# Patient Record
Sex: Female | Born: 1935
Health system: Southern US, Community
[De-identification: ages and names within clinical notes are randomized; demographics above are authoritative.]

## PROBLEM LIST (undated history)

## (undated) DIAGNOSIS — K449 Diaphragmatic hernia without obstruction or gangrene: Secondary | ICD-10-CM

## (undated) DIAGNOSIS — F0280 Dementia in other diseases classified elsewhere without behavioral disturbance: Secondary | ICD-10-CM

## (undated) DIAGNOSIS — E7800 Pure hypercholesterolemia, unspecified: Secondary | ICD-10-CM

## (undated) DIAGNOSIS — D649 Anemia, unspecified: Secondary | ICD-10-CM

## (undated) DIAGNOSIS — D693 Immune thrombocytopenic purpura: Secondary | ICD-10-CM

## (undated) DIAGNOSIS — M810 Age-related osteoporosis without current pathological fracture: Secondary | ICD-10-CM

## (undated) DIAGNOSIS — G8929 Other chronic pain: Secondary | ICD-10-CM

## (undated) DIAGNOSIS — K219 Gastro-esophageal reflux disease without esophagitis: Secondary | ICD-10-CM

## (undated) DIAGNOSIS — I447 Left bundle-branch block, unspecified: Secondary | ICD-10-CM

## (undated) DIAGNOSIS — E611 Iron deficiency: Secondary | ICD-10-CM

## (undated) DIAGNOSIS — M1990 Unspecified osteoarthritis, unspecified site: Secondary | ICD-10-CM

## (undated) DIAGNOSIS — I2699 Other pulmonary embolism without acute cor pulmonale: Secondary | ICD-10-CM

## (undated) DIAGNOSIS — F32A Depression, unspecified: Secondary | ICD-10-CM

## (undated) DIAGNOSIS — J449 Chronic obstructive pulmonary disease, unspecified: Secondary | ICD-10-CM

## (undated) DIAGNOSIS — F329 Major depressive disorder, single episode, unspecified: Secondary | ICD-10-CM

## (undated) DIAGNOSIS — F419 Anxiety disorder, unspecified: Secondary | ICD-10-CM

## (undated) DIAGNOSIS — G2581 Restless legs syndrome: Secondary | ICD-10-CM

## (undated) DIAGNOSIS — J45909 Unspecified asthma, uncomplicated: Secondary | ICD-10-CM

## (undated) DIAGNOSIS — M549 Dorsalgia, unspecified: Secondary | ICD-10-CM

## (undated) DIAGNOSIS — G309 Alzheimer's disease, unspecified: Secondary | ICD-10-CM

## (undated) DIAGNOSIS — E039 Hypothyroidism, unspecified: Secondary | ICD-10-CM

## (undated) DIAGNOSIS — R41841 Cognitive communication deficit: Secondary | ICD-10-CM

## (undated) HISTORY — PX: LUMBAR DISC SURGERY: SHX700

## (undated) HISTORY — DX: Dorsalgia, unspecified: M54.9

## (undated) HISTORY — DX: Restless legs syndrome: G25.81

## (undated) HISTORY — DX: Depression, unspecified: F32.A

## (undated) HISTORY — DX: Pure hypercholesterolemia, unspecified: E78.00

## (undated) HISTORY — DX: Unspecified osteoarthritis, unspecified site: M19.90

## (undated) HISTORY — PX: TOTAL ABDOMINAL HYSTERECTOMY: SHX209

## (undated) HISTORY — DX: Other pulmonary embolism without acute cor pulmonale: I26.99

## (undated) HISTORY — PX: CARPAL TUNNEL RELEASE: SHX101

## (undated) HISTORY — DX: Other chronic pain: G89.29

## (undated) HISTORY — DX: Immune thrombocytopenic purpura: D69.3

## (undated) HISTORY — DX: Anxiety disorder, unspecified: F41.9

## (undated) HISTORY — DX: Diaphragmatic hernia without obstruction or gangrene: K44.9

## (undated) HISTORY — DX: Anemia, unspecified: D64.9

## (undated) HISTORY — PX: SPLENECTOMY, PARTIAL: SHX787

## (undated) HISTORY — DX: Age-related osteoporosis without current pathological fracture: M81.0

## (undated) HISTORY — DX: Unspecified asthma, uncomplicated: J45.909

## (undated) HISTORY — DX: Gastro-esophageal reflux disease without esophagitis: K21.9

## (undated) HISTORY — DX: Major depressive disorder, single episode, unspecified: F32.9

---

## 2004-01-06 ENCOUNTER — Other Ambulatory Visit: Payer: Self-pay

## 2004-01-20 ENCOUNTER — Other Ambulatory Visit: Payer: Self-pay

## 2004-01-29 ENCOUNTER — Ambulatory Visit (HOSPITAL_COMMUNITY): Admission: RE | Admit: 2004-01-29 | Discharge: 2004-01-29 | Payer: Self-pay | Admitting: Allergy and Immunology

## 2004-07-12 ENCOUNTER — Ambulatory Visit: Payer: Self-pay | Admitting: Internal Medicine

## 2004-08-21 ENCOUNTER — Inpatient Hospital Stay (HOSPITAL_COMMUNITY): Admission: EM | Admit: 2004-08-21 | Discharge: 2004-08-27 | Payer: Self-pay | Admitting: Emergency Medicine

## 2004-09-03 ENCOUNTER — Ambulatory Visit: Payer: Self-pay | Admitting: Internal Medicine

## 2004-09-11 ENCOUNTER — Ambulatory Visit: Payer: Self-pay | Admitting: Internal Medicine

## 2004-10-07 ENCOUNTER — Ambulatory Visit: Payer: Self-pay | Admitting: Internal Medicine

## 2004-11-05 ENCOUNTER — Ambulatory Visit: Payer: Self-pay | Admitting: Internal Medicine

## 2004-11-12 ENCOUNTER — Ambulatory Visit: Payer: Self-pay | Admitting: Internal Medicine

## 2004-12-31 ENCOUNTER — Ambulatory Visit: Payer: Self-pay | Admitting: Internal Medicine

## 2005-01-10 ENCOUNTER — Ambulatory Visit: Payer: Self-pay | Admitting: Internal Medicine

## 2005-03-04 ENCOUNTER — Ambulatory Visit: Payer: Self-pay | Admitting: Internal Medicine

## 2005-03-12 ENCOUNTER — Ambulatory Visit: Payer: Self-pay | Admitting: Internal Medicine

## 2005-04-11 ENCOUNTER — Ambulatory Visit: Payer: Self-pay | Admitting: Internal Medicine

## 2005-05-28 ENCOUNTER — Encounter: Admission: RE | Admit: 2005-05-28 | Discharge: 2005-05-28 | Payer: Self-pay | Admitting: Allergy and Immunology

## 2005-07-06 ENCOUNTER — Ambulatory Visit: Payer: Self-pay | Admitting: Internal Medicine

## 2005-07-12 ENCOUNTER — Ambulatory Visit: Payer: Self-pay | Admitting: Internal Medicine

## 2005-09-02 ENCOUNTER — Ambulatory Visit: Payer: Self-pay | Admitting: Internal Medicine

## 2005-09-11 ENCOUNTER — Ambulatory Visit: Payer: Self-pay | Admitting: Internal Medicine

## 2005-11-04 ENCOUNTER — Ambulatory Visit: Payer: Self-pay | Admitting: Internal Medicine

## 2005-11-12 ENCOUNTER — Ambulatory Visit: Payer: Self-pay | Admitting: Internal Medicine

## 2005-12-10 ENCOUNTER — Ambulatory Visit: Payer: Self-pay | Admitting: Internal Medicine

## 2006-01-10 ENCOUNTER — Ambulatory Visit: Payer: Self-pay | Admitting: Internal Medicine

## 2006-02-09 ENCOUNTER — Ambulatory Visit: Payer: Self-pay | Admitting: Internal Medicine

## 2006-03-12 ENCOUNTER — Ambulatory Visit: Payer: Self-pay | Admitting: Internal Medicine

## 2006-04-01 ENCOUNTER — Ambulatory Visit: Payer: Self-pay | Admitting: Internal Medicine

## 2006-04-11 ENCOUNTER — Ambulatory Visit: Payer: Self-pay | Admitting: Internal Medicine

## 2006-04-30 ENCOUNTER — Emergency Department: Payer: Self-pay | Admitting: General Practice

## 2006-05-12 ENCOUNTER — Ambulatory Visit: Payer: Self-pay | Admitting: Internal Medicine

## 2006-05-27 ENCOUNTER — Inpatient Hospital Stay: Payer: Self-pay | Admitting: Internal Medicine

## 2006-06-03 ENCOUNTER — Ambulatory Visit: Payer: Self-pay | Admitting: General Surgery

## 2006-06-04 ENCOUNTER — Inpatient Hospital Stay: Payer: Self-pay | Admitting: Internal Medicine

## 2006-06-07 ENCOUNTER — Other Ambulatory Visit: Payer: Self-pay

## 2006-06-08 ENCOUNTER — Inpatient Hospital Stay: Payer: Self-pay | Admitting: Internal Medicine

## 2006-06-12 ENCOUNTER — Ambulatory Visit: Payer: Self-pay | Admitting: Internal Medicine

## 2006-07-07 ENCOUNTER — Ambulatory Visit: Payer: Self-pay

## 2006-07-12 ENCOUNTER — Ambulatory Visit: Payer: Self-pay | Admitting: Internal Medicine

## 2006-08-12 ENCOUNTER — Ambulatory Visit: Payer: Self-pay | Admitting: Internal Medicine

## 2006-09-09 ENCOUNTER — Emergency Department: Payer: Self-pay | Admitting: Emergency Medicine

## 2006-09-11 ENCOUNTER — Ambulatory Visit: Payer: Self-pay | Admitting: Internal Medicine

## 2006-09-16 ENCOUNTER — Inpatient Hospital Stay: Payer: Self-pay | Admitting: Unknown Physician Specialty

## 2006-10-12 ENCOUNTER — Ambulatory Visit: Payer: Self-pay | Admitting: Internal Medicine

## 2006-11-12 ENCOUNTER — Ambulatory Visit: Payer: Self-pay | Admitting: Internal Medicine

## 2006-12-11 ENCOUNTER — Ambulatory Visit: Payer: Self-pay | Admitting: Internal Medicine

## 2007-01-11 ENCOUNTER — Ambulatory Visit: Payer: Self-pay | Admitting: Internal Medicine

## 2007-01-14 ENCOUNTER — Ambulatory Visit: Payer: Self-pay | Admitting: Unknown Physician Specialty

## 2007-01-24 ENCOUNTER — Ambulatory Visit: Payer: Self-pay | Admitting: Unknown Physician Specialty

## 2007-01-24 ENCOUNTER — Other Ambulatory Visit: Payer: Self-pay

## 2007-02-01 ENCOUNTER — Ambulatory Visit: Payer: Self-pay | Admitting: Unknown Physician Specialty

## 2007-02-10 ENCOUNTER — Ambulatory Visit: Payer: Self-pay | Admitting: Internal Medicine

## 2007-03-13 ENCOUNTER — Ambulatory Visit: Payer: Self-pay | Admitting: Internal Medicine

## 2007-04-12 ENCOUNTER — Ambulatory Visit: Payer: Self-pay | Admitting: Internal Medicine

## 2007-04-19 ENCOUNTER — Ambulatory Visit: Payer: Self-pay | Admitting: Ophthalmology

## 2007-04-26 ENCOUNTER — Ambulatory Visit: Payer: Self-pay | Admitting: Ophthalmology

## 2007-05-13 ENCOUNTER — Ambulatory Visit: Payer: Self-pay | Admitting: Internal Medicine

## 2007-06-13 ENCOUNTER — Ambulatory Visit: Payer: Self-pay | Admitting: Internal Medicine

## 2007-07-13 ENCOUNTER — Ambulatory Visit: Payer: Self-pay | Admitting: Internal Medicine

## 2007-08-02 ENCOUNTER — Ambulatory Visit: Payer: Self-pay | Admitting: Internal Medicine

## 2007-08-12 ENCOUNTER — Other Ambulatory Visit: Payer: Self-pay

## 2007-08-12 ENCOUNTER — Emergency Department: Payer: Self-pay | Admitting: Emergency Medicine

## 2007-08-13 ENCOUNTER — Ambulatory Visit: Payer: Self-pay | Admitting: Internal Medicine

## 2007-09-03 ENCOUNTER — Ambulatory Visit: Payer: Self-pay | Admitting: Unknown Physician Specialty

## 2007-09-12 ENCOUNTER — Ambulatory Visit: Payer: Self-pay | Admitting: Internal Medicine

## 2007-10-13 ENCOUNTER — Ambulatory Visit: Payer: Self-pay | Admitting: Internal Medicine

## 2007-11-13 ENCOUNTER — Ambulatory Visit: Payer: Self-pay | Admitting: Internal Medicine

## 2007-12-11 ENCOUNTER — Ambulatory Visit: Payer: Self-pay | Admitting: Internal Medicine

## 2008-01-11 ENCOUNTER — Ambulatory Visit: Payer: Self-pay | Admitting: Internal Medicine

## 2008-02-10 ENCOUNTER — Ambulatory Visit: Payer: Self-pay | Admitting: Internal Medicine

## 2008-03-12 ENCOUNTER — Ambulatory Visit: Payer: Self-pay | Admitting: Internal Medicine

## 2008-03-21 ENCOUNTER — Ambulatory Visit: Payer: Self-pay

## 2008-03-27 ENCOUNTER — Ambulatory Visit: Payer: Self-pay | Admitting: Internal Medicine

## 2008-04-11 ENCOUNTER — Ambulatory Visit: Payer: Self-pay | Admitting: Internal Medicine

## 2008-05-29 ENCOUNTER — Ambulatory Visit: Payer: Self-pay | Admitting: Internal Medicine

## 2008-06-12 ENCOUNTER — Ambulatory Visit: Payer: Self-pay | Admitting: Internal Medicine

## 2008-06-15 ENCOUNTER — Ambulatory Visit: Payer: Self-pay | Admitting: General Practice

## 2008-06-29 ENCOUNTER — Ambulatory Visit: Payer: Self-pay | Admitting: General Practice

## 2008-07-31 ENCOUNTER — Ambulatory Visit: Payer: Self-pay | Admitting: Internal Medicine

## 2008-08-03 ENCOUNTER — Ambulatory Visit: Payer: Self-pay | Admitting: Internal Medicine

## 2008-08-12 ENCOUNTER — Ambulatory Visit: Payer: Self-pay | Admitting: Internal Medicine

## 2008-09-06 ENCOUNTER — Inpatient Hospital Stay: Payer: Self-pay | Admitting: Internal Medicine

## 2008-09-11 ENCOUNTER — Ambulatory Visit: Payer: Self-pay | Admitting: Internal Medicine

## 2008-11-12 ENCOUNTER — Ambulatory Visit: Payer: Self-pay | Admitting: Internal Medicine

## 2008-11-27 ENCOUNTER — Ambulatory Visit: Payer: Self-pay | Admitting: Internal Medicine

## 2008-12-10 ENCOUNTER — Ambulatory Visit: Payer: Self-pay | Admitting: Internal Medicine

## 2009-04-11 ENCOUNTER — Ambulatory Visit: Payer: Self-pay | Admitting: Internal Medicine

## 2009-05-02 ENCOUNTER — Ambulatory Visit: Payer: Self-pay | Admitting: Internal Medicine

## 2009-05-12 ENCOUNTER — Ambulatory Visit: Payer: Self-pay | Admitting: Internal Medicine

## 2009-08-05 ENCOUNTER — Ambulatory Visit: Payer: Self-pay | Admitting: Internal Medicine

## 2010-04-11 ENCOUNTER — Ambulatory Visit: Payer: Self-pay | Admitting: Internal Medicine

## 2010-04-21 ENCOUNTER — Ambulatory Visit: Payer: Self-pay | Admitting: Internal Medicine

## 2010-05-12 ENCOUNTER — Ambulatory Visit: Payer: Self-pay | Admitting: Internal Medicine

## 2010-06-12 ENCOUNTER — Ambulatory Visit: Payer: Self-pay | Admitting: Internal Medicine

## 2010-07-12 ENCOUNTER — Ambulatory Visit: Payer: Self-pay | Admitting: Internal Medicine

## 2010-08-12 ENCOUNTER — Ambulatory Visit: Payer: Self-pay | Admitting: Internal Medicine

## 2010-08-29 ENCOUNTER — Ambulatory Visit: Payer: Self-pay | Admitting: Internal Medicine

## 2010-09-11 ENCOUNTER — Ambulatory Visit: Payer: Self-pay | Admitting: Internal Medicine

## 2010-10-12 ENCOUNTER — Ambulatory Visit: Payer: Self-pay | Admitting: Internal Medicine

## 2010-11-12 ENCOUNTER — Ambulatory Visit: Payer: Self-pay | Admitting: Internal Medicine

## 2010-12-11 ENCOUNTER — Ambulatory Visit: Payer: Self-pay | Admitting: Internal Medicine

## 2011-01-11 ENCOUNTER — Ambulatory Visit: Payer: Self-pay | Admitting: Internal Medicine

## 2011-02-10 ENCOUNTER — Ambulatory Visit: Payer: Self-pay | Admitting: Internal Medicine

## 2011-03-13 ENCOUNTER — Ambulatory Visit: Payer: Self-pay | Admitting: Internal Medicine

## 2011-04-01 ENCOUNTER — Ambulatory Visit: Payer: Self-pay | Admitting: Ophthalmology

## 2011-04-12 ENCOUNTER — Ambulatory Visit: Payer: Self-pay | Admitting: Internal Medicine

## 2011-04-20 ENCOUNTER — Ambulatory Visit: Payer: Self-pay | Admitting: Ophthalmology

## 2011-05-13 ENCOUNTER — Ambulatory Visit: Payer: Self-pay | Admitting: Internal Medicine

## 2011-06-13 ENCOUNTER — Ambulatory Visit: Payer: Self-pay | Admitting: Internal Medicine

## 2011-07-13 ENCOUNTER — Ambulatory Visit: Payer: Self-pay | Admitting: Internal Medicine

## 2011-08-13 ENCOUNTER — Ambulatory Visit: Payer: Self-pay | Admitting: Internal Medicine

## 2011-08-31 ENCOUNTER — Ambulatory Visit: Payer: Self-pay | Admitting: Internal Medicine

## 2011-09-12 ENCOUNTER — Ambulatory Visit: Payer: Self-pay | Admitting: Internal Medicine

## 2011-10-13 ENCOUNTER — Ambulatory Visit: Payer: Self-pay | Admitting: Internal Medicine

## 2011-10-19 LAB — CBC CANCER CENTER
Basophil #: 0 x10 3/mm (ref 0.0–0.1)
Basophil %: 0.6 %
Eosinophil #: 0.1 x10 3/mm (ref 0.0–0.7)
Eosinophil %: 1.7 %
HCT: 44.7 % (ref 35.0–47.0)
HGB: 15.1 g/dL (ref 12.0–16.0)
Lymphocyte #: 3.2 x10 3/mm (ref 1.0–3.6)
Lymphocyte %: 47.4 %
MCH: 31.5 pg (ref 26.0–34.0)
MCHC: 33.7 g/dL (ref 32.0–36.0)
MCV: 93 fL (ref 80–100)
Monocyte #: 0.6 x10 3/mm (ref 0.0–0.7)
Monocyte %: 8.6 %
Neutrophil #: 2.8 x10 3/mm (ref 1.4–6.5)
Neutrophil %: 41.7 %
Platelet: 103 x10 3/mm — ABNORMAL LOW (ref 150–440)
RBC: 4.8 10*6/uL (ref 3.80–5.20)
RDW: 14.1 % (ref 11.5–14.5)
WBC: 6.7 x10 3/mm (ref 3.6–11.0)

## 2011-11-13 ENCOUNTER — Ambulatory Visit: Payer: Self-pay | Admitting: Internal Medicine

## 2011-11-23 LAB — CANCER CTR PLATELET CT: Platelet: 93 x10 3/mm — ABNORMAL LOW (ref 150–440)

## 2011-11-25 ENCOUNTER — Ambulatory Visit
Admission: RE | Admit: 2011-11-25 | Discharge: 2011-11-25 | Disposition: A | Discharge status: Home / Self Care | Payer: Medicare Other | Source: Ambulatory Visit | Attending: Pediatrics | Admitting: Pediatrics

## 2011-11-25 ENCOUNTER — Other Ambulatory Visit: Payer: Self-pay | Admitting: Pediatrics

## 2011-12-07 LAB — CANCER CTR PLATELET CT: Platelet: 91 x10 3/mm — ABNORMAL LOW (ref 150–440)

## 2011-12-08 ENCOUNTER — Ambulatory Visit: Payer: Self-pay | Admitting: Cardiology

## 2011-12-11 ENCOUNTER — Ambulatory Visit: Payer: Self-pay | Admitting: Internal Medicine

## 2011-12-15 LAB — CANCER CTR PLATELET CT: Platelet: 104 x10 3/mm — ABNORMAL LOW (ref 150–440)

## 2012-01-11 ENCOUNTER — Ambulatory Visit: Payer: Self-pay | Admitting: Internal Medicine

## 2012-01-18 LAB — CANCER CTR PLATELET CT: Platelet: 44 x10 3/mm — ABNORMAL LOW (ref 150–440)

## 2012-01-19 LAB — CBC CANCER CENTER
Basophil #: 0.1 x10 3/mm (ref 0.0–0.1)
Basophil %: 0.6 %
Eosinophil #: 0 x10 3/mm (ref 0.0–0.7)
Eosinophil %: 0 %
HCT: 42.2 % (ref 35.0–47.0)
HGB: 14.1 g/dL (ref 12.0–16.0)
Lymphocyte #: 1.8 x10 3/mm (ref 1.0–3.6)
Lymphocyte %: 13.1 %
MCH: 31 pg (ref 26.0–34.0)
MCHC: 33.5 g/dL (ref 32.0–36.0)
MCV: 92 fL (ref 80–100)
Monocyte #: 0.5 x10 3/mm (ref 0.0–0.7)
Monocyte %: 3.3 %
Neutrophil #: 11.7 x10 3/mm — ABNORMAL HIGH (ref 1.4–6.5)
Neutrophil %: 83 %
Platelet: 56 x10 3/mm — ABNORMAL LOW (ref 150–440)
RBC: 4.56 10*6/uL (ref 3.80–5.20)
RDW: 14.7 % — ABNORMAL HIGH (ref 11.5–14.5)
WBC: 14.1 x10 3/mm — ABNORMAL HIGH (ref 3.6–11.0)

## 2012-01-21 LAB — CANCER CTR PLATELET CT: Platelet: 53 x10 3/mm — ABNORMAL LOW (ref 150–440)

## 2012-01-25 LAB — PLATELET COUNT: Platelet: 64 10*3/uL — ABNORMAL LOW (ref 150–440)

## 2012-01-28 LAB — PLATELET COUNT: Platelet: 87 10*3/uL — ABNORMAL LOW (ref 150–440)

## 2012-02-03 LAB — PLATELET COUNT: Platelet: 113 10*3/uL — ABNORMAL LOW (ref 150–440)

## 2012-02-10 ENCOUNTER — Ambulatory Visit: Payer: Self-pay | Admitting: Internal Medicine

## 2012-02-15 LAB — CANCER CTR PLATELET CT: Platelet: 80 x10 3/mm — ABNORMAL LOW (ref 150–440)

## 2012-03-09 LAB — CBC CANCER CENTER
Basophil #: 0.1 x10 3/mm (ref 0.0–0.1)
Basophil %: 1.5 %
Eosinophil #: 0.2 x10 3/mm (ref 0.0–0.7)
Eosinophil %: 4 %
HCT: 45.4 % (ref 35.0–47.0)
HGB: 14.8 g/dL (ref 12.0–16.0)
Lymphocyte #: 2.5 x10 3/mm (ref 1.0–3.6)
Lymphocyte %: 45.2 %
MCH: 30.4 pg (ref 26.0–34.0)
MCHC: 32.7 g/dL (ref 32.0–36.0)
MCV: 93 fL (ref 80–100)
Monocyte #: 0.5 x10 3/mm (ref 0.2–0.9)
Monocyte %: 9.7 %
Neutrophil #: 2.2 x10 3/mm (ref 1.4–6.5)
Neutrophil %: 39.6 %
Platelet: 90 x10 3/mm — ABNORMAL LOW (ref 150–440)
RBC: 4.89 10*6/uL (ref 3.80–5.20)
RDW: 14.2 % (ref 11.5–14.5)
WBC: 5.5 x10 3/mm (ref 3.6–11.0)

## 2012-03-12 ENCOUNTER — Ambulatory Visit: Payer: Self-pay | Admitting: Internal Medicine

## 2012-04-27 ENCOUNTER — Ambulatory Visit: Payer: Self-pay | Admitting: Internal Medicine

## 2012-04-27 LAB — CBC CANCER CENTER
Basophil #: 0.1 x10 3/mm (ref 0.0–0.1)
Basophil %: 0.9 %
Eosinophil #: 0.3 x10 3/mm (ref 0.0–0.7)
Eosinophil %: 3.6 %
HCT: 42.3 % (ref 35.0–47.0)
HGB: 13.5 g/dL (ref 12.0–16.0)
Lymphocyte #: 4.2 x10 3/mm — ABNORMAL HIGH (ref 1.0–3.6)
Lymphocyte %: 45.3 %
MCH: 29.8 pg (ref 26.0–34.0)
MCHC: 32 g/dL (ref 32.0–36.0)
MCV: 93 fL (ref 80–100)
Monocyte #: 0.7 x10 3/mm (ref 0.2–0.9)
Monocyte %: 7 %
Neutrophil #: 4 x10 3/mm (ref 1.4–6.5)
Neutrophil %: 43.2 %
Platelet: 96 x10 3/mm — ABNORMAL LOW (ref 150–440)
RBC: 4.54 10*6/uL (ref 3.80–5.20)
RDW: 14.4 % (ref 11.5–14.5)
WBC: 9.4 x10 3/mm (ref 3.6–11.0)

## 2012-05-11 LAB — CBC CANCER CENTER
Basophil #: 0.1 x10 3/mm (ref 0.0–0.1)
Basophil %: 0.6 %
Eosinophil #: 0.2 x10 3/mm (ref 0.0–0.7)
Eosinophil %: 1.6 %
HCT: 46 % (ref 35.0–47.0)
HGB: 15.3 g/dL (ref 12.0–16.0)
Lymphocyte #: 2.3 x10 3/mm (ref 1.0–3.6)
Lymphocyte %: 17.6 %
MCH: 31.1 pg (ref 26.0–34.0)
MCHC: 33.3 g/dL (ref 32.0–36.0)
MCV: 93 fL (ref 80–100)
Monocyte #: 0.7 x10 3/mm (ref 0.2–0.9)
Monocyte %: 5.4 %
Neutrophil #: 9.7 x10 3/mm — ABNORMAL HIGH (ref 1.4–6.5)
Neutrophil %: 74.8 %
Platelet: 71 x10 3/mm — ABNORMAL LOW (ref 150–440)
RBC: 4.93 10*6/uL (ref 3.80–5.20)
RDW: 14.8 % — ABNORMAL HIGH (ref 11.5–14.5)
WBC: 13 x10 3/mm — ABNORMAL HIGH (ref 3.6–11.0)

## 2012-05-12 ENCOUNTER — Ambulatory Visit: Payer: Self-pay | Admitting: Internal Medicine

## 2012-05-25 LAB — CANCER CTR PLATELET CT: Platelet: 114 x10 3/mm — ABNORMAL LOW (ref 150–440)

## 2012-06-12 ENCOUNTER — Ambulatory Visit: Payer: Self-pay | Admitting: Internal Medicine

## 2012-06-22 LAB — CANCER CENTER WBC: WBC: 8.7 x10 3/mm (ref 3.6–11.0)

## 2012-06-22 LAB — CANCER CTR PLATELET CT: Platelet: 57 x10 3/mm — ABNORMAL LOW (ref 150–440)

## 2012-07-12 ENCOUNTER — Ambulatory Visit: Payer: Self-pay | Admitting: Internal Medicine

## 2012-07-20 LAB — CANCER CTR PLATELET CT: Platelet: 72 x10 3/mm — ABNORMAL LOW (ref 150–440)

## 2012-08-12 ENCOUNTER — Ambulatory Visit: Payer: Self-pay | Admitting: Internal Medicine

## 2012-08-17 LAB — CANCER CTR PLATELET CT: Platelet: 66 x10 3/mm — ABNORMAL LOW (ref 150–440)

## 2012-08-31 ENCOUNTER — Ambulatory Visit: Payer: Self-pay | Admitting: Internal Medicine

## 2012-08-31 LAB — CANCER CTR PLATELET CT: Platelet: 91 x10 3/mm — ABNORMAL LOW (ref 150–440)

## 2012-09-11 ENCOUNTER — Ambulatory Visit: Payer: Self-pay | Admitting: Internal Medicine

## 2012-10-12 ENCOUNTER — Ambulatory Visit: Payer: Self-pay | Admitting: Internal Medicine

## 2012-11-12 ENCOUNTER — Ambulatory Visit: Payer: Self-pay | Admitting: Internal Medicine

## 2012-11-23 LAB — CBC CANCER CENTER
Basophil #: 0 x10 3/mm (ref 0.0–0.1)
Basophil %: 0.6 %
Eosinophil #: 0.1 x10 3/mm (ref 0.0–0.7)
Eosinophil %: 1.6 %
HCT: 44.8 % (ref 35.0–47.0)
HGB: 15 g/dL (ref 12.0–16.0)
Lymphocyte #: 3.6 x10 3/mm (ref 1.0–3.6)
Lymphocyte %: 47.3 %
MCH: 31.1 pg (ref 26.0–34.0)
MCHC: 33.5 g/dL (ref 32.0–36.0)
MCV: 93 fL (ref 80–100)
Monocyte #: 0.7 x10 3/mm (ref 0.2–0.9)
Monocyte %: 9.2 %
Neutrophil #: 3.1 x10 3/mm (ref 1.4–6.5)
Neutrophil %: 41.3 %
Platelet: 68 x10 3/mm — ABNORMAL LOW (ref 150–440)
RBC: 4.82 10*6/uL (ref 3.80–5.20)
RDW: 13.6 % (ref 11.5–14.5)
WBC: 7.6 x10 3/mm (ref 3.6–11.0)

## 2012-12-10 ENCOUNTER — Ambulatory Visit: Payer: Self-pay | Admitting: Internal Medicine

## 2012-12-26 LAB — CANCER CTR PLATELET CT: Platelet: 80 x10 3/mm — ABNORMAL LOW (ref 150–440)

## 2013-01-10 ENCOUNTER — Ambulatory Visit: Payer: Self-pay | Admitting: Internal Medicine

## 2013-01-13 ENCOUNTER — Emergency Department: Payer: Self-pay | Admitting: Emergency Medicine

## 2013-01-16 ENCOUNTER — Emergency Department: Payer: Self-pay | Admitting: Emergency Medicine

## 2013-01-18 LAB — CANCER CTR PLATELET CT: Platelet: 56 x10 3/mm — ABNORMAL LOW (ref 150–440)

## 2013-01-23 LAB — CANCER CTR PLATELET CT: Platelet: 104 x10 3/mm — ABNORMAL LOW (ref 150–440)

## 2013-02-09 ENCOUNTER — Ambulatory Visit: Payer: Self-pay | Admitting: Internal Medicine

## 2013-02-15 LAB — CANCER CTR PLATELET CT: Platelet: 42 x10 3/mm — ABNORMAL LOW (ref 150–440)

## 2013-02-17 LAB — CANCER CTR PLATELET CT: Platelet: 41 x10 3/mm — ABNORMAL LOW (ref 150–440)

## 2013-02-20 LAB — CBC CANCER CENTER
Basophil #: 0.1 x10 3/mm (ref 0.0–0.1)
Basophil %: 0.5 %
Eosinophil #: 0.2 x10 3/mm (ref 0.0–0.7)
Eosinophil %: 1.7 %
HCT: 38.6 % (ref 35.0–47.0)
HGB: 12.8 g/dL (ref 12.0–16.0)
Lymphocyte #: 4 x10 3/mm — ABNORMAL HIGH (ref 1.0–3.6)
Lymphocyte %: 37.7 %
MCH: 30.8 pg (ref 26.0–34.0)
MCHC: 33.3 g/dL (ref 32.0–36.0)
MCV: 93 fL (ref 80–100)
Monocyte #: 1 x10 3/mm — ABNORMAL HIGH (ref 0.2–0.9)
Monocyte %: 9 %
Neutrophil #: 5.5 x10 3/mm (ref 1.4–6.5)
Neutrophil %: 51.1 %
Platelet: 35 x10 3/mm — ABNORMAL LOW (ref 150–440)
RBC: 4.16 10*6/uL (ref 3.80–5.20)
RDW: 14 % (ref 11.5–14.5)
WBC: 10.7 x10 3/mm (ref 3.6–11.0)

## 2013-02-22 LAB — CBC CANCER CENTER
Basophil #: 0.1 x10 3/mm (ref 0.0–0.1)
Basophil %: 1.2 %
Eosinophil #: 0.2 x10 3/mm (ref 0.0–0.7)
Eosinophil %: 2.4 %
HCT: 42.4 % (ref 35.0–47.0)
HGB: 14.1 g/dL (ref 12.0–16.0)
Lymphocyte #: 2.5 x10 3/mm (ref 1.0–3.6)
Lymphocyte %: 38.3 %
MCH: 31 pg (ref 26.0–34.0)
MCHC: 33.3 g/dL (ref 32.0–36.0)
MCV: 93 fL (ref 80–100)
Monocyte #: 0.5 x10 3/mm (ref 0.2–0.9)
Monocyte %: 7 %
Neutrophil #: 3.3 x10 3/mm (ref 1.4–6.5)
Neutrophil %: 51.1 %
Platelet: 54 x10 3/mm — ABNORMAL LOW (ref 150–440)
RBC: 4.56 10*6/uL (ref 3.80–5.20)
RDW: 13.9 % (ref 11.5–14.5)
WBC: 6.5 x10 3/mm (ref 3.6–11.0)

## 2013-02-22 LAB — CREATININE, SERUM
Creatinine: 0.95 mg/dL (ref 0.60–1.30)
EGFR (African American): >60
EGFR (Non-African Amer.): 58 — ABNORMAL LOW

## 2013-02-27 LAB — CANCER CTR PLATELET CT: Platelet: 70 x10 3/mm — ABNORMAL LOW (ref 150–440)

## 2013-03-12 ENCOUNTER — Ambulatory Visit: Payer: Self-pay | Admitting: Internal Medicine

## 2013-03-15 LAB — CANCER CTR PLATELET CT: Platelet: 52 x10 3/mm — ABNORMAL LOW (ref 150–440)

## 2013-04-11 ENCOUNTER — Ambulatory Visit: Payer: Self-pay | Admitting: Internal Medicine

## 2013-04-12 LAB — CANCER CTR PLATELET CT: Platelet: 61 x10 3/mm — ABNORMAL LOW (ref 150–440)

## 2013-05-12 ENCOUNTER — Ambulatory Visit: Payer: Self-pay | Admitting: Internal Medicine

## 2013-05-17 LAB — CANCER CTR PLATELET CT: Platelet: 43 x10 3/mm — ABNORMAL LOW (ref 150–440)

## 2013-05-19 LAB — CANCER CTR PLATELET CT: Platelet: 48 x10 3/mm — ABNORMAL LOW (ref 150–440)

## 2013-05-24 LAB — CANCER CTR PLATELET CT: Platelet: 61 x10 3/mm — ABNORMAL LOW (ref 150–440)

## 2013-06-07 LAB — CANCER CTR PLATELET CT: Platelet: 49 x10 3/mm — ABNORMAL LOW (ref 150–440)

## 2013-06-12 ENCOUNTER — Ambulatory Visit: Payer: Self-pay | Admitting: Internal Medicine

## 2013-06-21 LAB — CANCER CTR PLATELET CT: Platelet: 48 x10 3/mm — ABNORMAL LOW (ref 150–440)

## 2013-07-12 ENCOUNTER — Ambulatory Visit: Payer: Self-pay | Admitting: Internal Medicine

## 2013-07-18 LAB — CANCER CTR PLATELET CT: Platelet: 53 x10 3/mm — ABNORMAL LOW (ref 150–440)

## 2013-08-12 ENCOUNTER — Ambulatory Visit: Payer: Self-pay | Admitting: Internal Medicine

## 2013-08-16 LAB — CBC CANCER CENTER
Basophil #: 0.1 x10 3/mm (ref 0.0–0.1)
Basophil %: 0.8 %
Eosinophil #: 0.1 x10 3/mm (ref 0.0–0.7)
Eosinophil %: 2 %
HCT: 42 % (ref 35.0–47.0)
HGB: 13.7 g/dL (ref 12.0–16.0)
Lymphocyte #: 3.1 x10 3/mm (ref 1.0–3.6)
Lymphocyte %: 43 %
MCH: 30.1 pg (ref 26.0–34.0)
MCHC: 32.5 g/dL (ref 32.0–36.0)
MCV: 92 fL (ref 80–100)
Monocyte #: 0.6 x10 3/mm (ref 0.2–0.9)
Monocyte %: 8.7 %
Neutrophil #: 3.3 x10 3/mm (ref 1.4–6.5)
Neutrophil %: 45.5 %
Platelet: 53 x10 3/mm — ABNORMAL LOW (ref 150–440)
RBC: 4.55 10*6/uL (ref 3.80–5.20)
RDW: 14 % (ref 11.5–14.5)
WBC: 7.2 x10 3/mm (ref 3.6–11.0)

## 2013-09-01 ENCOUNTER — Ambulatory Visit: Payer: Self-pay | Admitting: Family Medicine

## 2013-09-11 ENCOUNTER — Ambulatory Visit: Payer: Self-pay | Admitting: Internal Medicine

## 2013-09-13 LAB — CANCER CTR PLATELET CT: Platelet: 61 x10 3/mm — ABNORMAL LOW (ref 150–440)

## 2013-10-12 ENCOUNTER — Ambulatory Visit: Payer: Self-pay | Admitting: Internal Medicine

## 2013-10-18 LAB — CANCER CTR PLATELET CT: Platelet: 32 x10 3/mm — ABNORMAL LOW (ref 150–440)

## 2013-10-20 LAB — CANCER CTR PLATELET CT: Platelet: 43 x10 3/mm — ABNORMAL LOW (ref 150–440)

## 2013-10-24 LAB — CBC CANCER CENTER
BASOS PCT: 0.5 %
Basophil #: 0.1 x10 3/mm (ref 0.0–0.1)
EOS PCT: 0.7 %
Eosinophil #: 0.1 x10 3/mm (ref 0.0–0.7)
HCT: 39.9 % (ref 35.0–47.0)
HGB: 13 g/dL (ref 12.0–16.0)
LYMPHS ABS: 6.2 x10 3/mm — AB (ref 1.0–3.6)
LYMPHS PCT: 36.9 %
MCH: 29.9 pg (ref 26.0–34.0)
MCHC: 32.7 g/dL (ref 32.0–36.0)
MCV: 92 fL (ref 80–100)
Monocyte #: 1.2 x10 3/mm — ABNORMAL HIGH (ref 0.2–0.9)
Monocyte %: 7.1 %
NEUTROS ABS: 9.2 x10 3/mm — AB (ref 1.4–6.5)
Neutrophil %: 54.8 %
Platelet: 35 x10 3/mm — ABNORMAL LOW (ref 150–440)
RBC: 4.36 10*6/uL (ref 3.80–5.20)
RDW: 14.2 % (ref 11.5–14.5)
WBC: 16.7 x10 3/mm — AB (ref 3.6–11.0)

## 2013-10-30 LAB — CANCER CTR PLATELET CT: Platelet: 30 x10 3/mm — CL (ref 150–440)

## 2013-11-01 LAB — CREATININE, SERUM
Creatinine: 0.89 mg/dL (ref 0.60–1.30)
EGFR (African American): >60
EGFR (Non-African Amer.): >60

## 2013-11-01 LAB — CANCER CTR PLATELET CT: PLATELETS: 39 x10 3/mm — AB (ref 150–440)

## 2013-11-06 LAB — CANCER CTR PLATELET CT: Platelet: 42 x10 3/mm — ABNORMAL LOW (ref 150–440)

## 2013-11-12 ENCOUNTER — Ambulatory Visit: Payer: Self-pay | Admitting: Internal Medicine

## 2013-11-13 LAB — CANCER CTR PLATELET CT: Platelet: 42 x10 3/mm — ABNORMAL LOW (ref 150–440)

## 2013-12-01 LAB — CANCER CTR PLATELET CT: Platelet: 64 x10 3/mm — ABNORMAL LOW (ref 150–440)

## 2013-12-10 ENCOUNTER — Ambulatory Visit: Payer: Self-pay | Admitting: Internal Medicine

## 2013-12-20 ENCOUNTER — Ambulatory Visit: Payer: Self-pay | Admitting: Physical Medicine and Rehabilitation

## 2013-12-29 LAB — CANCER CTR PLATELET CT: Platelet: 80 x10 3/mm — ABNORMAL LOW (ref 150–440)

## 2014-01-10 ENCOUNTER — Ambulatory Visit: Payer: Self-pay | Admitting: Internal Medicine

## 2014-01-26 LAB — CANCER CTR PLATELET CT: Platelet: 38 x10 3/mm — ABNORMAL LOW (ref 150–440)

## 2014-01-29 LAB — CANCER CTR PLATELET CT: PLATELETS: 45 x10 3/mm — AB (ref 150–440)

## 2014-02-09 ENCOUNTER — Ambulatory Visit: Payer: Self-pay | Admitting: Internal Medicine

## 2014-02-22 LAB — CANCER CTR PLATELET CT: Platelet: 46 x10 3/mm — ABNORMAL LOW (ref 150–440)

## 2014-02-22 LAB — CANCER CENTER HEMOGLOBIN: HGB: 13.7 g/dL (ref 12.0–16.0)

## 2014-02-23 ENCOUNTER — Emergency Department: Payer: Self-pay | Admitting: Emergency Medicine

## 2014-02-23 LAB — CBC WITH DIFFERENTIAL/PLATELET
BASOS ABS: 0.1 10*3/uL (ref 0.0–0.1)
Basophil %: 0.6 %
Eosinophil #: 0.2 10*3/uL (ref 0.0–0.7)
Eosinophil %: 1.7 %
HCT: 43.7 % (ref 35.0–47.0)
HGB: 13.9 g/dL (ref 12.0–16.0)
LYMPHS ABS: 3 10*3/uL (ref 1.0–3.6)
Lymphocyte %: 28.6 %
MCH: 29.8 pg (ref 26.0–34.0)
MCHC: 31.9 g/dL — ABNORMAL LOW (ref 32.0–36.0)
MCV: 94 fL (ref 80–100)
Monocyte #: 1 x10 3/mm — ABNORMAL HIGH (ref 0.2–0.9)
Monocyte %: 9.9 %
NEUTROS PCT: 59.2 %
Neutrophil #: 6.2 10*3/uL (ref 1.4–6.5)
PLATELETS: 34 10*3/uL — AB (ref 150–440)
RBC: 4.67 10*6/uL (ref 3.80–5.20)
RDW: 14.4 % (ref 11.5–14.5)
WBC: 10.5 10*3/uL (ref 3.6–11.0)

## 2014-02-23 LAB — BASIC METABOLIC PANEL
ANION GAP: 6 — AB (ref 7–16)
BUN: 28 mg/dL — AB (ref 7–18)
CO2: 31 mmol/L (ref 21–32)
CREATININE: 1.3 mg/dL (ref 0.60–1.30)
Calcium, Total: 9.4 mg/dL (ref 8.5–10.1)
Chloride: 104 mmol/L (ref 98–107)
EGFR (African American): 46 — ABNORMAL LOW
EGFR (Non-African Amer.): 39 — ABNORMAL LOW
GLUCOSE: 83 mg/dL (ref 65–99)
Osmolality: 286 (ref 275–301)
Potassium: 4.3 mmol/L (ref 3.5–5.1)
SODIUM: 141 mmol/L (ref 136–145)

## 2014-02-23 LAB — TROPONIN I: Troponin-I: <0.02 ng/mL

## 2014-02-24 LAB — URINALYSIS, COMPLETE
BLOOD: NEGATIVE
Bacteria: NONE SEEN
Bilirubin,UR: NEGATIVE
Glucose,UR: NEGATIVE mg/dL (ref 0–75)
Hyaline Cast: 2
Ketone: NEGATIVE
Leukocyte Esterase: NEGATIVE
Nitrite: NEGATIVE
Ph: 6 (ref 4.5–8.0)
Protein: NEGATIVE
RBC, UR: NONE SEEN /HPF (ref 0–5)
Specific Gravity: 1.011 (ref 1.003–1.030)
Squamous Epithelial: NONE SEEN
WBC UR: <1 /HPF (ref 0–5)

## 2014-02-27 DIAGNOSIS — M5136 Other intervertebral disc degeneration, lumbar region: Secondary | ICD-10-CM | POA: Insufficient documentation

## 2014-02-27 DIAGNOSIS — M5416 Radiculopathy, lumbar region: Secondary | ICD-10-CM | POA: Insufficient documentation

## 2014-02-27 DIAGNOSIS — M5116 Intervertebral disc disorders with radiculopathy, lumbar region: Secondary | ICD-10-CM | POA: Insufficient documentation

## 2014-03-12 ENCOUNTER — Ambulatory Visit: Payer: Self-pay | Admitting: Internal Medicine

## 2014-03-26 LAB — PLATELET COUNT: Platelet: 48 10*3/uL — ABNORMAL LOW (ref 150–440)

## 2014-03-27 DIAGNOSIS — M7060 Trochanteric bursitis, unspecified hip: Secondary | ICD-10-CM | POA: Insufficient documentation

## 2014-04-11 ENCOUNTER — Ambulatory Visit: Payer: Self-pay | Admitting: Internal Medicine

## 2014-04-25 LAB — PLATELET COUNT: Platelet: 28 10*3/uL — CL (ref 150–440)

## 2014-04-27 LAB — CANCER CTR PLATELET CT: PLATELETS: 32 x10 3/mm — AB (ref 150–440)

## 2014-05-02 LAB — CANCER CTR PLATELET CT: Platelet: 31 x10 3/mm — ABNORMAL LOW (ref 150–440)

## 2014-05-12 ENCOUNTER — Ambulatory Visit: Payer: Self-pay | Admitting: Internal Medicine

## 2014-05-15 LAB — CBC CANCER CENTER
Basophil #: 0.1 x10 3/mm (ref 0.0–0.1)
Basophil %: 0.8 %
Eosinophil #: 0.1 x10 3/mm (ref 0.0–0.7)
Eosinophil %: 1.7 %
HCT: 40.8 % (ref 35.0–47.0)
HGB: 13.3 g/dL (ref 12.0–16.0)
LYMPHS ABS: 2.6 x10 3/mm (ref 1.0–3.6)
Lymphocyte %: 29.1 %
MCH: 30.6 pg (ref 26.0–34.0)
MCHC: 32.6 g/dL (ref 32.0–36.0)
MCV: 94 fL (ref 80–100)
MONOS PCT: 7.8 %
Monocyte #: 0.7 x10 3/mm (ref 0.2–0.9)
Neutrophil #: 5.4 x10 3/mm (ref 1.4–6.5)
Neutrophil %: 60.6 %
Platelet: 25 x10 3/mm — CL (ref 150–440)
RBC: 4.35 10*6/uL (ref 3.80–5.20)
RDW: 14.9 % — ABNORMAL HIGH (ref 11.5–14.5)
WBC: 8.8 x10 3/mm (ref 3.6–11.0)

## 2014-05-15 LAB — CREATININE, SERUM
Creatinine: 0.94 mg/dL (ref 0.60–1.30)
EGFR (African American): >60
GFR CALC NON AF AMER: 58 — AB

## 2014-05-16 ENCOUNTER — Ambulatory Visit: Payer: Self-pay | Admitting: Neurology

## 2014-05-16 LAB — CANCER CTR PLATELET CT: PLATELETS: 29 x10 3/mm — AB (ref 150–440)

## 2014-05-17 LAB — CANCER CTR PLATELET CT: Platelet: 17 x10 3/mm — CL (ref 150–440)

## 2014-05-18 LAB — CANCER CTR PLATELET CT: Platelet: 17 x10 3/mm — CL (ref 150–440)

## 2014-05-18 LAB — CREATININE, SERUM
Creatinine: 1.02 mg/dL (ref 0.60–1.30)
EGFR (Non-African Amer.): 53 — ABNORMAL LOW

## 2014-05-21 LAB — COMPREHENSIVE METABOLIC PANEL
ANION GAP: 5 — AB (ref 7–16)
AST: 20 U/L (ref 15–37)
Albumin: 3.3 g/dL — ABNORMAL LOW (ref 3.4–5.0)
Alkaline Phosphatase: 77 U/L
BUN: 22 mg/dL — ABNORMAL HIGH (ref 7–18)
Bilirubin,Total: 0.3 mg/dL (ref 0.2–1.0)
Calcium, Total: 8.7 mg/dL (ref 8.5–10.1)
Chloride: 103 mmol/L (ref 98–107)
Co2: 32 mmol/L (ref 21–32)
Creatinine: 0.94 mg/dL (ref 0.60–1.30)
GFR CALC NON AF AMER: 58 — AB
Glucose: 73 mg/dL (ref 65–99)
OSMOLALITY: 281 (ref 275–301)
Potassium: 3.7 mmol/L (ref 3.5–5.1)
SGPT (ALT): 39 U/L
Sodium: 140 mmol/L (ref 136–145)
Total Protein: 6.5 g/dL (ref 6.4–8.2)

## 2014-05-21 LAB — CANCER CTR PLATELET CT: Platelet: 17 x10 3/mm — CL (ref 150–440)

## 2014-05-23 LAB — CBC CANCER CENTER
BASOS ABS: 0.1 x10 3/mm (ref 0.0–0.1)
Basophil %: 0.7 %
EOS PCT: 1 %
Eosinophil #: 0.2 x10 3/mm (ref 0.0–0.7)
HCT: 42.7 % (ref 35.0–47.0)
HGB: 13.7 g/dL (ref 12.0–16.0)
Lymphocyte #: 5.6 x10 3/mm — ABNORMAL HIGH (ref 1.0–3.6)
Lymphocyte %: 33.2 %
MCH: 30.1 pg (ref 26.0–34.0)
MCHC: 32 g/dL (ref 32.0–36.0)
MCV: 94 fL (ref 80–100)
MONO ABS: 1.2 x10 3/mm — AB (ref 0.2–0.9)
Monocyte %: 6.9 %
NEUTROS ABS: 9.8 x10 3/mm — AB (ref 1.4–6.5)
NEUTROS PCT: 58.2 %
Platelet: 17 x10 3/mm — CL (ref 150–440)
RBC: 4.54 10*6/uL (ref 3.80–5.20)
RDW: 14.3 % (ref 11.5–14.5)
WBC: 16.8 x10 3/mm — ABNORMAL HIGH (ref 3.6–11.0)

## 2014-05-25 LAB — CANCER CTR PLATELET CT: Platelet: 23 x10 3/mm — CL (ref 150–440)

## 2014-05-28 LAB — PLATELET COUNT: Platelet: 27 10*3/uL — CL (ref 150–440)

## 2014-05-30 LAB — URINALYSIS, COMPLETE
BACTERIA: NONE SEEN
BLOOD: NEGATIVE
Bilirubin,UR: NEGATIVE
Glucose,UR: NEGATIVE mg/dL (ref 0–75)
Ketone: NEGATIVE
Leukocyte Esterase: NEGATIVE
Nitrite: NEGATIVE
PROTEIN: NEGATIVE
Ph: 6 (ref 4.5–8.0)
RBC,UR: <1 /HPF (ref 0–5)
SQUAMOUS EPITHELIAL: NONE SEEN
Specific Gravity: 1.006 (ref 1.003–1.030)
WBC UR: <1 /HPF (ref 0–5)

## 2014-05-30 LAB — CANCER CTR PLATELET CT: Platelet: 29 x10 3/mm — CL (ref 150–440)

## 2014-06-01 LAB — HEPATIC FUNCTION PANEL A (ARMC)
ALK PHOS: 93 U/L
ALT: 76 U/L — AB
Albumin: 3.4 g/dL (ref 3.4–5.0)
Bilirubin, Direct: <0.1 mg/dL (ref 0.00–0.20)
Bilirubin,Total: 0.3 mg/dL (ref 0.2–1.0)
SGOT(AST): 31 U/L (ref 15–37)
TOTAL PROTEIN: 6.5 g/dL (ref 6.4–8.2)

## 2014-06-01 LAB — CBC CANCER CENTER
BASOS ABS: 0.1 x10 3/mm (ref 0.0–0.1)
BASOS PCT: 1 %
Eosinophil #: 0.1 x10 3/mm (ref 0.0–0.7)
Eosinophil %: 0.8 %
HCT: 41.5 % (ref 35.0–47.0)
HGB: 13.5 g/dL (ref 12.0–16.0)
LYMPHS ABS: 2.4 x10 3/mm (ref 1.0–3.6)
Lymphocyte %: 28.1 %
MCH: 30.7 pg (ref 26.0–34.0)
MCHC: 32.5 g/dL (ref 32.0–36.0)
MCV: 94 fL (ref 80–100)
MONO ABS: 0.8 x10 3/mm (ref 0.2–0.9)
Monocyte %: 9.2 %
NEUTROS ABS: 5.2 x10 3/mm (ref 1.4–6.5)
Neutrophil %: 60.9 %
Platelet: 36 x10 3/mm — ABNORMAL LOW (ref 150–440)
RBC: 4.4 10*6/uL (ref 3.80–5.20)
RDW: 14.7 % — ABNORMAL HIGH (ref 11.5–14.5)
WBC: 8.6 x10 3/mm (ref 3.6–11.0)

## 2014-06-01 LAB — MAGNESIUM: MAGNESIUM: 1.8 mg/dL

## 2014-06-01 LAB — CREATININE, SERUM
Creatinine: 1.01 mg/dL (ref 0.60–1.30)
EGFR (Non-African Amer.): 53 — ABNORMAL LOW

## 2014-06-01 LAB — POTASSIUM: POTASSIUM: 3.9 mmol/L (ref 3.5–5.1)

## 2014-06-01 LAB — TSH: THYROID STIMULATING HORM: 2.49 u[IU]/mL

## 2014-06-04 DIAGNOSIS — F419 Anxiety disorder, unspecified: Secondary | ICD-10-CM

## 2014-06-04 DIAGNOSIS — F329 Major depressive disorder, single episode, unspecified: Secondary | ICD-10-CM | POA: Insufficient documentation

## 2014-06-04 DIAGNOSIS — E039 Hypothyroidism, unspecified: Secondary | ICD-10-CM | POA: Insufficient documentation

## 2014-06-04 DIAGNOSIS — F32A Depression, unspecified: Secondary | ICD-10-CM | POA: Insufficient documentation

## 2014-06-06 LAB — URINALYSIS, COMPLETE
BLOOD: NEGATIVE
Bacteria: NONE SEEN
Bilirubin,UR: NEGATIVE
GLUCOSE, UR: NEGATIVE mg/dL (ref 0–75)
KETONE: NEGATIVE
LEUKOCYTE ESTERASE: NEGATIVE
NITRITE: NEGATIVE
Ph: 6 (ref 4.5–8.0)
Protein: NEGATIVE
RBC,UR: 1 /HPF (ref 0–5)
Specific Gravity: 1.015 (ref 1.003–1.030)
Squamous Epithelial: 1

## 2014-06-06 LAB — PLATELET COUNT: Platelet: 51 10*3/uL — ABNORMAL LOW (ref 150–440)

## 2014-06-07 LAB — URINE CULTURE

## 2014-06-08 LAB — URINE CULTURE

## 2014-06-12 ENCOUNTER — Ambulatory Visit: Payer: Self-pay | Admitting: Internal Medicine

## 2014-06-13 LAB — BASIC METABOLIC PANEL
ANION GAP: 7 (ref 7–16)
BUN: 15 mg/dL (ref 7–18)
CALCIUM: 8.5 mg/dL (ref 8.5–10.1)
CHLORIDE: 105 mmol/L (ref 98–107)
CO2: 31 mmol/L (ref 21–32)
Creatinine: 1.05 mg/dL (ref 0.60–1.30)
EGFR (African American): 59 — ABNORMAL LOW
EGFR (Non-African Amer.): 51 — ABNORMAL LOW
GLUCOSE: 70 mg/dL (ref 65–99)
Osmolality: 284 (ref 275–301)
POTASSIUM: 4.2 mmol/L (ref 3.5–5.1)
SODIUM: 143 mmol/L (ref 136–145)

## 2014-06-13 LAB — CBC CANCER CENTER
BASOS PCT: 1.4 %
Basophil #: 0.1 x10 3/mm (ref 0.0–0.1)
EOS ABS: 0.2 x10 3/mm (ref 0.0–0.7)
EOS PCT: 2.2 %
HCT: 38.6 % (ref 35.0–47.0)
HGB: 12.5 g/dL (ref 12.0–16.0)
LYMPHS ABS: 3.1 x10 3/mm (ref 1.0–3.6)
Lymphocyte %: 32.9 %
MCH: 30.8 pg (ref 26.0–34.0)
MCHC: 32.5 g/dL (ref 32.0–36.0)
MCV: 95 fL (ref 80–100)
MONO ABS: 0.8 x10 3/mm (ref 0.2–0.9)
MONOS PCT: 8.6 %
NEUTROS PCT: 54.9 %
Neutrophil #: 5.1 x10 3/mm (ref 1.4–6.5)
PLATELETS: 51 x10 3/mm — AB (ref 150–440)
RBC: 4.06 10*6/uL (ref 3.80–5.20)
RDW: 15 % — ABNORMAL HIGH (ref 11.5–14.5)
WBC: 9.3 x10 3/mm (ref 3.6–11.0)

## 2014-06-13 LAB — TSH: Thyroid Stimulating Horm: 1.79 u[IU]/mL

## 2014-06-13 LAB — CREATININE, SERUM: CREATINE, SERUM: 1.05

## 2014-06-20 LAB — CANCER CTR PLATELET CT: PLATELETS: 55 x10 3/mm — AB (ref 150–440)

## 2014-06-28 DIAGNOSIS — I10 Essential (primary) hypertension: Secondary | ICD-10-CM | POA: Insufficient documentation

## 2014-06-28 DIAGNOSIS — E785 Hyperlipidemia, unspecified: Secondary | ICD-10-CM | POA: Insufficient documentation

## 2014-06-28 DIAGNOSIS — D473 Essential (hemorrhagic) thrombocythemia: Secondary | ICD-10-CM | POA: Insufficient documentation

## 2014-07-04 LAB — CANCER CTR PLATELET CT: Platelet: 44 x10 3/mm — ABNORMAL LOW (ref 150–440)

## 2014-07-06 LAB — CBC CANCER CENTER
BASOS PCT: 0.4 %
Basophil #: 0.1 x10 3/mm (ref 0.0–0.1)
EOS ABS: 0 x10 3/mm (ref 0.0–0.7)
Eosinophil %: 0.3 %
HCT: 42 % (ref 35.0–47.0)
HGB: 13.4 g/dL (ref 12.0–16.0)
Lymphocyte #: 1.1 x10 3/mm (ref 1.0–3.6)
Lymphocyte %: 8.4 %
MCH: 30.4 pg (ref 26.0–34.0)
MCHC: 32 g/dL (ref 32.0–36.0)
MCV: 95 fL (ref 80–100)
MONO ABS: 1 x10 3/mm — AB (ref 0.2–0.9)
MONOS PCT: 7.5 %
NEUTROS ABS: 10.6 x10 3/mm — AB (ref 1.4–6.5)
Neutrophil %: 83.4 %
Platelet: 50 x10 3/mm — ABNORMAL LOW (ref 150–440)
RBC: 4.4 10*6/uL (ref 3.80–5.20)
RDW: 15.2 % — AB (ref 11.5–14.5)
WBC: 12.7 x10 3/mm — AB (ref 3.6–11.0)

## 2014-07-11 LAB — PLATELET COUNT: Platelet: 40 10*3/uL — ABNORMAL LOW (ref 150–440)

## 2014-07-12 ENCOUNTER — Ambulatory Visit: Payer: Self-pay | Admitting: Internal Medicine

## 2014-07-17 LAB — PLATELET COUNT: Platelet: 73 10*3/uL — ABNORMAL LOW (ref 150–440)

## 2014-07-30 LAB — PLATELET COUNT: Platelet: 36 10*3/uL — ABNORMAL LOW (ref 150–440)

## 2014-08-01 LAB — CBC CANCER CENTER
BASOS ABS: 0.1 x10 3/mm (ref 0.0–0.1)
Basophil %: 1.4 %
EOS PCT: 3 %
Eosinophil #: 0.2 x10 3/mm (ref 0.0–0.7)
HCT: 42 % (ref 35.0–47.0)
HGB: 13.4 g/dL (ref 12.0–16.0)
LYMPHS PCT: 31.4 %
Lymphocyte #: 2.5 x10 3/mm (ref 1.0–3.6)
MCH: 30.9 pg (ref 26.0–34.0)
MCHC: 31.9 g/dL — ABNORMAL LOW (ref 32.0–36.0)
MCV: 97 fL (ref 80–100)
MONO ABS: 0.6 x10 3/mm (ref 0.2–0.9)
Monocyte %: 7.4 %
NEUTROS PCT: 56.8 %
Neutrophil #: 4.5 x10 3/mm (ref 1.4–6.5)
Platelet: 36 x10 3/mm — ABNORMAL LOW (ref 150–440)
RBC: 4.34 10*6/uL (ref 3.80–5.20)
RDW: 15.1 % — ABNORMAL HIGH (ref 11.5–14.5)
WBC: 8 x10 3/mm (ref 3.6–11.0)

## 2014-08-08 LAB — CBC CANCER CENTER
Basophil #: 0 x10 3/mm (ref 0.0–0.1)
Basophil %: 0.4 %
EOS PCT: 1.5 %
Eosinophil #: 0.2 x10 3/mm (ref 0.0–0.7)
HCT: 39.7 % (ref 35.0–47.0)
HGB: 12.7 g/dL (ref 12.0–16.0)
LYMPHS ABS: 3 x10 3/mm (ref 1.0–3.6)
Lymphocyte %: 27 %
MCH: 31 pg (ref 26.0–34.0)
MCHC: 32 g/dL (ref 32.0–36.0)
MCV: 97 fL (ref 80–100)
Monocyte #: 0.7 x10 3/mm (ref 0.2–0.9)
Monocyte %: 6.4 %
NEUTROS PCT: 64.7 %
Neutrophil #: 7.1 x10 3/mm — ABNORMAL HIGH (ref 1.4–6.5)
Platelet: 18 x10 3/mm — CL (ref 150–440)
RBC: 4.1 10*6/uL (ref 3.80–5.20)
RDW: 16 % — ABNORMAL HIGH (ref 11.5–14.5)
WBC: 10.9 x10 3/mm (ref 3.6–11.0)

## 2014-08-12 ENCOUNTER — Ambulatory Visit: Payer: Self-pay | Admitting: Internal Medicine

## 2014-08-15 LAB — CBC CANCER CENTER
BASOS ABS: 0.1 x10 3/mm (ref 0.0–0.1)
Basophil %: 1 %
EOS ABS: 0.2 x10 3/mm (ref 0.0–0.7)
Eosinophil %: 1.9 %
HCT: 41.6 % (ref 35.0–47.0)
HGB: 13.4 g/dL (ref 12.0–16.0)
Lymphocyte #: 2.6 x10 3/mm (ref 1.0–3.6)
Lymphocyte %: 25.6 %
MCH: 31.2 pg (ref 26.0–34.0)
MCHC: 32.2 g/dL (ref 32.0–36.0)
MCV: 97 fL (ref 80–100)
MONO ABS: 0.8 x10 3/mm (ref 0.2–0.9)
Monocyte %: 7.9 %
Neutrophil #: 6.5 x10 3/mm (ref 1.4–6.5)
Neutrophil %: 63.6 %
Platelet: 131 x10 3/mm — ABNORMAL LOW (ref 150–440)
RBC: 4.3 10*6/uL (ref 3.80–5.20)
RDW: 15.8 % — ABNORMAL HIGH (ref 11.5–14.5)
WBC: 10.2 x10 3/mm (ref 3.6–11.0)

## 2014-08-20 LAB — CANCER CTR PLATELET CT: Platelet: 175 x10 3/mm (ref 150–440)

## 2014-08-27 LAB — PLATELET COUNT: Platelet: 57 10*3/uL — ABNORMAL LOW (ref 150–440)

## 2014-09-03 ENCOUNTER — Ambulatory Visit: Payer: Self-pay | Admitting: Family Medicine

## 2014-09-03 LAB — CBC CANCER CENTER
BASOS PCT: 1.3 %
Basophil #: 0.1 x10 3/mm (ref 0.0–0.1)
EOS ABS: 0.2 x10 3/mm (ref 0.0–0.7)
EOS PCT: 2.9 %
HCT: 42 % (ref 35.0–47.0)
HGB: 13.4 g/dL (ref 12.0–16.0)
LYMPHS ABS: 2.3 x10 3/mm (ref 1.0–3.6)
Lymphocyte %: 27.5 %
MCH: 30.6 pg (ref 26.0–34.0)
MCHC: 31.8 g/dL — AB (ref 32.0–36.0)
MCV: 96 fL (ref 80–100)
MONO ABS: 0.7 x10 3/mm (ref 0.2–0.9)
MONOS PCT: 8.8 %
NEUTROS ABS: 4.9 x10 3/mm (ref 1.4–6.5)
Neutrophil %: 59.5 %
Platelet: 88 x10 3/mm — ABNORMAL LOW (ref 150–440)
RBC: 4.37 10*6/uL (ref 3.80–5.20)
RDW: 14.9 % — AB (ref 11.5–14.5)
WBC: 8.3 x10 3/mm (ref 3.6–11.0)

## 2014-09-03 LAB — URINALYSIS, COMPLETE
Bacteria: NONE SEEN
Bilirubin,UR: NEGATIVE
Blood: NEGATIVE
Glucose,UR: NEGATIVE mg/dL (ref 0–75)
Ketone: NEGATIVE
Leukocyte Esterase: NEGATIVE
Nitrite: NEGATIVE
Ph: 5 (ref 4.5–8.0)
Protein: NEGATIVE
RBC,UR: NONE SEEN /HPF (ref 0–5)
Specific Gravity: 1.018 (ref 1.003–1.030)
Squamous Epithelial: <1
WBC UR: NONE SEEN /HPF (ref 0–5)

## 2014-09-05 LAB — URINE CULTURE

## 2014-09-10 LAB — PLATELET COUNT: Platelet: 170 10*3/uL (ref 150–440)

## 2014-09-11 ENCOUNTER — Ambulatory Visit: Payer: Self-pay | Admitting: Internal Medicine

## 2014-09-19 LAB — CBC CANCER CENTER
Basophil #: 0.1 x10 3/mm (ref 0.0–0.1)
Basophil %: 1.2 %
EOS ABS: 0.1 x10 3/mm (ref 0.0–0.7)
EOS PCT: 1.6 %
HCT: 42.1 % (ref 35.0–47.0)
HGB: 13.8 g/dL (ref 12.0–16.0)
LYMPHS PCT: 31.6 %
Lymphocyte #: 2.4 x10 3/mm (ref 1.0–3.6)
MCH: 30.6 pg (ref 26.0–34.0)
MCHC: 32.7 g/dL (ref 32.0–36.0)
MCV: 94 fL (ref 80–100)
Monocyte #: 0.8 x10 3/mm (ref 0.2–0.9)
Monocyte %: 10.3 %
Neutrophil #: 4.1 x10 3/mm (ref 1.4–6.5)
Neutrophil %: 55.3 %
Platelet: 97 x10 3/mm — ABNORMAL LOW (ref 150–440)
RBC: 4.5 10*6/uL (ref 3.80–5.20)
RDW: 14.3 % (ref 11.5–14.5)
WBC: 7.4 x10 3/mm (ref 3.6–11.0)

## 2014-09-24 LAB — CANCER CTR PLATELET CT: Platelet: 39 x10 3/mm — ABNORMAL LOW (ref 150–440)

## 2014-10-01 LAB — PLATELET COUNT: Platelet: 85 10*3/uL — ABNORMAL LOW (ref 150–440)

## 2014-10-08 LAB — PLATELET COUNT: Platelet: 206 10*3/uL (ref 150–440)

## 2014-10-12 ENCOUNTER — Ambulatory Visit: Payer: Self-pay | Admitting: Internal Medicine

## 2014-10-15 DIAGNOSIS — Z86711 Personal history of pulmonary embolism: Secondary | ICD-10-CM | POA: Diagnosis not present

## 2014-10-15 DIAGNOSIS — F418 Other specified anxiety disorders: Secondary | ICD-10-CM | POA: Diagnosis not present

## 2014-10-15 DIAGNOSIS — D693 Immune thrombocytopenic purpura: Secondary | ICD-10-CM | POA: Diagnosis not present

## 2014-10-15 DIAGNOSIS — R5383 Other fatigue: Secondary | ICD-10-CM | POA: Diagnosis not present

## 2014-10-15 DIAGNOSIS — R51 Headache: Secondary | ICD-10-CM | POA: Diagnosis not present

## 2014-10-15 DIAGNOSIS — M810 Age-related osteoporosis without current pathological fracture: Secondary | ICD-10-CM | POA: Diagnosis not present

## 2014-10-15 DIAGNOSIS — K219 Gastro-esophageal reflux disease without esophagitis: Secondary | ICD-10-CM | POA: Diagnosis not present

## 2014-10-15 DIAGNOSIS — Z9081 Acquired absence of spleen: Secondary | ICD-10-CM | POA: Diagnosis not present

## 2014-10-15 DIAGNOSIS — Z79899 Other long term (current) drug therapy: Secondary | ICD-10-CM | POA: Diagnosis not present

## 2014-10-15 LAB — CBC CANCER CENTER
BASOS ABS: 0.1 x10 3/mm (ref 0.0–0.1)
BASOS PCT: 1 %
EOS ABS: 0.1 x10 3/mm (ref 0.0–0.7)
Eosinophil %: 1.7 %
HCT: 43.7 % (ref 35.0–47.0)
HGB: 14.1 g/dL (ref 12.0–16.0)
Lymphocyte #: 2.2 x10 3/mm (ref 1.0–3.6)
Lymphocyte %: 34 %
MCH: 30.2 pg (ref 26.0–34.0)
MCHC: 32.2 g/dL (ref 32.0–36.0)
MCV: 94 fL (ref 80–100)
Monocyte #: 0.5 x10 3/mm (ref 0.2–0.9)
Monocyte %: 7.6 %
NEUTROS PCT: 55.7 %
Neutrophil #: 3.5 x10 3/mm (ref 1.4–6.5)
Platelet: 76 x10 3/mm — ABNORMAL LOW (ref 150–440)
RBC: 4.66 10*6/uL (ref 3.80–5.20)
RDW: 13.9 % (ref 11.5–14.5)
WBC: 6.4 x10 3/mm (ref 3.6–11.0)

## 2014-10-18 DIAGNOSIS — I10 Essential (primary) hypertension: Secondary | ICD-10-CM | POA: Diagnosis not present

## 2014-10-18 DIAGNOSIS — Z79899 Other long term (current) drug therapy: Secondary | ICD-10-CM | POA: Diagnosis not present

## 2014-10-18 DIAGNOSIS — R0602 Shortness of breath: Secondary | ICD-10-CM | POA: Diagnosis not present

## 2014-10-18 DIAGNOSIS — F418 Other specified anxiety disorders: Secondary | ICD-10-CM | POA: Diagnosis not present

## 2014-10-18 DIAGNOSIS — M810 Age-related osteoporosis without current pathological fracture: Secondary | ICD-10-CM | POA: Diagnosis not present

## 2014-10-18 DIAGNOSIS — R51 Headache: Secondary | ICD-10-CM | POA: Diagnosis not present

## 2014-10-18 DIAGNOSIS — Z9081 Acquired absence of spleen: Secondary | ICD-10-CM | POA: Diagnosis not present

## 2014-10-18 DIAGNOSIS — E039 Hypothyroidism, unspecified: Secondary | ICD-10-CM | POA: Diagnosis not present

## 2014-10-18 DIAGNOSIS — D693 Immune thrombocytopenic purpura: Secondary | ICD-10-CM | POA: Diagnosis not present

## 2014-10-18 DIAGNOSIS — M5136 Other intervertebral disc degeneration, lumbar region: Secondary | ICD-10-CM | POA: Diagnosis not present

## 2014-10-18 DIAGNOSIS — M8580 Other specified disorders of bone density and structure, unspecified site: Secondary | ICD-10-CM | POA: Insufficient documentation

## 2014-10-18 DIAGNOSIS — Z86711 Personal history of pulmonary embolism: Secondary | ICD-10-CM | POA: Diagnosis not present

## 2014-10-18 DIAGNOSIS — M5416 Radiculopathy, lumbar region: Secondary | ICD-10-CM | POA: Diagnosis not present

## 2014-10-18 DIAGNOSIS — R5383 Other fatigue: Secondary | ICD-10-CM | POA: Diagnosis not present

## 2014-10-18 DIAGNOSIS — E785 Hyperlipidemia, unspecified: Secondary | ICD-10-CM | POA: Diagnosis not present

## 2014-10-18 DIAGNOSIS — K219 Gastro-esophageal reflux disease without esophagitis: Secondary | ICD-10-CM | POA: Diagnosis not present

## 2014-10-18 LAB — PLATELET COUNT: Platelet: 47 10*3/uL — ABNORMAL LOW (ref 150–440)

## 2014-10-22 DIAGNOSIS — I10 Essential (primary) hypertension: Secondary | ICD-10-CM | POA: Diagnosis not present

## 2014-10-22 DIAGNOSIS — R079 Chest pain, unspecified: Secondary | ICD-10-CM | POA: Diagnosis not present

## 2014-10-22 DIAGNOSIS — R0681 Apnea, not elsewhere classified: Secondary | ICD-10-CM | POA: Insufficient documentation

## 2014-10-22 DIAGNOSIS — R0602 Shortness of breath: Secondary | ICD-10-CM | POA: Diagnosis not present

## 2014-10-22 DIAGNOSIS — E785 Hyperlipidemia, unspecified: Secondary | ICD-10-CM | POA: Diagnosis not present

## 2014-10-25 DIAGNOSIS — R51 Headache: Secondary | ICD-10-CM | POA: Diagnosis not present

## 2014-10-25 DIAGNOSIS — M8580 Other specified disorders of bone density and structure, unspecified site: Secondary | ICD-10-CM | POA: Diagnosis not present

## 2014-10-25 DIAGNOSIS — R5383 Other fatigue: Secondary | ICD-10-CM | POA: Diagnosis not present

## 2014-10-25 DIAGNOSIS — D693 Immune thrombocytopenic purpura: Secondary | ICD-10-CM | POA: Diagnosis not present

## 2014-10-25 DIAGNOSIS — M810 Age-related osteoporosis without current pathological fracture: Secondary | ICD-10-CM | POA: Diagnosis not present

## 2014-10-25 DIAGNOSIS — K219 Gastro-esophageal reflux disease without esophagitis: Secondary | ICD-10-CM | POA: Diagnosis not present

## 2014-10-25 DIAGNOSIS — Z79899 Other long term (current) drug therapy: Secondary | ICD-10-CM | POA: Diagnosis not present

## 2014-10-25 DIAGNOSIS — F418 Other specified anxiety disorders: Secondary | ICD-10-CM | POA: Diagnosis not present

## 2014-10-25 DIAGNOSIS — Z86711 Personal history of pulmonary embolism: Secondary | ICD-10-CM | POA: Diagnosis not present

## 2014-10-25 DIAGNOSIS — Z9081 Acquired absence of spleen: Secondary | ICD-10-CM | POA: Diagnosis not present

## 2014-10-25 LAB — CANCER CTR PLATELET CT: Platelet: 43 x10 3/mm — ABNORMAL LOW (ref 150–440)

## 2014-10-29 DIAGNOSIS — R079 Chest pain, unspecified: Secondary | ICD-10-CM | POA: Diagnosis not present

## 2014-10-29 DIAGNOSIS — R001 Bradycardia, unspecified: Secondary | ICD-10-CM | POA: Diagnosis not present

## 2014-11-01 DIAGNOSIS — Z79899 Other long term (current) drug therapy: Secondary | ICD-10-CM | POA: Diagnosis not present

## 2014-11-01 DIAGNOSIS — R51 Headache: Secondary | ICD-10-CM | POA: Diagnosis not present

## 2014-11-01 DIAGNOSIS — Z86711 Personal history of pulmonary embolism: Secondary | ICD-10-CM | POA: Diagnosis not present

## 2014-11-01 DIAGNOSIS — D693 Immune thrombocytopenic purpura: Secondary | ICD-10-CM | POA: Diagnosis not present

## 2014-11-01 DIAGNOSIS — Z9081 Acquired absence of spleen: Secondary | ICD-10-CM | POA: Diagnosis not present

## 2014-11-01 DIAGNOSIS — F418 Other specified anxiety disorders: Secondary | ICD-10-CM | POA: Diagnosis not present

## 2014-11-01 DIAGNOSIS — M810 Age-related osteoporosis without current pathological fracture: Secondary | ICD-10-CM | POA: Diagnosis not present

## 2014-11-01 DIAGNOSIS — K219 Gastro-esophageal reflux disease without esophagitis: Secondary | ICD-10-CM | POA: Diagnosis not present

## 2014-11-01 DIAGNOSIS — R5383 Other fatigue: Secondary | ICD-10-CM | POA: Diagnosis not present

## 2014-11-01 LAB — CBC CANCER CENTER
BASOS PCT: 1.1 %
Basophil #: 0.1 x10 3/mm (ref 0.0–0.1)
Eosinophil #: 0.1 x10 3/mm (ref 0.0–0.7)
Eosinophil %: 1.4 %
HCT: 42 % (ref 35.0–47.0)
HGB: 13.7 g/dL (ref 12.0–16.0)
Lymphocyte #: 2.7 x10 3/mm (ref 1.0–3.6)
Lymphocyte %: 26.1 %
MCH: 30 pg (ref 26.0–34.0)
MCHC: 32.6 g/dL (ref 32.0–36.0)
MCV: 92 fL (ref 80–100)
MONOS PCT: 5.5 %
Monocyte #: 0.6 x10 3/mm (ref 0.2–0.9)
NEUTROS ABS: 6.8 x10 3/mm — AB (ref 1.4–6.5)
Neutrophil %: 65.9 %
Platelet: 244 x10 3/mm (ref 150–440)
RBC: 4.57 10*6/uL (ref 3.80–5.20)
RDW: 14.4 % (ref 11.5–14.5)
WBC: 10.3 x10 3/mm (ref 3.6–11.0)

## 2014-11-05 DIAGNOSIS — E780 Pure hypercholesterolemia: Secondary | ICD-10-CM | POA: Diagnosis not present

## 2014-11-05 DIAGNOSIS — R0602 Shortness of breath: Secondary | ICD-10-CM | POA: Diagnosis not present

## 2014-11-05 DIAGNOSIS — R079 Chest pain, unspecified: Secondary | ICD-10-CM | POA: Diagnosis not present

## 2014-11-05 DIAGNOSIS — I10 Essential (primary) hypertension: Secondary | ICD-10-CM | POA: Diagnosis not present

## 2014-11-07 DIAGNOSIS — M810 Age-related osteoporosis without current pathological fracture: Secondary | ICD-10-CM | POA: Diagnosis not present

## 2014-11-07 DIAGNOSIS — F418 Other specified anxiety disorders: Secondary | ICD-10-CM | POA: Diagnosis not present

## 2014-11-07 DIAGNOSIS — D693 Immune thrombocytopenic purpura: Secondary | ICD-10-CM | POA: Diagnosis not present

## 2014-11-07 DIAGNOSIS — R51 Headache: Secondary | ICD-10-CM | POA: Diagnosis not present

## 2014-11-07 DIAGNOSIS — R5383 Other fatigue: Secondary | ICD-10-CM | POA: Diagnosis not present

## 2014-11-07 DIAGNOSIS — K219 Gastro-esophageal reflux disease without esophagitis: Secondary | ICD-10-CM | POA: Diagnosis not present

## 2014-11-07 DIAGNOSIS — Z79899 Other long term (current) drug therapy: Secondary | ICD-10-CM | POA: Diagnosis not present

## 2014-11-07 DIAGNOSIS — Z86711 Personal history of pulmonary embolism: Secondary | ICD-10-CM | POA: Diagnosis not present

## 2014-11-07 DIAGNOSIS — Z9081 Acquired absence of spleen: Secondary | ICD-10-CM | POA: Diagnosis not present

## 2014-11-07 LAB — CBC CANCER CENTER
Basophil #: 0.1 x10 3/mm (ref 0.0–0.1)
Basophil %: 1.3 %
EOS PCT: 2.5 %
Eosinophil #: 0.2 x10 3/mm (ref 0.0–0.7)
HCT: 40.3 % (ref 35.0–47.0)
HGB: 12.9 g/dL (ref 12.0–16.0)
Lymphocyte #: 2.3 x10 3/mm (ref 1.0–3.6)
Lymphocyte %: 29.9 %
MCH: 29.8 pg (ref 26.0–34.0)
MCHC: 32.2 g/dL (ref 32.0–36.0)
MCV: 93 fL (ref 80–100)
Monocyte #: 0.7 x10 3/mm (ref 0.2–0.9)
Monocyte %: 9.4 %
NEUTROS PCT: 56.9 %
Neutrophil #: 4.4 x10 3/mm (ref 1.4–6.5)
Platelet: 376 x10 3/mm (ref 150–440)
RBC: 4.34 10*6/uL (ref 3.80–5.20)
RDW: 14 % (ref 11.5–14.5)
WBC: 7.7 x10 3/mm (ref 3.6–11.0)

## 2014-11-12 ENCOUNTER — Ambulatory Visit: Payer: Self-pay | Admitting: Internal Medicine

## 2014-11-16 DIAGNOSIS — Z79899 Other long term (current) drug therapy: Secondary | ICD-10-CM | POA: Diagnosis not present

## 2014-11-16 DIAGNOSIS — D693 Immune thrombocytopenic purpura: Secondary | ICD-10-CM | POA: Diagnosis not present

## 2014-11-16 LAB — CANCER CTR PLATELET CT: Platelet: 81 x10 3/mm — ABNORMAL LOW (ref 150–440)

## 2014-11-19 DIAGNOSIS — M7061 Trochanteric bursitis, right hip: Secondary | ICD-10-CM | POA: Diagnosis not present

## 2014-11-19 DIAGNOSIS — M5136 Other intervertebral disc degeneration, lumbar region: Secondary | ICD-10-CM | POA: Diagnosis not present

## 2014-11-19 DIAGNOSIS — M62830 Muscle spasm of back: Secondary | ICD-10-CM | POA: Diagnosis not present

## 2014-11-19 DIAGNOSIS — M5416 Radiculopathy, lumbar region: Secondary | ICD-10-CM | POA: Diagnosis not present

## 2014-11-23 DIAGNOSIS — Z79899 Other long term (current) drug therapy: Secondary | ICD-10-CM | POA: Diagnosis not present

## 2014-11-23 DIAGNOSIS — D693 Immune thrombocytopenic purpura: Secondary | ICD-10-CM | POA: Diagnosis not present

## 2014-11-29 DIAGNOSIS — E780 Pure hypercholesterolemia: Secondary | ICD-10-CM | POA: Diagnosis not present

## 2014-11-29 DIAGNOSIS — F3341 Major depressive disorder, recurrent, in partial remission: Secondary | ICD-10-CM | POA: Diagnosis not present

## 2014-11-29 DIAGNOSIS — Z79899 Other long term (current) drug therapy: Secondary | ICD-10-CM | POA: Diagnosis not present

## 2014-11-29 DIAGNOSIS — D693 Immune thrombocytopenic purpura: Secondary | ICD-10-CM | POA: Diagnosis not present

## 2014-11-29 DIAGNOSIS — I10 Essential (primary) hypertension: Secondary | ICD-10-CM | POA: Diagnosis not present

## 2014-11-29 DIAGNOSIS — E039 Hypothyroidism, unspecified: Secondary | ICD-10-CM | POA: Diagnosis not present

## 2014-12-07 DIAGNOSIS — Z79899 Other long term (current) drug therapy: Secondary | ICD-10-CM | POA: Diagnosis not present

## 2014-12-07 DIAGNOSIS — D693 Immune thrombocytopenic purpura: Secondary | ICD-10-CM | POA: Diagnosis not present

## 2014-12-11 ENCOUNTER — Ambulatory Visit
Admit: 2014-12-11 | Disposition: A | Discharge status: Home / Self Care | Payer: Self-pay | Attending: Internal Medicine | Admitting: Internal Medicine

## 2014-12-13 DIAGNOSIS — E780 Pure hypercholesterolemia: Secondary | ICD-10-CM | POA: Diagnosis not present

## 2014-12-13 DIAGNOSIS — L988 Other specified disorders of the skin and subcutaneous tissue: Secondary | ICD-10-CM | POA: Diagnosis not present

## 2014-12-13 DIAGNOSIS — G2581 Restless legs syndrome: Secondary | ICD-10-CM | POA: Diagnosis not present

## 2014-12-13 DIAGNOSIS — M1990 Unspecified osteoarthritis, unspecified site: Secondary | ICD-10-CM | POA: Diagnosis not present

## 2014-12-13 DIAGNOSIS — D693 Immune thrombocytopenic purpura: Secondary | ICD-10-CM | POA: Diagnosis not present

## 2014-12-13 DIAGNOSIS — K219 Gastro-esophageal reflux disease without esophagitis: Secondary | ICD-10-CM | POA: Diagnosis not present

## 2014-12-13 DIAGNOSIS — Z79899 Other long term (current) drug therapy: Secondary | ICD-10-CM | POA: Diagnosis not present

## 2014-12-13 DIAGNOSIS — M5136 Other intervertebral disc degeneration, lumbar region: Secondary | ICD-10-CM | POA: Diagnosis not present

## 2014-12-13 DIAGNOSIS — R5383 Other fatigue: Secondary | ICD-10-CM | POA: Diagnosis not present

## 2014-12-20 DIAGNOSIS — E780 Pure hypercholesterolemia: Secondary | ICD-10-CM | POA: Diagnosis not present

## 2014-12-20 DIAGNOSIS — M5136 Other intervertebral disc degeneration, lumbar region: Secondary | ICD-10-CM | POA: Diagnosis not present

## 2014-12-20 DIAGNOSIS — R5383 Other fatigue: Secondary | ICD-10-CM | POA: Diagnosis not present

## 2014-12-20 DIAGNOSIS — L988 Other specified disorders of the skin and subcutaneous tissue: Secondary | ICD-10-CM | POA: Diagnosis not present

## 2014-12-20 DIAGNOSIS — K219 Gastro-esophageal reflux disease without esophagitis: Secondary | ICD-10-CM | POA: Diagnosis not present

## 2014-12-20 DIAGNOSIS — D693 Immune thrombocytopenic purpura: Secondary | ICD-10-CM | POA: Diagnosis not present

## 2014-12-20 DIAGNOSIS — M1990 Unspecified osteoarthritis, unspecified site: Secondary | ICD-10-CM | POA: Diagnosis not present

## 2014-12-20 DIAGNOSIS — G2581 Restless legs syndrome: Secondary | ICD-10-CM | POA: Diagnosis not present

## 2014-12-20 DIAGNOSIS — H40003 Preglaucoma, unspecified, bilateral: Secondary | ICD-10-CM | POA: Diagnosis not present

## 2014-12-20 DIAGNOSIS — Z79899 Other long term (current) drug therapy: Secondary | ICD-10-CM | POA: Diagnosis not present

## 2014-12-23 ENCOUNTER — Emergency Department: Payer: Self-pay | Admitting: Emergency Medicine

## 2014-12-23 DIAGNOSIS — R05 Cough: Secondary | ICD-10-CM | POA: Diagnosis not present

## 2014-12-23 DIAGNOSIS — J209 Acute bronchitis, unspecified: Secondary | ICD-10-CM | POA: Diagnosis not present

## 2014-12-23 DIAGNOSIS — R9431 Abnormal electrocardiogram [ECG] [EKG]: Secondary | ICD-10-CM | POA: Diagnosis not present

## 2014-12-28 DIAGNOSIS — J45901 Unspecified asthma with (acute) exacerbation: Secondary | ICD-10-CM | POA: Diagnosis not present

## 2014-12-28 DIAGNOSIS — J309 Allergic rhinitis, unspecified: Secondary | ICD-10-CM | POA: Diagnosis not present

## 2014-12-31 DIAGNOSIS — E780 Pure hypercholesterolemia: Secondary | ICD-10-CM | POA: Diagnosis not present

## 2014-12-31 DIAGNOSIS — F411 Generalized anxiety disorder: Secondary | ICD-10-CM | POA: Diagnosis not present

## 2014-12-31 DIAGNOSIS — J4531 Mild persistent asthma with (acute) exacerbation: Secondary | ICD-10-CM | POA: Diagnosis not present

## 2014-12-31 DIAGNOSIS — M1990 Unspecified osteoarthritis, unspecified site: Secondary | ICD-10-CM | POA: Diagnosis not present

## 2014-12-31 DIAGNOSIS — D693 Immune thrombocytopenic purpura: Secondary | ICD-10-CM | POA: Diagnosis not present

## 2014-12-31 DIAGNOSIS — G2581 Restless legs syndrome: Secondary | ICD-10-CM | POA: Diagnosis not present

## 2014-12-31 DIAGNOSIS — R5383 Other fatigue: Secondary | ICD-10-CM | POA: Diagnosis not present

## 2014-12-31 DIAGNOSIS — F5104 Psychophysiologic insomnia: Secondary | ICD-10-CM | POA: Diagnosis not present

## 2014-12-31 DIAGNOSIS — L988 Other specified disorders of the skin and subcutaneous tissue: Secondary | ICD-10-CM | POA: Diagnosis not present

## 2014-12-31 DIAGNOSIS — M5136 Other intervertebral disc degeneration, lumbar region: Secondary | ICD-10-CM | POA: Diagnosis not present

## 2014-12-31 DIAGNOSIS — Z79899 Other long term (current) drug therapy: Secondary | ICD-10-CM | POA: Diagnosis not present

## 2014-12-31 DIAGNOSIS — K219 Gastro-esophageal reflux disease without esophagitis: Secondary | ICD-10-CM | POA: Diagnosis not present

## 2015-01-02 DIAGNOSIS — J4550 Severe persistent asthma, uncomplicated: Secondary | ICD-10-CM | POA: Diagnosis not present

## 2015-01-02 DIAGNOSIS — J309 Allergic rhinitis, unspecified: Secondary | ICD-10-CM | POA: Diagnosis not present

## 2015-01-03 DIAGNOSIS — D485 Neoplasm of uncertain behavior of skin: Secondary | ICD-10-CM | POA: Diagnosis not present

## 2015-01-03 DIAGNOSIS — D0439 Carcinoma in situ of skin of other parts of face: Secondary | ICD-10-CM | POA: Diagnosis not present

## 2015-01-03 DIAGNOSIS — H3531 Nonexudative age-related macular degeneration: Secondary | ICD-10-CM | POA: Diagnosis not present

## 2015-01-07 DIAGNOSIS — E780 Pure hypercholesterolemia: Secondary | ICD-10-CM | POA: Diagnosis not present

## 2015-01-07 DIAGNOSIS — L988 Other specified disorders of the skin and subcutaneous tissue: Secondary | ICD-10-CM | POA: Diagnosis not present

## 2015-01-07 DIAGNOSIS — K219 Gastro-esophageal reflux disease without esophagitis: Secondary | ICD-10-CM | POA: Diagnosis not present

## 2015-01-07 DIAGNOSIS — Z79899 Other long term (current) drug therapy: Secondary | ICD-10-CM | POA: Diagnosis not present

## 2015-01-07 DIAGNOSIS — G2581 Restless legs syndrome: Secondary | ICD-10-CM | POA: Diagnosis not present

## 2015-01-07 DIAGNOSIS — M1990 Unspecified osteoarthritis, unspecified site: Secondary | ICD-10-CM | POA: Diagnosis not present

## 2015-01-07 DIAGNOSIS — M5136 Other intervertebral disc degeneration, lumbar region: Secondary | ICD-10-CM | POA: Diagnosis not present

## 2015-01-07 DIAGNOSIS — R5383 Other fatigue: Secondary | ICD-10-CM | POA: Diagnosis not present

## 2015-01-07 DIAGNOSIS — D693 Immune thrombocytopenic purpura: Secondary | ICD-10-CM | POA: Diagnosis not present

## 2015-01-07 LAB — CBC CANCER CENTER
Basophil #: 0.1 x10 3/mm (ref 0.0–0.1)
Basophil %: 0.6 %
EOS ABS: 0.1 x10 3/mm (ref 0.0–0.7)
Eosinophil %: 0.7 %
HCT: 42.3 % (ref 35.0–47.0)
HGB: 13.7 g/dL (ref 12.0–16.0)
LYMPHS ABS: 2.8 x10 3/mm (ref 1.0–3.6)
Lymphocyte %: 20.4 %
MCH: 29.5 pg (ref 26.0–34.0)
MCHC: 32.5 g/dL (ref 32.0–36.0)
MCV: 91 fL (ref 80–100)
MONO ABS: 0.7 x10 3/mm (ref 0.2–0.9)
Monocyte %: 5.2 %
Neutrophil #: 9.9 x10 3/mm — ABNORMAL HIGH (ref 1.4–6.5)
Neutrophil %: 73.1 %
Platelet: 56 x10 3/mm — ABNORMAL LOW (ref 150–440)
RBC: 4.65 10*6/uL (ref 3.80–5.20)
RDW: 14.9 % — ABNORMAL HIGH (ref 11.5–14.5)
WBC: 13.6 x10 3/mm — ABNORMAL HIGH (ref 3.6–11.0)

## 2015-01-11 ENCOUNTER — Ambulatory Visit
Admit: 2015-01-11 | Disposition: A | Discharge status: Home / Self Care | Payer: Self-pay | Attending: Internal Medicine | Admitting: Internal Medicine

## 2015-01-11 DIAGNOSIS — E780 Pure hypercholesterolemia: Secondary | ICD-10-CM | POA: Diagnosis not present

## 2015-01-11 DIAGNOSIS — M5136 Other intervertebral disc degeneration, lumbar region: Secondary | ICD-10-CM | POA: Diagnosis not present

## 2015-01-11 DIAGNOSIS — D693 Immune thrombocytopenic purpura: Secondary | ICD-10-CM | POA: Diagnosis not present

## 2015-01-11 DIAGNOSIS — L988 Other specified disorders of the skin and subcutaneous tissue: Secondary | ICD-10-CM | POA: Diagnosis not present

## 2015-01-11 DIAGNOSIS — G2581 Restless legs syndrome: Secondary | ICD-10-CM | POA: Diagnosis not present

## 2015-01-11 DIAGNOSIS — Z79899 Other long term (current) drug therapy: Secondary | ICD-10-CM | POA: Diagnosis not present

## 2015-01-11 DIAGNOSIS — R5383 Other fatigue: Secondary | ICD-10-CM | POA: Diagnosis not present

## 2015-01-11 DIAGNOSIS — K219 Gastro-esophageal reflux disease without esophagitis: Secondary | ICD-10-CM | POA: Diagnosis not present

## 2015-01-11 DIAGNOSIS — M1990 Unspecified osteoarthritis, unspecified site: Secondary | ICD-10-CM | POA: Diagnosis not present

## 2015-01-11 LAB — PLATELET COUNT: Platelet: 23 10*3/uL — CL (ref 150–440)

## 2015-01-18 DIAGNOSIS — D693 Immune thrombocytopenic purpura: Secondary | ICD-10-CM | POA: Diagnosis not present

## 2015-01-18 DIAGNOSIS — Z79899 Other long term (current) drug therapy: Secondary | ICD-10-CM | POA: Diagnosis not present

## 2015-01-18 DIAGNOSIS — K219 Gastro-esophageal reflux disease without esophagitis: Secondary | ICD-10-CM | POA: Diagnosis not present

## 2015-01-18 DIAGNOSIS — M5136 Other intervertebral disc degeneration, lumbar region: Secondary | ICD-10-CM | POA: Diagnosis not present

## 2015-01-18 DIAGNOSIS — L988 Other specified disorders of the skin and subcutaneous tissue: Secondary | ICD-10-CM | POA: Diagnosis not present

## 2015-01-18 DIAGNOSIS — G2581 Restless legs syndrome: Secondary | ICD-10-CM | POA: Diagnosis not present

## 2015-01-18 DIAGNOSIS — M1990 Unspecified osteoarthritis, unspecified site: Secondary | ICD-10-CM | POA: Diagnosis not present

## 2015-01-18 DIAGNOSIS — E780 Pure hypercholesterolemia: Secondary | ICD-10-CM | POA: Diagnosis not present

## 2015-01-18 DIAGNOSIS — R5383 Other fatigue: Secondary | ICD-10-CM | POA: Diagnosis not present

## 2015-01-18 LAB — CBC CANCER CENTER
BASOS ABS: 0.1 x10 3/mm (ref 0.0–0.1)
BASOS PCT: 1.1 %
EOS PCT: 1.5 %
Eosinophil #: 0.1 x10 3/mm (ref 0.0–0.7)
HCT: 43 % (ref 35.0–47.0)
HGB: 14.2 g/dL (ref 12.0–16.0)
Lymphocyte #: 2.3 x10 3/mm (ref 1.0–3.6)
Lymphocyte %: 23.4 %
MCH: 29.8 pg (ref 26.0–34.0)
MCHC: 33.1 g/dL (ref 32.0–36.0)
MCV: 90 fL (ref 80–100)
MONOS PCT: 7.2 %
Monocyte #: 0.7 x10 3/mm (ref 0.2–0.9)
NEUTROS ABS: 6.5 x10 3/mm (ref 1.4–6.5)
NEUTROS PCT: 66.8 %
Platelet: 76 x10 3/mm — ABNORMAL LOW (ref 150–440)
RBC: 4.77 10*6/uL (ref 3.80–5.20)
RDW: 15.9 % — ABNORMAL HIGH (ref 11.5–14.5)
WBC: 9.8 x10 3/mm (ref 3.6–11.0)

## 2015-01-25 DIAGNOSIS — M5136 Other intervertebral disc degeneration, lumbar region: Secondary | ICD-10-CM | POA: Diagnosis not present

## 2015-01-25 DIAGNOSIS — R5383 Other fatigue: Secondary | ICD-10-CM | POA: Diagnosis not present

## 2015-01-25 DIAGNOSIS — L988 Other specified disorders of the skin and subcutaneous tissue: Secondary | ICD-10-CM | POA: Diagnosis not present

## 2015-01-25 DIAGNOSIS — M1990 Unspecified osteoarthritis, unspecified site: Secondary | ICD-10-CM | POA: Diagnosis not present

## 2015-01-25 DIAGNOSIS — G2581 Restless legs syndrome: Secondary | ICD-10-CM | POA: Diagnosis not present

## 2015-01-25 DIAGNOSIS — Z79899 Other long term (current) drug therapy: Secondary | ICD-10-CM | POA: Diagnosis not present

## 2015-01-25 DIAGNOSIS — K219 Gastro-esophageal reflux disease without esophagitis: Secondary | ICD-10-CM | POA: Diagnosis not present

## 2015-01-25 DIAGNOSIS — E780 Pure hypercholesterolemia: Secondary | ICD-10-CM | POA: Diagnosis not present

## 2015-01-25 DIAGNOSIS — D693 Immune thrombocytopenic purpura: Secondary | ICD-10-CM | POA: Diagnosis not present

## 2015-01-25 LAB — PLATELET COUNT: PLATELETS: 288 10*3/uL (ref 150–440)

## 2015-02-01 DIAGNOSIS — D0439 Carcinoma in situ of skin of other parts of face: Secondary | ICD-10-CM | POA: Diagnosis not present

## 2015-02-04 DIAGNOSIS — L988 Other specified disorders of the skin and subcutaneous tissue: Secondary | ICD-10-CM | POA: Diagnosis not present

## 2015-02-04 DIAGNOSIS — M1990 Unspecified osteoarthritis, unspecified site: Secondary | ICD-10-CM | POA: Diagnosis not present

## 2015-02-04 DIAGNOSIS — E780 Pure hypercholesterolemia: Secondary | ICD-10-CM | POA: Diagnosis not present

## 2015-02-04 DIAGNOSIS — Z79899 Other long term (current) drug therapy: Secondary | ICD-10-CM | POA: Diagnosis not present

## 2015-02-04 DIAGNOSIS — D693 Immune thrombocytopenic purpura: Secondary | ICD-10-CM | POA: Diagnosis not present

## 2015-02-04 DIAGNOSIS — M5136 Other intervertebral disc degeneration, lumbar region: Secondary | ICD-10-CM | POA: Diagnosis not present

## 2015-02-04 DIAGNOSIS — K219 Gastro-esophageal reflux disease without esophagitis: Secondary | ICD-10-CM | POA: Diagnosis not present

## 2015-02-04 DIAGNOSIS — R5383 Other fatigue: Secondary | ICD-10-CM | POA: Diagnosis not present

## 2015-02-04 DIAGNOSIS — G2581 Restless legs syndrome: Secondary | ICD-10-CM | POA: Diagnosis not present

## 2015-02-04 LAB — CANCER CTR PLATELET CT: Platelet: 97 x10 3/mm — ABNORMAL LOW (ref 150–440)

## 2015-02-07 DIAGNOSIS — I10 Essential (primary) hypertension: Secondary | ICD-10-CM | POA: Diagnosis not present

## 2015-02-07 DIAGNOSIS — E780 Pure hypercholesterolemia: Secondary | ICD-10-CM | POA: Diagnosis not present

## 2015-02-07 DIAGNOSIS — R0602 Shortness of breath: Secondary | ICD-10-CM | POA: Diagnosis not present

## 2015-02-07 DIAGNOSIS — R079 Chest pain, unspecified: Secondary | ICD-10-CM | POA: Diagnosis not present

## 2015-02-13 DIAGNOSIS — D0430 Carcinoma in situ of skin of unspecified part of face: Secondary | ICD-10-CM | POA: Diagnosis not present

## 2015-02-13 DIAGNOSIS — L578 Other skin changes due to chronic exposure to nonionizing radiation: Secondary | ICD-10-CM | POA: Diagnosis not present

## 2015-02-15 ENCOUNTER — Other Ambulatory Visit: Payer: Self-pay | Admitting: *Deleted

## 2015-02-15 DIAGNOSIS — D693 Immune thrombocytopenic purpura: Secondary | ICD-10-CM

## 2015-02-18 ENCOUNTER — Other Ambulatory Visit: Payer: Self-pay

## 2015-02-18 ENCOUNTER — Inpatient Hospital Stay: Payer: Commercial Managed Care - HMO | Attending: Internal Medicine

## 2015-02-18 DIAGNOSIS — Z79899 Other long term (current) drug therapy: Secondary | ICD-10-CM | POA: Insufficient documentation

## 2015-02-18 DIAGNOSIS — D693 Immune thrombocytopenic purpura: Secondary | ICD-10-CM

## 2015-02-18 DIAGNOSIS — M62830 Muscle spasm of back: Secondary | ICD-10-CM | POA: Diagnosis not present

## 2015-02-18 DIAGNOSIS — M7061 Trochanteric bursitis, right hip: Secondary | ICD-10-CM | POA: Diagnosis not present

## 2015-02-18 DIAGNOSIS — M5136 Other intervertebral disc degeneration, lumbar region: Secondary | ICD-10-CM | POA: Diagnosis not present

## 2015-02-18 DIAGNOSIS — M5416 Radiculopathy, lumbar region: Secondary | ICD-10-CM | POA: Diagnosis not present

## 2015-02-18 LAB — PLATELET COUNT: Platelets: 133 10*3/uL — ABNORMAL LOW (ref 150–440)

## 2015-02-26 ENCOUNTER — Other Ambulatory Visit: Payer: Self-pay | Admitting: *Deleted

## 2015-02-26 DIAGNOSIS — D693 Immune thrombocytopenic purpura: Secondary | ICD-10-CM

## 2015-02-28 ENCOUNTER — Encounter: Payer: Self-pay | Admitting: Internal Medicine

## 2015-02-28 ENCOUNTER — Inpatient Hospital Stay: Payer: Commercial Managed Care - HMO

## 2015-02-28 ENCOUNTER — Inpatient Hospital Stay: Payer: Commercial Managed Care - HMO | Admitting: Internal Medicine

## 2015-02-28 DIAGNOSIS — D693 Immune thrombocytopenic purpura: Secondary | ICD-10-CM | POA: Diagnosis not present

## 2015-02-28 DIAGNOSIS — I10 Essential (primary) hypertension: Secondary | ICD-10-CM | POA: Diagnosis not present

## 2015-02-28 DIAGNOSIS — D473 Essential (hemorrhagic) thrombocythemia: Secondary | ICD-10-CM | POA: Diagnosis not present

## 2015-02-28 DIAGNOSIS — E039 Hypothyroidism, unspecified: Secondary | ICD-10-CM | POA: Diagnosis not present

## 2015-02-28 DIAGNOSIS — M5136 Other intervertebral disc degeneration, lumbar region: Secondary | ICD-10-CM | POA: Diagnosis not present

## 2015-02-28 DIAGNOSIS — Z79899 Other long term (current) drug therapy: Secondary | ICD-10-CM | POA: Diagnosis not present

## 2015-02-28 LAB — CBC WITH DIFFERENTIAL/PLATELET
BASOS ABS: 0.1 10*3/uL (ref 0–0.1)
Eosinophils Absolute: 0.1 10*3/uL (ref 0–0.7)
HEMATOCRIT: 42.6 % (ref 35.0–47.0)
HEMOGLOBIN: 13.6 g/dL (ref 12.0–16.0)
Lymphocytes Relative: 26 %
Lymphs Abs: 2.6 10*3/uL (ref 1.0–3.6)
MCH: 29.6 pg (ref 26.0–34.0)
MCHC: 31.9 g/dL — AB (ref 32.0–36.0)
MCV: 92.7 fL (ref 80.0–100.0)
MONO ABS: 0.7 10*3/uL (ref 0.2–0.9)
Monocytes Relative: 7 %
Neutro Abs: 6.7 10*3/uL — ABNORMAL HIGH (ref 1.4–6.5)
Neutrophils Relative %: 65 %
PLATELETS: 83 10*3/uL — AB (ref 150–440)
RBC: 4.59 MIL/uL (ref 3.80–5.20)
RDW: 16.1 % — AB (ref 11.5–14.5)
WBC: 10.2 10*3/uL (ref 3.6–11.0)

## 2015-02-28 MED ORDER — ROMIPLOSTIM 250 MCG ~~LOC~~ SOLR
2.0000 ug/kg | Freq: Once | SUBCUTANEOUS | Status: AC
  Administered 2015-02-28: 130 ug via SUBCUTANEOUS
  Filled 2015-02-28: qty 0.26

## 2015-02-28 NOTE — Progress Notes (Signed)
This encounter was created in error - please disregard.

## 2015-03-14 ENCOUNTER — Other Ambulatory Visit: Payer: Self-pay | Admitting: *Deleted

## 2015-03-14 DIAGNOSIS — D693 Immune thrombocytopenic purpura: Secondary | ICD-10-CM

## 2015-03-15 ENCOUNTER — Other Ambulatory Visit: Payer: Commercial Managed Care - HMO

## 2015-03-15 ENCOUNTER — Inpatient Hospital Stay: Payer: Commercial Managed Care - HMO | Attending: Internal Medicine

## 2015-03-15 ENCOUNTER — Other Ambulatory Visit: Payer: Self-pay | Admitting: *Deleted

## 2015-03-15 DIAGNOSIS — D693 Immune thrombocytopenic purpura: Secondary | ICD-10-CM

## 2015-03-15 DIAGNOSIS — Z79899 Other long term (current) drug therapy: Secondary | ICD-10-CM | POA: Diagnosis not present

## 2015-03-15 LAB — CBC WITH DIFFERENTIAL/PLATELET
Basophils Absolute: 0.1 10*3/uL (ref 0–0.1)
Basophils Relative: 1 %
Eosinophils Absolute: 0.1 10*3/uL (ref 0–0.7)
Eosinophils Relative: 2 %
HCT: 40.5 % (ref 35.0–47.0)
HEMOGLOBIN: 13.1 g/dL (ref 12.0–16.0)
Lymphocytes Relative: 28 %
Lymphs Abs: 2.3 10*3/uL (ref 1.0–3.6)
MCH: 30 pg (ref 26.0–34.0)
MCHC: 32.5 g/dL (ref 32.0–36.0)
MCV: 92.4 fL (ref 80.0–100.0)
Monocytes Absolute: 0.6 10*3/uL (ref 0.2–0.9)
Monocytes Relative: 8 %
Neutro Abs: 5 10*3/uL (ref 1.4–6.5)
Neutrophils Relative %: 61 %
Platelets: 59 10*3/uL — ABNORMAL LOW (ref 150–440)
RBC: 4.38 MIL/uL (ref 3.80–5.20)
RDW: 15.9 % — AB (ref 11.5–14.5)
WBC: 8.1 10*3/uL (ref 3.6–11.0)

## 2015-03-17 ENCOUNTER — Other Ambulatory Visit: Payer: Self-pay | Admitting: *Deleted

## 2015-03-17 DIAGNOSIS — D693 Immune thrombocytopenic purpura: Secondary | ICD-10-CM

## 2015-03-19 ENCOUNTER — Other Ambulatory Visit: Payer: Self-pay | Admitting: Family Medicine

## 2015-03-19 ENCOUNTER — Inpatient Hospital Stay: Payer: Commercial Managed Care - HMO

## 2015-03-19 DIAGNOSIS — D693 Immune thrombocytopenic purpura: Secondary | ICD-10-CM

## 2015-03-19 DIAGNOSIS — Z79899 Other long term (current) drug therapy: Secondary | ICD-10-CM | POA: Diagnosis not present

## 2015-03-19 LAB — CBC WITH DIFFERENTIAL/PLATELET
Basophils Absolute: 0.1 10*3/uL (ref 0–0.1)
Basophils Relative: 1 %
Eosinophils Absolute: 0.2 10*3/uL (ref 0–0.7)
Eosinophils Relative: 3 %
HCT: 41.3 % (ref 35.0–47.0)
Hemoglobin: 13.3 g/dL (ref 12.0–16.0)
LYMPHS ABS: 2.2 10*3/uL (ref 1.0–3.6)
Lymphocytes Relative: 28 %
MCH: 29.9 pg (ref 26.0–34.0)
MCHC: 32.2 g/dL (ref 32.0–36.0)
MCV: 92.7 fL (ref 80.0–100.0)
Monocytes Absolute: 0.7 10*3/uL (ref 0.2–0.9)
Monocytes Relative: 8 %
Neutro Abs: 4.8 10*3/uL (ref 1.4–6.5)
Neutrophils Relative %: 60 %
Platelets: 35 10*3/uL — ABNORMAL LOW (ref 150–440)
RBC: 4.46 MIL/uL (ref 3.80–5.20)
RDW: 16.1 % — ABNORMAL HIGH (ref 11.5–14.5)
WBC: 8 10*3/uL (ref 3.6–11.0)

## 2015-03-19 MED ORDER — ROMIPLOSTIM 250 MCG ~~LOC~~ SOLR: 1.0000 ug/kg | Freq: Once | SUBCUTANEOUS | Status: DC

## 2015-03-19 MED ORDER — ROMIPLOSTIM 250 MCG ~~LOC~~ SOLR: 3.0000 ug/kg | Freq: Once | SUBCUTANEOUS | Status: DC

## 2015-03-19 MED ORDER — ROMIPLOSTIM 250 MCG ~~LOC~~ SOLR
3.0000 ug/kg | Freq: Once | SUBCUTANEOUS | Status: AC
  Administered 2015-03-19: 200 ug via SUBCUTANEOUS
  Filled 2015-03-19: qty 0.4

## 2015-03-20 ENCOUNTER — Ambulatory Visit: Payer: Commercial Managed Care - HMO

## 2015-03-20 ENCOUNTER — Ambulatory Visit: Payer: Commercial Managed Care - HMO | Admitting: Oncology

## 2015-03-20 ENCOUNTER — Ambulatory Visit: Payer: Commercial Managed Care - HMO | Admitting: Family Medicine

## 2015-03-20 ENCOUNTER — Other Ambulatory Visit: Payer: Commercial Managed Care - HMO

## 2015-03-22 ENCOUNTER — Ambulatory Visit: Payer: Commercial Managed Care - HMO | Admitting: Internal Medicine

## 2015-03-22 ENCOUNTER — Ambulatory Visit: Payer: Commercial Managed Care - HMO

## 2015-03-22 ENCOUNTER — Other Ambulatory Visit: Payer: Commercial Managed Care - HMO

## 2015-03-29 ENCOUNTER — Other Ambulatory Visit: Payer: Commercial Managed Care - HMO

## 2015-03-29 ENCOUNTER — Inpatient Hospital Stay: Payer: Commercial Managed Care - HMO

## 2015-03-29 ENCOUNTER — Ambulatory Visit: Payer: Commercial Managed Care - HMO | Admitting: Family Medicine

## 2015-03-29 ENCOUNTER — Ambulatory Visit: Payer: Commercial Managed Care - HMO

## 2015-03-29 DIAGNOSIS — Z79899 Other long term (current) drug therapy: Secondary | ICD-10-CM | POA: Diagnosis not present

## 2015-03-29 DIAGNOSIS — D693 Immune thrombocytopenic purpura: Secondary | ICD-10-CM | POA: Diagnosis not present

## 2015-03-29 LAB — CBC WITH DIFFERENTIAL/PLATELET
Basophils Absolute: 0.1 10*3/uL (ref 0–0.1)
Basophils Relative: 1 %
EOS PCT: 1 %
Eosinophils Absolute: 0.1 10*3/uL (ref 0–0.7)
HCT: 40.7 % (ref 35.0–47.0)
Hemoglobin: 13 g/dL (ref 12.0–16.0)
Lymphocytes Relative: 28 %
Lymphs Abs: 2.2 10*3/uL (ref 1.0–3.6)
MCH: 29.8 pg (ref 26.0–34.0)
MCHC: 32 g/dL (ref 32.0–36.0)
MCV: 93.3 fL (ref 80.0–100.0)
MONOS PCT: 9 %
Monocytes Absolute: 0.7 10*3/uL (ref 0.2–0.9)
Neutro Abs: 4.9 10*3/uL (ref 1.4–6.5)
Neutrophils Relative %: 61 %
Platelets: 233 10*3/uL (ref 150–440)
RBC: 4.36 MIL/uL (ref 3.80–5.20)
RDW: 15.9 % — ABNORMAL HIGH (ref 11.5–14.5)
WBC: 8 10*3/uL (ref 3.6–11.0)

## 2015-04-05 ENCOUNTER — Inpatient Hospital Stay: Payer: Commercial Managed Care - HMO

## 2015-04-05 DIAGNOSIS — D693 Immune thrombocytopenic purpura: Secondary | ICD-10-CM | POA: Diagnosis not present

## 2015-04-05 DIAGNOSIS — Z79899 Other long term (current) drug therapy: Secondary | ICD-10-CM | POA: Diagnosis not present

## 2015-04-05 LAB — CBC
HEMATOCRIT: 40 % (ref 35.0–47.0)
HEMOGLOBIN: 12.9 g/dL (ref 12.0–16.0)
MCH: 30 pg (ref 26.0–34.0)
MCHC: 32.2 g/dL (ref 32.0–36.0)
MCV: 93.2 fL (ref 80.0–100.0)
Platelets: 139 10*3/uL — ABNORMAL LOW (ref 150–440)
RBC: 4.3 MIL/uL (ref 3.80–5.20)
RDW: 15.7 % — ABNORMAL HIGH (ref 11.5–14.5)
WBC: 10.1 10*3/uL (ref 3.6–11.0)

## 2015-04-05 MED ORDER — ROMIPLOSTIM 250 MCG ~~LOC~~ SOLR
200.0000 ug | Freq: Once | SUBCUTANEOUS | Status: AC
  Administered 2015-04-05: 200 ug via SUBCUTANEOUS
  Filled 2015-04-05: qty 0.4

## 2015-04-12 ENCOUNTER — Inpatient Hospital Stay: Payer: Commercial Managed Care - HMO

## 2015-04-12 ENCOUNTER — Encounter: Payer: Self-pay | Admitting: Internal Medicine

## 2015-04-12 ENCOUNTER — Inpatient Hospital Stay (HOSPITAL_BASED_OUTPATIENT_CLINIC_OR_DEPARTMENT_OTHER): Payer: Commercial Managed Care - HMO | Admitting: Internal Medicine

## 2015-04-12 ENCOUNTER — Inpatient Hospital Stay: Payer: Commercial Managed Care - HMO | Attending: Internal Medicine

## 2015-04-12 VITALS — BP 120/78 | HR 59 | Temp 98.3°F | Resp 18 | Ht 63.0 in | Wt 147.8 lb

## 2015-04-12 DIAGNOSIS — M1990 Unspecified osteoarthritis, unspecified site: Secondary | ICD-10-CM | POA: Insufficient documentation

## 2015-04-12 DIAGNOSIS — B029 Zoster without complications: Secondary | ICD-10-CM | POA: Insufficient documentation

## 2015-04-12 DIAGNOSIS — Z7982 Long term (current) use of aspirin: Secondary | ICD-10-CM

## 2015-04-12 DIAGNOSIS — Z79899 Other long term (current) drug therapy: Secondary | ICD-10-CM

## 2015-04-12 DIAGNOSIS — K219 Gastro-esophageal reflux disease without esophagitis: Secondary | ICD-10-CM | POA: Insufficient documentation

## 2015-04-12 DIAGNOSIS — M810 Age-related osteoporosis without current pathological fracture: Secondary | ICD-10-CM | POA: Insufficient documentation

## 2015-04-12 DIAGNOSIS — L0390 Cellulitis, unspecified: Secondary | ICD-10-CM | POA: Insufficient documentation

## 2015-04-12 DIAGNOSIS — F418 Other specified anxiety disorders: Secondary | ICD-10-CM | POA: Insufficient documentation

## 2015-04-12 DIAGNOSIS — F32A Depression, unspecified: Secondary | ICD-10-CM | POA: Insufficient documentation

## 2015-04-12 DIAGNOSIS — G8929 Other chronic pain: Secondary | ICD-10-CM

## 2015-04-12 DIAGNOSIS — G2581 Restless legs syndrome: Secondary | ICD-10-CM | POA: Diagnosis not present

## 2015-04-12 DIAGNOSIS — R5383 Other fatigue: Secondary | ICD-10-CM

## 2015-04-12 DIAGNOSIS — M549 Dorsalgia, unspecified: Secondary | ICD-10-CM

## 2015-04-12 DIAGNOSIS — E780 Pure hypercholesterolemia: Secondary | ICD-10-CM | POA: Insufficient documentation

## 2015-04-12 DIAGNOSIS — F329 Major depressive disorder, single episode, unspecified: Secondary | ICD-10-CM | POA: Insufficient documentation

## 2015-04-12 DIAGNOSIS — D693 Immune thrombocytopenic purpura: Secondary | ICD-10-CM | POA: Diagnosis not present

## 2015-04-12 LAB — CBC
HCT: 40.3 % (ref 35.0–47.0)
HEMOGLOBIN: 12.7 g/dL (ref 12.0–16.0)
MCH: 29.6 pg (ref 26.0–34.0)
MCHC: 31.6 g/dL — ABNORMAL LOW (ref 32.0–36.0)
MCV: 93.7 fL (ref 80.0–100.0)
Platelets: 247 10*3/uL (ref 150–440)
RBC: 4.3 MIL/uL (ref 3.80–5.20)
RDW: 15.5 % — AB (ref 11.5–14.5)
WBC: 11.1 10*3/uL — AB (ref 3.6–11.0)

## 2015-04-19 ENCOUNTER — Inpatient Hospital Stay: Payer: Commercial Managed Care - HMO

## 2015-04-19 DIAGNOSIS — K219 Gastro-esophageal reflux disease without esophagitis: Secondary | ICD-10-CM | POA: Diagnosis not present

## 2015-04-19 DIAGNOSIS — E780 Pure hypercholesterolemia: Secondary | ICD-10-CM | POA: Diagnosis not present

## 2015-04-19 DIAGNOSIS — M810 Age-related osteoporosis without current pathological fracture: Secondary | ICD-10-CM | POA: Diagnosis not present

## 2015-04-19 DIAGNOSIS — M549 Dorsalgia, unspecified: Secondary | ICD-10-CM | POA: Diagnosis not present

## 2015-04-19 DIAGNOSIS — M1990 Unspecified osteoarthritis, unspecified site: Secondary | ICD-10-CM | POA: Diagnosis not present

## 2015-04-19 DIAGNOSIS — D693 Immune thrombocytopenic purpura: Secondary | ICD-10-CM

## 2015-04-19 DIAGNOSIS — R5383 Other fatigue: Secondary | ICD-10-CM | POA: Diagnosis not present

## 2015-04-19 DIAGNOSIS — G2581 Restless legs syndrome: Secondary | ICD-10-CM | POA: Diagnosis not present

## 2015-04-19 DIAGNOSIS — G8929 Other chronic pain: Secondary | ICD-10-CM | POA: Diagnosis not present

## 2015-04-19 LAB — CBC WITH DIFFERENTIAL/PLATELET
BASOS ABS: 0.1 10*3/uL (ref 0–0.1)
BASOS PCT: 1 %
Eosinophils Absolute: 0 10*3/uL (ref 0–0.7)
Eosinophils Relative: 0 %
HCT: 43.1 % (ref 35.0–47.0)
Hemoglobin: 13.8 g/dL (ref 12.0–16.0)
LYMPHS ABS: 1.5 10*3/uL (ref 1.0–3.6)
LYMPHS PCT: 9 %
MCH: 29.8 pg (ref 26.0–34.0)
MCHC: 32.1 g/dL (ref 32.0–36.0)
MCV: 92.7 fL (ref 80.0–100.0)
Monocytes Absolute: 0.2 10*3/uL (ref 0.2–0.9)
Monocytes Relative: 1 %
NEUTROS ABS: 14.1 10*3/uL — AB (ref 1.4–6.5)
Neutrophils Relative %: 89 %
PLATELETS: 330 10*3/uL (ref 150–440)
RBC: 4.64 MIL/uL (ref 3.80–5.20)
RDW: 15.1 % — ABNORMAL HIGH (ref 11.5–14.5)
WBC: 15.9 10*3/uL — AB (ref 3.6–11.0)

## 2015-04-19 MED ORDER — ROMIPLOSTIM 250 MCG ~~LOC~~ SOLR
2.0000 ug/kg | Freq: Once | SUBCUTANEOUS | Status: DC
  Filled 2015-04-19: qty 0.27

## 2015-04-22 DIAGNOSIS — E780 Pure hypercholesterolemia: Secondary | ICD-10-CM | POA: Diagnosis not present

## 2015-04-22 DIAGNOSIS — E039 Hypothyroidism, unspecified: Secondary | ICD-10-CM | POA: Diagnosis not present

## 2015-04-22 DIAGNOSIS — Z79899 Other long term (current) drug therapy: Secondary | ICD-10-CM | POA: Diagnosis not present

## 2015-04-22 DIAGNOSIS — I10 Essential (primary) hypertension: Secondary | ICD-10-CM | POA: Diagnosis not present

## 2015-04-23 NOTE — Progress Notes (Signed)
[place]  [phone] [phone]) [phone]     ID: Ariana [name] OB: 07/19/1933  MR#: [id number]  OVZ#:[id number]  Patient Care Team: Ariana Crouch, MD as PCP - General (Internal Medicine)  CHIEF COMPLAINT/DIAGNOSIS:  ITP (Immune Thrombocytopenic Purpura) s/p splenectomy in Aug 2007 - on Nplate therapy Ariana [name] if platelets is 50 or lower) by Dr.Gittin    HISTORY OF PRESENT ILLNESS:  patient returns for continued followup with repeat labs today, she is being seen in absence of Dr.Gittin. She is on Nplate therapy. States she has been doing steady, has chronic fatigue on exertion. States she gets severe bruising all over the legs when platelet drops to 50 or lower. Denies other obvious bleeding issues. Denies any progressive dyspnea, orthopnea, or paroxysmal nocturnal dyspnea. Denies angina, palpations, or dizziness. Denies fevers or night sweats.    REVIEW OF SYSTEMS:   ROS As in HPI above. In addition, no fever, chills or sweats. No new headaches or focal weakness.  No new mood disturbances. No  sore throat, cough, shortness of breath, sputum, hemoptysis or chest pain. No dizziness or palpitation. No abdominal pain, constipation, diarrhea, dysuria or hematuria. No new paresthesias in extremities.   PAST MEDICAL HISTORY: Reviewed. Past Medical History  Diagnosis Date  . Hypercholesteremia   . ITP (idiopathic thrombocytopenic purpura)   . Restless legs   . Anemia   . Depression   . Osteoarthritis   . GERD (gastroesophageal reflux disease)   . Hiatal hernia   . Chronic back pain   . Asthma   . Osteoporosis   . Depression   . Anxiety   . Pulmonary emboli   Past history includes lumbar disc disease asthma reflux osteoporosis DJD depression anxiety restless legs and surgical hysterectomy carpal tunnel esophageal dilatation back surgery and splenectomy in August 2007, PE prior on coumadin, coag w/u negative, asthma, prior cardiac cath.    PAST SURGICAL HISTORY: Reviewed. Past Surgical History  Procedure Laterality Date  . Splenectomy, partial    . Total abdominal hysterectomy    . Lumbar disc surgery    . Carpal tunnel release      FAMILY HISTORY: Reviewed. Family History  Problem Relation Age of Onset  . Stroke Mother     SOCIAL HISTORY: Reviewed. History  Substance Use Topics  . Smoking status: Never Smoker   . Smokeless tobacco: Not on file  . Alcohol Use: No    Allergies  Allergen Reactions  . Aspirin     Other reaction(s): Distress (finding)  . Diazepam     Other reaction(s): Itching of Skin sneezing  . Morphine     Other reaction(s): Itching of Skin  . Other     Other reaction(s): Unknown seasonal allergies  . Tetanus Toxoids     Other reaction(s): Localized superficial swelling of skin    Current Outpatient Prescriptions  Medication Sig Dispense Refill  . BUDESONIDE-FORMOTEROL FUMARATE IN Inhale into the lungs.    . Calcium Carbonate-Vitamin D 600-200 MG-UNIT CAPS Take 1 capsule by mouth daily.    . cetirizine (ZYRTEC) 10 MG tablet Take 1 tablet by mouth 1 day or 1 dose.    . clotrimazole-betamethasone (LOTRISONE) cream Apply topically.    . donepezil (ARICEPT) 5 MG tablet Take 5 mg by mouth.    . gabapentin (NEURONTIN) 300 MG capsule 1 po qid    . HYDROcodone-acetaminophen (NORCO/VICODIN) 5-325 MG per tablet Take 1 tab by mouth every 4-6 hours as needed for pain    .  Multiple Vitamins-Minerals (OCUVITE-LUTEIN PO) Take 1 capsule by mouth 1 day or 1 dose.    Marland Kitchen Spacer/Aero Chamber [employer] (or other brand) spacer for MDI Use spacer with MDI   Quantity: 1;  Refills: 0   Started 23-Dec-2014 Active    . traMADol (ULTRAM) 50 MG tablet Take 1 tablet by mouth 4 (four) times daily as needed. pain    . venlafaxine XR (EFFEXOR-XR) 37.5 MG 24 hr capsule Take 1 capsule by mouth 1 day or 1 dose.    . vitamin E 100 UNIT capsule Take 1 capsule by mouth 1 day or 1 dose.    . albuterol  (PROVENTIL HFA;VENTOLIN HFA) 108 (90 BASE) MCG/ACT inhaler Inhale 2 puffs into the lungs 4 (four) times daily as needed. As needed for shortness of breath    . Cyanocobalamin (RA VITAMIN B-12 TR) 1000 MCG TBCR Take by mouth.     No current facility-administered medications for this visit.    PHYSICAL EXAM: Filed Vitals:   04/12/15 1032  BP: 120/78  Pulse: 59  Temp: 98.3 F (36.8 C)  Resp: 18     Body mass index is 26.19 kg/(m^2).      GENERAL: Patient is alert and oriented and in no acute distress. There is no icterus. HEENT: EOMs intact. Oral exam negative for thrush or lesions. No cervical lymphadenopathy. LUNGS: Bilaterally clear to auscultation, no rhonchi. ABDOMEN: Soft, nontender. No hepatosplenomegaly clinically.  NEURO: grossly nonfocal, cranial nerves are intact.   EXTREMITIES: No pedal edema. Minor bruising.  LAB RESULTS: WBC 11.1, Hb 12.7, plts 247K.  STUDIES: Nov 2005 - ULTRASOUND ABDOMEN COMPLETE: Multiple scans of the entire abdomen are made and show the gallbladder to be normal with a wall thickness of 2.8 mm. The common bile duct is normal and measures 3.7 mm. The liver, inferior vena cava, and pancreas appear normal. The spleen is normal and measures 6.6 cm in length. The right and left kidneys are normal with the right measuring 11.0 cm and the left measuring 10.6 cm. The abdominal aorta is normal and measures 2.0 cm. IMPRESSION: Normal complete abdominal ultrasound.   ASSESSMENT / PLAN:   ITP (Immune Thrombocytopenic Purpura) s/p splenectomy in Aug 2007. On Nplate therapy (qweekly if platelets is 50 or lower) -  Have reviewed labs from today and d/w patient. She last got Nplate on 6/7 when count was 35K, since then platelet counts have been 233, 139, and is 247K today. She does not need Nplate dose today.Have explained the rationale, benefits and possible side effects of Nplate therapy including possibility of bone marrow reticulin formation and other bone marrow  disorders like AML (acute myeloid leukemia), she is agreeable to continue on Nplate therapy and expressed verbal consent. Plan is to continue to monitor CBC qweekly and give Nplate 2 mcg/kg SQ if platelet count is 50 or lower. Next MD f/u at 16 weeks with labs and make continued treatment planning. In between visits, she was advised to call or come to ER in case of fevers, bleeding, acute sickness or new symptoms. Patient is agreeable to this plan.     Leia Alf, MD   04/23/2015 12:27 AM

## 2015-04-26 ENCOUNTER — Inpatient Hospital Stay: Payer: Commercial Managed Care - HMO

## 2015-04-26 DIAGNOSIS — F419 Anxiety disorder, unspecified: Secondary | ICD-10-CM | POA: Diagnosis not present

## 2015-04-26 DIAGNOSIS — M1990 Unspecified osteoarthritis, unspecified site: Secondary | ICD-10-CM | POA: Diagnosis not present

## 2015-04-26 DIAGNOSIS — D693 Immune thrombocytopenic purpura: Secondary | ICD-10-CM

## 2015-04-26 DIAGNOSIS — I10 Essential (primary) hypertension: Secondary | ICD-10-CM | POA: Diagnosis not present

## 2015-04-26 DIAGNOSIS — M549 Dorsalgia, unspecified: Secondary | ICD-10-CM | POA: Diagnosis not present

## 2015-04-26 DIAGNOSIS — G2581 Restless legs syndrome: Secondary | ICD-10-CM | POA: Diagnosis not present

## 2015-04-26 DIAGNOSIS — E780 Pure hypercholesterolemia: Secondary | ICD-10-CM | POA: Diagnosis not present

## 2015-04-26 DIAGNOSIS — G8929 Other chronic pain: Secondary | ICD-10-CM | POA: Diagnosis not present

## 2015-04-26 DIAGNOSIS — M19012 Primary osteoarthritis, left shoulder: Secondary | ICD-10-CM | POA: Diagnosis not present

## 2015-04-26 DIAGNOSIS — E039 Hypothyroidism, unspecified: Secondary | ICD-10-CM | POA: Diagnosis not present

## 2015-04-26 DIAGNOSIS — R0602 Shortness of breath: Secondary | ICD-10-CM | POA: Diagnosis not present

## 2015-04-26 DIAGNOSIS — R5383 Other fatigue: Secondary | ICD-10-CM | POA: Diagnosis not present

## 2015-04-26 DIAGNOSIS — K219 Gastro-esophageal reflux disease without esophagitis: Secondary | ICD-10-CM | POA: Diagnosis not present

## 2015-04-26 DIAGNOSIS — M25512 Pain in left shoulder: Secondary | ICD-10-CM | POA: Diagnosis not present

## 2015-04-26 DIAGNOSIS — M810 Age-related osteoporosis without current pathological fracture: Secondary | ICD-10-CM | POA: Diagnosis not present

## 2015-04-26 LAB — CBC
HCT: 39.1 % (ref 35.0–47.0)
Hemoglobin: 12.6 g/dL (ref 12.0–16.0)
MCH: 29.8 pg (ref 26.0–34.0)
MCHC: 32.3 g/dL (ref 32.0–36.0)
MCV: 92.4 fL (ref 80.0–100.0)
PLATELETS: 112 10*3/uL — AB (ref 150–440)
RBC: 4.23 MIL/uL (ref 3.80–5.20)
RDW: 14.7 % — AB (ref 11.5–14.5)
WBC: 12.3 10*3/uL — ABNORMAL HIGH (ref 3.6–11.0)

## 2015-05-03 ENCOUNTER — Inpatient Hospital Stay: Payer: Commercial Managed Care - HMO

## 2015-05-03 DIAGNOSIS — E780 Pure hypercholesterolemia: Secondary | ICD-10-CM | POA: Diagnosis not present

## 2015-05-03 DIAGNOSIS — D693 Immune thrombocytopenic purpura: Secondary | ICD-10-CM | POA: Diagnosis not present

## 2015-05-03 DIAGNOSIS — M1990 Unspecified osteoarthritis, unspecified site: Secondary | ICD-10-CM | POA: Diagnosis not present

## 2015-05-03 DIAGNOSIS — G8929 Other chronic pain: Secondary | ICD-10-CM | POA: Diagnosis not present

## 2015-05-03 DIAGNOSIS — K219 Gastro-esophageal reflux disease without esophagitis: Secondary | ICD-10-CM | POA: Diagnosis not present

## 2015-05-03 DIAGNOSIS — G2581 Restless legs syndrome: Secondary | ICD-10-CM | POA: Diagnosis not present

## 2015-05-03 DIAGNOSIS — M810 Age-related osteoporosis without current pathological fracture: Secondary | ICD-10-CM | POA: Diagnosis not present

## 2015-05-03 DIAGNOSIS — R5383 Other fatigue: Secondary | ICD-10-CM | POA: Diagnosis not present

## 2015-05-03 DIAGNOSIS — M549 Dorsalgia, unspecified: Secondary | ICD-10-CM | POA: Diagnosis not present

## 2015-05-03 LAB — CBC WITH DIFFERENTIAL/PLATELET
BASOS ABS: 0.1 10*3/uL (ref 0–0.1)
Basophils Relative: 1 %
EOS ABS: 0.2 10*3/uL (ref 0–0.7)
EOS PCT: 3 %
HCT: 40.7 % (ref 35.0–47.0)
Hemoglobin: 13 g/dL (ref 12.0–16.0)
LYMPHS ABS: 2.8 10*3/uL (ref 1.0–3.6)
Lymphocytes Relative: 29 %
MCH: 30 pg (ref 26.0–34.0)
MCHC: 32.1 g/dL (ref 32.0–36.0)
MCV: 93.5 fL (ref 80.0–100.0)
MONO ABS: 0.7 10*3/uL (ref 0.2–0.9)
Monocytes Relative: 8 %
NEUTROS ABS: 5.6 10*3/uL (ref 1.4–6.5)
Neutrophils Relative %: 59 %
PLATELETS: 70 10*3/uL — AB (ref 150–440)
RBC: 4.35 MIL/uL (ref 3.80–5.20)
RDW: 15.2 % — AB (ref 11.5–14.5)
WBC: 9.4 10*3/uL (ref 3.6–11.0)

## 2015-05-03 MED ORDER — ROMIPLOSTIM 250 MCG ~~LOC~~ SOLR: 2.0000 ug/kg | Freq: Once | SUBCUTANEOUS | Status: DC

## 2015-05-10 ENCOUNTER — Inpatient Hospital Stay: Payer: Commercial Managed Care - HMO

## 2015-05-10 ENCOUNTER — Telehealth: Payer: Self-pay | Admitting: *Deleted

## 2015-05-10 DIAGNOSIS — D693 Immune thrombocytopenic purpura: Secondary | ICD-10-CM | POA: Diagnosis not present

## 2015-05-10 DIAGNOSIS — M549 Dorsalgia, unspecified: Secondary | ICD-10-CM | POA: Diagnosis not present

## 2015-05-10 DIAGNOSIS — G8929 Other chronic pain: Secondary | ICD-10-CM | POA: Diagnosis not present

## 2015-05-10 DIAGNOSIS — E780 Pure hypercholesterolemia: Secondary | ICD-10-CM | POA: Diagnosis not present

## 2015-05-10 DIAGNOSIS — M810 Age-related osteoporosis without current pathological fracture: Secondary | ICD-10-CM | POA: Diagnosis not present

## 2015-05-10 DIAGNOSIS — G2581 Restless legs syndrome: Secondary | ICD-10-CM | POA: Diagnosis not present

## 2015-05-10 DIAGNOSIS — K219 Gastro-esophageal reflux disease without esophagitis: Secondary | ICD-10-CM | POA: Diagnosis not present

## 2015-05-10 DIAGNOSIS — R5383 Other fatigue: Secondary | ICD-10-CM | POA: Diagnosis not present

## 2015-05-10 DIAGNOSIS — M1990 Unspecified osteoarthritis, unspecified site: Secondary | ICD-10-CM | POA: Diagnosis not present

## 2015-05-10 LAB — CBC WITH DIFFERENTIAL/PLATELET
Basophils Absolute: 0.1 10*3/uL (ref 0–0.1)
Eosinophils Absolute: 0.2 10*3/uL (ref 0–0.7)
Eosinophils Relative: 2 %
HCT: 42.2 % (ref 35.0–47.0)
Hemoglobin: 13.5 g/dL (ref 12.0–16.0)
Lymphocytes Relative: 30 %
Lymphs Abs: 2.4 10*3/uL (ref 1.0–3.6)
MCH: 30 pg (ref 26.0–34.0)
MCHC: 32.1 g/dL (ref 32.0–36.0)
MCV: 93.4 fL (ref 80.0–100.0)
Monocytes Absolute: 0.6 10*3/uL (ref 0.2–0.9)
NEUTROS ABS: 4.9 10*3/uL (ref 1.4–6.5)
Neutrophils Relative %: 59 %
Platelets: 28 10*3/uL — CL (ref 150–440)
RBC: 4.52 MIL/uL (ref 3.80–5.20)
RDW: 15.5 % — ABNORMAL HIGH (ref 11.5–14.5)
WBC: 8.2 10*3/uL (ref 3.6–11.0)

## 2015-05-10 MED ORDER — ROMIPLOSTIM 250 MCG ~~LOC~~ SOLR
137.0000 ug | Freq: Once | SUBCUTANEOUS | Status: AC
  Administered 2015-05-10: 135 ug via SUBCUTANEOUS
  Filled 2015-05-10: qty 0.27

## 2015-05-10 NOTE — Telephone Encounter (Signed)
Call from lab critical plt count 28, pt on schedule for lab and Nplate today. She will need Nplate based on MD order for guidelines, pt understands she needs injection today.

## 2015-05-17 ENCOUNTER — Inpatient Hospital Stay: Payer: Commercial Managed Care - HMO | Attending: Internal Medicine

## 2015-05-17 ENCOUNTER — Inpatient Hospital Stay: Payer: Commercial Managed Care - HMO

## 2015-05-17 DIAGNOSIS — Z79899 Other long term (current) drug therapy: Secondary | ICD-10-CM | POA: Insufficient documentation

## 2015-05-17 DIAGNOSIS — D693 Immune thrombocytopenic purpura: Secondary | ICD-10-CM

## 2015-05-17 LAB — CBC WITH DIFFERENTIAL/PLATELET
Basophils Absolute: 0.1 10*3/uL (ref 0–0.1)
Eosinophils Absolute: 0.3 10*3/uL (ref 0–0.7)
Eosinophils Relative: 3 %
HEMATOCRIT: 39.9 % (ref 35.0–47.0)
HEMOGLOBIN: 12.9 g/dL (ref 12.0–16.0)
LYMPHS ABS: 2.8 10*3/uL (ref 1.0–3.6)
MCH: 30 pg (ref 26.0–34.0)
MCHC: 32.3 g/dL (ref 32.0–36.0)
MCV: 92.9 fL (ref 80.0–100.0)
Monocytes Absolute: 0.8 10*3/uL (ref 0.2–0.9)
Neutro Abs: 5.4 10*3/uL (ref 1.4–6.5)
Neutrophils Relative %: 57 %
Platelets: 101 10*3/uL — ABNORMAL LOW (ref 150–440)
RBC: 4.29 MIL/uL (ref 3.80–5.20)
RDW: 15.2 % — ABNORMAL HIGH (ref 11.5–14.5)
WBC: 9.3 10*3/uL (ref 3.6–11.0)

## 2015-05-23 ENCOUNTER — Inpatient Hospital Stay: Payer: Commercial Managed Care - HMO

## 2015-05-23 DIAGNOSIS — D693 Immune thrombocytopenic purpura: Secondary | ICD-10-CM

## 2015-05-23 DIAGNOSIS — Z79899 Other long term (current) drug therapy: Secondary | ICD-10-CM | POA: Diagnosis not present

## 2015-05-24 ENCOUNTER — Inpatient Hospital Stay: Payer: Commercial Managed Care - HMO

## 2015-05-24 LAB — CBC WITH DIFFERENTIAL/PLATELET
Basophils Absolute: 0.1 10*3/uL (ref 0–0.1)
Basophils Relative: 1 %
EOS PCT: 2 %
Eosinophils Absolute: 0.2 10*3/uL (ref 0–0.7)
HEMATOCRIT: 41.6 % (ref 35.0–47.0)
HEMOGLOBIN: 13.7 g/dL (ref 12.0–16.0)
LYMPHS ABS: 2.6 10*3/uL (ref 1.0–3.6)
LYMPHS PCT: 25 %
MCH: 30.1 pg (ref 26.0–34.0)
MCHC: 32.8 g/dL (ref 32.0–36.0)
MCV: 91.9 fL (ref 80.0–100.0)
MONO ABS: 0.9 10*3/uL (ref 0.2–0.9)
Monocytes Relative: 8 %
Neutro Abs: 6.4 10*3/uL (ref 1.4–6.5)
Neutrophils Relative %: 63 %
Platelets: 253 10*3/uL (ref 150–440)
RBC: 4.53 MIL/uL (ref 3.80–5.20)
RDW: 14.9 % — ABNORMAL HIGH (ref 11.5–14.5)
WBC: 10.1 10*3/uL (ref 3.6–11.0)

## 2015-05-30 ENCOUNTER — Inpatient Hospital Stay: Payer: Commercial Managed Care - HMO

## 2015-05-30 DIAGNOSIS — D693 Immune thrombocytopenic purpura: Secondary | ICD-10-CM | POA: Diagnosis not present

## 2015-05-30 DIAGNOSIS — Z79899 Other long term (current) drug therapy: Secondary | ICD-10-CM | POA: Diagnosis not present

## 2015-05-30 LAB — CBC WITH DIFFERENTIAL/PLATELET
Basophils Absolute: 0.1 10*3/uL (ref 0–0.1)
Basophils Relative: 1 %
EOS ABS: 0.1 10*3/uL (ref 0–0.7)
EOS PCT: 1 %
HCT: 40.4 % (ref 35.0–47.0)
Hemoglobin: 13.2 g/dL (ref 12.0–16.0)
LYMPHS ABS: 2.5 10*3/uL (ref 1.0–3.6)
LYMPHS PCT: 29 %
MCH: 30.1 pg (ref 26.0–34.0)
MCHC: 32.6 g/dL (ref 32.0–36.0)
MCV: 92.5 fL (ref 80.0–100.0)
MONOS PCT: 9 %
Monocytes Absolute: 0.8 10*3/uL (ref 0.2–0.9)
Neutro Abs: 5.2 10*3/uL (ref 1.4–6.5)
Neutrophils Relative %: 60 %
PLATELETS: 58 10*3/uL — AB (ref 150–440)
RBC: 4.37 MIL/uL (ref 3.80–5.20)
RDW: 15 % — ABNORMAL HIGH (ref 11.5–14.5)
WBC: 8.7 10*3/uL (ref 3.6–11.0)

## 2015-05-30 MED ORDER — ROMIPLOSTIM 250 MCG ~~LOC~~ SOLR: 2.0000 ug/kg | Freq: Once | SUBCUTANEOUS | Status: DC

## 2015-05-31 ENCOUNTER — Inpatient Hospital Stay: Payer: Commercial Managed Care - HMO

## 2015-06-04 ENCOUNTER — Other Ambulatory Visit: Payer: Self-pay | Admitting: *Deleted

## 2015-06-04 DIAGNOSIS — D693 Immune thrombocytopenic purpura: Secondary | ICD-10-CM

## 2015-06-04 DIAGNOSIS — M62830 Muscle spasm of back: Secondary | ICD-10-CM | POA: Diagnosis not present

## 2015-06-04 DIAGNOSIS — M5416 Radiculopathy, lumbar region: Secondary | ICD-10-CM | POA: Diagnosis not present

## 2015-06-04 DIAGNOSIS — M5136 Other intervertebral disc degeneration, lumbar region: Secondary | ICD-10-CM | POA: Diagnosis not present

## 2015-06-06 ENCOUNTER — Inpatient Hospital Stay: Payer: Commercial Managed Care - HMO

## 2015-06-06 DIAGNOSIS — D693 Immune thrombocytopenic purpura: Secondary | ICD-10-CM | POA: Diagnosis not present

## 2015-06-06 DIAGNOSIS — Z79899 Other long term (current) drug therapy: Secondary | ICD-10-CM | POA: Diagnosis not present

## 2015-06-06 LAB — CBC WITH DIFFERENTIAL/PLATELET
BASOS PCT: 1 %
Basophils Absolute: 0.1 10*3/uL (ref 0–0.1)
EOS ABS: 0.1 10*3/uL (ref 0–0.7)
EOS PCT: 1 %
HEMATOCRIT: 39.2 % (ref 35.0–47.0)
Hemoglobin: 12.8 g/dL (ref 12.0–16.0)
Lymphocytes Relative: 21 %
Lymphs Abs: 2.5 10*3/uL (ref 1.0–3.6)
MCH: 29.9 pg (ref 26.0–34.0)
MCHC: 32.5 g/dL (ref 32.0–36.0)
MCV: 92.1 fL (ref 80.0–100.0)
MONO ABS: 0.7 10*3/uL (ref 0.2–0.9)
MONOS PCT: 6 %
NEUTROS ABS: 8.1 10*3/uL — AB (ref 1.4–6.5)
Neutrophils Relative %: 71 %
PLATELETS: 18 10*3/uL — AB (ref 150–440)
RBC: 4.26 MIL/uL (ref 3.80–5.20)
RDW: 14.8 % — AB (ref 11.5–14.5)
WBC: 11.4 10*3/uL — ABNORMAL HIGH (ref 3.6–11.0)

## 2015-06-06 MED ORDER — ROMIPLOSTIM 250 MCG ~~LOC~~ SOLR
134.0000 ug | Freq: Once | SUBCUTANEOUS | Status: AC
  Administered 2015-06-06: 134 ug via SUBCUTANEOUS
  Filled 2015-06-06: qty 0.27

## 2015-06-06 NOTE — Progress Notes (Signed)
Platelets of 18 - Reported to [facility] [place].

## 2015-06-07 ENCOUNTER — Inpatient Hospital Stay: Payer: Commercial Managed Care - HMO

## 2015-06-10 ENCOUNTER — Other Ambulatory Visit: Payer: Self-pay | Admitting: *Deleted

## 2015-06-10 DIAGNOSIS — D693 Immune thrombocytopenic purpura: Secondary | ICD-10-CM

## 2015-06-13 ENCOUNTER — Inpatient Hospital Stay: Payer: Commercial Managed Care - HMO

## 2015-06-13 ENCOUNTER — Inpatient Hospital Stay: Payer: Commercial Managed Care - HMO | Attending: Internal Medicine

## 2015-06-13 DIAGNOSIS — D693 Immune thrombocytopenic purpura: Secondary | ICD-10-CM | POA: Diagnosis not present

## 2015-06-13 DIAGNOSIS — R5383 Other fatigue: Secondary | ICD-10-CM | POA: Diagnosis not present

## 2015-06-13 DIAGNOSIS — Z9081 Acquired absence of spleen: Secondary | ICD-10-CM | POA: Diagnosis not present

## 2015-06-13 DIAGNOSIS — Z23 Encounter for immunization: Secondary | ICD-10-CM | POA: Diagnosis not present

## 2015-06-13 DIAGNOSIS — Z79899 Other long term (current) drug therapy: Secondary | ICD-10-CM | POA: Insufficient documentation

## 2015-06-13 LAB — CBC WITH DIFFERENTIAL/PLATELET
Basophils Absolute: 0.1 10*3/uL (ref 0–0.1)
Basophils Relative: 1 %
Eosinophils Absolute: 0.2 10*3/uL (ref 0–0.7)
Eosinophils Relative: 1 %
HEMATOCRIT: 40.7 % (ref 35.0–47.0)
HEMOGLOBIN: 13.1 g/dL (ref 12.0–16.0)
Lymphs Abs: 2.4 10*3/uL (ref 1.0–3.6)
MCH: 30.1 pg (ref 26.0–34.0)
MCHC: 32.2 g/dL (ref 32.0–36.0)
MCV: 93.4 fL (ref 80.0–100.0)
MONO ABS: 0.9 10*3/uL (ref 0.2–0.9)
NEUTROS ABS: 9 10*3/uL — AB (ref 1.4–6.5)
Platelets: 63 10*3/uL — ABNORMAL LOW (ref 150–400)
RBC: 4.36 MIL/uL (ref 3.80–5.20)
RDW: 14.9 % — AB (ref 11.5–14.5)
WBC: 12.6 10*3/uL — ABNORMAL HIGH (ref 3.6–11.0)

## 2015-06-14 ENCOUNTER — Inpatient Hospital Stay: Payer: Commercial Managed Care - HMO

## 2015-06-18 DIAGNOSIS — R413 Other amnesia: Secondary | ICD-10-CM | POA: Diagnosis not present

## 2015-06-20 ENCOUNTER — Inpatient Hospital Stay: Payer: Commercial Managed Care - HMO

## 2015-06-21 ENCOUNTER — Inpatient Hospital Stay: Payer: Commercial Managed Care - HMO

## 2015-06-26 DIAGNOSIS — D049 Carcinoma in situ of skin, unspecified: Secondary | ICD-10-CM | POA: Diagnosis not present

## 2015-06-26 DIAGNOSIS — D0430 Carcinoma in situ of skin of unspecified part of face: Secondary | ICD-10-CM | POA: Diagnosis not present

## 2015-06-26 DIAGNOSIS — L821 Other seborrheic keratosis: Secondary | ICD-10-CM | POA: Diagnosis not present

## 2015-06-26 DIAGNOSIS — L578 Other skin changes due to chronic exposure to nonionizing radiation: Secondary | ICD-10-CM | POA: Diagnosis not present

## 2015-06-27 ENCOUNTER — Inpatient Hospital Stay: Payer: Commercial Managed Care - HMO

## 2015-06-27 DIAGNOSIS — Z79899 Other long term (current) drug therapy: Secondary | ICD-10-CM | POA: Diagnosis not present

## 2015-06-27 DIAGNOSIS — Z23 Encounter for immunization: Secondary | ICD-10-CM | POA: Diagnosis not present

## 2015-06-27 DIAGNOSIS — D693 Immune thrombocytopenic purpura: Secondary | ICD-10-CM

## 2015-06-27 DIAGNOSIS — Z9081 Acquired absence of spleen: Secondary | ICD-10-CM | POA: Diagnosis not present

## 2015-06-27 DIAGNOSIS — R5383 Other fatigue: Secondary | ICD-10-CM | POA: Diagnosis not present

## 2015-06-27 LAB — CBC WITH DIFFERENTIAL/PLATELET
BASOS ABS: 0.1 10*3/uL (ref 0–0.1)
EOS ABS: 0.1 10*3/uL (ref 0–0.7)
HCT: 44.6 % (ref 35.0–47.0)
Hemoglobin: 14.5 g/dL (ref 12.0–16.0)
Lymphocytes Relative: 14 %
Lymphs Abs: 1.9 10*3/uL (ref 1.0–3.6)
MCH: 30.2 pg (ref 26.0–34.0)
MCHC: 32.6 g/dL (ref 32.0–36.0)
MCV: 92.5 fL (ref 80.0–100.0)
MONO ABS: 0.9 10*3/uL (ref 0.2–0.9)
NEUTROS ABS: 10.7 10*3/uL — AB (ref 1.4–6.5)
Neutrophils Relative %: 79 %
PLATELETS: 37 10*3/uL — AB (ref 150–440)
RBC: 4.82 MIL/uL (ref 3.80–5.20)
RDW: 14.7 % — AB (ref 11.5–14.5)
WBC: 13.6 10*3/uL — ABNORMAL HIGH (ref 3.6–11.0)

## 2015-06-27 MED ORDER — ROMIPLOSTIM 250 MCG ~~LOC~~ SOLR
134.0000 ug | Freq: Once | SUBCUTANEOUS | Status: AC
  Administered 2015-06-27: 134 ug via SUBCUTANEOUS
  Filled 2015-06-27: qty 0.27

## 2015-06-28 ENCOUNTER — Inpatient Hospital Stay: Payer: Commercial Managed Care - HMO

## 2015-06-29 ENCOUNTER — Emergency Department: Payer: Commercial Managed Care - HMO

## 2015-06-29 ENCOUNTER — Inpatient Hospital Stay
Admission: EM | Admit: 2015-06-29 | Discharge: 2015-07-02 | DRG: 813 | Disposition: A | Discharge status: Home Health | Payer: Commercial Managed Care - HMO | Attending: Internal Medicine | Admitting: Internal Medicine

## 2015-06-29 DIAGNOSIS — R531 Weakness: Secondary | ICD-10-CM | POA: Diagnosis present

## 2015-06-29 DIAGNOSIS — F329 Major depressive disorder, single episode, unspecified: Secondary | ICD-10-CM | POA: Diagnosis present

## 2015-06-29 DIAGNOSIS — R000 Tachycardia, unspecified: Secondary | ICD-10-CM | POA: Diagnosis not present

## 2015-06-29 DIAGNOSIS — W182XXA Fall in (into) shower or empty bathtub, initial encounter: Secondary | ICD-10-CM | POA: Diagnosis present

## 2015-06-29 DIAGNOSIS — J45909 Unspecified asthma, uncomplicated: Secondary | ICD-10-CM | POA: Diagnosis present

## 2015-06-29 DIAGNOSIS — Z86711 Personal history of pulmonary embolism: Secondary | ICD-10-CM

## 2015-06-29 DIAGNOSIS — R9082 White matter disease, unspecified: Secondary | ICD-10-CM | POA: Diagnosis not present

## 2015-06-29 DIAGNOSIS — K219 Gastro-esophageal reflux disease without esophagitis: Secondary | ICD-10-CM | POA: Diagnosis present

## 2015-06-29 DIAGNOSIS — D649 Anemia, unspecified: Secondary | ICD-10-CM | POA: Diagnosis not present

## 2015-06-29 DIAGNOSIS — R4182 Altered mental status, unspecified: Secondary | ICD-10-CM | POA: Diagnosis not present

## 2015-06-29 DIAGNOSIS — R197 Diarrhea, unspecified: Secondary | ICD-10-CM | POA: Diagnosis not present

## 2015-06-29 DIAGNOSIS — E780 Pure hypercholesterolemia: Secondary | ICD-10-CM | POA: Diagnosis present

## 2015-06-29 DIAGNOSIS — F0390 Unspecified dementia without behavioral disturbance: Secondary | ICD-10-CM | POA: Diagnosis present

## 2015-06-29 DIAGNOSIS — Y92002 Bathroom of unspecified non-institutional (private) residence single-family (private) house as the place of occurrence of the external cause: Secondary | ICD-10-CM

## 2015-06-29 DIAGNOSIS — G8929 Other chronic pain: Secondary | ICD-10-CM | POA: Diagnosis not present

## 2015-06-29 DIAGNOSIS — M1990 Unspecified osteoarthritis, unspecified site: Secondary | ICD-10-CM | POA: Diagnosis not present

## 2015-06-29 DIAGNOSIS — M810 Age-related osteoporosis without current pathological fracture: Secondary | ICD-10-CM | POA: Diagnosis present

## 2015-06-29 DIAGNOSIS — R9431 Abnormal electrocardiogram [ECG] [EKG]: Secondary | ICD-10-CM | POA: Diagnosis present

## 2015-06-29 DIAGNOSIS — S40022A Contusion of left upper arm, initial encounter: Secondary | ICD-10-CM | POA: Diagnosis present

## 2015-06-29 DIAGNOSIS — J180 Bronchopneumonia, unspecified organism: Secondary | ICD-10-CM | POA: Diagnosis not present

## 2015-06-29 DIAGNOSIS — Z9081 Acquired absence of spleen: Secondary | ICD-10-CM | POA: Diagnosis not present

## 2015-06-29 DIAGNOSIS — G2581 Restless legs syndrome: Secondary | ICD-10-CM | POA: Diagnosis present

## 2015-06-29 DIAGNOSIS — D696 Thrombocytopenia, unspecified: Secondary | ICD-10-CM

## 2015-06-29 DIAGNOSIS — F419 Anxiety disorder, unspecified: Secondary | ICD-10-CM | POA: Diagnosis present

## 2015-06-29 DIAGNOSIS — R51 Headache: Secondary | ICD-10-CM | POA: Diagnosis not present

## 2015-06-29 DIAGNOSIS — D693 Immune thrombocytopenic purpura: Principal | ICD-10-CM | POA: Diagnosis present

## 2015-06-29 DIAGNOSIS — R509 Fever, unspecified: Secondary | ICD-10-CM | POA: Diagnosis not present

## 2015-06-29 DIAGNOSIS — Z823 Family history of stroke: Secondary | ICD-10-CM | POA: Diagnosis not present

## 2015-06-29 DIAGNOSIS — M549 Dorsalgia, unspecified: Secondary | ICD-10-CM | POA: Diagnosis not present

## 2015-06-29 DIAGNOSIS — M542 Cervicalgia: Secondary | ICD-10-CM | POA: Diagnosis not present

## 2015-06-29 DIAGNOSIS — S40021A Contusion of right upper arm, initial encounter: Secondary | ICD-10-CM | POA: Diagnosis present

## 2015-06-29 DIAGNOSIS — N3090 Cystitis, unspecified without hematuria: Secondary | ICD-10-CM | POA: Diagnosis not present

## 2015-06-29 DIAGNOSIS — J441 Chronic obstructive pulmonary disease with (acute) exacerbation: Secondary | ICD-10-CM | POA: Diagnosis not present

## 2015-06-29 DIAGNOSIS — M545 Low back pain: Secondary | ICD-10-CM | POA: Diagnosis not present

## 2015-06-29 LAB — CBC WITH DIFFERENTIAL/PLATELET
BASOS ABS: 0 10*3/uL (ref 0–0.1)
Basophils Relative: 0 %
Eosinophils Absolute: 0 10*3/uL (ref 0–0.7)
Eosinophils Relative: 0 %
HEMATOCRIT: 41.5 % (ref 35.0–47.0)
HEMOGLOBIN: 13.5 g/dL (ref 12.0–16.0)
LYMPHS PCT: 4 %
Lymphs Abs: 0.5 10*3/uL — ABNORMAL LOW (ref 1.0–3.6)
MCH: 30.4 pg (ref 26.0–34.0)
MCHC: 32.5 g/dL (ref 32.0–36.0)
MCV: 93.6 fL (ref 80.0–100.0)
MONOS PCT: 6 %
Monocytes Absolute: 0.7 10*3/uL (ref 0.2–0.9)
Neutro Abs: 10.5 10*3/uL — ABNORMAL HIGH (ref 1.4–6.5)
Neutrophils Relative %: 90 %
Platelets: 4 10*3/uL — CL (ref 150–400)
RBC: 4.43 MIL/uL (ref 3.80–5.20)
RDW: 14.9 % — ABNORMAL HIGH (ref 11.5–14.5)
WBC: 11.7 10*3/uL — AB (ref 3.6–11.0)

## 2015-06-29 LAB — COMPREHENSIVE METABOLIC PANEL
ALT: 41 U/L (ref 14–54)
AST: 59 U/L — ABNORMAL HIGH (ref 15–41)
Albumin: 3.8 g/dL (ref 3.5–5.0)
Alkaline Phosphatase: 80 U/L (ref 38–126)
Anion gap: 7 (ref 5–15)
BUN: 19 mg/dL (ref 6–20)
CHLORIDE: 99 mmol/L — AB (ref 101–111)
CO2: 29 mmol/L (ref 22–32)
CREATININE: 0.92 mg/dL (ref 0.44–1.00)
Calcium: 8.7 mg/dL — ABNORMAL LOW (ref 8.9–10.3)
GFR, EST NON AFRICAN AMERICAN: 58 mL/min — AB (ref >60)
Glucose, Bld: 68 mg/dL (ref 65–99)
Potassium: 3.9 mmol/L (ref 3.5–5.1)
Sodium: 135 mmol/L (ref 135–145)
TOTAL PROTEIN: 6.4 g/dL — AB (ref 6.5–8.1)
Total Bilirubin: 1 mg/dL (ref 0.3–1.2)

## 2015-06-29 LAB — LACTIC ACID, PLASMA: Lactic Acid, Venous: 1.4 mmol/L (ref 0.5–2.0)

## 2015-06-29 LAB — TYPE AND SCREEN
ABO/RH(D): B POS
ANTIBODY SCREEN: NEGATIVE

## 2015-06-29 LAB — URINALYSIS COMPLETE WITH MICROSCOPIC (ARMC ONLY)
Bilirubin Urine: NEGATIVE
Glucose, UA: NEGATIVE mg/dL
Hgb urine dipstick: NEGATIVE
LEUKOCYTES UA: NEGATIVE
NITRITE: NEGATIVE
PH: 7 (ref 5.0–8.0)
PROTEIN: 30 mg/dL — AB
SPECIFIC GRAVITY, URINE: 1.029 (ref 1.005–1.030)
Squamous Epithelial / LPF: NONE SEEN
WBC UA: NONE SEEN WBC/hpf (ref 0–5)

## 2015-06-29 LAB — ABO/RH: ABO/RH(D): B POS

## 2015-06-29 MED ORDER — ALBUTEROL SULFATE (2.5 MG/3ML) 0.083% IN NEBU: 2.5000 mg | INHALATION_SOLUTION | RESPIRATORY_TRACT | Status: DC | PRN

## 2015-06-29 MED ORDER — DOCUSATE SODIUM 100 MG PO CAPS: 100.0000 mg | ORAL_CAPSULE | Freq: Two times a day (BID) | ORAL | Status: DC | PRN

## 2015-06-29 MED ORDER — PANTOPRAZOLE SODIUM 40 MG PO TBEC
40.0000 mg | DELAYED_RELEASE_TABLET | Freq: Every day | ORAL | Status: DC
  Administered 2015-06-30 – 2015-07-02 (×3): 40 mg via ORAL
  Filled 2015-06-29 (×3): qty 1

## 2015-06-29 MED ORDER — TRAMADOL HCL 50 MG PO TABS
50.0000 mg | ORAL_TABLET | Freq: Three times a day (TID) | ORAL | Status: DC | PRN
  Administered 2015-06-30 – 2015-07-01 (×3): 50 mg via ORAL
  Filled 2015-06-29 (×3): qty 1

## 2015-06-29 MED ORDER — ACETAMINOPHEN 325 MG PO TABS
650.0000 mg | ORAL_TABLET | Freq: Four times a day (QID) | ORAL | Status: DC | PRN
  Administered 2015-06-30: 02:00:00 650 mg via ORAL
  Filled 2015-06-29: qty 2

## 2015-06-29 MED ORDER — SODIUM CHLORIDE 0.9 % IV BOLUS (SEPSIS)
1000.0000 mL | Freq: Once | INTRAVENOUS | Status: AC
Start: 2015-06-29 — End: 2015-06-29
  Administered 2015-06-29: 1000 mL via INTRAVENOUS

## 2015-06-29 MED ORDER — DEXTROSE 5 % IV SOLN
1.0000 g | INTRAVENOUS | Status: DC
  Administered 2015-06-29 – 2015-06-30 (×2): 1 g via INTRAVENOUS
  Filled 2015-06-29 (×3): qty 10

## 2015-06-29 MED ORDER — ONDANSETRON HCL 4 MG/2ML IJ SOLN
4.0000 mg | Freq: Four times a day (QID) | INTRAMUSCULAR | Status: DC | PRN
  Administered 2015-06-30 – 2015-07-01 (×2): 4 mg via INTRAVENOUS
  Filled 2015-06-29 (×2): qty 2

## 2015-06-29 MED ORDER — ACETAMINOPHEN 500 MG PO TABS
ORAL_TABLET | ORAL | Status: AC
  Administered 2015-06-29: 500 mg via ORAL
  Filled 2015-06-29: qty 1

## 2015-06-29 MED ORDER — VENLAFAXINE HCL ER 37.5 MG PO CP24
37.5000 mg | ORAL_CAPSULE | Freq: Every day | ORAL | Status: DC
  Administered 2015-06-30 – 2015-07-02 (×3): 37.5 mg via ORAL
  Filled 2015-06-29 (×3): qty 1

## 2015-06-29 MED ORDER — CALCIUM CARBONATE-VITAMIN D 500-200 MG-UNIT PO TABS
1.0000 | ORAL_TABLET | Freq: Every day | ORAL | Status: DC
  Administered 2015-06-30 – 2015-07-02 (×3): 1 via ORAL
  Filled 2015-06-29 (×3): qty 1

## 2015-06-29 MED ORDER — SODIUM CHLORIDE 0.9 % IV SOLN
Freq: Once | INTRAVENOUS | Status: AC
  Administered 2015-06-30: 04:00:00 via INTRAVENOUS

## 2015-06-29 MED ORDER — ACETAMINOPHEN 500 MG PO TABS
500.0000 mg | ORAL_TABLET | Freq: Once | ORAL | Status: AC
  Administered 2015-06-29: 500 mg via ORAL

## 2015-06-29 MED ORDER — SODIUM CHLORIDE 0.9 % IJ SOLN
3.0000 mL | Freq: Two times a day (BID) | INTRAMUSCULAR | Status: DC
  Administered 2015-06-29 – 2015-07-01 (×6): 3 mL via INTRAVENOUS

## 2015-06-29 MED ORDER — ALBUTEROL SULFATE (2.5 MG/3ML) 0.083% IN NEBU: 3.0000 mL | INHALATION_SOLUTION | Freq: Four times a day (QID) | RESPIRATORY_TRACT | Status: DC | PRN

## 2015-06-29 MED ORDER — SENNA 8.6 MG PO TABS: 1.0000 | ORAL_TABLET | Freq: Every day | ORAL | Status: DC | PRN

## 2015-06-29 MED ORDER — ZOLPIDEM TARTRATE 5 MG PO TABS
5.0000 mg | ORAL_TABLET | Freq: Every evening | ORAL | Status: DC | PRN
  Administered 2015-06-30: 5 mg via ORAL
  Filled 2015-06-29: qty 1

## 2015-06-29 MED ORDER — PRAVASTATIN SODIUM 20 MG PO TABS
20.0000 mg | ORAL_TABLET | Freq: Every day | ORAL | Status: DC
Start: 2015-06-30 — End: 2015-07-02
  Administered 2015-06-30 – 2015-07-02 (×3): 20 mg via ORAL
  Filled 2015-06-29 (×3): qty 1

## 2015-06-29 MED ORDER — IMMUNE GLOBULIN (HUMAN) 10 GM/100ML IV SOLN
500.0000 mg/kg | INTRAVENOUS | Status: AC
  Administered 2015-06-29 – 2015-06-30 (×2): 30 g via INTRAVENOUS
  Filled 2015-06-29 (×2): qty 300

## 2015-06-29 MED ORDER — DONEPEZIL HCL 5 MG PO TABS: 5.0000 mg | ORAL_TABLET | Freq: Every day | ORAL | Status: DC

## 2015-06-29 MED ORDER — ONDANSETRON HCL 4 MG PO TABS: 4.0000 mg | ORAL_TABLET | Freq: Four times a day (QID) | ORAL | Status: DC | PRN

## 2015-06-29 MED ORDER — ACETAMINOPHEN 650 MG RE SUPP
650.0000 mg | Freq: Four times a day (QID) | RECTAL | Status: DC | PRN
Start: 2015-06-29 — End: 2015-07-02

## 2015-06-29 MED ORDER — CITALOPRAM HYDROBROMIDE 20 MG PO TABS
40.0000 mg | ORAL_TABLET | Freq: Every day | ORAL | Status: DC
Start: 2015-06-30 — End: 2015-07-02
  Administered 2015-06-30 – 2015-07-02 (×3): 40 mg via ORAL
  Filled 2015-06-29 (×3): qty 2

## 2015-06-29 MED ORDER — DONEPEZIL HCL 5 MG PO TABS
10.0000 mg | ORAL_TABLET | Freq: Every day | ORAL | Status: DC
  Administered 2015-06-30 – 2015-07-02 (×3): 10 mg via ORAL
  Filled 2015-06-29 (×3): qty 2

## 2015-06-29 MED ORDER — OCUVITE-LUTEIN PO CAPS
1.0000 | ORAL_CAPSULE | Freq: Every day | ORAL | Status: DC
  Administered 2015-06-30 – 2015-07-02 (×3): 1 via ORAL
  Filled 2015-06-29: qty 1
  Filled 2015-06-29: qty 7
  Filled 2015-06-29: qty 1

## 2015-06-29 MED ORDER — BUDESONIDE-FORMOTEROL FUMARATE 160-4.5 MCG/ACT IN AERO
2.0000 | INHALATION_SPRAY | Freq: Two times a day (BID) | RESPIRATORY_TRACT | Status: DC
  Administered 2015-07-01: 08:00:00 2 via RESPIRATORY_TRACT
  Filled 2015-06-29: qty 6

## 2015-06-29 MED ORDER — LEVOTHYROXINE SODIUM 50 MCG PO TABS
50.0000 ug | ORAL_TABLET | Freq: Every day | ORAL | Status: DC
  Administered 2015-06-30 – 2015-07-02 (×3): 50 ug via ORAL
  Filled 2015-06-29 (×3): qty 1

## 2015-06-29 MED ORDER — CYANOCOBALAMIN 500 MCG PO TABS
1000.0000 ug | ORAL_TABLET | Freq: Every day | ORAL | Status: DC
Start: 2015-06-30 — End: 2015-07-02
  Administered 2015-06-30 – 2015-07-02 (×3): 1000 ug via ORAL
  Filled 2015-06-29 (×3): qty 2

## 2015-06-29 MED ORDER — VITAMIN E 180 MG (400 UNIT) PO CAPS
400.0000 [IU] | ORAL_CAPSULE | Freq: Every day | ORAL | Status: DC
  Administered 2015-06-30 – 2015-07-02 (×3): 400 [IU] via ORAL
  Filled 2015-06-29 (×3): qty 1

## 2015-06-29 NOTE — ED Notes (Signed)
Assisted pt onto bed pan, and to reposition on stretcher after using bedpan. Pt is awake and alert with son at bedside. Will continue to monitor.

## 2015-06-29 NOTE — ED Notes (Signed)
Pt to be admitted per MD. No orders placed at this time. Will continue to monitor.

## 2015-06-29 NOTE — ED Notes (Signed)
Weight of pt provided to pharmacy. Pharmacy to call when IVIG is ready for administration. Will continue to monitor.

## 2015-06-29 NOTE — ED Notes (Signed)
Pt reports feeling short of breath at this time. VS assessed; VSS (O2 sat 97%). MD made aware of pt complaint. Will continue to monitor.

## 2015-06-29 NOTE — ED Notes (Signed)
Pt reporting continued headache after tylenol. MD made aware. Will continue to monitor.

## 2015-06-29 NOTE — H&P (Signed)
Ariana [name]    MR#:  [id number]  DATE OF BIRTH:  [birth year]  DATE OF ADMISSION:  06/29/2015  PRIMARY CARE PHYSICIAN: Idelle Crouch, MD   REQUESTING/REFERRING PHYSICIAN: Dr. Archie Balboa  CHIEF COMPLAINT:   Chief Complaint  Patient presents with  . Fever    HISTORY OF PRESENT ILLNESS:  Ariana [name]  is a 81 y.o. female with a known history of ITP, mild dementia, anemia, GERD, chronic back pain comes from home secondary to confusion, worsening falls and weakness.  Noted to have a fever of 102F here and platelets of 4k. plts 2 days ago were 37k. No active bleeding noted.Patient is confused and son at bedside providing most of the history, per him, patient was weak all day yesterday, had a fall last night. This morning was noted to be lethargic, having trouble breathing so brought to the emergency room. CT of the head is negative for any acute findings. Urine indicates possible infection.  PAST MEDICAL HISTORY:   Past Medical History  Diagnosis Date  . Hypercholesteremia   . ITP (idiopathic thrombocytopenic purpura)   . Restless legs   . Anemia   . Depression   . Osteoarthritis   . GERD (gastroesophageal reflux disease)   . Hiatal hernia   . Chronic back pain   . Asthma   . Osteoporosis   . Depression   . Anxiety   . Pulmonary emboli     PAST SURGICAL HISTORY:   Past Surgical History  Procedure Laterality Date  . Splenectomy, partial    . Total abdominal hysterectomy    . Lumbar disc surgery    . Carpal tunnel release      SOCIAL HISTORY:   Social History  Substance Use Topics  . Smoking status: Never Smoker   . Smokeless tobacco: Not on file  . Alcohol Use: No    FAMILY HISTORY:   Family History  Problem Relation Age of Onset  . Stroke Mother     DRUG ALLERGIES:   Allergies  Allergen Reactions  . Aspirin     Other reaction(s): Distress (finding)  . Diazepam      Other reaction(s): Itching of Skin sneezing  . Morphine     Other reaction(s): Itching of Skin  . Other     Other reaction(s): Unknown seasonal allergies  . Tetanus Toxoids     Other reaction(s): Localized superficial swelling of skin    REVIEW OF SYSTEMS:   Review of Systems  Unable to perform ROS: mental acuity    MEDICATIONS AT HOME:   Prior to Admission medications   Medication Sig Start Date End Date Taking? Authorizing Provider  albuterol (PROVENTIL HFA;VENTOLIN HFA) 108 (90 BASE) MCG/ACT inhaler Inhale 2 puffs into the lungs every 6 (six) hours as needed for wheezing or shortness of breath.   Yes Historical Provider, MD  BUDESONIDE-FORMOTEROL FUMARATE IN Inhale 2 puffs into the lungs 2 (two) times daily as needed (for shortness of breath and wheezing.).    Yes Historical Provider, MD  Calcium Carbonate-Vitamin D (CALCIUM 600+D) 600-400 MG-UNIT per tablet Take 1 tablet by mouth daily.   Yes Historical Provider, MD  citalopram (CELEXA) 40 MG tablet Take 40 mg by mouth daily.   Yes Historical Provider, MD  clotrimazole-betamethasone (LOTRISONE) cream Apply 1 application topically daily as needed (for rash.).  10/18/14 10/18/15 Yes Historical Provider, MD  Cyanocobalamin (RA VITAMIN B-12 TR) 1000 MCG TBCR Take 1,000 mcg  by mouth daily.    Yes Historical Provider, MD  donepezil (ARICEPT) 5 MG tablet Take 5 mg by mouth at bedtime.  10/18/14 10/18/15 Yes Historical Provider, MD  doxycycline (MONODOX) 100 MG capsule Take 100 mg by mouth 2 (two) times daily. X 10 days. 06/26/15 07/06/15 Yes Historical Provider, MD  gabapentin (NEURONTIN) 300 MG capsule 300mg  orally 4 times a day 07/23/14  Yes Historical Provider, MD  HYDROcodone-acetaminophen (NORCO/VICODIN) 5-325 MG per tablet Take 1 tab by mouth every 4-6 hours as needed for pain 02/13/15  Yes Historical Provider, MD  HYDROcodone-acetaminophen (NORCO/VICODIN) 5-325 MG per tablet Take 1 tablet by mouth See admin instructions. Take 1 tabley by mouth  every 4 to 6 hours as needed for pain. 06/26/15  Yes Historical Provider, MD  levothyroxine (SYNTHROID, LEVOTHROID) 50 MCG tablet Take 50 mcg by mouth daily before breakfast. Take 30 to 60 minutes before breakfast.   Yes Historical Provider, MD  Multiple Vitamins-Minerals (OCUVITE-LUTEIN PO) Take 1 capsule by mouth daily.    Yes Historical Provider, MD  omeprazole (PRILOSEC) 20 MG capsule Take 20 mg by mouth 2 (two) times daily.   Yes Historical Provider, MD  pravastatin (PRAVACHOL) 20 MG tablet Take 20 mg by mouth daily.   Yes Historical Provider, MD  traMADol (ULTRAM) 50 MG tablet Take 50 mg by mouth 3 (three) times daily as needed for moderate pain or severe pain. pain   Yes Historical Provider, MD  venlafaxine XR (EFFEXOR-XR) 37.5 MG 24 hr capsule Take 1 capsule by mouth daily.    Yes Historical Provider, MD  vitamin E (E-400) 400 UNIT capsule Take 400 Units by mouth daily.   Yes Historical Provider, MD  zolpidem (AMBIEN) 5 MG tablet Take 5 mg by mouth at bedtime as needed for sleep.   Yes Historical Provider, MD  albuterol (PROVENTIL HFA;VENTOLIN HFA) 108 (90 BASE) MCG/ACT inhaler Inhale 2 puffs into the lungs 4 (four) times daily as needed. As needed for shortness of breath 12/23/14 02/20/15  Historical Provider, MD      VITAL SIGNS:  Blood pressure 108/64, pulse 64, temperature 99.2 F (37.3 C), temperature source Oral, resp. rate 18, SpO2 96 %.  PHYSICAL EXAMINATION:   Physical Exam  GENERAL:  81 y.o.-year-old patient lying in the bed, appears confused.  EYES: Pupils equal, round, reactive to light and accommodation. No scleral icterus. Extraocular muscles intact.  HEENT: Head atraumatic, normocephalic. Oropharynx and nasopharynx clear. No bleeding from mucus membranes noted. NECK:  Supple, no jugular venous distention. No thyroid enlargement, no tenderness.  LUNGS: Normal breath sounds bilaterally, no wheezing, rales,rhonchi or crepitation. No use of accessory muscles of respiration.   CARDIOVASCULAR: S1, S2 normal. No murmurs, rubs, or gallops.  ABDOMEN: Soft, nontender, nondistended. Bowel sounds present. No organomegaly or mass.  EXTREMITIES: No pedal edema, cyanosis, or clubbing.  NEUROLOGIC: following commands, able to move all 4 extremities, sensation intact. Gait not checked. PSYCHIATRIC: The patient is alert but confused.  SKIN: large ecchymoses noted on extremities, petechiae on the abdomen/chest   LABORATORY PANEL:   CBC  Recent Labs Lab 06/29/15 1452  WBC 11.7*  HGB 13.5  HCT 41.5  PLT 4*   ------------------------------------------------------------------------------------------------------------------  Chemistries   Recent Labs Lab 06/29/15 1452  NA 135  K 3.9  CL 99*  CO2 29  GLUCOSE 68  BUN 19  CREATININE 0.92  CALCIUM 8.7*  AST 59*  ALT 41  ALKPHOS 80  BILITOT 1.0   ------------------------------------------------------------------------------------------------------------------  Cardiac Enzymes No results for input(s):  TROPONINI in the last 168 hours. ------------------------------------------------------------------------------------------------------------------  RADIOLOGY:  Ct Head Wo Contrast  06/29/2015   CLINICAL DATA:  Altered mental status, fall  EXAM: CT HEAD WITHOUT CONTRAST  TECHNIQUE: Contiguous axial images were obtained from the base of the skull through the vertex without intravenous contrast.  COMPARISON:  02/23/2014  FINDINGS: No skull fracture is noted. Paranasal sinuses and mastoid air cells are unremarkable. No intracranial hemorrhage, mass effect or midline shift. No acute infarction. No mass lesion is noted on this unenhanced scan. Stable mild cerebral atrophy. Stable mild periventricular chronic white matter disease. Ventricular size is stable from prior exam.  IMPRESSION: No acute intracranial abnormality. Stable atrophy and chronic white matter disease.   Electronically Signed   By: Lahoma Crocker M.D.   On:  06/29/2015 15:38   Dg Chest Portable 1 View  06/29/2015   CLINICAL DATA:  Fall, altered mental status  EXAM: PORTABLE CHEST - 1 VIEW  COMPARISON:  12/23/2014  FINDINGS: Cardiomediastinal silhouette is stable. No acute infiltrate or pleural effusion. No pulmonary edema. Stable old right rib fracture. No pneumothorax.  IMPRESSION: No active disease.   Electronically Signed   By: Lahoma Crocker M.D.   On: 06/29/2015 15:42    EKG:   Orders placed or performed in visit on 02/23/14  . EKG 12-Lead    IMPRESSION AND PLAN:   Ariana [name]  is a 81 y.o. female with a known history of ITP, mild dementia, anemia, GERD, chronic back pain comes from home secondary to confusion, worsening falls and weakness.   #1 Thrombocytopenia with ITP-platelet count of 4K - 2 days ago it was 37K, likely with the infection - oncology consulted, patient on N plate as outpatient - ITP was resistant to rituxan and steroids in past, pt had responded to IVIG - will start IVIG, plt transfusion as count<10k - 1hr post transfusion, platelet count ordered - No active bleeding at this time.  #2 Acute cystitis- urine cultures and blood cultures ordered - start on IV rocephin  #3 Asthma-stable breathing. No wheezing noted. -Continue home inhalers.  #4 chronic back pain-continue tramadol when necessary. -Hold the gabapentin and the Norco with her change in mental status at this time.  #5 dementia-more confused than baseline likely from her infection. -Continue Aricept.  #6 GERD-on PPI  #7 DVT prophylaxis-Ted's and SCDs. No antiplatelets agents or heparin products due to her thrombocytopenia.   All the records are reviewed and case discussed with ED provider. Management plans discussed with the patient, family and they are in agreement.  CODE STATUS: Full Code  TOTAL TIME TAKING CARE OF THIS PATIENT: 50 minutes.    Gladstone Lighter M.D on 06/29/2015 at 7:52 PM  Between 7am to 6pm - Pager -  [phone]  After 6pm go to [email or web address] - password [email or web address] [facility]  Office  [phone]  CC: Primary care physician; Idelle Crouch, MD

## 2015-06-29 NOTE — ED Notes (Signed)
Pt from home via EMS, pt c/o feeling terrible all day.  Per report pt had a fall last night, she was sitting on the side of the tub and fell back sitting her head. pts son states that patient is also more confused than normal today.  Per her son, pt was lethargic and weak and he and pts husband were unable to get pt out of bed.

## 2015-06-29 NOTE — ED Provider Notes (Signed)
[facility] Emergency Department Murel Shenberger Note  ____________________________________________  Time seen: 1505  I have reviewed the triage vital signs and the nursing notes.   HISTORY  Chief Complaint Fever   History limited by: Not Limited   HPI Ariana [name] is a 81 y.o. female who presents to the emergency department today via EMS for feeling unwell. Patient has a hard time describing what that means to her. The son is at bedside who does give slightly more history. Patient states that the patient had a fall yesterday in the bathtub she was sitting on the edge when she fell backwards. Does not sound like there was any loss of consciousness without fall. Today she has seemed a little more confused than normal. She was not aware that she had a fever today. She states she has had a little bit of nausea. Initially she states she has had some dysuria. She denies any chest pain, shortness breath or cough.   Past Medical History  Diagnosis Date  . Hypercholesteremia   . ITP (idiopathic thrombocytopenic purpura)   . Restless legs   . Anemia   . Depression   . Osteoarthritis   . GERD (gastroesophageal reflux disease)   . Hiatal hernia   . Chronic back pain   . Asthma   . Osteoporosis   . Depression   . Anxiety   . Pulmonary emboli     Patient Active Problem List   Diagnosis Date Noted  . Clinical depression 04/12/2015  . Herpes zona 04/12/2015  . ITP (idiopathic thrombocytopenic purpura) 03/15/2015  . Chest pain 10/22/2014  . Breathlessness on exertion 10/22/2014  . Osteopenia 10/18/2014  . Benign essential HTN 06/28/2014  . HLD (hyperlipidemia) 06/28/2014  . Essential hemorrhagic thrombocythemia 06/28/2014  . Acquired hypothyroidism 06/04/2014  . Anxiety and depression 06/04/2014  . Bursitis, trochanteric 03/27/2014  . DDD (degenerative disc disease), lumbar 02/27/2014  . Neuritis or radiculitis due to rupture of lumbar  intervertebral disc 02/27/2014    Past Surgical History  Procedure Laterality Date  . Splenectomy, partial    . Total abdominal hysterectomy    . Lumbar disc surgery    . Carpal tunnel release      Current Outpatient Rx  Name  Route  Sig  Dispense  Refill  . EXPIRED: albuterol (PROVENTIL HFA;VENTOLIN HFA) 108 (90 BASE) MCG/ACT inhaler   Inhalation   Inhale 2 puffs into the lungs 4 (four) times daily as needed. As needed for shortness of breath         . BUDESONIDE-FORMOTEROL FUMARATE IN   Inhalation   Inhale into the lungs.         . Calcium Carbonate-Vitamin D 600-200 MG-UNIT CAPS   Oral   Take 1 capsule by mouth daily.         . cetirizine (ZYRTEC) 10 MG tablet   Oral   Take 1 tablet by mouth 1 day or 1 dose.         . clotrimazole-betamethasone (LOTRISONE) cream   Topical   Apply topically.         . Cyanocobalamin (RA VITAMIN B-12 TR) 1000 MCG TBCR   Oral   Take by mouth.         . donepezil (ARICEPT) 5 MG tablet   Oral   Take 5 mg by mouth.         . gabapentin (NEURONTIN) 300 MG capsule      1 po qid         .  HYDROcodone-acetaminophen (NORCO/VICODIN) 5-325 MG per tablet      Take 1 tab by mouth every 4-6 hours as needed for pain         . Multiple Vitamins-Minerals (OCUVITE-LUTEIN PO)   Oral   Take 1 capsule by mouth 1 day or 1 dose.         Marland Kitchen Spacer/Aero Chamber [employer] (or other brand) spacer for MDI Use spacer with MDI   Quantity: 1;  Refills: 0   Started 23-Dec-2014 Active         . traMADol (ULTRAM) 50 MG tablet   Oral   Take 1 tablet by mouth 4 (four) times daily as needed. pain         . venlafaxine XR (EFFEXOR-XR) 37.5 MG 24 hr capsule   Oral   Take 1 capsule by mouth 1 day or 1 dose.         . vitamin E 100 UNIT capsule   Oral   Take 1 capsule by mouth 1 day or 1 dose.           Allergies Aspirin; Diazepam; Morphine; Other; and Tetanus toxoids  Family History  Problem  Relation Age of Onset  . Stroke Mother     Social History Social History  Substance Use Topics  . Smoking status: Never Smoker   . Smokeless tobacco: Not on file  . Alcohol Use: No    Review of Systems  Constitutional: Negative for fever. Cardiovascular: Negative for chest pain. Respiratory: Negative for shortness of breath. Gastrointestinal: Negative for abdominal pain, vomiting and diarrhea. Genitourinary: Positive for dysuria. Musculoskeletal: Negative for back pain. Skin: Negative for rash. Neurological: Positive for headache  10-point ROS otherwise negative.  ____________________________________________   PHYSICAL EXAM:  VITAL SIGNS: 74  14   122/57 mmHg  98 %   102.9  Constitutional: Alert and oriented. Appears mildly uncomfortable. Eyes: Conjunctivae are normal. PERRL. Normal extraocular movements. ENT   Head: Normocephalic and atraumatic.   Nose: No congestion/rhinnorhea.   Mouth/Throat: Mucous membranes are moist.   Neck: No stridor. Hematological/Lymphatic/Immunilogical: No cervical lymphadenopathy. Cardiovascular: Normal rate, regular rhythm.  No murmurs, rubs, or gallops. Respiratory: Normal respiratory effort without tachypnea nor retractions. Breath sounds are clear and equal bilaterally. No wheezes/rales/rhonchi. Gastrointestinal: Soft and nontender. No distention.  Genitourinary: Deferred Musculoskeletal: Normal range of motion in all extremities. No joint effusions.  No lower extremity tenderness nor edema. Neurologic:  Normal speech and language. No gross focal neurologic deficits are appreciated. Speech is normal.  Skin:  Skin is warm, dry and intact. Multiple bruises over skin Psychiatric: Mood and affect are normal. Speech and behavior are normal. Patient exhibits appropriate insight and judgment.  ____________________________________________    LABS (pertinent positives/negatives)  Labs Reviewed  CBC WITH  DIFFERENTIAL/PLATELET - Abnormal; Notable for the following:    WBC 11.7 (*)    RDW 14.9 (*)    Platelets 4 (*)    Neutro Abs 10.5 (*)    Lymphs Abs 0.5 (*)    All other components within normal limits  URINALYSIS COMPLETEWITH MICROSCOPIC ([facility] ONLY) - Abnormal; Notable for the following:    Color, Urine AMBER (*)    APPearance TURBID (*)    Ketones, ur TRACE (*)    Protein, ur 30 (*)    Bacteria, UA FEW (*)    All other components within normal limits  COMPREHENSIVE METABOLIC PANEL - Abnormal; Notable for the following:    Chloride 99 (*)  Calcium 8.7 (*)    Total Protein 6.4 (*)    AST 59 (*)    GFR calc non Af Amer 58 (*)    All other components within normal limits  CULTURE, BLOOD (ROUTINE X 2)  CULTURE, BLOOD (ROUTINE X 2)  LACTIC ACID, PLASMA  LACTIC ACID, PLASMA     ____________________________________________   EKG  I, Nance Pear, attending physician, personally viewed and interpreted this EKG  EKG Time: 1439 Rate: 72 Rhythm: sinus tachycardia Axis: left axis deviation Intervals: qtc 468 QRS: LBBB ST changes: no st elevation equivalent Impression: abnormal ekg   ____________________________________________    RADIOLOGY  CXR  IMPRESSION: No active disease.  CT head IMPRESSION: No acute intracranial abnormality. Stable atrophy and chronic white matter disease.  ____________________________________________   PROCEDURES  Procedure(s) performed: None  Critical Care performed: No  ____________________________________________   INITIAL IMPRESSION / ASSESSMENT AND PLAN / ED COURSE  Pertinent labs & imaging results that were available during my care of the patient were reviewed by me and considered in my medical decision making (see chart for details).  Patient presented to the emergency department today with feeling unwell and concern for fall yesterday in the bathtub. Patient does have a history of ITP. Head CT did not show any  bleed. Patient did have a fever upon arrival however urine and chest x-ray without obvious etiology of the fever. Of note the patient's platelets were found to be 4. I did touch base with the oncologist on-call who recommended admission. Given that the patient was not actively bleeding did not recommend any platelet transfusion at this time. Will plan on admitting the patient for severe thrombocytopenia.  ____________________________________________   FINAL CLINICAL IMPRESSION(S) / ED DIAGNOSES  Final diagnoses:  Thrombocytopenic     Nance Pear, MD 06/30/15 [id number]

## 2015-06-30 DIAGNOSIS — R197 Diarrhea, unspecified: Secondary | ICD-10-CM

## 2015-06-30 LAB — BASIC METABOLIC PANEL
ANION GAP: 4 — AB (ref 5–15)
BUN: 14 mg/dL (ref 6–20)
CALCIUM: 7.9 mg/dL — AB (ref 8.9–10.3)
CO2: 25 mmol/L (ref 22–32)
CREATININE: 0.83 mg/dL (ref 0.44–1.00)
Chloride: 102 mmol/L (ref 101–111)
Glucose, Bld: 107 mg/dL — ABNORMAL HIGH (ref 65–99)
Potassium: 3.8 mmol/L (ref 3.5–5.1)
Sodium: 131 mmol/L — ABNORMAL LOW (ref 135–145)

## 2015-06-30 LAB — CBC
HCT: 36.6 % (ref 35.0–47.0)
HEMOGLOBIN: 12.1 g/dL (ref 12.0–16.0)
MCH: 30.7 pg (ref 26.0–34.0)
MCHC: 33.1 g/dL (ref 32.0–36.0)
MCV: 93 fL (ref 80.0–100.0)
PLATELETS: 53 10*3/uL — AB (ref 150–440)
RBC: 3.93 MIL/uL (ref 3.80–5.20)
RDW: 14.8 % — ABNORMAL HIGH (ref 11.5–14.5)
WBC: 11.3 10*3/uL — ABNORMAL HIGH (ref 3.6–11.0)

## 2015-06-30 LAB — MAGNESIUM: MAGNESIUM: 2.2 mg/dL (ref 1.7–2.4)

## 2015-06-30 NOTE — Progress Notes (Addendum)
Verbal report given to Dr.Walker regarding pt frequency, small amounts of concentrated urine without pain/burning/bladder discomfort. Palpation of abdomen soft/nontender/unable to palpate bladder. Encouraged po intake with pt eating only few bites with encouragement/sips of liquids with encouragement. No new orders given at this time.

## 2015-06-30 NOTE — Progress Notes (Signed)
PT Cancellation Note  Patient Details Name: Amaree Loisel MRN: [id number] DOB: 1932/12/01   Cancelled Treatment:    Reason Eval/Treat Not Completed: Patient declined, no reason specified   Bertram Denver, PT, DPT, CWCE 06/30/2015, 1:38 PM

## 2015-06-30 NOTE — Progress Notes (Signed)
[facility]  Date of admission:  06/29/2015  Inpatient day:  06/30/2015  Consulting physician: Dr. Gladstone Lighter   Reason for Consultation:  Thrombocytopenia  Chief Complaint: Ariana [name] is a 81 y.o. female with chronic immune mediated, cytopenic purpura (ITP) sees who was admitted through the emergency room with a fever and a platelet count of 4,000 following a fall.  HPI: The patient has a history of ITP past 10 years.  She has had relatively refractory disease. She underwent splenectomy in 2007. Per review of records from Dr. Inez Pilgrim, she is refractory to prednisone, has no response to WinRho, and has no response to Rituxan.  She has responded to vincristine in the past. She is also responded to IVIG, but with a rapid decline in her platelet count.  She has been on N-plate since 40/34/7425.  She has recently been followed by Dr. Ma Hillock (last seen 04/12/2015).  The patient her platelet count checked weekly. Review of her labs to 07/88/2016 revealed a fluctuating platelet count. As she has received N-plate 134-135 mcg (2 mck/kg) on 3 occasions (07/29, 08/25 and 09/15)  during this interval.  Platelet count was 37,000 on 06/27/2015.  When she does not receive N-plate, her platelet count rapidly decreases.  The patient has had balance issues for the past week. She was sitting on the edge of her tub and fell backwards. She has chronic bruising which became worse yesterday following her fall. Yesterday afternoon, she felt cold and had chills.  She denies any runny nose, sore throat, cough, or urinary symptoms. She has had 2 episodes of diarrhea today. Temperature was 102.9 last evening.  She was seen in the emergency room. Head CT was negative. Chest x-ray was negative.  She received 2 platelet transfusions yesterday. One hour post platelet count was 53,000.  She notes bruising but no bleeding (epistaxis, gum bleeds, hematuria, melena or hematochezia).  Past  Medical History  Diagnosis Date  . Hypercholesteremia   . ITP (idiopathic thrombocytopenic purpura)   . Restless legs   . Anemia   . Depression   . Osteoarthritis   . GERD (gastroesophageal reflux disease)   . Hiatal hernia   . Chronic back pain   . Asthma   . Osteoporosis   . Depression   . Anxiety   . Pulmonary emboli     Past Surgical History  Procedure Laterality Date  . Splenectomy, partial    . Total abdominal hysterectomy    . Lumbar disc surgery    . Carpal tunnel release      Family History  Problem Relation Age of Onset  . Stroke Mother     Social History:  reports that she has never smoked. She does not have any smokeless tobacco history on file. She reports that she does not drink alcohol or use illicit drugs.  She is accompanied by her son and husband.  Allergies:  Allergies  Allergen Reactions  . Aspirin     Other reaction(s): Distress (finding)  . Diazepam     Other reaction(s): Itching of Skin sneezing  . Morphine     Other reaction(s): Itching of Skin  . Other     Other reaction(s): Unknown seasonal allergies  . Tetanus Toxoids     Other reaction(s): Localized superficial swelling of skin    Medications Prior to Admission  Medication Sig Dispense Refill  . albuterol (PROVENTIL HFA;VENTOLIN HFA) 108 (90 BASE) MCG/ACT inhaler Inhale 2 puffs into the lungs every 6 (six) hours  as needed for wheezing or shortness of breath.    . BUDESONIDE-FORMOTEROL FUMARATE IN Inhale 2 puffs into the lungs 2 (two) times daily as needed (for shortness of breath and wheezing.).     Marland Kitchen Calcium Carbonate-Vitamin D (CALCIUM 600+D) 600-400 MG-UNIT per tablet Take 1 tablet by mouth daily.    . citalopram (CELEXA) 40 MG tablet Take 40 mg by mouth daily.    . clotrimazole-betamethasone (LOTRISONE) cream Apply 1 application topically daily as needed (for rash.).     Marland Kitchen Cyanocobalamin (RA VITAMIN B-12 TR) 1000 MCG TBCR Take 1,000 mcg by mouth daily.     Marland Kitchen donepezil (ARICEPT)  5 MG tablet Take 5 mg by mouth at bedtime.     Marland Kitchen doxycycline (MONODOX) 100 MG capsule Take 100 mg by mouth 2 (two) times daily. X 10 days.    Marland Kitchen gabapentin (NEURONTIN) 300 MG capsule 376m orally 4 times a day    . HYDROcodone-acetaminophen (NORCO/VICODIN) 5-325 MG per tablet Take 1 tab by mouth every 4-6 hours as needed for pain    . HYDROcodone-acetaminophen (NORCO/VICODIN) 5-325 MG per tablet Take 1 tablet by mouth See admin instructions. Take 1 tabley by mouth every 4 to 6 hours as needed for pain.    .Marland Kitchenlevothyroxine (SYNTHROID, LEVOTHROID) 50 MCG tablet Take 50 mcg by mouth daily before breakfast. Take 30 to 60 minutes before breakfast.    . Multiple Vitamins-Minerals (OCUVITE-LUTEIN PO) Take 1 capsule by mouth daily.     .Marland Kitchenomeprazole (PRILOSEC) 20 MG capsule Take 20 mg by mouth 2 (two) times daily.    . pravastatin (PRAVACHOL) 20 MG tablet Take 20 mg by mouth daily.    . traMADol (ULTRAM) 50 MG tablet Take 50 mg by mouth 3 (three) times daily as needed for moderate pain or severe pain. pain    . venlafaxine XR (EFFEXOR-XR) 37.5 MG 24 hr capsule Take 1 capsule by mouth daily.     . vitamin E (E-400) 400 UNIT capsule Take 400 Units by mouth daily.    .Marland Kitchenzolpidem (AMBIEN) 5 MG tablet Take 5 mg by mouth at bedtime as needed for sleep.    .Marland Kitchenalbuterol (PROVENTIL HFA;VENTOLIN HFA) 108 (90 BASE) MCG/ACT inhaler Inhale 2 puffs into the lungs 4 (four) times daily as needed. As needed for shortness of breath      Review of Systems: GENERAL:  Fatigue.  Fever and chills yesterday. No weight loss. PERFORMANCE STATUS (ECOG):  2 HEENT:  No visual changes, runny nose, sore throat, mouth sores or tenderness. Lungs: No shortness of breath or cough.  No hemoptysis. Cardiac:  No chest pain, palpitations, orthopnea, or PND. GI:  No nausea, vomiting, diarrhea, constipation, melena or hematochezia. GU:  No urgency, frequency, dysuria, or hematuria. Musculoskeletal:  No back pain.  No joint pain.  No muscle  tenderness. Extremities:  No pain or swelling. Skin:  Multiple bruises.  No rashes. Neuro:  Forgetful.  Bance issues x 1 week.  No headache, numbness or weakness, or coordination issues. Endocrine:  No diabetes, thyroid issues, hot flashes or night sweats. Psych:  Episode of hallucinations at home.  No mood changes, depression or anxiety. Pain:  No focal pain. Review of systems:  All other systems reviewed and found to be negative.  Physical Exam:  Blood pressure 98/70, pulse 67, temperature 99.3 F (37.4 C), temperature source Oral, resp. rate 16, height _0  (1.676 m), weight 140 lb 4.8 oz (63.64 kg), SpO2 98 %.  GENERAL:  Fatigued appearing  woman sitting comfortably on the medical unit in no acute distress. MENTAL STATUS:  Alert and oriented to person, place and time. HEAD:  Brown somewhat disheveled hair.  Normocephalic, atraumatic, face symmetric, no Cushingoid features. EYES:  Haxel eyes.  Pupils equal round and reactive to light and accomodation.  No conjunctivitis or scleral icterus. ENT:  No mucosal bleeding.  Oropharynx clear without lesion.  Tongue normal. Mucous membranes moist.  RESPIRATORY:  Clear to auscultation without rales, wheezes or rhonchi. CARDIOVASCULAR:  Regular rate and rhythm without murmur, rub or gallop. ABDOMEN:  Soft, non-tender, with active bowel sounds, and no hepatomegaly.  S/p splenectomy.  No masses. SKIN:  Extensive ecchymosis upper and lower extremities. EXTREMITIES: No edema, no skin discoloration or tenderness.  No palpable cords. LYMPH NODES: No palpable cervical, supraclavicular, axillary or inguinal adenopathy  NEUROLOGICAL: Unremarkable. PSYCH:  Appropriate.  Results for orders placed or performed during the hospital encounter of 06/29/15 (from the past 48 hour(s))  Type and screen     Status: None   Collection Time: 06/28/15  8:29 PM  Result Value Ref Range   ABO/RH(D) B POS    Antibody Screen NEG    Sample Expiration 07/01/2015   CBC with  Differential     Status: Abnormal   Collection Time: 06/29/15  2:52 PM  Result Value Ref Range   WBC 11.7 (H) 3.6 - 11.0 K/uL   RBC 4.43 3.80 - 5.20 MIL/uL   Hemoglobin 13.5 12.0 - 16.0 g/dL   HCT 41.5 35.0 - 47.0 %   MCV 93.6 80.0 - 100.0 fL   MCH 30.4 26.0 - 34.0 pg   MCHC 32.5 32.0 - 36.0 g/dL   RDW 14.9 (H) 11.5 - 14.5 %   Platelets 4 (LL) 150 - 400 K/uL    Comment: PLATELET COUNT CONFIRMED BY SMEAR CRITICAL RESULT CALLED TO, READ BACK BY AND VERIFIED WITH: BRANDY DAVIS,RN 06/29/2015 1610 BY JRS.    Neutrophils Relative % 90 %   Lymphocytes Relative 4 %   Monocytes Relative 6 %   Eosinophils Relative 0 %   Basophils Relative 0 %   Neutro Abs 10.5 (H) 1.4 - 6.5 K/uL   Lymphs Abs 0.5 (L) 1.0 - 3.6 K/uL   Monocytes Absolute 0.7 0.2 - 0.9 K/uL   Eosinophils Absolute 0.0 0 - 0.7 K/uL   Basophils Absolute 0.0 0 - 0.1 K/uL  Lactic acid, plasma     Status: None   Collection Time: 06/29/15  2:52 PM  Result Value Ref Range   Lactic Acid, Venous 1.4 0.5 - 2.0 mmol/L  Urinalysis complete, with microscopic ([facility] only)     Status: Abnormal   Collection Time: 06/29/15  2:52 PM  Result Value Ref Range   Color, Urine AMBER (A) YELLOW   APPearance TURBID (A) CLEAR   Glucose, UA NEGATIVE NEGATIVE mg/dL   Bilirubin Urine NEGATIVE NEGATIVE   Ketones, ur TRACE (A) NEGATIVE mg/dL   Specific Gravity, Urine 1.029 1.005 - 1.030   Hgb urine dipstick NEGATIVE NEGATIVE   pH 7.0 5.0 - 8.0   Protein, ur 30 (A) NEGATIVE mg/dL   Nitrite NEGATIVE NEGATIVE   Leukocytes, UA NEGATIVE NEGATIVE   RBC / HPF TOO NUMEROUS TO COUNT 0 - 5 RBC/hpf   WBC, UA NONE SEEN 0 - 5 WBC/hpf   Bacteria, UA FEW (A) NONE SEEN   Squamous Epithelial / LPF NONE SEEN NONE SEEN   Mucous PRESENT    Amorphous Crystal PRESENT   Comprehensive metabolic panel  Status: Abnormal   Collection Time: 06/29/15  2:52 PM  Result Value Ref Range   Sodium 135 135 - 145 mmol/L   Potassium 3.9 3.5 - 5.1 mmol/L   Chloride 99 (L) 101 -  111 mmol/L   CO2 29 22 - 32 mmol/L   Glucose, Bld 68 65 - 99 mg/dL   BUN 19 6 - 20 mg/dL   Creatinine, Ser 0.92 0.44 - 1.00 mg/dL   Calcium 8.7 (L) 8.9 - 10.3 mg/dL   Total Protein 6.4 (L) 6.5 - 8.1 g/dL   Albumin 3.8 3.5 - 5.0 g/dL   AST 59 (H) 15 - 41 U/L   ALT 41 14 - 54 U/L   Alkaline Phosphatase 80 38 - 126 U/L   Total Bilirubin 1.0 0.3 - 1.2 mg/dL   GFR calc non Af Amer 58 (L) >60 mL/min   GFR calc Af Amer >60 >60 mL/min    Comment: (NOTE) The eGFR has been calculated using the CKD EPI equation. This calculation has not been validated in all clinical situations. eGFR's persistently <60 mL/min signify possible Chronic Kidney Disease.    Anion gap 7 5 - 15  Blood culture (routine x 2)     Status: None (Preliminary result)   Collection Time: 06/29/15  2:52 PM  Result Value Ref Range   Specimen Description BLOOD LEFT WRIST    Special Requests BOTTLES DRAWN AEROBIC AND ANAEROBIC  1CC    Culture NO GROWTH < 24 HOURS    Report Status PENDING   Blood culture (routine x 2)     Status: None (Preliminary result)   Collection Time: 06/29/15  3:41 PM  Result Value Ref Range   Specimen Description BLOOD LEFT ASSIST CONTROL    Special Requests BOTTLES DRAWN AEROBIC AND ANAEROBIC  1CC    Culture NO GROWTH < 24 HOURS    Report Status PENDING   ABO/Rh     Status: None   Collection Time: 06/29/15  8:10 PM  Result Value Ref Range   ABO/RH(D) B POS   Prepare Pheresed Platelets     Status: None (Preliminary result)   Collection Time: 06/29/15  8:30 PM  Result Value Ref Range   Unit Number [id number]    Blood Component Type PLTPHER LR2    Unit division 00    Status of Unit ISSUED    Transfusion Status OK TO TRANSFUSE    Unit Number [id number]    Blood Component Type PLTPHER LR1    Unit division 00    Status of Unit REL FROM [facility]    Transfusion Status OK TO TRANSFUSE   Basic metabolic panel     Status: Abnormal   Collection Time: 06/30/15  3:37 AM  Result Value Ref Range    Sodium 131 (L) 135 - 145 mmol/L   Potassium 3.8 3.5 - 5.1 mmol/L   Chloride 102 101 - 111 mmol/L   CO2 25 22 - 32 mmol/L   Glucose, Bld 107 (H) 65 - 99 mg/dL   BUN 14 6 - 20 mg/dL   Creatinine, Ser 0.83 0.44 - 1.00 mg/dL   Calcium 7.9 (L) 8.9 - 10.3 mg/dL   GFR calc non Af Amer >60 >60 mL/min   GFR calc Af Amer >60 >60 mL/min    Comment: (NOTE) The eGFR has been calculated using the CKD EPI equation. This calculation has not been validated in all clinical situations. eGFR's persistently <60 mL/min signify possible Chronic Kidney Disease.    Anion gap  4 (L) 5 - 15  Magnesium     Status: None   Collection Time: 06/30/15  3:37 AM  Result Value Ref Range   Magnesium 2.2 1.7 - 2.4 mg/dL  CBC     Status: Abnormal   Collection Time: 06/30/15  5:28 AM  Result Value Ref Range   WBC 11.3 (H) 3.6 - 11.0 K/uL   RBC 3.93 3.80 - 5.20 MIL/uL   Hemoglobin 12.1 12.0 - 16.0 g/dL   HCT 36.6 35.0 - 47.0 %   MCV 93.0 80.0 - 100.0 fL   MCH 30.7 26.0 - 34.0 pg   MCHC 33.1 32.0 - 36.0 g/dL   RDW 14.8 (H) 11.5 - 14.5 %   Platelets 53 (L) 150 - 440 K/uL    Comment: PLATELET COUNT CONFIRMED BY SMEAR   Ct Head Wo Contrast  06/29/2015   CLINICAL DATA:  Altered mental status, fall  EXAM: CT HEAD WITHOUT CONTRAST  TECHNIQUE: Contiguous axial images were obtained from the base of the skull through the vertex without intravenous contrast.  COMPARISON:  02/23/2014  FINDINGS: No skull fracture is noted. Paranasal sinuses and mastoid air cells are unremarkable. No intracranial hemorrhage, mass effect or midline shift. No acute infarction. No mass lesion is noted on this unenhanced scan. Stable mild cerebral atrophy. Stable mild periventricular chronic white matter disease. Ventricular size is stable from prior exam.  IMPRESSION: No acute intracranial abnormality. Stable atrophy and chronic white matter disease.   Electronically Signed   By: Lahoma Crocker M.D.   On: 06/29/2015 15:38   Dg Chest Portable 1  View  06/29/2015   CLINICAL DATA:  Fall, altered mental status  EXAM: PORTABLE CHEST - 1 VIEW  COMPARISON:  12/23/2014  FINDINGS: Cardiomediastinal silhouette is stable. No acute infiltrate or pleural effusion. No pulmonary edema. Stable old right rib fracture. No pneumothorax.  IMPRESSION: No active disease.   Electronically Signed   By: Lahoma Crocker M.D.   On: 06/29/2015 15:42    Assessment:  The patient is a 81 y.o. woman with chronic immune mediated thrombocytopenic purpura (ITP) who was admitted with a platelet count of 4, 000 and a fever.  Her thrombocytopenia is unresponsive to steroids, Rituxan, and WhinRho.  Platelets are transiently responsive to IVIG.  She recieves N-plate in the outpatient department.  She has a fever of unclear etiology. Chest x-ray is negative.  Blood cultures are negative to date. She is on empiric ceftriaxone.    Plan:   1.  Daily CBC with diff. 2.  Complete second dose of IVIG tonight (0.5 gm/kg IV) to complete 1 gm/kg. 3.  If bleeding, transfuse platelets. 4.  Anticipate continuation of N-plate weekly in outpatient department (dose adjusted). 5.  Follow cultures.  Blood cultures q 24 hours prn temp >= 100.4 6.  Consult social work.  Patient will need home health care at discharge.  Thank you for allowing me to participate in Zahara Rembert 's care.  I will follow her closely with you while hospitalized until Dr. Beverly Gust return.   Lequita Asal, MD  06/30/2015, 11:31 AM

## 2015-06-30 NOTE — Plan of Care (Signed)
Problem: Discharge Progression Outcomes Goal: Other Discharge Outcomes/Goals 1. Generalized aching "all over" with tramadol effective. 2. VSS. Afebrile. Platelets last check-53. No sign/symptom currently of active bleeding. 3. No identified complications this shift. 4. Poor appetite- taking sips/few bites and tolerated.  5. Bedrest with 1+ with BSC use done. Required less assistance with transfers this p.m. Family member at bedside most of day.

## 2015-06-30 NOTE — Progress Notes (Signed)
Patient ID: Ariana [name], female   DOB: 1933-04-03, 82 y.o.   MRN: [id number]  Ariana [name] is a 81 y.o. female  With ITP admitted with fever and confusion.  ______________________________________________________________________  ROS: Review of systems is unremarkable for any active cardiac,respiratory, GI, GU, hematologic, neurologic or psychiatric systems, 10 systems reviewed.  . budesonide-formoterol  2 puff Inhalation BID  . calcium-vitamin D  1 tablet Oral Daily  . cefTRIAXone (ROCEPHIN)  IV  1 g Intravenous Q24H  . citalopram  40 mg Oral Daily  . cyanocobalamin  1,000 mcg Oral Daily  . donepezil  10 mg Oral Daily  . Immune Globulin 10%  500 mg/kg Intravenous Q24 Hr x 2  . levothyroxine  50 mcg Oral QAC breakfast  . multivitamin-lutein  1 capsule Oral Daily  . pantoprazole  40 mg Oral QAC breakfast  . pravastatin  20 mg Oral Daily  . sodium chloride  3 mL Intravenous Q12H  . venlafaxine XR  37.5 mg Oral Daily  . vitamin E  400 Units Oral Daily   acetaminophen **OR** acetaminophen, albuterol, albuterol, docusate sodium, ondansetron **OR** ondansetron (ZOFRAN) IV, senna, traMADol, zolpidem   Past Medical History  Diagnosis Date  . Hypercholesteremia   . ITP (idiopathic thrombocytopenic purpura)   . Restless legs   . Anemia   . Depression   . Osteoarthritis   . GERD (gastroesophageal reflux disease)   . Hiatal hernia   . Chronic back pain   . Asthma   . Osteoporosis   . Depression   . Anxiety   . Pulmonary emboli     Past Surgical History  Procedure Laterality Date  . Splenectomy, partial    . Total abdominal hysterectomy    . Lumbar disc surgery    . Carpal tunnel release      PHYSICAL EXAM:  BP 106/66 mmHg  Pulse 60  Temp(Src) 98.7 F (37.1 C) (Oral)  Resp 20  Ht 5\' 6"  (1.676 m)  Wt 63.64 kg (140 lb 4.8 oz)  BMI 22.66 kg/m2  SpO2 97%  Wt Readings from Last 3 Encounters:  06/29/15 63.64 kg (140 lb 4.8 oz)  04/12/15 67.05 kg (147 lb  13.1 oz)  02/28/15 66.2 kg (145 lb 15.1 oz)           BP Readings from Last 3 Encounters:  06/30/15 106/66  04/12/15 120/78  02/28/15 133/79    Constitutional: NAD Neck: supple, no thyromegaly Respiratory: CTA, no rales or wheezes Cardiovascular: RRR, no murmur, no gallop Abdomen: soft, good BS, nontender Extremities: no edema Neuro: alert and oriented, no focal motor or sensory deficits  ASSESSMENT/PLAN:  Labs and imaging studies were reviewed  PLAN: Possible sepsis--blood cultures negative at 24 hour, urine pending; on Rocephin. ITP--platelet count improved after IVIG and platelet transfusion.  Dr. Gilford Rile

## 2015-06-30 NOTE — Progress Notes (Signed)
   06/30/15 1015  Clinical Encounter Type  Visited With Patient and family together  Visit Type Initial  Referral From Nurse  Consult/Referral To Chaplain  Spiritual Encounters  Spiritual Needs Literature  Stress Factors  Patient Stress Factors Exhausted;Health changes  Family Stress Factors Health changes  Advance Directives (For Healthcare)  Does patient have an advance directive? No  Would patient like information on creating an advanced directive? Yes - Scientist, clinical (histocompatibility and immunogenetics) given (Patient declined completing AD at this time.)  Met w/patient & family. Provided AD education. Patient declined pursuing AD at this time. Advised to notify nursing staff if patient later would like to continue.  [place]. [place]

## 2015-06-30 NOTE — Plan of Care (Signed)
Problem: Discharge Progression Outcomes Goal: Other Discharge Outcomes/Goals Outcome: Progressing Plan of care progress to goals: Pain controlled- pt has no c/o pain at present. Hemodynamically stable- elevated temp resolved with no intervention, platelets 4 at time of admission with order for IVIG stat and 1 unit of platelets to be transfused.  Complications resolved- elevated temperature resolved with no intervention, continue to monitor.  Tolerating diet- regular diet ordered, pt up after meals. Pt did c/o nausea, improved with PRN zofran. Activity appropriated for discharge- continue to monitor, pt very weak and unsteady while ambulating.  A&O patient admitted to room 113 with thrombocytopenia, platelet count at 4. Order for IVIG stat, titrated dose per policy and verified dose with pharmacy; also order to transfuse 1 unit of platelets. Scattered bruising over bilateral upper and lower extremities, otherwise skin intact. High fall risk with bed alarm activated and pt instructed to call for assistance with ambulating or toileting.

## 2015-07-01 LAB — PREPARE PLATELET PHERESIS
UNIT DIVISION: 0
Unit division: 0

## 2015-07-01 LAB — URINALYSIS COMPLETE WITH MICROSCOPIC (ARMC ONLY)
BACTERIA UA: NONE SEEN
Bilirubin Urine: NEGATIVE
Glucose, UA: NEGATIVE mg/dL
HGB URINE DIPSTICK: NEGATIVE
LEUKOCYTES UA: NEGATIVE
NITRITE: NEGATIVE
PH: 7 (ref 5.0–8.0)
PROTEIN: NEGATIVE mg/dL
SPECIFIC GRAVITY, URINE: 1.004 — AB (ref 1.005–1.030)

## 2015-07-01 LAB — CBC WITH DIFFERENTIAL/PLATELET
Basophils Absolute: 0 10*3/uL (ref 0–0.1)
Basophils Relative: 0 %
Eosinophils Absolute: 0.1 10*3/uL (ref 0–0.7)
Eosinophils Relative: 1 %
HCT: 35.4 % (ref 35.0–47.0)
Hemoglobin: 11.8 g/dL — ABNORMAL LOW (ref 12.0–16.0)
Lymphocytes Relative: 9 %
Lymphs Abs: 0.7 10*3/uL — ABNORMAL LOW (ref 1.0–3.6)
MCH: 31 pg (ref 26.0–34.0)
MCHC: 33.5 g/dL (ref 32.0–36.0)
MCV: 92.6 fL (ref 80.0–100.0)
Monocytes Absolute: 0.9 10*3/uL (ref 0.2–0.9)
Monocytes Relative: 11 %
Neutro Abs: 7 10*3/uL — ABNORMAL HIGH (ref 1.4–6.5)
Neutrophils Relative %: 79 %
Platelets: 35 10*3/uL — ABNORMAL LOW (ref 150–440)
RBC: 3.82 MIL/uL (ref 3.80–5.20)
RDW: 14.7 % — ABNORMAL HIGH (ref 11.5–14.5)
WBC: 8.8 10*3/uL (ref 3.6–11.0)

## 2015-07-01 MED ORDER — PHENAZOPYRIDINE HCL 100 MG PO TABS
100.0000 mg | ORAL_TABLET | Freq: Three times a day (TID) | ORAL | Status: DC
  Administered 2015-07-01 – 2015-07-02 (×3): 100 mg via ORAL
  Filled 2015-07-01 (×7): qty 1

## 2015-07-01 MED ORDER — CEFUROXIME AXETIL 250 MG PO TABS
500.0000 mg | ORAL_TABLET | Freq: Two times a day (BID) | ORAL | Status: DC
  Administered 2015-07-01 – 2015-07-02 (×3): 500 mg via ORAL
  Filled 2015-07-01 (×3): qty 2

## 2015-07-01 NOTE — Progress Notes (Addendum)
Pt c/o increased urgency.  Spoke with Dr. Doy Hutching, whom wants to get a urine sample.  If urine comes back clear he will order medication for overactive bladder.  Will get urine sample next time pt goes to bathroom.  Clarise Cruz, RN

## 2015-07-01 NOTE — Evaluation (Signed)
Physical Therapy Evaluation Patient Details Name: Ariana [name] MRN: [id number] DOB: Oct 28, 1932 Today's Date: 07/01/2015   History of Present Illness  Pt admitted to hospital after testing revealed that she had extremely low platelet levels. Pt has also been c/o of recent onset of generalized weakness and frequent falls.   Clinical Impression  Upon evaluation, pt states that she is feeling "not like herself" and that she just feels that her whole body is weaker than normal. Pt's communication seems to be impaired as she tends to mumble when speaking and appeared to be in some distress as she frequently would rub her face and head. Pt was able to demonstrate mod indep with bed mobility as she only used the bed rail for assistance with supine<>sit and scooting up toward HOB. Pt used the [facility] during evaluation and was able to perform a stand pivot transfer with min assist +1 and during clean up she was able to stand with no BUE support and min assist+1. Pt performed 5xSTS in 54 seconds, demonstrating decreases functional strength in her LEs and increased risk of falls. Pt was min assist +1 for all sit<>stand transfers and gait. She displayed gait deviations including decreased cadence, bilat decreased step length, and shuffling. Pt requires further skilled PT services in order to facilitate increases in functional mobility and LE strength in order to safely return pt to home environment. Current PT recommendation is for pt to be d/c to SNF in order to receive further treatment. Recommendation may change pending significant increases in function during hospitalization.     Follow Up Recommendations SNF    Equipment Recommendations  Rolling walker with 5" wheels    Recommendations for Other Services       Precautions / Restrictions Precautions Precautions: Fall Precaution Comments: due to generalized weakness  Restrictions Weight Bearing Restrictions: No      Mobility  Bed  Mobility Overal bed mobility: Modified Independent             General bed mobility comments: uses bed rail for assistance and takes longer time to perform mobility  Transfers Overall transfer level: Modified independent Equipment used: Rolling walker (2 wheeled);None (performed sit<>stand transfer with and without RW. )                Ambulation/Gait Ambulation/Gait assistance: Min guard Ambulation Distance (Feet): 25 Feet (pt ambulated from bed to just outside of her door and back) Assistive device: Rolling walker (2 wheeled) Gait Pattern/deviations: Decreased step length - right;Decreased step length - left;Decreased stride length;Shuffle;Narrow base of support (decreased cadance as well)   Gait velocity interpretation: <1.8 ft/sec, indicative of risk for recurrent falls    Stairs            Wheelchair Mobility    Modified Rankin (Stroke Patients Only)       Balance Overall balance assessment: Needs assistance Sitting-balance support: No upper extremity supported (able to sit on EOB without UE support) Sitting balance-Leahy Scale: Good     Standing balance support: Bilateral upper extremity supported;No upper extremity supported (pt requird BUE support on RW during ambulation, able to maintain static standing without UE support as displayed when she cleaned herself up after using restroom) Standing balance-Leahy Scale: Fair                   Standardized Balance Assessment Standardized Balance Assessment :  (5XSTS: 54 seconds)           Pertinent Vitals/Pain Pain Assessment: No/denies pain  Home Living Family/patient expects to be discharged to:: Private residence Living Arrangements: Spouse/significant other (lives with her husband in their mobile home) Available Help at Discharge: Family (son is available to help take care of her; he is on disability and does not work) Type of Home: Mobile home Home Access: Stairs to enter Entrance  Stairs-Rails: Right Entrance Stairs-Number of Steps: 3 Home Layout: One level Home Equipment: None Additional Comments: no grab bars in bathroom which is where her most frequent fall occurred.    Prior Function Level of Independence: Independent         Comments:  (pt was independent with ADLs )     Hand Dominance        Extremity/Trunk Assessment   Upper Extremity Assessment: Overall WFL for tasks assessed           Lower Extremity Assessment: Overall WFL for tasks assessed (Pt's strength grossly 3+/5  bilat determined through AROM and functional mobility. MMT deferred due to pt bruising easily and low platelet count.)         Communication   Communication:  (pt tended to mumble)  Cognition Arousal/Alertness: Lethargic Behavior During Therapy: Flat affect Overall Cognitive Status: Within Functional Limits for tasks assessed                      General Comments      Exercises Total Joint Exercises Ankle Circles/Pumps: AROM;5 reps;Seated;Both Long Arc Quad: AROM;5 reps;Both;Seated Marching in Standing: AROM;Both;5 reps;Seated (pt able to perform L with greater ease and ROM as compared to R) Other Exercises Other Exercises: sit<>stand x 5 using BUE for pushoff through bed and CGA +1, pt takes a considerably greater amount of time performing transfer as compared to age-related norms      Assessment/Plan    PT Assessment Patient needs continued PT services  PT Diagnosis Difficulty walking;Abnormality of gait;Generalized weakness   PT Problem List Decreased strength;Decreased activity tolerance;Decreased balance;Decreased range of motion;Decreased mobility  PT Treatment Interventions Gait training;Stair training;Functional mobility training;Therapeutic activities;Therapeutic exercise;Balance training;Patient/family education;DME instruction   PT Goals (Current goals can be found in the Care Plan section) Acute Rehab PT Goals Patient Stated Goal: to be  able to move around independently again PT Goal Formulation: With patient Time For Goal Achievement: 07/15/15 Potential to Achieve Goals: Fair Additional Goals Additional Goal #1: pt will decrease 5XSTS time to less than 30 seconds in order to demonstrate increased LE functional strength    Frequency Min 2X/week   Barriers to discharge  (pt will have to be able to safely negotiate 3 steps in order to facilitate return to home environment)      Co-evaluation               End of Session Equipment Utilized During Treatment: Gait belt Activity Tolerance: Patient limited by fatigue Patient left: in bed;with family/visitor present;with call bell/phone within reach;with bed alarm set Nurse Communication: Mobility status (nurse was notified that she should get pt up and walk a few times throughout the day. )         Time: 3086-5784 PT Time Calculation (min) (ACUTE ONLY): 23 min   Charges:         PT G Codes:        Basem Yannuzzi,SPT 07/01/2015, 11:01 AM

## 2015-07-01 NOTE — Care Management (Signed)
Admitted to [facility] with the diagnosis of thrombocytopenia. Lives with husband, Kyung Rudd, [phone]). Seen Dr, Doy Hutching 1-2 months ago. No home health. No skilled facility. No home oxygen. Uses no aids for ambulation. Takes care of all basic activities of daily living herself.  Appetite good up until hospitalization. Fell into bath tub Friday night. Physical therapy evaluation completed. Recommending skilled nursing facility. Family member at the bedside. Shelbie Ammons RN MSN Care Management [phone]

## 2015-07-01 NOTE — Plan of Care (Signed)
Problem: Discharge Progression Outcomes Goal: Other Discharge Outcomes/Goals Outcome: Progressing Plan of care progress to goals: Pain controlled- pt c/o back pain, improved with PRN Tramadol. Hemodynamically stable-VSS. Afebrile. Platelets last check-53, continue to monitor. No sign/symptom currently of active bleeding. Off unit telemetry- SB, hr 50's this shift.     Complications resolved-pt pulled PIV x's 2 this morning and required restart. Pt tolerated well. 20g right arm and 22g to left arm. Pt c/o insomnia, ambien given per order. Pt awoke confused and became impulsive after ambien. Will advise day RN to notify night shift, Ambien may have caused confusion this shift.   Tolerating diet-  No c/o nausea or vomiting this shift. Poor appetite- taking sips only.  Activity appropriated for discharge- High fall risk, bed alarm activated. Continue to monitor, pt very weak and unsteady while ambulating.Up to [facility] with 1 assist.

## 2015-07-01 NOTE — Care Management Important Message (Signed)
Important Message  Patient Details  Name: Ariana [name] MRN: [id number] Date of Birth: 06/21/1933   Medicare Important Message Given:  Yes-second notification given    Juliann Pulse A Allmond 07/01/2015, 10:33 AM

## 2015-07-01 NOTE — Plan of Care (Signed)
Problem: Discharge Progression Outcomes Goal: Other Discharge Outcomes/Goals Outcome: Progressing Plan of care progress to goal for: 1. Pain-pt c/o pain in her head, prn meds given with improvement 2. Hemodynamically-             -VSS, pt remains afebrile this shift 3. Complications-pt c/o nausea, prn meds given with improvement 4. Diet-pt tolerating diet this shift 5. Activity-pt up with 1 assist to the [facility]

## 2015-07-01 NOTE — Plan of Care (Signed)
Problem: Discharge Progression Outcomes Goal: Other Discharge Outcomes/Goals Outcome: Progressing Picked up patient at 1430 from Ariana Cruz, RN. Pt voiding very frequently. Notified MD of clear U/A results. Pyridium ordered TID.

## 2015-07-01 NOTE — Progress Notes (Signed)
Ariana [name] is a 81 y.o. female  <principal problem not specified>   SUBJECTIVE:  Pt in NAD. C/o generalized weakness and malaise. Afebrile. Cultures negative. Platelets down to 35K today despite IVIg.  ______________________________________________________________________  ROS: Review of systems is unremarkable for any active cardiac,respiratory, GI, GU, hematologic, neurologic or psychiatric systems, 10 systems reviewed.  @CMEDLIST @  Past Medical History  Diagnosis Date  . Hypercholesteremia   . ITP (idiopathic thrombocytopenic purpura)   . Restless legs   . Anemia   . Depression   . Osteoarthritis   . GERD (gastroesophageal reflux disease)   . Hiatal hernia   . Chronic back pain   . Asthma   . Osteoporosis   . Depression   . Anxiety   . Pulmonary emboli     Past Surgical History  Procedure Laterality Date  . Splenectomy, partial    . Total abdominal hysterectomy    . Lumbar disc surgery    . Carpal tunnel release      PHYSICAL EXAM:  BP 152/52 mmHg  Pulse 63  Temp(Src) 98.4 F (36.9 C) (Oral)  Resp 18  Ht 5\' 6"  (1.676 m)  Wt 63.64 kg (140 lb 4.8 oz)  BMI 22.66 kg/m2  SpO2 98%  Wt Readings from Last 3 Encounters:  06/29/15 63.64 kg (140 lb 4.8 oz)  04/12/15 67.05 kg (147 lb 13.1 oz)  02/28/15 66.2 kg (145 lb 15.1 oz)            Constitutional: NAD Neck: supple, no thyromegaly Respiratory: CTA, no rales or wheezes Cardiovascular: RRR, no murmur, no gallop Abdomen: soft, good BS, nontender Extremities: no edema Neuro: alert and oriented, no focal motor or sensory deficits  ASSESSMENT/PLAN:  Labs and imaging studies were reviewed  Will change to po ABX. PT, CSW, and CM consults today. May require SNF placement. Await f/u from Hematology regarding platelets. Repeat labs in AM.

## 2015-07-01 NOTE — Clinical Documentation Improvement (Signed)
Internal Medicine  Per MD documentation "possible sepsis" progress note 06/30/15. Please clarify if the following diagnosis, Sepsis was:   Present at the time of admission (POA)  NOT present at the time of admission and it developed during the inpatient stay  Unable to clinically determine whether the condition was present on admission.  Unknown   Supporting Information: Negative blood cultures...   Please exercise your independent, professional judgment when responding. A specific answer is not anticipated or expected.   Thank You,  Cameron [phone]

## 2015-07-02 DIAGNOSIS — Z79899 Other long term (current) drug therapy: Secondary | ICD-10-CM

## 2015-07-02 DIAGNOSIS — R2689 Other abnormalities of gait and mobility: Secondary | ICD-10-CM

## 2015-07-02 DIAGNOSIS — M810 Age-related osteoporosis without current pathological fracture: Secondary | ICD-10-CM

## 2015-07-02 DIAGNOSIS — E780 Pure hypercholesterolemia: Secondary | ICD-10-CM

## 2015-07-02 DIAGNOSIS — R233 Spontaneous ecchymoses: Secondary | ICD-10-CM

## 2015-07-02 DIAGNOSIS — D693 Immune thrombocytopenic purpura: Principal | ICD-10-CM

## 2015-07-02 DIAGNOSIS — Z9181 History of falling: Secondary | ICD-10-CM

## 2015-07-02 DIAGNOSIS — Z9081 Acquired absence of spleen: Secondary | ICD-10-CM

## 2015-07-02 DIAGNOSIS — R509 Fever, unspecified: Secondary | ICD-10-CM

## 2015-07-02 DIAGNOSIS — D696 Thrombocytopenia, unspecified: Secondary | ICD-10-CM

## 2015-07-02 DIAGNOSIS — M1990 Unspecified osteoarthritis, unspecified site: Secondary | ICD-10-CM

## 2015-07-02 LAB — CBC WITH DIFFERENTIAL/PLATELET
Basophils Absolute: 0 10*3/uL (ref 0–0.1)
Basophils Relative: 1 %
Eosinophils Absolute: 0.1 10*3/uL (ref 0–0.7)
Eosinophils Relative: 2 %
HCT: 39.5 % (ref 35.0–47.0)
Hemoglobin: 12.8 g/dL (ref 12.0–16.0)
Lymphocytes Relative: 18 %
Lymphs Abs: 1.4 10*3/uL (ref 1.0–3.6)
MCH: 30 pg (ref 26.0–34.0)
MCHC: 32.5 g/dL (ref 32.0–36.0)
MCV: 92.5 fL (ref 80.0–100.0)
Monocytes Absolute: 1 10*3/uL — ABNORMAL HIGH (ref 0.2–0.9)
Monocytes Relative: 13 %
Neutro Abs: 5.3 10*3/uL (ref 1.4–6.5)
Neutrophils Relative %: 66 %
Platelets: 63 10*3/uL — ABNORMAL LOW (ref 150–440)
RBC: 4.27 MIL/uL (ref 3.80–5.20)
RDW: 14.8 % — ABNORMAL HIGH (ref 11.5–14.5)
WBC: 7.9 10*3/uL (ref 3.6–11.0)

## 2015-07-02 LAB — BASIC METABOLIC PANEL
Anion gap: 10 (ref 5–15)
BUN: 12 mg/dL (ref 6–20)
CHLORIDE: 101 mmol/L (ref 101–111)
CO2: 26 mmol/L (ref 22–32)
CREATININE: 0.74 mg/dL (ref 0.44–1.00)
Calcium: 8.8 mg/dL — ABNORMAL LOW (ref 8.9–10.3)
GFR calc Af Amer: >60 mL/min (ref >60)
GFR calc non Af Amer: >60 mL/min (ref >60)
GLUCOSE: 80 mg/dL (ref 65–99)
Potassium: 4.1 mmol/L (ref 3.5–5.1)
SODIUM: 137 mmol/L (ref 135–145)

## 2015-07-02 LAB — URINE CULTURE: CULTURE: NO GROWTH

## 2015-07-02 MED ORDER — DOCUSATE SODIUM 100 MG PO CAPS: 100.0000 mg | ORAL_CAPSULE | Freq: Two times a day (BID) | ORAL | Status: DC | PRN

## 2015-07-02 MED ORDER — ACETAMINOPHEN 325 MG PO TABS: 650.0000 mg | ORAL_TABLET | Freq: Four times a day (QID) | ORAL | Status: AC | PRN

## 2015-07-02 MED ORDER — PHENAZOPYRIDINE HCL 100 MG PO TABS: 100.0000 mg | ORAL_TABLET | Freq: Three times a day (TID) | ORAL | Status: DC

## 2015-07-02 MED ORDER — SENNA 8.6 MG PO TABS: 1.0000 | ORAL_TABLET | Freq: Every day | ORAL | Status: DC | PRN

## 2015-07-02 MED ORDER — ONDANSETRON HCL 4 MG PO TABS: 4.0000 mg | ORAL_TABLET | Freq: Four times a day (QID) | ORAL | Status: DC | PRN

## 2015-07-02 MED ORDER — CEFUROXIME AXETIL 500 MG PO TABS: 500.0000 mg | ORAL_TABLET | Freq: Two times a day (BID) | ORAL | Status: DC

## 2015-07-02 NOTE — Progress Notes (Signed)
Patient discharged home with home health with family via wheelchair by volunteer services. Madlyn Frankel, RN

## 2015-07-02 NOTE — Clinical Social Work Note (Signed)
Clinical Social Work Assessment  Patient Details  Name: Ariana [name] MRN: [id number] Date of Birth: 1933/08/11  Date of referral:  07/02/15               Reason for consult:  Facility Placement                Permission sought to share information with:    Permission granted to share information::  Yes, Verbal Permission Granted  Name::     husband and son  Agency::     Relationship::     Contact Information:     Housing/Transportation Living arrangements for the past 2 months:  Single Family Home Source of Information:  Patient, Adult Children Patient Interpreter Needed:  None Criminal Activity/Legal Involvement Pertinent to Current Situation/Hospitalization:  No - Comment as needed Significant Relationships:  Spouse, Adult Children Lives with:  Spouse Do you feel safe going back to the place where you live?  Yes Need for family participation in patient care:  Yes (Comment)  Care giving concerns:  Pt continue to be weak.   Social Worker assessment / plan:  CSW met with pt and son at bedside to address consult for SNF.  Pt is a 81 y/o married female who currently lives in private residence with her husband.  Pt has one adult son.  CSW explained that PT recommends STR.  Pt refused STR and stated she feels that she would like to try to go home.  Pt is requesting home health services.  CSW explained that RNCM will coordinate home health services.  Pt stated she used home health services in the past following a back surgery.  CSW informed RNCM.  CSW signing off as there are no further needs at this time.  Employment status:  Retired Nurse, adult PT Recommendations:  [place] / Referral to community resources:     Patient/Family's Response to care:  Pt and son were pleasant and appreciative of CSW assistance.   Patient/Family's Understanding of and Emotional Response to Diagnosis, Current Treatment, and Prognosis:  Pt feels that she has enough support to recovery successfully at home.    Emotional Assessment Appearance:  Appears stated age Attitude/Demeanor/Rapport:  Other (Cooperative) Affect (typically observed):  Quiet, Guarded Orientation:  Oriented to Self, Oriented to Place, Oriented to  Time, Oriented to Situation Alcohol / Substance use:  Not Applicable Psych involvement (Current and /or in the community):  No (Comment)  Discharge Needs  Concerns to be addressed:  No discharge needs identified Readmission within the last 30 days:  No Current discharge risk:  None Barriers to Discharge:  No Barriers Identified   Naida Sleight, LCSW 07/02/2015, 10:31 AM

## 2015-07-02 NOTE — Progress Notes (Signed)
Lequita Asal, MD Physician Signed [phone] Consultation Progress Notes 06/30/2015 11:31 AM    Expand All Collapse All   [facility]  Date of admission: 06/29/2015  Inpatient day: 06/30/2015  Consulting physician: Dr. Gladstone Lighter   Reason for Consultation: Thrombocytopenia  Chief Complaint: Jaclynne Baldo is a 81 y.o. female with chronic immune mediated, cytopenic purpura (ITP) sees who was admitted through the emergency room with a fever and a platelet count of 4,000 following a fall.  HPI: The patient has a history of ITP past 10 years. She has had relatively refractory disease. She underwent splenectomy in 2007. Per review of records from Dr. Inez Pilgrim, she is refractory to prednisone, has no response to WinRho, and has no response to Rituxan. She has responded to vincristine in the past. She is also responded to IVIG, but with a rapid decline in her platelet count. She has been on N-plate since 46/96/2952. She has recently been followed by Dr. Ma Hillock (last seen 04/12/2015).  The patient her platelet count checked weekly. Review of her labs to 07/88/2016 revealed a fluctuating platelet count. As she has received N-plate 134-135 mcg (2 mck/kg) on 3 occasions (07/29, 08/25 and 09/15) during this interval. Platelet count was 37,000 on 06/27/2015. When she does not receive N-plate, her platelet count rapidly decreases.  The patient has had balance issues for the past week. She was sitting on the edge of her tub and fell backwards. She has chronic bruising which became worse yesterday following her fall. Yesterday afternoon, she felt cold and had chills. She denies any runny nose, sore throat, cough, or urinary symptoms. She has had 2 episodes of diarrhea today. Temperature was 102.9 last evening.  She was seen in the emergency room. Head CT was negative. Chest x-ray was negative. She received 2 platelet transfusions yesterday. One hour post platelet  count was 53,000. She notes bruising but no bleeding (epistaxis, gum bleeds, hematuria, melena or hematochezia).  Past Medical History  Diagnosis Date  . Hypercholesteremia   . ITP (idiopathic thrombocytopenic purpura)   . Restless legs   . Anemia   . Depression   . Osteoarthritis   . GERD (gastroesophageal reflux disease)   . Hiatal hernia   . Chronic back pain   . Asthma   . Osteoporosis   . Depression   . Anxiety   . Pulmonary emboli     Past Surgical History  Procedure Laterality Date  . Splenectomy, partial    . Total abdominal hysterectomy    . Lumbar disc surgery    . Carpal tunnel release      Family History  Problem Relation Age of Onset  . Stroke Mother     Social History:  reports that she has never smoked. She does not have any smokeless tobacco history on file. She reports that she does not drink alcohol or use illicit drugs. She is accompanied by her son and husband.  Allergies:  Allergies  Allergen Reactions  . Aspirin     Other reaction(s): Distress (finding)  . Diazepam     Other reaction(s): Itching of Skin sneezing  . Morphine     Other reaction(s): Itching of Skin  . Other     Other reaction(s): Unknown seasonal allergies  . Tetanus Toxoids     Other reaction(s): Localized superficial swelling of skin    Medications Prior to Admission  Medication Sig Dispense Refill  . albuterol (PROVENTIL HFA;VENTOLIN HFA) 108 (90 BASE) MCG/ACT inhaler Inhale 2  puffs into the lungs every 6 (six) hours as needed for wheezing or shortness of breath.    . BUDESONIDE-FORMOTEROL FUMARATE IN Inhale 2 puffs into the lungs 2 (two) times daily as needed (for shortness of breath and wheezing.).     Marland Kitchen Calcium Carbonate-Vitamin D (CALCIUM 600+D) 600-400 MG-UNIT per tablet Take 1 tablet by mouth daily.    . citalopram (CELEXA) 40 MG  tablet Take 40 mg by mouth daily.    . clotrimazole-betamethasone (LOTRISONE) cream Apply 1 application topically daily as needed (for rash.).     Marland Kitchen Cyanocobalamin (RA VITAMIN B-12 TR) 1000 MCG TBCR Take 1,000 mcg by mouth daily.     Marland Kitchen donepezil (ARICEPT) 5 MG tablet Take 5 mg by mouth at bedtime.     Marland Kitchen doxycycline (MONODOX) 100 MG capsule Take 100 mg by mouth 2 (two) times daily. X 10 days.    Marland Kitchen gabapentin (NEURONTIN) 300 MG capsule 342m orally 4 times a day    . HYDROcodone-acetaminophen (NORCO/VICODIN) 5-325 MG per tablet Take 1 tab by mouth every 4-6 hours as needed for pain    . HYDROcodone-acetaminophen (NORCO/VICODIN) 5-325 MG per tablet Take 1 tablet by mouth See admin instructions. Take 1 tabley by mouth every 4 to 6 hours as needed for pain.    .Marland Kitchenlevothyroxine (SYNTHROID, LEVOTHROID) 50 MCG tablet Take 50 mcg by mouth daily before breakfast. Take 30 to 60 minutes before breakfast.    . Multiple Vitamins-Minerals (OCUVITE-LUTEIN PO) Take 1 capsule by mouth daily.     .Marland Kitchenomeprazole (PRILOSEC) 20 MG capsule Take 20 mg by mouth 2 (two) times daily.    . pravastatin (PRAVACHOL) 20 MG tablet Take 20 mg by mouth daily.    . traMADol (ULTRAM) 50 MG tablet Take 50 mg by mouth 3 (three) times daily as needed for moderate pain or severe pain. pain    . venlafaxine XR (EFFEXOR-XR) 37.5 MG 24 hr capsule Take 1 capsule by mouth daily.     . vitamin E (E-400) 400 UNIT capsule Take 400 Units by mouth daily.    .Marland Kitchenzolpidem (AMBIEN) 5 MG tablet Take 5 mg by mouth at bedtime as needed for sleep.    .Marland Kitchenalbuterol (PROVENTIL HFA;VENTOLIN HFA) 108 (90 BASE) MCG/ACT inhaler Inhale 2 puffs into the lungs 4 (four) times daily as needed. As needed for shortness of breath      Review of Systems: GENERAL: Fatigue. Fever and chills yesterday. No weight loss. PERFORMANCE STATUS (ECOG): 2 HEENT: No visual changes, runny nose, sore  throat, mouth sores or tenderness. Lungs: No shortness of breath or cough. No hemoptysis. Cardiac: No chest pain, palpitations, orthopnea, or PND. GI: No nausea, vomiting, diarrhea, constipation, melena or hematochezia. GU: No urgency, frequency, dysuria, or hematuria. Musculoskeletal: No back pain. No joint pain. No muscle tenderness. Extremities: No pain or swelling. Skin: Multiple bruises. No rashes. Neuro: Forgetful. Bance issues x 1 week. No headache, numbness or weakness, or coordination issues. Endocrine: No diabetes, thyroid issues, hot flashes or night sweats. Psych: Episode of hallucinations at home. No mood changes, depression or anxiety. Pain: No focal pain. Review of systems: All other systems reviewed and found to be negative.  Physical Exam:  Blood pressure 98/70, pulse 67, temperature 99.3 F (37.4 C), temperature source Oral, resp. rate 16, height 5' 6" (1.676 m), weight 140 lb 4.8 oz (63.64 kg), SpO2 98 %.  GENERAL: Fatigued appearing woman sitting comfortably on the medical unit in no acute distress. MENTAL STATUS: Alert and  oriented to person, place and time. HEAD: Brown somewhat disheveled hair. Normocephalic, atraumatic, face symmetric, no Cushingoid features. EYES: Haxel eyes. Pupils equal round and reactive to light and accomodation. No conjunctivitis or scleral icterus. ENT: No mucosal bleeding. Oropharynx clear without lesion. Tongue normal. Mucous membranes moist.  RESPIRATORY: Clear to auscultation without rales, wheezes or rhonchi. CARDIOVASCULAR: Regular rate and rhythm without murmur, rub or gallop. ABDOMEN: Soft, non-tender, with active bowel sounds, and no hepatomegaly. S/p splenectomy. No masses. SKIN: Extensive ecchymosis upper and lower extremities. EXTREMITIES: No edema, no skin discoloration or tenderness. No palpable cords. LYMPH NODES: No palpable cervical, supraclavicular, axillary or inguinal adenopathy   NEUROLOGICAL: Unremarkable. PSYCH: Appropriate.   Lab Results Last 48 Hours    Results for orders placed or performed during the hospital encounter of 06/29/15 (from the past 48 hour(s))  Type and screen Status: None   Collection Time: 06/28/15 8:29 PM  Result Value Ref Range   ABO/RH(D) B POS    Antibody Screen NEG    Sample Expiration 07/01/2015   CBC with Differential Status: Abnormal   Collection Time: 06/29/15 2:52 PM  Result Value Ref Range   WBC 11.7 (H) 3.6 - 11.0 K/uL   RBC 4.43 3.80 - 5.20 MIL/uL   Hemoglobin 13.5 12.0 - 16.0 g/dL   HCT 41.5 35.0 - 47.0 %   MCV 93.6 80.0 - 100.0 fL   MCH 30.4 26.0 - 34.0 pg   MCHC 32.5 32.0 - 36.0 g/dL   RDW 14.9 (H) 11.5 - 14.5 %   Platelets 4 (LL) 150 - 400 K/uL    Comment: PLATELET COUNT CONFIRMED BY SMEAR CRITICAL RESULT CALLED TO, READ BACK BY AND VERIFIED WITH: BRANDY DAVIS,RN 06/29/2015 1610 BY JRS.    Neutrophils Relative % 90 %   Lymphocytes Relative 4 %   Monocytes Relative 6 %   Eosinophils Relative 0 %   Basophils Relative 0 %   Neutro Abs 10.5 (H) 1.4 - 6.5 K/uL   Lymphs Abs 0.5 (L) 1.0 - 3.6 K/uL   Monocytes Absolute 0.7 0.2 - 0.9 K/uL   Eosinophils Absolute 0.0 0 - 0.7 K/uL   Basophils Absolute 0.0 0 - 0.1 K/uL  Lactic acid, plasma Status: None   Collection Time: 06/29/15 2:52 PM  Result Value Ref Range   Lactic Acid, Venous 1.4 0.5 - 2.0 mmol/L  Urinalysis complete, with microscopic ([facility] only) Status: Abnormal   Collection Time: 06/29/15 2:52 PM  Result Value Ref Range   Color, Urine AMBER (A) YELLOW   APPearance TURBID (A) CLEAR   Glucose, UA NEGATIVE NEGATIVE mg/dL   Bilirubin Urine NEGATIVE NEGATIVE   Ketones, ur TRACE (A) NEGATIVE mg/dL   Specific Gravity, Urine 1.029 1.005 - 1.030   Hgb urine dipstick NEGATIVE NEGATIVE   pH 7.0  5.0 - 8.0   Protein, ur 30 (A) NEGATIVE mg/dL   Nitrite NEGATIVE NEGATIVE   Leukocytes, UA NEGATIVE NEGATIVE   RBC / HPF TOO NUMEROUS TO COUNT 0 - 5 RBC/hpf   WBC, UA NONE SEEN 0 - 5 WBC/hpf   Bacteria, UA FEW (A) NONE SEEN   Squamous Epithelial / LPF NONE SEEN NONE SEEN   Mucous PRESENT    Amorphous Crystal PRESENT   Comprehensive metabolic panel Status: Abnormal   Collection Time: 06/29/15 2:52 PM  Result Value Ref Range   Sodium 135 135 - 145 mmol/L   Potassium 3.9 3.5 - 5.1 mmol/L   Chloride 99 (L) 101 - 111 mmol/L   CO2  29 22 - 32 mmol/L   Glucose, Bld 68 65 - 99 mg/dL   BUN 19 6 - 20 mg/dL   Creatinine, Ser 0.92 0.44 - 1.00 mg/dL   Calcium 8.7 (L) 8.9 - 10.3 mg/dL   Total Protein 6.4 (L) 6.5 - 8.1 g/dL   Albumin 3.8 3.5 - 5.0 g/dL   AST 59 (H) 15 - 41 U/L   ALT 41 14 - 54 U/L   Alkaline Phosphatase 80 38 - 126 U/L   Total Bilirubin 1.0 0.3 - 1.2 mg/dL   GFR calc non Af Amer 58 (L) >60 mL/min   GFR calc Af Amer >60 >60 mL/min    Comment: (NOTE) The eGFR has been calculated using the CKD EPI equation. This calculation has not been validated in all clinical situations. eGFR's persistently <60 mL/min signify possible Chronic Kidney Disease.    Anion gap 7 5 - 15  Blood culture (routine x 2) Status: None (Preliminary result)   Collection Time: 06/29/15 2:52 PM  Result Value Ref Range   Specimen Description BLOOD LEFT WRIST    Special Requests BOTTLES DRAWN AEROBIC AND ANAEROBIC 1CC    Culture NO GROWTH < 24 HOURS    Report Status PENDING   Blood culture (routine x 2) Status: None (Preliminary result)   Collection Time: 06/29/15 3:41 PM  Result Value Ref Range   Specimen Description BLOOD LEFT ASSIST CONTROL    Special Requests BOTTLES DRAWN AEROBIC AND ANAEROBIC 1CC    Culture NO GROWTH < 24 HOURS     Report Status PENDING   ABO/Rh Status: None   Collection Time: 06/29/15 8:10 PM  Result Value Ref Range   ABO/RH(D) B POS   Prepare Pheresed Platelets Status: None (Preliminary result)   Collection Time: 06/29/15 8:30 PM  Result Value Ref Range   Unit Number [id number]    Blood Component Type PLTPHER LR2    Unit division 00    Status of Unit ISSUED    Transfusion Status OK TO TRANSFUSE    Unit Number [id number]    Blood Component Type PLTPHER LR1    Unit division 00    Status of Unit REL FROM [facility]    Transfusion Status OK TO TRANSFUSE   Basic metabolic panel Status: Abnormal   Collection Time: 06/30/15 3:37 AM  Result Value Ref Range   Sodium 131 (L) 135 - 145 mmol/L   Potassium 3.8 3.5 - 5.1 mmol/L   Chloride 102 101 - 111 mmol/L   CO2 25 22 - 32 mmol/L   Glucose, Bld 107 (H) 65 - 99 mg/dL   BUN 14 6 - 20 mg/dL   Creatinine, Ser 0.83 0.44 - 1.00 mg/dL   Calcium 7.9 (L) 8.9 - 10.3 mg/dL   GFR calc non Af Amer >60 >60 mL/min   GFR calc Af Amer >60 >60 mL/min    Comment: (NOTE) The eGFR has been calculated using the CKD EPI equation. This calculation has not been validated in all clinical situations. eGFR's persistently <60 mL/min signify possible Chronic Kidney Disease.    Anion gap 4 (L) 5 - 15  Magnesium Status: None   Collection Time: 06/30/15 3:37 AM  Result Value Ref Range   Magnesium 2.2 1.7 - 2.4 mg/dL  CBC Status: Abnormal   Collection Time: 06/30/15 5:28 AM  Result Value Ref Range   WBC 11.3 (H) 3.6 - 11.0 K/uL   RBC 3.93 3.80 - 5.20 MIL/uL   Hemoglobin 12.1 12.0 - 16.0 g/dL  HCT 36.6 35.0 - 47.0 %   MCV 93.0 80.0 - 100.0 fL   MCH 30.7 26.0 - 34.0 pg   MCHC 33.1 32.0 - 36.0 g/dL   RDW 14.8 (H) 11.5 - 14.5 %   Platelets 53 (L) 150 - 440 K/uL    Comment:  PLATELET COUNT CONFIRMED BY SMEAR      Imaging Results (Last 48 hours)    Ct Head Wo Contrast  06/29/2015 CLINICAL DATA: Altered mental status, fall EXAM: CT HEAD WITHOUT CONTRAST TECHNIQUE: Contiguous axial images were obtained from the base of the skull through the vertex without intravenous contrast. COMPARISON: 02/23/2014 FINDINGS: No skull fracture is noted. Paranasal sinuses and mastoid air cells are unremarkable. No intracranial hemorrhage, mass effect or midline shift. No acute infarction. No mass lesion is noted on this unenhanced scan. Stable mild cerebral atrophy. Stable mild periventricular chronic white matter disease. Ventricular size is stable from prior exam. IMPRESSION: No acute intracranial abnormality. Stable atrophy and chronic white matter disease. Electronically Signed By: Lahoma Crocker M.D. On: 06/29/2015 15:38   Dg Chest Portable 1 View  06/29/2015 CLINICAL DATA: Fall, altered mental status EXAM: PORTABLE CHEST - 1 VIEW COMPARISON: 12/23/2014 FINDINGS: Cardiomediastinal silhouette is stable. No acute infiltrate or pleural effusion. No pulmonary edema. Stable old right rib fracture. No pneumothorax. IMPRESSION: No active disease. Electronically Signed By: Lahoma Crocker M.D. On: 06/29/2015 15:42     Assessment: The patient is a 81 y.o. woman with chronic immune mediated thrombocytopenic purpura (ITP) who was admitted with a platelet count of 4, 000 and a fever.   Patient received 2 doses of IVIG IV.  Platelet count is improved to 63,000. No evidence of bleeding. On admission patient had a high fever probably viral infection or bacterial infection brought the platelet count down I will discuss with Dr. Doy Hutching If there is no evidence of positive blood culture urine culture patient probably available to go home and will follow her as outpatient for continuation of management of thrombocytopenia. Thanks for         letting us participate in care of  the patient

## 2015-07-02 NOTE — Progress Notes (Signed)
Discharge instructions given and went over with patient and family at bedside. All questions answered. Patient to be discharged home with home health. Awaiting transportation to be provided by family. Madlyn Frankel, RN

## 2015-07-02 NOTE — Care Management (Signed)
Ambulated much better with physical therapy today. Recommending home with home health/physical therapy. Discussed home health agencies at the bedside. [place]. Will update [employer] representative for [facility], Shelbie Ammons RN MSN Care Management [phone]

## 2015-07-02 NOTE — Progress Notes (Signed)
Physical Therapy Treatment Patient Details Name: Ariana [name] MRN: [id number] DOB: 1933-01-19 Today's Date: 07/02/2015    History of Present Illness Pt admitted to hospital after testing revealed that she had extremely low platelet levels. Pt has also been c/o of recent onset of generalized weakness and frequent falls.     PT Comments    Pt states that she is feeling much better today and that she feels like her legs are stronger. States that she was able to get up with nursing staff yesterday after he evaluation and walk out into the hallway ~75 feet. Pt did well with all functional mobility tasks today and showed significant progress in her capacity for movement. Pt ambulated ~250 feet around nursing station with RW and PT supervision. Pt also ascended/descended 5 stairs using one handrail on R side for support and CGA. Pt did not display any instances of unsteadiness during the treatment session today. Based on the pt's performance during today's treatment session we are changing her recommended d/c destination to home with HHPT.   Follow Up Recommendations  Home health PT     Equipment Recommendations  Rolling walker with 5" wheels    Recommendations for Other Services       Precautions / Restrictions Precautions Precautions: Fall Precaution Comments: due to generalized weakness  Restrictions Weight Bearing Restrictions: No    Mobility  Bed Mobility Overal bed mobility: Modified Independent             General bed mobility comments: uses bed rail for assistance, was efficient with her supine <>sit mobility  Transfers Overall transfer level: Modified independent Equipment used: Rolling walker (2 wheeled)             General transfer comment: pt performed sit <>stand with supervision  Ambulation/Gait Ambulation/Gait assistance: Supervision Ambulation Distance (Feet): 250 Feet Assistive device: Rolling walker (2 wheeled) Gait Pattern/deviations:   (pt demonstrated gait near functional limits but with decreased cadence)   Gait velocity interpretation: <1.8 ft/sec, indicative of risk for recurrent falls     Stairs Stairs: Yes Stairs assistance: Min guard Stair Management: One rail Right Number of Stairs: 5 General stair comments: pt did well with stair negotiation using railing on R side. Pt did not demonstrate any unsteadiness or demonstrate risk of falling when performing stairs today.   Wheelchair Mobility    Modified Rankin (Stroke Patients Only)       Balance Overall balance assessment: Modified Independent Sitting-balance support: No upper extremity supported;Feet supported Sitting balance-Leahy Scale: Good     Standing balance support: Bilateral upper extremity supported (pt maintained BUE on RW throughout ambulation and was able to statically maintain balance in standing without use of UEs ) Standing balance-Leahy Scale: Good                      Cognition Arousal/Alertness: Awake/alert Behavior During Therapy: WFL for tasks assessed/performed Overall Cognitive Status: Within Functional Limits for tasks assessed                      Exercises Other Exercises Other Exercises: gait x 250 ft with RW and supervision     General Comments        Pertinent Vitals/Pain Pain Assessment: No/denies pain    Home Living                      Prior Function  PT Goals (current goals can now be found in the care plan section) Progress towards PT goals: Progressing toward goals    Frequency  Min 2X/week    PT Plan Discharge plan needs to be updated    Co-evaluation             End of Session Equipment Utilized During Treatment: Gait belt Activity Tolerance: Patient tolerated treatment well Patient left: in bed;with bed alarm set;with family/visitor present     Time: 7341-9379 PT Time Calculation (min) (ACUTE ONLY): 23 min  Charges:                       G  CodesMilon Score 07/02/2015, 11:15 AM

## 2015-07-02 NOTE — Progress Notes (Signed)
Ariana [name] is a 81 y.o. female  <principal problem not specified>   SUBJECTIVE:  Pt feeling stronger this AM. PT eval yesterday recommended SNF. Platelets form this AM pending. Was not seen by CSW or Hematology yesterday. Remains afebrile.  ______________________________________________________________________  ROS: Review of systems is unremarkable for any active cardiac,respiratory, GI, GU, hematologic, neurologic or psychiatric systems, 10 systems reviewed.  @CMEDLIST @  Past Medical History  Diagnosis Date  . Hypercholesteremia   . ITP (idiopathic thrombocytopenic purpura)   . Restless legs   . Anemia   . Depression   . Osteoarthritis   . GERD (gastroesophageal reflux disease)   . Hiatal hernia   . Chronic back pain   . Asthma   . Osteoporosis   . Depression   . Anxiety   . Pulmonary emboli     Past Surgical History  Procedure Laterality Date  . Splenectomy, partial    . Total abdominal hysterectomy    . Lumbar disc surgery    . Carpal tunnel release      PHYSICAL EXAM:  BP 148/64 mmHg  Pulse 57  Temp(Src) 98.4 F (36.9 C) (Oral)  Resp 18  Ht 5\' 6"  (1.676 m)  Wt 63.64 kg (140 lb 4.8 oz)  BMI 22.66 kg/m2  SpO2 98%  Wt Readings from Last 3 Encounters:  06/29/15 63.64 kg (140 lb 4.8 oz)  04/12/15 67.05 kg (147 lb 13.1 oz)  02/28/15 66.2 kg (145 lb 15.1 oz)            Constitutional: NAD Neck: supple, no thyromegaly Respiratory: CTA, no rales or wheezes Cardiovascular: RRR, no murmur, no gallop Abdomen: soft, good BS, nontender Extremities: no edema Neuro: alert and oriented, no focal motor or sensory deficits  ASSESSMENT/PLAN:  Labs and imaging studies were reviewed  Awaiting labs from this AM. Will ask Hematology, PT, and CSW to evaluate today. Continue daily CBC until D/C.

## 2015-07-02 NOTE — Plan of Care (Signed)
Problem: Discharge Progression Outcomes Goal: Other Discharge Outcomes/Goals Outcome: Not Progressing Pt is alert and oriented, no c/o pain at this time. 1 assist to the [facility]. VSS, continue to monitor.

## 2015-07-02 NOTE — Discharge Summary (Signed)
Ariana [name], is a 81 y.o. female  DOB 1933-01-02  MRN [id number].  Admission date:  06/29/2015  Admitting Physician  Gladstone Lighter, MD  Discharge Date:  07/02/2015   Primary MD  SPARKS,JEFFREY D, MD  Recommendations for primary care physician for things to follow:     Admission Diagnosis  Thrombocytopenic [D69.6]   Discharge Diagnosis  Thrombocytopenic [D69.6]  CAP  Active Problems:   Thrombocytopenia      Past Medical History  Diagnosis Date  . Hypercholesteremia   . ITP (idiopathic thrombocytopenic purpura)   . Restless legs   . Anemia   . Depression   . Osteoarthritis   . GERD (gastroesophageal reflux disease)   . Hiatal hernia   . Chronic back pain   . Asthma   . Osteoporosis   . Depression   . Anxiety   . Pulmonary emboli     Past Surgical History  Procedure Laterality Date  . Splenectomy, partial    . Total abdominal hysterectomy    . Lumbar disc surgery    . Carpal tunnel release         History of present illness and  Hospital Course:     Kindly see H&P for history of present illness and admission details, please review complete Labs, Consult reports and Test reports for all details in brief  HPI  from the history and physical done on the day of admission    Hospital Course    Pt admitted with pneumonia ande thrombocytopenia. Blood cultures negative. Fever resolved. Stable on RA. Switched to po ABX. Given IVIg for low platelets with improvement of counts. Seen by Hematology.   Discharge Condition: stable   Follow UP  Follow-up Information    Follow up In 3 days.      Follow up with SPARKS,JEFFREY D, MD.   Specialty:  Internal Medicine   Contact information:   [place] [id number] [phone]         Discharge Instructions  and  Discharge Medications       Medication List    STOP taking these  medications        doxycycline 100 MG capsule  Commonly known as:  MONODOX      TAKE these medications        acetaminophen 325 MG tablet  Commonly known as:  TYLENOL  Take 2 tablets (650 mg total) by mouth every 6 (six) hours as needed for mild pain (or Fever >/= 101).     albuterol 108 (90 BASE) MCG/ACT inhaler  Commonly known as:  PROVENTIL HFA;VENTOLIN HFA  Inhale 2 puffs into the lungs every 6 (six) hours as needed for wheezing or shortness of breath.     BUDESONIDE-FORMOTEROL FUMARATE IN  Inhale 2 puffs into the lungs 2 (two) times daily as needed (for shortness of breath and wheezing.).     CALCIUM 600+D 600-400 MG-UNIT per tablet  Generic drug:  Calcium Carbonate-Vitamin D  Take 1 tablet by mouth daily.  cefUROXime 500 MG tablet  Commonly known as:  CEFTIN  Take 1 tablet (500 mg total) by mouth 2 (two) times daily with a meal.     citalopram 40 MG tablet  Commonly known as:  CELEXA  Take 40 mg by mouth daily.     clotrimazole-betamethasone cream  Commonly known as:  LOTRISONE  Apply 1 application topically daily as needed (for rash.).     docusate sodium 100 MG capsule  Commonly known as:  COLACE  Take 1 capsule (100 mg total) by mouth 2 (two) times daily as needed for mild constipation.     donepezil 5 MG tablet  Commonly known as:  ARICEPT  Take 5 mg by mouth at bedtime.     E-400 400 UNIT capsule  Generic drug:  vitamin E  Take 400 Units by mouth daily.     gabapentin 300 MG capsule  Commonly known as:  NEURONTIN  300mg  orally 4 times a day     HYDROcodone-acetaminophen 5-325 MG per tablet  Commonly known as:  NORCO/VICODIN  Take 1 tab by mouth every 4-6 hours as needed for pain     levothyroxine 50 MCG tablet  Commonly known as:  SYNTHROID, LEVOTHROID  Take 50 mcg by mouth daily before breakfast. Take 30 to 60 minutes before breakfast.     OCUVITE-LUTEIN PO  Take 1 capsule by mouth daily.     omeprazole 20 MG capsule  Commonly known as:   PRILOSEC  Take 20 mg by mouth 2 (two) times daily.     ondansetron 4 MG tablet  Commonly known as:  ZOFRAN  Take 1 tablet (4 mg total) by mouth every 6 (six) hours as needed for nausea.     phenazopyridine 100 MG tablet  Commonly known as:  PYRIDIUM  Take 1 tablet (100 mg total) by mouth 3 (three) times daily with meals.     pravastatin 20 MG tablet  Commonly known as:  PRAVACHOL  Take 20 mg by mouth daily.     RA VITAMIN B-12 TR 1000 MCG Tbcr  Generic drug:  Cyanocobalamin  Take 1,000 mcg by mouth daily.     senna 8.6 MG Tabs tablet  Commonly known as:  SENOKOT  Take 1 tablet (8.6 mg total) by mouth daily as needed for mild constipation.     traMADol 50 MG tablet  Commonly known as:  ULTRAM  Take 50 mg by mouth 3 (three) times daily as needed for moderate pain or severe pain. pain     venlafaxine XR 37.5 MG 24 hr capsule  Commonly known as:  EFFEXOR-XR  Take 1 capsule by mouth daily.     zolpidem 5 MG tablet  Commonly known as:  AMBIEN  Take 5 mg by mouth at bedtime as needed for sleep.          Diet and Activity recommendation: See Discharge Instructions above   Consults obtained - Hematology, PT, CSW   Major procedures and Radiology Reports - PLEASE review detailed and final reports for all details, in brief -   See below   Ct Head Wo Contrast  06/29/2015   CLINICAL DATA:  Altered mental status, fall  EXAM: CT HEAD WITHOUT CONTRAST  TECHNIQUE: Contiguous axial images were obtained from the base of the skull through the vertex without intravenous contrast.  COMPARISON:  02/23/2014  FINDINGS: No skull fracture is noted. Paranasal sinuses and mastoid air cells are unremarkable. No intracranial hemorrhage, mass effect or midline shift. No acute  infarction. No mass lesion is noted on this unenhanced scan. Stable mild cerebral atrophy. Stable mild periventricular chronic white matter disease. Ventricular size is stable from prior exam.  IMPRESSION: No acute  intracranial abnormality. Stable atrophy and chronic white matter disease.   Electronically Signed   By: Lahoma Crocker M.D.   On: 06/29/2015 15:38   Dg Chest Portable 1 View  06/29/2015   CLINICAL DATA:  Fall, altered mental status  EXAM: PORTABLE CHEST - 1 VIEW  COMPARISON:  12/23/2014  FINDINGS: Cardiomediastinal silhouette is stable. No acute infiltrate or pleural effusion. No pulmonary edema. Stable old right rib fracture. No pneumothorax.  IMPRESSION: No active disease.   Electronically Signed   By: Lahoma Crocker M.D.   On: 06/29/2015 15:42    Micro Results   See below  Recent Results (from the past 240 hour(s))  Blood culture (routine x 2)     Status: None (Preliminary result)   Collection Time: 06/29/15  2:52 PM  Result Value Ref Range Status   Specimen Description BLOOD LEFT WRIST  Final   Special Requests BOTTLES DRAWN AEROBIC AND ANAEROBIC  1CC  Final   Culture NO GROWTH 3 DAYS  Final   Report Status PENDING  Incomplete  Urine culture     Status: None (Preliminary result)   Collection Time: 06/29/15  2:52 PM  Result Value Ref Range Status   Specimen Description URINE, RANDOM  Final   Special Requests Ceftriaxone  Final   Culture NO GROWTH < 24 HOURS  Final   Report Status PENDING  Incomplete  Blood culture (routine x 2)     Status: None (Preliminary result)   Collection Time: 06/29/15  3:41 PM  Result Value Ref Range Status   Specimen Description BLOOD LEFT ASSIST CONTROL  Final   Special Requests BOTTLES DRAWN AEROBIC AND ANAEROBIC  1CC  Final   Culture NO GROWTH 3 DAYS  Final   Report Status PENDING  Incomplete       Today   Subjective:   Ariana [name] today has no headache,no chest abdominal pain,no new weakness tingling or numbness, feels much better and is ready for D/C today  Objective:   Blood pressure 148/64, pulse 57, temperature 98.4 F (36.9 C), temperature source Oral, resp. rate 18, height 5\' 6"  (1.676 m), weight 63.64 kg (140 lb 4.8 oz), SpO2 98  %.   Intake/Output Summary (Last 24 hours) at 07/02/15 0727 Last data filed at 07/02/15 0606  Gross per 24 hour  Intake    240 ml  Output    775 ml  Net   -535 ml    Exam Awake Alert, Oriented x 3, No new F.N deficits, Normal affect [place].AT,PERRAL Supple Neck,No JVD, No cervical lymphadenopathy appriciated.  Symmetrical Chest wall movement, Good air movement bilaterally, CTAB RRR,No Gallops,Rubs or new Murmurs, No Parasternal Heave +ve B.Sounds, Abd Soft, Non tender, No organomegaly appriciated, No rebound -guarding or rigidity. No Cyanosis, Clubbing or edema, No new Rash or bruise  Data Review   CBC w Diff: Lab Results  Component Value Date   WBC 7.9 07/02/2015   WBC 9.8 01/18/2015   HGB 12.8 07/02/2015   HGB 14.2 01/18/2015   HCT 39.5 07/02/2015   HCT 43.0 01/18/2015   PLT 63* 07/02/2015   PLT 97* 02/04/2015   LYMPHOPCT 18% 07/02/2015   LYMPHOPCT 23.4 01/18/2015   MONOPCT 13% 07/02/2015   MONOPCT 7.2 01/18/2015   EOSPCT 2% 07/02/2015   EOSPCT 1.5 01/18/2015  BASOPCT 1% 07/02/2015   BASOPCT 1.1 01/18/2015    CMP: Lab Results  Component Value Date   NA 137 07/02/2015   NA 143 06/13/2014   K 4.1 07/02/2015   K 4.2 06/13/2014   CL 101 07/02/2015   CL 105 06/13/2014   CO2 26 07/02/2015   CO2 31 06/13/2014   BUN 12 07/02/2015   BUN 15 06/13/2014   CREATININE 0.74 07/02/2015   CREATININE 1.05 06/13/2014   PROT 6.4* 06/29/2015   PROT 6.5 06/01/2014   ALBUMIN 3.8 06/29/2015   ALBUMIN 3.4 06/01/2014   BILITOT 1.0 06/29/2015   BILITOT 0.3 06/01/2014   ALKPHOS 80 06/29/2015   ALKPHOS 93 06/01/2014   AST 59* 06/29/2015   AST 31 06/01/2014   ALT 41 06/29/2015   ALT 76* 06/01/2014  .   Total Time in preparing paper work, data evaluation and todays exam - 41 minutes  SPARKS,JEFFREY D M.D on 07/02/2015 at 7:27 AM

## 2015-07-03 ENCOUNTER — Other Ambulatory Visit: Payer: Self-pay | Admitting: Internal Medicine

## 2015-07-03 DIAGNOSIS — F419 Anxiety disorder, unspecified: Secondary | ICD-10-CM | POA: Diagnosis not present

## 2015-07-03 DIAGNOSIS — K219 Gastro-esophageal reflux disease without esophagitis: Secondary | ICD-10-CM | POA: Diagnosis not present

## 2015-07-03 DIAGNOSIS — M810 Age-related osteoporosis without current pathological fracture: Secondary | ICD-10-CM | POA: Diagnosis not present

## 2015-07-03 DIAGNOSIS — D649 Anemia, unspecified: Secondary | ICD-10-CM | POA: Diagnosis not present

## 2015-07-03 DIAGNOSIS — F0390 Unspecified dementia without behavioral disturbance: Secondary | ICD-10-CM | POA: Diagnosis not present

## 2015-07-03 DIAGNOSIS — D693 Immune thrombocytopenic purpura: Secondary | ICD-10-CM | POA: Diagnosis not present

## 2015-07-03 DIAGNOSIS — F329 Major depressive disorder, single episode, unspecified: Secondary | ICD-10-CM | POA: Diagnosis not present

## 2015-07-03 DIAGNOSIS — J45909 Unspecified asthma, uncomplicated: Secondary | ICD-10-CM | POA: Diagnosis not present

## 2015-07-03 DIAGNOSIS — M150 Primary generalized (osteo)arthritis: Secondary | ICD-10-CM | POA: Diagnosis not present

## 2015-07-04 ENCOUNTER — Inpatient Hospital Stay: Payer: Commercial Managed Care - HMO

## 2015-07-04 ENCOUNTER — Inpatient Hospital Stay: Payer: Commercial Managed Care - HMO | Admitting: Internal Medicine

## 2015-07-04 ENCOUNTER — Inpatient Hospital Stay (HOSPITAL_BASED_OUTPATIENT_CLINIC_OR_DEPARTMENT_OTHER): Payer: Commercial Managed Care - HMO | Admitting: Internal Medicine

## 2015-07-04 ENCOUNTER — Encounter: Payer: Self-pay | Admitting: Internal Medicine

## 2015-07-04 VITALS — BP 124/83 | HR 66 | Temp 98.0°F | Resp 18 | Ht 66.0 in | Wt 140.0 lb

## 2015-07-04 DIAGNOSIS — R5383 Other fatigue: Secondary | ICD-10-CM

## 2015-07-04 DIAGNOSIS — D693 Immune thrombocytopenic purpura: Secondary | ICD-10-CM

## 2015-07-04 DIAGNOSIS — Z9081 Acquired absence of spleen: Secondary | ICD-10-CM | POA: Diagnosis not present

## 2015-07-04 DIAGNOSIS — Z23 Encounter for immunization: Secondary | ICD-10-CM | POA: Diagnosis not present

## 2015-07-04 DIAGNOSIS — Z79899 Other long term (current) drug therapy: Secondary | ICD-10-CM

## 2015-07-04 LAB — CULTURE, BLOOD (ROUTINE X 2)
CULTURE: NO GROWTH
CULTURE: NO GROWTH

## 2015-07-04 MED ORDER — INFLUENZA VAC SPLIT QUAD 0.5 ML IM SUSY
0.5000 mL | PREFILLED_SYRINGE | Freq: Once | INTRAMUSCULAR | Status: AC
  Administered 2015-07-04: 0.5 mL via INTRAMUSCULAR
  Filled 2015-07-04: qty 0.5

## 2015-07-04 NOTE — Patient Instructions (Signed)
Eltrombopag tablets What is this medicine? ELTROMBOPAG (el TROM boe pag) helps your body make more platelets. It is used to treat low platelets caused by chronic immune (idiopathic) thrombocytopenic purpura (ITP) or chronic hepatitis C infection. It is also used in patients with severe aplastic anemia when other medicines have not worked well enough. This medicine may be used for other purposes; ask your health care provider or pharmacist if you have questions. COMMON BRAND NAME(S): Promacta  What should I tell my health care provider before I take this medicine? They need to know if you have any of these conditions: -history of blood clots -eye disease, vision problems -kidney disease -liver disease -low blood counts, like low white cell, platelet, or red cell counts -have had your spleen removed -an unusual or allergic reaction to Eltrombopag, other medicines, foods, dyes, or preservatives -pregnant or trying to get pregnant -breast-feeding  How should I use this medicine? Take this medicine by mouth with a glass of water. Follow the directions on the prescription label. Take this medicine on an empty stomach, at least 1 hour before or 2 hours after food. Do not take with food. Avoid antacids, aluminum, calcium, iron, magnesium, selenium, and zinc products for 4 hours before and 4 hours after taking your dose. Take your medicine at regular intervals. Do not take it more often than directed. Do not stop taking except on your doctor's advice.  Please take all other medications, including vitamins and oral juices 2 hours before this tablet or 4 hours after this medications.    A special MedGuide will be given to you by the pharmacist with each prescription and refill. Be sure to read this information carefully each time. Talk to your pediatrician regarding the use of this medicine in children. Special care may be needed. Overdosage: If you think you have taken too much of this medicine  contact a poison control center or emergency room at once. NOTE: This medicine is only for you. Do not share this medicine with others.  What if I miss a dose? If you miss a dose, wait and take your next scheduled dose. Do not take more than 1 dose in 1 day. If it is almost time for your next dose, take only that dose. Do not take double or extra doses.   What may interact with this medicine? -acetaminophen -antacids -benzylpenicillin -calcium supplements -certain medicines for cholesterol like atorvastatin, fluvastatin, pravastatin, rosuvastatin -ciprofloxacin -fluvoxamine -gemfibrozil -iron supplements -magnesium supplements -methotrexate -multivitamins with minerals -nateglinide -narcotic medicines for pain -NSAIDS, medicines for pain and inflammation, like ibuprofen or naproxen -omeprazole -repaglinide -rifampin -selenium -tobacco -trimethoprim -zinc This list may not describe all possible interactions. Give your health care provider a list of all the medicines, herbs, non-prescription drugs, or dietary supplements you use. Also tell them if you smoke, drink alcohol, or use illegal drugs. Some items may interact with your medicine.  What should I watch for while using this medicine? Your condition will be monitored carefully while you are receiving this medicine. To receive this medicine, you, your doctor and your pharmacy must be registered in the [facility] program. Visit your prescriber or health care professional for regular checks on your progress and for the needed blood tests. It is important to keep all appointments.  What side effects may I notice from receiving this medicine? Side effects that you should report to your doctor or health care professional as soon as possible: -allergic reactions like skin rash, itching or hives, swelling of  the face, lips, or tongue -changes in vision -dark urine -general ill feeling or flu-like symptoms -light-colored  stools -loss of appetite, nausea -right upper belly pain -shortness of breath, chest pain, swelling in a leg -unusual bleeding or bruising -unusually weak or tired -yellowing of the eyes or skin Side effects that usually do not require medical attention (report to your doctor or health care professional if they continue or are bothersome): -constipation -dry mouth -headache -muscle aches This list may not describe all possible side effects. Call your doctor for medical advice about side effects. You may report side effects to FDA at 1-800-FDA-1088.  Where should I keep my medicine? Keep out of the reach of children. Store at room temperature between 15 and 30 degrees C (59 and 86 degrees F). Throw away any unused medicine after the expiration date. NOTE: This sheet is a summary. It may not cover all possible information. If you have questions about this medicine, talk to your doctor, pharmacist, or health care provider.  2015, Elsevier/Gold Standard. (2013-06-07 15:29:25)

## 2015-07-04 NOTE — Progress Notes (Signed)
[facility] OFFICE PROGRESS NOTE  Patient Care Team: Idelle Crouch, MD as PCP - General (Internal Medicine)  HPI  SUMMARY of HEMATOLOGIC HISTORY:  #  ITP s/p Splenectomy [2007]; POOR RESPONSE to Prednisone; WinRho; Rituxan; RESPONSIVE to IVIG  # OCT 2015- START N-PLATE- LABILE RESPONSE  # SEP END 2016- START PROMACTA 50mg /d   INTERVAL HISTORY:   A 81 year old female patient with above history of chronic refractory ITP currently on N-plate is here for follow-up. In the interim patient was recently admitted to the hospital with fever/also low platelets of 4000. Patient received IVIG; platelet transfusion and then discharge. At the time of discharge platelets were 63,000.  Patient still feels fatigued; status she has no energy. Otherwise no fever no chills. No nausea no vomiting. Denies any bleeding gums or nose. She continues to have easy bruising.   REVIEW OF SYSTEMS:  10 point review of systems was done with the patient and are negative.  I have reviewed the past medical history, past surgical history, social history and family history with the patient and they are unchanged from previous note unless stated above.  ALLERGIES:  is allergic to aspirin; diazepam; morphine; other; and tetanus toxoids.  MEDICATIONS:  Current Outpatient Prescriptions  Medication Sig Dispense Refill  . acetaminophen (TYLENOL) 325 MG tablet Take 2 tablets (650 mg total) by mouth every 6 (six) hours as needed for mild pain (or Fever >/= 101). 100 tablet 0  . albuterol (PROVENTIL HFA;VENTOLIN HFA) 108 (90 BASE) MCG/ACT inhaler Inhale 2 puffs into the lungs every 6 (six) hours as needed for wheezing or shortness of breath.    . BUDESONIDE-FORMOTEROL FUMARATE IN Inhale 2 puffs into the lungs 2 (two) times daily as needed (for shortness of breath and wheezing.).     Marland Kitchen Calcium Carbonate-Vitamin D (CALCIUM 600+D) 600-400 MG-UNIT per tablet Take 1 tablet by mouth daily.    . cefUROXime (CEFTIN)  500 MG tablet Take 1 tablet (500 mg total) by mouth 2 (two) times daily with a meal. 20 tablet 0  . citalopram (CELEXA) 40 MG tablet Take 40 mg by mouth daily.    . clotrimazole-betamethasone (LOTRISONE) cream Apply 1 application topically daily as needed (for rash.).     Marland Kitchen Cyanocobalamin (RA VITAMIN B-12 TR) 1000 MCG TBCR Take 1,000 mcg by mouth daily.     Marland Kitchen docusate sodium (COLACE) 100 MG capsule Take 1 capsule (100 mg total) by mouth 2 (two) times daily as needed for mild constipation. 10 capsule 0  . donepezil (ARICEPT) 5 MG tablet Take 5 mg by mouth at bedtime.     . gabapentin (NEURONTIN) 300 MG capsule 300mg  orally 4 times a day    . HYDROcodone-acetaminophen (NORCO/VICODIN) 5-325 MG per tablet Take 1 tab by mouth every 4-6 hours as needed for pain    . levothyroxine (SYNTHROID, LEVOTHROID) 50 MCG tablet Take 50 mcg by mouth daily before breakfast. Take 30 to 60 minutes before breakfast.    . Multiple Vitamins-Minerals (OCUVITE-LUTEIN PO) Take 1 capsule by mouth daily.     Marland Kitchen omeprazole (PRILOSEC) 20 MG capsule Take 20 mg by mouth 2 (two) times daily.    . ondansetron (ZOFRAN) 4 MG tablet Take 1 tablet (4 mg total) by mouth every 6 (six) hours as needed for nausea. 20 tablet 0  . pravastatin (PRAVACHOL) 20 MG tablet Take 20 mg by mouth daily.    Marland Kitchen senna (SENOKOT) 8.6 MG TABS tablet Take 1 tablet (8.6 mg total) by mouth  daily as needed for mild constipation. 120 each 0  . traMADol (ULTRAM) 50 MG tablet Take 50 mg by mouth 3 (three) times daily as needed for moderate pain or severe pain. pain    . venlafaxine XR (EFFEXOR-XR) 37.5 MG 24 hr capsule Take 1 capsule by mouth daily.     . vitamin E (E-400) 400 UNIT capsule Take 400 Units by mouth daily.    Marland Kitchen zolpidem (AMBIEN) 5 MG tablet Take 5 mg by mouth at bedtime as needed for sleep.     No current facility-administered medications for this visit.    PHYSICAL EXAMINATION:   BP 124/83 mmHg  Pulse 66  Temp(Src) 98 F (36.7 C) (Tympanic)   Resp 18  Ht 5\' 6"  (1.676 m)  Wt 140 lb (63.504 kg)  BMI 22.61 kg/m2  SpO2 98%  Filed Weights   07/04/15 1418  Weight: 140 lb (63.504 kg)    GENERAL:alert, no distress and comfortable; patient is accompanied by her husband. EYES: normal, Conjunctiva are pink and non-injected, sclera clear OROPHARYNX: No bleeding noted  NECK: No thyromegaly LYMPH:  no palpable lymphadenopathy in the cervical, axillary or inguinal LUNGS: clear to auscultation with normal breathing effort; No Wheeze or crackles  Cardio-vascular- Regular Rate & Rythm and no murmurs and no lower extremity edema ABDOMEN:abdomen soft, non-tender and normal bowel sounds; No hepato-splenomegaly.  Musculoskeletal:no cyanosis of digits and no clubbing  NEURO: alert & oriented x 3 with fluent speech, no focal motor/sensory deficits. SKIN: Multiple ecchymosis noted on upper and lower extremities.  LABORATORY DATA:  I have reviewed the data as listed    Component Value Date/Time   NA 137 07/02/2015 0613   NA 143 06/13/2014 1428   K 4.1 07/02/2015 0613   K 4.2 06/13/2014 1428   CL 101 07/02/2015 0613   CL 105 06/13/2014 1428   CO2 26 07/02/2015 0613   CO2 31 06/13/2014 1428   GLUCOSE 80 07/02/2015 0613   GLUCOSE 70 06/13/2014 1428   BUN 12 07/02/2015 0613   BUN 15 06/13/2014 1428   CREATININE 0.74 07/02/2015 0613   CREATININE 1.05 06/13/2014 1428   CALCIUM 8.8* 07/02/2015 0613   CALCIUM 8.5 06/13/2014 1428   PROT 6.4* 06/29/2015 1452   PROT 6.5 06/01/2014 1109   ALBUMIN 3.8 06/29/2015 1452   ALBUMIN 3.4 06/01/2014 1109   AST 59* 06/29/2015 1452   AST 31 06/01/2014 1109   ALT 41 06/29/2015 1452   ALT 76* 06/01/2014 1109   ALKPHOS 80 06/29/2015 1452   ALKPHOS 93 06/01/2014 1109   BILITOT 1.0 06/29/2015 1452   BILITOT 0.3 06/01/2014 1109   GFRNONAA >60 07/02/2015 0613   GFRNONAA 51* 06/13/2014 1428   GFRAA >60 07/02/2015 0613   GFRAA 59* 06/13/2014 1428    No results found for: SPEP, UPEP  Lab Results   Component Value Date   WBC 8.5 07/04/2015   NEUTROABS 4.5 07/04/2015   HGB 13.3 07/04/2015   HCT 39.8 07/04/2015   MCV 90.8 07/04/2015   PLT 162 07/04/2015      Chemistry      Component Value Date/Time   NA 137 07/02/2015 0613   NA 143 06/13/2014 1428   K 4.1 07/02/2015 0613   K 4.2 06/13/2014 1428   CL 101 07/02/2015 0613   CL 105 06/13/2014 1428   CO2 26 07/02/2015 0613   CO2 31 06/13/2014 1428   BUN 12 07/02/2015 0613   BUN 15 06/13/2014 1428   CREATININE 0.74 07/02/2015 9629  CREATININE 1.05 06/13/2014 1428      Component Value Date/Time   CALCIUM 8.8* 07/02/2015 0613   CALCIUM 8.5 06/13/2014 1428   ALKPHOS 80 06/29/2015 1452   ALKPHOS 93 06/01/2014 1109   AST 59* 06/29/2015 1452   AST 31 06/01/2014 1109   ALT 41 06/29/2015 1452   ALT 76* 06/01/2014 1109   BILITOT 1.0 06/29/2015 1452   BILITOT 0.3 06/01/2014 1109       RADIOGRAPHIC STUDIES: I have personally reviewed the radiological images as listed and agreed with the findings in the report. No results found.   ASSESSMENT & PLAN:  Chronic refractory ITP currently on N-plate since October 8563 with very labile response. However today platelets are 162; this is likely from her IVIG that she received in the hospital 3 days ago.  Given the multiple visits to the clinic/ and the subcutaneous nature of injection- I discussed the option of using oral medication promacta once a day. I described the mechanism of action in detail that it stimulates the bone marrow to improve platelet counts. I also discussed the potential multiple drug interactions; and also problems with LFTs; and concern for bone marrow fibrosis [FDA has recently pulled off the warning].   After lengthy discussion; and discussing pros and cons of each therapy- patient agrees to start Promacta. She will be given a prescription for Promacta 50 mg a day; we will check CBC/CMP again the 26th and CBC on 29th.  She will follow-up with me in  approximately 2 weeks with CBC. The above plan of care was discussed the patient has been in detail.  No orders of the defined types were placed in this encounter.   All questions were answered. The patient knows to call the clinic with any problems, questions or concerns. No barriers to learning was detected. & I spent 25 minutes counseling the patient face to face. The total time spent in the appointment was 40 minutes and more than 50% was on counseling and review of test results     Cammie Sickle, MD 07/04/2015 2:24 PM

## 2015-07-05 ENCOUNTER — Inpatient Hospital Stay: Payer: Commercial Managed Care - HMO

## 2015-07-05 NOTE — Progress Notes (Signed)
Referral initiated for promacta 50 mg once a day x 30 days 4 RFs. RX sent to [facility] on 07/05/15

## 2015-07-06 DIAGNOSIS — J45909 Unspecified asthma, uncomplicated: Secondary | ICD-10-CM | POA: Diagnosis not present

## 2015-07-06 DIAGNOSIS — M810 Age-related osteoporosis without current pathological fracture: Secondary | ICD-10-CM | POA: Diagnosis not present

## 2015-07-06 DIAGNOSIS — K219 Gastro-esophageal reflux disease without esophagitis: Secondary | ICD-10-CM | POA: Diagnosis not present

## 2015-07-06 DIAGNOSIS — D693 Immune thrombocytopenic purpura: Secondary | ICD-10-CM | POA: Diagnosis not present

## 2015-07-06 DIAGNOSIS — D649 Anemia, unspecified: Secondary | ICD-10-CM | POA: Diagnosis not present

## 2015-07-06 DIAGNOSIS — F419 Anxiety disorder, unspecified: Secondary | ICD-10-CM | POA: Diagnosis not present

## 2015-07-06 DIAGNOSIS — F329 Major depressive disorder, single episode, unspecified: Secondary | ICD-10-CM | POA: Diagnosis not present

## 2015-07-06 DIAGNOSIS — M150 Primary generalized (osteo)arthritis: Secondary | ICD-10-CM | POA: Diagnosis not present

## 2015-07-06 DIAGNOSIS — F0390 Unspecified dementia without behavioral disturbance: Secondary | ICD-10-CM | POA: Diagnosis not present

## 2015-07-08 ENCOUNTER — Inpatient Hospital Stay: Payer: Commercial Managed Care - HMO

## 2015-07-08 ENCOUNTER — Encounter: Payer: Self-pay | Admitting: *Deleted

## 2015-07-08 DIAGNOSIS — D693 Immune thrombocytopenic purpura: Secondary | ICD-10-CM

## 2015-07-08 DIAGNOSIS — G309 Alzheimer's disease, unspecified: Secondary | ICD-10-CM | POA: Diagnosis not present

## 2015-07-08 DIAGNOSIS — Z79899 Other long term (current) drug therapy: Secondary | ICD-10-CM | POA: Diagnosis not present

## 2015-07-08 DIAGNOSIS — Z23 Encounter for immunization: Secondary | ICD-10-CM | POA: Diagnosis not present

## 2015-07-08 DIAGNOSIS — I10 Essential (primary) hypertension: Secondary | ICD-10-CM | POA: Diagnosis not present

## 2015-07-08 DIAGNOSIS — R5383 Other fatigue: Secondary | ICD-10-CM | POA: Diagnosis not present

## 2015-07-08 DIAGNOSIS — Z9081 Acquired absence of spleen: Secondary | ICD-10-CM | POA: Diagnosis not present

## 2015-07-08 DIAGNOSIS — F419 Anxiety disorder, unspecified: Secondary | ICD-10-CM | POA: Diagnosis not present

## 2015-07-08 DIAGNOSIS — R5381 Other malaise: Secondary | ICD-10-CM | POA: Diagnosis not present

## 2015-07-08 DIAGNOSIS — D473 Essential (hemorrhagic) thrombocythemia: Secondary | ICD-10-CM | POA: Diagnosis not present

## 2015-07-08 DIAGNOSIS — R300 Dysuria: Secondary | ICD-10-CM | POA: Diagnosis not present

## 2015-07-08 LAB — CBC WITH DIFFERENTIAL/PLATELET
BASOS ABS: 0.1 10*3/uL (ref 0–0.1)
Basophils Relative: 1 %
Eosinophils Absolute: 0.2 10*3/uL (ref 0–0.7)
Eosinophils Relative: 1 %
HEMATOCRIT: 40.1 % (ref 35.0–47.0)
Hemoglobin: 13.2 g/dL (ref 12.0–16.0)
LYMPHS PCT: 30 %
Lymphs Abs: 3.2 10*3/uL (ref 1.0–3.6)
MCH: 29.9 pg (ref 26.0–34.0)
MCHC: 32.8 g/dL (ref 32.0–36.0)
MCV: 91.2 fL (ref 80.0–100.0)
Monocytes Absolute: 0.6 10*3/uL (ref 0.2–0.9)
Monocytes Relative: 6 %
NEUTROS ABS: 6.7 10*3/uL — AB (ref 1.4–6.5)
Neutrophils Relative %: 62 %
Platelets: 257 10*3/uL (ref 150–440)
RBC: 4.4 MIL/uL (ref 3.80–5.20)
RDW: 15.6 % — ABNORMAL HIGH (ref 11.5–14.5)
WBC: 10.8 10*3/uL (ref 3.6–11.0)

## 2015-07-08 LAB — COMPREHENSIVE METABOLIC PANEL
ALBUMIN: 3.6 g/dL (ref 3.5–5.0)
ALT: 52 U/L (ref 14–54)
ANION GAP: 3 — AB (ref 5–15)
AST: 49 U/L — ABNORMAL HIGH (ref 15–41)
Alkaline Phosphatase: 90 U/L (ref 38–126)
BILIRUBIN TOTAL: 0.2 mg/dL — AB (ref 0.3–1.2)
BUN: 19 mg/dL (ref 6–20)
CO2: 30 mmol/L (ref 22–32)
Calcium: 8.6 mg/dL — ABNORMAL LOW (ref 8.9–10.3)
Chloride: 103 mmol/L (ref 101–111)
Creatinine, Ser: 0.82 mg/dL (ref 0.44–1.00)
GFR calc Af Amer: >60 mL/min (ref >60)
GLUCOSE: 119 mg/dL — AB (ref 65–99)
POTASSIUM: 3.8 mmol/L (ref 3.5–5.1)
Sodium: 136 mmol/L (ref 135–145)
TOTAL PROTEIN: 7.7 g/dL (ref 6.5–8.1)

## 2015-07-08 NOTE — Progress Notes (Signed)
Lab results reviewed with patient, while patient in the office. Her platelets are normal at 257 today. She will come back on Thursday for her repeat lab draw. She understands that her medications will come through [facility] instead of [facility].

## 2015-07-08 NOTE — Addendum Note (Signed)
Addended by: Sabino Gasser on: 07/08/2015 04:17 PM   Modules accepted: Medications

## 2015-07-08 NOTE — Progress Notes (Signed)
RX for promacta has been sent to pharmacy

## 2015-07-09 DIAGNOSIS — K219 Gastro-esophageal reflux disease without esophagitis: Secondary | ICD-10-CM | POA: Diagnosis not present

## 2015-07-09 DIAGNOSIS — F0390 Unspecified dementia without behavioral disturbance: Secondary | ICD-10-CM | POA: Diagnosis not present

## 2015-07-09 DIAGNOSIS — D693 Immune thrombocytopenic purpura: Secondary | ICD-10-CM | POA: Diagnosis not present

## 2015-07-09 DIAGNOSIS — M810 Age-related osteoporosis without current pathological fracture: Secondary | ICD-10-CM | POA: Diagnosis not present

## 2015-07-09 DIAGNOSIS — M150 Primary generalized (osteo)arthritis: Secondary | ICD-10-CM | POA: Diagnosis not present

## 2015-07-09 DIAGNOSIS — J45909 Unspecified asthma, uncomplicated: Secondary | ICD-10-CM | POA: Diagnosis not present

## 2015-07-09 DIAGNOSIS — F419 Anxiety disorder, unspecified: Secondary | ICD-10-CM | POA: Diagnosis not present

## 2015-07-09 DIAGNOSIS — D649 Anemia, unspecified: Secondary | ICD-10-CM | POA: Diagnosis not present

## 2015-07-09 DIAGNOSIS — F329 Major depressive disorder, single episode, unspecified: Secondary | ICD-10-CM | POA: Diagnosis not present

## 2015-07-11 ENCOUNTER — Ambulatory Visit: Payer: Commercial Managed Care - HMO

## 2015-07-11 ENCOUNTER — Other Ambulatory Visit: Payer: Self-pay | Admitting: Gastroenterology

## 2015-07-11 ENCOUNTER — Inpatient Hospital Stay: Payer: Commercial Managed Care - HMO

## 2015-07-11 ENCOUNTER — Telehealth: Payer: Self-pay | Admitting: *Deleted

## 2015-07-11 DIAGNOSIS — R1313 Dysphagia, pharyngeal phase: Secondary | ICD-10-CM | POA: Diagnosis not present

## 2015-07-11 DIAGNOSIS — Z1211 Encounter for screening for malignant neoplasm of colon: Secondary | ICD-10-CM | POA: Diagnosis not present

## 2015-07-11 DIAGNOSIS — R1310 Dysphagia, unspecified: Secondary | ICD-10-CM

## 2015-07-11 NOTE — Telephone Encounter (Signed)
RN Called patient. Since [facility] did not open today, i wanted to make sure patient did not have any signs of bleeding or hemmorrhage given the patient's long standing h/o ITP. Patient states she was unable to come to the clinic on Friday for her lab only and would rather wait until Tuesday next week. Advised patient that she if she should have any signs of nosebleeds or increased bruising's, she will need to come to the clinic sooner, go to ED or notify the on call md.

## 2015-07-12 ENCOUNTER — Encounter: Payer: Self-pay | Admitting: *Deleted

## 2015-07-12 ENCOUNTER — Inpatient Hospital Stay: Payer: Commercial Managed Care - HMO

## 2015-07-12 ENCOUNTER — Other Ambulatory Visit: Payer: Self-pay | Admitting: *Deleted

## 2015-07-12 DIAGNOSIS — M150 Primary generalized (osteo)arthritis: Secondary | ICD-10-CM | POA: Diagnosis not present

## 2015-07-12 DIAGNOSIS — D693 Immune thrombocytopenic purpura: Secondary | ICD-10-CM | POA: Diagnosis not present

## 2015-07-12 DIAGNOSIS — J45909 Unspecified asthma, uncomplicated: Secondary | ICD-10-CM | POA: Diagnosis not present

## 2015-07-12 DIAGNOSIS — F419 Anxiety disorder, unspecified: Secondary | ICD-10-CM | POA: Diagnosis not present

## 2015-07-12 DIAGNOSIS — F329 Major depressive disorder, single episode, unspecified: Secondary | ICD-10-CM | POA: Diagnosis not present

## 2015-07-12 DIAGNOSIS — K219 Gastro-esophageal reflux disease without esophagitis: Secondary | ICD-10-CM | POA: Diagnosis not present

## 2015-07-12 DIAGNOSIS — F0390 Unspecified dementia without behavioral disturbance: Secondary | ICD-10-CM | POA: Diagnosis not present

## 2015-07-12 DIAGNOSIS — M810 Age-related osteoporosis without current pathological fracture: Secondary | ICD-10-CM | POA: Diagnosis not present

## 2015-07-12 DIAGNOSIS — D649 Anemia, unspecified: Secondary | ICD-10-CM | POA: Diagnosis not present

## 2015-07-12 NOTE — Telephone Encounter (Signed)
Received fax from [facility] today. [facility] would not accept the RX that was forwarded from [facility].

## 2015-07-15 DIAGNOSIS — D693 Immune thrombocytopenic purpura: Secondary | ICD-10-CM | POA: Diagnosis not present

## 2015-07-15 DIAGNOSIS — K219 Gastro-esophageal reflux disease without esophagitis: Secondary | ICD-10-CM | POA: Diagnosis not present

## 2015-07-15 DIAGNOSIS — M150 Primary generalized (osteo)arthritis: Secondary | ICD-10-CM | POA: Diagnosis not present

## 2015-07-15 DIAGNOSIS — D649 Anemia, unspecified: Secondary | ICD-10-CM | POA: Diagnosis not present

## 2015-07-15 DIAGNOSIS — F329 Major depressive disorder, single episode, unspecified: Secondary | ICD-10-CM | POA: Diagnosis not present

## 2015-07-15 DIAGNOSIS — F0390 Unspecified dementia without behavioral disturbance: Secondary | ICD-10-CM | POA: Diagnosis not present

## 2015-07-15 DIAGNOSIS — F419 Anxiety disorder, unspecified: Secondary | ICD-10-CM | POA: Diagnosis not present

## 2015-07-15 DIAGNOSIS — M810 Age-related osteoporosis without current pathological fracture: Secondary | ICD-10-CM | POA: Diagnosis not present

## 2015-07-15 DIAGNOSIS — J45909 Unspecified asthma, uncomplicated: Secondary | ICD-10-CM | POA: Diagnosis not present

## 2015-07-16 ENCOUNTER — Inpatient Hospital Stay: Payer: Commercial Managed Care - HMO | Attending: Internal Medicine

## 2015-07-16 ENCOUNTER — Other Ambulatory Visit: Payer: Self-pay | Admitting: Internal Medicine

## 2015-07-16 ENCOUNTER — Ambulatory Visit: Payer: Commercial Managed Care - HMO

## 2015-07-16 DIAGNOSIS — D693 Immune thrombocytopenic purpura: Secondary | ICD-10-CM | POA: Diagnosis not present

## 2015-07-16 DIAGNOSIS — Z9081 Acquired absence of spleen: Secondary | ICD-10-CM | POA: Insufficient documentation

## 2015-07-16 DIAGNOSIS — D649 Anemia, unspecified: Secondary | ICD-10-CM | POA: Diagnosis not present

## 2015-07-16 DIAGNOSIS — F0390 Unspecified dementia without behavioral disturbance: Secondary | ICD-10-CM | POA: Diagnosis not present

## 2015-07-16 DIAGNOSIS — R630 Anorexia: Secondary | ICD-10-CM | POA: Diagnosis not present

## 2015-07-16 DIAGNOSIS — J45909 Unspecified asthma, uncomplicated: Secondary | ICD-10-CM | POA: Diagnosis not present

## 2015-07-16 DIAGNOSIS — Z79899 Other long term (current) drug therapy: Secondary | ICD-10-CM | POA: Diagnosis not present

## 2015-07-16 DIAGNOSIS — M810 Age-related osteoporosis without current pathological fracture: Secondary | ICD-10-CM | POA: Diagnosis not present

## 2015-07-16 DIAGNOSIS — R531 Weakness: Secondary | ICD-10-CM | POA: Diagnosis not present

## 2015-07-16 DIAGNOSIS — R5383 Other fatigue: Secondary | ICD-10-CM | POA: Insufficient documentation

## 2015-07-16 DIAGNOSIS — M150 Primary generalized (osteo)arthritis: Secondary | ICD-10-CM | POA: Diagnosis not present

## 2015-07-16 DIAGNOSIS — R197 Diarrhea, unspecified: Secondary | ICD-10-CM | POA: Diagnosis not present

## 2015-07-16 DIAGNOSIS — F419 Anxiety disorder, unspecified: Secondary | ICD-10-CM | POA: Diagnosis not present

## 2015-07-16 DIAGNOSIS — K219 Gastro-esophageal reflux disease without esophagitis: Secondary | ICD-10-CM | POA: Diagnosis not present

## 2015-07-16 DIAGNOSIS — F329 Major depressive disorder, single episode, unspecified: Secondary | ICD-10-CM | POA: Diagnosis not present

## 2015-07-16 LAB — CBC WITH DIFFERENTIAL/PLATELET
BASOS ABS: 0.1 10*3/uL (ref 0–0.1)
Basophils Relative: 1 %
Eosinophils Absolute: 0.1 10*3/uL (ref 0–0.7)
HCT: 43.2 % (ref 35.0–47.0)
HEMOGLOBIN: 14.2 g/dL (ref 12.0–16.0)
Lymphs Abs: 2.2 10*3/uL (ref 1.0–3.6)
MCH: 30 pg (ref 26.0–34.0)
MCHC: 32.9 g/dL (ref 32.0–36.0)
MCV: 91.2 fL (ref 80.0–100.0)
Monocytes Absolute: 0.6 10*3/uL (ref 0.2–0.9)
Monocytes Relative: 7 %
Neutro Abs: 6.2 10*3/uL (ref 1.4–6.5)
PLATELETS: 78 10*3/uL — AB (ref 150–440)
RBC: 4.73 MIL/uL (ref 3.80–5.20)
RDW: 16 % — ABNORMAL HIGH (ref 11.5–14.5)
WBC: 9.3 10*3/uL (ref 3.6–11.0)

## 2015-07-16 MED ORDER — ROMIPLOSTIM 250 MCG ~~LOC~~ SOLR: 2.0000 ug/kg | Freq: Once | SUBCUTANEOUS | Status: DC

## 2015-07-17 ENCOUNTER — Ambulatory Visit
Admission: RE | Admit: 2015-07-17 | Discharge: 2015-07-17 | Disposition: A | Discharge status: Home / Self Care | Payer: Commercial Managed Care - HMO | Source: Ambulatory Visit | Attending: Gastroenterology | Admitting: Gastroenterology

## 2015-07-17 DIAGNOSIS — K449 Diaphragmatic hernia without obstruction or gangrene: Secondary | ICD-10-CM | POA: Insufficient documentation

## 2015-07-17 DIAGNOSIS — R1310 Dysphagia, unspecified: Secondary | ICD-10-CM

## 2015-07-17 DIAGNOSIS — K219 Gastro-esophageal reflux disease without esophagitis: Secondary | ICD-10-CM | POA: Diagnosis not present

## 2015-07-17 DIAGNOSIS — R1313 Dysphagia, pharyngeal phase: Secondary | ICD-10-CM | POA: Diagnosis not present

## 2015-07-18 ENCOUNTER — Telehealth: Payer: Self-pay | Admitting: *Deleted

## 2015-07-18 ENCOUNTER — Ambulatory Visit: Payer: Commercial Managed Care - HMO

## 2015-07-18 ENCOUNTER — Ambulatory Visit: Payer: Commercial Managed Care - HMO | Admitting: Internal Medicine

## 2015-07-18 ENCOUNTER — Inpatient Hospital Stay: Payer: Commercial Managed Care - HMO

## 2015-07-18 ENCOUNTER — Other Ambulatory Visit: Payer: Commercial Managed Care - HMO

## 2015-07-18 ENCOUNTER — Ambulatory Visit: Payer: Commercial Managed Care - HMO | Admitting: Family Medicine

## 2015-07-18 DIAGNOSIS — K219 Gastro-esophageal reflux disease without esophagitis: Secondary | ICD-10-CM | POA: Diagnosis not present

## 2015-07-18 DIAGNOSIS — F0390 Unspecified dementia without behavioral disturbance: Secondary | ICD-10-CM | POA: Diagnosis not present

## 2015-07-18 DIAGNOSIS — M150 Primary generalized (osteo)arthritis: Secondary | ICD-10-CM | POA: Diagnosis not present

## 2015-07-18 DIAGNOSIS — D649 Anemia, unspecified: Secondary | ICD-10-CM | POA: Diagnosis not present

## 2015-07-18 DIAGNOSIS — F329 Major depressive disorder, single episode, unspecified: Secondary | ICD-10-CM | POA: Diagnosis not present

## 2015-07-18 DIAGNOSIS — D693 Immune thrombocytopenic purpura: Secondary | ICD-10-CM | POA: Diagnosis not present

## 2015-07-18 DIAGNOSIS — M810 Age-related osteoporosis without current pathological fracture: Secondary | ICD-10-CM | POA: Diagnosis not present

## 2015-07-18 DIAGNOSIS — J45909 Unspecified asthma, uncomplicated: Secondary | ICD-10-CM | POA: Diagnosis not present

## 2015-07-18 DIAGNOSIS — F419 Anxiety disorder, unspecified: Secondary | ICD-10-CM | POA: Diagnosis not present

## 2015-07-18 NOTE — Telephone Encounter (Signed)
Patient no showed for lab/nplate injection today.  RN r/s these appointment for 07/19/15 130pm. Patient is aware.

## 2015-07-19 ENCOUNTER — Inpatient Hospital Stay: Payer: Commercial Managed Care - HMO

## 2015-07-19 ENCOUNTER — Other Ambulatory Visit: Payer: Commercial Managed Care - HMO

## 2015-07-19 ENCOUNTER — Ambulatory Visit: Payer: Commercial Managed Care - HMO

## 2015-07-19 ENCOUNTER — Ambulatory Visit: Payer: Commercial Managed Care - HMO | Admitting: Internal Medicine

## 2015-07-19 VITALS — BP 119/90 | HR 68 | Temp 98.9°F | Resp 16

## 2015-07-19 DIAGNOSIS — R5383 Other fatigue: Secondary | ICD-10-CM | POA: Diagnosis not present

## 2015-07-19 DIAGNOSIS — R197 Diarrhea, unspecified: Secondary | ICD-10-CM | POA: Diagnosis not present

## 2015-07-19 DIAGNOSIS — R630 Anorexia: Secondary | ICD-10-CM | POA: Diagnosis not present

## 2015-07-19 DIAGNOSIS — D693 Immune thrombocytopenic purpura: Secondary | ICD-10-CM | POA: Diagnosis not present

## 2015-07-19 DIAGNOSIS — Z79899 Other long term (current) drug therapy: Secondary | ICD-10-CM | POA: Diagnosis not present

## 2015-07-19 DIAGNOSIS — R531 Weakness: Secondary | ICD-10-CM | POA: Diagnosis not present

## 2015-07-19 DIAGNOSIS — Z9081 Acquired absence of spleen: Secondary | ICD-10-CM | POA: Diagnosis not present

## 2015-07-19 LAB — CBC WITH DIFFERENTIAL/PLATELET
BASOS ABS: 0.1 10*3/uL (ref 0–0.1)
BASOS ABS: 0.1 10*3/uL (ref 0–0.1)
BASOS PCT: 1 %
BASOS PCT: 1 %
EOS PCT: 2 %
EOS PCT: 2 %
Eosinophils Absolute: 0.2 10*3/uL (ref 0–0.7)
Eosinophils Absolute: 0.2 10*3/uL (ref 0–0.7)
HCT: 39.8 % (ref 35.0–47.0)
HCT: 41.4 % (ref 35.0–47.0)
Hemoglobin: 13.3 g/dL (ref 12.0–16.0)
Hemoglobin: 13.8 g/dL (ref 12.0–16.0)
LYMPHS PCT: 30 %
Lymphocytes Relative: 35 %
Lymphs Abs: 2.9 10*3/uL (ref 1.0–3.6)
Lymphs Abs: 2.5 10*3/uL (ref 1.0–3.6)
MCH: 30.3 pg (ref 26.0–34.0)
MCH: 30.3 pg (ref 26.0–34.0)
MCHC: 33.4 g/dL (ref 32.0–36.0)
MCHC: 33.3 g/dL (ref 32.0–36.0)
MCV: 90.8 fL (ref 80.0–100.0)
MCV: 91 fL (ref 80.0–100.0)
MONO ABS: 0.8 10*3/uL (ref 0.2–0.9)
MONO ABS: 0.6 10*3/uL (ref 0.2–0.9)
Monocytes Relative: 9 %
Monocytes Relative: 7 %
NEUTROS ABS: 4.5 10*3/uL (ref 1.4–6.5)
Neutro Abs: 4.9 10*3/uL (ref 1.4–6.5)
Neutrophils Relative %: 53 %
Neutrophils Relative %: 60 %
PLATELETS: 162 10*3/uL (ref 150–440)
PLATELETS: DECREASED 10*3/uL (ref 150–440)
RBC: 4.38 MIL/uL (ref 3.80–5.20)
RBC: 4.55 MIL/uL (ref 3.80–5.20)
RDW: 15.2 % — AB (ref 11.5–14.5)
RDW: 15.7 % — AB (ref 11.5–14.5)
WBC: 8.5 10*3/uL (ref 3.6–11.0)
WBC: 8.2 10*3/uL (ref 3.6–11.0)

## 2015-07-19 LAB — FERRITIN: FERRITIN: 74 ng/mL (ref 11–307)

## 2015-07-19 MED ORDER — ROMIPLOSTIM 250 MCG ~~LOC~~ SOLR
127.0000 ug | Freq: Once | SUBCUTANEOUS | Status: AC
  Administered 2015-07-19: 125 ug via SUBCUTANEOUS
  Filled 2015-07-19: qty 0.25

## 2015-07-19 NOTE — Progress Notes (Signed)
Received call from [facility] in lab. "plt count appears decreased. No clump. Large platelets.  Machine reading at 39. Manual estimate registered at 20."

## 2015-07-19 NOTE — Progress Notes (Signed)
Called lab. "platlet count estimating between 34-35. diff being performed." Spoke with Myra in Pharmacy. Giving these values, patient will need still her Nplate injection today per md.

## 2015-07-20 LAB — VITAMIN B12: VITAMIN B 12: 2975 pg/mL — AB (ref 180–914)

## 2015-07-22 ENCOUNTER — Other Ambulatory Visit: Payer: Commercial Managed Care - HMO

## 2015-07-22 ENCOUNTER — Inpatient Hospital Stay: Payer: Commercial Managed Care - HMO

## 2015-07-22 ENCOUNTER — Inpatient Hospital Stay: Payer: Commercial Managed Care - HMO | Admitting: Oncology

## 2015-07-22 ENCOUNTER — Ambulatory Visit: Payer: Commercial Managed Care - HMO

## 2015-07-22 ENCOUNTER — Ambulatory Visit: Payer: Commercial Managed Care - HMO | Admitting: Oncology

## 2015-07-22 ENCOUNTER — Inpatient Hospital Stay (HOSPITAL_BASED_OUTPATIENT_CLINIC_OR_DEPARTMENT_OTHER): Payer: Commercial Managed Care - HMO | Admitting: Oncology

## 2015-07-22 VITALS — BP 144/80 | HR 66 | Temp 97.1°F | Resp 16 | Wt 137.6 lb

## 2015-07-22 DIAGNOSIS — R5383 Other fatigue: Secondary | ICD-10-CM

## 2015-07-22 DIAGNOSIS — D693 Immune thrombocytopenic purpura: Secondary | ICD-10-CM | POA: Diagnosis not present

## 2015-07-22 DIAGNOSIS — M150 Primary generalized (osteo)arthritis: Secondary | ICD-10-CM | POA: Diagnosis not present

## 2015-07-22 DIAGNOSIS — Z79899 Other long term (current) drug therapy: Secondary | ICD-10-CM

## 2015-07-22 DIAGNOSIS — R197 Diarrhea, unspecified: Secondary | ICD-10-CM | POA: Diagnosis not present

## 2015-07-22 DIAGNOSIS — Z9081 Acquired absence of spleen: Secondary | ICD-10-CM

## 2015-07-22 DIAGNOSIS — J45909 Unspecified asthma, uncomplicated: Secondary | ICD-10-CM | POA: Diagnosis not present

## 2015-07-22 DIAGNOSIS — F0390 Unspecified dementia without behavioral disturbance: Secondary | ICD-10-CM | POA: Diagnosis not present

## 2015-07-22 DIAGNOSIS — R531 Weakness: Secondary | ICD-10-CM

## 2015-07-22 DIAGNOSIS — R630 Anorexia: Secondary | ICD-10-CM

## 2015-07-22 LAB — CBC WITH DIFFERENTIAL/PLATELET
BASOS ABS: 0.1 10*3/uL (ref 0–0.1)
Basophils Relative: 1 %
EOS ABS: 0.1 10*3/uL (ref 0–0.7)
EOS PCT: 1 %
HEMATOCRIT: 41.6 % (ref 35.0–47.0)
Hemoglobin: 13.7 g/dL (ref 12.0–16.0)
LYMPHS ABS: 3.1 10*3/uL (ref 1.0–3.6)
LYMPHS PCT: 27 %
MCH: 30.2 pg (ref 26.0–34.0)
MCHC: 32.9 g/dL (ref 32.0–36.0)
MCV: 91.9 fL (ref 80.0–100.0)
Monocytes Absolute: 0.7 10*3/uL (ref 0.2–0.9)
Monocytes Relative: 6 %
NEUTROS PCT: 65 %
Neutro Abs: 7.6 10*3/uL — ABNORMAL HIGH (ref 1.4–6.5)
PLATELETS: 35 10*3/uL — AB (ref 150–440)
RBC: 4.53 MIL/uL (ref 3.80–5.20)
RDW: 16.2 % — AB (ref 11.5–14.5)
WBC: 11.7 10*3/uL — AB (ref 3.6–11.0)

## 2015-07-22 NOTE — Progress Notes (Signed)
Patient is not feeling well today due to diarrhea that started today and loss of appetite.

## 2015-07-23 ENCOUNTER — Telehealth: Payer: Self-pay | Admitting: *Deleted

## 2015-07-23 ENCOUNTER — Emergency Department
Admission: EM | Admit: 2015-07-23 | Discharge: 2015-07-23 | Disposition: A | Discharge status: Home / Self Care | Payer: Commercial Managed Care - HMO | Attending: Emergency Medicine | Admitting: Emergency Medicine

## 2015-07-23 ENCOUNTER — Ambulatory Visit: Payer: Commercial Managed Care - HMO | Admitting: Oncology

## 2015-07-23 ENCOUNTER — Ambulatory Visit: Payer: Commercial Managed Care - HMO

## 2015-07-23 ENCOUNTER — Inpatient Hospital Stay: Payer: Commercial Managed Care - HMO

## 2015-07-23 ENCOUNTER — Encounter: Payer: Self-pay | Admitting: Emergency Medicine

## 2015-07-23 VITALS — BP 133/58 | HR 70 | Temp 99.1°F | Resp 18

## 2015-07-23 DIAGNOSIS — K2971 Gastritis, unspecified, with bleeding: Secondary | ICD-10-CM | POA: Insufficient documentation

## 2015-07-23 DIAGNOSIS — R7989 Other specified abnormal findings of blood chemistry: Secondary | ICD-10-CM | POA: Diagnosis present

## 2015-07-23 DIAGNOSIS — Z79899 Other long term (current) drug therapy: Secondary | ICD-10-CM | POA: Insufficient documentation

## 2015-07-23 DIAGNOSIS — K2970 Gastritis, unspecified, without bleeding: Secondary | ICD-10-CM

## 2015-07-23 DIAGNOSIS — Z9081 Acquired absence of spleen: Secondary | ICD-10-CM | POA: Diagnosis not present

## 2015-07-23 DIAGNOSIS — R630 Anorexia: Secondary | ICD-10-CM | POA: Diagnosis not present

## 2015-07-23 DIAGNOSIS — E876 Hypokalemia: Secondary | ICD-10-CM | POA: Diagnosis not present

## 2015-07-23 DIAGNOSIS — D693 Immune thrombocytopenic purpura: Secondary | ICD-10-CM | POA: Diagnosis not present

## 2015-07-23 DIAGNOSIS — R5383 Other fatigue: Secondary | ICD-10-CM | POA: Diagnosis not present

## 2015-07-23 DIAGNOSIS — Z7902 Long term (current) use of antithrombotics/antiplatelets: Secondary | ICD-10-CM | POA: Diagnosis not present

## 2015-07-23 DIAGNOSIS — R531 Weakness: Secondary | ICD-10-CM | POA: Diagnosis not present

## 2015-07-23 DIAGNOSIS — R197 Diarrhea, unspecified: Secondary | ICD-10-CM | POA: Diagnosis not present

## 2015-07-23 DIAGNOSIS — Z862 Personal history of diseases of the blood and blood-forming organs and certain disorders involving the immune mechanism: Secondary | ICD-10-CM | POA: Diagnosis not present

## 2015-07-23 LAB — URINALYSIS COMPLETE WITH MICROSCOPIC (ARMC ONLY)
BACTERIA UA: NONE SEEN
Bilirubin Urine: NEGATIVE
Glucose, UA: NEGATIVE mg/dL
Hgb urine dipstick: NEGATIVE
KETONES UR: NEGATIVE mg/dL
NITRITE: NEGATIVE
PH: 6 (ref 5.0–8.0)
PROTEIN: NEGATIVE mg/dL
SPECIFIC GRAVITY, URINE: 1.012 (ref 1.005–1.030)

## 2015-07-23 LAB — PROTIME-INR
INR: 1.02
Prothrombin Time: 13.6 seconds (ref 11.4–15.0)

## 2015-07-23 LAB — COMPREHENSIVE METABOLIC PANEL
ALBUMIN: 3.5 g/dL (ref 3.5–5.0)
ALK PHOS: 70 U/L (ref 38–126)
ALT: 20 U/L (ref 14–54)
ANION GAP: 3 — AB (ref 5–15)
AST: 29 U/L (ref 15–41)
BUN: 12 mg/dL (ref 6–20)
CALCIUM: 8.8 mg/dL — AB (ref 8.9–10.3)
CHLORIDE: 107 mmol/L (ref 101–111)
CO2: 25 mmol/L (ref 22–32)
Creatinine, Ser: 0.84 mg/dL (ref 0.44–1.00)
GFR calc non Af Amer: >60 mL/min (ref >60)
GLUCOSE: 90 mg/dL (ref 65–99)
POTASSIUM: 3 mmol/L — AB (ref 3.5–5.1)
SODIUM: 135 mmol/L (ref 135–145)
Total Bilirubin: 0.8 mg/dL (ref 0.3–1.2)
Total Protein: 7.3 g/dL (ref 6.5–8.1)

## 2015-07-23 LAB — CBC
HEMATOCRIT: 39.1 % (ref 35.0–47.0)
HEMOGLOBIN: 13 g/dL (ref 12.0–16.0)
MCH: 30.6 pg (ref 26.0–34.0)
MCHC: 33.1 g/dL (ref 32.0–36.0)
MCV: 92.5 fL (ref 80.0–100.0)
Platelets: 70 10*3/uL — ABNORMAL LOW (ref 150–440)
RBC: 4.23 MIL/uL (ref 3.80–5.20)
RDW: 16.1 % — ABNORMAL HIGH (ref 11.5–14.5)
WBC: 9.2 10*3/uL (ref 3.6–11.0)

## 2015-07-23 LAB — TYPE AND SCREEN
ABO/RH(D): B POS
Antibody Screen: NEGATIVE

## 2015-07-23 LAB — LIPASE, BLOOD: LIPASE: 34 U/L (ref 22–51)

## 2015-07-23 MED ORDER — DIPHENHYDRAMINE HCL 25 MG PO TABS
25.0000 mg | ORAL_TABLET | ORAL | Status: AC | PRN
  Administered 2015-07-23: 25 mg via ORAL
  Filled 2015-07-23: qty 1

## 2015-07-23 MED ORDER — PANTOPRAZOLE SODIUM 40 MG IV SOLR
40.0000 mg | Freq: Once | INTRAVENOUS | Status: AC
  Administered 2015-07-23: 40 mg via INTRAVENOUS
  Filled 2015-07-23: qty 40

## 2015-07-23 MED ORDER — POTASSIUM CHLORIDE CRYS ER 20 MEQ PO TBCR
20.0000 meq | EXTENDED_RELEASE_TABLET | Freq: Once | ORAL | Status: AC
  Administered 2015-07-23: 20 meq via ORAL
  Filled 2015-07-23: qty 1

## 2015-07-23 MED ORDER — IMMUNE GLOBULIN (HUMAN) 10 GM/100ML IV SOLN
0.4000 g/kg | Freq: Once | INTRAVENOUS | Status: AC
  Administered 2015-07-23: 25 g via INTRAVENOUS
  Filled 2015-07-23 (×2): qty 250

## 2015-07-23 MED ORDER — PANTOPRAZOLE SODIUM 20 MG PO TBEC: 20.0000 mg | DELAYED_RELEASE_TABLET | Freq: Every day | ORAL | Status: DC

## 2015-07-23 MED ORDER — SODIUM CHLORIDE 0.9 % IV SOLN
Freq: Once | INTRAVENOUS | Status: AC
  Administered 2015-07-23: 09:00:00 via INTRAVENOUS
  Filled 2015-07-23: qty 1000

## 2015-07-23 MED ORDER — ACETAMINOPHEN 325 MG PO TABS
650.0000 mg | ORAL_TABLET | Freq: Four times a day (QID) | ORAL | Status: DC | PRN
  Administered 2015-07-23: 650 mg via ORAL
  Filled 2015-07-23: qty 2

## 2015-07-23 NOTE — Telephone Encounter (Signed)
Called to speak with patients family regarding black, tarry stools. Family states patient is currently laying down. I informed him it was Dr. Gary Fleet recommendation that she be evaluated in the ER. Patients family states he will take her to the ER if she has any more dark, tarry stools. He also states patient has no other signs of bleeding.

## 2015-07-23 NOTE — ED Notes (Signed)
abd pain with black tarry stools

## 2015-07-23 NOTE — Discharge Instructions (Signed)
Gastritis, Adult Gastritis is soreness and swelling (inflammation) of the lining of the stomach. Gastritis can develop as a sudden onset (acute) or long-term (chronic) condition. If gastritis is not treated, it can lead to stomach bleeding and ulcers. CAUSES  Gastritis occurs when the stomach lining is weak or damaged. Digestive juices from the stomach then inflame the weakened stomach lining. The stomach lining may be weak or damaged due to viral or bacterial infections. One common bacterial infection is the Helicobacter pylori infection. Gastritis can also result from excessive alcohol consumption, taking certain medicines, or having too much acid in the stomach.  SYMPTOMS  In some cases, there are no symptoms. When symptoms are present, they may include:  Pain or a burning sensation in the upper abdomen.  Nausea.  Vomiting.  An uncomfortable feeling of fullness after eating. DIAGNOSIS  Your caregiver may suspect you have gastritis based on your symptoms and a physical exam. To determine the cause of your gastritis, your caregiver may perform the following:  Blood or stool tests to check for the H pylori bacterium.  Gastroscopy. A thin, flexible tube (endoscope) is passed down the esophagus and into the stomach. The endoscope has a light and camera on the end. Your caregiver uses the endoscope to view the inside of the stomach.  Taking a tissue sample (biopsy) from the stomach to examine under a microscope. TREATMENT  Depending on the cause of your gastritis, medicines may be prescribed. If you have a bacterial infection, such as an H pylori infection, antibiotics may be given. If your gastritis is caused by too much acid in the stomach, H2 blockers or antacids may be given. Your caregiver may recommend that you stop taking aspirin, ibuprofen, or other nonsteroidal anti-inflammatory drugs (NSAIDs). HOME CARE INSTRUCTIONS  Only take over-the-counter or prescription medicines as directed by  your caregiver.  If you were given antibiotic medicines, take them as directed. Finish them even if you start to feel better.  Drink enough fluids to keep your urine clear or pale yellow.  Avoid foods and drinks that make your symptoms worse, such as:  Caffeine or alcoholic drinks.  Chocolate.  Peppermint or mint flavorings.  Garlic and onions.  Spicy foods.  Citrus fruits, such as oranges, lemons, or limes.  Tomato-based foods such as sauce, chili, salsa, and pizza.  Fried and fatty foods.  Eat small, frequent meals instead of large meals. SEEK IMMEDIATE MEDICAL CARE IF:   You have black or dark red stools.  You vomit blood or material that looks like coffee grounds.  You are unable to keep fluids down.  Your abdominal pain gets worse.  You have a fever.  You do not feel better after 1 week.  You have any other questions or concerns. MAKE SURE YOU:  Understand these instructions.  Will watch your condition.  Will get help right away if you are not doing well or get worse.   This information is not intended to replace advice given to you by your health care provider. Make sure you discuss any questions you have with your health care provider.   Document Released: 09/22/2001 Document Revised: 03/29/2012 Document Reviewed: 11/11/2011 Elsevier Interactive Patient Education [employer].   Please return immediately if condition worsens. Please contact her primary physician or the physician you were given for referral. If you have any specialist physicians involved in her treatment and plan please also contact them. Thank you for using [facility].

## 2015-07-23 NOTE — ED Provider Notes (Signed)
Time Seen: Approximately ----------------------------------------- 5:56 PM on 07/23/2015 -----------------------------------------   I have reviewed the triage notes  Chief Complaint: Abnormal Lab   History of Present Illness: Ariana [name] is a 81 y.o. female who has a history of ITP and has been having a platelet count followed by her primary physician and her hematologist. Patient's platelet count got down into the low 30s and her husband states that she was given "" a shot "" last week. She received half of a platelet transfusion today and is scheduled to go back for another transfusion on Monday. She had mentioned that she had some diffuse abdominal pain and noticed some dark stool and was referred here to the emergency department for further evaluation. She states that most of her abdominal pain seems to be in the epigastric area and then also points to appear in umbilical region. She denies any bright red blood or any other areas of bleeding. She has chronic purpura which she states is not new or has not been increasing recently. She denies any headaches, nausea, vomiting.   Past Medical History  Diagnosis Date  . Hypercholesteremia   . ITP (idiopathic thrombocytopenic purpura)   . Restless legs   . Anemia   . Depression   . Osteoarthritis   . GERD (gastroesophageal reflux disease)   . Hiatal hernia   . Chronic back pain   . Asthma   . Osteoporosis   . Depression   . Anxiety   . Pulmonary emboli [facility])     Patient Active Problem List   Diagnosis Date Noted  . Thrombocytopenia ([place]) 06/29/2015  . Clinical depression 04/12/2015  . Herpes zona 04/12/2015  . ITP (idiopathic thrombocytopenic purpura) 03/15/2015  . Chest pain 10/22/2014  . Breathlessness on exertion 10/22/2014  . Osteopenia 10/18/2014  . Benign essential HTN 06/28/2014  . HLD (hyperlipidemia) 06/28/2014  . Essential hemorrhagic thrombocythemia ([place]) 06/28/2014  . Acquired hypothyroidism  06/04/2014  . Anxiety and depression 06/04/2014  . Bursitis, trochanteric 03/27/2014  . DDD (degenerative disc disease), lumbar 02/27/2014  . Neuritis or radiculitis due to rupture of lumbar intervertebral disc 02/27/2014    Past Surgical History  Procedure Laterality Date  . Splenectomy, partial    . Total abdominal hysterectomy    . Lumbar disc surgery    . Carpal tunnel release      Past Surgical History  Procedure Laterality Date  . Splenectomy, partial    . Total abdominal hysterectomy    . Lumbar disc surgery    . Carpal tunnel release      Current Outpatient Rx  Name  Route  Sig  Dispense  Refill  . acetaminophen (TYLENOL) 325 MG tablet   Oral   Take 2 tablets (650 mg total) by mouth every 6 (six) hours as needed for mild pain (or Fever >/= 101).   100 tablet   0   . albuterol (PROVENTIL HFA;VENTOLIN HFA) 108 (90 BASE) MCG/ACT inhaler   Inhalation   Inhale 2 puffs into the lungs every 6 (six) hours as needed for wheezing or shortness of breath.         . BUDESONIDE-FORMOTEROL FUMARATE IN   Inhalation   Inhale 2 puffs into the lungs 2 (two) times daily as needed (for shortness of breath and wheezing.).          Marland Kitchen Calcium Carbonate-Vitamin D (CALCIUM 600+D) 600-400 MG-UNIT per tablet   Oral   Take 1 tablet by mouth daily.         Marland Kitchen  citalopram (CELEXA) 40 MG tablet   Oral   Take 40 mg by mouth daily.         . clotrimazole-betamethasone (LOTRISONE) cream   Topical   Apply 1 application topically daily as needed (for rash.).          Marland Kitchen Cyanocobalamin (RA VITAMIN B-12 TR) 1000 MCG TBCR   Oral   Take 1,000 mcg by mouth daily.          Marland Kitchen docusate sodium (COLACE) 100 MG capsule   Oral   Take 1 capsule (100 mg total) by mouth 2 (two) times daily as needed for mild constipation.   10 capsule   0   . donepezil (ARICEPT) 5 MG tablet   Oral   Take 5 mg by mouth at bedtime.          Marland Kitchen eltrombopag (PROMACTA) 50 MG tablet   Oral   Take 50 mg by  mouth daily. Take on an empty stomach 1 hour before a meal or 2 hours after         . gabapentin (NEURONTIN) 300 MG capsule      300mg  orally 4 times a day         . HYDROcodone-acetaminophen (NORCO/VICODIN) 5-325 MG per tablet      Take 1 tab by mouth every 4-6 hours as needed for pain         . levothyroxine (SYNTHROID, LEVOTHROID) 50 MCG tablet   Oral   Take 50 mcg by mouth daily before breakfast. Take 30 to 60 minutes before breakfast.         . Multiple Vitamins-Minerals (OCUVITE-LUTEIN PO)   Oral   Take 1 capsule by mouth daily.          Marland Kitchen omeprazole (PRILOSEC) 20 MG capsule   Oral   Take 20 mg by mouth 2 (two) times daily.         . ondansetron (ZOFRAN) 4 MG tablet   Oral   Take 1 tablet (4 mg total) by mouth every 6 (six) hours as needed for nausea.   20 tablet   0   . pravastatin (PRAVACHOL) 20 MG tablet   Oral   Take 20 mg by mouth daily.         Marland Kitchen senna (SENOKOT) 8.6 MG TABS tablet   Oral   Take 1 tablet (8.6 mg total) by mouth daily as needed for mild constipation.   120 each   0   . traMADol (ULTRAM) 50 MG tablet   Oral   Take 50 mg by mouth 3 (three) times daily as needed for moderate pain or severe pain. pain         . venlafaxine XR (EFFEXOR-XR) 37.5 MG 24 hr capsule   Oral   Take 1 capsule by mouth daily.          . vitamin E (E-400) 400 UNIT capsule   Oral   Take 400 Units by mouth daily.         Marland Kitchen zolpidem (AMBIEN) 5 MG tablet   Oral   Take 5 mg by mouth at bedtime as needed for sleep.           Allergies:  Aspirin; Diazepam; Morphine; Other; and Tetanus toxoids  Family History: Family History  Problem Relation Age of Onset  . Stroke Mother     Social History: Social History  Substance Use Topics  . Smoking status: Never Smoker   . Smokeless tobacco: Never Used  .  Alcohol Use: No     Review of Systems:   10 point review of systems was performed and was otherwise negative:  Constitutional: No  fever Eyes: No visual disturbances ENT: No sore throat, ear pain Cardiac: No chest pain Respiratory: No shortness of breath, wheezing, or stridor Abdomen: No abdominal pain, no vomiting, No diarrhea Endocrine: No weight loss, No night sweats Extremities: No peripheral edema, cyanosis Skin: No rashes, patient does have easy bruising Neurologic: No focal weakness, trouble with speech or swollowing Urologic: No dysuria, Hematuria, or urinary frequency   Physical Exam:  ED Triage Vitals  Enc Vitals Group     BP 07/23/15 1650 136/68 mmHg     Pulse Rate 07/23/15 1650 71     Resp 07/23/15 1650 18     Temp 07/23/15 1650 98.5 F (36.9 C)     Temp Source 07/23/15 1650 Oral     SpO2 07/23/15 1650 95 %     Weight 07/23/15 1650 137 lb (62.143 kg)     Height 07/23/15 1650 5\' 5"  (1.651 m)     Head Cir --      Peak Flow --      Pain Score 07/23/15 1649 6     Pain Loc --      Pain Edu? --      Excl. in [place]? --     General: Awake , Alert , and Oriented times 3; GCS 15 Head: Normal cephalic , atraumatic Eyes: Pupils equal , round, reactive to light Nose/Throat: No nasal drainage, patent upper airway without erythema or exudate.  Neck: Supple, Full range of motion, No anterior adenopathy or palpable thyroid masses Lungs: Clear to ascultation without wheezes , rhonchi, or rales Heart: Regular rate, regular rhythm without murmurs , gallops , or rubs Abdomen: Soft, non tender without rebound, guarding , or rigidity; bowel sounds positive and symmetric in all 4 quadrants. No organomegaly .        Extremities: 2 plus symmetric pulses. No edema, clubbing or cyanosis Neurologic: normal ambulation, Motor symmetric without deficits, sensory intact Skin: warm, dry, no rashes. Purpura are present Rectal exam with chaperone present showed normal sphincter tone slightly dark stool in the rectal vault no palpable masses, normal sphincter tone, is trace guaiac positive on guaiac card.   Labs:   All  laboratory work was reviewed including any pertinent negatives or positives listed below:  Labs Reviewed  LIPASE, BLOOD  COMPREHENSIVE METABOLIC PANEL  CBC  URINALYSIS COMPLETEWITH MICROSCOPIC ([place])  PROTIME-INR  TYPE AND SCREEN   ofthe patient's platelet levels appear to have improved. She also has some hypokalemia with a potassium level 3.0.   ED Course: Patient's stay here was uneventful. She was given some oral potassium. It appears that her hemoglobin is stable her platelet count is actually improving. Patient was given IV protonix. The patient's guaiac positive on her car but is not frank melena at this time. Given the options of patient once to try going home and I discussed her outpatient follow-up with her gastroenterologist. When goes on to state that she's had a swallowing study and apparently has an upcoming endoscopic exam scheduled. I advised them to contact a gastroenterologist and discuss her visit here to emergency department and we were concerned that she may have some gastritis. Patient be discharged on a prescription for protonic's and she was advised to have her potassium rechecked as an outpatient. I felt with the improvement in her platelets this will likely decrease any chance  of an acute upper gastrointestinal bleed. They're especially advised to return here if she develops more melena, becomes lightheaded, or any other new symptoms.    Assessment:  Stable upper gastrointestinal bleed History of ITP Hypokalemia     Plan:  Outpatient management Patient was advised to return immediately if condition worsens. Patient was advised to follow up with her primary care physician or other specialized physicians involved and in their current assessment.             Daymon Larsen, MD 07/23/15 2020

## 2015-07-23 NOTE — ED Notes (Signed)
AAOx3.  Skin warm and dry. Ambulates with easy and steady gait. 

## 2015-07-24 ENCOUNTER — Inpatient Hospital Stay: Payer: Commercial Managed Care - HMO

## 2015-07-24 VITALS — BP 144/60 | HR 58 | Temp 98.2°F | Resp 20

## 2015-07-24 DIAGNOSIS — Z79899 Other long term (current) drug therapy: Secondary | ICD-10-CM | POA: Diagnosis not present

## 2015-07-24 DIAGNOSIS — R531 Weakness: Secondary | ICD-10-CM | POA: Diagnosis not present

## 2015-07-24 DIAGNOSIS — R630 Anorexia: Secondary | ICD-10-CM | POA: Diagnosis not present

## 2015-07-24 DIAGNOSIS — D693 Immune thrombocytopenic purpura: Secondary | ICD-10-CM | POA: Diagnosis not present

## 2015-07-24 DIAGNOSIS — R197 Diarrhea, unspecified: Secondary | ICD-10-CM | POA: Diagnosis not present

## 2015-07-24 DIAGNOSIS — Z9081 Acquired absence of spleen: Secondary | ICD-10-CM | POA: Diagnosis not present

## 2015-07-24 DIAGNOSIS — R5383 Other fatigue: Secondary | ICD-10-CM | POA: Diagnosis not present

## 2015-07-24 MED ORDER — IMMUNE GLOBULIN (HUMAN) 10 GM/100ML IV SOLN
0.4000 g/kg | Freq: Once | INTRAVENOUS | Status: AC
  Administered 2015-07-24: 25 g via INTRAVENOUS
  Filled 2015-07-24: qty 250

## 2015-07-24 MED ORDER — DIPHENHYDRAMINE HCL 25 MG PO TABS
25.0000 mg | ORAL_TABLET | Freq: Every evening | ORAL | Status: AC | PRN
  Administered 2015-07-24: 25 mg via ORAL
  Filled 2015-07-24: qty 1

## 2015-07-24 MED ORDER — SODIUM CHLORIDE 0.9 % IV SOLN
Freq: Once | INTRAVENOUS | Status: AC
  Administered 2015-07-24: 09:00:00 via INTRAVENOUS
  Filled 2015-07-24: qty 1000

## 2015-07-24 MED ORDER — ACETAMINOPHEN 325 MG PO TABS
650.0000 mg | ORAL_TABLET | Freq: Four times a day (QID) | ORAL | Status: DC | PRN
  Administered 2015-07-24: 650 mg via ORAL
  Filled 2015-07-24: qty 2

## 2015-07-24 NOTE — Progress Notes (Signed)
[facility] OFFICE PROGRESS NOTE  Patient Care Team: Idelle Crouch, MD as PCP - General (Internal Medicine)  HPI  SUMMARY of HEMATOLOGIC HISTORY:  #  ITP s/p Splenectomy [2007]; POOR RESPONSE to Prednisone; WinRho; Rituxan; RESPONSIVE to IVIG  # OCT 2015- START N-PLATE- LABILE RESPONSE  # SEP END 2016- START PROMACTA 50mg /d   INTERVAL HISTORY:   A 81 year old female patient with above history of chronic refractory ITP currently on N-plate who returns to clinic today for repeat laboratory work and further evaluation. She most recently had endplate this past Friday 3 days ago. She continues to have increased weakness and fatigue, but otherwise feels well. She denies any bleeding, but continues to have easy bruising. She denies any fevers. She has no chest pain or shortness of breath. She denies any nausea, vomiting, constipation, or diarrhea. She does not have any melena or hematochezia. Patient otherwise feels well and offers no further specific complaints.   REVIEW OF SYSTEMS:  10 point review of systems was done with the patient and are negative.  I have reviewed the past medical history, past surgical history, social history and family history with the patient and they are unchanged from previous note unless stated above.  ALLERGIES:  is allergic to aspirin; diazepam; morphine; other; and tetanus toxoids.  MEDICATIONS:  Current Outpatient Prescriptions  Medication Sig Dispense Refill  . acetaminophen (TYLENOL) 325 MG tablet Take 2 tablets (650 mg total) by mouth every 6 (six) hours as needed for mild pain (or Fever >/= 101). 100 tablet 0  . albuterol (PROVENTIL HFA;VENTOLIN HFA) 108 (90 BASE) MCG/ACT inhaler Inhale 2 puffs into the lungs every 6 (six) hours as needed for wheezing or shortness of breath.    . BUDESONIDE-FORMOTEROL FUMARATE IN Inhale 2 puffs into the lungs 2 (two) times daily as needed (for shortness of breath and wheezing.).     Marland Kitchen Calcium  Carbonate-Vitamin D (CALCIUM 600+D) 600-400 MG-UNIT per tablet Take 1 tablet by mouth daily.    . citalopram (CELEXA) 40 MG tablet Take 40 mg by mouth daily.    . clotrimazole-betamethasone (LOTRISONE) cream Apply 1 application topically daily as needed (for rash.).     Marland Kitchen Cyanocobalamin (RA VITAMIN B-12 TR) 1000 MCG TBCR Take 1,000 mcg by mouth daily.     Marland Kitchen docusate sodium (COLACE) 100 MG capsule Take 1 capsule (100 mg total) by mouth 2 (two) times daily as needed for mild constipation. 10 capsule 0  . donepezil (ARICEPT) 5 MG tablet Take 5 mg by mouth at bedtime.     Marland Kitchen eltrombopag (PROMACTA) 50 MG tablet Take 50 mg by mouth daily. Take on an empty stomach 1 hour before a meal or 2 hours after    . gabapentin (NEURONTIN) 300 MG capsule 300mg  orally 4 times a day    . HYDROcodone-acetaminophen (NORCO/VICODIN) 5-325 MG per tablet Take 1 tab by mouth every 4-6 hours as needed for pain    . levothyroxine (SYNTHROID, LEVOTHROID) 50 MCG tablet Take 50 mcg by mouth daily before breakfast. Take 30 to 60 minutes before breakfast.    . Multiple Vitamins-Minerals (OCUVITE-LUTEIN PO) Take 1 capsule by mouth daily.     . ondansetron (ZOFRAN) 4 MG tablet Take 1 tablet (4 mg total) by mouth every 6 (six) hours as needed for nausea. 20 tablet 0  . pravastatin (PRAVACHOL) 20 MG tablet Take 20 mg by mouth daily.    Marland Kitchen senna (SENOKOT) 8.6 MG TABS tablet Take 1 tablet (8.6 mg  total) by mouth daily as needed for mild constipation. 120 each 0  . traMADol (ULTRAM) 50 MG tablet Take 50 mg by mouth 3 (three) times daily as needed for moderate pain or severe pain. pain    . venlafaxine XR (EFFEXOR-XR) 37.5 MG 24 hr capsule Take 1 capsule by mouth daily.     . vitamin E (E-400) 400 UNIT capsule Take 400 Units by mouth daily.    Marland Kitchen zolpidem (AMBIEN) 5 MG tablet Take 5 mg by mouth at bedtime as needed for sleep.    . pantoprazole (PROTONIX) 20 MG tablet Take 1 tablet (20 mg total) by mouth daily. 30 tablet 1   No current  facility-administered medications for this visit.    PHYSICAL EXAMINATION:   BP 144/80 mmHg  Pulse 66  Temp(Src) 97.1 F (36.2 C) (Oral)  Resp 16  Wt 137 lb 9.1 oz (62.401 kg)  Filed Weights   07/22/15 1401  Weight: 137 lb 9.1 oz (62.401 kg)    GENERAL:alert, no distress and comfortable; patient is accompanied by her husband. EYES: normal, Conjunctiva are pink and non-injected, sclera clear OROPHARYNX: No bleeding noted  LUNGS: clear to auscultation with normal breathing effort; No Wheeze or crackles  Cardio-vascular- Regular Rate & Rythm and no murmurs and no lower extremity edema ABDOMEN:abdomen soft, non-tender and normal bowel sounds; No hepato-splenomegaly.  Musculoskeletal:no cyanosis of digits and no clubbing  NEURO: alert & oriented x 3 with fluent speech, no focal motor/sensory deficits. SKIN: Multiple ecchymosis noted on upper and lower extremities.  LABORATORY DATA:  I have reviewed the data as listed    Component Value Date/Time   NA 135 07/23/2015 1719   NA 143 06/13/2014 1428   K 3.0* 07/23/2015 1719   K 4.2 06/13/2014 1428   CL 107 07/23/2015 1719   CL 105 06/13/2014 1428   CO2 25 07/23/2015 1719   CO2 31 06/13/2014 1428   GLUCOSE 90 07/23/2015 1719   GLUCOSE 70 06/13/2014 1428   BUN 12 07/23/2015 1719   BUN 15 06/13/2014 1428   CREATININE 0.84 07/23/2015 1719   CREATININE 1.05 06/13/2014 1428   CALCIUM 8.8* 07/23/2015 1719   CALCIUM 8.5 06/13/2014 1428   PROT 7.3 07/23/2015 1719   PROT 6.5 06/01/2014 1109   ALBUMIN 3.5 07/23/2015 1719   ALBUMIN 3.4 06/01/2014 1109   AST 29 07/23/2015 1719   AST 31 06/01/2014 1109   ALT 20 07/23/2015 1719   ALT 76* 06/01/2014 1109   ALKPHOS 70 07/23/2015 1719   ALKPHOS 93 06/01/2014 1109   BILITOT 0.8 07/23/2015 1719   BILITOT 0.3 06/01/2014 1109   GFRNONAA >60 07/23/2015 1719   GFRNONAA 51* 06/13/2014 1428   GFRAA >60 07/23/2015 1719   GFRAA 59* 06/13/2014 1428    No results found for: SPEP,  UPEP  Lab Results  Component Value Date   WBC 9.2 07/23/2015   NEUTROABS 7.6* 07/22/2015   HGB 13.0 07/23/2015   HCT 39.1 07/23/2015   MCV 92.5 07/23/2015   PLT 70* 07/23/2015      Chemistry      Component Value Date/Time   NA 135 07/23/2015 1719   NA 143 06/13/2014 1428   K 3.0* 07/23/2015 1719   K 4.2 06/13/2014 1428   CL 107 07/23/2015 1719   CL 105 06/13/2014 1428   CO2 25 07/23/2015 1719   CO2 31 06/13/2014 1428   BUN 12 07/23/2015 1719   BUN 15 06/13/2014 1428   CREATININE 0.84 07/23/2015 1719  CREATININE 1.05 06/13/2014 1428      Component Value Date/Time   CALCIUM 8.8* 07/23/2015 1719   CALCIUM 8.5 06/13/2014 1428   ALKPHOS 70 07/23/2015 1719   ALKPHOS 93 06/01/2014 1109   AST 29 07/23/2015 1719   AST 31 06/01/2014 1109   ALT 20 07/23/2015 1719   ALT 76* 06/01/2014 1109   BILITOT 0.8 07/23/2015 1719   BILITOT 0.3 06/01/2014 1109       RADIOGRAPHIC STUDIES: I have personally reviewed the radiological images as listed and agreed with the findings in the report. No results found.   ASSESSMENT & PLAN:  Chronic refractory ITP currently on N-plate since October 7425 with very labile response. Patient's platelets are 35 which is decreased from Friday when she received Nplate. She can only receive Nplate every 7 days and there is a concern that her platelets will continue to decline. After lengthy discussion with the patient, she will return to clinic tomorrow and Wednesday to receive 400 mg/kg of IVIG. She will then return to clinic on Friday with repeat laboratory work at which time she may be able to receive Nplate if necessary. Finally, she will then have further follow-up on July 29, 2015. Previously, Promacta was discussed and has been ordered. Patient will initiate this treatment in the next 1-2 weeks to assess if there is an improved more stable response.   Orders Placed This Encounter  Procedures  . Creatinine, serum    Standing Status: Future      Number of Occurrences:      Standing Expiration Date: 07/21/2016   Patient understands she can return to clinic at any time if she has any questions, concerns, or complaints.  Approximately 30 minutes was spent in discussion and consultation.     Lloyd Huger, MD 07/24/2015 8:47 AM

## 2015-07-25 ENCOUNTER — Ambulatory Visit: Payer: Commercial Managed Care - HMO

## 2015-07-25 ENCOUNTER — Other Ambulatory Visit: Payer: Commercial Managed Care - HMO

## 2015-07-25 DIAGNOSIS — F0390 Unspecified dementia without behavioral disturbance: Secondary | ICD-10-CM | POA: Diagnosis not present

## 2015-07-25 DIAGNOSIS — F329 Major depressive disorder, single episode, unspecified: Secondary | ICD-10-CM | POA: Diagnosis not present

## 2015-07-25 DIAGNOSIS — M150 Primary generalized (osteo)arthritis: Secondary | ICD-10-CM | POA: Diagnosis not present

## 2015-07-25 DIAGNOSIS — J45909 Unspecified asthma, uncomplicated: Secondary | ICD-10-CM | POA: Diagnosis not present

## 2015-07-25 DIAGNOSIS — D693 Immune thrombocytopenic purpura: Secondary | ICD-10-CM | POA: Diagnosis not present

## 2015-07-25 DIAGNOSIS — D649 Anemia, unspecified: Secondary | ICD-10-CM | POA: Diagnosis not present

## 2015-07-25 DIAGNOSIS — K219 Gastro-esophageal reflux disease without esophagitis: Secondary | ICD-10-CM | POA: Diagnosis not present

## 2015-07-25 DIAGNOSIS — F419 Anxiety disorder, unspecified: Secondary | ICD-10-CM | POA: Diagnosis not present

## 2015-07-25 DIAGNOSIS — M810 Age-related osteoporosis without current pathological fracture: Secondary | ICD-10-CM | POA: Diagnosis not present

## 2015-07-26 ENCOUNTER — Inpatient Hospital Stay: Payer: Commercial Managed Care - HMO

## 2015-07-26 ENCOUNTER — Ambulatory Visit: Payer: Commercial Managed Care - HMO

## 2015-07-26 DIAGNOSIS — R531 Weakness: Secondary | ICD-10-CM | POA: Diagnosis not present

## 2015-07-26 DIAGNOSIS — Z79899 Other long term (current) drug therapy: Secondary | ICD-10-CM | POA: Diagnosis not present

## 2015-07-26 DIAGNOSIS — R1010 Upper abdominal pain, unspecified: Secondary | ICD-10-CM | POA: Diagnosis not present

## 2015-07-26 DIAGNOSIS — Z9081 Acquired absence of spleen: Secondary | ICD-10-CM | POA: Diagnosis not present

## 2015-07-26 DIAGNOSIS — D693 Immune thrombocytopenic purpura: Secondary | ICD-10-CM

## 2015-07-26 DIAGNOSIS — R197 Diarrhea, unspecified: Secondary | ICD-10-CM | POA: Diagnosis not present

## 2015-07-26 DIAGNOSIS — R630 Anorexia: Secondary | ICD-10-CM | POA: Diagnosis not present

## 2015-07-26 DIAGNOSIS — R195 Other fecal abnormalities: Secondary | ICD-10-CM | POA: Diagnosis not present

## 2015-07-26 DIAGNOSIS — R5383 Other fatigue: Secondary | ICD-10-CM | POA: Diagnosis not present

## 2015-07-26 DIAGNOSIS — K529 Noninfective gastroenteritis and colitis, unspecified: Secondary | ICD-10-CM | POA: Diagnosis not present

## 2015-07-26 LAB — CBC WITH DIFFERENTIAL/PLATELET
BASOS PCT: 1 %
Basophils Absolute: 0.1 10*3/uL (ref 0–0.1)
EOS ABS: 0 10*3/uL (ref 0–0.7)
Eosinophils Relative: 1 %
HEMATOCRIT: 39.6 % (ref 35.0–47.0)
HEMOGLOBIN: 13.1 g/dL (ref 12.0–16.0)
LYMPHS ABS: 2.4 10*3/uL (ref 1.0–3.6)
Lymphocytes Relative: 26 %
MCH: 30.2 pg (ref 26.0–34.0)
MCHC: 33.1 g/dL (ref 32.0–36.0)
MCV: 91.2 fL (ref 80.0–100.0)
MONOS PCT: 8 %
Monocytes Absolute: 0.7 10*3/uL (ref 0.2–0.9)
NEUTROS ABS: 6 10*3/uL (ref 1.4–6.5)
NEUTROS PCT: 64 %
Platelets: 226 10*3/uL (ref 150–440)
RBC: 4.35 MIL/uL (ref 3.80–5.20)
RDW: 16.5 % — ABNORMAL HIGH (ref 11.5–14.5)
WBC: 9.3 10*3/uL (ref 3.6–11.0)

## 2015-07-26 LAB — CREATININE, SERUM
CREATININE: 0.81 mg/dL (ref 0.44–1.00)
GFR calc Af Amer: >60 mL/min (ref >60)

## 2015-07-26 LAB — HEPATIC FUNCTION PANEL
ALBUMIN: 3.8 g/dL (ref 3.5–5.0)
ALT: 30 U/L (ref 14–54)
AST: 38 U/L (ref 15–41)
Alkaline Phosphatase: 68 U/L (ref 38–126)
BILIRUBIN TOTAL: 0.6 mg/dL (ref 0.3–1.2)
Bilirubin, Direct: <0.1 mg/dL — ABNORMAL LOW (ref 0.1–0.5)
Total Protein: 8.1 g/dL (ref 6.5–8.1)

## 2015-07-29 ENCOUNTER — Inpatient Hospital Stay: Payer: Commercial Managed Care - HMO

## 2015-07-29 ENCOUNTER — Inpatient Hospital Stay (HOSPITAL_BASED_OUTPATIENT_CLINIC_OR_DEPARTMENT_OTHER): Payer: Commercial Managed Care - HMO | Admitting: Oncology

## 2015-07-29 VITALS — BP 156/75 | HR 67 | Temp 95.6°F | Resp 18 | Wt 140.0 lb

## 2015-07-29 DIAGNOSIS — R531 Weakness: Secondary | ICD-10-CM | POA: Diagnosis not present

## 2015-07-29 DIAGNOSIS — Z79899 Other long term (current) drug therapy: Secondary | ICD-10-CM | POA: Diagnosis not present

## 2015-07-29 DIAGNOSIS — R630 Anorexia: Secondary | ICD-10-CM | POA: Diagnosis not present

## 2015-07-29 DIAGNOSIS — R197 Diarrhea, unspecified: Secondary | ICD-10-CM | POA: Diagnosis not present

## 2015-07-29 DIAGNOSIS — Z9081 Acquired absence of spleen: Secondary | ICD-10-CM

## 2015-07-29 DIAGNOSIS — D693 Immune thrombocytopenic purpura: Secondary | ICD-10-CM

## 2015-07-29 DIAGNOSIS — R5383 Other fatigue: Secondary | ICD-10-CM | POA: Diagnosis not present

## 2015-07-29 LAB — CBC WITH DIFFERENTIAL/PLATELET
BASOS PCT: 1 %
Basophils Absolute: 0.1 10*3/uL (ref 0–0.1)
EOS ABS: 0.1 10*3/uL (ref 0–0.7)
EOS PCT: 1 %
HCT: 40.1 % (ref 35.0–47.0)
Hemoglobin: 13.1 g/dL (ref 12.0–16.0)
LYMPHS ABS: 2.6 10*3/uL (ref 1.0–3.6)
Lymphocytes Relative: 27 %
MCH: 30.3 pg (ref 26.0–34.0)
MCHC: 32.6 g/dL (ref 32.0–36.0)
MCV: 92.9 fL (ref 80.0–100.0)
MONO ABS: 0.8 10*3/uL (ref 0.2–0.9)
MONOS PCT: 8 %
Neutro Abs: 5.9 10*3/uL (ref 1.4–6.5)
Neutrophils Relative %: 63 %
Platelets: 253 10*3/uL (ref 150–440)
RBC: 4.32 MIL/uL (ref 3.80–5.20)
RDW: 16.4 % — AB (ref 11.5–14.5)
WBC: 9.4 10*3/uL (ref 3.6–11.0)

## 2015-07-29 NOTE — Progress Notes (Signed)
Patient still feels fatigued.

## 2015-07-30 DIAGNOSIS — K219 Gastro-esophageal reflux disease without esophagitis: Secondary | ICD-10-CM | POA: Diagnosis not present

## 2015-07-30 DIAGNOSIS — J45909 Unspecified asthma, uncomplicated: Secondary | ICD-10-CM | POA: Diagnosis not present

## 2015-07-30 DIAGNOSIS — M150 Primary generalized (osteo)arthritis: Secondary | ICD-10-CM | POA: Diagnosis not present

## 2015-07-30 DIAGNOSIS — D649 Anemia, unspecified: Secondary | ICD-10-CM | POA: Diagnosis not present

## 2015-07-30 DIAGNOSIS — D693 Immune thrombocytopenic purpura: Secondary | ICD-10-CM | POA: Diagnosis not present

## 2015-07-30 DIAGNOSIS — F419 Anxiety disorder, unspecified: Secondary | ICD-10-CM | POA: Diagnosis not present

## 2015-07-30 DIAGNOSIS — M810 Age-related osteoporosis without current pathological fracture: Secondary | ICD-10-CM | POA: Diagnosis not present

## 2015-07-30 DIAGNOSIS — F329 Major depressive disorder, single episode, unspecified: Secondary | ICD-10-CM | POA: Diagnosis not present

## 2015-07-30 DIAGNOSIS — F0390 Unspecified dementia without behavioral disturbance: Secondary | ICD-10-CM | POA: Diagnosis not present

## 2015-08-01 ENCOUNTER — Inpatient Hospital Stay: Payer: Commercial Managed Care - HMO

## 2015-08-01 DIAGNOSIS — R630 Anorexia: Secondary | ICD-10-CM | POA: Diagnosis not present

## 2015-08-01 DIAGNOSIS — Z9081 Acquired absence of spleen: Secondary | ICD-10-CM | POA: Diagnosis not present

## 2015-08-01 DIAGNOSIS — D693 Immune thrombocytopenic purpura: Secondary | ICD-10-CM | POA: Diagnosis not present

## 2015-08-01 DIAGNOSIS — R531 Weakness: Secondary | ICD-10-CM | POA: Diagnosis not present

## 2015-08-01 DIAGNOSIS — Z79899 Other long term (current) drug therapy: Secondary | ICD-10-CM | POA: Diagnosis not present

## 2015-08-01 DIAGNOSIS — R197 Diarrhea, unspecified: Secondary | ICD-10-CM | POA: Diagnosis not present

## 2015-08-01 DIAGNOSIS — K529 Noninfective gastroenteritis and colitis, unspecified: Secondary | ICD-10-CM | POA: Diagnosis not present

## 2015-08-01 DIAGNOSIS — R5383 Other fatigue: Secondary | ICD-10-CM | POA: Diagnosis not present

## 2015-08-01 LAB — CBC WITH DIFFERENTIAL/PLATELET
Basophils Absolute: 0.1 10*3/uL (ref 0–0.1)
EOS ABS: 0.4 10*3/uL (ref 0–0.7)
HCT: 39.1 % (ref 35.0–47.0)
Hemoglobin: 12.6 g/dL (ref 12.0–16.0)
LYMPHS ABS: 2.8 10*3/uL (ref 1.0–3.6)
Lymphocytes Relative: 24 %
MCH: 30.2 pg (ref 26.0–34.0)
MCHC: 32.2 g/dL (ref 32.0–36.0)
MCV: 93.9 fL (ref 80.0–100.0)
MONO ABS: 0.7 10*3/uL (ref 0.2–0.9)
Neutro Abs: 7.5 10*3/uL — ABNORMAL HIGH (ref 1.4–6.5)
PLATELETS: 150 10*3/uL (ref 150–400)
RBC: 4.16 MIL/uL (ref 3.80–5.20)
RDW: 16.9 % — AB (ref 11.5–14.5)
WBC: 11.5 10*3/uL — ABNORMAL HIGH (ref 3.6–11.0)

## 2015-08-02 ENCOUNTER — Other Ambulatory Visit: Payer: Commercial Managed Care - HMO

## 2015-08-02 ENCOUNTER — Ambulatory Visit: Payer: Commercial Managed Care - HMO

## 2015-08-02 ENCOUNTER — Ambulatory Visit: Payer: Commercial Managed Care - HMO | Admitting: Internal Medicine

## 2015-08-06 ENCOUNTER — Inpatient Hospital Stay: Payer: Commercial Managed Care - HMO | Admitting: *Deleted

## 2015-08-06 DIAGNOSIS — D693 Immune thrombocytopenic purpura: Secondary | ICD-10-CM

## 2015-08-06 DIAGNOSIS — R197 Diarrhea, unspecified: Secondary | ICD-10-CM | POA: Diagnosis not present

## 2015-08-06 DIAGNOSIS — Z79899 Other long term (current) drug therapy: Secondary | ICD-10-CM | POA: Diagnosis not present

## 2015-08-06 DIAGNOSIS — R5383 Other fatigue: Secondary | ICD-10-CM | POA: Diagnosis not present

## 2015-08-06 DIAGNOSIS — R531 Weakness: Secondary | ICD-10-CM | POA: Diagnosis not present

## 2015-08-06 DIAGNOSIS — R630 Anorexia: Secondary | ICD-10-CM | POA: Diagnosis not present

## 2015-08-06 DIAGNOSIS — Z9081 Acquired absence of spleen: Secondary | ICD-10-CM | POA: Diagnosis not present

## 2015-08-06 LAB — CBC WITH DIFFERENTIAL/PLATELET
Basophils Absolute: 0.1 10*3/uL (ref 0–0.1)
Basophils Relative: 1 %
EOS ABS: 0.2 10*3/uL (ref 0–0.7)
EOS PCT: 2 %
HCT: 37.8 % (ref 35.0–47.0)
Hemoglobin: 12.3 g/dL (ref 12.0–16.0)
LYMPHS ABS: 2.7 10*3/uL (ref 1.0–3.6)
LYMPHS PCT: 31 %
MCH: 30 pg (ref 26.0–34.0)
MCHC: 32.6 g/dL (ref 32.0–36.0)
MCV: 92.2 fL (ref 80.0–100.0)
MONOS PCT: 8 %
Monocytes Absolute: 0.7 10*3/uL (ref 0.2–0.9)
Neutro Abs: 5 10*3/uL (ref 1.4–6.5)
Neutrophils Relative %: 58 %
PLATELETS: 67 10*3/uL — AB (ref 150–440)
RBC: 4.1 MIL/uL (ref 3.80–5.20)
RDW: 15.8 % — ABNORMAL HIGH (ref 11.5–14.5)
WBC: 8.7 10*3/uL (ref 3.6–11.0)

## 2015-08-07 ENCOUNTER — Telehealth: Payer: Self-pay | Admitting: *Deleted

## 2015-08-07 NOTE — Telephone Encounter (Signed)
Patients platelet count 67, patient notified. Patient to come in for lab only appointment on 08/08/15.

## 2015-08-08 ENCOUNTER — Telehealth: Payer: Self-pay

## 2015-08-08 ENCOUNTER — Inpatient Hospital Stay: Payer: Commercial Managed Care - HMO

## 2015-08-08 DIAGNOSIS — R630 Anorexia: Secondary | ICD-10-CM | POA: Diagnosis not present

## 2015-08-08 DIAGNOSIS — R5383 Other fatigue: Secondary | ICD-10-CM | POA: Diagnosis not present

## 2015-08-08 DIAGNOSIS — D693 Immune thrombocytopenic purpura: Secondary | ICD-10-CM

## 2015-08-08 DIAGNOSIS — Z79899 Other long term (current) drug therapy: Secondary | ICD-10-CM | POA: Diagnosis not present

## 2015-08-08 DIAGNOSIS — R197 Diarrhea, unspecified: Secondary | ICD-10-CM | POA: Diagnosis not present

## 2015-08-08 DIAGNOSIS — Z9081 Acquired absence of spleen: Secondary | ICD-10-CM | POA: Diagnosis not present

## 2015-08-08 DIAGNOSIS — R531 Weakness: Secondary | ICD-10-CM | POA: Diagnosis not present

## 2015-08-08 LAB — CBC WITH DIFFERENTIAL/PLATELET
BASOS ABS: 0.1 10*3/uL (ref 0–0.1)
Basophils Relative: 1 %
EOS PCT: 2 %
Eosinophils Absolute: 0.2 10*3/uL (ref 0–0.7)
HEMATOCRIT: 39.3 % (ref 35.0–47.0)
Hemoglobin: 12.8 g/dL (ref 12.0–16.0)
LYMPHS PCT: 35 %
Lymphs Abs: 2.7 10*3/uL (ref 1.0–3.6)
MCH: 30.2 pg (ref 26.0–34.0)
MCHC: 32.6 g/dL (ref 32.0–36.0)
MCV: 92.6 fL (ref 80.0–100.0)
MONO ABS: 0.6 10*3/uL (ref 0.2–0.9)
MONOS PCT: 8 %
Neutro Abs: 4.3 10*3/uL (ref 1.4–6.5)
Neutrophils Relative %: 54 %
PLATELETS: 55 10*3/uL — AB (ref 150–440)
RBC: 4.25 MIL/uL (ref 3.80–5.20)
RDW: 16.3 % — AB (ref 11.5–14.5)
WBC: 7.9 10*3/uL (ref 3.6–11.0)

## 2015-08-08 NOTE — Telephone Encounter (Signed)
Patient's platelet count keeps dropping and she is asking what to do now?

## 2015-08-09 ENCOUNTER — Telehealth: Payer: Self-pay | Admitting: *Deleted

## 2015-08-09 DIAGNOSIS — D473 Essential (hemorrhagic) thrombocythemia: Secondary | ICD-10-CM

## 2015-08-09 DIAGNOSIS — D693 Immune thrombocytopenic purpura: Secondary | ICD-10-CM

## 2015-08-09 NOTE — Telephone Encounter (Signed)
Asking about results of plt when told 37, she states she can not wait until 11/3. Sfter discussing with Dr Grayland Ormond, she was offered ot come back in Monday for lab check adn she has agreed to 915 appt

## 2015-08-11 NOTE — Progress Notes (Signed)
[facility] OFFICE PROGRESS NOTE  Patient Care Team: Idelle Crouch, MD as PCP - General (Internal Medicine)  HPI  SUMMARY of HEMATOLOGIC HISTORY:  #  ITP s/p Splenectomy [2007]; POOR RESPONSE to Prednisone; WinRho; Rituxan; RESPONSIVE to IVIG  # OCT 2015- START N-PLATE- LABILE RESPONSE  # SEP END 2016- START PROMACTA 50mg /d   INTERVAL HISTORY:   Patient returns to clinic today for further evaluation and repeat laboratory work. She tolerated her IVIG last week without significant side effects. She currently feels well and is asymptomatic. She does not complain of weakness or fatigue today. She does not complain of easy bleeding or bruising today. She denies any fevers. She has no chest pain or shortness of breath. She denies any nausea, vomiting, constipation, or diarrhea. She does not have any melena or hematochezia. Patient offers no specific complaints today.   REVIEW OF SYSTEMS:  10 point review of systems was done with the patient and are negative.  I have reviewed the past medical history, past surgical history, social history and family history with the patient and they are unchanged from previous note unless stated above.  ALLERGIES:  is allergic to aspirin; diazepam; morphine; other; and tetanus toxoids.  MEDICATIONS:  Current Outpatient Prescriptions  Medication Sig Dispense Refill  . acetaminophen (TYLENOL) 325 MG tablet Take 2 tablets (650 mg total) by mouth every 6 (six) hours as needed for mild pain (or Fever >/= 101). 100 tablet 0  . albuterol (PROVENTIL HFA;VENTOLIN HFA) 108 (90 BASE) MCG/ACT inhaler Inhale 2 puffs into the lungs every 6 (six) hours as needed for wheezing or shortness of breath.    . BUDESONIDE-FORMOTEROL FUMARATE IN Inhale 2 puffs into the lungs 2 (two) times daily as needed (for shortness of breath and wheezing.).     Marland Kitchen Calcium Carbonate-Vitamin D (CALCIUM 600+D) 600-400 MG-UNIT per tablet Take 1 tablet by mouth daily.    .  citalopram (CELEXA) 40 MG tablet Take 40 mg by mouth daily.    . clotrimazole-betamethasone (LOTRISONE) cream Apply 1 application topically daily as needed (for rash.).     Marland Kitchen Cyanocobalamin (RA VITAMIN B-12 TR) 1000 MCG TBCR Take 1,000 mcg by mouth daily.     Marland Kitchen docusate sodium (COLACE) 100 MG capsule Take 1 capsule (100 mg total) by mouth 2 (two) times daily as needed for mild constipation. 10 capsule 0  . donepezil (ARICEPT) 5 MG tablet Take 5 mg by mouth at bedtime.     . gabapentin (NEURONTIN) 300 MG capsule 300mg  orally 4 times a day    . HYDROcodone-acetaminophen (NORCO/VICODIN) 5-325 MG per tablet Take 1 tab by mouth every 4-6 hours as needed for pain    . levothyroxine (SYNTHROID, LEVOTHROID) 50 MCG tablet Take 50 mcg by mouth daily before breakfast. Take 30 to 60 minutes before breakfast.    . Multiple Vitamins-Minerals (OCUVITE-LUTEIN PO) Take 1 capsule by mouth daily.     . ondansetron (ZOFRAN) 4 MG tablet Take 1 tablet (4 mg total) by mouth every 6 (six) hours as needed for nausea. 20 tablet 0  . pantoprazole (PROTONIX) 20 MG tablet Take by mouth.    . pravastatin (PRAVACHOL) 20 MG tablet Take 20 mg by mouth daily.    Marland Kitchen senna (SENOKOT) 8.6 MG TABS tablet Take 1 tablet (8.6 mg total) by mouth daily as needed for mild constipation. 120 each 0  . traMADol (ULTRAM) 50 MG tablet Take 50 mg by mouth 3 (three) times daily as needed for moderate  pain or severe pain. pain    . venlafaxine XR (EFFEXOR-XR) 37.5 MG 24 hr capsule Take 1 capsule by mouth daily.     . vitamin E (E-400) 400 UNIT capsule Take 400 Units by mouth daily.    Marland Kitchen zolpidem (AMBIEN) 5 MG tablet Take 5 mg by mouth at bedtime as needed for sleep.    Marland Kitchen eltrombopag (PROMACTA) 50 MG tablet Take 50 mg by mouth daily. Take on an empty stomach 1 hour before a meal or 2 hours after     No current facility-administered medications for this visit.    PHYSICAL EXAMINATION:   BP 156/75 mmHg  Pulse 67  Temp(Src) 95.6 F (35.3 C)  (Tympanic)  Resp 18  Wt 139 lb 15.9 oz (63.5 kg)  Filed Weights   07/29/15 1224  Weight: 139 lb 15.9 oz (63.5 kg)    General: Well-developed, well-nourished, no acute distress. Eyes: Pink conjunctiva, anicteric sclera. Lungs: Clear to auscultation bilaterally. Heart: Regular rate and rhythm. No rubs, murmurs, or gallops. Abdomen: Soft, nontender, nondistended. No organomegaly noted, normoactive bowel sounds. Musculoskeletal: No edema, cyanosis, or clubbing. Neuro: Alert, answering all questions appropriately. Cranial nerves grossly intact. Skin: Several ecchymosis in various degrees of healing noted.  Psych: Normal affect.  LABORATORY DATA:  I have reviewed the data as listed    Component Value Date/Time   NA 135 07/23/2015 1719   NA 143 06/13/2014 1428   K 3.0* 07/23/2015 1719   K 4.2 06/13/2014 1428   CL 107 07/23/2015 1719   CL 105 06/13/2014 1428   CO2 25 07/23/2015 1719   CO2 31 06/13/2014 1428   GLUCOSE 90 07/23/2015 1719   GLUCOSE 70 06/13/2014 1428   BUN 12 07/23/2015 1719   BUN 15 06/13/2014 1428   CREATININE 0.81 07/26/2015 1053   CREATININE 1.05 06/13/2014 1428   CALCIUM 8.8* 07/23/2015 1719   CALCIUM 8.5 06/13/2014 1428   PROT 8.1 07/26/2015 1053   PROT 6.5 06/01/2014 1109   ALBUMIN 3.8 07/26/2015 1053   ALBUMIN 3.4 06/01/2014 1109   AST 38 07/26/2015 1053   AST 31 06/01/2014 1109   ALT 30 07/26/2015 1053   ALT 76* 06/01/2014 1109   ALKPHOS 68 07/26/2015 1053   ALKPHOS 93 06/01/2014 1109   BILITOT 0.6 07/26/2015 1053   BILITOT 0.3 06/01/2014 1109   GFRNONAA >60 07/26/2015 1053   GFRNONAA 51* 06/13/2014 1428   GFRAA >60 07/26/2015 1053   GFRAA 59* 06/13/2014 1428    No results found for: SPEP, UPEP  Lab Results  Component Value Date   WBC 7.9 08/08/2015   NEUTROABS 4.3 08/08/2015   HGB 12.8 08/08/2015   HCT 39.3 08/08/2015   MCV 92.6 08/08/2015   PLT 55* 08/08/2015      Chemistry      Component Value Date/Time   NA 135 07/23/2015 1719    NA 143 06/13/2014 1428   K 3.0* 07/23/2015 1719   K 4.2 06/13/2014 1428   CL 107 07/23/2015 1719   CL 105 06/13/2014 1428   CO2 25 07/23/2015 1719   CO2 31 06/13/2014 1428   BUN 12 07/23/2015 1719   BUN 15 06/13/2014 1428   CREATININE 0.81 07/26/2015 1053   CREATININE 1.05 06/13/2014 1428      Component Value Date/Time   CALCIUM 8.8* 07/23/2015 1719   CALCIUM 8.5 06/13/2014 1428   ALKPHOS 68 07/26/2015 1053   ALKPHOS 93 06/01/2014 1109   AST 38 07/26/2015 1053   AST 31 06/01/2014  1109   ALT 30 07/26/2015 1053   ALT 76* 06/01/2014 1109   BILITOT 0.6 07/26/2015 1053   BILITOT 0.3 06/01/2014 1109       RADIOGRAPHIC STUDIES: I have personally reviewed the radiological images as listed and agreed with the findings in the report. No results found.   ASSESSMENT & PLAN:  Chronic refractory ITP currently on N-plate since October 6734 with very labile response. Patient's platelets are  now within normal limits after receiving 2 infusions of IVIG at 0.4 g/kg per day last week. She does not require any further treatment at this time. Return to clinic on December 20 as well as December 27 for lab only and then again on August 15, 2015 for laboratory work, further evaluation, and consideration of additional IVIG. Previously, Promacta was discussed and has been ordered. Patient will initiate this treatment in the next 1-2 weeks to assess if there is an improved more stable response.   No orders of the defined types were placed in this encounter.   Patient understands she can return to clinic at any time if she has any questions, concerns, or complaints.  Approximately 30 minutes was spent in discussion and consultation.     Lloyd Huger, MD 08/11/2015 8:17 PM

## 2015-08-12 ENCOUNTER — Inpatient Hospital Stay: Payer: Commercial Managed Care - HMO

## 2015-08-12 DIAGNOSIS — R630 Anorexia: Secondary | ICD-10-CM | POA: Diagnosis not present

## 2015-08-12 DIAGNOSIS — Z79899 Other long term (current) drug therapy: Secondary | ICD-10-CM | POA: Diagnosis not present

## 2015-08-12 DIAGNOSIS — D693 Immune thrombocytopenic purpura: Secondary | ICD-10-CM | POA: Diagnosis not present

## 2015-08-12 DIAGNOSIS — Z9081 Acquired absence of spleen: Secondary | ICD-10-CM | POA: Diagnosis not present

## 2015-08-12 DIAGNOSIS — R5383 Other fatigue: Secondary | ICD-10-CM | POA: Diagnosis not present

## 2015-08-12 DIAGNOSIS — R531 Weakness: Secondary | ICD-10-CM | POA: Diagnosis not present

## 2015-08-12 DIAGNOSIS — D473 Essential (hemorrhagic) thrombocythemia: Secondary | ICD-10-CM

## 2015-08-12 DIAGNOSIS — R197 Diarrhea, unspecified: Secondary | ICD-10-CM | POA: Diagnosis not present

## 2015-08-12 LAB — CBC WITH DIFFERENTIAL/PLATELET
BASOS ABS: 0.1 10*3/uL (ref 0–0.1)
Eosinophils Absolute: 0.1 10*3/uL (ref 0–0.7)
Eosinophils Relative: 1 %
HEMATOCRIT: 41.4 % (ref 35.0–47.0)
HEMOGLOBIN: 13.5 g/dL (ref 12.0–16.0)
Lymphs Abs: 3.1 10*3/uL (ref 1.0–3.6)
MCH: 30.3 pg (ref 26.0–34.0)
MCHC: 32.7 g/dL (ref 32.0–36.0)
MCV: 92.6 fL (ref 80.0–100.0)
MONO ABS: 0.6 10*3/uL (ref 0.2–0.9)
NEUTROS ABS: 4.6 10*3/uL (ref 1.4–6.5)
Platelets: 50 10*3/uL — ABNORMAL LOW (ref 150–440)
RBC: 4.47 MIL/uL (ref 3.80–5.20)
RDW: 16.2 % — ABNORMAL HIGH (ref 11.5–14.5)
WBC: 8.5 10*3/uL (ref 3.6–11.0)

## 2015-08-14 ENCOUNTER — Other Ambulatory Visit: Payer: Self-pay | Admitting: *Deleted

## 2015-08-14 DIAGNOSIS — M810 Age-related osteoporosis without current pathological fracture: Secondary | ICD-10-CM | POA: Diagnosis not present

## 2015-08-14 DIAGNOSIS — M150 Primary generalized (osteo)arthritis: Secondary | ICD-10-CM | POA: Diagnosis not present

## 2015-08-14 DIAGNOSIS — F419 Anxiety disorder, unspecified: Secondary | ICD-10-CM | POA: Diagnosis not present

## 2015-08-14 DIAGNOSIS — K219 Gastro-esophageal reflux disease without esophagitis: Secondary | ICD-10-CM | POA: Diagnosis not present

## 2015-08-14 DIAGNOSIS — F329 Major depressive disorder, single episode, unspecified: Secondary | ICD-10-CM | POA: Diagnosis not present

## 2015-08-14 DIAGNOSIS — D693 Immune thrombocytopenic purpura: Secondary | ICD-10-CM | POA: Diagnosis not present

## 2015-08-14 DIAGNOSIS — J45909 Unspecified asthma, uncomplicated: Secondary | ICD-10-CM | POA: Diagnosis not present

## 2015-08-14 DIAGNOSIS — F0390 Unspecified dementia without behavioral disturbance: Secondary | ICD-10-CM | POA: Diagnosis not present

## 2015-08-14 DIAGNOSIS — D649 Anemia, unspecified: Secondary | ICD-10-CM | POA: Diagnosis not present

## 2015-08-15 ENCOUNTER — Inpatient Hospital Stay: Payer: Commercial Managed Care - HMO

## 2015-08-15 ENCOUNTER — Ambulatory Visit: Payer: Commercial Managed Care - HMO

## 2015-08-15 ENCOUNTER — Inpatient Hospital Stay (HOSPITAL_BASED_OUTPATIENT_CLINIC_OR_DEPARTMENT_OTHER): Payer: Commercial Managed Care - HMO | Admitting: Oncology

## 2015-08-15 ENCOUNTER — Inpatient Hospital Stay: Payer: Commercial Managed Care - HMO | Attending: Oncology

## 2015-08-15 VITALS — BP 114/62 | HR 60 | Resp 20

## 2015-08-15 DIAGNOSIS — R5383 Other fatigue: Secondary | ICD-10-CM | POA: Diagnosis not present

## 2015-08-15 DIAGNOSIS — R531 Weakness: Secondary | ICD-10-CM | POA: Insufficient documentation

## 2015-08-15 DIAGNOSIS — D693 Immune thrombocytopenic purpura: Secondary | ICD-10-CM | POA: Insufficient documentation

## 2015-08-15 DIAGNOSIS — Z9081 Acquired absence of spleen: Secondary | ICD-10-CM | POA: Insufficient documentation

## 2015-08-15 DIAGNOSIS — Z79899 Other long term (current) drug therapy: Secondary | ICD-10-CM | POA: Diagnosis not present

## 2015-08-15 LAB — CBC WITH DIFFERENTIAL/PLATELET
BASOS ABS: 0.1 10*3/uL (ref 0–0.1)
Basophils Relative: 1 %
Eosinophils Absolute: 0.1 10*3/uL (ref 0–0.7)
Eosinophils Relative: 2 %
HEMATOCRIT: 42.2 % (ref 35.0–47.0)
HEMOGLOBIN: 13.7 g/dL (ref 12.0–16.0)
LYMPHS ABS: 3.1 10*3/uL (ref 1.0–3.6)
MCH: 30.1 pg (ref 26.0–34.0)
MCHC: 32.5 g/dL (ref 32.0–36.0)
MCV: 92.7 fL (ref 80.0–100.0)
Monocytes Absolute: 0.6 10*3/uL (ref 0.2–0.9)
Monocytes Relative: 7 %
NEUTROS ABS: 5.2 10*3/uL (ref 1.4–6.5)
Neutrophils Relative %: 56 %
Platelets: 32 10*3/uL — ABNORMAL LOW (ref 150–440)
RBC: 4.56 MIL/uL (ref 3.80–5.20)
RDW: 15.9 % — ABNORMAL HIGH (ref 11.5–14.5)
WBC: 9.1 10*3/uL (ref 3.6–11.0)

## 2015-08-15 MED ORDER — ROMIPLOSTIM 250 MCG ~~LOC~~ SOLR: 2.0000 ug/kg | Freq: Once | SUBCUTANEOUS | Status: DC

## 2015-08-15 MED ORDER — SODIUM CHLORIDE 0.9 % IJ SOLN
10.0000 mL | INTRAMUSCULAR | Status: DC | PRN
  Filled 2015-08-15: qty 10

## 2015-08-15 MED ORDER — ALTEPLASE 2 MG IJ SOLR: 2.0000 mg | Freq: Once | INTRAMUSCULAR | Status: DC | PRN

## 2015-08-15 MED ORDER — IMMUNE GLOBULIN (HUMAN) 10 GM/100ML IV SOLN
32.0000 g | Freq: Once | INTRAVENOUS | Status: AC
  Administered 2015-08-15: 30 g via INTRAVENOUS
  Filled 2015-08-15: qty 300

## 2015-08-15 MED ORDER — HEPARIN SOD (PORK) LOCK FLUSH 100 UNIT/ML IV SOLN: 500.0000 [IU] | Freq: Once | INTRAVENOUS | Status: DC | PRN

## 2015-08-15 MED ORDER — ACETAMINOPHEN 325 MG PO TABS
ORAL_TABLET | ORAL | Status: AC
  Filled 2015-08-15: qty 2

## 2015-08-15 MED ORDER — ACETAMINOPHEN 325 MG PO TABS
650.0000 mg | ORAL_TABLET | Freq: Four times a day (QID) | ORAL | Status: DC | PRN
  Administered 2015-08-15: 650 mg via ORAL

## 2015-08-15 MED ORDER — DIPHENHYDRAMINE HCL 25 MG PO TABS
25.0000 mg | ORAL_TABLET | Freq: Four times a day (QID) | ORAL | Status: DC | PRN
  Administered 2015-08-15: 25 mg via ORAL
  Filled 2015-08-15: qty 1

## 2015-08-15 MED ORDER — DIPHENHYDRAMINE HCL 25 MG PO CAPS
ORAL_CAPSULE | ORAL | Status: AC
  Filled 2015-08-15: qty 1

## 2015-08-15 MED ORDER — SODIUM CHLORIDE 0.9 % IV SOLN
Freq: Once | INTRAVENOUS | Status: AC
  Administered 2015-08-15: 12:00:00 via INTRAVENOUS
  Filled 2015-08-15: qty 1000

## 2015-08-15 MED ORDER — HEPARIN SOD (PORK) LOCK FLUSH 100 UNIT/ML IV SOLN: 250.0000 [IU] | Freq: Once | INTRAVENOUS | Status: DC | PRN

## 2015-08-15 MED ORDER — SODIUM CHLORIDE 0.9 % IJ SOLN
3.0000 mL | Freq: Once | INTRAMUSCULAR | Status: DC | PRN
  Filled 2015-08-15: qty 10

## 2015-08-16 ENCOUNTER — Ambulatory Visit: Payer: Commercial Managed Care - HMO

## 2015-08-16 ENCOUNTER — Other Ambulatory Visit: Payer: Self-pay | Admitting: *Deleted

## 2015-08-16 DIAGNOSIS — D693 Immune thrombocytopenic purpura: Secondary | ICD-10-CM

## 2015-08-19 ENCOUNTER — Inpatient Hospital Stay: Payer: Commercial Managed Care - HMO

## 2015-08-19 DIAGNOSIS — R413 Other amnesia: Secondary | ICD-10-CM | POA: Diagnosis not present

## 2015-08-19 DIAGNOSIS — R5383 Other fatigue: Secondary | ICD-10-CM | POA: Diagnosis not present

## 2015-08-19 DIAGNOSIS — N393 Stress incontinence (female) (male): Secondary | ICD-10-CM | POA: Diagnosis not present

## 2015-08-19 DIAGNOSIS — I10 Essential (primary) hypertension: Secondary | ICD-10-CM | POA: Diagnosis not present

## 2015-08-19 DIAGNOSIS — R531 Weakness: Secondary | ICD-10-CM | POA: Diagnosis not present

## 2015-08-19 DIAGNOSIS — D693 Immune thrombocytopenic purpura: Secondary | ICD-10-CM | POA: Diagnosis not present

## 2015-08-19 DIAGNOSIS — M5416 Radiculopathy, lumbar region: Secondary | ICD-10-CM | POA: Diagnosis not present

## 2015-08-19 DIAGNOSIS — Z79899 Other long term (current) drug therapy: Secondary | ICD-10-CM | POA: Diagnosis not present

## 2015-08-19 DIAGNOSIS — F419 Anxiety disorder, unspecified: Secondary | ICD-10-CM | POA: Diagnosis not present

## 2015-08-19 DIAGNOSIS — Z9081 Acquired absence of spleen: Secondary | ICD-10-CM | POA: Diagnosis not present

## 2015-08-19 DIAGNOSIS — M5136 Other intervertebral disc degeneration, lumbar region: Secondary | ICD-10-CM | POA: Diagnosis not present

## 2015-08-19 DIAGNOSIS — E7800 Pure hypercholesterolemia, unspecified: Secondary | ICD-10-CM | POA: Diagnosis not present

## 2015-08-19 DIAGNOSIS — E039 Hypothyroidism, unspecified: Secondary | ICD-10-CM | POA: Diagnosis not present

## 2015-08-19 LAB — CBC WITH DIFFERENTIAL/PLATELET
Basophils Absolute: 0 10*3/uL (ref 0–0.1)
Basophils Relative: 0 %
EOS PCT: 2 %
Eosinophils Absolute: 0.2 10*3/uL (ref 0–0.7)
HCT: 39.7 % (ref 35.0–47.0)
Hemoglobin: 13.1 g/dL (ref 12.0–16.0)
LYMPHS ABS: 3 10*3/uL (ref 1.0–3.6)
LYMPHS PCT: 34 %
MCH: 30.4 pg (ref 26.0–34.0)
MCHC: 32.9 g/dL (ref 32.0–36.0)
MCV: 92.4 fL (ref 80.0–100.0)
MONO ABS: 0.7 10*3/uL (ref 0.2–0.9)
Monocytes Relative: 8 %
Neutro Abs: 5.1 10*3/uL (ref 1.4–6.5)
Neutrophils Relative %: 57 %
PLATELETS: 43 10*3/uL — AB (ref 150–440)
RBC: 4.3 MIL/uL (ref 3.80–5.20)
RDW: 16 % — ABNORMAL HIGH (ref 11.5–14.5)
WBC: 9 10*3/uL (ref 3.6–11.0)

## 2015-08-20 ENCOUNTER — Other Ambulatory Visit: Payer: Self-pay | Admitting: Oncology

## 2015-08-21 ENCOUNTER — Inpatient Hospital Stay: Payer: Commercial Managed Care - HMO

## 2015-08-21 VITALS — BP 160/60 | HR 55 | Temp 97.0°F | Resp 20

## 2015-08-21 DIAGNOSIS — Z9081 Acquired absence of spleen: Secondary | ICD-10-CM | POA: Diagnosis not present

## 2015-08-21 DIAGNOSIS — Z79899 Other long term (current) drug therapy: Secondary | ICD-10-CM | POA: Diagnosis not present

## 2015-08-21 DIAGNOSIS — R5383 Other fatigue: Secondary | ICD-10-CM | POA: Diagnosis not present

## 2015-08-21 DIAGNOSIS — R531 Weakness: Secondary | ICD-10-CM | POA: Diagnosis not present

## 2015-08-21 DIAGNOSIS — D693 Immune thrombocytopenic purpura: Secondary | ICD-10-CM | POA: Diagnosis not present

## 2015-08-21 MED ORDER — DIPHENHYDRAMINE HCL 25 MG PO TABS
25.0000 mg | ORAL_TABLET | ORAL | Status: DC | PRN
  Administered 2015-08-21: 25 mg via ORAL
  Filled 2015-08-21: qty 1

## 2015-08-21 MED ORDER — SODIUM CHLORIDE 0.9 % IV SOLN
Freq: Once | INTRAVENOUS | Status: AC
  Administered 2015-08-21: 09:00:00 via INTRAVENOUS
  Filled 2015-08-21: qty 1000

## 2015-08-21 MED ORDER — ACETAMINOPHEN 325 MG PO TABS
650.0000 mg | ORAL_TABLET | Freq: Four times a day (QID) | ORAL | Status: DC | PRN
  Administered 2015-08-21: 650 mg via ORAL
  Filled 2015-08-21: qty 2

## 2015-08-21 MED ORDER — IMMUNE GLOBULIN (HUMAN) 10 GM/100ML IV SOLN
30.0000 g | Freq: Once | INTRAVENOUS | Status: AC
  Administered 2015-08-21: 30 g via INTRAVENOUS
  Filled 2015-08-21: qty 300

## 2015-08-22 ENCOUNTER — Inpatient Hospital Stay: Payer: Commercial Managed Care - HMO

## 2015-08-22 VITALS — BP 128/62 | HR 67 | Temp 96.8°F

## 2015-08-22 DIAGNOSIS — Z9081 Acquired absence of spleen: Secondary | ICD-10-CM | POA: Diagnosis not present

## 2015-08-22 DIAGNOSIS — R5383 Other fatigue: Secondary | ICD-10-CM | POA: Diagnosis not present

## 2015-08-22 DIAGNOSIS — R531 Weakness: Secondary | ICD-10-CM | POA: Diagnosis not present

## 2015-08-22 DIAGNOSIS — D693 Immune thrombocytopenic purpura: Secondary | ICD-10-CM

## 2015-08-22 DIAGNOSIS — Z79899 Other long term (current) drug therapy: Secondary | ICD-10-CM | POA: Diagnosis not present

## 2015-08-22 MED ORDER — SODIUM CHLORIDE 0.9 % IJ SOLN
3.0000 mL | Freq: Once | INTRAMUSCULAR | Status: DC | PRN
  Filled 2015-08-22: qty 10

## 2015-08-22 MED ORDER — ACETAMINOPHEN 325 MG PO TABS
650.0000 mg | ORAL_TABLET | Freq: Once | ORAL | Status: AC
  Administered 2015-08-22: 650 mg via ORAL

## 2015-08-22 MED ORDER — IMMUNE GLOBULIN (HUMAN) 10 GM/100ML IV SOLN
30.0000 g | Freq: Once | INTRAVENOUS | Status: AC
Start: 2015-08-22 — End: 2015-08-22
  Administered 2015-08-22: 30 g via INTRAVENOUS
  Filled 2015-08-22: qty 100

## 2015-08-22 MED ORDER — DIPHENHYDRAMINE HCL 25 MG PO CAPS
25.0000 mg | ORAL_CAPSULE | Freq: Once | ORAL | Status: AC
  Administered 2015-08-22: 25 mg via ORAL

## 2015-08-22 MED ORDER — ALTEPLASE 2 MG IJ SOLR
2.0000 mg | Freq: Once | INTRAMUSCULAR | Status: DC | PRN
  Filled 2015-08-22: qty 2

## 2015-08-22 MED ORDER — SODIUM CHLORIDE 0.9 % IJ SOLN
10.0000 mL | INTRAMUSCULAR | Status: DC | PRN
  Filled 2015-08-22: qty 10

## 2015-08-22 MED ORDER — HEPARIN SOD (PORK) LOCK FLUSH 100 UNIT/ML IV SOLN: 250.0000 [IU] | Freq: Once | INTRAVENOUS | Status: DC | PRN

## 2015-08-22 MED ORDER — HEPARIN SOD (PORK) LOCK FLUSH 100 UNIT/ML IV SOLN: 500.0000 [IU] | Freq: Once | INTRAVENOUS | Status: DC | PRN

## 2015-08-23 ENCOUNTER — Other Ambulatory Visit: Payer: Self-pay | Admitting: *Deleted

## 2015-08-23 DIAGNOSIS — D693 Immune thrombocytopenic purpura: Secondary | ICD-10-CM

## 2015-08-23 NOTE — Progress Notes (Signed)
[facility] OFFICE PROGRESS NOTE  Patient Care Team: Idelle Crouch, MD as PCP - General (Internal Medicine)  HPI  SUMMARY of HEMATOLOGIC HISTORY:  #  ITP s/p Splenectomy [2007]; POOR RESPONSE to Prednisone; WinRho; Rituxan; RESPONSIVE to IVIG  # OCT 2015- START N-PLATE- LABILE RESPONSE  INTERVAL HISTORY:   Patient returns to clinic today for further evaluation and repeat laboratory work. She has noticed increasing weakness and fatigue similar to previous when her platelets began to decline. She does not complain of easy bleeding or bruising today. She denies any fevers. She has no chest pain or shortness of breath. She denies any nausea, vomiting, constipation, or diarrhea. She does not have any melena or hematochezia. Patient offers no  further specific complaints today.   REVIEW OF SYSTEMS:  10 point review of systems was done with the patient and are negative.  I have reviewed the past medical history, past surgical history, social history and family history with the patient and they are unchanged from previous note unless stated above.  ALLERGIES:  is allergic to aspirin; diazepam; morphine; other; and tetanus toxoids.  MEDICATIONS:  Current Outpatient Prescriptions  Medication Sig Dispense Refill  . acetaminophen (TYLENOL) 325 MG tablet Take 2 tablets (650 mg total) by mouth every 6 (six) hours as needed for mild pain (or Fever >/= 101). 100 tablet 0  . albuterol (PROVENTIL HFA;VENTOLIN HFA) 108 (90 BASE) MCG/ACT inhaler Inhale 2 puffs into the lungs every 6 (six) hours as needed for wheezing or shortness of breath.    . BUDESONIDE-FORMOTEROL FUMARATE IN Inhale 2 puffs into the lungs 2 (two) times daily as needed (for shortness of breath and wheezing.).     Marland Kitchen Calcium Carbonate-Vitamin D (CALCIUM 600+D) 600-400 MG-UNIT per tablet Take 1 tablet by mouth daily.    . citalopram (CELEXA) 40 MG tablet Take 40 mg by mouth daily.    . clotrimazole-betamethasone (LOTRISONE)  cream Apply 1 application topically daily as needed (for rash.).     Marland Kitchen Cyanocobalamin (RA VITAMIN B-12 TR) 1000 MCG TBCR Take 1,000 mcg by mouth daily.     Marland Kitchen docusate sodium (COLACE) 100 MG capsule Take 1 capsule (100 mg total) by mouth 2 (two) times daily as needed for mild constipation. 10 capsule 0  . donepezil (ARICEPT) 5 MG tablet Take 5 mg by mouth at bedtime.     Marland Kitchen eltrombopag (PROMACTA) 50 MG tablet Take 50 mg by mouth daily. Take on an empty stomach 1 hour before a meal or 2 hours after    . gabapentin (NEURONTIN) 300 MG capsule 300mg  orally 4 times a day    . HYDROcodone-acetaminophen (NORCO/VICODIN) 5-325 MG per tablet Take 1 tab by mouth every 4-6 hours as needed for pain    . levothyroxine (SYNTHROID, LEVOTHROID) 50 MCG tablet Take 50 mcg by mouth daily before breakfast. Take 30 to 60 minutes before breakfast.    . Multiple Vitamins-Minerals (OCUVITE-LUTEIN PO) Take 1 capsule by mouth daily.     . ondansetron (ZOFRAN) 4 MG tablet Take 1 tablet (4 mg total) by mouth every 6 (six) hours as needed for nausea. 20 tablet 0  . pantoprazole (PROTONIX) 20 MG tablet Take by mouth.    . pravastatin (PRAVACHOL) 20 MG tablet Take 20 mg by mouth daily.    Marland Kitchen senna (SENOKOT) 8.6 MG TABS tablet Take 1 tablet (8.6 mg total) by mouth daily as needed for mild constipation. 120 each 0  . traMADol (ULTRAM) 50 MG tablet Take  50 mg by mouth 3 (three) times daily as needed for moderate pain or severe pain. pain    . venlafaxine XR (EFFEXOR-XR) 37.5 MG 24 hr capsule Take 1 capsule by mouth daily.     . vitamin E (E-400) 400 UNIT capsule Take 400 Units by mouth daily.    Marland Kitchen zolpidem (AMBIEN) 5 MG tablet Take 5 mg by mouth at bedtime as needed for sleep.     No current facility-administered medications for this visit.    PHYSICAL EXAMINATION:   There were no vitals taken for this visit.  There were no vitals filed for this visit.  General: Well-developed, well-nourished, no acute distress. Eyes: Pink  conjunctiva, anicteric sclera. Lungs: Clear to auscultation bilaterally. Heart: Regular rate and rhythm. No rubs, murmurs, or gallops. Abdomen: Soft, nontender, nondistended. No organomegaly noted, normoactive bowel sounds. Musculoskeletal: No edema, cyanosis, or clubbing. Neuro: Alert, answering all questions appropriately. Cranial nerves grossly intact. Skin: Several ecchymosis in various degrees of healing noted.  Psych: Normal affect.  LABORATORY DATA:  I have reviewed the data as listed    Component Value Date/Time   NA 135 07/23/2015 1719   NA 143 06/13/2014 1428   K 3.0* 07/23/2015 1719   K 4.2 06/13/2014 1428   CL 107 07/23/2015 1719   CL 105 06/13/2014 1428   CO2 25 07/23/2015 1719   CO2 31 06/13/2014 1428   GLUCOSE 90 07/23/2015 1719   GLUCOSE 70 06/13/2014 1428   BUN 12 07/23/2015 1719   BUN 15 06/13/2014 1428   CREATININE 0.81 07/26/2015 1053   CREATININE 1.05 06/13/2014 1428   CALCIUM 8.8* 07/23/2015 1719   CALCIUM 8.5 06/13/2014 1428   PROT 8.1 07/26/2015 1053   PROT 6.5 06/01/2014 1109   ALBUMIN 3.8 07/26/2015 1053   ALBUMIN 3.4 06/01/2014 1109   AST 38 07/26/2015 1053   AST 31 06/01/2014 1109   ALT 30 07/26/2015 1053   ALT 76* 06/01/2014 1109   ALKPHOS 68 07/26/2015 1053   ALKPHOS 93 06/01/2014 1109   BILITOT 0.6 07/26/2015 1053   BILITOT 0.3 06/01/2014 1109   GFRNONAA >60 07/26/2015 1053   GFRNONAA 51* 06/13/2014 1428   GFRAA >60 07/26/2015 1053   GFRAA 59* 06/13/2014 1428    No results found for: SPEP, UPEP  Lab Results  Component Value Date   WBC 9.0 08/19/2015   NEUTROABS 5.1 08/19/2015   HGB 13.1 08/19/2015   HCT 39.7 08/19/2015   MCV 92.4 08/19/2015   PLT 43* 08/19/2015      Chemistry      Component Value Date/Time   NA 135 07/23/2015 1719   NA 143 06/13/2014 1428   K 3.0* 07/23/2015 1719   K 4.2 06/13/2014 1428   CL 107 07/23/2015 1719   CL 105 06/13/2014 1428   CO2 25 07/23/2015 1719   CO2 31 06/13/2014 1428   BUN 12  07/23/2015 1719   BUN 15 06/13/2014 1428   CREATININE 0.81 07/26/2015 1053   CREATININE 1.05 06/13/2014 1428      Component Value Date/Time   CALCIUM 8.8* 07/23/2015 1719   CALCIUM 8.5 06/13/2014 1428   ALKPHOS 68 07/26/2015 1053   ALKPHOS 93 06/01/2014 1109   AST 38 07/26/2015 1053   AST 31 06/01/2014 1109   ALT 30 07/26/2015 1053   ALT 76* 06/01/2014 1109   BILITOT 0.6 07/26/2015 1053   BILITOT 0.3 06/01/2014 1109       RADIOGRAPHIC STUDIES: I have personally reviewed the radiological images as  listed and agreed with the findings in the report. No results found.   ASSESSMENT & PLAN:   1.  Chronic refractory ITP:  Patient had a poor response to  Prednisone, WinRho, and Rituxan. She was previously on Nplate and her response was labile at best.  She responds to IVIG, although this is not very durable only lasting approximately 3 weeks. Patient's platelet count has declined again and are now 32 approximately 3 weeks after receiving IVIG. Because of scheduling conflicts, patient can only receive one day of IVIG therefore will proceed with 0.5 mg/kg today. Return to clinic on Monday for repeat platelet count. If her platelets continue to remain low, will consider additional IVIG next week. Previously, Promacta was discussed and has been ordered. Patient will initiate this treatment once it is approved by insurance to assess if there is an improved more stable response.   Patient understands she can return to clinic at any time if she has any questions, concerns, or complaints.      Lloyd Huger, MD 08/23/2015 9:07 AM

## 2015-08-26 ENCOUNTER — Inpatient Hospital Stay: Payer: Commercial Managed Care - HMO

## 2015-08-26 ENCOUNTER — Inpatient Hospital Stay (HOSPITAL_BASED_OUTPATIENT_CLINIC_OR_DEPARTMENT_OTHER): Payer: Commercial Managed Care - HMO | Admitting: Oncology

## 2015-08-26 VITALS — BP 163/59 | HR 59 | Temp 96.0°F | Resp 16 | Wt 147.0 lb

## 2015-08-26 DIAGNOSIS — R531 Weakness: Secondary | ICD-10-CM

## 2015-08-26 DIAGNOSIS — Z9081 Acquired absence of spleen: Secondary | ICD-10-CM

## 2015-08-26 DIAGNOSIS — D693 Immune thrombocytopenic purpura: Secondary | ICD-10-CM

## 2015-08-26 DIAGNOSIS — R5383 Other fatigue: Secondary | ICD-10-CM

## 2015-08-26 DIAGNOSIS — Z79899 Other long term (current) drug therapy: Secondary | ICD-10-CM | POA: Diagnosis not present

## 2015-08-26 LAB — CBC WITH DIFFERENTIAL/PLATELET
BASOS ABS: 0.1 10*3/uL (ref 0–0.1)
BASOS PCT: 1 %
EOS ABS: 0.1 10*3/uL (ref 0–0.7)
EOS PCT: 2 %
HCT: 38.3 % (ref 35.0–47.0)
Hemoglobin: 12.5 g/dL (ref 12.0–16.0)
Lymphocytes Relative: 39 %
Lymphs Abs: 2.5 10*3/uL (ref 1.0–3.6)
MCH: 30.3 pg (ref 26.0–34.0)
MCHC: 32.7 g/dL (ref 32.0–36.0)
MCV: 92.7 fL (ref 80.0–100.0)
MONO ABS: 0.5 10*3/uL (ref 0.2–0.9)
MONOS PCT: 8 %
NEUTROS ABS: 3.2 10*3/uL (ref 1.4–6.5)
Neutrophils Relative %: 50 %
PLATELETS: 151 10*3/uL (ref 150–440)
RBC: 4.14 MIL/uL (ref 3.80–5.20)
RDW: 16.5 % — ABNORMAL HIGH (ref 11.5–14.5)
WBC: 6.4 10*3/uL (ref 3.6–11.0)

## 2015-08-26 NOTE — Progress Notes (Signed)
Patient has been feeling fatigued. 

## 2015-08-29 DIAGNOSIS — D693 Immune thrombocytopenic purpura: Secondary | ICD-10-CM | POA: Diagnosis not present

## 2015-08-29 DIAGNOSIS — F419 Anxiety disorder, unspecified: Secondary | ICD-10-CM | POA: Diagnosis not present

## 2015-08-29 DIAGNOSIS — D649 Anemia, unspecified: Secondary | ICD-10-CM | POA: Diagnosis not present

## 2015-08-29 DIAGNOSIS — M150 Primary generalized (osteo)arthritis: Secondary | ICD-10-CM | POA: Diagnosis not present

## 2015-08-29 DIAGNOSIS — K219 Gastro-esophageal reflux disease without esophagitis: Secondary | ICD-10-CM | POA: Diagnosis not present

## 2015-08-29 DIAGNOSIS — F0390 Unspecified dementia without behavioral disturbance: Secondary | ICD-10-CM | POA: Diagnosis not present

## 2015-08-29 DIAGNOSIS — M810 Age-related osteoporosis without current pathological fracture: Secondary | ICD-10-CM | POA: Diagnosis not present

## 2015-08-29 DIAGNOSIS — H40003 Preglaucoma, unspecified, bilateral: Secondary | ICD-10-CM | POA: Diagnosis not present

## 2015-08-29 DIAGNOSIS — F329 Major depressive disorder, single episode, unspecified: Secondary | ICD-10-CM | POA: Diagnosis not present

## 2015-08-29 DIAGNOSIS — J45909 Unspecified asthma, uncomplicated: Secondary | ICD-10-CM | POA: Diagnosis not present

## 2015-08-29 DIAGNOSIS — H353131 Nonexudative age-related macular degeneration, bilateral, early dry stage: Secondary | ICD-10-CM | POA: Diagnosis not present

## 2015-09-02 ENCOUNTER — Inpatient Hospital Stay: Payer: Commercial Managed Care - HMO

## 2015-09-02 ENCOUNTER — Other Ambulatory Visit: Payer: Self-pay | Admitting: Internal Medicine

## 2015-09-02 DIAGNOSIS — R531 Weakness: Secondary | ICD-10-CM | POA: Diagnosis not present

## 2015-09-02 DIAGNOSIS — Z79899 Other long term (current) drug therapy: Secondary | ICD-10-CM | POA: Diagnosis not present

## 2015-09-02 DIAGNOSIS — D693 Immune thrombocytopenic purpura: Secondary | ICD-10-CM

## 2015-09-02 DIAGNOSIS — Z9081 Acquired absence of spleen: Secondary | ICD-10-CM | POA: Diagnosis not present

## 2015-09-02 DIAGNOSIS — R5383 Other fatigue: Secondary | ICD-10-CM | POA: Diagnosis not present

## 2015-09-02 LAB — CBC WITH DIFFERENTIAL/PLATELET
Basophils Absolute: 0.1 10*3/uL (ref 0–0.1)
Basophils Relative: 1 %
Eosinophils Absolute: 0.2 10*3/uL (ref 0–0.7)
Eosinophils Relative: 3 %
HEMATOCRIT: 38.9 % (ref 35.0–47.0)
HEMOGLOBIN: 12.8 g/dL (ref 12.0–16.0)
LYMPHS ABS: 2.4 10*3/uL (ref 1.0–3.6)
LYMPHS PCT: 38 %
MCH: 30.4 pg (ref 26.0–34.0)
MCHC: 32.8 g/dL (ref 32.0–36.0)
MCV: 92.7 fL (ref 80.0–100.0)
MONOS PCT: 8 %
Monocytes Absolute: 0.5 10*3/uL (ref 0.2–0.9)
NEUTROS ABS: 3.1 10*3/uL (ref 1.4–6.5)
NEUTROS PCT: 50 %
Platelets: 39 10*3/uL — ABNORMAL LOW (ref 150–440)
RBC: 4.2 MIL/uL (ref 3.80–5.20)
RDW: 15.5 % — ABNORMAL HIGH (ref 11.5–14.5)
WBC: 6.2 10*3/uL (ref 3.6–11.0)

## 2015-09-03 ENCOUNTER — Inpatient Hospital Stay: Payer: Commercial Managed Care - HMO

## 2015-09-03 DIAGNOSIS — Z9081 Acquired absence of spleen: Secondary | ICD-10-CM | POA: Diagnosis not present

## 2015-09-03 DIAGNOSIS — R531 Weakness: Secondary | ICD-10-CM | POA: Diagnosis not present

## 2015-09-03 DIAGNOSIS — D693 Immune thrombocytopenic purpura: Secondary | ICD-10-CM | POA: Diagnosis not present

## 2015-09-03 DIAGNOSIS — R5383 Other fatigue: Secondary | ICD-10-CM | POA: Diagnosis not present

## 2015-09-03 DIAGNOSIS — Z1211 Encounter for screening for malignant neoplasm of colon: Secondary | ICD-10-CM | POA: Diagnosis not present

## 2015-09-03 DIAGNOSIS — Z79899 Other long term (current) drug therapy: Secondary | ICD-10-CM | POA: Diagnosis not present

## 2015-09-03 MED ORDER — SODIUM CHLORIDE 0.9 % IV SOLN
INTRAVENOUS | Status: DC
  Administered 2015-09-03: 10:00:00 via INTRAVENOUS
  Filled 2015-09-03: qty 1000

## 2015-09-03 MED ORDER — ACETAMINOPHEN 325 MG PO TABS
650.0000 mg | ORAL_TABLET | Freq: Four times a day (QID) | ORAL | Status: DC | PRN
  Administered 2015-09-03: 650 mg via ORAL

## 2015-09-03 MED ORDER — DIPHENHYDRAMINE HCL 25 MG PO TABS
25.0000 mg | ORAL_TABLET | ORAL | Status: DC | PRN
  Administered 2015-09-03: 25 mg via ORAL
  Filled 2015-09-03: qty 1

## 2015-09-03 MED ORDER — ACETAMINOPHEN 325 MG PO TABS
ORAL_TABLET | ORAL | Status: AC
  Filled 2015-09-03: qty 2

## 2015-09-03 MED ORDER — IMMUNE GLOBULIN (HUMAN) 10 GM/100ML IV SOLN
0.5000 g/kg | Freq: Once | INTRAVENOUS | Status: AC
  Administered 2015-09-03: 35 g via INTRAVENOUS
  Filled 2015-09-03: qty 350

## 2015-09-03 MED ORDER — DIPHENHYDRAMINE HCL 25 MG PO CAPS
ORAL_CAPSULE | ORAL | Status: AC
  Filled 2015-09-03: qty 1

## 2015-09-04 ENCOUNTER — Inpatient Hospital Stay: Payer: Commercial Managed Care - HMO

## 2015-09-04 VITALS — BP 124/73 | HR 79 | Resp 18

## 2015-09-04 DIAGNOSIS — Z9081 Acquired absence of spleen: Secondary | ICD-10-CM | POA: Diagnosis not present

## 2015-09-04 DIAGNOSIS — R5383 Other fatigue: Secondary | ICD-10-CM | POA: Diagnosis not present

## 2015-09-04 DIAGNOSIS — Z79899 Other long term (current) drug therapy: Secondary | ICD-10-CM | POA: Diagnosis not present

## 2015-09-04 DIAGNOSIS — R531 Weakness: Secondary | ICD-10-CM | POA: Diagnosis not present

## 2015-09-04 DIAGNOSIS — D693 Immune thrombocytopenic purpura: Secondary | ICD-10-CM | POA: Diagnosis not present

## 2015-09-04 MED ORDER — ACETAMINOPHEN 325 MG PO TABS
ORAL_TABLET | ORAL | Status: AC
  Filled 2015-09-04: qty 2

## 2015-09-04 MED ORDER — IMMUNE GLOBULIN (HUMAN) 10 GM/100ML IV SOLN
0.5000 g/kg | Freq: Once | INTRAVENOUS | Status: AC
  Administered 2015-09-04: 35 g via INTRAVENOUS
  Filled 2015-09-04: qty 350

## 2015-09-04 MED ORDER — DIPHENHYDRAMINE HCL 25 MG PO TABS
25.0000 mg | ORAL_TABLET | ORAL | Status: DC | PRN
  Administered 2015-09-04: 25 mg via ORAL
  Filled 2015-09-04: qty 1

## 2015-09-04 MED ORDER — SODIUM CHLORIDE 0.9 % IV SOLN
INTRAVENOUS | Status: DC
  Administered 2015-09-04: 10:00:00 via INTRAVENOUS
  Filled 2015-09-04: qty 1000

## 2015-09-04 MED ORDER — DIPHENHYDRAMINE HCL 25 MG PO CAPS
ORAL_CAPSULE | ORAL | Status: AC
  Filled 2015-09-04: qty 1

## 2015-09-04 MED ORDER — ACETAMINOPHEN 325 MG PO TABS
650.0000 mg | ORAL_TABLET | Freq: Four times a day (QID) | ORAL | Status: DC | PRN
  Administered 2015-09-04: 325 mg via ORAL

## 2015-09-08 DIAGNOSIS — Z1212 Encounter for screening for malignant neoplasm of rectum: Secondary | ICD-10-CM | POA: Diagnosis not present

## 2015-09-08 DIAGNOSIS — Z1211 Encounter for screening for malignant neoplasm of colon: Secondary | ICD-10-CM | POA: Diagnosis not present

## 2015-09-09 ENCOUNTER — Inpatient Hospital Stay: Payer: Commercial Managed Care - HMO

## 2015-09-09 DIAGNOSIS — M5416 Radiculopathy, lumbar region: Secondary | ICD-10-CM | POA: Diagnosis not present

## 2015-09-09 DIAGNOSIS — M62830 Muscle spasm of back: Secondary | ICD-10-CM | POA: Insufficient documentation

## 2015-09-09 DIAGNOSIS — M5136 Other intervertebral disc degeneration, lumbar region: Secondary | ICD-10-CM | POA: Diagnosis not present

## 2015-09-10 ENCOUNTER — Encounter: Payer: Self-pay | Admitting: *Deleted

## 2015-09-10 ENCOUNTER — Encounter
Admission: RE | Disposition: A | Discharge status: Home / Self Care | Payer: Self-pay | Source: Ambulatory Visit | Attending: Gastroenterology

## 2015-09-10 ENCOUNTER — Ambulatory Visit
Admission: RE | Admit: 2015-09-10 | Discharge: 2015-09-10 | Disposition: A | Discharge status: Home / Self Care | Payer: Commercial Managed Care - HMO | Source: Ambulatory Visit | Attending: Gastroenterology | Admitting: Gastroenterology

## 2015-09-10 ENCOUNTER — Ambulatory Visit: Payer: Commercial Managed Care - HMO | Admitting: Anesthesiology

## 2015-09-10 ENCOUNTER — Other Ambulatory Visit: Payer: Commercial Managed Care - HMO

## 2015-09-10 DIAGNOSIS — F329 Major depressive disorder, single episode, unspecified: Secondary | ICD-10-CM | POA: Diagnosis not present

## 2015-09-10 DIAGNOSIS — Z886 Allergy status to analgesic agent status: Secondary | ICD-10-CM | POA: Insufficient documentation

## 2015-09-10 DIAGNOSIS — D693 Immune thrombocytopenic purpura: Secondary | ICD-10-CM | POA: Diagnosis not present

## 2015-09-10 DIAGNOSIS — G8929 Other chronic pain: Secondary | ICD-10-CM | POA: Insufficient documentation

## 2015-09-10 DIAGNOSIS — K449 Diaphragmatic hernia without obstruction or gangrene: Secondary | ICD-10-CM | POA: Diagnosis not present

## 2015-09-10 DIAGNOSIS — R1310 Dysphagia, unspecified: Secondary | ICD-10-CM | POA: Insufficient documentation

## 2015-09-10 DIAGNOSIS — R195 Other fecal abnormalities: Secondary | ICD-10-CM | POA: Diagnosis not present

## 2015-09-10 DIAGNOSIS — D131 Benign neoplasm of stomach: Secondary | ICD-10-CM | POA: Diagnosis not present

## 2015-09-10 DIAGNOSIS — Z887 Allergy status to serum and vaccine status: Secondary | ICD-10-CM | POA: Diagnosis not present

## 2015-09-10 DIAGNOSIS — K921 Melena: Secondary | ICD-10-CM | POA: Diagnosis not present

## 2015-09-10 DIAGNOSIS — J45909 Unspecified asthma, uncomplicated: Secondary | ICD-10-CM | POA: Insufficient documentation

## 2015-09-10 DIAGNOSIS — K2950 Unspecified chronic gastritis without bleeding: Secondary | ICD-10-CM | POA: Diagnosis not present

## 2015-09-10 DIAGNOSIS — F419 Anxiety disorder, unspecified: Secondary | ICD-10-CM | POA: Insufficient documentation

## 2015-09-10 DIAGNOSIS — M549 Dorsalgia, unspecified: Secondary | ICD-10-CM | POA: Diagnosis not present

## 2015-09-10 DIAGNOSIS — K2970 Gastritis, unspecified, without bleeding: Secondary | ICD-10-CM | POA: Diagnosis not present

## 2015-09-10 DIAGNOSIS — K2960 Other gastritis without bleeding: Secondary | ICD-10-CM | POA: Diagnosis not present

## 2015-09-10 DIAGNOSIS — M1990 Unspecified osteoarthritis, unspecified site: Secondary | ICD-10-CM | POA: Diagnosis not present

## 2015-09-10 DIAGNOSIS — K317 Polyp of stomach and duodenum: Secondary | ICD-10-CM | POA: Diagnosis not present

## 2015-09-10 DIAGNOSIS — K319 Disease of stomach and duodenum, unspecified: Secondary | ICD-10-CM | POA: Diagnosis not present

## 2015-09-10 DIAGNOSIS — K219 Gastro-esophageal reflux disease without esophagitis: Secondary | ICD-10-CM | POA: Diagnosis not present

## 2015-09-10 DIAGNOSIS — R1013 Epigastric pain: Secondary | ICD-10-CM | POA: Insufficient documentation

## 2015-09-10 DIAGNOSIS — D649 Anemia, unspecified: Secondary | ICD-10-CM | POA: Diagnosis not present

## 2015-09-10 DIAGNOSIS — E7800 Pure hypercholesterolemia, unspecified: Secondary | ICD-10-CM | POA: Insufficient documentation

## 2015-09-10 DIAGNOSIS — Z885 Allergy status to narcotic agent status: Secondary | ICD-10-CM | POA: Insufficient documentation

## 2015-09-10 DIAGNOSIS — M810 Age-related osteoporosis without current pathological fracture: Secondary | ICD-10-CM | POA: Diagnosis not present

## 2015-09-10 DIAGNOSIS — Z79891 Long term (current) use of opiate analgesic: Secondary | ICD-10-CM | POA: Diagnosis not present

## 2015-09-10 DIAGNOSIS — G2581 Restless legs syndrome: Secondary | ICD-10-CM | POA: Insufficient documentation

## 2015-09-10 DIAGNOSIS — Z888 Allergy status to other drugs, medicaments and biological substances status: Secondary | ICD-10-CM | POA: Insufficient documentation

## 2015-09-10 DIAGNOSIS — Z86711 Personal history of pulmonary embolism: Secondary | ICD-10-CM | POA: Insufficient documentation

## 2015-09-10 DIAGNOSIS — K2900 Acute gastritis without bleeding: Secondary | ICD-10-CM | POA: Diagnosis not present

## 2015-09-10 DIAGNOSIS — J302 Other seasonal allergic rhinitis: Secondary | ICD-10-CM | POA: Diagnosis not present

## 2015-09-10 HISTORY — PX: ESOPHAGOGASTRODUODENOSCOPY (EGD) WITH PROPOFOL: SHX5813

## 2015-09-10 LAB — CBC
HEMATOCRIT: 37.8 % (ref 35.0–47.0)
Hemoglobin: 12.6 g/dL (ref 12.0–16.0)
MCH: 30.9 pg (ref 26.0–34.0)
MCHC: 33.3 g/dL (ref 32.0–36.0)
MCV: 92.7 fL (ref 80.0–100.0)
Platelets: 192 10*3/uL (ref 150–440)
RBC: 4.08 MIL/uL (ref 3.80–5.20)
RDW: 15.7 % — AB (ref 11.5–14.5)
WBC: 9.1 10*3/uL (ref 3.6–11.0)

## 2015-09-10 SURGERY — ESOPHAGOGASTRODUODENOSCOPY (EGD) WITH PROPOFOL
Anesthesia: General

## 2015-09-10 MED ORDER — PROPOFOL 500 MG/50ML IV EMUL
INTRAVENOUS | Status: DC | PRN
  Administered 2015-09-10: 100 ug/kg/min via INTRAVENOUS

## 2015-09-10 MED ORDER — SODIUM CHLORIDE 0.9 % IV SOLN: INTRAVENOUS | Status: DC

## 2015-09-10 MED ORDER — LIDOCAINE HCL (CARDIAC) 20 MG/ML IV SOLN
INTRAVENOUS | Status: DC | PRN
  Administered 2015-09-10: 100 mg via INTRAVENOUS

## 2015-09-10 MED ORDER — PROPOFOL 10 MG/ML IV BOLUS
INTRAVENOUS | Status: DC | PRN
  Administered 2015-09-10 (×2): 50 mg via INTRAVENOUS

## 2015-09-10 MED ORDER — SODIUM CHLORIDE 0.9 % IV SOLN
INTRAVENOUS | Status: DC
  Administered 2015-09-10: 09:00:00 via INTRAVENOUS

## 2015-09-10 NOTE — Progress Notes (Signed)
[facility] OFFICE PROGRESS NOTE  Patient Care Team: Idelle Crouch, MD as PCP - General (Internal Medicine)  HPI  SUMMARY of HEMATOLOGIC HISTORY:  #  ITP s/p Splenectomy [2007]; POOR RESPONSE to Prednisone; WinRho; Rituxan; RESPONSIVE to IVIG  # OCT 2015- START N-PLATE- LABILE RESPONSE  INTERVAL HISTORY:   Patient returns to clinic today for further evaluation and repeat laboratory work. She continues to feel fatigued, but otherwise feels well. She does not complain of easy bleeding or bruising today. She denies any fevers. She has no chest pain or shortness of breath. She denies any nausea, vomiting, constipation, or diarrhea. She does not have any melena or hematochezia. Patient offers no further specific complaints today.   REVIEW OF SYSTEMS:  10 point review of systems was done with the patient and are negative.  I have reviewed the past medical history, past surgical history, social history and family history with the patient and they are unchanged from previous note unless stated above.  ALLERGIES:  is allergic to aspirin; diazepam; morphine; other; and tetanus toxoids.  MEDICATIONS:  Current Outpatient Prescriptions  Medication Sig Dispense Refill  . acetaminophen (TYLENOL) 325 MG tablet Take 2 tablets (650 mg total) by mouth every 6 (six) hours as needed for mild pain (or Fever >/= 101). 100 tablet 0  . albuterol (PROVENTIL HFA;VENTOLIN HFA) 108 (90 BASE) MCG/ACT inhaler Inhale 2 puffs into the lungs every 6 (six) hours as needed for wheezing or shortness of breath.    . BUDESONIDE-FORMOTEROL FUMARATE IN Inhale 2 puffs into the lungs 2 (two) times daily as needed (for shortness of breath and wheezing.).     Marland Kitchen buPROPion (WELLBUTRIN XL) 150 MG 24 hr tablet Take by mouth.    . busPIRone (BUSPAR) 15 MG tablet Take by mouth.    . Calcium Carbonate-Vitamin D (CALCIUM 600+D) 600-400 MG-UNIT per tablet Take 1 tablet by mouth daily.    . citalopram (CELEXA) 40 MG tablet  Take 40 mg by mouth daily.    . clotrimazole-betamethasone (LOTRISONE) cream Apply 1 application topically daily as needed (for rash.).     Marland Kitchen Cyanocobalamin (RA VITAMIN B-12 TR) 1000 MCG TBCR Take 1,000 mcg by mouth daily.     Marland Kitchen docusate sodium (COLACE) 100 MG capsule Take 1 capsule (100 mg total) by mouth 2 (two) times daily as needed for mild constipation. 10 capsule 0  . donepezil (ARICEPT) 5 MG tablet Take 5 mg by mouth at bedtime.     Marland Kitchen eltrombopag (PROMACTA) 50 MG tablet Take 50 mg by mouth daily. Take on an empty stomach 1 hour before a meal or 2 hours after    . gabapentin (NEURONTIN) 300 MG capsule 300mg  orally 4 times a day    . HYDROcodone-acetaminophen (NORCO/VICODIN) 5-325 MG per tablet Take 1 tab by mouth every 4-6 hours as needed for pain    . levothyroxine (SYNTHROID, LEVOTHROID) 50 MCG tablet Take 50 mcg by mouth daily before breakfast. Take 30 to 60 minutes before breakfast.    . memantine (NAMENDA) 5 MG tablet Take by mouth.    . Multiple Vitamins-Minerals (OCUVITE-LUTEIN PO) Take 1 capsule by mouth daily.     . ondansetron (ZOFRAN) 4 MG tablet Take 1 tablet (4 mg total) by mouth every 6 (six) hours as needed for nausea. 20 tablet 0  . oxybutynin (DITROPAN) 5 MG tablet Take by mouth.    . pantoprazole (PROTONIX) 20 MG tablet Take by mouth.    . pravastatin (PRAVACHOL) 20  MG tablet Take 20 mg by mouth daily.    Marland Kitchen senna (SENOKOT) 8.6 MG TABS tablet Take 1 tablet (8.6 mg total) by mouth daily as needed for mild constipation. 120 each 0  . traMADol (ULTRAM) 50 MG tablet Take 50 mg by mouth 3 (three) times daily as needed for moderate pain or severe pain. pain    . venlafaxine XR (EFFEXOR-XR) 37.5 MG 24 hr capsule Take 1 capsule by mouth daily.     . vitamin E (E-400) 400 UNIT capsule Take 400 Units by mouth daily.    Marland Kitchen zolpidem (AMBIEN) 5 MG tablet Take 5 mg by mouth at bedtime as needed for sleep.     No current facility-administered medications for this visit.    PHYSICAL  EXAMINATION:   BP 163/59 mmHg  Pulse 59  Temp(Src) 96 F (35.6 C) (Tympanic)  Resp 16  Wt 147 lb 0.8 oz (66.7 kg)  Filed Weights   08/26/15 1411  Weight: 147 lb 0.8 oz (66.7 kg)    General: Well-developed, well-nourished, no acute distress. Eyes: Pink conjunctiva, anicteric sclera. Lungs: Clear to auscultation bilaterally. Heart: Regular rate and rhythm. No rubs, murmurs, or gallops. Abdomen: Soft, nontender, nondistended. No organomegaly noted, normoactive bowel sounds. Musculoskeletal: No edema, cyanosis, or clubbing. Neuro: Alert, answering all questions appropriately. Cranial nerves grossly intact. Skin: Several ecchymosis in various degrees of healing noted.  Psych: Normal affect.  LABORATORY DATA:  I have reviewed the data as listed    Component Value Date/Time   NA 135 07/23/2015 1719   NA 143 06/13/2014 1428   K 3.0* 07/23/2015 1719   K 4.2 06/13/2014 1428   CL 107 07/23/2015 1719   CL 105 06/13/2014 1428   CO2 25 07/23/2015 1719   CO2 31 06/13/2014 1428   GLUCOSE 90 07/23/2015 1719   GLUCOSE 70 06/13/2014 1428   BUN 12 07/23/2015 1719   BUN 15 06/13/2014 1428   CREATININE 0.81 07/26/2015 1053   CREATININE 1.05 06/13/2014 1428   CALCIUM 8.8* 07/23/2015 1719   CALCIUM 8.5 06/13/2014 1428   PROT 8.1 07/26/2015 1053   PROT 6.5 06/01/2014 1109   ALBUMIN 3.8 07/26/2015 1053   ALBUMIN 3.4 06/01/2014 1109   AST 38 07/26/2015 1053   AST 31 06/01/2014 1109   ALT 30 07/26/2015 1053   ALT 76* 06/01/2014 1109   ALKPHOS 68 07/26/2015 1053   ALKPHOS 93 06/01/2014 1109   BILITOT 0.6 07/26/2015 1053   BILITOT 0.3 06/01/2014 1109   GFRNONAA >60 07/26/2015 1053   GFRNONAA 51* 06/13/2014 1428   GFRAA >60 07/26/2015 1053   GFRAA 59* 06/13/2014 1428    No results found for: SPEP, UPEP  Lab Results  Component Value Date   WBC 9.1 09/10/2015   NEUTROABS 3.1 09/02/2015   HGB 12.6 09/10/2015   HCT 37.8 09/10/2015   MCV 92.7 09/10/2015   PLT 192 09/10/2015       Chemistry      Component Value Date/Time   NA 135 07/23/2015 1719   NA 143 06/13/2014 1428   K 3.0* 07/23/2015 1719   K 4.2 06/13/2014 1428   CL 107 07/23/2015 1719   CL 105 06/13/2014 1428   CO2 25 07/23/2015 1719   CO2 31 06/13/2014 1428   BUN 12 07/23/2015 1719   BUN 15 06/13/2014 1428   CREATININE 0.81 07/26/2015 1053   CREATININE 1.05 06/13/2014 1428      Component Value Date/Time   CALCIUM 8.8* 07/23/2015 1719   CALCIUM  8.5 06/13/2014 1428   ALKPHOS 68 07/26/2015 1053   ALKPHOS 93 06/01/2014 1109   AST 38 07/26/2015 1053   AST 31 06/01/2014 1109   ALT 30 07/26/2015 1053   ALT 76* 06/01/2014 1109   BILITOT 0.6 07/26/2015 1053   BILITOT 0.3 06/01/2014 1109       RADIOGRAPHIC STUDIES: I have personally reviewed the radiological images as listed and agreed with the findings in the report. No results found.   ASSESSMENT & PLAN:   1.  Chronic refractory ITP:  Patient had a poor response to  Prednisone, WinRho, and Rituxan. She was previously on Nplate and her response was labile at best.  She responds to IVIG, although this is not very durable only lasting approximately 3 weeks. Patient's platelet count is now within normal limits. Return to clinic in 1 and 2 weeks for laboratory work and then in 3 weeks for consideration of additional IVIG. Previously, Promacta was discussed and has been ordered. Patient will initiate this treatment once it is approved by insurance to assess if there is an improved more stable response.   Patient understands she can return to clinic at any time if she has any questions, concerns, or complaints.      Lloyd Huger, MD 09/10/2015 5:01 PM

## 2015-09-10 NOTE — H&P (Addendum)
Outpatient short stay form Pre-procedure 09/10/2015 10:06 AM Lollie Sails MD  Primary Physician: Dr. Fulton Reek  Reason for visit:  EGD  History of present illness:  Patient is a 81 year old female presenting today for an EGD. She had an episode of some black stools several weeks ago. She has a 3 also some epigastric abdominal pain. She has a history of thrombocytopenia and is under the care of Dr. Grayland Ormond for this issue. He was just recently treated for that and platelet count today was 192. She has seen no further black stools. He has a long-term history of dysphagia ever recent barium swallow was normal with the exception of finding multiple cervical osteophytes impinging on the posterior of the esophagus. A sizing tablet did not hang. She feels somewhat better in this regard after having started on Protonix.    Current facility-administered medications:  .  0.9 %  sodium chloride infusion, , Intravenous, Continuous, Lollie Sails, MD, Last Rate: 20 mL/hr at 09/10/15 0834 .  0.9 %  sodium chloride infusion, , Intravenous, Continuous, Lollie Sails, MD  Prescriptions prior to admission  Medication Sig Dispense Refill Last Dose  . oxybutynin (DITROPAN) 5 MG tablet Take by mouth.   09/10/2015 at 0700  . acetaminophen (TYLENOL) 325 MG tablet Take 2 tablets (650 mg total) by mouth every 6 (six) hours as needed for mild pain (or Fever >/= 101). 100 tablet 0 Taking  . albuterol (PROVENTIL HFA;VENTOLIN HFA) 108 (90 BASE) MCG/ACT inhaler Inhale 2 puffs into the lungs every 6 (six) hours as needed for wheezing or shortness of breath.   Taking  . BUDESONIDE-FORMOTEROL FUMARATE IN Inhale 2 puffs into the lungs 2 (two) times daily as needed (for shortness of breath and wheezing.).    Taking  . buPROPion (WELLBUTRIN XL) 150 MG 24 hr tablet Take by mouth.   Taking  . busPIRone (BUSPAR) 15 MG tablet Take by mouth.   Taking  . Calcium Carbonate-Vitamin D (CALCIUM 600+D) 600-400 MG-UNIT  per tablet Take 1 tablet by mouth daily.   Taking  . citalopram (CELEXA) 40 MG tablet Take 40 mg by mouth daily.   Taking  . clotrimazole-betamethasone (LOTRISONE) cream Apply 1 application topically daily as needed (for rash.).    Taking  . Cyanocobalamin (RA VITAMIN B-12 TR) 1000 MCG TBCR Take 1,000 mcg by mouth daily.    Taking  . docusate sodium (COLACE) 100 MG capsule Take 1 capsule (100 mg total) by mouth 2 (two) times daily as needed for mild constipation. 10 capsule 0 Taking  . donepezil (ARICEPT) 5 MG tablet Take 5 mg by mouth at bedtime.    Taking  . eltrombopag (PROMACTA) 50 MG tablet Take 50 mg by mouth daily. Take on an empty stomach 1 hour before a meal or 2 hours after   Taking  . gabapentin (NEURONTIN) 300 MG capsule 300mg  orally 4 times a day   Taking  . HYDROcodone-acetaminophen (NORCO/VICODIN) 5-325 MG per tablet Take 1 tab by mouth every 4-6 hours as needed for pain   Taking  . levothyroxine (SYNTHROID, LEVOTHROID) 50 MCG tablet Take 50 mcg by mouth daily before breakfast. Take 30 to 60 minutes before breakfast.   Taking  . memantine (NAMENDA) 5 MG tablet Take by mouth.   Taking  . Multiple Vitamins-Minerals (OCUVITE-LUTEIN PO) Take 1 capsule by mouth daily.    Taking  . ondansetron (ZOFRAN) 4 MG tablet Take 1 tablet (4 mg total) by mouth every 6 (six) hours as  needed for nausea. 20 tablet 0 Taking  . pantoprazole (PROTONIX) 20 MG tablet Take by mouth.   Taking  . pravastatin (PRAVACHOL) 20 MG tablet Take 20 mg by mouth daily.   Taking  . senna (SENOKOT) 8.6 MG TABS tablet Take 1 tablet (8.6 mg total) by mouth daily as needed for mild constipation. 120 each 0 Taking  . traMADol (ULTRAM) 50 MG tablet Take 50 mg by mouth 3 (three) times daily as needed for moderate pain or severe pain. pain   Taking  . venlafaxine XR (EFFEXOR-XR) 37.5 MG 24 hr capsule Take 1 capsule by mouth daily.    Taking  . vitamin E (E-400) 400 UNIT capsule Take 400 Units by mouth daily.   Taking  . zolpidem  (AMBIEN) 5 MG tablet Take 5 mg by mouth at bedtime as needed for sleep.   Taking     Allergies  Allergen Reactions  . Aspirin     Other reaction(s): Distress (finding)  . Diazepam     Other reaction(s): Itching of Skin sneezing  . Morphine     Other reaction(s): Itching of Skin  . Other     Other reaction(s): Unknown seasonal allergies  . Tetanus Toxoids     Other reaction(s): Localized superficial swelling of skin     Past Medical History  Diagnosis Date  . Hypercholesteremia   . ITP (idiopathic thrombocytopenic purpura)   . Restless legs   . Anemia   . Depression   . Osteoarthritis   . GERD (gastroesophageal reflux disease)   . Hiatal hernia   . Chronic back pain   . Asthma   . Osteoporosis   . Depression   . Anxiety   . Pulmonary emboli (HCC)     Review of systems:      Physical Exam    Heart and lungs: Regular rate and rhythm without rub or gallop, lungs are bilaterally clear.    HEENT: Normocephalic atraumatic eyes are anicteric.    Other:     Pertinant exam for procedure: Soft nontender nondistended bowel sounds positive and normoactive.    Planned proceedures: EGD and indicated procedures.    Lollie Sails, MD Gastroenterology 09/10/2015  10:06 AM      I have discussed the risks benefits and complications of procedures to include not limited to bleeding, infection, perforation and the risk of sedation and the patient wishes to proceed.

## 2015-09-10 NOTE — Anesthesia Preprocedure Evaluation (Signed)
Anesthesia Evaluation  Patient identified by MRN, date of birth, ID band Patient awake    Reviewed: Allergy & Precautions, H&P , NPO status , Patient's Chart, lab work & pertinent test results, reviewed documented beta blocker date and time   Airway Mallampati: II  TM Distance: >3 FB Neck ROM: full    Dental no notable dental hx.    Pulmonary neg pulmonary ROS, shortness of breath, asthma ,    Pulmonary exam normal breath sounds clear to auscultation       Cardiovascular Exercise Tolerance: Good hypertension, negative cardio ROS   Rhythm:regular Rate:Normal     Neuro/Psych PSYCHIATRIC DISORDERS  Neuromuscular disease negative neurological ROS  negative psych ROS   GI/Hepatic negative GI ROS, Neg liver ROS, hiatal hernia, GERD  ,  Endo/Other  negative endocrine ROSHypothyroidism   Renal/GU negative Renal ROS  negative genitourinary   Musculoskeletal   Abdominal   Peds  Hematology negative hematology ROS (+) anemia ,   Anesthesia Other Findings   Reproductive/Obstetrics negative OB ROS                             Anesthesia Physical Anesthesia Plan  ASA: III  Anesthesia Plan: General   Post-op Pain Management:    Induction:   Airway Management Planned:   Additional Equipment:   Intra-op Plan:   Post-operative Plan:   Informed Consent: I have reviewed the patients History and Physical, chart, labs and discussed the procedure including the risks, benefits and alternatives for the proposed anesthesia with the patient or authorized representative who has indicated his/her understanding and acceptance.   Dental Advisory Given  Plan Discussed with: CRNA  Anesthesia Plan Comments:         Anesthesia Quick Evaluation

## 2015-09-10 NOTE — Op Note (Signed)
[facility] Gastroenterology Patient Name: Ariana [name] Procedure Date: 09/10/2015 10:02 AM MRN: [id number] Account #: [id number] Date of Birth: 04/27/1933 Admit Type: Outpatient Age: 81 Room: [facility] ENDO ROOM 3 Gender: Female Note Status: Finalized Procedure:         Upper GI endoscopy Indications:       Epigastric abdominal pain, Heme positive stool, Melena Providers:         Lollie Sails, MD Referring MD:      Leonie Douglas. Doy Hutching, MD (Referring MD) Medicines:         Monitored Anesthesia Care Complications:     No immediate complications. Procedure:         Pre-Anesthesia Assessment:                    - ASA Grade Assessment: III - A patient with severe                     systemic disease.                    After obtaining informed consent, the endoscope was passed                     under direct vision. Throughout the procedure, the                     patient's blood pressure, pulse, and oxygen saturations                     were monitored continuously. The Endoscope was introduced                     through the mouth, and advanced to the third part of                     duodenum. The upper GI endoscopy was accomplished without                     difficulty. The patient tolerated the procedure well. Findings:      The examined esophagus was normal.      Patchy mild inflammation characterized by congestion (edema), erosions,       erythema and linear erosions was found in the gastric body and in the       gastric antrum.      The cardia and gastric fundus were normal on retroflexion.      The examined duodenum was normal.      A single 2 mm sessile polyp with no bleeding and no stigmata of recent       bleeding was found on the greater curvature of the stomach. Biopsies       were taken with a cold forceps for histology. Impression:        - Normal esophagus.                    - Erosive gastritis.                    - Normal examined  duodenum.                    - No specimens collected. Recommendation:    - Discharge patient to home.                    - Use  Protonix (pantoprazole) 40 mg PO daily daily. Procedure Code(s): --- Professional ---                    [phone], Esophagogastroduodenoscopy, flexible, transoral;                     with biopsy, single or multiple Diagnosis Code(s): --- Professional ---                    K29.60, Other gastritis without bleeding                    R10.13, Epigastric pain                    R19.5, Other fecal abnormalities                    K92.1, Melena CPT copyright 2014 American Medical Association. All rights reserved. The codes documented in this report are preliminary and upon coder review may  be revised to meet current compliance requirements. Lollie Sails, MD 09/10/2015 10:28:47 AM This report has been signed electronically. Number of Addenda: 0 Note Initiated On: 09/10/2015 10:02 AM      [facility]

## 2015-09-10 NOTE — Transfer of Care (Signed)
Immediate Anesthesia Transfer of Care Note  Patient: Ariana [name]  Procedure(s) Performed: Procedure(s): ESOPHAGOGASTRODUODENOSCOPY (EGD) WITH PROPOFOL (N/A)  Patient Location: PACU  Anesthesia Type:General  Level of Consciousness: awake, alert , oriented and patient cooperative  Airway & Oxygen Therapy: Patient Spontanous Breathing and Patient connected to nasal cannula oxygen  Post-op Assessment: Report given to RN and Post -op Vital signs reviewed and stable  Post vital signs: Reviewed and stable  Last Vitals:  Filed Vitals:   09/10/15 1030 09/10/15 1031  BP: 111/45 111/45  Pulse: 62 61  Temp: 36 C 36 C  Resp: 15 15    Complications: No apparent anesthesia complications

## 2015-09-11 ENCOUNTER — Inpatient Hospital Stay: Payer: Commercial Managed Care - HMO

## 2015-09-11 ENCOUNTER — Encounter: Payer: Self-pay | Admitting: Gastroenterology

## 2015-09-11 DIAGNOSIS — Z79899 Other long term (current) drug therapy: Secondary | ICD-10-CM | POA: Diagnosis not present

## 2015-09-11 DIAGNOSIS — R5383 Other fatigue: Secondary | ICD-10-CM | POA: Diagnosis not present

## 2015-09-11 DIAGNOSIS — D693 Immune thrombocytopenic purpura: Secondary | ICD-10-CM | POA: Diagnosis not present

## 2015-09-11 DIAGNOSIS — Z9081 Acquired absence of spleen: Secondary | ICD-10-CM | POA: Diagnosis not present

## 2015-09-11 DIAGNOSIS — R531 Weakness: Secondary | ICD-10-CM | POA: Diagnosis not present

## 2015-09-11 LAB — CBC WITH DIFFERENTIAL/PLATELET
BASOS PCT: 1 %
Basophils Absolute: 0.1 10*3/uL (ref 0–0.1)
Eosinophils Absolute: 0.1 10*3/uL (ref 0–0.7)
Eosinophils Relative: 1 %
HEMATOCRIT: 38.5 % (ref 35.0–47.0)
HEMOGLOBIN: 12.8 g/dL (ref 12.0–16.0)
LYMPHS ABS: 2.1 10*3/uL (ref 1.0–3.6)
Lymphocytes Relative: 28 %
MCH: 30.9 pg (ref 26.0–34.0)
MCHC: 33.2 g/dL (ref 32.0–36.0)
MCV: 93.2 fL (ref 80.0–100.0)
MONOS PCT: 8 %
Monocytes Absolute: 0.6 10*3/uL (ref 0.2–0.9)
NEUTROS ABS: 4.7 10*3/uL (ref 1.4–6.5)
NEUTROS PCT: 62 %
Platelets: 173 10*3/uL (ref 150–440)
RBC: 4.13 MIL/uL (ref 3.80–5.20)
RDW: 16.1 % — ABNORMAL HIGH (ref 11.5–14.5)
WBC: 7.6 10*3/uL (ref 3.6–11.0)

## 2015-09-11 NOTE — Anesthesia Postprocedure Evaluation (Signed)
Anesthesia Post Note  Patient: Al Noury  Procedure(s) Performed: Procedure(s) (LRB): ESOPHAGOGASTRODUODENOSCOPY (EGD) WITH PROPOFOL (N/A)  Patient location during evaluation: PACU Anesthesia Type: General Level of consciousness: awake and alert Pain management: pain level controlled Vital Signs Assessment: post-procedure vital signs reviewed and stable Respiratory status: spontaneous breathing, nonlabored ventilation, respiratory function stable and patient connected to nasal cannula oxygen Cardiovascular status: blood pressure returned to baseline and stable Postop Assessment: no signs of nausea or vomiting Anesthetic complications: no    Last Vitals:  Filed Vitals:   09/10/15 1050 09/10/15 1100  BP: 133/68 122/90  Pulse: 61 65  Temp:    Resp: 14 19    Last Pain: There were no vitals filed for this visit.               Molli Barrows

## 2015-09-12 LAB — SURGICAL PATHOLOGY

## 2015-09-16 ENCOUNTER — Other Ambulatory Visit: Payer: Commercial Managed Care - HMO

## 2015-09-16 ENCOUNTER — Ambulatory Visit: Payer: Commercial Managed Care - HMO

## 2015-09-16 ENCOUNTER — Ambulatory Visit: Payer: Commercial Managed Care - HMO | Admitting: Oncology

## 2015-09-16 ENCOUNTER — Inpatient Hospital Stay: Payer: Commercial Managed Care - HMO

## 2015-09-16 ENCOUNTER — Inpatient Hospital Stay: Payer: Commercial Managed Care - HMO | Admitting: Oncology

## 2015-09-17 ENCOUNTER — Inpatient Hospital Stay: Payer: Commercial Managed Care - HMO

## 2015-09-17 ENCOUNTER — Ambulatory Visit: Payer: Commercial Managed Care - HMO | Admitting: Oncology

## 2015-09-17 ENCOUNTER — Inpatient Hospital Stay (HOSPITAL_BASED_OUTPATIENT_CLINIC_OR_DEPARTMENT_OTHER): Payer: Commercial Managed Care - HMO | Admitting: Oncology

## 2015-09-17 ENCOUNTER — Ambulatory Visit: Payer: Commercial Managed Care - HMO

## 2015-09-17 ENCOUNTER — Inpatient Hospital Stay: Payer: Commercial Managed Care - HMO | Attending: Oncology

## 2015-09-17 ENCOUNTER — Other Ambulatory Visit: Payer: Commercial Managed Care - HMO

## 2015-09-17 VITALS — BP 163/80 | HR 59 | Resp 20

## 2015-09-17 VITALS — BP 172/76 | HR 54 | Temp 97.2°F | Resp 18 | Wt 143.1 lb

## 2015-09-17 DIAGNOSIS — Z79899 Other long term (current) drug therapy: Secondary | ICD-10-CM | POA: Insufficient documentation

## 2015-09-17 DIAGNOSIS — R531 Weakness: Secondary | ICD-10-CM

## 2015-09-17 DIAGNOSIS — Z9081 Acquired absence of spleen: Secondary | ICD-10-CM

## 2015-09-17 DIAGNOSIS — D693 Immune thrombocytopenic purpura: Secondary | ICD-10-CM | POA: Diagnosis not present

## 2015-09-17 DIAGNOSIS — R5383 Other fatigue: Secondary | ICD-10-CM | POA: Insufficient documentation

## 2015-09-17 DIAGNOSIS — D696 Thrombocytopenia, unspecified: Secondary | ICD-10-CM

## 2015-09-17 DIAGNOSIS — R233 Spontaneous ecchymoses: Secondary | ICD-10-CM | POA: Insufficient documentation

## 2015-09-17 LAB — CBC WITH DIFFERENTIAL/PLATELET
BASOS ABS: 0.1 10*3/uL (ref 0–0.1)
Basophils Relative: 1 %
Eosinophils Absolute: 0.1 10*3/uL (ref 0–0.7)
Eosinophils Relative: 1 %
HEMATOCRIT: 38.9 % (ref 35.0–47.0)
HEMOGLOBIN: 12.9 g/dL (ref 12.0–16.0)
LYMPHS PCT: 22 %
Lymphs Abs: 1.8 10*3/uL (ref 1.0–3.6)
MCH: 30.5 pg (ref 26.0–34.0)
MCHC: 33.1 g/dL (ref 32.0–36.0)
MCV: 92.1 fL (ref 80.0–100.0)
Monocytes Absolute: 0.6 10*3/uL (ref 0.2–0.9)
Monocytes Relative: 8 %
NEUTROS ABS: 5.6 10*3/uL (ref 1.4–6.5)
Neutrophils Relative %: 68 %
PLATELETS: 50 10*3/uL — AB (ref 150–440)
RBC: 4.22 MIL/uL (ref 3.80–5.20)
RDW: 14.8 % — ABNORMAL HIGH (ref 11.5–14.5)
WBC: 8.2 10*3/uL (ref 3.6–11.0)

## 2015-09-17 MED ORDER — DIPHENHYDRAMINE HCL 25 MG PO TABS
25.0000 mg | ORAL_TABLET | Freq: Every day | ORAL | Status: DC
  Administered 2015-09-17: 25 mg via ORAL
  Filled 2015-09-17: qty 1

## 2015-09-17 MED ORDER — SODIUM CHLORIDE 0.9 % IV SOLN
Freq: Once | INTRAVENOUS | Status: AC
  Administered 2015-09-17: 11:00:00 via INTRAVENOUS
  Filled 2015-09-17: qty 1000

## 2015-09-17 MED ORDER — ACETAMINOPHEN 325 MG PO TABS
650.0000 mg | ORAL_TABLET | Freq: Every day | ORAL | Status: DC
  Administered 2015-09-17: 650 mg via ORAL
  Filled 2015-09-17: qty 2

## 2015-09-17 MED ORDER — IMMUNE GLOBULIN (HUMAN) 10 GM/100ML IV SOLN
0.5000 g/kg | Freq: Every day | INTRAVENOUS | Status: DC
  Administered 2015-09-17: 30 g via INTRAVENOUS
  Filled 2015-09-17: qty 300

## 2015-09-17 NOTE — Progress Notes (Signed)
Patient is feeling more fatigued. 

## 2015-09-18 ENCOUNTER — Ambulatory Visit: Payer: Commercial Managed Care - HMO

## 2015-09-18 ENCOUNTER — Inpatient Hospital Stay: Payer: Commercial Managed Care - HMO

## 2015-09-18 VITALS — BP 138/79 | HR 57 | Temp 98.5°F | Resp 18

## 2015-09-18 DIAGNOSIS — D696 Thrombocytopenia, unspecified: Secondary | ICD-10-CM

## 2015-09-18 DIAGNOSIS — Z9081 Acquired absence of spleen: Secondary | ICD-10-CM | POA: Diagnosis not present

## 2015-09-18 DIAGNOSIS — R531 Weakness: Secondary | ICD-10-CM | POA: Diagnosis not present

## 2015-09-18 DIAGNOSIS — Z79899 Other long term (current) drug therapy: Secondary | ICD-10-CM | POA: Diagnosis not present

## 2015-09-18 DIAGNOSIS — R233 Spontaneous ecchymoses: Secondary | ICD-10-CM | POA: Diagnosis not present

## 2015-09-18 DIAGNOSIS — D693 Immune thrombocytopenic purpura: Secondary | ICD-10-CM

## 2015-09-18 DIAGNOSIS — R5383 Other fatigue: Secondary | ICD-10-CM | POA: Diagnosis not present

## 2015-09-18 MED ORDER — IMMUNE GLOBULIN (HUMAN) 10 GM/100ML IV SOLN
30.0000 g | Freq: Once | INTRAVENOUS | Status: AC
  Administered 2015-09-18: 30 g via INTRAVENOUS
  Filled 2015-09-18: qty 300

## 2015-09-18 MED ORDER — DEXTROSE 5 % IV SOLN
INTRAVENOUS | Status: DC
  Administered 2015-09-18: 09:00:00 via INTRAVENOUS
  Filled 2015-09-18: qty 1000

## 2015-09-18 MED ORDER — ACETAMINOPHEN 325 MG PO TABS
650.0000 mg | ORAL_TABLET | Freq: Four times a day (QID) | ORAL | Status: DC | PRN
  Administered 2015-09-18: 650 mg via ORAL
  Filled 2015-09-18: qty 2

## 2015-09-18 MED ORDER — DIPHENHYDRAMINE HCL 25 MG PO CAPS
ORAL_CAPSULE | ORAL | Status: AC
Start: 2015-09-18 — End: 2015-09-18
  Filled 2015-09-18: qty 1

## 2015-09-18 MED ORDER — SODIUM CHLORIDE 0.9 % IV SOLN
Freq: Once | INTRAVENOUS | Status: DC
  Filled 2015-09-18: qty 1000

## 2015-09-18 MED ORDER — DIPHENHYDRAMINE HCL 25 MG PO TABS
25.0000 mg | ORAL_TABLET | Freq: Once | ORAL | Status: AC
  Administered 2015-09-18: 25 mg via ORAL
  Filled 2015-09-18: qty 1

## 2015-09-24 ENCOUNTER — Inpatient Hospital Stay: Payer: Commercial Managed Care - HMO

## 2015-09-24 DIAGNOSIS — Z79899 Other long term (current) drug therapy: Secondary | ICD-10-CM | POA: Diagnosis not present

## 2015-09-24 DIAGNOSIS — R5383 Other fatigue: Secondary | ICD-10-CM | POA: Diagnosis not present

## 2015-09-24 DIAGNOSIS — D693 Immune thrombocytopenic purpura: Secondary | ICD-10-CM

## 2015-09-24 DIAGNOSIS — R531 Weakness: Secondary | ICD-10-CM | POA: Diagnosis not present

## 2015-09-24 DIAGNOSIS — Z9081 Acquired absence of spleen: Secondary | ICD-10-CM | POA: Diagnosis not present

## 2015-09-24 DIAGNOSIS — R233 Spontaneous ecchymoses: Secondary | ICD-10-CM | POA: Diagnosis not present

## 2015-09-24 LAB — CBC WITH DIFFERENTIAL/PLATELET
BASOS ABS: 0.1 10*3/uL (ref 0–0.1)
BASOS PCT: 1 %
EOS ABS: 0.1 10*3/uL (ref 0–0.7)
EOS PCT: 1 %
HCT: 41.5 % (ref 35.0–47.0)
Hemoglobin: 13.9 g/dL (ref 12.0–16.0)
Lymphocytes Relative: 18 %
Lymphs Abs: 2 10*3/uL (ref 1.0–3.6)
MCH: 31 pg (ref 26.0–34.0)
MCHC: 33.6 g/dL (ref 32.0–36.0)
MCV: 92.5 fL (ref 80.0–100.0)
MONO ABS: 0.7 10*3/uL (ref 0.2–0.9)
Monocytes Relative: 6 %
Neutro Abs: 8 10*3/uL — ABNORMAL HIGH (ref 1.4–6.5)
Neutrophils Relative %: 74 %
PLATELETS: 185 10*3/uL (ref 150–440)
RBC: 4.49 MIL/uL (ref 3.80–5.20)
RDW: 15.5 % — AB (ref 11.5–14.5)
WBC: 10.8 10*3/uL (ref 3.6–11.0)

## 2015-09-26 DIAGNOSIS — R1310 Dysphagia, unspecified: Secondary | ICD-10-CM | POA: Diagnosis not present

## 2015-09-26 DIAGNOSIS — K210 Gastro-esophageal reflux disease with esophagitis: Secondary | ICD-10-CM | POA: Diagnosis not present

## 2015-10-01 ENCOUNTER — Inpatient Hospital Stay: Payer: Commercial Managed Care - HMO

## 2015-10-01 DIAGNOSIS — Z79899 Other long term (current) drug therapy: Secondary | ICD-10-CM | POA: Diagnosis not present

## 2015-10-01 DIAGNOSIS — R233 Spontaneous ecchymoses: Secondary | ICD-10-CM | POA: Diagnosis not present

## 2015-10-01 DIAGNOSIS — R531 Weakness: Secondary | ICD-10-CM | POA: Diagnosis not present

## 2015-10-01 DIAGNOSIS — Z9081 Acquired absence of spleen: Secondary | ICD-10-CM | POA: Diagnosis not present

## 2015-10-01 DIAGNOSIS — D693 Immune thrombocytopenic purpura: Secondary | ICD-10-CM | POA: Diagnosis not present

## 2015-10-01 DIAGNOSIS — R5383 Other fatigue: Secondary | ICD-10-CM | POA: Diagnosis not present

## 2015-10-01 LAB — CBC WITH DIFFERENTIAL/PLATELET
Basophils Absolute: 0.1 10*3/uL (ref 0–0.1)
Basophils Relative: 1 %
Eosinophils Absolute: 0.1 10*3/uL (ref 0–0.7)
HEMATOCRIT: 39.6 % (ref 35.0–47.0)
HEMOGLOBIN: 13.1 g/dL (ref 12.0–16.0)
LYMPHS ABS: 1.9 10*3/uL (ref 1.0–3.6)
MCH: 31.2 pg (ref 26.0–34.0)
MCHC: 33.1 g/dL (ref 32.0–36.0)
MCV: 94.2 fL (ref 80.0–100.0)
Monocytes Absolute: 0.6 10*3/uL (ref 0.2–0.9)
Monocytes Relative: 7 %
Neutro Abs: 5.8 10*3/uL (ref 1.4–6.5)
Platelets: 27 10*3/uL — CL (ref 150–440)
RBC: 4.2 MIL/uL (ref 3.80–5.20)
RDW: 15.4 % — ABNORMAL HIGH (ref 11.5–14.5)
WBC: 8.5 10*3/uL (ref 3.6–11.0)

## 2015-10-01 NOTE — Progress Notes (Signed)
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 She continues to feel weak and fatigued, but otherwise feels well. She does not complain of easy bleeding or bruising today. She denies any fevers. She has no chest pain or shortness of breath. She denies any nausea, vomiting, constipation, or diarrhea. She does not have any melena or hematochezia. Patient offers no further specific complaints today.   REVIEW OF SYSTEMS:  10 point review of systems was done with the patient and are negative.  I have reviewed the past medical history, past surgical history, social history and family history with the patient and they are unchanged from previous note unless stated above.  ALLERGIES:  is allergic to aspirin; diazepam; morphine; other; and tetanus toxoids.  MEDICATIONS:  Current Outpatient Prescriptions  Medication Sig Dispense Refill  . acetaminophen (TYLENOL) 325 MG tablet Take 2 tablets (650 mg total) by mouth every 6 (six) hours as needed for mild pain (or Fever >/= 101). 100 tablet 0  . albuterol (PROVENTIL HFA;VENTOLIN HFA) 108 (90 BASE) MCG/ACT inhaler Inhale 2 puffs into the lungs every 6 (six) hours as needed for wheezing or shortness of breath.    . ALPRAZolam (XANAX) 0.25 MG tablet TAKE 1 TABLET TWICE DAILY AS NEEDED FOR SLEEP    . BUDESONIDE-FORMOTEROL FUMARATE IN Inhale 2 puffs into the lungs 2 (two) times daily as needed (for shortness of breath and wheezing.).     Marland Kitchen buPROPion (WELLBUTRIN XL) 150 MG 24 hr tablet Take by mouth.    . busPIRone (BUSPAR) 15 MG tablet Take by mouth.    . Calcium Carbonate-Vitamin D (CALCIUM 600+D)  600-400 MG-UNIT per tablet Take 1 tablet by mouth daily.    . citalopram (CELEXA) 40 MG tablet Take 40 mg by mouth daily.    . clotrimazole-betamethasone (LOTRISONE) cream Apply 1 application topically daily as needed (for rash.).     Marland Kitchen Cyanocobalamin (RA VITAMIN B-12 TR) 1000 MCG TBCR Take 1,000 mcg by mouth daily.     Marland Kitchen docusate sodium (COLACE) 100 MG capsule Take 1 capsule (100 mg total) by mouth 2 (two) times daily as needed for mild constipation. 10 capsule 0  . donepezil (ARICEPT) 5 MG tablet Take 5 mg by mouth at bedtime.     Marland Kitchen eltrombopag (PROMACTA) 50 MG tablet Take 50 mg by mouth daily. Take on an empty stomach 1 hour before a meal or 2 hours after    . gabapentin (NEURONTIN) 300 MG capsule 300mg  orally 4 times a day    . HYDROcodone-acetaminophen (NORCO/VICODIN) 5-325 MG per tablet Take 1 tab by mouth every 4-6 hours as needed for pain    . levothyroxine (SYNTHROID, LEVOTHROID) 50 MCG tablet Take 50 mcg by mouth daily before breakfast. Take 30 to 60 minutes before breakfast.    . memantine (NAMENDA) 5 MG tablet Take by mouth.    . Multiple Vitamins-Minerals (OCUVITE-LUTEIN PO) Take 1 capsule by mouth daily.     . ondansetron (ZOFRAN) 4 MG tablet Take 1 tablet (4 mg total) by mouth every 6 (six) hours as needed for nausea. 20 tablet 0  . oxybutynin (DITROPAN) 5 MG tablet Take by  mouth.    . pantoprazole (PROTONIX) 20 MG tablet Take by mouth.    . pravastatin (PRAVACHOL) 20 MG tablet Take 20 mg by mouth daily.    Marland Kitchen senna (SENOKOT) 8.6 MG TABS tablet Take 1 tablet (8.6 mg total) by mouth daily as needed for mild constipation. 120 each 0  . traMADol (ULTRAM) 50 MG tablet Take 50 mg by mouth 3 (three) times daily as needed for moderate pain or severe pain. pain    . venlafaxine XR (EFFEXOR-XR) 37.5 MG 24 hr capsule Take 1 capsule by mouth daily.     . vitamin E (E-400) 400 UNIT capsule Take 400 Units by mouth daily.    Marland Kitchen zolpidem (AMBIEN) 5 MG tablet Take 5 mg by mouth at bedtime as needed  for sleep.     No current facility-administered medications for this visit.    PHYSICAL EXAMINATION:   BP 172/76 mmHg  Pulse 54  Temp(Src) 97.2 F (36.2 C) (Tympanic)  Resp 18  Wt 143 lb 1.3 oz (64.9 kg)  Filed Weights   09/17/15 0925  Weight: 143 lb 1.3 oz (64.9 kg)    General: Well-developed, well-nourished, no acute distress. Eyes: Pink conjunctiva, anicteric sclera. Lungs: Clear to auscultation bilaterally. Heart: Regular rate and rhythm. No rubs, murmurs, or gallops. Abdomen: Soft, nontender, nondistended. No organomegaly noted, normoactive bowel sounds. Musculoskeletal: No edema, cyanosis, or clubbing. Neuro: Alert, answering all questions appropriately. Cranial nerves grossly intact. Skin: Several ecchymosis in various degrees of healing noted.  Psych: Normal affect.  LABORATORY DATA:  I have reviewed the data as listed    Component Value Date/Time   NA 135 07/23/2015 1719   NA 143 06/13/2014 1428   K 3.0* 07/23/2015 1719   K 4.2 06/13/2014 1428   CL 107 07/23/2015 1719   CL 105 06/13/2014 1428   CO2 25 07/23/2015 1719   CO2 31 06/13/2014 1428   GLUCOSE 90 07/23/2015 1719   GLUCOSE 70 06/13/2014 1428   BUN 12 07/23/2015 1719   BUN 15 06/13/2014 1428   CREATININE 0.81 07/26/2015 1053   CREATININE 1.05 06/13/2014 1428   CALCIUM 8.8* 07/23/2015 1719   CALCIUM 8.5 06/13/2014 1428   PROT 8.1 07/26/2015 1053   PROT 6.5 06/01/2014 1109   ALBUMIN 3.8 07/26/2015 1053   ALBUMIN 3.4 06/01/2014 1109   AST 38 07/26/2015 1053   AST 31 06/01/2014 1109   ALT 30 07/26/2015 1053   ALT 76* 06/01/2014 1109   ALKPHOS 68 07/26/2015 1053   ALKPHOS 93 06/01/2014 1109   BILITOT 0.6 07/26/2015 1053   BILITOT 0.3 06/01/2014 1109   GFRNONAA >60 07/26/2015 1053   GFRNONAA 51* 06/13/2014 1428   GFRAA >60 07/26/2015 1053   GFRAA 59* 06/13/2014 1428    No results found for: SPEP, UPEP  Lab Results  Component Value Date   WBC 10.8 09/24/2015   NEUTROABS 8.0* 09/24/2015    HGB 13.9 09/24/2015   HCT 41.5 09/24/2015   MCV 92.5 09/24/2015   PLT 185 09/24/2015      Chemistry      Component Value Date/Time   NA 135 07/23/2015 1719   NA 143 06/13/2014 1428   K 3.0* 07/23/2015 1719   K 4.2 06/13/2014 1428   CL 107 07/23/2015 1719   CL 105 06/13/2014 1428   CO2 25 07/23/2015 1719   CO2 31 06/13/2014 1428   BUN 12 07/23/2015 1719   BUN 15 06/13/2014 1428   CREATININE 0.81 07/26/2015 1053   CREATININE  1.05 06/13/2014 1428      Component Value Date/Time   CALCIUM 8.8* 07/23/2015 1719   CALCIUM 8.5 06/13/2014 1428   ALKPHOS 68 07/26/2015 1053   ALKPHOS 93 06/01/2014 1109   AST 38 07/26/2015 1053   AST 31 06/01/2014 1109   ALT 30 07/26/2015 1053   ALT 76* 06/01/2014 1109   BILITOT 0.6 07/26/2015 1053   BILITOT 0.3 06/01/2014 1109       RADIOGRAPHIC STUDIES: I have personally reviewed the radiological images as listed and agreed with the findings in the report. No results found.   ASSESSMENT & PLAN:   1.  Chronic refractory ITP:  Patient had a poor response to  Prednisone, WinRho, and Rituxan. She was previously on Nplate and her response was labile at best.  She responds to IVIG, although this is not very durable only lasting approximately 3 weeks. Patient's platelet count has dropped to 50 therefore will proceed with 500 mg/kg IVIG today and tomorrow. Return to clinic in 1 and 2 weeks for laboratory work and then in 3 weeks for consideration of additional IVIG. Previously, Antony Blackbird was discussed, but this was denied by insurance.   Patient understands she can return to clinic at any time if she has any questions, concerns, or complaints.      Lloyd Huger, MD 10/01/2015 8:22 AM

## 2015-10-02 ENCOUNTER — Inpatient Hospital Stay: Payer: Commercial Managed Care - HMO | Attending: Internal Medicine

## 2015-10-02 ENCOUNTER — Other Ambulatory Visit: Payer: Self-pay | Admitting: Oncology

## 2015-10-02 VITALS — BP 150/72 | HR 57 | Temp 96.4°F | Resp 18

## 2015-10-02 DIAGNOSIS — D693 Immune thrombocytopenic purpura: Secondary | ICD-10-CM | POA: Insufficient documentation

## 2015-10-02 DIAGNOSIS — Z79899 Other long term (current) drug therapy: Secondary | ICD-10-CM | POA: Diagnosis not present

## 2015-10-02 DIAGNOSIS — Z9081 Acquired absence of spleen: Secondary | ICD-10-CM | POA: Diagnosis not present

## 2015-10-02 DIAGNOSIS — D696 Thrombocytopenia, unspecified: Secondary | ICD-10-CM

## 2015-10-02 MED ORDER — IMMUNE GLOBULIN (HUMAN) 10 GM/100ML IV SOLN
30.0000 g | Freq: Once | INTRAVENOUS | Status: AC
  Administered 2015-10-02: 30 g via INTRAVENOUS
  Filled 2015-10-02: qty 300

## 2015-10-02 MED ORDER — DIPHENHYDRAMINE HCL 25 MG PO TABS
25.0000 mg | ORAL_TABLET | ORAL | Status: DC | PRN
  Administered 2015-10-02: 25 mg via ORAL
  Filled 2015-10-02: qty 1

## 2015-10-02 MED ORDER — ACETAMINOPHEN 325 MG PO TABS
650.0000 mg | ORAL_TABLET | Freq: Four times a day (QID) | ORAL | Status: DC | PRN
  Administered 2015-10-02: 650 mg via ORAL
  Filled 2015-10-02: qty 2

## 2015-10-02 MED ORDER — SODIUM CHLORIDE 0.9 % IV SOLN
Freq: Once | INTRAVENOUS | Status: AC
  Administered 2015-10-02: 10:00:00 via INTRAVENOUS
  Filled 2015-10-02: qty 1000

## 2015-10-03 ENCOUNTER — Inpatient Hospital Stay: Payer: Commercial Managed Care - HMO

## 2015-10-03 VITALS — BP 149/67 | HR 53 | Temp 96.2°F | Resp 18

## 2015-10-03 DIAGNOSIS — Z9081 Acquired absence of spleen: Secondary | ICD-10-CM | POA: Diagnosis not present

## 2015-10-03 DIAGNOSIS — D693 Immune thrombocytopenic purpura: Secondary | ICD-10-CM | POA: Diagnosis not present

## 2015-10-03 DIAGNOSIS — Z79899 Other long term (current) drug therapy: Secondary | ICD-10-CM | POA: Diagnosis not present

## 2015-10-03 DIAGNOSIS — D696 Thrombocytopenia, unspecified: Secondary | ICD-10-CM

## 2015-10-03 MED ORDER — HEPARIN SOD (PORK) LOCK FLUSH 100 UNIT/ML IV SOLN: 500.0000 [IU] | Freq: Once | INTRAVENOUS | Status: DC | PRN

## 2015-10-03 MED ORDER — ACETAMINOPHEN 325 MG PO TABS
650.0000 mg | ORAL_TABLET | Freq: Four times a day (QID) | ORAL | Status: DC | PRN
  Administered 2015-10-03: 650 mg via ORAL
  Filled 2015-10-03: qty 2

## 2015-10-03 MED ORDER — IMMUNE GLOBULIN (HUMAN) 10 GM/100ML IV SOLN
30.0000 g | Freq: Once | INTRAVENOUS | Status: AC
  Administered 2015-10-03: 30 g via INTRAVENOUS
  Filled 2015-10-03: qty 300

## 2015-10-03 MED ORDER — SODIUM CHLORIDE 0.9 % IJ SOLN
10.0000 mL | INTRAMUSCULAR | Status: DC | PRN
  Filled 2015-10-03: qty 10

## 2015-10-03 MED ORDER — HEPARIN SOD (PORK) LOCK FLUSH 100 UNIT/ML IV SOLN: 250.0000 [IU] | Freq: Once | INTRAVENOUS | Status: DC | PRN

## 2015-10-03 MED ORDER — SODIUM CHLORIDE 0.9 % IJ SOLN
3.0000 mL | Freq: Once | INTRAMUSCULAR | Status: DC | PRN
  Filled 2015-10-03: qty 10

## 2015-10-03 MED ORDER — SODIUM CHLORIDE 0.9 % IV SOLN
Freq: Once | INTRAVENOUS | Status: AC
  Administered 2015-10-03: 09:00:00 via INTRAVENOUS
  Filled 2015-10-03: qty 1000

## 2015-10-03 MED ORDER — IMMUNE GLOBULIN (HUMAN) 10 GM/100ML IV SOLN
30.0000 g | Freq: Once | INTRAVENOUS | Status: DC
  Filled 2015-10-03: qty 300

## 2015-10-03 MED ORDER — DIPHENHYDRAMINE HCL 25 MG PO TABS
25.0000 mg | ORAL_TABLET | ORAL | Status: DC | PRN
  Administered 2015-10-03: 25 mg via ORAL
  Filled 2015-10-03: qty 1

## 2015-10-03 MED ORDER — ALTEPLASE 2 MG IJ SOLR
2.0000 mg | Freq: Once | INTRAMUSCULAR | Status: DC | PRN
  Filled 2015-10-03: qty 2

## 2015-10-08 ENCOUNTER — Inpatient Hospital Stay (HOSPITAL_BASED_OUTPATIENT_CLINIC_OR_DEPARTMENT_OTHER): Payer: Commercial Managed Care - HMO | Admitting: Oncology

## 2015-10-08 ENCOUNTER — Inpatient Hospital Stay: Payer: Commercial Managed Care - HMO

## 2015-10-08 VITALS — BP 157/83 | HR 58 | Temp 97.7°F | Wt 143.3 lb

## 2015-10-08 DIAGNOSIS — Z9081 Acquired absence of spleen: Secondary | ICD-10-CM

## 2015-10-08 DIAGNOSIS — R233 Spontaneous ecchymoses: Secondary | ICD-10-CM | POA: Diagnosis not present

## 2015-10-08 DIAGNOSIS — Z79899 Other long term (current) drug therapy: Secondary | ICD-10-CM

## 2015-10-08 DIAGNOSIS — R5383 Other fatigue: Secondary | ICD-10-CM | POA: Diagnosis not present

## 2015-10-08 DIAGNOSIS — R0989 Other specified symptoms and signs involving the circulatory and respiratory systems: Secondary | ICD-10-CM

## 2015-10-08 DIAGNOSIS — R05 Cough: Secondary | ICD-10-CM

## 2015-10-08 DIAGNOSIS — D693 Immune thrombocytopenic purpura: Secondary | ICD-10-CM

## 2015-10-08 DIAGNOSIS — R531 Weakness: Secondary | ICD-10-CM

## 2015-10-08 LAB — CBC WITH DIFFERENTIAL/PLATELET
Basophils Absolute: 0.1 10*3/uL (ref 0–0.1)
Basophils Relative: 1 %
Eosinophils Absolute: 0.2 10*3/uL (ref 0–0.7)
Eosinophils Relative: 3 %
HEMATOCRIT: 39.6 % (ref 35.0–47.0)
HEMOGLOBIN: 13.2 g/dL (ref 12.0–16.0)
LYMPHS ABS: 2.1 10*3/uL (ref 1.0–3.6)
Lymphocytes Relative: 31 %
MCH: 31.2 pg (ref 26.0–34.0)
MCHC: 33.3 g/dL (ref 32.0–36.0)
MCV: 93.7 fL (ref 80.0–100.0)
MONOS PCT: 9 %
Monocytes Absolute: 0.6 10*3/uL (ref 0.2–0.9)
NEUTROS ABS: 3.8 10*3/uL (ref 1.4–6.5)
NEUTROS PCT: 56 %
Platelets: 129 10*3/uL — ABNORMAL LOW (ref 150–440)
RBC: 4.23 MIL/uL (ref 3.80–5.20)
RDW: 15.3 % — ABNORMAL HIGH (ref 11.5–14.5)
WBC: 6.8 10*3/uL (ref 3.6–11.0)

## 2015-10-08 NOTE — Progress Notes (Signed)
Patient's energy has not improved much since the infusion last week.  She was having chest congestion and her PCP started her on antibiotic.

## 2015-10-09 ENCOUNTER — Inpatient Hospital Stay: Payer: Commercial Managed Care - HMO

## 2015-10-10 ENCOUNTER — Ambulatory Visit: Payer: Commercial Managed Care - HMO

## 2015-10-10 ENCOUNTER — Other Ambulatory Visit: Payer: Self-pay | Admitting: Gastroenterology

## 2015-10-10 DIAGNOSIS — R1310 Dysphagia, unspecified: Secondary | ICD-10-CM

## 2015-10-14 NOTE — Progress Notes (Signed)
[place] OFFICE PROGRESS NOTE  Patient Care Team: Idelle Crouch, MD as PCP - General (Internal Medicine)  HPI  SUMMARY of HEMATOLOGIC HISTORY:  #  ITP s/p Splenectomy [2007]; POOR RESPONSE to Prednisone; WinRho; Rituxan; RESPONSIVE to IVIG  # OCT 2015- START N-PLATE- LABILE RESPONSE  INTERVAL HISTORY:   Patient returns to clinic today for further evaluation and repeat laboratory work. She required IVIG last week only 2 weeks after her previous infusion. Her platelets responded once again, but patient still feels weak and fatigued. Patient also had increased cough and congestion and was placed on an antibiotic by her primary care physician which may have contributed. She otherwise feels well. She does not complain of easy bleeding or bruising today. She denies any fevers. She has no chest pain or shortness of breath. She denies any nausea, vomiting, constipation, or diarrhea. She does not have any melena or hematochezia. Patient offers no further specific complaints today.   REVIEW OF SYSTEMS:  10 point review of systems was done with the patient and are negative.  I have reviewed the past medical history, past surgical history, social history and family history with the patient and they are unchanged from previous note unless stated above.  ALLERGIES:  is allergic to aspirin; diazepam; morphine; other; and tetanus toxoids.  MEDICATIONS:  Current Outpatient Prescriptions  Medication Sig Dispense Refill  . acetaminophen (TYLENOL) 325 MG tablet Take 2 tablets (650 mg total) by mouth every 6 (six) hours as needed for mild pain (or Fever >/= 101). 100 tablet 0  . albuterol (PROVENTIL HFA;VENTOLIN HFA) 108 (90 BASE) MCG/ACT inhaler Inhale 2 puffs into the lungs every 6 (six) hours as needed for wheezing or shortness of breath.    . ALPRAZolam (XANAX) 0.25 MG tablet TAKE 1 TABLET TWICE DAILY AS NEEDED FOR SLEEP    . BUDESONIDE-FORMOTEROL FUMARATE IN Inhale 2 puffs into the  lungs 2 (two) times daily as needed (for shortness of breath and wheezing.).     Marland Kitchen buPROPion (WELLBUTRIN XL) 150 MG 24 hr tablet Take by mouth.    . busPIRone (BUSPAR) 15 MG tablet Take by mouth.    . Calcium Carbonate-Vitamin D (CALCIUM 600+D) 600-400 MG-UNIT per tablet Take 1 tablet by mouth daily.    . citalopram (CELEXA) 40 MG tablet Take 40 mg by mouth daily.    . clotrimazole-betamethasone (LOTRISONE) cream Apply 1 application topically daily as needed (for rash.).     Marland Kitchen Cyanocobalamin (RA VITAMIN B-12 TR) 1000 MCG TBCR Take 1,000 mcg by mouth daily.     Marland Kitchen docusate sodium (COLACE) 100 MG capsule Take 1 capsule (100 mg total) by mouth 2 (two) times daily as needed for mild constipation. 10 capsule 0  . donepezil (ARICEPT) 5 MG tablet Take 5 mg by mouth at bedtime.     Marland Kitchen eltrombopag (PROMACTA) 50 MG tablet Take 50 mg by mouth daily. Take on an empty stomach 1 hour before a meal or 2 hours after    . gabapentin (NEURONTIN) 300 MG capsule 300mg  orally 4 times a day    . HYDROcodone-acetaminophen (NORCO/VICODIN) 5-325 MG per tablet Take 1 tab by mouth every 4-6 hours as needed for pain    . levothyroxine (SYNTHROID, LEVOTHROID) 50 MCG tablet Take 50 mcg by mouth daily before breakfast. Take 30 to 60 minutes before breakfast.    . memantine (NAMENDA) 5 MG tablet Take by mouth.    . Multiple Vitamins-Minerals (OCUVITE-LUTEIN PO) Take 1 capsule by mouth  daily.     . ondansetron (ZOFRAN) 4 MG tablet Take 1 tablet (4 mg total) by mouth every 6 (six) hours as needed for nausea. 20 tablet 0  . oxybutynin (DITROPAN) 5 MG tablet Take by mouth.    . pantoprazole (PROTONIX) 20 MG tablet Take by mouth.    . pravastatin (PRAVACHOL) 20 MG tablet Take 20 mg by mouth daily.    Marland Kitchen senna (SENOKOT) 8.6 MG TABS tablet Take 1 tablet (8.6 mg total) by mouth daily as needed for mild constipation. 120 each 0  . traMADol (ULTRAM) 50 MG tablet Take 50 mg by mouth 3 (three) times daily as needed for moderate pain or severe  pain. pain    . venlafaxine XR (EFFEXOR-XR) 37.5 MG 24 hr capsule Take 1 capsule by mouth daily.     . vitamin E (E-400) 400 UNIT capsule Take 400 Units by mouth daily.    Marland Kitchen zolpidem (AMBIEN) 5 MG tablet Take 5 mg by mouth at bedtime as needed for sleep.     No current facility-administered medications for this visit.    PHYSICAL EXAMINATION:   BP 157/83 mmHg  Pulse 58  Temp(Src) 97.7 F (36.5 C) (Tympanic)  Wt 143 lb 4.8 oz (65 kg)  Filed Weights   10/08/15 0847  Weight: 143 lb 4.8 oz (65 kg)    General: Well-developed, well-nourished, no acute distress. Eyes: Pink conjunctiva, anicteric sclera. Lungs: Clear to auscultation bilaterally. Heart: Regular rate and rhythm. No rubs, murmurs, or gallops. Abdomen: Soft, nontender, nondistended. No organomegaly noted, normoactive bowel sounds. Musculoskeletal: No edema, cyanosis, or clubbing. Neuro: Alert, answering all questions appropriately. Cranial nerves grossly intact. Skin: Several ecchymosis in various degrees of healing noted.  Psych: Normal affect.  LABORATORY DATA:  I have reviewed the data as listed    Component Value Date/Time   NA 135 07/23/2015 1719   NA 143 06/13/2014 1428   K 3.0* 07/23/2015 1719   K 4.2 06/13/2014 1428   CL 107 07/23/2015 1719   CL 105 06/13/2014 1428   CO2 25 07/23/2015 1719   CO2 31 06/13/2014 1428   GLUCOSE 90 07/23/2015 1719   GLUCOSE 70 06/13/2014 1428   BUN 12 07/23/2015 1719   BUN 15 06/13/2014 1428   CREATININE 0.81 07/26/2015 1053   CREATININE 1.05 06/13/2014 1428   CALCIUM 8.8* 07/23/2015 1719   CALCIUM 8.5 06/13/2014 1428   PROT 8.1 07/26/2015 1053   PROT 6.5 06/01/2014 1109   ALBUMIN 3.8 07/26/2015 1053   ALBUMIN 3.4 06/01/2014 1109   AST 38 07/26/2015 1053   AST 31 06/01/2014 1109   ALT 30 07/26/2015 1053   ALT 76* 06/01/2014 1109   ALKPHOS 68 07/26/2015 1053   ALKPHOS 93 06/01/2014 1109   BILITOT 0.6 07/26/2015 1053   BILITOT 0.3 06/01/2014 1109   GFRNONAA >60  07/26/2015 1053   GFRNONAA 51* 06/13/2014 1428   GFRAA >60 07/26/2015 1053   GFRAA 59* 06/13/2014 1428    No results found for: SPEP, UPEP  Lab Results  Component Value Date   WBC 6.8 10/08/2015   NEUTROABS 3.8 10/08/2015   HGB 13.2 10/08/2015   HCT 39.6 10/08/2015   MCV 93.7 10/08/2015   PLT 129* 10/08/2015      Chemistry      Component Value Date/Time   NA 135 07/23/2015 1719   NA 143 06/13/2014 1428   K 3.0* 07/23/2015 1719   K 4.2 06/13/2014 1428   CL 107 07/23/2015 1719   CL  105 06/13/2014 1428   CO2 25 07/23/2015 1719   CO2 31 06/13/2014 1428   BUN 12 07/23/2015 1719   BUN 15 06/13/2014 1428   CREATININE 0.81 07/26/2015 1053   CREATININE 1.05 06/13/2014 1428      Component Value Date/Time   CALCIUM 8.8* 07/23/2015 1719   CALCIUM 8.5 06/13/2014 1428   ALKPHOS 68 07/26/2015 1053   ALKPHOS 93 06/01/2014 1109   AST 38 07/26/2015 1053   AST 31 06/01/2014 1109   ALT 30 07/26/2015 1053   ALT 76* 06/01/2014 1109   BILITOT 0.6 07/26/2015 1053   BILITOT 0.3 06/01/2014 1109       RADIOGRAPHIC STUDIES: I have personally reviewed the radiological images as listed and agreed with the findings in the report. No results found.   ASSESSMENT & PLAN:   1.  Chronic refractory ITP:  Patient had a poor response to Prednisone, WinRho, and Rituxan. She is status post splenectomy in 2007. She was previously on Nplate and her response was labile at best.  She responds to IVIG, although this is not very durable only lasting approximately 3 weeks. Patient's platelet count has improved after receiving IVIG last week, although this time her infusion only lasted 2 weeks. She had increased cough and congestion and was put on an antibiotic that may have contributed. Return to clinic in 1 and 2 weeks for laboratory work and then in 3 weeks for consideration of additional IVIG. Previously, Antony Blackbird was discussed, but this was denied by insurance.  2. Cough/congestion: Patient has now  completed her in about a course. Monitor.  Patient understands she can return to clinic at any time if she has any questions, concerns, or complaints.      Lloyd Huger, MD 10/14/2015 8:56 AM

## 2015-10-15 ENCOUNTER — Inpatient Hospital Stay: Payer: Commercial Managed Care - HMO | Attending: Internal Medicine

## 2015-10-15 DIAGNOSIS — Z79899 Other long term (current) drug therapy: Secondary | ICD-10-CM | POA: Diagnosis not present

## 2015-10-15 DIAGNOSIS — Z886 Allergy status to analgesic agent status: Secondary | ICD-10-CM | POA: Insufficient documentation

## 2015-10-15 DIAGNOSIS — D693 Immune thrombocytopenic purpura: Secondary | ICD-10-CM

## 2015-10-15 DIAGNOSIS — R5383 Other fatigue: Secondary | ICD-10-CM | POA: Insufficient documentation

## 2015-10-15 DIAGNOSIS — R1310 Dysphagia, unspecified: Secondary | ICD-10-CM | POA: Insufficient documentation

## 2015-10-15 DIAGNOSIS — R531 Weakness: Secondary | ICD-10-CM | POA: Insufficient documentation

## 2015-10-15 LAB — CBC WITH DIFFERENTIAL/PLATELET
BASOS ABS: 0.1 10*3/uL (ref 0–0.1)
Basophils Relative: 1 %
EOS PCT: 1 %
Eosinophils Absolute: 0.1 10*3/uL (ref 0–0.7)
HCT: 41.8 % (ref 35.0–47.0)
Hemoglobin: 13.8 g/dL (ref 12.0–16.0)
LYMPHS PCT: 26 %
Lymphs Abs: 2.1 10*3/uL (ref 1.0–3.6)
MCH: 30.8 pg (ref 26.0–34.0)
MCHC: 33.1 g/dL (ref 32.0–36.0)
MCV: 93.3 fL (ref 80.0–100.0)
Monocytes Absolute: 0.4 10*3/uL (ref 0.2–0.9)
Monocytes Relative: 5 %
Neutro Abs: 5.5 10*3/uL (ref 1.4–6.5)
Neutrophils Relative %: 67 %
PLATELETS: 62 10*3/uL — AB (ref 150–440)
RBC: 4.49 MIL/uL (ref 3.80–5.20)
RDW: 15.3 % — ABNORMAL HIGH (ref 11.5–14.5)
WBC: 8.2 10*3/uL (ref 3.6–11.0)

## 2015-10-16 ENCOUNTER — Inpatient Hospital Stay: Payer: Commercial Managed Care - HMO

## 2015-10-16 ENCOUNTER — Other Ambulatory Visit: Payer: Self-pay | Admitting: Oncology

## 2015-10-16 VITALS — BP 142/65 | HR 65 | Temp 97.6°F

## 2015-10-16 DIAGNOSIS — D696 Thrombocytopenia, unspecified: Secondary | ICD-10-CM

## 2015-10-16 DIAGNOSIS — Z886 Allergy status to analgesic agent status: Secondary | ICD-10-CM | POA: Diagnosis not present

## 2015-10-16 DIAGNOSIS — R5383 Other fatigue: Secondary | ICD-10-CM | POA: Diagnosis not present

## 2015-10-16 DIAGNOSIS — Z79899 Other long term (current) drug therapy: Secondary | ICD-10-CM | POA: Diagnosis not present

## 2015-10-16 DIAGNOSIS — R1310 Dysphagia, unspecified: Secondary | ICD-10-CM | POA: Diagnosis not present

## 2015-10-16 DIAGNOSIS — D693 Immune thrombocytopenic purpura: Secondary | ICD-10-CM | POA: Diagnosis not present

## 2015-10-16 DIAGNOSIS — R531 Weakness: Secondary | ICD-10-CM | POA: Diagnosis not present

## 2015-10-16 MED ORDER — SODIUM CHLORIDE 0.9 % IV SOLN
Freq: Once | INTRAVENOUS | Status: AC
  Administered 2015-10-16: 11:00:00 via INTRAVENOUS
  Filled 2015-10-16: qty 1000

## 2015-10-16 MED ORDER — DIPHENHYDRAMINE HCL 25 MG PO CAPS
ORAL_CAPSULE | ORAL | Status: AC
  Filled 2015-10-16: qty 1

## 2015-10-16 MED ORDER — IMMUNE GLOBULIN (HUMAN) 10 GM/100ML IV SOLN
30.0000 g | Freq: Every day | INTRAVENOUS | Status: DC
  Administered 2015-10-16: 30 g via INTRAVENOUS
  Filled 2015-10-16: qty 300

## 2015-10-16 MED ORDER — DIPHENHYDRAMINE HCL 25 MG PO TABS
25.0000 mg | ORAL_TABLET | ORAL | Status: DC | PRN
  Administered 2015-10-16: 25 mg via ORAL
  Filled 2015-10-16: qty 1

## 2015-10-16 MED ORDER — ACETAMINOPHEN 325 MG PO TABS
650.0000 mg | ORAL_TABLET | Freq: Four times a day (QID) | ORAL | Status: DC | PRN
  Administered 2015-10-16: 650 mg via ORAL
  Filled 2015-10-16 (×2): qty 2

## 2015-10-17 ENCOUNTER — Ambulatory Visit: Payer: Commercial Managed Care - HMO

## 2015-10-17 ENCOUNTER — Inpatient Hospital Stay: Payer: Commercial Managed Care - HMO

## 2015-10-17 VITALS — BP 119/55 | HR 62 | Temp 97.1°F | Resp 18

## 2015-10-17 DIAGNOSIS — R531 Weakness: Secondary | ICD-10-CM | POA: Diagnosis not present

## 2015-10-17 DIAGNOSIS — R5383 Other fatigue: Secondary | ICD-10-CM | POA: Diagnosis not present

## 2015-10-17 DIAGNOSIS — R1310 Dysphagia, unspecified: Secondary | ICD-10-CM | POA: Diagnosis not present

## 2015-10-17 DIAGNOSIS — D693 Immune thrombocytopenic purpura: Secondary | ICD-10-CM

## 2015-10-17 DIAGNOSIS — D696 Thrombocytopenia, unspecified: Secondary | ICD-10-CM

## 2015-10-17 DIAGNOSIS — Z79899 Other long term (current) drug therapy: Secondary | ICD-10-CM | POA: Diagnosis not present

## 2015-10-17 DIAGNOSIS — Z886 Allergy status to analgesic agent status: Secondary | ICD-10-CM | POA: Diagnosis not present

## 2015-10-17 MED ORDER — DIPHENHYDRAMINE HCL 25 MG PO TABS
25.0000 mg | ORAL_TABLET | Freq: Once | ORAL | Status: AC
  Administered 2015-10-17: 25 mg via ORAL
  Filled 2015-10-17: qty 1

## 2015-10-17 MED ORDER — ACETAMINOPHEN 325 MG PO TABS
650.0000 mg | ORAL_TABLET | Freq: Four times a day (QID) | ORAL | Status: DC | PRN
  Administered 2015-10-17: 650 mg via ORAL
  Filled 2015-10-17: qty 2

## 2015-10-17 MED ORDER — DIPHENHYDRAMINE HCL 25 MG PO CAPS
ORAL_CAPSULE | ORAL | Status: AC
  Filled 2015-10-17: qty 1

## 2015-10-17 MED ORDER — IMMUNE GLOBULIN (HUMAN) 10 GM/100ML IV SOLN
30.0000 g | Freq: Once | INTRAVENOUS | Status: AC
  Administered 2015-10-17: 30 g via INTRAVENOUS
  Filled 2015-10-17: qty 300

## 2015-10-17 MED ORDER — SODIUM CHLORIDE 0.9 % IV SOLN
Freq: Once | INTRAVENOUS | Status: AC
  Administered 2015-10-17: 09:00:00 via INTRAVENOUS
  Filled 2015-10-17: qty 1000

## 2015-10-22 ENCOUNTER — Inpatient Hospital Stay: Payer: Commercial Managed Care - HMO

## 2015-10-22 ENCOUNTER — Ambulatory Visit: Admission: RE | Admit: 2015-10-22 | Payer: Commercial Managed Care - HMO | Source: Ambulatory Visit

## 2015-10-22 DIAGNOSIS — R5383 Other fatigue: Secondary | ICD-10-CM | POA: Diagnosis not present

## 2015-10-22 DIAGNOSIS — Z886 Allergy status to analgesic agent status: Secondary | ICD-10-CM | POA: Diagnosis not present

## 2015-10-22 DIAGNOSIS — Z79899 Other long term (current) drug therapy: Secondary | ICD-10-CM | POA: Diagnosis not present

## 2015-10-22 DIAGNOSIS — R531 Weakness: Secondary | ICD-10-CM | POA: Diagnosis not present

## 2015-10-22 DIAGNOSIS — R1310 Dysphagia, unspecified: Secondary | ICD-10-CM | POA: Diagnosis not present

## 2015-10-22 DIAGNOSIS — D693 Immune thrombocytopenic purpura: Secondary | ICD-10-CM

## 2015-10-22 LAB — CBC WITH DIFFERENTIAL/PLATELET
Basophils Absolute: 0.1 10*3/uL (ref 0–0.1)
Basophils Relative: 2 %
EOS ABS: 0.1 10*3/uL (ref 0–0.7)
EOS PCT: 1 %
HCT: 42 % (ref 35.0–47.0)
HEMOGLOBIN: 13.8 g/dL (ref 12.0–16.0)
LYMPHS ABS: 2 10*3/uL (ref 1.0–3.6)
Lymphocytes Relative: 25 %
MCH: 31 pg (ref 26.0–34.0)
MCHC: 32.9 g/dL (ref 32.0–36.0)
MCV: 94 fL (ref 80.0–100.0)
MONOS PCT: 6 %
Monocytes Absolute: 0.4 10*3/uL (ref 0.2–0.9)
NEUTROS PCT: 66 %
Neutro Abs: 5.3 10*3/uL (ref 1.4–6.5)
Platelets: 181 10*3/uL (ref 150–440)
RBC: 4.46 MIL/uL (ref 3.80–5.20)
RDW: 15.2 % — ABNORMAL HIGH (ref 11.5–14.5)
WBC: 8 10*3/uL (ref 3.6–11.0)

## 2015-10-23 ENCOUNTER — Inpatient Hospital Stay: Payer: Commercial Managed Care - HMO

## 2015-10-24 ENCOUNTER — Inpatient Hospital Stay: Payer: Commercial Managed Care - HMO

## 2015-10-28 ENCOUNTER — Ambulatory Visit
Admission: RE | Admit: 2015-10-28 | Discharge: 2015-10-28 | Disposition: A | Discharge status: Home / Self Care | Payer: Commercial Managed Care - HMO | Source: Ambulatory Visit | Attending: Gastroenterology | Admitting: Gastroenterology

## 2015-10-28 DIAGNOSIS — T17228D Food in pharynx causing other injury, subsequent encounter: Secondary | ICD-10-CM | POA: Diagnosis not present

## 2015-10-28 DIAGNOSIS — R1310 Dysphagia, unspecified: Secondary | ICD-10-CM | POA: Diagnosis not present

## 2015-10-28 DIAGNOSIS — R1312 Dysphagia, oropharyngeal phase: Secondary | ICD-10-CM

## 2015-10-28 NOTE — Therapy (Signed)
[facility]  [place], [place], [address] Phone: [phone]   Fax:     Modified Barium Swallow  Patient Details  Name: Ariana [name] MRN: [id number] Date of Birth: 1933-09-23 No Data Recorded  Encounter Date: 10/28/2015      End of Session - 10/28/15 1312    Visit Number 1   Number of Visits 1   Date for SLP Re-Evaluation 10/28/15   SLP Start Time 3   SLP Stop Time  1313   SLP Time Calculation (min) 43 min   Activity Tolerance Patient tolerated treatment well      Past Medical History  Diagnosis Date  . Hypercholesteremia   . ITP (idiopathic thrombocytopenic purpura)   . Restless legs   . Anemia   . Depression   . Osteoarthritis   . GERD (gastroesophageal reflux disease)   . Hiatal hernia   . Chronic back pain   . Asthma   . Osteoporosis   . Depression   . Anxiety   . Pulmonary emboli [facility])     Past Surgical History  Procedure Laterality Date  . Splenectomy, partial    . Total abdominal hysterectomy    . Lumbar disc surgery    . Carpal tunnel release    . Esophagogastroduodenoscopy (egd) with propofol N/A 09/10/2015    Procedure: ESOPHAGOGASTRODUODENOSCOPY (EGD) WITH PROPOFOL;  Surgeon: Lollie Sails, MD;  Location: [facility] ENDOSCOPY;  Service: Endoscopy;  Laterality: N/A;    There were no vitals filed for this visit.  Visit Diagnosis: Oropharyngeal dysphagia  Dysphagia - Plan: DG OP Swallowing Func-Medicare/Speech Path, DG OP Swallowing Func-Medicare/Speech Path     Subjective: Patient behavior: (alertness, ability to follow instructions, etc.): Patient abel to relay her swallowing history.  Chief complaint: "it just won't go down!   Objective:  Radiological Procedure: A videoflouroscopic evaluation of oral-preparatory, reflex initiation, and pharyngeal phases of the swallow was performed; as well as a screening of the upper esophageal phase.  I. POSTURE:  Upright in MBS chair  II. VIEW: Lateral  III. COMPENSATORY STRATEGIES: N/A  IV. BOLUSES ADMINISTERED:   Thin Liquid: 2 small sips, 3 rapid consecutive sips   Nectar-thick Liquid: 1 cup rim sip    Puree: 2 teaspoon   Mechanical Soft: 1/4 graham cracker in applesauce   V. RESULTS OF EVALUATION: A. ORAL PREPARATORY PHASE: (The lips, tongue, and velum are observed for strength and coordination) As the study progressed, the patient had more and more difficulty initiating and completing posterior transfer.  Otherwise, within normal limits.       **Overall Severity Rating: Minimal  B. SWALLOW INITIATION/REFLEX: (The reflex is normal if "triggered" by the time the bolus reached the base of the tongue) Within normal limits  **Overall Severity Rating:WNL  C. PHARYNGEAL PHASE: (Pharyngeal function is normal if the bolus shows rapid, smooth, and continuous transit through the pharynx and there is no pharyngeal residue after the swallow)  Within normal limits  **Overall Severity Rating: WNL  D. LARYNGEAL PENETRATION: (Material entering into the laryngeal inlet/vestibule but not aspirated) None  E. ASPIRATION: None  F. ESOPHAGEAL PHASE: (Screening of the upper esophagus) No observed abnormality through viewable cervical esophagus.  Swallows air with liquid swallows.  ASSESSMENT: 82 year old woman, with difficulty swallowing ("it just won't go down!), is presenting with minimal oropharyngeal swallowing.  Oral control of the bolus including oral hold, rotary mastication, and anterior to posterior transfer is within normal limits with beginning swallows.  As the study progressed, the patient had more and more difficulty initiating and completing posterior transfer.  Timing of the pharyngeal swallow is within normal limits.  Pharyngeal aspects of the swallow including hyolaryngeal excursion, pharyngeal pressure generation, epiglottic inversion, duration/amplitude of UES opening, and laryngeal vestibule  closure at the height of the swallow are within normal limits.  There is no observed pharyngeal residue, laryngeal penetration, or tracheal aspiration.  The patient's presentation today is suggestive of fear of swallowing with increasing muscle tension that impedes normal relaxed swallowing.  The patient was advised to try to relax while eating and drinking, that too much anticipation of difficulty may be inhibiting her swallow.  PLAN/RECOMMENDATIONS:  A. Diet:Regular   B. Swallowing Precautions: Reflux precautions, relax   C. Recommended consultation to N/A   D. Therapy recommendations N/A   E. Results and recommendations were discussed with the patient immediately following the study and the final report will be routed to referring practioner.            G-Codes - November 09, 2015 1313    Functional Assessment Tool Used MBSS, clinical judgment   Functional Limitations Swallowing   Swallow Current Status [id number]) At least 1 percent but less than 20 percent impaired, limited or restricted   Swallow Goal Status [id number]) At least 1 percent but less than 20 percent impaired, limited or restricted   Swallow Discharge Status [phone]) At least 1 percent but less than 20 percent impaired, limited or restricted          Problem List Patient Active Problem List   Diagnosis Date Noted  . Thrombocytopenia ([place]) 06/29/2015  . Clinical depression 04/12/2015  . Herpes zona 04/12/2015  . ITP (idiopathic thrombocytopenic purpura) 03/15/2015  . Chest pain 10/22/2014  . Breathlessness on exertion 10/22/2014  . Osteopenia 10/18/2014  . Benign essential HTN 06/28/2014  . HLD (hyperlipidemia) 06/28/2014  . Essential hemorrhagic thrombocythemia ([place]) 06/28/2014  . Acquired hypothyroidism 06/04/2014  . Anxiety and depression 06/04/2014  . Bursitis, trochanteric 03/27/2014  . DDD (degenerative disc disease), lumbar 02/27/2014  . Neuritis or radiculitis due to rupture of lumbar intervertebral disc  02/27/2014   Leroy Sea, MS/CCC- SLP  Lou Miner 09-Nov-2015, 1:14 PM  [facility] [place], [place], [address] Phone: [phone]   Fax:     Name: Ariana [name] MRN: [id number] Date of Birth: September 05, 1933

## 2015-10-29 ENCOUNTER — Encounter: Payer: Self-pay | Admitting: Oncology

## 2015-10-29 ENCOUNTER — Inpatient Hospital Stay: Payer: Commercial Managed Care - HMO

## 2015-10-29 ENCOUNTER — Inpatient Hospital Stay (HOSPITAL_BASED_OUTPATIENT_CLINIC_OR_DEPARTMENT_OTHER): Payer: Commercial Managed Care - HMO | Admitting: Oncology

## 2015-10-29 VITALS — BP 145/84 | HR 59 | Temp 96.3°F | Resp 18 | Ht 65.0 in | Wt 144.6 lb

## 2015-10-29 DIAGNOSIS — D693 Immune thrombocytopenic purpura: Secondary | ICD-10-CM

## 2015-10-29 DIAGNOSIS — R1310 Dysphagia, unspecified: Secondary | ICD-10-CM

## 2015-10-29 DIAGNOSIS — R531 Weakness: Secondary | ICD-10-CM | POA: Diagnosis not present

## 2015-10-29 DIAGNOSIS — R5383 Other fatigue: Secondary | ICD-10-CM | POA: Diagnosis not present

## 2015-10-29 DIAGNOSIS — D696 Thrombocytopenia, unspecified: Secondary | ICD-10-CM

## 2015-10-29 DIAGNOSIS — Z886 Allergy status to analgesic agent status: Secondary | ICD-10-CM | POA: Diagnosis not present

## 2015-10-29 DIAGNOSIS — Z79899 Other long term (current) drug therapy: Secondary | ICD-10-CM | POA: Diagnosis not present

## 2015-10-29 LAB — CBC WITH DIFFERENTIAL/PLATELET
BASOS ABS: 0.1 10*3/uL (ref 0–0.1)
Basophils Relative: 1 %
Eosinophils Absolute: 0.1 10*3/uL (ref 0–0.7)
Eosinophils Relative: 2 %
HEMATOCRIT: 41 % (ref 35.0–47.0)
Hemoglobin: 13.6 g/dL (ref 12.0–16.0)
LYMPHS ABS: 2.2 10*3/uL (ref 1.0–3.6)
LYMPHS PCT: 39 %
MCH: 31.1 pg (ref 26.0–34.0)
MCHC: 33.2 g/dL (ref 32.0–36.0)
MCV: 93.6 fL (ref 80.0–100.0)
MONO ABS: 0.5 10*3/uL (ref 0.2–0.9)
Monocytes Relative: 9 %
NEUTROS ABS: 2.8 10*3/uL (ref 1.4–6.5)
Neutrophils Relative %: 49 %
Platelets: 55 10*3/uL — ABNORMAL LOW (ref 150–440)
RBC: 4.38 MIL/uL (ref 3.80–5.20)
RDW: 15.4 % — AB (ref 11.5–14.5)
WBC: 5.7 10*3/uL (ref 3.6–11.0)

## 2015-10-29 MED ORDER — IMMUNE GLOBULIN (HUMAN) 10 GM/100ML IV SOLN
30.0000 g | Freq: Once | INTRAVENOUS | Status: AC
  Administered 2015-10-29: 30 g via INTRAVENOUS
  Filled 2015-10-29: qty 300

## 2015-10-29 MED ORDER — ACETAMINOPHEN 325 MG PO TABS
650.0000 mg | ORAL_TABLET | Freq: Four times a day (QID) | ORAL | Status: DC | PRN
  Administered 2015-10-29: 650 mg via ORAL
  Filled 2015-10-29: qty 2

## 2015-10-29 MED ORDER — SODIUM CHLORIDE 0.9 % IV SOLN
Freq: Once | INTRAVENOUS | Status: AC
  Administered 2015-10-29: 20 mL/h via INTRAVENOUS
  Filled 2015-10-29: qty 1000

## 2015-10-29 MED ORDER — DIPHENHYDRAMINE HCL 25 MG PO TABS
25.0000 mg | ORAL_TABLET | ORAL | Status: DC | PRN
  Administered 2015-10-29: 25 mg via ORAL
  Filled 2015-10-29: qty 1

## 2015-10-30 ENCOUNTER — Inpatient Hospital Stay: Payer: Commercial Managed Care - HMO

## 2015-10-30 VITALS — BP 128/70 | HR 60 | Temp 96.2°F | Resp 20

## 2015-10-30 DIAGNOSIS — D696 Thrombocytopenia, unspecified: Secondary | ICD-10-CM

## 2015-10-30 DIAGNOSIS — D693 Immune thrombocytopenic purpura: Secondary | ICD-10-CM

## 2015-10-30 DIAGNOSIS — R531 Weakness: Secondary | ICD-10-CM | POA: Diagnosis not present

## 2015-10-30 DIAGNOSIS — Z886 Allergy status to analgesic agent status: Secondary | ICD-10-CM | POA: Diagnosis not present

## 2015-10-30 DIAGNOSIS — Z79899 Other long term (current) drug therapy: Secondary | ICD-10-CM | POA: Diagnosis not present

## 2015-10-30 DIAGNOSIS — R1310 Dysphagia, unspecified: Secondary | ICD-10-CM | POA: Diagnosis not present

## 2015-10-30 DIAGNOSIS — R5383 Other fatigue: Secondary | ICD-10-CM | POA: Diagnosis not present

## 2015-10-30 MED ORDER — DIPHENHYDRAMINE HCL 25 MG PO TABS
25.0000 mg | ORAL_TABLET | ORAL | Status: DC | PRN
  Administered 2015-10-30: 25 mg via ORAL
  Filled 2015-10-30: qty 1

## 2015-10-30 MED ORDER — DIPHENHYDRAMINE HCL 25 MG PO CAPS
ORAL_CAPSULE | ORAL | Status: AC
  Filled 2015-10-30: qty 1

## 2015-10-30 MED ORDER — SODIUM CHLORIDE 0.9 % IV SOLN
Freq: Once | INTRAVENOUS | Status: AC
  Administered 2015-10-30: 10:00:00 via INTRAVENOUS
  Filled 2015-10-30: qty 1000

## 2015-10-30 MED ORDER — IMMUNE GLOBULIN (HUMAN) 10 GM/100ML IV SOLN
30.0000 g | Freq: Once | INTRAVENOUS | Status: AC
  Administered 2015-10-30: 30 g via INTRAVENOUS
  Filled 2015-10-30: qty 300

## 2015-10-30 MED ORDER — ACETAMINOPHEN 325 MG PO TABS
650.0000 mg | ORAL_TABLET | Freq: Four times a day (QID) | ORAL | Status: DC | PRN
  Administered 2015-10-30: 650 mg via ORAL
  Filled 2015-10-30: qty 2

## 2015-10-30 NOTE — Progress Notes (Signed)
[place] OFFICE PROGRESS NOTE  Patient Care Team: Idelle Crouch, MD as PCP - General (Internal Medicine)  HPI  SUMMARY of HEMATOLOGIC HISTORY:  #  ITP s/p Splenectomy [2007]; POOR RESPONSE to Prednisone; WinRho; Rituxan; RESPONSIVE to IVIG  # OCT 2015- START N-PLATE- LABILE RESPONSE  INTERVAL HISTORY:   Patient returns to clinic today for further evaluation and repeat laboratory work. She required IVIG only 2 weeks after her previous infusion. Her platelets responded once again, but patient still feels weak and fatigued.  She otherwise feels well. She does not complain of easy bleeding or bruising today. She denies any fevers. She has no chest pain or shortness of breath. She denies any nausea, vomiting, constipation, or diarrhea. She does not have any melena or hematochezia. Patient offers no further specific complaints today.   REVIEW OF SYSTEMS:  10 point review of systems was done with the patient and are negative.  I have reviewed the past medical history, past surgical history, social history and family history with the patient and they are unchanged from previous note unless stated above.  ALLERGIES:  is allergic to aspirin; diazepam; morphine; other; and tetanus toxoids.  MEDICATIONS:  Current Outpatient Prescriptions  Medication Sig Dispense Refill  . acetaminophen (TYLENOL) 325 MG tablet Take 2 tablets (650 mg total) by mouth every 6 (six) hours as needed for mild pain (or Fever >/= 101). 100 tablet 0  . albuterol (PROVENTIL HFA;VENTOLIN HFA) 108 (90 BASE) MCG/ACT inhaler Inhale 2 puffs into the lungs every 6 (six) hours as needed for wheezing or shortness of breath.    . ALPRAZolam (XANAX) 0.25 MG tablet TAKE 1 TABLET TWICE DAILY AS NEEDED FOR SLEEP    . BUDESONIDE-FORMOTEROL FUMARATE IN Inhale 2 puffs into the lungs 2 (two) times daily as needed (for shortness of breath and wheezing.).     Marland Kitchen buPROPion (WELLBUTRIN XL) 150 MG 24 hr tablet Take by mouth.     . busPIRone (BUSPAR) 15 MG tablet Take by mouth.    . Calcium Carbonate-Vitamin D (CALCIUM 600+D) 600-400 MG-UNIT per tablet Take 1 tablet by mouth daily.    . citalopram (CELEXA) 40 MG tablet Take 40 mg by mouth daily.    . Cyanocobalamin (RA VITAMIN B-12 TR) 1000 MCG TBCR Take 1,000 mcg by mouth daily.     Marland Kitchen docusate sodium (COLACE) 100 MG capsule Take 1 capsule (100 mg total) by mouth 2 (two) times daily as needed for mild constipation. 10 capsule 0  . donepezil (ARICEPT) 5 MG tablet Take 5 mg by mouth daily.    Marland Kitchen eltrombopag (PROMACTA) 50 MG tablet Take 50 mg by mouth daily. Take on an empty stomach 1 hour before a meal or 2 hours after    . gabapentin (NEURONTIN) 300 MG capsule 300mg  orally 4 times a day    . HYDROcodone-acetaminophen (NORCO/VICODIN) 5-325 MG per tablet Reported on 10/17/2015    . levothyroxine (SYNTHROID, LEVOTHROID) 50 MCG tablet Take 50 mcg by mouth daily before breakfast. Take 30 to 60 minutes before breakfast.    . memantine (NAMENDA) 5 MG tablet Take by mouth.    . Multiple Vitamins-Minerals (OCUVITE-LUTEIN PO) Take 1 capsule by mouth daily.     . ondansetron (ZOFRAN) 4 MG tablet Take 1 tablet (4 mg total) by mouth every 6 (six) hours as needed for nausea. 20 tablet 0  . oxybutynin (DITROPAN) 5 MG tablet Take by mouth.    . pantoprazole (PROTONIX) 20 MG tablet Take by  mouth.    . pravastatin (PRAVACHOL) 20 MG tablet Take 20 mg by mouth daily.    Marland Kitchen senna (SENOKOT) 8.6 MG TABS tablet Take 1 tablet (8.6 mg total) by mouth daily as needed for mild constipation. 120 each 0  . traMADol (ULTRAM) 50 MG tablet Take 50 mg by mouth 3 (three) times daily as needed for moderate pain or severe pain. pain    . venlafaxine XR (EFFEXOR-XR) 37.5 MG 24 hr capsule Take 1 capsule by mouth daily.     . vitamin E (E-400) 400 UNIT capsule Take 400 Units by mouth daily.    Marland Kitchen zolpidem (AMBIEN) 5 MG tablet Take 5 mg by mouth at bedtime as needed for sleep.    Marland Kitchen donepezil (ARICEPT) 5 MG tablet  Take 5 mg by mouth at bedtime.      No current facility-administered medications for this visit.   Facility-Administered Medications Ordered in Other Visits  Medication Dose Route Frequency Provider Last Rate Last Dose  . acetaminophen (TYLENOL) tablet 650 mg  650 mg Oral Q6H PRN Lloyd Huger, MD   650 mg at 10/30/15 1002  . diphenhydrAMINE (BENADRYL) tablet 25 mg  25 mg Oral Q4H PRN Lloyd Huger, MD   25 mg at 10/30/15 1002    PHYSICAL EXAMINATION:   BP 145/84 mmHg  Pulse 59  Temp(Src) 96.3 F (35.7 C)  Resp 18  Ht 5\' 5"  (1.651 m)  Wt 144 lb 10 oz (65.6 kg)  BMI 24.07 kg/m2  SpO2 97%  Filed Weights   10/29/15 0927  Weight: 144 lb 10 oz (65.6 kg)    General: Well-developed, well-nourished, no acute distress. Eyes: Pink conjunctiva, anicteric sclera. Lungs: Clear to auscultation bilaterally. Heart: Regular rate and rhythm. No rubs, murmurs, or gallops. Abdomen: Soft, nontender, nondistended. No organomegaly noted, normoactive bowel sounds. Musculoskeletal: No edema, cyanosis, or clubbing. Neuro: Alert, answering all questions appropriately. Cranial nerves grossly intact. Skin: Several ecchymosis in various degrees of healing noted.  Psych: Normal affect.  LABORATORY DATA:  I have reviewed the data as listed    Component Value Date/Time   NA 135 07/23/2015 1719   NA 143 06/13/2014 1428   K 3.0* 07/23/2015 1719   K 4.2 06/13/2014 1428   CL 107 07/23/2015 1719   CL 105 06/13/2014 1428   CO2 25 07/23/2015 1719   CO2 31 06/13/2014 1428   GLUCOSE 90 07/23/2015 1719   GLUCOSE 70 06/13/2014 1428   BUN 12 07/23/2015 1719   BUN 15 06/13/2014 1428   CREATININE 0.81 07/26/2015 1053   CREATININE 1.05 06/13/2014 1428   CALCIUM 8.8* 07/23/2015 1719   CALCIUM 8.5 06/13/2014 1428   PROT 8.1 07/26/2015 1053   PROT 6.5 06/01/2014 1109   ALBUMIN 3.8 07/26/2015 1053   ALBUMIN 3.4 06/01/2014 1109   AST 38 07/26/2015 1053   AST 31 06/01/2014 1109   ALT 30  07/26/2015 1053   ALT 76* 06/01/2014 1109   ALKPHOS 68 07/26/2015 1053   ALKPHOS 93 06/01/2014 1109   BILITOT 0.6 07/26/2015 1053   BILITOT 0.3 06/01/2014 1109   GFRNONAA >60 07/26/2015 1053   GFRNONAA 51* 06/13/2014 1428   GFRAA >60 07/26/2015 1053   GFRAA 59* 06/13/2014 1428    No results found for: SPEP, UPEP  Lab Results  Component Value Date   WBC 5.7 10/29/2015   NEUTROABS 2.8 10/29/2015   HGB 13.6 10/29/2015   HCT 41.0 10/29/2015   MCV 93.6 10/29/2015   PLT 55* 10/29/2015  Chemistry      Component Value Date/Time   NA 135 07/23/2015 1719   NA 143 06/13/2014 1428   K 3.0* 07/23/2015 1719   K 4.2 06/13/2014 1428   CL 107 07/23/2015 1719   CL 105 06/13/2014 1428   CO2 25 07/23/2015 1719   CO2 31 06/13/2014 1428   BUN 12 07/23/2015 1719   BUN 15 06/13/2014 1428   CREATININE 0.81 07/26/2015 1053   CREATININE 1.05 06/13/2014 1428      Component Value Date/Time   CALCIUM 8.8* 07/23/2015 1719   CALCIUM 8.5 06/13/2014 1428   ALKPHOS 68 07/26/2015 1053   ALKPHOS 93 06/01/2014 1109   AST 38 07/26/2015 1053   AST 31 06/01/2014 1109   ALT 30 07/26/2015 1053   ALT 76* 06/01/2014 1109   BILITOT 0.6 07/26/2015 1053   BILITOT 0.3 06/01/2014 1109       RADIOGRAPHIC STUDIES: I have personally reviewed the radiological images as listed and agreed with the findings in the report. Dg Op Swallowing Func-medicare/speech Path  10/28/2015  CLINICAL DATA:  Dysphagia, food getting stuck in the throat EXAM: MODIFIED BARIUM SWALLOW TECHNIQUE: Different consistencies of barium were administered orally to the patient by the Speech Pathologist. Imaging of the pharynx was performed in the lateral projection. FLUOROSCOPY TIME:  Radiation Exposure Index (as provided by the fluoroscopic device): 0.5 mGy COMPARISON:  None. FINDINGS: Thin liquid- within normal limits Nectar thick liquid- within normal limits Honey- within normal limits Pure- within normal limits Cracker-within normal  limits IMPRESSION: Modified barium swallow as described above. Please refer to the Speech Pathologists report for complete details and recommendations. Electronically Signed   By: Kathreen Devoid   On: 10/28/2015 13:24     ASSESSMENT & PLAN:   1.  Chronic refractory ITP:  Patient had a poor response to Prednisone, WinRho, and Rituxan. She is status post splenectomy in 2007. She was previously on Nplate and her response was labile at best.  She responds to IVIG, although this is not very durable only lasting now 2 weeks.  Proceed with IVIG today and tomorrow. Return to clinic in 1 week for laboratory work and then in 2 weeks for further evaluation and consideration of IVIG. Given the decreased responsiveness to IVIG, may want to consider Nplate again in the near future. Previously, Antony Blackbird was discussed, but this was denied by insurance.  2. Cough/congestion: Resolved.  Patient understands she can return to clinic at any time if she has any questions, concerns, or complaints.      Lloyd Huger, MD 10/30/2015 1:01 PM

## 2015-11-05 ENCOUNTER — Inpatient Hospital Stay: Payer: Commercial Managed Care - HMO

## 2015-11-05 DIAGNOSIS — Z79899 Other long term (current) drug therapy: Secondary | ICD-10-CM | POA: Diagnosis not present

## 2015-11-05 DIAGNOSIS — R531 Weakness: Secondary | ICD-10-CM | POA: Diagnosis not present

## 2015-11-05 DIAGNOSIS — D693 Immune thrombocytopenic purpura: Secondary | ICD-10-CM

## 2015-11-05 DIAGNOSIS — Z886 Allergy status to analgesic agent status: Secondary | ICD-10-CM | POA: Diagnosis not present

## 2015-11-05 DIAGNOSIS — R1310 Dysphagia, unspecified: Secondary | ICD-10-CM | POA: Diagnosis not present

## 2015-11-05 DIAGNOSIS — R5383 Other fatigue: Secondary | ICD-10-CM | POA: Diagnosis not present

## 2015-11-05 LAB — CBC WITH DIFFERENTIAL/PLATELET
Basophils Absolute: 0 10*3/uL (ref 0–0.1)
Basophils Relative: 1 %
Eosinophils Absolute: 0 10*3/uL (ref 0–0.7)
Eosinophils Relative: 1 %
HEMATOCRIT: 39.7 % (ref 35.0–47.0)
HEMOGLOBIN: 13.2 g/dL (ref 12.0–16.0)
LYMPHS ABS: 2.1 10*3/uL (ref 1.0–3.6)
LYMPHS PCT: 35 %
MCH: 31.2 pg (ref 26.0–34.0)
MCHC: 33.3 g/dL (ref 32.0–36.0)
MCV: 93.6 fL (ref 80.0–100.0)
MONOS PCT: 7 %
Monocytes Absolute: 0.4 10*3/uL (ref 0.2–0.9)
NEUTROS ABS: 3.4 10*3/uL (ref 1.4–6.5)
NEUTROS PCT: 57 %
Platelets: 144 10*3/uL — ABNORMAL LOW (ref 150–440)
RBC: 4.24 MIL/uL (ref 3.80–5.20)
RDW: 15.4 % — ABNORMAL HIGH (ref 11.5–14.5)
WBC: 6 10*3/uL (ref 3.6–11.0)

## 2015-11-12 ENCOUNTER — Inpatient Hospital Stay: Payer: Commercial Managed Care - HMO

## 2015-11-12 ENCOUNTER — Inpatient Hospital Stay (HOSPITAL_BASED_OUTPATIENT_CLINIC_OR_DEPARTMENT_OTHER): Payer: Commercial Managed Care - HMO | Admitting: Oncology

## 2015-11-12 VITALS — BP 166/70 | HR 58 | Temp 97.7°F | Resp 16 | Wt 148.6 lb

## 2015-11-12 DIAGNOSIS — R5383 Other fatigue: Secondary | ICD-10-CM

## 2015-11-12 DIAGNOSIS — D693 Immune thrombocytopenic purpura: Secondary | ICD-10-CM

## 2015-11-12 DIAGNOSIS — Z886 Allergy status to analgesic agent status: Secondary | ICD-10-CM

## 2015-11-12 DIAGNOSIS — E7800 Pure hypercholesterolemia, unspecified: Secondary | ICD-10-CM | POA: Diagnosis not present

## 2015-11-12 DIAGNOSIS — I10 Essential (primary) hypertension: Secondary | ICD-10-CM | POA: Diagnosis not present

## 2015-11-12 DIAGNOSIS — Z79899 Other long term (current) drug therapy: Secondary | ICD-10-CM

## 2015-11-12 DIAGNOSIS — R531 Weakness: Secondary | ICD-10-CM

## 2015-11-12 DIAGNOSIS — R1310 Dysphagia, unspecified: Secondary | ICD-10-CM | POA: Diagnosis not present

## 2015-11-12 DIAGNOSIS — D696 Thrombocytopenia, unspecified: Secondary | ICD-10-CM

## 2015-11-12 DIAGNOSIS — E039 Hypothyroidism, unspecified: Secondary | ICD-10-CM | POA: Diagnosis not present

## 2015-11-12 LAB — CBC WITH DIFFERENTIAL/PLATELET
BASOS PCT: 1 %
Basophils Absolute: 0.1 10*3/uL (ref 0–0.1)
EOS ABS: 0 10*3/uL (ref 0–0.7)
Eosinophils Relative: 0 %
HCT: 40.1 % (ref 35.0–47.0)
Hemoglobin: 13.5 g/dL (ref 12.0–16.0)
Lymphocytes Relative: 29 %
Lymphs Abs: 3 10*3/uL (ref 1.0–3.6)
MCH: 31.9 pg (ref 26.0–34.0)
MCHC: 33.7 g/dL (ref 32.0–36.0)
MCV: 94.6 fL (ref 80.0–100.0)
MONO ABS: 0.7 10*3/uL (ref 0.2–0.9)
MONOS PCT: 7 %
NEUTROS PCT: 63 %
Neutro Abs: 6.7 10*3/uL — ABNORMAL HIGH (ref 1.4–6.5)
PLATELETS: 58 10*3/uL — AB (ref 150–440)
RBC: 4.24 MIL/uL (ref 3.80–5.20)
RDW: 16 % — AB (ref 11.5–14.5)
WBC: 10.6 10*3/uL (ref 3.6–11.0)

## 2015-11-12 MED ORDER — SODIUM CHLORIDE 0.9 % IV SOLN
Freq: Once | INTRAVENOUS | Status: AC
  Administered 2015-11-12: 11:00:00 via INTRAVENOUS
  Filled 2015-11-12: qty 1000

## 2015-11-12 MED ORDER — SODIUM CHLORIDE 0.9 % IJ SOLN
3.0000 mL | Freq: Once | INTRAMUSCULAR | Status: DC | PRN
Start: 2015-11-12 — End: 2015-11-12
  Filled 2015-11-12: qty 10

## 2015-11-12 MED ORDER — SODIUM CHLORIDE 0.9 % IJ SOLN
10.0000 mL | INTRAMUSCULAR | Status: DC | PRN
  Filled 2015-11-12: qty 10

## 2015-11-12 MED ORDER — ALTEPLASE 2 MG IJ SOLR: 2.0000 mg | Freq: Once | INTRAMUSCULAR | Status: DC | PRN

## 2015-11-12 MED ORDER — IMMUNE GLOBULIN (HUMAN) 10 GM/100ML IV SOLN
30.0000 g | Freq: Once | INTRAVENOUS | Status: AC
  Administered 2015-11-12: 30 g via INTRAVENOUS
  Filled 2015-11-12: qty 300

## 2015-11-12 MED ORDER — DIPHENHYDRAMINE HCL 25 MG PO TABS
25.0000 mg | ORAL_TABLET | ORAL | Status: DC | PRN
  Administered 2015-11-12: 25 mg via ORAL
  Filled 2015-11-12: qty 1

## 2015-11-12 MED ORDER — ACETAMINOPHEN 325 MG PO TABS
650.0000 mg | ORAL_TABLET | Freq: Four times a day (QID) | ORAL | Status: DC | PRN
  Administered 2015-11-12: 650 mg via ORAL
  Filled 2015-11-12: qty 2

## 2015-11-12 MED ORDER — HEPARIN SOD (PORK) LOCK FLUSH 100 UNIT/ML IV SOLN: 250.0000 [IU] | Freq: Once | INTRAVENOUS | Status: DC | PRN

## 2015-11-12 MED ORDER — HEPARIN SOD (PORK) LOCK FLUSH 100 UNIT/ML IV SOLN: 500.0000 [IU] | Freq: Once | INTRAVENOUS | Status: DC | PRN

## 2015-11-12 NOTE — Progress Notes (Signed)
Patient is feeling very fatigued today.  She hit her arm on a door at home and peeled the skin back on her right arm.

## 2015-11-12 NOTE — Progress Notes (Signed)
[place] OFFICE PROGRESS NOTE  Patient Care Team: Idelle Crouch, MD as PCP - General (Internal Medicine)  HPI  SUMMARY of HEMATOLOGIC HISTORY:  #  ITP s/p Splenectomy [2007]; POOR RESPONSE to Prednisone; WinRho; Rituxan; RESPONSIVE to IVIG  # OCT 2015- START N-PLATE- LABILE RESPONSE  INTERVAL HISTORY:   Patient returns to clinic today for further evaluation and repeat laboratory work. She required IVIG only 2 weeks after her previous infusion. Her platelets responded once again, but patient still feels weak and fatigued.  She otherwise feels well. She does not complain of easy bleeding or bruising today. She denies any fevers. She has no chest pain or shortness of breath. She denies any nausea, vomiting, constipation, or diarrhea. She does not have any melena or hematochezia. Patient offers no further specific complaints today.   REVIEW OF SYSTEMS:  10 point review of systems was done with the patient and are negative.  I have reviewed the past medical history, past surgical history, social history and family history with the patient and they are unchanged from previous note unless stated above.  ALLERGIES:  is allergic to aspirin; diazepam; morphine; other; and tetanus toxoids.  MEDICATIONS:  Current Outpatient Prescriptions  Medication Sig Dispense Refill  . acetaminophen (TYLENOL) 325 MG tablet Take 2 tablets (650 mg total) by mouth every 6 (six) hours as needed for mild pain (or Fever >/= 101). 100 tablet 0  . albuterol (PROVENTIL HFA;VENTOLIN HFA) 108 (90 BASE) MCG/ACT inhaler Inhale 2 puffs into the lungs every 6 (six) hours as needed for wheezing or shortness of breath.    . ALPRAZolam (XANAX) 0.25 MG tablet TAKE 1 TABLET TWICE DAILY AS NEEDED FOR SLEEP    . BUDESONIDE-FORMOTEROL FUMARATE IN Inhale 2 puffs into the lungs 2 (two) times daily as needed (for shortness of breath and wheezing.).     Marland Kitchen buPROPion (WELLBUTRIN XL) 150 MG 24 hr tablet Take by mouth.     . busPIRone (BUSPAR) 15 MG tablet Take by mouth.    . Calcium Carbonate-Vitamin D (CALCIUM 600+D) 600-400 MG-UNIT per tablet Take 1 tablet by mouth daily.    . citalopram (CELEXA) 40 MG tablet Take 40 mg by mouth daily.    . Cyanocobalamin (RA VITAMIN B-12 TR) 1000 MCG TBCR Take 1,000 mcg by mouth daily.     Marland Kitchen docusate sodium (COLACE) 100 MG capsule Take 1 capsule (100 mg total) by mouth 2 (two) times daily as needed for mild constipation. 10 capsule 0  . donepezil (ARICEPT) 5 MG tablet Take 5 mg by mouth daily.    Marland Kitchen eltrombopag (PROMACTA) 50 MG tablet Take 50 mg by mouth daily. Take on an empty stomach 1 hour before a meal or 2 hours after    . gabapentin (NEURONTIN) 300 MG capsule 300mg  orally 4 times a day    . HYDROcodone-acetaminophen (NORCO/VICODIN) 5-325 MG per tablet Reported on 10/17/2015    . levothyroxine (SYNTHROID, LEVOTHROID) 50 MCG tablet Take 50 mcg by mouth daily before breakfast. Take 30 to 60 minutes before breakfast.    . memantine (NAMENDA) 5 MG tablet Take by mouth.    . Multiple Vitamins-Minerals (OCUVITE-LUTEIN PO) Take 1 capsule by mouth daily.     . ondansetron (ZOFRAN) 4 MG tablet Take 1 tablet (4 mg total) by mouth every 6 (six) hours as needed for nausea. 20 tablet 0  . oxybutynin (DITROPAN) 5 MG tablet Take by mouth.    . pantoprazole (PROTONIX) 20 MG tablet Take by  mouth.    . pravastatin (PRAVACHOL) 20 MG tablet Take 20 mg by mouth daily.    Marland Kitchen senna (SENOKOT) 8.6 MG TABS tablet Take 1 tablet (8.6 mg total) by mouth daily as needed for mild constipation. 120 each 0  . traMADol (ULTRAM) 50 MG tablet Take 50 mg by mouth 3 (three) times daily as needed for moderate pain or severe pain. pain    . venlafaxine XR (EFFEXOR-XR) 37.5 MG 24 hr capsule Take 1 capsule by mouth daily.     . vitamin E (E-400) 400 UNIT capsule Take 400 Units by mouth daily.    Marland Kitchen zolpidem (AMBIEN) 5 MG tablet Take 5 mg by mouth at bedtime as needed for sleep.     No current facility-administered  medications for this visit.    PHYSICAL EXAMINATION:   BP 166/70 mmHg  Pulse 58  Temp(Src) 97.7 F (36.5 C) (Tympanic)  Resp 16  Wt 148 lb 9.4 oz (67.4 kg)  Filed Weights   11/12/15 0946  Weight: 148 lb 9.4 oz (67.4 kg)    General: Well-developed, well-nourished, no acute distress. Eyes: Pink conjunctiva, anicteric sclera. Lungs: Clear to auscultation bilaterally. Heart: Regular rate and rhythm. No rubs, murmurs, or gallops. Abdomen: Soft, nontender, nondistended. No organomegaly noted, normoactive bowel sounds. Musculoskeletal: No edema, cyanosis, or clubbing. Neuro: Alert, answering all questions appropriately. Cranial nerves grossly intact. Skin: Several ecchymosis in various degrees of healing noted.  Psych: Normal affect.  LABORATORY DATA:  I have reviewed the data as listed    Component Value Date/Time   NA 135 07/23/2015 1719   NA 143 06/13/2014 1428   K 3.0* 07/23/2015 1719   K 4.2 06/13/2014 1428   CL 107 07/23/2015 1719   CL 105 06/13/2014 1428   CO2 25 07/23/2015 1719   CO2 31 06/13/2014 1428   GLUCOSE 90 07/23/2015 1719   GLUCOSE 70 06/13/2014 1428   BUN 12 07/23/2015 1719   BUN 15 06/13/2014 1428   CREATININE 0.81 07/26/2015 1053   CREATININE 1.05 06/13/2014 1428   CALCIUM 8.8* 07/23/2015 1719   CALCIUM 8.5 06/13/2014 1428   PROT 8.1 07/26/2015 1053   PROT 6.5 06/01/2014 1109   ALBUMIN 3.8 07/26/2015 1053   ALBUMIN 3.4 06/01/2014 1109   AST 38 07/26/2015 1053   AST 31 06/01/2014 1109   ALT 30 07/26/2015 1053   ALT 76* 06/01/2014 1109   ALKPHOS 68 07/26/2015 1053   ALKPHOS 93 06/01/2014 1109   BILITOT 0.6 07/26/2015 1053   BILITOT 0.3 06/01/2014 1109   GFRNONAA >60 07/26/2015 1053   GFRNONAA 51* 06/13/2014 1428   GFRAA >60 07/26/2015 1053   GFRAA 59* 06/13/2014 1428    No results found for: SPEP, UPEP  Lab Results  Component Value Date   WBC 10.6 11/12/2015   NEUTROABS 6.7* 11/12/2015   HGB 13.5 11/12/2015   HCT 40.1 11/12/2015    MCV 94.6 11/12/2015   PLT 58* 11/12/2015      Chemistry      Component Value Date/Time   NA 135 07/23/2015 1719   NA 143 06/13/2014 1428   K 3.0* 07/23/2015 1719   K 4.2 06/13/2014 1428   CL 107 07/23/2015 1719   CL 105 06/13/2014 1428   CO2 25 07/23/2015 1719   CO2 31 06/13/2014 1428   BUN 12 07/23/2015 1719   BUN 15 06/13/2014 1428   CREATININE 0.81 07/26/2015 1053   CREATININE 1.05 06/13/2014 1428      Component Value Date/Time  CALCIUM 8.8* 07/23/2015 1719   CALCIUM 8.5 06/13/2014 1428   ALKPHOS 68 07/26/2015 1053   ALKPHOS 93 06/01/2014 1109   AST 38 07/26/2015 1053   AST 31 06/01/2014 1109   ALT 30 07/26/2015 1053   ALT 76* 06/01/2014 1109   BILITOT 0.6 07/26/2015 1053   BILITOT 0.3 06/01/2014 1109       RADIOGRAPHIC STUDIES: I have personally reviewed the radiological images as listed and agreed with the findings in the report. No results found.   ASSESSMENT & PLAN:   1.  Chronic refractory ITP:  Patient had a poor response to Prednisone, WinRho, and Rituxan. She is status post splenectomy in 2007. She was previously on Nplate and her response was labile at best.  She responds to IVIG, although this is not very durable only lasting now 2 weeks. Proceed with 0.5mg /kg IVIG today and tomorrow. Will try to increase her IVIG dose to 0.75mg /kg at next infusion. Return to clinic in 1 week for laboratory work and then in 2 weeks for further evaluation and consideration of IVIG. Given the decreased responsiveness to IVIG, may want to consider Nplate again in the near future. Previously, Antony Blackbird was discussed, but this was denied by insurance.    Patient understands she can return to clinic at any time if she has any questions, concerns, or complaints.     Mayra Reel, NP 11/12/2015 10:00 AM  Patient was seen and evaluated independently and I agree with the assessment and plan as dictated above.  Lloyd Huger, MD 11/13/2015 5:47 AM

## 2015-11-13 ENCOUNTER — Inpatient Hospital Stay: Payer: Commercial Managed Care - HMO | Attending: Internal Medicine

## 2015-11-13 ENCOUNTER — Other Ambulatory Visit: Payer: Self-pay | Admitting: Oncology

## 2015-11-13 VITALS — BP 133/81 | HR 82 | Temp 97.5°F | Resp 18

## 2015-11-13 DIAGNOSIS — Z86711 Personal history of pulmonary embolism: Secondary | ICD-10-CM | POA: Insufficient documentation

## 2015-11-13 DIAGNOSIS — M542 Cervicalgia: Secondary | ICD-10-CM | POA: Insufficient documentation

## 2015-11-13 DIAGNOSIS — R531 Weakness: Secondary | ICD-10-CM | POA: Diagnosis not present

## 2015-11-13 DIAGNOSIS — R5381 Other malaise: Secondary | ICD-10-CM | POA: Insufficient documentation

## 2015-11-13 DIAGNOSIS — M549 Dorsalgia, unspecified: Secondary | ICD-10-CM | POA: Diagnosis not present

## 2015-11-13 DIAGNOSIS — G2581 Restless legs syndrome: Secondary | ICD-10-CM | POA: Diagnosis not present

## 2015-11-13 DIAGNOSIS — M25512 Pain in left shoulder: Secondary | ICD-10-CM | POA: Insufficient documentation

## 2015-11-13 DIAGNOSIS — E7800 Pure hypercholesterolemia, unspecified: Secondary | ICD-10-CM | POA: Diagnosis not present

## 2015-11-13 DIAGNOSIS — K219 Gastro-esophageal reflux disease without esophagitis: Secondary | ICD-10-CM | POA: Diagnosis not present

## 2015-11-13 DIAGNOSIS — Z79899 Other long term (current) drug therapy: Secondary | ICD-10-CM | POA: Diagnosis not present

## 2015-11-13 DIAGNOSIS — D696 Thrombocytopenia, unspecified: Secondary | ICD-10-CM

## 2015-11-13 DIAGNOSIS — M810 Age-related osteoporosis without current pathological fracture: Secondary | ICD-10-CM | POA: Insufficient documentation

## 2015-11-13 DIAGNOSIS — G8929 Other chronic pain: Secondary | ICD-10-CM | POA: Insufficient documentation

## 2015-11-13 DIAGNOSIS — D693 Immune thrombocytopenic purpura: Secondary | ICD-10-CM | POA: Diagnosis not present

## 2015-11-13 DIAGNOSIS — Z9081 Acquired absence of spleen: Secondary | ICD-10-CM | POA: Diagnosis not present

## 2015-11-13 DIAGNOSIS — M1990 Unspecified osteoarthritis, unspecified site: Secondary | ICD-10-CM | POA: Insufficient documentation

## 2015-11-13 DIAGNOSIS — R5383 Other fatigue: Secondary | ICD-10-CM | POA: Diagnosis not present

## 2015-11-13 MED ORDER — DIPHENHYDRAMINE HCL 25 MG PO CAPS
ORAL_CAPSULE | ORAL | Status: AC
  Filled 2015-11-13: qty 1

## 2015-11-13 MED ORDER — DIPHENHYDRAMINE HCL 25 MG PO TABS
25.0000 mg | ORAL_TABLET | ORAL | Status: DC | PRN
  Administered 2015-11-13: 25 mg via ORAL
  Filled 2015-11-13: qty 1

## 2015-11-13 MED ORDER — ACETAMINOPHEN 325 MG PO TABS
650.0000 mg | ORAL_TABLET | Freq: Four times a day (QID) | ORAL | Status: DC | PRN
  Administered 2015-11-13: 650 mg via ORAL
  Filled 2015-11-13: qty 2

## 2015-11-13 MED ORDER — SODIUM CHLORIDE 0.9 % IV SOLN
Freq: Once | INTRAVENOUS | Status: AC
  Administered 2015-11-13: 10:00:00 via INTRAVENOUS
  Filled 2015-11-13: qty 1000

## 2015-11-13 MED ORDER — IMMUNE GLOBULIN (HUMAN) 10 GM/100ML IV SOLN
30.0000 g | Freq: Once | INTRAVENOUS | Status: AC
  Administered 2015-11-13: 30 g via INTRAVENOUS
  Filled 2015-11-13: qty 300

## 2015-11-19 ENCOUNTER — Inpatient Hospital Stay: Payer: Commercial Managed Care - HMO

## 2015-11-19 DIAGNOSIS — R5383 Other fatigue: Secondary | ICD-10-CM | POA: Diagnosis not present

## 2015-11-19 DIAGNOSIS — M542 Cervicalgia: Secondary | ICD-10-CM | POA: Diagnosis not present

## 2015-11-19 DIAGNOSIS — R5381 Other malaise: Secondary | ICD-10-CM | POA: Diagnosis not present

## 2015-11-19 DIAGNOSIS — F3341 Major depressive disorder, recurrent, in partial remission: Secondary | ICD-10-CM | POA: Diagnosis not present

## 2015-11-19 DIAGNOSIS — G894 Chronic pain syndrome: Secondary | ICD-10-CM | POA: Diagnosis not present

## 2015-11-19 DIAGNOSIS — R531 Weakness: Secondary | ICD-10-CM | POA: Diagnosis not present

## 2015-11-19 DIAGNOSIS — F5104 Psychophysiologic insomnia: Secondary | ICD-10-CM | POA: Diagnosis not present

## 2015-11-19 DIAGNOSIS — D473 Essential (hemorrhagic) thrombocythemia: Secondary | ICD-10-CM | POA: Diagnosis not present

## 2015-11-19 DIAGNOSIS — F419 Anxiety disorder, unspecified: Secondary | ICD-10-CM | POA: Diagnosis not present

## 2015-11-19 DIAGNOSIS — Z1239 Encounter for other screening for malignant neoplasm of breast: Secondary | ICD-10-CM | POA: Diagnosis not present

## 2015-11-19 DIAGNOSIS — K219 Gastro-esophageal reflux disease without esophagitis: Secondary | ICD-10-CM | POA: Diagnosis not present

## 2015-11-19 DIAGNOSIS — D693 Immune thrombocytopenic purpura: Secondary | ICD-10-CM

## 2015-11-19 DIAGNOSIS — E039 Hypothyroidism, unspecified: Secondary | ICD-10-CM | POA: Diagnosis not present

## 2015-11-19 DIAGNOSIS — I10 Essential (primary) hypertension: Secondary | ICD-10-CM | POA: Diagnosis not present

## 2015-11-19 DIAGNOSIS — Z79899 Other long term (current) drug therapy: Secondary | ICD-10-CM | POA: Diagnosis not present

## 2015-11-19 DIAGNOSIS — Z9081 Acquired absence of spleen: Secondary | ICD-10-CM | POA: Diagnosis not present

## 2015-11-19 DIAGNOSIS — G8929 Other chronic pain: Secondary | ICD-10-CM | POA: Diagnosis not present

## 2015-11-19 DIAGNOSIS — M25512 Pain in left shoulder: Secondary | ICD-10-CM | POA: Diagnosis not present

## 2015-11-19 LAB — CBC WITH DIFFERENTIAL/PLATELET
BASOS PCT: 1 %
Basophils Absolute: 0 10*3/uL (ref 0–0.1)
EOS ABS: 0 10*3/uL (ref 0–0.7)
Eosinophils Relative: 0 %
HCT: 40.2 % (ref 35.0–47.0)
HEMOGLOBIN: 13.7 g/dL (ref 12.0–16.0)
Lymphocytes Relative: 37 %
Lymphs Abs: 2 10*3/uL (ref 1.0–3.6)
MCH: 31.7 pg (ref 26.0–34.0)
MCHC: 34 g/dL (ref 32.0–36.0)
MCV: 93.4 fL (ref 80.0–100.0)
MONOS PCT: 7 %
Monocytes Absolute: 0.4 10*3/uL (ref 0.2–0.9)
NEUTROS PCT: 55 %
Neutro Abs: 2.9 10*3/uL (ref 1.4–6.5)
Platelets: 179 10*3/uL (ref 150–440)
RBC: 4.31 MIL/uL (ref 3.80–5.20)
RDW: 15.4 % — ABNORMAL HIGH (ref 11.5–14.5)
WBC: 5.3 10*3/uL (ref 3.6–11.0)

## 2015-11-22 ENCOUNTER — Other Ambulatory Visit: Payer: Self-pay | Admitting: *Deleted

## 2015-11-22 DIAGNOSIS — D693 Immune thrombocytopenic purpura: Secondary | ICD-10-CM

## 2015-11-26 ENCOUNTER — Ambulatory Visit: Payer: Commercial Managed Care - HMO

## 2015-11-26 ENCOUNTER — Inpatient Hospital Stay: Payer: Commercial Managed Care - HMO

## 2015-11-26 ENCOUNTER — Inpatient Hospital Stay (HOSPITAL_BASED_OUTPATIENT_CLINIC_OR_DEPARTMENT_OTHER): Payer: Commercial Managed Care - HMO | Admitting: Oncology

## 2015-11-26 ENCOUNTER — Encounter: Payer: Self-pay | Admitting: Oncology

## 2015-11-26 VITALS — BP 137/75 | HR 116 | Temp 98.5°F | Wt 142.0 lb

## 2015-11-26 DIAGNOSIS — M542 Cervicalgia: Secondary | ICD-10-CM | POA: Diagnosis not present

## 2015-11-26 DIAGNOSIS — R5383 Other fatigue: Secondary | ICD-10-CM

## 2015-11-26 DIAGNOSIS — M810 Age-related osteoporosis without current pathological fracture: Secondary | ICD-10-CM

## 2015-11-26 DIAGNOSIS — D693 Immune thrombocytopenic purpura: Secondary | ICD-10-CM

## 2015-11-26 DIAGNOSIS — Z79899 Other long term (current) drug therapy: Secondary | ICD-10-CM

## 2015-11-26 DIAGNOSIS — E7800 Pure hypercholesterolemia, unspecified: Secondary | ICD-10-CM

## 2015-11-26 DIAGNOSIS — D696 Thrombocytopenia, unspecified: Secondary | ICD-10-CM

## 2015-11-26 DIAGNOSIS — R531 Weakness: Secondary | ICD-10-CM | POA: Diagnosis not present

## 2015-11-26 DIAGNOSIS — G8929 Other chronic pain: Secondary | ICD-10-CM | POA: Diagnosis not present

## 2015-11-26 DIAGNOSIS — R5381 Other malaise: Secondary | ICD-10-CM

## 2015-11-26 DIAGNOSIS — Z86711 Personal history of pulmonary embolism: Secondary | ICD-10-CM

## 2015-11-26 DIAGNOSIS — M549 Dorsalgia, unspecified: Secondary | ICD-10-CM

## 2015-11-26 DIAGNOSIS — K219 Gastro-esophageal reflux disease without esophagitis: Secondary | ICD-10-CM

## 2015-11-26 DIAGNOSIS — M25512 Pain in left shoulder: Secondary | ICD-10-CM | POA: Diagnosis not present

## 2015-11-26 DIAGNOSIS — Z9081 Acquired absence of spleen: Secondary | ICD-10-CM | POA: Diagnosis not present

## 2015-11-26 LAB — CBC WITH DIFFERENTIAL/PLATELET
BASOS ABS: 0.1 10*3/uL (ref 0–0.1)
Basophils Relative: 1 %
EOS PCT: 0 %
Eosinophils Absolute: 0 10*3/uL (ref 0–0.7)
HEMATOCRIT: 39.8 % (ref 35.0–47.0)
HEMOGLOBIN: 13.5 g/dL (ref 12.0–16.0)
LYMPHS ABS: 2.4 10*3/uL (ref 1.0–3.6)
LYMPHS PCT: 40 %
MCH: 31.8 pg (ref 26.0–34.0)
MCHC: 34 g/dL (ref 32.0–36.0)
MCV: 93.7 fL (ref 80.0–100.0)
Monocytes Absolute: 0.4 10*3/uL (ref 0.2–0.9)
Monocytes Relative: 7 %
NEUTROS ABS: 3.2 10*3/uL (ref 1.4–6.5)
NEUTROS PCT: 52 %
Platelets: 56 10*3/uL — ABNORMAL LOW (ref 150–440)
RBC: 4.24 MIL/uL (ref 3.80–5.20)
RDW: 15.4 % — ABNORMAL HIGH (ref 11.5–14.5)
WBC: 6.1 10*3/uL (ref 3.6–11.0)

## 2015-11-26 MED ORDER — DIPHENHYDRAMINE HCL 25 MG PO TABS
25.0000 mg | ORAL_TABLET | ORAL | Status: DC | PRN
  Administered 2015-11-26: 25 mg via ORAL
  Filled 2015-11-26: qty 1

## 2015-11-26 MED ORDER — SODIUM CHLORIDE 0.9 % IV SOLN
Freq: Once | INTRAVENOUS | Status: AC
  Administered 2015-11-26: 11:00:00 via INTRAVENOUS
  Filled 2015-11-26: qty 1000

## 2015-11-26 MED ORDER — IMMUNE GLOBULIN (HUMAN) 10 GM/100ML IV SOLN
0.5000 g/kg | Freq: Once | INTRAVENOUS | Status: AC
  Administered 2015-11-26: 30 g via INTRAVENOUS
  Filled 2015-11-26: qty 300

## 2015-11-26 MED ORDER — ACETAMINOPHEN 325 MG PO TABS
650.0000 mg | ORAL_TABLET | Freq: Four times a day (QID) | ORAL | Status: DC | PRN
  Administered 2015-11-26: 650 mg via ORAL
  Filled 2015-11-26: qty 2

## 2015-11-26 NOTE — Progress Notes (Signed)
[facility]  [phone]  [phone]     Ellizabeth Nuttle DOB: 06/06/1933  MR#: [id number]  [id number]  Patient Care Team: Idelle Crouch, MD as PCP - General (Internal Medicine)  CHIEF COMPLAINT:  Chief Complaint  Patient presents with  . ITP    INTERVAL HISTORY: Patient returns to clinic today for further evaluation and repeat laboratory work. She required IVIG only 2 weeks after her previous infusion. Her platelets responded once again, but patient still feels weak and fatigued. She otherwise feels well. She does not complain of easy bleeding or bruising today. She denies any fevers. She has no chest pain or shortness of breath. She denies any nausea, vomiting, constipation, or diarrhea. She does not have any melena or hematochezia. Patient offers no further specific complaints today.   REVIEW OF SYSTEMS:   Review of Systems  Constitutional: Positive for malaise/fatigue.  HENT: Negative.   Eyes: Negative.   Respiratory: Negative.   Gastrointestinal: Negative.   Neurological: Positive for weakness.  Endo/Heme/Allergies: Negative.     As per HPI. Otherwise, a complete review of systems is negatve.  ONCOLOGY HISTORY:  No history exists.    PAST MEDICAL HISTORY: Past Medical History  Diagnosis Date  . Hypercholesteremia   . ITP (idiopathic thrombocytopenic purpura)   . Restless legs   . Anemia   . Depression   . Osteoarthritis   . GERD (gastroesophageal reflux disease)   . Hiatal hernia   . Chronic back pain   . Asthma   . Osteoporosis   . Depression   . Anxiety   . Pulmonary emboli ([place])     PAST SURGICAL HISTORY: Past Surgical History  Procedure Laterality Date  . Splenectomy, partial    . Total abdominal hysterectomy    . Lumbar disc surgery    . Carpal tunnel release    . Esophagogastroduodenoscopy (egd) with propofol N/A 09/10/2015    Procedure: ESOPHAGOGASTRODUODENOSCOPY (EGD) WITH PROPOFOL;   Surgeon: Lollie Sails, MD;  Location: [facility] ENDOSCOPY;  Service: Endoscopy;  Laterality: N/A;    FAMILY HISTORY Family History  Problem Relation Age of Onset  . Stroke Mother       HEALTH MAINTENANCE: Social History  Substance Use Topics  . Smoking status: Never Smoker   . Smokeless tobacco: Never Used  . Alcohol Use: No    Allergies  Allergen Reactions  . Aspirin     Other reaction(s): Distress (finding)  . Diazepam     Other reaction(s): Itching of Skin sneezing  . Morphine     Other reaction(s): Itching of Skin  . Other     Other reaction(s): Unknown seasonal allergies  . Tetanus Toxoids     Other reaction(s): Localized superficial swelling of skin    Current Outpatient Prescriptions  Medication Sig Dispense Refill  . acetaminophen (TYLENOL) 325 MG tablet Take 2 tablets (650 mg total) by mouth every 6 (six) hours as needed for mild pain (or Fever >/= 101). 100 tablet 0  . albuterol (PROVENTIL HFA;VENTOLIN HFA) 108 (90 BASE) MCG/ACT inhaler Inhale 2 puffs into the lungs every 6 (six) hours as needed for wheezing or shortness of breath.    . ALPRAZolam (XANAX) 0.25 MG tablet TAKE 1 TABLET TWICE DAILY AS NEEDED FOR SLEEP    . BUDESONIDE-FORMOTEROL FUMARATE IN Inhale 2 puffs into the lungs 2 (two) times daily as needed (for shortness of breath and wheezing.).     Marland Kitchen buPROPion (WELLBUTRIN XL) 150 MG 24 hr tablet  Take by mouth.    . busPIRone (BUSPAR) 15 MG tablet Take by mouth.    . Calcium Carbonate-Vitamin D (CALCIUM 600+D) 600-400 MG-UNIT per tablet Take 1 tablet by mouth daily.    . citalopram (CELEXA) 40 MG tablet Take 40 mg by mouth daily.    . Cyanocobalamin (RA VITAMIN B-12 TR) 1000 MCG TBCR Take 1,000 mcg by mouth daily.     Marland Kitchen docusate sodium (COLACE) 100 MG capsule Take 1 capsule (100 mg total) by mouth 2 (two) times daily as needed for mild constipation. 10 capsule 0  . donepezil (ARICEPT) 5 MG tablet Take 5 mg by mouth daily.    Marland Kitchen eltrombopag (PROMACTA) 50  MG tablet Take 50 mg by mouth daily. Take on an empty stomach 1 hour before a meal or 2 hours after    . gabapentin (NEURONTIN) 300 MG capsule 300mg  orally 4 times a day    . HYDROcodone-acetaminophen (NORCO/VICODIN) 5-325 MG per tablet Reported on 10/17/2015    . levothyroxine (SYNTHROID, LEVOTHROID) 50 MCG tablet Take 50 mcg by mouth daily before breakfast. Take 30 to 60 minutes before breakfast.    . memantine (NAMENDA) 5 MG tablet Take by mouth.    . Multiple Vitamins-Minerals (OCUVITE-LUTEIN PO) Take 1 capsule by mouth daily.     . ondansetron (ZOFRAN) 4 MG tablet Take 1 tablet (4 mg total) by mouth every 6 (six) hours as needed for nausea. 20 tablet 0  . oxybutynin (DITROPAN) 5 MG tablet Take by mouth.    . pantoprazole (PROTONIX) 20 MG tablet Take by mouth.    . pravastatin (PRAVACHOL) 20 MG tablet Take 20 mg by mouth daily.    . ranitidine (ZANTAC) 300 MG tablet Take by mouth.    . senna (SENOKOT) 8.6 MG TABS tablet Take 1 tablet (8.6 mg total) by mouth daily as needed for mild constipation. 120 each 0  . traMADol (ULTRAM) 50 MG tablet Take 50 mg by mouth 3 (three) times daily as needed for moderate pain or severe pain. pain    . venlafaxine XR (EFFEXOR-XR) 37.5 MG 24 hr capsule Take 1 capsule by mouth daily.     . vitamin E (E-400) 400 UNIT capsule Take 400 Units by mouth daily.    Marland Kitchen zolpidem (AMBIEN) 5 MG tablet Take 5 mg by mouth at bedtime as needed for sleep.     No current facility-administered medications for this visit.   Facility-Administered Medications Ordered in Other Visits  Medication Dose Route Frequency Provider Last Rate Last Dose  . acetaminophen (TYLENOL) tablet 650 mg  650 mg Oral Q6H PRN Lloyd Huger, MD   650 mg at 11/26/15 1055  . diphenhydrAMINE (BENADRYL) tablet 25 mg  25 mg Oral Q4H PRN Lloyd Huger, MD   25 mg at 11/26/15 1055    OBJECTIVE: BP 137/75 mmHg  Pulse 116  Temp(Src) 98.5 F (36.9 C) (Tympanic)  Wt 141 lb 15.6 oz (64.4 kg)   Body  mass index is 23.63 kg/(m^2).    ECOG FS:0 - Asymptomatic  General: Well-developed, well-nourished, no acute distress. Eyes: Pink conjunctiva, anicteric sclera. Lungs: Clear to auscultation bilaterally. Heart: Regular rate and rhythm. No rubs, murmurs, or gallops. Abdomen: Soft, nontender, nondistended. No organomegaly noted, normoactive bowel sounds. Musculoskeletal: No edema, cyanosis, or clubbing. Neuro: Alert, answering all questions appropriately. Cranial nerves grossly intact. Skin: No rashes or petechiae noted. Psych: Normal affect.   LAB RESULTS:  Appointment on 11/26/2015  Component Date Value Ref Range Status  .  WBC 11/26/2015 6.1  3.6 - 11.0 K/uL Final  . RBC 11/26/2015 4.24  3.80 - 5.20 MIL/uL Final  . Hemoglobin 11/26/2015 13.5  12.0 - 16.0 g/dL Final  . HCT 11/26/2015 39.8  35.0 - 47.0 % Final  . MCV 11/26/2015 93.7  80.0 - 100.0 fL Final  . MCH 11/26/2015 31.8  26.0 - 34.0 pg Final  . MCHC 11/26/2015 34.0  32.0 - 36.0 g/dL Final  . RDW 11/26/2015 15.4* 11.5 - 14.5 % Final  . Platelets 11/26/2015 56* 150 - 440 K/uL Final  . Neutrophils Relative % 11/26/2015 52   Final  . Neutro Abs 11/26/2015 3.2  1.4 - 6.5 K/uL Final  . Lymphocytes Relative 11/26/2015 40   Final  . Lymphs Abs 11/26/2015 2.4  1.0 - 3.6 K/uL Final  . Monocytes Relative 11/26/2015 7   Final  . Monocytes Absolute 11/26/2015 0.4  0.2 - 0.9 K/uL Final  . Eosinophils Relative 11/26/2015 0   Final  . Eosinophils Absolute 11/26/2015 0.0  0 - 0.7 K/uL Final  . Basophils Relative 11/26/2015 1   Final  . Basophils Absolute 11/26/2015 0.1  0 - 0.1 K/uL Final    STUDIES: No results found.  ASSESSMENT & PLAN:    1. Chronic refractory ITP: Patient had a poor response to Prednisone, WinRho, and Rituxan. She is status post splenectomy in 2007. She was previously on Nplate and her response was labile at best. She responds to IVIG, although this is not very durable only lasting now 2 weeks. Proceed with  0.5mg /kg IVIG today and tomorrow. Will try to increase her IVIG dose to 0.7-0.8 mg/kg at next infusion. Return to clinic in 1 week for laboratory work and then in 2 weeks for further evaluation and consideration of IVIG. Given the decreased responsiveness to IVIG, Promacta may be used in the future, but this was denied by insurance.  Patient expressed understanding and was in agreement with this plan. She also understands that She can call clinic at any time with any questions, concerns, or complaints.    Mayra Reel, NP   11/26/2015 12:34 PM  Patient was seen and evaluated independently and I agree with the assessment and plan as dictated above.  Lloyd Huger, MD 12/01/2015 7:05 AM

## 2015-11-27 ENCOUNTER — Inpatient Hospital Stay: Payer: Commercial Managed Care - HMO

## 2015-11-27 ENCOUNTER — Other Ambulatory Visit: Payer: Self-pay | Admitting: Oncology

## 2015-11-27 VITALS — BP 126/75 | HR 68 | Temp 98.3°F

## 2015-11-27 DIAGNOSIS — Z9081 Acquired absence of spleen: Secondary | ICD-10-CM | POA: Diagnosis not present

## 2015-11-27 DIAGNOSIS — R531 Weakness: Secondary | ICD-10-CM | POA: Diagnosis not present

## 2015-11-27 DIAGNOSIS — M542 Cervicalgia: Secondary | ICD-10-CM | POA: Diagnosis not present

## 2015-11-27 DIAGNOSIS — D693 Immune thrombocytopenic purpura: Secondary | ICD-10-CM

## 2015-11-27 DIAGNOSIS — D696 Thrombocytopenia, unspecified: Secondary | ICD-10-CM

## 2015-11-27 DIAGNOSIS — M25512 Pain in left shoulder: Secondary | ICD-10-CM | POA: Diagnosis not present

## 2015-11-27 DIAGNOSIS — R5381 Other malaise: Secondary | ICD-10-CM | POA: Diagnosis not present

## 2015-11-27 DIAGNOSIS — R5383 Other fatigue: Secondary | ICD-10-CM | POA: Diagnosis not present

## 2015-11-27 DIAGNOSIS — G8929 Other chronic pain: Secondary | ICD-10-CM | POA: Diagnosis not present

## 2015-11-27 DIAGNOSIS — Z79899 Other long term (current) drug therapy: Secondary | ICD-10-CM | POA: Diagnosis not present

## 2015-11-27 MED ORDER — IMMUNE GLOBULIN (HUMAN) 10 GM/100ML IV SOLN
0.5000 g/kg | Freq: Once | INTRAVENOUS | Status: AC
  Administered 2015-11-27: 30 g via INTRAVENOUS
  Filled 2015-11-27: qty 300

## 2015-11-27 MED ORDER — DIPHENHYDRAMINE HCL 25 MG PO TABS
25.0000 mg | ORAL_TABLET | ORAL | Status: DC | PRN
  Administered 2015-11-27: 25 mg via ORAL
  Filled 2015-11-27: qty 1

## 2015-11-27 MED ORDER — ACETAMINOPHEN 325 MG PO TABS
650.0000 mg | ORAL_TABLET | Freq: Four times a day (QID) | ORAL | Status: DC | PRN
  Administered 2015-11-27: 650 mg via ORAL
  Filled 2015-11-27: qty 2

## 2015-11-27 MED ORDER — SODIUM CHLORIDE 0.9 % IV SOLN
Freq: Once | INTRAVENOUS | Status: AC
Start: 2015-11-27 — End: 2015-11-27
  Administered 2015-11-27: 10:00:00 via INTRAVENOUS
  Filled 2015-11-27: qty 1000

## 2015-12-03 ENCOUNTER — Inpatient Hospital Stay: Payer: Commercial Managed Care - HMO

## 2015-12-03 DIAGNOSIS — M25512 Pain in left shoulder: Secondary | ICD-10-CM | POA: Diagnosis not present

## 2015-12-03 DIAGNOSIS — R5383 Other fatigue: Secondary | ICD-10-CM | POA: Diagnosis not present

## 2015-12-03 DIAGNOSIS — Z79899 Other long term (current) drug therapy: Secondary | ICD-10-CM | POA: Diagnosis not present

## 2015-12-03 DIAGNOSIS — D693 Immune thrombocytopenic purpura: Secondary | ICD-10-CM | POA: Diagnosis not present

## 2015-12-03 DIAGNOSIS — Z9081 Acquired absence of spleen: Secondary | ICD-10-CM | POA: Diagnosis not present

## 2015-12-03 DIAGNOSIS — R531 Weakness: Secondary | ICD-10-CM | POA: Diagnosis not present

## 2015-12-03 DIAGNOSIS — G8929 Other chronic pain: Secondary | ICD-10-CM | POA: Diagnosis not present

## 2015-12-03 DIAGNOSIS — M542 Cervicalgia: Secondary | ICD-10-CM | POA: Diagnosis not present

## 2015-12-03 DIAGNOSIS — R5381 Other malaise: Secondary | ICD-10-CM | POA: Diagnosis not present

## 2015-12-03 LAB — CBC WITH DIFFERENTIAL/PLATELET
Basophils Absolute: 0.1 10*3/uL (ref 0–0.1)
Basophils Relative: 1 %
EOS ABS: 0 10*3/uL (ref 0–0.7)
EOS PCT: 1 %
HCT: 37 % (ref 35.0–47.0)
Hemoglobin: 12.6 g/dL (ref 12.0–16.0)
LYMPHS ABS: 2.6 10*3/uL (ref 1.0–3.6)
LYMPHS PCT: 41 %
MCH: 32.2 pg (ref 26.0–34.0)
MCHC: 34 g/dL (ref 32.0–36.0)
MCV: 94.7 fL (ref 80.0–100.0)
MONO ABS: 0.5 10*3/uL (ref 0.2–0.9)
Monocytes Relative: 8 %
Neutro Abs: 3.2 10*3/uL (ref 1.4–6.5)
Neutrophils Relative %: 49 %
PLATELETS: 110 10*3/uL — AB (ref 150–440)
RBC: 3.9 MIL/uL (ref 3.80–5.20)
RDW: 15.6 % — AB (ref 11.5–14.5)
WBC: 6.4 10*3/uL (ref 3.6–11.0)

## 2015-12-04 ENCOUNTER — Other Ambulatory Visit: Payer: Self-pay | Admitting: *Deleted

## 2015-12-04 MED ORDER — ELTROMBOPAG OLAMINE 50 MG PO TABS: 50.0000 mg | ORAL_TABLET | Freq: Every day | ORAL | Status: DC

## 2015-12-10 ENCOUNTER — Encounter: Payer: Self-pay | Admitting: Oncology

## 2015-12-10 ENCOUNTER — Inpatient Hospital Stay: Payer: Commercial Managed Care - HMO

## 2015-12-10 ENCOUNTER — Inpatient Hospital Stay (HOSPITAL_BASED_OUTPATIENT_CLINIC_OR_DEPARTMENT_OTHER): Payer: Commercial Managed Care - HMO | Admitting: Oncology

## 2015-12-10 VITALS — BP 151/71 | HR 57 | Temp 96.3°F | Wt 144.6 lb

## 2015-12-10 VITALS — BP 146/81 | HR 58 | Resp 20

## 2015-12-10 DIAGNOSIS — R5381 Other malaise: Secondary | ICD-10-CM

## 2015-12-10 DIAGNOSIS — M542 Cervicalgia: Secondary | ICD-10-CM | POA: Diagnosis not present

## 2015-12-10 DIAGNOSIS — M25512 Pain in left shoulder: Secondary | ICD-10-CM

## 2015-12-10 DIAGNOSIS — D693 Immune thrombocytopenic purpura: Secondary | ICD-10-CM

## 2015-12-10 DIAGNOSIS — R531 Weakness: Secondary | ICD-10-CM

## 2015-12-10 DIAGNOSIS — R5383 Other fatigue: Secondary | ICD-10-CM

## 2015-12-10 DIAGNOSIS — M549 Dorsalgia, unspecified: Secondary | ICD-10-CM

## 2015-12-10 DIAGNOSIS — E7800 Pure hypercholesterolemia, unspecified: Secondary | ICD-10-CM

## 2015-12-10 DIAGNOSIS — Z9081 Acquired absence of spleen: Secondary | ICD-10-CM | POA: Diagnosis not present

## 2015-12-10 DIAGNOSIS — Z79899 Other long term (current) drug therapy: Secondary | ICD-10-CM | POA: Diagnosis not present

## 2015-12-10 DIAGNOSIS — Z86711 Personal history of pulmonary embolism: Secondary | ICD-10-CM

## 2015-12-10 DIAGNOSIS — G8929 Other chronic pain: Secondary | ICD-10-CM

## 2015-12-10 LAB — CBC WITH DIFFERENTIAL/PLATELET
BASOS ABS: 0.1 10*3/uL (ref 0–0.1)
BASOS PCT: 1 %
EOS PCT: 1 %
Eosinophils Absolute: 0.1 10*3/uL (ref 0–0.7)
HCT: 41.1 % (ref 35.0–47.0)
Hemoglobin: 13.7 g/dL (ref 12.0–16.0)
LYMPHS PCT: 46 %
Lymphs Abs: 3.1 10*3/uL (ref 1.0–3.6)
MCH: 31.6 pg (ref 26.0–34.0)
MCHC: 33.4 g/dL (ref 32.0–36.0)
MCV: 94.9 fL (ref 80.0–100.0)
MONO ABS: 0.5 10*3/uL (ref 0.2–0.9)
MONOS PCT: 7 %
Neutro Abs: 3.2 10*3/uL (ref 1.4–6.5)
Neutrophils Relative %: 45 %
PLATELETS: 58 10*3/uL — AB (ref 150–440)
RBC: 4.33 MIL/uL (ref 3.80–5.20)
RDW: 15.4 % — AB (ref 11.5–14.5)
WBC: 6.9 10*3/uL (ref 3.6–11.0)

## 2015-12-10 MED ORDER — SODIUM CHLORIDE 0.9 % IV SOLN
Freq: Once | INTRAVENOUS | Status: AC
  Administered 2015-12-10: 10:00:00 via INTRAVENOUS
  Filled 2015-12-10: qty 1000

## 2015-12-10 MED ORDER — ACETAMINOPHEN 325 MG PO TABS
650.0000 mg | ORAL_TABLET | Freq: Four times a day (QID) | ORAL | Status: DC | PRN
  Administered 2015-12-10: 650 mg via ORAL
  Filled 2015-12-10: qty 2

## 2015-12-10 MED ORDER — IMMUNE GLOBULIN (HUMAN) 20 GM/200ML IV SOLN
0.6000 g/kg | Freq: Once | INTRAVENOUS | Status: AC
  Administered 2015-12-10: 40 g via INTRAVENOUS
  Filled 2015-12-10: qty 400

## 2015-12-10 MED ORDER — DIPHENHYDRAMINE HCL 25 MG PO TABS
25.0000 mg | ORAL_TABLET | Freq: Four times a day (QID) | ORAL | Status: DC | PRN
  Administered 2015-12-10: 25 mg via ORAL
  Filled 2015-12-10: qty 1

## 2015-12-10 NOTE — Progress Notes (Signed)
[place]  [phone]  [phone]) [phone]     Ariana [name] DOB: Feb 20, 1933  MR#: [id number]  [id number]  Patient Care Team: Idelle Crouch, MD as PCP - General (Internal Medicine)  CHIEF COMPLAINT:  Chief Complaint  Patient presents with  . ITP  . Fatigue    INTERVAL HISTORY: Patient returns to clinic today for further evaluation and repeat laboratory work. She required IVIG only 2 weeks after her previous infusion. Her platelets responded once again, but patient still feels weak and fatigued. She does complain of pain in her back, neck and left shoulder which she sees a physician at the pain clinic for.  She does not complain of easy bleeding or bruising today. She denies any fevers. She has no chest pain or shortness of breath. She denies any nausea, vomiting, constipation, or diarrhea. She does not have any melena or hematochezia. Patient offers no further specific complaints today.   REVIEW OF SYSTEMS:   Review of Systems  Constitutional: Positive for malaise/fatigue.  HENT: Negative.   Eyes: Negative.   Respiratory: Negative.   Gastrointestinal: Negative.   Neurological: Positive for weakness.  Endo/Heme/Allergies: Negative.     As per HPI. Otherwise, a complete review of systems is negatve.  ONCOLOGY HISTORY:  No history exists.    PAST MEDICAL HISTORY: Past Medical History  Diagnosis Date  . Hypercholesteremia   . ITP (idiopathic thrombocytopenic purpura)   . Restless legs   . Anemia   . Depression   . Osteoarthritis   . GERD (gastroesophageal reflux disease)   . Hiatal hernia   . Chronic back pain   . Asthma   . Osteoporosis   . Depression   . Anxiety   . Pulmonary emboli ([place])     PAST SURGICAL HISTORY: Past Surgical History  Procedure Laterality Date  . Splenectomy, partial    . Total abdominal hysterectomy    . Lumbar disc surgery    . Carpal tunnel release    . Esophagogastroduodenoscopy (egd)  with propofol N/A 09/10/2015    Procedure: ESOPHAGOGASTRODUODENOSCOPY (EGD) WITH PROPOFOL;  Surgeon: Lollie Sails, MD;  Location: [facility] ENDOSCOPY;  Service: Endoscopy;  Laterality: N/A;    FAMILY HISTORY Family History  Problem Relation Age of Onset  . Stroke Mother       HEALTH MAINTENANCE: Social History  Substance Use Topics  . Smoking status: Never Smoker   . Smokeless tobacco: Never Used  . Alcohol Use: No    Allergies  Allergen Reactions  . Aspirin     Other reaction(s): Distress (finding)  . Diazepam     Other reaction(s): Itching of Skin sneezing  . Morphine     Other reaction(s): Itching of Skin  . Other     Other reaction(s): Unknown seasonal allergies  . Tetanus Toxoids     Other reaction(s): Localized superficial swelling of skin    Current Outpatient Prescriptions  Medication Sig Dispense Refill  . acetaminophen (TYLENOL) 325 MG tablet Take 2 tablets (650 mg total) by mouth every 6 (six) hours as needed for mild pain (or Fever >/= 101). 100 tablet 0  . albuterol (PROVENTIL HFA;VENTOLIN HFA) 108 (90 BASE) MCG/ACT inhaler Inhale 2 puffs into the lungs every 6 (six) hours as needed for wheezing or shortness of breath.    . ALPRAZolam (XANAX) 0.25 MG tablet TAKE 1 TABLET TWICE DAILY AS NEEDED FOR SLEEP    . BUDESONIDE-FORMOTEROL FUMARATE IN Inhale 2 puffs into the lungs 2 (two) times  daily as needed (for shortness of breath and wheezing.).     Marland Kitchen buPROPion (WELLBUTRIN XL) 150 MG 24 hr tablet Take by mouth.    . busPIRone (BUSPAR) 15 MG tablet Take by mouth.    . Calcium Carbonate-Vitamin D (CALCIUM 600+D) 600-400 MG-UNIT per tablet Take 1 tablet by mouth daily.    . citalopram (CELEXA) 40 MG tablet Take 40 mg by mouth daily.    . Cyanocobalamin (RA VITAMIN B-12 TR) 1000 MCG TBCR Take 1,000 mcg by mouth daily.     Marland Kitchen docusate sodium (COLACE) 100 MG capsule Take 1 capsule (100 mg total) by mouth 2 (two) times daily as needed for mild constipation. 10 capsule 0    . donepezil (ARICEPT) 5 MG tablet Take 5 mg by mouth daily.    Marland Kitchen eltrombopag (PROMACTA) 50 MG tablet Take 1 tablet (50 mg total) by mouth daily. Take on an empty stomach 1 hour before a meal or 2 hours after 30 tablet 4  . gabapentin (NEURONTIN) 300 MG capsule 300mg  orally 4 times a day    . HYDROcodone-acetaminophen (NORCO/VICODIN) 5-325 MG per tablet Reported on 10/17/2015    . levothyroxine (SYNTHROID, LEVOTHROID) 50 MCG tablet Take 50 mcg by mouth daily before breakfast. Take 30 to 60 minutes before breakfast.    . memantine (NAMENDA) 5 MG tablet Take by mouth.    . Multiple Vitamins-Minerals (OCUVITE-LUTEIN PO) Take 1 capsule by mouth daily.     . ondansetron (ZOFRAN) 4 MG tablet Take 1 tablet (4 mg total) by mouth every 6 (six) hours as needed for nausea. 20 tablet 0  . oxybutynin (DITROPAN) 5 MG tablet Take by mouth.    . pantoprazole (PROTONIX) 20 MG tablet Take by mouth.    . pravastatin (PRAVACHOL) 20 MG tablet Take 20 mg by mouth daily.    . ranitidine (ZANTAC) 300 MG tablet Take by mouth.    . senna (SENOKOT) 8.6 MG TABS tablet Take 1 tablet (8.6 mg total) by mouth daily as needed for mild constipation. 120 each 0  . traMADol (ULTRAM) 50 MG tablet Take 50 mg by mouth 3 (three) times daily as needed for moderate pain or severe pain. pain    . venlafaxine XR (EFFEXOR-XR) 37.5 MG 24 hr capsule Take 1 capsule by mouth daily.     . vitamin E (E-400) 400 UNIT capsule Take 400 Units by mouth daily.    Marland Kitchen zolpidem (AMBIEN) 5 MG tablet Take 5 mg by mouth at bedtime as needed for sleep.     No current facility-administered medications for this visit.   Facility-Administered Medications Ordered in Other Visits  Medication Dose Route Frequency Provider Last Rate Last Dose  . acetaminophen (TYLENOL) tablet 650 mg  650 mg Oral Q6H PRN Lloyd Huger, MD   650 mg at 12/10/15 1031  . diphenhydrAMINE (BENADRYL) tablet 25 mg  25 mg Oral Q6H PRN Lloyd Huger, MD   25 mg at 12/10/15 1031     OBJECTIVE: BP 151/71 mmHg  Pulse 57  Temp(Src) 96.3 F (35.7 C) (Tympanic)  Wt 144 lb 10 oz (65.6 kg)   Body mass index is 24.07 kg/(m^2).    ECOG FS:0 - Asymptomatic  General: Well-developed, well-nourished, no acute distress. Eyes: Pink conjunctiva, anicteric sclera. Lungs: Clear to auscultation bilaterally. Heart: Regular rate and rhythm. No rubs, murmurs, or gallops. Abdomen: Soft, nontender, nondistended. No organomegaly noted, normoactive bowel sounds. Musculoskeletal: No edema, cyanosis, or clubbing. Neuro: Alert, answering all questions appropriately. Cranial  nerves grossly intact. Skin: No rashes or petechiae noted. Psych: Normal affect.   LAB RESULTS:  Appointment on 12/10/2015  Component Date Value Ref Range Status  . WBC 12/10/2015 6.9  3.6 - 11.0 K/uL Final  . RBC 12/10/2015 4.33  3.80 - 5.20 MIL/uL Final  . Hemoglobin 12/10/2015 13.7  12.0 - 16.0 g/dL Final  . HCT 12/10/2015 41.1  35.0 - 47.0 % Final  . MCV 12/10/2015 94.9  80.0 - 100.0 fL Final  . MCH 12/10/2015 31.6  26.0 - 34.0 pg Final  . MCHC 12/10/2015 33.4  32.0 - 36.0 g/dL Final  . RDW 12/10/2015 15.4* 11.5 - 14.5 % Final  . Platelets 12/10/2015 58* 150 - 440 K/uL Final  . Neutrophils Relative % 12/10/2015 45   Final  . Neutro Abs 12/10/2015 3.2  1.4 - 6.5 K/uL Final  . Lymphocytes Relative 12/10/2015 46   Final  . Lymphs Abs 12/10/2015 3.1  1.0 - 3.6 K/uL Final  . Monocytes Relative 12/10/2015 7   Final  . Monocytes Absolute 12/10/2015 0.5  0.2 - 0.9 K/uL Final  . Eosinophils Relative 12/10/2015 1   Final  . Eosinophils Absolute 12/10/2015 0.1  0 - 0.7 K/uL Final  . Basophils Relative 12/10/2015 1   Final  . Basophils Absolute 12/10/2015 0.1  0 - 0.1 K/uL Final    STUDIES: No results found.  ASSESSMENT & PLAN:    1. Chronic refractory ITP: Patient had a poor response to Prednisone, WinRho, and Rituxan. She is status post splenectomy in 2007. She was previously on Nplate and her response  was labile at best. She responds to IVIG, although this is not very durable only lasting now 2 weeks. Will increase the dose today to see if we can get more durable results. Proceed with 0.6mg /kg IVIG today and tomorrow. Will try to increase her IVIG dose to 0.7-0.8 mg/kg at next infusion. Return to clinic in 1 week for laboratory work and then in 2 weeks for further evaluation and consideration of IVIG. Given the decreased responsiveness to IVIG, Promacta may be used in the future if insurance will approve.   Patient expressed understanding and was in agreement with this plan. She also understands that She can call clinic at any time with any questions, concerns, or complaints.    Mayra Reel, NP   12/10/2015 12:53 PM  Patient was seen and evaluated independently and I agree with the assessment and plan as dictated above.  Lloyd Huger, MD 12/12/2015 11:20 PM

## 2015-12-11 ENCOUNTER — Inpatient Hospital Stay: Payer: Commercial Managed Care - HMO | Attending: Internal Medicine

## 2015-12-11 VITALS — BP 130/81 | HR 55 | Temp 96.5°F | Resp 20

## 2015-12-11 DIAGNOSIS — Z79899 Other long term (current) drug therapy: Secondary | ICD-10-CM | POA: Insufficient documentation

## 2015-12-11 DIAGNOSIS — M542 Cervicalgia: Secondary | ICD-10-CM | POA: Insufficient documentation

## 2015-12-11 DIAGNOSIS — M1990 Unspecified osteoarthritis, unspecified site: Secondary | ICD-10-CM | POA: Diagnosis not present

## 2015-12-11 DIAGNOSIS — R5383 Other fatigue: Secondary | ICD-10-CM | POA: Diagnosis not present

## 2015-12-11 DIAGNOSIS — Z9081 Acquired absence of spleen: Secondary | ICD-10-CM | POA: Diagnosis not present

## 2015-12-11 DIAGNOSIS — R5381 Other malaise: Secondary | ICD-10-CM | POA: Insufficient documentation

## 2015-12-11 DIAGNOSIS — F418 Other specified anxiety disorders: Secondary | ICD-10-CM | POA: Insufficient documentation

## 2015-12-11 DIAGNOSIS — Z7952 Long term (current) use of systemic steroids: Secondary | ICD-10-CM | POA: Diagnosis not present

## 2015-12-11 DIAGNOSIS — Z86711 Personal history of pulmonary embolism: Secondary | ICD-10-CM | POA: Insufficient documentation

## 2015-12-11 DIAGNOSIS — M549 Dorsalgia, unspecified: Secondary | ICD-10-CM | POA: Insufficient documentation

## 2015-12-11 DIAGNOSIS — G2581 Restless legs syndrome: Secondary | ICD-10-CM | POA: Diagnosis not present

## 2015-12-11 DIAGNOSIS — K219 Gastro-esophageal reflux disease without esophagitis: Secondary | ICD-10-CM | POA: Insufficient documentation

## 2015-12-11 DIAGNOSIS — D693 Immune thrombocytopenic purpura: Secondary | ICD-10-CM | POA: Insufficient documentation

## 2015-12-11 DIAGNOSIS — G8929 Other chronic pain: Secondary | ICD-10-CM | POA: Insufficient documentation

## 2015-12-11 DIAGNOSIS — R531 Weakness: Secondary | ICD-10-CM | POA: Insufficient documentation

## 2015-12-11 DIAGNOSIS — J45909 Unspecified asthma, uncomplicated: Secondary | ICD-10-CM | POA: Diagnosis not present

## 2015-12-11 DIAGNOSIS — E7800 Pure hypercholesterolemia, unspecified: Secondary | ICD-10-CM | POA: Diagnosis not present

## 2015-12-11 MED ORDER — DIPHENHYDRAMINE HCL 25 MG PO TABS
25.0000 mg | ORAL_TABLET | Freq: Four times a day (QID) | ORAL | Status: DC | PRN
  Administered 2015-12-11: 25 mg via ORAL
  Filled 2015-12-11: qty 1

## 2015-12-11 MED ORDER — DIPHENHYDRAMINE HCL 25 MG PO CAPS
ORAL_CAPSULE | ORAL | Status: AC
  Filled 2015-12-11: qty 1

## 2015-12-11 MED ORDER — IMMUNE GLOBULIN (HUMAN) 20 GM/200ML IV SOLN
0.6000 g/kg | Freq: Once | INTRAVENOUS | Status: AC
  Administered 2015-12-11: 40 g via INTRAVENOUS
  Filled 2015-12-11: qty 400

## 2015-12-11 MED ORDER — ACETAMINOPHEN 325 MG PO TABS
650.0000 mg | ORAL_TABLET | Freq: Four times a day (QID) | ORAL | Status: DC | PRN
  Administered 2015-12-11: 650 mg via ORAL
  Filled 2015-12-11: qty 2

## 2015-12-11 MED ORDER — SODIUM CHLORIDE 0.9 % IV SOLN
Freq: Once | INTRAVENOUS | Status: AC
  Administered 2015-12-11: 10:00:00 via INTRAVENOUS
  Filled 2015-12-11: qty 1000

## 2015-12-12 DIAGNOSIS — M62830 Muscle spasm of back: Secondary | ICD-10-CM | POA: Diagnosis not present

## 2015-12-12 DIAGNOSIS — M5136 Other intervertebral disc degeneration, lumbar region: Secondary | ICD-10-CM | POA: Diagnosis not present

## 2015-12-12 DIAGNOSIS — M5412 Radiculopathy, cervical region: Secondary | ICD-10-CM | POA: Diagnosis not present

## 2015-12-12 DIAGNOSIS — M5416 Radiculopathy, lumbar region: Secondary | ICD-10-CM | POA: Diagnosis not present

## 2015-12-16 DIAGNOSIS — G301 Alzheimer's disease with late onset: Secondary | ICD-10-CM | POA: Diagnosis not present

## 2015-12-16 DIAGNOSIS — F0280 Dementia in other diseases classified elsewhere without behavioral disturbance: Secondary | ICD-10-CM | POA: Diagnosis not present

## 2015-12-17 ENCOUNTER — Inpatient Hospital Stay: Payer: Commercial Managed Care - HMO

## 2015-12-17 DIAGNOSIS — D693 Immune thrombocytopenic purpura: Secondary | ICD-10-CM | POA: Diagnosis not present

## 2015-12-17 DIAGNOSIS — M549 Dorsalgia, unspecified: Secondary | ICD-10-CM | POA: Diagnosis not present

## 2015-12-17 DIAGNOSIS — R5383 Other fatigue: Secondary | ICD-10-CM | POA: Diagnosis not present

## 2015-12-17 DIAGNOSIS — Z79899 Other long term (current) drug therapy: Secondary | ICD-10-CM | POA: Diagnosis not present

## 2015-12-17 DIAGNOSIS — M542 Cervicalgia: Secondary | ICD-10-CM | POA: Diagnosis not present

## 2015-12-17 DIAGNOSIS — R531 Weakness: Secondary | ICD-10-CM | POA: Diagnosis not present

## 2015-12-17 DIAGNOSIS — G8929 Other chronic pain: Secondary | ICD-10-CM | POA: Diagnosis not present

## 2015-12-17 DIAGNOSIS — R5381 Other malaise: Secondary | ICD-10-CM | POA: Diagnosis not present

## 2015-12-17 DIAGNOSIS — Z9081 Acquired absence of spleen: Secondary | ICD-10-CM | POA: Diagnosis not present

## 2015-12-17 LAB — CBC WITH DIFFERENTIAL/PLATELET
Basophils Absolute: 0 10*3/uL (ref 0–0.1)
Basophils Relative: 1 %
EOS PCT: 0 %
Eosinophils Absolute: 0 10*3/uL (ref 0–0.7)
HCT: 37.9 % (ref 35.0–47.0)
Hemoglobin: 13.1 g/dL (ref 12.0–16.0)
LYMPHS ABS: 2 10*3/uL (ref 1.0–3.6)
LYMPHS PCT: 30 %
MCH: 32.7 pg (ref 26.0–34.0)
MCHC: 34.6 g/dL (ref 32.0–36.0)
MCV: 94.4 fL (ref 80.0–100.0)
Monocytes Absolute: 0.4 10*3/uL (ref 0.2–0.9)
Monocytes Relative: 6 %
Neutro Abs: 4.2 10*3/uL (ref 1.4–6.5)
Neutrophils Relative %: 63 %
PLATELETS: 197 10*3/uL (ref 150–440)
RBC: 4.01 MIL/uL (ref 3.80–5.20)
RDW: 15.7 % — AB (ref 11.5–14.5)
WBC: 6.6 10*3/uL (ref 3.6–11.0)

## 2015-12-18 ENCOUNTER — Telehealth: Payer: Self-pay

## 2015-12-18 NOTE — Telephone Encounter (Signed)
Notification that insurance has denied Promacta.

## 2015-12-20 ENCOUNTER — Telehealth: Payer: Self-pay

## 2015-12-20 NOTE — Telephone Encounter (Signed)
Promacta

## 2015-12-20 NOTE — Telephone Encounter (Signed)
Promacta was denied by insurance.  An appeals form has been filled out and will have patient sign at her appt on 12/24/15 and will Mail (as form instructs)

## 2015-12-24 ENCOUNTER — Inpatient Hospital Stay: Payer: Commercial Managed Care - HMO

## 2015-12-24 ENCOUNTER — Inpatient Hospital Stay (HOSPITAL_BASED_OUTPATIENT_CLINIC_OR_DEPARTMENT_OTHER): Payer: Commercial Managed Care - HMO | Admitting: Oncology

## 2015-12-24 VITALS — BP 134/79 | HR 61 | Temp 98.1°F | Resp 18 | Wt 146.2 lb

## 2015-12-24 DIAGNOSIS — M1990 Unspecified osteoarthritis, unspecified site: Secondary | ICD-10-CM

## 2015-12-24 DIAGNOSIS — R5381 Other malaise: Secondary | ICD-10-CM

## 2015-12-24 DIAGNOSIS — M549 Dorsalgia, unspecified: Secondary | ICD-10-CM | POA: Diagnosis not present

## 2015-12-24 DIAGNOSIS — D693 Immune thrombocytopenic purpura: Secondary | ICD-10-CM

## 2015-12-24 DIAGNOSIS — R5383 Other fatigue: Secondary | ICD-10-CM

## 2015-12-24 DIAGNOSIS — M542 Cervicalgia: Secondary | ICD-10-CM | POA: Diagnosis not present

## 2015-12-24 DIAGNOSIS — E7800 Pure hypercholesterolemia, unspecified: Secondary | ICD-10-CM

## 2015-12-24 DIAGNOSIS — Z79899 Other long term (current) drug therapy: Secondary | ICD-10-CM | POA: Diagnosis not present

## 2015-12-24 DIAGNOSIS — Z9081 Acquired absence of spleen: Secondary | ICD-10-CM

## 2015-12-24 DIAGNOSIS — G8929 Other chronic pain: Secondary | ICD-10-CM | POA: Diagnosis not present

## 2015-12-24 DIAGNOSIS — Z7952 Long term (current) use of systemic steroids: Secondary | ICD-10-CM

## 2015-12-24 DIAGNOSIS — R531 Weakness: Secondary | ICD-10-CM

## 2015-12-24 LAB — CBC WITH DIFFERENTIAL/PLATELET
BASOS PCT: 1 %
Basophils Absolute: 0.1 10*3/uL (ref 0–0.1)
Eosinophils Absolute: 0 10*3/uL (ref 0–0.7)
Eosinophils Relative: 0 %
HEMATOCRIT: 38.9 % (ref 35.0–47.0)
HEMOGLOBIN: 13.3 g/dL (ref 12.0–16.0)
LYMPHS ABS: 2.5 10*3/uL (ref 1.0–3.6)
Lymphocytes Relative: 22 %
MCH: 32.8 pg (ref 26.0–34.0)
MCHC: 34.2 g/dL (ref 32.0–36.0)
MCV: 95.9 fL (ref 80.0–100.0)
MONOS PCT: 9 %
Monocytes Absolute: 1 10*3/uL — ABNORMAL HIGH (ref 0.2–0.9)
NEUTROS ABS: 8 10*3/uL — AB (ref 1.4–6.5)
NEUTROS PCT: 68 %
Platelets: 31 10*3/uL — ABNORMAL LOW (ref 150–440)
RBC: 4.05 MIL/uL (ref 3.80–5.20)
RDW: 15.7 % — ABNORMAL HIGH (ref 11.5–14.5)
WBC: 11.6 10*3/uL — AB (ref 3.6–11.0)

## 2015-12-24 MED ORDER — DIPHENHYDRAMINE HCL 25 MG PO TABS
25.0000 mg | ORAL_TABLET | ORAL | Status: DC | PRN
  Administered 2015-12-24: 25 mg via ORAL
  Filled 2015-12-24: qty 1

## 2015-12-24 MED ORDER — SODIUM CHLORIDE 0.9 % IJ SOLN
10.0000 mL | INTRAMUSCULAR | Status: DC | PRN
  Filled 2015-12-24: qty 10

## 2015-12-24 MED ORDER — HEPARIN SOD (PORK) LOCK FLUSH 100 UNIT/ML IV SOLN: 500.0000 [IU] | Freq: Once | INTRAVENOUS | Status: DC | PRN

## 2015-12-24 MED ORDER — SODIUM CHLORIDE 0.9 % IV SOLN
Freq: Once | INTRAVENOUS | Status: AC
  Administered 2015-12-24: 20 mL/h via INTRAVENOUS
  Filled 2015-12-24: qty 1000

## 2015-12-24 MED ORDER — IMMUNE GLOBULIN (HUMAN) 10 GM/100ML IV SOLN
0.7000 g/kg | Freq: Every day | INTRAVENOUS | Status: DC
  Administered 2015-12-24: 45 g via INTRAVENOUS
  Filled 2015-12-24: qty 390

## 2015-12-24 MED ORDER — ACETAMINOPHEN 325 MG PO TABS
650.0000 mg | ORAL_TABLET | Freq: Four times a day (QID) | ORAL | Status: DC | PRN
  Administered 2015-12-24: 650 mg via ORAL
  Filled 2015-12-24: qty 2

## 2015-12-24 NOTE — Progress Notes (Signed)
Patient has been on Prednisone for 10 days prescribed by pain clinic.  She still feels fatigued.

## 2015-12-24 NOTE — Telephone Encounter (Signed)
I called insurance company to file an expedited appeals via phone, will hear from them within 72 hours.

## 2015-12-25 ENCOUNTER — Other Ambulatory Visit: Payer: Self-pay | Admitting: Internal Medicine

## 2015-12-25 ENCOUNTER — Inpatient Hospital Stay: Payer: Commercial Managed Care - HMO

## 2015-12-25 VITALS — BP 136/83 | HR 60 | Temp 95.8°F | Resp 18

## 2015-12-25 DIAGNOSIS — D693 Immune thrombocytopenic purpura: Secondary | ICD-10-CM

## 2015-12-25 DIAGNOSIS — R531 Weakness: Secondary | ICD-10-CM | POA: Diagnosis not present

## 2015-12-25 DIAGNOSIS — Z9081 Acquired absence of spleen: Secondary | ICD-10-CM | POA: Diagnosis not present

## 2015-12-25 DIAGNOSIS — R5383 Other fatigue: Secondary | ICD-10-CM | POA: Diagnosis not present

## 2015-12-25 DIAGNOSIS — M549 Dorsalgia, unspecified: Secondary | ICD-10-CM | POA: Diagnosis not present

## 2015-12-25 DIAGNOSIS — G8929 Other chronic pain: Secondary | ICD-10-CM | POA: Diagnosis not present

## 2015-12-25 DIAGNOSIS — R5381 Other malaise: Secondary | ICD-10-CM | POA: Diagnosis not present

## 2015-12-25 DIAGNOSIS — Z79899 Other long term (current) drug therapy: Secondary | ICD-10-CM | POA: Diagnosis not present

## 2015-12-25 DIAGNOSIS — M542 Cervicalgia: Secondary | ICD-10-CM | POA: Diagnosis not present

## 2015-12-25 MED ORDER — SODIUM CHLORIDE 0.9 % IV SOLN
INTRAVENOUS | Status: DC
  Administered 2015-12-25: 10:00:00 via INTRAVENOUS
  Filled 2015-12-25: qty 1000

## 2015-12-25 MED ORDER — ACETAMINOPHEN 325 MG PO TABS
650.0000 mg | ORAL_TABLET | Freq: Four times a day (QID) | ORAL | Status: DC | PRN
Start: 2015-12-25 — End: 2015-12-25
  Administered 2015-12-25: 650 mg via ORAL
  Filled 2015-12-25: qty 2

## 2015-12-25 MED ORDER — DIPHENHYDRAMINE HCL 25 MG PO TABS
25.0000 mg | ORAL_TABLET | ORAL | Status: DC | PRN
Start: 2015-12-25 — End: 2015-12-25
  Administered 2015-12-25: 25 mg via ORAL
  Filled 2015-12-25: qty 1

## 2015-12-25 MED ORDER — DIPHENHYDRAMINE HCL 25 MG PO CAPS
ORAL_CAPSULE | ORAL | Status: AC
  Filled 2015-12-25: qty 1

## 2015-12-25 MED ORDER — IMMUNE GLOBULIN (HUMAN) 10 GM/100ML IV SOLN
0.7000 g/kg | Freq: Once | INTRAVENOUS | Status: AC
Start: 2015-12-25 — End: 2015-12-25
  Administered 2015-12-25: 45 g via INTRAVENOUS
  Filled 2015-12-25: qty 380

## 2015-12-30 DIAGNOSIS — G301 Alzheimer's disease with late onset: Secondary | ICD-10-CM

## 2015-12-30 DIAGNOSIS — F0280 Dementia in other diseases classified elsewhere without behavioral disturbance: Secondary | ICD-10-CM | POA: Insufficient documentation

## 2015-12-30 NOTE — Progress Notes (Signed)
[place]  Telephone:(336) [phone]  Fax:(336) [phone]     Ariana [name] DOB: 1932-11-06  MR#: [id number]  [id number]  Patient Care Team: Idelle Crouch, MD as PCP - General (Internal Medicine)  CHIEF COMPLAINT:  Chief Complaint  Patient presents with  . ITP    INTERVAL HISTORY: Patient returns to clinic today for further evaluation , laboratory work, and consideration of IVIG. She still feels persistently weak and fatigued.  Her back and neck pain have improved since pain clinic initiated her on a prednisone taper. She does not complain of easy bleeding or bruising today. She denies any fevers. She has no chest pain or shortness of breath. She denies any nausea, vomiting, constipation, or diarrhea. She does not have any melena or hematochezia. Patient offers no further specific complaints today.   REVIEW OF SYSTEMS:   Review of Systems  Constitutional: Positive for malaise/fatigue.  Respiratory: Negative.   Cardiovascular: Negative.   Gastrointestinal: Negative.   Musculoskeletal: Positive for back pain, joint pain and neck pain.  Neurological: Positive for weakness.  Endo/Heme/Allergies: Negative.  Does not bruise/bleed easily.    As per HPI. Otherwise, a complete review of systems is negatve.  ONCOLOGY HISTORY:  No history exists.    PAST MEDICAL HISTORY: Past Medical History  Diagnosis Date  . Hypercholesteremia   . ITP (idiopathic thrombocytopenic purpura)   . Restless legs   . Anemia   . Depression   . Osteoarthritis   . GERD (gastroesophageal reflux disease)   . Hiatal hernia   . Chronic back pain   . Asthma   . Osteoporosis   . Depression   . Anxiety   . Pulmonary emboli ([place])     PAST SURGICAL HISTORY: Past Surgical History  Procedure Laterality Date  . Splenectomy, partial    . Total abdominal hysterectomy    . Lumbar disc surgery    . Carpal tunnel release    . Esophagogastroduodenoscopy (egd) with propofol  N/A 09/10/2015    Procedure: ESOPHAGOGASTRODUODENOSCOPY (EGD) WITH PROPOFOL;  Surgeon: Lollie Sails, MD;  Location: [facility] ENDOSCOPY;  Service: Endoscopy;  Laterality: N/A;    FAMILY HISTORY Family History  Problem Relation Age of Onset  . Stroke Mother       HEALTH MAINTENANCE: Social History  Substance Use Topics  . Smoking status: Never Smoker   . Smokeless tobacco: Never Used  . Alcohol Use: No    Allergies  Allergen Reactions  . Aspirin     Other reaction(s): Distress (finding)  . Diazepam     Other reaction(s): Itching of Skin sneezing  . Morphine     Other reaction(s): Itching of Skin  . Other     Other reaction(s): Unknown seasonal allergies  . Tetanus Toxoids     Other reaction(s): Localized superficial swelling of skin    Current Outpatient Prescriptions  Medication Sig Dispense Refill  . acetaminophen (TYLENOL) 325 MG tablet Take 2 tablets (650 mg total) by mouth every 6 (six) hours as needed for mild pain (or Fever >/= 101). 100 tablet 0  . albuterol (PROVENTIL HFA;VENTOLIN HFA) 108 (90 BASE) MCG/ACT inhaler Inhale 2 puffs into the lungs every 6 (six) hours as needed for wheezing or shortness of breath.    . ALPRAZolam (XANAX) 0.25 MG tablet TAKE 1 TABLET TWICE DAILY AS NEEDED FOR SLEEP    . BUDESONIDE-FORMOTEROL FUMARATE IN Inhale 2 puffs into the lungs 2 (two) times daily as needed (for shortness of breath and wheezing.).     Marland Kitchen  buPROPion (WELLBUTRIN XL) 150 MG 24 hr tablet Take by mouth.    . busPIRone (BUSPAR) 15 MG tablet Take by mouth.    . Calcium Carbonate-Vitamin D (CALCIUM 600+D) 600-400 MG-UNIT per tablet Take 1 tablet by mouth daily.    . citalopram (CELEXA) 40 MG tablet Take 40 mg by mouth daily.    . Cyanocobalamin (RA VITAMIN B-12 TR) 1000 MCG TBCR Take 1,000 mcg by mouth daily.     Marland Kitchen docusate sodium (COLACE) 100 MG capsule Take 1 capsule (100 mg total) by mouth 2 (two) times daily as needed for mild constipation. 10 capsule 0  . donepezil  (ARICEPT) 5 MG tablet Take 5 mg by mouth daily.    Marland Kitchen gabapentin (NEURONTIN) 300 MG capsule 300mg  orally 4 times a day    . HYDROcodone-acetaminophen (NORCO/VICODIN) 5-325 MG per tablet Reported on 10/17/2015    . levothyroxine (SYNTHROID, LEVOTHROID) 50 MCG tablet Take 50 mcg by mouth daily before breakfast. Take 30 to 60 minutes before breakfast.    . memantine (NAMENDA) 5 MG tablet Take by mouth.    . Multiple Vitamins-Minerals (OCUVITE-LUTEIN PO) Take 1 capsule by mouth daily.     . ondansetron (ZOFRAN) 4 MG tablet Take 1 tablet (4 mg total) by mouth every 6 (six) hours as needed for nausea. 20 tablet 0  . oxybutynin (DITROPAN) 5 MG tablet Take by mouth.    . pantoprazole (PROTONIX) 20 MG tablet Take by mouth.    . pravastatin (PRAVACHOL) 20 MG tablet Take 20 mg by mouth daily.    . ranitidine (ZANTAC) 300 MG tablet Take by mouth.    . senna (SENOKOT) 8.6 MG TABS tablet Take 1 tablet (8.6 mg total) by mouth daily as needed for mild constipation. 120 each 0  . traMADol (ULTRAM) 50 MG tablet Take 50 mg by mouth 3 (three) times daily as needed for moderate pain or severe pain. pain    . venlafaxine XR (EFFEXOR-XR) 37.5 MG 24 hr capsule Take 1 capsule by mouth daily.     . vitamin E (E-400) 400 UNIT capsule Take 400 Units by mouth daily.    Marland Kitchen zolpidem (AMBIEN) 5 MG tablet Take 5 mg by mouth at bedtime as needed for sleep.    Marland Kitchen eltrombopag (PROMACTA) 50 MG tablet Take 1 tablet (50 mg total) by mouth daily. Take on an empty stomach 1 hour before a meal or 2 hours after (Patient not taking: Reported on 12/24/2015) 30 tablet 4   No current facility-administered medications for this visit.    OBJECTIVE: BP 134/79 mmHg  Pulse 61  Temp(Src) 98.1 F (36.7 C) (Tympanic)  Resp 18  Wt 146 lb 2.6 oz (66.3 kg)   Body mass index is 24.32 kg/(m^2).    ECOG FS:0 - Asymptomatic  General: Well-developed, well-nourished, no acute distress. Eyes: Pink conjunctiva, anicteric sclera. Lungs: Clear to  auscultation bilaterally. Heart: Regular rate and rhythm. No rubs, murmurs, or gallops. Abdomen: Soft, nontender, nondistended. No organomegaly noted, normoactive bowel sounds. Musculoskeletal: No edema, cyanosis, or clubbing. Neuro: Alert, answering all questions appropriately. Cranial nerves grossly intact. Skin: No rashes or petechiae noted. Psych: Normal affect.   LAB RESULTS:  Appointment on 12/24/2015  Component Date Value Ref Range Status  . WBC 12/24/2015 11.6* 3.6 - 11.0 K/uL Final  . RBC 12/24/2015 4.05  3.80 - 5.20 MIL/uL Final  . Hemoglobin 12/24/2015 13.3  12.0 - 16.0 g/dL Final  . HCT 12/24/2015 38.9  35.0 - 47.0 % Final  .  MCV 12/24/2015 95.9  80.0 - 100.0 fL Final  . MCH 12/24/2015 32.8  26.0 - 34.0 pg Final  . MCHC 12/24/2015 34.2  32.0 - 36.0 g/dL Final  . RDW 12/24/2015 15.7* 11.5 - 14.5 % Final  . Platelets 12/24/2015 31* 150 - 440 K/uL Final  . Neutrophils Relative % 12/24/2015 68   Final  . Neutro Abs 12/24/2015 8.0* 1.4 - 6.5 K/uL Final  . Lymphocytes Relative 12/24/2015 22   Final  . Lymphs Abs 12/24/2015 2.5  1.0 - 3.6 K/uL Final  . Monocytes Relative 12/24/2015 9   Final  . Monocytes Absolute 12/24/2015 1.0* 0.2 - 0.9 K/uL Final  . Eosinophils Relative 12/24/2015 0   Final  . Eosinophils Absolute 12/24/2015 0.0  0 - 0.7 K/uL Final  . Basophils Relative 12/24/2015 1   Final  . Basophils Absolute 12/24/2015 0.1  0 - 0.1 K/uL Final    STUDIES: No results found.  ASSESSMENT & PLAN:    1. Chronic refractory ITP: Patient had a poor response to Prednisone, WinRho, and Rituxan. She is status post splenectomy in 2007. She was previously on Nplate and her response was labile at best. She responds to IVIG, although this is not very durable only lasting now 2 weeks. Will increase the dose again today to see if we can get more durable results. Proceed with 0.7mg /kg IVIG today and tomorrow. Return to clinic in 1 week for laboratory work and then in 2 weeks for  further evaluation and consideration of IVIG. Promacta was once again denied by insurance.  Patient expressed understanding and was in agreement with this plan. She also understands that She can call clinic at any time with any questions, concerns, or complaints.    Lloyd Huger, MD   12/30/2015 11:30 PM

## 2015-12-31 ENCOUNTER — Inpatient Hospital Stay: Payer: Commercial Managed Care - HMO

## 2015-12-31 DIAGNOSIS — R531 Weakness: Secondary | ICD-10-CM | POA: Diagnosis not present

## 2015-12-31 DIAGNOSIS — R5383 Other fatigue: Secondary | ICD-10-CM | POA: Diagnosis not present

## 2015-12-31 DIAGNOSIS — D693 Immune thrombocytopenic purpura: Secondary | ICD-10-CM | POA: Diagnosis not present

## 2015-12-31 DIAGNOSIS — M549 Dorsalgia, unspecified: Secondary | ICD-10-CM | POA: Diagnosis not present

## 2015-12-31 DIAGNOSIS — R5381 Other malaise: Secondary | ICD-10-CM | POA: Diagnosis not present

## 2015-12-31 DIAGNOSIS — Z9081 Acquired absence of spleen: Secondary | ICD-10-CM | POA: Diagnosis not present

## 2015-12-31 DIAGNOSIS — M542 Cervicalgia: Secondary | ICD-10-CM | POA: Diagnosis not present

## 2015-12-31 DIAGNOSIS — G8929 Other chronic pain: Secondary | ICD-10-CM | POA: Diagnosis not present

## 2015-12-31 DIAGNOSIS — Z79899 Other long term (current) drug therapy: Secondary | ICD-10-CM | POA: Diagnosis not present

## 2015-12-31 LAB — CBC WITH DIFFERENTIAL/PLATELET
BASOS ABS: 0.1 10*3/uL (ref 0–0.1)
BASOS PCT: 1 %
Eosinophils Absolute: 0.1 10*3/uL (ref 0–0.7)
Eosinophils Relative: 1 %
HEMATOCRIT: 39.2 % (ref 35.0–47.0)
HEMOGLOBIN: 13.3 g/dL (ref 12.0–16.0)
LYMPHS PCT: 30 %
Lymphs Abs: 2.9 10*3/uL (ref 1.0–3.6)
MCH: 32.6 pg (ref 26.0–34.0)
MCHC: 34 g/dL (ref 32.0–36.0)
MCV: 96.1 fL (ref 80.0–100.0)
MONO ABS: 0.7 10*3/uL (ref 0.2–0.9)
Monocytes Relative: 7 %
NEUTROS ABS: 5.7 10*3/uL (ref 1.4–6.5)
NEUTROS PCT: 61 %
Platelets: 164 10*3/uL (ref 150–440)
RBC: 4.08 MIL/uL (ref 3.80–5.20)
RDW: 15.4 % — AB (ref 11.5–14.5)
WBC: 9.4 10*3/uL (ref 3.6–11.0)

## 2016-01-02 ENCOUNTER — Other Ambulatory Visit: Payer: Self-pay | Admitting: Oncology

## 2016-01-02 NOTE — Telephone Encounter (Signed)
Great!  Thank you! Please be sure she see's Brahmanday next week when I'm OOT.

## 2016-01-02 NOTE — Telephone Encounter (Signed)
Promacta was approved from 12/27/15 thru 06/28/16.  [employer] is aware and they reached out to the patient yesterday to obtain additional information to submit with co-pay assistance.

## 2016-01-07 ENCOUNTER — Inpatient Hospital Stay (HOSPITAL_BASED_OUTPATIENT_CLINIC_OR_DEPARTMENT_OTHER): Payer: Commercial Managed Care - HMO | Admitting: Family Medicine

## 2016-01-07 ENCOUNTER — Inpatient Hospital Stay: Payer: Commercial Managed Care - HMO

## 2016-01-07 VITALS — BP 138/73 | HR 59 | Temp 97.5°F | Resp 18 | Ht 65.0 in | Wt 147.3 lb

## 2016-01-07 DIAGNOSIS — R531 Weakness: Secondary | ICD-10-CM | POA: Diagnosis not present

## 2016-01-07 DIAGNOSIS — D693 Immune thrombocytopenic purpura: Secondary | ICD-10-CM

## 2016-01-07 DIAGNOSIS — M1990 Unspecified osteoarthritis, unspecified site: Secondary | ICD-10-CM

## 2016-01-07 DIAGNOSIS — M542 Cervicalgia: Secondary | ICD-10-CM | POA: Diagnosis not present

## 2016-01-07 DIAGNOSIS — Z9081 Acquired absence of spleen: Secondary | ICD-10-CM | POA: Diagnosis not present

## 2016-01-07 DIAGNOSIS — G8929 Other chronic pain: Secondary | ICD-10-CM | POA: Diagnosis not present

## 2016-01-07 DIAGNOSIS — R5381 Other malaise: Secondary | ICD-10-CM | POA: Diagnosis not present

## 2016-01-07 DIAGNOSIS — M549 Dorsalgia, unspecified: Secondary | ICD-10-CM | POA: Diagnosis not present

## 2016-01-07 DIAGNOSIS — R5383 Other fatigue: Secondary | ICD-10-CM | POA: Diagnosis not present

## 2016-01-07 DIAGNOSIS — Z7952 Long term (current) use of systemic steroids: Secondary | ICD-10-CM

## 2016-01-07 DIAGNOSIS — Z79899 Other long term (current) drug therapy: Secondary | ICD-10-CM

## 2016-01-07 DIAGNOSIS — E7800 Pure hypercholesterolemia, unspecified: Secondary | ICD-10-CM

## 2016-01-07 LAB — CBC WITH DIFFERENTIAL/PLATELET
Basophils Absolute: 0 10*3/uL (ref 0–0.1)
Eosinophils Absolute: 0.1 10*3/uL (ref 0–0.7)
Eosinophils Relative: 1 %
HEMATOCRIT: 38.7 % (ref 35.0–47.0)
HEMOGLOBIN: 13.1 g/dL (ref 12.0–16.0)
Lymphs Abs: 2.5 10*3/uL (ref 1.0–3.6)
MCH: 32.5 pg (ref 26.0–34.0)
MCHC: 33.7 g/dL (ref 32.0–36.0)
MCV: 96.3 fL (ref 80.0–100.0)
MONO ABS: 0.5 10*3/uL (ref 0.2–0.9)
NEUTROS ABS: 3.9 10*3/uL (ref 1.4–6.5)
Platelets: 32 10*3/uL — ABNORMAL LOW (ref 150–440)
RBC: 4.02 MIL/uL (ref 3.80–5.20)
RDW: 15.7 % — ABNORMAL HIGH (ref 11.5–14.5)
WBC: 6.9 10*3/uL (ref 3.6–11.0)

## 2016-01-07 MED ORDER — IMMUNE GLOBULIN (HUMAN) 10 GM/100ML IV SOLN
0.7000 g/kg | Freq: Once | INTRAVENOUS | Status: AC
  Administered 2016-01-07: 45 g via INTRAVENOUS
  Filled 2016-01-07: qty 380

## 2016-01-07 MED ORDER — ACETAMINOPHEN 325 MG PO TABS
650.0000 mg | ORAL_TABLET | Freq: Four times a day (QID) | ORAL | Status: DC | PRN
  Administered 2016-01-07: 650 mg via ORAL
  Filled 2016-01-07: qty 2

## 2016-01-07 MED ORDER — DIPHENHYDRAMINE HCL 25 MG PO TABS
25.0000 mg | ORAL_TABLET | Freq: Four times a day (QID) | ORAL | Status: DC | PRN
  Administered 2016-01-07: 25 mg via ORAL
  Filled 2016-01-07: qty 1

## 2016-01-07 MED ORDER — DIPHENHYDRAMINE HCL 25 MG PO CAPS
ORAL_CAPSULE | ORAL | Status: AC
  Filled 2016-01-07: qty 1

## 2016-01-07 MED ORDER — SODIUM CHLORIDE 0.9 % IV SOLN
Freq: Once | INTRAVENOUS | Status: AC
  Administered 2016-01-07: 10:00:00 via INTRAVENOUS
  Filled 2016-01-07: qty 1000

## 2016-01-07 NOTE — Progress Notes (Signed)
Ariana [name]  [phone]  Fax:(336) [phone]     Ariana [name] DOB: 1933-04-25  MR#: [id number]  [id number]  Patient Care Team: Idelle Crouch, MD as PCP - General (Internal Medicine)  CHIEF COMPLAINT:  Chief Complaint  Patient presents with  . ITP  . IV Medication    IVIG    INTERVAL HISTORY: Patient returns to clinic today for further evaluation , laboratory work, and consideration of IVIG. She still feels persistently weak and fatigued. Her back and neck pain have improved since pain clinic initiated her on a prednisone taper. She does not complain of easy bleeding or bruising today. She denies any fevers. She has no chest pain or shortness of breath. She denies any nausea, vomiting, constipation, or diarrhea. She does not have any melena or hematochezia. Patient offers no further specific complaints today.  REVIEW OF SYSTEMS:   Review of Systems  Constitutional: Positive for malaise/fatigue. Negative for fever, chills, weight loss and diaphoresis.  HENT: Negative.   Eyes: Negative.   Respiratory: Negative for cough, hemoptysis, sputum production, shortness of breath and wheezing.   Cardiovascular: Negative for chest pain, palpitations, orthopnea, claudication, leg swelling and PND.  Gastrointestinal: Negative for heartburn, nausea, vomiting, abdominal pain, diarrhea, constipation, blood in stool and melena.  Genitourinary: Negative.   Musculoskeletal: Negative.   Skin: Negative.   Neurological: Negative for dizziness, tingling, focal weakness, seizures and weakness.  Endo/Heme/Allergies: Bruises/bleeds easily.  Psychiatric/Behavioral: Negative for depression. The patient is not nervous/anxious and does not have insomnia.     As per HPI. Otherwise, a complete review of systems is negatve.  PAST MEDICAL HISTORY: Past Medical History  Diagnosis Date  . Hypercholesteremia   . ITP (idiopathic thrombocytopenic purpura)   .  Restless legs   . Anemia   . Depression   . Osteoarthritis   . GERD (gastroesophageal reflux disease)   . Hiatal hernia   . Chronic back pain   . Asthma   . Osteoporosis   . Depression   . Anxiety   . Pulmonary emboli ([place])     PAST SURGICAL HISTORY: Past Surgical History  Procedure Laterality Date  . Splenectomy, partial    . Total abdominal hysterectomy    . Lumbar disc surgery    . Carpal tunnel release    . Esophagogastroduodenoscopy (egd) with propofol N/A 09/10/2015    Procedure: ESOPHAGOGASTRODUODENOSCOPY (EGD) WITH PROPOFOL;  Surgeon: Lollie Sails, MD;  Location: [facility] ENDOSCOPY;  Service: Endoscopy;  Laterality: N/A;    FAMILY HISTORY Family History  Problem Relation Age of Onset  . Stroke Mother     GYNECOLOGIC HISTORY:  No LMP recorded. Patient is postmenopausal.     ADVANCED DIRECTIVES:    HEALTH MAINTENANCE: Social History  Substance Use Topics  . Smoking status: Never Smoker   . Smokeless tobacco: Never Used  . Alcohol Use: No     Allergies  Allergen Reactions  . Aspirin     Other reaction(s): Distress (finding)  . Diazepam     Other reaction(s): Itching of Skin sneezing  . Morphine     Other reaction(s): Itching of Skin  . Other     Other reaction(s): Unknown seasonal allergies  . Tetanus Toxoids     Other reaction(s): Localized superficial swelling of skin    Current Outpatient Prescriptions  Medication Sig Dispense Refill  . acetaminophen (TYLENOL) 325 MG tablet Take 2 tablets (650 mg total) by mouth every 6 (six) hours as needed for mild pain (  or Fever >/= 101). 100 tablet 0  . albuterol (PROVENTIL HFA;VENTOLIN HFA) 108 (90 BASE) MCG/ACT inhaler Inhale 2 puffs into the lungs every 6 (six) hours as needed for wheezing or shortness of breath.    . ALPRAZolam (XANAX) 0.25 MG tablet TAKE 1 TABLET TWICE DAILY AS NEEDED FOR SLEEP    . BUDESONIDE-FORMOTEROL FUMARATE IN Inhale 2 puffs into the lungs 2 (two) times daily as needed (for  shortness of breath and wheezing.).     Marland Kitchen buPROPion (WELLBUTRIN XL) 150 MG 24 hr tablet Take by mouth.    . busPIRone (BUSPAR) 15 MG tablet Take by mouth.    . Calcium Carbonate-Vitamin D (CALCIUM 600+D) 600-400 MG-UNIT per tablet Take 1 tablet by mouth daily.    . citalopram (CELEXA) 40 MG tablet Take 40 mg by mouth daily.    . Cyanocobalamin (RA VITAMIN B-12 TR) 1000 MCG TBCR Take 1,000 mcg by mouth daily.     Marland Kitchen docusate sodium (COLACE) 100 MG capsule Take 1 capsule (100 mg total) by mouth 2 (two) times daily as needed for mild constipation. 10 capsule 0  . donepezil (ARICEPT) 5 MG tablet Take 5 mg by mouth daily.    Marland Kitchen eltrombopag (PROMACTA) 50 MG tablet Take 1 tablet (50 mg total) by mouth daily. Take on an empty stomach 1 hour before a meal or 2 hours after 30 tablet 4  . gabapentin (NEURONTIN) 300 MG capsule 300mg  orally 4 times a day    . HYDROcodone-acetaminophen (NORCO/VICODIN) 5-325 MG per tablet Reported on 10/17/2015    . levothyroxine (SYNTHROID, LEVOTHROID) 50 MCG tablet Take 50 mcg by mouth daily before breakfast. Take 30 to 60 minutes before breakfast.    . memantine (NAMENDA) 5 MG tablet Take by mouth.    . Multiple Vitamins-Minerals (OCUVITE-LUTEIN PO) Take 1 capsule by mouth daily.     . ondansetron (ZOFRAN) 4 MG tablet Take 1 tablet (4 mg total) by mouth every 6 (six) hours as needed for nausea. 20 tablet 0  . oxybutynin (DITROPAN) 5 MG tablet Take by mouth.    . pantoprazole (PROTONIX) 20 MG tablet Take by mouth.    . pravastatin (PRAVACHOL) 20 MG tablet Take 20 mg by mouth daily.    . ranitidine (ZANTAC) 300 MG tablet Take by mouth.    . senna (SENOKOT) 8.6 MG TABS tablet Take 1 tablet (8.6 mg total) by mouth daily as needed for mild constipation. 120 each 0  . traMADol (ULTRAM) 50 MG tablet Take 50 mg by mouth 3 (three) times daily as needed for moderate pain or severe pain. pain    . venlafaxine XR (EFFEXOR-XR) 37.5 MG 24 hr capsule Take 1 capsule by mouth daily.     .  vitamin E (E-400) 400 UNIT capsule Take 400 Units by mouth daily.    Marland Kitchen zolpidem (AMBIEN) 5 MG tablet Take 5 mg by mouth at bedtime as needed for sleep.     No current facility-administered medications for this visit.   Facility-Administered Medications Ordered in Other Visits  Medication Dose Route Frequency Provider Last Rate Last Dose  . 0.9 %  sodium chloride infusion   Intravenous Once Lloyd Huger, MD      . acetaminophen (TYLENOL) tablet 650 mg  650 mg Oral Q6H PRN Lloyd Huger, MD      . diphenhydrAMINE (BENADRYL) tablet 25 mg  25 mg Oral Q6H PRN Lloyd Huger, MD      . Immune Globulin 10% (OCTAGAM) IV  infusion 45 g  0.7 g/kg Intravenous Once Lloyd Huger, MD        OBJECTIVE: BP 138/73 mmHg  Pulse 59  Temp(Src) 97.5 F (36.4 C) (Tympanic)  Resp 18  Ht 5\' 5"  (1.651 m)  Wt 147 lb 4.3 oz (66.8 kg)  BMI 24.51 kg/m2   Body mass index is 24.51 kg/(m^2).    ECOG FS:1 - Symptomatic but completely ambulatory  General: Well-developed, well-nourished, no acute distress. Eyes: Pink conjunctiva, anicteric sclera. HEENT: Normocephalic, moist mucous membranes, clear oropharnyx. Lungs: Clear to auscultation bilaterally. Heart: Regular rate and rhythm. No rubs, murmurs, or gallops. Musculoskeletal: No edema, cyanosis, or clubbing. Neuro: Alert, answering all questions appropriately. Cranial nerves grossly intact. Skin: No rashes or petechiae noted. Bruising noted in various stages of healing. Psych: Normal affect.    LAB RESULTS:  Appointment on 01/07/2016  Component Date Value Ref Range Status  . WBC 01/07/2016 6.9  3.6 - 11.0 K/uL Final  . RBC 01/07/2016 4.02  3.80 - 5.20 MIL/uL Final  . Hemoglobin 01/07/2016 13.1  12.0 - 16.0 g/dL Final  . HCT 01/07/2016 38.7  35.0 - 47.0 % Final  . MCV 01/07/2016 96.3  80.0 - 100.0 fL Final  . MCH 01/07/2016 32.5  26.0 - 34.0 pg Final  . MCHC 01/07/2016 33.7  32.0 - 36.0 g/dL Final  . RDW 01/07/2016 15.7* 11.5 - 14.5 %  Final  . Platelets 01/07/2016 32* 150 - 440 K/uL Final  . Neutrophils Relative % 01/07/2016 55%   Final  . Neutro Abs 01/07/2016 3.9  1.4 - 6.5 K/uL Final  . Lymphocytes Relative 01/07/2016 36%   Final  . Lymphs Abs 01/07/2016 2.5  1.0 - 3.6 K/uL Final  . Monocytes Relative 01/07/2016 7%   Final  . Monocytes Absolute 01/07/2016 0.5  0.2 - 0.9 K/uL Final  . Eosinophils Relative 01/07/2016 1%   Final  . Eosinophils Absolute 01/07/2016 0.1  0 - 0.7 K/uL Final  . Basophils Relative 01/07/2016 1%   Final  . Basophils Absolute 01/07/2016 0.0  0 - 0.1 K/uL Final    STUDIES: No results found.  ASSESSMENT:  ITP.  PLAN:   1. Chronic refractory ITP. Patient had a poor response to Prednisone, WinRho, and Rituxan. She is status post splenectomy in 2007. She was previously on Nplate and her response was labile at best. Platelets of 32K today. She responds to IVIG, although this is not very durable only lasting now 2 weeks. Will increase the dose again today to see if we can get more durable results. Proceed with 0.7mg /kg IVIG today and tomorrow.   Patient has Promacta 50mg  daily scheduled for delivery today. She was instructed to start taking medication tonight as there are medication interactions with some of her other meds.   We will recheck labs in 1 week.  Patient expressed understanding and was in agreement with this plan. She also understands that She can call clinic at any time with any questions, concerns, or complaints.   Dr. Oliva Bustard was available for consultation and review of plan of care for this patient.  Evlyn Kanner, NP   01/07/2016 10:04 AM

## 2016-01-08 ENCOUNTER — Inpatient Hospital Stay: Payer: Commercial Managed Care - HMO

## 2016-01-08 VITALS — BP 136/75 | HR 55 | Temp 96.4°F | Resp 18

## 2016-01-08 DIAGNOSIS — Z9081 Acquired absence of spleen: Secondary | ICD-10-CM | POA: Diagnosis not present

## 2016-01-08 DIAGNOSIS — R531 Weakness: Secondary | ICD-10-CM | POA: Diagnosis not present

## 2016-01-08 DIAGNOSIS — Z79899 Other long term (current) drug therapy: Secondary | ICD-10-CM | POA: Diagnosis not present

## 2016-01-08 DIAGNOSIS — R5383 Other fatigue: Secondary | ICD-10-CM | POA: Diagnosis not present

## 2016-01-08 DIAGNOSIS — M549 Dorsalgia, unspecified: Secondary | ICD-10-CM | POA: Diagnosis not present

## 2016-01-08 DIAGNOSIS — M542 Cervicalgia: Secondary | ICD-10-CM | POA: Diagnosis not present

## 2016-01-08 DIAGNOSIS — G8929 Other chronic pain: Secondary | ICD-10-CM | POA: Diagnosis not present

## 2016-01-08 DIAGNOSIS — D693 Immune thrombocytopenic purpura: Secondary | ICD-10-CM

## 2016-01-08 DIAGNOSIS — R5381 Other malaise: Secondary | ICD-10-CM | POA: Diagnosis not present

## 2016-01-08 MED ORDER — IMMUNE GLOBULIN (HUMAN) 10 GM/100ML IV SOLN
0.7000 g/kg | Freq: Once | INTRAVENOUS | Status: AC
  Administered 2016-01-08: 45 g via INTRAVENOUS
  Filled 2016-01-08: qty 450

## 2016-01-08 MED ORDER — SODIUM CHLORIDE 0.9 % IV SOLN
Freq: Once | INTRAVENOUS | Status: AC
  Administered 2016-01-08: 09:00:00 via INTRAVENOUS
  Filled 2016-01-08: qty 1000

## 2016-01-08 MED ORDER — DIPHENHYDRAMINE HCL 25 MG PO TABS
25.0000 mg | ORAL_TABLET | Freq: Four times a day (QID) | ORAL | Status: DC | PRN
  Administered 2016-01-08: 25 mg via ORAL
  Filled 2016-01-08: qty 1

## 2016-01-08 MED ORDER — ACETAMINOPHEN 325 MG PO TABS
650.0000 mg | ORAL_TABLET | Freq: Four times a day (QID) | ORAL | Status: DC | PRN
  Administered 2016-01-08: 650 mg via ORAL
  Filled 2016-01-08: qty 2

## 2016-01-14 ENCOUNTER — Inpatient Hospital Stay (HOSPITAL_BASED_OUTPATIENT_CLINIC_OR_DEPARTMENT_OTHER): Payer: Commercial Managed Care - HMO | Admitting: Oncology

## 2016-01-14 ENCOUNTER — Inpatient Hospital Stay: Payer: Commercial Managed Care - HMO | Attending: Internal Medicine

## 2016-01-14 VITALS — BP 133/69 | HR 70 | Temp 98.1°F | Resp 16 | Wt 143.7 lb

## 2016-01-14 DIAGNOSIS — M549 Dorsalgia, unspecified: Secondary | ICD-10-CM | POA: Diagnosis not present

## 2016-01-14 DIAGNOSIS — R5383 Other fatigue: Secondary | ICD-10-CM | POA: Diagnosis not present

## 2016-01-14 DIAGNOSIS — R197 Diarrhea, unspecified: Secondary | ICD-10-CM | POA: Diagnosis not present

## 2016-01-14 DIAGNOSIS — R21 Rash and other nonspecific skin eruption: Secondary | ICD-10-CM | POA: Insufficient documentation

## 2016-01-14 DIAGNOSIS — R630 Anorexia: Secondary | ICD-10-CM | POA: Diagnosis not present

## 2016-01-14 DIAGNOSIS — Z79899 Other long term (current) drug therapy: Secondary | ICD-10-CM | POA: Insufficient documentation

## 2016-01-14 DIAGNOSIS — G8929 Other chronic pain: Secondary | ICD-10-CM

## 2016-01-14 DIAGNOSIS — G4700 Insomnia, unspecified: Secondary | ICD-10-CM | POA: Diagnosis not present

## 2016-01-14 DIAGNOSIS — K219 Gastro-esophageal reflux disease without esophagitis: Secondary | ICD-10-CM | POA: Diagnosis not present

## 2016-01-14 DIAGNOSIS — Z9081 Acquired absence of spleen: Secondary | ICD-10-CM

## 2016-01-14 DIAGNOSIS — D693 Immune thrombocytopenic purpura: Secondary | ICD-10-CM

## 2016-01-14 DIAGNOSIS — F418 Other specified anxiety disorders: Secondary | ICD-10-CM | POA: Insufficient documentation

## 2016-01-14 DIAGNOSIS — E7800 Pure hypercholesterolemia, unspecified: Secondary | ICD-10-CM | POA: Diagnosis not present

## 2016-01-14 DIAGNOSIS — M1990 Unspecified osteoarthritis, unspecified site: Secondary | ICD-10-CM | POA: Insufficient documentation

## 2016-01-14 DIAGNOSIS — Z86711 Personal history of pulmonary embolism: Secondary | ICD-10-CM | POA: Diagnosis not present

## 2016-01-14 DIAGNOSIS — R531 Weakness: Secondary | ICD-10-CM | POA: Insufficient documentation

## 2016-01-14 DIAGNOSIS — R109 Unspecified abdominal pain: Secondary | ICD-10-CM | POA: Diagnosis not present

## 2016-01-14 DIAGNOSIS — M810 Age-related osteoporosis without current pathological fracture: Secondary | ICD-10-CM | POA: Diagnosis not present

## 2016-01-14 DIAGNOSIS — R110 Nausea: Secondary | ICD-10-CM | POA: Insufficient documentation

## 2016-01-14 DIAGNOSIS — R5381 Other malaise: Secondary | ICD-10-CM | POA: Diagnosis not present

## 2016-01-14 LAB — CBC WITH DIFFERENTIAL/PLATELET
BASOS ABS: 0 10*3/uL (ref 0–0.1)
Basophils Relative: 1 %
Eosinophils Absolute: 0 10*3/uL (ref 0–0.7)
Eosinophils Relative: 1 %
HEMATOCRIT: 41.5 % (ref 35.0–47.0)
Hemoglobin: 14.2 g/dL (ref 12.0–16.0)
LYMPHS ABS: 1.7 10*3/uL (ref 1.0–3.6)
LYMPHS PCT: 28 %
MCH: 32.9 pg (ref 26.0–34.0)
MCHC: 34.2 g/dL (ref 32.0–36.0)
MCV: 96.1 fL (ref 80.0–100.0)
MONO ABS: 0.5 10*3/uL (ref 0.2–0.9)
Monocytes Relative: 8 %
NEUTROS ABS: 3.8 10*3/uL (ref 1.4–6.5)
Neutrophils Relative %: 62 %
Platelets: 349 10*3/uL (ref 150–440)
RBC: 4.32 MIL/uL (ref 3.80–5.20)
RDW: 15.2 % — ABNORMAL HIGH (ref 11.5–14.5)
WBC: 6.1 10*3/uL (ref 3.6–11.0)

## 2016-01-14 NOTE — Progress Notes (Signed)
Patient started the Promacta 1 week ago today and she has been having side effects which include nausea with no appetite, diarrhea, weakness, abdominal discomfort, and insomnia.  The diarrhea has now improved but still having significant nausea with no appetite, the abdominal discomfort has slightly improved, and having weakness.  They did call the contact number given on medication and was advised that her body will need to get adjusted to the medication and that it is available in a smaller dose.

## 2016-01-16 NOTE — Progress Notes (Signed)
[facility]  Telephone:(336) [phone]  Fax:(336) [phone]     Ariana [name] DOB: 08/21/1933  MR#: [id number]  [id number]  Patient Care Team: Idelle Crouch, MD as PCP - General (Internal Medicine)  CHIEF COMPLAINT:  Chief Complaint  Patient presents with  . Acute Visit    INTERVAL HISTORY: Patient returns to clinic today as an add-on with multiple complaints since starting Promacta one week ago. She has increased nausea and diarrhea without appetite. She has increased weakness and fatigue, abdominal discomfort, and insomnia. She does not complain of easy bleeding or bruising today. She denies any fevers. She has no chest pain or shortness of breath. Patient offers no further specific complaints today.  REVIEW OF SYSTEMS:   Review of Systems  Constitutional: Positive for malaise/fatigue. Negative for fever and weight loss.  Respiratory: Negative.  Negative for shortness of breath.   Cardiovascular: Negative.  Negative for chest pain.  Gastrointestinal: Positive for nausea, abdominal pain and diarrhea. Negative for blood in stool and melena.  Genitourinary: Negative.   Musculoskeletal: Negative.   Skin: Negative.   Neurological: Positive for weakness.    As per HPI. Otherwise, a complete review of systems is negatve.  PAST MEDICAL HISTORY: Past Medical History  Diagnosis Date  . Hypercholesteremia   . ITP (idiopathic thrombocytopenic purpura)   . Restless legs   . Anemia   . Depression   . Osteoarthritis   . GERD (gastroesophageal reflux disease)   . Hiatal hernia   . Chronic back pain   . Asthma   . Osteoporosis   . Depression   . Anxiety   . Pulmonary emboli ([place])     PAST SURGICAL HISTORY: Past Surgical History  Procedure Laterality Date  . Splenectomy, partial    . Total abdominal hysterectomy    . Lumbar disc surgery    . Carpal tunnel release    . Esophagogastroduodenoscopy (egd) with propofol N/A 09/10/2015   Procedure: ESOPHAGOGASTRODUODENOSCOPY (EGD) WITH PROPOFOL;  Surgeon: Lollie Sails, MD;  Location: [facility] ENDOSCOPY;  Service: Endoscopy;  Laterality: N/A;    FAMILY HISTORY Family History  Problem Relation Age of Onset  . Stroke Mother     GYNECOLOGIC HISTORY:  No LMP recorded. Patient is postmenopausal.     ADVANCED DIRECTIVES:    HEALTH MAINTENANCE: Social History  Substance Use Topics  . Smoking status: Never Smoker   . Smokeless tobacco: Never Used  . Alcohol Use: No     Allergies  Allergen Reactions  . Aspirin     Other reaction(s): Distress (finding)  . Diazepam     Other reaction(s): Itching of Skin sneezing  . Morphine     Other reaction(s): Itching of Skin  . Other     Other reaction(s): Unknown seasonal allergies  . Tetanus Toxoids     Other reaction(s): Localized superficial swelling of skin    Current Outpatient Prescriptions  Medication Sig Dispense Refill  . acetaminophen (TYLENOL) 325 MG tablet Take 2 tablets (650 mg total) by mouth every 6 (six) hours as needed for mild pain (or Fever >/= 101). 100 tablet 0  . albuterol (PROVENTIL HFA;VENTOLIN HFA) 108 (90 BASE) MCG/ACT inhaler Inhale 2 puffs into the lungs every 6 (six) hours as needed for wheezing or shortness of breath.    . ALPRAZolam (XANAX) 0.25 MG tablet TAKE 1 TABLET TWICE DAILY AS NEEDED FOR SLEEP    . BUDESONIDE-FORMOTEROL FUMARATE IN Inhale 2 puffs into the lungs 2 (two) times daily as needed (  for shortness of breath and wheezing.).     Marland Kitchen buPROPion (WELLBUTRIN XL) 150 MG 24 hr tablet Take by mouth.    . busPIRone (BUSPAR) 15 MG tablet Take by mouth.    . Calcium Carbonate-Vitamin D (CALCIUM 600+D) 600-400 MG-UNIT per tablet Take 1 tablet by mouth daily.    . citalopram (CELEXA) 40 MG tablet Take 40 mg by mouth daily.    . Cyanocobalamin (RA VITAMIN B-12 TR) 1000 MCG TBCR Take 1,000 mcg by mouth daily.     Marland Kitchen docusate sodium (COLACE) 100 MG capsule Take 1 capsule (100 mg total) by mouth 2  (two) times daily as needed for mild constipation. 10 capsule 0  . donepezil (ARICEPT) 5 MG tablet Take 5 mg by mouth daily.    Marland Kitchen eltrombopag (PROMACTA) 50 MG tablet Take 1 tablet (50 mg total) by mouth daily. Take on an empty stomach 1 hour before a meal or 2 hours after 30 tablet 4  . gabapentin (NEURONTIN) 300 MG capsule 300mg  orally 4 times a day    . HYDROcodone-acetaminophen (NORCO/VICODIN) 5-325 MG per tablet Reported on 10/17/2015    . levothyroxine (SYNTHROID, LEVOTHROID) 50 MCG tablet Take 50 mcg by mouth daily before breakfast. Take 30 to 60 minutes before breakfast.    . memantine (NAMENDA) 5 MG tablet Take by mouth.    . Multiple Vitamins-Minerals (OCUVITE-LUTEIN PO) Take 1 capsule by mouth daily.     Marland Kitchen oxybutynin (DITROPAN) 5 MG tablet Take by mouth.    . pantoprazole (PROTONIX) 20 MG tablet Take by mouth.    . pravastatin (PRAVACHOL) 20 MG tablet Take 20 mg by mouth daily.    . ranitidine (ZANTAC) 300 MG tablet Take by mouth.    . senna (SENOKOT) 8.6 MG TABS tablet Take 1 tablet (8.6 mg total) by mouth daily as needed for mild constipation. 120 each 0  . traMADol (ULTRAM) 50 MG tablet Take 50 mg by mouth 3 (three) times daily as needed for moderate pain or severe pain. pain    . venlafaxine XR (EFFEXOR-XR) 37.5 MG 24 hr capsule Take 1 capsule by mouth daily.     . vitamin E (E-400) 400 UNIT capsule Take 400 Units by mouth daily.    Marland Kitchen zolpidem (AMBIEN) 5 MG tablet Take 5 mg by mouth at bedtime as needed for sleep.    Marland Kitchen ondansetron (ZOFRAN) 4 MG tablet Take 1 tablet (4 mg total) by mouth every 6 (six) hours as needed for nausea. (Patient not taking: Reported on 01/14/2016) 20 tablet 0   No current facility-administered medications for this visit.    OBJECTIVE: BP 133/69 mmHg  Pulse 70  Temp(Src) 98.1 F (36.7 C) (Tympanic)  Resp 16  Wt 143 lb 11.8 oz (65.2 kg)   Body mass index is 23.92 kg/(m^2).    ECOG FS:1 - Symptomatic but completely ambulatory  General: Well-developed,  well-nourished, no acute distress. Eyes: Pink conjunctiva, anicteric sclera. HEENT: Normocephalic, moist mucous membranes, clear oropharnyx. Lungs: Clear to auscultation bilaterally. Heart: Regular rate and rhythm. No rubs, murmurs, or gallops. Musculoskeletal: No edema, cyanosis, or clubbing. Neuro: Alert, answering all questions appropriately. Cranial nerves grossly intact. Skin: No rashes or petechiae noted. Bruising noted in various stages of healing. Psych: Normal affect.    LAB RESULTS:  Appointment on 01/14/2016  Component Date Value Ref Range Status  . WBC 01/14/2016 6.1  3.6 - 11.0 K/uL Final  . RBC 01/14/2016 4.32  3.80 - 5.20 MIL/uL Final  . Hemoglobin 01/14/2016  14.2  12.0 - 16.0 g/dL Final  . HCT 01/14/2016 41.5  35.0 - 47.0 % Final  . MCV 01/14/2016 96.1  80.0 - 100.0 fL Final  . MCH 01/14/2016 32.9  26.0 - 34.0 pg Final  . MCHC 01/14/2016 34.2  32.0 - 36.0 g/dL Final  . RDW 01/14/2016 15.2* 11.5 - 14.5 % Final  . Platelets 01/14/2016 349  150 - 440 K/uL Final  . Neutrophils Relative % 01/14/2016 62   Final  . Neutro Abs 01/14/2016 3.8  1.4 - 6.5 K/uL Final  . Lymphocytes Relative 01/14/2016 28   Final  . Lymphs Abs 01/14/2016 1.7  1.0 - 3.6 K/uL Final  . Monocytes Relative 01/14/2016 8   Final  . Monocytes Absolute 01/14/2016 0.5  0.2 - 0.9 K/uL Final  . Eosinophils Relative 01/14/2016 1   Final  . Eosinophils Absolute 01/14/2016 0.0  0 - 0.7 K/uL Final  . Basophils Relative 01/14/2016 1   Final  . Basophils Absolute 01/14/2016 0.0  0 - 0.1 K/uL Final    STUDIES: No results found.  ASSESSMENT:  ITP  PLAN:   1. Chronic refractory ITP: Patient had a poor response to Prednisone, WinRho, and Rituxan. She is status post splenectomy in 2007. She was previously on Nplate and her response was labile at best. She responds to IVIG, although this is not very durable only lasting now 2 weeks. Patient initiated 50 mg Promacta once a week with significant improvement of  her platelet count to 349. Unfortunately, all of her side effects listed in the history of present illness are likely secondary to the Promacta. Patient has been instructed to discontinue Promacta for 1 week. Return to clinic at that time for repeat laboratory work and reinitiation of Promacta at decreased dose of 25 mg daily.   Approximately 30 minutes was spent in discussion of which greater than 50% was consultation.  Patient expressed understanding and was in agreement with this plan. She also understands that She can call clinic at any time with any questions, concerns, or complaints.    Lloyd Huger, MD   01/16/2016 11:20 PM

## 2016-01-21 ENCOUNTER — Inpatient Hospital Stay: Payer: Commercial Managed Care - HMO

## 2016-01-21 ENCOUNTER — Inpatient Hospital Stay (HOSPITAL_BASED_OUTPATIENT_CLINIC_OR_DEPARTMENT_OTHER): Payer: Commercial Managed Care - HMO | Admitting: Oncology

## 2016-01-21 VITALS — BP 136/73 | HR 67 | Temp 97.3°F | Resp 18 | Wt 143.7 lb

## 2016-01-21 DIAGNOSIS — Z9081 Acquired absence of spleen: Secondary | ICD-10-CM

## 2016-01-21 DIAGNOSIS — D693 Immune thrombocytopenic purpura: Secondary | ICD-10-CM

## 2016-01-21 DIAGNOSIS — R110 Nausea: Secondary | ICD-10-CM | POA: Diagnosis not present

## 2016-01-21 DIAGNOSIS — R5383 Other fatigue: Secondary | ICD-10-CM

## 2016-01-21 DIAGNOSIS — R5381 Other malaise: Secondary | ICD-10-CM | POA: Diagnosis not present

## 2016-01-21 DIAGNOSIS — R630 Anorexia: Secondary | ICD-10-CM

## 2016-01-21 DIAGNOSIS — R531 Weakness: Secondary | ICD-10-CM

## 2016-01-21 DIAGNOSIS — G8929 Other chronic pain: Secondary | ICD-10-CM

## 2016-01-21 DIAGNOSIS — Z79899 Other long term (current) drug therapy: Secondary | ICD-10-CM

## 2016-01-21 DIAGNOSIS — R21 Rash and other nonspecific skin eruption: Secondary | ICD-10-CM

## 2016-01-21 DIAGNOSIS — M549 Dorsalgia, unspecified: Secondary | ICD-10-CM

## 2016-01-21 DIAGNOSIS — E7800 Pure hypercholesterolemia, unspecified: Secondary | ICD-10-CM

## 2016-01-21 DIAGNOSIS — R109 Unspecified abdominal pain: Secondary | ICD-10-CM | POA: Diagnosis not present

## 2016-01-21 DIAGNOSIS — Z86711 Personal history of pulmonary embolism: Secondary | ICD-10-CM

## 2016-01-21 DIAGNOSIS — R197 Diarrhea, unspecified: Secondary | ICD-10-CM | POA: Diagnosis not present

## 2016-01-21 LAB — CBC WITH DIFFERENTIAL/PLATELET
BASOS ABS: 0.1 10*3/uL (ref 0–0.1)
BASOS PCT: 1 %
EOS PCT: 1 %
Eosinophils Absolute: 0.1 10*3/uL (ref 0–0.7)
HCT: 40.2 % (ref 35.0–47.0)
Hemoglobin: 13.9 g/dL (ref 12.0–16.0)
Lymphocytes Relative: 33 %
Lymphs Abs: 2.2 10*3/uL (ref 1.0–3.6)
MCH: 33.5 pg (ref 26.0–34.0)
MCHC: 34.5 g/dL (ref 32.0–36.0)
MCV: 97 fL (ref 80.0–100.0)
MONO ABS: 0.6 10*3/uL (ref 0.2–0.9)
Monocytes Relative: 9 %
Neutro Abs: 3.8 10*3/uL (ref 1.4–6.5)
Neutrophils Relative %: 56 %
PLATELETS: 343 10*3/uL (ref 150–440)
RBC: 4.14 MIL/uL (ref 3.80–5.20)
RDW: 15.1 % — AB (ref 11.5–14.5)
WBC: 6.6 10*3/uL (ref 3.6–11.0)

## 2016-01-21 NOTE — Progress Notes (Signed)
Patient has been holding the Promacta for 1 week with minimal improvement of side effects.  Still has GI issues with slight improvement in appetite and is constipated with last good bowel movement 2 days ago.  Now has a rash on neck that is itchy and burns but not quite sure how long she has had the rash but it has worsened in the last week.

## 2016-01-21 NOTE — Progress Notes (Signed)
[place]  [phone]) [phone]  [phone]) [phone]     Ariana [name] DOB: [birth year]  MR#: [id number]  [id number]  Patient Care Team: Idelle Crouch, MD as PCP - General (Internal Medicine)  CHIEF COMPLAINT:  Chief Complaint  Patient presents with  . ITP    INTERVAL HISTORY: Patient returns to clinic today for evaluation and to restart Promacta after a one week break. Nausea, diarrhea, abdominal discomfort and insomnia have resolved but weakness, fatigue and decreased appetite have not improved. She also has a new red, burning rash on her neck that is causing her much discomfort. She does not complain of easy bleeding or bruising today. She denies any fevers. She has no chest pain or shortness of breath. Patient offers no further specific complaints today.  REVIEW OF SYSTEMS:   Review of Systems  Constitutional: Positive for malaise/fatigue. Negative for fever and weight loss.  Respiratory: Negative.  Negative for shortness of breath.   Cardiovascular: Negative.  Negative for chest pain.  Gastrointestinal: Negative for nausea, abdominal pain, diarrhea, blood in stool and melena.  Genitourinary: Negative.   Musculoskeletal: Negative.   Skin: Positive for rash.  Neurological: Positive for weakness.    As per HPI. Otherwise, a complete review of systems is negatve.  PAST MEDICAL HISTORY: Past Medical History  Diagnosis Date  . Hypercholesteremia   . ITP (idiopathic thrombocytopenic purpura)   . Restless legs   . Anemia   . Depression   . Osteoarthritis   . GERD (gastroesophageal reflux disease)   . Hiatal hernia   . Chronic back pain   . Asthma   . Osteoporosis   . Depression   . Anxiety   . Pulmonary emboli ([place])     PAST SURGICAL HISTORY: Past Surgical History  Procedure Laterality Date  . Splenectomy, partial    . Total abdominal hysterectomy    . Lumbar disc surgery    . Carpal tunnel release    .  Esophagogastroduodenoscopy (egd) with propofol N/A 09/10/2015    Procedure: ESOPHAGOGASTRODUODENOSCOPY (EGD) WITH PROPOFOL;  Surgeon: Lollie Sails, MD;  Location: [facility] ENDOSCOPY;  Service: Endoscopy;  Laterality: N/A;    FAMILY HISTORY Family History  Problem Relation Age of Onset  . Stroke Mother     GYNECOLOGIC HISTORY:  No LMP recorded. Patient is postmenopausal.     ADVANCED DIRECTIVES:    HEALTH MAINTENANCE: Social History  Substance Use Topics  . Smoking status: Never Smoker   . Smokeless tobacco: Never Used  . Alcohol Use: No     Allergies  Allergen Reactions  . Aspirin     Other reaction(s): Distress (finding)  . Diazepam     Other reaction(s): Itching of Skin sneezing  . Morphine     Other reaction(s): Itching of Skin  . Other     Other reaction(s): Unknown seasonal allergies  . Tetanus Toxoids     Other reaction(s): Localized superficial swelling of skin    Current Outpatient Prescriptions  Medication Sig Dispense Refill  . acetaminophen (TYLENOL) 325 MG tablet Take 2 tablets (650 mg total) by mouth every 6 (six) hours as needed for mild pain (or Fever >/= 101). 100 tablet 0  . albuterol (PROVENTIL HFA;VENTOLIN HFA) 108 (90 BASE) MCG/ACT inhaler Inhale 2 puffs into the lungs every 6 (six) hours as needed for wheezing or shortness of breath.    . ALPRAZolam (XANAX) 0.25 MG tablet TAKE 1 TABLET TWICE DAILY AS NEEDED FOR SLEEP    . BUDESONIDE-FORMOTEROL  FUMARATE IN Inhale 2 puffs into the lungs 2 (two) times daily as needed (for shortness of breath and wheezing.).     Marland Kitchen buPROPion (WELLBUTRIN XL) 150 MG 24 hr tablet Take by mouth.    . busPIRone (BUSPAR) 15 MG tablet Take by mouth.    . Calcium Carbonate-Vitamin D (CALCIUM 600+D) 600-400 MG-UNIT per tablet Take 1 tablet by mouth daily.    . citalopram (CELEXA) 40 MG tablet Take 40 mg by mouth daily.    . Cyanocobalamin (RA VITAMIN B-12 TR) 1000 MCG TBCR Take 1,000 mcg by mouth daily.     Marland Kitchen docusate  sodium (COLACE) 100 MG capsule Take 1 capsule (100 mg total) by mouth 2 (two) times daily as needed for mild constipation. 10 capsule 0  . donepezil (ARICEPT) 5 MG tablet Take 5 mg by mouth daily.    Marland Kitchen gabapentin (NEURONTIN) 300 MG capsule 300mg  orally 4 times a day    . HYDROcodone-acetaminophen (NORCO/VICODIN) 5-325 MG per tablet Reported on 10/17/2015    . levothyroxine (SYNTHROID, LEVOTHROID) 50 MCG tablet Take 50 mcg by mouth daily before breakfast. Take 30 to 60 minutes before breakfast.    . memantine (NAMENDA) 5 MG tablet Take by mouth.    . Multiple Vitamins-Minerals (OCUVITE-LUTEIN PO) Take 1 capsule by mouth daily.     . ondansetron (ZOFRAN) 4 MG tablet Take 1 tablet (4 mg total) by mouth every 6 (six) hours as needed for nausea. 20 tablet 0  . oxybutynin (DITROPAN) 5 MG tablet Take by mouth.    . pantoprazole (PROTONIX) 20 MG tablet Take by mouth.    . pravastatin (PRAVACHOL) 20 MG tablet Take 20 mg by mouth daily.    . ranitidine (ZANTAC) 300 MG tablet Take by mouth.    . senna (SENOKOT) 8.6 MG TABS tablet Take 1 tablet (8.6 mg total) by mouth daily as needed for mild constipation. 120 each 0  . traMADol (ULTRAM) 50 MG tablet Take 50 mg by mouth 3 (three) times daily as needed for moderate pain or severe pain. pain    . venlafaxine XR (EFFEXOR-XR) 37.5 MG 24 hr capsule Take 1 capsule by mouth daily.     . vitamin E (E-400) 400 UNIT capsule Take 400 Units by mouth daily.    Marland Kitchen zolpidem (AMBIEN) 5 MG tablet Take 5 mg by mouth at bedtime as needed for sleep.    Marland Kitchen eltrombopag (PROMACTA) 50 MG tablet Take 1 tablet (50 mg total) by mouth daily. Take on an empty stomach 1 hour before a meal or 2 hours after (Patient not taking: Reported on 01/21/2016) 30 tablet 4   No current facility-administered medications for this visit.    OBJECTIVE: BP 136/73 mmHg  Pulse 67  Temp(Src) 97.3 F (36.3 C) (Tympanic)  Resp 18  Wt 143 lb 11.8 oz (65.2 kg)   Body mass index is 23.92 kg/(m^2).    ECOG  FS:1 - Symptomatic but completely ambulatory  General: Well-developed, well-nourished, no acute distress. Eyes: Pink conjunctiva, anicteric sclera. HEENT: Normocephalic, moist mucous membranes, clear oropharnyx. Lungs: Clear to auscultation bilaterally. Heart: Regular rate and rhythm. No rubs, murmurs, or gallops. Musculoskeletal: No edema, cyanosis, or clubbing. Neuro: Alert, answering all questions appropriately. Cranial nerves grossly intact. Skin: Erythremic rash on neck. No petechiae noted. Bruising noted in various stages of healing. Psych: Normal affect.    LAB RESULTS:  Appointment on 01/21/2016  Component Date Value Ref Range Status  . WBC 01/21/2016 6.6  3.6 - 11.0 K/uL  Final  . RBC 01/21/2016 4.14  3.80 - 5.20 MIL/uL Final  . Hemoglobin 01/21/2016 13.9  12.0 - 16.0 g/dL Final  . HCT 01/21/2016 40.2  35.0 - 47.0 % Final  . MCV 01/21/2016 97.0  80.0 - 100.0 fL Final  . MCH 01/21/2016 33.5  26.0 - 34.0 pg Final  . MCHC 01/21/2016 34.5  32.0 - 36.0 g/dL Final  . RDW 01/21/2016 15.1* 11.5 - 14.5 % Final  . Platelets 01/21/2016 343  150 - 440 K/uL Final  . Neutrophils Relative % 01/21/2016 56   Final  . Neutro Abs 01/21/2016 3.8  1.4 - 6.5 K/uL Final  . Lymphocytes Relative 01/21/2016 33   Final  . Lymphs Abs 01/21/2016 2.2  1.0 - 3.6 K/uL Final  . Monocytes Relative 01/21/2016 9   Final  . Monocytes Absolute 01/21/2016 0.6  0.2 - 0.9 K/uL Final  . Eosinophils Relative 01/21/2016 1   Final  . Eosinophils Absolute 01/21/2016 0.1  0 - 0.7 K/uL Final  . Basophils Relative 01/21/2016 1   Final  . Basophils Absolute 01/21/2016 0.1  0 - 0.1 K/uL Final    STUDIES: No results found.  ASSESSMENT:  ITP  PLAN:   1. Chronic refractory ITP: Patient had a poor response to Prednisone, WinRho, and Rituxan. She is status post splenectomy in 2007. She was previously on Nplate and her response was labile at best. She responds to IVIG, although this is not very durable only lasting now  2 weeks. Patient initiated 50 mg Promacta daily with significant improvement of her platelet count to 349. Unfortunately, she had many side effects secondary to the Promacta. Patient held [place] for 1 week with some improvement in side effects. She will hold Promacta for 2 more weeks and return to clinic at that time for repeat laboratory work and reinitiation of Promacta at decreased dose of 25 mg daily.  2. Rash: OTC Cortisone cream. Patient has appointment at dermatology May 5th but she is going to try to get an appointment sooner.  Approximately 30 minutes was spent in discussion of which greater than 50% was consultation.  Patient expressed understanding and was in agreement with this plan. She also understands that She can call clinic at any time with any questions, concerns, or complaints.    Mayra Reel, NP   01/21/2016 10:03 AM  Patient was seen and evaluated independently and I agree with the assessment and plan as dictated above.  Lloyd Huger, MD 01/21/2016 2:09 PM

## 2016-01-22 ENCOUNTER — Inpatient Hospital Stay: Payer: Commercial Managed Care - HMO

## 2016-01-27 DIAGNOSIS — R21 Rash and other nonspecific skin eruption: Secondary | ICD-10-CM | POA: Diagnosis not present

## 2016-02-04 ENCOUNTER — Inpatient Hospital Stay: Payer: Commercial Managed Care - HMO

## 2016-02-04 ENCOUNTER — Inpatient Hospital Stay (HOSPITAL_BASED_OUTPATIENT_CLINIC_OR_DEPARTMENT_OTHER): Payer: Commercial Managed Care - HMO | Admitting: Oncology

## 2016-02-04 VITALS — BP 145/80 | HR 56 | Temp 97.0°F | Resp 16

## 2016-02-04 DIAGNOSIS — R109 Unspecified abdominal pain: Secondary | ICD-10-CM | POA: Diagnosis not present

## 2016-02-04 DIAGNOSIS — E7800 Pure hypercholesterolemia, unspecified: Secondary | ICD-10-CM

## 2016-02-04 DIAGNOSIS — G8929 Other chronic pain: Secondary | ICD-10-CM

## 2016-02-04 DIAGNOSIS — Z79899 Other long term (current) drug therapy: Secondary | ICD-10-CM | POA: Diagnosis not present

## 2016-02-04 DIAGNOSIS — Z9081 Acquired absence of spleen: Secondary | ICD-10-CM

## 2016-02-04 DIAGNOSIS — M549 Dorsalgia, unspecified: Secondary | ICD-10-CM

## 2016-02-04 DIAGNOSIS — D693 Immune thrombocytopenic purpura: Secondary | ICD-10-CM

## 2016-02-04 DIAGNOSIS — R110 Nausea: Secondary | ICD-10-CM | POA: Diagnosis not present

## 2016-02-04 DIAGNOSIS — R21 Rash and other nonspecific skin eruption: Secondary | ICD-10-CM | POA: Diagnosis not present

## 2016-02-04 DIAGNOSIS — R531 Weakness: Secondary | ICD-10-CM | POA: Diagnosis not present

## 2016-02-04 DIAGNOSIS — R5383 Other fatigue: Secondary | ICD-10-CM | POA: Diagnosis not present

## 2016-02-04 DIAGNOSIS — K219 Gastro-esophageal reflux disease without esophagitis: Secondary | ICD-10-CM

## 2016-02-04 DIAGNOSIS — F418 Other specified anxiety disorders: Secondary | ICD-10-CM

## 2016-02-04 DIAGNOSIS — M810 Age-related osteoporosis without current pathological fracture: Secondary | ICD-10-CM

## 2016-02-04 DIAGNOSIS — R197 Diarrhea, unspecified: Secondary | ICD-10-CM | POA: Diagnosis not present

## 2016-02-04 DIAGNOSIS — R5381 Other malaise: Secondary | ICD-10-CM | POA: Diagnosis not present

## 2016-02-04 DIAGNOSIS — M1990 Unspecified osteoarthritis, unspecified site: Secondary | ICD-10-CM

## 2016-02-04 DIAGNOSIS — Z86711 Personal history of pulmonary embolism: Secondary | ICD-10-CM

## 2016-02-04 LAB — CBC WITH DIFFERENTIAL/PLATELET
Basophils Absolute: 0 10*3/uL (ref 0–0.1)
Basophils Relative: 1 %
Eosinophils Absolute: 0.1 10*3/uL (ref 0–0.7)
HEMATOCRIT: 43 % (ref 35.0–47.0)
Hemoglobin: 14.7 g/dL (ref 12.0–16.0)
Lymphs Abs: 2.2 10*3/uL (ref 1.0–3.6)
MCH: 32.7 pg (ref 26.0–34.0)
MCHC: 34.1 g/dL (ref 32.0–36.0)
MCV: 95.9 fL (ref 80.0–100.0)
MONO ABS: 0.5 10*3/uL (ref 0.2–0.9)
NEUTROS ABS: 4.3 10*3/uL (ref 1.4–6.5)
Neutrophils Relative %: 59 %
PLATELETS: 13 10*3/uL — AB (ref 150–440)
RBC: 4.49 MIL/uL (ref 3.80–5.20)
RDW: 14.9 % — AB (ref 11.5–14.5)
WBC: 7.2 10*3/uL (ref 3.6–11.0)

## 2016-02-04 MED ORDER — ELTROMBOPAG OLAMINE 25 MG PO TABS: 25.0000 mg | ORAL_TABLET | Freq: Every day | ORAL | Status: DC

## 2016-02-04 NOTE — Progress Notes (Signed)
Patient previous GI symptoms have improved since holding the Promacta for 2 weeks.

## 2016-02-09 NOTE — Progress Notes (Signed)
[place]  [phone]) [phone]  Fax:(336) [phone]     Ariana [name] DOB: Dec 11, 1932  MR#: [id number]  [id number]  Patient Care Team: Idelle Crouch, MD as PCP - General (Internal Medicine)  CHIEF COMPLAINT:  Chief Complaint  Patient presents with  . ITP    INTERVAL HISTORY:  Patient returns to clinic today for laboratory work and further evaluations. She currently feels well and is back to her baseline. Her nausea, diarrhea, abdominal discomfort and insomnia have resolved. She does not complain of weakness or fatigue today. Her rash has resolved. She does not complain of easy bleeding or bruising today. She denies any fevers. She has no chest pain or shortness of breath. Patient offers no specific complaints today.  REVIEW OF SYSTEMS:   Review of Systems  Constitutional: Negative for fever, weight loss and malaise/fatigue.  Respiratory: Negative.  Negative for shortness of breath.   Cardiovascular: Negative.  Negative for chest pain.  Gastrointestinal: Negative for nausea, abdominal pain, diarrhea, blood in stool and melena.  Genitourinary: Negative.   Musculoskeletal: Negative.   Skin: Negative for rash.  Neurological: Negative for weakness.  Endo/Heme/Allergies: Does not bruise/bleed easily.    As per HPI. Otherwise, a complete review of systems is negatve.  PAST MEDICAL HISTORY: Past Medical History  Diagnosis Date  . Hypercholesteremia   . ITP (idiopathic thrombocytopenic purpura)   . Restless legs   . Anemia   . Depression   . Osteoarthritis   . GERD (gastroesophageal reflux disease)   . Hiatal hernia   . Chronic back pain   . Asthma   . Osteoporosis   . Depression   . Anxiety   . Pulmonary emboli ([place])     PAST SURGICAL HISTORY: Past Surgical History  Procedure Laterality Date  . Splenectomy, partial    . Total abdominal hysterectomy    . Lumbar disc surgery    . Carpal tunnel release    .  Esophagogastroduodenoscopy (egd) with propofol N/A 09/10/2015    Procedure: ESOPHAGOGASTRODUODENOSCOPY (EGD) WITH PROPOFOL;  Surgeon: Lollie Sails, MD;  Location: [facility] ENDOSCOPY;  Service: Endoscopy;  Laterality: N/A;    FAMILY HISTORY Family History  Problem Relation Age of Onset  . Stroke Mother     GYNECOLOGIC HISTORY:  No LMP recorded. Patient is postmenopausal.     ADVANCED DIRECTIVES:    HEALTH MAINTENANCE: Social History  Substance Use Topics  . Smoking status: Never Smoker   . Smokeless tobacco: Never Used  . Alcohol Use: No     Allergies  Allergen Reactions  . Aspirin     Other reaction(s): Distress (finding)  . Diazepam     Other reaction(s): Itching of Skin sneezing  . Morphine     Other reaction(s): Itching of Skin  . Other     Other reaction(s): Unknown seasonal allergies  . Tetanus Toxoids     Other reaction(s): Localized superficial swelling of skin    Current Outpatient Prescriptions  Medication Sig Dispense Refill  . acetaminophen (TYLENOL) 325 MG tablet Take 2 tablets (650 mg total) by mouth every 6 (six) hours as needed for mild pain (or Fever >/= 101). 100 tablet 0  . albuterol (PROVENTIL HFA;VENTOLIN HFA) 108 (90 BASE) MCG/ACT inhaler Inhale 2 puffs into the lungs every 6 (six) hours as needed for wheezing or shortness of breath.    . ALPRAZolam (XANAX) 0.25 MG tablet TAKE 1 TABLET TWICE DAILY AS NEEDED FOR SLEEP    . BUDESONIDE-FORMOTEROL FUMARATE IN Inhale  2 puffs into the lungs 2 (two) times daily as needed (for shortness of breath and wheezing.).     Marland Kitchen buPROPion (WELLBUTRIN XL) 150 MG 24 hr tablet Take by mouth.    . busPIRone (BUSPAR) 15 MG tablet Take by mouth.    . Calcium Carbonate-Vitamin D (CALCIUM 600+D) 600-400 MG-UNIT per tablet Take 1 tablet by mouth daily.    . citalopram (CELEXA) 40 MG tablet Take 40 mg by mouth daily.    . Cyanocobalamin (RA VITAMIN B-12 TR) 1000 MCG TBCR Take 1,000 mcg by mouth daily.     Marland Kitchen docusate  sodium (COLACE) 100 MG capsule Take 1 capsule (100 mg total) by mouth 2 (two) times daily as needed for mild constipation. 10 capsule 0  . donepezil (ARICEPT) 5 MG tablet Take 5 mg by mouth daily.    Marland Kitchen gabapentin (NEURONTIN) 300 MG capsule 300mg  orally 4 times a day    . HYDROcodone-acetaminophen (NORCO/VICODIN) 5-325 MG per tablet Reported on 10/17/2015    . levothyroxine (SYNTHROID, LEVOTHROID) 50 MCG tablet Take 50 mcg by mouth daily before breakfast. Take 30 to 60 minutes before breakfast.    . memantine (NAMENDA) 5 MG tablet Take by mouth.    . Multiple Vitamins-Minerals (OCUVITE-LUTEIN PO) Take 1 capsule by mouth daily.     . ondansetron (ZOFRAN) 4 MG tablet Take 1 tablet (4 mg total) by mouth every 6 (six) hours as needed for nausea. 20 tablet 0  . oxybutynin (DITROPAN) 5 MG tablet Take by mouth.    . pantoprazole (PROTONIX) 20 MG tablet Take by mouth.    . pravastatin (PRAVACHOL) 20 MG tablet Take 20 mg by mouth daily.    . ranitidine (ZANTAC) 300 MG tablet Take by mouth.    . senna (SENOKOT) 8.6 MG TABS tablet Take 1 tablet (8.6 mg total) by mouth daily as needed for mild constipation. 120 each 0  . traMADol (ULTRAM) 50 MG tablet Take 50 mg by mouth 3 (three) times daily as needed for moderate pain or severe pain. pain    . venlafaxine XR (EFFEXOR-XR) 37.5 MG 24 hr capsule Take 1 capsule by mouth daily.     . vitamin E (E-400) 400 UNIT capsule Take 400 Units by mouth daily.    Marland Kitchen zolpidem (AMBIEN) 5 MG tablet Take 5 mg by mouth at bedtime as needed for sleep.    Marland Kitchen eltrombopag (PROMACTA) 25 MG tablet Take 1 tablet (25 mg total) by mouth daily. Take on an empty stomach, 1 hour before a meal or 2 hours after. 30 tablet 4  . eltrombopag (PROMACTA) 50 MG tablet Take 1 tablet (50 mg total) by mouth daily. Take on an empty stomach 1 hour before a meal or 2 hours after (Patient not taking: Reported on 01/21/2016) 30 tablet 4   No current facility-administered medications for this visit.     OBJECTIVE: BP 145/80 mmHg  Pulse 56  Temp(Src) 97 F (36.1 C) (Oral)  Resp 16   There is no weight on file to calculate BMI.    ECOG FS:1 - Symptomatic but completely ambulatory  General: Well-developed, well-nourished, no acute distress. Eyes: Pink conjunctiva, anicteric sclera. HEENT: Normocephalic, moist mucous membranes, clear oropharnyx. Lungs: Clear to auscultation bilaterally. Heart: Regular rate and rhythm. No rubs, murmurs, or gallops. Musculoskeletal: No edema, cyanosis, or clubbing. Neuro: Alert, answering all questions appropriately. Cranial nerves grossly intact. Skin: Erythremic rash on neck. No petechiae noted. Bruising noted in various stages of healing. Psych: Normal affect.  LAB RESULTS:  Appointment on 02/04/2016  Component Date Value Ref Range Status  . WBC 02/04/2016 7.2  3.6 - 11.0 K/uL Final  . RBC 02/04/2016 4.49  3.80 - 5.20 MIL/uL Final  . Hemoglobin 02/04/2016 14.7  12.0 - 16.0 g/dL Final  . HCT 02/04/2016 43.0  35.0 - 47.0 % Final  . MCV 02/04/2016 95.9  80.0 - 100.0 fL Final  . MCH 02/04/2016 32.7  26.0 - 34.0 pg Final  . MCHC 02/04/2016 34.1  32.0 - 36.0 g/dL Final  . RDW 02/04/2016 14.9* 11.5 - 14.5 % Final  . Platelets 02/04/2016 13* 150 - 440 K/uL Final   Comment: RESULT REPEATED AND VERIFIED with citrate PLATELET COUNT CONFIRMED BY SMEAR CRITICAL RESULT CALLED TO, READ BACK BY AND VERIFIED WITH: [facility] TO [place] 1209 02/04/16   . Neutrophils Relative % 02/04/2016 59%   Final  . Neutro Abs 02/04/2016 4.3  1.4 - 6.5 K/uL Final  . Lymphocytes Relative 02/04/2016 31%   Final  . Lymphs Abs 02/04/2016 2.2  1.0 - 3.6 K/uL Final  . Monocytes Relative 02/04/2016 8%   Final  . Monocytes Absolute 02/04/2016 0.5  0.2 - 0.9 K/uL Final  . Eosinophils Relative 02/04/2016 1%   Final  . Eosinophils Absolute 02/04/2016 0.1  0 - 0.7 K/uL Final  . Basophils Relative 02/04/2016 1%   Final  . Basophils Absolute 02/04/2016 0.0  0 - 0.1 K/uL Final     STUDIES: No results found.  ASSESSMENT:  ITP  PLAN:   1. Chronic refractory ITP: Patient had a poor response to Prednisone, WinRho, and Rituxan. She is status post splenectomy in 2007. She was previously on Nplate and her response was labile at best. She responds to IVIG, although this is not very durable only lasting 2 weeks. Patient initiated 50 mg Promacta daily with significant improvement of her platelet count to 349. Unfortunately, she had many side effects secondary to the Promacta. Patient has now held [place] for 3 weeks with resolution of her side effects, but her platelet count has significantly decreased. Proceed with Promacta at decreased dose of 25 mg daily.  Return to clinic in 1 week for laboratory work and then in 2 weeks for laboratory work and further evaluation. 2. Rash: Improving. OTC Cortisone cream. Patient has appointment with Dermatology on May 5th.  Approximately 30 minutes was spent in discussion of which greater than 50% was consultation.  Patient expressed understanding and was in agreement with this plan. She also understands that She can call clinic at any time with any questions, concerns, or complaints.    Lloyd Huger, MD   02/09/2016 9:26 AM

## 2016-02-11 ENCOUNTER — Inpatient Hospital Stay: Payer: Commercial Managed Care - HMO | Attending: Internal Medicine

## 2016-02-11 DIAGNOSIS — D693 Immune thrombocytopenic purpura: Secondary | ICD-10-CM | POA: Insufficient documentation

## 2016-02-11 DIAGNOSIS — Z79899 Other long term (current) drug therapy: Secondary | ICD-10-CM | POA: Insufficient documentation

## 2016-02-11 DIAGNOSIS — F418 Other specified anxiety disorders: Secondary | ICD-10-CM | POA: Diagnosis not present

## 2016-02-11 DIAGNOSIS — Z9081 Acquired absence of spleen: Secondary | ICD-10-CM | POA: Insufficient documentation

## 2016-02-11 DIAGNOSIS — K219 Gastro-esophageal reflux disease without esophagitis: Secondary | ICD-10-CM | POA: Insufficient documentation

## 2016-02-11 DIAGNOSIS — M1990 Unspecified osteoarthritis, unspecified site: Secondary | ICD-10-CM | POA: Diagnosis not present

## 2016-02-11 DIAGNOSIS — Z780 Asymptomatic menopausal state: Secondary | ICD-10-CM | POA: Insufficient documentation

## 2016-02-11 DIAGNOSIS — E7800 Pure hypercholesterolemia, unspecified: Secondary | ICD-10-CM | POA: Diagnosis not present

## 2016-02-11 LAB — CBC WITH DIFFERENTIAL/PLATELET
Basophils Absolute: 0.1 10*3/uL (ref 0–0.1)
Basophils Relative: 1 %
Eosinophils Absolute: 0.1 10*3/uL (ref 0–0.7)
Eosinophils Relative: 1 %
HCT: 40.8 % (ref 35.0–47.0)
HEMOGLOBIN: 13.8 g/dL (ref 12.0–16.0)
LYMPHS ABS: 2.3 10*3/uL (ref 1.0–3.6)
MCH: 32.7 pg (ref 26.0–34.0)
MCHC: 33.9 g/dL (ref 32.0–36.0)
MCV: 96.5 fL (ref 80.0–100.0)
Monocytes Absolute: 0.6 10*3/uL (ref 0.2–0.9)
Neutro Abs: 5.3 10*3/uL (ref 1.4–6.5)
Platelets: 36 10*3/uL — ABNORMAL LOW (ref 150–440)
RBC: 4.23 MIL/uL (ref 3.80–5.20)
RDW: 14.5 % (ref 11.5–14.5)
WBC: 8.3 10*3/uL (ref 3.6–11.0)

## 2016-02-14 DIAGNOSIS — L239 Allergic contact dermatitis, unspecified cause: Secondary | ICD-10-CM | POA: Diagnosis not present

## 2016-02-18 ENCOUNTER — Inpatient Hospital Stay (HOSPITAL_BASED_OUTPATIENT_CLINIC_OR_DEPARTMENT_OTHER): Payer: Commercial Managed Care - HMO | Admitting: Oncology

## 2016-02-18 ENCOUNTER — Inpatient Hospital Stay: Payer: Commercial Managed Care - HMO

## 2016-02-18 VITALS — BP 151/84 | Temp 94.3°F | Wt 148.1 lb

## 2016-02-18 DIAGNOSIS — F418 Other specified anxiety disorders: Secondary | ICD-10-CM | POA: Diagnosis not present

## 2016-02-18 DIAGNOSIS — Z79899 Other long term (current) drug therapy: Secondary | ICD-10-CM | POA: Diagnosis not present

## 2016-02-18 DIAGNOSIS — Z9081 Acquired absence of spleen: Secondary | ICD-10-CM

## 2016-02-18 DIAGNOSIS — K219 Gastro-esophageal reflux disease without esophagitis: Secondary | ICD-10-CM | POA: Diagnosis not present

## 2016-02-18 DIAGNOSIS — Z780 Asymptomatic menopausal state: Secondary | ICD-10-CM

## 2016-02-18 DIAGNOSIS — D693 Immune thrombocytopenic purpura: Secondary | ICD-10-CM | POA: Diagnosis not present

## 2016-02-18 DIAGNOSIS — E7800 Pure hypercholesterolemia, unspecified: Secondary | ICD-10-CM

## 2016-02-18 DIAGNOSIS — M1990 Unspecified osteoarthritis, unspecified site: Secondary | ICD-10-CM | POA: Diagnosis not present

## 2016-02-18 LAB — CBC WITH DIFFERENTIAL/PLATELET
Basophils Absolute: 0.1 10*3/uL (ref 0–0.1)
Basophils Relative: 1 %
Eosinophils Absolute: 0.1 10*3/uL (ref 0–0.7)
Eosinophils Relative: 1 %
HCT: 40.8 % (ref 35.0–47.0)
Hemoglobin: 13.7 g/dL (ref 12.0–16.0)
LYMPHS ABS: 1.9 10*3/uL (ref 1.0–3.6)
MCH: 32.4 pg (ref 26.0–34.0)
MCHC: 33.7 g/dL (ref 32.0–36.0)
MCV: 96.2 fL (ref 80.0–100.0)
MONO ABS: 0.6 10*3/uL (ref 0.2–0.9)
Monocytes Relative: 8 %
NEUTROS ABS: 4.4 10*3/uL (ref 1.4–6.5)
Neutrophils Relative %: 63 %
Platelets: 59 10*3/uL — ABNORMAL LOW (ref 150–440)
RBC: 4.24 MIL/uL (ref 3.80–5.20)
RDW: 14.2 % (ref 11.5–14.5)
WBC: 7 10*3/uL (ref 3.6–11.0)

## 2016-02-18 NOTE — Progress Notes (Signed)
Patient is tolerating the Promacta 25mg  QD.

## 2016-02-18 NOTE — Progress Notes (Signed)
[place]  [phone]  Fax:(336) [phone]     Ariana [name] DOB: 12-11-1932  MR#: [id number]  [id number]  Patient Care Team: Idelle Crouch, MD as PCP - General (Internal Medicine)  CHIEF COMPLAINT:  Chief Complaint  Patient presents with  . ITP    INTERVAL HISTORY:  Patient returns to clinic today for laboratory work and further evaluation. She currently feels well and is back to her baseline. Her nausea, diarrhea, abdominal discomfort and insomnia have resolved. She still bruises easily and she still get tired easily otherwise doing well. Tolerating 25 mg Promacta daily well without side effects.  Her rash has resolved. She denies any fevers. She has no chest pain or shortness of breath. Patient offers no specific complaints today.  REVIEW OF SYSTEMS:   Review of Systems  Constitutional: Negative for fever, weight loss and malaise/fatigue.  Respiratory: Negative.  Negative for shortness of breath.   Cardiovascular: Negative.  Negative for chest pain.  Gastrointestinal: Negative for nausea, abdominal pain, diarrhea, blood in stool and melena.  Genitourinary: Negative.   Musculoskeletal: Negative.   Skin: Negative for rash.  Neurological: Negative for weakness.  Endo/Heme/Allergies: Bruises/bleeds easily.    As per HPI. Otherwise, a complete review of systems is negatve.  PAST MEDICAL HISTORY: Past Medical History  Diagnosis Date  . Hypercholesteremia   . ITP (idiopathic thrombocytopenic purpura)   . Restless legs   . Anemia   . Depression   . Osteoarthritis   . GERD (gastroesophageal reflux disease)   . Hiatal hernia   . Chronic back pain   . Asthma   . Osteoporosis   . Depression   . Anxiety   . Pulmonary emboli ([place])     PAST SURGICAL HISTORY: Past Surgical History  Procedure Laterality Date  . Splenectomy, partial    . Total abdominal hysterectomy    . Lumbar disc surgery    . Carpal tunnel release    .  Esophagogastroduodenoscopy (egd) with propofol N/A 09/10/2015    Procedure: ESOPHAGOGASTRODUODENOSCOPY (EGD) WITH PROPOFOL;  Surgeon: Lollie Sails, MD;  Location: [facility] ENDOSCOPY;  Service: Endoscopy;  Laterality: N/A;    FAMILY HISTORY Family History  Problem Relation Age of Onset  . Stroke Mother     GYNECOLOGIC HISTORY:  No LMP recorded. Patient is postmenopausal.     ADVANCED DIRECTIVES:    HEALTH MAINTENANCE: Social History  Substance Use Topics  . Smoking status: Never Smoker   . Smokeless tobacco: Never Used  . Alcohol Use: No     Allergies  Allergen Reactions  . Aspirin     Other reaction(s): Distress (finding)  . Diazepam     Other reaction(s): Itching of Skin sneezing  . Morphine     Other reaction(s): Itching of Skin  . Other     Other reaction(s): Unknown seasonal allergies  . Tetanus Toxoids     Other reaction(s): Localized superficial swelling of skin    Current Outpatient Prescriptions  Medication Sig Dispense Refill  . acetaminophen (TYLENOL) 325 MG tablet Take 2 tablets (650 mg total) by mouth every 6 (six) hours as needed for mild pain (or Fever >/= 101). 100 tablet 0  . albuterol (PROVENTIL HFA;VENTOLIN HFA) 108 (90 BASE) MCG/ACT inhaler Inhale 2 puffs into the lungs every 6 (six) hours as needed for wheezing or shortness of breath.    . ALPRAZolam (XANAX) 0.25 MG tablet TAKE 1 TABLET TWICE DAILY AS NEEDED FOR SLEEP    . BUDESONIDE-FORMOTEROL FUMARATE  IN Inhale 2 puffs into the lungs 2 (two) times daily as needed (for shortness of breath and wheezing.).     Marland Kitchen buPROPion (WELLBUTRIN XL) 150 MG 24 hr tablet Take by mouth.    . busPIRone (BUSPAR) 15 MG tablet Take by mouth.    . Calcium Carbonate-Vitamin D (CALCIUM 600+D) 600-400 MG-UNIT per tablet Take 1 tablet by mouth daily.    . citalopram (CELEXA) 40 MG tablet Take 40 mg by mouth daily.    . Cyanocobalamin (RA VITAMIN B-12 TR) 1000 MCG TBCR Take 1,000 mcg by mouth daily.     Marland Kitchen docusate  sodium (COLACE) 100 MG capsule Take 1 capsule (100 mg total) by mouth 2 (two) times daily as needed for mild constipation. 10 capsule 0  . donepezil (ARICEPT) 5 MG tablet Take 5 mg by mouth daily.    Marland Kitchen eltrombopag (PROMACTA) 25 MG tablet Take 1 tablet (25 mg total) by mouth daily. Take on an empty stomach, 1 hour before a meal or 2 hours after. 30 tablet 4  . gabapentin (NEURONTIN) 300 MG capsule 300mg  orally 4 times a day    . HYDROcodone-acetaminophen (NORCO/VICODIN) 5-325 MG per tablet Reported on 10/17/2015    . levothyroxine (SYNTHROID, LEVOTHROID) 50 MCG tablet Take 50 mcg by mouth daily before breakfast. Take 30 to 60 minutes before breakfast.    . memantine (NAMENDA) 5 MG tablet Take by mouth.    . Multiple Vitamins-Minerals (OCUVITE-LUTEIN PO) Take 1 capsule by mouth daily.     . ondansetron (ZOFRAN) 4 MG tablet Take 1 tablet (4 mg total) by mouth every 6 (six) hours as needed for nausea. 20 tablet 0  . oxybutynin (DITROPAN) 5 MG tablet Take by mouth.    . pantoprazole (PROTONIX) 20 MG tablet Take by mouth.    . pravastatin (PRAVACHOL) 20 MG tablet Take 20 mg by mouth daily.    . ranitidine (ZANTAC) 300 MG tablet Take by mouth.    . senna (SENOKOT) 8.6 MG TABS tablet Take 1 tablet (8.6 mg total) by mouth daily as needed for mild constipation. 120 each 0  . traMADol (ULTRAM) 50 MG tablet Take 50 mg by mouth 3 (three) times daily as needed for moderate pain or severe pain. pain    . venlafaxine XR (EFFEXOR-XR) 37.5 MG 24 hr capsule Take 1 capsule by mouth daily.     . vitamin E (E-400) 400 UNIT capsule Take 400 Units by mouth daily.    Marland Kitchen zolpidem (AMBIEN) 5 MG tablet Take 5 mg by mouth at bedtime as needed for sleep.     No current facility-administered medications for this visit.    OBJECTIVE: BP 151/84 mmHg  Temp(Src) 94.3 F (34.6 C)  Wt 148 lb 2.4 oz (67.2 kg)   Body mass index is 24.65 kg/(m^2).    ECOG FS:1 - Symptomatic but completely ambulatory  General: Well-developed,  well-nourished, no acute distress. Eyes: Pink conjunctiva, anicteric sclera. HEENT: Normocephalic, moist mucous membranes, clear oropharnyx. Lungs: Clear to auscultation bilaterally. Heart: Regular rate and rhythm. No rubs, murmurs, or gallops. Musculoskeletal: No edema, cyanosis, or clubbing. Neuro: Alert, answering all questions appropriately. Cranial nerves grossly intact. Skin: No petechiae noted. Bruising noted in various stages of healing. Psych: Normal affect.    LAB RESULTS:  Appointment on 02/18/2016  Component Date Value Ref Range Status  . WBC 02/18/2016 7.0  3.6 - 11.0 K/uL Final  . RBC 02/18/2016 4.24  3.80 - 5.20 MIL/uL Final  . Hemoglobin 02/18/2016 13.7  12.0 - 16.0 g/dL Final  . HCT 02/18/2016 40.8  35.0 - 47.0 % Final  . MCV 02/18/2016 96.2  80.0 - 100.0 fL Final  . MCH 02/18/2016 32.4  26.0 - 34.0 pg Final  . MCHC 02/18/2016 33.7  32.0 - 36.0 g/dL Final  . RDW 02/18/2016 14.2  11.5 - 14.5 % Final  . Platelets 02/18/2016 59* 150 - 440 K/uL Final   Comment: PLATELET COUNT CONFIRMED BY SMEAR PLATELET COUNT CONFIRMED WITH CITRATE TUBE   . Neutrophils Relative % 02/18/2016 63%   Final  . Neutro Abs 02/18/2016 4.4  1.4 - 6.5 K/uL Final  . Lymphocytes Relative 02/18/2016 27%   Final  . Lymphs Abs 02/18/2016 1.9  1.0 - 3.6 K/uL Final  . Monocytes Relative 02/18/2016 8%   Final  . Monocytes Absolute 02/18/2016 0.6  0.2 - 0.9 K/uL Final  . Eosinophils Relative 02/18/2016 1%   Final  . Eosinophils Absolute 02/18/2016 0.1  0 - 0.7 K/uL Final  . Basophils Relative 02/18/2016 1%   Final  . Basophils Absolute 02/18/2016 0.1  0 - 0.1 K/uL Final    STUDIES: No results found.  ASSESSMENT:  ITP  PLAN:   1. Chronic refractory ITP: Patient had a poor response to Prednisone, WinRho, and Rituxan. She is status post splenectomy in 2007. She was previously on Nplate and her response was labile at best. She responds to IVIG, although this is not very durable only lasting 2  weeks. Patient initiated 50 mg Promacta daily with significant improvement of her platelet count to 349. Unfortunately, she had many side effects secondary to the Promacta. Patient has been taking Promacta 25 mg for 2 weeks now with steady improvement in platelet count. Continue with Promacta at dose of 25 mg daily.  Once platelet count reaches 200,000 will reduce Promacta to 12.5 mg daily. Return to clinic in 2  week for laboratory work and then in 4 weeks for laboratory work and further evaluation.  Approximately 30 minutes was spent in discussion of which greater than 50% was consultation.  Patient expressed understanding and was in agreement with this plan. She also understands that She can call clinic at any time with any questions, concerns, or complaints.    Ariana Reel, NP   02/18/2016 2:32 PM  Patient was seen and evaluated independently and I agree with the assessment and plan as dictated above.  Lloyd Huger, MD 02/24/2016 10:40 AM

## 2016-02-25 DIAGNOSIS — H1131 Conjunctival hemorrhage, right eye: Secondary | ICD-10-CM | POA: Diagnosis not present

## 2016-02-25 DIAGNOSIS — S8012XA Contusion of left lower leg, initial encounter: Secondary | ICD-10-CM | POA: Diagnosis not present

## 2016-02-25 DIAGNOSIS — L089 Local infection of the skin and subcutaneous tissue, unspecified: Secondary | ICD-10-CM | POA: Diagnosis not present

## 2016-02-25 DIAGNOSIS — H353131 Nonexudative age-related macular degeneration, bilateral, early dry stage: Secondary | ICD-10-CM | POA: Diagnosis not present

## 2016-02-27 ENCOUNTER — Inpatient Hospital Stay: Payer: Commercial Managed Care - HMO

## 2016-02-27 ENCOUNTER — Other Ambulatory Visit: Payer: Self-pay | Admitting: *Deleted

## 2016-02-27 ENCOUNTER — Other Ambulatory Visit: Payer: Self-pay | Admitting: Oncology

## 2016-02-27 DIAGNOSIS — K219 Gastro-esophageal reflux disease without esophagitis: Secondary | ICD-10-CM | POA: Diagnosis not present

## 2016-02-27 DIAGNOSIS — Z79899 Other long term (current) drug therapy: Secondary | ICD-10-CM | POA: Diagnosis not present

## 2016-02-27 DIAGNOSIS — H1133 Conjunctival hemorrhage, bilateral: Secondary | ICD-10-CM | POA: Diagnosis not present

## 2016-02-27 DIAGNOSIS — Z780 Asymptomatic menopausal state: Secondary | ICD-10-CM | POA: Diagnosis not present

## 2016-02-27 DIAGNOSIS — D473 Essential (hemorrhagic) thrombocythemia: Secondary | ICD-10-CM

## 2016-02-27 DIAGNOSIS — E7800 Pure hypercholesterolemia, unspecified: Secondary | ICD-10-CM | POA: Diagnosis not present

## 2016-02-27 DIAGNOSIS — D693 Immune thrombocytopenic purpura: Secondary | ICD-10-CM | POA: Diagnosis not present

## 2016-02-27 DIAGNOSIS — F418 Other specified anxiety disorders: Secondary | ICD-10-CM | POA: Diagnosis not present

## 2016-02-27 DIAGNOSIS — Z9081 Acquired absence of spleen: Secondary | ICD-10-CM | POA: Diagnosis not present

## 2016-02-27 DIAGNOSIS — M1990 Unspecified osteoarthritis, unspecified site: Secondary | ICD-10-CM | POA: Diagnosis not present

## 2016-02-27 LAB — CBC WITH DIFFERENTIAL/PLATELET
Basophils Absolute: 0.1 10*3/uL (ref 0–0.1)
EOS ABS: 0.2 10*3/uL (ref 0–0.7)
Eosinophils Relative: 3 %
HEMATOCRIT: 40.7 % (ref 35.0–47.0)
HEMOGLOBIN: 13.6 g/dL (ref 12.0–16.0)
LYMPHS ABS: 2.5 10*3/uL (ref 1.0–3.6)
Lymphocytes Relative: 36 %
MCH: 31.9 pg (ref 26.0–34.0)
MCHC: 33.4 g/dL (ref 32.0–36.0)
MCV: 95.4 fL (ref 80.0–100.0)
Monocytes Absolute: 0.7 10*3/uL (ref 0.2–0.9)
Neutro Abs: 3.6 10*3/uL (ref 1.4–6.5)
Neutrophils Relative %: 50 %
Platelets: 52 10*3/uL — ABNORMAL LOW (ref 150–440)
RBC: 4.27 MIL/uL (ref 3.80–5.20)
RDW: 13.9 % (ref 11.5–14.5)
WBC: 7 10*3/uL (ref 3.6–11.0)

## 2016-02-27 LAB — PROTIME-INR
INR: 0.98
PROTHROMBIN TIME: 13.2 s (ref 11.4–15.0)

## 2016-02-27 LAB — APTT: aPTT: 28 seconds (ref 24–36)

## 2016-02-28 ENCOUNTER — Encounter: Payer: Self-pay | Admitting: *Deleted

## 2016-02-28 DIAGNOSIS — H1133 Conjunctival hemorrhage, bilateral: Secondary | ICD-10-CM | POA: Diagnosis not present

## 2016-02-28 NOTE — Progress Notes (Signed)
Labs drawn yesterday faxed to Dr. Lewayne Bunting office.

## 2016-03-02 DIAGNOSIS — I10 Essential (primary) hypertension: Secondary | ICD-10-CM | POA: Diagnosis not present

## 2016-03-02 DIAGNOSIS — Z79899 Other long term (current) drug therapy: Secondary | ICD-10-CM | POA: Diagnosis not present

## 2016-03-02 DIAGNOSIS — E039 Hypothyroidism, unspecified: Secondary | ICD-10-CM | POA: Diagnosis not present

## 2016-03-02 DIAGNOSIS — F0280 Dementia in other diseases classified elsewhere without behavioral disturbance: Secondary | ICD-10-CM | POA: Diagnosis not present

## 2016-03-02 DIAGNOSIS — M5136 Other intervertebral disc degeneration, lumbar region: Secondary | ICD-10-CM | POA: Diagnosis not present

## 2016-03-02 DIAGNOSIS — D473 Essential (hemorrhagic) thrombocythemia: Secondary | ICD-10-CM | POA: Diagnosis not present

## 2016-03-02 DIAGNOSIS — E7800 Pure hypercholesterolemia, unspecified: Secondary | ICD-10-CM | POA: Diagnosis not present

## 2016-03-02 DIAGNOSIS — G301 Alzheimer's disease with late onset: Secondary | ICD-10-CM | POA: Diagnosis not present

## 2016-03-05 ENCOUNTER — Ambulatory Visit: Payer: Commercial Managed Care - HMO | Admitting: Oncology

## 2016-03-05 ENCOUNTER — Other Ambulatory Visit: Payer: Commercial Managed Care - HMO

## 2016-03-05 ENCOUNTER — Inpatient Hospital Stay: Payer: Commercial Managed Care - HMO

## 2016-03-16 DIAGNOSIS — M5412 Radiculopathy, cervical region: Secondary | ICD-10-CM | POA: Diagnosis not present

## 2016-03-16 DIAGNOSIS — M5416 Radiculopathy, lumbar region: Secondary | ICD-10-CM | POA: Diagnosis not present

## 2016-03-16 DIAGNOSIS — M62830 Muscle spasm of back: Secondary | ICD-10-CM | POA: Diagnosis not present

## 2016-03-16 DIAGNOSIS — M5136 Other intervertebral disc degeneration, lumbar region: Secondary | ICD-10-CM | POA: Diagnosis not present

## 2016-03-19 ENCOUNTER — Inpatient Hospital Stay: Payer: Commercial Managed Care - HMO | Attending: Oncology

## 2016-03-19 ENCOUNTER — Inpatient Hospital Stay (HOSPITAL_BASED_OUTPATIENT_CLINIC_OR_DEPARTMENT_OTHER): Payer: Commercial Managed Care - HMO | Admitting: Oncology

## 2016-03-19 VITALS — BP 140/80 | HR 63 | Temp 98.0°F | Resp 18 | Wt 143.3 lb

## 2016-03-19 DIAGNOSIS — R630 Anorexia: Secondary | ICD-10-CM

## 2016-03-19 DIAGNOSIS — E7800 Pure hypercholesterolemia, unspecified: Secondary | ICD-10-CM | POA: Insufficient documentation

## 2016-03-19 DIAGNOSIS — M549 Dorsalgia, unspecified: Secondary | ICD-10-CM | POA: Diagnosis not present

## 2016-03-19 DIAGNOSIS — R5381 Other malaise: Secondary | ICD-10-CM

## 2016-03-19 DIAGNOSIS — G8929 Other chronic pain: Secondary | ICD-10-CM

## 2016-03-19 DIAGNOSIS — Z79899 Other long term (current) drug therapy: Secondary | ICD-10-CM

## 2016-03-19 DIAGNOSIS — M1990 Unspecified osteoarthritis, unspecified site: Secondary | ICD-10-CM | POA: Insufficient documentation

## 2016-03-19 DIAGNOSIS — Z86711 Personal history of pulmonary embolism: Secondary | ICD-10-CM

## 2016-03-19 DIAGNOSIS — R5383 Other fatigue: Secondary | ICD-10-CM | POA: Diagnosis not present

## 2016-03-19 DIAGNOSIS — R531 Weakness: Secondary | ICD-10-CM | POA: Insufficient documentation

## 2016-03-19 DIAGNOSIS — G2581 Restless legs syndrome: Secondary | ICD-10-CM | POA: Insufficient documentation

## 2016-03-19 DIAGNOSIS — D693 Immune thrombocytopenic purpura: Secondary | ICD-10-CM

## 2016-03-19 DIAGNOSIS — R110 Nausea: Secondary | ICD-10-CM | POA: Insufficient documentation

## 2016-03-19 DIAGNOSIS — K219 Gastro-esophageal reflux disease without esophagitis: Secondary | ICD-10-CM | POA: Insufficient documentation

## 2016-03-19 DIAGNOSIS — R197 Diarrhea, unspecified: Secondary | ICD-10-CM

## 2016-03-19 DIAGNOSIS — Z9081 Acquired absence of spleen: Secondary | ICD-10-CM | POA: Insufficient documentation

## 2016-03-19 DIAGNOSIS — F418 Other specified anxiety disorders: Secondary | ICD-10-CM | POA: Insufficient documentation

## 2016-03-19 LAB — CBC WITH DIFFERENTIAL/PLATELET
Basophils Absolute: 0.1 10*3/uL (ref 0–0.1)
Basophils Relative: 1 %
Eosinophils Absolute: 0.1 10*3/uL (ref 0–0.7)
HEMATOCRIT: 42.1 % (ref 35.0–47.0)
HEMOGLOBIN: 14.2 g/dL (ref 12.0–16.0)
LYMPHS ABS: 1.8 10*3/uL (ref 1.0–3.6)
MCH: 31.4 pg (ref 26.0–34.0)
MCHC: 33.7 g/dL (ref 32.0–36.0)
MCV: 93.3 fL (ref 80.0–100.0)
Monocytes Absolute: 0.7 10*3/uL (ref 0.2–0.9)
Neutro Abs: 5.3 10*3/uL (ref 1.4–6.5)
Platelets: 75 10*3/uL — ABNORMAL LOW (ref 150–400)
RBC: 4.51 MIL/uL (ref 3.80–5.20)
RDW: 13.7 % (ref 11.5–14.5)
WBC: 7.9 10*3/uL (ref 3.6–11.0)

## 2016-03-19 NOTE — Progress Notes (Signed)
Patient has an increase in her weakness also had diarrhea last week for about 3-4 days.  Despite her weakness and advice from her som patient patient still does yard work which includes trimming the trees.  The last time she was trimming the trees she punctured her leg and now has a big area that is dark in color.  Had a fall a couple of weeks ago and is having right side rib pain did not have any imaging.  Appetite has decreased and only ate 2 eggs all day long yesterday.

## 2016-03-24 ENCOUNTER — Telehealth: Payer: Self-pay | Admitting: *Deleted

## 2016-03-24 NOTE — Telephone Encounter (Signed)
Per Dr Grayland Ormond stop Promacta and come in to be seen next week.  I called patient and instructed her on Dr Gary Fleet orders, She stated stop Promacta, All right! And agreed to appt on 6/21 @ 11 AM

## 2016-03-24 NOTE — Telephone Encounter (Signed)
-----   Message from Cephus Richer sent at 03/24/2016  3:55 PM EDT ----- Contact: [phone] Per pt not feeling good at all.. Can't eat , weak would like to know is there anything we can do. She said she thinks it the meds she is taking that's making her feel so bad.

## 2016-03-24 NOTE — Progress Notes (Signed)
[name]  Telephone:(336) [phone]  Fax:(336) [phone]     Doninique [name] DOB: 1932-12-26  MR#: [id number]  [id number]  Patient Care Team: Idelle Crouch, MD as PCP - General (Internal Medicine)  CHIEF COMPLAINT:  Chief Complaint  Patient presents with  . Chronic refractory ITP    INTERVAL HISTORY:  Patient returns to clinic today for laboratory work and further evaluation. She had an episode of diarrhea last week but otherwise has felt well. She has increased weakness and fatigue, but her son reports she has been doing extensive yardwork. She has a decreased appetite over the last 1-2 days. She still bruises easily. She is  tolerating 25 mg Promacta daily well.  Her rash has resolved. She denies any fevers. She has no chest pain or shortness of breath. Patient offers no further specific complaints today.  REVIEW OF SYSTEMS:   Review of Systems  Constitutional: Positive for malaise/fatigue. Negative for fever and weight loss.  Respiratory: Negative.  Negative for shortness of breath.   Cardiovascular: Negative.  Negative for chest pain.  Gastrointestinal: Positive for diarrhea. Negative for nausea, abdominal pain, blood in stool and melena.  Genitourinary: Negative.   Musculoskeletal: Negative.   Skin: Negative for rash.  Neurological: Positive for weakness.  Endo/Heme/Allergies: Bruises/bleeds easily.  Psychiatric/Behavioral: Negative.     As per HPI. Otherwise, a complete review of systems is negatve.  PAST MEDICAL HISTORY: Past Medical History  Diagnosis Date  . Hypercholesteremia   . ITP (idiopathic thrombocytopenic purpura)   . Restless legs   . Anemia   . Depression   . Osteoarthritis   . GERD (gastroesophageal reflux disease)   . Hiatal hernia   . Chronic back pain   . Asthma   . Osteoporosis   . Depression   . Anxiety   . Pulmonary emboli ([place])     PAST SURGICAL HISTORY: Past Surgical History  Procedure Laterality  Date  . Splenectomy, partial    . Total abdominal hysterectomy    . Lumbar disc surgery    . Carpal tunnel release    . Esophagogastroduodenoscopy (egd) with propofol N/A 09/10/2015    Procedure: ESOPHAGOGASTRODUODENOSCOPY (EGD) WITH PROPOFOL;  Surgeon: Lollie Sails, MD;  Location: [facility] ENDOSCOPY;  Service: Endoscopy;  Laterality: N/A;    FAMILY HISTORY Family History  Problem Relation Age of Onset  . Stroke Mother     GYNECOLOGIC HISTORY:  No LMP recorded. Patient is postmenopausal.     ADVANCED DIRECTIVES:    HEALTH MAINTENANCE: Social History  Substance Use Topics  . Smoking status: Never Smoker   . Smokeless tobacco: Never Used  . Alcohol Use: No     Allergies  Allergen Reactions  . Aspirin     Other reaction(s): Distress (finding)  . Diazepam     Other reaction(s): Itching of Skin sneezing  . Morphine     Other reaction(s): Itching of Skin  . Other     Other reaction(s): Unknown seasonal allergies  . Tetanus Toxoids     Other reaction(s): Localized superficial swelling of skin    Current Outpatient Prescriptions  Medication Sig Dispense Refill  . acetaminophen (TYLENOL) 325 MG tablet Take 2 tablets (650 mg total) by mouth every 6 (six) hours as needed for mild pain (or Fever >/= 101). 100 tablet 0  . albuterol (PROVENTIL HFA;VENTOLIN HFA) 108 (90 BASE) MCG/ACT inhaler Inhale 2 puffs into the lungs every 6 (six) hours as needed for wheezing or shortness of breath.    Marland Kitchen  ALPRAZolam (XANAX) 0.25 MG tablet TAKE 1 TABLET TWICE DAILY AS NEEDED FOR SLEEP    . BUDESONIDE-FORMOTEROL FUMARATE IN Inhale 2 puffs into the lungs 2 (two) times daily as needed (for shortness of breath and wheezing.).     Marland Kitchen buPROPion (WELLBUTRIN XL) 150 MG 24 hr tablet Take by mouth.    . busPIRone (BUSPAR) 15 MG tablet Take by mouth.    . Calcium Carbonate-Vitamin D (CALCIUM 600+D) 600-400 MG-UNIT per tablet Take 1 tablet by mouth daily.    . citalopram (CELEXA) 40 MG tablet Take 40 mg  by mouth daily.    . Cyanocobalamin (RA VITAMIN B-12 TR) 1000 MCG TBCR Take 1,000 mcg by mouth daily.     Marland Kitchen docusate sodium (COLACE) 100 MG capsule Take 1 capsule (100 mg total) by mouth 2 (two) times daily as needed for mild constipation. 10 capsule 0  . donepezil (ARICEPT) 5 MG tablet Take 5 mg by mouth daily.    Marland Kitchen eltrombopag (PROMACTA) 25 MG tablet Take 1 tablet (25 mg total) by mouth daily. Take on an empty stomach, 1 hour before a meal or 2 hours after. 30 tablet 4  . gabapentin (NEURONTIN) 300 MG capsule 300mg  orally 4 times a day    . HYDROcodone-acetaminophen (NORCO/VICODIN) 5-325 MG per tablet Reported on 10/17/2015    . levothyroxine (SYNTHROID, LEVOTHROID) 50 MCG tablet Take 50 mcg by mouth daily before breakfast. Take 30 to 60 minutes before breakfast.    . memantine (NAMENDA) 5 MG tablet Take by mouth.    . Multiple Vitamins-Minerals (OCUVITE-LUTEIN PO) Take 1 capsule by mouth daily.     . ondansetron (ZOFRAN) 4 MG tablet Take 1 tablet (4 mg total) by mouth every 6 (six) hours as needed for nausea. 20 tablet 0  . oxybutynin (DITROPAN) 5 MG tablet Take by mouth.    . pantoprazole (PROTONIX) 20 MG tablet Take by mouth.    . pravastatin (PRAVACHOL) 20 MG tablet Take 20 mg by mouth daily.    . ranitidine (ZANTAC) 300 MG tablet Take by mouth.    . senna (SENOKOT) 8.6 MG TABS tablet Take 1 tablet (8.6 mg total) by mouth daily as needed for mild constipation. 120 each 0  . traMADol (ULTRAM) 50 MG tablet Take 50 mg by mouth 3 (three) times daily as needed for moderate pain or severe pain. pain    . venlafaxine XR (EFFEXOR-XR) 37.5 MG 24 hr capsule Take 1 capsule by mouth daily.     . vitamin E (E-400) 400 UNIT capsule Take 400 Units by mouth daily.    Marland Kitchen zolpidem (AMBIEN) 5 MG tablet Take 5 mg by mouth at bedtime as needed for sleep.     No current facility-administered medications for this visit.    OBJECTIVE: BP 140/80 mmHg  Pulse 63  Temp(Src) 98 F (36.7 C) (Oral)  Resp 18  Wt  143 lb 4.8 oz (65 kg)   Body mass index is 23.85 kg/(m^2).    ECOG FS:1 - Symptomatic but completely ambulatory  General: Well-developed, well-nourished, no acute distress. Eyes: Pink conjunctiva, anicteric sclera. HEENT: Normocephalic, moist mucous membranes, clear oropharnyx. Lungs: Clear to auscultation bilaterally. Heart: Regular rate and rhythm. No rubs, murmurs, or gallops. Musculoskeletal: No edema, cyanosis, or clubbing. Neuro: Alert, answering all questions appropriately. Cranial nerves grossly intact. Skin: No petechiae noted. Bruising noted in various stages of healing. Psych: Normal affect.    LAB RESULTS:  Appointment on 03/19/2016  Component Date Value Ref Range Status  .  WBC 03/19/2016 7.9  3.6 - 11.0 K/uL Final  . RBC 03/19/2016 4.51  3.80 - 5.20 MIL/uL Final  . Hemoglobin 03/19/2016 14.2  12.0 - 16.0 g/dL Final  . HCT 03/19/2016 42.1  35.0 - 47.0 % Final  . MCV 03/19/2016 93.3  80.0 - 100.0 fL Final  . MCH 03/19/2016 31.4  26.0 - 34.0 pg Final  . MCHC 03/19/2016 33.7  32.0 - 36.0 g/dL Final  . RDW 03/19/2016 13.7  11.5 - 14.5 % Final  . Platelets 03/19/2016 75* 150 - 400 K/uL Final   Comment: PLATELET COUNT CONFIRMED BY SMEAR PLATELETS VARIED IN SIZE LARGE PLATELETS NOTED   . Neutrophils Relative % 03/19/2016 68%   Final  . Neutro Abs 03/19/2016 5.3  1.4 - 6.5 K/uL Final  . Lymphocytes Relative 03/19/2016 22%   Final  . Lymphs Abs 03/19/2016 1.8  1.0 - 3.6 K/uL Final  . Monocytes Relative 03/19/2016 8%   Final  . Monocytes Absolute 03/19/2016 0.7  0.2 - 0.9 K/uL Final  . Eosinophils Relative 03/19/2016 1%   Final  . Eosinophils Absolute 03/19/2016 0.1  0 - 0.7 K/uL Final  . Basophils Relative 03/19/2016 1%   Final  . Basophils Absolute 03/19/2016 0.1  0 - 0.1 K/uL Final    STUDIES: No results found.  ASSESSMENT:  ITP  PLAN:   1. Chronic refractory ITP: Patient had a poor response to Prednisone, WinRho, and Rituxan. She is status post splenectomy in  2007. She was previously on Nplate and her response was labile at best. She responds to IVIG, although this is not very durable only lasting 2 weeks. Patient initiated 50 mg Promacta daily with significant improvement of her platelet count to 349. Unfortunately, she had many side effects secondary to the Promacta. Patient's platelet count is slowly improving on 25 mg Promacta daily. Once platelet count reaches 200,000 can consider Promacta to 12.5 mg daily. Return to clinic in 1 month for laboratory work and then in 2 months for laboratory work and further evaluation.  Approximately 30 minutes was spent in discussion of which greater than 50% was consultation.  Patient expressed understanding and was in agreement with this plan. She also understands that She can call clinic at any time with any questions, concerns, or complaints.    Lloyd Huger, MD   03/24/2016 8:44 AM

## 2016-04-01 ENCOUNTER — Other Ambulatory Visit: Payer: Self-pay

## 2016-04-01 ENCOUNTER — Inpatient Hospital Stay (HOSPITAL_BASED_OUTPATIENT_CLINIC_OR_DEPARTMENT_OTHER): Payer: Commercial Managed Care - HMO | Admitting: Oncology

## 2016-04-01 ENCOUNTER — Inpatient Hospital Stay: Payer: Commercial Managed Care - HMO

## 2016-04-01 VITALS — BP 159/84 | HR 71 | Temp 98.3°F | Resp 18 | Wt 140.0 lb

## 2016-04-01 DIAGNOSIS — R531 Weakness: Secondary | ICD-10-CM | POA: Diagnosis not present

## 2016-04-01 DIAGNOSIS — Z9081 Acquired absence of spleen: Secondary | ICD-10-CM | POA: Diagnosis not present

## 2016-04-01 DIAGNOSIS — D693 Immune thrombocytopenic purpura: Secondary | ICD-10-CM | POA: Diagnosis not present

## 2016-04-01 DIAGNOSIS — K219 Gastro-esophageal reflux disease without esophagitis: Secondary | ICD-10-CM

## 2016-04-01 DIAGNOSIS — E7800 Pure hypercholesterolemia, unspecified: Secondary | ICD-10-CM

## 2016-04-01 DIAGNOSIS — R5381 Other malaise: Secondary | ICD-10-CM | POA: Diagnosis not present

## 2016-04-01 DIAGNOSIS — G8929 Other chronic pain: Secondary | ICD-10-CM

## 2016-04-01 DIAGNOSIS — R5383 Other fatigue: Secondary | ICD-10-CM | POA: Diagnosis not present

## 2016-04-01 DIAGNOSIS — R630 Anorexia: Secondary | ICD-10-CM | POA: Diagnosis not present

## 2016-04-01 DIAGNOSIS — Z79899 Other long term (current) drug therapy: Secondary | ICD-10-CM

## 2016-04-01 DIAGNOSIS — F419 Anxiety disorder, unspecified: Secondary | ICD-10-CM

## 2016-04-01 DIAGNOSIS — M1990 Unspecified osteoarthritis, unspecified site: Secondary | ICD-10-CM

## 2016-04-01 DIAGNOSIS — R197 Diarrhea, unspecified: Secondary | ICD-10-CM | POA: Diagnosis not present

## 2016-04-01 DIAGNOSIS — R110 Nausea: Secondary | ICD-10-CM

## 2016-04-01 DIAGNOSIS — Z86711 Personal history of pulmonary embolism: Secondary | ICD-10-CM

## 2016-04-01 LAB — CBC WITH DIFFERENTIAL/PLATELET
BASOS ABS: 0.1 10*3/uL (ref 0–0.1)
Basophils Relative: 1 %
Eosinophils Absolute: 0.2 10*3/uL (ref 0–0.7)
HEMATOCRIT: 45.2 % (ref 35.0–47.0)
Hemoglobin: 15 g/dL (ref 12.0–16.0)
LYMPHS ABS: 2.2 10*3/uL (ref 1.0–3.6)
MCH: 30.8 pg (ref 26.0–34.0)
MCHC: 33.1 g/dL (ref 32.0–36.0)
MCV: 93.1 fL (ref 80.0–100.0)
MONO ABS: 0.8 10*3/uL (ref 0.2–0.9)
Monocytes Relative: 7 %
NEUTROS ABS: 9 10*3/uL — AB (ref 1.4–6.5)
Neutrophils Relative %: 74 %
PLATELETS: 64 10*3/uL — AB (ref 150–400)
RBC: 4.86 MIL/uL (ref 3.80–5.20)
RDW: 13.8 % (ref 11.5–14.5)
WBC: 12.2 10*3/uL — AB (ref 3.6–11.0)

## 2016-04-01 NOTE — Progress Notes (Signed)
[place]  Telephone:(336) [phone]  Fax:(336) [phone]     Ariana [name] DOB: 20-Nov-1932  MR#: [id number]  [id number]  Patient Care Team: Idelle Crouch, MD as PCP - General (Internal Medicine)  CHIEF COMPLAINT:  Chief Complaint  Patient presents with  . ITP    INTERVAL HISTORY:  Patient returns to clinic today as an add-on with complaints of increasing weakness and fatigue, persistent nausea, and poor appetite. She discontinued Promacta approximately 1 week ago with improvement of her symptoms, but she is not back to baseline. She has no neurologic complaints. She denies any fevers. Her rash has resolved. She has no chest pain or shortness of breath. Patient offers no further specific complaints today.  REVIEW OF SYSTEMS:   Review of Systems  Constitutional: Positive for malaise/fatigue. Negative for fever and weight loss.  Respiratory: Negative.  Negative for shortness of breath.   Cardiovascular: Negative.  Negative for chest pain.  Gastrointestinal: Positive for nausea. Negative for abdominal pain, diarrhea, blood in stool and melena.  Genitourinary: Negative.   Musculoskeletal: Negative.   Skin: Negative for rash.  Neurological: Positive for weakness.  Endo/Heme/Allergies: Bruises/bleeds easily.  Psychiatric/Behavioral: Negative.     As per HPI. Otherwise, a complete review of systems is negatve.  PAST MEDICAL HISTORY: Past Medical History  Diagnosis Date  . Hypercholesteremia   . ITP (idiopathic thrombocytopenic purpura)   . Restless legs   . Anemia   . Depression   . Osteoarthritis   . GERD (gastroesophageal reflux disease)   . Hiatal hernia   . Chronic back pain   . Asthma   . Osteoporosis   . Depression   . Anxiety   . Pulmonary emboli ([place])     PAST SURGICAL HISTORY: Past Surgical History  Procedure Laterality Date  . Splenectomy, partial    . Total abdominal hysterectomy    . Lumbar disc surgery    . Carpal  tunnel release    . Esophagogastroduodenoscopy (egd) with propofol N/A 09/10/2015    Procedure: ESOPHAGOGASTRODUODENOSCOPY (EGD) WITH PROPOFOL;  Surgeon: Lollie Sails, MD;  Location: [facility] ENDOSCOPY;  Service: Endoscopy;  Laterality: N/A;    FAMILY HISTORY Family History  Problem Relation Age of Onset  . Stroke Mother     GYNECOLOGIC HISTORY:  No LMP recorded. Patient is postmenopausal.     ADVANCED DIRECTIVES:    HEALTH MAINTENANCE: Social History  Substance Use Topics  . Smoking status: Never Smoker   . Smokeless tobacco: Never Used  . Alcohol Use: No     Allergies  Allergen Reactions  . Aspirin     Other reaction(s): Distress (finding)  . Diazepam     Other reaction(s): Itching of Skin sneezing  . Morphine     Other reaction(s): Itching of Skin  . Other     Other reaction(s): Unknown seasonal allergies  . Tetanus Toxoids     Other reaction(s): Localized superficial swelling of skin    Current Outpatient Prescriptions  Medication Sig Dispense Refill  . acetaminophen (TYLENOL) 325 MG tablet Take 2 tablets (650 mg total) by mouth every 6 (six) hours as needed for mild pain (or Fever >/= 101). 100 tablet 0  . albuterol (PROVENTIL HFA;VENTOLIN HFA) 108 (90 BASE) MCG/ACT inhaler Inhale 2 puffs into the lungs every 6 (six) hours as needed for wheezing or shortness of breath.    . ALPRAZolam (XANAX) 0.25 MG tablet TAKE 1 TABLET TWICE DAILY AS NEEDED FOR SLEEP    . BUDESONIDE-FORMOTEROL FUMARATE  IN Inhale 2 puffs into the lungs 2 (two) times daily as needed (for shortness of breath and wheezing.).     Marland Kitchen buPROPion (WELLBUTRIN XL) 150 MG 24 hr tablet Take by mouth.    . busPIRone (BUSPAR) 15 MG tablet Take by mouth.    . Calcium Carbonate-Vitamin D (CALCIUM 600+D) 600-400 MG-UNIT per tablet Take 1 tablet by mouth daily.    . citalopram (CELEXA) 40 MG tablet Take 40 mg by mouth daily.    . Cyanocobalamin (RA VITAMIN B-12 TR) 1000 MCG TBCR Take 1,000 mcg by mouth daily.      Marland Kitchen docusate sodium (COLACE) 100 MG capsule Take 1 capsule (100 mg total) by mouth 2 (two) times daily as needed for mild constipation. 10 capsule 0  . donepezil (ARICEPT) 5 MG tablet Take 5 mg by mouth daily.    Marland Kitchen gabapentin (NEURONTIN) 300 MG capsule 300mg  orally 4 times a day    . HYDROcodone-acetaminophen (NORCO/VICODIN) 5-325 MG per tablet Reported on 10/17/2015    . levothyroxine (SYNTHROID, LEVOTHROID) 50 MCG tablet Take 50 mcg by mouth daily before breakfast. Take 30 to 60 minutes before breakfast.    . memantine (NAMENDA) 5 MG tablet Take by mouth.    . Multiple Vitamins-Minerals (OCUVITE-LUTEIN PO) Take 1 capsule by mouth daily.     . ondansetron (ZOFRAN) 4 MG tablet Take 1 tablet (4 mg total) by mouth every 6 (six) hours as needed for nausea. 20 tablet 0  . oxybutynin (DITROPAN) 5 MG tablet Take by mouth.    . pantoprazole (PROTONIX) 20 MG tablet Take by mouth.    . pravastatin (PRAVACHOL) 20 MG tablet Take 20 mg by mouth daily.    . ranitidine (ZANTAC) 300 MG tablet Take by mouth.    . senna (SENOKOT) 8.6 MG TABS tablet Take 1 tablet (8.6 mg total) by mouth daily as needed for mild constipation. 120 each 0  . traMADol (ULTRAM) 50 MG tablet Take 50 mg by mouth 3 (three) times daily as needed for moderate pain or severe pain. pain    . venlafaxine XR (EFFEXOR-XR) 37.5 MG 24 hr capsule Take 1 capsule by mouth daily.     . vitamin E (E-400) 400 UNIT capsule Take 400 Units by mouth daily.    Marland Kitchen zolpidem (AMBIEN) 5 MG tablet Take 5 mg by mouth at bedtime as needed for sleep.    Marland Kitchen eltrombopag (PROMACTA) 25 MG tablet Take 1 tablet (25 mg total) by mouth daily. Take on an empty stomach, 1 hour before a meal or 2 hours after. (Patient not taking: Reported on 04/01/2016) 30 tablet 4   No current facility-administered medications for this visit.    OBJECTIVE: BP 159/84 mmHg  Pulse 71  Temp(Src) 98.3 F (36.8 C) (Tympanic)  Resp 18  Wt 140 lb (63.504 kg)   Body mass index is 23.3 kg/(m^2).     ECOG FS:1 - Symptomatic but completely ambulatory  General: Well-developed, well-nourished, no acute distress. Eyes: Pink conjunctiva, anicteric sclera. HEENT: Normocephalic, moist mucous membranes, clear oropharnyx. Lungs: Clear to auscultation bilaterally. Heart: Regular rate and rhythm. No rubs, murmurs, or gallops. Musculoskeletal: No edema, cyanosis, or clubbing. Neuro: Alert, answering all questions appropriately. Cranial nerves grossly intact. Skin: No petechiae noted. Bruising noted in various stages of healing. Psych: Normal affect.    LAB RESULTS:  Orders Only on 04/01/2016  Component Date Value Ref Range Status  . WBC 04/01/2016 12.2* 3.6 - 11.0 K/uL Final  . RBC 04/01/2016 4.86  3.80 - 5.20 MIL/uL Final  . Hemoglobin 04/01/2016 15.0  12.0 - 16.0 g/dL Final  . HCT 04/01/2016 45.2  35.0 - 47.0 % Final  . MCV 04/01/2016 93.1  80.0 - 100.0 fL Final  . MCH 04/01/2016 30.8  26.0 - 34.0 pg Final  . MCHC 04/01/2016 33.1  32.0 - 36.0 g/dL Final  . RDW 04/01/2016 13.8  11.5 - 14.5 % Final  . Platelets 04/01/2016 64* 150 - 400 K/uL Final   PLATELET COUNT CONFIRMED BY SMEAR  . Neutrophils Relative % 04/01/2016 74%   Final  . Neutro Abs 04/01/2016 9.0* 1.4 - 6.5 K/uL Final  . Lymphocytes Relative 04/01/2016 18%   Final  . Lymphs Abs 04/01/2016 2.2  1.0 - 3.6 K/uL Final  . Monocytes Relative 04/01/2016 7%   Final  . Monocytes Absolute 04/01/2016 0.8  0.2 - 0.9 K/uL Final  . Eosinophils Relative 04/01/2016 2%   Final  . Eosinophils Absolute 04/01/2016 0.2  0 - 0.7 K/uL Final  . Basophils Relative 04/01/2016 1%   Final  . Basophils Absolute 04/01/2016 0.1  0 - 0.1 K/uL Final    STUDIES: No results found.  ASSESSMENT:  ITP  PLAN:   1. Chronic refractory ITP: Patient had a poor response to Prednisone, WinRho, and Rituxan. She is status post splenectomy in 2007. She was previously on Nplate and her response was labile at best. She responds to IVIG, although this is not very  durable only lasting 2 weeks. Patient initiated 50 mg Promacta daily with significant improvement of her platelet count to 349, but was discontinued second side effects. Patient's platelet count was slowly improving on 25 mg Promacta daily, but she is now having persistent side effects and treatment was discontinued approximately one week ago. Her platelets have declined slightly to 64 today. Patient also stated she financially can no longer afford Promacta. As we attempt to resolve the economic problems with taking this drug prior to reinitiating her on a decreased dose is 12.5 mg daily, will reattempt treatment with Nplate. Patient will return to clinic in 1 week to receive her first dose of Nplate. Previously, patient received a total of 7 injections of Nplate most recently on July 19, 2015. Max dose of Nplate is 10 mcg/kg, but patient only received a maximum dose of 3 mcg/kg on April 05, 2015. Will re-initiate treatment at 3 mcg/kg weekly an increased dose from there. Will continue this weekly until improvement of her platelets or Promacta can be re-initiated. 2. Nausea: Possibly secondary to treatment, monitor.  Approximately 30 minutes was spent in discussion of which greater than 50% was consultation.  Patient expressed understanding and was in agreement with this plan. She also understands that She can call clinic at any time with any questions, concerns, or complaints.    Lloyd Huger, MD   04/01/2016 12:45 PM

## 2016-04-01 NOTE — Progress Notes (Signed)
States is feeling weak and fatigued today. Had occasional nausea when taking promacta. Symptoms improving since stopping medication.

## 2016-04-06 ENCOUNTER — Telehealth: Payer: Self-pay | Admitting: *Deleted

## 2016-04-06 NOTE — Telephone Encounter (Signed)
I spoke with Ariana [name] last week, patient approved for additional funding for Promacta. Patients family called in to follow up regarding plan for Promacta. Are you planning to restart Promacta, if so when and what dose?

## 2016-04-08 ENCOUNTER — Inpatient Hospital Stay (HOSPITAL_BASED_OUTPATIENT_CLINIC_OR_DEPARTMENT_OTHER): Payer: Commercial Managed Care - HMO | Admitting: Oncology

## 2016-04-08 ENCOUNTER — Inpatient Hospital Stay: Payer: Commercial Managed Care - HMO

## 2016-04-08 ENCOUNTER — Ambulatory Visit: Payer: Commercial Managed Care - HMO

## 2016-04-08 ENCOUNTER — Encounter (INDEPENDENT_AMBULATORY_CARE_PROVIDER_SITE_OTHER): Payer: Self-pay

## 2016-04-08 VITALS — BP 134/73 | HR 61 | Temp 97.1°F | Resp 18 | Wt 140.1 lb

## 2016-04-08 DIAGNOSIS — D693 Immune thrombocytopenic purpura: Secondary | ICD-10-CM

## 2016-04-08 DIAGNOSIS — E7800 Pure hypercholesterolemia, unspecified: Secondary | ICD-10-CM

## 2016-04-08 DIAGNOSIS — G2581 Restless legs syndrome: Secondary | ICD-10-CM

## 2016-04-08 DIAGNOSIS — K219 Gastro-esophageal reflux disease without esophagitis: Secondary | ICD-10-CM

## 2016-04-08 DIAGNOSIS — Z86711 Personal history of pulmonary embolism: Secondary | ICD-10-CM

## 2016-04-08 DIAGNOSIS — R531 Weakness: Secondary | ICD-10-CM | POA: Diagnosis not present

## 2016-04-08 DIAGNOSIS — G8929 Other chronic pain: Secondary | ICD-10-CM

## 2016-04-08 DIAGNOSIS — Z79899 Other long term (current) drug therapy: Secondary | ICD-10-CM

## 2016-04-08 DIAGNOSIS — M1990 Unspecified osteoarthritis, unspecified site: Secondary | ICD-10-CM

## 2016-04-08 DIAGNOSIS — Z9081 Acquired absence of spleen: Secondary | ICD-10-CM | POA: Diagnosis not present

## 2016-04-08 DIAGNOSIS — R110 Nausea: Secondary | ICD-10-CM | POA: Diagnosis not present

## 2016-04-08 DIAGNOSIS — F418 Other specified anxiety disorders: Secondary | ICD-10-CM

## 2016-04-08 DIAGNOSIS — R5383 Other fatigue: Secondary | ICD-10-CM | POA: Diagnosis not present

## 2016-04-08 DIAGNOSIS — R5381 Other malaise: Secondary | ICD-10-CM | POA: Diagnosis not present

## 2016-04-08 DIAGNOSIS — M549 Dorsalgia, unspecified: Secondary | ICD-10-CM

## 2016-04-08 DIAGNOSIS — R630 Anorexia: Secondary | ICD-10-CM | POA: Diagnosis not present

## 2016-04-08 DIAGNOSIS — R197 Diarrhea, unspecified: Secondary | ICD-10-CM | POA: Diagnosis not present

## 2016-04-08 MED ORDER — ROMIPLOSTIM 250 MCG ~~LOC~~ SOLR
3.0000 ug/kg | Freq: Once | SUBCUTANEOUS | Status: AC
  Administered 2016-04-08: 190 ug via SUBCUTANEOUS
  Filled 2016-04-08: qty 0.38

## 2016-04-08 NOTE — Progress Notes (Signed)
States is feeling well. Offers no complaints. 

## 2016-04-12 NOTE — Progress Notes (Signed)
[place]  Telephone:(336) [phone]  Fax:(336) [phone]     [name] DOB: [birth year]  MR#: [id number]  [id number]  Patient Care Team: Idelle Crouch, MD as PCP - General (Internal Medicine)  CHIEF COMPLAINT:  Chief Complaint  Patient presents with  . ITP    INTERVAL HISTORY:  Patient returns to clinic today to reinitiate Nplate. She feels improved since discontinuing Promacta. She does not complain of weakness or fatigue today. She continues to have easy bruising. She has no neurologic complaints. She denies any fevers. Her rash has resolved. She has no chest pain or shortness of breath. She denies any nausea, vomiting, constipation, or diarrhea. She has no urinary complaints. Patient offers no further specific complaints today.  REVIEW OF SYSTEMS:   Review of Systems  Constitutional: Negative for fever, weight loss and malaise/fatigue.  Respiratory: Negative.  Negative for shortness of breath.   Cardiovascular: Negative.  Negative for chest pain.  Gastrointestinal: Negative for nausea, abdominal pain, diarrhea, blood in stool and melena.  Genitourinary: Negative.   Musculoskeletal: Negative.   Skin: Negative for rash.  Neurological: Negative for weakness.  Endo/Heme/Allergies: Does not bruise/bleed easily.  Psychiatric/Behavioral: Negative.     As per HPI. Otherwise, a complete review of systems is negatve.  PAST MEDICAL HISTORY: Past Medical History  Diagnosis Date  . Hypercholesteremia   . ITP (idiopathic thrombocytopenic purpura)   . Restless legs   . Anemia   . Depression   . Osteoarthritis   . GERD (gastroesophageal reflux disease)   . Hiatal hernia   . Chronic back pain   . Asthma   . Osteoporosis   . Depression   . Anxiety   . Pulmonary emboli ([place])     PAST SURGICAL HISTORY: Past Surgical History  Procedure Laterality Date  . Splenectomy, partial    . Total abdominal hysterectomy    . Lumbar disc surgery      . Carpal tunnel release    . Esophagogastroduodenoscopy (egd) with propofol N/A 09/10/2015    Procedure: ESOPHAGOGASTRODUODENOSCOPY (EGD) WITH PROPOFOL;  Surgeon: Lollie Sails, MD;  Location: [facility] ENDOSCOPY;  Service: Endoscopy;  Laterality: N/A;    FAMILY HISTORY Family History  Problem Relation Age of Onset  . Stroke Mother     GYNECOLOGIC HISTORY:  No LMP recorded. Patient is postmenopausal.     ADVANCED DIRECTIVES:    HEALTH MAINTENANCE: Social History  Substance Use Topics  . Smoking status: Never Smoker   . Smokeless tobacco: Never Used  . Alcohol Use: No     Allergies  Allergen Reactions  . Aspirin     Other reaction(s): Distress (finding)  . Diazepam     Other reaction(s): Itching of Skin sneezing  . Morphine     Other reaction(s): Itching of Skin  . Other     Other reaction(s): Unknown seasonal allergies  . Tetanus Toxoids     Other reaction(s): Localized superficial swelling of skin    Current Outpatient Prescriptions  Medication Sig Dispense Refill  . acetaminophen (TYLENOL) 325 MG tablet Take 2 tablets (650 mg total) by mouth every 6 (six) hours as needed for mild pain (or Fever >/= 101). 100 tablet 0  . albuterol (PROVENTIL HFA;VENTOLIN HFA) 108 (90 BASE) MCG/ACT inhaler Inhale 2 puffs into the lungs every 6 (six) hours as needed for wheezing or shortness of breath.    . ALPRAZolam (XANAX) 0.25 MG tablet TAKE 1 TABLET TWICE DAILY AS NEEDED FOR SLEEP    .  BUDESONIDE-FORMOTEROL FUMARATE IN Inhale 2 puffs into the lungs 2 (two) times daily as needed (for shortness of breath and wheezing.).     Marland Kitchen buPROPion (WELLBUTRIN XL) 150 MG 24 hr tablet Take by mouth.    . busPIRone (BUSPAR) 15 MG tablet Take by mouth.    . Calcium Carbonate-Vitamin D (CALCIUM 600+D) 600-400 MG-UNIT per tablet Take 1 tablet by mouth daily.    . citalopram (CELEXA) 40 MG tablet Take 40 mg by mouth daily.    . Cyanocobalamin (RA VITAMIN B-12 TR) 1000 MCG TBCR Take 1,000 mcg by  mouth daily.     Marland Kitchen docusate sodium (COLACE) 100 MG capsule Take 1 capsule (100 mg total) by mouth 2 (two) times daily as needed for mild constipation. 10 capsule 0  . donepezil (ARICEPT) 5 MG tablet Take 5 mg by mouth daily.    Marland Kitchen gabapentin (NEURONTIN) 300 MG capsule 300mg  orally 4 times a day    . HYDROcodone-acetaminophen (NORCO/VICODIN) 5-325 MG per tablet Reported on 10/17/2015    . levothyroxine (SYNTHROID, LEVOTHROID) 50 MCG tablet Take 50 mcg by mouth daily before breakfast. Take 30 to 60 minutes before breakfast.    . memantine (NAMENDA) 5 MG tablet Take by mouth.    . Multiple Vitamins-Minerals (OCUVITE-LUTEIN PO) Take 1 capsule by mouth daily.     . ondansetron (ZOFRAN) 4 MG tablet Take 1 tablet (4 mg total) by mouth every 6 (six) hours as needed for nausea. 20 tablet 0  . oxybutynin (DITROPAN) 5 MG tablet Take by mouth.    . pantoprazole (PROTONIX) 20 MG tablet Take by mouth.    . pravastatin (PRAVACHOL) 20 MG tablet Take 20 mg by mouth daily.    . ranitidine (ZANTAC) 300 MG tablet Take by mouth.    . senna (SENOKOT) 8.6 MG TABS tablet Take 1 tablet (8.6 mg total) by mouth daily as needed for mild constipation. 120 each 0  . traMADol (ULTRAM) 50 MG tablet Take 50 mg by mouth 3 (three) times daily as needed for moderate pain or severe pain. pain    . venlafaxine XR (EFFEXOR-XR) 37.5 MG 24 hr capsule Take 1 capsule by mouth daily.     . vitamin E (E-400) 400 UNIT capsule Take 400 Units by mouth daily.    Marland Kitchen zolpidem (AMBIEN) 5 MG tablet Take 5 mg by mouth at bedtime as needed for sleep.    Marland Kitchen eltrombopag (PROMACTA) 25 MG tablet Take 1 tablet (25 mg total) by mouth daily. Take on an empty stomach, 1 hour before a meal or 2 hours after. (Patient not taking: Reported on 04/01/2016) 30 tablet 4   No current facility-administered medications for this visit.    OBJECTIVE: BP 134/73 mmHg  Pulse 61  Temp(Src) 97.1 F (36.2 C) (Tympanic)  Resp 18  Wt 140 lb 1.6 oz (63.55 kg)   Body mass index  is 23.31 kg/(m^2).    ECOG FS:1 - Symptomatic but completely ambulatory  General: Well-developed, well-nourished, no acute distress. Eyes: Pink conjunctiva, anicteric sclera. HEENT: Normocephalic, moist mucous membranes, clear oropharnyx. Lungs: Clear to auscultation bilaterally. Heart: Regular rate and rhythm. No rubs, murmurs, or gallops. Musculoskeletal: No edema, cyanosis, or clubbing. Neuro: Alert, answering all questions appropriately. Cranial nerves grossly intact. Skin: No petechiae noted. Bruising noted in various stages of healing. Psych: Normal affect.    LAB RESULTS:  No visits with results within 3 Day(s) from this visit. Latest known visit with results is:  Orders Only on 04/01/2016  Component  Date Value Ref Range Status  . WBC 04/01/2016 12.2* 3.6 - 11.0 K/uL Final  . RBC 04/01/2016 4.86  3.80 - 5.20 MIL/uL Final  . Hemoglobin 04/01/2016 15.0  12.0 - 16.0 g/dL Final  . HCT 04/01/2016 45.2  35.0 - 47.0 % Final  . MCV 04/01/2016 93.1  80.0 - 100.0 fL Final  . MCH 04/01/2016 30.8  26.0 - 34.0 pg Final  . MCHC 04/01/2016 33.1  32.0 - 36.0 g/dL Final  . RDW 04/01/2016 13.8  11.5 - 14.5 % Final  . Platelets 04/01/2016 64* 150 - 400 K/uL Final   PLATELET COUNT CONFIRMED BY SMEAR  . Neutrophils Relative % 04/01/2016 74%   Final  . Neutro Abs 04/01/2016 9.0* 1.4 - 6.5 K/uL Final  . Lymphocytes Relative 04/01/2016 18%   Final  . Lymphs Abs 04/01/2016 2.2  1.0 - 3.6 K/uL Final  . Monocytes Relative 04/01/2016 7%   Final  . Monocytes Absolute 04/01/2016 0.8  0.2 - 0.9 K/uL Final  . Eosinophils Relative 04/01/2016 2%   Final  . Eosinophils Absolute 04/01/2016 0.2  0 - 0.7 K/uL Final  . Basophils Relative 04/01/2016 1%   Final  . Basophils Absolute 04/01/2016 0.1  0 - 0.1 K/uL Final    STUDIES: No results found.  ASSESSMENT:  ITP  PLAN:   1. Chronic refractory ITP: Patient had a poor response to Prednisone, WinRho, and Rituxan. She is status post splenectomy in 2007.  She was previously on Nplate and her response was labile at best. She responds to IVIG, although this is not very durable only lasting 2 weeks. Patient initiated 50 mg Promacta daily with significant improvement of her platelet count to 349, but was discontinued second side effects. Patient's platelet count was slowly improving on 25 mg Promacta daily, but she is now having persistent side effects and treatment was discontinued. Patient now has been approved for financial support for Promacta. We had a lengthy discussion whether to pursue a decreased dose of 12.5 mg daily or switch to Nplate. Patient has elected to pursue Nplate and consider Promacta at a later date. Previously, patient received a total of 7 injections of Nplate most recently on July 19, 2015. Max dose of Nplate is 10 mcg/kg, but patient only received a maximum dose of 3 mcg/kg on April 05, 2015. Proceed with 3 mcg/kg Nplate today.  Return to clinic in 1 week for repeat laboratory work and consideration of Nplate and then in 2 weeks for further evaluation.  2. Nausea: Resolved.  Approximately 30 minutes was spent in discussion of which greater than 50% was consultation.  Patient expressed understanding and was in agreement with this plan. She also understands that She can call clinic at any time with any questions, concerns, or complaints.    Lloyd Huger, MD   04/12/2016 2:32 PM

## 2016-04-15 ENCOUNTER — Inpatient Hospital Stay: Payer: Commercial Managed Care - HMO | Attending: Oncology

## 2016-04-15 ENCOUNTER — Inpatient Hospital Stay: Payer: Commercial Managed Care - HMO

## 2016-04-15 DIAGNOSIS — D693 Immune thrombocytopenic purpura: Secondary | ICD-10-CM

## 2016-04-15 DIAGNOSIS — Z79899 Other long term (current) drug therapy: Secondary | ICD-10-CM | POA: Diagnosis not present

## 2016-04-15 DIAGNOSIS — G8929 Other chronic pain: Secondary | ICD-10-CM | POA: Diagnosis not present

## 2016-04-15 DIAGNOSIS — K219 Gastro-esophageal reflux disease without esophagitis: Secondary | ICD-10-CM | POA: Diagnosis not present

## 2016-04-15 DIAGNOSIS — Z9081 Acquired absence of spleen: Secondary | ICD-10-CM | POA: Diagnosis not present

## 2016-04-15 DIAGNOSIS — E7800 Pure hypercholesterolemia, unspecified: Secondary | ICD-10-CM | POA: Insufficient documentation

## 2016-04-15 DIAGNOSIS — M549 Dorsalgia, unspecified: Secondary | ICD-10-CM | POA: Insufficient documentation

## 2016-04-15 DIAGNOSIS — M1990 Unspecified osteoarthritis, unspecified site: Secondary | ICD-10-CM | POA: Diagnosis not present

## 2016-04-15 DIAGNOSIS — Z86711 Personal history of pulmonary embolism: Secondary | ICD-10-CM | POA: Diagnosis not present

## 2016-04-15 LAB — CBC WITH DIFFERENTIAL/PLATELET
BASOS ABS: 0.1 10*3/uL (ref 0–0.1)
EOS ABS: 0.4 10*3/uL (ref 0–0.7)
HCT: 41.2 % (ref 35.0–47.0)
Hemoglobin: 13.7 g/dL (ref 12.0–16.0)
Lymphs Abs: 2.2 10*3/uL (ref 1.0–3.6)
MCH: 30.6 pg (ref 26.0–34.0)
MCHC: 33.3 g/dL (ref 32.0–36.0)
MCV: 92 fL (ref 80.0–100.0)
MONO ABS: 0.8 10*3/uL (ref 0.2–0.9)
Monocytes Relative: 8 %
Neutro Abs: 6.1 10*3/uL (ref 1.4–6.5)
Neutrophils Relative %: 64 %
PLATELETS: 73 10*3/uL — AB (ref 150–440)
RBC: 4.48 MIL/uL (ref 3.80–5.20)
RDW: 14 % (ref 11.5–14.5)
WBC: 9.6 10*3/uL (ref 3.6–11.0)

## 2016-04-15 MED ORDER — ROMIPLOSTIM 250 MCG ~~LOC~~ SOLR
3.0000 ug/kg | Freq: Once | SUBCUTANEOUS | Status: AC
  Administered 2016-04-15: 190 ug via SUBCUTANEOUS
  Filled 2016-04-15: qty 0.38

## 2016-04-16 ENCOUNTER — Other Ambulatory Visit: Payer: Commercial Managed Care - HMO

## 2016-04-22 ENCOUNTER — Inpatient Hospital Stay: Payer: Commercial Managed Care - HMO

## 2016-04-22 ENCOUNTER — Inpatient Hospital Stay (HOSPITAL_BASED_OUTPATIENT_CLINIC_OR_DEPARTMENT_OTHER): Payer: Commercial Managed Care - HMO | Admitting: Oncology

## 2016-04-22 VITALS — BP 133/75 | HR 61 | Temp 96.3°F | Resp 18 | Wt 142.5 lb

## 2016-04-22 DIAGNOSIS — D693 Immune thrombocytopenic purpura: Secondary | ICD-10-CM

## 2016-04-22 DIAGNOSIS — Z9081 Acquired absence of spleen: Secondary | ICD-10-CM

## 2016-04-22 DIAGNOSIS — G8929 Other chronic pain: Secondary | ICD-10-CM

## 2016-04-22 DIAGNOSIS — K219 Gastro-esophageal reflux disease without esophagitis: Secondary | ICD-10-CM

## 2016-04-22 DIAGNOSIS — Z86711 Personal history of pulmonary embolism: Secondary | ICD-10-CM | POA: Diagnosis not present

## 2016-04-22 DIAGNOSIS — Z79899 Other long term (current) drug therapy: Secondary | ICD-10-CM

## 2016-04-22 DIAGNOSIS — M1990 Unspecified osteoarthritis, unspecified site: Secondary | ICD-10-CM

## 2016-04-22 DIAGNOSIS — E7800 Pure hypercholesterolemia, unspecified: Secondary | ICD-10-CM

## 2016-04-22 DIAGNOSIS — M549 Dorsalgia, unspecified: Secondary | ICD-10-CM

## 2016-04-22 LAB — CBC WITH DIFFERENTIAL/PLATELET
BASOS ABS: 0.1 10*3/uL (ref 0–0.1)
Basophils Relative: 1 %
EOS ABS: 0.4 10*3/uL (ref 0–0.7)
HCT: 40.9 % (ref 35.0–47.0)
Hemoglobin: 13.5 g/dL (ref 12.0–16.0)
Lymphocytes Relative: 26 %
Lymphs Abs: 2.5 10*3/uL (ref 1.0–3.6)
MCH: 30.5 pg (ref 26.0–34.0)
MCHC: 33.1 g/dL (ref 32.0–36.0)
MCV: 92.1 fL (ref 80.0–100.0)
Monocytes Absolute: 1 10*3/uL — ABNORMAL HIGH (ref 0.2–0.9)
Monocytes Relative: 10 %
Neutro Abs: 5.5 10*3/uL (ref 1.4–6.5)
Neutrophils Relative %: 59 %
PLATELETS: 165 10*3/uL (ref 150–440)
RBC: 4.44 MIL/uL (ref 3.80–5.20)
RDW: 14 % (ref 11.5–14.5)
WBC: 9.5 10*3/uL (ref 3.6–11.0)

## 2016-04-22 NOTE — Progress Notes (Signed)
Ariana [name] DOB: [birth year]  MR#: [id number]  [id number]  Patient Care Team: Idelle Crouch, MD as PCP - General (Internal Medicine)  CHIEF COMPLAINT: Chronic refractory ITP  INTERVAL HISTORY:  Patient returns to clinic today for repeat laboratory work and consideration of additional Nplate. She currently feels well and is asymptomatic. She does not complain of weakness or fatigue today. She does not complain of easy bruising. She has no neurologic complaints. She denies any fevers. She has no chest pain or shortness of breath. She denies any nausea, vomiting, constipation, or diarrhea. She has no urinary complaints. Patient offers no specific complaints today.  REVIEW OF SYSTEMS:   Review of Systems  Constitutional: Negative for fever, weight loss and malaise/fatigue.  Respiratory: Negative.  Negative for shortness of breath.   Cardiovascular: Negative.  Negative for chest pain.  Gastrointestinal: Negative for nausea, abdominal pain, diarrhea, blood in stool and melena.  Genitourinary: Negative.   Musculoskeletal: Negative.   Skin: Negative for rash.  Neurological: Negative for weakness.  Endo/Heme/Allergies: Does not bruise/bleed easily.  Psychiatric/Behavioral: Negative.     As per HPI. Otherwise, a complete review of systems is negatve.  PAST MEDICAL HISTORY: Past Medical History  Diagnosis Date  . Hypercholesteremia   . ITP (idiopathic thrombocytopenic purpura)   . Restless legs   . Anemia   . Depression   . Osteoarthritis   . GERD (gastroesophageal reflux disease)   . Hiatal hernia   . Chronic back pain   . Asthma   . Osteoporosis   . Depression   . Anxiety   . Pulmonary emboli ([place])     PAST SURGICAL HISTORY: Past Surgical History  Procedure Laterality Date  . Splenectomy, partial    . Total abdominal hysterectomy    . Lumbar disc surgery    . Carpal tunnel  release    . Esophagogastroduodenoscopy (egd) with propofol N/A 09/10/2015    Procedure: ESOPHAGOGASTRODUODENOSCOPY (EGD) WITH PROPOFOL;  Surgeon: Lollie Sails, MD;  Location: [facility] ENDOSCOPY;  Service: Endoscopy;  Laterality: N/A;    FAMILY HISTORY Family History  Problem Relation Age of Onset  . Stroke Mother     GYNECOLOGIC HISTORY:  No LMP recorded. Patient is postmenopausal.     ADVANCED DIRECTIVES:    HEALTH MAINTENANCE: Social History  Substance Use Topics  . Smoking status: Never Smoker   . Smokeless tobacco: Never Used  . Alcohol Use: No     Allergies  Allergen Reactions  . Aspirin     Other reaction(s): Distress (finding)  . Diazepam     Other reaction(s): Itching of Skin sneezing  . Morphine     Other reaction(s): Itching of Skin  . Other     Other reaction(s): Unknown seasonal allergies  . Tetanus Toxoids     Other reaction(s): Localized superficial swelling of skin    Current Outpatient Prescriptions  Medication Sig Dispense Refill  . acetaminophen (TYLENOL) 325 MG tablet Take 2 tablets (650 mg total) by mouth every 6 (six) hours as needed for mild pain (or Fever >/= 101). 100 tablet 0  . albuterol (PROVENTIL HFA;VENTOLIN HFA) 108 (90 BASE) MCG/ACT inhaler Inhale 2 puffs into the lungs every 6 (six) hours as needed for wheezing or shortness of breath.    . ALPRAZolam (XANAX) 0.25 MG tablet TAKE 1 TABLET TWICE DAILY AS NEEDED FOR SLEEP    . BUDESONIDE-FORMOTEROL FUMARATE IN Inhale 2 puffs into  the lungs 2 (two) times daily as needed (for shortness of breath and wheezing.).     Marland Kitchen buPROPion (WELLBUTRIN XL) 150 MG 24 hr tablet Take by mouth.    . busPIRone (BUSPAR) 15 MG tablet Take by mouth.    . Calcium Carbonate-Vitamin D (CALCIUM 600+D) 600-400 MG-UNIT per tablet Take 1 tablet by mouth daily.    . citalopram (CELEXA) 40 MG tablet Take 40 mg by mouth daily.    . Cyanocobalamin (RA VITAMIN B-12 TR) 1000 MCG TBCR Take 1,000 mcg by mouth daily.     Marland Kitchen  docusate sodium (COLACE) 100 MG capsule Take 1 capsule (100 mg total) by mouth 2 (two) times daily as needed for mild constipation. 10 capsule 0  . donepezil (ARICEPT) 5 MG tablet Take 5 mg by mouth daily.    Marland Kitchen gabapentin (NEURONTIN) 300 MG capsule 300mg  orally 4 times a day    . HYDROcodone-acetaminophen (NORCO/VICODIN) 5-325 MG per tablet Reported on 10/17/2015    . levothyroxine (SYNTHROID, LEVOTHROID) 50 MCG tablet Take 50 mcg by mouth daily before breakfast. Take 30 to 60 minutes before breakfast.    . memantine (NAMENDA) 5 MG tablet Take by mouth.    . Multiple Vitamins-Minerals (OCUVITE-LUTEIN PO) Take 1 capsule by mouth daily.     . ondansetron (ZOFRAN) 4 MG tablet Take 1 tablet (4 mg total) by mouth every 6 (six) hours as needed for nausea. 20 tablet 0  . oxybutynin (DITROPAN) 5 MG tablet Take by mouth.    . pantoprazole (PROTONIX) 20 MG tablet Take by mouth.    . pravastatin (PRAVACHOL) 20 MG tablet Take 20 mg by mouth daily.    . ranitidine (ZANTAC) 300 MG tablet Take by mouth.    . senna (SENOKOT) 8.6 MG TABS tablet Take 1 tablet (8.6 mg total) by mouth daily as needed for mild constipation. 120 each 0  . traMADol (ULTRAM) 50 MG tablet Take 50 mg by mouth 3 (three) times daily as needed for moderate pain or severe pain. pain    . venlafaxine XR (EFFEXOR-XR) 37.5 MG 24 hr capsule Take 1 capsule by mouth daily.     . vitamin E (E-400) 400 UNIT capsule Take 400 Units by mouth daily.    Marland Kitchen zolpidem (AMBIEN) 5 MG tablet Take 5 mg by mouth at bedtime as needed for sleep.     No current facility-administered medications for this visit.    OBJECTIVE: BP 133/75 mmHg  Pulse 61  Temp(Src) 96.3 F (35.7 C) (Tympanic)  Resp 18  Wt 142 lb 8.4 oz (64.65 kg)   Body mass index is 23.72 kg/(m^2).    ECOG FS:1 - Symptomatic but completely ambulatory  General: Well-developed, well-nourished, no acute distress. Eyes: Pink conjunctiva, anicteric sclera. HEENT: Normocephalic, moist mucous membranes,  clear oropharnyx. Lungs: Clear to auscultation bilaterally. Heart: Regular rate and rhythm. No rubs, murmurs, or gallops. Musculoskeletal: No edema, cyanosis, or clubbing. Neuro: Alert, answering all questions appropriately. Cranial nerves grossly intact. Skin: No petechiae noted. Bruising noted in various stages of healing. Psych: Normal affect.    LAB RESULTS:  Appointment on 04/22/2016  Component Date Value Ref Range Status  . WBC 04/22/2016 9.5  3.6 - 11.0 K/uL Final  . RBC 04/22/2016 4.44  3.80 - 5.20 MIL/uL Final  . Hemoglobin 04/22/2016 13.5  12.0 - 16.0 g/dL Final  . HCT 04/22/2016 40.9  35.0 - 47.0 % Final  . MCV 04/22/2016 92.1  80.0 - 100.0 fL Final  . [facility] 04/22/2016  30.5  26.0 - 34.0 pg Final  . MCHC 04/22/2016 33.1  32.0 - 36.0 g/dL Final  . RDW 04/22/2016 14.0  11.5 - 14.5 % Final  . Platelets 04/22/2016 165  150 - 440 K/uL Final   Comment: PLATELET COUNT CONFIRMED BY SMEAR LARGE PLATELETS PRESENT PLATELETS VARIED IN SIZE   . Neutrophils Relative % 04/22/2016 59%   Final  . Neutro Abs 04/22/2016 5.5  1.4 - 6.5 K/uL Final  . Lymphocytes Relative 04/22/2016 26%   Final  . Lymphs Abs 04/22/2016 2.5  1.0 - 3.6 K/uL Final  . Monocytes Relative 04/22/2016 10%   Final  . Monocytes Absolute 04/22/2016 1.0* 0.2 - 0.9 K/uL Final  . Eosinophils Relative 04/22/2016 4%   Final  . Eosinophils Absolute 04/22/2016 0.4  0 - 0.7 K/uL Final  . Basophils Relative 04/22/2016 1%   Final  . Basophils Absolute 04/22/2016 0.1  0 - 0.1 K/uL Final    STUDIES: No results found.  ASSESSMENT:  Chronic refractory ITP  PLAN:   1. Chronic refractory ITP: Patient had a poor response to Prednisone, WinRho, and Rituxan. She is status post splenectomy in 2007. Patient has received 2 doses of 3 mcg/kg of Nplate with an excellent response to her platelets that are now 165. She does not require an injection today. Return to clinic weekly for laboratory work and consideration of Nplate if her  platelet count falls below 100. She will then return to clinic in 3 weeks for further evaluation.  Can consider Promacta 12.5mg  at a later date if necessary. Max dose of Nplate is 10 mcg/kg. 2. Nausea: Resolved.  Approximately 30 minutes was spent in discussion of which greater than 50% was consultation.  Patient expressed understanding and was in agreement with this plan. She also understands that She can call clinic at any time with any questions, concerns, or complaints.    Lloyd Huger, MD   04/22/2016 1:47 PM

## 2016-04-22 NOTE — Progress Notes (Signed)
States is feeling well today.

## 2016-04-23 DIAGNOSIS — J209 Acute bronchitis, unspecified: Secondary | ICD-10-CM | POA: Diagnosis not present

## 2016-04-23 DIAGNOSIS — S81812A Laceration without foreign body, left lower leg, initial encounter: Secondary | ICD-10-CM | POA: Diagnosis not present

## 2016-04-29 ENCOUNTER — Inpatient Hospital Stay: Payer: Commercial Managed Care - HMO

## 2016-04-29 DIAGNOSIS — G8929 Other chronic pain: Secondary | ICD-10-CM | POA: Diagnosis not present

## 2016-04-29 DIAGNOSIS — Z9081 Acquired absence of spleen: Secondary | ICD-10-CM | POA: Diagnosis not present

## 2016-04-29 DIAGNOSIS — D693 Immune thrombocytopenic purpura: Secondary | ICD-10-CM

## 2016-04-29 DIAGNOSIS — M1990 Unspecified osteoarthritis, unspecified site: Secondary | ICD-10-CM | POA: Diagnosis not present

## 2016-04-29 DIAGNOSIS — Z79899 Other long term (current) drug therapy: Secondary | ICD-10-CM | POA: Diagnosis not present

## 2016-04-29 DIAGNOSIS — M549 Dorsalgia, unspecified: Secondary | ICD-10-CM | POA: Diagnosis not present

## 2016-04-29 DIAGNOSIS — K219 Gastro-esophageal reflux disease without esophagitis: Secondary | ICD-10-CM | POA: Diagnosis not present

## 2016-04-29 DIAGNOSIS — E7800 Pure hypercholesterolemia, unspecified: Secondary | ICD-10-CM | POA: Diagnosis not present

## 2016-04-29 DIAGNOSIS — Z86711 Personal history of pulmonary embolism: Secondary | ICD-10-CM | POA: Diagnosis not present

## 2016-04-29 LAB — CBC WITH DIFFERENTIAL/PLATELET
Basophils Absolute: 0.1 10*3/uL (ref 0–0.1)
Basophils Relative: 1 %
Eosinophils Absolute: 0.4 10*3/uL (ref 0–0.7)
Eosinophils Relative: 4 %
HEMATOCRIT: 40.4 % (ref 35.0–47.0)
HEMOGLOBIN: 13.4 g/dL (ref 12.0–16.0)
LYMPHS ABS: 3.5 10*3/uL (ref 1.0–3.6)
MCH: 30.6 pg (ref 26.0–34.0)
MCHC: 33.3 g/dL (ref 32.0–36.0)
MCV: 91.9 fL (ref 80.0–100.0)
Monocytes Absolute: 1 10*3/uL — ABNORMAL HIGH (ref 0.2–0.9)
NEUTROS ABS: 5.8 10*3/uL (ref 1.4–6.5)
Platelets: 116 10*3/uL — ABNORMAL LOW (ref 150–440)
RBC: 4.39 MIL/uL (ref 3.80–5.20)
RDW: 13.9 % (ref 11.5–14.5)
WBC: 10.9 10*3/uL (ref 3.6–11.0)

## 2016-04-29 MED ORDER — ROMIPLOSTIM 250 MCG ~~LOC~~ SOLR
3.0000 ug/kg | Freq: Once | SUBCUTANEOUS | Status: AC
  Administered 2016-04-29: 195 ug via SUBCUTANEOUS
  Filled 2016-04-29: qty 0.39

## 2016-05-06 ENCOUNTER — Inpatient Hospital Stay: Payer: Commercial Managed Care - HMO

## 2016-05-06 DIAGNOSIS — M549 Dorsalgia, unspecified: Secondary | ICD-10-CM | POA: Diagnosis not present

## 2016-05-06 DIAGNOSIS — Z9081 Acquired absence of spleen: Secondary | ICD-10-CM | POA: Diagnosis not present

## 2016-05-06 DIAGNOSIS — G8929 Other chronic pain: Secondary | ICD-10-CM | POA: Diagnosis not present

## 2016-05-06 DIAGNOSIS — Z86711 Personal history of pulmonary embolism: Secondary | ICD-10-CM | POA: Diagnosis not present

## 2016-05-06 DIAGNOSIS — Z79899 Other long term (current) drug therapy: Secondary | ICD-10-CM | POA: Diagnosis not present

## 2016-05-06 DIAGNOSIS — D693 Immune thrombocytopenic purpura: Secondary | ICD-10-CM

## 2016-05-06 DIAGNOSIS — E7800 Pure hypercholesterolemia, unspecified: Secondary | ICD-10-CM | POA: Diagnosis not present

## 2016-05-06 DIAGNOSIS — M1990 Unspecified osteoarthritis, unspecified site: Secondary | ICD-10-CM | POA: Diagnosis not present

## 2016-05-06 DIAGNOSIS — K219 Gastro-esophageal reflux disease without esophagitis: Secondary | ICD-10-CM | POA: Diagnosis not present

## 2016-05-06 LAB — CBC WITH DIFFERENTIAL/PLATELET
BASOS ABS: 0.1 10*3/uL (ref 0–0.1)
Eosinophils Absolute: 0.3 10*3/uL (ref 0–0.7)
Eosinophils Relative: 3 %
HEMATOCRIT: 41 % (ref 35.0–47.0)
HEMOGLOBIN: 13.5 g/dL (ref 12.0–16.0)
Lymphocytes Relative: 20 %
Lymphs Abs: 2.5 10*3/uL (ref 1.0–3.6)
MCH: 29.9 pg (ref 26.0–34.0)
MCHC: 32.8 g/dL (ref 32.0–36.0)
MCV: 91.1 fL (ref 80.0–100.0)
Monocytes Absolute: 1.2 10*3/uL — ABNORMAL HIGH (ref 0.2–0.9)
Monocytes Relative: 10 %
NEUTROS ABS: 8.4 10*3/uL — AB (ref 1.4–6.5)
Platelets: 128 10*3/uL — ABNORMAL LOW (ref 150–440)
RBC: 4.5 MIL/uL (ref 3.80–5.20)
RDW: 14.2 % (ref 11.5–14.5)
WBC: 12.5 10*3/uL — AB (ref 3.6–11.0)

## 2016-05-06 NOTE — Progress Notes (Signed)
Platelet count today is 128.  Confirmed with Dr. Grayland Ormond that she is NOT to receive Nplate injection today and will f/u at appt next week.

## 2016-05-14 ENCOUNTER — Inpatient Hospital Stay: Payer: Commercial Managed Care - HMO | Attending: Oncology | Admitting: Oncology

## 2016-05-14 ENCOUNTER — Inpatient Hospital Stay: Payer: Commercial Managed Care - HMO

## 2016-05-14 ENCOUNTER — Encounter: Payer: Self-pay | Admitting: Oncology

## 2016-05-14 ENCOUNTER — Other Ambulatory Visit: Payer: Self-pay

## 2016-05-14 VITALS — BP 133/84 | HR 78 | Temp 95.4°F | Resp 16 | Ht 65.0 in | Wt 138.9 lb

## 2016-05-14 DIAGNOSIS — R5381 Other malaise: Secondary | ICD-10-CM | POA: Diagnosis not present

## 2016-05-14 DIAGNOSIS — Z79899 Other long term (current) drug therapy: Secondary | ICD-10-CM | POA: Diagnosis not present

## 2016-05-14 DIAGNOSIS — R531 Weakness: Secondary | ICD-10-CM | POA: Insufficient documentation

## 2016-05-14 DIAGNOSIS — R5383 Other fatigue: Secondary | ICD-10-CM | POA: Insufficient documentation

## 2016-05-14 DIAGNOSIS — R309 Painful micturition, unspecified: Secondary | ICD-10-CM | POA: Diagnosis not present

## 2016-05-14 DIAGNOSIS — M810 Age-related osteoporosis without current pathological fracture: Secondary | ICD-10-CM | POA: Insufficient documentation

## 2016-05-14 DIAGNOSIS — M1990 Unspecified osteoarthritis, unspecified site: Secondary | ICD-10-CM | POA: Insufficient documentation

## 2016-05-14 DIAGNOSIS — R399 Unspecified symptoms and signs involving the genitourinary system: Secondary | ICD-10-CM

## 2016-05-14 DIAGNOSIS — D693 Immune thrombocytopenic purpura: Secondary | ICD-10-CM

## 2016-05-14 DIAGNOSIS — Z86711 Personal history of pulmonary embolism: Secondary | ICD-10-CM | POA: Diagnosis not present

## 2016-05-14 DIAGNOSIS — E7800 Pure hypercholesterolemia, unspecified: Secondary | ICD-10-CM | POA: Diagnosis not present

## 2016-05-14 DIAGNOSIS — G8929 Other chronic pain: Secondary | ICD-10-CM | POA: Insufficient documentation

## 2016-05-14 DIAGNOSIS — M549 Dorsalgia, unspecified: Secondary | ICD-10-CM | POA: Diagnosis not present

## 2016-05-14 DIAGNOSIS — K219 Gastro-esophageal reflux disease without esophagitis: Secondary | ICD-10-CM | POA: Insufficient documentation

## 2016-05-14 LAB — CBC WITH DIFFERENTIAL/PLATELET
Basophils Absolute: 0.1 10*3/uL (ref 0–0.1)
Basophils Relative: 1 %
EOS ABS: 0.3 10*3/uL (ref 0–0.7)
HCT: 45.1 % (ref 35.0–47.0)
Hemoglobin: 14.9 g/dL (ref 12.0–16.0)
LYMPHS ABS: 2.8 10*3/uL (ref 1.0–3.6)
MCH: 30 pg (ref 26.0–34.0)
MCHC: 32.9 g/dL (ref 32.0–36.0)
MCV: 91 fL (ref 80.0–100.0)
MONO ABS: 0.9 10*3/uL (ref 0.2–0.9)
Monocytes Relative: 8 %
Neutro Abs: 6.9 10*3/uL — ABNORMAL HIGH (ref 1.4–6.5)
Neutrophils Relative %: 63 %
PLATELETS: 163 10*3/uL (ref 150–400)
RBC: 4.96 MIL/uL (ref 3.80–5.20)
RDW: 14.4 % (ref 11.5–14.5)
WBC: 11 10*3/uL (ref 3.6–11.0)

## 2016-05-14 NOTE — Progress Notes (Signed)
[place]  [phone]  [phone]     Ariana [name] DOB: 25-Jan-1933  MR#: [id number]  [id number]  Patient Care Team: Idelle Crouch, MD as PCP - General (Internal Medicine)  CHIEF COMPLAINT: Chronic refractory ITP  INTERVAL HISTORY:  Patient returns to clinic today for repeat laboratory work and consideration of additional Nplate. She currently feels well and is asymptomatic. Her only complaint today is burning with urination. She does not complain of weakness or fatigue today. She does not complain of easy bruising. She has no neurologic complaints. She denies any fevers. She has no chest pain or shortness of breath. She denies any nausea, vomiting, constipation, or diarrhea. She has no urinary complaints. Patient offers no further specific complaints today.  REVIEW OF SYSTEMS:   Review of Systems  Constitutional: Negative for fever, malaise/fatigue and weight loss.  Respiratory: Negative.  Negative for shortness of breath.   Cardiovascular: Negative.  Negative for chest pain.  Gastrointestinal: Negative for abdominal pain, blood in stool, diarrhea, melena and nausea.  Genitourinary: Positive for dysuria. Negative for hematuria.  Musculoskeletal: Negative.   Skin: Negative for rash.  Neurological: Negative for weakness.  Endo/Heme/Allergies: Does not bruise/bleed easily.  Psychiatric/Behavioral: Negative.     As per HPI. Otherwise, a complete review of systems is negatve.  PAST MEDICAL HISTORY: Past Medical History:  Diagnosis Date  . Anemia   . Anxiety   . Asthma   . Chronic back pain   . Depression   . Depression   . GERD (gastroesophageal reflux disease)   . Hiatal hernia   . Hypercholesteremia   . ITP (idiopathic thrombocytopenic purpura)   . Osteoarthritis   . Osteoporosis   . Pulmonary emboli ([place])   . Restless legs     PAST SURGICAL HISTORY: Past Surgical History:  Procedure Laterality Date  . CARPAL  TUNNEL RELEASE    . ESOPHAGOGASTRODUODENOSCOPY (EGD) WITH PROPOFOL N/A 09/10/2015   Procedure: ESOPHAGOGASTRODUODENOSCOPY (EGD) WITH PROPOFOL;  Surgeon: Lollie Sails, MD;  Location: [facility] ENDOSCOPY;  Service: Endoscopy;  Laterality: N/A;  . LUMBAR [place]    . SPLENECTOMY, PARTIAL    . TOTAL ABDOMINAL HYSTERECTOMY      FAMILY HISTORY Family History  Problem Relation Age of Onset  . Stroke Mother     GYNECOLOGIC HISTORY:  No LMP recorded. Patient is postmenopausal.     ADVANCED DIRECTIVES:    HEALTH MAINTENANCE: Social History  Substance Use Topics  . Smoking status: Never Smoker  . Smokeless tobacco: Never Used  . Alcohol use No     Allergies  Allergen Reactions  . Aspirin     Other reaction(s): Distress (finding)  . Diazepam     Other reaction(s): Itching of Skin sneezing  . Morphine     Other reaction(s): Itching of Skin  . Other     Other reaction(s): Unknown seasonal allergies  . Tetanus Toxoids     Other reaction(s): Localized superficial swelling of skin    Current Outpatient Prescriptions  Medication Sig Dispense Refill  . acetaminophen (TYLENOL) 325 MG tablet Take 2 tablets (650 mg total) by mouth every 6 (six) hours as needed for mild pain (or Fever >/= 101). 100 tablet 0  . albuterol (PROVENTIL HFA;VENTOLIN HFA) 108 (90 BASE) MCG/ACT inhaler Inhale 2 puffs into the lungs every 6 (six) hours as needed for wheezing or shortness of breath.    . ALPRAZolam (XANAX) 0.25 MG tablet TAKE 1 TABLET TWICE DAILY AS NEEDED FOR SLEEP    .  BUDESONIDE-FORMOTEROL FUMARATE IN Inhale 2 puffs into the lungs 2 (two) times daily as needed (for shortness of breath and wheezing.).     Marland Kitchen busPIRone (BUSPAR) 15 MG tablet Take by mouth.    . Calcium Carbonate-Vitamin D (CALCIUM 600+D) 600-400 MG-UNIT per tablet Take 1 tablet by mouth daily.    . citalopram (CELEXA) 40 MG tablet Take 40 mg by mouth daily.    . Cyanocobalamin (RA VITAMIN B-12 TR) 1000 MCG TBCR Take 1,000  mcg by mouth daily.     Marland Kitchen docusate sodium (COLACE) 100 MG capsule Take 1 capsule (100 mg total) by mouth 2 (two) times daily as needed for mild constipation. 10 capsule 0  . donepezil (ARICEPT) 5 MG tablet Take 5 mg by mouth daily.    . fluticasone (FLONASE) 50 MCG/ACT nasal spray Place into the nose.    . gabapentin (NEURONTIN) 300 MG capsule 300mg  orally 4 times a day    . HYDROcodone-acetaminophen (NORCO/VICODIN) 5-325 MG per tablet Reported on 10/17/2015    . levothyroxine (SYNTHROID, LEVOTHROID) 50 MCG tablet Take 50 mcg by mouth daily before breakfast. Take 30 to 60 minutes before breakfast.    . memantine (NAMENDA) 5 MG tablet Take by mouth.    . Multiple Vitamins-Minerals (OCUVITE-LUTEIN PO) Take 1 capsule by mouth daily.     . ondansetron (ZOFRAN) 4 MG tablet Take 1 tablet (4 mg total) by mouth every 6 (six) hours as needed for nausea. 20 tablet 0  . oxybutynin (DITROPAN) 5 MG tablet Take by mouth.    . pantoprazole (PROTONIX) 20 MG tablet Take by mouth.    . pravastatin (PRAVACHOL) 20 MG tablet Take 20 mg by mouth daily.    . ranitidine (ZANTAC) 300 MG tablet Take by mouth.    . senna (SENOKOT) 8.6 MG TABS tablet Take 1 tablet (8.6 mg total) by mouth daily as needed for mild constipation. 120 each 0  . traMADol (ULTRAM) 50 MG tablet Take 50 mg by mouth 3 (three) times daily as needed for moderate pain or severe pain. pain    . venlafaxine XR (EFFEXOR-XR) 37.5 MG 24 hr capsule Take 1 capsule by mouth daily.     . vitamin E (E-400) 400 UNIT capsule Take 400 Units by mouth daily.    Marland Kitchen zolpidem (AMBIEN) 5 MG tablet Take 5 mg by mouth at bedtime as needed for sleep.     No current facility-administered medications for this visit.     OBJECTIVE: BP 133/84 (BP Location: Left Arm, Patient Position: Sitting)   Pulse 78   Temp (!) 95.4 F (35.2 C) (Tympanic)   Resp 16   Ht 5\' 5"  (1.651 m)   Wt 138 lb 14.2 oz (63 kg)   BMI 23.11 kg/m    Body mass index is 23.11 kg/m.    ECOG FS:1 -  Symptomatic but completely ambulatory  General: Well-developed, well-nourished, no acute distress. Eyes: Pink conjunctiva, anicteric sclera. HEENT: Normocephalic, moist mucous membranes, clear oropharnyx. Lungs: Clear to auscultation bilaterally. Heart: Regular rate and rhythm. No rubs, murmurs, or gallops. Musculoskeletal: No edema, cyanosis, or clubbing. Neuro: Alert, answering all questions appropriately. Cranial nerves grossly intact. Skin: No petechiae noted. Bruising noted in various stages of healing. Psych: Normal affect.    LAB RESULTS:  Orders Only on 05/14/2016  Component Date Value Ref Range Status  . WBC 05/14/2016 11.0  3.6 - 11.0 K/uL Final  . RBC 05/14/2016 4.96  3.80 - 5.20 MIL/uL Final  . Hemoglobin 05/14/2016 14.9  12.0 - 16.0 g/dL Final  . HCT 05/14/2016 45.1  35.0 - 47.0 % Final  . MCV 05/14/2016 91.0  80.0 - 100.0 fL Final  . MCH 05/14/2016 30.0  26.0 - 34.0 pg Final  . MCHC 05/14/2016 32.9  32.0 - 36.0 g/dL Final  . RDW 05/14/2016 14.4  11.5 - 14.5 % Final  . Platelets 05/14/2016 163  150 - 400 K/uL Final  . Neutrophils Relative % 05/14/2016 63%  % Final  . Neutro Abs 05/14/2016 6.9* 1.4 - 6.5 K/uL Final  . Lymphocytes Relative 05/14/2016 25%  % Final  . Lymphs Abs 05/14/2016 2.8  1.0 - 3.6 K/uL Final  . Monocytes Relative 05/14/2016 8%  % Final  . Monocytes Absolute 05/14/2016 0.9  0.2 - 0.9 K/uL Final  . Eosinophils Relative 05/14/2016 3%  % Final  . Eosinophils Absolute 05/14/2016 0.3  0 - 0.7 K/uL Final  . Basophils Relative 05/14/2016 1%  % Final  . Basophils Absolute 05/14/2016 0.1  0 - 0.1 K/uL Final    STUDIES: No results found.  ASSESSMENT:  Chronic refractory ITP  PLAN:   1. Chronic refractory ITP: Patient had a poor response to Prednisone, WinRho, and Rituxan. She is status post splenectomy in 2007. Patient last received 3 mcg/kg of Nplate on July 19, [id number]. Her platelet count remains improved and is 163. She does not require an injection  today. Return to clinic in 2 weeks for laboratory work and consideration of Nplate if her platelet count falls below 100. She will then return to clinic in 4 weeks for further evaluation.  Can consider Promacta 12.5mg  at a later date if necessary. Max dose of Nplate is 10 mcg/kg. 2. Nausea: Resolved.  3. Burning with urination: UA and culture pending at time of dictation.  Patient expressed understanding and was in agreement with this plan. She also understands that She can call clinic at any time with any questions, concerns, or complaints.    Lloyd Huger, MD   05/14/2016 1:38 PM

## 2016-05-14 NOTE — Progress Notes (Signed)
No major changes since last visit  

## 2016-05-17 LAB — URINE CULTURE

## 2016-05-20 ENCOUNTER — Other Ambulatory Visit: Payer: Self-pay | Admitting: *Deleted

## 2016-05-28 ENCOUNTER — Telehealth: Payer: Self-pay | Admitting: Pharmacist

## 2016-05-28 ENCOUNTER — Inpatient Hospital Stay: Payer: Commercial Managed Care - HMO

## 2016-05-28 DIAGNOSIS — M810 Age-related osteoporosis without current pathological fracture: Secondary | ICD-10-CM | POA: Diagnosis not present

## 2016-05-28 DIAGNOSIS — R531 Weakness: Secondary | ICD-10-CM | POA: Diagnosis not present

## 2016-05-28 DIAGNOSIS — D693 Immune thrombocytopenic purpura: Secondary | ICD-10-CM

## 2016-05-28 DIAGNOSIS — E7800 Pure hypercholesterolemia, unspecified: Secondary | ICD-10-CM | POA: Diagnosis not present

## 2016-05-28 DIAGNOSIS — K219 Gastro-esophageal reflux disease without esophagitis: Secondary | ICD-10-CM | POA: Diagnosis not present

## 2016-05-28 DIAGNOSIS — R5381 Other malaise: Secondary | ICD-10-CM | POA: Diagnosis not present

## 2016-05-28 DIAGNOSIS — R309 Painful micturition, unspecified: Secondary | ICD-10-CM | POA: Diagnosis not present

## 2016-05-28 DIAGNOSIS — M1990 Unspecified osteoarthritis, unspecified site: Secondary | ICD-10-CM | POA: Diagnosis not present

## 2016-05-28 DIAGNOSIS — R5383 Other fatigue: Secondary | ICD-10-CM | POA: Diagnosis not present

## 2016-05-28 LAB — CBC WITH DIFFERENTIAL/PLATELET
BASOS ABS: 0.1 10*3/uL (ref 0–0.1)
EOS ABS: 0.3 10*3/uL (ref 0–0.7)
Eosinophils Relative: 4 %
HCT: 39.4 % (ref 35.0–47.0)
Hemoglobin: 13.2 g/dL (ref 12.0–16.0)
Lymphocytes Relative: 31 %
Lymphs Abs: 2.5 10*3/uL (ref 1.0–3.6)
MCH: 30.4 pg (ref 26.0–34.0)
MCHC: 33.5 g/dL (ref 32.0–36.0)
MCV: 90.8 fL (ref 80.0–100.0)
Monocytes Absolute: 0.8 10*3/uL (ref 0.2–0.9)
Neutro Abs: 4.5 10*3/uL (ref 1.4–6.5)
PLATELETS: 24 10*3/uL — AB (ref 150–400)
RBC: 4.34 MIL/uL (ref 3.80–5.20)
RDW: 15.1 % — ABNORMAL HIGH (ref 11.5–14.5)
WBC: 8.2 10*3/uL (ref 3.6–11.0)

## 2016-05-28 MED ORDER — ROMIPLOSTIM 250 MCG ~~LOC~~ SOLR
190.0000 ug | Freq: Once | SUBCUTANEOUS | Status: AC
  Administered 2016-05-28: 190 ug via SUBCUTANEOUS
  Filled 2016-05-28: qty 0.38

## 2016-05-28 NOTE — Telephone Encounter (Signed)
MD wants to keep Nplate dose at 60mcg/kg

## 2016-05-28 NOTE — Progress Notes (Signed)
Call report from lab of platelet results 24.  Dr. Grayland Ormond informed, v/o patient to keep appt on 06/11/16.  Patient aware.

## 2016-05-28 NOTE — Progress Notes (Signed)
Plts are 24 today. Per dosing guidelines Nplate should be increased by 1mg /kg. Current dose is 3mg /kg.   Called to clarify with MD. MD would like to maintain current dose of 3mg /kg at this time.

## 2016-06-01 DIAGNOSIS — N390 Urinary tract infection, site not specified: Secondary | ICD-10-CM | POA: Diagnosis not present

## 2016-06-10 NOTE — Progress Notes (Signed)
[place]  Telephone:(336) [phone]  Fax:(336) [phone]     Ariana [name] DOB: [birth year]  MR#: [id number]  [id number]  Patient Care Team: Idelle Crouch, MD as PCP - General (Internal Medicine) Serena Colonel, RN as [place] Management  CHIEF COMPLAINT: Chronic refractory ITP  INTERVAL HISTORY:  Patient returns to clinic today for repeat laboratory work and consideration of additional Nplate. She currently feels well and is asymptomatic. She has chronic weakness and fatigue that is unchanged.  She does not complain of easy bruising. She has no neurologic complaints. She denies any fevers. She has no chest pain or shortness of breath. She denies any nausea, vomiting, constipation, or diarrhea. She has no urinary complaints. Patient offers no further specific complaints today.  REVIEW OF SYSTEMS:   Review of Systems  Constitutional: Positive for malaise/fatigue. Negative for fever and weight loss.  Respiratory: Negative.  Negative for shortness of breath.   Cardiovascular: Negative.  Negative for chest pain.  Gastrointestinal: Negative for abdominal pain, blood in stool, diarrhea, melena and nausea.  Genitourinary: Negative for dysuria and hematuria.  Musculoskeletal: Negative.   Skin: Negative for rash.  Neurological: Positive for weakness.  Endo/Heme/Allergies: Does not bruise/bleed easily.  Psychiatric/Behavioral: Negative.  The patient is not nervous/anxious.     As per HPI. Otherwise, a complete review of systems is negatve.  PAST MEDICAL HISTORY: Past Medical History:  Diagnosis Date  . Anemia   . Anxiety   . Asthma   . Chronic back pain   . Depression   . Depression   . GERD (gastroesophageal reflux disease)   . Hiatal hernia   . Hypercholesteremia   . ITP (idiopathic thrombocytopenic purpura)   . Osteoarthritis   . Osteoporosis   . Pulmonary emboli ([place])   . Restless legs     PAST SURGICAL  HISTORY: Past Surgical History:  Procedure Laterality Date  . CARPAL TUNNEL RELEASE    . ESOPHAGOGASTRODUODENOSCOPY (EGD) WITH PROPOFOL N/A 09/10/2015   Procedure: ESOPHAGOGASTRODUODENOSCOPY (EGD) WITH PROPOFOL;  Surgeon: Lollie Sails, MD;  Location: [facility] ENDOSCOPY;  Service: Endoscopy;  Laterality: N/A;  . LUMBAR [place]    . SPLENECTOMY, PARTIAL    . TOTAL ABDOMINAL HYSTERECTOMY      FAMILY HISTORY Family History  Problem Relation Age of Onset  . Stroke Mother     GYNECOLOGIC HISTORY:  No LMP recorded. Patient is postmenopausal.     ADVANCED DIRECTIVES:    HEALTH MAINTENANCE: Social History  Substance Use Topics  . Smoking status: Never Smoker  . Smokeless tobacco: Never Used  . Alcohol use No     Allergies  Allergen Reactions  . Aspirin     Other reaction(s): Distress (finding)  . Diazepam     Other reaction(s): Itching of Skin sneezing  . Morphine     Other reaction(s): Itching of Skin  . Other     Other reaction(s): Unknown seasonal allergies  . Tetanus Toxoids     Other reaction(s): Localized superficial swelling of skin    Current Outpatient Prescriptions  Medication Sig Dispense Refill  . acetaminophen (TYLENOL) 325 MG tablet Take 2 tablets (650 mg total) by mouth every 6 (six) hours as needed for mild pain (or Fever >/= 101). 100 tablet 0  . albuterol (PROVENTIL HFA;VENTOLIN HFA) 108 (90 BASE) MCG/ACT inhaler Inhale 2 puffs into the lungs every 6 (six) hours as needed for wheezing or shortness of breath.    . ALPRAZolam (XANAX) 0.25 MG  tablet TAKE 1 TABLET TWICE DAILY AS NEEDED FOR SLEEP    . BUDESONIDE-FORMOTEROL FUMARATE IN Inhale 2 puffs into the lungs 2 (two) times daily as needed (for shortness of breath and wheezing.).     Marland Kitchen busPIRone (BUSPAR) 15 MG tablet Take by mouth.    . Calcium Carbonate-Vitamin D (CALCIUM 600+D) 600-400 MG-UNIT per tablet Take 1 tablet by mouth daily.    . citalopram (CELEXA) 40 MG tablet Take 40 mg by mouth  daily.    . Cyanocobalamin (RA VITAMIN B-12 TR) 1000 MCG TBCR Take 1,000 mcg by mouth daily.     Marland Kitchen docusate sodium (COLACE) 100 MG capsule Take 1 capsule (100 mg total) by mouth 2 (two) times daily as needed for mild constipation. 10 capsule 0  . donepezil (ARICEPT) 5 MG tablet Take 5 mg by mouth daily.    . fluticasone (FLONASE) 50 MCG/ACT nasal spray Place into the nose.    . gabapentin (NEURONTIN) 300 MG capsule 300mg  orally 4 times a day    . HYDROcodone-acetaminophen (NORCO/VICODIN) 5-325 MG per tablet Reported on 10/17/2015    . levothyroxine (SYNTHROID, LEVOTHROID) 50 MCG tablet Take 50 mcg by mouth daily before breakfast. Take 30 to 60 minutes before breakfast.    . memantine (NAMENDA) 5 MG tablet Take by mouth.    . Multiple Vitamins-Minerals (OCUVITE-LUTEIN PO) Take 1 capsule by mouth daily.     . ondansetron (ZOFRAN) 4 MG tablet Take 1 tablet (4 mg total) by mouth every 6 (six) hours as needed for nausea. 20 tablet 0  . oxybutynin (DITROPAN) 5 MG tablet Take by mouth.    . pantoprazole (PROTONIX) 20 MG tablet Take by mouth.    . pravastatin (PRAVACHOL) 20 MG tablet Take 20 mg by mouth daily.    . ranitidine (ZANTAC) 300 MG tablet Take by mouth.    . senna (SENOKOT) 8.6 MG TABS tablet Take 1 tablet (8.6 mg total) by mouth daily as needed for mild constipation. 120 each 0  . traMADol (ULTRAM) 50 MG tablet Take 50 mg by mouth 3 (three) times daily as needed for moderate pain or severe pain. pain    . venlafaxine XR (EFFEXOR-XR) 37.5 MG 24 hr capsule Take 1 capsule by mouth daily.     . vitamin E (E-400) 400 UNIT capsule Take 400 Units by mouth daily.    Marland Kitchen zolpidem (AMBIEN) 5 MG tablet Take 5 mg by mouth at bedtime as needed for sleep.     No current facility-administered medications for this visit.     OBJECTIVE: BP 123/77 (BP Location: Right Arm, Patient Position: Sitting)   Pulse 72   Temp (!) 96.5 F (35.8 C) (Tympanic)   Resp 18   Wt 139 lb 14.1 oz (63.5 kg)   BMI 23.28 kg/m     Body mass index is 23.28 kg/m.    ECOG FS:1 - Symptomatic but completely ambulatory  General: Well-developed, well-nourished, no acute distress. Eyes: Pink conjunctiva, anicteric sclera. HEENT: Normocephalic, moist mucous membranes, clear oropharnyx. Lungs: Clear to auscultation bilaterally. Heart: Regular rate and rhythm. No rubs, murmurs, or gallops. Musculoskeletal: No edema, cyanosis, or clubbing. Neuro: Alert, answering all questions appropriately. Cranial nerves grossly intact. Skin: No petechiae noted. Bruising noted in various stages of healing. Psych: Normal affect.    LAB RESULTS:  Appointment on 06/11/2016  Component Date Value Ref Range Status  . WBC 06/11/2016 8.3  3.6 - 11.0 K/uL Final  . RBC 06/11/2016 4.48  3.80 - 5.20 MIL/uL  Final  . Hemoglobin 06/11/2016 13.7  12.0 - 16.0 g/dL Final  . HCT 06/11/2016 40.2  35.0 - 47.0 % Final  . MCV 06/11/2016 89.8  80.0 - 100.0 fL Final  . MCH 06/11/2016 30.7  26.0 - 34.0 pg Final  . MCHC 06/11/2016 34.1  32.0 - 36.0 g/dL Final  . RDW 06/11/2016 16.0* 11.5 - 14.5 % Final  . Platelets 06/11/2016 136* 150 - 440 K/uL Final   Comment: PLATELET COUNT CONFIRMED BY SMEAR LARGE PLATELETS PRESENT   . Neutrophils Relative % 06/11/2016 56%  % Final  . Neutro Abs 06/11/2016 4.7  1.4 - 6.5 K/uL Final  . Lymphocytes Relative 06/11/2016 31%  % Final  . Lymphs Abs 06/11/2016 2.6  1.0 - 3.6 K/uL Final  . Monocytes Relative 06/11/2016 9%  % Final  . Monocytes Absolute 06/11/2016 0.8  0.2 - 0.9 K/uL Final  . Eosinophils Relative 06/11/2016 3%  % Final  . Eosinophils Absolute 06/11/2016 0.2  0 - 0.7 K/uL Final  . Basophils Relative 06/11/2016 1%  % Final  . Basophils Absolute 06/11/2016 0.1  0 - 0.1 K/uL Final    STUDIES: No results found.  ASSESSMENT:  Chronic refractory ITP  PLAN:   1. Chronic refractory ITP: Patient had a poor response to Prednisone, WinRho, and Rituxan. She is status post splenectomy in 2007. Patient last  received 3 mcg/kg of Nplate on August 17, QA348G. Her platelet count remains improved and is 136. She does not require an injection today. Return to clinic in 3 weeks for laboratory work, further evaluation, and consideration of Nplate if her platelet count falls below 100. Patient appears to only requiring injections every 3-4 weeks. Can consider Promacta 12.5mg  at a later date if necessary. Max dose of Nplate is 10 mcg/kg. 2. Nausea: Resolved.   Patient expressed understanding and was in agreement with this plan. She also understands that She can call clinic at any time with any questions, concerns, or complaints.    Lloyd Huger, MD   06/16/2016 10:49 AM

## 2016-06-11 ENCOUNTER — Inpatient Hospital Stay: Payer: Commercial Managed Care - HMO

## 2016-06-11 ENCOUNTER — Inpatient Hospital Stay (HOSPITAL_BASED_OUTPATIENT_CLINIC_OR_DEPARTMENT_OTHER): Payer: Commercial Managed Care - HMO | Admitting: Oncology

## 2016-06-11 VITALS — BP 123/77 | HR 72 | Temp 96.5°F | Resp 18 | Wt 139.9 lb

## 2016-06-11 DIAGNOSIS — R5383 Other fatigue: Secondary | ICD-10-CM

## 2016-06-11 DIAGNOSIS — G8929 Other chronic pain: Secondary | ICD-10-CM

## 2016-06-11 DIAGNOSIS — Z79899 Other long term (current) drug therapy: Secondary | ICD-10-CM

## 2016-06-11 DIAGNOSIS — D693 Immune thrombocytopenic purpura: Secondary | ICD-10-CM

## 2016-06-11 DIAGNOSIS — R531 Weakness: Secondary | ICD-10-CM

## 2016-06-11 DIAGNOSIS — K219 Gastro-esophageal reflux disease without esophagitis: Secondary | ICD-10-CM

## 2016-06-11 DIAGNOSIS — M1990 Unspecified osteoarthritis, unspecified site: Secondary | ICD-10-CM

## 2016-06-11 DIAGNOSIS — Z86711 Personal history of pulmonary embolism: Secondary | ICD-10-CM

## 2016-06-11 DIAGNOSIS — R5381 Other malaise: Secondary | ICD-10-CM

## 2016-06-11 DIAGNOSIS — R309 Painful micturition, unspecified: Secondary | ICD-10-CM | POA: Diagnosis not present

## 2016-06-11 DIAGNOSIS — M549 Dorsalgia, unspecified: Secondary | ICD-10-CM

## 2016-06-11 DIAGNOSIS — M810 Age-related osteoporosis without current pathological fracture: Secondary | ICD-10-CM | POA: Diagnosis not present

## 2016-06-11 DIAGNOSIS — E7800 Pure hypercholesterolemia, unspecified: Secondary | ICD-10-CM

## 2016-06-11 LAB — CBC WITH DIFFERENTIAL/PLATELET
Basophils Absolute: 0.1 10*3/uL (ref 0–0.1)
Basophils Relative: 1 %
EOS ABS: 0.2 10*3/uL (ref 0–0.7)
Eosinophils Relative: 3 %
HEMATOCRIT: 40.2 % (ref 35.0–47.0)
HEMOGLOBIN: 13.7 g/dL (ref 12.0–16.0)
LYMPHS ABS: 2.6 10*3/uL (ref 1.0–3.6)
MCH: 30.7 pg (ref 26.0–34.0)
MCHC: 34.1 g/dL (ref 32.0–36.0)
MCV: 89.8 fL (ref 80.0–100.0)
Monocytes Absolute: 0.8 10*3/uL (ref 0.2–0.9)
NEUTROS ABS: 4.7 10*3/uL (ref 1.4–6.5)
Platelets: 136 10*3/uL — ABNORMAL LOW (ref 150–440)
RBC: 4.48 MIL/uL (ref 3.80–5.20)
RDW: 16 % — ABNORMAL HIGH (ref 11.5–14.5)
WBC: 8.3 10*3/uL (ref 3.6–11.0)

## 2016-06-11 NOTE — Progress Notes (Signed)
States continues to feel fatigued since last visit.

## 2016-06-16 DIAGNOSIS — M5416 Radiculopathy, lumbar region: Secondary | ICD-10-CM | POA: Diagnosis not present

## 2016-06-16 DIAGNOSIS — M5136 Other intervertebral disc degeneration, lumbar region: Secondary | ICD-10-CM | POA: Diagnosis not present

## 2016-06-16 DIAGNOSIS — M62830 Muscle spasm of back: Secondary | ICD-10-CM | POA: Diagnosis not present

## 2016-06-18 ENCOUNTER — Other Ambulatory Visit: Payer: Self-pay | Admitting: *Deleted

## 2016-06-18 NOTE — Patient Outreach (Addendum)
[place] [facility]) Care Management  06/18/2016  Thamar Zappone February 01, 1933 [id number]  Subjective: Telephone call to patient's home number, spoke with patient, and HIPAA verified.   Discussed [facility] Care Management services and patient voiced understanding. Patient states she is doing well and requested RNCM call her power of attorney ( son - Birdena Crandall, [phone]) and discussed healthcare needs.   Received verbal authorization for patient to speak with son Birdena Crandall) regarding healthcare needs as needed.    Objective: Per chart review: Patient has not had any recent inpatient hospitalizations or ED visits.   Patient has a history of ITP (idiopathic thrombocytopenic purpura) and hypercholesteremia.  Receives Nplate injections every  3-4 weeks as needed.    Assessment:  Received EMMI Prevent referral on 06/05/16.  Referral reason: low platlets, mild dementia, and needs handrails.   Telephone screen pending, patient's son contact.   Plan: RNCM will call patient's son, per patient's request, for telephone screen completion, within 10 business days, if no return call.   Tacara Hadlock H. Annia Friendly, BSN, [place] Management [facility] Telephonic CM Phone: [phone] Fax: [phone]

## 2016-06-19 ENCOUNTER — Other Ambulatory Visit: Payer: Self-pay | Admitting: *Deleted

## 2016-06-19 NOTE — Patient Outreach (Addendum)
[place] [facility]) Care Management  06/19/2016  Jeralene Mcever [birth year] [id number]   Subjective: Telephone call to patient's son mobile number, spoke with patient and son on speaker phone, HIPAA verified.  Both state they are doing well and appreciative of RNCM's call.   Discussed [facility] Care Management services and both in agreement for patient to receive services.  Completed Depression screening, patient currently on medication for depression and anxiety.   Patient and son states patient has an appointment with primary MD in a few weeks to discuss depression / anxiety medication adjustment and coping strategies.   Son states patient is aware of possible depression / anxiety medication side effects on her memory, continues to  work with MD to address issues as needed and adjust treatment plan.  Both will notify Community RNCM if [place] Management Social Worker referral needed for depression screening follow up.   Patient is also having nail splitting and is planning to discuss with primary at next office visit.  Patient also continues to follow up with Hematologist / Oncologist regarding her ITP treatment every 3 -4 weeks.   Son states patient is afraid of falling and has had  2 -3 falls over the past year with mild injury and did not require ED visit or MD follow up.  Patient is needing assistance with assistive devices to facilitate safety.   Patient in agreement to [place] for care coordination of assistive devices (possible elevated toilet seat, grab bars), home safety evaluation due to fall risk / history, hypertension disease education, hypertension disease monitoring, Advanced Directives education, and depression screening follow up.   Objective: Per chart review: Patient has not had any recent inpatient hospitalizations or ED visits.   Patient has a history of ITP (idiopathic thrombocytopenic purpura) and hypercholesteremia.  Receives Nplate  injections every  3-4 weeks as needed.    Assessment:  Received EMMI Prevent referral on 06/05/16.  Referral reason: low platlets, mild dementia, and needs handrails.   Telephone screen pending completed and patient will be referred to [place] for follow up.   No Telephonic RNCM needs at this time.   Plan: RNCM will refer patient [place] for care coordination of assistive devices (possible elevated toilet seat, grab bars), home safety evaluation due to fall risk / history, hypertension disease education, hypertension disease monitoring, Advanced Directives education, and depression screening follow up.  RNCM will request [place] Management Advanced Directives packet be given to patient at home visit by [facility].   Vedanshi Massaro H. Annia Friendly, BSN, [place] Management [facility] Telephonic CM Phone: [phone] Fax: [phone]

## 2016-06-24 ENCOUNTER — Other Ambulatory Visit: Payer: Self-pay | Admitting: *Deleted

## 2016-06-24 NOTE — Patient Outreach (Signed)
[place] [facility]) Care Management  06/24/2016  Ariana [name] [birth year] [id number]   Received community care coordinator referral ,for patient, Placed call to [name] , HIPAA information verified, patient requested that I speak with her son, Ariana [name]. Placed call to Ariana [name] at number listed [phone], phone rang, but unable to leave a message, recording states voice mail not set up.,   Plan Will await return call if no response will place call in the next 2 business days.   Joylene Draft, RN, [place] Management [phone] Mobile [phone]- Toll Free Main Office

## 2016-06-25 ENCOUNTER — Other Ambulatory Visit: Payer: Self-pay | Admitting: *Deleted

## 2016-06-25 NOTE — Patient Outreach (Signed)
[place] [facility]) Care Management  06/25/2016  Morganna Fujitani [birth year] [id number]   Unsuccessful attempt to contact Mrs.Isensberg's son as she requested on her behalf , no answer and unable to leave a message.   Plan Will plan outreach call to Mrs.[place] or her son Birdena Crandall in the next week.   Joylene Draft, RN, [place] Management [phone]- Mobile [phone]- Toll Free Main Office

## 2016-06-29 ENCOUNTER — Other Ambulatory Visit: Payer: Self-pay | Admitting: *Deleted

## 2016-06-29 NOTE — Patient Outreach (Signed)
[place] [facility]) Care Management  06/29/2016  Gustava Gholar Jul 22, 1933 [id number]   Placed call to Birdena Crandall, son of Mrs.Monter per her request to discuss concerns. HIPAA verified.   Simona Huh voiced concern regarding Mrs.Okerstrom being fatigued a lot and concern related to patient falling as she has fallen at least twice in the last year. He discussed that he has mentioned this to patient PCP also.   Simona Huh discussed that patient gets agitated at times when she has difficulty with memory recalling names or items that she is trying to say.Simona Huh discussed that patient is already on aricept and he states patient has seen Dr.Shah neurologist in the past for her memory problems.     Plan Will schedule home visit for safety evaluation, hypertension education ,and evaluate for other community care coordinator needs and then collaborate for patient centered goals.Joylene Draft, RN, [place] Management [phone]- Mobile [phone]- [place] Office

## 2016-07-01 ENCOUNTER — Encounter: Payer: Self-pay | Admitting: *Deleted

## 2016-07-06 DIAGNOSIS — E7800 Pure hypercholesterolemia, unspecified: Secondary | ICD-10-CM | POA: Diagnosis not present

## 2016-07-06 DIAGNOSIS — Z79899 Other long term (current) drug therapy: Secondary | ICD-10-CM | POA: Diagnosis not present

## 2016-07-06 DIAGNOSIS — R0602 Shortness of breath: Secondary | ICD-10-CM | POA: Diagnosis not present

## 2016-07-06 DIAGNOSIS — D473 Essential (hemorrhagic) thrombocythemia: Secondary | ICD-10-CM | POA: Diagnosis not present

## 2016-07-06 DIAGNOSIS — F419 Anxiety disorder, unspecified: Secondary | ICD-10-CM | POA: Diagnosis not present

## 2016-07-06 DIAGNOSIS — F329 Major depressive disorder, single episode, unspecified: Secondary | ICD-10-CM | POA: Diagnosis not present

## 2016-07-06 DIAGNOSIS — I10 Essential (primary) hypertension: Secondary | ICD-10-CM | POA: Diagnosis not present

## 2016-07-06 DIAGNOSIS — E039 Hypothyroidism, unspecified: Secondary | ICD-10-CM | POA: Diagnosis not present

## 2016-07-08 NOTE — Progress Notes (Signed)
Ariana [name]  Telephone:(336) [phone]  Fax:(336) [phone]     Larin Ariana [name] DOB: Jul 13, 1933  MR#: [id number]  [id number]  Patient Care Team: Idelle Crouch, MD as PCP - General (Internal Medicine) Alfonzo Feller, RN as [place] Management  CHIEF COMPLAINT: Chronic refractory ITP  INTERVAL HISTORY:  Patient returns to clinic today for repeat laboratory work and consideration of additional Nplate. She currently feels well and is asymptomatic. She has chronic weakness and fatigue that is unchanged.  She does not complain of easy bruising. She has no neurologic complaints. She denies any fevers. She has no chest pain or shortness of breath. She denies any nausea, vomiting, constipation, or diarrhea. She has no urinary complaints. Patient offers no further specific complaints today.  REVIEW OF SYSTEMS:   Review of Systems  Constitutional: Positive for malaise/fatigue. Negative for fever and weight loss.  Respiratory: Negative.  Negative for shortness of breath.   Cardiovascular: Negative.  Negative for chest pain.  Gastrointestinal: Negative for abdominal pain, blood in stool, diarrhea, melena and nausea.  Genitourinary: Negative for dysuria and hematuria.  Musculoskeletal: Negative.   Skin: Negative for rash.  Neurological: Positive for weakness.  Endo/Heme/Allergies: Does not bruise/bleed easily.  Psychiatric/Behavioral: Negative.  The patient is not nervous/anxious.     As per HPI. Otherwise, a complete review of systems is negatIve.  PAST MEDICAL HISTORY: Past Medical History:  Diagnosis Date  . Anemia   . Anxiety   . Asthma   . Chronic back pain   . Depression   . Depression   . GERD (gastroesophageal reflux disease)   . Hiatal hernia   . Hypercholesteremia   . ITP (idiopathic thrombocytopenic purpura)   . Osteoarthritis   . Osteoporosis   . Pulmonary emboli ([place])   . Restless legs     PAST SURGICAL  HISTORY: Past Surgical History:  Procedure Laterality Date  . CARPAL TUNNEL RELEASE    . ESOPHAGOGASTRODUODENOSCOPY (EGD) WITH PROPOFOL N/A 09/10/2015   Procedure: ESOPHAGOGASTRODUODENOSCOPY (EGD) WITH PROPOFOL;  Surgeon: Lollie Sails, MD;  Location: [facility] ENDOSCOPY;  Service: Endoscopy;  Laterality: N/A;  . LUMBAR [place]    . SPLENECTOMY, PARTIAL    . TOTAL ABDOMINAL HYSTERECTOMY      FAMILY HISTORY Family History  Problem Relation Age of Onset  . Stroke Mother     GYNECOLOGIC HISTORY:  No LMP recorded. Patient is postmenopausal.     ADVANCED DIRECTIVES:    HEALTH MAINTENANCE: Social History  Substance Use Topics  . Smoking status: Never Smoker  . Smokeless tobacco: Never Used  . Alcohol use No     Allergies  Allergen Reactions  . Aspirin     Other reaction(s): Distress (finding)  . Diazepam     Other reaction(s): Itching of Skin sneezing  . Morphine     Other reaction(s): Itching of Skin  . Other     Other reaction(s): Unknown seasonal allergies  . Tetanus Toxoids     Other reaction(s): Localized superficial swelling of skin    Current Outpatient Prescriptions  Medication Sig Dispense Refill  . acetaminophen (TYLENOL) 325 MG tablet Take 2 tablets (650 mg total) by mouth every 6 (six) hours as needed for mild pain (or Fever >/= 101). 100 tablet 0  . albuterol (PROVENTIL HFA;VENTOLIN HFA) 108 (90 BASE) MCG/ACT inhaler Inhale 2 puffs into the lungs every 6 (six) hours as needed for wheezing or shortness of breath.    . ALPRAZolam (XANAX) 0.25 MG  tablet TAKE 1 TABLET TWICE DAILY AS NEEDED FOR SLEEP    . BUDESONIDE-FORMOTEROL FUMARATE IN Inhale 2 puffs into the lungs 2 (two) times daily as needed (for shortness of breath and wheezing.).     Marland Kitchen busPIRone (BUSPAR) 15 MG tablet Take by mouth.    . Calcium Carbonate-Vitamin D (CALCIUM 600+D) 600-400 MG-UNIT per tablet Take 1 tablet by mouth daily.    . citalopram (CELEXA) 40 MG tablet Take 40 mg by mouth  daily.    . Cyanocobalamin (RA VITAMIN B-12 TR) 1000 MCG TBCR Take 1,000 mcg by mouth daily.     Marland Kitchen docusate sodium (COLACE) 100 MG capsule Take 1 capsule (100 mg total) by mouth 2 (two) times daily as needed for mild constipation. 10 capsule 0  . donepezil (ARICEPT) 5 MG tablet Take 5 mg by mouth daily.    . fluticasone (FLONASE) 50 MCG/ACT nasal spray Place into the nose.    . gabapentin (NEURONTIN) 300 MG capsule 300mg  orally 4 times a day    . HYDROcodone-acetaminophen (NORCO/VICODIN) 5-325 MG per tablet Reported on 10/17/2015    . levothyroxine (SYNTHROID, LEVOTHROID) 50 MCG tablet Take 50 mcg by mouth daily before breakfast. Take 30 to 60 minutes before breakfast.    . memantine (NAMENDA) 5 MG tablet Take by mouth.    . Multiple Vitamins-Minerals (OCUVITE-LUTEIN PO) Take 1 capsule by mouth daily.     . ondansetron (ZOFRAN) 4 MG tablet Take 1 tablet (4 mg total) by mouth every 6 (six) hours as needed for nausea. 20 tablet 0  . oxybutynin (DITROPAN) 5 MG tablet Take by mouth.    . pantoprazole (PROTONIX) 20 MG tablet Take by mouth.    . pravastatin (PRAVACHOL) 20 MG tablet Take 20 mg by mouth daily.    . ranitidine (ZANTAC) 300 MG tablet Take by mouth.    . senna (SENOKOT) 8.6 MG TABS tablet Take 1 tablet (8.6 mg total) by mouth daily as needed for mild constipation. 120 each 0  . traMADol (ULTRAM) 50 MG tablet Take 50 mg by mouth 3 (three) times daily as needed for moderate pain or severe pain. pain    . venlafaxine XR (EFFEXOR-XR) 37.5 MG 24 hr capsule Take 1 capsule by mouth daily.     . vitamin E (E-400) 400 UNIT capsule Take 400 Units by mouth daily.    Marland Kitchen zolpidem (AMBIEN) 5 MG tablet Take 5 mg by mouth at bedtime as needed for sleep.     No current facility-administered medications for this visit.     OBJECTIVE: BP 123/77 (BP Location: Right Arm, Patient Position: Sitting)   Pulse (!) 56   Temp (!) 96 F (35.6 C) (Tympanic)   Resp 18   Wt 140 lb 14 oz (63.9 kg)   BMI 23.44 kg/m     Body mass index is 23.44 kg/m.    ECOG FS:1 - Symptomatic but completely ambulatory  General: Well-developed, well-nourished, no acute distress. Eyes: Pink conjunctiva, anicteric sclera. HEENT: Normocephalic, moist mucous membranes, clear oropharnyx. Lungs: Clear to auscultation bilaterally. Heart: Regular rate and rhythm. No rubs, murmurs, or gallops. Musculoskeletal: No edema, cyanosis, or clubbing. Neuro: Alert, answering all questions appropriately. Cranial nerves grossly intact. Skin: No petechiae noted. Bruising noted in various stages of healing. Psych: Normal affect.    LAB RESULTS:  Appointment on 07/09/2016  Component Date Value Ref Range Status  . WBC 07/09/2016 10.4  3.6 - 11.0 K/uL Final  . RBC 07/09/2016 4.41  3.80 - 5.20  MIL/uL Final  . Hemoglobin 07/09/2016 13.4  12.0 - 16.0 g/dL Final  . HCT 07/09/2016 40.0  35.0 - 47.0 % Final  . MCV 07/09/2016 90.7  80.0 - 100.0 fL Final  . MCH 07/09/2016 30.4  26.0 - 34.0 pg Final  . MCHC 07/09/2016 33.6  32.0 - 36.0 g/dL Final  . RDW 07/09/2016 16.2* 11.5 - 14.5 % Final  . Platelets 07/09/2016 79* 150 - 440 K/uL Final   Comment: PLATELET COUNT CONFIRMED BY SMEAR GIANT PLATELETS SEEN PLATELETS VARIED IN SIZE   . Neutrophils Relative % 07/09/2016 68  % Final  . Neutro Abs 07/09/2016 7.0* 1.4 - 6.5 K/uL Final  . Lymphocytes Relative 07/09/2016 21  % Final  . Lymphs Abs 07/09/2016 2.2  1.0 - 3.6 K/uL Final  . Monocytes Relative 07/09/2016 7  % Final  . Monocytes Absolute 07/09/2016 0.8  0.2 - 0.9 K/uL Final  . Eosinophils Relative 07/09/2016 3  % Final  . Eosinophils Absolute 07/09/2016 0.3  0 - 0.7 K/uL Final  . Basophils Relative 07/09/2016 1  % Final  . Basophils Absolute 07/09/2016 0.1  0 - 0.1 K/uL Final    STUDIES: No results found.  ASSESSMENT:  Chronic refractory ITP  PLAN:   1. Chronic refractory ITP: Patient had a poor response to Prednisone, WinRho, and Rituxan. She is status post splenectomy in 2007.  Patient last received 3 mcg/kg of Nplate on August 17, QA348G. Her platelet count remains improved, But has trended down slightly therefore will proceed with 3 mcg/kg of Nplate today. Return to clinic in 4 weeks for laboratory work, further evaluation, and consideration of Nplate if her platelet count falls below 100. Patient appears to only requiring injections every 3-4 weeks. Can consider Promacta 12.5mg  at a later date if necessary. Max dose of Nplate is 10 mcg/kg. 2. Nausea: Resolved.   Patient expressed understanding and was in agreement with this plan. She also understands that She can call clinic at any time with any questions, concerns, or complaints.    Lloyd Huger, MD   07/12/2016 8:56 AM

## 2016-07-09 ENCOUNTER — Inpatient Hospital Stay: Payer: Commercial Managed Care - HMO

## 2016-07-09 ENCOUNTER — Inpatient Hospital Stay: Payer: Commercial Managed Care - HMO | Attending: Oncology

## 2016-07-09 ENCOUNTER — Inpatient Hospital Stay (HOSPITAL_BASED_OUTPATIENT_CLINIC_OR_DEPARTMENT_OTHER): Payer: Commercial Managed Care - HMO | Admitting: Oncology

## 2016-07-09 VITALS — BP 123/77 | HR 56 | Temp 96.0°F | Resp 18 | Wt 140.9 lb

## 2016-07-09 DIAGNOSIS — Z9081 Acquired absence of spleen: Secondary | ICD-10-CM | POA: Diagnosis not present

## 2016-07-09 DIAGNOSIS — G8929 Other chronic pain: Secondary | ICD-10-CM

## 2016-07-09 DIAGNOSIS — R5382 Chronic fatigue, unspecified: Secondary | ICD-10-CM | POA: Diagnosis not present

## 2016-07-09 DIAGNOSIS — K219 Gastro-esophageal reflux disease without esophagitis: Secondary | ICD-10-CM

## 2016-07-09 DIAGNOSIS — E7800 Pure hypercholesterolemia, unspecified: Secondary | ICD-10-CM | POA: Insufficient documentation

## 2016-07-09 DIAGNOSIS — Z86711 Personal history of pulmonary embolism: Secondary | ICD-10-CM | POA: Diagnosis not present

## 2016-07-09 DIAGNOSIS — G2581 Restless legs syndrome: Secondary | ICD-10-CM

## 2016-07-09 DIAGNOSIS — R5381 Other malaise: Secondary | ICD-10-CM | POA: Insufficient documentation

## 2016-07-09 DIAGNOSIS — D693 Immune thrombocytopenic purpura: Secondary | ICD-10-CM | POA: Diagnosis not present

## 2016-07-09 DIAGNOSIS — R531 Weakness: Secondary | ICD-10-CM

## 2016-07-09 DIAGNOSIS — M549 Dorsalgia, unspecified: Secondary | ICD-10-CM | POA: Diagnosis not present

## 2016-07-09 DIAGNOSIS — Z79899 Other long term (current) drug therapy: Secondary | ICD-10-CM | POA: Insufficient documentation

## 2016-07-09 DIAGNOSIS — M1990 Unspecified osteoarthritis, unspecified site: Secondary | ICD-10-CM | POA: Diagnosis not present

## 2016-07-09 LAB — CBC WITH DIFFERENTIAL/PLATELET
BASOS PCT: 1 %
Basophils Absolute: 0.1 10*3/uL (ref 0–0.1)
EOS ABS: 0.3 10*3/uL (ref 0–0.7)
Eosinophils Relative: 3 %
HCT: 40 % (ref 35.0–47.0)
Hemoglobin: 13.4 g/dL (ref 12.0–16.0)
Lymphocytes Relative: 21 %
Lymphs Abs: 2.2 10*3/uL (ref 1.0–3.6)
MCH: 30.4 pg (ref 26.0–34.0)
MCHC: 33.6 g/dL (ref 32.0–36.0)
MCV: 90.7 fL (ref 80.0–100.0)
MONO ABS: 0.8 10*3/uL (ref 0.2–0.9)
MONOS PCT: 7 %
Neutro Abs: 7 10*3/uL — ABNORMAL HIGH (ref 1.4–6.5)
Neutrophils Relative %: 68 %
PLATELETS: 79 10*3/uL — AB (ref 150–440)
RBC: 4.41 MIL/uL (ref 3.80–5.20)
RDW: 16.2 % — AB (ref 11.5–14.5)
WBC: 10.4 10*3/uL (ref 3.6–11.0)

## 2016-07-09 MED ORDER — ROMIPLOSTIM 250 MCG ~~LOC~~ SOLR
3.0000 ug/kg | Freq: Once | SUBCUTANEOUS | Status: AC
  Administered 2016-07-09: 190 ug via SUBCUTANEOUS
  Filled 2016-07-09: qty 0.38

## 2016-07-16 ENCOUNTER — Other Ambulatory Visit: Payer: Self-pay | Admitting: *Deleted

## 2016-07-16 ENCOUNTER — Encounter: Payer: Self-pay | Admitting: *Deleted

## 2016-07-16 NOTE — Patient Outreach (Addendum)
[facility]) Care Management   07/16/2016  Mystic Rodd [birth year] [id number]  Rhyla Heelan Mccraney is an 82 y.o. 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  Mrs.Caisse reports that she walks daily outside, and makes sure she wears good walking shoes. Patient son voice concern regarding patient taking a shower in her garden tub, she does not have grab bars.  Mrs.Teahan discussed diagnosis of dementia and how her memory is not what is used to be. Patient discussed that she is able to use her computer connecting with facebook.    Objective: BP 124/70   Pulse 60   Resp 18   SpO2 98%   Patient ambulating independently in home, and otherwise resting in her rocker recliner,flip flops on .  Review of Systems  Constitutional: Negative.   HENT: Negative.   Eyes: Negative.   Respiratory: Negative.   Cardiovascular: Negative.   Gastrointestinal: Negative.   Genitourinary: Negative.   Musculoskeletal: Positive for back pain.  Skin: Negative.   Neurological: Negative.   Endo/Heme/Allergies: Bruises/bleeds easily.  Psychiatric/Behavioral: Positive for depression and memory loss. Negative for suicidal ideas. The patient is nervous/anxious.     Physical Exam  Constitutional: She is oriented to person, place, and time. She appears well-developed and well-nourished.  Cardiovascular: Normal rate, normal heart sounds and intact distal pulses.   Respiratory: Effort normal and breath sounds normal.  GI: Soft.  Neurological: She is alert and oriented to person, place, and time.  Skin: Skin is warm and dry.     Generalized bruising echhymotic area bilateral legs arms,skin thin.     Encounter Medications:   Outpatient Encounter Prescriptions as of 07/16/2016  Medication Sig Note  . acetaminophen (TYLENOL) 325 MG tablet Take 2 tablets (650  mg total) by mouth every 6 (six) hours as needed for mild pain (or Fever >/= 101).   Marland Kitchen albuterol (PROVENTIL HFA;VENTOLIN HFA) 108 (90 BASE) MCG/ACT inhaler Inhale 2 puffs into the lungs every 6 (six) hours as needed for wheezing or shortness of breath.   . ALPRAZolam (XANAX) 0.25 MG tablet TAKE 1 TABLET TWICE DAILY AS NEEDED FOR SLEEP 09/17/2015: Received from: [facility]  . BUDESONIDE-FORMOTEROL FUMARATE IN Inhale 2 puffs into the lungs 2 (two) times daily as needed (for shortness of breath and wheezing.).  06/29/2015: Patient is not for sure what mg she uses.  . busPIRone (BUSPAR) 15 MG tablet Take by mouth. 08/26/2015: Received from: [place]  . Calcium Carbonate-Vitamin D (CALCIUM 600+D) 600-400 MG-UNIT per tablet Take 1 tablet by mouth daily.   . citalopram (CELEXA) 40 MG tablet Take 40 mg by mouth daily.   . Cyanocobalamin (RA VITAMIN B-12 TR) 1000 MCG TBCR Take 1,000 mcg by mouth daily.  04/12/2015: Received from: [facility]  . docusate sodium (COLACE) 100 MG capsule Take 1 capsule (100 mg total) by mouth 2 (two) times daily as needed for mild constipation.   Marland Kitchen donepezil (ARICEPT) 5 MG tablet Take 5 mg by mouth daily. 10/29/2015: Received from: [place]: Take 5 mg by mouth.  . fluticasone (FLONASE) 50 MCG/ACT nasal spray Place into the nose. 05/14/2016: Received from: [place]: Place 1 spray into both nostrils 2 (two) times daily.  . Fluticasone-Salmeterol (ADVAIR) 250-50 MCG/DOSE AEPB Inhale 1 puff into the lungs 2 (two) times daily.   Marland Kitchen  gabapentin (NEURONTIN) 300 MG capsule 300mg  orally 4 times a day 04/12/2015: Received from: [facility]  . HYDROcodone-acetaminophen (NORCO/VICODIN) 5-325 MG per tablet Reported on 10/17/2015 04/12/2015: Received from: [facility]  . levothyroxine (SYNTHROID, LEVOTHROID) 50 MCG tablet Take 50 mcg by mouth daily before breakfast. Take 30 to 60 minutes before breakfast.    . memantine (NAMENDA) 5 MG tablet Take by mouth. 08/26/2015: Received from: [place]  . Multiple Vitamins-Minerals (OCUVITE-LUTEIN PO) Take 1 capsule by mouth daily.  02/06/2015: Received from: [place]:   . oxybutynin (DITROPAN) 5 MG tablet Take by mouth. 08/26/2015: Received from: [place]  . pantoprazole (PROTONIX) 20 MG tablet Take by mouth. 07/29/2015: Received from: [place]  . pravastatin (PRAVACHOL) 20 MG tablet Take 20 mg by mouth daily.   . ranitidine (ZANTAC) 300 MG tablet Take by mouth. 11/26/2015: Received from: [place]  . senna (SENOKOT) 8.6 MG TABS tablet Take 1 tablet (8.6 mg total) by mouth daily as needed for mild constipation.   . traMADol (ULTRAM) 50 MG tablet Take 50 mg by mouth 3 (three) times daily as needed for moderate pain or severe pain. pain 02/06/2015: Received from: [place]:   . vitamin C (ASCORBIC ACID) 500 MG tablet Take 500 mg by mouth daily.   . vitamin E (E-400) 400 UNIT capsule Take 400 Units by mouth daily. 06/29/2015: Received from: [place]: Take by mouth.  . zolpidem (AMBIEN) 5 MG tablet Take 5 mg by mouth at bedtime as needed for sleep.   Marland Kitchen ondansetron (ZOFRAN) 4 MG tablet Take 1 tablet (4 mg total) by mouth every 6 (six) hours as needed for nausea. (Patient not taking: Reported on 07/16/2016)   . venlafaxine XR (EFFEXOR-XR) 37.5 MG 24 hr capsule Take 1 capsule by mouth daily.  02/06/2015: Received from: [place]:    No facility-administered encounter medications on file as of 07/16/2016.     Functional Status:   In your present state of health, do you have any difficulty performing the following activities: 07/16/2016  Hearing? N  Vision? N  Difficulty concentrating or making decisions? Y  Walking or climbing stairs? Y  Dressing or bathing?  N  Doing errands, shopping? Y  Preparing Food and eating ? Y  Using the Toilet? N  In the past six months, have you accidently leaked urine? Y  Do you have problems with loss of bowel control? N  Managing your Medications? Y  Managing your Finances? Y  Housekeeping or managing your Housekeeping? Y  Some recent data might be hidden    Fall/Depression Screening:    PHQ 2/9 Scores 07/16/2016 06/19/2016  PHQ - 2 Score 1 4  PHQ- 9 Score - 16   Fall Risk  07/16/2016 06/19/2016  Falls in the past year? Yes Yes  Number falls in past yr: 1 2 or more  Injury with Fall? Yes No  Risk Factor Category  High Fall Risk High Fall Risk  Risk for fall due to : History of fall(s);Impaired balance/gait History of fall(s)  Follow up Falls evaluation completed;Falls prevention discussed Education provided    Assessment:  Routine home visit, with her son Rayford Halsted present. Mrs.Isenbergs husband manages her medications, filling a weekly pill organizer.  Fall Risk - Positive for high fall risk, benefit from continued education on fall prevention strategies, noted scatter rugs in the home  and needing grab bars in bathroom to increase safety, patient has step up into garden tub,and prefers to use tub in her room rather walk in shower.    Dementia-  Patient is able to discuss diagnosis, reports that she is not interested in any type of daily senior care programs, she and son are interested in resources for dementia education and support. Patient's son plans to make a follow up appointment with neurologist for further recommendations .  Hypertension - patient reports her blood pressure is controlled and she is not on medications. Balanced low salt diet education reviewed.     Plan:  Will plan next home visit in month of November Will provide educational information to patient's son Rayford Halsted , [email or web address] prior to next visit.  Will collaborate with LCSW regarding Dementia  resources. Reviewed and provided EMMI handouts on hypertension, high low salt foods.   [facility] CM Care Plan Problem One   Flowsheet Row Most Recent Value  Care Plan Problem One  Knowledge deficit related to [facility] self care managment   Role Documenting the Problem One  Care Management Coordinator  Care Plan for Problem One  Active  THN Long Term Goal (31-90 days)  Patient will be able to identify measures to promote increased knowledge to provide a safe environment   the next 31 days    THN Long Term Goal Start Date  07/16/16  Interventions for Problem One Long Term Goal  Discussed importance of taking medications as prescribed, keeping all MD appointments, and notifying MD sooner of new concerns   THN CM Short Term Goal #1 (0-30 days)  Patient will continue to walk daily in safe environment the next 30 days   THN CM Short Term Goal #1 Start Date  07/16/16  Interventions for Short Term Goal #1  Discussed with patient and son, importance of patient communicating with her husband or son when she is walking , take her cell phone with her , wear supportive shoes.   THN CM Short Term Goal #2 (0-30 days)  Patient/caregiver will be able to install grab bars at tub area in the next 30 days   THN CM Short Term Goal #2 Start Date  07/16/16  Interventions for Short Term Goal #2  Home safety evaluations, reviewed EMMI preventing falls handout, and [facility] handout,       Joylene Draft, RN, [facility] [phone]- Mobile [phone]- [place] Office

## 2016-08-04 ENCOUNTER — Telehealth: Payer: Self-pay | Admitting: *Deleted

## 2016-08-04 ENCOUNTER — Inpatient Hospital Stay: Payer: Commercial Managed Care - HMO | Attending: Oncology

## 2016-08-04 DIAGNOSIS — Z79899 Other long term (current) drug therapy: Secondary | ICD-10-CM | POA: Insufficient documentation

## 2016-08-04 DIAGNOSIS — Z9081 Acquired absence of spleen: Secondary | ICD-10-CM | POA: Diagnosis not present

## 2016-08-04 DIAGNOSIS — Z86711 Personal history of pulmonary embolism: Secondary | ICD-10-CM | POA: Insufficient documentation

## 2016-08-04 DIAGNOSIS — M549 Dorsalgia, unspecified: Secondary | ICD-10-CM | POA: Insufficient documentation

## 2016-08-04 DIAGNOSIS — G2581 Restless legs syndrome: Secondary | ICD-10-CM | POA: Insufficient documentation

## 2016-08-04 DIAGNOSIS — D693 Immune thrombocytopenic purpura: Secondary | ICD-10-CM | POA: Diagnosis not present

## 2016-08-04 DIAGNOSIS — R5382 Chronic fatigue, unspecified: Secondary | ICD-10-CM | POA: Diagnosis not present

## 2016-08-04 DIAGNOSIS — G8929 Other chronic pain: Secondary | ICD-10-CM | POA: Insufficient documentation

## 2016-08-04 DIAGNOSIS — E7800 Pure hypercholesterolemia, unspecified: Secondary | ICD-10-CM | POA: Insufficient documentation

## 2016-08-04 DIAGNOSIS — M1990 Unspecified osteoarthritis, unspecified site: Secondary | ICD-10-CM | POA: Diagnosis not present

## 2016-08-04 DIAGNOSIS — R531 Weakness: Secondary | ICD-10-CM | POA: Insufficient documentation

## 2016-08-04 DIAGNOSIS — R5381 Other malaise: Secondary | ICD-10-CM | POA: Insufficient documentation

## 2016-08-04 DIAGNOSIS — K219 Gastro-esophageal reflux disease without esophagitis: Secondary | ICD-10-CM | POA: Diagnosis not present

## 2016-08-04 LAB — CBC WITH DIFFERENTIAL/PLATELET
BASOS ABS: 0.1 10*3/uL (ref 0–0.1)
BASOS PCT: 1 %
Eosinophils Absolute: 0.1 10*3/uL (ref 0–0.7)
Eosinophils Relative: 2 %
HEMATOCRIT: 41 % (ref 35.0–47.0)
HEMOGLOBIN: 13.4 g/dL (ref 12.0–16.0)
Lymphocytes Relative: 30 %
Lymphs Abs: 2.3 10*3/uL (ref 1.0–3.6)
MCH: 29.9 pg (ref 26.0–34.0)
MCHC: 32.7 g/dL (ref 32.0–36.0)
MCV: 91.5 fL (ref 80.0–100.0)
Monocytes Absolute: 0.7 10*3/uL (ref 0.2–0.9)
Monocytes Relative: 10 %
NEUTROS ABS: 4.5 10*3/uL (ref 1.4–6.5)
NEUTROS PCT: 57 %
Platelets: 35 10*3/uL — ABNORMAL LOW (ref 150–440)
RBC: 4.48 MIL/uL (ref 3.80–5.20)
RDW: 15.8 % — ABNORMAL HIGH (ref 11.5–14.5)
WBC: 7.7 10*3/uL (ref 3.6–11.0)

## 2016-08-04 NOTE — Telephone Encounter (Signed)
She is scheduled for Nplate on Thursday. She come tomorrow if she wants.

## 2016-08-04 NOTE — Telephone Encounter (Signed)
Critical results called from lab, platelet count of 15.

## 2016-08-04 NOTE — Telephone Encounter (Signed)
Lab called back, according to differential actual platelet count is 35. Patient is scheduled for MD/Nplate on [id number].

## 2016-08-05 ENCOUNTER — Other Ambulatory Visit: Payer: Commercial Managed Care - HMO

## 2016-08-05 NOTE — Progress Notes (Signed)
[address]  [phone]  [phone]     Ariana [name] DOB: 13-Nov-1932  MR#: [id number]  [id number]  Patient Care Team: Idelle Crouch, MD as PCP - General (Internal Medicine) Alfonzo Feller, RN as [facility] Management  CHIEF COMPLAINT: Chronic refractory ITP  INTERVAL HISTORY:  Patient returns to clinic today for repeat laboratory work and consideration of additional Nplate. She continues to feel well and is asymptomatic. She has chronic weakness and fatigue that is unchanged.  She does not complain of easy bruising. She has no neurologic complaints. She denies any fevers. She has no chest pain or shortness of breath. She denies any nausea, vomiting, constipation, or diarrhea. She has no urinary complaints. Patient offers no further specific complaints today.  REVIEW OF SYSTEMS:   Review of Systems  Constitutional: Positive for malaise/fatigue. Negative for fever and weight loss.  Respiratory: Negative.  Negative for shortness of breath.   Cardiovascular: Negative.  Negative for chest pain.  Gastrointestinal: Negative for abdominal pain, blood in stool, diarrhea, melena and nausea.  Genitourinary: Negative for dysuria and hematuria.  Musculoskeletal: Negative.   Skin: Negative for rash.  Neurological: Positive for weakness.  Endo/Heme/Allergies: Does not bruise/bleed easily.  Psychiatric/Behavioral: Negative.  The patient is not nervous/anxious.     As per HPI. Otherwise, a complete review of systems is negatIve.  PAST MEDICAL HISTORY: Past Medical History:  Diagnosis Date  . Anemia   . Anxiety   . Asthma   . Chronic back pain   . Depression   . Depression   . GERD (gastroesophageal reflux disease)   . Hiatal hernia   . Hypercholesteremia   . ITP (idiopathic thrombocytopenic purpura)   . Osteoarthritis   . Osteoporosis   . Pulmonary emboli ([place])   . Restless legs     PAST SURGICAL  HISTORY: Past Surgical History:  Procedure Laterality Date  . CARPAL TUNNEL RELEASE    . ESOPHAGOGASTRODUODENOSCOPY (EGD) WITH PROPOFOL N/A 09/10/2015   Procedure: ESOPHAGOGASTRODUODENOSCOPY (EGD) WITH PROPOFOL;  Surgeon: Lollie Sails, MD;  Location: [facility] ENDOSCOPY;  Service: Endoscopy;  Laterality: N/A;  . LUMBAR [place]    . SPLENECTOMY, PARTIAL    . TOTAL ABDOMINAL HYSTERECTOMY      FAMILY HISTORY Family History  Problem Relation Age of Onset  . Stroke Mother     GYNECOLOGIC HISTORY:  No LMP recorded. Patient is postmenopausal.     ADVANCED DIRECTIVES:    HEALTH MAINTENANCE: Social History  Substance Use Topics  . Smoking status: Never Smoker  . Smokeless tobacco: Never Used  . Alcohol use No     Allergies  Allergen Reactions  . Aspirin     Other reaction(s): Distress (finding)  . Diazepam     Other reaction(s): Itching of Skin sneezing  . Morphine     Other reaction(s): Itching of Skin  . Other     Other reaction(s): Unknown seasonal allergies  . Tetanus Toxoids     Other reaction(s): Localized superficial swelling of skin    Current Outpatient Prescriptions  Medication Sig Dispense Refill  . acetaminophen (TYLENOL) 325 MG tablet Take 2 tablets (650 mg total) by mouth every 6 (six) hours as needed for mild pain (or Fever >/= 101). 100 tablet 0  . albuterol (PROVENTIL HFA;VENTOLIN HFA) 108 (90 BASE) MCG/ACT inhaler Inhale 2 puffs into the lungs every 6 (six) hours as needed for wheezing or shortness of breath.    . ALPRAZolam (XANAX) 0.25  MG tablet TAKE 1 TABLET TWICE DAILY AS NEEDED FOR SLEEP    . BUDESONIDE-FORMOTEROL FUMARATE IN Inhale 2 puffs into the lungs 2 (two) times daily as needed (for shortness of breath and wheezing.).     Marland Kitchen buPROPion (WELLBUTRIN XL) 150 MG 24 hr tablet Take 1 tablet by mouth daily.    . Calcium Carbonate-Vitamin D (CALCIUM 600+D) 600-400 MG-UNIT per tablet Take 1 tablet by mouth daily.    . citalopram (CELEXA) 40 MG  tablet Take 40 mg by mouth daily.    . Cyanocobalamin (RA VITAMIN B-12 TR) 1000 MCG TBCR Take 1,000 mcg by mouth daily.     Marland Kitchen docusate sodium (COLACE) 100 MG capsule Take 1 capsule (100 mg total) by mouth 2 (two) times daily as needed for mild constipation. 10 capsule 0  . donepezil (ARICEPT) 5 MG tablet Take 5 mg by mouth daily.    . fluticasone (FLONASE) 50 MCG/ACT nasal spray Place into the nose.    Marland Kitchen Fluticasone-Salmeterol (ADVAIR) 250-50 MCG/DOSE AEPB Inhale 1 puff into the lungs 2 (two) times daily.    Marland Kitchen gabapentin (NEURONTIN) 300 MG capsule 300mg  orally 4 times a day    . HYDROcodone-acetaminophen (NORCO/VICODIN) 5-325 MG per tablet Reported on 10/17/2015    . levothyroxine (SYNTHROID, LEVOTHROID) 50 MCG tablet Take 50 mcg by mouth daily before breakfast. Take 30 to 60 minutes before breakfast.    . memantine (NAMENDA) 5 MG tablet Take by mouth.    . Multiple Vitamins-Minerals (OCUVITE-LUTEIN PO) Take 1 capsule by mouth daily.     Marland Kitchen oxybutynin (DITROPAN) 5 MG tablet Take by mouth.    . pantoprazole (PROTONIX) 20 MG tablet Take by mouth.    . pravastatin (PRAVACHOL) 20 MG tablet Take 20 mg by mouth daily.    . ranitidine (ZANTAC) 300 MG tablet Take by mouth.    . senna (SENOKOT) 8.6 MG TABS tablet Take 1 tablet (8.6 mg total) by mouth daily as needed for mild constipation. 120 each 0  . traMADol (ULTRAM) 50 MG tablet Take 50 mg by mouth 3 (three) times daily as needed for moderate pain or severe pain. pain    . venlafaxine XR (EFFEXOR-XR) 37.5 MG 24 hr capsule Take 1 capsule by mouth daily.     . vitamin C (ASCORBIC ACID) 500 MG tablet Take 500 mg by mouth daily.    . vitamin E (E-400) 400 UNIT capsule Take 400 Units by mouth daily.    Marland Kitchen zolpidem (AMBIEN) 5 MG tablet Take 5 mg by mouth at bedtime as needed for sleep.     No current facility-administered medications for this visit.     OBJECTIVE: BP 138/78 (BP Location: Left Arm, Patient Position: Sitting)   Pulse 60   Temp 97.8 F  (36.6 C) (Tympanic)   Resp 18   Wt 142 lb 10.2 oz (64.7 kg)   BMI 23.74 kg/m    Body mass index is 23.74 kg/m.    ECOG FS:1 - Symptomatic but completely ambulatory  General: Well-developed, well-nourished, no acute distress. Eyes: Pink conjunctiva, anicteric sclera. HEENT: Normocephalic, moist mucous membranes, clear oropharnyx. Lungs: Clear to auscultation bilaterally. Heart: Regular rate and rhythm. No rubs, murmurs, or gallops. Musculoskeletal: No edema, cyanosis, or clubbing. Neuro: Alert, answering all questions appropriately. Cranial nerves grossly intact. Skin: No petechiae noted. Bruising noted in various stages of healing. Psych: Normal affect.    LAB RESULTS:  Appointment on 08/04/2016  Component Date Value Ref Range Status  . WBC 08/04/2016 7.7  3.6 - 11.0 K/uL Final  . RBC 08/04/2016 4.48  3.80 - 5.20 MIL/uL Final  . Hemoglobin 08/04/2016 13.4  12.0 - 16.0 g/dL Final  . HCT 08/04/2016 41.0  35.0 - 47.0 % Final  . MCV 08/04/2016 91.5  80.0 - 100.0 fL Final  . MCH 08/04/2016 29.9  26.0 - 34.0 pg Final  . MCHC 08/04/2016 32.7  32.0 - 36.0 g/dL Final  . RDW 08/04/2016 15.8* 11.5 - 14.5 % Final  . Platelets 08/04/2016 35* 150 - 440 K/uL Final   Comment: PLATELET COUNT CONFIRMED BY SMEAR LARGE PLATELETS PRESENT GIANT PLATELETS SEEN   . Neutrophils Relative % 08/04/2016 57  % Final  . Neutro Abs 08/04/2016 4.5  1.4 - 6.5 K/uL Final  . Lymphocytes Relative 08/04/2016 30  % Final  . Lymphs Abs 08/04/2016 2.3  1.0 - 3.6 K/uL Final  . Monocytes Relative 08/04/2016 10  % Final  . Monocytes Absolute 08/04/2016 0.7  0.2 - 0.9 K/uL Final  . Eosinophils Relative 08/04/2016 2  % Final  . Eosinophils Absolute 08/04/2016 0.1  0 - 0.7 K/uL Final  . Basophils Relative 08/04/2016 1  % Final  . Basophils Absolute 08/04/2016 0.1  0 - 0.1 K/uL Final    STUDIES: No results found.  ASSESSMENT:  Chronic refractory ITP  PLAN:   1. Chronic refractory ITP: Patient had a poor  response to Prednisone, WinRho, and Rituxan. She is status post splenectomy in 2007. Patient last received 3 mcg/kg of Nplate on July 09, 2016. Her platelet count has trended down slightly therefore will proceed with 3 mcg/kg of Nplate today. Return to clinic in 1 month for laboratory work and consideration of Nplate and then in 2 months for laboratory work, further evaluation, and consideration of Nplate if her platelet count falls below 100. Patient appears to only requiring injections every 3-4 weeks. Can consider Promacta 12.5mg  at a later date if necessary. Max dose of Nplate is 10 mcg/kg. 2. Nausea: Resolved.   Patient expressed understanding and was in agreement with this plan. She also understands that She can call clinic at any time with any questions, concerns, or complaints.    Lloyd Huger, MD   08/10/2016 10:18 AM

## 2016-08-06 ENCOUNTER — Inpatient Hospital Stay (HOSPITAL_BASED_OUTPATIENT_CLINIC_OR_DEPARTMENT_OTHER): Payer: Commercial Managed Care - HMO | Admitting: Oncology

## 2016-08-06 ENCOUNTER — Inpatient Hospital Stay: Payer: Commercial Managed Care - HMO

## 2016-08-06 VITALS — BP 138/78 | HR 60 | Temp 97.8°F | Resp 18 | Wt 142.6 lb

## 2016-08-06 DIAGNOSIS — G8929 Other chronic pain: Secondary | ICD-10-CM | POA: Diagnosis not present

## 2016-08-06 DIAGNOSIS — M1990 Unspecified osteoarthritis, unspecified site: Secondary | ICD-10-CM

## 2016-08-06 DIAGNOSIS — D693 Immune thrombocytopenic purpura: Secondary | ICD-10-CM

## 2016-08-06 DIAGNOSIS — G2581 Restless legs syndrome: Secondary | ICD-10-CM

## 2016-08-06 DIAGNOSIS — Z79899 Other long term (current) drug therapy: Secondary | ICD-10-CM

## 2016-08-06 DIAGNOSIS — E7800 Pure hypercholesterolemia, unspecified: Secondary | ICD-10-CM

## 2016-08-06 DIAGNOSIS — K219 Gastro-esophageal reflux disease without esophagitis: Secondary | ICD-10-CM | POA: Diagnosis not present

## 2016-08-06 DIAGNOSIS — M549 Dorsalgia, unspecified: Secondary | ICD-10-CM

## 2016-08-06 DIAGNOSIS — Z86711 Personal history of pulmonary embolism: Secondary | ICD-10-CM

## 2016-08-06 DIAGNOSIS — R531 Weakness: Secondary | ICD-10-CM

## 2016-08-06 DIAGNOSIS — Z9081 Acquired absence of spleen: Secondary | ICD-10-CM

## 2016-08-06 DIAGNOSIS — R5382 Chronic fatigue, unspecified: Secondary | ICD-10-CM | POA: Diagnosis not present

## 2016-08-06 DIAGNOSIS — R5381 Other malaise: Secondary | ICD-10-CM | POA: Diagnosis not present

## 2016-08-06 MED ORDER — ROMIPLOSTIM 250 MCG ~~LOC~~ SOLR
190.0000 ug | Freq: Once | SUBCUTANEOUS | Status: AC
  Administered 2016-08-06: 190 ug via SUBCUTANEOUS
  Filled 2016-08-06: qty 0.38

## 2016-08-06 NOTE — Progress Notes (Signed)
Plts 39. NPlate dose is 72mcg/kg. Called and verified dose with MD. MD would like to continue current dose of 32mcg/kg at this time.

## 2016-08-06 NOTE — Progress Notes (Signed)
States is feeling well. Offers no complaints. 

## 2016-08-13 ENCOUNTER — Ambulatory Visit: Payer: Commercial Managed Care - HMO | Admitting: Oncology

## 2016-08-13 ENCOUNTER — Ambulatory Visit: Payer: Commercial Managed Care - HMO

## 2016-08-17 DIAGNOSIS — R51 Headache: Secondary | ICD-10-CM | POA: Diagnosis not present

## 2016-08-17 DIAGNOSIS — G301 Alzheimer's disease with late onset: Secondary | ICD-10-CM | POA: Diagnosis not present

## 2016-08-17 DIAGNOSIS — F0280 Dementia in other diseases classified elsewhere without behavioral disturbance: Secondary | ICD-10-CM | POA: Diagnosis not present

## 2016-08-18 ENCOUNTER — Ambulatory Visit: Payer: Self-pay | Admitting: *Deleted

## 2016-08-19 ENCOUNTER — Ambulatory Visit: Payer: Commercial Managed Care - HMO | Admitting: *Deleted

## 2016-08-25 ENCOUNTER — Other Ambulatory Visit: Payer: Self-pay | Admitting: *Deleted

## 2016-08-25 NOTE — Patient Outreach (Signed)
[facility]) Care Management   08/25/2016  Ariana [name] 01-12-1933 [id number]  Ariana [name] is an 82 y.o. female  Subjective:  Patient states this a good day for her,lately low energy , discussed how she gets disgusted when she starts to do something such as cooking and forgets ingredients or start to say something and it just doesn't come out right .  Patient discussed recent visit with neurologist complaint of headache, gets relief sometimes with tylenol or hydrocodone .   Patient denies recent fall, report they have purchased hand rails but have not been installed yet.  Patient discussed her daily activity of walking outside at her home with her new dog , staying active with participating in [place], and doing computer games.   Objective:  BP 128/74 (BP Location: Left Arm, Patient Position: Sitting)   Pulse 60   Resp 18   SpO2 98%  Review of Systems  Constitutional: Negative.   HENT: Negative.   Eyes: Negative.   Respiratory: Negative.   Cardiovascular: Negative.   Gastrointestinal: Negative.   Genitourinary: Negative.   Musculoskeletal: Negative.   Skin: Negative.   Neurological: Negative.   Endo/Heme/Allergies: Negative.   Psychiatric/Behavioral: Positive for memory loss.       Forgetfullness    Physical Exam  Constitutional: She is oriented to person, place, and time. She appears well-developed and well-nourished.  Cardiovascular: Normal rate, normal heart sounds and intact distal pulses.   Respiratory: Effort normal.  GI: Soft.  Musculoskeletal: Normal range of motion.  Neurological: She is alert and oriented to person, place, and time.  Skin: Skin is warm and dry.  Psychiatric: She has a normal mood and affect. Her behavior is normal. Judgment and thought content normal.    Encounter Medications:   Outpatient Encounter Prescriptions as of 08/25/2016  Medication Sig Note  . acetaminophen (TYLENOL) 325 MG  tablet Take 2 tablets (650 mg total) by mouth every 6 (six) hours as needed for mild pain (or Fever >/= 101).   Marland Kitchen albuterol (PROVENTIL HFA;VENTOLIN HFA) 108 (90 BASE) MCG/ACT inhaler Inhale 2 puffs into the lungs every 6 (six) hours as needed for wheezing or shortness of breath.   . ALPRAZolam (XANAX) 0.25 MG tablet TAKE 1 TABLET TWICE DAILY AS NEEDED FOR SLEEP 09/17/2015: Received from: [facility]  . BUDESONIDE-FORMOTEROL FUMARATE IN Inhale 2 puffs into the lungs 2 (two) times daily as needed (for shortness of breath and wheezing.).  06/29/2015: Patient is not for sure what mg she uses.  Marland Kitchen buPROPion (WELLBUTRIN XL) 150 MG 24 hr tablet Take 1 tablet by mouth daily. 08/06/2016: Received from: [place]: Take 1 tablet (150 mg total) by mouth once daily.  . Calcium Carbonate-Vitamin D (CALCIUM 600+D) 600-400 MG-UNIT per tablet Take 1 tablet by mouth daily.   . citalopram (CELEXA) 40 MG tablet Take 40 mg by mouth daily.   . Cyanocobalamin (RA VITAMIN B-12 TR) 1000 MCG TBCR Take 1,000 mcg by mouth daily.  04/12/2015: Received from: [facility]  . docusate sodium (COLACE) 100 MG capsule Take 1 capsule (100 mg total) by mouth 2 (two) times daily as needed for mild constipation.   Marland Kitchen donepezil (ARICEPT) 5 MG tablet Take 5 mg by mouth daily. 10/29/2015: Received from: [place]: Take 5 mg by mouth.  . fluticasone (FLONASE) 50 MCG/ACT nasal spray Place into the nose. 05/14/2016: Received from: [place]: Place 1 spray  into both nostrils 2 (two) times daily.  . Fluticasone-Salmeterol (ADVAIR) 250-50 MCG/DOSE AEPB Inhale 1 puff into the lungs 2 (two) times daily.   Marland Kitchen gabapentin (NEURONTIN) 300 MG capsule '300mg'$  orally 4 times a day 04/12/2015: Received from: [facility]  . HYDROcodone-acetaminophen (NORCO/VICODIN) 5-325 MG per tablet Reported on 10/17/2015 04/12/2015: Received from: [facility]  . levothyroxine  (SYNTHROID, LEVOTHROID) 50 MCG tablet Take 50 mcg by mouth daily before breakfast. Take 30 to 60 minutes before breakfast.   . memantine (NAMENDA) 5 MG tablet Take by mouth. 08/26/2015: Received from: [place]  . Multiple Vitamins-Minerals (OCUVITE-LUTEIN PO) Take 1 capsule by mouth daily.  02/06/2015: Received from: [place]:   . pantoprazole (PROTONIX) 20 MG tablet Take by mouth. 07/29/2015: Received from: [place]  . pravastatin (PRAVACHOL) 20 MG tablet Take 20 mg by mouth daily.   . ranitidine (ZANTAC) 300 MG tablet Take by mouth. 11/26/2015: Received from: [place]  . senna (SENOKOT) 8.6 MG TABS tablet Take 1 tablet (8.6 mg total) by mouth daily as needed for mild constipation.   . traMADol (ULTRAM) 50 MG tablet Take 50 mg by mouth 3 (three) times daily as needed for moderate pain or severe pain. pain 02/06/2015: Received from: [place]:   . venlafaxine XR (EFFEXOR-XR) 37.5 MG 24 hr capsule Take 1 capsule by mouth daily.  02/06/2015: Received from: [place]:   . vitamin C (ASCORBIC ACID) 500 MG tablet Take 500 mg by mouth daily.   . vitamin E (E-400) 400 UNIT capsule Take 400 Units by mouth daily. 06/29/2015: Received from: [place]: Take by mouth.  . zolpidem (AMBIEN) 5 MG tablet Take 5 mg by mouth at bedtime as needed for sleep.    No facility-administered encounter medications on file as of 08/25/2016.     Functional Status:   In your present state of health, do you have any difficulty performing the following activities: 07/16/2016  Hearing? N  Vision? N  Difficulty concentrating or making decisions? Y  Walking or climbing stairs? Y  Dressing or bathing? N  Doing errands, shopping? Y  Preparing Food and eating ? Y  Using the Toilet? N  In the past six months, have you accidently leaked urine?  Y  Do you have problems with loss of bowel control? N  Managing your Medications? Y  Managing your Finances? Y  Housekeeping or managing your Housekeeping? Y  Some recent data might be hidden    Fall/Depression Screening:    PHQ 2/9 Scores 07/16/2016 06/19/2016  PHQ - 2 Score 1 4  PHQ- 9 Score - 16   Fall Risk  07/16/2016 06/19/2016  Falls in the past year? Yes Yes  Number falls in past yr: 1 2 or more  Injury with Fall? Yes No  Risk Factor Category  High Fall Risk High Fall Risk  Risk for fall due to : History of fall(s);Impaired balance/gait History of fall(s)  Follow up Falls evaluation completed;Falls prevention discussed Education provided   Assessment:  Routine home visit, patient's husband present.   Safety/ Fall risk-  Husband to install rails in bathroom,   Demential- will benefit from resources for dementia support,  Social - will benefit from continued, social interactions  Activity that she participates in , declines wanting to participate in senior group activities.    Plan:   Provide with information on Dementia  support available through Hospice/palliative care at [facility] home visit in next month  [facility] CM Care Plan Problem One   Flowsheet Row Most Recent Value  Care Plan Problem One  Knowledge deficit related to [facility] self care managment   Role Documenting the Problem One  Care Management [place] for Problem One  Active  THN Long Term Goal (31-90 days)  Patient will be able to identify measures to promote increased knowledge to provide a safe environment   the next 31 days    THN Long Term Goal Start Date  07/16/16  Interventions for Problem One Long Term Goal  Reinforced  importance of taking medications as prescribed, keeping all MD appointments, and notifying MD sooner of new concerns   THN CM Short Term Goal #1 (0-30 days)  Patient will continue to walk daily in safe environment the next 30 days   THN CM Short Term Goal #1 Start Date   07/16/16  Interventions for Short Term Goal #1  Reinforced with patient importance of patient communicating with her husband or son when she is walking , take her cell phone with her , wear supportive shoes.   THN CM Short Term Goal #2 (0-30 days)  Patient/caregiver will be able to install grab bars at tub area in the next 30 days   THN CM Short Term Goal #2 Start Date  08/25/16 [goal date restarted ]  Interventions for Short Term Goal #2  Husband present at visit and  verifies he plans to install bars   THN CM Short Term Goal #3 (0-30 days)  Caregiver will make appointment with neurologist in the next 30 days   THN CM Short Term Goal #3 Start Date  07/16/16  [facility] CM Short Term Goal #3 Met Date  08/25/16  THN CM Short Term Goal #4 (0-30 days)  Patient/caregiver will make contact with Dementia specialist in the next 30 days   THN CM Short Term Goal #4 Start Date  08/25/16  Interventions for Short Term Goal #4  Provided patient with Dementia specialist at [facility] handout and contact number, discussed resource benefit.       Joylene Draft, RN, [place] [facility] [phone]- Mobile [phone]- Toll Free Main Office

## 2016-09-08 ENCOUNTER — Inpatient Hospital Stay: Payer: Commercial Managed Care - HMO | Attending: Oncology

## 2016-09-08 ENCOUNTER — Inpatient Hospital Stay: Payer: Commercial Managed Care - HMO

## 2016-09-08 DIAGNOSIS — Z9081 Acquired absence of spleen: Secondary | ICD-10-CM | POA: Insufficient documentation

## 2016-09-08 DIAGNOSIS — D693 Immune thrombocytopenic purpura: Secondary | ICD-10-CM

## 2016-09-08 LAB — CBC WITH DIFFERENTIAL/PLATELET
BASOS ABS: 0.1 10*3/uL (ref 0–0.1)
Basophils Relative: 1 %
EOS PCT: 3 %
Eosinophils Absolute: 0.2 10*3/uL (ref 0–0.7)
HEMATOCRIT: 38.8 % (ref 35.0–47.0)
Hemoglobin: 12.8 g/dL (ref 12.0–16.0)
LYMPHS PCT: 30 %
Lymphs Abs: 2.6 10*3/uL (ref 1.0–3.6)
MCH: 29.4 pg (ref 26.0–34.0)
MCHC: 32.9 g/dL (ref 32.0–36.0)
MCV: 89.3 fL (ref 80.0–100.0)
MONO ABS: 0.9 10*3/uL (ref 0.2–0.9)
MONOS PCT: 11 %
NEUTROS ABS: 4.7 10*3/uL (ref 1.4–6.5)
Neutrophils Relative %: 55 %
PLATELETS: 15 10*3/uL — AB (ref 150–440)
RBC: 4.35 MIL/uL (ref 3.80–5.20)
RDW: 14.9 % — AB (ref 11.5–14.5)
WBC: 8.5 10*3/uL (ref 3.6–11.0)

## 2016-09-08 MED ORDER — ROMIPLOSTIM 250 MCG ~~LOC~~ SOLR
250.0000 ug | Freq: Once | SUBCUTANEOUS | Status: AC
  Administered 2016-09-08: 250 ug via SUBCUTANEOUS
  Filled 2016-09-08: qty 0.5

## 2016-09-08 NOTE — Progress Notes (Signed)
plt =15.  Called MD, going to increase nplate to 36mcg/kg

## 2016-09-14 DIAGNOSIS — M5136 Other intervertebral disc degeneration, lumbar region: Secondary | ICD-10-CM | POA: Diagnosis not present

## 2016-09-14 DIAGNOSIS — M62830 Muscle spasm of back: Secondary | ICD-10-CM | POA: Diagnosis not present

## 2016-09-14 DIAGNOSIS — M5416 Radiculopathy, lumbar region: Secondary | ICD-10-CM | POA: Diagnosis not present

## 2016-09-14 DIAGNOSIS — D691 Qualitative platelet defects: Secondary | ICD-10-CM | POA: Diagnosis not present

## 2016-09-24 ENCOUNTER — Encounter: Payer: Self-pay | Admitting: *Deleted

## 2016-09-24 ENCOUNTER — Other Ambulatory Visit: Payer: Self-pay | Admitting: *Deleted

## 2016-09-24 NOTE — Patient Outreach (Signed)
Ariana [name]   09/24/2016  Ariana [name] 10-Dec-1932 [id number]  Ariana [name] is an 82 y.o. female  Subjective:  Patient discussed today she feels tired after doing a little bit around the house. Patient reports she has been able to take a shower in her tub, now has hand held shower , denies any falls. Patient's husband has purchased grab bars for tub,now states he  believes it will be difficult for him to install. Patient's husband states shower chair they have is too wide for there tub. Patient states they are a little strapped now and unable to purchase shower chair that will fit in her tub.   Patient states they have not made contact with Dementia specialist due to being so busy , but plans to place call to set up visit in the new year.     Objective:  BP 136/80 (BP Location: Left Arm, Patient Position: Sitting)   Pulse 60   Resp 18   SpO2 98%  Review of Systems  Constitutional: Negative.   HENT: Negative.   Eyes: Negative.   Respiratory: Negative.   Cardiovascular: Negative.   Gastrointestinal: Negative.   Genitourinary: Negative.   Musculoskeletal: Negative.   Skin: Negative.   Neurological: Negative.   Endo/Heme/Allergies: Bruises/bleeds easily.  Psychiatric/Behavioral: Negative.     Physical Exam  Constitutional: She is oriented to person, place, and time. She appears well-developed and well-nourished.  Cardiovascular: Normal rate and normal heart sounds.   Respiratory: Effort normal.  GI: Soft.  Neurological: She is alert and oriented to person, place, and time.  Skin: Skin is warm and dry.  Psychiatric: She has a normal mood and affect. Her behavior is normal. Judgment and thought content normal.    Encounter Medications:   Outpatient Encounter Prescriptions as of 09/24/2016  Medication Sig Note  . acetaminophen (TYLENOL) 325 MG tablet Take 2 tablets (650 mg total) by mouth every 6 (six) hours as  needed for mild pain (or Fever >/= 101).   Marland Kitchen albuterol (PROVENTIL HFA;VENTOLIN HFA) 108 (90 BASE) MCG/ACT inhaler Inhale 2 puffs into the lungs every 6 (six) hours as needed for wheezing or shortness of breath.   . ALPRAZolam (XANAX) 0.25 MG tablet TAKE 1 TABLET TWICE DAILY AS NEEDED FOR SLEEP 09/17/2015: Received from: [facility]  . BUDESONIDE-FORMOTEROL FUMARATE IN Inhale 2 puffs into the lungs 2 (two) times daily as needed (for shortness of breath and wheezing.).  06/29/2015: Patient is not for sure what mg she uses.  Marland Kitchen buPROPion (WELLBUTRIN XL) 150 MG 24 hr tablet Take 1 tablet by mouth daily. 08/06/2016: Received from: [place]: Take 1 tablet (150 mg total) by mouth once daily.  . Calcium Carbonate-Vitamin D (CALCIUM 600+D) 600-400 MG-UNIT per tablet Take 1 tablet by mouth daily.   . citalopram (CELEXA) 40 MG tablet Take 40 mg by mouth daily.   . Cyanocobalamin (RA VITAMIN B-12 TR) 1000 MCG TBCR Take 1,000 mcg by mouth daily.  04/12/2015: Received from: [facility]  . docusate sodium (COLACE) 100 MG capsule Take 1 capsule (100 mg total) by mouth 2 (two) times daily as needed for mild constipation.   Marland Kitchen donepezil (ARICEPT) 5 MG tablet Take 5 mg by mouth daily. 10/29/2015: Received from: [place]: Take 5 mg by mouth.  . fluticasone (FLONASE) 50 MCG/ACT nasal spray Place into the nose. 05/14/2016: Received from: [place]: Place 1 spray into  both nostrils 2 (two) times daily.  . Fluticasone-Salmeterol (ADVAIR) 250-50 MCG/DOSE AEPB Inhale 1 puff into the lungs 2 (two) times daily.   Marland Kitchen gabapentin (NEURONTIN) 300 MG capsule 300mg  orally 4 times a day 04/12/2015: Received from: [facility]  . HYDROcodone-acetaminophen (NORCO/VICODIN) 5-325 MG per tablet Reported on 10/17/2015 04/12/2015: Received from: [facility]  . levothyroxine (SYNTHROID, LEVOTHROID) 50 MCG tablet Take 50 mcg by mouth daily before  breakfast. Take 30 to 60 minutes before breakfast.   . memantine (NAMENDA) 5 MG tablet Take by mouth. 08/26/2015: Received from: [place]  . Multiple Vitamins-Minerals (OCUVITE-LUTEIN PO) Take 1 capsule by mouth daily.  02/06/2015: Received from: [place]:   . pantoprazole (PROTONIX) 20 MG tablet Take by mouth. 07/29/2015: Received from: [place]  . pravastatin (PRAVACHOL) 20 MG tablet Take 20 mg by mouth daily.   . ranitidine (ZANTAC) 300 MG tablet Take by mouth. 11/26/2015: Received from: [place]  . senna (SENOKOT) 8.6 MG TABS tablet Take 1 tablet (8.6 mg total) by mouth daily as needed for mild constipation.   . traMADol (ULTRAM) 50 MG tablet Take 50 mg by mouth 3 (three) times daily as needed for moderate pain or severe pain. pain 02/06/2015: Received from: [place]:   . venlafaxine XR (EFFEXOR-XR) 37.5 MG 24 hr capsule Take 1 capsule by mouth daily.  02/06/2015: Received from: [place]:   . vitamin C (ASCORBIC ACID) 500 MG tablet Take 500 mg by mouth daily.   . vitamin E (E-400) 400 UNIT capsule Take 400 Units by mouth daily. 06/29/2015: Received from: [place]: Take by mouth.  . zolpidem (AMBIEN) 5 MG tablet Take 5 mg by mouth at bedtime as needed for sleep.    No facility-administered encounter medications on file as of 09/24/2016.     Functional Status:   In your present state of health, do you have any difficulty performing the following activities: 07/16/2016  Hearing? N  Vision? N  Difficulty concentrating or making decisions? Y  Walking or climbing stairs? Y  Dressing or bathing? N  Doing errands, shopping? Y  Preparing Food and eating ? Y  Using the Toilet? N  In the past six months, have you accidently leaked urine? Y  Do you have problems with loss of bowel control? N  Managing your  Medications? Y  Managing your Finances? Y  Housekeeping or managing your Housekeeping? Y  Some recent data might be hidden    Fall/Depression Screening:    PHQ 2/9 Scores 07/16/2016 06/19/2016  PHQ - 2 Score 1 4  PHQ- 9 Score - 16    Assessment:   Routine home visit Dementia - will benefit from meeting with Dementia specialist, will assist with contact.   Fall Risk- safety equipment of hand held shower hose added. Need assistance with installing bar outside shower and with getting shower chair.  Patient denies recent falls.   Plan:  Will Discuss with [place] regarding resources for installing safety bar at shower, and possible assistance with shower chair.  Assisted patient with placing call to Dementia specialist,providing patient contact information with  patient and husband contact information .  Will follow up with patient at home visit within the next month, and sooner if information to update regarding shower chair.   Joylene Draft, RN, [name] [phone]- Mobile [phone]- Toll Free Main Office

## 2016-09-28 NOTE — Addendum Note (Signed)
Addended by: Joylene Draft A on: 09/28/2016 08:35 AM   Modules accepted: Orders

## 2016-09-30 ENCOUNTER — Other Ambulatory Visit: Payer: Self-pay | Admitting: *Deleted

## 2016-09-30 NOTE — Patient Outreach (Signed)
[facility]) Care Management  09/30/2016  Ariana [name] Feb 10, 1933 [id number]   Phone call to patient to assist with community resources to install grab bars in their bathroom to avoid falls as well as DME.  HIPPA compliant voicemail message left requesting a return call.   Sheralyn Boatman [facility] Care Management [phone]

## 2016-10-02 ENCOUNTER — Other Ambulatory Visit: Payer: Self-pay | Admitting: *Deleted

## 2016-10-02 NOTE — Patient Outreach (Signed)
[facility]) Care Management  10/02/2016  Ariana [name] 1932-12-03 [id number]   Phone call to patient to discuss DME needs, specifically need for a shower chair and installation of grab bars to avoid falls. HIPPA compliant voicemail message left for a return call.  RNCM has a home visit scheduled for 10/27/16.  This Education officer, museum will plan to accompany RNCM if possible to assess for DME needs.   Sheralyn Boatman [facility] Care Management [phone]

## 2016-10-06 NOTE — Progress Notes (Signed)
[place]  [phone]  [phone]     Ariana [name] DOB: [birth year]  MR#: [id number]  [id number]  Patient Care Team: Idelle Crouch, MD as PCP - General (Internal Medicine) Alfonzo Feller, RN as [place], LCSW as [place] Management  CHIEF COMPLAINT: Chronic refractory ITP  INTERVAL HISTORY:  Patient returns to clinic today for repeat laboratory work and consideration of additional Nplate. She continues to feel well and is asymptomatic. She has chronic weakness and fatigue that is unchanged.  She does not complain of easy bruising. She has no neurologic complaints. She denies any fevers. She has no chest pain or shortness of breath. She denies any nausea, vomiting, constipation, or diarrhea. She has no urinary complaints. Patient offers no further specific complaints today.  REVIEW OF SYSTEMS:   Review of Systems  Constitutional: Positive for malaise/fatigue. Negative for fever and weight loss.  Respiratory: Negative.  Negative for shortness of breath.   Cardiovascular: Negative.  Negative for chest pain.  Gastrointestinal: Negative for abdominal pain, blood in stool, diarrhea, melena and nausea.  Genitourinary: Negative for dysuria and hematuria.  Musculoskeletal: Negative.   Skin: Negative for rash.  Neurological: Positive for weakness.  Endo/Heme/Allergies: Does not bruise/bleed easily.  Psychiatric/Behavioral: Negative.  The patient is not nervous/anxious.     As per HPI. Otherwise, a complete review of systems is negative.  PAST MEDICAL HISTORY: Past Medical History:  Diagnosis Date  . Anemia   . Anxiety   . Asthma   . Chronic back pain   . Depression   . Depression   . GERD (gastroesophageal reflux disease)   . Hiatal hernia   . Hypercholesteremia   . ITP (idiopathic thrombocytopenic purpura)   . Osteoarthritis   . Osteoporosis   .  Pulmonary emboli ([place])   . Restless legs     PAST SURGICAL HISTORY: Past Surgical History:  Procedure Laterality Date  . CARPAL TUNNEL RELEASE    . ESOPHAGOGASTRODUODENOSCOPY (EGD) WITH PROPOFOL N/A 09/10/2015   Procedure: ESOPHAGOGASTRODUODENOSCOPY (EGD) WITH PROPOFOL;  Surgeon: Lollie Sails, MD;  Location[facility] ENDOSCOPY;  Service: Endoscopy;  Laterality: N/A;  . LUMBAR [place]    . SPLENECTOMY, PARTIAL    . TOTAL ABDOMINAL HYSTERECTOMY      FAMILY HISTORY Family History  Problem Relation Age of Onset  . Stroke Mother     GYNECOLOGIC HISTORY:  No LMP recorded. Patient is postmenopausal.     ADVANCED DIRECTIVES:    HEALTH MAINTENANCE: Social History  Substance Use Topics  . Smoking status: Never Smoker  . Smokeless tobacco: Never Used  . Alcohol use No     Allergies  Allergen Reactions  . Aspirin     Other reaction(s): Distress (finding)  . Diazepam     Other reaction(s): Itching of Skin sneezing  . Morphine     Other reaction(s): Itching of Skin  . Other     Other reaction(s): Unknown seasonal allergies  . Tetanus Toxoids     Other reaction(s): Localized superficial swelling of skin    Current Outpatient Prescriptions  Medication Sig Dispense Refill  . acetaminophen (TYLENOL) 325 MG tablet Take 2 tablets (650 mg total) by mouth every 6 (six) hours as needed for mild pain (or Fever >/= 101). 100 tablet 0  . albuterol (PROVENTIL HFA;VENTOLIN HFA) 108 (90 BASE) MCG/ACT inhaler Inhale 2 puffs into the lungs every 6 (six) hours as needed for wheezing or  shortness of breath.    . ALPRAZolam (XANAX) 0.25 MG tablet TAKE 1 TABLET TWICE DAILY AS NEEDED FOR SLEEP    . BUDESONIDE-FORMOTEROL FUMARATE IN Inhale 2 puffs into the lungs 2 (two) times daily as needed (for shortness of breath and wheezing.).     Marland Kitchen buPROPion (WELLBUTRIN XL) 150 MG 24 hr tablet Take 1 tablet by mouth daily.    . Calcium Carbonate-Vitamin D (CALCIUM 600+D) 600-400 MG-UNIT per tablet  Take 1 tablet by mouth daily.    . citalopram (CELEXA) 40 MG tablet Take 40 mg by mouth daily.    . Cyanocobalamin (RA VITAMIN B-12 TR) 1000 MCG TBCR Take 1,000 mcg by mouth daily.     Marland Kitchen donepezil (ARICEPT) 5 MG tablet Take 5 mg by mouth daily.    . Fluticasone-Salmeterol (ADVAIR) 250-50 MCG/DOSE AEPB Inhale 1 puff into the lungs 2 (two) times daily.    Marland Kitchen gabapentin (NEURONTIN) 300 MG capsule 300mg  orally 4 times a day    . [START ON 11/14/2016] HYDROcodone-acetaminophen (NORCO) 7.5-325 MG tablet 1 po tid prn Earliest Fill Date: 11/14/16    . levothyroxine (SYNTHROID, LEVOTHROID) 50 MCG tablet Take 50 mcg by mouth daily before breakfast. Take 30 to 60 minutes before breakfast.    . Multiple Vitamins-Minerals (OCUVITE-LUTEIN PO) Take 1 capsule by mouth daily.     Marland Kitchen oxybutynin (DITROPAN) 5 MG tablet Take by mouth.    . pantoprazole (PROTONIX) 20 MG tablet Take 20 mg by mouth 2 (two) times daily.     . pravastatin (PRAVACHOL) 20 MG tablet Take 20 mg by mouth daily.    . traMADol (ULTRAM) 50 MG tablet Take 50 mg by mouth 3 (three) times daily as needed for moderate pain or severe pain. pain    . vitamin C (ASCORBIC ACID) 500 MG tablet Take 500 mg by mouth daily.    . vitamin E (E-400) 400 UNIT capsule Take 400 Units by mouth daily.    Marland Kitchen zolpidem (AMBIEN) 5 MG tablet Take 5 mg by mouth at bedtime as needed for sleep.    . memantine (NAMENDA) 5 MG tablet Take by mouth.     No current facility-administered medications for this visit.     OBJECTIVE: BP 135/78 (BP Location: Left Arm, Patient Position: Sitting)   Pulse 63   Temp 98.5 F (36.9 C) (Tympanic)   Wt 133 lb 7.8 oz (60.6 kg)   BMI 22.21 kg/m    Body mass index is 22.21 kg/m.    ECOG FS:1 - Symptomatic but completely ambulatory  General: Well-developed, well-nourished, no acute distress. Eyes: Pink conjunctiva, anicteric sclera. HEENT: Normocephalic, moist mucous membranes, clear oropharnyx. Lungs: Clear to auscultation  bilaterally. Heart: Regular rate and rhythm. No rubs, murmurs, or gallops. Musculoskeletal: No edema, cyanosis, or clubbing. Neuro: Alert, answering all questions appropriately. Cranial nerves grossly intact. Skin: No petechiae noted. Bruising noted in various stages of healing. Psych: Normal affect.    LAB RESULTS:  Clinical Support on 10/07/2016  Component Date Value Ref Range Status  . WBC 10/08/2016 7.9  3.6 - 11.0 K/uL Final  . RBC 10/08/2016 4.90  3.80 - 5.20 MIL/uL Final  . Hemoglobin 10/08/2016 14.4  12.0 - 16.0 g/dL Final  . HCT 10/08/2016 43.5  35.0 - 47.0 % Final  . MCV 10/08/2016 88.7  80.0 - 100.0 fL Final  . MCH 10/08/2016 29.3  26.0 - 34.0 pg Final  . MCHC 10/08/2016 33.1  32.0 - 36.0 g/dL Final  . RDW 10/08/2016 14.8*  11.5 - 14.5 % Final  . Platelets 10/08/2016 12* 150 - 400 K/uL Final   Comment: RESULT REPEATED AND VERIFIED CRITICAL RESULT CALLED TO, READ BACK BY AND VERIFIED WITH: Gave result to [facility] @ 930 10/07/2016 PWB GIANT PLATELETS SEEN PLATELET COUNT CONFIRMED BY SMEAR   . Neutrophils Relative % 10/08/2016 63  % Final  . Neutro Abs 10/08/2016 5.0  1.4 - 6.5 K/uL Final  . Lymphocytes Relative 10/08/2016 27  % Final  . Lymphs Abs 10/08/2016 2.1  1.0 - 3.6 K/uL Final  . Monocytes Relative 10/08/2016 8  % Final  . Monocytes Absolute 10/08/2016 0.6  0.2 - 0.9 K/uL Final  . Eosinophils Relative 10/08/2016 1  % Final  . Eosinophils Absolute 10/08/2016 0.1  0 - 0.7 K/uL Final  . Basophils Relative 10/08/2016 1  % Final  . Basophils Absolute 10/08/2016 0.1  0 - 0.1 K/uL Final    STUDIES: No results found.  ASSESSMENT:  Chronic refractory ITP  PLAN:   1. Chronic refractory ITP: Patient had a poor response to Prednisone, WinRho, and Rituxan. She is status post splenectomy in 2007. Her platelet count is decreased, therefore will proceed with 5 mcg/kg of Nplate today. Return to clinic in 1 month for laboratory work and consideration of Nplate and then  in 2 months for laboratory work, further evaluation, and consideration of Nplate if her platelet count falls below 100. Patient appears to only requiring injections every 3-4 weeks. Can consider Promacta 12.5mg  at a later date if necessary. Max dose of Nplate is 10 mcg/kg. 2. Nausea: Resolved.   Patient expressed understanding and was in agreement with this plan. She also understands that She can call clinic at any time with any questions, concerns, or complaints.    Lloyd Huger, MD   10/09/2016 10:22 AM

## 2016-10-07 ENCOUNTER — Inpatient Hospital Stay: Payer: Commercial Managed Care - HMO | Attending: Oncology | Admitting: Oncology

## 2016-10-07 ENCOUNTER — Inpatient Hospital Stay (HOSPITAL_BASED_OUTPATIENT_CLINIC_OR_DEPARTMENT_OTHER): Payer: Commercial Managed Care - HMO

## 2016-10-07 ENCOUNTER — Inpatient Hospital Stay: Payer: Commercial Managed Care - HMO

## 2016-10-07 VITALS — BP 135/78 | HR 63 | Temp 98.5°F | Wt 133.5 lb

## 2016-10-07 DIAGNOSIS — R5381 Other malaise: Secondary | ICD-10-CM | POA: Diagnosis not present

## 2016-10-07 DIAGNOSIS — R531 Weakness: Secondary | ICD-10-CM | POA: Insufficient documentation

## 2016-10-07 DIAGNOSIS — Z79899 Other long term (current) drug therapy: Secondary | ICD-10-CM | POA: Diagnosis not present

## 2016-10-07 DIAGNOSIS — G8929 Other chronic pain: Secondary | ICD-10-CM | POA: Diagnosis not present

## 2016-10-07 DIAGNOSIS — Z86711 Personal history of pulmonary embolism: Secondary | ICD-10-CM | POA: Insufficient documentation

## 2016-10-07 DIAGNOSIS — Z9081 Acquired absence of spleen: Secondary | ICD-10-CM | POA: Insufficient documentation

## 2016-10-07 DIAGNOSIS — D693 Immune thrombocytopenic purpura: Secondary | ICD-10-CM

## 2016-10-07 DIAGNOSIS — M1990 Unspecified osteoarthritis, unspecified site: Secondary | ICD-10-CM | POA: Insufficient documentation

## 2016-10-07 DIAGNOSIS — K219 Gastro-esophageal reflux disease without esophagitis: Secondary | ICD-10-CM | POA: Diagnosis not present

## 2016-10-07 DIAGNOSIS — M549 Dorsalgia, unspecified: Secondary | ICD-10-CM | POA: Diagnosis not present

## 2016-10-07 DIAGNOSIS — E7800 Pure hypercholesterolemia, unspecified: Secondary | ICD-10-CM | POA: Diagnosis not present

## 2016-10-07 DIAGNOSIS — M810 Age-related osteoporosis without current pathological fracture: Secondary | ICD-10-CM | POA: Diagnosis not present

## 2016-10-07 DIAGNOSIS — R5382 Chronic fatigue, unspecified: Secondary | ICD-10-CM | POA: Diagnosis not present

## 2016-10-07 MED ORDER — ROMIPLOSTIM INJECTION 500 MCG
5.0000 ug/kg | Freq: Once | SUBCUTANEOUS | Status: AC
  Administered 2016-10-07: 305 ug via SUBCUTANEOUS
  Filled 2016-10-07: qty 0.61

## 2016-10-07 NOTE — Progress Notes (Signed)
Patient here today for follow up.  Patient states no new concerns today  

## 2016-10-07 NOTE — Progress Notes (Signed)
Plts 12 today. Patient received 3mcg/kg for dose of 280mcg on 11/28. Spoke with MD, will increase dose today to 62mcg/kg for dose of 346mcg.

## 2016-10-08 DIAGNOSIS — Z79899 Other long term (current) drug therapy: Secondary | ICD-10-CM | POA: Diagnosis not present

## 2016-10-08 DIAGNOSIS — R110 Nausea: Secondary | ICD-10-CM | POA: Diagnosis not present

## 2016-10-08 DIAGNOSIS — E7800 Pure hypercholesterolemia, unspecified: Secondary | ICD-10-CM | POA: Diagnosis not present

## 2016-10-08 DIAGNOSIS — F418 Other specified anxiety disorders: Secondary | ICD-10-CM | POA: Diagnosis not present

## 2016-10-08 DIAGNOSIS — D473 Essential (hemorrhagic) thrombocythemia: Secondary | ICD-10-CM | POA: Diagnosis not present

## 2016-10-08 DIAGNOSIS — Z0000 Encounter for general adult medical examination without abnormal findings: Secondary | ICD-10-CM | POA: Diagnosis not present

## 2016-10-08 DIAGNOSIS — G301 Alzheimer's disease with late onset: Secondary | ICD-10-CM | POA: Diagnosis not present

## 2016-10-08 DIAGNOSIS — E039 Hypothyroidism, unspecified: Secondary | ICD-10-CM | POA: Diagnosis not present

## 2016-10-08 DIAGNOSIS — I10 Essential (primary) hypertension: Secondary | ICD-10-CM | POA: Diagnosis not present

## 2016-10-08 LAB — CBC WITH DIFFERENTIAL/PLATELET
Basophils Absolute: 0.1 10*3/uL (ref 0–0.1)
Basophils Relative: 1 %
EOS PCT: 1 %
Eosinophils Absolute: 0.1 10*3/uL (ref 0–0.7)
HCT: 43.5 % (ref 35.0–47.0)
Hemoglobin: 14.4 g/dL (ref 12.0–16.0)
LYMPHS ABS: 2.1 10*3/uL (ref 1.0–3.6)
LYMPHS PCT: 27 %
MCH: 29.3 pg (ref 26.0–34.0)
MCHC: 33.1 g/dL (ref 32.0–36.0)
MCV: 88.7 fL (ref 80.0–100.0)
MONO ABS: 0.6 10*3/uL (ref 0.2–0.9)
Monocytes Relative: 8 %
Neutro Abs: 5 10*3/uL (ref 1.4–6.5)
Neutrophils Relative %: 63 %
PLATELETS: 12 10*3/uL — AB (ref 150–400)
RBC: 4.9 MIL/uL (ref 3.80–5.20)
RDW: 14.8 % — AB (ref 11.5–14.5)
WBC: 7.9 10*3/uL (ref 3.6–11.0)

## 2016-10-27 ENCOUNTER — Encounter: Payer: Self-pay | Admitting: *Deleted

## 2016-10-27 ENCOUNTER — Other Ambulatory Visit: Payer: Self-pay | Admitting: *Deleted

## 2016-10-27 NOTE — Patient Outreach (Addendum)
[facility] ([facility]) Care Management   10/27/2016  Ariana [name] 08/12/1933 [id number]  Ariana [name] is an 83 y.o. female  Subjective:  Patient states she is feeling better on today, discusses she still has problem with decreased appetite, she tries different foods but just can't think of much that she wants. Patient states she had been eating lots of peanut butter but has gotten tired of that  She also drinks ensure at least on daily.   Mr.Christo discussed he has met with Loy Campbell , Dementia specialist with at [facility] for support and education session and states he will continue to follow up as needed.   Patient reports she is continuing to do her routine chores around the home, she has recently cooked a meal with husband supervision at home. Patient states she continues to walk outside home when temperatures are warmer.     Objective:  BP 130/80 (BP Location: Left Arm, Patient Position: Sitting)   Pulse 62   Resp 18   SpO2 98%   Patient ambulating independently in home. In good spirits smiling.  Review of Systems  Constitutional: Negative.   HENT: Negative.   Eyes: Negative.   Respiratory: Negative.   Cardiovascular: Negative.   Gastrointestinal: Negative.   Genitourinary: Negative.   Musculoskeletal: Negative.   Skin: Negative.   Neurological: Negative.   Endo/Heme/Allergies: Bruises/bleeds easily.  Psychiatric/Behavioral: Positive for memory loss.    Physical Exam  Constitutional: She is oriented to person, place, and time. She appears well-developed and well-nourished.  Cardiovascular: Normal rate and normal heart sounds.   Respiratory: Effort normal and breath sounds normal.  GI: Soft.  Neurological: She is alert and oriented to person, place, and time.  Skin: Skin is warm and dry.  Psychiatric: She has a normal mood and affect. Her behavior is normal. Judgment and thought content normal.    Encounter Medications:    Outpatient Encounter Prescriptions as of 10/27/2016  Medication Sig Note  . acetaminophen (TYLENOL) 325 MG tablet Take 2 tablets (650 mg total) by mouth every 6 (six) hours as needed for mild pain (or Fever >/= 101).   . albuterol (PROVENTIL HFA;VENTOLIN HFA) 108 (90 BASE) MCG/ACT inhaler Inhale 2 puffs into the lungs every 6 (six) hours as needed for wheezing or shortness of breath.   . ALPRAZolam (XANAX) 0.25 MG tablet TAKE 1 TABLET TWICE DAILY AS NEEDED FOR SLEEP 09/17/2015: Received from: [facility]  . BUDESONIDE-FORMOTEROL FUMARATE IN Inhale 2 puffs into the lungs 2 (two) times daily as needed (for shortness of breath and wheezing.).  06/29/2015: Patient is not for sure what mg she uses.  . buPROPion (WELLBUTRIN XL) 150 MG 24 hr tablet Take 1 tablet by mouth daily. 08/06/2016: Received from: [facility] Received Sig: Take 1 tablet (150 mg total) by mouth once daily.  . Calcium Carbonate-Vitamin D (CALCIUM 600+D) 600-400 MG-UNIT per tablet Take 1 tablet by mouth daily.   . citalopram (CELEXA) 40 MG tablet Take 40 mg by mouth daily.   . Cyanocobalamin (RA VITAMIN B-12 TR) 1000 MCG TBCR Take 1,000 mcg by mouth daily.  04/12/2015: Received from: [facility]  . donepezil (ARICEPT) 5 MG tablet Take 5 mg by mouth daily. 10/29/2015: Received from: [facility] Received Sig: Take 5 mg by mouth.  . Fluticasone-Salmeterol (ADVAIR) 250-50 MCG/DOSE AEPB Inhale 1 puff into the lungs 2 (two) times daily.   . gabapentin (NEURONTIN) 300 MG capsule 300mg orally 4 times a   day 04/12/2015: Received from: [facility]  . [START ON 11/14/2016] HYDROcodone-acetaminophen (NORCO) 7.5-325 MG tablet 1 po tid prn Earliest Fill Date: 11/14/16 10/07/2016: Received from: [facility]  . levothyroxine (SYNTHROID, LEVOTHROID) 50 MCG tablet Take 50 mcg by mouth daily before breakfast. Take 30 to 60 minutes before breakfast.   . memantine (NAMENDA) 5 MG tablet Take by mouth.  08/26/2015: Received from: [facility]  . Multiple Vitamins-Minerals (OCUVITE-LUTEIN PO) Take 1 capsule by mouth daily.  02/06/2015: Received from: [facility] Received Sig:   . oxybutynin (DITROPAN) 5 MG tablet Take by mouth. 10/07/2016: Received from: [facility] Received Sig: Take 1 tablet (5 mg total) by mouth 3 (three) times daily.  . pantoprazole (PROTONIX) 20 MG tablet Take 20 mg by mouth 2 (two) times daily.  07/29/2015: Received from: [facility]  . pravastatin (PRAVACHOL) 20 MG tablet Take 20 mg by mouth daily.   . traMADol (ULTRAM) 50 MG tablet Take 50 mg by mouth 3 (three) times daily as needed for moderate pain or severe pain. pain 02/06/2015: Received from: [facility] Received Sig:   . vitamin C (ASCORBIC ACID) 500 MG tablet Take 500 mg by mouth daily.   . vitamin E (E-400) 400 UNIT capsule Take 400 Units by mouth daily. 06/29/2015: Received from: [facility] Received Sig: Take by mouth.  . zolpidem (AMBIEN) 5 MG tablet Take 5 mg by mouth at bedtime as needed for sleep.    No facility-administered encounter medications on file as of 10/27/2016.     Functional Status:   In your present state of health, do you have any difficulty performing the following activities: 07/16/2016  Hearing? N  Vision? N  Difficulty concentrating or making decisions? Y  Walking or climbing stairs? Y  Dressing or bathing? N  Doing errands, shopping? Y  Preparing Food and eating ? Y  Using the Toilet? N  In the past six months, have you accidently leaked urine? Y  Do you have problems with loss of bowel control? N  Managing your Medications? Y  Managing your Finances? Y  Housekeeping or managing your Housekeeping? Y  Some recent data might be hidden    Fall/Depression Screening:    PHQ 2/9 Scores 07/16/2016 06/19/2016  PHQ - 2 Score 1 4  PHQ- 9 Score - 16    Assessment:  Co visit with  Chrystal Land , LCSW and Mr.Pozzi present.   Decreased Appetite/Weight loss  -  Weighs at PCP  visits, declines weighing at home. Weight loss in last 3 months 7 lbs per PCP visit weights.  Reinforced healthy balanced meals, snack, food choices , continued use of daily ensure to supplement meals.  Dementia - connected with Hospice/Palliative care  Dementia specialist services for support. Patient continues using her computer daily.   Fall Safety risk - no recent falls , maintaining her daily activities. LCSW assisting with resources for shower chair and grab bar installation to increase safety.       Plan:  Will plan home visit in the next 2 weeks regarding providing shower chair. Visit note with quarterly update to PCP. Reviewed and updated patient centered goals progress.    [facility] CM Care Plan Problem One   Flowsheet Row Most Recent Value  Care Plan Problem One  Knowledge deficit related to Dementa self care managment   Role Documenting the Problem One  Care Management Coordinator  Care Plan for Problem One    Active  [facility] Long Term Goal (31-90 days)  Patient will be able to identify measures to promote increased knowledge to provide a safe environment   the next 60 days    [facility] Long Term Goal Start Date  09/24/16 [goal date restarted ]  Interventions for Problem One Long Term Goal  Provided and reviewed EMMI handout on Dementia safety at home, and Dementia diagnosis   [facility] CM Short Term Goal #1 (0-30 days)  Patient will continue to walk daily in safe environment the next 30 days   [facility] CM Short Term Goal #1 Start Date  07/16/16  [facility] CM Short Term Goal #1 Met Date  09/24/16  [facility] CM Short Term Goal #2 (0-30 days)  Patient/caregiver will be able to install grab bars at tub area in the next 30 days   [facility] CM Short Term Goal #2 Start Date  09/24/16 [goal date restarted ]  Interventions for Short Term Goal #2  LCSW consulting for grab bar instatllation and shower chair, encouraged patient to  continue shower only when assistance in home   [facility] CM Short Term Goal #3 (0-30 days)  Caregiver will make appointment with neurologist in the next 30 days   [facility] CM Short Term Goal #3 Start Date  07/16/16  [facility] CM Short Term Goal #3 Met Date  08/25/16  [facility] CM Short Term Goal #4 (0-30 days)  Patient/caregiver will make contact with Dementia specialist in the next 30 days   [facility] CM Short Term Goal #4 Start Date  09/24/16 [goal date restarted ]  [facility] CM Short Term Goal #4 Met Date  10/27/16  Interventions for Short Term Goal #4  Assisted patient/spouse with call to Dementia specialist, able leave a message   [facility] CM Short Term Goal #5 (0-30 days)  Patient will be begin to drink ensure at least once daily in the next 30 days   [facility] CM Short Term Goal #5 Start Date  09/24/16  Interventions for Short Term Goal #5  Discussed with patient, healthy snack options, provided examples of protein rich foods, eating small frequent meals .      Joylene Draft, RN, [place] Management [phone]- Mobile [phone]- Toll Free Main Office

## 2016-10-27 NOTE — Patient Outreach (Signed)
[place] [facility]) Care Management  10/27/2016  Ariana [name] [birth year] [id number]   Return call from [place] through [facility]. Referral made on patient's behalf to install the grab bars. Per Jenny Reichmann, she will contact patient to set up a date and time to install the grab bar.  This Education officer, museum will continue to follow up.   Sheralyn Boatman [facility] Care Management [phone]

## 2016-10-27 NOTE — Patient Outreach (Signed)
[place] [facility]) Care Management  [facility] Social Work  10/27/2016  Ariana [name] 09-Jan-1933 [id number]  Subjective:  Patient is a 83 year old female. Patient diagnosed with Dementia.  Patient states that she is unsteady on her feet, has difficulty standing for long periods of times. Patient's spouse reports having a grab bar for the shower/tub but will need assistance with installing it. Patient's spouse further reports meeting with Vania Rea, dementia specialist with [facility] [place] for education and support. Per patient's spouse, he has learned a lot and plans to return for additional information and support. ` Objective:   Encounter Medications:  Outpatient Encounter Prescriptions as of 10/27/2016  Medication Sig Note  . acetaminophen (TYLENOL) 325 MG tablet Take 2 tablets (650 mg total) by mouth every 6 (six) hours as needed for mild pain (or Fever >/= 101).   Marland Kitchen albuterol (PROVENTIL HFA;VENTOLIN HFA) 108 (90 BASE) MCG/ACT inhaler Inhale 2 puffs into the lungs every 6 (six) hours as needed for wheezing or shortness of breath.   . ALPRAZolam (XANAX) 0.25 MG tablet TAKE 1 TABLET TWICE DAILY AS NEEDED FOR SLEEP 09/17/2015: Received from: [facility]  . BUDESONIDE-FORMOTEROL FUMARATE IN Inhale 2 puffs into the lungs 2 (two) times daily as needed (for shortness of breath and wheezing.).  06/29/2015: Patient is not for sure what mg she uses.  Marland Kitchen buPROPion (WELLBUTRIN XL) 150 MG 24 hr tablet Take 1 tablet by mouth daily. 08/06/2016: Received from: [place]: Take 1 tablet (150 mg total) by mouth once daily.  . Calcium Carbonate-Vitamin D (CALCIUM 600+D) 600-400 MG-UNIT per tablet Take 1 tablet by mouth daily.   . citalopram (CELEXA) 40 MG tablet Take 40 mg by mouth daily.   . Cyanocobalamin (RA VITAMIN B-12 TR) 1000 MCG TBCR Take 1,000 mcg by mouth daily.  04/12/2015: Received from: [facility]  .  donepezil (ARICEPT) 5 MG tablet Take 5 mg by mouth daily. 10/29/2015: Received from: [place]: Take 5 mg by mouth.  . Fluticasone-Salmeterol (ADVAIR) 250-50 MCG/DOSE AEPB Inhale 1 puff into the lungs 2 (two) times daily.   Marland Kitchen gabapentin (NEURONTIN) 300 MG capsule 300mg  orally 4 times a day 04/12/2015: Received from: [facility]  . [START ON 11/14/2016] HYDROcodone-acetaminophen (NORCO) 7.5-325 MG tablet 1 po tid prn Earliest Fill Date: 11/14/16 10/07/2016: Received from: [facility]  . levothyroxine (SYNTHROID, LEVOTHROID) 50 MCG tablet Take 50 mcg by mouth daily before breakfast. Take 30 to 60 minutes before breakfast.   . memantine (NAMENDA) 5 MG tablet Take by mouth. 08/26/2015: Received from: [place]  . Multiple Vitamins-Minerals (OCUVITE-LUTEIN PO) Take 1 capsule by mouth daily.  02/06/2015: Received from: [place]:   . oxybutynin (DITROPAN) 5 MG tablet Take by mouth. 10/07/2016: Received from: [place]: Take 1 tablet (5 mg total) by mouth 3 (three) times daily.  . pantoprazole (PROTONIX) 20 MG tablet Take 20 mg by mouth 2 (two) times daily.  07/29/2015: Received from: [place]  . pravastatin (PRAVACHOL) 20 MG tablet Take 20 mg by mouth daily.   . traMADol (ULTRAM) 50 MG tablet Take 50 mg by mouth 3 (three) times daily as needed for moderate pain or severe pain. pain 02/06/2015: Received from: [place]:   . vitamin C (ASCORBIC ACID) 500 MG tablet Take 500 mg by mouth daily.   Marland Kitchen  vitamin E (E-400) 400 UNIT capsule Take 400 Units by mouth daily. 06/29/2015: Received from: [place]: Take by mouth.  . zolpidem (AMBIEN) 5 MG tablet Take 5 mg by mouth at bedtime as needed for sleep.    No facility-administered encounter medications on file as of 10/27/2016.     Functional Status:  In your  present state of health, do you have any difficulty performing the following activities: 07/16/2016  Hearing? N  Vision? N  Difficulty concentrating or making decisions? Y  Walking or climbing stairs? Y  Dressing or bathing? N  Doing errands, shopping? Y  Preparing Food and eating ? Y  Using the Toilet? N  In the past six months, have you accidently leaked urine? Y  Do you have problems with loss of bowel control? N  Managing your Medications? Y  Managing your Finances? Y  Housekeeping or managing your Housekeeping? Y  Some recent data might be hidden    Fall/Depression Screening:  PHQ 2/9 Scores 07/16/2016 06/19/2016  PHQ - 2 Score 1 4  PHQ- 9 Score - 16    Assessment: Patient would benefit from a shower chair and grab bar for safety.  Financial assessment completed. Due to various financial commitments, patient cannot afford a shower chair at this time.  Voicemail message left with [place] to complete a referral for assistance with installing  the grab bar.   Plan: This Education officer, museum will assist patient with obtaining the shower chair pending [facility] approval. This socia; worker will follow up with [place] regarding the installation of the grab bars in the bathroom.   Sheralyn Boatman [facility] Care Management [phone]

## 2016-10-27 NOTE — Patient Outreach (Signed)
[facility]) Care Management  10/27/2016  Ariana [name] May 15, 1933 [id number]   Phone call to [name] to request assistance with installing grab bars in patient's bathroom. Voicemail message left for a return call.   Sheralyn Boatman [facility] Care Management [phone]

## 2016-10-28 ENCOUNTER — Encounter: Payer: Self-pay | Admitting: *Deleted

## 2016-11-04 ENCOUNTER — Inpatient Hospital Stay: Payer: Medicare HMO

## 2016-11-04 ENCOUNTER — Inpatient Hospital Stay: Payer: Medicare HMO | Attending: Oncology

## 2016-11-04 ENCOUNTER — Telehealth: Payer: Self-pay | Admitting: *Deleted

## 2016-11-04 DIAGNOSIS — D693 Immune thrombocytopenic purpura: Secondary | ICD-10-CM

## 2016-11-04 DIAGNOSIS — Z9081 Acquired absence of spleen: Secondary | ICD-10-CM | POA: Insufficient documentation

## 2016-11-04 MED ORDER — ROMIPLOSTIM INJECTION 500 MCG
360.0000 ug | Freq: Once | SUBCUTANEOUS | Status: AC
  Administered 2016-11-04: 360 ug via SUBCUTANEOUS
  Filled 2016-11-04: qty 0.72

## 2016-11-04 NOTE — Telephone Encounter (Signed)
Critical platelet count of 15 today. Pt scheduled for nplate infusion. MD aware.

## 2016-11-04 NOTE — Progress Notes (Signed)
Patient has been scheduled for lab/MD/Nplate in 3 weeks.

## 2016-11-04 NOTE — Progress Notes (Signed)
Plts 16 today. Patient received Nplate 56mcg/kg at last visit. Per instructions in order, will increase dose to 6mcg/kg (346mcg) as Plts are less than 50.

## 2016-11-05 LAB — CBC WITH DIFFERENTIAL/PLATELET
BASOS ABS: 0.1 10*3/uL (ref 0–0.1)
BASOS PCT: 1 %
EOS PCT: 1 %
Eosinophils Absolute: 0.1 10*3/uL (ref 0–0.7)
HCT: 41 % (ref 35.0–47.0)
Hemoglobin: 13.4 g/dL (ref 12.0–16.0)
Lymphocytes Relative: 18 %
Lymphs Abs: 2.1 10*3/uL (ref 1.0–3.6)
MCH: 29 pg (ref 26.0–34.0)
MCHC: 32.7 g/dL (ref 32.0–36.0)
MCV: 88.7 fL (ref 80.0–100.0)
MONO ABS: 0.7 10*3/uL (ref 0.2–0.9)
Monocytes Relative: 6 %
Neutro Abs: 8.4 10*3/uL — ABNORMAL HIGH (ref 1.4–6.5)
Neutrophils Relative %: 74 %
Platelets: 16 10*3/uL — CL (ref 150–440)
RBC: 4.62 MIL/uL (ref 3.80–5.20)
RDW: 16.1 % — AB (ref 11.5–14.5)
WBC: 11.4 10*3/uL — ABNORMAL HIGH (ref 3.6–11.0)

## 2016-11-09 ENCOUNTER — Other Ambulatory Visit: Payer: Self-pay | Admitting: *Deleted

## 2016-11-09 NOTE — Patient Outreach (Signed)
[name]) Care Management  11/09/2016  Ariana [name] 1933-04-30 [id number]   Phone call to patient's son on [facility] consent form to confirm installation of the grab bar in patient's bathroom.  Per patient's son, grab bar was installed on Saturday, 11/07/16.  RNCM Landis Martins to deliver shower chair on 11/10/16.  Patient to be closed to social work at this time.    Sheralyn Boatman [facility] Care Management [phone]

## 2016-11-10 ENCOUNTER — Other Ambulatory Visit: Payer: Self-pay | Admitting: *Deleted

## 2016-11-10 DIAGNOSIS — J0100 Acute maxillary sinusitis, unspecified: Secondary | ICD-10-CM | POA: Diagnosis not present

## 2016-11-10 NOTE — Patient Outreach (Signed)
[facility]) Care Management   11/10/2016  Ariana [name] 1933/08/16 [id number]  Ariana [name] is an 83 y.o. female  Subjective:  Patient complaint  of coughing all night with  greenish colored sputum nasal congestion, and sore throat  using her albuterol inhaler prn  with relief ,denies  fever  patient discussed she just returned from [place] walk in clinic and received prescriptions, for doxycycline,benzonatate, prednisone and instructed to use albuterol for wheezing or shortness of breath., Patient discussed also that she had a refill on hycodan called in by PCP on yesterday.  Patient discussed being happy to have her grab bar at shower and looking forward to using her shower chair.    Objective:  BP 124/70 (BP Location: Left Arm, Patient Position: Sitting, Cuff Size: Normal)   Pulse 80   Resp 18   SpO2 96%  Review of Systems  HENT: Negative.   Eyes: Negative.   Respiratory: Positive for cough and sputum production.   Cardiovascular: Negative.   Gastrointestinal: Negative.   Genitourinary: Negative.   Musculoskeletal: Positive for back pain.  Skin: Negative.   Neurological: Positive for weakness.  Endo/Heme/Allergies: Negative.   Psychiatric/Behavioral: Negative.     Physical Exam  Constitutional: She is oriented to person, place, and time. She appears well-developed and well-nourished.  Cardiovascular: Normal rate and normal heart sounds.   Respiratory: Effort normal.  GI: Soft.  Musculoskeletal: Normal range of motion.  Neurological: She is alert and oriented to person, place, and time.  Skin: Skin is warm and dry.  Psychiatric: She has a normal mood and affect. Her behavior is normal. Judgment and thought content normal.    Encounter Medications:   Outpatient Encounter Prescriptions as of 11/10/2016  Medication Sig Note  . acetaminophen (TYLENOL) 325 MG tablet Take 2 tablets (650 mg total) by mouth every 6 (six) hours as  needed for mild pain (or Fever >/= 101).   Marland Kitchen albuterol (PROVENTIL HFA;VENTOLIN HFA) 108 (90 BASE) MCG/ACT inhaler Inhale 2 puffs into the lungs every 6 (six) hours as needed for wheezing or shortness of breath.   . ALPRAZolam (XANAX) 0.25 MG tablet TAKE 1 TABLET TWICE DAILY AS NEEDED FOR SLEEP 09/17/2015: Received from: [facility]  . BUDESONIDE-FORMOTEROL FUMARATE IN Inhale 2 puffs into the lungs 2 (two) times daily as needed (for shortness of breath and wheezing.).  06/29/2015: Patient is not for sure what mg she uses.  Marland Kitchen buPROPion (WELLBUTRIN XL) 150 MG 24 hr tablet Take 1 tablet by mouth daily. 08/06/2016: Received from: [place]: Take 1 tablet (150 mg total) by mouth once daily.  . Calcium Carbonate-Vitamin D (CALCIUM 600+D) 600-400 MG-UNIT per tablet Take 1 tablet by mouth daily.   . citalopram (CELEXA) 40 MG tablet Take 40 mg by mouth daily.   . Cyanocobalamin (RA VITAMIN B-12 TR) 1000 MCG TBCR Take 1,000 mcg by mouth daily.  04/12/2015: Received from: [facility]  . donepezil (ARICEPT) 5 MG tablet Take 5 mg by mouth daily. 10/29/2015: Received from: [facility]: Take 5 mg by mouth.  . Fluticasone-Salmeterol (ADVAIR) 250-50 MCG/DOSE AEPB Inhale 1 puff into the lungs 2 (two) times daily.   Marland Kitchen gabapentin (NEURONTIN) 300 MG capsule 300mg  orally 4 times a day 04/12/2015: Received from: [facility]  . [START ON 11/14/2016] HYDROcodone-acetaminophen (NORCO) 7.5-325 MG tablet 1 po tid prn Earliest Fill Date: 11/14/16 10/07/2016: Received from: [facility]  . levothyroxine (SYNTHROID,  LEVOTHROID) 50 MCG tablet Take 50 mcg by mouth daily before breakfast. Take 30 to 60 minutes before breakfast.   . Multiple Vitamins-Minerals (OCUVITE-LUTEIN PO) Take 1 capsule by mouth daily.  02/06/2015: Received from: [place]:   . oxybutynin (DITROPAN) 5 MG tablet Take by mouth. 10/07/2016: Received from:  [place]: Take 1 tablet (5 mg total) by mouth 3 (three) times daily.  . pantoprazole (PROTONIX) 20 MG tablet Take 20 mg by mouth 2 (two) times daily.  07/29/2015: Received from: [place]  . pravastatin (PRAVACHOL) 20 MG tablet Take 20 mg by mouth daily.   . traMADol (ULTRAM) 50 MG tablet Take 50 mg by mouth 3 (three) times daily as needed for moderate pain or severe pain. pain 02/06/2015: Received from: [place]:   . vitamin C (ASCORBIC ACID) 500 MG tablet Take 500 mg by mouth daily.   . vitamin E (E-400) 400 UNIT capsule Take 400 Units by mouth daily. 06/29/2015: Received from: [place]: Take by mouth.  . zolpidem (AMBIEN) 5 MG tablet Take 5 mg by mouth at bedtime as needed for sleep.   . memantine (NAMENDA) 5 MG tablet Take by mouth. 08/26/2015: Received from: [place]   No facility-administered encounter medications on file as of 11/10/2016.     Functional Status:   In your present state of health, do you have any difficulty performing the following activities: 07/16/2016  Hearing? N  Vision? N  Difficulty concentrating or making decisions? Y  Walking or climbing stairs? Y  Dressing or bathing? N  Doing errands, shopping? Y  Preparing Food and eating ? Y  Using the Toilet? N  In the past six months, have you accidently leaked urine? Y  Do you have problems with loss of bowel control? N  Managing your Medications? Y  Managing your Finances? Y  Housekeeping or managing your Housekeeping? Y  Some recent data might be hidden    Fall/Depression Screening:    PHQ 2/9 Scores 07/16/2016 06/19/2016  PHQ - 2 Score 1 4  PHQ- 9 Score - 16    Assessment:    Fall safety Risk - delivered shower chair to improve safety, patient taking shower only when husband present.   Cough- Has all prescriptions filled , reinforced completing full prescriptions of  antibiotics and prednisone.  And using albuterol as needed for wheezing.  Reviewed with patient and husband  that her prescription cough syrup has hydrocodone in it, and she is prescribed norco prn, advised regarding not taking  both together.     Plan:  Patient will complete full doses of medication  Patient will notify MD if no improvement in symptoms or worsening. Will follow up next month with telephone visit, and if no new care management needs will close case, patient and husband in agreement.    Joylene Draft, RN, [facility] [phone]- Mobile [phone]- Toll Free Main Office

## 2016-11-17 DIAGNOSIS — R1312 Dysphagia, oropharyngeal phase: Secondary | ICD-10-CM | POA: Diagnosis not present

## 2016-11-17 DIAGNOSIS — R110 Nausea: Secondary | ICD-10-CM | POA: Diagnosis not present

## 2016-11-22 ENCOUNTER — Emergency Department: Payer: Medicare HMO

## 2016-11-22 ENCOUNTER — Encounter: Payer: Self-pay | Admitting: Radiology

## 2016-11-22 ENCOUNTER — Emergency Department
Admission: EM | Admit: 2016-11-22 | Discharge: 2016-11-23 | Disposition: A | Discharge status: Home / Self Care | Payer: Medicare HMO | Attending: Emergency Medicine | Admitting: Emergency Medicine

## 2016-11-22 DIAGNOSIS — R0789 Other chest pain: Secondary | ICD-10-CM | POA: Diagnosis not present

## 2016-11-22 DIAGNOSIS — Z79899 Other long term (current) drug therapy: Secondary | ICD-10-CM | POA: Diagnosis not present

## 2016-11-22 DIAGNOSIS — J45909 Unspecified asthma, uncomplicated: Secondary | ICD-10-CM | POA: Insufficient documentation

## 2016-11-22 DIAGNOSIS — R05 Cough: Secondary | ICD-10-CM | POA: Diagnosis not present

## 2016-11-22 DIAGNOSIS — F419 Anxiety disorder, unspecified: Secondary | ICD-10-CM | POA: Diagnosis not present

## 2016-11-22 DIAGNOSIS — I10 Essential (primary) hypertension: Secondary | ICD-10-CM | POA: Insufficient documentation

## 2016-11-22 DIAGNOSIS — R0602 Shortness of breath: Secondary | ICD-10-CM | POA: Diagnosis not present

## 2016-11-22 DIAGNOSIS — R059 Cough, unspecified: Secondary | ICD-10-CM

## 2016-11-22 LAB — COMPREHENSIVE METABOLIC PANEL
ALK PHOS: 88 U/L (ref 38–126)
ALT: 19 U/L (ref 14–54)
ANION GAP: 6 (ref 5–15)
AST: 26 U/L (ref 15–41)
Albumin: 3.6 g/dL (ref 3.5–5.0)
BUN: 21 mg/dL — ABNORMAL HIGH (ref 6–20)
CALCIUM: 8.9 mg/dL (ref 8.9–10.3)
CO2: 26 mmol/L (ref 22–32)
CREATININE: 0.97 mg/dL (ref 0.44–1.00)
Chloride: 110 mmol/L (ref 101–111)
GFR, EST NON AFRICAN AMERICAN: 53 mL/min — AB (ref >60)
Glucose, Bld: 90 mg/dL (ref 65–99)
Potassium: 3.9 mmol/L (ref 3.5–5.1)
SODIUM: 142 mmol/L (ref 135–145)
Total Bilirubin: 0.5 mg/dL (ref 0.3–1.2)
Total Protein: 6.4 g/dL — ABNORMAL LOW (ref 6.5–8.1)

## 2016-11-22 LAB — CBC WITH DIFFERENTIAL/PLATELET
BASOS PCT: 2 %
Basophils Absolute: 0.2 10*3/uL — ABNORMAL HIGH (ref 0–0.1)
EOS ABS: 0.3 10*3/uL (ref 0–0.7)
Eosinophils Relative: 2 %
HCT: 37.8 % (ref 35.0–47.0)
HEMOGLOBIN: 12.2 g/dL (ref 12.0–16.0)
LYMPHS ABS: 4 10*3/uL — AB (ref 1.0–3.6)
Lymphocytes Relative: 31 %
MCH: 28.8 pg (ref 26.0–34.0)
MCHC: 32.3 g/dL (ref 32.0–36.0)
MCV: 89.2 fL (ref 80.0–100.0)
MONO ABS: 1.3 10*3/uL — AB (ref 0.2–0.9)
MONOS PCT: 10 %
Neutro Abs: 7.2 10*3/uL — ABNORMAL HIGH (ref 1.4–6.5)
Neutrophils Relative %: 55 %
Platelets: 138 10*3/uL — ABNORMAL LOW (ref 150–440)
RBC: 4.24 MIL/uL (ref 3.80–5.20)
RDW: 17.2 % — AB (ref 11.5–14.5)
WBC: 13 10*3/uL — ABNORMAL HIGH (ref 3.6–11.0)

## 2016-11-22 LAB — URINALYSIS, COMPLETE (UACMP) WITH MICROSCOPIC
BACTERIA UA: NONE SEEN
Bilirubin Urine: NEGATIVE
Glucose, UA: NEGATIVE mg/dL
Hgb urine dipstick: NEGATIVE
Ketones, ur: NEGATIVE mg/dL
Leukocytes, UA: NEGATIVE
NITRITE: NEGATIVE
PROTEIN: NEGATIVE mg/dL
pH: 7 (ref 5.0–8.0)

## 2016-11-22 LAB — BRAIN NATRIURETIC PEPTIDE: B NATRIURETIC PEPTIDE 5: 126 pg/mL — AB (ref 0.0–100.0)

## 2016-11-22 LAB — INFLUENZA PANEL BY PCR (TYPE A & B)
INFLAPCR: NEGATIVE
Influenza B By PCR: NEGATIVE

## 2016-11-22 LAB — TROPONIN I

## 2016-11-22 LAB — FIBRIN DERIVATIVES D-DIMER (ARMC ONLY): FIBRIN DERIVATIVES D-DIMER (ARMC): 555 — AB (ref 0–499)

## 2016-11-22 MED ORDER — HYDROCOD POLST-CPM POLST ER 10-8 MG/5ML PO SUER
5.0000 mL | Freq: Once | ORAL | Status: AC
  Administered 2016-11-22: 5 mL via ORAL
  Filled 2016-11-22: qty 5

## 2016-11-22 MED ORDER — IOPAMIDOL (ISOVUE-370) INJECTION 76%
75.0000 mL | Freq: Once | INTRAVENOUS | Status: AC | PRN
  Administered 2016-11-22: 75 mL via INTRAVENOUS

## 2016-11-22 MED ORDER — LORAZEPAM 1 MG PO TABS
1.0000 mg | ORAL_TABLET | Freq: Once | ORAL | Status: AC
  Administered 2016-11-22: 1 mg via ORAL
  Filled 2016-11-22: qty 1

## 2016-11-22 MED ORDER — BENZONATATE 100 MG PO CAPS
200.0000 mg | ORAL_CAPSULE | Freq: Once | ORAL | Status: AC
  Administered 2016-11-22: 200 mg via ORAL

## 2016-11-22 MED ORDER — LORAZEPAM 0.5 MG PO TABS: 0.5000 mg | ORAL_TABLET | Freq: Three times a day (TID) | ORAL | 0 refills | Status: AC | PRN

## 2016-11-22 MED ORDER — HYDROCOD POLST-CPM POLST ER 10-8 MG/5ML PO SUER: 5.0000 mL | Freq: Two times a day (BID) | ORAL | 0 refills | Status: DC

## 2016-11-22 MED ORDER — BENZONATATE 100 MG PO CAPS
ORAL_CAPSULE | ORAL | Status: AC
  Filled 2016-11-22: qty 2

## 2016-11-22 NOTE — ED Triage Notes (Signed)
Patient reports "it hurts to breath".  Patient reports pain into her back and that her chest hurts when she breaths.

## 2016-11-22 NOTE — ED Provider Notes (Signed)
[facility] Emergency Department Provider Note        Time seen: ----------------------------------------- 7:47 PM on 11/22/2016 -----------------------------------------    I have reviewed the triage vital signs and the nursing notes.   HISTORY  Chief Complaint Shortness of Breath and Chest Pain    HPI Ariana [name] is a 83 y.o. female who presents to the ER for difficulty breathing. Patient reports pain into her back and her chest when she breathes.She has had productive cough, does not have specific pain in her chest, but more of a generalized discomfort. She denies fevers or chills, denies vomiting or diarrhea. Family states she has symptoms like this before with a cold.   Past Medical History:  Diagnosis Date  . Anemia   . Anxiety   . Asthma   . Chronic back pain   . Depression   . Depression   . GERD (gastroesophageal reflux disease)   . Hiatal hernia   . Hypercholesteremia   . ITP (idiopathic thrombocytopenic purpura)   . Osteoarthritis   . Osteoporosis   . Pulmonary emboli ([facility])   . Restless legs     Patient Active Problem List   Diagnosis Date Noted  . Back muscle spasm 09/09/2015  . Clinical depression 04/12/2015  . Herpes zona 04/12/2015  . Chronic ITP (idiopathic thrombocytopenic purpura) (HCC) 03/15/2015  . Chest pain 10/22/2014  . Breathlessness on exertion 10/22/2014  . Osteopenia 10/18/2014  . Benign essential HTN 06/28/2014  . HLD (hyperlipidemia) 06/28/2014  . Acquired hypothyroidism 06/04/2014  . Anxiety and depression 06/04/2014  . Bursitis, trochanteric 03/27/2014  . DDD (degenerative disc disease), lumbar 02/27/2014  . Neuritis or radiculitis due to rupture of lumbar intervertebral disc 02/27/2014    Past Surgical History:  Procedure Laterality Date  . CARPAL TUNNEL RELEASE    . ESOPHAGOGASTRODUODENOSCOPY (EGD) WITH PROPOFOL N/A 09/10/2015   Procedure: ESOPHAGOGASTRODUODENOSCOPY (EGD) WITH  PROPOFOL;  Surgeon: Lollie Sails, MD;  Location: [facility] ENDOSCOPY;  Service: Endoscopy;  Laterality: N/A;  . LUMBAR [place]    . SPLENECTOMY, PARTIAL    . TOTAL ABDOMINAL HYSTERECTOMY      Allergies Aspirin; Diazepam; Morphine; Other; and Tetanus toxoids  Social History Social History  Substance Use Topics  . Smoking status: Never Smoker  . Smokeless tobacco: Never Used  . Alcohol use No    Review of Systems Constitutional: Negative for fever. Cardiovascular: Positive for chest pain Respiratory: Positive for difficulty breathing and cough Gastrointestinal: Negative for abdominal pain, vomiting and diarrhea. Genitourinary: Negative for dysuria. Musculoskeletal: Negative for back pain. Skin: Negative for rash. Neurological: Negative for headaches, focal weakness or numbness.  10-point ROS otherwise negative.  ____________________________________________   PHYSICAL EXAM:  VITAL SIGNS: ED Triage Vitals [11/22/16 1937]  Enc Vitals Group     BP      Pulse      Resp      Temp      Temp src      SpO2      Weight 145 lb (65.8 kg)     Height 5\' 5"  (1.651 m)     Head Circumference      Peak Flow      Pain Score      Pain Loc      Pain Edu?      Excl. in [place]?     Constitutional: Alert and oriented. No distress Eyes: Conjunctivae are normal. PERRL. Normal extraocular movements. ENT   Head: Normocephalic and atraumatic.   Nose:  No congestion/rhinnorhea.   Mouth/Throat: Mucous membranes are moist.   Neck: No stridor. Cardiovascular: Normal rate, regular rhythm. No murmurs, rubs, or gallops. Respiratory: Normal respiratory effort without tachypnea nor retractions. Breath sounds are clear and equal bilaterally. No wheezes/rales/rhonchi. Gastrointestinal: Soft and nontender. Normal bowel sounds Musculoskeletal: Nontender with normal range of motion in all extremities. No lower extremity tenderness nor edema. Neurologic:  Normal speech and language. No  gross focal neurologic deficits are appreciated.  Skin:  Skin is warm, dry and intact. No rash noted. Psychiatric: Mood and affect are normal. Speech and behavior are normal.  ____________________________________________  EKG: Interpreted by me. Sinus rhythm rate of 65 bpm, normal PR interval, normal QRS, normal QT, leftward axis.  ____________________________________________  ED COURSE:  Pertinent labs & imaging results that were available during my care of the patient were reviewed by me and considered in my medical decision making (see chart for details). Patient presents to ER with chest pain of uncertain etiology and cough. We will assess with labs and imaging.   Procedures ____________________________________________   LABS (pertinent positives/negatives)  Labs Reviewed  CBC WITH DIFFERENTIAL/PLATELET - Abnormal; Notable for the following:       Result Value   WBC 13.0 (*)    RDW 17.2 (*)    Platelets 138 (*)    Neutro Abs 7.2 (*)    Lymphs Abs 4.0 (*)    Monocytes Absolute 1.3 (*)    Basophils Absolute 0.2 (*)    All other components within normal limits  COMPREHENSIVE METABOLIC PANEL - Abnormal; Notable for the following:    BUN 21 (*)    Total Protein 6.4 (*)    GFR calc non Af Amer 53 (*)    All other components within normal limits  FIBRIN DERIVATIVES D-DIMER ([facility] ONLY) - Abnormal; Notable for the following:    Fibrin derivatives D-dimer ([facility]) 555 (*)    All other components within normal limits  BRAIN NATRIURETIC PEPTIDE - Abnormal; Notable for the following:    B Natriuretic Peptide 126.0 (*)    All other components within normal limits  TROPONIN I    RADIOLOGY Images were viewed by me  Chest x-ray IMPRESSION: No evidence of pulmonary emboli.  No acute abnormality noted. IMPRESSION: No active cardiopulmonary disease.  Old right posterior seventh rib fracture.  Spinal degenerative change. ____________________________________________  FINAL  ASSESSMENT AND PLAN  Chest pain, cough, dyspnea  Plan: Patient with labs and imaging as dictated above. Patient presented to the ER with signs of panic attack and URI symptoms. Her labs and CT imaging was reassuring. Currently she is feeling better. She'll be given cough medicine and she is stable for outpatient follow-up.   Earleen Newport, MD   Note: This note was generated in part or whole with voice recognition software. Voice recognition is usually quite accurate but there are transcription errors that can and very often do occur. I apologize for any typographical errors that were not detected and corrected.     Earleen Newport, MD 11/22/16 2139

## 2016-11-23 ENCOUNTER — Other Ambulatory Visit: Payer: Self-pay | Admitting: *Deleted

## 2016-11-23 NOTE — Patient Outreach (Signed)
[facility]) Care Management  11/23/2016  Barbe Thorup April 07, 1933 [id number]  Follow telephone call on nurse line call  Placed call to Mrs.Mannor able to speak with her husband, hipaa information verified. Kyung Rudd discussed patient complaint of cough, and chest tightness on yesterday and visit to Emergency room. He states patient has been resting well on today. No increase in shortness of breath, no fever,  no complaint of increased cough, reports she is taking cough medication as prescribed and he is managing her medication .  Patient has albuterol inhaler for use as needed.  Discussed importance of following up with PCP after ED visit ,and DC instructions recommending visit in 2 days , Kyung Rudd agreed that he would be follow up appointment with Dr.Sparks.  Plan Patient/family will schedule PCP appointment  Patient will see medical attention for increased shortness of breath, cough or fever.  Will follow up with patient in the next 3 days by telephone.    Joylene Draft, RN, [facility] [phone]- Mobile [phone]- Toll Free Main Office

## 2016-11-24 NOTE — Progress Notes (Signed)
[place]  Telephone:(336) [phone]  Fax:([phone]     Ariana [name] DOB: [birth year]  MR#: [id number]  WIO#:[id number]  Patient Care Team: Idelle Crouch, MD as PCP - General (Internal Medicine) Alfonzo Feller, RN as [facility] Management  CHIEF COMPLAINT: Chronic refractory ITP  INTERVAL HISTORY:  Patient returns to clinic today for repeat laboratory work and consideration of additional Nplate. She has increased weakness and fatigue in the past several days which she attributes to worsening congestion and cold-like symptoms. She otherwise feels well.  She does not complain of easy bruising. She has no neurologic complaints. She denies any fevers. She has no chest pain or shortness of breath. She denies any nausea, vomiting, constipation, or diarrhea. She has no urinary complaints. Patient offers no further specific complaints today.  REVIEW OF SYSTEMS:   Review of Systems  Constitutional: Positive for malaise/fatigue. Negative for fever and weight loss.  HENT: Positive for congestion.   Respiratory: Negative.  Negative for shortness of breath.   Cardiovascular: Negative.  Negative for chest pain and leg swelling.  Gastrointestinal: Negative for abdominal pain, blood in stool, diarrhea, melena and nausea.  Genitourinary: Negative for dysuria and hematuria.  Musculoskeletal: Negative.   Skin: Negative for rash.  Neurological: Positive for weakness.  Endo/Heme/Allergies: Does not bruise/bleed easily.  Psychiatric/Behavioral: Negative.  The patient is not nervous/anxious.     As per HPI. Otherwise, a complete review of systems is negative.  PAST MEDICAL HISTORY: Past Medical History:  Diagnosis Date  . Anemia   . Anxiety   . Asthma   . Chronic back pain   . Depression   . Depression   . GERD (gastroesophageal reflux disease)   . Hiatal hernia   . Hypercholesteremia   . ITP (idiopathic thrombocytopenic purpura)   .  Osteoarthritis   . Osteoporosis   . Pulmonary emboli ([place])   . Restless legs     PAST SURGICAL HISTORY: Past Surgical History:  Procedure Laterality Date  . CARPAL TUNNEL RELEASE    . ESOPHAGOGASTRODUODENOSCOPY (EGD) WITH PROPOFOL N/A 09/10/2015   Procedure: ESOPHAGOGASTRODUODENOSCOPY (EGD) WITH PROPOFOL;  Surgeon: Lollie Sails, MD;  Location: [facility] ENDOSCOPY;  Service: Endoscopy;  Laterality: N/A;  . LUMBAR [place]    . SPLENECTOMY, PARTIAL    . TOTAL ABDOMINAL HYSTERECTOMY      FAMILY HISTORY Family History  Problem Relation Age of Onset  . Stroke Mother     GYNECOLOGIC HISTORY:  No LMP recorded. Patient is postmenopausal.     ADVANCED DIRECTIVES:    HEALTH MAINTENANCE: Social History  Substance Use Topics  . Smoking status: Never Smoker  . Smokeless tobacco: Never Used  . Alcohol use No     Allergies  Allergen Reactions  . Aspirin     Other reaction(s): Distress (finding)  . Diazepam     Other reaction(s): Itching of Skin sneezing  . Morphine     Other reaction(s): Itching of Skin  . Other     Other reaction(s): Unknown seasonal allergies  . Tetanus Toxoids     Other reaction(s): Localized superficial swelling of skin    Current Outpatient Prescriptions  Medication Sig Dispense Refill  . acetaminophen (TYLENOL) 325 MG tablet Take 2 tablets (650 mg total) by mouth every 6 (six) hours as needed for mild pain (or Fever >/= 101). 100 tablet 0  . albuterol (PROVENTIL HFA;VENTOLIN HFA) 108 (90 BASE) MCG/ACT inhaler Inhale 2 puffs into the lungs every 6 (six)  hours as needed for wheezing or shortness of breath.    . ALPRAZolam (XANAX) 0.25 MG tablet TAKE 1 TABLET TWICE DAILY AS NEEDED FOR SLEEP    . BUDESONIDE-FORMOTEROL FUMARATE IN Inhale 2 puffs into the lungs 2 (two) times daily as needed (for shortness of breath and wheezing.).     Marland Kitchen buPROPion (WELLBUTRIN XL) 150 MG 24 hr tablet Take 1 tablet by mouth daily.    . Calcium Carbonate-Vitamin D  (CALCIUM 600+D) 600-400 MG-UNIT per tablet Take 1 tablet by mouth daily.    . chlorpheniramine-HYDROcodone (TUSSIONEX PENNKINETIC ER) 10-8 MG/5ML SUER Take 5 mLs by mouth 2 (two) times daily. 140 mL 0  . citalopram (CELEXA) 40 MG tablet Take 40 mg by mouth daily.    . Cyanocobalamin (RA VITAMIN B-12 TR) 1000 MCG TBCR Take 1,000 mcg by mouth daily.     Marland Kitchen donepezil (ARICEPT) 5 MG tablet Take 5 mg by mouth daily.    . Fluticasone-Salmeterol (ADVAIR) 250-50 MCG/DOSE AEPB Inhale 1 puff into the lungs 2 (two) times daily.    Marland Kitchen gabapentin (NEURONTIN) 300 MG capsule 300mg  orally 4 times a day    . HYDROcodone-acetaminophen (NORCO) 7.5-325 MG tablet 1 po tid prn Earliest Fill Date: 11/14/16    . levothyroxine (SYNTHROID, LEVOTHROID) 50 MCG tablet Take 50 mcg by mouth daily before breakfast. Take 30 to 60 minutes before breakfast.    . Multiple Vitamins-Minerals (OCUVITE-LUTEIN PO) Take 1 capsule by mouth daily.     01/12/17 oxybutynin (DITROPAN) 5 MG tablet Take by mouth.    . pantoprazole (PROTONIX) 20 MG tablet Take 20 mg by mouth 2 (two) times daily.     . pravastatin (PRAVACHOL) 20 MG tablet Take 20 mg by mouth daily.    . traMADol (ULTRAM) 50 MG tablet Take 50 mg by mouth 3 (three) times daily as needed for moderate pain or severe pain. pain    . vitamin C (ASCORBIC ACID) 500 MG tablet Take 500 mg by mouth daily.    . vitamin E (E-400) 400 UNIT capsule Take 400 Units by mouth daily.    Marland Kitchen LORazepam (ATIVAN) 0.5 MG tablet Take 1 tablet (0.5 mg total) by mouth every 8 (eight) hours as needed for anxiety. (Patient not taking: Reported on 11/25/2016) 30 tablet 0  . memantine (NAMENDA) 5 MG tablet Take by mouth.    . zolpidem (AMBIEN) 5 MG tablet Take 5 mg by mouth at bedtime as needed for sleep.     No current facility-administered medications for this visit.     OBJECTIVE: BP 130/70 (BP Location: Left Arm, Patient Position: Sitting)   Pulse 67   Temp 98.1 F (36.7 C) (Tympanic)   Resp 18   Wt 145 lb 8.1  oz (66 kg)   SpO2 96%   BMI 24.21 kg/m    Body mass index is 24.21 kg/m.    ECOG FS:1 - Symptomatic but completely ambulatory  General: Well-developed, well-nourished, no acute distress. Eyes: Pink conjunctiva, anicteric sclera. HEENT: Normocephalic, moist mucous membranes, clear oropharnyx. Lungs: Clear to auscultation bilaterally. Heart: Regular rate and rhythm. No rubs, murmurs, or gallops. Musculoskeletal: No edema, cyanosis, or clubbing. Neuro: Alert, answering all questions appropriately. Cranial nerves grossly intact. Skin: No petechiae noted. Bruising noted in various stages of healing. Psych: Normal affect.    LAB RESULTS:  Admission on 11/22/2016, Discharged on 11/23/2016  Component Date Value Ref Range Status  . WBC 11/22/2016 13.0* 3.6 - 11.0 K/uL Final  . RBC 11/22/2016 4.24  3.80 - 5.20 MIL/uL Final  . Hemoglobin 11/22/2016 12.2  12.0 - 16.0 g/dL Final  . HCT 11/22/2016 37.8  35.0 - 47.0 % Final  . MCV 11/22/2016 89.2  80.0 - 100.0 fL Final  . MCH 11/22/2016 28.8  26.0 - 34.0 pg Final  . MCHC 11/22/2016 32.3  32.0 - 36.0 g/dL Final  . RDW 11/22/2016 17.2* 11.5 - 14.5 % Final  . Platelets 11/22/2016 138* 150 - 440 K/uL Final  . Neutrophils Relative % 11/22/2016 55  % Final  . Neutro Abs 11/22/2016 7.2* 1.4 - 6.5 K/uL Final  . Lymphocytes Relative 11/22/2016 31  % Final  . Lymphs Abs 11/22/2016 4.0* 1.0 - 3.6 K/uL Final  . Monocytes Relative 11/22/2016 10  % Final  . Monocytes Absolute 11/22/2016 1.3* 0.2 - 0.9 K/uL Final  . Eosinophils Relative 11/22/2016 2  % Final  . Eosinophils Absolute 11/22/2016 0.3  0 - 0.7 K/uL Final  . Basophils Relative 11/22/2016 2  % Final  . Basophils Absolute 11/22/2016 0.2* 0 - 0.1 K/uL Final  . Troponin I 11/22/2016 <0.03  <0.03 ng/mL Final  . Sodium 11/22/2016 142  135 - 145 mmol/L Final  . Potassium 11/22/2016 3.9  3.5 - 5.1 mmol/L Final  . Chloride 11/22/2016 110  101 - 111 mmol/L Final  . CO2 11/22/2016 26  22 - 32 mmol/L  Final  . Glucose, Bld 11/22/2016 90  65 - 99 mg/dL Final  . BUN 11/22/2016 21* 6 - 20 mg/dL Final  . Creatinine, Ser 11/22/2016 0.97  0.44 - 1.00 mg/dL Final  . Calcium 11/22/2016 8.9  8.9 - 10.3 mg/dL Final  . Total Protein 11/22/2016 6.4* 6.5 - 8.1 g/dL Final  . Albumin 11/22/2016 3.6  3.5 - 5.0 g/dL Final  . AST 11/22/2016 26  15 - 41 U/L Final  . ALT 11/22/2016 19  14 - 54 U/L Final  . Alkaline Phosphatase 11/22/2016 88  38 - 126 U/L Final  . Total Bilirubin 11/22/2016 0.5  0.3 - 1.2 mg/dL Final  . GFR calc non Af Amer 11/22/2016 53* >60 mL/min Final  . GFR calc Af Amer 11/22/2016 >60  >60 mL/min Final   Comment: (NOTE) The eGFR has been calculated using the CKD EPI equation. This calculation has not been validated in all clinical situations. eGFR's persistently <60 mL/min signify possible Chronic Kidney Disease.   . Anion gap 11/22/2016 6  5 - 15 Final  . Fibrin derivatives D-dimer ([facility]) 11/22/2016 555* 0 - 499 Final   Comment: (NOTE) <> Exclusion of Venous Thromboembolism (VTE) - OUTPATIENT ONLY   (Emergency Department or [place])   0-499 ng/ml (FEU): With a low to intermediate pretest probability                      for VTE this test result excludes the diagnosis                      of VTE.   >499 ng/ml (FEU) : VTE not excluded; additional work up for VTE is                      required. <> Testing on Inpatients and Evaluation of Disseminated Intravascular   Coagulation (DIC) Reference Range:   0-499 ng/ml (FEU)   . B Natriuretic Peptide 11/22/2016 126.0* 0.0 - 100.0 pg/mL Final  . Influenza A By PCR 11/22/2016 NEGATIVE  NEGATIVE Final  . Influenza B By  PCR 11/22/2016 NEGATIVE  NEGATIVE Final  . Color, Urine 11/22/2016 YELLOW* YELLOW Final  . APPearance 11/22/2016 CLEAR* CLEAR Final  . Specific Gravity, Urine 11/22/2016 >1.046* 1.005 - 1.030 Final  . pH 11/22/2016 7.0  5.0 - 8.0 Final  . Glucose, UA 11/22/2016 NEGATIVE  NEGATIVE mg/dL Final  . Hgb urine dipstick  11/22/2016 NEGATIVE  NEGATIVE Final  . Bilirubin Urine 11/22/2016 NEGATIVE  NEGATIVE Final  . Ketones, ur 11/22/2016 NEGATIVE  NEGATIVE mg/dL Final  . Protein, ur 11/22/2016 NEGATIVE  NEGATIVE mg/dL Final  . Nitrite 11/22/2016 NEGATIVE  NEGATIVE Final  . Leukocytes, UA 11/22/2016 NEGATIVE  NEGATIVE Final  . RBC / HPF 11/22/2016 0-5  0 - 5 RBC/hpf Final  . WBC, UA 11/22/2016 0-5  0 - 5 WBC/hpf Final  . Bacteria, UA 11/22/2016 NONE SEEN  NONE SEEN Final  . Squamous Epithelial / LPF 11/22/2016 0-5* NONE SEEN Final  . Mucous 11/22/2016 PRESENT   Final    STUDIES: No results found.  ASSESSMENT:  Chronic refractory ITP  PLAN:   1. Chronic refractory ITP: Patient had a poor response to Prednisone, WinRho, and Rituxan. She is status post splenectomy in 2007. Her platelet count is 138, therefore She does not require 5 mcg/kg of Nplate today. Return to clinic in 2 weeks for laboratory work and consideration of Nplate and then in 1 month for laboratory work, further evaluation, and consideration of Nplate if her platelet count falls below 100. Patient appears to only requiring injections every 3-4 weeks. Can consider Promacta 12.'5mg'$  at a later date if necessary. Max dose of Nplate is 10 mcg/kg. 2. Nausea: Resolved.  2. Congestion: Continue symptomatic treatment as indicated.  Patient expressed understanding and was in agreement with this plan. She also understands that She can call clinic at any time with any questions, concerns, or complaints.    Lloyd Huger, MD   11/27/2016 1:47 PM

## 2016-11-25 ENCOUNTER — Inpatient Hospital Stay: Payer: Medicare HMO | Attending: Oncology

## 2016-11-25 ENCOUNTER — Inpatient Hospital Stay (HOSPITAL_BASED_OUTPATIENT_CLINIC_OR_DEPARTMENT_OTHER): Payer: Medicare HMO | Admitting: Oncology

## 2016-11-25 ENCOUNTER — Inpatient Hospital Stay: Payer: Medicare HMO

## 2016-11-25 VITALS — BP 130/70 | HR 67 | Temp 98.1°F | Resp 18 | Wt 145.5 lb

## 2016-11-25 DIAGNOSIS — R5381 Other malaise: Secondary | ICD-10-CM | POA: Insufficient documentation

## 2016-11-25 DIAGNOSIS — G8929 Other chronic pain: Secondary | ICD-10-CM | POA: Diagnosis not present

## 2016-11-25 DIAGNOSIS — D693 Immune thrombocytopenic purpura: Secondary | ICD-10-CM | POA: Insufficient documentation

## 2016-11-25 DIAGNOSIS — D473 Essential (hemorrhagic) thrombocythemia: Secondary | ICD-10-CM | POA: Diagnosis not present

## 2016-11-25 DIAGNOSIS — E7800 Pure hypercholesterolemia, unspecified: Secondary | ICD-10-CM | POA: Insufficient documentation

## 2016-11-25 DIAGNOSIS — Z86711 Personal history of pulmonary embolism: Secondary | ICD-10-CM | POA: Insufficient documentation

## 2016-11-25 DIAGNOSIS — K219 Gastro-esophageal reflux disease without esophagitis: Secondary | ICD-10-CM | POA: Insufficient documentation

## 2016-11-25 DIAGNOSIS — M549 Dorsalgia, unspecified: Secondary | ICD-10-CM | POA: Insufficient documentation

## 2016-11-25 DIAGNOSIS — Z79899 Other long term (current) drug therapy: Secondary | ICD-10-CM | POA: Diagnosis not present

## 2016-11-25 DIAGNOSIS — R05 Cough: Secondary | ICD-10-CM | POA: Diagnosis not present

## 2016-11-25 DIAGNOSIS — Z9081 Acquired absence of spleen: Secondary | ICD-10-CM | POA: Diagnosis not present

## 2016-11-25 DIAGNOSIS — R5383 Other fatigue: Secondary | ICD-10-CM | POA: Diagnosis not present

## 2016-11-25 DIAGNOSIS — J4521 Mild intermittent asthma with (acute) exacerbation: Secondary | ICD-10-CM | POA: Diagnosis not present

## 2016-11-25 DIAGNOSIS — R0981 Nasal congestion: Secondary | ICD-10-CM

## 2016-11-25 DIAGNOSIS — R531 Weakness: Secondary | ICD-10-CM | POA: Diagnosis not present

## 2016-11-25 DIAGNOSIS — Z780 Asymptomatic menopausal state: Secondary | ICD-10-CM | POA: Insufficient documentation

## 2016-11-25 DIAGNOSIS — R0602 Shortness of breath: Secondary | ICD-10-CM | POA: Diagnosis not present

## 2016-11-25 DIAGNOSIS — J45909 Unspecified asthma, uncomplicated: Secondary | ICD-10-CM | POA: Insufficient documentation

## 2016-11-25 DIAGNOSIS — F3341 Major depressive disorder, recurrent, in partial remission: Secondary | ICD-10-CM | POA: Diagnosis not present

## 2016-11-26 ENCOUNTER — Other Ambulatory Visit: Payer: Self-pay | Admitting: *Deleted

## 2016-11-26 NOTE — Patient Outreach (Signed)
[facility]) Care Management  11/26/2016  Echo Chaparro September 23, 1933 [id number]   Telephone assessment call  Placed call to patient , able to speak with her husband, Geetha Hornick, he reports that patient is resting at this time. Patient had follow up visit with her PCP after recent ED visit related to shortness of breath, productive cough with green sputum.  Mr.Rhody discussed that received refill on her nebulizer medication , antibiotic, prednisone and cough medication , he discussed he is managing patient medication as prescribed.  Mr.Insenberg discussed patient appetite is good, and she has not experienced any weight loss she has added on a couple of pounds.   Plan Patient will complete all medications as prescribed Patient/caregiver will notify MD of worsening symptoms of shortness of breath, and 911 for emergency. Will continue to follow patient for community care management needs, patient centered care goals discussed.  Will follow up with patient by telephone in the next week.   Joylene Draft, RN, [place] Management [phone]- Mobile [phone]- Toll Free Main Office

## 2016-11-30 DIAGNOSIS — R05 Cough: Secondary | ICD-10-CM | POA: Diagnosis not present

## 2016-11-30 DIAGNOSIS — J4530 Mild persistent asthma, uncomplicated: Secondary | ICD-10-CM | POA: Diagnosis not present

## 2016-11-30 DIAGNOSIS — J310 Chronic rhinitis: Secondary | ICD-10-CM | POA: Diagnosis not present

## 2016-11-30 DIAGNOSIS — R0602 Shortness of breath: Secondary | ICD-10-CM | POA: Diagnosis not present

## 2016-12-02 ENCOUNTER — Ambulatory Visit: Payer: Commercial Managed Care - HMO | Admitting: Oncology

## 2016-12-02 ENCOUNTER — Other Ambulatory Visit: Payer: Commercial Managed Care - HMO

## 2016-12-02 ENCOUNTER — Ambulatory Visit: Payer: Commercial Managed Care - HMO

## 2016-12-07 ENCOUNTER — Other Ambulatory Visit: Payer: Self-pay | Admitting: *Deleted

## 2016-12-07 NOTE — Patient Outreach (Signed)
[facility]) Care Management  12/07/2016  Ariana [name] 13-Dec-1932 [id number]   Follow up telephone call  Placed follow up call to patient , able to speak with her caregiver husband Ariana [name], hipaa information verified, he discussed patient is doing much better. Patient recent visit with pulmonary, Dr.Fleming and started on singular and flonase. Ariana [name] reports patient has completed antibiotics and  continues to take her medications as prescribed including nebulizer treatments, he assist patient with daily medications. Reports patient  no longer has a cough and not needing liquid cough medication prescribed.   Ariana [name] is able to tolerate usual home activity, appetite and intake is still good. Patient has not experienced any recent falls.   Denies any new concerns at this time, will continue to follow patient for community management, discussed case closure if no new concerns at next visit, declines need for home visit, will contact by telephone for next visit . Reinforced with caregiver to notify me if new concerns arise.  Plan Will update progress toward care goals' Will plan case closure at telephone visit  in next month if no new concerns . Reinforced with care giver to notify MD sooner for new or recurrent symptoms and urgent care/ED for emergency .    Ariana Draft, RN, [place] Management [phone]- Mobile [phone]- Toll Free Main Office

## 2016-12-09 ENCOUNTER — Inpatient Hospital Stay: Payer: Medicare HMO

## 2016-12-09 DIAGNOSIS — D693 Immune thrombocytopenic purpura: Secondary | ICD-10-CM | POA: Diagnosis not present

## 2016-12-09 DIAGNOSIS — E7800 Pure hypercholesterolemia, unspecified: Secondary | ICD-10-CM | POA: Diagnosis not present

## 2016-12-09 DIAGNOSIS — R5381 Other malaise: Secondary | ICD-10-CM | POA: Diagnosis not present

## 2016-12-09 DIAGNOSIS — G8929 Other chronic pain: Secondary | ICD-10-CM | POA: Diagnosis not present

## 2016-12-09 DIAGNOSIS — K219 Gastro-esophageal reflux disease without esophagitis: Secondary | ICD-10-CM | POA: Diagnosis not present

## 2016-12-09 DIAGNOSIS — R5383 Other fatigue: Secondary | ICD-10-CM | POA: Diagnosis not present

## 2016-12-09 DIAGNOSIS — Z9081 Acquired absence of spleen: Secondary | ICD-10-CM | POA: Diagnosis not present

## 2016-12-09 DIAGNOSIS — R0981 Nasal congestion: Secondary | ICD-10-CM | POA: Diagnosis not present

## 2016-12-09 DIAGNOSIS — R531 Weakness: Secondary | ICD-10-CM | POA: Diagnosis not present

## 2016-12-09 LAB — CBC WITH DIFFERENTIAL/PLATELET
Basophils Absolute: 0.2 10*3/uL — ABNORMAL HIGH (ref 0–0.1)
Basophils Relative: 2 %
EOS ABS: 0.3 10*3/uL (ref 0–0.7)
EOS PCT: 3 %
HCT: 39.1 % (ref 35.0–47.0)
HEMOGLOBIN: 13 g/dL (ref 12.0–16.0)
LYMPHS ABS: 2.8 10*3/uL (ref 1.0–3.6)
Lymphocytes Relative: 24 %
MCH: 30 pg (ref 26.0–34.0)
MCHC: 33.2 g/dL (ref 32.0–36.0)
MCV: 90.5 fL (ref 80.0–100.0)
MONO ABS: 1 10*3/uL — AB (ref 0.2–0.9)
Monocytes Relative: 8 %
NEUTROS PCT: 63 %
Neutro Abs: 7.3 10*3/uL — ABNORMAL HIGH (ref 1.4–6.5)
Platelets: 11 10*3/uL — CL (ref 150–400)
RBC: 4.32 MIL/uL (ref 3.80–5.20)
RDW: 17.7 % — ABNORMAL HIGH (ref 11.5–14.5)
WBC: 11.5 10*3/uL — ABNORMAL HIGH (ref 3.6–11.0)

## 2016-12-09 MED ORDER — ROMIPLOSTIM INJECTION 500 MCG
400.0000 ug | Freq: Once | SUBCUTANEOUS | Status: AC
  Administered 2016-12-09: 400 ug via SUBCUTANEOUS
  Filled 2016-12-09: qty 0.8

## 2016-12-09 NOTE — Progress Notes (Signed)
Nplate 64mcg/kg ordered today.  Patient got Nplate 10/07/16 74mcg/kg (plt =12), 11/04/16 33mcg/kg given (plt 16), Plt =11 on 11/22/16 No nplate given.  Today plt =11.  Verified dose with MD.  Wants to give 90mcg/kg today.

## 2016-12-11 DIAGNOSIS — G894 Chronic pain syndrome: Secondary | ICD-10-CM | POA: Diagnosis not present

## 2016-12-14 DIAGNOSIS — M5416 Radiculopathy, lumbar region: Secondary | ICD-10-CM | POA: Diagnosis not present

## 2016-12-14 DIAGNOSIS — M5136 Other intervertebral disc degeneration, lumbar region: Secondary | ICD-10-CM | POA: Diagnosis not present

## 2016-12-14 DIAGNOSIS — M62830 Muscle spasm of back: Secondary | ICD-10-CM | POA: Diagnosis not present

## 2016-12-14 DIAGNOSIS — G894 Chronic pain syndrome: Secondary | ICD-10-CM | POA: Diagnosis not present

## 2016-12-14 DIAGNOSIS — F1190 Opioid use, unspecified, uncomplicated: Secondary | ICD-10-CM | POA: Diagnosis not present

## 2016-12-14 DIAGNOSIS — D696 Thrombocytopenia, unspecified: Secondary | ICD-10-CM | POA: Diagnosis not present

## 2016-12-15 DIAGNOSIS — F0280 Dementia in other diseases classified elsewhere without behavioral disturbance: Secondary | ICD-10-CM | POA: Diagnosis not present

## 2016-12-15 DIAGNOSIS — R2689 Other abnormalities of gait and mobility: Secondary | ICD-10-CM | POA: Diagnosis not present

## 2016-12-15 DIAGNOSIS — R51 Headache: Secondary | ICD-10-CM | POA: Diagnosis not present

## 2016-12-15 DIAGNOSIS — G301 Alzheimer's disease with late onset: Secondary | ICD-10-CM | POA: Diagnosis not present

## 2016-12-22 ENCOUNTER — Other Ambulatory Visit: Payer: Self-pay

## 2016-12-22 DIAGNOSIS — D693 Immune thrombocytopenic purpura: Secondary | ICD-10-CM

## 2016-12-22 NOTE — Progress Notes (Signed)
[place]  [phone]  [phone]     Cache Decoursey DOB: 1933/04/29  MR#: [id number]  PNT#:[id number]  Patient Care Team: Idelle Crouch, MD as PCP - General (Internal Medicine) Alfonzo Feller, RN as [place] Management  CHIEF COMPLAINT: Chronic refractory ITP  INTERVAL HISTORY:  Patient returns to clinic today for repeat laboratory work and consideration of additional Nplate. She has increased weakness and fatigue in the past several days which she attributes to worsening diarrhea and poor appetite. She otherwise feels well.  She does not complain of easy bruising. She has no neurologic complaints. She denies any fevers. She has no chest pain or shortness of breath. She denies any nausea, vomiting, constipation, or diarrhea. She has no urinary complaints. Patient offers no further specific complaints today.  REVIEW OF SYSTEMS:   Review of Systems  Constitutional: Positive for malaise/fatigue. Negative for fever and weight loss.  HENT: Negative for congestion.   Respiratory: Negative.  Negative for shortness of breath.   Cardiovascular: Negative.  Negative for chest pain and leg swelling.  Gastrointestinal: Positive for diarrhea. Negative for abdominal pain, blood in stool, melena and nausea.  Genitourinary: Negative for dysuria and hematuria.  Musculoskeletal: Negative.   Skin: Negative for rash.  Neurological: Positive for weakness.  Endo/Heme/Allergies: Does not bruise/bleed easily.  Psychiatric/Behavioral: Negative.  The patient is not nervous/anxious.     As per HPI. Otherwise, a complete review of systems is negative.  PAST MEDICAL HISTORY: Past Medical History:  Diagnosis Date  . Anemia   . Anxiety   . Asthma   . Chronic back pain   . Depression   . Depression   . GERD (gastroesophageal reflux disease)   . Hiatal hernia   . Hypercholesteremia   . ITP (idiopathic thrombocytopenic purpura)     . Osteoarthritis   . Osteoporosis   . Pulmonary emboli ([place])   . Restless legs     PAST SURGICAL HISTORY: Past Surgical History:  Procedure Laterality Date  . CARPAL TUNNEL RELEASE    . ESOPHAGOGASTRODUODENOSCOPY (EGD) WITH PROPOFOL N/A 09/10/2015   Procedure: ESOPHAGOGASTRODUODENOSCOPY (EGD) WITH PROPOFOL;  Surgeon: Lollie Sails, MD;  Location: [facility] ENDOSCOPY;  Service: Endoscopy;  Laterality: N/A;  . LUMBAR [place]    . SPLENECTOMY, PARTIAL    . TOTAL ABDOMINAL HYSTERECTOMY      FAMILY HISTORY Family History  Problem Relation Age of Onset  . Stroke Mother     GYNECOLOGIC HISTORY:  No LMP recorded. Patient is postmenopausal.     ADVANCED DIRECTIVES:    HEALTH MAINTENANCE: Social History  Substance Use Topics  . Smoking status: Never Smoker  . Smokeless tobacco: Never Used  . Alcohol use No     Allergies  Allergen Reactions  . Aspirin     Other reaction(s): Distress (finding)  . Diazepam     Other reaction(s): Itching of Skin sneezing  . Morphine     Other reaction(s): Itching of Skin  . Other     Other reaction(s): Unknown seasonal allergies  . Tetanus Toxoids     Other reaction(s): Localized superficial swelling of skin    Current Outpatient Prescriptions  Medication Sig Dispense Refill  . acetaminophen (TYLENOL) 325 MG tablet Take 2 tablets (650 mg total) by mouth every 6 (six) hours as needed for mild pain (or Fever >/= 101). 100 tablet 0  . albuterol (PROVENTIL HFA;VENTOLIN HFA) 108 (90 BASE) MCG/ACT inhaler Inhale 2 puffs into the lungs  every 6 (six) hours as needed for wheezing or shortness of breath.    . ALPRAZolam (XANAX) 0.25 MG tablet TAKE 1 TABLET TWICE DAILY AS NEEDED FOR SLEEP    . BUDESONIDE-FORMOTEROL FUMARATE IN Inhale 2 puffs into the lungs 2 (two) times daily as needed (for shortness of breath and wheezing.).     Marland Kitchen buPROPion (WELLBUTRIN XL) 150 MG 24 hr tablet Take 1 tablet by mouth daily.    . Calcium Carbonate-Vitamin D  (CALCIUM 600+D) 600-400 MG-UNIT per tablet Take 1 tablet by mouth daily.    . chlorpheniramine-HYDROcodone (TUSSIONEX PENNKINETIC ER) 10-8 MG/5ML SUER Take 5 mLs by mouth 2 (two) times daily. 140 mL 0  . citalopram (CELEXA) 40 MG tablet Take 40 mg by mouth daily.    . Cyanocobalamin (RA VITAMIN B-12 TR) 1000 MCG TBCR Take 1,000 mcg by mouth daily.     Marland Kitchen donepezil (ARICEPT) 5 MG tablet Take 5 mg by mouth daily.    . Fluticasone-Salmeterol (ADVAIR) 250-50 MCG/DOSE AEPB Inhale 1 puff into the lungs 2 (two) times daily.    Marland Kitchen gabapentin (NEURONTIN) 300 MG capsule 300mg  orally 4 times a day    . HYDROcodone-acetaminophen (NORCO) 7.5-325 MG tablet 1 po tid prn Earliest Fill Date: 11/14/16    . levothyroxine (SYNTHROID, LEVOTHROID) 50 MCG tablet Take 50 mcg by mouth daily before breakfast. Take 30 to 60 minutes before breakfast.    . LORazepam (ATIVAN) 0.5 MG tablet Take 1 tablet (0.5 mg total) by mouth every 8 (eight) hours as needed for anxiety. 30 tablet 0  . Multiple Vitamins-Minerals (OCUVITE-LUTEIN PO) Take 1 capsule by mouth daily.     Marland Kitchen oxybutynin (DITROPAN) 5 MG tablet Take by mouth.    . pantoprazole (PROTONIX) 20 MG tablet Take 20 mg by mouth 2 (two) times daily.     . pravastatin (PRAVACHOL) 20 MG tablet Take 20 mg by mouth daily.    . traMADol (ULTRAM) 50 MG tablet Take 50 mg by mouth 3 (three) times daily as needed for moderate pain or severe pain. pain    . vitamin C (ASCORBIC ACID) 500 MG tablet Take 500 mg by mouth daily.    . vitamin E (E-400) 400 UNIT capsule Take 400 Units by mouth daily.    Marland Kitchen zolpidem (AMBIEN) 5 MG tablet Take 5 mg by mouth at bedtime as needed for sleep.    . memantine (NAMENDA) 5 MG tablet Take by mouth.     No current facility-administered medications for this visit.     OBJECTIVE: BP (!) 148/77 (BP Location: Left Arm, Patient Position: Sitting)   Pulse 66   Temp 97.6 F (36.4 C) (Tympanic)   Resp 18   Wt 140 lb 12.2 oz (63.9 kg)   BMI 23.42 kg/m    Body  mass index is 23.42 kg/m.    ECOG FS:1 - Symptomatic but completely ambulatory  General: Well-developed, well-nourished, no acute distress. Eyes: Pink conjunctiva, anicteric sclera. HEENT: Normocephalic, moist mucous membranes, clear oropharnyx. Lungs: Clear to auscultation bilaterally. Heart: Regular rate and rhythm. No rubs, murmurs, or gallops. Musculoskeletal: No edema, cyanosis, or clubbing. Neuro: Alert, answering all questions appropriately. Cranial nerves grossly intact. Skin: No petechiae noted. Bruising noted in various stages of healing. Psych: Normal affect.    LAB RESULTS:  Appointment on 12/23/2016  Component Date Value Ref Range Status  . WBC 12/23/2016 11.7* 3.6 - 11.0 K/uL Final  . RBC 12/23/2016 4.72  3.80 - 5.20 MIL/uL Final  . Hemoglobin 12/23/2016  13.7  12.0 - 16.0 g/dL Final  . HCT 12/23/2016 42.0  35.0 - 47.0 % Final  . MCV 12/23/2016 89.1  80.0 - 100.0 fL Final  . MCH 12/23/2016 29.1  26.0 - 34.0 pg Final  . MCHC 12/23/2016 32.7  32.0 - 36.0 g/dL Final  . RDW 12/23/2016 17.3* 11.5 - 14.5 % Final  . Platelets 12/23/2016 660* 150 - 440 K/uL Final  . Neutrophils Relative % 12/23/2016 70  % Final  . Neutro Abs 12/23/2016 8.2* 1.4 - 6.5 K/uL Final  . Lymphocytes Relative 12/23/2016 18  % Final  . Lymphs Abs 12/23/2016 2.1  1.0 - 3.6 K/uL Final  . Monocytes Relative 12/23/2016 10  % Final  . Monocytes Absolute 12/23/2016 1.1* 0.2 - 0.9 K/uL Final  . Eosinophils Relative 12/23/2016 1  % Final  . Eosinophils Absolute 12/23/2016 0.1  0 - 0.7 K/uL Final  . Basophils Relative 12/23/2016 1  % Final  . Basophils Absolute 12/23/2016 0.1  0 - 0.1 K/uL Final    STUDIES: No results found.  ASSESSMENT:  Chronic refractory ITP  PLAN:   1. Chronic refractory ITP: Patient had a poor response to Prednisone, WinRho, and Rituxan. She is status post splenectomy in 2007. Her platelet count Has significantly improved after her most recent injection of Nplate on December 09, 2016. She does not require 4 mcg/kg of Nplate today. Return to clinic in 2 weeks for laboratory work and consideration of Nplate and then in 1 month for laboratory work, further evaluation, and consideration of Nplate if her platelet count falls below 100. Patient appears to only requiring injections every 3-4 weeks. Can consider Promacta 12.5mg  at a later date if necessary. Max dose of Nplate is 10 mcg/kg. 2. Nausea: Resolved.  3. Diarrhea: Recommended patient follow-up with her GI physician..  Patient expressed understanding and was in agreement with this plan. She also understands that She can call clinic at any time with any questions, concerns, or complaints.    Lloyd Huger, MD   12/23/2016 12:54 PM

## 2016-12-23 ENCOUNTER — Inpatient Hospital Stay (HOSPITAL_BASED_OUTPATIENT_CLINIC_OR_DEPARTMENT_OTHER): Payer: Medicare HMO | Admitting: Oncology

## 2016-12-23 ENCOUNTER — Inpatient Hospital Stay: Payer: Medicare HMO

## 2016-12-23 ENCOUNTER — Inpatient Hospital Stay: Payer: Medicare HMO | Attending: Oncology

## 2016-12-23 VITALS — BP 148/77 | HR 66 | Temp 97.6°F | Resp 18 | Wt 140.8 lb

## 2016-12-23 DIAGNOSIS — M1990 Unspecified osteoarthritis, unspecified site: Secondary | ICD-10-CM | POA: Insufficient documentation

## 2016-12-23 DIAGNOSIS — G8929 Other chronic pain: Secondary | ICD-10-CM | POA: Diagnosis not present

## 2016-12-23 DIAGNOSIS — K219 Gastro-esophageal reflux disease without esophagitis: Secondary | ICD-10-CM | POA: Insufficient documentation

## 2016-12-23 DIAGNOSIS — R630 Anorexia: Secondary | ICD-10-CM | POA: Diagnosis not present

## 2016-12-23 DIAGNOSIS — Z9081 Acquired absence of spleen: Secondary | ICD-10-CM | POA: Insufficient documentation

## 2016-12-23 DIAGNOSIS — E7800 Pure hypercholesterolemia, unspecified: Secondary | ICD-10-CM | POA: Insufficient documentation

## 2016-12-23 DIAGNOSIS — R5381 Other malaise: Secondary | ICD-10-CM | POA: Insufficient documentation

## 2016-12-23 DIAGNOSIS — R197 Diarrhea, unspecified: Secondary | ICD-10-CM

## 2016-12-23 DIAGNOSIS — R531 Weakness: Secondary | ICD-10-CM | POA: Insufficient documentation

## 2016-12-23 DIAGNOSIS — R5383 Other fatigue: Secondary | ICD-10-CM | POA: Insufficient documentation

## 2016-12-23 DIAGNOSIS — F418 Other specified anxiety disorders: Secondary | ICD-10-CM

## 2016-12-23 DIAGNOSIS — Z780 Asymptomatic menopausal state: Secondary | ICD-10-CM | POA: Diagnosis not present

## 2016-12-23 DIAGNOSIS — Z86711 Personal history of pulmonary embolism: Secondary | ICD-10-CM | POA: Insufficient documentation

## 2016-12-23 DIAGNOSIS — M549 Dorsalgia, unspecified: Secondary | ICD-10-CM

## 2016-12-23 DIAGNOSIS — Z79899 Other long term (current) drug therapy: Secondary | ICD-10-CM | POA: Diagnosis not present

## 2016-12-23 DIAGNOSIS — D693 Immune thrombocytopenic purpura: Secondary | ICD-10-CM | POA: Diagnosis not present

## 2016-12-23 LAB — CBC WITH DIFFERENTIAL/PLATELET
BASOS ABS: 0.1 10*3/uL (ref 0–0.1)
BASOS PCT: 1 %
EOS ABS: 0.1 10*3/uL (ref 0–0.7)
Eosinophils Relative: 1 %
HCT: 42 % (ref 35.0–47.0)
HEMOGLOBIN: 13.7 g/dL (ref 12.0–16.0)
Lymphocytes Relative: 18 %
Lymphs Abs: 2.1 10*3/uL (ref 1.0–3.6)
MCH: 29.1 pg (ref 26.0–34.0)
MCHC: 32.7 g/dL (ref 32.0–36.0)
MCV: 89.1 fL (ref 80.0–100.0)
Monocytes Absolute: 1.1 10*3/uL — ABNORMAL HIGH (ref 0.2–0.9)
Monocytes Relative: 10 %
NEUTROS PCT: 70 %
Neutro Abs: 8.2 10*3/uL — ABNORMAL HIGH (ref 1.4–6.5)
Platelets: 660 10*3/uL — ABNORMAL HIGH (ref 150–440)
RBC: 4.72 MIL/uL (ref 3.80–5.20)
RDW: 17.3 % — ABNORMAL HIGH (ref 11.5–14.5)
WBC: 11.7 10*3/uL — AB (ref 3.6–11.0)

## 2016-12-23 NOTE — Progress Notes (Signed)
Complains of feeling weak and tired the past few days but feels better today.

## 2016-12-28 ENCOUNTER — Other Ambulatory Visit: Payer: Self-pay | Admitting: *Deleted

## 2016-12-28 ENCOUNTER — Encounter: Payer: Self-pay | Admitting: *Deleted

## 2016-12-28 NOTE — Patient Outreach (Signed)
[facility]) Care Management  12/28/2016  Ariana [name] 08-Jun-1933 [id number]   Telephone follow up for case closure   Placed call to patient,spoke with her husband, caregiver Ariana [name], he states patient is doing good, she is currently resting in bed at  this time.   Discussed patient has improved from her recent respiratory/bronchitis concern  denies increase in cough, no shortness of breath,has not required use of nebulizer in the last 1  weeks, but has it on hand if needed and understands how to use. Mr.Sahni reports he still assist patient with her medication organizations. He discussed patient recent visit with neurologist and recommendation for outpatient therapy to help with balance. Patient is able to tolerate continuing her walks outside around the house she does not currently use a cane and no recent falls,initial visit is scheduled for 4/5.  Mr.Kidane reports patient's appetite and intake is good.  Discussed outpatient senior center Mr.Arceneaux states she still does not want to participate  those type activities she likes working around outside her home and some interaction with family on Internet. Discussed Dementia specialist outreach, Mr.Arthur Holms states he has contacted and spoke with representative in the past and if new concerns arise he has her contact number.  Assessment  Denies any new issues or care concerns to be addressed at this time, discussed case closure and he is in agreement as goals have been met.  Mr.Insenberg has [facility] contact number and encouraged to contact for future needs.  Plan Will close case to community care management goals have been met.  Will send MD notification letter of case closure   Joylene Draft, RN, [place] Management [phone]- Mobile [phone]- Toll Free Main Office .

## 2016-12-30 DIAGNOSIS — H16223 Keratoconjunctivitis sicca, not specified as Sjogren's, bilateral: Secondary | ICD-10-CM | POA: Diagnosis not present

## 2017-01-06 ENCOUNTER — Inpatient Hospital Stay: Payer: Medicare HMO

## 2017-01-06 DIAGNOSIS — Z0000 Encounter for general adult medical examination without abnormal findings: Secondary | ICD-10-CM | POA: Diagnosis not present

## 2017-01-06 DIAGNOSIS — D693 Immune thrombocytopenic purpura: Secondary | ICD-10-CM

## 2017-01-06 DIAGNOSIS — E039 Hypothyroidism, unspecified: Secondary | ICD-10-CM | POA: Diagnosis not present

## 2017-01-06 DIAGNOSIS — I10 Essential (primary) hypertension: Secondary | ICD-10-CM | POA: Diagnosis not present

## 2017-01-06 DIAGNOSIS — G301 Alzheimer's disease with late onset: Secondary | ICD-10-CM | POA: Diagnosis not present

## 2017-01-06 DIAGNOSIS — Z9081 Acquired absence of spleen: Secondary | ICD-10-CM | POA: Diagnosis not present

## 2017-01-06 DIAGNOSIS — E7800 Pure hypercholesterolemia, unspecified: Secondary | ICD-10-CM | POA: Diagnosis not present

## 2017-01-06 DIAGNOSIS — R197 Diarrhea, unspecified: Secondary | ICD-10-CM | POA: Diagnosis not present

## 2017-01-06 DIAGNOSIS — R0781 Pleurodynia: Secondary | ICD-10-CM | POA: Diagnosis not present

## 2017-01-06 DIAGNOSIS — D473 Essential (hemorrhagic) thrombocythemia: Secondary | ICD-10-CM | POA: Diagnosis not present

## 2017-01-06 DIAGNOSIS — R630 Anorexia: Secondary | ICD-10-CM | POA: Diagnosis not present

## 2017-01-06 DIAGNOSIS — Z79899 Other long term (current) drug therapy: Secondary | ICD-10-CM | POA: Diagnosis not present

## 2017-01-06 DIAGNOSIS — Z780 Asymptomatic menopausal state: Secondary | ICD-10-CM | POA: Diagnosis not present

## 2017-01-06 DIAGNOSIS — R5381 Other malaise: Secondary | ICD-10-CM | POA: Diagnosis not present

## 2017-01-06 DIAGNOSIS — R531 Weakness: Secondary | ICD-10-CM | POA: Diagnosis not present

## 2017-01-06 DIAGNOSIS — R5383 Other fatigue: Secondary | ICD-10-CM | POA: Diagnosis not present

## 2017-01-06 DIAGNOSIS — K529 Noninfective gastroenteritis and colitis, unspecified: Secondary | ICD-10-CM | POA: Diagnosis not present

## 2017-01-06 LAB — CBC WITH DIFFERENTIAL/PLATELET
BASOS ABS: 0.1 10*3/uL (ref 0–0.1)
BASOS PCT: 1 %
Eosinophils Absolute: 0.2 10*3/uL (ref 0–0.7)
Eosinophils Relative: 1 %
HEMATOCRIT: 38.1 % (ref 35.0–47.0)
HEMOGLOBIN: 12.6 g/dL (ref 12.0–16.0)
LYMPHS PCT: 20 %
Lymphs Abs: 3 10*3/uL (ref 1.0–3.6)
MCH: 29.9 pg (ref 26.0–34.0)
MCHC: 33.2 g/dL (ref 32.0–36.0)
MCV: 90.2 fL (ref 80.0–100.0)
MONO ABS: 1.4 10*3/uL — AB (ref 0.2–0.9)
Monocytes Relative: 9 %
NEUTROS ABS: 10.1 10*3/uL — AB (ref 1.4–6.5)
NEUTROS PCT: 69 %
Platelets: 27 10*3/uL — CL (ref 150–400)
RBC: 4.22 MIL/uL (ref 3.80–5.20)
RDW: 17 % — AB (ref 11.5–14.5)
WBC: 14.7 10*3/uL — ABNORMAL HIGH (ref 3.6–11.0)

## 2017-01-06 MED ORDER — ROMIPLOSTIM INJECTION 500 MCG
400.0000 ug | Freq: Once | SUBCUTANEOUS | Status: AC
  Administered 2017-01-06: 400 ug via SUBCUTANEOUS
  Filled 2017-01-06: qty 0.8

## 2017-01-11 DIAGNOSIS — J4530 Mild persistent asthma, uncomplicated: Secondary | ICD-10-CM | POA: Diagnosis not present

## 2017-01-11 DIAGNOSIS — R0982 Postnasal drip: Secondary | ICD-10-CM | POA: Diagnosis not present

## 2017-01-11 DIAGNOSIS — R05 Cough: Secondary | ICD-10-CM | POA: Diagnosis not present

## 2017-01-14 ENCOUNTER — Ambulatory Visit: Payer: Medicare HMO | Attending: Neurology

## 2017-01-14 DIAGNOSIS — R262 Difficulty in walking, not elsewhere classified: Secondary | ICD-10-CM | POA: Diagnosis not present

## 2017-01-14 DIAGNOSIS — R2681 Unsteadiness on feet: Secondary | ICD-10-CM | POA: Insufficient documentation

## 2017-01-14 DIAGNOSIS — M6281 Muscle weakness (generalized): Secondary | ICD-10-CM | POA: Diagnosis not present

## 2017-01-14 NOTE — Therapy (Signed)
[place] MAIN [facility] SERVICES [address] [place], [place], [address] Phone: [phone]   Fax:  [phone]  Physical Therapy Evaluation  Patient Details  Name: Ariana [name] MRN: [id number] Date of Birth: [birth year] Referring Provider: Jennings Books MD  Encounter Date: 01/14/2017      PT End of Session - 01/14/17 1007    Visit Number 1   Number of Visits 16   Date for PT Re-Evaluation 03/11/17   Authorization Type 1 / 10 G code   PT Start Time 0900   PT Stop Time 1000   PT Time Calculation (min) 60 min   Equipment Utilized During Treatment Gait belt   Activity Tolerance Patient tolerated treatment well;No increased pain   Behavior During Therapy WFL for tasks assessed/performed      Past Medical History:  Diagnosis Date  . Anemia   . Anxiety   . Asthma   . Chronic back pain   . Depression   . Depression   . GERD (gastroesophageal reflux disease)   . Hiatal hernia   . Hypercholesteremia   . ITP (idiopathic thrombocytopenic purpura)   . Osteoarthritis   . Osteoporosis   . Pulmonary emboli ([place])   . Restless legs     Past Surgical History:  Procedure Laterality Date  . CARPAL TUNNEL RELEASE    . ESOPHAGOGASTRODUODENOSCOPY (EGD) WITH PROPOFOL N/A 09/10/2015   Procedure: ESOPHAGOGASTRODUODENOSCOPY (EGD) WITH PROPOFOL;  Surgeon: Lollie Sails, MD;  Location: [facility] ENDOSCOPY;  Service: Endoscopy;  Laterality: N/A;  . LUMBAR [place]    . SPLENECTOMY, PARTIAL    . TOTAL ABDOMINAL HYSTERECTOMY      There were no vitals filed for this visit.       Subjective Assessment - 01/14/17 0918    Subjective Patient is a 83 yo presenting with balance difficulties. Patient states difficulties with transfering from sitting to standing, walking without losing her balance, Patient states she ambulates with a cane at community distances but not inside the home, ascending/descending the stairs (must use handrail), Patient  reports she used to have difficulty with transferring in and out of the tub but now uses recently installed hand rail, Patient reports she has good and bad days (Depends on how Alzheimer's affecting her). Patient reports history of low back and neck pain. Bladder urgency reported.    Patient is accompained by: Family member   Pertinent History Patient presents with history of Alzheimer's Dz, back surgery, rotator cuff surgery   Limitations Standing;Walking;Lifting   How long can you stand comfortably? 28min   How long can you walk comfortably? 93min   Patient Stated Goals To get back to walking and better getting up and down out of a chair   Currently in Pain? No/denies            [facility] PT Assessment - 01/14/17 0915      Assessment   Medical Diagnosis Balance difficulties   Referring Provider Jennings Books MD   Onset Date/Surgical Date 10/12/14   Hand Dominance Right   Next MD Visit unknown   Prior Therapy none     Precautions   Precautions Fall     Balance Screen   Has the patient fallen in the past 6 months Yes   How many times? 2   Has the patient had a decrease in activity level because of a fear of falling?  No   Is the patient reluctant to leave their home because of a fear  of falling?  No     Home Ecologist residence     Prior Function   Level of Independence Needs assistance with ADLs  Secondary to [place] Retired   Biomedical scientist N/A   Leisure walking around yard     Cognition   Overall Cognitive Status History of cognitive impairments - at baseline     ROM / Strength   AROM / PROM / Strength Strength     Strength   Strength Assessment Site Hip;Knee;Ankle   Right/Left Hip Right;Left   Right Hip Flexion 4-/5   Right Hip ABduction 4-/5   Right Hip ADduction 4-/5   Left Hip Flexion 4/5   Left Hip ABduction 4-/5   Left Hip ADduction 4-/5   Right/Left Knee Right;Left   Right Knee Flexion 4-/5   Right  Knee Extension 4+/5   Left Knee Flexion 4/5   Left Knee Extension 5/5   Right/Left Ankle Right;Left   Right Ankle Dorsiflexion 4-/5   Left Ankle Dorsiflexion 4-/5     Ambulation/Gait   Ambulation/Gait Yes   Gait Pattern Decreased step length - left;Decreased step length - right;Step-through pattern   Gait Comments Patient demonstrates increased LOB when turning when ambulating     Balance   Balance Assessed Yes     Static Sitting Balance   Static Sitting - Balance Support --     Static Standing Balance   Static Standing - Balance Support No upper extremity supported   Static Standing - Level of Assistance 4: Min assist;5: Stand by assistance   Static Standing Balance -  Activities  Single Leg Stance - Right Leg;Single Leg Stance - Left Leg;Tandam Stance - Right Leg;Tandam Stance - Left Leg;Sharpened Romberg - Eyes Open;Sharpened Romberg - Eyes Closed   Static Standing - Comment/# of Minutes SLS R: 1 sec; SLS L 1sec; Tandem R: 1 sec; Tandem L:1 sec; Sharpened Rhomberg EO and EC : >20sec     Standardized Balance Assessment   Standardized Balance Assessment Timed Up and Go Test;10 meter walk test;Five Times Sit to Stand;Dynamic Gait Index   Five times sit to stand comments  22sec   10 Meter Walk 1.51m/s     Dynamic Gait Index   Level Surface Normal   Change in Gait Speed Mild Impairment   Gait with Horizontal Head Turns Mild Impairment   Gait with Vertical Head Turns Mild Impairment   Gait and Pivot Turn Normal   Step Over Obstacle Mild Impairment   Step Around Obstacles Mild Impairment   Steps Normal   Total Score 19     Timed Up and Go Test   Normal TUG (seconds) 12   Cognitive TUG (seconds) 16.2      TREATMENT: Sit to stands without use of UEs -- x 10 Tandem stance -- x 30 sec B SLS -- x 30 sec B Amb with head turns (R/L and Up/Down) -- x 30 ft   Patient demonstrates increased LOB with head turns when ambulating and requires minA to adjust weight balance          PT Education - 01/14/17 1007    Education provided Yes   Education Details HEP: sit to stands, tandem stance, SLS   Person(s) Educated Patient   Methods Explanation;Demonstration   Comprehension Verbalized understanding;Returned demonstration             PT Long Term Goals - 01/14/17 1013      PT LONG TERM  GOAL #1   Title Pt will score 24/24 on the DGI to demonstrate significant improvement in ambulation ability and decreased fall risk.   Baseline DGI: 19/24   Time 8   Period Weeks   Status New     PT LONG TERM GOAL #2   Title Patient will decreased cognitive and normal TUG below 10sec to demonstrate significant improvement in fall risk   Baseline normal TUG: 12sec; cognitive TUG: 16sec   Time 8   Period Weeks   Status New     PT LONG TERM GOAL #3   Title Patient will improve 5xSTS to below 12 to demonstrate significant improvement in functional LE strength and ability to stand out of a chair to amb to the restroom.    Baseline 5XSTS: 22sec   Time 8   Period Weeks   Status New     PT LONG TERM GOAL #4   Title Patient will be independent with HEP to continue with functional gains after discharge from PT.    Baseline dependent on form/technique   Time 8   Period Weeks   Status New               Plan - 01/14/17 1008    Clinical Impression Statement Pt is a 83 yo right hand dominant female presenting with increased balance difficulties most evident when ambulating. Patient's balancing difficulties are indicated by increased time to perform: 5xSTS and TUG; and decreased scores with the DGI and SLS/tandem stance test for time. Patient also demonstrate decreased LE strength as indicated by the 5xSTS and MMT. Patient will benefit from further skilled therapy focused on improving LE strength and walking balance to decreased fall risk.    Rehab Potential Fair   Clinical Impairments Affecting Rehab Potential (+) highly motivated, family support (-) Chroncity of  issue, Alzheimer's dz   PT Frequency 2x / week   PT Duration 8 weeks   PT Treatment/Interventions ADLs/Self Care Home Management;Gait training;Stair training;Functional mobility training;Therapeutic activities;Therapeutic exercise;Balance training;Patient/family education;Neuromuscular re-education;Energy conservation   PT Next Visit Plan Progress LE strengthening and balance exercise   PT Home Exercise Plan Sit to stands, SLS, tandem stance   Consulted and Agree with Plan of Care Patient;Family member/caregiver   Family Member Consulted Husband      Patient will benefit from skilled therapeutic intervention in order to improve the following deficits and impairments:  Abnormal gait, Decreased cognition, Impaired sensation, Decreased coordination, Decreased mobility, Postural dysfunction, Decreased strength, Decreased activity tolerance, Decreased balance, Difficulty walking  Visit Diagnosis: Muscle weakness (generalized) - Plan: PT plan of care cert/re-cert  Difficulty in walking, not elsewhere classified - Plan: PT plan of care cert/re-cert  Unsteadiness on feet - Plan: PT plan of care cert/re-cert      G-Codes - 16/10/96 1017    Functional Assessment Tool Used (Outpatient Only) TUG, 5xSTS, DGI, MMT, clinical judgement   Functional Limitation Mobility: Walking and moving around   Mobility: Walking and Moving Around Current Status (E4540) At least 20 percent but less than 40 percent impaired, limited or restricted   Mobility: Walking and Moving Around Goal Status [phone]) At least 1 percent but less than 20 percent impaired, limited or restricted       Problem List Patient Active Problem List   Diagnosis Date Noted  . Back muscle spasm 09/09/2015  . Clinical depression 04/12/2015  . Herpes zona 04/12/2015  . Chronic ITP (idiopathic thrombocytopenic purpura) (HCC) 03/15/2015  . Chest pain 10/22/2014  . Breathlessness on  exertion 10/22/2014  . Osteopenia 10/18/2014  . Benign  essential HTN 06/28/2014  . HLD (hyperlipidemia) 06/28/2014  . Acquired hypothyroidism 06/04/2014  . Anxiety and depression 06/04/2014  . Bursitis, trochanteric 03/27/2014  . DDD (degenerative disc disease), lumbar 02/27/2014  . Neuritis or radiculitis due to rupture of lumbar intervertebral disc 02/27/2014    Blythe Stanford, PT DPT 01/14/2017, 10:19 AM  [place] MAIN [facility] SERVICES [place], [place], [address] Phone: [phone]   Fax:  [phone]  Name: Ariana [name] MRN: [id number] Date of Birth: 03/31/1933

## 2017-01-18 ENCOUNTER — Ambulatory Visit: Payer: Medicare HMO

## 2017-01-18 DIAGNOSIS — M6281 Muscle weakness (generalized): Secondary | ICD-10-CM | POA: Diagnosis not present

## 2017-01-18 DIAGNOSIS — R262 Difficulty in walking, not elsewhere classified: Secondary | ICD-10-CM | POA: Diagnosis not present

## 2017-01-18 DIAGNOSIS — R2681 Unsteadiness on feet: Secondary | ICD-10-CM | POA: Diagnosis not present

## 2017-01-18 NOTE — Therapy (Signed)
[place] MAIN [facility] SERVICES [address] [place], [place], [address] Phone: [phone]   Fax:  [phone]  Physical Therapy Treatment  Patient Details  Name: Ariana [name] MRN: [id number] Date of Birth: 10/23/1932 Referring Provider: Jennings Books MD  Encounter Date: 01/18/2017      PT End of Session - 01/18/17 0959    Visit Number 2   Number of Visits 16   Date for PT Re-Evaluation 03/11/17   Authorization Type 2 / 10 G code   PT Start Time 0930   PT Stop Time 1015   PT Time Calculation (min) 45 min   Equipment Utilized During Treatment Gait belt   Activity Tolerance Patient tolerated treatment well;No increased pain   Behavior During Therapy WFL for tasks assessed/performed      Past Medical History:  Diagnosis Date  . Anemia   . Anxiety   . Asthma   . Chronic back pain   . Depression   . Depression   . GERD (gastroesophageal reflux disease)   . Hiatal hernia   . Hypercholesteremia   . ITP (idiopathic thrombocytopenic purpura)   . Osteoarthritis   . Osteoporosis   . Pulmonary emboli ([place])   . Restless legs     Past Surgical History:  Procedure Laterality Date  . CARPAL TUNNEL RELEASE    . ESOPHAGOGASTRODUODENOSCOPY (EGD) WITH PROPOFOL N/A 09/10/2015   Procedure: ESOPHAGOGASTRODUODENOSCOPY (EGD) WITH PROPOFOL;  Surgeon: Lollie Sails, MD;  Location: [facility] ENDOSCOPY;  Service: Endoscopy;  Laterality: N/A;  . LUMBAR [place]    . SPLENECTOMY, PARTIAL    . TOTAL ABDOMINAL HYSTERECTOMY      There were no vitals filed for this visit.      Subjective Assessment - 01/18/17 0938    Subjective Patient reports she continues to have good days and bad days, reports yesterday was a bad day and she states she didn't feel like getting out of bed to go to church.   Patient is accompained by: Family member   Pertinent History Patient presents with history of Alzheimer's Dz, back surgery, rotator cuff surgery    Limitations Standing;Walking;Lifting   How long can you stand comfortably? 22min   How long can you walk comfortably? 37min   Patient Stated Goals To get back to walking and better getting up and down out of a chair      TREATMENT: Nustep with chair at level 8 - resistance level 2-3 for 5 min with cueing on speed Monster walks with GTB around her knees - 4 x 79ft Tandem amb with intermittent step outs for support - 4 x 20ft  SLS in  bars with intermittent UE support --2 x 30 sec B Sit to stands without UE support - 2 x 15 Hip abduction with UE support - 2 x 15  Marches in  bars - x 20 with UE support  Patient performs cognitive tasks throughout treatment.        PT Education - 01/18/17 0954    Education provided Yes   Education Details Form/technique with exercise   Person(s) Educated Patient   Methods Explanation;Demonstration   Comprehension Verbalized understanding;Returned demonstration             PT Long Term Goals - 01/14/17 1013      PT LONG TERM GOAL #1   Title Pt will score 24/24 on the DGI to demonstrate significant improvement in ambulation ability and decreased fall risk.   Baseline DGI: 19/24  Time 8   Period Weeks   Status New     PT LONG TERM GOAL #2   Title Patient will decreased cognitive and normal TUG below 10sec to demonstrate significant improvement in fall risk   Baseline normal TUG: 12sec; cognitive TUG: 16sec   Time 8   Period Weeks   Status New     PT LONG TERM GOAL #3   Title Patient will improve 5xSTS to below 12 to demonstrate significant improvement in functional LE strength and ability to stand out of a chair to amb to the restroom.    Baseline 5XSTS: 22sec   Time 8   Period Weeks   Status New     PT LONG TERM GOAL #4   Title Patient will be independent with HEP to continue with functional gains after discharge from PT.    Baseline dependent on form/technique   Time 8   Period Weeks   Status New                Plan - 01/18/17 1010    Clinical Impression Statement Pt demonstrates increased difficulty with exercises especially when performing coginitive tasks indicating decreased cooridination and muscular strength/balance. Patient require frequent cueing thrioughout session secondary to coginitive impairments and patient will benefit from further skilled therapy to prior level of function   Rehab Potential Fair   Clinical Impairments Affecting Rehab Potential (+) highly motivated, family support (-) Chroncity of issue, Alzheimer's dz   PT Frequency 2x / week   PT Duration 8 weeks   PT Treatment/Interventions ADLs/Self Care Home Management;Gait training;Stair training;Functional mobility training;Therapeutic activities;Therapeutic exercise;Balance training;Patient/family education;Neuromuscular re-education;Energy conservation   PT Next Visit Plan Progress LE strengthening and balance exercise   PT Home Exercise Plan Sit to stands, SLS, tandem stance   Consulted and Agree with Plan of Care Patient;Family member/caregiver   Family Member Consulted Husband      Patient will benefit from skilled therapeutic intervention in order to improve the following deficits and impairments:  Abnormal gait, Decreased cognition, Impaired sensation, Decreased coordination, Decreased mobility, Postural dysfunction, Decreased strength, Decreased activity tolerance, Decreased balance, Difficulty walking  Visit Diagnosis: Muscle weakness (generalized)  Difficulty in walking, not elsewhere classified  Unsteadiness on feet     Problem List Patient Active Problem List   Diagnosis Date Noted  . Back muscle spasm 09/09/2015  . Clinical depression 04/12/2015  . Herpes zona 04/12/2015  . Chronic ITP (idiopathic thrombocytopenic purpura) (HCC) 03/15/2015  . Chest pain 10/22/2014  . Breathlessness on exertion 10/22/2014  . Osteopenia 10/18/2014  . Benign essential HTN 06/28/2014  . HLD (hyperlipidemia) 06/28/2014   . Acquired hypothyroidism 06/04/2014  . Anxiety and depression 06/04/2014  . Bursitis, trochanteric 03/27/2014  . DDD (degenerative disc disease), lumbar 02/27/2014  . Neuritis or radiculitis due to rupture of lumbar intervertebral disc 02/27/2014    Blythe Stanford, PT DPT 01/18/2017, 10:23 AM  [place] MAIN [facility] SERVICES [address] [place], [place], [address] Phone: [phone]   Fax:  [phone]  Name: Ariana [name] MRN: [id number] Date of Birth: 08/31/1933

## 2017-01-20 ENCOUNTER — Ambulatory Visit: Payer: Medicare HMO | Admitting: Oncology

## 2017-01-20 ENCOUNTER — Inpatient Hospital Stay: Payer: Medicare HMO

## 2017-01-20 ENCOUNTER — Inpatient Hospital Stay: Payer: Medicare HMO | Attending: Oncology

## 2017-01-20 DIAGNOSIS — G8929 Other chronic pain: Secondary | ICD-10-CM | POA: Insufficient documentation

## 2017-01-20 DIAGNOSIS — F418 Other specified anxiety disorders: Secondary | ICD-10-CM | POA: Diagnosis not present

## 2017-01-20 DIAGNOSIS — D693 Immune thrombocytopenic purpura: Secondary | ICD-10-CM | POA: Diagnosis not present

## 2017-01-20 DIAGNOSIS — E7800 Pure hypercholesterolemia, unspecified: Secondary | ICD-10-CM | POA: Diagnosis not present

## 2017-01-20 DIAGNOSIS — M1990 Unspecified osteoarthritis, unspecified site: Secondary | ICD-10-CM | POA: Diagnosis not present

## 2017-01-20 DIAGNOSIS — Z79899 Other long term (current) drug therapy: Secondary | ICD-10-CM | POA: Diagnosis not present

## 2017-01-20 DIAGNOSIS — Z86711 Personal history of pulmonary embolism: Secondary | ICD-10-CM | POA: Insufficient documentation

## 2017-01-20 DIAGNOSIS — K219 Gastro-esophageal reflux disease without esophagitis: Secondary | ICD-10-CM | POA: Diagnosis not present

## 2017-01-20 DIAGNOSIS — G2581 Restless legs syndrome: Secondary | ICD-10-CM | POA: Insufficient documentation

## 2017-01-20 DIAGNOSIS — Z780 Asymptomatic menopausal state: Secondary | ICD-10-CM | POA: Insufficient documentation

## 2017-01-20 DIAGNOSIS — Z9081 Acquired absence of spleen: Secondary | ICD-10-CM | POA: Diagnosis not present

## 2017-01-20 DIAGNOSIS — M549 Dorsalgia, unspecified: Secondary | ICD-10-CM | POA: Diagnosis not present

## 2017-01-20 LAB — CBC WITH DIFFERENTIAL/PLATELET
Basophils Absolute: 0.2 10*3/uL — ABNORMAL HIGH (ref 0–0.1)
Basophils Relative: 2 %
EOS PCT: 2 %
Eosinophils Absolute: 0.3 10*3/uL (ref 0–0.7)
HEMATOCRIT: 38.8 % (ref 35.0–47.0)
HEMOGLOBIN: 12.6 g/dL (ref 12.0–16.0)
LYMPHS ABS: 2.9 10*3/uL (ref 1.0–3.6)
LYMPHS PCT: 23 %
MCH: 29.3 pg (ref 26.0–34.0)
MCHC: 32.6 g/dL (ref 32.0–36.0)
MCV: 89.8 fL (ref 80.0–100.0)
Monocytes Absolute: 1.1 10*3/uL — ABNORMAL HIGH (ref 0.2–0.9)
Monocytes Relative: 9 %
NEUTROS ABS: 8 10*3/uL — AB (ref 1.4–6.5)
NEUTROS PCT: 64 %
Platelets: 211 10*3/uL (ref 150–400)
RBC: 4.32 MIL/uL (ref 3.80–5.20)
RDW: 16.8 % — ABNORMAL HIGH (ref 11.5–14.5)
WBC: 12.5 10*3/uL — AB (ref 3.6–11.0)

## 2017-01-20 MED ORDER — ROMIPLOSTIM INJECTION 500 MCG
400.0000 ug | Freq: Once | SUBCUTANEOUS | Status: AC
  Administered 2017-01-20: 400 ug via SUBCUTANEOUS
  Filled 2017-01-20: qty 0.8

## 2017-01-21 ENCOUNTER — Ambulatory Visit: Payer: Medicare HMO

## 2017-01-21 DIAGNOSIS — R262 Difficulty in walking, not elsewhere classified: Secondary | ICD-10-CM

## 2017-01-21 DIAGNOSIS — M6281 Muscle weakness (generalized): Secondary | ICD-10-CM | POA: Diagnosis not present

## 2017-01-21 DIAGNOSIS — R2681 Unsteadiness on feet: Secondary | ICD-10-CM

## 2017-01-21 NOTE — Therapy (Signed)
[place] MAIN [facility] SERVICES [address] [place], [place], [address] Phone: [phone]   Fax:  [phone]  Physical Therapy Treatment  Patient Details  Name: Ariana [name] MRN: [id number] Date of Birth: 05-Aug-1933 Referring Provider: Jennings Books MD  Encounter Date: 01/21/2017      PT End of Session - 01/21/17 0856    Visit Number 3   Number of Visits 16   Date for PT Re-Evaluation 03/11/17   Authorization Type 3 / 10 G code   PT Start Time 8101   PT Stop Time 0930   PT Time Calculation (min) 43 min   Equipment Utilized During Treatment Gait belt   Activity Tolerance Patient tolerated treatment well;No increased pain   Behavior During Therapy WFL for tasks assessed/performed      Past Medical History:  Diagnosis Date  . Anemia   . Anxiety   . Asthma   . Chronic back pain   . Depression   . Depression   . GERD (gastroesophageal reflux disease)   . Hiatal hernia   . Hypercholesteremia   . ITP (idiopathic thrombocytopenic purpura)   . Osteoarthritis   . Osteoporosis   . Pulmonary emboli ([place])   . Restless legs     Past Surgical History:  Procedure Laterality Date  . CARPAL TUNNEL RELEASE    . ESOPHAGOGASTRODUODENOSCOPY (EGD) WITH PROPOFOL N/A 09/10/2015   Procedure: ESOPHAGOGASTRODUODENOSCOPY (EGD) WITH PROPOFOL;  Surgeon: Lollie Sails, MD;  Location: [facility] ENDOSCOPY;  Service: Endoscopy;  Laterality: N/A;  . LUMBAR [place]    . SPLENECTOMY, PARTIAL    . TOTAL ABDOMINAL HYSTERECTOMY      There were no vitals filed for this visit.      Subjective Assessment - 01/21/17 0853    Subjective Patient reports she has good days and bad days and wants to go to church this Sunday.   Patient is accompained by: Family member   Pertinent History Patient presents with history of Alzheimer's Dz, back surgery, rotator cuff surgery   Limitations Standing;Walking;Lifting   How long can you stand comfortably?  3min   How long can you walk comfortably? 56min   Patient Stated Goals To get back to walking and better getting up and down out of a chair   Currently in Pain? No/denies      TREATMENT: Nustep with chair at level 8 - resistance level 4 for 8 min with cueing on speed Steps forward/backward over half foam - x20 Semi tandem on airex pad - 2 x 45sec B Side stepping up and over airex pad - x 20  Standing hip abduction in standing - x 20 with UE support Heel/toe raises in standing - x 20 on half foam roller (flat side up) Sit to stands without UE support - x 20  Tandem amb with intermittent step outs for support - 2 x 68ft  Karaoke side stepping over step - 2 x 63ft      PT Education - 01/21/17 0856    Education provided Yes   Education Details Form/technique with exercise    Person(s) Educated Patient   Methods Explanation;Demonstration   Comprehension Verbalized understanding;Returned demonstration             PT Long Term Goals - 01/14/17 1013      PT LONG TERM GOAL #1   Title Pt will score 24/24 on the DGI to demonstrate significant improvement in ambulation ability and decreased fall risk.   Baseline DGI:  19/24   Time 8   Period Weeks   Status New     PT LONG TERM GOAL #2   Title Patient will decreased cognitive and normal TUG below 10sec to demonstrate significant improvement in fall risk   Baseline normal TUG: 12sec; cognitive TUG: 16sec   Time 8   Period Weeks   Status New     PT LONG TERM GOAL #3   Title Patient will improve 5xSTS to below 12 to demonstrate significant improvement in functional LE strength and ability to stand out of a chair to amb to the restroom.    Baseline 5XSTS: 22sec   Time 8   Period Weeks   Status New     PT LONG TERM GOAL #4   Title Patient will be independent with HEP to continue with functional gains after discharge from PT.    Baseline dependent on form/technique   Time 8   Period Weeks   Status New                Plan - 01/21/17 3419    Clinical Impression Statement Pt demonstrates improvement with balancing and stabilization exercises indicating functional improvement and carryover between visits. Patient has tendency to loss balance posteriorly and requires frequent cueing to correct. Patient will benefit from further skilled therapy to decrease fall risk.    Rehab Potential Fair   Clinical Impairments Affecting Rehab Potential (+) highly motivated, family support (-) Chroncity of issue, Alzheimer's dz   PT Frequency 2x / week   PT Duration 8 weeks   PT Treatment/Interventions ADLs/Self Care Home Management;Gait training;Stair training;Functional mobility training;Therapeutic activities;Therapeutic exercise;Balance training;Patient/family education;Neuromuscular re-education;Energy conservation   PT Next Visit Plan Progress LE strengthening and balance exercise   PT Home Exercise Plan Sit to stands, SLS, tandem stance   Consulted and Agree with Plan of Care Patient;Family member/caregiver   Family Member Consulted Husband      Patient will benefit from skilled therapeutic intervention in order to improve the following deficits and impairments:  Abnormal gait, Decreased cognition, Impaired sensation, Decreased coordination, Decreased mobility, Postural dysfunction, Decreased strength, Decreased activity tolerance, Decreased balance, Difficulty walking  Visit Diagnosis: Muscle weakness (generalized)  Difficulty in walking, not elsewhere classified  Unsteadiness on feet     Problem List Patient Active Problem List   Diagnosis Date Noted  . Back muscle spasm 09/09/2015  . Clinical depression 04/12/2015  . Herpes zona 04/12/2015  . Chronic ITP (idiopathic thrombocytopenic purpura) (HCC) 03/15/2015  . Chest pain 10/22/2014  . Breathlessness on exertion 10/22/2014  . Osteopenia 10/18/2014  . Benign essential HTN 06/28/2014  . HLD (hyperlipidemia) 06/28/2014  . Acquired hypothyroidism  06/04/2014  . Anxiety and depression 06/04/2014  . Bursitis, trochanteric 03/27/2014  . DDD (degenerative disc disease), lumbar 02/27/2014  . Neuritis or radiculitis due to rupture of lumbar intervertebral disc 02/27/2014    Blythe Stanford, PT DPT 01/21/2017, 9:46 AM  [place] MAIN [facility] SERVICES [place], [place], [address] Phone: [phone]   Fax:  [phone]  Name: Corleen Otwell MRN: [id number] Date of Birth: [birth year]

## 2017-01-25 ENCOUNTER — Ambulatory Visit: Payer: Medicare HMO

## 2017-01-25 DIAGNOSIS — M6281 Muscle weakness (generalized): Secondary | ICD-10-CM | POA: Diagnosis not present

## 2017-01-25 DIAGNOSIS — R2681 Unsteadiness on feet: Secondary | ICD-10-CM | POA: Diagnosis not present

## 2017-01-25 DIAGNOSIS — R262 Difficulty in walking, not elsewhere classified: Secondary | ICD-10-CM | POA: Diagnosis not present

## 2017-01-25 NOTE — Therapy (Signed)
[place] MAIN [facility] SERVICES [address] [place], [place], [address] Phone: [phone]   Fax:  [phone]  Physical Therapy Treatment  Patient Details  Name: Ariana [name] MRN: [id number] Date of Birth: 08/28/1933 Referring Provider: Jennings Books MD  Encounter Date: 01/25/2017      PT End of Session - 01/25/17 0942    Visit Number 4   Number of Visits 16   Date for PT Re-Evaluation 03/11/17   Authorization Type 4 / 10 G code   PT Start Time 0930   PT Stop Time 1015   PT Time Calculation (min) 45 min   Equipment Utilized During Treatment Gait belt   Activity Tolerance Patient tolerated treatment well;No increased pain   Behavior During Therapy WFL for tasks assessed/performed      Past Medical History:  Diagnosis Date  . Anemia   . Anxiety   . Asthma   . Chronic back pain   . Depression   . Depression   . GERD (gastroesophageal reflux disease)   . Hiatal hernia   . Hypercholesteremia   . ITP (idiopathic thrombocytopenic purpura)   . Osteoarthritis   . Osteoporosis   . Pulmonary emboli ([place])   . Restless legs     Past Surgical History:  Procedure Laterality Date  . CARPAL TUNNEL RELEASE    . ESOPHAGOGASTRODUODENOSCOPY (EGD) WITH PROPOFOL N/A 09/10/2015   Procedure: ESOPHAGOGASTRODUODENOSCOPY (EGD) WITH PROPOFOL;  Surgeon: Lollie Sails, MD;  Location: [facility] ENDOSCOPY;  Service: Endoscopy;  Laterality: N/A;  . LUMBAR [place]    . SPLENECTOMY, PARTIAL    . TOTAL ABDOMINAL HYSTERECTOMY      There were no vitals filed for this visit.      Subjective Assessment - 01/25/17 0939    Subjective Patient reports she fell back into a doorknob in her house but denies any injury other than a contusion on her buttocks. Patient denies hitting her head.    Patient is accompained by: Family member   Pertinent History Patient presents with history of Alzheimer's Dz, back surgery, rotator cuff surgery   Limitations Standing;Walking;Lifting   How long can you stand comfortably? 58min   How long can you walk comfortably? 20min   Patient Stated Goals To get back to walking and better getting up and down out of a chair   Currently in Pain? No/denies       TREATMENT: Nustep with chair at level 8 - resistance level 4 for 8 min with cueing on speed Tandem amb with intermittent step outs for support - x10 down and back  bars  Side stepping along Airex beam - x10 down and back in  bars Ambulating down and back at MATRIX while facing forward and laterally - x5 each direction with 7.5# Forward amb with resistance on black tubing posteriorly - 4 x 44ft Backwards walking with focus on taking large steps with leaning forward - 4 x 26ft         PT Education - 01/25/17 0941    Education provided Yes   Education Details form/technique with exercise   Person(s) Educated Patient   Methods Explanation;Demonstration   Comprehension Verbalized understanding;Returned demonstration             PT Long Term Goals - 01/14/17 1013      PT LONG TERM GOAL #1   Title Pt will score 24/24 on the DGI to demonstrate significant improvement in ambulation ability and decreased fall risk.   Baseline  DGI: 19/24   Time 8   Period Weeks   Status New     PT LONG TERM GOAL #2   Title Patient will decreased cognitive and normal TUG below 10sec to demonstrate significant improvement in fall risk   Baseline normal TUG: 12sec; cognitive TUG: 16sec   Time 8   Period Weeks   Status New     PT LONG TERM GOAL #3   Title Patient will improve 5xSTS to below 12 to demonstrate significant improvement in functional LE strength and ability to stand out of a chair to amb to the restroom.    Baseline 5XSTS: 22sec   Time 8   Period Weeks   Status New     PT LONG TERM GOAL #4   Title Patient will be independent with HEP to continue with functional gains after discharge from PT.    Baseline dependent on  form/technique   Time 8   Period Weeks   Status New               Plan - 01/25/17 0945    Clinical Impression Statement Pt demonstrates decreased dynamic balance demonstrating increased postural sway with performing narrow BOS exercises while moving. Patient demonstrates increased posterior swaying when performing balance activities and patient will benefit from further skilled therapy to return to prior level of function.    Rehab Potential Fair   Clinical Impairments Affecting Rehab Potential (+) highly motivated, family support (-) Chroncity of issue, Alzheimer's dz   PT Frequency 2x / week   PT Duration 8 weeks   PT Treatment/Interventions ADLs/Self Care Home Management;Gait training;Stair training;Functional mobility training;Therapeutic activities;Therapeutic exercise;Balance training;Patient/family education;Neuromuscular re-education;Energy conservation   PT Next Visit Plan Progress LE strengthening and balance exercise   PT Home Exercise Plan Sit to stands, SLS, tandem stance   Consulted and Agree with Plan of Care Patient;Family member/caregiver   Family Member Consulted Husband      Patient will benefit from skilled therapeutic intervention in order to improve the following deficits and impairments:  Abnormal gait, Decreased cognition, Impaired sensation, Decreased coordination, Decreased mobility, Postural dysfunction, Decreased strength, Decreased activity tolerance, Decreased balance, Difficulty walking  Visit Diagnosis: Muscle weakness (generalized)  Difficulty in walking, not elsewhere classified  Unsteadiness on feet     Problem List Patient Active Problem List   Diagnosis Date Noted  . Back muscle spasm 09/09/2015  . Clinical depression 04/12/2015  . Herpes zona 04/12/2015  . Chronic ITP (idiopathic thrombocytopenic purpura) (HCC) 03/15/2015  . Chest pain 10/22/2014  . Breathlessness on exertion 10/22/2014  . Osteopenia 10/18/2014  . Benign  essential HTN 06/28/2014  . HLD (hyperlipidemia) 06/28/2014  . Acquired hypothyroidism 06/04/2014  . Anxiety and depression 06/04/2014  . Bursitis, trochanteric 03/27/2014  . DDD (degenerative disc disease), lumbar 02/27/2014  . Neuritis or radiculitis due to rupture of lumbar intervertebral disc 02/27/2014    Blythe Stanford, PT DPT 01/25/2017, 10:36 AM  [place] MAIN [facility] SERVICES [place], [place], [address] Phone: [phone]   Fax:  [phone]  Name: Ariana [name] MRN: [id number] Date of Birth: 10/26/1932

## 2017-01-27 ENCOUNTER — Ambulatory Visit: Payer: Medicare HMO

## 2017-01-28 DIAGNOSIS — L03114 Cellulitis of left upper limb: Secondary | ICD-10-CM | POA: Diagnosis not present

## 2017-01-28 DIAGNOSIS — M7022 Olecranon bursitis, left elbow: Secondary | ICD-10-CM | POA: Diagnosis not present

## 2017-02-01 ENCOUNTER — Ambulatory Visit: Payer: Medicare HMO | Admitting: Physical Therapy

## 2017-02-01 ENCOUNTER — Encounter: Payer: Self-pay | Admitting: Physical Therapy

## 2017-02-01 VITALS — BP 150/78 | HR 58

## 2017-02-01 DIAGNOSIS — M6281 Muscle weakness (generalized): Secondary | ICD-10-CM | POA: Diagnosis not present

## 2017-02-01 DIAGNOSIS — R2681 Unsteadiness on feet: Secondary | ICD-10-CM

## 2017-02-01 DIAGNOSIS — R262 Difficulty in walking, not elsewhere classified: Secondary | ICD-10-CM

## 2017-02-01 NOTE — Therapy (Signed)
[place] MAIN [facility] SERVICES [address] [place], [place], [address] Phone: [phone]   Fax:  [phone]  Physical Therapy Treatment  Patient Details  Name: Ariana [name] MRN: [id number] Date of Birth: 1933/01/15 Referring Provider: Jennings Books MD  Encounter Date: 02/01/2017      PT End of Session - 02/01/17 0847    Visit Number 5   Number of Visits 16   Date for PT Re-Evaluation 03/11/17   Authorization Type 5/10 G code   PT Start Time 0846   PT Stop Time 0931   PT Time Calculation (min) 45 min   Equipment Utilized During Treatment Gait belt   Activity Tolerance Patient tolerated treatment well;No increased pain   Behavior During Therapy WFL for tasks assessed/performed      Past Medical History:  Diagnosis Date  . Anemia   . Anxiety   . Asthma   . Chronic back pain   . Depression   . Depression   . GERD (gastroesophageal reflux disease)   . Hiatal hernia   . Hypercholesteremia   . ITP (idiopathic thrombocytopenic purpura)   . Osteoarthritis   . Osteoporosis   . Pulmonary emboli ([place])   . Restless legs     Past Surgical History:  Procedure Laterality Date  . CARPAL TUNNEL RELEASE    . ESOPHAGOGASTRODUODENOSCOPY (EGD) WITH PROPOFOL N/A 09/10/2015   Procedure: ESOPHAGOGASTRODUODENOSCOPY (EGD) WITH PROPOFOL;  Surgeon: Lollie Sails, MD;  Location: [facility] ENDOSCOPY;  Service: Endoscopy;  Laterality: N/A;  . LUMBAR [place]    . SPLENECTOMY, PARTIAL    . TOTAL ABDOMINAL HYSTERECTOMY      Vitals:   02/01/17 0849  BP: (!) 150/78  Pulse: (!) 58        Subjective Assessment - 02/01/17 0849    Subjective Pt denies any falls since last session.  She reports she has good days and bad days and today is somewhere in the middle.  Pt denies any pain.   Patient is accompained by: Family member   Pertinent History Patient presents with history of Alzheimer's Dz, back surgery, rotator cuff surgery   Limitations Standing;Walking;Lifting   How long can you stand comfortably? 69min   How long can you walk comfortably? 34min   Patient Stated Goals To get back to walking and better getting up and down out of a chair   Currently in Pain? No/denies       TREATMENT:   Neuromuscular Re-ed:  Tandem ambulating x9 lengths in // bars  Rhomberg stance 2x1 min with eyes closed with mild instability noted  Walking on balance stones x6 lengths in // bars, very challenging for pt and pt frequently reaching out for // bars for support.  Rocking forward and backward on  foam roll with flat surface up x1 minute. Cues to lean forward when rocking back as pt has tendency to lose her balance posteriorly. Balance much improved following these verbal cues.  Alternating toe taps up to 8" step from airex x20 each LE with intermittent hand hold on // bars    Therapeutic Exercise:  Ambulating down and back at MATRIX while facing forward and laterally - x2 each direction with 7.5#  Diagonal stepping forward and back over 2x4 wooden plank 4x12 ft  Noted that pt has difficulty with balance when performing sit>stand at beginning of session. Therfore practiced Sit<>stand x1 from chair. Cues to scoot to edge of seat and to bend knees prior to standing while pushing  from armrests and then reaching back for armrests when preparing to sit.  BLE leg press 90# 2x10 with cues for eccentric control.             PT Education - 02/01/17 0846    Education provided Yes   Education Details Exercise technique   Person(s) Educated Patient   Methods Explanation;Demonstration   Comprehension Returned demonstration;Verbalized understanding;Need further instruction             PT Long Term Goals - 01/14/17 1013      PT LONG TERM GOAL #1   Title Pt will score 24/24 on the DGI to demonstrate significant improvement in ambulation ability and decreased fall risk.   Baseline DGI: 19/24   Time 8   Period Weeks    Status New     PT LONG TERM GOAL #2   Title Patient will decreased cognitive and normal TUG below 10sec to demonstrate significant improvement in fall risk   Baseline normal TUG: 12sec; cognitive TUG: 16sec   Time 8   Period Weeks   Status New     PT LONG TERM GOAL #3   Title Patient will improve 5xSTS to below 12 to demonstrate significant improvement in functional LE strength and ability to stand out of a chair to amb to the restroom.    Baseline 5XSTS: 22sec   Time 8   Period Weeks   Status New     PT LONG TERM GOAL #4   Title Patient will be independent with HEP to continue with functional gains after discharge from PT.    Baseline dependent on form/technique   Time 8   Period Weeks   Status New               Plan - 02/01/17 2637    Clinical Impression Statement Pt does well with static balance activities, only requiring min guard assist with rhomberg stance with eyes closed.  However, she demonstrates balance impairments with dynamic activities, especially with anterior posteroir challenges.  This improved with verbal cues to shift weight anteriroly when posteriorly rocking on 1/2 foam.  She demonstrates impaired balance and poor safety with sit>stand at start of session so this was addressed with introduction of safe techinque.  She will benefit from continued skilled PT interventions for improved balance, strength, and QOL.   Rehab Potential Fair   Clinical Impairments Affecting Rehab Potential (+) highly motivated, family support (-) Chroncity of issue, Alzheimer's dz   PT Frequency 2x / week   PT Duration 8 weeks   PT Treatment/Interventions ADLs/Self Care Home Management;Gait training;Stair training;Functional mobility training;Therapeutic activities;Therapeutic exercise;Balance training;Patient/family education;Neuromuscular re-education;Energy conservation   PT Next Visit Plan Progress LE strengthening and balance exercise   PT Home Exercise Plan Sit to stands,  SLS, tandem stance   Consulted and Agree with Plan of Care Patient;Family member/caregiver   Family Member Consulted Husband      Patient will benefit from skilled therapeutic intervention in order to improve the following deficits and impairments:  Abnormal gait, Decreased cognition, Impaired sensation, Decreased coordination, Decreased mobility, Postural dysfunction, Decreased strength, Decreased activity tolerance, Decreased balance, Difficulty walking  Visit Diagnosis: Muscle weakness (generalized)  Difficulty in walking, not elsewhere classified  Unsteadiness on feet     Problem List Patient Active Problem List   Diagnosis Date Noted  . Back muscle spasm 09/09/2015  . Clinical depression 04/12/2015  . Herpes zona 04/12/2015  . Chronic ITP (idiopathic thrombocytopenic purpura) (HCC) 03/15/2015  . Chest pain  10/22/2014  . Breathlessness on exertion 10/22/2014  . Osteopenia 10/18/2014  . Benign essential HTN 06/28/2014  . HLD (hyperlipidemia) 06/28/2014  . Acquired hypothyroidism 06/04/2014  . Anxiety and depression 06/04/2014  . Bursitis, trochanteric 03/27/2014  . DDD (degenerative disc disease), lumbar 02/27/2014  . Neuritis or radiculitis due to rupture of lumbar intervertebral disc 02/27/2014    Collie Siad PT, DPT 02/01/2017, 9:37 AM  [place] MAIN [facility] SERVICES [address] [place], [place], [address] Phone: [phone]   Fax:  [phone]  Name: Ariana [name] MRN: [id number] Date of Birth: [birth year]

## 2017-02-02 DIAGNOSIS — H16223 Keratoconjunctivitis sicca, not specified as Sjogren's, bilateral: Secondary | ICD-10-CM | POA: Diagnosis not present

## 2017-02-02 NOTE — Progress Notes (Signed)
[place]  [phone]  [phone]     Ariana [name] DOB: Oct 21, 1932  MR#: [id number]  YKD#:[id number]  Patient Care Team: Idelle Crouch, MD as PCP - General (Internal Medicine)  CHIEF COMPLAINT: Chronic refractory ITP  INTERVAL HISTORY:  Patient returns to clinic today for repeat laboratory work and consideration of additional Nplate. She currently feels well and is asymptomatic.  She does not complain of easy bruising. She has no neurologic complaints. She denies any fevers. She has no chest pain or shortness of breath. She denies any nausea, vomiting, constipation, or diarrhea. She has no urinary complaints. Patient offers no specific complaints today.  REVIEW OF SYSTEMS:   Review of Systems  Constitutional: Negative for fever, malaise/fatigue and weight loss.  HENT: Negative for congestion.   Respiratory: Negative.  Negative for shortness of breath.   Cardiovascular: Negative.  Negative for chest pain and leg swelling.  Gastrointestinal: Negative for abdominal pain, blood in stool, diarrhea, melena and nausea.  Genitourinary: Negative for dysuria and hematuria.  Musculoskeletal: Negative.   Skin: Negative.  Negative for rash.  Neurological: Negative.  Negative for sensory change and weakness.  Endo/Heme/Allergies: Does not bruise/bleed easily.  Psychiatric/Behavioral: Negative.  The patient is not nervous/anxious.     As per HPI. Otherwise, a complete review of systems is negative.  PAST MEDICAL HISTORY: Past Medical History:  Diagnosis Date  . Anemia   . Anxiety   . Asthma   . Chronic back pain   . Depression   . Depression   . GERD (gastroesophageal reflux disease)   . Hiatal hernia   . Hypercholesteremia   . ITP (idiopathic thrombocytopenic purpura)   . Osteoarthritis   . Osteoporosis   . Pulmonary emboli ([place])   . Restless legs     PAST SURGICAL HISTORY: Past Surgical History:  Procedure Laterality Date    . CARPAL TUNNEL RELEASE    . ESOPHAGOGASTRODUODENOSCOPY (EGD) WITH PROPOFOL N/A 09/10/2015   Procedure: ESOPHAGOGASTRODUODENOSCOPY (EGD) WITH PROPOFOL;  Surgeon: Lollie Sails, MD;  Location: [facility] ENDOSCOPY;  Service: Endoscopy;  Laterality: N/A;  . LUMBAR [place]    . SPLENECTOMY, PARTIAL    . TOTAL ABDOMINAL HYSTERECTOMY      FAMILY HISTORY Family History  Problem Relation Age of Onset  . Stroke Mother     GYNECOLOGIC HISTORY:  No LMP recorded. Patient is postmenopausal.     ADVANCED DIRECTIVES:    HEALTH MAINTENANCE: Social History  Substance Use Topics  . Smoking status: Never Smoker  . Smokeless tobacco: Never Used  . Alcohol use No     Allergies  Allergen Reactions  . Aspirin     Other reaction(s): Distress (finding)  . Diazepam     Other reaction(s): Itching of Skin sneezing  . Morphine     Other reaction(s): Itching of Skin  . Other     Other reaction(s): Unknown seasonal allergies  . Tetanus Toxoids     Other reaction(s): Localized superficial swelling of skin    Current Outpatient Prescriptions  Medication Sig Dispense Refill  . acetaminophen (TYLENOL) 325 MG tablet Take 2 tablets (650 mg total) by mouth every 6 (six) hours as needed for mild pain (or Fever >/= 101). 100 tablet 0  . albuterol (PROVENTIL HFA;VENTOLIN HFA) 108 (90 BASE) MCG/ACT inhaler Inhale 2 puffs into the lungs every 6 (six) hours as needed for wheezing or shortness of breath.    . ALPRAZolam (XANAX) 0.25 MG tablet TAKE 1 TABLET TWICE  DAILY AS NEEDED FOR SLEEP    . BUDESONIDE-FORMOTEROL FUMARATE IN Inhale 2 puffs into the lungs 2 (two) times daily as needed (for shortness of breath and wheezing.).     Marland Kitchen buPROPion (WELLBUTRIN XL) 150 MG 24 hr tablet Take 1 tablet by mouth daily.    . Calcium Carbonate-Vitamin D (CALCIUM 600+D) 600-400 MG-UNIT per tablet Take 1 tablet by mouth daily.    . chlorpheniramine-HYDROcodone (TUSSIONEX PENNKINETIC ER) 10-8 MG/5ML SUER Take 5 mLs by  mouth 2 (two) times daily. 140 mL 0  . citalopram (CELEXA) 40 MG tablet Take 40 mg by mouth daily.    . Cyanocobalamin (RA VITAMIN B-12 TR) 1000 MCG TBCR Take 1,000 mcg by mouth daily.     Marland Kitchen donepezil (ARICEPT) 5 MG tablet Take 5 mg by mouth daily.    . Fluticasone-Salmeterol (ADVAIR) 250-50 MCG/DOSE AEPB Inhale 1 puff into the lungs 2 (two) times daily.    Marland Kitchen gabapentin (NEURONTIN) 300 MG capsule 300mg  orally 4 times a day    . HYDROcodone-acetaminophen (NORCO) 7.5-325 MG tablet 1 po tid prn Earliest Fill Date: 11/14/16    . levothyroxine (SYNTHROID, LEVOTHROID) 50 MCG tablet Take 50 mcg by mouth daily before breakfast. Take 30 to 60 minutes before breakfast.    . LORazepam (ATIVAN) 0.5 MG tablet Take 1 tablet (0.5 mg total) by mouth every 8 (eight) hours as needed for anxiety. 30 tablet 0  . Multiple Vitamins-Minerals (OCUVITE-LUTEIN PO) Take 1 capsule by mouth daily.     Marland Kitchen oxybutynin (DITROPAN) 5 MG tablet Take by mouth.    . pantoprazole (PROTONIX) 20 MG tablet Take 20 mg by mouth 2 (two) times daily.     . pravastatin (PRAVACHOL) 20 MG tablet Take 20 mg by mouth daily.    . traMADol (ULTRAM) 50 MG tablet Take 50 mg by mouth 3 (three) times daily as needed for moderate pain or severe pain. pain    . vitamin C (ASCORBIC ACID) 500 MG tablet Take 500 mg by mouth daily.    . vitamin E (E-400) 400 UNIT capsule Take 400 Units by mouth daily.    Marland Kitchen zolpidem (AMBIEN) 5 MG tablet Take 5 mg by mouth at bedtime as needed for sleep.    . memantine (NAMENDA) 5 MG tablet Take by mouth.     No current facility-administered medications for this visit.     OBJECTIVE: BP 129/67 (BP Location: Right Arm, Patient Position: Sitting)   Pulse (!) 57   Temp 97.4 F (36.3 C) (Tympanic)   Wt 147 lb 9.6 oz (67 kg)   BMI 24.56 kg/m    Body mass index is 24.56 kg/m.    ECOG FS:1 - Symptomatic but completely ambulatory  General: Well-developed, well-nourished, no acute distress. Eyes: Pink conjunctiva, anicteric  sclera. HEENT: Normocephalic, moist mucous membranes, clear oropharnyx. Lungs: Clear to auscultation bilaterally. Heart: Regular rate and rhythm. No rubs, murmurs, or gallops. Musculoskeletal: No edema, cyanosis, or clubbing. Neuro: Alert, answering all questions appropriately. Cranial nerves grossly intact. Skin: No petechiae noted. Bruising noted in various stages of healing. Psych: Normal affect.    LAB RESULTS:  Appointment on 02/03/2017  Component Date Value Ref Range Status  . WBC 02/03/2017 13.4* 3.6 - 11.0 K/uL Final  . RBC 02/03/2017 3.97  3.80 - 5.20 MIL/uL Final  . Hemoglobin 02/03/2017 11.9* 12.0 - 16.0 g/dL Final  . HCT 02/03/2017 35.7  35.0 - 47.0 % Final  . MCV 02/03/2017 90.0  80.0 - 100.0 fL Final  .  MCH 02/03/2017 29.9  26.0 - 34.0 pg Final  . MCHC 02/03/2017 33.2  32.0 - 36.0 g/dL Final  . RDW 02/03/2017 16.1* 11.5 - 14.5 % Final  . Platelets 02/03/2017 311  150 - 440 K/uL Final  . Neutrophils Relative % 02/03/2017 65  % Final  . Neutro Abs 02/03/2017 8.6* 1.4 - 6.5 K/uL Final  . Lymphocytes Relative 02/03/2017 24  % Final  . Lymphs Abs 02/03/2017 3.3  1.0 - 3.6 K/uL Final  . Monocytes Relative 02/03/2017 8  % Final  . Monocytes Absolute 02/03/2017 1.0* 0.2 - 0.9 K/uL Final  . Eosinophils Relative 02/03/2017 2  % Final  . Eosinophils Absolute 02/03/2017 0.3  0 - 0.7 K/uL Final  . Basophils Relative 02/03/2017 1  % Final  . Basophils Absolute 02/03/2017 0.2* 0 - 0.1 K/uL Final    STUDIES: No results found.  ASSESSMENT:  Chronic refractory ITP  PLAN:   1. Chronic refractory ITP: Patient had a poor response to Prednisone, WinRho, and Rituxan. She is status post splenectomy in 2007. Her platelet count has significantly improved after her most recent injection of Nplate on January 20, 4141. She does not require 4 mcg/kg of Nplate today. Return to clinic in 2 weeks for laboratory work and consideration of Nplate and then in 1 month for laboratory work, further  evaluation, and consideration of Nplate if her platelet count falls below 100. Patient appears to only requiring injections every 3-4 weeks. Can consider Promacta 12.5mg  at a later date if necessary. Max dose of Nplate is 10 mcg/kg. 2. Nausea: Resolved.  3. Diarrhea: Resolved.  Patient expressed understanding and was in agreement with this plan. She also understands that She can call clinic at any time with any questions, concerns, or complaints.    Lloyd Huger, MD   02/06/2017 7:09 AM

## 2017-02-03 ENCOUNTER — Inpatient Hospital Stay: Payer: Medicare HMO

## 2017-02-03 ENCOUNTER — Inpatient Hospital Stay (HOSPITAL_BASED_OUTPATIENT_CLINIC_OR_DEPARTMENT_OTHER): Payer: Medicare HMO | Admitting: Oncology

## 2017-02-03 ENCOUNTER — Ambulatory Visit: Payer: Medicare HMO

## 2017-02-03 ENCOUNTER — Telehealth: Payer: Self-pay

## 2017-02-03 VITALS — BP 129/67 | HR 57 | Temp 97.4°F | Wt 147.6 lb

## 2017-02-03 VITALS — BP 150/69 | HR 54

## 2017-02-03 DIAGNOSIS — Z9081 Acquired absence of spleen: Secondary | ICD-10-CM

## 2017-02-03 DIAGNOSIS — Z86711 Personal history of pulmonary embolism: Secondary | ICD-10-CM | POA: Diagnosis not present

## 2017-02-03 DIAGNOSIS — M6281 Muscle weakness (generalized): Secondary | ICD-10-CM

## 2017-02-03 DIAGNOSIS — D693 Immune thrombocytopenic purpura: Secondary | ICD-10-CM | POA: Diagnosis not present

## 2017-02-03 DIAGNOSIS — M1990 Unspecified osteoarthritis, unspecified site: Secondary | ICD-10-CM | POA: Diagnosis not present

## 2017-02-03 DIAGNOSIS — K219 Gastro-esophageal reflux disease without esophagitis: Secondary | ICD-10-CM | POA: Diagnosis not present

## 2017-02-03 DIAGNOSIS — Z79899 Other long term (current) drug therapy: Secondary | ICD-10-CM | POA: Diagnosis not present

## 2017-02-03 DIAGNOSIS — G8929 Other chronic pain: Secondary | ICD-10-CM | POA: Diagnosis not present

## 2017-02-03 DIAGNOSIS — Z780 Asymptomatic menopausal state: Secondary | ICD-10-CM

## 2017-02-03 DIAGNOSIS — R2681 Unsteadiness on feet: Secondary | ICD-10-CM | POA: Diagnosis not present

## 2017-02-03 DIAGNOSIS — G2581 Restless legs syndrome: Secondary | ICD-10-CM | POA: Diagnosis not present

## 2017-02-03 DIAGNOSIS — M549 Dorsalgia, unspecified: Secondary | ICD-10-CM | POA: Diagnosis not present

## 2017-02-03 DIAGNOSIS — F418 Other specified anxiety disorders: Secondary | ICD-10-CM

## 2017-02-03 DIAGNOSIS — R262 Difficulty in walking, not elsewhere classified: Secondary | ICD-10-CM | POA: Diagnosis not present

## 2017-02-03 DIAGNOSIS — E7800 Pure hypercholesterolemia, unspecified: Secondary | ICD-10-CM

## 2017-02-03 LAB — CBC WITH DIFFERENTIAL/PLATELET
Basophils Absolute: 0.2 10*3/uL — ABNORMAL HIGH (ref 0–0.1)
Basophils Relative: 1 %
EOS ABS: 0.3 10*3/uL (ref 0–0.7)
Eosinophils Relative: 2 %
HEMATOCRIT: 35.7 % (ref 35.0–47.0)
HEMOGLOBIN: 11.9 g/dL — AB (ref 12.0–16.0)
LYMPHS PCT: 24 %
Lymphs Abs: 3.3 10*3/uL (ref 1.0–3.6)
MCH: 29.9 pg (ref 26.0–34.0)
MCHC: 33.2 g/dL (ref 32.0–36.0)
MCV: 90 fL (ref 80.0–100.0)
MONOS PCT: 8 %
Monocytes Absolute: 1 10*3/uL — ABNORMAL HIGH (ref 0.2–0.9)
NEUTROS PCT: 65 %
Neutro Abs: 8.6 10*3/uL — ABNORMAL HIGH (ref 1.4–6.5)
Platelets: 311 10*3/uL (ref 150–440)
RBC: 3.97 MIL/uL (ref 3.80–5.20)
RDW: 16.1 % — ABNORMAL HIGH (ref 11.5–14.5)
WBC: 13.4 10*3/uL — ABNORMAL HIGH (ref 3.6–11.0)

## 2017-02-03 NOTE — Progress Notes (Signed)
Patient denies pain or discomfort at this time, vitals stable and document e

## 2017-02-03 NOTE — Telephone Encounter (Signed)
-----   Message from [facility] sent at 02/03/2017 10:14 AM EDT ----- Regarding: Nplate Good morning! I received a vm from a nurse case manager and MD with [employer] last night at 6:00pm. They are located in [place] so they are 4 hours behind [place]. They stated that the last Platelet count was too high and needed to be less than 100 before they are able to authorize Nplate. I have called and left a vm for the nurse case manager but again they are 4 hours behind [place]. It may be 11:30 or so before I hear from them. Dr. Grayland Ormond may need to complete peer-to-peer.  Please let me know how he wants to proceed.  Thank you,  Velna Hatchet

## 2017-02-03 NOTE — Therapy (Signed)
[place] MAIN [facility] SERVICES [address] [place], [place], [address] Phone: [phone]   Fax:  [phone]  Physical Therapy Treatment  Patient Details  Name: Ariana [name] MRN: [id number] Date of Birth: Sep 09, 1933 Referring Provider: Jennings Books MD  Encounter Date: 02/03/2017      PT End of Session - 02/03/17 1419    Visit Number 6   Number of Visits 16   Date for PT Re-Evaluation 03/11/17   Authorization Type 6/10 G code   PT Start Time 0940   PT Stop Time 1014   PT Time Calculation (min) 34 min   Equipment Utilized During Treatment Gait belt   Activity Tolerance Patient tolerated treatment well;No increased pain   Behavior During Therapy WFL for tasks assessed/performed      Past Medical History:  Diagnosis Date  . Anemia   . Anxiety   . Asthma   . Chronic back pain   . Depression   . Depression   . GERD (gastroesophageal reflux disease)   . Hiatal hernia   . Hypercholesteremia   . ITP (idiopathic thrombocytopenic purpura)   . Osteoarthritis   . Osteoporosis   . Pulmonary emboli ([place])   . Restless legs     Past Surgical History:  Procedure Laterality Date  . CARPAL TUNNEL RELEASE    . ESOPHAGOGASTRODUODENOSCOPY (EGD) WITH PROPOFOL N/A 09/10/2015   Procedure: ESOPHAGOGASTRODUODENOSCOPY (EGD) WITH PROPOFOL;  Surgeon: Lollie Sails, MD;  Location: [facility] ENDOSCOPY;  Service: Endoscopy;  Laterality: N/A;  . LUMBAR [place]    . SPLENECTOMY, PARTIAL    . TOTAL ABDOMINAL HYSTERECTOMY      Vitals:   02/03/17 0941  BP: (!) 150/69  Pulse: (!) 54  SpO2: 99%        Subjective Assessment - 02/03/17 0940    Subjective Pt reports she is doing well at this time. She denies pain currently. No specific questions or concerns currently. She is not performing HEP due to multiple doctor's appointments and fatigue.    Patient is accompained by: Family member   Pertinent History Patient presents with history  of Alzheimer's Dz, back surgery, rotator cuff surgery   Limitations Standing;Walking;Lifting   How long can you stand comfortably? 69min   How long can you walk comfortably? 100min   Patient Stated Goals To get back to walking and better getting up and down out of a chair   Currently in Pain? No/denies          TREATMENT:   Neuromuscular Re-ed:  Tandem ambulation x 4 lengths in // bars, pt struggles considerably with this; Tandem static balance 30s x 3 in // bars, pt with 1-2 falls every 30 seconds; Feet together and then modified tandem with SAEBO alternating UE to challenge dynamic reaching and shifting COM in BOS x multiple bouts; Airex balance with stepping stone taps in 1 and 2 stone sequencing with therapist calling out laterality and color;  Therapeutic Exercise:  NuStep L3 x 5 minutes for warm-up (unbilled); BLE leg press 90# x 20, 105# x20 with cues and assist for eccentric control; Sit to stand from regular height chair with Airex under feet 2 x 5;                       PT Education - 02/03/17 1418    Education provided Yes   Education Details HEP reinforced, discussed modification to current exercises and importance of compliance   Person(s) Educated  Patient   Methods Explanation   Comprehension Verbalized understanding             PT Long Term Goals - 01/14/17 1013      PT LONG TERM GOAL #1   Title Pt will score 24/24 on the DGI to demonstrate significant improvement in ambulation ability and decreased fall risk.   Baseline DGI: 19/24   Time 8   Period Weeks   Status New     PT LONG TERM GOAL #2   Title Patient will decreased cognitive and normal TUG below 10sec to demonstrate significant improvement in fall risk   Baseline normal TUG: 12sec; cognitive TUG: 16sec   Time 8   Period Weeks   Status New     PT LONG TERM GOAL #3   Title Patient will improve 5xSTS to below 12 to demonstrate significant improvement in functional LE  strength and ability to stand out of a chair to amb to the restroom.    Baseline 5XSTS: 22sec   Time 8   Period Weeks   Status New     PT LONG TERM GOAL #4   Title Patient will be independent with HEP to continue with functional gains after discharge from PT.    Baseline dependent on form/technique   Time 8   Period Weeks   Status New               Plan - 02/03/17 1419    Clinical Impression Statement Pt struggles with full tandem static gait and tandem walking so regressed to modified tandem activities today. She also struggles with sit to stand with Airex pad under her feet due to both LE weakness and poor balance. Pt reports inability to perform full tandem balance at home so encouraged her to regress to modified tandem. Did not provide additional HEP as pt is currently not compliant with the exercises she has already been assigned. Encouraged pt to follow-up as scheduled.    Rehab Potential Fair   Clinical Impairments Affecting Rehab Potential (+) highly motivated, family support (-) Chroncity of issue, Alzheimer's dz   PT Frequency 2x / week   PT Duration 8 weeks   PT Treatment/Interventions ADLs/Self Care Home Management;Gait training;Stair training;Functional mobility training;Therapeutic activities;Therapeutic exercise;Balance training;Patient/family education;Neuromuscular re-education;Energy conservation   PT Next Visit Plan Progress LE strengthening and balance exercise   PT Home Exercise Plan Sit to stands, SLS, modified tandem stance   Consulted and Agree with Plan of Care Patient;Family member/caregiver   Family Member Consulted Husband      Patient will benefit from skilled therapeutic intervention in order to improve the following deficits and impairments:  Abnormal gait, Decreased cognition, Impaired sensation, Decreased coordination, Decreased mobility, Postural dysfunction, Decreased strength, Decreased activity tolerance, Decreased balance, Difficulty  walking  Visit Diagnosis: Difficulty in walking, not elsewhere classified  Unsteadiness on feet  Muscle weakness (generalized)     Problem List Patient Active Problem List   Diagnosis Date Noted  . Asthma without status asthmaticus 11/25/2016  . Late onset Alzheimer's disease without behavioral disturbance 12/30/2015  . Back muscle spasm 09/09/2015  . Clinical depression 04/12/2015  . Herpes zona 04/12/2015  . Chronic ITP (idiopathic thrombocytopenic purpura) (HCC) 03/15/2015  . Chest pain 10/22/2014  . Breathlessness on exertion 10/22/2014  . Osteopenia 10/18/2014  . Benign essential HTN 06/28/2014  . HLD (hyperlipidemia) 06/28/2014  . Idiopathic thrombocythemia ([place]) 06/28/2014  . Acquired hypothyroidism 06/04/2014  . Anxiety and depression 06/04/2014  . Bursitis, trochanteric 03/27/2014  .  DDD (degenerative disc disease), lumbar 02/27/2014  . Neuritis or radiculitis due to rupture of lumbar intervertebral disc 02/27/2014   Phillips Grout PT, DPT   Ariana [name] 02/03/2017, 2:25 PM  [place] MAIN [facility] SERVICES [address] [place], [place], [address] Phone: [phone]   Fax:  [phone]  Name: Ariana [name] MRN: [id number] Date of Birth: 1933/01/14

## 2017-02-08 ENCOUNTER — Ambulatory Visit: Payer: Medicare HMO

## 2017-02-08 VITALS — BP 136/48 | HR 58

## 2017-02-08 DIAGNOSIS — R2681 Unsteadiness on feet: Secondary | ICD-10-CM | POA: Diagnosis not present

## 2017-02-08 DIAGNOSIS — R262 Difficulty in walking, not elsewhere classified: Secondary | ICD-10-CM | POA: Diagnosis not present

## 2017-02-08 DIAGNOSIS — M6281 Muscle weakness (generalized): Secondary | ICD-10-CM

## 2017-02-08 NOTE — Therapy (Signed)
[place] MAIN [facility] SERVICES [address] [place], [place], [address] Phone: [phone]   Fax:  [phone]  Physical Therapy Treatment  Patient Details  Name: Ariana [name] MRN: [id number] Date of Birth: 1933/01/13 Referring Provider: Jennings Books MD  Encounter Date: 02/08/2017      PT End of Session - 02/08/17 0924    Visit Number 7   Number of Visits 16   Date for PT Re-Evaluation 03/11/17   Authorization Type 7/10 G code   PT Start Time 0927   PT Stop Time 1010   PT Time Calculation (min) 43 min   Equipment Utilized During Treatment Gait belt   Activity Tolerance Patient tolerated treatment well;No increased pain   Behavior During Therapy WFL for tasks assessed/performed      Past Medical History:  Diagnosis Date  . Anemia   . Anxiety   . Asthma   . Chronic back pain   . Depression   . Depression   . GERD (gastroesophageal reflux disease)   . Hiatal hernia   . Hypercholesteremia   . ITP (idiopathic thrombocytopenic purpura)   . Osteoarthritis   . Osteoporosis   . Pulmonary emboli ([place])   . Restless legs     Past Surgical History:  Procedure Laterality Date  . CARPAL TUNNEL RELEASE    . ESOPHAGOGASTRODUODENOSCOPY (EGD) WITH PROPOFOL N/A 09/10/2015   Procedure: ESOPHAGOGASTRODUODENOSCOPY (EGD) WITH PROPOFOL;  Surgeon: Lollie Sails, MD;  Location: [facility] ENDOSCOPY;  Service: Endoscopy;  Laterality: N/A;  . LUMBAR [place]    . SPLENECTOMY, PARTIAL    . TOTAL ABDOMINAL HYSTERECTOMY      Vitals:   02/08/17 1013  BP: (!) 136/48  Pulse: (!) 58  SpO2: 98%        Subjective Assessment - 02/08/17 0924    Subjective Pt reports that she is doing well on this date. No falls since her last therapy session. Denies pain. No specific questions or concerns currently.    Patient is accompained by: Family member   Pertinent History Patient presents with history of Alzheimer's Dz, back surgery, rotator  cuff surgery   Limitations Standing;Walking;Lifting   How long can you stand comfortably? 22min   How long can you walk comfortably? 7min   Patient Stated Goals To get back to walking and better getting up and down out of a chair   Currently in Pain? No/denies             TREATMENT:   Neuromuscular Re-ed:  Modified tandem static balance 30s, alternating forward LE x multiple bouts; Feet together on Airex static balance with eyes open, LOB every 10-12 seconds; Pt continually wants to close her eyes and requires continual cues to keep them open; Airex balance with toe taps to 6" step alternating LE x 10 each; Sidestepping on Airex Balance beam x multiple lengths each direction; Rockerboard balance R/L direction, static; Rockerboard balance R/L direction with dynamic reaching to targets with therapist calling out target and direction; Rockerboard balance A/P direction, static; Rockerboard balance A/P direction with dynamic reaching to targets with therapist calling out target and direction;  Therapeutic Exercise:  NuStep L4 x 5 minutes for warm-up (unbilled); BLE leg press 105# x 20, 120# 2 x 20,  with cues and assist for eccentric control, pt struggles to lower in a controlled fashion; RTB standing hip abduction and extension in // bars 2 x 10 each direction bilateral; Mini squats in // bars 2 x 10;  PT Education - 02/08/17 0924    Education provided Yes   Education Details Redirection during session. Form/technique for exercise correction   Person(s) Educated Patient   Methods Explanation   Comprehension Verbalized understanding             PT Long Term Goals - 01/14/17 1013      PT LONG TERM GOAL #1   Title Pt will score 24/24 on the DGI to demonstrate significant improvement in ambulation ability and decreased fall risk.   Baseline DGI: 19/24   Time 8   Period Weeks   Status New     PT LONG TERM GOAL #2   Title Patient  will decreased cognitive and normal TUG below 10sec to demonstrate significant improvement in fall risk   Baseline normal TUG: 12sec; cognitive TUG: 16sec   Time 8   Period Weeks   Status New     PT LONG TERM GOAL #3   Title Patient will improve 5xSTS to below 12 to demonstrate significant improvement in functional LE strength and ability to stand out of a chair to amb to the restroom.    Baseline 5XSTS: 22sec   Time 8   Period Weeks   Status New     PT LONG TERM GOAL #4   Title Patient will be independent with HEP to continue with functional gains after discharge from PT.    Baseline dependent on form/technique   Time 8   Period Weeks   Status New               Plan - 02/08/17 1191    Clinical Impression Statement Pt continues to struggle with narrow stance balance. She requires frequent redirection during session due to poor command follow and being easily distracted. Poor balance on Airex pad and rockerboard. Encouraged pt to continue HEP and follow-up as scheduled.    Rehab Potential Fair   Clinical Impairments Affecting Rehab Potential (+) highly motivated, family support (-) Chroncity of issue, Alzheimer's dz   PT Frequency 2x / week   PT Duration 8 weeks   PT Treatment/Interventions ADLs/Self Care Home Management;Gait training;Stair training;Functional mobility training;Therapeutic activities;Therapeutic exercise;Balance training;Patient/family education;Neuromuscular re-education;Energy conservation   PT Next Visit Plan Progress LE strengthening and balance exercise   PT Home Exercise Plan Sit to stands, SLS, modified tandem stance   Consulted and Agree with Plan of Care Patient;Family member/caregiver   Family Member Consulted Husband      Patient will benefit from skilled therapeutic intervention in order to improve the following deficits and impairments:  Abnormal gait, Decreased cognition, Impaired sensation, Decreased coordination, Decreased mobility, Postural  dysfunction, Decreased strength, Decreased activity tolerance, Decreased balance, Difficulty walking  Visit Diagnosis: Difficulty in walking, not elsewhere classified  Unsteadiness on feet  Muscle weakness (generalized)     Problem List Patient Active Problem List   Diagnosis Date Noted  . Asthma without status asthmaticus 11/25/2016  . Late onset Alzheimer's disease without behavioral disturbance 12/30/2015  . Back muscle spasm 09/09/2015  . Clinical depression 04/12/2015  . Herpes zona 04/12/2015  . Chronic ITP (idiopathic thrombocytopenic purpura) (HCC) 03/15/2015  . Chest pain 10/22/2014  . Breathlessness on exertion 10/22/2014  . Osteopenia 10/18/2014  . Benign essential HTN 06/28/2014  . HLD (hyperlipidemia) 06/28/2014  . Idiopathic thrombocythemia ([place]) 06/28/2014  . Acquired hypothyroidism 06/04/2014  . Anxiety and depression 06/04/2014  . Bursitis, trochanteric 03/27/2014  . DDD (degenerative disc disease), lumbar 02/27/2014  . Neuritis or radiculitis due to rupture of  lumbar intervertebral disc 02/27/2014   Phillips Grout PT, DPT   Verma Grothaus 02/08/2017, 10:26 AM  [place] MAIN [facility] SERVICES [address] [place], [place], [address] Phone: [phone]   Fax:  [phone]  Name: Ariana [name] MRN: [id number] Date of Birth: 02-Nov-1932

## 2017-02-10 ENCOUNTER — Ambulatory Visit: Payer: Medicare HMO

## 2017-02-15 ENCOUNTER — Ambulatory Visit: Payer: Medicare HMO

## 2017-02-17 ENCOUNTER — Ambulatory Visit: Payer: Medicare HMO

## 2017-02-17 ENCOUNTER — Inpatient Hospital Stay: Payer: Medicare HMO | Attending: Oncology

## 2017-02-17 ENCOUNTER — Telehealth: Payer: Self-pay

## 2017-02-17 ENCOUNTER — Telehealth: Payer: Self-pay | Admitting: *Deleted

## 2017-02-17 ENCOUNTER — Inpatient Hospital Stay: Payer: Medicare HMO

## 2017-02-17 DIAGNOSIS — D693 Immune thrombocytopenic purpura: Secondary | ICD-10-CM | POA: Insufficient documentation

## 2017-02-17 DIAGNOSIS — F418 Other specified anxiety disorders: Secondary | ICD-10-CM | POA: Insufficient documentation

## 2017-02-17 DIAGNOSIS — G8929 Other chronic pain: Secondary | ICD-10-CM | POA: Insufficient documentation

## 2017-02-17 DIAGNOSIS — E7800 Pure hypercholesterolemia, unspecified: Secondary | ICD-10-CM | POA: Insufficient documentation

## 2017-02-17 DIAGNOSIS — Z86711 Personal history of pulmonary embolism: Secondary | ICD-10-CM | POA: Insufficient documentation

## 2017-02-17 DIAGNOSIS — Z780 Asymptomatic menopausal state: Secondary | ICD-10-CM | POA: Insufficient documentation

## 2017-02-17 DIAGNOSIS — Z79899 Other long term (current) drug therapy: Secondary | ICD-10-CM | POA: Diagnosis not present

## 2017-02-17 DIAGNOSIS — K219 Gastro-esophageal reflux disease without esophagitis: Secondary | ICD-10-CM | POA: Insufficient documentation

## 2017-02-17 DIAGNOSIS — M549 Dorsalgia, unspecified: Secondary | ICD-10-CM | POA: Diagnosis not present

## 2017-02-17 LAB — CBC WITH DIFFERENTIAL/PLATELET
Basophils Absolute: 0.1 10*3/uL (ref 0–0.1)
Basophils Relative: 1 %
EOS ABS: 0.2 10*3/uL (ref 0–0.7)
Eosinophils Relative: 2 %
HEMATOCRIT: 39.6 % (ref 35.0–47.0)
HEMOGLOBIN: 13.2 g/dL (ref 12.0–16.0)
LYMPHS ABS: 1.5 10*3/uL (ref 1.0–3.6)
LYMPHS PCT: 12 %
MCH: 29.9 pg (ref 26.0–34.0)
MCHC: 33.4 g/dL (ref 32.0–36.0)
MCV: 89.5 fL (ref 80.0–100.0)
MONOS PCT: 4 %
Monocytes Absolute: 0.5 10*3/uL (ref 0.2–0.9)
NEUTROS ABS: 10.2 10*3/uL — AB (ref 1.4–6.5)
NEUTROS PCT: 81 %
Platelets: 10 10*3/uL — CL (ref 150–440)
RBC: 4.43 MIL/uL (ref 3.80–5.20)
RDW: 15.8 % — ABNORMAL HIGH (ref 11.5–14.5)
WBC: 12.6 10*3/uL — AB (ref 3.6–11.0)

## 2017-02-17 MED ORDER — ROMIPLOSTIM 250 MCG ~~LOC~~ SOLR
4.0000 ug/kg | Freq: Once | SUBCUTANEOUS | Status: DC
Start: 2017-02-17 — End: 2017-02-17

## 2017-02-17 NOTE — Progress Notes (Signed)
Plts 10. Patient received Nplate dose of 098JXB (48mcg/kg) at last injection. Called to clarify dose today due to low plts. MD want to continue last dose of 459mcg (29mcg/kg) at this time.

## 2017-02-17 NOTE — Progress Notes (Unsigned)
Went downstairs to update the patient that we were still waiting for p/a for Nplate from insurance company.  But patient was not in lab waiting area and she had left the office.  I called the patient's mobile number and left a message for her to call the office for an update of when she plans to receive Nplate.

## 2017-02-17 NOTE — Telephone Encounter (Signed)
Patient returned to the office this afternoon but the Nplate still in appeals process.  (see documentation note)

## 2017-02-17 NOTE — Telephone Encounter (Signed)
Patient left the office before p/a for Nplate was obtained.  I called the patient's mobile number and left a message for her to call the office for an update of when she plans to return for Nplate injection.

## 2017-02-17 NOTE — Telephone Encounter (Signed)
Notified Dr Grayland Ormond by phone of Platelet count of 10 per call from [facility] in lab. Dr Wanda Plump states we are awaiting approval of NPlate for her

## 2017-02-17 NOTE — Progress Notes (Signed)
Patient is here for Nplate injection today.  The platelets result today are 10 but patient was not able to receive the injection today due to insurance p/a being in an appeals process.    She is feelilng very weak and tired today.  Discussed with patient the potential of easy bleeding with a critical low platelet count of 10 day.  Patient states she is aware of symptoms associated with critical low platelets with her chronic refractory ITP diagnose.  Also mentions that when platelets dropped to 4 in 2016 she had a syncopal episode where she was hospitalized and received a platelet transfusion.  Informed patient that I would be in contact with her to see how she is feeling and keep her updated on insurance process.  Also advised patient that if she starts bleeding or have syncopal episode to go to ER, her and husband agreed.

## 2017-02-18 ENCOUNTER — Emergency Department: Payer: Medicare HMO

## 2017-02-18 ENCOUNTER — Inpatient Hospital Stay
Admission: EM | Admit: 2017-02-18 | Discharge: 2017-02-20 | DRG: 813 | Disposition: A | Discharge status: Home / Self Care | Payer: Medicare HMO | Attending: Internal Medicine | Admitting: Internal Medicine

## 2017-02-18 ENCOUNTER — Encounter: Payer: Self-pay | Admitting: *Deleted

## 2017-02-18 DIAGNOSIS — F418 Other specified anxiety disorders: Secondary | ICD-10-CM | POA: Diagnosis present

## 2017-02-18 DIAGNOSIS — D696 Thrombocytopenia, unspecified: Secondary | ICD-10-CM | POA: Diagnosis not present

## 2017-02-18 DIAGNOSIS — E785 Hyperlipidemia, unspecified: Secondary | ICD-10-CM | POA: Diagnosis not present

## 2017-02-18 DIAGNOSIS — Z9081 Acquired absence of spleen: Secondary | ICD-10-CM | POA: Diagnosis not present

## 2017-02-18 DIAGNOSIS — G629 Polyneuropathy, unspecified: Secondary | ICD-10-CM | POA: Diagnosis present

## 2017-02-18 DIAGNOSIS — Z86711 Personal history of pulmonary embolism: Secondary | ICD-10-CM

## 2017-02-18 DIAGNOSIS — K5900 Constipation, unspecified: Secondary | ICD-10-CM | POA: Diagnosis not present

## 2017-02-18 DIAGNOSIS — M1990 Unspecified osteoarthritis, unspecified site: Secondary | ICD-10-CM | POA: Diagnosis not present

## 2017-02-18 DIAGNOSIS — R1010 Upper abdominal pain, unspecified: Secondary | ICD-10-CM | POA: Diagnosis not present

## 2017-02-18 DIAGNOSIS — J45909 Unspecified asthma, uncomplicated: Secondary | ICD-10-CM | POA: Diagnosis present

## 2017-02-18 DIAGNOSIS — Z79899 Other long term (current) drug therapy: Secondary | ICD-10-CM | POA: Diagnosis not present

## 2017-02-18 DIAGNOSIS — R5383 Other fatigue: Secondary | ICD-10-CM | POA: Diagnosis not present

## 2017-02-18 DIAGNOSIS — E7800 Pure hypercholesterolemia, unspecified: Secondary | ICD-10-CM | POA: Diagnosis not present

## 2017-02-18 DIAGNOSIS — F0390 Unspecified dementia without behavioral disturbance: Secondary | ICD-10-CM | POA: Diagnosis present

## 2017-02-18 DIAGNOSIS — D690 Allergic purpura: Secondary | ICD-10-CM | POA: Diagnosis not present

## 2017-02-18 DIAGNOSIS — K219 Gastro-esophageal reflux disease without esophagitis: Secondary | ICD-10-CM | POA: Diagnosis present

## 2017-02-18 DIAGNOSIS — D72829 Elevated white blood cell count, unspecified: Secondary | ICD-10-CM | POA: Diagnosis not present

## 2017-02-18 DIAGNOSIS — M810 Age-related osteoporosis without current pathological fracture: Secondary | ICD-10-CM | POA: Diagnosis present

## 2017-02-18 DIAGNOSIS — G2581 Restless legs syndrome: Secondary | ICD-10-CM | POA: Diagnosis present

## 2017-02-18 DIAGNOSIS — Z90710 Acquired absence of both cervix and uterus: Secondary | ICD-10-CM

## 2017-02-18 DIAGNOSIS — Z823 Family history of stroke: Secondary | ICD-10-CM | POA: Diagnosis not present

## 2017-02-18 DIAGNOSIS — R109 Unspecified abdominal pain: Secondary | ICD-10-CM | POA: Diagnosis not present

## 2017-02-18 DIAGNOSIS — R1030 Lower abdominal pain, unspecified: Secondary | ICD-10-CM

## 2017-02-18 DIAGNOSIS — R531 Weakness: Secondary | ICD-10-CM | POA: Diagnosis not present

## 2017-02-18 DIAGNOSIS — D693 Immune thrombocytopenic purpura: Principal | ICD-10-CM | POA: Diagnosis present

## 2017-02-18 DIAGNOSIS — R233 Spontaneous ecchymoses: Secondary | ICD-10-CM | POA: Diagnosis not present

## 2017-02-18 DIAGNOSIS — R630 Anorexia: Secondary | ICD-10-CM | POA: Diagnosis not present

## 2017-02-18 DIAGNOSIS — R5381 Other malaise: Secondary | ICD-10-CM | POA: Diagnosis not present

## 2017-02-18 DIAGNOSIS — E039 Hypothyroidism, unspecified: Secondary | ICD-10-CM | POA: Diagnosis not present

## 2017-02-18 LAB — URINALYSIS, COMPLETE (UACMP) WITH MICROSCOPIC
Bacteria, UA: NONE SEEN
Bilirubin Urine: NEGATIVE
GLUCOSE, UA: NEGATIVE mg/dL
Hgb urine dipstick: NEGATIVE
Ketones, ur: 5 mg/dL — AB
Leukocytes, UA: NEGATIVE
Nitrite: NEGATIVE
Protein, ur: NEGATIVE mg/dL
SPECIFIC GRAVITY, URINE: 1.045 — AB (ref 1.005–1.030)
pH: 7 (ref 5.0–8.0)

## 2017-02-18 LAB — CBC WITH DIFFERENTIAL/PLATELET
Basophils Absolute: 0.1 10*3/uL (ref 0–0.1)
Basophils Relative: 0 %
EOS ABS: 0.4 10*3/uL (ref 0–0.7)
Eosinophils Relative: 2 %
HCT: 40.5 % (ref 35.0–47.0)
HEMOGLOBIN: 13.1 g/dL (ref 12.0–16.0)
LYMPHS ABS: 3 10*3/uL (ref 1.0–3.6)
Lymphocytes Relative: 13 %
MCH: 29.2 pg (ref 26.0–34.0)
MCHC: 32.3 g/dL (ref 32.0–36.0)
MCV: 90.4 fL (ref 80.0–100.0)
MONO ABS: 1.1 10*3/uL — AB (ref 0.2–0.9)
Monocytes Relative: 5 %
Neutro Abs: 19.5 10*3/uL — ABNORMAL HIGH (ref 1.4–6.5)
Neutrophils Relative %: 80 %
PLATELETS: DECREASED 10*3/uL (ref 150–440)
RBC: 4.48 MIL/uL (ref 3.80–5.20)
RDW: 16.1 % — ABNORMAL HIGH (ref 11.5–14.5)
WBC: 24.1 10*3/uL — ABNORMAL HIGH (ref 3.6–11.0)

## 2017-02-18 LAB — COMPREHENSIVE METABOLIC PANEL
ALT: 14 U/L (ref 14–54)
AST: 25 U/L (ref 15–41)
Albumin: 3.7 g/dL (ref 3.5–5.0)
Alkaline Phosphatase: 60 U/L (ref 38–126)
Anion gap: 11 (ref 5–15)
BUN: 20 mg/dL (ref 6–20)
CALCIUM: 8.3 mg/dL — AB (ref 8.9–10.3)
CO2: 21 mmol/L — AB (ref 22–32)
Chloride: 105 mmol/L (ref 101–111)
Creatinine, Ser: 0.78 mg/dL (ref 0.44–1.00)
GFR calc non Af Amer: >60 mL/min (ref >60)
Glucose, Bld: 91 mg/dL (ref 65–99)
Potassium: 3.2 mmol/L — ABNORMAL LOW (ref 3.5–5.1)
SODIUM: 137 mmol/L (ref 135–145)
Total Bilirubin: 0.8 mg/dL (ref 0.3–1.2)
Total Protein: 6.2 g/dL — ABNORMAL LOW (ref 6.5–8.1)

## 2017-02-18 LAB — TSH: TSH: 3.317 u[IU]/mL (ref 0.350–4.500)

## 2017-02-18 LAB — LIPASE, BLOOD: Lipase: 16 U/L (ref 11–51)

## 2017-02-18 LAB — LACTIC ACID, PLASMA: Lactic Acid, Venous: 0.9 mmol/L (ref 0.5–1.9)

## 2017-02-18 LAB — TROPONIN I

## 2017-02-18 MED ORDER — CITALOPRAM HYDROBROMIDE 20 MG PO TABS
40.0000 mg | ORAL_TABLET | Freq: Every day | ORAL | Status: DC
  Administered 2017-02-18 – 2017-02-20 (×3): 40 mg via ORAL
  Filled 2017-02-18 (×3): qty 2

## 2017-02-18 MED ORDER — SODIUM CHLORIDE 0.9 % IV SOLN: 250.0000 mL | INTRAVENOUS | Status: DC | PRN

## 2017-02-18 MED ORDER — MEMANTINE HCL 5 MG PO TABS
5.0000 mg | ORAL_TABLET | Freq: Two times a day (BID) | ORAL | Status: DC
  Administered 2017-02-18 – 2017-02-20 (×4): 5 mg via ORAL
  Filled 2017-02-18 (×4): qty 1

## 2017-02-18 MED ORDER — GABAPENTIN 300 MG PO CAPS
300.0000 mg | ORAL_CAPSULE | Freq: Three times a day (TID) | ORAL | Status: DC
  Administered 2017-02-18 – 2017-02-20 (×6): 300 mg via ORAL
  Filled 2017-02-18 (×6): qty 1

## 2017-02-18 MED ORDER — ALPRAZOLAM 0.5 MG PO TABS
0.2500 mg | ORAL_TABLET | Freq: Every evening | ORAL | Status: DC | PRN
  Administered 2017-02-18 – 2017-02-19 (×2): 0.25 mg via ORAL
  Filled 2017-02-18 (×2): qty 1

## 2017-02-18 MED ORDER — BUSPIRONE HCL 5 MG PO TABS
7.5000 mg | ORAL_TABLET | Freq: Two times a day (BID) | ORAL | Status: DC
  Administered 2017-02-18 – 2017-02-20 (×4): 7.5 mg via ORAL
  Filled 2017-02-18 (×2): qty 2
  Filled 2017-02-18 (×2): qty 1
  Filled 2017-02-18: qty 2

## 2017-02-18 MED ORDER — FLEET ENEMA 7-19 GM/118ML RE ENEM
1.0000 | ENEMA | Freq: Once | RECTAL | Status: AC
Start: 2017-02-18 — End: 2017-02-18
  Administered 2017-02-18: 21:00:00 1 via RECTAL

## 2017-02-18 MED ORDER — MORPHINE SULFATE (PF) 4 MG/ML IV SOLN: 4.0000 mg | Freq: Once | INTRAVENOUS | Status: DC

## 2017-02-18 MED ORDER — VITAMIN B-12 1000 MCG PO TABS
1000.0000 ug | ORAL_TABLET | Freq: Every day | ORAL | Status: DC
  Administered 2017-02-18 – 2017-02-20 (×3): 1000 ug via ORAL
  Filled 2017-02-18 (×3): qty 1

## 2017-02-18 MED ORDER — MOMETASONE FURO-FORMOTEROL FUM 200-5 MCG/ACT IN AERO
2.0000 | INHALATION_SPRAY | Freq: Two times a day (BID) | RESPIRATORY_TRACT | Status: DC
  Administered 2017-02-18 – 2017-02-20 (×4): 2 via RESPIRATORY_TRACT
  Filled 2017-02-18: qty 8.8

## 2017-02-18 MED ORDER — SODIUM CHLORIDE 0.9 % IV SOLN
Freq: Once | INTRAVENOUS | Status: AC
  Administered 2017-02-18: 14:00:00 via INTRAVENOUS

## 2017-02-18 MED ORDER — PIPERACILLIN-TAZOBACTAM 3.375 G IVPB 30 MIN
3.3750 g | Freq: Once | INTRAVENOUS | Status: AC
  Administered 2017-02-18: 3.375 g via INTRAVENOUS
  Filled 2017-02-18: qty 50

## 2017-02-18 MED ORDER — ACETAMINOPHEN 325 MG PO TABS
650.0000 mg | ORAL_TABLET | Freq: Four times a day (QID) | ORAL | Status: DC | PRN
  Filled 2017-02-18: qty 2

## 2017-02-18 MED ORDER — MAGNESIUM CITRATE PO SOLN
ORAL | Status: AC
  Administered 2017-02-18: 1 via ORAL
  Filled 2017-02-18: qty 296

## 2017-02-18 MED ORDER — POLYETHYLENE GLYCOL 3350 17 G PO PACK
PACK | ORAL | Status: AC
  Administered 2017-02-18: 17 g via ORAL
  Filled 2017-02-18: qty 1

## 2017-02-18 MED ORDER — LEVOTHYROXINE SODIUM 50 MCG PO TABS
50.0000 ug | ORAL_TABLET | Freq: Every day | ORAL | Status: DC
  Administered 2017-02-18 – 2017-02-20 (×3): 50 ug via ORAL
  Filled 2017-02-18 (×3): qty 1

## 2017-02-18 MED ORDER — HYDROCOD POLST-CPM POLST ER 10-8 MG/5ML PO SUER
5.0000 mL | Freq: Two times a day (BID) | ORAL | Status: DC
  Administered 2017-02-18 – 2017-02-20 (×4): 5 mL via ORAL
  Filled 2017-02-18 (×4): qty 5

## 2017-02-18 MED ORDER — IOPAMIDOL (ISOVUE-300) INJECTION 61%
100.0000 mL | Freq: Once | INTRAVENOUS | Status: AC | PRN
  Administered 2017-02-18: 100 mL via INTRAVENOUS

## 2017-02-18 MED ORDER — SODIUM CHLORIDE 0.9% FLUSH: 3.0000 mL | INTRAVENOUS | Status: DC | PRN

## 2017-02-18 MED ORDER — ONDANSETRON HCL 4 MG PO TABS: 4.0000 mg | ORAL_TABLET | Freq: Four times a day (QID) | ORAL | Status: DC | PRN

## 2017-02-18 MED ORDER — DOCUSATE SODIUM 100 MG PO CAPS
200.0000 mg | ORAL_CAPSULE | Freq: Two times a day (BID) | ORAL | Status: DC
  Administered 2017-02-18 – 2017-02-20 (×5): 200 mg via ORAL
  Filled 2017-02-18 (×4): qty 2

## 2017-02-18 MED ORDER — ALBUTEROL SULFATE (2.5 MG/3ML) 0.083% IN NEBU: 2.5000 mg | INHALATION_SOLUTION | Freq: Four times a day (QID) | RESPIRATORY_TRACT | Status: DC | PRN

## 2017-02-18 MED ORDER — PRAVASTATIN SODIUM 40 MG PO TABS
20.0000 mg | ORAL_TABLET | Freq: Every day | ORAL | Status: DC
  Administered 2017-02-18 – 2017-02-20 (×3): 20 mg via ORAL
  Filled 2017-02-18 (×3): qty 1

## 2017-02-18 MED ORDER — ONDANSETRON HCL 4 MG/2ML IJ SOLN: 4.0000 mg | Freq: Four times a day (QID) | INTRAMUSCULAR | Status: DC | PRN

## 2017-02-18 MED ORDER — OCUVITE-LUTEIN PO CAPS
1.0000 | ORAL_CAPSULE | Freq: Every day | ORAL | Status: DC
  Administered 2017-02-18 – 2017-02-20 (×3): 1 via ORAL
  Filled 2017-02-18 (×4): qty 1

## 2017-02-18 MED ORDER — SENNA 8.6 MG PO TABS
2.0000 | ORAL_TABLET | Freq: Every day | ORAL | Status: DC
  Administered 2017-02-18 – 2017-02-20 (×3): 17.2 mg via ORAL
  Filled 2017-02-18 (×3): qty 2

## 2017-02-18 MED ORDER — OXYBUTYNIN CHLORIDE 5 MG PO TABS
5.0000 mg | ORAL_TABLET | Freq: Two times a day (BID) | ORAL | Status: DC
  Administered 2017-02-18 – 2017-02-20 (×4): 5 mg via ORAL
  Filled 2017-02-18 (×4): qty 1

## 2017-02-18 MED ORDER — HYDROCODONE-ACETAMINOPHEN 7.5-325 MG PO TABS
1.0000 | ORAL_TABLET | Freq: Four times a day (QID) | ORAL | Status: DC | PRN
  Administered 2017-02-18 – 2017-02-19 (×3): 1 via ORAL
  Filled 2017-02-18 (×3): qty 1

## 2017-02-18 MED ORDER — MAGNESIUM CITRATE PO SOLN
1.0000 | Freq: Once | ORAL | Status: AC
  Administered 2017-02-18: 1 via ORAL

## 2017-02-18 MED ORDER — PANTOPRAZOLE SODIUM 40 MG PO TBEC
40.0000 mg | DELAYED_RELEASE_TABLET | Freq: Two times a day (BID) | ORAL | Status: DC
  Administered 2017-02-18 – 2017-02-20 (×4): 40 mg via ORAL
  Filled 2017-02-18 (×4): qty 1

## 2017-02-18 MED ORDER — DONEPEZIL HCL 5 MG PO TABS
5.0000 mg | ORAL_TABLET | Freq: Every day | ORAL | Status: DC
  Administered 2017-02-18 – 2017-02-20 (×3): 5 mg via ORAL
  Filled 2017-02-18 (×3): qty 1

## 2017-02-18 MED ORDER — SODIUM CHLORIDE 0.9% FLUSH
3.0000 mL | Freq: Two times a day (BID) | INTRAVENOUS | Status: DC
  Administered 2017-02-18 – 2017-02-20 (×4): 3 mL via INTRAVENOUS

## 2017-02-18 MED ORDER — ZOLPIDEM TARTRATE 5 MG PO TABS: 5.0000 mg | ORAL_TABLET | Freq: Every evening | ORAL | Status: DC | PRN

## 2017-02-18 MED ORDER — POLYETHYLENE GLYCOL 3350 17 G PO PACK
17.0000 g | PACK | Freq: Every day | ORAL | Status: DC
  Administered 2017-02-18 – 2017-02-20 (×3): 17 g via ORAL
  Filled 2017-02-18 (×2): qty 1

## 2017-02-18 MED ORDER — VITAMIN E 180 MG (400 UNIT) PO CAPS
400.0000 [IU] | ORAL_CAPSULE | Freq: Every day | ORAL | Status: DC
  Administered 2017-02-18 – 2017-02-20 (×3): 400 [IU] via ORAL
  Filled 2017-02-18 (×4): qty 1

## 2017-02-18 MED ORDER — VITAMIN C 500 MG PO TABS
500.0000 mg | ORAL_TABLET | Freq: Every day | ORAL | Status: DC
  Administered 2017-02-18 – 2017-02-20 (×3): 500 mg via ORAL
  Filled 2017-02-18 (×3): qty 1

## 2017-02-18 MED ORDER — DOCUSATE SODIUM 100 MG PO CAPS
ORAL_CAPSULE | ORAL | Status: AC
  Administered 2017-02-18: 200 mg via ORAL
  Filled 2017-02-18: qty 2

## 2017-02-18 MED ORDER — ONDANSETRON HCL 4 MG/2ML IJ SOLN
4.0000 mg | Freq: Once | INTRAMUSCULAR | Status: AC
  Administered 2017-02-18: 4 mg via INTRAVENOUS
  Filled 2017-02-18: qty 2

## 2017-02-18 MED ORDER — CALCIUM CARBONATE-VITAMIN D 500-200 MG-UNIT PO TABS
1.0000 | ORAL_TABLET | Freq: Every day | ORAL | Status: DC
  Administered 2017-02-18 – 2017-02-20 (×3): 1 via ORAL
  Filled 2017-02-18 (×3): qty 1

## 2017-02-18 MED ORDER — BUPROPION HCL ER (XL) 150 MG PO TB24
150.0000 mg | ORAL_TABLET | Freq: Every day | ORAL | Status: DC
  Administered 2017-02-18 – 2017-02-20 (×3): 150 mg via ORAL
  Filled 2017-02-18 (×3): qty 1

## 2017-02-18 NOTE — ED Triage Notes (Addendum)
Pt arrives via EMS from home, states severe lower abd pain that began this AM, states she feels as if she would be better if she could have a BM, pt states last BM was 5/9, states nausea, pt moaning, awake and alert, EDP at bedside

## 2017-02-18 NOTE — H&P (Signed)
[facility] NAME: Ariana [name]    MR#:  [id number]  DATE OF BIRTH:  06-13-1933  DATE OF ADMISSION:  02/18/2017  PRIMARY CARE PHYSICIAN: Idelle Crouch, MD   REQUESTING/REFERRING PHYSICIAN: Ulyess Blossom MD  CHIEF COMPLAINT:   Chief Complaint  Patient presents with  . Abdominal Pain    HISTORY OF PRESENT ILLNESS: Ariana [name]  is a 83 y.o. female with a known history of  Anemia, anxiety, asthma, chronic back pain, depression, ITP, hyperlipidemia and GERD who follows Dr. Grayland Ormond recently had blood work yesterday which showed platelets less than 10. Patient comes to the ER with complaint of having abdominal pain in the lower abdomen with cramping. She underwent a CT scan which showed large stool burden. However patient's platelet count is severely low. The emergency room physician contacted Dr. Grayland Ormond who recommends admission due to the fact that patient was not able able to get the medication she needed for her thrombocytopenia because it was not approved through her insurance according to the ER physician. Patient otherwise denies any bleeding no hematemesis hematochezia she actually reports that she's been having some loose stools. PAST MEDICAL HISTORY:   Past Medical History:  Diagnosis Date  . Anemia   . Anxiety   . Asthma   . Chronic back pain   . Depression   . Depression   . GERD (gastroesophageal reflux disease)   . Hiatal hernia   . Hypercholesteremia   . ITP (idiopathic thrombocytopenic purpura)   . Osteoarthritis   . Osteoporosis   . Pulmonary emboli ([place])   . Restless legs     PAST SURGICAL HISTORY: Past Surgical History:  Procedure Laterality Date  . CARPAL TUNNEL RELEASE    . ESOPHAGOGASTRODUODENOSCOPY (EGD) WITH PROPOFOL N/A 09/10/2015   Procedure: ESOPHAGOGASTRODUODENOSCOPY (EGD) WITH PROPOFOL;  Surgeon: Lollie Sails, MD;  Location: [facility] ENDOSCOPY;  Service: Endoscopy;  Laterality: N/A;   . LUMBAR [place]    . SPLENECTOMY, PARTIAL    . TOTAL ABDOMINAL HYSTERECTOMY      SOCIAL HISTORY:  Social History  Substance Use Topics  . Smoking status: Never Smoker  . Smokeless tobacco: Never Used  . Alcohol use No    FAMILY HISTORY:  Family History  Problem Relation Age of Onset  . Stroke Mother     DRUG ALLERGIES:  Allergies  Allergen Reactions  . Aspirin     Other reaction(s): Distress (finding)  . Diazepam     Other reaction(s): Itching of Skin sneezing  . Morphine     Other reaction(s): Itching of Skin  . Other     Other reaction(s): Unknown seasonal allergies  . Tetanus Toxoids     Other reaction(s): Localized superficial swelling of skin    REVIEW OF SYSTEMS:   CONSTITUTIONAL: No fever, fatigue or weakness.  EYES: No blurred or double vision.  EARS, NOSE, AND THROAT: No tinnitus or ear pain.  RESPIRATORY: No cough, shortness of breath, wheezing or hemoptysis.  CARDIOVASCULAR: No chest pain, orthopnea, edema.  GASTROINTESTINAL: No nausea, vomiting, positive diarrhea or positive abdominal pain.  GENITOURINARY: No dysuria, hematuria.  ENDOCRINE: No polyuria, nocturia,  HEMATOLOGY: No anemia, easy bruising or bleeding SKIN: No rash or lesion. MUSCULOSKELETAL: No joint pain or arthritis.   NEUROLOGIC: No tingling, numbness, weakness.  PSYCHIATRY: No anxiety or depression.   MEDICATIONS AT HOME:  Prior to Admission medications   Medication Sig Start Date End Date Taking? Authorizing Provider  acetaminophen (TYLENOL)  325 MG tablet Take 2 tablets (650 mg total) by mouth every 6 (six) hours as needed for mild pain (or Fever >/= 101). 07/02/15  Yes Idelle Crouch, MD  albuterol (PROVENTIL HFA;VENTOLIN HFA) 108 (90 BASE) MCG/ACT inhaler Inhale 2 puffs into the lungs every 6 (six) hours as needed for wheezing or shortness of breath.   Yes [provider]  ALPRAZolam (XANAX) 0.25 MG tablet TAKE 1 TABLET TWICE DAILY AS NEEDED FOR SLEEP 09/11/15   Yes [provider]  buPROPion (WELLBUTRIN XL) 150 MG 24 hr tablet Take 1 tablet by mouth daily. 07/14/16 07/14/17 Yes [provider]  busPIRone (BUSPAR) 15 MG tablet Take 7.5 mg by mouth 2 (two) times daily.   Yes [provider]  Calcium Carbonate-Vitamin D (CALCIUM 600+D) 600-400 MG-UNIT per tablet Take 1 tablet by mouth daily.   Yes [provider]  citalopram (CELEXA) 40 MG tablet Take 40 mg by mouth daily.   Yes [provider]  Cyanocobalamin (RA VITAMIN B-12 TR) 1000 MCG TBCR Take 1,000 mcg by mouth daily.    Yes [provider]  donepezil (ARICEPT) 5 MG tablet Take 5 mg by mouth daily. 10/18/14  Yes [provider]  Fluticasone-Salmeterol (ADVAIR) 250-50 MCG/DOSE AEPB Inhale 1 puff into the lungs 2 (two) times daily.   Yes [provider]  gabapentin (NEURONTIN) 300 MG capsule 300mg  orally 3 times a day 07/23/14  Yes [provider]  HYDROcodone-acetaminophen (NORCO) 7.5-325 MG tablet 1 tablet by mouth 3 times daily 11/14/16  Yes [provider]  levothyroxine (SYNTHROID, LEVOTHROID) 50 MCG tablet Take 50 mcg by mouth daily before breakfast. Take 30 to 60 minutes before breakfast.   Yes [provider]  memantine (NAMENDA) 5 MG tablet Take 5 mg by mouth 2 (two) times daily.  08/19/15 02/18/17 Yes [provider]  Multiple Vitamins-Minerals (OCUVITE-LUTEIN PO) Take 1 capsule by mouth daily.    Yes [provider]  oxybutynin (DITROPAN) 5 MG tablet Take 5 mg by mouth 2 (two) times daily.  08/10/16 08/10/17 Yes [provider]  pantoprazole (PROTONIX) 20 MG tablet Take 20 mg by mouth 2 (two) times daily.  07/24/15  Yes [provider]  pravastatin (PRAVACHOL) 20 MG tablet Take 20 mg by mouth daily.   Yes [provider]  traMADol (ULTRAM) 50 MG tablet Take 50 mg by mouth 3 (three) times daily as needed for moderate pain or severe pain. pain   Yes [provider]  vitamin C (ASCORBIC ACID) 500 MG tablet Take 500 mg by mouth daily.   Yes [provider]  vitamin E (E-400) 400 UNIT capsule Take 400 Units by mouth daily.   Yes [provider]  BUDESONIDE-FORMOTEROL FUMARATE IN Inhale 2 puffs into the lungs 2 (two) times daily as needed (for shortness of breath and wheezing.).     [provider]  chlorpheniramine-HYDROcodone (TUSSIONEX PENNKINETIC ER) 10-8 MG/5ML SUER Take 5 mLs by mouth 2 (two) times daily. Patient not taking: Reported on 02/18/2017 11/22/16   Earleen Newport, MD  LORazepam (ATIVAN) 0.5 MG tablet Take 1 tablet (0.5 mg total) by mouth every 8 (eight) hours as needed for anxiety. Patient not taking: Reported on 02/18/2017 11/22/16 11/22/17  Earleen Newport, MD  zolpidem (AMBIEN) 5 MG tablet Take 5 mg by mouth at bedtime as needed for sleep.    [provider]      PHYSICAL EXAMINATION:   VITAL SIGNS: Blood pressure 138/80, pulse  61, temperature 98.2 F (36.8 C), temperature source Oral, resp. rate 16, weight 147 lb (66.7 kg), SpO2 97 %.  GENERAL:  83 y.o.-year-old patient lying in the bed with no acute distress.  EYES: Pupils equal, round, reactive to light and accommodation. No scleral icterus. Extraocular muscles intact.  HEENT: Head atraumatic, normocephalic. Oropharynx and nasopharynx clear.  NECK:  Supple, no jugular venous distention. No thyroid enlargement, no tenderness.  LUNGS: Normal breath sounds bilaterally, no wheezing, rales,rhonchi or crepitation. No use of accessory muscles of respiration.  CARDIOVASCULAR: S1, S2 normal. No murmurs, rubs, or gallops.  ABDOMEN: Soft, lower abdomen tenderness , nondistended. Bowel sounds dimished. No organomegaly or mass.  EXTREMITIES: No pedal edema, cyanosis, or clubbing.  NEUROLOGIC: Cranial nerves II through XII are intact. Muscle strength 5/5 in all extremities. Sensation intact. Gait not checked.  PSYCHIATRIC: The patient is  alert and oriented x 3.  SKIN: No obvious rash, lesion, or ulcer.   LABORATORY PANEL:   CBC  Recent Labs Lab 02/17/17 1048 02/18/17 1101  WBC 12.6* 24.1*  HGB 13.2 13.1  HCT 39.6 40.5  PLT 10* PLATELET CLUMPS NOTED ON SMEAR, COUNT APPEARS DECREASED  MCV 89.5 90.4  MCH 29.9 29.2  MCHC 33.4 32.3  RDW 15.8* 16.1*  LYMPHSABS 1.5 3.0  MONOABS 0.5 1.1*  EOSABS 0.2 0.4  BASOSABS 0.1 0.1   ------------------------------------------------------------------------------------------------------------------  Chemistries   Recent Labs Lab 02/18/17 1101  NA 137  K 3.2*  CL 105  CO2 21*  GLUCOSE 91  BUN 20  CREATININE 0.78  CALCIUM 8.3*  AST 25  ALT 14  ALKPHOS 60  BILITOT 0.8   ------------------------------------------------------------------------------------------------------------------ estimated creatinine clearance is 49.6 mL/min (by C-G formula based on SCr of 0.78 mg/dL). ------------------------------------------------------------------------------------------------------------------ No results for input(s): TSH, T4TOTAL, T3FREE, THYROIDAB in the last 72 hours.  Invalid input(s): FREET3   Coagulation profile No results for input(s): INR, PROTIME in the last 168 hours. ------------------------------------------------------------------------------------------------------------------- No results for input(s): DDIMER in the last 72 hours. -------------------------------------------------------------------------------------------------------------------  Cardiac Enzymes  Recent Labs Lab 02/18/17 1101  TROPONINI <0.03   ------------------------------------------------------------------------------------------------------------------ Invalid input(s): POCBNP  ---------------------------------------------------------------------------------------------------------------  Urinalysis    Component Value Date/Time   COLORURINE YELLOW (A) 02/18/2017 1104    APPEARANCEUR CLEAR (A) 02/18/2017 1104   APPEARANCEUR Clear 09/03/2014 1200   LABSPEC 1.045 (H) 02/18/2017 1104   LABSPEC 1.018 09/03/2014 1200   PHURINE 7.0 02/18/2017 [place] 02/18/2017 1104   GLUCOSEU Negative 09/03/2014 1200   HGBUR NEGATIVE 02/18/2017 1104   BILIRUBINUR NEGATIVE 02/18/2017 1104   BILIRUBINUR Negative 09/03/2014 1200   KETONESUR 5 (A) 02/18/2017 1104   PROTEINUR NEGATIVE 02/18/2017 1104   NITRITE NEGATIVE 02/18/2017 1104   LEUKOCYTESUR NEGATIVE 02/18/2017 1104   LEUKOCYTESUR Negative 09/03/2014 1200     RADIOLOGY: Ct Abdomen Pelvis W Contrast  Result Date: 02/18/2017 CLINICAL DATA:  Severe lower abdominal pain EXAM: CT ABDOMEN AND PELVIS WITH CONTRAST TECHNIQUE: Multidetector CT imaging of the abdomen and pelvis was performed using the standard protocol following bolus administration of intravenous contrast. CONTRAST:  130mL ISOVUE-300 IOPAMIDOL (ISOVUE-300) INJECTION 61% COMPARISON:  None. FINDINGS: Lower chest: Mild cardiomegaly.  No acute abnormalities. Hepatobiliary: No focal hepatic abnormality. Gallbladder unremarkable. Pancreas: Fatty replacement. No focal abnormality or ductal dilatation. Spleen: Prior splenectomy. Adrenals/Urinary Tract: No adrenal abnormality. No focal renal abnormality. No stones or hydronephrosis. Urinary bladder is unremarkable. Stomach/Bowel: Large stool burden throughout the colon, particularly the left colon. Sigmoid diverticulosis. No active diverticulitis. Stomach and small bowel decompressed. Vascular/Lymphatic: No  evidence of aneurysm or adenopathy. Reproductive: Uterus and adnexa unremarkable.  No mass. Other: No free fluid or free air. Musculoskeletal: Postoperative changes in the lower lumbar spine. No acute bony abnormality. IMPRESSION: Large stool burden throughout the colon, particularly the left colon. Sigmoid diverticulosis. Prior splenectomy. Fatty replacement of the pancreas. Mild cardiomegaly. Electronically  Signed   By: Rolm Baptise M.D.   On: 02/18/2017 12:08   Dg Abd Acute W/chest  Result Date: 02/18/2017 CLINICAL DATA:  Shortness of Breath EXAM: DG ABDOMEN ACUTE W/ 1V CHEST COMPARISON:  None. FINDINGS: Lungs are clear bilaterally. Cardiac shadow is within normal limits. No acute bony abnormality is noted. Degenerative changes about the right shoulder joint are seen. Scattered large and small bowel gas is noted. Fecal material is noted throughout colon consistent with a mild degree of constipation. Postsurgical changes of the lumbosacral junction are seen. No free air is noted. No acute bony abnormality is noted. IMPRESSION: No acute abnormality seen. Mild constipation. Electronically Signed   By: Inez Catalina M.D.   On: 02/18/2017 11:49    EKG: Orders placed or performed during the hospital encounter of 02/18/17  . ED EKG  . ED EKG    IMPRESSION AND PLAN: Patient is a 83 year old with ITP with abdominal pain  1. Abdominal pain due to severe constipation with overflow leading to loose stools I will start patient on Colace senna MiraLAX and will give her a bottle of mag citrate if this does not work may need a Fleet enema  2. Severe thrombocytopenia I have asked oncology to see if further treatment based on the recommendation  3. Leukocytosis could be reactive in nature no evidence infection in her urine or abdomen patient otherwise afebrile we'll follow her CBC  4. History of asthma we'll continue her inhalers  5. Anxiety depression continue her oral regimen as taking at home with Xanax and BuSpar Celexa  6. Hypothyroidism continue Synthroid  7. GERD continue Protonix  8. misc: scd's    All the records are reviewed and case discussed with ED provider. Management plans discussed with the patient, family and they are in agreement.  CODE STATUS: Code Status History    Date Active Date Inactive Code Status Order ID Comments User Context   06/29/2015 10:12 PM 07/02/2015  6:17 PM Full  Code [id number]  Gladstone Lighter, MD Inpatient       TOTAL TIME TAKING CARE OF THIS PATIENT: 55 minutes.    Dustin Flock M.D on 02/18/2017 at 2:26 PM  Between 7am to 6pm - Pager - [phone]  After 6pm go to [email or web address] - password [email or web address] [email or web address]  Office  [phone]  CC: Primary care physician; Idelle Crouch, MD

## 2017-02-18 NOTE — ED Notes (Signed)
Patient transported to X-ray 

## 2017-02-18 NOTE — Telephone Encounter (Signed)
Called patient to let her know that we will see her today and giver her nplate but son notified me she was on her way by ambulance to the ER

## 2017-02-18 NOTE — ED Notes (Signed)
Pt returned from xray, resting in bed, aware of pending CT scan, family at bedside

## 2017-02-18 NOTE — ED Notes (Signed)
Placed pt on bed pan.

## 2017-02-18 NOTE — ED Notes (Signed)
Pt urinated with small amount of stool in bedpan. Pt cleaned and chuck changed underneath pt. Pt moved up in bed for comfort, lights dimmed.

## 2017-02-18 NOTE — ED Notes (Signed)
Pt returned from CT °

## 2017-02-18 NOTE — ED Notes (Signed)
Pt placed on bed pan, Dr. Jimmye Norman made aware of critical platlet level

## 2017-02-18 NOTE — ED Provider Notes (Signed)
[facility] Emergency Department Provider Note       Time seen: ----------------------------------------- 10:57 AM on 02/18/2017 -----------------------------------------  L5 caveat: Review of systems history is limited by dementia   I have reviewed the triage vital signs and the nursing notes.   HISTORY   Chief Complaint No chief complaint on file.    HPI Ariana [name] is a 83 y.o. female who presents to the ED for severe lower abdominal pain that began this morning. Patient states it feels as if she needs to have a bowel movement and that she would feel better if she could have a bowel movement. She reports her bowel movement was normal yesterday. She's had nausea but denies other complaints at this time.   Past Medical History:  Diagnosis Date  . Anemia   . Anxiety   . Asthma   . Chronic back pain   . Depression   . Depression   . GERD (gastroesophageal reflux disease)   . Hiatal hernia   . Hypercholesteremia   . ITP (idiopathic thrombocytopenic purpura)   . Osteoarthritis   . Osteoporosis   . Pulmonary emboli ([place])   . Restless legs     Patient Active Problem List   Diagnosis Date Noted  . Asthma without status asthmaticus 11/25/2016  . Late onset Alzheimer's disease without behavioral disturbance 12/30/2015  . Back muscle spasm 09/09/2015  . Clinical depression 04/12/2015  . Herpes zona 04/12/2015  . Chronic ITP (idiopathic thrombocytopenic purpura) (HCC) 03/15/2015  . Chest pain 10/22/2014  . Breathlessness on exertion 10/22/2014  . Osteopenia 10/18/2014  . Benign essential HTN 06/28/2014  . HLD (hyperlipidemia) 06/28/2014  . Idiopathic thrombocythemia ([place]) 06/28/2014  . Acquired hypothyroidism 06/04/2014  . Anxiety and depression 06/04/2014  . Bursitis, trochanteric 03/27/2014  . DDD (degenerative disc disease), lumbar 02/27/2014  . Neuritis or radiculitis due to rupture of lumbar intervertebral disc 02/27/2014     Past Surgical History:  Procedure Laterality Date  . CARPAL TUNNEL RELEASE    . ESOPHAGOGASTRODUODENOSCOPY (EGD) WITH PROPOFOL N/A 09/10/2015   Procedure: ESOPHAGOGASTRODUODENOSCOPY (EGD) WITH PROPOFOL;  Surgeon: Lollie Sails, MD;  Location: [facility] ENDOSCOPY;  Service: Endoscopy;  Laterality: N/A;  . LUMBAR [place]    . SPLENECTOMY, PARTIAL    . TOTAL ABDOMINAL HYSTERECTOMY      Allergies Aspirin; Diazepam; Morphine; Other; and Tetanus toxoids  Social History Social History  Substance Use Topics  . Smoking status: Never Smoker  . Smokeless tobacco: Never Used  . Alcohol use No    Review of Systems Constitutional: Negative for fever. Cardiovascular: Negative for chest pain. Respiratory: Negative for shortness of breath. Gastrointestinal: Positive for abdominal pain and nausea Genitourinary: Negative for dysuria. Musculoskeletal: Negative for back pain. Skin: Negative for rash. Neurological: Negative for headaches, focal weakness or numbness.  All systems negative/normal/unremarkable except as stated in the HPI  ____________________________________________   PHYSICAL EXAM:  VITAL SIGNS: ED Triage Vitals  Enc Vitals Group     BP      Pulse      Resp      Temp      Temp src      SpO2      Weight      Height      Head Circumference      Peak Flow      Pain Score      Pain Loc      Pain Edu?      Excl. in [place]?  Constitutional: Well appearing but mild to moderate distress Eyes: Conjunctivae are normal.  Normal extraocular movements. ENT   Head: Normocephalic and atraumatic.   Nose: No congestion/rhinnorhea.   Mouth/Throat: Mucous membranes are moist.   Neck: No stridor. Cardiovascular: Normal rate, regular rhythm. No murmurs, rubs, or gallops. Respiratory: Normal respiratory effort without tachypnea nor retractions. Breath sounds are clear and equal bilaterally. No wheezes/rales/rhonchi. Gastrointestinal: Distended, nonfocal  tenderness. Hypoactive bowel sounds. Rectal: No gross blood or stool, nontender, trace heme positive Musculoskeletal: Nontender with normal range of motion in extremities. No lower extremity tenderness nor edema. Neurologic:  Normal speech and language. No gross focal neurologic deficits are appreciated.  Skin:  Skin is warm, dry and intact. No rash noted. Psychiatric: Mood and affect are normal. Speech and behavior are normal.  ____________________________________________  EKG: Interpreted by me. Sinus rhythm with rate of 53 bpm, normal PR interval, normal QRS, normal QT, nonspecific ST changes  ____________________________________________  ED COURSE:  Pertinent labs & imaging results that were available during my care of the patient were reviewed by me and considered in my medical decision making (see chart for details). Patient presents for abdominal pain and nausea, we will assess with labs and imaging as indicated.   Procedures ____________________________________________   LABS (pertinent positives/negatives)  Labs Reviewed  CBC WITH DIFFERENTIAL/PLATELET - Abnormal; Notable for the following:       Result Value   WBC 24.1 (*)    RDW 16.1 (*)    Neutro Abs 19.5 (*)    Monocytes Absolute 1.1 (*)    All other components within normal limits  COMPREHENSIVE METABOLIC PANEL - Abnormal; Notable for the following:    Potassium 3.2 (*)    CO2 21 (*)    Calcium 8.3 (*)    Total Protein 6.2 (*)    All other components within normal limits  URINALYSIS, COMPLETE (UACMP) WITH MICROSCOPIC - Abnormal; Notable for the following:    Color, Urine YELLOW (*)    APPearance CLEAR (*)    Specific Gravity, Urine 1.045 (*)    Ketones, ur 5 (*)    Squamous Epithelial / LPF 0-5 (*)    All other components within normal limits  LIPASE, BLOOD  TROPONIN I  LACTIC ACID, PLASMA    RADIOLOGY Images were viewed by me  Acute abdominal series IMPRESSION: No acute abnormality seen.  Mild  constipation. IMPRESSION: Large stool burden throughout the colon, particularly the left colon. Sigmoid diverticulosis.  Prior splenectomy.  Fatty replacement of the pancreas.  Mild cardiomegaly. ____________________________________________  FINAL ASSESSMENT AND PLAN  Abdominal pain, nausea, thrombocytopenia, leukocytosis  Plan: Patient's labs and imaging were dictated above. Patient had presented for abdominal pain and general ill feeling. She appears mildly constipated but overall CT scan was reassuring. There was certainly concern for intra-abdominal or retroperitoneal hemorrhage with her thrombocytopenia. Given her leukocytosis I will recommend observation, I discussed with oncology who will arrange for Nplate infusion. Family is agreeable to plan.   Earleen Newport, MD   Note: This note was generated in part or whole with voice recognition software. Voice recognition is usually quite accurate but there are transcription errors that can and very often do occur. I apologize for any typographical errors that were not detected and corrected.     Earleen Newport, MD 02/18/17 [phone]

## 2017-02-18 NOTE — ED Notes (Signed)
Pt resting in bed, pt aware of pending admission, states she wants to take a nap, lights dimmed

## 2017-02-19 ENCOUNTER — Other Ambulatory Visit: Payer: Self-pay | Admitting: Oncology

## 2017-02-19 DIAGNOSIS — R531 Weakness: Secondary | ICD-10-CM

## 2017-02-19 DIAGNOSIS — R109 Unspecified abdominal pain: Secondary | ICD-10-CM

## 2017-02-19 DIAGNOSIS — R630 Anorexia: Secondary | ICD-10-CM

## 2017-02-19 DIAGNOSIS — M1990 Unspecified osteoarthritis, unspecified site: Secondary | ICD-10-CM

## 2017-02-19 DIAGNOSIS — K5900 Constipation, unspecified: Secondary | ICD-10-CM

## 2017-02-19 DIAGNOSIS — K219 Gastro-esophageal reflux disease without esophagitis: Secondary | ICD-10-CM

## 2017-02-19 DIAGNOSIS — D693 Immune thrombocytopenic purpura: Principal | ICD-10-CM

## 2017-02-19 DIAGNOSIS — R233 Spontaneous ecchymoses: Secondary | ICD-10-CM

## 2017-02-19 DIAGNOSIS — E7800 Pure hypercholesterolemia, unspecified: Secondary | ICD-10-CM

## 2017-02-19 DIAGNOSIS — R5381 Other malaise: Secondary | ICD-10-CM

## 2017-02-19 DIAGNOSIS — Z79899 Other long term (current) drug therapy: Secondary | ICD-10-CM

## 2017-02-19 DIAGNOSIS — R5383 Other fatigue: Secondary | ICD-10-CM

## 2017-02-19 LAB — BASIC METABOLIC PANEL
ANION GAP: 4 — AB (ref 5–15)
BUN: 20 mg/dL (ref 6–20)
CALCIUM: 8.5 mg/dL — AB (ref 8.9–10.3)
CO2: 29 mmol/L (ref 22–32)
CREATININE: 0.97 mg/dL (ref 0.44–1.00)
Chloride: 105 mmol/L (ref 101–111)
GFR calc Af Amer: >60 mL/min (ref >60)
GFR, EST NON AFRICAN AMERICAN: 53 mL/min — AB (ref >60)
GLUCOSE: 82 mg/dL (ref 65–99)
Potassium: 3.8 mmol/L (ref 3.5–5.1)
Sodium: 138 mmol/L (ref 135–145)

## 2017-02-19 LAB — CBC
HCT: 39.2 % (ref 35.0–47.0)
Hemoglobin: 12.8 g/dL (ref 12.0–16.0)
MCH: 29.5 pg (ref 26.0–34.0)
MCHC: 32.6 g/dL (ref 32.0–36.0)
MCV: 90.4 fL (ref 80.0–100.0)
Platelets: 10 10*3/uL — CL (ref 150–440)
RBC: 4.34 MIL/uL (ref 3.80–5.20)
RDW: 16.1 % — AB (ref 11.5–14.5)
WBC: 14.4 10*3/uL — AB (ref 3.6–11.0)

## 2017-02-19 MED ORDER — ROMIPLOSTIM 250 MCG ~~LOC~~ SOLR
250.0000 ug | Freq: Once | SUBCUTANEOUS | Status: AC
  Administered 2017-02-19: 15:00:00 250 ug via SUBCUTANEOUS
  Filled 2017-02-19: qty 0.5

## 2017-02-19 NOTE — Care Management Note (Signed)
Case Management Note  Patient Details  Name: Ariana [name] MRN: [id number] Date of Birth: June 16, 1933  Subjective/Objective:     Admitted to this facility with the diagnosis of thrombocytopenia. Lives with husband, Kyung Rudd [phone]). Last seen Dr. Doy Hutching 3 months ago. Prescriptions are filled at [facility] on [address]. [facility] about 4-5 months ago.       No skilled facility. No home oxygen. Grab bars, shower chair, raised toilet seat (but not high enough) and cane in the home. Takes care of all basic activities of daily living herself, can drive but doesn't drive. No falls. Fair appetite. Receiving no chemotherapy or radiation. Either son or husband will transport.         Action/Plan:  Will continue to follow for discharge needs.    Expected Discharge Date:  02/22/17               Expected Discharge Plan:     In-House Referral:     Discharge planning Services     Post Acute Care Choice:    Choice offered to:     DME Arranged:    DME Agency:     HH Arranged:    HH Agency:     Status of Service:     If discussed at [employer] [facility] [employer], dates discussed:    Additional Comments:  Shelbie Ammons, RN MSN CCM Care Management [phone] 02/19/2017, 11:22 AM

## 2017-02-19 NOTE — Progress Notes (Signed)
[facility] [facility] [place] NAME: Ariana [name]    MR#:  [id number]  DATE OF BIRTH:  Oct 29, 1932  SUBJECTIVE:   Patient here due to severe thrombocytopenia with platelet count less than 10 and also having some abdominal pain secondary to constipation. Patient has had multiple stools since yesterday and abdominal pain has improved. No nausea no vomiting.  REVIEW OF SYSTEMS:    Review of Systems  Constitutional: Negative for chills and fever.  HENT: Negative for congestion and tinnitus.   Eyes: Negative for blurred vision and double vision.  Respiratory: Negative for cough, shortness of breath and wheezing.   Cardiovascular: Negative for chest pain, orthopnea and PND.  Gastrointestinal: Negative for abdominal pain, diarrhea, nausea and vomiting.  Genitourinary: Negative for dysuria and hematuria.  Neurological: Negative for dizziness, sensory change and focal weakness.  All other systems reviewed and are negative.   Nutrition: Heart Healthy Tolerating Diet: Yes Tolerating PT: Ambulatory   DRUG ALLERGIES:   Allergies  Allergen Reactions  . Aspirin     Other reaction(s): Distress (finding)  . Diazepam     Other reaction(s): Itching of Skin sneezing  . Morphine     Other reaction(s): Itching of Skin  . Other     Other reaction(s): Unknown seasonal allergies  . Tetanus Toxoids     Other reaction(s): Localized superficial swelling of skin    VITALS:  Blood pressure (!) 102/49, pulse 60, temperature 98.3 F (36.8 C), temperature source Oral, resp. rate 18, height 5\' 5"  (1.651 m), weight 64.5 kg (142 lb 3.2 oz), SpO2 98 %.  PHYSICAL EXAMINATION:   Physical Exam  GENERAL:  83 y.o.-year-old patient lying in bed in no acute distress.  EYES: Pupils equal, round, reactive to light and accommodation. No scleral icterus. Extraocular muscles intact.  HEENT: Head atraumatic, normocephalic. Oropharynx and nasopharynx clear.  NECK:  Supple,  no jugular venous distention. No thyroid enlargement, no tenderness.  LUNGS: Normal breath sounds bilaterally, no wheezing, rales, rhonchi. No use of accessory muscles of respiration.  CARDIOVASCULAR: S1, S2 normal. No murmurs, rubs, or gallops.  ABDOMEN: Soft, nontender, nondistended. Bowel sounds present. No organomegaly or mass.  EXTREMITIES: No cyanosis, clubbing or edema b/l.    NEUROLOGIC: Cranial nerves II through XII are intact. No focal Motor or sensory deficits b/l.   PSYCHIATRIC: The patient is alert and oriented x 3.  SKIN: No obvious rash, lesion, or ulcer. Bruises noted on both her upper & lower Ext.    LABORATORY PANEL:   CBC  Recent Labs Lab 02/19/17 0413  WBC 14.4*  HGB 12.8  HCT 39.2  PLT 10*   ------------------------------------------------------------------------------------------------------------------  Chemistries   Recent Labs Lab 02/18/17 1101 02/19/17 0413  NA 137 138  K 3.2* 3.8  CL 105 105  CO2 21* 29  GLUCOSE 91 82  BUN 20 20  CREATININE 0.78 0.97  CALCIUM 8.3* 8.5*  AST 25  --   ALT 14  --   ALKPHOS 60  --   BILITOT 0.8  --    ------------------------------------------------------------------------------------------------------------------  Cardiac Enzymes  Recent Labs Lab 02/18/17 1101  TROPONINI <0.03   ------------------------------------------------------------------------------------------------------------------  RADIOLOGY:  Ct Abdomen Pelvis W Contrast  Result Date: 02/18/2017 CLINICAL DATA:  Severe lower abdominal pain EXAM: CT ABDOMEN AND PELVIS WITH CONTRAST TECHNIQUE: Multidetector CT imaging of the abdomen and pelvis was performed using the standard protocol following bolus administration of intravenous contrast. CONTRAST:  156mL ISOVUE-300 IOPAMIDOL (ISOVUE-300) INJECTION 61% COMPARISON:  None. FINDINGS: Lower chest: Mild cardiomegaly.  No acute abnormalities. Hepatobiliary: No focal hepatic abnormality. Gallbladder  unremarkable. Pancreas: Fatty replacement. No focal abnormality or ductal dilatation. Spleen: Prior splenectomy. Adrenals/Urinary Tract: No adrenal abnormality. No focal renal abnormality. No stones or hydronephrosis. Urinary bladder is unremarkable. Stomach/Bowel: Large stool burden throughout the colon, particularly the left colon. Sigmoid diverticulosis. No active diverticulitis. Stomach and small bowel decompressed. Vascular/Lymphatic: No evidence of aneurysm or adenopathy. Reproductive: Uterus and adnexa unremarkable.  No mass. Other: No free fluid or free air. Musculoskeletal: Postoperative changes in the lower lumbar spine. No acute bony abnormality. IMPRESSION: Large stool burden throughout the colon, particularly the left colon. Sigmoid diverticulosis. Prior splenectomy. Fatty replacement of the pancreas. Mild cardiomegaly. Electronically Signed   By: Rolm Baptise M.D.   On: 02/18/2017 12:08   Dg Abd Acute W/chest  Result Date: 02/18/2017 CLINICAL DATA:  Shortness of Breath EXAM: DG ABDOMEN ACUTE W/ 1V CHEST COMPARISON:  None. FINDINGS: Lungs are clear bilaterally. Cardiac shadow is within normal limits. No acute bony abnormality is noted. Degenerative changes about the right shoulder joint are seen. Scattered large and small bowel gas is noted. Fecal material is noted throughout colon consistent with a mild degree of constipation. Postsurgical changes of the lumbosacral junction are seen. No free air is noted. No acute bony abnormality is noted. IMPRESSION: No acute abnormality seen. Mild constipation. Electronically Signed   By: Inez Catalina M.D.   On: 02/18/2017 11:49     ASSESSMENT AND PLAN:   83 year old female with past medical history of ITP, restless leg syndrome, GERD, depression, anxiety who presented to the hospital due to abdominal pain and also noted to be severely thrombocytopenic.  1. Abdominal pain-secondary to constipation. Patient CT scan of the abdomen and pelvis was  suggestive of a large stool burden in the sigmoid colon. Patient has received laxatives and this is much improved now. Patient had multiple loose stools overnight. -Continue supportive care for now.  2. Thrombocytopenia-secondary to ITP.Patient's platelet count was less than 10. no acute bleeding or bruising noted. -await hematology oncology input. - pt. To receive a dose of nplate (RomiPLostim) as per Hem/Onc.  - follow Plt. Count.   3. Anxiety/Depression - cont. Wellbutrin/Buspar, Xanax, Celexa.   4. Dementia - cont. Aricept, Namenda  5. Hypothyroidism - cont. Synthroid.   6. Neuropathy - cont. Gabapentin  7. Hyperlipidemia - cont. Pravachol.     All the records are reviewed and case discussed with Care Management/Social Worker. Management plans discussed with the patient, family and they are in agreement.  CODE STATUS: Full code  DVT Prophylaxis: Ted's & SCD's.   TOTAL TIME TAKING CARE OF THIS PATIENT: 30 minutes.   POSSIBLE D/C IN 1-2 DAYS, DEPENDING ON CLINICAL CONDITION.   Henreitta Leber M.D on 02/19/2017 at 1:44 PM  Between 7am to 6pm - Pager - [phone]  After 6pm go to [email or web address] - Proofreader  [facility]  Office  [phone]  CC: Primary care physician; Idelle Crouch, MD

## 2017-02-19 NOTE — Consult Note (Signed)
[place]  [phone] [phone]) [phone]  ID: Liane Tribbey OB: 1933/08/03  MR#: [id number]  YIF#:[id number]  Patient Care Team: Idelle Crouch, MD as PCP - General (Internal Medicine)  CHIEF COMPLAINT: Severe thrombocytopenia, abdominal pain.  INTERVAL HISTORY: Patient is a 83 year old female with known chronic ITP who is scheduled to get her Nplate injection earlier this week, but treatment was denied by insurance. Unrelated, she was seen in the emergency room with severe abdominal pain. CT scan was essentially negative and her symptoms were thought to be secondary to severe constipation. Currently, patient feels improved but not back to her baseline. She has no neurologic complaints. She denies any recent fevers or illnesses. She has a poor appetite, but denies weight loss. She does not complain of easy bleeding, but has noticed increased bruising. She has no melena or hematochezia. She has no urinary complaints. Patient offers no further specific complaints.  REVIEW OF SYSTEMS:   Review of Systems  Constitutional: Positive for malaise/fatigue. Negative for fever and weight loss.  Respiratory: Negative.  Negative for cough, hemoptysis and shortness of breath.   Cardiovascular: Negative.  Negative for chest pain and leg swelling.  Gastrointestinal: Positive for abdominal pain and constipation.  Genitourinary: Negative.   Skin: Negative.  Negative for rash.  Neurological: Positive for weakness.  Endo/Heme/Allergies: Bruises/bleeds easily.  Psychiatric/Behavioral: Negative.  The patient is not nervous/anxious.     As per HPI. Otherwise, a complete review of systems is negative.  PAST MEDICAL HISTORY: Past Medical History:  Diagnosis Date  . Anemia   . Anxiety   . Asthma   . Chronic back pain   . Depression   . Depression   . GERD (gastroesophageal reflux disease)   . Hiatal hernia   . Hypercholesteremia   . ITP (idiopathic  thrombocytopenic purpura)   . Osteoarthritis   . Osteoporosis   . Pulmonary emboli ([place])   . Restless legs     PAST SURGICAL HISTORY: Past Surgical History:  Procedure Laterality Date  . CARPAL TUNNEL RELEASE    . ESOPHAGOGASTRODUODENOSCOPY (EGD) WITH PROPOFOL N/A 09/10/2015   Procedure: ESOPHAGOGASTRODUODENOSCOPY (EGD) WITH PROPOFOL;  Surgeon: Lollie Sails, MD;  Location: [facility] ENDOSCOPY;  Service: Endoscopy;  Laterality: N/A;  . LUMBAR [place]    . SPLENECTOMY, PARTIAL    . TOTAL ABDOMINAL HYSTERECTOMY      FAMILY HISTORY: Family History  Problem Relation Age of Onset  . Stroke Mother     ADVANCED DIRECTIVES (Y/N):  @ADVDIR @  HEALTH MAINTENANCE: Social History  Substance Use Topics  . Smoking status: Never Smoker  . Smokeless tobacco: Never Used  . Alcohol use No     Colonoscopy:  PAP:  Bone density:  Lipid panel:  Allergies  Allergen Reactions  . Aspirin     Other reaction(s): Distress (finding)  . Diazepam     Other reaction(s): Itching of Skin sneezing  . Morphine     Other reaction(s): Itching of Skin  . Other     Other reaction(s): Unknown seasonal allergies  . Tetanus Toxoids     Other reaction(s): Localized superficial swelling of skin    Current Facility-Administered Medications  Medication Dose Route Frequency Provider Last Rate Last Dose  . 0.9 %  sodium chloride infusion  250 mL Intravenous PRN Dustin Flock, MD      . acetaminophen (TYLENOL) tablet 650 mg  650 mg Oral Q6H PRN Dustin Flock, MD      . albuterol (PROVENTIL) (2.5 MG/3ML)  0.083% nebulizer solution 2.5 mg  2.5 mg Inhalation Q6H PRN Dustin Flock, MD      . ALPRAZolam Duanne Moron) tablet 0.25 mg  0.25 mg Oral QHS PRN Dustin Flock, MD   0.25 mg at 02/18/17 2058  . buPROPion (WELLBUTRIN XL) 24 hr tablet 150 mg  150 mg Oral Daily Dustin Flock, MD   150 mg at 02/19/17 0948  . busPIRone (BUSPAR) tablet 7.5 mg  7.5 mg Oral BID Dustin Flock, MD   7.5 mg at 02/19/17  0948  . calcium-vitamin D (OSCAL WITH D) 500-200 MG-UNIT per tablet 1 tablet  1 tablet Oral Daily Dustin Flock, MD   1 tablet at 02/19/17 0946  . chlorpheniramine-HYDROcodone (TUSSIONEX) 10-8 MG/5ML suspension 5 mL  5 mL Oral BID Dustin Flock, MD   5 mL at 02/19/17 0945  . citalopram (CELEXA) tablet 40 mg  40 mg Oral Daily Dustin Flock, MD   40 mg at 02/19/17 0947  . docusate sodium (COLACE) capsule 200 mg  200 mg Oral BID Dustin Flock, MD   200 mg at 02/19/17 0946  . donepezil (ARICEPT) tablet 5 mg  5 mg Oral Daily Dustin Flock, MD   5 mg at 02/19/17 0946  . gabapentin (NEURONTIN) capsule 300 mg  300 mg Oral TID Dustin Flock, MD   300 mg at 02/19/17 0946  . HYDROcodone-acetaminophen (NORCO) 7.5-325 MG per tablet 1 tablet  1 tablet Oral Q6H PRN Dustin Flock, MD   1 tablet at 02/19/17 0222  . levothyroxine (SYNTHROID, LEVOTHROID) tablet 50 mcg  50 mcg Oral QAC breakfast Dustin Flock, MD   50 mcg at 02/19/17 0948  . memantine (NAMENDA) tablet 5 mg  5 mg Oral BID Dustin Flock, MD   5 mg at 02/19/17 0946  . mometasone-formoterol (DULERA) 200-5 MCG/ACT inhaler 2 puff  2 puff Inhalation BID Dustin Flock, MD   2 puff at 02/19/17 0946  . multivitamin-lutein (OCUVITE-LUTEIN) capsule 1 capsule  1 capsule Oral Daily Dustin Flock, MD   1 capsule at 02/19/17 0947  . ondansetron (ZOFRAN) tablet 4 mg  4 mg Oral Q6H PRN Dustin Flock, MD       Or  . ondansetron [facility]) injection 4 mg  4 mg Intravenous Q6H PRN Dustin Flock, MD      . oxybutynin (DITROPAN) tablet 5 mg  5 mg Oral BID Dustin Flock, MD   5 mg at 02/19/17 0947  . pantoprazole (PROTONIX) EC tablet 40 mg  40 mg Oral BID Dustin Flock, MD   40 mg at 02/19/17 0946  . polyethylene glycol (MIRALAX / GLYCOLAX) packet 17 g  17 g Oral Daily Dustin Flock, MD   17 g at 02/19/17 0945  . pravastatin (PRAVACHOL) tablet 20 mg  20 mg Oral Daily Dustin Flock, MD   20 mg at 02/19/17 0947  . senna (SENOKOT) tablet 17.2 mg   2 tablet Oral Daily Dustin Flock, MD   17.2 mg at 02/19/17 0951  . sodium chloride flush (NS) 0.9 % injection 3 mL  3 mL Intravenous Q12H Dustin Flock, MD   3 mL at 02/19/17 0948  . sodium chloride flush (NS) 0.9 % injection 3 mL  3 mL Intravenous PRN Dustin Flock, MD      . vitamin B-12 (CYANOCOBALAMIN) tablet 1,000 mcg  1,000 mcg Oral Daily Dustin Flock, MD   1,000 mcg at 02/19/17 0946  . vitamin C (ASCORBIC ACID) tablet 500 mg  500 mg Oral Daily Dustin Flock, MD   500 mg at  02/19/17 0946  . vitamin E capsule 400 Units  400 Units Oral Daily Dustin Flock, MD   400 Units at 02/19/17 [phone]  . zolpidem (AMBIEN) tablet 5 mg  5 mg Oral QHS PRN Dustin Flock, MD        OBJECTIVE: Vitals:   02/19/17 0416 02/19/17 1341  BP: 104/64 (!) 102/49  Pulse: 64 60  Resp: (!) 22 18  Temp: 98.4 F (36.9 C) 98.3 F (36.8 C)     Body mass index is 23.66 kg/m.    ECOG FS:1 - Symptomatic but completely ambulatory  General: Well-developed, well-nourished, no acute distress. Eyes: Pink conjunctiva, anicteric sclera. HEENT: Normocephalic, moist mucous membranes, clear oropharnyx. Lungs: Clear to auscultation bilaterally. Heart: Regular rate and rhythm. No rubs, murmurs, or gallops. Abdomen: Soft, nontender, nondistended. No organomegaly noted, normoactive bowel sounds. Musculoskeletal: No edema, cyanosis, or clubbing. Neuro: Alert, answering all questions appropriately. Cranial nerves grossly intact. Skin: No rashes or petechiae noted. Bruising noted on extremities. Psych: Normal affect. Lymphatics: No cervical, calvicular, axillary or inguinal LAD.   LAB RESULTS:  Lab Results  Component Value Date   NA 138 02/19/2017   K 3.8 02/19/2017   CL 105 02/19/2017   CO2 29 02/19/2017   GLUCOSE 82 02/19/2017   BUN 20 02/19/2017   CREATININE 0.97 02/19/2017   CALCIUM 8.5 (L) 02/19/2017   PROT 6.2 (L) 02/18/2017   ALBUMIN 3.7 02/18/2017   AST 25 02/18/2017   ALT 14 02/18/2017    ALKPHOS 60 02/18/2017   BILITOT 0.8 02/18/2017   GFRNONAA 53 (L) 02/19/2017   GFRAA >60 02/19/2017    Lab Results  Component Value Date   WBC 14.4 (H) 02/19/2017   NEUTROABS 19.5 (H) 02/18/2017   HGB 12.8 02/19/2017   HCT 39.2 02/19/2017   MCV 90.4 02/19/2017   PLT 10 (LL) 02/19/2017     STUDIES: Ct Abdomen Pelvis W Contrast  Result Date: 02/18/2017 CLINICAL DATA:  Severe lower abdominal pain EXAM: CT ABDOMEN AND PELVIS WITH CONTRAST TECHNIQUE: Multidetector CT imaging of the abdomen and pelvis was performed using the standard protocol following bolus administration of intravenous contrast. CONTRAST:  156mL ISOVUE-300 IOPAMIDOL (ISOVUE-300) INJECTION 61% COMPARISON:  None. FINDINGS: Lower chest: Mild cardiomegaly.  No acute abnormalities. Hepatobiliary: No focal hepatic abnormality. Gallbladder unremarkable. Pancreas: Fatty replacement. No focal abnormality or ductal dilatation. Spleen: Prior splenectomy. Adrenals/Urinary Tract: No adrenal abnormality. No focal renal abnormality. No stones or hydronephrosis. Urinary bladder is unremarkable. Stomach/Bowel: Large stool burden throughout the colon, particularly the left colon. Sigmoid diverticulosis. No active diverticulitis. Stomach and small bowel decompressed. Vascular/Lymphatic: No evidence of aneurysm or adenopathy. Reproductive: Uterus and adnexa unremarkable.  No mass. Other: No free fluid or free air. Musculoskeletal: Postoperative changes in the lower lumbar spine. No acute bony abnormality. IMPRESSION: Large stool burden throughout the colon, particularly the left colon. Sigmoid diverticulosis. Prior splenectomy. Fatty replacement of the pancreas. Mild cardiomegaly. Electronically Signed   By: Rolm Baptise M.D.   On: 02/18/2017 12:08   Dg Abd Acute W/chest  Result Date: 02/18/2017 CLINICAL DATA:  Shortness of Breath EXAM: DG ABDOMEN ACUTE W/ 1V CHEST COMPARISON:  None. FINDINGS: Lungs are clear bilaterally. Cardiac shadow is within  normal limits. No acute bony abnormality is noted. Degenerative changes about the right shoulder joint are seen. Scattered large and small bowel gas is noted. Fecal material is noted throughout colon consistent with a mild degree of constipation. Postsurgical changes of the lumbosacral junction are seen. No free air is noted.  No acute bony abnormality is noted. IMPRESSION: No acute abnormality seen. Mild constipation. Electronically Signed   By: Inez Catalina M.D.   On: 02/18/2017 11:49    ASSESSMENT: Severe thrombocytopenia, abdominal pain.  PLAN:    1. Chronic refractory ITP: Patient had a poor response to Prednisone, WinRho, and Rituxan. She is status post splenectomy in 2007. Her platelet count has significantly improved after her most recent injection of Nplate. Since patient missed her most recent injection secondary to insurance denial earlier this week, will proceed with 4 mcg/kg of Nplate today prior to discharge. Return to the [facility] as previously scheduled for further evaluation and treatment.  2. Abdominal pain: CT scan results reviewed independently. Presumed to be secondary to constipation which has improved.  Appreciate consult, call with questions.   Lloyd Huger, MD   02/19/2017 3:43 PM

## 2017-02-20 NOTE — Progress Notes (Signed)
MD order received to discharge pt home today in [facility]; verbally reviewed AVS with pt and pt's spouse, [place] Sarinana, no questions voiced; pt discharged via wheelchair by nursing to the visitor's entrance

## 2017-02-20 NOTE — Discharge Instructions (Signed)
Resume diet and activity as before ° ° °

## 2017-02-22 ENCOUNTER — Ambulatory Visit: Payer: Medicare HMO

## 2017-02-24 ENCOUNTER — Ambulatory Visit: Payer: Medicare HMO | Admitting: Physical Therapy

## 2017-02-25 ENCOUNTER — Inpatient Hospital Stay: Payer: Medicare HMO

## 2017-02-28 NOTE — Progress Notes (Signed)
[place]  [phone]  [phone]) [phone]     Ariana [name] DOB: 1933/02/19  MR#: [id number]  HBZ#:[id number]  Patient Care Team: Idelle Crouch, MD as PCP - General (Internal Medicine)  CHIEF COMPLAINT: Chronic refractory ITP  INTERVAL HISTORY:  Patient returns to clinic today for repeat laboratory work and consideration of additional Nplate. She currently feels well and is asymptomatic.  She was recently in the hospital for abdominal pain, but no distinct etiology could be determined. CT scan was negative. Thought possibly secondary to severe constipation. She does not complain of easy bruising. She has no neurologic complaints. She denies any fevers. She has no chest pain or shortness of breath. She denies any nausea, vomiting, constipation, or diarrhea. She has no urinary complaints. Patient offers no specific complaints today.  REVIEW OF SYSTEMS:   Review of Systems  Constitutional: Negative for fever, malaise/fatigue and weight loss.  HENT: Negative for congestion.   Respiratory: Negative.  Negative for shortness of breath.   Cardiovascular: Negative.  Negative for chest pain and leg swelling.  Gastrointestinal: Negative for abdominal pain, blood in stool, diarrhea, melena and nausea.  Genitourinary: Negative for dysuria and hematuria.  Musculoskeletal: Negative.   Skin: Negative.  Negative for rash.  Neurological: Negative.  Negative for sensory change and weakness.  Endo/Heme/Allergies: Does not bruise/bleed easily.  Psychiatric/Behavioral: Negative.  The patient is not nervous/anxious.     As per HPI. Otherwise, a complete review of systems is negative.  PAST MEDICAL HISTORY: Past Medical History:  Diagnosis Date  . Anemia   . Anxiety   . Asthma   . Chronic back pain   . Depression   . Depression   . GERD (gastroesophageal reflux disease)   . Hiatal hernia   . Hypercholesteremia   . ITP (idiopathic thrombocytopenic  purpura)   . Osteoarthritis   . Osteoporosis   . Pulmonary emboli ([place])   . Restless legs     PAST SURGICAL HISTORY: Past Surgical History:  Procedure Laterality Date  . CARPAL TUNNEL RELEASE    . ESOPHAGOGASTRODUODENOSCOPY (EGD) WITH PROPOFOL N/A 09/10/2015   Procedure: ESOPHAGOGASTRODUODENOSCOPY (EGD) WITH PROPOFOL;  Surgeon: Lollie Sails, MD;  Location: [facility] ENDOSCOPY;  Service: Endoscopy;  Laterality: N/A;  . LUMBAR [place]    . SPLENECTOMY, PARTIAL    . TOTAL ABDOMINAL HYSTERECTOMY      FAMILY HISTORY Family History  Problem Relation Age of Onset  . Stroke Mother     GYNECOLOGIC HISTORY:  No LMP recorded. Patient is postmenopausal.     ADVANCED DIRECTIVES:    HEALTH MAINTENANCE: Social History  Substance Use Topics  . Smoking status: Never Smoker  . Smokeless tobacco: Never Used  . Alcohol use No     Allergies  Allergen Reactions  . Aspirin     Other reaction(s): Distress (finding)  . Diazepam     Other reaction(s): Itching of Skin sneezing  . Morphine     Other reaction(s): Itching of Skin  . Other     Other reaction(s): Unknown seasonal allergies  . Tetanus Toxoids     Other reaction(s): Localized superficial swelling of skin    Current Outpatient Prescriptions  Medication Sig Dispense Refill  . acetaminophen (TYLENOL) 325 MG tablet Take 2 tablets (650 mg total) by mouth every 6 (six) hours as needed for mild pain (or Fever >/= 101). 100 tablet 0  . albuterol (PROVENTIL HFA;VENTOLIN HFA) 108 (90 BASE) MCG/ACT inhaler Inhale 2 puffs into the lungs  every 6 (six) hours as needed for wheezing or shortness of breath.    . ALPRAZolam (XANAX) 0.25 MG tablet TAKE 1 TABLET TWICE DAILY AS NEEDED FOR SLEEP    . BUDESONIDE-FORMOTEROL FUMARATE IN Inhale 2 puffs into the lungs 2 (two) times daily as needed (for shortness of breath and wheezing.).     Marland Kitchen buPROPion (WELLBUTRIN XL) 150 MG 24 hr tablet Take 1 tablet by mouth daily.    . busPIRone (BUSPAR)  15 MG tablet Take 7.5 mg by mouth 2 (two) times daily.    . Calcium Carbonate-Vitamin D (CALCIUM 600+D) 600-400 MG-UNIT per tablet Take 1 tablet by mouth daily.    . chlorpheniramine-HYDROcodone (TUSSIONEX PENNKINETIC ER) 10-8 MG/5ML SUER Take 5 mLs by mouth 2 (two) times daily. 140 mL 0  . citalopram (CELEXA) 40 MG tablet Take 40 mg by mouth daily.    . Cyanocobalamin (RA VITAMIN B-12 TR) 1000 MCG TBCR Take 1,000 mcg by mouth daily.     Marland Kitchen donepezil (ARICEPT) 5 MG tablet Take 5 mg by mouth daily.    . Fluticasone-Salmeterol (ADVAIR) 250-50 MCG/DOSE AEPB Inhale 1 puff into the lungs 2 (two) times daily.    Marland Kitchen gabapentin (NEURONTIN) 300 MG capsule 300mg  orally 3 times a day    . HYDROcodone-acetaminophen (NORCO) 7.5-325 MG tablet 1 tablet by mouth 3 times daily    . levothyroxine (SYNTHROID, LEVOTHROID) 50 MCG tablet Take 50 mcg by mouth daily before breakfast. Take 30 to 60 minutes before breakfast.    . LORazepam (ATIVAN) 0.5 MG tablet Take 1 tablet (0.5 mg total) by mouth every 8 (eight) hours as needed for anxiety. 30 tablet 0  . Multiple Vitamins-Minerals (OCUVITE-LUTEIN PO) Take 1 capsule by mouth daily.     Marland Kitchen oxybutynin (DITROPAN) 5 MG tablet Take 5 mg by mouth 2 (two) times daily.     . pantoprazole (PROTONIX) 20 MG tablet Take 20 mg by mouth 2 (two) times daily.     . pravastatin (PRAVACHOL) 20 MG tablet Take 20 mg by mouth daily.    . traMADol (ULTRAM) 50 MG tablet Take 50 mg by mouth 3 (three) times daily as needed for moderate pain or severe pain. pain    . vitamin C (ASCORBIC ACID) 500 MG tablet Take 500 mg by mouth daily.    . vitamin E (E-400) 400 UNIT capsule Take 400 Units by mouth daily.    Marland Kitchen zolpidem (AMBIEN) 5 MG tablet Take 5 mg by mouth at bedtime as needed for sleep.    . memantine (NAMENDA) 5 MG tablet Take 5 mg by mouth 2 (two) times daily.      No current facility-administered medications for this visit.     OBJECTIVE: BP (!) 149/74   Pulse 65   Temp (!) 96.8 F (36  C) (Tympanic)   Resp 20   Wt 141 lb 11.2 oz (64.3 kg)   BMI 23.58 kg/m    Body mass index is 23.58 kg/m.    ECOG FS:1 - Symptomatic but completely ambulatory  General: Well-developed, well-nourished, no acute distress. Eyes: Pink conjunctiva, anicteric sclera. HEENT: Normocephalic, moist mucous membranes, clear oropharnyx. Lungs: Clear to auscultation bilaterally. Heart: Regular rate and rhythm. No rubs, murmurs, or gallops. Musculoskeletal: No edema, cyanosis, or clubbing. Neuro: Alert, answering all questions appropriately. Cranial nerves grossly intact. Skin: No petechiae noted. Bruising noted in various stages of healing. Psych: Normal affect.    LAB RESULTS:  Appointment on 03/02/2017  Component Date Value Ref Range Status  .  WBC 03/02/2017 16.1* 3.6 - 11.0 K/uL Final  . RBC 03/02/2017 4.48  3.80 - 5.20 MIL/uL Final  . Hemoglobin 03/02/2017 13.0  12.0 - 16.0 g/dL Final  . HCT 03/02/2017 39.9  35.0 - 47.0 % Final  . MCV 03/02/2017 89.2  80.0 - 100.0 fL Final  . MCH 03/02/2017 29.0  26.0 - 34.0 pg Final  . MCHC 03/02/2017 32.5  32.0 - 36.0 g/dL Final  . RDW 03/02/2017 16.1* 11.5 - 14.5 % Final  . Platelets 03/02/2017 315  150 - 400 K/uL Final   RESULT REPEATED AND VERIFIED  . Neutrophils Relative % 03/02/2017 75  % Final  . Neutro Abs 03/02/2017 12.1* 1.4 - 6.5 K/uL Final  . Lymphocytes Relative 03/02/2017 17  % Final  . Lymphs Abs 03/02/2017 2.8  1.0 - 3.6 K/uL Final  . Monocytes Relative 03/02/2017 6  % Final  . Monocytes Absolute 03/02/2017 0.9  0.2 - 0.9 K/uL Final  . Eosinophils Relative 03/02/2017 1  % Final  . Eosinophils Absolute 03/02/2017 0.1  0 - 0.7 K/uL Final  . Basophils Relative 03/02/2017 1  % Final  . Basophils Absolute 03/02/2017 0.2* 0 - 0.1 K/uL Final    STUDIES: No results found.  ASSESSMENT:  Chronic refractory ITP  PLAN:   1. Chronic refractory ITP: Patient had a poor response to Prednisone, WinRho, and Rituxan. She is status post  splenectomy in 2007. Her platelet count has significantly improved after her most recent injection of Nplate on Feb 19, 2594. She does not require 4 mcg/kg of Nplate today. Return to clinic in 2 weeks for laboratory work and consideration of Nplate and then in 1 month for laboratory work, further evaluation, and consideration of Nplate if her platelet count falls below 100. Patient appears to only requiring injections every 3-4 weeks. Can consider Promacta 12.5mg  at a later date if necessary. Max dose of Nplate is 10 mcg/kg. 2. Abdominal pain: Resolved. CT scan did not reveal any distinct etiology. 3. Constipation: Patient does not complain of this today.  Patient expressed understanding and was in agreement with this plan. She also understands that She can call clinic at any time with any questions, concerns, or complaints.    Lloyd Huger, MD   03/08/2017 6:34 PM

## 2017-03-01 ENCOUNTER — Ambulatory Visit: Payer: Medicare HMO

## 2017-03-01 NOTE — Discharge Summary (Signed)
[name] NAME: Ariana [name]    MR#:  [id number]  DATE OF BIRTH:  1933-05-03  DATE OF ADMISSION:  02/18/2017 ADMITTING PHYSICIAN: Ariana Flock, MD  DATE OF DISCHARGE: 02/20/2017  1:20 PM  PRIMARY CARE PHYSICIAN: Ariana Crouch, MD   ADMISSION DIAGNOSIS:  Thrombocytopenia ([place]) [D69.6] Lower abdominal pain [R10.30] Leukocytosis, unspecified type [D72.829]  DISCHARGE DIAGNOSIS:  Active Problems:   Thrombocytopenia ([place])   SECONDARY DIAGNOSIS:   Past Medical History:  Diagnosis Date  . Anemia   . Anxiety   . Asthma   . Chronic back pain   . Depression   . Depression   . GERD (gastroesophageal reflux disease)   . Hiatal hernia   . Hypercholesteremia   . ITP (idiopathic thrombocytopenic purpura)   . Osteoarthritis   . Osteoporosis   . Pulmonary emboli ([place])   . Restless legs      ADMITTING HISTORY  HISTORY OF PRESENT ILLNESS: Ariana [name]  is a 83 y.o. female with a known history of  Anemia, anxiety, asthma, chronic back pain, depression, ITP, hyperlipidemia and GERD who follows Ariana [name] recently had blood work yesterday which showed platelets less than 10. Patient comes to the ER with complaint of having abdominal pain in the lower abdomen with cramping. She underwent a CT scan which showed large stool burden. However patient's platelet count is severely low. The emergency room physician contacted Ariana [name] who recommends admission due to the fact that patient was not able able to get the medication she needed for her thrombocytopenia because it was not approved through her insurance according to the ER physician. Patient otherwise denies any bleeding no hematemesis hematochezia she actually reports that she's been having some loose stools.  HOSPITAL COURSE:   * ITP with severe thrombocytopenia. Patient was not responsive to steroids in the past. She did receive Nplate in the hospital which she could  not get as outpatient due to insurance issues. No bleeding. Discussed with Ariana [name] who advised discharging patient home and follow-up with him in the office.  * Constipation causing abdominal pain has resolved with laxatives. She had multiple bowel movements in the hospital.  Patient is stable for discharge home.  CONSULTS OBTAINED:  Treatment Team:  Ariana Huger, MD  DRUG ALLERGIES:   Allergies  Allergen Reactions  . Aspirin     Other reaction(s): Distress (finding)  . Diazepam     Other reaction(s): Itching of Skin sneezing  . Morphine     Other reaction(s): Itching of Skin  . Other     Other reaction(s): Unknown seasonal allergies  . Tetanus Toxoids     Other reaction(s): Localized superficial swelling of skin    DISCHARGE MEDICATIONS:   Discharge Medication List as of 02/20/2017  1:12 PM    CONTINUE these medications which have NOT CHANGED   Details  acetaminophen (TYLENOL) 325 MG tablet Take 2 tablets (650 mg total) by mouth every 6 (six) hours as needed for mild pain (or Fever >/= 101)., Starting Tue 07/02/2015, Print    albuterol (PROVENTIL HFA;VENTOLIN HFA) 108 (90 BASE) MCG/ACT inhaler Inhale 2 puffs into the lungs every 6 (six) hours as needed for wheezing or shortness of breath., Historical Med    ALPRAZolam (XANAX) 0.25 MG tablet TAKE 1 TABLET TWICE DAILY AS NEEDED FOR SLEEP, Historical Med    buPROPion (WELLBUTRIN XL) 150 MG 24 hr tablet Take 1 tablet by mouth daily., Starting Tue 07/14/2016, Until  Wed 07/14/2017, Historical Med    busPIRone (BUSPAR) 15 MG tablet Take 7.5 mg by mouth 2 (two) times daily., Historical Med    Calcium Carbonate-Vitamin D (CALCIUM 600+D) 600-400 MG-UNIT per tablet Take 1 tablet by mouth daily., Historical Med    citalopram (CELEXA) 40 MG tablet Take 40 mg by mouth daily., Historical Med    Cyanocobalamin (RA VITAMIN B-12 TR) 1000 MCG TBCR Take 1,000 mcg by mouth daily. , Historical Med    donepezil (ARICEPT) 5 MG  tablet Take 5 mg by mouth daily., Starting Thu 10/18/2014, Historical Med    Fluticasone-Salmeterol (ADVAIR) 250-50 MCG/DOSE AEPB Inhale 1 puff into the lungs 2 (two) times daily., Historical Med    gabapentin (NEURONTIN) 300 MG capsule 300mg  orally 3 times a day, Historical Med    HYDROcodone-acetaminophen (NORCO) 7.5-325 MG tablet 1 tablet by mouth 3 times daily, Historical Med    levothyroxine (SYNTHROID, LEVOTHROID) 50 MCG tablet Take 50 mcg by mouth daily before breakfast. Take 30 to 60 minutes before breakfast., Historical Med    memantine (NAMENDA) 5 MG tablet Take 5 mg by mouth 2 (two) times daily. , Starting Mon 08/19/2015, Until Thu 02/18/2017, Historical Med    Multiple Vitamins-Minerals (OCUVITE-LUTEIN PO) Take 1 capsule by mouth daily. , Historical Med    oxybutynin (DITROPAN) 5 MG tablet Take 5 mg by mouth 2 (two) times daily. , Starting Mon 08/10/2016, Until Tue 08/10/2017, Historical Med    pantoprazole (PROTONIX) 20 MG tablet Take 20 mg by mouth 2 (two) times daily. , Starting Wed 07/24/2015, Historical Med    pravastatin (PRAVACHOL) 20 MG tablet Take 20 mg by mouth daily., Historical Med    traMADol (ULTRAM) 50 MG tablet Take 50 mg by mouth 3 (three) times daily as needed for moderate pain or severe pain. pain, Historical Med    vitamin C (ASCORBIC ACID) 500 MG tablet Take 500 mg by mouth daily., Historical Med    vitamin E (E-400) 400 UNIT capsule Take 400 Units by mouth daily., Historical Med    BUDESONIDE-FORMOTEROL FUMARATE IN Inhale 2 puffs into the lungs 2 (two) times daily as needed (for shortness of breath and wheezing.). , Historical Med    chlorpheniramine-HYDROcodone (TUSSIONEX PENNKINETIC ER) 10-8 MG/5ML SUER Take 5 mLs by mouth 2 (two) times daily., Starting Sun 11/22/2016, Print    LORazepam (ATIVAN) 0.5 MG tablet Take 1 tablet (0.5 mg total) by mouth every 8 (eight) hours as needed for anxiety., Starting Sun 11/22/2016, Until Mon 11/22/2017, Print    zolpidem  (AMBIEN) 5 MG tablet Take 5 mg by mouth at bedtime as needed for sleep., Historical Med        Today   VITAL SIGNS:  Blood pressure (!) 123/49, pulse (!) 59, temperature 98.5 F (36.9 C), temperature source Oral, resp. rate 16, height 5\' 5"  (1.651 m), weight 64.5 kg (142 lb 3.2 oz), SpO2 97 %.  I/O:  No intake or output data in the 24 hours ending 03/01/17 1343  PHYSICAL EXAMINATION:  Physical Exam  GENERAL:  83 y.o.-year-old patient lying in the bed with no acute distress.  LUNGS: Normal breath sounds bilaterally, no wheezing, rales,rhonchi or crepitation. No use of accessory muscles of respiration.  CARDIOVASCULAR: S1, S2 normal. No murmurs, rubs, or gallops.  ABDOMEN: Soft, non-tender, non-distended. Bowel sounds present. No organomegaly or mass.  NEUROLOGIC: Moves all 4 extremities. PSYCHIATRIC: The patient is alert and oriented x 3.  SKIN: No obvious rash, lesion, or ulcer.   DATA REVIEW:  CBC No results for input(s): WBC, HGB, HCT, PLT in the last 168 hours.  Chemistries  No results for input(s): NA, K, CL, CO2, GLUCOSE, BUN, CREATININE, CALCIUM, MG, AST, ALT, ALKPHOS, BILITOT in the last 168 hours.  Invalid input(s): GFRCGP  Cardiac Enzymes No results for input(s): TROPONINI in the last 168 hours.  Microbiology Results  Results for orders placed or performed in visit on 05/14/16  Urine culture     Status: Abnormal   Collection Time: 05/14/16 11:05 AM  Result Value Ref Range Status   Specimen Description URINE, CLEAN CATCH  Final   Special Requests NONE  Final   Culture (A)  Final    80,000 COLONIES/mL PROTEUS MIRABILIS 20,000 COLONIES/mL ESCHERICHIA COLI    Report Status 05/17/2016 FINAL  Final   Organism ID, Bacteria PROTEUS MIRABILIS (A)  Final   Organism ID, Bacteria ESCHERICHIA COLI (A)  Final      Susceptibility   Escherichia coli - MIC*    AMPICILLIN >=32 RESISTANT Resistant     CEFAZOLIN <=4 SENSITIVE Sensitive     CEFTRIAXONE <=1 SENSITIVE  Sensitive     CIPROFLOXACIN <=0.25 SENSITIVE Sensitive     GENTAMICIN <=1 SENSITIVE Sensitive     IMIPENEM <=0.25 SENSITIVE Sensitive     NITROFURANTOIN <=16 SENSITIVE Sensitive     TRIMETH/SULFA >=320 RESISTANT Resistant     AMPICILLIN/SULBACTAM 16 INTERMEDIATE Intermediate     PIP/TAZO <=4 SENSITIVE Sensitive     Extended ESBL NEGATIVE Sensitive     * 20,000 COLONIES/mL ESCHERICHIA COLI   Proteus mirabilis - MIC*    AMPICILLIN <=2 SENSITIVE Sensitive     CEFAZOLIN <=4 SENSITIVE Sensitive     CEFTRIAXONE <=1 SENSITIVE Sensitive     CIPROFLOXACIN <=0.25 SENSITIVE Sensitive     GENTAMICIN <=1 SENSITIVE Sensitive     IMIPENEM 2 SENSITIVE Sensitive     NITROFURANTOIN 128 RESISTANT Resistant     TRIMETH/SULFA <=20 SENSITIVE Sensitive     AMPICILLIN/SULBACTAM <=2 SENSITIVE Sensitive     PIP/TAZO <=4 SENSITIVE Sensitive     * 80,000 COLONIES/mL PROTEUS MIRABILIS    RADIOLOGY:  No results found.  Follow up with PCP in 1 week.  Management plans discussed with the patient, family and they are in agreement.  CODE STATUS:  Code Status History    Date Active Date Inactive Code Status Order ID Comments User Context   02/18/2017  4:50 PM 02/20/2017  4:57 PM Full Code [id number]  Ariana Flock, MD Inpatient   06/29/2015 10:12 PM 07/02/2015  6:17 PM Full Code [id number]  Gladstone Lighter, MD Inpatient      TOTAL TIME TAKING CARE OF THIS PATIENT ON DAY OF DISCHARGE: more than 30 minutes.   Hillary Bow R M.D on 03/01/2017 at 1:43 PM  Between 7am to 6pm - Pager - [phone]  After 6pm go to [email or web address] - password [email or web address] [facility]  Office  [phone]  CC: Primary care physician; Ariana Crouch, MD  Note: This dictation was prepared with Dragon dictation along with smaller phrase technology. Any transcriptional errors that result from this process are unintentional.

## 2017-03-02 ENCOUNTER — Inpatient Hospital Stay (HOSPITAL_BASED_OUTPATIENT_CLINIC_OR_DEPARTMENT_OTHER): Payer: Medicare HMO | Admitting: Oncology

## 2017-03-02 ENCOUNTER — Inpatient Hospital Stay: Payer: Medicare HMO

## 2017-03-02 VITALS — BP 149/74 | HR 65 | Temp 96.8°F | Resp 20 | Wt 141.7 lb

## 2017-03-02 DIAGNOSIS — F419 Anxiety disorder, unspecified: Secondary | ICD-10-CM | POA: Diagnosis not present

## 2017-03-02 DIAGNOSIS — Z780 Asymptomatic menopausal state: Secondary | ICD-10-CM

## 2017-03-02 DIAGNOSIS — E7800 Pure hypercholesterolemia, unspecified: Secondary | ICD-10-CM | POA: Diagnosis not present

## 2017-03-02 DIAGNOSIS — F418 Other specified anxiety disorders: Secondary | ICD-10-CM | POA: Diagnosis not present

## 2017-03-02 DIAGNOSIS — K219 Gastro-esophageal reflux disease without esophagitis: Secondary | ICD-10-CM | POA: Diagnosis not present

## 2017-03-02 DIAGNOSIS — Z79899 Other long term (current) drug therapy: Secondary | ICD-10-CM

## 2017-03-02 DIAGNOSIS — Z86711 Personal history of pulmonary embolism: Secondary | ICD-10-CM | POA: Diagnosis not present

## 2017-03-02 DIAGNOSIS — D473 Essential (hemorrhagic) thrombocythemia: Secondary | ICD-10-CM | POA: Diagnosis not present

## 2017-03-02 DIAGNOSIS — D693 Immune thrombocytopenic purpura: Secondary | ICD-10-CM

## 2017-03-02 DIAGNOSIS — G8929 Other chronic pain: Secondary | ICD-10-CM | POA: Diagnosis not present

## 2017-03-02 DIAGNOSIS — M549 Dorsalgia, unspecified: Secondary | ICD-10-CM

## 2017-03-02 DIAGNOSIS — K5909 Other constipation: Secondary | ICD-10-CM | POA: Diagnosis not present

## 2017-03-02 LAB — CBC WITH DIFFERENTIAL/PLATELET
Basophils Absolute: 0.2 10*3/uL — ABNORMAL HIGH (ref 0–0.1)
Basophils Relative: 1 %
EOS PCT: 1 %
Eosinophils Absolute: 0.1 10*3/uL (ref 0–0.7)
HCT: 39.9 % (ref 35.0–47.0)
Hemoglobin: 13 g/dL (ref 12.0–16.0)
LYMPHS ABS: 2.8 10*3/uL (ref 1.0–3.6)
LYMPHS PCT: 17 %
MCH: 29 pg (ref 26.0–34.0)
MCHC: 32.5 g/dL (ref 32.0–36.0)
MCV: 89.2 fL (ref 80.0–100.0)
MONO ABS: 0.9 10*3/uL (ref 0.2–0.9)
MONOS PCT: 6 %
Neutro Abs: 12.1 10*3/uL — ABNORMAL HIGH (ref 1.4–6.5)
Neutrophils Relative %: 75 %
PLATELETS: 315 10*3/uL (ref 150–400)
RBC: 4.48 MIL/uL (ref 3.80–5.20)
RDW: 16.1 % — AB (ref 11.5–14.5)
WBC: 16.1 10*3/uL — ABNORMAL HIGH (ref 3.6–11.0)

## 2017-03-02 NOTE — Progress Notes (Signed)
Patient denies any concerns today.  

## 2017-03-03 ENCOUNTER — Ambulatory Visit: Payer: Medicare HMO

## 2017-03-10 ENCOUNTER — Other Ambulatory Visit: Payer: Medicare HMO

## 2017-03-10 ENCOUNTER — Ambulatory Visit: Payer: Medicare HMO | Admitting: Oncology

## 2017-03-10 ENCOUNTER — Ambulatory Visit: Payer: Medicare HMO

## 2017-03-11 ENCOUNTER — Ambulatory Visit: Payer: Medicare HMO

## 2017-03-15 DIAGNOSIS — M5136 Other intervertebral disc degeneration, lumbar region: Secondary | ICD-10-CM | POA: Diagnosis not present

## 2017-03-15 DIAGNOSIS — M62830 Muscle spasm of back: Secondary | ICD-10-CM | POA: Diagnosis not present

## 2017-03-15 DIAGNOSIS — D696 Thrombocytopenia, unspecified: Secondary | ICD-10-CM | POA: Diagnosis not present

## 2017-03-15 DIAGNOSIS — M5416 Radiculopathy, lumbar region: Secondary | ICD-10-CM | POA: Diagnosis not present

## 2017-03-16 ENCOUNTER — Inpatient Hospital Stay: Payer: Medicare HMO

## 2017-03-16 ENCOUNTER — Inpatient Hospital Stay: Payer: Medicare HMO | Attending: Oncology

## 2017-03-16 DIAGNOSIS — K219 Gastro-esophageal reflux disease without esophagitis: Secondary | ICD-10-CM | POA: Insufficient documentation

## 2017-03-16 DIAGNOSIS — F419 Anxiety disorder, unspecified: Secondary | ICD-10-CM | POA: Diagnosis not present

## 2017-03-16 DIAGNOSIS — E7800 Pure hypercholesterolemia, unspecified: Secondary | ICD-10-CM | POA: Diagnosis not present

## 2017-03-16 DIAGNOSIS — D693 Immune thrombocytopenic purpura: Secondary | ICD-10-CM | POA: Insufficient documentation

## 2017-03-16 DIAGNOSIS — M1990 Unspecified osteoarthritis, unspecified site: Secondary | ICD-10-CM | POA: Diagnosis not present

## 2017-03-16 DIAGNOSIS — Z79899 Other long term (current) drug therapy: Secondary | ICD-10-CM | POA: Insufficient documentation

## 2017-03-16 DIAGNOSIS — F329 Major depressive disorder, single episode, unspecified: Secondary | ICD-10-CM | POA: Diagnosis not present

## 2017-03-16 DIAGNOSIS — K5900 Constipation, unspecified: Secondary | ICD-10-CM | POA: Insufficient documentation

## 2017-03-16 DIAGNOSIS — G2581 Restless legs syndrome: Secondary | ICD-10-CM | POA: Diagnosis not present

## 2017-03-16 LAB — CBC WITH DIFFERENTIAL/PLATELET
Basophils Absolute: 0.1 10*3/uL (ref 0–0.1)
Basophils Relative: 1 %
EOS ABS: 0.2 10*3/uL (ref 0–0.7)
EOS PCT: 1 %
HCT: 38.2 % (ref 35.0–47.0)
Hemoglobin: 12.7 g/dL (ref 12.0–16.0)
LYMPHS ABS: 3.6 10*3/uL (ref 1.0–3.6)
LYMPHS PCT: 23 %
MCH: 29.4 pg (ref 26.0–34.0)
MCHC: 33.2 g/dL (ref 32.0–36.0)
MCV: 88.6 fL (ref 80.0–100.0)
MONOS PCT: 8 %
Monocytes Absolute: 1.2 10*3/uL — ABNORMAL HIGH (ref 0.2–0.9)
Neutro Abs: 10.3 10*3/uL — ABNORMAL HIGH (ref 1.4–6.5)
Neutrophils Relative %: 67 %
PLATELETS: 13 10*3/uL — AB (ref 150–400)
RBC: 4.31 MIL/uL (ref 3.80–5.20)
RDW: 16.1 % — ABNORMAL HIGH (ref 11.5–14.5)
WBC: 15.5 10*3/uL — AB (ref 3.6–11.0)

## 2017-03-16 MED ORDER — ROMIPLOSTIM 250 MCG ~~LOC~~ SOLR
250.0000 ug | Freq: Once | SUBCUTANEOUS | Status: AC
  Administered 2017-03-16: 250 ug via SUBCUTANEOUS
  Filled 2017-03-16 (×2): qty 0.5

## 2017-03-22 ENCOUNTER — Telehealth: Payer: Self-pay | Admitting: *Deleted

## 2017-03-22 NOTE — Telephone Encounter (Signed)
  Is patient aware that this could be life threatening?  I am concerned.  M

## 2017-03-22 NOTE — Telephone Encounter (Signed)
I do not see that she went to ER after her fall, Please advise  CBC with Differential  Order: [id number]  Status:  Final result Visible to patient:  No (Not Released) Next appt:  03/30/2017 at 11:00 AM in Oncology ([facility]) Dx:  Chronic ITP (idiopathic thrombocytope...    Ref Range & Units 6d ago (03/16/17) 2wk ago (03/02/17) 61mo ago (02/19/17) 94mo ago (02/18/17) 61mo ago (02/17/17) 65mo ago (02/03/17) 69mo ago (01/20/17)   Platelets 150 - 400 K/uL 13   315CM  10R, CM   PLATELET CLU...R, CM  10R, CM   311R  211

## 2017-03-22 NOTE — Telephone Encounter (Signed)
I called and spoke with son for a fu to our earlier conversation and to see if she had gone to ER and he said she refused to go. She will try to see Dr Doy Hutching or go to a walk in clinic to [facility] , I advised if she is not able to do either of those to call and we will have her come in for lab check here. He thanked me for checking on her

## 2017-03-22 NOTE — Telephone Encounter (Signed)
-----   Message from Ariana [name] sent at 03/22/2017  9:24 AM EDT ----- Contact: [phone] Son called and she fell over the weekend and he states shes black & blue.  Could you call him back?  He thinks she should be seen before 03-30-17-thx

## 2017-03-22 NOTE — Telephone Encounter (Signed)
Per Dr Mike Gip if she hit her, she will need a head scan, She at least needs a CBC check.  After speaking to [place], he reports that she fell outside and has a knot on her head and also had some confusion of not knowing where she was for a brief amount of time. Reports that her lower extremities ar swollen and bruised. I advised that he take her to the ER for evaluation given the fact that her platelets were 13 last week and the confusion. He agreed to take her to ER stating her understands the need

## 2017-03-23 ENCOUNTER — Emergency Department: Payer: Medicare HMO

## 2017-03-23 ENCOUNTER — Emergency Department
Admission: EM | Admit: 2017-03-23 | Discharge: 2017-03-23 | Disposition: A | Discharge status: Home / Self Care | Payer: Medicare HMO | Attending: Emergency Medicine | Admitting: Emergency Medicine

## 2017-03-23 ENCOUNTER — Encounter: Payer: Self-pay | Admitting: Emergency Medicine

## 2017-03-23 ENCOUNTER — Telehealth: Payer: Self-pay | Admitting: *Deleted

## 2017-03-23 DIAGNOSIS — Z7902 Long term (current) use of antithrombotics/antiplatelets: Secondary | ICD-10-CM | POA: Diagnosis not present

## 2017-03-23 DIAGNOSIS — D693 Immune thrombocytopenic purpura: Secondary | ICD-10-CM

## 2017-03-23 DIAGNOSIS — Z79899 Other long term (current) drug therapy: Secondary | ICD-10-CM | POA: Insufficient documentation

## 2017-03-23 DIAGNOSIS — Y92002 Bathroom of unspecified non-institutional (private) residence single-family (private) house as the place of occurrence of the external cause: Secondary | ICD-10-CM | POA: Insufficient documentation

## 2017-03-23 DIAGNOSIS — W19XXXA Unspecified fall, initial encounter: Secondary | ICD-10-CM

## 2017-03-23 DIAGNOSIS — S41111A Laceration without foreign body of right upper arm, initial encounter: Secondary | ICD-10-CM | POA: Diagnosis not present

## 2017-03-23 DIAGNOSIS — I10 Essential (primary) hypertension: Secondary | ICD-10-CM | POA: Diagnosis not present

## 2017-03-23 DIAGNOSIS — W1789XA Other fall from one level to another, initial encounter: Secondary | ICD-10-CM | POA: Insufficient documentation

## 2017-03-23 DIAGNOSIS — S0083XA Contusion of other part of head, initial encounter: Secondary | ICD-10-CM | POA: Diagnosis not present

## 2017-03-23 DIAGNOSIS — J45909 Unspecified asthma, uncomplicated: Secondary | ICD-10-CM | POA: Diagnosis not present

## 2017-03-23 DIAGNOSIS — T07XXXA Unspecified multiple injuries, initial encounter: Secondary | ICD-10-CM

## 2017-03-23 DIAGNOSIS — Y9389 Activity, other specified: Secondary | ICD-10-CM | POA: Diagnosis not present

## 2017-03-23 DIAGNOSIS — Y998 Other external cause status: Secondary | ICD-10-CM | POA: Insufficient documentation

## 2017-03-23 DIAGNOSIS — S40811A Abrasion of right upper arm, initial encounter: Secondary | ICD-10-CM | POA: Diagnosis not present

## 2017-03-23 DIAGNOSIS — S0990XA Unspecified injury of head, initial encounter: Secondary | ICD-10-CM | POA: Diagnosis not present

## 2017-03-23 LAB — BASIC METABOLIC PANEL
Anion gap: 7 (ref 5–15)
BUN: 23 mg/dL — ABNORMAL HIGH (ref 6–20)
CALCIUM: 8.8 mg/dL — AB (ref 8.9–10.3)
CO2: 27 mmol/L (ref 22–32)
CREATININE: 0.89 mg/dL (ref 0.44–1.00)
Chloride: 104 mmol/L (ref 101–111)
GFR, EST NON AFRICAN AMERICAN: 59 mL/min — AB (ref >60)
Glucose, Bld: 87 mg/dL (ref 65–99)
Potassium: 3.8 mmol/L (ref 3.5–5.1)
SODIUM: 138 mmol/L (ref 135–145)

## 2017-03-23 LAB — CBC
HCT: 36.7 % (ref 35.0–47.0)
Hemoglobin: 11.9 g/dL — ABNORMAL LOW (ref 12.0–16.0)
MCH: 29.2 pg (ref 26.0–34.0)
MCHC: 32.5 g/dL (ref 32.0–36.0)
MCV: 89.9 fL (ref 80.0–100.0)
PLATELETS: 44 10*3/uL — AB (ref 150–440)
RBC: 4.08 MIL/uL (ref 3.80–5.20)
RDW: 16.3 % — ABNORMAL HIGH (ref 11.5–14.5)
WBC: 14.3 10*3/uL — ABNORMAL HIGH (ref 3.6–11.0)

## 2017-03-23 LAB — URINALYSIS, COMPLETE (UACMP) WITH MICROSCOPIC
BILIRUBIN URINE: NEGATIVE
Bacteria, UA: NONE SEEN
Glucose, UA: NEGATIVE mg/dL
Hgb urine dipstick: NEGATIVE
Ketones, ur: NEGATIVE mg/dL
LEUKOCYTES UA: NEGATIVE
Nitrite: NEGATIVE
Protein, ur: NEGATIVE mg/dL
Specific Gravity, Urine: 1.016 (ref 1.005–1.030)
pH: 6 (ref 5.0–8.0)

## 2017-03-23 LAB — TROPONIN I: Troponin I: <0.03 ng/mL (ref <0.03)

## 2017-03-23 MED ORDER — SODIUM CHLORIDE 0.9 % IV BOLUS (SEPSIS)
500.0000 mL | Freq: Once | INTRAVENOUS | Status: AC
  Administered 2017-03-23: 500 mL via INTRAVENOUS

## 2017-03-23 NOTE — ED Notes (Addendum)
Pt reports 2 falls this weekend; states she doesn't normally fall. She has cane but rarely uses it. She remembers one fall but not the other. Family member states that she awakened confused.  Pt has numerous bruises and skin tears, as well as generally purple extremities.

## 2017-03-23 NOTE — Telephone Encounter (Signed)
I called and spoke with patient this morning and she reluctantly agreed to go to ER for evaluation today

## 2017-03-23 NOTE — Telephone Encounter (Signed)
We can do lab check later this week.

## 2017-03-23 NOTE — Discharge Instructions (Signed)

## 2017-03-23 NOTE — ED Notes (Signed)
Pt discharged home after verbalizing understanding of discharge instructions; nad noted. 

## 2017-03-23 NOTE — ED Notes (Signed)
Assisted patient to bathroom.  Patient ambulating with steady gait walking with this nurse.  Brief dizziness upon standing to walk back to bed.

## 2017-03-23 NOTE — ED Provider Notes (Signed)
[facility] Emergency Department Provider Note  ____________________________________________  Time seen: Approximately 1:07 PM  I have reviewed the triage vital signs and the nursing notes.   HISTORY  Chief Complaint Fall   HPI Ariana [name] is a 83 y.o. female with a history of ITP on biweekly Nplate injections, Alzheimer's, GERD, hyperlipidemia, anxiety, anemia who presents for evaluation of multiple falls. According to the patient she lives with her husband. She reports she been feeling more fatigued than normal over the weekend. She was working on her garden on Saturday when she lost her balance and fell on her knees. She had a hard time getting up and had to be assisted by her husband. On Sunday she woke up in the middle of the night and she was a little bit confused and was trying to get to the bathroom. She does not remember the fall but she did fall in the bathroom. In the morning when she woke up her husband noted a bruise in her left forehead and bruising to her bilateral arms. Patient denies any pain at this time. She denies headache, chest pain, shortness of breath, pain in her extremities. She denies nausea or vomiting, diarrhea, dysuria. She has been eating and drinking normally. She does have a walker and a cane at home however she refuses to use it at home.  Past Medical History:  Diagnosis Date  . Anemia   . Anxiety   . Asthma   . Chronic back pain   . Depression   . Depression   . GERD (gastroesophageal reflux disease)   . Hiatal hernia   . Hypercholesteremia   . ITP (idiopathic thrombocytopenic purpura)   . Osteoarthritis   . Osteoporosis   . Pulmonary emboli ([place])   . Restless legs     Patient Active Problem List   Diagnosis Date Noted  . Thrombocytopenia ([place]) 02/18/2017  . Asthma without status asthmaticus 11/25/2016  . Late onset Alzheimer's disease without behavioral disturbance 12/30/2015  . Back muscle spasm  09/09/2015  . Clinical depression 04/12/2015  . Herpes zona 04/12/2015  . Chronic ITP (idiopathic thrombocytopenic purpura) (HCC) 03/15/2015  . Chest pain 10/22/2014  . Breathlessness on exertion 10/22/2014  . Osteopenia 10/18/2014  . Benign essential HTN 06/28/2014  . HLD (hyperlipidemia) 06/28/2014  . Idiopathic thrombocythemia ([place]) 06/28/2014  . Acquired hypothyroidism 06/04/2014  . Anxiety and depression 06/04/2014  . Bursitis, trochanteric 03/27/2014  . DDD (degenerative disc disease), lumbar 02/27/2014  . Neuritis or radiculitis due to rupture of lumbar intervertebral disc 02/27/2014    Past Surgical History:  Procedure Laterality Date  . CARPAL TUNNEL RELEASE    . ESOPHAGOGASTRODUODENOSCOPY (EGD) WITH PROPOFOL N/A 09/10/2015   Procedure: ESOPHAGOGASTRODUODENOSCOPY (EGD) WITH PROPOFOL;  Surgeon: Lollie Sails, MD;  Location: [facility] ENDOSCOPY;  Service: Endoscopy;  Laterality: N/A;  . LUMBAR [place]    . SPLENECTOMY, PARTIAL    . TOTAL ABDOMINAL HYSTERECTOMY      Prior to Admission medications   Medication Sig Start Date End Date Taking? Authorizing Provider  acetaminophen (TYLENOL) 325 MG tablet Take 2 tablets (650 mg total) by mouth every 6 (six) hours as needed for mild pain (or Fever >/= 101). 07/02/15  Yes Idelle Crouch, MD  albuterol (PROVENTIL HFA;VENTOLIN HFA) 108 (90 BASE) MCG/ACT inhaler Inhale 2 puffs into the lungs every 6 (six) hours as needed for wheezing or shortness of breath.   Yes [provider]  ALPRAZolam (XANAX) 0.25 MG tablet TAKE 1  TABLET TWICE DAILY AS NEEDED FOR SLEEP 09/11/15  Yes [provider]  buPROPion (WELLBUTRIN XL) 150 MG 24 hr tablet Take 1 tablet by mouth daily. 07/14/16 07/14/17 Yes [provider]  busPIRone (BUSPAR) 7.5 MG tablet Take 7.5 mg by mouth 2 (two) times daily.   Yes [provider]  Calcium Carbonate-Vitamin D (CALCIUM 600+D) 600-400 MG-UNIT per tablet Take 1 tablet by mouth daily.    Yes [provider]  citalopram (CELEXA) 40 MG tablet Take 40 mg by mouth daily.   Yes [provider]  Cyanocobalamin (RA VITAMIN B-12 TR) 1000 MCG TBCR Take 1,000 mcg by mouth daily.    Yes [provider]  donepezil (ARICEPT) 5 MG tablet Take 5 mg by mouth daily. 10/18/14  Yes [provider]  Fluticasone-Salmeterol (ADVAIR) 250-50 MCG/DOSE AEPB Inhale 1 puff into the lungs daily as needed.    Yes [provider]  gabapentin (NEURONTIN) 300 MG capsule 300mg  orally two times a day 07/23/14  Yes [provider]  HYDROcodone-acetaminophen (NORCO) 7.5-325 MG tablet 1 tablet by mouth q 6 h prn 11/14/16  Yes [provider]  levothyroxine (SYNTHROID, LEVOTHROID) 50 MCG tablet Take 50 mcg by mouth daily before breakfast. Take 30 to 60 minutes before breakfast.   Yes [provider]  memantine (NAMENDA) 5 MG tablet Take 5 mg by mouth 2 (two) times daily.  08/19/15 03/23/17 Yes [provider]  Multiple Vitamins-Minerals (HAIR SKIN AND NAILS FORMULA PO) Take 1 tablet by mouth every evening.   Yes [provider]  Multiple Vitamins-Minerals (OCUVITE-LUTEIN PO) Take 1 capsule by mouth daily.    Yes [provider]  oxybutynin (DITROPAN) 5 MG tablet Take 5 mg by mouth 2 (two) times daily.  08/10/16 08/10/17 Yes [provider]  pantoprazole (PROTONIX) 20 MG tablet Take 20 mg by mouth 2 (two) times daily.  07/24/15  Yes [provider]  pravastatin (PRAVACHOL) 20 MG tablet Take 20 mg by mouth daily.   Yes [provider]  traMADol (ULTRAM) 50 MG tablet Take 50 mg by mouth 3 (three) times daily as needed for moderate pain or severe pain. pain   Yes [provider]  vitamin C (ASCORBIC ACID) 500 MG tablet Take 500 mg by mouth daily.   Yes [provider]  vitamin E (E-400) 400 UNIT capsule Take 400 Units by mouth daily.   Yes [provider]  zolpidem (AMBIEN) 5 MG  tablet Take 5 mg by mouth at bedtime.    Yes [provider]  chlorpheniramine-HYDROcodone (TUSSIONEX PENNKINETIC ER) 10-8 MG/5ML SUER Take 5 mLs by mouth 2 (two) times daily. Patient not taking: Reported on 03/23/2017 11/22/16   Earleen Newport, MD  LORazepam (ATIVAN) 0.5 MG tablet Take 1 tablet (0.5 mg total) by mouth every 8 (eight) hours as needed for anxiety. Patient not taking: Reported on 03/23/2017 11/22/16 11/22/17  Earleen Newport, MD    Allergies Aspirin; Diazepam; Morphine; Other; and Tetanus toxoids  Family History  Problem Relation Age of Onset  . Stroke Mother     Social History Social History  Substance Use Topics  . Smoking status: Never Smoker  . Smokeless tobacco: Never Used  . Alcohol use No    Review of Systems Constitutional: Negative for fever. Eyes: Negative for visual changes. ENT: Negative for facial injury or neck injury Cardiovascular: Negative for chest injury. Respiratory: Negative for shortness of breath. Negative for chest wall injury. Gastrointestinal: Negative for abdominal pain  or injury. Genitourinary: Negative for dysuria. Musculoskeletal: Negative for back injury, negative for arm or leg pain. Skin: + multiple abrasions and skin tears.  Neurological: + forehead hematoma  ____________________________________________   PHYSICAL EXAM:  VITAL SIGNS: ED Triage Vitals  Enc Vitals Group     BP 03/23/17 1134 (!) 130/101     Pulse Rate 03/23/17 1134 68     Resp 03/23/17 1134 16     Temp 03/23/17 1134 97 F (36.1 C)     Temp Source 03/23/17 1134 Oral     SpO2 03/23/17 1134 95 %     Weight 03/23/17 1134 141 lb (64 kg)     Height --      Head Circumference --      Peak Flow --      Pain Score 03/23/17 1235 4     Pain Loc --      Pain Edu? --      Excl. in [place]? --    Constitutional: Alert and oriented. No acute distress. Does not appear intoxicated. HEENT Head: Normocephalic and L forehead hematoma Face: No facial  bony tenderness. Stable midface Ears: No hemotympanum bilaterally. No Battle sign Eyes: No eye injury. PERRL. No raccoon eyes Nose: Nontender. No epistaxis. No rhinorrhea Mouth/Throat: Mucous membranes are moist. No oropharyngeal blood. No dental injury. Airway patent without stridor. Normal voice. Neck: no C-collar in place. No midline c-spine tenderness.  Cardiovascular: Normal rate, regular rhythm. Normal and symmetric distal pulses are present in all extremities. Pulmonary/Chest: Chest wall is stable and nontender to palpation/compression. Normal respiratory effort. Breath sounds are normal. No crepitus.  Abdominal: Soft, nontender, non distended. Musculoskeletal: Nontender with normal full range of motion in all extremities. No deformities. No thoracic or lumbar midline spinal tenderness. Pelvis is stable. 2+ pitting edema on b/l extremities Skin: Patient has multiple bruises, hematoma throughout her extremities, also has a skin tear on the right upper extremity . Psychiatric: Speech and behavior are appropriate. Neurological: Normal speech and language. Moves all extremities to command. No gross focal neurologic deficits are appreciated.  [place] Coma Score: 4 - Opens eyes on own 6 - Follows simple motor commands 5 - Alert and oriented GCS: 15   ____________________________________________   LABS (all labs ordered are listed, but only abnormal results are displayed)  Labs Reviewed  BASIC METABOLIC PANEL - Abnormal; Notable for the following:       Result Value   BUN 23 (*)    Calcium 8.8 (*)    GFR calc non Af Amer 59 (*)    All other components within normal limits  CBC - Abnormal; Notable for the following:    WBC 14.3 (*)    Hemoglobin 11.9 (*)    RDW 16.3 (*)    Platelets 44 (*)    All other components within normal limits  URINALYSIS, COMPLETE (UACMP) WITH MICROSCOPIC - Abnormal; Notable for the following:    Color, Urine YELLOW (*)    APPearance CLEAR (*)     Squamous Epithelial / LPF 0-5 (*)    All other components within normal limits  TROPONIN I   ____________________________________________  EKG  ED ECG REPORT I, Rudene Re, the attending physician, personally viewed and interpreted this ECG.  Sinus bradycardia, rate of 57, normal intervals, normal axis, no ST elevations or depressions, T-wave inversion in lead 3. Unchanged from prior from 11/2016  ____________________________________________  RADIOLOGY  Head CT: negative ____________________________________________   PROCEDURES  Procedure(s) performed: None Procedures Critical Care performed:  None ____________________________________________   INITIAL IMPRESSION / ASSESSMENT AND PLAN / ED COURSE  83 y.o. female with a history of ITP on biweekly Nplate injections, Alzheimer's, GERD, hyperlipidemia, anxiety, anemia who presents for evaluation of multiple falls. Patient has multiple bruises and hematomas throughout her 4 extremities but no tenderness to palpation or deformities in any of her joints or bones. Does have a forehead hematoma but no signs or symptoms of basilar skull fracture and a GCS of 15. Due to patient's history of low platelets. Patient for CT of her head. We'll also check basic blood work as patient has been feeling more fatigued and does not remember one of her falls to rule out dehydration, low platelets, electrolyte abnormalities, or infection. Will monitor patient on telemetry for any signs of arrhythmia and get an EKG.    _________________________ 3:14 PM on 03/23/2017 -----------------------------------------  Lab work with no acute findings. UA with no evidence of dehydration. Platelets 44 which is improved from 6/5. No active bleeding. Head CT negative for acute injury. Patient remains at baseline. Son at the bedside. Tetanus uptodate according to patient's son. Wound care provided for the skin tear. I spent more than 10 minutes discussing risks of  having these falls especially with the low platelets which could lead to intracranial bleed, broken bones, severe injury. Patient does have a walker at home and I instructed her to make sure she is using it. Also recommend that she follows up with her primary care doctor in 2 days. Recommended that she contact her oncologist tomorrow to let him know of the results of her platelets. Patient can be discharged home at this time.  Pertinent labs & imaging results that were available during my care of the patient were reviewed by me and considered in my medical decision making (see chart for details).    ____________________________________________   FINAL CLINICAL IMPRESSION(S) / ED DIAGNOSES  Final diagnoses:  Fall, initial encounter  Traumatic hematoma of forehead, initial encounter  Skin tear of right upper arm without complication, initial encounter  Multiple abrasions      NEW MEDICATIONS STARTED DURING THIS VISIT:  New Prescriptions   No medications on file     Note:  This document was prepared using Dragon voice recognition software and may include unintentional dictation errors.    Alfred Levins, [place], MD 03/23/17 [phone]

## 2017-03-23 NOTE — ED Notes (Signed)
Pt has swollen lower extremities; in last couple of weeks they have become more swollen and are dark purple in color.

## 2017-03-23 NOTE — ED Triage Notes (Signed)
Pt to ed with c/o fall on Saturday night outside. Pt states she fell landing on her knees and needed help to get up.  Pt then fell again on Sunday am inside her home and needed help getting up. Pt with bruising noted to left forehead, although pt states she does not remember hitting her head.  Pt with severe bruising in various stages of healing to bilat upper and lower extremities.  Pt with swelling to bilat feet and severe bruising noted.  Pt reports hx of ITP, states took platelet shot last week.  Per family pt is very unstable on her feet.

## 2017-03-23 NOTE — Telephone Encounter (Signed)
Called after leaving ER where she was released as no findings other than being bruised and blood pooling into her feet. There is concern that her platelet count is only up to 44 post N Plate. Asking if she needs to be seen sooner than scheduled or recheck labs again. Please advise

## 2017-03-24 NOTE — Telephone Encounter (Signed)
Ariana [name] agrees to have her come in Friday at 10 AM

## 2017-03-26 ENCOUNTER — Inpatient Hospital Stay: Payer: Medicare HMO

## 2017-03-26 DIAGNOSIS — E7800 Pure hypercholesterolemia, unspecified: Secondary | ICD-10-CM | POA: Diagnosis not present

## 2017-03-26 DIAGNOSIS — D693 Immune thrombocytopenic purpura: Secondary | ICD-10-CM

## 2017-03-26 DIAGNOSIS — G2581 Restless legs syndrome: Secondary | ICD-10-CM | POA: Diagnosis not present

## 2017-03-26 DIAGNOSIS — F419 Anxiety disorder, unspecified: Secondary | ICD-10-CM | POA: Diagnosis not present

## 2017-03-26 DIAGNOSIS — Z79899 Other long term (current) drug therapy: Secondary | ICD-10-CM | POA: Diagnosis not present

## 2017-03-26 DIAGNOSIS — F329 Major depressive disorder, single episode, unspecified: Secondary | ICD-10-CM | POA: Diagnosis not present

## 2017-03-26 DIAGNOSIS — K219 Gastro-esophageal reflux disease without esophagitis: Secondary | ICD-10-CM | POA: Diagnosis not present

## 2017-03-26 DIAGNOSIS — K5900 Constipation, unspecified: Secondary | ICD-10-CM | POA: Diagnosis not present

## 2017-03-26 DIAGNOSIS — M1990 Unspecified osteoarthritis, unspecified site: Secondary | ICD-10-CM | POA: Diagnosis not present

## 2017-03-26 LAB — CBC WITH DIFFERENTIAL/PLATELET
Basophils Absolute: 0.2 10*3/uL — ABNORMAL HIGH (ref 0–0.1)
Basophils Relative: 2 %
EOS ABS: 0.5 10*3/uL (ref 0–0.7)
Eosinophils Relative: 3 %
HEMATOCRIT: 35.5 % (ref 35.0–47.0)
HEMOGLOBIN: 11.5 g/dL — AB (ref 12.0–16.0)
LYMPHS ABS: 3.2 10*3/uL (ref 1.0–3.6)
LYMPHS PCT: 23 %
MCH: 29.1 pg (ref 26.0–34.0)
MCHC: 32.4 g/dL (ref 32.0–36.0)
MCV: 90 fL (ref 80.0–100.0)
Monocytes Absolute: 1 10*3/uL — ABNORMAL HIGH (ref 0.2–0.9)
Monocytes Relative: 7 %
NEUTROS ABS: 9.2 10*3/uL — AB (ref 1.4–6.5)
NEUTROS PCT: 65 %
Platelets: 105 10*3/uL — ABNORMAL LOW (ref 150–400)
RBC: 3.94 MIL/uL (ref 3.80–5.20)
RDW: 17 % — ABNORMAL HIGH (ref 11.5–14.5)
WBC: 14.1 10*3/uL — AB (ref 3.6–11.0)

## 2017-03-29 NOTE — Progress Notes (Signed)
[place]  [phone]  [phone]     Ariana [name] DOB: [birth year]  MR#: [id number]  [id number]#:[id number]  Patient Care Team: Idelle Crouch, MD as PCP - General (Internal Medicine)  CHIEF COMPLAINT: Chronic refractory ITP  INTERVAL HISTORY:  Patient returns to clinic today for repeat laboratory work and consideration of additional Nplate. She currently feels well and is asymptomatic.  She was recently in the emergency room after a fall, but has now fully recovered back to her baseline. She does not complain of easy bruising. She has no neurologic complaints. She denies any fevers. She has no chest pain or shortness of breath. She denies any nausea, vomiting, constipation, or diarrhea. She has no urinary complaints. Patient offers no specific complaints today.  REVIEW OF SYSTEMS:   Review of Systems  Constitutional: Negative for fever, malaise/fatigue and weight loss.  HENT: Negative for congestion.   Respiratory: Negative.  Negative for shortness of breath.   Cardiovascular: Negative.  Negative for chest pain and leg swelling.  Gastrointestinal: Negative for abdominal pain, blood in stool, diarrhea, melena and nausea.  Genitourinary: Negative for dysuria and hematuria.  Musculoskeletal: Negative.   Skin: Negative.  Negative for rash.  Neurological: Negative.  Negative for sensory change and weakness.  Endo/Heme/Allergies: Does not bruise/bleed easily.  Psychiatric/Behavioral: Negative.  The patient is not nervous/anxious.     As per HPI. Otherwise, a complete review of systems is negative.  PAST MEDICAL HISTORY: Past Medical History:  Diagnosis Date  . Anemia   . Anxiety   . Asthma   . Chronic back pain   . Depression   . Depression   . GERD (gastroesophageal reflux disease)   . Hiatal hernia   . Hypercholesteremia   . ITP (idiopathic thrombocytopenic purpura)   . Osteoarthritis   . Osteoporosis   . Pulmonary emboli ([place])    . Restless legs     PAST SURGICAL HISTORY: Past Surgical History:  Procedure Laterality Date  . CARPAL TUNNEL RELEASE    . ESOPHAGOGASTRODUODENOSCOPY (EGD) WITH PROPOFOL N/A 09/10/2015   Procedure: ESOPHAGOGASTRODUODENOSCOPY (EGD) WITH PROPOFOL;  Surgeon: Lollie Sails, MD;  Location: [facility] ENDOSCOPY;  Service: Endoscopy;  Laterality: N/A;  . LUMBAR [place]    . SPLENECTOMY, PARTIAL    . TOTAL ABDOMINAL HYSTERECTOMY      FAMILY HISTORY Family History  Problem Relation Age of Onset  . Stroke Mother     GYNECOLOGIC HISTORY:  No LMP recorded. Patient is postmenopausal.     ADVANCED DIRECTIVES:    HEALTH MAINTENANCE: Social History  Substance Use Topics  . Smoking status: Never Smoker  . Smokeless tobacco: Never Used  . Alcohol use No     Allergies  Allergen Reactions  . Aspirin     Other reaction(s): Distress (finding)  . Diazepam     Other reaction(s): Itching of Skin sneezing  . Morphine     Other reaction(s): Itching of Skin  . Other     Other reaction(s): Unknown seasonal allergies  . Tetanus Toxoids     Other reaction(s): Localized superficial swelling of skin    Current Outpatient Prescriptions  Medication Sig Dispense Refill  . acetaminophen (TYLENOL) 325 MG tablet Take 2 tablets (650 mg total) by mouth every 6 (six) hours as needed for mild pain (or Fever >/= 101). 100 tablet 0  . albuterol (PROVENTIL HFA;VENTOLIN HFA) 108 (90 BASE) MCG/ACT inhaler Inhale 2 puffs into the lungs every 6 (six) hours as needed for  wheezing or shortness of breath.    . ALPRAZolam (XANAX) 0.5 MG tablet Take by mouth.    Marland Kitchen buPROPion (WELLBUTRIN XL) 150 MG 24 hr tablet Take 1 tablet by mouth daily.    . busPIRone (BUSPAR) 7.5 MG tablet Take 7.5 mg by mouth 2 (two) times daily.    . Calcium Carbonate-Vitamin D (CALCIUM 600+D) 600-400 MG-UNIT per tablet Take 1 tablet by mouth daily.    . ciprofloxacin (CIPRO) 500 MG tablet Take 500 mg by mouth 2 (two) times daily.      . citalopram (CELEXA) 40 MG tablet Take 40 mg by mouth daily.    . Cyanocobalamin (RA VITAMIN B-12 TR) 1000 MCG TBCR Take 1,000 mcg by mouth daily.     Marland Kitchen donepezil (ARICEPT) 5 MG tablet Take 5 mg by mouth daily.    . fluticasone (FLONASE) 50 MCG/ACT nasal spray Place into the nose.    Marland Kitchen Fluticasone-Salmeterol (ADVAIR) 250-50 MCG/DOSE AEPB Inhale 1 puff into the lungs daily as needed.     . gabapentin (NEURONTIN) 300 MG capsule 300mg  orally two times a day    . HYDROcodone-acetaminophen (NORCO) 7.5-325 MG tablet 1 tablet by mouth q 6 h prn    . levothyroxine (SYNTHROID, LEVOTHROID) 50 MCG tablet Take 50 mcg by mouth daily before breakfast. Take 30 to 60 minutes before breakfast.    . LORazepam (ATIVAN) 0.5 MG tablet Take 1 tablet (0.5 mg total) by mouth every 8 (eight) hours as needed for anxiety. 30 tablet 0  . montelukast (SINGULAIR) 10 MG tablet Take by mouth.    . Multiple Vitamins-Minerals (HAIR SKIN AND NAILS FORMULA PO) Take 1 tablet by mouth every evening.    . Multiple Vitamins-Minerals (OCUVITE-LUTEIN PO) Take 1 capsule by mouth daily.     Marland Kitchen oxybutynin (DITROPAN) 5 MG tablet Take 5 mg by mouth 2 (two) times daily.     . pantoprazole (PROTONIX) 20 MG tablet Take 20 mg by mouth 2 (two) times daily.     . polycarbophil (FIBERCON) 625 MG tablet Take by mouth.    . pravastatin (PRAVACHOL) 20 MG tablet Take 20 mg by mouth daily.    . traMADol (ULTRAM) 50 MG tablet Take 50 mg by mouth 3 (three) times daily as needed for moderate pain or severe pain. pain    . vitamin C (ASCORBIC ACID) 500 MG tablet Take 500 mg by mouth daily.    . vitamin E (E-400) 400 UNIT capsule Take 400 Units by mouth daily.    Marland Kitchen zolpidem (AMBIEN) 5 MG tablet Take 5 mg by mouth at bedtime.     . memantine (NAMENDA) 5 MG tablet Take 5 mg by mouth 2 (two) times daily.      No current facility-administered medications for this visit.     OBJECTIVE: BP (!) 111/57 (BP Location: Left Arm, Patient Position: Sitting)    Pulse 61   Temp (!) 96.8 F (36 C) (Tympanic)   Resp 16   Wt 147 lb 2 oz (66.7 kg)   BMI 24.48 kg/m    Body mass index is 24.48 kg/m.    ECOG FS:1 - Symptomatic but completely ambulatory  General: Well-developed, well-nourished, no acute distress. Eyes: Pink conjunctiva, anicteric sclera. HEENT: Normocephalic, moist mucous membranes, clear oropharnyx. Lungs: Clear to auscultation bilaterally. Heart: Regular rate and rhythm. No rubs, murmurs, or gallops. Musculoskeletal: No edema, cyanosis, or clubbing. Neuro: Alert, answering all questions appropriately. Cranial nerves grossly intact. Skin: No petechiae noted. Bruising noted in various stages  of healing. Psych: Normal affect.    LAB RESULTS:  Appointment on 03/30/2017  Component Date Value Ref Range Status  . WBC 03/30/2017 11.2* 3.6 - 11.0 K/uL Final  . RBC 03/30/2017 3.92  3.80 - 5.20 MIL/uL Final  . Hemoglobin 03/30/2017 11.4* 12.0 - 16.0 g/dL Final  . HCT 03/30/2017 35.0  35.0 - 47.0 % Final  . MCV 03/30/2017 89.2  80.0 - 100.0 fL Final  . MCH 03/30/2017 29.0  26.0 - 34.0 pg Final  . MCHC 03/30/2017 32.5  32.0 - 36.0 g/dL Final  . RDW 03/30/2017 16.8* 11.5 - 14.5 % Final  . Platelets 03/30/2017 124* 150 - 400 K/uL Final   Comment: PLATELET COUNT CONFIRMED BY SMEAR GIANT PLATELETS SEEN   . Neutrophils Relative % 03/30/2017 58  % Final  . Neutro Abs 03/30/2017 6.4  1.4 - 6.5 K/uL Final  . Lymphocytes Relative 03/30/2017 26  % Final  . Lymphs Abs 03/30/2017 2.9  1.0 - 3.6 K/uL Final  . Monocytes Relative 03/30/2017 10  % Final  . Monocytes Absolute 03/30/2017 1.2* 0.2 - 0.9 K/uL Final  . Eosinophils Relative 03/30/2017 5  % Final  . Eosinophils Absolute 03/30/2017 0.6  0 - 0.7 K/uL Final  . Basophils Relative 03/30/2017 1  % Final  . Basophils Absolute 03/30/2017 0.1  0 - 0.1 K/uL Final    STUDIES: No results found.  ASSESSMENT:  Chronic refractory ITP  PLAN:   1. Chronic refractory ITP: Patient had a poor  response to Prednisone, WinRho, and Rituxan. She is status post splenectomy in 2007. Her platelet count Is trending up after her most recent injection of 250 g Nplate on March 17, 8415. She does not require Nplate today. Return to clinic in 2 weeks for laboratory work and consideration of Nplate and then in 1 month for laboratory work, further evaluation, and consideration of Nplate if her platelet count falls below 100. Patient appears to only requiring injections every 3-4 weeks. Can consider Promacta 12.5mg  at a later date if necessary. Max dose of Nplate is 10 mcg/kg. 2. Abdominal pain: Resolved. CT scan did not reveal any distinct etiology. 3. Constipation: Patient does not complain of this today.  Approximately 30 minutes spent in discussion of which greater than 50% was consultation.  Patient expressed understanding and was in agreement with this plan. She also understands that She can call clinic at any time with any questions, concerns, or complaints.    Ariana Huger, MD   04/01/2017 7:53 AM

## 2017-03-30 ENCOUNTER — Inpatient Hospital Stay: Payer: Medicare HMO

## 2017-03-30 ENCOUNTER — Inpatient Hospital Stay (HOSPITAL_BASED_OUTPATIENT_CLINIC_OR_DEPARTMENT_OTHER): Payer: Medicare HMO | Admitting: Oncology

## 2017-03-30 VITALS — BP 111/57 | HR 61 | Temp 96.8°F | Resp 16 | Wt 147.1 lb

## 2017-03-30 DIAGNOSIS — Z79899 Other long term (current) drug therapy: Secondary | ICD-10-CM | POA: Diagnosis not present

## 2017-03-30 DIAGNOSIS — D693 Immune thrombocytopenic purpura: Secondary | ICD-10-CM

## 2017-03-30 DIAGNOSIS — G2581 Restless legs syndrome: Secondary | ICD-10-CM | POA: Diagnosis not present

## 2017-03-30 DIAGNOSIS — M1990 Unspecified osteoarthritis, unspecified site: Secondary | ICD-10-CM | POA: Diagnosis not present

## 2017-03-30 DIAGNOSIS — F419 Anxiety disorder, unspecified: Secondary | ICD-10-CM | POA: Diagnosis not present

## 2017-03-30 DIAGNOSIS — F329 Major depressive disorder, single episode, unspecified: Secondary | ICD-10-CM | POA: Diagnosis not present

## 2017-03-30 DIAGNOSIS — E7800 Pure hypercholesterolemia, unspecified: Secondary | ICD-10-CM | POA: Diagnosis not present

## 2017-03-30 DIAGNOSIS — K5900 Constipation, unspecified: Secondary | ICD-10-CM | POA: Diagnosis not present

## 2017-03-30 DIAGNOSIS — K219 Gastro-esophageal reflux disease without esophagitis: Secondary | ICD-10-CM | POA: Diagnosis not present

## 2017-03-30 LAB — CBC WITH DIFFERENTIAL/PLATELET
BASOS ABS: 0.1 10*3/uL (ref 0–0.1)
BASOS PCT: 1 %
EOS ABS: 0.6 10*3/uL (ref 0–0.7)
EOS PCT: 5 %
HCT: 35 % (ref 35.0–47.0)
Hemoglobin: 11.4 g/dL — ABNORMAL LOW (ref 12.0–16.0)
Lymphocytes Relative: 26 %
Lymphs Abs: 2.9 10*3/uL (ref 1.0–3.6)
MCH: 29 pg (ref 26.0–34.0)
MCHC: 32.5 g/dL (ref 32.0–36.0)
MCV: 89.2 fL (ref 80.0–100.0)
MONO ABS: 1.2 10*3/uL — AB (ref 0.2–0.9)
Monocytes Relative: 10 %
Neutro Abs: 6.4 10*3/uL (ref 1.4–6.5)
Neutrophils Relative %: 58 %
PLATELETS: 124 10*3/uL — AB (ref 150–400)
RBC: 3.92 MIL/uL (ref 3.80–5.20)
RDW: 16.8 % — AB (ref 11.5–14.5)
WBC: 11.2 10*3/uL — AB (ref 3.6–11.0)

## 2017-03-30 NOTE — Progress Notes (Signed)
Patient here today for follow up  Patient c/o both feet swelling started about 4-5 days ago.

## 2017-04-08 DIAGNOSIS — J4530 Mild persistent asthma, uncomplicated: Secondary | ICD-10-CM | POA: Diagnosis not present

## 2017-04-08 DIAGNOSIS — F419 Anxiety disorder, unspecified: Secondary | ICD-10-CM | POA: Diagnosis not present

## 2017-04-08 DIAGNOSIS — I10 Essential (primary) hypertension: Secondary | ICD-10-CM | POA: Diagnosis not present

## 2017-04-08 DIAGNOSIS — G301 Alzheimer's disease with late onset: Secondary | ICD-10-CM | POA: Diagnosis not present

## 2017-04-08 DIAGNOSIS — G894 Chronic pain syndrome: Secondary | ICD-10-CM | POA: Diagnosis not present

## 2017-04-08 DIAGNOSIS — E039 Hypothyroidism, unspecified: Secondary | ICD-10-CM | POA: Diagnosis not present

## 2017-04-08 DIAGNOSIS — F5104 Psychophysiologic insomnia: Secondary | ICD-10-CM | POA: Diagnosis not present

## 2017-04-08 DIAGNOSIS — E7800 Pure hypercholesterolemia, unspecified: Secondary | ICD-10-CM | POA: Diagnosis not present

## 2017-04-08 DIAGNOSIS — I872 Venous insufficiency (chronic) (peripheral): Secondary | ICD-10-CM | POA: Diagnosis not present

## 2017-04-12 ENCOUNTER — Telehealth: Payer: Self-pay | Admitting: *Deleted

## 2017-04-12 NOTE — Telephone Encounter (Signed)
Per Husband, he was able to get the bleeding stopped and will just keep her appt for tomorrow as planned. If she starts bleeding again, he will take her to the ER

## 2017-04-12 NOTE — Telephone Encounter (Signed)
Patient has been scratched by dog and is bleed ing ; she has an appt for platelet count and N Plate tomorrow, Asking if she can ome here today so she does not have to go to the ER. Please advise

## 2017-04-12 NOTE — Telephone Encounter (Signed)
Per Dr Grayland Ormond, [place] to come in today for lab/Nplate and she can see Sonia Baller if needed. I returned call to [facility] who asked I call Mr Virella and when I did. I got his VM and so I called Simona Huh back and he said he will get in touch with him and have hem call back for appt time

## 2017-04-13 ENCOUNTER — Inpatient Hospital Stay: Payer: Medicare HMO

## 2017-04-13 ENCOUNTER — Inpatient Hospital Stay: Payer: Medicare HMO | Attending: Oncology

## 2017-04-13 DIAGNOSIS — D693 Immune thrombocytopenic purpura: Secondary | ICD-10-CM | POA: Diagnosis not present

## 2017-04-13 DIAGNOSIS — Z86711 Personal history of pulmonary embolism: Secondary | ICD-10-CM | POA: Diagnosis not present

## 2017-04-13 DIAGNOSIS — G2581 Restless legs syndrome: Secondary | ICD-10-CM | POA: Diagnosis not present

## 2017-04-13 DIAGNOSIS — G8929 Other chronic pain: Secondary | ICD-10-CM | POA: Diagnosis not present

## 2017-04-13 DIAGNOSIS — M1990 Unspecified osteoarthritis, unspecified site: Secondary | ICD-10-CM | POA: Diagnosis not present

## 2017-04-13 DIAGNOSIS — Z79899 Other long term (current) drug therapy: Secondary | ICD-10-CM | POA: Insufficient documentation

## 2017-04-13 DIAGNOSIS — M549 Dorsalgia, unspecified: Secondary | ICD-10-CM | POA: Insufficient documentation

## 2017-04-13 DIAGNOSIS — F418 Other specified anxiety disorders: Secondary | ICD-10-CM | POA: Diagnosis not present

## 2017-04-13 DIAGNOSIS — K219 Gastro-esophageal reflux disease without esophagitis: Secondary | ICD-10-CM | POA: Insufficient documentation

## 2017-04-13 DIAGNOSIS — E7800 Pure hypercholesterolemia, unspecified: Secondary | ICD-10-CM | POA: Insufficient documentation

## 2017-04-13 LAB — CBC WITH DIFFERENTIAL/PLATELET
BASOS ABS: 0.2 10*3/uL — AB (ref 0–0.1)
Basophils Relative: 2 %
Eosinophils Absolute: 0.3 10*3/uL (ref 0–0.7)
Eosinophils Relative: 2 %
HEMATOCRIT: 33.6 % — AB (ref 35.0–47.0)
Hemoglobin: 11.1 g/dL — ABNORMAL LOW (ref 12.0–16.0)
LYMPHS ABS: 4.5 10*3/uL — AB (ref 1.0–3.6)
Lymphocytes Relative: 32 %
MCH: 29.1 pg (ref 26.0–34.0)
MCHC: 33 g/dL (ref 32.0–36.0)
MCV: 88.2 fL (ref 80.0–100.0)
MONO ABS: 1.1 10*3/uL — AB (ref 0.2–0.9)
MONOS PCT: 8 %
NEUTROS ABS: 7.9 10*3/uL — AB (ref 1.4–6.5)
Neutrophils Relative %: 56 %
Platelets: 12 10*3/uL — CL (ref 150–440)
RBC: 3.81 MIL/uL (ref 3.80–5.20)
RDW: 17 % — AB (ref 11.5–14.5)
WBC: 13.9 10*3/uL — ABNORMAL HIGH (ref 3.6–11.0)

## 2017-04-13 MED ORDER — ROMIPLOSTIM INJECTION 500 MCG
5.0000 ug/kg | Freq: Once | SUBCUTANEOUS | Status: AC
  Administered 2017-04-13: 335 ug via SUBCUTANEOUS
  Filled 2017-04-13: qty 0.67

## 2017-04-19 DIAGNOSIS — D473 Essential (hemorrhagic) thrombocythemia: Secondary | ICD-10-CM | POA: Diagnosis not present

## 2017-04-19 DIAGNOSIS — M62830 Muscle spasm of back: Secondary | ICD-10-CM | POA: Diagnosis not present

## 2017-04-25 NOTE — Progress Notes (Signed)
[place]  Telephone:(336) [phone]  Fax:([phone]     Ariana [name] DOB: 21-Aug-1933  MR#: [id number]  TML#:[id number]  Patient Care Team: Idelle Crouch, MD as PCP - General (Internal Medicine)  CHIEF COMPLAINT: Chronic refractory ITP  INTERVAL HISTORY:  Patient returns to clinic today for repeat laboratory work and consideration of additional Nplate. She currently feels well and is asymptomatic. She does not complain of easy bruising. She has no neurologic complaints. She denies any fevers. She has no chest pain or shortness of breath. She denies any nausea, vomiting, constipation, or diarrhea. She has no urinary complaints. Patient offers no specific complaints today.  REVIEW OF SYSTEMS:   Review of Systems  Constitutional: Negative for fever, malaise/fatigue and weight loss.  HENT: Negative for congestion.   Respiratory: Negative.  Negative for shortness of breath.   Cardiovascular: Negative.  Negative for chest pain and leg swelling.  Gastrointestinal: Negative for abdominal pain, blood in stool, diarrhea, melena and nausea.  Genitourinary: Negative for dysuria and hematuria.  Musculoskeletal: Negative.   Skin: Negative.  Negative for rash.  Neurological: Negative.  Negative for sensory change and weakness.  Endo/Heme/Allergies: Does not bruise/bleed easily.  Psychiatric/Behavioral: Negative.  The patient is not nervous/anxious.     As per HPI. Otherwise, a complete review of systems is negative.  PAST MEDICAL HISTORY: Past Medical History:  Diagnosis Date  . Anemia   . Anxiety   . Asthma   . Chronic back pain   . Depression   . Depression   . GERD (gastroesophageal reflux disease)   . Hiatal hernia   . Hypercholesteremia   . ITP (idiopathic thrombocytopenic purpura)   . Osteoarthritis   . Osteoporosis   . Pulmonary emboli ([place])   . Restless legs     PAST SURGICAL HISTORY: Past Surgical History:  Procedure Laterality Date    . CARPAL TUNNEL RELEASE    . ESOPHAGOGASTRODUODENOSCOPY (EGD) WITH PROPOFOL N/A 09/10/2015   Procedure: ESOPHAGOGASTRODUODENOSCOPY (EGD) WITH PROPOFOL;  Surgeon: Lollie Sails, MD;  Location: [facility] ENDOSCOPY;  Service: Endoscopy;  Laterality: N/A;  . LUMBAR [place]    . SPLENECTOMY, PARTIAL    . TOTAL ABDOMINAL HYSTERECTOMY      FAMILY HISTORY Family History  Problem Relation Age of Onset  . Stroke Mother     GYNECOLOGIC HISTORY:  No LMP recorded. Patient is postmenopausal.     ADVANCED DIRECTIVES:    HEALTH MAINTENANCE: Social History  Substance Use Topics  . Smoking status: Never Smoker  . Smokeless tobacco: Never Used  . Alcohol use No     Allergies  Allergen Reactions  . Aspirin     Other reaction(s): Distress (finding)  . Diazepam     Other reaction(s): Itching of Skin sneezing  . Morphine     Other reaction(s): Itching of Skin  . Other     Other reaction(s): Unknown seasonal allergies  . Tetanus Toxoids     Other reaction(s): Localized superficial swelling of skin    Current Outpatient Prescriptions  Medication Sig Dispense Refill  . acetaminophen (TYLENOL) 325 MG tablet Take 2 tablets (650 mg total) by mouth every 6 (six) hours as needed for mild pain (or Fever >/= 101). 100 tablet 0  . albuterol (PROVENTIL HFA;VENTOLIN HFA) 108 (90 BASE) MCG/ACT inhaler Inhale 2 puffs into the lungs every 6 (six) hours as needed for wheezing or shortness of breath.    . ALPRAZolam (XANAX) 0.5 MG tablet Take by mouth.    Marland Kitchen  buPROPion (WELLBUTRIN XL) 150 MG 24 hr tablet Take 1 tablet by mouth daily.    . busPIRone (BUSPAR) 7.5 MG tablet Take 7.5 mg by mouth 2 (two) times daily.    . Calcium Carbonate-Vitamin D (CALCIUM 600+D) 600-400 MG-UNIT per tablet Take 1 tablet by mouth daily.    . citalopram (CELEXA) 40 MG tablet Take 40 mg by mouth daily.    . Cyanocobalamin (RA VITAMIN B-12 TR) 1000 MCG TBCR Take 1,000 mcg by mouth daily.     Marland Kitchen donepezil (ARICEPT) 5 MG  tablet Take 5 mg by mouth daily.    . fluticasone (FLONASE) 50 MCG/ACT nasal spray Place into the nose.    Marland Kitchen Fluticasone-Salmeterol (ADVAIR) 250-50 MCG/DOSE AEPB Inhale 1 puff into the lungs daily as needed.     . gabapentin (NEURONTIN) 300 MG capsule 300mg  orally two times a day    . HYDROcodone-acetaminophen (NORCO) 7.5-325 MG tablet 1 tablet by mouth q 6 h prn    . levothyroxine (SYNTHROID, LEVOTHROID) 50 MCG tablet Take 50 mcg by mouth daily before breakfast. Take 30 to 60 minutes before breakfast.    . LORazepam (ATIVAN) 0.5 MG tablet Take 1 tablet (0.5 mg total) by mouth every 8 (eight) hours as needed for anxiety. 30 tablet 0  . montelukast (SINGULAIR) 10 MG tablet Take by mouth.    . Multiple Vitamins-Minerals (HAIR SKIN AND NAILS FORMULA PO) Take 1 tablet by mouth every evening.    . Multiple Vitamins-Minerals (OCUVITE-LUTEIN PO) Take 1 capsule by mouth daily.     Marland Kitchen oxybutynin (DITROPAN) 5 MG tablet Take 5 mg by mouth 2 (two) times daily.     . pantoprazole (PROTONIX) 20 MG tablet Take 20 mg by mouth 2 (two) times daily.     . polycarbophil (FIBERCON) 625 MG tablet Take by mouth.    . pravastatin (PRAVACHOL) 20 MG tablet Take 20 mg by mouth daily.    . traMADol (ULTRAM) 50 MG tablet Take 50 mg by mouth 3 (three) times daily as needed for moderate pain or severe pain. pain    . vitamin C (ASCORBIC ACID) 500 MG tablet Take 500 mg by mouth daily.    . vitamin E (E-400) 400 UNIT capsule Take 400 Units by mouth daily.    Marland Kitchen zolpidem (AMBIEN) 5 MG tablet Take 5 mg by mouth at bedtime.     . memantine (NAMENDA) 5 MG tablet Take 5 mg by mouth 2 (two) times daily.      No current facility-administered medications for this visit.     OBJECTIVE: BP (!) 159/63   Pulse (!) 53   Temp (!) 97.1 F (36.2 C) (Tympanic)   Resp 20   Wt 149 lb 1.6 oz (67.6 kg)   BMI 24.81 kg/m    Body mass index is 24.81 kg/m.    ECOG FS:1 - Symptomatic but completely ambulatory  General: Well-developed,  well-nourished, no acute distress. Eyes: Pink conjunctiva, anicteric sclera. HEENT: Normocephalic, moist mucous membranes, clear oropharnyx. Lungs: Clear to auscultation bilaterally. Heart: Regular rate and rhythm. No rubs, murmurs, or gallops. Musculoskeletal: No edema, cyanosis, or clubbing. Neuro: Alert, answering all questions appropriately. Cranial nerves grossly intact. Skin: No petechiae noted. Bruising noted in various stages of healing. Psych: Normal affect.   LAB RESULTS:  Appointment on 04/27/2017  Component Date Value Ref Range Status  . WBC 04/27/2017 13.8* 3.6 - 11.0 K/uL Final  . RBC 04/27/2017 3.63* 3.80 - 5.20 MIL/uL Final  . Hemoglobin 04/27/2017 10.3* 12.0 -  16.0 g/dL Final  . HCT 04/27/2017 32.0* 35.0 - 47.0 % Final  . MCV 04/27/2017 88.0  80.0 - 100.0 fL Final  . MCH 04/27/2017 28.4  26.0 - 34.0 pg Final  . MCHC 04/27/2017 32.2  32.0 - 36.0 g/dL Final  . RDW 04/27/2017 17.2* 11.5 - 14.5 % Final  . Platelets 04/27/2017 125* 150 - 400 K/uL Final   Comment: PLATELET COUNT CONFIRMED BY SMEAR GIANT PLATELETS SEEN PLATELETS VARY IN SIZE PLATELET CLUMPS NOTED ON SMEAR   . Neutrophils Relative % 04/27/2017 69  % Final  . Neutro Abs 04/27/2017 9.7* 1.4 - 6.5 K/uL Final  . Lymphocytes Relative 04/27/2017 20  % Final  . Lymphs Abs 04/27/2017 2.7  1.0 - 3.6 K/uL Final  . Monocytes Relative 04/27/2017 8  % Final  . Monocytes Absolute 04/27/2017 1.1* 0.2 - 0.9 K/uL Final  . Eosinophils Relative 04/27/2017 2  % Final  . Eosinophils Absolute 04/27/2017 0.2  0 - 0.7 K/uL Final  . Basophils Relative 04/27/2017 1  % Final  . Basophils Absolute 04/27/2017 0.1  0 - 0.1 K/uL Final    STUDIES: No results found.  ASSESSMENT:  Chronic refractory ITP  PLAN:   1. Chronic refractory ITP: Patient had a poor response to Prednisone, WinRho, and Rituxan. She is status post splenectomy in 2007. Her platelet count is greater then 100, therefore she does not require Nplate today.  Return to clinic in 2 and 4 weeks for laboratory work and consideration of Nplate and then in 6 weeks for laboratory work, further evaluation, and consideration of Nplate if her platelet count falls below 100. Patient appears to only requiring injections every 3-4 weeks. Can consider Promacta 12.5mg  at a later date if necessary. Max dose of Nplate is 10 mcg/kg. 2. Abdominal pain: Resolved. CT scan did not reveal any distinct etiology. 3. Constipation: Patient does not complain of this today.  Approximately 30 minutes spent in discussion of which greater than 50% was consultation.  Patient expressed understanding and was in agreement with this plan. She also understands that She can call clinic at any time with any questions, concerns, or complaints.    Lloyd Huger, MD   05/02/2017 9:32 AM

## 2017-04-27 ENCOUNTER — Inpatient Hospital Stay: Payer: Medicare HMO

## 2017-04-27 ENCOUNTER — Inpatient Hospital Stay (HOSPITAL_BASED_OUTPATIENT_CLINIC_OR_DEPARTMENT_OTHER): Payer: Medicare HMO | Admitting: Oncology

## 2017-04-27 VITALS — BP 159/63 | HR 53 | Temp 97.1°F | Resp 20 | Wt 149.1 lb

## 2017-04-27 DIAGNOSIS — D693 Immune thrombocytopenic purpura: Secondary | ICD-10-CM | POA: Diagnosis not present

## 2017-04-27 DIAGNOSIS — K219 Gastro-esophageal reflux disease without esophagitis: Secondary | ICD-10-CM

## 2017-04-27 DIAGNOSIS — Z79899 Other long term (current) drug therapy: Secondary | ICD-10-CM

## 2017-04-27 DIAGNOSIS — M549 Dorsalgia, unspecified: Secondary | ICD-10-CM

## 2017-04-27 DIAGNOSIS — M1990 Unspecified osteoarthritis, unspecified site: Secondary | ICD-10-CM | POA: Diagnosis not present

## 2017-04-27 DIAGNOSIS — E7800 Pure hypercholesterolemia, unspecified: Secondary | ICD-10-CM | POA: Diagnosis not present

## 2017-04-27 DIAGNOSIS — G8929 Other chronic pain: Secondary | ICD-10-CM

## 2017-04-27 DIAGNOSIS — G2581 Restless legs syndrome: Secondary | ICD-10-CM

## 2017-04-27 DIAGNOSIS — F418 Other specified anxiety disorders: Secondary | ICD-10-CM | POA: Diagnosis not present

## 2017-04-27 DIAGNOSIS — Z86711 Personal history of pulmonary embolism: Secondary | ICD-10-CM | POA: Diagnosis not present

## 2017-04-27 LAB — CBC WITH DIFFERENTIAL/PLATELET
BASOS ABS: 0.1 10*3/uL (ref 0–0.1)
Basophils Relative: 1 %
EOS ABS: 0.2 10*3/uL (ref 0–0.7)
Eosinophils Relative: 2 %
HCT: 32 % — ABNORMAL LOW (ref 35.0–47.0)
HEMOGLOBIN: 10.3 g/dL — AB (ref 12.0–16.0)
LYMPHS ABS: 2.7 10*3/uL (ref 1.0–3.6)
LYMPHS PCT: 20 %
MCH: 28.4 pg (ref 26.0–34.0)
MCHC: 32.2 g/dL (ref 32.0–36.0)
MCV: 88 fL (ref 80.0–100.0)
Monocytes Absolute: 1.1 10*3/uL — ABNORMAL HIGH (ref 0.2–0.9)
Monocytes Relative: 8 %
NEUTROS PCT: 69 %
Neutro Abs: 9.7 10*3/uL — ABNORMAL HIGH (ref 1.4–6.5)
PLATELETS: 125 10*3/uL — AB (ref 150–400)
RBC: 3.63 MIL/uL — AB (ref 3.80–5.20)
RDW: 17.2 % — ABNORMAL HIGH (ref 11.5–14.5)
WBC: 13.8 10*3/uL — AB (ref 3.6–11.0)

## 2017-04-27 NOTE — Progress Notes (Signed)
Patient denies any concerns today.  

## 2017-05-11 ENCOUNTER — Inpatient Hospital Stay: Payer: Medicare HMO

## 2017-05-11 DIAGNOSIS — D693 Immune thrombocytopenic purpura: Secondary | ICD-10-CM

## 2017-05-11 DIAGNOSIS — M549 Dorsalgia, unspecified: Secondary | ICD-10-CM | POA: Diagnosis not present

## 2017-05-11 DIAGNOSIS — E7800 Pure hypercholesterolemia, unspecified: Secondary | ICD-10-CM | POA: Diagnosis not present

## 2017-05-11 DIAGNOSIS — G2581 Restless legs syndrome: Secondary | ICD-10-CM | POA: Diagnosis not present

## 2017-05-11 DIAGNOSIS — M1990 Unspecified osteoarthritis, unspecified site: Secondary | ICD-10-CM | POA: Diagnosis not present

## 2017-05-11 DIAGNOSIS — G8929 Other chronic pain: Secondary | ICD-10-CM | POA: Diagnosis not present

## 2017-05-11 DIAGNOSIS — F418 Other specified anxiety disorders: Secondary | ICD-10-CM | POA: Diagnosis not present

## 2017-05-11 DIAGNOSIS — Z79899 Other long term (current) drug therapy: Secondary | ICD-10-CM | POA: Diagnosis not present

## 2017-05-11 DIAGNOSIS — K219 Gastro-esophageal reflux disease without esophagitis: Secondary | ICD-10-CM | POA: Diagnosis not present

## 2017-05-11 LAB — CBC WITH DIFFERENTIAL/PLATELET
BASOS ABS: 0.2 10*3/uL — AB (ref 0–0.1)
BASOS PCT: 1 %
Eosinophils Absolute: 0.2 10*3/uL (ref 0–0.7)
Eosinophils Relative: 2 %
HEMATOCRIT: 39.2 % (ref 35.0–47.0)
HEMOGLOBIN: 12.7 g/dL (ref 12.0–16.0)
LYMPHS PCT: 24 %
Lymphs Abs: 2.7 10*3/uL (ref 1.0–3.6)
MCH: 28.4 pg (ref 26.0–34.0)
MCHC: 32.4 g/dL (ref 32.0–36.0)
MCV: 87.7 fL (ref 80.0–100.0)
MONOS PCT: 7 %
Monocytes Absolute: 0.8 10*3/uL (ref 0.2–0.9)
NEUTROS ABS: 7.5 10*3/uL — AB (ref 1.4–6.5)
NEUTROS PCT: 66 %
Platelets: 30 10*3/uL — ABNORMAL LOW (ref 150–400)
RBC: 4.47 MIL/uL (ref 3.80–5.20)
RDW: 16.6 % — ABNORMAL HIGH (ref 11.5–14.5)
WBC: 11.4 10*3/uL — ABNORMAL HIGH (ref 3.6–11.0)

## 2017-05-11 MED ORDER — ROMIPLOSTIM INJECTION 500 MCG
400.0000 ug | Freq: Once | SUBCUTANEOUS | Status: AC
  Administered 2017-05-11: 400 ug via SUBCUTANEOUS
  Filled 2017-05-11: qty 0.8

## 2017-05-25 ENCOUNTER — Inpatient Hospital Stay: Payer: Medicare HMO

## 2017-05-25 ENCOUNTER — Inpatient Hospital Stay: Payer: Medicare HMO | Attending: Oncology

## 2017-05-25 DIAGNOSIS — Z86711 Personal history of pulmonary embolism: Secondary | ICD-10-CM | POA: Diagnosis not present

## 2017-05-25 DIAGNOSIS — M549 Dorsalgia, unspecified: Secondary | ICD-10-CM | POA: Insufficient documentation

## 2017-05-25 DIAGNOSIS — K219 Gastro-esophageal reflux disease without esophagitis: Secondary | ICD-10-CM | POA: Diagnosis not present

## 2017-05-25 DIAGNOSIS — G8929 Other chronic pain: Secondary | ICD-10-CM | POA: Diagnosis not present

## 2017-05-25 DIAGNOSIS — Z79899 Other long term (current) drug therapy: Secondary | ICD-10-CM | POA: Insufficient documentation

## 2017-05-25 DIAGNOSIS — R5381 Other malaise: Secondary | ICD-10-CM | POA: Diagnosis not present

## 2017-05-25 DIAGNOSIS — F418 Other specified anxiety disorders: Secondary | ICD-10-CM | POA: Insufficient documentation

## 2017-05-25 DIAGNOSIS — R5383 Other fatigue: Secondary | ICD-10-CM | POA: Diagnosis not present

## 2017-05-25 DIAGNOSIS — E7800 Pure hypercholesterolemia, unspecified: Secondary | ICD-10-CM | POA: Diagnosis not present

## 2017-05-25 DIAGNOSIS — M1990 Unspecified osteoarthritis, unspecified site: Secondary | ICD-10-CM | POA: Diagnosis not present

## 2017-05-25 DIAGNOSIS — R531 Weakness: Secondary | ICD-10-CM | POA: Insufficient documentation

## 2017-05-25 DIAGNOSIS — D693 Immune thrombocytopenic purpura: Secondary | ICD-10-CM | POA: Diagnosis not present

## 2017-05-25 LAB — CBC WITH DIFFERENTIAL/PLATELET
Basophils Absolute: 0.1 10*3/uL (ref 0–0.1)
Basophils Relative: 1 %
EOS ABS: 0.2 10*3/uL (ref 0–0.7)
Eosinophils Relative: 2 %
HCT: 34.8 % — ABNORMAL LOW (ref 35.0–47.0)
HEMOGLOBIN: 11 g/dL — AB (ref 12.0–16.0)
LYMPHS ABS: 3.3 10*3/uL (ref 1.0–3.6)
Lymphocytes Relative: 28 %
MCH: 27.4 pg (ref 26.0–34.0)
MCHC: 31.7 g/dL — AB (ref 32.0–36.0)
MCV: 86.4 fL (ref 80.0–100.0)
MONOS PCT: 8 %
Monocytes Absolute: 0.9 10*3/uL (ref 0.2–0.9)
NEUTROS PCT: 61 %
Neutro Abs: 7.3 10*3/uL — ABNORMAL HIGH (ref 1.4–6.5)
Platelets: 187 10*3/uL (ref 150–440)
RBC: 4.03 MIL/uL (ref 3.80–5.20)
RDW: 16.8 % — ABNORMAL HIGH (ref 11.5–14.5)
WBC: 11.8 10*3/uL — ABNORMAL HIGH (ref 3.6–11.0)

## 2017-06-06 NOTE — Progress Notes (Signed)
[place]  Telephone:([phone]  Fax:(336) [phone]     Ariana [name] DOB: 01-08-1933  MR#: [id number]  ALP#:[id number]  Patient Care Team: Idelle Crouch, MD as PCP - General (Internal Medicine)  CHIEF COMPLAINT: Chronic refractory ITP  INTERVAL HISTORY:  Patient returns to clinic today for repeat laboratory work and consideration of additional Nplate. She has noticed increased weak and fatigued over the past 1-2 weeks, but otherwise has felt well. She does not complain of easy bruising. She has no neurologic complaints. She denies any fevers. She has no chest pain or shortness of breath. She denies any nausea, vomiting, constipation, or diarrhea. She has no urinary complaints. Patient offers no further specific complaints today.  REVIEW OF SYSTEMS:   Review of Systems  Constitutional: Positive for malaise/fatigue. Negative for fever and weight loss.  HENT: Negative for congestion.   Respiratory: Negative.  Negative for shortness of breath.   Cardiovascular: Negative.  Negative for chest pain and leg swelling.  Gastrointestinal: Negative for abdominal pain, blood in stool, diarrhea, melena and nausea.  Genitourinary: Negative.  Negative for dysuria and hematuria.  Musculoskeletal: Negative.   Skin: Negative.  Negative for rash.  Neurological: Positive for weakness. Negative for sensory change.  Endo/Heme/Allergies: Does not bruise/bleed easily.  Psychiatric/Behavioral: Negative.  The patient is not nervous/anxious.     As per HPI. Otherwise, a complete review of systems is negative.  PAST MEDICAL HISTORY: Past Medical History:  Diagnosis Date  . Anemia   . Anxiety   . Asthma   . Chronic back pain   . Depression   . Depression   . GERD (gastroesophageal reflux disease)   . Hiatal hernia   . Hypercholesteremia   . ITP (idiopathic thrombocytopenic purpura)   . Osteoarthritis   . Osteoporosis   . Pulmonary emboli ([place])   . Restless legs       PAST SURGICAL HISTORY: Past Surgical History:  Procedure Laterality Date  . CARPAL TUNNEL RELEASE    . ESOPHAGOGASTRODUODENOSCOPY (EGD) WITH PROPOFOL N/A 09/10/2015   Procedure: ESOPHAGOGASTRODUODENOSCOPY (EGD) WITH PROPOFOL;  Surgeon: Lollie Sails, MD;  Location: [facility] ENDOSCOPY;  Service: Endoscopy;  Laterality: N/A;  . LUMBAR [place]    . SPLENECTOMY, PARTIAL    . TOTAL ABDOMINAL HYSTERECTOMY      FAMILY HISTORY Family History  Problem Relation Age of Onset  . Stroke Mother     GYNECOLOGIC HISTORY:  No LMP recorded. Patient is postmenopausal.     ADVANCED DIRECTIVES:    HEALTH MAINTENANCE: Social History  Substance Use Topics  . Smoking status: Never Smoker  . Smokeless tobacco: Never Used  . Alcohol use No     Allergies  Allergen Reactions  . Aspirin     Other reaction(s): Distress (finding)  . Diazepam     Other reaction(s): Itching of Skin sneezing  . Morphine     Other reaction(s): Itching of Skin  . Other     Other reaction(s): Unknown seasonal allergies  . Tetanus Toxoids     Other reaction(s): Localized superficial swelling of skin    Current Outpatient Prescriptions  Medication Sig Dispense Refill  . acetaminophen (TYLENOL) 325 MG tablet Take 2 tablets (650 mg total) by mouth every 6 (six) hours as needed for mild pain (or Fever >/= 101). 100 tablet 0  . albuterol (PROVENTIL HFA;VENTOLIN HFA) 108 (90 BASE) MCG/ACT inhaler Inhale 2 puffs into the lungs every 6 (six) hours as needed for wheezing or shortness of breath.    Marland Kitchen  ALPRAZolam (XANAX) 0.5 MG tablet Take by mouth.    Marland Kitchen buPROPion (WELLBUTRIN XL) 150 MG 24 hr tablet Take 1 tablet by mouth daily.    . busPIRone (BUSPAR) 7.5 MG tablet Take 7.5 mg by mouth 2 (two) times daily.    . Calcium Carbonate-Vitamin D (CALCIUM 600+D) 600-400 MG-UNIT per tablet Take 1 tablet by mouth daily.    . citalopram (CELEXA) 40 MG tablet Take 40 mg by mouth daily.    . Cyanocobalamin (RA VITAMIN B-12 TR)  1000 MCG TBCR Take 1,000 mcg by mouth daily.     Marland Kitchen donepezil (ARICEPT) 5 MG tablet Take 5 mg by mouth daily.    . fluticasone (FLONASE) 50 MCG/ACT nasal spray Place into the nose.    Marland Kitchen Fluticasone-Salmeterol (ADVAIR) 250-50 MCG/DOSE AEPB Inhale 1 puff into the lungs daily as needed.     . gabapentin (NEURONTIN) 300 MG capsule 300mg  orally two times a day    . HYDROcodone-acetaminophen (NORCO) 7.5-325 MG tablet 1 tablet by mouth q 6 h prn    . levothyroxine (SYNTHROID, LEVOTHROID) 50 MCG tablet Take 50 mcg by mouth daily before breakfast. Take 30 to 60 minutes before breakfast.    . LORazepam (ATIVAN) 0.5 MG tablet Take 1 tablet (0.5 mg total) by mouth every 8 (eight) hours as needed for anxiety. 30 tablet 0  . montelukast (SINGULAIR) 10 MG tablet Take by mouth.    . Multiple Vitamins-Minerals (HAIR SKIN AND NAILS FORMULA PO) Take 1 tablet by mouth every evening.    . Multiple Vitamins-Minerals (OCUVITE-LUTEIN PO) Take 1 capsule by mouth daily.     Marland Kitchen oxybutynin (DITROPAN) 5 MG tablet Take 5 mg by mouth 2 (two) times daily.     . pantoprazole (PROTONIX) 20 MG tablet Take 20 mg by mouth 2 (two) times daily.     . polycarbophil (FIBERCON) 625 MG tablet Take by mouth.    . pravastatin (PRAVACHOL) 20 MG tablet Take 20 mg by mouth daily.    . traMADol (ULTRAM) 50 MG tablet Take 50 mg by mouth 3 (three) times daily as needed for moderate pain or severe pain. pain    . vitamin C (ASCORBIC ACID) 500 MG tablet Take 500 mg by mouth daily.    . vitamin E (E-400) 400 UNIT capsule Take 400 Units by mouth daily.    Marland Kitchen zolpidem (AMBIEN) 5 MG tablet Take 5 mg by mouth at bedtime.     . memantine (NAMENDA) 5 MG tablet Take 5 mg by mouth 2 (two) times daily.      No current facility-administered medications for this visit.    Facility-Administered Medications Ordered in Other Visits  Medication Dose Route Frequency Provider Last Rate Last Dose  . romiPLOStim (NPLATE) injection 470 mcg  7 mcg/kg Subcutaneous Once  Lloyd Huger, MD        OBJECTIVE: BP 126/66 (BP Location: Left Arm, Patient Position: Sitting)   Pulse (!) 57   Temp 97.8 F (36.6 C) (Tympanic)   Resp 18   Wt 147 lb 8 oz (66.9 kg)   BMI 24.55 kg/m    Body mass index is 24.55 kg/m.    ECOG FS:1 - Symptomatic but completely ambulatory  General: Well-developed, well-nourished, no acute distress. Eyes: Pink conjunctiva, anicteric sclera. HEENT: Normocephalic, moist mucous membranes, clear oropharnyx. Lungs: Clear to auscultation bilaterally. Heart: Regular rate and rhythm. No rubs, murmurs, or gallops. Musculoskeletal: No edema, cyanosis, or clubbing. Neuro: Alert, answering all questions appropriately. Cranial nerves grossly intact.  Skin: No petechiae noted. Bruising noted in various stages of healing. Psych: Normal affect.   LAB RESULTS:  Appointment on 06/08/2017  Component Date Value Ref Range Status  . WBC 06/08/2017 9.2  3.6 - 11.0 K/uL Final  . RBC 06/08/2017 4.03  3.80 - 5.20 MIL/uL Final  . Hemoglobin 06/08/2017 11.2* 12.0 - 16.0 g/dL Final  . HCT 06/08/2017 34.6* 35.0 - 47.0 % Final  . MCV 06/08/2017 85.7  80.0 - 100.0 fL Final  . MCH 06/08/2017 27.7  26.0 - 34.0 pg Final  . MCHC 06/08/2017 32.4  32.0 - 36.0 g/dL Final  . RDW 06/08/2017 16.4* 11.5 - 14.5 % Final  . Platelets 06/08/2017 39* 150 - 440 K/uL Final   Comment: RESULT REPEATED AND VERIFIED PLATELET COUNT CONFIRMED BY SMEAR GIANT PLATELETS SEEN   . Neutrophils Relative % 06/08/2017 54  % Final  . Neutro Abs 06/08/2017 4.9  1.4 - 6.5 K/uL Final  . Lymphocytes Relative 06/08/2017 35  % Final  . Lymphs Abs 06/08/2017 3.2  1.0 - 3.6 K/uL Final  . Monocytes Relative 06/08/2017 8  % Final  . Monocytes Absolute 06/08/2017 0.7  0.2 - 0.9 K/uL Final  . Eosinophils Relative 06/08/2017 2  % Final  . Eosinophils Absolute 06/08/2017 0.2  0 - 0.7 K/uL Final  . Basophils Relative 06/08/2017 1  % Final  . Basophils Absolute 06/08/2017 0.1  0 - 0.1 K/uL  Final    STUDIES: No results found.  ASSESSMENT:  Chronic refractory ITP  PLAN:   1. Chronic refractory ITP: Patient had a poor response to Prednisone, WinRho, and Rituxan. She is status post splenectomy in 2007. Her platelet count is less than 100 at 39, therefore Will proceed with 4 mcg/kg Nplate today. Return to clinic in 4 weeks for laboratory work, further evaluation, and consideration of Nplate if her platelet is below 100. Patient appears to only requiring injections every 3-4 weeks. Can consider Promacta 12.5mg  at a later date if necessary. Max dose of Nplate is 10 mcg/kg. 2. Abdominal pain: Resolved. CT scan did not reveal any distinct etiology. 3. Constipation: Patient does not complain of this today. 4. Weakness and fatigue: Multifactorial. Although patient states she feels improved after receiving her injections in her platelet increase.  Approximately 30 minutes spent in discussion of which greater than 50% was consultation.  Patient expressed understanding and was in agreement with this plan. She also understands that She can call clinic at any time with any questions, concerns, or complaints.    Lloyd Huger, MD   06/08/2017 2:09 PM

## 2017-06-08 ENCOUNTER — Inpatient Hospital Stay: Payer: Medicare HMO

## 2017-06-08 ENCOUNTER — Inpatient Hospital Stay (HOSPITAL_BASED_OUTPATIENT_CLINIC_OR_DEPARTMENT_OTHER): Payer: Medicare HMO | Admitting: Oncology

## 2017-06-08 VITALS — BP 126/66 | HR 57 | Temp 97.8°F | Resp 18 | Wt 147.5 lb

## 2017-06-08 DIAGNOSIS — F418 Other specified anxiety disorders: Secondary | ICD-10-CM | POA: Diagnosis not present

## 2017-06-08 DIAGNOSIS — R531 Weakness: Secondary | ICD-10-CM | POA: Diagnosis not present

## 2017-06-08 DIAGNOSIS — K219 Gastro-esophageal reflux disease without esophagitis: Secondary | ICD-10-CM | POA: Diagnosis not present

## 2017-06-08 DIAGNOSIS — G8929 Other chronic pain: Secondary | ICD-10-CM

## 2017-06-08 DIAGNOSIS — E7800 Pure hypercholesterolemia, unspecified: Secondary | ICD-10-CM | POA: Diagnosis not present

## 2017-06-08 DIAGNOSIS — D693 Immune thrombocytopenic purpura: Secondary | ICD-10-CM

## 2017-06-08 DIAGNOSIS — Z79899 Other long term (current) drug therapy: Secondary | ICD-10-CM | POA: Diagnosis not present

## 2017-06-08 DIAGNOSIS — M1990 Unspecified osteoarthritis, unspecified site: Secondary | ICD-10-CM

## 2017-06-08 DIAGNOSIS — R5383 Other fatigue: Secondary | ICD-10-CM

## 2017-06-08 DIAGNOSIS — R5381 Other malaise: Secondary | ICD-10-CM

## 2017-06-08 DIAGNOSIS — Z86711 Personal history of pulmonary embolism: Secondary | ICD-10-CM | POA: Diagnosis not present

## 2017-06-08 DIAGNOSIS — M549 Dorsalgia, unspecified: Secondary | ICD-10-CM

## 2017-06-08 LAB — CBC WITH DIFFERENTIAL/PLATELET
Basophils Absolute: 0.1 10*3/uL (ref 0–0.1)
Basophils Relative: 1 %
EOS ABS: 0.2 10*3/uL (ref 0–0.7)
EOS PCT: 2 %
HCT: 34.6 % — ABNORMAL LOW (ref 35.0–47.0)
Hemoglobin: 11.2 g/dL — ABNORMAL LOW (ref 12.0–16.0)
LYMPHS ABS: 3.2 10*3/uL (ref 1.0–3.6)
LYMPHS PCT: 35 %
MCH: 27.7 pg (ref 26.0–34.0)
MCHC: 32.4 g/dL (ref 32.0–36.0)
MCV: 85.7 fL (ref 80.0–100.0)
MONO ABS: 0.7 10*3/uL (ref 0.2–0.9)
MONOS PCT: 8 %
Neutro Abs: 4.9 10*3/uL (ref 1.4–6.5)
Neutrophils Relative %: 54 %
PLATELETS: 39 10*3/uL — AB (ref 150–440)
RBC: 4.03 MIL/uL (ref 3.80–5.20)
RDW: 16.4 % — ABNORMAL HIGH (ref 11.5–14.5)
WBC: 9.2 10*3/uL (ref 3.6–11.0)

## 2017-06-08 MED ORDER — ROMIPLOSTIM INJECTION 500 MCG
7.0000 ug/kg | Freq: Once | SUBCUTANEOUS | Status: AC
  Administered 2017-06-08: 470 ug via SUBCUTANEOUS
  Filled 2017-06-08: qty 0.94

## 2017-06-17 DIAGNOSIS — R51 Headache: Secondary | ICD-10-CM | POA: Diagnosis not present

## 2017-06-17 DIAGNOSIS — J4530 Mild persistent asthma, uncomplicated: Secondary | ICD-10-CM | POA: Diagnosis not present

## 2017-06-17 DIAGNOSIS — G301 Alzheimer's disease with late onset: Secondary | ICD-10-CM | POA: Diagnosis not present

## 2017-06-17 DIAGNOSIS — F329 Major depressive disorder, single episode, unspecified: Secondary | ICD-10-CM | POA: Diagnosis not present

## 2017-06-17 DIAGNOSIS — E039 Hypothyroidism, unspecified: Secondary | ICD-10-CM | POA: Diagnosis not present

## 2017-06-17 DIAGNOSIS — F0280 Dementia in other diseases classified elsewhere without behavioral disturbance: Secondary | ICD-10-CM | POA: Diagnosis not present

## 2017-06-17 DIAGNOSIS — F419 Anxiety disorder, unspecified: Secondary | ICD-10-CM | POA: Diagnosis not present

## 2017-06-17 DIAGNOSIS — R2689 Other abnormalities of gait and mobility: Secondary | ICD-10-CM | POA: Diagnosis not present

## 2017-06-17 DIAGNOSIS — D473 Essential (hemorrhagic) thrombocythemia: Secondary | ICD-10-CM | POA: Diagnosis not present

## 2017-06-17 DIAGNOSIS — I10 Essential (primary) hypertension: Secondary | ICD-10-CM | POA: Diagnosis not present

## 2017-06-17 DIAGNOSIS — F5104 Psychophysiologic insomnia: Secondary | ICD-10-CM | POA: Diagnosis not present

## 2017-07-05 NOTE — Progress Notes (Signed)
[place]  [phone]  [phone]     Ariana [name] DOB: 12-04-1932  MR#: [id number]  BTD#:[id number]  Patient Care Team: Idelle Crouch, MD as PCP - General (Internal Medicine)  CHIEF COMPLAINT: Chronic refractory ITP  INTERVAL HISTORY:  Patient returns to clinic today for repeat laboratory work and consideration of additional Nplate. She had some hemoptysis earlier this morning but otherwise has felt well. She does not complain of easy bruising. She has no neurologic complaints. She denies any fevers. She has no chest pain or shortness of breath. She denies any nausea, vomiting, constipation, or diarrhea. She has no urinary complaints. Patient offers no further specific complaints today.  REVIEW OF SYSTEMS:   Review of Systems  Constitutional: Positive for malaise/fatigue. Negative for fever and weight loss.  HENT: Negative for congestion.   Respiratory: Positive for hemoptysis. Negative for cough and shortness of breath.   Cardiovascular: Negative.  Negative for chest pain and leg swelling.  Gastrointestinal: Negative for abdominal pain, blood in stool, diarrhea, melena and nausea.  Genitourinary: Negative.  Negative for dysuria and hematuria.  Musculoskeletal: Negative.   Skin: Negative.  Negative for rash.  Neurological: Positive for weakness. Negative for sensory change.  Endo/Heme/Allergies: Does not bruise/bleed easily.  Psychiatric/Behavioral: Negative.  The patient is not nervous/anxious.     As per HPI. Otherwise, a complete review of systems is negative.  PAST MEDICAL HISTORY: Past Medical History:  Diagnosis Date  . Anemia   . Anxiety   . Asthma   . Chronic back pain   . Depression   . Depression   . GERD (gastroesophageal reflux disease)   . Hiatal hernia   . Hypercholesteremia   . ITP (idiopathic thrombocytopenic purpura)   . Osteoarthritis   . Osteoporosis   . Pulmonary emboli ([place])   . Restless legs       PAST SURGICAL HISTORY: Past Surgical History:  Procedure Laterality Date  . CARPAL TUNNEL RELEASE    . ESOPHAGOGASTRODUODENOSCOPY (EGD) WITH PROPOFOL N/A 09/10/2015   Procedure: ESOPHAGOGASTRODUODENOSCOPY (EGD) WITH PROPOFOL;  Surgeon: Lollie Sails, MD;  Location: [facility] ENDOSCOPY;  Service: Endoscopy;  Laterality: N/A;  . LUMBAR [place]    . SPLENECTOMY, PARTIAL    . TOTAL ABDOMINAL HYSTERECTOMY      FAMILY HISTORY Family History  Problem Relation Age of Onset  . Stroke Mother     GYNECOLOGIC HISTORY:  No LMP recorded. Patient is postmenopausal.     ADVANCED DIRECTIVES:    HEALTH MAINTENANCE: Social History  Substance Use Topics  . Smoking status: Never Smoker  . Smokeless tobacco: Never Used  . Alcohol use No     Allergies  Allergen Reactions  . Aspirin     Other reaction(s): Distress (finding)  . Diazepam     Other reaction(s): Itching of Skin sneezing  . Morphine     Other reaction(s): Itching of Skin  . Other     Other reaction(s): Unknown seasonal allergies  . Tetanus Toxoids     Other reaction(s): Localized superficial swelling of skin    Current Outpatient Prescriptions  Medication Sig Dispense Refill  . acetaminophen (TYLENOL) 325 MG tablet Take 2 tablets (650 mg total) by mouth every 6 (six) hours as needed for mild pain (or Fever >/= 101). 100 tablet 0  . albuterol (PROVENTIL HFA;VENTOLIN HFA) 108 (90 BASE) MCG/ACT inhaler Inhale 2 puffs into the lungs every 6 (six) hours as needed for wheezing or shortness of breath.    Marland Kitchen  ALPRAZolam (XANAX) 0.5 MG tablet Take by mouth.    Marland Kitchen buPROPion (WELLBUTRIN XL) 150 MG 24 hr tablet Take 1 tablet by mouth daily.    . busPIRone (BUSPAR) 7.5 MG tablet Take 7.5 mg by mouth 2 (two) times daily.    . Calcium Carbonate-Vitamin D (CALCIUM 600+D) 600-400 MG-UNIT per tablet Take 1 tablet by mouth daily.    . citalopram (CELEXA) 40 MG tablet Take 40 mg by mouth daily.    . Cyanocobalamin (RA VITAMIN B-12 TR)  1000 MCG TBCR Take 1,000 mcg by mouth daily.     Marland Kitchen donepezil (ARICEPT) 5 MG tablet Take 5 mg by mouth daily.    . fluticasone (FLONASE) 50 MCG/ACT nasal spray Place into the nose.    Marland Kitchen Fluticasone-Salmeterol (ADVAIR) 250-50 MCG/DOSE AEPB Inhale 1 puff into the lungs daily as needed.     . gabapentin (NEURONTIN) 300 MG capsule 300mg  orally two times a day    . HYDROcodone-acetaminophen (NORCO) 7.5-325 MG tablet 1 tablet by mouth q 6 h prn    . levothyroxine (SYNTHROID, LEVOTHROID) 50 MCG tablet Take 50 mcg by mouth daily before breakfast. Take 30 to 60 minutes before breakfast.    . LORazepam (ATIVAN) 0.5 MG tablet Take 1 tablet (0.5 mg total) by mouth every 8 (eight) hours as needed for anxiety. 30 tablet 0  . montelukast (SINGULAIR) 10 MG tablet Take by mouth.    . Multiple Vitamins-Minerals (HAIR SKIN AND NAILS FORMULA PO) Take 1 tablet by mouth every evening.    . Multiple Vitamins-Minerals (OCUVITE-LUTEIN PO) Take 1 capsule by mouth daily.     Marland Kitchen oxybutynin (DITROPAN) 5 MG tablet Take 5 mg by mouth 2 (two) times daily.     . pantoprazole (PROTONIX) 20 MG tablet Take 20 mg by mouth 2 (two) times daily.     . polycarbophil (FIBERCON) 625 MG tablet Take by mouth.    . pravastatin (PRAVACHOL) 20 MG tablet Take 20 mg by mouth daily.    . traMADol (ULTRAM) 50 MG tablet Take 50 mg by mouth 3 (three) times daily as needed for moderate pain or severe pain. pain    . vitamin C (ASCORBIC ACID) 500 MG tablet Take 500 mg by mouth daily.    . vitamin E (E-400) 400 UNIT capsule Take 400 Units by mouth daily.    Marland Kitchen zolpidem (AMBIEN) 5 MG tablet Take 5 mg by mouth at bedtime.     . memantine (NAMENDA) 5 MG tablet Take 5 mg by mouth 2 (two) times daily.      No current facility-administered medications for this visit.     OBJECTIVE: BP 133/61   Pulse (!) 58   Temp 98.1 F (36.7 C) (Tympanic)   Resp 20   Wt 140 lb 3.2 oz (63.6 kg)   BMI 23.33 kg/m    Body mass index is 23.33 kg/m.    ECOG FS:1 -  Symptomatic but completely ambulatory  General: Well-developed, well-nourished, no acute distress. Eyes: Pink conjunctiva, anicteric sclera. HEENT: Normocephalic, moist mucous membranes, clear oropharnyx. Lungs: Clear to auscultation bilaterally. Heart: Regular rate and rhythm. No rubs, murmurs, or gallops. Musculoskeletal: No edema, cyanosis, or clubbing. Neuro: Alert, answering all questions appropriately. Cranial nerves grossly intact. Skin: No petechiae noted. Bruising noted in various stages of healing. Psych: Normal affect.   LAB RESULTS:  Appointment on 07/06/2017  Component Date Value Ref Range Status  . WBC 07/06/2017 8.4  3.6 - 11.0 K/uL Final  . RBC 07/06/2017 4.23  3.80 - 5.20 MIL/uL Final  . Hemoglobin 07/06/2017 11.8* 12.0 - 16.0 g/dL Final  . HCT 07/06/2017 36.1  35.0 - 47.0 % Final  . MCV 07/06/2017 85.1  80.0 - 100.0 fL Final  . MCH 07/06/2017 27.8  26.0 - 34.0 pg Final  . MCHC 07/06/2017 32.7  32.0 - 36.0 g/dL Final  . RDW 07/06/2017 17.0* 11.5 - 14.5 % Final  . Platelets 07/06/2017 50* 150 - 400 K/uL Final   Comment: PLATELET COUNT CONFIRMED BY SMEAR PLATELETS APPEAR DECREASED GIANT PLATELETS SEEN   . Neutrophils Relative % 07/06/2017 56  % Final  . Neutro Abs 07/06/2017 4.7  1.4 - 6.5 K/uL Final  . Lymphocytes Relative 07/06/2017 32  % Final  . Lymphs Abs 07/06/2017 2.7  1.0 - 3.6 K/uL Final  . Monocytes Relative 07/06/2017 9  % Final  . Monocytes Absolute 07/06/2017 0.8  0.2 - 0.9 K/uL Final  . Eosinophils Relative 07/06/2017 1  % Final  . Eosinophils Absolute 07/06/2017 0.1  0 - 0.7 K/uL Final  . Basophils Relative 07/06/2017 2  % Final  . Basophils Absolute 07/06/2017 0.2* 0 - 0.1 K/uL Final    STUDIES: No results found.  ASSESSMENT:  Chronic refractory ITP  PLAN:   1. Chronic refractory ITP: Patient had a poor response to Prednisone, WinRho, and Rituxan. She is status post splenectomy in 2007. Her platelet count is less than 100, therefore will  proceed with 7 mcg/kg Nplate today. Return to clinic in 4 weeks for laboratory work, further evaluation, and consideration of Nplate if her platelet is below 100. Patient appears to only requiring injections every 3-4 weeks. Can consider Promacta 12.5mg  at a later date if necessary. Max dose of Nplate is 10 mcg/kg. 2. Abdominal pain: Resolved. CT scan did not reveal any distinct etiology. 3. Constipation: Patient does not complain of this today. 4. Weakness and fatigue: Multifactorial. Although patient states she feels improved after receiving her injections in her platelet increase. 5. Hemoptysis: Unclear etiology. Possibly related to thrombocytopenia. Consider pulmonary referral and the future if persists.  Approximately 30 minutes spent in discussion of which greater than 50% was consultation.  Patient expressed understanding and was in agreement with this plan. She also understands that She can call clinic at any time with any questions, concerns, or complaints.    Lloyd Huger, MD   07/09/2017 2:28 PM

## 2017-07-06 ENCOUNTER — Inpatient Hospital Stay: Payer: Medicare HMO

## 2017-07-06 ENCOUNTER — Inpatient Hospital Stay: Payer: Medicare HMO | Attending: Oncology | Admitting: Oncology

## 2017-07-06 VITALS — BP 133/61 | HR 58 | Temp 98.1°F | Resp 20 | Wt 140.2 lb

## 2017-07-06 DIAGNOSIS — E531 Pyridoxine deficiency: Secondary | ICD-10-CM

## 2017-07-06 DIAGNOSIS — R5381 Other malaise: Secondary | ICD-10-CM

## 2017-07-06 DIAGNOSIS — K219 Gastro-esophageal reflux disease without esophagitis: Secondary | ICD-10-CM | POA: Diagnosis not present

## 2017-07-06 DIAGNOSIS — R042 Hemoptysis: Secondary | ICD-10-CM | POA: Diagnosis not present

## 2017-07-06 DIAGNOSIS — R531 Weakness: Secondary | ICD-10-CM | POA: Diagnosis not present

## 2017-07-06 DIAGNOSIS — Z86711 Personal history of pulmonary embolism: Secondary | ICD-10-CM | POA: Diagnosis not present

## 2017-07-06 DIAGNOSIS — M1990 Unspecified osteoarthritis, unspecified site: Secondary | ICD-10-CM | POA: Diagnosis not present

## 2017-07-06 DIAGNOSIS — Z79899 Other long term (current) drug therapy: Secondary | ICD-10-CM | POA: Insufficient documentation

## 2017-07-06 DIAGNOSIS — D693 Immune thrombocytopenic purpura: Secondary | ICD-10-CM

## 2017-07-06 DIAGNOSIS — M810 Age-related osteoporosis without current pathological fracture: Secondary | ICD-10-CM | POA: Insufficient documentation

## 2017-07-06 DIAGNOSIS — R5383 Other fatigue: Secondary | ICD-10-CM | POA: Diagnosis not present

## 2017-07-06 DIAGNOSIS — Z780 Asymptomatic menopausal state: Secondary | ICD-10-CM | POA: Diagnosis not present

## 2017-07-06 DIAGNOSIS — K5900 Constipation, unspecified: Secondary | ICD-10-CM | POA: Diagnosis not present

## 2017-07-06 DIAGNOSIS — E7800 Pure hypercholesterolemia, unspecified: Secondary | ICD-10-CM

## 2017-07-06 LAB — CBC WITH DIFFERENTIAL/PLATELET
BASOS ABS: 0.2 10*3/uL — AB (ref 0–0.1)
BASOS PCT: 2 %
EOS PCT: 1 %
Eosinophils Absolute: 0.1 10*3/uL (ref 0–0.7)
HCT: 36.1 % (ref 35.0–47.0)
Hemoglobin: 11.8 g/dL — ABNORMAL LOW (ref 12.0–16.0)
Lymphocytes Relative: 32 %
Lymphs Abs: 2.7 10*3/uL (ref 1.0–3.6)
MCH: 27.8 pg (ref 26.0–34.0)
MCHC: 32.7 g/dL (ref 32.0–36.0)
MCV: 85.1 fL (ref 80.0–100.0)
Monocytes Absolute: 0.8 10*3/uL (ref 0.2–0.9)
Monocytes Relative: 9 %
NEUTROS PCT: 56 %
Neutro Abs: 4.7 10*3/uL (ref 1.4–6.5)
PLATELETS: 50 10*3/uL — AB (ref 150–400)
RBC: 4.23 MIL/uL (ref 3.80–5.20)
RDW: 17 % — ABNORMAL HIGH (ref 11.5–14.5)
WBC: 8.4 10*3/uL (ref 3.6–11.0)

## 2017-07-06 MED ORDER — ROMIPLOSTIM INJECTION 500 MCG
7.0000 ug/kg | Freq: Once | SUBCUTANEOUS | Status: AC
  Administered 2017-07-06: 445 ug via SUBCUTANEOUS
  Filled 2017-07-06: qty 0.89

## 2017-07-06 NOTE — Progress Notes (Signed)
Patient reports she vomited blood x 1 this morning, denies pain today.

## 2017-07-06 NOTE — Progress Notes (Signed)
Plt =50,  Verified dose with MD, patient to get 6mcg/kg today

## 2017-07-19 DIAGNOSIS — M62830 Muscle spasm of back: Secondary | ICD-10-CM | POA: Diagnosis not present

## 2017-07-19 DIAGNOSIS — D696 Thrombocytopenia, unspecified: Secondary | ICD-10-CM | POA: Diagnosis not present

## 2017-07-19 DIAGNOSIS — M5136 Other intervertebral disc degeneration, lumbar region: Secondary | ICD-10-CM | POA: Diagnosis not present

## 2017-07-19 DIAGNOSIS — M5416 Radiculopathy, lumbar region: Secondary | ICD-10-CM | POA: Diagnosis not present

## 2017-07-26 DIAGNOSIS — F419 Anxiety disorder, unspecified: Secondary | ICD-10-CM | POA: Diagnosis not present

## 2017-07-26 DIAGNOSIS — G301 Alzheimer's disease with late onset: Secondary | ICD-10-CM | POA: Diagnosis not present

## 2017-07-26 DIAGNOSIS — R2689 Other abnormalities of gait and mobility: Secondary | ICD-10-CM | POA: Diagnosis not present

## 2017-07-26 DIAGNOSIS — F329 Major depressive disorder, single episode, unspecified: Secondary | ICD-10-CM | POA: Diagnosis not present

## 2017-07-26 DIAGNOSIS — M5416 Radiculopathy, lumbar region: Secondary | ICD-10-CM | POA: Diagnosis not present

## 2017-07-26 DIAGNOSIS — F5104 Psychophysiologic insomnia: Secondary | ICD-10-CM | POA: Diagnosis not present

## 2017-07-26 DIAGNOSIS — F0280 Dementia in other diseases classified elsewhere without behavioral disturbance: Secondary | ICD-10-CM | POA: Diagnosis not present

## 2017-07-31 NOTE — Progress Notes (Signed)
[place]  [phone]  Fax:([phone]     Ariana [name] DOB: 1932/12/19  MR#: [id number]  ELF#:[id number]  Patient Care Team: Idelle Crouch, MD as PCP - General (Internal Medicine)  CHIEF COMPLAINT: Chronic refractory ITP  INTERVAL HISTORY:  Patient returns to clinic today for repeat laboratory work and consideration of additional Nplate. She admits to increased depression secondary to worsening memory and concern of developing dementia. She continues to have easy bruising, but denies any bleeding. She has no neurologic complaints. She denies any fevers. She has no chest pain or shortness of breath. She denies any nausea, vomiting, constipation, or diarrhea. She has no urinary complaints. Patient offers no further specific complaints today.  REVIEW OF SYSTEMS:   Review of Systems  Constitutional: Positive for malaise/fatigue. Negative for fever and weight loss.  HENT: Negative for congestion.   Respiratory: Negative.  Negative for cough, hemoptysis and shortness of breath.   Cardiovascular: Negative.  Negative for chest pain and leg swelling.  Gastrointestinal: Negative for abdominal pain, blood in stool, diarrhea, melena and nausea.  Genitourinary: Negative.  Negative for dysuria and hematuria.  Musculoskeletal: Negative.   Skin: Negative.  Negative for rash.  Neurological: Positive for weakness. Negative for sensory change.  Endo/Heme/Allergies: Bruises/bleeds easily.  Psychiatric/Behavioral: Positive for depression and memory loss. The patient is not nervous/anxious.     As per HPI. Otherwise, a complete review of systems is negative.  PAST MEDICAL HISTORY: Past Medical History:  Diagnosis Date  . Anemia   . Anxiety   . Asthma   . Chronic back pain   . Depression   . Depression   . GERD (gastroesophageal reflux disease)   . Hiatal hernia   . Hypercholesteremia   . ITP (idiopathic thrombocytopenic purpura)   .  Osteoarthritis   . Osteoporosis   . Pulmonary emboli ([place])   . Restless legs     PAST SURGICAL HISTORY: Past Surgical History:  Procedure Laterality Date  . CARPAL TUNNEL RELEASE    . ESOPHAGOGASTRODUODENOSCOPY (EGD) WITH PROPOFOL N/A 09/10/2015   Procedure: ESOPHAGOGASTRODUODENOSCOPY (EGD) WITH PROPOFOL;  Surgeon: Lollie Sails, MD;  Location: [facility] ENDOSCOPY;  Service: Endoscopy;  Laterality: N/A;  . LUMBAR [place]    . SPLENECTOMY, PARTIAL    . TOTAL ABDOMINAL HYSTERECTOMY      FAMILY HISTORY Family History  Problem Relation Age of Onset  . Stroke Mother     GYNECOLOGIC HISTORY:  No LMP recorded. Patient is postmenopausal.     ADVANCED DIRECTIVES:    HEALTH MAINTENANCE: Social History  Substance Use Topics  . Smoking status: Never Smoker  . Smokeless tobacco: Never Used  . Alcohol use No     Allergies  Allergen Reactions  . Aspirin     Other reaction(s): Distress (finding)  . Diazepam     Other reaction(s): Itching of Skin sneezing  . Morphine     Other reaction(s): Itching of Skin  . Other     Other reaction(s): Unknown seasonal allergies  . Tetanus Toxoids     Other reaction(s): Localized superficial swelling of skin    Current Outpatient Prescriptions  Medication Sig Dispense Refill  . acetaminophen (TYLENOL) 325 MG tablet Take 2 tablets (650 mg total) by mouth every 6 (six) hours as needed for mild pain (or Fever >/= 101). 100 tablet 0  . albuterol (PROVENTIL HFA;VENTOLIN HFA) 108 (90 BASE) MCG/ACT inhaler Inhale 2 puffs into the lungs every 6 (six) hours as needed for wheezing  or shortness of breath.    . ALPRAZolam (XANAX) 0.5 MG tablet Take by mouth.    . busPIRone (BUSPAR) 7.5 MG tablet Take 7.5 mg by mouth 2 (two) times daily.    . Calcium Carbonate-Vitamin D (CALCIUM 600+D) 600-400 MG-UNIT per tablet Take 1 tablet by mouth daily.    . citalopram (CELEXA) 40 MG tablet Take 40 mg by mouth daily.    . Cyanocobalamin (RA VITAMIN B-12 TR)  1000 MCG TBCR Take 1,000 mcg by mouth daily.     Marland Kitchen donepezil (ARICEPT) 5 MG tablet Take 5 mg by mouth daily.    . fluticasone (FLONASE) 50 MCG/ACT nasal spray Place into the nose.    Marland Kitchen Fluticasone-Salmeterol (ADVAIR) 250-50 MCG/DOSE AEPB Inhale 1 puff into the lungs daily as needed.     . gabapentin (NEURONTIN) 300 MG capsule 300mg  orally two times a day    . HYDROcodone-acetaminophen (NORCO) 7.5-325 MG tablet 1 tablet by mouth q 6 h prn    . levothyroxine (SYNTHROID, LEVOTHROID) 50 MCG tablet Take 50 mcg by mouth daily before breakfast. Take 30 to 60 minutes before breakfast.    . LORazepam (ATIVAN) 0.5 MG tablet Take 1 tablet (0.5 mg total) by mouth every 8 (eight) hours as needed for anxiety. 30 tablet 0  . montelukast (SINGULAIR) 10 MG tablet Take by mouth.    . Multiple Vitamins-Minerals (HAIR SKIN AND NAILS FORMULA PO) Take 1 tablet by mouth every evening.    . Multiple Vitamins-Minerals (OCUVITE-LUTEIN PO) Take 1 capsule by mouth daily.     Marland Kitchen oxybutynin (DITROPAN) 5 MG tablet Take 5 mg by mouth 2 (two) times daily.     . pantoprazole (PROTONIX) 20 MG tablet Take 20 mg by mouth 2 (two) times daily.     . polycarbophil (FIBERCON) 625 MG tablet Take by mouth.    . pravastatin (PRAVACHOL) 20 MG tablet Take 20 mg by mouth daily.    . traMADol (ULTRAM) 50 MG tablet Take 50 mg by mouth 3 (three) times daily as needed for moderate pain or severe pain. pain    . vitamin C (ASCORBIC ACID) 500 MG tablet Take 500 mg by mouth daily.    . vitamin E (E-400) 400 UNIT capsule Take 400 Units by mouth daily.    Marland Kitchen zolpidem (AMBIEN) 5 MG tablet Take 5 mg by mouth at bedtime.     . memantine (NAMENDA) 5 MG tablet Take 5 mg by mouth 2 (two) times daily.      No current facility-administered medications for this visit.     OBJECTIVE: BP (!) 145/63   Pulse (!) 59   Temp (!) 96.2 F (35.7 C) (Tympanic)   Resp 20   Wt 134 lb 12.8 oz (61.1 kg)   BMI 22.43 kg/m    Body mass index is 22.43 kg/m.    ECOG  FS:1 - Symptomatic but completely ambulatory  General: Well-developed, well-nourished, no acute distress. Eyes: Pink conjunctiva, anicteric sclera. HEENT: Normocephalic, moist mucous membranes, clear oropharnyx. Lungs: Clear to auscultation bilaterally. Heart: Regular rate and rhythm. No rubs, murmurs, or gallops. Musculoskeletal: No edema, cyanosis, or clubbing. Neuro: Alert, answering all questions appropriately. Cranial nerves grossly intact. Skin: No petechiae noted. Bruising noted in various stages of healing. Psych: Normal affect.   LAB RESULTS:  Appointment on 08/03/2017  Component Date Value Ref Range Status  . WBC 08/03/2017 14.3* 3.6 - 11.0 K/uL Final  . RBC 08/03/2017 4.50  3.80 - 5.20 MIL/uL Final  . Hemoglobin  08/03/2017 12.2  12.0 - 16.0 g/dL Final  . HCT 08/03/2017 38.6  35.0 - 47.0 % Final  . MCV 08/03/2017 85.9  80.0 - 100.0 fL Final  . MCH 08/03/2017 27.2  26.0 - 34.0 pg Final  . MCHC 08/03/2017 31.7* 32.0 - 36.0 g/dL Final  . RDW 08/03/2017 18.0* 11.5 - 14.5 % Final  . Platelets 08/03/2017 40* 150 - 400 K/uL Final   Comment: PLATELET COUNT PERFORMED ON CITRATED BLOOD PLATELET COUNT CONFIRMED BY SMEAR GIANT PLATELETS SEEN   . Neutrophils Relative % 08/03/2017 64  % Final  . Neutro Abs 08/03/2017 9.1* 1.4 - 6.5 K/uL Final  . Lymphocytes Relative 08/03/2017 27  % Final  . Lymphs Abs 08/03/2017 3.8* 1.0 - 3.6 K/uL Final  . Monocytes Relative 08/03/2017 7  % Final  . Monocytes Absolute 08/03/2017 1.0* 0.2 - 0.9 K/uL Final  . Eosinophils Relative 08/03/2017 1  % Final  . Eosinophils Absolute 08/03/2017 0.1  0 - 0.7 K/uL Final  . Basophils Relative 08/03/2017 1  % Final  . Basophils Absolute 08/03/2017 0.2* 0 - 0.1 K/uL Final    STUDIES: No results found.  ASSESSMENT:  Chronic refractory ITP  PLAN:   1. Chronic refractory ITP: Patient had a poor response to Prednisone, WinRho, and Rituxan. She is status post splenectomy in 2007. Her platelet count is less  than 100, therefore will proceed with 7 mcg/kg Nplate today. Return to clinic in 4 weeks for laboratory work, further evaluation, and consideration of Nplate if her platelet is below 100. Patient appears to only requiring injections every 3-4 weeks. Can consider Promacta 12.5mg  at a later date if necessary. Max dose of Nplate is 10 mcg/kg. 2. Abdominal pain: Resolved. CT scan did not reveal any distinct etiology. 3. Constipation: Patient does not complain of this today. 4. Weakness and fatigue: Multifactorial. Although patient states she feels improved after receiving her injections in her platelet increase. 5. Depression/dementia: Monitor. Continue follow-up with primary care.  Approximately 30 minutes spent in discussion of which greater than 50% was consultation.  Patient expressed understanding and was in agreement with this plan. She also understands that She can call clinic at any time with any questions, concerns, or complaints.    Lloyd Huger, MD   08/03/2017 4:38 PM

## 2017-08-03 ENCOUNTER — Inpatient Hospital Stay: Payer: Medicare HMO

## 2017-08-03 ENCOUNTER — Inpatient Hospital Stay: Payer: Medicare HMO | Attending: Oncology

## 2017-08-03 ENCOUNTER — Inpatient Hospital Stay (HOSPITAL_BASED_OUTPATIENT_CLINIC_OR_DEPARTMENT_OTHER): Payer: Medicare HMO | Admitting: Oncology

## 2017-08-03 VITALS — BP 145/63 | HR 59 | Temp 96.2°F | Resp 20 | Wt 134.8 lb

## 2017-08-03 DIAGNOSIS — R5381 Other malaise: Secondary | ICD-10-CM

## 2017-08-03 DIAGNOSIS — R531 Weakness: Secondary | ICD-10-CM

## 2017-08-03 DIAGNOSIS — R5383 Other fatigue: Secondary | ICD-10-CM | POA: Diagnosis not present

## 2017-08-03 DIAGNOSIS — M1990 Unspecified osteoarthritis, unspecified site: Secondary | ICD-10-CM

## 2017-08-03 DIAGNOSIS — R413 Other amnesia: Secondary | ICD-10-CM | POA: Insufficient documentation

## 2017-08-03 DIAGNOSIS — E7800 Pure hypercholesterolemia, unspecified: Secondary | ICD-10-CM | POA: Diagnosis not present

## 2017-08-03 DIAGNOSIS — F418 Other specified anxiety disorders: Secondary | ICD-10-CM

## 2017-08-03 DIAGNOSIS — G8929 Other chronic pain: Secondary | ICD-10-CM

## 2017-08-03 DIAGNOSIS — M810 Age-related osteoporosis without current pathological fracture: Secondary | ICD-10-CM | POA: Diagnosis not present

## 2017-08-03 DIAGNOSIS — Z86711 Personal history of pulmonary embolism: Secondary | ICD-10-CM | POA: Insufficient documentation

## 2017-08-03 DIAGNOSIS — Z9081 Acquired absence of spleen: Secondary | ICD-10-CM | POA: Diagnosis not present

## 2017-08-03 DIAGNOSIS — M549 Dorsalgia, unspecified: Secondary | ICD-10-CM

## 2017-08-03 DIAGNOSIS — Z79899 Other long term (current) drug therapy: Secondary | ICD-10-CM | POA: Insufficient documentation

## 2017-08-03 DIAGNOSIS — D693 Immune thrombocytopenic purpura: Secondary | ICD-10-CM | POA: Diagnosis not present

## 2017-08-03 DIAGNOSIS — K219 Gastro-esophageal reflux disease without esophagitis: Secondary | ICD-10-CM

## 2017-08-03 LAB — CBC WITH DIFFERENTIAL/PLATELET
BASOS ABS: 0.2 10*3/uL — AB (ref 0–0.1)
Basophils Relative: 1 %
EOS ABS: 0.1 10*3/uL (ref 0–0.7)
EOS PCT: 1 %
HCT: 38.6 % (ref 35.0–47.0)
HEMOGLOBIN: 12.2 g/dL (ref 12.0–16.0)
LYMPHS ABS: 3.8 10*3/uL — AB (ref 1.0–3.6)
Lymphocytes Relative: 27 %
MCH: 27.2 pg (ref 26.0–34.0)
MCHC: 31.7 g/dL — ABNORMAL LOW (ref 32.0–36.0)
MCV: 85.9 fL (ref 80.0–100.0)
Monocytes Absolute: 1 10*3/uL — ABNORMAL HIGH (ref 0.2–0.9)
Monocytes Relative: 7 %
NEUTROS PCT: 64 %
Neutro Abs: 9.1 10*3/uL — ABNORMAL HIGH (ref 1.4–6.5)
PLATELETS: 40 10*3/uL — AB (ref 150–400)
RBC: 4.5 MIL/uL (ref 3.80–5.20)
RDW: 18 % — ABNORMAL HIGH (ref 11.5–14.5)
WBC: 14.3 10*3/uL — ABNORMAL HIGH (ref 3.6–11.0)

## 2017-08-03 MED ORDER — ROMIPLOSTIM INJECTION 500 MCG
7.0000 ug/kg | Freq: Once | SUBCUTANEOUS | Status: AC
  Administered 2017-08-03: 430 ug via SUBCUTANEOUS
  Filled 2017-08-03: qty 0.86

## 2017-08-03 NOTE — Progress Notes (Signed)
Patient denies any concerns today.  

## 2017-08-30 NOTE — Progress Notes (Signed)
[place]  [phone]  Fax:([phone]) [phone]     Ariana [name] DOB: [birth year]  MR#: [id number]  EQA#:[id number]  Patient Care Team: Idelle Crouch, MD as PCP - General (Internal Medicine)  CHIEF COMPLAINT: Chronic refractory ITP  INTERVAL HISTORY:  Patient returns to clinic today for repeat laboratory work and consideration of additional Nplate. Her son states that yesterday morning at 3 am she fell trying to go to the bathroom. She missed the toilet and lodged herself between the toilet and bathtub. He states she has multiple skin tears, a hematoma or right calf/ankle and is black and blue all over her extremities. Her husband had trouble stopping the bleeding but was able to apply pressure bandages with relief. Additionally she is complaining of urinary hesitancy, frequency and pain. This started two days ago. She denies any fevers or chills. She continues to have depression per son due to progressing dementia. She has no neurologic complaints. She has no chest pain or shortness of breath. She denies any nausea, vomiting, constipation, or diarrhea.   REVIEW OF SYSTEMS:   Review of Systems  Constitutional: Positive for malaise/fatigue. Negative for chills, fever and weight loss.  HENT: Negative for congestion.   Respiratory: Negative.  Negative for cough, hemoptysis and shortness of breath.   Cardiovascular: Negative.  Negative for chest pain and leg swelling.  Gastrointestinal: Negative for abdominal pain, blood in stool, diarrhea, melena and nausea.  Genitourinary: Positive for dysuria, frequency and urgency. Negative for hematuria.  Musculoskeletal: Negative.   Skin: Negative.  Negative for rash.  Neurological: Positive for weakness. Negative for sensory change.  Endo/Heme/Allergies: Bruises/bleeds easily.  Psychiatric/Behavioral: Positive for depression and memory loss. The patient is not nervous/anxious.     As per HPI. Otherwise, a  complete review of systems is negative.  PAST MEDICAL HISTORY: Past Medical History:  Diagnosis Date  . Anemia   . Anxiety   . Asthma   . Chronic back pain   . Depression   . Depression   . GERD (gastroesophageal reflux disease)   . Hiatal hernia   . Hypercholesteremia   . ITP (idiopathic thrombocytopenic purpura)   . Osteoarthritis   . Osteoporosis   . Pulmonary emboli ([place])   . Restless legs     PAST SURGICAL HISTORY: Past Surgical History:  Procedure Laterality Date  . CARPAL TUNNEL RELEASE    . ESOPHAGOGASTRODUODENOSCOPY (EGD) WITH PROPOFOL N/A 09/10/2015   Procedure: ESOPHAGOGASTRODUODENOSCOPY (EGD) WITH PROPOFOL;  Surgeon: Lollie Sails, MD;  Location: [facility] ENDOSCOPY;  Service: Endoscopy;  Laterality: N/A;  . LUMBAR [place]    . SPLENECTOMY, PARTIAL    . TOTAL ABDOMINAL HYSTERECTOMY      FAMILY HISTORY Family History  Problem Relation Age of Onset  . Stroke Mother     GYNECOLOGIC HISTORY:  No LMP recorded. Patient is postmenopausal.     ADVANCED DIRECTIVES:    HEALTH MAINTENANCE: Social History   Tobacco Use  . Smoking status: Never Smoker  . Smokeless tobacco: Never Used  Substance Use Topics  . Alcohol use: No    Alcohol/week: 0.0 oz  . Drug use: No     Allergies  Allergen Reactions  . Aspirin     Other reaction(s): Distress (finding)  . Diazepam     Other reaction(s): Itching of Skin sneezing  . Morphine     Other reaction(s): Itching of Skin  . Other     Other reaction(s): Unknown seasonal allergies  . Tetanus Toxoids  Other reaction(s): Localized superficial swelling of skin    Current Outpatient Medications  Medication Sig Dispense Refill  . acetaminophen (TYLENOL) 325 MG tablet Take 2 tablets (650 mg total) by mouth every 6 (six) hours as needed for mild pain (or Fever >/= 101). 100 tablet 0  . albuterol (PROVENTIL HFA;VENTOLIN HFA) 108 (90 BASE) MCG/ACT inhaler Inhale 2 puffs into the lungs every 6 (six) hours as  needed for wheezing or shortness of breath.    . ALPRAZolam (XANAX) 0.5 MG tablet Take by mouth.    . Calcium Carbonate-Vitamin D (CALCIUM 600+D) 600-400 MG-UNIT per tablet Take 1 tablet by mouth daily.    . citalopram (CELEXA) 40 MG tablet Take 40 mg by mouth daily.    . Cyanocobalamin (RA VITAMIN B-12 TR) 1000 MCG TBCR Take 1,000 mcg by mouth daily.     Marland Kitchen donepezil (ARICEPT) 5 MG tablet Take 5 mg by mouth daily.    . fluticasone (FLONASE) 50 MCG/ACT nasal spray Place into the nose.    Marland Kitchen Fluticasone-Salmeterol (ADVAIR) 250-50 MCG/DOSE AEPB Inhale 1 puff into the lungs daily as needed.     . gabapentin (NEURONTIN) 300 MG capsule 300mg  orally two times a day    . HYDROcodone-acetaminophen (NORCO) 7.5-325 MG tablet 1 tablet by mouth q 6 h prn    . levothyroxine (SYNTHROID, LEVOTHROID) 50 MCG tablet Take 50 mcg by mouth daily before breakfast. Take 30 to 60 minutes before breakfast.    . LORazepam (ATIVAN) 0.5 MG tablet Take 1 tablet (0.5 mg total) by mouth every 8 (eight) hours as needed for anxiety. 30 tablet 0  . montelukast (SINGULAIR) 10 MG tablet Take by mouth.    . Multiple Vitamins-Minerals (HAIR SKIN AND NAILS FORMULA PO) Take 1 tablet by mouth every evening.    . Multiple Vitamins-Minerals (OCUVITE-LUTEIN PO) Take 1 capsule by mouth daily.     . pantoprazole (PROTONIX) 20 MG tablet Take 20 mg by mouth 2 (two) times daily.     . polycarbophil (FIBERCON) 625 MG tablet Take by mouth.    . pravastatin (PRAVACHOL) 20 MG tablet Take 20 mg by mouth daily.    . traMADol (ULTRAM) 50 MG tablet Take 50 mg by mouth 3 (three) times daily as needed for moderate pain or severe pain. pain    . vitamin C (ASCORBIC ACID) 500 MG tablet Take 500 mg by mouth daily.    . vitamin E (E-400) 400 UNIT capsule Take 400 Units by mouth daily.    . memantine (NAMENDA) 5 MG tablet Take 5 mg by mouth 2 (two) times daily.     . phenazopyridine (PYRIDIUM) 200 MG tablet Take 1 tablet (200 mg total) by mouth 3 (three)  times daily as needed for pain. 15 tablet 0   No current facility-administered medications for this visit.     OBJECTIVE: BP (!) 153/73   Pulse (!) 58   Temp 98 F (36.7 C) (Tympanic)   Resp 20   Wt 137 lb 1.6 oz (62.2 kg)   BMI 22.81 kg/m    Body mass index is 22.81 kg/m.    ECOG FS:1 - Symptomatic but completely ambulatory  General: Well-developed, well-nourished, no acute distress. Eyes: Pink conjunctiva, anicteric sclera. HEENT: Small ecchymotic area noted over left eye. Normocephalic, moist mucous membranes, clear oropharnyx. Lungs: Clear to auscultation bilaterally. Heart: Regular rate and rhythm. No rubs, murmurs, or gallops. Musculoskeletal: Large skin tear on left forearm, left leg. Brusing all the way dorm right leg with  golf ball size hematoma on inside right calf. Neuro: Alert, answering all questions appropriately. Cranial nerves grossly intact. Skin: No petechiae noted. Bruising noted in various stages of healing. Psych: Normal affect.   LAB RESULTS:  Office Visit on 08/31/2017  Component Date Value Ref Range Status  . Color, Urine 08/31/2017 YELLOW* YELLOW Final  . APPearance 08/31/2017 CLEAR* CLEAR Final  . Specific Gravity, Urine 08/31/2017 1.020  1.005 - 1.030 Final  . pH 08/31/2017 5.0  5.0 - 8.0 Final  . Glucose, UA 08/31/2017 NEGATIVE  NEGATIVE mg/dL Final  . Hgb urine dipstick 08/31/2017 NEGATIVE  NEGATIVE Final  . Bilirubin Urine 08/31/2017 NEGATIVE  NEGATIVE Final  . Ketones, ur 08/31/2017 NEGATIVE  NEGATIVE mg/dL Final  . Protein, ur 08/31/2017 NEGATIVE  NEGATIVE mg/dL Final  . Nitrite 08/31/2017 NEGATIVE  NEGATIVE Final  . Leukocytes, UA 08/31/2017 NEGATIVE  NEGATIVE Final  . RBC / HPF 08/31/2017 0-5  0 - 5 RBC/hpf Final  . WBC, UA 08/31/2017 0-5  0 - 5 WBC/hpf Final  . Bacteria, UA 08/31/2017 NONE SEEN  NONE SEEN Final  . Squamous Epithelial / LPF 08/31/2017 0-5* NONE SEEN Final  . Mucus 08/31/2017 PRESENT   Final  . Hyaline Casts, UA  08/31/2017 PRESENT   Final  Orders Only on 08/31/2017  Component Date Value Ref Range Status  . WBC 08/31/2017 11.1* 3.6 - 11.0 K/uL Final  . RBC 08/31/2017 4.20  3.80 - 5.20 MIL/uL Final  . Hemoglobin 08/31/2017 11.6* 12.0 - 16.0 g/dL Final  . HCT 08/31/2017 36.5  35.0 - 47.0 % Final  . MCV 08/31/2017 86.8  80.0 - 100.0 fL Final  . MCH 08/31/2017 27.6  26.0 - 34.0 pg Final  . MCHC 08/31/2017 31.8* 32.0 - 36.0 g/dL Final  . RDW 08/31/2017 18.8* 11.5 - 14.5 % Final  . Platelets 08/31/2017 52* 150 - 400 K/uL Final   Comment: RESULT REPEATED AND VERIFIED PLATELET COUNT CONFIRMED BY SMEAR GIANT PLATELETS SEEN   . Neutrophils Relative % 08/31/2017 55  % Final  . Neutro Abs 08/31/2017 6.2  1.4 - 6.5 K/uL Final  . Lymphocytes Relative 08/31/2017 32  % Final  . Lymphs Abs 08/31/2017 3.6  1.0 - 3.6 K/uL Final  . Monocytes Relative 08/31/2017 9  % Final  . Monocytes Absolute 08/31/2017 1.0* 0.2 - 0.9 K/uL Final  . Eosinophils Relative 08/31/2017 2  % Final  . Eosinophils Absolute 08/31/2017 0.2  0 - 0.7 K/uL Final  . Basophils Relative 08/31/2017 2  % Final  . Basophils Absolute 08/31/2017 0.2* 0 - 0.1 K/uL Final    STUDIES: No results found.  ASSESSMENT:  Chronic refractory ITP  PLAN:   1. Chronic refractory ITP: Patient had a poor response to Prednisone, WinRho, and Rituxan. She is status post splenectomy in 2007. Her platelet count is less than 100, therefore will proceed with 7 mcg/kg Nplate today. Return to clinic in 4 weeks for laboratory work, further evaluation, and consideration of Nplate if her platelet is below 100. Patient appears to only requiring injections every 3-4 weeks. Can consider Promacta 12.5mg  at a later date if necessary. Max dose of Nplate is 10 mcg/kg. 2. Abdominal pain: Resolved. 3. Constipation: Patient does not complain of this today. 4. Recent Fall: Patient lives at home with husband and several other family members. She is typically steady on her feet but  occasionally gets disoriented during the night. She denies injury other than visible bruising and skin tears. Will monitor golf ball size  hematoma on her right calf. Continue pressure dressings as needed.  5. Weakness and fatigue: Multifactorial. Although patient states she feels improved after receiving her injections in her platelet increase. 6. Depression/dementia: Monitor. Continue follow-up with primary care. 7. Dysuria/Urinary frequency and hesitancy: UA negative. Urine culture pending. Rx Pyridium 200 mg 3 times a day as needed for [email or web address] patient on side effects of medication including that urine may appear orange in color.  Approximately 30 minutes spent in discussion of which greater than 50% was consultation.  Patient expressed understanding and was in agreement with this plan. She also understands that She can call clinic at any time with any questions, concerns, or complaints.    Jacquelin Hawking, NP   08/31/2017 3:58 PM

## 2017-08-31 ENCOUNTER — Other Ambulatory Visit: Payer: Self-pay

## 2017-08-31 ENCOUNTER — Inpatient Hospital Stay: Payer: Medicare HMO | Attending: Oncology

## 2017-08-31 ENCOUNTER — Inpatient Hospital Stay (HOSPITAL_BASED_OUTPATIENT_CLINIC_OR_DEPARTMENT_OTHER): Payer: Medicare HMO | Admitting: Oncology

## 2017-08-31 ENCOUNTER — Inpatient Hospital Stay: Payer: Medicare HMO

## 2017-08-31 ENCOUNTER — Encounter: Payer: Self-pay | Admitting: Oncology

## 2017-08-31 VITALS — BP 153/73 | HR 58 | Temp 98.0°F | Resp 20 | Wt 137.1 lb

## 2017-08-31 DIAGNOSIS — R531 Weakness: Secondary | ICD-10-CM

## 2017-08-31 DIAGNOSIS — S8011XS Contusion of right lower leg, sequela: Secondary | ICD-10-CM | POA: Insufficient documentation

## 2017-08-31 DIAGNOSIS — R399 Unspecified symptoms and signs involving the genitourinary system: Secondary | ICD-10-CM

## 2017-08-31 DIAGNOSIS — Z79899 Other long term (current) drug therapy: Secondary | ICD-10-CM

## 2017-08-31 DIAGNOSIS — G8929 Other chronic pain: Secondary | ICD-10-CM | POA: Diagnosis not present

## 2017-08-31 DIAGNOSIS — F329 Major depressive disorder, single episode, unspecified: Secondary | ICD-10-CM | POA: Diagnosis not present

## 2017-08-31 DIAGNOSIS — E7800 Pure hypercholesterolemia, unspecified: Secondary | ICD-10-CM | POA: Diagnosis not present

## 2017-08-31 DIAGNOSIS — R300 Dysuria: Secondary | ICD-10-CM | POA: Diagnosis not present

## 2017-08-31 DIAGNOSIS — W1839XS Other fall on same level, sequela: Secondary | ICD-10-CM | POA: Insufficient documentation

## 2017-08-31 DIAGNOSIS — K219 Gastro-esophageal reflux disease without esophagitis: Secondary | ICD-10-CM | POA: Diagnosis not present

## 2017-08-31 DIAGNOSIS — M549 Dorsalgia, unspecified: Secondary | ICD-10-CM | POA: Insufficient documentation

## 2017-08-31 DIAGNOSIS — Z9081 Acquired absence of spleen: Secondary | ICD-10-CM | POA: Insufficient documentation

## 2017-08-31 DIAGNOSIS — R5381 Other malaise: Secondary | ICD-10-CM

## 2017-08-31 DIAGNOSIS — D693 Immune thrombocytopenic purpura: Secondary | ICD-10-CM | POA: Diagnosis not present

## 2017-08-31 DIAGNOSIS — Z86711 Personal history of pulmonary embolism: Secondary | ICD-10-CM | POA: Insufficient documentation

## 2017-08-31 DIAGNOSIS — R5383 Other fatigue: Secondary | ICD-10-CM | POA: Diagnosis not present

## 2017-08-31 DIAGNOSIS — F0390 Unspecified dementia without behavioral disturbance: Secondary | ICD-10-CM | POA: Diagnosis not present

## 2017-08-31 DIAGNOSIS — R3911 Hesitancy of micturition: Secondary | ICD-10-CM

## 2017-08-31 LAB — URINALYSIS, COMPLETE (UACMP) WITH MICROSCOPIC
BACTERIA UA: NONE SEEN
BILIRUBIN URINE: NEGATIVE
GLUCOSE, UA: NEGATIVE mg/dL
HGB URINE DIPSTICK: NEGATIVE
Ketones, ur: NEGATIVE mg/dL
LEUKOCYTES UA: NEGATIVE
NITRITE: NEGATIVE
PROTEIN: NEGATIVE mg/dL
Specific Gravity, Urine: 1.02 (ref 1.005–1.030)
pH: 5 (ref 5.0–8.0)

## 2017-08-31 LAB — CBC WITH DIFFERENTIAL/PLATELET
BASOS PCT: 2 %
Basophils Absolute: 0.2 10*3/uL — ABNORMAL HIGH (ref 0–0.1)
Eosinophils Absolute: 0.2 10*3/uL (ref 0–0.7)
Eosinophils Relative: 2 %
HEMATOCRIT: 36.5 % (ref 35.0–47.0)
HEMOGLOBIN: 11.6 g/dL — AB (ref 12.0–16.0)
LYMPHS PCT: 32 %
Lymphs Abs: 3.6 10*3/uL (ref 1.0–3.6)
MCH: 27.6 pg (ref 26.0–34.0)
MCHC: 31.8 g/dL — ABNORMAL LOW (ref 32.0–36.0)
MCV: 86.8 fL (ref 80.0–100.0)
Monocytes Absolute: 1 10*3/uL — ABNORMAL HIGH (ref 0.2–0.9)
Monocytes Relative: 9 %
NEUTROS PCT: 55 %
Neutro Abs: 6.2 10*3/uL (ref 1.4–6.5)
Platelets: 52 10*3/uL — ABNORMAL LOW (ref 150–400)
RBC: 4.2 MIL/uL (ref 3.80–5.20)
RDW: 18.8 % — ABNORMAL HIGH (ref 11.5–14.5)
WBC: 11.1 10*3/uL — AB (ref 3.6–11.0)

## 2017-08-31 MED ORDER — PHENAZOPYRIDINE HCL 200 MG PO TABS: 200.0000 mg | ORAL_TABLET | Freq: Three times a day (TID) | ORAL | 0 refills | Status: DC | PRN

## 2017-08-31 MED ORDER — ROMIPLOSTIM INJECTION 500 MCG
430.0000 ug | Freq: Once | SUBCUTANEOUS | Status: AC
  Administered 2017-08-31: 430 ug via SUBCUTANEOUS
  Filled 2017-08-31: qty 0.86

## 2017-08-31 NOTE — Progress Notes (Signed)
Patient here today for follow up regarding ITP. Patient reports fall yesterday morning while trying to go the bathroom, has skin tear to left arm and left leg and hematoma on lower right leg. Patients son also reports patient has burning and difficulty with urination over the last 3-4 days.

## 2017-09-01 LAB — URINE CULTURE

## 2017-09-08 DIAGNOSIS — S8012XA Contusion of left lower leg, initial encounter: Secondary | ICD-10-CM | POA: Diagnosis not present

## 2017-09-08 DIAGNOSIS — W19XXXA Unspecified fall, initial encounter: Secondary | ICD-10-CM | POA: Diagnosis not present

## 2017-09-08 DIAGNOSIS — S8011XA Contusion of right lower leg, initial encounter: Secondary | ICD-10-CM | POA: Diagnosis not present

## 2017-09-10 DIAGNOSIS — S8012XD Contusion of left lower leg, subsequent encounter: Secondary | ICD-10-CM | POA: Diagnosis not present

## 2017-09-10 DIAGNOSIS — M5136 Other intervertebral disc degeneration, lumbar region: Secondary | ICD-10-CM | POA: Diagnosis not present

## 2017-09-10 DIAGNOSIS — J45909 Unspecified asthma, uncomplicated: Secondary | ICD-10-CM | POA: Diagnosis not present

## 2017-09-10 DIAGNOSIS — I10 Essential (primary) hypertension: Secondary | ICD-10-CM | POA: Diagnosis not present

## 2017-09-10 DIAGNOSIS — F0390 Unspecified dementia without behavioral disturbance: Secondary | ICD-10-CM | POA: Diagnosis not present

## 2017-09-10 DIAGNOSIS — M5030 Other cervical disc degeneration, unspecified cervical region: Secondary | ICD-10-CM | POA: Diagnosis not present

## 2017-09-10 DIAGNOSIS — I4891 Unspecified atrial fibrillation: Secondary | ICD-10-CM | POA: Diagnosis not present

## 2017-09-10 DIAGNOSIS — S8011XD Contusion of right lower leg, subsequent encounter: Secondary | ICD-10-CM | POA: Diagnosis not present

## 2017-09-10 DIAGNOSIS — D693 Immune thrombocytopenic purpura: Secondary | ICD-10-CM | POA: Diagnosis not present

## 2017-09-13 DIAGNOSIS — D473 Essential (hemorrhagic) thrombocythemia: Secondary | ICD-10-CM | POA: Diagnosis not present

## 2017-09-13 DIAGNOSIS — I10 Essential (primary) hypertension: Secondary | ICD-10-CM | POA: Diagnosis not present

## 2017-09-13 DIAGNOSIS — E7800 Pure hypercholesterolemia, unspecified: Secondary | ICD-10-CM | POA: Diagnosis not present

## 2017-09-13 DIAGNOSIS — E039 Hypothyroidism, unspecified: Secondary | ICD-10-CM | POA: Diagnosis not present

## 2017-09-13 DIAGNOSIS — F419 Anxiety disorder, unspecified: Secondary | ICD-10-CM | POA: Diagnosis not present

## 2017-09-13 DIAGNOSIS — F0280 Dementia in other diseases classified elsewhere without behavioral disturbance: Secondary | ICD-10-CM | POA: Diagnosis not present

## 2017-09-13 DIAGNOSIS — G301 Alzheimer's disease with late onset: Secondary | ICD-10-CM | POA: Diagnosis not present

## 2017-09-13 DIAGNOSIS — Z79899 Other long term (current) drug therapy: Secondary | ICD-10-CM | POA: Diagnosis not present

## 2017-09-13 DIAGNOSIS — J4530 Mild persistent asthma, uncomplicated: Secondary | ICD-10-CM | POA: Diagnosis not present

## 2017-09-15 ENCOUNTER — Encounter: Payer: Medicare HMO | Attending: Internal Medicine | Admitting: Internal Medicine

## 2017-09-15 DIAGNOSIS — S81802A Unspecified open wound, left lower leg, initial encounter: Secondary | ICD-10-CM | POA: Diagnosis not present

## 2017-09-15 DIAGNOSIS — X58XXXD Exposure to other specified factors, subsequent encounter: Secondary | ICD-10-CM | POA: Insufficient documentation

## 2017-09-15 DIAGNOSIS — Z885 Allergy status to narcotic agent status: Secondary | ICD-10-CM | POA: Insufficient documentation

## 2017-09-15 DIAGNOSIS — S8012XD Contusion of left lower leg, subsequent encounter: Secondary | ICD-10-CM | POA: Insufficient documentation

## 2017-09-15 DIAGNOSIS — S81801A Unspecified open wound, right lower leg, initial encounter: Secondary | ICD-10-CM | POA: Diagnosis not present

## 2017-09-15 DIAGNOSIS — S8011XD Contusion of right lower leg, subsequent encounter: Secondary | ICD-10-CM | POA: Insufficient documentation

## 2017-09-15 DIAGNOSIS — D693 Immune thrombocytopenic purpura: Secondary | ICD-10-CM | POA: Diagnosis not present

## 2017-09-16 DIAGNOSIS — D693 Immune thrombocytopenic purpura: Secondary | ICD-10-CM | POA: Diagnosis not present

## 2017-09-16 DIAGNOSIS — S8012XD Contusion of left lower leg, subsequent encounter: Secondary | ICD-10-CM | POA: Diagnosis not present

## 2017-09-16 DIAGNOSIS — S8011XD Contusion of right lower leg, subsequent encounter: Secondary | ICD-10-CM | POA: Diagnosis not present

## 2017-09-16 DIAGNOSIS — I10 Essential (primary) hypertension: Secondary | ICD-10-CM | POA: Diagnosis not present

## 2017-09-16 DIAGNOSIS — I4891 Unspecified atrial fibrillation: Secondary | ICD-10-CM | POA: Diagnosis not present

## 2017-09-16 DIAGNOSIS — M5030 Other cervical disc degeneration, unspecified cervical region: Secondary | ICD-10-CM | POA: Diagnosis not present

## 2017-09-16 DIAGNOSIS — M5136 Other intervertebral disc degeneration, lumbar region: Secondary | ICD-10-CM | POA: Diagnosis not present

## 2017-09-16 DIAGNOSIS — J45909 Unspecified asthma, uncomplicated: Secondary | ICD-10-CM | POA: Diagnosis not present

## 2017-09-16 DIAGNOSIS — F0390 Unspecified dementia without behavioral disturbance: Secondary | ICD-10-CM | POA: Diagnosis not present

## 2017-09-16 NOTE — Progress Notes (Signed)
MEOSHA, CASTANON ([id number]) Visit Report for 09/15/2017 Abuse/Suicide Risk Screen Details Patient Name: Ariana [name], Ariana [name]. Date of Service: 09/15/2017 10:30 AM Medical Record Number: [id number] Patient Account Number: [id number] Date of Birth/Sex: 1932-11-03 (83 y.o. Female) Treating RN: Ahmed Prima Primary Care [place]: Fulton Reek Other Clinician: Referring Rejina Odle: Mortimer Fries Treating Terez Freimark/Extender: Tito Dine in Treatment: 0 Abuse/Suicide Risk Screen Items Answer ABUSE/SUICIDE RISK SCREEN: Has anyone close to you tried to hurt or harm you recentlyo No Do you feel uncomfortable with anyone in your familyo No Has anyone forced you do things that you didnot want to doo No Do you have any thoughts of harming yourselfo No Patient displays signs or symptoms of abuse and/or neglect. No Electronic Signature(s) Signed: 09/15/2017 5:20:26 PM By: Alric Quan Entered By: Alric Quan on 09/15/2017 11:08:32 Ariana [name] ([id number]) -------------------------------------------------------------------------------- Activities of Daily Living Details Patient Name: Ariana [name]. Date of Service: 09/15/2017 10:30 AM Medical Record Number: [id number] Patient Account Number: [id number] Date of Birth/Sex: April 28, 1933 (83 y.o. Female) Treating RN: Carolyne Fiscal, Debi Primary Care Joniece Smotherman: Fulton Reek Other Clinician: Referring Brenlynn Fake: Mortimer Fries Treating Yeslin Delio/Extender: Tito Dine in Treatment: 0 Activities of Daily Living Items Answer Activities of Daily Living (Please select one for each item) Drive Automobile Completely Able Take Medications Need Assistance Use Telephone Completely Able Care for Appearance Completely Able Use Toilet Completely Able Bath / Shower Completely Able Dress Self Completely Able Feed Self Completely Able Walk Completely Able Get In / Out Bed Completely Able Housework Need  Assistance Prepare Meals Need Assistance Handle Money Need Assistance Shop for Self Need Assistance Electronic Signature(s) Signed: 09/15/2017 5:20:26 PM By: Alric Quan Entered By: Alric Quan on 09/15/2017 11:09:30 Ariana [name] ([id number]) -------------------------------------------------------------------------------- Education Assessment Details Patient Name: Ariana [name] Date of Service: 09/15/2017 10:30 AM Medical Record Number: [id number] Patient Account Number: [id number] Date of Birth/Sex: [birth year] (83 y.o. Female) Treating RN: Carolyne Fiscal, Debi Primary Care Loveta Dellis: Fulton Reek Other Clinician: Referring Rheya Minogue: Mortimer Fries Treating Rajanee Schuelke/Extender: Tito Dine in Treatment: 0 Primary Learner Assessed: Patient Learning Preferences/Education Level/Primary Language Learning Preference: Explanation, Printed Material Highest Education Level: High School Preferred Language: English Cognitive Barrier Assessment/Beliefs Language Barrier: No Translator Needed: No Memory Deficit: No Emotional Barrier: No Cultural/Religious Beliefs Affecting Medical Care: No Physical Barrier Assessment Impaired Vision: Yes Glasses Impaired Hearing: No Decreased Hand dexterity: No Knowledge/Comprehension Assessment Knowledge Level: Medium Comprehension Level: Medium Ability to understand written Medium instructions: Ability to understand verbal Medium instructions: Motivation Assessment Anxiety Level: Calm Cooperation: Cooperative Education Importance: Acknowledges Need Interest in Health Problems: Asks Questions Perception: Coherent Willingness to Engage in Self- Medium Management Activities: Readiness to Engage in Self- Medium Management Activities: Electronic Signature(s) Signed: 09/15/2017 5:20:26 PM By: Alric Quan Entered By: Alric Quan on 09/15/2017 11:09:51 Ariana [name]  ([id number]) -------------------------------------------------------------------------------- Fall Risk Assessment Details Patient Name: Ariana [name] Date of Service: 09/15/2017 10:30 AM Medical Record Number: [id number] Patient Account Number: [id number] Date of Birth/Sex: [birth year] (83 y.o. Female) Treating RN: Carolyne Fiscal, Debi Primary Care Eleana Tocco: Fulton Reek Other Clinician: Referring Lethia Donlon: Mortimer Fries Treating Demaurion Dicioccio/Extender: Tito Dine in Treatment: 0 Fall Risk Assessment Items Have you had 2 or more falls in the last 12 monthso 0 Yes Have you had any fall that resulted in injury in the last 12 monthso 0 Yes FALL RISK ASSESSMENT: History of falling - immediate or within 3 months 25 Yes Secondary diagnosis 15 Yes Ambulatory aid None/bed rest/wheelchair/nurse 0 No Crutches/cane/walker 0 No  Furniture 0 No IV Access/Saline Lock 0 No Gait/Training Normal/bed rest/immobile 0 No Weak 0 No Impaired 0 No Mental Status Oriented to own ability 0 No Electronic Signature(s) Signed: 09/15/2017 5:20:26 PM By: Alric Quan Entered By: Alric Quan on 09/15/2017 11:10:31 Ariana [name] ([id number]) -------------------------------------------------------------------------------- Foot Assessment Details Patient Name: Ariana [name]. Date of Service: 09/15/2017 10:30 AM Medical Record Number: [id number] Patient Account Number: [id number] Date of Birth/Sex: 03/12/1933 (83 y.o. Female) Treating RN: Ahmed Prima Primary Care Martine Trageser: Fulton Reek Other Clinician: Referring Monna Crean: Mortimer Fries Treating Dhamar Gregory/Extender: Tito Dine in Treatment: 0 Foot Assessment Items Site Locations + = Sensation present, - = Sensation absent, C = Callus, U = Ulcer R = Redness, W = Warmth, M = Maceration, PU = Pre-ulcerative lesion F = Fissure, S = Swelling, D = Dryness Assessment Right: Left: Other Deformity: No No Prior Foot  Ulcer: No No Prior Amputation: No No Charcot Joint: No No Ambulatory Status: Ambulatory Without Help Gait: Steady Electronic Signature(s) Signed: 09/15/2017 5:20:26 PM By: Alric Quan Entered By: Alric Quan on 09/15/2017 11:13:44 Ariana [name] ([id number]) -------------------------------------------------------------------------------- Nutrition Risk Assessment Details Patient Name: Ariana [name]. Date of Service: 09/15/2017 10:30 AM Medical Record Number: [id number] Patient Account Number: [id number] Date of Birth/Sex: 07/21/1933 (83 y.o. Female) Treating RN: Carolyne Fiscal, Debi Primary Care Jamis Kryder: Fulton Reek Other Clinician: Referring Soniyah Mcglory: Mortimer Fries Treating Issaac Shipper/Extender: Tito Dine in Treatment: 0 Height (in): 65 Weight (lbs): 133.4 Body Mass Index (BMI): 22.2 Nutrition Risk Assessment Items NUTRITION RISK SCREEN: I have an illness or condition that made me change the kind and/or amount of 0 No food I eat I eat fewer than two meals per day 3 Yes I eat few fruits and vegetables, or milk products 0 No I have three or more drinks of beer, liquor or wine almost every day 0 No I have tooth or mouth problems that make it hard for me to eat 0 No I don't always have enough money to buy the food I need 0 No I eat alone most of the time 0 No I take three or more different prescribed or over-the-counter drugs a day 1 Yes Without wanting to, I have lost or gained 10 pounds in the last six months 2 Yes I am not always physically able to shop, cook and/or feed myself 2 Yes Nutrition Protocols Good Risk Protocol Moderate Risk Protocol Electronic Signature(s) Signed: 09/15/2017 5:20:26 PM By: Alric Quan Entered By: Alric Quan on 09/15/2017 11:10:58

## 2017-09-17 DIAGNOSIS — I4891 Unspecified atrial fibrillation: Secondary | ICD-10-CM | POA: Diagnosis not present

## 2017-09-17 DIAGNOSIS — S8011XD Contusion of right lower leg, subsequent encounter: Secondary | ICD-10-CM | POA: Diagnosis not present

## 2017-09-17 DIAGNOSIS — J45909 Unspecified asthma, uncomplicated: Secondary | ICD-10-CM | POA: Diagnosis not present

## 2017-09-17 DIAGNOSIS — S8012XD Contusion of left lower leg, subsequent encounter: Secondary | ICD-10-CM | POA: Diagnosis not present

## 2017-09-17 DIAGNOSIS — M5030 Other cervical disc degeneration, unspecified cervical region: Secondary | ICD-10-CM | POA: Diagnosis not present

## 2017-09-17 DIAGNOSIS — I10 Essential (primary) hypertension: Secondary | ICD-10-CM | POA: Diagnosis not present

## 2017-09-17 DIAGNOSIS — M5136 Other intervertebral disc degeneration, lumbar region: Secondary | ICD-10-CM | POA: Diagnosis not present

## 2017-09-17 DIAGNOSIS — F0390 Unspecified dementia without behavioral disturbance: Secondary | ICD-10-CM | POA: Diagnosis not present

## 2017-09-17 DIAGNOSIS — D693 Immune thrombocytopenic purpura: Secondary | ICD-10-CM | POA: Diagnosis not present

## 2017-09-17 NOTE — Progress Notes (Signed)
ANAYA, BOVEE ([id number]) Visit Report for 09/15/2017 Chief Complaint Document Details Patient Name: Ariana [name], Ariana [name]. Date of Service: 09/15/2017 10:30 AM Medical Record Number: [id number] Patient Account Number: [id number] Date of Birth/Sex: [birth year] (84 y.o. Female) Treating RN: Carolyne Fiscal, Debi Primary Care Provider: Fulton Reek Other Clinician: Referring Provider: Mortimer Fries Treating Provider/Extender: Tito Dine in Treatment: 0 Information Obtained from: Patient Chief Complaint 09/15/17; patient arrives for traumatic wounds on her left greater than right leg Electronic Signature(s) Signed: 09/15/2017 5:29:04 PM By: Linton Ham MD Entered By: Linton Ham on 09/15/2017 12:53:38 Ariana [name] ([id number]) -------------------------------------------------------------------------------- HPI Details Patient Name: Ariana [name] Date of Service: 09/15/2017 10:30 AM Medical Record Number: [id number] Patient Account Number: [id number] Date of Birth/Sex: 1933/08/12 (84 y.o. Female) Treating RN: Carolyne Fiscal, Debi Primary Care Provider: Fulton Reek Other Clinician: Referring Provider: Mortimer Fries Treating Provider/Extender: Tito Dine in Treatment: 0 History of Present Illness HPI Description: 09/15/17; this is an 83 year old woman with a history of type 2 diabetes, chronic ITP for which she follows with oncology. She had a splenectomy in 2007. 3 weeks ago well sitting in the bathroom she fell off the platelet and hit her legs on the bathtub. She had 2 large hematomas. The area on the left has ruptured into a wound where the area on the right medial lower leg is actually still intact. She was given Keflex of concern for coexistent cellulitis on 11/28 the area on the right is still intact and she is not doing anything specific for that. She has a history of small hematomas related to thrombocytopenia on her lower legs although  these all seem to have primarily healed. She has a history of falls Electronic Signature(s) Signed: 09/15/2017 5:29:04 PM By: Linton Ham MD Entered By: Linton Ham on 09/15/2017 12:55:54 Ariana [name] ([id number]) -------------------------------------------------------------------------------- Physical Exam Details Patient Name: Ariana [name] Date of Service: 09/15/2017 10:30 AM Medical Record Number: [id number] Patient Account Number: [id number] Date of Birth/Sex: 12/26/1932 (84 y.o. Female) Treating RN: Ahmed Prima Primary Care Provider: Fulton Reek Other Clinician: Referring Provider: Mortimer Fries Treating Provider/Extender: Tito Dine in Treatment: 0 Constitutional Patient is hypotensive.. Pulse regular and within target range for patient.Marland Kitchen Respirations regular, non-labored and within target range.. Temperature is normal and within the target range for the patient.Marland Kitchen appears in no distress. Eyes Conjunctivae clear. No discharge. Respiratory Respiratory effort is easy and symmetric bilaterally. Rate is normal at rest and on room air.. Bilateral breath sounds are clear and equal in all lobes with no wheezes, rales or rhonchi.. Cardiovascular Femoral arteries without bruits and pulses strong.. Pedal pulses palpable and strong bilaterally.. she has skin changes of chronic venous insufficiency on both legs but no major edema. Gastrointestinal (GI) no masses noted. Lymphatic none palpable in the popliteal or inguinal area. Integumentary (Hair, Skin) no overt rashes seen. Psychiatric No evidence of depression, anxiety, or agitation. Calm, cooperative, and communicative. Appropriate interactions and affect.. Notes wound exam; the patient has a large area on the left anterior lower leg which actually has healthy granulation tissue. There is no evidence of infection. We will use Hydrofera Blue under border foam oThe area on the lower left medial  lower leg still has an intact hematoma. Certainly would likely to rupture at some point. oThere is no evidence of surrounding infection in either site Electronic Signature(s) Signed: 09/15/2017 5:29:04 PM By: Linton Ham MD Entered By: Linton Ham on 09/15/2017 12:57:51 Ariana [name] ([id number]) -------------------------------------------------------------------------------- Physician Orders Details Patient  Name: ERCIA, CRISAFULLI. Date of Service: 09/15/2017 10:30 AM Medical Record Number: [id number] Patient Account Number: [id number] Date of Birth/Sex: 1933/01/27 (84 y.o. Female) Treating RN: Carolyne Fiscal, Debi Primary Care Provider: Fulton Reek Other Clinician: Referring Provider: Mortimer Fries Treating Provider/Extender: Tito Dine in Treatment: 0 Verbal / Phone Orders: Yes Clinician: [place], Debi Read Back and Verified: Yes Diagnosis Coding Wound Cleansing Wound #1 Left,Lateral Lower Leg o Clean wound with Normal Saline. o Cleanse wound with mild soap and water o May Shower, gently pat wound dry prior to applying new dressing. Wound #2 Right,Medial Lower Leg o Clean wound with Normal Saline. o Cleanse wound with mild soap and water o May Shower, gently pat wound dry prior to applying new dressing. Anesthetic Wound #1 Left,Lateral Lower Leg o Topical Lidocaine 4% cream applied to wound bed prior to debridement Wound #2 Right,Medial Lower Leg o Topical Lidocaine 4% cream applied to wound bed prior to debridement Skin Barriers/Peri-Wound Care Wound #1 Left,Lateral Lower Leg o Skin Prep Wound #2 Right,Medial Lower Leg o Skin Prep Primary Wound Dressing Wound #1 Left,Lateral Lower Leg o Hydrafera Blue Secondary Dressing Wound #1 Left,Lateral Lower Leg o Boardered Foam Dressing Wound #2 Right,Medial Lower Leg o Boardered Foam Dressing Dressing Change Frequency Wound #1 Left,Lateral Lower Leg o Change Dressing  Monday, Wednesday, Friday - HHRN to change dressing to change dressing Mondays and Fridays and pt will come to [facility] on Wednesdays. Wound #2 Right,Medial Lower Leg Ariana [name], Ariana [name] ([id number]) o Change Dressing Monday, Wednesday, Friday - HHRN to change dressing to change dressing Mondays and Fridays and pt will come to [facility] on Wednesdays. Follow-up Appointments Wound #1 Left,Lateral Lower Leg o Return Appointment in 1 week. Wound #2 Right,Medial Lower Leg o Return Appointment in 1 week. Edema Control Wound #1 Left,Lateral Lower Leg o Elevate legs to the level of the heart and pump ankles as often as possible Wound #2 Right,Medial Lower Leg o Elevate legs to the level of the heart and pump ankles as often as possible Additional Orders / Instructions Wound #1 Left,Lateral Lower Leg o Increase protein intake. Wound #2 Right,Medial Lower Leg o Increase protein intake. Home Health Wound #1 [place] for [place] Nurse may visit PRN to address patientos wound care needs. o FACE TO FACE ENCOUNTER: MEDICARE and MEDICAID PATIENTS: I certify that this patient is under my care and that I had a face-to-face encounter that meets the physician face-to-face encounter requirements with this patient on this date. The encounter with the patient was in whole or in part for the following MEDICAL CONDITION: (primary reason for [place]) MEDICAL NECESSITY: I certify, that based on my findings, NURSING services are a medically necessary home health service. HOME BOUND STATUS: I certify that my clinical findings support that this patient is homebound (i.e., Due to illness or injury, pt requires aid of supportive devices such as crutches, cane, wheelchairs, walkers, the use of special transportation or the assistance of another person to leave their place of residence. There is a normal inability  to leave the home and doing so requires considerable and taxing effort. Other absences are for medical reasons / religious services and are infrequent or of short duration when for other reasons). o If current dressing causes regression in wound condition, may D/C ordered dressing product/s and apply Normal Saline Moist Dressing daily until next [place] / Other MD appointment. Notify [facility] of regression in wound condition at [phone]. o Please direct any NON-WOUND related issues/requests for orders to patient's Primary Care Physician Wound #2 [place] for [place] Nurse may visit PRN to address patientos wound care needs. o FACE TO FACE ENCOUNTER: MEDICARE and MEDICAID PATIENTS: I certify that this patient is under my care and that I had a face-to-face encounter that meets the physician face-to-face encounter requirements with this patient on this date. The encounter with the patient was in whole or in part for the following MEDICAL CONDITION: (primary reason for [place]) MEDICAL NECESSITY: I certify, that based on my findings, NURSING services are a medically necessary home health service. HOME BOUND STATUS: I certify that my clinical findings support that this patient is homebound (i.e., Due to illness or injury, pt requires aid of supportive devices such as crutches, cane, wheelchairs, walkers, the use of special transportation or the assistance of another person to leave their place of residence. There is a normal inability to leave the home and doing so requires considerable and taxing effort. Other absences are for medical reasons / religious services and are infrequent or of short duration when for other reasons). Ariana [name], Ariana [name] ([id number]) o If current dressing causes regression in wound condition, may D/C ordered dressing product/s and apply Normal Saline Moist Dressing  daily until next [place] / Other MD appointment. [place] of regression in wound condition at [phone]. o Please direct any NON-WOUND related issues/requests for orders to patient's Primary Care Physician Electronic Signature(s) Signed: 09/15/2017 5:20:26 PM By: Alric Quan Signed: 09/15/2017 5:29:04 PM By: Linton Ham MD Entered By: Alric Quan on 09/15/2017 12:03:34 Ariana [name] ([id number]) -------------------------------------------------------------------------------- Problem List Details Patient Name: Ariana [name] Date of Service: 09/15/2017 10:30 AM Medical Record Number: [id number] Patient Account Number: [id number] Date of Birth/Sex: 06-Mar-1933 (84 y.o. 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 Date of Service: 09/15/2017 10:30 AM Medical Record Number: [id number] Patient Account Number: [id number] Date of Birth/Sex: June 25, 1933 (84 y.o. Female) Treating RN: Carolyne Fiscal, Debi Primary Care Provider: Fulton Reek Other Clinician: Referring Provider: Mortimer Fries Treating Provider/Extender: Tito Dine in Treatment: 0 Subjective Chief  Complaint Information obtained from Patient 09/15/17; patient arrives for traumatic wounds on her left greater than right leg History of Present Illness (HPI) 09/15/17; this is an 83 year old woman with a history of type 2 diabetes, chronic ITP for which she follows with oncology. She had a splenectomy in 2007. 3 weeks ago well sitting in the bathroom she fell off the platelet and hit her legs on the bathtub. She had 2 large hematomas. The area on the left has ruptured into a wound where the area on the right medial lower leg is actually still intact. She was given Keflex of concern for coexistent cellulitis on 11/28 the area on the right is still intact and she is not doing anything specific for that. She has a history  of small hematomas related to thrombocytopenia on her lower legs although these all seem to have primarily healed. She has a history of falls Wound History Patient presents with 2 open wounds that have been present for approximately 3 week. Patient has been treating wounds in the following manner: vasline and bandages. Laboratory tests have not been performed in the last month. Patient reportedly has not tested positive for an antibiotic resistant organism. Patient reportedly has not tested positive for osteomyelitis. Patient reportedly has not had testing performed to evaluate circulation in the legs. Patient experiences the following problems associated with their wounds: swelling. Patient History Information obtained from Patient. Allergies aspirin, diazepam, morphine, tetanus and diphtheria toxoids, adsorbed, adult Family History Diabetes - Siblings, Hypertension - Mother, Stroke - Maternal Grandparents, No family history of Cancer, Heart Disease, Hereditary Spherocytosis, Kidney Disease, Lung Disease, Seizures, Thyroid Problems, Tuberculosis. Social History Never smoker, Marital Status - Married, Alcohol Use - Never, Drug Use - No History, Caffeine Use - Rarely. Medical  History Eyes Patient has history of Cataracts - surgery Review of Systems (ROS) Constitutional Symptoms (General Health) The patient has no complaints or symptoms. Eyes Complains or has symptoms of Glasses / Contacts. Ariana [name], Ariana [name] ([id number]) Objective Constitutional Patient is hypotensive.. Pulse regular and within target range for patient.Marland Kitchen Respirations regular, non-labored and within target range.. Temperature is normal and within the target range for the patient.Marland Kitchen appears in no distress. Vitals Time Taken: 10:59 AM, Height: 65 in, Source: Stated, Weight: 133.4 lbs, Source: Measured, BMI: 22.2, Temperature: 97.6 F, Pulse: 62 bpm, Respiratory Rate: 18 breaths/min, Blood Pressure: 99/79 mmHg. Eyes Conjunctivae clear. No discharge. Respiratory Respiratory effort is easy and symmetric bilaterally. Rate is normal at rest and on room air.. Bilateral breath sounds are clear and equal in all lobes with no wheezes, rales or rhonchi.. Cardiovascular Femoral arteries without bruits and pulses strong.. Pedal pulses palpable and strong bilaterally.. she has skin changes of chronic venous insufficiency on both legs but no major edema. Gastrointestinal (GI) no masses noted. Lymphatic none palpable in the popliteal or inguinal area. Psychiatric No evidence of depression, anxiety, or agitation. Calm, cooperative, and communicative. Appropriate interactions and affect.. General Notes: wound exam; the patient has a large area on the left anterior lower leg which actually has healthy granulation tissue. There is no evidence of infection. We will use Hydrofera Blue under border foam The area on the lower left medial lower leg still has an intact hematoma. Certainly would likely to rupture at some point. There is no evidence of surrounding infection in either site Integumentary (Hair, Skin) no overt rashes seen. Wound #1 status is Open. Original cause of wound was Trauma. The wound is  located on the Left,Lateral Lower Leg. The wound measures 4.9cm length x 3.1cm width x 0.1cm depth; 11.93cm^2 area and 1.193cm^3 volume. There is no tunneling or undermining noted. There is a large amount of serosanguineous drainage noted. The wound margin is distinct with the outline attached to the wound base. There is medium (34-66%) red granulation within the wound bed. There is a medium (34- 66%) amount of necrotic tissue within the wound bed including Adherent Slough. The periwound skin appearance exhibited: Erythema. The surrounding wound skin color is noted with erythema which is circumferential. Wound #2 status is Open. Original cause of wound was Trauma. The wound is located on the Right,Medial Lower Leg. The wound measures 4.5cm length x 3.7cm width x 0.1cm depth; 13.077cm^2 area and 1.308cm^3 volume. There is no tunneling or undermining  noted. There is a none present amount of drainage noted. The wound margin is distinct with the outline attached to the wound base. There is no granulation within the wound bed. There is a large (67-100%) amount of necrotic tissue within the wound bed including Eschar. Periwound temperature was noted as No Abnormality. Ariana [name], Ariana [name] ([id number]) Assessment Active Problems ICD-10 S80.12XD - Contusion of left lower leg, subsequent encounter S80.11XD - Contusion of right lower leg, subsequent encounter D69.3 - Immune thrombocytopenic purpura Plan Wound Cleansing: Wound #1 Left,Lateral Lower Leg: Clean wound with Normal Saline. Cleanse wound with mild soap and water May Shower, gently pat wound dry prior to applying new dressing. Wound #2 Right,Medial Lower Leg: Clean wound with Normal Saline. Cleanse wound with mild soap and water May Shower, gently pat wound dry prior to applying new dressing. Anesthetic: Wound #1 Left,Lateral Lower Leg: Topical Lidocaine 4% cream applied to wound bed prior to debridement Wound #2 Right,Medial Lower  Leg: Topical Lidocaine 4% cream applied to wound bed prior to debridement Skin Barriers/Peri-Wound Care: Wound #1 Left,Lateral Lower Leg: Skin Prep Wound #2 Right,Medial Lower Leg: Skin Prep Primary Wound Dressing: Wound #1 Left,Lateral Lower Leg: Hydrafera Blue Secondary Dressing: Wound #1 Left,Lateral Lower Leg: Boardered Foam Dressing Wound #2 Right,Medial Lower Leg: Boardered Foam Dressing Dressing Change Frequency: Wound #1 Left,Lateral Lower Leg: Change Dressing Monday, Wednesday, Friday - HHRN to change dressing to change dressing Mondays and Fridays and pt will come to [facility] on Wednesdays. Wound #2 Right,Medial Lower Leg: Change Dressing Monday, Wednesday, Friday - HHRN to change dressing to change dressing Mondays and Fridays and pt will come to [facility] on Wednesdays. Follow-up Appointments: Wound #1 Left,Lateral Lower Leg: Return Appointment in 1 week. Wound #2 Right,Medial Lower Leg: Return Appointment in 1 week. Edema Control: Ariana [name], Ariana [name] ([id number]) Wound #1 Left,Lateral Lower Leg: Elevate legs to the level of the heart and pump ankles as often as possible Wound #2 Right,Medial Lower Leg: Elevate legs to the level of the heart and pump ankles as often as possible Additional Orders / Instructions: Wound #1 Left,Lateral Lower Leg: Increase protein intake. Wound #2 Right,Medial Lower Leg: Increase protein intake. Home Health: Wound #1 Left,Lateral Lower Leg: [place] for [place] Nurse may visit PRN to address patient s wound care needs. FACE TO FACE ENCOUNTER: MEDICARE and MEDICAID PATIENTS: I certify that this patient is under my care and that I had a face-to-face encounter that meets the physician face-to-face encounter requirements with this patient on this date. The encounter with the patient was in whole or in part for the following MEDICAL CONDITION: (primary reason for [place]) MEDICAL  NECESSITY: I certify, that based on my findings, NURSING services are a medically necessary home health service. HOME BOUND STATUS: I certify that my clinical findings support that this patient is homebound (i.e., Due to illness or injury, pt requires aid of supportive devices such as crutches, cane, wheelchairs, walkers, the use of special transportation or the assistance of another person to leave their place of residence. There is a normal inability to leave the home and doing so requires considerable and taxing effort. Other absences are for medical reasons / religious services and are infrequent or of short duration when for other reasons). If current dressing causes regression in wound condition, may D/C ordered dressing product/s and apply Normal Saline Moist Dressing daily until next [place] / Other MD appointment. [place] of regression  in wound condition at [phone]. Please direct any NON-WOUND related issues/requests for orders to patient's Primary Care Physician Wound #2 Right,Medial Lower Leg: [place] for [place] Nurse may visit PRN to address patient s wound care needs. FACE TO FACE ENCOUNTER: MEDICARE and MEDICAID PATIENTS: I certify that this patient is under my care and that I had a face-to-face encounter that meets the physician face-to-face encounter requirements with this patient on this date. The encounter with the patient was in whole or in part for the following MEDICAL CONDITION: (primary reason for [place]) MEDICAL NECESSITY: I certify, that based on my findings, NURSING services are a medically necessary home health service. HOME BOUND STATUS: I certify that my clinical findings support that this patient is homebound (i.e., Due to illness or injury, pt requires aid of supportive devices such as crutches, cane, wheelchairs, walkers, the use of special transportation or the assistance of another  person to leave their place of residence. There is a normal inability to leave the home and doing so requires considerable and taxing effort. Other absences are for medical reasons / religious services and are infrequent or of short duration when for other reasons). If current dressing causes regression in wound condition, may D/C ordered dressing product/s and apply Normal Saline Moist Dressing daily until next [place] / Other MD appointment. [place] of regression in wound condition at [phone]. Please direct any NON-WOUND related issues/requests for orders to patient's Primary Care Physician #1 to the left leg wound Hydrofera Blue and border foam we will see if we can get advanced Homecare to change the dressings #2 right leg hematoma is still intact and I'll cover this was simply a foam-based dressing at this point. An option would've been to wrap both her legs I did not go in that direction #3 she has frail looking skin on the lower extremities probably related to chronic venous insufficiency with hemosiderin deposition. She is not currently on prednisone #4 she did which is chronic. She has been receiving Nplate Ariana [name], Ariana [name] ([id number]) Electronic Signature(s) Signed: 09/15/2017 5:29:04 PM By: Linton Ham MD Entered By: Linton Ham on 09/15/2017 12:59:03 Ariana [name] ([id number]) -------------------------------------------------------------------------------- ROS/PFSH Details Patient Name: Ariana [name] Date of Service: 09/15/2017 10:30 AM Medical Record Number: [id number] Patient Account Number: [id number] Date of Birth/Sex: May 28, 1933 (84 y.o. Female) Treating RN: Carolyne Fiscal, Debi Primary Care Provider: Fulton Reek Other Clinician: Referring Provider: Mortimer Fries Treating Provider/Extender: Tito Dine in Treatment: 0 Information Obtained From Patient Wound History Do you currently have one or more  open woundso Yes How many open wounds do you currently haveo 2 Approximately how long have you had your woundso 3 week How have you been treating your wound(s) until nowo vasline and bandages Has your wound(s) ever healed and then re-openedo No Have you had any lab work done in the past montho No Have you tested positive for an antibiotic resistant organism (MRSA, VRE)o No Have you tested positive for osteomyelitis (bone infection)o No Have you had any tests for circulation on your legso No Have you had other problems associated with your woundso Swelling Eyes Complaints and Symptoms: Positive for: Glasses / Contacts Medical History: Positive for: Cataracts - surgery Constitutional Symptoms (General Health) Complaints and Symptoms: No Complaints or Symptoms HBO Extended History Items Eyes: Cataracts Immunizations Pneumococcal Vaccine: Received Pneumococcal Vaccination: Yes Implantable Devices Family and Social History Cancer: No; Diabetes: Yes - Siblings; Heart Disease: No; Hereditary Spherocytosis:  No; Hypertension: Yes - Mother; Kidney Disease: No; Lung Disease: No; Seizures: No; Stroke: Yes - Maternal Grandparents; Thyroid Problems: No; Tuberculosis: No; Never smoker; Marital Status - Married; Alcohol Use: Never; Drug Use: No History; Caffeine Use: Rarely; Financial Concerns: No; Food, Clothing or Shelter Needs: No; Support System Lacking: No; Transportation Concerns: No; Advanced Directives: No; Patient does not want information on Advanced Directives; Do not resuscitate: No; Living Will: No; Medical Power of Attorney: No Electronic Signature(s) Signed: 09/15/2017 5:20:26 PM By: Wynona Dove ([id number]) Signed: 09/15/2017 5:29:04 PM By: Linton Ham MD Entered By: Alric Quan on 09/15/2017 11:08:25 Ariana [name] ([id number]) -------------------------------------------------------------------------------- SuperBill Details Patient Name:  Ariana [name] Date of Service: 09/15/2017 Medical Record Number: [id number] Patient Account Number: [id number] Date of Birth/Sex: Mar 07, 1933 (84 y.o. 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

## 2017-09-22 ENCOUNTER — Encounter: Payer: Medicare HMO | Admitting: Internal Medicine

## 2017-09-22 DIAGNOSIS — S8012XD Contusion of left lower leg, subsequent encounter: Secondary | ICD-10-CM | POA: Diagnosis not present

## 2017-09-22 DIAGNOSIS — D693 Immune thrombocytopenic purpura: Secondary | ICD-10-CM | POA: Diagnosis not present

## 2017-09-22 DIAGNOSIS — S81801A Unspecified open wound, right lower leg, initial encounter: Secondary | ICD-10-CM | POA: Diagnosis not present

## 2017-09-22 DIAGNOSIS — S8011XD Contusion of right lower leg, subsequent encounter: Secondary | ICD-10-CM | POA: Diagnosis not present

## 2017-09-22 DIAGNOSIS — Z885 Allergy status to narcotic agent status: Secondary | ICD-10-CM | POA: Diagnosis not present

## 2017-09-23 DIAGNOSIS — S8012XD Contusion of left lower leg, subsequent encounter: Secondary | ICD-10-CM | POA: Diagnosis not present

## 2017-09-23 DIAGNOSIS — S8011XD Contusion of right lower leg, subsequent encounter: Secondary | ICD-10-CM | POA: Diagnosis not present

## 2017-09-23 DIAGNOSIS — M5136 Other intervertebral disc degeneration, lumbar region: Secondary | ICD-10-CM | POA: Diagnosis not present

## 2017-09-23 DIAGNOSIS — D693 Immune thrombocytopenic purpura: Secondary | ICD-10-CM | POA: Diagnosis not present

## 2017-09-23 DIAGNOSIS — J45909 Unspecified asthma, uncomplicated: Secondary | ICD-10-CM | POA: Diagnosis not present

## 2017-09-23 DIAGNOSIS — M5030 Other cervical disc degeneration, unspecified cervical region: Secondary | ICD-10-CM | POA: Diagnosis not present

## 2017-09-23 DIAGNOSIS — F0390 Unspecified dementia without behavioral disturbance: Secondary | ICD-10-CM | POA: Diagnosis not present

## 2017-09-23 DIAGNOSIS — I10 Essential (primary) hypertension: Secondary | ICD-10-CM | POA: Diagnosis not present

## 2017-09-23 DIAGNOSIS — I4891 Unspecified atrial fibrillation: Secondary | ICD-10-CM | POA: Diagnosis not present

## 2017-09-24 DIAGNOSIS — J45909 Unspecified asthma, uncomplicated: Secondary | ICD-10-CM | POA: Diagnosis not present

## 2017-09-24 DIAGNOSIS — F0390 Unspecified dementia without behavioral disturbance: Secondary | ICD-10-CM | POA: Diagnosis not present

## 2017-09-24 DIAGNOSIS — I10 Essential (primary) hypertension: Secondary | ICD-10-CM | POA: Diagnosis not present

## 2017-09-24 DIAGNOSIS — D693 Immune thrombocytopenic purpura: Secondary | ICD-10-CM | POA: Diagnosis not present

## 2017-09-24 DIAGNOSIS — S8012XD Contusion of left lower leg, subsequent encounter: Secondary | ICD-10-CM | POA: Diagnosis not present

## 2017-09-24 DIAGNOSIS — M5136 Other intervertebral disc degeneration, lumbar region: Secondary | ICD-10-CM | POA: Diagnosis not present

## 2017-09-24 DIAGNOSIS — I4891 Unspecified atrial fibrillation: Secondary | ICD-10-CM | POA: Diagnosis not present

## 2017-09-24 DIAGNOSIS — S8011XD Contusion of right lower leg, subsequent encounter: Secondary | ICD-10-CM | POA: Diagnosis not present

## 2017-09-24 DIAGNOSIS — M5030 Other cervical disc degeneration, unspecified cervical region: Secondary | ICD-10-CM | POA: Diagnosis not present

## 2017-09-24 NOTE — Progress Notes (Signed)
[name], [name] ([id number]) Visit Report for 09/15/2017 Allergy List Details Patient Name: Ariana [name], Ariana [name]. Date of Service: 09/15/2017 10:30 AM Medical Record Number: [id number] Patient Account Number: [id number] Date of Birth/Sex: Jul 14, 1933 (83 y.o. Female) Treating RN: Carolyne Fiscal, Debi Primary Care Rejina Odle: Fulton Reek Other Clinician: Referring Eduarda Scrivens: Fulton Reek Treating Justyce Baby/Extender: Ricard Dillon Weeks in Treatment: 0 Allergies Active Allergies aspirin diazepam morphine tetanus and diphtheria toxoids, adsorbed, adult Allergy Notes Electronic Signature(s) Signed: 09/15/2017 5:20:26 PM By: Alric Quan Entered By: Alric Quan on 09/15/2017 11:03:48 Ariana [name] ([id number]) -------------------------------------------------------------------------------- Arrival Information Details Patient Name: Ariana [name]. Date of Service: 09/15/2017 10:30 AM Medical Record Number: [id number] Patient Account Number: [id number] Date of Birth/Sex: 12/25/1932 (83 y.o. Female) Treating RN: Carolyne Fiscal, Debi Primary Care Leavy Heatherly: Fulton Reek Other Clinician: Referring Emily Massar: Fulton Reek Treating Tiara Maultsby/Extender: Tito Dine in Treatment: 0 Visit Information Patient Arrived: Ambulatory Arrival Time: 10:55 Accompanied By: son Transfer Assistance: None Patient Identification Verified: Yes Secondary Verification Process Completed: Yes Patient Requires Transmission-Based No Precautions: Patient Has Alerts: No Electronic Signature(s) Signed: 09/15/2017 5:20:26 PM By: Alric Quan Entered By: Alric Quan on 09/15/2017 10:57:47 Ariana [name] ([id number]) -------------------------------------------------------------------------------- Clinic Level of Care Assessment Details Patient Name: Ariana [name] Date of Service: 09/15/2017 10:30 AM Medical Record Number: [id number] Patient Account Number:  [id number] Date of Birth/Sex: May 05, 1933 (83 y.o. Female) Treating RN: Carolyne Fiscal, Debi Primary Care Lynel Forester: Fulton Reek Other Clinician: Referring Natahlia Hoggard: Fulton Reek Treating Lalia Loudon/Extender: Tito Dine in Treatment: 0 Clinic Level of Care Assessment Items TOOL 2 Quantity Score X - Use when only an EandM is performed on the INITIAL visit 1 0 ASSESSMENTS - Nursing Assessment / Reassessment X - General Physical Exam (combine w/ comprehensive assessment (listed just below) when 1 20 performed on new pt. evals) X- 1 25 Comprehensive Assessment (HX, ROS, Risk Assessments, Wounds Hx, etc.) ASSESSMENTS - Wound and Skin Assessment / Reassessment []  - Simple Wound Assessment / Reassessment - one wound 0 X- 2 5 Complex Wound Assessment / Reassessment - multiple wounds []  - 0 Dermatologic / Skin Assessment (not related to wound area) ASSESSMENTS - Ostomy and/or Continence Assessment and Care []  - Incontinence Assessment and Management 0 []  - 0 Ostomy Care Assessment and Management (repouching, etc.) PROCESS - Coordination of Care []  - Simple Patient / Family Education for ongoing care 0 X- 1 20 Complex (extensive) Patient / Family Education for ongoing care X- 1 10 Staff obtains Programmer, systems, Records, Test Results / Process Orders X- 1 10 Staff telephones HHA, Nursing Homes / Clarify orders / etc []  - 0 Routine Transfer to another Facility (non-emergent condition) []  - 0 Routine Hospital Admission (non-emergent condition) X- 1 15 New Admissions / Biomedical engineer / Ordering NPWT, Apligraf, etc. []  - 0 Emergency Hospital Admission (emergent condition) X- 1 10 Simple Discharge Coordination []  - 0 Complex (extensive) Discharge Coordination PROCESS - Special Needs []  - Pediatric / Minor Patient Management 0 []  - 0 Isolation Patient Management CITLALLI, WEIKEL. ([id number]) []  - 0 Hearing / Language / Visual special needs []  - 0 Assessment of  Community assistance (transportation, D/C planning, etc.) []  - 0 Additional assistance / Altered mentation []  - 0 Support Surface(s) Assessment (bed, cushion, seat, etc.) INTERVENTIONS - Wound Cleansing / Measurement X - Wound Imaging (photographs - any number of wounds) 1 5 []  - 0 Wound Tracing (instead of photographs) []  - 0 Simple Wound Measurement - one wound X- 2 5 Complex Wound Measurement - multiple wounds []  -  0 Simple Wound Cleansing - one wound X- 2 5 Complex Wound Cleansing - multiple wounds INTERVENTIONS - Wound Dressings X - Small Wound Dressing one or multiple wounds 2 10 []  - 0 Medium Wound Dressing one or multiple wounds []  - 0 Large Wound Dressing one or multiple wounds []  - 0 Application of Medications - injection INTERVENTIONS - Miscellaneous []  - External ear exam 0 []  - 0 Specimen Collection (cultures, biopsies, blood, body fluids, etc.) []  - 0 Specimen(s) / Culture(s) sent or taken to Lab for analysis []  - 0 Patient Transfer (multiple staff / Civil Service fast streamer / Similar devices) []  - 0 Simple Staple / Suture removal (25 or less) []  - 0 Complex Staple / Suture removal (26 or more) []  - 0 Hypo / Hyperglycemic Management (close monitor of Blood Glucose) X- 1 15 Ankle / Brachial Index (ABI) - do not check if billed separately Has the patient been seen at the hospital within the last three years: Yes Total Score: 180 Level Of Care: New/Established - Level 5 Electronic Signature(s) Signed: 09/15/2017 5:20:26 PM By: Alric Quan Entered By: Alric Quan on 09/15/2017 15:21:54 Ariana [name] ([id number]) -------------------------------------------------------------------------------- Encounter Discharge Information Details Patient Name: Ariana [name]. Date of Service: 09/15/2017 10:30 AM Medical Record Number: [id number] Patient Account Number: [id number] Date of Birth/Sex: [birth year] (83 y.o. Female) Treating RN: Carolyne Fiscal, Debi Primary  Care Rickia Freeburg: Fulton Reek Other Clinician: Referring Peja Allender: Fulton Reek Treating Caeden Foots/Extender: Tito Dine in Treatment: 0 Encounter Discharge Information Items Discharge Pain Level: 0 Discharge Condition: Stable Ambulatory Status: Ambulatory Discharge Destination: Home Transportation: Private Auto Accompanied By: son Schedule Follow-up Appointment: Yes Medication Reconciliation completed and No provided to Patient/Care Riah Kehoe: Provided on Clinical Summary of Care: 09/15/2017 Form Type Recipient Paper Patient BI Electronic Signature(s) Signed: 09/22/2017 9:14:59 AM By: Ruthine Dose Entered By: Ruthine Dose on 09/15/2017 12:11:25 Ariana [name] ([id number]) -------------------------------------------------------------------------------- Lower Extremity Assessment Details Patient Name: Ariana [name]. Date of Service: 09/15/2017 10:30 AM Medical Record Number: [id number] Patient Account Number: [id number] Date of Birth/Sex: [birth year] (83 y.o. Female) Treating RN: Carolyne Fiscal, Debi Primary Care Sedonia Kitner: Fulton Reek Other Clinician: Referring Laithan Conchas: Fulton Reek Treating Tommie Dejoseph/Extender: Tito Dine in Treatment: 0 Vascular Assessment Pulses: Dorsalis Pedis Palpable: [Left:Yes] [Right:Yes] Posterior Tibial Extremity colors, hair growth, and conditions: Extremity Color: [Left:Mottled] [Right:Mottled] Hair Growth on Extremity: [Left:No] [Right:No] Temperature of Extremity: [Left:Warm] [Right:Warm] Capillary Refill: [Left:> 3 seconds] [Right:> 3 seconds] Blood Pressure: Brachial: [Left:132] Dorsalis Pedis: 104 [Left:Dorsalis Pedis:] Ankle: Posterior Tibial: 80 [Left:Posterior Tibial: 0.79] Toe Nail Assessment Left: Right: Thick: No No Discolored: No No Deformed: No No Improper Length and Hygiene: No No Notes unable to get ABI in right leg d/t location of hematoma Electronic Signature(s) Signed:  09/15/2017 5:20:26 PM By: Alric Quan Entered By: Alric Quan on 09/15/2017 11:21:44 Ariana [name] ([id number]) -------------------------------------------------------------------------------- Multi Wound Chart Details Patient Name: Ariana [name]. Date of Service: 09/15/2017 10:30 AM Medical Record Number: [id number] Patient Account Number: [id number] Date of Birth/Sex: 1933-04-23 (83 y.o. Female) Treating RN: Carolyne Fiscal, Debi Primary Care Teyton Pattillo: Fulton Reek Other Clinician: Referring Hillis Mcphatter: Fulton Reek Treating Mekel Haverstock/Extender: Tito Dine in Treatment: 0 Vital Signs Height(in): 65 Pulse(bpm): 84 Weight(lbs): 133.4 Blood Pressure(mmHg): 99/79 Body Mass Index(BMI): 22 Temperature(F): 97.6 Respiratory Rate 18 (breaths/min): Photos: [1:No Photos] [2:No Photos] [N/A:N/A] Wound Location: [1:Left, Lateral Lower Leg] [2:Right Lower Leg - Medial] [N/A:N/A] Wounding Event: [1:Trauma] [2:Trauma] [N/A:N/A] Primary Etiology: [1:Trauma, Other] [2:Trauma, Other] [N/A:N/A] Comorbid History: [1:Cataracts] [2:Cataracts] [N/A:N/A] Date Acquired: [1:08/25/2017] [  2:08/25/2017] [N/A:N/A] Weeks of Treatment: [1:0] [2:0] [N/A:N/A] Wound Status: [1:Open] [2:Open] [N/A:N/A] Measurements L x W x D [1:4.9x3.1x0.1] [2:4.5x3.7x0.1] [N/A:N/A] (cm) Area (cm) : [1:11.93] [2:13.077] [N/A:N/A] Volume (cm) : [1:1.193] [2:1.308] [N/A:N/A] Classification: [1:Partial Thickness] [2:Partial Thickness] [N/A:N/A] Exudate Amount: [1:Large] [2:None Present] [N/A:N/A] Exudate Type: [1:Serosanguineous] [2:N/A] [N/A:N/A] Exudate Color: [1:red, brown] [2:N/A] [N/A:N/A] Wound Margin: [1:Distinct, outline attached] [2:Distinct, outline attached] [N/A:N/A] Granulation Amount: [1:Medium (34-66%)] [2:None Present (0%)] [N/A:N/A] Granulation Quality: [1:Red] [2:N/A] [N/A:N/A] Necrotic Amount: [1:Medium (34-66%)] [2:Large (67-100%)] [N/A:N/A] Necrotic Tissue: [1:Adherent  Slough] [2:Eschar] [N/A:N/A] Exposed Structures: [1:Fascia: No Fat Layer (Subcutaneous Tissue) Exposed: No Tendon: No Muscle: No Joint: No Bone: No] [2:Fascia: No Fat Layer (Subcutaneous Tissue) Exposed: No Tendon: No Muscle: No Joint: No Bone: No] [N/A:N/A] Epithelialization: [1:None] [2:None] [N/A:N/A] Periwound Skin Texture: [1:No Abnormalities Noted] [2:No Abnormalities Noted] [N/A:N/A] Periwound Skin Moisture: [1:No Abnormalities Noted] [2:No Abnormalities Noted] [N/A:N/A] Periwound Skin Color: [1:Erythema: Yes] [2:No Abnormalities Noted] [N/A:N/A] Erythema Location: [1:Circumferential] [2:N/A] [N/A:N/A] Temperature: [1:N/A] [2:No Abnormality] [N/A:N/A] Tenderness on Palpation: [1:No] [2:No] [N/A:N/A] Wound Preparation: [1:Ulcer Cleansing: Rinsed/Irrigated with Saline] [2:Ulcer Cleansing: Rinsed/Irrigated with Saline] [N/A:N/A] Topical Anesthetic Applied: Topical Anesthetic Applied: Other: lidocaine 4% Other: lidocaine 4% Treatment Notes Electronic Signature(s) Signed: 09/15/2017 5:29:04 PM By: Linton Ham MD Entered By: Linton Ham on 09/15/2017 12:53:12 Ariana [name] ([id number]) -------------------------------------------------------------------------------- Multi-Disciplinary Care Plan Details Patient Name: Ariana [name] Date of Service: 09/15/2017 10:30 AM Medical Record Number: [id number] Patient Account Number: [id number] Date of Birth/Sex: 1933/02/03 (83 y.o. Female) Treating RN: Carolyne Fiscal, Debi Primary Care Alaa Mullally: Fulton Reek Other Clinician: Referring Laberta Wilbon: Fulton Reek Treating Kellyn Mansfield/Extender: Tito Dine in Treatment: 0 Active Inactive ` Abuse / Safety / Falls / Self Care Management Nursing Diagnoses: History of Falls Potential for falls Goals: Patient will not experience any injury related to falls Date Initiated: 09/15/2017 Target Resolution Date: 01/15/2018 Goal Status: Active Interventions: Assess Activities  of Daily Living upon admission and as needed Assess fall risk on admission and as needed Assess: immobility, friction, shearing, incontinence upon admission and as needed Assess impairment of mobility on admission and as needed per policy Assess personal safety and home safety (as indicated) on admission and as needed Assess self care needs on admission and as needed Notes: ` Nutrition Nursing Diagnoses: Imbalanced nutrition Potential for alteratiion in Nutrition/Potential for imbalanced nutrition Goals: Patient/caregiver agrees to and verbalizes understanding of need to use nutritional supplements and/or vitamins as prescribed Date Initiated: 09/15/2017 Target Resolution Date: 01/15/2018 Goal Status: Active Interventions: Assess patient nutrition upon admission and as needed per policy Notes: ` Orientation to the Wound Care Program Nursing Diagnoses: ALONNAH, LAMPKINS ([id number]) Knowledge deficit related to the wound healing center program Goals: Patient/caregiver will verbalize understanding of the [facility] Program Date Initiated: 09/15/2017 Target Resolution Date: 01/15/2018 Goal Status: Active Interventions: Provide education on orientation to the wound center Notes: ` Pain, Acute or Chronic Nursing Diagnoses: Pain, acute or chronic: actual or potential Potential alteration in comfort, pain Goals: Patient/caregiver will verbalize adequate pain control between visits Date Initiated: 09/15/2017 Target Resolution Date: 01/15/2018 Goal Status: Active Interventions: Complete pain assessment as per visit requirements Notes: ` Wound/Skin Impairment Nursing Diagnoses: Impaired tissue integrity Knowledge deficit related to ulceration/compromised skin integrity Goals: Ulcer/skin breakdown will have a volume reduction of 80% by week 12 Date Initiated: 09/15/2017 Target Resolution Date: 01/15/2018 Goal Status: Active Interventions: Assess patient/caregiver ability  to perform ulcer/skin care regimen upon admission and as needed Assess ulceration(s) every visit Notes: Electronic Signature(s) Signed: 09/15/2017  5:20:26 PM By: Alric Quan Entered By: Alric Quan on 09/15/2017 11:57:21 Ariana [name] ([id number]) -------------------------------------------------------------------------------- Pain Assessment Details Patient Name: Ariana [name] Date of Service: 09/15/2017 10:30 AM Medical Record Number: [id number] Patient Account Number: [id number] Date of Birth/Sex: 18-May-1933 (83 y.o. Female) Treating RN: Carolyne Fiscal, Debi Primary Care Jennilee Demarco: Fulton Reek Other Clinician: Referring Samyia Motter: Fulton Reek Treating Keosha Rossa/Extender: Tito Dine in Treatment: 0 Active Problems Location of Pain Severity and Description of Pain Patient Has Paino Yes Site Locations Pain Location: Pain in Ulcers Rate the pain. Current Pain Level: 2 Character of Pain Describe the Pain: Aching, Tender Pain Management and Medication Current Pain Management: Electronic Signature(s) Signed: 09/15/2017 5:20:26 PM By: Alric Quan Entered By: Alric Quan on 09/15/2017 10:58:57 Ariana [name] ([id number]) -------------------------------------------------------------------------------- Patient/Caregiver Education Details Patient Name: Ariana [name] Date of Service: 09/15/2017 10:30 AM Medical Record Number: [id number] Patient Account Number: [id number] Date of Birth/Gender: 1933/06/25 (83 y.o. Female) Treating RN: Carolyne Fiscal, Debi Primary Care Physician: Fulton Reek Other Clinician: Referring Physician: Fulton Reek Treating Physician/Extender: Tito Dine in Treatment: 0 Education Assessment Education Provided To: Patient Education Topics Provided Welcome To The [place]: Handouts: Welcome To [facility] [place] Methods: Explain/Verbal Responses: State content  correctly Wound/Skin Impairment: Handouts: Caring for Your Ulcer, Other: change dressing as ordered Methods: Demonstration, Explain/Verbal Responses: State content correctly Electronic Signature(s) Signed: 09/15/2017 5:20:26 PM By: Alric Quan Entered By: Alric Quan on 09/15/2017 11:58:38 Ariana [name] ([id number]) -------------------------------------------------------------------------------- Wound Assessment Details Patient Name: Ariana [name]. Date of Service: 09/15/2017 10:30 AM Medical Record Number: [id number] Patient Account Number: [id number] Date of Birth/Sex: 11/26/1932 (83 y.o. Female) Treating RN: Carolyne Fiscal, Debi Primary Care Usbaldo Pannone: Fulton Reek Other Clinician: Referring Eren Ryser: Fulton Reek Treating Maleeyah Mccaughey/Extender: Tito Dine in Treatment: 0 Wound Status Wound Number: 1 Primary Etiology: Trauma, Other Wound Location: Left, Lateral Lower Leg Wound Status: Open Wounding Event: Trauma Comorbid History: Cataracts Date Acquired: 08/25/2017 Weeks Of Treatment: 0 Clustered Wound: No Photos Photo Uploaded By: Alric Quan on 09/15/2017 15:25:50 Wound Measurements Length: (cm) 4.9 Width: (cm) 3.1 Depth: (cm) 0.1 Area: (cm) 11.93 Volume: (cm) 1.193 % Reduction in Area: % Reduction in Volume: Epithelialization: None Tunneling: No Undermining: No Wound Description Classification: Partial Thickness Wound Margin: Distinct, outline attached Exudate Amount: Large Exudate Type: Serosanguineous Exudate Color: red, brown Foul Odor After Cleansing: No Slough/Fibrino Yes Wound Bed Granulation Amount: Medium (34-66%) Exposed Structure Granulation Quality: Red Fascia Exposed: No Necrotic Amount: Medium (34-66%) Fat Layer (Subcutaneous Tissue) Exposed: No Necrotic Quality: Adherent Slough Tendon Exposed: No Muscle Exposed: No Joint Exposed: No Bone Exposed: No Periwound Skin Texture Maranan, Danyeal E.  ([id number]) Texture Color No Abnormalities Noted: No No Abnormalities Noted: No Erythema: Yes Moisture Erythema Location: Circumferential No Abnormalities Noted: No Wound Preparation Ulcer Cleansing: Rinsed/Irrigated with Saline Topical Anesthetic Applied: Other: lidocaine 4%, Electronic Signature(s) Signed: 09/15/2017 5:20:26 PM By: Alric Quan Entered By: Alric Quan on 09/15/2017 11:25:37 Ariana [name] ([id number]) -------------------------------------------------------------------------------- Wound Assessment Details Patient Name: Ariana [name] Date of Service: 09/15/2017 10:30 AM Medical Record Number: [id number] Patient Account Number: [id number] Date of Birth/Sex: 04-20-1933 (83 y.o. Female) Treating RN: Carolyne Fiscal, Debi Primary Care Shirley Decamp: Fulton Reek Other Clinician: Referring Mihika Surrette: Fulton Reek Treating Zineb Glade/Extender: Ricard Dillon Weeks in Treatment: 0 Wound Status Wound Number: 2 Primary Etiology: Trauma, Other Wound Location: Right Lower Leg - Medial Wound Status: Open Wounding Event: Trauma Comorbid History: Cataracts Date Acquired: 08/25/2017 Weeks Of Treatment: 0 Clustered Wound: No  Photos Photo Uploaded By: Alric Quan on 09/15/2017 15:25:50 Wound Measurements Length: (cm) 4.5 Width: (cm) 3.7 Depth: (cm) 0.1 Area: (cm) 13.077 Volume: (cm) 1.308 % Reduction in Area: % Reduction in Volume: Epithelialization: None Tunneling: No Undermining: No Wound Description Classification: Partial Thickness Wound Margin: Distinct, outline attached Exudate Amount: None Present Foul Odor After Cleansing: No Wound Bed Granulation Amount: None Present (0%) Exposed Structure Necrotic Amount: Large (67-100%) Fascia Exposed: No Necrotic Quality: Eschar Fat Layer (Subcutaneous Tissue) Exposed: No Tendon Exposed: No Muscle Exposed: No Joint Exposed: No Bone Exposed: No Periwound Skin Texture Texture Color No  Abnormalities Noted: No No Abnormalities Noted: No Kroeger, Raiana E. ([id number]) Moisture Temperature / Pain No Abnormalities Noted: No Temperature: No Abnormality Wound Preparation Ulcer Cleansing: Rinsed/Irrigated with Saline Topical Anesthetic Applied: Other: lidocaine 4%, Electronic Signature(s) Signed: 09/15/2017 5:20:26 PM By: Alric Quan Entered By: Alric Quan on 09/15/2017 11:29:08 Ariana [name] ([id number]) -------------------------------------------------------------------------------- Vitals Details Patient Name: Ariana [name] Date of Service: 09/15/2017 10:30 AM Medical Record Number: [id number] Patient Account Number: [id number] Date of Birth/Sex: 06/03/1933 (83 y.o. Female) Treating RN: Carolyne Fiscal, Debi Primary Care Page Pucciarelli: Fulton Reek Other Clinician: Referring Heiley Shaikh: Fulton Reek Treating Inocencia Murtaugh/Extender: Tito Dine in Treatment: 0 Vital Signs Time Taken: 10:59 Temperature (F): 97.6 Height (in): 65 Pulse (bpm): 62 Source: Stated Respiratory Rate (breaths/min): 18 Weight (lbs): 133.4 Blood Pressure (mmHg): 99/79 Source: Measured Reference Range: 80 - 120 mg / dl Body Mass Index (BMI): 22.2 Electronic Signature(s) Signed: 09/15/2017 5:20:26 PM By: Alric Quan Entered By: Alric Quan on 09/15/2017 11:00:47

## 2017-09-25 NOTE — Progress Notes (Signed)
Ariana [name], Ariana [name] ([id number]) Visit Report for 09/22/2017 Debridement Details Patient Name: Ariana [name], Ariana [name]. Date of Service: 09/22/2017 2:15 PM Medical Record Number: [id number] Patient Account Number: [id number] Date of Birth/Sex: [birth year] (84 y.o. Female) Treating RN: Carolyne Fiscal, Debi Primary Care Provider: Fulton Reek Other Clinician: Referring Provider: Fulton Reek Treating Provider/Extender: Tito Dine in Treatment: 1 Debridement Performed for Wound #2 Right,Medial Lower Leg Assessment: Performed By: Physician Ricard Dillon, MD Debridement: Debridement Pre-procedure Verification/Time Yes - 14:57 Out Taken: Start Time: 14:58 Pain Control: Lidocaine 4% Topical Solution Level: Skin/Subcutaneous Tissue Total Area Debrided (L x W): 3.5 (cm) x 3 (cm) = 10.5 (cm) Tissue and other material Viable, Non-Viable, Exudate, Fibrin/Slough, Subcutaneous debrided: Instrument: Blade, Forceps, Other : scoop Bleeding: Minimum Hemostasis Achieved: Pressure End Time: 15:01 Procedural Pain: 0 Post Procedural Pain: 0 Response to Treatment: Procedure was tolerated well Post Debridement Measurements of Total Wound Length: (cm) 3.7 Width: (cm) 3.1 Depth: (cm) 0.3 Volume: (cm) 2.703 Character of Wound/Ulcer Post Debridement: Requires Further Debridement Post Procedure Diagnosis Same as Pre-procedure Electronic Signature(s) Signed: 09/22/2017 4:44:53 PM By: Alric Quan Signed: 09/22/2017 5:01:55 PM By: Linton Ham MD Entered By: Linton Ham on 09/22/2017 15:33:10 Ariana [name] ([id number]) -------------------------------------------------------------------------------- HPI Details Patient Name: Ariana [name] Date of Service: 09/22/2017 2:15 PM Medical Record Number: [id number] Patient Account Number: [id number] Date of Birth/Sex: [birth year] (84 y.o. Female) Treating RN: Carolyne Fiscal, Debi Primary Care Provider: Fulton Reek  Other Clinician: Referring Provider: Fulton Reek Treating Provider/Extender: Tito Dine in Treatment: 1 History of Present Illness HPI Description: 09/15/17; this is an 83 year old woman with a history of type 2 diabetes, chronic ITP for which she follows with oncology. She had a splenectomy in 2007. 3 weeks ago well sitting in the bathroom she fell off the platelet and hit her legs on the bathtub. She had 2 large hematomas. The area on the left has ruptured into a wound where the area on the right medial lower leg is actually still intact. She was given Keflex of concern for coexistent cellulitis on 11/28 the area on the right is still intact and she is not doing anything specific for that. She has a history of small hematomas related to thrombocytopenia on her lower legs although these all seem to have primarily healed. She has a history of falls 09/22/17; this is a patient who had traumatic hematomas on both her legs. When we admitted to clinic last week the left one had already evacuated whereas the right one had not. She arrived in clinic today with drainage out of the right side. We used Hydrofera Blue to the left Electronic Signature(s) Signed: 09/22/2017 5:01:55 PM By: Linton Ham MD Entered By: Linton Ham on 09/22/2017 15:33:57 Ariana [name] ([id number]) -------------------------------------------------------------------------------- Physical Exam Details Patient Name: Ariana [name] Date of Service: 09/22/2017 2:15 PM Medical Record Number: [id number] Patient Account Number: [id number] Date of Birth/Sex: [birth year] (84 y.o. Female) Treating RN: Ahmed Prima Primary Care Provider: Fulton Reek Other Clinician: Referring Provider: Fulton Reek Treating Provider/Extender: Tito Dine in Treatment: 1 Constitutional Sitting or standing Blood Pressure is within target range for patient.. Pulse regular and within target range  for patient.Marland Kitchen Respirations regular, non-labored and within target range.. Temperature is normal and within the target range for the patient.Marland Kitchen appears in no distress. Notes 09/22/17; wound exam the patient has a large area on the left anterior leg which is really healthy granulation and advancing epithelialization. There is no evidence of surrounding infection oThe  area on the right lower leg still has an intact hematoma last week however it was open and leaking this week. Using scalpel and pickups I removed the surface and then using a curette removed gelatinous old blood. Then continuing with a curette the base of the wound was debridement of necrotic subcutaneous tissue. Hemostasis with direct pressure Electronic Signature(s) Signed: 09/22/2017 5:01:55 PM By: Linton Ham MD Entered By: Linton Ham on 09/22/2017 15:35:47 Ariana [name] ([id number]) -------------------------------------------------------------------------------- Physician Orders Details Patient Name: Ariana [name] Date of Service: 09/22/2017 2:15 PM Medical Record Number: [id number] Patient Account Number: [id number] Date of Birth/Sex: [birth year] (84 y.o. Female) Treating RN: Carolyne Fiscal, Debi Primary Care Provider: Fulton Reek Other Clinician: Referring Provider: Fulton Reek Treating Provider/Extender: Tito Dine in Treatment: 1 Verbal / Phone Orders: Yes Clinician: [place], Debi Read Back and Verified: Yes Diagnosis Coding Wound Cleansing Wound #1 Left,Lateral Lower Leg o Clean wound with Normal Saline. o Cleanse wound with mild soap and water o May Shower, gently pat wound dry prior to applying new dressing. Wound #2 Right,Medial Lower Leg o Clean wound with Normal Saline. o Cleanse wound with mild soap and water o May Shower, gently pat wound dry prior to applying new dressing. Anesthetic (add to Medication List) Wound #1 Left,Lateral Lower Leg o Topical  Lidocaine 4% cream applied to wound bed prior to debridement (In Clinic Only). Wound #2 Right,Medial Lower Leg o Topical Lidocaine 4% cream applied to wound bed prior to debridement (In Clinic Only). Skin Barriers/Peri-Wound Care Wound #1 Left,Lateral Lower Leg o Skin Prep o Barrier cream Wound #2 Right,Medial Lower Leg o Skin Prep o Barrier cream Primary Wound Dressing Wound #1 Left,Lateral Lower Leg o Hydrafera Blue o Other: - adaptic Wound #2 Right,Medial Lower Leg o Hydrafera Blue o Other: - adaptic Secondary Dressing Wound #1 Left,Lateral Lower Leg o Boardered Foam Dressing Wound #2 Right,Medial Lower Leg o Boardered Foam Dressing MAXCINE, STRONG ([id number]) Dressing Change Frequency Wound #1 Left,Lateral Lower Leg o Change Dressing Monday, Wednesday, Friday - HHRN to change dressing to change dressing Mondays and Fridays and pt will come to [facility] on Wednesdays. Wound #2 Right,Medial Lower Leg o Change Dressing Monday, Wednesday, Friday - HHRN to change dressing to change dressing Mondays and Fridays and pt will come to [facility] on Wednesdays. Edema Control Wound #1 Left,Lateral Lower Leg o Elevate legs to the level of the heart and pump ankles as often as possible Wound #2 Right,Medial Lower Leg o Elevate legs to the level of the heart and pump ankles as often as possible Additional Orders / Instructions Wound #1 Left,Lateral Lower Leg o Increase protein intake. Wound #2 Right,Medial Lower Leg o Increase protein intake. Home Health Wound #1 [place] Nurse may visit PRN to address patientos wound care needs. o FACE TO FACE ENCOUNTER: MEDICARE and MEDICAID PATIENTS: I certify that this patient is under my care and that I had a face-to-face encounter that meets the physician face-to-face encounter requirements with this patient on this date. The  encounter with the patient was in whole or in part for the following MEDICAL CONDITION: (primary reason for [place]) MEDICAL NECESSITY: I certify, that based on my findings, NURSING services are a medically necessary home health service. HOME BOUND STATUS: I certify that my clinical findings support that this patient is homebound (i.e., Due to illness or injury, pt requires aid of supportive devices such as crutches,  cane, wheelchairs, walkers, the use of special transportation or the assistance of another person to leave their place of residence. There is a normal inability to leave the home and doing so requires considerable and taxing effort. Other absences are for medical reasons / religious services and are infrequent or of short duration when for other reasons). o If current dressing causes regression in wound condition, may D/C ordered dressing product/s and apply Normal Saline Moist Dressing daily until next [place] / Other MD appointment. [place] of regression in wound condition at [phone]. o Please direct any NON-WOUND related issues/requests for orders to patient's Primary Care Physician Wound #2 [place] Visits o Home Health Nurse may visit PRN to address patientos wound care needs. o FACE TO FACE ENCOUNTER: MEDICARE and MEDICAID PATIENTS: I certify that this patient is under my care and that I had a face-to-face encounter that meets the physician face-to-face encounter requirements with this patient on this date. The encounter with the patient was in whole or in part for the following MEDICAL CONDITION: (primary reason for [place]) MEDICAL NECESSITY: I certify, that based on my findings, NURSING services are a medically necessary home health service. HOME BOUND STATUS: I certify that my clinical findings support that this patient is homebound (i.e., Due to illness or injury, pt  requires aid of supportive devices such as crutches, cane, wheelchairs, walkers, the use of special transportation or the assistance of another person to leave their place of residence. There is a normal inability to leave the home and doing so requires considerable and taxing effort. Other absences are for medical reasons / religious services and are infrequent or of short duration when for other reasons). Ariana [name], Ariana [name] ([id number]) o If current dressing causes regression in wound condition, may D/C ordered dressing product/s and apply Normal Saline Moist Dressing daily until next [place] / Other MD appointment. [place] of regression in wound condition at [phone]. o Please direct any NON-WOUND related issues/requests for orders to patient's Primary Care Physician Patient Medications Allergies: aspirin, diazepam, morphine, tetanus and diphtheria toxoids, adsorbed, adult Notifications Medication Indication Start End lidocaine DOSE 1 - topical 4 % cream - 1 cream topical Electronic Signature(s) Signed: 09/22/2017 4:44:53 PM By: Alric Quan Signed: 09/22/2017 5:01:55 PM By: Linton Ham MD Entered By: Alric Quan on 09/22/2017 15:18:52 Ariana [name] ([id number]) -------------------------------------------------------------------------------- Prescription 09/22/2017 Patient Name: Ariana [name]. Provider: Ricard Dillon MD Date of Birth: [birth year] NPI#: [id number] Sex: F DEA#: [id number] Phone #: [phone] License #: [id number] Patient Address: [place], [place] [address] [address] [place], [place] [address] [phone] Allergies aspirin diazepam morphine tetanus and diphtheria toxoids, adsorbed, adult Medication Medication: Route: Strength: Form: lidocaine topical 4% cream Class: TOPICAL  LOCAL ANESTHETICS Dose: Frequency / Time: Indication: 1 1 cream topical Number of Refills: Number of Units: 0 Generic Substitution: Start Date: End Date: Administered at [place]: No Note to Pharmacy: Signature(s): Date(s): Ariana [name], Ariana [name] ([id number]) Electronic Signature(s) Signed: 09/22/2017 4:44:53 PM By: Alric Quan Signed: 09/22/2017 5:01:55 PM By: Linton Ham MD Entered By: Alric Quan on 09/22/2017 15:18:54 Ariana [name] ([id number]) --------------------------------------------------------------------------------  Problem List Details Patient Name: Ariana [name]. Date of Service: 09/22/2017 2:15 PM Medical Record Number: [id number] Patient Account Number: [id number] Date of Birth/Sex: September 24, 1933 (84 y.o. 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 Date of Service: 09/22/2017 2:15 PM Medical Record Number: [id number] Patient Account Number: [id number] Date of Birth/Sex: [birth year] (84 y.o. Female) Treating RN: Carolyne Fiscal, Debi Primary Care Provider: Fulton Reek Other Clinician: Referring Provider: Fulton Reek Treating Provider/Extender: Tito Dine in Treatment: 1 Subjective History of Present Illness (HPI) 09/15/17; this is an 83 year old woman with a history of type 2 diabetes, chronic ITP for which she follows with oncology. She had a splenectomy in 2007. 3 weeks ago well sitting in the bathroom she fell off the platelet and hit her legs on the bathtub. She had 2 large hematomas. The area on the left has ruptured into a wound where the area on the right medial lower leg is actually still intact. She was given Keflex of concern for coexistent cellulitis on 11/28 the area on the right is still intact and she is not doing anything specific for that. She has a history of small hematomas related to thrombocytopenia on her lower legs although these all seem to have primarily healed. She has a history of falls 09/22/17; this is a patient who had traumatic hematomas on both her legs. When we admitted to clinic last week the left one had already evacuated whereas the right one had not. She arrived in clinic today with drainage out of the right side. We used Hydrofera Blue to the left Objective Constitutional Sitting or standing Blood Pressure is within target range for patient.. Pulse regular and within target range for patient.Marland Kitchen Respirations regular, non-labored and within target range.. Temperature is normal and within the target range for the patient.Marland Kitchen appears in no distress. Vitals Time Taken: 2:37 PM, Height: 65 in, Weight: 133.4 lbs, BMI: 22.2, Temperature: 97.5 F, Pulse: 60 bpm, Respiratory Rate: 18 breaths/min, Blood Pressure: 113/79 mmHg. General Notes: 09/22/17; wound exam the patient has a large area on the left anterior leg which is really healthy granulation and advancing epithelialization. There is no evidence of surrounding infection The area on the right lower leg still has an intact hematoma last week however it was open and leaking this week. Using scalpel and pickups I removed the surface and then using a curette  removed gelatinous old blood. Then continuing with a curette the base of the wound was debridement of necrotic subcutaneous tissue. Hemostasis with direct pressure Integumentary (Hair, Skin) Wound #1 status is Open. Original cause of wound was Trauma. The wound is located on the Left,Lateral Lower Leg. The wound measures 4cm length x 3.5cm width x 0.1cm depth; 10.996cm^2 area and 1.1cm^3 volume. There is no tunneling or undermining noted. There is a large amount of serosanguineous drainage noted. The wound margin is distinct with the outline attached to the wound base. There is large (67-100%) red granulation within the wound bed. There is a small (1-33%) amount of necrotic tissue within the wound bed including Adherent Slough. The periwound skin appearance exhibited: Excoriation, Maceration, Erythema. The surrounding wound skin color is noted with erythema which is circumferential. Periwound temperature was  noted as No Abnormality. The periwound has tenderness on palpation. Wound #2 status is Open. Original cause of wound was Trauma. The wound is located on the Right,Medial Lower Leg. The Ariana [name], [name] ([id number]) wound measures 3.5cm length x 3cm width x 0.1cm depth; 8.247cm^2 area and 0.825cm^3 volume. There is no tunneling or undermining noted. There is a large amount of serosanguineous drainage noted. The wound margin is distinct with the outline attached to the wound base. There is no granulation within the wound bed. There is a large (67-100%) amount of necrotic tissue within the wound bed including Eschar. The periwound skin appearance exhibited: Maceration, Erythema. The surrounding wound skin color is noted with erythema which is circumferential. Periwound temperature was noted as No Abnormality. The periwound has tenderness on palpation. Assessment Active Problems ICD-10 S80.12XD - Contusion of left lower leg, subsequent encounter S80.11XD - Contusion of right lower leg,  subsequent encounter D69.3 - Immune thrombocytopenic purpura Procedures Wound #2 Pre-procedure diagnosis of Wound #2 is a Trauma, Other located on the Right,Medial Lower Leg . There was a Skin/Subcutaneous Tissue Debridement ([id number]) debridement with total area of 10.5 sq cm performed by Ricard Dillon, MD. with the following instrument(s): Blade, Forceps, scoop to remove Viable and Non-Viable tissue/material including Exudate, Fibrin/Slough, and Subcutaneous after achieving pain control using Lidocaine 4% Topical Solution. A time out was conducted at 14:57, prior to the start of the procedure. A Minimum amount of bleeding was controlled with Pressure. The procedure was tolerated well with a pain level of 0 throughout and a pain level of 0 following the procedure. Post Debridement Measurements: 3.7cm length x 3.1cm width x 0.3cm depth; 2.703cm^3 volume. Character of Wound/Ulcer Post Debridement requires further debridement. Post procedure Diagnosis Wound #2: Same as Pre-Procedure Plan Wound Cleansing: Wound #1 Left,Lateral Lower Leg: Clean wound with Normal Saline. Cleanse wound with mild soap and water May Shower, gently pat wound dry prior to applying new dressing. Wound #2 Right,Medial Lower Leg: Clean wound with Normal Saline. Cleanse wound with mild soap and water May Shower, gently pat wound dry prior to applying new dressing. Anesthetic (add to Medication List): Wound #1 Left,Lateral Lower Leg: Topical Lidocaine 4% cream applied to wound bed prior to debridement (In Clinic Only). Wound #2 Right,Medial Lower Leg: Topical Lidocaine 4% cream applied to wound bed prior to debridement (In Clinic Only). Ariana [name], Ariana [name] ([id number]) Skin Barriers/Peri-Wound Care: Wound #1 Left,Lateral Lower Leg: Skin Prep Barrier cream Wound #2 Right,Medial Lower Leg: Skin Prep Barrier cream Primary Wound Dressing: Wound #1 Left,Lateral Lower Leg: Hydrafera Blue Other: -  adaptic Wound #2 Right,Medial Lower Leg: Hydrafera Blue Other: - adaptic Secondary Dressing: Wound #1 Left,Lateral Lower Leg: Boardered Foam Dressing Wound #2 Right,Medial Lower Leg: Boardered Foam Dressing Dressing Change Frequency: Wound #1 Left,Lateral Lower Leg: Change Dressing Monday, Wednesday, Friday - HHRN to change dressing to change dressing Mondays and Fridays and pt will come to [facility] on Wednesdays. Wound #2 Right,Medial Lower Leg: Change Dressing Monday, Wednesday, Friday - HHRN to change dressing to change dressing Mondays and Fridays and pt will come to [facility] on Wednesdays. Edema Control: Wound #1 Left,Lateral Lower Leg: Elevate legs to the level of the heart and pump ankles as often as possible Wound #2 Right,Medial Lower Leg: Elevate legs to the level of the heart and pump ankles as often as possible Additional Orders / Instructions: Wound #1 Left,Lateral Lower Leg: Increase protein intake. Wound #2 Right,Medial Lower Leg: Increase protein intake. Home Health:  Wound #1 Left,Lateral Lower Leg: [place] Nurse may visit PRN to address patient s wound care needs. FACE TO FACE ENCOUNTER: MEDICARE and MEDICAID PATIENTS: I certify that this patient is under my care and that I had a face-to-face encounter that meets the physician face-to-face encounter requirements with this patient on this date. The encounter with the patient was in whole or in part for the following MEDICAL CONDITION: (primary reason for [place]) MEDICAL NECESSITY: I certify, that based on my findings, NURSING services are a medically necessary home health service. HOME BOUND STATUS: I certify that my clinical findings support that this patient is homebound (i.e., Due to illness or injury, pt requires aid of supportive devices such as crutches, cane, wheelchairs, walkers, the use of special transportation or the assistance of another person  to leave their place of residence. There is a normal inability to leave the home and doing so requires considerable and taxing effort. Other absences are for medical reasons / religious services and are infrequent or of short duration when for other reasons). If current dressing causes regression in wound condition, may D/C ordered dressing product/s and apply Normal Saline Moist Dressing daily until next [place] / Other MD appointment. [place] of regression in wound condition at [phone]. Please direct any NON-WOUND related issues/requests for orders to patient's Primary Care Physician Wound #2 Right,Medial Lower Leg: [place] Nurse may visit PRN to address patient s wound care needs. FACE TO FACE ENCOUNTER: MEDICARE and MEDICAID PATIENTS: I certify that this patient is under my care and that I had a face-to-face encounter that meets the physician face-to-face encounter requirements with this patient on this date. The encounter with the patient was in whole or in part for the following MEDICAL CONDITION: (primary reason for [place]) MEDICAL NECESSITY: I certify, that based on my findings, NURSING services are a medically necessary home Ariana [name], Ariana [name]. ([id number]) health service. HOME BOUND STATUS: I certify that my clinical findings support that this patient is homebound (i.e., Due to illness or injury, pt requires aid of supportive devices such as crutches, cane, wheelchairs, walkers, the use of special transportation or the assistance of another person to leave their place of residence. There is a normal inability to leave the home and doing so requires considerable and taxing effort. Other absences are for medical reasons / religious services and are infrequent or of short duration when for other reasons). If current dressing causes regression in wound condition, may D/C ordered dressing product/s and apply  Normal Saline Moist Dressing daily until next [place] / Other MD appointment. [place] of regression in wound condition at [phone]. Please direct any NON-WOUND related issues/requests for orders to patient's Primary Care Physician The following medication(s) was prescribed: lidocaine topical 4 % cream 1 1 cream topical #1 Hydrofera Blue to both wound areas #2border foam #3 she has home health changing the dressing Electronic Signature(s) Signed: 09/22/2017 5:01:55 PM By: Linton Ham MD Entered By: Linton Ham on 09/22/2017 15:37:49 Ariana [name] ([id number]) -------------------------------------------------------------------------------- [place] Details Patient Name: Ariana [name]. Date of Service: 09/22/2017 Medical Record Number: [id number] Patient Account Number: [id number] Date of Birth/Sex: 01/09/1933 (84 y.o. 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

## 2017-09-27 DIAGNOSIS — I10 Essential (primary) hypertension: Secondary | ICD-10-CM | POA: Diagnosis not present

## 2017-09-27 DIAGNOSIS — J45909 Unspecified asthma, uncomplicated: Secondary | ICD-10-CM | POA: Diagnosis not present

## 2017-09-27 DIAGNOSIS — D693 Immune thrombocytopenic purpura: Secondary | ICD-10-CM | POA: Diagnosis not present

## 2017-09-27 DIAGNOSIS — M5136 Other intervertebral disc degeneration, lumbar region: Secondary | ICD-10-CM | POA: Diagnosis not present

## 2017-09-27 DIAGNOSIS — M5030 Other cervical disc degeneration, unspecified cervical region: Secondary | ICD-10-CM | POA: Diagnosis not present

## 2017-09-27 DIAGNOSIS — S8012XD Contusion of left lower leg, subsequent encounter: Secondary | ICD-10-CM | POA: Diagnosis not present

## 2017-09-27 DIAGNOSIS — S8011XD Contusion of right lower leg, subsequent encounter: Secondary | ICD-10-CM | POA: Diagnosis not present

## 2017-09-27 DIAGNOSIS — I4891 Unspecified atrial fibrillation: Secondary | ICD-10-CM | POA: Diagnosis not present

## 2017-09-27 DIAGNOSIS — F0390 Unspecified dementia without behavioral disturbance: Secondary | ICD-10-CM | POA: Diagnosis not present

## 2017-09-28 DIAGNOSIS — I4891 Unspecified atrial fibrillation: Secondary | ICD-10-CM | POA: Diagnosis not present

## 2017-09-28 DIAGNOSIS — I10 Essential (primary) hypertension: Secondary | ICD-10-CM | POA: Diagnosis not present

## 2017-09-28 DIAGNOSIS — M5030 Other cervical disc degeneration, unspecified cervical region: Secondary | ICD-10-CM | POA: Diagnosis not present

## 2017-09-28 DIAGNOSIS — F0390 Unspecified dementia without behavioral disturbance: Secondary | ICD-10-CM | POA: Diagnosis not present

## 2017-09-28 DIAGNOSIS — S8012XD Contusion of left lower leg, subsequent encounter: Secondary | ICD-10-CM | POA: Diagnosis not present

## 2017-09-28 DIAGNOSIS — M5136 Other intervertebral disc degeneration, lumbar region: Secondary | ICD-10-CM | POA: Diagnosis not present

## 2017-09-28 DIAGNOSIS — J45909 Unspecified asthma, uncomplicated: Secondary | ICD-10-CM | POA: Diagnosis not present

## 2017-09-28 DIAGNOSIS — D693 Immune thrombocytopenic purpura: Secondary | ICD-10-CM | POA: Diagnosis not present

## 2017-09-28 DIAGNOSIS — S8011XD Contusion of right lower leg, subsequent encounter: Secondary | ICD-10-CM | POA: Diagnosis not present

## 2017-09-28 NOTE — Progress Notes (Signed)
SELAH, KLANG ([id number]) Visit Report for 09/22/2017 Arrival Information Details Patient Name: Ariana [name], Ariana [name]. Date of Service: 09/22/2017 2:15 PM Medical Record Number: [id number] Patient Account Number: [id number] Date of Birth/Sex: January 26, 1933 (84 y.o. Female) Treating RN: Ahmed Prima Primary Care Drenda Sobecki: Fulton Reek Other Clinician: Referring Roselene Gray: Fulton Reek Treating Tej Murdaugh/Extender: Tito Dine in Treatment: 1 Visit Information History Since Last Visit All ordered tests and consults were completed: No Patient Arrived: Ambulatory Added or deleted any medications: No Arrival Time: 14:36 Any new allergies or adverse reactions: No Accompanied By: son Had a fall or experienced change in No Transfer Assistance: None activities of daily living that may affect Patient Identification Verified: Yes risk of falls: Secondary Verification Process Completed: Yes Signs or symptoms of abuse/neglect since last visito No Patient Requires Transmission-Based No Hospitalized since last visit: No Precautions: Has Dressing in Place as Prescribed: Yes Patient Has Alerts: No Pain Present Now: No Electronic Signature(s) Signed: 09/22/2017 4:44:53 PM By: Alric Quan Entered By: Alric Quan on 09/22/2017 14:37:05 Ariana [name] ([id number]) -------------------------------------------------------------------------------- Encounter Discharge Information Details Patient Name: Ariana [name]. Date of Service: 09/22/2017 2:15 PM Medical Record Number: [id number] Patient Account Number: [id number] Date of Birth/Sex: Apr 02, 1933 (84 y.o. Female) Treating RN: Carolyne Fiscal, Debi Primary Care Samyak Sackmann: Fulton Reek Other Clinician: Referring Wael Maestas: Fulton Reek Treating Avaiyah Strubel/Extender: Tito Dine in Treatment: 1 Encounter Discharge Information Items Discharge Pain Level: 0 Discharge Condition: Stable Ambulatory  Status: Ambulatory Discharge Destination: Home Transportation: Private Auto Accompanied By: son Schedule Follow-up Appointment: Yes Medication Reconciliation completed and No provided to Patient/Care Ariabella Brien: Provided on Clinical Summary of Care: 09/22/2017 Form Type Recipient Paper Patient BI Electronic Signature(s) Signed: 09/28/2017 11:15:46 AM By: Ruthine Dose Previous Signature: 09/22/2017 2:52:40 PM Version By: Alric Quan Entered By: Ruthine Dose on 09/22/2017 15:22:23 Ariana [name] ([id number]) -------------------------------------------------------------------------------- Lower Extremity Assessment Details Patient Name: Ariana [name]. Date of Service: 09/22/2017 2:15 PM Medical Record Number: [id number] Patient Account Number: [id number] Date of Birth/Sex: 06/23/1933 (84 y.o. Female) Treating RN: Carolyne Fiscal, Debi Primary Care Saysha Menta: Fulton Reek Other Clinician: Referring Shailey Butterbaugh: Fulton Reek Treating Alithia Zavaleta/Extender: Tito Dine in Treatment: 1 Vascular Assessment Pulses: Dorsalis Pedis Palpable: [Left:Yes] [Right:Yes] Posterior Tibial Extremity colors, hair growth, and conditions: Extremity Color: [Left:Mottled] [Right:Mottled] Temperature of Extremity: [Left:Warm] [Right:Warm] Capillary Refill: [Left:> 3 seconds] [Right:> 3 seconds] Toe Nail Assessment Left: Right: Thick: No No Discolored: No No Deformed: No No Improper Length and Hygiene: No No Electronic Signature(s) Signed: 09/22/2017 4:44:53 PM By: Alric Quan Entered By: Alric Quan on 09/22/2017 14:50:48 Ariana [name] ([id number]) -------------------------------------------------------------------------------- Multi Wound Chart Details Patient Name: Ariana [name]. Date of Service: 09/22/2017 2:15 PM Medical Record Number: [id number] Patient Account Number: [id number] Date of Birth/Sex: Oct 15, 1932 (84 y.o. Female) Treating RN:  Carolyne Fiscal, Debi Primary Care Leilan Bochenek: Fulton Reek Other Clinician: Referring Jamielynn Wigley: Fulton Reek Treating Leeloo Silverthorne/Extender: Tito Dine in Treatment: 1 Vital Signs Height(in): 65 Pulse(bpm): 60 Weight(lbs): 133.4 Blood Pressure(mmHg): 113/79 Body Mass Index(BMI): 22 Temperature(F): 97.5 Respiratory Rate 18 (breaths/min): Photos: [1:No Photos] [2:No Photos] [N/A:N/A] Wound Location: [1:Left Lower Leg - Lateral] [2:Right Lower Leg - Medial] [N/A:N/A] Wounding Event: [1:Trauma] [2:Trauma] [N/A:N/A] Primary Etiology: [1:Trauma, Other] [2:Trauma, Other] [N/A:N/A] Comorbid History: [1:Cataracts] [2:Cataracts] [N/A:N/A] Date Acquired: [1:08/25/2017] [2:08/25/2017] [N/A:N/A] Weeks of Treatment: [1:1] [2:1] [N/A:N/A] Wound Status: [1:Open] [2:Open] [N/A:N/A] Measurements L x W x D [1:4x3.5x0.1] [2:3.5x3x0.1] [N/A:N/A] (cm) Area (cm) : [1:10.996] [2:8.247] [N/A:N/A] Volume (cm) : [1:1.1] [2:0.825] [N/A:N/A] % Reduction in Area: [1:7.80%] [2:36.90%] [  N/A:N/A] % Reduction in Volume: [1:7.80%] [2:36.90%] [N/A:N/A] Classification: [1:Partial Thickness] [2:Partial Thickness] [N/A:N/A] Exudate Amount: [1:Large] [2:Large] [N/A:N/A] Exudate Type: [1:Serosanguineous] [2:Serosanguineous] [N/A:N/A] Exudate Color: [1:red, brown] [2:red, brown] [N/A:N/A] Wound Margin: [1:Distinct, outline attached] [2:Distinct, outline attached] [N/A:N/A] Granulation Amount: [1:Large (67-100%)] [2:None Present (0%)] [N/A:N/A] Granulation Quality: [1:Red] [2:N/A] [N/A:N/A] Necrotic Amount: [1:Small (1-33%)] [2:Large (67-100%)] [N/A:N/A] Necrotic Tissue: [1:Adherent Slough] [2:Eschar] [N/A:N/A] Exposed Structures: [1:Fascia: No Fat Layer (Subcutaneous Tissue) Exposed: No Tendon: No Muscle: No Joint: No Bone: No] [2:Fascia: No Fat Layer (Subcutaneous Tissue) Exposed: No Tendon: No Muscle: No Joint: No Bone: No] [N/A:N/A] Epithelialization: [1:None] [2:None] [N/A:N/A] Debridement: [1:N/A]  [2:Debridement ([id number])] [N/A:N/A] Pre-procedure [1:N/A] [2:14:57] [N/A:N/A] Verification/Time Out Taken: Pain Control: [1:N/A] [2:Lidocaine 4% Topical Solution N/A] Tissue Debrided: [1:N/A] [2:Fibrin/Slough, Exudates, Subcutaneous] [N/A:N/A] Level: N/A Skin/Subcutaneous Tissue N/A Debridement Area (sq cm): N/A 10.5 N/A Instrument: N/A Blade, Forceps, Other(scoop) N/A Bleeding: N/A Minimum N/A Hemostasis Achieved: N/A Pressure N/A Procedural Pain: N/A 0 N/A Post Procedural Pain: N/A 0 N/A Debridement Treatment N/A Procedure was tolerated well N/A Response: Post Debridement N/A 3.7x3.1x0.3 N/A Measurements L x W x D (cm) Post Debridement Volume: N/A 2.703 N/A (cm) Periwound Skin Texture: Excoriation: Yes No Abnormalities Noted N/A Periwound Skin Moisture: Maceration: Yes Maceration: Yes N/A Periwound Skin Color: Erythema: Yes Erythema: Yes N/A Erythema Location: Circumferential Circumferential N/A Temperature: No Abnormality No Abnormality N/A Tenderness on Palpation: Yes Yes N/A Wound Preparation: Ulcer Cleansing: Ulcer Cleansing: N/A Rinsed/Irrigated with Saline Rinsed/Irrigated with Saline Topical Anesthetic Applied: Topical Anesthetic Applied: Other: lidocaine 4% Other: lidocaine 4% Procedures Performed: N/A Debridement N/A Treatment Notes Wound #1 (Left, Lateral Lower Leg) 1. Cleansed with: Clean wound with Normal Saline 2. Anesthetic Topical Lidocaine 4% cream to wound bed prior to debridement 3. Peri-wound Care: Barrier cream Skin Prep 4. Dressing Applied: Hydrafera Blue 5. Secondary Dressing Applied Bordered Foam Dressing Notes adaptic Wound #2 (Right, Medial Lower Leg) 1. Cleansed with: Clean wound with Normal Saline 2. Anesthetic Topical Lidocaine 4% cream to wound bed prior to debridement 3. Peri-wound Care: Barrier cream Skin Prep 4. Dressing Applied: Hydrafera Blue 5. Secondary Dressing Applied TAEKO, SCHAFFER  ([id number]) Bordered Foam Dressing Notes adaptic Electronic Signature(s) Signed: 09/22/2017 5:01:55 PM By: Linton Ham MD Previous Signature: 09/22/2017 2:52:19 PM Version By: Alric Quan Entered By: Linton Ham on 09/22/2017 15:33:02 Ariana [name] ([id number]) -------------------------------------------------------------------------------- Multi-Disciplinary Care Plan Details Patient Name: Ariana [name] Date of Service: 09/22/2017 2:15 PM Medical Record Number: [id number] Patient Account Number: [id number] Date of Birth/Sex: May 23, 1933 (84 y.o. Female) Treating RN: Carolyne Fiscal, Debi Primary Care Breleigh Carpino: Fulton Reek Other Clinician: Referring Glendene Wyer: Fulton Reek Treating Akari Defelice/Extender: Tito Dine in Treatment: 1 Active Inactive ` Abuse / Safety / Falls / Self Care Management Nursing Diagnoses: History of Falls Potential for falls Goals: Patient will not experience any injury related to falls Date Initiated: 09/15/2017 Target Resolution Date: 01/15/2018 Goal Status: Active Interventions: Assess Activities of Daily Living upon admission and as needed Assess fall risk on admission and as needed Assess: immobility, friction, shearing, incontinence upon admission and as needed Assess impairment of mobility on admission and as needed per policy Assess personal safety and home safety (as indicated) on admission and as needed Assess self care needs on admission and as needed Notes: ` Nutrition Nursing Diagnoses: Imbalanced nutrition Potential for alteratiion in Nutrition/Potential for imbalanced nutrition Goals: Patient/caregiver agrees to and verbalizes understanding of need to use nutritional supplements and/or vitamins as prescribed Date Initiated: 09/15/2017 Target Resolution Date: 01/15/2018 Goal Status:  Active Interventions: Assess patient nutrition upon admission and as needed per policy Notes: ` Orientation to the Wound  Care Program Nursing Diagnoses: SILVANA, HOLECEK ([id number]) Knowledge deficit related to the wound healing center program Goals: Patient/caregiver will verbalize understanding of the [place] Date Initiated: 09/15/2017 Target Resolution Date: 01/15/2018 Goal Status: Active Interventions: Provide education on orientation to the wound center Notes: ` Pain, Acute or Chronic Nursing Diagnoses: Pain, acute or chronic: actual or potential Potential alteration in comfort, pain Goals: Patient/caregiver will verbalize adequate pain control between visits Date Initiated: 09/15/2017 Target Resolution Date: 01/15/2018 Goal Status: Active Interventions: Complete pain assessment as per visit requirements Notes: ` Wound/Skin Impairment Nursing Diagnoses: Impaired tissue integrity Knowledge deficit related to ulceration/compromised skin integrity Goals: Ulcer/skin breakdown will have a volume reduction of 80% by week 12 Date Initiated: 09/15/2017 Target Resolution Date: 01/15/2018 Goal Status: Active Interventions: Assess patient/caregiver ability to perform ulcer/skin care regimen upon admission and as needed Assess ulceration(s) every visit Notes: Electronic Signature(s) Signed: 09/22/2017 2:52:13 PM By: Alric Quan Entered By: Alric Quan on 09/22/2017 14:52:13 Ariana [name] ([id number]) -------------------------------------------------------------------------------- Pain Assessment Details Patient Name: Ariana [name]. Date of Service: 09/22/2017 2:15 PM Medical Record Number: [id number] Patient Account Number: [id number] Date of Birth/Sex: 03-15-1933 (84 y.o. Female) Treating RN: Carolyne Fiscal, Debi Primary Care Paulene Tayag: Fulton Reek Other Clinician: Referring Maya Arcand: Fulton Reek Treating Cameka Rae/Extender: Tito Dine in Treatment: 1 Active Problems Location of Pain Severity and Description of Pain Patient Has Paino  No Site Locations Pain Management and Medication Current Pain Management: Electronic Signature(s) Signed: 09/22/2017 4:44:53 PM By: Alric Quan Entered By: Alric Quan on 09/22/2017 14:37:10 Ariana [name] ([id number]) -------------------------------------------------------------------------------- Patient/Caregiver Education Details Patient Name: Ariana [name] Date of Service: 09/22/2017 2:15 PM Medical Record Number: [id number] Patient Account Number: [id number] Date of Birth/Gender: 09-Jan-1933 (84 y.o. Female) Treating RN: Ahmed Prima Primary Care Physician: Fulton Reek Other Clinician: Referring Physician: Fulton Reek Treating Physician/Extender: Tito Dine in Treatment: 1 Education Assessment Education Provided To: Patient Education Topics Provided Wound/Skin Impairment: Handouts: Other: change dressing as ordered Methods: Demonstration, Explain/Verbal Responses: State content correctly Electronic Signature(s) Signed: 09/22/2017 4:44:53 PM By: Alric Quan Entered By: Alric Quan on 09/22/2017 14:52:58 Ariana [name] ([id number]) -------------------------------------------------------------------------------- Wound Assessment Details Patient Name: Ariana [name]. Date of Service: 09/22/2017 2:15 PM Medical Record Number: [id number] Patient Account Number: [id number] Date of Birth/Sex: April 14, 1933 (84 y.o. Female) Treating RN: Carolyne Fiscal, Debi Primary Care Eris Breck: Fulton Reek Other Clinician: Referring Zahra Peffley: Fulton Reek Treating Brianah Hopson/Extender: Tito Dine in Treatment: 1 Wound Status Wound Number: 1 Primary Etiology: Trauma, Other Wound Location: Left Lower Leg - Lateral Wound Status: Open Wounding Event: Trauma Comorbid History: Cataracts Date Acquired: 08/25/2017 Weeks Of Treatment: 1 Clustered Wound: No Photos Photo Uploaded By: Alric Quan on 09/22/2017  16:37:28 Wound Measurements Length: (cm) 4 Width: (cm) 3.5 Depth: (cm) 0.1 Area: (cm) 10.996 Volume: (cm) 1.1 % Reduction in Area: 7.8% % Reduction in Volume: 7.8% Epithelialization: None Tunneling: No Undermining: No Wound Description Classification: Partial Thickness Wound Margin: Distinct, outline attached Exudate Amount: Large Exudate Type: Serosanguineous Exudate Color: red, brown Foul Odor After Cleansing: No Slough/Fibrino Yes Wound Bed Granulation Amount: Large (67-100%) Exposed Structure Granulation Quality: Red Fascia Exposed: No Necrotic Amount: Small (1-33%) Fat Layer (Subcutaneous Tissue) Exposed: No Necrotic Quality: Adherent Slough Tendon Exposed: No Muscle Exposed: No Joint Exposed: No Bone Exposed: No Periwound Skin Texture Hargens, Ilah E. ([id number]) Texture Color No Abnormalities Noted: No No  Abnormalities Noted: No Excoriation: Yes Erythema: Yes Erythema Location: Circumferential Moisture No Abnormalities Noted: No Temperature / Pain Maceration: Yes Temperature: No Abnormality Tenderness on Palpation: Yes Wound Preparation Ulcer Cleansing: Rinsed/Irrigated with Saline Topical Anesthetic Applied: Other: lidocaine 4%, Treatment Notes Wound #1 (Left, Lateral Lower Leg) 1. Cleansed with: Clean wound with Normal Saline 2. Anesthetic Topical Lidocaine 4% cream to wound bed prior to debridement 3. Peri-wound Care: Barrier cream Skin Prep 4. Dressing Applied: Hydrafera Blue 5. Secondary Dressing Applied Bordered Foam Dressing Notes adaptic Electronic Signature(s) Signed: 09/22/2017 4:44:53 PM By: Alric Quan Entered By: Alric Quan on 09/22/2017 14:47:04 Ariana [name] ([id number]) -------------------------------------------------------------------------------- Wound Assessment Details Patient Name: Ariana [name] Date of Service: 09/22/2017 2:15 PM Medical Record Number: [id number] Patient Account  Number: [id number] Date of Birth/Sex: 1932-10-27 (84 y.o. Female) Treating RN: Carolyne Fiscal, Debi Primary Care Mina Carlisi: Fulton Reek Other Clinician: Referring Kaikoa Magro: Fulton Reek Treating Clover Feehan/Extender: Tito Dine in Treatment: 1 Wound Status Wound Number: 2 Primary Etiology: Trauma, Other Wound Location: Right Lower Leg - Medial Wound Status: Open Wounding Event: Trauma Comorbid History: Cataracts Date Acquired: 08/25/2017 Weeks Of Treatment: 1 Clustered Wound: No Photos Photo Uploaded By: Alric Quan on 09/22/2017 16:37:28 Wound Measurements Length: (cm) 3.5 Width: (cm) 3 Depth: (cm) 0.1 Area: (cm) 8.247 Volume: (cm) 0.825 % Reduction in Area: 36.9% % Reduction in Volume: 36.9% Epithelialization: None Tunneling: No Undermining: No Wound Description Classification: Partial Thickness Wound Margin: Distinct, outline attached Exudate Amount: Large Exudate Type: Serosanguineous Exudate Color: red, brown Foul Odor After Cleansing: No Slough/Fibrino No Wound Bed Granulation Amount: None Present (0%) Exposed Structure Necrotic Amount: Large (67-100%) Fascia Exposed: No Necrotic Quality: Eschar Fat Layer (Subcutaneous Tissue) Exposed: No Tendon Exposed: No Muscle Exposed: No Joint Exposed: No Bone Exposed: No Periwound Skin Texture Creasey, Bryson E. ([id number]) Texture Color No Abnormalities Noted: No No Abnormalities Noted: No Erythema: Yes Moisture Erythema Location: Circumferential No Abnormalities Noted: No Maceration: Yes Temperature / Pain Temperature: No Abnormality Tenderness on Palpation: Yes Wound Preparation Ulcer Cleansing: Rinsed/Irrigated with Saline Topical Anesthetic Applied: Other: lidocaine 4%, Treatment Notes Wound #2 (Right, Medial Lower Leg) 1. Cleansed with: Clean wound with Normal Saline 2. Anesthetic Topical Lidocaine 4% cream to wound bed prior to debridement 3. Peri-wound Care: Barrier  cream Skin Prep 4. Dressing Applied: Hydrafera Blue 5. Secondary Dressing Applied Bordered Foam Dressing Notes adaptic Electronic Signature(s) Signed: 09/22/2017 4:44:53 PM By: Alric Quan Entered By: Alric Quan on 09/22/2017 14:48:02 Ariana [name] ([id number]) -------------------------------------------------------------------------------- Vitals Details Patient Name: Ariana [name] Date of Service: 09/22/2017 2:15 PM Medical Record Number: [id number] Patient Account Number: [id number] Date of Birth/Sex: 1933-02-06 (84 y.o. Female) Treating RN: Carolyne Fiscal, Debi Primary Care Ajani Rineer: Fulton Reek Other Clinician: Referring Bathsheba Durrett: Fulton Reek Treating Tynlee Bayle/Extender: Tito Dine in Treatment: 1 Vital Signs Time Taken: 14:37 Temperature (F): 97.5 Height (in): 65 Pulse (bpm): 60 Weight (lbs): 133.4 Respiratory Rate (breaths/min): 18 Body Mass Index (BMI): 22.2 Blood Pressure (mmHg): 113/79 Reference Range: 80 - 120 mg / dl Electronic Signature(s) Signed: 09/22/2017 4:44:53 PM By: Alric Quan Entered By: Alric Quan on 09/22/2017 14:39:31

## 2017-09-28 NOTE — Progress Notes (Signed)
[facility]  [phone]) [phone]  [phone]) [phone]     Ariana [name] DOB: Nov 28, 1932  MR#: [id number]  YBO#:[id number]  Patient Care Team: Idelle Crouch, MD as PCP - General (Internal Medicine)  CHIEF COMPLAINT: Chronic refractory ITP  INTERVAL HISTORY:  Patient returns to clinic today for repeat laboratory work and consideration of additional Nplate. She continues to have increased weakness and fatigue the week leading up to her appointment. She continues to have easy bruising, but denies any bleeding. She has no neurologic complaints. She denies any fevers. She has no chest pain or shortness of breath. She denies any nausea, vomiting, constipation, or diarrhea. She has no urinary complaints. Patient offers no further specific complaints today.  REVIEW OF SYSTEMS:   Review of Systems  Constitutional: Positive for malaise/fatigue. Negative for fever and weight loss.  HENT: Negative for congestion.   Respiratory: Negative.  Negative for cough, hemoptysis and shortness of breath.   Cardiovascular: Negative.  Negative for chest pain and leg swelling.  Gastrointestinal: Negative for abdominal pain, blood in stool, diarrhea, melena and nausea.  Genitourinary: Negative.  Negative for dysuria and hematuria.  Musculoskeletal: Negative.   Skin: Negative.  Negative for rash.  Neurological: Positive for weakness. Negative for sensory change.  Endo/Heme/Allergies: Bruises/bleeds easily.  Psychiatric/Behavioral: Positive for depression and memory loss. The patient is not nervous/anxious.     As per HPI. Otherwise, a complete review of systems is negative.  PAST MEDICAL HISTORY: Past Medical History:  Diagnosis Date  . Anemia   . Anxiety   . Asthma   . Chronic back pain   . Depression   . Depression   . GERD (gastroesophageal reflux disease)   . Hiatal hernia   . Hypercholesteremia   . ITP (idiopathic thrombocytopenic purpura)   . Osteoarthritis   .  Osteoporosis   . Pulmonary emboli ([place])   . Restless legs     PAST SURGICAL HISTORY: Past Surgical History:  Procedure Laterality Date  . CARPAL TUNNEL RELEASE    . ESOPHAGOGASTRODUODENOSCOPY (EGD) WITH PROPOFOL N/A 09/10/2015   Procedure: ESOPHAGOGASTRODUODENOSCOPY (EGD) WITH PROPOFOL;  Surgeon: Lollie Sails, MD;  Location: [facility] ENDOSCOPY;  Service: Endoscopy;  Laterality: N/A;  . LUMBAR [place]    . SPLENECTOMY, PARTIAL    . TOTAL ABDOMINAL HYSTERECTOMY      FAMILY HISTORY Family History  Problem Relation Age of Onset  . Stroke Mother     GYNECOLOGIC HISTORY:  No LMP recorded. Patient is postmenopausal.     ADVANCED DIRECTIVES:    HEALTH MAINTENANCE: Social History   Tobacco Use  . Smoking status: Never Smoker  . Smokeless tobacco: Never Used  Substance Use Topics  . Alcohol use: No    Alcohol/week: 0.0 oz  . Drug use: No     Allergies  Allergen Reactions  . Aspirin     Other reaction(s): Distress (finding)  . Diazepam     Other reaction(s): Itching of Skin sneezing  . Morphine     Other reaction(s): Itching of Skin  . Other     Other reaction(s): Unknown seasonal allergies  . Tetanus Toxoids     Other reaction(s): Localized superficial swelling of skin    Current Outpatient Medications  Medication Sig Dispense Refill  . acetaminophen (TYLENOL) 325 MG tablet Take 2 tablets (650 mg total) by mouth every 6 (six) hours as needed for mild pain (or Fever >/= 101). 100 tablet 0  . albuterol (PROVENTIL HFA;VENTOLIN HFA) 108 (90 BASE)  MCG/ACT inhaler Inhale 2 puffs into the lungs every 6 (six) hours as needed for wheezing or shortness of breath.    . ALPRAZolam (XANAX) 0.5 MG tablet Take by mouth.    . Calcium Carbonate-Vitamin D (CALCIUM 600+D) 600-400 MG-UNIT per tablet Take 1 tablet by mouth daily.    . citalopram (CELEXA) 40 MG tablet Take 40 mg by mouth daily.    . Cyanocobalamin (RA VITAMIN B-12 TR) 1000 MCG TBCR Take 1,000 mcg by mouth daily.      Marland Kitchen donepezil (ARICEPT) 5 MG tablet Take 5 mg by mouth daily.    . fluticasone (FLONASE) 50 MCG/ACT nasal spray Place into the nose.    Marland Kitchen Fluticasone-Salmeterol (ADVAIR) 250-50 MCG/DOSE AEPB Inhale 1 puff into the lungs daily as needed.     . gabapentin (NEURONTIN) 300 MG capsule 300mg  orally two times a day    . HYDROcodone-acetaminophen (NORCO) 7.5-325 MG tablet 1 tablet by mouth q 6 h prn    . levothyroxine (SYNTHROID, LEVOTHROID) 50 MCG tablet Take 50 mcg by mouth daily before breakfast. Take 30 to 60 minutes before breakfast.    . LORazepam (ATIVAN) 0.5 MG tablet Take 1 tablet (0.5 mg total) by mouth every 8 (eight) hours as needed for anxiety. 30 tablet 0  . montelukast (SINGULAIR) 10 MG tablet Take by mouth.    . Multiple Vitamins-Minerals (HAIR SKIN AND NAILS FORMULA PO) Take 1 tablet by mouth every evening.    . Multiple Vitamins-Minerals (OCUVITE-LUTEIN PO) Take 1 capsule by mouth daily.     . pantoprazole (PROTONIX) 20 MG tablet Take 20 mg by mouth 2 (two) times daily.     . phenazopyridine (PYRIDIUM) 200 MG tablet Take 1 tablet (200 mg total) by mouth 3 (three) times daily as needed for pain. 15 tablet 0  . polycarbophil (FIBERCON) 625 MG tablet Take by mouth.    . pravastatin (PRAVACHOL) 20 MG tablet Take 20 mg by mouth daily.    . traMADol (ULTRAM) 50 MG tablet Take 50 mg by mouth 3 (three) times daily as needed for moderate pain or severe pain. pain    . vitamin C (ASCORBIC ACID) 500 MG tablet Take 500 mg by mouth daily.    . vitamin E (E-400) 400 UNIT capsule Take 400 Units by mouth daily.    . memantine (NAMENDA) 5 MG tablet Take 5 mg by mouth 2 (two) times daily.      No current facility-administered medications for this visit.     OBJECTIVE: There were no vitals taken for this visit.   There is no height or weight on file to calculate BMI.    ECOG FS:1 - Symptomatic but completely ambulatory  General: Well-developed, well-nourished, no acute distress. Eyes: Pink  conjunctiva, anicteric sclera. HEENT: Normocephalic, moist mucous membranes, clear oropharnyx. Lungs: Clear to auscultation bilaterally. Heart: Regular rate and rhythm. No rubs, murmurs, or gallops. Musculoskeletal: No edema, cyanosis, or clubbing. Neuro: Alert, answering all questions appropriately. Cranial nerves grossly intact. Skin: No petechiae noted. Bruising noted in various stages of healing. Psych: Normal affect.   LAB RESULTS:  Appointment on 09/30/2017  Component Date Value Ref Range Status  . WBC 09/30/2017 10.1  3.6 - 11.0 K/uL Final  . RBC 09/30/2017 4.05  3.80 - 5.20 MIL/uL Final  . Hemoglobin 09/30/2017 11.3* 12.0 - 16.0 g/dL Final  . HCT 09/30/2017 35.7  35.0 - 47.0 % Final  . MCV 09/30/2017 88.0  80.0 - 100.0 fL Final  . MCH 09/30/2017 27.9  26.0 - 34.0 pg Final  . MCHC 09/30/2017 31.7* 32.0 - 36.0 g/dL Final  . RDW 09/30/2017 18.0* 11.5 - 14.5 % Final  . Platelets 09/30/2017 35* 150 - 400 K/uL Final   Comment: PLATELET COUNT CONFIRMED BY SMEAR COUNT MAY BE INACCURATE DUE TO FIBRIN CLUMPS. PLATELET CLUMPING, SUGGEST RECOLLECTION OF SAMPLE IN CITRATE TUBE. GIANT PLATELETS SEEN   . Neutrophils Relative % 09/30/2017 54  % Final  . Neutro Abs 09/30/2017 5.5  1.4 - 6.5 K/uL Final  . Lymphocytes Relative 09/30/2017 35  % Final  . Lymphs Abs 09/30/2017 3.5  1.0 - 3.6 K/uL Final  . Monocytes Relative 09/30/2017 9  % Final  . Monocytes Absolute 09/30/2017 0.9  0.2 - 0.9 K/uL Final  . Eosinophils Relative 09/30/2017 1  % Final  . Eosinophils Absolute 09/30/2017 0.1  0 - 0.7 K/uL Final  . Basophils Relative 09/30/2017 1  % Final  . Basophils Absolute 09/30/2017 0.1  0 - 0.1 K/uL Final  . Smear Review 09/30/2017 POLYCHROMASIA PRESENT   Corrected   Comment: TARGET CELLS GIANT PLATELETS SEEN     STUDIES: No results found.  ASSESSMENT:  Chronic refractory ITP  PLAN:   1. Chronic refractory ITP: Patient had a poor response to Prednisone, WinRho, and Rituxan. She is  status post splenectomy in 2007. Her platelet count is less than 100, therefore will proceed with 7 mcg/kg Nplate today. Return to clinic in 4 weeks for laboratory work, further evaluation, and consideration of Nplate if her platelet is below 100. Patient appears to only requiring injections every 3-4 weeks. Can consider Promacta 12.5mg  at a later date if necessary. Max dose of Nplate is 10 mcg/kg. 2. Abdominal pain: Resolved. CT scan did not reveal any distinct etiology. 3. Constipation: Patient does not complain of this today. 4. Weakness and fatigue: Multifactorial. Although patient states she feels improved after receiving her injections in her platelet increase. 5. Depression/dementia: Monitor. Continue follow-up with primary care.  Approximately 30 minutes spent in discussion of which greater than 50% was consultation.  Patient expressed understanding and was in agreement with this plan. She also understands that She can call clinic at any time with any questions, concerns, or complaints.    Lloyd Huger, MD   10/03/2017 9:03 AM

## 2017-09-29 ENCOUNTER — Encounter: Payer: Medicare HMO | Admitting: Internal Medicine

## 2017-09-29 DIAGNOSIS — S8012XD Contusion of left lower leg, subsequent encounter: Secondary | ICD-10-CM | POA: Diagnosis not present

## 2017-09-29 DIAGNOSIS — D693 Immune thrombocytopenic purpura: Secondary | ICD-10-CM | POA: Diagnosis not present

## 2017-09-29 DIAGNOSIS — S8011XD Contusion of right lower leg, subsequent encounter: Secondary | ICD-10-CM | POA: Diagnosis not present

## 2017-09-29 DIAGNOSIS — Z885 Allergy status to narcotic agent status: Secondary | ICD-10-CM | POA: Diagnosis not present

## 2017-09-29 DIAGNOSIS — S8011XA Contusion of right lower leg, initial encounter: Secondary | ICD-10-CM | POA: Diagnosis not present

## 2017-09-29 DIAGNOSIS — S8012XA Contusion of left lower leg, initial encounter: Secondary | ICD-10-CM | POA: Diagnosis not present

## 2017-09-30 ENCOUNTER — Inpatient Hospital Stay: Payer: Medicare HMO

## 2017-09-30 ENCOUNTER — Other Ambulatory Visit: Payer: Self-pay

## 2017-09-30 ENCOUNTER — Inpatient Hospital Stay (HOSPITAL_BASED_OUTPATIENT_CLINIC_OR_DEPARTMENT_OTHER): Payer: Medicare HMO | Admitting: Oncology

## 2017-09-30 ENCOUNTER — Inpatient Hospital Stay: Payer: Medicare HMO | Attending: Oncology

## 2017-09-30 ENCOUNTER — Encounter: Payer: Self-pay | Admitting: Oncology

## 2017-09-30 DIAGNOSIS — Z9081 Acquired absence of spleen: Secondary | ICD-10-CM

## 2017-09-30 DIAGNOSIS — R5381 Other malaise: Secondary | ICD-10-CM | POA: Diagnosis not present

## 2017-09-30 DIAGNOSIS — R5383 Other fatigue: Secondary | ICD-10-CM | POA: Diagnosis not present

## 2017-09-30 DIAGNOSIS — Z90710 Acquired absence of both cervix and uterus: Secondary | ICD-10-CM | POA: Insufficient documentation

## 2017-09-30 DIAGNOSIS — F418 Other specified anxiety disorders: Secondary | ICD-10-CM | POA: Diagnosis not present

## 2017-09-30 DIAGNOSIS — G8929 Other chronic pain: Secondary | ICD-10-CM | POA: Insufficient documentation

## 2017-09-30 DIAGNOSIS — E7800 Pure hypercholesterolemia, unspecified: Secondary | ICD-10-CM

## 2017-09-30 DIAGNOSIS — K219 Gastro-esophageal reflux disease without esophagitis: Secondary | ICD-10-CM | POA: Insufficient documentation

## 2017-09-30 DIAGNOSIS — D693 Immune thrombocytopenic purpura: Secondary | ICD-10-CM | POA: Diagnosis not present

## 2017-09-30 DIAGNOSIS — M1990 Unspecified osteoarthritis, unspecified site: Secondary | ICD-10-CM

## 2017-09-30 DIAGNOSIS — F0390 Unspecified dementia without behavioral disturbance: Secondary | ICD-10-CM | POA: Diagnosis not present

## 2017-09-30 DIAGNOSIS — R531 Weakness: Secondary | ICD-10-CM | POA: Insufficient documentation

## 2017-09-30 DIAGNOSIS — F329 Major depressive disorder, single episode, unspecified: Secondary | ICD-10-CM

## 2017-09-30 DIAGNOSIS — Z79899 Other long term (current) drug therapy: Secondary | ICD-10-CM | POA: Diagnosis not present

## 2017-09-30 DIAGNOSIS — M810 Age-related osteoporosis without current pathological fracture: Secondary | ICD-10-CM | POA: Diagnosis not present

## 2017-09-30 DIAGNOSIS — Z86711 Personal history of pulmonary embolism: Secondary | ICD-10-CM | POA: Diagnosis not present

## 2017-09-30 DIAGNOSIS — M549 Dorsalgia, unspecified: Secondary | ICD-10-CM | POA: Insufficient documentation

## 2017-09-30 LAB — CBC WITH DIFFERENTIAL/PLATELET
BASOS ABS: 0.1 10*3/uL (ref 0–0.1)
BASOS PCT: 1 %
Eosinophils Absolute: 0.1 10*3/uL (ref 0–0.7)
Eosinophils Relative: 1 %
HEMATOCRIT: 35.7 % (ref 35.0–47.0)
HEMOGLOBIN: 11.3 g/dL — AB (ref 12.0–16.0)
LYMPHS PCT: 35 %
Lymphs Abs: 3.5 10*3/uL (ref 1.0–3.6)
MCH: 27.9 pg (ref 26.0–34.0)
MCHC: 31.7 g/dL — AB (ref 32.0–36.0)
MCV: 88 fL (ref 80.0–100.0)
Monocytes Absolute: 0.9 10*3/uL (ref 0.2–0.9)
Monocytes Relative: 9 %
NEUTROS ABS: 5.5 10*3/uL (ref 1.4–6.5)
NEUTROS PCT: 54 %
PLATELETS: 35 10*3/uL — AB (ref 150–400)
RBC: 4.05 MIL/uL (ref 3.80–5.20)
RDW: 18 % — AB (ref 11.5–14.5)
WBC: 10.1 10*3/uL (ref 3.6–11.0)

## 2017-09-30 MED ORDER — ROMIPLOSTIM INJECTION 500 MCG
430.0000 ug | Freq: Once | SUBCUTANEOUS | Status: AC
  Administered 2017-09-30: 430 ug via SUBCUTANEOUS
  Filled 2017-09-30: qty 0.86

## 2017-09-30 NOTE — Progress Notes (Signed)
Patient denies any concerns today.  

## 2017-10-01 DIAGNOSIS — F0390 Unspecified dementia without behavioral disturbance: Secondary | ICD-10-CM | POA: Diagnosis not present

## 2017-10-01 DIAGNOSIS — M5136 Other intervertebral disc degeneration, lumbar region: Secondary | ICD-10-CM | POA: Diagnosis not present

## 2017-10-01 DIAGNOSIS — M5030 Other cervical disc degeneration, unspecified cervical region: Secondary | ICD-10-CM | POA: Diagnosis not present

## 2017-10-01 DIAGNOSIS — D693 Immune thrombocytopenic purpura: Secondary | ICD-10-CM | POA: Diagnosis not present

## 2017-10-01 DIAGNOSIS — S8011XD Contusion of right lower leg, subsequent encounter: Secondary | ICD-10-CM | POA: Diagnosis not present

## 2017-10-01 DIAGNOSIS — I10 Essential (primary) hypertension: Secondary | ICD-10-CM | POA: Diagnosis not present

## 2017-10-01 DIAGNOSIS — S8012XD Contusion of left lower leg, subsequent encounter: Secondary | ICD-10-CM | POA: Diagnosis not present

## 2017-10-01 DIAGNOSIS — I4891 Unspecified atrial fibrillation: Secondary | ICD-10-CM | POA: Diagnosis not present

## 2017-10-01 DIAGNOSIS — J45909 Unspecified asthma, uncomplicated: Secondary | ICD-10-CM | POA: Diagnosis not present

## 2017-10-03 ENCOUNTER — Encounter: Payer: Self-pay | Admitting: Emergency Medicine

## 2017-10-03 ENCOUNTER — Emergency Department
Admission: EM | Admit: 2017-10-03 | Discharge: 2017-10-04 | Disposition: A | Discharge status: Home / Self Care | Payer: Medicare HMO | Attending: Emergency Medicine | Admitting: Emergency Medicine

## 2017-10-03 DIAGNOSIS — D696 Thrombocytopenia, unspecified: Secondary | ICD-10-CM | POA: Diagnosis not present

## 2017-10-03 DIAGNOSIS — R040 Epistaxis: Secondary | ICD-10-CM | POA: Insufficient documentation

## 2017-10-03 DIAGNOSIS — Z79899 Other long term (current) drug therapy: Secondary | ICD-10-CM | POA: Diagnosis not present

## 2017-10-03 DIAGNOSIS — E039 Hypothyroidism, unspecified: Secondary | ICD-10-CM | POA: Diagnosis not present

## 2017-10-03 DIAGNOSIS — G301 Alzheimer's disease with late onset: Secondary | ICD-10-CM | POA: Insufficient documentation

## 2017-10-03 DIAGNOSIS — I10 Essential (primary) hypertension: Secondary | ICD-10-CM | POA: Diagnosis not present

## 2017-10-03 DIAGNOSIS — J45909 Unspecified asthma, uncomplicated: Secondary | ICD-10-CM | POA: Diagnosis not present

## 2017-10-03 NOTE — Progress Notes (Signed)
BETHANN, QUALLEY ([id number]) Visit Report for 09/29/2017 HPI Details Patient Name: Ariana [name], Ariana [name]. Date of Service: 09/29/2017 8:45 AM Medical Record Number: [id number] Patient Account Number: [id number] Date of Birth/Sex: December 11, 1932 (84 y.o. Female) Treating RN: Carolyne Fiscal, Debi Primary Care Provider: Fulton Reek Other Clinician: Referring Provider: Fulton Reek Treating Provider/Extender: Tito Dine in Treatment: 2 History of Present Illness HPI Description: 09/15/17; this is an 83 year old woman with a history of type 2 diabetes, chronic ITP for which she follows with oncology. She had a splenectomy in 2007. 3 weeks ago well sitting in the bathroom she fell off the platelet and hit her legs on the bathtub. She had 2 large hematomas. The area on the left has ruptured into a wound where the area on the right medial lower leg is actually still intact. She was given Keflex of concern for coexistent cellulitis on 11/28 the area on the right is still intact and she is not doing anything specific for that. She has a history of small hematomas related to thrombocytopenia on her lower legs although these all seem to have primarily healed. She has a history of falls 09/22/17; this is a patient who had traumatic hematomas on both her legs. When we admitted to clinic last week the left one had already evacuated whereas the right one had not. She arrived in clinic today with drainage out of the right side. We used Hydrofera Blue to the left 09/29/17; this is a patient who had a traumatic hematoma on both her legs. She has chronic ITP and states she bruises very easily. The right leg hematoma evacuated last week. We have been using Hydrofera Blue to both wound areas on both anterior legs. Today the one on the left is slightly hyper-granulated if the wound stalls we may need to address this. Otherwise the wounds are smaller and look healthy today Electronic  Signature(s) Signed: 10/03/2017 7:28:22 AM By: Linton Ham MD Entered By: Linton Ham on 09/29/2017 09:25:16 Ariana [name] ([id number]) -------------------------------------------------------------------------------- Physical Exam Details Patient Name: Ariana [name] Date of Service: 09/29/2017 8:45 AM Medical Record Number: [id number] Patient Account Number: [id number] Date of Birth/Sex: [birth year] (84 y.o. Female) Treating RN: Ahmed Prima Primary Care Provider: Fulton Reek Other Clinician: Referring Provider: Fulton Reek Treating Provider/Extender: Tito Dine in Treatment: 2 Constitutional Patient is hypertensive.. Pulse regular and within target range for patient.Marland Kitchen Respirations regular, non-labored and within target range.. Temperature is normal and within the target range for the patient.Marland Kitchen appears in no distress. Eyes Conjunctivae clear. No discharge. Respiratory Respiratory effort is easy and symmetric bilaterally. Rate is normal at rest and on room air.. Cardiovascular Pedal pulses palpable and strong bilaterally.. No edema. Frail skin in her lower extremities. Integumentary (Hair, Skin) The patient has multiple ecchymosis and she shows this one on her chest. Psychiatric No evidence of depression, anxiety, or agitation. Calm, cooperative, and communicative. Appropriate interactions and affect.. Notes Wound exam; wound area on the left leg is hyper granulated there is surrounding epithelialization and it measures smaller however if this does not continue to improve then addressing the hyper granulation is likely to be necessary oThe area on the right anterior medial leg which evacuated last week looks healthy in terms of granulation and advancing epithelialization. Improved measurements also noted Electronic Signature(s) Signed: 10/03/2017 7:28:22 AM By: Linton Ham MD Entered By: Linton Ham on 09/29/2017 09:29:12 Ariana [name] ([id number]) -------------------------------------------------------------------------------- Physician Orders Details Patient Name: Ariana [name] Date of Service: 09/29/2017 8:45 AM  Medical Record Number: [id number] Patient Account Number: [id number] Date of Birth/Sex: Jul 12, 1933 (84 y.o. Female) Treating RN: Carolyne Fiscal, Debi Primary Care Provider: Fulton Reek Other Clinician: Referring Provider: Fulton Reek Treating Provider/Extender: Tito Dine in Treatment: 2 Verbal / Phone Orders: Yes Clinician: [place], Debi Read Back and Verified: Yes Diagnosis Coding Wound Cleansing Wound #1 Left,Lateral Lower Leg o Clean wound with Normal Saline. o Cleanse wound with mild soap and water o May Shower, gently pat wound dry prior to applying new dressing. Wound #2 Right,Medial Lower Leg o Clean wound with Normal Saline. o Cleanse wound with mild soap and water o May Shower, gently pat wound dry prior to applying new dressing. Anesthetic (add to Medication List) Wound #1 Left,Lateral Lower Leg o Topical Lidocaine 4% cream applied to wound bed prior to debridement (In Clinic Only). Wound #2 Right,Medial Lower Leg o Topical Lidocaine 4% cream applied to wound bed prior to debridement (In Clinic Only). Skin Barriers/Peri-Wound Care Wound #1 Left,Lateral Lower Leg o Skin Prep Wound #2 Right,Medial Lower Leg o Skin Prep Primary Wound Dressing Wound #1 Left,Lateral Lower Leg o Hydrafera Blue o Other: - adaptic Wound #2 Right,Medial Lower Leg o Hydrafera Blue o Other: - adaptic Secondary Dressing Wound #1 Left,Lateral Lower Leg o Boardered Foam Dressing Wound #2 Right,Medial Lower Leg o Boardered Foam Dressing Dressing Change Frequency Wound #1 Left,Lateral Lower Leg Ariana [name], Ariana [name] ([id number]) o Change Dressing Monday, Wednesday, Friday - HHRN to change dressing to change dressing Mondays and Fridays and pt  will come to [facility] on Wednesdays. Wound #2 Right,Medial Lower Leg o Change Dressing Monday, Wednesday, Friday - HHRN to change dressing to change dressing Mondays and Fridays and pt will come to [facility] on Wednesdays. Edema Control Wound #1 Left,Lateral Lower Leg o Elevate legs to the level of the heart and pump ankles as often as possible Wound #2 Right,Medial Lower Leg o Elevate legs to the level of the heart and pump ankles as often as possible Additional Orders / Instructions Wound #1 Left,Lateral Lower Leg o Increase protein intake. Wound #2 Right,Medial Lower Leg o Increase protein intake. Home Health Wound #1 [place] Nurse may visit PRN to address patientos wound care needs. o FACE TO FACE ENCOUNTER: MEDICARE and MEDICAID PATIENTS: I certify that this patient is under my care and that I had a face-to-face encounter that meets the physician face-to-face encounter requirements with this patient on this date. The encounter with the patient was in whole or in part for the following MEDICAL CONDITION: (primary reason for [place]) MEDICAL NECESSITY: I certify, that based on my findings, NURSING services are a medically necessary home health service. HOME BOUND STATUS: I certify that my clinical findings support that this patient is homebound (i.e., Due to illness or injury, pt requires aid of supportive devices such as crutches, cane, wheelchairs, walkers, the use of special transportation or the assistance of another person to leave their place of residence. There is a normal inability to leave the home and doing so requires considerable and taxing effort. Other absences are for medical reasons / religious services and are infrequent or of short duration when for other reasons). o If current dressing causes regression in wound condition, may D/C ordered dressing product/s and  apply Normal Saline Moist Dressing daily until next [place] / Other MD appointment. [place] of regression in wound condition at [phone]. o Please  direct any NON-WOUND related issues/requests for orders to patient's Primary Care Physician Wound #2 [place] Nurse may visit PRN to address patientos wound care needs. o FACE TO FACE ENCOUNTER: MEDICARE and MEDICAID PATIENTS: I certify that this patient is under my care and that I had a face-to-face encounter that meets the physician face-to-face encounter requirements with this patient on this date. The encounter with the patient was in whole or in part for the following MEDICAL CONDITION: (primary reason for [place]) MEDICAL NECESSITY: I certify, that based on my findings, NURSING services are a medically necessary home health service. HOME BOUND STATUS: I certify that my clinical findings support that this patient is homebound (i.e., Due to illness or injury, pt requires aid of supportive devices such as crutches, cane, wheelchairs, walkers, the use of special transportation or the assistance of another person to leave their place of residence. There is a normal inability to leave the home and doing so requires considerable and taxing effort. Other absences are for medical reasons / religious services and are infrequent or of short duration when for other reasons). o If current dressing causes regression in wound condition, may D/C ordered dressing product/s and apply Normal Saline Moist Dressing daily until next [place] / Other MD appointment. [place] of regression in wound condition at [phone]. o Please direct any NON-WOUND related issues/requests for orders to patient's Primary Care Physician Ariana [name], Ariana [name] ([id number]) Patient Medications Allergies: aspirin, diazepam, morphine, tetanus  and diphtheria toxoids, adsorbed, adult Notifications Medication Indication Start End lidocaine DOSE 1 - topical 4 % cream - 1 cream topical Electronic Signature(s) Signed: 09/29/2017 4:32:17 PM By: Alric Quan Signed: 10/03/2017 7:28:22 AM By: Linton Ham MD Entered By: Alric Quan on 09/29/2017 09:28:59 Ariana [name] ([id number]) -------------------------------------------------------------------------------- Prescription 09/29/2017 Patient Name: Ariana [name]. Provider: Ricard Dillon MD Date of Birth: 1933/07/26 NPI#: [id number] Sex: F DEA#: [id number] Phone #: [phone] License #: [id number] Patient Address: [place], [place] [address] [address] [place], [place] [address] [phone] Allergies aspirin diazepam morphine tetanus and diphtheria toxoids, adsorbed, adult Medication Medication: Route: Strength: Form: lidocaine 4 % topical cream topical 4% cream Class: TOPICAL LOCAL ANESTHETICS Dose: Frequency / Time: Indication: 1 1 cream topical Number of Refills: Number of Units: 0 Generic Substitution: Start Date: End Date: One Time Use: Substitution Permitted No Note to Pharmacy: [facility]): Date(s): Electronic Signature(s) Ariana [name], Ariana [name] ([id number]) Signed: 09/29/2017 4:32:17 PM By: Alric Quan Signed: 10/03/2017 7:28:22 AM By: Linton Ham MD Entered By: Alric Quan on 09/29/2017 09:29:00 Ariana [name] ([id number]) --------------------------------------------------------------------------------  Problem List Details Patient Name: Ariana [name]. Date of Service: 09/29/2017 8:45 AM Medical Record Number: [id number] Patient Account Number: [id number] Date of Birth/Sex: 22-Jan-1933 (84 y.o. 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 Date of Service: 09/29/2017 8:45 AM Medical Record  Number: [id number] Patient Account Number: [id number] Date of Birth/Sex: 1933-03-14 (84 y.o. Female) Treating RN: Carolyne Fiscal, Debi Primary Care Provider: Fulton Reek Other Clinician: Referring Provider: Fulton Reek Treating Provider/Extender: Tito Dine in Treatment: 2 Subjective History of Present Illness (HPI) 09/15/17; this is an 83 year old woman with a history of type 2 diabetes, chronic ITP for which she follows with oncology. She had a splenectomy in 2007. 3 weeks ago well sitting in the bathroom she fell off the platelet and hit her legs on the bathtub. She had 2 large hematomas. The area on the left has ruptured into a wound where the area on the right medial lower leg is actually still intact. She was given Keflex of concern for coexistent cellulitis on 11/28 the area on the right is still intact and she is not doing anything specific for that. She has a history of small hematomas related to thrombocytopenia on her lower legs although these all seem to have primarily healed. She has a history of falls 09/22/17; this is a patient who had traumatic hematomas on both her legs. When we admitted to  clinic last week the left one had already evacuated whereas the right one had not. She arrived in clinic today with drainage out of the right side. We used Hydrofera Blue to the left 09/29/17; this is a patient who had a traumatic hematoma on both her legs. She has chronic ITP and states she bruises very easily. The right leg hematoma evacuated last week. We have been using Hydrofera Blue to both wound areas on both anterior legs. Today the one on the left is slightly hyper-granulated if the wound stalls we may need to address this. Otherwise the wounds are smaller and look healthy today Objective Constitutional Patient is hypertensive.. Pulse regular and within target range for patient.Marland Kitchen Respirations regular, non-labored and within target range.. Temperature is normal and within the target range for the patient.Marland Kitchen appears in no distress. Vitals Time Taken: 9:02 AM, Height: 65 in, Weight: 133.4 lbs, BMI: 22.2, Temperature: 98.3 F, Pulse: 57 bpm, Respiratory Rate: 18 breaths/min, Blood Pressure: 145/62 mmHg. Eyes Conjunctivae clear. No discharge. Respiratory Respiratory effort is easy and symmetric bilaterally. Rate is normal at rest and on room air.. Cardiovascular Pedal pulses palpable and strong bilaterally.. No edema. Frail skin in her lower extremities. Psychiatric No evidence of depression, anxiety, or agitation. Calm, cooperative, and communicative. Appropriate interactions and affect.. General Notes: Wound exam; wound area on the left leg is hyper granulated there is surrounding epithelialization and it Rens, Avianah E. ([id number]) measures smaller however if this does not continue to improve then addressing the hyper granulation is likely to be necessary The area on the right anterior medial leg which evacuated last week looks healthy in terms of granulation and advancing epithelialization. Improved measurements also noted Integumentary (Hair, Skin) The patient has multiple  ecchymosis and she shows this one on her chest. Wound #1 status is Open. Original cause of wound was Trauma. The wound is located on the Left,Lateral Lower Leg. The wound measures 3.2cm length x 2.2cm width x 0.1cm depth; 5.529cm^2 area and 0.553cm^3 volume. There is no tunneling or undermining noted. There is a large amount of serosanguineous drainage noted. The wound margin is distinct with the outline attached to the wound base. There is large (67-100%) red granulation within the wound bed. There is a small (1-33%) amount of necrotic tissue within the wound bed including Adherent Slough. The periwound skin appearance exhibited: Excoriation, Maceration, Erythema. The surrounding  wound skin color is noted with erythema which is circumferential. Periwound temperature was noted as No Abnormality. The periwound has tenderness on palpation. Wound #2 status is Open. Original cause of wound was Trauma. The wound is located on the Right,Medial Lower Leg. The wound measures 3.5cm length x 2.5cm width x 0.1cm depth; 6.872cm^2 area and 0.687cm^3 volume. There is no tunneling or undermining noted. There is a large amount of serosanguineous drainage noted. The wound margin is distinct with the outline attached to the wound base. There is large (67-100%) red granulation within the wound bed. There is a small (1-33%) amount of necrotic tissue within the wound bed including Adherent Slough. The periwound skin appearance exhibited: Induration, Maceration, Erythema. The surrounding wound skin color is noted with erythema which is circumferential. Periwound temperature was noted as No Abnormality. The periwound has tenderness on palpation. Assessment Active Problems ICD-10 S80.12XD - Contusion of left lower leg, subsequent encounter S80.11XD - Contusion of right lower leg, subsequent encounter D69.3 - Immune thrombocytopenic purpura Plan Wound Cleansing: Wound #1 Left,Lateral Lower Leg: Clean wound with  Normal Saline. Cleanse wound with mild soap and water May Shower, gently pat wound dry prior to applying new dressing. Wound #2 Right,Medial Lower Leg: Clean wound with Normal Saline. Cleanse wound with mild soap and water May Shower, gently pat wound dry prior to applying new dressing. Anesthetic (add to Medication List): Wound #1 Left,Lateral Lower Leg: Topical Lidocaine 4% cream applied to wound bed prior to debridement (In Clinic Only). Wound #2 Right,Medial Lower Leg: Topical Lidocaine 4% cream applied to wound bed prior to debridement (In Clinic Only). Skin Barriers/Peri-Wound Care: Wound #1 Left,Lateral Lower Leg: Ariana [name], [name] ([id number]) Skin Prep Wound #2 Right,Medial Lower Leg: Skin Prep Primary Wound Dressing: Wound #1 Left,Lateral Lower Leg: Hydrafera Blue Other: - adaptic Wound #2 Right,Medial Lower Leg: Hydrafera Blue Other: - adaptic Secondary Dressing: Wound #1 Left,Lateral Lower Leg: Boardered Foam Dressing Wound #2 Right,Medial Lower Leg: Boardered Foam Dressing Dressing Change Frequency: Wound #1 Left,Lateral Lower Leg: Change Dressing Monday, Wednesday, Friday - HHRN to change dressing to change dressing Mondays and Fridays and pt will come to [facility] on Wednesdays. Wound #2 Right,Medial Lower Leg: Change Dressing Monday, Wednesday, Friday - HHRN to change dressing to change dressing Mondays and Fridays and pt will come to [facility] on Wednesdays. Edema Control: Wound #1 Left,Lateral Lower Leg: Elevate legs to the level of the heart and pump ankles as often as possible Wound #2 Right,Medial Lower Leg: Elevate legs to the level of the heart and pump ankles as often as possible Additional Orders / Instructions: Wound #1 Left,Lateral Lower Leg: Increase protein intake. Wound #2 Right,Medial Lower Leg: Increase protein intake. Home Health: Wound #1 Left,Lateral Lower Leg: [place] Nurse may  visit PRN to address patient s wound care needs. FACE TO FACE ENCOUNTER: MEDICARE and MEDICAID PATIENTS: I certify that this patient is under my care and that I had a face-to-face encounter that meets the physician face-to-face encounter requirements with this patient on this date. The encounter with the patient was in whole or in part for the following MEDICAL CONDITION: (primary reason for [place]) MEDICAL NECESSITY: I certify, that based on my findings, NURSING services are a medically necessary home health service. HOME BOUND STATUS: I certify that my clinical findings support that this patient is homebound (i.e., Due to illness or injury, pt requires aid of supportive devices such as crutches, cane, wheelchairs, walkers,  the use of special transportation or the assistance of another person to leave their place of residence. There is a normal inability to leave the home and doing so requires considerable and taxing effort. Other absences are for medical reasons / religious services and are infrequent or of short duration when for other reasons). If current dressing causes regression in wound condition, may D/C ordered dressing product/s and apply Normal Saline Moist Dressing daily until next [place] / Other MD appointment. [place] of regression in wound condition at [phone]. Please direct any NON-WOUND related issues/requests for orders to patient's Primary Care Physician Wound #2 Right,Medial Lower Leg: [place] Nurse may visit PRN to address patient s wound care needs. FACE TO FACE ENCOUNTER: MEDICARE and MEDICAID PATIENTS: I certify that this patient is under my care and that I had a face-to-face encounter that meets the physician face-to-face encounter requirements with this patient on this date. The encounter with the patient was in whole or in part for the following MEDICAL CONDITION: (primary reason for  [place]) MEDICAL NECESSITY: I certify, that based on my findings, NURSING services are a medically necessary home health service. HOME BOUND STATUS: I certify that my clinical findings support that this patient is homebound (i.e., Due to illness or injury, pt requires aid of supportive devices such as crutches, cane, wheelchairs, walkers, the use of special transportation or the assistance of another person to leave their place of residence. There is a normal inability to leave the home and doing so requires considerable and taxing effort. Other absences are for medical reasons / religious services and MARYTZA, Ariana [name]. ([id number]) are infrequent or of short duration when for other reasons). If current dressing causes regression in wound condition, may D/C ordered dressing product/s and apply Normal Saline Moist Dressing daily until next [place] / Other MD appointment. [place] of regression in wound condition at [phone]. Please direct any NON-WOUND related issues/requests for orders to patient's Primary Care Physician The following medication(s) was prescribed: lidocaine topical 4 % cream 1 1 cream topical #1 I'm going to continue with the [facility] #2 careful attention to the area on the left leg noticed weak with regards to the hyper-granulation and measurements. If this does not continue to improve she may need either chemical or mechanical cauterization of the hyper-granulation tissue. 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

## 2017-10-03 NOTE — ED Notes (Addendum)
Pt reports spontaneous nosebleed from left nostril that started about 30 min pta; pt says she was sitting at her computer when the bleeding started; since then she has some pain to the back of her head; denies feeling bad prior to nosebleed; history of low platelets and gets checked monthly; last check was 12/20; count was 35 and pt received NPlate injection; talking in complete coherent sentences; alert and oriented x 3; son at bedside

## 2017-10-03 NOTE — Progress Notes (Signed)
BRYNLYNN, WALKO ([id number]) Visit Report for 09/29/2017 Arrival Information Details Patient Name: Ariana [name], Ariana [name]. Date of Service: 09/29/2017 8:45 AM Medical Record Number: [id number] Patient Account Number: [id number] Date of Birth/Sex: 09/28/1933 (84 y.o. Female) Treating RN: Ahmed Prima Primary Care Kyeshia Zinn: Fulton Reek Other Clinician: Referring Austin Pongratz: Fulton Reek Treating Fahad Cisse/Extender: Tito Dine in Treatment: 2 Visit Information History Since Last Visit All ordered tests and consults were completed: No Patient Arrived: Ambulatory Added or deleted any medications: No Arrival Time: 09:01 Any new allergies or adverse reactions: No Accompanied By: husband Had a fall or experienced change in No Transfer Assistance: None activities of daily living that may affect Patient Identification Verified: Yes risk of falls: Secondary Verification Process Completed: Yes Signs or symptoms of abuse/neglect since last visito No Patient Requires Transmission-Based No Hospitalized since last visit: No Precautions: Has Dressing in Place as Prescribed: Yes Patient Has Alerts: No Pain Present Now: No Electronic Signature(s) Signed: 09/29/2017 4:32:17 PM By: Alric Quan Entered By: Alric Quan on 09/29/2017 09:02:31 Ariana [name] ([id number]) -------------------------------------------------------------------------------- Clinic Level of Care Assessment Details Patient Name: Ariana [name] Date of Service: 09/29/2017 8:45 AM Medical Record Number: [id number] Patient Account Number: [id number] Date of Birth/Sex: 11/28/1932 (84 y.o. Female) Treating RN: Carolyne Fiscal, Debi Primary Care Leolia Vinzant: Fulton Reek Other Clinician: Referring Rodgers Likes: Fulton Reek Treating Maxwell Martorano/Extender: Tito Dine in Treatment: 2 Clinic Level of Care Assessment Items TOOL 4 Quantity Score X - Use when only an EandM is performed on  FOLLOW-UP visit 1 0 ASSESSMENTS - Nursing Assessment / Reassessment X - Reassessment of Co-morbidities (includes updates in patient status) 1 10 X- 1 5 Reassessment of Adherence to Treatment Plan ASSESSMENTS - Wound and Skin Assessment / Reassessment []  - Simple Wound Assessment / Reassessment - one wound 0 X- 2 5 Complex Wound Assessment / Reassessment - multiple wounds []  - 0 Dermatologic / Skin Assessment (not related to wound area) ASSESSMENTS - Focused Assessment []  - Circumferential Edema Measurements - multi extremities 0 []  - 0 Nutritional Assessment / Counseling / Intervention []  - 0 Lower Extremity Assessment (monofilament, tuning fork, pulses) []  - 0 Peripheral Arterial Disease Assessment (using hand held doppler) ASSESSMENTS - Ostomy and/or Continence Assessment and Care []  - Incontinence Assessment and Management 0 []  - 0 Ostomy Care Assessment and Management (repouching, etc.) PROCESS - Coordination of Care []  - Simple Patient / Family Education for ongoing care 0 X- 1 20 Complex (extensive) Patient / Family Education for ongoing care X- 1 10 Staff obtains Programmer, systems, Records, Test Results / Process Orders X- 1 10 Staff telephones HHA, Nursing Homes / Clarify orders / etc []  - 0 Routine Transfer to another Facility (non-emergent condition) []  - 0 Routine Hospital Admission (non-emergent condition) []  - 0 New Admissions / Biomedical engineer / Ordering NPWT, Apligraf, etc. []  - 0 Emergency Hospital Admission (emergent condition) X- 1 10 Simple Discharge Coordination Ariana [name], Ariana [name]. ([id number]) []  - 0 Complex (extensive) Discharge Coordination PROCESS - Special Needs []  - Pediatric / Minor Patient Management 0 []  - 0 Isolation Patient Management []  - 0 Hearing / Language / Visual special needs []  - 0 Assessment of Community assistance (transportation, D/C planning, etc.) []  - 0 Additional assistance / Altered mentation []  - 0 Support  Surface(s) Assessment (bed, cushion, seat, etc.) INTERVENTIONS - Wound Cleansing / Measurement []  - Simple Wound Cleansing - one wound 0 X- 2 5 Complex Wound Cleansing - multiple wounds X- 1 5 Wound Imaging (photographs - any number  of wounds) []  - 0 Wound Tracing (instead of photographs) []  - 0 Simple Wound Measurement - one wound X- 2 5 Complex Wound Measurement - multiple wounds INTERVENTIONS - Wound Dressings X - Small Wound Dressing one or multiple wounds 2 10 []  - 0 Medium Wound Dressing one or multiple wounds []  - 0 Large Wound Dressing one or multiple wounds X- 1 5 Application of Medications - topical []  - 0 Application of Medications - injection INTERVENTIONS - Miscellaneous []  - External ear exam 0 []  - 0 Specimen Collection (cultures, biopsies, blood, body fluids, etc.) []  - 0 Specimen(s) / Culture(s) sent or taken to Lab for analysis []  - 0 Patient Transfer (multiple staff / Civil Service fast streamer / Similar devices) []  - 0 Simple Staple / Suture removal (25 or less) []  - 0 Complex Staple / Suture removal (26 or more) []  - 0 Hypo / Hyperglycemic Management (close monitor of Blood Glucose) []  - 0 Ankle / Brachial Index (ABI) - do not check if billed separately X- 1 5 Vital Signs Sliwa, Glorimar E. ([id number]) Has the patient been seen at the hospital within the last three years: Yes Total Score: 130 Level Of Care: New/Established - Level 4 Electronic Signature(s) Signed: 09/29/2017 4:32:17 PM By: Alric Quan Entered By: Alric Quan on 09/29/2017 09:33:03 Ariana [name] ([id number]) -------------------------------------------------------------------------------- Encounter Discharge Information Details Patient Name: Ariana [name]. Date of Service: 09/29/2017 8:45 AM Medical Record Number: [id number] Patient Account Number: [id number] Date of Birth/Sex: 31-Aug-1933 (84 y.o. Female) Treating RN: Carolyne Fiscal, Debi Primary Care Ryle Buscemi: Fulton Reek Other Clinician: Referring Keiri Solano: Fulton Reek Treating Elsa Ploch/Extender: Tito Dine in Treatment: 2 Encounter Discharge Information Items Discharge Pain Level: 0 Discharge Condition: Stable Ambulatory Status: Ambulatory Discharge Destination: Home Transportation: Private Auto Accompanied By: husband Schedule Follow-up Appointment: Yes Medication Reconciliation completed and No provided to Patient/Care Scottlyn Mchaney: Provided on Clinical Summary of Care: 09/29/2017 Form Type Recipient Paper Patient BI Electronic Signature(s) Signed: 09/30/2017 11:26:48 AM By: Ruthine Dose Previous Signature: 09/29/2017 9:16:00 AM Version By: Alric Quan Entered By: Ruthine Dose on 09/29/2017 09:30:08 Ariana [name] ([id number]) -------------------------------------------------------------------------------- Lower Extremity Assessment Details Patient Name: Ariana [name]. Date of Service: 09/29/2017 8:45 AM Medical Record Number: [id number] Patient Account Number: [id number] Date of Birth/Sex: Feb 12, 1933 (84 y.o. Female) Treating RN: Carolyne Fiscal, Debi Primary Care Lior Cartelli: Fulton Reek Other Clinician: Referring Kailyn Dubie: Fulton Reek Treating Nelsie Domino/Extender: Tito Dine in Treatment: 2 Vascular Assessment Pulses: Dorsalis Pedis Palpable: [Left:Yes] [Right:Yes] Posterior Tibial Extremity colors, hair growth, and conditions: Extremity Color: [Left:Mottled] [Right:Mottled] Temperature of Extremity: [Left:Warm] [Right:Warm] Capillary Refill: [Left:> 3 seconds] [Right:> 3 seconds] Toe Nail Assessment Left: Right: Thick: No No Discolored: No No Deformed: No No Improper Length and Hygiene: No No Electronic Signature(s) Signed: 09/29/2017 4:32:17 PM By: Alric Quan Entered By: Alric Quan on 09/29/2017 09:14:31 Ariana [name]  ([id number]) -------------------------------------------------------------------------------- Multi Wound Chart Details Patient Name: Ariana [name]. Date of Service: 09/29/2017 8:45 AM Medical Record Number: [id number] Patient Account Number: [id number] Date of Birth/Sex: 1933-06-28 (84 y.o. Female) Treating RN: Carolyne Fiscal, Debi Primary Care [name]: Fulton Reek Other Clinician: Referring Navil Kole: Fulton Reek Treating Donya Tomaro/Extender: Tito Dine in Treatment: 2 Vital Signs Height(in): 65 Pulse(bpm): 17 Weight(lbs): 133.4 Blood Pressure(mmHg): 145/62 Body Mass Index(BMI): 22 Temperature(F): 98.3 Respiratory Rate 18 (breaths/min): Photos: [1:No Photos] [2:No Photos] [N/A:N/A] Wound Location: [1:Left Lower Leg - Lateral] [2:Right Lower Leg - Medial] [N/A:N/A] Wounding Event: [1:Trauma] [2:Trauma] [N/A:N/A] Primary Etiology: [1:Trauma, Other] [2:Trauma, Other] [N/A:N/A] Comorbid History: [1:Cataracts] [  2:Cataracts] [N/A:N/A] Date Acquired: [1:08/25/2017] [2:08/25/2017] [N/A:N/A] Weeks of Treatment: [1:2] [2:2] [N/A:N/A] Wound Status: [1:Open] [2:Open] [N/A:N/A] Measurements L x W x D [1:3.2x2.2x0.1] [2:3.5x2.5x0.1] [N/A:N/A] (cm) Area (cm) : [1:5.529] [2:6.872] [N/A:N/A] Volume (cm) : [1:0.553] [2:0.687] [N/A:N/A] % Reduction in Area: [1:53.70%] [2:47.40%] [N/A:N/A] % Reduction in Volume: [1:53.60%] [2:47.50%] [N/A:N/A] Classification: [1:Partial Thickness] [2:Partial Thickness] [N/A:N/A] Exudate Amount: [1:Large] [2:Large] [N/A:N/A] Exudate Type: [1:Serosanguineous] [2:Serosanguineous] [N/A:N/A] Exudate Color: [1:red, brown] [2:red, brown] [N/A:N/A] Wound Margin: [1:Distinct, outline attached] [2:Distinct, outline attached] [N/A:N/A] Granulation Amount: [1:Large (67-100%)] [2:Large (67-100%)] [N/A:N/A] Granulation Quality: [1:Red] [2:Red] [N/A:N/A] Necrotic Amount: [1:Small (1-33%)] [2:Small (1-33%)] [N/A:N/A] Exposed Structures: [1:Fascia:  No Fat Layer (Subcutaneous Tissue) Exposed: No Tendon: No Muscle: No Joint: No Bone: No] [2:Fascia: No Fat Layer (Subcutaneous Tissue) Exposed: No Tendon: No Muscle: No Joint: No Bone: No] [N/A:N/A] Epithelialization: [1:None] [2:None] [N/A:N/A] Periwound Skin Texture: [1:Excoriation: Yes] [2:Induration: Yes] [N/A:N/A] Periwound Skin Moisture: [1:Maceration: Yes] [2:Maceration: Yes] [N/A:N/A] Periwound Skin Color: [1:Erythema: Yes] [2:Erythema: Yes] [N/A:N/A] Erythema Location: [1:Circumferential] [2:Circumferential] [N/A:N/A] Temperature: [1:No Abnormality] [2:No Abnormality] [N/A:N/A] Tenderness on Palpation: [1:Yes] [2:Yes] [N/A:N/A] Wound Preparation: [N/A:N/A] Ulcer Cleansing: Ulcer Cleansing: Rinsed/Irrigated with Saline Rinsed/Irrigated with Saline Topical Anesthetic Applied: Topical Anesthetic Applied: Other: lidocaine 4% Other: lidocaine 4% Treatment Notes Electronic Signature(s) Signed: 10/03/2017 7:28:22 AM By: Linton Ham MD Entered By: Linton Ham on 09/29/2017 09:23:29 Ariana [name] ([id number]) -------------------------------------------------------------------------------- Multi-Disciplinary Care Plan Details Patient Name: Ariana [name] Date of Service: 09/29/2017 8:45 AM Medical Record Number: [id number] Patient Account Number: [id number] Date of Birth/Sex: December 20, 1932 (84 y.o. Female) Treating RN: Carolyne Fiscal, Debi Primary Care Janiah Devinney: Fulton Reek Other Clinician: Referring Kiora Hallberg: Fulton Reek Treating Seng Larch/Extender: Tito Dine in Treatment: 2 Active Inactive ` Abuse / Safety / Falls / Self Care Management Nursing Diagnoses: History of Falls Potential for falls Goals: Patient will not experience any injury related to falls Date Initiated: 09/15/2017 Target Resolution Date: 01/15/2018 Goal Status: Active Interventions: Assess Activities of Daily Living upon admission and as needed Assess fall risk on admission  and as needed Assess: immobility, friction, shearing, incontinence upon admission and as needed Assess impairment of mobility on admission and as needed per policy Assess personal safety and home safety (as indicated) on admission and as needed Assess self care needs on admission and as needed Notes: ` Nutrition Nursing Diagnoses: Imbalanced nutrition Potential for alteratiion in Nutrition/Potential for imbalanced nutrition Goals: Patient/caregiver agrees to and verbalizes understanding of need to use nutritional supplements and/or vitamins as prescribed Date Initiated: 09/15/2017 Target Resolution Date: 01/15/2018 Goal Status: Active Interventions: Assess patient nutrition upon admission and as needed per policy Notes: ` Orientation to the Wound Care Program Nursing Diagnoses: KEELEE, YANKEY ([id number]) Knowledge deficit related to the wound healing center program Goals: Patient/caregiver will verbalize understanding of the [facility] Date Initiated: 09/15/2017 Target Resolution Date: 01/15/2018 Goal Status: Active Interventions: Provide education on orientation to the wound center Notes: ` Pain, Acute or Chronic Nursing Diagnoses: Pain, acute or chronic: actual or potential Potential alteration in comfort, pain Goals: Patient/caregiver will verbalize adequate pain control between visits Date Initiated: 09/15/2017 Target Resolution Date: 01/15/2018 Goal Status: Active Interventions: Complete pain assessment as per visit requirements Notes: ` Wound/Skin Impairment Nursing Diagnoses: Impaired tissue integrity Knowledge deficit related to ulceration/compromised skin integrity Goals: Ulcer/skin breakdown will have a volume reduction of 80% by week 12 Date Initiated: 09/15/2017 Target Resolution Date: 01/15/2018 Goal Status: Active Interventions: Assess patient/caregiver ability to perform ulcer/skin care regimen upon admission and as needed Assess  ulceration(s) every visit Notes: Electronic Signature(s) Signed: 09/29/2017 4:32:17 PM By: Alric Quan Entered By: Alric Quan on 09/29/2017 09:14:40 Ariana [name] ([id number]) -------------------------------------------------------------------------------- Pain Assessment Details Patient Name: Ariana [name] Date of Service: 09/29/2017 8:45 AM Medical Record Number: [id number] Patient Account Number: [id number] Date of Birth/Sex: Jun 23, 1933 (84 y.o. Female) Treating RN: Carolyne Fiscal, Debi Primary Care Blessin Kanno: Fulton Reek Other Clinician: Referring Dilraj Killgore: Fulton Reek Treating Teigan Sahli/Extender: Tito Dine in Treatment: 2 Active Problems Location of Pain Severity and Description of Pain Patient Has Paino No Site Locations Pain Management and Medication Current Pain Management: Electronic Signature(s) Signed: 09/29/2017 4:32:17 PM By: Alric Quan Entered By: Alric Quan on 09/29/2017 09:02:35 Ariana [name] ([id number]) -------------------------------------------------------------------------------- Patient/Caregiver Education Details Patient Name: Ariana [name] Date of Service: 09/29/2017 8:45 AM Medical Record Number: [id number] Patient Account Number: [id number] Date of Birth/Gender: 1933-03-09 (84 y.o. Female) Treating RN: Ahmed Prima Primary Care Physician: Fulton Reek Other Clinician: Referring Physician: Fulton Reek Treating Physician/Extender: Tito Dine in Treatment: 2 Education Assessment Education Provided To: Patient Education Topics Provided Wound/Skin Impairment: Handouts: Other: change dressing as ordered Methods: Demonstration, Explain/Verbal Responses: State content correctly Electronic Signature(s) Signed: 09/29/2017 4:32:17 PM By: Alric Quan Entered By: Alric Quan on 09/29/2017 09:16:13 Ariana [name]  ([id number]) -------------------------------------------------------------------------------- Wound Assessment Details Patient Name: Ariana [name]. Date of Service: 09/29/2017 8:45 AM Medical Record Number: [id number] Patient Account Number: [id number] Date of Birth/Sex: Apr 13, 1933 (84 y.o. Female) Treating RN: Carolyne Fiscal, Debi Primary Care Lynnelle Mesmer: Fulton Reek Other Clinician: Referring Naelani Lafrance: Fulton Reek Treating Lindamarie Maclachlan/Extender: Tito Dine in Treatment: 2 Wound Status Wound Number: 1 Primary Etiology: Trauma, Other Wound Location: Left Lower Leg - Lateral Wound Status: Open Wounding Event: Trauma Comorbid History: Cataracts Date Acquired: 08/25/2017 Weeks Of Treatment: 2 Clustered Wound: No Photos Photo Uploaded By: Alric Quan on 09/29/2017 16:30:16 Wound Measurements Length: (cm) 3.2 Width: (cm) 2.2 Depth: (cm) 0.1 Area: (cm) 5.529 Volume: (cm) 0.553 % Reduction in Area: 53.7% % Reduction in Volume: 53.6% Epithelialization: None Tunneling: No Undermining: No Wound Description Classification: Partial Thickness Wound Margin: Distinct, outline attached Exudate Amount: Large Exudate Type: Serosanguineous Exudate Color: red, brown Foul Odor After Cleansing: No Slough/Fibrino Yes Wound Bed Granulation Amount: Large (67-100%) Exposed Structure Granulation Quality: Red Fascia Exposed: No Necrotic Amount: Small (1-33%) Fat Layer (Subcutaneous Tissue) Exposed: No Necrotic Quality: Adherent Slough Tendon Exposed: No Muscle Exposed: No Joint Exposed: No Bone Exposed: No Periwound Skin Texture Blankenship, Miryah E. ([id number]) Texture Color No Abnormalities Noted: No No Abnormalities Noted: No Excoriation: Yes Erythema: Yes Erythema Location: Circumferential Moisture No Abnormalities Noted: No Temperature / Pain Maceration: Yes Temperature: No Abnormality Tenderness on Palpation: Yes Wound Preparation Ulcer Cleansing:  Rinsed/Irrigated with Saline Topical Anesthetic Applied: Other: lidocaine 4%, Treatment Notes Wound #1 (Left, Lateral Lower Leg) 1. Cleansed with: Clean wound with Normal Saline 2. Anesthetic Topical Lidocaine 4% cream to wound bed prior to debridement 4. Dressing Applied: Hydrafera Blue 5. Secondary Dressing Applied Bordered Foam Dressing Notes adaptic Electronic Signature(s) Signed: 09/29/2017 4:32:17 PM By: Alric Quan Entered By: Alric Quan on 09/29/2017 09:12:30 Ariana [name] ([id number]) -------------------------------------------------------------------------------- Wound Assessment Details Patient Name: Ariana [name] Date of Service: 09/29/2017 8:45 AM Medical Record Number: [id number] Patient Account Number: [id number] Date of Birth/Sex: [birth year] (84 y.o. Female) Treating RN: Ahmed Prima Primary Care Avin Gibbons: Fulton Reek Other Clinician: Referring Aja Bolander: Fulton Reek Treating Abubakr Wieman/Extender: Tito Dine in Treatment: 2 Wound Status Wound Number: 2 Primary Etiology: Trauma, Other Wound Location:  Right Lower Leg - Medial Wound Status: Open Wounding Event: Trauma Comorbid History: Cataracts Date Acquired: 08/25/2017 Weeks Of Treatment: 2 Clustered Wound: No Photos Photo Uploaded By: Alric Quan on 09/29/2017 16:30:16 Wound Measurements Length: (cm) 3.5 Width: (cm) 2.5 Depth: (cm) 0.1 Area: (cm) 6.872 Volume: (cm) 0.687 % Reduction in Area: 47.4% % Reduction in Volume: 47.5% Epithelialization: None Tunneling: No Undermining: No Wound Description Classification: Partial Thickness Wound Margin: Distinct, outline attached Exudate Amount: Large Exudate Type: Serosanguineous Exudate Color: red, brown Foul Odor After Cleansing: No Slough/Fibrino Yes Wound Bed Granulation Amount: Large (67-100%) Exposed Structure Granulation Quality: Red Fascia Exposed: No Necrotic Amount: Small (1-33%) Fat  Layer (Subcutaneous Tissue) Exposed: No Necrotic Quality: Adherent Slough Tendon Exposed: No Muscle Exposed: No Joint Exposed: No Bone Exposed: No Periwound Skin Texture Frayne, Michol E. ([id number]) Texture Color No Abnormalities Noted: No No Abnormalities Noted: No Induration: Yes Erythema: Yes Erythema Location: Circumferential Moisture No Abnormalities Noted: No Temperature / Pain Maceration: Yes Temperature: No Abnormality Tenderness on Palpation: Yes Wound Preparation Ulcer Cleansing: Rinsed/Irrigated with Saline Topical Anesthetic Applied: Other: lidocaine 4%, Treatment Notes Wound #2 (Right, Medial Lower Leg) 1. Cleansed with: Clean wound with Normal Saline 2. Anesthetic Topical Lidocaine 4% cream to wound bed prior to debridement 4. Dressing Applied: Hydrafera Blue 5. Secondary Dressing Applied Bordered Foam Dressing Notes adaptic Electronic Signature(s) Signed: 09/29/2017 4:32:17 PM By: Alric Quan Entered By: Alric Quan on 09/29/2017 09:13:50 Ariana [name] ([id number]) -------------------------------------------------------------------------------- Vitals Details Patient Name: Ariana [name] Date of Service: 09/29/2017 8:45 AM Medical Record Number: [id number] Patient Account Number: [id number] Date of Birth/Sex: [birth year] (84 y.o. Female) Treating RN: Carolyne Fiscal, Debi Primary Care Dana Debo: Fulton Reek Other Clinician: Referring Erminio Nygard: Fulton Reek Treating Arlynn Mcdermid/Extender: Tito Dine in Treatment: 2 Vital Signs Time Taken: 09:02 Temperature (F): 98.3 Height (in): 65 Pulse (bpm): 57 Weight (lbs): 133.4 Respiratory Rate (breaths/min): 18 Body Mass Index (BMI): 22.2 Blood Pressure (mmHg): 145/62 Reference Range: 80 - 120 mg / dl Electronic Signature(s) Signed: 09/29/2017 4:32:17 PM By: Alric Quan Entered By: Alric Quan on 09/29/2017 09:04:21

## 2017-10-03 NOTE — ED Triage Notes (Signed)
Pt with nosebleed that began approx 30 min pta. Nasal clamp applied in triage. Bleeding appears to be coming from left nare. Pt with history of low platelets.

## 2017-10-04 DIAGNOSIS — F0390 Unspecified dementia without behavioral disturbance: Secondary | ICD-10-CM | POA: Diagnosis not present

## 2017-10-04 DIAGNOSIS — S8011XD Contusion of right lower leg, subsequent encounter: Secondary | ICD-10-CM | POA: Diagnosis not present

## 2017-10-04 DIAGNOSIS — I10 Essential (primary) hypertension: Secondary | ICD-10-CM | POA: Diagnosis not present

## 2017-10-04 DIAGNOSIS — D693 Immune thrombocytopenic purpura: Secondary | ICD-10-CM | POA: Diagnosis not present

## 2017-10-04 DIAGNOSIS — S8012XD Contusion of left lower leg, subsequent encounter: Secondary | ICD-10-CM | POA: Diagnosis not present

## 2017-10-04 DIAGNOSIS — I4891 Unspecified atrial fibrillation: Secondary | ICD-10-CM | POA: Diagnosis not present

## 2017-10-04 DIAGNOSIS — M5030 Other cervical disc degeneration, unspecified cervical region: Secondary | ICD-10-CM | POA: Diagnosis not present

## 2017-10-04 DIAGNOSIS — J45909 Unspecified asthma, uncomplicated: Secondary | ICD-10-CM | POA: Diagnosis not present

## 2017-10-04 DIAGNOSIS — M5136 Other intervertebral disc degeneration, lumbar region: Secondary | ICD-10-CM | POA: Diagnosis not present

## 2017-10-04 LAB — COMPREHENSIVE METABOLIC PANEL
ALBUMIN: 3.8 g/dL (ref 3.5–5.0)
ALT: 13 U/L — ABNORMAL LOW (ref 14–54)
ANION GAP: 5 (ref 5–15)
AST: 22 U/L (ref 15–41)
Alkaline Phosphatase: 74 U/L (ref 38–126)
BUN: 25 mg/dL — ABNORMAL HIGH (ref 6–20)
CO2: 28 mmol/L (ref 22–32)
Calcium: 8.9 mg/dL (ref 8.9–10.3)
Chloride: 107 mmol/L (ref 101–111)
Creatinine, Ser: 0.96 mg/dL (ref 0.44–1.00)
GFR calc Af Amer: >60 mL/min (ref >60)
GFR calc non Af Amer: 54 mL/min — ABNORMAL LOW (ref >60)
GLUCOSE: 82 mg/dL (ref 65–99)
POTASSIUM: 3.7 mmol/L (ref 3.5–5.1)
SODIUM: 140 mmol/L (ref 135–145)
TOTAL PROTEIN: 6.7 g/dL (ref 6.5–8.1)
Total Bilirubin: 0.5 mg/dL (ref 0.3–1.2)

## 2017-10-04 LAB — CBC
HCT: 34.6 % — ABNORMAL LOW (ref 35.0–47.0)
Hemoglobin: 11.4 g/dL — ABNORMAL LOW (ref 12.0–16.0)
MCH: 28.9 pg (ref 26.0–34.0)
MCHC: 33 g/dL (ref 32.0–36.0)
MCV: 87.7 fL (ref 80.0–100.0)
PLATELETS: 18 10*3/uL — AB (ref 150–440)
RBC: 3.95 MIL/uL (ref 3.80–5.20)
RDW: 18.3 % — ABNORMAL HIGH (ref 11.5–14.5)
WBC: 10.7 10*3/uL (ref 3.6–11.0)

## 2017-10-04 LAB — PROTIME-INR
INR: 0.93
PROTHROMBIN TIME: 12.4 s (ref 11.4–15.2)

## 2017-10-04 MED ORDER — OXYMETAZOLINE HCL 0.05 % NA SOLN
1.0000 | Freq: Once | NASAL | Status: AC
  Administered 2017-10-04: 1 via NASAL
  Filled 2017-10-04: qty 15

## 2017-10-04 NOTE — Discharge Instructions (Signed)
As we discussed please attempt Afrin use and nasal clamping if you have any further nosebleed at home.  If it fails to resolve with clamping or if you develop bleeding in any other site such as black or bloody stool please return to the emergency department for repeat evaluation.  Otherwise please follow-up with Dr. Grayland Ormond this week for recheck/reevaluation.

## 2017-10-04 NOTE — ED Provider Notes (Signed)
[facility] Emergency Department Provider Note  Time seen: 12:21 AM  I have reviewed the triage vital signs and the nursing notes.   HISTORY  Chief Complaint Epistaxis    HPI Infant Ariana [name] is a 83 y.o. female with a past medical history of anemia, anxiety, chronic pain, thrombocytopenia, presents to the emergency department for nosebleed.  According to the patient around 10:00 tonight she developed a nosebleed that would not stop at home.  States she has never had a nosebleed previously.  Patient has a history of thrombocytopenia and has had multiple platelet transfusions in the past.  Patient denies any other bleeding, denies lack of bloody stool or bleeding when she brushes her teeth.   Past Medical History:  Diagnosis Date  . Anemia   . Anxiety   . Asthma   . Chronic back pain   . Depression   . Depression   . GERD (gastroesophageal reflux disease)   . Hiatal hernia   . Hypercholesteremia   . ITP (idiopathic thrombocytopenic purpura)   . Osteoarthritis   . Osteoporosis   . Pulmonary emboli ([place])   . Restless legs     Patient Active Problem List   Diagnosis Date Noted  . Thrombocytopenia ([place]) 02/18/2017  . Asthma without status asthmaticus 11/25/2016  . Late onset Alzheimer's disease without behavioral disturbance 12/30/2015  . Back muscle spasm 09/09/2015  . Clinical depression 04/12/2015  . Herpes zona 04/12/2015  . Chronic ITP (idiopathic thrombocytopenic purpura) (H[facility]) 03/15/2015  . Chest pain 10/22/2014  . Breathlessness on exertion 10/22/2014  . Osteopenia 10/18/2014  . Benign essential HTN 06/28/2014  . HLD (hyperlipidemia) 06/28/2014  . Idiopathic thrombocythemia ([place]) 06/28/2014  . Acquired hypothyroidism 06/04/2014  . Anxiety and depression 06/04/2014  . Bursitis, trochanteric 03/27/2014  . DDD (degenerative disc disease), lumbar 02/27/2014  . Neuritis or radiculitis due to rupture of lumbar intervertebral disc  02/27/2014    Past Surgical History:  Procedure Laterality Date  . CARPAL TUNNEL RELEASE    . ESOPHAGOGASTRODUODENOSCOPY (EGD) WITH PROPOFOL N/A 09/10/2015   Procedure: ESOPHAGOGASTRODUODENOSCOPY (EGD) WITH PROPOFOL;  Surgeon: Lollie Sails, MD;  Location: [facility] ENDOSCOPY;  Service: Endoscopy;  Laterality: N/A;  . LUMBAR [place]    . SPLENECTOMY, PARTIAL    . TOTAL ABDOMINAL HYSTERECTOMY      Prior to Admission medications   Medication Sig Start Date End Date Taking? Authorizing Provider  acetaminophen (TYLENOL) 325 MG tablet Take 2 tablets (650 mg total) by mouth every 6 (six) hours as needed for mild pain (or Fever >/= 101). 07/02/15   Idelle Crouch, MD  albuterol (PROVENTIL HFA;VENTOLIN HFA) 108 (90 BASE) MCG/ACT inhaler Inhale 2 puffs into the lungs every 6 (six) hours as needed for wheezing or shortness of breath.    [provider]  ALPRAZolam Duanne Moron) 0.5 MG tablet Take by mouth. 03/11/17   [provider]  Calcium Carbonate-Vitamin D (CALCIUM 600+D) 600-400 MG-UNIT per tablet Take 1 tablet by mouth daily.    [provider]  citalopram (CELEXA) 40 MG tablet Take 40 mg by mouth daily.    [provider]  Cyanocobalamin (RA VITAMIN B-12 TR) 1000 MCG TBCR Take 1,000 mcg by mouth daily.     [provider]  donepezil (ARICEPT) 5 MG tablet Take 5 mg by mouth daily. 10/18/14   [provider]  fluticasone (FLONASE) 50 MCG/ACT nasal spray Place into the nose. 03/03/17   [provider]  Fluticasone-Salmeterol (ADVAIR) 250-50 MCG/DOSE AEPB  Inhale 1 puff into the lungs daily as needed.     [provider]  gabapentin (NEURONTIN) 300 MG capsule 300mg  orally two times a day 07/23/14   [provider]  HYDROcodone-acetaminophen (NORCO) 7.5-325 MG tablet 1 tablet by mouth q 6 h prn 11/14/16   [provider]  levothyroxine (SYNTHROID, LEVOTHROID) 50 MCG tablet Take 50 mcg by mouth daily before  breakfast. Take 30 to 60 minutes before breakfast.    [provider]  LORazepam (ATIVAN) 0.5 MG tablet Take 1 tablet (0.5 mg total) by mouth every 8 (eight) hours as needed for anxiety. 11/22/16 11/22/17  Earleen Newport, MD  memantine (NAMENDA) 5 MG tablet Take 5 mg by mouth 2 (two) times daily.  08/19/15 03/23/17  [provider]  montelukast (SINGULAIR) 10 MG tablet Take by mouth. 03/16/17 03/16/18  [provider]  Multiple Vitamins-Minerals (HAIR SKIN AND NAILS FORMULA PO) Take 1 tablet by mouth every evening.    [provider]  Multiple Vitamins-Minerals (OCUVITE-LUTEIN PO) Take 1 capsule by mouth daily.     [provider]  pantoprazole (PROTONIX) 20 MG tablet Take 20 mg by mouth 2 (two) times daily.  07/24/15   [provider]  phenazopyridine (PYRIDIUM) 200 MG tablet Take 1 tablet (200 mg total) by mouth 3 (three) times daily as needed for pain. 08/31/17   Jacquelin Hawking, NP  polycarbophil (FIBERCON) 625 MG tablet Take by mouth. 03/02/17 03/02/18  [provider]  pravastatin (PRAVACHOL) 20 MG tablet Take 20 mg by mouth daily.    [provider]  traMADol (ULTRAM) 50 MG tablet Take 50 mg by mouth 3 (three) times daily as needed for moderate pain or severe pain. pain    [provider]  vitamin C (ASCORBIC ACID) 500 MG tablet Take 500 mg by mouth daily.    [provider]  vitamin E (E-400) 400 UNIT capsule Take 400 Units by mouth daily.    [provider]    Allergies  Allergen Reactions  . Aspirin     Other reaction(s): Distress (finding)  . Diazepam     Other reaction(s): Itching of Skin sneezing  . Morphine     Other reaction(s): Itching of Skin  . Other     Other reaction(s): Unknown seasonal allergies  . Tetanus Toxoids     Other reaction(s): Localized superficial swelling of skin    Family History  Problem Relation Age of Onset  . Stroke Mother     Social  History Social History   Tobacco Use  . Smoking status: Never Smoker  . Smokeless tobacco: Never Used  Substance Use Topics  . Alcohol use: No    Alcohol/week: 0.0 oz  . Drug use: No    Review of Systems Constitutional: Negative for fever. ENT: Negative for bleeding when she brushes her teeth.  Positive for nosebleed. Cardiovascular: Negative for chest pain. Respiratory: Negative for shortness of breath. Gastrointestinal: Negative for black or bloody stool. All other ROS negative  ____________________________________________   PHYSICAL EXAM:  VITAL SIGNS: ED Triage Vitals  Enc Vitals Group     BP 10/03/17 2255 (!) 157/70     Pulse Rate 10/03/17 2255 61     Resp 10/03/17 2255 16     Temp 10/03/17 2255 98 F (36.7 C)     Temp Source 10/03/17 2255 Oral     SpO2 10/03/17 2255 96 %     Weight 10/03/17 2256 130 lb (59 kg)  Height 10/03/17 2256 5\' 2"  (1.575 m)     Head Circumference --      Peak Flow --      Pain Score 10/03/17 2255 0     Pain Loc --      Pain Edu? --      Excl. in [place]? --    Constitutional: Alert and oriented. Well appearing and in no distress. Eyes: Normal exam ENT   Head: Normocephalic and atraumatic.   Nose: Dried blood with mild clot in left nostril.  Clear right nostril.   Mouth/Throat: Mucous membranes are moist. Cardiovascular: Normal rate, regular rhythm. No murmur Respiratory: Normal respiratory effort without tachypnea nor retractions. Breath sounds are clear  Gastrointestinal: Soft and nontender. No distention.   Musculoskeletal: Nontender with normal range of motion in all extremities.  Neurologic:  Normal speech and language. No gross focal neurologic deficits Skin:  Skin is warm, dry and intact.  Psychiatric: Mood and affect are normal.   ____________________________________________  INITIAL IMPRESSION / ASSESSMENT AND PLAN / ED COURSE  Pertinent labs & imaging results that were available during my care of the patient  were reviewed by me and considered in my medical decision making (see chart for details).  Patient presents to the emergency department for epistaxis.  History of thrombocytopenia.  Differential would include epistaxis, significant thrombocytopenia. We will check labs including INR, CBC/platelets.  I removed the nasal clamp, had the patient blow her nose to remove all the clot.  No active bleeding at this time.  We will continue to monitor in the emergency department while awaiting lab results.  Remains hemostatic.  We will discharge with Afrin and nasal clamp if bleeding recurs.  Platelet count has returned to 18,000.  I discussed the patient with Dr. Talbert Cage of hematology.  She states as long as the patient is hemostatic no further treatment at this time.  I will send a note to Dr. Grayland Ormond to have the patient followed up this week in the office.  I discussed Afrin use and nasal clamping at home for any recurrent bleeding.  If she is unable to stop bleeding at home she is to return to the emergency department.  Also provided return precautions for any further bleeding such as blood or black in her stool.   ____________________________________________   FINAL CLINICAL IMPRESSION(S) / ED DIAGNOSES  Epistaxis Thrombocytopenia   Harvest Dark, MD 10/04/17 [id number]

## 2017-10-06 ENCOUNTER — Encounter: Payer: Medicare HMO | Admitting: Internal Medicine

## 2017-10-06 DIAGNOSIS — S8011XD Contusion of right lower leg, subsequent encounter: Secondary | ICD-10-CM | POA: Diagnosis not present

## 2017-10-06 DIAGNOSIS — S8011XA Contusion of right lower leg, initial encounter: Secondary | ICD-10-CM | POA: Diagnosis not present

## 2017-10-06 DIAGNOSIS — S8012XD Contusion of left lower leg, subsequent encounter: Secondary | ICD-10-CM | POA: Diagnosis not present

## 2017-10-06 DIAGNOSIS — S8012XA Contusion of left lower leg, initial encounter: Secondary | ICD-10-CM | POA: Diagnosis not present

## 2017-10-06 DIAGNOSIS — D693 Immune thrombocytopenic purpura: Secondary | ICD-10-CM | POA: Diagnosis not present

## 2017-10-06 DIAGNOSIS — Z885 Allergy status to narcotic agent status: Secondary | ICD-10-CM | POA: Diagnosis not present

## 2017-10-06 NOTE — Progress Notes (Signed)
[name], [name] ([id number]) Visit Report for 10/06/2017 HPI Details Patient Name: Ariana [name], Ariana [name]. Date of Service: 10/06/2017 2:45 PM Medical Record Number: [id number] Patient Account Number: [id number] Date of Birth/Sex: 01-26-1933 (84 y.o. Female) Treating RN: Carolyne Fiscal, Debi Primary Care Provider: Fulton Reek Other Clinician: Referring Provider: Fulton Reek Treating Provider/Extender: Tito Dine in Treatment: 3 History of Present Illness HPI Description: 09/15/17; this is an 83 year old woman with a history of type 2 diabetes, chronic ITP for which she follows with oncology. She had a splenectomy in 2007. 3 weeks ago well sitting in the bathroom she fell off the platelet and hit her legs on the bathtub. She had 2 large hematomas. The area on the left has ruptured into a wound where the area on the right medial lower leg is actually still intact. She was given Keflex of concern for coexistent cellulitis on 11/28 the area on the right is still intact and she is not doing anything specific for that. She has a history of small hematomas related to thrombocytopenia on her lower legs although these all seem to have primarily healed. She has a history of falls 09/22/17; this is a patient who had traumatic hematomas on both her legs. When we admitted to clinic last week the left one had already evacuated whereas the right one had not. She arrived in clinic today with drainage out of the right side. We used Hydrofera Blue to the left 09/29/17; this is a patient who had a traumatic hematoma on both her legs. She has chronic ITP and states she bruises very easily. The right leg hematoma evacuated last week. We have been using Hydrofera Blue to both wound areas on both anterior legs. Today the one on the left is slightly hyper-granulated if the wound stalls we may need to address this. Otherwise the wounds are smaller and look healthy today 10/06/17; traumatic hematomas  on both her legs left greater than right. She has chronic venous insufficiency also chronic ITP. We have been using Hydrofera Blue to both of these wound areas. She has new wounds on the right lateral leg caused by her dog Electronic Signature(s) Signed: 10/06/2017 4:35:05 PM By: Linton Ham MD Entered By: Linton Ham on 10/06/2017 15:25:26 Ariana [name] ([id number]) -------------------------------------------------------------------------------- Physical Exam Details Patient Name: Ariana [name]. Date of Service: 10/06/2017 2:45 PM Medical Record Number: [id number] Patient Account Number: [id number] Date of Birth/Sex: [birth year] (84 y.o. Female) Treating RN: Ahmed Prima Primary Care Provider: Fulton Reek Other Clinician: Referring Provider: Fulton Reek Treating Provider/Extender: Tito Dine in Treatment: 3 Constitutional Patient is hypertensive.. Pulse regular and within target range for patient.Marland Kitchen Respirations regular, non-labored and within target range.. Temperature is normal and within the target range for the patient.Marland Kitchen appears in no distress. Eyes Conjunctivae clear. No discharge. Respiratory Respiratory effort is easy and symmetric bilaterally. Rate is normal at rest and on room air.. Cardiovascular left pedal pulses palpable. There is no edema but chronic venous insufficiency. Lymphatic Nonpalpable popliteal or inguinal area. Psychiatric No evidence of depression, anxiety, or agitation. Calm, cooperative, and communicative. Appropriate interactions and affect.. Notes Wound exam; on the left anterior leg the hip or granulation that was concerning last week really hasn't turned out to be too much of an issue nice epithelialization here. No evidence of surrounding infection oThe area on the right anterior medial leg is also healthy good granulation. No need for debridement, cultures or antibiotics o2 small linear scratch marks on the  right lateral leg from  her dog this week Electronic Signature(s) Signed: 10/06/2017 4:35:05 PM By: Linton Ham MD Entered By: Linton Ham on 10/06/2017 15:27:45 Ariana [name] ([id number]) -------------------------------------------------------------------------------- Physician Orders Details Patient Name: Ariana [name] Date of Service: 10/06/2017 2:45 PM Medical Record Number: [id number] Patient Account Number: [id number] Date of Birth/Sex: June 20, 1933 (84 y.o. Female) Treating RN: Carolyne Fiscal, Debi Primary Care Provider: Fulton Reek Other Clinician: Referring Provider: Fulton Reek Treating Provider/Extender: Tito Dine in Treatment: 3 Verbal / Phone Orders: Yes Clinician: Pinkerton, Debi Read Back and Verified: Yes Diagnosis Coding Wound Cleansing Wound #1 Left,Lateral Lower Leg o Clean wound with Normal Saline. o Cleanse wound with mild soap and water o May Shower, gently pat wound dry prior to applying new dressing. Wound #2 Right,Medial Lower Leg o Clean wound with Normal Saline. o Cleanse wound with mild soap and water o May Shower, gently pat wound dry prior to applying new dressing. Wound #3 Right,Lateral Lower Leg o Clean wound with Normal Saline. o Cleanse wound with mild soap and water o May Shower, gently pat wound dry prior to applying new dressing. Anesthetic (add to Medication List) Wound #1 Left,Lateral Lower Leg o Topical Lidocaine 4% cream applied to wound bed prior to debridement (In Clinic Only). Wound #2 Right,Medial Lower Leg o Topical Lidocaine 4% cream applied to wound bed prior to debridement (In Clinic Only). Wound #3 Right,Lateral Lower Leg o Topical Lidocaine 4% cream applied to wound bed prior to debridement (In Clinic Only). Skin Barriers/Peri-Wound Care Wound #1 Left,Lateral Lower Leg o Skin Prep Wound #2 Right,Medial Lower Leg o Skin Prep Wound #3 Right,Lateral Lower Leg o  Skin Prep Primary Wound Dressing Wound #1 Left,Lateral Lower Leg o Hydrafera Blue o Other: - adaptic Wound #2 Right,Medial Lower Leg o Hydrafera Blue o Other: - adaptic EYMI, LIPUMA E. ([id number]) Wound #3 Right,Lateral Lower Leg o Hydrafera Blue o Other: - adaptic Secondary Dressing Wound #1 Left,Lateral Lower Leg o Boardered Foam Dressing Wound #2 Right,Medial Lower Leg o Boardered Foam Dressing Wound #3 Right,Lateral Lower Leg o Boardered Foam Dressing Dressing Change Frequency Wound #1 Left,Lateral Lower Leg o Change Dressing Monday, Wednesday, Friday - HHRN to change dressing to change dressing Mondays and Fridays and pt will come to [facility] on Wednesdays. Wound #2 Right,Medial Lower Leg o Change Dressing Monday, Wednesday, Friday - HHRN to change dressing to change dressing Mondays and Fridays and pt will come to [facility] on Wednesdays. Wound #3 Right,Lateral Lower Leg o Change Dressing Monday, Wednesday, Friday - HHRN to change dressing to change dressing Mondays and Fridays and pt will come to [facility] on Wednesdays. Follow-up Appointments o Return Appointment in 2 weeks. Edema Control Wound #1 Left,Lateral Lower Leg o Elevate legs to the level of the heart and pump ankles as often as possible Wound #2 Right,Medial Lower Leg o Elevate legs to the level of the heart and pump ankles as often as possible Wound #3 Right,Lateral Lower Leg o Elevate legs to the level of the heart and pump ankles as often as possible Additional Orders / Instructions Wound #1 Left,Lateral Lower Leg o Increase protein intake. Wound #2 Right,Medial Lower Leg o Increase protein intake. Wound #3 Right,Lateral Lower Leg o Increase protein intake. Home Health Wound #1 [place] Nurse may visit PRN to address patientos wound care needs. MAILYNN, EVERLY  ([id number]) o FACE TO FACE ENCOUNTER: MEDICARE and MEDICAID PATIENTS: I certify that this  patient is under my care and that I had a face-to-face encounter that meets the physician face-to-face encounter requirements with this patient on this date. The encounter with the patient was in whole or in part for the following MEDICAL CONDITION: (primary reason for [place]) MEDICAL NECESSITY: I certify, that based on my findings, NURSING services are a medically necessary home health service. HOME BOUND STATUS: I certify that my clinical findings support that this patient is homebound (i.e., Due to illness or injury, pt requires aid of supportive devices such as crutches, cane, wheelchairs, walkers, the use of special transportation or the assistance of another person to leave their place of residence. There is a normal inability to leave the home and doing so requires considerable and taxing effort. Other absences are for medical reasons / religious services and are infrequent or of short duration when for other reasons). o If current dressing causes regression in wound condition, may D/C ordered dressing product/s and apply Normal Saline Moist Dressing daily until next [place] / Other MD appointment. [place] of regression in wound condition at [phone]. o Please direct any NON-WOUND related issues/requests for orders to patient's Primary Care Physician Wound #2 [place] Visits o Home Health Nurse may visit PRN to address patientos wound care needs. o FACE TO FACE ENCOUNTER: MEDICARE and MEDICAID PATIENTS: I certify that this patient is under my care and that I had a face-to-face encounter that meets the physician face-to-face encounter requirements with this patient on this date. The encounter with the patient was in whole or in part for the following MEDICAL CONDITION: (primary reason for [place])  MEDICAL NECESSITY: I certify, that based on my findings, NURSING services are a medically necessary home health service. HOME BOUND STATUS: I certify that my clinical findings support that this patient is homebound (i.e., Due to illness or injury, pt requires aid of supportive devices such as crutches, cane, wheelchairs, walkers, the use of special transportation or the assistance of another person to leave their place of residence. There is a normal inability to leave the home and doing so requires considerable and taxing effort. Other absences are for medical reasons / religious services and are infrequent or of short duration when for other reasons). o If current dressing causes regression in wound condition, may D/C ordered dressing product/s and apply Normal Saline Moist Dressing daily until next [place] / Other MD appointment. [place] of regression in wound condition at [phone]. o Please direct any NON-WOUND related issues/requests for orders to patient's Primary Care Physician Wound #3 [place] Nurse may visit PRN to address patientos wound care needs. o FACE TO FACE ENCOUNTER: MEDICARE and MEDICAID PATIENTS: I certify that this patient is under my care and that I had a face-to-face encounter that meets the physician face-to-face encounter requirements with this patient on this date. The encounter with the patient was in whole or in part for the following MEDICAL CONDITION: (primary reason for [place]) MEDICAL NECESSITY: I certify, that based on my findings, NURSING services are a medically necessary home health service. HOME BOUND STATUS: I certify that my clinical findings support that this patient is homebound (i.e., Due to illness or injury, pt requires aid of supportive devices such as crutches, cane, wheelchairs, walkers, the use of special transportation or  the assistance of another person to leave their place of residence.  There is a normal inability to leave the home and doing so requires considerable and taxing effort. Other absences are for medical reasons / religious services and are infrequent or of short duration when for other reasons). o If current dressing causes regression in wound condition, may D/C ordered dressing product/s and apply Normal Saline Moist Dressing daily until next [place] / Other MD appointment. [place] of regression in wound condition at [phone]. o Please direct any NON-WOUND related issues/requests for orders to patient's Primary Care Physician Patient Medications Allergies: aspirin, diazepam, morphine, tetanus and diphtheria toxoids, adsorbed, adult Notifications Medication Indication Start End lidocaine DOSE 1 - topical 4 % cream - 1 cream topical ANNITTA, FIFIELD ([id number]) Electronic Signature(s) Signed: 10/06/2017 4:35:05 PM By: Linton Ham MD Signed: 10/06/2017 4:38:32 PM By: Alric Quan Entered By: Alric Quan on 10/06/2017 15:06:09 Ariana [name] ([id number]) -------------------------------------------------------------------------------- Prescription 10/06/2017 Patient Name: Ariana [name]. Provider: Ricard Dillon MD Date of Birth: June 01, 1933 NPI#: [id number] Sex: F DEA#: [id number] Phone #: [phone] License #: [id number] Patient Address: [place], [place] [address] [address] [place], [place] [address] [phone] Allergies aspirin diazepam morphine tetanus and diphtheria toxoids, adsorbed, adult Medication Medication: Route: Strength: Form: lidocaine topical 4% cream Class: TOPICAL LOCAL ANESTHETICS Dose: Frequency / Time: Indication: 1 1 cream topical Number of Refills: Number of  Units: 0 Generic Substitution: Start Date: End Date: Administered at [place]: No Note to Pharmacy: Signature(s): Date(s): ALAUNA, HAYDEN ([id number]) Electronic Signature(s) Signed: 10/06/2017 4:35:05 PM By: Linton Ham MD Signed: 10/06/2017 4:38:32 PM By: Alric Quan Entered By: Alric Quan on 10/06/2017 15:06:10 Ariana [name] ([id number]) --------------------------------------------------------------------------------  Problem List Details Patient Name: Ariana [name] Date of Service: 10/06/2017 2:45 PM Medical Record Number: [id number] Patient Account Number: [id number] Date of Birth/Sex: 1933-09-20 (84 y.o. 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 Date of Service: 10/06/2017 2:45 PM Medical Record Number: [id number] Patient Account Number: [id number] Date of Birth/Sex: 1933-08-22 (84 y.o. Female) Treating RN: Carolyne Fiscal, Debi Primary Care Provider: Fulton Reek Other Clinician: Referring Provider: Fulton Reek Treating Provider/Extender: Tito Dine in Treatment: 3 Subjective History of Present Illness (HPI) 09/15/17; this is an 83 year old  woman with a history of type 2 diabetes, chronic ITP for which she follows with oncology. She had a splenectomy in 2007. 3 weeks ago well sitting in the bathroom she fell off the platelet and hit her legs on the bathtub. She had 2 large hematomas. The area on the left has ruptured into a wound where the area on the right medial lower leg is actually still intact. She was given Keflex of concern for coexistent cellulitis on 11/28 the area on the right is still intact and she is not doing anything specific for that. She has a history of small hematomas related to thrombocytopenia on her lower legs although these all seem to have  primarily healed. She has a history of falls 09/22/17; this is a patient who had traumatic hematomas on both her legs. When we admitted to clinic last week the left one had already evacuated whereas the right one had not. She arrived in clinic today with drainage out of the right side. We used Hydrofera Blue to the left 09/29/17; this is a patient who had a traumatic hematoma on both her legs. She has chronic ITP and states she bruises very easily. The right leg hematoma evacuated last week. We have been using Hydrofera Blue to both wound areas on both anterior legs. Today the one on the left is slightly hyper-granulated if the wound stalls we may need to address this. Otherwise the wounds are smaller and look healthy today 10/06/17; traumatic hematomas on both her legs left greater than right. She has chronic venous insufficiency also chronic ITP. We have been using Hydrofera Blue to both of these wound areas. She has new wounds on the right lateral leg caused by her dog Objective Constitutional Patient is hypertensive.. Pulse regular and within target range for patient.Marland Kitchen Respirations regular, non-labored and within target range.. Temperature is normal and within the target range for the patient.Marland Kitchen appears in no distress. Vitals Time Taken: 3:50 PM, Height: 65 in, Weight:  133.4 lbs, BMI: 22.2, Temperature: 98.3 F, Pulse: 60 bpm, Respiratory Rate: 18 breaths/min, Blood Pressure: 119/91 mmHg. Eyes Conjunctivae clear. No discharge. Respiratory Respiratory effort is easy and symmetric bilaterally. Rate is normal at rest and on room air.. Cardiovascular left pedal pulses palpable. There is no edema but chronic venous insufficiency. Lymphatic Nonpalpable popliteal or inguinal area. SHIRLA, HODGKISS ([id number]) Psychiatric No evidence of depression, anxiety, or agitation. Calm, cooperative, and communicative. Appropriate interactions and affect.. General Notes: Wound exam; on the left anterior leg the hip or granulation that was concerning last week really hasn't turned out to be too much of an issue nice epithelialization here. No evidence of surrounding infection The area on the right anterior medial leg is also healthy good granulation. No need for debridement, cultures or antibiotics 2 small linear scratch marks on the right lateral leg from her dog this week Integumentary (Hair, Skin) Wound #1 status is Open. Original cause of wound was Trauma. The wound is located on the Left,Lateral Lower Leg. The wound measures 3cm length x 2.2cm width x 0.1cm depth; 5.184cm^2 area and 0.518cm^3 volume. There is no tunneling or undermining noted. There is a large amount of serosanguineous drainage noted. The wound margin is distinct with the outline attached to the wound base. There is large (67-100%) red granulation within the wound bed. There is a small (1-33%) amount of necrotic tissue within the wound bed including Adherent Slough. The periwound skin appearance exhibited: Excoriation, Maceration, Erythema. The surrounding wound skin color is noted with erythema which is circumferential. Periwound temperature was noted as No Abnormality. The periwound has tenderness on palpation. Wound #2 status is Open. Original cause of wound was Trauma. The wound is located on  the Right,Medial Lower Leg. The wound measures 2.5cm length x 2cm width x 0.1cm depth; 3.927cm^2 area and 0.393cm^3 volume. There is no tunneling or undermining noted. There is a large amount of serosanguineous drainage noted. The wound margin is distinct with the outline attached to the wound base. There is large (67-100%) red granulation within the wound bed. There is a small (1-33%) amount of necrotic tissue within the wound bed including Adherent Slough. The periwound skin appearance exhibited: Induration, Maceration, Erythema. The  surrounding wound skin color is noted with erythema which is circumferential. Periwound temperature was noted as No Abnormality. The periwound has tenderness on palpation. Wound #3 status is Open. Original cause of wound was Trauma. The wound is located on the Right,Lateral Lower Leg. The wound measures 3.2cm length x 2cm width x 0.1cm depth; 5.027cm^2 area and 0.503cm^3 volume. There is no tunneling or undermining noted. There is a large amount of serosanguineous drainage noted. The wound margin is distinct with the outline attached to the wound base. There is large (67-100%) red granulation within the wound bed. There is a small (1-33%) amount of necrotic tissue within the wound bed including Adherent Slough. Periwound temperature was noted as No Abnormality. The periwound has tenderness on palpation. Assessment Active Problems ICD-10 S80.12XD - Contusion of left lower leg, subsequent encounter S80.11XD - Contusion of right lower leg, subsequent encounter D69.3 - Immune thrombocytopenic purpura Plan Wound Cleansing: Wound #1 Left,Lateral Lower Leg: Clean wound with Normal Saline. Cleanse wound with mild soap and water May Shower, gently pat wound dry prior to applying new dressing. CHEILA, WICKSTROM ([id number]) Wound #2 Right,Medial Lower Leg: Clean wound with Normal Saline. Cleanse wound with mild soap and water May Shower, gently pat wound dry prior  to applying new dressing. Wound #3 Right,Lateral Lower Leg: Clean wound with Normal Saline. Cleanse wound with mild soap and water May Shower, gently pat wound dry prior to applying new dressing. Anesthetic (add to Medication List): Wound #1 Left,Lateral Lower Leg: Topical Lidocaine 4% cream applied to wound bed prior to debridement (In Clinic Only). Wound #2 Right,Medial Lower Leg: Topical Lidocaine 4% cream applied to wound bed prior to debridement (In Clinic Only). Wound #3 Right,Lateral Lower Leg: Topical Lidocaine 4% cream applied to wound bed prior to debridement (In Clinic Only). Skin Barriers/Peri-Wound Care: Wound #1 Left,Lateral Lower Leg: Skin Prep Wound #2 Right,Medial Lower Leg: Skin Prep Wound #3 Right,Lateral Lower Leg: Skin Prep Primary Wound Dressing: Wound #1 Left,Lateral Lower Leg: Hydrafera Blue Other: - adaptic Wound #2 Right,Medial Lower Leg: Hydrafera Blue Other: - adaptic Wound #3 Right,Lateral Lower Leg: Hydrafera Blue Other: - adaptic Secondary Dressing: Wound #1 Left,Lateral Lower Leg: Boardered Foam Dressing Wound #2 Right,Medial Lower Leg: Boardered Foam Dressing Wound #3 Right,Lateral Lower Leg: Boardered Foam Dressing Dressing Change Frequency: Wound #1 Left,Lateral Lower Leg: Change Dressing Monday, Wednesday, Friday - HHRN to change dressing to change dressing Mondays and Fridays and pt will come to [facility] on Wednesdays. Wound #2 Right,Medial Lower Leg: Change Dressing Monday, Wednesday, Friday - HHRN to change dressing to change dressing Mondays and Fridays and pt will come to [facility] on Wednesdays. Wound #3 Right,Lateral Lower Leg: Change Dressing Monday, Wednesday, Friday - HHRN to change dressing to change dressing Mondays and Fridays and pt will come to [facility] on Wednesdays. Follow-up Appointments: Return Appointment in 2 weeks. Edema Control: Wound #1 Left,Lateral Lower Leg: Elevate legs to the  level of the heart and pump ankles as often as possible Wound #2 Right,Medial Lower Leg: Elevate legs to the level of the heart and pump ankles as often as possible Wound #3 Right,Lateral Lower Leg: Elevate legs to the level of the heart and pump ankles as often as possible Additional Orders / Instructions: Wound #1 Left,Lateral Lower Leg: Frerichs, Tannia E. ([id number]) Increase protein intake. Wound #2 Right,Medial Lower Leg: Increase protein intake. Wound #3 Right,Lateral Lower Leg: Increase protein intake. Home Health: Wound #1 Left,Lateral Lower Leg: [place]  Visits Home Health Nurse may visit PRN to address patient s wound care needs. FACE TO FACE ENCOUNTER: MEDICARE and MEDICAID PATIENTS: I certify that this patient is under my care and that I had a face-to-face encounter that meets the physician face-to-face encounter requirements with this patient on this date. The encounter with the patient was in whole or in part for the following MEDICAL CONDITION: (primary reason for [place]) MEDICAL NECESSITY: I certify, that based on my findings, NURSING services are a medically necessary home health service. HOME BOUND STATUS: I certify that my clinical findings support that this patient is homebound (i.e., Due to illness or injury, pt requires aid of supportive devices such as crutches, cane, wheelchairs, walkers, the use of special transportation or the assistance of another person to leave their place of residence. There is a normal inability to leave the home and doing so requires considerable and taxing effort. Other absences are for medical reasons / religious services and are infrequent or of short duration when for other reasons). If current dressing causes regression in wound condition, may D/C ordered dressing product/s and apply Normal Saline Moist Dressing daily until next [place] / Other MD appointment. [place] of regression  in wound condition at [phone]. Please direct any NON-WOUND related issues/requests for orders to patient's Primary Care Physician Wound #2 Right,Medial Lower Leg: [place] Nurse may visit PRN to address patient s wound care needs. FACE TO FACE ENCOUNTER: MEDICARE and MEDICAID PATIENTS: I certify that this patient is under my care and that I had a face-to-face encounter that meets the physician face-to-face encounter requirements with this patient on this date. The encounter with the patient was in whole or in part for the following MEDICAL CONDITION: (primary reason for [place]) MEDICAL NECESSITY: I certify, that based on my findings, NURSING services are a medically necessary home health service. HOME BOUND STATUS: I certify that my clinical findings support that this patient is homebound (i.e., Due to illness or injury, pt requires aid of supportive devices such as crutches, cane, wheelchairs, walkers, the use of special transportation or the assistance of another person to leave their place of residence. There is a normal inability to leave the home and doing so requires considerable and taxing effort. Other absences are for medical reasons / religious services and are infrequent or of short duration when for other reasons). If current dressing causes regression in wound condition, may D/C ordered dressing product/s and apply Normal Saline Moist Dressing daily until next [place] / Other MD appointment. [place] of regression in wound condition at [phone]. Please direct any NON-WOUND related issues/requests for orders to patient's Primary Care Physician Wound #3 Right,Lateral Lower Leg: [place] Nurse may visit PRN to address patient s wound care needs. FACE TO FACE ENCOUNTER: MEDICARE and MEDICAID PATIENTS: I certify that this patient is under my care and that I had a face-to-face  encounter that meets the physician face-to-face encounter requirements with this patient on this date. The encounter with the patient was in whole or in part for the following MEDICAL CONDITION: (primary reason for [place]) MEDICAL NECESSITY: I certify, that based on my findings, NURSING services are a medically necessary home health service. HOME BOUND STATUS: I certify that my clinical findings support that this patient is homebound (i.e., Due to illness or injury, pt requires aid of supportive devices such as crutches, cane, wheelchairs,  walkers, the use of special transportation or the assistance of another person to leave their place of residence. There is a normal inability to leave the home and doing so requires considerable and taxing effort. Other absences are for medical reasons / religious services and are infrequent or of short duration when for other reasons). If current dressing causes regression in wound condition, may D/C ordered dressing product/s and apply Normal Saline Moist Dressing daily until next [place] / Other MD appointment. [place] of regression in wound condition at [phone]. Please direct any NON-WOUND related issues/requests for orders to patient's Primary Care Physician The following medication(s) was prescribed: lidocaine topical 4 % cream 1 1 cream topical Mavity, Harley E. ([id number]) 1 hydrofera blue to all wound areas 2 making good progress Electronic Signature(s) Signed: 10/06/2017 4:35:05 PM By: Linton Ham MD Entered By: Linton Ham on 10/06/2017 15:31:04 Ariana [name] ([id number]) -------------------------------------------------------------------------------- SuperBill Details Patient Name: Ariana [name]. Date of Service: 10/06/2017 Medical Record Number: [id number] Patient Account Number: [id number] Date of Birth/Sex: 1933/08/29 (84 y.o. 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

## 2017-10-07 ENCOUNTER — Other Ambulatory Visit: Payer: Medicare HMO

## 2017-10-07 ENCOUNTER — Inpatient Hospital Stay: Payer: Medicare HMO

## 2017-10-07 DIAGNOSIS — D693 Immune thrombocytopenic purpura: Secondary | ICD-10-CM | POA: Diagnosis not present

## 2017-10-07 DIAGNOSIS — E7800 Pure hypercholesterolemia, unspecified: Secondary | ICD-10-CM | POA: Diagnosis not present

## 2017-10-07 DIAGNOSIS — R5383 Other fatigue: Secondary | ICD-10-CM | POA: Diagnosis not present

## 2017-10-07 DIAGNOSIS — F0390 Unspecified dementia without behavioral disturbance: Secondary | ICD-10-CM | POA: Diagnosis not present

## 2017-10-07 DIAGNOSIS — R531 Weakness: Secondary | ICD-10-CM | POA: Diagnosis not present

## 2017-10-07 DIAGNOSIS — F329 Major depressive disorder, single episode, unspecified: Secondary | ICD-10-CM | POA: Diagnosis not present

## 2017-10-07 DIAGNOSIS — K219 Gastro-esophageal reflux disease without esophagitis: Secondary | ICD-10-CM | POA: Diagnosis not present

## 2017-10-07 DIAGNOSIS — R5381 Other malaise: Secondary | ICD-10-CM | POA: Diagnosis not present

## 2017-10-07 DIAGNOSIS — Z9081 Acquired absence of spleen: Secondary | ICD-10-CM | POA: Diagnosis not present

## 2017-10-07 LAB — CBC WITH DIFFERENTIAL/PLATELET
BASOS ABS: 0.1 10*3/uL (ref 0–0.1)
Basophils Relative: 1 %
Eosinophils Absolute: 0.1 10*3/uL (ref 0–0.7)
Eosinophils Relative: 1 %
HEMATOCRIT: 35.9 % (ref 35.0–47.0)
HEMOGLOBIN: 11.3 g/dL — AB (ref 12.0–16.0)
LYMPHS PCT: 26 %
Lymphs Abs: 2.9 10*3/uL (ref 1.0–3.6)
MCH: 27.5 pg (ref 26.0–34.0)
MCHC: 31.5 g/dL — ABNORMAL LOW (ref 32.0–36.0)
MCV: 87.2 fL (ref 80.0–100.0)
MONO ABS: 0.8 10*3/uL (ref 0.2–0.9)
MONOS PCT: 7 %
NEUTROS ABS: 7.3 10*3/uL — AB (ref 1.4–6.5)
NEUTROS PCT: 65 %
Platelets: 78 10*3/uL — ABNORMAL LOW (ref 150–400)
RBC: 4.12 MIL/uL (ref 3.80–5.20)
RDW: 18.3 % — AB (ref 11.5–14.5)
WBC: 11.1 10*3/uL — ABNORMAL HIGH (ref 3.6–11.0)

## 2017-10-08 DIAGNOSIS — M5136 Other intervertebral disc degeneration, lumbar region: Secondary | ICD-10-CM | POA: Diagnosis not present

## 2017-10-08 DIAGNOSIS — S8011XD Contusion of right lower leg, subsequent encounter: Secondary | ICD-10-CM | POA: Diagnosis not present

## 2017-10-08 DIAGNOSIS — D693 Immune thrombocytopenic purpura: Secondary | ICD-10-CM | POA: Diagnosis not present

## 2017-10-08 DIAGNOSIS — M5030 Other cervical disc degeneration, unspecified cervical region: Secondary | ICD-10-CM | POA: Diagnosis not present

## 2017-10-08 DIAGNOSIS — F0390 Unspecified dementia without behavioral disturbance: Secondary | ICD-10-CM | POA: Diagnosis not present

## 2017-10-08 DIAGNOSIS — I10 Essential (primary) hypertension: Secondary | ICD-10-CM | POA: Diagnosis not present

## 2017-10-08 DIAGNOSIS — I4891 Unspecified atrial fibrillation: Secondary | ICD-10-CM | POA: Diagnosis not present

## 2017-10-08 DIAGNOSIS — J45909 Unspecified asthma, uncomplicated: Secondary | ICD-10-CM | POA: Diagnosis not present

## 2017-10-08 DIAGNOSIS — S8012XD Contusion of left lower leg, subsequent encounter: Secondary | ICD-10-CM | POA: Diagnosis not present

## 2017-10-09 NOTE — Progress Notes (Signed)
Ariana [name], Ariana [name] ([id number]) Visit Report for 10/06/2017 Arrival Information Details Patient Name: [name], [name]. Date of Service: 10/06/2017 2:45 PM Medical Record Number: [id number] Patient Account Number: [id number] Date of Birth/Sex: May 14, 1933 (84 y.o. Female) Treating RN: Ariana [name] Primary Care Henery [name]: Ariana [name] Other Clinician: Referring Ariana [name]: Ariana [name] Treating Ariana [name]: Ariana [name] in Treatment: 3 Visit Information History Since Last Visit All ordered tests and consults were completed: No Patient Arrived: Ambulatory Added or deleted any medications: No Arrival Time: 14:49 Any new allergies or adverse reactions: No Accompanied By: self Had a fall or experienced change in No Transfer Assistance: None activities of daily living that may affect Patient Identification Verified: Yes risk of falls: Secondary Verification Process Completed: Yes Signs or symptoms of abuse/neglect since last visito No Patient Requires Transmission-Based No Hospitalized since last visit: No Precautions: Has Dressing in Place as Prescribed: Yes Patient Has Alerts: No Pain Present Now: No Electronic Signature(s) Signed: 10/06/2017 4:38:32 PM By: Ariana [name] Entered By: Ariana [name] on 10/06/2017 14:50:22 Ariana [name] ([id number]) -------------------------------------------------------------------------------- Clinic Level of Care Assessment Details Patient Name: Ariana [name] Date of Service: 10/06/2017 2:45 PM Medical Record Number: [id number] Patient Account Number: [id number] Date of Birth/Sex: [birth year] (84 y.o. Female) Treating RN: Ariana [name] Primary Care Makala Fetterolf: Ariana [name] Other Clinician: Referring Chevonne Bostrom: Ariana [name] Treating Corona Popovich/Extender: Ariana [name] in Treatment: 3 Clinic Level of Care Assessment Items TOOL 4 Quantity Score X - Use when only an EandM is performed on  FOLLOW-UP visit 1 0 ASSESSMENTS - Nursing Assessment / Reassessment X - Reassessment of Co-morbidities (includes updates in patient status) 1 10 X- 1 5 Reassessment of Adherence to Treatment Plan ASSESSMENTS - Wound and Skin Assessment / Reassessment []  - Simple Wound Assessment / Reassessment - one wound 0 X- 3 5 Complex Wound Assessment / Reassessment - multiple wounds []  - 0 Dermatologic / Skin Assessment (not related to wound area) ASSESSMENTS - Focused Assessment []  - Circumferential Edema Measurements - multi extremities 0 []  - 0 Nutritional Assessment / Counseling / Intervention []  - 0 Lower Extremity Assessment (monofilament, tuning fork, pulses) []  - 0 Peripheral Arterial Disease Assessment (using hand held doppler) ASSESSMENTS - Ostomy and/or Continence Assessment and Care []  - Incontinence Assessment and Management 0 []  - 0 Ostomy Care Assessment and Management (repouching, etc.) PROCESS - Coordination of Care []  - Simple Patient / Family Education for ongoing care 0 X- 1 20 Complex (extensive) Patient / Family Education for ongoing care X- 1 10 Staff obtains Programmer, systems, Records, Test Results / Process Orders X- 1 10 Staff telephones HHA, Nursing Homes / Clarify orders / etc []  - 0 Routine Transfer to another Facility (non-emergent condition) []  - 0 Routine Hospital Admission (non-emergent condition) []  - 0 New Admissions / Biomedical engineer / Ordering NPWT, Apligraf, etc. []  - 0 Emergency Hospital Admission (emergent condition) X- 1 10 Simple Discharge Coordination Ariana [name]. ([id number]) []  - 0 Complex (extensive) Discharge Coordination PROCESS - Special Needs []  - Pediatric / Minor Patient Management 0 []  - 0 Isolation Patient Management []  - 0 Hearing / Language / Visual special needs []  - 0 Assessment of Community assistance (transportation, D/C planning, etc.) []  - 0 Additional assistance / Altered mentation []  - 0 Support  Surface(s) Assessment (bed, cushion, seat, etc.) INTERVENTIONS - Wound Cleansing / Measurement []  - Simple Wound Cleansing - one wound 0 X- 3 5 Complex Wound Cleansing - multiple wounds X- 1 5 Wound Imaging (photographs - any number  of wounds) []  - 0 Wound Tracing (instead of photographs) []  - 0 Simple Wound Measurement - one wound X- 3 5 Complex Wound Measurement - multiple wounds INTERVENTIONS - Wound Dressings X - Small Wound Dressing one or multiple wounds 3 10 []  - 0 Medium Wound Dressing one or multiple wounds []  - 0 Large Wound Dressing one or multiple wounds X- 1 5 Application of Medications - topical []  - 0 Application of Medications - injection INTERVENTIONS - Miscellaneous []  - External ear exam 0 []  - 0 Specimen Collection (cultures, biopsies, blood, body fluids, etc.) []  - 0 Specimen(s) / Culture(s) sent or taken to Lab for analysis []  - 0 Patient Transfer (multiple staff / Civil Service fast streamer / Similar devices) []  - 0 Simple Staple / Suture removal (25 or less) []  - 0 Complex Staple / Suture removal (26 or more) []  - 0 Hypo / Hyperglycemic Management (close monitor of Blood Glucose) []  - 0 Ankle / Brachial Index (ABI) - do not check if billed separately X- 1 5 Vital Signs [name], [name] ([id number]) Has the patient been seen at the hospital within the last three years: Yes Total Score: 155 Level Of Care: New/Established - Level 4 Electronic Signature(s) Signed: 10/06/2017 4:38:32 PM By: Ariana [name] Entered By: Ariana [name] on 10/06/2017 16:24:33 Ariana [name] ([id number]) -------------------------------------------------------------------------------- Encounter Discharge Information Details Patient Name: Ariana [name]. Date of Service: 10/06/2017 2:45 PM Medical Record Number: [id number] Patient Account Number: [id number] Date of Birth/Sex: 06/20/1933 (84 y.o. Female) Treating RN: Ariana [name] Primary Care Sheryll Dymek: Ariana [name] Other Clinician: Referring [place]: Ariana [name] Treating Yasamin Karel/Extender: Ariana [name] in Treatment: 3 Encounter Discharge Information Items Discharge Pain Level: 0 Discharge Condition: Stable Ambulatory Status: Ambulatory Discharge Destination: Home Transportation: Private Auto Accompanied By: self Schedule Follow-up Appointment: Yes Medication Reconciliation completed and No provided to Patient/Care Zacchaeus Halm: Provided on Clinical Summary of Care: 10/06/2017 Form Type Recipient Paper Patient BI Electronic Signature(s) Signed: 10/08/2017 2:37:55 PM By: Ruthine Dose Entered By: Ruthine Dose on 10/06/2017 15:16:10 Ariana [name] ([id number]) -------------------------------------------------------------------------------- Lower Extremity Assessment Details Patient Name: Ariana [name]. Date of Service: 10/06/2017 2:45 PM Medical Record Number: [id number] Patient Account Number: [id number] Date of Birth/Sex: [birth year] (84 y.o. Female) Treating RN: Ariana [name] Primary Care Shady Bradish: Ariana [name] Other Clinician: Referring Anton Cheramie: Ariana [name] Treating Lailany Enoch/Extender: Ariana [name] in Treatment: 3 Vascular Assessment Pulses: Dorsalis Pedis Palpable: [Left:Yes] [Right:Yes] Posterior Tibial Extremity colors, hair growth, and conditions: Extremity Color: [Left:Mottled] [Right:Mottled] Temperature of Extremity: [Left:Warm] [Right:Warm] Capillary Refill: [Left:> 3 seconds] [Right:> 3 seconds] Toe Nail Assessment Left: Right: Thick: No No Discolored: No No Deformed: No No Improper Length and Hygiene: No No Electronic Signature(s) Signed: 10/06/2017 3:02:58 PM By: Ariana [name] Entered By: Ariana [name] on 10/06/2017 15:02:58 Ariana [name] ([id number]) -------------------------------------------------------------------------------- Multi Wound Chart Details Patient Name: Ariana [name]. Date of  Service: 10/06/2017 2:45 PM Medical Record Number: [id number] Patient Account Number: [id number] Date of Birth/Sex: 06-24-1933 (84 y.o. Female) Treating RN: Ariana [name] Primary Care Semaje Kinker: Ariana [name] Other Clinician: Referring Elisa Sorlie: Ariana [name] Treating Emree Locicero/Extender: Ariana [name] in Treatment: 3 Vital Signs Height(in): 65 Pulse(bpm): 60 Weight(lbs): 133.4 Blood Pressure(mmHg): 119/91 Body Mass Index(BMI): 22 Temperature(F): 98.3 Respiratory Rate 18 (breaths/min): Photos: [1:No Photos] [2:No Photos] [3:No Photos] Wound Location: [1:Left Lower Leg - Lateral] [2:Right Lower Leg - Medial] [3:Right Lower Leg - Lateral] Wounding Event: [1:Trauma] [2:Trauma] [3:Trauma] Primary Etiology: [1:Trauma, Other] [2:Trauma, Other] [3:Trauma, Other] Comorbid History: [1:Cataracts] [2:Cataracts] [3:Cataracts] Date  Acquired: [1:08/25/2017] [2:08/25/2017] [3:10/04/2017] Weeks of Treatment: [1:3] [2:3] [3:0] Wound Status: [1:Open] [2:Open] [3:Open] Clustered Wound: [1:No] [2:No] [3:Yes] Clustered Quantity: [1:N/A] [2:N/A] [3:2] Measurements L x W x D [1:3x2.2x0.1] [2:2.5x2x0.1] [3:3.2x2x0.1] (cm) Area (cm) : [1:5.184] [2:3.927] [3:5.027] Volume (cm) : [1:0.518] [2:0.393] [3:0.503] % Reduction in Area: [1:56.50%] [2:70.00%] [3:N/A] % Reduction in Volume: [1:56.60%] [2:70.00%] [3:N/A] Classification: [1:Partial Thickness] [2:Partial Thickness] [3:Partial Thickness] Exudate Amount: [1:Large] [2:Large] [3:Large] Exudate Type: [1:Serosanguineous] [2:Serosanguineous] [3:Serosanguineous] Exudate Color: [1:red, brown] [2:red, brown] [3:red, brown] Wound Margin: [1:Distinct, outline attached] [2:Distinct, outline attached] [3:Distinct, outline attached] Granulation Amount: [1:Large (67-100%)] [2:Large (67-100%)] [3:Large (67-100%)] Granulation Quality: [1:Red] [2:Red] [3:Red] Necrotic Amount: [1:Small (1-33%)] [2:Small (1-33%)] [3:Small (1-33%)] Exposed  Structures: [1:Fascia: No Fat Layer (Subcutaneous Tissue) Exposed: No Tendon: No Muscle: No Joint: No Bone: No] [2:Fascia: No Fat Layer (Subcutaneous Tissue) Exposed: No Tendon: No Muscle: No Joint: No Bone: No] [3:Fascia: No Fat Layer (Subcutaneous Tissue) Exposed:  No Tendon: No Muscle: No Joint: No Bone: No] Epithelialization: [1:None] [2:None] [3:None] Periwound Skin Texture: [1:Excoriation: Yes] [2:Induration: Yes] [3:No Abnormalities Noted] Periwound Skin Moisture: [1:Maceration: Yes] [2:Maceration: Yes] [3:No Abnormalities Noted] Periwound Skin Color: [1:Erythema: Yes] [2:Erythema: Yes] [3:No Abnormalities Noted] Erythema Location: [1:Circumferential] [2:Circumferential] [3:N/A] Temperature: [1:No Abnormality] [2:No Abnormality] [3:No Abnormality] Tenderness on Palpation: Yes Yes Yes Wound Preparation: Ulcer Cleansing: Ulcer Cleansing: Ulcer Cleansing: Rinsed/Irrigated with Saline Rinsed/Irrigated with Saline Rinsed/Irrigated with Saline Topical Anesthetic Applied: Topical Anesthetic Applied: Topical Anesthetic Applied: Other: lidocaine 4% Other: lidocaine 4% Other: lidocaine 4% Treatment Notes Electronic Signature(s) Signed: 10/06/2017 4:38:32 PM By: Ariana [name] Entered By: Ariana [name] on 10/06/2017 15:03:37 Ariana [name] ([id number]) -------------------------------------------------------------------------------- Multi-Disciplinary Care Plan Details Patient Name: Ariana [name]. Date of Service: 10/06/2017 2:45 PM Medical Record Number: [id number] Patient Account Number: [id number] Date of Birth/Sex: December 18, 1932 (84 y.o. Female) Treating RN: Ariana [name] Primary Care Ameir Faria: Ariana [name] Other Clinician: Referring Marnell Mcdaniel: Ariana [name] Treating Thadius Smisek/Extender: Ariana [name] in Treatment: 3 Active Inactive ` Abuse / Safety / Falls / Self Care Management Nursing Diagnoses: History of Falls Potential for  falls Goals: Patient will not experience any injury related to falls Date Initiated: 09/15/2017 Target Resolution Date: 01/15/2018 Goal Status: Active Interventions: Assess Activities of Daily Living upon admission and as needed Assess fall risk on admission and as needed Assess: immobility, friction, shearing, incontinence upon admission and as needed Assess impairment of mobility on admission and as needed per policy Assess personal safety and home safety (as indicated) on admission and as needed Assess self care needs on admission and as needed Notes: ` Nutrition Nursing Diagnoses: Imbalanced nutrition Potential for alteratiion in Nutrition/Potential for imbalanced nutrition Goals: Patient/caregiver agrees to and verbalizes understanding of need to use nutritional supplements and/or vitamins as prescribed Date Initiated: 09/15/2017 Target Resolution Date: 01/15/2018 Goal Status: Active Interventions: Assess patient nutrition upon admission and as needed per policy Notes: ` Orientation to the Wound Care Program Nursing Diagnoses: Ariana [name], Ariana [name] ([id number]) Knowledge deficit related to the wound healing center program Goals: Patient/caregiver will verbalize understanding of the [place] Program Date Initiated: 09/15/2017 Target Resolution Date: 01/15/2018 Goal Status: Active Interventions: Provide education on orientation to the wound center Notes: ` Pain, Acute or Chronic Nursing Diagnoses: Pain, acute or chronic: actual or potential Potential alteration in comfort, pain Goals: Patient/caregiver will verbalize adequate pain control between visits Date Initiated: 09/15/2017 Target Resolution Date: 01/15/2018 Goal Status: Active Interventions: Complete pain assessment as per visit requirements Notes: ` Wound/Skin Impairment Nursing Diagnoses: Impaired tissue integrity Knowledge  deficit related to ulceration/compromised skin integrity Goals: Ulcer/skin  breakdown will have a volume reduction of 80% by week 12 Date Initiated: 09/15/2017 Target Resolution Date: 01/15/2018 Goal Status: Active Interventions: Assess patient/caregiver ability to perform ulcer/skin care regimen upon admission and as needed Assess ulceration(s) every visit Notes: Electronic Signature(s) Signed: 10/06/2017 4:38:32 PM By: Ariana [name] Previous Signature: 10/06/2017 3:03:04 PM Version By: Ariana [name] Entered By: Ariana [name] on 10/06/2017 15:03:31 Ariana [name] ([id number]) -------------------------------------------------------------------------------- Pain Assessment Details Patient Name: Ariana [name]. Date of Service: 10/06/2017 2:45 PM Medical Record Number: [id number] Patient Account Number: [id number] Date of Birth/Sex: June 19, 1933 (84 y.o. Female) Treating RN: Ariana [name] Primary Care Ellary Casamento: Ariana [name] Other Clinician: Referring Nolon Yellin: Ariana [name] Treating Kwanza Cancelliere/Extender: Ariana [name] in Treatment: 3 Active Problems Location of Pain Severity and Description of Pain Patient Has Paino No Site Locations Pain Management and Medication Current Pain Management: Electronic Signature(s) Signed: 10/06/2017 4:38:32 PM By: Ariana [name] Entered By: Ariana [name] on 10/06/2017 14:50:27 Ariana [name] ([id number]) -------------------------------------------------------------------------------- Patient/Caregiver Education Details Patient Name: Ariana [name] Date of Service: 10/06/2017 2:45 PM Medical Record Number: [id number] Patient Account Number: [id number] Date of Birth/Gender: 1932-11-23 (83 y.o. Female) Treating RN: Ariana [name] Primary Care Physician: Ariana [name] Other Clinician: Referring Physician: Fulton [name] Treating Physician/Extender: Ariana [name] in Treatment: 3 Education Assessment Education Provided To: Patient Education Topics  Provided Wound/Skin Impairment: Handouts: Other: change dressing as ordered Methods: Demonstration, Explain/Verbal Responses: State content correctly Electronic Signature(s) Signed: 10/06/2017 4:38:32 PM By: Ariana [name] Entered By: Ariana [name] on 10/06/2017 15:04:28 Ariana [name] ([id number]) -------------------------------------------------------------------------------- Wound Assessment Details Patient Name: Ariana [name]. Date of Service: 10/06/2017 2:45 PM Medical Record Number: [id number] Patient Account Number: [id number] Date of Birth/Sex: [birth year] (84 y.o. Female) Treating RN: Ariana [name] Primary Care Tahjae Durr: Ariana [name] Other Clinician: Referring Lupe Bonner: Ariana [name] Treating Shaun Zuccaro/Extender: Ariana [name] in Treatment: 3 Wound Status Wound Number: 1 Primary Etiology: Trauma, Other Wound Location: Left Lower Leg - Lateral Wound Status: Open Wounding Event: Trauma Comorbid History: Cataracts Date Acquired: 08/25/2017 Weeks Of Treatment: 3 Clustered Wound: No Photos Photo Uploaded By: Ariana [name] on 10/06/2017 16:30:15 Wound Measurements Length: (cm) 3 Width: (cm) 2.2 Depth: (cm) 0.1 Area: (cm) 5.184 Volume: (cm) 0.518 % Reduction in Area: 56.5% % Reduction in Volume: 56.6% Epithelialization: None Tunneling: No Undermining: No Wound Description Classification: Partial Thickness Wound Margin: Distinct, outline attached Exudate Amount: Large Exudate Type: Serosanguineous Exudate Color: red, brown Foul Odor After Cleansing: No Slough/Fibrino Yes Wound Bed Granulation Amount: Large (67-100%) Exposed Structure Granulation Quality: Red Fascia Exposed: No Necrotic Amount: Small (1-33%) Fat Layer (Subcutaneous Tissue) Exposed: No Necrotic Quality: Adherent Slough Tendon Exposed: No Muscle Exposed: No Joint Exposed: No Bone Exposed: No Periwound Skin Texture [name], [name]  ([id number]) Texture Color No Abnormalities Noted: No No Abnormalities Noted: No Excoriation: Yes Erythema: Yes Erythema Location: Circumferential Moisture No Abnormalities Noted: No Temperature / Pain Maceration: Yes Temperature: No Abnormality Tenderness on Palpation: Yes Wound Preparation Ulcer Cleansing: Rinsed/Irrigated with Saline Topical Anesthetic Applied: Other: lidocaine 4%, Treatment Notes Wound #1 (Left, Lateral Lower Leg) 1. Cleansed with: Clean wound with Normal Saline 2. Anesthetic Topical Lidocaine 4% cream to wound bed prior to debridement 3. Peri-wound Care: Skin Prep 4. Dressing Applied: Hydrafera Blue 5. Secondary Dressing Applied Bordered Foam Dressing Notes adaptic Electronic Signature(s) Signed: 10/06/2017 4:38:32 PM By: Ariana [name] Entered By: Ariana [name] on 10/06/2017 15:01:51 Ariana [name] ([id number]) -------------------------------------------------------------------------------- Wound Assessment Details Patient Name:  Ariana [name], Ariana [name] Date of Service: 10/06/2017 2:45 PM Medical Record Number: [id number] Patient Account Number: [id number] Date of Birth/Sex: 1933-07-20 (84 y.o. Female) Treating RN: Ariana [name] Primary Care Fredricka Kohrs: Ariana [name] Other Clinician: Referring Tamotsu Wiederholt: Ariana [name] Treating Blase Beckner/Extender: Ariana [name] in Treatment: 3 Wound Status Wound Number: 2 Primary Etiology: Trauma, Other Wound Location: Right Lower Leg - Medial Wound Status: Open Wounding Event: Trauma Comorbid History: Cataracts Date Acquired: 08/25/2017 Weeks Of Treatment: 3 Clustered Wound: No Photos Photo Uploaded By: Ariana [name] on 10/06/2017 16:30:28 Wound Measurements Length: (cm) 2.5 Width: (cm) 2 Depth: (cm) 0.1 Area: (cm) 3.927 Volume: (cm) 0.393 % Reduction in Area: 70% % Reduction in Volume: 70% Epithelialization: None Tunneling: No Undermining: No Wound  Description Classification: Partial Thickness Wound Margin: Distinct, outline attached Exudate Amount: Large Exudate Type: Serosanguineous Exudate Color: red, brown Foul Odor After Cleansing: No Slough/Fibrino Yes Wound Bed Granulation Amount: Large (67-100%) Exposed Structure Granulation Quality: Red Fascia Exposed: No Necrotic Amount: Small (1-33%) Fat Layer (Subcutaneous Tissue) Exposed: No Necrotic Quality: Adherent Slough Tendon Exposed: No Muscle Exposed: No Joint Exposed: No Bone Exposed: No Periwound Skin Texture Ariana [name], [name] ([id number]) Texture Color No Abnormalities Noted: No No Abnormalities Noted: No Induration: Yes Erythema: Yes Erythema Location: Circumferential Moisture No Abnormalities Noted: No Temperature / Pain Maceration: Yes Temperature: No Abnormality Tenderness on Palpation: Yes Wound Preparation Ulcer Cleansing: Rinsed/Irrigated with Saline Topical Anesthetic Applied: Other: lidocaine 4%, Treatment Notes Wound #2 (Right, Medial Lower Leg) 1. Cleansed with: Clean wound with Normal Saline 2. Anesthetic Topical Lidocaine 4% cream to wound bed prior to debridement 3. Peri-wound Care: Skin Prep 4. Dressing Applied: Hydrafera Blue 5. Secondary Dressing Applied Bordered Foam Dressing Notes adaptic Electronic Signature(s) Signed: 10/06/2017 4:38:32 PM By: Ariana [name] Entered By: Ariana [name] on 10/06/2017 15:00:55 Ariana [name] ([id number]) -------------------------------------------------------------------------------- Wound Assessment Details Patient Name: Ariana [name] Date of Service: 10/06/2017 2:45 PM Medical Record Number: [id number] Patient Account Number: [id number] Date of Birth/Sex: 04/04/1933 (84 y.o. Female) Treating RN: Ariana [name] Primary Care Silver Achey: Ariana [name] Other Clinician: Referring Jorene Kaylor: Ariana [name] Treating Jettie Lazare/Extender: Ariana [name] in Treatment:  3 Wound Status Wound Number: 3 Primary Etiology: Trauma, Other Wound Location: Right Lower Leg - Lateral Wound Status: Open Wounding Event: Trauma Comorbid History: Cataracts Date Acquired: 10/04/2017 Weeks Of Treatment: 0 Clustered Wound: Yes Photos Photo Uploaded By: Ariana [name] on 10/06/2017 16:30:39 Wound Measurements Length: (cm) 3.2 % Re Width: (cm) 2 % Re Depth: (cm) 0.1 Epit Clustered Quantity: 2 Tunn Area: (cm) 5.027 Und Volume: (cm) 0.503 duction in Area: duction in Volume: helialization: None eling: No ermining: No Wound Description Classification: Partial Thickness Wound Margin: Distinct, outline attached Exudate Amount: Large Exudate Type: Serosanguineous Exudate Color: red, brown Foul Odor After Cleansing: No Slough/Fibrino Yes Wound Bed Granulation Amount: Large (67-100%) Exposed Structure Granulation Quality: Red Fascia Exposed: No Necrotic Amount: Small (1-33%) Fat Layer (Subcutaneous Tissue) Exposed: No Necrotic Quality: Adherent Slough Tendon Exposed: No Muscle Exposed: No Joint Exposed: No Bone Exposed: No Periwound Skin Texture Ariana [name], [name] ([id number]) Texture Color No Abnormalities Noted: No No Abnormalities Noted: No Moisture Temperature / Pain No Abnormalities Noted: No Temperature: No Abnormality Tenderness on Palpation: Yes Wound Preparation Ulcer Cleansing: Rinsed/Irrigated with Saline Topical Anesthetic Applied: Other: lidocaine 4%, Treatment Notes Wound #3 (Right, Lateral Lower Leg) 1. Cleansed with: Clean wound with Normal Saline 2. Anesthetic Topical Lidocaine 4% cream to wound bed prior to debridement 3. Peri-wound Care: Skin Prep 4. Dressing Applied:  Hydrafera Blue 5. Secondary Dressing Applied Bordered Foam Dressing Notes adaptic Electronic Signature(s) Signed: 10/06/2017 4:38:32 PM By: Ariana [name] Entered By: Ariana [name] on 10/06/2017 14:58:49 Ariana [name]  ([id number]) -------------------------------------------------------------------------------- Vitals Details Patient Name: Ariana [name] Date of Service: 10/06/2017 2:45 PM Medical Record Number: [id number] Patient Account Number: [id number] Date of Birth/Sex: 01/13/1933 (84 y.o. Female) Treating RN: Ariana [name] Primary Care Sheralyn Pinegar: Ariana [name] Other Clinician: Referring Drenda Sobecki: Ariana [name] Treating Clydie Dillen/Extender: Ariana [name] in Treatment: 3 Vital Signs Time Taken: 15:50 Temperature (F): 98.3 Height (in): 65 Pulse (bpm): 60 Weight (lbs): 133.4 Respiratory Rate (breaths/min): 18 Body Mass Index (BMI): 22.2 Blood Pressure (mmHg): 119/91 Reference Range: 80 - 120 mg / dl Electronic Signature(s) Signed: 10/06/2017 4:38:32 PM By: Ariana [name] Entered By: Ariana [name] on 10/06/2017 14:53:06

## 2017-10-11 DIAGNOSIS — S8011XD Contusion of right lower leg, subsequent encounter: Secondary | ICD-10-CM | POA: Diagnosis not present

## 2017-10-11 DIAGNOSIS — M5136 Other intervertebral disc degeneration, lumbar region: Secondary | ICD-10-CM | POA: Diagnosis not present

## 2017-10-11 DIAGNOSIS — M5030 Other cervical disc degeneration, unspecified cervical region: Secondary | ICD-10-CM | POA: Diagnosis not present

## 2017-10-11 DIAGNOSIS — J45909 Unspecified asthma, uncomplicated: Secondary | ICD-10-CM | POA: Diagnosis not present

## 2017-10-11 DIAGNOSIS — I4891 Unspecified atrial fibrillation: Secondary | ICD-10-CM | POA: Diagnosis not present

## 2017-10-11 DIAGNOSIS — F0390 Unspecified dementia without behavioral disturbance: Secondary | ICD-10-CM | POA: Diagnosis not present

## 2017-10-11 DIAGNOSIS — I10 Essential (primary) hypertension: Secondary | ICD-10-CM | POA: Diagnosis not present

## 2017-10-11 DIAGNOSIS — D693 Immune thrombocytopenic purpura: Secondary | ICD-10-CM | POA: Diagnosis not present

## 2017-10-11 DIAGNOSIS — S8012XD Contusion of left lower leg, subsequent encounter: Secondary | ICD-10-CM | POA: Diagnosis not present

## 2017-10-15 DIAGNOSIS — D693 Immune thrombocytopenic purpura: Secondary | ICD-10-CM | POA: Diagnosis not present

## 2017-10-15 DIAGNOSIS — I4891 Unspecified atrial fibrillation: Secondary | ICD-10-CM | POA: Diagnosis not present

## 2017-10-15 DIAGNOSIS — J45909 Unspecified asthma, uncomplicated: Secondary | ICD-10-CM | POA: Diagnosis not present

## 2017-10-15 DIAGNOSIS — F0390 Unspecified dementia without behavioral disturbance: Secondary | ICD-10-CM | POA: Diagnosis not present

## 2017-10-15 DIAGNOSIS — M5136 Other intervertebral disc degeneration, lumbar region: Secondary | ICD-10-CM | POA: Diagnosis not present

## 2017-10-15 DIAGNOSIS — S8012XD Contusion of left lower leg, subsequent encounter: Secondary | ICD-10-CM | POA: Diagnosis not present

## 2017-10-15 DIAGNOSIS — I10 Essential (primary) hypertension: Secondary | ICD-10-CM | POA: Diagnosis not present

## 2017-10-15 DIAGNOSIS — M5030 Other cervical disc degeneration, unspecified cervical region: Secondary | ICD-10-CM | POA: Diagnosis not present

## 2017-10-15 DIAGNOSIS — S8011XD Contusion of right lower leg, subsequent encounter: Secondary | ICD-10-CM | POA: Diagnosis not present

## 2017-10-18 DIAGNOSIS — I10 Essential (primary) hypertension: Secondary | ICD-10-CM | POA: Diagnosis not present

## 2017-10-18 DIAGNOSIS — S8012XD Contusion of left lower leg, subsequent encounter: Secondary | ICD-10-CM | POA: Diagnosis not present

## 2017-10-18 DIAGNOSIS — J45909 Unspecified asthma, uncomplicated: Secondary | ICD-10-CM | POA: Diagnosis not present

## 2017-10-18 DIAGNOSIS — S8011XD Contusion of right lower leg, subsequent encounter: Secondary | ICD-10-CM | POA: Diagnosis not present

## 2017-10-18 DIAGNOSIS — M5030 Other cervical disc degeneration, unspecified cervical region: Secondary | ICD-10-CM | POA: Diagnosis not present

## 2017-10-18 DIAGNOSIS — D693 Immune thrombocytopenic purpura: Secondary | ICD-10-CM | POA: Diagnosis not present

## 2017-10-18 DIAGNOSIS — M5136 Other intervertebral disc degeneration, lumbar region: Secondary | ICD-10-CM | POA: Diagnosis not present

## 2017-10-18 DIAGNOSIS — I4891 Unspecified atrial fibrillation: Secondary | ICD-10-CM | POA: Diagnosis not present

## 2017-10-18 DIAGNOSIS — F0390 Unspecified dementia without behavioral disturbance: Secondary | ICD-10-CM | POA: Diagnosis not present

## 2017-10-20 ENCOUNTER — Encounter: Payer: Medicare HMO | Attending: Internal Medicine | Admitting: Internal Medicine

## 2017-10-20 DIAGNOSIS — D693 Immune thrombocytopenic purpura: Secondary | ICD-10-CM | POA: Diagnosis not present

## 2017-10-20 DIAGNOSIS — S8012XD Contusion of left lower leg, subsequent encounter: Secondary | ICD-10-CM | POA: Insufficient documentation

## 2017-10-20 DIAGNOSIS — S8011XD Contusion of right lower leg, subsequent encounter: Secondary | ICD-10-CM | POA: Diagnosis not present

## 2017-10-20 DIAGNOSIS — L928 Other granulomatous disorders of the skin and subcutaneous tissue: Secondary | ICD-10-CM | POA: Diagnosis not present

## 2017-10-20 DIAGNOSIS — E119 Type 2 diabetes mellitus without complications: Secondary | ICD-10-CM | POA: Insufficient documentation

## 2017-10-20 DIAGNOSIS — W19XXXD Unspecified fall, subsequent encounter: Secondary | ICD-10-CM | POA: Insufficient documentation

## 2017-10-21 NOTE — Progress Notes (Signed)
NARA, PATERNOSTER ([id number]) Visit Report for 10/20/2017 HPI Details Patient Name: Ariana [name], Ariana [name]. Date of Service: 10/20/2017 9:45 AM Medical Record Number: [id number] Patient Account Number: [id number] Date of Birth/Sex: 1932-11-05 (84 y.o. Female) Treating RN: Carolyne Fiscal, Debi Primary Care Provider: Fulton Reek Other Clinician: Referring Provider: Fulton Reek Treating Provider/Extender: Tito Dine in Treatment: 5 History of Present Illness HPI Description: 09/15/17; this is an 84 year old woman with a history of type 2 diabetes, chronic ITP for which she follows with oncology. She had a splenectomy in 2007. 3 weeks ago well sitting in the bathroom she fell off the platelet and hit her legs on the bathtub. She had 2 large hematomas. The area on the left has ruptured into a wound where the area on the right medial lower leg is actually still intact. She was given Keflex of concern for coexistent cellulitis on 11/28 the area on the right is still intact and she is not doing anything specific for that. She has a history of small hematomas related to thrombocytopenia on her lower legs although these all seem to have primarily healed. She has a history of falls 09/22/17; this is a patient who had traumatic hematomas on both her legs. When we admitted to clinic last week the left one had already evacuated whereas the right one had not. She arrived in clinic today with drainage out of the right side. We used Hydrofera Blue to the left 09/29/17; this is a patient who had a traumatic hematoma on both her legs. She has chronic ITP and states she bruises very easily. The right leg hematoma evacuated last week. We have been using Hydrofera Blue to both wound areas on both anterior legs. Today the one on the left is slightly hyper-granulated if the wound stalls we may need to address this. Otherwise the wounds are smaller and look healthy today 10/06/17; traumatic hematomas on  both her legs left greater than right. She has chronic venous insufficiency also chronic ITP. We have been using Hydrofera Blue to both of these wound areas. She has new wounds on the right lateral leg caused by her dog 10/20/17; traumatic hematomas on both her legs left greater than right which is left her with chronic bilateral wounds. She has chronic venous insufficiency also chronic ITP. We've been using Hydrofera Blue to both of these wound areas largely because of hyperventilation. The right lateral leg wound was caused last visit by her dog. The left leg wound was caused by trauma against her bathtub. Electronic Signature(s) Signed: 10/20/2017 4:32:47 PM By: Linton Ham MD Entered By: Linton Ham on 10/20/2017 10:43:43 Ariana [name] ([id number]) -------------------------------------------------------------------------------- Otelia [facility] TISS Details Patient Name: Ariana [name] Date of Service: 10/20/2017 9:45 AM Medical Record Number: [id number] Patient Account Number: [id number] Date of Birth/Sex: 1933/06/16 (84 y.o. Female) Treating RN: Ahmed Prima Primary Care Provider: Fulton Reek Other Clinician: Referring Provider: Fulton Reek Treating Provider/Extender: Tito Dine in Treatment: 5 Procedure Performed for: Wound #1 Left,Lateral Lower Leg Performed By: Physician Ricard Dillon, MD Post Procedure Diagnosis Same as Pre-procedure Electronic Signature(s) Signed: 10/20/2017 4:32:47 PM By: Linton Ham MD Entered By: Linton Ham on 10/20/2017 10:42:44 Ariana [name] ([id number]) -------------------------------------------------------------------------------- Physical Exam Details Patient Name: Ariana [name] Date of Service: 10/20/2017 9:45 AM Medical Record Number: [id number] Patient Account Number: [id number] Date of Birth/Sex: 10-Jun-1933 (84 y.o. Female) Treating RN: Carolyne Fiscal, Debi Primary Care Provider:  Fulton Reek Other Clinician: Referring Provider: Fulton Reek Treating Provider/Extender: Linton Ham  G Weeks in Treatment: 5 Constitutional Patient is hypertensive.. Pulse regular and within target range for patient.Marland Kitchen Respirations regular, non-labored and within target range.. Temperature is normal and within the target range for the patient.Marland Kitchen appears in no distress. Notes Wound exam; on the left anterior leg she has hyper granulation. Using silver nitrate I knock this down to surface. There is been no change in the appearance or dimensions of this wound. oThe area on the right anterior medial leg appears healthy. This is come down slightly in terms of dimensions Electronic Signature(s) Signed: 10/20/2017 4:32:47 PM By: Linton Ham MD Entered By: Linton Ham on 10/20/2017 10:45:30 Ariana [name] ([id number]) -------------------------------------------------------------------------------- Physician Orders Details Patient Name: Ariana [name] Date of Service: 10/20/2017 9:45 AM Medical Record Number: [id number] Patient Account Number: [id number] Date of Birth/Sex: [birth year] (84 y.o. Female) Treating RN: Carolyne Fiscal, Debi Primary Care Provider: Fulton Reek Other Clinician: Referring Provider: Fulton Reek Treating Provider/Extender: Tito Dine in Treatment: 5 Verbal / Phone Orders: Yes Clinician: Carolyne Fiscal, Debi Read Back and Verified: Yes Diagnosis Coding Wound Cleansing Wound #1 Left,Lateral Lower Leg o Clean wound with Normal Saline. o Cleanse wound with mild soap and water o May Shower, gently pat wound dry prior to applying new dressing. Wound #2 Right,Medial Lower Leg o Clean wound with Normal Saline. o Cleanse wound with mild soap and water o May Shower, gently pat wound dry prior to applying new dressing. Anesthetic (add to Medication List) Wound #1 Left,Lateral Lower Leg o Topical Lidocaine 4% cream applied to  wound bed prior to debridement (In Clinic Only). Wound #2 Right,Medial Lower Leg o Topical Lidocaine 4% cream applied to wound bed prior to debridement (In Clinic Only). Skin Barriers/Peri-Wound Care Wound #1 Left,Lateral Lower Leg o Skin Prep Wound #2 Right,Medial Lower Leg o Skin Prep Primary Wound Dressing Wound #1 Left,Lateral Lower Leg o Hydrafera Blue o Other: - adaptic Wound #2 Right,Medial Lower Leg o Hydrafera Blue o Other: - adaptic Secondary Dressing Wound #1 Left,Lateral Lower Leg o Boardered Foam Dressing Wound #2 Right,Medial Lower Leg o Boardered Foam Dressing Dressing Change Frequency Wound #1 Left,Lateral Lower Leg TIYONA, DESOUZA ([id number]) o Change Dressing Monday, Wednesday, Friday - HHRN to change dressing to change dressing Mondays and Fridays and pt will come to [facility] on Wednesdays. Wound #2 Right,Medial Lower Leg o Change Dressing Monday, Wednesday, Friday - HHRN to change dressing to change dressing Mondays and Fridays and pt will come to [facility] on Wednesdays. Follow-up Appointments o Return Appointment in 1 week. Edema Control Wound #1 Left,Lateral Lower Leg o Elevate legs to the level of the heart and pump ankles as often as possible Wound #2 Right,Medial Lower Leg o Elevate legs to the level of the heart and pump ankles as often as possible Additional Orders / Instructions Wound #1 Left,Lateral Lower Leg o Increase protein intake. Wound #2 Right,Medial Lower Leg o Increase protein intake. Home Health Wound #1 [place] Nurse may visit PRN to address patientos wound care needs. o FACE TO FACE ENCOUNTER: MEDICARE and MEDICAID PATIENTS: I certify that this patient is under my care and that I had a face-to-face encounter that meets the physician face-to-face encounter requirements with this patient on this date. The encounter  with the patient was in whole or in part for the following MEDICAL CONDITION: (primary reason for [place]) MEDICAL NECESSITY: I certify, that based on my findings, NURSING services are  a medically necessary home health service. HOME BOUND STATUS: I certify that my clinical findings support that this patient is homebound (i.e., Due to illness or injury, pt requires aid of supportive devices such as crutches, cane, wheelchairs, walkers, the use of special transportation or the assistance of another person to leave their place of residence. There is a normal inability to leave the home and doing so requires considerable and taxing effort. Other absences are for medical reasons / religious services and are infrequent or of short duration when for other reasons). o If current dressing causes regression in wound condition, may D/C ordered dressing product/s and apply Normal Saline Moist Dressing daily until next [place] / Other MD appointment. [place] of regression in wound condition at [phone]. o Please direct any NON-WOUND related issues/requests for orders to patient's Primary Care Physician Wound #2 [place] Visits o Home Health Nurse may visit PRN to address patientos wound care needs. o FACE TO FACE ENCOUNTER: MEDICARE and MEDICAID PATIENTS: I certify that this patient is under my care and that I had a face-to-face encounter that meets the physician face-to-face encounter requirements with this patient on this date. The encounter with the patient was in whole or in part for the following MEDICAL CONDITION: (primary reason for [place]) MEDICAL NECESSITY: I certify, that based on my findings, NURSING services are a medically necessary home health service. HOME BOUND STATUS: I certify that my clinical findings support that this patient is homebound (i.e., Due to illness or injury, pt requires aid  of supportive devices such as crutches, cane, wheelchairs, walkers, the use of special transportation or the assistance of another person to leave their place of residence. There is a normal inability to leave the home and doing so requires considerable and taxing effort. Other absences are for medical reasons / religious services and are infrequent or of short duration when for other reasons). VANI, GUNNER ([id number]) o If current dressing causes regression in wound condition, may D/C ordered dressing product/s and apply Normal Saline Moist Dressing daily until next [place] / Other MD appointment. [place] of regression in wound condition at [phone]. o Please direct any NON-WOUND related issues/requests for orders to patient's Primary Care Physician Patient Medications Allergies: aspirin, diazepam, morphine, tetanus and diphtheria toxoids, adsorbed, adult Notifications Medication Indication Start End lidocaine DOSE 1 - topical 4 % cream - 1 cream topical Electronic Signature(s) Signed: 10/20/2017 4:32:47 PM By: Linton Ham MD Signed: 10/20/2017 4:47:31 PM By: Alric Quan Entered By: Alric Quan on 10/20/2017 10:24:42 Ariana [name] ([id number]) -------------------------------------------------------------------------------- Prescription 10/20/2017 Patient Name: Ariana [name]. Provider: Ricard Dillon MD Date of Birth: [birth year] NPI#: [id number] Sex: F DEA#: [id number] Phone #: [phone] License #: [id number] Patient Address: [place], [place] [address] [address], [place], [place] [address] [phone] Allergies aspirin diazepam morphine tetanus and diphtheria toxoids, adsorbed, adult Medication Medication: Route: Strength: Form: lidocaine topical 4% cream Class: TOPICAL LOCAL  ANESTHETICS Dose: Frequency / Time: Indication: 1 1 cream topical Number of Refills: Number of Units: 0 Generic Substitution: Start Date: End Date: Administered at [place]: Yes Time Administered: Time Discontinued: Note to Pharmacy: Signature(s): Date(s): KEIRY, KOWAL ([id number]) Electronic Signature(s) Signed: 10/20/2017 4:32:47 PM By: Linton Ham MD Signed: 10/20/2017 4:47:31 PM By: Alric Quan Entered By: Alric Quan on 10/20/2017 10:24:43  SINDHU, NGUYEN ([id number]) --------------------------------------------------------------------------------  Problem List Details Patient Name: AMULYA, QUINTIN. Date of Service: 10/20/2017 9:45 AM Medical Record Number: [id number] Patient Account Number: [id number] Date of Birth/Sex: [birth year] (84 y.o. 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 Date of Service: 10/20/2017 9:45 AM Medical Record Number: [id number] Patient Account Number: [id number] Date of Birth/Sex: December 05, 1932 (84 y.o. Female) Treating RN: Carolyne Fiscal, Debi Primary Care Provider: Fulton Reek Other Clinician: Referring Provider: Fulton Reek Treating  Provider/Extender: Tito Dine in Treatment: 5 Subjective History of Present Illness (HPI) 09/15/17; this is an 84 year old woman with a history of type 2 diabetes, chronic ITP for which she follows with oncology. She had a splenectomy in 2007. 3 weeks ago well sitting in the bathroom she fell off the platelet and hit her legs on the bathtub. She had 2 large hematomas. The area on the left has ruptured into a wound where the area on the right medial lower leg is actually still intact. She was given Keflex of concern for coexistent cellulitis on 11/28 the area on the right is still intact and she is not doing anything specific for that. She has a history of small hematomas related to thrombocytopenia on her lower legs although these all seem to have primarily healed. She has a history of falls 09/22/17; this is a patient who had traumatic hematomas on both her legs. When we admitted to clinic last week the left one had already evacuated whereas the right one had not. She arrived in clinic today with drainage out of the right side. We used Hydrofera Blue to the left 09/29/17; this is a patient who had a traumatic hematoma on both her legs. She has chronic ITP and states she bruises very easily. The right leg hematoma evacuated last week. We have been using Hydrofera Blue to both wound areas on both anterior legs. Today the one on the left is slightly hyper-granulated if the wound stalls we may need to address this. Otherwise the wounds are smaller and look healthy today 10/06/17; traumatic hematomas on both her legs left greater than right. She has chronic venous insufficiency also chronic ITP. We have been using Hydrofera Blue to both of these wound areas. She has new wounds on the right lateral leg caused by her dog 10/20/17; traumatic hematomas on both her legs left greater than right which is left her with chronic bilateral wounds. She has chronic venous insufficiency also chronic  ITP. We've been using Hydrofera Blue to both of these wound areas largely because of hyperventilation. The right lateral leg wound was caused last visit by her dog. The left leg wound was caused by trauma against her bathtub. Objective Constitutional Patient is hypertensive.. Pulse regular and within target range for patient.Marland Kitchen Respirations regular, non-labored and within target range.. Temperature is normal and within the target range for the patient.Marland Kitchen appears in no distress. Vitals Time Taken: 9:59 AM, Height: 65 in, Weight: 133.4 lbs, BMI: 22.2, Temperature: 98.3 F, Pulse: 58 bpm, Respiratory  Rate: 18 breaths/min, Blood Pressure: 147/56 mmHg. General Notes: Wound exam; on the left anterior leg she has hyper granulation. Using silver nitrate I knock this down to surface. There is been no change in the appearance or dimensions of this wound. The area on the right anterior medial leg appears healthy. This is come down slightly in terms of dimensions Integumentary (Hair, Skin) Wound #1 status is Open. Original cause of wound was Trauma. The wound is located on the Left,Lateral Lower Leg. The SIMRANJIT, THAYER E. ([id number]) wound measures 3.8cm length x 2.3cm width x 0.1cm depth; 6.864cm^2 area and 0.686cm^3 volume. There is no tunneling or undermining noted. There is a large amount of serosanguineous drainage noted. The wound margin is distinct with the outline attached to the wound base. There is large (67-100%) red granulation within the wound bed. There is a small (1-33%) amount of necrotic tissue within the wound bed including Adherent Slough. The periwound skin appearance exhibited: Excoriation, Erythema. The periwound skin appearance did not exhibit: Maceration. The surrounding wound skin color is noted with erythema which is circumferential. Periwound temperature was noted as No Abnormality. The periwound has tenderness on palpation. Wound #2 status is Open. Original cause of wound was  Trauma. The wound is located on the Right,Medial Lower Leg. The wound measures 1.8cm length x 0.9cm width x 0.1cm depth; 1.272cm^2 area and 0.127cm^3 volume. There is no tunneling or undermining noted. There is a large amount of serosanguineous drainage noted. The wound margin is distinct with the outline attached to the wound base. There is large (67-100%) red granulation within the wound bed. There is a small (1-33%) amount of necrotic tissue within the wound bed including Adherent Slough. The periwound skin appearance exhibited: Induration, Maceration, Erythema. The surrounding wound skin color is noted with erythema which is circumferential. Periwound temperature was noted as No Abnormality. The periwound has tenderness on palpation. Wound #3 status is Healed - Epithelialized. Original cause of wound was Trauma. The wound is located on the Right,Lateral Lower Leg. The wound measures 0cm length x 0cm width x 0cm depth; 0cm^2 area and 0cm^3 volume. There is no tunneling or undermining noted. There is a none present amount of drainage noted. The wound margin is distinct with the outline attached to the wound base. There is no granulation within the wound bed. There is no necrotic tissue within the wound bed. Periwound temperature was noted as No Abnormality. The periwound has tenderness on palpation. Assessment Active Problems ICD-10 S80.12XD - Contusion of left lower leg, subsequent encounter S80.11XD - Contusion of right lower leg, subsequent encounter D69.3 - Immune thrombocytopenic purpura Procedures Wound #1 Pre-procedure diagnosis of Wound #1 is a Trauma, Other located on the Left,Lateral Lower Leg . An CHEM CAUT GRANULATION TISS procedure was performed by Ricard Dillon, MD. Post procedure Diagnosis Wound #1: Same as Pre-Procedure Plan Wound Cleansing: Wound #1 Left,Lateral Lower Leg: Clean wound with Normal Saline. Cleanse wound with mild soap and water May Shower, gently pat  wound dry prior to applying new dressing. QUISHA, MABIE ([id number]) Wound #2 Right,Medial Lower Leg: Clean wound with Normal Saline. Cleanse wound with mild soap and water May Shower, gently pat wound dry prior to applying new dressing. Anesthetic (add to Medication List): Wound #1 Left,Lateral Lower Leg: Topical Lidocaine 4% cream applied to wound bed prior to debridement (In Clinic Only). Wound #2 Right,Medial Lower Leg: Topical Lidocaine 4% cream applied to wound bed prior to debridement (In Clinic Only). Skin Barriers/Peri-Wound Care: Wound #1  Left,Lateral Lower Leg: Skin Prep Wound #2 Right,Medial Lower Leg: Skin Prep Primary Wound Dressing: Wound #1 Left,Lateral Lower Leg: Hydrafera Blue Other: - adaptic Wound #2 Right,Medial Lower Leg: Hydrafera Blue Other: - adaptic Secondary Dressing: Wound #1 Left,Lateral Lower Leg: Boardered Foam Dressing Wound #2 Right,Medial Lower Leg: Boardered Foam Dressing Dressing Change Frequency: Wound #1 Left,Lateral Lower Leg: Change Dressing Monday, Wednesday, Friday - HHRN to change dressing to change dressing Mondays and Fridays and pt will come to [facility] on Wednesdays. Wound #2 Right,Medial Lower Leg: Change Dressing Monday, Wednesday, Friday - HHRN to change dressing to change dressing Mondays and Fridays and pt will come to [facility] on Wednesdays. Follow-up Appointments: Return Appointment in 1 week. Edema Control: Wound #1 Left,Lateral Lower Leg: Elevate legs to the level of the heart and pump ankles as often as possible Wound #2 Right,Medial Lower Leg: Elevate legs to the level of the heart and pump ankles as often as possible Additional Orders / Instructions: Wound #1 Left,Lateral Lower Leg: Increase protein intake. Wound #2 Right,Medial Lower Leg: Increase protein intake. Home Health: Wound #1 Left,Lateral Lower Leg: [place] Nurse may visit PRN to address  patient s wound care needs. FACE TO FACE ENCOUNTER: MEDICARE and MEDICAID PATIENTS: I certify that this patient is under my care and that I had a face-to-face encounter that meets the physician face-to-face encounter requirements with this patient on this date. The encounter with the patient was in whole or in part for the following MEDICAL CONDITION: (primary reason for [place]) MEDICAL NECESSITY: I certify, that based on my findings, NURSING services are a medically necessary home health service. HOME BOUND STATUS: I certify that my clinical findings support that this patient is homebound (i.e., Due to illness or injury, pt requires aid of supportive devices such as crutches, cane, wheelchairs, walkers, the use of special transportation or the assistance of another person to leave their place of residence. There is a normal inability to leave the home and doing so requires considerable and taxing effort. Other absences are for medical reasons / religious services and are infrequent or of short duration when for other reasons). If current dressing causes regression in wound condition, may D/C ordered dressing product/s and apply Normal Saline Moist Dressing daily until next [place] / Other MD appointment. [place] of regression in [place], [place] ([id number]) wound condition at [phone]. Please direct any NON-WOUND related issues/requests for orders to patient's Primary Care Physician Wound #2 Right,Medial Lower Leg: [place] Nurse may visit PRN to address patient s wound care needs. FACE TO FACE ENCOUNTER: MEDICARE and MEDICAID PATIENTS: I certify that this patient is under my care and that I had a face-to-face encounter that meets the physician face-to-face encounter requirements with this patient on this date. The encounter with the patient was in whole or in part for the following MEDICAL CONDITION: (primary  reason for [place]) MEDICAL NECESSITY: I certify, that based on my findings, NURSING services are a medically necessary home health service. HOME BOUND STATUS: I certify that my clinical findings support that this patient is homebound (i.e., Due to illness or injury, pt requires aid of supportive devices such as crutches, cane, wheelchairs, walkers, the use of special transportation or the assistance of another person to leave their place of residence. There is a normal inability to leave the home and doing so requires considerable and taxing effort. Other absences  are for medical reasons / religious services and are infrequent or of short duration when for other reasons). If current dressing causes regression in wound condition, may D/C ordered dressing product/s and apply Normal Saline Moist Dressing daily until next [place] / Other MD appointment. [place] of regression in wound condition at [phone]. Please direct any NON-WOUND related issues/requests for orders to patient's Primary Care Physician The following medication(s) was prescribed: lidocaine topical 4 % cream 1 1 cream topical was prescribed at facility 1 continue with hydrofera blue largely due to significant hypergrannulation Electronic Signature(s) Signed: 10/20/2017 4:32:47 PM By: Linton Ham MD Entered By: Linton Ham on 10/20/2017 10:47:05 Ariana [name] ([id number]) -------------------------------------------------------------------------------- SuperBill Details Patient Name: Ariana [name]. Date of Service: 10/20/2017 Medical Record Number: [id number] Patient Account Number: [id number] Date of Birth/Sex: June 04, 1933 (84 y.o. 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

## 2017-10-22 DIAGNOSIS — J45909 Unspecified asthma, uncomplicated: Secondary | ICD-10-CM | POA: Diagnosis not present

## 2017-10-22 DIAGNOSIS — M5030 Other cervical disc degeneration, unspecified cervical region: Secondary | ICD-10-CM | POA: Diagnosis not present

## 2017-10-22 DIAGNOSIS — I10 Essential (primary) hypertension: Secondary | ICD-10-CM | POA: Diagnosis not present

## 2017-10-22 DIAGNOSIS — I4891 Unspecified atrial fibrillation: Secondary | ICD-10-CM | POA: Diagnosis not present

## 2017-10-22 DIAGNOSIS — D693 Immune thrombocytopenic purpura: Secondary | ICD-10-CM | POA: Diagnosis not present

## 2017-10-22 DIAGNOSIS — S8012XD Contusion of left lower leg, subsequent encounter: Secondary | ICD-10-CM | POA: Diagnosis not present

## 2017-10-22 DIAGNOSIS — F0390 Unspecified dementia without behavioral disturbance: Secondary | ICD-10-CM | POA: Diagnosis not present

## 2017-10-22 DIAGNOSIS — M5136 Other intervertebral disc degeneration, lumbar region: Secondary | ICD-10-CM | POA: Diagnosis not present

## 2017-10-22 DIAGNOSIS — S8011XD Contusion of right lower leg, subsequent encounter: Secondary | ICD-10-CM | POA: Diagnosis not present

## 2017-10-23 NOTE — Progress Notes (Signed)
BEAULAH, ROMANEK ([id number]) Visit Report for 10/20/2017 Arrival Information Details Patient Name: Ariana [name], Ariana [name]. Date of Service: 10/20/2017 9:45 AM Medical Record Number: [id number] Patient Account Number: [id number] Date of Birth/Sex: 11-08-1932 (84 y.o. Female) Treating RN: Ahmed Prima Primary Care Deserie Dirks: Fulton Reek Other Clinician: Referring Aneita Kiger: Fulton Reek Treating Ezechiel Stooksbury/Extender: Tito Dine in Treatment: 5 Visit Information History Since Last Visit All ordered tests and consults were completed: No Patient Arrived: Ambulatory Added or deleted any medications: No Arrival Time: 09:58 Any new allergies or adverse reactions: No Accompanied By: son Had a fall or experienced change in No Transfer Assistance: None activities of daily living that may affect Patient Identification Verified: Yes risk of falls: Secondary Verification Process Completed: Yes Signs or symptoms of abuse/neglect since last visito No Patient Requires Transmission-Based No Hospitalized since last visit: No Precautions: Has Dressing in Place as Prescribed: Yes Patient Has Alerts: No Pain Present Now: No Electronic Signature(s) Signed: 10/20/2017 4:47:31 PM By: Alric Quan Entered By: Alric Quan on 10/20/2017 09:59:24 Ariana [name] ([id number]) -------------------------------------------------------------------------------- Encounter Discharge Information Details Patient Name: Ariana [name]. Date of Service: 10/20/2017 9:45 AM Medical Record Number: [id number] Patient Account Number: [id number] Date of Birth/Sex: [birth year] (84 y.o. Female) Treating RN: Carolyne Fiscal, Debi Primary Care Rondia Higginbotham: Fulton Reek Other Clinician: Referring Omaya Nieland: Fulton Reek Treating Vernice Mannina/Extender: Tito Dine in Treatment: 5 Encounter Discharge Information Items Discharge Pain Level: 0 Discharge Condition: Stable Ambulatory Status:  Ambulatory Discharge Destination: Home Transportation: Private Auto Accompanied By: son Schedule Follow-up Appointment: Yes Medication Reconciliation completed and No provided to Patient/Care Geoffery Aultman: Provided on Clinical Summary of Care: 10/20/2017 Form Type Recipient Paper Patient BI Electronic Signature(s) Signed: 10/22/2017 10:03:49 AM By: Ruthine Dose Previous Signature: 10/20/2017 10:15:21 AM Version By: Alric Quan Entered By: Ruthine Dose on 10/20/2017 10:34:27 Ariana [name] ([id number]) -------------------------------------------------------------------------------- Lower Extremity Assessment Details Patient Name: Ariana [name]. Date of Service: 10/20/2017 9:45 AM Medical Record Number: [id number] Patient Account Number: [id number] Date of Birth/Sex: [birth year] (84 y.o. Female) Treating RN: Carolyne Fiscal, Debi Primary Care Deshan Hemmelgarn: Fulton Reek Other Clinician: Referring Zaidy Absher: Fulton Reek Treating Elyshia Kumagai/Extender: Tito Dine in Treatment: 5 Vascular Assessment Pulses: Dorsalis Pedis Palpable: [Left:Yes] [Right:Yes] Posterior Tibial Extremity colors, hair growth, and conditions: Extremity Color: [Left:Mottled] [Right:Mottled] Temperature of Extremity: [Left:Warm] [Right:Warm] Capillary Refill: [Left:> 3 seconds] [Right:> 3 seconds] Toe Nail Assessment Left: Right: Thick: No No Discolored: No No Deformed: No No Improper Length and Hygiene: No No Electronic Signature(s) Signed: 10/20/2017 4:47:31 PM By: Alric Quan Entered By: Alric Quan on 10/20/2017 10:08:59 Ariana [name] ([id number]) -------------------------------------------------------------------------------- Multi Wound Chart Details Patient Name: Ariana [name]. Date of Service: 10/20/2017 9:45 AM Medical Record Number: [id number] Patient Account Number: [id number] Date of Birth/Sex: [birth year] (84 y.o. Female) Treating RN: Carolyne Fiscal, Debi Primary  Care Gisele Pack: Fulton Reek Other Clinician: Referring Jaidan Stachnik: Fulton Reek Treating Aeliana Spates/Extender: Tito Dine in Treatment: 5 Vital Signs Height(in): 65 Pulse(bpm): 93 Weight(lbs): 133.4 Blood Pressure(mmHg): 147/56 Body Mass Index(BMI): 22 Temperature(F): 98.3 Respiratory Rate 18 (breaths/min): Photos: [1:No Photos] [2:No Photos] [3:No Photos] Wound Location: [1:Left Lower Leg - Lateral] [2:Right Lower Leg - Medial] [3:Right Lower Leg - Lateral] Wounding Event: [1:Trauma] [2:Trauma] [3:Trauma] Primary Etiology: [1:Trauma, Other] [2:Trauma, Other] [3:Trauma, Other] Comorbid History: [1:Cataracts] [2:Cataracts] [3:Cataracts] Date Acquired: [1:08/25/2017] [2:08/25/2017] [3:10/04/2017] Weeks of Treatment: [1:5] [2:5] [3:2] Wound Status: [1:Open] [2:Open] [3:Healed - Epithelialized] Clustered Wound: [1:No] [2:No] [3:Yes] Clustered Quantity: [1:N/A] [2:N/A] [3:2] Measurements L x W x D [1:3.8x2.3x0.1] [2:1.8x0.9x0.1] [3:0x0x0] (cm)  Area (cm) : [1:6.864] [2:1.272] [3:0] Volume (cm) : [1:0.686] [2:0.127] [3:0] % Reduction in Area: [1:42.50%] [2:90.30%] [3:100.00%] % Reduction in Volume: [1:42.50%] [2:90.30%] [3:100.00%] Classification: [1:Partial Thickness] [2:Partial Thickness] [3:Partial Thickness] Exudate Amount: [1:Large] [2:Large] [3:None Present] Exudate Type: [1:Serosanguineous] [2:Serosanguineous] [3:N/A] Exudate Color: [1:red, brown] [2:red, brown] [3:N/A] Wound Margin: [1:Distinct, outline attached] [2:Distinct, outline attached] [3:Distinct, outline attached] Granulation Amount: [1:Large (67-100%)] [2:Large (67-100%)] [3:None Present (0%)] Granulation Quality: [1:Red] [2:Red] [3:N/A] Necrotic Amount: [1:Small (1-33%)] [2:Small (1-33%)] [3:None Present (0%)] Exposed Structures: [1:Fascia: No Fat Layer (Subcutaneous Tissue) Exposed: No Tendon: No Muscle: No Joint: No Bone: No] [2:Fascia: No Fat Layer (Subcutaneous Tissue) Exposed: No Tendon: No  Muscle: No Joint: No Bone: No] [3:Fascia: No Fat Layer (Subcutaneous Tissue) Exposed:  No Tendon: No Muscle: No Joint: No Bone: No] Epithelialization: [1:None] [2:None] [3:Large (67-100%)] Periwound Skin Texture: [1:Excoriation: Yes] [2:Induration: Yes] [3:No Abnormalities Noted] Periwound Skin Moisture: [1:Maceration: No] [2:Maceration: Yes] [3:No Abnormalities Noted] Periwound Skin Color: [1:Erythema: Yes] [2:Erythema: Yes] [3:No Abnormalities Noted] Erythema Location: [1:Circumferential] [2:Circumferential] [3:N/A] Temperature: [1:No Abnormality] [2:No Abnormality] [3:No Abnormality] Tenderness on Palpation: Yes Yes Yes Wound Preparation: Ulcer Cleansing: Ulcer Cleansing: Ulcer Cleansing: Rinsed/Irrigated with Saline Rinsed/Irrigated with Saline Rinsed/Irrigated with Saline Topical Anesthetic Applied: Topical Anesthetic Applied: Topical Anesthetic Applied: Other: lidocaine 4% Other: lidocaine 4% None Procedures Performed: CHEM CAUT GRANULATION N/A N/A TISS Treatment Notes Electronic Signature(s) Signed: 10/20/2017 4:32:47 PM By: Linton Ham MD Entered By: Linton Ham on 10/20/2017 10:42:30 Ariana [name] ([id number]) -------------------------------------------------------------------------------- Multi-Disciplinary Care Plan Details Patient Name: Ariana [name]. Date of Service: 10/20/2017 9:45 AM Medical Record Number: [id number] Patient Account Number: [id number] Date of Birth/Sex: July 22, 1933 (84 y.o. Female) Treating RN: Carolyne Fiscal, Debi Primary Care Cledis Sohn: Fulton Reek Other Clinician: Referring Jamisha Hoeschen: Fulton Reek Treating Robertt Buda/Extender: Tito Dine in Treatment: 5 Active Inactive ` Abuse / Safety / Falls / Self Care Management Nursing Diagnoses: History of Falls Potential for falls Goals: Patient will not experience any injury related to falls Date Initiated: 09/15/2017 Target Resolution Date: 01/15/2018 Goal Status:  Active Interventions: Assess Activities of Daily Living upon admission and as needed Assess fall risk on admission and as needed Assess: immobility, friction, shearing, incontinence upon admission and as needed Assess impairment of mobility on admission and as needed per policy Assess personal safety and home safety (as indicated) on admission and as needed Assess self care needs on admission and as needed Notes: ` Nutrition Nursing Diagnoses: Imbalanced nutrition Potential for alteratiion in Nutrition/Potential for imbalanced nutrition Goals: Patient/caregiver agrees to and verbalizes understanding of need to use nutritional supplements and/or vitamins as prescribed Date Initiated: 09/15/2017 Target Resolution Date: 01/15/2018 Goal Status: Active Interventions: Assess patient nutrition upon admission and as needed per policy Notes: ` Orientation to the Wound Care Program Nursing Diagnoses: JIN, SHOCKLEY ([id number]) Knowledge deficit related to the wound healing center program Goals: Patient/caregiver will verbalize understanding of the [facility] Date Initiated: 09/15/2017 Target Resolution Date: 01/15/2018 Goal Status: Active Interventions: Provide education on orientation to the wound center Notes: ` Pain, Acute or Chronic Nursing Diagnoses: Pain, acute or chronic: actual or potential Potential alteration in comfort, pain Goals: Patient/caregiver will verbalize adequate pain control between visits Date Initiated: 09/15/2017 Target Resolution Date: 01/15/2018 Goal Status: Active Interventions: Complete pain assessment as per visit requirements Notes: ` Wound/Skin Impairment Nursing Diagnoses: Impaired tissue integrity Knowledge deficit related to ulceration/compromised skin integrity Goals: Ulcer/skin breakdown will have a volume reduction of 80% by week 12 Date Initiated: 09/15/2017 Target Resolution Date:  01/15/2018 Goal Status:  Active Interventions: Assess patient/caregiver ability to perform ulcer/skin care regimen upon admission and as needed Assess ulceration(s) every visit Notes: Electronic Signature(s) Signed: 10/20/2017 4:47:31 PM By: Alric Quan Entered By: Alric Quan on 10/20/2017 10:10:56 Ariana [name] ([id number]) -------------------------------------------------------------------------------- Pain Assessment Details Patient Name: Ariana [name]. Date of Service: 10/20/2017 9:45 AM Medical Record Number: [id number] Patient Account Number: [id number] Date of Birth/Sex: 09-Sep-1933 (84 y.o. Female) Treating RN: Carolyne Fiscal, Debi Primary Care Nivek Powley: Fulton Reek Other Clinician: Referring Tyge Somers: Fulton Reek Treating Caleyah Jr/Extender: Tito Dine in Treatment: 5 Active Problems Location of Pain Severity and Description of Pain Patient Has Paino No Site Locations Pain Management and Medication Current Pain Management: Electronic Signature(s) Signed: 10/20/2017 4:47:31 PM By: Alric Quan Entered By: Alric Quan on 10/20/2017 09:59:29 Ariana [name] ([id number]) -------------------------------------------------------------------------------- Patient/Caregiver Education Details Patient Name: Ariana [name] Date of Service: 10/20/2017 9:45 AM Medical Record Number: [id number] Patient Account Number: [id number] Date of Birth/Gender: 27-Nov-1932 (84 y.o. Female) Treating RN: Ahmed Prima Primary Care Physician: Fulton Reek Other Clinician: Referring Physician: Fulton Reek Treating Physician/Extender: Tito Dine in Treatment: 5 Education Assessment Education Provided To: Patient Education Topics Provided Wound/Skin Impairment: Handouts: Caring for Your Ulcer, Other: change dressing as ordered Methods: Demonstration, Explain/Verbal Responses: State content correctly Electronic Signature(s) Signed: 10/20/2017 4:47:31 PM  By: Alric Quan Entered By: Alric Quan on 10/20/2017 10:15:44 Ariana [name] ([id number]) -------------------------------------------------------------------------------- Wound Assessment Details Patient Name: Ariana [name]. Date of Service: 10/20/2017 9:45 AM Medical Record Number: [id number] Patient Account Number: [id number] Date of Birth/Sex: [birth year] (84 y.o. Female) Treating RN: Carolyne Fiscal, Debi Primary Care Kalianne Fetting: Fulton Reek Other Clinician: Referring Shareka Casale: Fulton Reek Treating Bradlee Heitman/Extender: Tito Dine in Treatment: 5 Wound Status Wound Number: 1 Primary Etiology: Trauma, Other Wound Location: Left Lower Leg - Lateral Wound Status: Open Wounding Event: Trauma Comorbid History: Cataracts Date Acquired: 08/25/2017 Weeks Of Treatment: 5 Clustered Wound: No Photos Photo Uploaded By: Alric Quan on 10/20/2017 16:27:20 Wound Measurements Length: (cm) 3.8 Width: (cm) 2.3 Depth: (cm) 0.1 Area: (cm) 6.864 Volume: (cm) 0.686 % Reduction in Area: 42.5% % Reduction in Volume: 42.5% Epithelialization: None Tunneling: No Undermining: No Wound Description Classification: Partial Thickness Wound Margin: Distinct, outline attached Exudate Amount: Large Exudate Type: Serosanguineous Exudate Color: red, brown Foul Odor After Cleansing: No Slough/Fibrino Yes Wound Bed Granulation Amount: Large (67-100%) Exposed Structure Granulation Quality: Red Fascia Exposed: No Necrotic Amount: Small (1-33%) Fat Layer (Subcutaneous Tissue) Exposed: No Necrotic Quality: Adherent Slough Tendon Exposed: No Muscle Exposed: No Joint Exposed: No Bone Exposed: No Periwound Skin Texture Frontera, Shaquala E. ([id number]) Texture Color No Abnormalities Noted: No No Abnormalities Noted: No Excoriation: Yes Erythema: Yes Erythema Location: Circumferential Moisture No Abnormalities Noted: No Temperature / Pain Maceration:  No Temperature: No Abnormality Tenderness on Palpation: Yes Wound Preparation Ulcer Cleansing: Rinsed/Irrigated with Saline Topical Anesthetic Applied: Other: lidocaine 4%, Treatment Notes Wound #1 (Left, Lateral Lower Leg) 1. Cleansed with: Clean wound with Normal Saline 2. Anesthetic Topical Lidocaine 4% cream to wound bed prior to debridement 4. Dressing Applied: Hydrafera Blue Other dressing (specify in notes) 5. Secondary Dressing Applied Bordered Foam Dressing Notes adaptic Electronic Signature(s) Signed: 10/20/2017 4:47:31 PM By: Alric Quan Entered By: Alric Quan on 10/20/2017 10:07:36 Ariana [name] ([id number]) -------------------------------------------------------------------------------- Wound Assessment Details Patient Name: Ariana [name] Date of Service: 10/20/2017 9:45 AM Medical Record Number: [id number] Patient Account Number: [id number] Date of Birth/Sex: 31-May-1933 (84 y.o. Female) Treating RN: Ahmed Prima Primary Care  Julietta Batterman: Fulton Reek Other Clinician: Referring Dimitriy Carreras: Fulton Reek Treating Ngozi Alvidrez/Extender: Tito Dine in Treatment: 5 Wound Status Wound Number: 2 Primary Etiology: Trauma, Other Wound Location: Right Lower Leg - Medial Wound Status: Open Wounding Event: Trauma Comorbid History: Cataracts Date Acquired: 08/25/2017 Weeks Of Treatment: 5 Clustered Wound: No Photos Photo Uploaded By: Alric Quan on 10/20/2017 16:28:12 Wound Measurements Length: (cm) 1.8 Width: (cm) 0.9 Depth: (cm) 0.1 Area: (cm) 1.272 Volume: (cm) 0.127 % Reduction in Area: 90.3% % Reduction in Volume: 90.3% Epithelialization: None Tunneling: No Undermining: No Wound Description Classification: Partial Thickness Wound Margin: Distinct, outline attached Exudate Amount: Large Exudate Type: Serosanguineous Exudate Color: red, brown Foul Odor After Cleansing: No Slough/Fibrino Yes Wound  Bed Granulation Amount: Large (67-100%) Exposed Structure Granulation Quality: Red Fascia Exposed: No Necrotic Amount: Small (1-33%) Fat Layer (Subcutaneous Tissue) Exposed: No Necrotic Quality: Adherent Slough Tendon Exposed: No Muscle Exposed: No Joint Exposed: No Bone Exposed: No Periwound Skin Texture Folta, Baeleigh E. ([id number]) Texture Color No Abnormalities Noted: No No Abnormalities Noted: No Induration: Yes Erythema: Yes Erythema Location: Circumferential Moisture No Abnormalities Noted: No Temperature / Pain Maceration: Yes Temperature: No Abnormality Tenderness on Palpation: Yes Wound Preparation Ulcer Cleansing: Rinsed/Irrigated with Saline Topical Anesthetic Applied: Other: lidocaine 4%, Treatment Notes Wound #2 (Right, Medial Lower Leg) 1. Cleansed with: Clean wound with Normal Saline 2. Anesthetic Topical Lidocaine 4% cream to wound bed prior to debridement 4. Dressing Applied: Hydrafera Blue Other dressing (specify in notes) 5. Secondary Dressing Applied Bordered Foam Dressing Notes adaptic Electronic Signature(s) Signed: 10/20/2017 4:47:31 PM By: Alric Quan Entered By: Alric Quan on 10/20/2017 10:08:27 Ariana [name] ([id number]) -------------------------------------------------------------------------------- Wound Assessment Details Patient Name: Ariana [name] Date of Service: 10/20/2017 9:45 AM Medical Record Number: [id number] Patient Account Number: [id number] Date of Birth/Sex: 28-Jul-1933 (84 y.o. Female) Treating RN: Carolyne Fiscal, Debi Primary Care Tata Timmins: Fulton Reek Other Clinician: Referring Navdeep Halt: Fulton Reek Treating Chinita Schimpf/Extender: Tito Dine in Treatment: 5 Wound Status Wound Number: 3 Primary Etiology: Trauma, Other Wound Location: Right Lower Leg - Lateral Wound Status: Healed - Epithelialized Wounding Event: Trauma Comorbid History: Cataracts Date Acquired: 10/04/2017 Weeks  Of Treatment: 2 Clustered Wound: Yes Photos Photo Uploaded By: Alric Quan on 10/20/2017 16:28:12 Wound Measurements Length: (cm) 0 % Re Width: (cm) 0 % Re Depth: (cm) 0 Epit Clustered Quantity: 2 Tunn Area: (cm) 0 Und Volume: (cm) 0 duction in Area: 100% duction in Volume: 100% helialization: Large (67-100%) eling: No ermining: No Wound Description Classification: Partial Thickness Wound Margin: Distinct, outline attached Exudate Amount: None Present Foul Odor After Cleansing: No Slough/Fibrino No Wound Bed Granulation Amount: None Present (0%) Exposed Structure Necrotic Amount: None Present (0%) Fascia Exposed: No Fat Layer (Subcutaneous Tissue) Exposed: No Tendon Exposed: No Muscle Exposed: No Joint Exposed: No Bone Exposed: No Periwound Skin Texture Texture Color Peugh, Ysabella E. ([id number]) No Abnormalities Noted: No No Abnormalities Noted: No Moisture Temperature / Pain No Abnormalities Noted: No Temperature: No Abnormality Tenderness on Palpation: Yes Wound Preparation Ulcer Cleansing: Rinsed/Irrigated with Saline Topical Anesthetic Applied: None Electronic Signature(s) Signed: 10/20/2017 4:47:31 PM By: Alric Quan Entered By: Alric Quan on 10/20/2017 10:06:35 Ariana [name] ([id number]) -------------------------------------------------------------------------------- Vitals Details Patient Name: Ariana [name]. Date of Service: 10/20/2017 9:45 AM Medical Record Number: [id number] Patient Account Number: [id number] Date of Birth/Sex: [birth year] (84 y.o. Female) Treating RN: Carolyne Fiscal, Debi Primary Care Marigny Borre: Fulton Reek Other Clinician: Referring Casper Pagliuca: Fulton Reek Treating Conchita Truxillo/Extender: Tito Dine in Treatment: 5 Vital  Signs Time Taken: 09:59 Temperature (F): 98.3 Height (in): 65 Pulse (bpm): 58 Weight (lbs): 133.4 Respiratory Rate (breaths/min): 18 Body Mass Index (BMI): 22.2 Blood  Pressure (mmHg): 147/56 Reference Range: 80 - 120 mg / dl Electronic Signature(s) Signed: 10/20/2017 4:47:31 PM By: Alric Quan Entered By: Alric Quan on 10/20/2017 10:01:05

## 2017-10-24 NOTE — Progress Notes (Signed)
Ariana [name]  [phone]  [phone]     Ariana [name] DOB: 03-Mar-1933  MR#: [id number]  ACZ#:[id number]  Patient Care Team: Idelle Crouch, MD as PCP - General (Internal Medicine)  CHIEF COMPLAINT: Chronic refractory ITP  INTERVAL HISTORY:  Patient returns to clinic today for repeat laboratory work and consideration of additional Nplate. She continues to have increased weakness and fatigue the week leading up to her appointment. She continues to have easy bruising, but denies any bleeding. She has no neurologic complaints. She denies any fevers. She has no chest pain or shortness of breath. She denies any nausea, vomiting, constipation, or diarrhea. She has no urinary complaints. Patient offers no further specific complaints today.  REVIEW OF SYSTEMS:   Review of Systems  Constitutional: Positive for malaise/fatigue. Negative for fever and weight loss.  HENT: Negative for congestion.   Respiratory: Negative.  Negative for cough, hemoptysis and shortness of breath.   Cardiovascular: Negative.  Negative for chest pain and leg swelling.  Gastrointestinal: Negative for abdominal pain, blood in stool, diarrhea, melena and nausea.  Genitourinary: Negative.  Negative for dysuria and hematuria.  Musculoskeletal: Negative.   Skin: Negative.  Negative for rash.  Neurological: Positive for weakness. Negative for sensory change.  Endo/Heme/Allergies: Bruises/bleeds easily.  Psychiatric/Behavioral: Negative.  Negative for depression and memory loss. The patient is not nervous/anxious.     As per HPI. Otherwise, a complete review of systems is negative.  PAST MEDICAL HISTORY: Past Medical History:  Diagnosis Date  . Anemia   . Anxiety   . Asthma   . Chronic back pain   . Depression   . Depression   . GERD (gastroesophageal reflux disease)   . Hiatal hernia   . Hypercholesteremia   . ITP (idiopathic thrombocytopenic purpura)   .  Osteoarthritis   . Osteoporosis   . Pulmonary emboli ([place])   . Restless legs     PAST SURGICAL HISTORY: Past Surgical History:  Procedure Laterality Date  . CARPAL TUNNEL RELEASE    . ESOPHAGOGASTRODUODENOSCOPY (EGD) WITH PROPOFOL N/A 09/10/2015   Procedure: ESOPHAGOGASTRODUODENOSCOPY (EGD) WITH PROPOFOL;  Surgeon: Lollie Sails, MD;  Location: [facility] ENDOSCOPY;  Service: Endoscopy;  Laterality: N/A;  . LUMBAR [place]    . SPLENECTOMY, PARTIAL    . TOTAL ABDOMINAL HYSTERECTOMY      FAMILY HISTORY Family History  Problem Relation Age of Onset  . Stroke Mother     GYNECOLOGIC HISTORY:  No LMP recorded. Patient is postmenopausal.     ADVANCED DIRECTIVES:    HEALTH MAINTENANCE: Social History   Tobacco Use  . Smoking status: Never Smoker  . Smokeless tobacco: Never Used  Substance Use Topics  . Alcohol use: No    Alcohol/week: 0.0 oz  . Drug use: No     Allergies  Allergen Reactions  . Aspirin     Other reaction(s): Distress (finding)  . Diazepam     Other reaction(s): Itching of Skin sneezing  . Morphine     Other reaction(s): Itching of Skin  . Other     Other reaction(s): Unknown seasonal allergies  . Tetanus Toxoids     Other reaction(s): Localized superficial swelling of skin    Current Outpatient Medications  Medication Sig Dispense Refill  . acetaminophen (TYLENOL) 325 MG tablet Take 2 tablets (650 mg total) by mouth every 6 (six) hours as needed for mild pain (or Fever >/= 101). 100 tablet 0  . albuterol (PROVENTIL HFA;VENTOLIN HFA) 108 (  90 BASE) MCG/ACT inhaler Inhale 2 puffs into the lungs every 6 (six) hours as needed for wheezing or shortness of breath.    . ALPRAZolam (XANAX) 0.5 MG tablet Take 0.5 mg by mouth 3 (three) times daily as needed for anxiety.     Marland Kitchen buPROPion (WELLBUTRIN XL) 150 MG 24 hr tablet Take 150 mg by mouth daily.    . Calcium Carbonate-Vitamin D (CALCIUM 600+D) 600-400 MG-UNIT per tablet Take 1 tablet by mouth daily.     . citalopram (CELEXA) 40 MG tablet Take 40 mg by mouth daily.    . Cyanocobalamin (RA VITAMIN B-12 TR) 1000 MCG TBCR Take 1,000 mcg by mouth daily.     Marland Kitchen donepezil (ARICEPT) 5 MG tablet Take 10 mg by mouth at bedtime.     . fluticasone (FLONASE) 50 MCG/ACT nasal spray Place into the nose.    Marland Kitchen Fluticasone-Salmeterol (ADVAIR) 250-50 MCG/DOSE AEPB Inhale 1 puff into the lungs daily as needed.     . gabapentin (NEURONTIN) 300 MG capsule Take 300 mg by mouth 4 (four) times daily.    Marland Kitchen HYDROcodone-acetaminophen (NORCO) 7.5-325 MG tablet 1 tablet by mouth q 6 h prn    . levothyroxine (SYNTHROID, LEVOTHROID) 50 MCG tablet Take 50 mcg by mouth daily before breakfast. Take 30 to 60 minutes before breakfast.    . LORazepam (ATIVAN) 0.5 MG tablet Take 1 tablet (0.5 mg total) by mouth every 8 (eight) hours as needed for anxiety. 30 tablet 0  . montelukast (SINGULAIR) 10 MG tablet Take by mouth.    . Multiple Vitamins-Minerals (HAIR SKIN AND NAILS FORMULA PO) Take 1 tablet by mouth every evening.    . Multiple Vitamins-Minerals (OCUVITE-LUTEIN PO) Take 1 capsule by mouth daily.     Marland Kitchen oxybutynin (DITROPAN) 5 MG tablet Take 5 mg by mouth 3 (three) times daily.    . pantoprazole (PROTONIX) 20 MG tablet Take 20 mg by mouth 2 (two) times daily.     . phenazopyridine (PYRIDIUM) 200 MG tablet Take 1 tablet (200 mg total) by mouth 3 (three) times daily as needed for pain. 15 tablet 0  . polycarbophil (FIBERCON) 625 MG tablet Take by mouth.    . pravastatin (PRAVACHOL) 20 MG tablet Take 20 mg by mouth daily.    . traMADol (ULTRAM) 50 MG tablet Take 50 mg by mouth 3 (three) times daily as needed for moderate pain or severe pain. pain    . vitamin C (ASCORBIC ACID) 500 MG tablet Take 500 mg by mouth daily.    . vitamin E (E-400) 400 UNIT capsule Take 400 Units by mouth daily.    . memantine (NAMENDA) 5 MG tablet Take 5 mg by mouth 2 (two) times daily.      No current facility-administered medications for this  visit.     OBJECTIVE: BP 135/80   Pulse 75   Temp 97.7 F (36.5 C) (Tympanic)   Resp 20   Wt 142 lb 8 oz (64.6 kg)   BMI 26.06 kg/m    Body mass index is 26.06 kg/m.    ECOG FS:1 - Symptomatic but completely ambulatory  General: Well-developed, well-nourished, no acute distress. Eyes: Pink conjunctiva, anicteric sclera. HEENT: Normocephalic, moist mucous membranes, clear oropharnyx. Lungs: Clear to auscultation bilaterally. Heart: Regular rate and rhythm. No rubs, murmurs, or gallops. Musculoskeletal: No edema, cyanosis, or clubbing. Neuro: Alert, answering all questions appropriately. Cranial nerves grossly intact. Skin: No petechiae noted. Bruising noted in various stages of healing. Psych: Normal affect.  LAB RESULTS:  Appointment on 10/28/2017  Component Date Value Ref Range Status  . WBC 10/28/2017 12.2* 3.6 - 11.0 K/uL Final  . RBC 10/28/2017 3.93  3.80 - 5.20 MIL/uL Final  . Hemoglobin 10/28/2017 10.7* 12.0 - 16.0 g/dL Final  . HCT 10/28/2017 34.3* 35.0 - 47.0 % Final  . MCV 10/28/2017 87.2  80.0 - 100.0 fL Final  . MCH 10/28/2017 27.2  26.0 - 34.0 pg Final  . MCHC 10/28/2017 31.2* 32.0 - 36.0 g/dL Final  . RDW 10/28/2017 16.9* 11.5 - 14.5 % Final  . Platelets 10/28/2017 39* 150 - 400 K/uL Final   Comment: RESULT REPEATED AND VERIFIED PLATELET COUNT CONFIRMED BY SMEAR GIANT PLATELETS SEEN PLATELETS VARY IN SIZE Performed at [facility], [address] [place], [place] [id number]     STUDIES: No results found.  ASSESSMENT:  Chronic refractory ITP  PLAN:   1. Chronic refractory ITP: Patient had a poor response to Prednisone, WinRho, and Rituxan. She is status post splenectomy in 2007. Her platelet count is less than 100, therefore will proceed with 7 mcg/kg Nplate today. Return to clinic in 4 weeks for laboratory work, further evaluation, and consideration of Nplate if her platelet is below 100. Patient appears to only requiring injections every 3-4  weeks. Can consider Promacta 12.5mg  at a later date if necessary. Max dose of Nplate is 10 mcg/kg. 2. Abdominal pain: Resolved. CT scan did not reveal any distinct etiology. 3. Constipation: Patient does not complain of this today. 4. Weakness and fatigue: Multifactorial. Although patient states she feels improved after receiving Nplate. 5. Depression/dementia: Monitor. Continue follow-up with primary care.  Approximately 30 minutes spent in discussion of which greater than 50% was consultation.  Patient expressed understanding and was in agreement with this plan. She also understands that She can call clinic at any time with any questions, concerns, or complaints.    Lloyd Huger, MD   10/31/2017 11:09 AM

## 2017-10-25 DIAGNOSIS — D693 Immune thrombocytopenic purpura: Secondary | ICD-10-CM | POA: Diagnosis not present

## 2017-10-25 DIAGNOSIS — F0390 Unspecified dementia without behavioral disturbance: Secondary | ICD-10-CM | POA: Diagnosis not present

## 2017-10-25 DIAGNOSIS — S8011XD Contusion of right lower leg, subsequent encounter: Secondary | ICD-10-CM | POA: Diagnosis not present

## 2017-10-25 DIAGNOSIS — M62830 Muscle spasm of back: Secondary | ICD-10-CM | POA: Diagnosis not present

## 2017-10-25 DIAGNOSIS — J45909 Unspecified asthma, uncomplicated: Secondary | ICD-10-CM | POA: Diagnosis not present

## 2017-10-25 DIAGNOSIS — D696 Thrombocytopenia, unspecified: Secondary | ICD-10-CM | POA: Diagnosis not present

## 2017-10-25 DIAGNOSIS — S8012XD Contusion of left lower leg, subsequent encounter: Secondary | ICD-10-CM | POA: Diagnosis not present

## 2017-10-25 DIAGNOSIS — I10 Essential (primary) hypertension: Secondary | ICD-10-CM | POA: Diagnosis not present

## 2017-10-25 DIAGNOSIS — M5136 Other intervertebral disc degeneration, lumbar region: Secondary | ICD-10-CM | POA: Diagnosis not present

## 2017-10-25 DIAGNOSIS — I4891 Unspecified atrial fibrillation: Secondary | ICD-10-CM | POA: Diagnosis not present

## 2017-10-25 DIAGNOSIS — M5030 Other cervical disc degeneration, unspecified cervical region: Secondary | ICD-10-CM | POA: Diagnosis not present

## 2017-10-25 DIAGNOSIS — M5416 Radiculopathy, lumbar region: Secondary | ICD-10-CM | POA: Diagnosis not present

## 2017-10-27 ENCOUNTER — Encounter: Payer: Medicare HMO | Admitting: Internal Medicine

## 2017-10-27 DIAGNOSIS — E119 Type 2 diabetes mellitus without complications: Secondary | ICD-10-CM | POA: Diagnosis not present

## 2017-10-27 DIAGNOSIS — S8011XD Contusion of right lower leg, subsequent encounter: Secondary | ICD-10-CM | POA: Diagnosis not present

## 2017-10-27 DIAGNOSIS — S81801A Unspecified open wound, right lower leg, initial encounter: Secondary | ICD-10-CM | POA: Diagnosis not present

## 2017-10-27 DIAGNOSIS — D693 Immune thrombocytopenic purpura: Secondary | ICD-10-CM | POA: Diagnosis not present

## 2017-10-27 DIAGNOSIS — S8012XD Contusion of left lower leg, subsequent encounter: Secondary | ICD-10-CM | POA: Diagnosis not present

## 2017-10-27 DIAGNOSIS — S81802A Unspecified open wound, left lower leg, initial encounter: Secondary | ICD-10-CM | POA: Diagnosis not present

## 2017-10-28 ENCOUNTER — Other Ambulatory Visit: Payer: Self-pay

## 2017-10-28 ENCOUNTER — Inpatient Hospital Stay: Payer: Medicare HMO | Attending: Oncology

## 2017-10-28 ENCOUNTER — Inpatient Hospital Stay: Payer: Medicare HMO

## 2017-10-28 ENCOUNTER — Encounter: Payer: Self-pay | Admitting: Oncology

## 2017-10-28 ENCOUNTER — Inpatient Hospital Stay (HOSPITAL_BASED_OUTPATIENT_CLINIC_OR_DEPARTMENT_OTHER): Payer: Medicare HMO | Admitting: Oncology

## 2017-10-28 VITALS — BP 135/80 | HR 75 | Temp 97.7°F | Resp 20 | Wt 142.5 lb

## 2017-10-28 DIAGNOSIS — F0390 Unspecified dementia without behavioral disturbance: Secondary | ICD-10-CM | POA: Diagnosis not present

## 2017-10-28 DIAGNOSIS — R5383 Other fatigue: Secondary | ICD-10-CM

## 2017-10-28 DIAGNOSIS — D693 Immune thrombocytopenic purpura: Secondary | ICD-10-CM

## 2017-10-28 DIAGNOSIS — F329 Major depressive disorder, single episode, unspecified: Secondary | ICD-10-CM

## 2017-10-28 DIAGNOSIS — Z9081 Acquired absence of spleen: Secondary | ICD-10-CM | POA: Diagnosis not present

## 2017-10-28 DIAGNOSIS — R531 Weakness: Secondary | ICD-10-CM | POA: Insufficient documentation

## 2017-10-28 LAB — CBC
HCT: 34.3 % — ABNORMAL LOW (ref 35.0–47.0)
Hemoglobin: 10.7 g/dL — ABNORMAL LOW (ref 12.0–16.0)
MCH: 27.2 pg (ref 26.0–34.0)
MCHC: 31.2 g/dL — ABNORMAL LOW (ref 32.0–36.0)
MCV: 87.2 fL (ref 80.0–100.0)
PLATELETS: 39 10*3/uL — AB (ref 150–400)
RBC: 3.93 MIL/uL (ref 3.80–5.20)
RDW: 16.9 % — AB (ref 11.5–14.5)
WBC: 12.2 10*3/uL — AB (ref 3.6–11.0)

## 2017-10-28 MED ORDER — ROMIPLOSTIM INJECTION 500 MCG
430.0000 ug | Freq: Once | SUBCUTANEOUS | Status: AC
  Administered 2017-10-28: 430 ug via SUBCUTANEOUS
  Filled 2017-10-28: qty 0.86

## 2017-10-28 NOTE — Progress Notes (Signed)
KRISS, PERLEBERG ([id number]) Visit Report for 10/27/2017 HPI Details Patient Name: Ariana [name], Ariana [name]. Date of Service: 10/27/2017 12:30 PM Medical Record Number: [id number] Patient Account Number: [id number] Date of Birth/Sex: [birth year] (84 y.o. Female) Treating RN: Cornell Barman Primary Care Provider: Fulton Reek Other Clinician: Referring Provider: [place] Reek Treating Provider/Extender: Tito Dine in Treatment: 6 History of Present Illness HPI Description: 09/15/17; this is an 84 year old woman with a history of type 2 diabetes, chronic ITP for which she follows with oncology. She had a splenectomy in 2007. 3 weeks ago well sitting in the bathroom she fell off the platelet and hit her legs on the bathtub. She had 2 large hematomas. The area on the left has ruptured into a wound where the area on the right medial lower leg is actually still intact. She was given Keflex of concern for coexistent cellulitis on 11/28 the area on the right is still intact and she is not doing anything specific for that. She has a history of small hematomas related to thrombocytopenia on her lower legs although these all seem to have primarily healed. She has a history of falls 09/22/17; this is a patient who had traumatic hematomas on both her legs. When we admitted to clinic last week the left one had already evacuated whereas the right one had not. She arrived in clinic today with drainage out of the right side. We used Hydrofera Blue to the left 09/29/17; this is a patient who had a traumatic hematoma on both her legs. She has chronic ITP and states she bruises very easily. The right leg hematoma evacuated last week. We have been using Hydrofera Blue to both wound areas on both anterior legs. Today the one on the left is slightly hyper-granulated if the wound stalls we may need to address this. Otherwise the wounds are smaller and look healthy today 10/06/17; traumatic hematomas on both  her legs left greater than right. She has chronic venous insufficiency also chronic ITP. We have been using Hydrofera Blue to both of these wound areas. She has new wounds on the right lateral leg caused by her dog 10/20/17; traumatic hematomas on both her legs left greater than right which is left her with chronic bilateral wounds. She has chronic venous insufficiency also chronic ITP. We've been using Hydrofera Blue to both of these wound areas largely because of hyperventilation. The right lateral leg wound was caused last visit by her dog. The left leg wound was caused by trauma against her bathtub. 10/27/17; these were initially traumatic hematomas on both her legs left greater than right. She hyper-granulation last week and I used silver nitrate. We've been using Hydrofera Blue to the wounds. Much better today especially on the left Electronic Signature(s) Signed: 10/27/2017 4:56:29 PM By: Linton Ham MD Entered By: Linton Ham on 10/27/2017 12:56:00 Ariana [name] ([id number]) -------------------------------------------------------------------------------- Physical Exam Details Patient Name: Ariana [name] Date of Service: 10/27/2017 12:30 PM Medical Record Number: [id number] Patient Account Number: [id number] Date of Birth/Sex: Jul 13, 1933 (84 y.o. Female) Treating RN: Cornell Barman Primary Care Provider: Fulton Reek Other Clinician: Referring Provider: Fulton Reek Treating Provider/Extender: Tito Dine in Treatment: 6 Constitutional Sitting or standing Blood Pressure is within target range for patient.. Pulse regular and within target range for patient.Marland Kitchen Respirations regular, non-labored and within target range.. Temperature is normal and within the target range for the patient.Marland Kitchen appears in no distress. Eyes Conjunctivae clear. No discharge. Respiratory Respiratory effort is easy and symmetric bilaterally. Rate  is normal at rest and on room  air.. Cardiovascular Pedal pulses palpable and strong bilaterally.. Gastrointestinal (GI) Abdomen is soft and non-distended without masses or tenderness. Bowel sounds active in all quadrants.. Lymphatic None palpable in the popliteal or inguinal area. Integumentary (Hair, Skin) no rash is seen. Notes Wound exam; on the left anterior leg there is almost complete early epithelialization, only to small open areas. This is particularly visible under the light oon the right anterior medial leg wound. This is smaller but not is well epithelialized. Electronic Signature(s) Signed: 10/27/2017 4:56:29 PM By: Linton Ham MD Entered By: Linton Ham on 10/27/2017 13:00:16 Ariana [name] ([id number]) -------------------------------------------------------------------------------- Physician Orders Details Patient Name: Ariana [name] Date of Service: 10/27/2017 12:30 PM Medical Record Number: [id number] Patient Account Number: [id number] Date of Birth/Sex: Jul 22, 1933 (84 y.o. Female) Treating RN: Cornell Barman Primary Care Provider: Fulton Reek Other Clinician: Referring Provider: Fulton Reek Treating Provider/Extender: Tito Dine in Treatment: 6 Verbal / Phone Orders: No Diagnosis Coding ICD-10 Coding Code Description S80.12XD Contusion of left lower leg, subsequent encounter S80.11XD Contusion of right lower leg, subsequent encounter D69.3 Immune thrombocytopenic purpura Wound Cleansing Wound #1 Left,Lateral Lower Leg o Clean wound with Normal Saline. o Cleanse wound with mild soap and water o May Shower, gently pat wound dry prior to applying new dressing. Wound #2 Right,Medial Lower Leg o Clean wound with Normal Saline. o Cleanse wound with mild soap and water o May Shower, gently pat wound dry prior to applying new dressing. Skin Barriers/Peri-Wound Care Wound #1 Left,Lateral Lower Leg o Skin Prep Wound #2 Right,Medial Lower  Leg o Skin Prep Primary Wound Dressing Wound #1 Left,Lateral Lower Leg o Hydrafera Blue o Other: - adaptic Wound #2 Right,Medial Lower Leg o Hydrafera Blue o Other: - adaptic Secondary Dressing Wound #1 Left,Lateral Lower Leg o Boardered Foam Dressing Wound #2 Right,Medial Lower Leg o Boardered Foam Dressing Dressing Change Frequency Wound #1 Left,Lateral Lower Leg GENENE, KILMAN ([id number]) o Change Dressing Monday, Wednesday, Friday - HHRN to change dressing to change dressing Mondays and Fridays and pt will come to [facility] on Wednesdays. Wound #2 Right,Medial Lower Leg o Change Dressing Monday, Wednesday, Friday - HHRN to change dressing to change dressing Mondays and Fridays and pt will come to [facility] on Wednesdays. Follow-up Appointments Wound #1 Left,Lateral Lower Leg o Return Appointment in 1 week. Wound #2 Right,Medial Lower Leg o Return Appointment in 1 week. Edema Control Wound #1 Left,Lateral Lower Leg o Elevate legs to the level of the heart and pump ankles as often as possible Wound #2 Right,Medial Lower Leg o Elevate legs to the level of the heart and pump ankles as often as possible Additional Orders / Instructions Wound #1 Left,Lateral Lower Leg o Increase protein intake. Wound #2 Right,Medial Lower Leg o Increase protein intake. Home Health Wound #1 [place] Nurse may visit PRN to address patientos wound care needs. o FACE TO FACE ENCOUNTER: MEDICARE and MEDICAID PATIENTS: I certify that this patient is under my care and that I had a face-to-face encounter that meets the physician face-to-face encounter requirements with this patient on this date. The encounter with the patient was in whole or in part for the following MEDICAL CONDITION: (primary reason for [place]) MEDICAL NECESSITY: I certify, that based on my  findings, NURSING services are a medically necessary home health service. HOME BOUND STATUS: I certify that my clinical findings  support that this patient is homebound (i.e., Due to illness or injury, pt requires aid of supportive devices such as crutches, cane, wheelchairs, walkers, the use of special transportation or the assistance of another person to leave their place of residence. There is a normal inability to leave the home and doing so requires considerable and taxing effort. Other absences are for medical reasons / religious services and are infrequent or of short duration when for other reasons). o If current dressing causes regression in wound condition, may D/C ordered dressing product/s and apply Normal Saline Moist Dressing daily until next [place] / Other MD appointment. [place] of regression in wound condition at [phone]. o Please direct any NON-WOUND related issues/requests for orders to patient's Primary Care Physician Wound #2 [place] Visits o Home Health Nurse may visit PRN to address patientos wound care needs. o FACE TO FACE ENCOUNTER: MEDICARE and MEDICAID PATIENTS: I certify that this patient is under my care and that I had a face-to-face encounter that meets the physician face-to-face encounter requirements with this patient on this date. The encounter with the patient was in whole or in part for the following MEDICAL CONDITION: (primary reason for [place]) MEDICAL NECESSITY: I certify, that based on my findings, NURSING services are a medically necessary home health service. HOME BOUND STATUS: I certify that my clinical findings support that this patient is homebound (i.e., Due to illness or injury, pt requires aid of TESHARA, MOREE. ([id number]) supportive devices such as crutches, cane, wheelchairs, walkers, the use of special transportation or the assistance of another  person to leave their place of residence. There is a normal inability to leave the home and doing so requires considerable and taxing effort. Other absences are for medical reasons / religious services and are infrequent or of short duration when for other reasons). o If current dressing causes regression in wound condition, may D/C ordered dressing product/s and apply Normal Saline Moist Dressing daily until next [employer] / Other MD appointment. [place] of regression in wound condition at [phone]. o Please direct any NON-WOUND related issues/requests for orders to patient's Primary Care Physician Electronic Signature(s) Signed: 10/27/2017 4:27:21 PM By: Gretta Cool, BSN, RN, CWS, Kim RN, BSN Signed: 10/27/2017 4:56:29 PM By: Linton Ham MD Entered By: Gretta Cool, BSN, RN, CWS, Kim on 10/27/2017 12:59:25 Ariana [name] ([id number]) -------------------------------------------------------------------------------- Problem List Details Patient Name: Ariana [name] Date of Service: 10/27/2017 12:30 PM Medical Record Number: [id number] Patient Account Number: [id number] Date of Birth/Sex: 1933-01-25 (84 y.o. Female) Treating RN: Cornell Barman Primary Care Provider: Fulton Reek Other Clinician: Referring Provider: Fulton Reek Treating Provider/Extender: Tito Dine in Treatment: 6 Active Problems ICD-10 Encounter Code Description Active Date Diagnosis S80.12XD Contusion of left lower leg, subsequent encounter 09/15/2017 Yes S80.11XD Contusion of right lower leg, subsequent encounter 09/15/2017 Yes D69.3 Immune thrombocytopenic purpura 09/15/2017 Yes Inactive Problems Resolved Problems Electronic Signature(s) Signed: 10/27/2017 4:56:29 PM By: Linton Ham MD Entered By: Linton Ham on 10/27/2017 12:54:57 Ariana [name] ([id number]) -------------------------------------------------------------------------------- Progress  Note Details Patient Name: Ariana [name]. Date of Service: 10/27/2017 12:30 PM Medical Record Number: [id number] Patient Account Number: [id number] Date of Birth/Sex: [birth year] (84 y.o. Female) Treating RN: Cornell Barman Primary Care Provider: Fulton Reek Other Clinician: Referring Provider: Fulton Reek Treating Provider/Extender: Tito Dine in Treatment: 6 Subjective History of Present Illness (HPI) 09/15/17; this is an 84 year old woman with a history  of type 2 diabetes, chronic ITP for which she follows with oncology. She had a splenectomy in 2007. 3 weeks ago well sitting in the bathroom she fell off the platelet and hit her legs on the bathtub. She had 2 large hematomas. The area on the left has ruptured into a wound where the area on the right medial lower leg is actually still intact. She was given Keflex of concern for coexistent cellulitis on 11/28 the area on the right is still intact and she is not doing anything specific for that. She has a history of small hematomas related to thrombocytopenia on her lower legs although these all seem to have primarily healed. She has a history of falls 09/22/17; this is a patient who had traumatic hematomas on both her legs. When we admitted to clinic last week the left one had already evacuated whereas the right one had not. She arrived in clinic today with drainage out of the right side. We used Hydrofera Blue to the left 09/29/17; this is a patient who had a traumatic hematoma on both her legs. She has chronic ITP and states she bruises very easily. The right leg hematoma evacuated last week. We have been using Hydrofera Blue to both wound areas on both anterior legs. Today the one on the left is slightly hyper-granulated if the wound stalls we may need to address this. Otherwise the wounds are smaller and look healthy today 10/06/17; traumatic hematomas on both her legs left greater than right. She has chronic venous  insufficiency also chronic ITP. We have been using Hydrofera Blue to both of these wound areas. She has new wounds on the right lateral leg caused by her dog 10/20/17; traumatic hematomas on both her legs left greater than right which is left her with chronic bilateral wounds. She has chronic venous insufficiency also chronic ITP. We've been using Hydrofera Blue to both of these wound areas largely because of hyperventilation. The right lateral leg wound was caused last visit by her dog. The left leg wound was caused by trauma against her bathtub. 10/27/17; these were initially traumatic hematomas on both her legs left greater than right. She hyper-granulation last week and I used silver nitrate. We've been using Hydrofera Blue to the wounds. Much better today especially on the left Objective Constitutional Sitting or standing Blood Pressure is within target range for patient.. Pulse regular and within target range for patient.Marland Kitchen Respirations regular, non-labored and within target range.. Temperature is normal and within the target range for the patient.Marland Kitchen appears in no distress. Vitals Time Taken: 12:38 PM, Height: 65 in, Weight: 133.4 lbs, BMI: 22.2, Temperature: 97.7 F, Pulse: 56 bpm, Respiratory Rate: 16 breaths/min, Blood Pressure: 113/85 mmHg. Eyes Conjunctivae clear. No discharge. Respiratory Respiratory effort is easy and symmetric bilaterally. Rate is normal at rest and on room air.Huey Bienenstock, Lisabeth Devoid ([id number]) Cardiovascular Pedal pulses palpable and strong bilaterally.. Gastrointestinal (GI) Abdomen is soft and non-distended without masses or tenderness. Bowel sounds active in all quadrants.. Lymphatic None palpable in the popliteal or inguinal area. General Notes: Wound exam; on the left anterior leg there is almost complete early epithelialization, only to small open areas. This is particularly visible under the light on the right anterior medial leg wound. This is smaller  but not is well epithelialized. Integumentary (Hair, Skin) no rash is seen. Wound #1 status is Open. Original cause of wound was Trauma. The wound is located on the Left,Lateral Lower Leg. The wound measures 3.8cm length x 2cm  width x 0.1cm depth; 5.969cm^2 area and 0.597cm^3 volume. The wound is limited to skin breakdown. There is no tunneling or undermining noted. There is a large amount of serosanguineous drainage noted. The wound margin is distinct with the outline attached to the wound base. There is large (67-100%) red granulation within the wound bed. There is a small (1-33%) amount of necrotic tissue within the wound bed including Adherent Slough. The periwound skin appearance exhibited: Scarring. The periwound skin appearance did not exhibit: Callus, Crepitus, Excoriation, Induration, Rash, Dry/Scaly, Maceration, Atrophie Blanche, Cyanosis, Ecchymosis, Hemosiderin Staining, Mottled, Pallor, Rubor, Erythema. Periwound temperature was noted as No Abnormality. The periwound has tenderness on palpation. Wound #2 status is Open. Original cause of wound was Trauma. The wound is located on the Right,Medial Lower Leg. The wound measures 1.5cm length x 0.1cm width x 0.1cm depth; 0.118cm^2 area and 0.012cm^3 volume. There is no tunneling or undermining noted. There is a large amount of serosanguineous drainage noted. The wound margin is distinct with the outline attached to the wound base. There is large (67-100%) red granulation within the wound bed. There is a small (1-33%) amount of necrotic tissue within the wound bed including Adherent Slough. The periwound skin appearance did not exhibit: Callus, Crepitus, Excoriation, Induration, Rash, Scarring, Dry/Scaly, Maceration, Atrophie Blanche, Cyanosis, Ecchymosis, Hemosiderin Staining, Mottled, Pallor, Rubor, Erythema. Periwound temperature was noted as No Abnormality. The periwound has tenderness on palpation. Assessment Active  Problems ICD-10 S80.12XD - Contusion of left lower leg, subsequent encounter S80.11XD - Contusion of right lower leg, subsequent encounter D69.3 - Immune thrombocytopenic purpura Plan Wound Cleansing: Wound #1 Left,Lateral Lower Leg: Clean wound with Normal Saline. Cleanse wound with mild soap and water IVORIE, UPLINGER ([id number]) May Shower, gently pat wound dry prior to applying new dressing. Wound #2 Right,Medial Lower Leg: Clean wound with Normal Saline. Cleanse wound with mild soap and water May Shower, gently pat wound dry prior to applying new dressing. Skin Barriers/Peri-Wound Care: Wound #1 Left,Lateral Lower Leg: Skin Prep Wound #2 Right,Medial Lower Leg: Skin Prep Primary Wound Dressing: Wound #1 Left,Lateral Lower Leg: Hydrafera Blue Other: - adaptic Wound #2 Right,Medial Lower Leg: Hydrafera Blue Other: - adaptic Secondary Dressing: Wound #1 Left,Lateral Lower Leg: Boardered Foam Dressing Wound #2 Right,Medial Lower Leg: Boardered Foam Dressing Dressing Change Frequency: Wound #1 Left,Lateral Lower Leg: Change Dressing Monday, Wednesday, Friday - HHRN to change dressing to change dressing Mondays and Fridays and pt will come to [facility] on Wednesdays. Wound #2 Right,Medial Lower Leg: Change Dressing Monday, Wednesday, Friday - HHRN to change dressing to change dressing Mondays and Fridays and pt will come to [facility] on Wednesdays. Follow-up Appointments: Wound #1 Left,Lateral Lower Leg: Return Appointment in 1 week. Wound #2 Right,Medial Lower Leg: Return Appointment in 1 week. Edema Control: Wound #1 Left,Lateral Lower Leg: Elevate legs to the level of the heart and pump ankles as often as possible Wound #2 Right,Medial Lower Leg: Elevate legs to the level of the heart and pump ankles as often as possible Additional Orders / Instructions: Wound #1 Left,Lateral Lower Leg: Increase protein intake. Wound #2 Right,Medial Lower  Leg: Increase protein intake. Home Health: Wound #1 Left,Lateral Lower Leg: [place] Nurse may visit PRN to address patient s wound care needs. FACE TO FACE ENCOUNTER: MEDICARE and MEDICAID PATIENTS: I certify that this patient is under my care and that I had a face-to-face encounter that meets the physician face-to-face encounter requirements with this patient  on this date. The encounter with the patient was in whole or in part for the following MEDICAL CONDITION: (primary reason for [place]) MEDICAL NECESSITY: I certify, that based on my findings, NURSING services are a medically necessary home health service. HOME BOUND STATUS: I certify that my clinical findings support that this patient is homebound (i.e., Due to illness or injury, pt requires aid of supportive devices such as crutches, cane, wheelchairs, walkers, the use of special transportation or the assistance of another person to leave their place of residence. There is a normal inability to leave the home and doing so requires considerable and taxing effort. Other absences are for medical reasons / religious services and are infrequent or of short duration when for other reasons). If current dressing causes regression in wound condition, may D/C ordered dressing product/s and apply Normal Saline Moist Dressing daily until next [place] / Other MD appointment. [place] of regression in wound condition at [phone]. FELECIA, STANFILL ([id number]) Please direct any NON-WOUND related issues/requests for orders to patient's Primary Care Physician Wound #2 Right,Medial Lower Leg: [place] Nurse may visit PRN to address patient s wound care needs. FACE TO FACE ENCOUNTER: MEDICARE and MEDICAID PATIENTS: I certify that this patient is under my care and that I had a face-to-face encounter that meets the physician face-to-face encounter  requirements with this patient on this date. The encounter with the patient was in whole or in part for the following MEDICAL CONDITION: (primary reason for [place]) MEDICAL NECESSITY: I certify, that based on my findings, NURSING services are a medically necessary home health service. HOME BOUND STATUS: I certify that my clinical findings support that this patient is homebound (i.e., Due to illness or injury, pt requires aid of supportive devices such as crutches, cane, wheelchairs, walkers, the use of special transportation or the assistance of another person to leave their place of residence. There is a normal inability to leave the home and doing so requires considerable and taxing effort. Other absences are for medical reasons / religious services and are infrequent or of short duration when for other reasons). If current dressing causes regression in wound condition, may D/C ordered dressing product/s and apply Normal Saline Moist Dressing daily until next [place] / Other MD appointment. [place] of regression in wound condition at [phone]. Please direct any NON-WOUND related issues/requests for orders to patient's Primary Care Physician hyrofera blue to both wound areas with border foam Electronic Signature(s) Signed: 10/27/2017 1:01:17 PM By: Linton Ham MD Entered By: Linton Ham on 10/27/2017 13:01:16 Ariana [name] ([id number]) -------------------------------------------------------------------------------- SuperBill Details Patient Name: Ariana [name]. Date of Service: 10/27/2017 Medical Record Number: [id number] Patient Account Number: [id number] Date of Birth/Sex: [birth year] (84 y.o. Female) Treating RN: Cornell Barman Primary Care Provider: Fulton Reek Other Clinician: Referring Provider: Fulton Reek Treating Provider/Extender: Tito Dine in Treatment: 6 Diagnosis Coding ICD-10 Codes Code  Description S80.12XD Contusion of left lower leg, subsequent encounter S80.11XD Contusion of right lower leg, subsequent encounter D69.3 Immune thrombocytopenic purpura Facility Procedures CPT4 Code: [id number] Description: 26834 - WOUND CARE VISIT-LEV 3 EST PT Modifier: Quantity: 1 Physician Procedures CPT4 Code: [id number] Description: 79892 - WC PHYS LEVEL 3 - EST PT ICD-10 Diagnosis Description S80.12XD Contusion of left lower leg, subsequent encounter S80.11XD Contusion of right lower leg, subsequent encounter Modifier: Quantity: 1 Electronic Signature(s) Signed: 10/27/2017 4:56:29 PM By: Linton Ham MD Entered By: Dellia Nims,  Donyelle Enyeart on 10/27/2017 13:43:31

## 2017-10-28 NOTE — Progress Notes (Signed)
Patient denies any concerns today.  

## 2017-10-28 NOTE — Progress Notes (Addendum)
Ariana [name], Ariana [name] ([id number]) Visit Report for 10/27/2017 Arrival Information Details Patient Name: Ariana [name], Ariana [name]. Date of Service: 10/27/2017 12:30 PM Medical Record Number: [id number] Patient Account Number: [id number] Date of Birth/Sex: 20-Jul-1933 (84 y.o. Female) Treating RN: Cornell Barman Primary Care Tamberlyn Midgley: Fulton Reek Other Clinician: Referring Willodean Leven: Fulton Reek Treating Lynzee Lindquist/Extender: Tito Dine in Treatment: 6 Visit Information History Since Last Visit Added or deleted any medications: No Patient Arrived: Ambulatory Any new allergies or adverse reactions: No Arrival Time: 12:38 Had a fall or experienced change in No Accompanied By: husband activities of daily living that may affect Transfer Assistance: None risk of falls: Patient Identification Verified: Yes Signs or symptoms of abuse/neglect since last visito No Secondary Verification Process Completed: Yes Hospitalized since last visit: No Patient Requires Transmission-Based No Has Dressing in Place as Prescribed: Yes Precautions: Pain Present Now: No Patient Has Alerts: No Electronic Signature(s) Signed: 10/27/2017 4:27:21 PM By: Gretta Cool, BSN, RN, CWS, Kim RN, BSN Entered By: Gretta Cool, BSN, RN, CWS, Kim on 10/27/2017 12:38:21 Ariana [name] ([id number]) -------------------------------------------------------------------------------- Clinic Level of Care Assessment Details Patient Name: Ariana [name] Date of Service: 10/27/2017 12:30 PM Medical Record Number: [id number] Patient Account Number: [id number] Date of Birth/Sex: 1933/04/19 (84 y.o. Female) Treating RN: Cornell Barman Primary Care Tymia Streb: Fulton Reek Other Clinician: Referring Maya Scholer: Fulton Reek Treating Eliel Dudding/Extender: Tito Dine in Treatment: 6 Clinic Level of Care Assessment Items TOOL 4 Quantity Score []  - Use when only an EandM is performed on FOLLOW-UP visit 0 ASSESSMENTS -  Nursing Assessment / Reassessment []  - Reassessment of Co-morbidities (includes updates in patient status) 0 X- 1 5 Reassessment of Adherence to Treatment Plan ASSESSMENTS - Wound and Skin Assessment / Reassessment []  - Simple Wound Assessment / Reassessment - one wound 0 X- 2 5 Complex Wound Assessment / Reassessment - multiple wounds []  - 0 Dermatologic / Skin Assessment (not related to wound area) ASSESSMENTS - Focused Assessment []  - Circumferential Edema Measurements - multi extremities 0 []  - 0 Nutritional Assessment / Counseling / Intervention []  - 0 Lower Extremity Assessment (monofilament, tuning fork, pulses) []  - 0 Peripheral Arterial Disease Assessment (using hand held doppler) ASSESSMENTS - Ostomy and/or Continence Assessment and Care []  - Incontinence Assessment and Management 0 []  - 0 Ostomy Care Assessment and Management (repouching, etc.) PROCESS - Coordination of Care X - Simple Patient / Family Education for ongoing care 1 15 []  - 0 Complex (extensive) Patient / Family Education for ongoing care X- 1 10 Staff obtains Programmer, systems, Records, Test Results / Process Orders []  - 0 Staff telephones HHA, Nursing Homes / Clarify orders / etc []  - 0 Routine Transfer to another Facility (non-emergent condition) []  - 0 Routine Hospital Admission (non-emergent condition) []  - 0 New Admissions / Biomedical engineer / Ordering NPWT, Apligraf, etc. []  - 0 Emergency Hospital Admission (emergent condition) X- 1 10 Simple Discharge Coordination Ariana [name], STERBENZ. ([id number]) []  - 0 Complex (extensive) Discharge Coordination PROCESS - Special Needs []  - Pediatric / Minor Patient Management 0 []  - 0 Isolation Patient Management []  - 0 Hearing / Language / Visual special needs []  - 0 Assessment of Community assistance (transportation, D/C planning, etc.) []  - 0 Additional assistance / Altered mentation []  - 0 Support Surface(s) Assessment (bed, cushion, seat,  etc.) INTERVENTIONS - Wound Cleansing / Measurement X - Simple Wound Cleansing - one wound 1 5 []  - 0 Complex Wound Cleansing - multiple wounds X- 1 5 Wound Imaging (photographs - any number  of wounds) []  - 0 Wound Tracing (instead of photographs) X- 1 5 Simple Wound Measurement - one wound []  - 0 Complex Wound Measurement - multiple wounds INTERVENTIONS - Wound Dressings []  - Small Wound Dressing one or multiple wounds 0 X- 2 15 Medium Wound Dressing one or multiple wounds []  - 0 Large Wound Dressing one or multiple wounds []  - 0 Application of Medications - topical []  - 0 Application of Medications - injection INTERVENTIONS - Miscellaneous []  - External ear exam 0 []  - 0 Specimen Collection (cultures, biopsies, blood, body fluids, etc.) []  - 0 Specimen(s) / Culture(s) sent or taken to Lab for analysis []  - 0 Patient Transfer (multiple staff / Civil Service fast streamer / Similar devices) []  - 0 Simple Staple / Suture removal (25 or less) []  - 0 Complex Staple / Suture removal (26 or more) []  - 0 Hypo / Hyperglycemic Management (close monitor of Blood Glucose) []  - 0 Ankle / Brachial Index (ABI) - do not check if billed separately X- 1 5 Vital Signs Ariana [name], [name] ([id number]) Has the patient been seen at the hospital within the last three years: Yes Total Score: 100 Level Of Care: New/Established - Level 3 Electronic Signature(s) Signed: 10/27/2017 4:27:21 PM By: Gretta Cool, BSN, RN, CWS, Kim RN, BSN Entered By: Gretta Cool, BSN, RN, CWS, Kim on 10/27/2017 13:00:01 Ariana [name] ([id number]) -------------------------------------------------------------------------------- Encounter Discharge Information Details Patient Name: Ariana [name]. Date of Service: 10/27/2017 12:30 PM Medical Record Number: [id number] Patient Account Number: [id number] Date of Birth/Sex: [birth year] (84 y.o. Female) Treating RN: Cornell Barman Primary Care Paulina Muchmore: Fulton Reek Other  Clinician: Referring Shaelee Forni: Fulton Reek Treating Vanity Larsson/Extender: Tito Dine in Treatment: 6 Encounter Discharge Information Items Discharge Pain Level: 0 Discharge Condition: Stable Ambulatory Status: Ambulatory Discharge Destination: Home Transportation: Private Auto Accompanied By: husband Schedule Follow-up Appointment: Yes Medication Reconciliation completed and Yes provided to Patient/Care Brenlyn Beshara: Patient Clinical Summary of Care: Declined Electronic Signature(s) Signed: 10/27/2017 4:27:21 PM By: Gretta Cool, BSN, RN, CWS, Kim RN, BSN Entered By: Gretta Cool, BSN, RN, CWS, Kim on 10/27/2017 13:00:40 Ariana [name] ([id number]) -------------------------------------------------------------------------------- Lower Extremity Assessment Details Patient Name: Ariana [name] Date of Service: 10/27/2017 12:30 PM Medical Record Number: [id number] Patient Account Number: [id number] Date of Birth/Sex: 1933/05/03 (84 y.o. Female) Treating RN: Cornell Barman Primary Care Tiquan Bouch: Fulton Reek Other Clinician: Referring Negin Hegg: Fulton Reek Treating Linkon Siverson/Extender: Tito Dine in Treatment: 6 Edema Assessment Assessed: [Left: No] [Right: No] Edema: [Left: No] [Right: No] Vascular Assessment Claudication: Claudication Assessment [Left:None] [Right:None] Pulses: Dorsalis Pedis Palpable: [Left:Yes] [Right:Yes] Posterior Tibial Extremity colors, hair growth, and conditions: Extremity Color: [Left:Normal] [Right:Normal] Hair Growth on Extremity: [Left:No] [Right:No] Temperature of Extremity: [Left:Warm] [Right:Warm] Notes Patient asked not to remove socks. Her feet are cold. Electronic Signature(s) Signed: 10/27/2017 4:27:21 PM By: Gretta Cool, BSN, RN, CWS, Kim RN, BSN Entered By: Gretta Cool, BSN, RN, CWS, Kim on 10/27/2017 12:47:27 Ariana [name]  ([id number]) -------------------------------------------------------------------------------- Multi Wound Chart Details Patient Name: Ariana [name] Date of Service: 10/27/2017 12:30 PM Medical Record Number: [id number] Patient Account Number: [id number] Date of Birth/Sex: 01/02/1933 (84 y.o. Female) Treating RN: Cornell Barman Primary Care Cataleyah Colborn: Fulton Reek Other Clinician: Referring Derisha Funderburke: Fulton Reek Treating Ramzi Brathwaite/Extender: Tito Dine in Treatment: 6 Vital Signs Height(in): 65 Pulse(bpm): 42 Weight(lbs): 133.4 Blood Pressure(mmHg): 113/85 Body Mass Index(BMI): 22 Temperature(F): 97.7 Respiratory Rate 16 (breaths/min): Photos: [N/A:N/A] Wound Location: Left Lower Leg - Lateral Right Lower Leg - Medial N/A Wounding Event: Trauma Trauma N/A Primary  Etiology: Trauma, Other Trauma, Other N/A Comorbid History: Cataracts Cataracts N/A Date Acquired: 08/25/2017 08/25/2017 N/A Weeks of Treatment: 6 6 N/A Wound Status: Open Open N/A Measurements L x W x D 3.8x2x0.1 1.5x0.1x0.1 N/A (cm) Area (cm) : 5.969 0.118 N/A Volume (cm) : 0.597 0.012 N/A % Reduction in Area: 50.00% 99.10% N/A % Reduction in Volume: 50.00% 99.10% N/A Classification: Partial Thickness Partial Thickness N/A Exudate Amount: Large Large N/A Exudate Type: Serosanguineous Serosanguineous N/A Exudate Color: red, brown red, brown N/A Wound Margin: Distinct, outline attached Distinct, outline attached N/A Granulation Amount: Large (67-100%) Large (67-100%) N/A Granulation Quality: Red Red N/A Necrotic Amount: Small (1-33%) Small (1-33%) N/A Exposed Structures: Fascia: No Fascia: No N/A Fat Layer (Subcutaneous Fat Layer (Subcutaneous Tissue) Exposed: No Tissue) Exposed: No Tendon: No Tendon: No Muscle: No Muscle: No Joint: No Joint: No Bone: No Bone: No Limited to Skin Breakdown Epithelialization: None None N/A Periwound Skin Texture: N/A Ariana [name], [name]  ([id number]) Scarring: Yes Excoriation: No Excoriation: No Induration: No Induration: No Callus: No Callus: No Crepitus: No Crepitus: No Rash: No Rash: No Scarring: No Periwound Skin Moisture: Maceration: No Maceration: No N/A Dry/Scaly: No Dry/Scaly: No Periwound Skin Color: Atrophie Blanche: No Atrophie Blanche: No N/A Cyanosis: No Cyanosis: No Ecchymosis: No Ecchymosis: No Erythema: No Erythema: No Hemosiderin Staining: No Hemosiderin Staining: No Mottled: No Mottled: No Pallor: No Pallor: No Rubor: No Rubor: No Temperature: No Abnormality No Abnormality N/A Tenderness on Palpation: Yes Yes N/A Wound Preparation: Ulcer Cleansing: Ulcer Cleansing: N/A Rinsed/Irrigated with Saline Rinsed/Irrigated with Saline Topical Anesthetic Applied: Topical Anesthetic Applied: None None Treatment Notes Electronic Signature(s) Signed: 10/27/2017 4:56:29 PM By: Linton Ham MD Entered By: Linton Ham on 10/27/2017 12:55:10 Ariana [name] ([id number]) -------------------------------------------------------------------------------- Multi-Disciplinary Care Plan Details Patient Name: Ariana [name]. Date of Service: 10/27/2017 12:30 PM Medical Record Number: [id number] Patient Account Number: [id number] Date of Birth/Sex: [birth year] (84 y.o. Female) Treating RN: Cornell Barman Primary Care Oscar Forman: Fulton Reek Other Clinician: Referring Stephaie Dardis: Fulton Reek Treating Jayjay Littles/Extender: Tito Dine in Treatment: 6 Active Inactive Electronic Signature(s) Signed: 11/15/2017 3:50:06 PM By: Gretta Cool, BSN, RN, CWS, Kim RN, BSN Previous Signature: 10/27/2017 4:27:21 PM Version By: Gretta Cool, BSN, RN, CWS, Kim RN, BSN Entered By: Gretta Cool, BSN, RN, CWS, Kim on 11/15/2017 15:50:05 Ariana [name] ([id number]) -------------------------------------------------------------------------------- Pain Assessment Details Patient Name: Ariana [name] Date of  Service: 10/27/2017 12:30 PM Medical Record Number: [id number] Patient Account Number: [id number] Date of Birth/Sex: Apr 22, 1933 (84 y.o. Female) Treating RN: Cornell Barman Primary Care Juvencio Verdi: Fulton Reek Other Clinician: Referring Anaika Santillano: Fulton Reek Treating Letonia Stead/Extender: Tito Dine in Treatment: 6 Active Problems Location of Pain Severity and Description of Pain Patient Has Paino No Site Locations With Dressing Change: No Pain Management and Medication Current Pain Management: Goals for Pain Management Topical or injectable lidocaine is offered to patient for acute pain when surgical debridement is performed. If needed, Patient is instructed to use over the counter pain medication for the following 24-48 hours after debridement. Wound care MDs do not prescribed pain medications. Patient has chronic pain or uncontrolled pain. Patient has been instructed to make an appointment with their Primary Care Physician for pain management. Electronic Signature(s) Signed: 10/27/2017 4:27:21 PM By: Gretta Cool, BSN, RN, CWS, Kim RN, BSN Entered By: Gretta Cool, BSN, RN, CWS, Kim on 10/27/2017 12:38:34 Ariana [name] ([id number]) -------------------------------------------------------------------------------- Patient/Caregiver Education Details Patient Name: Ariana [name] Date of Service: 10/27/2017 12:30 PM Medical Record Number: [id number] Patient Account Number: [id number]  Date of Birth/Gender: [birth year] (84 y.o. Female) Treating RN: Cornell Barman Primary Care Physician: Fulton Reek Other Clinician: Referring Physician: Fulton Reek Treating Physician/Extender: Tito Dine in Treatment: 6 Education Assessment Education Provided To: Patient Education Topics Provided Wound/Skin Impairment: Handouts: Caring for Your Ulcer Methods: Demonstration, Explain/Verbal Responses: State content correctly Electronic Signature(s) Signed: 10/27/2017 4:27:21 PM  By: Gretta Cool, BSN, RN, CWS, Kim RN, BSN Entered By: Gretta Cool, BSN, RN, CWS, Kim on 10/27/2017 13:00:50 Ariana [name] ([id number]) -------------------------------------------------------------------------------- Wound Assessment Details Patient Name: Ariana [name] Date of Service: 10/27/2017 12:30 PM Medical Record Number: [id number] Patient Account Number: [id number] Date of Birth/Sex: 1933-04-28 (84 y.o. Female) Treating RN: Cornell Barman Primary Care Bentlie Withem: Fulton Reek Other Clinician: Referring Jasmarie Coppock: Fulton Reek Treating Kamariah Fruchter/Extender: Tito Dine in Treatment: 6 Wound Status Wound Number: 1 Primary Etiology: Trauma, Other Wound Location: Left Lower Leg - Lateral Wound Status: Open Wounding Event: Trauma Comorbid History: Cataracts Date Acquired: 08/25/2017 Weeks Of Treatment: 6 Clustered Wound: No Photos Wound Measurements Length: (cm) 3.8 Width: (cm) 2 Depth: (cm) 0.1 Area: (cm) 5.969 Volume: (cm) 0.597 % Reduction in Area: 50% % Reduction in Volume: 50% Epithelialization: None Tunneling: No Undermining: No Wound Description Classification: Partial Thickness Wound Margin: Distinct, outline attached Exudate Amount: Large Exudate Type: Serosanguineous Exudate Color: red, brown Foul Odor After Cleansing: No Slough/Fibrino Yes Wound Bed Granulation Amount: Large (67-100%) Exposed Structure Granulation Quality: Red Fascia Exposed: No Necrotic Amount: Small (1-33%) Fat Layer (Subcutaneous Tissue) Exposed: No Necrotic Quality: Adherent Slough Tendon Exposed: No Muscle Exposed: No Joint Exposed: No Bone Exposed: No Limited to Skin Breakdown Periwound Skin Texture Texture Color No Abnormalities Noted: No No Abnormalities Noted: No Callus: No Atrophie Blanche: No Ariana [name], JASEK. ([id number]) Crepitus: No Cyanosis: No Excoriation: No Ecchymosis: No Induration: No Erythema: No Rash: No Hemosiderin Staining:  No Scarring: Yes Mottled: No Pallor: No Moisture Rubor: No No Abnormalities Noted: No Dry / Scaly: No Temperature / Pain Maceration: No Temperature: No Abnormality Tenderness on Palpation: Yes Wound Preparation Ulcer Cleansing: Rinsed/Irrigated with Saline Topical Anesthetic Applied: None Electronic Signature(s) Signed: 10/27/2017 4:27:21 PM By: Gretta Cool, BSN, RN, CWS, Kim RN, BSN Entered By: Gretta Cool, BSN, RN, CWS, Kim on 10/27/2017 12:45:44 Ariana [name] ([id number]) -------------------------------------------------------------------------------- Wound Assessment Details Patient Name: Ariana [name]. Date of Service: 10/27/2017 12:30 PM Medical Record Number: [id number] Patient Account Number: [id number] Date of Birth/Sex: March 16, 1933 (84 y.o. Female) Treating RN: Cornell Barman Primary Care Christene Pounds: Fulton Reek Other Clinician: Referring Thatcher Doberstein: Fulton Reek Treating Graciana Sessa/Extender: Tito Dine in Treatment: 6 Wound Status Wound Number: 2 Primary Etiology: Trauma, Other Wound Location: Right Lower Leg - Medial Wound Status: Open Wounding Event: Trauma Comorbid History: Cataracts Date Acquired: 08/25/2017 Weeks Of Treatment: 6 Clustered Wound: No Photos Wound Measurements Length: (cm) 1.5 Width: (cm) 0.1 Depth: (cm) 0.1 Area: (cm) 0.118 Volume: (cm) 0.012 % Reduction in Area: 99.1% % Reduction in Volume: 99.1% Epithelialization: None Tunneling: No Undermining: No Wound Description Classification: Partial Thickness Wound Margin: Distinct, outline attached Exudate Amount: Large Exudate Type: Serosanguineous Exudate Color: red, brown Foul Odor After Cleansing: No Slough/Fibrino Yes Wound Bed Granulation Amount: Large (67-100%) Exposed Structure Granulation Quality: Red Fascia Exposed: No Necrotic Amount: Small (1-33%) Fat Layer (Subcutaneous Tissue) Exposed: No Necrotic Quality: Adherent Slough Tendon Exposed: No Muscle  Exposed: No Joint Exposed: No Bone Exposed: No Periwound Skin Texture Texture Color No Abnormalities Noted: No No Abnormalities Noted: No Callus: No Atrophie Blanche: No Crepitus: No Cyanosis: No Ariana [name], Ariana  E. ([id number]) Excoriation: No Ecchymosis: No Induration: No Erythema: No Rash: No Hemosiderin Staining: No Scarring: No Mottled: No Pallor: No Moisture Rubor: No No Abnormalities Noted: No Dry / Scaly: No Temperature / Pain Maceration: No Temperature: No Abnormality Tenderness on Palpation: Yes Wound Preparation Ulcer Cleansing: Rinsed/Irrigated with Saline Topical Anesthetic Applied: None Electronic Signature(s) Signed: 10/27/2017 4:27:21 PM By: Gretta Cool, BSN, RN, CWS, Kim RN, BSN Entered By: Gretta Cool, BSN, RN, CWS, Kim on 10/27/2017 12:46:21 Ariana [name] ([id number]) -------------------------------------------------------------------------------- Vitals Details Patient Name: Ariana [name]. Date of Service: 10/27/2017 12:30 PM Medical Record Number: [id number] Patient Account Number: [id number] Date of Birth/Sex: [birth year] (84 y.o. Female) Treating RN: Cornell Barman Primary Care June Rode: Fulton Reek Other Clinician: Referring Barbette Mcglaun: Fulton Reek Treating Alicianna Litchford/Extender: Tito Dine in Treatment: 6 Vital Signs Time Taken: 12:38 Temperature (F): 97.7 Height (in): 65 Pulse (bpm): 56 Weight (lbs): 133.4 Respiratory Rate (breaths/min): 16 Body Mass Index (BMI): 22.2 Blood Pressure (mmHg): 113/85 Reference Range: 80 - 120 mg / dl Electronic Signature(s) Signed: 10/27/2017 4:27:21 PM By: Gretta Cool, BSN, RN, CWS, Kim RN, BSN Entered By: Gretta Cool, BSN, RN, CWS, Kim on 10/27/2017 12:39:03

## 2017-10-29 DIAGNOSIS — I10 Essential (primary) hypertension: Secondary | ICD-10-CM | POA: Diagnosis not present

## 2017-10-29 DIAGNOSIS — F0390 Unspecified dementia without behavioral disturbance: Secondary | ICD-10-CM | POA: Diagnosis not present

## 2017-10-29 DIAGNOSIS — I4891 Unspecified atrial fibrillation: Secondary | ICD-10-CM | POA: Diagnosis not present

## 2017-10-29 DIAGNOSIS — S8012XD Contusion of left lower leg, subsequent encounter: Secondary | ICD-10-CM | POA: Diagnosis not present

## 2017-10-29 DIAGNOSIS — J45909 Unspecified asthma, uncomplicated: Secondary | ICD-10-CM | POA: Diagnosis not present

## 2017-10-29 DIAGNOSIS — D693 Immune thrombocytopenic purpura: Secondary | ICD-10-CM | POA: Diagnosis not present

## 2017-10-29 DIAGNOSIS — M5030 Other cervical disc degeneration, unspecified cervical region: Secondary | ICD-10-CM | POA: Diagnosis not present

## 2017-10-29 DIAGNOSIS — S8011XD Contusion of right lower leg, subsequent encounter: Secondary | ICD-10-CM | POA: Diagnosis not present

## 2017-10-29 DIAGNOSIS — M5136 Other intervertebral disc degeneration, lumbar region: Secondary | ICD-10-CM | POA: Diagnosis not present

## 2017-11-01 DIAGNOSIS — S8012XD Contusion of left lower leg, subsequent encounter: Secondary | ICD-10-CM | POA: Diagnosis not present

## 2017-11-01 DIAGNOSIS — I4891 Unspecified atrial fibrillation: Secondary | ICD-10-CM | POA: Diagnosis not present

## 2017-11-01 DIAGNOSIS — F0390 Unspecified dementia without behavioral disturbance: Secondary | ICD-10-CM | POA: Diagnosis not present

## 2017-11-01 DIAGNOSIS — I10 Essential (primary) hypertension: Secondary | ICD-10-CM | POA: Diagnosis not present

## 2017-11-01 DIAGNOSIS — M5030 Other cervical disc degeneration, unspecified cervical region: Secondary | ICD-10-CM | POA: Diagnosis not present

## 2017-11-01 DIAGNOSIS — S8011XD Contusion of right lower leg, subsequent encounter: Secondary | ICD-10-CM | POA: Diagnosis not present

## 2017-11-01 DIAGNOSIS — D693 Immune thrombocytopenic purpura: Secondary | ICD-10-CM | POA: Diagnosis not present

## 2017-11-01 DIAGNOSIS — J45909 Unspecified asthma, uncomplicated: Secondary | ICD-10-CM | POA: Diagnosis not present

## 2017-11-01 DIAGNOSIS — M5136 Other intervertebral disc degeneration, lumbar region: Secondary | ICD-10-CM | POA: Diagnosis not present

## 2017-11-03 ENCOUNTER — Ambulatory Visit: Payer: Medicare HMO | Admitting: Internal Medicine

## 2017-11-05 DIAGNOSIS — J4530 Mild persistent asthma, uncomplicated: Secondary | ICD-10-CM | POA: Diagnosis not present

## 2017-11-05 DIAGNOSIS — J208 Acute bronchitis due to other specified organisms: Secondary | ICD-10-CM | POA: Diagnosis not present

## 2017-11-05 DIAGNOSIS — F0280 Dementia in other diseases classified elsewhere without behavioral disturbance: Secondary | ICD-10-CM | POA: Diagnosis not present

## 2017-11-05 DIAGNOSIS — F0390 Unspecified dementia without behavioral disturbance: Secondary | ICD-10-CM | POA: Diagnosis not present

## 2017-11-05 DIAGNOSIS — J45909 Unspecified asthma, uncomplicated: Secondary | ICD-10-CM | POA: Diagnosis not present

## 2017-11-05 DIAGNOSIS — I4891 Unspecified atrial fibrillation: Secondary | ICD-10-CM | POA: Diagnosis not present

## 2017-11-05 DIAGNOSIS — M5136 Other intervertebral disc degeneration, lumbar region: Secondary | ICD-10-CM | POA: Diagnosis not present

## 2017-11-05 DIAGNOSIS — M5030 Other cervical disc degeneration, unspecified cervical region: Secondary | ICD-10-CM | POA: Diagnosis not present

## 2017-11-05 DIAGNOSIS — S8011XD Contusion of right lower leg, subsequent encounter: Secondary | ICD-10-CM | POA: Diagnosis not present

## 2017-11-05 DIAGNOSIS — G301 Alzheimer's disease with late onset: Secondary | ICD-10-CM | POA: Diagnosis not present

## 2017-11-05 DIAGNOSIS — D693 Immune thrombocytopenic purpura: Secondary | ICD-10-CM | POA: Diagnosis not present

## 2017-11-05 DIAGNOSIS — I10 Essential (primary) hypertension: Secondary | ICD-10-CM | POA: Diagnosis not present

## 2017-11-05 DIAGNOSIS — J40 Bronchitis, not specified as acute or chronic: Secondary | ICD-10-CM | POA: Diagnosis not present

## 2017-11-05 DIAGNOSIS — S8012XD Contusion of left lower leg, subsequent encounter: Secondary | ICD-10-CM | POA: Diagnosis not present

## 2017-11-08 DIAGNOSIS — J9801 Acute bronchospasm: Secondary | ICD-10-CM | POA: Diagnosis not present

## 2017-11-08 DIAGNOSIS — R05 Cough: Secondary | ICD-10-CM | POA: Diagnosis not present

## 2017-11-10 ENCOUNTER — Ambulatory Visit: Payer: Medicare HMO | Admitting: Internal Medicine

## 2017-11-19 NOTE — Progress Notes (Signed)
Ariana [name]  [phone]  [phone]     Ariana [name] DOB: 16-Mar-1933  MR#: [id number]  JAS#:[id number]  Patient Care Team: Idelle Crouch, MD as PCP - General (Internal Medicine)  CHIEF COMPLAINT: Chronic refractory ITP  INTERVAL HISTORY:  Patient returns to clinic today for repeat laboratory work and consideration of additional Nplate. She continues to have increased weakness and fatigue the week leading up to her appointment. She continues to have easy bruising, but denies any bleeding. She has no neurologic complaints. She denies any fevers. She has no chest pain or shortness of breath. She denies any nausea, vomiting, constipation, or diarrhea.  She has no melena or hematochezia.  She has no urinary complaints. Patient offers no further specific complaints today.  REVIEW OF SYSTEMS:   Review of Systems  Constitutional: Positive for malaise/fatigue. Negative for fever and weight loss.  HENT: Negative for congestion.   Respiratory: Negative.  Negative for cough, hemoptysis and shortness of breath.   Cardiovascular: Negative.  Negative for chest pain and leg swelling.  Gastrointestinal: Negative for abdominal pain, blood in stool, diarrhea, melena and nausea.  Genitourinary: Negative.  Negative for dysuria and hematuria.  Musculoskeletal: Negative.   Skin: Negative.  Negative for rash.  Neurological: Positive for weakness. Negative for sensory change.  Endo/Heme/Allergies: Bruises/bleeds easily.  Psychiatric/Behavioral: Negative.  Negative for depression and memory loss. The patient is not nervous/anxious.     As per HPI. Otherwise, a complete review of systems is negative.  PAST MEDICAL HISTORY: Past Medical History:  Diagnosis Date  . Anemia   . Anxiety   . Asthma   . Chronic back pain   . Depression   . Depression   . GERD (gastroesophageal reflux disease)   . Hiatal hernia   . Hypercholesteremia   . ITP (idiopathic  thrombocytopenic purpura)   . Osteoarthritis   . Osteoporosis   . Pulmonary emboli ([place])   . Restless legs     PAST SURGICAL HISTORY: Past Surgical History:  Procedure Laterality Date  . CARPAL TUNNEL RELEASE    . ESOPHAGOGASTRODUODENOSCOPY (EGD) WITH PROPOFOL N/A 09/10/2015   Procedure: ESOPHAGOGASTRODUODENOSCOPY (EGD) WITH PROPOFOL;  Surgeon: Lollie Sails, MD;  Location: [facility] ENDOSCOPY;  Service: Endoscopy;  Laterality: N/A;  . LUMBAR [place]    . SPLENECTOMY, PARTIAL    . TOTAL ABDOMINAL HYSTERECTOMY      FAMILY HISTORY Family History  Problem Relation Age of Onset  . Stroke Mother     GYNECOLOGIC HISTORY:  No LMP recorded. Patient is postmenopausal.     ADVANCED DIRECTIVES:    HEALTH MAINTENANCE: Social History   Tobacco Use  . Smoking status: Never Smoker  . Smokeless tobacco: Never Used  Substance Use Topics  . Alcohol use: No    Alcohol/week: 0.0 oz  . Drug use: No     Allergies  Allergen Reactions  . Aspirin     Other reaction(s): Distress (finding)  . Diazepam     Other reaction(s): Itching of Skin sneezing  . Morphine     Other reaction(s): Itching of Skin  . Other     Other reaction(s): Unknown seasonal allergies  . Tetanus Toxoids     Other reaction(s): Localized superficial swelling of skin    Current Outpatient Medications  Medication Sig Dispense Refill  . acetaminophen (TYLENOL) 325 MG tablet Take 2 tablets (650 mg total) by mouth every 6 (six) hours as needed for mild pain (or Fever >/= 101). 100 tablet  0  . albuterol (PROVENTIL HFA;VENTOLIN HFA) 108 (90 BASE) MCG/ACT inhaler Inhale 2 puffs into the lungs every 6 (six) hours as needed for wheezing or shortness of breath.    . ALPRAZolam (XANAX) 0.5 MG tablet Take 0.5 mg by mouth 3 (three) times daily as needed for anxiety.     Marland Kitchen buPROPion (WELLBUTRIN XL) 150 MG 24 hr tablet Take 150 mg by mouth daily.    . Calcium Carbonate-Vitamin D (CALCIUM 600+D) 600-400 MG-UNIT per tablet  Take 1 tablet by mouth daily.    . citalopram (CELEXA) 40 MG tablet Take 40 mg by mouth daily.    . Cyanocobalamin (RA VITAMIN B-12 TR) 1000 MCG TBCR Take 1,000 mcg by mouth daily.     Marland Kitchen donepezil (ARICEPT) 5 MG tablet Take 10 mg by mouth at bedtime.     . fluticasone (FLONASE) 50 MCG/ACT nasal spray Place into the nose.    Marland Kitchen Fluticasone-Salmeterol (ADVAIR) 250-50 MCG/DOSE AEPB Inhale 1 puff into the lungs daily as needed.     . gabapentin (NEURONTIN) 300 MG capsule Take 300 mg by mouth 4 (four) times daily.    Marland Kitchen HYDROcodone-acetaminophen (NORCO) 7.5-325 MG tablet 1 tablet by mouth q 6 h prn    . levothyroxine (SYNTHROID, LEVOTHROID) 50 MCG tablet Take 50 mcg by mouth daily before breakfast. Take 30 to 60 minutes before breakfast.    . montelukast (SINGULAIR) 10 MG tablet Take by mouth.    . Multiple Vitamins-Minerals (HAIR SKIN AND NAILS FORMULA PO) Take 1 tablet by mouth every evening.    . Multiple Vitamins-Minerals (OCUVITE-LUTEIN PO) Take 1 capsule by mouth daily.     Marland Kitchen oxybutynin (DITROPAN) 5 MG tablet Take 5 mg by mouth 3 (three) times daily.    . pantoprazole (PROTONIX) 20 MG tablet Take 20 mg by mouth 2 (two) times daily.     . phenazopyridine (PYRIDIUM) 200 MG tablet Take 1 tablet (200 mg total) by mouth 3 (three) times daily as needed for pain. 15 tablet 0  . polycarbophil (FIBERCON) 625 MG tablet Take by mouth.    . pravastatin (PRAVACHOL) 20 MG tablet Take 20 mg by mouth daily.    . traMADol (ULTRAM) 50 MG tablet Take 50 mg by mouth 3 (three) times daily as needed for moderate pain or severe pain. pain    . vitamin C (ASCORBIC ACID) 500 MG tablet Take 500 mg by mouth daily.    . vitamin E (E-400) 400 UNIT capsule Take 400 Units by mouth daily.    . memantine (NAMENDA) 5 MG tablet Take 5 mg by mouth 2 (two) times daily.     . prochlorperazine (COMPAZINE) 10 MG tablet Take 1 tablet (10 mg total) by mouth every 6 (six) hours as needed for nausea or vomiting. 30 tablet 2   No current  facility-administered medications for this visit.     OBJECTIVE: BP (!) 144/80 (Patient Position: Sitting)   Pulse 64   Resp 16   Wt 133 lb 12.8 oz (60.7 kg)   BMI 24.47 kg/m    Body mass index is 24.47 kg/m.    ECOG FS:1 - Symptomatic but completely ambulatory  General: Well-developed, well-nourished, no acute distress. Eyes: Pink conjunctiva, anicteric sclera. HEENT: Normocephalic, moist mucous membranes, clear oropharnyx. Lungs: Clear to auscultation bilaterally. Heart: Regular rate and rhythm. No rubs, murmurs, or gallops. Musculoskeletal: No edema, cyanosis, or clubbing. Neuro: Alert, answering all questions appropriately. Cranial nerves grossly intact. Skin: No petechiae noted. Bruising noted in various  stages of healing. Psych: Normal affect.   LAB RESULTS:  Appointment on 11/25/2017  Component Date Value Ref Range Status  . WBC 11/25/2017 9.6  3.6 - 11.0 K/uL Final  . RBC 11/25/2017 4.46  3.80 - 5.20 MIL/uL Final  . Hemoglobin 11/25/2017 12.2  12.0 - 16.0 g/dL Final  . HCT 11/25/2017 38.2  35.0 - 47.0 % Final  . MCV 11/25/2017 85.5  80.0 - 100.0 fL Final  . MCH 11/25/2017 27.2  26.0 - 34.0 pg Final  . MCHC 11/25/2017 31.9* 32.0 - 36.0 g/dL Final  . RDW 11/25/2017 16.6* 11.5 - 14.5 % Final  . Platelets 11/25/2017 40* 150 - 400 K/uL Final   Comment: RESULT REPEATED AND VERIFIED PLATELET COUNT CONFIRMED BY SMEAR GIANT PLATELETS SEEN PLATELETS APPEAR DECREASED SUGGEST COLLECTING CITRATE TUBE FOR PLATELET COUNT   . Neutrophils Relative % 11/25/2017 64  % Final  . Neutro Abs 11/25/2017 6.2  1.4 - 6.5 K/uL Final  . Lymphocytes Relative 11/25/2017 25  % Final  . Lymphs Abs 11/25/2017 2.4  1.0 - 3.6 K/uL Final  . Monocytes Relative 11/25/2017 8  % Final  . Monocytes Absolute 11/25/2017 0.8  0.2 - 0.9 K/uL Final  . Eosinophils Relative 11/25/2017 2  % Final  . Eosinophils Absolute 11/25/2017 0.2  0 - 0.7 K/uL Final  . Basophils Relative 11/25/2017 1  % Final  .  Basophils Absolute 11/25/2017 0.1  0 - 0.1 K/uL Final   Performed at [facility], [address] [place], [place] [address]    STUDIES: No results found.  ASSESSMENT:  Chronic refractory ITP  PLAN:   1. Chronic refractory ITP: Patient had a poor response to Prednisone, WinRho, and Rituxan. She is status post splenectomy in 2007. Her platelet count is less than 100, therefore will proceed with 7 mcg/kg Nplate today. Return to clinic in 4 weeks for laboratory work, further evaluation, and consideration of Nplate if her platelet is below 100. Patient appears to only requiring injections every 4 weeks. Can consider Promacta 12.5mg  at a later date if necessary. Max dose of Nplate is 10 mcg/kg. 2. Abdominal pain: Resolved. CT scan did not reveal any distinct etiology. 3. Constipation: Patient does not complain of this today. 4. Weakness and fatigue: Multifactorial. Although patient states she feels improved after receiving Nplate. 5. Depression/dementia: Monitor. Continue follow-up with primary care.  Approximately 20 minutes spent in discussion of which greater than 50% was consultation.  Patient expressed understanding and was in agreement with this plan. She also understands that She can call clinic at any time with any questions, concerns, or complaints.    Lloyd Huger, MD   11/27/2017 9:02 AM

## 2017-11-25 ENCOUNTER — Inpatient Hospital Stay (HOSPITAL_BASED_OUTPATIENT_CLINIC_OR_DEPARTMENT_OTHER): Payer: Medicare HMO | Admitting: Oncology

## 2017-11-25 ENCOUNTER — Encounter: Payer: Self-pay | Admitting: Oncology

## 2017-11-25 ENCOUNTER — Other Ambulatory Visit: Payer: Medicare HMO

## 2017-11-25 ENCOUNTER — Ambulatory Visit: Payer: Medicare HMO

## 2017-11-25 ENCOUNTER — Inpatient Hospital Stay: Payer: Medicare HMO

## 2017-11-25 ENCOUNTER — Inpatient Hospital Stay: Payer: Medicare HMO | Attending: Oncology

## 2017-11-25 ENCOUNTER — Ambulatory Visit: Payer: Medicare HMO | Admitting: Oncology

## 2017-11-25 VITALS — BP 144/80 | HR 64 | Resp 16 | Wt 133.8 lb

## 2017-11-25 DIAGNOSIS — D693 Immune thrombocytopenic purpura: Secondary | ICD-10-CM

## 2017-11-25 DIAGNOSIS — F329 Major depressive disorder, single episode, unspecified: Secondary | ICD-10-CM | POA: Diagnosis not present

## 2017-11-25 DIAGNOSIS — R531 Weakness: Secondary | ICD-10-CM

## 2017-11-25 DIAGNOSIS — F0390 Unspecified dementia without behavioral disturbance: Secondary | ICD-10-CM | POA: Diagnosis not present

## 2017-11-25 DIAGNOSIS — R5383 Other fatigue: Secondary | ICD-10-CM | POA: Diagnosis not present

## 2017-11-25 LAB — CBC WITH DIFFERENTIAL/PLATELET
Basophils Absolute: 0.1 10*3/uL (ref 0–0.1)
Basophils Relative: 1 %
EOS PCT: 2 %
Eosinophils Absolute: 0.2 10*3/uL (ref 0–0.7)
HCT: 38.2 % (ref 35.0–47.0)
HEMOGLOBIN: 12.2 g/dL (ref 12.0–16.0)
LYMPHS PCT: 25 %
Lymphs Abs: 2.4 10*3/uL (ref 1.0–3.6)
MCH: 27.2 pg (ref 26.0–34.0)
MCHC: 31.9 g/dL — AB (ref 32.0–36.0)
MCV: 85.5 fL (ref 80.0–100.0)
MONOS PCT: 8 %
Monocytes Absolute: 0.8 10*3/uL (ref 0.2–0.9)
Neutro Abs: 6.2 10*3/uL (ref 1.4–6.5)
Neutrophils Relative %: 64 %
Platelets: 40 10*3/uL — ABNORMAL LOW (ref 150–400)
RBC: 4.46 MIL/uL (ref 3.80–5.20)
RDW: 16.6 % — ABNORMAL HIGH (ref 11.5–14.5)
WBC: 9.6 10*3/uL (ref 3.6–11.0)

## 2017-11-25 MED ORDER — PROCHLORPERAZINE MALEATE 10 MG PO TABS: 10.0000 mg | ORAL_TABLET | Freq: Four times a day (QID) | ORAL | 2 refills | Status: DC | PRN

## 2017-11-25 MED ORDER — ROMIPLOSTIM INJECTION 500 MCG
430.0000 ug | Freq: Once | SUBCUTANEOUS | Status: AC
  Administered 2017-11-25: 430 ug via SUBCUTANEOUS
  Filled 2017-11-25: qty 0.86

## 2017-12-13 DIAGNOSIS — E7800 Pure hypercholesterolemia, unspecified: Secondary | ICD-10-CM | POA: Diagnosis not present

## 2017-12-13 DIAGNOSIS — G589 Mononeuropathy, unspecified: Secondary | ICD-10-CM | POA: Diagnosis not present

## 2017-12-13 DIAGNOSIS — E039 Hypothyroidism, unspecified: Secondary | ICD-10-CM | POA: Diagnosis not present

## 2017-12-13 DIAGNOSIS — F419 Anxiety disorder, unspecified: Secondary | ICD-10-CM | POA: Diagnosis not present

## 2017-12-13 DIAGNOSIS — F329 Major depressive disorder, single episode, unspecified: Secondary | ICD-10-CM | POA: Diagnosis not present

## 2017-12-13 DIAGNOSIS — Z79899 Other long term (current) drug therapy: Secondary | ICD-10-CM | POA: Diagnosis not present

## 2017-12-13 DIAGNOSIS — I10 Essential (primary) hypertension: Secondary | ICD-10-CM | POA: Diagnosis not present

## 2017-12-13 DIAGNOSIS — G301 Alzheimer's disease with late onset: Secondary | ICD-10-CM | POA: Diagnosis not present

## 2017-12-13 DIAGNOSIS — F0280 Dementia in other diseases classified elsewhere without behavioral disturbance: Secondary | ICD-10-CM | POA: Diagnosis not present

## 2017-12-19 NOTE — Progress Notes (Signed)
[facility]  [phone]) [phone]  Fax:(336) [phone]     Ariana [name] DOB: [birth year]  MR#: [id number]  TIW#:[id number]  Patient Care Team: Idelle Crouch, MD as PCP - General (Internal Medicine)  CHIEF COMPLAINT: Chronic refractory ITP  INTERVAL HISTORY:  Patient returns to clinic today for repeat laboratory work and consideration of additional Nplate. She continues to have increased weakness and fatigue the week leading up to her appointment. She continues to have easy bruising, but denies any bleeding. She has no neurologic complaints. She denies any fevers. She has no chest pain or shortness of breath. She denies any nausea, vomiting, constipation, or diarrhea.  She has no melena or hematochezia.  She has no urinary complaints. Patient offers no further specific complaints today.  REVIEW OF SYSTEMS:   Review of Systems  Constitutional: Positive for malaise/fatigue. Negative for fever and weight loss.  HENT: Negative for congestion.   Respiratory: Negative.  Negative for cough, hemoptysis and shortness of breath.   Cardiovascular: Negative.  Negative for chest pain and leg swelling.  Gastrointestinal: Negative for abdominal pain, blood in stool, diarrhea, melena and nausea.  Genitourinary: Negative.  Negative for dysuria and hematuria.  Musculoskeletal: Negative.   Skin: Negative.  Negative for rash.  Neurological: Positive for weakness. Negative for sensory change.  Endo/Heme/Allergies: Bruises/bleeds easily.  Psychiatric/Behavioral: Negative.  Negative for depression and memory loss. The patient is not nervous/anxious.     As per HPI. Otherwise, a complete review of systems is negative.  PAST MEDICAL HISTORY: Past Medical History:  Diagnosis Date  . Anemia   . Anxiety   . Asthma   . Chronic back pain   . Depression   . Depression   . GERD (gastroesophageal reflux disease)   . Hiatal hernia   . Hypercholesteremia   . ITP (idiopathic  thrombocytopenic purpura)   . Osteoarthritis   . Osteoporosis   . Pulmonary emboli ([place])   . Restless legs     PAST SURGICAL HISTORY: Past Surgical History:  Procedure Laterality Date  . CARPAL TUNNEL RELEASE    . ESOPHAGOGASTRODUODENOSCOPY (EGD) WITH PROPOFOL N/A 09/10/2015   Procedure: ESOPHAGOGASTRODUODENOSCOPY (EGD) WITH PROPOFOL;  Surgeon: Lollie Sails, MD;  Location: [facility] ENDOSCOPY;  Service: Endoscopy;  Laterality: N/A;  . LUMBAR [place]    . SPLENECTOMY, PARTIAL    . TOTAL ABDOMINAL HYSTERECTOMY      FAMILY HISTORY Family History  Problem Relation Age of Onset  . Stroke Mother     GYNECOLOGIC HISTORY:  No LMP recorded. Patient is postmenopausal.     ADVANCED DIRECTIVES:    HEALTH MAINTENANCE: Social History   Tobacco Use  . Smoking status: Never Smoker  . Smokeless tobacco: Never Used  Substance Use Topics  . Alcohol use: No    Alcohol/week: 0.0 oz  . Drug use: No     Allergies  Allergen Reactions  . Aspirin     Other reaction(s): Distress (finding)  . Diazepam     Other reaction(s): Itching of Skin sneezing  . Morphine     Other reaction(s): Itching of Skin  . Other     Other reaction(s): Unknown seasonal allergies  . Tetanus Toxoids     Other reaction(s): Localized superficial swelling of skin    Current Outpatient Medications  Medication Sig Dispense Refill  . albuterol (PROVENTIL HFA;VENTOLIN HFA) 108 (90 BASE) MCG/ACT inhaler Inhale 2 puffs into the lungs every 6 (six) hours as needed for wheezing or shortness of breath.    Marland Kitchen  ALPRAZolam (XANAX) 0.5 MG tablet Take 0.5 mg by mouth 3 (three) times daily as needed for anxiety.     Marland Kitchen buPROPion (WELLBUTRIN XL) 150 MG 24 hr tablet Take 150 mg by mouth daily.    . Calcium Carbonate-Vitamin D (CALCIUM 600+D) 600-400 MG-UNIT per tablet Take 1 tablet by mouth daily.    . citalopram (CELEXA) 40 MG tablet Take 40 mg by mouth daily.    . Cyanocobalamin (RA VITAMIN B-12 TR) 1000 MCG TBCR Take  1,000 mcg by mouth daily.     Marland Kitchen donepezil (ARICEPT) 5 MG tablet Take 10 mg by mouth at bedtime.     . fluticasone (FLONASE) 50 MCG/ACT nasal spray Place into the nose.    Marland Kitchen Fluticasone-Salmeterol (ADVAIR) 250-50 MCG/DOSE AEPB Inhale 1 puff into the lungs daily as needed.     . gabapentin (NEURONTIN) 300 MG capsule Take 300 mg by mouth 4 (four) times daily.    Marland Kitchen HYDROcodone-acetaminophen (NORCO) 7.5-325 MG tablet 1 tablet by mouth q 6 h prn    . levothyroxine (SYNTHROID, LEVOTHROID) 50 MCG tablet Take 50 mcg by mouth daily before breakfast. Take 30 to 60 minutes before breakfast.    . montelukast (SINGULAIR) 10 MG tablet Take by mouth.    . Multiple Vitamins-Minerals (HAIR SKIN AND NAILS FORMULA PO) Take 1 tablet by mouth every evening.    Marland Kitchen oxybutynin (DITROPAN) 5 MG tablet Take 5 mg by mouth 3 (three) times daily.    . pantoprazole (PROTONIX) 20 MG tablet Take 20 mg by mouth 2 (two) times daily.     . phenazopyridine (PYRIDIUM) 200 MG tablet Take 1 tablet (200 mg total) by mouth 3 (three) times daily as needed for pain. 15 tablet 0  . polycarbophil (FIBERCON) 625 MG tablet Take by mouth.    . pravastatin (PRAVACHOL) 20 MG tablet Take 20 mg by mouth daily.    . prochlorperazine (COMPAZINE) 10 MG tablet Take 1 tablet (10 mg total) by mouth every 6 (six) hours as needed for nausea or vomiting. 30 tablet 2  . traMADol (ULTRAM) 50 MG tablet Take 50 mg by mouth 3 (three) times daily as needed for moderate pain or severe pain. pain    . vitamin C (ASCORBIC ACID) 500 MG tablet Take 500 mg by mouth daily.    . vitamin E (E-400) 400 UNIT capsule Take 400 Units by mouth daily.    Marland Kitchen acetaminophen (TYLENOL) 325 MG tablet Take 2 tablets (650 mg total) by mouth every 6 (six) hours as needed for mild pain (or Fever >/= 101). (Patient not taking: Reported on 12/23/2017) 100 tablet 0  . memantine (NAMENDA) 5 MG tablet Take 5 mg by mouth 2 (two) times daily.     . Multiple Vitamins-Minerals (OCUVITE-LUTEIN PO) Take  1 capsule by mouth daily.      No current facility-administered medications for this visit.     OBJECTIVE: BP 136/76 (BP Location: Left Arm, Patient Position: Sitting)   Pulse (!) 58   Temp 97.9 F (36.6 C) (Tympanic)   Resp 18   Ht 5\' 2"  (1.575 m)   Wt 138 lb 12.8 oz (63 kg)   BMI 25.39 kg/m    Body mass index is 25.39 kg/m.    ECOG FS:1 - Symptomatic but completely ambulatory  General: Well-developed, well-nourished, no acute distress. Eyes: Pink conjunctiva, anicteric sclera. HEENT: Normocephalic, moist mucous membranes, clear oropharnyx. Lungs: Clear to auscultation bilaterally. Heart: Regular rate and rhythm. No rubs, murmurs, or gallops. Musculoskeletal: No  edema, cyanosis, or clubbing. Neuro: Alert, answering all questions appropriately. Cranial nerves grossly intact. Skin: No petechiae noted. Bruising noted in various stages of healing. Psych: Normal affect.   LAB RESULTS:  Appointment on 12/23/2017  Component Date Value Ref Range Status  . WBC 12/23/2017 12.8* 3.6 - 11.0 K/uL Final  . RBC 12/23/2017 4.17  3.80 - 5.20 MIL/uL Final  . Hemoglobin 12/23/2017 11.4* 12.0 - 16.0 g/dL Final  . HCT 12/23/2017 35.0  35.0 - 47.0 % Final  . MCV 12/23/2017 83.8  80.0 - 100.0 fL Final  . MCH 12/23/2017 27.3  26.0 - 34.0 pg Final  . MCHC 12/23/2017 32.5  32.0 - 36.0 g/dL Final  . RDW 12/23/2017 16.7* 11.5 - 14.5 % Final  . Platelets 12/23/2017 45* 150 - 400 K/uL Final   Comment: RESULT REPEATED AND VERIFIED PLATELET COUNT CONFIRMED BY SMEAR GIANT PLATELETS SEEN PLATELETS VARY IN SIZE   . Neutrophils Relative % 12/23/2017 68  % Final  . Neutro Abs 12/23/2017 8.7* 1.4 - 6.5 K/uL Final  . Lymphocytes Relative 12/23/2017 24  % Final  . Lymphs Abs 12/23/2017 3.0  1.0 - 3.6 K/uL Final  . Monocytes Relative 12/23/2017 7  % Final  . Monocytes Absolute 12/23/2017 0.9  0.2 - 0.9 K/uL Final  . Eosinophils Relative 12/23/2017 0  % Final  . Eosinophils Absolute 12/23/2017 0.1  0 -  0.7 K/uL Final  . Basophils Relative 12/23/2017 1  % Final  . Basophils Absolute 12/23/2017 0.1  0 - 0.1 K/uL Final   Performed at [facility], [address] [place], [place] [address]    STUDIES: No results found.  ASSESSMENT:  Chronic refractory ITP  PLAN:   1. Chronic refractory ITP: Patient had a poor response to Prednisone, WinRho, and Rituxan. She is status post splenectomy in 2007. Her platelet count is less than 100, therefore will proceed with 7 mcg/kg Nplate today. Return to clinic in 4 weeks for laboratory work, further evaluation, and consideration of Nplate if her platelet is below 100. Patient appears to only requiring injections every 4 weeks. Can consider Promacta 12.5mg  at a later date if necessary. Max dose of Nplate is 10 mcg/kg. 2. Abdominal pain: Resolved. CT scan did not reveal any distinct etiology. 3. Constipation: Patient does not complain of this today. 4. Weakness and fatigue: Multifactorial. Although patient states she feels improved after receiving Nplate. 5. Depression/dementia: Monitor. Continue follow-up with primary care.  Approximately 20 minutes spent in discussion of which greater than 50% was consultation.  Patient expressed understanding and was in agreement with this plan. She also understands that She can call clinic at any time with any questions, concerns, or complaints.    Lloyd Huger, MD   12/26/2017 8:15 AM

## 2017-12-22 DIAGNOSIS — J4520 Mild intermittent asthma, uncomplicated: Secondary | ICD-10-CM | POA: Diagnosis not present

## 2017-12-22 DIAGNOSIS — R0609 Other forms of dyspnea: Secondary | ICD-10-CM | POA: Diagnosis not present

## 2017-12-22 DIAGNOSIS — R05 Cough: Secondary | ICD-10-CM | POA: Diagnosis not present

## 2017-12-23 ENCOUNTER — Encounter: Payer: Self-pay | Admitting: Oncology

## 2017-12-23 ENCOUNTER — Inpatient Hospital Stay: Payer: Medicare HMO | Attending: Oncology

## 2017-12-23 ENCOUNTER — Inpatient Hospital Stay: Payer: Medicare HMO

## 2017-12-23 ENCOUNTER — Inpatient Hospital Stay (HOSPITAL_BASED_OUTPATIENT_CLINIC_OR_DEPARTMENT_OTHER): Payer: Medicare HMO | Admitting: Oncology

## 2017-12-23 VITALS — BP 136/76 | HR 58 | Temp 97.9°F | Resp 18 | Ht 62.0 in | Wt 138.8 lb

## 2017-12-23 DIAGNOSIS — D693 Immune thrombocytopenic purpura: Secondary | ICD-10-CM

## 2017-12-23 DIAGNOSIS — R531 Weakness: Secondary | ICD-10-CM | POA: Diagnosis not present

## 2017-12-23 DIAGNOSIS — F0390 Unspecified dementia without behavioral disturbance: Secondary | ICD-10-CM

## 2017-12-23 DIAGNOSIS — R5383 Other fatigue: Secondary | ICD-10-CM

## 2017-12-23 DIAGNOSIS — F329 Major depressive disorder, single episode, unspecified: Secondary | ICD-10-CM | POA: Diagnosis not present

## 2017-12-23 LAB — CBC WITH DIFFERENTIAL/PLATELET
BASOS PCT: 1 %
Basophils Absolute: 0.1 10*3/uL (ref 0–0.1)
EOS ABS: 0.1 10*3/uL (ref 0–0.7)
Eosinophils Relative: 0 %
HEMATOCRIT: 35 % (ref 35.0–47.0)
HEMOGLOBIN: 11.4 g/dL — AB (ref 12.0–16.0)
LYMPHS ABS: 3 10*3/uL (ref 1.0–3.6)
Lymphocytes Relative: 24 %
MCH: 27.3 pg (ref 26.0–34.0)
MCHC: 32.5 g/dL (ref 32.0–36.0)
MCV: 83.8 fL (ref 80.0–100.0)
Monocytes Absolute: 0.9 10*3/uL (ref 0.2–0.9)
Monocytes Relative: 7 %
NEUTROS ABS: 8.7 10*3/uL — AB (ref 1.4–6.5)
NEUTROS PCT: 68 %
Platelets: 45 10*3/uL — ABNORMAL LOW (ref 150–400)
RBC: 4.17 MIL/uL (ref 3.80–5.20)
RDW: 16.7 % — ABNORMAL HIGH (ref 11.5–14.5)
WBC: 12.8 10*3/uL — AB (ref 3.6–11.0)

## 2017-12-23 MED ORDER — ROMIPLOSTIM INJECTION 500 MCG
7.0000 ug/kg | Freq: Once | SUBCUTANEOUS | Status: AC
  Administered 2017-12-23: 440 ug via SUBCUTANEOUS
  Filled 2017-12-23: qty 0.88

## 2017-12-23 NOTE — Progress Notes (Signed)
No new changes noted today 

## 2018-01-05 ENCOUNTER — Encounter: Payer: Medicare HMO | Attending: Internal Medicine | Admitting: Internal Medicine

## 2018-01-05 DIAGNOSIS — I872 Venous insufficiency (chronic) (peripheral): Secondary | ICD-10-CM | POA: Insufficient documentation

## 2018-01-05 DIAGNOSIS — I96 Gangrene, not elsewhere classified: Secondary | ICD-10-CM | POA: Insufficient documentation

## 2018-01-05 DIAGNOSIS — D693 Immune thrombocytopenic purpura: Secondary | ICD-10-CM | POA: Insufficient documentation

## 2018-01-05 DIAGNOSIS — E1152 Type 2 diabetes mellitus with diabetic peripheral angiopathy with gangrene: Secondary | ICD-10-CM | POA: Diagnosis not present

## 2018-01-05 DIAGNOSIS — Z887 Allergy status to serum and vaccine status: Secondary | ICD-10-CM | POA: Insufficient documentation

## 2018-01-05 DIAGNOSIS — Z886 Allergy status to analgesic agent status: Secondary | ICD-10-CM | POA: Diagnosis not present

## 2018-01-05 DIAGNOSIS — W08XXXA Fall from other furniture, initial encounter: Secondary | ICD-10-CM | POA: Diagnosis not present

## 2018-01-05 DIAGNOSIS — Y998 Other external cause status: Secondary | ICD-10-CM | POA: Insufficient documentation

## 2018-01-05 DIAGNOSIS — M1990 Unspecified osteoarthritis, unspecified site: Secondary | ICD-10-CM | POA: Insufficient documentation

## 2018-01-05 DIAGNOSIS — S81812A Laceration without foreign body, left lower leg, initial encounter: Secondary | ICD-10-CM | POA: Diagnosis not present

## 2018-01-05 DIAGNOSIS — Y92000 Kitchen of unspecified non-institutional (private) residence as  the place of occurrence of the external cause: Secondary | ICD-10-CM | POA: Diagnosis not present

## 2018-01-05 DIAGNOSIS — I87312 Chronic venous hypertension (idiopathic) with ulcer of left lower extremity: Secondary | ICD-10-CM | POA: Diagnosis not present

## 2018-01-05 DIAGNOSIS — Z885 Allergy status to narcotic agent status: Secondary | ICD-10-CM | POA: Insufficient documentation

## 2018-01-05 DIAGNOSIS — S0003XA Contusion of scalp, initial encounter: Secondary | ICD-10-CM | POA: Diagnosis not present

## 2018-01-05 DIAGNOSIS — Z888 Allergy status to other drugs, medicaments and biological substances status: Secondary | ICD-10-CM | POA: Diagnosis not present

## 2018-01-05 DIAGNOSIS — L97221 Non-pressure chronic ulcer of left calf limited to breakdown of skin: Secondary | ICD-10-CM | POA: Diagnosis not present

## 2018-01-05 DIAGNOSIS — Z9081 Acquired absence of spleen: Secondary | ICD-10-CM | POA: Insufficient documentation

## 2018-01-05 DIAGNOSIS — Y93E9 Activity, other interior property and clothing maintenance: Secondary | ICD-10-CM | POA: Insufficient documentation

## 2018-01-05 DIAGNOSIS — S81802A Unspecified open wound, left lower leg, initial encounter: Secondary | ICD-10-CM | POA: Diagnosis not present

## 2018-01-06 NOTE — Progress Notes (Signed)
MIAROSE, LIPPERT ([id number]) Visit Report for 01/05/2018 Abuse/Suicide Risk Screen Details Patient Name: Ariana [name], Ariana [name]. Date of Service: 01/05/2018 9:45 AM Medical Record Number: [id number] Patient Account Number: [id number] Date of Birth/Sex: 1933/05/11 (84 y.o. Female) Treating RN: Ahmed Prima Primary Care Analeigh Aries: Fulton Reek Other Clinician: Referring Julina Altmann: Referral, Self Treating Lucille Witts/Extender: Tito Dine in Treatment: 0 Abuse/Suicide Risk Screen Items Answer ABUSE/SUICIDE RISK SCREEN: Has anyone close to you tried to hurt or harm you recentlyo No Do you feel uncomfortable with anyone in your familyo No Has anyone forced you do things that you didnot want to doo No Do you have any thoughts of harming yourselfo No Patient displays signs or symptoms of abuse and/or neglect. No Electronic Signature(s) Signed: 01/05/2018 4:37:47 PM By: Alric Quan Entered By: Alric Quan on 01/05/2018 09:56:37 Ariana [name] ([id number]) -------------------------------------------------------------------------------- Activities of Daily Living Details Patient Name: Ariana [name]. Date of Service: 01/05/2018 9:45 AM Medical Record Number: [id number] Patient Account Number: [id number] Date of Birth/Sex: Feb 25, 1933 (84 y.o. Female) Treating RN: Ahmed Prima Primary Care Geralene Afshar: Fulton Reek Other Clinician: Referring Skylor Schnapp: Referral, Self Treating Bhavik Cabiness/Extender: Tito Dine in Treatment: 0 Activities of Daily Living Items Answer Activities of Daily Living (Please select one for each item) Drive Automobile Not Able Take Medications Need Assistance Use Telephone Completely Able Care for Appearance Completely Able Use Toilet Completely Able Bath / Shower Completely Able Dress Self Completely Able Feed Self Completely Able Walk Completely Able Get In / Out Bed Completely [place] Need Assistance Shop for Self Need Assistance Electronic Signature(s) Signed: 01/05/2018 4:37:47 PM By: Alric Quan Entered By: Alric Quan on 01/05/2018 09:57:29 Ariana [name] ([id number]) -------------------------------------------------------------------------------- Education Assessment Details Patient Name: Ariana [name] Date of Service: 01/05/2018 9:45 AM Medical Record Number: [id number] Patient Account Number: [id number] Date of Birth/Sex: 10/16/1932 (84 y.o. Female) Treating RN: Ahmed Prima Primary Care Edia Pursifull: Fulton Reek Other Clinician: Referring Cuyler Vandyken: Referral, Self Treating Esvin Hnat/Extender: Tito Dine in Treatment: 0 Primary Learner Assessed: Patient Learning Preferences/Education Level/Primary Language Learning Preference: Explanation, Printed Material Highest Education Level: High School Preferred Language: English Cognitive Barrier Assessment/Beliefs Language Barrier: No Translator Needed: No Memory Deficit: No Emotional Barrier: No Cultural/Religious Beliefs Affecting Medical Care: No Physical Barrier Assessment Impaired Vision: Yes Glasses Impaired Hearing: Yes HOH Decreased Hand dexterity: No Knowledge/Comprehension Assessment Knowledge Level: Medium Comprehension Level: Medium Ability to understand written Medium instructions: Ability to understand verbal Medium instructions: Motivation Assessment Anxiety Level: Calm Cooperation: Cooperative Education Importance: Acknowledges Need Interest in Health Problems: Asks Questions Perception: Coherent Willingness to Engage in Self- Medium Management Activities: Readiness to Engage in Self- Medium Management Activities: Electronic Signature(s) Signed: 01/05/2018 4:37:47 PM By: Alric Quan Entered By: Alric Quan on 01/05/2018 09:57:57 Ariana [name]  ([id number]) -------------------------------------------------------------------------------- Fall Risk Assessment Details Patient Name: Ariana [name] Date of Service: 01/05/2018 9:45 AM Medical Record Number: [id number] Patient Account Number: [id number] Date of Birth/Sex: 10-29-1932 (84 y.o. Female) Treating RN: Carolyne Fiscal, Debi Primary Care Shamera Yarberry: Fulton Reek Other Clinician: Referring Jamarius Saha: Referral, Self Treating Syerra Abdelrahman/Extender: Tito Dine in Treatment: 0 Fall Risk Assessment Items Have you had 2 or more falls in the last 12 monthso 0 Yes Have you had any fall that resulted in injury in the last 12 monthso 0 Yes FALL RISK ASSESSMENT: History of falling - immediate or within 3 months 25 Yes Secondary diagnosis 0 No Ambulatory aid None/bed rest/wheelchair/nurse 0 No Crutches/cane/walker 0  No Furniture 0 No IV Access/Saline Lock 0 No Gait/Training Normal/bed rest/immobile 0 No Weak 0 No Impaired 0 No Mental Status Oriented to own ability 0 Yes Electronic Signature(s) Signed: 01/05/2018 4:37:47 PM By: Alric Quan Entered By: Alric Quan on 01/05/2018 09:58:33 Ariana [name] ([id number]) -------------------------------------------------------------------------------- Foot Assessment Details Patient Name: Ariana [name]. Date of Service: 01/05/2018 9:45 AM Medical Record Number: [id number] Patient Account Number: [id number] Date of Birth/Sex: Jan 06, 1933 (84 y.o. Female) Treating RN: Ahmed Prima Primary Care Nygel Prokop: Fulton Reek Other Clinician: Referring Cloyde Oregel: Referral, Self Treating Armina Galloway/Extender: Tito Dine in Treatment: 0 Foot Assessment Items Site Locations + = Sensation present, - = Sensation absent, C = Callus, U = Ulcer R = Redness, W = Warmth, M = Maceration, PU = Pre-ulcerative lesion F = Fissure, S = Swelling, D = Dryness Assessment Right: Left: Other Deformity: No No Prior Foot  Ulcer: No No Prior Amputation: No No Charcot Joint: No No Ambulatory Status: Ambulatory Without Help Gait: Steady Electronic Signature(s) Signed: 01/05/2018 4:37:47 PM By: Alric Quan Entered By: Alric Quan on 01/05/2018 10:01:56 Ariana [name] ([id number]) -------------------------------------------------------------------------------- Nutrition Risk Assessment Details Patient Name: Ariana [name]. Date of Service: 01/05/2018 9:45 AM Medical Record Number: [id number] Patient Account Number: [id number] Date of Birth/Sex: October 30, 1932 (84 y.o. Female) Treating RN: Ahmed Prima Primary Care Mirinda Monte: Fulton Reek Other Clinician: Referring Karlye Ihrig: Referral, Self Treating Wenonah Milo/Extender: Ricard Dillon Weeks in Treatment: 0 Height (in): 66 Weight (lbs): 141.1 Body Mass Index (BMI): 22.8 Nutrition Risk Assessment Items NUTRITION RISK SCREEN: I have an illness or condition that made me change the kind and/or amount of 0 No food I eat I eat fewer than two meals per day 3 Yes I eat few fruits and vegetables, or milk products 0 No I have three or more drinks of beer, liquor or wine almost every day 0 No I have tooth or mouth problems that make it hard for me to eat 0 No I don't always have enough money to buy the food I need 0 No I eat alone most of the time 0 No I take three or more different prescribed or over-the-counter drugs a day 0 No Without wanting to, I have lost or gained 10 pounds in the last six months 0 No I am not always physically able to shop, cook and/or feed myself 2 Yes Nutrition Protocols Good Risk Protocol Moderate Risk Protocol Electronic Signature(s) Signed: 01/05/2018 4:37:47 PM By: Alric Quan Entered By: Alric Quan on 01/05/2018 09:58:51

## 2018-01-07 NOTE — Progress Notes (Signed)
ALYRIA, KRACK ([id number]) Visit Report for 01/05/2018 Debridement Details Patient Name: Ariana [name], Ariana [name]. Date of Service: 01/05/2018 9:45 AM Medical Record Number: [id number] Patient Account Number: [id number] Date of Birth/Sex: 1933-02-04 (84 y.o. Female) Treating RN: Cornell Barman Primary Care Provider: Fulton Reek Other Clinician: Referring Provider: Referral, Self Treating Provider/Extender: Tito Dine in Treatment: 0 Debridement Performed for Wound #4 Left,Anterior Lower Leg Assessment: Performed By: Physician Ricard Dillon, MD Debridement Type: Debridement Pre-procedure Verification/Time Yes - 10:18 Out Taken: Start Time: 10:18 Pain Control: Lidocaine 4% Topical Solution Total Area Debrided (L x W): 2 (cm) x 3.5 (cm) = 7 (cm) Tissue and other material Viable, Non-Viable, Subcutaneous, Fibrin/Exudate debrided: Level: Skin/Subcutaneous Tissue Debridement Description: Excisional Instrument: Curette Bleeding: Moderate Hemostasis Achieved: Pressure End Time: 10:19 Procedural Pain: 3 Post Procedural Pain: 2 Response to Treatment: Procedure was tolerated well Post Debridement Measurements of Total Wound Length: (cm) 2 Width: (cm) 3.5 Depth: (cm) 0.2 Volume: (cm) 1.1 Character of Wound/Ulcer Post Debridement: Stable Post Procedure Diagnosis Same as Pre-procedure Electronic Signature(s) Signed: 01/05/2018 5:06:18 PM By: Gretta Cool, BSN, RN, CWS, Kim RN, BSN Signed: 01/06/2018 8:15:41 AM By: Linton Ham MD Entered By: Linton Ham on 01/05/2018 10:56:59 Ariana [name] ([id number]) -------------------------------------------------------------------------------- HPI Details Patient Name: Ariana [name] Date of Service: 01/05/2018 9:45 AM Medical Record Number: [id number] Patient Account Number: [id number] Date of Birth/Sex: 07-07-1933 (84 y.o. Female) Treating RN: Cornell Barman Primary Care Provider: Fulton Reek Other  Clinician: Referring Provider: Referral, Self Treating Provider/Extender: Tito Dine in Treatment: 0 History of Present Illness HPI Description: 09/15/17; this is an 84 year old woman with a history of type 2 diabetes, chronic ITP for which she follows with oncology. She had a splenectomy in 2007. 3 weeks ago well sitting in the bathroom she fell off the toilet and hit her legs on the bathtub. She had 2 large hematomas. The area on the left has ruptured into a wound where the area on the right medial lower leg is actually still intact. She was given Keflex of concern for coexistent cellulitis on 11/28 the area on the right is still intact and she is not doing anything specific for that. She has a history of small hematomas related to thrombocytopenia on her lower legs although these all seem to have primarily healed. She has a history of falls 09/22/17; this is a patient who had traumatic hematomas on both her legs. When we admitted to clinic last week the left one had already evacuated whereas the right one had not. She arrived in clinic today with drainage out of the right side. We used Hydrofera Blue to the left 09/29/17; this is a patient who had a traumatic hematoma on both her legs. She has chronic ITP and states she bruises very easily. The right leg hematoma evacuated last week. We have been using Hydrofera Blue to both wound areas on both anterior legs. Today the one on the left is slightly hyper-granulated if the wound stalls we may need to address this. Otherwise the wounds are smaller and look healthy today 10/06/17; traumatic hematomas on both her legs left greater than right. She has chronic venous insufficiency also chronic ITP. We have been using Hydrofera Blue to both of these wound areas. She has new wounds on the right lateral leg caused by her dog 10/20/17; traumatic hematomas on both her legs left greater than right which is left her with chronic bilateral wounds.  She has chronic venous insufficiency also chronic ITP. We've been  using Hydrofera Blue to both of these wound areas largely because of hyperventilation. The right lateral leg wound was caused last visit by her dog. The left leg wound was caused by trauma against her bathtub. 10/27/17; these were initially traumatic hematomas on both her legs left greater than right. She hyper-granulation last week and I used silver nitrate. We've been using Hydrofera Blue to the wounds. Much better today especially on the left READMISSION 01/05/18; this is a patient I cared for late in 2018 and into 2019. She had traumatic hematomas on both anterior legs in the setting of chronic ITP. She has had a splenectomy.I have her listed as a type II diabetic although that may be an error. She denies this. In any case she states the wounds from last time" on their own and indeed both the areas seem to appeal today. The current problem started from 12/31/17; she was cleaning on top of her refrigerator standing on some form of stool and she fell backwards. She hit her head and has a laceration type injury on the left lateral leg. She has chronic venous insufficiency but no major edema. She has stockings but does not use them. They've been using topical antibiotics Electronic Signature(s) Signed: 01/06/2018 8:15:41 AM By: Linton Ham MD Entered By: Linton Ham on 01/05/2018 11:00:00 Ariana [name] ([id number]) -------------------------------------------------------------------------------- Physical Exam Details Patient Name: Ariana [name] Date of Service: 01/05/2018 9:45 AM Medical Record Number: [id number] Patient Account Number: [id number] Date of Birth/Sex: [birth year] (84 y.o. Female) Treating RN: Cornell Barman Primary Care Provider: Fulton Reek Other Clinician: Referring Provider: Referral, Self Treating Provider/Extender: Ricard Dillon Weeks in Treatment: 0 Constitutional Sitting or standing  Blood Pressure is within target range for patient.. Pulse regular and within target range for patient.Marland Ariana [name] Respirations regular, non-labored and within target range.. Temperature is normal and within the target range for the patient.Marland Ariana [name] appears in no distress.. Eyes Conjunctivae clear. No discharge. Respiratory Respiratory effort is easy and symmetric bilaterally. Rate is normal at rest and on room air.. Cardiovascular Femoral arteries without bruits and pulses strong.. Pedal pulses palpable and strong on the left. Lymphatic and palpable in the popliteal or inguinal area. Integumentary (Hair, Skin) she has changes of chronic venous insufficiency in her legs with marked hemosiderin deposition and skin damage. No other cutaneous issues seen. Psychiatric No evidence of depression, anxiety, or agitation. Calm, cooperative, and communicative. Appropriate interactions and affect.. Notes wound exam; the area in question is on the left anterior shin area. This looks like a skin tear related to trauma. The area is linear covered in a necrotic surface eschar. Laterally into a more confluent area also covered and eschar. Using a #5 curet the eschar subcutaneous tissue was gently debrided. Hemostasis with direct pressure. Most of the skin over this looks healthy I don't think were going to have an issue here with further skin deterioration. oThe patient has a small hematoma on her scalp but nothing here looks major. No serious injuries. Electronic Signature(s) Signed: 01/06/2018 8:15:41 AM By: Linton Ham MD Entered By: Linton Ham on 01/05/2018 11:04:00 Ariana [name] ([id number]) -------------------------------------------------------------------------------- Physician Orders Details Patient Name: Ariana [name] Date of Service: 01/05/2018 9:45 AM Medical Record Number: [id number] Patient Account Number: [id number] Date of Birth/Sex: January 10, 1933 (84 y.o. Female) Treating RN: Cornell Barman Primary Care Provider: Fulton Reek Other Clinician: Referring Provider: Referral, Self Treating Provider/Extender: Tito Dine in Treatment: 0 Verbal / Phone Orders: No Diagnosis Coding Wound Cleansing o Clean wound with  Normal Saline. Anesthetic (add to Medication List) Wound #4 Left,Anterior Lower Leg o Topical Lidocaine 4% cream applied to wound bed prior to debridement (In Clinic Only). o Benzocaine Topical Anesthetic Spray applied to wound bed prior to debridement (In Clinic Only). Primary Wound Dressing Wound #4 Left,Anterior Lower Leg o Silver Alginate Secondary Dressing Wound #4 Left,Anterior Lower Leg o Boardered Foam Dressing Dressing Change Frequency Wound #4 Left,Anterior Lower Leg o Three times weekly Follow-up Appointments Wound #4 Left,Anterior Lower Leg o Return Appointment in 1 week. Edema Control Wound #4 Left,Anterior Lower Leg o Patient to wear own compression stockings Additional Orders / Instructions Wound #4 Left,Anterior Lower Leg o Increase protein intake. Patient Medications Allergies: aspirin, diazepam, morphine, tetanus and diphtheria toxoids, adsorbed, adult Notifications Medication Indication Start End lidocaine DOSE topical 4 % cream - cream topical Ariana [name], Ariana [name] ([id number]) Electronic Signature(s) Signed: 01/05/2018 5:06:18 PM By: Gretta Cool, BSN, RN, CWS, Kim RN, BSN Signed: 01/06/2018 8:15:41 AM By: Linton Ham MD Entered By: Gretta Cool, BSN, RN, CWS, Kim on 01/05/2018 10:22:30 Ariana [name], Ariana [name] ([id number]) -------------------------------------------------------------------------------- Prescription 01/05/2018 Patient Name: Ariana [name]. Provider: Ricard Dillon MD Date of Birth: [birth year] NPI#: [id number] Sex: Female DEA#: [id number] Phone #: [phone] License #: [id number] Patient Address: [place], [place] [address] [address] [place], [name] [address] [phone] Allergies aspirin diazepam morphine tetanus and diphtheria toxoids, adsorbed, adult Medication Medication: Route: Strength: Form: lidocaine topical 4% cream Class: TOPICAL LOCAL ANESTHETICS Dose: Frequency / Time: Indication: cream topical Number of Refills: Number of Units: 0 Generic Substitution: Start Date: End Date: Administered at [facility]: Yes Time Administered: Time Discontinued: Note to Pharmacy: Signature(s): Date(s): TASHONNA, DESCOTEAUX ([id number]) Electronic Signature(s) Signed: 01/05/2018 5:06:18 PM By: Gretta Cool, BSN, RN, CWS, Kim RN, BSN Signed: 01/06/2018 8:15:41 AM By: Linton Ham MD Entered By: Gretta Cool, BSN, RN, CWS, Kim on 01/05/2018 10:22:31 Ariana [name] ([id number]) --------------------------------------------------------------------------------  Problem List Details Patient Name: Ariana [name]. Date of Service: 01/05/2018 9:45 AM Medical Record Number: [id number] Patient Account Number: [id number] Date of Birth/Sex: July 07, 1933 (84 y.o. Female) Treating RN: Cornell Barman Primary Care Provider: Fulton Reek Other Clinician: Referring Provider: Referral, Self Treating Provider/Extender: Tito Dine in Treatment: 0 Active Problems ICD-10 Impacting Encounter Code Description Active Date Wound Healing Diagnosis S81.812D Laceration without foreign body, left lower leg, subsequent 01/05/2018 Yes encounter L97.221 Non-pressure chronic ulcer of left calf limited to breakdown of 01/05/2018 Yes skin I87.301 Chronic venous hypertension (idiopathic) without 03/18/3709 Yes complications of right lower extremity D69.3 Immune thrombocytopenic purpura 01/05/2018 Yes Inactive Problems Resolved Problems Electronic Signature(s) Signed: 01/06/2018 8:15:41 AM By: Linton Ham MD Entered By: Linton Ham on  01/05/2018 10:44:12 Ariana [name] ([id number]) -------------------------------------------------------------------------------- Progress Note Details Patient Name: Ariana [name] Date of Service: 01/05/2018 9:45 AM Medical Record Number: [id number] Patient Account Number: [id number] Date of Birth/Sex: 13-Sep-1933 (84 y.o. Female) Treating RN: Cornell Barman Primary Care Provider: Fulton Reek Other Clinician: Referring Provider: Referral, Self Treating Provider/Extender: Ricard Dillon Weeks in Treatment: 0 Subjective History of Present Illness (HPI) 09/15/17; this is an 84 year old woman with a history of type 2 diabetes, chronic ITP for which she follows with oncology. She had a splenectomy in 2007. 3 weeks ago well sitting in the bathroom she fell off the toilet and hit her legs on the bathtub. She had 2 large hematomas. The area on the left has ruptured into a wound  where the area on the right medial lower leg is actually still intact. She was given Keflex of concern for coexistent cellulitis on 11/28 the area on the right is still intact and she is not doing anything specific for that. She has a history of small hematomas related to thrombocytopenia on her lower legs although these all seem to have primarily healed. She has a history of falls 09/22/17; this is a patient who had traumatic hematomas on both her legs. When we admitted to clinic last week the left one had already evacuated whereas the right one had not. She arrived in clinic today with drainage out of the right side. We used Hydrofera Blue to the left 09/29/17; this is a patient who had a traumatic hematoma on both her legs. She has chronic ITP and states she bruises very easily. The right leg hematoma evacuated last week. We have been using Hydrofera Blue to both wound areas on both anterior legs. Today the one on the left is slightly hyper-granulated if the wound stalls we may need to address this.  Otherwise the wounds are smaller and look healthy today 10/06/17; traumatic hematomas on both her legs left greater than right. She has chronic venous insufficiency also chronic ITP. We have been using Hydrofera Blue to both of these wound areas. She has new wounds on the right lateral leg caused by her dog 10/20/17; traumatic hematomas on both her legs left greater than right which is left her with chronic bilateral wounds. She has chronic venous insufficiency also chronic ITP. We've been using Hydrofera Blue to both of these wound areas largely because of hyperventilation. The right lateral leg wound was caused last visit by her dog. The left leg wound was caused by trauma against her bathtub. 10/27/17; these were initially traumatic hematomas on both her legs left greater than right. She hyper-granulation last week and I used silver nitrate. We've been using Hydrofera Blue to the wounds. Much better today especially on the left READMISSION 01/05/18; this is a patient I cared for late in 2018 and into 2019. She had traumatic hematomas on both anterior legs in the setting of chronic ITP. She has had a splenectomy.I have her listed as a type II diabetic although that may be an error. She denies this. In any case she states the wounds from last time" on their own and indeed both the areas seem to appeal today. The current problem started from 12/31/17; she was cleaning on top of her refrigerator standing on some form of stool and she fell backwards. She hit her head and has a laceration type injury on the left lateral leg. She has chronic venous insufficiency but no major edema. She has stockings but does not use them. They've been using topical antibiotics Wound History Patient presents with 1 open wound that has been present for approximately 12/31/17. Patient has been treating wound in the following manner: neosporin. Laboratory tests have not been performed in the last month. Patient reportedly has  not tested positive for an antibiotic resistant organism. Patient reportedly has not tested positive for osteomyelitis. Patient reportedly has not had testing performed to evaluate circulation in the legs. Patient History Information obtained from Patient. Allergies aspirin, diazepam, morphine, tetanus and diphtheria toxoids, adsorbed, adult Family History WALAA, CAREL ([id number]) Diabetes - Siblings, Hypertension - Mother, Stroke - Maternal Grandparents, No family history of Cancer, Heart Disease, Hereditary Spherocytosis, Kidney Disease, Lung Disease, Seizures, Thyroid Problems, Tuberculosis. Social History Never smoker, Marital Status - Married,  Alcohol Use - Never, Drug Use - No History, Caffeine Use - Rarely. Medical History Endocrine Denies history of Type I Diabetes, Type II Diabetes Musculoskeletal Patient has history of Osteoarthritis Review of Systems (ROS) Eyes Complains or has symptoms of Glasses / Contacts. Ear/Nose/Mouth/Throat [facility] Hematologic/Lymphatic The patient has no complaints or symptoms. Respiratory The patient has no complaints or symptoms. Cardiovascular The patient has no complaints or symptoms. Gastrointestinal The patient has no complaints or symptoms. Endocrine Complains or has symptoms of Thyroid disease. Genitourinary The patient has no complaints or symptoms. Immunological The patient has no complaints or symptoms. Integumentary (Skin) Complains or has symptoms of Wounds. Neurologic The patient has no complaints or symptoms. Oncologic The patient has no complaints or symptoms. Psychiatric Complains or has symptoms of Anxiety, depression Objective Constitutional Sitting or standing Blood Pressure is within target range for patient.. Pulse regular and within target range for patient.Marland Ariana [name] Respirations regular, non-labored and within target range.. Temperature is normal and within the target range for the patient.Marland Ariana [name] appears in no  distress.. Vitals Time Taken: 9:48 AM, Height: 66 in, Source: Stated, Weight: 141.1 lbs, Source: Measured, BMI: 22.8, Temperature: 97.9 F, Pulse: 58 bpm, Respiratory Rate: 18 breaths/min, Blood Pressure: 118/86 mmHg. MILAN, CLARE ([id number]) Eyes Conjunctivae clear. No discharge. Respiratory Respiratory effort is easy and symmetric bilaterally. Rate is normal at rest and on room air.. Cardiovascular Femoral arteries without bruits and pulses strong.. Pedal pulses palpable and strong on the left. Lymphatic and palpable in the popliteal or inguinal area. Psychiatric No evidence of depression, anxiety, or agitation. Calm, cooperative, and communicative. Appropriate interactions and affect.. General Notes: wound exam; the area in question is on the left anterior shin area. This looks like a skin tear related to trauma. The area is linear covered in a necrotic surface eschar. Laterally into a more confluent area also covered and eschar. Using a #5 curet the eschar subcutaneous tissue was gently debrided. Hemostasis with direct pressure. Most of the skin over this looks healthy I don't think were going to have an issue here with further skin deterioration. The patient has a small hematoma on her scalp but nothing here looks major. No serious injuries. Integumentary (Hair, Skin) she has changes of chronic venous insufficiency in her legs with marked hemosiderin deposition and skin damage. No other cutaneous issues seen. Wound #4 status is Open. Original cause of wound was Trauma. The wound is located on the Left,Anterior Lower Leg. The wound measures 2cm length x 3.5cm width x 0.1cm depth; 5.498cm^2 area and 0.55cm^3 volume. There is no tunneling or undermining noted. There is a medium amount of serosanguineous drainage noted. The wound margin is distinct with the outline attached to the wound base. There is no granulation within the wound bed. There is a large (67-100%) amount  of necrotic tissue within the wound bed including Eschar and Adherent Slough. Periwound temperature was noted as No Abnormality. The periwound has tenderness on palpation. Assessment Active Problems ICD-10 S81.812D - Laceration without foreign body, left lower leg, subsequent encounter L97.221 - Non-pressure chronic ulcer of left calf limited to breakdown of skin I87.301 - Chronic venous hypertension (idiopathic) without complications of right lower extremity D69.3 - Immune thrombocytopenic purpura Procedures Wound #4 Pre-procedure diagnosis of Wound #4 is a Trauma, Other located on the Left,Anterior Lower Leg . There was a Excisional Skin/Subcutaneous Tissue Debridement with a total area of 7 sq cm performed by Ricard Dillon, MD. With the following instrument(s): Curette. to remove Viable and Non-Viable tissue/material  Material removed includes Subcutaneous Ariana [name], [name] ([id number]) Tissue and Fibrin/Exudate and after achieving pain control using Lidocaine 4% Topical Solution. No specimens were taken. A time out was conducted at 10:18, prior to the start of the procedure. A Moderate amount of bleeding was controlled with Pressure. The procedure was tolerated well with a pain level of 3 throughout and a pain level of 2 following the procedure. Post Debridement Measurements: 2cm length x 3.5cm width x 0.2cm depth; 1.1cm^3 volume. Character of Wound/Ulcer Post Debridement is stable. Post procedure Diagnosis Wound #4: Same as Pre-Procedure Plan Wound Cleansing: Clean wound with Normal Saline. Anesthetic (add to Medication List): Wound #4 Left,Anterior Lower Leg: Topical Lidocaine 4% cream applied to wound bed prior to debridement (In Clinic Only). Benzocaine Topical Anesthetic Spray applied to wound bed prior to debridement (In Clinic Only). Primary Wound Dressing: Wound #4 Left,Anterior Lower Leg: Silver Alginate Secondary Dressing: Wound #4 Left,Anterior Lower  Leg: Boardered Foam Dressing Dressing Change Frequency: Wound #4 Left,Anterior Lower Leg: Three times weekly Follow-up Appointments: Wound #4 Left,Anterior Lower Leg: Return Appointment in 1 week. Edema Control: Wound #4 Left,Anterior Lower Leg: Patient to wear own compression stockings Additional Orders / Instructions: Wound #4 Left,Anterior Lower Leg: Increase protein intake. The following medication(s) was prescribed: lidocaine topical 4 % cream cream topical was prescribed at facility #1 we put silver alginate and border foam. The patient does not have enough edema to justify compression at this point. #2 I don't think there is much of an issue here. I think this was a skin tear. The lower part of this required debridement also the latter part although the surface skin looks satisfactory #3 the patient has chronic ITP but recent platelet count in the 40,000 range this should not cause spontaneous bleeding but I strongly advised her to not put herself in upper carious situation. Electronic Signature(s) Signed: 01/06/2018 8:15:41 AM By: Linton Ham MD Ariana [name] ([id number]) Entered By: Linton Ham on 01/05/2018 11:06:09 Ariana [name] ([id number]) -------------------------------------------------------------------------------- ROS/PFSH Details Patient Name: Ariana [name] Date of Service: 01/05/2018 9:45 AM Medical Record Number: [id number] Patient Account Number: [id number] Date of Birth/Sex: 15-Apr-1933 (84 y.o. Female) Treating RN: Carolyne Fiscal, Debi Primary Care Provider: Fulton Reek Other Clinician: Referring Provider: Referral, Self Treating Provider/Extender: Tito Dine in Treatment: 0 Information Obtained From Patient Wound History Do you currently have one or more open woundso Yes How many open wounds do you currently haveo 1 Approximately how long have you had your woundso 12/31/17 How have you been treating your wound(s)  until nowo neosporin Has your wound(s) ever healed and then re-openedo No Have you had any lab work done in the past montho No Have you tested positive for an antibiotic resistant organism (MRSA, VRE)o No Have you tested positive for osteomyelitis (bone infection)o No Have you had any tests for circulation on your legso No Eyes Complaints and Symptoms: Positive for: Glasses / Contacts Medical History: Positive for: Cataracts - surgery Endocrine Complaints and Symptoms: Positive for: Thyroid disease Medical History: Negative for: Type I Diabetes; Type II Diabetes Integumentary (Skin) Complaints and Symptoms: Positive for: Wounds Psychiatric Complaints and Symptoms: Positive for: Anxiety Review of System Notes: depression Ear/Nose/Mouth/Throat Complaints and Symptoms: Review of System Notes: HOH Hematologic/Lymphatic Ariana [name], Ariana [name] ([id number]) Complaints and Symptoms: No Complaints or Symptoms Respiratory Complaints and Symptoms: No Complaints or Symptoms Cardiovascular Complaints and Symptoms: No Complaints or Symptoms Gastrointestinal Complaints and Symptoms: No Complaints or Symptoms Genitourinary Complaints and Symptoms: No Complaints or Symptoms  Immunological Complaints and Symptoms: No Complaints or Symptoms Musculoskeletal Medical History: Positive for: Osteoarthritis Neurologic Complaints and Symptoms: No Complaints or Symptoms Oncologic Complaints and Symptoms: No Complaints or Symptoms HBO Extended History Items Eyes: Cataracts Immunizations Pneumococcal Vaccine: Received Pneumococcal Vaccination: Yes Implantable Devices Family and Social History Cancer: No; Diabetes: Yes - Siblings; Heart Disease: No; Hereditary Spherocytosis: No; Hypertension: Yes - Mother; Kidney Disease: No; Lung Disease: No; Seizures: No; Stroke: Yes - Maternal Grandparents; Thyroid Problems: No; Tuberculosis: No; Never smoker; Marital Status - Married; Alcohol Use:  Never; Drug Use: No History; Caffeine Use: Rarely; Financial Concerns: No; Food, Clothing or Shelter Needs: No; Support System Lacking: No; Transportation Concerns: No; Advanced Ariana [name], Ariana [name]. ([id number]) Directives: No; Patient does not want information on Advanced Directives; Do not resuscitate: No; Living Will: No; Medical Power of Attorney: No Electronic Signature(s) Signed: 01/05/2018 4:37:47 PM By: Alric Quan Signed: 01/06/2018 8:15:41 AM By: Linton Ham MD Entered By: Alric Quan on 01/05/2018 10:09:30 Ariana [name] ([id number]) -------------------------------------------------------------------------------- SuperBill Details Patient Name: Ariana [name] Date of Service: 01/05/2018 Medical Record Number: [id number] Patient Account Number: [id number] Date of Birth/Sex: [birth year] (84 y.o. Female) Treating RN: Cornell Barman Primary Care Provider: Fulton Reek Other Clinician: Referring Provider: Referral, Self Treating Provider/Extender: Ricard Dillon Weeks in Treatment: 0 Diagnosis Coding ICD-10 Codes Code Description S81.812D Laceration without foreign body, left lower leg, subsequent encounter L97.221 Non-pressure chronic ulcer of left calf limited to breakdown of skin I87.301 Chronic venous hypertension (idiopathic) without complications of right lower extremity D69.3 Immune thrombocytopenic purpura Facility Procedures CPT4 Code: 41660630 Description: 16010 - WOUND CARE VISIT-LEV 3 EST PT Modifier: Quantity: 1 CPT4 Code: 93235573 Description: 22025 - DEB SUBQ TISSUE 20 SQ CM/< ICD-10 Diagnosis Description L97.221 Non-pressure chronic ulcer of left calf limited to breakdown Modifier: of skin Quantity: 1 Physician Procedures CPT4 Code: 4270623 Description: 76283 - WC PHYS LEVEL 3 - EST PT ICD-10 Diagnosis Description S81.812D Laceration without foreign body, left lower leg, subsequent e L97.221 Non-pressure chronic ulcer of left calf  limited to breakdown Modifier: 25 ncounter of skin Quantity: 1 CPT4 Code: 1517616 Description: 07371 - WC PHYS SUBQ TISS 20 SQ CM ICD-10 Diagnosis Description L97.221 Non-pressure chronic ulcer of left calf limited to breakdown Modifier: of skin Quantity: 1 Electronic Signature(s) Signed: 01/06/2018 8:15:41 AM By: Linton Ham MD Entered By: Linton Ham on 01/05/2018 06:26:94

## 2018-01-07 NOTE — Progress Notes (Signed)
CORA, BRIERLEY ([id number]) Visit Report for 01/05/2018 Allergy List Details Patient Name: CARLEA, BADOUR. Date of Service: 01/05/2018 9:45 AM Medical Record Number: [id number] Patient Account Number: [id number] Date of Birth/Sex: 1933/02/14 (84 y.o. Female) Treating RN: Ahmed Prima Primary Care Keano Guggenheim: Fulton Reek Other Clinician: Referring Xylah Early: Referral, Self Treating Avynn Klassen/Extender: Ricard Dillon Weeks in Treatment: 0 Allergies Active Allergies aspirin diazepam morphine tetanus and diphtheria toxoids, adsorbed, adult Allergy Notes Electronic Signature(s) Signed: 01/05/2018 4:37:47 PM By: Alric Quan Entered By: Alric Quan on 01/05/2018 09:52:45 [name] ([id number]) -------------------------------------------------------------------------------- Arrival Information Details Patient Name: [name]. Date of Service: 01/05/2018 9:45 AM Medical Record Number: [id number] Patient Account Number: [id number] Date of Birth/Sex: [birth year] (84 y.o. Female) Treating RN: Carolyne Fiscal, Debi Primary Care Janaya Broy: Fulton Reek Other Clinician: Referring Jonai Weyland: Referral, Self Treating Elise Knobloch/Extender: Tito Dine in Treatment: 0 Visit Information Patient Arrived: Ambulatory Arrival Time: 09:49 Accompanied By: husband Transfer Assistance: None Patient Identification Verified: Yes Secondary Verification Process Yes Completed: Patient Requires Transmission-Based No Precautions: Patient Has Alerts: Yes Patient Alerts: ABI done 09/15/17 0.79 History Since Last Visit Electronic Signature(s) Signed: 01/05/2018 10:10:27 AM By: Alric Quan Entered By: Alric Quan on 01/05/2018 10:10:27 [name] ([id number]) -------------------------------------------------------------------------------- Clinic Level of Care Assessment Details Patient Name: [name] Date of Service: 01/05/2018 9:45  AM Medical Record Number: [id number] Patient Account Number: [id number] Date of Birth/Sex: 1933/01/27 (84 y.o. Female) Treating RN: Cornell Barman Primary Care Lex Linhares: Fulton Reek Other Clinician: Referring Alecia Doi: Referral, Self Treating Dorsel Flinn/Extender: Tito Dine in Treatment: 0 Clinic Level of Care Assessment Items TOOL 1 Quantity Score []  - Use when EandM and Procedure is performed on INITIAL visit 0 ASSESSMENTS - Nursing Assessment / Reassessment X - General Physical Exam (combine w/ comprehensive assessment (listed just below) when 1 20 performed on new pt. evals) X- 1 25 Comprehensive Assessment (HX, ROS, Risk Assessments, Wounds Hx, etc.) ASSESSMENTS - Wound and Skin Assessment / Reassessment []  - Dermatologic / Skin Assessment (not related to wound area) 0 ASSESSMENTS - Ostomy and/or Continence Assessment and Care []  - Incontinence Assessment and Management 0 []  - 0 Ostomy Care Assessment and Management (repouching, etc.) PROCESS - Coordination of Care X - Simple Patient / Family Education for ongoing care 1 15 []  - 0 Complex (extensive) Patient / Family Education for ongoing care X- 1 10 Staff obtains Programmer, systems, Records, Test Results / Process Orders []  - 0 Staff telephones HHA, Nursing Homes / Clarify orders / etc []  - 0 Routine Transfer to another Facility (non-emergent condition) []  - 0 Routine Hospital Admission (non-emergent condition) X- 1 15 New Admissions / Biomedical engineer / Ordering NPWT, Apligraf, etc. []  - 0 Emergency Hospital Admission (emergent condition) PROCESS - Special Needs []  - Pediatric / Minor Patient Management 0 []  - 0 Isolation Patient Management []  - 0 Hearing / Language / Visual special needs []  - 0 Assessment of Community assistance (transportation, D/C planning, etc.) []  - 0 Additional assistance / Altered mentation []  - 0 Support Surface(s) Assessment (bed, cushion, seat, etc.) Sawyer, Daniella E.  ([id number]) INTERVENTIONS - Miscellaneous []  - External ear exam 0 []  - 0 Patient Transfer (multiple staff / Civil Service fast streamer / Similar devices) []  - 0 Simple Staple / Suture removal (25 or less) []  - 0 Complex Staple / Suture removal (26 or more) []  - 0 Hypo/Hyperglycemic Management (do not check if billed separately) []  - 0 Ankle / Brachial Index (ABI) - do not check if billed separately  Has the patient been seen at the hospital within the last three years: Yes Total Score: 85 Level Of Care: New/Established - Level 3 Electronic Signature(s) Signed: 01/05/2018 5:06:18 PM By: Gretta Cool, BSN, RN, CWS, Kim RN, BSN Entered By: Gretta Cool, BSN, RN, CWS, Kim on 01/05/2018 10:22:55 [name] ([id number]) -------------------------------------------------------------------------------- Encounter Discharge Information Details Patient Name: [name]. Date of Service: 01/05/2018 9:45 AM Medical Record Number: [id number] Patient Account Number: [id number] Date of Birth/Sex: 01/31/1933 (84 y.o. Female) Treating RN: Montey Hora Primary Care Briane Birden: Fulton Reek Other Clinician: Referring Armenta Erskin: Referral, Self Treating Gizzelle Lacomb/Extender: Tito Dine in Treatment: 0 Encounter Discharge Information Items Discharge Pain Level: 0 Discharge Condition: Stable Ambulatory Status: Ambulatory Discharge Destination: Home Transportation: Private Auto Accompanied By: spouse Schedule Follow-up Appointment: Yes Medication Reconciliation completed and No provided to Patient/Care Justise Ehmann: Provided on Clinical Summary of Care: 01/05/2018 Form Type Recipient Paper Patient BI Electronic Signature(s) Signed: 01/06/2018 9:24:56 AM By: Ruthine Dose Entered By: Ruthine Dose on 01/05/2018 10:29:48 [name] ([id number]) -------------------------------------------------------------------------------- Lower Extremity Assessment Details Patient Name: [name]. Date of Service: 01/05/2018 9:45 AM Medical Record Number: [id number] Patient Account Number: [id number] Date of Birth/Sex: January 14, 1933 (84 y.o. Female) Treating RN: Ahmed Prima Primary Care Joci Dress: Fulton Reek Other Clinician: Referring Bolton Canupp: Referral, Self Treating Wylie Russon/Extender: Ricard Dillon Weeks in Treatment: 0 Edema Assessment Assessed: [Left: No] [Right: No] Edema: [Left: N] [Right: o] Vascular Assessment Pulses: Dorsalis Pedis Palpable: [Left:Yes] Posterior Tibial Extremity colors, hair growth, and conditions: Extremity Color: [Left:Hyperpigmented] Temperature of Extremity: [Left:Cool] Capillary Refill: [Left:> 3 seconds] Toe Nail Assessment Left: Right: Thick: No Discolored: No Deformed: No Improper Length and Hygiene: No Notes unable to get ABI d/t pain ABI done 09/15/17 0.79 Electronic Signature(s) Signed: 01/05/2018 10:10:15 AM By: Alric Quan Entered By: Alric Quan on 01/05/2018 10:10:15 [name] ([id number]) -------------------------------------------------------------------------------- Multi Wound Chart Details Patient Name: [name]. Date of Service: 01/05/2018 9:45 AM Medical Record Number: [id number] Patient Account Number: [id number] Date of Birth/Sex: [birth year] (84 y.o. Female) Treating RN: Cornell Barman Primary Care Dairon Procter: Fulton Reek Other Clinician: Referring Zorian Gunderman: Referral, Self Treating Tavia Stave/Extender: Ricard Dillon Weeks in Treatment: 0 Vital Signs Height(in): 66 Pulse(bpm): 58 Weight(lbs): 141.1 Blood Pressure(mmHg): 118/86 Body Mass Index(BMI): 23 Temperature(F): 97.9 Respiratory Rate 18 (breaths/min): Photos: [4:No Photos] [N/A:N/A] Wound Location: [4:Left Lower Leg - Anterior] [N/A:N/A] Wounding Event: [4:Trauma] [N/A:N/A] Primary Etiology: [4:Trauma, Other] [N/A:N/A] Comorbid History: [4:Cataracts, Osteoarthritis] [N/A:N/A] Date Acquired: [4:12/31/2017]  [N/A:N/A] Weeks of Treatment: [4:0] [N/A:N/A] Wound Status: [4:Open] [N/A:N/A] Measurements L x W x D [4:2x3.5x0.1] [N/A:N/A] (cm) Area (cm) : [4:5.498] [N/A:N/A] Volume (cm) : [4:0.55] [N/A:N/A] Classification: [4:Full Thickness Without Exposed Support Structures] [N/A:N/A] Exudate Amount: [4:Medium] [N/A:N/A] Exudate Type: [4:Serosanguineous] [N/A:N/A] Exudate Color: [4:red, brown] [N/A:N/A] Wound Margin: [4:Distinct, outline attached] [N/A:N/A] Granulation Amount: [4:None Present (0%)] [N/A:N/A] Necrotic Amount: [4:Large (67-100%)] [N/A:N/A] Necrotic Tissue: [4:Eschar, Adherent Slough] [N/A:N/A] Exposed Structures: [4:Fascia: No Fat Layer (Subcutaneous Tissue) Exposed: No Tendon: No Muscle: No Joint: No Bone: No] [N/A:N/A] Epithelialization: [4:None] [N/A:N/A] Debridement: [4:Debridement - Excisional] [N/A:N/A] Pre-procedure [4:10:18] [N/A:N/A] Verification/Time Out Taken: Pain Control: [4:Lidocaine 4% Topical Solution] [N/A:N/A] Tissue Debrided: [4:Subcutaneous] [N/A:N/A] Level: [4:Skin/Subcutaneous Tissue] [N/A:N/A] Debridement Area (sq cm): [4:7] [N/A:N/A] Instrument: [4:Curette] [N/A:N/A] Bleeding: Moderate N/A N/A Hemostasis Achieved: Pressure N/A N/A Procedural Pain: 3 N/A N/A Post Procedural Pain: 2 N/A N/A Debridement Treatment Procedure was tolerated well N/A N/A Response: Post Debridement 2x3.5x0.2 N/A N/A Measurements L x W x D (cm) Post Debridement Volume: 1.1 N/A  N/A (cm) Periwound Skin Texture: No Abnormalities Noted N/A N/A Periwound Skin Moisture: No Abnormalities Noted N/A N/A Periwound Skin Color: No Abnormalities Noted N/A N/A Temperature: No Abnormality N/A N/A Tenderness on Palpation: Yes N/A N/A Wound Preparation: Ulcer Cleansing: N/A N/A Rinsed/Irrigated with Saline Topical Anesthetic Applied: Other: lidocaine 4% Procedures Performed: Debridement N/A N/A Treatment Notes Wound #4 (Left, Anterior Lower Leg) 1. Cleansed with: Clean wound  with Normal Saline 2. Anesthetic Topical Lidocaine 4% cream to wound bed prior to debridement 3. Peri-wound Care: Skin Prep 4. Dressing Applied: Other dressing (specify in notes) 5. Secondary Dressing Applied Bordered Foam Dressing Notes silvercel Electronic Signature(s) Signed: 01/06/2018 8:15:41 AM By: Linton Ham MD Entered By: Linton Ham on 01/05/2018 10:56:45 [name] ([id number]) -------------------------------------------------------------------------------- Multi-Disciplinary Care Plan Details Patient Name: [name] Date of Service: 01/05/2018 9:45 AM Medical Record Number: [id number] Patient Account Number: [id number] Date of Birth/Sex: 1933-09-10 (84 y.o. Female) Treating RN: Cornell Barman Primary Care Shirlene Andaya: Fulton Reek Other Clinician: Referring Brandonn Capelli: Referral, Self Treating Jatasia Gundrum/Extender: Tito Dine in Treatment: 0 Active Inactive ` Abuse / Safety / Falls / Self Care Management Nursing Diagnoses: History of Falls Goals: Patient will not experience any injury related to falls Date Initiated: 01/05/2018 Target Resolution Date: 02/05/2018 Goal Status: Active Interventions: Assess fall risk on admission and as needed Notes: ` Orientation to the Wound Care Program Nursing Diagnoses: Knowledge deficit related to the wound healing center program Goals: Patient/caregiver will verbalize understanding of the [facility] Date Initiated: 01/05/2018 Target Resolution Date: 02/05/2018 Goal Status: Active Interventions: Provide education on orientation to the wound center Notes: ` Wound/Skin Impairment Nursing Diagnoses: Impaired tissue integrity Goals: Ulcer/skin breakdown will have a volume reduction of 80% by week 12 Date Initiated: 01/05/2018 Target Resolution Date: 05/07/2018 Goal Status: Active Interventions: ELAYNA, TOBLER ([id number]) Assess ulceration(s) every  visit Notes: Electronic Signature(s) Signed: 01/05/2018 5:06:18 PM By: Gretta Cool, BSN, RN, CWS, Kim RN, BSN Entered By: Gretta Cool, BSN, RN, CWS, Kim on 01/05/2018 10:16:21 [name] ([id number]) -------------------------------------------------------------------------------- Pain Assessment Details Patient Name: [name]. Date of Service: 01/05/2018 9:45 AM Medical Record Number: [id number] Patient Account Number: [id number] Date of Birth/Sex: September 26, 1933 (84 y.o. Female) Treating RN: Ahmed Prima Primary Care Geovanni Rahming: Fulton Reek Other Clinician: Referring Apollos Tenbrink: Referral, Self Treating Eland Lamantia/Extender: Tito Dine in Treatment: 0 Active Problems Location of Pain Severity and Description of Pain Patient Has Paino Yes Site Locations Pain Location: Pain in Ulcers Rate the pain. Current Pain Level: 5 Character of Pain Describe the Pain: Aching Pain Management and Medication Current Pain Management: Notes Topical or injectable lidocaine is offered to patient for acute pain when surgical debridement is performed. If needed, Patient is instructed to use over the counter pain medication for the following 24-48 hours after debridement. Wound care MDs do not prescribed pain medications. Patient has chronic pain or uncontrolled pain. Patient has been instructed to make an appointment with their Primary Care Physician for pain management. Electronic Signature(s) Signed: 01/05/2018 4:37:47 PM By: Alric Quan Entered By: Alric Quan on 01/05/2018 09:51:07 [name] ([id number]) -------------------------------------------------------------------------------- Patient/Caregiver Education Details Patient Name: [name] Date of Service: 01/05/2018 9:45 AM Medical Record Number: [id number] Patient Account Number: [id number] Date of Birth/Gender: 08-04-1933 (84 y.o. Female) Treating RN: Montey Hora Primary Care Physician:  Fulton Reek Other Clinician: Referring Physician: Referral, Self Treating Physician/Extender: Tito Dine in Treatment: 0 Education Assessment Education Provided To: Patient and Caregiver Education Topics Provided Wound/Skin  Impairment: Handouts: Other: wound care as ordered Methods: Demonstration, Explain/Verbal Responses: State content correctly Electronic Signature(s) Signed: 01/05/2018 4:02:56 PM By: Montey Hora Entered By: Montey Hora on 01/05/2018 10:29:42 [name] ([id number]) -------------------------------------------------------------------------------- Wound Assessment Details Patient Name: [name]. Date of Service: 01/05/2018 9:45 AM Medical Record Number: [id number] Patient Account Number: [id number] Date of Birth/Sex: 1933-10-07 (84 y.o. Female) Treating RN: Carolyne Fiscal, Debi Primary Care Amand Lemoine: Fulton Reek Other Clinician: Referring Amber Guthridge: Referral, Self Treating Danylah Holden/Extender: Ricard Dillon Weeks in Treatment: 0 Wound Status Wound Number: 4 Primary Etiology: Trauma, Other Wound Location: Left Lower Leg - Anterior Wound Status: Open Wounding Event: Trauma Comorbid History: Cataracts, Osteoarthritis Date Acquired: 12/31/2017 Weeks Of Treatment: 0 Clustered Wound: No Photos Photo Uploaded By: Alric Quan on 01/05/2018 15:48:39 Wound Measurements Length: (cm) 2 Width: (cm) 3.5 Depth: (cm) 0.1 Area: (cm) 5.498 Volume: (cm) 0.55 % Reduction in Area: % Reduction in Volume: Epithelialization: None Tunneling: No Undermining: No Wound Description Full Thickness Without Exposed Support Classification: Structures Wound Margin: Distinct, outline attached Exudate Medium Amount: Exudate Type: Serosanguineous Exudate Color: red, brown Foul Odor After Cleansing: No Slough/Fibrino Yes Wound Bed Granulation Amount: None Present (0%) Exposed Structure Necrotic Amount: Large (67-100%) Fascia  Exposed: No Necrotic Quality: Eschar, Adherent Slough Fat Layer (Subcutaneous Tissue) Exposed: No Tendon Exposed: No Muscle Exposed: No Joint Exposed: No Bone Exposed: No Pruiett, Estephania E. ([id number]) Periwound Skin Texture Texture Color No Abnormalities Noted: No No Abnormalities Noted: No Moisture Temperature / Pain No Abnormalities Noted: No Temperature: No Abnormality Tenderness on Palpation: Yes Wound Preparation Ulcer Cleansing: Rinsed/Irrigated with Saline Topical Anesthetic Applied: Other: lidocaine 4%, Treatment Notes Wound #4 (Left, Anterior Lower Leg) 1. Cleansed with: Clean wound with Normal Saline 2. Anesthetic Topical Lidocaine 4% cream to wound bed prior to debridement 3. Peri-wound Care: Skin Prep 4. Dressing Applied: Other dressing (specify in notes) 5. Secondary Dressing Applied Bordered Foam Dressing Notes silvercel Electronic Signature(s) Signed: 01/05/2018 4:37:47 PM By: Alric Quan Entered By: Alric Quan on 01/05/2018 10:06:35 [name] ([id number]) -------------------------------------------------------------------------------- Vitals Details Patient Name: [name] Date of Service: 01/05/2018 9:45 AM Medical Record Number: [id number] Patient Account Number: [id number] Date of Birth/Sex: [birth year] (84 y.o. Female) Treating RN: Ahmed Prima Primary Care Zaraya Delauder: Fulton Reek Other Clinician: Referring Yamaira Spinner: Referral, Self Treating Jericka Kadar/Extender: Tito Dine in Treatment: 0 Vital Signs Time Taken: 09:48 Temperature (F): 97.9 Height (in): 66 Pulse (bpm): 58 Source: Stated Respiratory Rate (breaths/min): 18 Weight (lbs): 141.1 Blood Pressure (mmHg): 118/86 Source: Measured Reference Range: 80 - 120 mg / dl Body Mass Index (BMI): 22.8 Electronic Signature(s) Signed: 01/05/2018 4:37:47 PM By: Alric Quan Entered By: Alric Quan on 01/05/2018 09:51:23

## 2018-01-12 ENCOUNTER — Other Ambulatory Visit (HOSPITAL_BASED_OUTPATIENT_CLINIC_OR_DEPARTMENT_OTHER): Payer: Self-pay | Admitting: Internal Medicine

## 2018-01-12 ENCOUNTER — Encounter: Payer: Medicare HMO | Attending: Internal Medicine | Admitting: Internal Medicine

## 2018-01-12 ENCOUNTER — Ambulatory Visit
Admission: RE | Admit: 2018-01-12 | Discharge: 2018-01-12 | Disposition: A | Discharge status: Home / Self Care | Payer: Medicare HMO | Source: Ambulatory Visit | Attending: Internal Medicine | Admitting: Internal Medicine

## 2018-01-12 DIAGNOSIS — E11622 Type 2 diabetes mellitus with other skin ulcer: Secondary | ICD-10-CM | POA: Insufficient documentation

## 2018-01-12 DIAGNOSIS — Z9181 History of falling: Secondary | ICD-10-CM | POA: Diagnosis not present

## 2018-01-12 DIAGNOSIS — R609 Edema, unspecified: Secondary | ICD-10-CM

## 2018-01-12 DIAGNOSIS — S81812A Laceration without foreign body, left lower leg, initial encounter: Secondary | ICD-10-CM | POA: Diagnosis not present

## 2018-01-12 DIAGNOSIS — Z9081 Acquired absence of spleen: Secondary | ICD-10-CM | POA: Diagnosis not present

## 2018-01-12 DIAGNOSIS — M79662 Pain in left lower leg: Secondary | ICD-10-CM | POA: Diagnosis not present

## 2018-01-12 DIAGNOSIS — M25572 Pain in left ankle and joints of left foot: Secondary | ICD-10-CM

## 2018-01-12 DIAGNOSIS — M7989 Other specified soft tissue disorders: Secondary | ICD-10-CM | POA: Insufficient documentation

## 2018-01-12 DIAGNOSIS — S8011XA Contusion of right lower leg, initial encounter: Secondary | ICD-10-CM | POA: Insufficient documentation

## 2018-01-12 DIAGNOSIS — I872 Venous insufficiency (chronic) (peripheral): Secondary | ICD-10-CM | POA: Diagnosis not present

## 2018-01-12 DIAGNOSIS — I87301 Chronic venous hypertension (idiopathic) without complications of right lower extremity: Secondary | ICD-10-CM | POA: Diagnosis not present

## 2018-01-12 DIAGNOSIS — E1151 Type 2 diabetes mellitus with diabetic peripheral angiopathy without gangrene: Secondary | ICD-10-CM | POA: Insufficient documentation

## 2018-01-12 DIAGNOSIS — S99912A Unspecified injury of left ankle, initial encounter: Secondary | ICD-10-CM | POA: Diagnosis not present

## 2018-01-12 DIAGNOSIS — D693 Immune thrombocytopenic purpura: Secondary | ICD-10-CM | POA: Diagnosis not present

## 2018-01-12 DIAGNOSIS — L97221 Non-pressure chronic ulcer of left calf limited to breakdown of skin: Secondary | ICD-10-CM | POA: Diagnosis not present

## 2018-01-12 DIAGNOSIS — X58XXXA Exposure to other specified factors, initial encounter: Secondary | ICD-10-CM | POA: Insufficient documentation

## 2018-01-12 DIAGNOSIS — M1990 Unspecified osteoarthritis, unspecified site: Secondary | ICD-10-CM | POA: Diagnosis not present

## 2018-01-12 DIAGNOSIS — S81802A Unspecified open wound, left lower leg, initial encounter: Secondary | ICD-10-CM | POA: Diagnosis not present

## 2018-01-16 NOTE — Progress Notes (Signed)
Ariana [name]  [phone]  [phone]     Ariana [name] DOB: 1933/04/17  MR#: [id number]  OEV#:[id number]  Patient Care Team: Idelle Crouch, MD as PCP - General (Internal Medicine)  CHIEF COMPLAINT: Chronic refractory ITP  INTERVAL HISTORY:  Patient returns to clinic today for repeat laboratory work and consideration of additional Nplate. She does not complain of weakness or fatigue today.  She continues to have occasional easy bruising.  She denies any bleeding. She has no neurologic complaints. She denies any fevers. She has no chest pain or shortness of breath. She denies any nausea, vomiting, constipation, or diarrhea.  She has no melena or hematochezia.  She has no urinary complaints. Patient offers no  specific complaints today.  REVIEW OF SYSTEMS:   Review of Systems  Constitutional: Negative.  Negative for fever, malaise/fatigue and weight loss.  HENT: Negative for congestion.   Respiratory: Negative.  Negative for cough, hemoptysis and shortness of breath.   Cardiovascular: Negative.  Negative for chest pain and leg swelling.  Gastrointestinal: Negative.  Negative for abdominal pain, blood in stool, diarrhea, melena and nausea.  Genitourinary: Negative.  Negative for dysuria and hematuria.  Musculoskeletal: Negative.   Skin: Negative.  Negative for rash.  Neurological: Negative.  Negative for sensory change, focal weakness and weakness.  Endo/Heme/Allergies: Bruises/bleeds easily.  Psychiatric/Behavioral: Negative.  Negative for depression and memory loss. The patient is not nervous/anxious.     As per HPI. Otherwise, a complete review of systems is negative.  PAST MEDICAL HISTORY: Past Medical History:  Diagnosis Date  . Anemia   . Anxiety   . Asthma   . Chronic back pain   . Depression   . Depression   . GERD (gastroesophageal reflux disease)   . Hiatal hernia   . Hypercholesteremia   . ITP (idiopathic  thrombocytopenic purpura)   . Osteoarthritis   . Osteoporosis   . Pulmonary emboli ([place])   . Restless legs     PAST SURGICAL HISTORY: Past Surgical History:  Procedure Laterality Date  . CARPAL TUNNEL RELEASE    . ESOPHAGOGASTRODUODENOSCOPY (EGD) WITH PROPOFOL N/A 09/10/2015   Procedure: ESOPHAGOGASTRODUODENOSCOPY (EGD) WITH PROPOFOL;  Surgeon: Lollie Sails, MD;  Location: [facility] ENDOSCOPY;  Service: Endoscopy;  Laterality: N/A;  . LUMBAR [place]    . SPLENECTOMY, PARTIAL    . TOTAL ABDOMINAL HYSTERECTOMY      FAMILY HISTORY Family History  Problem Relation Age of Onset  . Stroke Mother     GYNECOLOGIC HISTORY:  No LMP recorded. Patient is postmenopausal.     ADVANCED DIRECTIVES:    HEALTH MAINTENANCE: Social History   Tobacco Use  . Smoking status: Never Smoker  . Smokeless tobacco: Never Used  Substance Use Topics  . Alcohol use: No    Alcohol/week: 0.0 oz  . Drug use: No     Allergies  Allergen Reactions  . Aspirin     Other reaction(s): Distress (finding)  . Diazepam     Other reaction(s): Itching of Skin sneezing  . Morphine     Other reaction(s): Itching of Skin  . Other     Other reaction(s): Unknown seasonal allergies  . Tetanus Toxoids     Other reaction(s): Localized superficial swelling of skin    Current Outpatient Medications  Medication Sig Dispense Refill  . acetaminophen (TYLENOL) 325 MG tablet Take 2 tablets (650 mg total) by mouth every 6 (six) hours as needed for mild pain (or Fever >/= 101).  100 tablet 0  . albuterol (PROVENTIL HFA;VENTOLIN HFA) 108 (90 BASE) MCG/ACT inhaler Inhale 2 puffs into the lungs every 6 (six) hours as needed for wheezing or shortness of breath.    . ALPRAZolam (XANAX) 0.5 MG tablet Take 0.5 mg by mouth 3 (three) times daily as needed for anxiety.     Marland Kitchen buPROPion (WELLBUTRIN XL) 150 MG 24 hr tablet Take 150 mg by mouth daily.    . Calcium Carbonate-Vitamin D (CALCIUM 600+D) 600-400 MG-UNIT per tablet  Take 1 tablet by mouth daily.    . citalopram (CELEXA) 40 MG tablet Take 40 mg by mouth daily.    . Cyanocobalamin (RA VITAMIN B-12 TR) 1000 MCG TBCR Take 1,000 mcg by mouth daily.     Marland Kitchen donepezil (ARICEPT) 5 MG tablet Take 10 mg by mouth at bedtime.     . fluticasone (FLONASE) 50 MCG/ACT nasal spray Place into the nose.    Marland Kitchen Fluticasone-Salmeterol (ADVAIR) 250-50 MCG/DOSE AEPB Inhale 1 puff into the lungs daily as needed.     . gabapentin (NEURONTIN) 300 MG capsule Take 300 mg by mouth 4 (four) times daily.    Marland Kitchen HYDROcodone-acetaminophen (NORCO) 7.5-325 MG tablet 1 tablet by mouth q 6 h prn    . levothyroxine (SYNTHROID, LEVOTHROID) 50 MCG tablet Take 50 mcg by mouth daily before breakfast. Take 30 to 60 minutes before breakfast.    . montelukast (SINGULAIR) 10 MG tablet Take by mouth.    . Multiple Vitamins-Minerals (HAIR SKIN AND NAILS FORMULA PO) Take 1 tablet by mouth every evening.    . Multiple Vitamins-Minerals (OCUVITE-LUTEIN PO) Take 1 capsule by mouth daily.     Marland Kitchen oxybutynin (DITROPAN) 5 MG tablet Take 5 mg by mouth 3 (three) times daily.    . pantoprazole (PROTONIX) 20 MG tablet Take 20 mg by mouth 2 (two) times daily.     . phenazopyridine (PYRIDIUM) 200 MG tablet Take 1 tablet (200 mg total) by mouth 3 (three) times daily as needed for pain. 15 tablet 0  . polycarbophil (FIBERCON) 625 MG tablet Take by mouth.    . pravastatin (PRAVACHOL) 20 MG tablet Take 20 mg by mouth daily.    . prochlorperazine (COMPAZINE) 10 MG tablet Take 1 tablet (10 mg total) by mouth every 6 (six) hours as needed for nausea or vomiting. 30 tablet 2  . traMADol (ULTRAM) 50 MG tablet Take 50 mg by mouth 3 (three) times daily as needed for moderate pain or severe pain. pain    . vitamin C (ASCORBIC ACID) 500 MG tablet Take 500 mg by mouth daily.    . vitamin E (E-400) 400 UNIT capsule Take 400 Units by mouth daily.    . memantine (NAMENDA) 5 MG tablet Take 5 mg by mouth 2 (two) times daily.      No current  facility-administered medications for this visit.     OBJECTIVE: BP (!) 152/73 (BP Location: Left Arm, Patient Position: Sitting)   Pulse (!) 52   Temp 98 F (36.7 C) (Tympanic)   Resp 12   Ht 5\' 2"  (1.575 m)   Wt 143 lb (64.9 kg)   BMI 26.16 kg/m    Body mass index is 26.16 kg/m.    ECOG FS:1 - Symptomatic but completely ambulatory  General: Well-developed, well-nourished, no acute distress. Eyes: Pink conjunctiva, anicteric sclera. Lungs: Clear to auscultation bilaterally. Heart: Regular rate and rhythm. No rubs, murmurs, or gallops. Abdomen: Soft, nontender, nondistended. No organomegaly noted, normoactive bowel sounds. Musculoskeletal:  No edema, cyanosis, or clubbing. Neuro: Alert, answering all questions appropriately. Cranial nerves grossly intact. Skin: No rashes or petechiae noted. Psych: Normal affect.  LAB RESULTS:  Appointment on 01/20/2018  Component Date Value Ref Range Status  . WBC 01/20/2018 9.0  3.6 - 11.0 K/uL Final  . RBC 01/20/2018 3.96  3.80 - 5.20 MIL/uL Final  . Hemoglobin 01/20/2018 10.8* 12.0 - 16.0 g/dL Final  . HCT 01/20/2018 33.3* 35.0 - 47.0 % Final  . MCV 01/20/2018 84.1  80.0 - 100.0 fL Final  . MCH 01/20/2018 27.3  26.0 - 34.0 pg Final  . MCHC 01/20/2018 32.4  32.0 - 36.0 g/dL Final  . RDW 01/20/2018 17.8* 11.5 - 14.5 % Final  . Platelets 01/20/2018 64* 150 - 400 K/uL Final   Comment: PLATELET COUNT CONFIRMED BY SMEAR GIANT PLATELETS SEEN Performed at [facility], [address] [place], [place] [id number]     STUDIES: No results found.  ASSESSMENT:  Chronic refractory ITP  PLAN:   1. Chronic refractory ITP: Patient had a poor response to Prednisone, WinRho, and Rituxan. She is status post splenectomy in 2007. Her platelet count is less than 100, although her nadir is higher than usual at 64.  Proceed with 7 mcg/kg Nplate today. Return to clinic in 4 weeks for laboratory work, further evaluation, and consideration of Nplate if  her platelet count continues to be less than 100. Patient appears to only requiring injections every 4 weeks, although this could possibly extended to every 6 weeks if her nadir continues to increase. Can consider Promacta 12.5mg  at a later date if necessary. Max dose of Nplate is 10 mcg/kg. 2. Abdominal pain: Resolved. CT scan did not reveal any distinct etiology. 3. Constipation: Patient does not complain of this today. 4. Weakness and fatigue: Patient does not complain of this today.  Although she feels improved after receiving her injection. 5. Depression/dementia: Monitor. Continue follow-up with primary care.  Approximately 30 minutes was spent in discussion of which greater than 50% was consultation.  Patient expressed understanding and was in agreement with this plan. She also understands that She can call clinic at any time with any questions, concerns, or complaints.    Lloyd Huger, MD   01/22/2018 9:41 AM

## 2018-01-17 NOTE — Progress Notes (Signed)
VALENCIA, KASSA ([id number]) Visit Report for 01/12/2018 Arrival Information Details Patient Name: Ariana [name], Ariana [name]. Date of Service: 01/12/2018 10:00 AM Medical Record Number: [id number] Patient Account Number: [id number] Date of Birth/Sex: 1933-07-10 (84 y.o. F) Treating RN: Roger Shelter Primary Care Melinda Gwinner: Fulton Reek Other Clinician: Referring Finnis Colee: Fulton Reek Treating Kaijah Abts/Extender: Tito Dine in Treatment: 1 Visit Information History Since Last Visit All ordered tests and consults were completed: No Patient Arrived: Ambulatory Added or deleted any medications: No Arrival Time: 10:19 Any new allergies or adverse reactions: No Accompanied By: husband Had a fall or experienced change in No Transfer Assistance: None activities of daily living that may affect Patient Identification Verified: Yes risk of falls: Secondary Verification Process Yes Signs or symptoms of abuse/neglect since last visito No Completed: Hospitalized since last visit: No Patient Requires Transmission-Based No Implantable device outside of the clinic excluding No Precautions: cellular tissue based products placed in the center Patient Has Alerts: Yes since last visit: Patient Alerts: ABI done 09/15/17 Pain Present Now: Yes 0.79 Electronic Signature(s) Signed: 01/12/2018 11:26:12 AM By: Roger Shelter Entered By: Roger Shelter on 01/12/2018 10:21:26 [name] ([id number]) -------------------------------------------------------------------------------- Clinic Level of Care Assessment Details Patient Name: [name] Date of Service: 01/12/2018 10:00 AM Medical Record Number: [id number] Patient Account Number: [id number] Date of Birth/Sex: 1933/03/16 (84 y.o. F) Treating RN: Cornell Barman Primary Care Aeva Posey: Fulton Reek Other Clinician: Referring Orelia Brandstetter: Fulton Reek Treating Krysten Veronica/Extender: Tito Dine in Treatment:  1 Clinic Level of Care Assessment Items TOOL 4 Quantity Score []  - Use when only an EandM is performed on FOLLOW-UP visit 0 ASSESSMENTS - Nursing Assessment / Reassessment []  - Reassessment of Co-morbidities (includes updates in patient status) 0 X- 1 5 Reassessment of Adherence to Treatment Plan ASSESSMENTS - Wound and Skin Assessment / Reassessment X - Simple Wound Assessment / Reassessment - one wound 1 5 []  - 0 Complex Wound Assessment / Reassessment - multiple wounds []  - 0 Dermatologic / Skin Assessment (not related to wound area) ASSESSMENTS - Focused Assessment []  - Circumferential Edema Measurements - multi extremities 0 []  - 0 Nutritional Assessment / Counseling / Intervention []  - 0 Lower Extremity Assessment (monofilament, tuning fork, pulses) []  - 0 Peripheral Arterial Disease Assessment (using hand held doppler) ASSESSMENTS - Ostomy and/or Continence Assessment and Care []  - Incontinence Assessment and Management 0 []  - 0 Ostomy Care Assessment and Management (repouching, etc.) PROCESS - Coordination of Care X - Simple Patient / Family Education for ongoing care 1 15 []  - 0 Complex (extensive) Patient / Family Education for ongoing care X- 1 10 Staff obtains Programmer, systems, Records, Test Results / Process Orders []  - 0 Staff telephones HHA, Nursing Homes / Clarify orders / etc []  - 0 Routine Transfer to another Facility (non-emergent condition) []  - 0 Routine Hospital Admission (non-emergent condition) []  - 0 New Admissions / Biomedical engineer / Ordering NPWT, Apligraf, etc. []  - 0 Emergency Hospital Admission (emergent condition) X- 1 10 Simple Discharge Coordination ARIDAY, BRINKER. ([id number]) []  - 0 Complex (extensive) Discharge Coordination PROCESS - Special Needs []  - Pediatric / Minor Patient Management 0 []  - 0 Isolation Patient Management []  - 0 Hearing / Language / Visual special needs []  - 0 Assessment of Community assistance  (transportation, D/C planning, etc.) []  - 0 Additional assistance / Altered mentation []  - 0 Support Surface(s) Assessment (bed, cushion, seat, etc.) INTERVENTIONS - Wound Cleansing / Measurement X - Simple Wound Cleansing - one wound 1  5 []  - 0 Complex Wound Cleansing - multiple wounds X- 1 5 Wound Imaging (photographs - any number of wounds) []  - 0 Wound Tracing (instead of photographs) X- 1 5 Simple Wound Measurement - one wound []  - 0 Complex Wound Measurement - multiple wounds INTERVENTIONS - Wound Dressings []  - Small Wound Dressing one or multiple wounds 0 []  - 0 Medium Wound Dressing one or multiple wounds X- 1 20 Large Wound Dressing one or multiple wounds []  - 0 Application of Medications - topical []  - 0 Application of Medications - injection INTERVENTIONS - Miscellaneous []  - External ear exam 0 []  - 0 Specimen Collection (cultures, biopsies, blood, body fluids, etc.) []  - 0 Specimen(s) / Culture(s) sent or taken to Lab for analysis []  - 0 Patient Transfer (multiple staff / Civil Service fast streamer / Similar devices) []  - 0 Simple Staple / Suture removal (25 or less) []  - 0 Complex Staple / Suture removal (26 or more) []  - 0 Hypo / Hyperglycemic Management (close monitor of Blood Glucose) []  - 0 Ankle / Brachial Index (ABI) - do not check if billed separately X- 1 5 Vital Signs Mccaslin, Lacreasha E. ([id number]) Has the patient been seen at the hospital within the last three years: Yes Total Score: 85 Level Of Care: New/Established - Level 3 Electronic Signature(s) Signed: 01/17/2018 9:08:37 AM By: Gretta Cool, BSN, RN, CWS, Kim RN, BSN Entered By: Gretta Cool, BSN, RN, CWS, Kim on 01/12/2018 10:42:06 [name] ([id number]) -------------------------------------------------------------------------------- Encounter Discharge Information Details Patient Name: [name]. Date of Service: 01/12/2018 10:00 AM Medical Record Number: [id number] Patient Account Number:  [id number] Date of Birth/Sex: July 24, 1933 (84 y.o. F) Treating RN: Cornell Barman Primary Care Karstyn Birkey: Fulton Reek Other Clinician: Referring Don Tiu: Fulton Reek Treating Luellen Howson/Extender: Tito Dine in Treatment: 1 Encounter Discharge Information Items Discharge Pain Level: 0 Discharge Condition: Stable Ambulatory Status: Ambulatory Discharge Destination: Home Transportation: Private Auto Accompanied By: spouse Schedule Follow-up Appointment: Yes Medication Reconciliation completed and No provided to Patient/Care Morrie Daywalt: Provided on Clinical Summary of Care: 01/12/2018 Form Type Recipient Paper Patient BI Electronic Signature(s) Signed: 01/12/2018 10:57:57 AM By: Montey Hora Entered By: Montey Hora on 01/12/2018 10:57:57 [name] ([id number]) -------------------------------------------------------------------------------- Lower Extremity Assessment Details Patient Name: [name]. Date of Service: 01/12/2018 10:00 AM Medical Record Number: [id number] Patient Account Number: [id number] Date of Birth/Sex: 12-Sep-1933 (84 y.o. F) Treating RN: Roger Shelter Primary Care Offie Waide: Fulton Reek Other Clinician: Referring Lindamarie Maclachlan: Fulton Reek Treating Tryce Surratt/Extender: Tito Dine in Treatment: 1 Edema Assessment Assessed: [Left: No] [Right: No] Edema: [Left: Ye] [Right: s] Vascular Assessment Claudication: Claudication Assessment [Left:None] Pulses: Dorsalis Pedis Palpable: [Left:Yes] Posterior Tibial Extremity colors, hair growth, and conditions: Extremity Color: [Left:Hyperpigmented] Hair Growth on Extremity: [Left:No] Temperature of Extremity: [Left:Warm] Capillary Refill: [Left:< 3 seconds] Toe Nail Assessment Left: Right: Thick: No Discolored: No Deformed: No Improper Length and Hygiene: No Electronic Signature(s) Signed: 01/12/2018 11:26:12 AM By: Roger Shelter Entered By: Roger Shelter on  01/12/2018 10:28:19 [name] ([id number]) -------------------------------------------------------------------------------- Multi Wound Chart Details Patient Name: [name]. Date of Service: 01/12/2018 10:00 AM Medical Record Number: [id number] Patient Account Number: [id number] Date of Birth/Sex: 01-10-1933 (84 y.o. F) Treating RN: Cornell Barman Primary Care Katriana Dortch: Fulton Reek Other Clinician: Referring Johnny Latu: Fulton Reek Treating Sequoyah Counterman/Extender: Tito Dine in Treatment: 1 Vital Signs Height(in): 66 Pulse(bpm): 59 Weight(lbs): 141.1 Blood Pressure(mmHg): 131/95 Body Mass Index(BMI): 23 Temperature(F): 98.2 Respiratory Rate 18 (breaths/min): Photos: [4:No Photos] [N/A:N/A] Wound Location: [  4:Left Lower Leg - Anterior] [N/A:N/A] Wounding Event: [4:Trauma] [N/A:N/A] Primary Etiology: [4:Trauma, Other] [N/A:N/A] Comorbid History: [4:Cataracts, Osteoarthritis] [N/A:N/A] Date Acquired: [4:12/31/2017] [N/A:N/A] Weeks of Treatment: [4:1] [N/A:N/A] Wound Status: [4:Open] [N/A:N/A] Measurements L x W x D [4:2.5x3.5x0.1] [N/A:N/A] (cm) Area (cm) : [4:6.872] [N/A:N/A] Volume (cm) : [4:0.687] [N/A:N/A] % Reduction in Area: [4:-25.00%] [N/A:N/A] % Reduction in Volume: [4:-24.90%] [N/A:N/A] Classification: [4:Full Thickness Without Exposed Support Structures] [N/A:N/A] Exudate Amount: [4:Large] [N/A:N/A] Exudate Type: [4:Serosanguineous] [N/A:N/A] Exudate Color: [4:red, brown] [N/A:N/A] Wound Margin: [4:Distinct, outline attached] [N/A:N/A] Granulation Amount: [4:Small (1-33%)] [N/A:N/A] Granulation Quality: [4:Red, Pink, Hyper-granulation] [N/A:N/A] Necrotic Amount: [4:Large (67-100%)] [N/A:N/A] Necrotic Tissue: [4:Eschar, Adherent Slough] [N/A:N/A] Exposed Structures: [4:Fat Layer (Subcutaneous Tissue) Exposed: Yes Fascia: No Tendon: No Muscle: No Joint: No Bone: No] [N/A:N/A] Epithelialization: [4:None] [N/A:N/A] Periwound Skin  Texture: [4:Excoriation: Yes Induration: No Callus: No Crepitus: No] [N/A:N/A] Rash: No Scarring: No Periwound Skin Moisture: Maceration: Yes N/A N/A Dry/Scaly: No Periwound Skin Color: Erythema: Yes N/A N/A Atrophie Blanche: No Cyanosis: No Ecchymosis: No Hemosiderin Staining: No Mottled: No Pallor: No Rubor: No Erythema Location: Circumferential N/A N/A Temperature: No Abnormality N/A N/A Tenderness on Palpation: Yes N/A N/A Wound Preparation: Ulcer Cleansing: N/A N/A Rinsed/Irrigated with Saline Topical Anesthetic Applied: Other: lidocaine 4% Treatment Notes Electronic Signature(s) Signed: 01/12/2018 4:49:21 PM By: Linton Ham MD Entered By: Linton Ham on 01/12/2018 10:42:36 [name] ([id number]) -------------------------------------------------------------------------------- Multi-Disciplinary Care Plan Details Patient Name: [name] Date of Service: 01/12/2018 10:00 AM Medical Record Number: [id number] Patient Account Number: [id number] Date of Birth/Sex: 18-Dec-1932 (84 y.o. F) Treating RN: Cornell Barman Primary Care Balthazar Dooly: Fulton Reek Other Clinician: Referring Raynette Arras: Fulton Reek Treating Ondria Oswald/Extender: Tito Dine in Treatment: 1 Active Inactive ` Abuse / Safety / Falls / Self Care Management Nursing Diagnoses: History of Falls Goals: Patient will not experience any injury related to falls Date Initiated: 01/05/2018 Target Resolution Date: 02/05/2018 Goal Status: Active Interventions: Assess fall risk on admission and as needed Notes: ` Orientation to the Wound Care Program Nursing Diagnoses: Knowledge deficit related to the wound healing center program Goals: Patient/caregiver will verbalize understanding of the [facility] Date Initiated: 01/05/2018 Target Resolution Date: 02/05/2018 Goal Status: Active Interventions: Provide education on orientation to the wound  center Notes: ` Wound/Skin Impairment Nursing Diagnoses: Impaired tissue integrity Goals: Ulcer/skin breakdown will have a volume reduction of 80% by week 12 Date Initiated: 01/05/2018 Target Resolution Date: 05/07/2018 Goal Status: Active Interventions: DELEAH, TISON ([id number]) Assess ulceration(s) every visit Notes: Electronic Signature(s) Signed: 01/17/2018 9:08:37 AM By: Gretta Cool, BSN, RN, CWS, Kim RN, BSN Entered By: Gretta Cool, BSN, RN, CWS, Kim on 01/12/2018 10:33:02 [name] ([id number]) -------------------------------------------------------------------------------- Pain Assessment Details Patient Name: [name]. Date of Service: 01/12/2018 10:00 AM Medical Record Number: [id number] Patient Account Number: [id number] Date of Birth/Sex: [birth year] (84 y.o. F) Treating RN: Roger Shelter Primary Care Keandre Linden: Fulton Reek Other Clinician: Referring Armando Lauman: Fulton Reek Treating Darcee Dekker/Extender: Tito Dine in Treatment: 1 Active Problems Location of Pain Severity and Description of Pain Patient Has Paino Patient Unable to Respond Site Locations Duration of the Pain. Constant / Intermittento Constant Rate the pain. Current Pain Level: 9 Character of Pain Describe the Pain: Aching, Sharp Pain Management and Medication Current Pain Management: Electronic Signature(s) Signed: 01/12/2018 11:26:12 AM By: Roger Shelter Entered By: Roger Shelter on 01/12/2018 10:21:41 [name] ([id number]) -------------------------------------------------------------------------------- Patient/Caregiver Education Details Patient Name: [name]. Date of Service: 01/12/2018 10:00 AM Medical Record Number:  [id number] Patient Account Number: [id number] Date of Birth/Gender: October 11, 1933 (84 y.o. F) Treating RN: Montey Hora Primary Care Physician: Fulton Reek Other Clinician: Referring Physician: Fulton Reek Treating Physician/Extender: Tito Dine in Treatment: 1 Education Assessment Education Provided To: Patient and Caregiver Education Topics Provided Wound/Skin Impairment: Handouts: Other: wound care as ordered Methods: Demonstration, Explain/Verbal Responses: State content correctly Electronic Signature(s) Signed: 01/12/2018 4:21:37 PM By: Montey Hora Entered By: Montey Hora on 01/12/2018 10:58:21 [name] ([id number]) -------------------------------------------------------------------------------- Wound Assessment Details Patient Name: [name]. Date of Service: 01/12/2018 10:00 AM Medical Record Number: [id number] Patient Account Number: [id number] Date of Birth/Sex: June 10, 1933 (84 y.o. F) Treating RN: Roger Shelter Primary Care Amyla Heffner: Fulton Reek Other Clinician: Referring Jobin Montelongo: Fulton Reek Treating Aliscia Clayton/Extender: Tito Dine in Treatment: 1 Wound Status Wound Number: 4 Primary Etiology: Trauma, Other Wound Location: Left Lower Leg - Anterior Wound Status: Open Wounding Event: Trauma Comorbid History: Cataracts, Osteoarthritis Date Acquired: 12/31/2017 Weeks Of Treatment: 1 Clustered Wound: No Photos Photo Uploaded By: Roger Shelter on 01/12/2018 11:24:19 Wound Measurements Length: (cm) 2.5 Width: (cm) 3.5 Depth: (cm) 0.1 Area: (cm) 6.872 Volume: (cm) 0.687 % Reduction in Area: -25% % Reduction in Volume: -24.9% Epithelialization: None Tunneling: No Undermining: No Wound Description Full Thickness Without Exposed Support Classification: Structures Wound Margin: Distinct, outline attached Exudate Large Amount: Exudate Type: Serosanguineous Exudate Color: red, brown Foul Odor After Cleansing: No Slough/Fibrino Yes Wound Bed Granulation Amount: Small (1-33%) Exposed Structure Granulation Quality: Red, Pink, Hyper-granulation Fascia Exposed: No Necrotic Amount: Large  (67-100%) Fat Layer (Subcutaneous Tissue) Exposed: Yes Necrotic Quality: Eschar, Adherent Slough Tendon Exposed: No Muscle Exposed: No Joint Exposed: No Bone Exposed: No Periwound Skin Texture Texture Color Winfield, Letta E. ([id number]) No Abnormalities Noted: No No Abnormalities Noted: No Callus: No Atrophie Blanche: No Crepitus: No Cyanosis: No Excoriation: Yes Ecchymosis: No Induration: No Erythema: Yes Rash: No Erythema Location: Circumferential Scarring: No Hemosiderin Staining: No Mottled: No Moisture Pallor: No No Abnormalities Noted: No Rubor: No Dry / Scaly: No Maceration: Yes Temperature / Pain Temperature: No Abnormality Tenderness on Palpation: Yes Wound Preparation Ulcer Cleansing: Rinsed/Irrigated with Saline Topical Anesthetic Applied: Other: lidocaine 4%, Treatment Notes Wound #4 (Left, Anterior Lower Leg) 1. Cleansed with: Clean wound with Normal Saline 2. Anesthetic Topical Lidocaine 4% cream to wound bed prior to debridement 4. Dressing Applied: Prisma Ag Other dressing (specify in notes) 5. Secondary Dressing Applied Bordered Foam Dressing Notes drawtex Electronic Signature(s) Signed: 01/12/2018 11:26:12 AM By: Roger Shelter Entered By: Roger Shelter on 01/12/2018 10:26:38 [name] ([id number]) -------------------------------------------------------------------------------- Vitals Details Patient Name: [name] Date of Service: 01/12/2018 10:00 AM Medical Record Number: [id number] Patient Account Number: [id number] Date of Birth/Sex: 06-12-1933 (84 y.o. F) Treating RN: Roger Shelter Primary Care Cylas Falzone: Fulton Reek Other Clinician: Referring Sargent Mankey: Fulton Reek Treating Mitra Duling/Extender: Tito Dine in Treatment: 1 Vital Signs Time Taken: 10:21 Temperature (F): 98.2 Height (in): 66 Pulse (bpm): 59 Weight (lbs): 141.1 Respiratory Rate (breaths/min): 18 Body Mass Index  (BMI): 22.8 Blood Pressure (mmHg): 131/95 Reference Range: 80 - 120 mg / dl Electronic Signature(s) Signed: 01/12/2018 11:26:12 AM By: Roger Shelter Entered By: Roger Shelter on 01/12/2018 10:22:03

## 2018-01-17 NOTE — Progress Notes (Signed)
Ariana [name] ([id number]) Visit Report for 01/12/2018 HPI Details Patient Name: Ariana [name], LOBB. Date of Service: 01/12/2018 10:00 AM Medical Record Number: [id number] Patient Account Number: [id number] Date of Birth/Sex: 1933/06/09 (84 y.o. F) Treating RN: Cornell Barman Primary Care Provider: Fulton Reek Other Clinician: Referring Provider: Fulton Reek Treating Provider/Extender: Tito Dine in Treatment: 1 History of Present Illness HPI Description: 09/15/17; this is an 84 year old woman with a history of type 2 diabetes, chronic ITP for which she follows with oncology. She had a splenectomy in 2007. 3 weeks ago well sitting in the bathroom she fell off the toilet and hit her legs on the bathtub. She had 2 large hematomas. The area on the left has ruptured into a wound where the area on the right medial lower leg is actually still intact. She was given Keflex of concern for coexistent cellulitis on 11/28 the area on the right is still intact and she is not doing anything specific for that. She has a history of small hematomas related to thrombocytopenia on her lower legs although these all seem to have primarily healed. She has a history of falls 09/22/17; this is a patient who had traumatic hematomas on both her legs. When we admitted to clinic last week the left one had already evacuated whereas the right one had not. She arrived in clinic today with drainage out of the right side. We used Hydrofera Blue to the left 09/29/17; this is a patient who had a traumatic hematoma on both her legs. She has chronic ITP and states she bruises very easily. The right leg hematoma evacuated last week. We have been using Hydrofera Blue to both wound areas on both anterior legs. Today the one on the left is slightly hyper-granulated if the wound stalls we may need to address this. Otherwise the wounds are smaller and look healthy today 10/06/17; traumatic hematomas on both her legs  left greater than right. She has chronic venous insufficiency also chronic ITP. We have been using Hydrofera Blue to both of these wound areas. She has new wounds on the right lateral leg caused by her dog 10/20/17; traumatic hematomas on both her legs left greater than right which is left her with chronic bilateral wounds. She has chronic venous insufficiency also chronic ITP. We've been using Hydrofera Blue to both of these wound areas largely because of hyperventilation. The right lateral leg wound was caused last visit by her dog. The left leg wound was caused by trauma against her bathtub. 10/27/17; these were initially traumatic hematomas on both her legs left greater than right. She hyper-granulation last week and I used silver nitrate. We've been using Hydrofera Blue to the wounds. Much better today especially on the left READMISSION 01/05/18; this is a patient I cared for late in 2018 and into 2019. She had traumatic hematomas on both anterior legs in the setting of chronic ITP. She has had a splenectomy.I have her listed as a type II diabetic although that may be an error. She denies this. In any case she states the wounds from last time" on their own and indeed both the areas seem to appeal today. The current problem started from 12/31/17; she was cleaning on top of her refrigerator standing on some form of stool and she fell backwards. She hit her head and has a laceration type injury on the left lateral leg. She has chronic venous insufficiency but no major edema. She has stockings but does not use them. They've been  using topical antibiotics 01/11/18; this is a patient I saw last week who had fallen and had a traumatic laceration on the left lower leg. We used silver alginate. She arrives in clinic today complaining of a lot more pain in the leg. There is some cognitive issues and is bit difficult to be certain what she is describing however it seems more localized to the left anterior  ankle although even that required some assessment. Her husband showed me a picture on his cell phone showing some swelling in the left foot dorsally left ankle on Saturday. They state it is getting difficult for her to walk. I didn't hear anything about this last week. Electronic Signature(s) Signed: 01/12/2018 4:49:21 PM By: Linton Ham MD Entered By: Linton Ham on 01/12/2018 10:44:15 Ariana [name] ([id number]) Ariana [name] ([id number]) -------------------------------------------------------------------------------- Physical Exam Details Patient Name: Ariana [name] Date of Service: 01/12/2018 10:00 AM Medical Record Number: [id number] Patient Account Number: [id number] Date of Birth/Sex: 11-10-1932 (84 y.o. F) Treating RN: Cornell Barman Primary Care Provider: Fulton Reek Other Clinician: Referring Provider: Fulton Reek Treating Provider/Extender: Tito Dine in Treatment: 1 Constitutional Sitting or standing Blood Pressure is within target range for patient.. Pulse regular and within target range for patient.Marland Kitchen Respirations regular, non-labored and within target range.. Temperature is normal and within the target range for the patient.Marland Kitchen appears in no distress. Eyes Conjunctivae clear. No discharge. Respiratory Respiratory effort is easy and symmetric bilaterally. Rate is normal at rest and on room air.. Cardiovascular pedal pulses are easily palpable on the left foot. Musculoskeletal I see no problem with any of the bones in her feet careful examination here was normal. She has normal flexion and extension of the ankle but there appears to be some pain and tenderness and warmth in the left anterior ankle. There is indeed some swelling across the anterior ankle into the malleoli especially medially. There was no specific tenderness over the malleoli. Subsequently she related tenderness along the anterior tibia. Integumentary (Hair,  Skin) severe chronic venous insufficiency but minimal edema. Abdomen the wound and the venous insufficiency I see no other obvious issues. There is no erythema and no overt infection. Notes when exam; the patient is not measuring any better however the surface of the wound looks healthy. Granulation satisfactory. No evidence of surrounding infection. Edema control fairly adequate and her pulses are palpable. Electronic Signature(s) Signed: 01/12/2018 4:49:21 PM By: Linton Ham MD Entered By: Linton Ham on 01/12/2018 10:47:41 Ariana [name] ([id number]) -------------------------------------------------------------------------------- Physician Orders Details Patient Name: Ariana [name] Date of Service: 01/12/2018 10:00 AM Medical Record Number: [id number] Patient Account Number: [id number] Date of Birth/Sex: 01-Feb-1933 (84 y.o. F) Treating RN: Cornell Barman Primary Care Provider: Fulton Reek Other Clinician: Referring Provider: Fulton Reek Treating Provider/Extender: Tito Dine in Treatment: 1 Verbal / Phone Orders: No Diagnosis Coding Wound Cleansing o Clean wound with Normal Saline. Anesthetic (add to Medication List) o Topical Lidocaine 4% cream applied to wound bed prior to debridement (In Clinic Only). Primary Wound Dressing o Silver Collagen - Left lower leg. Secondary Dressing o Drawtex - covered with Bordered Foam Dressing Dressing Change Frequency o Three times weekly Follow-up Appointments Wound #4 Left,Anterior Lower Leg o Return Appointment in 1 week. Edema Control o Patient to wear own compression stockings Additional Orders / Instructions o Increase protein intake. Radiology o X-ray, ankle - Left Ankle due to pain and swelling o X-ray, lower leg - Left Tib/Fib due to pain and sweling. Electronic  Signature(s) Signed: 01/12/2018 4:49:21 PM By: Linton Ham MD Signed: 01/17/2018 9:08:37 AM By: Gretta Cool, BSN, RN,  CWS, Kim RN, BSN Entered By: Gretta Cool, BSN, RN, CWS, Kim on 01/12/2018 10:40:41 Ariana [name], Ariana [name] ([id number]) -------------------------------------------------------------------------------- Prescription 01/12/2018 Patient Name: Ariana [name] Provider: Ricard Dillon MD Date of Birth: 09/16/1933 NPI#: [id number] Sex: F DEA#: [id number] Phone #: [phone] License #: [id number] Patient Address: [place], [place] [address] [address] [place], [place] [address] [phone] Allergies aspirin diazepam morphine tetanus and diphtheria toxoids, adsorbed, adult Provider's Orders o X-ray, ankle - Left Ankle due to pain and swelling Signature(s): Date(s): BRITYN, MASTROGIOVANNI ([id number]) Prescription 01/12/2018 Patient Name: Ariana [name], Ariana [name]. Provider: Ricard Dillon MD Date of Birth: Nov 14, 1932 NPI#: [id number] Sex: F DEA#: [id number] Phone #: [phone] License #: [id number] Patient Address: [place], [place] [address] [address] [place], [place] [address] [phone] Allergies aspirin diazepam morphine tetanus and diphtheria toxoids, adsorbed, adult Provider's Orders o X-ray, lower leg - Left Tib/Fib due to pain and sweling. Signature(s): Date(s): Engineer, maintenance) Signed: 01/12/2018 4:49:21 PM By: Linton Ham MD Signed: 01/17/2018 9:08:37 AM By: Gretta Cool, BSN, RN, CWS, Kim RN, BSN Entered By: Gretta Cool, BSN, RN, CWS, Kim on 01/12/2018 10:40:41 Ariana [name] ([id number]) --------------------------------------------------------------------------------  Problem List Details Patient Name: Ariana [name] Date of Service: 01/12/2018 10:00 AM Medical Record Number: [id number] Patient Account Number: [id number] Date of Birth/Sex:  1933-03-21 (84 y.o. F) Treating RN: Cornell Barman Primary Care Provider: Fulton Reek Other Clinician: Referring Provider: Fulton Reek Treating Provider/Extender: Tito Dine in Treatment: 1 Active Problems ICD-10 Impacting Encounter Code Description Active Date Wound Healing Diagnosis S81.812D Laceration without foreign body, left lower leg, subsequent 01/05/2018 Yes encounter L97.221 Non-pressure chronic ulcer of left calf limited to breakdown of 01/05/2018 Yes skin I87.301 Chronic venous hypertension (idiopathic) without 01/28/6221 Yes complications of right lower extremity D69.3 Immune thrombocytopenic purpura 01/05/2018 Yes S93.402D Sprain of unspecified ligament of left ankle, subsequent 01/12/2018 Yes encounter Inactive Problems Resolved Problems Electronic Signature(s) Signed: 01/12/2018 4:49:21 PM By: Linton Ham MD Entered By: Linton Ham on 01/12/2018 10:42:21 Ariana [name] ([id number]) -------------------------------------------------------------------------------- Progress Note Details Patient Name: Ariana [name] Date of Service: 01/12/2018 10:00 AM Medical Record Number: [id number] Patient Account Number: [id number] Date of Birth/Sex: December 04, 1932 (84 y.o. F) Treating RN: Cornell Barman Primary Care Provider: Fulton Reek Other Clinician: Referring Provider: Fulton Reek Treating Provider/Extender: Tito Dine in Treatment: 1 Subjective History of Present Illness (HPI) 09/15/17; this is an 84 year old woman with a history of type 2 diabetes, chronic ITP for which she follows with oncology. She had a splenectomy in 2007. 3 weeks ago well sitting in the bathroom she fell off the toilet and hit her legs on the bathtub. She had 2 large hematomas. The area on the left has ruptured into a wound where the area on the right medial lower leg is actually still intact. She was given Keflex of concern for coexistent cellulitis on 11/28  the area on the right is still intact and she is not doing anything specific for that. She has a history of small hematomas related to thrombocytopenia on her lower legs although these all seem to have primarily healed. She has a history of falls 09/22/17; this is a patient who had traumatic hematomas on both her legs. When  we admitted to clinic last week the left one had already evacuated whereas the right one had not. She arrived in clinic today with drainage out of the right side. We used Hydrofera Blue to the left 09/29/17; this is a patient who had a traumatic hematoma on both her legs. She has chronic ITP and states she bruises very easily. The right leg hematoma evacuated last week. We have been using Hydrofera Blue to both wound areas on both anterior legs. Today the one on the left is slightly hyper-granulated if the wound stalls we may need to address this. Otherwise the wounds are smaller and look healthy today 10/06/17; traumatic hematomas on both her legs left greater than right. She has chronic venous insufficiency also chronic ITP. We have been using Hydrofera Blue to both of these wound areas. She has new wounds on the right lateral leg caused by her dog 10/20/17; traumatic hematomas on both her legs left greater than right which is left her with chronic bilateral wounds. She has chronic venous insufficiency also chronic ITP. We've been using Hydrofera Blue to both of these wound areas largely because of hyperventilation. The right lateral leg wound was caused last visit by her dog. The left leg wound was caused by trauma against her bathtub. 10/27/17; these were initially traumatic hematomas on both her legs left greater than right. She hyper-granulation last week and I used silver nitrate. We've been using Hydrofera Blue to the wounds. Much better today especially on the left READMISSION 01/05/18; this is a patient I cared for late in 2018 and into 2019. She had traumatic hematomas  on both anterior legs in the setting of chronic ITP. She has had a splenectomy.I have her listed as a type II diabetic although that may be an error. She denies this. In any case she states the wounds from last time" on their own and indeed both the areas seem to appeal today. The current problem started from 12/31/17; she was cleaning on top of her refrigerator standing on some form of stool and she fell backwards. She hit her head and has a laceration type injury on the left lateral leg. She has chronic venous insufficiency but no major edema. She has stockings but does not use them. They've been using topical antibiotics 01/11/18; this is a patient I saw last week who had fallen and had a traumatic laceration on the left lower leg. We used silver alginate. She arrives in clinic today complaining of a lot more pain in the leg. There is some cognitive issues and is bit difficult to be certain what she is describing however it seems more localized to the left anterior ankle although even that required some assessment. Her husband showed me a picture on his cell phone showing some swelling in the left foot dorsally left ankle on Saturday. They state it is getting difficult for her to walk. I didn't hear anything about this last week. Ariana [name], Ariana [name] ([id number]) Objective Constitutional Sitting or standing Blood Pressure is within target range for patient.. Pulse regular and within target range for patient.Marland Kitchen Respirations regular, non-labored and within target range.. Temperature is normal and within the target range for the patient.Marland Kitchen appears in no distress. Vitals Time Taken: 10:21 AM, Height: 66 in, Weight: 141.1 lbs, BMI: 22.8, Temperature: 98.2 F, Pulse: 59 bpm, Respiratory Rate: 18 breaths/min, Blood Pressure: 131/95 mmHg. Eyes Conjunctivae clear. No discharge. Respiratory Respiratory effort is easy and symmetric bilaterally. Rate is normal at rest and on room  air.. Cardiovascular pedal pulses are easily palpable on the left foot. Musculoskeletal I see no problem with any of the bones in her feet careful examination here was normal. She has normal flexion and extension of the ankle but there appears to be some pain and tenderness and warmth in the left anterior ankle. There is indeed some swelling across the anterior ankle into the malleoli especially medially. There was no specific tenderness over the malleoli. Subsequently she related tenderness along the anterior tibia. General Notes: when exam; the patient is not measuring any better however the surface of the wound looks healthy. Granulation satisfactory. No evidence of surrounding infection. Edema control fairly adequate and her pulses are palpable. Integumentary (Hair, Skin) severe chronic venous insufficiency but minimal edema. Abdomen the wound and the venous insufficiency I see no other obvious issues. There is no erythema and no overt infection. Wound #4 status is Open. Original cause of wound was Trauma. The wound is located on the Left,Anterior Lower Leg. The wound measures 2.5cm length x 3.5cm width x 0.1cm depth; 6.872cm^2 area and 0.687cm^3 volume. There is Fat Layer (Subcutaneous Tissue) Exposed exposed. There is no tunneling or undermining noted. There is a large amount of serosanguineous drainage noted. The wound margin is distinct with the outline attached to the wound base. There is small (1-33%) red, pink, hyper - granulation within the wound bed. There is a large (67-100%) amount of necrotic tissue within the wound bed including Eschar and Adherent Slough. The periwound skin appearance exhibited: Excoriation, Maceration, Erythema. The periwound skin appearance did not exhibit: Callus, Crepitus, Induration, Rash, Scarring, Dry/Scaly, Atrophie Blanche, Cyanosis, Ecchymosis, Hemosiderin Staining, Mottled, Pallor, Rubor. The surrounding wound skin color is noted with erythema which  is circumferential. Periwound temperature was noted as No Abnormality. The periwound has tenderness on palpation. Assessment Active Problems ICD-10 S81.812D - Laceration without foreign body, left lower leg, subsequent encounter L97.221 - Non-pressure chronic ulcer of left calf limited to breakdown of skin I87.301 - Chronic venous hypertension (idiopathic) without complications of right lower extremity D69.3 - Immune thrombocytopenic purpura Ariana [name], [name] ([id number]) S93.402D - Sprain of unspecified ligament of left ankle, subsequent encounter Plan Wound Cleansing: Clean wound with Normal Saline. Anesthetic (add to Medication List): Topical Lidocaine 4% cream applied to wound bed prior to debridement (In Clinic Only). Primary Wound Dressing: Silver Collagen - Left lower leg. Secondary Dressing: Drawtex - covered with Bordered Foam Dressing Dressing Change Frequency: Three times weekly Follow-up Appointments: Wound #4 Left,Anterior Lower Leg: Return Appointment in 1 week. Edema Control: Patient to wear own compression stockings Additional Orders / Instructions: Increase protein intake. Radiology ordered were: X-ray, ankle - Left Ankle due to pain and swelling, X-ray, lower leg - Left Tib/Fib due to pain and sweling. #1 we change from silver alginate to silver collagen to the left lower leg attacks border foam #2 I sent her for an x-ray of the left ankle although I'm doubtful there is a fracture or sprain is certainly possible. I didn't see anything like this last week #3 the patient was very anxious difficult to localize the source of her pain although I think it is left anterior ankle Electronic Signature(s) Signed: 01/12/2018 10:49:34 AM By: Linton Ham MD Entered By: Linton Ham on 01/12/2018 10:49:33 Ariana [name] ([id number]) -------------------------------------------------------------------------------- SuperBill Details Patient Name: Ariana [name]. Date of Service: 01/12/2018 Medical Record Number: [id number] Patient Account Number: [id number] Date of Birth/Sex: 09/26/1933 (84 y.o. F) Treating RN: Cornell Barman Primary Care Provider: Fulton Reek  Other Clinician: Referring Provider: Fulton Reek Treating Provider/Extender: Tito Dine in Treatment: 1 Diagnosis Coding ICD-10 Codes Code Description [phone] Laceration without foreign body, left lower leg, subsequent encounter L97.221 Non-pressure chronic ulcer of left calf limited to breakdown of skin I87.301 Chronic venous hypertension (idiopathic) without complications of right lower extremity D69.3 Immune thrombocytopenic purpura S93.402D Sprain of unspecified ligament of left ankle, subsequent encounter Facility Procedures CPT4 Code: [id number] Description: 99213 - WOUND CARE VISIT-LEV 3 EST PT Modifier: Quantity: 1 Physician Procedures CPT4 Code: 9774142 Description: 39532 - WC PHYS LEVEL 3 - EST PT ICD-10 Diagnosis Description S81.812D Laceration without foreign body, left lower leg, subsequen L97.221 Non-pressure chronic ulcer of left calf limited to breakdo S93.402D Sprain of unspecified ligament of  left ankle, subsequent e Modifier: t encounter wn of skin ncounter Quantity: 1 Electronic Signature(s) Signed: 01/12/2018 4:49:21 PM By: Linton Ham MD Entered By: Linton Ham on 01/12/2018 10:50:05

## 2018-01-19 ENCOUNTER — Encounter: Payer: Medicare HMO | Admitting: Internal Medicine

## 2018-01-19 DIAGNOSIS — I87301 Chronic venous hypertension (idiopathic) without complications of right lower extremity: Secondary | ICD-10-CM | POA: Diagnosis not present

## 2018-01-19 DIAGNOSIS — R600 Localized edema: Secondary | ICD-10-CM | POA: Diagnosis not present

## 2018-01-19 DIAGNOSIS — S81802A Unspecified open wound, left lower leg, initial encounter: Secondary | ICD-10-CM | POA: Diagnosis not present

## 2018-01-19 DIAGNOSIS — E1151 Type 2 diabetes mellitus with diabetic peripheral angiopathy without gangrene: Secondary | ICD-10-CM | POA: Diagnosis not present

## 2018-01-19 DIAGNOSIS — M1990 Unspecified osteoarthritis, unspecified site: Secondary | ICD-10-CM | POA: Diagnosis not present

## 2018-01-19 DIAGNOSIS — S81812A Laceration without foreign body, left lower leg, initial encounter: Secondary | ICD-10-CM | POA: Diagnosis not present

## 2018-01-19 DIAGNOSIS — E11622 Type 2 diabetes mellitus with other skin ulcer: Secondary | ICD-10-CM | POA: Diagnosis not present

## 2018-01-19 DIAGNOSIS — S8011XA Contusion of right lower leg, initial encounter: Secondary | ICD-10-CM | POA: Diagnosis not present

## 2018-01-19 DIAGNOSIS — D693 Immune thrombocytopenic purpura: Secondary | ICD-10-CM | POA: Diagnosis not present

## 2018-01-19 DIAGNOSIS — L97221 Non-pressure chronic ulcer of left calf limited to breakdown of skin: Secondary | ICD-10-CM | POA: Diagnosis not present

## 2018-01-19 DIAGNOSIS — I872 Venous insufficiency (chronic) (peripheral): Secondary | ICD-10-CM | POA: Diagnosis not present

## 2018-01-20 ENCOUNTER — Encounter: Payer: Self-pay | Admitting: Oncology

## 2018-01-20 ENCOUNTER — Inpatient Hospital Stay (HOSPITAL_BASED_OUTPATIENT_CLINIC_OR_DEPARTMENT_OTHER): Payer: Medicare HMO | Admitting: Oncology

## 2018-01-20 ENCOUNTER — Inpatient Hospital Stay: Payer: Medicare HMO | Attending: Oncology

## 2018-01-20 ENCOUNTER — Inpatient Hospital Stay: Payer: Medicare HMO

## 2018-01-20 ENCOUNTER — Other Ambulatory Visit: Payer: Self-pay

## 2018-01-20 VITALS — BP 152/73 | HR 52 | Temp 98.0°F | Resp 12 | Ht 62.0 in | Wt 143.0 lb

## 2018-01-20 DIAGNOSIS — D693 Immune thrombocytopenic purpura: Secondary | ICD-10-CM | POA: Diagnosis not present

## 2018-01-20 DIAGNOSIS — F0390 Unspecified dementia without behavioral disturbance: Secondary | ICD-10-CM

## 2018-01-20 DIAGNOSIS — F329 Major depressive disorder, single episode, unspecified: Secondary | ICD-10-CM | POA: Diagnosis not present

## 2018-01-20 LAB — CBC
HEMATOCRIT: 33.3 % — AB (ref 35.0–47.0)
Hemoglobin: 10.8 g/dL — ABNORMAL LOW (ref 12.0–16.0)
MCH: 27.3 pg (ref 26.0–34.0)
MCHC: 32.4 g/dL (ref 32.0–36.0)
MCV: 84.1 fL (ref 80.0–100.0)
Platelets: 64 10*3/uL — ABNORMAL LOW (ref 150–400)
RBC: 3.96 MIL/uL (ref 3.80–5.20)
RDW: 17.8 % — AB (ref 11.5–14.5)
WBC: 9 10*3/uL (ref 3.6–11.0)

## 2018-01-20 MED ORDER — ROMIPLOSTIM INJECTION 500 MCG
440.0000 ug | Freq: Once | SUBCUTANEOUS | Status: AC
  Administered 2018-01-20: 440 ug via SUBCUTANEOUS
  Filled 2018-01-20: qty 0.88

## 2018-01-21 NOTE — Progress Notes (Signed)
WLADYSLAWA, DISBRO ([id number]) Visit Report for 01/19/2018 HPI Details Patient Name: Ariana [name], Ariana [name]. Date of Service: 01/19/2018 9:30 AM Medical Record Number: [id number] Patient Account Number: [id number] Date of Birth/Sex: 1932-10-26 (84 y.o. F) Treating RN: Cornell Barman Primary Care Provider: Fulton Reek Other Clinician: Referring Provider: Fulton Reek Treating Provider/Extender: Tito Dine in Treatment: 2 History of Present Illness HPI Description: 09/15/17; this is an 84 year old woman with a history of type 2 diabetes, chronic ITP for which she follows with oncology. She had a splenectomy in 2007. 3 weeks ago well sitting in the bathroom she fell off the toilet and hit her legs on the bathtub. She had 2 large hematomas. The area on the left has ruptured into a wound where the area on the right medial lower leg is actually still intact. She was given Keflex of concern for coexistent cellulitis on 11/28 the area on the right is still intact and she is not doing anything specific for that. She has a history of small hematomas related to thrombocytopenia on her lower legs although these all seem to have primarily healed. She has a history of falls 09/22/17; this is a patient who had traumatic hematomas on both her legs. When we admitted to clinic last week the left one had already evacuated whereas the right one had not. She arrived in clinic today with drainage out of the right side. We used Hydrofera Blue to the left 09/29/17; this is a patient who had a traumatic hematoma on both her legs. She has chronic ITP and states she bruises very easily. The right leg hematoma evacuated last week. We have been using Hydrofera Blue to both wound areas on both anterior legs. Today the one on the left is slightly hyper-granulated if the wound stalls we may need to address this. Otherwise the wounds are smaller and look healthy today 10/06/17; traumatic hematomas on both her  legs left greater than right. She has chronic venous insufficiency also chronic ITP. We have been using Hydrofera Blue to both of these wound areas. She has new wounds on the right lateral leg caused by her dog 10/20/17; traumatic hematomas on both her legs left greater than right which is left her with chronic bilateral wounds. She has chronic venous insufficiency also chronic ITP. We've been using Hydrofera Blue to both of these wound areas largely because of hyperventilation. The right lateral leg wound was caused last visit by her dog. The left leg wound was caused by trauma against her bathtub. 10/27/17; these were initially traumatic hematomas on both her legs left greater than right. She hyper-granulation last week and I used silver nitrate. We've been using Hydrofera Blue to the wounds. Much better today especially on the left READMISSION 01/05/18; this is a patient I cared for late in 2018 and into 2019. She had traumatic hematomas on both anterior legs in the setting of chronic ITP. She has had a splenectomy.I have her listed as a type II diabetic although that may be an error. She denies this. In any case she states the wounds from last time" on their own and indeed both the areas seem to appeal today. The current problem started from 12/31/17; she was cleaning on top of her refrigerator standing on some form of stool and she fell backwards. She hit her head and has a laceration type injury on the left lateral leg. She has chronic venous insufficiency but no major edema. She has stockings but does not use them. They've been  using topical antibiotics 01/11/18; this is a patient I saw last week who had fallen and had a traumatic laceration on the left lower leg. We used silver alginate. She arrives in clinic today complaining of a lot more pain in the leg. There is some cognitive issues and is bit difficult to be certain what she is describing however it seems more localized to the left anterior  ankle although even that required some assessment. Her husband showed me a picture on his cell phone showing some swelling in the left foot dorsally left ankle on Saturday. They state it is getting difficult for her to walk. I didn't hear anything about this last week. 01/19/18; x-rays we did last week of this patient's left ankle and left tib-fib were negative. There was no osteomyelitis. She is still anxious and complains of pain. We are using silver collagen In general she looks better this week there is still some minor edema Electronic Signature(s) Signed: 01/19/2018 5:02:04 PM By: Linton Ham MD Ariana [name] ([id number]) Entered By: Linton Ham on 01/19/2018 10:14:38 Ariana [name] ([id number]) -------------------------------------------------------------------------------- Physical Exam Details Patient Name: Ariana [name]. Date of Service: 01/19/2018 9:30 AM Medical Record Number: [id number] Patient Account Number: [id number] Date of Birth/Sex: 23-Aug-1933 (84 y.o. F) Treating RN: Cornell Barman Primary Care Provider: Fulton Reek Other Clinician: Referring Provider: Fulton Reek Treating Provider/Extender: Tito Dine in Treatment: 2 Constitutional Sitting or standing Blood Pressure is within target range for patient.Marland Kitchen Respirations regular, non-labored and within target range.. Temperature is normal and within the target range for the patient.Marland Kitchen appears in no distress. Respiratory Respiratory effort is easy and symmetric bilaterally. Rate is normal at rest and on room air.. Cardiovascular Femoral arteries without bruits and pulses strong.. Pedal pulses palpable. Lymphatic none palpable in the left popliteal or inguinal area. Psychiatric Patient appears very anxious possibly with coexistent depression. Notes wound exam; the patient has improved looking wound surface. She has some surface slough but she is so anxious with any manipulation near  the wound area no debridement. She has minor edema which seems to bother her great deal. Severe bilateral hemosiderin deposition compatible with her known venous insufficiency Electronic Signature(s) Signed: 01/19/2018 5:02:04 PM By: Linton Ham MD Entered By: Linton Ham on 01/19/2018 10:36:11 Ariana [name] ([id number]) -------------------------------------------------------------------------------- Physician Orders Details Patient Name: Ariana [name]. Date of Service: 01/19/2018 9:30 AM Medical Record Number: [id number] Patient Account Number: [id number] Date of Birth/Sex: [birth year] (84 y.o. F) Treating RN: Cornell Barman Primary Care Provider: Fulton Reek Other Clinician: Referring Provider: Fulton Reek Treating Provider/Extender: Tito Dine in Treatment: 2 Verbal / Phone Orders: No Diagnosis Coding Wound Cleansing Wound #4 Left,Anterior Lower Leg o Clean wound with Normal Saline. Anesthetic (add to Medication List) Wound #4 Left,Anterior Lower Leg o Topical Lidocaine 4% cream applied to wound bed prior to debridement (In Clinic Only). Primary Wound Dressing Wound #4 Left,Anterior Lower Leg o Silver Collagen - Left lower leg. Secondary Dressing Wound #4 Left,Anterior Lower Leg o Drawtex - covered with Bordered Foam Dressing Dressing Change Frequency Wound #4 Left,Anterior Lower Leg o Three times weekly Follow-up Appointments Wound #4 Left,Anterior Lower Leg o Return Appointment in 1 week. Edema Control Wound #4 Left,Anterior Lower Leg o Patient to wear own compression stockings Additional Orders / Instructions Wound #4 Left,Anterior Lower Leg o Increase protein intake. Electronic Signature(s) Signed: 01/19/2018 1:43:28 PM By: Gretta Cool, BSN, RN, CWS, Kim RN, BSN Signed: 01/19/2018 5:02:04 PM By: Linton Ham MD Entered By: Gretta Cool,  BSN, RN, CWS, Kim on 01/19/2018 09:59:45 Ariana [name], Ariana [name]  ([id number]) -------------------------------------------------------------------------------- Problem List Details Patient Name: YURIKA, PEREDA. Date of Service: 01/19/2018 9:30 AM Medical Record Number: [id number] Patient Account Number: [id number] Date of Birth/Sex: 01-Jun-1933 (84 y.o. F) Treating RN: Cornell Barman Primary Care Provider: Fulton Reek Other Clinician: Referring Provider: Fulton Reek Treating Provider/Extender: Tito Dine in Treatment: 2 Active Problems ICD-10 Impacting Encounter Code Description Active Date Wound Healing Diagnosis S81.812D Laceration without foreign body, left lower leg, subsequent 01/05/2018 Yes encounter L97.221 Non-pressure chronic ulcer of left calf limited to breakdown of 01/05/2018 Yes skin I87.301 Chronic venous hypertension (idiopathic) without 2/42/6834 Yes complications of right lower extremity D69.3 Immune thrombocytopenic purpura 01/05/2018 Yes S93.402D Sprain of unspecified ligament of left ankle, subsequent 01/12/2018 Yes encounter Inactive Problems Resolved Problems Electronic Signature(s) Signed: 01/19/2018 5:02:04 PM By: Linton Ham MD Entered By: Linton Ham on 01/19/2018 10:10:39 Ariana [name] ([id number]) -------------------------------------------------------------------------------- Progress Note Details Patient Name: Ariana [name] Date of Service: 01/19/2018 9:30 AM Medical Record Number: [id number] Patient Account Number: [id number] Date of Birth/Sex: [birth year] (84 y.o. F) Treating RN: Cornell Barman Primary Care Provider: Fulton Reek Other Clinician: Referring Provider: Fulton Reek Treating Provider/Extender: Tito Dine in Treatment: 2 Subjective History of Present Illness (HPI) 09/15/17; this is an 84 year old woman with a history of type 2 diabetes, chronic ITP for which she follows with oncology. She had a splenectomy in 2007. 3 weeks ago well sitting in the  bathroom she fell off the toilet and hit her legs on the bathtub. She had 2 large hematomas. The area on the left has ruptured into a wound where the area on the right medial lower leg is actually still intact. She was given Keflex of concern for coexistent cellulitis on 11/28 the area on the right is still intact and she is not doing anything specific for that. She has a history of small hematomas related to thrombocytopenia on her lower legs although these all seem to have primarily healed. She has a history of falls 09/22/17; this is a patient who had traumatic hematomas on both her legs. When we admitted to clinic last week the left one had already evacuated whereas the right one had not. She arrived in clinic today with drainage out of the right side. We used Hydrofera Blue to the left 09/29/17; this is a patient who had a traumatic hematoma on both her legs. She has chronic ITP and states she bruises very easily. The right leg hematoma evacuated last week. We have been using Hydrofera Blue to both wound areas on both anterior legs. Today the one on the left is slightly hyper-granulated if the wound stalls we may need to address this. Otherwise the wounds are smaller and look healthy today 10/06/17; traumatic hematomas on both her legs left greater than right. She has chronic venous insufficiency also chronic ITP. We have been using Hydrofera Blue to both of these wound areas. She has new wounds on the right lateral leg caused by her dog 10/20/17; traumatic hematomas on both her legs left greater than right which is left her with chronic bilateral wounds. She has chronic venous insufficiency also chronic ITP. We've been using Hydrofera Blue to both of these wound areas largely because of hyperventilation. The right lateral leg wound was caused last visit by her dog. The left leg wound was caused by trauma against her bathtub. 10/27/17; these were initially traumatic hematomas on both her legs  left greater than right.  She hyper-granulation last week and I used silver nitrate. We've been using Hydrofera Blue to the wounds. Much better today especially on the left READMISSION 01/05/18; this is a patient I cared for late in 2018 and into 2019. She had traumatic hematomas on both anterior legs in the setting of chronic ITP. She has had a splenectomy.I have her listed as a type II diabetic although that may be an error. She denies this. In any case she states the wounds from last time" on their own and indeed both the areas seem to appeal today. The current problem started from 12/31/17; she was cleaning on top of her refrigerator standing on some form of stool and she fell backwards. She hit her head and has a laceration type injury on the left lateral leg. She has chronic venous insufficiency but no major edema. She has stockings but does not use them. They've been using topical antibiotics 01/11/18; this is a patient I saw last week who had fallen and had a traumatic laceration on the left lower leg. We used silver alginate. She arrives in clinic today complaining of a lot more pain in the leg. There is some cognitive issues and is bit difficult to be certain what she is describing however it seems more localized to the left anterior ankle although even that required some assessment. Her husband showed me a picture on his cell phone showing some swelling in the left foot dorsally left ankle on Saturday. They state it is getting difficult for her to walk. I didn't hear anything about this last week. 01/19/18; x-rays we did last week of this patient's left ankle and left tib-fib were negative. There was no osteomyelitis. She is still anxious and complains of pain. We are using silver collagen In general she looks better this week there is still some minor edema Ariana [name], [name] ([id number]) Objective Constitutional Sitting or standing Blood Pressure is within target range for patient.Marland Kitchen  Respirations regular, non-labored and within target range.. Temperature is normal and within the target range for the patient.Marland Kitchen appears in no distress. Vitals Time Taken: 9:45 AM, Height: 66 in, Weight: 141.1 lbs, BMI: 22.8, Temperature: 98.4 F, Pulse: 58 bpm, Respiratory Rate: 18 breaths/min, Blood Pressure: 122/89 mmHg. Respiratory Respiratory effort is easy and symmetric bilaterally. Rate is normal at rest and on room air.. Cardiovascular Femoral arteries without bruits and pulses strong.. Pedal pulses palpable. Lymphatic none palpable in the left popliteal or inguinal area. Psychiatric Patient appears very anxious possibly with coexistent depression. General Notes: wound exam; the patient has improved looking wound surface. She has some surface slough but she is so anxious with any manipulation near the wound area no debridement. She has minor edema which seems to bother her great deal. Severe bilateral hemosiderin deposition compatible with her known venous insufficiency Integumentary (Hair, Skin) Wound #4 status is Open. Original cause of wound was Trauma. The wound is located on the Left,Anterior Lower Leg. The wound measures 1.6cm length x 2.1cm width x 0.1cm depth; 2.639cm^2 area and 0.264cm^3 volume. There is Fat Layer (Subcutaneous Tissue) Exposed exposed. There is no tunneling or undermining noted. There is a large amount of serosanguineous drainage noted. The wound margin is distinct with the outline attached to the wound base. There is medium (34-66%) red, pink, hyper - granulation within the wound bed. There is a medium (34-66%) amount of necrotic tissue within the wound bed including Adherent Slough. The periwound skin appearance exhibited: Excoriation, Maceration, Erythema. The periwound skin appearance did not  exhibit: Callus, Crepitus, Induration, Rash, Scarring, Dry/Scaly, Atrophie Blanche, Cyanosis, Ecchymosis, Hemosiderin Staining, Mottled, Pallor, Rubor. The  surrounding wound skin color is noted with erythema which is circumferential. Periwound temperature was noted as No Abnormality. The periwound has tenderness on palpation. Assessment Active Problems ICD-10 S81.812D - Laceration without foreign body, left lower leg, subsequent encounter L97.221 - Non-pressure chronic ulcer of left calf limited to breakdown of skin I87.301 - Chronic venous hypertension (idiopathic) without complications of right lower extremity D69.3 - Immune thrombocytopenic purpura S93.402D - Sprain of unspecified ligament of left ankle, subsequent encounter Ariana [name], [name] ([id number]) Plan Wound Cleansing: Wound #4 Left,Anterior Lower Leg: Clean wound with Normal Saline. Anesthetic (add to Medication List): Wound #4 Left,Anterior Lower Leg: Topical Lidocaine 4% cream applied to wound bed prior to debridement (In Clinic Only). Primary Wound Dressing: Wound #4 Left,Anterior Lower Leg: Silver Collagen - Left lower leg. Secondary Dressing: Wound #4 Left,Anterior Lower Leg: Drawtex - covered with Bordered Foam Dressing Dressing Change Frequency: Wound #4 Left,Anterior Lower Leg: Three times weekly Follow-up Appointments: Wound #4 Left,Anterior Lower Leg: Return Appointment in 1 week. Edema Control: Wound #4 Left,Anterior Lower Leg: Patient to wear own compression stockings Additional Orders / Instructions: Wound #4 Left,Anterior Lower Leg: Increase protein intake. #1Silver collagen to continue with drop attacks and foam cover #2 the left ankle with looks stable today, I think this is a lot of anxiety around this area. There was no evidence of fracture or severe soft tissue injury. Electronic Signature(s) Signed: 01/19/2018 5:02:04 PM By: Linton Ham MD Entered By: Linton Ham on 01/19/2018 10:37:22 Ariana [name] ([id number]) -------------------------------------------------------------------------------- SuperBill Details Patient Name:  Ariana [name] Date of Service: 01/19/2018 Medical Record Number: [id number] Patient Account Number: [id number] Date of Birth/Sex: [birth year] (84 y.o. F) Treating RN: Cornell Barman Primary Care Provider: Fulton Reek Other Clinician: Referring Provider: Fulton Reek Treating Provider/Extender: Tito Dine in Treatment: 2 Diagnosis Coding ICD-10 Codes Code Description [phone] Laceration without foreign body, left lower leg, subsequent encounter L97.221 Non-pressure chronic ulcer of left calf limited to breakdown of skin I87.301 Chronic venous hypertension (idiopathic) without complications of right lower extremity D69.3 Immune thrombocytopenic purpura S93.402D Sprain of unspecified ligament of left ankle, subsequent encounter Facility Procedures CPT4 Code: [id number] Description: [phone] - WOUND CARE VISIT-LEV 2 EST PT Modifier: Quantity: 1 Physician Procedures CPT4 Code Description: [id number] - WC PHYS LEVEL 3 - EST PT ICD-10 Diagnosis Description S81.812D Laceration without foreign body, left lower leg, subsequent e I87.301 Chronic venous hypertension (idiopathic) without complication Y56.389 Non-pressure  chronic ulcer of left calf limited to breakdown Modifier: ncounter s of right lower of skin Quantity: 1 extremity Electronic Signature(s) Signed: 01/19/2018 5:02:04 PM By: Linton Ham MD Entered By: Linton Ham on 01/19/2018 10:37:54

## 2018-01-23 NOTE — Progress Notes (Signed)
Ariana, [name] ([id number]) Visit Report for 01/19/2018 Arrival Information Details Patient Name: Ariana [name]. Date of Service: 01/19/2018 9:30 AM Medical Record Number: [id number] Patient Account Number: [id number] Date of Birth/Sex: 27-Feb-1933 (84 y.o. F) Treating RN: Montey Hora Primary Care Stephanie Littman: Fulton Reek Other Clinician: Referring Eiley Mcginnity: Fulton Reek Treating Saliou Barnier/Extender: Tito Dine in Treatment: 2 Visit Information History Since Last Visit Added or deleted any medications: No Patient Arrived: Ambulatory Any new allergies or adverse reactions: No Arrival Time: 09:44 Had a fall or experienced change in No Accompanied By: spouse activities of daily living that may affect Transfer Assistance: None risk of falls: Patient Identification Verified: Yes Signs or symptoms of abuse/neglect since last visito No Secondary Verification Process Yes Hospitalized since last visit: No Completed: Implantable device outside of the clinic excluding No Patient Requires Transmission-Based No cellular tissue based products placed in the center Precautions: since last visit: Patient Has Alerts: Yes Has Dressing in Place as Prescribed: Yes Patient Alerts: ABI done 09/15/17 Pain Present Now: Yes 0.79 Electronic Signature(s) Signed: 01/19/2018 5:03:40 PM By: Montey Hora Entered By: Montey Hora on 01/19/2018 09:45:15 Ariana [name] ([id number]) -------------------------------------------------------------------------------- Clinic Level of Care Assessment Details Patient Name: Ariana [name] Date of Service: 01/19/2018 9:30 AM Medical Record Number: [id number] Patient Account Number: [id number] Date of Birth/Sex: Jun 23, 1933 (84 y.o. F) Treating RN: Cornell Barman Primary Care Ayaka Andes: Fulton Reek Other Clinician: Referring Trasean Delima: Fulton Reek Treating Elden Brucato/Extender: Tito Dine in Treatment: 2 Clinic Level  of Care Assessment Items TOOL 4 Quantity Score []  - Use when only an EandM is performed on FOLLOW-UP visit 0 ASSESSMENTS - Nursing Assessment / Reassessment []  - Reassessment of Co-morbidities (includes updates in patient status) 0 X- 1 5 Reassessment of Adherence to Treatment Plan ASSESSMENTS - Wound and Skin Assessment / Reassessment X - Simple Wound Assessment / Reassessment - one wound 1 5 []  - 0 Complex Wound Assessment / Reassessment - multiple wounds []  - 0 Dermatologic / Skin Assessment (not related to wound area) ASSESSMENTS - Focused Assessment []  - Circumferential Edema Measurements - multi extremities 0 []  - 0 Nutritional Assessment / Counseling / Intervention []  - 0 Lower Extremity Assessment (monofilament, tuning fork, pulses) []  - 0 Peripheral Arterial Disease Assessment (using hand held doppler) ASSESSMENTS - Ostomy and/or Continence Assessment and Care []  - Incontinence Assessment and Management 0 []  - 0 Ostomy Care Assessment and Management (repouching, etc.) PROCESS - Coordination of Care X - Simple Patient / Family Education for ongoing care 1 15 []  - 0 Complex (extensive) Patient / Family Education for ongoing care []  - 0 Staff obtains Programmer, systems, Records, Test Results / Process Orders []  - 0 Staff telephones HHA, Nursing Homes / Clarify orders / etc []  - 0 Routine Transfer to another Facility (non-emergent condition) []  - 0 Routine Hospital Admission (non-emergent condition) []  - 0 New Admissions / Biomedical engineer / Ordering NPWT, Apligraf, etc. []  - 0 Emergency Hospital Admission (emergent condition) X- 1 10 Simple Discharge Coordination [name], [name]. ([id number]) []  - 0 Complex (extensive) Discharge Coordination PROCESS - Special Needs []  - Pediatric / Minor Patient Management 0 []  - 0 Isolation Patient Management []  - 0 Hearing / Language / Visual special needs []  - 0 Assessment of Community assistance (transportation, D/C  planning, etc.) []  - 0 Additional assistance / Altered mentation []  - 0 Support Surface(s) Assessment (bed, cushion, seat, etc.) INTERVENTIONS - Wound Cleansing / Measurement X - Simple Wound Cleansing - one wound 1 5 []  -  0 Complex Wound Cleansing - multiple wounds X- 1 5 Wound Imaging (photographs - any number of wounds) []  - 0 Wound Tracing (instead of photographs) X- 1 5 Simple Wound Measurement - one wound []  - 0 Complex Wound Measurement - multiple wounds INTERVENTIONS - Wound Dressings []  - Small Wound Dressing one or multiple wounds 0 X- 1 15 Medium Wound Dressing one or multiple wounds []  - 0 Large Wound Dressing one or multiple wounds []  - 0 Application of Medications - topical []  - 0 Application of Medications - injection INTERVENTIONS - Miscellaneous []  - External ear exam 0 []  - 0 Specimen Collection (cultures, biopsies, blood, body fluids, etc.) []  - 0 Specimen(s) / Culture(s) sent or taken to Lab for analysis []  - 0 Patient Transfer (multiple staff / Civil Service fast streamer / Similar devices) []  - 0 Simple Staple / Suture removal (25 or less) []  - 0 Complex Staple / Suture removal (26 or more) []  - 0 Hypo / Hyperglycemic Management (close monitor of Blood Glucose) []  - 0 Ankle / Brachial Index (ABI) - do not check if billed separately X- 1 5 Vital Signs [name], [name] ([id number]) Has the patient been seen at the hospital within the last three years: Yes Total Score: 70 Level Of Care: New/Established - Level 2 Electronic Signature(s) Signed: 01/19/2018 1:43:28 PM By: Gretta Cool, BSN, RN, CWS, Kim RN, BSN Entered By: Gretta Cool, BSN, RN, CWS, Kim on 01/19/2018 10:04:28 Ariana [name] ([id number]) -------------------------------------------------------------------------------- Encounter Discharge Information Details Patient Name: Ariana [name]. Date of Service: 01/19/2018 9:30 AM Medical Record Number: [id number] Patient Account Number: [id number] Date of  Birth/Sex: 03-09-1933 (84 y.o. F) Treating RN: Cornell Barman Primary Care Maico Mulvehill: Fulton Reek Other Clinician: Referring Kolbee Stallman: Fulton Reek Treating Diamonte Stavely/Extender: Tito Dine in Treatment: 2 Encounter Discharge Information Items Discharge Pain Level: 0 Discharge Condition: Stable Ambulatory Status: Ambulatory Discharge Destination: Home Transportation: Private Auto Accompanied By: self Schedule Follow-up Appointment: Yes Medication Reconciliation completed and Yes provided to Patient/Care Kyliee Ortego: Provided on Clinical Summary of Care: 01/19/2018 Form Type Recipient Paper Patient BI Electronic Signature(s) Signed: 01/21/2018 4:32:37 PM By: Ruthine Dose Entered By: Ruthine Dose on 01/19/2018 10:06:05 Ariana [name] ([id number]) -------------------------------------------------------------------------------- Lower Extremity Assessment Details Patient Name: Ariana [name]. Date of Service: 01/19/2018 9:30 AM Medical Record Number: [id number] Patient Account Number: [id number] Date of Birth/Sex: 11-03-1932 (84 y.o. F) Treating RN: Montey Hora Primary Care Elisa Sorlie: Fulton Reek Other Clinician: Referring Jani Ploeger: Fulton Reek Treating Jazma Pickel/Extender: Tito Dine in Treatment: 2 Vascular Assessment Pulses: Dorsalis Pedis Palpable: [Left:Yes] Posterior Tibial Extremity colors, hair growth, and conditions: Extremity Color: [Left:Hyperpigmented] Hair Growth on Extremity: [Left:No] Temperature of Extremity: [Left:Warm] Capillary Refill: [Left:< 3 seconds] Toe Nail Assessment Left: Right: Thick: Yes Discolored: No Deformed: No Improper Length and Hygiene: No Electronic Signature(s) Signed: 01/19/2018 5:03:40 PM By: Montey Hora Entered By: Montey Hora on 01/19/2018 09:49:14 Ariana [name] ([id number]) -------------------------------------------------------------------------------- Multi Wound Chart  Details Patient Name: Ariana [name]. Date of Service: 01/19/2018 9:30 AM Medical Record Number: [id number] Patient Account Number: [id number] Date of Birth/Sex: 01/15/1933 (84 y.o. F) Treating RN: Cornell Barman Primary Care Timberly Yott: Fulton Reek Other Clinician: Referring Jordynne Mccown: Fulton Reek Treating Alisandra Son/Extender: Tito Dine in Treatment: 2 Vital Signs Height(in): 66 Pulse(bpm): 58 Weight(lbs): 141.1 Blood Pressure(mmHg): 122/89 Body Mass Index(BMI): 23 Temperature(F): 98.4 Respiratory Rate 18 (breaths/min): Photos: [4:No Photos] [N/A:N/A] Wound Location: [4:Left Lower Leg - Anterior] [N/A:N/A] Wounding Event: [4:Trauma] [N/A:N/A] Primary Etiology: [4:Trauma, Other] [N/A:N/A] Comorbid History: [4:Cataracts, Osteoarthritis] [  N/A:N/A] Date Acquired: [4:12/31/2017] [N/A:N/A] Weeks of Treatment: [4:2] [N/A:N/A] Wound Status: [4:Open] [N/A:N/A] Measurements L x W x D [4:1.6x2.1x0.1] [N/A:N/A] (cm) Area (cm) : [4:2.639] [N/A:N/A] Volume (cm) : [4:0.264] [N/A:N/A] % Reduction in Area: [4:52.00%] [N/A:N/A] % Reduction in Volume: [4:52.00%] [N/A:N/A] Classification: [4:Full Thickness Without Exposed Support Structures] [N/A:N/A] Exudate Amount: [4:Large] [N/A:N/A] Exudate Type: [4:Serosanguineous] [N/A:N/A] Exudate Color: [4:red, brown] [N/A:N/A] Wound Margin: [4:Distinct, outline attached] [N/A:N/A] Granulation Amount: [4:Medium (34-66%)] [N/A:N/A] Granulation Quality: [4:Red, Pink, Hyper-granulation] [N/A:N/A] Necrotic Amount: [4:Medium (34-66%)] [N/A:N/A] Exposed Structures: [4:Fat Layer (Subcutaneous Tissue) Exposed: Yes Fascia: No Tendon: No Muscle: No Joint: No Bone: No] [N/A:N/A] Epithelialization: [4:None] [N/A:N/A] Periwound Skin Texture: [4:Excoriation: Yes Induration: No Callus: No Crepitus: No Rash: No Scarring: No] [N/A:N/A] Periwound Skin Moisture: Maceration: Yes N/A N/A Dry/Scaly: No Periwound Skin Color: Erythema: Yes N/A  N/A Atrophie Blanche: No Cyanosis: No Ecchymosis: No Hemosiderin Staining: No Mottled: No Pallor: No Rubor: No Erythema Location: Circumferential N/A N/A Temperature: No Abnormality N/A N/A Tenderness on Palpation: Yes N/A N/A Wound Preparation: Ulcer Cleansing: N/A N/A Rinsed/Irrigated with Saline Topical Anesthetic Applied: Other: lidocaine 4% Treatment Notes Wound #4 (Left, Anterior Lower Leg) 1. Cleansed with: Clean wound with Normal Saline 2. Anesthetic Topical Lidocaine 4% cream to wound bed prior to debridement 4. Dressing Applied: Prisma Ag 5. Secondary Dressing Applied Bordered Foam Dressing Electronic Signature(s) Signed: 01/19/2018 5:02:04 PM By: Linton Ham MD Entered By: Linton Ham on 01/19/2018 10:12:14 Ariana [name] ([id number]) -------------------------------------------------------------------------------- Multi-Disciplinary Care Plan Details Patient Name: Ariana [name] Date of Service: 01/19/2018 9:30 AM Medical Record Number: [id number] Patient Account Number: [id number] Date of Birth/Sex: June 27, 1933 (84 y.o. F) Treating RN: Cornell Barman Primary Care Crystalann Korf: Fulton Reek Other Clinician: Referring Mairany Bruno: Fulton Reek Treating Zarria Towell/Extender: Tito Dine in Treatment: 2 Active Inactive ` Abuse / Safety / Falls / Self Care Management Nursing Diagnoses: History of Falls Goals: Patient will not experience any injury related to falls Date Initiated: 01/05/2018 Target Resolution Date: 02/05/2018 Goal Status: Active Interventions: Assess fall risk on admission and as needed Notes: ` Orientation to the Wound Care Program Nursing Diagnoses: Knowledge deficit related to the wound healing center program Goals: Patient/caregiver will verbalize understanding of the [facility] Date Initiated: 01/05/2018 Target Resolution Date: 02/05/2018 Goal Status: Active Interventions: Provide education on  orientation to the wound center Notes: ` Wound/Skin Impairment Nursing Diagnoses: Impaired tissue integrity Goals: Ulcer/skin breakdown will have a volume reduction of 80% by week 12 Date Initiated: 01/05/2018 Target Resolution Date: 05/07/2018 Goal Status: Active Interventions: DORTHEA, MAINA ([id number]) Assess ulceration(s) every visit Notes: Electronic Signature(s) Signed: 01/19/2018 1:43:28 PM By: Gretta Cool, BSN, RN, CWS, Kim RN, BSN Entered By: Gretta Cool, BSN, RN, CWS, Kim on 01/19/2018 09:58:18 Ariana [name] ([id number]) -------------------------------------------------------------------------------- Pain Assessment Details Patient Name: Ariana [name]. Date of Service: 01/19/2018 9:30 AM Medical Record Number: [id number] Patient Account Number: [id number] Date of Birth/Sex: 1933-04-30 (84 y.o. F) Treating RN: Montey Hora Primary Care Charna Neeb: Fulton Reek Other Clinician: Referring Takao Lizer: Fulton Reek Treating Anays Detore/Extender: Tito Dine in Treatment: 2 Active Problems Location of Pain Severity and Description of Pain Patient Has Paino No Site Locations Pain Management and Medication Current Pain Management: Electronic Signature(s) Signed: 01/19/2018 5:03:40 PM By: Montey Hora Entered By: Montey Hora on 01/19/2018 09:45:23 Ariana [name] ([id number]) -------------------------------------------------------------------------------- Patient/Caregiver Education Details Patient Name: Ariana [name]. Date of Service: 01/19/2018 9:30 AM Medical Record Number: [id number] Patient Account Number: [id number] Date of Birth/Gender: [birth year] (84 y.o. F) Treating  RN: Cornell Barman Primary Care Physician: Fulton Reek Other Clinician: Referring Physician: Fulton Reek Treating Physician/Extender: Tito Dine in Treatment: 2 Education Assessment Education Provided To: Patient Education Topics Provided Wound/Skin  Impairment: Handouts: Caring for Your Ulcer Methods: Demonstration, Explain/Verbal Responses: State content correctly Electronic Signature(s) Signed: 01/19/2018 1:43:28 PM By: Gretta Cool, BSN, RN, CWS, Kim RN, BSN Entered By: Gretta Cool, BSN, RN, CWS, Kim on 01/19/2018 10:05:42 Ariana [name] ([id number]) -------------------------------------------------------------------------------- Wound Assessment Details Patient Name: Ariana [name] Date of Service: 01/19/2018 9:30 AM Medical Record Number: [id number] Patient Account Number: [id number] Date of Birth/Sex: 09-17-1933 (84 y.o. F) Treating RN: Montey Hora Primary Care Ondrea Dow: Fulton Reek Other Clinician: Referring Yuridiana Formanek: Fulton Reek Treating Catalea Labrecque/Extender: Tito Dine in Treatment: 2 Wound Status Wound Number: 4 Primary Etiology: Trauma, Other Wound Location: Left Lower Leg - Anterior Wound Status: Open Wounding Event: Trauma Comorbid History: Cataracts, Osteoarthritis Date Acquired: 12/31/2017 Weeks Of Treatment: 2 Clustered Wound: No Photos Photo Uploaded By: Montey Hora on 01/19/2018 11:52:40 Wound Measurements Length: (cm) 1.6 Width: (cm) 2.1 Depth: (cm) 0.1 Area: (cm) 2.639 Volume: (cm) 0.264 % Reduction in Area: 52% % Reduction in Volume: 52% Epithelialization: None Tunneling: No Undermining: No Wound Description Full Thickness Without Exposed Support Classification: Structures Wound Margin: Distinct, outline attached Exudate Large Amount: Exudate Type: Serosanguineous Exudate Color: red, brown Foul Odor After Cleansing: No Slough/Fibrino Yes Wound Bed Granulation Amount: Medium (34-66%) Exposed Structure Granulation Quality: Red, Pink, Hyper-granulation Fascia Exposed: No Necrotic Amount: Medium (34-66%) Fat Layer (Subcutaneous Tissue) Exposed: Yes Necrotic Quality: Adherent Slough Tendon Exposed: No Muscle Exposed: No Joint Exposed: No Bone Exposed:  No Rabine, Simara E. ([id number]) Periwound Skin Texture Texture Color No Abnormalities Noted: No No Abnormalities Noted: No Callus: No Atrophie Blanche: No Crepitus: No Cyanosis: No Excoriation: Yes Ecchymosis: No Induration: No Erythema: Yes Rash: No Erythema Location: Circumferential Scarring: No Hemosiderin Staining: No Mottled: No Moisture Pallor: No No Abnormalities Noted: No Rubor: No Dry / Scaly: No Maceration: Yes Temperature / Pain Temperature: No Abnormality Tenderness on Palpation: Yes Wound Preparation Ulcer Cleansing: Rinsed/Irrigated with Saline Topical Anesthetic Applied: Other: lidocaine 4%, Treatment Notes Wound #4 (Left, Anterior Lower Leg) 1. Cleansed with: Clean wound with Normal Saline 2. Anesthetic Topical Lidocaine 4% cream to wound bed prior to debridement 4. Dressing Applied: Prisma Ag 5. Secondary Dressing Applied Bordered Foam Dressing Electronic Signature(s) Signed: 01/19/2018 5:03:40 PM By: Montey Hora Entered By: Montey Hora on 01/19/2018 09:48:47 Ariana [name] ([id number]) -------------------------------------------------------------------------------- Vitals Details Patient Name: Ariana [name] Date of Service: 01/19/2018 9:30 AM Medical Record Number: [id number] Patient Account Number: [id number] Date of Birth/Sex: Mar 08, 1933 (84 y.o. F) Treating RN: Montey Hora Primary Care Tyan Dy: Fulton Reek Other Clinician: Referring Levy Wellman: Fulton Reek Treating Rakisha Pincock/Extender: Tito Dine in Treatment: 2 Vital Signs Time Taken: 09:45 Temperature (F): 98.4 Height (in): 66 Pulse (bpm): 58 Weight (lbs): 141.1 Respiratory Rate (breaths/min): 18 Body Mass Index (BMI): 22.8 Blood Pressure (mmHg): 122/89 Reference Range: 80 - 120 mg / dl Electronic Signature(s) Signed: 01/19/2018 5:03:40 PM By: Montey Hora Entered By: Montey Hora on 01/19/2018 09:45:51

## 2018-01-24 DIAGNOSIS — M5416 Radiculopathy, lumbar region: Secondary | ICD-10-CM | POA: Diagnosis not present

## 2018-01-24 DIAGNOSIS — M62830 Muscle spasm of back: Secondary | ICD-10-CM | POA: Diagnosis not present

## 2018-01-24 DIAGNOSIS — M5136 Other intervertebral disc degeneration, lumbar region: Secondary | ICD-10-CM | POA: Diagnosis not present

## 2018-01-24 DIAGNOSIS — D696 Thrombocytopenia, unspecified: Secondary | ICD-10-CM | POA: Diagnosis not present

## 2018-01-26 ENCOUNTER — Encounter: Payer: Medicare HMO | Admitting: Internal Medicine

## 2018-01-26 DIAGNOSIS — L97221 Non-pressure chronic ulcer of left calf limited to breakdown of skin: Secondary | ICD-10-CM | POA: Diagnosis not present

## 2018-01-26 DIAGNOSIS — S8011XA Contusion of right lower leg, initial encounter: Secondary | ICD-10-CM | POA: Diagnosis not present

## 2018-01-26 DIAGNOSIS — E1151 Type 2 diabetes mellitus with diabetic peripheral angiopathy without gangrene: Secondary | ICD-10-CM | POA: Diagnosis not present

## 2018-01-26 DIAGNOSIS — I87301 Chronic venous hypertension (idiopathic) without complications of right lower extremity: Secondary | ICD-10-CM | POA: Diagnosis not present

## 2018-01-26 DIAGNOSIS — S81812A Laceration without foreign body, left lower leg, initial encounter: Secondary | ICD-10-CM | POA: Diagnosis not present

## 2018-01-26 DIAGNOSIS — D693 Immune thrombocytopenic purpura: Secondary | ICD-10-CM | POA: Diagnosis not present

## 2018-01-26 DIAGNOSIS — E11622 Type 2 diabetes mellitus with other skin ulcer: Secondary | ICD-10-CM | POA: Diagnosis not present

## 2018-01-26 DIAGNOSIS — S81802A Unspecified open wound, left lower leg, initial encounter: Secondary | ICD-10-CM | POA: Diagnosis not present

## 2018-01-26 DIAGNOSIS — M1990 Unspecified osteoarthritis, unspecified site: Secondary | ICD-10-CM | POA: Diagnosis not present

## 2018-01-26 DIAGNOSIS — I872 Venous insufficiency (chronic) (peripheral): Secondary | ICD-10-CM | POA: Diagnosis not present

## 2018-01-27 DIAGNOSIS — F419 Anxiety disorder, unspecified: Secondary | ICD-10-CM | POA: Diagnosis not present

## 2018-01-27 DIAGNOSIS — F329 Major depressive disorder, single episode, unspecified: Secondary | ICD-10-CM | POA: Diagnosis not present

## 2018-01-27 DIAGNOSIS — M5416 Radiculopathy, lumbar region: Secondary | ICD-10-CM | POA: Diagnosis not present

## 2018-01-27 DIAGNOSIS — R51 Headache: Secondary | ICD-10-CM | POA: Diagnosis not present

## 2018-01-27 DIAGNOSIS — D696 Thrombocytopenia, unspecified: Secondary | ICD-10-CM | POA: Diagnosis not present

## 2018-01-27 DIAGNOSIS — F5104 Psychophysiologic insomnia: Secondary | ICD-10-CM | POA: Diagnosis not present

## 2018-01-27 DIAGNOSIS — M62830 Muscle spasm of back: Secondary | ICD-10-CM | POA: Diagnosis not present

## 2018-01-27 DIAGNOSIS — G301 Alzheimer's disease with late onset: Secondary | ICD-10-CM | POA: Diagnosis not present

## 2018-01-27 DIAGNOSIS — R0683 Snoring: Secondary | ICD-10-CM | POA: Diagnosis not present

## 2018-01-27 DIAGNOSIS — R2689 Other abnormalities of gait and mobility: Secondary | ICD-10-CM | POA: Diagnosis not present

## 2018-01-27 DIAGNOSIS — F0280 Dementia in other diseases classified elsewhere without behavioral disturbance: Secondary | ICD-10-CM | POA: Diagnosis not present

## 2018-01-31 NOTE — Progress Notes (Signed)
Ariana [name], Ariana [name] ([id number]) Visit Report for 01/26/2018 HPI Details Patient Name: Ariana [name], Ariana [name]. Date of Service: 01/26/2018 9:30 AM Medical Record Number: [id number] Patient Account Number: [id number] Date of Birth/Sex: [birth year] (84 y.o. F) Treating RN: Cornell Barman Primary Care Provider: Fulton Reek Other Clinician: Referring Provider: Fulton Reek Treating Provider/Extender: Tito Dine in Treatment: 3 History of Present Illness HPI Description: 09/15/17; this is an 84 year old woman with a history of type 2 diabetes, chronic ITP for which she follows with oncology. She had a splenectomy in 2007. 3 weeks ago well sitting in the bathroom she fell off the toilet and hit her legs on the bathtub. She had 2 large hematomas. The area on the left has ruptured into a wound where the area on the right medial lower leg is actually still intact. She was given Keflex of concern for coexistent cellulitis on 11/28 the area on the right is still intact and she is not doing anything specific for that. She has a history of small hematomas related to thrombocytopenia on her lower legs although these all seem to have primarily healed. She has a history of falls 09/22/17; this is a patient who had traumatic hematomas on both her legs. When we admitted to clinic last week the left one had already evacuated whereas the right one had not. She arrived in clinic today with drainage out of the right side. We used Hydrofera Blue to the left 09/29/17; this is a patient who had a traumatic hematoma on both her legs. She has chronic ITP and states she bruises very easily. The right leg hematoma evacuated last week. We have been using Hydrofera Blue to both wound areas on both anterior legs. Today the one on the left is slightly hyper-granulated if the wound stalls we may need to address this. Otherwise the wounds are smaller and look healthy today 10/06/17; traumatic hematomas on both her  legs left greater than right. She has chronic venous insufficiency also chronic ITP. We have been using Hydrofera Blue to both of these wound areas. She has new wounds on the right lateral leg caused by her dog 10/20/17; traumatic hematomas on both her legs left greater than right which is left her with chronic bilateral wounds. She has chronic venous insufficiency also chronic ITP. We've been using Hydrofera Blue to both of these wound areas largely because of hyperventilation. The right lateral leg wound was caused last visit by her dog. The left leg wound was caused by trauma against her bathtub. 10/27/17; these were initially traumatic hematomas on both her legs left greater than right. She hyper-granulation last week and I used silver nitrate. We've been using Hydrofera Blue to the wounds. Much better today especially on the left READMISSION 01/05/18; this is a patient I cared for late in 2018 and into 2019. She had traumatic hematomas on both anterior legs in the setting of chronic ITP. She has had a splenectomy.I have her listed as a type II diabetic although that may be an error. She denies this. In any case she states the wounds from last time" on their own and indeed both the areas seem to appeal today. The current problem started from 12/31/17; she was cleaning on top of her refrigerator standing on some form of stool and she fell backwards. She hit her head and has a laceration type injury on the left lateral leg. She has chronic venous insufficiency but no major edema. She has stockings but does not use them. They've been  using topical antibiotics 01/11/18; this is a patient I saw last week who had fallen and had a traumatic laceration on the left lower leg. We used silver alginate. She arrives in clinic today complaining of a lot more pain in the leg. There is some cognitive issues and is bit difficult to be certain what she is describing however it seems more localized to the left anterior  ankle although even that required some assessment. Her husband showed me a picture on his cell phone showing some swelling in the left foot dorsally left ankle on Saturday. They state it is getting difficult for her to walk. I didn't hear anything about this last week. 01/19/18; x-rays we did last week of this patient's left ankle and left tib-fib were negative. There was no osteomyelitis. She is still anxious and complains of pain. We are using silver collagen In general she looks better this week there is still some minor edema 01/26/18; we have been using silver collagen. The area looked wet to our intake nurse. Wounds are minor. This is on the anterior left lower leg. She has severe bilateral chronic venous insufficiency with hemosiderin deposition. Not much in the way of edema however. She does not wear stockings reliably Ariana [name], Ariana [name] ([id number]) Electronic Signature(s) Signed: 01/26/2018 5:09:03 PM By: Linton Ham MD Entered By: Linton Ham on 01/26/2018 09:45:07 Ariana [name] ([id number]) -------------------------------------------------------------------------------- Physical Exam Details Patient Name: Ariana [name] Date of Service: 01/26/2018 9:30 AM Medical Record Number: [id number] Patient Account Number: [id number] Date of Birth/Sex: 1933-09-10 (84 y.o. F) Treating RN: Cornell Barman Primary Care Provider: Fulton Reek Other Clinician: Referring Provider: Fulton Reek Treating Provider/Extender: Tito Dine in Treatment: 3 Constitutional Sitting or standing Blood Pressure is within target range for patient.. Pulse regular and within target range for patient.Marland Kitchen Respirations regular, non-labored and within target range.. Temperature is normal and within the target range for the patient.Marland Kitchen appears in no distress. Eyes Conjunctivae clear. No discharge. Cardiovascular Femoral arteries without bruits and pulses strong.. Pedal pulses palpable  and strong bilaterally.. there is no edema. Lymphatic none palpable in the popliteal area. Integumentary (Hair, Skin) severe bilateral hemosiderin deposition very fragile skin but no other skin issues seen. Notes wound exam; the patient continues to have an improved looking wound surface. Only 2 small open areas remaining. Granulation looks healthy here. There is no evidence of surrounding infection. She has severe bilateral venous insufficiency with hemosiderin deposition and very fragile skin but not much in the way of edema. She does not wear compression stockings reliably Electronic Signature(s) Signed: 01/26/2018 5:09:03 PM By: Linton Ham MD Entered By: Linton Ham on 01/26/2018 09:47:30 Ariana [name] ([id number]) -------------------------------------------------------------------------------- Physician Orders Details Patient Name: Ariana [name] Date of Service: 01/26/2018 9:30 AM Medical Record Number: [id number] Patient Account Number: [id number] Date of Birth/Sex: 07-Oct-1933 (84 y.o. F) Treating RN: Cornell Barman Primary Care Provider: Fulton Reek Other Clinician: Referring Provider: Fulton Reek Treating Provider/Extender: Tito Dine in Treatment: 3 Verbal / Phone Orders: No Diagnosis Coding Wound Cleansing Wound #4 Left,Anterior Lower Leg o Clean wound with Normal Saline. Anesthetic (add to Medication List) Wound #4 Left,Anterior Lower Leg o Topical Lidocaine 4% cream applied to wound bed prior to debridement (In Clinic Only). Primary Wound Dressing Wound #4 Left,Anterior Lower Leg o Silver Alginate Secondary Dressing Wound #4 Left,Anterior Lower Leg o Boardered Foam Dressing Dressing Change Frequency Wound #4 Left,Anterior Lower Leg o Three times weekly Follow-up Appointments Wound #4 Left,Anterior Lower Leg o  Return Appointment in 1 week. Edema Control Wound #4 Left,Anterior Lower Leg o Patient to wear own  compression stockings Additional Orders / Instructions Wound #4 Left,Anterior Lower Leg o Increase protein intake. Electronic Signature(s) Signed: 01/26/2018 5:09:03 PM By: Linton Ham MD Signed: 01/26/2018 5:11:52 PM By: Gretta Cool, BSN, RN, CWS, Kim RN, BSN Entered By: Gretta Cool, BSN, RN, CWS, Kim on 01/26/2018 09:42:15 Ariana [name] ([id number]) -------------------------------------------------------------------------------- Problem List Details Patient Name: Ariana [name] Date of Service: 01/26/2018 9:30 AM Medical Record Number: [id number] Patient Account Number: [id number] Date of Birth/Sex: 06-27-1933 (84 y.o. F) Treating RN: Cornell Barman Primary Care Provider: Fulton Reek Other Clinician: Referring Provider: Fulton Reek Treating Provider/Extender: Tito Dine in Treatment: 3 Active Problems ICD-10 Impacting Encounter Code Description Active Date Wound Healing Diagnosis S81.812D Laceration without foreign body, left lower leg, subsequent 01/05/2018 Yes encounter L97.221 Non-pressure chronic ulcer of left calf limited to breakdown of 01/05/2018 Yes skin I87.301 Chronic venous hypertension (idiopathic) without 01/19/7352 Yes complications of right lower extremity D69.3 Immune thrombocytopenic purpura 01/05/2018 Yes S93.402D Sprain of unspecified ligament of left ankle, subsequent 01/12/2018 Yes encounter Inactive Problems Resolved Problems Electronic Signature(s) Signed: 01/26/2018 5:09:03 PM By: Linton Ham MD Entered By: Linton Ham on 01/26/2018 09:44:02 Ariana [name] ([id number]) -------------------------------------------------------------------------------- Progress Note Details Patient Name: Ariana [name] Date of Service: 01/26/2018 9:30 AM Medical Record Number: [id number] Patient Account Number: [id number] Date of Birth/Sex: 1933-01-23 (84 y.o. F) Treating RN: Cornell Barman Primary Care Provider: Fulton Reek Other  Clinician: Referring Provider: Fulton Reek Treating Provider/Extender: Tito Dine in Treatment: 3 Subjective History of Present Illness (HPI) 09/15/17; this is an 84 year old woman with a history of type 2 diabetes, chronic ITP for which she follows with oncology. She had a splenectomy in 2007. 3 weeks ago well sitting in the bathroom she fell off the toilet and hit her legs on the bathtub. She had 2 large hematomas. The area on the left has ruptured into a wound where the area on the right medial lower leg is actually still intact. She was given Keflex of concern for coexistent cellulitis on 11/28 the area on the right is still intact and she is not doing anything specific for that. She has a history of small hematomas related to thrombocytopenia on her lower legs although these all seem to have primarily healed. She has a history of falls 09/22/17; this is a patient who had traumatic hematomas on both her legs. When we admitted to clinic last week the left one had already evacuated whereas the right one had not. She arrived in clinic today with drainage out of the right side. We used Hydrofera Blue to the left 09/29/17; this is a patient who had a traumatic hematoma on both her legs. She has chronic ITP and states she bruises very easily. The right leg hematoma evacuated last week. We have been using Hydrofera Blue to both wound areas on both anterior legs. Today the one on the left is slightly hyper-granulated if the wound stalls we may need to address this. Otherwise the wounds are smaller and look healthy today 10/06/17; traumatic hematomas on both her legs left greater than right. She has chronic venous insufficiency also chronic ITP. We have been using Hydrofera Blue to both of these wound areas. She has new wounds on the right lateral leg caused by her dog 10/20/17; traumatic hematomas on both her legs left greater than right which is left her with chronic bilateral  wounds. She has chronic  venous insufficiency also chronic ITP. We've been using Hydrofera Blue to both of these wound areas largely because of hyperventilation. The right lateral leg wound was caused last visit by her dog. The left leg wound was caused by trauma against her bathtub. 10/27/17; these were initially traumatic hematomas on both her legs left greater than right. She hyper-granulation last week and I used silver nitrate. We've been using Hydrofera Blue to the wounds. Much better today especially on the left READMISSION 01/05/18; this is a patient I cared for late in 2018 and into 2019. She had traumatic hematomas on both anterior legs in the setting of chronic ITP. She has had a splenectomy.I have her listed as a type II diabetic although that may be an error. She denies this. In any case she states the wounds from last time" on their own and indeed both the areas seem to appeal today. The current problem started from 12/31/17; she was cleaning on top of her refrigerator standing on some form of stool and she fell backwards. She hit her head and has a laceration type injury on the left lateral leg. She has chronic venous insufficiency but no major edema. She has stockings but does not use them. They've been using topical antibiotics 01/11/18; this is a patient I saw last week who had fallen and had a traumatic laceration on the left lower leg. We used silver alginate. She arrives in clinic today complaining of a lot more pain in the leg. There is some cognitive issues and is bit difficult to be certain what she is describing however it seems more localized to the left anterior ankle although even that required some assessment. Her husband showed me a picture on his cell phone showing some swelling in the left foot dorsally left ankle on Saturday. They state it is getting difficult for her to walk. I didn't hear anything about this last week. 01/19/18; x-rays we did last week of this patient's  left ankle and left tib-fib were negative. There was no osteomyelitis. She is still anxious and complains of pain. We are using silver collagen In general she looks better this week there is still some minor edema 01/26/18; we have been using silver collagen. The area looked wet to our intake nurse. Wounds are minor. This is on the anterior left lower leg. She has severe bilateral chronic venous insufficiency with hemosiderin deposition. Not much in the way of edema however. She does not wear stockings reliably Platter, Tannya E. ([id number]) Objective Constitutional Sitting or standing Blood Pressure is within target range for patient.. Pulse regular and within target range for patient.Marland Kitchen Respirations regular, non-labored and within target range.. Temperature is normal and within the target range for the patient.Marland Kitchen appears in no distress. Vitals Time Taken: 9:30 AM, Height: 66 in, Weight: 141.1 lbs, BMI: 22.8, Temperature: 98.3 F, Pulse: 59 bpm, Respiratory Rate: 18 breaths/min, Blood Pressure: 138/55 mmHg. Eyes Conjunctivae clear. No discharge. Cardiovascular Femoral arteries without bruits and pulses strong.. Pedal pulses palpable and strong bilaterally.. there is no edema. Lymphatic none palpable in the popliteal area. General Notes: wound exam; the patient continues to have an improved looking wound surface. Only 2 small open areas remaining. Granulation looks healthy here. There is no evidence of surrounding infection. She has severe bilateral venous insufficiency with hemosiderin deposition and very fragile skin but not much in the way of edema. She does not wear compression stockings reliably Integumentary (Hair, Skin) severe bilateral hemosiderin deposition very fragile skin but no other  skin issues seen. Wound #4 status is Open. Original cause of wound was Trauma. The wound is located on the Left,Anterior Lower Leg. The wound measures 1.5cm length x 1.6cm width x 0.1cm depth;  1.885cm^2 area and 0.188cm^3 volume. There is Fat Layer (Subcutaneous Tissue) Exposed exposed. There is no tunneling or undermining noted. There is a large amount of serosanguineous drainage noted. The wound margin is distinct with the outline attached to the wound base. There is medium (34-66%) red, pink, hyper - granulation within the wound bed. There is a medium (34-66%) amount of necrotic tissue within the wound bed including Adherent Slough. The periwound skin appearance exhibited: Excoriation, Maceration, Erythema. The periwound skin appearance did not exhibit: Callus, Crepitus, Induration, Rash, Scarring, Dry/Scaly, Atrophie Blanche, Cyanosis, Ecchymosis, Hemosiderin Staining, Mottled, Pallor, Rubor. The surrounding wound skin color is noted with erythema which is circumferential. Periwound temperature was noted as No Abnormality. The periwound has tenderness on palpation. Assessment Active Problems ICD-10 S81.812D - Laceration without foreign body, left lower leg, subsequent encounter L97.221 - Non-pressure chronic ulcer of left calf limited to breakdown of skin I87.301 - Chronic venous hypertension (idiopathic) without complications of right lower extremity D69.3 - Immune thrombocytopenic purpura [place], Teriann E. ([id number]) S93.402D - Sprain of unspecified ligament of left ankle, subsequent encounter Plan Wound Cleansing: Wound #4 Left,Anterior Lower Leg: Clean wound with Normal Saline. Anesthetic (add to Medication List): Wound #4 Left,Anterior Lower Leg: Topical Lidocaine 4% cream applied to wound bed prior to debridement (In Clinic Only). Primary Wound Dressing: Wound #4 Left,Anterior Lower Leg: Silver Alginate Secondary Dressing: Wound #4 Left,Anterior Lower Leg: Boardered Foam Dressing Dressing Change Frequency: Wound #4 Left,Anterior Lower Leg: Three times weekly Follow-up Appointments: Wound #4 Left,Anterior Lower Leg: Return Appointment in 1 week. Edema  Control: Wound #4 Left,Anterior Lower Leg: Patient to wear own compression stockings Additional Orders / Instructions: Wound #4 Left,Anterior Lower Leg: Increase protein intake. #1 I change primary dressing from [place] in the silver alginate to see if we can dry this area up and promote some closure. #2 there is no evidence of surrounding infection no erythema no tenderness she is not spontaneously complaining of pain Electronic Signature(s) Signed: 01/26/2018 5:09:03 PM By: Linton Ham MD Entered By: Linton Ham on 01/26/2018 09:48:16 Ariana [name] ([id number]) -------------------------------------------------------------------------------- SuperBill Details Patient Name: Ariana [name]. Date of Service: 01/26/2018 Medical Record Number: [id number] Patient Account Number: [id number] Date of Birth/Sex: Jun 14, 1933 (84 y.o. F) Treating RN: Cornell Barman Primary Care Provider: Fulton Reek Other Clinician: Referring Provider: Fulton Reek Treating Provider/Extender: Tito Dine in Treatment: 3 Diagnosis Coding ICD-10 Codes Code Description [phone] Laceration without foreign body, left lower leg, subsequent encounter L97.221 Non-pressure chronic ulcer of left calf limited to breakdown of skin I87.301 Chronic venous hypertension (idiopathic) without complications of right lower extremity D69.3 Immune thrombocytopenic purpura S93.402D Sprain of unspecified ligament of left ankle, subsequent encounter Facility Procedures CPT4 Code: [id number] Description: [phone] - WOUND CARE VISIT-LEV 2 EST PT Modifier: Quantity: 1 Physician Procedures CPT4 Code: [id number] Description: 74259 - WC PHYS LEVEL 3 - EST PT ICD-10 Diagnosis Description L97.221 Non-pressure chronic ulcer of left calf limited to breakdo S81.812D Laceration without foreign body, left lower leg, subsequen Modifier: wn of skin t encounter Quantity: 1 Electronic Signature(s) Signed: 01/26/2018  5:09:03 PM By: Linton Ham MD Entered By: Linton Ham on 01/26/2018 09:48:42

## 2018-02-01 DIAGNOSIS — R05 Cough: Secondary | ICD-10-CM | POA: Diagnosis not present

## 2018-02-02 ENCOUNTER — Encounter: Payer: Medicare HMO | Admitting: Internal Medicine

## 2018-02-02 DIAGNOSIS — M1990 Unspecified osteoarthritis, unspecified site: Secondary | ICD-10-CM | POA: Diagnosis not present

## 2018-02-02 DIAGNOSIS — E11622 Type 2 diabetes mellitus with other skin ulcer: Secondary | ICD-10-CM | POA: Diagnosis not present

## 2018-02-02 DIAGNOSIS — I87301 Chronic venous hypertension (idiopathic) without complications of right lower extremity: Secondary | ICD-10-CM | POA: Diagnosis not present

## 2018-02-02 DIAGNOSIS — L97221 Non-pressure chronic ulcer of left calf limited to breakdown of skin: Secondary | ICD-10-CM | POA: Diagnosis not present

## 2018-02-02 DIAGNOSIS — S81812A Laceration without foreign body, left lower leg, initial encounter: Secondary | ICD-10-CM | POA: Diagnosis not present

## 2018-02-02 DIAGNOSIS — D693 Immune thrombocytopenic purpura: Secondary | ICD-10-CM | POA: Diagnosis not present

## 2018-02-02 DIAGNOSIS — I872 Venous insufficiency (chronic) (peripheral): Secondary | ICD-10-CM | POA: Diagnosis not present

## 2018-02-02 DIAGNOSIS — S81802A Unspecified open wound, left lower leg, initial encounter: Secondary | ICD-10-CM | POA: Diagnosis not present

## 2018-02-02 DIAGNOSIS — E1151 Type 2 diabetes mellitus with diabetic peripheral angiopathy without gangrene: Secondary | ICD-10-CM | POA: Diagnosis not present

## 2018-02-02 DIAGNOSIS — S8011XA Contusion of right lower leg, initial encounter: Secondary | ICD-10-CM | POA: Diagnosis not present

## 2018-02-02 NOTE — Progress Notes (Signed)
ELARA, COCKE ([id number]) Visit Report for 01/26/2018 Arrival Information Details Patient Name: Ariana [name], Ariana [name]. Date of Service: 01/26/2018 9:30 AM Medical Record Number: [id number] Patient Account Number: [id number] Date of Birth/Sex: Aug 07, 1933 (84 y.o. F) Treating RN: Ahmed Prima Primary Care Eleny Cortez: Fulton Reek Other Clinician: Referring Sylvanna Burggraf: Fulton Reek Treating Mashal Slavick/Extender: Tito Dine in Treatment: 3 Visit Information History Since Last Visit All ordered tests and consults were completed: No Patient Arrived: Ambulatory Added or deleted any medications: No Arrival Time: 09:29 Any new allergies or adverse reactions: No Accompanied By: spouse Had a fall or experienced change in No Transfer Assistance: None activities of daily living that may affect Patient Identification Verified: Yes risk of falls: Secondary Verification Process Yes Signs or symptoms of abuse/neglect since last visito No Completed: Hospitalized since last visit: No Patient Requires Transmission-Based No Implantable device outside of the clinic excluding No Precautions: cellular tissue based products placed in the center Patient Has Alerts: Yes since last visit: Patient Alerts: ABI done 09/15/17 Has Dressing in Place as Prescribed: Yes 0.79 Pain Present Now: No Electronic Signature(s) Signed: 01/27/2018 5:13:37 PM By: Alric Quan Entered By: Alric Quan on 01/26/2018 09:29:31 Ariana [name] ([id number]) -------------------------------------------------------------------------------- Clinic Level of Care Assessment Details Patient Name: Ariana [name] Date of Service: 01/26/2018 9:30 AM Medical Record Number: [id number] Patient Account Number: [id number] Date of Birth/Sex: [birth year] (84 y.o. F) Treating RN: Cornell Barman Primary Care Tiana Sivertson: Fulton Reek Other Clinician: Referring Gracee Ratterree: Fulton Reek Treating Sherif Millspaugh/Extender:  Tito Dine in Treatment: 3 Clinic Level of Care Assessment Items TOOL 4 Quantity Score []  - Use when only an EandM is performed on FOLLOW-UP visit 0 ASSESSMENTS - Nursing Assessment / Reassessment []  - Reassessment of Co-morbidities (includes updates in patient status) 0 X- 1 5 Reassessment of Adherence to Treatment Plan ASSESSMENTS - Wound and Skin Assessment / Reassessment X - Simple Wound Assessment / Reassessment - one wound 1 5 []  - 0 Complex Wound Assessment / Reassessment - multiple wounds []  - 0 Dermatologic / Skin Assessment (not related to wound area) ASSESSMENTS - Focused Assessment []  - Circumferential Edema Measurements - multi extremities 0 []  - 0 Nutritional Assessment / Counseling / Intervention []  - 0 Lower Extremity Assessment (monofilament, tuning fork, pulses) []  - 0 Peripheral Arterial Disease Assessment (using hand held doppler) ASSESSMENTS - Ostomy and/or Continence Assessment and Care []  - Incontinence Assessment and Management 0 []  - 0 Ostomy Care Assessment and Management (repouching, etc.) PROCESS - Coordination of Care X - Simple Patient / Family Education for ongoing care 1 15 []  - 0 Complex (extensive) Patient / Family Education for ongoing care []  - 0 Staff obtains Programmer, systems, Records, Test Results / Process Orders []  - 0 Staff telephones HHA, Nursing Homes / Clarify orders / etc []  - 0 Routine Transfer to another Facility (non-emergent condition) []  - 0 Routine Hospital Admission (non-emergent condition) []  - 0 New Admissions / Biomedical engineer / Ordering NPWT, Apligraf, etc. []  - 0 Emergency Hospital Admission (emergent condition) X- 1 10 Simple Discharge Coordination AMEY, HOSSAIN. ([id number]) []  - 0 Complex (extensive) Discharge Coordination PROCESS - Special Needs []  - Pediatric / Minor Patient Management 0 []  - 0 Isolation Patient Management []  - 0 Hearing / Language / Visual special needs []  -  0 Assessment of Community assistance (transportation, D/C planning, etc.) []  - 0 Additional assistance / Altered mentation []  - 0 Support Surface(s) Assessment (bed, cushion, seat, etc.) INTERVENTIONS - Wound Cleansing / Measurement X -  Simple Wound Cleansing - one wound 1 5 []  - 0 Complex Wound Cleansing - multiple wounds X- 1 5 Wound Imaging (photographs - any number of wounds) []  - 0 Wound Tracing (instead of photographs) X- 1 5 Simple Wound Measurement - one wound []  - 0 Complex Wound Measurement - multiple wounds INTERVENTIONS - Wound Dressings []  - Small Wound Dressing one or multiple wounds 0 X- 1 15 Medium Wound Dressing one or multiple wounds []  - 0 Large Wound Dressing one or multiple wounds []  - 0 Application of Medications - topical []  - 0 Application of Medications - injection INTERVENTIONS - Miscellaneous []  - External ear exam 0 []  - 0 Specimen Collection (cultures, biopsies, blood, body fluids, etc.) []  - 0 Specimen(s) / Culture(s) sent or taken to Lab for analysis []  - 0 Patient Transfer (multiple staff / Civil Service fast streamer / Similar devices) []  - 0 Simple Staple / Suture removal (25 or less) []  - 0 Complex Staple / Suture removal (26 or more) []  - 0 Hypo / Hyperglycemic Management (close monitor of Blood Glucose) []  - 0 Ankle / Brachial Index (ABI) - do not check if billed separately X- 1 5 Vital Signs Klunder, Emeree E. ([id number]) Has the patient been seen at the hospital within the last three years: Yes Total Score: 70 Level Of Care: New/Established - Level 2 Electronic Signature(s) Signed: 01/26/2018 5:11:52 PM By: Gretta Cool, BSN, RN, CWS, Kim RN, BSN Entered By: Gretta Cool, BSN, RN, CWS, Kim on 01/26/2018 09:42:41 Ariana [name] ([id number]) -------------------------------------------------------------------------------- Encounter Discharge Information Details Patient Name: Ariana [name]. Date of Service: 01/26/2018 9:30 AM Medical Record  Number: [id number] Patient Account Number: [id number] Date of Birth/Sex: 02-22-1933 (84 y.o. F) Treating RN: Cornell Barman Primary Care Jacqualynn Parco: Fulton Reek Other Clinician: Referring Naftali Carchi: Fulton Reek Treating Joy Reiger/Extender: Tito Dine in Treatment: 3 Encounter Discharge Information Items Discharge Pain Level: 0 Discharge Condition: Stable Ambulatory Status: Ambulatory Discharge Destination: Home Transportation: Private Auto Accompanied By: husband Schedule Follow-up Appointment: Yes Medication Reconciliation completed and Yes provided to Patient/Care Nannette Zill: Provided on Clinical Summary of Care: 01/26/2018 Form Type Recipient Paper Patient BI Electronic Signature(s) Signed: 01/28/2018 3:53:00 PM By: Ruthine Dose Entered By: Ruthine Dose on 01/26/2018 09:48:07 Ariana [name] ([id number]) -------------------------------------------------------------------------------- Lower Extremity Assessment Details Patient Name: Ariana [name]. Date of Service: 01/26/2018 9:30 AM Medical Record Number: [id number] Patient Account Number: [id number] Date of Birth/Sex: [birth year] (84 y.o. F) Treating RN: Ahmed Prima Primary Care Roth Ress: Fulton Reek Other Clinician: Referring Sam Overbeck: Fulton Reek Treating Kymber Kosar/Extender: Tito Dine in Treatment: 3 Vascular Assessment Pulses: Dorsalis Pedis Palpable: [Left:Yes] Posterior Tibial Extremity colors, hair growth, and conditions: Extremity Color: [Left:Hyperpigmented] Temperature of Extremity: [Left:Warm] Capillary Refill: [Left:< 3 seconds] Toe Nail Assessment Left: Right: Thick: Yes Discolored: No Deformed: No Improper Length and Hygiene: No Electronic Signature(s) Signed: 01/27/2018 5:13:37 PM By: Alric Quan Entered By: Alric Quan on 01/26/2018 09:35:10 Ariana [name]  ([id number]) -------------------------------------------------------------------------------- Multi Wound Chart Details Patient Name: Ariana [name]. Date of Service: 01/26/2018 9:30 AM Medical Record Number: [id number] Patient Account Number: [id number] Date of Birth/Sex: 03-25-1933 (84 y.o. F) Treating RN: Cornell Barman Primary Care Lannah Koike: Fulton Reek Other Clinician: Referring Kareena Arrambide: Fulton Reek Treating Liliani Bobo/Extender: Tito Dine in Treatment: 3 Vital Signs Height(in): 66 Pulse(bpm): 59 Weight(lbs): 141.1 Blood Pressure(mmHg): 138/55 Body Mass Index(BMI): 23 Temperature(F): 98.3 Respiratory Rate 18 (breaths/min): Photos: [4:No Photos] [N/A:N/A] Wound Location: [4:Left Lower Leg - Anterior] [N/A:N/A] Wounding Event: [4:Trauma] [N/A:N/A] Primary Etiology: [4:Trauma, Other] [  N/A:N/A] Comorbid History: [4:Cataracts, Osteoarthritis] [N/A:N/A] Date Acquired: [4:12/31/2017] [N/A:N/A] Weeks of Treatment: [4:3] [N/A:N/A] Wound Status: [4:Open] [N/A:N/A] Measurements L x W x D [4:1.5x1.6x0.1] [N/A:N/A] (cm) Area (cm) : [4:1.885] [N/A:N/A] Volume (cm) : [4:0.188] [N/A:N/A] % Reduction in Area: [4:65.70%] [N/A:N/A] % Reduction in Volume: [4:65.80%] [N/A:N/A] Classification: [4:Full Thickness Without Exposed Support Structures] [N/A:N/A] Exudate Amount: [4:Large] [N/A:N/A] Exudate Type: [4:Serosanguineous] [N/A:N/A] Exudate Color: [4:red, brown] [N/A:N/A] Wound Margin: [4:Distinct, outline attached] [N/A:N/A] Granulation Amount: [4:Medium (34-66%)] [N/A:N/A] Granulation Quality: [4:Red, Pink, Hyper-granulation] [N/A:N/A] Necrotic Amount: [4:Medium (34-66%)] [N/A:N/A] Exposed Structures: [4:Fat Layer (Subcutaneous Tissue) Exposed: Yes Fascia: No Tendon: No Muscle: No Joint: No Bone: No] [N/A:N/A] Epithelialization: [4:None] [N/A:N/A] Periwound Skin Texture: [4:Excoriation: Yes Induration: No Callus: No Crepitus: No Rash: No Scarring: No]  [N/A:N/A] Periwound Skin Moisture: Maceration: Yes N/A N/A Dry/Scaly: No Periwound Skin Color: Erythema: Yes N/A N/A Atrophie Blanche: No Cyanosis: No Ecchymosis: No Hemosiderin Staining: No Mottled: No Pallor: No Rubor: No Erythema Location: Circumferential N/A N/A Temperature: No Abnormality N/A N/A Tenderness on Palpation: Yes N/A N/A Wound Preparation: Ulcer Cleansing: N/A N/A Rinsed/Irrigated with Saline Topical Anesthetic Applied: Other: lidocaine 4% Treatment Notes Wound #4 (Left, Anterior Lower Leg) 1. Cleansed with: Clean wound with Normal Saline 2. Anesthetic Topical Lidocaine 4% cream to wound bed prior to debridement 4. Dressing Applied: Other dressing (specify in notes) 5. Secondary Dressing Applied Bordered Foam Dressing Notes S. Cell Electronic Signature(s) Signed: 01/26/2018 5:09:03 PM By: Linton Ham MD Entered By: Linton Ham on 01/26/2018 09:44:13 Ariana [name] ([id number]) -------------------------------------------------------------------------------- Multi-Disciplinary Care Plan Details Patient Name: Ariana [name] Date of Service: 01/26/2018 9:30 AM Medical Record Number: [id number] Patient Account Number: [id number] Date of Birth/Sex: 07-16-1933 (84 y.o. F) Treating RN: Cornell Barman Primary Care Keelia Graybill: Fulton Reek Other Clinician: Referring Samina Weekes: Fulton Reek Treating Janiyha Montufar/Extender: Tito Dine in Treatment: 3 Active Inactive ` Abuse / Safety / Falls / Self Care Management Nursing Diagnoses: History of Falls Goals: Patient will not experience any injury related to falls Date Initiated: 01/05/2018 Target Resolution Date: 02/05/2018 Goal Status: Active Interventions: Assess fall risk on admission and as needed Notes: ` Orientation to the Wound Care Program Nursing Diagnoses: Knowledge deficit related to the wound healing center program Goals: Patient/caregiver will verbalize understanding  of the [place] Date Initiated: 01/05/2018 Target Resolution Date: 02/05/2018 Goal Status: Active Interventions: Provide education on orientation to the wound center Notes: ` Wound/Skin Impairment Nursing Diagnoses: Impaired tissue integrity Goals: Ulcer/skin breakdown will have a volume reduction of 80% by week 12 Date Initiated: 01/05/2018 Target Resolution Date: 05/07/2018 Goal Status: Active Interventions: TONIANNE, FINE ([id number]) Assess ulceration(s) every visit Notes: Electronic Signature(s) Signed: 01/26/2018 5:11:52 PM By: Gretta Cool, BSN, RN, CWS, Kim RN, BSN Entered By: Gretta Cool, BSN, RN, CWS, Kim on 01/26/2018 [place], [place]. ([id number]) -------------------------------------------------------------------------------- Pain Assessment Details Patient Name: Ariana [name]. Date of Service: 01/26/2018 9:30 AM Medical Record Number: [id number] Patient Account Number: [id number] Date of Birth/Sex: [birth year] (84 y.o. F) Treating RN: Ahmed Prima Primary Care Liseth Wann: Fulton Reek Other Clinician: Referring Tyashia Morrisette: Fulton Reek Treating Kylia Grajales/Extender: Tito Dine in Treatment: 3 Active Problems Location of Pain Severity and Description of Pain Patient Has Paino No Site Locations Pain Management and Medication Current Pain Management: Electronic Signature(s) Signed: 01/27/2018 5:13:37 PM By: Alric Quan Entered By: Alric Quan on 01/26/2018 09:30:39 Ariana [name] ([id number]) -------------------------------------------------------------------------------- Patient/Caregiver Education Details Patient Name: Ariana [name] Date of Service: 01/26/2018 9:30 AM Medical Record Number: [id number] Patient  Account Number: [id number] Date of Birth/Gender: 24-Oct-1932 (84 y.o. F) Treating RN: Cornell Barman Primary Care Physician: Fulton Reek Other Clinician: Referring Physician: Fulton Reek Treating Physician/Extender: Tito Dine in Treatment: 3 Education Assessment Education Provided To: Patient Education Topics Provided Welcome To [facility]: Wound/Skin Impairment: Handouts: Caring for Your Ulcer, Other: wound care as prescribed Methods: Demonstration, Explain/Verbal Responses: State content correctly Electronic Signature(s) Signed: 01/26/2018 5:11:52 PM By: Gretta Cool, BSN, RN, CWS, Kim RN, BSN Entered By: Gretta Cool, BSN, RN, CWS, Kim on 01/26/2018 09:44:10 Ariana [name] ([id number]) -------------------------------------------------------------------------------- Wound Assessment Details Patient Name: Ariana [name] Date of Service: 01/26/2018 9:30 AM Medical Record Number: [id number] Patient Account Number: [id number] Date of Birth/Sex: 1933/10/02 (84 y.o. F) Treating RN: Ahmed Prima Primary Care Dai Mcadams: Fulton Reek Other Clinician: Referring Mattheu Brodersen: Fulton Reek Treating Kimberly Nieland/Extender: Tito Dine in Treatment: 3 Wound Status Wound Number: 4 Primary Etiology: Trauma, Other Wound Location: Left Lower Leg - Anterior Wound Status: Open Wounding Event: Trauma Comorbid History: Cataracts, Osteoarthritis Date Acquired: 12/31/2017 Weeks Of Treatment: 3 Clustered Wound: No Photos Photo Uploaded By: Roger Shelter on 01/26/2018 16:31:29 Wound Measurements Length: (cm) 1.5 Width: (cm) 1.6 Depth: (cm) 0.1 Area: (cm) 1.885 Volume: (cm) 0.188 % Reduction in Area: 65.7% % Reduction in Volume: 65.8% Epithelialization: None Tunneling: No Undermining: No Wound Description Full Thickness Without Exposed Support Classification: Structures Wound Margin: Distinct, outline attached Exudate Large Amount: Exudate Type: Serosanguineous Exudate Color: red, brown Foul Odor After Cleansing: No Slough/Fibrino Yes Wound Bed Granulation Amount: Medium (34-66%) Exposed Structure Granulation Quality:  Red, Pink, Hyper-granulation Fascia Exposed: No Necrotic Amount: Medium (34-66%) Fat Layer (Subcutaneous Tissue) Exposed: Yes Necrotic Quality: Adherent Slough Tendon Exposed: No Muscle Exposed: No Joint Exposed: No Bone Exposed: No Callies, Manasvini E. ([id number]) Periwound Skin Texture Texture Color No Abnormalities Noted: No No Abnormalities Noted: No Callus: No Atrophie Blanche: No Crepitus: No Cyanosis: No Excoriation: Yes Ecchymosis: No Induration: No Erythema: Yes Rash: No Erythema Location: Circumferential Scarring: No Hemosiderin Staining: No Mottled: No Moisture Pallor: No No Abnormalities Noted: No Rubor: No Dry / Scaly: No Maceration: Yes Temperature / Pain Temperature: No Abnormality Tenderness on Palpation: Yes Wound Preparation Ulcer Cleansing: Rinsed/Irrigated with Saline Topical Anesthetic Applied: Other: lidocaine 4%, Treatment Notes Wound #4 (Left, Anterior Lower Leg) 1. Cleansed with: Clean wound with Normal Saline 2. Anesthetic Topical Lidocaine 4% cream to wound bed prior to debridement 4. Dressing Applied: Other dressing (specify in notes) 5. Secondary Dressing Applied Bordered Foam Dressing Notes S. Cell Electronic Signature(s) Signed: 01/27/2018 5:13:37 PM By: Alric Quan Entered By: Alric Quan on 01/26/2018 09:34:22 Ariana [name] ([id number]) -------------------------------------------------------------------------------- Vitals Details Patient Name: Ariana [name] Date of Service: 01/26/2018 9:30 AM Medical Record Number: [id number] Patient Account Number: [id number] Date of Birth/Sex: Apr 11, 1933 (84 y.o. F) Treating RN: Ahmed Prima Primary Care Jiyaan Steinhauser: Fulton Reek Other Clinician: Referring Flara Storti: Fulton Reek Treating Azelia Reiger/Extender: Tito Dine in Treatment: 3 Vital Signs Time Taken: 09:30 Temperature (F): 98.3 Height (in): 66 Pulse (bpm): 59 Weight (lbs):  141.1 Respiratory Rate (breaths/min): 18 Body Mass Index (BMI): 22.8 Blood Pressure (mmHg): 138/55 Reference Range: 80 - 120 mg / dl Electronic Signature(s) Signed: 01/27/2018 5:13:37 PM By: Alric Quan Entered By: Alric Quan on 01/26/2018 09:31:18

## 2018-02-06 NOTE — Progress Notes (Signed)
DARSHANA, CURNUTT ([id number]) Visit Report for 02/02/2018 HPI Details Patient Name: Ariana [name], Ariana [name]. Date of Service: 02/02/2018 10:30 AM Medical Record Number: [id number] Patient Account Number: [id number] Date of Birth/Sex: [birth year] (84 y.o. F) Treating RN: Cornell Barman Primary Care Provider: Fulton Reek Other Clinician: Referring Provider: Fulton Reek Treating Provider/Extender: Tito Dine in Treatment: 4 History of Present Illness HPI Description: 09/15/17; this is an 84 year old woman with a history of type 2 diabetes, chronic ITP for which she follows with oncology. She had a splenectomy in 2007. 3 weeks ago well sitting in the bathroom she fell off the toilet and hit her legs on the bathtub. She had 2 large hematomas. The area on the left has ruptured into a wound where the area on the right medial lower leg is actually still intact. She was given Keflex of concern for coexistent cellulitis on 11/28 the area on the right is still intact and she is not doing anything specific for that. She has a history of small hematomas related to thrombocytopenia on her lower legs although these all seem to have primarily healed. She has a history of falls 09/22/17; this is a patient who had traumatic hematomas on both her legs. When we admitted to clinic last week the left one had already evacuated whereas the right one had not. She arrived in clinic today with drainage out of the right side. We used Hydrofera Blue to the left 09/29/17; this is a patient who had a traumatic hematoma on both her legs. She has chronic ITP and states she bruises very easily. The right leg hematoma evacuated last week. We have been using Hydrofera Blue to both wound areas on both anterior legs. Today the one on the left is slightly hyper-granulated if the wound stalls we may need to address this. Otherwise the wounds are smaller and look healthy today 10/06/17; traumatic hematomas on both her  legs left greater than right. She has chronic venous insufficiency also chronic ITP. We have been using Hydrofera Blue to both of these wound areas. She has new wounds on the right lateral leg caused by her dog 10/20/17; traumatic hematomas on both her legs left greater than right which is left her with chronic bilateral wounds. She has chronic venous insufficiency also chronic ITP. We've been using Hydrofera Blue to both of these wound areas largely because of hyperventilation. The right lateral leg wound was caused last visit by her dog. The left leg wound was caused by trauma against her bathtub. 10/27/17; these were initially traumatic hematomas on both her legs left greater than right. She hyper-granulation last week and I used silver nitrate. We've been using Hydrofera Blue to the wounds. Much better today especially on the left READMISSION 01/05/18; this is a patient I cared for late in 2018 and into 2019. She had traumatic hematomas on both anterior legs in the setting of chronic ITP. She has had a splenectomy.I have her listed as a type II diabetic although that may be an error. She denies this. In any case she states the wounds from last time" on their own and indeed both the areas seem to appeal today. The current problem started from 12/31/17; she was cleaning on top of her refrigerator standing on some form of stool and she fell backwards. She hit her head and has a laceration type injury on the left lateral leg. She has chronic venous insufficiency but no major edema. She has stockings but does not use them. They've been  using topical antibiotics 01/11/18; this is a patient I saw last week who had fallen and had a traumatic laceration on the left lower leg. We used silver alginate. She arrives in clinic today complaining of a lot more pain in the leg. There is some cognitive issues and is bit difficult to be certain what she is describing however it seems more localized to the left anterior  ankle although even that required some assessment. Her husband showed me a picture on his cell phone showing some swelling in the left foot dorsally left ankle on Saturday. They state it is getting difficult for her to walk. I didn't hear anything about this last week. 01/19/18; x-rays we did last week of this patient's left ankle and left tib-fib were negative. There was no osteomyelitis. She is still anxious and complains of pain. We are using silver collagen In general she looks better this week there is still some minor edema 01/26/18; we have been using silver collagen. The area looked wet to our intake nurse. Wounds are minor. This is on the anterior left lower leg. She has severe bilateral chronic venous insufficiency with hemosiderin deposition. Not much in the way of edema however. She does not wear stockings reliably Gatlin, Lennox E. ([id number]) 02/02/18; the patient's wounds are all healed on the left. She does not have much in the way of edema however she has severe chronic venous insufficiency. She does not wear stockings reliably Electronic Signature(s) Signed: 02/02/2018 5:40:08 PM By: Linton Ham MD Entered By: Linton Ham on 02/02/2018 12:40:24 Ariana [name] ([id number]) -------------------------------------------------------------------------------- Physical Exam Details Patient Name: Ariana [name] Date of Service: 02/02/2018 10:30 AM Medical Record Number: [id number] Patient Account Number: [id number] Date of Birth/Sex: 05-24-1933 (84 y.o. F) Treating RN: Cornell Barman Primary Care Provider: Fulton Reek Other Clinician: Referring Provider: Fulton Reek Treating Provider/Extender: Tito Dine in Treatment: 4 Constitutional Sitting or standing Blood Pressure is within target range for patient.. Pulse regular and within target range for patient.Marland Kitchen Respirations regular, non-labored and within target range.. Temperature is normal and within  the target range for the patient.Marland Kitchen appears in no distress. Notes wound exam; the patient has no open areas on the left calf. She has such severe chronic venous insufficiency with marked hemosiderin deposition. Very fragile skin but no open areas Electronic Signature(s) Signed: 02/02/2018 5:40:08 PM By: Linton Ham MD Entered By: Linton Ham on 02/02/2018 12:41:30 Ariana [name] ([id number]) -------------------------------------------------------------------------------- Physician Orders Details Patient Name: Ariana [name] Date of Service: 02/02/2018 10:30 AM Medical Record Number: [id number] Patient Account Number: [id number] Date of Birth/Sex: 1933-03-14 (84 y.o. F) Treating RN: Cornell Barman Primary Care Provider: Fulton Reek Other Clinician: Referring Provider: Fulton Reek Treating Provider/Extender: Tito Dine in Treatment: 4 Verbal / Phone Orders: No Diagnosis Coding Discharge From [facility] o Discharge from [place] complete. Wear compression stockings. Electronic Signature(s) Signed: 02/02/2018 12:27:24 PM By: Gretta Cool, BSN, RN, CWS, Kim RN, BSN Signed: 02/02/2018 5:40:08 PM By: Linton Ham MD Entered By: Gretta Cool, BSN, RN, CWS, Kim on 02/02/2018 11:40:36 Ariana [name] ([id number]) -------------------------------------------------------------------------------- Problem List Details Patient Name: Ariana [name], Ariana [name]. Date of Service: 02/02/2018 10:30 AM Medical Record Number: [id number] Patient Account Number: [id number] Date of Birth/Sex: 04/27/1933 (84 y.o. F) Treating RN: Cornell Barman Primary Care Provider: Fulton Reek Other Clinician: Referring Provider: Fulton Reek Treating Provider/Extender: Tito Dine in Treatment: 4 Active Problems ICD-10 Impacting Encounter Code Description Active Date Wound Healing Diagnosis S81.812D Laceration  without foreign body, left lower leg, subsequent  01/05/2018 Yes encounter L97.221 Non-pressure chronic ulcer of left calf limited to breakdown of 01/05/2018 Yes skin I87.301 Chronic venous hypertension (idiopathic) without 8/52/7782 Yes complications of right lower extremity D69.3 Immune thrombocytopenic purpura 01/05/2018 Yes S93.402D Sprain of unspecified ligament of left ankle, subsequent 01/12/2018 Yes encounter Inactive Problems Resolved Problems Electronic Signature(s) Signed: 02/02/2018 5:40:08 PM By: Linton Ham MD Entered By: Linton Ham on 02/02/2018 12:39:24 Ariana [name] ([id number]) -------------------------------------------------------------------------------- Progress Note Details Patient Name: Ariana [name]. Date of Service: 02/02/2018 10:30 AM Medical Record Number: [id number] Patient Account Number: [id number] Date of Birth/Sex: [birth year] (84 y.o. F) Treating RN: Cornell Barman Primary Care Provider: Fulton Reek Other Clinician: Referring Provider: Fulton Reek Treating Provider/Extender: Tito Dine in Treatment: 4 Subjective History of Present Illness (HPI) 09/15/17; this is an 84 year old woman with a history of type 2 diabetes, chronic ITP for which she follows with oncology. She had a splenectomy in 2007. 3 weeks ago well sitting in the bathroom she fell off the toilet and hit her legs on the bathtub. She had 2 large hematomas. The area on the left has ruptured into a wound where the area on the right medial lower leg is actually still intact. She was given Keflex of concern for coexistent cellulitis on 11/28 the area on the right is still intact and she is not doing anything specific for that. She has a history of small hematomas related to thrombocytopenia on her lower legs although these all seem to have primarily healed. She has a history of falls 09/22/17; this is a patient who had traumatic hematomas on both her legs. When we admitted to clinic last week the left one had  already evacuated whereas the right one had not. She arrived in clinic today with drainage out of the right side. We used Hydrofera Blue to the left 09/29/17; this is a patient who had a traumatic hematoma on both her legs. She has chronic ITP and states she bruises very easily. The right leg hematoma evacuated last week. We have been using Hydrofera Blue to both wound areas on both anterior legs. Today the one on the left is slightly hyper-granulated if the wound stalls we may need to address this. Otherwise the wounds are smaller and look healthy today 10/06/17; traumatic hematomas on both her legs left greater than right. She has chronic venous insufficiency also chronic ITP. We have been using Hydrofera Blue to both of these wound areas. She has new wounds on the right lateral leg caused by her dog 10/20/17; traumatic hematomas on both her legs left greater than right which is left her with chronic bilateral wounds. She has chronic venous insufficiency also chronic ITP. We've been using Hydrofera Blue to both of these wound areas largely because of hyperventilation. The right lateral leg wound was caused last visit by her dog. The left leg wound was caused by trauma against her bathtub. 10/27/17; these were initially traumatic hematomas on both her legs left greater than right. She hyper-granulation last week and I used silver nitrate. We've been using Hydrofera Blue to the wounds. Much better today especially on the left READMISSION 01/05/18; this is a patient I cared for late in 2018 and into 2019. She had traumatic hematomas on both anterior legs in the setting of chronic ITP. She has had a splenectomy.I have her listed as a type II diabetic although that may be an error. She denies this. In any case she states  the wounds from last time" on their own and indeed both the areas seem to appeal today. The current problem started from 12/31/17; she was cleaning on top of her refrigerator standing on  some form of stool and she fell backwards. She hit her head and has a laceration type injury on the left lateral leg. She has chronic venous insufficiency but no major edema. She has stockings but does not use them. They've been using topical antibiotics 01/11/18; this is a patient I saw last week who had fallen and had a traumatic laceration on the left lower leg. We used silver alginate. She arrives in clinic today complaining of a lot more pain in the leg. There is some cognitive issues and is bit difficult to be certain what she is describing however it seems more localized to the left anterior ankle although even that required some assessment. Her husband showed me a picture on his cell phone showing some swelling in the left foot dorsally left ankle on Saturday. They state it is getting difficult for her to walk. I didn't hear anything about this last week. 01/19/18; x-rays we did last week of this patient's left ankle and left tib-fib were negative. There was no osteomyelitis. She is still anxious and complains of pain. We are using silver collagen In general she looks better this week there is still some minor edema 01/26/18; we have been using silver collagen. The area looked wet to our intake nurse. Wounds are minor. This is on the anterior left lower leg. She has severe bilateral chronic venous insufficiency with hemosiderin deposition. Not much in the way of edema however. She does not wear stockings reliably 02/02/18; the patient's wounds are all healed on the left. She does not have much in the way of edema however she has severe chronic venous insufficiency. She does not wear stockings reliably Ariana [name], [name] ([id number]) Objective Constitutional Sitting or standing Blood Pressure is within target range for patient.. Pulse regular and within target range for patient.Marland Kitchen Respirations regular, non-labored and within target range.. Temperature is normal and within the target range for  the patient.Marland Kitchen appears in no distress. Vitals Time Taken: 10:56 AM, Height: 66 in, Weight: 141.1 lbs, BMI: 22.8, Temperature: 98.0 F, Pulse: 60 bpm, Respiratory Rate: 18 breaths/min, Blood Pressure: 143/53 mmHg. General Notes: wound exam; the patient has no open areas on the left calf. She has such severe chronic venous insufficiency with marked hemosiderin deposition. Very fragile skin but no open areas Integumentary (Hair, Skin) Wound #4 status is Healed - Epithelialized. Original cause of wound was Trauma. The wound is located on the Left,Anterior Lower Leg. The wound measures 0cm length x 0cm width x 0cm depth; 0cm^2 area and 0cm^3 volume. There is Fat Layer (Subcutaneous Tissue) Exposed exposed. There is no tunneling or undermining noted. There is a none present amount of drainage noted. The wound margin is distinct with the outline attached to the wound base. There is no granulation within the wound bed. There is no necrotic tissue within the wound bed. The periwound skin appearance exhibited: Excoriation, Erythema. The periwound skin appearance did not exhibit: Callus, Crepitus, Induration, Rash, Scarring, Dry/Scaly, Maceration, Atrophie Blanche, Cyanosis, Ecchymosis, Hemosiderin Staining, Mottled, Pallor, Rubor. The surrounding wound skin color is noted with erythema which is circumferential. Periwound temperature was noted as No Abnormality. The periwound has tenderness on palpation. Assessment Active Problems ICD-10 S81.812D - Laceration without foreign body, left lower leg, subsequent encounter L97.221 - Non-pressure chronic ulcer of left calf  limited to breakdown of skin I87.301 - Chronic venous hypertension (idiopathic) without complications of right lower extremity D69.3 - Immune thrombocytopenic purpura S93.402D - Sprain of unspecified ligament of left ankle, subsequent encounter Plan Discharge From [facility] Services: Discharge from [place] complete. Wear  compression stockings. ELLYCE, LAFEVERS ([id number]) #1 patient to be discharged from the wound care center #2 they have apparently ordered compression stockings 20-30 mm below she didn't have these on either side today. #3 reminded about skin lubrication daily at night Electronic Signature(s) Signed: 02/02/2018 5:40:08 PM By: Linton Ham MD Entered By: Linton Ham on 02/02/2018 12:42:15 Ariana [name] ([id number]) -------------------------------------------------------------------------------- SuperBill Details Patient Name: Ariana [name] Date of Service: 02/02/2018 Medical Record Number: [id number] Patient Account Number: [id number] Date of Birth/Sex: [birth year] (84 y.o. F) Treating RN: Cornell Barman Primary Care Provider: Fulton Reek Other Clinician: Referring Provider: Fulton Reek Treating Provider/Extender: Tito Dine in Treatment: 4 Diagnosis Coding ICD-10 Codes Code Description [phone] Laceration without foreign body, left lower leg, subsequent encounter L97.221 Non-pressure chronic ulcer of left calf limited to breakdown of skin I87.301 Chronic venous hypertension (idiopathic) without complications of right lower extremity D69.3 Immune thrombocytopenic purpura S93.402D Sprain of unspecified ligament of left ankle, subsequent encounter Facility Procedures CPT4 Code: [id number] Description: [phone] - WOUND CARE VISIT-LEV 1 EST PT Modifier: Quantity: 1 Physician Procedures CPT4 Code Description: [id number] - WC PHYS LEVEL 2 - EST PT ICD-10 Diagnosis Description L97.221 Non-pressure chronic ulcer of left calf limited to breakdown I87.301 Chronic venous hypertension (idiopathic) without complication Modifier: of skin s of right lower Quantity: 1 extremity Electronic Signature(s) Signed: 02/02/2018 5:40:08 PM By: Linton Ham MD Entered By: Linton Ham on 02/02/2018 12:42:36

## 2018-02-08 NOTE — Progress Notes (Signed)
Ariana [name] ([id number]) Visit Report for 02/02/2018 Arrival Information Details Patient Name: Ariana [name]. Date of Service: 02/02/2018 10:30 AM Medical Record Number: [id number] Patient Account Number: [id number] Date of Birth/Sex: Nov 27, 1932 (84 y.o. F) Treating RN: Ahmed Prima Primary Care Roston Grunewald: Fulton Reek Other Clinician: Referring Kwamaine Cuppett: Fulton Reek Treating Jahlani Lorentz/Extender: Tito Dine in Treatment: 4 Visit Information History Since Last Visit All ordered tests and consults were completed: No Patient Arrived: Ambulatory Added or deleted any medications: No Arrival Time: 10:53 Any new allergies or adverse reactions: No Accompanied By: spouse Had a fall or experienced change in No Transfer Assistance: None activities of daily living that may affect Patient Identification Verified: Yes risk of falls: Secondary Verification Process Yes Signs or symptoms of abuse/neglect since last visito No Completed: Hospitalized since last visit: No Patient Requires Transmission-Based No Implantable device outside of the clinic excluding No Precautions: cellular tissue based products placed in the center Patient Has Alerts: Yes since last visit: Patient Alerts: ABI done 09/15/17 Pain Present Now: No 0.79 Electronic Signature(s) Signed: 02/04/2018 4:29:16 PM By: Alric Quan Entered By: Alric Quan on 02/02/2018 10:55:40 Ariana [name] ([id number]) -------------------------------------------------------------------------------- Clinic Level of Care Assessment Details Patient Name: Ariana [name] Date of Service: 02/02/2018 10:30 AM Medical Record Number: [id number] Patient Account Number: [id number] Date of Birth/Sex: December 20, 1932 (84 y.o. F) Treating RN: Cornell Barman Primary Care Lehi Phifer: Fulton Reek Other Clinician: Referring Bethania Schlotzhauer: Fulton Reek Treating Kaydense Rizo/Extender: Tito Dine in Treatment:  4 Clinic Level of Care Assessment Items TOOL 4 Quantity Score []  - Use when only an EandM is performed on FOLLOW-UP visit 0 ASSESSMENTS - Nursing Assessment / Reassessment []  - Reassessment of Co-morbidities (includes updates in patient status) 0 []  - 0 Reassessment of Adherence to Treatment Plan ASSESSMENTS - Wound and Skin Assessment / Reassessment X - Simple Wound Assessment / Reassessment - one wound 1 5 []  - 0 Complex Wound Assessment / Reassessment - multiple wounds []  - 0 Dermatologic / Skin Assessment (not related to wound area) ASSESSMENTS - Focused Assessment []  - Circumferential Edema Measurements - multi extremities 0 []  - 0 Nutritional Assessment / Counseling / Intervention []  - 0 Lower Extremity Assessment (monofilament, tuning fork, pulses) []  - 0 Peripheral Arterial Disease Assessment (using hand held doppler) ASSESSMENTS - Ostomy and/or Continence Assessment and Care []  - Incontinence Assessment and Management 0 []  - 0 Ostomy Care Assessment and Management (repouching, etc.) PROCESS - Coordination of Care X - Simple Patient / Family Education for ongoing care 1 15 []  - 0 Complex (extensive) Patient / Family Education for ongoing care []  - 0 Staff obtains Programmer, systems, Records, Test Results / Process Orders []  - 0 Staff telephones HHA, Nursing Homes / Clarify orders / etc []  - 0 Routine Transfer to another Facility (non-emergent condition) []  - 0 Routine Hospital Admission (non-emergent condition) []  - 0 New Admissions / Biomedical engineer / Ordering NPWT, Apligraf, etc. []  - 0 Emergency Hospital Admission (emergent condition) X- 1 10 Simple Discharge Coordination [name], [name] ([id number]) []  - 0 Complex (extensive) Discharge Coordination PROCESS - Special Needs []  - Pediatric / Minor Patient Management 0 []  - 0 Isolation Patient Management []  - 0 Hearing / Language / Visual special needs []  - 0 Assessment of Community assistance  (transportation, D/C planning, etc.) []  - 0 Additional assistance / Altered mentation []  - 0 Support Surface(s) Assessment (bed, cushion, seat, etc.) INTERVENTIONS - Wound Cleansing / Measurement []  - Simple Wound Cleansing - one wound 0 []  -  0 Complex Wound Cleansing - multiple wounds []  - 0 Wound Imaging (photographs - any number of wounds) []  - 0 Wound Tracing (instead of photographs) []  - 0 Simple Wound Measurement - one wound []  - 0 Complex Wound Measurement - multiple wounds INTERVENTIONS - Wound Dressings []  - Small Wound Dressing one or multiple wounds 0 []  - 0 Medium Wound Dressing one or multiple wounds []  - 0 Large Wound Dressing one or multiple wounds []  - 0 Application of Medications - topical []  - 0 Application of Medications - injection INTERVENTIONS - Miscellaneous []  - External ear exam 0 []  - 0 Specimen Collection (cultures, biopsies, blood, body fluids, etc.) []  - 0 Specimen(s) / Culture(s) sent or taken to Lab for analysis []  - 0 Patient Transfer (multiple staff / Civil Service fast streamer / Similar devices) []  - 0 Simple Staple / Suture removal (25 or less) []  - 0 Complex Staple / Suture removal (26 or more) []  - 0 Hypo / Hyperglycemic Management (close monitor of Blood Glucose) []  - 0 Ankle / Brachial Index (ABI) - do not check if billed separately X- 1 5 Vital Signs Macwilliams, Halei E. ([id number]) Has the patient been seen at the hospital within the last three years: Yes Total Score: 35 Level Of Care: New/Established - Level 1 Electronic Signature(s) Signed: 02/02/2018 12:27:24 PM By: Gretta Cool, BSN, RN, CWS, Kim RN, BSN Entered By: Gretta Cool, BSN, RN, CWS, Kim on 02/02/2018 11:42:13 Ariana [name] ([id number]) -------------------------------------------------------------------------------- Encounter Discharge Information Details Patient Name: Ariana [name]. Date of Service: 02/02/2018 10:30 AM Medical Record Number: [id number] Patient Account  Number: [id number] Date of Birth/Sex: 1933/02/27 (84 y.o. F) Treating RN: Cornell Barman Primary Care Elmer Merwin: Fulton Reek Other Clinician: Referring Haruto Demaria: Fulton Reek Treating Gene Glazebrook/Extender: Tito Dine in Treatment: 4 Encounter Discharge Information Items Discharge Pain Level: 0 Discharge Condition: Stable Ambulatory Status: Ambulatory Discharge Destination: Home Transportation: Private Auto Accompanied By: spouse Schedule Follow-up Appointment: No Medication Reconciliation completed and No provided to Patient/Care Conchetta Lamia: Provided on Clinical Summary of Care: 02/02/2018 Form Type Recipient Paper Patient BI Electronic Signature(s) Signed: 02/02/2018 12:53:03 PM By: Montey Hora Entered By: Montey Hora on 02/02/2018 12:53:03 Ariana [name] ([id number]) -------------------------------------------------------------------------------- Lower Extremity Assessment Details Patient Name: Ariana [name]. Date of Service: 02/02/2018 10:30 AM Medical Record Number: [id number] Patient Account Number: [id number] Date of Birth/Sex: [birth year] (84 y.o. F) Treating RN: Ahmed Prima Primary Care Wadell Craddock: Fulton Reek Other Clinician: Referring Tiye Huwe: Fulton Reek Treating Lang Zingg/Extender: Tito Dine in Treatment: 4 Vascular Assessment Pulses: Dorsalis Pedis Palpable: [Left:Yes] Posterior Tibial Extremity colors, hair growth, and conditions: Extremity Color: [Left:Hyperpigmented] Temperature of Extremity: [Left:Warm] Capillary Refill: [Left:< 3 seconds] Toe Nail Assessment Left: Right: Thick: No Discolored: No Deformed: No Improper Length and Hygiene: No Electronic Signature(s) Signed: 02/04/2018 4:29:16 PM By: Alric Quan Entered By: Alric Quan on 02/02/2018 10:59:47 Ariana [name] ([id number]) -------------------------------------------------------------------------------- Multi Wound Chart  Details Patient Name: Ariana [name]. Date of Service: 02/02/2018 10:30 AM Medical Record Number: [id number] Patient Account Number: [id number] Date of Birth/Sex: 1933/05/20 (84 y.o. F) Treating RN: Cornell Barman Primary Care Kalon Erhardt: Fulton Reek Other Clinician: Referring Linton Stolp: Fulton Reek Treating Xiomar Crompton/Extender: Tito Dine in Treatment: 4 Vital Signs Height(in): 66 Pulse(bpm): 60 Weight(lbs): 141.1 Blood Pressure(mmHg): 143/53 Body Mass Index(BMI): 23 Temperature(F): 98.0 Respiratory Rate 18 (breaths/min): Photos: [4:No Photos] [N/A:N/A] Wound Location: [4:Left Lower Leg - Anterior] [N/A:N/A] Wounding Event: [4:Trauma] [N/A:N/A] Primary Etiology: [4:Trauma, Other] [N/A:N/A] Comorbid History: [4:Cataracts, Osteoarthritis] [N/A:N/A] Date Acquired: [4:12/31/2017] [N/A:N/A]  Weeks of Treatment: [4:4] [N/A:N/A] Wound Status: [4:Healed - Epithelialized] [N/A:N/A] Measurements L x W x D [4:0x0x0] [N/A:N/A] (cm) Area (cm) : [4:0] [N/A:N/A] Volume (cm) : [4:0] [N/A:N/A] % Reduction in Area: [4:100.00%] [N/A:N/A] % Reduction in Volume: [4:100.00%] [N/A:N/A] Classification: [4:Full Thickness Without Exposed Support Structures] [N/A:N/A] Exudate Amount: [4:None Present] [N/A:N/A] Wound Margin: [4:Distinct, outline attached] [N/A:N/A] Granulation Amount: [4:None Present (0%)] [N/A:N/A] Necrotic Amount: [4:None Present (0%)] [N/A:N/A] Exposed Structures: [4:Fat Layer (Subcutaneous Tissue) Exposed: Yes Fascia: No Tendon: No Muscle: No Joint: No Bone: No] [N/A:N/A] Epithelialization: [4:None] [N/A:N/A] Periwound Skin Texture: [4:Excoriation: Yes Induration: No Callus: No Crepitus: No Rash: No Scarring: No] [N/A:N/A] Periwound Skin Moisture: [4:Maceration: No Dry/Scaly: No] [N/A:N/A] Periwound Skin Color: [N/A:N/A] Erythema: Yes Atrophie Blanche: No Cyanosis: No Ecchymosis: No Hemosiderin Staining: No Mottled: No Pallor: No Rubor: No Erythema  Location: Circumferential N/A N/A Temperature: No Abnormality N/A N/A Tenderness on Palpation: Yes N/A N/A Wound Preparation: Ulcer Cleansing: N/A N/A Rinsed/Irrigated with Saline Topical Anesthetic Applied: None Treatment Notes Electronic Signature(s) Signed: 02/02/2018 5:40:08 PM By: Linton Ham MD Previous Signature: 02/02/2018 12:27:24 PM Version By: Gretta Cool, BSN, RN, CWS, Kim RN, BSN Entered By: Linton Ham on 02/02/2018 12:39:44 Ariana [name] ([id number]) -------------------------------------------------------------------------------- Multi-Disciplinary Care Plan Details Patient Name: Ariana [name]. Date of Service: 02/02/2018 10:30 AM Medical Record Number: [id number] Patient Account Number: [id number] Date of Birth/Sex: [birth year] (84 y.o. F) Treating RN: Cornell Barman Primary Care Jniya Madara: Fulton Reek Other Clinician: Referring Atisha Hamidi: Fulton Reek Treating Bryanah Sidell/Extender: Tito Dine in Treatment: 4 Active Inactive Electronic Signature(s) Signed: 02/02/2018 12:27:24 PM By: Gretta Cool, BSN, RN, CWS, Kim RN, BSN Entered By: Gretta Cool, BSN, RN, CWS, Kim on 02/02/2018 11:39:51 Ariana [name] ([id number]) -------------------------------------------------------------------------------- Pain Assessment Details Patient Name: Ariana [name] Date of Service: 02/02/2018 10:30 AM Medical Record Number: [id number] Patient Account Number: [id number] Date of Birth/Sex: 1932/12/07 (84 y.o. F) Treating RN: Ahmed Prima Primary Care Minard Millirons: Fulton Reek Other Clinician: Referring Dashaun Onstott: Fulton Reek Treating Delvin [name]/Extender: Tito Dine in Treatment: 4 Active Problems Location of Pain Severity and Description of Pain Patient Has Paino No Site Locations Pain Management and Medication Current Pain Management: Electronic Signature(s) Signed: 02/04/2018 4:29:16 PM By: Alric Quan Entered By: Alric Quan on  02/02/2018 10:55:46 Ariana [name] ([id number]) -------------------------------------------------------------------------------- Patient/Caregiver Education Details Patient Name: Ariana [name] Date of Service: 02/02/2018 10:30 AM Medical Record Number: [id number] Patient Account Number: [id number] Date of Birth/Gender: July 19, 1933 (84 y.o. F) Treating RN: Montey Hora Primary Care Physician: Fulton Reek Other Clinician: Referring Physician: Fulton Reek Treating Physician/Extender: Tito Dine in Treatment: 4 Education Assessment Education Provided To: Patient and Caregiver Education Topics Provided Basic Hygiene: Handouts: Other: care of newly healed ulcer site Methods: Explain/Verbal Responses: State content correctly Electronic Signature(s) Signed: 02/02/2018 5:00:43 PM By: Montey Hora Entered By: Montey Hora on 02/02/2018 12:53:26 Ariana [name] ([id number]) -------------------------------------------------------------------------------- Wound Assessment Details Patient Name: Ariana [name]. Date of Service: 02/02/2018 10:30 AM Medical Record Number: [id number] Patient Account Number: [id number] Date of Birth/Sex: 11/04/1932 (84 y.o. F) Treating RN: Ahmed Prima Primary Care Shemeika Starzyk: Fulton Reek Other Clinician: Referring Mickenzie Stolar: Fulton Reek Treating Artemus Romanoff/Extender: Tito Dine in Treatment: 4 Wound Status Wound Number: 4 Primary Etiology: Trauma, Other Wound Location: Left Lower Leg - Anterior Wound Status: Healed - Epithelialized Wounding Event: Trauma Comorbid History: Cataracts, Osteoarthritis Date Acquired: 12/31/2017 Weeks Of Treatment: 4 Clustered Wound: No Photos Photo Uploaded By: Roger Shelter on 02/02/2018 16:54:15 Wound Measurements Length: (cm) 0 Width: (cm)  0 Depth: (cm) 0 Area: (cm) 0 Volume: (cm) 0 % Reduction in Area: 100% % Reduction in Volume:  100% Epithelialization: None Tunneling: No Undermining: No Wound Description Full Thickness Without Exposed Support Classification: Structures Wound Margin: Distinct, outline attached Exudate None Present Amount: Foul Odor After Cleansing: No Slough/Fibrino No Wound Bed Granulation Amount: None Present (0%) Exposed Structure Necrotic Amount: None Present (0%) Fascia Exposed: No Fat Layer (Subcutaneous Tissue) Exposed: Yes Tendon Exposed: No Muscle Exposed: No Joint Exposed: No Bone Exposed: No Periwound Skin Texture Texture Color Campione, Charnell E. ([id number]) No Abnormalities Noted: No No Abnormalities Noted: No Callus: No Atrophie Blanche: No Crepitus: No Cyanosis: No Excoriation: Yes Ecchymosis: No Induration: No Erythema: Yes Rash: No Erythema Location: Circumferential Scarring: No Hemosiderin Staining: No Mottled: No Moisture Pallor: No No Abnormalities Noted: No Rubor: No Dry / Scaly: No Maceration: No Temperature / Pain Temperature: No Abnormality Tenderness on Palpation: Yes Wound Preparation Ulcer Cleansing: Rinsed/Irrigated with Saline Topical Anesthetic Applied: None Electronic Signature(s) Signed: 02/04/2018 4:29:16 PM By: Alric Quan Entered By: Alric Quan on 02/02/2018 10:59:24 Ariana [name] ([id number]) -------------------------------------------------------------------------------- Vitals Details Patient Name: Ariana [name]. Date of Service: 02/02/2018 10:30 AM Medical Record Number: [id number] Patient Account Number: [id number] Date of Birth/Sex: 09/01/1933 (84 y.o. F) Treating RN: Ahmed Prima Primary Care Tanysha Quant: Fulton Reek Other Clinician: Referring Yosgart Pavey: Fulton Reek Treating Berlin Viereck/Extender: Tito Dine in Treatment: 4 Vital Signs Time Taken: 10:56 Temperature (F): 98.0 Height (in): 66 Pulse (bpm): 60 Weight (lbs): 141.1 Respiratory Rate (breaths/min): 18 Body Mass  Index (BMI): 22.8 Blood Pressure (mmHg): 143/53 Reference Range: 80 - 120 mg / dl Electronic Signature(s) Signed: 02/04/2018 4:29:16 PM By: Alric Quan Entered By: Alric Quan on 02/02/2018 10:57:30

## 2018-02-14 ENCOUNTER — Emergency Department: Payer: Medicare HMO

## 2018-02-14 ENCOUNTER — Emergency Department
Admission: EM | Admit: 2018-02-14 | Discharge: 2018-02-14 | Disposition: A | Discharge status: Home / Self Care | Payer: Medicare HMO | Attending: Emergency Medicine | Admitting: Emergency Medicine

## 2018-02-14 ENCOUNTER — Encounter: Payer: Self-pay | Admitting: Emergency Medicine

## 2018-02-14 DIAGNOSIS — Y999 Unspecified external cause status: Secondary | ICD-10-CM | POA: Insufficient documentation

## 2018-02-14 DIAGNOSIS — E039 Hypothyroidism, unspecified: Secondary | ICD-10-CM | POA: Diagnosis not present

## 2018-02-14 DIAGNOSIS — Y9389 Activity, other specified: Secondary | ICD-10-CM | POA: Insufficient documentation

## 2018-02-14 DIAGNOSIS — G301 Alzheimer's disease with late onset: Secondary | ICD-10-CM | POA: Diagnosis not present

## 2018-02-14 DIAGNOSIS — F0280 Dementia in other diseases classified elsewhere without behavioral disturbance: Secondary | ICD-10-CM | POA: Insufficient documentation

## 2018-02-14 DIAGNOSIS — S61210A Laceration without foreign body of right index finger without damage to nail, initial encounter: Secondary | ICD-10-CM | POA: Diagnosis not present

## 2018-02-14 DIAGNOSIS — Z79899 Other long term (current) drug therapy: Secondary | ICD-10-CM | POA: Diagnosis not present

## 2018-02-14 DIAGNOSIS — W228XXA Striking against or struck by other objects, initial encounter: Secondary | ICD-10-CM | POA: Insufficient documentation

## 2018-02-14 DIAGNOSIS — Y9289 Other specified places as the place of occurrence of the external cause: Secondary | ICD-10-CM | POA: Insufficient documentation

## 2018-02-14 DIAGNOSIS — S61411A Laceration without foreign body of right hand, initial encounter: Secondary | ICD-10-CM | POA: Diagnosis not present

## 2018-02-14 DIAGNOSIS — I10 Essential (primary) hypertension: Secondary | ICD-10-CM | POA: Insufficient documentation

## 2018-02-14 DIAGNOSIS — J45909 Unspecified asthma, uncomplicated: Secondary | ICD-10-CM | POA: Insufficient documentation

## 2018-02-14 MED ORDER — LIDOCAINE-EPINEPHRINE-TETRACAINE (LET) SOLUTION
NASAL | Status: AC
  Administered 2018-02-14: 9 mL via TOPICAL
  Filled 2018-02-14: qty 3

## 2018-02-14 MED ORDER — CEPHALEXIN 500 MG PO CAPS: 500.0000 mg | ORAL_CAPSULE | Freq: Three times a day (TID) | ORAL | 0 refills | Status: AC

## 2018-02-14 MED ORDER — LIDOCAINE HCL 1 % IJ SOLN
5.0000 mL | Freq: Once | INTRAMUSCULAR | Status: AC
  Administered 2018-02-14: 5 mL
  Filled 2018-02-14: qty 5

## 2018-02-14 MED ORDER — LIDOCAINE HCL (PF) 1 % IJ SOLN
INTRAMUSCULAR | Status: AC
  Administered 2018-02-14: 5 mL
  Filled 2018-02-14: qty 5

## 2018-02-14 MED ORDER — LIDOCAINE-EPINEPHRINE-TETRACAINE (LET) SOLUTION
9.0000 mL | Freq: Once | NASAL | Status: AC
  Administered 2018-02-14: 9 mL via TOPICAL
  Filled 2018-02-14: qty 9

## 2018-02-14 NOTE — ED Triage Notes (Signed)
Pt arrived with husband with complaints of a finger laceration. Pt states she accidentally hit her right hand against the car mirror which resulted in a laceration to her middle fingers on her right hand. Pt's hand currently wrapped in gauze.

## 2018-02-14 NOTE — Progress Notes (Signed)
[place]  [phone]  [phone]     Ariana [name] DOB: 08/12/1933  MR#: [id number]  EVO#:[id number]  Patient Care Team: Idelle Crouch, MD as PCP - General (Internal Medicine)  CHIEF COMPLAINT: Chronic refractory ITP  INTERVAL HISTORY:  Patient returns to clinic today for repeat laboratory work and consideration of additional Nplate.  She apparently has had a recent adjustment of her medications and appears more forgetful today than usual.  She also had an injury to her right hand requiring 16 stitches.  She does not complain of weakness or fatigue today.  She continues to have occasional easy bruising.  She denies any bleeding. She has no neurologic complaints. She denies any fevers. She has no chest pain or shortness of breath. She denies any nausea, vomiting, constipation, or diarrhea.  She has no melena or hematochezia.  She has no urinary complaints.  Patient offers no specific complaints today.  REVIEW OF SYSTEMS:   Review of Systems  Constitutional: Negative.  Negative for fever, malaise/fatigue and weight loss.  HENT: Negative for congestion.   Respiratory: Negative.  Negative for cough, hemoptysis and shortness of breath.   Cardiovascular: Negative.  Negative for chest pain and leg swelling.  Gastrointestinal: Negative.  Negative for abdominal pain, blood in stool, diarrhea, melena and nausea.  Genitourinary: Negative.  Negative for dysuria and hematuria.  Musculoskeletal: Negative.   Skin: Negative.  Negative for rash.  Neurological: Negative.  Negative for sensory change, focal weakness and weakness.  Endo/Heme/Allergies: Bruises/bleeds easily.  Psychiatric/Behavioral: Negative.  Negative for depression and memory loss. The patient is not nervous/anxious.     As per HPI. Otherwise, a complete review of systems is negative.  PAST MEDICAL HISTORY: Past Medical History:  Diagnosis Date  . Anemia   . Anxiety   . Asthma    . Chronic back pain   . Depression   . Depression   . GERD (gastroesophageal reflux disease)   . Hiatal hernia   . Hypercholesteremia   . ITP (idiopathic thrombocytopenic purpura)   . Osteoarthritis   . Osteoporosis   . Pulmonary emboli ([place])   . Restless legs     PAST SURGICAL HISTORY: Past Surgical History:  Procedure Laterality Date  . CARPAL TUNNEL RELEASE    . ESOPHAGOGASTRODUODENOSCOPY (EGD) WITH PROPOFOL N/A 09/10/2015   Procedure: ESOPHAGOGASTRODUODENOSCOPY (EGD) WITH PROPOFOL;  Surgeon: Lollie Sails, MD;  Location: [facility] ENDOSCOPY;  Service: Endoscopy;  Laterality: N/A;  . LUMBAR [place]    . SPLENECTOMY, PARTIAL    . TOTAL ABDOMINAL HYSTERECTOMY      FAMILY HISTORY Family History  Problem Relation Age of Onset  . Stroke Mother     GYNECOLOGIC HISTORY:  No LMP recorded. Patient is postmenopausal.     ADVANCED DIRECTIVES:    HEALTH MAINTENANCE: Social History   Tobacco Use  . Smoking status: Never Smoker  . Smokeless tobacco: Never Used  Substance Use Topics  . Alcohol use: No    Alcohol/week: 0.0 oz  . Drug use: No     Allergies  Allergen Reactions  . Aspirin     Other reaction(s): Distress (finding)  . Diazepam     Other reaction(s): Itching of Skin sneezing  . Morphine     Other reaction(s): Itching of Skin  . Other     Other reaction(s): Unknown seasonal allergies  . Tetanus Toxoids     Other reaction(s): Localized superficial swelling of skin    Current Outpatient Medications  Medication Sig Dispense Refill  . acetaminophen (TYLENOL) 325 MG tablet Take 2 tablets (650 mg total) by mouth every 6 (six) hours as needed for mild pain (or Fever >/= 101). 100 tablet 0  . albuterol (PROVENTIL HFA;VENTOLIN HFA) 108 (90 BASE) MCG/ACT inhaler Inhale 2 puffs into the lungs every 6 (six) hours as needed for wheezing or shortness of breath.    Marland Kitchen buPROPion (WELLBUTRIN XL) 150 MG 24 hr tablet Take 150 mg by mouth daily.    . Calcium  Carbonate-Vitamin D (CALCIUM 600+D) 600-400 MG-UNIT per tablet Take 1 tablet by mouth daily.    . cephALEXin (KEFLEX) 500 MG capsule Take 1 capsule (500 mg total) by mouth 3 (three) times daily for 10 days. 30 capsule 0  . citalopram (CELEXA) 40 MG tablet Take 40 mg by mouth daily.    . Cyanocobalamin (RA VITAMIN B-12 TR) 1000 MCG TBCR Take 1,000 mcg by mouth daily.     Marland Kitchen donepezil (ARICEPT) 5 MG tablet Take 10 mg by mouth at bedtime.     . fluticasone (FLONASE) 50 MCG/ACT nasal spray Place into the nose.    Marland Kitchen Fluticasone-Salmeterol (ADVAIR) 250-50 MCG/DOSE AEPB Inhale 1 puff into the lungs daily as needed.     . gabapentin (NEURONTIN) 300 MG capsule Take 300 mg by mouth 4 (four) times daily.    Marland Kitchen HYDROcodone-acetaminophen (NORCO) 7.5-325 MG tablet 1 tablet by mouth q 6 h prn    . levothyroxine (SYNTHROID, LEVOTHROID) 50 MCG tablet Take 50 mcg by mouth daily before breakfast. Take 30 to 60 minutes before breakfast.    . montelukast (SINGULAIR) 10 MG tablet Take by mouth.    . Multiple Vitamins-Minerals (HAIR SKIN AND NAILS FORMULA PO) Take 1 tablet by mouth every evening.    . Multiple Vitamins-Minerals (OCUVITE-LUTEIN PO) Take 1 capsule by mouth daily.     Marland Kitchen oxybutynin (DITROPAN) 5 MG tablet Take 5 mg by mouth 3 (three) times daily.    . pantoprazole (PROTONIX) 20 MG tablet Take 20 mg by mouth 2 (two) times daily.     . phenazopyridine (PYRIDIUM) 200 MG tablet Take 1 tablet (200 mg total) by mouth 3 (three) times daily as needed for pain. 15 tablet 0  . polycarbophil (FIBERCON) 625 MG tablet Take by mouth.    . pravastatin (PRAVACHOL) 20 MG tablet Take 20 mg by mouth daily.    . predniSONE (DELTASONE) 10 MG tablet   1  . traMADol (ULTRAM) 50 MG tablet Take 50 mg by mouth 3 (three) times daily as needed for moderate pain or severe pain. pain    . vitamin C (ASCORBIC ACID) 500 MG tablet Take 500 mg by mouth daily.    . vitamin E (E-400) 400 UNIT capsule Take 400 Units by mouth daily.    Marland Kitchen  ALPRAZolam (XANAX) 0.5 MG tablet Take 0.5 mg by mouth 3 (three) times daily as needed for anxiety.     . memantine (NAMENDA) 5 MG tablet Take 5 mg by mouth 2 (two) times daily.     . prochlorperazine (COMPAZINE) 10 MG tablet Take 1 tablet (10 mg total) by mouth every 6 (six) hours as needed for nausea or vomiting. (Patient not taking: Reported on 02/17/2018) 30 tablet 2   No current facility-administered medications for this visit.     OBJECTIVE: BP (!) 162/75 (BP Location: Right Arm, Patient Position: Sitting)   Pulse 63   Temp (!) 96.1 F (35.6 C) (Tympanic)   Resp 18   Wt  143 lb (64.9 kg)   BMI 23.08 kg/m    Body mass index is 23.08 kg/m.    ECOG FS:1 - Symptomatic but completely ambulatory  General: Well-developed, well-nourished, no acute distress. Eyes: Pink conjunctiva, anicteric sclera. Lungs: Clear to auscultation bilaterally. Heart: Regular rate and rhythm. No rubs, murmurs, or gallops. Abdomen: Soft, nontender, nondistended. No organomegaly noted, normoactive bowel sounds. Musculoskeletal: No edema, cyanosis, or clubbing. Neuro: Alert, answering all questions appropriately. Cranial nerves grossly intact. Skin: No rashes or petechiae noted.  Bruising on bilateral forearms. Psych: Normal affect.  LAB RESULTS:  Appointment on 02/17/2018  Component Date Value Ref Range Status  . WBC 02/17/2018 12.0* 3.6 - 11.0 K/uL Final  . RBC 02/17/2018 3.94  3.80 - 5.20 MIL/uL Final  . Hemoglobin 02/17/2018 10.8* 12.0 - 16.0 g/dL Final  . HCT 02/17/2018 33.5* 35.0 - 47.0 % Final  . MCV 02/17/2018 85.0  80.0 - 100.0 fL Final  . MCH 02/17/2018 27.5  26.0 - 34.0 pg Final  . MCHC 02/17/2018 32.3  32.0 - 36.0 g/dL Final  . RDW 02/17/2018 18.6* 11.5 - 14.5 % Final  . Platelets 02/17/2018 40* 150 - 400 K/uL Final   Comment: PLATELET COUNT CONFIRMED BY SMEAR GIANT PLATELETS SEEN Performed at [facility], [address] [place], [place] [address]     STUDIES: No results  found.  ASSESSMENT:  Chronic refractory ITP  PLAN:   1. Chronic refractory ITP: Patient had a poor response to Prednisone, WinRho, and Rituxan. She is status post splenectomy in 2007. Her platelet count is less than 100 at 40 today, therefore we will proceed with 7 mcg/kg Nplate today. Return to clinic in 4 weeks for laboratory work, further evaluation, and consideration of Nplate if her platelet count continues to be less than 100. Patient appears to only requiring injections every 4 weeks, although this could possibly extended to every 6 weeks if her nadir continues to increase. Can consider Promacta 12.5mg  at a later date if necessary. Max dose of Nplate is 10 mcg/kg. 2. Abdominal pain: Resolved. CT scan did not reveal any distinct etiology. 3. Constipation: Patient does not complain of this today. 4. Weakness and fatigue: Chronic and unchanged.  Although she feels improved after receiving her injection. 5. Depression/dementia: Monitor. Continue follow-up with primary care. 6.  Right hand injury: Patient reports her sutures are being removed in 1 to 2 weeks.  Approximately 30 minutes was spent in discussion of which greater than 50% was consultation.  Patient expressed understanding and was in agreement with this plan. She also understands that She can call clinic at any time with any questions, concerns, or complaints.    Lloyd Huger, MD   02/20/2018 4:34 PM

## 2018-02-14 NOTE — ED Notes (Signed)
See triage note  States she was getting out of car quickly and hit her right hand on mirror  Laceration noted to right index and middle finger

## 2018-02-14 NOTE — ED Provider Notes (Signed)
[facility] Emergency Department Provider Note  ____________________________________________  Time seen: Approximately 4:05 PM  I have reviewed the triage vital signs and the nursing notes.   HISTORY  Chief Complaint Extremity Laceration    HPI Ariana [name] is a 84 y.o. female presenting to the emergency department with skin tears of the right hand.  Patient reports that she was excited about the prospect of driving when she struck her hand against the rearview mirror.  Patient has laceration sustained to the dorsal aspect of the right index finger right third digit.  Patient denies weakness, radiculopathy or changes in sensation of the upper extremities.  Her tetanus status is up-to-date.  Past Medical History:  Diagnosis Date  . Anemia   . Anxiety   . Asthma   . Chronic back pain   . Depression   . Depression   . GERD (gastroesophageal reflux disease)   . Hiatal hernia   . Hypercholesteremia   . ITP (idiopathic thrombocytopenic purpura)   . Osteoarthritis   . Osteoporosis   . Pulmonary emboli ([place])   . Restless legs     Patient Active Problem List   Diagnosis Date Noted  . Asthma without status asthmaticus 11/25/2016  . Late onset Alzheimer's disease without behavioral disturbance 12/30/2015  . Back muscle spasm 09/09/2015  . Clinical depression 04/12/2015  . Herpes zona 04/12/2015  . Chronic ITP (idiopathic thrombocytopenic purpura) (HCC) 03/15/2015  . Chest pain 10/22/2014  . Breathlessness on exertion 10/22/2014  . Osteopenia 10/18/2014  . Benign essential HTN 06/28/2014  . HLD (hyperlipidemia) 06/28/2014  . Acquired hypothyroidism 06/04/2014  . Anxiety and depression 06/04/2014  . Bursitis, trochanteric 03/27/2014  . DDD (degenerative disc disease), lumbar 02/27/2014  . Neuritis or radiculitis due to rupture of lumbar intervertebral disc 02/27/2014    Past Surgical History:  Procedure Laterality Date  . CARPAL TUNNEL  RELEASE    . ESOPHAGOGASTRODUODENOSCOPY (EGD) WITH PROPOFOL N/A 09/10/2015   Procedure: ESOPHAGOGASTRODUODENOSCOPY (EGD) WITH PROPOFOL;  Surgeon: Lollie Sails, MD;  Location: [facility] ENDOSCOPY;  Service: Endoscopy;  Laterality: N/A;  . LUMBAR [place]    . SPLENECTOMY, PARTIAL    . TOTAL ABDOMINAL HYSTERECTOMY      Prior to Admission medications   Medication Sig Start Date End Date Taking? Authorizing Provider  acetaminophen (TYLENOL) 325 MG tablet Take 2 tablets (650 mg total) by mouth every 6 (six) hours as needed for mild pain (or Fever >/= 101). 07/02/15   Idelle Crouch, MD  albuterol (PROVENTIL HFA;VENTOLIN HFA) 108 (90 BASE) MCG/ACT inhaler Inhale 2 puffs into the lungs every 6 (six) hours as needed for wheezing or shortness of breath.    [provider]  ALPRAZolam Duanne Moron) 0.5 MG tablet Take 0.5 mg by mouth 3 (three) times daily as needed for anxiety.  03/11/17   [provider]  buPROPion (WELLBUTRIN XL) 150 MG 24 hr tablet Take 150 mg by mouth daily.    [provider]  Calcium Carbonate-Vitamin D (CALCIUM 600+D) 600-400 MG-UNIT per tablet Take 1 tablet by mouth daily.    [provider]  cephALEXin (KEFLEX) 500 MG capsule Take 1 capsule (500 mg total) by mouth 3 (three) times daily for 10 days. 02/14/18 02/24/18  Lannie Fields, PA-C  citalopram (CELEXA) 40 MG tablet Take 40 mg by mouth daily.    [provider]  Cyanocobalamin (RA VITAMIN B-12 TR) 1000 MCG TBCR Take 1,000 mcg by mouth daily.     [provider]  donepezil (ARICEPT) 5 MG tablet Take 10 mg by mouth at bedtime.  10/18/14   [provider]  fluticasone (FLONASE) 50 MCG/ACT nasal spray Place into the nose. 03/03/17   [provider]  Fluticasone-Salmeterol (ADVAIR) 250-50 MCG/DOSE AEPB Inhale 1 puff into the lungs daily as needed.     [provider]  gabapentin (NEURONTIN) 300 MG capsule Take 300 mg by mouth 4 (four) times daily.     [provider]  HYDROcodone-acetaminophen (NORCO) 7.5-325 MG tablet 1 tablet by mouth q 6 h prn 11/14/16   [provider]  levothyroxine (SYNTHROID, LEVOTHROID) 50 MCG tablet Take 50 mcg by mouth daily before breakfast. Take 30 to 60 minutes before breakfast.    [provider]  memantine (NAMENDA) 5 MG tablet Take 5 mg by mouth 2 (two) times daily.  08/19/15 03/23/17  [provider]  montelukast (SINGULAIR) 10 MG tablet Take by mouth. 03/16/17 03/16/18  [provider]  Multiple Vitamins-Minerals (HAIR SKIN AND NAILS FORMULA PO) Take 1 tablet by mouth every evening.    [provider]  Multiple Vitamins-Minerals (OCUVITE-LUTEIN PO) Take 1 capsule by mouth daily.     [provider]  oxybutynin (DITROPAN) 5 MG tablet Take 5 mg by mouth 3 (three) times daily.    [provider]  pantoprazole (PROTONIX) 20 MG tablet Take 20 mg by mouth 2 (two) times daily.  07/24/15   [provider]  phenazopyridine (PYRIDIUM) 200 MG tablet Take 1 tablet (200 mg total) by mouth 3 (three) times daily as needed for pain. 08/31/17   Jacquelin Hawking, NP  polycarbophil (FIBERCON) 625 MG tablet Take by mouth. 03/02/17 03/02/18  [provider]  pravastatin (PRAVACHOL) 20 MG tablet Take 20 mg by mouth daily.    [provider]  prochlorperazine (COMPAZINE) 10 MG tablet Take 1 tablet (10 mg total) by mouth every 6 (six) hours as needed for nausea or vomiting. 11/25/17   Lloyd Huger, MD  traMADol (ULTRAM) 50 MG tablet Take 50 mg by mouth 3 (three) times daily as needed for moderate pain or severe pain. pain    [provider]  vitamin C (ASCORBIC ACID) 500 MG tablet Take 500 mg by mouth daily.    [provider]  vitamin E (E-400) 400 UNIT capsule Take 400 Units by mouth daily.    [provider]    Allergies Aspirin; Diazepam; Morphine; Other; and Tetanus toxoids  Family History  Problem  Relation Age of Onset  . Stroke Mother     Social History Social History   Tobacco Use  . Smoking status: Never Smoker  . Smokeless tobacco: Never Used  Substance Use Topics  . Alcohol use: No    Alcohol/week: 0.0 oz  . Drug use: No     Review of Systems  Constitutional: No fever/chills Eyes: No visual changes. No discharge ENT: No upper respiratory complaints. Cardiovascular: no chest pain. Respiratory: no cough. No SOB. Gastrointestinal: No abdominal pain.  No nausea, no vomiting.  No diarrhea.  No constipation. Musculoskeletal: Negative for musculoskeletal pain. Skin: Patient has skin tears.  Neurological: Negative for headaches, focal weakness or numbness.   ____________________________________________   PHYSICAL EXAM:  VITAL SIGNS: ED Triage Vitals  Enc Vitals Group     BP 02/14/18 1459 133/61     Pulse Rate 02/14/18 1459 64     Resp 02/14/18 1459 20     Temp 02/14/18 1459 99.1 F (37.3 C)  Temp Source 02/14/18 1459 Oral     SpO2 02/14/18 1459 95 %     Weight 02/14/18 1459 138 lb (62.6 kg)     Height 02/14/18 1459 5\' 6"  (1.676 m)     Head Circumference --      Peak Flow --      Pain Score 02/14/18 1502 10     Pain Loc --      Pain Edu? --      Excl. in [place]? --      Constitutional: Alert and oriented. Well appearing and in no acute distress. Eyes: Conjunctivae are normal. PERRL. EOMI. Head: Atraumatic. Cardiovascular: Normal rate, regular rhythm. Normal S1 and S2.  Good peripheral circulation. Respiratory: Normal respiratory effort without tachypnea or retractions. Lungs CTAB. Good air entry to the bases with no decreased or absent breath sounds. Musculoskeletal: Full range of motion to all extremities. No gross deformities appreciated. Neurologic:  Normal speech and language. No gross focal neurologic deficits are appreciated.  Skin: Patient has a 1-1/2 cm skin tear localized to the dorsum of the right index finger.  She has a 3 cm, jagged skin  tear between the second and third digits.  Palpable radial pulse, right. Psychiatric: Mood and affect are normal. Speech and behavior are normal. Patient exhibits appropriate insight and judgement.   ____________________________________________   LABS (all labs ordered are listed, but only abnormal results are displayed)  Labs Reviewed - No data to display ____________________________________________  EKG   ____________________________________________  RADIOLOGY Unk Pinto, personally viewed and evaluated these images (plain radiographs) as part of my medical decision making, as well as reviewing the written report by the radiologist.    Dg Hand Complete Right  Result Date: 02/14/2018 CLINICAL DATA:  Lacerations to the right hand and index and middle fingers today. Hit hand on a car mirror. EXAM: RIGHT HAND - COMPLETE 3+ VIEW COMPARISON:  09/09/2006 FINDINGS: No fracture.  No bone lesion. Joints are normally aligned. There are degenerative changes involving most of the interphalangeal joints most evident of the DIP joints and IP joint of the thumb. Soft tissues demonstrate mild swelling of the index and middle fingers, but no radiopaque foreign bodies. IMPRESSION: No fracture, dislocation or radiopaque foreign body. Electronically Signed   By: Lajean Manes M.D.   On: 02/14/2018 16:07    ____________________________________________    PROCEDURES  Procedure(s) performed:    Procedures  LACERATION REPAIR Performed by: Lannie Fields Authorized by: Lannie Fields Consent: Verbal consent obtained. Risks and benefits: risks, benefits and alternatives were discussed Consent given by: patient Patient identity confirmed: provided demographic data Prepped and Draped in normal sterile fashion Wound explored  Laceration Location: Right hand  Laceration Length:  Right Index Finger: 1.5 cm Left interdigit space: 3 cm  No Foreign Bodies seen or palpated  Anesthesia:  Topical  Local anesthetic: LET And Lidocaine 1% 3 mL  Anesthetic total: 9 ml  Irrigation method: syringe Amount of cleaning: Betadine and irrigation with normal saline.   Skin closure: 5-0 Ethilon   Number of sutures:   Right Index Finger: 7 Left interdigit space: 16   Technique: Simple Interrupted:   Patient tolerance: Patient tolerated the procedure well with no immediate complications.   Medications  lidocaine-EPINEPHrine-tetracaine (LET) solution (9 mLs Topical Given by Other 02/14/18 1630)  lidocaine (XYLOCAINE) 1 % (with pres) injection 5 mL (5 mLs Infiltration Given by Other 02/14/18 1745)     ____________________________________________   INITIAL IMPRESSION / ASSESSMENT  AND PLAN / ED COURSE  Pertinent labs & imaging results that were available during my care of the patient were reviewed by me and considered in my medical decision making (see chart for details).  Review of the [place] CSRS was performed in accordance of the [place] prior to dispensing any controlled drugs.    Assessment and Plan:  Laceration Patient presents to the emergency department with laceration sustained to the right hand.  Lacerations were repaired in the emergency department without complication.  Patient was advised to have sutures removed in 9 days.  Patient was discharged with Keflex.  Vital signs are reassuring prior to discharge.    ____________________________________________  FINAL CLINICAL IMPRESSION(S) / ED DIAGNOSES  Final diagnoses:  Laceration of right index finger without foreign body without damage to nail, initial encounter      NEW MEDICATIONS STARTED DURING THIS VISIT:  ED Discharge Orders        Ordered    cephALEXin (KEFLEX) 500 MG capsule  3 times daily     02/14/18 1707          This chart was dictated using voice recognition software/Dragon. Despite best efforts to proofread, errors can occur which can change the meaning. Any change was purely  unintentional.    Lannie Fields, PA-C 02/14/18 1842    Nance Pear, MD 02/14/18 1946

## 2018-02-17 ENCOUNTER — Inpatient Hospital Stay: Payer: Medicare HMO | Attending: Oncology

## 2018-02-17 ENCOUNTER — Inpatient Hospital Stay: Payer: Medicare HMO

## 2018-02-17 ENCOUNTER — Other Ambulatory Visit: Payer: Self-pay

## 2018-02-17 ENCOUNTER — Inpatient Hospital Stay (HOSPITAL_BASED_OUTPATIENT_CLINIC_OR_DEPARTMENT_OTHER): Payer: Medicare HMO | Admitting: Oncology

## 2018-02-17 VITALS — BP 162/75 | HR 63 | Temp 96.1°F | Resp 18 | Wt 143.0 lb

## 2018-02-17 DIAGNOSIS — F329 Major depressive disorder, single episode, unspecified: Secondary | ICD-10-CM | POA: Diagnosis not present

## 2018-02-17 DIAGNOSIS — R5382 Chronic fatigue, unspecified: Secondary | ICD-10-CM

## 2018-02-17 DIAGNOSIS — F0390 Unspecified dementia without behavioral disturbance: Secondary | ICD-10-CM

## 2018-02-17 DIAGNOSIS — R531 Weakness: Secondary | ICD-10-CM | POA: Diagnosis not present

## 2018-02-17 DIAGNOSIS — D693 Immune thrombocytopenic purpura: Secondary | ICD-10-CM | POA: Diagnosis not present

## 2018-02-17 LAB — CBC
HEMATOCRIT: 33.5 % — AB (ref 35.0–47.0)
Hemoglobin: 10.8 g/dL — ABNORMAL LOW (ref 12.0–16.0)
MCH: 27.5 pg (ref 26.0–34.0)
MCHC: 32.3 g/dL (ref 32.0–36.0)
MCV: 85 fL (ref 80.0–100.0)
Platelets: 40 10*3/uL — ABNORMAL LOW (ref 150–400)
RBC: 3.94 MIL/uL (ref 3.80–5.20)
RDW: 18.6 % — ABNORMAL HIGH (ref 11.5–14.5)
WBC: 12 10*3/uL — AB (ref 3.6–11.0)

## 2018-02-17 MED ORDER — ROMIPLOSTIM INJECTION 500 MCG
440.0000 ug | Freq: Once | SUBCUTANEOUS | Status: AC
  Administered 2018-02-17: 440 ug via SUBCUTANEOUS
  Filled 2018-02-17: qty 0.88

## 2018-02-17 NOTE — Progress Notes (Signed)
Pt here for follow up. Has 16 stitches on R hand after " getting excited I was allowed to drive and hit my hand on the mirror " husband drove her to ER -stitches to be removed by Dr Doy Hutching pa next WED.

## 2018-02-23 DIAGNOSIS — Z4802 Encounter for removal of sutures: Secondary | ICD-10-CM | POA: Diagnosis not present

## 2018-03-05 DIAGNOSIS — G4733 Obstructive sleep apnea (adult) (pediatric): Secondary | ICD-10-CM | POA: Diagnosis not present

## 2018-03-14 ENCOUNTER — Encounter: Payer: Self-pay | Admitting: Emergency Medicine

## 2018-03-14 ENCOUNTER — Other Ambulatory Visit: Payer: Self-pay

## 2018-03-14 ENCOUNTER — Emergency Department
Admission: EM | Admit: 2018-03-14 | Discharge: 2018-03-14 | Disposition: A | Discharge status: Home / Self Care | Payer: Medicare HMO | Attending: Emergency Medicine | Admitting: Emergency Medicine

## 2018-03-14 DIAGNOSIS — J45909 Unspecified asthma, uncomplicated: Secondary | ICD-10-CM | POA: Insufficient documentation

## 2018-03-14 DIAGNOSIS — W228XXA Striking against or struck by other objects, initial encounter: Secondary | ICD-10-CM | POA: Diagnosis not present

## 2018-03-14 DIAGNOSIS — Y939 Activity, unspecified: Secondary | ICD-10-CM | POA: Diagnosis not present

## 2018-03-14 DIAGNOSIS — Z79899 Other long term (current) drug therapy: Secondary | ICD-10-CM | POA: Diagnosis not present

## 2018-03-14 DIAGNOSIS — E7800 Pure hypercholesterolemia, unspecified: Secondary | ICD-10-CM | POA: Diagnosis not present

## 2018-03-14 DIAGNOSIS — I10 Essential (primary) hypertension: Secondary | ICD-10-CM | POA: Insufficient documentation

## 2018-03-14 DIAGNOSIS — S61411A Laceration without foreign body of right hand, initial encounter: Secondary | ICD-10-CM | POA: Diagnosis not present

## 2018-03-14 DIAGNOSIS — Y998 Other external cause status: Secondary | ICD-10-CM | POA: Insufficient documentation

## 2018-03-14 DIAGNOSIS — Y92009 Unspecified place in unspecified non-institutional (private) residence as the place of occurrence of the external cause: Secondary | ICD-10-CM | POA: Insufficient documentation

## 2018-03-14 DIAGNOSIS — D696 Thrombocytopenia, unspecified: Secondary | ICD-10-CM | POA: Diagnosis not present

## 2018-03-14 NOTE — Discharge Instructions (Signed)
Please follow-up as scheduled with Dr. Grayland Ormond.  Please try not to remove the tape or glue that is on your hand injury early; it will fall off on its own in 1 to 2 weeks.  If the tape starts to pull up, you can trim the edges with scissors so that it does not catch on anything.

## 2018-03-14 NOTE — ED Triage Notes (Signed)
Pt arrived to the ED accompanied by her husband for complaints of bleeding from a scratch for the last 2 hrs. Pt states that she has low platelets and that is why it doesn't stop bleeding. Pt is AOx4 in no apparent distress with minimal bleeding noticed during triage.

## 2018-03-14 NOTE — ED Provider Notes (Signed)
[facility] Emergency Department Provider Note  ____________________________________________   First MD Initiated Contact with Patient 03/14/18 2027     (approximate)  I have reviewed the triage vital signs and the nursing notes.   HISTORY  Chief Complaint Hand Injury and Coagulation Disorder    HPI Ariana [name] is a 84 y.o. female with a history of chronic thrombocytopenia for which she sees Dr. Grayland Ormond who presents today for evaluation of bleeding from a skin tear on the top of her right hand.  She bumped it against an object at home and tore the skin back a little bit which is common for her but it is difficult for her to stop bleeding due to her thrombocytopenia.  She and her husband applied pressure but it continued to ooze blood and they report that she was told by Dr. Grayland Ormond in the past that if she starts bleeding and wants up she should come to the emergency department.  She has no significant amount of pain from the wound and she is not feeling lightheaded or dizzy.  She denies chest pain, shortness of breath, nausea, and vomiting.  Past Medical History:  Diagnosis Date  . Anemia   . Anxiety   . Asthma   . Chronic back pain   . Depression   . Depression   . GERD (gastroesophageal reflux disease)   . Hiatal hernia   . Hypercholesteremia   . ITP (idiopathic thrombocytopenic purpura)   . Osteoarthritis   . Osteoporosis   . Pulmonary emboli ([place])   . Restless legs     Patient Active Problem List   Diagnosis Date Noted  . Asthma without status asthmaticus 11/25/2016  . Late onset Alzheimer's disease without behavioral disturbance 12/30/2015  . Back muscle spasm 09/09/2015  . Clinical depression 04/12/2015  . Herpes zona 04/12/2015  . Chronic ITP (idiopathic thrombocytopenic purpura) (HCC) 03/15/2015  . Chest pain 10/22/2014  . Breathlessness on exertion 10/22/2014  . Osteopenia 10/18/2014  . Benign essential HTN  06/28/2014  . HLD (hyperlipidemia) 06/28/2014  . Acquired hypothyroidism 06/04/2014  . Anxiety and depression 06/04/2014  . Bursitis, trochanteric 03/27/2014  . DDD (degenerative disc disease), lumbar 02/27/2014  . Neuritis or radiculitis due to rupture of lumbar intervertebral disc 02/27/2014    Past Surgical History:  Procedure Laterality Date  . CARPAL TUNNEL RELEASE    . ESOPHAGOGASTRODUODENOSCOPY (EGD) WITH PROPOFOL N/A 09/10/2015   Procedure: ESOPHAGOGASTRODUODENOSCOPY (EGD) WITH PROPOFOL;  Surgeon: Lollie Sails, MD;  Location: [facility] ENDOSCOPY;  Service: Endoscopy;  Laterality: N/A;  . LUMBAR [place]    . SPLENECTOMY, PARTIAL    . TOTAL ABDOMINAL HYSTERECTOMY      Prior to Admission medications   Medication Sig Start Date End Date Taking? Authorizing Provider  acetaminophen (TYLENOL) 325 MG tablet Take 2 tablets (650 mg total) by mouth every 6 (six) hours as needed for mild pain (or Fever >/= 101). 07/02/15   Idelle Crouch, MD  albuterol (PROVENTIL HFA;VENTOLIN HFA) 108 (90 BASE) MCG/ACT inhaler Inhale 2 puffs into the lungs every 6 (six) hours as needed for wheezing or shortness of breath.    [provider]  ALPRAZolam Duanne Moron) 0.5 MG tablet Take 0.5 mg by mouth 3 (three) times daily as needed for anxiety.  03/11/17   [provider]  buPROPion (WELLBUTRIN XL) 150 MG 24 hr tablet Take 150 mg by mouth daily.    [provider]  Calcium Carbonate-Vitamin D (CALCIUM 600+D) 600-400 MG-UNIT per  tablet Take 1 tablet by mouth daily.    [provider]  citalopram (CELEXA) 40 MG tablet Take 40 mg by mouth daily.    [provider]  Cyanocobalamin (RA VITAMIN B-12 TR) 1000 MCG TBCR Take 1,000 mcg by mouth daily.     [provider]  donepezil (ARICEPT) 5 MG tablet Take 10 mg by mouth at bedtime.  10/18/14   [provider]  fluticasone (FLONASE) 50 MCG/ACT nasal spray Place into the nose. 03/03/17   [provider]  Fluticasone-Salmeterol (ADVAIR) 250-50 MCG/DOSE AEPB Inhale 1 puff into the lungs daily as needed.     [provider]  gabapentin (NEURONTIN) 300 MG capsule Take 300 mg by mouth 4 (four) times daily.    [provider]  HYDROcodone-acetaminophen (NORCO) 7.5-325 MG tablet 1 tablet by mouth q 6 h prn 11/14/16   [provider]  levothyroxine (SYNTHROID, LEVOTHROID) 50 MCG tablet Take 50 mcg by mouth daily before breakfast. Take 30 to 60 minutes before breakfast.    [provider]  memantine (NAMENDA) 5 MG tablet Take 5 mg by mouth 2 (two) times daily.  08/19/15 03/23/17  [provider]  montelukast (SINGULAIR) 10 MG tablet Take by mouth. 03/16/17 03/16/18  [provider]  Multiple Vitamins-Minerals (HAIR SKIN AND NAILS FORMULA PO) Take 1 tablet by mouth every evening.    [provider]  Multiple Vitamins-Minerals (OCUVITE-LUTEIN PO) Take 1 capsule by mouth daily.     [provider]  oxybutynin (DITROPAN) 5 MG tablet Take 5 mg by mouth 3 (three) times daily.    [provider]  pantoprazole (PROTONIX) 20 MG tablet Take 20 mg by mouth 2 (two) times daily.  07/24/15   [provider]  phenazopyridine (PYRIDIUM) 200 MG tablet Take 1 tablet (200 mg total) by mouth 3 (three) times daily as needed for pain. 08/31/17   Jacquelin Hawking, NP  pravastatin (PRAVACHOL) 20 MG tablet Take 20 mg by mouth daily.    [provider]  predniSONE (DELTASONE) 10 MG tablet  01/31/18   [provider]  prochlorperazine (COMPAZINE) 10 MG tablet Take 1 tablet (10 mg total) by mouth every 6 (six) hours as needed for nausea or vomiting. Patient not taking: Reported on 02/17/2018 11/25/17   Lloyd Huger, MD  traMADol (ULTRAM) 50 MG tablet Take 50 mg by mouth 3 (three) times daily as needed for moderate pain or severe pain. pain    [provider]  vitamin C (ASCORBIC ACID) 500 MG tablet Take 500  mg by mouth daily.    [provider]  vitamin E (E-400) 400 UNIT capsule Take 400 Units by mouth daily.    [provider]    Allergies Aspirin; Diazepam; Morphine; Other; and Tetanus toxoids  Family History  Problem Relation Age of Onset  . Stroke Mother     Social History Social History   Tobacco Use  . Smoking status: Never Smoker  . Smokeless tobacco: Never Used  Substance Use Topics  . Alcohol use: No    Alcohol/week: 0.0 oz  . Drug use: No    Review of Systems Constitutional: No fever/chills Cardiovascular: Denies chest pain. Respiratory: Denies shortness of breath. Gastrointestinal: No abdominal pain.  No nausea, no vomiting.   Musculoskeletal: Negative for neck pain.  Negative for back pain. Integumentary: Skin tear on the back of the right hand that is continuing to bleed.  Negative for rash. Neurological: Negative for headaches, focal  weakness or numbness.   ____________________________________________   PHYSICAL EXAM:  VITAL SIGNS: ED Triage Vitals  Enc Vitals Group     BP 03/14/18 1921 (!) 131/101     Pulse Rate 03/14/18 1921 (!) 59     Resp 03/14/18 1921 18     Temp 03/14/18 1921 98.4 F (36.9 C)     Temp Source 03/14/18 1921 Oral     SpO2 03/14/18 1921 98 %     Weight 03/14/18 1922 63 kg (139 lb)     Height 03/14/18 1922 1.676 m (5\' 6" )     Head Circumference --      Peak Flow --      Pain Score 03/14/18 1921 0     Pain Loc --      Pain Edu? --      Excl. in [place]? --     Constitutional: Alert and oriented. Well appearing and in no acute distress. Eyes: Conjunctivae are normal.  Head: Atraumatic. Cardiovascular: Normal rate, regular rhythm. Good peripheral circulation.  Respiratory: Normal respiratory effort.  No retractions.  Musculoskeletal: No lower extremity tenderness nor edema. No gross deformities of extremities. Neurologic:  Normal speech and language. No gross focal neurologic deficits are appreciated.  Skin:   Skin is warm and dry.  She has an approximately 3- cm in total length V-shaped skin tear on the volar aspect of her right hand.  In triage a pressure dressing was placed on the wound and when I removed the dressing it was no longer bleeding and the skin flap was well approximated.  No hematoma, no swelling, only minor tenderness palpation. Psychiatric: Mood and affect are normal. Speech and behavior are normal.  ____________________________________________   LABS (all labs ordered are listed, but only abnormal results are displayed)  Labs Reviewed - No data to display ____________________________________________  EKG  None - EKG not ordered by ED physician ____________________________________________  RADIOLOGY   ED MD interpretation: No indication for imaging  Official radiology report(s): No results found.  ____________________________________________   PROCEDURES  Critical Care performed: No   Procedure(s) performed:   Marland KitchenMarland KitchenLaceration Repair Date/Time: 03/15/2018 2:24 AM Performed by: Hinda Kehr, MD Authorized by: Hinda Kehr, MD   Consent:    Consent obtained:  Verbal   Consent given by:  Patient Anesthesia (see MAR for exact dosages):    Anesthesia method:  None Laceration details:    Location:  Hand   Hand location:  R hand, dorsum   Length (cm):  3 Repair type:    Repair type:  Simple Exploration:    Contaminated: no   Treatment:    Amount of cleaning:  Standard   Irrigation solution:  Sterile saline   Visualized foreign bodies/material removed: no   Skin repair:    Repair method:  Tissue adhesive and Steri-Strips   Number of Steri-Strips:  3 Approximation:    Approximation:  Close Post-procedure details:    Dressing:  Open (no dressing)   Patient tolerance of procedure:  Tolerated well, no immediate complications     ____________________________________________   INITIAL IMPRESSION / ASSESSMENT AND PLAN / ED COURSE  As part of my  medical decision making, I reviewed the following data within the [place] notes reviewed and incorporated, Old chart reviewed and Notes from prior ED visits  Uncomplicated skin tear which is made only slightly more complex by the patient's history of thrombocytopenia.  However there is no need to investigate that further today as she is  regularly checked out in the oncology clinic.  Bleeding had stopped with a pressure dressing.  Since it was well approximated with an intact skin flap, I Dermabond the wound and then placed 3 Steri-Strips over the top to reinforce.  I had my usual and customary wound care discussion with the patient and her husband and they will follow-up as needed. I gave my usual and customary return precautions.      ____________________________________________  FINAL CLINICAL IMPRESSION(S) / ED DIAGNOSES  Final diagnoses:  Skin tear of right hand without complication, initial encounter  Thrombocytopenia ([place])     MEDICATIONS GIVEN DURING THIS VISIT:  Medications - No data to display   ED Discharge Orders    None       Note:  This document was prepared using Dragon voice recognition software and may include unintentional dictation errors.    Hinda Kehr, MD 03/15/18 [phone]

## 2018-03-14 NOTE — ED Notes (Signed)
Pt states uncontrolled bleeding from a scratch. Pt has bandage in place over right hand. No bleeding noted through bandage at this time. Pt denies any other issues.

## 2018-03-15 DIAGNOSIS — Z0000 Encounter for general adult medical examination without abnormal findings: Secondary | ICD-10-CM | POA: Diagnosis not present

## 2018-03-15 DIAGNOSIS — Z79899 Other long term (current) drug therapy: Secondary | ICD-10-CM | POA: Diagnosis not present

## 2018-03-15 DIAGNOSIS — E7800 Pure hypercholesterolemia, unspecified: Secondary | ICD-10-CM | POA: Diagnosis not present

## 2018-03-15 DIAGNOSIS — E039 Hypothyroidism, unspecified: Secondary | ICD-10-CM | POA: Diagnosis not present

## 2018-03-15 DIAGNOSIS — F0280 Dementia in other diseases classified elsewhere without behavioral disturbance: Secondary | ICD-10-CM | POA: Diagnosis not present

## 2018-03-15 DIAGNOSIS — F329 Major depressive disorder, single episode, unspecified: Secondary | ICD-10-CM | POA: Diagnosis not present

## 2018-03-15 DIAGNOSIS — G301 Alzheimer's disease with late onset: Secondary | ICD-10-CM | POA: Diagnosis not present

## 2018-03-15 DIAGNOSIS — F419 Anxiety disorder, unspecified: Secondary | ICD-10-CM | POA: Diagnosis not present

## 2018-03-15 DIAGNOSIS — I10 Essential (primary) hypertension: Secondary | ICD-10-CM | POA: Diagnosis not present

## 2018-03-19 NOTE — Progress Notes (Signed)
[place]  [phone]  [phone]     Ariana [name] DOB: Dec 13, 1932  MR#: [id number]  OFH#:[id number]  Patient Care Team: Idelle Crouch, MD as PCP - General (Internal Medicine)  CHIEF COMPLAINT: Chronic refractory ITP  INTERVAL HISTORY: Patient returns to clinic today for repeat laboratory work and consideration of Nplate.  She recently was seen in the emergency room with extensive bleeding after cutting her hand.  This is now resolved and she is back to her baseline.  She has had no other bleeding. She does not complain of weakness or fatigue today.  She continues to have easy bruising.  She has no neurologic complaints. She denies any fevers. She has no chest pain or shortness of breath. She denies any nausea, vomiting, constipation, or diarrhea.  She has no melena or hematochezia.  She has no urinary complaints.  Patient offers no further specific complaints today.  REVIEW OF SYSTEMS:   Review of Systems  Constitutional: Negative.  Negative for fever, malaise/fatigue and weight loss.  HENT: Negative for congestion.   Respiratory: Negative.  Negative for cough, hemoptysis and shortness of breath.   Cardiovascular: Negative.  Negative for chest pain and leg swelling.  Gastrointestinal: Negative.  Negative for abdominal pain, blood in stool, diarrhea, melena and nausea.  Genitourinary: Negative.  Negative for dysuria and hematuria.  Musculoskeletal: Negative.   Skin: Negative.  Negative for rash.  Neurological: Negative.  Negative for sensory change, focal weakness and weakness.  Endo/Heme/Allergies: Bruises/bleeds easily.  Psychiatric/Behavioral: Negative.  Negative for depression and memory loss. The patient is not nervous/anxious.     As per HPI. Otherwise, a complete review of systems is negative.  PAST MEDICAL HISTORY: Past Medical History:  Diagnosis Date  . Anemia   . Anxiety   . Asthma   . Chronic back pain   .  Depression   . Depression   . GERD (gastroesophageal reflux disease)   . Hiatal hernia   . Hypercholesteremia   . ITP (idiopathic thrombocytopenic purpura)   . Osteoarthritis   . Osteoporosis   . Pulmonary emboli ([place])   . Restless legs     PAST SURGICAL HISTORY: Past Surgical History:  Procedure Laterality Date  . CARPAL TUNNEL RELEASE    . ESOPHAGOGASTRODUODENOSCOPY (EGD) WITH PROPOFOL N/A 09/10/2015   Procedure: ESOPHAGOGASTRODUODENOSCOPY (EGD) WITH PROPOFOL;  Surgeon: Lollie Sails, MD;  Location: [facility] ENDOSCOPY;  Service: Endoscopy;  Laterality: N/A;  . LUMBAR [place]    . SPLENECTOMY, PARTIAL    . TOTAL ABDOMINAL HYSTERECTOMY      FAMILY HISTORY Family History  Problem Relation Age of Onset  . Stroke Mother     GYNECOLOGIC HISTORY:  No LMP recorded. Patient is postmenopausal.     ADVANCED DIRECTIVES:    HEALTH MAINTENANCE: Social History   Tobacco Use  . Smoking status: Never Smoker  . Smokeless tobacco: Never Used  Substance Use Topics  . Alcohol use: No    Alcohol/week: 0.0 oz  . Drug use: No     Allergies  Allergen Reactions  . Aspirin     Other reaction(s): Distress (finding)  . Diazepam     Other reaction(s): Itching of Skin sneezing  . Morphine     Other reaction(s): Itching of Skin  . Other     Other reaction(s): Unknown seasonal allergies  . Tetanus Toxoids     Other reaction(s): Localized superficial swelling of skin    Current Outpatient Medications  Medication Sig Dispense  Refill  . acetaminophen (TYLENOL) 325 MG tablet Take 2 tablets (650 mg total) by mouth every 6 (six) hours as needed for mild pain (or Fever >/= 101). 100 tablet 0  . albuterol (PROVENTIL HFA;VENTOLIN HFA) 108 (90 BASE) MCG/ACT inhaler Inhale 2 puffs into the lungs every 6 (six) hours as needed for wheezing or shortness of breath.    . ALPRAZolam (XANAX) 0.5 MG tablet Take 0.5 mg by mouth 3 (three) times daily as needed for anxiety.     Marland Kitchen buPROPion  (WELLBUTRIN XL) 150 MG 24 hr tablet Take 150 mg by mouth daily.    . Calcium Carbonate-Vitamin D (CALCIUM 600+D) 600-400 MG-UNIT per tablet Take 1 tablet by mouth daily.    . citalopram (CELEXA) 40 MG tablet Take 40 mg by mouth daily.    . Cyanocobalamin (RA VITAMIN B-12 TR) 1000 MCG TBCR Take 1,000 mcg by mouth daily.     Marland Kitchen donepezil (ARICEPT) 5 MG tablet Take 10 mg by mouth at bedtime.     . fluticasone (FLONASE) 50 MCG/ACT nasal spray Place into the nose.    Marland Kitchen Fluticasone-Salmeterol (ADVAIR) 250-50 MCG/DOSE AEPB Inhale 1 puff into the lungs daily as needed.     . gabapentin (NEURONTIN) 300 MG capsule Take 300 mg by mouth 4 (four) times daily.    Marland Kitchen HYDROcodone-acetaminophen (NORCO) 7.5-325 MG tablet 1 tablet by mouth q 6 h prn    . levothyroxine (SYNTHROID, LEVOTHROID) 50 MCG tablet Take 50 mcg by mouth daily before breakfast. Take 30 to 60 minutes before breakfast.    . memantine (NAMENDA) 5 MG tablet Take 5 mg by mouth 2 (two) times daily.     . montelukast (SINGULAIR) 10 MG tablet Take by mouth.    . Multiple Vitamins-Minerals (HAIR SKIN AND NAILS FORMULA PO) Take 1 tablet by mouth every evening.    . Multiple Vitamins-Minerals (OCUVITE-LUTEIN PO) Take 1 capsule by mouth daily.     Marland Kitchen oxybutynin (DITROPAN) 5 MG tablet Take 5 mg by mouth 3 (three) times daily.    . pantoprazole (PROTONIX) 20 MG tablet Take 20 mg by mouth 2 (two) times daily.     . phenazopyridine (PYRIDIUM) 200 MG tablet Take 1 tablet (200 mg total) by mouth 3 (three) times daily as needed for pain. 15 tablet 0  . pravastatin (PRAVACHOL) 20 MG tablet Take 20 mg by mouth daily.    . predniSONE (DELTASONE) 10 MG tablet   1  . prochlorperazine (COMPAZINE) 10 MG tablet Take 1 tablet (10 mg total) by mouth every 6 (six) hours as needed for nausea or vomiting. 30 tablet 2  . traMADol (ULTRAM) 50 MG tablet Take 50 mg by mouth 3 (three) times daily as needed for moderate pain or severe pain. pain    . vitamin C (ASCORBIC ACID) 500 MG  tablet Take 500 mg by mouth daily.    . vitamin E (E-400) 400 UNIT capsule Take 400 Units by mouth daily.     No current facility-administered medications for this visit.     OBJECTIVE: BP 126/72 (BP Location: Left Arm, Patient Position: Sitting)   Pulse 69   Temp 97.6 F (36.4 C) (Tympanic)   Resp 12   Ht 5\' 2"  (1.575 m)   Wt 146 lb 1.6 oz (66.3 kg)   BMI 26.72 kg/m    Body mass index is 26.72 kg/m.    ECOG FS:1 - Symptomatic but completely ambulatory  General: Well-developed, well-nourished, no acute distress. Eyes: Pink conjunctiva, anicteric  sclera. Lungs: Clear to auscultation bilaterally. Heart: Regular rate and rhythm. No rubs, murmurs, or gallops. Abdomen: Soft, nontender, nondistended. No organomegaly noted, normoactive bowel sounds. Musculoskeletal: No edema, cyanosis, or clubbing.  Healing scar on back of right hand from recent injury. Neuro: Alert, answering all questions appropriately. Cranial nerves grossly intact. Skin: No rashes or petechiae noted.  Bruising on bilateral forearms. Psych: Normal affect.  LAB RESULTS:  Orders Only on 03/21/2018  Component Date Value Ref Range Status  . WBC 03/21/2018 9.5  3.6 - 11.0 K/uL Final  . RBC 03/21/2018 3.77* 3.80 - 5.20 MIL/uL Final  . Hemoglobin 03/21/2018 10.3* 12.0 - 16.0 g/dL Final  . HCT 03/21/2018 31.8* 35.0 - 47.0 % Final  . MCV 03/21/2018 84.3  80.0 - 100.0 fL Final  . MCH 03/21/2018 27.3  26.0 - 34.0 pg Final  . MCHC 03/21/2018 32.4  32.0 - 36.0 g/dL Final  . RDW 03/21/2018 18.5* 11.5 - 14.5 % Final  . Platelets 03/21/2018 16* 150 - 400 K/uL Final   Comment: RESULT REPEATED AND VERIFIED PLATELET COUNT CONFIRMED BY SMEAR CRITICAL RESULT CALLED TO, READ BACK BY AND VERIFIED WITH: [place] @ 10:25AM 03/21/2018 BY LGR Performed at [facility], [address] [place], [place] [address]     STUDIES: No results found.  ASSESSMENT:  Chronic refractory ITP  PLAN:   1. Chronic refractory ITP:  Patient had a poor response to Prednisone, WinRho, and Rituxan. She is status post splenectomy in 2007.  Her platelet count is less than 100 at 16 today.  Suspect her platelet count is further decreased than usual secondary to her recent bleed and consumption of platelets.  Proceed with 7 mcg/kg Nplate today. Return to clinic in 4 weeks for laboratory work, further evaluation, and consideration of Nplate if her platelet count continues to be less than 100. Patient appears to only requiring injections every 4 weeks, although this could possibly extended to every 6 weeks if her nadir continues to increase. Can consider Promacta 12.5mg  at a later date if necessary. Max dose of Nplate is 10 mcg/kg. 2. Weakness and fatigue: Chronic and unchanged.  Although she feels improved after receiving her injection. 3. Depression/dementia: Seems improved today.  Monitor. Continue follow-up with primary care. 4.  Right hand injury: Improving.  Approximately 30 minutes spent in discussion of which greater than 50% consultation.  Patient expressed understanding and was in agreement with this plan. She also understands that She can call clinic at any time with any questions, concerns, or complaints.    Lloyd Huger, MD   03/21/2018 1:51 PM

## 2018-03-21 ENCOUNTER — Inpatient Hospital Stay: Payer: Medicare HMO | Attending: Oncology

## 2018-03-21 ENCOUNTER — Other Ambulatory Visit: Payer: Self-pay

## 2018-03-21 ENCOUNTER — Inpatient Hospital Stay (HOSPITAL_BASED_OUTPATIENT_CLINIC_OR_DEPARTMENT_OTHER): Payer: Medicare HMO | Admitting: Oncology

## 2018-03-21 ENCOUNTER — Encounter: Payer: Self-pay | Admitting: Oncology

## 2018-03-21 ENCOUNTER — Inpatient Hospital Stay: Payer: Medicare HMO

## 2018-03-21 VITALS — BP 126/72 | HR 69 | Temp 97.6°F | Resp 12 | Ht 62.0 in | Wt 146.1 lb

## 2018-03-21 DIAGNOSIS — D693 Immune thrombocytopenic purpura: Secondary | ICD-10-CM

## 2018-03-21 DIAGNOSIS — R5382 Chronic fatigue, unspecified: Secondary | ICD-10-CM | POA: Diagnosis not present

## 2018-03-21 DIAGNOSIS — F329 Major depressive disorder, single episode, unspecified: Secondary | ICD-10-CM

## 2018-03-21 DIAGNOSIS — R531 Weakness: Secondary | ICD-10-CM

## 2018-03-21 DIAGNOSIS — F0390 Unspecified dementia without behavioral disturbance: Secondary | ICD-10-CM | POA: Diagnosis not present

## 2018-03-21 LAB — CBC
HCT: 31.8 % — ABNORMAL LOW (ref 35.0–47.0)
Hemoglobin: 10.3 g/dL — ABNORMAL LOW (ref 12.0–16.0)
MCH: 27.3 pg (ref 26.0–34.0)
MCHC: 32.4 g/dL (ref 32.0–36.0)
MCV: 84.3 fL (ref 80.0–100.0)
PLATELETS: 16 10*3/uL — AB (ref 150–400)
RBC: 3.77 MIL/uL — ABNORMAL LOW (ref 3.80–5.20)
RDW: 18.5 % — AB (ref 11.5–14.5)
WBC: 9.5 10*3/uL (ref 3.6–11.0)

## 2018-03-21 MED ORDER — ROMIPLOSTIM INJECTION 500 MCG
7.0000 ug/kg | Freq: Once | SUBCUTANEOUS | Status: AC
Start: 2018-03-21 — End: 2018-03-21
  Administered 2018-03-21: 465 ug via SUBCUTANEOUS
  Filled 2018-03-21: qty 0.93

## 2018-03-21 NOTE — Progress Notes (Signed)
Patient here for follow up no changes since last appt 

## 2018-04-16 NOTE — Progress Notes (Deleted)
[place]  [phone]  [phone]     Ariana [name] DOB: [birth year]  MR#: [id number]  GHW#:[id number]  Patient Care Team: Idelle Crouch, MD as PCP - General (Internal Medicine)  CHIEF COMPLAINT: Chronic refractory ITP  INTERVAL HISTORY: Patient returns to clinic today for repeat laboratory work and consideration of Nplate.  She recently was seen in the emergency room with extensive bleeding after cutting her hand.  This is now resolved and she is back to her baseline.  She has had no other bleeding. She does not complain of weakness or fatigue today.  She continues to have easy bruising.  She has no neurologic complaints. She denies any fevers. She has no chest pain or shortness of breath. She denies any nausea, vomiting, constipation, or diarrhea.  She has no melena or hematochezia.  She has no urinary complaints.  Patient offers no further specific complaints today.  REVIEW OF SYSTEMS:   Review of Systems  Constitutional: Negative.  Negative for fever, malaise/fatigue and weight loss.  HENT: Negative for congestion.   Respiratory: Negative.  Negative for cough, hemoptysis and shortness of breath.   Cardiovascular: Negative.  Negative for chest pain and leg swelling.  Gastrointestinal: Negative.  Negative for abdominal pain, blood in stool, diarrhea, melena and nausea.  Genitourinary: Negative.  Negative for dysuria and hematuria.  Musculoskeletal: Negative.   Skin: Negative.  Negative for rash.  Neurological: Negative.  Negative for sensory change, focal weakness and weakness.  Endo/Heme/Allergies: Bruises/bleeds easily.  Psychiatric/Behavioral: Negative.  Negative for depression and memory loss. The patient is not nervous/anxious.     As per HPI. Otherwise, a complete review of systems is negative.  PAST MEDICAL HISTORY: Past Medical History:  Diagnosis Date  . Anemia   . Anxiety   . Asthma   . Chronic back pain   .  Depression   . Depression   . GERD (gastroesophageal reflux disease)   . Hiatal hernia   . Hypercholesteremia   . ITP (idiopathic thrombocytopenic purpura)   . Osteoarthritis   . Osteoporosis   . Pulmonary emboli ([place])   . Restless legs     PAST SURGICAL HISTORY: Past Surgical History:  Procedure Laterality Date  . CARPAL TUNNEL RELEASE    . ESOPHAGOGASTRODUODENOSCOPY (EGD) WITH PROPOFOL N/A 09/10/2015   Procedure: ESOPHAGOGASTRODUODENOSCOPY (EGD) WITH PROPOFOL;  Surgeon: Lollie Sails, MD;  Location[facility] END[facility]COPY;  Service: Endoscopy;  Laterality: N/A;  . LUMBAR [place]    . SPLENECTOMY, PARTIAL    . TOTAL ABDOMINAL HYSTERECTOMY      FAMILY HISTORY Family History  Problem Relation Age of Onset  . Stroke Mother     GYNECOLOGIC HISTORY:  No LMP recorded. Patient is postmenopausal.     ADVANCED DIRECTIVES:    HEALTH MAINTENANCE: Social History   Tobacco Use  . Smoking status: Never Smoker  . Smokeless tobacco: Never Used  Substance Use Topics  . Alcohol use: No    Alcohol/week: 0.0 oz  . Drug use: No     Allergies  Allergen Reactions  . Aspirin     Other reaction(s): Distress (finding)  . Diazepam     Other reaction(s): Itching of Skin sneezing  . Morphine     Other reaction(s): Itching of Skin  . Other     Other reaction(s): Unknown seasonal allergies  . Tetanus Toxoids     Other reaction(s): Localized superficial swelling of skin    Current Outpatient Medications  Medication Sig Dispense  Refill  . acetaminophen (TYLENOL) 325 MG tablet Take 2 tablets (650 mg total) by mouth every 6 (six) hours as needed for mild pain (or Fever >/= 101). 100 tablet 0  . albuterol (PROVENTIL HFA;VENTOLIN HFA) 108 (90 BASE) MCG/ACT inhaler Inhale 2 puffs into the lungs every 6 (six) hours as needed for wheezing or shortness of breath.    . ALPRAZolam (XANAX) 0.5 MG tablet Take 0.5 mg by mouth 3 (three) times daily as needed for anxiety.     Marland Kitchen buPROPion  (WELLBUTRIN XL) 150 MG 24 hr tablet Take 150 mg by mouth daily.    . Calcium Carbonate-Vitamin D (CALCIUM 600+D) 600-400 MG-UNIT per tablet Take 1 tablet by mouth daily.    . citalopram (CELEXA) 40 MG tablet Take 40 mg by mouth daily.    . Cyanocobalamin (RA VITAMIN B-12 TR) 1000 MCG TBCR Take 1,000 mcg by mouth daily.     Marland Kitchen donepezil (ARICEPT) 5 MG tablet Take 10 mg by mouth at bedtime.     . fluticasone (FLONASE) 50 MCG/ACT nasal spray Place into the nose.    Marland Kitchen Fluticasone-Salmeterol (ADVAIR) 250-50 MCG/DOSE AEPB Inhale 1 puff into the lungs daily as needed.     . gabapentin (NEURONTIN) 300 MG capsule Take 300 mg by mouth 4 (four) times daily.    Marland Kitchen HYDROcodone-acetaminophen (NORCO) 7.5-325 MG tablet 1 tablet by mouth q 6 h prn    . levothyroxine (SYNTHROID, LEVOTHROID) 50 MCG tablet Take 50 mcg by mouth daily before breakfast. Take 30 to 60 minutes before breakfast.    . memantine (NAMENDA) 5 MG tablet Take 5 mg by mouth 2 (two) times daily.     . montelukast (SINGULAIR) 10 MG tablet Take by mouth.    . Multiple Vitamins-Minerals (HAIR SKIN AND NAILS FORMULA PO) Take 1 tablet by mouth every evening.    . Multiple Vitamins-Minerals (OCUVITE-LUTEIN PO) Take 1 capsule by mouth daily.     Marland Kitchen oxybutynin (DITROPAN) 5 MG tablet Take 5 mg by mouth 3 (three) times daily.    . pantoprazole (PROTONIX) 20 MG tablet Take 20 mg by mouth 2 (two) times daily.     . phenazopyridine (PYRIDIUM) 200 MG tablet Take 1 tablet (200 mg total) by mouth 3 (three) times daily as needed for pain. 15 tablet 0  . pravastatin (PRAVACHOL) 20 MG tablet Take 20 mg by mouth daily.    . predniSONE (DELTASONE) 10 MG tablet   1  . prochlorperazine (COMPAZINE) 10 MG tablet Take 1 tablet (10 mg total) by mouth every 6 (six) hours as needed for nausea or vomiting. 30 tablet 2  . traMADol (ULTRAM) 50 MG tablet Take 50 mg by mouth 3 (three) times daily as needed for moderate pain or severe pain. pain    . vitamin C (ASCORBIC ACID) 500 MG  tablet Take 500 mg by mouth daily.    . vitamin E (E-400) 400 UNIT capsule Take 400 Units by mouth daily.     No current facility-administered medications for this visit.     OBJECTIVE: There were no vitals taken for this visit.   There is no height or weight on file to calculate BMI.    ECOG FS:1 - Symptomatic but completely ambulatory  General: Well-developed, well-nourished, no acute distress. Eyes: Pink conjunctiva, anicteric sclera. Lungs: Clear to auscultation bilaterally. Heart: Regular rate and rhythm. No rubs, murmurs, or gallops. Abdomen: Soft, nontender, nondistended. No organomegaly noted, normoactive bowel sounds. Musculoskeletal: No edema, cyanosis, or clubbing.  Healing  scar on back of right hand from recent injury. Neuro: Alert, answering all questions appropriately. Cranial nerves grossly intact. Skin: No rashes or petechiae noted.  Bruising on bilateral forearms. Psych: Normal affect.  LAB RESULTS:  No visits with results within 3 Day(s) from this visit.  Latest known visit with results is:  Orders Only on 03/21/2018  Component Date Value Ref Range Status  . WBC 03/21/2018 9.5  3.6 - 11.0 K/uL Final  . RBC 03/21/2018 3.77* 3.80 - 5.20 MIL/uL Final  . Hemoglobin 03/21/2018 10.3* 12.0 - 16.0 g/dL Final  . HCT 03/21/2018 31.8* 35.0 - 47.0 % Final  . MCV 03/21/2018 84.3  80.0 - 100.0 fL Final  . MCH 03/21/2018 27.3  26.0 - 34.0 pg Final  . MCHC 03/21/2018 32.4  32.0 - 36.0 g/dL Final  . RDW 03/21/2018 18.5* 11.5 - 14.5 % Final  . Platelets 03/21/2018 16* 150 - 400 K/uL Final   Comment: RESULT REPEATED AND VERIFIED PLATELET COUNT CONFIRMED BY SMEAR CRITICAL RESULT CALLED TO, READ BACK BY AND VERIFIED WITH: [place] @ 10:25AM 03/21/2018 BY LGR Performed at [facility], [address] [place], [place] [address]     STUDIES: No results found.  ASSESSMENT:  Chronic refractory ITP  PLAN:   1. Chronic refractory ITP: Patient had a poor response to  Prednisone, WinRho, and Rituxan. She is status post splenectomy in 2007.  Her platelet count is less than 100 at 16 today.  Suspect her platelet count is further decreased than usual secondary to her recent bleed and consumption of platelets.  Proceed with 7 mcg/kg Nplate today. Return to clinic in 4 weeks for laboratory work, further evaluation, and consideration of Nplate if her platelet count continues to be less than 100. Patient appears to only requiring injections every 4 weeks, although this could possibly extended to every 6 weeks if her nadir continues to increase. Can consider Promacta 12.5mg  at a later date if necessary. Max dose of Nplate is 10 mcg/kg. 2. Weakness and fatigue: Chronic and unchanged.  Although she feels improved after receiving her injection. 3. Depression/dementia: Seems improved today.  Monitor. Continue follow-up with primary care. 4.  Right hand injury: Improving.  Approximately 30 minutes spent in discussion of which greater than 50% consultation.  Patient expressed understanding and was in agreement with this plan. She also understands that She can call clinic at any time with any questions, concerns, or complaints.    Lloyd Huger, MD   04/16/2018 10:10 AM

## 2018-04-17 ENCOUNTER — Other Ambulatory Visit: Payer: Self-pay

## 2018-04-17 ENCOUNTER — Encounter: Payer: Self-pay | Admitting: Emergency Medicine

## 2018-04-17 ENCOUNTER — Emergency Department
Admission: EM | Admit: 2018-04-17 | Discharge: 2018-04-17 | Disposition: A | Discharge status: Home / Self Care | Payer: Medicare HMO | Attending: Emergency Medicine | Admitting: Emergency Medicine

## 2018-04-17 DIAGNOSIS — Y929 Unspecified place or not applicable: Secondary | ICD-10-CM | POA: Insufficient documentation

## 2018-04-17 DIAGNOSIS — W548XXA Other contact with dog, initial encounter: Secondary | ICD-10-CM | POA: Insufficient documentation

## 2018-04-17 DIAGNOSIS — S81811A Laceration without foreign body, right lower leg, initial encounter: Secondary | ICD-10-CM | POA: Insufficient documentation

## 2018-04-17 DIAGNOSIS — Y999 Unspecified external cause status: Secondary | ICD-10-CM | POA: Diagnosis not present

## 2018-04-17 DIAGNOSIS — E039 Hypothyroidism, unspecified: Secondary | ICD-10-CM | POA: Diagnosis not present

## 2018-04-17 DIAGNOSIS — Y939 Activity, unspecified: Secondary | ICD-10-CM | POA: Insufficient documentation

## 2018-04-17 DIAGNOSIS — J45909 Unspecified asthma, uncomplicated: Secondary | ICD-10-CM | POA: Insufficient documentation

## 2018-04-17 DIAGNOSIS — S8991XA Unspecified injury of right lower leg, initial encounter: Secondary | ICD-10-CM | POA: Diagnosis present

## 2018-04-17 NOTE — ED Provider Notes (Signed)
[facility] Emergency Department Provider Note       Time seen: ----------------------------------------- 8:09 AM on 04/17/2018 -----------------------------------------   I have reviewed the triage vital signs and the nursing notes.  HISTORY   Chief Complaint Laceration    HPI Ariana [name] is a 84 y.o. female with a history of anemia, anxiety, depression, GERD, hyperlipidemia, ITP and PEs who presents to the ED for right lower extremity laceration.  Patient reportedly was scratched by her dog and had a skin tear with oozing.  With her history of thrombocytopenia they were not able to get completely stopped regarding her bleeding.  She denies any other injuries or complaints.  Past Medical History:  Diagnosis Date  . Anemia   . Anxiety   . Asthma   . Chronic back pain   . Depression   . Depression   . GERD (gastroesophageal reflux disease)   . Hiatal hernia   . Hypercholesteremia   . ITP (idiopathic thrombocytopenic purpura)   . Osteoarthritis   . Osteoporosis   . Pulmonary emboli ([place])   . Restless legs     Patient Active Problem List   Diagnosis Date Noted  . Asthma without status asthmaticus 11/25/2016  . Late onset Alzheimer's disease without behavioral disturbance 12/30/2015  . Back muscle spasm 09/09/2015  . Clinical depression 04/12/2015  . Herpes zona 04/12/2015  . Chronic ITP (idiopathic thrombocytopenic purpura) (HCC) 03/15/2015  . Chest pain 10/22/2014  . Breathlessness on exertion 10/22/2014  . Osteopenia 10/18/2014  . Benign essential HTN 06/28/2014  . HLD (hyperlipidemia) 06/28/2014  . Acquired hypothyroidism 06/04/2014  . Anxiety and depression 06/04/2014  . Bursitis, trochanteric 03/27/2014  . DDD (degenerative disc disease), lumbar 02/27/2014  . Neuritis or radiculitis due to rupture of lumbar intervertebral disc 02/27/2014    Past Surgical History:  Procedure Laterality Date  . CARPAL TUNNEL RELEASE     . ESOPHAGOGASTRODUODENOSCOPY (EGD) WITH PROPOFOL N/A 09/10/2015   Procedure: ESOPHAGOGASTRODUODENOSCOPY (EGD) WITH PROPOFOL;  Surgeon: Lollie Sails, MD;  Location: [facility] ENDOSCOPY;  Service: Endoscopy;  Laterality: N/A;  . LUMBAR [place]    . SPLENECTOMY, PARTIAL    . TOTAL ABDOMINAL HYSTERECTOMY      Allergies Aspirin; Diazepam; Morphine; Other; and Tetanus toxoids  Social History Social History   Tobacco Use  . Smoking status: Never Smoker  . Smokeless tobacco: Never Used  Substance Use Topics  . Alcohol use: No    Alcohol/week: 0.0 oz  . Drug use: No   Review of Systems Constitutional: Negative for fever. Gastrointestinal: Negative for abdominal pain, vomiting and diarrhea. Musculoskeletal: Positive for right lower extremity laceration Skin: Positive for laceration Neurological: Negative for headaches, focal weakness or numbness.  All systems negative/normal/unremarkable except as stated in the HPI  ____________________________________________   PHYSICAL EXAM:  VITAL SIGNS: ED Triage Vitals  Enc Vitals Group     BP 04/17/18 0756 (!) 152/71     Pulse Rate 04/17/18 0756 (!) 56     Resp 04/17/18 0756 16     Temp 04/17/18 0756 98.7 F (37.1 C)     Temp Source 04/17/18 0756 Oral     SpO2 04/17/18 0756 98 %     Weight 04/17/18 0751 138 lb (62.6 kg)     Height 04/17/18 0751 5\' 6"  (1.676 m)     Head Circumference --      Peak Flow --      Pain Score --      Pain Loc --  Pain Edu? --      Excl. in [place]? --    Constitutional: Alert and oriented. Well appearing and in no distress. Eyes: Conjunctivae are normal. Normal extraocular movements. Musculoskeletal: V-shaped laceration on the right lower extremity below the knee laterally Neurologic:  Normal speech and language. No gross focal neurologic deficits are appreciated.  Skin: V-shaped laceration that is superficial on the right lower extremity laterally.  Slight bleeding is noted Psychiatric: Mood and  affect are normal. Speech and behavior are normal.  ____________________________________________  ED COURSE:  As part of my medical decision making, I reviewed the following data within the [place] History obtained from family if available, nursing notes, old chart and ekg, as well as notes from prior ED visits. Patient presented for bleeding from the right lower extremity, she will require wound closure most likely with Dermabond.   Marland Kitchen.Laceration Repair Date/Time: 04/17/2018 8:11 AM Performed by: Earleen Newport, MD Authorized by: Earleen Newport, MD   Consent:    Consent obtained:  Verbal   Consent given by:  Patient Laceration details:    Location:  Leg   Leg location:  R lower leg   Length (cm):  4   Depth (mm):  2 Repair type:    Repair type:  Simple Exploration:    Contaminated: no   Treatment:    Amount of cleaning:  Standard   Irrigation solution:  Tap water   Irrigation method:  Tap   Visualized foreign bodies/material removed: no   Skin repair:    Repair method:  Tissue adhesive Approximation:    Approximation:  Close Post-procedure details:    Dressing:  Sterile dressing   Patient tolerance of procedure:  Tolerated well, no immediate complications   ____________________________________________  DIFFERENTIAL DIAGNOSIS   Laceration, thrombus cytopenia  FINAL ASSESSMENT AND PLAN  Right lower extremity laceration   Plan: The patient had presented for a laceration sustained from a dog scratching her leg.  Wound was closed with satisfactory result as dictated above.  She is cleared for outpatient follow-up.   Laurence Aly, MD   Note: This note was generated in part or whole with voice recognition software. Voice recognition is usually quite accurate but there are transcription errors that can and very often do occur. I apologize for any typographical errors that were not detected and corrected.     Earleen Newport, MD 04/17/18 [phone]

## 2018-04-17 NOTE — ED Notes (Signed)
Triage note: Pt arrived via POV with reports of being scratched by her dog last night. Pt has hx of low platelets and is due to go to the cancer center [facility] for an implant.  Pt has generalized ecchymosis on arms.  Pt states the bleeding continues despite using bandaids and gauze.

## 2018-04-17 NOTE — ED Notes (Signed)
Pt states her dog scratched her RLE last night and pt has a skin tear with oozing. Pt has a hx of thrombocytopenia and was not able to get the bleeding to stop,. EDP at the bedside,

## 2018-04-18 ENCOUNTER — Inpatient Hospital Stay: Payer: Medicare HMO

## 2018-04-18 ENCOUNTER — Inpatient Hospital Stay: Payer: Medicare HMO | Admitting: Oncology

## 2018-04-19 DIAGNOSIS — R05 Cough: Secondary | ICD-10-CM | POA: Diagnosis not present

## 2018-04-20 ENCOUNTER — Encounter: Payer: Self-pay | Admitting: Oncology

## 2018-04-20 ENCOUNTER — Inpatient Hospital Stay: Payer: Medicare HMO | Attending: Oncology

## 2018-04-20 ENCOUNTER — Inpatient Hospital Stay (HOSPITAL_BASED_OUTPATIENT_CLINIC_OR_DEPARTMENT_OTHER): Payer: Medicare HMO | Admitting: Oncology

## 2018-04-20 ENCOUNTER — Inpatient Hospital Stay: Payer: Medicare HMO

## 2018-04-20 VITALS — BP 111/69 | HR 64 | Temp 97.0°F | Resp 18 | Wt 140.4 lb

## 2018-04-20 DIAGNOSIS — R5382 Chronic fatigue, unspecified: Secondary | ICD-10-CM | POA: Diagnosis not present

## 2018-04-20 DIAGNOSIS — F329 Major depressive disorder, single episode, unspecified: Secondary | ICD-10-CM

## 2018-04-20 DIAGNOSIS — D693 Immune thrombocytopenic purpura: Secondary | ICD-10-CM

## 2018-04-20 DIAGNOSIS — F0390 Unspecified dementia without behavioral disturbance: Secondary | ICD-10-CM | POA: Diagnosis not present

## 2018-04-20 DIAGNOSIS — R531 Weakness: Secondary | ICD-10-CM | POA: Insufficient documentation

## 2018-04-20 LAB — CBC
HEMATOCRIT: 35 % (ref 35.0–47.0)
Hemoglobin: 11.1 g/dL — ABNORMAL LOW (ref 12.0–16.0)
MCH: 26.6 pg (ref 26.0–34.0)
MCHC: 31.6 g/dL — AB (ref 32.0–36.0)
MCV: 84 fL (ref 80.0–100.0)
PLATELETS: 16 10*3/uL — AB (ref 150–400)
RBC: 4.16 MIL/uL (ref 3.80–5.20)
RDW: 18.4 % — AB (ref 11.5–14.5)
WBC: 14.1 10*3/uL — ABNORMAL HIGH (ref 3.6–11.0)

## 2018-04-20 MED ORDER — ROMIPLOSTIM INJECTION 500 MCG
465.0000 ug | Freq: Once | SUBCUTANEOUS | Status: DC
  Filled 2018-04-20: qty 0.93

## 2018-04-20 NOTE — Progress Notes (Signed)
Pt and husband in for follow up. Denies any bleeding difficulties.  Pt does have an open area on right lower leg.  Has a large bandaid on area.

## 2018-04-24 NOTE — Progress Notes (Signed)
[place]  Telephone:(336) [phone]  Fax:(336) [phone]     Ariana [name] DOB: 04-Oct-1933  MR#: [id number]  XFG#:[id number]  Patient Care Team: Idelle Crouch, MD as PCP - General (Internal Medicine)  CHIEF COMPLAINT: Chronic refractory ITP  INTERVAL HISTORY: Patient returns to clinic today for repeat laboratory work, further evaluation, and consideration of additional Nplate.  She has not had any recent falls.  She has no neurologic complaints.  She does not complain of weakness or fatigue today.  She continues to have easy bruising. She denies any fevers. She has no chest pain or shortness of breath. She denies any nausea, vomiting, constipation, or diarrhea.  She has no melena or hematochezia.  She has no urinary complaints.  Patient offers no specific complaints today.  REVIEW OF SYSTEMS:   Review of Systems  Constitutional: Negative.  Negative for fever, malaise/fatigue and weight loss.  HENT: Negative for congestion.   Respiratory: Negative.  Negative for cough, hemoptysis and shortness of breath.   Cardiovascular: Negative.  Negative for chest pain and leg swelling.  Gastrointestinal: Negative.  Negative for abdominal pain, blood in stool, diarrhea, melena and nausea.  Genitourinary: Negative.  Negative for dysuria and hematuria.  Musculoskeletal: Negative.  Negative for back pain.  Skin: Negative.  Negative for rash.  Neurological: Negative.  Negative for sensory change, focal weakness and weakness.  Endo/Heme/Allergies: Bruises/bleeds easily.  Psychiatric/Behavioral: Positive for memory loss. Negative for depression. The patient is not nervous/anxious.     As per HPI. Otherwise, a complete review of systems is negative.  PAST MEDICAL HISTORY: Past Medical History:  Diagnosis Date  . Anemia   . Anxiety   . Asthma   . Chronic back pain   . Depression   . Depression   . GERD (gastroesophageal reflux disease)   . Hiatal hernia   .  Hypercholesteremia   . ITP (idiopathic thrombocytopenic purpura)   . Osteoarthritis   . Osteoporosis   . Pulmonary emboli ([place])   . Restless legs     PAST SURGICAL HISTORY: Past Surgical History:  Procedure Laterality Date  . CARPAL TUNNEL RELEASE    . ESOPHAGOGASTRODUODENOSCOPY (EGD) WITH PROPOFOL N/A 09/10/2015   Procedure: ESOPHAGOGASTRODUODENOSCOPY (EGD) WITH PROPOFOL;  Surgeon: Lollie Sails, MD;  Location: [facility] ENDOSCOPY;  Service: Endoscopy;  Laterality: N/A;  . LUMBAR [place]    . SPLENECTOMY, PARTIAL    . TOTAL ABDOMINAL HYSTERECTOMY      FAMILY HISTORY Family History  Problem Relation Age of Onset  . Stroke Mother     GYNECOLOGIC HISTORY:  No LMP recorded. Patient is postmenopausal.     ADVANCED DIRECTIVES:    HEALTH MAINTENANCE: Social History   Tobacco Use  . Smoking status: Never Smoker  . Smokeless tobacco: Never Used  Substance Use Topics  . Alcohol use: No    Alcohol/week: 0.0 oz  . Drug use: No     Allergies  Allergen Reactions  . Aspirin     Other reaction(s): Distress (finding)  . Diazepam     Other reaction(s): Itching of Skin sneezing  . Morphine     Other reaction(s): Itching of Skin  . Other     Other reaction(s): Unknown seasonal allergies  . Tetanus Toxoids     Other reaction(s): Localized superficial swelling of skin    Current Outpatient Medications  Medication Sig Dispense Refill  . acetaminophen (TYLENOL) 325 MG tablet Take 2 tablets (650 mg total) by mouth every 6 (six) hours as  needed for mild pain (or Fever >/= 101). 100 tablet 0  . albuterol (PROVENTIL HFA;VENTOLIN HFA) 108 (90 BASE) MCG/ACT inhaler Inhale 2 puffs into the lungs every 6 (six) hours as needed for wheezing or shortness of breath.    . ALPRAZolam (XANAX) 0.5 MG tablet Take 0.5 mg by mouth 3 (three) times daily as needed for anxiety.     Marland Kitchen buPROPion (WELLBUTRIN XL) 150 MG 24 hr tablet Take 150 mg by mouth daily.    . Calcium Carbonate-Vitamin D  (CALCIUM 600+D) 600-400 MG-UNIT per tablet Take 1 tablet by mouth daily.    . citalopram (CELEXA) 40 MG tablet Take 40 mg by mouth daily.    . Cyanocobalamin (RA VITAMIN B-12 TR) 1000 MCG TBCR Take 1,000 mcg by mouth daily.     Marland Kitchen donepezil (ARICEPT) 5 MG tablet Take 10 mg by mouth at bedtime.     . fluticasone (FLONASE) 50 MCG/ACT nasal spray Place into the nose.    Marland Kitchen Fluticasone-Salmeterol (ADVAIR) 250-50 MCG/DOSE AEPB Inhale 1 puff into the lungs daily as needed.     . gabapentin (NEURONTIN) 300 MG capsule Take 300 mg by mouth 4 (four) times daily.    Marland Kitchen HYDROcodone-acetaminophen (NORCO) 7.5-325 MG tablet 1 tablet by mouth q 6 h prn    . levothyroxine (SYNTHROID, LEVOTHROID) 50 MCG tablet Take 50 mcg by mouth daily before breakfast. Take 30 to 60 minutes before breakfast.    . Multiple Vitamins-Minerals (HAIR SKIN AND NAILS FORMULA PO) Take 1 tablet by mouth every evening.    . Multiple Vitamins-Minerals (OCUVITE-LUTEIN PO) Take 1 capsule by mouth daily.     Marland Kitchen oxybutynin (DITROPAN) 5 MG tablet Take 5 mg by mouth 3 (three) times daily.    . pantoprazole (PROTONIX) 20 MG tablet Take 20 mg by mouth 2 (two) times daily.     . pravastatin (PRAVACHOL) 20 MG tablet Take 20 mg by mouth daily.    . predniSONE (DELTASONE) 10 MG tablet   1  . traMADol (ULTRAM) 50 MG tablet Take 50 mg by mouth 3 (three) times daily as needed for moderate pain or severe pain. pain    . vitamin C (ASCORBIC ACID) 500 MG tablet Take 500 mg by mouth daily.    . vitamin E (E-400) 400 UNIT capsule Take 400 Units by mouth daily.    . memantine (NAMENDA) 5 MG tablet Take 5 mg by mouth 2 (two) times daily.     . montelukast (SINGULAIR) 10 MG tablet Take by mouth.    . prochlorperazine (COMPAZINE) 10 MG tablet Take 1 tablet (10 mg total) by mouth every 6 (six) hours as needed for nausea or vomiting. (Patient not taking: Reported on 04/20/2018) 30 tablet 2   No current facility-administered medications for this visit.     Facility-Administered Medications Ordered in Other Visits  Medication Dose Route Frequency Provider Last Rate Last Dose  . romiPLOStim (NPLATE) injection 465 mcg  465 mcg Subcutaneous Once Lloyd Huger, MD        OBJECTIVE: BP 111/69 (BP Location: Left Arm, Patient Position: Sitting)   Pulse 64   Temp (!) 97 F (36.1 C) (Tympanic)   Resp 18   Wt 140 lb 6 oz (63.7 kg)   BMI 22.66 kg/m    Body mass index is 22.66 kg/m.    ECOG FS:1 - Symptomatic but completely ambulatory  General: Well-developed, well-nourished, no acute distress. Eyes: Pink conjunctiva, anicteric sclera. Lungs: Clear to auscultation bilaterally. Heart: Regular rate  and rhythm. No rubs, murmurs, or gallops. Abdomen: Soft, nontender, nondistended. No organomegaly noted, normoactive bowel sounds. Musculoskeletal: No edema, cyanosis, or clubbing. Neuro: Alert, answering all questions appropriately. Cranial nerves grossly intact. Skin: No rashes or petechiae noted. Psych: Normal affect.  LAB RESULTS:  Appointment on 04/20/2018  Component Date Value Ref Range Status  . WBC 04/20/2018 14.1* 3.6 - 11.0 K/uL Final  . RBC 04/20/2018 4.16  3.80 - 5.20 MIL/uL Final  . Hemoglobin 04/20/2018 11.1* 12.0 - 16.0 g/dL Final  . HCT 04/20/2018 35.0  35.0 - 47.0 % Final  . MCV 04/20/2018 84.0  80.0 - 100.0 fL Final  . MCH 04/20/2018 26.6  26.0 - 34.0 pg Final  . MCHC 04/20/2018 31.6* 32.0 - 36.0 g/dL Final  . RDW 04/20/2018 18.4* 11.5 - 14.5 % Final  . Platelets 04/20/2018 16* 150 - 400 K/uL Final   Comment: RESULT REPEATED AND VERIFIED PLATELET COUNT CONFIRMED BY SMEAR CRITICAL RESULT CALLED TO, READ BACK BY AND VERIFIED WITH: HEATHER [name] @ 1:48PM 04/20/2018 BY [facility] LARGE BODY PLATELETS NOTED ON SMEAR Performed at [facility], [address] [place], [place] [address]     STUDIES: No results found.  ASSESSMENT:  Chronic refractory ITP  PLAN:   1. Chronic refractory ITP: Patient had a poor response  to Prednisone, WinRho, and Rituxan. She is status post splenectomy in 2007.  Platelet count remains less than 100 at 16 today.  Therefore will proceed with 7 mcg/kg Nplate. Return to clinic in 4 weeks for laboratory work, further evaluation, and consideration of Nplate if her platelet count continues to be less than 100. Patient appears to only requiring injections every 4 weeks.  If platelet count continues to remain decreased at her 4-week evaluation, will consider increasing dose of Nplate. Can consider Promacta 12.5mg  at a later date if necessary. Max dose of Nplate is 10 mcg/kg. 2. Weakness and fatigue: Chronic and unchanged.  Although she feels improved after receiving her injection. 3. Depression/dementia: Memory seems to be worse.  Continue follow-up with primary care. 4.  Right hand injury: Resolved.  I spent a total of 30 minutes face-to-face with the patient of which greater than 50% of the visit was spent in counseling and coordination of care as detailed above.  Patient expressed understanding and was in agreement with this plan. She also understands that She can call clinic at any time with any questions, concerns, or complaints.    Lloyd Huger, MD   04/24/2018 6:43 AM

## 2018-04-25 ENCOUNTER — Telehealth: Payer: Self-pay | Admitting: *Deleted

## 2018-04-25 DIAGNOSIS — D473 Essential (hemorrhagic) thrombocythemia: Secondary | ICD-10-CM | POA: Diagnosis not present

## 2018-04-25 NOTE — Telephone Encounter (Signed)
Patient aware of results, patient to come in for lab only visit Thursday 7/18.

## 2018-04-25 NOTE — Telephone Encounter (Signed)
MD Office called to report a platelet count of 11.

## 2018-04-25 NOTE — Telephone Encounter (Signed)
She just received Nplate on Thursday.

## 2018-04-28 ENCOUNTER — Other Ambulatory Visit: Payer: Self-pay

## 2018-04-28 ENCOUNTER — Inpatient Hospital Stay: Payer: Medicare HMO

## 2018-04-28 DIAGNOSIS — R5382 Chronic fatigue, unspecified: Secondary | ICD-10-CM | POA: Diagnosis not present

## 2018-04-28 DIAGNOSIS — F329 Major depressive disorder, single episode, unspecified: Secondary | ICD-10-CM | POA: Diagnosis not present

## 2018-04-28 DIAGNOSIS — D693 Immune thrombocytopenic purpura: Secondary | ICD-10-CM

## 2018-04-28 DIAGNOSIS — R531 Weakness: Secondary | ICD-10-CM | POA: Diagnosis not present

## 2018-04-28 DIAGNOSIS — F0390 Unspecified dementia without behavioral disturbance: Secondary | ICD-10-CM | POA: Diagnosis not present

## 2018-04-28 LAB — CBC
HCT: 34.5 % — ABNORMAL LOW (ref 35.0–47.0)
Hemoglobin: 10.8 g/dL — ABNORMAL LOW (ref 12.0–16.0)
MCH: 26.3 pg (ref 26.0–34.0)
MCHC: 31.3 g/dL — ABNORMAL LOW (ref 32.0–36.0)
MCV: 84 fL (ref 80.0–100.0)
PLATELETS: 39 10*3/uL — AB (ref 150–400)
RBC: 4.11 MIL/uL (ref 3.80–5.20)
RDW: 19.2 % — ABNORMAL HIGH (ref 11.5–14.5)
WBC: 16.7 10*3/uL — ABNORMAL HIGH (ref 3.6–11.0)

## 2018-05-02 DIAGNOSIS — M5416 Radiculopathy, lumbar region: Secondary | ICD-10-CM | POA: Diagnosis not present

## 2018-05-02 DIAGNOSIS — M5136 Other intervertebral disc degeneration, lumbar region: Secondary | ICD-10-CM | POA: Diagnosis not present

## 2018-05-02 DIAGNOSIS — D696 Thrombocytopenia, unspecified: Secondary | ICD-10-CM | POA: Diagnosis not present

## 2018-05-02 DIAGNOSIS — M62830 Muscle spasm of back: Secondary | ICD-10-CM | POA: Diagnosis not present

## 2018-05-23 ENCOUNTER — Inpatient Hospital Stay (HOSPITAL_BASED_OUTPATIENT_CLINIC_OR_DEPARTMENT_OTHER): Payer: Medicare HMO | Admitting: Oncology

## 2018-05-23 ENCOUNTER — Encounter: Payer: Self-pay | Admitting: Oncology

## 2018-05-23 ENCOUNTER — Inpatient Hospital Stay: Payer: Medicare HMO

## 2018-05-23 ENCOUNTER — Inpatient Hospital Stay: Payer: Medicare HMO | Attending: Oncology

## 2018-05-23 VITALS — BP 155/74 | HR 62 | Temp 97.8°F | Resp 20 | Wt 143.8 lb

## 2018-05-23 DIAGNOSIS — D693 Immune thrombocytopenic purpura: Secondary | ICD-10-CM | POA: Diagnosis not present

## 2018-05-23 DIAGNOSIS — R531 Weakness: Secondary | ICD-10-CM

## 2018-05-23 DIAGNOSIS — T148XXA Other injury of unspecified body region, initial encounter: Secondary | ICD-10-CM

## 2018-05-23 DIAGNOSIS — M7981 Nontraumatic hematoma of soft tissue: Secondary | ICD-10-CM

## 2018-05-23 DIAGNOSIS — F0390 Unspecified dementia without behavioral disturbance: Secondary | ICD-10-CM | POA: Diagnosis not present

## 2018-05-23 DIAGNOSIS — F329 Major depressive disorder, single episode, unspecified: Secondary | ICD-10-CM

## 2018-05-23 DIAGNOSIS — R5382 Chronic fatigue, unspecified: Secondary | ICD-10-CM

## 2018-05-23 LAB — CBC
HCT: 32.8 % — ABNORMAL LOW (ref 35.0–47.0)
Hemoglobin: 10.4 g/dL — ABNORMAL LOW (ref 12.0–16.0)
MCH: 26.8 pg (ref 26.0–34.0)
MCHC: 31.6 g/dL — ABNORMAL LOW (ref 32.0–36.0)
MCV: 84.7 fL (ref 80.0–100.0)
Platelets: 13 10*3/uL — CL (ref 150–400)
RBC: 3.87 MIL/uL (ref 3.80–5.20)
RDW: 19.7 % — AB (ref 11.5–14.5)
WBC: 13.8 10*3/uL — AB (ref 3.6–11.0)

## 2018-05-23 MED ORDER — ROMIPLOSTIM INJECTION 500 MCG
500.0000 ug | Freq: Once | SUBCUTANEOUS | Status: AC
  Administered 2018-05-23: 500 ug via SUBCUTANEOUS
  Filled 2018-05-23: qty 1

## 2018-05-23 NOTE — Progress Notes (Signed)
Patient denies any concerns today.  

## 2018-05-25 NOTE — Progress Notes (Signed)
[place]  Telephone:(336) [phone]  Fax:(336) [phone]     Ariana [name] DOB: 12-Mar-1933  MR#: [id number]  OFB#:[id number]  Patient Care Team: Idelle Crouch, MD as PCP - General (Internal Medicine)  CHIEF COMPLAINT: Chronic refractory ITP  INTERVAL HISTORY: Patient returns to clinic today for repeat laboratory work and further evaluation.  She has noted some increased bruising over the past several weeks including under her eyes and in the pinna of her ear.  She otherwise feels well.  She has chronic weakness and fatigue.  She has not had any recent falls.  She has no neurologic complaints. She denies any fevers. She has no chest pain or shortness of breath. She denies any nausea, vomiting, constipation, or diarrhea.  She has no melena or hematochezia.  She has no urinary complaints.  Patient offers no further specific complaints today.  REVIEW OF SYSTEMS:   Review of Systems  Constitutional: Negative.  Negative for fever, malaise/fatigue and weight loss.  HENT: Negative for congestion.   Respiratory: Negative.  Negative for cough, hemoptysis and shortness of breath.   Cardiovascular: Negative.  Negative for chest pain and leg swelling.  Gastrointestinal: Negative.  Negative for abdominal pain, blood in stool, diarrhea, melena and nausea.  Genitourinary: Negative.  Negative for dysuria and hematuria.  Musculoskeletal: Negative.  Negative for back pain.  Skin: Negative.  Negative for rash.  Neurological: Negative.  Negative for sensory change, focal weakness and weakness.  Endo/Heme/Allergies: Bruises/bleeds easily.  Psychiatric/Behavioral: Positive for memory loss. Negative for depression. The patient is not nervous/anxious.     As per HPI. Otherwise, a complete review of systems is negative.  PAST MEDICAL HISTORY: Past Medical History:  Diagnosis Date  . Anemia   . Anxiety   . Asthma   . Chronic back pain   . Depression   . Depression   . GERD  (gastroesophageal reflux disease)   . Hiatal hernia   . Hypercholesteremia   . ITP (idiopathic thrombocytopenic purpura)   . Osteoarthritis   . Osteoporosis   . Pulmonary emboli ([place])   . Restless legs     PAST SURGICAL HISTORY: Past Surgical History:  Procedure Laterality Date  . CARPAL TUNNEL RELEASE    . ESOPHAGOGASTRODUODENOSCOPY (EGD) WITH PROPOFOL N/A 09/10/2015   Procedure: ESOPHAGOGASTRODUODENOSCOPY (EGD) WITH PROPOFOL;  Surgeon: Lollie Sails, MD;  Location: [facility] ENDOSCOPY;  Service: Endoscopy;  Laterality: N/A;  . LUMBAR [place]    . SPLENECTOMY, PARTIAL    . TOTAL ABDOMINAL HYSTERECTOMY      FAMILY HISTORY Family History  Problem Relation Age of Onset  . Stroke Mother     GYNECOLOGIC HISTORY:  No LMP recorded. Patient is postmenopausal.     ADVANCED DIRECTIVES:    HEALTH MAINTENANCE: Social History   Tobacco Use  . Smoking status: Never Smoker  . Smokeless tobacco: Never Used  Substance Use Topics  . Alcohol use: No    Alcohol/week: 0.0 standard drinks  . Drug use: No     Allergies  Allergen Reactions  . Aspirin     Other reaction(s): Distress (finding)  . Diazepam     Other reaction(s): Itching of Skin sneezing  . Morphine     Other reaction(s): Itching of Skin  . Other     Other reaction(s): Unknown seasonal allergies  . Tetanus Toxoids     Other reaction(s): Localized superficial swelling of skin    Current Outpatient Medications  Medication Sig Dispense Refill  . acetaminophen (TYLENOL)  325 MG tablet Take 2 tablets (650 mg total) by mouth every 6 (six) hours as needed for mild pain (or Fever >/= 101). 100 tablet 0  . albuterol (PROVENTIL HFA;VENTOLIN HFA) 108 (90 BASE) MCG/ACT inhaler Inhale 2 puffs into the lungs every 6 (six) hours as needed for wheezing or shortness of breath.    . ALPRAZolam (XANAX) 0.5 MG tablet Take 0.5 mg by mouth 3 (three) times daily as needed for anxiety.     Marland Kitchen buPROPion (WELLBUTRIN XL) 150 MG 24 hr  tablet Take 150 mg by mouth daily.    . Calcium Carbonate-Vitamin D (CALCIUM 600+D) 600-400 MG-UNIT per tablet Take 1 tablet by mouth daily.    . citalopram (CELEXA) 40 MG tablet Take 40 mg by mouth daily.    . Cyanocobalamin (RA VITAMIN B-12 TR) 1000 MCG TBCR Take 1,000 mcg by mouth daily.     Marland Kitchen donepezil (ARICEPT) 5 MG tablet Take 10 mg by mouth at bedtime.     . fluticasone (FLONASE) 50 MCG/ACT nasal spray Place into the nose.    Marland Kitchen Fluticasone-Salmeterol (ADVAIR) 250-50 MCG/DOSE AEPB Inhale 1 puff into the lungs daily as needed.     . gabapentin (NEURONTIN) 300 MG capsule Take 300 mg by mouth 4 (four) times daily.    Marland Kitchen HYDROcodone-acetaminophen (NORCO) 7.5-325 MG tablet 1 tablet by mouth q 6 h prn    . levothyroxine (SYNTHROID, LEVOTHROID) 50 MCG tablet Take 50 mcg by mouth daily before breakfast. Take 30 to 60 minutes before breakfast.    . Multiple Vitamins-Minerals (HAIR SKIN AND NAILS FORMULA PO) Take 1 tablet by mouth every evening.    . Multiple Vitamins-Minerals (OCUVITE-LUTEIN PO) Take 1 capsule by mouth daily.     Marland Kitchen oxybutynin (DITROPAN) 5 MG tablet Take 5 mg by mouth 3 (three) times daily.    . pantoprazole (PROTONIX) 20 MG tablet Take 20 mg by mouth 2 (two) times daily.     . pravastatin (PRAVACHOL) 20 MG tablet Take 20 mg by mouth daily.    . predniSONE (DELTASONE) 10 MG tablet   1  . prochlorperazine (COMPAZINE) 10 MG tablet Take 1 tablet (10 mg total) by mouth every 6 (six) hours as needed for nausea or vomiting. 30 tablet 2  . traMADol (ULTRAM) 50 MG tablet Take 50 mg by mouth 3 (three) times daily as needed for moderate pain or severe pain. pain    . vitamin C (ASCORBIC ACID) 500 MG tablet Take 500 mg by mouth daily.    . vitamin E (E-400) 400 UNIT capsule Take 400 Units by mouth daily.    . memantine (NAMENDA) 5 MG tablet Take 5 mg by mouth 2 (two) times daily.     . montelukast (SINGULAIR) 10 MG tablet Take by mouth.     No current facility-administered medications for  this visit.     OBJECTIVE: BP (!) 155/74   Pulse 62   Temp 97.8 F (36.6 C) (Tympanic)   Resp 20   Wt 143 lb 12.8 oz (65.2 kg)   BMI 23.21 kg/m    Body mass index is 23.21 kg/m.    ECOG FS:1 - Symptomatic but completely ambulatory  General: Well-developed, well-nourished, no acute distress. Eyes: Pink conjunctiva, anicteric sclera. HEENT: Normocephalic, moist mucous membranes.  Bruising noted under bilateral eyes as well as left pinna of ear. Lungs: Clear to auscultation bilaterally. Heart: Regular rate and rhythm. No rubs, murmurs, or gallops. Abdomen: Soft, nontender, nondistended. No organomegaly noted, normoactive bowel sounds.  Musculoskeletal: No edema, cyanosis, or clubbing. Neuro: Alert, answering all questions appropriately. Cranial nerves grossly intact. Skin: No rashes or petechiae noted. Psych: Normal affect.  LAB RESULTS:  Appointment on 05/23/2018  Component Date Value Ref Range Status  . WBC 05/23/2018 13.8* 3.6 - 11.0 K/uL Final  . RBC 05/23/2018 3.87  3.80 - 5.20 MIL/uL Final  . Hemoglobin 05/23/2018 10.4* 12.0 - 16.0 g/dL Final  . HCT 05/23/2018 32.8* 35.0 - 47.0 % Final  . MCV 05/23/2018 84.7  80.0 - 100.0 fL Final  . MCH 05/23/2018 26.8  26.0 - 34.0 pg Final  . MCHC 05/23/2018 31.6* 32.0 - 36.0 g/dL Final  . RDW 05/23/2018 19.7* 11.5 - 14.5 % Final  . Platelets 05/23/2018 13* 150 - 400 K/uL Final   Comment: RESULT REPEATED AND VERIFIED PLATELET COUNT CONFIRMED BY SMEAR CRITICAL RESULT CALLED TO, READ BACK BY AND VERIFIED WITH: [place] @ 9675 BY K[facility] 05/23/2018 Performed at [facility], [address] [place], [place] [address]     STUDIES: No results found.  ASSESSMENT:  Chronic refractory ITP  PLAN:   1. Chronic refractory ITP: Patient had a poor response to Prednisone, WinRho, and Rituxan. She is status post splenectomy in 2007.  Patient's platelet count continues to be decreased at 13 today.  Given her increased bruising we will  increase her dose of Nplate to 8 mcg/kg.  Return to clinic in 2 weeks for laboratory work only and then in 4 weeks for further evaluation and consideration of additional Nplate.  Can consider Promacta 12.5mg  or Tavalisse 100 mg BID at a later date if necessary. Max dose of Nplate is 10 mcg/kg. 2. Weakness and fatigue: Chronic and unchanged.  Although she feels improved after receiving her injection. 3. Depression/dementia: Memory seems to be worse.  Continue follow-up with primary care. 4.  Increased bruising: Increased dose of Nplate as above.  I spent a total of 30 minutes face-to-face with the patient of which greater than 50% of the visit was spent in counseling and coordination of care as detailed above.   Patient expressed understanding and was in agreement with this plan. She also understands that She can call clinic at any time with any questions, concerns, or complaints.    Lloyd Huger, MD   05/25/2018 2:28 PM

## 2018-06-06 ENCOUNTER — Inpatient Hospital Stay: Payer: Medicare HMO

## 2018-06-06 DIAGNOSIS — D693 Immune thrombocytopenic purpura: Secondary | ICD-10-CM

## 2018-06-06 LAB — CBC
HEMATOCRIT: 33.5 % — AB (ref 35.0–47.0)
Hemoglobin: 10.2 g/dL — ABNORMAL LOW (ref 12.0–16.0)
MCH: 25.6 pg — ABNORMAL LOW (ref 26.0–34.0)
MCHC: 30.5 g/dL — AB (ref 32.0–36.0)
MCV: 84.1 fL (ref 80.0–100.0)
PLATELETS: 229 10*3/uL (ref 150–400)
RBC: 3.99 MIL/uL (ref 3.80–5.20)
RDW: 20.2 % — AB (ref 11.5–14.5)
WBC: 12.9 10*3/uL — AB (ref 4.0–10.5)

## 2018-06-17 DIAGNOSIS — I10 Essential (primary) hypertension: Secondary | ICD-10-CM | POA: Diagnosis not present

## 2018-06-17 DIAGNOSIS — F0280 Dementia in other diseases classified elsewhere without behavioral disturbance: Secondary | ICD-10-CM | POA: Diagnosis not present

## 2018-06-17 DIAGNOSIS — D473 Essential (hemorrhagic) thrombocythemia: Secondary | ICD-10-CM | POA: Diagnosis not present

## 2018-06-17 DIAGNOSIS — E7800 Pure hypercholesterolemia, unspecified: Secondary | ICD-10-CM | POA: Diagnosis not present

## 2018-06-17 DIAGNOSIS — Z79899 Other long term (current) drug therapy: Secondary | ICD-10-CM | POA: Diagnosis not present

## 2018-06-17 DIAGNOSIS — G301 Alzheimer's disease with late onset: Secondary | ICD-10-CM | POA: Diagnosis not present

## 2018-06-17 DIAGNOSIS — E039 Hypothyroidism, unspecified: Secondary | ICD-10-CM | POA: Diagnosis not present

## 2018-06-19 NOTE — Progress Notes (Signed)
[place]  [phone]  [phone]     Ariana [name] DOB: 1932/10/24  MR#: [id number]  HKV#:[id number]  Patient Care Team: Idelle Crouch, MD as PCP - General (Internal Medicine)  CHIEF COMPLAINT: Chronic refractory ITP  INTERVAL HISTORY: Patient returns to clinic today for further evaluation and continuation of Nplate.  She currently feels well. She admits to increasing fatigue over the past several weeks, but thinks this may be because her son recently had surgery and is currently in the ICU.  She does not report any increased bruising or bleeding over her baseline.  She has not had any recent falls.  She has no neurologic complaints. She denies any fevers. She has no chest pain or shortness of breath. She denies any nausea, vomiting, constipation, or diarrhea.  She has no melena or hematochezia.  She has no urinary complaints.  Patient offers no further specific complaints today.  REVIEW OF SYSTEMS:   Review of Systems  Constitutional: Negative.  Negative for fever, malaise/fatigue and weight loss.  HENT: Negative for congestion.   Respiratory: Negative.  Negative for cough, hemoptysis and shortness of breath.   Cardiovascular: Negative.  Negative for chest pain and leg swelling.  Gastrointestinal: Negative.  Negative for abdominal pain, blood in stool, diarrhea, melena and nausea.  Genitourinary: Negative.  Negative for dysuria and hematuria.  Musculoskeletal: Negative.  Negative for back pain.  Skin: Negative.  Negative for rash.  Neurological: Negative.  Negative for sensory change, focal weakness and weakness.  Endo/Heme/Allergies: Bruises/bleeds easily.  Psychiatric/Behavioral: Positive for memory loss. Negative for depression. The patient is not nervous/anxious.     As per HPI. Otherwise, a complete review of systems is negative.  PAST MEDICAL HISTORY: Past Medical History:  Diagnosis Date  . Anemia   . Anxiety   .  Asthma   . Chronic back pain   . Depression   . Depression   . GERD (gastroesophageal reflux disease)   . Hiatal hernia   . Hypercholesteremia   . ITP (idiopathic thrombocytopenic purpura)   . Osteoarthritis   . Osteoporosis   . Pulmonary emboli ([place])   . Restless legs     PAST SURGICAL HISTORY: Past Surgical History:  Procedure Laterality Date  . CARPAL TUNNEL RELEASE    . ESOPHAGOGASTRODUODENOSCOPY (EGD) WITH PROPOFOL N/A 09/10/2015   Procedure: ESOPHAGOGASTRODUODENOSCOPY (EGD) WITH PROPOFOL;  Surgeon: Lollie Sails, MD;  [facility]: [facility] ENDOSCOPY;  Service: Endoscopy;  Laterality: N/A;  . LUMBAR [place]    . SPLENECTOMY, PARTIAL    . TOTAL ABDOMINAL HYSTERECTOMY      FAMILY HISTORY Family History  Problem Relation Age of Onset  . Stroke Mother     GYNECOLOGIC HISTORY:  No LMP recorded. Patient is postmenopausal.     ADVANCED DIRECTIVES:    HEALTH MAINTENANCE: Social History   Tobacco Use  . Smoking status: Never Smoker  . Smokeless tobacco: Never Used  Substance Use Topics  . Alcohol use: No    Alcohol/week: 0.0 standard drinks  . Drug use: No     Allergies  Allergen Reactions  . Aspirin     Other reaction(s): Distress (finding)  . Diazepam     Other reaction(s): Itching of Skin sneezing  . Morphine     Other reaction(s): Itching of Skin  . Other     Other reaction(s): Unknown seasonal allergies  . Tetanus Toxoids     Other reaction(s): Localized superficial swelling of skin    Current Outpatient  Medications  Medication Sig Dispense Refill  . acetaminophen (TYLENOL) 325 MG tablet Take 2 tablets (650 mg total) by mouth every 6 (six) hours as needed for mild pain (or Fever >/= 101). 100 tablet 0  . albuterol (PROVENTIL HFA;VENTOLIN HFA) 108 (90 BASE) MCG/ACT inhaler Inhale 2 puffs into the lungs every 6 (six) hours as needed for wheezing or shortness of breath.    . ALPRAZolam (XANAX) 0.5 MG tablet Take 0.5 mg by mouth 3 (three) times  daily as needed for anxiety.     Marland Kitchen buPROPion (WELLBUTRIN XL) 150 MG 24 hr tablet Take 150 mg by mouth daily.    . Calcium Carbonate-Vitamin D (CALCIUM 600+D) 600-400 MG-UNIT per tablet Take 1 tablet by mouth daily.    . citalopram (CELEXA) 40 MG tablet Take 40 mg by mouth daily.    . Cyanocobalamin (RA VITAMIN B-12 TR) 1000 MCG TBCR Take 1,000 mcg by mouth daily.     Marland Kitchen donepezil (ARICEPT) 5 MG tablet Take 10 mg by mouth at bedtime.     . fluticasone (FLONASE) 50 MCG/ACT nasal spray Place into the nose.    Marland Kitchen Fluticasone-Salmeterol (ADVAIR) 250-50 MCG/DOSE AEPB Inhale 1 puff into the lungs daily as needed.     . gabapentin (NEURONTIN) 300 MG capsule Take 300 mg by mouth 4 (four) times daily.    Marland Kitchen HYDROcodone-acetaminophen (NORCO) 7.5-325 MG tablet 1 tablet by mouth q 6 h prn    . levothyroxine (SYNTHROID, LEVOTHROID) 50 MCG tablet Take 50 mcg by mouth daily before breakfast. Take 30 to 60 minutes before breakfast.    . Multiple Vitamins-Minerals (HAIR SKIN AND NAILS FORMULA PO) Take 1 tablet by mouth every evening.    . Multiple Vitamins-Minerals (OCUVITE-LUTEIN PO) Take 1 capsule by mouth daily.     Marland Kitchen oxybutynin (DITROPAN) 5 MG tablet Take 5 mg by mouth 3 (three) times daily.    . pantoprazole (PROTONIX) 20 MG tablet Take 20 mg by mouth 2 (two) times daily.     . pravastatin (PRAVACHOL) 20 MG tablet Take 20 mg by mouth daily.    . predniSONE (DELTASONE) 10 MG tablet   1  . traMADol (ULTRAM) 50 MG tablet Take 50 mg by mouth 3 (three) times daily as needed for moderate pain or severe pain. pain    . vitamin C (ASCORBIC ACID) 500 MG tablet Take 500 mg by mouth daily.    . vitamin E (E-400) 400 UNIT capsule Take 400 Units by mouth daily.    . memantine (NAMENDA) 5 MG tablet Take 5 mg by mouth 2 (two) times daily.     . montelukast (SINGULAIR) 10 MG tablet Take by mouth.    . prochlorperazine (COMPAZINE) 10 MG tablet Take 1 tablet (10 mg total) by mouth every 6 (six) hours as needed for nausea or  vomiting. (Patient not taking: Reported on 06/20/2018) 30 tablet 2   No current facility-administered medications for this visit.     OBJECTIVE: BP 132/82 (BP Location: Left Arm, Patient Position: Sitting)   Pulse (!) 59   Temp (!) 97.2 F (36.2 C)   Resp 18   Wt 148 lb 4 oz (67.2 kg)   BMI 23.93 kg/m    Body mass index is 23.93 kg/m.    ECOG FS:1 - Symptomatic but completely ambulatory  General: Well-developed, well-nourished, no acute distress. Eyes: Pink conjunctiva, anicteric sclera. HEENT: Normocephalic, moist mucous membranes. Lungs: Clear to auscultation bilaterally. Heart: Regular rate and rhythm. No rubs, murmurs, or gallops.  Abdomen: Soft, nontender, nondistended. No organomegaly noted, normoactive bowel sounds. Musculoskeletal: No edema, cyanosis, or clubbing. Neuro: Alert, answering all questions appropriately. Cranial nerves grossly intact. Skin: No rashes or petechiae noted. Psych: Normal affect.   LAB RESULTS:  Appointment on 06/20/2018  Component Date Value Ref Range Status  . WBC 06/20/2018 10.7* 4.0 - 10.5 K/uL Final   WHITE COUNT CONFIRMED ON SMEAR  . RBC 06/20/2018 4.24  3.80 - 5.20 MIL/uL Final  . Hemoglobin 06/20/2018 11.2* 12.0 - 16.0 g/dL Final  . HCT 06/20/2018 35.5  35.0 - 47.0 % Final  . MCV 06/20/2018 83.6  80.0 - 100.0 fL Final  . MCH 06/20/2018 26.4  26.0 - 34.0 pg Final  . MCHC 06/20/2018 31.5* 32.0 - 36.0 g/dL Final  . RDW 06/20/2018 19.4* 11.5 - 14.5 % Final  . Platelets 06/20/2018 35* 150 - 400 K/uL Final   Comment: PLATELET COUNT CONFIRMED BY SMEAR GIANT PLATELETS SEEN REPEATED TO VERIFY Performed at [facility], [address] [place], [place] [address]     STUDIES: No results found.  ASSESSMENT:  Chronic refractory ITP  PLAN:   1. Chronic refractory ITP: Patient had a poor response to Prednisone, WinRho, and Rituxan. She is status post splenectomy in 2007.  Patient's platelet count is decreased at 35 today, therefore  we will proceed with 8 mcg/kg Nplate.  Patient had laboratory work 2 weeks after her last injection and her platelet count was noted to increase to greater than 200.  Return to clinic in 4 weeks for further evaluation and continuation of treatment.  Can consider Promacta 12.5mg  or Tavalisse 100 mg BID at a later date if necessary. Max dose of Nplate is 10 mcg/kg. 2. Weakness and fatigue: Chronic and unchanged.  Although she feels improved after receiving her injection. 3. Depression/dementia: Memory seems to be worse.  Continue follow-up with primary care. 4.  Increased bruising: Improved.  Increased dose of Nplate as above.  I spent a total of 30 minutes face-to-face with the patient of which greater than 50% of the visit was spent in counseling and coordination of care as detailed above.    Patient expressed understanding and was in agreement with this plan. She also understands that She can call clinic at any time with any questions, concerns, or complaints.    Lloyd Huger, MD   06/20/2018 12:36 PM

## 2018-06-20 ENCOUNTER — Inpatient Hospital Stay: Payer: Medicare HMO | Admitting: Oncology

## 2018-06-20 ENCOUNTER — Inpatient Hospital Stay: Payer: Medicare HMO | Attending: Oncology

## 2018-06-20 ENCOUNTER — Inpatient Hospital Stay: Payer: Medicare HMO

## 2018-06-20 ENCOUNTER — Encounter: Payer: Self-pay | Admitting: Oncology

## 2018-06-20 VITALS — BP 132/82 | HR 59 | Temp 97.2°F | Resp 18 | Wt 148.2 lb

## 2018-06-20 DIAGNOSIS — F0390 Unspecified dementia without behavioral disturbance: Secondary | ICD-10-CM | POA: Diagnosis not present

## 2018-06-20 DIAGNOSIS — D693 Immune thrombocytopenic purpura: Secondary | ICD-10-CM

## 2018-06-20 DIAGNOSIS — R5383 Other fatigue: Secondary | ICD-10-CM

## 2018-06-20 DIAGNOSIS — M7981 Nontraumatic hematoma of soft tissue: Secondary | ICD-10-CM

## 2018-06-20 DIAGNOSIS — F419 Anxiety disorder, unspecified: Secondary | ICD-10-CM | POA: Diagnosis not present

## 2018-06-20 DIAGNOSIS — R531 Weakness: Secondary | ICD-10-CM

## 2018-06-20 LAB — CBC
HCT: 35.5 % (ref 35.0–47.0)
Hemoglobin: 11.2 g/dL — ABNORMAL LOW (ref 12.0–16.0)
MCH: 26.4 pg (ref 26.0–34.0)
MCHC: 31.5 g/dL — AB (ref 32.0–36.0)
MCV: 83.6 fL (ref 80.0–100.0)
PLATELETS: 35 10*3/uL — AB (ref 150–400)
RBC: 4.24 MIL/uL (ref 3.80–5.20)
RDW: 19.4 % — AB (ref 11.5–14.5)
WBC: 10.7 10*3/uL — ABNORMAL HIGH (ref 4.0–10.5)

## 2018-06-20 MED ORDER — ROMIPLOSTIM INJECTION 500 MCG
8.0000 ug/kg | Freq: Once | SUBCUTANEOUS | Status: AC
  Administered 2018-06-20: 540 ug via SUBCUTANEOUS
  Filled 2018-06-20: qty 1.08

## 2018-06-20 NOTE — Progress Notes (Signed)
Pt in for follow up, reports "I'm just real tired all the time."

## 2018-07-11 ENCOUNTER — Emergency Department: Payer: Medicare HMO

## 2018-07-11 ENCOUNTER — Emergency Department
Admission: EM | Admit: 2018-07-11 | Discharge: 2018-07-11 | Disposition: A | Discharge status: Home / Self Care | Payer: Medicare HMO | Source: Home / Self Care | Attending: Student in an Organized Health Care Education/Training Program | Admitting: Student in an Organized Health Care Education/Training Program

## 2018-07-11 ENCOUNTER — Encounter: Payer: Self-pay | Admitting: Emergency Medicine

## 2018-07-11 DIAGNOSIS — Z79899 Other long term (current) drug therapy: Secondary | ICD-10-CM

## 2018-07-11 DIAGNOSIS — T148XXA Other injury of unspecified body region, initial encounter: Secondary | ICD-10-CM | POA: Diagnosis not present

## 2018-07-11 DIAGNOSIS — S8011XA Contusion of right lower leg, initial encounter: Secondary | ICD-10-CM

## 2018-07-11 DIAGNOSIS — R402144 Coma scale, eyes open, spontaneous, 24 hours or more after hospital admission: Secondary | ICD-10-CM | POA: Diagnosis not present

## 2018-07-11 DIAGNOSIS — D638 Anemia in other chronic diseases classified elsewhere: Secondary | ICD-10-CM | POA: Diagnosis present

## 2018-07-11 DIAGNOSIS — L03115 Cellulitis of right lower limb: Principal | ICD-10-CM | POA: Diagnosis present

## 2018-07-11 DIAGNOSIS — S0990XA Unspecified injury of head, initial encounter: Secondary | ICD-10-CM | POA: Diagnosis not present

## 2018-07-11 DIAGNOSIS — S8991XA Unspecified injury of right lower leg, initial encounter: Secondary | ICD-10-CM | POA: Diagnosis not present

## 2018-07-11 DIAGNOSIS — F419 Anxiety disorder, unspecified: Secondary | ICD-10-CM | POA: Diagnosis present

## 2018-07-11 DIAGNOSIS — K219 Gastro-esophageal reflux disease without esophagitis: Secondary | ICD-10-CM | POA: Diagnosis present

## 2018-07-11 DIAGNOSIS — J45909 Unspecified asthma, uncomplicated: Secondary | ICD-10-CM

## 2018-07-11 DIAGNOSIS — Z9109 Other allergy status, other than to drugs and biological substances: Secondary | ICD-10-CM

## 2018-07-11 DIAGNOSIS — Y9389 Activity, other specified: Secondary | ICD-10-CM

## 2018-07-11 DIAGNOSIS — Y92018 Other place in single-family (private) house as the place of occurrence of the external cause: Secondary | ICD-10-CM | POA: Insufficient documentation

## 2018-07-11 DIAGNOSIS — E039 Hypothyroidism, unspecified: Secondary | ICD-10-CM

## 2018-07-11 DIAGNOSIS — M549 Dorsalgia, unspecified: Secondary | ICD-10-CM | POA: Diagnosis present

## 2018-07-11 DIAGNOSIS — W19XXXA Unspecified fall, initial encounter: Secondary | ICD-10-CM | POA: Insufficient documentation

## 2018-07-11 DIAGNOSIS — Z9081 Acquired absence of spleen: Secondary | ICD-10-CM

## 2018-07-11 DIAGNOSIS — G301 Alzheimer's disease with late onset: Secondary | ICD-10-CM | POA: Insufficient documentation

## 2018-07-11 DIAGNOSIS — G8929 Other chronic pain: Secondary | ICD-10-CM | POA: Diagnosis present

## 2018-07-11 DIAGNOSIS — Z887 Allergy status to serum and vaccine status: Secondary | ICD-10-CM

## 2018-07-11 DIAGNOSIS — G309 Alzheimer's disease, unspecified: Secondary | ICD-10-CM | POA: Diagnosis present

## 2018-07-11 DIAGNOSIS — M1990 Unspecified osteoarthritis, unspecified site: Secondary | ICD-10-CM | POA: Diagnosis present

## 2018-07-11 DIAGNOSIS — F0280 Dementia in other diseases classified elsewhere without behavioral disturbance: Secondary | ICD-10-CM | POA: Diagnosis present

## 2018-07-11 DIAGNOSIS — R0902 Hypoxemia: Secondary | ICD-10-CM | POA: Diagnosis not present

## 2018-07-11 DIAGNOSIS — Z885 Allergy status to narcotic agent status: Secondary | ICD-10-CM

## 2018-07-11 DIAGNOSIS — R402254 Coma scale, best verbal response, oriented, 24 hours or more after hospital admission: Secondary | ICD-10-CM | POA: Diagnosis not present

## 2018-07-11 DIAGNOSIS — R600 Localized edema: Secondary | ICD-10-CM | POA: Diagnosis not present

## 2018-07-11 DIAGNOSIS — Z86711 Personal history of pulmonary embolism: Secondary | ICD-10-CM

## 2018-07-11 DIAGNOSIS — Z7952 Long term (current) use of systemic steroids: Secondary | ICD-10-CM

## 2018-07-11 DIAGNOSIS — Y999 Unspecified external cause status: Secondary | ICD-10-CM

## 2018-07-11 DIAGNOSIS — M79661 Pain in right lower leg: Secondary | ICD-10-CM

## 2018-07-11 DIAGNOSIS — G2581 Restless legs syndrome: Secondary | ICD-10-CM | POA: Diagnosis present

## 2018-07-11 DIAGNOSIS — I10 Essential (primary) hypertension: Secondary | ICD-10-CM | POA: Insufficient documentation

## 2018-07-11 DIAGNOSIS — R296 Repeated falls: Secondary | ICD-10-CM | POA: Diagnosis present

## 2018-07-11 DIAGNOSIS — D693 Immune thrombocytopenic purpura: Secondary | ICD-10-CM | POA: Diagnosis present

## 2018-07-11 DIAGNOSIS — K449 Diaphragmatic hernia without obstruction or gangrene: Secondary | ICD-10-CM | POA: Diagnosis present

## 2018-07-11 DIAGNOSIS — M79604 Pain in right leg: Secondary | ICD-10-CM

## 2018-07-11 DIAGNOSIS — F329 Major depressive disorder, single episode, unspecified: Secondary | ICD-10-CM | POA: Diagnosis present

## 2018-07-11 DIAGNOSIS — M810 Age-related osteoporosis without current pathological fracture: Secondary | ICD-10-CM | POA: Diagnosis present

## 2018-07-11 DIAGNOSIS — R402364 Coma scale, best motor response, obeys commands, 24 hours or more after hospital admission: Secondary | ICD-10-CM | POA: Diagnosis not present

## 2018-07-11 DIAGNOSIS — Z888 Allergy status to other drugs, medicaments and biological substances status: Secondary | ICD-10-CM

## 2018-07-11 DIAGNOSIS — Z7951 Long term (current) use of inhaled steroids: Secondary | ICD-10-CM

## 2018-07-11 DIAGNOSIS — Z886 Allergy status to analgesic agent status: Secondary | ICD-10-CM

## 2018-07-11 HISTORY — DX: Dementia in other diseases classified elsewhere, unspecified severity, without behavioral disturbance, psychotic disturbance, mood disturbance, and anxiety: F02.80

## 2018-07-11 HISTORY — DX: Alzheimer's disease, unspecified: G30.9

## 2018-07-11 LAB — CBC
HCT: 31.8 % — ABNORMAL LOW (ref 35.0–47.0)
Hemoglobin: 10.2 g/dL — ABNORMAL LOW (ref 12.0–16.0)
MCH: 26 pg (ref 26.0–34.0)
MCHC: 32 g/dL (ref 32.0–36.0)
MCV: 81.2 fL (ref 80.0–100.0)
Platelets: 180 K/uL (ref 150–440)
RBC: 3.92 MIL/uL (ref 3.80–5.20)
RDW: 18.6 % — ABNORMAL HIGH (ref 11.5–14.5)
WBC: 14.3 K/uL — ABNORMAL HIGH (ref 3.6–11.0)

## 2018-07-11 LAB — URINALYSIS, COMPLETE (UACMP) WITH MICROSCOPIC
Bacteria, UA: NONE SEEN
Bilirubin Urine: NEGATIVE
Glucose, UA: NEGATIVE mg/dL
Hgb urine dipstick: NEGATIVE
Ketones, ur: NEGATIVE mg/dL
Leukocytes, UA: NEGATIVE
Nitrite: NEGATIVE
Protein, ur: NEGATIVE mg/dL
Specific Gravity, Urine: 1.03 (ref 1.005–1.030)
pH: 5 (ref 5.0–8.0)

## 2018-07-11 LAB — BASIC METABOLIC PANEL WITH GFR
Anion gap: 7 (ref 5–15)
BUN: 25 mg/dL — ABNORMAL HIGH (ref 8–23)
CO2: 28 mmol/L (ref 22–32)
Calcium: 9.1 mg/dL (ref 8.9–10.3)
Chloride: 108 mmol/L (ref 98–111)
Creatinine, Ser: 1.02 mg/dL — ABNORMAL HIGH (ref 0.44–1.00)
GFR calc Af Amer: 58 mL/min — ABNORMAL LOW
GFR calc non Af Amer: 50 mL/min — ABNORMAL LOW
Glucose, Bld: 83 mg/dL (ref 70–99)
Potassium: 3.9 mmol/L (ref 3.5–5.1)
Sodium: 143 mmol/L (ref 135–145)

## 2018-07-11 MED ORDER — CEPHALEXIN 500 MG PO CAPS
500.0000 mg | ORAL_CAPSULE | Freq: Once | ORAL | Status: AC
  Administered 2018-07-11: 500 mg via ORAL
  Filled 2018-07-11: qty 1

## 2018-07-11 MED ORDER — LIDOCAINE HCL (PF) 1 % IJ SOLN
INTRAMUSCULAR | Status: AC
  Filled 2018-07-11: qty 5

## 2018-07-11 MED ORDER — CEPHALEXIN 500 MG PO CAPS: 500.0000 mg | ORAL_CAPSULE | Freq: Three times a day (TID) | ORAL | 0 refills | Status: DC

## 2018-07-11 NOTE — ED Notes (Signed)
Per Dr Quentin Cornwall place ice pack on fluid filled area to right lower leg - ice to stay in place 15-20 minutes and then wrap with coban for compression Ice pack applied at this time

## 2018-07-11 NOTE — Discharge Instructions (Signed)
Keep leg elevated.  Return for any fevers worsening pain, numbness or tingling or for any additional questions or concerns.  Please return tomorrow or within 24 to 48 hours PCP or wound care clinic.  I will be here for the next 2 days and will be happy to reevaluate the wound and redressed wound if you are unable to get into other clinic.

## 2018-07-11 NOTE — ED Triage Notes (Addendum)
Pt arrived via POV with husband, pt fell off computer chair and tried to get herself up in to the bed. Pt has large hematoma to right shin and abrasion to right knee that was present before falling last night, but re-injured it.    Husband states pt has hx of alzheimer's and low platelets.   Husband states pt has fallen 5 times in the past 4 days.

## 2018-07-11 NOTE — ED Provider Notes (Signed)
[facility] Emergency Department Provider Note    First MD Initiated Contact with Patient 07/11/18 1712     (approximate)  I have reviewed the triage vital signs and the nursing notes.   HISTORY  Chief Complaint Fall and Leg Pain    HPI Ariana [name] is a 84 y.o. female below listed past medical history presents the ER for swelling and pain to the right leg occurred after several falls over the past several days.  Patient states to use a walker to ambulate but family states that the walker is too large to safely use in their home.  Denies hitting her head.  No nausea or vomiting.  States they presented to the ER because she does bruise easily and does have a history of low platelets and after she bumped her right leg it today it has swelled up rapidly and has a large hematoma in that area.    Past Medical History:  Diagnosis Date  . Alzheimer disease   . Anemia   . Anxiety   . Asthma   . Chronic back pain   . Depression   . Depression   . GERD (gastroesophageal reflux disease)   . Hiatal hernia   . Hypercholesteremia   . ITP (idiopathic thrombocytopenic purpura)   . Osteoarthritis   . Osteoporosis   . Pulmonary emboli ([place])   . Restless legs    Family History  Problem Relation Age of Onset  . Stroke Mother    Past Surgical History:  Procedure Laterality Date  . CARPAL TUNNEL RELEASE    . ESOPHAGOGASTRODUODENOSCOPY (EGD) WITH PROPOFOL N/A 09/10/2015   Procedure: ESOPHAGOGASTRODUODENOSCOPY (EGD) WITH PROPOFOL;  Surgeon: Lollie Sails, MD;  Location: [facility] ENDOSCOPY;  Service: Endoscopy;  Laterality: N/A;  . LUMBAR [place]    . SPLENECTOMY, PARTIAL    . TOTAL ABDOMINAL HYSTERECTOMY     Patient Active Problem List   Diagnosis Date Noted  . Asthma without status asthmaticus 11/25/2016  . Late onset Alzheimer's disease without behavioral disturbance 12/30/2015  . Back muscle spasm 09/09/2015  . Clinical depression  04/12/2015  . Herpes zona 04/12/2015  . Chronic ITP (idiopathic thrombocytopenic purpura) (HCC) 03/15/2015  . Chest pain 10/22/2014  . Breathlessness on exertion 10/22/2014  . Osteopenia 10/18/2014  . Benign essential HTN 06/28/2014  . HLD (hyperlipidemia) 06/28/2014  . Acquired hypothyroidism 06/04/2014  . Anxiety and depression 06/04/2014  . Bursitis, trochanteric 03/27/2014  . DDD (degenerative disc disease), lumbar 02/27/2014  . Neuritis or radiculitis due to rupture of lumbar intervertebral disc 02/27/2014      Prior to Admission medications   Medication Sig Start Date End Date Taking? Authorizing Provider  acetaminophen (TYLENOL) 325 MG tablet Take 2 tablets (650 mg total) by mouth every 6 (six) hours as needed for mild pain (or Fever >/= 101). 07/02/15   Idelle Crouch, MD  albuterol (PROVENTIL HFA;VENTOLIN HFA) 108 (90 BASE) MCG/ACT inhaler Inhale 2 puffs into the lungs every 6 (six) hours as needed for wheezing or shortness of breath.    [provider]  ALPRAZolam Duanne Moron) 0.5 MG tablet Take 0.5 mg by mouth 3 (three) times daily as needed for anxiety.  03/11/17   [provider]  buPROPion (WELLBUTRIN XL) 150 MG 24 hr tablet Take 150 mg by mouth daily.    [provider]  Calcium Carbonate-Vitamin D (CALCIUM 600+D) 600-400 MG-UNIT per tablet Take 1 tablet by mouth daily.    [provider]  cephALEXin (  KEFLEX) 500 MG capsule Take 1 capsule (500 mg total) by mouth 3 (three) times daily for 7 days. 07/11/18 07/18/18  Merlyn Lot, MD  citalopram (CELEXA) 40 MG tablet Take 40 mg by mouth daily.    [provider]  Cyanocobalamin (RA VITAMIN B-12 TR) 1000 MCG TBCR Take 1,000 mcg by mouth daily.     [provider]  donepezil (ARICEPT) 5 MG tablet Take 10 mg by mouth at bedtime.  10/18/14   [provider]  fluticasone (FLONASE) 50 MCG/ACT nasal spray Place into the nose. 03/03/17   [provider]    Fluticasone-Salmeterol (ADVAIR) 250-50 MCG/DOSE AEPB Inhale 1 puff into the lungs daily as needed.     [provider]  gabapentin (NEURONTIN) 300 MG capsule Take 300 mg by mouth 4 (four) times daily.    [provider]  HYDROcodone-acetaminophen (NORCO) 7.5-325 MG tablet 1 tablet by mouth q 6 h prn 11/14/16   [provider]  levothyroxine (SYNTHROID, LEVOTHROID) 50 MCG tablet Take 50 mcg by mouth daily before breakfast. Take 30 to 60 minutes before breakfast.    [provider]  memantine (NAMENDA) 5 MG tablet Take 5 mg by mouth 2 (two) times daily.  08/19/15 03/21/18  [provider]  montelukast (SINGULAIR) 10 MG tablet Take by mouth. 03/16/17 03/21/18  [provider]  Multiple Vitamins-Minerals (HAIR SKIN AND NAILS FORMULA PO) Take 1 tablet by mouth every evening.    [provider]  Multiple Vitamins-Minerals (OCUVITE-LUTEIN PO) Take 1 capsule by mouth daily.     [provider]  oxybutynin (DITROPAN) 5 MG tablet Take 5 mg by mouth 3 (three) times daily.    [provider]  pantoprazole (PROTONIX) 20 MG tablet Take 20 mg by mouth 2 (two) times daily.  07/24/15   [provider]  pravastatin (PRAVACHOL) 20 MG tablet Take 20 mg by mouth daily.    [provider]  predniSONE (DELTASONE) 10 MG tablet  01/31/18   [provider]  prochlorperazine (COMPAZINE) 10 MG tablet Take 1 tablet (10 mg total) by mouth every 6 (six) hours as needed for nausea or vomiting. Patient not taking: Reported on 06/20/2018 11/25/17   Lloyd Huger, MD  traMADol (ULTRAM) 50 MG tablet Take 50 mg by mouth 3 (three) times daily as needed for moderate pain or severe pain. pain    [provider]  vitamin C (ASCORBIC ACID) 500 MG tablet Take 500 mg by mouth daily.    [provider]  vitamin E (E-400) 400 UNIT capsule Take 400 Units by mouth daily.    [provider]    Allergies Aspirin;  Diazepam; Morphine; Other; and Tetanus toxoids    Social History Social History   Tobacco Use  . Smoking status: Never Smoker  . Smokeless tobacco: Never Used  Substance Use Topics  . Alcohol use: No    Alcohol/week: 0.0 standard drinks  . Drug use: No    Review of Systems Patient denies headaches, rhinorrhea, blurry vision, numbness, shortness of breath, chest pain, edema, cough, abdominal pain, nausea, vomiting, diarrhea, dysuria, fevers, rashes or hallucinations unless otherwise stated above in HPI. ____________________________________________   PHYSICAL EXAM:  VITAL SIGNS: Vitals:   07/11/18 1308 07/11/18 2030  BP: (!) 128/57 (!) 144/64  Pulse: (!) 59 (!) 55  Resp:  14  Temp: 98.3 F (36.8 C)   SpO2: 100% 99%    Constitutional: Alert, pleasant  Eyes: Conjunctivae are normal.  Head: Atraumatic.  Nose: No congestion/rhinnorhea. Mouth/Throat: Mucous membranes are moist.   Neck: No stridor. Painless ROM.  Cardiovascular: Normal rate, regular rhythm. Grossly normal heart sounds.  Good peripheral circulation. Respiratory: Normal respiratory effort.  No retractions. Lungs CTAB. Gastrointestinal: Soft and nontender. No distention. No abdominal bruits. No CVA tenderness. Genitourinary:  Musculoskeletal: Large 10 cm x 5 cm tense hematoma on the right anterior shin.  No surrounding erythema.  Does have bilateral lower extremity venous stasis changes.  No crepitus.  There is some bony tenderness to palpation along the fibula of the right leg. no joint effusions. Neurologic:  Normal speech and language. No gross focal neurologic deficits are appreciated. No facial droop Skin:  Skin is warm, dry and intact. No rash noted. Psychiatric: Mood and affect are normal. Speech and behavior are normal.  ____________________________________________   LABS (all labs ordered are listed, but only abnormal results are displayed)  Results for orders placed or performed during the hospital  encounter of 07/11/18 (from the past 24 hour(s))  Basic metabolic panel     Status: Abnormal   Collection Time: 07/11/18  1:35 PM  Result Value Ref Range   Sodium 143 135 - 145 mmol/L   Potassium 3.9 3.5 - 5.1 mmol/L   Chloride 108 98 - 111 mmol/L   CO2 28 22 - 32 mmol/L   Glucose, Bld 83 70 - 99 mg/dL   BUN 25 (H) 8 - 23 mg/dL   Creatinine, Ser 1.02 (H) 0.44 - 1.00 mg/dL   Calcium 9.1 8.9 - 10.3 mg/dL   GFR calc non Af Amer 50 (L) >60 mL/min   GFR calc Af Amer 58 (L) >60 mL/min   Anion gap 7 5 - 15  CBC     Status: Abnormal   Collection Time: 07/11/18  1:35 PM  Result Value Ref Range   WBC 14.3 (H) 3.6 - 11.0 K/uL   RBC 3.92 3.80 - 5.20 MIL/uL   Hemoglobin 10.2 (L) 12.0 - 16.0 g/dL   HCT 31.8 (L) 35.0 - 47.0 %   MCV 81.2 80.0 - 100.0 fL   MCH 26.0 26.0 - 34.0 pg   MCHC 32.0 32.0 - 36.0 g/dL   RDW 18.6 (H) 11.5 - 14.5 %   Platelets 180 150 - 440 K/uL  Urinalysis, Complete w Microscopic     Status: Abnormal   Collection Time: 07/11/18  1:35 PM  Result Value Ref Range   Color, Urine YELLOW (A) YELLOW   APPearance CLEAR (A) CLEAR   Specific Gravity, Urine 1.030 1.005 - 1.030   pH 5.0 5.0 - 8.0   Glucose, UA NEGATIVE NEGATIVE mg/dL   Hgb urine dipstick NEGATIVE NEGATIVE   Bilirubin Urine NEGATIVE NEGATIVE   Ketones, ur NEGATIVE NEGATIVE mg/dL   Protein, ur NEGATIVE NEGATIVE mg/dL   Nitrite NEGATIVE NEGATIVE   Leukocytes, UA NEGATIVE NEGATIVE   RBC / HPF 0-5 0 - 5 RBC/hpf   WBC, UA 0-5 0 - 5 WBC/hpf   Bacteria, UA NONE SEEN NONE SEEN   Squamous Epithelial / LPF 0-5 0 - 5   Mucus PRESENT    ____________________________________________  EKG My review and personal interpretation at Time: 20:29   Indication: fall Rate: 55  Rhythm: sinus Axis: normal Other: normal intervals, no stemi ____________________________________________  RADIOLOGY  I personally reviewed all radiographic images ordered to evaluate for the above acute complaints and reviewed radiology reports and  findings.  These findings were personally discussed with the patient.  Please see medical record for radiology  report.  ____________________________________________   PROCEDURES  Procedure(s) performed:  Marland KitchenMarland KitchenIncision and Drainage Date/Time: 07/11/2018 8:22 PM Performed by: Merlyn Lot, MD Authorized by: Merlyn Lot, MD   Consent:    Consent obtained:  Verbal   Consent given by:  Patient   Risks discussed:  Bleeding, infection, incomplete drainage, pain and damage to other organs   Alternatives discussed:  Alternative treatment, delayed treatment, observation, referral and no treatment Location:    Type:  Hematoma   Size:  10 x 5 cm   Location:  Lower extremity   Lower extremity location:  Leg   Leg location:  R lower leg Pre-procedure details:    Skin preparation:  Betadine Anesthesia (see MAR for exact dosages):    Anesthesia method:  Local infiltration   Local anesthetic:  Lidocaine 1% w/o epi Procedure type:    Complexity:  Simple Procedure details:    Needle aspiration: yes     Needle size:  18 G   Drainage:  Bloody   Drainage amount:  Moderate   Wound treatment:  Wound left open Post-procedure details:    Patient tolerance of procedure:  Tolerated well, no immediate complications      Critical Care performed: no ____________________________________________   INITIAL IMPRESSION / ASSESSMENT AND PLAN / ED COURSE  Pertinent labs & imaging results that were available during my care of the patient were reviewed by me and considered in my medical decision making (see chart for details).   DDX: Contusion, hematoma, fracture, cellulitis, infection, subdural hematoma, head injury, UTI, coagulopathy  Ariana [name] is a 84 y.o. who presents to the ED with with symptoms as described above.  CT imaging will be ordered to evaluate for traumatic brain injury given recent falls and history of low platelets.  Does have large hematoma of the right leg.   Will check blood work as well as order x-ray to evaluate for fracture.  Doubt infectious process given history and presentation.  Clinical Course as of Jul 11 2044  [facility] Jul 11, 2018  1947 X-ray without any evidence of fracture.  Given the size of the tense hematoma on the right anterior leg discussed risks benefits and recommend needle aspiration.  Patient and family agreed to this.  18-gauge needle was inserted for aspiration of hematoma with incomplete evacuation but significant decrease in tension and pressure on hematoma.  Brought down roughly by 50% in size.  There was extensively cleaned with Betadine.  Subsequently dressed in Xeroform and wrapped with Coban.   [PR]  2017 She is able to ambulate.  Given dose of Keflex.  No evidence of necrotizing deep space infection.  Patient will be given referral to wound clinic instructed to return to the ER next 24 to 48 hours for repeat wound check.  Discussed supportive management.  Have discussed supportive management at home.  Will be given referral to home health.   [PR]    Clinical Course User Index [PR] Merlyn Lot, MD     As part of my medical decision making, I reviewed the following data within the [place] notes reviewed and incorporated, Labs reviewed, notes from prior ED visits and [place] Controlled Substance Database   ____________________________________________   FINAL CLINICAL IMPRESSION(S) / ED DIAGNOSES  Final diagnoses:  Right leg pain  Hematoma of right lower extremity, initial encounter      NEW MEDICATIONS STARTED DURING THIS VISIT:  New Prescriptions   CEPHALEXIN (KEFLEX) 500 MG CAPSULE    Take 1  capsule (500 mg total) by mouth 3 (three) times daily for 7 days.     Note:  This document was prepared using Dragon voice recognition software and may include unintentional dictation errors.    Merlyn Lot, MD 07/11/18 2045

## 2018-07-11 NOTE — ED Notes (Signed)
Patient transported to X-ray 

## 2018-07-11 NOTE — ED Notes (Signed)
Pt reports that she fell last night walking into the bedroom injuring right lower leg - she reports that since fall she has not been able to bear weight on the right leg - the lower right leg from knee to ankle is bruised/purple with a large fluid filled area to inner aspect of right lower leg - lower right leg is swollen - Dr Quentin Cornwall at bedside

## 2018-07-11 NOTE — ED Notes (Signed)
Ambulated pt around room with walker successfully.

## 2018-07-12 ENCOUNTER — Emergency Department: Payer: Medicare HMO

## 2018-07-12 ENCOUNTER — Other Ambulatory Visit: Payer: Self-pay

## 2018-07-12 ENCOUNTER — Encounter: Payer: Self-pay | Admitting: Emergency Medicine

## 2018-07-12 ENCOUNTER — Inpatient Hospital Stay
Admission: EM | Admit: 2018-07-12 | Discharge: 2018-07-15 | DRG: 603 | Disposition: A | Discharge status: Skilled Nursing Facility | Payer: Medicare HMO | Attending: Internal Medicine | Admitting: Internal Medicine

## 2018-07-12 DIAGNOSIS — R52 Pain, unspecified: Secondary | ICD-10-CM | POA: Diagnosis not present

## 2018-07-12 DIAGNOSIS — S8011XA Contusion of right lower leg, initial encounter: Secondary | ICD-10-CM | POA: Diagnosis present

## 2018-07-12 DIAGNOSIS — Z86711 Personal history of pulmonary embolism: Secondary | ICD-10-CM | POA: Diagnosis not present

## 2018-07-12 DIAGNOSIS — L089 Local infection of the skin and subcutaneous tissue, unspecified: Secondary | ICD-10-CM

## 2018-07-12 DIAGNOSIS — D693 Immune thrombocytopenic purpura: Secondary | ICD-10-CM | POA: Diagnosis not present

## 2018-07-12 DIAGNOSIS — K219 Gastro-esophageal reflux disease without esophagitis: Secondary | ICD-10-CM | POA: Diagnosis not present

## 2018-07-12 DIAGNOSIS — T148XXA Other injury of unspecified body region, initial encounter: Secondary | ICD-10-CM

## 2018-07-12 DIAGNOSIS — R600 Localized edema: Secondary | ICD-10-CM | POA: Diagnosis not present

## 2018-07-12 DIAGNOSIS — J45901 Unspecified asthma with (acute) exacerbation: Secondary | ICD-10-CM | POA: Diagnosis not present

## 2018-07-12 DIAGNOSIS — F0390 Unspecified dementia without behavioral disturbance: Secondary | ICD-10-CM | POA: Diagnosis not present

## 2018-07-12 DIAGNOSIS — D649 Anemia, unspecified: Secondary | ICD-10-CM | POA: Diagnosis not present

## 2018-07-12 LAB — BASIC METABOLIC PANEL
ANION GAP: 8 (ref 5–15)
BUN: 21 mg/dL (ref 8–23)
CALCIUM: 8.9 mg/dL (ref 8.9–10.3)
CO2: 26 mmol/L (ref 22–32)
Chloride: 105 mmol/L (ref 98–111)
Creatinine, Ser: 0.91 mg/dL (ref 0.44–1.00)
GFR, EST NON AFRICAN AMERICAN: 57 mL/min — AB (ref >60)
Glucose, Bld: 75 mg/dL (ref 70–99)
Potassium: 3.6 mmol/L (ref 3.5–5.1)
Sodium: 139 mmol/L (ref 135–145)

## 2018-07-12 LAB — CBC WITH DIFFERENTIAL/PLATELET
BASOS ABS: 0.2 10*3/uL — AB (ref 0–0.1)
Basophils Relative: 1 %
EOS ABS: 0.2 10*3/uL (ref 0–0.7)
EOS PCT: 1 %
HCT: 32.9 % — ABNORMAL LOW (ref 35.0–47.0)
Hemoglobin: 10.6 g/dL — ABNORMAL LOW (ref 12.0–16.0)
LYMPHS PCT: 25 %
Lymphs Abs: 4 10*3/uL — ABNORMAL HIGH (ref 1.0–3.6)
MCH: 26.7 pg (ref 26.0–34.0)
MCHC: 32.2 g/dL (ref 32.0–36.0)
MCV: 82.9 fL (ref 80.0–100.0)
Monocytes Absolute: 1.5 10*3/uL — ABNORMAL HIGH (ref 0.2–0.9)
Monocytes Relative: 9 %
Neutro Abs: 10.5 10*3/uL — ABNORMAL HIGH (ref 1.4–6.5)
Neutrophils Relative %: 64 %
PLATELETS: 149 10*3/uL — AB (ref 150–440)
RBC: 3.97 MIL/uL (ref 3.80–5.20)
RDW: 19.1 % — AB (ref 11.5–14.5)
WBC: 16.4 10*3/uL — AB (ref 3.6–11.0)

## 2018-07-12 MED ORDER — BUPROPION HCL ER (XL) 150 MG PO TB24
150.0000 mg | ORAL_TABLET | Freq: Every day | ORAL | Status: DC
  Administered 2018-07-13 – 2018-07-15 (×3): 150 mg via ORAL
  Filled 2018-07-12 (×3): qty 1

## 2018-07-12 MED ORDER — LEVOTHYROXINE SODIUM 50 MCG PO TABS
50.0000 ug | ORAL_TABLET | Freq: Every day | ORAL | Status: DC
  Administered 2018-07-12 – 2018-07-15 (×4): 50 ug via ORAL
  Filled 2018-07-12 (×4): qty 1

## 2018-07-12 MED ORDER — MORPHINE SULFATE (PF) 2 MG/ML IV SOLN
2.0000 mg | INTRAVENOUS | Status: DC | PRN
  Administered 2018-07-13 – 2018-07-14 (×5): 2 mg via INTRAVENOUS
  Filled 2018-07-12 (×5): qty 1

## 2018-07-12 MED ORDER — SODIUM CHLORIDE 0.9 % IV SOLN
1.0000 g | Freq: Once | INTRAVENOUS | Status: AC
  Administered 2018-07-12: 1 g via INTRAVENOUS
  Filled 2018-07-12: qty 10

## 2018-07-12 MED ORDER — GABAPENTIN 300 MG PO CAPS
300.0000 mg | ORAL_CAPSULE | Freq: Four times a day (QID) | ORAL | Status: DC
  Administered 2018-07-12 – 2018-07-15 (×10): 300 mg via ORAL
  Filled 2018-07-12 (×10): qty 1

## 2018-07-12 MED ORDER — CITALOPRAM HYDROBROMIDE 20 MG PO TABS
40.0000 mg | ORAL_TABLET | Freq: Every day | ORAL | Status: DC
  Administered 2018-07-13 – 2018-07-15 (×3): 40 mg via ORAL
  Filled 2018-07-12 (×3): qty 2

## 2018-07-12 MED ORDER — ALPRAZOLAM 0.5 MG PO TABS
0.5000 mg | ORAL_TABLET | Freq: Three times a day (TID) | ORAL | Status: DC | PRN
  Administered 2018-07-12 – 2018-07-14 (×3): 0.5 mg via ORAL
  Filled 2018-07-12 (×3): qty 1

## 2018-07-12 MED ORDER — MONTELUKAST SODIUM 10 MG PO TABS
10.0000 mg | ORAL_TABLET | Freq: Every day | ORAL | Status: DC
  Administered 2018-07-12 – 2018-07-14 (×3): 10 mg via ORAL
  Filled 2018-07-12 (×3): qty 1

## 2018-07-12 MED ORDER — MEMANTINE HCL 5 MG PO TABS
5.0000 mg | ORAL_TABLET | Freq: Two times a day (BID) | ORAL | Status: DC
  Administered 2018-07-12 – 2018-07-15 (×6): 5 mg via ORAL
  Filled 2018-07-12 (×6): qty 1

## 2018-07-12 MED ORDER — DOCUSATE SODIUM 100 MG PO CAPS
100.0000 mg | ORAL_CAPSULE | Freq: Two times a day (BID) | ORAL | Status: DC
  Administered 2018-07-12 – 2018-07-15 (×6): 100 mg via ORAL
  Filled 2018-07-12 (×6): qty 1

## 2018-07-12 MED ORDER — BACITRACIN 500 UNIT/GM EX OINT
1.0000 "application " | TOPICAL_OINTMENT | Freq: Once | CUTANEOUS | Status: DC
  Filled 2018-07-12: qty 14

## 2018-07-12 MED ORDER — BACITRACIN ZINC 500 UNIT/GM EX OINT
TOPICAL_OINTMENT | Freq: Once | CUTANEOUS | Status: DC
  Filled 2018-07-12: qty 0.9

## 2018-07-12 MED ORDER — BISACODYL 10 MG RE SUPP
10.0000 mg | Freq: Every day | RECTAL | Status: DC | PRN
  Administered 2018-07-15: 10 mg via RECTAL
  Filled 2018-07-12 (×2): qty 1

## 2018-07-12 MED ORDER — HYDROCODONE-ACETAMINOPHEN 5-325 MG PO TABS
1.0000 | ORAL_TABLET | Freq: Once | ORAL | Status: AC
  Administered 2018-07-12: 1 via ORAL
  Filled 2018-07-12: qty 1

## 2018-07-12 MED ORDER — DONEPEZIL HCL 5 MG PO TABS
10.0000 mg | ORAL_TABLET | Freq: Every day | ORAL | Status: DC
  Administered 2018-07-12 – 2018-07-14 (×3): 10 mg via ORAL
  Filled 2018-07-12 (×4): qty 2

## 2018-07-12 MED ORDER — HYDROCODONE-ACETAMINOPHEN 5-325 MG PO TABS
1.0000 | ORAL_TABLET | Freq: Four times a day (QID) | ORAL | Status: DC | PRN
  Administered 2018-07-13: 2 via ORAL
  Administered 2018-07-13: 1 via ORAL
  Administered 2018-07-13 – 2018-07-15 (×5): 2 via ORAL
  Filled 2018-07-12 (×5): qty 2
  Filled 2018-07-12: qty 1
  Filled 2018-07-12: qty 2

## 2018-07-12 MED ORDER — PREDNISONE 10 MG PO TABS
10.0000 mg | ORAL_TABLET | Freq: Every day | ORAL | Status: DC
  Administered 2018-07-13 – 2018-07-15 (×3): 10 mg via ORAL
  Filled 2018-07-12 (×3): qty 1

## 2018-07-12 MED ORDER — ACETAMINOPHEN 650 MG RE SUPP: 650.0000 mg | Freq: Four times a day (QID) | RECTAL | Status: DC | PRN

## 2018-07-12 MED ORDER — ONDANSETRON HCL 4 MG PO TABS: 4.0000 mg | ORAL_TABLET | Freq: Four times a day (QID) | ORAL | Status: DC | PRN

## 2018-07-12 MED ORDER — FLUTICASONE PROPIONATE 50 MCG/ACT NA SUSP
2.0000 | Freq: Every day | NASAL | Status: DC
  Administered 2018-07-13 – 2018-07-14 (×2): 2 via NASAL
  Filled 2018-07-12: qty 16

## 2018-07-12 MED ORDER — ACETAMINOPHEN 325 MG PO TABS
650.0000 mg | ORAL_TABLET | Freq: Four times a day (QID) | ORAL | Status: DC | PRN
  Administered 2018-07-12: 650 mg via ORAL
  Filled 2018-07-12: qty 2

## 2018-07-12 MED ORDER — ONDANSETRON HCL 4 MG/2ML IJ SOLN: 4.0000 mg | Freq: Four times a day (QID) | INTRAMUSCULAR | Status: DC | PRN

## 2018-07-12 MED ORDER — OXYBUTYNIN CHLORIDE 5 MG PO TABS
5.0000 mg | ORAL_TABLET | Freq: Three times a day (TID) | ORAL | Status: DC
  Administered 2018-07-12 – 2018-07-15 (×8): 5 mg via ORAL
  Filled 2018-07-12 (×10): qty 1

## 2018-07-12 MED ORDER — POLYETHYLENE GLYCOL 3350 17 G PO PACK: 17.0000 g | PACK | Freq: Every day | ORAL | Status: DC | PRN

## 2018-07-12 MED ORDER — PRAVASTATIN SODIUM 20 MG PO TABS
20.0000 mg | ORAL_TABLET | Freq: Every day | ORAL | Status: DC
  Administered 2018-07-13 – 2018-07-15 (×3): 20 mg via ORAL
  Filled 2018-07-12 (×3): qty 1

## 2018-07-12 MED ORDER — ALBUTEROL SULFATE (2.5 MG/3ML) 0.083% IN NEBU: 2.5000 mg | INHALATION_SOLUTION | RESPIRATORY_TRACT | Status: DC | PRN

## 2018-07-12 NOTE — H&P (View-Only) (Signed)
SURGICAL CONSULTATION NOTE   HISTORY OF PRESENT ILLNESS (HPI):  84 y.o. female presented to [facility] ED for evaluation of right leg hematoma. Patient reports fell from her feet one day ago and developed an hematoma on the right leg. She came to ED and the hematoma was lanced but it did not drained since it was clogged. Patient complain of pain on right leg. Pain does not radiates.  Pain aggravated when the leg is touched. Pain improved with pain medications. Denies denies fever or chills.  Surgery is consulted by Dr. Martina Sinner in this context for evaluation and management of infected leg hematoma.  PAST MEDICAL HISTORY (PMH):  Past Medical History:  Diagnosis Date  . Alzheimer disease ([place])   . Anemia   . Anxiety   . Asthma   . Chronic back pain   . Depression   . Depression   . GERD (gastroesophageal reflux disease)   . Hiatal hernia   . Hypercholesteremia   . ITP (idiopathic thrombocytopenic purpura)   . Osteoarthritis   . Osteoporosis   . Pulmonary emboli ([place])   . Restless legs      PAST SURGICAL HISTORY [facility]):  Past Surgical History:  Procedure Laterality Date  . CARPAL TUNNEL RELEASE    . ESOPHAGOGASTRODUODENOSCOPY (EGD) WITH PROPOFOL N/A 09/10/2015   Procedure: ESOPHAGOGASTRODUODENOSCOPY (EGD) WITH PROPOFOL;  Surgeon: Lollie Sails, MD;  Location: [facility] ENDOSCOPY;  Service: Endoscopy;  Laterality: N/A;  . LUMBAR [place]    . SPLENECTOMY, PARTIAL    . TOTAL ABDOMINAL HYSTERECTOMY       MEDICATIONS:  Prior to Admission medications   Medication Sig Start Date End Date Taking? Authorizing Provider  acetaminophen (TYLENOL) 325 MG tablet Take 2 tablets (650 mg total) by mouth every 6 (six) hours as needed for mild pain (or Fever >/= 101). 07/02/15  Yes Idelle Crouch, MD  albuterol (PROVENTIL HFA;VENTOLIN HFA) 108 (90 BASE) MCG/ACT inhaler Inhale 2 puffs into the lungs every 6 (six) hours as needed for wheezing or shortness of breath.   Yes [provider]   ALPRAZolam Duanne Moron) 0.5 MG tablet Take 0.5 mg by mouth 3 (three) times daily as needed for anxiety.  03/11/17  Yes [provider]  buPROPion (WELLBUTRIN XL) 150 MG 24 hr tablet Take 150 mg by mouth daily.   Yes [provider]  Calcium Carbonate-Vitamin D (CALCIUM 600+D) 600-400 MG-UNIT per tablet Take 1 tablet by mouth daily.   Yes [provider]  citalopram (CELEXA) 40 MG tablet Take 40 mg by mouth daily.   Yes [provider]  Cyanocobalamin (RA VITAMIN B-12 TR) 1000 MCG TBCR Take 1,000 mcg by mouth daily.    Yes [provider]  donepezil (ARICEPT) 5 MG tablet Take 10 mg by mouth at bedtime.  10/18/14  Yes [provider]  doxycycline (VIBRAMYCIN) 100 MG capsule Take 100 mg by mouth 2 (two) times daily. 07/08/18 07/18/18 Yes [provider]  fluticasone (FLONASE) 50 MCG/ACT nasal spray Place 2 sprays into both nostrils daily.  03/03/17  Yes [provider]  Fluticasone-Salmeterol (ADVAIR) 250-50 MCG/DOSE AEPB Inhale 1 puff into the lungs daily as needed.    Yes [provider]  gabapentin (NEURONTIN) 300 MG capsule Take 300 mg by mouth 4 (four) times daily.   Yes [provider]  HYDROcodone-acetaminophen (NORCO) 7.5-325 MG tablet Take 1 tablet by mouth 3 (three) times daily as needed for moderate pain or severe pain.    Yes [provider]  levothyroxine (SYNTHROID, LEVOTHROID) 50 MCG tablet Take 50 mcg by mouth daily before breakfast. Take 30 to 60 minutes before breakfast.   Yes [provider]  memantine (NAMENDA) 5 MG tablet Take 5 mg by mouth 2 (two) times daily.  08/19/15 07/12/18 Yes [provider]  montelukast (SINGULAIR) 10 MG tablet Take 10 mg by mouth at bedtime.    Yes [provider]  Multiple Vitamins-Minerals (HAIR SKIN AND NAILS FORMULA PO) Take 1 tablet by mouth every evening.   Yes [provider]  Multiple Vitamins-Minerals (OCUVITE-LUTEIN PO) Take  1 capsule by mouth daily.    Yes [provider]  oxybutynin (DITROPAN) 5 MG tablet Take 5 mg by mouth 3 (three) times daily.   Yes [provider]  pantoprazole (PROTONIX) 20 MG tablet Take 20 mg by mouth 2 (two) times daily.  07/24/15  Yes [provider]  pravastatin (PRAVACHOL) 20 MG tablet Take 20 mg by mouth daily.   Yes [provider]  predniSONE (DELTASONE) 10 MG tablet Take 10 mg by mouth daily.  01/31/18  Yes [provider]  traMADol (ULTRAM) 50 MG tablet Take 50 mg by mouth 3 (three) times daily as needed for moderate pain or severe pain. pain   Yes [provider]  vitamin C (ASCORBIC ACID) 500 MG tablet Take 500 mg by mouth daily.   Yes [provider]  vitamin E (E-400) 400 UNIT capsule Take 400 Units by mouth daily.   Yes [provider]  cephALEXin (KEFLEX) 500 MG capsule Take 1 capsule (500 mg total) by mouth 3 (three) times daily for 7 days. 07/11/18 07/18/18  Merlyn Lot, MD  prochlorperazine (COMPAZINE) 10 MG tablet Take 1 tablet (10 mg total) by mouth every 6 (six) hours as needed for nausea or vomiting. Patient not taking: Reported on 06/20/2018 11/25/17   Lloyd Huger, MD     ALLERGIES:  Allergies  Allergen Reactions  . Aspirin     Other reaction(s): Distress (finding)  . Diazepam     Other reaction(s): Itching of Skin sneezing  . Morphine     Other reaction(s): Itching of Skin  . Other     Other reaction(s): Unknown seasonal allergies  . Tetanus Toxoids     Other reaction(s): Localized superficial swelling of skin     SOCIAL HISTORY:  Social History   Socioeconomic History  . Marital status: Married    Spouse name: Not on file  . Number of children: Not on file  . Years of education: Not on file  . Highest education level: Not on file  Occupational History  . Not on file  Social Needs  . Financial resource strain: Not on file  . Food insecurity:    Worry: Not on file     Inability: Not on file  . Transportation needs:    Medical: Not on file    Non-medical: Not on file  Tobacco Use  . Smoking status: Never Smoker  . Smokeless tobacco: Never Used  Substance and Sexual Activity  . Alcohol use: No    Alcohol/week: 0.0 standard drinks  . Drug use: No  . Sexual activity: Not on file  Lifestyle  . Physical activity:    Days per week: Not on file    Minutes per session: Not on file  . Stress: Not on file  Relationships  . Social connections:    Talks on phone: Not on file    Gets together: Not on file  Attends religious service: Not on file    Active member of club or organization: Not on file    Attends meetings of clubs or organizations: Not on file    Relationship status: Not on file  . Intimate partner violence:    Fear of current or ex partner: Not on file    Emotionally abused: Not on file    Physically abused: Not on file    Forced sexual activity: Not on file  Other Topics Concern  . Not on file  Social History Narrative  . Not on file    FAMILY HISTORY:  Family History  Problem Relation Age of Onset  . Stroke Mother      REVIEW OF SYSTEMS:  Constitutional: denies weight loss, fever, chills, or sweats  Eyes: denies any other vision changes, history of eye injury  ENT: denies sore throat, hearing problems  Respiratory: denies shortness of breath, wheezing  Cardiovascular: denies chest pain, palpitations  Gastrointestinal: denies abdominal pain, N/V, or diarrhea Genitourinary: denies burning with urination or urinary frequency Musculoskeletal: positive right leg pain.  Skin: denies any other rashes or skin discolorations. Positive for hematoma and bruises Neurological: denies any other headache, dizziness, weakness  Psychiatric: denies any other depression, anxiety   All other review of systems were negative   VITAL SIGNS:  Temp:  [97.9 F (36.6 C)-98.6 F (37 C)] 97.9 F (36.6 C) (10/01 1814) Pulse Rate:  [62-80] 62  (10/01 1814) Resp:  [16-18] 18 (10/01 1814) BP: (147-148)/(61-69) 147/61 (10/01 1814) SpO2:  [96 %-99 %] 99 % (10/01 1814)             INTAKE/OUTPUT:  This shift: No intake/output data recorded.  Last 2 shifts: @IOLAST2SHIFTS @   PHYSICAL EXAM:  Constitutional:  -- Normal body habitus  -- Awake, alert, and oriented x3  Eyes:  -- Pupils equally round and reactive to light  -- No scleral icterus  Ear, nose, and throat:  -- No jugular venous distension  Pulmonary:  -- No crackles  -- Equal breath sounds bilaterally -- Breathing non-labored at rest Cardiovascular:  -- S1, S2 present  -- No pericardial rubs Gastrointestinal:  -- Abdomen soft, nontender, non-distended, no guarding or rebound tenderness -- No abdominal masses appreciated, pulsatile or otherwise  Musculoskeletal and Integumentary:  -- Right leg large hematoma with medial aspect with purulent secretions. Cellulitis. Bilateral extremity bruising.  Neurologic:  -- Motor function: intact and symmetric -- Sensation: intact and symmetric   Labs:  CBC Latest Ref Rng & Units 07/12/2018 07/11/2018 06/20/2018  WBC 3.6 - 11.0 K/uL 16.4(H) 14.3(H) 10.7(H)  Hemoglobin 12.0 - 16.0 g/dL 10.6(L) 10.2(L) 11.2(L)  Hematocrit 35.0 - 47.0 % 32.9(L) 31.8(L) 35.5  Platelets 150 - 440 K/uL 149(L) 180 35(L)   CMP Latest Ref Rng & Units 07/12/2018 07/11/2018 10/04/2017  Glucose 70 - 99 mg/dL 75 83 82  BUN 8 - 23 mg/dL 21 25(H) 25(H)  Creatinine 0.44 - 1.00 mg/dL 0.91 1.02(H) 0.96  Sodium 135 - 145 mmol/L 139 143 140  Potassium 3.5 - 5.1 mmol/L 3.6 3.9 3.7  Chloride 98 - 111 mmol/L 105 108 107  CO2 22 - 32 mmol/L 26 28 28   Calcium 8.9 - 10.3 mg/dL 8.9 9.1 8.9  Total Protein 6.5 - 8.1 g/dL - - 6.7  Total Bilirubin 0.3 - 1.2 mg/dL - - 0.5  Alkaline Phos 38 - 126 U/L - - 74  AST 15 - 41 U/L - - 22  ALT 14 - 54 U/L - -  13(L)     Imaging studies:  EXAM: RIGHT TIBIA AND FIBULA - 2 VIEW  COMPARISON:  07/11/2018  FINDINGS: No  fracture or malalignment. Diffuse soft tissue edema without radiopaque foreign body or soft tissue emphysema. Protuberant soft tissue mid aspect of the lower leg. No periostitis or bone destruction  IMPRESSION: 1. No acute osseous abnormality 2. Diffuse soft tissue edema without radiopaque foreign body or soft tissue emphysema.   Electronically Signed   By: Donavan Foil M.D.   On: 07/12/2018 16:05  Assessment/Plan:  84 y.o. female with infected right leg hematoma, complicated by pertinent comorbidities including ITP, dementia, anemia, asthma, GERD, history of PE.  Patient with easy bruising and bleeding most likely due to ITP. Presently controlled with platelets on 149-180. There are sings of infection on the medial area of the hematoma. This was discussed with patient. My recommendations are to take patient to OR for cleansing and debridement of the hematoma and the infection. Patient at this moment refers that wants to discuss with husband. Will place patient NPO to discuss with husband in the morning. Benefits and risk discussed. If debrided will heal faster but with pain (will be like second degree burn wound). If not debrided may improve with antibiotic but will take longer if infection can be controlled. Patient with increased WBC count, cellulitis and significant pain. Will re evaluate in the morning.   Arnold Long, MD

## 2018-07-12 NOTE — Consult Note (Signed)
SURGICAL CONSULTATION NOTE   HISTORY OF PRESENT ILLNESS (HPI):  84 y.o. female presented to [facility] ED for evaluation of right leg hematoma. Patient reports fell from her feet one day ago and developed an hematoma on the right leg. She came to ED and the hematoma was lanced but it did not drained since it was clogged. Patient complain of pain on right leg. Pain does not radiates.  Pain aggravated when the leg is touched. Pain improved with pain medications. Denies denies fever or chills.  Surgery is consulted by Dr. Martina Sinner in this context for evaluation and management of infected leg hematoma.  PAST MEDICAL HISTORY (PMH):  Past Medical History:  Diagnosis Date  . Alzheimer disease ([place])   . Anemia   . Anxiety   . Asthma   . Chronic back pain   . Depression   . Depression   . GERD (gastroesophageal reflux disease)   . Hiatal hernia   . Hypercholesteremia   . ITP (idiopathic thrombocytopenic purpura)   . Osteoarthritis   . Osteoporosis   . Pulmonary emboli ([place])   . Restless legs      PAST SURGICAL HISTORY [facility]):  Past Surgical History:  Procedure Laterality Date  . CARPAL TUNNEL RELEASE    . ESOPHAGOGASTRODUODENOSCOPY (EGD) WITH PROPOFOL N/A 09/10/2015   Procedure: ESOPHAGOGASTRODUODENOSCOPY (EGD) WITH PROPOFOL;  Surgeon: Lollie Sails, MD;  Location: [facility] ENDOSCOPY;  Service: Endoscopy;  Laterality: N/A;  . LUMBAR [place]    . SPLENECTOMY, PARTIAL    . TOTAL ABDOMINAL HYSTERECTOMY       MEDICATIONS:  Prior to Admission medications   Medication Sig Start Date End Date Taking? Authorizing Provider  acetaminophen (TYLENOL) 325 MG tablet Take 2 tablets (650 mg total) by mouth every 6 (six) hours as needed for mild pain (or Fever >/= 101). 07/02/15  Yes Idelle Crouch, MD  albuterol (PROVENTIL HFA;VENTOLIN HFA) 108 (90 BASE) MCG/ACT inhaler Inhale 2 puffs into the lungs every 6 (six) hours as needed for wheezing or shortness of breath.   Yes [provider]   ALPRAZolam Duanne Moron) 0.5 MG tablet Take 0.5 mg by mouth 3 (three) times daily as needed for anxiety.  03/11/17  Yes [provider]  buPROPion (WELLBUTRIN XL) 150 MG 24 hr tablet Take 150 mg by mouth daily.   Yes [provider]  Calcium Carbonate-Vitamin D (CALCIUM 600+D) 600-400 MG-UNIT per tablet Take 1 tablet by mouth daily.   Yes [provider]  citalopram (CELEXA) 40 MG tablet Take 40 mg by mouth daily.   Yes [provider]  Cyanocobalamin (RA VITAMIN B-12 TR) 1000 MCG TBCR Take 1,000 mcg by mouth daily.    Yes [provider]  donepezil (ARICEPT) 5 MG tablet Take 10 mg by mouth at bedtime.  10/18/14  Yes [provider]  doxycycline (VIBRAMYCIN) 100 MG capsule Take 100 mg by mouth 2 (two) times daily. 07/08/18 07/18/18 Yes [provider]  fluticasone (FLONASE) 50 MCG/ACT nasal spray Place 2 sprays into both nostrils daily.  03/03/17  Yes [provider]  Fluticasone-Salmeterol (ADVAIR) 250-50 MCG/DOSE AEPB Inhale 1 puff into the lungs daily as needed.    Yes [provider]  gabapentin (NEURONTIN) 300 MG capsule Take 300 mg by mouth 4 (four) times daily.   Yes [provider]  HYDROcodone-acetaminophen (NORCO) 7.5-325 MG tablet Take 1 tablet by mouth 3 (three) times daily as needed for moderate pain or severe pain.    Yes [provider]  levothyroxine (SYNTHROID, LEVOTHROID) 50 MCG tablet Take 50 mcg by mouth daily before breakfast. Take 30 to 60 minutes before breakfast.   Yes [provider]  memantine (NAMENDA) 5 MG tablet Take 5 mg by mouth 2 (two) times daily.  08/19/15 07/12/18 Yes [provider]  montelukast (SINGULAIR) 10 MG tablet Take 10 mg by mouth at bedtime.    Yes [provider]  Multiple Vitamins-Minerals (HAIR SKIN AND NAILS FORMULA PO) Take 1 tablet by mouth every evening.   Yes [provider]  Multiple Vitamins-Minerals (OCUVITE-LUTEIN PO) Take  1 capsule by mouth daily.    Yes [provider]  oxybutynin (DITROPAN) 5 MG tablet Take 5 mg by mouth 3 (three) times daily.   Yes [provider]  pantoprazole (PROTONIX) 20 MG tablet Take 20 mg by mouth 2 (two) times daily.  07/24/15  Yes [provider]  pravastatin (PRAVACHOL) 20 MG tablet Take 20 mg by mouth daily.   Yes [provider]  predniSONE (DELTASONE) 10 MG tablet Take 10 mg by mouth daily.  01/31/18  Yes [provider]  traMADol (ULTRAM) 50 MG tablet Take 50 mg by mouth 3 (three) times daily as needed for moderate pain or severe pain. pain   Yes [provider]  vitamin C (ASCORBIC ACID) 500 MG tablet Take 500 mg by mouth daily.   Yes [provider]  vitamin E (E-400) 400 UNIT capsule Take 400 Units by mouth daily.   Yes [provider]  cephALEXin (KEFLEX) 500 MG capsule Take 1 capsule (500 mg total) by mouth 3 (three) times daily for 7 days. 07/11/18 07/18/18  Merlyn Lot, MD  prochlorperazine (COMPAZINE) 10 MG tablet Take 1 tablet (10 mg total) by mouth every 6 (six) hours as needed for nausea or vomiting. Patient not taking: Reported on 06/20/2018 11/25/17   Lloyd Huger, MD     ALLERGIES:  Allergies  Allergen Reactions  . Aspirin     Other reaction(s): Distress (finding)  . Diazepam     Other reaction(s): Itching of Skin sneezing  . Morphine     Other reaction(s): Itching of Skin  . Other     Other reaction(s): Unknown seasonal allergies  . Tetanus Toxoids     Other reaction(s): Localized superficial swelling of skin     SOCIAL HISTORY:  Social History   Socioeconomic History  . Marital status: Married    Spouse name: Not on file  . Number of children: Not on file  . Years of education: Not on file  . Highest education level: Not on file  Occupational History  . Not on file  Social Needs  . Financial resource strain: Not on file  . Food insecurity:    Worry: Not on file     Inability: Not on file  . Transportation needs:    Medical: Not on file    Non-medical: Not on file  Tobacco Use  . Smoking status: Never Smoker  . Smokeless tobacco: Never Used  Substance and Sexual Activity  . Alcohol use: No    Alcohol/week: 0.0 standard drinks  . Drug use: No  . Sexual activity: Not on file  Lifestyle  . Physical activity:    Days per week: Not on file    Minutes per session: Not on file  . Stress: Not on file  Relationships  . Social connections:    Talks on phone: Not on file    Gets together: Not on file  Attends religious service: Not on file    Active member of club or organization: Not on file    Attends meetings of clubs or organizations: Not on file    Relationship status: Not on file  . Intimate partner violence:    Fear of current or ex partner: Not on file    Emotionally abused: Not on file    Physically abused: Not on file    Forced sexual activity: Not on file  Other Topics Concern  . Not on file  Social History Narrative  . Not on file    FAMILY HISTORY:  Family History  Problem Relation Age of Onset  . Stroke Mother      REVIEW OF SYSTEMS:  Constitutional: denies weight loss, fever, chills, or sweats  Eyes: denies any other vision changes, history of eye injury  ENT: denies sore throat, hearing problems  Respiratory: denies shortness of breath, wheezing  Cardiovascular: denies chest pain, palpitations  Gastrointestinal: denies abdominal pain, N/V, or diarrhea Genitourinary: denies burning with urination or urinary frequency Musculoskeletal: positive right leg pain.  Skin: denies any other rashes or skin discolorations. Positive for hematoma and bruises Neurological: denies any other headache, dizziness, weakness  Psychiatric: denies any other depression, anxiety   All other review of systems were negative   VITAL SIGNS:  Temp:  [97.9 F (36.6 C)-98.6 F (37 C)] 97.9 F (36.6 C) (10/01 1814) Pulse Rate:  [62-80] 62  (10/01 1814) Resp:  [16-18] 18 (10/01 1814) BP: (147-148)/(61-69) 147/61 (10/01 1814) SpO2:  [96 %-99 %] 99 % (10/01 1814)             INTAKE/OUTPUT:  This shift: No intake/output data recorded.  Last 2 shifts: @IOLAST2SHIFTS @   PHYSICAL EXAM:  Constitutional:  -- Normal body habitus  -- Awake, alert, and oriented x3  Eyes:  -- Pupils equally round and reactive to light  -- No scleral icterus  Ear, nose, and throat:  -- No jugular venous distension  Pulmonary:  -- No crackles  -- Equal breath sounds bilaterally -- Breathing non-labored at rest Cardiovascular:  -- S1, S2 present  -- No pericardial rubs Gastrointestinal:  -- Abdomen soft, nontender, non-distended, no guarding or rebound tenderness -- No abdominal masses appreciated, pulsatile or otherwise  Musculoskeletal and Integumentary:  -- Right leg large hematoma with medial aspect with purulent secretions. Cellulitis. Bilateral extremity bruising.  Neurologic:  -- Motor function: intact and symmetric -- Sensation: intact and symmetric   Labs:  CBC Latest Ref Rng & Units 07/12/2018 07/11/2018 06/20/2018  WBC 3.6 - 11.0 K/uL 16.4(H) 14.3(H) 10.7(H)  Hemoglobin 12.0 - 16.0 g/dL 10.6(L) 10.2(L) 11.2(L)  Hematocrit 35.0 - 47.0 % 32.9(L) 31.8(L) 35.5  Platelets 150 - 440 K/uL 149(L) 180 35(L)   CMP Latest Ref Rng & Units 07/12/2018 07/11/2018 10/04/2017  Glucose 70 - 99 mg/dL 75 83 82  BUN 8 - 23 mg/dL 21 25(H) 25(H)  Creatinine 0.44 - 1.00 mg/dL 0.91 1.02(H) 0.96  Sodium 135 - 145 mmol/L 139 143 140  Potassium 3.5 - 5.1 mmol/L 3.6 3.9 3.7  Chloride 98 - 111 mmol/L 105 108 107  CO2 22 - 32 mmol/L 26 28 28   Calcium 8.9 - 10.3 mg/dL 8.9 9.1 8.9  Total Protein 6.5 - 8.1 g/dL - - 6.7  Total Bilirubin 0.3 - 1.2 mg/dL - - 0.5  Alkaline Phos 38 - 126 U/L - - 74  AST 15 - 41 U/L - - 22  ALT 14 - 54 U/L - -  13(L)     Imaging studies:  EXAM: RIGHT TIBIA AND FIBULA - 2 VIEW  COMPARISON:  07/11/2018  FINDINGS: No  fracture or malalignment. Diffuse soft tissue edema without radiopaque foreign body or soft tissue emphysema. Protuberant soft tissue mid aspect of the lower leg. No periostitis or bone destruction  IMPRESSION: 1. No acute osseous abnormality 2. Diffuse soft tissue edema without radiopaque foreign body or soft tissue emphysema.   Electronically Signed   By: Donavan Foil M.D.   On: 07/12/2018 16:05  Assessment/Plan:  84 y.o. female with infected right leg hematoma, complicated by pertinent comorbidities including ITP, dementia, anemia, asthma, GERD, history of PE.  Patient with easy bruising and bleeding most likely due to ITP. Presently controlled with platelets on 149-180. There are sings of infection on the medial area of the hematoma. This was discussed with patient. My recommendations are to take patient to OR for cleansing and debridement of the hematoma and the infection. Patient at this moment refers that wants to discuss with husband. Will place patient NPO to discuss with husband in the morning. Benefits and risk discussed. If debrided will heal faster but with pain (will be like second degree burn wound). If not debrided may improve with antibiotic but will take longer if infection can be controlled. Patient with increased WBC count, cellulitis and significant pain. Will re evaluate in the morning.   Arnold Long, MD

## 2018-07-12 NOTE — ED Provider Notes (Signed)
[facility] Emergency Department Provider Note    First MD Initiated Contact with Patient 07/12/18 1229     (approximate)  I have reviewed the triage vital signs and the nursing notes.   HISTORY  Chief Complaint Wound Check    HPI Ariana [name] is a 84 y.o. female with a history of dementia seen yesterday by me for wound to right leg and fall.  Re-presented today as I instructed because she was unable to follow-up with wound care until 15 October and her primary physician was unable to see her either.  Is complaining of worsening pain.  Have not undressed the dressing since being seen yesterday.  No fevers at home.  Describes as achy pain.  Has noticed worsening bruising going up the posterior thigh.  Scribes it is moderate discomfort.    Past Medical History:  Diagnosis Date  . Alzheimer disease ([place])   . Anemia   . Anxiety   . Asthma   . Chronic back pain   . Depression   . Depression   . GERD (gastroesophageal reflux disease)   . Hiatal hernia   . Hypercholesteremia   . ITP (idiopathic thrombocytopenic purpura)   . Osteoarthritis   . Osteoporosis   . Pulmonary emboli ([place])   . Restless legs    Family History  Problem Relation Age of Onset  . Stroke Mother    Past Surgical History:  Procedure Laterality Date  . CARPAL TUNNEL RELEASE    . ESOPHAGOGASTRODUODENOSCOPY (EGD) WITH PROPOFOL N/A 09/10/2015   Procedure: ESOPHAGOGASTRODUODENOSCOPY (EGD) WITH PROPOFOL;  Surgeon: Lollie Sails, MD;  Location: [facility] ENDOSCOPY;  Service: Endoscopy;  Laterality: N/A;  . LUMBAR [place]    . SPLENECTOMY, PARTIAL    . TOTAL ABDOMINAL HYSTERECTOMY     Patient Active Problem List   Diagnosis Date Noted  . Traumatic hematoma of right lower leg 07/12/2018  . Asthma without status asthmaticus 11/25/2016  . Late onset Alzheimer's disease without behavioral disturbance ([place]) 12/30/2015  . Back muscle spasm 09/09/2015  . Clinical  depression 04/12/2015  . Herpes zona 04/12/2015  . Chronic ITP (idiopathic thrombocytopenic purpura) (H[facility]) 03/15/2015  . Chest pain 10/22/2014  . Breathlessness on exertion 10/22/2014  . Osteopenia 10/18/2014  . Benign essential HTN 06/28/2014  . HLD (hyperlipidemia) 06/28/2014  . Acquired hypothyroidism 06/04/2014  . Anxiety and depression 06/04/2014  . Bursitis, trochanteric 03/27/2014  . DDD (degenerative disc disease), lumbar 02/27/2014  . Neuritis or radiculitis due to rupture of lumbar intervertebral disc 02/27/2014      Prior to Admission medications   Medication Sig Start Date End Date Taking? Authorizing Provider  acetaminophen (TYLENOL) 325 MG tablet Take 2 tablets (650 mg total) by mouth every 6 (six) hours as needed for mild pain (or Fever >/= 101). 07/02/15  Yes Idelle Crouch, MD  albuterol (PROVENTIL HFA;VENTOLIN HFA) 108 (90 BASE) MCG/ACT inhaler Inhale 2 puffs into the lungs every 6 (six) hours as needed for wheezing or shortness of breath.   Yes [provider]  ALPRAZolam Duanne Moron) 0.5 MG tablet Take 0.5 mg by mouth 3 (three) times daily as needed for anxiety.  03/11/17  Yes [provider]  buPROPion (WELLBUTRIN XL) 150 MG 24 hr tablet Take 150 mg by mouth daily.   Yes [provider]  Calcium Carbonate-Vitamin D (CALCIUM 600+D) 600-400 MG-UNIT per tablet Take 1 tablet by mouth daily.   Yes [provider]  citalopram (CELEXA) 40 MG tablet Take 40  mg by mouth daily.   Yes [provider]  Cyanocobalamin (RA VITAMIN B-12 TR) 1000 MCG TBCR Take 1,000 mcg by mouth daily.    Yes [provider]  donepezil (ARICEPT) 5 MG tablet Take 10 mg by mouth at bedtime.  10/18/14  Yes [provider]  doxycycline (VIBRAMYCIN) 100 MG capsule Take 100 mg by mouth 2 (two) times daily. 07/08/18 07/18/18 Yes [provider]  fluticasone (FLONASE) 50 MCG/ACT nasal spray Place 2 sprays into both nostrils daily.  03/03/17  Yes  [provider]  Fluticasone-Salmeterol (ADVAIR) 250-50 MCG/DOSE AEPB Inhale 1 puff into the lungs daily as needed.    Yes [provider]  gabapentin (NEURONTIN) 300 MG capsule Take 300 mg by mouth 4 (four) times daily.   Yes [provider]  HYDROcodone-acetaminophen (NORCO) 7.5-325 MG tablet Take 1 tablet by mouth 3 (three) times daily as needed for moderate pain or severe pain.    Yes [provider]  levothyroxine (SYNTHROID, LEVOTHROID) 50 MCG tablet Take 50 mcg by mouth daily before breakfast. Take 30 to 60 minutes before breakfast.   Yes [provider]  memantine (NAMENDA) 5 MG tablet Take 5 mg by mouth 2 (two) times daily.  08/19/15 07/12/18 Yes [provider]  montelukast (SINGULAIR) 10 MG tablet Take 10 mg by mouth at bedtime.    Yes [provider]  Multiple Vitamins-Minerals (HAIR SKIN AND NAILS FORMULA PO) Take 1 tablet by mouth every evening.   Yes [provider]  Multiple Vitamins-Minerals (OCUVITE-LUTEIN PO) Take 1 capsule by mouth daily.    Yes [provider]  oxybutynin (DITROPAN) 5 MG tablet Take 5 mg by mouth 3 (three) times daily.   Yes [provider]  pantoprazole (PROTONIX) 20 MG tablet Take 20 mg by mouth 2 (two) times daily.  07/24/15  Yes [provider]  pravastatin (PRAVACHOL) 20 MG tablet Take 20 mg by mouth daily.   Yes [provider]  predniSONE (DELTASONE) 10 MG tablet Take 10 mg by mouth daily.  01/31/18  Yes [provider]  traMADol (ULTRAM) 50 MG tablet Take 50 mg by mouth 3 (three) times daily as needed for moderate pain or severe pain. pain   Yes [provider]  vitamin C (ASCORBIC ACID) 500 MG tablet Take 500 mg by mouth daily.   Yes [provider]  vitamin E (E-400) 400 UNIT capsule Take 400 Units by mouth daily.   Yes [provider]  cephALEXin (KEFLEX) 500 MG capsule Take 1 capsule (500 mg total) by mouth 3  (three) times daily for 7 days. 07/11/18 07/18/18  Merlyn Lot, MD  prochlorperazine (COMPAZINE) 10 MG tablet Take 1 tablet (10 mg total) by mouth every 6 (six) hours as needed for nausea or vomiting. Patient not taking: Reported on 06/20/2018 11/25/17   Lloyd Huger, MD    Allergies Aspirin; Diazepam; Morphine; Other; and Tetanus toxoids    Social History Social History   Tobacco Use  . Smoking status: Never Smoker  . Smokeless tobacco: Never Used  Substance Use Topics  . Alcohol use: No    Alcohol/week: 0.0 standard drinks  . Drug use: No    Review of Systems Patient denies headaches, rhinorrhea, blurry vision, numbness, shortness of breath, chest pain, edema, cough, abdominal pain, nausea, vomiting, diarrhea, dysuria, fevers, rashes or hallucinations unless otherwise stated above in HPI. ____________________________________________   PHYSICAL EXAM:  VITAL SIGNS: Vitals:   07/12/18 1620 07/12/18 1814  BP: Marland Kitchen)  148/69 (!) 147/61  Pulse: 69 62  Resp: 18 18  Temp:  97.9 F (36.6 C)  SpO2: 98% 99%    Constitutional: Alert. Eyes: Conjunctivae are normal.  Head: Atraumatic. Nose: No congestion/rhinnorhea. Mouth/Throat: Mucous membranes are moist.   Neck: No stridor. Painless ROM.  Cardiovascular: Normal rate, regular rhythm. Grossly normal heart sounds.  Good peripheral circulation. Respiratory: Normal respiratory effort.  No retractions. Lungs CTAB. Gastrointestinal: Soft and nontender. No distention. No abdominal bruits. No CVA tenderness. Genitourinary: deferred Musculoskeletal: Similar contusion as described in yesterday's note.  No surrounding erythema.  Muscle compartments of the calf are soft.  She has good distal cap refill with slightly more edema pitting to the foot likely secondary to compression wrap.  Does have more ecchymosis extending along the posterior thigh.  No crepitus.  The hematoma is more decompressed as compared to previous.  No joint  effusions. Neurologic:  Normal speech and language. No gross focal neurologic deficits are appreciated. No facial droop Skin:  Skin is warm, dry and intact. No rash noted. Psychiatric: Mood and affect are normal. Speech and behavior are normal.  ____________________________________________   LABS (all labs ordered are listed, but only abnormal results are displayed)  Results for orders placed or performed during the hospital encounter of 07/12/18 (from the past 24 hour(s))  CBC with Differential/Platelet     Status: Abnormal   Collection Time: 07/12/18 12:31 PM  Result Value Ref Range   WBC 16.4 (H) 3.6 - 11.0 K/uL   RBC 3.97 3.80 - 5.20 MIL/uL   Hemoglobin 10.6 (L) 12.0 - 16.0 g/dL   HCT 32.9 (L) 35.0 - 47.0 %   MCV 82.9 80.0 - 100.0 fL   MCH 26.7 26.0 - 34.0 pg   MCHC 32.2 32.0 - 36.0 g/dL   RDW 19.1 (H) 11.5 - 14.5 %   Platelets 149 (L) 150 - 440 K/uL   Neutrophils Relative % 64 %   Neutro Abs 10.5 (H) 1.4 - 6.5 K/uL   Lymphocytes Relative 25 %   Lymphs Abs 4.0 (H) 1.0 - 3.6 K/uL   Monocytes Relative 9 %   Monocytes Absolute 1.5 (H) 0.2 - 0.9 K/uL   Eosinophils Relative 1 %   Eosinophils Absolute 0.2 0 - 0.7 K/uL   Basophils Relative 1 %   Basophils Absolute 0.2 (H) 0 - 0.1 K/uL   RBC Morphology MIXED RBC POPULATION   Basic metabolic panel     Status: Abnormal   Collection Time: 07/12/18 12:31 PM  Result Value Ref Range   Sodium 139 135 - 145 mmol/L   Potassium 3.6 3.5 - 5.1 mmol/L   Chloride 105 98 - 111 mmol/L   CO2 26 22 - 32 mmol/L   Glucose, Bld 75 70 - 99 mg/dL   BUN 21 8 - 23 mg/dL   Creatinine, Ser 0.91 0.44 - 1.00 mg/dL   Calcium 8.9 8.9 - 10.3 mg/dL   GFR calc non Af Amer 57 (L) >60 mL/min   GFR calc Af Amer >60 >60 mL/min   Anion gap 8 5 - 15   ____________________________________________  ____________________________________________  RADIOLOGY  I personally reviewed all radiographic images ordered to evaluate for the above acute complaints and  reviewed radiology reports and findings.  These findings were personally discussed with the patient.  Please see medical record for radiology report.  ____________________________________________   PROCEDURES  Procedure(s) performed:  Procedures    Critical Care performed: no ____________________________________________   INITIAL IMPRESSION / ASSESSMENT AND  PLAN / ED COURSE  Pertinent labs & imaging results that were available during my care of the patient were reviewed by me and considered in my medical decision making (see chart for details).   DDX: contusion, wound, infected hematoma  [place] Severs is a 84 y.o. who presents to the ED with large hematoma as described and evaluated yesterday.  Patient returns with worsening pain.  Does not seem consistent with necrotizing soft tissue infection.  Possible infected hematoma.  Leukocytosis may be secondary to trauma and large hematoma that is being resorbed.  No purulence but does have new streaking redness posteriorly.  Obviously there are a lot of social factors involved as well family having difficulty caring for wound with frequent falls.  We will check blood work and reassess.  Clinical Course as of Jul 12 1828  Tue Jul 12, 2018  1444 Patient does have a rising white count with left shift.  Will give IV Rocephin.  At this point do believe patient may benefit from hospitalization for [facility] for cellulitis due to concern for infected hematoma and wound care.   [PR]  1540 Discussed case with Dr. Peyton Najjar who kindly agrees to evaluate patient to evaluate a formal I&D or excision of hematoma needed.    [PR]    Clinical Course User Index [PR] Merlyn Lot, MD     As part of my medical decision making, I reviewed the following data within the [place] notes reviewed and incorporated, Labs reviewed, notes from prior ED visits.   ____________________________________________   FINAL  CLINICAL IMPRESSION(S) / ED DIAGNOSES  Final diagnoses:  Wound infection  Hematoma      NEW MEDICATIONS STARTED DURING THIS VISIT:  Current Discharge Medication List       Note:  This document was prepared using Dragon voice recognition software and may include unintentional dictation errors.    Merlyn Lot, MD 07/12/18 [phone]

## 2018-07-12 NOTE — Clinical Social Work Note (Signed)
CSW received consult that patient may need snf placement.  CSW requested physician to order PT evaluation for patient due to patient needing to have insurance authorization before she is able to go to SNF.  CSW awaiting PT orders and recommendations.  [name] Broom. Worden, MSW, [place]  07/12/2018 2:35 PM

## 2018-07-12 NOTE — H&P (Signed)
[place] [facility] [place] NAME: Ariana [name]    MR#:  [id number]  DATE OF BIRTH:  Jan 16, 1933  DATE OF ADMISSION:  07/12/2018  PRIMARY CARE PHYSICIAN: Idelle Crouch, MD   REQUESTING/REFERRING PHYSICIAN: Dr. Quentin Cornwall  CHIEF COMPLAINT:   Chief Complaint  Patient presents with  . Wound Check    HISTORY OF PRESENT ILLNESS:  Ariana [name]  is a 84 y.o. female with a known history of dementia, ITP, chronic anemia presents to the hospital with second visit in 2 days due to right leg pain.  Patient had a fall yesterday and presented to the emergency room.  Was found to have right leg hematoma which was drained.  Discharged home.  Today she came back for recheck as her pain has worsened no improvement seen.  Here her white count was elevated at 16,000 and patient is being admitted for right leg pain control and possible cellulitis. Afebrile. Patient has chronic discoloration of bilateral legs due to history of bruising from ITP. Significant difficulty ambulating due to pain.  Imaging has shown no fractures.  PAST MEDICAL HISTORY:   Past Medical History:  Diagnosis Date  . Alzheimer disease ([place])   . Anemia   . Anxiety   . Asthma   . Chronic back pain   . Depression   . Depression   . GERD (gastroesophageal reflux disease)   . Hiatal hernia   . Hypercholesteremia   . ITP (idiopathic thrombocytopenic purpura)   . Osteoarthritis   . Osteoporosis   . Pulmonary emboli ([place])   . Restless legs     PAST SURGICAL HISTORY:   Past Surgical History:  Procedure Laterality Date  . CARPAL TUNNEL RELEASE    . ESOPHAGOGASTRODUODENOSCOPY (EGD) WITH PROPOFOL N/A 09/10/2015   Procedure: ESOPHAGOGASTRODUODENOSCOPY (EGD) WITH PROPOFOL;  Surgeon: Lollie Sails, MD;  Location: [facility] ENDOSCOPY;  Service: Endoscopy;  Laterality: N/A;  . LUMBAR [place]    . SPLENECTOMY, PARTIAL    . TOTAL ABDOMINAL HYSTERECTOMY      SOCIAL HISTORY:    Social History   Tobacco Use  . Smoking status: Never Smoker  . Smokeless tobacco: Never Used  Substance Use Topics  . Alcohol use: No    Alcohol/week: 0.0 standard drinks    FAMILY HISTORY:   Family History  Problem Relation Age of Onset  . Stroke Mother     DRUG ALLERGIES:   Allergies  Allergen Reactions  . Aspirin     Other reaction(s): Distress (finding)  . Diazepam     Other reaction(s): Itching of Skin sneezing  . Morphine     Other reaction(s): Itching of Skin  . Other     Other reaction(s): Unknown seasonal allergies  . Tetanus Toxoids     Other reaction(s): Localized superficial swelling of skin    REVIEW OF SYSTEMS:   Review of Systems  Constitutional: Positive for malaise/fatigue. Negative for chills and fever.  HENT: Negative for sore throat.   Eyes: Negative for blurred vision, double vision and pain.  Respiratory: Negative for cough, hemoptysis, shortness of breath and wheezing.   Cardiovascular: Negative for chest pain, palpitations, orthopnea and leg swelling.  Gastrointestinal: Negative for abdominal pain, constipation, diarrhea, heartburn, nausea and vomiting.  Genitourinary: Negative for dysuria and hematuria.  Musculoskeletal: Positive for falls and joint pain. Negative for back pain.  Skin: Negative for rash.  Neurological: Negative for sensory change, speech change, focal weakness and headaches.  Endo/Heme/Allergies: Does not bruise/bleed  easily.  Psychiatric/Behavioral: Positive for memory loss. Negative for depression. The patient is not nervous/anxious.     MEDICATIONS AT HOME:   Prior to Admission medications   Medication Sig Start Date End Date Taking? Authorizing Provider  acetaminophen (TYLENOL) 325 MG tablet Take 2 tablets (650 mg total) by mouth every 6 (six) hours as needed for mild pain (or Fever >/= 101). 07/02/15   Idelle Crouch, MD  albuterol (PROVENTIL HFA;VENTOLIN HFA) 108 (90 BASE) MCG/ACT inhaler Inhale 2 puffs  into the lungs every 6 (six) hours as needed for wheezing or shortness of breath.    [provider]  ALPRAZolam Duanne Moron) 0.5 MG tablet Take 0.5 mg by mouth 3 (three) times daily as needed for anxiety.  03/11/17   [provider]  buPROPion (WELLBUTRIN XL) 150 MG 24 hr tablet Take 150 mg by mouth daily.    [provider]  Calcium Carbonate-Vitamin D (CALCIUM 600+D) 600-400 MG-UNIT per tablet Take 1 tablet by mouth daily.    [provider]  cephALEXin (KEFLEX) 500 MG capsule Take 1 capsule (500 mg total) by mouth 3 (three) times daily for 7 days. 07/11/18 07/18/18  Merlyn Lot, MD  citalopram (CELEXA) 40 MG tablet Take 40 mg by mouth daily.    [provider]  Cyanocobalamin (RA VITAMIN B-12 TR) 1000 MCG TBCR Take 1,000 mcg by mouth daily.     [provider]  donepezil (ARICEPT) 5 MG tablet Take 10 mg by mouth at bedtime.  10/18/14   [provider]  fluticasone (FLONASE) 50 MCG/ACT nasal spray Place into the nose. 03/03/17   [provider]  Fluticasone-Salmeterol (ADVAIR) 250-50 MCG/DOSE AEPB Inhale 1 puff into the lungs daily as needed.     [provider]  gabapentin (NEURONTIN) 300 MG capsule Take 300 mg by mouth 4 (four) times daily.    [provider]  HYDROcodone-acetaminophen (NORCO) 7.5-325 MG tablet 1 tablet by mouth q 6 h prn 11/14/16   [provider]  levothyroxine (SYNTHROID, LEVOTHROID) 50 MCG tablet Take 50 mcg by mouth daily before breakfast. Take 30 to 60 minutes before breakfast.    [provider]  memantine (NAMENDA) 5 MG tablet Take 5 mg by mouth 2 (two) times daily.  08/19/15 03/21/18  [provider]  montelukast (SINGULAIR) 10 MG tablet Take by mouth. 03/16/17 03/21/18  [provider]  Multiple Vitamins-Minerals (HAIR SKIN AND NAILS FORMULA PO) Take 1 tablet by mouth every evening.    [provider]  Multiple Vitamins-Minerals (OCUVITE-LUTEIN PO)  Take 1 capsule by mouth daily.     [provider]  oxybutynin (DITROPAN) 5 MG tablet Take 5 mg by mouth 3 (three) times daily.    [provider]  pantoprazole (PROTONIX) 20 MG tablet Take 20 mg by mouth 2 (two) times daily.  07/24/15   [provider]  pravastatin (PRAVACHOL) 20 MG tablet Take 20 mg by mouth daily.    [provider]  predniSONE (DELTASONE) 10 MG tablet  01/31/18   [provider]  prochlorperazine (COMPAZINE) 10 MG tablet Take 1 tablet (10 mg total) by mouth every 6 (six) hours as needed for nausea or vomiting. Patient not taking: Reported on 06/20/2018 11/25/17   Lloyd Huger, MD  traMADol (ULTRAM) 50 MG tablet Take 50 mg by mouth 3 (three) times daily as needed for moderate pain or severe pain. pain    [provider]  vitamin C (ASCORBIC ACID) 500 MG tablet Take  500 mg by mouth daily.    [provider]  vitamin E (E-400) 400 UNIT capsule Take 400 Units by mouth daily.    [provider]     VITAL SIGNS:  Pulse 80, temperature 98.6 F (37 C), temperature source Oral, resp. rate 16, SpO2 96 %.  PHYSICAL EXAMINATION:  Physical Exam  GENERAL:  84 y.o.-year-old patient lying in the bed.  In distress due to pain EYES: Pupils equal, round, reactive to light and accommodation. No scleral icterus. Extraocular muscles intact.  HEENT: Head atraumatic, normocephalic. Oropharynx and nasopharynx clear. No oropharyngeal erythema, moist oral mucosa  NECK:  Supple, no jugular venous distention. No thyroid enlargement, no tenderness.  LUNGS: Normal breath sounds bilaterally, no wheezing, rales, rhonchi. No use of accessory muscles of respiration.  CARDIOVASCULAR: S1, S2 normal. No murmurs, rubs, or gallops.  ABDOMEN: Soft, nontender, nondistended. Bowel sounds present. No organomegaly or mass.  EXTREMITIES: No pedal edema.. + 2 pedal & radial pulses b/l.    Right leg hematoma 12 x 4 cm.  No acute bleeding.   Chronic discoloration bilateral legs. NEUROLOGIC: Cranial nerves II through XII are intact. No focal Motor or sensory deficits appreciated b/l PSYCHIATRIC: The patient is alert and oriented x 3. Good affect.  SKIN: No obvious rash, lesion, or ulcer.   LABORATORY PANEL:   CBC Recent Labs  Lab 07/12/18 1231  WBC 16.4*  HGB 10.6*  HCT 32.9*  PLT 149*   ------------------------------------------------------------------------------------------------------------------  Chemistries  Recent Labs  Lab 07/12/18 1231  NA 139  K 3.6  CL 105  CO2 26  GLUCOSE 75  BUN 21  CREATININE 0.91  CALCIUM 8.9   ------------------------------------------------------------------------------------------------------------------  Cardiac Enzymes No results for input(s): TROPONINI in the last 168 hours. ------------------------------------------------------------------------------------------------------------------  RADIOLOGY:  Dg Tibia/fibula Right  Result Date: 07/12/2018 CLINICAL DATA:  Right leg pain with swelling EXAM: RIGHT TIBIA AND FIBULA - 2 VIEW COMPARISON:  07/11/2018 FINDINGS: No fracture or malalignment. Diffuse soft tissue edema without radiopaque foreign body or soft tissue emphysema. Protuberant soft tissue mid aspect of the lower leg. No periostitis or bone destruction IMPRESSION: 1. No acute osseous abnormality 2. Diffuse soft tissue edema without radiopaque foreign body or soft tissue emphysema. Electronically Signed   By: Donavan Foil M.D.   On: 07/12/2018 16:05   Dg Tibia/fibula Right  Result Date: 07/11/2018 CLINICAL DATA:  Right lower leg pain following a fall last night. EXAM: RIGHT TIBIA AND FIBULA - 2 VIEW COMPARISON:  None. FINDINGS: Patellofemoral joint space narrowing and mild spur formation. Mild lateral knee spur formation. No fracture or dislocation. IMPRESSION: 1. No fracture or dislocation. 2. Knee degenerative changes. Electronically Signed   By: Claudie Revering M.D.    On: 07/11/2018 17:55   Ct Head Wo Contrast  Result Date: 07/11/2018 CLINICAL DATA:  84 year old female with Alzheimer's disease and low platelets. Recent falls. Initial encounter. EXAM: CT HEAD WITHOUT CONTRAST TECHNIQUE: Contiguous axial images were obtained from the base of the skull through the vertex without intravenous contrast. COMPARISON:  03/23/2017. FINDINGS: Brain: No intracranial hemorrhage or CT evidence of large acute infarct. Chronic microvascular changes. Global atrophy. No intracranial mass lesion noted on this unenhanced exam. Vascular: Vascular calcifications. Skull: No skull fracture. Sinuses/Orbits: No acute orbital abnormality. Visualized paranasal sinuses clear. Other: Mastoid air cells and middle ear cavities clear. IMPRESSION: 1. No skull fracture or intracranial hemorrhage. 2. Chronic microvascular changes. 3. Global atrophy. Electronically Signed   By: Genia Del M.D.   On: 07/11/2018  17:40     IMPRESSION AND PLAN:   *Right leg hematoma with intractable pain.  This was drained yesterday.  No fractures found. Pain controll with oral and IV antibiotics. I do not think there is cellulitis at this time.  Monitor.  *History of ITP.  Platelets close to normal.  1 49,000.  Monitor.  *Dementia.  Monitor for inpatient delirium.  *Anemia of chronic disease.  Stable.  *Fall and weakness.  Will have physical therapy evaluate patient in the morning.  All the records are reviewed and case discussed with ED provider. Management plans discussed with the patient, family and they are in agreement.  CODE STATUS: FULL CODE  TOTAL TIME TAKING CARE OF THIS PATIENT: 35 minutes.   Leia Alf Doylene Splinter M.D on 07/12/2018 at 4:13 PM  Between 7am to 6pm - Pager - [phone]  After 6pm go to [email or web address] - password [email or web address] [facility]  Office  [phone]  CC: Primary care physician; Idelle Crouch, MD  Note: This dictation was prepared with Dragon  dictation along with smaller phrase technology. Any transcriptional errors that result from this process are unintentional.

## 2018-07-12 NOTE — ED Notes (Signed)
Message sent to pharmacy for missing bacitracin

## 2018-07-12 NOTE — Progress Notes (Signed)
Advance care planning  Purpose of Encounter Discussed regarding patient's right leg hematoma/admission and CODE STATUS discussion  Parties in Attendance Patient.  Hable,Raymond-Husband at bedside  Patients Decisional capacity Patient with dementia and unable to make decisions.  Discussed regarding patient's right leg hematoma and need for admission for monitoring and pain control.  All questions answered.  Discussed with husband.  Patient does not have advanced directives in place.  Husband thought she might want to be DO NOT RESUSCITATE.  He discussed with the patient and she wants to be a full code.  Husband agrees.  Orders entered.  Time spent - 20 minutes

## 2018-07-12 NOTE — ED Triage Notes (Signed)
Pt to ED via POV, was seen yesterday for wound to RT leg, was told by EDP to follow up with wound care or PCP today and if unable to come back to ED. Pt appears uncomfortable and fatigue. VSS .

## 2018-07-12 NOTE — ED Notes (Signed)
Quentin Cornwall MD to bedside with update

## 2018-07-13 ENCOUNTER — Encounter
Admission: EM | Disposition: A | Discharge status: Skilled Nursing Facility | Payer: Self-pay | Source: Home / Self Care | Attending: Internal Medicine

## 2018-07-13 ENCOUNTER — Observation Stay: Payer: Medicare HMO | Admitting: Certified Registered Nurse Anesthetist

## 2018-07-13 DIAGNOSIS — W19XXXD Unspecified fall, subsequent encounter: Secondary | ICD-10-CM | POA: Diagnosis not present

## 2018-07-13 DIAGNOSIS — Z7401 Bed confinement status: Secondary | ICD-10-CM | POA: Diagnosis not present

## 2018-07-13 DIAGNOSIS — R52 Pain, unspecified: Secondary | ICD-10-CM | POA: Diagnosis not present

## 2018-07-13 DIAGNOSIS — K449 Diaphragmatic hernia without obstruction or gangrene: Secondary | ICD-10-CM | POA: Diagnosis present

## 2018-07-13 DIAGNOSIS — D693 Immune thrombocytopenic purpura: Secondary | ICD-10-CM | POA: Diagnosis not present

## 2018-07-13 DIAGNOSIS — G2581 Restless legs syndrome: Secondary | ICD-10-CM | POA: Diagnosis present

## 2018-07-13 DIAGNOSIS — M810 Age-related osteoporosis without current pathological fracture: Secondary | ICD-10-CM | POA: Diagnosis present

## 2018-07-13 DIAGNOSIS — G309 Alzheimer's disease, unspecified: Secondary | ICD-10-CM | POA: Diagnosis not present

## 2018-07-13 DIAGNOSIS — W19XXXA Unspecified fall, initial encounter: Secondary | ICD-10-CM | POA: Diagnosis present

## 2018-07-13 DIAGNOSIS — F0280 Dementia in other diseases classified elsewhere without behavioral disturbance: Secondary | ICD-10-CM | POA: Diagnosis not present

## 2018-07-13 DIAGNOSIS — M549 Dorsalgia, unspecified: Secondary | ICD-10-CM | POA: Diagnosis present

## 2018-07-13 DIAGNOSIS — S8011XA Contusion of right lower leg, initial encounter: Secondary | ICD-10-CM | POA: Diagnosis not present

## 2018-07-13 DIAGNOSIS — D638 Anemia in other chronic diseases classified elsewhere: Secondary | ICD-10-CM | POA: Diagnosis not present

## 2018-07-13 DIAGNOSIS — T148XXA Other injury of unspecified body region, initial encounter: Secondary | ICD-10-CM | POA: Diagnosis present

## 2018-07-13 DIAGNOSIS — F419 Anxiety disorder, unspecified: Secondary | ICD-10-CM | POA: Diagnosis present

## 2018-07-13 DIAGNOSIS — G8929 Other chronic pain: Secondary | ICD-10-CM | POA: Diagnosis present

## 2018-07-13 DIAGNOSIS — Z86711 Personal history of pulmonary embolism: Secondary | ICD-10-CM | POA: Diagnosis not present

## 2018-07-13 DIAGNOSIS — R402254 Coma scale, best verbal response, oriented, 24 hours or more after hospital admission: Secondary | ICD-10-CM | POA: Diagnosis not present

## 2018-07-13 DIAGNOSIS — Z886 Allergy status to analgesic agent status: Secondary | ICD-10-CM | POA: Diagnosis not present

## 2018-07-13 DIAGNOSIS — M6281 Muscle weakness (generalized): Secondary | ICD-10-CM | POA: Diagnosis not present

## 2018-07-13 DIAGNOSIS — L03115 Cellulitis of right lower limb: Secondary | ICD-10-CM | POA: Diagnosis not present

## 2018-07-13 DIAGNOSIS — F0390 Unspecified dementia without behavioral disturbance: Secondary | ICD-10-CM | POA: Diagnosis not present

## 2018-07-13 DIAGNOSIS — R402144 Coma scale, eyes open, spontaneous, 24 hours or more after hospital admission: Secondary | ICD-10-CM | POA: Diagnosis not present

## 2018-07-13 DIAGNOSIS — E039 Hypothyroidism, unspecified: Secondary | ICD-10-CM | POA: Diagnosis not present

## 2018-07-13 DIAGNOSIS — K219 Gastro-esophageal reflux disease without esophagitis: Secondary | ICD-10-CM | POA: Diagnosis not present

## 2018-07-13 DIAGNOSIS — R296 Repeated falls: Secondary | ICD-10-CM | POA: Diagnosis present

## 2018-07-13 DIAGNOSIS — J45909 Unspecified asthma, uncomplicated: Secondary | ICD-10-CM | POA: Diagnosis not present

## 2018-07-13 DIAGNOSIS — M1990 Unspecified osteoarthritis, unspecified site: Secondary | ICD-10-CM | POA: Diagnosis present

## 2018-07-13 DIAGNOSIS — E538 Deficiency of other specified B group vitamins: Secondary | ICD-10-CM | POA: Diagnosis not present

## 2018-07-13 DIAGNOSIS — D649 Anemia, unspecified: Secondary | ICD-10-CM | POA: Diagnosis not present

## 2018-07-13 DIAGNOSIS — R0902 Hypoxemia: Secondary | ICD-10-CM | POA: Diagnosis not present

## 2018-07-13 DIAGNOSIS — F329 Major depressive disorder, single episode, unspecified: Secondary | ICD-10-CM | POA: Diagnosis not present

## 2018-07-13 DIAGNOSIS — L089 Local infection of the skin and subcutaneous tissue, unspecified: Secondary | ICD-10-CM | POA: Diagnosis not present

## 2018-07-13 DIAGNOSIS — S8011XD Contusion of right lower leg, subsequent encounter: Secondary | ICD-10-CM | POA: Diagnosis not present

## 2018-07-13 DIAGNOSIS — R402364 Coma scale, best motor response, obeys commands, 24 hours or more after hospital admission: Secondary | ICD-10-CM | POA: Diagnosis not present

## 2018-07-13 DIAGNOSIS — M7981 Nontraumatic hematoma of soft tissue: Secondary | ICD-10-CM | POA: Diagnosis not present

## 2018-07-13 HISTORY — PX: IRRIGATION AND DEBRIDEMENT HEMATOMA: SHX5254

## 2018-07-13 LAB — BASIC METABOLIC PANEL
Anion gap: 6 (ref 5–15)
BUN: 19 mg/dL (ref 8–23)
CALCIUM: 8.6 mg/dL — AB (ref 8.9–10.3)
CO2: 28 mmol/L (ref 22–32)
Chloride: 106 mmol/L (ref 98–111)
Creatinine, Ser: 0.93 mg/dL (ref 0.44–1.00)
GFR calc non Af Amer: 56 mL/min — ABNORMAL LOW (ref >60)
Glucose, Bld: 73 mg/dL (ref 70–99)
Potassium: 3.7 mmol/L (ref 3.5–5.1)
Sodium: 140 mmol/L (ref 135–145)

## 2018-07-13 LAB — CBC
HCT: 29.2 % — ABNORMAL LOW (ref 35.0–47.0)
Hemoglobin: 9.4 g/dL — ABNORMAL LOW (ref 12.0–16.0)
MCH: 26.4 pg (ref 26.0–34.0)
MCHC: 32.3 g/dL (ref 32.0–36.0)
MCV: 81.6 fL (ref 80.0–100.0)
Platelets: 129 10*3/uL — ABNORMAL LOW (ref 150–440)
RBC: 3.58 MIL/uL — ABNORMAL LOW (ref 3.80–5.20)
RDW: 19.4 % — AB (ref 11.5–14.5)
WBC: 12.1 10*3/uL — AB (ref 3.6–11.0)

## 2018-07-13 SURGERY — IRRIGATION AND DEBRIDEMENT HEMATOMA
Anesthesia: General | Site: Leg Lower | Laterality: Right

## 2018-07-13 MED ORDER — ENOXAPARIN SODIUM 40 MG/0.4ML ~~LOC~~ SOLN
40.0000 mg | SUBCUTANEOUS | Status: DC
  Administered 2018-07-14 – 2018-07-15 (×2): 40 mg via SUBCUTANEOUS
  Filled 2018-07-13 (×2): qty 0.4

## 2018-07-13 MED ORDER — LACTATED RINGERS IV SOLN
INTRAVENOUS | Status: DC
  Administered 2018-07-13 – 2018-07-15 (×4): via INTRAVENOUS

## 2018-07-13 MED ORDER — LIDOCAINE HCL (PF) 2 % IJ SOLN
INTRAMUSCULAR | Status: AC
  Filled 2018-07-13: qty 10

## 2018-07-13 MED ORDER — FENTANYL CITRATE (PF) 100 MCG/2ML IJ SOLN
INTRAMUSCULAR | Status: AC
  Filled 2018-07-13: qty 2

## 2018-07-13 MED ORDER — FENTANYL CITRATE (PF) 100 MCG/2ML IJ SOLN
25.0000 ug | INTRAMUSCULAR | Status: DC | PRN
  Administered 2018-07-13 (×3): 50 ug via INTRAVENOUS

## 2018-07-13 MED ORDER — OXYCODONE HCL 5 MG PO TABS: 5.0000 mg | ORAL_TABLET | Freq: Once | ORAL | Status: DC | PRN

## 2018-07-13 MED ORDER — FENTANYL CITRATE (PF) 100 MCG/2ML IJ SOLN
INTRAMUSCULAR | Status: AC
  Administered 2018-07-13: 18:00:00
  Filled 2018-07-13: qty 2

## 2018-07-13 MED ORDER — FENTANYL CITRATE (PF) 100 MCG/2ML IJ SOLN
INTRAMUSCULAR | Status: DC | PRN
  Administered 2018-07-13: 25 ug via INTRAVENOUS

## 2018-07-13 MED ORDER — SODIUM CHLORIDE FLUSH 0.9 % IV SOLN
INTRAVENOUS | Status: AC
  Administered 2018-07-13: 18:00:00
  Filled 2018-07-13: qty 10

## 2018-07-13 MED ORDER — ONDANSETRON HCL 4 MG/2ML IJ SOLN
INTRAMUSCULAR | Status: AC
  Filled 2018-07-13: qty 2

## 2018-07-13 MED ORDER — EPHEDRINE SULFATE 50 MG/ML IJ SOLN
INTRAMUSCULAR | Status: DC | PRN
  Administered 2018-07-13: 10 mg via INTRAVENOUS
  Administered 2018-07-13 (×2): 5 mg via INTRAVENOUS

## 2018-07-13 MED ORDER — ENOXAPARIN SODIUM 30 MG/0.3ML ~~LOC~~ SOLN: 30.0000 mg | SUBCUTANEOUS | Status: DC

## 2018-07-13 MED ORDER — DEXAMETHASONE SODIUM PHOSPHATE 10 MG/ML IJ SOLN
INTRAMUSCULAR | Status: DC | PRN
  Administered 2018-07-13: 5 mg via INTRAVENOUS

## 2018-07-13 MED ORDER — LIDOCAINE HCL (CARDIAC) PF 100 MG/5ML IV SOSY
PREFILLED_SYRINGE | INTRAVENOUS | Status: DC | PRN
  Administered 2018-07-13: 60 mg via INTRAVENOUS

## 2018-07-13 MED ORDER — MEPERIDINE HCL 50 MG/ML IJ SOLN: 6.2500 mg | INTRAMUSCULAR | Status: DC | PRN

## 2018-07-13 MED ORDER — CEFAZOLIN SODIUM-DEXTROSE 1-4 GM/50ML-% IV SOLN
1.0000 g | Freq: Three times a day (TID) | INTRAVENOUS | Status: DC
  Administered 2018-07-13 – 2018-07-15 (×6): 1 g via INTRAVENOUS
  Filled 2018-07-13 (×8): qty 50

## 2018-07-13 MED ORDER — SILVER SULFADIAZINE 1 % EX CREA
TOPICAL_CREAM | CUTANEOUS | Status: DC | PRN
  Administered 2018-07-13: 1 via TOPICAL

## 2018-07-13 MED ORDER — PROMETHAZINE HCL 25 MG/ML IJ SOLN
6.2500 mg | INTRAMUSCULAR | Status: DC | PRN
  Administered 2018-07-13: 6.25 mg via INTRAVENOUS

## 2018-07-13 MED ORDER — PROMETHAZINE HCL 25 MG/ML IJ SOLN
INTRAMUSCULAR | Status: AC
  Administered 2018-07-13: 6.25 mg via INTRAVENOUS
  Filled 2018-07-13: qty 1

## 2018-07-13 MED ORDER — OXYCODONE HCL 5 MG/5ML PO SOLN: 5.0000 mg | Freq: Once | ORAL | Status: DC | PRN

## 2018-07-13 MED ORDER — PANTOPRAZOLE SODIUM 40 MG PO TBEC
40.0000 mg | DELAYED_RELEASE_TABLET | Freq: Two times a day (BID) | ORAL | Status: DC
  Administered 2018-07-13 – 2018-07-15 (×4): 40 mg via ORAL
  Filled 2018-07-13 (×4): qty 1

## 2018-07-13 MED ORDER — PROPOFOL 10 MG/ML IV BOLUS
INTRAVENOUS | Status: DC | PRN
  Administered 2018-07-13: 110 mg via INTRAVENOUS

## 2018-07-13 MED ORDER — SILVER SULFADIAZINE 1 % EX CREA
TOPICAL_CREAM | CUTANEOUS | Status: AC
  Filled 2018-07-13: qty 85

## 2018-07-13 MED ORDER — ONDANSETRON HCL 4 MG/2ML IJ SOLN
INTRAMUSCULAR | Status: DC | PRN
  Administered 2018-07-13: 4 mg via INTRAVENOUS

## 2018-07-13 SURGICAL SUPPLY — 30 items
BLADE CLIPPER SURG (BLADE) ×1 IMPLANT
BLADE SURG 15 STRL LF DISP TIS (BLADE) ×1 IMPLANT
BLADE SURG 15 STRL SS (BLADE) ×3
BRUSH SCRUB EZ  4% CHG (MISCELLANEOUS) ×2
BRUSH SCRUB EZ 4% CHG (MISCELLANEOUS) ×1 IMPLANT
CANISTER SUCT 3000ML PPV (MISCELLANEOUS) ×3 IMPLANT
CNTNR SPEC 2.5X3XGRAD LEK (MISCELLANEOUS) ×1
CONT SPEC 4OZ STER OR WHT (MISCELLANEOUS) ×2
CONT SPEC 4OZ STRL OR WHT (MISCELLANEOUS) ×1
CONTAINER SPEC 2.5X3XGRAD LEK (MISCELLANEOUS) IMPLANT
DRAPE CHEST BREAST 77X106 FENE (MISCELLANEOUS) IMPLANT
DRAPE LAPAROTOMY 77X122 PED (DRAPES) ×3 IMPLANT
ELECT REM PT RETURN 9FT ADLT (ELECTROSURGICAL) ×3
ELECTRODE REM PT RTRN 9FT ADLT (ELECTROSURGICAL) ×1 IMPLANT
GAUZE PETRO 3X18 (MISCELLANEOUS) ×2 IMPLANT
GAUZE SPONGE 4X4 12PLY STRL (GAUZE/BANDAGES/DRESSINGS) ×2 IMPLANT
GLOVE BIO SURGEON STRL SZ 6.5 (GLOVE) ×2 IMPLANT
GLOVE BIO SURGEONS STRL SZ 6.5 (GLOVE) ×1
GLOVE BIOGEL PI IND STRL 6.5 (GLOVE) ×1 IMPLANT
GLOVE BIOGEL PI INDICATOR 6.5 (GLOVE) ×2
GOWN STRL REUS W/ TWL LRG LVL3 (GOWN DISPOSABLE) ×2 IMPLANT
GOWN STRL REUS W/TWL LRG LVL3 (GOWN DISPOSABLE) ×6
NEEDLE HYPO 22GX1.5 SAFETY (NEEDLE) ×3 IMPLANT
NS IRRIG 1000ML POUR BTL (IV SOLUTION) ×3 IMPLANT
PACK BASIN MINOR ARMC (MISCELLANEOUS) ×3 IMPLANT
PAD ABD DERMACEA PRESS 5X9 (GAUZE/BANDAGES/DRESSINGS) ×2 IMPLANT
SCRUB POVIDONE IODINE 4 OZ (MISCELLANEOUS) ×1 IMPLANT
SOL PREP PVP 2OZ (MISCELLANEOUS) ×3
SOLUTION PREP PVP 2OZ (MISCELLANEOUS) ×1 IMPLANT
SPONGE LAP 18X18 RF (DISPOSABLE) ×3 IMPLANT

## 2018-07-13 NOTE — Interval H&P Note (Signed)
History and Physical Interval Note:  07/13/2018 11:27 AM  Ariana [name]  has presented today for surgery, with the diagnosis of HEMATOMA  The various methods of treatment have been discussed with the patient and family. After consideration of risks, benefits and other options for treatment, the patient has consented to  Procedure(s): IRRIGATION AND DEBRIDEMENT HEMATOMA-RIGHT SHIN (Right) as a surgical intervention .  The patient's history has been reviewed, patient examined, no change in status, stable for surgery.  I have reviewed the patient's chart and labs.  Right leg marked in the pre procedure room. Questions were answered to the patient's satisfaction.     Herbert Pun

## 2018-07-13 NOTE — Clinical Social Work Note (Signed)
Clinical Social Work Assessment  Patient Details  Name: Ariana [name] MRN: [id number] Date of Birth: May 16, 1933  Date of referral:  07/13/18               Reason for consult:  Facility Placement                Permission sought to share information with:  Chartered certified accountant granted to share information::  Yes, Verbal Permission Granted  Name::      [place]::   [facility]   Relationship::     Contact Information:     Housing/Transportation Living arrangements for the past 2 months:  [place] of Information:  Patient, Spouse Patient Interpreter Needed:  None Criminal Activity/Legal Involvement Pertinent to Current Situation/Hospitalization:  No - Comment as needed Significant Relationships:  Adult Children Lives with:  Self Do you feel safe going back to the place where you live?  Yes Need for family participation in patient care:  Yes (Comment)  Care giving concerns:  Patient lives in [place] with her husband Ariana [name].    Social Worker assessment / plan:  Holiday representative ([place]) received verbal consult from RN in progression rounds this morning that patient had a hard time moving her leg and may need SNF. Patient had surgery today and PT is pending. CSW met with patient and her husband Ariana [name] was at bedside. Patient had just arrived back to her room from surgery and did not participate in assessment. Per husband they live in [place] and patient's son Ariana [name] is patient's financial POA. CSW explained that PT will evaluate patient and make a recommendation of SNF or home health. Per husband patient will likely need SNF. CSW explained that patient's insurance Mcarthur Rossetti will have to approve SNF. Husband verbalized his understanding and is agreeable to SNF search in [place]. FL2 complete and faxed out. CSW will continue to follow and assist as needed.   Employment status:  Disabled (Comment on  whether or not currently receiving Disability), Retired Nurse, adult PT Recommendations:  Not assessed at this time Information / Referral to community resources:  [place]  Patient/Family's Response to care:  Patient's husband is agreeable to [facility] in [place].   Patient/Family's Understanding of and Emotional Response to Diagnosis, Current Treatment, and Prognosis:  Patient's husband was very pleasant and thanked CSW for assistance.   Emotional Assessment Appearance:  Appears stated age Attitude/Demeanor/Rapport:    Affect (typically observed):  Pleasant Orientation:  Oriented to Self, Oriented to Place, Oriented to  Time, Fluctuating Orientation (Suspected and/or reported Sundowners) Alcohol / Substance use:  Not Applicable Psych involvement (Current and /or in the community):  No (Comment)  Discharge Needs  Concerns to be addressed:  Discharge Planning Concerns Readmission within the last 30 days:  No Current discharge risk:  Dependent with Mobility, Cognitively Impaired Barriers to Discharge:  Continued Medical Work up   [employer], Veronia Beets, LCSW 07/13/2018, 2:01 PM

## 2018-07-13 NOTE — Evaluation (Signed)
Physical Therapy Evaluation Patient Details Name: Ariana [name] MRN: [id number] DOB: 1932/11/16 Today's Date: 07/13/2018   History of Present Illness  Pt is a 84 y/o F with recent fall and now with infected R leg hematoma. Pt s/p multiple I&D. Pt's PMH includes dementia, PE.      Clinical Impression  Pt admitted with above diagnosis. Pt currently with functional limitations due to the deficits listed below (see PT Problem List). Ms. Whittenberg was pleasant and but limited by pain s/p I&D.  Pt completed therapeutic exercises in supine with assist.  She required mod assist for supine>sit and min assist for sit>supine.  Unable to attempt OOB activity this session due to pain.  Given pt's current mobility status, recommending SNF at d/c.  Pt will benefit from skilled PT to increase their independence and safety with mobility to allow discharge to the venue listed below.      Follow Up Recommendations SNF    Equipment Recommendations  Other (comment)(TBD at next venue of care; will likely need RW)    Recommendations for Other Services (consider OT referral pending pt's mobility progression)     Precautions / Restrictions Precautions Precautions: Fall Restrictions Weight Bearing Restrictions: Yes RLE Weight Bearing: Weight bearing as tolerated Other Position/Activity Restrictions: Verbally confirmed WBAT RLE with Dr. Tressia Miners on 10/2 at 15:40.        Mobility  Bed Mobility Overal bed mobility: Needs Assistance Bed Mobility: Supine to Sit;Sit to Supine     Supine to sit: HOB elevated;Mod assist Sit to supine: Min assist   General bed mobility comments: Mod assist to elevate trunk as pt uses bed rail for assist for supine>sit.  Pt requires assist to bring RLE into bed for sit>supine.   Transfers                 General transfer comment: Unable to attempt this session, pt refuses multiple times due to pain  Ambulation/Gait                Stairs             Wheelchair Mobility    Modified Rankin (Stroke Patients Only)       Balance Overall balance assessment: Needs assistance;History of Falls Sitting-balance support: Single extremity supported;Feet unsupported Sitting balance-Leahy Scale: Poor Sitting balance - Comments: Pt relies on at least 1UE support to sit EOB                                     Pertinent Vitals/Pain Pain Assessment: Faces Faces Pain Scale: Hurts whole lot Pain Location: RLE Pain Descriptors / Indicators: Aching;Tightness;Grimacing;Guarding;Moaning Pain Intervention(s): Limited activity within patient's tolerance;Monitored during session;Premedicated before session    Home Living Family/patient expects to be discharged to:: Private residence Living Arrangements: Spouse/significant other Available Help at Discharge: Family;Available 24 hours/day(although husband uses cane, limited physical assist) Type of Home: Mobile home Home Access: Stairs to enter   Entrance Stairs-Number of Steps: 4 Home Layout: One level Home Equipment: Grab bars - tub/shower;Hand held shower head(husband has a cane but uses it intermittently)      Prior Function Level of Independence: Independent         Comments: Pt was ambulating without AD and reports ~10 falls in the past 6 months.  Pt was independent with all ADLs, IADLs.  Once RLE began bothering her, husband assisted some with cooking and cleaning but pt remained  independent with ADLs.       Hand Dominance        Extremity/Trunk Assessment   Upper Extremity Assessment Upper Extremity Assessment: (BUE strength grossly 4/5, bruising along BUEs)    Lower Extremity Assessment Lower Extremity Assessment: RLE deficits/detail RLE Deficits / Details: bandage around R lower leg and brusing along BLEs.  Apparent BLE venous insufficiency with hemosiderin staining RLE: Unable to fully assess due to pain RLE Sensation: decreased light touch(distal to  bandage)       Communication   Communication: No difficulties  Cognition Arousal/Alertness: Awake/alert Behavior During Therapy: WFL for tasks assessed/performed Overall Cognitive Status: History of cognitive impairments - at baseline                                 General Comments: per chart, h/o dementia, although pt answering all questions appropriately at this time      General Comments General comments (skin integrity, edema, etc.): SpO2 checked in sitting and reading 95% on 2L    Exercises General Exercises - Lower Extremity Ankle Circles/Pumps: AROM;Left;10 reps;5 reps;Right;Supine(5 on R with limited ROM) Quad Sets: Strengthening;Both;5 reps;Supine Hip ABduction/ADduction: AAROM;Right;10 reps;Supine Straight Leg Raises: AAROM;Right;10 reps;Supine   Assessment/Plan    PT Assessment Patient needs continued PT services  PT Problem List Decreased strength;Pain;Decreased range of motion;Decreased activity tolerance;Decreased balance;Decreased mobility;Decreased cognition;Decreased knowledge of use of DME;Decreased safety awareness;Impaired sensation       PT Treatment Interventions DME instruction;Gait training;Stair training;Functional mobility training;Therapeutic activities;Therapeutic exercise;Balance training;Neuromuscular re-education;Cognitive remediation;Patient/family education;Wheelchair mobility training;Modalities    PT Goals (Current goals can be found in the Care Plan section)  Acute Rehab PT Goals Patient Stated Goal: decreased pain PT Goal Formulation: With patient Time For Goal Achievement: 07/27/18 Potential to Achieve Goals: Good    Frequency Min 2X/week   Barriers to discharge Inaccessible home environment;Decreased caregiver support Steps to enter home and husband unable to provide level of assist the pt currently requires    Co-evaluation               AM-PAC PT "6 Clicks" Daily Activity  Outcome Measure Difficulty  turning over in bed (including adjusting bedclothes, sheets and blankets)?: Unable Difficulty moving from lying on back to sitting on the side of the bed? : Unable Difficulty sitting down on and standing up from a chair with arms (e.g., wheelchair, bedside commode, etc,.)?: Unable Help needed moving to and from a bed to chair (including a wheelchair)?: Total Help needed walking in hospital room?: Total Help needed climbing 3-5 steps with a railing? : Total 6 Click Score: 6    End of Session Equipment Utilized During Treatment: Oxygen Activity Tolerance: Patient limited by pain Patient left: in bed;with call bell/phone within reach;with bed alarm set;with family/visitor present Nurse Communication: Mobility status;Other (comment)(pt limited by pain) PT Visit Diagnosis: Pain;Unsteadiness on feet (R26.81);Muscle weakness (generalized) (M62.81);History of falling (Z91.81);Difficulty in walking, not elsewhere classified (R26.2) Pain - Right/Left: Right Pain - part of body: Leg    Time: 6962-9528 PT Time Calculation (min) (ACUTE ONLY): 25 min   Charges:   PT Evaluation $PT Eval Moderate Complexity: 1 Mod PT Treatments $Therapeutic Exercise: 8-22 mins        Collie Siad PT, DPT 07/13/2018, 4:39 PM

## 2018-07-13 NOTE — Anesthesia Postprocedure Evaluation (Signed)
Anesthesia Post Note  Patient: Ariana [name]  Procedure(s) Performed: IRRIGATION AND DEBRIDEMENT HEMATOMA-RIGHT SHIN (Right Leg Lower)  Patient location during evaluation: PACU Anesthesia Type: General Level of consciousness: awake and alert and oriented Pain management: pain level controlled Vital Signs Assessment: post-procedure vital signs reviewed and stable Respiratory status: spontaneous breathing, nonlabored ventilation and respiratory function stable Cardiovascular status: blood pressure returned to baseline and stable Postop Assessment: no signs of nausea or vomiting Anesthetic complications: no     Last Vitals:  Vitals:   07/13/18 1345 07/13/18 1359  BP: 102/61 (!) 122/59  Pulse: 79 77  Resp: 20 18  Temp: (!) 36.4 C   SpO2: 98% 94%    Last Pain:  Vitals:   07/13/18 1341  TempSrc:   PainSc: 3                  Minta Fair

## 2018-07-13 NOTE — Transfer of Care (Signed)
Immediate Anesthesia Transfer of Care Note  Patient: Ariana [name]  Procedure(s) Performed: IRRIGATION AND DEBRIDEMENT HEMATOMA-RIGHT SHIN (Right )  Patient Location: PACU  Anesthesia Type:General  Level of Consciousness: awake  Airway & Oxygen Therapy: Patient Spontanous Breathing and Patient connected to face mask oxygen  Post-op Assessment: Report given to RN and Post -op Vital signs reviewed and stable  Post vital signs: Reviewed and stable  Last Vitals:  Vitals Value Taken Time  BP 139/63 07/13/2018 12:39 PM  Temp    Pulse 86 07/13/2018 12:42 PM  Resp 13 07/13/2018 12:42 PM  SpO2 100 % 07/13/2018 12:42 PM  Vitals shown include unvalidated device data.  Last Pain:  Vitals:   07/13/18 1054  TempSrc: Tympanic  PainSc:          Complications: No apparent anesthesia complications

## 2018-07-13 NOTE — NC FL2 (Signed)
[place] LEVEL OF CARE SCREENING TOOL     IDENTIFICATION  Patient Name: Ariana [name] Birthdate: October 14, 1932 Sex: female Admission Date (Current Location): 07/12/2018  [place] and [place] Number:  Engineering geologist and Address:  [facility], [address], [place], [place] [address]      Provider Number: [id number]  Attending Physician Name and Address:  Hillary Bow, MD  Relative Name and Phone Number:       Current Level of Care: Hospital Recommended Level of Care: [place] Prior Approval Number:    Date Approved/Denied:   PASRR Number: [id number])  Discharge Plan: SNF    Current Diagnoses: Patient Active Problem List   Diagnosis Date Noted  . Traumatic hematoma of right lower leg 07/12/2018  . Asthma without status asthmaticus 11/25/2016  . Late onset Alzheimer's disease without behavioral disturbance ([place]) 12/30/2015  . Back muscle spasm 09/09/2015  . Clinical depression 04/12/2015  . Herpes zona 04/12/2015  . Chronic ITP (idiopathic thrombocytopenic purpura) (HCC) 03/15/2015  . Chest pain 10/22/2014  . Breathlessness on exertion 10/22/2014  . Osteopenia 10/18/2014  . Benign essential HTN 06/28/2014  . HLD (hyperlipidemia) 06/28/2014  . Acquired hypothyroidism 06/04/2014  . Anxiety and depression 06/04/2014  . Bursitis, trochanteric 03/27/2014  . DDD (degenerative disc disease), lumbar 02/27/2014  . Neuritis or radiculitis due to rupture of lumbar intervertebral disc 02/27/2014    Orientation RESPIRATION BLADDER Height & Weight     Self, Time, Place  O2(2 Liters Oxygen. ) Continent Weight: 143 lb (64.9 kg) Height:     BEHAVIORAL SYMPTOMS/MOOD NEUROLOGICAL BOWEL NUTRITION STATUS      Continent Diet(Diet: NPO for surgery to be advanced. )  AMBULATORY STATUS COMMUNICATION OF NEEDS Skin   Extensive Assist Verbally Surgical wounds(Incision: Right Leg. )                        Personal Care Assistance Level of Assistance  Bathing, Feeding, Dressing Bathing Assistance: Limited assistance Feeding assistance: Independent Dressing Assistance: Limited assistance     Functional Limitations Info  Sight, Hearing, Speech Sight Info: Adequate Hearing Info: Adequate Speech Info: Adequate    SPECIAL CARE FACTORS FREQUENCY  PT (By licensed PT), OT (By licensed OT)     PT Frequency: (5) OT Frequency: (5)            Contractures      Additional Factors Info  Code Status, Allergies Code Status Info: (Full Code. ) Allergies Info: (Aspirin, Diazepam, Morphine, Other, Tetanus Toxoids)           Current Medications (07/13/2018):  This is the current hospital active medication list Current Facility-Administered Medications  Medication Dose Route Frequency Provider Last Rate Last Dose  . [MAR Hold] acetaminophen (TYLENOL) tablet 650 mg  650 mg Oral Q6H PRN Hillary Bow, MD   650 mg at 07/12/18 2058   Or  . [MAR Hold] acetaminophen (TYLENOL) suppository 650 mg  650 mg Rectal Q6H PRN Hillary Bow, MD      . Doug Sou Hold] albuterol (PROVENTIL) (2.5 MG/3ML) 0.083% nebulizer solution 2.5 mg  2.5 mg Nebulization Q2H PRN Sudini, Alveta Heimlich, MD      . Doug Sou Hold] ALPRAZolam Duanne Moron) tablet 0.5 mg  0.5 mg Oral TID PRN Hillary Bow, MD   0.5 mg at 07/12/18 2105  . [MAR Hold] bacitracin ointment 1 application  1 application Topical Once Merlyn Lot, MD      . Doug Sou Hold] bisacodyl (DULCOLAX)  suppository 10 mg  10 mg Rectal Daily PRN Hillary Bow, MD      . Doug Sou Hold] buPROPion (WELLBUTRIN XL) 24 hr tablet 150 mg  150 mg Oral Daily Hillary Bow, MD   150 mg at 07/13/18 0957  . [MAR Hold] citalopram (CELEXA) tablet 40 mg  40 mg Oral Daily Hillary Bow, MD   40 mg at 07/13/18 0957  . [MAR Hold] docusate sodium (COLACE) capsule 100 mg  100 mg Oral BID Hillary Bow, MD   100 mg at 07/13/18 0957  . [MAR Hold] donepezil (ARICEPT) tablet 10 mg  10 mg Oral QHS Hillary Bow, MD   10 mg at 07/12/18 2117  . fentaNYL (SUBLIMAZE) 100 MCG/2ML injection           . fentaNYL (SUBLIMAZE) 100 MCG/2ML injection           . fentaNYL (SUBLIMAZE) injection 25-50 mcg  25-50 mcg Intravenous Q5 min PRN Penwarden, Amy, MD   50 mcg at 07/13/18 1308  . [MAR Hold] fluticasone (FLONASE) 50 MCG/ACT nasal spray 2 spray  2 spray Each Nare Daily Hillary Bow, MD   2 spray at 07/13/18 1021  . [MAR Hold] gabapentin (NEURONTIN) capsule 300 mg  300 mg Oral QID Hillary Bow, MD   300 mg at 07/13/18 0957  . [MAR Hold] HYDROcodone-acetaminophen (NORCO/VICODIN) 5-325 MG per tablet 1-2 tablet  1-2 tablet Oral Q6H PRN Hillary Bow, MD   1 tablet at 07/13/18 0957  . lactated ringers infusion   Intravenous Continuous Penwarden, Amy, MD 50 mL/hr at 07/13/18 1057    . [MAR Hold] levothyroxine (SYNTHROID, LEVOTHROID) tablet 50 mcg  50 mcg Oral QAC breakfast Hillary Bow, MD   50 mcg at 07/13/18 0957  . [MAR Hold] memantine (NAMENDA) tablet 5 mg  5 mg Oral BID Hillary Bow, MD   5 mg at 07/13/18 0957  . meperidine (DEMEROL) injection 6.25-12.5 mg  6.25-12.5 mg Intravenous Q5 min PRN Penwarden, Amy, MD      . Doug Sou Hold] montelukast (SINGULAIR) tablet 10 mg  10 mg Oral QHS Hillary Bow, MD   10 mg at 07/12/18 2116  . [MAR Hold] morphine 2 MG/ML injection 2 mg  2 mg Intravenous Q4H PRN Hillary Bow, MD      . Doug Sou Hold] ondansetron [facility]) tablet 4 mg  4 mg Oral Q6H PRN Hillary Bow, MD       Or  . Doug Sou Hold] ondansetron (ZOFRAN) injection 4 mg  4 mg Intravenous Q6H PRN Sudini, Alveta Heimlich, MD      . Doug Sou Hold] oxybutynin (DITROPAN) tablet 5 mg  5 mg Oral TID Hillary Bow, MD   5 mg at 07/13/18 0957  . oxyCODONE (Oxy IR/ROXICODONE) immediate release tablet 5 mg  5 mg Oral Once PRN Penwarden, Amy, MD       Or  . oxyCODONE (ROXICODONE) 5 MG/5ML solution 5 mg  5 mg Oral Once PRN Penwarden, Amy, MD      . Doug Sou Hold] polyethylene glycol (MIRALAX / GLYCOLAX) packet 17 g  17 g Oral Daily PRN  Sudini, Alveta Heimlich, MD      . Doug Sou Hold] pravastatin (PRAVACHOL) tablet 20 mg  20 mg Oral Daily Hillary Bow, MD   20 mg at 07/13/18 0958  . [MAR Hold] predniSONE (DELTASONE) tablet 10 mg  10 mg Oral Daily Hillary Bow, MD   10 mg at 07/13/18 0958  . promethazine (PHENERGAN) injection 6.25-12.5 mg  6.25-12.5 mg Intravenous Q15 min PRN Penwarden, Amy, MD  6.25 mg at 07/13/18 1315  . sodium chloride flush 0.9 % injection              Discharge Medications: Please see discharge summary for a list of discharge medications.  Relevant Imaging Results:  Relevant Lab Results:   Additional Information (SSN: [id number])  Jerrion Tabbert, Veronia Beets, LCSW

## 2018-07-13 NOTE — Progress Notes (Signed)
PT Cancellation Note  Patient Details Name: Ariana [name] MRN: [id number] DOB: 13-May-1933   Cancelled Treatment:    Reason Eval/Treat Not Completed: Patient at procedure or test/unavailable.  Pt preparing to leave floor for I&D.  Will attempt to see pt again later today, schedule permitting.    Collie Siad PT, DPT 07/13/2018, 9:48 AM

## 2018-07-13 NOTE — Progress Notes (Signed)
[place] [place] [facility] [place] NAME: Ariana [name]    MR#:  [id number]  DATE OF BIRTH:  06-01-1933  SUBJECTIVE:  CHIEF COMPLAINT:   Chief Complaint  Patient presents with  . Wound Check   -Patient very independent at baseline-had a fall and has a right leg hematoma -Possible infected hematoma.  Will go for OR today for evacuation of the clot  REVIEW OF SYSTEMS:  Review of Systems  Constitutional: Negative for chills, fever and malaise/fatigue.  HENT: Negative for congestion, ear discharge, hearing loss and nosebleeds.   Eyes: Negative for blurred vision and double vision.  Respiratory: Negative for cough, shortness of breath and wheezing.   Cardiovascular: Negative for chest pain, palpitations and leg swelling.  Gastrointestinal: Negative for abdominal pain, constipation, diarrhea, nausea and vomiting.  Genitourinary: Negative for dysuria.  Musculoskeletal: Positive for myalgias.  Neurological: Negative for dizziness, focal weakness, seizures, weakness and headaches.  Psychiatric/Behavioral: Negative for depression.    DRUG ALLERGIES:   Allergies  Allergen Reactions  . Aspirin     Other reaction(s): Distress (finding)  . Diazepam     Other reaction(s): Itching of Skin sneezing  . Morphine     Other reaction(s): Itching of Skin  . Other     Other reaction(s): Unknown seasonal allergies  . Tetanus Toxoids     Other reaction(s): Localized superficial swelling of skin    VITALS:  Blood pressure (!) 122/59, pulse 77, temperature (!) 97.5 F (36.4 C), resp. rate 18, weight 64.9 kg, SpO2 94 %.  PHYSICAL EXAMINATION:  Physical Exam  GENERAL:  84 y.o.-year-old patient lying in the bed with no acute distress.  EYES: Pupils equal, round, reactive to light and accommodation. No scleral icterus. Extraocular muscles intact.  HEENT: Head atraumatic, normocephalic. Oropharynx and nasopharynx clear.  NECK:  Supple, no jugular venous  distention. No thyroid enlargement, no tenderness.  LUNGS: Normal breath sounds bilaterally, no wheezing, rales,rhonchi or crepitation. No use of accessory muscles of respiration.  CARDIOVASCULAR: S1, S2 normal. No  rubs, or gallops. 2/6 systolic murmur present  ABDOMEN: Soft, nontender, nondistended. Bowel sounds present. No organomegaly or mass.  EXTREMITIES: has chronic discoloration on both legs -Significantly erythematous right leg with dark extensive clot in the edges seems to be infected No pedal edema, cyanosis, or clubbing.  NEUROLOGIC: Cranial nerves II through XII are intact. Muscle strength 5/5 in all extremities. Sensation intact. Gait not checked.  PSYCHIATRIC: The patient is alert and oriented x 3.  SKIN: No obvious rash, lesion, or ulcer.    LABORATORY PANEL:   CBC Recent Labs  Lab 07/13/18 0401  WBC 12.1*  HGB 9.4*  HCT 29.2*  PLT 129*   ------------------------------------------------------------------------------------------------------------------  Chemistries  Recent Labs  Lab 07/13/18 0401  NA 140  K 3.7  CL 106  CO2 28  GLUCOSE 73  BUN 19  CREATININE 0.93  CALCIUM 8.6*   ------------------------------------------------------------------------------------------------------------------  Cardiac Enzymes No results for input(s): TROPONINI in the last 168 hours. ------------------------------------------------------------------------------------------------------------------  RADIOLOGY:  Dg Tibia/fibula Right  Result Date: 07/12/2018 CLINICAL DATA:  Right leg pain with swelling EXAM: RIGHT TIBIA AND FIBULA - 2 VIEW COMPARISON:  07/11/2018 FINDINGS: No fracture or malalignment. Diffuse soft tissue edema without radiopaque foreign body or soft tissue emphysema. Protuberant soft tissue mid aspect of the lower leg. No periostitis or bone destruction IMPRESSION: 1. No acute osseous abnormality 2. Diffuse soft tissue edema without radiopaque foreign body or  soft tissue emphysema. Electronically Signed  By: Donavan Foil M.D.   On: 07/12/2018 16:05   Dg Tibia/fibula Right  Result Date: 07/11/2018 CLINICAL DATA:  Right lower leg pain following a fall last night. EXAM: RIGHT TIBIA AND FIBULA - 2 VIEW COMPARISON:  None. FINDINGS: Patellofemoral joint space narrowing and mild spur formation. Mild lateral knee spur formation. No fracture or dislocation. IMPRESSION: 1. No fracture or dislocation. 2. Knee degenerative changes. Electronically Signed   By: Claudie Revering M.D.   On: 07/11/2018 17:55   Ct Head Wo Contrast  Result Date: 07/11/2018 CLINICAL DATA:  84 year old female with Alzheimer's disease and low platelets. Recent falls. Initial encounter. EXAM: CT HEAD WITHOUT CONTRAST TECHNIQUE: Contiguous axial images were obtained from the base of the skull through the vertex without intravenous contrast. COMPARISON:  03/23/2017. FINDINGS: Brain: No intracranial hemorrhage or CT evidence of large acute infarct. Chronic microvascular changes. Global atrophy. No intracranial mass lesion noted on this unenhanced exam. Vascular: Vascular calcifications. Skull: No skull fracture. Sinuses/Orbits: No acute orbital abnormality. Visualized paranasal sinuses clear. Other: Mastoid air cells and middle ear cavities clear. IMPRESSION: 1. No skull fracture or intracranial hemorrhage. 2. Chronic microvascular changes. 3. Global atrophy. Electronically Signed   By: Genia Del M.D.   On: 07/11/2018 17:40    EKG:   Orders placed or performed during the hospital encounter of 07/11/18  . ED EKG  . ED EKG  . EKG 12-Lead  . EKG 12-Lead    ASSESSMENT AND PLAN:   84 year old female with past medical history significant for ITP, dementia, chronic anemia presents to hospital after a fall and right leg hematoma  1.  Infected right leg hematoma-appreciate surgery consult. -Patient going to the OR for clot evacuation today. -Continue pain control with pain meds, physical  therapy -Received 1 dose of Rocephin on admission. -W BC is improving.  Will start Ancef for the infection.  2.  Depression and dementia-seems to be at baseline.  Pretty well oriented.  On Aricept, and Namenda.  Also on bupropion, Celexa  3.  Hypothyroidism-on Synthroid  4.  Chronic ITP-platelet count greater than 100.  5.  DVT prophylaxis-we will start on subcutaneous Lovenox  Physical therapy consulted. Husband updated at bedside    All the records are reviewed and case discussed with Care Management/Social Workerr. Management plans discussed with the patient, family and they are in agreement.  CODE STATUS: Full Code  TOTAL TIME TAKING CARE OF THIS PATIENT: 38 minutes.   POSSIBLE D/C IN 2 DAYS, DEPENDING ON CLINICAL CONDITION.   Gladstone Lighter M.D on 07/13/2018 at 2:34 PM  Between 7am to 6pm - Pager - [phone]  After 6pm go to [email or web address] - password [email or web address] [facility]  Office  [phone]  CC: Primary care physician; Idelle Crouch, MD

## 2018-07-13 NOTE — Anesthesia Post-op Follow-up Note (Signed)
Anesthesia QCDR form completed.        

## 2018-07-13 NOTE — Anesthesia Preprocedure Evaluation (Signed)
Anesthesia Evaluation  Patient identified by MRN, date of birth, ID band Patient awake    Reviewed: Allergy & Precautions, NPO status , Patient's Chart, lab work & pertinent test results  History of Anesthesia Complications Negative for: history of anesthetic complications  Airway Mallampati: II  TM Distance: >3 FB Neck ROM: Full    Dental  (+) Poor Dentition   Pulmonary asthma , neg sleep apnea,    breath sounds clear to auscultation- rhonchi (-) wheezing      Cardiovascular (-) CAD, (-) Past MI, (-) Cardiac Stents and (-) CABG  Rhythm:Regular Rate:Normal - Systolic murmurs and - Diastolic murmurs    Neuro/Psych PSYCHIATRIC DISORDERS Anxiety Depression    GI/Hepatic Neg liver ROS, hiatal hernia, GERD  ,  Endo/Other  neg diabetesHypothyroidism   Renal/GU negative Renal ROS     Musculoskeletal  (+) Arthritis ,   Abdominal (+) - obese,   Peds  Hematology ITP   Anesthesia Other Findings Past Medical History: No date: Alzheimer disease ([place]) No date: Anemia No date: Anxiety No date: Asthma No date: Chronic back pain No date: Depression No date: Depression No date: GERD (gastroesophageal reflux disease) No date: Hiatal hernia No date: Hypercholesteremia No date: ITP (idiopathic thrombocytopenic purpura) No date: Osteoarthritis No date: Osteoporosis No date: Pulmonary emboli (HCC) No date: Restless legs   Reproductive/Obstetrics                             Anesthesia Physical Anesthesia Plan  ASA: II  Anesthesia Plan: General   Post-op Pain Management:    Induction: Intravenous  PONV Risk Score and Plan: 2 and Dexamethasone, Ondansetron and Midazolam  Airway Management Planned: LMA  Additional Equipment:   Intra-op Plan:   Post-operative Plan:   Informed Consent: I have reviewed the patients History and Physical, chart, labs and discussed the procedure including the  risks, benefits and alternatives for the proposed anesthesia with the patient or authorized representative who has indicated his/her understanding and acceptance.   Dental advisory given  Plan Discussed with: CRNA and Anesthesiologist  Anesthesia Plan Comments:         Anesthesia Quick Evaluation

## 2018-07-13 NOTE — Anesthesia Procedure Notes (Signed)
Procedure Name: LMA Insertion Date/Time: 07/13/2018 11:48 AM Performed by: Demetrius Charity, CRNA Pre-anesthesia Checklist: Patient identified, Patient being monitored, Timeout performed, Emergency Drugs available and Suction available Patient Re-evaluated:Patient Re-evaluated prior to induction Oxygen Delivery Method: Circle system utilized Preoxygenation: Pre-oxygenation with 100% oxygen Induction Type: IV induction Ventilation: Mask ventilation without difficulty LMA: LMA inserted LMA Size: 3.5 Tube type: Oral Number of attempts: 1 Placement Confirmation: positive ETCO2 and breath sounds checked- equal and bilateral Tube secured with: Tape Dental Injury: Teeth and Oropharynx as per pre-operative assessment

## 2018-07-13 NOTE — Clinical Social Work Placement (Signed)
   CLINICAL SOCIAL WORK PLACEMENT  NOTE  Date:  07/13/2018  Patient Details  Name: Ariana [name] MRN: [id number] Date of Birth: Jun 08, 1933  Clinical Social Work is seeking post-discharge placement for this patient at the [place] level of care (*CSW will initial, date and re-position this form in  chart as items are completed):  Yes   Patient/family provided with [facility] Work Department's list of facilities offering this level of care within the geographic area requested by the patient (or if unable, by the patient's family).  Yes   Patient/family informed of their freedom to choose among providers that offer the needed level of care, that participate in Medicare, Medicaid or managed care program needed by the patient, have an available bed and are willing to accept the patient.  Yes   Patient/family informed of [facility]'s ownership interest in [facility] and [facility], as well as of the fact that they are under no obligation to receive care at these facilities.  PASRR submitted to EDS on       PASRR number received on       Existing PASRR number confirmed on 07/13/18     FL2 transmitted to all facilities in geographic area requested by pt/family on 07/13/18     FL2 transmitted to all facilities within larger geographic area on       Patient informed that his/her managed care company has contracts with or will negotiate with certain facilities, including the following:            Patient/family informed of bed offers received.  Patient chooses bed at       Physician recommends and patient chooses bed at      Patient to be transferred to   on  .  Patient to be transferred to facility by       Patient family notified on   of transfer.  Name of family member notified:        PHYSICIAN       Additional Comment:    _______________________________________________ Ariana [name], Ariana Beets, LCSW 07/13/2018, 2:00 PM

## 2018-07-13 NOTE — Op Note (Signed)
ATTENDING Surgeon(s): Herbert Pun, MD   ANESTHESIA: General   PRE-OPERATIVE DIAGNOSIS: Right leg infected hematoma   POST-OPERATIVE DIAGNOSIS: Right leg infected hematoma   PROCEDURE(S):  1.) Sharp excisional debridement of infected hematoma down to dermis    INTRAOPERATIVE FINDINGS:  A 14 cm x 6 cm hematoma with purulent secretions   ESTIMATED BLOOD LOSS: Minimal (10 mL)      SPECIMENS: hematoma for culture and pathology   COMPLICATIONS: None apparent   CONDITION AT END OF PROCEDURE: Hemodynamically stable and awake   INDICATIONS FOR PROCEDURE:  Patient is 84 y.o. female with large right leg hematoma that was lancet by ED physician and got infected. Patient with leukocytosis and purulent secretions on hematoma.      DETAILS OF PROCEDURE: After informed consent,patient was taken to the OR. Time out performed. General anesthesia induced. Patient placed on supine position. The right leg was cleaned and draped in sterile fashion. With #15 blade, hematoma borders was debrided. The clot was removed. Sharp debridement of the wound borders and bed was done to healthy dermis tissue. Hemostasis achieved. Silvadene cream was applied with sterile dressing. Patient tolerated the procedure well.

## 2018-07-13 NOTE — Progress Notes (Signed)
Pharmacy Antibiotic Note  Ariana [name] is a 84 y.o. female admitted on 07/12/2018 with cellulitis.  Pharmacy has been consulted for cefazolin dosing.  Plan: Cefazolin 1 g IV q8h  Weight: 143 lb (64.9 kg)  Temp (24hrs), Avg:98 F (36.7 C), Min:97.4 F (36.3 C), Max:98.6 F (37 C)  Recent Labs  Lab 07/11/18 1335 07/12/18 1231 07/13/18 0401  WBC 14.3* 16.4* 12.1*  CREATININE 1.02* 0.91 0.93    Estimated Creatinine Clearance: 42.9 mL/min (by C-G formula based on SCr of 0.93 mg/dL).    Allergies  Allergen Reactions  . Aspirin     Other reaction(s): Distress (finding)  . Diazepam     Other reaction(s): Itching of Skin sneezing  . Morphine     Other reaction(s): Itching of Skin  . Other     Other reaction(s): Unknown seasonal allergies  . Tetanus Toxoids     Other reaction(s): Localized superficial swelling of skin    Antimicrobials this admission: Ceftriaxone 10/1 x1 Cefazolin 10/2 >>  Dose adjustments this admission:   Microbiology results: Wcx 10/2 pending  Thank you for allowing pharmacy to be a part of this patient's care.  Rocky Morel 07/13/2018 2:46 PM

## 2018-07-13 NOTE — Progress Notes (Signed)
[place] Hospital Day(s): 0.   Post op day(s):  Marland Kitchen   Interval History: Patient seen and examined, no acute events or new complaints overnight. Patient reports continue with pain on right leg hematoma, denies fever or chills.  Vital signs in last 24 hours: [min-max] current  Temp:  [97.9 F (36.6 C)-98.6 F (37 C)] 98.6 F (37 C) (10/02 0117) Pulse Rate:  [62-80] 64 (10/02 0117) Resp:  [14-18] 14 (10/02 0117) BP: (127-160)/(47-69) 127/47 (10/02 0117) SpO2:  [96 %-100 %] 97 % (10/02 0117) Weight:  [64.9 kg] 64.9 kg (10/02 0500)       Weight: 64.9 kg BMI (Calculated): 23.43   Physical Exam:  Constitutional: alert, cooperative and no distress  Respiratory: breathing non-labored at rest  Cardiovascular: regular rate and sinus rhythm  Gastrointestinal: soft, non-tender, and non-distended Extremity: right leg large infected hematoma. Cellulitis with apparent purulence on the hematoma, no drainage at this moment.   Labs:  CBC Latest Ref Rng & Units 07/13/2018 07/12/2018 07/11/2018  WBC 3.6 - 11.0 K/uL 12.1(H) 16.4(H) 14.3(H)  Hemoglobin 12.0 - 16.0 g/dL 9.4(L) 10.6(L) 10.2(L)  Hematocrit 35.0 - 47.0 % 29.2(L) 32.9(L) 31.8(L)  Platelets 150 - 440 K/uL 129(L) 149(L) 180   CMP Latest Ref Rng & Units 07/13/2018 07/12/2018 07/11/2018  Glucose 70 - 99 mg/dL 73 75 83  BUN 8 - 23 mg/dL 19 21 25(H)  Creatinine 0.44 - 1.00 mg/dL 0.93 0.91 1.02(H)  Sodium 135 - 145 mmol/L 140 139 143  Potassium 3.5 - 5.1 mmol/L 3.7 3.6 3.9  Chloride 98 - 111 mmol/L 106 105 108  CO2 22 - 32 mmol/L 28 26 28   Calcium 8.9 - 10.3 mg/dL 8.6(L) 8.9 9.1  Total Protein 6.5 - 8.1 g/dL - - -  Total Bilirubin 0.3 - 1.2 mg/dL - - -  Alkaline Phos 38 - 126 U/L - - -  AST 15 - 41 U/L - - -  ALT 14 - 54 U/L - - -    Imaging studies: No new pertinent imaging studies   Assessment/Plan:  84 y.o. female with infected right leg hematoma, complicated by pertinent comorbidities including ITP, dementia,  anemia, asthma, GERD, history of PE. Patient re evaluated today and found without significant improvement of area. Patient continue with pain and cellulitis. Today patient agreed to proceed with surgery. Will take to OR for cleansing and debridement of the right leg. Oriented about procedure, benefits and risks.   Arnold Long, MD

## 2018-07-14 ENCOUNTER — Encounter: Payer: Self-pay | Admitting: General Surgery

## 2018-07-14 LAB — CBC
HEMATOCRIT: 28.9 % — AB (ref 35.0–47.0)
Hemoglobin: 9.2 g/dL — ABNORMAL LOW (ref 12.0–16.0)
MCH: 26 pg (ref 26.0–34.0)
MCHC: 31.8 g/dL — AB (ref 32.0–36.0)
MCV: 81.9 fL (ref 80.0–100.0)
PLATELETS: 124 10*3/uL — AB (ref 150–440)
RBC: 3.52 MIL/uL — ABNORMAL LOW (ref 3.80–5.20)
RDW: 19.5 % — AB (ref 11.5–14.5)
WBC: 16.5 10*3/uL — ABNORMAL HIGH (ref 3.6–11.0)

## 2018-07-14 MED ORDER — ALUM & MAG HYDROXIDE-SIMETH 200-200-20 MG/5ML PO SUSP: 15.0000 mL | Freq: Four times a day (QID) | ORAL | Status: DC | PRN

## 2018-07-14 NOTE — Progress Notes (Addendum)
PT is recommending SNF. Clinical Education officer, museum (CSW) met with patient and presented bed offers. She chose [facility]. CSW left patient's husband Ariana [name] a Advertising account executive. Per Tiffany admissions coordinator at [facility] she will start [name] authorization today.   Patient's husband Ariana [name] called CSW back and is in agreement with plan.   [employer], LCSW [phone]

## 2018-07-14 NOTE — Progress Notes (Addendum)
Physical Therapy Treatment Patient Details Name: Ariana [name] MRN: [id number] DOB: 11/08/1932 Today's Date: 07/14/2018    History of Present Illness Pt is a 84 y/o F with recent fall and now with infected R leg hematoma. Pt s/p multiple I&D. Pt's PMH includes dementia, PE.      PT Comments    Pt continues with pain this am despite pain medications but does state she feels a little better.  To edge of bed with effortful movement and HOB raised.  Increased time given with light min a only to help hips get to edge of bed.  Once sitting, she is able to do so without support.  Stood with walker with min a x 1. She required mod a for hand placements.  She is able to take several small steps to recliner with min a x 1.  Pt shuffles feet along to chair with heavy reliance on walker and does not take any true steps.  Limited by pain.  She remained in recliner after session and unable to attempt further gait.   Follow Up Recommendations  SNF     Equipment Recommendations       Recommendations for Other Services       Precautions / Restrictions Precautions Precautions: Fall Restrictions Weight Bearing Restrictions: Yes RLE Weight Bearing: Weight bearing as tolerated Other Position/Activity Restrictions: Verbally confirmed WBAT RLE with Dr. Tressia Miners on 10/2 at 15:40.      Mobility  Bed Mobility Overal bed mobility: Needs Assistance Bed Mobility: Supine to Sit     Supine to sit: Min assist;HOB elevated        Transfers Overall transfer level: Needs assistance Equipment used: Rolling walker (2 wheeled) Transfers: Sit to/from Stand Sit to Stand: Min assist            Ambulation/Gait Ambulation/Gait assistance: Min Web designer (Feet): 2 Feet Assistive device: Rolling walker (2 wheeled) Gait Pattern/deviations: Step-to pattern;Decreased step length - right;Decreased step length - left;Narrow base of support Gait velocity: decreased   General Gait  Details: Unable to tolerate gait besides transfering to recliner at bedside due to pain.   Stairs             Wheelchair Mobility    Modified Rankin (Stroke Patients Only)       Balance Overall balance assessment: Needs assistance;History of Falls Sitting-balance support: Single extremity supported;Feet unsupported Sitting balance-Leahy Scale: Fair     Standing balance support: Bilateral upper extremity supported Standing balance-Leahy Scale: Poor Standing balance comment: heavy reliance on walker, unable to take hands off for tasks.                            Cognition Arousal/Alertness: Awake/alert Behavior During Therapy: WFL for tasks assessed/performed Overall Cognitive Status: History of cognitive impairments - at baseline                                        Exercises      General Comments        Pertinent Vitals/Pain Pain Assessment: 0-10 Pain Score: 8  Pain Location: RLE Pain Descriptors / Indicators: Aching;Tightness;Grimacing;Guarding;Moaning Pain Intervention(s): Limited activity within patient's tolerance;Premedicated before session    Home Living                      Prior Function  PT Goals (current goals can now be found in the care plan section) Progress towards PT goals: Progressing toward goals    Frequency    Min 2X/week      PT Plan Current plan remains appropriate    Co-evaluation              AM-PAC PT "6 Clicks" Daily Activity  Outcome Measure  Difficulty turning over in bed (including adjusting bedclothes, sheets and blankets)?: Unable Difficulty moving from lying on back to sitting on the side of the bed? : Unable Difficulty sitting down on and standing up from a chair with arms (e.g., wheelchair, bedside commode, etc,.)?: Unable Help needed moving to and from a bed to chair (including a wheelchair)?: A Lot Help needed walking in hospital room?: A Lot Help  needed climbing 3-5 steps with a railing? : Total 6 Click Score: 8    End of Session Equipment Utilized During Treatment: Oxygen Activity Tolerance: Patient limited by pain Patient left: in chair;with chair alarm set;with call bell/phone within reach Nurse Communication: Mobility status Pain - Right/Left: Right Pain - part of body: Leg     Time: 0906-0920 PT Time Calculation (min) (ACUTE ONLY): 14 min  Charges:  $Therapeutic Activity: 8-22 mins                     Chesley Noon, PTA 07/14/18, 9:40 AM

## 2018-07-14 NOTE — Progress Notes (Signed)
[place] Hospital Day(s): 1.   Post op day(s): 1 Day Post-Op.   Interval History: Patient seen and examined, no acute events or new complaints overnight. Patient reports having pain on the surgical area. Refers she feels anxious and one Xanax does not do anything.  Vital signs in last 24 hours: [min-max] current  Temp:  [97.3 F (36.3 C)-98.3 F (36.8 C)] 97.9 F (36.6 C) (10/03 1657) Pulse Rate:  [67-80] 73 (10/03 1657) Resp:  [13-14] 13 (10/03 0304) BP: (120-142)/(62-71) 142/63 (10/03 1657) SpO2:  [97 %-100 %] 97 % (10/03 1657) Weight:  [65 kg] 65 kg (10/03 0604)       Weight: 65 kg BMI (Calculated): 23.46   Physical Exam:  Constitutional: alert, cooperative and no distress  Respiratory: breathing non-labored at rest  Extremity: Right leg with dressing in place. No sing of bleeding ,no edema or cyanosis.   Labs:  CBC Latest Ref Rng & Units 07/14/2018 07/13/2018 07/12/2018  WBC 3.6 - 11.0 K/uL 16.5(H) 12.1(H) 16.4(H)  Hemoglobin 12.0 - 16.0 g/dL 9.2(L) 9.4(L) 10.6(L)  Hematocrit 35.0 - 47.0 % 28.9(L) 29.2(L) 32.9(L)  Platelets 150 - 440 K/uL 124(L) 129(L) 149(L)   CMP Latest Ref Rng & Units 07/13/2018 07/12/2018 07/11/2018  Glucose 70 - 99 mg/dL 73 75 83  BUN 8 - 23 mg/dL 19 21 25(H)  Creatinine 0.44 - 1.00 mg/dL 0.93 0.91 1.02(H)  Sodium 135 - 145 mmol/L 140 139 143  Potassium 3.5 - 5.1 mmol/L 3.7 3.6 3.9  Chloride 98 - 111 mmol/L 106 105 108  CO2 22 - 32 mmol/L 28 26 28   Calcium 8.9 - 10.3 mg/dL 8.6(L) 8.9 9.1  Total Protein 6.5 - 8.1 g/dL - - -  Total Bilirubin 0.3 - 1.2 mg/dL - - -  Alkaline Phos 38 - 126 U/L - - -  AST 15 - 41 U/L - - -  ALT 14 - 54 U/L - - -    Imaging studies: No new pertinent imaging studies   Assessment/Plan:  84 y.o. female with infected hematoma 1 Day Post-Op s/p debridement.   Patient tolerated procedure well. No sign of bleeding or complication. Will consult wound care service for local care with silvadene cream.  Acticoat dressing may be considered. Agree with physical therapy.   Arnold Long, MD

## 2018-07-14 NOTE — Progress Notes (Signed)
[facility] [place] NAME: Ariana [name]    MR#:  [id number]  DATE OF BIRTH:  04/04/1933  SUBJECTIVE:  CHIEF COMPLAINT:   Chief Complaint  Patient presents with  . Wound Check    -That is post clot evacuation from her right leg hematoma.  Complains of significant pain. -Requiring 2 L oxygen still  REVIEW OF SYSTEMS:  Review of Systems  Constitutional: Negative for chills, fever and malaise/fatigue.  HENT: Negative for congestion, ear discharge, hearing loss and nosebleeds.   Eyes: Negative for blurred vision and double vision.  Respiratory: Negative for cough, shortness of breath and wheezing.   Cardiovascular: Negative for chest pain, palpitations and leg swelling.  Gastrointestinal: Negative for abdominal pain, constipation, diarrhea, nausea and vomiting.  Genitourinary: Negative for dysuria.  Musculoskeletal: Positive for myalgias.  Neurological: Negative for dizziness, focal weakness, seizures, weakness and headaches.  Psychiatric/Behavioral: Negative for depression.    DRUG ALLERGIES:   Allergies  Allergen Reactions  . Aspirin     Other reaction(s): Distress (finding)  . Diazepam     Other reaction(s): Itching of Skin sneezing  . Morphine     Other reaction(s): Itching of Skin  . Other     Other reaction(s): Unknown seasonal allergies  . Tetanus Toxoids     Other reaction(s): Localized superficial swelling of skin    VITALS:  Blood pressure 133/71, pulse 67, temperature (!) 97.3 F (36.3 C), temperature source Oral, resp. rate 13, weight 65 kg, SpO2 100 %.  PHYSICAL EXAMINATION:  Physical Exam  GENERAL:  84 y.o.-year-old patient lying in the bed with no acute distress.  EYES: Pupils equal, round, reactive to light and accommodation. No scleral icterus. Extraocular muscles intact.  HEENT: Head atraumatic, normocephalic. Oropharynx and nasopharynx clear.  NECK:  Supple, no jugular venous distention. No  thyroid enlargement, no tenderness.  LUNGS: Normal breath sounds bilaterally, no wheezing, rales,rhonchi or crepitation. No use of accessory muscles of respiration.  CARDIOVASCULAR: S1, S2 normal. No  rubs, or gallops. 2/6 systolic murmur present  ABDOMEN: Soft, nontender, nondistended. Bowel sounds present. No organomegaly or mass.  EXTREMITIES: has chronic discoloration on both legs -Significantly erythematous right leg with bruising all the way to the inner thigh.  Dressing in place to the leg.   No pedal edema, cyanosis, or clubbing.  NEUROLOGIC: Cranial nerves II through XII are intact. Muscle strength 5/5 in all extremities. Sensation intact. Gait not checked.  PSYCHIATRIC: The patient is alert and oriented x 3.  Some cognitive impairment noted SKIN: No obvious rash, lesion, or ulcer.    LABORATORY PANEL:   CBC Recent Labs  Lab 07/14/18 0414  WBC 16.5*  HGB 9.2*  HCT 28.9*  PLT 124*   ------------------------------------------------------------------------------------------------------------------  Chemistries  Recent Labs  Lab 07/13/18 0401  NA 140  K 3.7  CL 106  CO2 28  GLUCOSE 73  BUN 19  CREATININE 0.93  CALCIUM 8.6*   ------------------------------------------------------------------------------------------------------------------  Cardiac Enzymes No results for input(s): TROPONINI in the last 168 hours. ------------------------------------------------------------------------------------------------------------------  RADIOLOGY:  Dg Tibia/fibula Right  Result Date: 07/12/2018 CLINICAL DATA:  Right leg pain with swelling EXAM: RIGHT TIBIA AND FIBULA - 2 VIEW COMPARISON:  07/11/2018 FINDINGS: No fracture or malalignment. Diffuse soft tissue edema without radiopaque foreign body or soft tissue emphysema. Protuberant soft tissue mid aspect of the lower leg. No periostitis or bone destruction IMPRESSION: 1. No acute osseous abnormality 2. Diffuse soft tissue  edema without radiopaque foreign body  or soft tissue emphysema. Electronically Signed   By: Donavan Foil M.D.   On: 07/12/2018 16:05    EKG:   Orders placed or performed during the hospital encounter of 07/11/18  . ED EKG  . ED EKG  . EKG 12-Lead  . EKG 12-Lead    ASSESSMENT AND PLAN:   84 year old female with past medical history significant for ITP, dementia, chronic anemia presents to hospital after a fall and right leg hematoma  1.  Infected right leg hematoma-appreciate surgery consult. -Patient had 16 cm x 6 cm excisional debridement of the infected hematoma down to the dermis. -Complains of significant pain.  Continue Silvadene ointment application with sterile dressing. -Continue pain control with pain meds, physical therapy -recommended rehab -Ancef for the infection.  2.  Depression and dementia-seems to be at baseline.  Pretty well oriented.  On Aricept, and Namenda.  Also on bupropion, Celexa  3.  Hypothyroidism-on Synthroid  4.  Chronic ITP-platelet count greater than 100.  5.  DVT prophylaxis- subcutaneous Lovenox  6.  Hypoxia-likely poor inspiratory effort.  Discontinue IV fluids.  Encourage incentive spirometry and wean off the 2 L oxygen as tolerated -If no improvement consider chest x-ray in a.m.  Physical therapy consulted. Social worker consulted for rehab placement    All the records are reviewed and case discussed with Care Management/Social Workerr. Management plans discussed with the patient, family and they are in agreement.  CODE STATUS: Full Code  TOTAL TIME TAKING CARE OF THIS PATIENT: 35 minutes.   POSSIBLE D/C IN 1-2 DAYS, DEPENDING ON CLINICAL CONDITION.   Gladstone Lighter M.D on 07/14/2018 at 10:03 AM  Between 7am to 6pm - Pager - [phone]  After 6pm go to [email or web address] - password [email or web address] [facility]  Office  [phone]  CC: Primary care physician; Idelle Crouch, MD

## 2018-07-15 DIAGNOSIS — D649 Anemia, unspecified: Secondary | ICD-10-CM | POA: Diagnosis not present

## 2018-07-15 DIAGNOSIS — S8011XA Contusion of right lower leg, initial encounter: Secondary | ICD-10-CM | POA: Diagnosis not present

## 2018-07-15 DIAGNOSIS — F329 Major depressive disorder, single episode, unspecified: Secondary | ICD-10-CM | POA: Diagnosis not present

## 2018-07-15 DIAGNOSIS — G309 Alzheimer's disease, unspecified: Secondary | ICD-10-CM | POA: Diagnosis not present

## 2018-07-15 DIAGNOSIS — S81801A Unspecified open wound, right lower leg, initial encounter: Secondary | ICD-10-CM | POA: Diagnosis not present

## 2018-07-15 DIAGNOSIS — D473 Essential (hemorrhagic) thrombocythemia: Secondary | ICD-10-CM | POA: Diagnosis not present

## 2018-07-15 DIAGNOSIS — Z79899 Other long term (current) drug therapy: Secondary | ICD-10-CM | POA: Diagnosis not present

## 2018-07-15 DIAGNOSIS — S5011XA Contusion of right forearm, initial encounter: Secondary | ICD-10-CM | POA: Diagnosis not present

## 2018-07-15 DIAGNOSIS — M6281 Muscle weakness (generalized): Secondary | ICD-10-CM | POA: Diagnosis not present

## 2018-07-15 DIAGNOSIS — Z7401 Bed confinement status: Secondary | ICD-10-CM | POA: Diagnosis not present

## 2018-07-15 DIAGNOSIS — S8011XD Contusion of right lower leg, subsequent encounter: Secondary | ICD-10-CM | POA: Diagnosis not present

## 2018-07-15 DIAGNOSIS — Z9081 Acquired absence of spleen: Secondary | ICD-10-CM | POA: Diagnosis not present

## 2018-07-15 DIAGNOSIS — F0390 Unspecified dementia without behavioral disturbance: Secondary | ICD-10-CM | POA: Diagnosis not present

## 2018-07-15 DIAGNOSIS — L089 Local infection of the skin and subcutaneous tissue, unspecified: Secondary | ICD-10-CM | POA: Diagnosis not present

## 2018-07-15 DIAGNOSIS — L03113 Cellulitis of right upper limb: Secondary | ICD-10-CM | POA: Diagnosis not present

## 2018-07-15 DIAGNOSIS — R52 Pain, unspecified: Secondary | ICD-10-CM | POA: Diagnosis not present

## 2018-07-15 DIAGNOSIS — D693 Immune thrombocytopenic purpura: Secondary | ICD-10-CM | POA: Diagnosis not present

## 2018-07-15 DIAGNOSIS — E039 Hypothyroidism, unspecified: Secondary | ICD-10-CM | POA: Diagnosis not present

## 2018-07-15 DIAGNOSIS — E538 Deficiency of other specified B group vitamins: Secondary | ICD-10-CM | POA: Diagnosis not present

## 2018-07-15 DIAGNOSIS — G301 Alzheimer's disease with late onset: Secondary | ICD-10-CM | POA: Diagnosis not present

## 2018-07-15 DIAGNOSIS — W19XXXD Unspecified fall, subsequent encounter: Secondary | ICD-10-CM | POA: Diagnosis not present

## 2018-07-15 DIAGNOSIS — F0280 Dementia in other diseases classified elsewhere without behavioral disturbance: Secondary | ICD-10-CM | POA: Diagnosis not present

## 2018-07-15 LAB — BASIC METABOLIC PANEL
Anion gap: 5 (ref 5–15)
BUN: 22 mg/dL (ref 8–23)
CO2: 32 mmol/L (ref 22–32)
Calcium: 8.5 mg/dL — ABNORMAL LOW (ref 8.9–10.3)
Chloride: 106 mmol/L (ref 98–111)
Creatinine, Ser: 1 mg/dL (ref 0.44–1.00)
GFR calc Af Amer: 59 mL/min — ABNORMAL LOW (ref >60)
GFR calc non Af Amer: 51 mL/min — ABNORMAL LOW (ref >60)
Glucose, Bld: 113 mg/dL — ABNORMAL HIGH (ref 70–99)
POTASSIUM: 4.4 mmol/L (ref 3.5–5.1)
Sodium: 143 mmol/L (ref 135–145)

## 2018-07-15 LAB — CBC
HCT: 26.6 % — ABNORMAL LOW (ref 35.0–47.0)
Hemoglobin: 8.5 g/dL — ABNORMAL LOW (ref 12.0–16.0)
MCH: 27 pg (ref 26.0–34.0)
MCHC: 32 g/dL (ref 32.0–36.0)
MCV: 84.4 fL (ref 80.0–100.0)
Platelets: 72 10*3/uL — ABNORMAL LOW (ref 150–440)
RBC: 3.15 MIL/uL — ABNORMAL LOW (ref 3.80–5.20)
RDW: 19.6 % — ABNORMAL HIGH (ref 11.5–14.5)
WBC: 14.5 10*3/uL — ABNORMAL HIGH (ref 3.6–11.0)

## 2018-07-15 MED ORDER — DOXYCYCLINE HYCLATE 100 MG PO CAPS: 100.0000 mg | ORAL_CAPSULE | Freq: Two times a day (BID) | ORAL | 0 refills | Status: AC

## 2018-07-15 MED ORDER — ALPRAZOLAM 0.5 MG PO TABS: 0.5000 mg | ORAL_TABLET | Freq: Three times a day (TID) | ORAL | 0 refills | Status: DC | PRN

## 2018-07-15 MED ORDER — HYDROCODONE-ACETAMINOPHEN 7.5-325 MG PO TABS: 1.0000 | ORAL_TABLET | ORAL | 0 refills | Status: DC | PRN

## 2018-07-15 NOTE — Care Management Important Message (Signed)
Important Message  Patient Details  Name: Ariana [name] MRN: [id number] Date of Birth: [birth year]   Medicare Important Message Given:  Yes    [name] 07/15/2018, 11:33 AM

## 2018-07-15 NOTE — Clinical Social Work Placement (Signed)
   CLINICAL SOCIAL WORK PLACEMENT  NOTE  Date:  07/15/2018  Patient Details  Name: Ariana [name] MRN: [id number] Date of Birth: 1932-11-02  Clinical Social Work is seeking post-discharge placement for this patient at the [place] level of care (*CSW will initial, date and re-position this form in  chart as items are completed):  Yes   Patient/family provided with [facility] Work Department's list of facilities offering this level of care within the geographic area requested by the patient (or if unable, by the patient's family).  Yes   Patient/family informed of their freedom to choose among providers that offer the needed level of care, that participate in Medicare, Medicaid or managed care program needed by the patient, have an available bed and are willing to accept the patient.  Yes   Patient/family informed of [facility]'s ownership interest in [facility] and [facility], as well as of the fact that they are under no obligation to receive care at these facilities.  PASRR submitted to EDS on       PASRR number received on       Existing PASRR number confirmed on 07/13/18     FL2 transmitted to all facilities in geographic area requested by pt/family on 07/13/18     FL2 transmitted to all facilities within larger geographic area on       Patient informed that his/her managed care company has contracts with or will negotiate with certain facilities, including the following:        Yes   Patient/family informed of bed offers received.  Patient chooses bed at [facility] )     Physician recommends and patient chooses bed at      Patient to be transferred to [facility] ) on 07/15/18.  Patient to be transferred to facility by [facility] )     Patient family notified on 07/15/18 of transfer.  Name of family member notified:  (Patient's husband Ariana [name] is aware of D/C today. )     PHYSICIAN        Additional Comment:    _______________________________________________ Israel Werts, Veronia Beets, LCSW 07/15/2018, 9:59 AM

## 2018-07-15 NOTE — Progress Notes (Signed)
Paged wound care nurse.

## 2018-07-15 NOTE — Progress Notes (Signed)
Pharmacy Antibiotic Note  Ariana [name] is a 84 y.o. female admitted on 07/12/2018 with cellulitis.  Pharmacy has been consulted for cefazolin dosing.  Plan: Continue Cefazolin 1 g IV q8h  Weight: 174 lb (78.9 kg)  Temp (24hrs), Avg:98.2 F (36.8 C), Min:97.9 F (36.6 C), Max:98.6 F (37 C)  Recent Labs  Lab 07/11/18 1335 07/12/18 1231 07/13/18 0401 07/14/18 0414 07/15/18 0604  WBC 14.3* 16.4* 12.1* 16.5* 14.5*  CREATININE 1.02* 0.91 0.93  --  1.00    Estimated Creatinine Clearance: 45.5 mL/min (by C-G formula based on SCr of 1 mg/dL).    Allergies  Allergen Reactions  . Aspirin     Other reaction(s): Distress (finding)  . Diazepam     Other reaction(s): Itching of Skin sneezing  . Morphine     Other reaction(s): Itching of Skin  . Other     Other reaction(s): Unknown seasonal allergies  . Tetanus Toxoids     Other reaction(s): Localized superficial swelling of skin    Antimicrobials this admission: Ceftriaxone 10/1 x1 Cefazolin 10/2 >>  Dose adjustments this admission:   Microbiology results: Wcx 10/2 pending  Thank you for allowing pharmacy to be a part of this patient's care.  Freida Nebel M Dyon Rotert 07/15/2018 2:40 PM

## 2018-07-15 NOTE — Progress Notes (Signed)
Report called to [facility]. Pt will transport via ems. Husband at bedside. Wound care consult completed.

## 2018-07-15 NOTE — Plan of Care (Signed)
  Problem: Education: Goal: Knowledge of General Education information will improve Description: Including pain rating scale, medication(s)/side effects and non-pharmacologic comfort measures Outcome: Adequate for Discharge   Problem: Health Behavior/Discharge Planning: Goal: Ability to manage health-related needs will improve Outcome: Adequate for Discharge   Problem: Clinical Measurements: Goal: Ability to maintain clinical measurements within normal limits will improve Outcome: Adequate for Discharge Goal: Will remain free from infection Outcome: Adequate for Discharge Goal: Diagnostic test results will improve Outcome: Adequate for Discharge Goal: Respiratory complications will improve Outcome: Adequate for Discharge Goal: Cardiovascular complication will be avoided Outcome: Adequate for Discharge   Problem: Activity: Goal: Risk for activity intolerance will decrease Outcome: Adequate for Discharge   Problem: Nutrition: Goal: Adequate nutrition will be maintained Outcome: Adequate for Discharge   Problem: Coping: Goal: Level of anxiety will decrease Outcome: Adequate for Discharge   Problem: Elimination: Goal: Will not experience complications related to bowel motility Outcome: Adequate for Discharge Goal: Will not experience complications related to urinary retention Outcome: Adequate for Discharge   Problem: Pain Managment: Goal: General experience of comfort will improve Outcome: Adequate for Discharge   Problem: Safety: Goal: Ability to remain free from injury will improve Outcome: Adequate for Discharge   Problem: Skin Integrity: Goal: Risk for impaired skin integrity will decrease Outcome: Adequate for Discharge   Problem: Education: Goal: Required Educational Video(s) Outcome: Adequate for Discharge   Problem: Clinical Measurements: Goal: Postoperative complications will be avoided or minimized Outcome: Adequate for Discharge   Problem: Skin  Integrity: Goal: Demonstration of wound healing without infection will improve Outcome: Adequate for Discharge   

## 2018-07-15 NOTE — Consult Note (Signed)
[facility] Nurse wound consult note Reason for Consult: Traumatic hematoma to right anterior lower leg.  S/P debridement to this area.  Tender and painful during dressing change.  Vaseline, nonadherent gauze was used and no further trauma with dressing removal today. Minimal bleeding noted.  Wound type:trauma Pressure Injury POA: NA Measurement: 9 cm x 4 cm x 0.2 cm  Wound EBV:PLWUZ red, friable Drainage (amount, consistency, odor) moderate bleeding and serosanguinous drainage Periwound:intact  Thin skin to lower legs Dressing procedure/placement/frequency: Cleanse right anterior lower leg with NS.  Apply nonadherent dressing to wound bed.  Used silicone nonadherent contact layer Mepitel today. Top with silver hydrofiber, Aquacel Ag.  Cover with gauze and kerlix.  Change twice weekly.  Will not follow at this time.  Please re-consult if needed.  Domenic Moras MSN, RN, FNP-BC CWON Wound, Ostomy, Continence Nurse Pager [phone]

## 2018-07-15 NOTE — Progress Notes (Signed)
Patient is medically stable for D/C to [facility] today. Per [facility] admissions coordinator at [facility] authorization has been received and patient can come today to room 406. RN will call report and arrange EMS for transport. Clinical Education officer, museum (CSW) sent D/C orders to [facility] via [facility]. Patient is aware of above. CSW contacted patient's husband Kyung Rudd and made him aware of above. Please reconsult if future social work needs arise. CSW signing off.   [employer], LCSW [phone]

## 2018-07-15 NOTE — Progress Notes (Signed)
EMS called for transport.

## 2018-07-15 NOTE — Discharge Summary (Signed)
[facility] [place] NAME: Ariana [name]    MR#:  [id number]  DATE OF BIRTH:  02/01/1933  DATE OF ADMISSION:  07/12/2018   ADMITTING PHYSICIAN: Hillary Bow, MD  DATE OF DISCHARGE:  07/15/18  PRIMARY CARE PHYSICIAN: Idelle Crouch, MD   ADMISSION DIAGNOSIS:   Hematoma [T14.8XXA] Wound infection [T14.8XXA, L08.9]  DISCHARGE DIAGNOSIS:   Active Problems:   Traumatic hematoma of right lower leg   SECONDARY DIAGNOSIS:   Past Medical History:  Diagnosis Date  . Alzheimer disease ([place])   . Anemia   . Anxiety   . Asthma   . Chronic back pain   . Depression   . Depression   . GERD (gastroesophageal reflux disease)   . Hiatal hernia   . Hypercholesteremia   . ITP (idiopathic thrombocytopenic purpura)   . Osteoarthritis   . Osteoporosis   . Pulmonary emboli ([place])   . Restless legs     HOSPITAL COURSE:   84 year old female with past medical history significant for ITP, dementia, chronic anemia presents to hospital after a fall and right leg hematoma  1.  Infected right leg hematoma-appreciate surgery consult. -Patient had 16 cm x 6 cm excisional debridement of the infected hematoma down to the dermis. -Complains of significant pain.  Continue Silvadene ointment application with sterile dressing. -Dressing changes per wound care nurse notes.  Please send a copy of the notes with the discharge summary. -Continue pain control with pain meds, physical therapy -recommended rehab -Ancef for the infection.  Discharged on doxycycline -Cultures negative so far  2.  Depression and dementia-seems to be at baseline.  Pretty well oriented.  On Aricept, and Namenda.  Also on bupropion, Celexa  3.  Hypothyroidism-on Synthroid  4.  Chronic ITP-platelet count greater than 100.  On low-dose prednisone  5.  Hypoxia-likely poor inspiratory effort.    Improved with incentive spirometry.  Currently off of oxygen  All other home  medications are being continued.  Will be discharged to [facility] today  DISCHARGE CONDITIONS:   Guarded  CONSULTS OBTAINED:   Treatment Team:  Herbert Pun, MD  DRUG ALLERGIES:   Allergies  Allergen Reactions  . Aspirin     Other reaction(s): Distress (finding)  . Diazepam     Other reaction(s): Itching of Skin sneezing  . Morphine     Other reaction(s): Itching of Skin  . Other     Other reaction(s): Unknown seasonal allergies  . Tetanus Toxoids     Other reaction(s): Localized superficial swelling of skin   DISCHARGE MEDICATIONS:   Allergies as of 07/15/2018      Reactions   Aspirin    Other reaction(s): Distress (finding)   Diazepam    Other reaction(s): Itching of Skin sneezing   Morphine    Other reaction(s): Itching of Skin   Other    Other reaction(s): Unknown seasonal allergies   Tetanus Toxoids    Other reaction(s): Localized superficial swelling of skin      Medication List    STOP taking these medications   cephALEXin 500 MG capsule Commonly known as:  KEFLEX   prochlorperazine 10 MG tablet Commonly known as:  COMPAZINE   traMADol 50 MG tablet Commonly known as:  ULTRAM     TAKE these medications   acetaminophen 325 MG tablet Commonly known as:  TYLENOL Take 2 tablets (650 mg total) by mouth every 6 (six) hours as needed for mild pain (or Fever >/=  101).   albuterol 108 (90 Base) MCG/ACT inhaler Commonly known as:  PROVENTIL HFA;VENTOLIN HFA Inhale 2 puffs into the lungs every 6 (six) hours as needed for wheezing or shortness of breath.   ALPRAZolam 0.5 MG tablet Commonly known as:  XANAX Take 1 tablet (0.5 mg total) by mouth 3 (three) times daily as needed for anxiety.   buPROPion 150 MG 24 hr tablet Commonly known as:  WELLBUTRIN XL Take 150 mg by mouth daily.   CALCIUM 600+D 600-400 MG-UNIT tablet Generic drug:  Calcium Carbonate-Vitamin D Take 1 tablet by mouth daily.   citalopram 40 MG tablet Commonly  known as:  CELEXA Take 40 mg by mouth daily.   donepezil 5 MG tablet Commonly known as:  ARICEPT Take 10 mg by mouth at bedtime.   doxycycline 100 MG capsule Commonly known as:  VIBRAMYCIN Take 1 capsule (100 mg total) by mouth 2 (two) times daily for 10 days.   E-400 400 UNIT capsule Generic drug:  vitamin E Take 400 Units by mouth daily.   fluticasone 50 MCG/ACT nasal spray Commonly known as:  FLONASE Place 2 sprays into both nostrils daily.   Fluticasone-Salmeterol 250-50 MCG/DOSE Aepb Commonly known as:  ADVAIR Inhale 1 puff into the lungs daily as needed.   gabapentin 300 MG capsule Commonly known as:  NEURONTIN Take 300 mg by mouth 4 (four) times daily.   HYDROcodone-acetaminophen 7.5-325 MG tablet Commonly known as:  NORCO Take 1 tablet by mouth every 4 (four) hours as needed for moderate pain or severe pain. What changed:  when to take this   levothyroxine 50 MCG tablet Commonly known as:  SYNTHROID, LEVOTHROID Take 50 mcg by mouth daily before breakfast. Take 30 to 60 minutes before breakfast.   memantine 5 MG tablet Commonly known as:  NAMENDA Take 5 mg by mouth 2 (two) times daily.   montelukast 10 MG tablet Commonly known as:  SINGULAIR Take 10 mg by mouth at bedtime.   OCUVITE-LUTEIN PO Take 1 capsule by mouth daily. What changed:  Another medication with the same name was removed. Continue taking this medication, and follow the directions you see here.   oxybutynin 5 MG tablet Commonly known as:  DITROPAN Take 5 mg by mouth 3 (three) times daily.   pantoprazole 20 MG tablet Commonly known as:  PROTONIX Take 20 mg by mouth 2 (two) times daily.   pravastatin 20 MG tablet Commonly known as:  PRAVACHOL Take 20 mg by mouth daily.   predniSONE 10 MG tablet Commonly known as:  DELTASONE Take 10 mg by mouth daily.   RA VITAMIN B-12 TR 1000 MCG Tbcr Generic drug:  Cyanocobalamin Take 1,000 mcg by mouth daily.   vitamin C 500 MG tablet Commonly  known as:  ASCORBIC ACID Take 500 mg by mouth daily.        DISCHARGE INSTRUCTIONS:   1. PCP f/u in 1-2 weeks 2. Surgery f/u in 1-2 weeks  DIET:   Cardiac diet  ACTIVITY:   Activity as tolerated  OXYGEN:   Home Oxygen: No.  Oxygen Delivery: room air  DISCHARGE LOCATION:   nursing home   If you experience worsening of your admission symptoms, develop shortness of breath, life threatening emergency, suicidal or homicidal thoughts you must seek medical attention immediately by calling 911 or calling your MD immediately  if symptoms less severe.  You Must read complete instructions/literature along with all the possible adverse reactions/side effects for all the Medicines you take and that  have been prescribed to you. Take any new Medicines after you have completely understood and accpet all the possible adverse reactions/side effects.   Please note  You were cared for by a hospitalist during your hospital stay. If you have any questions about your discharge medications or the care you received while you were in the hospital after you are discharged, you can call the unit and asked to speak with the hospitalist on call if the hospitalist that took care of you is not available. Once you are discharged, your primary care physician will handle any further medical issues. Please note that NO REFILLS for any discharge medications will be authorized once you are discharged, as it is imperative that you return to your primary care physician (or establish a relationship with a primary care physician if you do not have one) for your aftercare needs so that they can reassess your need for medications and monitor your lab values.    On the day of Discharge:  VITAL SIGNS:   Blood pressure (!) 143/76, pulse 66, temperature 98.6 F (37 C), resp. rate 18, weight 78.9 kg, SpO2 100 %.  PHYSICAL EXAMINATION:   GENERAL:  84 y.o.-year-old patient lying in the bed with no acute distress.    EYES: Pupils equal, round, reactive to light and accommodation. No scleral icterus. Extraocular muscles intact.  HEENT: Head atraumatic, normocephalic. Oropharynx and nasopharynx clear.  NECK:  Supple, no jugular venous distention. No thyroid enlargement, no tenderness.  LUNGS: Normal breath sounds bilaterally, no wheezing, rales,rhonchi or crepitation. No use of accessory muscles of respiration.  CARDIOVASCULAR: S1, S2 normal. No  rubs, or gallops. 2/6 systolic murmur present  ABDOMEN: Soft, nontender, nondistended. Bowel sounds present. No organomegaly or mass.  EXTREMITIES: has chronic discoloration on both legs -Significantly erythematous right leg with bruising all the way to the inner thigh.  Dressing in place to the right leg.   No pedal edema, cyanosis, or clubbing.  NEUROLOGIC: Cranial nerves II through XII are intact. Muscle strength 5/5 in all extremities. Sensation intact. Gait not checked.  PSYCHIATRIC: The patient is alert and oriented x 3.  Some cognitive impairment noted SKIN: No obvious rash, lesion, or ulcer.   DATA REVIEW:   CBC Recent Labs  Lab 07/15/18 0604  WBC 14.5*  HGB 8.5*  HCT 26.6*  PLT 72*    Chemistries  Recent Labs  Lab 07/15/18 0604  NA 143  K 4.4  CL 106  CO2 32  GLUCOSE 113*  BUN 22  CREATININE 1.00  CALCIUM 8.5*     Microbiology Results  Results for orders placed or performed during the hospital encounter of 07/12/18  Aerobic/Anaerobic Culture (surgical/deep wound)     Status: None (Preliminary result)   Collection Time: 07/13/18 12:38 PM  Result Value Ref Range Status   Specimen Description   Final    WOUND Performed at [facility], [address], [place], [place] [address]    Special Requests   Final    NONE Performed at [facility], [place], [place] [address]    Gram Stain NO WBC SEEN NO ORGANISMS SEEN   Final   Culture   Final    NO GROWTH < 24 HOURS Performed at [facility], [place] [address] [place], [place] [address]    Report Status PENDING  Incomplete    RADIOLOGY:  No results found.   Management plans discussed with the patient, family and they are in agreement.  CODE STATUS:     Code Status Orders  (From admission, onward)         Start     Ordered   07/12/18 1611  Full code  Continuous     07/12/18 1612        Code Status History    Date Active Date Inactive Code Status Order ID Comments User Context   02/18/2017 1650 02/20/2017 1657 Full Code [id number]  Dustin Flock, MD Inpatient   06/29/2015 2212 07/02/2015 1817 Full Code [id number]  Gladstone Lighter, MD Inpatient      TOTAL TIME TAKING CARE OF THIS PATIENT: 38 minutes.    Codee Tutson M.D on 07/15/2018 at 9:43 AM  Between 7am to 6pm - Pager - [phone]  After 6pm go to [email or web address] - Proofreader  [facility]  Office  [phone]  CC: Primary care physician; Idelle Crouch, MD   Note: This dictation was prepared with Dragon dictation along with smaller phrase technology. Any transcriptional errors that result from this process are unintentional.

## 2018-07-17 NOTE — Progress Notes (Deleted)
[place]  [phone]  [phone]     Ariana [name] DOB: 05-17-1933  MR#: [id number]  WEX#:[id number]  Patient Care Team: Idelle Crouch, MD as PCP - General (Internal Medicine)  CHIEF COMPLAINT: Chronic refractory ITP  INTERVAL HISTORY: Patient returns to clinic today for further evaluation and continuation of Nplate.  She currently feels well. She admits to increasing fatigue over the past several weeks, but thinks this may be because her son recently had surgery and is currently in the ICU.  She does not report any increased bruising or bleeding over her baseline.  She has not had any recent falls.  She has no neurologic complaints. She denies any fevers. She has no chest pain or shortness of breath. She denies any nausea, vomiting, constipation, or diarrhea.  She has no melena or hematochezia.  She has no urinary complaints.  Patient offers no further specific complaints today.  REVIEW OF SYSTEMS:   Review of Systems  Constitutional: Negative.  Negative for fever, malaise/fatigue and weight loss.  HENT: Negative for congestion.   Respiratory: Negative.  Negative for cough, hemoptysis and shortness of breath.   Cardiovascular: Negative.  Negative for chest pain and leg swelling.  Gastrointestinal: Negative.  Negative for abdominal pain, blood in stool, diarrhea, melena and nausea.  Genitourinary: Negative.  Negative for dysuria and hematuria.  Musculoskeletal: Negative.  Negative for back pain.  Skin: Negative.  Negative for rash.  Neurological: Negative.  Negative for sensory change, focal weakness and weakness.  Endo/Heme/Allergies: Bruises/bleeds easily.  Psychiatric/Behavioral: Positive for memory loss. Negative for depression. The patient is not nervous/anxious.     As per HPI. Otherwise, a complete review of systems is negative.  PAST MEDICAL HISTORY: Past Medical History:  Diagnosis Date  . Alzheimer disease ([place])   .  Anemia   . Anxiety   . Asthma   . Chronic back pain   . Depression   . Depression   . GERD (gastroesophageal reflux disease)   . Hiatal hernia   . Hypercholesteremia   . ITP (idiopathic thrombocytopenic purpura)   . Osteoarthritis   . Osteoporosis   . Pulmonary emboli ([place])   . Restless legs     PAST SURGICAL HISTORY: Past Surgical History:  Procedure Laterality Date  . CARPAL TUNNEL RELEASE    . ESOPHAGOGASTRODUODENOSCOPY (EGD) WITH PROPOFOL N/A 09/10/2015   Procedure: ESOPHAGOGASTRODUODENOSCOPY (EGD) WITH PROPOFOL;  Surgeon: Lollie Sails, MD;  Location: [facility] ENDOSCOPY;  Service: Endoscopy;  Laterality: N/A;  . IRRIGATION AND DEBRIDEMENT HEMATOMA Right 07/13/2018   Procedure: IRRIGATION AND DEBRIDEMENT HEMATOMA-RIGHT SHIN;  Surgeon: Herbert Pun, MD;  Location: [facility] ORS;  Service: General;  Laterality: Right;  . LUMBAR DISC SURGERY    . SPLENECTOMY, PARTIAL    . TOTAL ABDOMINAL HYSTERECTOMY      FAMILY HISTORY Family History  Problem Relation Age of Onset  . Stroke Mother     GYNECOLOGIC HISTORY:  No LMP recorded. Patient is postmenopausal.     ADVANCED DIRECTIVES:    HEALTH MAINTENANCE: Social History   Tobacco Use  . Smoking status: Never Smoker  . Smokeless tobacco: Never Used  Substance Use Topics  . Alcohol use: No    Alcohol/week: 0.0 standard drinks  . Drug use: No     Allergies  Allergen Reactions  . Aspirin     Other reaction(s): Distress (finding)  . Diazepam     Other reaction(s): Itching of Skin sneezing  . Morphine  Other reaction(s): Itching of Skin  . Other     Other reaction(s): Unknown seasonal allergies  . Tetanus Toxoids     Other reaction(s): Localized superficial swelling of skin    Current Outpatient Medications  Medication Sig Dispense Refill  . acetaminophen (TYLENOL) 325 MG tablet Take 2 tablets (650 mg total) by mouth every 6 (six) hours as needed for mild pain (or Fever >/= 101). 100 tablet 0  .  albuterol (PROVENTIL HFA;VENTOLIN HFA) 108 (90 BASE) MCG/ACT inhaler Inhale 2 puffs into the lungs every 6 (six) hours as needed for wheezing or shortness of breath.    . ALPRAZolam (XANAX) 0.5 MG tablet Take 1 tablet (0.5 mg total) by mouth 3 (three) times daily as needed for anxiety. 20 tablet 0  . buPROPion (WELLBUTRIN XL) 150 MG 24 hr tablet Take 150 mg by mouth daily.    . Calcium Carbonate-Vitamin D (CALCIUM 600+D) 600-400 MG-UNIT per tablet Take 1 tablet by mouth daily.    . citalopram (CELEXA) 40 MG tablet Take 40 mg by mouth daily.    . Cyanocobalamin (RA VITAMIN B-12 TR) 1000 MCG TBCR Take 1,000 mcg by mouth daily.     Marland Kitchen donepezil (ARICEPT) 5 MG tablet Take 10 mg by mouth at bedtime.     Marland Kitchen doxycycline (VIBRAMYCIN) 100 MG capsule Take 1 capsule (100 mg total) by mouth 2 (two) times daily for 10 days. 20 capsule 0  . fluticasone (FLONASE) 50 MCG/ACT nasal spray Place 2 sprays into both nostrils daily.     . Fluticasone-Salmeterol (ADVAIR) 250-50 MCG/DOSE AEPB Inhale 1 puff into the lungs daily as needed.     . gabapentin (NEURONTIN) 300 MG capsule Take 300 mg by mouth 4 (four) times daily.    Marland Kitchen HYDROcodone-acetaminophen (NORCO) 7.5-325 MG tablet Take 1 tablet by mouth every 4 (four) hours as needed for moderate pain or severe pain. 25 tablet 0  . levothyroxine (SYNTHROID, LEVOTHROID) 50 MCG tablet Take 50 mcg by mouth daily before breakfast. Take 30 to 60 minutes before breakfast.    . memantine (NAMENDA) 5 MG tablet Take 5 mg by mouth 2 (two) times daily.     . montelukast (SINGULAIR) 10 MG tablet Take 10 mg by mouth at bedtime.     . Multiple Vitamins-Minerals (OCUVITE-LUTEIN PO) Take 1 capsule by mouth daily.     Marland Kitchen oxybutynin (DITROPAN) 5 MG tablet Take 5 mg by mouth 3 (three) times daily.    . pantoprazole (PROTONIX) 20 MG tablet Take 20 mg by mouth 2 (two) times daily.     . pravastatin (PRAVACHOL) 20 MG tablet Take 20 mg by mouth daily.    . predniSONE (DELTASONE) 10 MG tablet Take  10 mg by mouth daily.   1  . vitamin C (ASCORBIC ACID) 500 MG tablet Take 500 mg by mouth daily.    . vitamin E (E-400) 400 UNIT capsule Take 400 Units by mouth daily.     No current facility-administered medications for this visit.     OBJECTIVE: There were no vitals taken for this visit.   There is no height or weight on file to calculate BMI.    ECOG FS:1 - Symptomatic but completely ambulatory  General: Well-developed, well-nourished, no acute distress. Eyes: Pink conjunctiva, anicteric sclera. HEENT: Normocephalic, moist mucous membranes. Lungs: Clear to auscultation bilaterally. Heart: Regular rate and rhythm. No rubs, murmurs, or gallops. Abdomen: Soft, nontender, nondistended. No organomegaly noted, normoactive bowel sounds. Musculoskeletal: No edema, cyanosis, or clubbing. Neuro: Alert,  answering all questions appropriately. Cranial nerves grossly intact. Skin: No rashes or petechiae noted. Psych: Normal affect.   LAB RESULTS:  No visits with results within 3 Day(s) from this visit.  Latest known visit with results is:  Admission on 07/12/2018, Discharged on 07/15/2018  Component Date Value Ref Range Status  . WBC 07/12/2018 16.4* 3.6 - 11.0 K/uL Final  . RBC 07/12/2018 3.97  3.80 - 5.20 MIL/uL Final  . Hemoglobin 07/12/2018 10.6* 12.0 - 16.0 g/dL Final  . HCT 07/12/2018 32.9* 35.0 - 47.0 % Final  . MCV 07/12/2018 82.9  80.0 - 100.0 fL Final  . MCH 07/12/2018 26.7  26.0 - 34.0 pg Final  . MCHC 07/12/2018 32.2  32.0 - 36.0 g/dL Final  . RDW 07/12/2018 19.1* 11.5 - 14.5 % Final  . Platelets 07/12/2018 149* 150 - 440 K/uL Final   Comment: PLATELET COUNT CONFIRMED BY SMEAR GIANT PLATELETS SEEN   . Neutrophils Relative % 07/12/2018 64  % Final  . Neutro Abs 07/12/2018 10.5* 1.4 - 6.5 K/uL Final  . Lymphocytes Relative 07/12/2018 25  % Final  . Lymphs Abs 07/12/2018 4.0* 1.0 - 3.6 K/uL Final  . Monocytes Relative 07/12/2018 9  % Final  . Monocytes Absolute 07/12/2018  1.5* 0.2 - 0.9 K/uL Final  . Eosinophils Relative 07/12/2018 1  % Final  . Eosinophils Absolute 07/12/2018 0.2  0 - 0.7 K/uL Final  . Basophils Relative 07/12/2018 1  % Final  . Basophils Absolute 07/12/2018 0.2* 0 - 0.1 K/uL Final  . RBC Morphology 07/12/2018 MIXED RBC POPULATION   Final   Performed at [facility], [address], [place], [place] [address]  . Sodium 07/12/2018 139  135 - 145 mmol/L Final  . Potassium 07/12/2018 3.6  3.5 - 5.1 mmol/L Final  . Chloride 07/12/2018 105  98 - 111 mmol/L Final  . CO2 07/12/2018 26  22 - 32 mmol/L Final  . Glucose, Bld 07/12/2018 75  70 - 99 mg/dL Final  . BUN 07/12/2018 21  8 - 23 mg/dL Final  . Creatinine, Ser 07/12/2018 0.91  0.44 - 1.00 mg/dL Final  . Calcium 07/12/2018 8.9  8.9 - 10.3 mg/dL Final  . GFR calc non Af Amer 07/12/2018 57* >60 mL/min Final  . GFR calc Af Amer 07/12/2018 >60  >60 mL/min Final   Comment: (NOTE) The eGFR has been calculated using the CKD EPI equation. This calculation has not been validated in all clinical situations. eGFR's persistently <60 mL/min signify possible Chronic Kidney Disease.   Georgiann Hahn gap 07/12/2018 8  5 - 15 Final   Performed at [facility], [address] [place], [place] [address]  . Sodium 07/13/2018 140  135 - 145 mmol/L Final  . Potassium 07/13/2018 3.7  3.5 - 5.1 mmol/L Final  . Chloride 07/13/2018 106  98 - 111 mmol/L Final  . CO2 07/13/2018 28  22 - 32 mmol/L Final  . Glucose, Bld 07/13/2018 73  70 - 99 mg/dL Final  . BUN 07/13/2018 19  8 - 23 mg/dL Final  . Creatinine, Ser 07/13/2018 0.93  0.44 - 1.00 mg/dL Final  . Calcium 07/13/2018 8.6* 8.9 - 10.3 mg/dL Final  . GFR calc non Af Amer 07/13/2018 56* >60 mL/min Final  . GFR calc Af Amer 07/13/2018 >60  >60 mL/min Final   Comment: (NOTE) The eGFR has been calculated using the CKD EPI equation. This calculation has not been validated in all clinical situations. eGFR's persistently <60  mL/min signify possible  Chronic Kidney Disease.   Georgiann Hahn gap 07/13/2018 6  5 - 15 Final   Performed at [facility], [address] [place], [place] [address]  . WBC 07/13/2018 12.1* 3.6 - 11.0 K/uL Final  . RBC 07/13/2018 3.58* 3.80 - 5.20 MIL/uL Final  . Hemoglobin 07/13/2018 9.4* 12.0 - 16.0 g/dL Final  . HCT 07/13/2018 29.2* 35.0 - 47.0 % Final  . MCV 07/13/2018 81.6  80.0 - 100.0 fL Final  . MCH 07/13/2018 26.4  26.0 - 34.0 pg Final  . MCHC 07/13/2018 32.3  32.0 - 36.0 g/dL Final  . RDW 07/13/2018 19.4* 11.5 - 14.5 % Final  . Platelets 07/13/2018 129* 150 - 440 K/uL Final   Comment: PLATELET CLUMPS NOTED ON SMEAR, COUNT APPEARS ADEQUATE Performed at [facility], [address] [place], [place] [address]   . Specimen Description 07/13/2018    Final                   Value:WOUND Performed at [facility], [address] [place], [place] [address]   . Special Requests 07/13/2018    Final                   Value:NONE Performed at [facility], [address] [place], [place] [address]   . Gram Stain 07/13/2018    Final                   Value:NO WBC SEEN NO ORGANISMS SEEN   . Culture 07/13/2018    Final                   Value:NO GROWTH 4 DAYS NO ANAEROBES ISOLATED; CULTURE IN PROGRESS FOR 5 DAYS Performed at [facility], [place] [address] [place], [place] [address]   . Report Status 07/13/2018 PENDING   Incomplete  . WBC 07/14/2018 16.5* 3.6 - 11.0 K/uL Final  . RBC 07/14/2018 3.52* 3.80 - 5.20 MIL/uL Final  . Hemoglobin 07/14/2018 9.2* 12.0 - 16.0 g/dL Final  . HCT 07/14/2018 28.9* 35.0 - 47.0 % Final  . MCV 07/14/2018 81.9  80.0 - 100.0 fL Final  . MCH 07/14/2018 26.0  26.0 - 34.0 pg Final  . MCHC 07/14/2018 31.8* 32.0 - 36.0 g/dL Final  . RDW 07/14/2018 19.5* 11.5 - 14.5 % Final  . Platelets 07/14/2018 124* 150 - 440 K/uL Final   Comment: PLATELET CLUMPS NOTED ON SMEAR, COUNT APPEARS ADEQUATE GIANT PLATELETS SEEN Performed at [facility], [address] [place], [place] [address]   . WBC 07/15/2018 14.5* 3.6 - 11.0 K/uL Final  . RBC 07/15/2018 3.15* 3.80 - 5.20 MIL/uL Final  . Hemoglobin 07/15/2018 8.5* 12.0 - 16.0 g/dL Final  . HCT 07/15/2018 26.6* 35.0 - 47.0 % Final  . MCV 07/15/2018 84.4  80.0 - 100.0 fL Final  . MCH 07/15/2018 27.0  26.0 - 34.0 pg Final  . MCHC 07/15/2018 32.0  32.0 - 36.0 g/dL Final  . RDW 07/15/2018 19.6* 11.5 - 14.5 % Final  . Platelets 07/15/2018 72* 150 - 440 K/uL Final   Comment: RESULT REPEATED AND VERIFIED PLATELET CLUMPS NOTED ON SMEAR, COUNT APPEARS ADEQUATE Performed at [facility], [address], [place], [place] [address]   . Sodium 07/15/2018 143  135 - 145 mmol/L Final  . Potassium 07/15/2018 4.4  3.5 - 5.1 mmol/L Final  . Chloride 07/15/2018 106  98 - 111  mmol/L Final  . CO2 07/15/2018 32  22 - 32 mmol/L Final  . Glucose, Bld 07/15/2018 113* 70 - 99 mg/dL Final  . BUN 07/15/2018 22  8 - 23 mg/dL Final  . Creatinine, Ser 07/15/2018 1.00  0.44 - 1.00 mg/dL Final  . Calcium 07/15/2018 8.5* 8.9 - 10.3 mg/dL Final  . GFR calc non Af Amer 07/15/2018 51* >60 mL/min Final  . GFR calc Af Amer 07/15/2018 59* >60 mL/min Final   Comment: (NOTE) The eGFR has been calculated using the CKD EPI equation. This calculation has not been validated in all clinical situations. eGFR's persistently <60 mL/min signify possible Chronic Kidney Disease.   Georgiann Hahn gap 07/15/2018 5  5 - 15 Final   Performed at [facility], [address] [place], [place] [address]    STUDIES: No results found.  ASSESSMENT:  Chronic refractory ITP  PLAN:   1. Chronic refractory ITP: Patient had a poor response to Prednisone, WinRho, and Rituxan. She is status post splenectomy in 2007.  Patient's platelet count is decreased at 35 today, therefore we will proceed with 8 mcg/kg Nplate.  Patient had laboratory work 2 weeks after her last injection and her platelet count was noted to  increase to greater than 200.  Return to clinic in 4 weeks for further evaluation and continuation of treatment.  Can consider Promacta 12.'5mg'$  or Tavalisse 100 mg BID at a later date if necessary. Max dose of Nplate is 10 mcg/kg. 2. Weakness and fatigue: Chronic and unchanged.  Although she feels improved after receiving her injection. 3. Depression/dementia: Memory seems to be worse.  Continue follow-up with primary care. 4.  Increased bruising: Improved.  Increased dose of Nplate as above.  I spent a total of 30 minutes face-to-face with the patient of which greater than 50% of the visit was spent in counseling and coordination of care as detailed above.    Patient expressed understanding and was in agreement with this plan. She also understands that She can call clinic at any time with any questions, concerns, or complaints.    Lloyd Huger, MD   07/17/2018 10:41 PM

## 2018-07-18 ENCOUNTER — Inpatient Hospital Stay: Payer: Medicare HMO

## 2018-07-18 ENCOUNTER — Telehealth: Payer: Self-pay | Admitting: *Deleted

## 2018-07-18 ENCOUNTER — Inpatient Hospital Stay: Payer: Medicare HMO | Admitting: Oncology

## 2018-07-18 DIAGNOSIS — G301 Alzheimer's disease with late onset: Secondary | ICD-10-CM | POA: Diagnosis not present

## 2018-07-18 DIAGNOSIS — S5011XA Contusion of right forearm, initial encounter: Secondary | ICD-10-CM | POA: Diagnosis not present

## 2018-07-18 DIAGNOSIS — D473 Essential (hemorrhagic) thrombocythemia: Secondary | ICD-10-CM | POA: Diagnosis not present

## 2018-07-18 DIAGNOSIS — F0280 Dementia in other diseases classified elsewhere without behavioral disturbance: Secondary | ICD-10-CM | POA: Diagnosis not present

## 2018-07-18 DIAGNOSIS — S8011XA Contusion of right lower leg, initial encounter: Secondary | ICD-10-CM | POA: Diagnosis present

## 2018-07-18 DIAGNOSIS — L03113 Cellulitis of right upper limb: Secondary | ICD-10-CM | POA: Diagnosis not present

## 2018-07-18 LAB — AEROBIC/ANAEROBIC CULTURE W GRAM STAIN (SURGICAL/DEEP WOUND)

## 2018-07-18 LAB — SURGICAL PATHOLOGY

## 2018-07-18 LAB — AEROBIC/ANAEROBIC CULTURE (SURGICAL/DEEP WOUND)
CULTURE: NO GROWTH
GRAM STAIN: NONE SEEN

## 2018-07-18 SURGERY — Surgical Case
Anesthesia: *Unknown

## 2018-07-18 NOTE — Telephone Encounter (Signed)
Husband called to report that patient is in [facility]) and wants to know how she can get her injection today. Please advise if she should reschedule appointment and when.

## 2018-07-18 NOTE — Telephone Encounter (Signed)
Per Tillie Rung, husband is contactoing facility to see if they can transport her to appointment and the this will be rescheduled

## 2018-07-20 ENCOUNTER — Inpatient Hospital Stay: Payer: Medicare HMO

## 2018-07-20 ENCOUNTER — Encounter: Payer: Self-pay | Admitting: Oncology

## 2018-07-20 ENCOUNTER — Inpatient Hospital Stay (HOSPITAL_BASED_OUTPATIENT_CLINIC_OR_DEPARTMENT_OTHER): Payer: Medicare HMO | Admitting: Oncology

## 2018-07-20 ENCOUNTER — Inpatient Hospital Stay: Payer: Medicare HMO | Attending: Oncology

## 2018-07-20 VITALS — BP 111/65 | HR 71 | Temp 98.7°F | Resp 18 | Wt 147.6 lb

## 2018-07-20 DIAGNOSIS — F0280 Dementia in other diseases classified elsewhere without behavioral disturbance: Secondary | ICD-10-CM | POA: Insufficient documentation

## 2018-07-20 DIAGNOSIS — D693 Immune thrombocytopenic purpura: Secondary | ICD-10-CM

## 2018-07-20 DIAGNOSIS — Z9081 Acquired absence of spleen: Secondary | ICD-10-CM | POA: Diagnosis not present

## 2018-07-20 DIAGNOSIS — Z79899 Other long term (current) drug therapy: Secondary | ICD-10-CM | POA: Diagnosis not present

## 2018-07-20 LAB — CBC
HEMATOCRIT: 26.2 % — AB (ref 36.0–46.0)
HEMOGLOBIN: 8 g/dL — AB (ref 12.0–15.0)
MCH: 25.7 pg — ABNORMAL LOW (ref 26.0–34.0)
MCHC: 30.5 g/dL (ref 30.0–36.0)
MCV: 84.2 fL (ref 80.0–100.0)
NRBC: 0.3 % — AB (ref 0.0–0.2)
Platelets: 40 10*3/uL — ABNORMAL LOW (ref 150–400)
RBC: 3.11 MIL/uL — ABNORMAL LOW (ref 3.87–5.11)
RDW: 20 % — AB (ref 11.5–15.5)
WBC: 16.9 10*3/uL — AB (ref 4.0–10.5)

## 2018-07-20 MED ORDER — ROMIPLOSTIM INJECTION 500 MCG
8.0000 ug/kg | Freq: Once | SUBCUTANEOUS | Status: AC
  Administered 2018-07-20: 535 ug via SUBCUTANEOUS
  Filled 2018-07-20: qty 1

## 2018-07-20 NOTE — Progress Notes (Signed)
Pt in for follow up, residing at [facility] now.

## 2018-07-22 ENCOUNTER — Other Ambulatory Visit: Payer: Self-pay | Admitting: *Deleted

## 2018-07-22 DIAGNOSIS — F329 Major depressive disorder, single episode, unspecified: Secondary | ICD-10-CM | POA: Diagnosis not present

## 2018-07-22 DIAGNOSIS — S81801A Unspecified open wound, right lower leg, initial encounter: Secondary | ICD-10-CM | POA: Diagnosis not present

## 2018-07-22 DIAGNOSIS — D693 Immune thrombocytopenic purpura: Secondary | ICD-10-CM | POA: Diagnosis not present

## 2018-07-22 DIAGNOSIS — L97911 Non-pressure chronic ulcer of unspecified part of right lower leg limited to breakdown of skin: Secondary | ICD-10-CM | POA: Insufficient documentation

## 2018-07-22 DIAGNOSIS — F0390 Unspecified dementia without behavioral disturbance: Secondary | ICD-10-CM | POA: Diagnosis not present

## 2018-07-22 NOTE — Patient Outreach (Signed)
[facility]) Care Management  07/22/2018  Ariana [name] 31-Dec-1932 [id number]  Telephone Screen  Referral Date: 07/19/18  Referral Source: [facility] referral Tier 4 high Risk Patient List.  Referral Reason:Tier 4 high Risk Patient List.  Insurance: [place] attempt # 1 successful to her listed home number that is also her mobile number Patient is able to verify HIPAA Reviewed and addressed referral to [facility] with patient  [name] confirms with [facility] RN CM that she was not discharged home from the hospital but is at [facility] for rehab  [facility] RN CM apologized to [name], explained the referral for home [facility] services and discussed that she may receive a call from [place] after her discharge home. She voiced understanding and appreciation for the call to her   Conditions: HTN, asthma, hypothyroidism, DDD, chronic ITP, osteopenia, Bursitis, anxiety and depression , HLD, traumatic hematoma of RLE breathlessness on exertion,   Plan: [facility] RN CM will pend this patient for a return call within 4-7 business days  [facility] RN CM consulted with [facility] and [facility] at the facility, Bennetta Laos reports [name] is scheduled for discharge on next Monday, July 25 2018      Jerman Tinnon L. Lavina Hamman, RN, BSN, [place] Coordinator Office number [phone] Mobile number [phone]  Main [facility] number [phone] Fax number [phone]

## 2018-07-22 NOTE — Progress Notes (Signed)
[place]  [phone]) [phone]  Fax:(336) [phone]     Ariana [name] DOB: 1933/03/17  MR#: [id number]  PZW#:[id number]  Patient Care Team: Idelle Crouch, MD as PCP - General (Internal Medicine) Barbaraann Faster, RN as [place] Management  CHIEF COMPLAINT: Chronic refractory ITP  INTERVAL HISTORY: Patient returns to clinic today for further evaluation and continuation of Nplate.  She has increasing falls and poor memory and is currently at [employer].  She currently feels well and is asymptomatic.  History is difficult given her slowly worsening dementia.  She does not report any increased bruising or bleeding over her baseline. She has no neurologic complaints. She denies any fevers. She has no chest pain or shortness of breath. She denies any nausea, vomiting, constipation, or diarrhea.  She has no melena or hematochezia.  She has no urinary complaints.  Patient offers no specific complaints today.  REVIEW OF SYSTEMS:   Review of Systems  Constitutional: Negative.  Negative for fever, malaise/fatigue and weight loss.  HENT: Negative for congestion.   Respiratory: Negative.  Negative for cough, hemoptysis and shortness of breath.   Cardiovascular: Negative.  Negative for chest pain and leg swelling.  Gastrointestinal: Negative.  Negative for abdominal pain, blood in stool, diarrhea, melena and nausea.  Genitourinary: Negative.  Negative for dysuria and hematuria.  Musculoskeletal: Positive for falls. Negative for back pain.  Skin: Negative.  Negative for rash.  Neurological: Negative.  Negative for sensory change, focal weakness and weakness.  Endo/Heme/Allergies: Bruises/bleeds easily.  Psychiatric/Behavioral: Positive for memory loss. Negative for depression. The patient is not nervous/anxious.     As per HPI. Otherwise, a complete review of systems is negative.  PAST MEDICAL HISTORY: Past Medical History:  Diagnosis  Date  . Alzheimer disease ([place])   . Anemia   . Anxiety   . Asthma   . Chronic back pain   . Depression   . Depression   . GERD (gastroesophageal reflux disease)   . Hiatal hernia   . Hypercholesteremia   . ITP (idiopathic thrombocytopenic purpura)   . Osteoarthritis   . Osteoporosis   . Pulmonary emboli ([place])   . Restless legs     PAST SURGICAL HISTORY: Past Surgical History:  Procedure Laterality Date  . CARPAL TUNNEL RELEASE    . ESOPHAGOGASTRODUODENOSCOPY (EGD) WITH PROPOFOL N/A 09/10/2015   Procedure: ESOPHAGOGASTRODUODENOSCOPY (EGD) WITH PROPOFOL;  Surgeon: Lollie Sails, MD;  Location: [facility] ENDOSCOPY;  Service: Endoscopy;  Laterality: N/A;  . IRRIGATION AND DEBRIDEMENT HEMATOMA Right 07/13/2018   Procedure: IRRIGATION AND DEBRIDEMENT HEMATOMA-RIGHT SHIN;  Surgeon: Herbert Pun, MD;  Location: [facility] ORS;  Service: General;  Laterality: Right;  . LUMBAR DISC SURGERY    . SPLENECTOMY, PARTIAL    . TOTAL ABDOMINAL HYSTERECTOMY      FAMILY HISTORY Family History  Problem Relation Age of Onset  . Stroke Mother     GYNECOLOGIC HISTORY:  No LMP recorded. Patient is postmenopausal.     ADVANCED DIRECTIVES:    HEALTH MAINTENANCE: Social History   Tobacco Use  . Smoking status: Never Smoker  . Smokeless tobacco: Never Used  Substance Use Topics  . Alcohol use: No    Alcohol/week: 0.0 standard drinks  . Drug use: No     Allergies  Allergen Reactions  . Aspirin     Other reaction(s): Distress (finding)  . Diazepam     Other reaction(s): Itching of Skin sneezing  . Morphine  Other reaction(s): Itching of Skin  . Other     Other reaction(s): Unknown seasonal allergies  . Tetanus Toxoids     Other reaction(s): Localized superficial swelling of skin    Current Outpatient Medications  Medication Sig Dispense Refill  . acetaminophen (TYLENOL) 325 MG tablet Take 2 tablets (650 mg total) by mouth every 6 (six) hours as needed for mild pain (or  Fever >/= 101). 100 tablet 0  . albuterol (PROVENTIL HFA;VENTOLIN HFA) 108 (90 BASE) MCG/ACT inhaler Inhale 2 puffs into the lungs every 6 (six) hours as needed for wheezing or shortness of breath.    . ALPRAZolam (XANAX) 0.5 MG tablet Take 1 tablet (0.5 mg total) by mouth 3 (three) times daily as needed for anxiety. 20 tablet 0  . buPROPion (WELLBUTRIN XL) 150 MG 24 hr tablet Take 150 mg by mouth daily.    . Calcium Carbonate-Vitamin D (CALCIUM 600+D) 600-400 MG-UNIT per tablet Take 1 tablet by mouth daily.    . citalopram (CELEXA) 40 MG tablet Take 40 mg by mouth daily.    . Cyanocobalamin (RA VITAMIN B-12 TR) 1000 MCG TBCR Take 1,000 mcg by mouth daily.     Marland Kitchen donepezil (ARICEPT) 5 MG tablet Take 10 mg by mouth at bedtime.     Marland Kitchen doxycycline (VIBRAMYCIN) 100 MG capsule Take 1 capsule (100 mg total) by mouth 2 (two) times daily for 10 days. 20 capsule 0  . fluticasone (FLONASE) 50 MCG/ACT nasal spray Place 2 sprays into both nostrils daily.     . Fluticasone-Salmeterol (ADVAIR) 250-50 MCG/DOSE AEPB Inhale 1 puff into the lungs daily as needed.     . gabapentin (NEURONTIN) 300 MG capsule Take 300 mg by mouth 4 (four) times daily.    Marland Kitchen HYDROcodone-acetaminophen (NORCO) 7.5-325 MG tablet Take 1 tablet by mouth every 4 (four) hours as needed for moderate pain or severe pain. 25 tablet 0  . levothyroxine (SYNTHROID, LEVOTHROID) 50 MCG tablet Take 50 mcg by mouth daily before breakfast. Take 30 to 60 minutes before breakfast.    . montelukast (SINGULAIR) 10 MG tablet Take 10 mg by mouth at bedtime.     . Multiple Vitamins-Minerals (OCUVITE-LUTEIN PO) Take 1 capsule by mouth daily.     Marland Kitchen oxybutynin (DITROPAN) 5 MG tablet Take 5 mg by mouth 3 (three) times daily.    . pantoprazole (PROTONIX) 20 MG tablet Take 20 mg by mouth 2 (two) times daily.     . pravastatin (PRAVACHOL) 20 MG tablet Take 20 mg by mouth daily.    . predniSONE (DELTASONE) 10 MG tablet Take 10 mg by mouth daily.   1  . vitamin C  (ASCORBIC ACID) 500 MG tablet Take 500 mg by mouth daily.    . vitamin E (E-400) 400 UNIT capsule Take 400 Units by mouth daily.    . memantine (NAMENDA) 5 MG tablet Take 5 mg by mouth 2 (two) times daily.      No current facility-administered medications for this visit.     OBJECTIVE: BP 111/65 (BP Location: Left Arm, Patient Position: Sitting)   Pulse 71   Temp 98.7 F (37.1 C) (Tympanic)   Resp 18   Wt 147 lb 9 oz (66.9 kg)   BMI 24.18 kg/m    Body mass index is 24.18 kg/m.    ECOG FS:1 - Symptomatic but completely ambulatory  General: Well-developed, well-nourished, no acute distress. Eyes: Pink conjunctiva, anicteric sclera. HEENT: Normocephalic, moist mucous membranes. Lungs: Clear to auscultation bilaterally. Heart:  Regular rate and rhythm. No rubs, murmurs, or gallops. Abdomen: Soft, nontender, nondistended. No organomegaly noted, normoactive bowel sounds. Musculoskeletal: No edema, cyanosis, or clubbing. Neuro: Alert, mildly confused. Cranial nerves grossly intact. Skin: No rashes or petechiae noted.  Bruising on bilateral arms. Psych: Normal affect.    LAB RESULTS:  Appointment on 07/20/2018  Component Date Value Ref Range Status  . WBC 07/20/2018 16.9* 4.0 - 10.5 K/uL Final  . RBC 07/20/2018 3.11* 3.87 - 5.11 MIL/uL Final  . Hemoglobin 07/20/2018 8.0* 12.0 - 15.0 g/dL Final  . HCT 07/20/2018 26.2* 36.0 - 46.0 % Final  . MCV 07/20/2018 84.2  80.0 - 100.0 fL Final  . MCH 07/20/2018 25.7* 26.0 - 34.0 pg Final  . MCHC 07/20/2018 30.5  30.0 - 36.0 g/dL Final  . RDW 07/20/2018 20.0* 11.5 - 15.5 % Final  . Platelets 07/20/2018 40* 150 - 400 K/uL Final   Comment: REPEATED TO VERIFY SPECIMEN CHECKED FOR CLOTS Immature Platelet Fraction may be clinically indicated, consider ordering this additional test [id number]   . nRBC 07/20/2018 0.3* 0.0 - 0.2 % Final   Performed at [facility], [address] [place], [place] [address]    STUDIES: No results  found.  ASSESSMENT:  Chronic refractory ITP  PLAN:   1. Chronic refractory ITP: Patient had a poor response to Prednisone, WinRho, and Rituxan. She is status post splenectomy in 2007.  Patient's platelet count is decreased at 40 today, therefore we will proceed with 8 mcg/kg Nplate.  Return to clinic in 4 weeks for repeat laboratory work, further evaluation, and continuation of treatment.  Can consider Promacta 12.5mg  or Tavalisse 100 mg BID at a later date if necessary. Max dose of Nplate is 10 mcg/kg. 2. Weakness and fatigue: Chronic and unchanged.   3. Depression/dementia: Memory seems to be worse.  Patient is now at [employer]. 4.  Falls.  Likely multifactorial.  Patient expressed understanding and was in agreement with this plan. She also understands that She can call clinic at any time with any questions, concerns, or complaints.    Lloyd Huger, MD   07/22/2018 1:05 PM

## 2018-07-26 ENCOUNTER — Ambulatory Visit: Payer: Self-pay | Admitting: *Deleted

## 2018-07-26 DIAGNOSIS — F0280 Dementia in other diseases classified elsewhere without behavioral disturbance: Secondary | ICD-10-CM | POA: Diagnosis not present

## 2018-07-26 DIAGNOSIS — G2581 Restless legs syndrome: Secondary | ICD-10-CM | POA: Diagnosis not present

## 2018-07-26 DIAGNOSIS — M549 Dorsalgia, unspecified: Secondary | ICD-10-CM | POA: Diagnosis not present

## 2018-07-26 DIAGNOSIS — G309 Alzheimer's disease, unspecified: Secondary | ICD-10-CM | POA: Diagnosis not present

## 2018-07-26 DIAGNOSIS — M810 Age-related osteoporosis without current pathological fracture: Secondary | ICD-10-CM | POA: Diagnosis not present

## 2018-07-26 DIAGNOSIS — J45909 Unspecified asthma, uncomplicated: Secondary | ICD-10-CM | POA: Diagnosis not present

## 2018-07-26 DIAGNOSIS — M1990 Unspecified osteoarthritis, unspecified site: Secondary | ICD-10-CM | POA: Diagnosis not present

## 2018-07-26 DIAGNOSIS — S8011XA Contusion of right lower leg, initial encounter: Secondary | ICD-10-CM | POA: Diagnosis not present

## 2018-07-26 DIAGNOSIS — G8929 Other chronic pain: Secondary | ICD-10-CM | POA: Diagnosis not present

## 2018-07-28 DIAGNOSIS — M1990 Unspecified osteoarthritis, unspecified site: Secondary | ICD-10-CM | POA: Diagnosis not present

## 2018-07-28 DIAGNOSIS — G2581 Restless legs syndrome: Secondary | ICD-10-CM | POA: Diagnosis not present

## 2018-07-28 DIAGNOSIS — M549 Dorsalgia, unspecified: Secondary | ICD-10-CM | POA: Diagnosis not present

## 2018-07-28 DIAGNOSIS — G309 Alzheimer's disease, unspecified: Secondary | ICD-10-CM | POA: Diagnosis not present

## 2018-07-28 DIAGNOSIS — G8929 Other chronic pain: Secondary | ICD-10-CM | POA: Diagnosis not present

## 2018-07-28 DIAGNOSIS — S8011XA Contusion of right lower leg, initial encounter: Secondary | ICD-10-CM | POA: Diagnosis not present

## 2018-07-28 DIAGNOSIS — J45909 Unspecified asthma, uncomplicated: Secondary | ICD-10-CM | POA: Diagnosis not present

## 2018-07-28 DIAGNOSIS — F0280 Dementia in other diseases classified elsewhere without behavioral disturbance: Secondary | ICD-10-CM | POA: Diagnosis not present

## 2018-07-28 DIAGNOSIS — M810 Age-related osteoporosis without current pathological fracture: Secondary | ICD-10-CM | POA: Diagnosis not present

## 2018-07-29 DIAGNOSIS — G309 Alzheimer's disease, unspecified: Secondary | ICD-10-CM | POA: Diagnosis not present

## 2018-07-29 DIAGNOSIS — M1990 Unspecified osteoarthritis, unspecified site: Secondary | ICD-10-CM | POA: Diagnosis not present

## 2018-07-29 DIAGNOSIS — F0280 Dementia in other diseases classified elsewhere without behavioral disturbance: Secondary | ICD-10-CM | POA: Diagnosis not present

## 2018-07-29 DIAGNOSIS — J45909 Unspecified asthma, uncomplicated: Secondary | ICD-10-CM | POA: Diagnosis not present

## 2018-07-29 DIAGNOSIS — S8011XA Contusion of right lower leg, initial encounter: Secondary | ICD-10-CM | POA: Diagnosis not present

## 2018-07-29 DIAGNOSIS — M810 Age-related osteoporosis without current pathological fracture: Secondary | ICD-10-CM | POA: Diagnosis not present

## 2018-07-29 DIAGNOSIS — G2581 Restless legs syndrome: Secondary | ICD-10-CM | POA: Diagnosis not present

## 2018-07-29 DIAGNOSIS — G8929 Other chronic pain: Secondary | ICD-10-CM | POA: Diagnosis not present

## 2018-07-29 DIAGNOSIS — M549 Dorsalgia, unspecified: Secondary | ICD-10-CM | POA: Diagnosis not present

## 2018-08-02 DIAGNOSIS — F0280 Dementia in other diseases classified elsewhere without behavioral disturbance: Secondary | ICD-10-CM | POA: Diagnosis not present

## 2018-08-02 DIAGNOSIS — M549 Dorsalgia, unspecified: Secondary | ICD-10-CM | POA: Diagnosis not present

## 2018-08-02 DIAGNOSIS — M1990 Unspecified osteoarthritis, unspecified site: Secondary | ICD-10-CM | POA: Diagnosis not present

## 2018-08-02 DIAGNOSIS — G309 Alzheimer's disease, unspecified: Secondary | ICD-10-CM | POA: Diagnosis not present

## 2018-08-02 DIAGNOSIS — J45909 Unspecified asthma, uncomplicated: Secondary | ICD-10-CM | POA: Diagnosis not present

## 2018-08-02 DIAGNOSIS — S8011XA Contusion of right lower leg, initial encounter: Secondary | ICD-10-CM | POA: Diagnosis not present

## 2018-08-02 DIAGNOSIS — M810 Age-related osteoporosis without current pathological fracture: Secondary | ICD-10-CM | POA: Diagnosis not present

## 2018-08-02 DIAGNOSIS — G2581 Restless legs syndrome: Secondary | ICD-10-CM | POA: Diagnosis not present

## 2018-08-02 DIAGNOSIS — G8929 Other chronic pain: Secondary | ICD-10-CM | POA: Diagnosis not present

## 2018-08-03 DIAGNOSIS — F0280 Dementia in other diseases classified elsewhere without behavioral disturbance: Secondary | ICD-10-CM | POA: Diagnosis not present

## 2018-08-03 DIAGNOSIS — J45909 Unspecified asthma, uncomplicated: Secondary | ICD-10-CM | POA: Diagnosis not present

## 2018-08-03 DIAGNOSIS — G2581 Restless legs syndrome: Secondary | ICD-10-CM | POA: Diagnosis not present

## 2018-08-03 DIAGNOSIS — M1990 Unspecified osteoarthritis, unspecified site: Secondary | ICD-10-CM | POA: Diagnosis not present

## 2018-08-03 DIAGNOSIS — M810 Age-related osteoporosis without current pathological fracture: Secondary | ICD-10-CM | POA: Diagnosis not present

## 2018-08-03 DIAGNOSIS — G8929 Other chronic pain: Secondary | ICD-10-CM | POA: Diagnosis not present

## 2018-08-03 DIAGNOSIS — M549 Dorsalgia, unspecified: Secondary | ICD-10-CM | POA: Diagnosis not present

## 2018-08-03 DIAGNOSIS — G309 Alzheimer's disease, unspecified: Secondary | ICD-10-CM | POA: Diagnosis not present

## 2018-08-03 DIAGNOSIS — S8011XA Contusion of right lower leg, initial encounter: Secondary | ICD-10-CM | POA: Diagnosis not present

## 2018-08-04 DIAGNOSIS — G309 Alzheimer's disease, unspecified: Secondary | ICD-10-CM | POA: Diagnosis not present

## 2018-08-04 DIAGNOSIS — J45909 Unspecified asthma, uncomplicated: Secondary | ICD-10-CM | POA: Diagnosis not present

## 2018-08-04 DIAGNOSIS — G2581 Restless legs syndrome: Secondary | ICD-10-CM | POA: Diagnosis not present

## 2018-08-04 DIAGNOSIS — G8929 Other chronic pain: Secondary | ICD-10-CM | POA: Diagnosis not present

## 2018-08-04 DIAGNOSIS — M810 Age-related osteoporosis without current pathological fracture: Secondary | ICD-10-CM | POA: Diagnosis not present

## 2018-08-04 DIAGNOSIS — R300 Dysuria: Secondary | ICD-10-CM | POA: Diagnosis not present

## 2018-08-04 DIAGNOSIS — M1990 Unspecified osteoarthritis, unspecified site: Secondary | ICD-10-CM | POA: Diagnosis not present

## 2018-08-04 DIAGNOSIS — F0280 Dementia in other diseases classified elsewhere without behavioral disturbance: Secondary | ICD-10-CM | POA: Diagnosis not present

## 2018-08-04 DIAGNOSIS — S8011XA Contusion of right lower leg, initial encounter: Secondary | ICD-10-CM | POA: Diagnosis not present

## 2018-08-04 DIAGNOSIS — M549 Dorsalgia, unspecified: Secondary | ICD-10-CM | POA: Diagnosis not present

## 2018-08-08 DIAGNOSIS — M5136 Other intervertebral disc degeneration, lumbar region: Secondary | ICD-10-CM | POA: Diagnosis not present

## 2018-08-08 DIAGNOSIS — D696 Thrombocytopenia, unspecified: Secondary | ICD-10-CM | POA: Diagnosis not present

## 2018-08-08 DIAGNOSIS — M5416 Radiculopathy, lumbar region: Secondary | ICD-10-CM | POA: Diagnosis not present

## 2018-08-08 DIAGNOSIS — M810 Age-related osteoporosis without current pathological fracture: Secondary | ICD-10-CM | POA: Diagnosis not present

## 2018-08-08 DIAGNOSIS — M62830 Muscle spasm of back: Secondary | ICD-10-CM | POA: Diagnosis not present

## 2018-08-08 DIAGNOSIS — M1990 Unspecified osteoarthritis, unspecified site: Secondary | ICD-10-CM | POA: Diagnosis not present

## 2018-08-08 DIAGNOSIS — F0280 Dementia in other diseases classified elsewhere without behavioral disturbance: Secondary | ICD-10-CM | POA: Diagnosis not present

## 2018-08-08 DIAGNOSIS — S8011XA Contusion of right lower leg, initial encounter: Secondary | ICD-10-CM | POA: Diagnosis not present

## 2018-08-08 DIAGNOSIS — J45909 Unspecified asthma, uncomplicated: Secondary | ICD-10-CM | POA: Diagnosis not present

## 2018-08-08 DIAGNOSIS — M549 Dorsalgia, unspecified: Secondary | ICD-10-CM | POA: Diagnosis not present

## 2018-08-08 DIAGNOSIS — G309 Alzheimer's disease, unspecified: Secondary | ICD-10-CM | POA: Diagnosis not present

## 2018-08-08 DIAGNOSIS — G8929 Other chronic pain: Secondary | ICD-10-CM | POA: Diagnosis not present

## 2018-08-09 DIAGNOSIS — M1990 Unspecified osteoarthritis, unspecified site: Secondary | ICD-10-CM | POA: Diagnosis not present

## 2018-08-09 DIAGNOSIS — M810 Age-related osteoporosis without current pathological fracture: Secondary | ICD-10-CM | POA: Diagnosis not present

## 2018-08-09 DIAGNOSIS — G309 Alzheimer's disease, unspecified: Secondary | ICD-10-CM | POA: Diagnosis not present

## 2018-08-09 DIAGNOSIS — G2581 Restless legs syndrome: Secondary | ICD-10-CM | POA: Diagnosis not present

## 2018-08-09 DIAGNOSIS — S8011XA Contusion of right lower leg, initial encounter: Secondary | ICD-10-CM | POA: Diagnosis not present

## 2018-08-09 DIAGNOSIS — M549 Dorsalgia, unspecified: Secondary | ICD-10-CM | POA: Diagnosis not present

## 2018-08-09 DIAGNOSIS — J45909 Unspecified asthma, uncomplicated: Secondary | ICD-10-CM | POA: Diagnosis not present

## 2018-08-09 DIAGNOSIS — G8929 Other chronic pain: Secondary | ICD-10-CM | POA: Diagnosis not present

## 2018-08-09 DIAGNOSIS — F0280 Dementia in other diseases classified elsewhere without behavioral disturbance: Secondary | ICD-10-CM | POA: Diagnosis not present

## 2018-08-11 ENCOUNTER — Other Ambulatory Visit: Payer: Self-pay | Admitting: *Deleted

## 2018-08-11 DIAGNOSIS — G309 Alzheimer's disease, unspecified: Secondary | ICD-10-CM | POA: Diagnosis not present

## 2018-08-11 DIAGNOSIS — S8011XA Contusion of right lower leg, initial encounter: Secondary | ICD-10-CM | POA: Diagnosis not present

## 2018-08-11 DIAGNOSIS — G2581 Restless legs syndrome: Secondary | ICD-10-CM | POA: Diagnosis not present

## 2018-08-11 DIAGNOSIS — M1990 Unspecified osteoarthritis, unspecified site: Secondary | ICD-10-CM | POA: Diagnosis not present

## 2018-08-11 DIAGNOSIS — G8929 Other chronic pain: Secondary | ICD-10-CM | POA: Diagnosis not present

## 2018-08-11 DIAGNOSIS — M810 Age-related osteoporosis without current pathological fracture: Secondary | ICD-10-CM | POA: Diagnosis not present

## 2018-08-11 DIAGNOSIS — F0280 Dementia in other diseases classified elsewhere without behavioral disturbance: Secondary | ICD-10-CM | POA: Diagnosis not present

## 2018-08-11 DIAGNOSIS — J45909 Unspecified asthma, uncomplicated: Secondary | ICD-10-CM | POA: Diagnosis not present

## 2018-08-11 DIAGNOSIS — M549 Dorsalgia, unspecified: Secondary | ICD-10-CM | POA: Diagnosis not present

## 2018-08-11 NOTE — Patient Outreach (Signed)
[facility]) Care Management  08/11/2018  Ariana [name] Apr 19, 1933 [id number]   Telephone Screen  Referral Date: 07/19/18  Referral Source: [facility] referral Tier 4 high Risk Patient List.  Referral Reason:Tier 4 high Risk Patient List.  Insurance: [employer] attempt # 2 successful to her listed home number that is also her mobile number Patient is able to verify HIPAA today  Reviewed and addressed referral to [facility] patient and Transition of care assessment completed  She reports she is having "a good day today. I have had visitors."  Mrs Bring confirms her d/c from [facility] for rehab went well and she has followed up with her primary MD, Dr Doy Hutching  She could not recall if she received discharge instructions and took the time to ask her husband, Ariana [name], who reminded her she did get d/c instructions and he reviewed them.  She had to ask Mr Kelner the date she saw her MD and he replied on 08/05/18 She was able to tell CM that she continues to be followed by [facility] ans is now using her cane for mobility DME cane  Fall She states over the last year she has fallen 3-5 times She states she did not fall before being hospitialized and never had to have medical tx for her 3-5 falls   She confirms she was hospitalized for a wound infection on her le She now reports "It is drying up and almost gone away"  She spoke with Cm about her concerns with her son, Ariana [name] She voiced her concern about being aware that she is having memory issues. She confirms she is being seen by a neurologist but every three months now. She feels it may need to be more. Cm discussed with her that she can call her neurologist to discuss this concern to see if she can be seen earlier or more frequent  She voiced appreciation for the call to her   Conditions: HTN, asthma, hypothyroidism, DDD, chronic ITP, osteopenia, Bursitis, anxiety and  depression , HLD, traumatic hematoma of RLE breathlessness on exertion, alzheimer disease, anemia  She denies need of services from [facility]/Telephonic RN CM, pharmacy, health coach, NP or SW at this time  Plan: [place] CM will close case at this time as patient has been assessed and no needs identified.   [facility] RN CM sent a successful outreach letter as discussed with [facility] brochure enclosed for review  Pt encouraged to return a call to [facility] RN CM prn  Jolinda Pinkstaff L. Lavina Hamman, RN, BSN, [place] Coordinator Office number [phone] Mobile number [phone]  Main [facility] number [phone] Fax number [phone]

## 2018-08-17 DIAGNOSIS — G8929 Other chronic pain: Secondary | ICD-10-CM | POA: Diagnosis not present

## 2018-08-17 DIAGNOSIS — G2581 Restless legs syndrome: Secondary | ICD-10-CM | POA: Diagnosis not present

## 2018-08-17 DIAGNOSIS — J45909 Unspecified asthma, uncomplicated: Secondary | ICD-10-CM | POA: Diagnosis not present

## 2018-08-17 DIAGNOSIS — S8011XA Contusion of right lower leg, initial encounter: Secondary | ICD-10-CM | POA: Diagnosis not present

## 2018-08-17 DIAGNOSIS — F0280 Dementia in other diseases classified elsewhere without behavioral disturbance: Secondary | ICD-10-CM | POA: Diagnosis not present

## 2018-08-17 DIAGNOSIS — M1990 Unspecified osteoarthritis, unspecified site: Secondary | ICD-10-CM | POA: Diagnosis not present

## 2018-08-17 DIAGNOSIS — G309 Alzheimer's disease, unspecified: Secondary | ICD-10-CM | POA: Diagnosis not present

## 2018-08-17 DIAGNOSIS — M549 Dorsalgia, unspecified: Secondary | ICD-10-CM | POA: Diagnosis not present

## 2018-08-17 DIAGNOSIS — M810 Age-related osteoporosis without current pathological fracture: Secondary | ICD-10-CM | POA: Diagnosis not present

## 2018-08-19 DIAGNOSIS — M549 Dorsalgia, unspecified: Secondary | ICD-10-CM | POA: Diagnosis not present

## 2018-08-19 DIAGNOSIS — M1990 Unspecified osteoarthritis, unspecified site: Secondary | ICD-10-CM | POA: Diagnosis not present

## 2018-08-19 DIAGNOSIS — G309 Alzheimer's disease, unspecified: Secondary | ICD-10-CM | POA: Diagnosis not present

## 2018-08-19 DIAGNOSIS — G8929 Other chronic pain: Secondary | ICD-10-CM | POA: Diagnosis not present

## 2018-08-19 DIAGNOSIS — F0280 Dementia in other diseases classified elsewhere without behavioral disturbance: Secondary | ICD-10-CM | POA: Diagnosis not present

## 2018-08-19 DIAGNOSIS — G2581 Restless legs syndrome: Secondary | ICD-10-CM | POA: Diagnosis not present

## 2018-08-19 DIAGNOSIS — M810 Age-related osteoporosis without current pathological fracture: Secondary | ICD-10-CM | POA: Diagnosis not present

## 2018-08-19 DIAGNOSIS — J45909 Unspecified asthma, uncomplicated: Secondary | ICD-10-CM | POA: Diagnosis not present

## 2018-08-19 DIAGNOSIS — S8011XA Contusion of right lower leg, initial encounter: Secondary | ICD-10-CM | POA: Diagnosis not present

## 2018-08-21 NOTE — Progress Notes (Deleted)
[place]  Telephone:(336) [phone]  Fax:(336) [phone]     Ariana [name] DOB: 12-Feb-1933  MR#: [id number]  MVE#:[id number]  Patient Care Team: Idelle Crouch, MD as PCP - General (Internal Medicine)  CHIEF COMPLAINT: Chronic refractory ITP  INTERVAL HISTORY: Patient returns to clinic today for further evaluation and continuation of Nplate.  She has increasing falls and poor memory and is currently at [employer].  She currently feels well and is asymptomatic.  History is difficult given her slowly worsening dementia.  She does not report any increased bruising or bleeding over her baseline. She has no neurologic complaints. She denies any fevers. She has no chest pain or shortness of breath. She denies any nausea, vomiting, constipation, or diarrhea.  She has no melena or hematochezia.  She has no urinary complaints.  Patient offers no specific complaints today.  REVIEW OF SYSTEMS:   Review of Systems  Constitutional: Negative.  Negative for fever, malaise/fatigue and weight loss.  HENT: Negative for congestion.   Respiratory: Negative.  Negative for cough, hemoptysis and shortness of breath.   Cardiovascular: Negative.  Negative for chest pain and leg swelling.  Gastrointestinal: Negative.  Negative for abdominal pain, blood in stool, diarrhea, melena and nausea.  Genitourinary: Negative.  Negative for dysuria and hematuria.  Musculoskeletal: Positive for falls. Negative for back pain.  Skin: Negative.  Negative for rash.  Neurological: Negative.  Negative for sensory change, focal weakness and weakness.  Endo/Heme/Allergies: Bruises/bleeds easily.  Psychiatric/Behavioral: Positive for memory loss. Negative for depression. The patient is not nervous/anxious.     As per HPI. Otherwise, a complete review of systems is negative.  PAST MEDICAL HISTORY: Past Medical History:  Diagnosis Date  . Alzheimer disease ([place])   . Anemia   . Anxiety   .  Asthma   . Chronic back pain   . Depression   . Depression   . GERD (gastroesophageal reflux disease)   . Hiatal hernia   . Hypercholesteremia   . ITP (idiopathic thrombocytopenic purpura)   . Osteoarthritis   . Osteoporosis   . Pulmonary emboli ([place])   . Restless legs     PAST SURGICAL HISTORY: Past Surgical History:  Procedure Laterality Date  . CARPAL TUNNEL RELEASE    . ESOPHAGOGASTRODUODENOSCOPY (EGD) WITH PROPOFOL N/A 09/10/2015   Procedure: ESOPHAGOGASTRODUODENOSCOPY (EGD) WITH PROPOFOL;  Surgeon: Lollie Sails, MD;  Location: [facility] ENDOSCOPY;  Service: Endoscopy;  Laterality: N/A;  . IRRIGATION AND DEBRIDEMENT HEMATOMA Right 07/13/2018   Procedure: IRRIGATION AND DEBRIDEMENT HEMATOMA-RIGHT SHIN;  Surgeon: Herbert Pun, MD;  Location: [facility] ORS;  Service: General;  Laterality: Right;  . LUMBAR DISC SURGERY    . SPLENECTOMY, PARTIAL    . TOTAL ABDOMINAL HYSTERECTOMY      FAMILY HISTORY Family History  Problem Relation Age of Onset  . Stroke Mother     GYNECOLOGIC HISTORY:  No LMP recorded. Patient is postmenopausal.     ADVANCED DIRECTIVES:    HEALTH MAINTENANCE: Social History   Tobacco Use  . Smoking status: Never Smoker  . Smokeless tobacco: Never Used  Substance Use Topics  . Alcohol use: No    Alcohol/week: 0.0 standard drinks  . Drug use: No     Allergies  Allergen Reactions  . Aspirin     Other reaction(s): Distress (finding)  . Diazepam     Other reaction(s): Itching of Skin sneezing  . Morphine     Other reaction(s): Itching of Skin  . Other  Other reaction(s): Unknown seasonal allergies  . Tetanus Toxoids     Other reaction(s): Localized superficial swelling of skin    Current Outpatient Medications  Medication Sig Dispense Refill  . acetaminophen (TYLENOL) 325 MG tablet Take 2 tablets (650 mg total) by mouth every 6 (six) hours as needed for mild pain (or Fever >/= 101). 100 tablet 0  . albuterol (PROVENTIL  HFA;VENTOLIN HFA) 108 (90 BASE) MCG/ACT inhaler Inhale 2 puffs into the lungs every 6 (six) hours as needed for wheezing or shortness of breath.    . ALPRAZolam (XANAX) 0.5 MG tablet Take 1 tablet (0.5 mg total) by mouth 3 (three) times daily as needed for anxiety. 20 tablet 0  . buPROPion (WELLBUTRIN XL) 150 MG 24 hr tablet Take 150 mg by mouth daily.    . Calcium Carbonate-Vitamin D (CALCIUM 600+D) 600-400 MG-UNIT per tablet Take 1 tablet by mouth daily.    . citalopram (CELEXA) 40 MG tablet Take 40 mg by mouth daily.    . Cyanocobalamin (RA VITAMIN B-12 TR) 1000 MCG TBCR Take 1,000 mcg by mouth daily.     Marland Kitchen donepezil (ARICEPT) 5 MG tablet Take 10 mg by mouth at bedtime.     . fluticasone (FLONASE) 50 MCG/ACT nasal spray Place 2 sprays into both nostrils daily.     . Fluticasone-Salmeterol (ADVAIR) 250-50 MCG/DOSE AEPB Inhale 1 puff into the lungs daily as needed.     . gabapentin (NEURONTIN) 300 MG capsule Take 300 mg by mouth 4 (four) times daily.    Marland Kitchen HYDROcodone-acetaminophen (NORCO) 7.5-325 MG tablet Take 1 tablet by mouth every 4 (four) hours as needed for moderate pain or severe pain. 25 tablet 0  . levothyroxine (SYNTHROID, LEVOTHROID) 50 MCG tablet Take 50 mcg by mouth daily before breakfast. Take 30 to 60 minutes before breakfast.    . memantine (NAMENDA) 5 MG tablet Take 5 mg by mouth 2 (two) times daily.     . montelukast (SINGULAIR) 10 MG tablet Take 10 mg by mouth at bedtime.     . Multiple Vitamins-Minerals (OCUVITE-LUTEIN PO) Take 1 capsule by mouth daily.     Marland Kitchen oxybutynin (DITROPAN) 5 MG tablet Take 5 mg by mouth 3 (three) times daily.    . pantoprazole (PROTONIX) 20 MG tablet Take 20 mg by mouth 2 (two) times daily.     . pravastatin (PRAVACHOL) 20 MG tablet Take 20 mg by mouth daily.    . predniSONE (DELTASONE) 10 MG tablet Take 10 mg by mouth daily.   1  . vitamin C (ASCORBIC ACID) 500 MG tablet Take 500 mg by mouth daily.    . vitamin E (E-400) 400 UNIT capsule Take 400  Units by mouth daily.     No current facility-administered medications for this visit.     OBJECTIVE: There were no vitals taken for this visit.   There is no height or weight on file to calculate BMI.    ECOG FS:1 - Symptomatic but completely ambulatory  General: Well-developed, well-nourished, no acute distress. Eyes: Pink conjunctiva, anicteric sclera. HEENT: Normocephalic, moist mucous membranes. Lungs: Clear to auscultation bilaterally. Heart: Regular rate and rhythm. No rubs, murmurs, or gallops. Abdomen: Soft, nontender, nondistended. No organomegaly noted, normoactive bowel sounds. Musculoskeletal: No edema, cyanosis, or clubbing. Neuro: Alert, mildly confused. Cranial nerves grossly intact. Skin: No rashes or petechiae noted.  Bruising on bilateral arms. Psych: Normal affect.    LAB RESULTS:  No visits with results within 3 Day(s) from this visit.  Latest known visit with results is:  Appointment on 07/20/2018  Component Date Value Ref Range Status  . WBC 07/20/2018 16.9* 4.0 - 10.5 K/uL Final  . RBC 07/20/2018 3.11* 3.87 - 5.11 MIL/uL Final  . Hemoglobin 07/20/2018 8.0* 12.0 - 15.0 g/dL Final  . HCT 07/20/2018 26.2* 36.0 - 46.0 % Final  . MCV 07/20/2018 84.2  80.0 - 100.0 fL Final  . MCH 07/20/2018 25.7* 26.0 - 34.0 pg Final  . MCHC 07/20/2018 30.5  30.0 - 36.0 g/dL Final  . RDW 07/20/2018 20.0* 11.5 - 15.5 % Final  . Platelets 07/20/2018 40* 150 - 400 K/uL Final   Comment: REPEATED TO VERIFY SPECIMEN CHECKED FOR CLOTS Immature Platelet Fraction may be clinically indicated, consider ordering this additional test [id number]   . nRBC 07/20/2018 0.3* 0.0 - 0.2 % Final   Performed at [facility], [address] [place],  [address]    STUDIES: No results found.  ASSESSMENT:  Chronic refractory ITP  PLAN:   1. Chronic refractory ITP: Patient had a poor response to Prednisone, WinRho, and Rituxan. She is status post splenectomy in 2007.  Patient's  platelet count is decreased at 40 today, therefore we will proceed with 8 mcg/kg Nplate.  Return to clinic in 4 weeks for repeat laboratory work, further evaluation, and continuation of treatment.  Can consider Promacta 12.5mg  or Tavalisse 100 mg BID at a later date if necessary. Max dose of Nplate is 10 mcg/kg. 2. Weakness and fatigue: Chronic and unchanged.   3. Depression/dementia: Memory seems to be worse.  Patient is now at [employer]. 4.  Falls.  Likely multifactorial.  Patient expressed understanding and was in agreement with this plan. She also understands that She can call clinic at any time with any questions, concerns, or complaints.    Lloyd Huger, MD   08/21/2018 12:37 PM

## 2018-08-22 ENCOUNTER — Inpatient Hospital Stay: Payer: Medicare HMO

## 2018-08-22 ENCOUNTER — Inpatient Hospital Stay: Payer: Medicare HMO | Admitting: Oncology

## 2018-08-23 DIAGNOSIS — M1990 Unspecified osteoarthritis, unspecified site: Secondary | ICD-10-CM | POA: Diagnosis not present

## 2018-08-23 DIAGNOSIS — M549 Dorsalgia, unspecified: Secondary | ICD-10-CM | POA: Diagnosis not present

## 2018-08-23 DIAGNOSIS — J45909 Unspecified asthma, uncomplicated: Secondary | ICD-10-CM | POA: Diagnosis not present

## 2018-08-23 DIAGNOSIS — G8929 Other chronic pain: Secondary | ICD-10-CM | POA: Diagnosis not present

## 2018-08-23 DIAGNOSIS — G2581 Restless legs syndrome: Secondary | ICD-10-CM | POA: Diagnosis not present

## 2018-08-23 DIAGNOSIS — M810 Age-related osteoporosis without current pathological fracture: Secondary | ICD-10-CM | POA: Diagnosis not present

## 2018-08-23 DIAGNOSIS — F0280 Dementia in other diseases classified elsewhere without behavioral disturbance: Secondary | ICD-10-CM | POA: Diagnosis not present

## 2018-08-23 DIAGNOSIS — G309 Alzheimer's disease, unspecified: Secondary | ICD-10-CM | POA: Diagnosis not present

## 2018-08-23 DIAGNOSIS — S8011XA Contusion of right lower leg, initial encounter: Secondary | ICD-10-CM | POA: Diagnosis not present

## 2018-08-24 ENCOUNTER — Inpatient Hospital Stay: Payer: Medicare HMO | Attending: Oncology

## 2018-08-24 ENCOUNTER — Inpatient Hospital Stay: Payer: Medicare HMO | Admitting: Oncology

## 2018-08-24 ENCOUNTER — Other Ambulatory Visit: Payer: Self-pay

## 2018-08-24 ENCOUNTER — Encounter: Payer: Self-pay | Admitting: Oncology

## 2018-08-24 ENCOUNTER — Inpatient Hospital Stay: Payer: Medicare HMO

## 2018-08-24 VITALS — BP 127/65 | HR 60 | Temp 96.8°F | Resp 18 | Wt 138.5 lb

## 2018-08-24 DIAGNOSIS — R531 Weakness: Secondary | ICD-10-CM

## 2018-08-24 DIAGNOSIS — R296 Repeated falls: Secondary | ICD-10-CM

## 2018-08-24 DIAGNOSIS — D693 Immune thrombocytopenic purpura: Secondary | ICD-10-CM | POA: Diagnosis not present

## 2018-08-24 DIAGNOSIS — R5382 Chronic fatigue, unspecified: Secondary | ICD-10-CM | POA: Diagnosis not present

## 2018-08-24 DIAGNOSIS — F329 Major depressive disorder, single episode, unspecified: Secondary | ICD-10-CM | POA: Diagnosis not present

## 2018-08-24 DIAGNOSIS — F0390 Unspecified dementia without behavioral disturbance: Secondary | ICD-10-CM

## 2018-08-24 LAB — CBC
HEMATOCRIT: 29.3 % — AB (ref 36.0–46.0)
Hemoglobin: 9 g/dL — ABNORMAL LOW (ref 12.0–15.0)
MCH: 24.4 pg — ABNORMAL LOW (ref 26.0–34.0)
MCHC: 30.7 g/dL (ref 30.0–36.0)
MCV: 79.4 fL — AB (ref 80.0–100.0)
NRBC: 0 % (ref 0.0–0.2)
PLATELETS: 26 10*3/uL — AB (ref 150–400)
RBC: 3.69 MIL/uL — AB (ref 3.87–5.11)
RDW: 18.1 % — ABNORMAL HIGH (ref 11.5–15.5)
WBC: 28.3 10*3/uL — AB (ref 4.0–10.5)

## 2018-08-24 MED ORDER — ROMIPLOSTIM INJECTION 500 MCG
8.0000 ug/kg | Freq: Once | SUBCUTANEOUS | Status: AC
  Administered 2018-08-24: 500 ug via SUBCUTANEOUS
  Filled 2018-08-24: qty 1

## 2018-08-26 DIAGNOSIS — S8011XA Contusion of right lower leg, initial encounter: Secondary | ICD-10-CM | POA: Diagnosis not present

## 2018-08-26 DIAGNOSIS — M549 Dorsalgia, unspecified: Secondary | ICD-10-CM | POA: Diagnosis not present

## 2018-08-26 DIAGNOSIS — F0280 Dementia in other diseases classified elsewhere without behavioral disturbance: Secondary | ICD-10-CM | POA: Diagnosis not present

## 2018-08-26 DIAGNOSIS — M810 Age-related osteoporosis without current pathological fracture: Secondary | ICD-10-CM | POA: Diagnosis not present

## 2018-08-26 DIAGNOSIS — G2581 Restless legs syndrome: Secondary | ICD-10-CM | POA: Diagnosis not present

## 2018-08-26 DIAGNOSIS — J45909 Unspecified asthma, uncomplicated: Secondary | ICD-10-CM | POA: Diagnosis not present

## 2018-08-26 DIAGNOSIS — G8929 Other chronic pain: Secondary | ICD-10-CM | POA: Diagnosis not present

## 2018-08-26 DIAGNOSIS — M1990 Unspecified osteoarthritis, unspecified site: Secondary | ICD-10-CM | POA: Diagnosis not present

## 2018-08-26 DIAGNOSIS — G309 Alzheimer's disease, unspecified: Secondary | ICD-10-CM | POA: Diagnosis not present

## 2018-08-26 NOTE — Progress Notes (Signed)
[place]  [phone]  [phone]) [phone]     Ariana [name] DOB: 02/09/1933  MR#: [id number]  JJK#:[id number]  Patient Care Team: Idelle Crouch, MD as PCP - General (Internal Medicine)  CHIEF COMPLAINT: Chronic refractory ITP  INTERVAL HISTORY: Patient returns to clinic today for further evaluation, repeat laboratory, and continuation of Nplate.  She continues to feel well and asymptomatic.  She has worsening dementia and continues to reside in assisted living.  She does not report any increased bruising or bleeding over her baseline.  She has had no recent falls.  She has no neurologic complaints. She denies any fevers. She has no chest pain or shortness of breath. She denies any nausea, vomiting, constipation, or diarrhea.  She has no melena or hematochezia.  She has no urinary complaints.  Patient offers no specific complaints today.  REVIEW OF SYSTEMS:   Review of Systems  Constitutional: Negative.  Negative for fever, malaise/fatigue and weight loss.  HENT: Negative for congestion.   Respiratory: Negative.  Negative for cough, hemoptysis and shortness of breath.   Cardiovascular: Negative.  Negative for chest pain and leg swelling.  Gastrointestinal: Negative.  Negative for abdominal pain, blood in stool, diarrhea, melena and nausea.  Genitourinary: Negative.  Negative for dysuria and hematuria.  Musculoskeletal: Negative.  Negative for back pain and falls.  Skin: Negative.  Negative for rash.  Neurological: Negative.  Negative for sensory change, focal weakness and weakness.  Endo/Heme/Allergies: Bruises/bleeds easily.  Psychiatric/Behavioral: Positive for memory loss. Negative for depression. The patient is not nervous/anxious.     As per HPI. Otherwise, a complete review of systems is negative.  PAST MEDICAL HISTORY: Past Medical History:  Diagnosis Date  . Alzheimer disease ([place])   . Anemia   . Anxiety   . Asthma   . Chronic  back pain   . Depression   . Depression   . GERD (gastroesophageal reflux disease)   . Hiatal hernia   . Hypercholesteremia   . ITP (idiopathic thrombocytopenic purpura)   . Osteoarthritis   . Osteoporosis   . Pulmonary emboli ([place])   . Restless legs     PAST SURGICAL HISTORY: Past Surgical History:  Procedure Laterality Date  . CARPAL TUNNEL RELEASE    . ESOPHAGOGASTRODUODENOSCOPY (EGD) WITH PROPOFOL N/A 09/10/2015   Procedure: ESOPHAGOGASTRODUODENOSCOPY (EGD) WITH PROPOFOL;  Surgeon: Lollie Sails, MD;  Location: [facility] ENDOSCOPY;  Service: Endoscopy;  Laterality: N/A;  . IRRIGATION AND DEBRIDEMENT HEMATOMA Right 07/13/2018   Procedure: IRRIGATION AND DEBRIDEMENT HEMATOMA-RIGHT SHIN;  Surgeon: Herbert Pun, MD;  Location: [facility] ORS;  Service: General;  Laterality: Right;  . LUMBAR DISC SURGERY    . SPLENECTOMY, PARTIAL    . TOTAL ABDOMINAL HYSTERECTOMY      FAMILY HISTORY Family History  Problem Relation Age of Onset  . Stroke Mother     GYNECOLOGIC HISTORY:  No LMP recorded. Patient is postmenopausal.     ADVANCED DIRECTIVES:    HEALTH MAINTENANCE: Social History   Tobacco Use  . Smoking status: Never Smoker  . Smokeless tobacco: Never Used  Substance Use Topics  . Alcohol use: No    Alcohol/week: 0.0 standard drinks  . Drug use: No     Allergies  Allergen Reactions  . Aspirin     Other reaction(s): Distress (finding)  . Diazepam     Other reaction(s): Itching of Skin sneezing  . Morphine     Other reaction(s): Itching of Skin  . Other  Other reaction(s): Unknown seasonal allergies  . Tetanus Toxoids     Other reaction(s): Localized superficial swelling of skin    Current Outpatient Medications  Medication Sig Dispense Refill  . acetaminophen (TYLENOL) 325 MG tablet Take 2 tablets (650 mg total) by mouth every 6 (six) hours as needed for mild pain (or Fever >/= 101). 100 tablet 0  . albuterol (PROVENTIL HFA;VENTOLIN HFA) 108 (90  BASE) MCG/ACT inhaler Inhale 2 puffs into the lungs every 6 (six) hours as needed for wheezing or shortness of breath.    . ALPRAZolam (XANAX) 0.5 MG tablet Take 1 tablet (0.5 mg total) by mouth 3 (three) times daily as needed for anxiety. 20 tablet 0  . buPROPion (WELLBUTRIN XL) 150 MG 24 hr tablet Take 150 mg by mouth daily.    . Calcium Carbonate-Vitamin D (CALCIUM 600+D) 600-400 MG-UNIT per tablet Take 1 tablet by mouth daily.    . citalopram (CELEXA) 40 MG tablet Take 40 mg by mouth daily.    . Cyanocobalamin (RA VITAMIN B-12 TR) 1000 MCG TBCR Take 1,000 mcg by mouth daily.     Marland Kitchen donepezil (ARICEPT) 5 MG tablet Take 10 mg by mouth at bedtime.     . fluticasone (FLONASE) 50 MCG/ACT nasal spray Place 2 sprays into both nostrils daily.     . Fluticasone-Salmeterol (ADVAIR) 250-50 MCG/DOSE AEPB Inhale 1 puff into the lungs daily as needed.     . gabapentin (NEURONTIN) 300 MG capsule Take 300 mg by mouth 4 (four) times daily.    Marland Kitchen HYDROcodone-acetaminophen (NORCO) 7.5-325 MG tablet Take 1 tablet by mouth every 4 (four) hours as needed for moderate pain or severe pain. 25 tablet 0  . levothyroxine (SYNTHROID, LEVOTHROID) 50 MCG tablet Take 50 mcg by mouth daily before breakfast. Take 30 to 60 minutes before breakfast.    . montelukast (SINGULAIR) 10 MG tablet Take 10 mg by mouth at bedtime.     . Multiple Vitamins-Minerals (OCUVITE-LUTEIN PO) Take 1 capsule by mouth daily.     Marland Kitchen nystatin ointment (MYCOSTATIN) APPLY OINTMENT TOPICALLY TWICE DAILY  3  . oxybutynin (DITROPAN) 5 MG tablet Take 5 mg by mouth 3 (three) times daily.    . pantoprazole (PROTONIX) 20 MG tablet Take 20 mg by mouth 2 (two) times daily.     . pravastatin (PRAVACHOL) 20 MG tablet Take 20 mg by mouth daily.    . predniSONE (DELTASONE) 10 MG tablet Take 10 mg by mouth daily.   1  . SSD 1 % cream Apply topically 2 (two) times daily.  1  . traMADol (ULTRAM) 50 MG tablet Take 50 mg by mouth 2 (two) times daily as needed.  5  .  vitamin C (ASCORBIC ACID) 500 MG tablet Take 500 mg by mouth daily.    . vitamin E (E-400) 400 UNIT capsule Take 400 Units by mouth daily.    . memantine (NAMENDA) 5 MG tablet Take 5 mg by mouth 2 (two) times daily.      No current facility-administered medications for this visit.     OBJECTIVE: BP 127/65 (BP Location: Left Arm, Patient Position: Sitting)   Pulse 60   Temp (!) 96.8 F (36 C) (Tympanic)   Resp 18   Wt 138 lb 8 oz (62.8 kg)   BMI 22.70 kg/m    Body mass index is 22.7 kg/m.    ECOG FS:1 - Symptomatic but completely ambulatory  General: Well-developed, well-nourished, no acute distress. Eyes: Pink conjunctiva, anicteric sclera. HEENT:  Normocephalic, moist mucous membranes, clear oropharnyx. Lungs: Clear to auscultation bilaterally. Heart: Regular rate and rhythm. No rubs, murmurs, or gallops. Abdomen: Soft, nontender, nondistended. No organomegaly noted, normoactive bowel sounds. Musculoskeletal: No edema, cyanosis, or clubbing. Neuro: Mildly confused. Cranial nerves grossly intact. Skin: No rashes or petechiae noted.  Ecchymosis on bilateral arm. Psych: Normal affect.  LAB RESULTS:  Appointment on 08/24/2018  Component Date Value Ref Range Status  . WBC 08/24/2018 28.3* 4.0 - 10.5 K/uL Final  . RBC 08/24/2018 3.69* 3.87 - 5.11 MIL/uL Final  . Hemoglobin 08/24/2018 9.0* 12.0 - 15.0 g/dL Final  . HCT 08/24/2018 29.3* 36.0 - 46.0 % Final  . MCV 08/24/2018 79.4* 80.0 - 100.0 fL Final  . MCH 08/24/2018 24.4* 26.0 - 34.0 pg Final  . MCHC 08/24/2018 30.7  30.0 - 36.0 g/dL Final  . RDW 08/24/2018 18.1* 11.5 - 15.5 % Final  . Platelets 08/24/2018 26* 150 - 400 K/uL Final   Comment: REPEATED TO VERIFY PLATELET COUNT CONFIRMED BY SMEAR PLATELETS APPEAR DECREASED SPECIMEN CHECKED FOR CLOTS CRITICAL RESULT CALLED TO, READ BACK BY AND VERIFIED WITH: [facility] [place] AT 11:09 08/24/2018 LGR   . nRBC 08/24/2018 0.0  0.0 - 0.2 % Final   Performed at [facility], [address] [place], [place] [address]    STUDIES: No results found.  ASSESSMENT:  Chronic refractory ITP  PLAN:   1. Chronic refractory ITP: Patient had a poor response to Prednisone, WinRho, and Rituxan. She is status post splenectomy in 2007.  Patient's platelet count has decreased to 26 today, therefore we will proceed with 8 mcg/kg Nplate.  Return to clinic in 1 month with repeat laboratory work, further evaluation, and continuation of treatment.  Maximum dose of Nplate is 10 mcg/kg.  If patient becomes refractory to treatment, can consider Promacta 12.5mg  or Tavalisse 100 mg BID at a later date if necessary. Max dose of Nplate is 10 mcg/kg. 2. Weakness and fatigue: Chronic and unchanged.   3. Depression/dementia: Chronic and unchanged.  Patient is now at [employer]. 4.  Falls:  Patient does not report any falls over the past month.  Patient expressed understanding and was in agreement with this plan. She also understands that She can call clinic at any time with any questions, concerns, or complaints.    Lloyd Huger, MD   08/26/2018 9:04 AM

## 2018-08-27 DIAGNOSIS — M1990 Unspecified osteoarthritis, unspecified site: Secondary | ICD-10-CM | POA: Diagnosis not present

## 2018-08-27 DIAGNOSIS — S8011XA Contusion of right lower leg, initial encounter: Secondary | ICD-10-CM | POA: Diagnosis not present

## 2018-08-27 DIAGNOSIS — F0280 Dementia in other diseases classified elsewhere without behavioral disturbance: Secondary | ICD-10-CM | POA: Diagnosis not present

## 2018-08-27 DIAGNOSIS — J45909 Unspecified asthma, uncomplicated: Secondary | ICD-10-CM | POA: Diagnosis not present

## 2018-08-27 DIAGNOSIS — G8929 Other chronic pain: Secondary | ICD-10-CM | POA: Diagnosis not present

## 2018-08-27 DIAGNOSIS — M549 Dorsalgia, unspecified: Secondary | ICD-10-CM | POA: Diagnosis not present

## 2018-08-27 DIAGNOSIS — M810 Age-related osteoporosis without current pathological fracture: Secondary | ICD-10-CM | POA: Diagnosis not present

## 2018-08-27 DIAGNOSIS — G309 Alzheimer's disease, unspecified: Secondary | ICD-10-CM | POA: Diagnosis not present

## 2018-08-27 DIAGNOSIS — G2581 Restless legs syndrome: Secondary | ICD-10-CM | POA: Diagnosis not present

## 2018-08-30 DIAGNOSIS — G309 Alzheimer's disease, unspecified: Secondary | ICD-10-CM | POA: Diagnosis not present

## 2018-08-30 DIAGNOSIS — G2581 Restless legs syndrome: Secondary | ICD-10-CM | POA: Diagnosis not present

## 2018-08-30 DIAGNOSIS — G8929 Other chronic pain: Secondary | ICD-10-CM | POA: Diagnosis not present

## 2018-08-30 DIAGNOSIS — M549 Dorsalgia, unspecified: Secondary | ICD-10-CM | POA: Diagnosis not present

## 2018-08-30 DIAGNOSIS — M810 Age-related osteoporosis without current pathological fracture: Secondary | ICD-10-CM | POA: Diagnosis not present

## 2018-08-30 DIAGNOSIS — F0280 Dementia in other diseases classified elsewhere without behavioral disturbance: Secondary | ICD-10-CM | POA: Diagnosis not present

## 2018-08-30 DIAGNOSIS — J45909 Unspecified asthma, uncomplicated: Secondary | ICD-10-CM | POA: Diagnosis not present

## 2018-08-30 DIAGNOSIS — S8011XA Contusion of right lower leg, initial encounter: Secondary | ICD-10-CM | POA: Diagnosis not present

## 2018-08-30 DIAGNOSIS — M1990 Unspecified osteoarthritis, unspecified site: Secondary | ICD-10-CM | POA: Diagnosis not present

## 2018-08-31 DIAGNOSIS — S8011XA Contusion of right lower leg, initial encounter: Secondary | ICD-10-CM | POA: Diagnosis not present

## 2018-08-31 DIAGNOSIS — M810 Age-related osteoporosis without current pathological fracture: Secondary | ICD-10-CM | POA: Diagnosis not present

## 2018-08-31 DIAGNOSIS — M1990 Unspecified osteoarthritis, unspecified site: Secondary | ICD-10-CM | POA: Diagnosis not present

## 2018-08-31 DIAGNOSIS — G8929 Other chronic pain: Secondary | ICD-10-CM | POA: Diagnosis not present

## 2018-08-31 DIAGNOSIS — M549 Dorsalgia, unspecified: Secondary | ICD-10-CM | POA: Diagnosis not present

## 2018-08-31 DIAGNOSIS — F0280 Dementia in other diseases classified elsewhere without behavioral disturbance: Secondary | ICD-10-CM | POA: Diagnosis not present

## 2018-08-31 DIAGNOSIS — J45909 Unspecified asthma, uncomplicated: Secondary | ICD-10-CM | POA: Diagnosis not present

## 2018-08-31 DIAGNOSIS — G2581 Restless legs syndrome: Secondary | ICD-10-CM | POA: Diagnosis not present

## 2018-08-31 DIAGNOSIS — G309 Alzheimer's disease, unspecified: Secondary | ICD-10-CM | POA: Diagnosis not present

## 2018-09-02 DIAGNOSIS — M810 Age-related osteoporosis without current pathological fracture: Secondary | ICD-10-CM | POA: Diagnosis not present

## 2018-09-02 DIAGNOSIS — G309 Alzheimer's disease, unspecified: Secondary | ICD-10-CM | POA: Diagnosis not present

## 2018-09-02 DIAGNOSIS — J45909 Unspecified asthma, uncomplicated: Secondary | ICD-10-CM | POA: Diagnosis not present

## 2018-09-02 DIAGNOSIS — S8011XA Contusion of right lower leg, initial encounter: Secondary | ICD-10-CM | POA: Diagnosis not present

## 2018-09-02 DIAGNOSIS — F0280 Dementia in other diseases classified elsewhere without behavioral disturbance: Secondary | ICD-10-CM | POA: Diagnosis not present

## 2018-09-02 DIAGNOSIS — M1990 Unspecified osteoarthritis, unspecified site: Secondary | ICD-10-CM | POA: Diagnosis not present

## 2018-09-02 DIAGNOSIS — M549 Dorsalgia, unspecified: Secondary | ICD-10-CM | POA: Diagnosis not present

## 2018-09-02 DIAGNOSIS — G8929 Other chronic pain: Secondary | ICD-10-CM | POA: Diagnosis not present

## 2018-09-02 DIAGNOSIS — G2581 Restless legs syndrome: Secondary | ICD-10-CM | POA: Diagnosis not present

## 2018-09-12 DIAGNOSIS — M1990 Unspecified osteoarthritis, unspecified site: Secondary | ICD-10-CM | POA: Diagnosis not present

## 2018-09-12 DIAGNOSIS — M810 Age-related osteoporosis without current pathological fracture: Secondary | ICD-10-CM | POA: Diagnosis not present

## 2018-09-12 DIAGNOSIS — S8011XA Contusion of right lower leg, initial encounter: Secondary | ICD-10-CM | POA: Diagnosis not present

## 2018-09-12 DIAGNOSIS — G309 Alzheimer's disease, unspecified: Secondary | ICD-10-CM | POA: Diagnosis not present

## 2018-09-12 DIAGNOSIS — J45909 Unspecified asthma, uncomplicated: Secondary | ICD-10-CM | POA: Diagnosis not present

## 2018-09-12 DIAGNOSIS — M549 Dorsalgia, unspecified: Secondary | ICD-10-CM | POA: Diagnosis not present

## 2018-09-12 DIAGNOSIS — F0280 Dementia in other diseases classified elsewhere without behavioral disturbance: Secondary | ICD-10-CM | POA: Diagnosis not present

## 2018-09-12 DIAGNOSIS — G8929 Other chronic pain: Secondary | ICD-10-CM | POA: Diagnosis not present

## 2018-09-12 DIAGNOSIS — G2581 Restless legs syndrome: Secondary | ICD-10-CM | POA: Diagnosis not present

## 2018-09-18 NOTE — Progress Notes (Signed)
[place]  Telephone:(336) [phone]  Fax:(336) [phone]     Ariana [name] DOB: 02/22/1933  MR#: [id number]  QIO#:[id number]  Patient Care Team: Idelle Crouch, MD as PCP - General (Internal Medicine)  CHIEF COMPLAINT: Chronic refractory ITP  INTERVAL HISTORY: Patient returns to clinic today for further evaluation and continuation of Nplate.  She continues to feel well and remains asymptomatic.  She continues to have worsening problems with memory and resides in assisted living now.  She does not report any increased bruising or bleeding over her baseline.  She has had no recent falls.  She has no neurologic complaints. She denies any fevers. She has no chest pain or shortness of breath. She denies any nausea, vomiting, constipation, or diarrhea.  She has no melena or hematochezia.  She has no urinary complaints.  Patient offers no further specific complaints today.  REVIEW OF SYSTEMS:   Review of Systems  Constitutional: Negative.  Negative for fever, malaise/fatigue and weight loss.  HENT: Negative for congestion.   Respiratory: Negative.  Negative for cough, hemoptysis and shortness of breath.   Cardiovascular: Negative.  Negative for chest pain and leg swelling.  Gastrointestinal: Negative.  Negative for abdominal pain, blood in stool, diarrhea, melena and nausea.  Genitourinary: Negative.  Negative for dysuria and hematuria.  Musculoskeletal: Negative.  Negative for back pain and falls.  Skin: Negative.  Negative for rash.  Neurological: Negative.  Negative for sensory change, focal weakness and weakness.  Endo/Heme/Allergies: Bruises/bleeds easily.  Psychiatric/Behavioral: Positive for memory loss. Negative for depression. The patient is not nervous/anxious.     As per HPI. Otherwise, a complete review of systems is negative.  PAST MEDICAL HISTORY: Past Medical History:  Diagnosis Date  . Alzheimer disease ([place])   . Anemia   . Anxiety   .  Asthma   . Chronic back pain   . Depression   . Depression   . GERD (gastroesophageal reflux disease)   . Hiatal hernia   . Hypercholesteremia   . ITP (idiopathic thrombocytopenic purpura)   . Osteoarthritis   . Osteoporosis   . Pulmonary emboli ([place])   . Restless legs     PAST SURGICAL HISTORY: Past Surgical History:  Procedure Laterality Date  . CARPAL TUNNEL RELEASE    . ESOPHAGOGASTRODUODENOSCOPY (EGD) WITH PROPOFOL N/A 09/10/2015   Procedure: ESOPHAGOGASTRODUODENOSCOPY (EGD) WITH PROPOFOL;  Surgeon: Lollie Sails, MD;  Location: [facility] ENDOSCOPY;  Service: Endoscopy;  Laterality: N/A;  . IRRIGATION AND DEBRIDEMENT HEMATOMA Right 07/13/2018   Procedure: IRRIGATION AND DEBRIDEMENT HEMATOMA-RIGHT SHIN;  Surgeon: Herbert Pun, MD;  Location: [facility] ORS;  Service: General;  Laterality: Right;  . LUMBAR DISC SURGERY    . SPLENECTOMY, PARTIAL    . TOTAL ABDOMINAL HYSTERECTOMY      FAMILY HISTORY Family History  Problem Relation Age of Onset  . Stroke Mother     GYNECOLOGIC HISTORY:  No LMP recorded. Patient is postmenopausal.     ADVANCED DIRECTIVES:    HEALTH MAINTENANCE: Social History   Tobacco Use  . Smoking status: Never Smoker  . Smokeless tobacco: Never Used  Substance Use Topics  . Alcohol use: No    Alcohol/week: 0.0 standard drinks  . Drug use: No     Allergies  Allergen Reactions  . Aspirin     Other reaction(s): Distress (finding)  . Diazepam     Other reaction(s): Itching of Skin sneezing  . Morphine     Other reaction(s): Itching of Skin  .  Other     Other reaction(s): Unknown seasonal allergies  . Tetanus Toxoids     Other reaction(s): Localized superficial swelling of skin    Current Outpatient Medications  Medication Sig Dispense Refill  . acetaminophen (TYLENOL) 325 MG tablet Take 2 tablets (650 mg total) by mouth every 6 (six) hours as needed for mild pain (or Fever >/= 101). 100 tablet 0  . albuterol (PROVENTIL  HFA;VENTOLIN HFA) 108 (90 BASE) MCG/ACT inhaler Inhale 2 puffs into the lungs every 6 (six) hours as needed for wheezing or shortness of breath.    . ALPRAZolam (XANAX) 0.5 MG tablet Take 1 tablet (0.5 mg total) by mouth 3 (three) times daily as needed for anxiety. 20 tablet 0  . buPROPion (WELLBUTRIN XL) 150 MG 24 hr tablet Take 150 mg by mouth daily.    . Calcium Carbonate-Vitamin D (CALCIUM 600+D) 600-400 MG-UNIT per tablet Take 1 tablet by mouth daily.    . citalopram (CELEXA) 40 MG tablet Take 40 mg by mouth daily.    . Cyanocobalamin (RA VITAMIN B-12 TR) 1000 MCG TBCR Take 1,000 mcg by mouth daily.     Marland Kitchen donepezil (ARICEPT) 5 MG tablet Take 10 mg by mouth at bedtime.     . fluticasone (FLONASE) 50 MCG/ACT nasal spray Place 2 sprays into both nostrils daily.     . Fluticasone-Salmeterol (ADVAIR) 250-50 MCG/DOSE AEPB Inhale 1 puff into the lungs daily as needed.     . gabapentin (NEURONTIN) 300 MG capsule Take 300 mg by mouth 4 (four) times daily.    Marland Kitchen HYDROcodone-acetaminophen (NORCO) 7.5-325 MG tablet Take 1 tablet by mouth every 4 (four) hours as needed for moderate pain or severe pain. 25 tablet 0  . levothyroxine (SYNTHROID, LEVOTHROID) 50 MCG tablet Take 50 mcg by mouth daily before breakfast. Take 30 to 60 minutes before breakfast.    . montelukast (SINGULAIR) 10 MG tablet Take 10 mg by mouth at bedtime.     . Multiple Vitamins-Minerals (OCUVITE-LUTEIN PO) Take 1 capsule by mouth daily.     Marland Kitchen nystatin ointment (MYCOSTATIN) APPLY OINTMENT TOPICALLY TWICE DAILY  3  . oxybutynin (DITROPAN) 5 MG tablet Take 5 mg by mouth 3 (three) times daily.    . pantoprazole (PROTONIX) 20 MG tablet Take 20 mg by mouth 2 (two) times daily.     . pravastatin (PRAVACHOL) 20 MG tablet Take 20 mg by mouth daily.    . predniSONE (DELTASONE) 10 MG tablet Take 10 mg by mouth daily.   1  . SSD 1 % cream Apply topically 2 (two) times daily.  1  . traMADol (ULTRAM) 50 MG tablet Take 50 mg by mouth 2 (two) times  daily as needed.  5  . vitamin C (ASCORBIC ACID) 500 MG tablet Take 500 mg by mouth daily.    . vitamin E (E-400) 400 UNIT capsule Take 400 Units by mouth daily.    . memantine (NAMENDA) 5 MG tablet Take 5 mg by mouth 2 (two) times daily.     Marland Kitchen triamcinolone cream (KENALOG) 0.1 % Apply topically.     No current facility-administered medications for this visit.     OBJECTIVE: BP 134/82   Pulse (!) 59   Resp 18   Wt 141 lb 1.6 oz (64 kg)   BMI 23.12 kg/m    Body mass index is 23.12 kg/m.    ECOG FS:1 - Symptomatic but completely ambulatory  General: Well-developed, well-nourished, no acute distress. Eyes: Pink conjunctiva, anicteric sclera.  HEENT: Normocephalic, moist mucous membranes. Lungs: Clear to auscultation bilaterally. Heart: Regular rate and rhythm. No rubs, murmurs, or gallops. Abdomen: Soft, nontender, nondistended. No organomegaly noted, normoactive bowel sounds. Musculoskeletal: No edema, cyanosis, or clubbing. Neuro: Alert, answering all questions appropriately. Cranial nerves grossly intact. Skin: No rashes or petechiae noted.  Ecchymosis on bilateral arms. Psych: Normal affect.  LAB RESULTS:  Appointment on 09/21/2018  Component Date Value Ref Range Status  . WBC 09/21/2018 12.8* 4.0 - 10.5 K/uL Final  . RBC 09/21/2018 3.67* 3.87 - 5.11 MIL/uL Final  . Hemoglobin 09/21/2018 8.6* 12.0 - 15.0 g/dL Final   Comment: Reticulocyte Hemoglobin testing may be clinically indicated, consider ordering this additional test [id number]   . HCT 09/21/2018 28.6* 36.0 - 46.0 % Final  . MCV 09/21/2018 77.9* 80.0 - 100.0 fL Final  . MCH 09/21/2018 23.4* 26.0 - 34.0 pg Final  . MCHC 09/21/2018 30.1  30.0 - 36.0 g/dL Final  . RDW 09/21/2018 18.8* 11.5 - 15.5 % Final  . Platelets 09/21/2018 49* 150 - 400 K/uL Final   Comment: REPEATED TO VERIFY SPECIMEN CHECKED FOR CLOTS   . nRBC 09/21/2018 0.4* 0.0 - 0.2 % Final   Performed at [facility], [address]  [place], [place] [address]    STUDIES: No results found.  ASSESSMENT:  Chronic refractory ITP  PLAN:   1. Chronic refractory ITP: Patient had a poor response to Prednisone, WinRho, and Rituxan. She is status post splenectomy in 2007.  Patient's platelet count is decreased to 49 today which is improved from her baseline.  Proceed with 8 mcg/kg Nplate.  Return to clinic in 4 weeks for further evaluation and continuation of treatment if her platelet count is lower than 100.  Maximum dose of Nplate is 10 mcg/kg.  If patient becomes refractory to treatment, can consider Promacta 12.5mg  or Tavalisse 100 mg BID at a later date if necessary. Max dose of Nplate is 10 mcg/kg. 2. Weakness and fatigue: Chronic and unchanged.   3. Depression/dementia: Chronic and unchanged.  Patient is now at [employer]. 4.  Falls:  Patient does not report any falls over the past month.  I spent a total of 20 minutes face-to-face with the patient of which greater than 50% of the visit was spent in counseling and coordination of care as detailed above.   Patient expressed understanding and was in agreement with this plan. She also understands that She can call clinic at any time with any questions, concerns, or complaints.    Lloyd Huger, MD   09/23/2018 12:21 PM

## 2018-09-21 ENCOUNTER — Other Ambulatory Visit: Payer: Self-pay

## 2018-09-21 ENCOUNTER — Encounter: Payer: Self-pay | Admitting: Oncology

## 2018-09-21 ENCOUNTER — Inpatient Hospital Stay: Payer: Medicare HMO

## 2018-09-21 ENCOUNTER — Inpatient Hospital Stay: Payer: Medicare HMO | Attending: Oncology

## 2018-09-21 ENCOUNTER — Inpatient Hospital Stay: Payer: Medicare HMO | Admitting: Oncology

## 2018-09-21 VITALS — BP 134/82 | HR 59 | Resp 18 | Wt 141.1 lb

## 2018-09-21 DIAGNOSIS — M810 Age-related osteoporosis without current pathological fracture: Secondary | ICD-10-CM | POA: Diagnosis not present

## 2018-09-21 DIAGNOSIS — D693 Immune thrombocytopenic purpura: Secondary | ICD-10-CM

## 2018-09-21 DIAGNOSIS — R531 Weakness: Secondary | ICD-10-CM

## 2018-09-21 DIAGNOSIS — F0390 Unspecified dementia without behavioral disturbance: Secondary | ICD-10-CM

## 2018-09-21 DIAGNOSIS — M1990 Unspecified osteoarthritis, unspecified site: Secondary | ICD-10-CM | POA: Diagnosis not present

## 2018-09-21 DIAGNOSIS — G309 Alzheimer's disease, unspecified: Secondary | ICD-10-CM | POA: Diagnosis not present

## 2018-09-21 DIAGNOSIS — J45909 Unspecified asthma, uncomplicated: Secondary | ICD-10-CM | POA: Diagnosis not present

## 2018-09-21 DIAGNOSIS — F329 Major depressive disorder, single episode, unspecified: Secondary | ICD-10-CM | POA: Diagnosis not present

## 2018-09-21 DIAGNOSIS — S8011XA Contusion of right lower leg, initial encounter: Secondary | ICD-10-CM | POA: Diagnosis not present

## 2018-09-21 DIAGNOSIS — G2581 Restless legs syndrome: Secondary | ICD-10-CM | POA: Diagnosis not present

## 2018-09-21 DIAGNOSIS — G8929 Other chronic pain: Secondary | ICD-10-CM | POA: Diagnosis not present

## 2018-09-21 DIAGNOSIS — R296 Repeated falls: Secondary | ICD-10-CM | POA: Diagnosis not present

## 2018-09-21 DIAGNOSIS — R5383 Other fatigue: Secondary | ICD-10-CM | POA: Diagnosis not present

## 2018-09-21 DIAGNOSIS — M549 Dorsalgia, unspecified: Secondary | ICD-10-CM | POA: Diagnosis not present

## 2018-09-21 DIAGNOSIS — F0280 Dementia in other diseases classified elsewhere without behavioral disturbance: Secondary | ICD-10-CM | POA: Diagnosis not present

## 2018-09-21 LAB — CBC
HCT: 28.6 % — ABNORMAL LOW (ref 36.0–46.0)
Hemoglobin: 8.6 g/dL — ABNORMAL LOW (ref 12.0–15.0)
MCH: 23.4 pg — AB (ref 26.0–34.0)
MCHC: 30.1 g/dL (ref 30.0–36.0)
MCV: 77.9 fL — ABNORMAL LOW (ref 80.0–100.0)
NRBC: 0.4 % — AB (ref 0.0–0.2)
Platelets: 49 10*3/uL — ABNORMAL LOW (ref 150–400)
RBC: 3.67 MIL/uL — AB (ref 3.87–5.11)
RDW: 18.8 % — ABNORMAL HIGH (ref 11.5–15.5)
WBC: 12.8 10*3/uL — ABNORMAL HIGH (ref 4.0–10.5)

## 2018-09-21 MED ORDER — ROMIPLOSTIM INJECTION 500 MCG
500.0000 ug | Freq: Once | SUBCUTANEOUS | Status: AC
  Administered 2018-09-21: 500 ug via SUBCUTANEOUS
  Filled 2018-09-21: qty 1

## 2018-09-21 NOTE — Progress Notes (Signed)
Patient here today for follow up regarding thrombocytopenia. Patient has area to right arm that she is concerned.

## 2018-09-22 ENCOUNTER — Ambulatory Visit: Payer: Medicare HMO | Admitting: Podiatry

## 2018-09-22 ENCOUNTER — Encounter: Payer: Self-pay | Admitting: Podiatry

## 2018-09-22 DIAGNOSIS — B351 Tinea unguium: Secondary | ICD-10-CM | POA: Diagnosis not present

## 2018-09-22 DIAGNOSIS — M79675 Pain in left toe(s): Secondary | ICD-10-CM | POA: Diagnosis not present

## 2018-09-22 DIAGNOSIS — M79674 Pain in right toe(s): Secondary | ICD-10-CM

## 2018-09-22 NOTE — Progress Notes (Addendum)
Complaint:  Visit Type: Patient returns to my office for continued preventative foot care services. Complaint: Patient states" my nails have grown long and thick and become painful to walk and wear shoes"  The patient presents for preventative foot care services. No changes to ROS  Podiatric Exam: Vascular: dorsalis pedis and posterior tibial pulses are palpable bilateral. Capillary return is immediate. Temperature gradient is WNL. Skin turgor WNL  Sensorium: Normal Semmes Weinstein monofilament test. Normal tactile sensation bilaterally. Nail Exam: Pt has thick disfigured discolored nails with subungual debris noted right toes 1-5. Ulcer Exam: There is no evidence of ulcer or pre-ulcerative changes or infection. Orthopedic Exam: Muscle tone and strength are WNL. No limitations in general ROM. No crepitus or effusions noted. Foot type and digits show no abnormalities. Bony prominences are unremarkable. Skin: No Porokeratosis. No infection or ulcers  Diagnosis:  Onychomycosis, , Pain in right toe, pain in left toes  Treatment & Plan Procedures and Treatment: Consent by patient was obtained for treatment procedures.   Debridement of mycotic and hypertrophic toenails, 1 through 5 bilateral and clearing of subungual debris. No ulceration, no infection noted.  Return Visit-Office Procedure: Patient instructed to return to the office for a follow up visit 3 months for continued evaluation and treatment.    Gardiner Barefoot DPM

## 2018-09-26 DIAGNOSIS — M1990 Unspecified osteoarthritis, unspecified site: Secondary | ICD-10-CM | POA: Diagnosis not present

## 2018-09-26 DIAGNOSIS — M549 Dorsalgia, unspecified: Secondary | ICD-10-CM | POA: Diagnosis not present

## 2018-09-26 DIAGNOSIS — T8189XD Other complications of procedures, not elsewhere classified, subsequent encounter: Secondary | ICD-10-CM | POA: Diagnosis not present

## 2018-09-26 DIAGNOSIS — S8011XD Contusion of right lower leg, subsequent encounter: Secondary | ICD-10-CM | POA: Diagnosis not present

## 2018-09-26 DIAGNOSIS — F0280 Dementia in other diseases classified elsewhere without behavioral disturbance: Secondary | ICD-10-CM | POA: Diagnosis not present

## 2018-09-26 DIAGNOSIS — M810 Age-related osteoporosis without current pathological fracture: Secondary | ICD-10-CM | POA: Diagnosis not present

## 2018-09-26 DIAGNOSIS — G309 Alzheimer's disease, unspecified: Secondary | ICD-10-CM | POA: Diagnosis not present

## 2018-09-26 DIAGNOSIS — G8929 Other chronic pain: Secondary | ICD-10-CM | POA: Diagnosis not present

## 2018-09-26 DIAGNOSIS — J45909 Unspecified asthma, uncomplicated: Secondary | ICD-10-CM | POA: Diagnosis not present

## 2018-09-28 DIAGNOSIS — I10 Essential (primary) hypertension: Secondary | ICD-10-CM | POA: Diagnosis not present

## 2018-09-28 DIAGNOSIS — F419 Anxiety disorder, unspecified: Secondary | ICD-10-CM | POA: Diagnosis not present

## 2018-09-28 DIAGNOSIS — F329 Major depressive disorder, single episode, unspecified: Secondary | ICD-10-CM | POA: Diagnosis not present

## 2018-09-28 DIAGNOSIS — Z23 Encounter for immunization: Secondary | ICD-10-CM | POA: Diagnosis not present

## 2018-09-28 DIAGNOSIS — D473 Essential (hemorrhagic) thrombocythemia: Secondary | ICD-10-CM | POA: Diagnosis not present

## 2018-09-28 DIAGNOSIS — J4520 Mild intermittent asthma, uncomplicated: Secondary | ICD-10-CM | POA: Diagnosis not present

## 2018-09-28 DIAGNOSIS — F0280 Dementia in other diseases classified elsewhere without behavioral disturbance: Secondary | ICD-10-CM | POA: Diagnosis not present

## 2018-09-28 DIAGNOSIS — G4733 Obstructive sleep apnea (adult) (pediatric): Secondary | ICD-10-CM | POA: Insufficient documentation

## 2018-09-28 DIAGNOSIS — R2689 Other abnormalities of gait and mobility: Secondary | ICD-10-CM | POA: Diagnosis not present

## 2018-09-28 DIAGNOSIS — G301 Alzheimer's disease with late onset: Secondary | ICD-10-CM | POA: Diagnosis not present

## 2018-09-28 DIAGNOSIS — G6289 Other specified polyneuropathies: Secondary | ICD-10-CM | POA: Diagnosis not present

## 2018-09-28 DIAGNOSIS — Z79899 Other long term (current) drug therapy: Secondary | ICD-10-CM | POA: Diagnosis not present

## 2018-09-28 DIAGNOSIS — E039 Hypothyroidism, unspecified: Secondary | ICD-10-CM | POA: Diagnosis not present

## 2018-10-03 DIAGNOSIS — F0280 Dementia in other diseases classified elsewhere without behavioral disturbance: Secondary | ICD-10-CM | POA: Diagnosis not present

## 2018-10-03 DIAGNOSIS — G8929 Other chronic pain: Secondary | ICD-10-CM | POA: Diagnosis not present

## 2018-10-03 DIAGNOSIS — S8011XD Contusion of right lower leg, subsequent encounter: Secondary | ICD-10-CM | POA: Diagnosis not present

## 2018-10-03 DIAGNOSIS — T8189XD Other complications of procedures, not elsewhere classified, subsequent encounter: Secondary | ICD-10-CM | POA: Diagnosis not present

## 2018-10-03 DIAGNOSIS — J45909 Unspecified asthma, uncomplicated: Secondary | ICD-10-CM | POA: Diagnosis not present

## 2018-10-03 DIAGNOSIS — M1990 Unspecified osteoarthritis, unspecified site: Secondary | ICD-10-CM | POA: Diagnosis not present

## 2018-10-03 DIAGNOSIS — M810 Age-related osteoporosis without current pathological fracture: Secondary | ICD-10-CM | POA: Diagnosis not present

## 2018-10-03 DIAGNOSIS — G309 Alzheimer's disease, unspecified: Secondary | ICD-10-CM | POA: Diagnosis not present

## 2018-10-03 DIAGNOSIS — M549 Dorsalgia, unspecified: Secondary | ICD-10-CM | POA: Diagnosis not present

## 2018-10-04 DIAGNOSIS — S8011XD Contusion of right lower leg, subsequent encounter: Secondary | ICD-10-CM | POA: Diagnosis not present

## 2018-10-04 DIAGNOSIS — G309 Alzheimer's disease, unspecified: Secondary | ICD-10-CM | POA: Diagnosis not present

## 2018-10-04 DIAGNOSIS — F0280 Dementia in other diseases classified elsewhere without behavioral disturbance: Secondary | ICD-10-CM | POA: Diagnosis not present

## 2018-10-04 DIAGNOSIS — M549 Dorsalgia, unspecified: Secondary | ICD-10-CM | POA: Diagnosis not present

## 2018-10-04 DIAGNOSIS — M810 Age-related osteoporosis without current pathological fracture: Secondary | ICD-10-CM | POA: Diagnosis not present

## 2018-10-04 DIAGNOSIS — M1990 Unspecified osteoarthritis, unspecified site: Secondary | ICD-10-CM | POA: Diagnosis not present

## 2018-10-04 DIAGNOSIS — T8189XD Other complications of procedures, not elsewhere classified, subsequent encounter: Secondary | ICD-10-CM | POA: Diagnosis not present

## 2018-10-04 DIAGNOSIS — G8929 Other chronic pain: Secondary | ICD-10-CM | POA: Diagnosis not present

## 2018-10-04 DIAGNOSIS — J45909 Unspecified asthma, uncomplicated: Secondary | ICD-10-CM | POA: Diagnosis not present

## 2018-10-10 DIAGNOSIS — M810 Age-related osteoporosis without current pathological fracture: Secondary | ICD-10-CM | POA: Diagnosis not present

## 2018-10-10 DIAGNOSIS — J45909 Unspecified asthma, uncomplicated: Secondary | ICD-10-CM | POA: Diagnosis not present

## 2018-10-10 DIAGNOSIS — T8189XD Other complications of procedures, not elsewhere classified, subsequent encounter: Secondary | ICD-10-CM | POA: Diagnosis not present

## 2018-10-10 DIAGNOSIS — M1990 Unspecified osteoarthritis, unspecified site: Secondary | ICD-10-CM | POA: Diagnosis not present

## 2018-10-10 DIAGNOSIS — S8011XD Contusion of right lower leg, subsequent encounter: Secondary | ICD-10-CM | POA: Diagnosis not present

## 2018-10-10 DIAGNOSIS — F0280 Dementia in other diseases classified elsewhere without behavioral disturbance: Secondary | ICD-10-CM | POA: Diagnosis not present

## 2018-10-10 DIAGNOSIS — M549 Dorsalgia, unspecified: Secondary | ICD-10-CM | POA: Diagnosis not present

## 2018-10-10 DIAGNOSIS — G8929 Other chronic pain: Secondary | ICD-10-CM | POA: Diagnosis not present

## 2018-10-10 DIAGNOSIS — G309 Alzheimer's disease, unspecified: Secondary | ICD-10-CM | POA: Diagnosis not present

## 2018-10-14 ENCOUNTER — Other Ambulatory Visit: Payer: Self-pay | Admitting: *Deleted

## 2018-10-14 DIAGNOSIS — D693 Immune thrombocytopenic purpura: Secondary | ICD-10-CM

## 2018-10-14 NOTE — Progress Notes (Signed)
[place]  [phone]) [phone]  [phone]) [phone]     Ariana [name] DOB: June 02, 1933  MR#: [id number]  DPO#:[id number]  Patient Care Team: Idelle Crouch, MD as PCP - General (Internal Medicine)  CHIEF COMPLAINT: Chronic refractory ITP  INTERVAL HISTORY: Patient returns to clinic today for further evaluation and continuation of Nplate.  She currently feels well and is asymptomatic.  She continues to have issues with memory. She does not report any increased bruising or bleeding over her baseline.  She has had no recent falls.  She has no neurologic complaints. She denies any fevers. She has no chest pain or shortness of breath. She denies any nausea, vomiting, constipation, or diarrhea.  She has no melena or hematochezia.  She has no urinary complaints.  Patient feels at her baseline offers no specific complaints today.  REVIEW OF SYSTEMS:   Review of Systems  Constitutional: Negative.  Negative for fever, malaise/fatigue and weight loss.  HENT: Negative for congestion.   Respiratory: Negative.  Negative for cough, hemoptysis and shortness of breath.   Cardiovascular: Negative.  Negative for chest pain and leg swelling.  Gastrointestinal: Negative.  Negative for abdominal pain, blood in stool, diarrhea, melena and nausea.  Genitourinary: Negative.  Negative for dysuria and hematuria.  Musculoskeletal: Negative.  Negative for back pain and falls.  Skin: Negative.  Negative for rash.  Neurological: Negative.  Negative for sensory change, focal weakness and weakness.  Endo/Heme/Allergies: Bruises/bleeds easily.  Psychiatric/Behavioral: Positive for memory loss. Negative for depression. The patient is not nervous/anxious.     As per HPI. Otherwise, a complete review of systems is negative.  PAST MEDICAL HISTORY: Past Medical History:  Diagnosis Date  . Alzheimer disease ([place])   . Anemia   . Anxiety   . Asthma   . Chronic back pain   . Depression    . Depression   . GERD (gastroesophageal reflux disease)   . Hiatal hernia   . Hypercholesteremia   . ITP (idiopathic thrombocytopenic purpura)   . Osteoarthritis   . Osteoporosis   . Pulmonary emboli ([place])   . Restless legs     PAST SURGICAL HISTORY: Past Surgical History:  Procedure Laterality Date  . CARPAL TUNNEL RELEASE    . ESOPHAGOGASTRODUODENOSCOPY (EGD) WITH PROPOFOL N/A 09/10/2015   Procedure: ESOPHAGOGASTRODUODENOSCOPY (EGD) WITH PROPOFOL;  Surgeon: Lollie Sails, MD;  Location: [facility] ENDOSCOPY;  Service: Endoscopy;  Laterality: N/A;  . IRRIGATION AND DEBRIDEMENT HEMATOMA Right 07/13/2018   Procedure: IRRIGATION AND DEBRIDEMENT HEMATOMA-RIGHT SHIN;  Surgeon: Herbert Pun, MD;  Location: [facility] ORS;  Service: General;  Laterality: Right;  . LUMBAR DISC SURGERY    . SPLENECTOMY, PARTIAL    . TOTAL ABDOMINAL HYSTERECTOMY      FAMILY HISTORY Family History  Problem Relation Age of Onset  . Stroke Mother     GYNECOLOGIC HISTORY:  No LMP recorded. Patient is postmenopausal.     ADVANCED DIRECTIVES:    HEALTH MAINTENANCE: Social History   Tobacco Use  . Smoking status: Never Smoker  . Smokeless tobacco: Never Used  Substance Use Topics  . Alcohol use: No    Alcohol/week: 0.0 standard drinks  . Drug use: No     Allergies  Allergen Reactions  . Aspirin     Other reaction(s): Distress (finding)  . Diazepam     Other reaction(s): Itching of Skin sneezing  . Morphine     Other reaction(s): Itching of Skin  . Other     Other  reaction(s): Unknown seasonal allergies  . Tetanus Toxoids     Other reaction(s): Localized superficial swelling of skin    Current Outpatient Medications  Medication Sig Dispense Refill  . acetaminophen (TYLENOL) 325 MG tablet Take 2 tablets (650 mg total) by mouth every 6 (six) hours as needed for mild pain (or Fever >/= 101). 100 tablet 0  . albuterol (PROVENTIL HFA;VENTOLIN HFA) 108 (90 BASE) MCG/ACT inhaler Inhale 2  puffs into the lungs every 6 (six) hours as needed for wheezing or shortness of breath.    . ALPRAZolam (XANAX) 0.5 MG tablet Take 1 tablet (0.5 mg total) by mouth 3 (three) times daily as needed for anxiety. 20 tablet 0  . buPROPion (WELLBUTRIN XL) 150 MG 24 hr tablet Take 150 mg by mouth daily.    . Calcium Carbonate-Vitamin D (CALCIUM 600+D) 600-400 MG-UNIT per tablet Take 1 tablet by mouth daily.    . citalopram (CELEXA) 40 MG tablet Take 40 mg by mouth daily.    . Cyanocobalamin (RA VITAMIN B-12 TR) 1000 MCG TBCR Take 1,000 mcg by mouth daily.     Marland Kitchen donepezil (ARICEPT) 5 MG tablet Take 10 mg by mouth at bedtime.     . fluticasone (FLONASE) 50 MCG/ACT nasal spray Place 2 sprays into both nostrils daily.     . Fluticasone-Salmeterol (ADVAIR) 250-50 MCG/DOSE AEPB Inhale 1 puff into the lungs daily as needed.     . gabapentin (NEURONTIN) 300 MG capsule Take 300 mg by mouth 4 (four) times daily.    Marland Kitchen HYDROcodone-acetaminophen (NORCO) 7.5-325 MG tablet Take 1 tablet by mouth every 4 (four) hours as needed for moderate pain or severe pain. 25 tablet 0  . levothyroxine (SYNTHROID, LEVOTHROID) 50 MCG tablet Take 50 mcg by mouth daily before breakfast. Take 30 to 60 minutes before breakfast.    . memantine (NAMENDA) 5 MG tablet Take 5 mg by mouth 2 (two) times daily.     . montelukast (SINGULAIR) 10 MG tablet Take 10 mg by mouth at bedtime.     . Multiple Vitamins-Minerals (OCUVITE-LUTEIN PO) Take 1 capsule by mouth daily.     Marland Kitchen nystatin ointment (MYCOSTATIN) APPLY OINTMENT TOPICALLY TWICE DAILY  3  . oxybutynin (DITROPAN) 5 MG tablet Take 5 mg by mouth 3 (three) times daily.    . pantoprazole (PROTONIX) 20 MG tablet Take 20 mg by mouth 2 (two) times daily.     . pravastatin (PRAVACHOL) 20 MG tablet Take 20 mg by mouth daily.    . predniSONE (DELTASONE) 10 MG tablet Take 10 mg by mouth daily.   1  . SSD 1 % cream Apply topically 2 (two) times daily.  1  . traMADol (ULTRAM) 50 MG tablet Take 50 mg by  mouth 2 (two) times daily as needed.  5  . triamcinolone cream (KENALOG) 0.1 % Apply topically.    . vitamin C (ASCORBIC ACID) 500 MG tablet Take 500 mg by mouth daily.    . vitamin E (E-400) 400 UNIT capsule Take 400 Units by mouth daily.     No current facility-administered medications for this visit.     OBJECTIVE: BP 126/72 (BP Location: Left Arm, Patient Position: Sitting)   Pulse (!) 55   Temp (!) 96.4 F (35.8 C) (Tympanic)   Resp 18   Wt 143 lb 2 oz (64.9 kg)   BMI 23.46 kg/m    Body mass index is 23.46 kg/m.    ECOG FS:1 - Symptomatic but completely ambulatory  General:  Well-developed, well-nourished, no acute distress. Eyes: Pink conjunctiva, anicteric sclera. HEENT: Normocephalic, moist mucous membranes. Lungs: Clear to auscultation bilaterally. Heart: Regular rate and rhythm. No rubs, murmurs, or gallops. Abdomen: Soft, nontender, nondistended. No organomegaly noted, normoactive bowel sounds. Musculoskeletal: No edema, cyanosis, or clubbing. Neuro: Alert, answering all questions appropriately. Cranial nerves grossly intact. Skin: No rashes or petechiae noted. Psych: Normal affect.  LAB RESULTS:  Orders Only on 10/19/2018  Component Date Value Ref Range Status  . WBC 10/19/2018 12.8* 4.0 - 10.5 K/uL Final  . RBC 10/19/2018 3.94  3.87 - 5.11 MIL/uL Final  . Hemoglobin 10/19/2018 8.9* 12.0 - 15.0 g/dL Final   Comment: Reticulocyte Hemoglobin testing may be clinically indicated, consider ordering this additional test [id number]   . HCT 10/19/2018 30.1* 36.0 - 46.0 % Final  . MCV 10/19/2018 76.4* 80.0 - 100.0 fL Final  . MCH 10/19/2018 22.6* 26.0 - 34.0 pg Final  . MCHC 10/19/2018 29.6* 30.0 - 36.0 g/dL Final  . RDW 10/19/2018 20.1* 11.5 - 15.5 % Final  . Platelets 10/19/2018 91* 150 - 400 K/uL Final  . nRBC 10/19/2018 0.3* 0.0 - 0.2 % Final  . Neutrophils Relative % 10/19/2018 52  % Final  . Neutro Abs 10/19/2018 6.7  1.7 - 7.7 K/uL Final  . Lymphocytes  Relative 10/19/2018 34  % Final  . Lymphs Abs 10/19/2018 4.4* 0.7 - 4.0 K/uL Final  . Monocytes Relative 10/19/2018 10  % Final  . Monocytes Absolute 10/19/2018 1.3* 0.1 - 1.0 K/uL Final  . Eosinophils Relative 10/19/2018 2  % Final  . Eosinophils Absolute 10/19/2018 0.2  0.0 - 0.5 K/uL Final  . Basophils Relative 10/19/2018 1  % Final  . Basophils Absolute 10/19/2018 0.1  0.0 - 0.1 K/uL Final  . WBC Morphology 10/19/2018 DIFF CONFIRMED BY MANUAL   Final  . Smear Review 10/19/2018 GIANT PLATELETS SEEN   Corrected  . Immature Granulocytes 10/19/2018 1  % Final  . Abs Immature Granulocytes 10/19/2018 0.10* 0.00 - 0.07 K/uL Final   Performed at [facility], [address] [place], [place] [address]    STUDIES: No results found.  ASSESSMENT:  Chronic refractory ITP  PLAN:   1. Chronic refractory ITP: Patient had a poor response to Prednisone, WinRho, and Rituxan. She is status post splenectomy in 2007.  Patient's platelet count is improved from her baseline at 91, but still lower than 100 therefore we will proceed with 8 mcg/kg Nplate today.  Return to clinic in 4 weeks for further evaluation and continuation of treatment if her platelet count remains below 100. If patient becomes refractory to treatment, can consider Promacta 12.5mg  or Tavalisse 100 mg BID at a later date if necessary.  Max dose of Nplate is 10 mcg/kg. 2. Weakness and fatigue: Chronic and unchanged.   3. Depression/dementia: Chronic and unchanged.  Patient is now at [employer]. 4.  Falls:  Patient does not report any falls over the past month.  I spent a total of 20 minutes face-to-face with the patient of which greater than 50% of the visit was spent in counseling and coordination of care as detailed above.   Patient expressed understanding and was in agreement with this plan. She also understands that She can call clinic at any time with any questions, concerns, or complaints.    Lloyd Huger, MD    10/21/2018 1:30 PM

## 2018-10-14 NOTE — Progress Notes (Signed)
cb

## 2018-10-19 ENCOUNTER — Inpatient Hospital Stay: Payer: Medicare HMO

## 2018-10-19 ENCOUNTER — Other Ambulatory Visit: Payer: Self-pay

## 2018-10-19 ENCOUNTER — Encounter: Payer: Self-pay | Admitting: Oncology

## 2018-10-19 ENCOUNTER — Inpatient Hospital Stay: Payer: Medicare HMO | Admitting: Oncology

## 2018-10-19 ENCOUNTER — Inpatient Hospital Stay: Payer: Medicare HMO | Attending: Oncology

## 2018-10-19 VITALS — BP 126/72 | HR 55 | Temp 96.4°F | Resp 18 | Wt 143.1 lb

## 2018-10-19 DIAGNOSIS — D693 Immune thrombocytopenic purpura: Secondary | ICD-10-CM

## 2018-10-19 DIAGNOSIS — F0390 Unspecified dementia without behavioral disturbance: Secondary | ICD-10-CM

## 2018-10-19 DIAGNOSIS — F329 Major depressive disorder, single episode, unspecified: Secondary | ICD-10-CM | POA: Diagnosis not present

## 2018-10-19 DIAGNOSIS — R5382 Chronic fatigue, unspecified: Secondary | ICD-10-CM | POA: Diagnosis not present

## 2018-10-19 DIAGNOSIS — R531 Weakness: Secondary | ICD-10-CM

## 2018-10-19 DIAGNOSIS — R296 Repeated falls: Secondary | ICD-10-CM

## 2018-10-19 LAB — CBC WITH DIFFERENTIAL/PLATELET
Abs Immature Granulocytes: 0.1 10*3/uL — ABNORMAL HIGH (ref 0.00–0.07)
BASOS ABS: 0.1 10*3/uL (ref 0.0–0.1)
Basophils Relative: 1 %
EOS ABS: 0.2 10*3/uL (ref 0.0–0.5)
Eosinophils Relative: 2 %
HCT: 30.1 % — ABNORMAL LOW (ref 36.0–46.0)
HEMOGLOBIN: 8.9 g/dL — AB (ref 12.0–15.0)
Immature Granulocytes: 1 %
LYMPHS ABS: 4.4 10*3/uL — AB (ref 0.7–4.0)
Lymphocytes Relative: 34 %
MCH: 22.6 pg — ABNORMAL LOW (ref 26.0–34.0)
MCHC: 29.6 g/dL — ABNORMAL LOW (ref 30.0–36.0)
MCV: 76.4 fL — ABNORMAL LOW (ref 80.0–100.0)
Monocytes Absolute: 1.3 10*3/uL — ABNORMAL HIGH (ref 0.1–1.0)
Monocytes Relative: 10 %
Neutro Abs: 6.7 10*3/uL (ref 1.7–7.7)
Neutrophils Relative %: 52 %
PLATELETS: 91 10*3/uL — AB (ref 150–400)
RBC: 3.94 MIL/uL (ref 3.87–5.11)
RDW: 20.1 % — ABNORMAL HIGH (ref 11.5–15.5)
WBC: 12.8 10*3/uL — ABNORMAL HIGH (ref 4.0–10.5)
nRBC: 0.3 % — ABNORMAL HIGH (ref 0.0–0.2)

## 2018-10-19 MED ORDER — ROMIPLOSTIM INJECTION 500 MCG
500.0000 ug | Freq: Once | SUBCUTANEOUS | Status: AC
  Administered 2018-10-19: 500 ug via SUBCUTANEOUS
  Filled 2018-10-19: qty 1

## 2018-10-19 NOTE — Progress Notes (Signed)
Pt in for follow up, denies any difficulties or concerns.  

## 2018-10-20 DIAGNOSIS — S8011XD Contusion of right lower leg, subsequent encounter: Secondary | ICD-10-CM | POA: Diagnosis not present

## 2018-10-20 DIAGNOSIS — G8929 Other chronic pain: Secondary | ICD-10-CM | POA: Diagnosis not present

## 2018-10-20 DIAGNOSIS — M810 Age-related osteoporosis without current pathological fracture: Secondary | ICD-10-CM | POA: Diagnosis not present

## 2018-10-20 DIAGNOSIS — T8189XD Other complications of procedures, not elsewhere classified, subsequent encounter: Secondary | ICD-10-CM | POA: Diagnosis not present

## 2018-10-20 DIAGNOSIS — J45909 Unspecified asthma, uncomplicated: Secondary | ICD-10-CM | POA: Diagnosis not present

## 2018-10-20 DIAGNOSIS — F0280 Dementia in other diseases classified elsewhere without behavioral disturbance: Secondary | ICD-10-CM | POA: Diagnosis not present

## 2018-10-20 DIAGNOSIS — G309 Alzheimer's disease, unspecified: Secondary | ICD-10-CM | POA: Diagnosis not present

## 2018-10-20 DIAGNOSIS — M549 Dorsalgia, unspecified: Secondary | ICD-10-CM | POA: Diagnosis not present

## 2018-10-20 DIAGNOSIS — M1990 Unspecified osteoarthritis, unspecified site: Secondary | ICD-10-CM | POA: Diagnosis not present

## 2018-11-11 NOTE — Progress Notes (Deleted)
[place]  [phone]) [phone]  [phone]) [phone]     Ariana [name] DOB: [birth year]  MR#: [id number]  TJQ#:[id number]  Patient Care Team: Idelle Crouch, MD as PCP - General (Internal Medicine)  CHIEF COMPLAINT: Chronic refractory ITP  INTERVAL HISTORY: Patient returns to clinic today for further evaluation and continuation of Nplate.  She currently feels well and is asymptomatic.  She continues to have issues with memory. She does not report any increased bruising or bleeding over her baseline.  She has had no recent falls.  She has no neurologic complaints. She denies any fevers. She has no chest pain or shortness of breath. She denies any nausea, vomiting, constipation, or diarrhea.  She has no melena or hematochezia.  She has no urinary complaints.  Patient feels at her baseline offers no specific complaints today.  REVIEW OF SYSTEMS:   Review of Systems  Constitutional: Negative.  Negative for fever, malaise/fatigue and weight loss.  HENT: Negative for congestion.   Respiratory: Negative.  Negative for cough, hemoptysis and shortness of breath.   Cardiovascular: Negative.  Negative for chest pain and leg swelling.  Gastrointestinal: Negative.  Negative for abdominal pain, blood in stool, diarrhea, melena and nausea.  Genitourinary: Negative.  Negative for dysuria and hematuria.  Musculoskeletal: Negative.  Negative for back pain and falls.  Skin: Negative.  Negative for rash.  Neurological: Negative.  Negative for sensory change, focal weakness and weakness.  Endo/Heme/Allergies: Bruises/bleeds easily.  Psychiatric/Behavioral: Positive for memory loss. Negative for depression. The patient is not nervous/anxious.     As per HPI. Otherwise, a complete review of systems is negative.  PAST MEDICAL HISTORY: Past Medical History:  Diagnosis Date  . Alzheimer disease ([place])   . Anemia   . Anxiety   . Asthma   . Chronic back pain   . Depression    . Depression   . GERD (gastroesophageal reflux disease)   . Hiatal hernia   . Hypercholesteremia   . ITP (idiopathic thrombocytopenic purpura)   . Osteoarthritis   . Osteoporosis   . Pulmonary emboli ([place])   . Restless legs     PAST SURGICAL HISTORY: Past Surgical History:  Procedure Laterality Date  . CARPAL TUNNEL RELEASE    . ESOPHAGOGASTRODUODENOSCOPY (EGD) WITH PROPOFOL N/A 09/10/2015   Procedure: ESOPHAGOGASTRODUODENOSCOPY (EGD) WITH PROPOFOL;  Surgeon: Lollie Sails, MD;  Location: [facility] ENDOSCOPY;  Service: Endoscopy;  Laterality: N/A;  . IRRIGATION AND DEBRIDEMENT HEMATOMA Right 07/13/2018   Procedure: IRRIGATION AND DEBRIDEMENT HEMATOMA-RIGHT SHIN;  Surgeon: Herbert Pun, MD;  Location: [facility] ORS;  Service: General;  Laterality: Right;  . LUMBAR DISC SURGERY    . SPLENECTOMY, PARTIAL    . TOTAL ABDOMINAL HYSTERECTOMY      FAMILY HISTORY Family History  Problem Relation Age of Onset  . Stroke Mother     GYNECOLOGIC HISTORY:  No LMP recorded. Patient is postmenopausal.     ADVANCED DIRECTIVES:    HEALTH MAINTENANCE: Social History   Tobacco Use  . Smoking status: Never Smoker  . Smokeless tobacco: Never Used  Substance Use Topics  . Alcohol use: No    Alcohol/week: 0.0 standard drinks  . Drug use: No     Allergies  Allergen Reactions  . Aspirin     Other reaction(s): Distress (finding)  . Diazepam     Other reaction(s): Itching of Skin sneezing  . Morphine     Other reaction(s): Itching of Skin  . Other     Other  reaction(s): Unknown seasonal allergies  . Tetanus Toxoids     Other reaction(s): Localized superficial swelling of skin    Current Outpatient Medications  Medication Sig Dispense Refill  . acetaminophen (TYLENOL) 325 MG tablet Take 2 tablets (650 mg total) by mouth every 6 (six) hours as needed for mild pain (or Fever >/= 101). 100 tablet 0  . albuterol (PROVENTIL HFA;VENTOLIN HFA) 108 (90 BASE) MCG/ACT inhaler Inhale 2  puffs into the lungs every 6 (six) hours as needed for wheezing or shortness of breath.    . ALPRAZolam (XANAX) 0.5 MG tablet Take 1 tablet (0.5 mg total) by mouth 3 (three) times daily as needed for anxiety. 20 tablet 0  . buPROPion (WELLBUTRIN XL) 150 MG 24 hr tablet Take 150 mg by mouth daily.    . Calcium Carbonate-Vitamin D (CALCIUM 600+D) 600-400 MG-UNIT per tablet Take 1 tablet by mouth daily.    . citalopram (CELEXA) 40 MG tablet Take 40 mg by mouth daily.    . Cyanocobalamin (RA VITAMIN B-12 TR) 1000 MCG TBCR Take 1,000 mcg by mouth daily.     Marland Kitchen donepezil (ARICEPT) 5 MG tablet Take 10 mg by mouth at bedtime.     . fluticasone (FLONASE) 50 MCG/ACT nasal spray Place 2 sprays into both nostrils daily.     . Fluticasone-Salmeterol (ADVAIR) 250-50 MCG/DOSE AEPB Inhale 1 puff into the lungs daily as needed.     . gabapentin (NEURONTIN) 300 MG capsule Take 300 mg by mouth 4 (four) times daily.    Marland Kitchen HYDROcodone-acetaminophen (NORCO) 7.5-325 MG tablet Take 1 tablet by mouth every 4 (four) hours as needed for moderate pain or severe pain. 25 tablet 0  . levothyroxine (SYNTHROID, LEVOTHROID) 50 MCG tablet Take 50 mcg by mouth daily before breakfast. Take 30 to 60 minutes before breakfast.    . memantine (NAMENDA) 5 MG tablet Take 5 mg by mouth 2 (two) times daily.     . montelukast (SINGULAIR) 10 MG tablet Take 10 mg by mouth at bedtime.     . Multiple Vitamins-Minerals (OCUVITE-LUTEIN PO) Take 1 capsule by mouth daily.     Marland Kitchen nystatin ointment (MYCOSTATIN) APPLY OINTMENT TOPICALLY TWICE DAILY  3  . oxybutynin (DITROPAN) 5 MG tablet Take 5 mg by mouth 3 (three) times daily.    . pantoprazole (PROTONIX) 20 MG tablet Take 20 mg by mouth 2 (two) times daily.     . pravastatin (PRAVACHOL) 20 MG tablet Take 20 mg by mouth daily.    . predniSONE (DELTASONE) 10 MG tablet Take 10 mg by mouth daily.   1  . SSD 1 % cream Apply topically 2 (two) times daily.  1  . traMADol (ULTRAM) 50 MG tablet Take 50 mg by  mouth 2 (two) times daily as needed.  5  . triamcinolone cream (KENALOG) 0.1 % Apply topically.    . vitamin C (ASCORBIC ACID) 500 MG tablet Take 500 mg by mouth daily.    . vitamin E (E-400) 400 UNIT capsule Take 400 Units by mouth daily.     No current facility-administered medications for this visit.     OBJECTIVE: There were no vitals taken for this visit.   There is no height or weight on file to calculate BMI.    ECOG FS:1 - Symptomatic but completely ambulatory  General: Well-developed, well-nourished, no acute distress. Eyes: Pink conjunctiva, anicteric sclera. HEENT: Normocephalic, moist mucous membranes. Lungs: Clear to auscultation bilaterally. Heart: Regular rate and rhythm. No rubs, murmurs, or  gallops. Abdomen: Soft, nontender, nondistended. No organomegaly noted, normoactive bowel sounds. Musculoskeletal: No edema, cyanosis, or clubbing. Neuro: Alert, answering all questions appropriately. Cranial nerves grossly intact. Skin: No rashes or petechiae noted. Psych: Normal affect.  LAB RESULTS:  No visits with results within 3 Day(s) from this visit.  Latest known visit with results is:  Orders Only on 10/19/2018  Component Date Value Ref Range Status  . WBC 10/19/2018 12.8* 4.0 - 10.5 K/uL Final  . RBC 10/19/2018 3.94  3.87 - 5.11 MIL/uL Final  . Hemoglobin 10/19/2018 8.9* 12.0 - 15.0 g/dL Final   Comment: Reticulocyte Hemoglobin testing may be clinically indicated, consider ordering this additional test [id number]   . HCT 10/19/2018 30.1* 36.0 - 46.0 % Final  . MCV 10/19/2018 76.4* 80.0 - 100.0 fL Final  . MCH 10/19/2018 22.6* 26.0 - 34.0 pg Final  . MCHC 10/19/2018 29.6* 30.0 - 36.0 g/dL Final  . RDW 10/19/2018 20.1* 11.5 - 15.5 % Final  . Platelets 10/19/2018 91* 150 - 400 K/uL Final  . nRBC 10/19/2018 0.3* 0.0 - 0.2 % Final  . Neutrophils Relative % 10/19/2018 52  % Final  . Neutro Abs 10/19/2018 6.7  1.7 - 7.7 K/uL Final  . Lymphocytes Relative 10/19/2018  34  % Final  . Lymphs Abs 10/19/2018 4.4* 0.7 - 4.0 K/uL Final  . Monocytes Relative 10/19/2018 10  % Final  . Monocytes Absolute 10/19/2018 1.3* 0.1 - 1.0 K/uL Final  . Eosinophils Relative 10/19/2018 2  % Final  . Eosinophils Absolute 10/19/2018 0.2  0.0 - 0.5 K/uL Final  . Basophils Relative 10/19/2018 1  % Final  . Basophils Absolute 10/19/2018 0.1  0.0 - 0.1 K/uL Final  . WBC Morphology 10/19/2018 DIFF CONFIRMED BY MANUAL   Final  . Smear Review 10/19/2018 GIANT PLATELETS SEEN   Corrected  . Immature Granulocytes 10/19/2018 1  % Final  . Abs Immature Granulocytes 10/19/2018 0.10* 0.00 - 0.07 K/uL Final   Performed at [facility], [address] [place], [place] [address]    STUDIES: No results found.  ASSESSMENT:  Chronic refractory ITP  PLAN:   1. Chronic refractory ITP: Patient had a poor response to Prednisone, WinRho, and Rituxan. She is status post splenectomy in 2007.  Patient's platelet count is improved from her baseline at 91, but still lower than 100 therefore we will proceed with 8 mcg/kg Nplate today.  Return to clinic in 4 weeks for further evaluation and continuation of treatment if her platelet count remains below 100. If patient becomes refractory to treatment, can consider Promacta 12.5mg  or Tavalisse 100 mg BID at a later date if necessary.  Max dose of Nplate is 10 mcg/kg. 2. Weakness and fatigue: Chronic and unchanged.   3. Depression/dementia: Chronic and unchanged.  Patient is now at [employer]. 4.  Falls:  Patient does not report any falls over the past month.  I spent a total of 20 minutes face-to-face with the patient of which greater than 50% of the visit was spent in counseling and coordination of care as detailed above.   Patient expressed understanding and was in agreement with this plan. She also understands that She can call clinic at any time with any questions, concerns, or complaints.    Lloyd Huger, MD   11/11/2018 11:34  AM

## 2018-11-15 ENCOUNTER — Inpatient Hospital Stay: Payer: Medicare HMO | Attending: Oncology

## 2018-11-15 ENCOUNTER — Ambulatory Visit: Payer: Medicare HMO | Admitting: Oncology

## 2018-11-15 ENCOUNTER — Other Ambulatory Visit: Payer: Self-pay

## 2018-11-15 ENCOUNTER — Other Ambulatory Visit: Payer: Medicare HMO

## 2018-11-15 ENCOUNTER — Inpatient Hospital Stay (HOSPITAL_BASED_OUTPATIENT_CLINIC_OR_DEPARTMENT_OTHER): Payer: Medicare HMO | Admitting: Oncology

## 2018-11-15 ENCOUNTER — Inpatient Hospital Stay: Payer: Medicare HMO

## 2018-11-15 ENCOUNTER — Ambulatory Visit: Payer: Medicare HMO

## 2018-11-15 VITALS — BP 144/58 | HR 52 | Temp 97.5°F | Wt 142.3 lb

## 2018-11-15 DIAGNOSIS — Z9081 Acquired absence of spleen: Secondary | ICD-10-CM

## 2018-11-15 DIAGNOSIS — Z79899 Other long term (current) drug therapy: Secondary | ICD-10-CM | POA: Insufficient documentation

## 2018-11-15 DIAGNOSIS — F329 Major depressive disorder, single episode, unspecified: Secondary | ICD-10-CM

## 2018-11-15 DIAGNOSIS — D693 Immune thrombocytopenic purpura: Secondary | ICD-10-CM | POA: Diagnosis present

## 2018-11-15 DIAGNOSIS — G309 Alzheimer's disease, unspecified: Secondary | ICD-10-CM | POA: Insufficient documentation

## 2018-11-15 LAB — CBC WITH DIFFERENTIAL/PLATELET
Abs Immature Granulocytes: 0.08 10*3/uL — ABNORMAL HIGH (ref 0.00–0.07)
BASOS PCT: 1 %
Basophils Absolute: 0.1 10*3/uL (ref 0.0–0.1)
EOS ABS: 0.4 10*3/uL (ref 0.0–0.5)
EOS PCT: 4 %
HEMATOCRIT: 29.9 % — AB (ref 36.0–46.0)
Hemoglobin: 8.5 g/dL — ABNORMAL LOW (ref 12.0–15.0)
Immature Granulocytes: 1 %
LYMPHS ABS: 3.6 10*3/uL (ref 0.7–4.0)
Lymphocytes Relative: 34 %
MCH: 21.9 pg — AB (ref 26.0–34.0)
MCHC: 28.4 g/dL — ABNORMAL LOW (ref 30.0–36.0)
MCV: 77.1 fL — AB (ref 80.0–100.0)
MONO ABS: 1.2 10*3/uL — AB (ref 0.1–1.0)
Monocytes Relative: 12 %
Neutro Abs: 5.4 10*3/uL (ref 1.7–7.7)
Neutrophils Relative %: 48 %
Platelets: 81 10*3/uL — ABNORMAL LOW (ref 150–400)
RBC: 3.88 MIL/uL (ref 3.87–5.11)
RDW: 21.2 % — AB (ref 11.5–15.5)
WBC: 10.8 10*3/uL — ABNORMAL HIGH (ref 4.0–10.5)
nRBC: 0.6 % — ABNORMAL HIGH (ref 0.0–0.2)

## 2018-11-15 MED ORDER — ROMIPLOSTIM INJECTION 500 MCG
500.0000 ug | Freq: Once | SUBCUTANEOUS | Status: AC
  Administered 2018-11-15: 500 ug via SUBCUTANEOUS
  Filled 2018-11-15: qty 1

## 2018-11-15 NOTE — Progress Notes (Signed)
[place]  Telephone:(336) [phone]  [phone]) [phone]     Ariana [name] DOB: 1933/03/18  MR#: [id number]  KDX#:[id number]  Patient Care Team: Idelle Crouch, MD as PCP - General (Internal Medicine)  CHIEF COMPLAINT: Chronic refractory ITP  INTERVAL HISTORY: Patient returns to clinic today for further evaluation and continuation of Nplate.  She continues to feel well and remains asymptomatic.  She denies any additional bleeding or bruising. She continues to have issues with memory. She has had no recent falls.  She has no neurologic complaints. She denies any fevers. She has no chest pain or shortness of breath. She denies any nausea, vomiting, constipation, or diarrhea.  She has no melena or hematochezia.  She has no urinary complaints.  Patient feels at her baseline offers no specific complaints today.  REVIEW OF SYSTEMS:   Review of Systems  Constitutional: Negative.  Negative for fever, malaise/fatigue and weight loss.  HENT: Negative for congestion.   Respiratory: Negative.  Negative for cough, hemoptysis and shortness of breath.   Cardiovascular: Negative.  Negative for chest pain and leg swelling.  Gastrointestinal: Negative.  Negative for abdominal pain, blood in stool, diarrhea, melena and nausea.  Genitourinary: Negative.  Negative for dysuria and hematuria.  Musculoskeletal: Negative.  Negative for back pain and falls.  Skin: Negative.  Negative for rash.  Neurological: Negative.  Negative for sensory change, focal weakness and weakness.  Endo/Heme/Allergies: Bruises/bleeds easily.  Psychiatric/Behavioral: Positive for memory loss. Negative for depression. The patient is not nervous/anxious.     As per HPI. Otherwise, a complete review of systems is negative.  PAST MEDICAL HISTORY: Past Medical History:  Diagnosis Date  . Alzheimer disease ([place])   . Anemia   . Anxiety   . Asthma   . Chronic back pain   . Depression   . Depression   .  GERD (gastroesophageal reflux disease)   . Hiatal hernia   . Hypercholesteremia   . ITP (idiopathic thrombocytopenic purpura)   . Osteoarthritis   . Osteoporosis   . Pulmonary emboli ([place])   . Restless legs     PAST SURGICAL HISTORY: Past Surgical History:  Procedure Laterality Date  . CARPAL TUNNEL RELEASE    . ESOPHAGOGASTRODUODENOSCOPY (EGD) WITH PROPOFOL N/A 09/10/2015   Procedure: ESOPHAGOGASTRODUODENOSCOPY (EGD) WITH PROPOFOL;  Surgeon: Lollie Sails, MD;  Location: [facility] ENDOSCOPY;  Service: Endoscopy;  Laterality: N/A;  . IRRIGATION AND DEBRIDEMENT HEMATOMA Right 07/13/2018   Procedure: IRRIGATION AND DEBRIDEMENT HEMATOMA-RIGHT SHIN;  Surgeon: Herbert Pun, MD;  Location: [facility] ORS;  Service: General;  Laterality: Right;  . LUMBAR DISC SURGERY    . SPLENECTOMY, PARTIAL    . TOTAL ABDOMINAL HYSTERECTOMY      FAMILY HISTORY Family History  Problem Relation Age of Onset  . Stroke Mother     GYNECOLOGIC HISTORY:  No LMP recorded. Patient is postmenopausal.     ADVANCED DIRECTIVES:    HEALTH MAINTENANCE: Social History   Tobacco Use  . Smoking status: Never Smoker  . Smokeless tobacco: Never Used  Substance Use Topics  . Alcohol use: No    Alcohol/week: 0.0 standard drinks  . Drug use: No     Allergies  Allergen Reactions  . Aspirin     Other reaction(s): Distress (finding)  . Diazepam     Other reaction(s): Itching of Skin sneezing  . Morphine     Other reaction(s): Itching of Skin  . Other     Other reaction(s): Unknown seasonal allergies  .  Tetanus Toxoids     Other reaction(s): Localized superficial swelling of skin    Current Outpatient Medications  Medication Sig Dispense Refill  . acetaminophen (TYLENOL) 325 MG tablet Take 2 tablets (650 mg total) by mouth every 6 (six) hours as needed for mild pain (or Fever >/= 101). 100 tablet 0  . albuterol (PROVENTIL HFA;VENTOLIN HFA) 108 (90 BASE) MCG/ACT inhaler Inhale 2 puffs into the  lungs every 6 (six) hours as needed for wheezing or shortness of breath.    . ALPRAZolam (XANAX) 0.5 MG tablet Take 1 tablet (0.5 mg total) by mouth 3 (three) times daily as needed for anxiety. 20 tablet 0  . buPROPion (WELLBUTRIN XL) 150 MG 24 hr tablet Take 150 mg by mouth daily.    . Calcium Carbonate-Vitamin D (CALCIUM 600+D) 600-400 MG-UNIT per tablet Take 1 tablet by mouth daily.    . citalopram (CELEXA) 40 MG tablet Take 40 mg by mouth daily.    . Cyanocobalamin (RA VITAMIN B-12 TR) 1000 MCG TBCR Take 1,000 mcg by mouth daily.     Marland Kitchen donepezil (ARICEPT) 5 MG tablet Take 10 mg by mouth at bedtime.     . fluticasone (FLONASE) 50 MCG/ACT nasal spray Place 2 sprays into both nostrils daily.     . Fluticasone-Salmeterol (ADVAIR) 250-50 MCG/DOSE AEPB Inhale 1 puff into the lungs daily as needed.     . gabapentin (NEURONTIN) 300 MG capsule Take 300 mg by mouth 4 (four) times daily.    Marland Kitchen HYDROcodone-acetaminophen (NORCO) 7.5-325 MG tablet Take 1 tablet by mouth every 4 (four) hours as needed for moderate pain or severe pain. 25 tablet 0  . levothyroxine (SYNTHROID, LEVOTHROID) 50 MCG tablet Take 50 mcg by mouth daily before breakfast. Take 30 to 60 minutes before breakfast.    . montelukast (SINGULAIR) 10 MG tablet Take 10 mg by mouth at bedtime.     . Multiple Vitamins-Minerals (OCUVITE-LUTEIN PO) Take 1 capsule by mouth daily.     Marland Kitchen nystatin ointment (MYCOSTATIN) APPLY OINTMENT TOPICALLY TWICE DAILY  3  . oxybutynin (DITROPAN) 5 MG tablet Take 5 mg by mouth 3 (three) times daily.    . pantoprazole (PROTONIX) 20 MG tablet Take 20 mg by mouth 2 (two) times daily.     . pravastatin (PRAVACHOL) 20 MG tablet Take 20 mg by mouth daily.    . predniSONE (DELTASONE) 10 MG tablet Take 10 mg by mouth daily.   1  . SSD 1 % cream Apply topically 2 (two) times daily.  1  . traMADol (ULTRAM) 50 MG tablet Take 50 mg by mouth 2 (two) times daily as needed.  5  . triamcinolone cream (KENALOG) 0.1 % Apply  topically.    . vitamin C (ASCORBIC ACID) 500 MG tablet Take 500 mg by mouth daily.    . vitamin E (E-400) 400 UNIT capsule Take 400 Units by mouth daily.    . busPIRone (BUSPAR) 15 MG tablet Take 1 tablet by mouth 1 day or 1 dose.    . memantine (NAMENDA) 5 MG tablet Take 5 mg by mouth 2 (two) times daily.     . memantine (NAMENDA) 5 MG tablet Take 1 tablet by mouth 1 day or 1 dose.    Marland Kitchen NARCAN 4 MG/0.1ML LIQD nasal spray kit CALL 911. ADMINISTER A SINGLE SPRAY OF NARCAN IN ONE NOSTRIL. REPEAT EVERY 3 MINUTES AS NEEDED IF NO OR MINIMAL RESPONSE.     No current facility-administered medications for this visit.  OBJECTIVE: BP (!) 144/58 (BP Location: Left Arm, Patient Position: Sitting)   Pulse (!) 52   Temp (!) 97.5 F (36.4 C) (Tympanic)   Wt 142 lb 4.8 oz (64.5 kg)   BMI 23.32 kg/m    Body mass index is 23.32 kg/m.    ECOG FS:1 - Symptomatic but completely ambulatory  General: Well-developed, well-nourished, no acute distress. Eyes: Pink conjunctiva, anicteric sclera. HEENT: Normocephalic, moist mucous membranes. Lungs: Clear to auscultation bilaterally. Heart: Regular rate and rhythm. No rubs, murmurs, or gallops. Abdomen: Soft, nontender, nondistended. No organomegaly noted, normoactive bowel sounds. Musculoskeletal: No edema, cyanosis, or clubbing. Neuro: Alert, answering all questions appropriately. Cranial nerves grossly intact. Skin: No rashes or petechiae noted. Psych: Normal affect.  LAB RESULTS:  Appointment on 11/15/2018  Component Date Value Ref Range Status  . WBC 11/15/2018 10.8* 4.0 - 10.5 K/uL Final  . RBC 11/15/2018 3.88  3.87 - 5.11 MIL/uL Final  . Hemoglobin 11/15/2018 8.5* 12.0 - 15.0 g/dL Final   Comment: Reticulocyte Hemoglobin testing may be clinically indicated, consider ordering this additional test [id number]   . HCT 11/15/2018 29.9* 36.0 - 46.0 % Final  . MCV 11/15/2018 77.1* 80.0 - 100.0 fL Final  . MCH 11/15/2018 21.9* 26.0 - 34.0 pg Final    . MCHC 11/15/2018 28.4* 30.0 - 36.0 g/dL Final  . RDW 11/15/2018 21.2* 11.5 - 15.5 % Final  . Platelets 11/15/2018 81* 150 - 400 K/uL Final  . nRBC 11/15/2018 0.6* 0.0 - 0.2 % Final  . Neutrophils Relative % 11/15/2018 48  % Final  . Neutro Abs 11/15/2018 5.4  1.7 - 7.7 K/uL Final  . Lymphocytes Relative 11/15/2018 34  % Final  . Lymphs Abs 11/15/2018 3.6  0.7 - 4.0 K/uL Final  . Monocytes Relative 11/15/2018 12  % Final  . Monocytes Absolute 11/15/2018 1.2* 0.1 - 1.0 K/uL Final  . Eosinophils Relative 11/15/2018 4  % Final  . Eosinophils Absolute 11/15/2018 0.4  0.0 - 0.5 K/uL Final  . Basophils Relative 11/15/2018 1  % Final  . Basophils Absolute 11/15/2018 0.1  0.0 - 0.1 K/uL Final  . Immature Granulocytes 11/15/2018 1  % Final  . Abs Immature Granulocytes 11/15/2018 0.08* 0.00 - 0.07 K/uL Final   Performed at [facility], [address] [place], [place] [address]    STUDIES: No results found.  ASSESSMENT:  Chronic refractory ITP  PLAN:   1. Chronic refractory ITP: Patient had a poor response to Prednisone, WinRho, and Rituxan. She is status post splenectomy in 2007.  Patient's platelet count remains decreased at 81, therefore proceed with 8 mcg/kg Nplate today.  Will continue to treat as long as patient's platelet count remains below 100.  Return to clinic in 4 weeks for further evaluation and continuation of treatment. If patient becomes refractory to treatment, can consider Promacta 12.'5mg'$  or Tavalisse 100 mg BID at a later date if necessary.  Max dose of Nplate is 10 mcg/kg. 2. Weakness and fatigue: Patient does not complain of this today.   3. Depression/dementia: Chronic and unchanged.  Patient is now at [employer]. 4.  Falls:  Patient does not report any falls over the past month.  I spent a total of 30 minutes face-to-face with the patient of which greater than 50% of the visit was spent in counseling and coordination of care as detailed above.   Patient  expressed understanding and was in agreement with this plan. She also understands that She can call clinic  at any time with any questions, concerns, or complaints.    Lloyd Huger, MD   11/15/2018 2:24 PM

## 2018-11-16 ENCOUNTER — Other Ambulatory Visit: Payer: Medicare HMO

## 2018-11-16 ENCOUNTER — Ambulatory Visit: Payer: Medicare HMO | Admitting: Oncology

## 2018-11-16 ENCOUNTER — Ambulatory Visit: Payer: Medicare HMO

## 2018-12-12 NOTE — Progress Notes (Signed)
[place]  Telephone:(336) [phone]  Fax:(336) [phone]     Ariana [name] DOB: 10/09/1933  MR#: [id number]  ZJI#:[id number]  Patient Care Team: Idelle Crouch, MD as PCP - General (Internal Medicine)  CHIEF COMPLAINT: Chronic refractory ITP  INTERVAL HISTORY: Patient returns to clinic today for further evaluation and continuation of Nplate.  She continues to feel well and remains asymptomatic.  She is noticed decreased bruising over the past several weeks and denies any bleeding. She continues to have issues with memory. She has had no recent falls.  She has no neurologic complaints. She denies any fevers. She has no chest pain or shortness of breath. She denies any nausea, vomiting, constipation, or diarrhea.  She has no melena or hematochezia.  She has no urinary complaints.  Patient offers no specific complaints today.    REVIEW OF SYSTEMS:   Review of Systems  Constitutional: Negative.  Negative for fever, malaise/fatigue and weight loss.  HENT: Negative for congestion.   Respiratory: Negative.  Negative for cough, hemoptysis and shortness of breath.   Cardiovascular: Negative.  Negative for chest pain and leg swelling.  Gastrointestinal: Negative.  Negative for abdominal pain, blood in stool, diarrhea, melena and nausea.  Genitourinary: Negative.  Negative for dysuria and hematuria.  Musculoskeletal: Negative.  Negative for back pain and falls.  Skin: Negative.  Negative for rash.  Neurological: Negative.  Negative for sensory change, focal weakness and weakness.  Endo/Heme/Allergies: Negative.  Does not bruise/bleed easily.  Psychiatric/Behavioral: Positive for memory loss. Negative for depression. The patient is not nervous/anxious.     As per HPI. Otherwise, a complete review of systems is negative.  PAST MEDICAL HISTORY: Past Medical History:  Diagnosis Date  . Alzheimer disease ([place])   . Anemia   . Anxiety   . Asthma   . Chronic back pain    . Depression   . Depression   . GERD (gastroesophageal reflux disease)   . Hiatal hernia   . Hypercholesteremia   . ITP (idiopathic thrombocytopenic purpura)   . Osteoarthritis   . Osteoporosis   . Pulmonary emboli ([place])   . Restless legs     PAST SURGICAL HISTORY: Past Surgical History:  Procedure Laterality Date  . CARPAL TUNNEL RELEASE    . ESOPHAGOGASTRODUODENOSCOPY (EGD) WITH PROPOFOL N/A 09/10/2015   Procedure: ESOPHAGOGASTRODUODENOSCOPY (EGD) WITH PROPOFOL;  Surgeon: Lollie Sails, MD;  Location: [facility] ENDOSCOPY;  Service: Endoscopy;  Laterality: N/A;  . IRRIGATION AND DEBRIDEMENT HEMATOMA Right 07/13/2018   Procedure: IRRIGATION AND DEBRIDEMENT HEMATOMA-RIGHT SHIN;  Surgeon: Herbert Pun, MD;  Location: [facility] ORS;  Service: General;  Laterality: Right;  . LUMBAR DISC SURGERY    . SPLENECTOMY, PARTIAL    . TOTAL ABDOMINAL HYSTERECTOMY      FAMILY HISTORY Family History  Problem Relation Age of Onset  . Stroke Mother     GYNECOLOGIC HISTORY:  No LMP recorded. Patient is postmenopausal.     ADVANCED DIRECTIVES:    HEALTH MAINTENANCE: Social History   Tobacco Use  . Smoking status: Never Smoker  . Smokeless tobacco: Never Used  Substance Use Topics  . Alcohol use: No    Alcohol/week: 0.0 standard drinks  . Drug use: No     Allergies  Allergen Reactions  . Aspirin     Other reaction(s): Distress (finding)  . Diazepam     Other reaction(s): Itching of Skin sneezing  . Morphine     Other reaction(s): Itching of Skin  . Other  Other reaction(s): Unknown seasonal allergies  . Tetanus Toxoids     Other reaction(s): Localized superficial swelling of skin    Current Outpatient Medications  Medication Sig Dispense Refill  . acetaminophen (TYLENOL) 325 MG tablet Take 2 tablets (650 mg total) by mouth every 6 (six) hours as needed for mild pain (or Fever >/= 101). 100 tablet 0  . albuterol (PROVENTIL HFA;VENTOLIN HFA) 108 (90 BASE) MCG/ACT  inhaler Inhale 2 puffs into the lungs every 6 (six) hours as needed for wheezing or shortness of breath.    . ALPRAZolam (XANAX) 0.5 MG tablet Take 1 tablet (0.5 mg total) by mouth 3 (three) times daily as needed for anxiety. 20 tablet 0  . buPROPion (WELLBUTRIN XL) 150 MG 24 hr tablet Take 150 mg by mouth daily.    . busPIRone (BUSPAR) 15 MG tablet Take 1 tablet by mouth 1 day or 1 dose.    . Calcium Carbonate-Vitamin D (CALCIUM 600+D) 600-400 MG-UNIT per tablet Take 1 tablet by mouth daily.    . citalopram (CELEXA) 40 MG tablet Take 40 mg by mouth daily.    . Cyanocobalamin (RA VITAMIN B-12 TR) 1000 MCG TBCR Take 1,000 mcg by mouth daily.     Marland Kitchen donepezil (ARICEPT) 5 MG tablet Take 10 mg by mouth at bedtime.     . fluticasone (FLONASE) 50 MCG/ACT nasal spray Place 2 sprays into both nostrils daily.     . Fluticasone-Salmeterol (ADVAIR) 250-50 MCG/DOSE AEPB Inhale 1 puff into the lungs daily as needed.     . gabapentin (NEURONTIN) 300 MG capsule Take 300 mg by mouth 4 (four) times daily.    Marland Kitchen HYDROcodone-acetaminophen (NORCO) 7.5-325 MG tablet Take 1 tablet by mouth every 4 (four) hours as needed for moderate pain or severe pain. 25 tablet 0  . levothyroxine (SYNTHROID, LEVOTHROID) 50 MCG tablet Take 50 mcg by mouth daily before breakfast. Take 30 to 60 minutes before breakfast.    . memantine (NAMENDA) 5 MG tablet Take 1 tablet by mouth 1 day or 1 dose.    . montelukast (SINGULAIR) 10 MG tablet Take 10 mg by mouth at bedtime.     . Multiple Vitamins-Minerals (OCUVITE-LUTEIN PO) Take 1 capsule by mouth daily.     Marland Kitchen NARCAN 4 MG/0.1ML LIQD nasal spray kit CALL 911. ADMINISTER A SINGLE SPRAY OF NARCAN IN ONE NOSTRIL. REPEAT EVERY 3 MINUTES AS NEEDED IF NO OR MINIMAL RESPONSE.    Marland Kitchen nystatin ointment (MYCOSTATIN) APPLY OINTMENT TOPICALLY TWICE DAILY  3  . oxybutynin (DITROPAN) 5 MG tablet Take 5 mg by mouth 3 (three) times daily.    . pantoprazole (PROTONIX) 20 MG tablet Take 20 mg by mouth 2 (two)  times daily.     . pravastatin (PRAVACHOL) 20 MG tablet Take 20 mg by mouth daily.    . predniSONE (DELTASONE) 10 MG tablet Take 10 mg by mouth daily.   1  . SSD 1 % cream Apply topically 2 (two) times daily.  1  . traMADol (ULTRAM) 50 MG tablet Take 50 mg by mouth 2 (two) times daily as needed.  5  . triamcinolone cream (KENALOG) 0.1 % Apply topically.    . vitamin C (ASCORBIC ACID) 500 MG tablet Take 500 mg by mouth daily.    . vitamin E (E-400) 400 UNIT capsule Take 400 Units by mouth daily.    . memantine (NAMENDA) 5 MG tablet Take 5 mg by mouth 2 (two) times daily.      No current  facility-administered medications for this visit.     OBJECTIVE: BP (!) 161/76 (BP Location: Left Arm, Patient Position: Sitting)   Pulse 64   Temp (!) 97.4 F (36.3 C) (Tympanic)   Wt 138 lb (62.6 kg)   BMI 22.62 kg/m    Body mass index is 22.62 kg/m.    ECOG FS:1 - Symptomatic but completely ambulatory  General: Well-developed, well-nourished, no acute distress. Eyes: Pink conjunctiva, anicteric sclera. HEENT: Normocephalic, moist mucous membranes. Lungs: Clear to auscultation bilaterally. Heart: Regular rate and rhythm. No rubs, murmurs, or gallops. Abdomen: Soft, nontender, nondistended. No organomegaly noted, normoactive bowel sounds. Musculoskeletal: No edema, cyanosis, or clubbing. Neuro: Alert, answering all questions appropriately. Cranial nerves grossly intact. Skin: No rashes or petechiae noted. Psych: Normal affect.  LAB RESULTS:  Orders Only on 12/13/2018  Component Date Value Ref Range Status  . WBC 12/13/2018 10.9* 4.0 - 10.5 K/uL Final  . RBC 12/13/2018 4.36  3.87 - 5.11 MIL/uL Final  . Hemoglobin 12/13/2018 9.8* 12.0 - 15.0 g/dL Final   Comment: Reticulocyte Hemoglobin testing may be clinically indicated, consider ordering this additional test [id number]   . HCT 12/13/2018 32.5* 36.0 - 46.0 % Final  . MCV 12/13/2018 74.5* 80.0 - 100.0 fL Final  . MCH 12/13/2018 22.5* 26.0  - 34.0 pg Final  . MCHC 12/13/2018 30.2  30.0 - 36.0 g/dL Final  . RDW 12/13/2018 20.5* 11.5 - 15.5 % Final  . Platelets 12/13/2018 143* 150 - 400 K/uL Final   Comment: REPEATED TO VERIFY PLATELET COUNT CONFIRMED BY SMEAR PLATELETS APPEAR DECREASED   . nRBC 12/13/2018 0.2  0.0 - 0.2 % Final  . Neutrophils Relative % 12/13/2018 58  % Final  . Neutro Abs 12/13/2018 6.4  1.7 - 7.7 K/uL Final  . Lymphocytes Relative 12/13/2018 29  % Final  . Lymphs Abs 12/13/2018 3.2  0.7 - 4.0 K/uL Final  . Monocytes Relative 12/13/2018 10  % Final  . Monocytes Absolute 12/13/2018 1.1* 0.1 - 1.0 K/uL Final  . Eosinophils Relative 12/13/2018 2  % Final  . Eosinophils Absolute 12/13/2018 0.2  0.0 - 0.5 K/uL Final  . Basophils Relative 12/13/2018 1  % Final  . Basophils Absolute 12/13/2018 0.1  0.0 - 0.1 K/uL Final  . Immature Granulocytes 12/13/2018 0  % Final  . Abs Immature Granulocytes 12/13/2018 0.04  0.00 - 0.07 K/uL Final  . Giant PLTs 12/13/2018 PRESENT   Final   Performed at [facility], [address] [place],  [address]    STUDIES: No results found.  ASSESSMENT:  Chronic refractory ITP  PLAN:   1. Chronic refractory ITP: Patient had a poor response to Prednisone, WinRho, and Rituxan. She is status post splenectomy in 2007.  Patient's platelet count has significantly improved and is now 143.  She does not require 8 mcg/kg Nplate today.  Will continue to treat as long as patient's platelet count remains below 100.  Return to clinic in 2 weeks for laboratory check and then in 4 weeks for further evaluation and continuation of treatment if necessary. If patient becomes refractory to treatment, can consider Promacta 12.61m or Tavalisse 100 mg BID at a later date if necessary.  Max dose of Nplate is 10 mcg/kg. 2. Weakness and fatigue: Patient does not complain of this today.   3. Depression/dementia: Chronic and unchanged.  Patient is now at [employer].  Falls: Patient has no  reported falls recently.  I spent a total of 30 minutes  face-to-face with the patient of which greater than 50% of the visit was spent in counseling and coordination of care as detailed above.   Patient expressed understanding and was in agreement with this plan. She also understands that She can call clinic at any time with any questions, concerns, or complaints.    Lloyd Huger, MD   12/14/2018 5:15 PM

## 2018-12-13 ENCOUNTER — Other Ambulatory Visit: Payer: Self-pay

## 2018-12-13 ENCOUNTER — Inpatient Hospital Stay: Payer: Medicare HMO

## 2018-12-13 ENCOUNTER — Inpatient Hospital Stay: Payer: Medicare HMO | Attending: Oncology

## 2018-12-13 ENCOUNTER — Encounter: Payer: Self-pay | Admitting: Oncology

## 2018-12-13 ENCOUNTER — Inpatient Hospital Stay (HOSPITAL_BASED_OUTPATIENT_CLINIC_OR_DEPARTMENT_OTHER): Payer: Medicare HMO | Admitting: Oncology

## 2018-12-13 VITALS — BP 161/76 | HR 64 | Temp 97.4°F | Wt 138.0 lb

## 2018-12-13 DIAGNOSIS — D693 Immune thrombocytopenic purpura: Secondary | ICD-10-CM | POA: Insufficient documentation

## 2018-12-13 DIAGNOSIS — F329 Major depressive disorder, single episode, unspecified: Secondary | ICD-10-CM

## 2018-12-13 DIAGNOSIS — F0390 Unspecified dementia without behavioral disturbance: Secondary | ICD-10-CM | POA: Insufficient documentation

## 2018-12-13 DIAGNOSIS — R531 Weakness: Secondary | ICD-10-CM | POA: Diagnosis not present

## 2018-12-13 DIAGNOSIS — R5383 Other fatigue: Secondary | ICD-10-CM | POA: Diagnosis not present

## 2018-12-13 DIAGNOSIS — R296 Repeated falls: Secondary | ICD-10-CM

## 2018-12-13 LAB — CBC WITH DIFFERENTIAL/PLATELET
Abs Immature Granulocytes: 0.04 10*3/uL (ref 0.00–0.07)
BASOS ABS: 0.1 10*3/uL (ref 0.0–0.1)
Basophils Relative: 1 %
Eosinophils Absolute: 0.2 10*3/uL (ref 0.0–0.5)
Eosinophils Relative: 2 %
HCT: 32.5 % — ABNORMAL LOW (ref 36.0–46.0)
Hemoglobin: 9.8 g/dL — ABNORMAL LOW (ref 12.0–15.0)
Immature Granulocytes: 0 %
Lymphocytes Relative: 29 %
Lymphs Abs: 3.2 10*3/uL (ref 0.7–4.0)
MCH: 22.5 pg — ABNORMAL LOW (ref 26.0–34.0)
MCHC: 30.2 g/dL (ref 30.0–36.0)
MCV: 74.5 fL — ABNORMAL LOW (ref 80.0–100.0)
Monocytes Absolute: 1.1 10*3/uL — ABNORMAL HIGH (ref 0.1–1.0)
Monocytes Relative: 10 %
Neutro Abs: 6.4 10*3/uL (ref 1.7–7.7)
Neutrophils Relative %: 58 %
Platelets: 143 10*3/uL — ABNORMAL LOW (ref 150–400)
RBC: 4.36 MIL/uL (ref 3.87–5.11)
RDW: 20.5 % — AB (ref 11.5–15.5)
WBC: 10.9 10*3/uL — ABNORMAL HIGH (ref 4.0–10.5)
nRBC: 0.2 % (ref 0.0–0.2)

## 2018-12-13 NOTE — Progress Notes (Signed)
Patient is here today to follow up on his chronic ITP. Patient stated that she had been doing well despite the fact that her appetite is not so good. Patient stated that she tries to eat the most she can but she's just not that hungry.

## 2018-12-15 ENCOUNTER — Ambulatory Visit: Payer: Medicare HMO | Admitting: Podiatry

## 2018-12-27 ENCOUNTER — Inpatient Hospital Stay: Payer: Medicare HMO

## 2018-12-27 ENCOUNTER — Other Ambulatory Visit: Payer: Self-pay

## 2018-12-27 DIAGNOSIS — D693 Immune thrombocytopenic purpura: Secondary | ICD-10-CM | POA: Diagnosis not present

## 2018-12-27 LAB — CBC WITH DIFFERENTIAL/PLATELET
Abs Immature Granulocytes: 0.02 10*3/uL (ref 0.00–0.07)
BASOS PCT: 1 %
Basophils Absolute: 0 10*3/uL (ref 0.0–0.1)
Eosinophils Absolute: 0.2 10*3/uL (ref 0.0–0.5)
Eosinophils Relative: 2 %
HCT: 35 % — ABNORMAL LOW (ref 36.0–46.0)
Hemoglobin: 10.4 g/dL — ABNORMAL LOW (ref 12.0–15.0)
Immature Granulocytes: 0 %
Lymphocytes Relative: 25 %
Lymphs Abs: 2.2 10*3/uL (ref 0.7–4.0)
MCH: 22.5 pg — ABNORMAL LOW (ref 26.0–34.0)
MCHC: 29.7 g/dL — ABNORMAL LOW (ref 30.0–36.0)
MCV: 75.6 fL — ABNORMAL LOW (ref 80.0–100.0)
Monocytes Absolute: 0.8 10*3/uL (ref 0.1–1.0)
Monocytes Relative: 9 %
Neutro Abs: 5.5 10*3/uL (ref 1.7–7.7)
Neutrophils Relative %: 63 %
Platelets: 16 10*3/uL — CL (ref 150–400)
RBC: 4.63 MIL/uL (ref 3.87–5.11)
RDW: 21.2 % — AB (ref 11.5–15.5)
Smear Review: DECREASED
WBC: 8.7 10*3/uL (ref 4.0–10.5)
nRBC: 0 % (ref 0.0–0.2)

## 2018-12-28 ENCOUNTER — Inpatient Hospital Stay: Payer: Medicare HMO

## 2018-12-28 ENCOUNTER — Other Ambulatory Visit: Payer: Self-pay

## 2018-12-28 DIAGNOSIS — D693 Immune thrombocytopenic purpura: Secondary | ICD-10-CM

## 2018-12-28 MED ORDER — ROMIPLOSTIM INJECTION 500 MCG
500.0000 ug | Freq: Once | SUBCUTANEOUS | Status: AC
  Administered 2018-12-28: 500 ug via SUBCUTANEOUS
  Filled 2018-12-28: qty 1

## 2019-01-09 ENCOUNTER — Other Ambulatory Visit: Payer: Self-pay

## 2019-01-10 ENCOUNTER — Inpatient Hospital Stay: Payer: Medicare HMO

## 2019-01-10 ENCOUNTER — Inpatient Hospital Stay: Payer: Medicare HMO | Admitting: Oncology

## 2019-01-10 NOTE — Progress Notes (Deleted)
[place]  [phone]  [phone]     Ariana [name] DOB: 06/12/1933  MR#: [id number]  QBV#:[id number]  Patient Care Team: Idelle Crouch, MD as PCP - General (Internal Medicine)  CHIEF COMPLAINT: Chronic refractory ITP  INTERVAL HISTORY: Patient returns to clinic today for further evaluation and continuation of Nplate.  She continues to feel well and remains asymptomatic.  She is noticed decreased bruising over the past several weeks and denies any bleeding. She continues to have issues with memory. She has had no recent falls.  She has no neurologic complaints. She denies any fevers. She has no chest pain or shortness of breath. She denies any nausea, vomiting, constipation, or diarrhea.  She has no melena or hematochezia.  She has no urinary complaints.  Patient offers no specific complaints today.    REVIEW OF SYSTEMS:   Review of Systems  Constitutional: Negative.  Negative for fever, malaise/fatigue and weight loss.  HENT: Negative for congestion.   Respiratory: Negative.  Negative for cough, hemoptysis and shortness of breath.   Cardiovascular: Negative.  Negative for chest pain and leg swelling.  Gastrointestinal: Negative.  Negative for abdominal pain, blood in stool, diarrhea, melena and nausea.  Genitourinary: Negative.  Negative for dysuria and hematuria.  Musculoskeletal: Negative.  Negative for back pain and falls.  Skin: Negative.  Negative for rash.  Neurological: Negative.  Negative for sensory change, focal weakness and weakness.  Endo/Heme/Allergies: Negative.  Does not bruise/bleed easily.  Psychiatric/Behavioral: Positive for memory loss. Negative for depression. The patient is not nervous/anxious.     As per HPI. Otherwise, a complete review of systems is negative.  PAST MEDICAL HISTORY: Past Medical History:  Diagnosis Date  . Alzheimer disease ([place])   . Anemia   . Anxiety   . Asthma   . Chronic back pain    . Depression   . Depression   . GERD (gastroesophageal reflux disease)   . Hiatal hernia   . Hypercholesteremia   . ITP (idiopathic thrombocytopenic purpura)   . Osteoarthritis   . Osteoporosis   . Pulmonary emboli ([place])   . Restless legs     PAST SURGICAL HISTORY: Past Surgical History:  Procedure Laterality Date  . CARPAL TUNNEL RELEASE    . ESOPHAGOGASTRODUODENOSCOPY (EGD) WITH PROPOFOL N/A 09/10/2015   Procedure: ESOPHAGOGASTRODUODENOSCOPY (EGD) WITH PROPOFOL;  Surgeon: Lollie Sails, MD;  Location: [facility] ENDOSCOPY;  Service: Endoscopy;  Laterality: N/A;  . IRRIGATION AND DEBRIDEMENT HEMATOMA Right 07/13/2018   Procedure: IRRIGATION AND DEBRIDEMENT HEMATOMA-RIGHT SHIN;  Surgeon: Herbert Pun, MD;  Location: [facility] ORS;  Service: General;  Laterality: Right;  . LUMBAR DISC SURGERY    . SPLENECTOMY, PARTIAL    . TOTAL ABDOMINAL HYSTERECTOMY      FAMILY HISTORY Family History  Problem Relation Age of Onset  . Stroke Mother     GYNECOLOGIC HISTORY:  No LMP recorded. Patient is postmenopausal.     ADVANCED DIRECTIVES:    HEALTH MAINTENANCE: Social History   Tobacco Use  . Smoking status: Never Smoker  . Smokeless tobacco: Never Used  Substance Use Topics  . Alcohol use: No    Alcohol/week: 0.0 standard drinks  . Drug use: No     Allergies  Allergen Reactions  . Aspirin     Other reaction(s): Distress (finding)  . Diazepam     Other reaction(s): Itching of Skin sneezing  . Morphine     Other reaction(s): Itching of Skin  . Other  Other reaction(s): Unknown seasonal allergies  . Tetanus Toxoids     Other reaction(s): Localized superficial swelling of skin    Current Outpatient Medications  Medication Sig Dispense Refill  . acetaminophen (TYLENOL) 325 MG tablet Take 2 tablets (650 mg total) by mouth every 6 (six) hours as needed for mild pain (or Fever >/= 101). 100 tablet 0  . albuterol (PROVENTIL HFA;VENTOLIN HFA) 108 (90 BASE) MCG/ACT  inhaler Inhale 2 puffs into the lungs every 6 (six) hours as needed for wheezing or shortness of breath.    . ALPRAZolam (XANAX) 0.5 MG tablet Take 1 tablet (0.5 mg total) by mouth 3 (three) times daily as needed for anxiety. 20 tablet 0  . buPROPion (WELLBUTRIN XL) 150 MG 24 hr tablet Take 150 mg by mouth daily.    . busPIRone (BUSPAR) 15 MG tablet Take 1 tablet by mouth 1 day or 1 dose.    . Calcium Carbonate-Vitamin D (CALCIUM 600+D) 600-400 MG-UNIT per tablet Take 1 tablet by mouth daily.    . citalopram (CELEXA) 40 MG tablet Take 40 mg by mouth daily.    . Cyanocobalamin (RA VITAMIN B-12 TR) 1000 MCG TBCR Take 1,000 mcg by mouth daily.     Marland Kitchen donepezil (ARICEPT) 5 MG tablet Take 10 mg by mouth at bedtime.     . fluticasone (FLONASE) 50 MCG/ACT nasal spray Place 2 sprays into both nostrils daily.     . Fluticasone-Salmeterol (ADVAIR) 250-50 MCG/DOSE AEPB Inhale 1 puff into the lungs daily as needed.     . gabapentin (NEURONTIN) 300 MG capsule Take 300 mg by mouth 4 (four) times daily.    Marland Kitchen HYDROcodone-acetaminophen (NORCO) 7.5-325 MG tablet Take 1 tablet by mouth every 4 (four) hours as needed for moderate pain or severe pain. 25 tablet 0  . levothyroxine (SYNTHROID, LEVOTHROID) 50 MCG tablet Take 50 mcg by mouth daily before breakfast. Take 30 to 60 minutes before breakfast.    . memantine (NAMENDA) 5 MG tablet Take 5 mg by mouth 2 (two) times daily.     . memantine (NAMENDA) 5 MG tablet Take 1 tablet by mouth 1 day or 1 dose.    . montelukast (SINGULAIR) 10 MG tablet Take 10 mg by mouth at bedtime.     . Multiple Vitamins-Minerals (OCUVITE-LUTEIN PO) Take 1 capsule by mouth daily.     Marland Kitchen NARCAN 4 MG/0.1ML LIQD nasal spray kit CALL 911. ADMINISTER A SINGLE SPRAY OF NARCAN IN ONE NOSTRIL. REPEAT EVERY 3 MINUTES AS NEEDED IF NO OR MINIMAL RESPONSE.    Marland Kitchen nystatin ointment (MYCOSTATIN) APPLY OINTMENT TOPICALLY TWICE DAILY  3  . oxybutynin (DITROPAN) 5 MG tablet Take 5 mg by mouth 3 (three) times  daily.    . pantoprazole (PROTONIX) 20 MG tablet Take 20 mg by mouth 2 (two) times daily.     . pravastatin (PRAVACHOL) 20 MG tablet Take 20 mg by mouth daily.    . predniSONE (DELTASONE) 10 MG tablet Take 10 mg by mouth daily.   1  . SSD 1 % cream Apply topically 2 (two) times daily.  1  . traMADol (ULTRAM) 50 MG tablet Take 50 mg by mouth 2 (two) times daily as needed.  5  . triamcinolone cream (KENALOG) 0.1 % Apply topically.    . vitamin C (ASCORBIC ACID) 500 MG tablet Take 500 mg by mouth daily.    . vitamin E (E-400) 400 UNIT capsule Take 400 Units by mouth daily.     No current  facility-administered medications for this visit.     OBJECTIVE: There were no vitals taken for this visit.   There is no height or weight on file to calculate BMI.    ECOG FS:1 - Symptomatic but completely ambulatory  General: Well-developed, well-nourished, no acute distress. Eyes: Pink conjunctiva, anicteric sclera. HEENT: Normocephalic, moist mucous membranes. Lungs: Clear to auscultation bilaterally. Heart: Regular rate and rhythm. No rubs, murmurs, or gallops. Abdomen: Soft, nontender, nondistended. No organomegaly noted, normoactive bowel sounds. Musculoskeletal: No edema, cyanosis, or clubbing. Neuro: Alert, answering all questions appropriately. Cranial nerves grossly intact. Skin: No rashes or petechiae noted. Psych: Normal affect.  LAB RESULTS:  No visits with results within 3 Day(s) from this visit.  Latest known visit with results is:  Orders Only on 12/27/2018  Component Date Value Ref Range Status  . WBC 12/27/2018 8.7  4.0 - 10.5 K/uL Final  . RBC 12/27/2018 4.63  3.87 - 5.11 MIL/uL Final  . Hemoglobin 12/27/2018 10.4* 12.0 - 15.0 g/dL Final   Comment: Reticulocyte Hemoglobin testing may be clinically indicated, consider ordering this additional test [id number]   . HCT 12/27/2018 35.0* 36.0 - 46.0 % Final  . MCV 12/27/2018 75.6* 80.0 - 100.0 fL Final  . MCH 12/27/2018 22.5* 26.0  - 34.0 pg Final  . MCHC 12/27/2018 29.7* 30.0 - 36.0 g/dL Final  . RDW 12/27/2018 21.2* 11.5 - 15.5 % Final  . Platelets 12/27/2018 16* 150 - 400 K/uL Final   Comment: REPEATED TO VERIFY PLATELET COUNT CONFIRMED BY SMEAR SPECIMEN CHECKED FOR CLOTS Immature Platelet Fraction may be clinically indicated, consider ordering this additional test [id number] CRITICAL RESULT CALLED TO, READ BACK BY AND VERIFIED WITH: [facility] [place] AT 11:54 12/27/2018 KMR   . nRBC 12/27/2018 0.0  0.0 - 0.2 % Final  . Neutrophils Relative % 12/27/2018 63  % Final  . Neutro Abs 12/27/2018 5.5  1.7 - 7.7 K/uL Final  . Lymphocytes Relative 12/27/2018 25  % Final  . Lymphs Abs 12/27/2018 2.2  0.7 - 4.0 K/uL Final  . Monocytes Relative 12/27/2018 9  % Final  . Monocytes Absolute 12/27/2018 0.8  0.1 - 1.0 K/uL Final  . Eosinophils Relative 12/27/2018 2  % Final  . Eosinophils Absolute 12/27/2018 0.2  0.0 - 0.5 K/uL Final  . Basophils Relative 12/27/2018 1  % Final  . Basophils Absolute 12/27/2018 0.0  0.0 - 0.1 K/uL Final  . Smear Review 12/27/2018 PLATELETS APPEAR DECREASED   Final   GIANT PLATELETS NOTED  . Immature Granulocytes 12/27/2018 0  % Final  . Abs Immature Granulocytes 12/27/2018 0.02  0.00 - 0.07 K/uL Final   Performed at [facility], [address] [place], [place] [address]    STUDIES: No results found.  ASSESSMENT:  Chronic refractory ITP  PLAN:   1. Chronic refractory ITP: Patient had a poor response to Prednisone, WinRho, and Rituxan. She is status post splenectomy in 2007.  Patient's platelet count has significantly improved and is now 143.  She does not require 8 mcg/kg Nplate today.  Will continue to treat as long as patient's platelet count remains below 100.  Return to clinic in 2 weeks for laboratory check and then in 4 weeks for further evaluation and continuation of treatment if necessary. If patient becomes refractory to treatment, can consider Promacta 12.26m or Tavalisse 100 mg  BID at a later date if necessary.  Max dose of Nplate is 10 mcg/kg. 2. Weakness and fatigue: Patient does not complain of  this today.   3. Depression/dementia: Chronic and unchanged.  Patient is now at [employer]. 4.  Falls: Patient has no reported falls recently.  I spent a total of 30 minutes face-to-face with the patient of which greater than 50% of the visit was spent in counseling and coordination of care as detailed above.   Patient expressed understanding and was in agreement with this plan. She also understands that She can call clinic at any time with any questions, concerns, or complaints.    Lloyd Huger, MD   01/10/2019 7:13 AM

## 2019-01-11 ENCOUNTER — Other Ambulatory Visit: Payer: Self-pay

## 2019-01-11 DIAGNOSIS — G894 Chronic pain syndrome: Secondary | ICD-10-CM | POA: Insufficient documentation

## 2019-01-12 ENCOUNTER — Inpatient Hospital Stay: Payer: Medicare HMO | Attending: Oncology

## 2019-01-12 ENCOUNTER — Other Ambulatory Visit: Payer: Self-pay

## 2019-01-12 ENCOUNTER — Inpatient Hospital Stay: Payer: Medicare HMO

## 2019-01-12 ENCOUNTER — Encounter: Payer: Self-pay | Admitting: Oncology

## 2019-01-12 ENCOUNTER — Inpatient Hospital Stay (HOSPITAL_BASED_OUTPATIENT_CLINIC_OR_DEPARTMENT_OTHER): Payer: Medicare HMO | Admitting: Oncology

## 2019-01-12 VITALS — BP 136/72 | HR 78 | Temp 97.8°F | Wt 135.0 lb

## 2019-01-12 DIAGNOSIS — D649 Anemia, unspecified: Secondary | ICD-10-CM | POA: Insufficient documentation

## 2019-01-12 DIAGNOSIS — R531 Weakness: Secondary | ICD-10-CM | POA: Diagnosis not present

## 2019-01-12 DIAGNOSIS — F329 Major depressive disorder, single episode, unspecified: Secondary | ICD-10-CM | POA: Insufficient documentation

## 2019-01-12 DIAGNOSIS — F419 Anxiety disorder, unspecified: Secondary | ICD-10-CM | POA: Insufficient documentation

## 2019-01-12 DIAGNOSIS — R5383 Other fatigue: Secondary | ICD-10-CM | POA: Diagnosis not present

## 2019-01-12 DIAGNOSIS — F0390 Unspecified dementia without behavioral disturbance: Secondary | ICD-10-CM

## 2019-01-12 DIAGNOSIS — Z7951 Long term (current) use of inhaled steroids: Secondary | ICD-10-CM | POA: Insufficient documentation

## 2019-01-12 DIAGNOSIS — Z9081 Acquired absence of spleen: Secondary | ICD-10-CM | POA: Insufficient documentation

## 2019-01-12 DIAGNOSIS — D693 Immune thrombocytopenic purpura: Secondary | ICD-10-CM | POA: Diagnosis present

## 2019-01-12 DIAGNOSIS — Z79899 Other long term (current) drug therapy: Secondary | ICD-10-CM | POA: Diagnosis not present

## 2019-01-12 DIAGNOSIS — R296 Repeated falls: Secondary | ICD-10-CM

## 2019-01-12 DIAGNOSIS — E7800 Pure hypercholesterolemia, unspecified: Secondary | ICD-10-CM | POA: Insufficient documentation

## 2019-01-12 DIAGNOSIS — Z86711 Personal history of pulmonary embolism: Secondary | ICD-10-CM | POA: Diagnosis not present

## 2019-01-12 LAB — CBC WITH DIFFERENTIAL/PLATELET
Abs Immature Granulocytes: 0.05 10*3/uL (ref 0.00–0.07)
Basophils Absolute: 0.1 10*3/uL (ref 0.0–0.1)
Basophils Relative: 1 %
Eosinophils Absolute: 0.2 10*3/uL (ref 0.0–0.5)
Eosinophils Relative: 2 %
HCT: 32.8 % — ABNORMAL LOW (ref 36.0–46.0)
Hemoglobin: 9.7 g/dL — ABNORMAL LOW (ref 12.0–15.0)
Immature Granulocytes: 1 %
Lymphocytes Relative: 27 %
Lymphs Abs: 2.6 10*3/uL (ref 0.7–4.0)
MCH: 21.8 pg — ABNORMAL LOW (ref 26.0–34.0)
MCHC: 29.6 g/dL — ABNORMAL LOW (ref 30.0–36.0)
MCV: 73.7 fL — ABNORMAL LOW (ref 80.0–100.0)
Monocytes Absolute: 0.9 10*3/uL (ref 0.1–1.0)
Monocytes Relative: 9 %
Neutro Abs: 5.7 10*3/uL (ref 1.7–7.7)
Neutrophils Relative %: 60 %
Platelets: 300 10*3/uL (ref 150–400)
RBC: 4.45 MIL/uL (ref 3.87–5.11)
RDW: 21.6 % — ABNORMAL HIGH (ref 11.5–15.5)
WBC: 9.5 10*3/uL (ref 4.0–10.5)
nRBC: 0.2 % (ref 0.0–0.2)

## 2019-01-12 NOTE — Progress Notes (Signed)
[place]  [phone]  Fax:(336) [phone]     Ariana [name] DOB: [birth year]  MR#: [id number]  IEP#:[id number]  Patient Care Team: Idelle Crouch, MD as PCP - General (Internal Medicine)  CHIEF COMPLAINT: Chronic refractory ITP  INTERVAL HISTORY: Patient returns to clinic today for further evaluation and continuation of Nplate.  She continues to feel well and remains asymptomatic.  She continues to have occasional bruising, but not as prominent as previously.  She continues to have issues with memory. She has had no recent falls.  She has no neurologic complaints.  She denies any recent fevers or illnesses.  She denies any chest pain, shortness of breath, cough, or hemoptysis.  She denies any nausea, vomiting, constipation, or diarrhea.  She has no melena or hematochezia.  She has no urinary complaints.  Patient feels at her baseline offers no specific complaints today.  REVIEW OF SYSTEMS:   Review of Systems  Constitutional: Negative.  Negative for fever, malaise/fatigue and weight loss.  HENT: Negative for congestion.   Respiratory: Negative.  Negative for cough, hemoptysis and shortness of breath.   Cardiovascular: Negative.  Negative for chest pain and leg swelling.  Gastrointestinal: Negative.  Negative for abdominal pain, blood in stool, diarrhea, melena and nausea.  Genitourinary: Negative.  Negative for dysuria and hematuria.  Musculoskeletal: Negative.  Negative for back pain and falls.  Skin: Negative.  Negative for rash.  Neurological: Negative.  Negative for sensory change, focal weakness and weakness.  Endo/Heme/Allergies: Negative.  Does not bruise/bleed easily.  Psychiatric/Behavioral: Positive for memory loss. Negative for depression. The patient is not nervous/anxious.     As per HPI. Otherwise, a complete review of systems is negative.  PAST MEDICAL HISTORY: Past Medical History:  Diagnosis Date  . Alzheimer disease ([place])    . Anemia   . Anxiety   . Asthma   . Chronic back pain   . Depression   . Depression   . GERD (gastroesophageal reflux disease)   . Hiatal hernia   . Hypercholesteremia   . ITP (idiopathic thrombocytopenic purpura)   . Osteoarthritis   . Osteoporosis   . Pulmonary emboli ([place])   . Restless legs     PAST SURGICAL HISTORY: Past Surgical History:  Procedure Laterality Date  . CARPAL TUNNEL RELEASE    . ESOPHAGOGASTRODUODENOSCOPY (EGD) WITH PROPOFOL N/A 09/10/2015   Procedure: ESOPHAGOGASTRODUODENOSCOPY (EGD) WITH PROPOFOL;  Surgeon: Lollie Sails, MD;  Location: [facility] ENDOSCOPY;  Service: Endoscopy;  Laterality: N/A;  . IRRIGATION AND DEBRIDEMENT HEMATOMA Right 07/13/2018   Procedure: IRRIGATION AND DEBRIDEMENT HEMATOMA-RIGHT SHIN;  Surgeon: Herbert Pun, MD;  Location: [facility] ORS;  Service: General;  Laterality: Right;  . LUMBAR DISC SURGERY    . SPLENECTOMY, PARTIAL    . TOTAL ABDOMINAL HYSTERECTOMY      FAMILY HISTORY Family History  Problem Relation Age of Onset  . Stroke Mother     GYNECOLOGIC HISTORY:  No LMP recorded. Patient is postmenopausal.     ADVANCED DIRECTIVES:    HEALTH MAINTENANCE: Social History   Tobacco Use  . Smoking status: Never Smoker  . Smokeless tobacco: Never Used  Substance Use Topics  . Alcohol use: No    Alcohol/week: 0.0 standard drinks  . Drug use: No     Allergies  Allergen Reactions  . Aspirin     Other reaction(s): Distress (finding)  . Diazepam     Other reaction(s): Itching of Skin sneezing  . Morphine  Other reaction(s): Itching of Skin  . Other     Other reaction(s): Unknown seasonal allergies  . Tetanus Toxoids     Other reaction(s): Localized superficial swelling of skin    Current Outpatient Medications  Medication Sig Dispense Refill  . acetaminophen (TYLENOL) 325 MG tablet Take 2 tablets (650 mg total) by mouth every 6 (six) hours as needed for mild pain (or Fever >/= 101). 100 tablet 0  .  albuterol (PROVENTIL HFA;VENTOLIN HFA) 108 (90 BASE) MCG/ACT inhaler Inhale 2 puffs into the lungs every 6 (six) hours as needed for wheezing or shortness of breath.    . ALPRAZolam (XANAX) 0.5 MG tablet Take 1 tablet (0.5 mg total) by mouth 3 (three) times daily as needed for anxiety. 20 tablet 0  . buPROPion (WELLBUTRIN XL) 150 MG 24 hr tablet Take 150 mg by mouth daily.    . busPIRone (BUSPAR) 15 MG tablet Take 1 tablet by mouth 1 day or 1 dose.    . Calcium Carbonate-Vitamin D (CALCIUM 600+D) 600-400 MG-UNIT per tablet Take 1 tablet by mouth daily.    . ciclopirox (PENLAC) 8 % solution Apply topically.    . citalopram (CELEXA) 40 MG tablet Take 40 mg by mouth daily.    . Cyanocobalamin (RA VITAMIN B-12 TR) 1000 MCG TBCR Take 1,000 mcg by mouth daily.     Marland Kitchen donepezil (ARICEPT) 5 MG tablet Take 10 mg by mouth at bedtime.     . fluticasone (FLONASE) 50 MCG/ACT nasal spray Place 2 sprays into both nostrils daily.     . Fluticasone-Salmeterol (ADVAIR) 250-50 MCG/DOSE AEPB Inhale 1 puff into the lungs daily as needed.     . gabapentin (NEURONTIN) 300 MG capsule Take 300 mg by mouth 4 (four) times daily.    Marland Kitchen HYDROcodone-acetaminophen (NORCO) 7.5-325 MG tablet Take 1 tablet by mouth every 4 (four) hours as needed for moderate pain or severe pain. 25 tablet 0  . levothyroxine (SYNTHROID, LEVOTHROID) 50 MCG tablet Take 50 mcg by mouth daily before breakfast. Take 30 to 60 minutes before breakfast.    . memantine (NAMENDA) 5 MG tablet Take 1 tablet by mouth 1 day or 1 dose.    . montelukast (SINGULAIR) 10 MG tablet Take 10 mg by mouth at bedtime.     . Multiple Vitamins-Minerals (OCUVITE-LUTEIN PO) Take 1 capsule by mouth daily.     Marland Kitchen NARCAN 4 MG/0.1ML LIQD nasal spray kit CALL 911. ADMINISTER A SINGLE SPRAY OF NARCAN IN ONE NOSTRIL. REPEAT EVERY 3 MINUTES AS NEEDED IF NO OR MINIMAL RESPONSE.    Marland Kitchen nystatin ointment (MYCOSTATIN) APPLY OINTMENT TOPICALLY TWICE DAILY  3  . oxybutynin (DITROPAN) 5 MG tablet  Take 5 mg by mouth 3 (three) times daily.    . pantoprazole (PROTONIX) 20 MG tablet Take 20 mg by mouth 2 (two) times daily.     . pravastatin (PRAVACHOL) 20 MG tablet Take 20 mg by mouth daily.    . predniSONE (DELTASONE) 10 MG tablet Take 10 mg by mouth daily.   1  . ranitidine (ZANTAC) 300 MG tablet Take 1 tablet by mouth at bedtime.    . SSD 1 % cream Apply topically 2 (two) times daily.  1  . traMADol (ULTRAM) 50 MG tablet Take 50 mg by mouth 2 (two) times daily as needed.  5  . triamcinolone cream (KENALOG) 0.1 % Apply topically.    . vitamin C (ASCORBIC ACID) 500 MG tablet Take 500 mg by mouth daily.    Marland Kitchen  vitamin E (E-400) 400 UNIT capsule Take 400 Units by mouth daily.    . memantine (NAMENDA) 5 MG tablet Take 5 mg by mouth 2 (two) times daily.      No current facility-administered medications for this visit.     OBJECTIVE: BP 136/72 (BP Location: Left Arm, Patient Position: Sitting)   Pulse 78   Temp 97.8 F (36.6 C) (Tympanic)   Wt 135 lb (61.2 kg)   BMI 22.12 kg/m    Body mass index is 22.12 kg/m.    ECOG FS:1 - Symptomatic but completely ambulatory  General: Well-developed, well-nourished, no acute distress. Eyes: Pink conjunctiva, anicteric sclera. HEENT: Normocephalic, moist mucous membranes. Lungs: Clear to auscultation bilaterally. Heart: Regular rate and rhythm. No rubs, murmurs, or gallops. Abdomen: Soft, nontender, nondistended. No organomegaly noted, normoactive bowel sounds. Musculoskeletal: No edema, cyanosis, or clubbing. Neuro: Alert, answering all questions appropriately. Cranial nerves grossly intact. Skin: No rashes or petechiae noted. Psych: Normal affect.  LAB RESULTS:  Appointment on 01/12/2019  Component Date Value Ref Range Status  . WBC 01/12/2019 9.5  4.0 - 10.5 K/uL Final   WHITE COUNT CONFIRMED ON SMEAR  . RBC 01/12/2019 4.45  3.87 - 5.11 MIL/uL Final  . Hemoglobin 01/12/2019 9.7* 12.0 - 15.0 g/dL Final   Comment: Reticulocyte Hemoglobin  testing may be clinically indicated, consider ordering this additional test [id number]   . HCT 01/12/2019 32.8* 36.0 - 46.0 % Final  . MCV 01/12/2019 73.7* 80.0 - 100.0 fL Final  . MCH 01/12/2019 21.8* 26.0 - 34.0 pg Final  . MCHC 01/12/2019 29.6* 30.0 - 36.0 g/dL Final  . RDW 01/12/2019 21.6* 11.5 - 15.5 % Final  . Platelets 01/12/2019 300  150 - 400 K/uL Final   Comment: REPEATED TO VERIFY PLATELET COUNT CONFIRMED BY SMEAR PLATELETS APPEAR ADEQUATE   . nRBC 01/12/2019 0.2  0.0 - 0.2 % Final  . Neutrophils Relative % 01/12/2019 60  % Final  . Neutro Abs 01/12/2019 5.7  1.7 - 7.7 K/uL Final  . Lymphocytes Relative 01/12/2019 27  % Final  . Lymphs Abs 01/12/2019 2.6  0.7 - 4.0 K/uL Final  . Monocytes Relative 01/12/2019 9  % Final  . Monocytes Absolute 01/12/2019 0.9  0.1 - 1.0 K/uL Final  . Eosinophils Relative 01/12/2019 2  % Final  . Eosinophils Absolute 01/12/2019 0.2  0.0 - 0.5 K/uL Final  . Basophils Relative 01/12/2019 1  % Final  . Basophils Absolute 01/12/2019 0.1  0.0 - 0.1 K/uL Final  . WBC Morphology 01/12/2019 DIFF CONFIRMED BY MANUAL   Final  . RBC Morphology 01/12/2019 MIXED RBC POPULATION   Final  . Smear Review 01/12/2019 MANY GIANT PLATELETS NOTED   Final  . Immature Granulocytes 01/12/2019 1  % Final  . Abs Immature Granulocytes 01/12/2019 0.05  0.00 - 0.07 K/uL Final  . Smudge Cells 01/12/2019 PRESENT   Final  . Schistocytes 01/12/2019 PRESENT   Final  . Tear Drop Cells 01/12/2019 PRESENT   Final  . Target Cells 01/12/2019 PRESENT   Final  . Giant PLTs 01/12/2019 PRESENT   Final   Performed at [facility], [address] [place], [place] [address]    STUDIES: No results found.  ASSESSMENT:  Chronic refractory ITP  PLAN:   1. Chronic refractory ITP: Patient had a poor response to Prednisone, WinRho, and Rituxan. She is status post splenectomy in 2007.  Patient platelet count is significantly improved and is 300 today.  She last received 8  mcg/kg  Nplate on December 28, 9526.  She does not require treatment today.  Return to clinic in 4 weeks for further evaluation and continuation of treatment if necessary. If patient becomes refractory to treatment, can consider Promacta 12.'5mg'$  or Tavalisse 100 mg BID at a later date if necessary.  Max dose of Nplate is 10 mcg/kg. 2. Weakness and fatigue: Patient does not complain of this today.   3. Depression/dementia: Chronic and unchanged.  Patient is now at [employer]. 4.  Falls: Patient has no reported falls recently. 5.  Anemia: Patient's hemoglobin is decreased to 9.7, but relatively stable.  Monitor.  I spent a total of 30 minutes face-to-face with the patient of which greater than 50% of the visit was spent in counseling and coordination of care as detailed above.   Patient expressed understanding and was in agreement with this plan. She also understands that She can call clinic at any time with any questions, concerns, or complaints.    Lloyd Huger, MD   01/14/2019 7:22 AM

## 2019-01-12 NOTE — Progress Notes (Signed)
Patient stated that she had been doing better with no complaints.

## 2019-01-31 ENCOUNTER — Telehealth: Payer: Self-pay | Admitting: *Deleted

## 2019-01-31 NOTE — Telephone Encounter (Signed)
Ariana [name] can have a lab visit this week to check and NPlate if needed.

## 2019-01-31 NOTE — Telephone Encounter (Signed)
Patients husband, Kyung Rudd, called stating that pt's platelets are low again and asking if she should return to the clinic sooner than scheduled appt of 02/09/19

## 2019-02-01 NOTE — Telephone Encounter (Signed)
Scheduling message sent. 

## 2019-02-02 ENCOUNTER — Other Ambulatory Visit: Payer: Self-pay

## 2019-02-03 ENCOUNTER — Inpatient Hospital Stay: Payer: Medicare HMO

## 2019-02-03 ENCOUNTER — Inpatient Hospital Stay (HOSPITAL_BASED_OUTPATIENT_CLINIC_OR_DEPARTMENT_OTHER): Payer: Medicare HMO | Admitting: Oncology

## 2019-02-03 ENCOUNTER — Other Ambulatory Visit: Payer: Self-pay

## 2019-02-03 ENCOUNTER — Encounter: Payer: Self-pay | Admitting: Oncology

## 2019-02-03 VITALS — Wt 138.0 lb

## 2019-02-03 DIAGNOSIS — F329 Major depressive disorder, single episode, unspecified: Secondary | ICD-10-CM | POA: Diagnosis not present

## 2019-02-03 DIAGNOSIS — Z9081 Acquired absence of spleen: Secondary | ICD-10-CM

## 2019-02-03 DIAGNOSIS — Z86711 Personal history of pulmonary embolism: Secondary | ICD-10-CM

## 2019-02-03 DIAGNOSIS — Z79899 Other long term (current) drug therapy: Secondary | ICD-10-CM

## 2019-02-03 DIAGNOSIS — D693 Immune thrombocytopenic purpura: Secondary | ICD-10-CM | POA: Diagnosis not present

## 2019-02-03 DIAGNOSIS — E7800 Pure hypercholesterolemia, unspecified: Secondary | ICD-10-CM

## 2019-02-03 DIAGNOSIS — F419 Anxiety disorder, unspecified: Secondary | ICD-10-CM

## 2019-02-03 DIAGNOSIS — D649 Anemia, unspecified: Secondary | ICD-10-CM | POA: Diagnosis not present

## 2019-02-03 DIAGNOSIS — Z7951 Long term (current) use of inhaled steroids: Secondary | ICD-10-CM

## 2019-02-03 LAB — CBC WITH DIFFERENTIAL/PLATELET
Abs Immature Granulocytes: 0.04 10*3/uL (ref 0.00–0.07)
Basophils Absolute: 0.1 10*3/uL (ref 0.0–0.1)
Basophils Relative: 1 %
Eosinophils Absolute: 0.3 10*3/uL (ref 0.0–0.5)
Eosinophils Relative: 3 %
HCT: 32.2 % — ABNORMAL LOW (ref 36.0–46.0)
Hemoglobin: 9.7 g/dL — ABNORMAL LOW (ref 12.0–15.0)
Immature Granulocytes: 0 %
Lymphocytes Relative: 29 %
Lymphs Abs: 2.7 10*3/uL (ref 0.7–4.0)
MCH: 23 pg — ABNORMAL LOW (ref 26.0–34.0)
MCHC: 30.1 g/dL (ref 30.0–36.0)
MCV: 76.3 fL — ABNORMAL LOW (ref 80.0–100.0)
Monocytes Absolute: 0.9 10*3/uL (ref 0.1–1.0)
Monocytes Relative: 9 %
Neutro Abs: 5.3 10*3/uL (ref 1.7–7.7)
Neutrophils Relative %: 58 %
Platelets: 20 10*3/uL — CL (ref 150–400)
RBC: 4.22 MIL/uL (ref 3.87–5.11)
RDW: 22.8 % — ABNORMAL HIGH (ref 11.5–15.5)
WBC: 9.2 10*3/uL (ref 4.0–10.5)
nRBC: 0 % (ref 0.0–0.2)

## 2019-02-03 MED ORDER — ROMIPLOSTIM INJECTION 500 MCG
9.0000 ug/kg | Freq: Once | SUBCUTANEOUS | Status: AC
  Administered 2019-02-03: 565 ug via SUBCUTANEOUS
  Filled 2019-02-03: qty 0.5

## 2019-02-03 NOTE — Progress Notes (Signed)
[place]  [phone]) [phone]  [phone]     Ariana [name] DOB: 05-Jul-1933  MR#: [id number]  LYY#:[id number]  Patient Care Team: Idelle Crouch, MD as PCP - General (Internal Medicine)  CHIEF COMPLAINT: Chronic refractory ITP  INTERVAL HISTORY: Patient returns to clinic today as an add-on after her husband noted increased bruising as well as a ground-level fall last week.  Patient did not seek medical attention after her fall and denies any head trauma.  Her pelvis is slightly sore and she has increased bruising, but otherwise feels well.  She has no neurologic complaints.  She denies any recent fevers or illnesses.  She denies any chest pain, shortness of breath, cough, or hemoptysis.  She denies any nausea, vomiting, constipation, or diarrhea.  She has no melena or hematochezia.  She has no urinary complaints. Patient offers no further specific complaints today.  REVIEW OF SYSTEMS:   Review of Systems  Constitutional: Negative.  Negative for fever, malaise/fatigue and weight loss.  HENT: Negative for congestion.   Respiratory: Negative.  Negative for cough, hemoptysis and shortness of breath.   Cardiovascular: Negative.  Negative for chest pain and leg swelling.  Gastrointestinal: Negative.  Negative for abdominal pain, blood in stool, diarrhea, melena and nausea.  Genitourinary: Negative.  Negative for dysuria and hematuria.  Musculoskeletal: Negative.  Negative for back pain and falls.  Skin: Negative.  Negative for rash.  Neurological: Negative.  Negative for sensory change, focal weakness and weakness.  Endo/Heme/Allergies: Bruises/bleeds easily.  Psychiatric/Behavioral: Positive for memory loss. Negative for depression. The patient is not nervous/anxious.     As per HPI. Otherwise, a complete review of systems is negative.  PAST MEDICAL HISTORY: Past Medical History:  Diagnosis Date  . Alzheimer disease ([place])   . Anemia   .  Anxiety   . Asthma   . Chronic back pain   . Depression   . Depression   . GERD (gastroesophageal reflux disease)   . Hiatal hernia   . Hypercholesteremia   . ITP (idiopathic thrombocytopenic purpura)   . Osteoarthritis   . Osteoporosis   . Pulmonary emboli ([place])   . Restless legs     PAST SURGICAL HISTORY: Past Surgical History:  Procedure Laterality Date  . CARPAL TUNNEL RELEASE    . ESOPHAGOGASTRODUODENOSCOPY (EGD) WITH PROPOFOL N/A 09/10/2015   Procedure: ESOPHAGOGASTRODUODENOSCOPY (EGD) WITH PROPOFOL;  Surgeon: Lollie Sails, MD;  Location: [facility] ENDOSCOPY;  Service: Endoscopy;  Laterality: N/A;  . IRRIGATION AND DEBRIDEMENT HEMATOMA Right 07/13/2018   Procedure: IRRIGATION AND DEBRIDEMENT HEMATOMA-RIGHT SHIN;  Surgeon: Herbert Pun, MD;  Location: [facility] ORS;  Service: General;  Laterality: Right;  . LUMBAR DISC SURGERY    . SPLENECTOMY, PARTIAL    . TOTAL ABDOMINAL HYSTERECTOMY      FAMILY HISTORY Family History  Problem Relation Age of Onset  . Stroke Mother     GYNECOLOGIC HISTORY:  No LMP recorded. Patient is postmenopausal.     ADVANCED DIRECTIVES:    HEALTH MAINTENANCE: Social History   Tobacco Use  . Smoking status: Never Smoker  . Smokeless tobacco: Never Used  Substance Use Topics  . Alcohol use: No    Alcohol/week: 0.0 standard drinks  . Drug use: No     Allergies  Allergen Reactions  . Aspirin     Other reaction(s): Distress (finding)  . Diazepam     Other reaction(s): Itching of Skin sneezing  . Morphine     Other reaction(s): Itching of  Skin  . Other     Other reaction(s): Unknown seasonal allergies  . Tetanus Toxoids     Other reaction(s): Localized superficial swelling of skin    Current Outpatient Medications  Medication Sig Dispense Refill  . acetaminophen (TYLENOL) 325 MG tablet Take 2 tablets (650 mg total) by mouth every 6 (six) hours as needed for mild pain (or Fever >/= 101). 100 tablet 0  . albuterol  (PROVENTIL HFA;VENTOLIN HFA) 108 (90 BASE) MCG/ACT inhaler Inhale 2 puffs into the lungs every 6 (six) hours as needed for wheezing or shortness of breath.    . ALPRAZolam (XANAX) 0.5 MG tablet Take 1 tablet (0.5 mg total) by mouth 3 (three) times daily as needed for anxiety. 20 tablet 0  . buPROPion (WELLBUTRIN XL) 150 MG 24 hr tablet Take 150 mg by mouth daily.    . busPIRone (BUSPAR) 15 MG tablet Take 1 tablet by mouth 1 day or 1 dose.    . Calcium Carbonate-Vitamin D (CALCIUM 600+D) 600-400 MG-UNIT per tablet Take 1 tablet by mouth daily.    . ciclopirox (PENLAC) 8 % solution Apply topically.    . citalopram (CELEXA) 40 MG tablet Take 40 mg by mouth daily.    . Cyanocobalamin (RA VITAMIN B-12 TR) 1000 MCG TBCR Take 1,000 mcg by mouth daily.     Marland Kitchen donepezil (ARICEPT) 5 MG tablet Take 10 mg by mouth at bedtime.     . fluticasone (FLONASE) 50 MCG/ACT nasal spray Place 2 sprays into both nostrils daily.     . Fluticasone-Salmeterol (ADVAIR) 250-50 MCG/DOSE AEPB Inhale 1 puff into the lungs daily as needed.     . gabapentin (NEURONTIN) 300 MG capsule Take 300 mg by mouth 4 (four) times daily.    Marland Kitchen HYDROcodone-acetaminophen (NORCO) 7.5-325 MG tablet Take 1 tablet by mouth every 4 (four) hours as needed for moderate pain or severe pain. 25 tablet 0  . levothyroxine (SYNTHROID, LEVOTHROID) 50 MCG tablet Take 50 mcg by mouth daily before breakfast. Take 30 to 60 minutes before breakfast.    . memantine (NAMENDA) 5 MG tablet Take 5 mg by mouth 2 (two) times daily.     . memantine (NAMENDA) 5 MG tablet Take 1 tablet by mouth 1 day or 1 dose.    . montelukast (SINGULAIR) 10 MG tablet Take 10 mg by mouth at bedtime.     . Multiple Vitamins-Minerals (OCUVITE-LUTEIN PO) Take 1 capsule by mouth daily.     Marland Kitchen NARCAN 4 MG/0.1ML LIQD nasal spray kit CALL 911. ADMINISTER A SINGLE SPRAY OF NARCAN IN ONE NOSTRIL. REPEAT EVERY 3 MINUTES AS NEEDED IF NO OR MINIMAL RESPONSE.    Marland Kitchen nystatin ointment (MYCOSTATIN) APPLY  OINTMENT TOPICALLY TWICE DAILY  3  . oxybutynin (DITROPAN) 5 MG tablet Take 5 mg by mouth 3 (three) times daily.    . pantoprazole (PROTONIX) 20 MG tablet Take 20 mg by mouth 2 (two) times daily.     . pravastatin (PRAVACHOL) 20 MG tablet Take 20 mg by mouth daily.    . predniSONE (DELTASONE) 10 MG tablet Take 10 mg by mouth daily.   1  . ranitidine (ZANTAC) 300 MG tablet Take 1 tablet by mouth at bedtime.    . SSD 1 % cream Apply topically 2 (two) times daily.  1  . traMADol (ULTRAM) 50 MG tablet Take 50 mg by mouth 2 (two) times daily as needed.  5  . triamcinolone cream (KENALOG) 0.1 % Apply topically.    Marland Kitchen  vitamin C (ASCORBIC ACID) 500 MG tablet Take 500 mg by mouth daily.    . vitamin E (E-400) 400 UNIT capsule Take 400 Units by mouth daily.     No current facility-administered medications for this visit.     OBJECTIVE: Wt 138 lb (62.6 kg)   BMI 22.62 kg/m    Body mass index is 22.62 kg/m.    ECOG FS:1 - Symptomatic but completely ambulatory  General: Well-developed, well-nourished, no acute distress. Eyes: Pink conjunctiva, anicteric sclera. HEENT: Normocephalic, moist mucous membranes. Lungs: Clear to auscultation bilaterally. Heart: Regular rate and rhythm. No rubs, murmurs, or gallops. Abdomen: Soft, nontender, nondistended. No organomegaly noted, normoactive bowel sounds. Musculoskeletal: No edema, cyanosis, or clubbing. Neuro: Alert, answering all questions appropriately. Cranial nerves grossly intact. Skin: No rashes or petechiae noted. Psych: Normal affect.  LAB RESULTS:  Appointment on 02/03/2019  Component Date Value Ref Range Status  . WBC 02/03/2019 9.2  4.0 - 10.5 K/uL Final  . RBC 02/03/2019 4.22  3.87 - 5.11 MIL/uL Final  . Hemoglobin 02/03/2019 9.7* 12.0 - 15.0 g/dL Final   Comment: Reticulocyte Hemoglobin testing may be clinically indicated, consider ordering this additional test [id number]   . HCT 02/03/2019 32.2* 36.0 - 46.0 % Final  . MCV 02/03/2019  76.3* 80.0 - 100.0 fL Final  . MCH 02/03/2019 23.0* 26.0 - 34.0 pg Final  . MCHC 02/03/2019 30.1  30.0 - 36.0 g/dL Final  . RDW 02/03/2019 22.8* 11.5 - 15.5 % Final  . Platelets 02/03/2019 20* 150 - 400 K/uL Final   Comment: Immature Platelet Fraction may be clinically indicated, consider ordering this additional test [id number] THIS CRITICAL RESULT HAS VERIFIED AND BEEN CALLED TO DR.  BY LONNIE RHONE ON 04 24 2020 AT 1108, AND HAS BEEN READ BACK.    Marland Kitchen nRBC 02/03/2019 0.0  0.0 - 0.2 % Final  . Neutrophils Relative % 02/03/2019 58  % Final  . Neutro Abs 02/03/2019 5.3  1.7 - 7.7 K/uL Final  . Lymphocytes Relative 02/03/2019 29  % Final  . Lymphs Abs 02/03/2019 2.7  0.7 - 4.0 K/uL Final  . Monocytes Relative 02/03/2019 9  % Final  . Monocytes Absolute 02/03/2019 0.9  0.1 - 1.0 K/uL Final  . Eosinophils Relative 02/03/2019 3  % Final  . Eosinophils Absolute 02/03/2019 0.3  0.0 - 0.5 K/uL Final  . Basophils Relative 02/03/2019 1  % Final  . Basophils Absolute 02/03/2019 0.1  0.0 - 0.1 K/uL Final  . Immature Granulocytes 02/03/2019 0  % Final  . Abs Immature Granulocytes 02/03/2019 0.04  0.00 - 0.07 K/uL Final   Performed at [facility], [address] [place], [place] [address]    STUDIES: No results found.  ASSESSMENT:  Chronic refractory ITP  PLAN:   1. Chronic refractory ITP: Patient had a poor response to Prednisone, WinRho, and Rituxan. She is status post splenectomy in 2007.  Patient's platelet count is significantly reduced and is 30 today.  She last received 8 mcg/kg Nplate on December 28, 8919.  Will increase dose to 9 mcg/kg today.  Return to clinic in 4 weeks for further evaluation and continuation of treatment if necessary. If patient becomes refractory to treatment, can consider Promacta 12.30m or Tavalisse 100 mg BID at a later date if necessary.  Max dose of Nplate is 10 mcg/kg. 2. Weakness and fatigue: Chronic and unchanged. 3. Depression/dementia: Chronic  and unchanged.  Patient is now at [employer].  Fall: Patient did  not seek medical care at that time. Her husband denies any head trauma. 5.  Anemia: Patient's hemoglobin is stable at 9.7.  Monitor.  I spent a total of 30 minutes face-to-face with the patient of which greater than 50% of the visit was spent in counseling and coordination of care as detailed above.   Patient expressed understanding and was in agreement with this plan. She also understands that She can call clinic at any time with any questions, concerns, or complaints.    Lloyd Huger, MD   02/03/2019 1:33 PM

## 2019-02-03 NOTE — Progress Notes (Signed)
Patient stated that she fell about two weeks ago but not so sure. Patient stated that she has had several bd days for the past two weeks. Patient stated that she was cleaning her refrigerator and all of a sudden she lost her balance and fell backwards. Patient stated that she hurt her right side of her neck, shoulder, hip and back.

## 2019-02-09 ENCOUNTER — Inpatient Hospital Stay: Payer: Medicare HMO | Admitting: Oncology

## 2019-02-09 ENCOUNTER — Inpatient Hospital Stay: Payer: Medicare HMO

## 2019-03-06 NOTE — Progress Notes (Signed)
[place]  Telephone:(336) [phone]  Fax:(336) [phone]     Ariana [name] DOB: [birth year]  MR#: [id number]  WLS#:[id number]  Patient Care Team: Idelle Crouch, MD as PCP - General (Internal Medicine)  CHIEF COMPLAINT: Chronic refractory ITP  INTERVAL HISTORY: Patient returns to clinic today for repeat laboratory work, further evaluation, and continuation of Nplate.  She has noticed increased bruising over the past several weeks including below her left eye.  She denies any falls.  She continues to have increased confusion and is a poor historian.  She has no neurologic complaints.  She denies any recent fevers or illnesses.  She denies any chest pain, shortness of breath, cough, or hemoptysis.  She denies any nausea, vomiting, constipation, or diarrhea.  She has no melena or hematochezia.  She has no urinary complaints.  Patient offers no further specific complaints today.  REVIEW OF SYSTEMS:   Review of Systems  Constitutional: Negative.  Negative for fever, malaise/fatigue and weight loss.  HENT: Negative for congestion.   Respiratory: Negative.  Negative for cough, hemoptysis and shortness of breath.   Cardiovascular: Negative.  Negative for chest pain and leg swelling.  Gastrointestinal: Negative.  Negative for abdominal pain, blood in stool, diarrhea, melena and nausea.  Genitourinary: Negative.  Negative for dysuria and hematuria.  Musculoskeletal: Negative.  Negative for back pain and falls.  Skin: Negative.  Negative for rash.  Neurological: Negative.  Negative for sensory change, focal weakness and weakness.  Endo/Heme/Allergies: Bruises/bleeds easily.  Psychiatric/Behavioral: Positive for memory loss. Negative for depression. The patient is not nervous/anxious.     As per HPI. Otherwise, a complete review of systems is negative.  PAST MEDICAL HISTORY: Past Medical History:  Diagnosis Date  . Alzheimer disease ([place])   . Anemia   . Anxiety    . Asthma   . Chronic back pain   . Depression   . Depression   . GERD (gastroesophageal reflux disease)   . Hiatal hernia   . Hypercholesteremia   . ITP (idiopathic thrombocytopenic purpura)   . Osteoarthritis   . Osteoporosis   . Pulmonary emboli ([place])   . Restless legs     PAST SURGICAL HISTORY: Past Surgical History:  Procedure Laterality Date  . CARPAL TUNNEL RELEASE    . ESOPHAGOGASTRODUODENOSCOPY (EGD) WITH PROPOFOL N/A 09/10/2015   Procedure: ESOPHAGOGASTRODUODENOSCOPY (EGD) WITH PROPOFOL;  Surgeon: Lollie Sails, MD;  Location: [facility] ENDOSCOPY;  Service: Endoscopy;  Laterality: N/A;  . IRRIGATION AND DEBRIDEMENT HEMATOMA Right 07/13/2018   Procedure: IRRIGATION AND DEBRIDEMENT HEMATOMA-RIGHT SHIN;  Surgeon: Herbert Pun, MD;  Location: [facility] ORS;  Service: General;  Laterality: Right;  . LUMBAR DISC SURGERY    . SPLENECTOMY, PARTIAL    . TOTAL ABDOMINAL HYSTERECTOMY      FAMILY HISTORY Family History  Problem Relation Age of Onset  . Stroke Mother     GYNECOLOGIC HISTORY:  No LMP recorded. Patient is postmenopausal.     ADVANCED DIRECTIVES:    HEALTH MAINTENANCE: Social History   Tobacco Use  . Smoking status: Never Smoker  . Smokeless tobacco: Never Used  Substance Use Topics  . Alcohol use: No    Alcohol/week: 0.0 standard drinks  . Drug use: No     Allergies  Allergen Reactions  . Aspirin     Other reaction(s): Distress (finding)  . Diazepam     Other reaction(s): Itching of Skin sneezing  . Morphine     Other reaction(s): Itching of Skin  .  Other     Other reaction(s): Unknown seasonal allergies  . Tetanus Toxoids     Other reaction(s): Localized superficial swelling of skin    Current Outpatient Medications  Medication Sig Dispense Refill  . acetaminophen (TYLENOL) 325 MG tablet Take 2 tablets (650 mg total) by mouth every 6 (six) hours as needed for mild pain (or Fever >/= 101). 100 tablet 0  . albuterol (PROVENTIL  HFA;VENTOLIN HFA) 108 (90 BASE) MCG/ACT inhaler Inhale 2 puffs into the lungs every 6 (six) hours as needed for wheezing or shortness of breath.    . ALPRAZolam (XANAX) 0.5 MG tablet Take 1 tablet (0.5 mg total) by mouth 3 (three) times daily as needed for anxiety. 20 tablet 0  . buPROPion (WELLBUTRIN XL) 150 MG 24 hr tablet Take 150 mg by mouth daily.    . busPIRone (BUSPAR) 15 MG tablet Take 1 tablet by mouth 1 day or 1 dose.    . Calcium Carbonate-Vitamin D (CALCIUM 600+D) 600-400 MG-UNIT per tablet Take 1 tablet by mouth daily.    . ciclopirox (PENLAC) 8 % solution Apply topically.    . citalopram (CELEXA) 40 MG tablet Take 40 mg by mouth daily.    . Cyanocobalamin (RA VITAMIN B-12 TR) 1000 MCG TBCR Take 1,000 mcg by mouth daily.     Marland Kitchen donepezil (ARICEPT) 5 MG tablet Take 10 mg by mouth at bedtime.     . fluticasone (FLONASE) 50 MCG/ACT nasal spray Place 2 sprays into both nostrils daily.     . Fluticasone-Salmeterol (ADVAIR) 250-50 MCG/DOSE AEPB Inhale 1 puff into the lungs daily as needed.     . gabapentin (NEURONTIN) 300 MG capsule Take 300 mg by mouth 4 (four) times daily.    Marland Kitchen HYDROcodone-acetaminophen (NORCO) 7.5-325 MG tablet Take 1 tablet by mouth every 4 (four) hours as needed for moderate pain or severe pain. 25 tablet 0  . levothyroxine (SYNTHROID, LEVOTHROID) 50 MCG tablet Take 50 mcg by mouth daily before breakfast. Take 30 to 60 minutes before breakfast.    . memantine (NAMENDA) 5 MG tablet Take 5 mg by mouth 2 (two) times daily.     . memantine (NAMENDA) 5 MG tablet Take 1 tablet by mouth 1 day or 1 dose.    . montelukast (SINGULAIR) 10 MG tablet Take 10 mg by mouth at bedtime.     . Multiple Vitamins-Minerals (OCUVITE-LUTEIN PO) Take 1 capsule by mouth daily.     Marland Kitchen NARCAN 4 MG/0.1ML LIQD nasal spray kit CALL 911. ADMINISTER A SINGLE SPRAY OF NARCAN IN ONE NOSTRIL. REPEAT EVERY 3 MINUTES AS NEEDED IF NO OR MINIMAL RESPONSE.    Marland Kitchen nystatin ointment (MYCOSTATIN) APPLY OINTMENT  TOPICALLY TWICE DAILY  3  . oxybutynin (DITROPAN) 5 MG tablet Take 5 mg by mouth 3 (three) times daily.    . pantoprazole (PROTONIX) 20 MG tablet Take 20 mg by mouth 2 (two) times daily.     . pravastatin (PRAVACHOL) 20 MG tablet Take 20 mg by mouth daily.    . predniSONE (DELTASONE) 10 MG tablet Take 10 mg by mouth daily.   1  . ranitidine (ZANTAC) 300 MG tablet Take 1 tablet by mouth at bedtime.    . SSD 1 % cream Apply topically 2 (two) times daily.  1  . traMADol (ULTRAM) 50 MG tablet Take 50 mg by mouth 2 (two) times daily as needed.  5  . triamcinolone cream (KENALOG) 0.1 % Apply topically.    . vitamin C (ASCORBIC  ACID) 500 MG tablet Take 500 mg by mouth daily.    . vitamin E (E-400) 400 UNIT capsule Take 400 Units by mouth daily.     No current facility-administered medications for this visit.     OBJECTIVE: BP (!) 150/85   Pulse 75   Temp 98 F (36.7 C) (Tympanic)   Resp 18   Wt 139 lb (63 kg)   BMI 22.78 kg/m    Body mass index is 22.78 kg/m.    ECOG FS:1 - Symptomatic but completely ambulatory  General: Well-developed, well-nourished, no acute distress. Eyes: Pink conjunctiva, anicteric sclera.  Ecchymosis under left eye. HEENT: Normocephalic, moist mucous membranes. Lungs: Clear to auscultation bilaterally. Heart: Regular rate and rhythm. No rubs, murmurs, or gallops. Abdomen: Soft, nontender, nondistended. No organomegaly noted, normoactive bowel sounds. Musculoskeletal: No edema, cyanosis, or clubbing. Neuro: Alert, answering all questions appropriately.  Mildly confused.  Cranial nerves grossly intact. Skin: Ecchymosis noted on bilateral upper extremities. Psych: Normal affect.  LAB RESULTS:  Appointment on 03/09/2019  Component Date Value Ref Range Status  . WBC 03/09/2019 11.5* 4.0 - 10.5 K/uL Final  . RBC 03/09/2019 4.28  3.87 - 5.11 MIL/uL Final  . Hemoglobin 03/09/2019 10.8* 12.0 - 15.0 g/dL Final  . HCT 03/09/2019 35.4* 36.0 - 46.0 % Final  . MCV  03/09/2019 82.7  80.0 - 100.0 fL Final  . MCH 03/09/2019 25.2* 26.0 - 34.0 pg Final  . MCHC 03/09/2019 30.5  30.0 - 36.0 g/dL Final  . RDW 03/09/2019 28.9* 11.5 - 15.5 % Final  . Platelets 03/09/2019 16* 150 - 400 K/uL Final   Comment: REPEATED TO VERIFY PLATELET COUNT CONFIRMED BY SMEAR PLATELETS APPEAR DECREASED Immature Platelet Fraction may be clinically indicated, consider ordering this additional test [id number]   . nRBC 03/09/2019 0.2  0.0 - 0.2 % Final  . Neutrophils Relative % 03/09/2019 67  % Final  . Neutro Abs 03/09/2019 7.7  1.7 - 7.7 K/uL Final  . Lymphocytes Relative 03/09/2019 24  % Final  . Lymphs Abs 03/09/2019 2.7  0.7 - 4.0 K/uL Final  . Monocytes Relative 03/09/2019 7  % Final  . Monocytes Absolute 03/09/2019 0.8  0.1 - 1.0 K/uL Final  . Eosinophils Relative 03/09/2019 1  % Final  . Eosinophils Absolute 03/09/2019 0.2  0.0 - 0.5 K/uL Final  . Basophils Relative 03/09/2019 1  % Final  . Basophils Absolute 03/09/2019 0.1  0.0 - 0.1 K/uL Final  . WBC Morphology 03/09/2019 MORPHOLOGY UNREMARKABLE   Final  . Smear Review 03/09/2019 PLATELETS APPEAR DECREASED   Final   Comment: PLATELETS APPEAR INCREASED LAGE BODY PLATELETS NOTED ON SMEAR   . Immature Granulocytes 03/09/2019 0  % Final  . Abs Immature Granulocytes 03/09/2019 0.05  0.00 - 0.07 K/uL Final  . Target Cells 03/09/2019 PRESENT   Final   Performed at [facility], [address] [place], [place] [address]    STUDIES: No results found.  ASSESSMENT:  Chronic refractory ITP  PLAN:   1. Chronic refractory ITP: Patient had a poor response to Prednisone, WinRho, and Rituxan. She is status post splenectomy in 2007.  Patient's platelet count remains decreased and is 16 today.  Increased dose of Nplate to 9 mcg/kg today.  This is the maximum dose.  Return to clinic in 4 weeks for further evaluation and continuation of treatment. If patient becomes refractory to treatment, can consider increasing  frequency of Nplate every 2 to 3 weeks, Promacta 12.'5mg'$  or Tavalisse  100 mg BID at a later date if necessary. 2. Weakness and fatigue: Chronic and unchanged. 3. Depression/dementia: Chronic and unchanged.  Patient is now at [employer]. 4.  Fall: Patient denies any falls recently. 5.  Anemia: Hemoglobin decreased, but improved to 10.8.  I spent a total of 30 minutes face-to-face with the patient of which greater than 50% of the visit was spent in counseling and coordination of care as detailed above.   Patient expressed understanding and was in agreement with this plan. She also understands that She can call clinic at any time with any questions, concerns, or complaints.    Lloyd Huger, MD   03/10/2019 8:39 AM

## 2019-03-09 ENCOUNTER — Inpatient Hospital Stay: Payer: Medicare HMO

## 2019-03-09 ENCOUNTER — Encounter (INDEPENDENT_AMBULATORY_CARE_PROVIDER_SITE_OTHER): Payer: Self-pay

## 2019-03-09 ENCOUNTER — Inpatient Hospital Stay: Payer: Medicare HMO | Attending: Oncology

## 2019-03-09 ENCOUNTER — Inpatient Hospital Stay (HOSPITAL_BASED_OUTPATIENT_CLINIC_OR_DEPARTMENT_OTHER): Payer: Medicare HMO | Admitting: Oncology

## 2019-03-09 ENCOUNTER — Other Ambulatory Visit: Payer: Self-pay

## 2019-03-09 VITALS — BP 150/85 | HR 75 | Temp 98.0°F | Resp 18 | Wt 139.0 lb

## 2019-03-09 DIAGNOSIS — Z79899 Other long term (current) drug therapy: Secondary | ICD-10-CM

## 2019-03-09 DIAGNOSIS — R5383 Other fatigue: Secondary | ICD-10-CM | POA: Diagnosis not present

## 2019-03-09 DIAGNOSIS — D649 Anemia, unspecified: Secondary | ICD-10-CM | POA: Diagnosis not present

## 2019-03-09 DIAGNOSIS — F329 Major depressive disorder, single episode, unspecified: Secondary | ICD-10-CM

## 2019-03-09 DIAGNOSIS — D693 Immune thrombocytopenic purpura: Secondary | ICD-10-CM

## 2019-03-09 DIAGNOSIS — Z86711 Personal history of pulmonary embolism: Secondary | ICD-10-CM

## 2019-03-09 DIAGNOSIS — R410 Disorientation, unspecified: Secondary | ICD-10-CM

## 2019-03-09 DIAGNOSIS — R5382 Chronic fatigue, unspecified: Secondary | ICD-10-CM | POA: Diagnosis not present

## 2019-03-09 DIAGNOSIS — Z9181 History of falling: Secondary | ICD-10-CM

## 2019-03-09 DIAGNOSIS — Z9081 Acquired absence of spleen: Secondary | ICD-10-CM

## 2019-03-09 DIAGNOSIS — F0390 Unspecified dementia without behavioral disturbance: Secondary | ICD-10-CM

## 2019-03-09 LAB — CBC WITH DIFFERENTIAL/PLATELET
Abs Immature Granulocytes: 0.05 10*3/uL (ref 0.00–0.07)
Basophils Absolute: 0.1 10*3/uL (ref 0.0–0.1)
Basophils Relative: 1 %
Eosinophils Absolute: 0.2 10*3/uL (ref 0.0–0.5)
Eosinophils Relative: 1 %
HCT: 35.4 % — ABNORMAL LOW (ref 36.0–46.0)
Hemoglobin: 10.8 g/dL — ABNORMAL LOW (ref 12.0–15.0)
Immature Granulocytes: 0 %
Lymphocytes Relative: 24 %
Lymphs Abs: 2.7 10*3/uL (ref 0.7–4.0)
MCH: 25.2 pg — ABNORMAL LOW (ref 26.0–34.0)
MCHC: 30.5 g/dL (ref 30.0–36.0)
MCV: 82.7 fL (ref 80.0–100.0)
Monocytes Absolute: 0.8 10*3/uL (ref 0.1–1.0)
Monocytes Relative: 7 %
Neutro Abs: 7.7 10*3/uL (ref 1.7–7.7)
Neutrophils Relative %: 67 %
Platelets: 16 10*3/uL — CL (ref 150–400)
RBC: 4.28 MIL/uL (ref 3.87–5.11)
RDW: 28.9 % — ABNORMAL HIGH (ref 11.5–15.5)
Smear Review: DECREASED
WBC: 11.5 10*3/uL — ABNORMAL HIGH (ref 4.0–10.5)
nRBC: 0.2 % (ref 0.0–0.2)

## 2019-03-09 MED ORDER — ROMIPLOSTIM 250 MCG ~~LOC~~ SOLR: 9.0000 ug/kg | Freq: Once | SUBCUTANEOUS | Status: DC

## 2019-03-09 MED ORDER — ROMIPLOSTIM INJECTION 500 MCG
10.0000 ug/kg | Freq: Once | SUBCUTANEOUS | Status: AC
  Administered 2019-03-09: 630 ug via SUBCUTANEOUS
  Filled 2019-03-09: qty 1

## 2019-03-09 NOTE — Progress Notes (Signed)
Patient denies any concerns today.  

## 2019-03-10 ENCOUNTER — Ambulatory Visit: Payer: Medicare HMO | Admitting: Oncology

## 2019-03-10 ENCOUNTER — Other Ambulatory Visit: Payer: Medicare HMO

## 2019-03-10 ENCOUNTER — Ambulatory Visit: Payer: Medicare HMO

## 2019-03-29 DIAGNOSIS — M65872 Other synovitis and tenosynovitis, left ankle and foot: Secondary | ICD-10-CM | POA: Diagnosis not present

## 2019-03-29 DIAGNOSIS — M7672 Peroneal tendinitis, left leg: Secondary | ICD-10-CM | POA: Diagnosis not present

## 2019-04-01 NOTE — Progress Notes (Signed)
[place]  [phone]  [phone]     Ariana [name] DOB: 01/15/1933  MR#: [id number]  EGB#:[id number]  Patient Care Team: Idelle Crouch, MD as PCP - General (Internal Medicine)  CHIEF COMPLAINT: Chronic refractory ITP  INTERVAL HISTORY: Patient returns to clinic today for repeat laboratory work, further evaluation, and continuation of Nplate.  She denies any recent falls or increased bruising. She continues to have increased confusion and is a poor historian.  She has no neurologic complaints.  She denies any recent fevers or illnesses.  She denies any chest pain, shortness of breath, cough, or hemoptysis.  She denies any nausea, vomiting, constipation, or diarrhea.  She has no melena or hematochezia.  She has no urinary complaints.  Patient offers no specific complaints today.  REVIEW OF SYSTEMS:   Review of Systems  Constitutional: Negative.  Negative for fever, malaise/fatigue and weight loss.  HENT: Negative for congestion.   Respiratory: Negative.  Negative for cough, hemoptysis and shortness of breath.   Cardiovascular: Negative.  Negative for chest pain and leg swelling.  Gastrointestinal: Negative.  Negative for abdominal pain, blood in stool, diarrhea, melena and nausea.  Genitourinary: Negative.  Negative for dysuria and hematuria.  Musculoskeletal: Negative.  Negative for back pain and falls.  Skin: Negative.  Negative for rash.  Neurological: Negative.  Negative for sensory change, focal weakness and weakness.  Endo/Heme/Allergies: Bruises/bleeds easily.  Psychiatric/Behavioral: Positive for memory loss. Negative for depression. The patient is not nervous/anxious.     As per HPI. Otherwise, a complete review of systems is negative.  PAST MEDICAL HISTORY: Past Medical History:  Diagnosis Date  . Alzheimer disease ([place])   . Anemia   . Anxiety   . Asthma   . Chronic back pain   . Depression   . Depression   . GERD  (gastroesophageal reflux disease)   . Hiatal hernia   . Hypercholesteremia   . ITP (idiopathic thrombocytopenic purpura)   . Osteoarthritis   . Osteoporosis   . Pulmonary emboli ([place])   . Restless legs     PAST SURGICAL HISTORY: Past Surgical History:  Procedure Laterality Date  . CARPAL TUNNEL RELEASE    . ESOPHAGOGASTRODUODENOSCOPY (EGD) WITH PROPOFOL N/A 09/10/2015   Procedure: ESOPHAGOGASTRODUODENOSCOPY (EGD) WITH PROPOFOL;  Surgeon: Lollie Sails, MD;  Location: [facility] ENDOSCOPY;  Service: Endoscopy;  Laterality: N/A;  . IRRIGATION AND DEBRIDEMENT HEMATOMA Right 07/13/2018   Procedure: IRRIGATION AND DEBRIDEMENT HEMATOMA-RIGHT SHIN;  Surgeon: Herbert Pun, MD;  Location: [facility] ORS;  Service: General;  Laterality: Right;  . LUMBAR DISC SURGERY    . SPLENECTOMY, PARTIAL    . TOTAL ABDOMINAL HYSTERECTOMY      FAMILY HISTORY Family History  Problem Relation Age of Onset  . Stroke Mother     GYNECOLOGIC HISTORY:  No LMP recorded. Patient is postmenopausal.     ADVANCED DIRECTIVES:    HEALTH MAINTENANCE: Social History   Tobacco Use  . Smoking status: Never Smoker  . Smokeless tobacco: Never Used  Substance Use Topics  . Alcohol use: No    Alcohol/week: 0.0 standard drinks  . Drug use: No     Allergies  Allergen Reactions  . Aspirin     Other reaction(s): Distress (finding)  . Diazepam     Other reaction(s): Itching of Skin sneezing  . Morphine     Other reaction(s): Itching of Skin  . Other     Other reaction(s): Unknown seasonal allergies  . Tetanus Toxoids  Other reaction(s): Localized superficial swelling of skin    Current Outpatient Medications  Medication Sig Dispense Refill  . acetaminophen (TYLENOL) 325 MG tablet Take 2 tablets (650 mg total) by mouth every 6 (six) hours as needed for mild pain (or Fever >/= 101). 100 tablet 0  . albuterol (PROVENTIL HFA;VENTOLIN HFA) 108 (90 BASE) MCG/ACT inhaler Inhale 2 puffs into the lungs  every 6 (six) hours as needed for wheezing or shortness of breath.    . ALPRAZolam (XANAX) 0.5 MG tablet Take 1 tablet (0.5 mg total) by mouth 3 (three) times daily as needed for anxiety. 20 tablet 0  . buPROPion (WELLBUTRIN XL) 150 MG 24 hr tablet Take 150 mg by mouth daily.    . busPIRone (BUSPAR) 15 MG tablet Take 1 tablet by mouth 1 day or 1 dose.    . Calcium Carbonate-Vitamin D (CALCIUM 600+D) 600-400 MG-UNIT per tablet Take 1 tablet by mouth daily.    . ciclopirox (PENLAC) 8 % solution Apply topically.    . citalopram (CELEXA) 40 MG tablet Take 40 mg by mouth daily.    . Cyanocobalamin (RA VITAMIN B-12 TR) 1000 MCG TBCR Take 1,000 mcg by mouth daily.     Marland Kitchen donepezil (ARICEPT) 10 MG tablet Take 1 tablet by mouth at bedtime.    . fluticasone (FLONASE) 50 MCG/ACT nasal spray Place 2 sprays into both nostrils daily.     . Fluticasone-Salmeterol (ADVAIR) 250-50 MCG/DOSE AEPB Inhale 1 puff into the lungs daily as needed.     . gabapentin (NEURONTIN) 300 MG capsule Take 300 mg by mouth 4 (four) times daily.    Marland Kitchen HYDROcodone-acetaminophen (NORCO) 7.5-325 MG tablet Take 1 tablet by mouth every 4 (four) hours as needed for moderate pain or severe pain. 25 tablet 0  . levothyroxine (SYNTHROID, LEVOTHROID) 50 MCG tablet Take 50 mcg by mouth daily before breakfast. Take 30 to 60 minutes before breakfast.    . memantine (NAMENDA) 10 MG tablet Take 1 tablet by mouth 2 (two) times a day.    . montelukast (SINGULAIR) 10 MG tablet Take 10 mg by mouth at bedtime.     . Multiple Vitamins-Minerals (OCUVITE-LUTEIN PO) Take 1 capsule by mouth daily.     Marland Kitchen NARCAN 4 MG/0.1ML LIQD nasal spray kit CALL 911. ADMINISTER A SINGLE SPRAY OF NARCAN IN ONE NOSTRIL. REPEAT EVERY 3 MINUTES AS NEEDED IF NO OR MINIMAL RESPONSE.    Marland Kitchen nystatin ointment (MYCOSTATIN) APPLY OINTMENT TOPICALLY TWICE DAILY  3  . oxybutynin (DITROPAN) 5 MG tablet Take 5 mg by mouth 3 (three) times daily.    . pantoprazole (PROTONIX) 20 MG tablet Take  20 mg by mouth 2 (two) times daily.     . pravastatin (PRAVACHOL) 20 MG tablet Take 20 mg by mouth daily.    . predniSONE (DELTASONE) 10 MG tablet Take 10 mg by mouth daily.   1  . ranitidine (ZANTAC) 300 MG tablet Take 1 tablet by mouth at bedtime.    . SSD 1 % cream Apply topically 2 (two) times daily.  1  . traMADol (ULTRAM) 50 MG tablet Take 50 mg by mouth 2 (two) times daily as needed.  5  . triamcinolone cream (KENALOG) 0.1 % Apply topically.    . vitamin C (ASCORBIC ACID) 500 MG tablet Take 500 mg by mouth daily.    . vitamin E (E-400) 400 UNIT capsule Take 400 Units by mouth daily.     No current facility-administered medications for this visit.  OBJECTIVE: BP (!) 142/72 (BP Location: Left Arm, Patient Position: Sitting)   Pulse (!) 59   Temp 98 F (36.7 C) (Tympanic)   Wt 133 lb 14.4 oz (60.7 kg)   BMI 21.94 kg/m    Body mass index is 21.94 kg/m.    ECOG FS:1 - Symptomatic but completely ambulatory  General: Well-developed, well-nourished, no acute distress. Eyes: Pink conjunctiva, anicteric sclera. HEENT: Normocephalic, moist mucous membranes. Lungs: Clear to auscultation bilaterally. Heart: Regular rate and rhythm. No rubs, murmurs, or gallops. Abdomen: Soft, nontender, nondistended. No organomegaly noted, normoactive bowel sounds. Musculoskeletal: No edema, cyanosis, or clubbing. Neuro: Alert, answering all questions appropriately. Cranial nerves grossly intact. Skin: No rashes or petechiae noted. Psych: Normal affect.  LAB RESULTS:  Appointment on 04/06/2019  Component Date Value Ref Range Status  . WBC 04/06/2019 9.2  4.0 - 10.5 K/uL Final  . RBC 04/06/2019 4.75  3.87 - 5.11 MIL/uL Final  . Hemoglobin 04/06/2019 12.7  12.0 - 15.0 g/dL Final  . HCT 04/06/2019 40.0  36.0 - 46.0 % Final  . MCV 04/06/2019 84.2  80.0 - 100.0 fL Final  . MCH 04/06/2019 26.7  26.0 - 34.0 pg Final  . MCHC 04/06/2019 31.8  30.0 - 36.0 g/dL Final  . RDW 04/06/2019 Not Measured   11.5 - 15.5 % Final  . Platelets 04/06/2019 31* 150 - 400 K/uL Final   Comment: Immature Platelet Fraction may be clinically indicated, consider ordering this additional test [id number]   . nRBC 04/06/2019 0.0  0.0 - 0.2 % Final  . Neutrophils Relative % 04/06/2019 56  % Final  . Neutro Abs 04/06/2019 5.2  1.7 - 7.7 K/uL Final  . Lymphocytes Relative 04/06/2019 33  % Final  . Lymphs Abs 04/06/2019 3.0  0.7 - 4.0 K/uL Final  . Monocytes Relative 04/06/2019 9  % Final  . Monocytes Absolute 04/06/2019 0.8  0.1 - 1.0 K/uL Final  . Eosinophils Relative 04/06/2019 1  % Final  . Eosinophils Absolute 04/06/2019 0.1  0.0 - 0.5 K/uL Final  . Basophils Relative 04/06/2019 1  % Final  . Basophils Absolute 04/06/2019 0.1  0.0 - 0.1 K/uL Final  . Smear Review 04/06/2019 PLATELETS APPEAR DECREASED   Final  . Immature Granulocytes 04/06/2019 0  % Final  . Abs Immature Granulocytes 04/06/2019 0.04  0.00 - 0.07 K/uL Final  . Target Cells 04/06/2019 PRESENT   Final  . Giant PLTs 04/06/2019 PRESENT   Final   Performed at [facility], [address] [place], [place] [address]    STUDIES: No results found.  ASSESSMENT:  Chronic refractory ITP  PLAN:   1. Chronic refractory ITP: Patient had a poor response to Prednisone, WinRho, and Rituxan. She is status post splenectomy in 2007.  Patient platelet count has mildly improved and is 31 today.  Patient is currently receiving the max dose of Nplate at 10 mcg/kg.  Return to clinic in 2 weeks for repeat laboratory work and further evaluation.  If patient becomes refractory to treatment, can consider increasing frequency of Nplate every 2 to 3 weeks, Promacta 12.1m or Tavalisse 100 mg BID at a later date if necessary. 2. Weakness and fatigue: Chronic and unchanged. 3. Depression/dementia: Chronic and unchanged.  Patient is now at [employer] [facility].  Fall: Patient denies any falls recently. 5.  Anemia: Resolved.  I spent a total of 30 minutes  face-to-face with the patient of which greater than 50% of the visit was spent in counseling  and coordination of care as detailed above.  Patient expressed understanding and was in agreement with this plan. She also understands that She can call clinic at any time with any questions, concerns, or complaints.    Lloyd Huger, MD   04/07/2019 1:36 PM

## 2019-04-03 DIAGNOSIS — G301 Alzheimer's disease with late onset: Secondary | ICD-10-CM | POA: Diagnosis not present

## 2019-04-03 DIAGNOSIS — F0280 Dementia in other diseases classified elsewhere without behavioral disturbance: Secondary | ICD-10-CM | POA: Diagnosis not present

## 2019-04-03 DIAGNOSIS — F419 Anxiety disorder, unspecified: Secondary | ICD-10-CM | POA: Diagnosis not present

## 2019-04-03 DIAGNOSIS — F329 Major depressive disorder, single episode, unspecified: Secondary | ICD-10-CM | POA: Diagnosis not present

## 2019-04-03 DIAGNOSIS — R2689 Other abnormalities of gait and mobility: Secondary | ICD-10-CM | POA: Diagnosis not present

## 2019-04-06 ENCOUNTER — Inpatient Hospital Stay: Payer: Medicare HMO | Admitting: Oncology

## 2019-04-06 ENCOUNTER — Other Ambulatory Visit: Payer: Self-pay

## 2019-04-06 ENCOUNTER — Inpatient Hospital Stay: Payer: Medicare HMO | Attending: Oncology

## 2019-04-06 ENCOUNTER — Encounter: Payer: Self-pay | Admitting: Oncology

## 2019-04-06 ENCOUNTER — Inpatient Hospital Stay: Payer: Medicare HMO

## 2019-04-06 VITALS — BP 142/72 | HR 59 | Temp 98.0°F | Wt 133.9 lb

## 2019-04-06 DIAGNOSIS — F419 Anxiety disorder, unspecified: Secondary | ICD-10-CM | POA: Insufficient documentation

## 2019-04-06 DIAGNOSIS — D693 Immune thrombocytopenic purpura: Secondary | ICD-10-CM | POA: Diagnosis not present

## 2019-04-06 DIAGNOSIS — E7800 Pure hypercholesterolemia, unspecified: Secondary | ICD-10-CM | POA: Insufficient documentation

## 2019-04-06 DIAGNOSIS — J45909 Unspecified asthma, uncomplicated: Secondary | ICD-10-CM | POA: Insufficient documentation

## 2019-04-06 DIAGNOSIS — F0280 Dementia in other diseases classified elsewhere without behavioral disturbance: Secondary | ICD-10-CM | POA: Insufficient documentation

## 2019-04-06 DIAGNOSIS — Z79899 Other long term (current) drug therapy: Secondary | ICD-10-CM | POA: Insufficient documentation

## 2019-04-06 DIAGNOSIS — F329 Major depressive disorder, single episode, unspecified: Secondary | ICD-10-CM | POA: Insufficient documentation

## 2019-04-06 DIAGNOSIS — Z7951 Long term (current) use of inhaled steroids: Secondary | ICD-10-CM

## 2019-04-06 DIAGNOSIS — D649 Anemia, unspecified: Secondary | ICD-10-CM | POA: Insufficient documentation

## 2019-04-06 DIAGNOSIS — Z86711 Personal history of pulmonary embolism: Secondary | ICD-10-CM

## 2019-04-06 DIAGNOSIS — Z9081 Acquired absence of spleen: Secondary | ICD-10-CM | POA: Diagnosis not present

## 2019-04-06 DIAGNOSIS — G2581 Restless legs syndrome: Secondary | ICD-10-CM

## 2019-04-06 LAB — CBC WITH DIFFERENTIAL/PLATELET
Abs Immature Granulocytes: 0.04 10*3/uL (ref 0.00–0.07)
Basophils Absolute: 0.1 10*3/uL (ref 0.0–0.1)
Basophils Relative: 1 %
Eosinophils Absolute: 0.1 10*3/uL (ref 0.0–0.5)
Eosinophils Relative: 1 %
HCT: 40 % (ref 36.0–46.0)
Hemoglobin: 12.7 g/dL (ref 12.0–15.0)
Immature Granulocytes: 0 %
Lymphocytes Relative: 33 %
Lymphs Abs: 3 10*3/uL (ref 0.7–4.0)
MCH: 26.7 pg (ref 26.0–34.0)
MCHC: 31.8 g/dL (ref 30.0–36.0)
MCV: 84.2 fL (ref 80.0–100.0)
Monocytes Absolute: 0.8 10*3/uL (ref 0.1–1.0)
Monocytes Relative: 9 %
Neutro Abs: 5.2 10*3/uL (ref 1.7–7.7)
Neutrophils Relative %: 56 %
Platelets: 31 10*3/uL — ABNORMAL LOW (ref 150–400)
RBC: 4.75 MIL/uL (ref 3.87–5.11)
Smear Review: DECREASED
WBC: 9.2 10*3/uL (ref 4.0–10.5)
nRBC: 0 % (ref 0.0–0.2)

## 2019-04-06 MED ORDER — ROMIPLOSTIM INJECTION 500 MCG
9.9000 ug/kg | Freq: Once | SUBCUTANEOUS | Status: AC
  Administered 2019-04-06: 600 ug via SUBCUTANEOUS
  Filled 2019-04-06: qty 0.5

## 2019-04-06 NOTE — Progress Notes (Signed)
Patient stated that she had been feeling weak and tired. Patient denied fever, chills, nausea, vomiting, diarrhea or constipation.

## 2019-04-09 DIAGNOSIS — F419 Anxiety disorder, unspecified: Secondary | ICD-10-CM | POA: Diagnosis not present

## 2019-04-09 DIAGNOSIS — G8929 Other chronic pain: Secondary | ICD-10-CM | POA: Diagnosis not present

## 2019-04-09 DIAGNOSIS — R58 Hemorrhage, not elsewhere classified: Secondary | ICD-10-CM | POA: Diagnosis not present

## 2019-04-09 DIAGNOSIS — F0390 Unspecified dementia without behavioral disturbance: Secondary | ICD-10-CM | POA: Diagnosis not present

## 2019-04-09 DIAGNOSIS — S8991XA Unspecified injury of right lower leg, initial encounter: Secondary | ICD-10-CM | POA: Diagnosis not present

## 2019-04-09 DIAGNOSIS — I10 Essential (primary) hypertension: Secondary | ICD-10-CM | POA: Diagnosis not present

## 2019-04-09 DIAGNOSIS — F329 Major depressive disorder, single episode, unspecified: Secondary | ICD-10-CM | POA: Diagnosis not present

## 2019-04-09 DIAGNOSIS — S51011A Laceration without foreign body of right elbow, initial encounter: Secondary | ICD-10-CM | POA: Diagnosis not present

## 2019-04-09 DIAGNOSIS — D696 Thrombocytopenia, unspecified: Secondary | ICD-10-CM | POA: Diagnosis not present

## 2019-04-09 DIAGNOSIS — W19XXXA Unspecified fall, initial encounter: Secondary | ICD-10-CM | POA: Diagnosis not present

## 2019-04-09 DIAGNOSIS — M549 Dorsalgia, unspecified: Secondary | ICD-10-CM | POA: Diagnosis not present

## 2019-04-09 DIAGNOSIS — S59901A Unspecified injury of right elbow, initial encounter: Secondary | ICD-10-CM | POA: Diagnosis not present

## 2019-04-09 DIAGNOSIS — M79661 Pain in right lower leg: Secondary | ICD-10-CM | POA: Diagnosis not present

## 2019-04-09 DIAGNOSIS — I959 Hypotension, unspecified: Secondary | ICD-10-CM | POA: Diagnosis not present

## 2019-04-09 DIAGNOSIS — S81811A Laceration without foreign body, right lower leg, initial encounter: Secondary | ICD-10-CM | POA: Diagnosis not present

## 2019-04-09 DIAGNOSIS — D693 Immune thrombocytopenic purpura: Secondary | ICD-10-CM | POA: Diagnosis not present

## 2019-04-09 DIAGNOSIS — M25521 Pain in right elbow: Secondary | ICD-10-CM | POA: Diagnosis not present

## 2019-04-10 DIAGNOSIS — F419 Anxiety disorder, unspecified: Secondary | ICD-10-CM | POA: Diagnosis not present

## 2019-04-10 DIAGNOSIS — W19XXXA Unspecified fall, initial encounter: Secondary | ICD-10-CM | POA: Diagnosis not present

## 2019-04-10 DIAGNOSIS — F329 Major depressive disorder, single episode, unspecified: Secondary | ICD-10-CM | POA: Diagnosis not present

## 2019-04-10 DIAGNOSIS — D693 Immune thrombocytopenic purpura: Secondary | ICD-10-CM | POA: Diagnosis not present

## 2019-04-10 DIAGNOSIS — S81812A Laceration without foreign body, left lower leg, initial encounter: Secondary | ICD-10-CM | POA: Insufficient documentation

## 2019-04-10 DIAGNOSIS — F0390 Unspecified dementia without behavioral disturbance: Secondary | ICD-10-CM | POA: Diagnosis not present

## 2019-04-11 DIAGNOSIS — S81812A Laceration without foreign body, left lower leg, initial encounter: Secondary | ICD-10-CM | POA: Diagnosis not present

## 2019-04-11 DIAGNOSIS — D693 Immune thrombocytopenic purpura: Secondary | ICD-10-CM | POA: Diagnosis not present

## 2019-04-11 DIAGNOSIS — F0390 Unspecified dementia without behavioral disturbance: Secondary | ICD-10-CM | POA: Diagnosis not present

## 2019-04-11 DIAGNOSIS — F329 Major depressive disorder, single episode, unspecified: Secondary | ICD-10-CM | POA: Diagnosis not present

## 2019-04-11 DIAGNOSIS — W19XXXA Unspecified fall, initial encounter: Secondary | ICD-10-CM | POA: Diagnosis not present

## 2019-04-12 DIAGNOSIS — D696 Thrombocytopenia, unspecified: Secondary | ICD-10-CM | POA: Diagnosis not present

## 2019-04-12 DIAGNOSIS — R2689 Other abnormalities of gait and mobility: Secondary | ICD-10-CM | POA: Diagnosis not present

## 2019-04-13 DIAGNOSIS — R296 Repeated falls: Secondary | ICD-10-CM | POA: Diagnosis not present

## 2019-04-13 DIAGNOSIS — S51811D Laceration without foreign body of right forearm, subsequent encounter: Secondary | ICD-10-CM | POA: Diagnosis not present

## 2019-04-13 DIAGNOSIS — S71111D Laceration without foreign body, right thigh, subsequent encounter: Secondary | ICD-10-CM | POA: Diagnosis not present

## 2019-04-13 DIAGNOSIS — S51012D Laceration without foreign body of left elbow, subsequent encounter: Secondary | ICD-10-CM | POA: Diagnosis not present

## 2019-04-13 DIAGNOSIS — D693 Immune thrombocytopenic purpura: Secondary | ICD-10-CM | POA: Diagnosis not present

## 2019-04-13 DIAGNOSIS — Z4800 Encounter for change or removal of nonsurgical wound dressing: Secondary | ICD-10-CM | POA: Diagnosis not present

## 2019-04-13 DIAGNOSIS — S51011D Laceration without foreign body of right elbow, subsequent encounter: Secondary | ICD-10-CM | POA: Diagnosis not present

## 2019-04-13 DIAGNOSIS — W19XXXD Unspecified fall, subsequent encounter: Secondary | ICD-10-CM | POA: Diagnosis not present

## 2019-04-13 DIAGNOSIS — S81811D Laceration without foreign body, right lower leg, subsequent encounter: Secondary | ICD-10-CM | POA: Diagnosis not present

## 2019-04-15 NOTE — Progress Notes (Signed)
[facility]  [phone]  [phone]     Ariana [name] DOB: 08/13/1933  MR#: [id number]  CSN#:[id number]  Patient Care Team: Sparks, Jeffrey D, MD as PCP - General (Internal Medicine)  CHIEF COMPLAINT: Chronic refractory ITP  INTERVAL HISTORY: Patient returns to clinic today for further evaluation and continuation of Nplate.  She was recently admitted to [facility] hospital after a fall and severe leg bleed.  Her platelet count at that time was found to be 8 and she received both Nplate and IVIG while in the hospital.  She continues to have increased confusion and is a poor historian.  She has no neurologic complaints.  She denies any recent fevers or illnesses.  She denies any chest pain, shortness of breath, cough, or hemoptysis.  She denies any nausea, vomiting, constipation, or diarrhea.  She has no melena or hematochezia.  She has no urinary complaints.  Patient offers no further specific complaints today.  REVIEW OF SYSTEMS:   Review of Systems  Constitutional: Negative.  Negative for fever, malaise/fatigue and weight loss.  HENT: Negative for congestion.   Respiratory: Negative.  Negative for cough, hemoptysis and shortness of breath.   Cardiovascular: Negative.  Negative for chest pain and leg swelling.  Gastrointestinal: Negative.  Negative for abdominal pain, blood in stool, diarrhea, melena and nausea.  Genitourinary: Negative.  Negative for dysuria and hematuria.  Musculoskeletal: Negative.  Negative for back pain and falls.  Skin: Negative.  Negative for rash.  Neurological: Negative.  Negative for sensory change, focal weakness and weakness.  Endo/Heme/Allergies: Bruises/bleeds easily.  Psychiatric/Behavioral: Positive for memory loss. Negative for depression. The patient is not nervous/anxious.     As per HPI. Otherwise, a complete review of systems is negative.  PAST MEDICAL HISTORY: Past Medical History:  Diagnosis Date  .  Alzheimer disease ([facility])   . Anemia   . Anxiety   . Asthma   . Chronic back pain   . Depression   . Depression   . GERD (gastroesophageal reflux disease)   . Hiatal hernia   . Hypercholesteremia   . ITP (idiopathic thrombocytopenic purpura)   . Osteoarthritis   . Osteoporosis   . Pulmonary emboli ([facility])   . Restless legs     PAST SURGICAL HISTORY: Past Surgical History:  Procedure Laterality Date  . CARPAL TUNNEL RELEASE    . ESOPHAGOGASTRODUODENOSCOPY (EGD) WITH PROPOFOL N/A 09/10/2015   Procedure: ESOPHAGOGASTRODUODENOSCOPY (EGD) WITH PROPOFOL;  Surgeon: Martin U Skulskie, MD;  Location: [facility] ENDOSCOPY;  Service: Endoscopy;  Laterality: N/A;  . IRRIGATION AND DEBRIDEMENT HEMATOMA Right 07/13/2018   Procedure: IRRIGATION AND DEBRIDEMENT HEMATOMA-RIGHT SHIN;  Surgeon: Cintron-Diaz, Edgardo, MD;  Location: [facility] ORS;  Service: General;  Laterality: Right;  . LUMBAR DISC SURGERY    . SPLENECTOMY, PARTIAL    . TOTAL ABDOMINAL HYSTERECTOMY      FAMILY HISTORY Family History  Problem Relation Age of Onset  . Stroke Mother     GYNECOLOGIC HISTORY:  No LMP recorded. Patient is postmenopausal.     ADVANCED DIRECTIVES:    HEALTH MAINTENANCE: Social History   Tobacco Use  . Smoking status: Never Smoker  . Smokeless tobacco: Never Used  Substance Use Topics  . Alcohol use: No    Alcohol/week: 0.0 standard drinks  . Drug use: No     Allergies  Allergen Reactions  . Aspirin     Other reaction(s): Distress (finding) Other reaction(s): Other (See Comments) Other reaction(s): Distress (finding) Other reaction(s): Unknown Other   reaction(s): Distress (finding)  . Diazepam     Other reaction(s): Itching of Skin sneezing  . Morphine     Other reaction(s): Itching of Skin  . Other     Other reaction(s): Unknown seasonal allergies  . Tetanus Toxoids     Other reaction(s): Localized superficial swelling of skin    Current Outpatient Medications  Medication Sig  Dispense Refill  . acetaminophen (TYLENOL) 325 MG tablet Take 2 tablets (650 mg total) by mouth every 6 (six) hours as needed for mild pain (or Fever >/= 101). 100 tablet 0  . albuterol (PROVENTIL HFA;VENTOLIN HFA) 108 (90 BASE) MCG/ACT inhaler Inhale 2 puffs into the lungs every 6 (six) hours as needed for wheezing or shortness of breath.    . ALPRAZolam (XANAX) 0.5 MG tablet Take 1 tablet (0.5 mg total) by mouth 3 (three) times daily as needed for anxiety. 20 tablet 0  . buPROPion (WELLBUTRIN XL) 150 MG 24 hr tablet Take 150 mg by mouth daily.    . busPIRone (BUSPAR) 15 MG tablet Take 1 tablet by mouth 1 day or 1 dose.    . Calcium Carbonate-Vit D-Min (CALCIUM 600+D PLUS MINERALS) 600-400 MG-UNIT TABS Take 1 tablet by mouth 1 day or 1 dose.    . citalopram (CELEXA) 40 MG tablet Take 40 mg by mouth daily.    . Cyanocobalamin (RA VITAMIN B-12 TR) 1000 MCG TBCR Take 1,000 mcg by mouth daily.     . donepezil (ARICEPT) 10 MG tablet Take 1 tablet by mouth at bedtime.    . fluticasone (FLONASE) 50 MCG/ACT nasal spray Place 2 sprays into both nostrils daily.     . Fluticasone-Salmeterol (ADVAIR) 250-50 MCG/DOSE AEPB Inhale 1 puff into the lungs daily as needed.     . gabapentin (NEURONTIN) 300 MG capsule Take 300 mg by mouth 4 (four) times daily.    . HYDROcodone-acetaminophen (NORCO) 7.5-325 MG tablet Take 1 tablet by mouth every 4 (four) hours as needed for moderate pain or severe pain. 25 tablet 0  . levothyroxine (SYNTHROID, LEVOTHROID) 50 MCG tablet Take 50 mcg by mouth daily before breakfast. Take 30 to 60 minutes before breakfast.    . memantine (NAMENDA) 10 MG tablet Take 1 tablet by mouth 2 (two) times a day.    . montelukast (SINGULAIR) 10 MG tablet Take 10 mg by mouth at bedtime.     . Multiple Vitamin (MULTI-VITAMIN) tablet Take 1 tablet by mouth 1 day or 1 dose.    . Multiple Vitamins-Minerals (OCUVITE-LUTEIN PO) Take 1 capsule by mouth daily.     . NARCAN 4 MG/0.1ML LIQD nasal spray kit  CALL 911. ADMINISTER A SINGLE SPRAY OF NARCAN IN ONE NOSTRIL. REPEAT EVERY 3 MINUTES AS NEEDED IF NO OR MINIMAL RESPONSE.    . nystatin ointment (MYCOSTATIN) APPLY OINTMENT TOPICALLY TWICE DAILY  3  . oxybutynin (DITROPAN) 5 MG tablet Take 5 mg by mouth 3 (three) times daily.    . pantoprazole (PROTONIX) 20 MG tablet Take 20 mg by mouth 2 (two) times daily.     . pravastatin (PRAVACHOL) 20 MG tablet Take 20 mg by mouth daily.    . predniSONE (DELTASONE) 10 MG tablet Take 10 mg by mouth daily.   1  . ranitidine (ZANTAC) 300 MG tablet Take 1 tablet by mouth at bedtime.    . SSD 1 % cream Apply topically 2 (two) times daily.  1  . traMADol (ULTRAM) 50 MG tablet Take 50 mg by mouth 2 (two)   times daily as needed.  5  . triamcinolone cream (KENALOG) 0.1 % Apply topically.    . vitamin C (ASCORBIC ACID) 500 MG tablet Take 500 mg by mouth daily.    . vitamin E (E-400) 400 UNIT capsule Take 400 Units by mouth daily.    . lidocaine (LIDODERM) 5 % Place 1 patch onto the skin every 12 (twelve) hours. Remove & Discard patch within 12 hours or as directed by MD 10 patch 0   No current facility-administered medications for this visit.     OBJECTIVE: BP 122/61 (BP Location: Left Arm, Patient Position: Sitting)   Pulse 60   Temp (!) 97 F (36.1 C) (Tympanic)   Wt 142 lb (64.4 kg)   BMI 23.27 kg/m    Body mass index is 23.27 kg/m.    ECOG FS:1 - Symptomatic but completely ambulatory  General: Well-developed, well-nourished, no acute distress. Eyes: Pink conjunctiva, anicteric sclera. HEENT: Normocephalic, moist mucous membranes. Lungs: Clear to auscultation bilaterally. Heart: Regular rate and rhythm. No rubs, murmurs, or gallops. Abdomen: Soft, nontender, nondistended. No organomegaly noted, normoactive bowel sounds. Musculoskeletal: No edema, cyanosis, or clubbing. Neuro: Alert, answering all questions appropriately. Cranial nerves grossly intact. Skin: No rashes or petechiae noted.  Wound on  right lower leg covered by surgical dressing. Psych: Normal affect.  LAB RESULTS:  Orders Only on 04/20/2019  Component Date Value Ref Range Status  . WBC 04/20/2019 12.0* 4.0 - 10.5 K/uL Final  . RBC 04/20/2019 3.58* 3.87 - 5.11 MIL/uL Final  . Hemoglobin 04/20/2019 9.6* 12.0 - 15.0 g/dL Final  . HCT 04/20/2019 31.5* 36.0 - 46.0 % Final  . MCV 04/20/2019 88.0  80.0 - 100.0 fL Final  . MCH 04/20/2019 26.8  26.0 - 34.0 pg Final  . MCHC 04/20/2019 30.5  30.0 - 36.0 g/dL Final  . RDW 04/20/2019 Not Measured  11.5 - 15.5 % Final  . Platelets 04/20/2019 448* 150 - 400 K/uL Final  . nRBC 04/20/2019 0.8* 0.0 - 0.2 % Final  . Neutrophils Relative % 04/20/2019 54  % Final  . Neutro Abs 04/20/2019 6.4  1.7 - 7.7 K/uL Final  . Lymphocytes Relative 04/20/2019 28  % Final  . Lymphs Abs 04/20/2019 3.4  0.7 - 4.0 K/uL Final  . Monocytes Relative 04/20/2019 11  % Final  . Monocytes Absolute 04/20/2019 1.4* 0.1 - 1.0 K/uL Final  . Eosinophils Relative 04/20/2019 3  % Final  . Eosinophils Absolute 04/20/2019 0.4  0.0 - 0.5 K/uL Final  . Basophils Relative 04/20/2019 1  % Final  . Basophils Absolute 04/20/2019 0.1  0.0 - 0.1 K/uL Final  . WBC Morphology 04/20/2019 MORPHOLOGY UNREMARKABLE   Final  . Smear Review 04/20/2019 PLATELETS APPEAR ADEQUATE   Final  . Immature Granulocytes 04/20/2019 3  % Final  . Abs Immature Granulocytes 04/20/2019 0.33* 0.00 - 0.07 K/uL Final  . Target Cells 04/20/2019 PRESENT   Final  . Giant PLTs 04/20/2019 PRESENT   Final   Performed at [facility], [address] [place], [place] [address]    STUDIES: Dg Lumbar Spine 2-3 Views  Result Date: 04/22/2019 CLINICAL DATA:  Fell 2 days ago. Low back pain. History of back surgery. EXAM: LUMBAR SPINE - 2-3 VIEW COMPARISON:  CT, 02/18/2017. FINDINGS: No fracture. Grade 1 anterolisthesis of L3 on L4. Status post posterior lumbar spine fusion at L4-L5. There is persistent grade 1 to grade 2 anterolisthesis. There is a  well-positioned intervertebral cage. The pedicle screws appear   well positioned and well-seated. No other malalignment. Moderate to marked loss of disc height at L3-L4 with endplate sclerosis. Remaining unfused discs are relatively well preserved in height. Soft tissues are unremarkable. IMPRESSION: 1. No fracture or acute finding. 2. Postsurgical changes from a posterior L4-L5 lumbar spine fusion, stable in appearance from the previous CT scan. 3. Disc degenerative changes at L2-L3 also stable from the prior CT. Electronically Signed   By: David  Ormond M.D.   On: 04/22/2019 11:33    ASSESSMENT:  Chronic refractory ITP  PLAN:   1. Chronic refractory ITP: Patient had a poor response to Prednisone, WinRho, and Rituxan. She is status post splenectomy in 2007.  Patient's platelet count is significantly improved and is now greater than 400.  This is likely from the recent IVIG and Nplate she received while at [facility].  She does not require any Nplate today.  [facility] has recommended weekly Nplate, but given this patient's comorbidities, weekly clinic visits would be difficult.  IVIG also works, but because of the visitor restrictions during COVID-19 patient likely would not do well sitting in the clinic alone all day for multiple days at a time.  Patient is currently receiving the max dose of Nplate at 10 mcg/kg.  We can consider increasing the frequency of Nplate to every 2 to 3 weeks, Promacta 12.5mg or Tavalisse 100 mg BID also could be used if needed.  Return to clinic in 2 weeks with repeat laboratory work, further evaluation, and continuation of treatment.  2. Weakness and fatigue: Chronic and unchanged. 3. Depression/dementia: Chronic and unchanged.  Patient is now at [facility]. 4.  Leg wound: Continue surgical dressings as directed. 5.  Anemia: Likely secondary to recent leg bleed.  Continue to monitor.  Patient may benefit from IV iron if hemoglobin does not improve on its own.  I spent a total of 30  minutes face-to-face with the patient of which greater than 50% of the visit was spent in counseling and coordination of care as detailed above.   Patient expressed understanding and was in agreement with this plan. She also understands that She can call clinic at any time with any questions, concerns, or complaints.    Timothy J Finnegan, MD   04/23/2019 6:26 AM        

## 2019-04-17 DIAGNOSIS — S51811D Laceration without foreign body of right forearm, subsequent encounter: Secondary | ICD-10-CM | POA: Diagnosis not present

## 2019-04-17 DIAGNOSIS — Z4800 Encounter for change or removal of nonsurgical wound dressing: Secondary | ICD-10-CM | POA: Diagnosis not present

## 2019-04-17 DIAGNOSIS — S71111D Laceration without foreign body, right thigh, subsequent encounter: Secondary | ICD-10-CM | POA: Diagnosis not present

## 2019-04-17 DIAGNOSIS — S51011D Laceration without foreign body of right elbow, subsequent encounter: Secondary | ICD-10-CM | POA: Diagnosis not present

## 2019-04-17 DIAGNOSIS — R296 Repeated falls: Secondary | ICD-10-CM | POA: Diagnosis not present

## 2019-04-17 DIAGNOSIS — D693 Immune thrombocytopenic purpura: Secondary | ICD-10-CM | POA: Diagnosis not present

## 2019-04-17 DIAGNOSIS — W19XXXD Unspecified fall, subsequent encounter: Secondary | ICD-10-CM | POA: Diagnosis not present

## 2019-04-17 DIAGNOSIS — S81811D Laceration without foreign body, right lower leg, subsequent encounter: Secondary | ICD-10-CM | POA: Diagnosis not present

## 2019-04-17 DIAGNOSIS — S51012D Laceration without foreign body of left elbow, subsequent encounter: Secondary | ICD-10-CM | POA: Diagnosis not present

## 2019-04-20 ENCOUNTER — Other Ambulatory Visit: Payer: Self-pay

## 2019-04-20 ENCOUNTER — Inpatient Hospital Stay: Payer: Medicare HMO

## 2019-04-20 ENCOUNTER — Encounter: Payer: Self-pay | Admitting: Oncology

## 2019-04-20 ENCOUNTER — Inpatient Hospital Stay (HOSPITAL_BASED_OUTPATIENT_CLINIC_OR_DEPARTMENT_OTHER): Payer: Medicare HMO | Admitting: Oncology

## 2019-04-20 ENCOUNTER — Inpatient Hospital Stay: Payer: Medicare HMO | Attending: Oncology

## 2019-04-20 VITALS — BP 122/61 | HR 60 | Temp 97.0°F | Wt 142.0 lb

## 2019-04-20 DIAGNOSIS — D649 Anemia, unspecified: Secondary | ICD-10-CM | POA: Diagnosis not present

## 2019-04-20 DIAGNOSIS — M818 Other osteoporosis without current pathological fracture: Secondary | ICD-10-CM | POA: Diagnosis not present

## 2019-04-20 DIAGNOSIS — D693 Immune thrombocytopenic purpura: Secondary | ICD-10-CM

## 2019-04-20 DIAGNOSIS — R5382 Chronic fatigue, unspecified: Secondary | ICD-10-CM | POA: Insufficient documentation

## 2019-04-20 LAB — CBC WITH DIFFERENTIAL/PLATELET
Abs Immature Granulocytes: 0.33 10*3/uL — ABNORMAL HIGH (ref 0.00–0.07)
Basophils Absolute: 0.1 10*3/uL (ref 0.0–0.1)
Basophils Relative: 1 %
Eosinophils Absolute: 0.4 10*3/uL (ref 0.0–0.5)
Eosinophils Relative: 3 %
HCT: 31.5 % — ABNORMAL LOW (ref 36.0–46.0)
Hemoglobin: 9.6 g/dL — ABNORMAL LOW (ref 12.0–15.0)
Immature Granulocytes: 3 %
Lymphocytes Relative: 28 %
Lymphs Abs: 3.4 10*3/uL (ref 0.7–4.0)
MCH: 26.8 pg (ref 26.0–34.0)
MCHC: 30.5 g/dL (ref 30.0–36.0)
MCV: 88 fL (ref 80.0–100.0)
Monocytes Absolute: 1.4 10*3/uL — ABNORMAL HIGH (ref 0.1–1.0)
Monocytes Relative: 11 %
Neutro Abs: 6.4 10*3/uL (ref 1.7–7.7)
Neutrophils Relative %: 54 %
Platelets: 448 10*3/uL — ABNORMAL HIGH (ref 150–400)
RBC: 3.58 MIL/uL — ABNORMAL LOW (ref 3.87–5.11)
Smear Review: ADEQUATE
WBC: 12 10*3/uL — ABNORMAL HIGH (ref 4.0–10.5)
nRBC: 0.8 % — ABNORMAL HIGH (ref 0.0–0.2)

## 2019-04-20 NOTE — Progress Notes (Signed)
Patient stated that she had been doing well. Patient stated that sometimes she feels tired.

## 2019-04-21 DIAGNOSIS — S51012D Laceration without foreign body of left elbow, subsequent encounter: Secondary | ICD-10-CM | POA: Diagnosis not present

## 2019-04-21 DIAGNOSIS — S71111D Laceration without foreign body, right thigh, subsequent encounter: Secondary | ICD-10-CM | POA: Diagnosis not present

## 2019-04-21 DIAGNOSIS — D693 Immune thrombocytopenic purpura: Secondary | ICD-10-CM | POA: Diagnosis not present

## 2019-04-21 DIAGNOSIS — R296 Repeated falls: Secondary | ICD-10-CM | POA: Diagnosis not present

## 2019-04-21 DIAGNOSIS — S51011D Laceration without foreign body of right elbow, subsequent encounter: Secondary | ICD-10-CM | POA: Diagnosis not present

## 2019-04-21 DIAGNOSIS — S81811D Laceration without foreign body, right lower leg, subsequent encounter: Secondary | ICD-10-CM | POA: Diagnosis not present

## 2019-04-21 DIAGNOSIS — Z4800 Encounter for change or removal of nonsurgical wound dressing: Secondary | ICD-10-CM | POA: Diagnosis not present

## 2019-04-21 DIAGNOSIS — S51811D Laceration without foreign body of right forearm, subsequent encounter: Secondary | ICD-10-CM | POA: Diagnosis not present

## 2019-04-21 DIAGNOSIS — W19XXXD Unspecified fall, subsequent encounter: Secondary | ICD-10-CM | POA: Diagnosis not present

## 2019-04-22 ENCOUNTER — Encounter: Payer: Self-pay | Admitting: Emergency Medicine

## 2019-04-22 ENCOUNTER — Other Ambulatory Visit: Payer: Self-pay

## 2019-04-22 ENCOUNTER — Emergency Department
Admission: EM | Admit: 2019-04-22 | Discharge: 2019-04-22 | Disposition: A | Discharge status: Home / Self Care | Payer: Medicare HMO | Attending: Emergency Medicine | Admitting: Emergency Medicine

## 2019-04-22 ENCOUNTER — Emergency Department: Payer: Medicare HMO

## 2019-04-22 DIAGNOSIS — Z79899 Other long term (current) drug therapy: Secondary | ICD-10-CM | POA: Diagnosis not present

## 2019-04-22 DIAGNOSIS — M545 Low back pain, unspecified: Secondary | ICD-10-CM

## 2019-04-22 DIAGNOSIS — Y9389 Activity, other specified: Secondary | ICD-10-CM | POA: Insufficient documentation

## 2019-04-22 DIAGNOSIS — J45909 Unspecified asthma, uncomplicated: Secondary | ICD-10-CM | POA: Insufficient documentation

## 2019-04-22 DIAGNOSIS — Y998 Other external cause status: Secondary | ICD-10-CM | POA: Insufficient documentation

## 2019-04-22 DIAGNOSIS — Y929 Unspecified place or not applicable: Secondary | ICD-10-CM | POA: Diagnosis not present

## 2019-04-22 DIAGNOSIS — G309 Alzheimer's disease, unspecified: Secondary | ICD-10-CM | POA: Diagnosis not present

## 2019-04-22 DIAGNOSIS — W19XXXA Unspecified fall, initial encounter: Secondary | ICD-10-CM | POA: Insufficient documentation

## 2019-04-22 MED ORDER — LIDOCAINE 5 % EX PTCH
1.0000 | MEDICATED_PATCH | CUTANEOUS | Status: DC
  Administered 2019-04-22: 1 via TRANSDERMAL
  Filled 2019-04-22: qty 1

## 2019-04-22 MED ORDER — LIDOCAINE 5 % EX PTCH: 1.0000 | MEDICATED_PATCH | Freq: Two times a day (BID) | CUTANEOUS | 0 refills | Status: DC

## 2019-04-22 MED ORDER — TRAMADOL HCL 50 MG PO TABS
50.0000 mg | ORAL_TABLET | Freq: Once | ORAL | Status: AC
  Administered 2019-04-22: 50 mg via ORAL
  Filled 2019-04-22: qty 1

## 2019-04-22 NOTE — ED Provider Notes (Signed)
[name]   ____________________________________________   First MD Initiated Contact with Patient 04/22/19 1101     (approximate)  I have reviewed the triage vital signs and the nursing notes.   HISTORY  Chief Complaint Back Pain    HPI: Via husband Ariana [name] is a 85 y.o. female patient presents with low back pain secondary to a fall 2 days ago.  Husband the patient fell and landed on her buttocks.  Patient fell to the concrete pavement.  Denies bladder bowel dysfunction.  Denies radicular component to the back pain.  I was unable to get a pain scale secondary to the patient Alzheimer's condition.  Because the patient has a history of chronic back pain.         Past Medical History:  Diagnosis Date  . Alzheimer disease ([place])   . Anemia   . Anxiety   . Asthma   . Chronic back pain   . Depression   . Depression   . GERD (gastroesophageal reflux disease)   . Hiatal hernia   . Hypercholesteremia   . ITP (idiopathic thrombocytopenic purpura)   . Osteoarthritis   . Osteoporosis   . Pulmonary emboli ([place])   . Restless legs     Patient Active Problem List   Diagnosis Date Noted  . Laceration of left lower leg 04/10/2019  . Chronic pain syndrome 01/11/2019  . OSA (obstructive sleep apnea) 09/28/2018  . Ulcer of lower limb, right, limited to breakdown of skin ([place]) 07/22/2018  . Traumatic hematoma of right lower leg 07/12/2018  . Asthma without status asthmaticus 11/25/2016  . Late onset Alzheimer's disease without behavioral disturbance ([place]) 12/30/2015  . Back muscle spasm 09/09/2015  . Clinical depression 04/12/2015  . Herpes zona 04/12/2015  . Chronic ITP (idiopathic thrombocytopenic purpura) ([facility]) 03/15/2015  . Chest pain 10/22/2014  . Breathlessness on exertion 10/22/2014  . Osteopenia 10/18/2014  . Benign essential HTN 06/28/2014  . HLD (hyperlipidemia) 06/28/2014  . Idiopathic  thrombocythemia ([place]) 06/28/2014  . Acquired hypothyroidism 06/04/2014  . Anxiety and depression 06/04/2014  . Bursitis, trochanteric 03/27/2014  . DDD (degenerative disc disease), lumbar 02/27/2014  . Neuritis or radiculitis due to rupture of lumbar intervertebral disc 02/27/2014  . Lumbar radiculitis 02/27/2014    Past Surgical History:  Procedure Laterality Date  . CARPAL TUNNEL RELEASE    . ESOPHAGOGASTRODUODENOSCOPY (EGD) WITH PROPOFOL N/A 09/10/2015   Procedure: ESOPHAGOGASTRODUODENOSCOPY (EGD) WITH PROPOFOL;  Surgeon: Lollie Sails, MD;  Location: [facility] ENDOSCOPY;  Service: Endoscopy;  Laterality: N/A;  . IRRIGATION AND DEBRIDEMENT HEMATOMA Right 07/13/2018   Procedure: IRRIGATION AND DEBRIDEMENT HEMATOMA-RIGHT SHIN;  Surgeon: Herbert Pun, MD;  Location: [facility] ORS;  Service: General;  Laterality: Right;  . LUMBAR DISC SURGERY    . SPLENECTOMY, PARTIAL    . TOTAL ABDOMINAL HYSTERECTOMY      Prior to Admission medications   Medication Sig Start Date End Date Taking? Authorizing Provider  acetaminophen (TYLENOL) 325 MG tablet Take 2 tablets (650 mg total) by mouth every 6 (six) hours as needed for mild pain (or Fever >/= 101). 07/02/15   Idelle Crouch, MD  albuterol (PROVENTIL HFA;VENTOLIN HFA) 108 (90 BASE) MCG/ACT inhaler Inhale 2 puffs into the lungs every 6 (six) hours as needed for wheezing or shortness of breath.    [provider]  ALPRAZolam Duanne Moron) 0.5 MG tablet Take 1 tablet (0.5 mg total) by mouth 3 (three) times daily as needed for anxiety. 07/15/18  Gladstone Lighter, MD  buPROPion (WELLBUTRIN XL) 150 MG 24 hr tablet Take 150 mg by mouth daily.    [provider]  busPIRone (BUSPAR) 15 MG tablet Take 1 tablet by mouth 1 day or 1 dose. 08/15/18   [provider]  Calcium Carbonate-Vit D-Min (CALCIUM 600+D PLUS MINERALS) 600-400 MG-UNIT TABS Take 1 tablet by mouth 1 day or 1 dose.    [provider]  citalopram (CELEXA) 40  MG tablet Take 40 mg by mouth daily.    [provider]  Cyanocobalamin (RA VITAMIN B-12 TR) 1000 MCG TBCR Take 1,000 mcg by mouth daily.     [provider]  donepezil (ARICEPT) 10 MG tablet Take 1 tablet by mouth at bedtime. 04/03/19 09/30/19  [provider]  fluticasone (FLONASE) 50 MCG/ACT nasal spray Place 2 sprays into both nostrils daily.  03/03/17   [provider]  Fluticasone-Salmeterol (ADVAIR) 250-50 MCG/DOSE AEPB Inhale 1 puff into the lungs daily as needed.     [provider]  gabapentin (NEURONTIN) 300 MG capsule Take 300 mg by mouth 4 (four) times daily.    [provider]  HYDROcodone-acetaminophen (NORCO) 7.5-325 MG tablet Take 1 tablet by mouth every 4 (four) hours as needed for moderate pain or severe pain. 07/15/18   Gladstone Lighter, MD  levothyroxine (SYNTHROID, LEVOTHROID) 50 MCG tablet Take 50 mcg by mouth daily before breakfast. Take 30 to 60 minutes before breakfast.    [provider]  lidocaine (LIDODERM) 5 % Place 1 patch onto the skin every 12 (twelve) hours. Remove & Discard patch within 12 hours or as directed by MD 04/22/19 04/21/20  Sable Feil, PA-C  memantine (NAMENDA) 10 MG tablet Take 1 tablet by mouth 2 (two) times a day. 04/03/19 07/02/19  [provider]  montelukast (SINGULAIR) 10 MG tablet Take 10 mg by mouth at bedtime.     [provider]  Multiple Vitamin (MULTI-VITAMIN) tablet Take 1 tablet by mouth 1 day or 1 dose.    [provider]  Multiple Vitamins-Minerals (OCUVITE-LUTEIN PO) Take 1 capsule by mouth daily.     [provider]  NARCAN 4 MG/0.1ML LIQD nasal spray kit CALL 911. ADMINISTER A SINGLE SPRAY OF NARCAN IN ONE NOSTRIL. REPEAT EVERY 3 MINUTES AS NEEDED IF NO OR MINIMAL RESPONSE. 10/24/18   [provider]  nystatin ointment (MYCOSTATIN) APPLY OINTMENT TOPICALLY TWICE DAILY 08/03/18   [provider]  oxybutynin (DITROPAN) 5 MG  tablet Take 5 mg by mouth 3 (three) times daily.    [provider]  pantoprazole (PROTONIX) 20 MG tablet Take 20 mg by mouth 2 (two) times daily.  07/24/15   [provider]  pravastatin (PRAVACHOL) 20 MG tablet Take 20 mg by mouth daily.    [provider]  predniSONE (DELTASONE) 10 MG tablet Take 10 mg by mouth daily.  01/31/18   [provider]  ranitidine (ZANTAC) 300 MG tablet Take 1 tablet by mouth at bedtime. 11/18/18 11/18/19  [provider]  SSD 1 % cream Apply topically 2 (two) times daily. 07/26/18   [provider]  traMADol (ULTRAM) 50 MG tablet Take 50 mg by mouth 2 (two) times daily as needed. 07/23/18   [provider]  triamcinolone cream (KENALOG) 0.1 % Apply topically. 09/21/18 09/21/19  [provider]  vitamin C (ASCORBIC ACID) 500 MG tablet Take 500 mg by mouth daily.    [provider]  vitamin E (E-400) 400 UNIT  capsule Take 400 Units by mouth daily.    [provider]    Allergies Aspirin, Diazepam, Morphine, Other, and Tetanus toxoids  Family History  Problem Relation Age of Onset  . Stroke Mother     Social History Social History   Tobacco Use  . Smoking status: Never Smoker  . Smokeless tobacco: Never Used  Substance Use Topics  . Alcohol use: No    Alcohol/week: 0.0 standard drinks  . Drug use: No    Review of Systems Constitutional: No fever/chills Eyes: No visual changes. ENT: No sore throat. Cardiovascular: Denies chest pain. Respiratory: Denies shortness of breath. Gastrointestinal: No abdominal pain.  No nausea, no vomiting.  No diarrhea.  No constipation. Genitourinary: Negative for dysuria. Musculoskeletal: Positive for back pain. Skin: Negative for rash. Neurological: Negative for headaches, focal weakness or numbness. Psychiatric:  Anxiety, Alzheimer's, and depression. Endocrine:  Hyperlipidemia. Allergic/Immunilogical: See allergy  list. ____________________________________________   PHYSICAL EXAM:  VITAL SIGNS: ED Triage Vitals  Enc Vitals Group     BP 04/22/19 1034 (!) 128/34     Pulse Rate 04/22/19 1034 68     Resp 04/22/19 1034 18     Temp 04/22/19 1034 98.9 F (37.2 C)     Temp Source 04/22/19 1034 Oral     SpO2 04/22/19 1034 95 %     Weight 04/22/19 1035 143 lb (64.9 kg)     Height 04/22/19 1035 _0  (1.676 m)     Head Circumference --      Peak Flow --      Pain Score --      Pain Loc --      Pain Edu? --      Excl. in [place]? --    Constitutional: Alert and oriented. Well appearing and in no acute distress. Eyes: Conjunctivae are normal. PERRL. EOMI. Head: Atraumatic. Nose: No congestion/rhinnorhea. Mouth/Throat: Mucous membranes are moist.  Oropharynx non-erythematous. Neck: No stridor.  No cervical spine tenderness to palpation. Cardiovascular: Normal rate, regular rhythm. Grossly normal heart sounds.  Good peripheral circulation. Respiratory: Normal respiratory effort.  No retractions. Lungs CTAB. Gastrointestinal: Soft and nontender. No distention. No abdominal bruits. No CVA tenderness. Genitourinary: Deferred Musculoskeletal: No obvious spinal deformity.  Patient is moderate guarding palpation of L3-S1.  No lower extremity tenderness nor edema.  No joint effusions. Neurologic:  Normal speech and language. No gross focal neurologic deficits are appreciated. No gait instability. Skin:  Skin is warm, dry and intact. No rash noted. Psychiatric: Mood and affect are normal. Speech and behavior are normal.  ____________________________________________   LABS (all labs ordered are listed, but only abnormal results are displayed)  Labs Reviewed - No data to display ____________________________________________  EKG   ____________________________________________  RADIOLOGY  ED MD interpretation:    Official radiology report(s): Dg Lumbar Spine 2-3 Views  Result Date:  04/22/2019 CLINICAL DATA:  [place] 2 days ago. Low back pain. History of back surgery. EXAM: LUMBAR SPINE - 2-3 VIEW COMPARISON:  CT, 02/18/2017. FINDINGS: No fracture. Grade 1 anterolisthesis of L3 on L4. Status post posterior lumbar spine fusion at L4-L5. There is persistent grade 1 to grade 2 anterolisthesis. There is a well-positioned intervertebral cage. The pedicle screws appear well positioned and well-seated. No other malalignment. Moderate to marked loss of disc height at L3-L4 with endplate sclerosis. Remaining unfused discs are relatively well preserved in height. Soft tissues are unremarkable. IMPRESSION: 1. No fracture or acute finding. 2. Postsurgical changes from a posterior L4-L5 lumbar spine  fusion, stable in appearance from the previous CT scan. 3. Disc degenerative changes at L2-L3 also stable from the prior CT. Electronically Signed   By: Lajean Manes M.D.   On: 04/22/2019 11:33    ____________________________________________   PROCEDURES  Procedure(s) performed (including Critical Care):  Procedures   ____________________________________________   INITIAL IMPRESSION / ASSESSMENT AND PLAN / ED COURSE  As part of my medical decision making, I reviewed the following data within the [place] was evaluated in Emergency Department on 04/22/2019 for the symptoms described in the history of present illness. She was evaluated in the context of the global COVID-19 pandemic, which necessitated consideration that the patient might be at risk for infection with the SARS-CoV-2 virus that causes COVID-19. Institutional protocols and algorithms that pertain to the evaluation of patients at risk for COVID-19 are in a state of rapid change based on information released by regulatory bodies including the CDC and federal and state organizations. These policies and algorithms were followed during the patient's care in the ED.    Patient  presents with back pain for 2 days secondary to a fall.  Patient denies radicular component to fall.  Patient is able to move lower extremities.  No bladder or bowel dysfunction.  Further evaluation of x-rays show only degenerative changes in the lumbar spine.  Discussed findings with patient and husband.  Advised supportive care and follow-up PCP.   ____________________________________________   FINAL CLINICAL IMPRESSION(S) / ED DIAGNOSES  Final diagnoses:  Acute bilateral low back pain without sciatica  Fall, initial encounter     ED Discharge Orders         Ordered    lidocaine (LIDODERM) 5 %  Every 12 hours     04/22/19 1241           Note:  This document was prepared using Dragon voice recognition software and may include unintentional dictation errors.    Sable Feil, PA-C 04/22/19 1243    Duffy Bruce, MD 04/23/19 [id number]

## 2019-04-22 NOTE — ED Triage Notes (Signed)
Fell 2 days ago and has low back pain per husband. Patient has alzheimer's and husband is with her.

## 2019-04-25 DIAGNOSIS — Z4800 Encounter for change or removal of nonsurgical wound dressing: Secondary | ICD-10-CM | POA: Diagnosis not present

## 2019-04-25 DIAGNOSIS — W19XXXD Unspecified fall, subsequent encounter: Secondary | ICD-10-CM | POA: Diagnosis not present

## 2019-04-25 DIAGNOSIS — R296 Repeated falls: Secondary | ICD-10-CM | POA: Diagnosis not present

## 2019-04-25 DIAGNOSIS — D693 Immune thrombocytopenic purpura: Secondary | ICD-10-CM | POA: Diagnosis not present

## 2019-04-25 DIAGNOSIS — S81811D Laceration without foreign body, right lower leg, subsequent encounter: Secondary | ICD-10-CM | POA: Diagnosis not present

## 2019-04-25 DIAGNOSIS — S51011D Laceration without foreign body of right elbow, subsequent encounter: Secondary | ICD-10-CM | POA: Diagnosis not present

## 2019-04-25 DIAGNOSIS — S71111D Laceration without foreign body, right thigh, subsequent encounter: Secondary | ICD-10-CM | POA: Diagnosis not present

## 2019-04-25 DIAGNOSIS — S51012D Laceration without foreign body of left elbow, subsequent encounter: Secondary | ICD-10-CM | POA: Diagnosis not present

## 2019-04-25 DIAGNOSIS — S51811D Laceration without foreign body of right forearm, subsequent encounter: Secondary | ICD-10-CM | POA: Diagnosis not present

## 2019-04-26 ENCOUNTER — Other Ambulatory Visit: Payer: Self-pay | Admitting: Family Medicine

## 2019-04-26 DIAGNOSIS — M5136 Other intervertebral disc degeneration, lumbar region: Secondary | ICD-10-CM | POA: Diagnosis not present

## 2019-04-26 DIAGNOSIS — M545 Low back pain, unspecified: Secondary | ICD-10-CM

## 2019-04-27 ENCOUNTER — Ambulatory Visit: Payer: Medicare HMO

## 2019-04-27 ENCOUNTER — Other Ambulatory Visit: Payer: Self-pay

## 2019-04-27 ENCOUNTER — Ambulatory Visit
Admission: RE | Admit: 2019-04-27 | Discharge: 2019-04-27 | Disposition: A | Discharge status: Home / Self Care | Payer: Medicare HMO | Source: Ambulatory Visit | Attending: Family Medicine | Admitting: Family Medicine

## 2019-04-27 DIAGNOSIS — M5126 Other intervertebral disc displacement, lumbar region: Secondary | ICD-10-CM | POA: Diagnosis not present

## 2019-04-27 DIAGNOSIS — M4316 Spondylolisthesis, lumbar region: Secondary | ICD-10-CM | POA: Diagnosis not present

## 2019-04-27 DIAGNOSIS — S32019A Unspecified fracture of first lumbar vertebra, initial encounter for closed fracture: Secondary | ICD-10-CM | POA: Diagnosis not present

## 2019-04-27 DIAGNOSIS — J4520 Mild intermittent asthma, uncomplicated: Secondary | ICD-10-CM | POA: Diagnosis not present

## 2019-04-27 DIAGNOSIS — G4733 Obstructive sleep apnea (adult) (pediatric): Secondary | ICD-10-CM | POA: Diagnosis not present

## 2019-04-27 DIAGNOSIS — M545 Low back pain, unspecified: Secondary | ICD-10-CM

## 2019-04-27 DIAGNOSIS — F0390 Unspecified dementia without behavioral disturbance: Secondary | ICD-10-CM | POA: Diagnosis not present

## 2019-04-27 DIAGNOSIS — M4326 Fusion of spine, lumbar region: Secondary | ICD-10-CM | POA: Diagnosis not present

## 2019-04-27 DIAGNOSIS — M5136 Other intervertebral disc degeneration, lumbar region: Secondary | ICD-10-CM | POA: Diagnosis not present

## 2019-04-28 ENCOUNTER — Other Ambulatory Visit (HOSPITAL_COMMUNITY): Payer: Self-pay | Admitting: Orthopedic Surgery

## 2019-04-28 ENCOUNTER — Other Ambulatory Visit: Payer: Self-pay | Admitting: Orthopedic Surgery

## 2019-04-28 ENCOUNTER — Ambulatory Visit
Admission: RE | Admit: 2019-04-28 | Discharge: 2019-04-28 | Disposition: A | Discharge status: Home / Self Care | Payer: Medicare HMO | Source: Ambulatory Visit | Attending: Orthopedic Surgery | Admitting: Orthopedic Surgery

## 2019-04-28 ENCOUNTER — Other Ambulatory Visit
Admission: RE | Admit: 2019-04-28 | Discharge: 2019-04-28 | Disposition: A | Discharge status: Home / Self Care | Payer: Medicare HMO | Source: Ambulatory Visit | Attending: Orthopedic Surgery | Admitting: Orthopedic Surgery

## 2019-04-28 DIAGNOSIS — R609 Edema, unspecified: Secondary | ICD-10-CM | POA: Diagnosis not present

## 2019-04-28 DIAGNOSIS — M47816 Spondylosis without myelopathy or radiculopathy, lumbar region: Secondary | ICD-10-CM | POA: Insufficient documentation

## 2019-04-28 DIAGNOSIS — W010XXA Fall on same level from slipping, tripping and stumbling without subsequent striking against object, initial encounter: Secondary | ICD-10-CM | POA: Diagnosis not present

## 2019-04-28 DIAGNOSIS — S32010A Wedge compression fracture of first lumbar vertebra, initial encounter for closed fracture: Secondary | ICD-10-CM | POA: Insufficient documentation

## 2019-04-28 DIAGNOSIS — Z1159 Encounter for screening for other viral diseases: Secondary | ICD-10-CM | POA: Diagnosis not present

## 2019-04-28 DIAGNOSIS — S32019A Unspecified fracture of first lumbar vertebra, initial encounter for closed fracture: Secondary | ICD-10-CM | POA: Diagnosis not present

## 2019-04-28 DIAGNOSIS — X58XXXA Exposure to other specified factors, initial encounter: Secondary | ICD-10-CM | POA: Insufficient documentation

## 2019-04-28 DIAGNOSIS — F4489 Other dissociative and conversion disorders: Secondary | ICD-10-CM | POA: Diagnosis not present

## 2019-04-28 DIAGNOSIS — M545 Low back pain: Secondary | ICD-10-CM | POA: Diagnosis not present

## 2019-04-28 DIAGNOSIS — R4182 Altered mental status, unspecified: Secondary | ICD-10-CM | POA: Diagnosis not present

## 2019-04-28 DIAGNOSIS — M5136 Other intervertebral disc degeneration, lumbar region: Secondary | ICD-10-CM | POA: Diagnosis not present

## 2019-04-28 LAB — SARS CORONAVIRUS 2 (TAT 6-24 HRS): SARS Coronavirus 2: NEGATIVE

## 2019-05-01 ENCOUNTER — Other Ambulatory Visit: Payer: Self-pay

## 2019-05-01 ENCOUNTER — Inpatient Hospital Stay: Payer: Medicare HMO

## 2019-05-01 DIAGNOSIS — D693 Immune thrombocytopenic purpura: Secondary | ICD-10-CM

## 2019-05-01 DIAGNOSIS — D649 Anemia, unspecified: Secondary | ICD-10-CM | POA: Diagnosis not present

## 2019-05-01 DIAGNOSIS — M818 Other osteoporosis without current pathological fracture: Secondary | ICD-10-CM | POA: Diagnosis not present

## 2019-05-01 DIAGNOSIS — R5382 Chronic fatigue, unspecified: Secondary | ICD-10-CM | POA: Diagnosis not present

## 2019-05-01 LAB — CBC WITH DIFFERENTIAL/PLATELET
Abs Immature Granulocytes: 0.02 10*3/uL (ref 0.00–0.07)
Basophils Absolute: 0.1 10*3/uL (ref 0.0–0.1)
Basophils Relative: 1 %
Eosinophils Absolute: 0.2 10*3/uL (ref 0.0–0.5)
Eosinophils Relative: 3 %
HCT: 35 % — ABNORMAL LOW (ref 36.0–46.0)
Hemoglobin: 10.8 g/dL — ABNORMAL LOW (ref 12.0–15.0)
Immature Granulocytes: 0 %
Lymphocytes Relative: 32 %
Lymphs Abs: 2.4 10*3/uL (ref 0.7–4.0)
MCH: 28 pg (ref 26.0–34.0)
MCHC: 30.9 g/dL (ref 30.0–36.0)
MCV: 90.7 fL (ref 80.0–100.0)
Monocytes Absolute: 0.6 10*3/uL (ref 0.1–1.0)
Monocytes Relative: 9 %
Neutro Abs: 4.1 10*3/uL (ref 1.7–7.7)
Neutrophils Relative %: 55 %
Platelets: 117 10*3/uL — ABNORMAL LOW (ref 150–400)
RBC: 3.86 MIL/uL — ABNORMAL LOW (ref 3.87–5.11)
RDW: 23.9 % — ABNORMAL HIGH (ref 11.5–15.5)
WBC: 7.4 10*3/uL (ref 4.0–10.5)
nRBC: 0 % (ref 0.0–0.2)

## 2019-05-01 MED ORDER — CEFAZOLIN SODIUM-DEXTROSE 2-4 GM/100ML-% IV SOLN
2.0000 g | Freq: Once | INTRAVENOUS | Status: AC
  Administered 2019-05-02: 12:00:00 2 g via INTRAVENOUS

## 2019-05-01 MED ORDER — ROMIPLOSTIM INJECTION 500 MCG: 10.0000 ug/kg | Freq: Once | SUBCUTANEOUS | Status: DC

## 2019-05-02 ENCOUNTER — Ambulatory Visit
Admission: RE | Admit: 2019-05-02 | Discharge: 2019-05-02 | Disposition: A | Discharge status: Home / Self Care | Payer: Medicare HMO | Attending: Orthopedic Surgery | Admitting: Orthopedic Surgery

## 2019-05-02 ENCOUNTER — Inpatient Hospital Stay: Payer: Medicare HMO

## 2019-05-02 ENCOUNTER — Encounter
Admission: RE | Disposition: A | Discharge status: Home / Self Care | Payer: Self-pay | Source: Home / Self Care | Attending: Orthopedic Surgery

## 2019-05-02 ENCOUNTER — Ambulatory Visit: Payer: Medicare HMO

## 2019-05-02 ENCOUNTER — Encounter: Payer: Self-pay | Admitting: *Deleted

## 2019-05-02 ENCOUNTER — Ambulatory Visit: Payer: Medicare HMO | Admitting: Anesthesiology

## 2019-05-02 DIAGNOSIS — G4730 Sleep apnea, unspecified: Secondary | ICD-10-CM | POA: Diagnosis not present

## 2019-05-02 DIAGNOSIS — I10 Essential (primary) hypertension: Secondary | ICD-10-CM | POA: Diagnosis not present

## 2019-05-02 DIAGNOSIS — M810 Age-related osteoporosis without current pathological fracture: Secondary | ICD-10-CM | POA: Diagnosis not present

## 2019-05-02 DIAGNOSIS — Z7951 Long term (current) use of inhaled steroids: Secondary | ICD-10-CM | POA: Insufficient documentation

## 2019-05-02 DIAGNOSIS — F329 Major depressive disorder, single episode, unspecified: Secondary | ICD-10-CM | POA: Diagnosis not present

## 2019-05-02 DIAGNOSIS — E039 Hypothyroidism, unspecified: Secondary | ICD-10-CM | POA: Diagnosis not present

## 2019-05-02 DIAGNOSIS — E7800 Pure hypercholesterolemia, unspecified: Secondary | ICD-10-CM | POA: Diagnosis not present

## 2019-05-02 DIAGNOSIS — W19XXXA Unspecified fall, initial encounter: Secondary | ICD-10-CM | POA: Insufficient documentation

## 2019-05-02 DIAGNOSIS — G309 Alzheimer's disease, unspecified: Secondary | ICD-10-CM | POA: Diagnosis not present

## 2019-05-02 DIAGNOSIS — Z79899 Other long term (current) drug therapy: Secondary | ICD-10-CM | POA: Insufficient documentation

## 2019-05-02 DIAGNOSIS — Z981 Arthrodesis status: Secondary | ICD-10-CM | POA: Diagnosis not present

## 2019-05-02 DIAGNOSIS — Z86711 Personal history of pulmonary embolism: Secondary | ICD-10-CM | POA: Diagnosis not present

## 2019-05-02 DIAGNOSIS — K219 Gastro-esophageal reflux disease without esophagitis: Secondary | ICD-10-CM | POA: Diagnosis not present

## 2019-05-02 DIAGNOSIS — S32010A Wedge compression fracture of first lumbar vertebra, initial encounter for closed fracture: Secondary | ICD-10-CM | POA: Diagnosis not present

## 2019-05-02 DIAGNOSIS — F0280 Dementia in other diseases classified elsewhere without behavioral disturbance: Secondary | ICD-10-CM | POA: Diagnosis not present

## 2019-05-02 DIAGNOSIS — D693 Immune thrombocytopenic purpura: Secondary | ICD-10-CM | POA: Diagnosis not present

## 2019-05-02 DIAGNOSIS — Z79890 Hormone replacement therapy (postmenopausal): Secondary | ICD-10-CM | POA: Diagnosis not present

## 2019-05-02 DIAGNOSIS — Z419 Encounter for procedure for purposes other than remedying health state, unspecified: Secondary | ICD-10-CM

## 2019-05-02 DIAGNOSIS — F419 Anxiety disorder, unspecified: Secondary | ICD-10-CM | POA: Diagnosis not present

## 2019-05-02 HISTORY — PX: KYPHOPLASTY: SHX5884

## 2019-05-02 SURGERY — KYPHOPLASTY
Anesthesia: General

## 2019-05-02 MED ORDER — FENTANYL CITRATE (PF) 100 MCG/2ML IJ SOLN
INTRAMUSCULAR | Status: AC
  Filled 2019-05-02: qty 2

## 2019-05-02 MED ORDER — METOCLOPRAMIDE HCL 5 MG/ML IJ SOLN: 5.0000 mg | Freq: Three times a day (TID) | INTRAMUSCULAR | Status: DC | PRN

## 2019-05-02 MED ORDER — FENTANYL CITRATE (PF) 100 MCG/2ML IJ SOLN
INTRAMUSCULAR | Status: AC
  Administered 2019-05-02: 25 ug via INTRAVENOUS
  Filled 2019-05-02: qty 2

## 2019-05-02 MED ORDER — PROPOFOL 10 MG/ML IV BOLUS
INTRAVENOUS | Status: DC | PRN
  Administered 2019-05-02 (×2): 30 mg via INTRAVENOUS

## 2019-05-02 MED ORDER — HYDROCODONE-ACETAMINOPHEN 7.5-325 MG PO TABS
1.0000 | ORAL_TABLET | ORAL | Status: DC | PRN
  Filled 2019-05-02: qty 2

## 2019-05-02 MED ORDER — SODIUM CHLORIDE 0.9 % IV SOLN: INTRAVENOUS | Status: DC

## 2019-05-02 MED ORDER — ONDANSETRON HCL 4 MG/2ML IJ SOLN: 4.0000 mg | Freq: Four times a day (QID) | INTRAMUSCULAR | Status: DC | PRN

## 2019-05-02 MED ORDER — FENTANYL CITRATE (PF) 100 MCG/2ML IJ SOLN
INTRAMUSCULAR | Status: DC | PRN
  Administered 2019-05-02 (×4): 25 ug via INTRAVENOUS

## 2019-05-02 MED ORDER — PROPOFOL 500 MG/50ML IV EMUL
INTRAVENOUS | Status: DC | PRN
  Administered 2019-05-02: 100 ug/kg/min via INTRAVENOUS

## 2019-05-02 MED ORDER — METOCLOPRAMIDE HCL 10 MG PO TABS: 5.0000 mg | ORAL_TABLET | Freq: Three times a day (TID) | ORAL | Status: DC | PRN

## 2019-05-02 MED ORDER — LACTATED RINGERS IV SOLN
INTRAVENOUS | Status: DC
  Administered 2019-05-02: 10:00:00 via INTRAVENOUS

## 2019-05-02 MED ORDER — ONDANSETRON HCL 4 MG PO TABS: 4.0000 mg | ORAL_TABLET | Freq: Four times a day (QID) | ORAL | Status: DC | PRN

## 2019-05-02 MED ORDER — ACETAMINOPHEN 500 MG PO TABS: 500.0000 mg | ORAL_TABLET | Freq: Four times a day (QID) | ORAL | Status: DC

## 2019-05-02 MED ORDER — ONDANSETRON HCL 4 MG/2ML IJ SOLN: 4.0000 mg | Freq: Once | INTRAMUSCULAR | Status: DC | PRN

## 2019-05-02 MED ORDER — BUPIVACAINE-EPINEPHRINE (PF) 0.5% -1:200000 IJ SOLN
INTRAMUSCULAR | Status: DC | PRN
  Administered 2019-05-02: 10 mL via PERINEURAL

## 2019-05-02 MED ORDER — HYDROCODONE-ACETAMINOPHEN 5-325 MG PO TABS: 1.0000 | ORAL_TABLET | Freq: Four times a day (QID) | ORAL | 0 refills | Status: DC | PRN

## 2019-05-02 MED ORDER — CEFAZOLIN SODIUM-DEXTROSE 2-4 GM/100ML-% IV SOLN
INTRAVENOUS | Status: AC
  Filled 2019-05-02: qty 100

## 2019-05-02 MED ORDER — LIDOCAINE HCL 1 % IJ SOLN
INTRAMUSCULAR | Status: DC | PRN
  Administered 2019-05-02 (×2): 10 mL

## 2019-05-02 MED ORDER — HYDROCODONE-ACETAMINOPHEN 5-325 MG PO TABS: 1.0000 | ORAL_TABLET | ORAL | Status: DC | PRN

## 2019-05-02 MED ORDER — ACETAMINOPHEN 325 MG PO TABS: 325.0000 mg | ORAL_TABLET | Freq: Four times a day (QID) | ORAL | Status: DC | PRN

## 2019-05-02 MED ORDER — LIDOCAINE HCL (CARDIAC) PF 100 MG/5ML IV SOSY
PREFILLED_SYRINGE | INTRAVENOUS | Status: DC | PRN
  Administered 2019-05-02: 80 mg via INTRAVENOUS

## 2019-05-02 MED ORDER — ONDANSETRON HCL 4 MG/2ML IJ SOLN
INTRAMUSCULAR | Status: DC | PRN
  Administered 2019-05-02: 4 mg via INTRAVENOUS

## 2019-05-02 MED ORDER — FENTANYL CITRATE (PF) 100 MCG/2ML IJ SOLN
25.0000 ug | INTRAMUSCULAR | Status: DC | PRN
  Administered 2019-05-02 (×5): 25 ug via INTRAVENOUS

## 2019-05-02 SURGICAL SUPPLY — 20 items
ADH SKN CLS APL DERMABOND .7 (GAUZE/BANDAGES/DRESSINGS) ×1
CEMENT KYPHON CX01A KIT/MIXER (Cement) ×2 IMPLANT
COVER WAND RF STERILE (DRAPES) ×2 IMPLANT
DERMABOND ADVANCED (GAUZE/BANDAGES/DRESSINGS) ×1
DERMABOND ADVANCED .7 DNX12 (GAUZE/BANDAGES/DRESSINGS) ×1 IMPLANT
DEVICE BIOPSY BONE KYPHX (INSTRUMENTS) ×2 IMPLANT
DRAPE C-ARM XRAY 36X54 (DRAPES) ×2 IMPLANT
DURAPREP 26ML APPLICATOR (WOUND CARE) ×2 IMPLANT
FEE RENTAL RFA GENERATOR (MISCELLANEOUS) IMPLANT
GLOVE SURG SYN 9.0  PF PI (GLOVE) ×1
GLOVE SURG SYN 9.0 PF PI (GLOVE) ×1 IMPLANT
GOWN SRG 2XL LVL 4 RGLN SLV (GOWNS) ×1 IMPLANT
GOWN STRL NON-REIN 2XL LVL4 (GOWNS) ×2
GOWN STRL REUS W/ TWL LRG LVL3 (GOWN DISPOSABLE) ×1 IMPLANT
GOWN STRL REUS W/TWL LRG LVL3 (GOWN DISPOSABLE) ×2
PACK KYPHOPLASTY (MISCELLANEOUS) ×2 IMPLANT
RENTAL RFA GENERATOR (MISCELLANEOUS) IMPLANT
STRAP SAFETY 5IN WIDE (MISCELLANEOUS) ×2 IMPLANT
TRAY KYPHOPAK 15/3 EXPRESS 1ST (MISCELLANEOUS) ×2 IMPLANT
TRAY KYPHOPAK 20/3 EXPRESS 1ST (MISCELLANEOUS) ×2 IMPLANT

## 2019-05-02 NOTE — Anesthesia Preprocedure Evaluation (Signed)
Anesthesia Evaluation  Patient identified by MRN, date of birth, ID band Patient awake    Reviewed: Allergy & Precautions, NPO status , Patient's Chart, lab work & pertinent test results  History of Anesthesia Complications Negative for: history of anesthetic complications  Airway Mallampati: II  TM Distance: >3 FB Neck ROM: Full    Dental no notable dental hx.    Pulmonary asthma , sleep apnea ,    breath sounds clear to auscultation- rhonchi (-) wheezing      Cardiovascular hypertension, (-) CAD, (-) Past MI, (-) Cardiac Stents and (-) CABG  Rhythm:Regular Rate:Normal - Systolic murmurs and - Diastolic murmurs    Neuro/Psych neg Seizures PSYCHIATRIC DISORDERS Anxiety Depression Dementia negative neurological ROS     GI/Hepatic Neg liver ROS, hiatal hernia, GERD  ,  Endo/Other  Hypothyroidism   Renal/GU negative Renal ROS     Musculoskeletal  (+) Arthritis ,   Abdominal (+) - obese,   Peds  Hematology  (+) anemia ,   Anesthesia Other Findings Past Medical History: No date: Alzheimer disease ([place]) No date: Anemia No date: Anxiety No date: Asthma No date: Chronic back pain No date: Depression No date: Depression No date: GERD (gastroesophageal reflux disease) No date: Hiatal hernia No date: Hypercholesteremia No date: ITP (idiopathic thrombocytopenic purpura) No date: Osteoarthritis No date: Osteoporosis No date: Pulmonary emboli (HCC) No date: Restless legs   Reproductive/Obstetrics                             Anesthesia Physical Anesthesia Plan  ASA: II  Anesthesia Plan: General   Post-op Pain Management:    Induction: Intravenous  PONV Risk Score and Plan: 2 and Propofol infusion  Airway Management Planned: Natural Airway  Additional Equipment:   Intra-op Plan:   Post-operative Plan:   Informed Consent: I have reviewed the patients History and Physical, chart,  labs and discussed the procedure including the risks, benefits and alternatives for the proposed anesthesia with the patient or authorized representative who has indicated his/her understanding and acceptance.     Dental advisory given  Plan Discussed with: CRNA and Anesthesiologist  Anesthesia Plan Comments:         Anesthesia Quick Evaluation

## 2019-05-02 NOTE — Progress Notes (Signed)
Incontinent of urine

## 2019-05-02 NOTE — H&P (Signed)
Reviewed paper H+P, will be scanned into chart. No changes noted. MRI noted only L1 fracture.

## 2019-05-02 NOTE — Op Note (Signed)
Date May 02, 2019  time 12:15 PM   PATIENT:  Ariana [name]   PRE-OPERATIVE DIAGNOSIS:  closed wedge compression fracture of L1   POST-OPERATIVE DIAGNOSIS:  closed wedge compression fracture of L1   PROCEDURE:  Procedure(s): KYPHOPLASTY L1  SURGEON: Laurene Footman, MD   ASSISTANTS: None   ANESTHESIA:   local and MAC   EBL:  No intake/output data recorded.   BLOOD ADMINISTERED:none   DRAINS: none    LOCAL MEDICATIONS USED:  MARCAINE    and XYLOCAINE    SPECIMEN:   L1 vertebral body biopsy   DISPOSITION OF SPECIMEN:  Pathology   COUNTS:  YES   TOURNIQUET:  * No tourniquets in log *   IMPLANTS: Bone cement   DICTATION: .Dragon Dictation  patient was brought to the operating room and after adequate anesthesia was obtained the patient was placed prone.  C arm was brought in in good visualization of the affected level obtained on both AP and lateral projections.  After patient identification and timeout procedures were completed, local anesthetic was infiltrated with 10 cc 1% Xylocaine infiltrated subcutaneously.  This is done the area on the right side of the planned approach.  The back was then prepped and draped in the usual sterile manner and repeat timeout procedure carried out.  A spinal needle was brought down to the pedicle on the right side of  L1 and a 50-50 mix of 1% Xylocaine half percent Sensorcaine with epinephrine total of 20 cc injected.  After allowing this to set a small incision was made and the trocar was advanced into the vertebral body in an extrapedicular fashion.  Biopsy was obtained Drilling was carried out balloon inserted with inflation to  for cc.  When the cement was appropriate consistency 6 cc were injected into the vertebral body without extravasation, good fill superior to inferior endplates and from right to left sides along the inferior endplate.  After the cement had set the trochar was removed and permanent C-arm views obtained.  The wound was  closed with Dermabond followed by Band-Aid   PLAN OF CARE: Discharge to home after PACU   PATIENT DISPOSITION:  PACU - hemodynamically stable.

## 2019-05-02 NOTE — Anesthesia Post-op Follow-up Note (Signed)
Anesthesia QCDR form completed.        

## 2019-05-02 NOTE — Transfer of Care (Signed)
Immediate Anesthesia Transfer of Care Note  Patient: Nadine Ryle  Procedure(s) Performed: L1 KYPHOPLASTY (N/A )  Patient Location: PACU  Anesthesia Type:General  Level of Consciousness: awake, drowsy and patient cooperative  Airway & Oxygen Therapy: Patient Spontanous Breathing and Patient connected to nasal cannula oxygen  Post-op Assessment: Report given to RN and Post -op Vital signs reviewed and stable  Post vital signs: Reviewed and stable  Last Vitals:  Vitals Value Taken Time  BP 104/54 05/02/19 1224  Temp    Pulse 73 05/02/19 1226  Resp 14 05/02/19 1226  SpO2 100 % 05/02/19 1226  Vitals shown include unvalidated device data.  Last Pain:  Vitals:   05/02/19 1009  TempSrc: Oral  PainSc: 9          Complications: No apparent anesthesia complications

## 2019-05-02 NOTE — Discharge Instructions (Signed)
Eye Surgery Discharge Instructions    Expect mild scratchy sensation or mild soreness. DO NOT RUB YOUR EYE!  The day of surgery:  Minimal physical activity, but bed rest is not required  No reading, computer work, or close hand work  No bending, lifting, or straining.  May watch TV  For 24 hours:  No driving, legal decisions, or alcoholic beverages  Safety precautions  Eat anything you prefer: It is better to start with liquids, then soup then solid foods.  _____ Eye patch should be worn until postoperative exam tomorrow.  ____ Solar shield eyeglasses should be worn for comfort in the sunlight/patch while sleeping  Resume all regular medications including aspirin or Coumadin if these were discontinued prior to surgery. You may shower, bathe, shave, or wash your hair. Tylenol may be taken for mild discomfort.  Call your doctor if you experience significant pain, nausea, or vomiting, fever > 101 or other signs of infection. [phone] or [phone] Specific instructions:  Follow-up Information    Hessie Knows, MD Follow up.   Specialty: Orthopedic Surgery Why: May 19, 2019 @ 10:00 am  Contact information: [place] [address] [phone]          AMBULATORY SURGERY  DISCHARGE INSTRUCTIONS   1) The drugs that you were given will stay in your system until tomorrow so for the next 24 hours you should not:  A) Drive an automobile B) Make any legal decisions C) Drink any alcoholic beverage   2) You may resume regular meals tomorrow.  Today it is better to start with liquids and gradually work up to solid foods.  You may eat anything you prefer, but it is better to start with liquids, then soup and crackers, and gradually work up to solid foods.   3) Please notify your doctor immediately if you have any unusual bleeding, trouble breathing, redness and pain at the surgery site, drainage, fever, or pain  not relieved by medication. 4)   5) Your post-operative visit with Dr.                                     is: Date:                        Time:    Please call to schedule your post-operative visit.  6) Additional Instructions:    Take it easy today and tomorrow and resume more normal activities on Thursday.  Remove Band-Aids on Thursday.  Then okay to shower.  Try not to lift anything over 5 pounds for 2 weeks.  Pain medicine as directed.

## 2019-05-02 NOTE — Anesthesia Postprocedure Evaluation (Signed)
Anesthesia Post Note  Patient: Ariana [name]  Procedure(s) Performed: L1 KYPHOPLASTY (N/A )  Patient location during evaluation: PACU Anesthesia Type: General Level of consciousness: awake and alert and oriented Pain management: pain level controlled Vital Signs Assessment: post-procedure vital signs reviewed and stable Respiratory status: spontaneous breathing, nonlabored ventilation and respiratory function stable Cardiovascular status: blood pressure returned to baseline and stable Postop Assessment: no signs of nausea or vomiting Anesthetic complications: no     Last Vitals:  Vitals:   05/02/19 1325 05/02/19 1345  BP: 138/68 (!) 169/70  Pulse: 62 61  Resp: 19 16  Temp:  (!) 36.3 C  SpO2: 100% 98%    Last Pain:  Vitals:   05/02/19 1345  TempSrc: Oral  PainSc: 3                  Shenicka Sunderlin

## 2019-05-03 DIAGNOSIS — D693 Immune thrombocytopenic purpura: Secondary | ICD-10-CM | POA: Diagnosis not present

## 2019-05-03 DIAGNOSIS — W19XXXD Unspecified fall, subsequent encounter: Secondary | ICD-10-CM | POA: Diagnosis not present

## 2019-05-03 DIAGNOSIS — S51011D Laceration without foreign body of right elbow, subsequent encounter: Secondary | ICD-10-CM | POA: Diagnosis not present

## 2019-05-03 DIAGNOSIS — Z4800 Encounter for change or removal of nonsurgical wound dressing: Secondary | ICD-10-CM | POA: Diagnosis not present

## 2019-05-03 DIAGNOSIS — S81811D Laceration without foreign body, right lower leg, subsequent encounter: Secondary | ICD-10-CM | POA: Diagnosis not present

## 2019-05-03 DIAGNOSIS — R296 Repeated falls: Secondary | ICD-10-CM | POA: Diagnosis not present

## 2019-05-03 DIAGNOSIS — S71111D Laceration without foreign body, right thigh, subsequent encounter: Secondary | ICD-10-CM | POA: Diagnosis not present

## 2019-05-03 DIAGNOSIS — S51012D Laceration without foreign body of left elbow, subsequent encounter: Secondary | ICD-10-CM | POA: Diagnosis not present

## 2019-05-03 DIAGNOSIS — S51811D Laceration without foreign body of right forearm, subsequent encounter: Secondary | ICD-10-CM | POA: Diagnosis not present

## 2019-05-04 ENCOUNTER — Ambulatory Visit: Payer: Medicare HMO

## 2019-05-04 ENCOUNTER — Other Ambulatory Visit: Payer: Medicare HMO

## 2019-05-04 LAB — SURGICAL PATHOLOGY

## 2019-05-05 DIAGNOSIS — S51011D Laceration without foreign body of right elbow, subsequent encounter: Secondary | ICD-10-CM | POA: Diagnosis not present

## 2019-05-05 DIAGNOSIS — R296 Repeated falls: Secondary | ICD-10-CM | POA: Diagnosis not present

## 2019-05-05 DIAGNOSIS — S51811D Laceration without foreign body of right forearm, subsequent encounter: Secondary | ICD-10-CM | POA: Diagnosis not present

## 2019-05-05 DIAGNOSIS — W19XXXD Unspecified fall, subsequent encounter: Secondary | ICD-10-CM | POA: Diagnosis not present

## 2019-05-05 DIAGNOSIS — S81811D Laceration without foreign body, right lower leg, subsequent encounter: Secondary | ICD-10-CM | POA: Diagnosis not present

## 2019-05-05 DIAGNOSIS — D693 Immune thrombocytopenic purpura: Secondary | ICD-10-CM | POA: Diagnosis not present

## 2019-05-05 DIAGNOSIS — S71111D Laceration without foreign body, right thigh, subsequent encounter: Secondary | ICD-10-CM | POA: Diagnosis not present

## 2019-05-05 DIAGNOSIS — Z4800 Encounter for change or removal of nonsurgical wound dressing: Secondary | ICD-10-CM | POA: Diagnosis not present

## 2019-05-05 DIAGNOSIS — S51012D Laceration without foreign body of left elbow, subsequent encounter: Secondary | ICD-10-CM | POA: Diagnosis not present

## 2019-05-08 DIAGNOSIS — S51811D Laceration without foreign body of right forearm, subsequent encounter: Secondary | ICD-10-CM | POA: Diagnosis not present

## 2019-05-08 DIAGNOSIS — S71111D Laceration without foreign body, right thigh, subsequent encounter: Secondary | ICD-10-CM | POA: Diagnosis not present

## 2019-05-08 DIAGNOSIS — S51011D Laceration without foreign body of right elbow, subsequent encounter: Secondary | ICD-10-CM | POA: Diagnosis not present

## 2019-05-08 DIAGNOSIS — R296 Repeated falls: Secondary | ICD-10-CM | POA: Diagnosis not present

## 2019-05-08 DIAGNOSIS — S81811D Laceration without foreign body, right lower leg, subsequent encounter: Secondary | ICD-10-CM | POA: Diagnosis not present

## 2019-05-08 DIAGNOSIS — D693 Immune thrombocytopenic purpura: Secondary | ICD-10-CM | POA: Diagnosis not present

## 2019-05-08 DIAGNOSIS — Z4800 Encounter for change or removal of nonsurgical wound dressing: Secondary | ICD-10-CM | POA: Diagnosis not present

## 2019-05-08 DIAGNOSIS — S51012D Laceration without foreign body of left elbow, subsequent encounter: Secondary | ICD-10-CM | POA: Diagnosis not present

## 2019-05-08 DIAGNOSIS — W19XXXD Unspecified fall, subsequent encounter: Secondary | ICD-10-CM | POA: Diagnosis not present

## 2019-05-09 ENCOUNTER — Other Ambulatory Visit: Payer: Self-pay | Admitting: Orthopedic Surgery

## 2019-05-09 DIAGNOSIS — M5442 Lumbago with sciatica, left side: Secondary | ICD-10-CM

## 2019-05-09 DIAGNOSIS — G8929 Other chronic pain: Secondary | ICD-10-CM

## 2019-05-10 DIAGNOSIS — Z4800 Encounter for change or removal of nonsurgical wound dressing: Secondary | ICD-10-CM | POA: Diagnosis not present

## 2019-05-10 DIAGNOSIS — S51011D Laceration without foreign body of right elbow, subsequent encounter: Secondary | ICD-10-CM | POA: Diagnosis not present

## 2019-05-10 DIAGNOSIS — D693 Immune thrombocytopenic purpura: Secondary | ICD-10-CM | POA: Diagnosis not present

## 2019-05-10 DIAGNOSIS — S51811D Laceration without foreign body of right forearm, subsequent encounter: Secondary | ICD-10-CM | POA: Diagnosis not present

## 2019-05-10 DIAGNOSIS — R296 Repeated falls: Secondary | ICD-10-CM | POA: Diagnosis not present

## 2019-05-10 DIAGNOSIS — S81811D Laceration without foreign body, right lower leg, subsequent encounter: Secondary | ICD-10-CM | POA: Diagnosis not present

## 2019-05-10 DIAGNOSIS — W19XXXD Unspecified fall, subsequent encounter: Secondary | ICD-10-CM | POA: Diagnosis not present

## 2019-05-10 DIAGNOSIS — S51012D Laceration without foreign body of left elbow, subsequent encounter: Secondary | ICD-10-CM | POA: Diagnosis not present

## 2019-05-10 DIAGNOSIS — S71111D Laceration without foreign body, right thigh, subsequent encounter: Secondary | ICD-10-CM | POA: Diagnosis not present

## 2019-05-11 ENCOUNTER — Other Ambulatory Visit: Payer: Self-pay

## 2019-05-11 ENCOUNTER — Ambulatory Visit
Admission: RE | Admit: 2019-05-11 | Discharge: 2019-05-11 | Disposition: A | Discharge status: Home / Self Care | Payer: Medicare HMO | Source: Ambulatory Visit | Attending: Orthopedic Surgery | Admitting: Orthopedic Surgery

## 2019-05-11 DIAGNOSIS — M5441 Lumbago with sciatica, right side: Secondary | ICD-10-CM | POA: Diagnosis not present

## 2019-05-11 DIAGNOSIS — M545 Low back pain: Secondary | ICD-10-CM | POA: Diagnosis not present

## 2019-05-11 DIAGNOSIS — S71111D Laceration without foreign body, right thigh, subsequent encounter: Secondary | ICD-10-CM | POA: Diagnosis not present

## 2019-05-11 DIAGNOSIS — M5442 Lumbago with sciatica, left side: Secondary | ICD-10-CM | POA: Diagnosis not present

## 2019-05-11 DIAGNOSIS — S51011D Laceration without foreign body of right elbow, subsequent encounter: Secondary | ICD-10-CM | POA: Diagnosis not present

## 2019-05-11 DIAGNOSIS — W19XXXD Unspecified fall, subsequent encounter: Secondary | ICD-10-CM | POA: Diagnosis not present

## 2019-05-11 DIAGNOSIS — R296 Repeated falls: Secondary | ICD-10-CM | POA: Diagnosis not present

## 2019-05-11 DIAGNOSIS — S81811D Laceration without foreign body, right lower leg, subsequent encounter: Secondary | ICD-10-CM | POA: Diagnosis not present

## 2019-05-11 DIAGNOSIS — Z4800 Encounter for change or removal of nonsurgical wound dressing: Secondary | ICD-10-CM | POA: Diagnosis not present

## 2019-05-11 DIAGNOSIS — S51811D Laceration without foreign body of right forearm, subsequent encounter: Secondary | ICD-10-CM | POA: Diagnosis not present

## 2019-05-11 DIAGNOSIS — G8929 Other chronic pain: Secondary | ICD-10-CM

## 2019-05-11 DIAGNOSIS — S51012D Laceration without foreign body of left elbow, subsequent encounter: Secondary | ICD-10-CM | POA: Diagnosis not present

## 2019-05-11 DIAGNOSIS — D693 Immune thrombocytopenic purpura: Secondary | ICD-10-CM | POA: Diagnosis not present

## 2019-05-12 DIAGNOSIS — S81811D Laceration without foreign body, right lower leg, subsequent encounter: Secondary | ICD-10-CM | POA: Diagnosis not present

## 2019-05-12 DIAGNOSIS — Z4800 Encounter for change or removal of nonsurgical wound dressing: Secondary | ICD-10-CM | POA: Diagnosis not present

## 2019-05-12 DIAGNOSIS — D693 Immune thrombocytopenic purpura: Secondary | ICD-10-CM | POA: Diagnosis not present

## 2019-05-12 DIAGNOSIS — S51811D Laceration without foreign body of right forearm, subsequent encounter: Secondary | ICD-10-CM | POA: Diagnosis not present

## 2019-05-12 DIAGNOSIS — S71111D Laceration without foreign body, right thigh, subsequent encounter: Secondary | ICD-10-CM | POA: Diagnosis not present

## 2019-05-12 DIAGNOSIS — S51012D Laceration without foreign body of left elbow, subsequent encounter: Secondary | ICD-10-CM | POA: Diagnosis not present

## 2019-05-12 DIAGNOSIS — R296 Repeated falls: Secondary | ICD-10-CM | POA: Diagnosis not present

## 2019-05-12 DIAGNOSIS — W19XXXD Unspecified fall, subsequent encounter: Secondary | ICD-10-CM | POA: Diagnosis not present

## 2019-05-12 DIAGNOSIS — S51011D Laceration without foreign body of right elbow, subsequent encounter: Secondary | ICD-10-CM | POA: Diagnosis not present

## 2019-05-14 NOTE — Progress Notes (Signed)
[facility]  Telephone:(336) [phone]  [phone]     Ariana [name] DOB: [birth year]  MR#: [id number]  [id number]#:[id number]  Patient Care Team: Idelle Crouch, MD as PCP - General (Internal Medicine)  CHIEF COMPLAINT: Chronic refractory ITP  INTERVAL HISTORY: Patient returns to clinic today for further evaluation and continuation of Nplate.  She has had no falls in the interim. She continues to have increased confusion and is a poor historian, which seems slightly worse this week.  She has no neurologic complaints.  She denies any recent fevers or illnesses.  She denies any chest pain, shortness of breath, cough, or hemoptysis.  She denies any nausea, vomiting, constipation, or diarrhea.  She has no melena or hematochezia.  She has no urinary complaints.  Patient offers no specific complaints today.  REVIEW OF SYSTEMS:   Review of Systems  Constitutional: Negative.  Negative for fever, malaise/fatigue and weight loss.  HENT: Negative for congestion.   Respiratory: Negative.  Negative for cough, hemoptysis and shortness of breath.   Cardiovascular: Negative.  Negative for chest pain and leg swelling.  Gastrointestinal: Negative.  Negative for abdominal pain, blood in stool, diarrhea, melena and nausea.  Genitourinary: Negative.  Negative for dysuria and hematuria.  Musculoskeletal: Negative.  Negative for back pain and falls.  Skin: Negative.  Negative for rash.  Neurological: Negative.  Negative for sensory change, focal weakness and weakness.  Endo/Heme/Allergies: Bruises/bleeds easily.  Psychiatric/Behavioral: Positive for memory loss. Negative for depression. The patient is not nervous/anxious.     As per HPI. Otherwise, a complete review of systems is negative.  PAST MEDICAL HISTORY: Past Medical History:  Diagnosis Date  . Alzheimer disease ([place])   . Anemia   . Anxiety   . Asthma   . Chronic back pain   . Depression   . Depression   . GERD  (gastroesophageal reflux disease)   . Hiatal hernia   . Hypercholesteremia   . ITP (idiopathic thrombocytopenic purpura)   . Osteoarthritis   . Osteoporosis   . Pulmonary emboli ([place])   . Restless legs     PAST SURGICAL HISTORY: Past Surgical History:  Procedure Laterality Date  . CARPAL TUNNEL RELEASE    . ESOPHAGOGASTRODUODENOSCOPY (EGD) WITH PROPOFOL N/A 09/10/2015   Procedure: ESOPHAGOGASTRODUODENOSCOPY (EGD) WITH PROPOFOL;  Surgeon: Lollie Sails, MD;  Location: [facility] ENDOSCOPY;  Service: Endoscopy;  Laterality: N/A;  . IRRIGATION AND DEBRIDEMENT HEMATOMA Right 07/13/2018   Procedure: IRRIGATION AND DEBRIDEMENT HEMATOMA-RIGHT SHIN;  Surgeon: Herbert Pun, MD;  Location: [facility] ORS;  Service: General;  Laterality: Right;  . KYPHOPLASTY N/A 05/02/2019   Procedure: L1 KYPHOPLASTY;  Surgeon: Hessie Knows, MD;  Location: [facility] ORS;  Service: Orthopedics;  Laterality: N/A;  . LUMBAR [place]    . SPLENECTOMY, PARTIAL    . TOTAL ABDOMINAL HYSTERECTOMY      FAMILY HISTORY Family History  Problem Relation Age of Onset  . Stroke Mother     GYNECOLOGIC HISTORY:  No LMP recorded. Patient is postmenopausal.     ADVANCED DIRECTIVES:    HEALTH MAINTENANCE: Social History   Tobacco Use  . Smoking status: Never Smoker  . Smokeless tobacco: Never Used  Substance Use Topics  . Alcohol use: No    Alcohol/week: 0.0 standard drinks  . Drug use: No     Allergies  Allergen Reactions  . Aspirin     Upset stomach  . Diazepam Itching    sneezing  . Other  seasonal allergies  . Tetanus Toxoids     Localized superficial swelling of skin  . Morphine Itching and Rash    Current Outpatient Medications  Medication Sig Dispense Refill  . acetaminophen (TYLENOL) 325 MG tablet Take 2 tablets (650 mg total) by mouth every 6 (six) hours as needed for mild pain (or Fever >/= 101). 100 tablet 0  . albuterol (PROVENTIL HFA;VENTOLIN HFA) 108 (90 BASE) MCG/ACT inhaler  Inhale 2 puffs into the lungs every 6 (six) hours as needed for wheezing or shortness of breath.    . ALPRAZolam (XANAX) 0.5 MG tablet Take 1 tablet (0.5 mg total) by mouth 3 (three) times daily as needed for anxiety. 20 tablet 0  . buPROPion (WELLBUTRIN XL) 150 MG 24 hr tablet Take 150 mg by mouth daily.    . busPIRone (BUSPAR) 15 MG tablet Take 15 mg by mouth daily.     . Calcium Carbonate-Vit D-Min (CALCIUM 600+D PLUS MINERALS) 600-400 MG-UNIT TABS Take 1 tablet by mouth daily.     . Carboxymethylcellul-Glycerin (LUBRICATING EYE DROPS OP) Place 1 drop into both eyes daily as needed (dry eyes).    . cholecalciferol (VITAMIN D3) 25 MCG (1000 UT) tablet Take 1,000 Units by mouth 2 (two) times a day.    . citalopram (CELEXA) 40 MG tablet Take 40 mg by mouth daily.    . Cyanocobalamin (RA VITAMIN B-12 TR) 1000 MCG TBCR Take 1,000 mcg by mouth daily.     . cyclobenzaprine (FLEXERIL) 5 MG tablet Take 5 mg by mouth 3 (three) times daily as needed for muscle spasms.    Marland Kitchen donepezil (ARICEPT) 10 MG tablet Take 10 mg by mouth at bedtime.     . fluticasone (FLONASE) 50 MCG/ACT nasal spray Place 2 sprays into both nostrils daily as needed for allergies.     . Fluticasone-Salmeterol (ADVAIR) 250-50 MCG/DOSE AEPB Inhale 1 puff into the lungs daily as needed (shortness of breath).     . gabapentin (NEURONTIN) 300 MG capsule Take 300 mg by mouth 4 (four) times daily.    Marland Kitchen HYDROcodone-acetaminophen (NORCO) 5-325 MG tablet Take 1 tablet by mouth every 6 (six) hours as needed for moderate pain. 30 tablet 0  . levothyroxine (SYNTHROID, LEVOTHROID) 50 MCG tablet Take 50 mcg by mouth daily before breakfast. Take 30 to 60 minutes before breakfast.    . memantine (NAMENDA) 10 MG tablet Take 10 mg by mouth daily.     . montelukast (SINGULAIR) 10 MG tablet Take 10 mg by mouth at bedtime.     . Multiple Vitamin (MULTI-VITAMIN) tablet Take 1 tablet by mouth daily.     . Multiple Vitamins-Minerals (PRESERVISION AREDS 2) CAPS  Take 1 capsule by mouth 2 (two) times a day.    Marland Kitchen NARCAN 4 MG/0.1ML LIQD nasal spray kit Place 1 spray into the nose once.     Marland Kitchen oxybutynin (DITROPAN) 5 MG tablet Take 5 mg by mouth 3 (three) times daily.    . pantoprazole (PROTONIX) 20 MG tablet Take 20 mg by mouth 2 (two) times daily.     . pravastatin (PRAVACHOL) 20 MG tablet Take 20 mg by mouth daily.    . ranitidine (ZANTAC) 300 MG tablet Take 300 mg by mouth at bedtime.     . traMADol (ULTRAM) 50 MG tablet Take 50 mg by mouth 2 (two) times daily.   5  . vitamin C (ASCORBIC ACID) 500 MG tablet Take 500 mg by mouth daily.    . vitamin E (  E-400) 400 UNIT capsule Take 400 Units by mouth daily.     No current facility-administered medications for this visit.     OBJECTIVE: BP (!) 144/75 (BP Location: Left Arm, Patient Position: Sitting)   Pulse (!) 58   Temp 97.7 F (36.5 C) (Tympanic)   Ht '5\' 6"'$  (1.676 m)   Wt 139 lb (63 kg)   BMI 22.44 kg/m    Body mass index is 22.44 kg/m.    ECOG FS:1 - Symptomatic but completely ambulatory  General: Well-developed, well-nourished, no acute distress. Eyes: Pink conjunctiva, anicteric sclera. HEENT: Normocephalic, moist mucous membrane. Lungs: Clear to auscultation bilaterally. Heart: Regular rate and rhythm. No rubs, murmurs, or gallops. Abdomen: Soft, nontender, nondistended. No organomegaly noted, normoactive bowel sounds. Musculoskeletal: No edema, cyanosis, or clubbing. Neuro: Alert, answering all questions appropriately. Cranial nerves grossly intact. Skin: No rashes or petechiae noted.  Ecchymosis noted on bilateral upper extremities.   Psych: Normal affect.  LAB RESULTS:  Appointment on 05/18/2019  Component Date Value Ref Range Status  . WBC 05/18/2019 14.4* 4.0 - 10.5 K/uL Final  . RBC 05/18/2019 3.82* 3.87 - 5.11 MIL/uL Final  . Hemoglobin 05/18/2019 11.0* 12.0 - 15.0 g/dL Final  . HCT 05/18/2019 34.8* 36.0 - 46.0 % Final  . MCV 05/18/2019 91.1  80.0 - 100.0 fL Final  . MCH  05/18/2019 28.8  26.0 - 34.0 pg Final  . MCHC 05/18/2019 31.6  30.0 - 36.0 g/dL Final  . RDW 05/18/2019 19.9* 11.5 - 15.5 % Final  . Platelets 05/18/2019 34* 150 - 400 K/uL Final   Comment: Immature Platelet Fraction may be clinically indicated, consider ordering this additional test [id number]   . nRBC 05/18/2019 0.0  0.0 - 0.2 % Final  . Neutrophils Relative % 05/18/2019 74  % Final  . Neutro Abs 05/18/2019 10.7* 1.7 - 7.7 K/uL Final  . Lymphocytes Relative 05/18/2019 16  % Final  . Lymphs Abs 05/18/2019 2.4  0.7 - 4.0 K/uL Final  . Monocytes Relative 05/18/2019 7  % Final  . Monocytes Absolute 05/18/2019 1.0  0.1 - 1.0 K/uL Final  . Eosinophils Relative 05/18/2019 2  % Final  . Eosinophils Absolute 05/18/2019 0.2  0.0 - 0.5 K/uL Final  . Basophils Relative 05/18/2019 1  % Final  . Basophils Absolute 05/18/2019 0.1  0.0 - 0.1 K/uL Final  . Immature Granulocytes 05/18/2019 0  % Final  . Abs Immature Granulocytes 05/18/2019 0.04  0.00 - 0.07 K/uL Final   Performed at [facility], [address] [place], [place] [address]    STUDIES: No results found.  ASSESSMENT:  Chronic refractory ITP  PLAN:   1. Chronic refractory ITP: Patient had a poor response to Prednisone, WinRho, and Rituxan. She is status post splenectomy in 2007.  She received IVIG and Nplate she received while at [facility] with significant improvement of her platelets.  [facility] has recommended weekly Nplate, but given this patient's comorbidities, weekly clinic visits would be difficult.  IVIG also works, but because of the visitor restrictions during COVID-19 patient likely would not do well sitting in the clinic alone all day for multiple days at a time.  Patient is currently receiving the max dose of Nplate at 10 mcg/kg.  We can consider increasing the frequency of Nplate to every 2 to 3 weeks, Promacta 12.'5mg'$  or Tavalisse 100 mg BID also could be used if needed.  Patient's platelet count is 34 today.  Proceed with  endplate.  Return to clinic  in 2 weeks for further evaluation and continuation of treatment if necessary. 2. Weakness and fatigue: Chronic and unchanged. 3. Depression/dementia: Seems to be slightly worse today.  Patient is now at [employer]. 4.  Leg wound: Resolved.  Continue surgical dressings as directed. 5.  Anemia: Likely secondary to recent leg bleed.  Hemoglobin slowly improving is now 11.0.  Patient expressed understanding and was in agreement with this plan. She also understands that She can call clinic at any time with any questions, concerns, or complaints.    Lloyd Huger, MD   05/19/2019 6:52 AM

## 2019-05-15 DIAGNOSIS — S51811D Laceration without foreign body of right forearm, subsequent encounter: Secondary | ICD-10-CM | POA: Diagnosis not present

## 2019-05-15 DIAGNOSIS — S51012D Laceration without foreign body of left elbow, subsequent encounter: Secondary | ICD-10-CM | POA: Diagnosis not present

## 2019-05-15 DIAGNOSIS — R296 Repeated falls: Secondary | ICD-10-CM | POA: Diagnosis not present

## 2019-05-15 DIAGNOSIS — S71111D Laceration without foreign body, right thigh, subsequent encounter: Secondary | ICD-10-CM | POA: Diagnosis not present

## 2019-05-15 DIAGNOSIS — D693 Immune thrombocytopenic purpura: Secondary | ICD-10-CM | POA: Diagnosis not present

## 2019-05-15 DIAGNOSIS — W19XXXD Unspecified fall, subsequent encounter: Secondary | ICD-10-CM | POA: Diagnosis not present

## 2019-05-15 DIAGNOSIS — Z4800 Encounter for change or removal of nonsurgical wound dressing: Secondary | ICD-10-CM | POA: Diagnosis not present

## 2019-05-15 DIAGNOSIS — S51011D Laceration without foreign body of right elbow, subsequent encounter: Secondary | ICD-10-CM | POA: Diagnosis not present

## 2019-05-15 DIAGNOSIS — S81811D Laceration without foreign body, right lower leg, subsequent encounter: Secondary | ICD-10-CM | POA: Diagnosis not present

## 2019-05-16 DIAGNOSIS — D693 Immune thrombocytopenic purpura: Secondary | ICD-10-CM | POA: Diagnosis not present

## 2019-05-16 DIAGNOSIS — W19XXXD Unspecified fall, subsequent encounter: Secondary | ICD-10-CM | POA: Diagnosis not present

## 2019-05-16 DIAGNOSIS — S81811D Laceration without foreign body, right lower leg, subsequent encounter: Secondary | ICD-10-CM | POA: Diagnosis not present

## 2019-05-16 DIAGNOSIS — S51811D Laceration without foreign body of right forearm, subsequent encounter: Secondary | ICD-10-CM | POA: Diagnosis not present

## 2019-05-16 DIAGNOSIS — Z4800 Encounter for change or removal of nonsurgical wound dressing: Secondary | ICD-10-CM | POA: Diagnosis not present

## 2019-05-16 DIAGNOSIS — S51012D Laceration without foreign body of left elbow, subsequent encounter: Secondary | ICD-10-CM | POA: Diagnosis not present

## 2019-05-16 DIAGNOSIS — S71111D Laceration without foreign body, right thigh, subsequent encounter: Secondary | ICD-10-CM | POA: Diagnosis not present

## 2019-05-16 DIAGNOSIS — S51011D Laceration without foreign body of right elbow, subsequent encounter: Secondary | ICD-10-CM | POA: Diagnosis not present

## 2019-05-16 DIAGNOSIS — R296 Repeated falls: Secondary | ICD-10-CM | POA: Diagnosis not present

## 2019-05-17 ENCOUNTER — Other Ambulatory Visit: Payer: Self-pay

## 2019-05-18 ENCOUNTER — Other Ambulatory Visit: Payer: Self-pay

## 2019-05-18 ENCOUNTER — Encounter: Payer: Self-pay | Admitting: Oncology

## 2019-05-18 ENCOUNTER — Inpatient Hospital Stay: Payer: Medicare HMO

## 2019-05-18 ENCOUNTER — Inpatient Hospital Stay: Payer: Medicare HMO | Attending: Oncology

## 2019-05-18 ENCOUNTER — Inpatient Hospital Stay (HOSPITAL_BASED_OUTPATIENT_CLINIC_OR_DEPARTMENT_OTHER): Payer: Medicare HMO | Admitting: Oncology

## 2019-05-18 VITALS — BP 144/75 | HR 58 | Temp 97.7°F | Ht 66.0 in | Wt 139.0 lb

## 2019-05-18 DIAGNOSIS — D693 Immune thrombocytopenic purpura: Secondary | ICD-10-CM

## 2019-05-18 DIAGNOSIS — R5383 Other fatigue: Secondary | ICD-10-CM | POA: Insufficient documentation

## 2019-05-18 DIAGNOSIS — F0280 Dementia in other diseases classified elsewhere without behavioral disturbance: Secondary | ICD-10-CM | POA: Diagnosis not present

## 2019-05-18 DIAGNOSIS — D649 Anemia, unspecified: Secondary | ICD-10-CM | POA: Diagnosis not present

## 2019-05-18 DIAGNOSIS — R531 Weakness: Secondary | ICD-10-CM | POA: Insufficient documentation

## 2019-05-18 DIAGNOSIS — F329 Major depressive disorder, single episode, unspecified: Secondary | ICD-10-CM | POA: Diagnosis not present

## 2019-05-18 DIAGNOSIS — Z9081 Acquired absence of spleen: Secondary | ICD-10-CM | POA: Diagnosis not present

## 2019-05-18 DIAGNOSIS — Z86711 Personal history of pulmonary embolism: Secondary | ICD-10-CM | POA: Insufficient documentation

## 2019-05-18 LAB — CBC WITH DIFFERENTIAL/PLATELET
Abs Immature Granulocytes: 0.04 10*3/uL (ref 0.00–0.07)
Basophils Absolute: 0.1 10*3/uL (ref 0.0–0.1)
Basophils Relative: 1 %
Eosinophils Absolute: 0.2 10*3/uL (ref 0.0–0.5)
Eosinophils Relative: 2 %
HCT: 34.8 % — ABNORMAL LOW (ref 36.0–46.0)
Hemoglobin: 11 g/dL — ABNORMAL LOW (ref 12.0–15.0)
Immature Granulocytes: 0 %
Lymphocytes Relative: 16 %
Lymphs Abs: 2.4 10*3/uL (ref 0.7–4.0)
MCH: 28.8 pg (ref 26.0–34.0)
MCHC: 31.6 g/dL (ref 30.0–36.0)
MCV: 91.1 fL (ref 80.0–100.0)
Monocytes Absolute: 1 10*3/uL (ref 0.1–1.0)
Monocytes Relative: 7 %
Neutro Abs: 10.7 10*3/uL — ABNORMAL HIGH (ref 1.7–7.7)
Neutrophils Relative %: 74 %
Platelets: 34 10*3/uL — ABNORMAL LOW (ref 150–400)
RBC: 3.82 MIL/uL — ABNORMAL LOW (ref 3.87–5.11)
RDW: 19.9 % — ABNORMAL HIGH (ref 11.5–15.5)
WBC: 14.4 10*3/uL — ABNORMAL HIGH (ref 4.0–10.5)
nRBC: 0 % (ref 0.0–0.2)

## 2019-05-18 MED ORDER — ROMIPLOSTIM INJECTION 500 MCG
10.0000 ug/kg | Freq: Once | SUBCUTANEOUS | Status: AC
  Administered 2019-05-18: 11:00:00 630 ug via SUBCUTANEOUS
  Filled 2019-05-18: qty 1

## 2019-05-18 NOTE — Progress Notes (Signed)
Patient stated that she had been doing well with no complaints. 

## 2019-05-22 DIAGNOSIS — S71111D Laceration without foreign body, right thigh, subsequent encounter: Secondary | ICD-10-CM | POA: Diagnosis not present

## 2019-05-22 DIAGNOSIS — S51811D Laceration without foreign body of right forearm, subsequent encounter: Secondary | ICD-10-CM | POA: Diagnosis not present

## 2019-05-22 DIAGNOSIS — R296 Repeated falls: Secondary | ICD-10-CM | POA: Diagnosis not present

## 2019-05-22 DIAGNOSIS — Z9889 Other specified postprocedural states: Secondary | ICD-10-CM | POA: Diagnosis not present

## 2019-05-22 DIAGNOSIS — M5416 Radiculopathy, lumbar region: Secondary | ICD-10-CM | POA: Diagnosis not present

## 2019-05-22 DIAGNOSIS — M5136 Other intervertebral disc degeneration, lumbar region: Secondary | ICD-10-CM | POA: Diagnosis not present

## 2019-05-22 DIAGNOSIS — Z4800 Encounter for change or removal of nonsurgical wound dressing: Secondary | ICD-10-CM | POA: Diagnosis not present

## 2019-05-22 DIAGNOSIS — S51011D Laceration without foreign body of right elbow, subsequent encounter: Secondary | ICD-10-CM | POA: Diagnosis not present

## 2019-05-22 DIAGNOSIS — S51012D Laceration without foreign body of left elbow, subsequent encounter: Secondary | ICD-10-CM | POA: Diagnosis not present

## 2019-05-22 DIAGNOSIS — W19XXXD Unspecified fall, subsequent encounter: Secondary | ICD-10-CM | POA: Diagnosis not present

## 2019-05-22 DIAGNOSIS — S81811D Laceration without foreign body, right lower leg, subsequent encounter: Secondary | ICD-10-CM | POA: Diagnosis not present

## 2019-05-22 DIAGNOSIS — D693 Immune thrombocytopenic purpura: Secondary | ICD-10-CM | POA: Diagnosis not present

## 2019-05-23 ENCOUNTER — Ambulatory Visit: Payer: Medicare HMO | Admitting: Oncology

## 2019-05-23 ENCOUNTER — Ambulatory Visit: Payer: Medicare HMO

## 2019-05-23 ENCOUNTER — Other Ambulatory Visit: Payer: Medicare HMO

## 2019-05-24 ENCOUNTER — Ambulatory Visit: Payer: Medicare HMO | Admitting: Oncology

## 2019-05-24 ENCOUNTER — Other Ambulatory Visit: Payer: Medicare HMO

## 2019-05-24 ENCOUNTER — Ambulatory Visit: Payer: Medicare HMO

## 2019-05-24 DIAGNOSIS — S51012D Laceration without foreign body of left elbow, subsequent encounter: Secondary | ICD-10-CM | POA: Diagnosis not present

## 2019-05-24 DIAGNOSIS — S51811D Laceration without foreign body of right forearm, subsequent encounter: Secondary | ICD-10-CM | POA: Diagnosis not present

## 2019-05-24 DIAGNOSIS — S71111D Laceration without foreign body, right thigh, subsequent encounter: Secondary | ICD-10-CM | POA: Diagnosis not present

## 2019-05-24 DIAGNOSIS — S51011D Laceration without foreign body of right elbow, subsequent encounter: Secondary | ICD-10-CM | POA: Diagnosis not present

## 2019-05-24 DIAGNOSIS — R296 Repeated falls: Secondary | ICD-10-CM | POA: Diagnosis not present

## 2019-05-24 DIAGNOSIS — D693 Immune thrombocytopenic purpura: Secondary | ICD-10-CM | POA: Diagnosis not present

## 2019-05-24 DIAGNOSIS — W19XXXD Unspecified fall, subsequent encounter: Secondary | ICD-10-CM | POA: Diagnosis not present

## 2019-05-24 DIAGNOSIS — S81811D Laceration without foreign body, right lower leg, subsequent encounter: Secondary | ICD-10-CM | POA: Diagnosis not present

## 2019-05-24 DIAGNOSIS — Z4800 Encounter for change or removal of nonsurgical wound dressing: Secondary | ICD-10-CM | POA: Diagnosis not present

## 2019-05-26 NOTE — Progress Notes (Deleted)
[facility]  Telephone:(336) [phone]  Fax:(336) [phone]     Ariana [name] DOB: July 26, 1933  MR#: [id number]  XVQ#:[id number]  Patient Care Team: Idelle Crouch, MD as PCP - General (Internal Medicine)  CHIEF COMPLAINT: Chronic refractory ITP  INTERVAL HISTORY: Patient returns to clinic today for further evaluation and continuation of Nplate.  She has had no falls in the interim. She continues to have increased confusion and is a poor historian, which seems slightly worse this week.  She has no neurologic complaints.  She denies any recent fevers or illnesses.  She denies any chest pain, shortness of breath, cough, or hemoptysis.  She denies any nausea, vomiting, constipation, or diarrhea.  She has no melena or hematochezia.  She has no urinary complaints.  Patient offers no specific complaints today.  REVIEW OF SYSTEMS:   Review of Systems  Constitutional: Negative.  Negative for fever, malaise/fatigue and weight loss.  HENT: Negative for congestion.   Respiratory: Negative.  Negative for cough, hemoptysis and shortness of breath.   Cardiovascular: Negative.  Negative for chest pain and leg swelling.  Gastrointestinal: Negative.  Negative for abdominal pain, blood in stool, diarrhea, melena and nausea.  Genitourinary: Negative.  Negative for dysuria and hematuria.  Musculoskeletal: Negative.  Negative for back pain and falls.  Skin: Negative.  Negative for rash.  Neurological: Negative.  Negative for sensory change, focal weakness and weakness.  Endo/Heme/Allergies: Bruises/bleeds easily.  Psychiatric/Behavioral: Positive for memory loss. Negative for depression. The patient is not nervous/anxious.     As per HPI. Otherwise, a complete review of systems is negative.  PAST MEDICAL HISTORY: Past Medical History:  Diagnosis Date  . Alzheimer disease ([place])   . Anemia   . Anxiety   . Asthma   . Chronic back pain   . Depression   . Depression   . GERD  (gastroesophageal reflux disease)   . Hiatal hernia   . Hypercholesteremia   . ITP (idiopathic thrombocytopenic purpura)   . Osteoarthritis   . Osteoporosis   . Pulmonary emboli ([place])   . Restless legs     PAST SURGICAL HISTORY: Past Surgical History:  Procedure Laterality Date  . CARPAL TUNNEL RELEASE    . ESOPHAGOGASTRODUODENOSCOPY (EGD) WITH PROPOFOL N/A 09/10/2015   Procedure: ESOPHAGOGASTRODUODENOSCOPY (EGD) WITH PROPOFOL;  Surgeon: Lollie Sails, MD;  Location: [facility] ENDOSCOPY;  Service: Endoscopy;  Laterality: N/A;  . IRRIGATION AND DEBRIDEMENT HEMATOMA Right 07/13/2018   Procedure: IRRIGATION AND DEBRIDEMENT HEMATOMA-RIGHT SHIN;  Surgeon: Herbert Pun, MD;  Location: [facility] ORS;  Service: General;  Laterality: Right;  . KYPHOPLASTY N/A 05/02/2019   Procedure: L1 KYPHOPLASTY;  Surgeon: Hessie Knows, MD;  Location: [facility] ORS;  Service: Orthopedics;  Laterality: N/A;  . LUMBAR [place]    . SPLENECTOMY, PARTIAL    . TOTAL ABDOMINAL HYSTERECTOMY      FAMILY HISTORY Family History  Problem Relation Age of Onset  . Stroke Mother     GYNECOLOGIC HISTORY:  No LMP recorded. Patient is postmenopausal.     ADVANCED DIRECTIVES:    HEALTH MAINTENANCE: Social History   Tobacco Use  . Smoking status: Never Smoker  . Smokeless tobacco: Never Used  Substance Use Topics  . Alcohol use: No    Alcohol/week: 0.0 standard drinks  . Drug use: No     Allergies  Allergen Reactions  . Aspirin     Upset stomach  . Diazepam Itching    sneezing  . Other  seasonal allergies  . Tetanus Toxoids     Localized superficial swelling of skin  . Morphine Itching and Rash    Current Outpatient Medications  Medication Sig Dispense Refill  . acetaminophen (TYLENOL) 325 MG tablet Take 2 tablets (650 mg total) by mouth every 6 (six) hours as needed for mild pain (or Fever >/= 101). 100 tablet 0  . albuterol (PROVENTIL HFA;VENTOLIN HFA) 108 (90 BASE) MCG/ACT inhaler  Inhale 2 puffs into the lungs every 6 (six) hours as needed for wheezing or shortness of breath.    . ALPRAZolam (XANAX) 0.5 MG tablet Take 1 tablet (0.5 mg total) by mouth 3 (three) times daily as needed for anxiety. 20 tablet 0  . buPROPion (WELLBUTRIN XL) 150 MG 24 hr tablet Take 150 mg by mouth daily.    . busPIRone (BUSPAR) 15 MG tablet Take 15 mg by mouth daily.     . Calcium Carbonate-Vit D-Min (CALCIUM 600+D PLUS MINERALS) 600-400 MG-UNIT TABS Take 1 tablet by mouth daily.     . Carboxymethylcellul-Glycerin (LUBRICATING EYE DROPS OP) Place 1 drop into both eyes daily as needed (dry eyes).    . cholecalciferol (VITAMIN D3) 25 MCG (1000 UT) tablet Take 1,000 Units by mouth 2 (two) times a day.    . citalopram (CELEXA) 40 MG tablet Take 40 mg by mouth daily.    . Cyanocobalamin (RA VITAMIN B-12 TR) 1000 MCG TBCR Take 1,000 mcg by mouth daily.     . cyclobenzaprine (FLEXERIL) 5 MG tablet Take 5 mg by mouth 3 (three) times daily as needed for muscle spasms.    Marland Kitchen donepezil (ARICEPT) 10 MG tablet Take 10 mg by mouth at bedtime.     . fluticasone (FLONASE) 50 MCG/ACT nasal spray Place 2 sprays into both nostrils daily as needed for allergies.     . Fluticasone-Salmeterol (ADVAIR) 250-50 MCG/DOSE AEPB Inhale 1 puff into the lungs daily as needed (shortness of breath).     . gabapentin (NEURONTIN) 300 MG capsule Take 300 mg by mouth 4 (four) times daily.    Marland Kitchen HYDROcodone-acetaminophen (NORCO) 5-325 MG tablet Take 1 tablet by mouth every 6 (six) hours as needed for moderate pain. 30 tablet 0  . levothyroxine (SYNTHROID, LEVOTHROID) 50 MCG tablet Take 50 mcg by mouth daily before breakfast. Take 30 to 60 minutes before breakfast.    . memantine (NAMENDA) 10 MG tablet Take 10 mg by mouth daily.     . montelukast (SINGULAIR) 10 MG tablet Take 10 mg by mouth at bedtime.     . Multiple Vitamin (MULTI-VITAMIN) tablet Take 1 tablet by mouth daily.     . Multiple Vitamins-Minerals (PRESERVISION AREDS 2) CAPS  Take 1 capsule by mouth 2 (two) times a day.    Marland Kitchen NARCAN 4 MG/0.1ML LIQD nasal spray kit Place 1 spray into the nose once.     Marland Kitchen oxybutynin (DITROPAN) 5 MG tablet Take 5 mg by mouth 3 (three) times daily.    . pantoprazole (PROTONIX) 20 MG tablet Take 20 mg by mouth 2 (two) times daily.     . pravastatin (PRAVACHOL) 20 MG tablet Take 20 mg by mouth daily.    . ranitidine (ZANTAC) 300 MG tablet Take 300 mg by mouth at bedtime.     . traMADol (ULTRAM) 50 MG tablet Take 50 mg by mouth 2 (two) times daily.   5  . vitamin C (ASCORBIC ACID) 500 MG tablet Take 500 mg by mouth daily.    . vitamin E (  E-400) 400 UNIT capsule Take 400 Units by mouth daily.     No current facility-administered medications for this visit.     OBJECTIVE: There were no vitals taken for this visit.   There is no height or weight on file to calculate BMI.    ECOG FS:1 - Symptomatic but completely ambulatory  General: Well-developed, well-nourished, no acute distress. Eyes: Pink conjunctiva, anicteric sclera. HEENT: Normocephalic, moist mucous membrane. Lungs: Clear to auscultation bilaterally. Heart: Regular rate and rhythm. No rubs, murmurs, or gallops. Abdomen: Soft, nontender, nondistended. No organomegaly noted, normoactive bowel sounds. Musculoskeletal: No edema, cyanosis, or clubbing. Neuro: Alert, answering all questions appropriately. Cranial nerves grossly intact. Skin: No rashes or petechiae noted.  Ecchymosis noted on bilateral upper extremities.   Psych: Normal affect.  LAB RESULTS:  No visits with results within 3 Day(s) from this visit.  Latest known visit with results is:  Appointment on 05/18/2019  Component Date Value Ref Range Status  . WBC 05/18/2019 14.4* 4.0 - 10.5 K/uL Final  . RBC 05/18/2019 3.82* 3.87 - 5.11 MIL/uL Final  . Hemoglobin 05/18/2019 11.0* 12.0 - 15.0 g/dL Final  . HCT 05/18/2019 34.8* 36.0 - 46.0 % Final  . MCV 05/18/2019 91.1  80.0 - 100.0 fL Final  . MCH 05/18/2019 28.8   26.0 - 34.0 pg Final  . MCHC 05/18/2019 31.6  30.0 - 36.0 g/dL Final  . RDW 05/18/2019 19.9* 11.5 - 15.5 % Final  . Platelets 05/18/2019 34* 150 - 400 K/uL Final   Comment: Immature Platelet Fraction may be clinically indicated, consider ordering this additional test [id number]   . nRBC 05/18/2019 0.0  0.0 - 0.2 % Final  . Neutrophils Relative % 05/18/2019 74  % Final  . Neutro Abs 05/18/2019 10.7* 1.7 - 7.7 K/uL Final  . Lymphocytes Relative 05/18/2019 16  % Final  . Lymphs Abs 05/18/2019 2.4  0.7 - 4.0 K/uL Final  . Monocytes Relative 05/18/2019 7  % Final  . Monocytes Absolute 05/18/2019 1.0  0.1 - 1.0 K/uL Final  . Eosinophils Relative 05/18/2019 2  % Final  . Eosinophils Absolute 05/18/2019 0.2  0.0 - 0.5 K/uL Final  . Basophils Relative 05/18/2019 1  % Final  . Basophils Absolute 05/18/2019 0.1  0.0 - 0.1 K/uL Final  . Immature Granulocytes 05/18/2019 0  % Final  . Abs Immature Granulocytes 05/18/2019 0.04  0.00 - 0.07 K/uL Final   Performed at [facility], [address] [place], [place] [address]    STUDIES: No results found.  ASSESSMENT:  Chronic refractory ITP  PLAN:   1. Chronic refractory ITP: Patient had a poor response to Prednisone, WinRho, and Rituxan. She is status post splenectomy in 2007.  She received IVIG and Nplate she received while at [facility] with significant improvement of her platelets.  [facility] has recommended weekly Nplate, but given this patient's comorbidities, weekly clinic visits would be difficult.  IVIG also works, but because of the visitor restrictions during COVID-19 patient likely would not do well sitting in the clinic alone all day for multiple days at a time.  Patient is currently receiving the max dose of Nplate at 10 mcg/kg.  We can consider increasing the frequency of Nplate to every 2 to 3 weeks, Promacta 12.34m or Tavalisse 100 mg BID also could be used if needed.  Patient's platelet count is 34 today.  Proceed with endplate.  Return to  clinic in 2 weeks for further evaluation and continuation of treatment if necessary.  2. Weakness and fatigue: Chronic and unchanged. 3. Depression/dementia: Seems to be slightly worse today.  Patient is now at [employer]. 4.  Leg wound: Resolved.  Continue surgical dressings as directed. 5.  Anemia: Likely secondary to recent leg bleed.  Hemoglobin slowly improving is now 11.0.  Patient expressed understanding and was in agreement with this plan. She also understands that She can call clinic at any time with any questions, concerns, or complaints.    Lloyd Huger, MD   05/26/2019 4:02 PM

## 2019-05-29 DIAGNOSIS — S51011D Laceration without foreign body of right elbow, subsequent encounter: Secondary | ICD-10-CM | POA: Diagnosis not present

## 2019-05-29 DIAGNOSIS — S71111D Laceration without foreign body, right thigh, subsequent encounter: Secondary | ICD-10-CM | POA: Diagnosis not present

## 2019-05-29 DIAGNOSIS — Z4800 Encounter for change or removal of nonsurgical wound dressing: Secondary | ICD-10-CM | POA: Diagnosis not present

## 2019-05-29 DIAGNOSIS — S81811D Laceration without foreign body, right lower leg, subsequent encounter: Secondary | ICD-10-CM | POA: Diagnosis not present

## 2019-05-29 DIAGNOSIS — R296 Repeated falls: Secondary | ICD-10-CM | POA: Diagnosis not present

## 2019-05-29 DIAGNOSIS — W19XXXD Unspecified fall, subsequent encounter: Secondary | ICD-10-CM | POA: Diagnosis not present

## 2019-05-29 DIAGNOSIS — S51811D Laceration without foreign body of right forearm, subsequent encounter: Secondary | ICD-10-CM | POA: Diagnosis not present

## 2019-05-29 DIAGNOSIS — D693 Immune thrombocytopenic purpura: Secondary | ICD-10-CM | POA: Diagnosis not present

## 2019-05-29 DIAGNOSIS — S51012D Laceration without foreign body of left elbow, subsequent encounter: Secondary | ICD-10-CM | POA: Diagnosis not present

## 2019-05-31 DIAGNOSIS — R296 Repeated falls: Secondary | ICD-10-CM | POA: Diagnosis not present

## 2019-05-31 DIAGNOSIS — D693 Immune thrombocytopenic purpura: Secondary | ICD-10-CM | POA: Diagnosis not present

## 2019-05-31 DIAGNOSIS — S51012D Laceration without foreign body of left elbow, subsequent encounter: Secondary | ICD-10-CM | POA: Diagnosis not present

## 2019-05-31 DIAGNOSIS — Z4800 Encounter for change or removal of nonsurgical wound dressing: Secondary | ICD-10-CM | POA: Diagnosis not present

## 2019-05-31 DIAGNOSIS — S71111D Laceration without foreign body, right thigh, subsequent encounter: Secondary | ICD-10-CM | POA: Diagnosis not present

## 2019-05-31 DIAGNOSIS — W19XXXD Unspecified fall, subsequent encounter: Secondary | ICD-10-CM | POA: Diagnosis not present

## 2019-05-31 DIAGNOSIS — S81811D Laceration without foreign body, right lower leg, subsequent encounter: Secondary | ICD-10-CM | POA: Diagnosis not present

## 2019-05-31 DIAGNOSIS — S51811D Laceration without foreign body of right forearm, subsequent encounter: Secondary | ICD-10-CM | POA: Diagnosis not present

## 2019-05-31 DIAGNOSIS — S51011D Laceration without foreign body of right elbow, subsequent encounter: Secondary | ICD-10-CM | POA: Diagnosis not present

## 2019-06-01 ENCOUNTER — Other Ambulatory Visit: Payer: Self-pay

## 2019-06-01 ENCOUNTER — Inpatient Hospital Stay: Payer: Medicare HMO

## 2019-06-01 ENCOUNTER — Inpatient Hospital Stay: Payer: Medicare HMO | Admitting: Oncology

## 2019-06-02 ENCOUNTER — Inpatient Hospital Stay: Payer: Medicare HMO

## 2019-06-02 ENCOUNTER — Other Ambulatory Visit: Payer: Self-pay

## 2019-06-02 ENCOUNTER — Encounter: Payer: Self-pay | Admitting: Oncology

## 2019-06-02 ENCOUNTER — Inpatient Hospital Stay (HOSPITAL_BASED_OUTPATIENT_CLINIC_OR_DEPARTMENT_OTHER): Payer: Medicare HMO | Admitting: Oncology

## 2019-06-02 VITALS — BP 144/75 | HR 62 | Temp 98.0°F | Resp 16 | Wt 135.2 lb

## 2019-06-02 DIAGNOSIS — D693 Immune thrombocytopenic purpura: Secondary | ICD-10-CM | POA: Diagnosis not present

## 2019-06-02 DIAGNOSIS — F329 Major depressive disorder, single episode, unspecified: Secondary | ICD-10-CM | POA: Diagnosis not present

## 2019-06-02 DIAGNOSIS — D649 Anemia, unspecified: Secondary | ICD-10-CM | POA: Diagnosis not present

## 2019-06-02 DIAGNOSIS — Z9081 Acquired absence of spleen: Secondary | ICD-10-CM | POA: Diagnosis not present

## 2019-06-02 DIAGNOSIS — R5383 Other fatigue: Secondary | ICD-10-CM | POA: Diagnosis not present

## 2019-06-02 DIAGNOSIS — R531 Weakness: Secondary | ICD-10-CM | POA: Diagnosis not present

## 2019-06-02 DIAGNOSIS — Z86711 Personal history of pulmonary embolism: Secondary | ICD-10-CM | POA: Diagnosis not present

## 2019-06-02 DIAGNOSIS — F0280 Dementia in other diseases classified elsewhere without behavioral disturbance: Secondary | ICD-10-CM | POA: Diagnosis not present

## 2019-06-02 LAB — CBC WITH DIFFERENTIAL/PLATELET
Abs Immature Granulocytes: 0.1 10*3/uL — ABNORMAL HIGH (ref 0.00–0.07)
Basophils Absolute: 0.1 10*3/uL (ref 0.0–0.1)
Basophils Relative: 1 %
Eosinophils Absolute: 0.3 10*3/uL (ref 0.0–0.5)
Eosinophils Relative: 2 %
HCT: 38.2 % (ref 36.0–46.0)
Hemoglobin: 11.9 g/dL — ABNORMAL LOW (ref 12.0–15.0)
Immature Granulocytes: 1 %
Lymphocytes Relative: 23 %
Lymphs Abs: 3.1 10*3/uL (ref 0.7–4.0)
MCH: 27.9 pg (ref 26.0–34.0)
MCHC: 31.2 g/dL (ref 30.0–36.0)
MCV: 89.7 fL (ref 80.0–100.0)
Monocytes Absolute: 1.2 10*3/uL — ABNORMAL HIGH (ref 0.1–1.0)
Monocytes Relative: 8 %
Neutro Abs: 9 10*3/uL — ABNORMAL HIGH (ref 1.7–7.7)
Neutrophils Relative %: 65 %
Platelets: 336 10*3/uL (ref 150–400)
RBC: 4.26 MIL/uL (ref 3.87–5.11)
RDW: 18.3 % — ABNORMAL HIGH (ref 11.5–15.5)
Smear Review: ADEQUATE
WBC: 13.7 10*3/uL — ABNORMAL HIGH (ref 4.0–10.5)
nRBC: 0 % (ref 0.0–0.2)

## 2019-06-02 NOTE — Progress Notes (Signed)
Ariana [name]  [phone]) [phone]  [phone]) [phone]     Ariana [name] DOB: 10/10/1933  MR#: [id number]  GUR#:[id number]  Patient Care Team: Idelle Crouch, MD as PCP - General (Internal Medicine)  CHIEF COMPLAINT: Chronic refractory ITP  INTERVAL HISTORY: Patient returns to clinic today for further evaluation, laboratory work and consideration of Nplate.  She currently feels well and is at her baseline.  She denies any further falls.  She continues to have increased confusion and is a poor historian.  She has no neurologic complaints.  She denies any recent fevers or illnesses.  She denies any chest pain, shortness of breath, cough, or hemoptysis.  She denies any nausea, vomiting, constipation, or diarrhea.  She has no melena or hematochezia.  She has no urinary complaints.  Patient offers no specific complaints today.  REVIEW OF SYSTEMS:   Review of Systems  Constitutional: Negative.  Negative for fever, malaise/fatigue and weight loss.  HENT: Negative for congestion.   Respiratory: Negative.  Negative for cough, hemoptysis and shortness of breath.   Cardiovascular: Negative.  Negative for chest pain and leg swelling.  Gastrointestinal: Negative.  Negative for abdominal pain, blood in stool, diarrhea, melena and nausea.  Genitourinary: Negative.  Negative for dysuria and hematuria.  Musculoskeletal: Negative.  Negative for back pain and falls.  Skin: Negative.  Negative for rash.  Neurological: Negative.  Negative for sensory change, focal weakness and weakness.  Endo/Heme/Allergies: Bruises/bleeds easily.  Psychiatric/Behavioral: Positive for memory loss. Negative for depression. The patient is not nervous/anxious.     As per HPI. Otherwise, a complete review of systems is negative.  PAST MEDICAL HISTORY: Past Medical History:  Diagnosis Date  . Alzheimer disease ([place])   . Anemia   . Anxiety   . Asthma   . Chronic back pain   . Depression    . Depression   . GERD (gastroesophageal reflux disease)   . Hiatal hernia   . Hypercholesteremia   . ITP (idiopathic thrombocytopenic purpura)   . Osteoarthritis   . Osteoporosis   . Pulmonary emboli ([place])   . Restless legs     PAST SURGICAL HISTORY: Past Surgical History:  Procedure Laterality Date  . CARPAL TUNNEL RELEASE    . ESOPHAGOGASTRODUODENOSCOPY (EGD) WITH PROPOFOL N/A 09/10/2015   Procedure: ESOPHAGOGASTRODUODENOSCOPY (EGD) WITH PROPOFOL;  Surgeon: Lollie Sails, MD;  Location: [facility] ENDOSCOPY;  Service: Endoscopy;  Laterality: N/A;  . IRRIGATION AND DEBRIDEMENT HEMATOMA Right 07/13/2018   Procedure: IRRIGATION AND DEBRIDEMENT HEMATOMA-RIGHT SHIN;  Surgeon: Herbert Pun, MD;  Location: [facility] ORS;  Service: General;  Laterality: Right;  . KYPHOPLASTY N/A 05/02/2019   Procedure: L1 KYPHOPLASTY;  Surgeon: Hessie Knows, MD;  Location: [facility] ORS;  Service: Orthopedics;  Laterality: N/A;  . LUMBAR [place]    . SPLENECTOMY, PARTIAL    . TOTAL ABDOMINAL HYSTERECTOMY      FAMILY HISTORY Family History  Problem Relation Age of Onset  . Stroke Mother     GYNECOLOGIC HISTORY:  No LMP recorded. Patient is postmenopausal.     ADVANCED DIRECTIVES:    HEALTH MAINTENANCE: Social History   Tobacco Use  . Smoking status: Never Smoker  . Smokeless tobacco: Never Used  Substance Use Topics  . Alcohol use: No    Alcohol/week: 0.0 standard drinks  . Drug use: No     Allergies  Allergen Reactions  . Aspirin     Upset stomach  . Diazepam Itching    sneezing  . Other  seasonal allergies  . Tetanus Toxoids     Localized superficial swelling of skin  . Morphine Itching and Rash    Current Outpatient Medications  Medication Sig Dispense Refill  . acetaminophen (TYLENOL) 325 MG tablet Take 2 tablets (650 mg total) by mouth every 6 (six) hours as needed for mild pain (or Fever >/= 101). 100 tablet 0  . albuterol (PROVENTIL HFA;VENTOLIN HFA) 108 (90  BASE) MCG/ACT inhaler Inhale 2 puffs into the lungs every 6 (six) hours as needed for wheezing or shortness of breath.    . ALPRAZolam (XANAX) 0.5 MG tablet Take 1 tablet (0.5 mg total) by mouth 3 (three) times daily as needed for anxiety. 20 tablet 0  . buPROPion (WELLBUTRIN XL) 150 MG 24 hr tablet Take 150 mg by mouth daily.    . busPIRone (BUSPAR) 15 MG tablet Take 15 mg by mouth daily.     . Calcium Carbonate-Vit D-Min (CALCIUM 600+D PLUS MINERALS) 600-400 MG-UNIT TABS Take 1 tablet by mouth daily.     . Carboxymethylcellul-Glycerin (LUBRICATING EYE DROPS OP) Place 1 drop into both eyes daily as needed (dry eyes).    . cholecalciferol (VITAMIN D3) 25 MCG (1000 UT) tablet Take 1,000 Units by mouth 2 (two) times a day.    . citalopram (CELEXA) 40 MG tablet Take 40 mg by mouth daily.    . Cyanocobalamin (RA VITAMIN B-12 TR) 1000 MCG TBCR Take 1,000 mcg by mouth daily.     . cyclobenzaprine (FLEXERIL) 5 MG tablet Take 5 mg by mouth 3 (three) times daily as needed for muscle spasms.    Marland Kitchen donepezil (ARICEPT) 10 MG tablet Take 10 mg by mouth at bedtime.     . fluticasone (FLONASE) 50 MCG/ACT nasal spray Place 2 sprays into both nostrils daily as needed for allergies.     . Fluticasone-Salmeterol (ADVAIR) 250-50 MCG/DOSE AEPB Inhale 1 puff into the lungs daily as needed (shortness of breath).     . gabapentin (NEURONTIN) 300 MG capsule Take 300 mg by mouth 4 (four) times daily.    Marland Kitchen HYDROcodone-acetaminophen (NORCO) 5-325 MG tablet Take 1 tablet by mouth every 6 (six) hours as needed for moderate pain. 30 tablet 0  . levothyroxine (SYNTHROID, LEVOTHROID) 50 MCG tablet Take 50 mcg by mouth daily before breakfast. Take 30 to 60 minutes before breakfast.    . memantine (NAMENDA) 10 MG tablet Take 10 mg by mouth daily.     . montelukast (SINGULAIR) 10 MG tablet Take 10 mg by mouth at bedtime.     . Multiple Vitamin (MULTI-VITAMIN) tablet Take 1 tablet by mouth daily.     . Multiple Vitamins-Minerals  (PRESERVISION AREDS 2) CAPS Take 1 capsule by mouth 2 (two) times a day.    Marland Kitchen NARCAN 4 MG/0.1ML LIQD nasal spray kit Place 1 spray into the nose once.     Marland Kitchen oxybutynin (DITROPAN) 5 MG tablet Take 5 mg by mouth 3 (three) times daily.    . pantoprazole (PROTONIX) 20 MG tablet Take 20 mg by mouth 2 (two) times daily.     . pravastatin (PRAVACHOL) 20 MG tablet Take 20 mg by mouth daily.    . traMADol (ULTRAM) 50 MG tablet Take 50 mg by mouth 2 (two) times daily.   5  . vitamin C (ASCORBIC ACID) 500 MG tablet Take 500 mg by mouth daily.    . vitamin E (E-400) 400 UNIT capsule Take 400 Units by mouth daily.    . ranitidine (ZANTAC) 300  MG tablet Take 300 mg by mouth at bedtime.      No current facility-administered medications for this visit.     OBJECTIVE: BP (!) 144/75   Pulse 62   Temp 98 F (36.7 C)   Resp 16   Wt 135 lb 3.2 oz (61.3 kg)   BMI 21.82 kg/m    Body mass index is 21.82 kg/m.    ECOG FS:1 - Symptomatic but completely ambulatory  General: Well-developed, well-nourished, no acute distress. Eyes: Pink conjunctiva, anicteric sclera. HEENT: Normocephalic, moist mucous membranes. Lungs: Clear to auscultation bilaterally. Heart: Regular rate and rhythm. No rubs, murmurs, or gallops. Abdomen: Soft, nontender, nondistended. No organomegaly noted, normoactive bowel sounds. Musculoskeletal: No edema, cyanosis, or clubbing. Neuro: Alert, answering all questions appropriately. Cranial nerves grossly intact. Skin: No rashes or petechiae noted. Psych: Normal affect.  LAB RESULTS:  Orders Only on 06/02/2019  Component Date Value Ref Range Status  . WBC 06/02/2019 13.7* 4.0 - 10.5 K/uL Final   Comment: REPEATED TO VERIFY [facility] CRITICAL VALUE PROTOCOL   . RBC 06/02/2019 4.26  3.87 - 5.11 MIL/uL Final  . Hemoglobin 06/02/2019 11.9* 12.0 - 15.0 g/dL Final  . HCT 06/02/2019 38.2  36.0 - 46.0 % Final  . MCV 06/02/2019 89.7  80.0 - 100.0 fL Final  . MCH 06/02/2019 27.9  26.0  - 34.0 pg Final  . MCHC 06/02/2019 31.2  30.0 - 36.0 g/dL Final  . RDW 06/02/2019 18.3* 11.5 - 15.5 % Final  . Platelets 06/02/2019 336  150 - 400 K/uL Final   Comment: REPEATED TO VERIFY Platelet count consistent with citrate. PLATELET COUNT CONFIRMED BY SMEAR   . nRBC 06/02/2019 0.0  0.0 - 0.2 % Final  . Neutrophils Relative % 06/02/2019 65  % Final  . Neutro Abs 06/02/2019 9.0* 1.7 - 7.7 K/uL Final  . Lymphocytes Relative 06/02/2019 23  % Final  . Lymphs Abs 06/02/2019 3.1  0.7 - 4.0 K/uL Final  . Monocytes Relative 06/02/2019 8  % Final  . Monocytes Absolute 06/02/2019 1.2* 0.1 - 1.0 K/uL Final  . Eosinophils Relative 06/02/2019 2  % Final  . Eosinophils Absolute 06/02/2019 0.3  0.0 - 0.5 K/uL Final  . Basophils Relative 06/02/2019 1  % Final  . Basophils Absolute 06/02/2019 0.1  0.0 - 0.1 K/uL Final  . WBC Morphology 06/02/2019 MORPHOLOGY UNREMARKABLE   Final  . RBC Morphology 06/02/2019 MORPHOLOGY UNREMARKABLE   Final  . Smear Review 06/02/2019 PLATELETS APPEAR ADEQUATE   Final   PLATELET COUNT CONFIRMED BY SMEAR  . Immature Granulocytes 06/02/2019 1  % Final  . Abs Immature Granulocytes 06/02/2019 0.10* 0.00 - 0.07 K/uL Final  . Giant PLTs 06/02/2019 PRESENT   Final   Performed at [facility], [address] [place], [place] [address]    STUDIES: No results found.  ASSESSMENT:  Chronic refractory ITP  PLAN:   1. Chronic refractory ITP: Patient had a poor response to Prednisone, WinRho, and Rituxan. She is status post splenectomy in 2007.  She received IVIG and Nplate she received while at [facility] with significant improvement of her platelets.  [facility] has recommended weekly Nplate, but given this patient's comorbidities, weekly clinic visits would be difficult.  IVIG also works, but because of the visitor restrictions during COVID-19 patient likely would not do well sitting in the clinic alone all day for multiple days at a time.  Patient is currently receiving the max dose  of Nplate at 10 mcg/kg.  Can also consider Promacta 12.42m or Tavalisse 100 mg BID if needed.  Her platelet count is 336 today.  She does not require additional treatment.  Given the significant increase in platelet count from Nplate injection 2 weeks ago, we may be able to decrease her dose slightly.  Return to clinic in 2 weeks for laboratory work only and then in 4 weeks for laboratory work, further evaluation and continuation of treatment if needed.   2. Weakness and fatigue: Chronic and unchanged. 3. Depression/dementia: Patient is now at [employer].  Leg wound: Resolved.  Continue surgical dressings as directed. 5.  Anemia: Hemoglobin continues to improve and is now 11.9.  Patient expressed understanding and was in agreement with this plan. She also understands that She can call clinic at any time with any questions, concerns, or complaints.    TLloyd Huger MD   06/02/2019 11:06 AM

## 2019-06-02 NOTE — Progress Notes (Signed)
Patient does not offer any problems today.  

## 2019-06-05 DIAGNOSIS — S51011D Laceration without foreign body of right elbow, subsequent encounter: Secondary | ICD-10-CM | POA: Diagnosis not present

## 2019-06-05 DIAGNOSIS — W19XXXD Unspecified fall, subsequent encounter: Secondary | ICD-10-CM | POA: Diagnosis not present

## 2019-06-05 DIAGNOSIS — R296 Repeated falls: Secondary | ICD-10-CM | POA: Diagnosis not present

## 2019-06-05 DIAGNOSIS — S81811D Laceration without foreign body, right lower leg, subsequent encounter: Secondary | ICD-10-CM | POA: Diagnosis not present

## 2019-06-05 DIAGNOSIS — S71111D Laceration without foreign body, right thigh, subsequent encounter: Secondary | ICD-10-CM | POA: Diagnosis not present

## 2019-06-05 DIAGNOSIS — D693 Immune thrombocytopenic purpura: Secondary | ICD-10-CM | POA: Diagnosis not present

## 2019-06-05 DIAGNOSIS — S51012D Laceration without foreign body of left elbow, subsequent encounter: Secondary | ICD-10-CM | POA: Diagnosis not present

## 2019-06-05 DIAGNOSIS — S51811D Laceration without foreign body of right forearm, subsequent encounter: Secondary | ICD-10-CM | POA: Diagnosis not present

## 2019-06-05 DIAGNOSIS — Z4800 Encounter for change or removal of nonsurgical wound dressing: Secondary | ICD-10-CM | POA: Diagnosis not present

## 2019-06-14 ENCOUNTER — Other Ambulatory Visit: Payer: Self-pay

## 2019-06-15 ENCOUNTER — Inpatient Hospital Stay: Payer: Medicare HMO | Attending: Oncology

## 2019-06-15 ENCOUNTER — Other Ambulatory Visit: Payer: Self-pay

## 2019-06-15 DIAGNOSIS — R531 Weakness: Secondary | ICD-10-CM | POA: Diagnosis not present

## 2019-06-15 DIAGNOSIS — Z86711 Personal history of pulmonary embolism: Secondary | ICD-10-CM | POA: Insufficient documentation

## 2019-06-15 DIAGNOSIS — G309 Alzheimer's disease, unspecified: Secondary | ICD-10-CM | POA: Insufficient documentation

## 2019-06-15 DIAGNOSIS — R5383 Other fatigue: Secondary | ICD-10-CM | POA: Diagnosis not present

## 2019-06-15 DIAGNOSIS — F329 Major depressive disorder, single episode, unspecified: Secondary | ICD-10-CM | POA: Insufficient documentation

## 2019-06-15 DIAGNOSIS — Z79899 Other long term (current) drug therapy: Secondary | ICD-10-CM | POA: Insufficient documentation

## 2019-06-15 DIAGNOSIS — D693 Immune thrombocytopenic purpura: Secondary | ICD-10-CM | POA: Insufficient documentation

## 2019-06-15 LAB — CBC WITH DIFFERENTIAL/PLATELET
Abs Immature Granulocytes: 0.02 10*3/uL (ref 0.00–0.07)
Basophils Absolute: 0.1 10*3/uL (ref 0.0–0.1)
Basophils Relative: 1 %
Eosinophils Absolute: 0.1 10*3/uL (ref 0.0–0.5)
Eosinophils Relative: 1 %
HCT: 39.9 % (ref 36.0–46.0)
Hemoglobin: 12.5 g/dL (ref 12.0–15.0)
Immature Granulocytes: 0 %
Lymphocytes Relative: 37 %
Lymphs Abs: 2.8 10*3/uL (ref 0.7–4.0)
MCH: 28.6 pg (ref 26.0–34.0)
MCHC: 31.3 g/dL (ref 30.0–36.0)
MCV: 91.3 fL (ref 80.0–100.0)
Monocytes Absolute: 0.6 10*3/uL (ref 0.1–1.0)
Monocytes Relative: 8 %
Neutro Abs: 4 10*3/uL (ref 1.7–7.7)
Neutrophils Relative %: 53 %
Platelets: 27 10*3/uL — CL (ref 150–400)
RBC: 4.37 MIL/uL (ref 3.87–5.11)
RDW: 15.7 % — ABNORMAL HIGH (ref 11.5–15.5)
Smear Review: DECREASED
WBC: 7.9 10*3/uL (ref 4.0–10.5)
nRBC: 0 % (ref 0.0–0.2)

## 2019-06-16 NOTE — Progress Notes (Signed)
[place]  Telephone:(336) [phone]  Fax:(336) [phone]     Ariana [name] DOB: 1933-02-18  MR#: [id number]  FSE#:[id number]  Patient Care Team: Idelle Crouch, MD as PCP - General (Internal Medicine)  CHIEF COMPLAINT: Chronic refractory ITP  INTERVAL HISTORY: Patient returns to clinic today for further evaluation and consideration of Nplate.  She continues to feel well and remains at her baseline.  She denies any recent falls or injuries. She continues to have increased confusion and is a poor historian.  She has no neurologic complaints.  She denies any recent fevers or illnesses.  She denies any chest pain, shortness of breath, cough, or hemoptysis.  She denies any nausea, vomiting, constipation, or diarrhea.  She has no melena or hematochezia.  She has no urinary complaints.  Patient offers no specific complaints today.  REVIEW OF SYSTEMS:   Review of Systems  Constitutional: Negative.  Negative for fever, malaise/fatigue and weight loss.  HENT: Negative for congestion.   Respiratory: Negative.  Negative for cough, hemoptysis and shortness of breath.   Cardiovascular: Negative.  Negative for chest pain and leg swelling.  Gastrointestinal: Negative.  Negative for abdominal pain, blood in stool, diarrhea, melena and nausea.  Genitourinary: Negative.  Negative for dysuria and hematuria.  Musculoskeletal: Negative.  Negative for back pain and falls.  Skin: Negative.  Negative for rash.  Neurological: Negative.  Negative for sensory change, focal weakness and weakness.  Endo/Heme/Allergies: Bruises/bleeds easily.  Psychiatric/Behavioral: Positive for memory loss. Negative for depression. The patient is not nervous/anxious.     As per HPI. Otherwise, a complete review of systems is negative.  PAST MEDICAL HISTORY: Past Medical History:  Diagnosis Date  . Alzheimer disease ([place])   . Anemia   . Anxiety   . Asthma   . Chronic back pain   . Depression    . Depression   . GERD (gastroesophageal reflux disease)   . Hiatal hernia   . Hypercholesteremia   . ITP (idiopathic thrombocytopenic purpura)   . Osteoarthritis   . Osteoporosis   . Pulmonary emboli ([place])   . Restless legs     PAST SURGICAL HISTORY: Past Surgical History:  Procedure Laterality Date  . CARPAL TUNNEL RELEASE    . ESOPHAGOGASTRODUODENOSCOPY (EGD) WITH PROPOFOL N/A 09/10/2015   Procedure: ESOPHAGOGASTRODUODENOSCOPY (EGD) WITH PROPOFOL;  Surgeon: Lollie Sails, MD;  Location: [facility] ENDOSCOPY;  Service: Endoscopy;  Laterality: N/A;  . IRRIGATION AND DEBRIDEMENT HEMATOMA Right 07/13/2018   Procedure: IRRIGATION AND DEBRIDEMENT HEMATOMA-RIGHT SHIN;  Surgeon: Herbert Pun, MD;  Location: [facility] ORS;  Service: General;  Laterality: Right;  . KYPHOPLASTY N/A 05/02/2019   Procedure: L1 KYPHOPLASTY;  Surgeon: Hessie Knows, MD;  Location: [facility] ORS;  Service: Orthopedics;  Laterality: N/A;  . LUMBAR [place]    . SPLENECTOMY, PARTIAL    . TOTAL ABDOMINAL HYSTERECTOMY      FAMILY HISTORY Family History  Problem Relation Age of Onset  . Stroke Mother     GYNECOLOGIC HISTORY:  No LMP recorded. Patient is postmenopausal.     ADVANCED DIRECTIVES:    HEALTH MAINTENANCE: Social History   Tobacco Use  . Smoking status: Never Smoker  . Smokeless tobacco: Never Used  Substance Use Topics  . Alcohol use: No    Alcohol/week: 0.0 standard drinks  . Drug use: No     Allergies  Allergen Reactions  . Aspirin     Upset stomach  . Diazepam Itching    sneezing  . Other  seasonal allergies  . Tetanus Toxoids     Localized superficial swelling of skin  . Morphine Itching and Rash    Current Outpatient Medications  Medication Sig Dispense Refill  . acetaminophen (TYLENOL) 325 MG tablet Take 2 tablets (650 mg total) by mouth every 6 (six) hours as needed for mild pain (or Fever >/= 101). 100 tablet 0  . albuterol (PROVENTIL HFA;VENTOLIN HFA) 108 (90  BASE) MCG/ACT inhaler Inhale 2 puffs into the lungs every 6 (six) hours as needed for wheezing or shortness of breath.    . ALPRAZolam (XANAX) 0.5 MG tablet Take 1 tablet (0.5 mg total) by mouth 3 (three) times daily as needed for anxiety. 20 tablet 0  . buPROPion (WELLBUTRIN XL) 150 MG 24 hr tablet Take 150 mg by mouth daily.    . busPIRone (BUSPAR) 15 MG tablet Take 15 mg by mouth daily.     . Calcium Carbonate-Vit D-Min (CALCIUM 600+D PLUS MINERALS) 600-400 MG-UNIT TABS Take 1 tablet by mouth daily.     . Carboxymethylcellul-Glycerin (LUBRICATING EYE DROPS OP) Place 1 drop into both eyes daily as needed (dry eyes).    . cholecalciferol (VITAMIN D3) 25 MCG (1000 UT) tablet Take 1,000 Units by mouth 2 (two) times a day.    . citalopram (CELEXA) 40 MG tablet Take 40 mg by mouth daily.    . Cyanocobalamin (RA VITAMIN B-12 TR) 1000 MCG TBCR Take 1,000 mcg by mouth daily.     . cyclobenzaprine (FLEXERIL) 5 MG tablet Take 5 mg by mouth 3 (three) times daily as needed for muscle spasms.    Marland Kitchen donepezil (ARICEPT) 10 MG tablet Take 10 mg by mouth at bedtime.     . fluticasone (FLONASE) 50 MCG/ACT nasal spray Place 2 sprays into both nostrils daily as needed for allergies.     . Fluticasone-Salmeterol (ADVAIR) 250-50 MCG/DOSE AEPB Inhale 1 puff into the lungs daily as needed (shortness of breath).     . gabapentin (NEURONTIN) 300 MG capsule Take 300 mg by mouth 4 (four) times daily.    Marland Kitchen HYDROcodone-acetaminophen (NORCO) 5-325 MG tablet Take 1 tablet by mouth every 6 (six) hours as needed for moderate pain. 30 tablet 0  . levothyroxine (SYNTHROID, LEVOTHROID) 50 MCG tablet Take 50 mcg by mouth daily before breakfast. Take 30 to 60 minutes before breakfast.    . memantine (NAMENDA) 10 MG tablet Take 10 mg by mouth daily.     . montelukast (SINGULAIR) 10 MG tablet Take 10 mg by mouth at bedtime.     . Multiple Vitamin (MULTI-VITAMIN) tablet Take 1 tablet by mouth daily.     . Multiple Vitamins-Minerals  (PRESERVISION AREDS 2) CAPS Take 1 capsule by mouth 2 (two) times a day.    Marland Kitchen NARCAN 4 MG/0.1ML LIQD nasal spray kit Place 1 spray into the nose once.     Marland Kitchen oxybutynin (DITROPAN) 5 MG tablet Take 5 mg by mouth 3 (three) times daily.    . pantoprazole (PROTONIX) 20 MG tablet Take 20 mg by mouth 2 (two) times daily.     . pravastatin (PRAVACHOL) 20 MG tablet Take 20 mg by mouth daily.    . ranitidine (ZANTAC) 300 MG tablet Take 300 mg by mouth at bedtime.     . traMADol (ULTRAM) 50 MG tablet Take 50 mg by mouth 2 (two) times daily.   5  . vitamin C (ASCORBIC ACID) 500 MG tablet Take 500 mg by mouth daily.    . vitamin E (  E-400) 400 UNIT capsule Take 400 Units by mouth daily.     No current facility-administered medications for this visit.     OBJECTIVE: BP 124/73   Pulse (!) 59   Resp 18   Wt 136 lb (61.7 kg)   BMI 21.95 kg/m    Body mass index is 21.95 kg/m.    ECOG FS:1 - Symptomatic but completely ambulatory  General: Well-developed, well-nourished, no acute distress. Eyes: Pink conjunctiva, anicteric sclera. HEENT: Normocephalic, moist mucous membranes. Lungs: Clear to auscultation bilaterally. Heart: Regular rate and rhythm. No rubs, murmurs, or gallops. Abdomen: Soft, nontender, nondistended. No organomegaly noted, normoactive bowel sounds. Musculoskeletal: No edema, cyanosis, or clubbing. Neuro: Alert, answering all questions appropriately. Cranial nerves grossly intact. Skin: No rashes or petechiae noted. Psych: Normal affect.  LAB RESULTS:  Orders Only on 06/29/2019  Component Date Value Ref Range Status  . WBC 06/29/2019 8.0  4.0 - 10.5 K/uL Final  . RBC 06/29/2019 4.23  3.87 - 5.11 MIL/uL Final  . Hemoglobin 06/29/2019 12.2  12.0 - 15.0 g/dL Final  . HCT 06/29/2019 38.5  36.0 - 46.0 % Final  . MCV 06/29/2019 91.0  80.0 - 100.0 fL Final  . MCH 06/29/2019 28.8  26.0 - 34.0 pg Final  . MCHC 06/29/2019 31.7  30.0 - 36.0 g/dL Final  . RDW 06/29/2019 15.7* 11.5 - 15.5 %  Final  . Platelets 06/29/2019 33* 150 - 400 K/uL Final   Comment: REPEATED TO VERIFY SPECIMEN CHECKED FOR CLOTS Immature Platelet Fraction may be clinically indicated, consider ordering this additional test [id number]   . nRBC 06/29/2019 0.0  0.0 - 0.2 % Final  . Neutrophils Relative % 06/29/2019 54  % Final  . Neutro Abs 06/29/2019 4.4  1.7 - 7.7 K/uL Final  . Lymphocytes Relative 06/29/2019 34  % Final  . Lymphs Abs 06/29/2019 2.7  0.7 - 4.0 K/uL Final  . Monocytes Relative 06/29/2019 8  % Final  . Monocytes Absolute 06/29/2019 0.7  0.1 - 1.0 K/uL Final  . Eosinophils Relative 06/29/2019 3  % Final  . Eosinophils Absolute 06/29/2019 0.2  0.0 - 0.5 K/uL Final  . Basophils Relative 06/29/2019 1  % Final  . Basophils Absolute 06/29/2019 0.1  0.0 - 0.1 K/uL Final  . Immature Granulocytes 06/29/2019 0  % Final  . Abs Immature Granulocytes 06/29/2019 0.02  0.00 - 0.07 K/uL Final   Performed at [facility], [address] [place], [place] [address]    STUDIES: No results found.  ASSESSMENT:  Chronic refractory ITP  PLAN:   1. Chronic refractory ITP: Patient had a poor response to Prednisone, WinRho, and Rituxan. She is status post splenectomy in 2007.  She received IVIG and Nplate she received while at [facility] with significant improvement of her platelets.  [facility] has recommended weekly Nplate, but given this patient's comorbidities, weekly clinic visits would be difficult.  IVIG also works, but because of the visitor restrictions during COVID-19 patient likely would not do well sitting in the clinic alone all day for multiple days at a time.  Patient is currently receiving the max dose of Nplate at 10 mcg/kg.  Can also consider Promacta 12.'5mg'$  or Tavalisse 100 mg BID if needed.  Her platelet count is decreased to 33, therefore will proceed with treatment today.  Return to clinic in 4 weeks for further evaluation and continuation of treatment.  2. Weakness and fatigue: Chronic and  unchanged. 3. Depression/dementia: Patient is now at [employer]. 4.  Leg wound: Resolved.  Continue surgical dressings as directed. 5.  Anemia: Resolved.  Patient expressed understanding and was in agreement with this plan. She also understands that She can call clinic at any time with any questions, concerns, or complaints.    Lloyd Huger, MD   06/29/2019 4:20 PM

## 2019-06-28 ENCOUNTER — Other Ambulatory Visit: Payer: Self-pay

## 2019-06-28 NOTE — Progress Notes (Signed)
Pre-visit assessment for [facility] clinic appointment on 06/29/2019 with Dr. Grayland Ormond.  The pt's husband provided assessment information.  No concerns identified.  Pt continues to have chronic back pain that is controlled with medication.

## 2019-06-29 ENCOUNTER — Other Ambulatory Visit: Payer: Self-pay

## 2019-06-29 ENCOUNTER — Inpatient Hospital Stay (HOSPITAL_BASED_OUTPATIENT_CLINIC_OR_DEPARTMENT_OTHER): Payer: Medicare HMO | Admitting: Oncology

## 2019-06-29 ENCOUNTER — Inpatient Hospital Stay: Payer: Medicare HMO

## 2019-06-29 ENCOUNTER — Encounter: Payer: Self-pay | Admitting: Oncology

## 2019-06-29 VITALS — BP 124/73 | HR 59 | Resp 18 | Wt 136.0 lb

## 2019-06-29 DIAGNOSIS — D693 Immune thrombocytopenic purpura: Secondary | ICD-10-CM | POA: Diagnosis not present

## 2019-06-29 DIAGNOSIS — R5383 Other fatigue: Secondary | ICD-10-CM | POA: Diagnosis not present

## 2019-06-29 DIAGNOSIS — Z79899 Other long term (current) drug therapy: Secondary | ICD-10-CM | POA: Diagnosis not present

## 2019-06-29 DIAGNOSIS — Z86711 Personal history of pulmonary embolism: Secondary | ICD-10-CM | POA: Diagnosis not present

## 2019-06-29 DIAGNOSIS — G309 Alzheimer's disease, unspecified: Secondary | ICD-10-CM | POA: Diagnosis not present

## 2019-06-29 DIAGNOSIS — F329 Major depressive disorder, single episode, unspecified: Secondary | ICD-10-CM | POA: Diagnosis not present

## 2019-06-29 DIAGNOSIS — R531 Weakness: Secondary | ICD-10-CM | POA: Diagnosis not present

## 2019-06-29 LAB — CBC WITH DIFFERENTIAL/PLATELET
Abs Immature Granulocytes: 0.02 10*3/uL (ref 0.00–0.07)
Basophils Absolute: 0.1 10*3/uL (ref 0.0–0.1)
Basophils Relative: 1 %
Eosinophils Absolute: 0.2 10*3/uL (ref 0.0–0.5)
Eosinophils Relative: 3 %
HCT: 38.5 % (ref 36.0–46.0)
Hemoglobin: 12.2 g/dL (ref 12.0–15.0)
Immature Granulocytes: 0 %
Lymphocytes Relative: 34 %
Lymphs Abs: 2.7 10*3/uL (ref 0.7–4.0)
MCH: 28.8 pg (ref 26.0–34.0)
MCHC: 31.7 g/dL (ref 30.0–36.0)
MCV: 91 fL (ref 80.0–100.0)
Monocytes Absolute: 0.7 10*3/uL (ref 0.1–1.0)
Monocytes Relative: 8 %
Neutro Abs: 4.4 10*3/uL (ref 1.7–7.7)
Neutrophils Relative %: 54 %
Platelets: 33 10*3/uL — ABNORMAL LOW (ref 150–400)
RBC: 4.23 MIL/uL (ref 3.87–5.11)
RDW: 15.7 % — ABNORMAL HIGH (ref 11.5–15.5)
WBC: 8 10*3/uL (ref 4.0–10.5)
nRBC: 0 % (ref 0.0–0.2)

## 2019-06-29 MED ORDER — ROMIPLOSTIM INJECTION 500 MCG
10.0000 ug/kg | Freq: Once | SUBCUTANEOUS | Status: AC
  Administered 2019-06-29: 615 ug via SUBCUTANEOUS
  Filled 2019-06-29: qty 1.23

## 2019-07-10 ENCOUNTER — Other Ambulatory Visit: Payer: Self-pay

## 2019-07-10 ENCOUNTER — Encounter: Payer: Self-pay | Admitting: Emergency Medicine

## 2019-07-10 ENCOUNTER — Emergency Department: Payer: Medicare HMO

## 2019-07-10 ENCOUNTER — Inpatient Hospital Stay
Admission: EM | Admit: 2019-07-10 | Discharge: 2019-07-12 | DRG: 193 | Disposition: A | Discharge status: Home Health | Payer: Medicare HMO | Attending: Internal Medicine | Admitting: Internal Medicine

## 2019-07-10 DIAGNOSIS — K449 Diaphragmatic hernia without obstruction or gangrene: Secondary | ICD-10-CM | POA: Diagnosis not present

## 2019-07-10 DIAGNOSIS — M549 Dorsalgia, unspecified: Secondary | ICD-10-CM | POA: Diagnosis present

## 2019-07-10 DIAGNOSIS — Z79899 Other long term (current) drug therapy: Secondary | ICD-10-CM | POA: Diagnosis not present

## 2019-07-10 DIAGNOSIS — Z20828 Contact with and (suspected) exposure to other viral communicable diseases: Secondary | ICD-10-CM | POA: Diagnosis not present

## 2019-07-10 DIAGNOSIS — E039 Hypothyroidism, unspecified: Secondary | ICD-10-CM | POA: Diagnosis present

## 2019-07-10 DIAGNOSIS — R410 Disorientation, unspecified: Secondary | ICD-10-CM | POA: Diagnosis not present

## 2019-07-10 DIAGNOSIS — F329 Major depressive disorder, single episode, unspecified: Secondary | ICD-10-CM | POA: Diagnosis present

## 2019-07-10 DIAGNOSIS — G4733 Obstructive sleep apnea (adult) (pediatric): Secondary | ICD-10-CM | POA: Diagnosis present

## 2019-07-10 DIAGNOSIS — D693 Immune thrombocytopenic purpura: Secondary | ICD-10-CM | POA: Diagnosis not present

## 2019-07-10 DIAGNOSIS — Z79890 Hormone replacement therapy (postmenopausal): Secondary | ICD-10-CM

## 2019-07-10 DIAGNOSIS — E785 Hyperlipidemia, unspecified: Secondary | ICD-10-CM | POA: Diagnosis present

## 2019-07-10 DIAGNOSIS — E7800 Pure hypercholesterolemia, unspecified: Secondary | ICD-10-CM | POA: Diagnosis present

## 2019-07-10 DIAGNOSIS — J45909 Unspecified asthma, uncomplicated: Secondary | ICD-10-CM | POA: Diagnosis present

## 2019-07-10 DIAGNOSIS — G309 Alzheimer's disease, unspecified: Secondary | ICD-10-CM | POA: Diagnosis present

## 2019-07-10 DIAGNOSIS — G2581 Restless legs syndrome: Secondary | ICD-10-CM | POA: Diagnosis present

## 2019-07-10 DIAGNOSIS — R531 Weakness: Secondary | ICD-10-CM | POA: Diagnosis not present

## 2019-07-10 DIAGNOSIS — I361 Nonrheumatic tricuspid (valve) insufficiency: Secondary | ICD-10-CM | POA: Diagnosis not present

## 2019-07-10 DIAGNOSIS — J189 Pneumonia, unspecified organism: Principal | ICD-10-CM | POA: Diagnosis present

## 2019-07-10 DIAGNOSIS — I340 Nonrheumatic mitral (valve) insufficiency: Secondary | ICD-10-CM | POA: Diagnosis not present

## 2019-07-10 DIAGNOSIS — Z823 Family history of stroke: Secondary | ICD-10-CM

## 2019-07-10 DIAGNOSIS — Z886 Allergy status to analgesic agent status: Secondary | ICD-10-CM

## 2019-07-10 DIAGNOSIS — R404 Transient alteration of awareness: Secondary | ICD-10-CM | POA: Diagnosis not present

## 2019-07-10 DIAGNOSIS — G894 Chronic pain syndrome: Secondary | ICD-10-CM | POA: Diagnosis not present

## 2019-07-10 DIAGNOSIS — M810 Age-related osteoporosis without current pathological fracture: Secondary | ICD-10-CM | POA: Diagnosis present

## 2019-07-10 DIAGNOSIS — I959 Hypotension, unspecified: Secondary | ICD-10-CM | POA: Diagnosis not present

## 2019-07-10 DIAGNOSIS — Z86711 Personal history of pulmonary embolism: Secondary | ICD-10-CM

## 2019-07-10 DIAGNOSIS — G4730 Sleep apnea, unspecified: Secondary | ICD-10-CM | POA: Diagnosis present

## 2019-07-10 DIAGNOSIS — F419 Anxiety disorder, unspecified: Secondary | ICD-10-CM | POA: Diagnosis present

## 2019-07-10 DIAGNOSIS — Z887 Allergy status to serum and vaccine status: Secondary | ICD-10-CM

## 2019-07-10 DIAGNOSIS — R918 Other nonspecific abnormal finding of lung field: Secondary | ICD-10-CM | POA: Diagnosis not present

## 2019-07-10 DIAGNOSIS — R9431 Abnormal electrocardiogram [ECG] [EKG]: Secondary | ICD-10-CM | POA: Diagnosis not present

## 2019-07-10 DIAGNOSIS — K219 Gastro-esophageal reflux disease without esophagitis: Secondary | ICD-10-CM | POA: Diagnosis not present

## 2019-07-10 DIAGNOSIS — R4182 Altered mental status, unspecified: Secondary | ICD-10-CM | POA: Diagnosis not present

## 2019-07-10 DIAGNOSIS — J168 Pneumonia due to other specified infectious organisms: Secondary | ICD-10-CM | POA: Diagnosis not present

## 2019-07-10 DIAGNOSIS — F0280 Dementia in other diseases classified elsewhere without behavioral disturbance: Secondary | ICD-10-CM | POA: Diagnosis present

## 2019-07-10 DIAGNOSIS — I10 Essential (primary) hypertension: Secondary | ICD-10-CM | POA: Diagnosis present

## 2019-07-10 DIAGNOSIS — M1990 Unspecified osteoarthritis, unspecified site: Secondary | ICD-10-CM | POA: Diagnosis present

## 2019-07-10 DIAGNOSIS — Z885 Allergy status to narcotic agent status: Secondary | ICD-10-CM

## 2019-07-10 DIAGNOSIS — Z888 Allergy status to other drugs, medicaments and biological substances status: Secondary | ICD-10-CM

## 2019-07-10 DIAGNOSIS — R0902 Hypoxemia: Secondary | ICD-10-CM | POA: Diagnosis not present

## 2019-07-10 DIAGNOSIS — G9341 Metabolic encephalopathy: Secondary | ICD-10-CM | POA: Diagnosis not present

## 2019-07-10 LAB — URINALYSIS, COMPLETE (UACMP) WITH MICROSCOPIC
Bacteria, UA: NONE SEEN
Bilirubin Urine: NEGATIVE
Glucose, UA: NEGATIVE mg/dL
Hgb urine dipstick: NEGATIVE
Ketones, ur: NEGATIVE mg/dL
Leukocytes,Ua: NEGATIVE
Nitrite: NEGATIVE
Protein, ur: NEGATIVE mg/dL
Specific Gravity, Urine: 1.013 (ref 1.005–1.030)
Squamous Epithelial / HPF: NONE SEEN (ref 0–5)
pH: 6 (ref 5.0–8.0)

## 2019-07-10 LAB — LACTIC ACID, PLASMA: Lactic Acid, Venous: 1 mmol/L (ref 0.5–1.9)

## 2019-07-10 LAB — COMPREHENSIVE METABOLIC PANEL
ALT: 38 U/L (ref 0–44)
AST: 76 U/L — ABNORMAL HIGH (ref 15–41)
Albumin: 3.5 g/dL (ref 3.5–5.0)
Alkaline Phosphatase: 85 U/L (ref 38–126)
Anion gap: 8 (ref 5–15)
BUN: 25 mg/dL — ABNORMAL HIGH (ref 8–23)
CO2: 27 mmol/L (ref 22–32)
Calcium: 9.4 mg/dL (ref 8.9–10.3)
Chloride: 102 mmol/L (ref 98–111)
Creatinine, Ser: 0.9 mg/dL (ref 0.44–1.00)
GFR calc Af Amer: >60 mL/min (ref >60)
GFR calc non Af Amer: 59 mL/min — ABNORMAL LOW (ref >60)
Glucose, Bld: 98 mg/dL (ref 70–99)
Potassium: 4.1 mmol/L (ref 3.5–5.1)
Sodium: 137 mmol/L (ref 135–145)
Total Bilirubin: 0.6 mg/dL (ref 0.3–1.2)
Total Protein: 6.1 g/dL — ABNORMAL LOW (ref 6.5–8.1)

## 2019-07-10 LAB — CBC
HCT: 35.7 % — ABNORMAL LOW (ref 36.0–46.0)
Hemoglobin: 11.4 g/dL — ABNORMAL LOW (ref 12.0–15.0)
MCH: 28.7 pg (ref 26.0–34.0)
MCHC: 31.9 g/dL (ref 30.0–36.0)
MCV: 89.9 fL (ref 80.0–100.0)
Platelets: 282 10*3/uL (ref 150–400)
RBC: 3.97 MIL/uL (ref 3.87–5.11)
RDW: 16.7 % — ABNORMAL HIGH (ref 11.5–15.5)
WBC: 23.5 10*3/uL — ABNORMAL HIGH (ref 4.0–10.5)
nRBC: 0 % (ref 0.0–0.2)

## 2019-07-10 LAB — SARS CORONAVIRUS 2 BY RT PCR (HOSPITAL ORDER, PERFORMED IN ~~LOC~~ HOSPITAL LAB): SARS Coronavirus 2: NEGATIVE

## 2019-07-10 LAB — TROPONIN I (HIGH SENSITIVITY)
Troponin I (High Sensitivity): 13 ng/L (ref <18)
Troponin I (High Sensitivity): 13 ng/L (ref <18)

## 2019-07-10 MED ORDER — FLUTICASONE PROPIONATE 50 MCG/ACT NA SUSP
2.0000 | Freq: Every day | NASAL | Status: DC | PRN
  Filled 2019-07-10: qty 16

## 2019-07-10 MED ORDER — CYCLOBENZAPRINE HCL 10 MG PO TABS: 5.0000 mg | ORAL_TABLET | Freq: Three times a day (TID) | ORAL | Status: DC | PRN

## 2019-07-10 MED ORDER — ENOXAPARIN SODIUM 40 MG/0.4ML ~~LOC~~ SOLN
40.0000 mg | Freq: Every day | SUBCUTANEOUS | Status: DC
  Administered 2019-07-11 (×2): 40 mg via SUBCUTANEOUS
  Filled 2019-07-10 (×2): qty 0.4

## 2019-07-10 MED ORDER — VITAMIN D 25 MCG (1000 UNIT) PO TABS
1000.0000 [IU] | ORAL_TABLET | Freq: Every day | ORAL | Status: DC
  Administered 2019-07-11 – 2019-07-12 (×2): 1000 [IU] via ORAL
  Filled 2019-07-10 (×2): qty 1

## 2019-07-10 MED ORDER — PANTOPRAZOLE SODIUM 20 MG PO TBEC
20.0000 mg | DELAYED_RELEASE_TABLET | Freq: Two times a day (BID) | ORAL | Status: DC
  Administered 2019-07-11 – 2019-07-12 (×3): 20 mg via ORAL
  Filled 2019-07-10 (×4): qty 1

## 2019-07-10 MED ORDER — ALPRAZOLAM 0.5 MG PO TABS
0.5000 mg | ORAL_TABLET | Freq: Three times a day (TID) | ORAL | Status: DC | PRN
  Administered 2019-07-11: 0.5 mg via ORAL
  Filled 2019-07-10: qty 1

## 2019-07-10 MED ORDER — SODIUM CHLORIDE 0.9 % IV BOLUS
1000.0000 mL | Freq: Once | INTRAVENOUS | Status: AC
  Administered 2019-07-10: 1000 mL via INTRAVENOUS

## 2019-07-10 MED ORDER — IPRATROPIUM-ALBUTEROL 0.5-2.5 (3) MG/3ML IN SOLN
3.0000 mL | Freq: Four times a day (QID) | RESPIRATORY_TRACT | Status: DC
  Administered 2019-07-11 – 2019-07-12 (×6): 3 mL via RESPIRATORY_TRACT
  Filled 2019-07-10 (×8): qty 3

## 2019-07-10 MED ORDER — SODIUM CHLORIDE 0.9 % IV SOLN
500.0000 mg | INTRAVENOUS | Status: DC
  Administered 2019-07-11: 500 mg via INTRAVENOUS
  Filled 2019-07-10 (×3): qty 500

## 2019-07-10 MED ORDER — NALOXONE HCL 4 MG/0.1ML NA LIQD: 1.0000 | Freq: Once | NASAL | Status: DC

## 2019-07-10 MED ORDER — SODIUM CHLORIDE 0.9 % IV SOLN
INTRAVENOUS | Status: DC
  Administered 2019-07-11: 01:00:00 via INTRAVENOUS

## 2019-07-10 MED ORDER — CITALOPRAM HYDROBROMIDE 20 MG PO TABS
40.0000 mg | ORAL_TABLET | Freq: Every day | ORAL | Status: DC
  Administered 2019-07-11: 40 mg via ORAL
  Filled 2019-07-10: qty 2

## 2019-07-10 MED ORDER — MEMANTINE HCL 5 MG PO TABS
10.0000 mg | ORAL_TABLET | Freq: Every day | ORAL | Status: DC
  Administered 2019-07-11 – 2019-07-12 (×2): 10 mg via ORAL
  Filled 2019-07-10 (×2): qty 2

## 2019-07-10 MED ORDER — SODIUM CHLORIDE 0.9 % IV SOLN
500.0000 mg | Freq: Once | INTRAVENOUS | Status: AC
  Administered 2019-07-10: 500 mg via INTRAVENOUS
  Filled 2019-07-10: qty 500

## 2019-07-10 MED ORDER — PRAVASTATIN SODIUM 20 MG PO TABS
20.0000 mg | ORAL_TABLET | Freq: Every day | ORAL | Status: DC
  Administered 2019-07-11: 20 mg via ORAL
  Filled 2019-07-10: qty 1

## 2019-07-10 MED ORDER — MONTELUKAST SODIUM 10 MG PO TABS
10.0000 mg | ORAL_TABLET | Freq: Every day | ORAL | Status: DC
  Administered 2019-07-11 (×2): 10 mg via ORAL
  Filled 2019-07-10 (×2): qty 1

## 2019-07-10 MED ORDER — DONEPEZIL HCL 5 MG PO TABS
10.0000 mg | ORAL_TABLET | Freq: Every day | ORAL | Status: DC
  Administered 2019-07-11 (×2): 10 mg via ORAL
  Filled 2019-07-10 (×4): qty 2

## 2019-07-10 MED ORDER — HYDROCODONE-ACETAMINOPHEN 5-325 MG PO TABS: 1.0000 | ORAL_TABLET | Freq: Four times a day (QID) | ORAL | Status: DC | PRN

## 2019-07-10 MED ORDER — LEVOTHYROXINE SODIUM 50 MCG PO TABS
50.0000 ug | ORAL_TABLET | Freq: Every day | ORAL | Status: DC
  Administered 2019-07-11 – 2019-07-12 (×2): 50 ug via ORAL
  Filled 2019-07-10 (×2): qty 1

## 2019-07-10 MED ORDER — SODIUM CHLORIDE 0.9 % IV SOLN
1.0000 g | Freq: Once | INTRAVENOUS | Status: AC
  Administered 2019-07-10: 1 g via INTRAVENOUS
  Filled 2019-07-10: qty 10

## 2019-07-10 MED ORDER — ACETAMINOPHEN 325 MG PO TABS: 650.0000 mg | ORAL_TABLET | Freq: Four times a day (QID) | ORAL | Status: DC | PRN

## 2019-07-10 MED ORDER — BUSPIRONE HCL 15 MG PO TABS
15.0000 mg | ORAL_TABLET | Freq: Two times a day (BID) | ORAL | Status: DC
  Administered 2019-07-11 – 2019-07-12 (×4): 15 mg via ORAL
  Filled 2019-07-10 (×6): qty 1

## 2019-07-10 MED ORDER — SODIUM CHLORIDE 0.9 % IV SOLN
2.0000 g | INTRAVENOUS | Status: DC
  Administered 2019-07-11 – 2019-07-12 (×2): 2 g via INTRAVENOUS
  Filled 2019-07-10: qty 2
  Filled 2019-07-10: qty 20
  Filled 2019-07-10: qty 2
  Filled 2019-07-10: qty 20

## 2019-07-10 MED ORDER — POLYVINYL ALCOHOL 1.4 % OP SOLN
1.0000 [drp] | Freq: Every day | OPHTHALMIC | Status: DC | PRN
  Filled 2019-07-10: qty 15

## 2019-07-10 MED ORDER — TRAMADOL HCL 50 MG PO TABS
50.0000 mg | ORAL_TABLET | Freq: Two times a day (BID) | ORAL | Status: DC
  Administered 2019-07-11 – 2019-07-12 (×4): 50 mg via ORAL
  Filled 2019-07-10 (×4): qty 1

## 2019-07-10 MED ORDER — CYANOCOBALAMIN 500 MCG PO TABS
1000.0000 ug | ORAL_TABLET | Freq: Every day | ORAL | Status: DC
  Administered 2019-07-11 – 2019-07-12 (×2): 1000 ug via ORAL
  Filled 2019-07-10 (×2): qty 2

## 2019-07-10 MED ORDER — BUPROPION HCL ER (XL) 150 MG PO TB24
150.0000 mg | ORAL_TABLET | Freq: Every day | ORAL | Status: DC
  Administered 2019-07-11 – 2019-07-12 (×2): 150 mg via ORAL
  Filled 2019-07-10 (×2): qty 1

## 2019-07-10 MED ORDER — CALCIUM CARBONATE-VITAMIN D 500-200 MG-UNIT PO TABS
1.0000 | ORAL_TABLET | Freq: Every day | ORAL | Status: DC
  Administered 2019-07-11 – 2019-07-12 (×2): 1 via ORAL
  Filled 2019-07-10 (×2): qty 1

## 2019-07-10 MED ORDER — OXYBUTYNIN CHLORIDE 5 MG PO TABS
5.0000 mg | ORAL_TABLET | Freq: Three times a day (TID) | ORAL | Status: DC
  Administered 2019-07-11 – 2019-07-12 (×5): 5 mg via ORAL
  Filled 2019-07-10 (×5): qty 1

## 2019-07-10 MED ORDER — GABAPENTIN 300 MG PO CAPS
300.0000 mg | ORAL_CAPSULE | Freq: Four times a day (QID) | ORAL | Status: DC
  Administered 2019-07-11 – 2019-07-12 (×6): 300 mg via ORAL
  Filled 2019-07-10 (×6): qty 1

## 2019-07-10 NOTE — ED Notes (Signed)
ED TO INPATIENT HANDOFF REPORT  ED Nurse Name and Phone #: [phone]  S Name/Age/Gender Ariana [name] 85 y.o. female Room/Bed: ED09A/ED09A  Code Status   Code Status: Prior  Home/SNF/Other Home Oriented to place, self.  Is this baseline? No   Triage Complete: Triage complete  Chief Complaint AMS   Triage Note PT arrives via ems from home with complaints of altered mental status. Last known well at 2200 last night. EMS report from family hx of dementia but normally pt is able to maintain conversation but today increased confusion was present. Pt alert to person, place (hospital), disoriented to time, and situation.    Allergies Allergies  Allergen Reactions  . Aspirin     Upset stomach  . Diazepam Itching    sneezing  . Other     seasonal allergies  . Tetanus Toxoids     Localized superficial swelling of skin  . Morphine Itching and Rash    Level of Care/Admitting Diagnosis ED Disposition    None      B Medical/Surgery History Past Medical History:  Diagnosis Date  . Alzheimer disease ([place])   . Anemia   . Anxiety   . Asthma   . Chronic back pain   . Depression   . Depression   . GERD (gastroesophageal reflux disease)   . Hiatal hernia   . Hypercholesteremia   . ITP (idiopathic thrombocytopenic purpura)   . Osteoarthritis   . Osteoporosis   . Pulmonary emboli ([place])   . Restless legs    Past Surgical History:  Procedure Laterality Date  . CARPAL TUNNEL RELEASE    . ESOPHAGOGASTRODUODENOSCOPY (EGD) WITH PROPOFOL N/A 09/10/2015   Procedure: ESOPHAGOGASTRODUODENOSCOPY (EGD) WITH PROPOFOL;  Surgeon: Lollie Sails, MD;  Location: [facility] ENDOSCOPY;  Service: Endoscopy;  Laterality: N/A;  . IRRIGATION AND DEBRIDEMENT HEMATOMA Right 07/13/2018   Procedure: IRRIGATION AND DEBRIDEMENT HEMATOMA-RIGHT SHIN;  Surgeon: Herbert Pun, MD;  Location: [facility] ORS;  Service: General;  Laterality: Right;  . KYPHOPLASTY N/A 05/02/2019   Procedure: L1  KYPHOPLASTY;  Surgeon: Hessie Knows, MD;  Location: [facility] ORS;  Service: Orthopedics;  Laterality: N/A;  . LUMBAR [place]    . SPLENECTOMY, PARTIAL    . TOTAL ABDOMINAL HYSTERECTOMY       A IV Location/Drains/Wounds Patient Lines/Drains/Airways Status   Active Line/Drains/Airways    Name:   Placement date:   Placement time:   Site:   Days:   Peripheral IV 07/10/19 Anterior;Left Wrist   07/10/19    1457    Wrist   less than 1   Airway   07/13/18    1152     362   Airway   05/02/19    1140     69   Incision (Closed) 07/13/18 Leg Right   07/13/18    1242     362   Incision (Closed) 05/02/19 Back   05/02/19    1158     69   Wound / Incision (Open or Dehisced) 07/12/18   07/12/18    1830    -   363          Intake/Output Last 24 hours No intake or output data in the 24 hours ending 07/10/19 1816  Labs/Imaging Results for orders placed or performed during the hospital encounter of 07/10/19 (from the past 48 hour(s))  Comprehensive metabolic panel     Status: Abnormal   Collection Time: 07/10/19  2:58 PM  Result Value Ref Range  Sodium 137 135 - 145 mmol/L   Potassium 4.1 3.5 - 5.1 mmol/L    Comment: HEMOLYSIS AT THIS LEVEL MAY AFFECT RESULT   Chloride 102 98 - 111 mmol/L   CO2 27 22 - 32 mmol/L   Glucose, Bld 98 70 - 99 mg/dL   BUN 25 (H) 8 - 23 mg/dL   Creatinine, Ser 0.90 0.44 - 1.00 mg/dL   Calcium 9.4 8.9 - 10.3 mg/dL   Total Protein 6.1 (L) 6.5 - 8.1 g/dL   Albumin 3.5 3.5 - 5.0 g/dL   AST 76 (H) 15 - 41 U/L   ALT 38 0 - 44 U/L   Alkaline Phosphatase 85 38 - 126 U/L   Total Bilirubin 0.6 0.3 - 1.2 mg/dL   GFR calc non Af Amer 59 (L) >60 mL/min   GFR calc Af Amer >60 >60 mL/min   Anion gap 8 5 - 15    Comment: Performed at [facility], [address] [place], [place] [address]  CBC     Status: Abnormal   Collection Time: 07/10/19  2:58 PM  Result Value Ref Range   WBC 23.5 (H) 4.0 - 10.5 K/uL    Comment: WHITE COUNT CONFIRMED ON SMEAR   RBC 3.97  3.87 - 5.11 MIL/uL   Hemoglobin 11.4 (L) 12.0 - 15.0 g/dL   HCT 35.7 (L) 36.0 - 46.0 %   MCV 89.9 80.0 - 100.0 fL   MCH 28.7 26.0 - 34.0 pg   MCHC 31.9 30.0 - 36.0 g/dL   RDW 16.7 (H) 11.5 - 15.5 %   Platelets 282 150 - 400 K/uL   nRBC 0.0 0.0 - 0.2 %    Comment: Performed at [facility], [place], [place] [address]  Troponin I (High Sensitivity)     Status: None   Collection Time: 07/10/19  2:58 PM  Result Value Ref Range   Troponin I (High Sensitivity) 13 <18 ng/L    Comment: (NOTE) Elevated high sensitivity troponin I (hsTnI) values and significant  changes across serial measurements may suggest ACS but many other  chronic and acute conditions are known to elevate hsTnI results.  Refer to the "Links" section for chest pain algorithms and additional  guidance. Performed at [facility], [address] [place], [place] [address]    No results found.  Pending Labs [employer] (From admission, onward)    Start     Ordered   07/10/19 1736  SARS Coronavirus 2 [facility] order, Performed in [facility] hospital lab) Nasopharyngeal Nasopharyngeal Swab  (Symptomatic/High Risk of Exposure/Tier 1 Patients Labs with Precautions)  Once,   STAT    Question Answer Comment  Is this test for diagnosis or screening Diagnosis of ill patient   Symptomatic for COVID-19 as defined by CDC Unknown   Hospitalized for COVID-19 Unknown   Admitted to ICU for COVID-19 Unknown   Previously tested for COVID-19 Unknown   Resident in a congregate (group) care setting Unknown   Employed in healthcare setting Unknown   Pregnant Unknown      07/10/19 1735   07/10/19 1642  Lactic acid, plasma  Now then every 2 hours,   STAT     07/10/19 1641   07/10/19 1642  Blood culture (routine x 2)  BLOOD CULTURE X 2,   STAT     07/10/19 1641          Vitals/Pain Today's Vitals   07/10/19 1630 07/10/19 1700 07/10/19 1730  07/10/19 1800  BP: 119/86 136/66 114/86 106/88   Pulse: 77 78 66 82  Resp: 17 14 15    Temp:      TempSrc:      SpO2: 97% 99% 99% 99%  Weight:      Height:        Isolation Precautions Airborne and Contact precautions  Medications Medications - No data to display  Mobility walks High fall risk   Focused Assessments    R Recommendations: See Admitting Provider Note  Report given to:   Additional Notes:

## 2019-07-10 NOTE — ED Provider Notes (Addendum)
[facility] Emergency Department Provider Note  ____________________________________________   I have reviewed the triage vital signs and the nursing notes.   HISTORY  Chief Complaint Altered Mental Status   History limited by and level 5 caveat due to: AMS  HPI Ariana [name] is a 85 y.o. female who presents to the emergency department today because of concerns for altered mental status.  Patient herself cannot give any meaningful history.  She is not oriented.  Patient does have a history of dementia but apparently was noticed to be increasingly altered.  Was last talked to and seemed to be at her normal last night.  Records reviewed. Per medical record review patient has a history of alzheimer's disease  Past Medical History:  Diagnosis Date  . Alzheimer disease ([place])   . Anemia   . Anxiety   . Asthma   . Chronic back pain   . Depression   . Depression   . GERD (gastroesophageal reflux disease)   . Hiatal hernia   . Hypercholesteremia   . ITP (idiopathic thrombocytopenic purpura)   . Osteoarthritis   . Osteoporosis   . Pulmonary emboli ([place])   . Restless legs     Patient Active Problem List   Diagnosis Date Noted  . Laceration of left lower leg 04/10/2019  . Chronic pain syndrome 01/11/2019  . OSA (obstructive sleep apnea) 09/28/2018  . Ulcer of lower limb, right, limited to breakdown of skin ([place]) 07/22/2018  . Traumatic hematoma of right lower leg 07/12/2018  . Asthma without status asthmaticus 11/25/2016  . Late onset Alzheimer's disease without behavioral disturbance ([place]) 12/30/2015  . Back muscle spasm 09/09/2015  . Clinical depression 04/12/2015  . Herpes zona 04/12/2015  . Chronic ITP (idiopathic thrombocytopenic purpura) ([facility]) 03/15/2015  . Chest pain 10/22/2014  . Breathlessness on exertion 10/22/2014  . Osteopenia 10/18/2014  . Benign essential HTN 06/28/2014  . HLD (hyperlipidemia) 06/28/2014  . Idiopathic  thrombocythemia ([place]) 06/28/2014  . Acquired hypothyroidism 06/04/2014  . Anxiety and depression 06/04/2014  . Bursitis, trochanteric 03/27/2014  . DDD (degenerative disc disease), lumbar 02/27/2014  . Neuritis or radiculitis due to rupture of lumbar intervertebral disc 02/27/2014  . Lumbar radiculitis 02/27/2014    Past Surgical History:  Procedure Laterality Date  . CARPAL TUNNEL RELEASE    . ESOPHAGOGASTRODUODENOSCOPY (EGD) WITH PROPOFOL N/A 09/10/2015   Procedure: ESOPHAGOGASTRODUODENOSCOPY (EGD) WITH PROPOFOL;  Surgeon: Lollie Sails, MD;  Location[facility] ENDOSCOPY;  Service: Endoscopy;  Laterality: N/A;  . IRRIGATION AND DEBRIDEMENT HEMATOMA Right 07/13/2018   Procedure: IRRIGATION AND DEBRIDEMENT HEMATOMA-RIGHT SHIN;  Surgeon: Herbert Pun, MD;  Location: [facility] ORS;  Service: General;  Laterality: Right;  . KYPHOPLASTY N/A 05/02/2019   Procedure: L1 KYPHOPLASTY;  Surgeon: Hessie Knows, MD;  Location: [facility] ORS;  Service: Orthopedics;  Laterality: N/A;  . LUMBAR [place]    . SPLENECTOMY, PARTIAL    . TOTAL ABDOMINAL HYSTERECTOMY      Prior to Admission medications   Medication Sig Start Date End Date Taking? Authorizing Provider  acetaminophen (TYLENOL) 325 MG tablet Take 2 tablets (650 mg total) by mouth every 6 (six) hours as needed for mild pain (or Fever >/= 101). 07/02/15   Idelle Crouch, MD  albuterol (PROVENTIL HFA;VENTOLIN HFA) 108 (90 BASE) MCG/ACT inhaler Inhale 2 puffs into the lungs every 6 (six) hours as needed for wheezing or shortness of breath.    [provider]  ALPRAZolam Duanne Moron) 0.5 MG tablet Take 1 tablet (0.5  mg total) by mouth 3 (three) times daily as needed for anxiety. 07/15/18   Gladstone Lighter, MD  buPROPion (WELLBUTRIN XL) 150 MG 24 hr tablet Take 150 mg by mouth daily.    [provider]  busPIRone (BUSPAR) 15 MG tablet Take 15 mg by mouth daily.  08/15/18   [provider]  Calcium Carbonate-Vit D-Min  (CALCIUM 600+D PLUS MINERALS) 600-400 MG-UNIT TABS Take 1 tablet by mouth daily.     [provider]  Carboxymethylcellul-Glycerin (LUBRICATING EYE DROPS OP) Place 1 drop into both eyes daily as needed (dry eyes).    [provider]  cholecalciferol (VITAMIN D3) 25 MCG (1000 UT) tablet Take 1,000 Units by mouth 2 (two) times a day.    [provider]  citalopram (CELEXA) 40 MG tablet Take 40 mg by mouth daily.    [provider]  Cyanocobalamin (RA VITAMIN B-12 TR) 1000 MCG TBCR Take 1,000 mcg by mouth daily.     [provider]  cyclobenzaprine (FLEXERIL) 5 MG tablet Take 5 mg by mouth 3 (three) times daily as needed for muscle spasms. 04/26/19   [provider]  donepezil (ARICEPT) 10 MG tablet Take 10 mg by mouth at bedtime.  04/03/19 09/30/19  [provider]  fluticasone (FLONASE) 50 MCG/ACT nasal spray Place 2 sprays into both nostrils daily as needed for allergies.  03/03/17   [provider]  Fluticasone-Salmeterol (ADVAIR) 250-50 MCG/DOSE AEPB Inhale 1 puff into the lungs daily as needed (shortness of breath).     [provider]  gabapentin (NEURONTIN) 300 MG capsule Take 300 mg by mouth 4 (four) times daily.    [provider]  HYDROcodone-acetaminophen (NORCO) 5-325 MG tablet Take 1 tablet by mouth every 6 (six) hours as needed for moderate pain. 05/02/19   Hessie Knows, MD  levothyroxine (SYNTHROID, LEVOTHROID) 50 MCG tablet Take 50 mcg by mouth daily before breakfast. Take 30 to 60 minutes before breakfast.    [provider]  memantine (NAMENDA) 10 MG tablet Take 10 mg by mouth daily.  04/03/19 07/02/19  [provider]  montelukast (SINGULAIR) 10 MG tablet Take 10 mg by mouth at bedtime.     [provider]  Multiple Vitamin (MULTI-VITAMIN) tablet Take 1 tablet by mouth daily.     [provider]  Multiple Vitamins-Minerals (PRESERVISION AREDS 2) CAPS Take 1 capsule  by mouth 2 (two) times a day.    [provider]  NARCAN 4 MG/0.1ML LIQD nasal spray kit Place 1 spray into the nose once.  10/24/18   [provider]  oxybutynin (DITROPAN) 5 MG tablet Take 5 mg by mouth 3 (three) times daily.    [provider]  pantoprazole (PROTONIX) 20 MG tablet Take 20 mg by mouth 2 (two) times daily.  07/24/15   [provider]  pravastatin (PRAVACHOL) 20 MG tablet Take 20 mg by mouth daily.    [provider]  ranitidine (ZANTAC) 300 MG tablet Take 300 mg by mouth at bedtime.  11/18/18 11/18/19  [provider]  traMADol (ULTRAM) 50 MG tablet Take 50 mg by mouth 2 (two) times daily.  07/23/18   [provider]  vitamin C (ASCORBIC ACID) 500 MG tablet Take 500 mg by mouth daily.    [provider]  vitamin E (E-400) 400 UNIT capsule Take 400 Units by mouth daily.    [provider]    Allergies Aspirin, Diazepam, Other, Tetanus toxoids, and Morphine  Family  History  Problem Relation Age of Onset  . Stroke Mother     Social History Social History   Tobacco Use  . Smoking status: Never Smoker  . Smokeless tobacco: Never Used  Substance Use Topics  . Alcohol use: No    Alcohol/week: 0.0 standard drinks  . Drug use: No    Review of Systems Unable to obtain secondary to AMS, dementia.  ____________________________________________   PHYSICAL EXAM:  VITAL SIGNS: ED Triage Vitals  Enc Vitals Group     BP 07/10/19 1500 129/64     Pulse Rate 07/10/19 1500 80     Resp 07/10/19 1500 19     Temp 07/10/19 1500 99.5 F (37.5 C)     Temp Source 07/10/19 1500 Oral     SpO2 07/10/19 1500 94 %     Weight 07/10/19 1457 136 lb 11 oz (62 kg)     Height 07/10/19 1457 5' 8" (1.727 m)   Constitutional: Awake, alert. Not oriented.  Eyes: Conjunctivae are normal.  ENT      Head: Normocephalic. Bruise noted around right temple.       Nose: No congestion/rhinnorhea.      Mouth/Throat: Mucous  membranes are moist.      Neck: No stridor. Hematological/Lymphatic/Immunilogical: No cervical lymphadenopathy. Cardiovascular: Normal rate, regular rhythm.  No murmurs, rubs, or gallops. Respiratory: Normal respiratory effort without tachypnea nor retractions. Breath sounds are clear and equal bilaterally. No wheezes/rales/rhonchi. Gastrointestinal: Soft and non tender. No rebound. No guarding.  Genitourinary: Deferred Musculoskeletal: Normal range of motion in all extremities. No lower extremity edema. Neurologic: No oriented. Moving all extremities. Not following all commands.  Skin:  Skin is warm, dry and intact. No rash noted. ____________________________________________    LABS (pertinent positives/negatives)  CMP wnl except bun 25, t pro 6.1, ast 76 Trop hs 13 Lactic 1.0 UA clear, 0-5 wbc, neg nitrite, leukocytes, bacteria none seen CBC wbc 23.5, hgb 11.4, plt 282 COVID neg ____________________________________________   EKG  I, Nance Pear, attending physician, personally viewed and interpreted this EKG  EKG Time: 1505 Rate: 80 Rhythm: sinus rhythm Axis: left axis deviation Intervals: qtc 493 QRS: LBBB ST changes: no st elevation Impression: abnormal ekg   ____________________________________________    RADIOLOGY  CXR New multifocal opacities  CT head/cervical spine No acute abnormality ____________________________________________   PROCEDURES  Procedures  ____________________________________________   INITIAL IMPRESSION / ASSESSMENT AND PLAN / ED COURSE  Pertinent labs & imaging results that were available during my care of the patient were reviewed by me and considered in my medical decision making (see chart for details).   Patient presented to the emergency department today because of concerns for altered mental status.  Differential would be broad.  Patient's work-up was concerning for pneumonia.  Cover test was negative.  I discussed this  finding with the husband.  CT head and cervical spine was obtained given husband's history of some falls and bruising noted to right temple.  This was negative for acute traumatic injury.  Will plan on admission. IVFs started.   ____________________________________________   FINAL CLINICAL IMPRESSION(S) / ED DIAGNOSES  Final diagnoses:  Altered mental status, unspecified altered mental status type  Pneumonia due to infectious organism, unspecified laterality, unspecified part of lung     Note: This dictation was prepared with Dragon dictation. Any transcriptional errors that result from this process are unintentional     Nance Pear, MD 07/10/19 2113    Nance Pear, MD 07/28/19 2035

## 2019-07-10 NOTE — ED Notes (Signed)
Patient transported to CT 

## 2019-07-10 NOTE — ED Notes (Signed)
MD Archie Balboa at bedside to provide update to pt and spouse.

## 2019-07-10 NOTE — ED Notes (Signed)
Ariana [name] (spouse at bedside). Per spouse st neighbor called EMS due to pt being AMS, "falling sleep all the time, we thought it would be best to bring her here". For the "last couple of say "she was not answering questions very good" but this morning was worse. LKW 2200 per spouse.

## 2019-07-10 NOTE — ED Notes (Signed)
ED Provider, Goodman at bedside. 

## 2019-07-10 NOTE — ED Triage Notes (Signed)
PT arrives via ems from home with complaints of altered mental status. Last known well at 2200 last night. EMS report from family hx of dementia but normally pt is able to maintain conversation but today increased confusion was present. Pt alert to person, place (hospital), disoriented to time, and situation.

## 2019-07-10 NOTE — ED Notes (Signed)
Pt st "pain on my head". MD goodman at bedside.

## 2019-07-11 ENCOUNTER — Inpatient Hospital Stay (HOSPITAL_COMMUNITY)
Admit: 2019-07-11 | Discharge: 2019-07-11 | Disposition: A | Discharge status: Home / Self Care | Payer: Medicare HMO | Attending: Internal Medicine | Admitting: Internal Medicine

## 2019-07-11 DIAGNOSIS — I340 Nonrheumatic mitral (valve) insufficiency: Secondary | ICD-10-CM

## 2019-07-11 DIAGNOSIS — I361 Nonrheumatic tricuspid (valve) insufficiency: Secondary | ICD-10-CM

## 2019-07-11 LAB — CBC
HCT: 34 % — ABNORMAL LOW (ref 36.0–46.0)
Hemoglobin: 10.6 g/dL — ABNORMAL LOW (ref 12.0–15.0)
MCH: 28.2 pg (ref 26.0–34.0)
MCHC: 31.2 g/dL (ref 30.0–36.0)
MCV: 90.4 fL (ref 80.0–100.0)
Platelets: 270 10*3/uL (ref 150–400)
RBC: 3.76 MIL/uL — ABNORMAL LOW (ref 3.87–5.11)
RDW: 16.8 % — ABNORMAL HIGH (ref 11.5–15.5)
WBC: 21 10*3/uL — ABNORMAL HIGH (ref 4.0–10.5)
nRBC: 0 % (ref 0.0–0.2)

## 2019-07-11 LAB — RESPIRATORY PANEL BY PCR

## 2019-07-11 LAB — COMPREHENSIVE METABOLIC PANEL
ALT: 30 U/L (ref 0–44)
AST: 48 U/L — ABNORMAL HIGH (ref 15–41)
Albumin: 3.2 g/dL — ABNORMAL LOW (ref 3.5–5.0)
Alkaline Phosphatase: 77 U/L (ref 38–126)
Anion gap: 8 (ref 5–15)
BUN: 14 mg/dL (ref 8–23)
CO2: 25 mmol/L (ref 22–32)
Calcium: 8.4 mg/dL — ABNORMAL LOW (ref 8.9–10.3)
Chloride: 108 mmol/L (ref 98–111)
Creatinine, Ser: 0.67 mg/dL (ref 0.44–1.00)
GFR calc Af Amer: >60 mL/min (ref >60)
GFR calc non Af Amer: >60 mL/min (ref >60)
Glucose, Bld: 77 mg/dL (ref 70–99)
Potassium: 3.7 mmol/L (ref 3.5–5.1)
Sodium: 141 mmol/L (ref 135–145)
Total Bilirubin: 0.7 mg/dL (ref 0.3–1.2)
Total Protein: 5.7 g/dL — ABNORMAL LOW (ref 6.5–8.1)

## 2019-07-11 LAB — BRAIN NATRIURETIC PEPTIDE: B Natriuretic Peptide: 281 pg/mL — ABNORMAL HIGH (ref 0.0–100.0)

## 2019-07-11 LAB — PROCALCITONIN: Procalcitonin: 0.12 ng/mL

## 2019-07-11 MED ORDER — SODIUM CHLORIDE 0.9 % IV SOLN
INTRAVENOUS | Status: DC | PRN
  Administered 2019-07-11: 250 mL via INTRAVENOUS
  Administered 2019-07-12: 500 mL via INTRAVENOUS

## 2019-07-11 MED ORDER — CITALOPRAM HYDROBROMIDE 20 MG PO TABS
20.0000 mg | ORAL_TABLET | Freq: Every day | ORAL | Status: DC
  Administered 2019-07-12: 10:00:00 20 mg via ORAL
  Filled 2019-07-11: qty 1

## 2019-07-11 NOTE — Evaluation (Signed)
Physical Therapy Evaluation Patient Details Name: Ariana [name] MRN: [id number] DOB: [birth year] Today's Date: 07/11/2019   History of Present Illness  presented to ER secondary to worsening AMS, progressive weakness, multiple falls; admitted for management of PNA.  Clinical Impression  Upon evaluation, patient alert and oriented to self, general location; follows simple commands, pleasant and cooperative.  Does require frequent reorientation to task, frequent cuing for safety.  Bilat UE/LE strength generally deconditioned due to acute illness, but functional for basic transfers and gait efforts. No focal weakness appreciated.  Currently requiring min assist for bed mobility; min/mod assist for sit/stand, basic transfers and gait (30') with RW.  Demonstrates choppy gait performance, generally unsteady; veers R, often bumping/stopping walker into obstacles, requiring assist from therapist for correction (and constant min/mod assist to maintain pathway once corrected). Unsafe to attempt without RW and +1 assist at all times. Would benefit from skilled PT to address above deficits and promote optimal return to PLOF; recommend transition to STR upon discharge from acute hospitalization.  Will continue to monitor and update discharge recs as appropriate.      Follow Up Recommendations SNF    Equipment Recommendations       Recommendations for Other Services       Precautions / Restrictions Precautions Precautions: Fall Restrictions Weight Bearing Restrictions: No      Mobility  Bed Mobility Overal bed mobility: Needs Assistance Bed Mobility: Supine to Sit     Supine to sit: Min assist     General bed mobility comments: requires use of bedrails to complete supine to sit  Transfers Overall transfer level: Needs assistance Equipment used: Rolling walker (2 wheeled) Transfers: Sit to/from Stand Sit to Stand: Min assist;Mod assist         General transfer comment:  assist for lift off, steadying  Ambulation/Gait Ambulation/Gait assistance: Min assist Gait Distance (Feet): 30 Feet Assistive device: Rolling walker (2 wheeled)       General Gait Details: choppy gait performance, generally unsteady; veers R, often bumping/stopping walker into obstacles, requiring assist from therapist for correction (and constant min/mod assist to maintain pathway once corrected)  Stairs            Wheelchair Mobility    Modified Rankin (Stroke Patients Only)       Balance Overall balance assessment: Needs assistance Sitting-balance support: No upper extremity supported;Feet supported Sitting balance-Leahy Scale: Good     Standing balance support: No upper extremity supported Standing balance-Leahy Scale: Poor                               Pertinent Vitals/Pain Pain Assessment: No/denies pain    Home Living Family/patient expects to be discharged to:: Private residence Living Arrangements: Spouse/significant other Available Help at Discharge: Family;Available 24 hours/day Type of Home: Mobile home Home Access: Stairs to enter Entrance Stairs-Rails: Left Entrance Stairs-Number of Steps: 3 Home Layout: One level Home Equipment: Walker - 2 wheels;Walker - 4 wheels      Prior Function           Comments: Ambulatory for household distances, prefers 'furniture walking' versus use of assist device; husband endorses multiple fall history (most recent, 4 in two days prior to admission)     Hand Dominance        Extremity/Trunk Assessment   Upper Extremity Assessment Upper Extremity Assessment: Overall WFL for tasks assessed    Lower Extremity Assessment Lower Extremity Assessment: Generalized weakness(grossly  4-/5 throughotu)       Communication   Communication: No difficulties  Cognition Arousal/Alertness: Awake/alert Behavior During Therapy: WFL for tasks assessed/performed Overall Cognitive Status: History of  cognitive impairments - at baseline                                 General Comments: oriented to self, general location; unaware of date.  Follows simple commands, pleasant and cooperative.  Limited insight into deficits, limited integration of new information.      General Comments      Exercises     Assessment/Plan    PT Assessment Patient needs continued PT services  PT Problem List Decreased strength;Decreased range of motion;Decreased activity tolerance;Decreased balance;Decreased mobility;Decreased knowledge of use of DME;Decreased safety awareness;Decreased coordination;Decreased cognition;Decreased knowledge of precautions       PT Treatment Interventions DME instruction;Gait training;Stair training;Therapeutic activities;Cognitive remediation;Functional mobility training;Therapeutic exercise;Balance training;Patient/family education    PT Goals (Current goals can be found in the Care Plan section)  Acute Rehab PT Goals Patient Stated Goal: to get her ready to come back home PT Goal Formulation: With patient/family Time For Goal Achievement: 07/25/19 Potential to Achieve Goals: Fair    Frequency Min 2X/week   Barriers to discharge Decreased caregiver support(limited ability to provide physical assist (history of R hand/wrist injury and repair))      Co-evaluation               AM-PAC PT "6 Clicks" Mobility  Outcome Measure Help needed turning from your back to your side while in a flat bed without using bedrails?: A Little Help needed moving from lying on your back to sitting on the side of a flat bed without using bedrails?: A Little Help needed moving to and from a bed to a chair (including a wheelchair)?: A Little Help needed standing up from a chair using your arms (e.g., wheelchair or bedside chair)?: A Lot Help needed to walk in hospital room?: A Lot Help needed climbing 3-5 steps with a railing? : A Lot 6 Click Score: 15    End of  Session Equipment Utilized During Treatment: Gait belt Activity Tolerance: Patient tolerated treatment well Patient left: in chair;with call bell/phone within reach(alarm pad in place, box not available; CNA/RN informed/awrae and to locate/place. Husband in room, aware of need for staff assist with any mobility attempts.) Nurse Communication: Mobility status PT Visit Diagnosis: Muscle weakness (generalized) (M62.81);Difficulty in walking, not elsewhere classified (R26.2)    Time: [id number] PT Time Calculation (min) (ACUTE ONLY): 28 min   Charges:   PT Evaluation $PT Eval Moderate Complexity: 1 Mod PT Treatments $Gait Training: 8-22 mins        Minh Roanhorse H. Owens Shark, PT, DPT, NCS 07/11/19, 3:18 PM [phone]

## 2019-07-11 NOTE — H&P (Addendum)
[facility] [place] NAME: Ariana [name]    MR#:  [id number]  DATE OF BIRTH:  27-Feb-1933  DATE OF ADMISSION:  07/10/2019  PRIMARY CARE PHYSICIAN: Idelle Crouch, MD   REQUESTING/REFERRING PHYSICIAN: Nance Pear, MD  CHIEF COMPLAINT:   Chief Complaint  Patient presents with  . Altered Mental Status    HISTORY OF PRESENT ILLNESS:  85 y.o. female with pertinent past medical history of OSA, chronic pain syndrome, cellulitis of RLL, dementia, hyperlipidemia, idiopathic thrombocytopenia, hypertension, anxiety and depression, hypothyroidism, and asthma presenting to the ED with altered mental status.  Patient is currently altered therefore history mostly obtained from patient's chart. Per ED reports, patient was brought in by EMS from home following reports of worsening confusion.  Patient was last known well at 2200 last night.  Family states patient has history of dementia but normally she is able to maintain full conversation without any difficulty.  There were no reports of any other associated symptoms of fevers or chills, nausea vomiting, dizziness, chest pain, abdominal pain, diarrhea, shortness of breath, or recent traumatic fall.  On arrival to the ED, she was afebrile with blood pressure 114/57 mm Hg and pulse rate 81 beats/min. There were no focal neurological deficits; she was alert to person, place but disoriented to time and situation.  Initial labs revealed elevated white count of 23.5, slightly elevated BUN 25, AST 76, COVID-19 negative.  Urinalysis negative for UTI.  Noncontrast CT head and CT cervical spine was obtained and did not show any abnormality.  Chest x-ray showed questionable atypical/viral infection.  Hospitalist asked to admit for further management.  PAST MEDICAL HISTORY:   Past Medical History:  Diagnosis Date  . Alzheimer disease ([place])   . Anemia   . Anxiety   . Asthma   . Chronic back pain   .  Depression   . Depression   . GERD (gastroesophageal reflux disease)   . Hiatal hernia   . Hypercholesteremia   . ITP (idiopathic thrombocytopenic purpura)   . Osteoarthritis   . Osteoporosis   . Pulmonary emboli ([place])   . Restless legs     PAST SURGICAL HISTORY:   Past Surgical History:  Procedure Laterality Date  . CARPAL TUNNEL RELEASE    . ESOPHAGOGASTRODUODENOSCOPY (EGD) WITH PROPOFOL N/A 09/10/2015   Procedure: ESOPHAGOGASTRODUODENOSCOPY (EGD) WITH PROPOFOL;  Surgeon: Lollie Sails, MD;  Location: [facility] ENDOSCOPY;  Service: Endoscopy;  Laterality: N/A;  . IRRIGATION AND DEBRIDEMENT HEMATOMA Right 07/13/2018   Procedure: IRRIGATION AND DEBRIDEMENT HEMATOMA-RIGHT SHIN;  Surgeon: Herbert Pun, MD;  Location: [facility] ORS;  Service: General;  Laterality: Right;  . KYPHOPLASTY N/A 05/02/2019   Procedure: L1 KYPHOPLASTY;  Surgeon: Hessie Knows, MD;  Location: [facility] ORS;  Service: Orthopedics;  Laterality: N/A;  . LUMBAR [place]    . SPLENECTOMY, PARTIAL    . TOTAL ABDOMINAL HYSTERECTOMY      SOCIAL HISTORY:   Social History   Tobacco Use  . Smoking status: Never Smoker  . Smokeless tobacco: Never Used  Substance Use Topics  . Alcohol use: No    Alcohol/week: 0.0 standard drinks    FAMILY HISTORY:   Family History  Problem Relation Age of Onset  . Stroke Mother     DRUG ALLERGIES:   Allergies  Allergen Reactions  . Aspirin     Upset stomach  . Diazepam Itching    sneezing  . Other     seasonal allergies  .  Tetanus Toxoids     Localized superficial swelling of skin  . Morphine Itching and Rash    REVIEW OF SYSTEMS:   Review of Systems  Unable to perform ROS: Mental status change   MEDICATIONS AT HOME:   Prior to Admission medications   Medication Sig Start Date End Date Taking? Authorizing Provider  ALPRAZolam Duanne Moron) 0.5 MG tablet Take 1 tablet (0.5 mg total) by mouth 3 (three) times daily as needed for anxiety. 07/15/18  Yes Gladstone Lighter, MD  buPROPion (WELLBUTRIN XL) 150 MG 24 hr tablet Take 150 mg by mouth daily.   Yes [provider]  busPIRone (BUSPAR) 15 MG tablet Take 15 mg by mouth 2 (two) times daily.  08/15/18  Yes [provider]  Calcium Carbonate-Vit D-Min (CALCIUM 600+D PLUS MINERALS) 600-400 MG-UNIT TABS Take 1 tablet by mouth daily.    Yes [provider]  cholecalciferol (VITAMIN D3) 25 MCG (1000 UT) tablet Take 1,000 Units by mouth 2 (two) times a day.   Yes [provider]  citalopram (CELEXA) 40 MG tablet Take 40 mg by mouth daily.   Yes [provider]  Cyanocobalamin (RA VITAMIN B-12 TR) 1000 MCG TBCR Take 1,000 mcg by mouth daily.    Yes [provider]  donepezil (ARICEPT) 10 MG tablet Take 10 mg by mouth at bedtime.  04/03/19 09/30/19 Yes [provider]  fluticasone (FLONASE) 50 MCG/ACT nasal spray Place 2 sprays into both nostrils daily as needed for allergies.  03/03/17  Yes [provider]  Fluticasone-Salmeterol (ADVAIR) 250-50 MCG/DOSE AEPB Inhale 1 puff into the lungs daily as needed (shortness of breath).    Yes [provider]  gabapentin (NEURONTIN) 300 MG capsule Take 300 mg by mouth 4 (four) times daily.   Yes [provider]  levothyroxine (SYNTHROID, LEVOTHROID) 50 MCG tablet Take 50 mcg by mouth daily before breakfast. Take 30 to 60 minutes before breakfast.   Yes [provider]  memantine (NAMENDA) 10 MG tablet Take 10 mg by mouth daily.  04/03/19 07/10/19 Yes [provider]  montelukast (SINGULAIR) 10 MG tablet Take 10 mg by mouth at bedtime.    Yes [provider]  Multiple Vitamin (MULTI-VITAMIN) tablet Take 1 tablet by mouth daily.    Yes [provider]  Multiple Vitamins-Minerals (PRESERVISION AREDS 2) CAPS Take 1 capsule by mouth 2 (two) times a day.   Yes [provider]  oxybutynin (DITROPAN) 5 MG tablet Take 5 mg by mouth 3 (three) times daily.    Yes [provider]  pantoprazole (PROTONIX) 20 MG tablet Take 20 mg by mouth 2 (two) times daily.  07/24/15  Yes [provider]  pravastatin (PRAVACHOL) 20 MG tablet Take 20 mg by mouth daily.   Yes [provider]  vitamin C (ASCORBIC ACID) 500 MG tablet Take 500 mg by mouth daily.   Yes [provider]  vitamin E (E-400) 400 UNIT capsule Take 400 Units by mouth daily.   Yes [provider]  acetaminophen (TYLENOL) 325 MG tablet Take 2 tablets (650 mg total) by mouth every 6 (six) hours as needed for mild pain (or Fever >/= 101). 07/02/15   Idelle Crouch, MD  albuterol (PROVENTIL HFA;VENTOLIN HFA) 108 (90 BASE) MCG/ACT inhaler Inhale 2 puffs into the lungs every 6 (six) hours as needed for wheezing or shortness of breath.    [provider]  Carboxymethylcellul-Glycerin (LUBRICATING EYE DROPS OP) Place 1 drop into both eyes daily  as needed (dry eyes).    [provider]  cyclobenzaprine (FLEXERIL) 5 MG tablet Take 5 mg by mouth 3 (three) times daily as needed for muscle spasms. 04/26/19   [provider]  HYDROcodone-acetaminophen (NORCO) 5-325 MG tablet Take 1 tablet by mouth every 6 (six) hours as needed for moderate pain. 05/02/19   Hessie Knows, MD  [facility] 4 MG/0.1ML LIQD nasal spray kit Place 1 spray into the nose once.  10/24/18   [provider]  ranitidine (ZANTAC) 300 MG tablet Take 300 mg by mouth at bedtime.  11/18/18 11/18/19  [provider]  traMADol (ULTRAM) 50 MG tablet Take 50 mg by mouth 2 (two) times daily.  07/23/18   [provider]      VITAL SIGNS:  Blood pressure (!) 133/54, pulse 92, temperature 98.4 F (36.9 C), temperature source Oral, resp. rate 16, height '5\' 8"'$  (1.727 m), weight 73.8 kg, SpO2 97 %.  PHYSICAL EXAMINATION:   Physical Exam  GENERAL:  85 y.o.-year-old patient lying in the bed with no acute distress.  EYES: Pupils equal, round, reactive to light and  accommodation. No scleral icterus. Extraocular muscles intact.  HEENT: Right temporal bruising, normocephalic. Oropharynx and nasopharynx clear.  NECK:  Supple, no jugular venous distention. No thyroid enlargement, no tenderness.  LUNGS: Decreased breath sounds bilaterally, mild wheezing bilaterally. No rales,rhonchi or crepitation. No use of accessory muscles of respiration.  CARDIOVASCULAR: S1, S2 normal. Murmur present, rubs, or gallops.  ABDOMEN: Soft, nontender, nondistended. Bowel sounds present. No organomegaly or mass.  EXTREMITIES: No pedal edema, cyanosis, or clubbing.  NEUROLOGIC: Cranial nerves II through XII are intact. Muscle strength 5/5 in all extremities. Sensation intact. Gait not checked.  PSYCHIATRIC: The patient is alert and oriented x 3.  SKIN: Bilateral leg bruising and RLE anterior skin lesion/mold  DATA REVIEWED:  LABORATORY PANEL:   CBC Recent Labs  Lab 07/10/19 1458  WBC 23.5*  HGB 11.4*  HCT 35.7*  PLT 282   ------------------------------------------------------------------------------------------------------------------  Chemistries  Recent Labs  Lab 07/10/19 1458  NA 137  K 4.1  CL 102  CO2 27  GLUCOSE 98  BUN 25*  CREATININE 0.90  CALCIUM 9.4  AST 76*  ALT 38  ALKPHOS 85  BILITOT 0.6   ------------------------------------------------------------------------------------------------------------------  Cardiac Enzymes No results for input(s): TROPONINI in the last 168 hours. ------------------------------------------------------------------------------------------------------------------  RADIOLOGY:  Ct Head Wo Contrast  Result Date: 07/10/2019 CLINICAL DATA:  85 year old female with altered mental status. Last known well 2200 hours yesterday. EXAM: CT HEAD WITHOUT CONTRAST TECHNIQUE: Contiguous axial images were obtained from the base of the skull through the vertex without intravenous contrast. COMPARISON:  Brain MRI 05/16/2014. Head  CT 07/11/2018. FINDINGS: Brain: Patchy and confluent cerebral white matter hypodensity appears stable since 2019. Chronic involvement of the deep white matter capsules. Mild heterogeneity in the deep gray nuclei. No midline shift, ventriculomegaly, mass effect, evidence of mass lesion, intracranial hemorrhage or evidence of cortically based acute infarction. No cortical encephalomalacia. Vascular: Calcified atherosclerosis at the skull base. No suspicious intracranial vascular hyperdensity. Dominant right vertebral artery. Skull: No acute osseous abnormality identified. Sinuses/Orbits: Visualized paranasal sinuses and mastoids are stable and well pneumatized. Other: No acute orbit or scalp soft tissue finding. IMPRESSION: No acute intracranial abnormality. Stable non contrast CT appearance of the brain since 2019 with evidence of chronic small vessel disease. Electronically Signed   By: Genevie Ann M.D.   On: 07/10/2019 20:16   Ct Cervical Spine Wo Contrast  Result  Date: 07/10/2019 CLINICAL DATA:  85 year old female with altered mental status. Last known well 2200 hours yesterday. EXAM: CT CERVICAL SPINE WITHOUT CONTRAST TECHNIQUE: Multidetector CT imaging of the cervical spine was performed without intravenous contrast. Multiplanar CT image reconstructions were also generated. COMPARISON:  Head CT today reported separately. MRI cervical spine 09/03/2007. FINDINGS: Alignment: Mild reversal of cervical lordosis since the 2008 MRI which demonstrated straightening. Mild anterolisthesis of C2 on C3 appears to be degenerative in nature, associated with left greater than right facet arthropathy. Similar mild anterolisthesis at C7-T1 with facet degeneration greater on the right. Bilateral posterior element alignment is within normal limits. Skull base and vertebrae: Visualized skull base is intact. No atlanto-occipital dissociation. No acute osseous abnormality identified. Soft tissues and spinal canal: No prevertebral  fluid or swelling. No visible canal hematoma. Calcified carotid atherosclerosis in the neck. Otherwise negative visible noncontrast neck soft tissues. Disc levels: Chronic degenerative changes at the anterior C1-C2 articulation and widespread advanced disc, endplate, and facet degeneration. Vacuum disc and vacuum containing subchondral cysts. However, fairly capacious cervical spinal canal, no significant spinal stenosis suspected. Upper chest: Visible upper thoracic levels appear grossly intact with degenerative changes. Negative visible lung apices. Calcified aortic atherosclerosis. IMPRESSION: 1.  No acute osseous abnormality identified in the cervical spine. 2. Widespread advanced cervical spine degeneration including mild spondylolisthesis, but no significant cervical spinal stenosis suspected. Electronically Signed   By: Genevie Ann M.D.   On: 07/10/2019 20:20   Dg Chest Portable 1 View  Result Date: 07/10/2019 CLINICAL DATA:  Altered mental status. EXAM: PORTABLE CHEST 1 VIEW COMPARISON:  Chest radiographs and CTA 11/22/2016 FINDINGS: The cardiac silhouette is upper limits of normal in size. The lungs are hypoinflated with peribronchial thickening and mild interstitial accentuation bilaterally, left slightly greater than right. No large pleural effusion or pneumothorax is identified. No acute osseous abnormality is identified. IMPRESSION: Mild hypoinflation with new interstitial type opacities which may reflect atypical/viral infection or early edema. Electronically Signed   By: Logan Bores M.D.   On: 07/10/2019 18:46    EKG:  EKG: unchanged from previous tracings, normal sinus rhythm, LBBB. Vent. rate 80 BPM PR interval * ms QRS duration 155 ms QT/QTc 427/493 ms P-R-T axes 54 -47 79 IMPRESSION AND PLAN:   85 y.o. female with pertinent past medical history of OSA, chronic pain syndrome, cellulitis of RLL, dementia, hyperlipidemia, idiopathic thrombocytopenia, hypertension, anxiety and depression,  hypothyroidism, and asthma presenting to the ED with altered mental status.  1. Altered mental status -likely due to underlying atypical pneumonia noted on chest x-ray. - Admit to MedSurg unit - CT head negative for acute intracranial abnormality - Blood cultures pending - Urine cultures pending - Empiric with azithromycin and ceftriaxone - IV fluids hydration  2. Hypertension -stable not requiring any antihypertensives  3. Chronic pain syndrome - Continue gabapentin, tramadol and Norco  4. Hypothyroidism-continue Synthroid  5. Anxiety and depression -continue citalopram, BuSpar and Xanax as needed  6. Hyperlipidemia - Pravastatin '20mg'$  PO qhs  7. Alzheimer's dementia without behavioral disturbances - Continue Namenda and donepezil  8. DVT prophylaxis - Enoxaparin SubQ   All the records are reviewed and case discussed with ED provider. Management plans discussed with the patient, family and they are in agreement.  CODE STATUS: FULL  TOTAL TIME TAKING CARE OF THIS PATIENT: 50 minutes.    on 07/11/2019 at 1:10 AM  Rufina Falco, DNP, FNP-BC [facility] Nurse Practitioner Between 7am to 6pm - Pager [phone]  After  6pm go to [email or web address] - password [email or web address] [place] [facility]  Office  [phone]  CC: Primary care physician; Idelle Crouch, MD

## 2019-07-11 NOTE — Progress Notes (Signed)
Attempted to see pt's family. Pt is alone. Will continue to f/u to discuss D/C plan.

## 2019-07-11 NOTE — Progress Notes (Signed)
Patient admitted early this morning.  Seen and examined by me later in the morning.  Please see H&P for further details.  Patient states she is feeling well this morning.  She feels like her shortness of breath has improved.  She denies any cough, fevers, chills.  On exam, RRR.  Lungs clear to auscultation bilaterally.  Normal work of breathing on room air.  CAP- seen on chest x-ray.  Patient endorses shortness of breath and dyspnea on exertion.  Not meeting sepsis criteria on admission. -Continue ceftriaxone and azithromycin -Follow-up blood and urine cultures -Check procalcitonin and RVP -Will also check BNP and ECHO to rule out heart failure as a cause of her dyspnea on exertion  Acute metabolic encephalopathy-may be due to above.  Altered mental status has improved this morning -Will continue to monitor  Seen by PT, who recommended SNF.  Will discuss dispo with patient and husband.  Hyman Bible, MD

## 2019-07-11 NOTE — Progress Notes (Signed)
PT Cancellation Note  Patient Details Name: Ariana [name] MRN: [id number] DOB: 06-May-1933   Cancelled Treatment:    Reason Eval/Treat Not Completed: (Consult received and chart reviewed.  Patient currently with nursing for morning medication/rounds. Will re-attempt at later time/date as medically appropriate and available.)   Mc Hollen H. Owens Shark, PT, DPT, NCS 07/11/19, 10:26 AM [phone]

## 2019-07-12 LAB — BASIC METABOLIC PANEL
Anion gap: 8 (ref 5–15)
BUN: 12 mg/dL (ref 8–23)
CO2: 25 mmol/L (ref 22–32)
Calcium: 8.7 mg/dL — ABNORMAL LOW (ref 8.9–10.3)
Chloride: 107 mmol/L (ref 98–111)
Creatinine, Ser: 0.71 mg/dL (ref 0.44–1.00)
GFR calc Af Amer: >60 mL/min (ref >60)
GFR calc non Af Amer: >60 mL/min (ref >60)
Glucose, Bld: 81 mg/dL (ref 70–99)
Potassium: 3.6 mmol/L (ref 3.5–5.1)
Sodium: 140 mmol/L (ref 135–145)

## 2019-07-12 LAB — CBC
HCT: 34.6 % — ABNORMAL LOW (ref 36.0–46.0)
Hemoglobin: 10.7 g/dL — ABNORMAL LOW (ref 12.0–15.0)
MCH: 28 pg (ref 26.0–34.0)
MCHC: 30.9 g/dL (ref 30.0–36.0)
MCV: 90.6 fL (ref 80.0–100.0)
Platelets: 269 10*3/uL (ref 150–400)
RBC: 3.82 MIL/uL — ABNORMAL LOW (ref 3.87–5.11)
RDW: 16.7 % — ABNORMAL HIGH (ref 11.5–15.5)
WBC: 17 10*3/uL — ABNORMAL HIGH (ref 4.0–10.5)
nRBC: 0 % (ref 0.0–0.2)

## 2019-07-12 LAB — ECHOCARDIOGRAM COMPLETE
Height: 68 in
Weight: 2239.87 oz

## 2019-07-12 LAB — HIV ANTIBODY (ROUTINE TESTING W REFLEX): HIV Screen 4th Generation wRfx: NONREACTIVE — AB

## 2019-07-12 MED ORDER — CEFDINIR 300 MG PO CAPS: 300.0000 mg | ORAL_CAPSULE | Freq: Two times a day (BID) | ORAL | 0 refills | Status: DC

## 2019-07-12 MED ORDER — AZITHROMYCIN 250 MG PO TABS: 250.0000 mg | ORAL_TABLET | Freq: Every day | ORAL | 0 refills | Status: DC

## 2019-07-12 MED ORDER — NYSTATIN 100000 UNIT/ML MT SUSP: 5.0000 mL | Freq: Four times a day (QID) | OROMUCOSAL | Status: DC

## 2019-07-12 MED ORDER — NYSTATIN 100000 UNIT/ML MT SUSP: 5.0000 mL | Freq: Four times a day (QID) | OROMUCOSAL | 0 refills | Status: DC

## 2019-07-12 MED ORDER — IPRATROPIUM-ALBUTEROL 0.5-2.5 (3) MG/3ML IN SOLN: 3.0000 mL | RESPIRATORY_TRACT | Status: DC | PRN

## 2019-07-12 NOTE — Plan of Care (Signed)
Pt is disoriented x4, with poor safety awareness, can be redirected and follow commands attempted to get out of bed x2, bed sitter monitoring on, intake moderately UOP via purewick, bed alarm activated for safety, VSS, O2 Sats on RA, will continue to monitor.  Problem: Education: Goal: Knowledge of General Education information will improve Description: Including pain rating scale, medication(s)/side effects and non-pharmacologic comfort measures Outcome: Progressing   Problem: Health Behavior/Discharge Planning: Goal: Ability to manage health-related needs will improve Outcome: Progressing   Problem: Clinical Measurements: Goal: Ability to maintain clinical measurements within normal limits will improve Outcome: Progressing Goal: Will remain free from infection Outcome: Progressing Goal: Diagnostic test results will improve Outcome: Progressing Goal: Respiratory complications will improve Outcome: Progressing Goal: Cardiovascular complication will be avoided Outcome: Progressing   Problem: Activity: Goal: Risk for activity intolerance will decrease Outcome: Progressing   Problem: Nutrition: Goal: Adequate nutrition will be maintained Outcome: Progressing   Problem: Coping: Goal: Level of anxiety will decrease Outcome: Progressing   Problem: Elimination: Goal: Will not experience complications related to bowel motility Outcome: Progressing Goal: Will not experience complications related to urinary retention Outcome: Progressing   Problem: Pain Managment: Goal: General experience of comfort will improve Outcome: Progressing   Problem: Safety: Goal: Ability to remain free from injury will improve Outcome: Progressing   Problem: Skin Integrity: Goal: Risk for impaired skin integrity will decrease Outcome: Progressing   Problem: Health Behavior/Discharge Planning: Goal: Ability to manage health-related needs will improve Outcome: Progressing   Problem: Clinical  Measurements: Goal: Ability to maintain clinical measurements within normal limits will improve Outcome: Progressing   Problem: Clinical Measurements: Goal: Respiratory complications will improve Outcome: Progressing

## 2019-07-12 NOTE — TOC Initial Note (Signed)
Transition of Care [facility]) - Initial/Assessment Note    Patient Details  Name: Ariana [name] MRN: [id number] Date of Birth: [birth year]  Transition of Care [facility]) CM/SW Contact:    Beverly Sessions, RN Phone Number: 07/12/2019, 3:20 PM  Clinical Narrative:                 Patient admitted from home with PNA Patient is not alert and oriented.  Assessment completed with husband via phone  Patient lives at home with husband Has RW, shower seat, cane, elevated toilet PT has assessed patient and recommends SNF.  Husband politely declines, and states he feels like she will do better in an environment she is familiar with.   MD has ordered home health PT.  Husband states that she had been seen by Gerald Stabs from [facility] and would really like to use them again.  Referral made and accepted by Corene Cornea from [place]   Expected Discharge Plan: [place]     Patient Goals and CMS Choice        Expected Discharge Plan and Services Expected Discharge Plan: [place]       Living arrangements for the past 2 months: Single Family Home Expected Discharge Date: 07/12/19                         HH Arranged: RN, PT, Nurse's Aide [place] Agency: [facility] ([place]) Date HH Agency Contacted: 07/12/19 Time HH Agency Contacted: 1150 Representative spoke with at [facility]: [place] Arrangements/Services Living arrangements for the past 2 months: [place] with:: Spouse Patient language and need for interpreter reviewed:: Yes        Need for Family Participation in Patient Care: Yes (Comment) Care giver support system in place?: Yes (comment)   Criminal Activity/Legal Involvement Pertinent to Current Situation/Hospitalization: No - Comment as needed  Activities of Daily Living      Permission Sought/Granted                  Emotional Assessment              Admission  diagnosis:  Altered mental status, unspecified altered mental status type [R41.82] Pneumonia due to infectious organism, unspecified laterality, unspecified part of lung [J18.9] Patient Active Problem List   Diagnosis Date Noted  . Pneumonia 07/10/2019  . Laceration of left lower leg 04/10/2019  . Chronic pain syndrome 01/11/2019  . OSA (obstructive sleep apnea) 09/28/2018  . Ulcer of lower limb, right, limited to breakdown of skin ([place]) 07/22/2018  . Traumatic hematoma of right lower leg 07/12/2018  . Asthma without status asthmaticus 11/25/2016  . Late onset Alzheimer's disease without behavioral disturbance ([place]) 12/30/2015  . Back muscle spasm 09/09/2015  . Clinical depression 04/12/2015  . Herpes zona 04/12/2015  . Chronic ITP (idiopathic thrombocytopenic purpura) ([facility]) 03/15/2015  . Chest pain 10/22/2014  . Breathlessness on exertion 10/22/2014  . Osteopenia 10/18/2014  . Benign essential HTN 06/28/2014  . HLD (hyperlipidemia) 06/28/2014  . Idiopathic thrombocythemia ([place]) 06/28/2014  . Acquired hypothyroidism 06/04/2014  . Anxiety and depression 06/04/2014  . Bursitis, trochanteric 03/27/2014  . DDD (degenerative disc disease), lumbar 02/27/2014  . Neuritis or radiculitis due to rupture of lumbar intervertebral disc 02/27/2014  . Lumbar radiculitis 02/27/2014   PCP:  Idelle Crouch, MD Pharmacy:   [place], [place] - [id number]  [place] [id number] Phone: [phone] Fax: [facility] - [place], [place] [address] Phone: [phone] Fax: [phone]     Social Determinants of Health (SDOH) Interventions    Readmission Risk Interventions No flowsheet data found.

## 2019-07-12 NOTE — Discharge Summary (Signed)
[facility] [place] NAME: Ariana [name]    MR#:  [id number]  DATE OF BIRTH:  1933-09-12  DATE OF ADMISSION:  07/10/2019   ADMITTING PHYSICIAN: Lang Snow, NP  DATE OF DISCHARGE: 07/12/19  PRIMARY CARE PHYSICIAN: Idelle Crouch, MD   ADMISSION DIAGNOSIS:  Altered mental status, unspecified altered mental status type [R41.82] Pneumonia due to infectious organism, unspecified laterality, unspecified part of lung [J18.9] DISCHARGE DIAGNOSIS:  Active Problems:   Pneumonia  SECONDARY DIAGNOSIS:   Past Medical History:  Diagnosis Date  . Alzheimer disease ([place])   . Anemia   . Anxiety   . Asthma   . Chronic back pain   . Depression   . Depression   . GERD (gastroesophageal reflux disease)   . Hiatal hernia   . Hypercholesteremia   . ITP (idiopathic thrombocytopenic purpura)   . Osteoarthritis   . Osteoporosis   . Pulmonary emboli ([place])   . Restless legs    HOSPITAL COURSE:   Ariana [name] is an 85 year old female who presented to the ED with shortness of breath and confusion.  In the ED,  CT head and CT C-spine were unremarkable.  Chest x-ray showed atypical pneumonia.  She was admitted for further management.  CAP- not meeting sepsis criteria on admission. -Treated with ceftriaxone and azithromycin and then transitioned to [facility] and p.o. azithromycin for a total 5-day course -Blood cultures with no growth -RVP was negative -PT recommended SNF, but patient and husband preferred that patient be discharged home with home health services  Dyspnea on exertion- may be due to above versus untreated sleep apnea. -ECHO was unremarkable -Needs to follow-up with pulmonologist as an outpatient  Acute metabolic encephalopathy- resolved.  Patient was at mental status baseline on the day of discharge.  Chronic pain syndrome -Continued home gabapentin, tramadol, Norco  Hypothyroidism -Continued home  Synthroid  Anxiety/depression -Continued home citalopram, BuSpar, Xanax  Hyperlipidemia -Continued home pravastatin  Alzheimer's dementia -Continued home Namenda and Aricept  DISCHARGE CONDITIONS:  Community-acquired pneumonia Chronic pain syndrome Hypothyroidism Anxiety Depression Hyperlipidemia Alzheimer's dementia CONSULTS OBTAINED:  None DRUG ALLERGIES:   Allergies  Allergen Reactions  . Aspirin     Upset stomach  . Diazepam Itching    sneezing  . Other     seasonal allergies  . Tetanus Toxoids     Localized superficial swelling of skin  . Morphine Itching and Rash   DISCHARGE MEDICATIONS:   Allergies as of 07/12/2019      Reactions   Aspirin    Upset stomach   Diazepam Itching   sneezing   Other    seasonal allergies   Tetanus Toxoids    Localized superficial swelling of skin   Morphine Itching, Rash      Medication List    TAKE these medications   acetaminophen 325 MG tablet Commonly known as: TYLENOL Take 2 tablets (650 mg total) by mouth every 6 (six) hours as needed for mild pain (or Fever >/= 101).   albuterol 108 (90 Base) MCG/ACT inhaler Commonly known as: VENTOLIN HFA Inhale 2 puffs into the lungs every 6 (six) hours as needed for wheezing or shortness of breath.   ALPRAZolam 0.5 MG tablet Commonly known as: XANAX Take 1 tablet (0.5 mg total) by mouth 3 (three) times daily as needed for anxiety.   azithromycin 250 MG tablet Commonly known as: Zithromax Z-Pak Take 1 tablet (250 mg total) by mouth daily.   buPROPion 150  MG 24 hr tablet Commonly known as: WELLBUTRIN XL Take 150 mg by mouth daily.   busPIRone 15 MG tablet Commonly known as: BUSPAR Take 15 mg by mouth 2 (two) times daily.   Calcium 600+D Plus Minerals 600-400 MG-UNIT Tabs Take 1 tablet by mouth daily.   cefdinir 300 MG capsule Commonly known as: OMNICEF Take 1 capsule (300 mg total) by mouth 2 (two) times daily.   cholecalciferol 25 MCG (1000 UT)  tablet Commonly known as: VITAMIN D3 Take 1,000 Units by mouth 2 (two) times a day.   citalopram 40 MG tablet Commonly known as: CELEXA Take 40 mg by mouth daily.   cyclobenzaprine 5 MG tablet Commonly known as: FLEXERIL Take 5 mg by mouth 3 (three) times daily as needed for muscle spasms.   donepezil 10 MG tablet Commonly known as: ARICEPT Take 10 mg by mouth at bedtime.   E-400 400 UNIT capsule Generic drug: vitamin E Take 400 Units by mouth daily.   fluticasone 50 MCG/ACT nasal spray Commonly known as: FLONASE Place 2 sprays into both nostrils daily as needed for allergies.   Fluticasone-Salmeterol 250-50 MCG/DOSE Aepb Commonly known as: ADVAIR Inhale 1 puff into the lungs daily as needed (shortness of breath).   gabapentin 300 MG capsule Commonly known as: NEURONTIN Take 300 mg by mouth 4 (four) times daily.   HYDROcodone-acetaminophen 5-325 MG tablet Commonly known as: Norco Take 1 tablet by mouth every 6 (six) hours as needed for moderate pain.   levothyroxine 50 MCG tablet Commonly known as: SYNTHROID Take 50 mcg by mouth daily before breakfast. Take 30 to 60 minutes before breakfast.   LUBRICATING EYE DROPS OP Place 1 drop into both eyes daily as needed (dry eyes).   memantine 10 MG tablet Commonly known as: NAMENDA Take 10 mg by mouth daily.   montelukast 10 MG tablet Commonly known as: SINGULAIR Take 10 mg by mouth at bedtime.   Multi-Vitamin tablet Take 1 tablet by mouth daily.   Narcan 4 MG/0.1ML Liqd nasal spray kit Generic drug: naloxone Place 1 spray into the nose once.   oxybutynin 5 MG tablet Commonly known as: DITROPAN Take 5 mg by mouth 3 (three) times daily.   pantoprazole 20 MG tablet Commonly known as: PROTONIX Take 20 mg by mouth 2 (two) times daily.   pravastatin 20 MG tablet Commonly known as: PRAVACHOL Take 20 mg by mouth daily.   PreserVision AREDS 2 Caps Take 1 capsule by mouth 2 (two) times a day.   RA Vitamin B-12  TR 1000 MCG Tbcr Generic drug: Cyanocobalamin Take 1,000 mcg by mouth daily.   ranitidine 300 MG tablet Commonly known as: ZANTAC Take 300 mg by mouth at bedtime.   traMADol 50 MG tablet Commonly known as: ULTRAM Take 50 mg by mouth 2 (two) times daily.   vitamin C 500 MG tablet Commonly known as: ASCORBIC ACID Take 500 mg by mouth daily.        DISCHARGE INSTRUCTIONS:  1.  Follow-up with PCP in 5 days 2.  Follow-up with pulmonology in 2 weeks 3.  Take azithromycin daily for 3 days 4.  Take cefdinir twice a day, starting this evening  DIET:  Cardiac diet DISCHARGE CONDITION:  Stable ACTIVITY:  Activity as tolerated OXYGEN:  Home Oxygen: No.  Oxygen Delivery: room air DISCHARGE LOCATION:  home   If you experience worsening of your admission symptoms, develop shortness of breath, life threatening emergency, suicidal or homicidal thoughts you must seek medical attention  immediately by calling 911 or calling your MD immediately  if symptoms less severe.  You Must read complete instructions/literature along with all the possible adverse reactions/side effects for all the Medicines you take and that have been prescribed to you. Take any new Medicines after you have completely understood and accpet all the possible adverse reactions/side effects.   Please note  You were cared for by a hospitalist during your hospital stay. If you have any questions about your discharge medications or the care you received while you were in the hospital after you are discharged, you can call the unit and asked to speak with the hospitalist on call if the hospitalist that took care of you is not available. Once you are discharged, your primary care physician will handle any further medical issues. Please note that NO REFILLS for any discharge medications will be authorized once you are discharged, as it is imperative that you return to your primary care physician (or establish a relationship with a  primary care physician if you do not have one) for your aftercare needs so that they can reassess your need for medications and monitor your lab values.    On the day of Discharge:  VITAL SIGNS:  Blood pressure 128/64, pulse 76, temperature 98.6 F (37 C), temperature source Oral, resp. rate 16, height _0  (1.727 m), weight 63.5 kg, SpO2 94 %. PHYSICAL EXAMINATION:  GENERAL:  85 y.o.-year-old patient sitting up in chair with no acute distress.  Well-appearing. EYES: Pupils equal, round, reactive to light and accommodation. No scleral icterus. Extraocular muscles intact.  HEENT: Head atraumatic, normocephalic. Oropharynx and nasopharynx clear.  NECK:  Supple, no jugular venous distention. No thyroid enlargement, no tenderness.  LUNGS: Normal breath sounds bilaterally, no wheezing, rales,rhonchi or crepitation. No use of accessory muscles of respiration.  CARDIOVASCULAR: RRR, S1, S2 normal. No murmurs, rubs, or gallops.  ABDOMEN: Soft, non-tender, non-distended. Bowel sounds present. No organomegaly or mass.  EXTREMITIES: No pedal edema, cyanosis, or clubbing.  NEUROLOGIC: Cranial nerves II through XII are intact. Muscle strength 5/5 in all extremities. Sensation intact. Gait not checked.  PSYCHIATRIC: The patient is alert and oriented x 3.  SKIN: No obvious rash, lesion, or ulcer.  DATA REVIEW:   CBC Recent Labs  Lab 07/12/19 0355  WBC 17.0*  HGB 10.7*  HCT 34.6*  PLT 269    Chemistries  Recent Labs  Lab 07/11/19 0533 07/12/19 0355  NA 141 140  K 3.7 3.6  CL 108 107  CO2 25 25  GLUCOSE 77 81  BUN 14 12  CREATININE 0.67 0.71  CALCIUM 8.4* 8.7*  AST 48*  --   ALT 30  --   ALKPHOS 77  --   BILITOT 0.7  --      Microbiology Results  Results for orders placed or performed during the hospital encounter of 07/10/19  Blood culture (routine x 2)     Status: None (Preliminary result)   Collection Time: 07/10/19  5:42 PM   Specimen: BLOOD  Result Value Ref Range Status    Specimen Description BLOOD RIGHT ANTECUBITAL  Final   Special Requests   Final    BOTTLES DRAWN AEROBIC AND ANAEROBIC Blood Culture adequate volume   Culture   Final    NO GROWTH 2 DAYS Performed at [facility], [address] [place], [place] [address]    Report Status PENDING  Incomplete  Blood culture (routine x 2)     Status: None (Preliminary result)  Collection Time: 07/10/19  5:42 PM   Specimen: BLOOD  Result Value Ref Range Status   Specimen Description BLOOD BLOOD LEFT HAND  Final   Special Requests   Final    BOTTLES DRAWN AEROBIC AND ANAEROBIC Blood Culture adequate volume   Culture   Final    NO GROWTH 2 DAYS Performed at [facility], [address], [place], [place] [address]    Report Status PENDING  Incomplete  SARS Coronavirus 2 [facility] order, Performed in [facility] hospital lab) Nasopharyngeal Nasopharyngeal Swab     Status: None   Collection Time: 07/10/19  6:22 PM   Specimen: Nasopharyngeal Swab  Result Value Ref Range Status   SARS Coronavirus 2 NEGATIVE NEGATIVE Final    Comment: (NOTE) If result is NEGATIVE SARS-CoV-2 target nucleic acids are NOT DETECTED. The SARS-CoV-2 RNA is generally detectable in upper and lower  respiratory specimens during the acute phase of infection. The lowest  concentration of SARS-CoV-2 viral copies this assay can detect is 250  copies / mL. A negative result does not preclude SARS-CoV-2 infection  and should not be used as the sole basis for treatment or other  patient management decisions.  A negative result may occur with  improper specimen collection / handling, submission of specimen other  than nasopharyngeal swab, presence of viral mutation(s) within the  areas targeted by this assay, and inadequate number of viral copies  (<250 copies / mL). A negative result must be combined with clinical  observations, patient history, and epidemiological information. If result is POSITIVE SARS-CoV-2  target nucleic acids are DETECTED. The SARS-CoV-2 RNA is generally detectable in upper and lower  respiratory specimens dur ing the acute phase of infection.  Positive  results are indicative of active infection with SARS-CoV-2.  Clinical  correlation with patient history and other diagnostic information is  necessary to determine patient infection status.  Positive results do  not rule out bacterial infection or co-infection with other viruses. If result is PRESUMPTIVE POSTIVE SARS-CoV-2 nucleic acids MAY BE PRESENT.   A presumptive positive result was obtained on the submitted specimen  and confirmed on repeat testing.  While 2019 novel coronavirus  (SARS-CoV-2) nucleic acids may be present in the submitted sample  additional confirmatory testing may be necessary for epidemiological  and / or clinical management purposes  to differentiate between  SARS-CoV-2 and other Sarbecovirus currently known to infect humans.  If clinically indicated additional testing with an alternate test  methodology [phone]) is advised. The SARS-CoV-2 RNA is generally  detectable in upper and lower respiratory sp ecimens during the acute  phase of infection. The expected result is Negative. Fact Sheet for Patients:  StrictlyIdeas.no Fact Sheet for Healthcare Providers: BankingDealers.co.za This test is not yet approved or cleared by the [place] FDA and has been authorized for detection and/or diagnosis of SARS-CoV-2 by FDA under an Emergency Use Authorization (EUA).  This EUA will remain in effect (meaning this test can be used) for the duration of the COVID-19 declaration under Section 564(b)(1) of the Act, 21 U.S.C. section 360bbb-3(b)(1), unless the authorization is terminated or revoked sooner. Performed at [facility], [address] [place], [place] [address]   Respiratory Panel by PCR     Status: None   Collection Time: 07/11/19   7:47 AM   Specimen: Nasopharyngeal Swab; Respiratory  Result Value Ref Range Status   Adenovirus NOT DETECTED NOT DETECTED Final   Coronavirus 229E NOT DETECTED NOT DETECTED Final  Comment: (NOTE) The Coronavirus on the Respiratory Panel, DOES NOT test for the novel  Coronavirus (2019 nCoV)    Coronavirus HKU1 NOT DETECTED NOT DETECTED Final   Coronavirus NL63 NOT DETECTED NOT DETECTED Final   Coronavirus OC43 NOT DETECTED NOT DETECTED Final   Metapneumovirus NOT DETECTED NOT DETECTED Final   Rhinovirus / Enterovirus NOT DETECTED NOT DETECTED Final   Influenza A NOT DETECTED NOT DETECTED Final   Influenza B NOT DETECTED NOT DETECTED Final   Parainfluenza Virus 1 NOT DETECTED NOT DETECTED Final   Parainfluenza Virus 2 NOT DETECTED NOT DETECTED Final   Parainfluenza Virus 3 NOT DETECTED NOT DETECTED Final   Parainfluenza Virus 4 NOT DETECTED NOT DETECTED Final   Respiratory Syncytial Virus NOT DETECTED NOT DETECTED Final   Bordetella pertussis NOT DETECTED NOT DETECTED Final   Chlamydophila pneumoniae NOT DETECTED NOT DETECTED Final   Mycoplasma pneumoniae NOT DETECTED NOT DETECTED Final    Comment: Performed at [facility], [place] [address], [place], [place] [address]    RADIOLOGY:  No results found.   Management plans discussed with the patient, family and they are in agreement.  CODE STATUS: Full Code   TOTAL TIME TAKING CARE OF THIS PATIENT: 40 minutes.    Berna Spare Jacaria Colburn M.D on 07/12/2019 at 9:20 AM  Between 7am to 6pm - Pager [phone]  After 6pm go to [email or web address] - Proofreader  [facility]  Office  [phone]  CC: Primary care physician; Idelle Crouch, MD   Note: This dictation was prepared with Dragon dictation along with smaller phrase technology. Any transcriptional errors that result from this process are unintentional.

## 2019-07-12 NOTE — Plan of Care (Signed)
Discharge order received. Patient mental status is at baseline. Vital signs stable . No signs of acute distress. Discharge instructions given to Pt husband.Patient' Husband verbalized understanding. No other issues noted at this time.

## 2019-07-12 NOTE — Progress Notes (Signed)
Physical Therapy Treatment Patient Details Name: Athel Chiera MRN: [id number] DOB: 08-10-1933 Today's Date: 07/12/2019    History of Present Illness presented to ER secondary to worsening AMS, progressive weakness, multiple falls; admitted for management of PNA.    PT Comments    Pt in bed stated she is sleepy but wants to get up to walk.  To edge of bed with min a x 1.  Stood with min a x 1 and was able to progress gait to 100' with walker and min/mod a x 1.  Pt continues with choppy gait pattern, walker veering to right and pt aware and commenting on it but continues to have difficulty correcting it, running into walls and objects, walks too far outside walker box despite multiple cues to keep walker closer, decreased step height and length.  Pt continues to require +1 assist at all times for safety and guidance.  SNF remains appropriate upon discharge.   Follow Up Recommendations  SNF     Equipment Recommendations  Rolling walker with 5" wheels    Recommendations for Other Services       Precautions / Restrictions Precautions Precautions: Fall Restrictions Weight Bearing Restrictions: No    Mobility  Bed Mobility Overal bed mobility: Needs Assistance Bed Mobility: Supine to Sit     Supine to sit: Min assist     General bed mobility comments: requires use of bedrails to complete supine to sit  Transfers Overall transfer level: Needs assistance Equipment used: Rolling walker (2 wheeled) Transfers: Sit to/from Stand Sit to Stand: Min assist            Ambulation/Gait Ambulation/Gait assistance: Min assist;Mod assist Gait Distance (Feet): 100 Feet Assistive device: Rolling walker (2 wheeled)   Gait velocity: decreased   General Gait Details: choppy gait performance, generally unsteady; veers R, often bumping/stopping walker into obstacles, requiring assist from therapist for correction (and constant min/mod assist to maintain pathway once  corrected)   Stairs             Wheelchair Mobility    Modified Rankin (Stroke Patients Only)       Balance Overall balance assessment: Needs assistance Sitting-balance support: No upper extremity supported;Feet supported Sitting balance-Leahy Scale: Good     Standing balance support: Bilateral upper extremity supported Standing balance-Leahy Scale: Poor                              Cognition Arousal/Alertness: Awake/alert Behavior During Therapy: WFL for tasks assessed/performed Overall Cognitive Status: History of cognitive impairments - at baseline                                 General Comments: oriented to self, general location; unaware of date.  Follows simple commands, pleasant and cooperative.  Limited insight into deficits, limited integration of new information.      Exercises      General Comments        Pertinent Vitals/Pain Pain Assessment: Faces Faces Pain Scale: No hurt Pain Location: general stiffness from immobility    Home Living                      Prior Function            PT Goals (current goals can now be found in the care plan section) Progress towards PT goals: Progressing toward  goals    Frequency    Min 2X/week      PT Plan Current plan remains appropriate    Co-evaluation              AM-PAC PT "6 Clicks" Mobility   Outcome Measure  Help needed turning from your back to your side while in a flat bed without using bedrails?: A Little Help needed moving from lying on your back to sitting on the side of a flat bed without using bedrails?: A Little Help needed moving to and from a bed to a chair (including a wheelchair)?: A Little Help needed standing up from a chair using your arms (e.g., wheelchair or bedside chair)?: A Lot Help needed to walk in hospital room?: A Lot Help needed climbing 3-5 steps with a railing? : A Lot 6 Click Score: 15    End of Session Equipment  Utilized During Treatment: Gait belt Activity Tolerance: Patient tolerated treatment well Patient left: in chair;with call bell/phone within reach;with chair alarm set Nurse Communication: Mobility status       Time: 1202-1221 PT Time Calculation (min) (ACUTE ONLY): 19 min  Charges:  $Gait Training: 8-22 mins                    Chesley Noon, PTA 07/12/19, 12:34 PM

## 2019-07-12 NOTE — Discharge Instructions (Signed)
It was so nice to meet you during this hospitalization!  You came into the hospital with shortness of breath. We found that you had a pneumonia.  I have prescribed the following medications: 1. Please take Azithromycin 250mg  once tonight and then continue daily for 2 more days 2. Please take Cefdinir (Omnicef) once tonight and then continue twice a day for 2 more days 3. We found that you had some thrush, which is a fungal infection of your mouth. I have prescribed some nystatin. Please use this 4 times per day for the next 7 days.   Take care, Dr. Brett Albino

## 2019-07-14 DIAGNOSIS — I10 Essential (primary) hypertension: Secondary | ICD-10-CM | POA: Diagnosis not present

## 2019-07-14 DIAGNOSIS — D473 Essential (hemorrhagic) thrombocythemia: Secondary | ICD-10-CM | POA: Diagnosis not present

## 2019-07-14 DIAGNOSIS — M549 Dorsalgia, unspecified: Secondary | ICD-10-CM | POA: Diagnosis not present

## 2019-07-14 DIAGNOSIS — Z0000 Encounter for general adult medical examination without abnormal findings: Secondary | ICD-10-CM | POA: Diagnosis not present

## 2019-07-14 DIAGNOSIS — M810 Age-related osteoporosis without current pathological fracture: Secondary | ICD-10-CM | POA: Diagnosis not present

## 2019-07-14 DIAGNOSIS — E7800 Pure hypercholesterolemia, unspecified: Secondary | ICD-10-CM | POA: Diagnosis not present

## 2019-07-14 DIAGNOSIS — Z79899 Other long term (current) drug therapy: Secondary | ICD-10-CM | POA: Diagnosis not present

## 2019-07-14 DIAGNOSIS — G301 Alzheimer's disease with late onset: Secondary | ICD-10-CM | POA: Diagnosis not present

## 2019-07-14 DIAGNOSIS — G894 Chronic pain syndrome: Secondary | ICD-10-CM | POA: Diagnosis not present

## 2019-07-14 DIAGNOSIS — Z23 Encounter for immunization: Secondary | ICD-10-CM | POA: Diagnosis not present

## 2019-07-14 DIAGNOSIS — F0280 Dementia in other diseases classified elsewhere without behavioral disturbance: Secondary | ICD-10-CM | POA: Diagnosis not present

## 2019-07-14 DIAGNOSIS — J189 Pneumonia, unspecified organism: Secondary | ICD-10-CM | POA: Diagnosis not present

## 2019-07-14 DIAGNOSIS — M1990 Unspecified osteoarthritis, unspecified site: Secondary | ICD-10-CM | POA: Diagnosis not present

## 2019-07-14 DIAGNOSIS — M5416 Radiculopathy, lumbar region: Secondary | ICD-10-CM | POA: Diagnosis not present

## 2019-07-14 DIAGNOSIS — G309 Alzheimer's disease, unspecified: Secondary | ICD-10-CM | POA: Diagnosis not present

## 2019-07-14 DIAGNOSIS — E039 Hypothyroidism, unspecified: Secondary | ICD-10-CM | POA: Diagnosis not present

## 2019-07-14 DIAGNOSIS — M4312 Spondylolisthesis, cervical region: Secondary | ICD-10-CM | POA: Diagnosis not present

## 2019-07-15 LAB — CULTURE, BLOOD (ROUTINE X 2)
Culture: NO GROWTH
Culture: NO GROWTH
Special Requests: ADEQUATE
Special Requests: ADEQUATE

## 2019-07-18 DIAGNOSIS — M810 Age-related osteoporosis without current pathological fracture: Secondary | ICD-10-CM | POA: Diagnosis not present

## 2019-07-18 DIAGNOSIS — F0280 Dementia in other diseases classified elsewhere without behavioral disturbance: Secondary | ICD-10-CM | POA: Diagnosis not present

## 2019-07-18 DIAGNOSIS — I10 Essential (primary) hypertension: Secondary | ICD-10-CM | POA: Diagnosis not present

## 2019-07-18 DIAGNOSIS — G894 Chronic pain syndrome: Secondary | ICD-10-CM | POA: Diagnosis not present

## 2019-07-18 DIAGNOSIS — G309 Alzheimer's disease, unspecified: Secondary | ICD-10-CM | POA: Diagnosis not present

## 2019-07-18 DIAGNOSIS — J189 Pneumonia, unspecified organism: Secondary | ICD-10-CM | POA: Diagnosis not present

## 2019-07-18 DIAGNOSIS — M4312 Spondylolisthesis, cervical region: Secondary | ICD-10-CM | POA: Diagnosis not present

## 2019-07-18 DIAGNOSIS — M1990 Unspecified osteoarthritis, unspecified site: Secondary | ICD-10-CM | POA: Diagnosis not present

## 2019-07-18 DIAGNOSIS — M549 Dorsalgia, unspecified: Secondary | ICD-10-CM | POA: Diagnosis not present

## 2019-07-21 DIAGNOSIS — G894 Chronic pain syndrome: Secondary | ICD-10-CM | POA: Diagnosis not present

## 2019-07-21 DIAGNOSIS — I10 Essential (primary) hypertension: Secondary | ICD-10-CM | POA: Diagnosis not present

## 2019-07-21 DIAGNOSIS — F0280 Dementia in other diseases classified elsewhere without behavioral disturbance: Secondary | ICD-10-CM | POA: Diagnosis not present

## 2019-07-21 DIAGNOSIS — M4312 Spondylolisthesis, cervical region: Secondary | ICD-10-CM | POA: Diagnosis not present

## 2019-07-21 DIAGNOSIS — G309 Alzheimer's disease, unspecified: Secondary | ICD-10-CM | POA: Diagnosis not present

## 2019-07-21 DIAGNOSIS — M1990 Unspecified osteoarthritis, unspecified site: Secondary | ICD-10-CM | POA: Diagnosis not present

## 2019-07-21 DIAGNOSIS — J189 Pneumonia, unspecified organism: Secondary | ICD-10-CM | POA: Diagnosis not present

## 2019-07-21 DIAGNOSIS — M810 Age-related osteoporosis without current pathological fracture: Secondary | ICD-10-CM | POA: Diagnosis not present

## 2019-07-21 DIAGNOSIS — M549 Dorsalgia, unspecified: Secondary | ICD-10-CM | POA: Diagnosis not present

## 2019-07-24 DIAGNOSIS — M549 Dorsalgia, unspecified: Secondary | ICD-10-CM | POA: Diagnosis not present

## 2019-07-24 DIAGNOSIS — M1990 Unspecified osteoarthritis, unspecified site: Secondary | ICD-10-CM | POA: Diagnosis not present

## 2019-07-24 DIAGNOSIS — M810 Age-related osteoporosis without current pathological fracture: Secondary | ICD-10-CM | POA: Diagnosis not present

## 2019-07-24 DIAGNOSIS — I10 Essential (primary) hypertension: Secondary | ICD-10-CM | POA: Diagnosis not present

## 2019-07-24 DIAGNOSIS — M4312 Spondylolisthesis, cervical region: Secondary | ICD-10-CM | POA: Diagnosis not present

## 2019-07-24 DIAGNOSIS — G309 Alzheimer's disease, unspecified: Secondary | ICD-10-CM | POA: Diagnosis not present

## 2019-07-24 DIAGNOSIS — G894 Chronic pain syndrome: Secondary | ICD-10-CM | POA: Diagnosis not present

## 2019-07-24 DIAGNOSIS — J189 Pneumonia, unspecified organism: Secondary | ICD-10-CM | POA: Diagnosis not present

## 2019-07-24 DIAGNOSIS — F0280 Dementia in other diseases classified elsewhere without behavioral disturbance: Secondary | ICD-10-CM | POA: Diagnosis not present

## 2019-07-24 NOTE — Progress Notes (Signed)
[place]  [phone]  [phone]     Ariana [name] DOB: Mar 28, 1933  MR#: [id number]  MOQ#:[id number]  Patient Care Team: Idelle Crouch, MD as PCP - General (Internal Medicine)  CHIEF COMPLAINT: Chronic refractory ITP  INTERVAL HISTORY: Patient returns to clinic today for repeat laboratory work, further evaluation, and consideration of continuation of Nplate.  She complains of increased weakness and fatigue today, but denies any further falls or injuries. She continues to have increased confusion and is a poor historian.  She admits to several recent nosebleeds.  She has no neurologic complaints.  She denies any recent fevers or illnesses.  She denies any chest pain, shortness of breath, cough, or hemoptysis.  She denies any nausea, vomiting, constipation, or diarrhea.  She has no melena or hematochezia.  She has no urinary complaints.  Patient offers no further specific complaints today.  REVIEW OF SYSTEMS:   Review of Systems  Constitutional: Negative.  Negative for fever, malaise/fatigue and weight loss.  HENT: Negative for congestion.   Respiratory: Negative.  Negative for cough, hemoptysis and shortness of breath.   Cardiovascular: Negative.  Negative for chest pain and leg swelling.  Gastrointestinal: Negative.  Negative for abdominal pain, blood in stool, diarrhea, melena and nausea.  Genitourinary: Negative.  Negative for dysuria and hematuria.  Musculoskeletal: Negative.  Negative for back pain and falls.  Skin: Negative.  Negative for rash.  Neurological: Negative.  Negative for sensory change, focal weakness and weakness.  Endo/Heme/Allergies: Bruises/bleeds easily.  Psychiatric/Behavioral: Positive for memory loss. Negative for depression. The patient is not nervous/anxious.     As per HPI. Otherwise, a complete review of systems is negative.  PAST MEDICAL HISTORY: Past Medical History:  Diagnosis Date  . Alzheimer  disease ([place])   . Anemia   . Anxiety   . Asthma   . Chronic back pain   . Depression   . Depression   . GERD (gastroesophageal reflux disease)   . Hiatal hernia   . Hypercholesteremia   . ITP (idiopathic thrombocytopenic purpura)   . Osteoarthritis   . Osteoporosis   . Pulmonary emboli ([place])   . Restless legs     PAST SURGICAL HISTORY: Past Surgical History:  Procedure Laterality Date  . CARPAL TUNNEL RELEASE    . ESOPHAGOGASTRODUODENOSCOPY (EGD) WITH PROPOFOL N/A 09/10/2015   Procedure: ESOPHAGOGASTRODUODENOSCOPY (EGD) WITH PROPOFOL;  Surgeon: Lollie Sails, MD;  Location: [facility] ENDOSCOPY;  Service: Endoscopy;  Laterality: N/A;  . IRRIGATION AND DEBRIDEMENT HEMATOMA Right 07/13/2018   Procedure: IRRIGATION AND DEBRIDEMENT HEMATOMA-RIGHT SHIN;  Surgeon: Herbert Pun, MD;  Location: [facility] ORS;  Service: General;  Laterality: Right;  . KYPHOPLASTY N/A 05/02/2019   Procedure: L1 KYPHOPLASTY;  Surgeon: Hessie Knows, MD;  Location: [facility] ORS;  Service: Orthopedics;  Laterality: N/A;  . LUMBAR [place]    . SPLENECTOMY, PARTIAL    . TOTAL ABDOMINAL HYSTERECTOMY      FAMILY HISTORY Family History  Problem Relation Age of Onset  . Stroke Mother     GYNECOLOGIC HISTORY:  No LMP recorded. Patient is postmenopausal.     ADVANCED DIRECTIVES:    HEALTH MAINTENANCE: Social History   Tobacco Use  . Smoking status: Never Smoker  . Smokeless tobacco: Never Used  Substance Use Topics  . Alcohol use: No    Alcohol/week: 0.0 standard drinks  . Drug use: No     Allergies  Allergen Reactions  . Aspirin     Upset stomach  .  Diazepam Itching    sneezing  . Other     seasonal allergies  . Tetanus Toxoids     Localized superficial swelling of skin  . Morphine Itching and Rash    Current Outpatient Medications  Medication Sig Dispense Refill  . acetaminophen (TYLENOL) 325 MG tablet Take 2 tablets (650 mg total) by mouth every 6 (six) hours as needed for mild  pain (or Fever >/= 101). 100 tablet 0  . albuterol (PROVENTIL HFA;VENTOLIN HFA) 108 (90 BASE) MCG/ACT inhaler Inhale 2 puffs into the lungs every 6 (six) hours as needed for wheezing or shortness of breath.    . ALPRAZolam (XANAX) 0.5 MG tablet Take 1 tablet (0.5 mg total) by mouth 3 (three) times daily as needed for anxiety. 20 tablet 0  . azithromycin (ZITHROMAX Z-PAK) 250 MG tablet Take 1 tablet (250 mg total) by mouth daily. 3 each 0  . buPROPion (WELLBUTRIN XL) 150 MG 24 hr tablet Take 150 mg by mouth daily.    . busPIRone (BUSPAR) 15 MG tablet Take 15 mg by mouth 2 (two) times daily.     . Calcium Carbonate-Vit D-Min (CALCIUM 600+D PLUS MINERALS) 600-400 MG-UNIT TABS Take 1 tablet by mouth daily.     . Carboxymethylcellul-Glycerin (LUBRICATING EYE DROPS OP) Place 1 drop into both eyes daily as needed (dry eyes).    . cefdinir (OMNICEF) 300 MG capsule Take 1 capsule (300 mg total) by mouth 2 (two) times daily. 5 capsule 0  . cholecalciferol (VITAMIN D3) 25 MCG (1000 UT) tablet Take 1,000 Units by mouth 2 (two) times a day.    . citalopram (CELEXA) 40 MG tablet Take 40 mg by mouth daily.    . Cyanocobalamin (RA VITAMIN B-12 TR) 1000 MCG TBCR Take 1,000 mcg by mouth daily.     . cyclobenzaprine (FLEXERIL) 5 MG tablet Take 5 mg by mouth 3 (three) times daily as needed for muscle spasms.    Marland Kitchen donepezil (ARICEPT) 10 MG tablet Take 10 mg by mouth at bedtime.     . fluticasone (FLONASE) 50 MCG/ACT nasal spray Place 2 sprays into both nostrils daily as needed for allergies.     . Fluticasone-Salmeterol (ADVAIR) 250-50 MCG/DOSE AEPB Inhale 1 puff into the lungs daily as needed (shortness of breath).     . gabapentin (NEURONTIN) 300 MG capsule Take 300 mg by mouth 4 (four) times daily.    Marland Kitchen HYDROcodone-acetaminophen (NORCO) 5-325 MG tablet Take 1 tablet by mouth every 6 (six) hours as needed for moderate pain. 30 tablet 0  . levothyroxine (SYNTHROID, LEVOTHROID) 50 MCG tablet Take 50 mcg by mouth daily  before breakfast. Take 30 to 60 minutes before breakfast.    . montelukast (SINGULAIR) 10 MG tablet Take 10 mg by mouth at bedtime.     . Multiple Vitamin (MULTI-VITAMIN) tablet Take 1 tablet by mouth daily.     . Multiple Vitamins-Minerals (PRESERVISION AREDS 2) CAPS Take 1 capsule by mouth 2 (two) times a day.    Marland Kitchen NARCAN 4 MG/0.1ML LIQD nasal spray kit Place 1 spray into the nose once.     . nystatin (MYCOSTATIN) 100000 UNIT/ML suspension Take 5 mLs (500,000 Units total) by mouth 4 (four) times daily. 140 mL 0  . oxybutynin (DITROPAN) 5 MG tablet Take 5 mg by mouth 3 (three) times daily.    . pantoprazole (PROTONIX) 20 MG tablet Take 20 mg by mouth 2 (two) times daily.     . pravastatin (PRAVACHOL) 20 MG tablet Take  20 mg by mouth daily.    . ranitidine (ZANTAC) 300 MG tablet Take 300 mg by mouth at bedtime.     . traMADol (ULTRAM) 50 MG tablet Take 50 mg by mouth 2 (two) times daily.   5  . vitamin C (ASCORBIC ACID) 500 MG tablet Take 500 mg by mouth daily.    . vitamin E (E-400) 400 UNIT capsule Take 400 Units by mouth daily.    . memantine (NAMENDA) 10 MG tablet Take 10 mg by mouth daily.      No current facility-administered medications for this visit.     OBJECTIVE: BP (!) 132/58 (BP Location: Right Arm, Patient Position: Sitting)   Pulse (!) 54   Temp (!) 97.3 F (36.3 C) (Tympanic)   Wt 139 lb (63 kg)   BMI 21.13 kg/m    Body mass index is 21.13 kg/m.    ECOG FS:1 - Symptomatic but completely ambulatory  General: Well-developed, well-nourished, no acute distress. Eyes: Pink conjunctiva, anicteric sclera. HEENT: Normocephalic, moist mucous membranes. Lungs: Clear to auscultation bilaterally. Heart: Regular rate and rhythm. No rubs, murmurs, or gallops. Abdomen: Soft, nontender, nondistended. No organomegaly noted, normoactive bowel sounds. Musculoskeletal: No edema, cyanosis, or clubbing. Neuro: Alert, answering all questions appropriately. Cranial nerves grossly  intact. Skin: No rashes or petechiae noted.  Bruising noted on bilateral upper extremities. Psych: Normal affect.  LAB RESULTS:  Appointment on 07/28/2019  Component Date Value Ref Range Status  . WBC 07/28/2019 14.0* 4.0 - 10.5 K/uL Final  . RBC 07/28/2019 3.84* 3.87 - 5.11 MIL/uL Final  . Hemoglobin 07/28/2019 11.2* 12.0 - 15.0 g/dL Final  . HCT 07/28/2019 34.4* 36.0 - 46.0 % Final  . MCV 07/28/2019 89.6  80.0 - 100.0 fL Final  . MCH 07/28/2019 29.2  26.0 - 34.0 pg Final  . MCHC 07/28/2019 32.6  30.0 - 36.0 g/dL Final  . RDW 07/28/2019 17.0* 11.5 - 15.5 % Final  . Platelets 07/28/2019 28* 150 - 400 K/uL Final   Comment: REPEATED TO VERIFY PLATELET COUNT CONFIRMED BY SMEAR Immature Platelet Fraction may be clinically indicated, consider ordering this additional test [id number] THIS CRITICAL RESULT HAS VERIFIED AND BEEN CALLED TO DR. Virgin Zellers BY LONNIE RHONE ON 10 16 2020 AT 1026, AND HAS BEEN READ BACK. _0 :21AM   . nRBC 07/28/2019 0.0  0.0 - 0.2 % Final  . Neutrophils Relative % 07/28/2019 66  % Final  . Neutro Abs 07/28/2019 9.2* 1.7 - 7.7 K/uL Final  . Lymphocytes Relative 07/28/2019 25  % Final  . Lymphs Abs 07/28/2019 3.5  0.7 - 4.0 K/uL Final  . Monocytes Relative 07/28/2019 8  % Final  . Monocytes Absolute 07/28/2019 1.1* 0.1 - 1.0 K/uL Final  . Eosinophils Relative 07/28/2019 0  % Final  . Eosinophils Absolute 07/28/2019 0.0  0.0 - 0.5 K/uL Final  . Basophils Relative 07/28/2019 1  % Final  . Basophils Absolute 07/28/2019 0.1  0.0 - 0.1 K/uL Final  . Smear Review 07/28/2019 PLATELETS APPEAR DECREASED   Final   Comment: PLATELET COUNT CONFIRMED BY SMEAR PLATELETS VARY IN SIZE   . Immature Granulocytes 07/28/2019 0  % Final  . Abs Immature Granulocytes 07/28/2019 0.05  0.00 - 0.07 K/uL Final   Performed at [facility], [address] [place], [place] [address]    STUDIES: No results found.  ASSESSMENT:  Chronic refractory ITP  PLAN:   1. Chronic  refractory ITP: Patient had a poor response to Prednisone, WinRho, and  Rituxan. She is status post splenectomy in 2007.  She received IVIG and Nplate she received while at [facility] with significant improvement of her platelets.  [facility] has recommended weekly Nplate, but given this patient's comorbidities, weekly clinic visits would be difficult.  IVIG also works, but because of the visitor restrictions during COVID-19 patient likely would not do well sitting in the clinic alone all day for multiple days at a time.  Patient is currently receiving the max dose of Nplate at 10 mcg/kg.  Can also consider Promacta 12.98m or Tavalisse 100 mg BID if needed.  Patient's platelet count is 28 today, therefore will proceed with Nplate.  She continues to only require treatment every 4 weeks.  Return to clinic in 4 weeks for further evaluation and continuation of Nplate. 2. Weakness and fatigue: Chronic and unchanged. 3. Depression/dementia: Patient is now at [employer] [facility].  Anemia: Patient's hemoglobin is decreased, but stable at 11.2. 5.  Leukocytosis: Mild, monitor.  Patient expressed understanding and was in agreement with this plan. She also understands that She can call clinic at any time with any questions, concerns, or complaints.    TLloyd Huger MD   07/29/2019 6:34 PM

## 2019-07-25 DIAGNOSIS — G309 Alzheimer's disease, unspecified: Secondary | ICD-10-CM | POA: Diagnosis not present

## 2019-07-25 DIAGNOSIS — M1990 Unspecified osteoarthritis, unspecified site: Secondary | ICD-10-CM | POA: Diagnosis not present

## 2019-07-25 DIAGNOSIS — G894 Chronic pain syndrome: Secondary | ICD-10-CM | POA: Diagnosis not present

## 2019-07-25 DIAGNOSIS — J189 Pneumonia, unspecified organism: Secondary | ICD-10-CM | POA: Diagnosis not present

## 2019-07-25 DIAGNOSIS — I10 Essential (primary) hypertension: Secondary | ICD-10-CM | POA: Diagnosis not present

## 2019-07-25 DIAGNOSIS — M4312 Spondylolisthesis, cervical region: Secondary | ICD-10-CM | POA: Diagnosis not present

## 2019-07-25 DIAGNOSIS — M549 Dorsalgia, unspecified: Secondary | ICD-10-CM | POA: Diagnosis not present

## 2019-07-25 DIAGNOSIS — M810 Age-related osteoporosis without current pathological fracture: Secondary | ICD-10-CM | POA: Diagnosis not present

## 2019-07-25 DIAGNOSIS — F0280 Dementia in other diseases classified elsewhere without behavioral disturbance: Secondary | ICD-10-CM | POA: Diagnosis not present

## 2019-07-26 DIAGNOSIS — R05 Cough: Secondary | ICD-10-CM | POA: Diagnosis not present

## 2019-07-27 ENCOUNTER — Other Ambulatory Visit: Payer: Self-pay

## 2019-07-27 ENCOUNTER — Encounter: Payer: Self-pay | Admitting: Oncology

## 2019-07-27 DIAGNOSIS — M1990 Unspecified osteoarthritis, unspecified site: Secondary | ICD-10-CM | POA: Diagnosis not present

## 2019-07-27 DIAGNOSIS — I10 Essential (primary) hypertension: Secondary | ICD-10-CM | POA: Diagnosis not present

## 2019-07-27 DIAGNOSIS — G309 Alzheimer's disease, unspecified: Secondary | ICD-10-CM | POA: Diagnosis not present

## 2019-07-27 DIAGNOSIS — J189 Pneumonia, unspecified organism: Secondary | ICD-10-CM | POA: Diagnosis not present

## 2019-07-27 DIAGNOSIS — M549 Dorsalgia, unspecified: Secondary | ICD-10-CM | POA: Diagnosis not present

## 2019-07-27 DIAGNOSIS — M810 Age-related osteoporosis without current pathological fracture: Secondary | ICD-10-CM | POA: Diagnosis not present

## 2019-07-27 DIAGNOSIS — F0280 Dementia in other diseases classified elsewhere without behavioral disturbance: Secondary | ICD-10-CM | POA: Diagnosis not present

## 2019-07-27 DIAGNOSIS — G894 Chronic pain syndrome: Secondary | ICD-10-CM | POA: Diagnosis not present

## 2019-07-27 DIAGNOSIS — M4312 Spondylolisthesis, cervical region: Secondary | ICD-10-CM | POA: Diagnosis not present

## 2019-07-27 NOTE — Progress Notes (Signed)
Called patient to prechart for office visit.  Husband states patient has had 4 falls in the past 3 days.

## 2019-07-28 ENCOUNTER — Inpatient Hospital Stay: Payer: Medicare HMO | Attending: Oncology

## 2019-07-28 ENCOUNTER — Other Ambulatory Visit: Payer: Self-pay

## 2019-07-28 ENCOUNTER — Inpatient Hospital Stay (HOSPITAL_BASED_OUTPATIENT_CLINIC_OR_DEPARTMENT_OTHER): Payer: Medicare HMO | Admitting: Oncology

## 2019-07-28 ENCOUNTER — Inpatient Hospital Stay: Payer: Medicare HMO

## 2019-07-28 VITALS — BP 132/58 | HR 54 | Temp 97.3°F | Wt 139.0 lb

## 2019-07-28 DIAGNOSIS — Z79899 Other long term (current) drug therapy: Secondary | ICD-10-CM | POA: Diagnosis not present

## 2019-07-28 DIAGNOSIS — Z86711 Personal history of pulmonary embolism: Secondary | ICD-10-CM | POA: Diagnosis not present

## 2019-07-28 DIAGNOSIS — D72829 Elevated white blood cell count, unspecified: Secondary | ICD-10-CM | POA: Insufficient documentation

## 2019-07-28 DIAGNOSIS — D693 Immune thrombocytopenic purpura: Secondary | ICD-10-CM | POA: Insufficient documentation

## 2019-07-28 DIAGNOSIS — R5383 Other fatigue: Secondary | ICD-10-CM | POA: Insufficient documentation

## 2019-07-28 LAB — CBC WITH DIFFERENTIAL/PLATELET
Abs Immature Granulocytes: 0.05 10*3/uL (ref 0.00–0.07)
Basophils Absolute: 0.1 10*3/uL (ref 0.0–0.1)
Basophils Relative: 1 %
Eosinophils Absolute: 0 10*3/uL (ref 0.0–0.5)
Eosinophils Relative: 0 %
HCT: 34.4 % — ABNORMAL LOW (ref 36.0–46.0)
Hemoglobin: 11.2 g/dL — ABNORMAL LOW (ref 12.0–15.0)
Immature Granulocytes: 0 %
Lymphocytes Relative: 25 %
Lymphs Abs: 3.5 10*3/uL (ref 0.7–4.0)
MCH: 29.2 pg (ref 26.0–34.0)
MCHC: 32.6 g/dL (ref 30.0–36.0)
MCV: 89.6 fL (ref 80.0–100.0)
Monocytes Absolute: 1.1 10*3/uL — ABNORMAL HIGH (ref 0.1–1.0)
Monocytes Relative: 8 %
Neutro Abs: 9.2 10*3/uL — ABNORMAL HIGH (ref 1.7–7.7)
Neutrophils Relative %: 66 %
Platelets: 28 10*3/uL — CL (ref 150–400)
RBC: 3.84 MIL/uL — ABNORMAL LOW (ref 3.87–5.11)
RDW: 17 % — ABNORMAL HIGH (ref 11.5–15.5)
Smear Review: DECREASED
WBC: 14 10*3/uL — ABNORMAL HIGH (ref 4.0–10.5)
nRBC: 0 % (ref 0.0–0.2)

## 2019-07-28 MED ORDER — ROMIPLOSTIM INJECTION 500 MCG
615.0000 ug | Freq: Once | SUBCUTANEOUS | Status: AC
  Administered 2019-07-28: 12:00:00 615 ug via SUBCUTANEOUS
  Filled 2019-07-28: qty 1

## 2019-08-01 DIAGNOSIS — M549 Dorsalgia, unspecified: Secondary | ICD-10-CM | POA: Diagnosis not present

## 2019-08-01 DIAGNOSIS — F0280 Dementia in other diseases classified elsewhere without behavioral disturbance: Secondary | ICD-10-CM | POA: Diagnosis not present

## 2019-08-01 DIAGNOSIS — G309 Alzheimer's disease, unspecified: Secondary | ICD-10-CM | POA: Diagnosis not present

## 2019-08-01 DIAGNOSIS — G894 Chronic pain syndrome: Secondary | ICD-10-CM | POA: Diagnosis not present

## 2019-08-01 DIAGNOSIS — J189 Pneumonia, unspecified organism: Secondary | ICD-10-CM | POA: Diagnosis not present

## 2019-08-01 DIAGNOSIS — I10 Essential (primary) hypertension: Secondary | ICD-10-CM | POA: Diagnosis not present

## 2019-08-01 DIAGNOSIS — M4312 Spondylolisthesis, cervical region: Secondary | ICD-10-CM | POA: Diagnosis not present

## 2019-08-01 DIAGNOSIS — M1990 Unspecified osteoarthritis, unspecified site: Secondary | ICD-10-CM | POA: Diagnosis not present

## 2019-08-01 DIAGNOSIS — M810 Age-related osteoporosis without current pathological fracture: Secondary | ICD-10-CM | POA: Diagnosis not present

## 2019-08-04 DIAGNOSIS — G309 Alzheimer's disease, unspecified: Secondary | ICD-10-CM | POA: Diagnosis not present

## 2019-08-04 DIAGNOSIS — M1990 Unspecified osteoarthritis, unspecified site: Secondary | ICD-10-CM | POA: Diagnosis not present

## 2019-08-04 DIAGNOSIS — F0280 Dementia in other diseases classified elsewhere without behavioral disturbance: Secondary | ICD-10-CM | POA: Diagnosis not present

## 2019-08-04 DIAGNOSIS — I10 Essential (primary) hypertension: Secondary | ICD-10-CM | POA: Diagnosis not present

## 2019-08-04 DIAGNOSIS — M810 Age-related osteoporosis without current pathological fracture: Secondary | ICD-10-CM | POA: Diagnosis not present

## 2019-08-04 DIAGNOSIS — G894 Chronic pain syndrome: Secondary | ICD-10-CM | POA: Diagnosis not present

## 2019-08-04 DIAGNOSIS — M549 Dorsalgia, unspecified: Secondary | ICD-10-CM | POA: Diagnosis not present

## 2019-08-04 DIAGNOSIS — M4312 Spondylolisthesis, cervical region: Secondary | ICD-10-CM | POA: Diagnosis not present

## 2019-08-04 DIAGNOSIS — J189 Pneumonia, unspecified organism: Secondary | ICD-10-CM | POA: Diagnosis not present

## 2019-08-09 DIAGNOSIS — G309 Alzheimer's disease, unspecified: Secondary | ICD-10-CM | POA: Diagnosis not present

## 2019-08-09 DIAGNOSIS — G894 Chronic pain syndrome: Secondary | ICD-10-CM | POA: Diagnosis not present

## 2019-08-09 DIAGNOSIS — M810 Age-related osteoporosis without current pathological fracture: Secondary | ICD-10-CM | POA: Diagnosis not present

## 2019-08-09 DIAGNOSIS — I10 Essential (primary) hypertension: Secondary | ICD-10-CM | POA: Diagnosis not present

## 2019-08-09 DIAGNOSIS — M549 Dorsalgia, unspecified: Secondary | ICD-10-CM | POA: Diagnosis not present

## 2019-08-09 DIAGNOSIS — M1990 Unspecified osteoarthritis, unspecified site: Secondary | ICD-10-CM | POA: Diagnosis not present

## 2019-08-09 DIAGNOSIS — F0280 Dementia in other diseases classified elsewhere without behavioral disturbance: Secondary | ICD-10-CM | POA: Diagnosis not present

## 2019-08-09 DIAGNOSIS — M4312 Spondylolisthesis, cervical region: Secondary | ICD-10-CM | POA: Diagnosis not present

## 2019-08-09 DIAGNOSIS — J189 Pneumonia, unspecified organism: Secondary | ICD-10-CM | POA: Diagnosis not present

## 2019-08-11 DIAGNOSIS — M549 Dorsalgia, unspecified: Secondary | ICD-10-CM | POA: Diagnosis not present

## 2019-08-11 DIAGNOSIS — M1990 Unspecified osteoarthritis, unspecified site: Secondary | ICD-10-CM | POA: Diagnosis not present

## 2019-08-11 DIAGNOSIS — G309 Alzheimer's disease, unspecified: Secondary | ICD-10-CM | POA: Diagnosis not present

## 2019-08-11 DIAGNOSIS — M810 Age-related osteoporosis without current pathological fracture: Secondary | ICD-10-CM | POA: Diagnosis not present

## 2019-08-11 DIAGNOSIS — G894 Chronic pain syndrome: Secondary | ICD-10-CM | POA: Diagnosis not present

## 2019-08-11 DIAGNOSIS — M4312 Spondylolisthesis, cervical region: Secondary | ICD-10-CM | POA: Diagnosis not present

## 2019-08-11 DIAGNOSIS — J189 Pneumonia, unspecified organism: Secondary | ICD-10-CM | POA: Diagnosis not present

## 2019-08-11 DIAGNOSIS — I10 Essential (primary) hypertension: Secondary | ICD-10-CM | POA: Diagnosis not present

## 2019-08-11 DIAGNOSIS — F0280 Dementia in other diseases classified elsewhere without behavioral disturbance: Secondary | ICD-10-CM | POA: Diagnosis not present

## 2019-08-12 ENCOUNTER — Other Ambulatory Visit: Payer: Self-pay

## 2019-08-12 ENCOUNTER — Emergency Department
Admission: EM | Admit: 2019-08-12 | Discharge: 2019-08-13 | Disposition: A | Discharge status: Home / Self Care | Payer: Medicare HMO | Attending: Emergency Medicine | Admitting: Emergency Medicine

## 2019-08-12 ENCOUNTER — Encounter: Payer: Self-pay | Admitting: Emergency Medicine

## 2019-08-12 ENCOUNTER — Emergency Department: Payer: Medicare HMO

## 2019-08-12 DIAGNOSIS — G301 Alzheimer's disease with late onset: Secondary | ICD-10-CM | POA: Insufficient documentation

## 2019-08-12 DIAGNOSIS — Z79899 Other long term (current) drug therapy: Secondary | ICD-10-CM | POA: Insufficient documentation

## 2019-08-12 DIAGNOSIS — E039 Hypothyroidism, unspecified: Secondary | ICD-10-CM | POA: Insufficient documentation

## 2019-08-12 DIAGNOSIS — R0600 Dyspnea, unspecified: Secondary | ICD-10-CM | POA: Diagnosis not present

## 2019-08-12 DIAGNOSIS — R05 Cough: Secondary | ICD-10-CM | POA: Diagnosis not present

## 2019-08-12 DIAGNOSIS — R079 Chest pain, unspecified: Secondary | ICD-10-CM | POA: Diagnosis not present

## 2019-08-12 DIAGNOSIS — Z20828 Contact with and (suspected) exposure to other viral communicable diseases: Secondary | ICD-10-CM | POA: Diagnosis not present

## 2019-08-12 DIAGNOSIS — J209 Acute bronchitis, unspecified: Secondary | ICD-10-CM | POA: Insufficient documentation

## 2019-08-12 DIAGNOSIS — F0280 Dementia in other diseases classified elsewhere without behavioral disturbance: Secondary | ICD-10-CM | POA: Insufficient documentation

## 2019-08-12 LAB — CBC
HCT: 37.7 % (ref 36.0–46.0)
Hemoglobin: 11.5 g/dL — ABNORMAL LOW (ref 12.0–15.0)
MCH: 27.9 pg (ref 26.0–34.0)
MCHC: 30.5 g/dL (ref 30.0–36.0)
MCV: 91.5 fL (ref 80.0–100.0)
Platelets: 489 10*3/uL — ABNORMAL HIGH (ref 150–400)
RBC: 4.12 MIL/uL (ref 3.87–5.11)
RDW: 18.8 % — ABNORMAL HIGH (ref 11.5–15.5)
WBC: 15.1 10*3/uL — ABNORMAL HIGH (ref 4.0–10.5)
nRBC: 0 % (ref 0.0–0.2)

## 2019-08-12 LAB — COMPREHENSIVE METABOLIC PANEL
ALT: 17 U/L (ref 0–44)
AST: 29 U/L (ref 15–41)
Albumin: 4 g/dL (ref 3.5–5.0)
Alkaline Phosphatase: 100 U/L (ref 38–126)
Anion gap: 10 (ref 5–15)
BUN: 20 mg/dL (ref 8–23)
CO2: 24 mmol/L (ref 22–32)
Calcium: 9.6 mg/dL (ref 8.9–10.3)
Chloride: 106 mmol/L (ref 98–111)
Creatinine, Ser: 0.98 mg/dL (ref 0.44–1.00)
GFR calc Af Amer: >60 mL/min (ref >60)
GFR calc non Af Amer: 53 mL/min — ABNORMAL LOW (ref >60)
Glucose, Bld: 146 mg/dL — ABNORMAL HIGH (ref 70–99)
Potassium: 5.1 mmol/L (ref 3.5–5.1)
Sodium: 140 mmol/L (ref 135–145)
Total Bilirubin: 0.4 mg/dL (ref 0.3–1.2)
Total Protein: 6.9 g/dL (ref 6.5–8.1)

## 2019-08-12 NOTE — ED Triage Notes (Signed)
Cough times week and half. Denies fevers. Speaking full sentences with resp appear unlabored.

## 2019-08-12 NOTE — ED Notes (Signed)
Pt reports feels like it is hard to breathe and when she coughs it feels like she has phlem stuck down low and she can't get it to come up. Pt reports painful when she coughs but she denies fevers.

## 2019-08-13 ENCOUNTER — Encounter: Payer: Self-pay | Admitting: Radiology

## 2019-08-13 ENCOUNTER — Emergency Department: Payer: Medicare HMO

## 2019-08-13 DIAGNOSIS — R079 Chest pain, unspecified: Secondary | ICD-10-CM | POA: Diagnosis not present

## 2019-08-13 LAB — SARS CORONAVIRUS 2 (TAT 6-24 HRS): SARS Coronavirus 2: NEGATIVE

## 2019-08-13 LAB — TROPONIN I (HIGH SENSITIVITY): Troponin I (High Sensitivity): 8 ng/L (ref <18)

## 2019-08-13 MED ORDER — BENZONATATE 100 MG PO CAPS
100.0000 mg | ORAL_CAPSULE | Freq: Once | ORAL | Status: AC
  Administered 2019-08-13: 100 mg via ORAL
  Filled 2019-08-13: qty 1

## 2019-08-13 MED ORDER — IOHEXOL 350 MG/ML SOLN
75.0000 mL | Freq: Once | INTRAVENOUS | Status: AC | PRN
  Administered 2019-08-13: 75 mL via INTRAVENOUS
  Filled 2019-08-13: qty 75

## 2019-08-13 MED ORDER — AZITHROMYCIN 500 MG PO TABS: 500.0000 mg | ORAL_TABLET | Freq: Every day | ORAL | 0 refills | Status: AC

## 2019-08-13 MED ORDER — IPRATROPIUM-ALBUTEROL 0.5-2.5 (3) MG/3ML IN SOLN
3.0000 mL | Freq: Once | RESPIRATORY_TRACT | Status: AC
  Administered 2019-08-13: 3 mL via RESPIRATORY_TRACT
  Filled 2019-08-13: qty 3

## 2019-08-13 MED ORDER — BENZONATATE 100 MG PO CAPS: 100.0000 mg | ORAL_CAPSULE | Freq: Three times a day (TID) | ORAL | 0 refills | Status: DC | PRN

## 2019-08-13 MED ORDER — METHYLPREDNISOLONE SODIUM SUCC 125 MG IJ SOLR
125.0000 mg | Freq: Once | INTRAMUSCULAR | Status: AC
  Administered 2019-08-13: 125 mg via INTRAVENOUS
  Filled 2019-08-13: qty 2

## 2019-08-13 NOTE — ED Provider Notes (Signed)
[facility] Emergency Department Provider Note   First MD Initiated Contact with Patient 08/13/19 0002     (approximate)  I have reviewed the triage vital signs and the nursing notes.   HISTORY  Chief Complaint Cough    HPI Ariana [name] is a 85 y.o. female with below list of previous medical conditions including pulmonary emboli presents to the emergency department with 1-1/2-week history of nonproductive cough and dyspnea.  Patient denies any chest pain.  Patient denies any nausea or vomiting.  Patient denies any dizziness or syncope.  Patient states that they were evaluated by Dr. Raul Del pulmonologist who prescribed the patient a prednisone taper, Tessalon Perles, and an antibiotic which patient has completed without any improvement in cough.       Past Medical History:  Diagnosis Date  . Alzheimer disease ([place])   . Anemia   . Anxiety   . Asthma   . Chronic back pain   . Depression   . Depression   . GERD (gastroesophageal reflux disease)   . Hiatal hernia   . Hypercholesteremia   . ITP (idiopathic thrombocytopenic purpura)   . Osteoarthritis   . Osteoporosis   . Pulmonary emboli ([place])   . Restless legs     Patient Active Problem List   Diagnosis Date Noted  . Pneumonia 07/10/2019  . Laceration of left lower leg 04/10/2019  . Chronic pain syndrome 01/11/2019  . OSA (obstructive sleep apnea) 09/28/2018  . Ulcer of lower limb, right, limited to breakdown of skin ([place]) 07/22/2018  . Traumatic hematoma of right lower leg 07/12/2018  . Asthma without status asthmaticus 11/25/2016  . Late onset Alzheimer's disease without behavioral disturbance ([place]) 12/30/2015  . Back muscle spasm 09/09/2015  . Clinical depression 04/12/2015  . Herpes zona 04/12/2015  . Chronic ITP (idiopathic thrombocytopenic purpura) ([facility]) 03/15/2015  . Chest pain 10/22/2014  . Breathlessness on exertion 10/22/2014  . Osteopenia 10/18/2014  . Benign  essential HTN 06/28/2014  . HLD (hyperlipidemia) 06/28/2014  . Idiopathic thrombocythemia ([place]) 06/28/2014  . Acquired hypothyroidism 06/04/2014  . Anxiety and depression 06/04/2014  . Bursitis, trochanteric 03/27/2014  . DDD (degenerative disc disease), lumbar 02/27/2014  . Neuritis or radiculitis due to rupture of lumbar intervertebral disc 02/27/2014  . Lumbar radiculitis 02/27/2014    Past Surgical History:  Procedure Laterality Date  . CARPAL TUNNEL RELEASE    . ESOPHAGOGASTRODUODENOSCOPY (EGD) WITH PROPOFOL N/A 09/10/2015   Procedure: ESOPHAGOGASTRODUODENOSCOPY (EGD) WITH PROPOFOL;  Surgeon: Lollie Sails, MD;  Location: [facility] ENDOSCOPY;  Service: Endoscopy;  Laterality: N/A;  . IRRIGATION AND DEBRIDEMENT HEMATOMA Right 07/13/2018   Procedure: IRRIGATION AND DEBRIDEMENT HEMATOMA-RIGHT SHIN;  Surgeon: Herbert Pun, MD;  Location: [facility] ORS;  Service: General;  Laterality: Right;  . KYPHOPLASTY N/A 05/02/2019   Procedure: L1 KYPHOPLASTY;  Surgeon: Hessie Knows, MD;  Location: [facility] ORS;  Service: Orthopedics;  Laterality: N/A;  . LUMBAR [place]    . SPLENECTOMY, PARTIAL    . TOTAL ABDOMINAL HYSTERECTOMY      Prior to Admission medications   Medication Sig Start Date End Date Taking? Authorizing Provider  acetaminophen (TYLENOL) 325 MG tablet Take 2 tablets (650 mg total) by mouth every 6 (six) hours as needed for mild pain (or Fever >/= 101). 07/02/15   Idelle Crouch, MD  albuterol (PROVENTIL HFA;VENTOLIN HFA) 108 (90 BASE) MCG/ACT inhaler Inhale 2 puffs into the lungs every 6 (six) hours as needed for wheezing or shortness of breath.  [provider]  ALPRAZolam Duanne Moron) 0.5 MG tablet Take 1 tablet (0.5 mg total) by mouth 3 (three) times daily as needed for anxiety. 07/15/18   Gladstone Lighter, MD  azithromycin (ZITHROMAX Z-PAK) 250 MG tablet Take 1 tablet (250 mg total) by mouth daily. 07/12/19   Mayo, Pete Pelt, MD  buPROPion (WELLBUTRIN XL) 150 MG 24  hr tablet Take 150 mg by mouth daily.    [provider]  busPIRone (BUSPAR) 15 MG tablet Take 15 mg by mouth 2 (two) times daily.  08/15/18   [provider]  Calcium Carbonate-Vit D-Min (CALCIUM 600+D PLUS MINERALS) 600-400 MG-UNIT TABS Take 1 tablet by mouth daily.     [provider]  Carboxymethylcellul-Glycerin (LUBRICATING EYE DROPS OP) Place 1 drop into both eyes daily as needed (dry eyes).    [provider]  cefdinir (OMNICEF) 300 MG capsule Take 1 capsule (300 mg total) by mouth 2 (two) times daily. 07/12/19   Mayo, Pete Pelt, MD  cholecalciferol (VITAMIN D3) 25 MCG (1000 UT) tablet Take 1,000 Units by mouth 2 (two) times a day.    [provider]  citalopram (CELEXA) 40 MG tablet Take 40 mg by mouth daily.    [provider]  Cyanocobalamin (RA VITAMIN B-12 TR) 1000 MCG TBCR Take 1,000 mcg by mouth daily.     [provider]  cyclobenzaprine (FLEXERIL) 5 MG tablet Take 5 mg by mouth 3 (three) times daily as needed for muscle spasms. 04/26/19   [provider]  donepezil (ARICEPT) 10 MG tablet Take 10 mg by mouth at bedtime.  04/03/19 09/30/19  [provider]  fluticasone (FLONASE) 50 MCG/ACT nasal spray Place 2 sprays into both nostrils daily as needed for allergies.  03/03/17   [provider]  Fluticasone-Salmeterol (ADVAIR) 250-50 MCG/DOSE AEPB Inhale 1 puff into the lungs daily as needed (shortness of breath).     [provider]  gabapentin (NEURONTIN) 300 MG capsule Take 300 mg by mouth 4 (four) times daily.    [provider]  HYDROcodone-acetaminophen (NORCO) 5-325 MG tablet Take 1 tablet by mouth every 6 (six) hours as needed for moderate pain. 05/02/19   Hessie Knows, MD  levothyroxine (SYNTHROID, LEVOTHROID) 50 MCG tablet Take 50 mcg by mouth daily before breakfast. Take 30 to 60 minutes before breakfast.    [provider]  memantine (NAMENDA) 10 MG tablet Take 10 mg  by mouth daily.  04/03/19 07/10/19  [provider]  montelukast (SINGULAIR) 10 MG tablet Take 10 mg by mouth at bedtime.     [provider]  Multiple Vitamin (MULTI-VITAMIN) tablet Take 1 tablet by mouth daily.     [provider]  Multiple Vitamins-Minerals (PRESERVISION AREDS 2) CAPS Take 1 capsule by mouth 2 (two) times a day.    [provider]  NARCAN 4 MG/0.1ML LIQD nasal spray kit Place 1 spray into the nose once.  10/24/18   [provider]  nystatin (MYCOSTATIN) 100000 UNIT/ML suspension Take 5 mLs (500,000 Units total) by mouth 4 (four) times daily. 07/12/19   Mayo, Pete Pelt, MD  oxybutynin (DITROPAN) 5 MG tablet Take 5 mg by mouth 3 (three) times daily.    [provider]  pantoprazole (PROTONIX) 20 MG tablet Take 20 mg by mouth 2 (two) times daily.  07/24/15   [provider]  pravastatin (PRAVACHOL) 20 MG tablet Take 20 mg by mouth daily.    [provider]  ranitidine (ZANTAC) 300 MG  tablet Take 300 mg by mouth at bedtime.  11/18/18 11/18/19  [provider]  traMADol (ULTRAM) 50 MG tablet Take 50 mg by mouth 2 (two) times daily.  07/23/18   [provider]  vitamin C (ASCORBIC ACID) 500 MG tablet Take 500 mg by mouth daily.    [provider]  vitamin E (E-400) 400 UNIT capsule Take 400 Units by mouth daily.    [provider]    Allergies Aspirin, Diazepam, Other, Tetanus toxoids, and Morphine  Family History  Problem Relation Age of Onset  . Stroke Mother     Social History Social History   Tobacco Use  . Smoking status: Never Smoker  . Smokeless tobacco: Never Used  Substance Use Topics  . Alcohol use: No    Alcohol/week: 0.0 standard drinks  . Drug use: No    Review of Systems Constitutional: No fever/chills Eyes: No visual changes. ENT: No sore throat. Cardiovascular: Denies chest pain. Respiratory: Positive for cough and dyspnea Gastrointestinal: No  abdominal pain.  No nausea, no vomiting.  No diarrhea.  No constipation. Genitourinary: Negative for dysuria. Musculoskeletal: Negative for neck pain.  Negative for back pain. Integumentary: Negative for rash. Neurological: Negative for headaches, focal weakness or numbness.  ____________________________________________   PHYSICAL EXAM:  VITAL SIGNS: ED Triage Vitals [08/12/19 1851]  Enc Vitals Group     BP (!) 142/58     Pulse Rate 74     Resp 20     Temp 97.9 F (36.6 C)     Temp Source Oral     SpO2 98 %     Weight 62.6 kg (138 lb)     Height 1.676 m ('5\' 6"'$ )     Head Circumference      Peak Flow      Pain Score 0     Pain Loc      Pain Edu?      Excl. in [place]?     Constitutional: Alert and oriented.  Coughing Eyes: Conjunctivae are normal.  Mouth/Throat: Patient is wearing a mask. Neck: No stridor.  No meningeal signs.   Cardiovascular: Normal rate, regular rhythm. Good peripheral circulation. Grossly normal heart sounds. Respiratory: Normal respiratory effort.  No retractions. Gastrointestinal: Soft and nontender. No distention.  Musculoskeletal: No lower extremity tenderness nor edema. No gross deformities of extremities. Neurologic:  Normal speech and language. No gross focal neurologic deficits are appreciated.  Skin:  Skin is warm, dry and intact. Psychiatric: Mood and affect are normal. Speech and behavior are normal.  ____________________________________________   LABS (all labs ordered are listed, but only abnormal results are displayed)  Labs Reviewed  CBC - Abnormal; Notable for the following components:      Result Value   WBC 15.1 (*)    Hemoglobin 11.5 (*)    RDW 18.8 (*)    Platelets 489 (*)    All other components within normal limits  COMPREHENSIVE METABOLIC PANEL - Abnormal; Notable for the following components:   Glucose, Bld 146 (*)    GFR calc non Af Amer 53 (*)    All other components within normal limits  TROPONIN I (HIGH SENSITIVITY)    ____________________________________________  EKG ED ECG REPORT I, [place] Laniesha Das, the attending physician, personally viewed and interpreted this ECG.   Date: 08/12/2019  EKG Time: 8:55 PM  Rate: 76  Rhythm: Normal sinus rhythm with left bundle branch block  Axis: Left axis deviation  Interval: Normal  ST&T Change: None  ____________________________________________  RADIOLOGY I, Gregor Hams, personally viewed and evaluated these images (plain radiographs) as part of my medical decision making, as well as reviewing the written report by the radiologist.  ED MD interpretation: No active disease noted on chest x-ray per radiologist  Official radiology report(s): Dg Chest Portable 1 View  Result Date: 08/12/2019 CLINICAL DATA:  Cough. EXAM: PORTABLE CHEST 1 VIEW COMPARISON:  July 10, 2019 FINDINGS: The heart size and mediastinal contours are within normal limits. Both lungs are clear. The visualized skeletal structures are unremarkable. IMPRESSION: No active disease. Electronically Signed   By: Dorise Bullion III M.D   On: 08/12/2019 19:39     Procedures   ____________________________________________   INITIAL IMPRESSION / MDM / [place] / ED COURSE  As part of my medical decision making, I reviewed the following data within the [place] NUMBER  85 year old female presented with above-stated history and physical exam concerning for possible bronchitis pneumonia and less likely pulmonary emboli however given patient's previous history of PE CT scan performed.  No evidence of pneumonia or pulmonary emboli noted.  Patient will be treated for bronchitis.  Patient given azithromycin and Tessalon Perles.  Patient recently finished a course of steroid and as such we will not repeat steroids at this time.  Patient advised to follow-up with Dr. Raul Del and Dr. Doy Hutching  ____________________________________________  FINAL CLINICAL IMPRESSION(S) / ED  DIAGNOSES  Final diagnoses:  Acute bronchitis, unspecified organism     MEDICATIONS GIVEN DURING THIS VISIT:  Medications  ipratropium-albuterol (DUONEB) 0.5-2.5 (3) MG/3ML nebulizer solution 3 mL (has no administration in time range)  methylPREDNISolone sodium succinate (SOLU-MEDROL) 125 mg/2 mL injection 125 mg (has no administration in time range)  benzonatate (TESSALON) capsule 100 mg (has no administration in time range)     ED Discharge Orders    None      *Please note:  Simra Fiebig was evaluated in Emergency Department on 08/13/2019 for the symptoms described in the history of present illness. She was evaluated in the context of the global COVID-19 pandemic, which necessitated consideration that the patient might be at risk for infection with the SARS-CoV-2 virus that causes COVID-19. Institutional protocols and algorithms that pertain to the evaluation of patients at risk for COVID-19 are in a state of rapid change based on information released by regulatory bodies including the CDC and federal and state organizations. These policies and algorithms were followed during the patient's care in the ED.  Some ED evaluations and interventions may be delayed as a result of limited staffing during the pandemic.*  Note:  This document was prepared using Dragon voice recognition software and may include unintentional dictation errors.   Gregor Hams, MD 08/14/19 [phone]

## 2019-08-13 NOTE — ED Notes (Signed)
Patient transported to CT 

## 2019-08-15 DIAGNOSIS — J4521 Mild intermittent asthma with (acute) exacerbation: Secondary | ICD-10-CM | POA: Diagnosis not present

## 2019-08-15 DIAGNOSIS — R05 Cough: Secondary | ICD-10-CM | POA: Diagnosis not present

## 2019-08-15 DIAGNOSIS — R0600 Dyspnea, unspecified: Secondary | ICD-10-CM | POA: Diagnosis not present

## 2019-08-16 DIAGNOSIS — G309 Alzheimer's disease, unspecified: Secondary | ICD-10-CM | POA: Diagnosis not present

## 2019-08-16 DIAGNOSIS — F0280 Dementia in other diseases classified elsewhere without behavioral disturbance: Secondary | ICD-10-CM | POA: Diagnosis not present

## 2019-08-16 DIAGNOSIS — G894 Chronic pain syndrome: Secondary | ICD-10-CM | POA: Diagnosis not present

## 2019-08-16 DIAGNOSIS — M4312 Spondylolisthesis, cervical region: Secondary | ICD-10-CM | POA: Diagnosis not present

## 2019-08-16 DIAGNOSIS — J189 Pneumonia, unspecified organism: Secondary | ICD-10-CM | POA: Diagnosis not present

## 2019-08-16 DIAGNOSIS — M810 Age-related osteoporosis without current pathological fracture: Secondary | ICD-10-CM | POA: Diagnosis not present

## 2019-08-16 DIAGNOSIS — M1990 Unspecified osteoarthritis, unspecified site: Secondary | ICD-10-CM | POA: Diagnosis not present

## 2019-08-16 DIAGNOSIS — I10 Essential (primary) hypertension: Secondary | ICD-10-CM | POA: Diagnosis not present

## 2019-08-16 DIAGNOSIS — M549 Dorsalgia, unspecified: Secondary | ICD-10-CM | POA: Diagnosis not present

## 2019-08-17 DIAGNOSIS — G894 Chronic pain syndrome: Secondary | ICD-10-CM | POA: Diagnosis not present

## 2019-08-18 NOTE — Progress Notes (Signed)
[place]  [phone]  [phone]     Ariana [name] DOB: 21-Oct-1932  MR#: [id number]  OBS#:[id number]  Patient Care Team: Idelle Crouch, MD as PCP - General (Internal Medicine)  CHIEF COMPLAINT: Chronic refractory ITP  INTERVAL HISTORY: Patient returns to clinic today for repeat laboratory work, further evaluation, and consideration of additional Nplate.  She was in the emergency room 2 weeks ago for chest pain, but CT scan was negative and no etiology could be determined.  Her symptoms have now resolved.  She continues to have chronic weakness and fatigue, but denies any further falls or injuries. She continues to have increased confusion and is a poor historian.  She denies any easy bleeding or bruising.  She has no neurologic complaints.  She denies any recent fevers or illnesses. She denies any chest pain, shortness of breath, cough, or hemoptysis.  She denies any nausea, vomiting, constipation, or diarrhea.  She has no melena or hematochezia.  She has no urinary complaints.  Patient offers no further specific complaints today.  REVIEW OF SYSTEMS:   Review of Systems  Constitutional: Negative.  Negative for fever, malaise/fatigue and weight loss.  HENT: Negative for congestion.   Respiratory: Negative.  Negative for cough, hemoptysis and shortness of breath.   Cardiovascular: Negative.  Negative for chest pain and leg swelling.  Gastrointestinal: Negative.  Negative for abdominal pain, blood in stool, diarrhea, melena and nausea.  Genitourinary: Negative.  Negative for dysuria and hematuria.  Musculoskeletal: Negative.  Negative for back pain and falls.  Skin: Negative.  Negative for rash.  Neurological: Negative.  Negative for sensory change, focal weakness and weakness.  Endo/Heme/Allergies: Does not bruise/bleed easily.  Psychiatric/Behavioral: Positive for memory loss. Negative for depression. The patient is not nervous/anxious.      As per HPI. Otherwise, a complete review of systems is negative.  PAST MEDICAL HISTORY: Past Medical History:  Diagnosis Date  . Alzheimer disease ([place])   . Anemia   . Anxiety   . Asthma   . Chronic back pain   . Depression   . Depression   . GERD (gastroesophageal reflux disease)   . Hiatal hernia   . Hypercholesteremia   . ITP (idiopathic thrombocytopenic purpura)   . Osteoarthritis   . Osteoporosis   . Pulmonary emboli ([place])   . Restless legs     PAST SURGICAL HISTORY: Past Surgical History:  Procedure Laterality Date  . CARPAL TUNNEL RELEASE    . ESOPHAGOGASTRODUODENOSCOPY (EGD) WITH PROPOFOL N/A 09/10/2015   Procedure: ESOPHAGOGASTRODUODENOSCOPY (EGD) WITH PROPOFOL;  Surgeon: Lollie Sails, MD;  Location: [facility] ENDOSCOPY;  Service: Endoscopy;  Laterality: N/A;  . IRRIGATION AND DEBRIDEMENT HEMATOMA Right 07/13/2018   Procedure: IRRIGATION AND DEBRIDEMENT HEMATOMA-RIGHT SHIN;  Surgeon: Herbert Pun, MD;  Location: [facility] ORS;  Service: General;  Laterality: Right;  . KYPHOPLASTY N/A 05/02/2019   Procedure: L1 KYPHOPLASTY;  Surgeon: Hessie Knows, MD;  Location: [facility] ORS;  Service: Orthopedics;  Laterality: N/A;  . LUMBAR [place]    . SPLENECTOMY, PARTIAL    . TOTAL ABDOMINAL HYSTERECTOMY      FAMILY HISTORY Family History  Problem Relation Age of Onset  . Stroke Mother     GYNECOLOGIC HISTORY:  No LMP recorded. Patient is postmenopausal.     ADVANCED DIRECTIVES:    HEALTH MAINTENANCE: Social History   Tobacco Use  . Smoking status: Never Smoker  . Smokeless tobacco: Never Used  Substance Use Topics  . Alcohol use:  No    Alcohol/week: 0.0 standard drinks  . Drug use: No     Allergies  Allergen Reactions  . Aspirin     Upset stomach  . Diazepam Itching    sneezing  . Other     seasonal allergies  . Tetanus Toxoids     Localized superficial swelling of skin  . Morphine Itching and Rash    Current Outpatient Medications   Medication Sig Dispense Refill  . acetaminophen (TYLENOL) 325 MG tablet Take 2 tablets (650 mg total) by mouth every 6 (six) hours as needed for mild pain (or Fever >/= 101). 100 tablet 0  . albuterol (PROVENTIL HFA;VENTOLIN HFA) 108 (90 BASE) MCG/ACT inhaler Inhale 2 puffs into the lungs every 6 (six) hours as needed for wheezing or shortness of breath.    . ALPRAZolam (XANAX) 0.5 MG tablet Take 1 tablet (0.5 mg total) by mouth 3 (three) times daily as needed for anxiety. 20 tablet 0  . benzonatate (TESSALON PERLES) 100 MG capsule Take 1 capsule (100 mg total) by mouth 3 (three) times daily as needed. 30 capsule 0  . buPROPion (WELLBUTRIN XL) 150 MG 24 hr tablet Take 150 mg by mouth daily.    . busPIRone (BUSPAR) 15 MG tablet Take 15 mg by mouth 2 (two) times daily.     . Calcium Carbonate-Vit D-Min (CALCIUM 600+D PLUS MINERALS) 600-400 MG-UNIT TABS Take 1 tablet by mouth daily.     . Carboxymethylcellul-Glycerin (LUBRICATING EYE DROPS OP) Place 1 drop into both eyes daily as needed (dry eyes).    . cholecalciferol (VITAMIN D3) 25 MCG (1000 UT) tablet Take 1,000 Units by mouth 2 (two) times a day.    . Cyanocobalamin (RA VITAMIN B-12 TR) 1000 MCG TBCR Take 1,000 mcg by mouth daily.     . cyclobenzaprine (FLEXERIL) 5 MG tablet Take 5 mg by mouth 3 (three) times daily as needed for muscle spasms.    Marland Kitchen donepezil (ARICEPT) 10 MG tablet Take 10 mg by mouth at bedtime.     . fluticasone (FLONASE) 50 MCG/ACT nasal spray Place 2 sprays into both nostrils daily as needed for allergies.     . Fluticasone-Salmeterol (ADVAIR) 250-50 MCG/DOSE AEPB Inhale 1 puff into the lungs daily as needed (shortness of breath).     . gabapentin (NEURONTIN) 300 MG capsule Take 300 mg by mouth 4 (four) times daily.    Marland Kitchen HYDROcodone-acetaminophen (NORCO) 5-325 MG tablet Take 1 tablet by mouth every 6 (six) hours as needed for moderate pain. 30 tablet 0  . levothyroxine (SYNTHROID, LEVOTHROID) 50 MCG tablet Take 50 mcg by  mouth daily before breakfast. Take 30 to 60 minutes before breakfast.    . montelukast (SINGULAIR) 10 MG tablet Take 10 mg by mouth at bedtime.     . Multiple Vitamin (MULTI-VITAMIN) tablet Take 1 tablet by mouth daily.     . Multiple Vitamins-Minerals (PRESERVISION AREDS 2) CAPS Take 1 capsule by mouth 2 (two) times a day.    Marland Kitchen NARCAN 4 MG/0.1ML LIQD nasal spray kit Place 1 spray into the nose once.     . nystatin (MYCOSTATIN) 100000 UNIT/ML suspension Take 5 mLs (500,000 Units total) by mouth 4 (four) times daily. 140 mL 0  . oxybutynin (DITROPAN) 5 MG tablet Take 5 mg by mouth 3 (three) times daily.    . pantoprazole (PROTONIX) 20 MG tablet Take 20 mg by mouth 2 (two) times daily.     . pravastatin (PRAVACHOL) 20 MG tablet Take  20 mg by mouth daily.    . ranitidine (ZANTAC) 300 MG tablet Take 300 mg by mouth at bedtime.     . vitamin C (ASCORBIC ACID) 500 MG tablet Take 500 mg by mouth daily.    . vitamin E (E-400) 400 UNIT capsule Take 400 Units by mouth daily.    Marland Kitchen azithromycin (ZITHROMAX Z-PAK) 250 MG tablet Take 1 tablet (250 mg total) by mouth daily. (Patient not taking: Reported on 08/24/2019) 3 each 0  . cefdinir (OMNICEF) 300 MG capsule Take 1 capsule (300 mg total) by mouth 2 (two) times daily. (Patient not taking: Reported on 08/24/2019) 5 capsule 0  . citalopram (CELEXA) 40 MG tablet Take 40 mg by mouth daily.    . memantine (NAMENDA) 10 MG tablet Take 10 mg by mouth daily.     . traMADol (ULTRAM) 50 MG tablet Take 50 mg by mouth 2 (two) times daily.   5   Current Facility-Administered Medications  Medication Dose Route Frequency Provider Last Rate Last Dose  . romiPLOStim (NPLATE) injection 615 mcg  10 mcg/kg Subcutaneous Once Lloyd Huger, MD        OBJECTIVE: BP (!) 152/60 (BP Location: Left Arm, Patient Position: Sitting)   Pulse (!) 50   Temp (!) 97.3 F (36.3 C) (Tympanic)   Wt 135 lb 8 oz (61.5 kg)   SpO2 97%   BMI 21.87 kg/m    Body mass index is 21.87  kg/m.    ECOG FS:1 - Symptomatic but completely ambulatory  General: Well-developed, well-nourished, no acute distress. Eyes: Pink conjunctiva, anicteric sclera. HEENT: Normocephalic, moist mucous membranes. Lungs: Clear to auscultation bilaterally. Heart: Regular rate and rhythm. No rubs, murmurs, or gallops. Abdomen: Soft, nontender, nondistended. No organomegaly noted, normoactive bowel sounds. Musculoskeletal: No edema, cyanosis, or clubbing. Neuro: Alert, answering all questions appropriately. Cranial nerves grossly intact. Skin: No rashes or petechiae noted. Psych: Normal affect.  LAB RESULTS:  Appointment on 08/25/2019  Component Date Value Ref Range Status  . WBC 08/25/2019 11.2* 4.0 - 10.5 K/uL Final  . RBC 08/25/2019 3.76* 3.87 - 5.11 MIL/uL Final  . Hemoglobin 08/25/2019 10.9* 12.0 - 15.0 g/dL Final  . HCT 08/25/2019 34.3* 36.0 - 46.0 % Final  . MCV 08/25/2019 91.2  80.0 - 100.0 fL Final  . MCH 08/25/2019 29.0  26.0 - 34.0 pg Final  . MCHC 08/25/2019 31.8  30.0 - 36.0 g/dL Final  . RDW 08/25/2019 19.0* 11.5 - 15.5 % Final  . Platelets 08/25/2019 PENDING  150 - 400 K/uL Incomplete  . nRBC 08/25/2019 0.2  0.0 - 0.2 % Final   Performed at [facility], [address] [place], [place] [address]  . Neutrophils Relative % 08/25/2019 PENDING  % Incomplete  . Neutro Abs 08/25/2019 PENDING  1.7 - 7.7 K/uL Incomplete  . Band Neutrophils 08/25/2019 PENDING  % Incomplete  . Lymphocytes Relative 08/25/2019 PENDING  % Incomplete  . Lymphs Abs 08/25/2019 PENDING  0.7 - 4.0 K/uL Incomplete  . Monocytes Relative 08/25/2019 PENDING  % Incomplete  . Monocytes Absolute 08/25/2019 PENDING  0.1 - 1.0 K/uL Incomplete  . Eosinophils Relative 08/25/2019 PENDING  % Incomplete  . Eosinophils Absolute 08/25/2019 PENDING  0.0 - 0.5 K/uL Incomplete  . Basophils Relative 08/25/2019 PENDING  % Incomplete  . Basophils Absolute 08/25/2019 PENDING  0.0 - 0.1 K/uL Incomplete  . WBC Morphology  08/25/2019 PENDING   Incomplete  . RBC Morphology 08/25/2019 PENDING   Incomplete  . Smear Review  08/25/2019 PENDING   Incomplete  . Other 08/25/2019 PENDING  % Incomplete  . nRBC 08/25/2019 PENDING  0 /100 WBC Incomplete  . Metamyelocytes Relative 08/25/2019 PENDING  % Incomplete  . Myelocytes 08/25/2019 PENDING  % Incomplete  . Promyelocytes Relative 08/25/2019 PENDING  % Incomplete  . Blasts 08/25/2019 PENDING  % Incomplete  . Immature Granulocytes 08/25/2019 PENDING  % Incomplete  . Abs Immature Granulocytes 08/25/2019 PENDING  0.00 - 0.07 K/uL Incomplete    STUDIES: No results found.  ASSESSMENT:  Chronic refractory ITP  PLAN:   1. Chronic refractory ITP: Patient had a poor response to Prednisone, WinRho, and Rituxan. She is status post splenectomy in 2007.  She received IVIG and Nplate she received while at [facility] with significant improvement of her platelets.  [facility] has recommended weekly Nplate, but given this patient's comorbidities, weekly clinic visits would be difficult.  IVIG also works, but because of the visitor restrictions during COVID-19 patient likely would not do well sitting in the clinic alone all day for multiple days at a time.  Patient is currently receiving the max dose of Nplate at 10 mcg/kg.  Can also consider Promacta 12.108m or Tavalisse 100 mg BID if needed.  Despite patient's platelet count being greater than 400 two weeks ago, it has now dropped and is currently 16.  Proceed with Nplate as scheduled.  She continues to only require treatment every 4 weeks.  Return to clinic in 4 weeks for further evaluation and continuation of treatment. 2. Weakness and fatigue: Chronic and unchanged. 3. Depression/dementia: Patient is now at [employer] [facility].  Anemia: Chronic and unchanged.  Hemoglobin 10.9. 5.  Leukocytosis: Chronic and unchanged.  Monitor.  Patient expressed understanding and was in agreement with this plan. She also understands that She can call clinic at any  time with any questions, concerns, or complaints.    TLloyd Huger MD   08/25/2019 11:04 AM

## 2019-08-21 DIAGNOSIS — J189 Pneumonia, unspecified organism: Secondary | ICD-10-CM | POA: Diagnosis not present

## 2019-08-21 DIAGNOSIS — M4312 Spondylolisthesis, cervical region: Secondary | ICD-10-CM | POA: Diagnosis not present

## 2019-08-21 DIAGNOSIS — F0280 Dementia in other diseases classified elsewhere without behavioral disturbance: Secondary | ICD-10-CM | POA: Diagnosis not present

## 2019-08-21 DIAGNOSIS — M549 Dorsalgia, unspecified: Secondary | ICD-10-CM | POA: Diagnosis not present

## 2019-08-21 DIAGNOSIS — G894 Chronic pain syndrome: Secondary | ICD-10-CM | POA: Diagnosis not present

## 2019-08-21 DIAGNOSIS — M810 Age-related osteoporosis without current pathological fracture: Secondary | ICD-10-CM | POA: Diagnosis not present

## 2019-08-21 DIAGNOSIS — I10 Essential (primary) hypertension: Secondary | ICD-10-CM | POA: Diagnosis not present

## 2019-08-21 DIAGNOSIS — M1990 Unspecified osteoarthritis, unspecified site: Secondary | ICD-10-CM | POA: Diagnosis not present

## 2019-08-21 DIAGNOSIS — G309 Alzheimer's disease, unspecified: Secondary | ICD-10-CM | POA: Diagnosis not present

## 2019-08-24 ENCOUNTER — Other Ambulatory Visit: Payer: Self-pay

## 2019-08-24 ENCOUNTER — Encounter: Payer: Self-pay | Admitting: Oncology

## 2019-08-24 DIAGNOSIS — I10 Essential (primary) hypertension: Secondary | ICD-10-CM | POA: Diagnosis not present

## 2019-08-24 DIAGNOSIS — M810 Age-related osteoporosis without current pathological fracture: Secondary | ICD-10-CM | POA: Diagnosis not present

## 2019-08-24 DIAGNOSIS — M1990 Unspecified osteoarthritis, unspecified site: Secondary | ICD-10-CM | POA: Diagnosis not present

## 2019-08-24 DIAGNOSIS — G309 Alzheimer's disease, unspecified: Secondary | ICD-10-CM | POA: Diagnosis not present

## 2019-08-24 DIAGNOSIS — G894 Chronic pain syndrome: Secondary | ICD-10-CM | POA: Diagnosis not present

## 2019-08-24 DIAGNOSIS — M549 Dorsalgia, unspecified: Secondary | ICD-10-CM | POA: Diagnosis not present

## 2019-08-24 DIAGNOSIS — F0280 Dementia in other diseases classified elsewhere without behavioral disturbance: Secondary | ICD-10-CM | POA: Diagnosis not present

## 2019-08-24 DIAGNOSIS — J189 Pneumonia, unspecified organism: Secondary | ICD-10-CM | POA: Diagnosis not present

## 2019-08-24 DIAGNOSIS — M4312 Spondylolisthesis, cervical region: Secondary | ICD-10-CM | POA: Diagnosis not present

## 2019-08-24 NOTE — Progress Notes (Signed)
Patient prescreened for appointment. Patient has no concerns or questions.  

## 2019-08-25 ENCOUNTER — Inpatient Hospital Stay: Payer: Medicare HMO | Attending: Oncology

## 2019-08-25 ENCOUNTER — Other Ambulatory Visit: Payer: Self-pay

## 2019-08-25 ENCOUNTER — Inpatient Hospital Stay: Payer: Medicare HMO

## 2019-08-25 ENCOUNTER — Inpatient Hospital Stay (HOSPITAL_BASED_OUTPATIENT_CLINIC_OR_DEPARTMENT_OTHER): Payer: Medicare HMO | Admitting: Oncology

## 2019-08-25 VITALS — BP 152/60 | HR 50 | Temp 97.3°F | Wt 135.5 lb

## 2019-08-25 DIAGNOSIS — D693 Immune thrombocytopenic purpura: Secondary | ICD-10-CM

## 2019-08-25 LAB — CBC WITH DIFFERENTIAL/PLATELET
Abs Immature Granulocytes: 0.1 10*3/uL — ABNORMAL HIGH (ref 0.00–0.07)
Basophils Absolute: 0.1 10*3/uL (ref 0.0–0.1)
Basophils Relative: 1 %
Eosinophils Absolute: 0.2 10*3/uL (ref 0.0–0.5)
Eosinophils Relative: 2 %
HCT: 34.3 % — ABNORMAL LOW (ref 36.0–46.0)
Hemoglobin: 10.9 g/dL — ABNORMAL LOW (ref 12.0–15.0)
Immature Granulocytes: 1 %
Lymphocytes Relative: 38 %
Lymphs Abs: 4.3 10*3/uL — ABNORMAL HIGH (ref 0.7–4.0)
MCH: 29 pg (ref 26.0–34.0)
MCHC: 31.8 g/dL (ref 30.0–36.0)
MCV: 91.2 fL (ref 80.0–100.0)
Monocytes Absolute: 0.8 10*3/uL (ref 0.1–1.0)
Monocytes Relative: 7 %
Neutro Abs: 5.8 10*3/uL (ref 1.7–7.7)
Neutrophils Relative %: 51 %
Platelets: 16 10*3/uL — CL (ref 150–400)
RBC: 3.76 MIL/uL — ABNORMAL LOW (ref 3.87–5.11)
RDW: 19 % — ABNORMAL HIGH (ref 11.5–15.5)
WBC: 11.2 10*3/uL — ABNORMAL HIGH (ref 4.0–10.5)
nRBC: 0.2 % (ref 0.0–0.2)

## 2019-08-25 MED ORDER — ROMIPLOSTIM INJECTION 500 MCG
10.0000 ug/kg | Freq: Once | SUBCUTANEOUS | Status: AC
  Administered 2019-08-25: 615 ug via SUBCUTANEOUS
  Filled 2019-08-25: qty 1

## 2019-08-30 DIAGNOSIS — M549 Dorsalgia, unspecified: Secondary | ICD-10-CM | POA: Diagnosis not present

## 2019-08-30 DIAGNOSIS — I10 Essential (primary) hypertension: Secondary | ICD-10-CM | POA: Diagnosis not present

## 2019-08-30 DIAGNOSIS — M1990 Unspecified osteoarthritis, unspecified site: Secondary | ICD-10-CM | POA: Diagnosis not present

## 2019-08-30 DIAGNOSIS — J189 Pneumonia, unspecified organism: Secondary | ICD-10-CM | POA: Diagnosis not present

## 2019-08-30 DIAGNOSIS — G894 Chronic pain syndrome: Secondary | ICD-10-CM | POA: Diagnosis not present

## 2019-08-30 DIAGNOSIS — F0280 Dementia in other diseases classified elsewhere without behavioral disturbance: Secondary | ICD-10-CM | POA: Diagnosis not present

## 2019-08-30 DIAGNOSIS — M810 Age-related osteoporosis without current pathological fracture: Secondary | ICD-10-CM | POA: Diagnosis not present

## 2019-08-30 DIAGNOSIS — G309 Alzheimer's disease, unspecified: Secondary | ICD-10-CM | POA: Diagnosis not present

## 2019-08-30 DIAGNOSIS — M4312 Spondylolisthesis, cervical region: Secondary | ICD-10-CM | POA: Diagnosis not present

## 2019-09-17 NOTE — Progress Notes (Signed)
[place]  [phone]  [phone]     Ariana [name] DOB: 1933/07/01  MR#: [id number]  SWH#:[id number]  Patient Care Team: Idelle Crouch, MD as PCP - General (Internal Medicine)  CHIEF COMPLAINT: Chronic refractory ITP  INTERVAL HISTORY: Patient returns to clinic today for repeat laboratory work, further evaluation, and continuation of Nplate.  She is accompanied by her husband today.  She continues to have chronic weakness and fatigue.  She continues to have mild confusion.  She denies any falls in the interim.  She otherwise feels well.  She has no neurologic complaints.  She denies any recent fevers or illnesses. She denies any chest pain, shortness of breath, cough, or hemoptysis.  She denies any nausea, vomiting, constipation, or diarrhea.  She has no melena or hematochezia.  She has no urinary complaints.  Patient offers no further specific complaints today.  REVIEW OF SYSTEMS:   Review of Systems  Constitutional: Positive for malaise/fatigue. Negative for fever and weight loss.  HENT: Negative for congestion.   Respiratory: Negative.  Negative for cough, hemoptysis and shortness of breath.   Cardiovascular: Negative.  Negative for chest pain and leg swelling.  Gastrointestinal: Negative.  Negative for abdominal pain, blood in stool, diarrhea, melena and nausea.  Genitourinary: Negative.  Negative for dysuria and hematuria.  Musculoskeletal: Negative.  Negative for back pain and falls.  Skin: Negative.  Negative for rash.  Neurological: Positive for weakness. Negative for dizziness, sensory change, focal weakness and headaches.  Endo/Heme/Allergies: Does not bruise/bleed easily.  Psychiatric/Behavioral: Positive for memory loss. Negative for depression. The patient is not nervous/anxious.     As per HPI. Otherwise, a complete review of systems is negative.  PAST MEDICAL HISTORY: Past Medical History:  Diagnosis Date  .  Alzheimer disease ([place])   . Anemia   . Anxiety   . Asthma   . Chronic back pain   . Depression   . Depression   . GERD (gastroesophageal reflux disease)   . Hiatal hernia   . Hypercholesteremia   . ITP (idiopathic thrombocytopenic purpura)   . Osteoarthritis   . Osteoporosis   . Pulmonary emboli ([place])   . Restless legs     PAST SURGICAL HISTORY: Past Surgical History:  Procedure Laterality Date  . CARPAL TUNNEL RELEASE    . ESOPHAGOGASTRODUODENOSCOPY (EGD) WITH PROPOFOL N/A 09/10/2015   Procedure: ESOPHAGOGASTRODUODENOSCOPY (EGD) WITH PROPOFOL;  Surgeon: Lollie Sails, MD;  Location: [facility] ENDOSCOPY;  Service: Endoscopy;  Laterality: N/A;  . IRRIGATION AND DEBRIDEMENT HEMATOMA Right 07/13/2018   Procedure: IRRIGATION AND DEBRIDEMENT HEMATOMA-RIGHT SHIN;  Surgeon: Herbert Pun, MD;  Location: [facility] ORS;  Service: General;  Laterality: Right;  . KYPHOPLASTY N/A 05/02/2019   Procedure: L1 KYPHOPLASTY;  Surgeon: Hessie Knows, MD;  Location: [facility] ORS;  Service: Orthopedics;  Laterality: N/A;  . LUMBAR [place]    . SPLENECTOMY, PARTIAL    . TOTAL ABDOMINAL HYSTERECTOMY      FAMILY HISTORY Family History  Problem Relation Age of Onset  . Stroke Mother     GYNECOLOGIC HISTORY:  No LMP recorded. Patient is postmenopausal.     ADVANCED DIRECTIVES:    HEALTH MAINTENANCE: Social History   Tobacco Use  . Smoking status: Never Smoker  . Smokeless tobacco: Never Used  Substance Use Topics  . Alcohol use: No    Alcohol/week: 0.0 standard drinks  . Drug use: No     Allergies  Allergen Reactions  . Aspirin  Upset stomach  . Diazepam Itching    sneezing  . Other     seasonal allergies  . Tetanus Toxoids     Localized superficial swelling of skin  . Morphine Itching and Rash    Current Outpatient Medications  Medication Sig Dispense Refill  . acetaminophen (TYLENOL) 325 MG tablet Take 2 tablets (650 mg total) by mouth every 6 (six) hours as needed  for mild pain (or Fever >/= 101). 100 tablet 0  . albuterol (PROVENTIL HFA;VENTOLIN HFA) 108 (90 BASE) MCG/ACT inhaler Inhale 2 puffs into the lungs every 6 (six) hours as needed for wheezing or shortness of breath.    . ALPRAZolam (XANAX) 0.5 MG tablet Take 1 tablet (0.5 mg total) by mouth 3 (three) times daily as needed for anxiety. 20 tablet 0  . buPROPion (WELLBUTRIN XL) 150 MG 24 hr tablet Take 150 mg by mouth daily.    . busPIRone (BUSPAR) 15 MG tablet Take 15 mg by mouth 2 (two) times daily.     . Calcium Carbonate-Vit D-Min (CALCIUM 600+D PLUS MINERALS) 600-400 MG-UNIT TABS Take 1 tablet by mouth daily.     . Carboxymethylcellul-Glycerin (LUBRICATING EYE DROPS OP) Place 1 drop into both eyes daily as needed (dry eyes).    . cholecalciferol (VITAMIN D3) 25 MCG (1000 UT) tablet Take 1,000 Units by mouth 2 (two) times a day.    . citalopram (CELEXA) 40 MG tablet Take 40 mg by mouth daily.    . Cyanocobalamin (RA VITAMIN B-12 TR) 1000 MCG TBCR Take 1,000 mcg by mouth daily.     . cyclobenzaprine (FLEXERIL) 5 MG tablet Take 5 mg by mouth 3 (three) times daily as needed for muscle spasms.    Marland Kitchen donepezil (ARICEPT) 10 MG tablet Take 10 mg by mouth at bedtime.     . fluticasone (FLONASE) 50 MCG/ACT nasal spray Place 2 sprays into both nostrils daily as needed for allergies.     . Fluticasone-Salmeterol (ADVAIR) 250-50 MCG/DOSE AEPB Inhale 1 puff into the lungs daily as needed (shortness of breath).     . gabapentin (NEURONTIN) 300 MG capsule Take 300 mg by mouth 4 (four) times daily.    Marland Kitchen HYDROcodone-acetaminophen (NORCO) 5-325 MG tablet Take 1 tablet by mouth every 6 (six) hours as needed for moderate pain. 30 tablet 0  . levothyroxine (SYNTHROID, LEVOTHROID) 50 MCG tablet Take 50 mcg by mouth daily before breakfast. Take 30 to 60 minutes before breakfast.    . montelukast (SINGULAIR) 10 MG tablet Take 10 mg by mouth at bedtime.     . Multiple Vitamin (MULTI-VITAMIN) tablet Take 1 tablet by mouth  daily.     Marland Kitchen nystatin (MYCOSTATIN) 100000 UNIT/ML suspension Take 5 mLs (500,000 Units total) by mouth 4 (four) times daily. 140 mL 0  . oxybutynin (DITROPAN) 5 MG tablet Take 5 mg by mouth 3 (three) times daily.    . pantoprazole (PROTONIX) 20 MG tablet Take 20 mg by mouth 2 (two) times daily.     . pravastatin (PRAVACHOL) 20 MG tablet Take 20 mg by mouth daily.    . ranitidine (ZANTAC) 300 MG tablet Take 300 mg by mouth at bedtime.     . traMADol (ULTRAM) 50 MG tablet Take 50 mg by mouth 2 (two) times daily.   5  . vitamin C (ASCORBIC ACID) 500 MG tablet Take 500 mg by mouth daily.    . vitamin E (E-400) 400 UNIT capsule Take 400 Units by mouth daily.    Marland Kitchen  azithromycin (ZITHROMAX Z-PAK) 250 MG tablet Take 1 tablet (250 mg total) by mouth daily. (Patient not taking: Reported on 08/24/2019) 3 each 0  . benzonatate (TESSALON PERLES) 100 MG capsule Take 1 capsule (100 mg total) by mouth 3 (three) times daily as needed. (Patient not taking: Reported on 09/21/2019) 30 capsule 0  . cefdinir (OMNICEF) 300 MG capsule Take 1 capsule (300 mg total) by mouth 2 (two) times daily. (Patient not taking: Reported on 08/24/2019) 5 capsule 0  . memantine (NAMENDA) 10 MG tablet Take 10 mg by mouth daily.     . Multiple Vitamins-Minerals (PRESERVISION AREDS 2) CAPS Take 1 capsule by mouth 2 (two) times a day.    Marland Kitchen NARCAN 4 MG/0.1ML LIQD nasal spray kit Place 1 spray into the nose once.      No current facility-administered medications for this visit.    OBJECTIVE: BP (!) 151/68 (BP Location: Left Arm, Patient Position: Sitting, Cuff Size: Normal)   Pulse (!) 55   Temp (!) 95.3 F (35.2 C) (Tympanic)   Resp 18   Wt 133 lb 14.4 oz (60.7 kg)   SpO2 100%   BMI 21.61 kg/m    Body mass index is 21.61 kg/m.    ECOG FS:1 - Symptomatic but completely ambulatory  General: Well-developed, well-nourished, no acute distress. Eyes: Pink conjunctiva, anicteric sclera. HEENT: Normocephalic, moist mucous  membranes. Lungs: No audible wheezing or coughing. Heart: Regular rate and rhythm. Abdomen: Soft, nontender, no obvious distention. Musculoskeletal: No edema, cyanosis, or clubbing. Neuro: Alert, answering all questions appropriately. Cranial nerves grossly intact. Skin: No rashes or petechiae noted. Psych: Normal affect.   LAB RESULTS:  Orders Only on 09/22/2019  Component Date Value Ref Range Status  . WBC 09/22/2019 6.4  4.0 - 10.5 K/uL Final  . RBC 09/22/2019 4.41  3.87 - 5.11 MIL/uL Final  . Hemoglobin 09/22/2019 13.0  12.0 - 15.0 g/dL Final  . HCT 09/22/2019 41.5  36.0 - 46.0 % Final  . MCV 09/22/2019 94.1  80.0 - 100.0 fL Final  . MCH 09/22/2019 29.5  26.0 - 34.0 pg Final  . MCHC 09/22/2019 31.3  30.0 - 36.0 g/dL Final  . RDW 09/22/2019 17.9* 11.5 - 15.5 % Final  . Platelets 09/22/2019 40* 150 - 400 K/uL Final   Comment: SPECIMEN CHECKED FOR CLOTS Immature Platelet Fraction may be clinically indicated, consider ordering this additional test [id number]   . nRBC 09/22/2019 0.0  0.0 - 0.2 % Final  . Neutrophils Relative % 09/22/2019 48  % Final  . Neutro Abs 09/22/2019 3.1  1.7 - 7.7 K/uL Final  . Lymphocytes Relative 09/22/2019 38  % Final  . Lymphs Abs 09/22/2019 2.4  0.7 - 4.0 K/uL Final  . Monocytes Relative 09/22/2019 11  % Final  . Monocytes Absolute 09/22/2019 0.7  0.1 - 1.0 K/uL Final  . Eosinophils Relative 09/22/2019 2  % Final  . Eosinophils Absolute 09/22/2019 0.2  0.0 - 0.5 K/uL Final  . Basophils Relative 09/22/2019 1  % Final  . Basophils Absolute 09/22/2019 0.1  0.0 - 0.1 K/uL Final  . Immature Granulocytes 09/22/2019 0  % Final  . Abs Immature Granulocytes 09/22/2019 0.02  0.00 - 0.07 K/uL Final   Performed at [facility], [address] [place], [place] [address]    STUDIES: No results found.  ASSESSMENT:  Chronic refractory ITP  PLAN:   1. Chronic refractory ITP: Patient had a poor response to Prednisone, WinRho, and Rituxan. She is  status post splenectomy in 2007.  She received IVIG and Nplate she received while at [facility] with significant improvement of her platelets.  If Nplate no longer was effective, could retry IVIG at a future date.  Patient is currently receiving the max dose of Nplate at 10 mcg/kg.  Can also consider Promacta 12.52m or Tavalisse 100 mg BID if needed.  Patient's current platelet count is 40, therefore will proceed with treatment today.  She continues to only require treatment every 4 weeks.  Return to clinic in 4 weeks for further evaluation and continuation of Nplate. 2. Weakness and fatigue: Chronic and unchanged. 3. Depression/dementia: Chronic and unchanged.  Patient is now at [employer] 4.  Anemia: Resolved.  Hemoglobin is 13.0 today.  5.  Leukocytosis: Resolved.  Patient expressed understanding and was in agreement with this plan. She also understands that She can call clinic at any time with any questions, concerns, or complaints.    TLloyd Huger MD   09/22/2019 3:17 PM

## 2019-09-21 ENCOUNTER — Encounter: Payer: Self-pay | Admitting: Oncology

## 2019-09-21 ENCOUNTER — Other Ambulatory Visit: Payer: Self-pay

## 2019-09-21 NOTE — Progress Notes (Signed)
Patient is here for  a follow-up, no complaints or concerns today.

## 2019-09-22 ENCOUNTER — Inpatient Hospital Stay: Payer: Medicare HMO | Attending: Oncology

## 2019-09-22 ENCOUNTER — Encounter: Payer: Self-pay | Admitting: Oncology

## 2019-09-22 ENCOUNTER — Inpatient Hospital Stay: Payer: Medicare HMO

## 2019-09-22 ENCOUNTER — Inpatient Hospital Stay: Payer: Medicare HMO | Admitting: Oncology

## 2019-09-22 ENCOUNTER — Other Ambulatory Visit: Payer: Self-pay

## 2019-09-22 VITALS — BP 151/68 | HR 55 | Temp 95.3°F | Resp 18 | Wt 133.9 lb

## 2019-09-22 DIAGNOSIS — D693 Immune thrombocytopenic purpura: Secondary | ICD-10-CM

## 2019-09-22 LAB — CBC WITH DIFFERENTIAL/PLATELET
Abs Immature Granulocytes: 0.02 10*3/uL (ref 0.00–0.07)
Basophils Absolute: 0.1 10*3/uL (ref 0.0–0.1)
Basophils Relative: 1 %
Eosinophils Absolute: 0.2 10*3/uL (ref 0.0–0.5)
Eosinophils Relative: 2 %
HCT: 41.5 % (ref 36.0–46.0)
Hemoglobin: 13 g/dL (ref 12.0–15.0)
Immature Granulocytes: 0 %
Lymphocytes Relative: 38 %
Lymphs Abs: 2.4 10*3/uL (ref 0.7–4.0)
MCH: 29.5 pg (ref 26.0–34.0)
MCHC: 31.3 g/dL (ref 30.0–36.0)
MCV: 94.1 fL (ref 80.0–100.0)
Monocytes Absolute: 0.7 10*3/uL (ref 0.1–1.0)
Monocytes Relative: 11 %
Neutro Abs: 3.1 10*3/uL (ref 1.7–7.7)
Neutrophils Relative %: 48 %
Platelets: 40 10*3/uL — ABNORMAL LOW (ref 150–400)
RBC: 4.41 MIL/uL (ref 3.87–5.11)
RDW: 17.9 % — ABNORMAL HIGH (ref 11.5–15.5)
WBC: 6.4 10*3/uL (ref 4.0–10.5)
nRBC: 0 % (ref 0.0–0.2)

## 2019-09-22 MED ORDER — ROMIPLOSTIM INJECTION 500 MCG
10.0000 ug/kg | Freq: Once | SUBCUTANEOUS | Status: AC
  Administered 2019-09-22: 605 ug via SUBCUTANEOUS
  Filled 2019-09-22: qty 1

## 2019-10-03 IMAGING — CT CT HEAD W/O CM
4 series · 16 of 47 positions shown, 18 images · non-contrast
Comparison: Brain MRI 05/16/2014. Head CT 07/11/2018.

CLINICAL DATA: 85-year-old female with altered mental status. Last
known well 5555 hours yesterday.

EXAM:
CT HEAD WITHOUT CONTRAST
TECHNIQUE: Contiguous axial images were obtained from the base of the skull
through the vertex without intravenous contrast.

[Series 2: head bone · axial · 0.39mm/px · z∈[-206,-178]mm · 3 of 73 slices shown]
[im 8/73  bone]
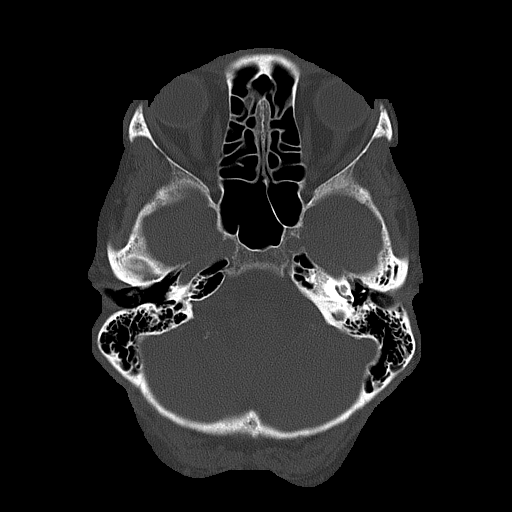
[im 15/73  bone]
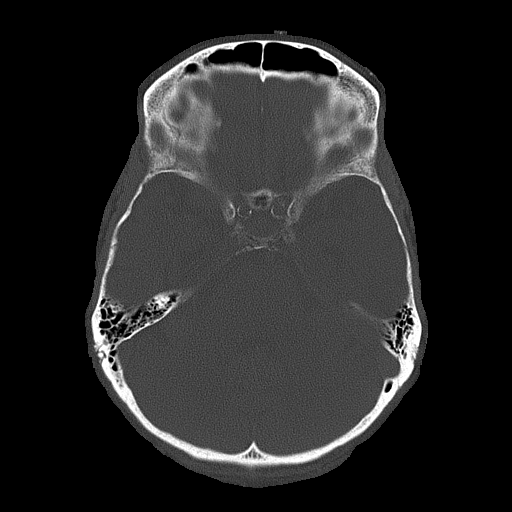
[im 22/73  bone]
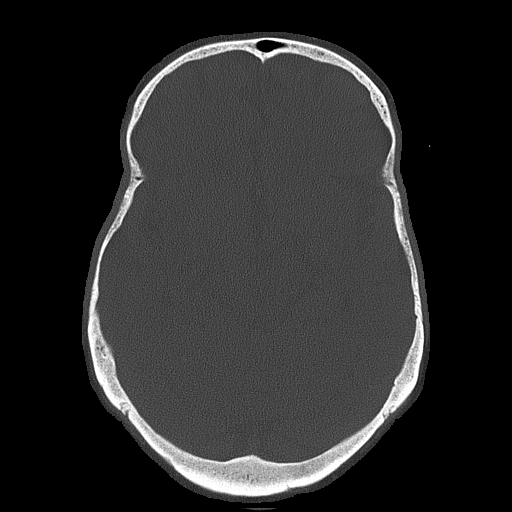

[Series 3: head wo · axial · 0.39mm/px · z∈[-205,-95]mm · 7 of 30 slices shown, 9 images]
[im 4/30  brain]
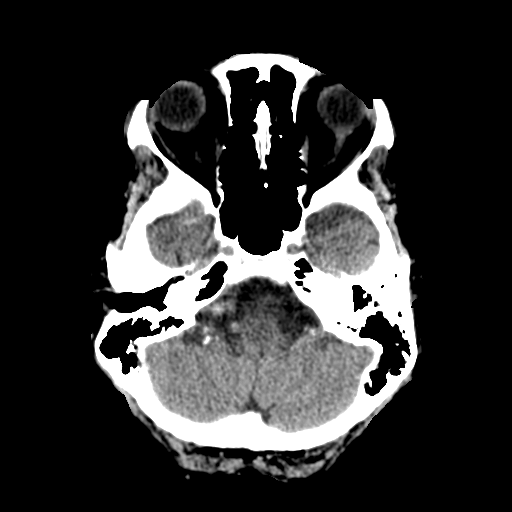
[im 4/30  bone]
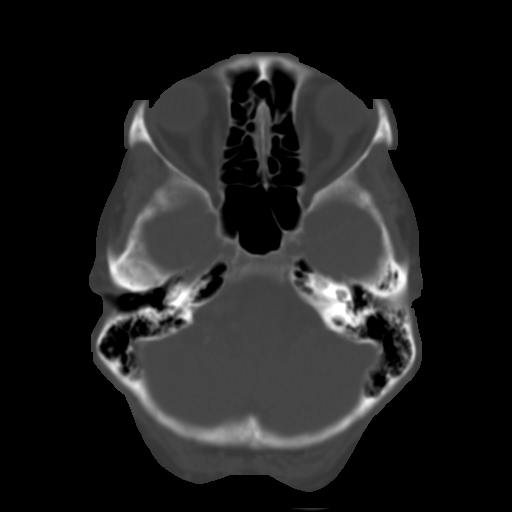
[im 8/30  brain]
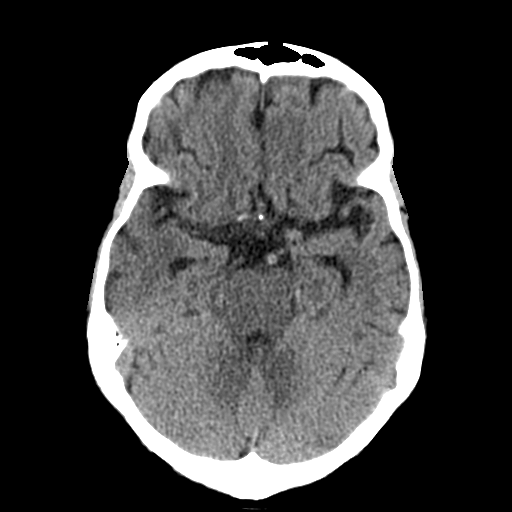
[im 11/30  brain]
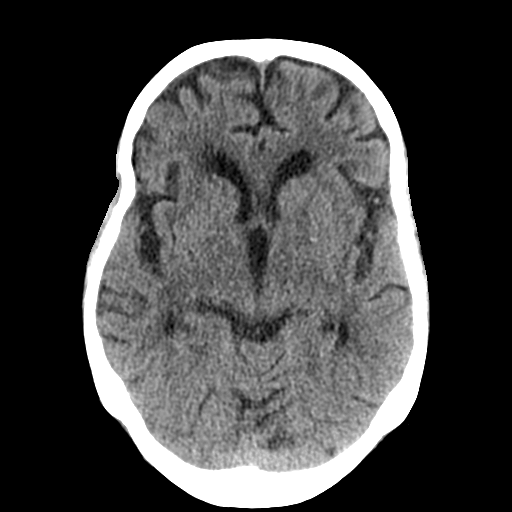
[im 15/30  brain]
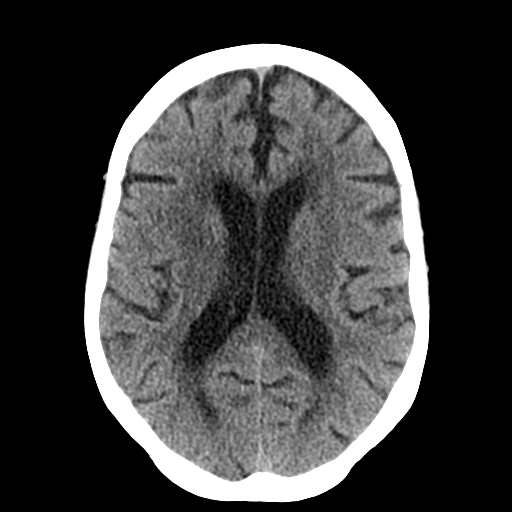
[im 19/30  brain]
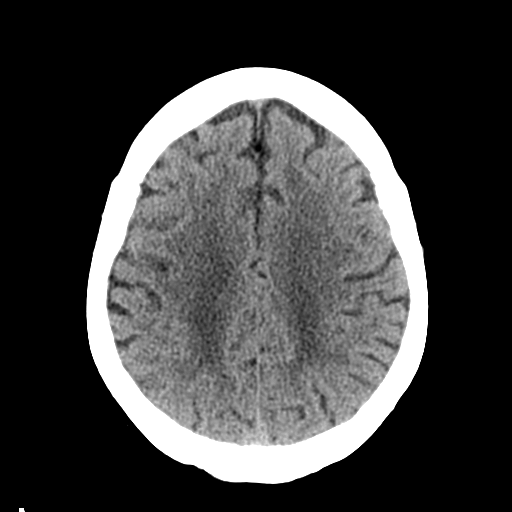
[im 19/30  bone]
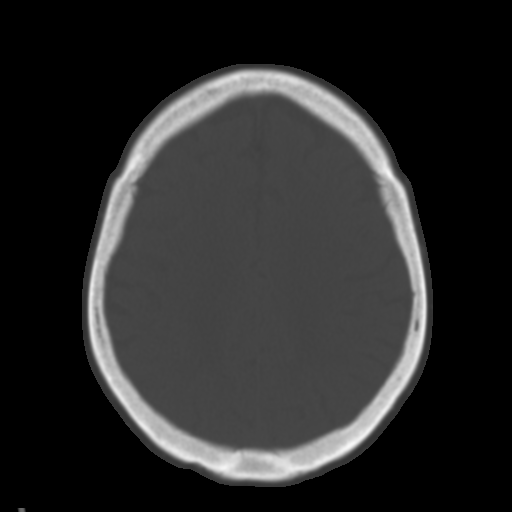
[im 22/30  brain]
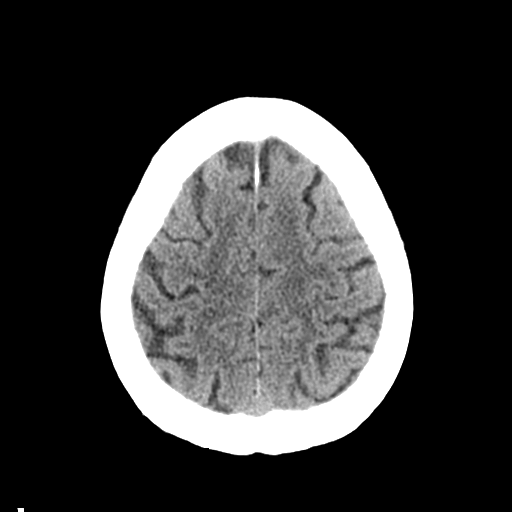
[im 26/30  brain]
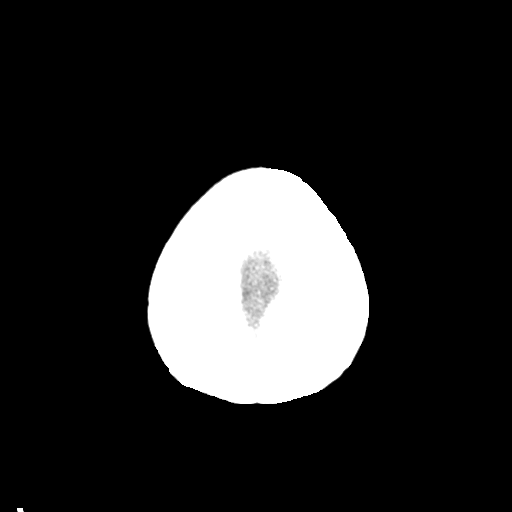

[Series 4: coronal soft tissue · coronal · 0.27mm/px · 3 of 64 slices shown]
[im 22/64  brain]
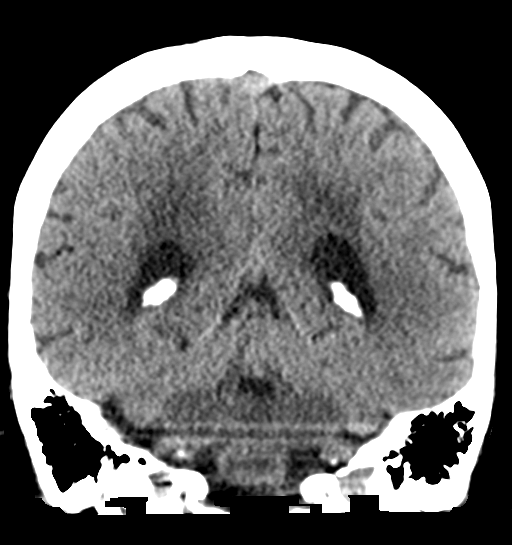
[im 29/64  brain]
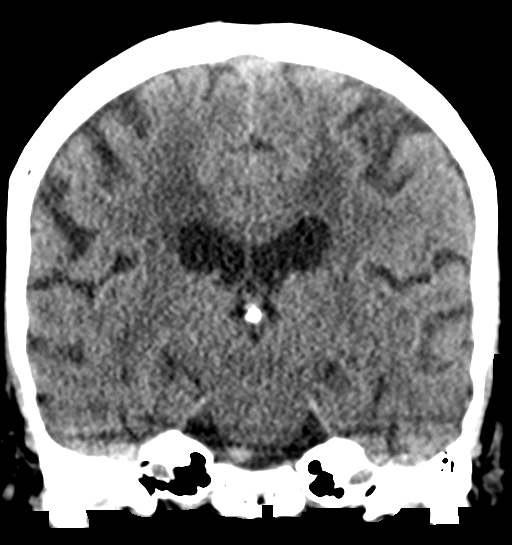
[im 36/64  brain]
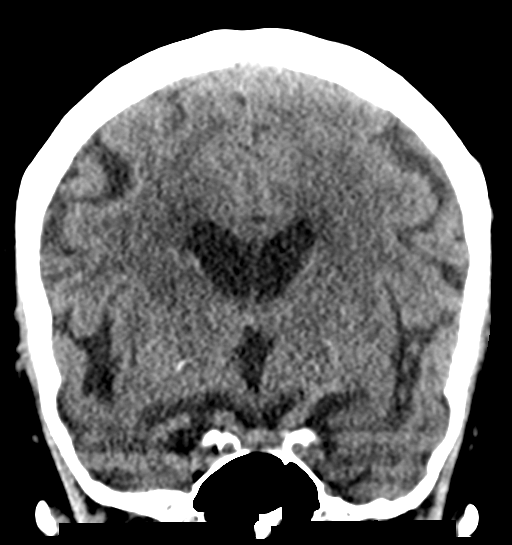

[Series 5: sagittal soft tissue · sagittal · 0.29mm/px · 3 of 47 slices shown]
[im 16/47  brain]
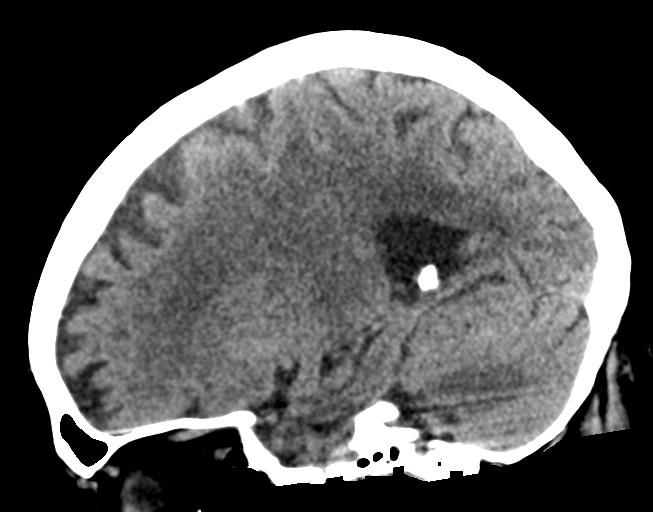
[im 24/47  brain]
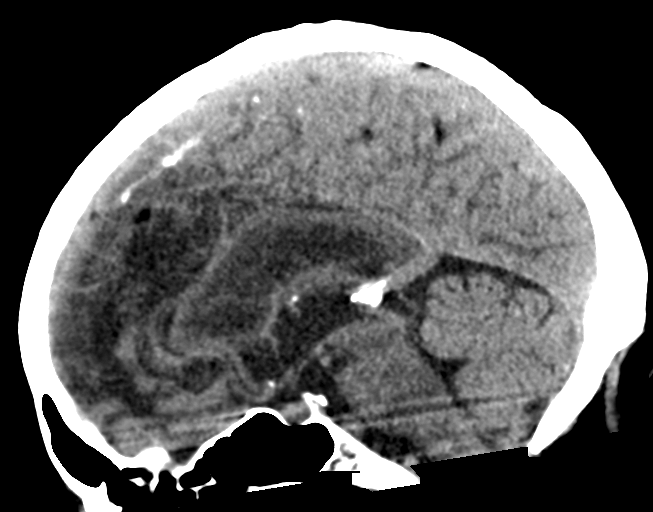
[im 31/47  brain]
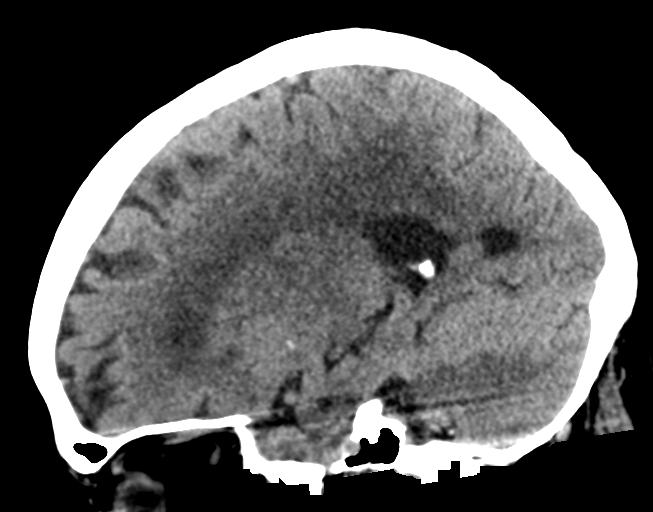

[16 of 47 positions shown; findings below may reference images not displayed]

FINDINGS: Brain: Patchy and confluent cerebral white matter hypodensity
appears stable since 7003. Chronic involvement of the deep white
matter capsules. Mild heterogeneity in the deep gray nuclei.

No midline shift, ventriculomegaly, mass effect, evidence of mass
lesion, intracranial hemorrhage or evidence of cortically based
acute infarction. No cortical encephalomalacia.

Vascular: Calcified atherosclerosis at the skull base. No suspicious
intracranial vascular hyperdensity. Dominant right vertebral artery.

Skull: No acute osseous abnormality identified.

Sinuses/Orbits: Visualized paranasal sinuses and mastoids are stable
and well pneumatized.

Other: No acute orbit or scalp soft tissue finding.
IMPRESSION: No acute intracranial abnormality.

Stable non contrast CT appearance of the brain since 7003 with
evidence of chronic small vessel disease.

## 2019-10-03 IMAGING — CT CT CERVICAL SPINE W/O CM
3 of 4 series · 10 of 33 positions shown, 11 images · non-contrast
Comparison: Head CT today reported separately. MRI cervical spine
09/03/2007.

CLINICAL DATA: 85-year-old female with altered mental status. Last
known well 2255 hours yesterday.

EXAM:
CT CERVICAL SPINE WITHOUT CONTRAST
TECHNIQUE: Multidetector CT imaging of the cervical spine was performed without
intravenous contrast. Multiplanar CT image reconstructions were also
generated.

[Series 6: sagittal bone · sagittal · 0.21mm/px · 5 of 58 slices shown]
[im 20/58  bone]
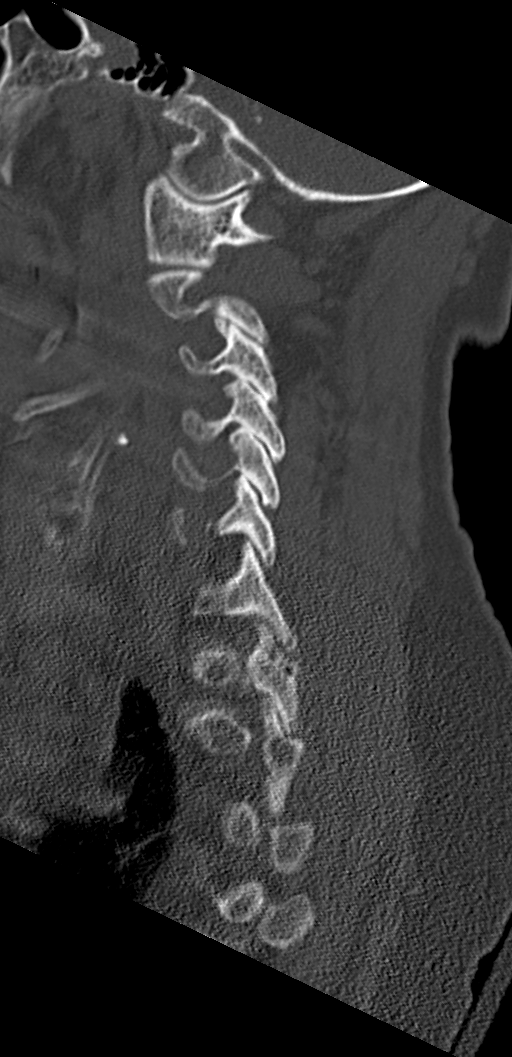
[im 24/58  bone]
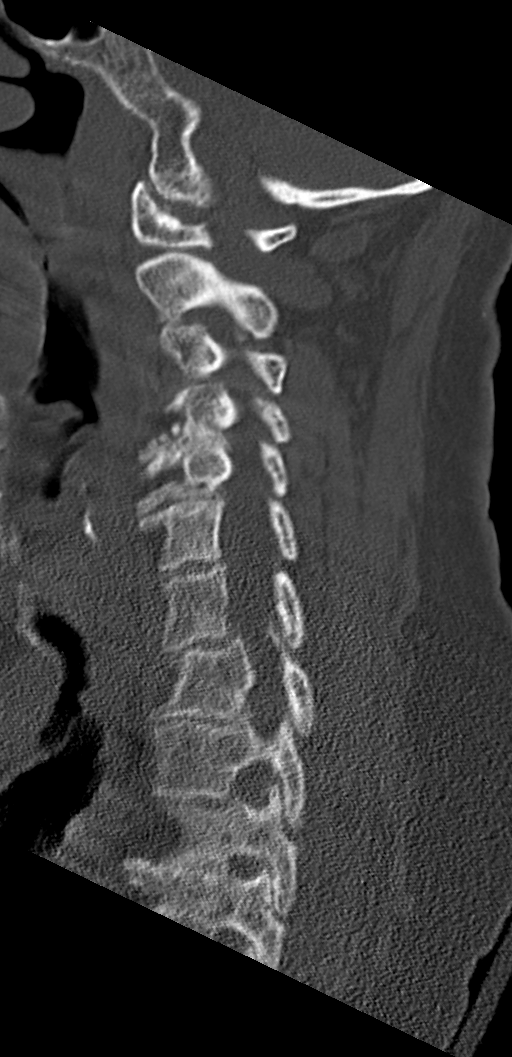
[im 29/58  bone]
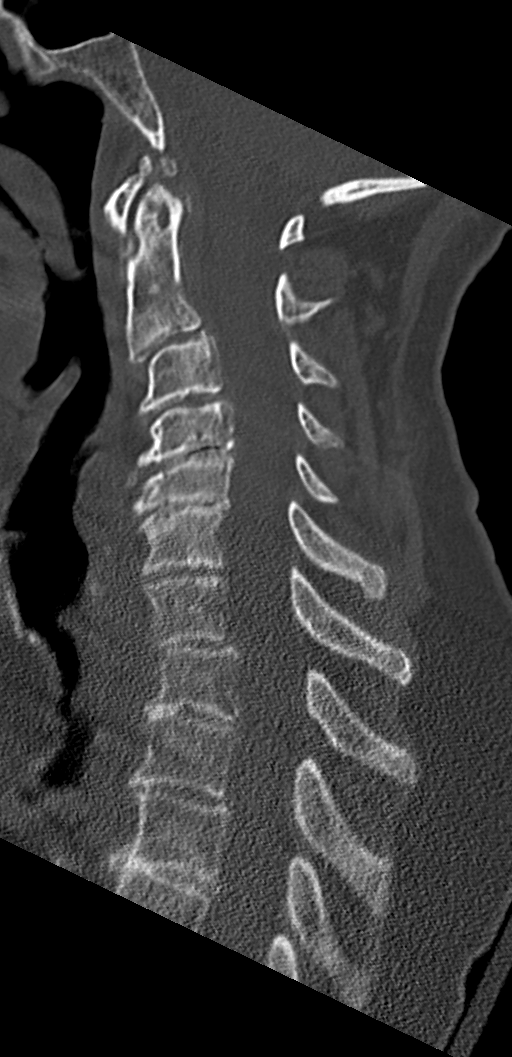
[im 34/58  bone]
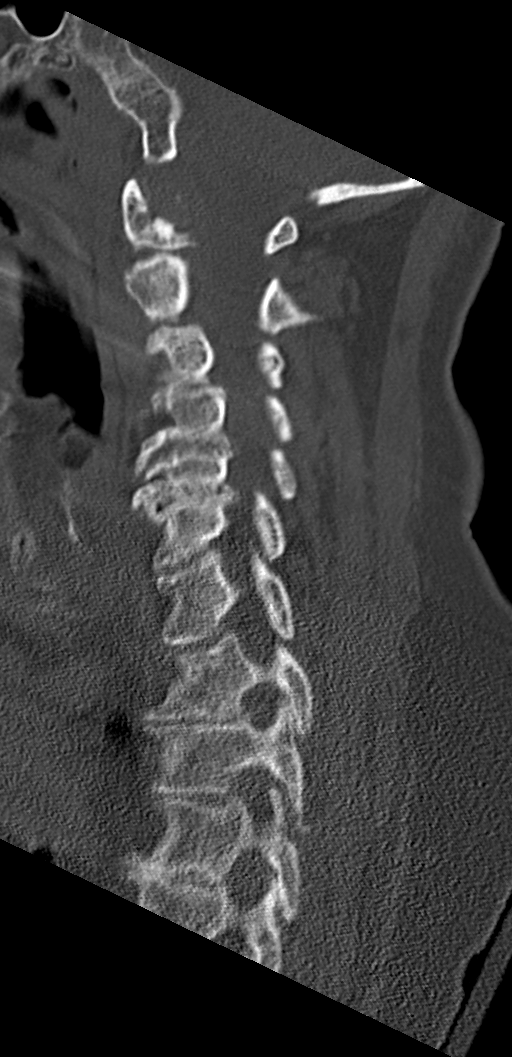
[im 39/58  bone]
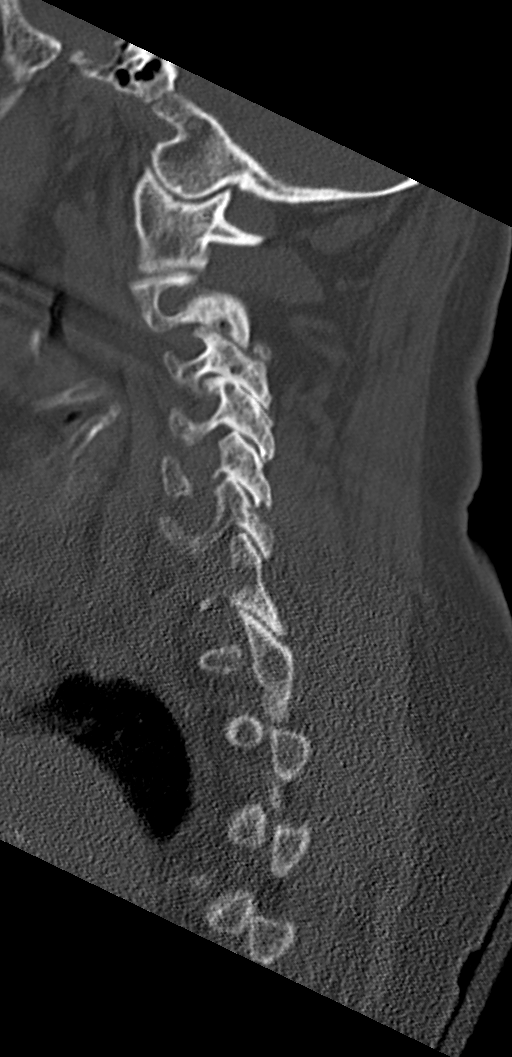

[Series 7: coronal bone · coronal · 0.23mm/px · 3 of 56 slices shown]
[im 12/56  bone]
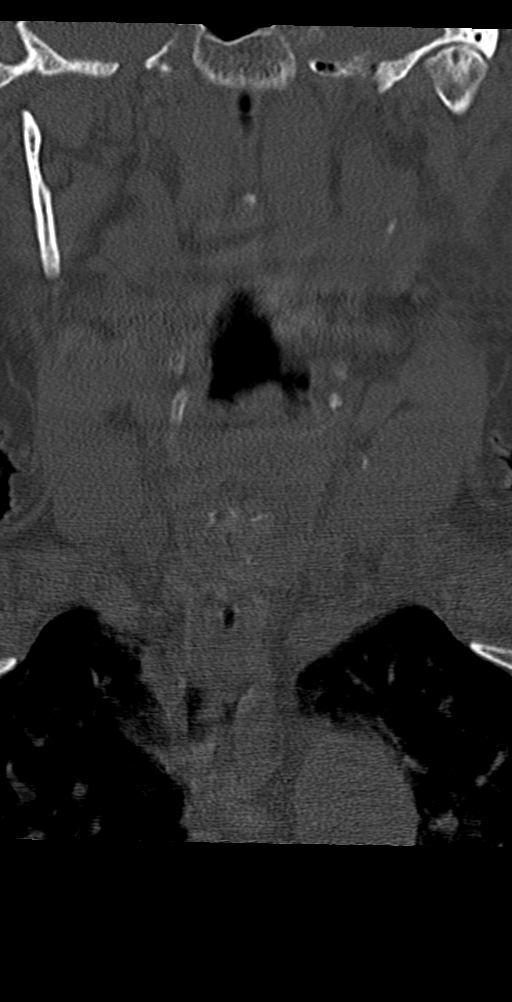
[im 23/56  bone]
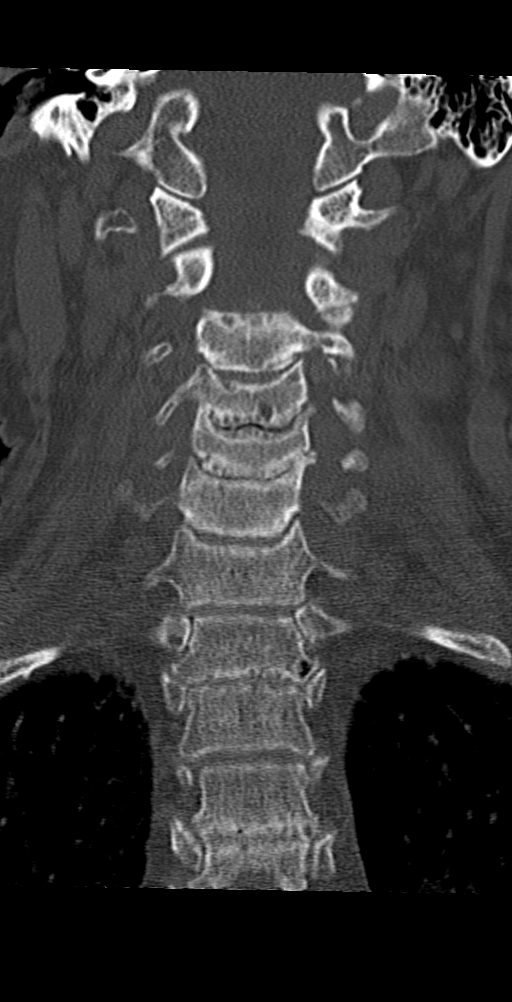
[im 34/56  bone]
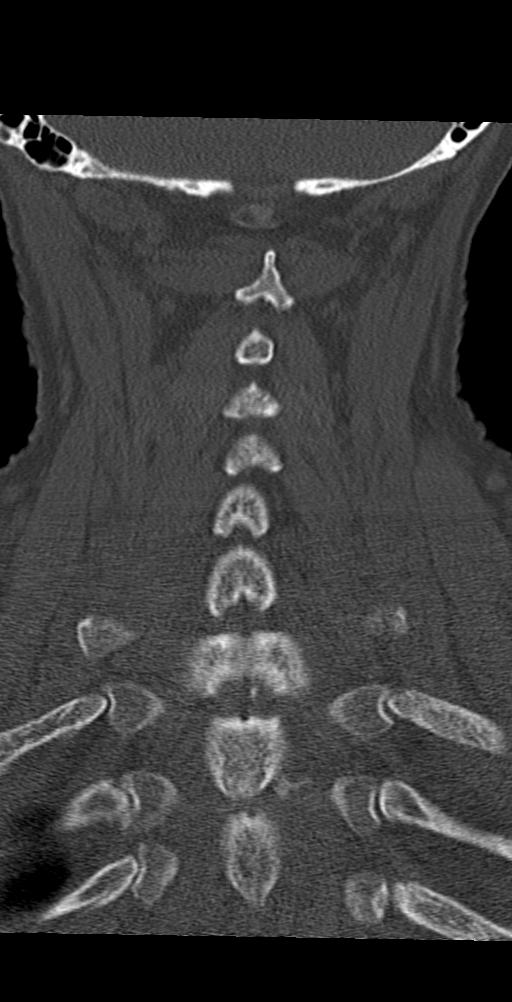

[Series 8: orthogonal bone · axial · 0.21mm/px · z∈[-357,-271]mm · 2 of 114 slices shown, 3 images]
[im 33/114  soft-tissue]
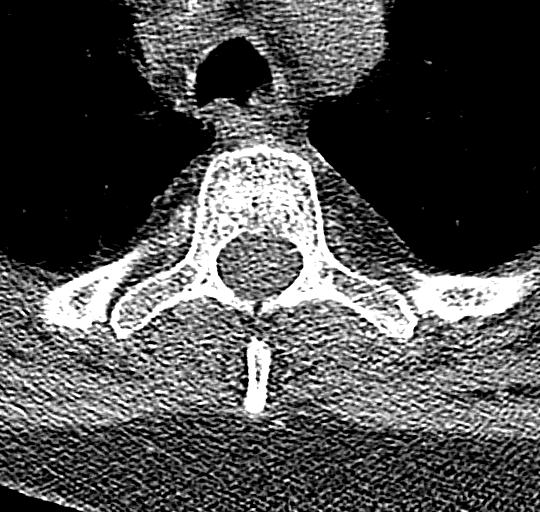
[im 33/114  bone]
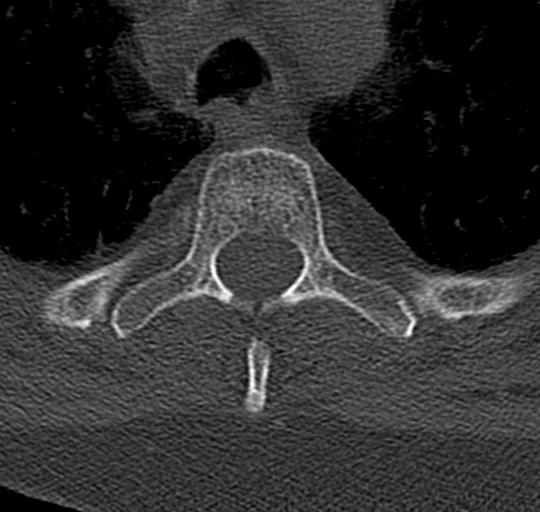
[im 81/114  bone]
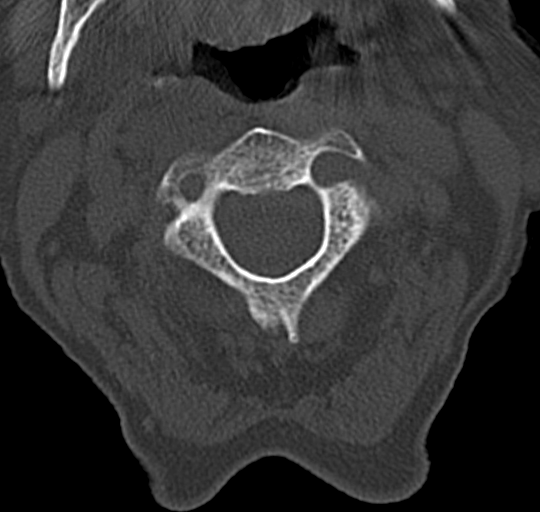

[10 of 33 positions shown; findings below may reference images not displayed]

FINDINGS: Alignment: Mild reversal of cervical lordosis since the 1559 MRI
which demonstrated straightening. Mild anterolisthesis of C2 on C3
appears to be degenerative in nature, associated with left greater
than right facet arthropathy. Similar mild anterolisthesis at C7-T1
with facet degeneration greater on the right. Bilateral posterior
element alignment is within normal limits.

Skull base and vertebrae: Visualized skull base is intact. No
atlanto-occipital dissociation. No acute osseous abnormality
identified.

Soft tissues and spinal canal: No prevertebral fluid or swelling. No
visible canal hematoma. Calcified carotid atherosclerosis in the
neck. Otherwise negative visible noncontrast neck soft tissues.

Disc levels: Chronic degenerative changes at the anterior C1-C2
articulation and widespread advanced disc, endplate, and facet
degeneration. Vacuum disc and vacuum containing subchondral cysts.
However, fairly capacious cervical spinal canal, no significant
spinal stenosis suspected.

Upper chest: Visible upper thoracic levels appear grossly intact
with degenerative changes. Negative visible lung apices. Calcified
aortic atherosclerosis.
IMPRESSION: 1.  No acute osseous abnormality identified in the cervical spine.
2. Widespread advanced cervical spine degeneration including mild
spondylolisthesis, but no significant cervical spinal stenosis
suspected.

## 2019-10-10 DIAGNOSIS — Z86711 Personal history of pulmonary embolism: Secondary | ICD-10-CM | POA: Diagnosis not present

## 2019-10-10 DIAGNOSIS — Z8673 Personal history of transient ischemic attack (TIA), and cerebral infarction without residual deficits: Secondary | ICD-10-CM | POA: Diagnosis not present

## 2019-10-10 DIAGNOSIS — J209 Acute bronchitis, unspecified: Secondary | ICD-10-CM | POA: Diagnosis not present

## 2019-10-10 DIAGNOSIS — I10 Essential (primary) hypertension: Secondary | ICD-10-CM | POA: Diagnosis not present

## 2019-10-11 DIAGNOSIS — Z961 Presence of intraocular lens: Secondary | ICD-10-CM | POA: Diagnosis not present

## 2019-10-13 NOTE — Progress Notes (Signed)
[place]  Telephone:([phone]) [phone]  Fax:([phone]) [phone]     Ariana [name] DOB: [birth year]  MR#: [id number]  PPI#:[id number]  Patient Care Team: Idelle Crouch, MD as PCP - General (Internal Medicine)  CHIEF COMPLAINT: Chronic refractory ITP  INTERVAL HISTORY: Patient returns to clinic today for repeat laboratory, further evaluation, and continuation of Nplate.  She continues to have memory loss and is a poor historian secondary to underlying dementia.  She has chronic weakness and fatigue.  She denies any falls in the interim.  She otherwise feels well.  She has no neurologic complaints.  She denies any recent fevers or illnesses. She denies any chest pain, shortness of breath, cough, or hemoptysis.  She denies any nausea, vomiting, constipation, or diarrhea.  She has no melena or hematochezia.  She has no urinary complaints.  Patient offers no further specific complaints today.  REVIEW OF SYSTEMS:   Review of Systems  Constitutional: Positive for malaise/fatigue. Negative for fever and weight loss.  HENT: Negative for congestion.   Respiratory: Negative.  Negative for cough, hemoptysis and shortness of breath.   Cardiovascular: Negative.  Negative for chest pain and leg swelling.  Gastrointestinal: Negative.  Negative for abdominal pain, blood in stool, diarrhea, melena and nausea.  Genitourinary: Negative.  Negative for dysuria and hematuria.  Musculoskeletal: Negative.  Negative for back pain and falls.  Skin: Negative.  Negative for rash.  Neurological: Positive for weakness. Negative for dizziness, sensory change, focal weakness and headaches.  Endo/Heme/Allergies: Does not bruise/bleed easily.  Psychiatric/Behavioral: Positive for memory loss. Negative for depression. The patient is not nervous/anxious.     As per HPI. Otherwise, a complete review of systems is negative.  PAST MEDICAL HISTORY: Past Medical History:  Diagnosis Date  . Alzheimer  disease ([place])   . Anemia   . Anxiety   . Asthma   . Chronic back pain   . Depression   . Depression   . GERD (gastroesophageal reflux disease)   . Hiatal hernia   . Hypercholesteremia   . ITP (idiopathic thrombocytopenic purpura)   . Osteoarthritis   . Osteoporosis   . Pulmonary emboli ([place])   . Restless legs     PAST SURGICAL HISTORY: Past Surgical History:  Procedure Laterality Date  . CARPAL TUNNEL RELEASE    . ESOPHAGOGASTRODUODENOSCOPY (EGD) WITH PROPOFOL N/A 09/10/2015   Procedure: ESOPHAGOGASTRODUODENOSCOPY (EGD) WITH PROPOFOL;  Surgeon: Lollie Sails, MD;  Location: [facility] ENDOSCOPY;  Service: Endoscopy;  Laterality: N/A;  . IRRIGATION AND DEBRIDEMENT HEMATOMA Right 07/13/2018   Procedure: IRRIGATION AND DEBRIDEMENT HEMATOMA-RIGHT SHIN;  Surgeon: Herbert Pun, MD;  Location: [facility] ORS;  Service: General;  Laterality: Right;  . KYPHOPLASTY N/A 05/02/2019   Procedure: L1 KYPHOPLASTY;  Surgeon: Hessie Knows, MD;  Location: [facility] ORS;  Service: Orthopedics;  Laterality: N/A;  . LUMBAR [place]    . SPLENECTOMY, PARTIAL    . TOTAL ABDOMINAL HYSTERECTOMY      FAMILY HISTORY Family History  Problem Relation Age of Onset  . Stroke Mother     GYNECOLOGIC HISTORY:  No LMP recorded. Patient is postmenopausal.     ADVANCED DIRECTIVES:    HEALTH MAINTENANCE: Social History   Tobacco Use  . Smoking status: Never Smoker  . Smokeless tobacco: Never Used  Substance Use Topics  . Alcohol use: No    Alcohol/week: 0.0 standard drinks  . Drug use: No     Allergies  Allergen Reactions  . Aspirin     Upset  stomach  . Diazepam Itching    sneezing  . Other     seasonal allergies  . Tetanus Toxoids     Localized superficial swelling of skin  . Morphine Itching and Rash    Current Outpatient Medications  Medication Sig Dispense Refill  . acetaminophen (TYLENOL) 325 MG tablet Take 2 tablets (650 mg total) by mouth every 6 (six) hours as needed for mild  pain (or Fever >/= 101). 100 tablet 0  . albuterol (PROVENTIL HFA;VENTOLIN HFA) 108 (90 BASE) MCG/ACT inhaler Inhale 2 puffs into the lungs every 6 (six) hours as needed for wheezing or shortness of breath.    . ALPRAZolam (XANAX) 0.5 MG tablet Take 1 tablet (0.5 mg total) by mouth 3 (three) times daily as needed for anxiety. 20 tablet 0  . azithromycin (ZITHROMAX Z-PAK) 250 MG tablet Take 1 tablet (250 mg total) by mouth daily. (Patient not taking: Reported on 08/24/2019) 3 each 0  . benzonatate (TESSALON PERLES) 100 MG capsule Take 1 capsule (100 mg total) by mouth 3 (three) times daily as needed. (Patient not taking: Reported on 09/21/2019) 30 capsule 0  . buPROPion (WELLBUTRIN XL) 150 MG 24 hr tablet Take 150 mg by mouth daily.    . busPIRone (BUSPAR) 15 MG tablet Take 15 mg by mouth 2 (two) times daily.     . Calcium Carbonate-Vit D-Min (CALCIUM 600+D PLUS MINERALS) 600-400 MG-UNIT TABS Take 1 tablet by mouth daily.     . Carboxymethylcellul-Glycerin (LUBRICATING EYE DROPS OP) Place 1 drop into both eyes daily as needed (dry eyes).    . cefdinir (OMNICEF) 300 MG capsule Take 1 capsule (300 mg total) by mouth 2 (two) times daily. (Patient not taking: Reported on 08/24/2019) 5 capsule 0  . cholecalciferol (VITAMIN D3) 25 MCG (1000 UT) tablet Take 1,000 Units by mouth 2 (two) times a day.    . citalopram (CELEXA) 40 MG tablet Take 40 mg by mouth daily.    . Cyanocobalamin (RA VITAMIN B-12 TR) 1000 MCG TBCR Take 1,000 mcg by mouth daily.     . cyclobenzaprine (FLEXERIL) 5 MG tablet Take 5 mg by mouth 3 (three) times daily as needed for muscle spasms.    Marland Kitchen donepezil (ARICEPT) 10 MG tablet Take 10 mg by mouth at bedtime.     . fluticasone (FLONASE) 50 MCG/ACT nasal spray Place 2 sprays into both nostrils daily as needed for allergies.     . Fluticasone-Salmeterol (ADVAIR) 250-50 MCG/DOSE AEPB Inhale 1 puff into the lungs daily as needed (shortness of breath).     . gabapentin (NEURONTIN) 300 MG  capsule Take 300 mg by mouth 4 (four) times daily.    Marland Kitchen HYDROcodone-acetaminophen (NORCO) 5-325 MG tablet Take 1 tablet by mouth every 6 (six) hours as needed for moderate pain. 30 tablet 0  . levothyroxine (SYNTHROID, LEVOTHROID) 50 MCG tablet Take 50 mcg by mouth daily before breakfast. Take 30 to 60 minutes before breakfast.    . memantine (NAMENDA) 10 MG tablet Take 10 mg by mouth daily.     . montelukast (SINGULAIR) 10 MG tablet Take 10 mg by mouth at bedtime.     . Multiple Vitamin (MULTI-VITAMIN) tablet Take 1 tablet by mouth daily.     . Multiple Vitamins-Minerals (PRESERVISION AREDS 2) CAPS Take 1 capsule by mouth 2 (two) times a day.    Marland Kitchen NARCAN 4 MG/0.1ML LIQD nasal spray kit Place 1 spray into the nose once.     . nystatin (MYCOSTATIN) 100000  UNIT/ML suspension Take 5 mLs (500,000 Units total) by mouth 4 (four) times daily. 140 mL 0  . oxybutynin (DITROPAN) 5 MG tablet Take 5 mg by mouth 3 (three) times daily.    . pantoprazole (PROTONIX) 20 MG tablet Take 20 mg by mouth 2 (two) times daily.     . pravastatin (PRAVACHOL) 20 MG tablet Take 20 mg by mouth daily.    . ranitidine (ZANTAC) 300 MG tablet Take 300 mg by mouth at bedtime.     . traMADol (ULTRAM) 50 MG tablet Take 50 mg by mouth 2 (two) times daily.   5  . vitamin C (ASCORBIC ACID) 500 MG tablet Take 500 mg by mouth daily.    . vitamin E (E-400) 400 UNIT capsule Take 400 Units by mouth daily.     No current facility-administered medications for this visit.    OBJECTIVE: BP (!) 142/49 (BP Location: Left Arm, Patient Position: Sitting)   Pulse 60   Temp 97.7 F (36.5 C) (Tympanic)   Resp 17   Wt 140 lb (63.5 kg)   SpO2 100%   BMI 22.60 kg/m    Body mass index is 22.6 kg/m.    ECOG FS:1 - Symptomatic but completely ambulatory  General: Well-developed, well-nourished, no acute distress. Eyes: Pink conjunctiva, anicteric sclera. HEENT: Normocephalic, moist mucous membranes. Lungs: No audible wheezing or  coughing. Heart: Regular rate and rhythm. Abdomen: Soft, nontender, no obvious distention. Musculoskeletal: No edema, cyanosis, or clubbing. Neuro: Alert, answering all questions appropriately. Cranial nerves grossly intact. Skin: No rashes or petechiae noted. Psych: Normal affect.   LAB RESULTS:  Appointment on 10/20/2019  Component Date Value Ref Range Status  . WBC 10/20/2019 12.0* 4.0 - 10.5 K/uL Final  . RBC 10/20/2019 4.03  3.87 - 5.11 MIL/uL Final  . Hemoglobin 10/20/2019 12.0  12.0 - 15.0 g/dL Final  . HCT 10/20/2019 38.2  36.0 - 46.0 % Final  . MCV 10/20/2019 94.8  80.0 - 100.0 fL Final  . MCH 10/20/2019 29.8  26.0 - 34.0 pg Final  . MCHC 10/20/2019 31.4  30.0 - 36.0 g/dL Final  . RDW 10/20/2019 18.2* 11.5 - 15.5 % Final  . Platelets 10/20/2019 21* 150 - 400 K/uL Final   Comment: REPEATED TO VERIFY PLATELET COUNT CONFIRMED BY SMEAR Immature Platelet Fraction may be clinically indicated, consider ordering this additional test [id number] THIS CRITICAL RESULT HAS VERIFIED AND BEEN CALLED TO DR. [place] ON 01 08 2021 AT 1026, AND HAS BEEN READ BACK. '@9'$ :59   . nRBC 10/20/2019 0.0  0.0 - 0.2 % Final  . Neutrophils Relative % 10/20/2019 52  % Final  . Neutro Abs 10/20/2019 6.2  1.7 - 7.7 K/uL Final  . Lymphocytes Relative 10/20/2019 32  % Final  . Lymphs Abs 10/20/2019 3.9  0.7 - 4.0 K/uL Final  . Monocytes Relative 10/20/2019 12  % Final  . Monocytes Absolute 10/20/2019 1.4* 0.1 - 1.0 K/uL Final  . Eosinophils Relative 10/20/2019 2  % Final  . Eosinophils Absolute 10/20/2019 0.3  0.0 - 0.5 K/uL Final  . Basophils Relative 10/20/2019 1  % Final  . Basophils Absolute 10/20/2019 0.1  0.0 - 0.1 K/uL Final  . Smear Review 10/20/2019 PLATELETS VARY IN SIZE   Final  . Immature Granulocytes 10/20/2019 1  % Final  . Abs Immature Granulocytes 10/20/2019 0.07  0.00 - 0.07 K/uL Final   Performed at [facility], [address] [place], [place] [address]  STUDIES: No results found.  ASSESSMENT:  Chronic refractory ITP  PLAN:   1. Chronic refractory ITP: Patient had a poor response to Prednisone, WinRho, and Rituxan. She is status post splenectomy in 2007.  She received IVIG and Nplate she received while at [facility] with significant improvement of her platelets.  If Nplate no longer was effective, could retry IVIG at a future date.  Patient is currently receiving the max dose of Nplate at 10 mcg/kg.  Can also consider Promacta 12.'5mg'$  or Tavalisse 100 mg BID if needed.  Patient's current platelet count is 21, therefore will proceed with Nplate treatment today.  She continues to only require treatment every 4 weeks.  Return to clinic in 4 weeks for further evaluation and continuation of treatment. 2. Weakness and fatigue: Chronic and unchanged. 3. Depression/dementia: Chronic and unchanged.  Patient is now at [employer]. 4.  Anemia: Resolved.  Hemoglobin is 12.0 today. 5.  Leukocytosis: White blood cell count has trended up to 12.0, monitor.  Patient expressed understanding and was in agreement with this plan. She also understands that She can call clinic at any time with any questions, concerns, or complaints.    Lloyd Huger, MD   10/20/2019 11:26 AM

## 2019-10-19 ENCOUNTER — Other Ambulatory Visit: Payer: Self-pay

## 2019-10-19 DIAGNOSIS — D693 Immune thrombocytopenic purpura: Secondary | ICD-10-CM

## 2019-10-20 ENCOUNTER — Inpatient Hospital Stay: Payer: Medicare HMO | Admitting: Oncology

## 2019-10-20 ENCOUNTER — Encounter: Payer: Self-pay | Admitting: Oncology

## 2019-10-20 ENCOUNTER — Inpatient Hospital Stay: Payer: Medicare HMO

## 2019-10-20 ENCOUNTER — Other Ambulatory Visit: Payer: Self-pay

## 2019-10-20 ENCOUNTER — Inpatient Hospital Stay: Payer: Medicare HMO | Attending: Oncology

## 2019-10-20 VITALS — BP 142/49 | HR 60 | Temp 97.7°F | Resp 17 | Wt 140.0 lb

## 2019-10-20 DIAGNOSIS — D693 Immune thrombocytopenic purpura: Secondary | ICD-10-CM

## 2019-10-20 LAB — CBC WITH DIFFERENTIAL/PLATELET
Abs Immature Granulocytes: 0.07 10*3/uL (ref 0.00–0.07)
Basophils Absolute: 0.1 10*3/uL (ref 0.0–0.1)
Basophils Relative: 1 %
Eosinophils Absolute: 0.3 10*3/uL (ref 0.0–0.5)
Eosinophils Relative: 2 %
HCT: 38.2 % (ref 36.0–46.0)
Hemoglobin: 12 g/dL (ref 12.0–15.0)
Immature Granulocytes: 1 %
Lymphocytes Relative: 32 %
Lymphs Abs: 3.9 10*3/uL (ref 0.7–4.0)
MCH: 29.8 pg (ref 26.0–34.0)
MCHC: 31.4 g/dL (ref 30.0–36.0)
MCV: 94.8 fL (ref 80.0–100.0)
Monocytes Absolute: 1.4 10*3/uL — ABNORMAL HIGH (ref 0.1–1.0)
Monocytes Relative: 12 %
Neutro Abs: 6.2 10*3/uL (ref 1.7–7.7)
Neutrophils Relative %: 52 %
Platelets: 21 10*3/uL — CL (ref 150–400)
RBC: 4.03 MIL/uL (ref 3.87–5.11)
RDW: 18.2 % — ABNORMAL HIGH (ref 11.5–15.5)
WBC: 12 10*3/uL — ABNORMAL HIGH (ref 4.0–10.5)
nRBC: 0 % (ref 0.0–0.2)

## 2019-10-20 MED ORDER — ROMIPLOSTIM INJECTION 500 MCG
10.0000 ug/kg | Freq: Once | SUBCUTANEOUS | Status: AC
  Administered 2019-10-20: 635 ug via SUBCUTANEOUS
  Filled 2019-10-20: qty 1

## 2019-11-06 DIAGNOSIS — M1712 Unilateral primary osteoarthritis, left knee: Secondary | ICD-10-CM | POA: Diagnosis not present

## 2019-11-06 DIAGNOSIS — M25562 Pain in left knee: Secondary | ICD-10-CM | POA: Diagnosis not present

## 2019-11-06 DIAGNOSIS — S83242A Other tear of medial meniscus, current injury, left knee, initial encounter: Secondary | ICD-10-CM | POA: Diagnosis not present

## 2019-11-06 DIAGNOSIS — M25462 Effusion, left knee: Secondary | ICD-10-CM | POA: Diagnosis not present

## 2019-11-10 NOTE — Progress Notes (Signed)
Ariana [name]  [phone]  Fax:(336) [phone]     Ariana [name] DOB: [birth year]  MR#: [id number]  QIO#:[id number]  Patient Care Team: Idelle Crouch, MD as PCP - General (Internal Medicine)  CHIEF COMPLAINT: Chronic refractory ITP  INTERVAL HISTORY: Patient returns to clinic today for repeat laboratory work, further evaluation, and continuation of Nplate.  She is accompanied by her husband today who recently underwent left shoulder replacement surgery.  She has an ecchymosis on her right eye, but is unclear how it occurred.  She otherwise feels well.  She continues to have memory loss and is a poor historian secondary to underlying dementia.  She has chronic weakness and fatigue.  She denies any falls in the interim. She has no neurologic complaints.  She denies any recent fevers or illnesses. She denies any chest pain, shortness of breath, cough, or hemoptysis.  She denies any nausea, vomiting, constipation, or diarrhea.  She has no melena or hematochezia.  She has no urinary complaints.  Patient offers no further specific complaints today.  REVIEW OF SYSTEMS:   Review of Systems  Constitutional: Positive for malaise/fatigue. Negative for fever and weight loss.  HENT: Negative for congestion.   Respiratory: Negative.  Negative for cough, hemoptysis and shortness of breath.   Cardiovascular: Negative.  Negative for chest pain and leg swelling.  Gastrointestinal: Negative.  Negative for abdominal pain, blood in stool, diarrhea, melena and nausea.  Genitourinary: Negative.  Negative for dysuria and hematuria.  Musculoskeletal: Negative.  Negative for back pain and falls.  Skin: Negative.  Negative for rash.  Neurological: Positive for weakness. Negative for dizziness, sensory change, focal weakness and headaches.  Endo/Heme/Allergies: Does not bruise/bleed easily.  Psychiatric/Behavioral: Positive for memory loss. Negative for depression. The patient  is not nervous/anxious.     As per HPI. Otherwise, a complete review of systems is negative.  PAST MEDICAL HISTORY: Past Medical History:  Diagnosis Date  . Alzheimer disease ([place])   . Anemia   . Anxiety   . Asthma   . Chronic back pain   . Depression   . Depression   . GERD (gastroesophageal reflux disease)   . Hiatal hernia   . Hypercholesteremia   . ITP (idiopathic thrombocytopenic purpura)   . Osteoarthritis   . Osteoporosis   . Pulmonary emboli ([place])   . Restless legs     PAST SURGICAL HISTORY: Past Surgical History:  Procedure Laterality Date  . CARPAL TUNNEL RELEASE    . ESOPHAGOGASTRODUODENOSCOPY (EGD) WITH PROPOFOL N/A 09/10/2015   Procedure: ESOPHAGOGASTRODUODENOSCOPY (EGD) WITH PROPOFOL;  Surgeon: Lollie Sails, MD;  Location: [facility] ENDOSCOPY;  Service: Endoscopy;  Laterality: N/A;  . IRRIGATION AND DEBRIDEMENT HEMATOMA Right 07/13/2018   Procedure: IRRIGATION AND DEBRIDEMENT HEMATOMA-RIGHT SHIN;  Surgeon: Herbert Pun, MD;  Location: [facility] ORS;  Service: General;  Laterality: Right;  . KYPHOPLASTY N/A 05/02/2019   Procedure: L1 KYPHOPLASTY;  Surgeon: Hessie Knows, MD;  Location: [facility] ORS;  Service: Orthopedics;  Laterality: N/A;  . LUMBAR [place]    . SPLENECTOMY, PARTIAL    . TOTAL ABDOMINAL HYSTERECTOMY      FAMILY HISTORY Family History  Problem Relation Age of Onset  . Stroke Mother     GYNECOLOGIC HISTORY:  No LMP recorded. Patient is postmenopausal.     ADVANCED DIRECTIVES:    HEALTH MAINTENANCE: Social History   Tobacco Use  . Smoking status: Never Smoker  . Smokeless tobacco: Never Used  Substance Use Topics  . Alcohol  use: No    Alcohol/week: 0.0 standard drinks  . Drug use: No     Allergies  Allergen Reactions  . Aspirin     Upset stomach  . Diazepam Itching    sneezing  . Other     seasonal allergies  . Tetanus Toxoids     Localized superficial swelling of skin  . Morphine Itching and Rash    Current  Outpatient Medications  Medication Sig Dispense Refill  . acetaminophen (TYLENOL) 325 MG tablet Take 2 tablets (650 mg total) by mouth every 6 (six) hours as needed for mild pain (or Fever >/= 101). 100 tablet 0  . albuterol (PROVENTIL HFA;VENTOLIN HFA) 108 (90 BASE) MCG/ACT inhaler Inhale 2 puffs into the lungs every 6 (six) hours as needed for wheezing or shortness of breath.    . ALPRAZolam (XANAX) 0.5 MG tablet Take 1 tablet (0.5 mg total) by mouth 3 (three) times daily as needed for anxiety. 20 tablet 0  . benzonatate (TESSALON PERLES) 100 MG capsule Take 1 capsule (100 mg total) by mouth 3 (three) times daily as needed. 30 capsule 0  . buPROPion (WELLBUTRIN XL) 150 MG 24 hr tablet Take 150 mg by mouth daily.    . busPIRone (BUSPAR) 15 MG tablet Take 15 mg by mouth 2 (two) times daily.     . Calcium Carbonate-Vit D-Min (CALCIUM 600+D PLUS MINERALS) 600-400 MG-UNIT TABS Take 1 tablet by mouth daily.     . Carboxymethylcellul-Glycerin (LUBRICATING EYE DROPS OP) Place 1 drop into both eyes daily as needed (dry eyes).    . cefdinir (OMNICEF) 300 MG capsule Take 1 capsule (300 mg total) by mouth 2 (two) times daily. 5 capsule 0  . cholecalciferol (VITAMIN D3) 25 MCG (1000 UT) tablet Take 1,000 Units by mouth 2 (two) times a day.    . citalopram (CELEXA) 40 MG tablet Take 40 mg by mouth daily.    . Cyanocobalamin (RA VITAMIN B-12 TR) 1000 MCG TBCR Take 1,000 mcg by mouth daily.     . cyclobenzaprine (FLEXERIL) 5 MG tablet Take 5 mg by mouth 3 (three) times daily as needed for muscle spasms.    Marland Kitchen donepezil (ARICEPT) 10 MG tablet Take 10 mg by mouth at bedtime.     . fluticasone (FLONASE) 50 MCG/ACT nasal spray Place 2 sprays into both nostrils daily as needed for allergies.     . Fluticasone-Salmeterol (ADVAIR) 250-50 MCG/DOSE AEPB Inhale 1 puff into the lungs daily as needed (shortness of breath).     . gabapentin (NEURONTIN) 300 MG capsule Take 300 mg by mouth 4 (four) times daily.    Marland Kitchen  HYDROcodone-acetaminophen (NORCO) 5-325 MG tablet Take 1 tablet by mouth every 6 (six) hours as needed for moderate pain. 30 tablet 0  . levothyroxine (SYNTHROID, LEVOTHROID) 50 MCG tablet Take 50 mcg by mouth daily before breakfast. Take 30 to 60 minutes before breakfast.    . memantine (NAMENDA) 10 MG tablet Take 10 mg by mouth daily.     . montelukast (SINGULAIR) 10 MG tablet Take 10 mg by mouth at bedtime.     . Multiple Vitamin (MULTI-VITAMIN) tablet Take 1 tablet by mouth daily.     . Multiple Vitamins-Minerals (PRESERVISION AREDS 2) CAPS Take 1 capsule by mouth 2 (two) times a day.    Marland Kitchen NARCAN 4 MG/0.1ML LIQD nasal spray kit Place 1 spray into the nose once.     . nystatin (MYCOSTATIN) 100000 UNIT/ML suspension Take 5 mLs (500,000 Units total)  by mouth 4 (four) times daily. 140 mL 0  . oxybutynin (DITROPAN) 5 MG tablet Take 5 mg by mouth 3 (three) times daily.    . pantoprazole (PROTONIX) 20 MG tablet Take 20 mg by mouth 2 (two) times daily.     . pravastatin (PRAVACHOL) 20 MG tablet Take 20 mg by mouth daily.    . ranitidine (ZANTAC) 300 MG tablet Take 300 mg by mouth at bedtime.     . traMADol (ULTRAM) 50 MG tablet Take 50 mg by mouth 2 (two) times daily.   5  . vitamin C (ASCORBIC ACID) 500 MG tablet Take 500 mg by mouth daily.    . vitamin E (E-400) 400 UNIT capsule Take 400 Units by mouth daily.     No current facility-administered medications for this visit.    OBJECTIVE: BP (!) 145/78 (BP Location: Left Arm, Patient Position: Sitting)   Pulse 60   Temp (!) 97.1 F (36.2 C) (Tympanic)   Resp 18   Wt 134 lb 6.4 oz (61 kg)   SpO2 100%   BMI 21.69 kg/m    Body mass index is 21.69 kg/m.    ECOG FS:1 - Symptomatic but completely ambulatory  General: Well-developed, well-nourished, no acute distress. Eyes: Pink conjunctiva, anicteric sclera. HEENT: Normocephalic, moist mucous membranes.  Small ecchymosis on right eye. Lungs: No audible wheezing or coughing. Heart: Regular  rate and rhythm. Abdomen: Soft, nontender, no obvious distention. Musculoskeletal: No edema, cyanosis, or clubbing. Neuro: Alert. Cranial nerves grossly intact. Skin: No rashes or petechiae noted. Psych: Normal affect.   LAB RESULTS:  Appointment on 11/17/2019  Component Date Value Ref Range Status  . WBC 11/17/2019 13.2* 4.0 - 10.5 K/uL Final  . RBC 11/17/2019 4.25  3.87 - 5.11 MIL/uL Final  . Hemoglobin 11/17/2019 12.5  12.0 - 15.0 g/dL Final  . HCT 11/17/2019 39.4  36.0 - 46.0 % Final  . MCV 11/17/2019 92.7  80.0 - 100.0 fL Final  . MCH 11/17/2019 29.4  26.0 - 34.0 pg Final  . MCHC 11/17/2019 31.7  30.0 - 36.0 g/dL Final  . RDW 11/17/2019 17.1* 11.5 - 15.5 % Final  . Platelets 11/17/2019 22* 150 - 400 K/uL Final   Comment: REPEATED TO VERIFY PLATELET COUNT CONFIRMED BY SMEAR Immature Platelet Fraction may be clinically indicated, consider ordering this additional test [id number] THIS CRITICAL RESULT HAS VERIFIED AND BEEN CALLED TO DR. Martinez ON 02 05 2021 AT 6160, AND HAS BEEN READ BACK. '@10'$ :18AM BY [facility]   . nRBC 11/17/2019 0.0  0.0 - 0.2 % Final  . Neutrophils Relative % 11/17/2019 61  % Final  . Neutro Abs 11/17/2019 8.3* 1.7 - 7.7 K/uL Final  . Lymphocytes Relative 11/17/2019 25  % Final  . Lymphs Abs 11/17/2019 3.2  0.7 - 4.0 K/uL Final  . Monocytes Relative 11/17/2019 10  % Final  . Monocytes Absolute 11/17/2019 1.3* 0.1 - 1.0 K/uL Final  . Eosinophils Relative 11/17/2019 2  % Final  . Eosinophils Absolute 11/17/2019 0.2  0.0 - 0.5 K/uL Final  . Basophils Relative 11/17/2019 1  % Final  . Basophils Absolute 11/17/2019 0.1  0.0 - 0.1 K/uL Final  . WBC Morphology 11/17/2019 MORPHOLOGY UNREMARKABLE   Final  . RBC Morphology 11/17/2019 MORPHOLOGY UNREMARKABLE   Final  . Smear Review 11/17/2019 MORPHOLOGY UNREMARKABLE   Final  . Immature Granulocytes 11/17/2019 1  % Final  . Abs Immature Granulocytes 11/17/2019 0.08* 0.00 - 0.07 K/uL Final  .  [facility] PLTs  11/17/2019 PRESENT   Final   Performed at [facility], [address] [place], [place] [address]    STUDIES: No results found.  ASSESSMENT:  Chronic refractory ITP  PLAN:   1. Chronic refractory ITP: Patient had a poor response to Prednisone, WinRho, and Rituxan. She is status post splenectomy in 2007.  She received IVIG and Nplate she received while at [facility] with significant improvement of her platelets.  If Nplate no longer was effective, could retry IVIG at a future date.  Patient is currently receiving the max dose of Nplate at 10 mcg/kg.  Can also consider Promacta 12.'5mg'$  or Tavalisse 100 mg BID if needed.  Patient's platelet count is 22 today, therefore proceed with Nplate.  She continues to only require treatment every 4 weeks.  Return to clinic in 4 weeks for further evaluation and continuation of treatment. 2. Weakness and fatigue: Chronic and unchanged. 3. Depression/dementia: Chronic and unchanged.  Patient is now at [employer]. 4.  Anemia: Resolved.  Hemoglobin is 12.5 today. 5.  Leukocytosis: White blood cell count has trended up to 13.2.  Monitor.    Patient expressed understanding and was in agreement with this plan. She also understands that She can call clinic at any time with any questions, concerns, or complaints.    Lloyd Huger, MD   11/17/2019 2:46 PM

## 2019-11-16 ENCOUNTER — Other Ambulatory Visit: Payer: Self-pay

## 2019-11-16 ENCOUNTER — Other Ambulatory Visit: Payer: Self-pay | Admitting: Emergency Medicine

## 2019-11-16 DIAGNOSIS — D693 Immune thrombocytopenic purpura: Secondary | ICD-10-CM

## 2019-11-16 NOTE — Progress Notes (Signed)
Patient pre screened for office appointment, no questions or concerns today. Patient reminded of upcoming appointment time and date. 

## 2019-11-17 ENCOUNTER — Inpatient Hospital Stay (HOSPITAL_BASED_OUTPATIENT_CLINIC_OR_DEPARTMENT_OTHER): Payer: Medicare HMO | Admitting: Oncology

## 2019-11-17 ENCOUNTER — Inpatient Hospital Stay: Payer: Medicare HMO | Attending: Oncology

## 2019-11-17 ENCOUNTER — Inpatient Hospital Stay: Payer: Medicare HMO

## 2019-11-17 ENCOUNTER — Other Ambulatory Visit: Payer: Self-pay

## 2019-11-17 VITALS — BP 145/78 | HR 60 | Temp 97.1°F | Resp 18 | Wt 134.4 lb

## 2019-11-17 DIAGNOSIS — D693 Immune thrombocytopenic purpura: Secondary | ICD-10-CM

## 2019-11-17 LAB — CBC WITH DIFFERENTIAL/PLATELET
Abs Immature Granulocytes: 0.08 10*3/uL — ABNORMAL HIGH (ref 0.00–0.07)
Basophils Absolute: 0.1 10*3/uL (ref 0.0–0.1)
Basophils Relative: 1 %
Eosinophils Absolute: 0.2 10*3/uL (ref 0.0–0.5)
Eosinophils Relative: 2 %
HCT: 39.4 % (ref 36.0–46.0)
Hemoglobin: 12.5 g/dL (ref 12.0–15.0)
Immature Granulocytes: 1 %
Lymphocytes Relative: 25 %
Lymphs Abs: 3.2 10*3/uL (ref 0.7–4.0)
MCH: 29.4 pg (ref 26.0–34.0)
MCHC: 31.7 g/dL (ref 30.0–36.0)
MCV: 92.7 fL (ref 80.0–100.0)
Monocytes Absolute: 1.3 10*3/uL — ABNORMAL HIGH (ref 0.1–1.0)
Monocytes Relative: 10 %
Neutro Abs: 8.3 10*3/uL — ABNORMAL HIGH (ref 1.7–7.7)
Neutrophils Relative %: 61 %
Platelets: 22 10*3/uL — CL (ref 150–400)
RBC: 4.25 MIL/uL (ref 3.87–5.11)
RDW: 17.1 % — ABNORMAL HIGH (ref 11.5–15.5)
WBC: 13.2 10*3/uL — ABNORMAL HIGH (ref 4.0–10.5)
nRBC: 0 % (ref 0.0–0.2)

## 2019-11-17 MED ORDER — ROMIPLOSTIM INJECTION 500 MCG
10.0000 ug/kg | Freq: Once | SUBCUTANEOUS | Status: AC
  Administered 2019-11-17: 11:00:00 610 ug via SUBCUTANEOUS
  Filled 2019-11-17: qty 1

## 2019-12-09 NOTE — Progress Notes (Signed)
Ariana [name]  Telephone:(336) [phone]  Fax:(336) [phone]     Ariana [name] DOB: [birth year]  MR#: [id number]  HRC#:[id number]  Patient Care Team: Idelle Crouch, MD as PCP - General (Internal Medicine)  CHIEF COMPLAINT: Chronic refractory ITP  INTERVAL HISTORY: Patient returns to clinic today for repeat laboratory work, further evaluation, and continuation of Nplate.  She has had increased weakness and fatigue over the past week but attributes this to chest congestion and recently being placed on an antibiotic.  She otherwise has felt well.  She continues to have memory loss and is a poor historian secondary to underlying dementia. She denies any falls in the interim. She has no neurologic complaints.  She denies any fevers.  She denies any chest pain, shortness of breath, cough, or hemoptysis.  She denies any nausea, vomiting, constipation, or diarrhea.  She has no melena or hematochezia.  She has no urinary complaints.  Patient offers no further specific complaints today.  REVIEW OF SYSTEMS:   Review of Systems  Constitutional: Positive for malaise/fatigue. Negative for fever and weight loss.  HENT: Positive for congestion.   Respiratory: Negative.  Negative for cough, hemoptysis and shortness of breath.   Cardiovascular: Negative.  Negative for chest pain and leg swelling.  Gastrointestinal: Negative.  Negative for abdominal pain, blood in stool, diarrhea, melena and nausea.  Genitourinary: Negative.  Negative for dysuria and hematuria.  Musculoskeletal: Negative.  Negative for back pain and falls.  Skin: Negative.  Negative for rash.  Neurological: Positive for weakness. Negative for dizziness, sensory change, focal weakness and headaches.  Endo/Heme/Allergies: Does not bruise/bleed easily.  Psychiatric/Behavioral: Positive for memory loss. Negative for depression. The patient is not nervous/anxious.     As per HPI. Otherwise, a complete review of systems  is negative.  PAST MEDICAL HISTORY: Past Medical History:  Diagnosis Date  . Alzheimer disease ([place])   . Anemia   . Anxiety   . Asthma   . Chronic back pain   . Depression   . Depression   . GERD (gastroesophageal reflux disease)   . Hiatal hernia   . Hypercholesteremia   . ITP (idiopathic thrombocytopenic purpura)   . Osteoarthritis   . Osteoporosis   . Pulmonary emboli ([place])   . Restless legs     PAST SURGICAL HISTORY: Past Surgical History:  Procedure Laterality Date  . CARPAL TUNNEL RELEASE    . ESOPHAGOGASTRODUODENOSCOPY (EGD) WITH PROPOFOL N/A 09/10/2015   Procedure: ESOPHAGOGASTRODUODENOSCOPY (EGD) WITH PROPOFOL;  Surgeon: Lollie Sails, MD;  Location: [facility] ENDOSCOPY;  Service: Endoscopy;  Laterality: N/A;  . IRRIGATION AND DEBRIDEMENT HEMATOMA Right 07/13/2018   Procedure: IRRIGATION AND DEBRIDEMENT HEMATOMA-RIGHT SHIN;  Surgeon: Herbert Pun, MD;  Location: [facility] ORS;  Service: General;  Laterality: Right;  . KYPHOPLASTY N/A 05/02/2019   Procedure: L1 KYPHOPLASTY;  Surgeon: Hessie Knows, MD;  Location: [facility] ORS;  Service: Orthopedics;  Laterality: N/A;  . LUMBAR [place]    . SPLENECTOMY, PARTIAL    . TOTAL ABDOMINAL HYSTERECTOMY      FAMILY HISTORY Family History  Problem Relation Age of Onset  . Stroke Mother     GYNECOLOGIC HISTORY:  No LMP recorded. Patient is postmenopausal.     ADVANCED DIRECTIVES:    HEALTH MAINTENANCE: Social History   Tobacco Use  . Smoking status: Never Smoker  . Smokeless tobacco: Never Used  Substance Use Topics  . Alcohol use: No    Alcohol/week: 0.0 standard drinks  . Drug use: No  Allergies  Allergen Reactions  . Aspirin     Upset stomach  . Diazepam Itching    sneezing  . Other     seasonal allergies  . Tetanus Toxoids     Localized superficial swelling of skin  . Morphine Itching and Rash    Current Outpatient Medications  Medication Sig Dispense Refill  . acetaminophen (TYLENOL)  325 MG tablet Take 2 tablets (650 mg total) by mouth every 6 (six) hours as needed for mild pain (or Fever >/= 101). 100 tablet 0  . albuterol (PROVENTIL HFA;VENTOLIN HFA) 108 (90 BASE) MCG/ACT inhaler Inhale 2 puffs into the lungs every 6 (six) hours as needed for wheezing or shortness of breath.    . ALPRAZolam (XANAX) 0.5 MG tablet Take 1 tablet (0.5 mg total) by mouth 3 (three) times daily as needed for anxiety. 20 tablet 0  . amoxicillin-clavulanate (AUGMENTIN) 500-125 MG tablet     . benzonatate (TESSALON) 200 MG capsule     . buPROPion (WELLBUTRIN XL) 150 MG 24 hr tablet Take 150 mg by mouth daily.    . busPIRone (BUSPAR) 15 MG tablet Take 15 mg by mouth 2 (two) times daily.     . Calcium Carbonate-Vit D-Min (CALCIUM 600+D PLUS MINERALS) 600-400 MG-UNIT TABS Take 1 tablet by mouth daily.     . Carboxymethylcellul-Glycerin (LUBRICATING EYE DROPS OP) Place 1 drop into both eyes daily as needed (dry eyes).    . cholecalciferol (VITAMIN D3) 25 MCG (1000 UT) tablet Take 1,000 Units by mouth 2 (two) times a day.    . citalopram (CELEXA) 40 MG tablet Take 40 mg by mouth daily.    . Cyanocobalamin (RA VITAMIN B-12 TR) 1000 MCG TBCR Take 1,000 mcg by mouth daily.     . cyclobenzaprine (FLEXERIL) 5 MG tablet Take 5 mg by mouth 3 (three) times daily as needed for muscle spasms.    Marland Kitchen donepezil (ARICEPT) 10 MG tablet Take 10 mg by mouth at bedtime.     . fluticasone (FLONASE) 50 MCG/ACT nasal spray Place 2 sprays into both nostrils daily as needed for allergies.     . Fluticasone-Salmeterol (ADVAIR) 250-50 MCG/DOSE AEPB Inhale 1 puff into the lungs daily as needed (shortness of breath).     . gabapentin (NEURONTIN) 300 MG capsule Take 300 mg by mouth 4 (four) times daily.    Marland Kitchen levothyroxine (SYNTHROID, LEVOTHROID) 50 MCG tablet Take 50 mcg by mouth daily before breakfast. Take 30 to 60 minutes before breakfast.    . memantine (NAMENDA) 10 MG tablet Take 10 mg by mouth daily.     . methylPREDNISolone  (MEDROL DOSEPAK) 4 MG TBPK tablet     . montelukast (SINGULAIR) 10 MG tablet Take 10 mg by mouth at bedtime.     . Multiple Vitamin (MULTI-VITAMIN) tablet Take 1 tablet by mouth daily.     . Multiple Vitamins-Minerals (PRESERVISION AREDS 2) CAPS Take 1 capsule by mouth 2 (two) times a day.    Marland Kitchen NARCAN 4 MG/0.1ML LIQD nasal spray kit Place 1 spray into the nose once.     . nystatin (MYCOSTATIN) 100000 UNIT/ML suspension Take 5 mLs (500,000 Units total) by mouth 4 (four) times daily. 140 mL 0  . oxybutynin (DITROPAN) 5 MG tablet Take 5 mg by mouth 3 (three) times daily.    Marland Kitchen oxyCODONE-acetaminophen (PERCOCET) 10-325 MG tablet Take 1 tablet by mouth 4 (four) times daily.    . pantoprazole (PROTONIX) 20 MG tablet Take 20 mg by mouth  2 (two) times daily.     . pravastatin (PRAVACHOL) 20 MG tablet Take 20 mg by mouth daily.    . vitamin C (ASCORBIC ACID) 500 MG tablet Take 500 mg by mouth daily.    . vitamin E (E-400) 400 UNIT capsule Take 400 Units by mouth daily.     No current facility-administered medications for this visit.    OBJECTIVE: BP (!) 160/78 (BP Location: Left Arm, Patient Position: Sitting)   Pulse 69   Temp 97.8 F (36.6 C) (Tympanic)   Resp 18   Wt 136 lb 8 oz (61.9 kg)   SpO2 99%   BMI 22.03 kg/m    Body mass index is 22.03 kg/m.    ECOG FS:1 - Symptomatic but completely ambulatory  General: Well-developed, well-nourished, no acute distress.  Sitting in a wheelchair Eyes: Pink conjunctiva, anicteric sclera. HEENT: Normocephalic, moist mucous membranes. Lungs: No audible wheezing or coughing. Heart: Regular rate and rhythm. Abdomen: Soft, nontender, no obvious distention. Musculoskeletal: No edema, cyanosis, or clubbing. Neuro: Alert, answering all questions appropriately. Cranial nerves grossly intact. Skin: No rashes or petechiae noted. Psych: Normal affect.   LAB RESULTS:  Appointment on 12/15/2019  Component Date Value Ref Range Status  . WBC 12/15/2019  18.7* 4.0 - 10.5 K/uL Final  . RBC 12/15/2019 3.70* 3.87 - 5.11 MIL/uL Final  . Hemoglobin 12/15/2019 11.1* 12.0 - 15.0 g/dL Final  . HCT 12/15/2019 34.2* 36.0 - 46.0 % Final  . MCV 12/15/2019 92.4  80.0 - 100.0 fL Final  . MCH 12/15/2019 30.0  26.0 - 34.0 pg Final  . MCHC 12/15/2019 32.5  30.0 - 36.0 g/dL Final  . RDW 12/15/2019 17.0* 11.5 - 15.5 % Final  . Platelets 12/15/2019 15* 150 - 400 K/uL Final   Comment: Immature Platelet Fraction may be clinically indicated, consider ordering this additional test [id number] THIS CRITICAL RESULT HAS VERIFIED AND BEEN CALLED TO SARAH OLEJAR BY LONNIE RHONE ON 03 05 2021 AT 1055, AND HAS BEEN READ BACK. _0 :54AM BY [facility]   . nRBC 12/15/2019 0.1  0.0 - 0.2 % Final  . Neutrophils Relative % 12/15/2019 80  % Final  . Neutro Abs 12/15/2019 15.0* 1.7 - 7.7 K/uL Final  . Lymphocytes Relative 12/15/2019 10  % Final  . Lymphs Abs 12/15/2019 1.9  0.7 - 4.0 K/uL Final  . Monocytes Relative 12/15/2019 7  % Final  . Monocytes Absolute 12/15/2019 1.3* 0.1 - 1.0 K/uL Final  . Eosinophils Relative 12/15/2019 1  % Final  . Eosinophils Absolute 12/15/2019 0.1  0.0 - 0.5 K/uL Final  . Basophils Relative 12/15/2019 0  % Final  . Basophils Absolute 12/15/2019 0.1  0.0 - 0.1 K/uL Final  . Smear Review 12/15/2019 PLATELETS APPEAR DECREASED   Final   Comment: PLATELET COUNT CONFIRMED BY SMEAR Reviewed   . Immature Granulocytes 12/15/2019 2  % Final  . Abs Immature Granulocytes 12/15/2019 0.28* 0.00 - 0.07 K/uL Final   Performed at [facility], [address] [place], [place] [address]    STUDIES: No results found.  ASSESSMENT:  Chronic refractory ITP  PLAN:   1. Chronic refractory ITP: Patient had a poor response to Prednisone, WinRho, and Rituxan. She is status post splenectomy in 2007.  She received IVIG and Nplate she received while at [facility] with significant improvement of her platelets.  If Nplate no longer was effective, could retry IVIG at a future  date.  Patient is currently receiving the max dose of Nplate  at 10 mcg/kg.  Can also consider Promacta 12.93m or Tavalisse 100 mg BID if needed.  Patient's platelet count is 15 today, therefore will proceed with treatment.  She continues to only require treatment every 4 weeks.  Return to clinic in 4 weeks for repeat laboratory work and further evaluation.  2. Weakness and fatigue: Chronic and unchanged. 3. Depression/dementia: Chronic and unchanged.  Patient is now at [employer].  Anemia: Hemoglobin has trended down slightly and is 11.1 today. 5.  Leukocytosis: White blood cell count continues to trend up and is now 18.7, but this may be attributed to her underlying chest congestion. 6.  Congestion: Continue antibiotics as prescribed.  Patient expressed understanding and was in agreement with this plan. She also understands that She can call clinic at any time with any questions, concerns, or complaints.    TLloyd Huger MD   12/17/2019 6:53 AM

## 2019-12-11 DIAGNOSIS — I10 Essential (primary) hypertension: Secondary | ICD-10-CM | POA: Diagnosis not present

## 2019-12-11 DIAGNOSIS — Z79899 Other long term (current) drug therapy: Secondary | ICD-10-CM | POA: Diagnosis not present

## 2019-12-11 DIAGNOSIS — R0600 Dyspnea, unspecified: Secondary | ICD-10-CM | POA: Diagnosis not present

## 2019-12-11 DIAGNOSIS — G894 Chronic pain syndrome: Secondary | ICD-10-CM | POA: Diagnosis not present

## 2019-12-11 DIAGNOSIS — E7800 Pure hypercholesterolemia, unspecified: Secondary | ICD-10-CM | POA: Diagnosis not present

## 2019-12-11 DIAGNOSIS — J4521 Mild intermittent asthma with (acute) exacerbation: Secondary | ICD-10-CM | POA: Diagnosis not present

## 2019-12-11 DIAGNOSIS — E039 Hypothyroidism, unspecified: Secondary | ICD-10-CM | POA: Diagnosis not present

## 2019-12-14 ENCOUNTER — Encounter: Payer: Self-pay | Admitting: Oncology

## 2019-12-14 NOTE — Progress Notes (Signed)
Patient prescreened for appointment. Patient has no concerns or questions.  

## 2019-12-15 ENCOUNTER — Inpatient Hospital Stay: Payer: Medicare HMO | Attending: Oncology | Admitting: Oncology

## 2019-12-15 ENCOUNTER — Inpatient Hospital Stay: Payer: Medicare HMO

## 2019-12-15 ENCOUNTER — Other Ambulatory Visit: Payer: Self-pay

## 2019-12-15 ENCOUNTER — Inpatient Hospital Stay: Payer: Medicare HMO | Attending: Oncology

## 2019-12-15 VITALS — BP 160/78 | HR 69 | Temp 97.8°F | Resp 18 | Wt 136.5 lb

## 2019-12-15 DIAGNOSIS — D693 Immune thrombocytopenic purpura: Secondary | ICD-10-CM | POA: Insufficient documentation

## 2019-12-15 LAB — CBC WITH DIFFERENTIAL/PLATELET
Abs Immature Granulocytes: 0.28 10*3/uL — ABNORMAL HIGH (ref 0.00–0.07)
Basophils Absolute: 0.1 10*3/uL (ref 0.0–0.1)
Basophils Relative: 0 %
Eosinophils Absolute: 0.1 10*3/uL (ref 0.0–0.5)
Eosinophils Relative: 1 %
HCT: 34.2 % — ABNORMAL LOW (ref 36.0–46.0)
Hemoglobin: 11.1 g/dL — ABNORMAL LOW (ref 12.0–15.0)
Immature Granulocytes: 2 %
Lymphocytes Relative: 10 %
Lymphs Abs: 1.9 10*3/uL (ref 0.7–4.0)
MCH: 30 pg (ref 26.0–34.0)
MCHC: 32.5 g/dL (ref 30.0–36.0)
MCV: 92.4 fL (ref 80.0–100.0)
Monocytes Absolute: 1.3 10*3/uL — ABNORMAL HIGH (ref 0.1–1.0)
Monocytes Relative: 7 %
Neutro Abs: 15 10*3/uL — ABNORMAL HIGH (ref 1.7–7.7)
Neutrophils Relative %: 80 %
Platelets: 15 10*3/uL — CL (ref 150–400)
RBC: 3.7 MIL/uL — ABNORMAL LOW (ref 3.87–5.11)
RDW: 17 % — ABNORMAL HIGH (ref 11.5–15.5)
Smear Review: DECREASED
WBC: 18.7 10*3/uL — ABNORMAL HIGH (ref 4.0–10.5)
nRBC: 0.1 % (ref 0.0–0.2)

## 2019-12-15 MED ORDER — ROMIPLOSTIM INJECTION 500 MCG
10.0000 ug/kg | Freq: Once | SUBCUTANEOUS | Status: AC
  Administered 2019-12-15: 620 ug via SUBCUTANEOUS
  Filled 2019-12-15: qty 1.24

## 2019-12-15 NOTE — Progress Notes (Signed)
Pt in for follow up, pt currently on antibiotic, pred pack for bronchitis.

## 2020-01-03 DIAGNOSIS — R399 Unspecified symptoms and signs involving the genitourinary system: Secondary | ICD-10-CM | POA: Diagnosis not present

## 2020-01-03 DIAGNOSIS — R7989 Other specified abnormal findings of blood chemistry: Secondary | ICD-10-CM | POA: Diagnosis not present

## 2020-01-06 NOTE — Progress Notes (Signed)
[place]  [phone]) [phone]  Fax:(336) [phone]     Ariana [name] DOB: 04-28-1933  MR#: [id number]  SEG#:[id number]  Patient Care Team: Idelle Crouch, MD as PCP - General (Internal Medicine)  CHIEF COMPLAINT: Chronic refractory ITP  INTERVAL HISTORY: Patient returns to clinic today for repeat laboratory work, further evaluation, and continuation of Nplate.  She has noticed increased weakness and fatigue over the past several weeks, but otherwise feels well.  She continues to have memory loss and is a poor historian secondary to underlying dementia. She denies any falls in the interim. She has no neurologic complaints.  She denies any fevers.  She denies any chest pain, shortness of breath, cough, or hemoptysis.  She denies any nausea, vomiting, constipation, or diarrhea.  She has no melena or hematochezia.  She has no urinary complaints.  Patient offers no further specific complaints today.  REVIEW OF SYSTEMS:   Review of Systems  Constitutional: Positive for malaise/fatigue. Negative for fever and weight loss.  HENT: Negative for congestion.   Respiratory: Negative.  Negative for cough, hemoptysis and shortness of breath.   Cardiovascular: Negative.  Negative for chest pain and leg swelling.  Gastrointestinal: Negative.  Negative for abdominal pain, blood in stool, diarrhea, melena and nausea.  Genitourinary: Negative.  Negative for dysuria and hematuria.  Musculoskeletal: Negative.  Negative for back pain and falls.  Skin: Negative.  Negative for rash.  Neurological: Positive for weakness. Negative for dizziness, sensory change, focal weakness and headaches.  Endo/Heme/Allergies: Does not bruise/bleed easily.  Psychiatric/Behavioral: Positive for memory loss. Negative for depression. The patient is not nervous/anxious.     As per HPI. Otherwise, a complete review of systems is negative.  PAST MEDICAL HISTORY: Past Medical History:  Diagnosis  Date  . Alzheimer disease ([place])   . Anemia   . Anxiety   . Asthma   . Chronic back pain   . Depression   . Depression   . GERD (gastroesophageal reflux disease)   . Hiatal hernia   . Hypercholesteremia   . ITP (idiopathic thrombocytopenic purpura)   . Osteoarthritis   . Osteoporosis   . Pulmonary emboli ([place])   . Restless legs     PAST SURGICAL HISTORY: Past Surgical History:  Procedure Laterality Date  . CARPAL TUNNEL RELEASE    . ESOPHAGOGASTRODUODENOSCOPY (EGD) WITH PROPOFOL N/A 09/10/2015   Procedure: ESOPHAGOGASTRODUODENOSCOPY (EGD) WITH PROPOFOL;  Surgeon: Lollie Sails, MD;  Location: [facility] ENDOSCOPY;  Service: Endoscopy;  Laterality: N/A;  . IRRIGATION AND DEBRIDEMENT HEMATOMA Right 07/13/2018   Procedure: IRRIGATION AND DEBRIDEMENT HEMATOMA-RIGHT SHIN;  Surgeon: Herbert Pun, MD;  Location: [facility] ORS;  Service: General;  Laterality: Right;  . KYPHOPLASTY N/A 05/02/2019   Procedure: L1 KYPHOPLASTY;  Surgeon: Hessie Knows, MD;  Location: [facility] ORS;  Service: Orthopedics;  Laterality: N/A;  . LUMBAR [place]    . SPLENECTOMY, PARTIAL    . TOTAL ABDOMINAL HYSTERECTOMY      FAMILY HISTORY Family History  Problem Relation Age of Onset  . Stroke Mother     GYNECOLOGIC HISTORY:  No LMP recorded. Patient is postmenopausal.     ADVANCED DIRECTIVES:    HEALTH MAINTENANCE: Social History   Tobacco Use  . Smoking status: Never Smoker  . Smokeless tobacco: Never Used  Substance Use Topics  . Alcohol use: No    Alcohol/week: 0.0 standard drinks  . Drug use: No     Allergies  Allergen Reactions  . Aspirin  Upset stomach  . Diazepam Itching    sneezing  . Other     seasonal allergies  . Tetanus Toxoids     Localized superficial swelling of skin  . Morphine Itching and Rash    Current Outpatient Medications  Medication Sig Dispense Refill  . acetaminophen (TYLENOL) 325 MG tablet Take 2 tablets (650 mg total) by mouth every 6 (six) hours  as needed for mild pain (or Fever >/= 101). 100 tablet 0  . albuterol (PROVENTIL HFA;VENTOLIN HFA) 108 (90 BASE) MCG/ACT inhaler Inhale 2 puffs into the lungs every 6 (six) hours as needed for wheezing or shortness of breath.    . ALPRAZolam (XANAX) 0.5 MG tablet Take 1 tablet (0.5 mg total) by mouth 3 (three) times daily as needed for anxiety. 20 tablet 0  . amoxicillin-clavulanate (AUGMENTIN) 500-125 MG tablet     . benzonatate (TESSALON) 200 MG capsule     . buPROPion (WELLBUTRIN XL) 150 MG 24 hr tablet Take 150 mg by mouth daily.    . busPIRone (BUSPAR) 15 MG tablet Take 15 mg by mouth 2 (two) times daily.     . Calcium Carbonate-Vit D-Min (CALCIUM 600+D PLUS MINERALS) 600-400 MG-UNIT TABS Take 1 tablet by mouth daily.     . Carboxymethylcellul-Glycerin (LUBRICATING EYE DROPS OP) Place 1 drop into both eyes daily as needed (dry eyes).    . cholecalciferol (VITAMIN D3) 25 MCG (1000 UT) tablet Take 1,000 Units by mouth 2 (two) times a day.    . citalopram (CELEXA) 40 MG tablet Take 40 mg by mouth daily.    . Cyanocobalamin (RA VITAMIN B-12 TR) 1000 MCG TBCR Take 1,000 mcg by mouth daily.     . cyclobenzaprine (FLEXERIL) 5 MG tablet Take 5 mg by mouth 3 (three) times daily as needed for muscle spasms.    . fluticasone (FLONASE) 50 MCG/ACT nasal spray Place 2 sprays into both nostrils daily as needed for allergies.     . Fluticasone-Salmeterol (ADVAIR) 250-50 MCG/DOSE AEPB Inhale 1 puff into the lungs daily as needed (shortness of breath).     . gabapentin (NEURONTIN) 300 MG capsule Take 300 mg by mouth 4 (four) times daily.    Marland Kitchen levothyroxine (SYNTHROID, LEVOTHROID) 50 MCG tablet Take 50 mcg by mouth daily before breakfast. Take 30 to 60 minutes before breakfast.    . methylPREDNISolone (MEDROL DOSEPAK) 4 MG TBPK tablet     . montelukast (SINGULAIR) 10 MG tablet Take 10 mg by mouth at bedtime.     . Multiple Vitamin (MULTI-VITAMIN) tablet Take 1 tablet by mouth daily.     . Multiple  Vitamins-Minerals (PRESERVISION AREDS 2) CAPS Take 1 capsule by mouth 2 (two) times a day.    Marland Kitchen NARCAN 4 MG/0.1ML LIQD nasal spray kit Place 1 spray into the nose once.     . nystatin (MYCOSTATIN) 100000 UNIT/ML suspension Take 5 mLs (500,000 Units total) by mouth 4 (four) times daily. 140 mL 0  . oxybutynin (DITROPAN) 5 MG tablet Take 5 mg by mouth 3 (three) times daily.    . pantoprazole (PROTONIX) 20 MG tablet Take 20 mg by mouth 2 (two) times daily.     . pravastatin (PRAVACHOL) 20 MG tablet Take 20 mg by mouth daily.    . vitamin C (ASCORBIC ACID) 500 MG tablet Take 500 mg by mouth daily.    . vitamin E (E-400) 400 UNIT capsule Take 400 Units by mouth daily.    Marland Kitchen donepezil (ARICEPT) 10 MG tablet  Take 10 mg by mouth at bedtime.     . memantine (NAMENDA) 10 MG tablet Take 10 mg by mouth daily.      No current facility-administered medications for this visit.    OBJECTIVE: BP 132/65 (BP Location: Left Arm, Patient Position: Sitting)   Pulse 88   Temp (!) 96.9 F (36.1 C) (Tympanic)   Resp 18   Wt 132 lb 8 oz (60.1 kg)   BMI 21.39 kg/m    Body mass index is 21.39 kg/m.    ECOG FS:1 - Symptomatic but completely ambulatory  General: Well-developed, well-nourished, no acute distress. Eyes: Pink conjunctiva, anicteric sclera. HEENT: Normocephalic, moist mucous membranes. Lungs: No audible wheezing or coughing. Heart: Regular rate and rhythm. Abdomen: Soft, nontender, no obvious distention. Musculoskeletal: No edema, cyanosis, or clubbing. Neuro: Alert, answering all questions appropriately. Cranial nerves grossly intact. Skin: No rashes or petechiae noted. Psych: Normal affect.   LAB RESULTS:  Appointment on 01/12/2020  Component Date Value Ref Range Status  . WBC 01/12/2020 11.7* 4.0 - 10.5 K/uL Final  . RBC 01/12/2020 4.46  3.87 - 5.11 MIL/uL Final  . Hemoglobin 01/12/2020 13.3  12.0 - 15.0 g/dL Final  . HCT 01/12/2020 40.6  36.0 - 46.0 % Final  . MCV 01/12/2020 91.0  80.0  - 100.0 fL Final  . MCH 01/12/2020 29.8  26.0 - 34.0 pg Final  . MCHC 01/12/2020 32.8  30.0 - 36.0 g/dL Final  . RDW 01/12/2020 17.6* 11.5 - 15.5 % Final  . Platelets 01/12/2020 24* 150 - 400 K/uL Final   Comment: REPEATED TO VERIFY Immature Platelet Fraction may be clinically indicated, consider ordering this additional test [id number] THIS CRITICAL RESULT HAS VERIFIED AND BEEN CALLED TO DR. Jeanpierre Thebeau BY LONNIE RHONE ON 04 02 2021 AT 1101, AND HAS BEEN READ BACK. @ 10:40 AM   . nRBC 01/12/2020 0.0  0.0 - 0.2 % Final  . Neutrophils Relative % 01/12/2020 55  % Final  . Neutro Abs 01/12/2020 6.4  1.7 - 7.7 K/uL Final  . Lymphocytes Relative 01/12/2020 29  % Final  . Lymphs Abs 01/12/2020 3.4  0.7 - 4.0 K/uL Final  . Monocytes Relative 01/12/2020 11  % Final  . Monocytes Absolute 01/12/2020 1.3* 0.1 - 1.0 K/uL Final  . Eosinophils Relative 01/12/2020 4  % Final  . Eosinophils Absolute 01/12/2020 0.4  0.0 - 0.5 K/uL Final  . Basophils Relative 01/12/2020 1  % Final  . Basophils Absolute 01/12/2020 0.1  0.0 - 0.1 K/uL Final  . Smear Review 01/12/2020 PLATELET COUNT CONFIRMED BY SMEAR   Final   Reviewed  . Immature Granulocytes 01/12/2020 0  % Final  . Abs Immature Granulocytes 01/12/2020 0.04  0.00 - 0.07 K/uL Final  . Giant PLTs 01/12/2020 PRESENT   Final   Performed at [facility], [address] [place], [place] [address]    STUDIES: No results found.  ASSESSMENT:  Chronic refractory ITP  PLAN:   1. Chronic refractory ITP: Patient had a poor response to Prednisone, WinRho, and Rituxan. She is status post splenectomy in 2007.  She received IVIG and Nplate she received while at [facility] with significant improvement of her platelets.  If Nplate no longer was effective, could retry IVIG at a future date.  Patient is currently receiving the max dose of Nplate at 10 mcg/kg.  Can also consider Promacta 12.76m or Tavalisse 100 mg BID if needed.  Patient's platelet count is 24 today,  therefore will proceed  with treatment today. She continues to only require treatment every 4 weeks.  Return to clinic in 4 weeks for repeat laboratory work, further evaluation, and continuation of treatment.   2. Weakness and fatigue: Chronic and unchanged. 3. Depression/dementia: Chronic and unchanged.  Patient is now at [employer]. 4.  Anemia: Resolved. 5.  Leukocytosis: White blood cell count remains elevated, but trending down.  No intervention is needed. 6.  Congestion: Resolved.  Patient expressed understanding and was in agreement with this plan. She also understands that She can call clinic at any time with any questions, concerns, or complaints.    Lloyd Huger, MD   01/12/2020 1:39 PM

## 2020-01-12 ENCOUNTER — Inpatient Hospital Stay: Payer: Medicare HMO

## 2020-01-12 ENCOUNTER — Inpatient Hospital Stay: Payer: Medicare HMO | Attending: Oncology

## 2020-01-12 ENCOUNTER — Encounter: Payer: Self-pay | Admitting: Oncology

## 2020-01-12 ENCOUNTER — Inpatient Hospital Stay: Payer: Medicare HMO | Admitting: Oncology

## 2020-01-12 ENCOUNTER — Other Ambulatory Visit: Payer: Self-pay

## 2020-01-12 VITALS — BP 132/65 | HR 88 | Temp 96.9°F | Resp 18 | Wt 132.5 lb

## 2020-01-12 DIAGNOSIS — D693 Immune thrombocytopenic purpura: Secondary | ICD-10-CM | POA: Insufficient documentation

## 2020-01-12 LAB — CBC WITH DIFFERENTIAL/PLATELET
Abs Immature Granulocytes: 0.04 10*3/uL (ref 0.00–0.07)
Basophils Absolute: 0.1 10*3/uL (ref 0.0–0.1)
Basophils Relative: 1 %
Eosinophils Absolute: 0.4 10*3/uL (ref 0.0–0.5)
Eosinophils Relative: 4 %
HCT: 40.6 % (ref 36.0–46.0)
Hemoglobin: 13.3 g/dL (ref 12.0–15.0)
Immature Granulocytes: 0 %
Lymphocytes Relative: 29 %
Lymphs Abs: 3.4 10*3/uL (ref 0.7–4.0)
MCH: 29.8 pg (ref 26.0–34.0)
MCHC: 32.8 g/dL (ref 30.0–36.0)
MCV: 91 fL (ref 80.0–100.0)
Monocytes Absolute: 1.3 10*3/uL — ABNORMAL HIGH (ref 0.1–1.0)
Monocytes Relative: 11 %
Neutro Abs: 6.4 10*3/uL (ref 1.7–7.7)
Neutrophils Relative %: 55 %
Platelets: 24 10*3/uL — CL (ref 150–400)
RBC: 4.46 MIL/uL (ref 3.87–5.11)
RDW: 17.6 % — ABNORMAL HIGH (ref 11.5–15.5)
WBC: 11.7 10*3/uL — ABNORMAL HIGH (ref 4.0–10.5)
nRBC: 0 % (ref 0.0–0.2)

## 2020-01-12 MED ORDER — ROMIPLOSTIM INJECTION 500 MCG
620.0000 ug | Freq: Once | SUBCUTANEOUS | Status: AC
  Administered 2020-01-12: 12:00:00 620 ug via SUBCUTANEOUS
  Filled 2020-01-12: qty 1

## 2020-02-03 NOTE — Progress Notes (Deleted)
[place]  Telephone:(336) [phone]  Fax:(336) [phone]     Ariana [name] DOB: [birth year]  MR#: [id number]  OLM#:[id number]  Patient Care Team: Idelle Crouch, MD as PCP - General (Internal Medicine)  CHIEF COMPLAINT: Chronic refractory ITP  INTERVAL HISTORY: Patient returns to clinic today for repeat laboratory work, further evaluation, and continuation of Nplate.  She has noticed increased weakness and fatigue over the past several weeks, but otherwise feels well.  She continues to have memory loss and is a poor historian secondary to underlying dementia. She denies any falls in the interim. She has no neurologic complaints.  She denies any fevers.  She denies any chest pain, shortness of breath, cough, or hemoptysis.  She denies any nausea, vomiting, constipation, or diarrhea.  She has no melena or hematochezia.  She has no urinary complaints.  Patient offers no further specific complaints today.  REVIEW OF SYSTEMS:   Review of Systems  Constitutional: Positive for malaise/fatigue. Negative for fever and weight loss.  HENT: Negative for congestion.   Respiratory: Negative.  Negative for cough, hemoptysis and shortness of breath.   Cardiovascular: Negative.  Negative for chest pain and leg swelling.  Gastrointestinal: Negative.  Negative for abdominal pain, blood in stool, diarrhea, melena and nausea.  Genitourinary: Negative.  Negative for dysuria and hematuria.  Musculoskeletal: Negative.  Negative for back pain and falls.  Skin: Negative.  Negative for rash.  Neurological: Positive for weakness. Negative for dizziness, sensory change, focal weakness and headaches.  Endo/Heme/Allergies: Does not bruise/bleed easily.  Psychiatric/Behavioral: Positive for memory loss. Negative for depression. The patient is not nervous/anxious.     As per HPI. Otherwise, a complete review of systems is negative.  PAST MEDICAL HISTORY: Past Medical History:  Diagnosis  Date  . Alzheimer disease ([place])   . Anemia   . Anxiety   . Asthma   . Chronic back pain   . Depression   . Depression   . GERD (gastroesophageal reflux disease)   . Hiatal hernia   . Hypercholesteremia   . ITP (idiopathic thrombocytopenic purpura)   . Osteoarthritis   . Osteoporosis   . Pulmonary emboli ([place])   . Restless legs     PAST SURGICAL HISTORY: Past Surgical History:  Procedure Laterality Date  . CARPAL TUNNEL RELEASE    . ESOPHAGOGASTRODUODENOSCOPY (EGD) WITH PROPOFOL N/A 09/10/2015   Procedure: ESOPHAGOGASTRODUODENOSCOPY (EGD) WITH PROPOFOL;  Surgeon: Lollie Sails, MD;  Location: [facility] ENDOSCOPY;  Service: Endoscopy;  Laterality: N/A;  . IRRIGATION AND DEBRIDEMENT HEMATOMA Right 07/13/2018   Procedure: IRRIGATION AND DEBRIDEMENT HEMATOMA-RIGHT SHIN;  Surgeon: Herbert Pun, MD;  Location: [facility] ORS;  Service: General;  Laterality: Right;  . KYPHOPLASTY N/A 05/02/2019   Procedure: L1 KYPHOPLASTY;  Surgeon: Hessie Knows, MD;  Location: [facility] ORS;  Service: Orthopedics;  Laterality: N/A;  . LUMBAR [place]    . SPLENECTOMY, PARTIAL    . TOTAL ABDOMINAL HYSTERECTOMY      FAMILY HISTORY Family History  Problem Relation Age of Onset  . Stroke Mother     GYNECOLOGIC HISTORY:  No LMP recorded. Patient is postmenopausal.     ADVANCED DIRECTIVES:    HEALTH MAINTENANCE: Social History   Tobacco Use  . Smoking status: Never Smoker  . Smokeless tobacco: Never Used  Substance Use Topics  . Alcohol use: No    Alcohol/week: 0.0 standard drinks  . Drug use: No     Allergies  Allergen Reactions  . Aspirin  Upset stomach  . Diazepam Itching    sneezing  . Other     seasonal allergies  . Tetanus Toxoids     Localized superficial swelling of skin  . Morphine Itching and Rash    Current Outpatient Medications  Medication Sig Dispense Refill  . acetaminophen (TYLENOL) 325 MG tablet Take 2 tablets (650 mg total) by mouth every 6 (six) hours  as needed for mild pain (or Fever >/= 101). 100 tablet 0  . albuterol (PROVENTIL HFA;VENTOLIN HFA) 108 (90 BASE) MCG/ACT inhaler Inhale 2 puffs into the lungs every 6 (six) hours as needed for wheezing or shortness of breath.    . ALPRAZolam (XANAX) 0.5 MG tablet Take 1 tablet (0.5 mg total) by mouth 3 (three) times daily as needed for anxiety. 20 tablet 0  . amoxicillin-clavulanate (AUGMENTIN) 500-125 MG tablet     . benzonatate (TESSALON) 200 MG capsule     . buPROPion (WELLBUTRIN XL) 150 MG 24 hr tablet Take 150 mg by mouth daily.    . busPIRone (BUSPAR) 15 MG tablet Take 15 mg by mouth 2 (two) times daily.     . Calcium Carbonate-Vit D-Min (CALCIUM 600+D PLUS MINERALS) 600-400 MG-UNIT TABS Take 1 tablet by mouth daily.     . Carboxymethylcellul-Glycerin (LUBRICATING EYE DROPS OP) Place 1 drop into both eyes daily as needed (dry eyes).    . cholecalciferol (VITAMIN D3) 25 MCG (1000 UT) tablet Take 1,000 Units by mouth 2 (two) times a day.    . citalopram (CELEXA) 40 MG tablet Take 40 mg by mouth daily.    . Cyanocobalamin (RA VITAMIN B-12 TR) 1000 MCG TBCR Take 1,000 mcg by mouth daily.     . cyclobenzaprine (FLEXERIL) 5 MG tablet Take 5 mg by mouth 3 (three) times daily as needed for muscle spasms.    Marland Kitchen donepezil (ARICEPT) 10 MG tablet Take 10 mg by mouth at bedtime.     . fluticasone (FLONASE) 50 MCG/ACT nasal spray Place 2 sprays into both nostrils daily as needed for allergies.     . Fluticasone-Salmeterol (ADVAIR) 250-50 MCG/DOSE AEPB Inhale 1 puff into the lungs daily as needed (shortness of breath).     . gabapentin (NEURONTIN) 300 MG capsule Take 300 mg by mouth 4 (four) times daily.    Marland Kitchen levothyroxine (SYNTHROID, LEVOTHROID) 50 MCG tablet Take 50 mcg by mouth daily before breakfast. Take 30 to 60 minutes before breakfast.    . memantine (NAMENDA) 10 MG tablet Take 10 mg by mouth daily.     . methylPREDNISolone (MEDROL DOSEPAK) 4 MG TBPK tablet     . montelukast (SINGULAIR) 10 MG  tablet Take 10 mg by mouth at bedtime.     . Multiple Vitamin (MULTI-VITAMIN) tablet Take 1 tablet by mouth daily.     . Multiple Vitamins-Minerals (PRESERVISION AREDS 2) CAPS Take 1 capsule by mouth 2 (two) times a day.    Marland Kitchen NARCAN 4 MG/0.1ML LIQD nasal spray kit Place 1 spray into the nose once.     . nystatin (MYCOSTATIN) 100000 UNIT/ML suspension Take 5 mLs (500,000 Units total) by mouth 4 (four) times daily. 140 mL 0  . oxybutynin (DITROPAN) 5 MG tablet Take 5 mg by mouth 3 (three) times daily.    . pantoprazole (PROTONIX) 20 MG tablet Take 20 mg by mouth 2 (two) times daily.     . pravastatin (PRAVACHOL) 20 MG tablet Take 20 mg by mouth daily.    . vitamin C (ASCORBIC ACID) 500  MG tablet Take 500 mg by mouth daily.    . vitamin E (E-400) 400 UNIT capsule Take 400 Units by mouth daily.     No current facility-administered medications for this visit.    OBJECTIVE: There were no vitals taken for this visit.   There is no height or weight on file to calculate BMI.    ECOG FS:1 - Symptomatic but completely ambulatory  General: Well-developed, well-nourished, no acute distress. Eyes: Pink conjunctiva, anicteric sclera. HEENT: Normocephalic, moist mucous membranes. Lungs: No audible wheezing or coughing. Heart: Regular rate and rhythm. Abdomen: Soft, nontender, no obvious distention. Musculoskeletal: No edema, cyanosis, or clubbing. Neuro: Alert, answering all questions appropriately. Cranial nerves grossly intact. Skin: No rashes or petechiae noted. Psych: Normal affect.   LAB RESULTS:  No visits with results within 3 Day(s) from this visit.  Latest known visit with results is:  Appointment on 01/12/2020  Component Date Value Ref Range Status  . WBC 01/12/2020 11.7* 4.0 - 10.5 K/uL Final  . RBC 01/12/2020 4.46  3.87 - 5.11 MIL/uL Final  . Hemoglobin 01/12/2020 13.3  12.0 - 15.0 g/dL Final  . HCT 01/12/2020 40.6  36.0 - 46.0 % Final  . MCV 01/12/2020 91.0  80.0 - 100.0 fL Final    . MCH 01/12/2020 29.8  26.0 - 34.0 pg Final  . MCHC 01/12/2020 32.8  30.0 - 36.0 g/dL Final  . RDW 01/12/2020 17.6* 11.5 - 15.5 % Final  . Platelets 01/12/2020 24* 150 - 400 K/uL Final   Comment: REPEATED TO VERIFY Immature Platelet Fraction may be clinically indicated, consider ordering this additional test [id number] THIS CRITICAL RESULT HAS VERIFIED AND BEEN CALLED TO DR. Kamdon Reisig BY LONNIE RHONE ON 04 02 2021 AT 1101, AND HAS BEEN READ BACK. @ 10:40 AM   . nRBC 01/12/2020 0.0  0.0 - 0.2 % Final  . Neutrophils Relative % 01/12/2020 55  % Final  . Neutro Abs 01/12/2020 6.4  1.7 - 7.7 K/uL Final  . Lymphocytes Relative 01/12/2020 29  % Final  . Lymphs Abs 01/12/2020 3.4  0.7 - 4.0 K/uL Final  . Monocytes Relative 01/12/2020 11  % Final  . Monocytes Absolute 01/12/2020 1.3* 0.1 - 1.0 K/uL Final  . Eosinophils Relative 01/12/2020 4  % Final  . Eosinophils Absolute 01/12/2020 0.4  0.0 - 0.5 K/uL Final  . Basophils Relative 01/12/2020 1  % Final  . Basophils Absolute 01/12/2020 0.1  0.0 - 0.1 K/uL Final  . Smear Review 01/12/2020 PLATELET COUNT CONFIRMED BY SMEAR   Final   Reviewed  . Immature Granulocytes 01/12/2020 0  % Final  . Abs Immature Granulocytes 01/12/2020 0.04  0.00 - 0.07 K/uL Final  . Giant PLTs 01/12/2020 PRESENT   Final   Performed at [facility], [address] [place], [place] [address]    STUDIES: No results found.  ASSESSMENT:  Chronic refractory ITP  PLAN:   1. Chronic refractory ITP: Patient had a poor response to Prednisone, WinRho, and Rituxan. She is status post splenectomy in 2007.  She received IVIG and Nplate she received while at [facility] with significant improvement of her platelets.  If Nplate no longer was effective, could retry IVIG at a future date.  Patient is currently receiving the max dose of Nplate at 10 mcg/kg.  Can also consider Promacta 12.'5mg'$  or Tavalisse 100 mg BID if needed.  Patient's platelet count is 24 today, therefore will proceed  with treatment today. She continues to only require treatment  every 4 weeks.  Return to clinic in 4 weeks for repeat laboratory work, further evaluation, and continuation of treatment.   2. Weakness and fatigue: Chronic and unchanged. 3. Depression/dementia: Chronic and unchanged.  Patient is now at [employer]. 4.  Anemia: Resolved. 5.  Leukocytosis: White blood cell count remains elevated, but trending down.  No intervention is needed. 6.  Congestion: Resolved.  Patient expressed understanding and was in agreement with this plan. She also understands that She can call clinic at any time with any questions, concerns, or complaints.    Lloyd Huger, MD   02/03/2020 7:43 AM

## 2020-02-08 ENCOUNTER — Telehealth: Payer: Self-pay | Admitting: Oncology

## 2020-02-08 NOTE — Telephone Encounter (Signed)
Husband called to report that their son had passed away and she would not be at her 4/30 appt. He will call back to reschedule at a later date.

## 2020-02-09 ENCOUNTER — Inpatient Hospital Stay: Payer: Medicare HMO

## 2020-02-09 ENCOUNTER — Inpatient Hospital Stay: Payer: Medicare HMO | Admitting: Oncology

## 2020-02-14 ENCOUNTER — Other Ambulatory Visit: Payer: Self-pay

## 2020-02-14 DIAGNOSIS — E860 Dehydration: Secondary | ICD-10-CM | POA: Diagnosis present

## 2020-02-14 DIAGNOSIS — M5136 Other intervertebral disc degeneration, lumbar region: Secondary | ICD-10-CM | POA: Diagnosis present

## 2020-02-14 DIAGNOSIS — Z20822 Contact with and (suspected) exposure to covid-19: Secondary | ICD-10-CM | POA: Diagnosis present

## 2020-02-14 DIAGNOSIS — Z888 Allergy status to other drugs, medicaments and biological substances status: Secondary | ICD-10-CM

## 2020-02-14 DIAGNOSIS — K449 Diaphragmatic hernia without obstruction or gangrene: Secondary | ICD-10-CM | POA: Diagnosis present

## 2020-02-14 DIAGNOSIS — Z886 Allergy status to analgesic agent status: Secondary | ICD-10-CM

## 2020-02-14 DIAGNOSIS — Z887 Allergy status to serum and vaccine status: Secondary | ICD-10-CM

## 2020-02-14 DIAGNOSIS — D693 Immune thrombocytopenic purpura: Secondary | ICD-10-CM | POA: Diagnosis present

## 2020-02-14 DIAGNOSIS — R627 Adult failure to thrive: Secondary | ICD-10-CM | POA: Diagnosis present

## 2020-02-14 DIAGNOSIS — D696 Thrombocytopenia, unspecified: Secondary | ICD-10-CM | POA: Diagnosis present

## 2020-02-14 DIAGNOSIS — G8929 Other chronic pain: Secondary | ICD-10-CM | POA: Diagnosis present

## 2020-02-14 DIAGNOSIS — G2581 Restless legs syndrome: Secondary | ICD-10-CM | POA: Diagnosis present

## 2020-02-14 DIAGNOSIS — D6959 Other secondary thrombocytopenia: Secondary | ICD-10-CM | POA: Diagnosis present

## 2020-02-14 DIAGNOSIS — M1990 Unspecified osteoarthritis, unspecified site: Secondary | ICD-10-CM | POA: Diagnosis present

## 2020-02-14 DIAGNOSIS — F0281 Dementia in other diseases classified elsewhere with behavioral disturbance: Secondary | ICD-10-CM | POA: Diagnosis present

## 2020-02-14 DIAGNOSIS — F329 Major depressive disorder, single episode, unspecified: Secondary | ICD-10-CM | POA: Diagnosis present

## 2020-02-14 DIAGNOSIS — G301 Alzheimer's disease with late onset: Secondary | ICD-10-CM | POA: Diagnosis present

## 2020-02-14 DIAGNOSIS — Z86711 Personal history of pulmonary embolism: Secondary | ICD-10-CM | POA: Diagnosis not present

## 2020-02-14 DIAGNOSIS — J45909 Unspecified asthma, uncomplicated: Secondary | ICD-10-CM | POA: Diagnosis present

## 2020-02-14 DIAGNOSIS — E039 Hypothyroidism, unspecified: Secondary | ICD-10-CM | POA: Diagnosis present

## 2020-02-14 DIAGNOSIS — M810 Age-related osteoporosis without current pathological fracture: Secondary | ICD-10-CM | POA: Diagnosis present

## 2020-02-14 DIAGNOSIS — G4733 Obstructive sleep apnea (adult) (pediatric): Secondary | ICD-10-CM | POA: Diagnosis present

## 2020-02-14 DIAGNOSIS — E785 Hyperlipidemia, unspecified: Secondary | ICD-10-CM | POA: Diagnosis present

## 2020-02-14 DIAGNOSIS — M8580 Other specified disorders of bone density and structure, unspecified site: Secondary | ICD-10-CM | POA: Diagnosis present

## 2020-02-14 DIAGNOSIS — Z79899 Other long term (current) drug therapy: Secondary | ICD-10-CM | POA: Diagnosis not present

## 2020-02-14 DIAGNOSIS — Z823 Family history of stroke: Secondary | ICD-10-CM

## 2020-02-14 DIAGNOSIS — F4322 Adjustment disorder with anxiety: Secondary | ICD-10-CM | POA: Diagnosis present

## 2020-02-14 DIAGNOSIS — G894 Chronic pain syndrome: Secondary | ICD-10-CM | POA: Diagnosis not present

## 2020-02-14 DIAGNOSIS — I10 Essential (primary) hypertension: Secondary | ICD-10-CM | POA: Diagnosis present

## 2020-02-14 DIAGNOSIS — K219 Gastro-esophageal reflux disease without esophagitis: Secondary | ICD-10-CM | POA: Diagnosis present

## 2020-02-14 DIAGNOSIS — Z885 Allergy status to narcotic agent status: Secondary | ICD-10-CM

## 2020-02-14 DIAGNOSIS — E7800 Pure hypercholesterolemia, unspecified: Secondary | ICD-10-CM | POA: Diagnosis present

## 2020-02-14 DIAGNOSIS — F0390 Unspecified dementia without behavioral disturbance: Secondary | ICD-10-CM | POA: Diagnosis not present

## 2020-02-14 DIAGNOSIS — Z6820 Body mass index (BMI) 20.0-20.9, adult: Secondary | ICD-10-CM

## 2020-02-14 DIAGNOSIS — Z79890 Hormone replacement therapy (postmenopausal): Secondary | ICD-10-CM

## 2020-02-14 DIAGNOSIS — M818 Other osteoporosis without current pathological fracture: Secondary | ICD-10-CM | POA: Diagnosis not present

## 2020-02-14 LAB — CBC
HCT: 42.3 % (ref 36.0–46.0)
Hemoglobin: 14.3 g/dL (ref 12.0–15.0)
MCH: 30.3 pg (ref 26.0–34.0)
MCHC: 33.8 g/dL (ref 30.0–36.0)
MCV: 89.6 fL (ref 80.0–100.0)
Platelets: 9 10*3/uL — CL (ref 150–400)
RBC: 4.72 MIL/uL (ref 3.87–5.11)
RDW: 17.1 % — ABNORMAL HIGH (ref 11.5–15.5)
WBC: 11.3 10*3/uL — ABNORMAL HIGH (ref 4.0–10.5)
nRBC: 0 % (ref 0.0–0.2)

## 2020-02-14 LAB — COMPREHENSIVE METABOLIC PANEL
ALT: 26 U/L (ref 0–44)
AST: 24 U/L (ref 15–41)
Albumin: 3.9 g/dL (ref 3.5–5.0)
Alkaline Phosphatase: 61 U/L (ref 38–126)
Anion gap: 6 (ref 5–15)
BUN: 14 mg/dL (ref 8–23)
CO2: 27 mmol/L (ref 22–32)
Calcium: 9 mg/dL (ref 8.9–10.3)
Chloride: 103 mmol/L (ref 98–111)
Creatinine, Ser: 0.85 mg/dL (ref 0.44–1.00)
GFR calc Af Amer: >60 mL/min (ref >60)
GFR calc non Af Amer: >60 mL/min (ref >60)
Glucose, Bld: 85 mg/dL (ref 70–99)
Potassium: 3.7 mmol/L (ref 3.5–5.1)
Sodium: 136 mmol/L (ref 135–145)
Total Bilirubin: 0.5 mg/dL (ref 0.3–1.2)
Total Protein: 6.3 g/dL — ABNORMAL LOW (ref 6.5–8.1)

## 2020-02-14 LAB — GLUCOSE, CAPILLARY: Glucose-Capillary: 65 mg/dL — ABNORMAL LOW (ref 70–99)

## 2020-02-14 NOTE — ED Triage Notes (Addendum)
Pt in with co weakness and decreased activity since last 01/09/23. Her son passed away 01/09/2023, has increased confusion and difficulty walking since, does have alzheimer's but states symptoms have worsened. Pt denies any pain, husband states does have a hx of low platelets.  Husband states she has not had any recent illness or fever.

## 2020-02-15 ENCOUNTER — Inpatient Hospital Stay
Admission: EM | Admit: 2020-02-15 | Discharge: 2020-02-18 | DRG: 813 | Disposition: A | Discharge status: Home / Self Care | Payer: Medicare HMO | Attending: Internal Medicine | Admitting: Internal Medicine

## 2020-02-15 DIAGNOSIS — F0390 Unspecified dementia without behavioral disturbance: Secondary | ICD-10-CM | POA: Diagnosis not present

## 2020-02-15 DIAGNOSIS — G4733 Obstructive sleep apnea (adult) (pediatric): Secondary | ICD-10-CM | POA: Diagnosis present

## 2020-02-15 DIAGNOSIS — D696 Thrombocytopenia, unspecified: Secondary | ICD-10-CM | POA: Diagnosis present

## 2020-02-15 DIAGNOSIS — I10 Essential (primary) hypertension: Secondary | ICD-10-CM | POA: Diagnosis present

## 2020-02-15 DIAGNOSIS — D72829 Elevated white blood cell count, unspecified: Secondary | ICD-10-CM

## 2020-02-15 DIAGNOSIS — M818 Other osteoporosis without current pathological fracture: Secondary | ICD-10-CM

## 2020-02-15 DIAGNOSIS — E785 Hyperlipidemia, unspecified: Secondary | ICD-10-CM | POA: Diagnosis present

## 2020-02-15 DIAGNOSIS — F0280 Dementia in other diseases classified elsewhere without behavioral disturbance: Secondary | ICD-10-CM | POA: Diagnosis present

## 2020-02-15 DIAGNOSIS — R627 Adult failure to thrive: Secondary | ICD-10-CM

## 2020-02-15 DIAGNOSIS — D693 Immune thrombocytopenic purpura: Secondary | ICD-10-CM | POA: Diagnosis not present

## 2020-02-15 DIAGNOSIS — Z86711 Personal history of pulmonary embolism: Secondary | ICD-10-CM

## 2020-02-15 DIAGNOSIS — G894 Chronic pain syndrome: Secondary | ICD-10-CM | POA: Diagnosis present

## 2020-02-15 DIAGNOSIS — G301 Alzheimer's disease with late onset: Secondary | ICD-10-CM | POA: Diagnosis present

## 2020-02-15 DIAGNOSIS — F329 Major depressive disorder, single episode, unspecified: Secondary | ICD-10-CM

## 2020-02-15 DIAGNOSIS — M5136 Other intervertebral disc degeneration, lumbar region: Secondary | ICD-10-CM | POA: Diagnosis present

## 2020-02-15 DIAGNOSIS — M51369 Other intervertebral disc degeneration, lumbar region without mention of lumbar back pain or lower extremity pain: Secondary | ICD-10-CM | POA: Diagnosis present

## 2020-02-15 DIAGNOSIS — E039 Hypothyroidism, unspecified: Secondary | ICD-10-CM | POA: Diagnosis present

## 2020-02-15 LAB — MAGNESIUM: Magnesium: 2.1 mg/dL (ref 1.7–2.4)

## 2020-02-15 LAB — BASIC METABOLIC PANEL
Anion gap: 5 (ref 5–15)
BUN: 9 mg/dL (ref 8–23)
CO2: 24 mmol/L (ref 22–32)
Calcium: 8.7 mg/dL — ABNORMAL LOW (ref 8.9–10.3)
Chloride: 113 mmol/L — ABNORMAL HIGH (ref 98–111)
Creatinine, Ser: 0.77 mg/dL (ref 0.44–1.00)
GFR calc Af Amer: >60 mL/min (ref >60)
GFR calc non Af Amer: >60 mL/min (ref >60)
Glucose, Bld: 90 mg/dL (ref 70–99)
Potassium: 4.2 mmol/L (ref 3.5–5.1)
Sodium: 142 mmol/L (ref 135–145)

## 2020-02-15 LAB — TYPE AND SCREEN
ABO/RH(D): B POS
Antibody Screen: NEGATIVE

## 2020-02-15 LAB — CBC
HCT: 37.6 % (ref 36.0–46.0)
Hemoglobin: 12.5 g/dL (ref 12.0–15.0)
MCH: 30.4 pg (ref 26.0–34.0)
MCHC: 33.2 g/dL (ref 30.0–36.0)
MCV: 91.5 fL (ref 80.0–100.0)
Platelets: 10 10*3/uL — CL (ref 150–400)
RBC: 4.11 MIL/uL (ref 3.87–5.11)
RDW: 16.8 % — ABNORMAL HIGH (ref 11.5–15.5)
WBC: 10 10*3/uL (ref 4.0–10.5)
nRBC: 0 % (ref 0.0–0.2)

## 2020-02-15 LAB — GLUCOSE, CAPILLARY: Glucose-Capillary: 95 mg/dL (ref 70–99)

## 2020-02-15 LAB — RESPIRATORY PANEL BY RT PCR (FLU A&B, COVID)
Influenza A by PCR: NEGATIVE
Influenza B by PCR: NEGATIVE
SARS Coronavirus 2 by RT PCR: NEGATIVE

## 2020-02-15 MED ORDER — SODIUM CHLORIDE 0.9 % IV SOLN: INTRAVENOUS | Status: DC

## 2020-02-15 MED ORDER — OXYBUTYNIN CHLORIDE 5 MG PO TABS
5.0000 mg | ORAL_TABLET | Freq: Three times a day (TID) | ORAL | Status: DC
  Administered 2020-02-15 – 2020-02-18 (×9): 5 mg via ORAL
  Filled 2020-02-15 (×12): qty 1

## 2020-02-15 MED ORDER — LEVOTHYROXINE SODIUM 50 MCG PO TABS
50.0000 ug | ORAL_TABLET | Freq: Every day | ORAL | Status: DC
  Administered 2020-02-15 – 2020-02-18 (×4): 50 ug via ORAL
  Filled 2020-02-15 (×4): qty 1

## 2020-02-15 MED ORDER — POLYVINYL ALCOHOL 1.4 % OP SOLN
1.0000 [drp] | Freq: Every day | OPHTHALMIC | Status: DC | PRN
  Filled 2020-02-15: qty 15

## 2020-02-15 MED ORDER — ACETAMINOPHEN 650 MG RE SUPP: 650.0000 mg | Freq: Four times a day (QID) | RECTAL | Status: DC | PRN

## 2020-02-15 MED ORDER — ROMIPLOSTIM INJECTION 500 MCG
10.0000 ug/kg | Freq: Once | SUBCUTANEOUS | Status: AC
  Administered 2020-02-15: 565 ug via SUBCUTANEOUS
  Filled 2020-02-15: qty 1.13

## 2020-02-15 MED ORDER — BUSPIRONE HCL 10 MG PO TABS
15.0000 mg | ORAL_TABLET | Freq: Two times a day (BID) | ORAL | Status: DC
  Administered 2020-02-15 – 2020-02-18 (×6): 15 mg via ORAL
  Filled 2020-02-15 (×3): qty 2
  Filled 2020-02-15: qty 3
  Filled 2020-02-15 (×3): qty 2

## 2020-02-15 MED ORDER — MAGNESIUM HYDROXIDE 400 MG/5ML PO SUSP: 30.0000 mL | Freq: Every day | ORAL | Status: DC | PRN

## 2020-02-15 MED ORDER — PANTOPRAZOLE SODIUM 40 MG PO TBEC
40.0000 mg | DELAYED_RELEASE_TABLET | Freq: Every day | ORAL | Status: DC
  Administered 2020-02-15 – 2020-02-18 (×4): 40 mg via ORAL
  Filled 2020-02-15 (×4): qty 1

## 2020-02-15 MED ORDER — SODIUM CHLORIDE 0.9 % IV SOLN: Freq: Once | INTRAVENOUS | Status: DC

## 2020-02-15 MED ORDER — CALCIUM CARBONATE-VITAMIN D 500-200 MG-UNIT PO TABS
ORAL_TABLET | Freq: Every day | ORAL | Status: DC
  Administered 2020-02-16 – 2020-02-18 (×3): 1 via ORAL
  Filled 2020-02-15 (×4): qty 1

## 2020-02-15 MED ORDER — VITAMIN D 25 MCG (1000 UNIT) PO TABS
1000.0000 [IU] | ORAL_TABLET | Freq: Every day | ORAL | Status: DC
  Administered 2020-02-15 – 2020-02-18 (×4): 1000 [IU] via ORAL
  Filled 2020-02-15 (×4): qty 1

## 2020-02-15 MED ORDER — SODIUM CHLORIDE 0.9 % IV BOLUS
1000.0000 mL | Freq: Once | INTRAVENOUS | Status: AC
  Administered 2020-02-15: 1000 mL via INTRAVENOUS

## 2020-02-15 MED ORDER — ONDANSETRON HCL 4 MG/2ML IJ SOLN: 4.0000 mg | Freq: Four times a day (QID) | INTRAMUSCULAR | Status: DC | PRN

## 2020-02-15 MED ORDER — ROMIPLOSTIM INJECTION 500 MCG: 570.0000 ug | Freq: Once | SUBCUTANEOUS | Status: DC

## 2020-02-15 MED ORDER — ADULT MULTIVITAMIN W/MINERALS CH
1.0000 | ORAL_TABLET | Freq: Every day | ORAL | Status: DC
  Administered 2020-02-16 – 2020-02-18 (×3): 1 via ORAL
  Filled 2020-02-15 (×5): qty 1

## 2020-02-15 MED ORDER — ONDANSETRON HCL 4 MG/2ML IJ SOLN
4.0000 mg | Freq: Four times a day (QID) | INTRAMUSCULAR | Status: DC | PRN
  Administered 2020-02-15: 4 mg via INTRAVENOUS
  Filled 2020-02-15: qty 2

## 2020-02-15 MED ORDER — VITAMIN E 180 MG (400 UNIT) PO CAPS
400.0000 [IU] | ORAL_CAPSULE | Freq: Every day | ORAL | Status: DC
  Administered 2020-02-15 – 2020-02-18 (×4): 400 [IU] via ORAL
  Filled 2020-02-15 (×4): qty 1

## 2020-02-15 MED ORDER — PRAVASTATIN SODIUM 20 MG PO TABS
20.0000 mg | ORAL_TABLET | Freq: Every day | ORAL | Status: DC
  Administered 2020-02-15 – 2020-02-17 (×3): 20 mg via ORAL
  Filled 2020-02-15 (×4): qty 1

## 2020-02-15 MED ORDER — ACETAMINOPHEN 325 MG PO TABS
650.0000 mg | ORAL_TABLET | Freq: Four times a day (QID) | ORAL | Status: DC | PRN
  Administered 2020-02-16 – 2020-02-18 (×2): 650 mg via ORAL
  Filled 2020-02-15 (×2): qty 2

## 2020-02-15 MED ORDER — FLUTICASONE PROPIONATE 50 MCG/ACT NA SUSP
2.0000 | Freq: Every day | NASAL | Status: DC | PRN
  Filled 2020-02-15: qty 16

## 2020-02-15 MED ORDER — TRAZODONE HCL 50 MG PO TABS
25.0000 mg | ORAL_TABLET | Freq: Every evening | ORAL | Status: DC | PRN
  Administered 2020-02-16 – 2020-02-17 (×3): 25 mg via ORAL
  Filled 2020-02-15 (×3): qty 1

## 2020-02-15 MED ORDER — DONEPEZIL HCL 5 MG PO TABS
10.0000 mg | ORAL_TABLET | Freq: Every day | ORAL | Status: DC
  Administered 2020-02-16 – 2020-02-17 (×2): 10 mg via ORAL
  Filled 2020-02-15 (×3): qty 2

## 2020-02-15 MED ORDER — MOMETASONE FURO-FORMOTEROL FUM 200-5 MCG/ACT IN AERO
2.0000 | INHALATION_SPRAY | Freq: Two times a day (BID) | RESPIRATORY_TRACT | Status: DC
  Administered 2020-02-16 – 2020-02-18 (×4): 2 via RESPIRATORY_TRACT
  Filled 2020-02-15 (×2): qty 8.8

## 2020-02-15 MED ORDER — ACETAMINOPHEN 325 MG PO TABS
650.0000 mg | ORAL_TABLET | Freq: Once | ORAL | Status: AC
  Administered 2020-02-15: 650 mg via ORAL
  Filled 2020-02-15: qty 2

## 2020-02-15 MED ORDER — VITAMIN B-12 1000 MCG PO TABS
1000.0000 ug | ORAL_TABLET | Freq: Every day | ORAL | Status: DC
  Administered 2020-02-16 – 2020-02-18 (×3): 1000 ug via ORAL
  Filled 2020-02-15 (×4): qty 1

## 2020-02-15 MED ORDER — NALOXONE HCL 4 MG/0.1ML NA LIQD: 1.0000 | Freq: Once | NASAL | Status: DC

## 2020-02-15 MED ORDER — MEMANTINE HCL 5 MG PO TABS
10.0000 mg | ORAL_TABLET | Freq: Every day | ORAL | Status: DC
  Administered 2020-02-15 – 2020-02-18 (×4): 10 mg via ORAL
  Filled 2020-02-15 (×4): qty 2

## 2020-02-15 MED ORDER — MONTELUKAST SODIUM 10 MG PO TABS
10.0000 mg | ORAL_TABLET | Freq: Every day | ORAL | Status: DC
  Administered 2020-02-16 – 2020-02-17 (×2): 10 mg via ORAL
  Filled 2020-02-15 (×4): qty 1

## 2020-02-15 MED ORDER — BUPROPION HCL ER (XL) 150 MG PO TB24
150.0000 mg | ORAL_TABLET | Freq: Every day | ORAL | Status: DC
  Administered 2020-02-15 – 2020-02-18 (×4): 150 mg via ORAL
  Filled 2020-02-15 (×4): qty 1

## 2020-02-15 MED ORDER — ONDANSETRON HCL 4 MG PO TABS: 4.0000 mg | ORAL_TABLET | Freq: Four times a day (QID) | ORAL | Status: DC | PRN

## 2020-02-15 MED ORDER — ALPRAZOLAM 0.5 MG PO TABS
0.5000 mg | ORAL_TABLET | Freq: Three times a day (TID) | ORAL | Status: DC | PRN
  Administered 2020-02-15 – 2020-02-18 (×7): 0.5 mg via ORAL
  Filled 2020-02-15 (×8): qty 1

## 2020-02-15 MED ORDER — ASCORBIC ACID 500 MG PO TABS
500.0000 mg | ORAL_TABLET | Freq: Every day | ORAL | Status: DC
  Administered 2020-02-15 – 2020-02-18 (×4): 500 mg via ORAL
  Filled 2020-02-15 (×4): qty 1

## 2020-02-15 MED ORDER — OCUVITE-LUTEIN PO CAPS
1.0000 | ORAL_CAPSULE | Freq: Every day | ORAL | Status: DC
  Administered 2020-02-16 – 2020-02-18 (×3): 1 via ORAL
  Filled 2020-02-15 (×4): qty 1

## 2020-02-15 MED ORDER — BENZONATATE 100 MG PO CAPS: 100.0000 mg | ORAL_CAPSULE | ORAL | Status: DC | PRN

## 2020-02-15 MED ORDER — CITALOPRAM HYDROBROMIDE 20 MG PO TABS: 40.0000 mg | ORAL_TABLET | Freq: Every day | ORAL | Status: DC

## 2020-02-15 MED ORDER — ALBUTEROL SULFATE (2.5 MG/3ML) 0.083% IN NEBU: 2.5000 mg | INHALATION_SOLUTION | Freq: Four times a day (QID) | RESPIRATORY_TRACT | Status: DC | PRN

## 2020-02-15 NOTE — ED Notes (Signed)
Ariana Ormond, MD made aware of critical platelet lab. Advised to hold transfusion if patient is not bleeding. Patient does not appear to be bleeding. Transfusion held at this time.

## 2020-02-15 NOTE — ED Notes (Signed)
Pt states she is nauseous and wants to go to sleep- will attempt rest of PO meds after zofran

## 2020-02-15 NOTE — H&P (Signed)
[facility] [place] NAME: Ariana [name]    MR#:  [id number]  DATE OF BIRTH:  1933/04/08  DATE OF ADMISSION:  02/15/2020  PRIMARY CARE PHYSICIAN: Idelle Crouch, MD   REQUESTING/REFERRING PHYSICIAN: Marjean Donna, MD  CHIEF COMPLAINT:   Chief Complaint  Patient presents with  . Weakness    HISTORY OF PRESENT ILLNESS:  Ariana [name]  is a 86 y.o. Caucasian female with a known history of chronic ITP on Romiplostim, Alzheimer's dementia, asthma, depression and dyslipidemia, presented to the emergency room with failure to thrive.  She has not been eating or drinking since she lost her son who died last 01/22/2023.  Her husband gives all the history stated that she had no bleeding tendencies.  No fever chills or nausea or vomiting or abdominal pain.  No melena or bright red bleeding per rectum.  No dysuria, oliguria or hematuria or flank pain.  No cough or wheezing or hemoptysis.  No chest pain or palpitations.  Upon presentation to the emergency room, blood pressure was 126/52 with otherwise normal vital signs.  Labs were remarkable for platelets of 9 down from 24 on 01/12/2020.  Her highest platelets were 40 on 09/22/2019.  COVID-19 PCR and influenza antigens came back negative.  Dr. Marc Morgans was contacted and recommended romiplostim however he was not available and therefore the patient will get 2 packs platelets will be placed in observation in a medically monitored bed. PAST MEDICAL HISTORY:   Past Medical History:  Diagnosis Date  . Alzheimer disease ([place])   . Anemia   . Anxiety   . Asthma   . Chronic back pain   . Depression   . Depression   . GERD (gastroesophageal reflux disease)   . Hiatal hernia   . Hypercholesteremia   . ITP (idiopathic thrombocytopenic purpura)   . Osteoarthritis   . Osteoporosis   . Pulmonary emboli ([place])   . Restless legs     PAST SURGICAL HISTORY:   Past Surgical History:  Procedure Laterality Date  .  CARPAL TUNNEL RELEASE    . ESOPHAGOGASTRODUODENOSCOPY (EGD) WITH PROPOFOL N/A 09/10/2015   Procedure: ESOPHAGOGASTRODUODENOSCOPY (EGD) WITH PROPOFOL;  Surgeon: Lollie Sails, MD;  Location: [facility] ENDOSCOPY;  Service: Endoscopy;  Laterality: N/A;  . IRRIGATION AND DEBRIDEMENT HEMATOMA Right 07/13/2018   Procedure: IRRIGATION AND DEBRIDEMENT HEMATOMA-RIGHT SHIN;  Surgeon: Herbert Pun, MD;  Location: [facility] ORS;  Service: General;  Laterality: Right;  . KYPHOPLASTY N/A 05/02/2019   Procedure: L1 KYPHOPLASTY;  Surgeon: Hessie Knows, MD;  Location: [facility] ORS;  Service: Orthopedics;  Laterality: N/A;  . LUMBAR [place]    . SPLENECTOMY, PARTIAL    . TOTAL ABDOMINAL HYSTERECTOMY      SOCIAL HISTORY:   Social History   Tobacco Use  . Smoking status: Never Smoker  . Smokeless tobacco: Never Used  Substance Use Topics  . Alcohol use: No    Alcohol/week: 0.0 standard drinks    FAMILY HISTORY:   Family History  Problem Relation Age of Onset  . Stroke Mother     DRUG ALLERGIES:   Allergies  Allergen Reactions  . Aspirin     Upset stomach  . Diazepam Itching    sneezing  . Other     seasonal allergies  . Tetanus Toxoids     Localized superficial swelling of skin  . Morphine Itching and Rash    REVIEW OF SYSTEMS:   ROS As per history of present illness. All  pertinent systems were reviewed above. Constitutional,  HEENT, cardiovascular, respiratory, GI, GU, musculoskeletal, neuro, psychiatric, endocrine,  integumentary and hematologic systems were reviewed and are otherwise  negative/unremarkable except for positive findings mentioned above in the HPI.   MEDICATIONS AT HOME:   Prior to Admission medications   Medication Sig Start Date End Date Taking? Authorizing Provider  acetaminophen (TYLENOL) 325 MG tablet Take 2 tablets (650 mg total) by mouth every 6 (six) hours as needed for mild pain (or Fever >/= 101). 07/02/15   Idelle Crouch, MD  albuterol  (PROVENTIL HFA;VENTOLIN HFA) 108 (90 BASE) MCG/ACT inhaler Inhale 2 puffs into the lungs every 6 (six) hours as needed for wheezing or shortness of breath.    [provider]  ALPRAZolam Duanne Moron) 0.5 MG tablet Take 1 tablet (0.5 mg total) by mouth 3 (three) times daily as needed for anxiety. 07/15/18   Gladstone Lighter, MD  amoxicillin-clavulanate (AUGMENTIN) 500-125 MG tablet  12/11/19   [provider]  benzonatate (TESSALON) 200 MG capsule  12/11/19   [provider]  buPROPion (WELLBUTRIN XL) 150 MG 24 hr tablet Take 150 mg by mouth daily.    [provider]  busPIRone (BUSPAR) 15 MG tablet Take 15 mg by mouth 2 (two) times daily.  08/15/18   [provider]  Calcium Carbonate-Vit D-Min (CALCIUM 600+D PLUS MINERALS) 600-400 MG-UNIT TABS Take 1 tablet by mouth daily.     [provider]  Carboxymethylcellul-Glycerin (LUBRICATING EYE DROPS OP) Place 1 drop into both eyes daily as needed (dry eyes).    [provider]  cholecalciferol (VITAMIN D3) 25 MCG (1000 UT) tablet Take 1,000 Units by mouth 2 (two) times a day.    [provider]  citalopram (CELEXA) 40 MG tablet Take 40 mg by mouth daily.    [provider]  Cyanocobalamin (RA VITAMIN B-12 TR) 1000 MCG TBCR Take 1,000 mcg by mouth daily.     [provider]  cyclobenzaprine (FLEXERIL) 5 MG tablet Take 5 mg by mouth 3 (three) times daily as needed for muscle spasms. 04/26/19   [provider]  donepezil (ARICEPT) 10 MG tablet Take 10 mg by mouth at bedtime.  04/03/19 12/15/19  [provider]  fluticasone (FLONASE) 50 MCG/ACT nasal spray Place 2 sprays into both nostrils daily as needed for allergies.  03/03/17   [provider]  Fluticasone-Salmeterol (ADVAIR) 250-50 MCG/DOSE AEPB Inhale 1 puff into the lungs daily as needed (shortness of breath).     [provider]  gabapentin (NEURONTIN) 300 MG capsule Take 300 mg by mouth 4  (four) times daily.    [provider]  levothyroxine (SYNTHROID, LEVOTHROID) 50 MCG tablet Take 50 mcg by mouth daily before breakfast. Take 30 to 60 minutes before breakfast.    [provider]  memantine (NAMENDA) 10 MG tablet Take 10 mg by mouth daily.  04/03/19 12/15/19  [provider]  methylPREDNISolone (MEDROL DOSEPAK) 4 MG TBPK tablet  12/11/19   [provider]  montelukast (SINGULAIR) 10 MG tablet Take 10 mg by mouth at bedtime.     [provider]  Multiple Vitamin (MULTI-VITAMIN) tablet Take 1 tablet by mouth daily.     [provider]  Multiple Vitamins-Minerals (PRESERVISION AREDS 2) CAPS Take 1 capsule by mouth 2 (two) times a day.    [provider]  NARCAN 4 MG/0.1ML LIQD nasal spray kit Place 1 spray into the nose once.  10/24/18   [provider]  nystatin (MYCOSTATIN) 100000 UNIT/ML suspension Take 5 mLs (500,000 Units total) by mouth 4 (four) times daily. 07/12/19   Mayo, Pete Pelt, MD  oxybutynin (DITROPAN) 5 MG tablet Take 5 mg by mouth 3 (three) times daily.    [provider]  pantoprazole (PROTONIX) 20 MG tablet Take 20 mg by mouth 2 (two) times daily.  07/24/15   [provider]  pravastatin (PRAVACHOL) 20 MG tablet Take 20 mg by mouth daily.    [provider]  vitamin C (ASCORBIC ACID) 500 MG tablet Take 500 mg by mouth daily.    [provider]  vitamin E (E-400) 400 UNIT capsule Take 400 Units by mouth daily.    [provider]      VITAL SIGNS:  Blood pressure 138/75, pulse (!) 103, temperature 97.6 F (36.4 C), temperature source Oral, resp. rate 16, weight 56.7 kg, SpO2 100 %.  PHYSICAL EXAMINATION:  Physical Exam  GENERAL:  86 y.o.-year-old Caucasian female patient lying in the bed with no acute distress.  EYES: Pupils equal, round, reactive to light and accommodation. No scleral icterus. Extraocular muscles intact.  HEENT: Head atraumatic,  normocephalic. Oropharynx and nasopharynx clear.  NECK:  Supple, no jugular venous distention. No thyroid enlargement, no tenderness.  LUNGS: Normal breath sounds bilaterally, no wheezing, rales,rhonchi or crepitation. No use of accessory muscles of respiration.  CARDIOVASCULAR: Regular rate and rhythm, S1, S2 normal. No murmurs, rubs, or gallops.  ABDOMEN: Soft, nondistended, nontender. Bowel sounds present. No organomegaly or mass.  EXTREMITIES: No pedal edema, cyanosis, or clubbing.  NEUROLOGIC: Cranial nerves II through XII are intact. Muscle strength 5/5 in all extremities. Sensation intact. Gait not checked.  PSYCHIATRIC: The patient is pleasantly demented and disoriented.  Normal affect and good eye contact. SKIN: Scattered ecchymotic patches over both upper and lower extremities.  LABORATORY PANEL:   CBC Recent Labs  Lab 02/14/20 2253  WBC 11.3*  HGB 14.3  HCT 42.3  PLT 9*   ------------------------------------------------------------------------------------------------------------------  Chemistries  Recent Labs  Lab 02/14/20 2253  NA 136  K 3.7  CL 103  CO2 27  GLUCOSE 85  BUN 14  CREATININE 0.85  CALCIUM 9.0  AST 24  ALT 26  ALKPHOS 61  BILITOT 0.5   ------------------------------------------------------------------------------------------------------------------  Cardiac Enzymes No results for input(s): TROPONINI in the last 168 hours. ------------------------------------------------------------------------------------------------------------------  RADIOLOGY:  No results found.    IMPRESSION AND PLAN:   1.  Severe thrombocytopenia with chronic ITP. -The patient will be admitted to a medically monitored observation bed. -We will transfuse 2 packs of platelets. -Hematology consultation will be obtained.  Dr. Grayland Ormond was notified about the patient. -She was also ordered Romiplostim whenever it is available. -We will follow platelets level.  2.   Depression and anxiety. -We will continue Wellbutrin XL and BuSpar.  3.  Dementia. -We will continue Aricept and Namenda.  4.  Hypothyroidism. -We will continue Synthroid and check TSH.  5.  Asthma without exacerbation. -We will continue Singulair and Advair Diskus.  6.  DVT prophylaxis. -SCDs. -Medical prophylaxis is contraindicated due to severe thrombocytopenia.  All the records are reviewed and case discussed with ED provider. The plan of care was discussed in details with the patient (and family). I answered all questions. The patient agreed to proceed with the above mentioned plan. Further management will depend upon hospital course.   CODE STATUS: Full code  Status is: Observation  The patient remains OBS appropriate and will d/c before 2 midnights.  Dispo: The patient is from: Home              Anticipated d/c is to: Home              Anticipated d/c date is: 1 day              Patient currently is not medically stable to d/c.   TOTAL TIME TAKING CARE OF THIS PATIENT: 55 minutes.    Ariana [name] M.D on 02/15/2020 at 4:17 AM  [facility]   From 7 PM-7 AM, contact night-coverage [email or web address]  CC: Primary care physician; Idelle Crouch, MD   Note: This dictation was prepared with Dragon dictation along with smaller phrase technology. Any transcriptional errors that result from this process are unintentional.

## 2020-02-15 NOTE — ED Notes (Signed)
Dr Gaetana Michaelis for nausea medications

## 2020-02-15 NOTE — Consult Note (Signed)
[facility]  [phone] [phone]  ID: Ariana [name] OB: [birth year]  MR#: [id number]  XHB#:[id number]  Patient Care Team: Idelle Crouch, MD as PCP - General (Internal Medicine)  CHIEF COMPLAINT: Chronic ITP, failure to thrive.  INTERVAL HISTORY: Patient is an 86 year old female with longstanding ITP who requires injections approximately every 4 weeks.  Her next injection is due tomorrow.  Her son recently passed away and patient was experiencing extreme grief and by report had not been eating or drinking over the past week.  He was brought to the hospital with failure to thrive and found to have a platelet count of 9.  Review of systems is difficult given her underlying mild dementia.  She has no neurologic complaints.  She denies any fevers.  She has no chest pain, shortness of breath, cough, or hemoptysis.  She denies any nausea, vomiting, constipation, or diarrhea.  She has no urinary complaints.  Patient offers no further specific complaints today.    REVIEW OF SYSTEMS:   Review of Systems  Constitutional: Positive for malaise/fatigue. Negative for fever and weight loss.  Respiratory: Negative.  Negative for cough, hemoptysis and shortness of breath.   Cardiovascular: Negative.  Negative for chest pain.  Gastrointestinal: Negative.  Negative for abdominal pain and blood in stool.  Genitourinary: Negative.  Negative for hematuria.  Musculoskeletal: Negative.  Negative for back pain.  Skin: Negative.  Negative for rash.  Neurological: Positive for weakness. Negative for dizziness, focal weakness and headaches.  Endo/Heme/Allergies: Does not bruise/bleed easily.  Psychiatric/Behavioral: Positive for depression and memory loss.    As per HPI. Otherwise, a complete review of systems is negative.  PAST MEDICAL HISTORY: Past Medical History:  Diagnosis Date  . Alzheimer disease ([place])   . Anemia   . Anxiety   . Asthma   .  Chronic back pain   . Depression   . Depression   . GERD (gastroesophageal reflux disease)   . Hiatal hernia   . Hypercholesteremia   . ITP (idiopathic thrombocytopenic purpura)   . Osteoarthritis   . Osteoporosis   . Pulmonary emboli ([place])   . Restless legs     PAST SURGICAL HISTORY: Past Surgical History:  Procedure Laterality Date  . CARPAL TUNNEL RELEASE    . ESOPHAGOGASTRODUODENOSCOPY (EGD) WITH PROPOFOL N/A 09/10/2015   Procedure: ESOPHAGOGASTRODUODENOSCOPY (EGD) WITH PROPOFOL;  Surgeon: Lollie Sails, MD;  Location: [facility] ENDOSCOPY;  Service: Endoscopy;  Laterality: N/A;  . IRRIGATION AND DEBRIDEMENT HEMATOMA Right 07/13/2018   Procedure: IRRIGATION AND DEBRIDEMENT HEMATOMA-RIGHT SHIN;  Surgeon: Herbert Pun, MD;  Location: [facility] ORS;  Service: General;  Laterality: Right;  . KYPHOPLASTY N/A 05/02/2019   Procedure: L1 KYPHOPLASTY;  Surgeon: Hessie Knows, MD;  Location: [facility] ORS;  Service: Orthopedics;  Laterality: N/A;  . LUMBAR [place]    . SPLENECTOMY, PARTIAL    . TOTAL ABDOMINAL HYSTERECTOMY      FAMILY HISTORY: Family History  Problem Relation Age of Onset  . Stroke Mother     ADVANCED DIRECTIVES (Y/N):  '@ADVDIR'$ @  HEALTH MAINTENANCE: Social History   Tobacco Use  . Smoking status: Never Smoker  . Smokeless tobacco: Never Used  Substance Use Topics  . Alcohol use: No    Alcohol/week: 0.0 standard drinks  . Drug use: No     Colonoscopy:  PAP:  Bone density:  Lipid panel:  Allergies  Allergen Reactions  . Aspirin     Upset stomach  . Diazepam Itching  sneezing  . Other     seasonal allergies  . Tetanus Toxoids     Localized superficial swelling of skin  . Morphine Itching and Rash    Current Facility-Administered Medications  Medication Dose Route Frequency Provider Last Rate Last Admin  . 0.9 %  sodium chloride infusion   Intravenous Once Gregor Hams, MD   Stopped at 02/15/20 1233  . 0.9 %  sodium chloride infusion    Intravenous Continuous Mansy, Arvella Merles, MD 100 mL/hr at 02/15/20 0649 New Bag at 02/15/20 6333  . 0.9 %  sodium chloride infusion   Intravenous Once Mansy, Jan A, MD 10 mL/hr at 02/15/20 1250 Restarted at 02/15/20 1250  . acetaminophen (TYLENOL) tablet 650 mg  650 mg Oral Q6H PRN Mansy, Jan A, MD       Or  . acetaminophen (TYLENOL) suppository 650 mg  650 mg Rectal Q6H PRN Mansy, Jan A, MD      . albuterol (PROVENTIL) (2.5 MG/3ML) 0.083% nebulizer solution 2.5 mg  2.5 mg Inhalation Q6H PRN Mansy, Jan A, MD      . ALPRAZolam Duanne Moron) tablet 0.5 mg  0.5 mg Oral TID PRN Mansy, Jan A, MD      . ascorbic acid (VITAMIN C) tablet 500 mg  500 mg Oral Daily Mansy, Jan A, MD   500 mg at 02/15/20 1014  . benzonatate (TESSALON) capsule 100 mg  100 mg Oral Q4H PRN Mansy, Jan A, MD      . buPROPion (WELLBUTRIN XL) 24 hr tablet 150 mg  150 mg Oral Daily Mansy, Jan A, MD   150 mg at 02/15/20 1018  . busPIRone (BUSPAR) tablet 15 mg  15 mg Oral BID Mansy, Jan A, MD   15 mg at 02/15/20 1016  . calcium-vitamin D (OSCAL WITH D) 500-200 MG-UNIT per tablet   Oral Daily Mansy, Jan A, MD      . cholecalciferol (VITAMIN D3) tablet 1,000 Units  1,000 Units Oral Daily Mansy, Jan A, MD   1,000 Units at 02/15/20 1018  . donepezil (ARICEPT) tablet 10 mg  10 mg Oral QHS Mansy, Jan A, MD      . fluticasone [facility]) 50 MCG/ACT nasal spray 2 spray  2 spray Each Nare Daily PRN Mansy, Jan A, MD      . levothyroxine (SYNTHROID) tablet 50 mcg  50 mcg Oral QAC breakfast Mansy, Jan A, MD   50 mcg at 02/15/20 1014  . magnesium hydroxide (MILK OF MAGNESIA) suspension 30 mL  30 mL Oral Daily PRN Mansy, Jan A, MD      . memantine [facility]) tablet 10 mg  10 mg Oral Daily Mansy, Jan A, MD   10 mg at 02/15/20 1017  . mometasone-formoterol (DULERA) 200-5 MCG/ACT inhaler 2 puff  2 puff Inhalation BID Mansy, Jan A, MD      . montelukast (SINGULAIR) tablet 10 mg  10 mg Oral QHS Mansy, Jan A, MD      . multivitamin with minerals tablet 1 tablet  1  tablet Oral Daily Mansy, Jan A, MD      . multivitamin-lutein (OCUVITE-LUTEIN) capsule 1 capsule  1 capsule Oral Daily Mansy, Jan A, MD      . ondansetron [facility]) injection 4 mg  4 mg Intravenous Q6H PRN Nicole Kindred A, DO   4 mg at 02/15/20 1026  . oxybutynin (DITROPAN) tablet 5 mg  5 mg Oral TID Mansy, Jan A, MD   5 mg at 02/15/20 1019  .  pantoprazole (PROTONIX) EC tablet 40 mg  40 mg Oral Daily Mansy, Jan A, MD   40 mg at 02/15/20 1014  . polyvinyl alcohol (LIQUIFILM TEARS) 1.4 % ophthalmic solution 1 drop  1 drop Both Eyes Daily PRN Mansy, Jan A, MD      . pravastatin (PRAVACHOL) tablet 20 mg  20 mg Oral Daily Mansy, Jan A, MD      . traZODone (DESYREL) tablet 25 mg  25 mg Oral QHS PRN Mansy, Jan A, MD      . vitamin B-12 (CYANOCOBALAMIN) tablet 1,000 mcg  1,000 mcg Oral Daily Mansy, Jan A, MD      . vitamin E capsule 400 Units  400 Units Oral Daily Mansy, Jan A, MD   400 Units at 02/15/20 1019   Current Outpatient Medications  Medication Sig Dispense Refill  . acetaminophen (TYLENOL) 325 MG tablet Take 2 tablets (650 mg total) by mouth every 6 (six) hours as needed for mild pain (or Fever >/= 101). 100 tablet 0  . albuterol (PROVENTIL HFA;VENTOLIN HFA) 108 (90 BASE) MCG/ACT inhaler Inhale 2 puffs into the lungs every 6 (six) hours as needed for wheezing or shortness of breath.    . ALPRAZolam (XANAX) 0.5 MG tablet Take 1 tablet (0.5 mg total) by mouth 3 (three) times daily as needed for anxiety. 20 tablet 0  . buPROPion (WELLBUTRIN XL) 150 MG 24 hr tablet Take 150 mg by mouth daily.    . busPIRone (BUSPAR) 15 MG tablet Take 15 mg by mouth 2 (two) times daily.     . Calcium Carbonate-Vit D-Min (CALCIUM 600+D PLUS MINERALS) 600-400 MG-UNIT TABS Take 1 tablet by mouth daily.     . Carboxymethylcellul-Glycerin (LUBRICATING EYE DROPS OP) Place 1 drop into both eyes daily as needed (dry eyes).    . cholecalciferol (VITAMIN D3) 25 MCG (1000 UT) tablet Take 1,000 Units by mouth 2 (two) times a  day.    . citalopram (CELEXA) 40 MG tablet Take 40 mg by mouth daily.    . Cyanocobalamin (RA VITAMIN B-12 TR) 1000 MCG TBCR Take 1,000 mcg by mouth daily.     Marland Kitchen donepezil (ARICEPT) 10 MG tablet Take 10 mg by mouth at bedtime.     . fluticasone (FLONASE) 50 MCG/ACT nasal spray Place 2 sprays into both nostrils daily as needed for allergies.     . Fluticasone-Salmeterol (ADVAIR) 250-50 MCG/DOSE AEPB Inhale 1 puff into the lungs 2 (two) times daily as needed (shortness of breath).     . gabapentin (NEURONTIN) 300 MG capsule Take 300 mg by mouth 4 (four) times daily.    Marland Kitchen levothyroxine (SYNTHROID, LEVOTHROID) 50 MCG tablet Take 50 mcg by mouth daily before breakfast. Take 30 to 60 minutes before breakfast.    . memantine (NAMENDA) 10 MG tablet Take 10 mg by mouth daily.     . montelukast (SINGULAIR) 10 MG tablet Take 10 mg by mouth at bedtime.     . Multiple Vitamin (MULTI-VITAMIN) tablet Take 1 tablet by mouth daily.     . Multiple Vitamins-Minerals (PRESERVISION AREDS 2) CAPS Take 1 capsule by mouth 2 (two) times a day.    Marland Kitchen NARCAN 4 MG/0.1ML LIQD nasal spray kit Place 1 spray into the nose once.     Marland Kitchen oxybutynin (DITROPAN) 5 MG tablet Take 5 mg by mouth 3 (three) times daily.    Marland Kitchen oxyCODONE-acetaminophen (PERCOCET) 10-325 MG tablet Take 1 tablet by mouth every 6 (six) hours as needed for moderate pain  or severe pain.    . pantoprazole (PROTONIX) 20 MG tablet Take 20 mg by mouth 2 (two) times daily.     . pravastatin (PRAVACHOL) 20 MG tablet Take 20 mg by mouth daily.    . vitamin C (ASCORBIC ACID) 500 MG tablet Take 500 mg by mouth daily.    . vitamin E (E-400) 400 UNIT capsule Take 400 Units by mouth daily.      OBJECTIVE: Vitals:   02/15/20 1221 02/15/20 1524  BP: (!) 121/52 127/62  Pulse: (!) 59 60  Resp: 18 16  Temp: (!) 97.5 F (36.4 C) 97.8 F (36.6 C)  SpO2: 98% 100%     Body mass index is 20.18 kg/m.    ECOG FS:3 - Symptomatic, >50% confined to bed  General: Well-developed,  well-nourished, no acute distress. Eyes: Pink conjunctiva, anicteric sclera. HEENT: Normocephalic, moist mucous membranes. Lungs: No audible wheezing or coughing. Heart: Regular rate and rhythm. Abdomen: Soft, nontender, no obvious distention. Musculoskeletal: No edema, cyanosis, or clubbing. Neuro: Alert, mildly confused.  Cranial nerves grossly intact. Skin: No rashes or petechiae noted. Psych: Normal affect.  LAB RESULTS:  Lab Results  Component Value Date   NA 142 02/15/2020   K 4.2 02/15/2020   CL 113 (H) 02/15/2020   CO2 24 02/15/2020   GLUCOSE 90 02/15/2020   BUN 9 02/15/2020   CREATININE 0.77 02/15/2020   CALCIUM 8.7 (L) 02/15/2020   PROT 6.3 (L) 02/14/2020   ALBUMIN 3.9 02/14/2020   AST 24 02/14/2020   ALT 26 02/14/2020   ALKPHOS 61 02/14/2020   BILITOT 0.5 02/14/2020   GFRNONAA >60 02/15/2020   GFRAA >60 02/15/2020    Lab Results  Component Value Date   WBC 10.0 02/15/2020   NEUTROABS 6.4 01/12/2020   HGB 12.5 02/15/2020   HCT 37.6 02/15/2020   MCV 91.5 02/15/2020   PLT 10 (LL) 02/15/2020     STUDIES: No results found.  ASSESSMENT: Chronic ITP, failure to thrive.  PLAN:    1.  Chronic ITP: Patient typically receives 10 mcg/kg Nplate every 4 weeks and her next injection was scheduled for Friday, Feb 16, 2020.  Her platelet count was 9 upon admission and only increased to 10 with 1 unit platelets.  Patient received her Nplate earlier this afternoon.  Hold transfusion at this time unless patient exhibits evidence of bleeding.  If there is no increase in her platelet count in the next 1 to 2 days, will consider adding IVIG to her regimen.  Continue to monitor daily CBC. 2.  Failure to thrive: Secondary to grief from the recent death of her son. 3.  Dementia: Patient has mild dementia and poor memory, appears slightly worse than baseline.  Appreciate consult, will follow.   Lloyd Huger, MD   02/15/2020 4:58 PM

## 2020-02-15 NOTE — ED Notes (Signed)
After pt spouse left, pt became acutely more disoriented. Pt attempted to get out of bed and leave room, took off clothes, and wet the bed.   Pt changed/cleaned, redirected to bed, purewick placed, sitter at bedside

## 2020-02-15 NOTE — Progress Notes (Signed)
  PROGRESS NOTE    Ariana [name]  [id number] DOB: [birth year] DOA: 02/15/2020  PCP: Idelle Crouch, MD    LOS - 0    Patient admitted earlier this morning with clinical picture of failure to thrive since the loss of her son last week.  She has chronic ITP, followed by hematology, on Nplate.  In the ED, platelet count was 9k.  Hematology consulted.  Patient received one unit of platelets and given Nplate.  No active bleeding.  Interval subjective: Patient seen in ED on hold for a bed.  She says she's "not so good" but unable to give specific complaints.  Exam: no acute distress, heart sounds RRR, lungs CTAB, voluntary guarding on abdominal exam but not really tender, no rebound tenderness.  I have reviewed the full H&P by Dr. Sidney Ace in detail, and I agree with the assessment and plan as outlined therein. In addition: --Dr. Grayland Ormond, hematology, is following --platelet transfusions per hematology (if plts<10k or bleeding) --patient received 1st unit of platelets, then got Nplate injection.  Not actively bleeding, so per hematology, 2nd unit of platelets that had been ordered is held for now.   No Charge    Ezekiel Slocumb, DO [facility]   If 7PM-7AM, please contact night-coverage [email or web address] 02/15/2020, 4:50 PM

## 2020-02-15 NOTE — ED Provider Notes (Signed)
[facility] Emergency Department Provider Note  ____________________________________________   First MD Initiated Contact with Patient 02/15/20 [phone]     (approximate)  I have reviewed the triage vital signs and the nursing notes.   HISTORY  Chief Complaint Weakness   HPI Ariana [name] is a 86 y.o. female with below list of previous medical conditions including Lammers dementia, chronic ITP which patient receives Romiplostim presents to the emergency department via her husband secondary to increasing confusion very poor p.o. intake x1 week since the death of her son.  In addition the patient's husband admits that she did miss her last dose of romiplostim on Friday.  Patient denies any pain no nausea or vomiting diarrhea or fever.  Patient denies any cough.  Patient tearful on my arrival to the room.        Past Medical History:  Diagnosis Date  . Alzheimer disease ([place])   . Anemia   . Anxiety   . Asthma   . Chronic back pain   . Depression   . Depression   . GERD (gastroesophageal reflux disease)   . Hiatal hernia   . Hypercholesteremia   . ITP (idiopathic thrombocytopenic purpura)   . Osteoarthritis   . Osteoporosis   . Pulmonary emboli ([place])   . Restless legs     Patient Active Problem List   Diagnosis Date Noted  . Pneumonia 07/10/2019  . Laceration of left lower leg 04/10/2019  . Chronic pain disorder 01/11/2019  . OSA (obstructive sleep apnea) 09/28/2018  . Ulcer of lower limb, right, limited to breakdown of skin ([place]) 07/22/2018  . Traumatic hematoma of right lower leg 07/12/2018  . Asthma without status asthmaticus 11/25/2016  . Late onset Alzheimer's disease without behavioral disturbance ([place]) 12/30/2015  . Back muscle spasm 09/09/2015  . Clinical depression 04/12/2015  . Herpes zona 04/12/2015  . Chronic ITP (idiopathic thrombocytopenic purpura) (H[facility]) 03/15/2015  . Chest pain 10/22/2014  . Breathlessness on  exertion 10/22/2014  . Osteopenia 10/18/2014  . Benign essential hypertension 06/28/2014  . HLD (hyperlipidemia) 06/28/2014  . Idiopathic thrombocythemia ([place]) 06/28/2014  . Acquired hypothyroidism 06/04/2014  . Depressive disorder 06/04/2014  . Bursitis, trochanteric 03/27/2014  . DDD (degenerative disc disease), lumbar 02/27/2014  . Neuritis or radiculitis due to rupture of lumbar intervertebral disc 02/27/2014  . Lumbar radiculitis 02/27/2014    Past Surgical History:  Procedure Laterality Date  . CARPAL TUNNEL RELEASE    . ESOPHAGOGASTRODUODENOSCOPY (EGD) WITH PROPOFOL N/A 09/10/2015   Procedure: ESOPHAGOGASTRODUODENOSCOPY (EGD) WITH PROPOFOL;  Surgeon: Lollie Sails, MD;  Location: [facility];  Service: Endoscopy;  Laterality: N/A;  . IRRIGATION AND DEBRIDEMENT HEMATOMA Right 07/13/2018   Procedure: IRRIGATION AND DEBRIDEMENT HEMATOMA-RIGHT SHIN;  Surgeon: Herbert Pun, MD;  Location: [facility] ORS;  Service: General;  Laterality: Right;  . KYPHOPLASTY N/A 05/02/2019   Procedure: L1 KYPHOPLASTY;  Surgeon: Hessie Knows, MD;  Location: [facility] ORS;  Service: Orthopedics;  Laterality: N/A;  . LUMBAR [place]    . SPLENECTOMY, PARTIAL    . TOTAL ABDOMINAL HYSTERECTOMY      Prior to Admission medications   Medication Sig Start Date End Date Taking? Authorizing Provider  acetaminophen (TYLENOL) 325 MG tablet Take 2 tablets (650 mg total) by mouth every 6 (six) hours as needed for mild pain (or Fever >/= 101). 07/02/15   Idelle Crouch, MD  albuterol (PROVENTIL HFA;VENTOLIN HFA) 108 (90 BASE) MCG/ACT inhaler Inhale 2 puffs into the lungs every 6 (six) hours  as needed for wheezing or shortness of breath.    [provider]  ALPRAZolam Duanne Moron) 0.5 MG tablet Take 1 tablet (0.5 mg total) by mouth 3 (three) times daily as needed for anxiety. 07/15/18   Gladstone Lighter, MD  amoxicillin-clavulanate (AUGMENTIN) 500-125 MG tablet  12/11/19   [provider]   benzonatate (TESSALON) 200 MG capsule  12/11/19   [provider]  buPROPion (WELLBUTRIN XL) 150 MG 24 hr tablet Take 150 mg by mouth daily.    [provider]  busPIRone (BUSPAR) 15 MG tablet Take 15 mg by mouth 2 (two) times daily.  08/15/18   [provider]  Calcium Carbonate-Vit D-Min (CALCIUM 600+D PLUS MINERALS) 600-400 MG-UNIT TABS Take 1 tablet by mouth daily.     [provider]  Carboxymethylcellul-Glycerin (LUBRICATING EYE DROPS OP) Place 1 drop into both eyes daily as needed (dry eyes).    [provider]  cholecalciferol (VITAMIN D3) 25 MCG (1000 UT) tablet Take 1,000 Units by mouth 2 (two) times a day.    [provider]  citalopram (CELEXA) 40 MG tablet Take 40 mg by mouth daily.    [provider]  Cyanocobalamin (RA VITAMIN B-12 TR) 1000 MCG TBCR Take 1,000 mcg by mouth daily.     [provider]  cyclobenzaprine (FLEXERIL) 5 MG tablet Take 5 mg by mouth 3 (three) times daily as needed for muscle spasms. 04/26/19   [provider]  donepezil (ARICEPT) 10 MG tablet Take 10 mg by mouth at bedtime.  04/03/19 12/15/19  [provider]  fluticasone (FLONASE) 50 MCG/ACT nasal spray Place 2 sprays into both nostrils daily as needed for allergies.  03/03/17   [provider]  Fluticasone-Salmeterol (ADVAIR) 250-50 MCG/DOSE AEPB Inhale 1 puff into the lungs daily as needed (shortness of breath).     [provider]  gabapentin (NEURONTIN) 300 MG capsule Take 300 mg by mouth 4 (four) times daily.    [provider]  levothyroxine (SYNTHROID, LEVOTHROID) 50 MCG tablet Take 50 mcg by mouth daily before breakfast. Take 30 to 60 minutes before breakfast.    [provider]  memantine (NAMENDA) 10 MG tablet Take 10 mg by mouth daily.  04/03/19 12/15/19  [provider]  methylPREDNISolone (MEDROL DOSEPAK) 4 MG TBPK tablet  12/11/19   [provider]  montelukast  (SINGULAIR) 10 MG tablet Take 10 mg by mouth at bedtime.     [provider]  Multiple Vitamin (MULTI-VITAMIN) tablet Take 1 tablet by mouth daily.     [provider]  Multiple Vitamins-Minerals (PRESERVISION AREDS 2) CAPS Take 1 capsule by mouth 2 (two) times a day.    [provider]  NARCAN 4 MG/0.1ML LIQD nasal spray kit Place 1 spray into the nose once.  10/24/18   [provider]  nystatin (MYCOSTATIN) 100000 UNIT/ML suspension Take 5 mLs (500,000 Units total) by mouth 4 (four) times daily. 07/12/19   Mayo, Pete Pelt, MD  oxybutynin (DITROPAN) 5 MG tablet Take 5 mg by mouth 3 (three) times daily.    [provider]  pantoprazole (PROTONIX) 20 MG tablet Take 20 mg by mouth 2 (two) times daily.  07/24/15   [provider]  pravastatin (PRAVACHOL) 20 MG tablet Take 20 mg by mouth daily.    [provider]  vitamin C (ASCORBIC ACID) 500 MG tablet Take 500 mg by mouth daily.    [provider]  vitamin E (E-400) 400 UNIT capsule  Take 400 Units by mouth daily.    [provider]    Allergies Aspirin, Diazepam, Other, Tetanus toxoids, and Morphine  Family History  Problem Relation Age of Onset  . Stroke Mother     Social History Social History   Tobacco Use  . Smoking status: Never Smoker  . Smokeless tobacco: Never Used  Substance Use Topics  . Alcohol use: No    Alcohol/week: 0.0 standard drinks  . Drug use: No    Review of Systems Constitutional: No fever/chills Eyes: No visual changes. ENT: No sore throat. Cardiovascular: Denies chest pain. Respiratory: Denies shortness of breath. Gastrointestinal: No abdominal pain.  No nausea, no vomiting.  No diarrhea.  No constipation. Genitourinary: Negative for dysuria. Musculoskeletal: Negative for neck pain.  Negative for back pain. Integumentary: Negative for rash. Neurological: Negative for headaches, focal weakness or numbness. Psychiatric:  Positive for depressed mood  ____________________________________________   PHYSICAL EXAM:  VITAL SIGNS: ED Triage Vitals [02/14/20 2247]  Enc Vitals Group     BP (!) 126/52     Pulse Rate 79     Resp 20     Temp 97.6 F (36.4 C)     Temp Source Oral     SpO2 99 %     Weight 56.7 kg (125 lb)     Height      Head Circumference      Peak Flow      Pain Score      Pain Loc      Pain Edu?      Excl. in [place]?     Constitutional: Alert and oriented.  Eyes: Conjunctivae are normal.  Mouth/Throat: Dry oral mucosa Neck: No stridor.  No meningeal signs.   Cardiovascular: Normal rate, regular rhythm. Good peripheral circulation. Grossly normal heart sounds. Respiratory: Normal respiratory effort.  No retractions. Gastrointestinal: Soft and nontender. No distention.  Musculoskeletal: No lower extremity tenderness nor edema. No gross deformities of extremities. Neurologic:  Normal speech and language. No gross focal neurologic deficits are appreciated.  Skin:  Skin is warm, dry and intact. Psychiatric: Depressed mood speech and behavior are normal.  ____________________________________________   LABS (all labs ordered are listed, but only abnormal results are displayed)  Labs Reviewed  GLUCOSE, CAPILLARY - Abnormal; Notable for the following components:      Result Value   Glucose-Capillary 65 (*)    All other components within normal limits  CBC - Abnormal; Notable for the following components:   WBC 11.3 (*)    RDW 17.1 (*)    Platelets 9 (*)    All other components within normal limits  COMPREHENSIVE METABOLIC PANEL - Abnormal; Notable for the following components:   Total Protein 6.3 (*)    All other components within normal limits  RESPIRATORY PANEL BY RT PCR (FLU A&B, COVID)  GLUCOSE, CAPILLARY  URINALYSIS, COMPLETE (UACMP) WITH MICROSCOPIC  CBG MONITORING, ED  TYPE AND SCREEN  PREPARE PLATELET PHERESIS       Procedures   ____________________________________________   INITIAL IMPRESSION / MDM / ASSESSMENT AND PLAN / ED COURSE  As part of my medical decision making, I reviewed the following data within the electronic MEDICAL RECORD NUMBER  86 year old female presented with above-stated history and physical exam with differential diagnosis including but not limited to adjustment disorder, depression, worsening thrombocytopenia given reported history of missed romiplostim dose clinically patient is dehydrated.  Laboratory data revealed a platelet count of 9.  A such patient discussed with Dr.  Finnegan with plan to administer romiplostim however the pharmacy does not have this available and as such patient will be given platelets with thromboplastin administration in the morning via the cancer center.  Patient did receive 1 L IV normal saline secondary to clinical apparent dehydration.  Patient discussed with Dr. Danella Penton for hospital admission for observation. ____________________________________________  FINAL CLINICAL IMPRESSION(S) / ED DIAGNOSES  Final diagnoses:  Thrombocytopenia ([place])  Depression   MEDICATIONS GIVEN DURING THIS VISIT:  Medications  0.9 %  sodium chloride infusion (has no administration in time range)  romiPLOStim (NPLATE) injection 570 mcg (has no administration in time range)  sodium chloride 0.9 % bolus 1,000 mL (1,000 mLs Intravenous New Bag/Given 02/15/20 0235)     ED Discharge Orders    None      *Please note:  Ariana [name] was evaluated in Emergency Department on 02/15/2020 for the symptoms described in the history of present illness. She was evaluated in the context of the global COVID-19 pandemic, which necessitated consideration that the patient might be at risk for infection with the SARS-CoV-2 virus that causes COVID-19. Institutional protocols and algorithms that pertain to the evaluation of patients at risk for COVID-19 are in a state of rapid  change based on information released by regulatory bodies including the CDC and federal and state organizations. These policies and algorithms were followed during the patient's care in the ED.  Some ED evaluations and interventions may be delayed as a result of limited staffing during the pandemic.*  Note:  This document was prepared using Dragon voice recognition software and may include unintentional dictation errors.   Gregor Hams, MD 02/15/20 [phone]

## 2020-02-16 ENCOUNTER — Inpatient Hospital Stay: Payer: Medicare HMO

## 2020-02-16 ENCOUNTER — Inpatient Hospital Stay: Payer: Medicare HMO | Admitting: Oncology

## 2020-02-16 DIAGNOSIS — G894 Chronic pain syndrome: Secondary | ICD-10-CM | POA: Diagnosis not present

## 2020-02-16 DIAGNOSIS — G8929 Other chronic pain: Secondary | ICD-10-CM | POA: Diagnosis present

## 2020-02-16 DIAGNOSIS — Z79899 Other long term (current) drug therapy: Secondary | ICD-10-CM | POA: Diagnosis not present

## 2020-02-16 DIAGNOSIS — J45909 Unspecified asthma, uncomplicated: Secondary | ICD-10-CM | POA: Diagnosis present

## 2020-02-16 DIAGNOSIS — M1990 Unspecified osteoarthritis, unspecified site: Secondary | ICD-10-CM | POA: Diagnosis present

## 2020-02-16 DIAGNOSIS — E785 Hyperlipidemia, unspecified: Secondary | ICD-10-CM | POA: Diagnosis present

## 2020-02-16 DIAGNOSIS — M5136 Other intervertebral disc degeneration, lumbar region: Secondary | ICD-10-CM | POA: Diagnosis present

## 2020-02-16 DIAGNOSIS — F4322 Adjustment disorder with anxiety: Secondary | ICD-10-CM | POA: Diagnosis present

## 2020-02-16 DIAGNOSIS — G2581 Restless legs syndrome: Secondary | ICD-10-CM | POA: Diagnosis present

## 2020-02-16 DIAGNOSIS — Z86711 Personal history of pulmonary embolism: Secondary | ICD-10-CM | POA: Diagnosis not present

## 2020-02-16 DIAGNOSIS — E039 Hypothyroidism, unspecified: Secondary | ICD-10-CM | POA: Diagnosis present

## 2020-02-16 DIAGNOSIS — G4733 Obstructive sleep apnea (adult) (pediatric): Secondary | ICD-10-CM | POA: Diagnosis present

## 2020-02-16 DIAGNOSIS — D6959 Other secondary thrombocytopenia: Secondary | ICD-10-CM | POA: Diagnosis present

## 2020-02-16 DIAGNOSIS — K219 Gastro-esophageal reflux disease without esophagitis: Secondary | ICD-10-CM | POA: Diagnosis present

## 2020-02-16 DIAGNOSIS — R627 Adult failure to thrive: Secondary | ICD-10-CM | POA: Diagnosis present

## 2020-02-16 DIAGNOSIS — F0281 Dementia in other diseases classified elsewhere with behavioral disturbance: Secondary | ICD-10-CM | POA: Diagnosis present

## 2020-02-16 DIAGNOSIS — E860 Dehydration: Secondary | ICD-10-CM | POA: Diagnosis present

## 2020-02-16 DIAGNOSIS — G301 Alzheimer's disease with late onset: Secondary | ICD-10-CM | POA: Diagnosis present

## 2020-02-16 DIAGNOSIS — K449 Diaphragmatic hernia without obstruction or gangrene: Secondary | ICD-10-CM | POA: Diagnosis present

## 2020-02-16 DIAGNOSIS — I10 Essential (primary) hypertension: Secondary | ICD-10-CM | POA: Diagnosis present

## 2020-02-16 DIAGNOSIS — F329 Major depressive disorder, single episode, unspecified: Secondary | ICD-10-CM | POA: Diagnosis present

## 2020-02-16 DIAGNOSIS — M810 Age-related osteoporosis without current pathological fracture: Secondary | ICD-10-CM | POA: Diagnosis present

## 2020-02-16 DIAGNOSIS — D696 Thrombocytopenia, unspecified: Secondary | ICD-10-CM | POA: Diagnosis present

## 2020-02-16 DIAGNOSIS — E7800 Pure hypercholesterolemia, unspecified: Secondary | ICD-10-CM | POA: Diagnosis present

## 2020-02-16 DIAGNOSIS — D693 Immune thrombocytopenic purpura: Secondary | ICD-10-CM | POA: Diagnosis present

## 2020-02-16 DIAGNOSIS — Z20822 Contact with and (suspected) exposure to covid-19: Secondary | ICD-10-CM | POA: Diagnosis present

## 2020-02-16 LAB — CBC WITH DIFFERENTIAL/PLATELET
Abs Immature Granulocytes: 0.04 10*3/uL (ref 0.00–0.07)
Basophils Absolute: 0.1 10*3/uL (ref 0.0–0.1)
Basophils Relative: 1 %
Eosinophils Absolute: 0.4 10*3/uL (ref 0.0–0.5)
Eosinophils Relative: 5 %
HCT: 37.6 % (ref 36.0–46.0)
Hemoglobin: 12.8 g/dL (ref 12.0–15.0)
Immature Granulocytes: 0 %
Lymphocytes Relative: 32 %
Lymphs Abs: 2.9 10*3/uL (ref 0.7–4.0)
MCH: 30.4 pg (ref 26.0–34.0)
MCHC: 34 g/dL (ref 30.0–36.0)
MCV: 89.3 fL (ref 80.0–100.0)
Monocytes Absolute: 1.1 10*3/uL — ABNORMAL HIGH (ref 0.1–1.0)
Monocytes Relative: 12 %
Neutro Abs: 4.6 10*3/uL (ref 1.7–7.7)
Neutrophils Relative %: 50 %
Platelets: 11 10*3/uL — CL (ref 150–400)
RBC: 4.21 MIL/uL (ref 3.87–5.11)
RDW: 17.2 % — ABNORMAL HIGH (ref 11.5–15.5)
WBC: 9.2 10*3/uL (ref 4.0–10.5)
nRBC: 0 % (ref 0.0–0.2)

## 2020-02-16 LAB — URINALYSIS, COMPLETE (UACMP) WITH MICROSCOPIC
Bacteria, UA: NONE SEEN
Bilirubin Urine: NEGATIVE
Glucose, UA: NEGATIVE mg/dL
Hgb urine dipstick: NEGATIVE
Ketones, ur: NEGATIVE mg/dL
Leukocytes,Ua: NEGATIVE
Nitrite: NEGATIVE
Protein, ur: NEGATIVE mg/dL
Specific Gravity, Urine: 1.005 (ref 1.005–1.030)
Squamous Epithelial / HPF: NONE SEEN (ref 0–5)
pH: 7 (ref 5.0–8.0)

## 2020-02-16 LAB — BPAM PLATELET PHERESIS
Blood Product Expiration Date: 202105082359
ISSUE DATE / TIME: 202105060555
Unit Type and Rh: 8400

## 2020-02-16 LAB — PREPARE PLATELET PHERESIS: Unit division: 0

## 2020-02-16 MED ORDER — POLYETHYLENE GLYCOL 3350 17 G PO PACK
17.0000 g | PACK | Freq: Every day | ORAL | Status: DC
  Administered 2020-02-16 – 2020-02-18 (×3): 17 g via ORAL
  Filled 2020-02-16 (×3): qty 1

## 2020-02-16 MED ORDER — BISACODYL 5 MG PO TBEC: 5.0000 mg | DELAYED_RELEASE_TABLET | Freq: Every day | ORAL | Status: DC | PRN

## 2020-02-16 MED ORDER — ENSURE ENLIVE PO LIQD
237.0000 mL | Freq: Two times a day (BID) | ORAL | Status: DC
  Administered 2020-02-17 – 2020-02-18 (×3): 237 mL via ORAL

## 2020-02-16 NOTE — Plan of Care (Signed)
  Problem: Education: Goal: Knowledge of General Education information will improve Description Including pain rating scale, medication(s)/side effects and non-pharmacologic comfort measures Outcome: Progressing   Problem: Health Behavior/Discharge Planning: Goal: Ability to manage health-related needs will improve Outcome: Progressing   

## 2020-02-16 NOTE — Hospital Course (Signed)
Ariana [name]  is a 86 y.o. Caucasian female with a history of chronic ITP on romiplostim (Nplate), Alzheimer's dementia, asthma, depression and dyslipidemia, who presented to the ED on 5/6 with failure to thrive, not eating or drinking since loss of her son two weeks ago.  There were no other specific complaints.  In the ED, BP was 126/52 with otherwise normal vital signs.  Labs were remarkable for platelets of 9k down from 24 on 01/12/2020.  On chart review, highest platelet count was was 40k on 09/22/2019.  COVID-19 PCR and influenza antigens came back negative.  Dr. Grayland Ormond was contacted in the ED, recommended giving Nplate which was not able to be obtained in the middle of the night at time of admission.  Patient was therefore transfused 1 unit platelets, with improved platelet count to 10k.  Nplate given later in the day on 5/6.  No active bleeding.

## 2020-02-16 NOTE — Progress Notes (Signed)
PROGRESS NOTE    Ariana [name]   [id number]  DOB: 1933/02/24  PCP: Idelle Crouch, MD    DOA: 02/15/2020 LOS: 0   Brief Narrative   Ariana [name]  is a 86 y.o. Caucasian female with a history of chronic ITP on romiplostim (Nplate), Alzheimer's dementia, asthma, depression and dyslipidemia, who presented to the ED on 5/6 with failure to thrive, not eating or drinking since loss of her son two weeks ago.  There were no other specific complaints.  In the ED, BP was 126/52 with otherwise normal vital signs.  Labs were remarkable for platelets of 9k down from 24 on 01/12/2020.  On chart review, highest platelet count was was 40k on 09/22/2019.  COVID-19 PCR and influenza antigens came back negative.  Dr. Grayland Ormond was contacted in the ED, recommended giving Nplate which was not able to be obtained in the middle of the night at time of admission.  Patient was therefore transfused 1 unit platelets, with improved platelet count to 10k.  Nplate given later in the day on 5/6.  No active bleeding.     Assessment & Plan   Active Problems:   Thrombocytopenia (HCC)  Severe thrombocytopenia in the setting of chronic ITP - s/p NPlate injection and 1 unit platelets transfused on 5/6.   --Dr. Grayland Ormond following --transfuse platelets if below 10k or active bleeding --monitor CBC, monitor for bleeding  Failure to Thrive / Grief Reaction - patient not eating/drinking since son died two weeks ago. --continue maintenance IV fluids --Ensures BID between meals --encourage PO intake  Depression and anxiety - currently exacerbated by loss of her son. -We will continue Wellbutrin XL and BuSpar.  Dementia with behavioral disturbance -  --Continue Aricept and Namenda  Hypothyroidism - continue Synthroid.  TSH with AM labs.  Asthma - chronic, stable, not acutely exacerbated.   --continue Singulair and Advair Diskus  Patient BMI: Body mass index is 20.18 kg/m.   DVT prophylaxis:  SCD's  Diet:  Diet Orders (From admission, onward)    Start     Ordered   02/15/20 0410  Diet Heart Room service appropriate? Yes; Fluid consistency: Thin  Diet effective now    Question Answer Comment  Room service appropriate? Yes   Fluid consistency: Thin      02/15/20 0414            Code Status: Full Code    Subjective 02/16/20    Patient seen this AM at bedside, pleasant and interactive.  She received Tylenol earlier in the AM for a headache, patient states it helped.  Sitter present in room, as patient was agitated yesterday evening.  No further issues reported this morning.  Patient denies pain or other complaints.   Disposition Plan & Communication   Status is: Inpatient  Remains inpatient appropriate because:severe thrombocytopenia requires close monitoring in hospital setting given high risk of acute bleeding and possible need for transfusions. Anticipate d/c home pending clearance by heme/onc and stable platelets without bleeding.  Dispo: The patient is from: Home              Anticipated d/c is to: Home              Anticipated d/c date is: 2 days              Patient currently is not medically stable to d/c.   Family Communication: none at bedside, will attempt to call    Consults, Procedures, Significant Events  Consultants:   Hematology/Oncology  Procedures:   Transfusion of platelets, 1 unit on 02/15/20  Antimicrobials:   none    Objective   Vitals:   02/15/20 1524 02/15/20 1745 02/15/20 2312 02/16/20 0428  BP: 127/62 (!) 157/70 (!) 120/97 115/83  Pulse: 60 67 65 69  Resp: 16 18  20   Temp: 97.8 F (36.6 C) 97.7 F (36.5 C)  98 F (36.7 C)  TempSrc: Oral Oral    SpO2: 100% 100% 99% 100%  Weight:        Intake/Output Summary (Last 24 hours) at 02/16/2020 1309 Last data filed at 02/16/2020 1048 Gross per 24 hour  Intake 1227.63 ml  Output 1100 ml  Net 127.63 ml   Filed Weights   02/14/20 2247  Weight: 56.7 kg    Physical  Exam:  General exam: awake, alert, no acute distress Respiratory system: CTAB, no wheezes, rales or rhonchi, normal respiratory effort. Cardiovascular system: normal S1/S2, RRR, no pedal edema.   Central nervous system: no gross focal neurologic deficits, normal speech Extremities: scattered patches on ecchymosis on all extremities, no edema, normal tone Skin: dry, intact, normal temperature, ecchymosis on extremities and patch on anterior middle chest under telemetry lead sticker, no active bleeding Psychiatry: normal mood, congruent affect, judgement and insight appear normal  Labs   Data Reviewed: I have personally reviewed following labs and imaging studies  CBC: Recent Labs  Lab 02/14/20 2253 02/15/20 1245 02/16/20 0422  WBC 11.3* 10.0 9.2  NEUTROABS  --   --  4.6  HGB 14.3 12.5 12.8  HCT 42.3 37.6 37.6  MCV 89.6 91.5 89.3  PLT 9* 10* 11*   Basic Metabolic Panel: Recent Labs  Lab 02/14/20 2253 02/15/20 1245  NA 136 142  K 3.7 4.2  CL 103 113*  CO2 27 24  GLUCOSE 85 90  BUN 14 9  CREATININE 0.85 0.77  CALCIUM 9.0 8.7*  MG  --  2.1   GFR: CrCl cannot be calculated (Unknown ideal weight.). Liver Function Tests: Recent Labs  Lab 02/14/20 2253  AST 24  ALT 26  ALKPHOS 61  BILITOT 0.5  PROT 6.3*  ALBUMIN 3.9   No results for input(s): LIPASE, AMYLASE in the last 168 hours. No results for input(s): AMMONIA in the last 168 hours. Coagulation Profile: No results for input(s): INR, PROTIME in the last 168 hours. Cardiac Enzymes: No results for input(s): CKTOTAL, CKMB, CKMBINDEX, TROPONINI in the last 168 hours. BNP (last 3 results) No results for input(s): PROBNP in the last 8760 hours. HbA1C: No results for input(s): HGBA1C in the last 72 hours. CBG: Recent Labs  Lab 02/14/20 2249 02/15/20 0054  GLUCAP 65* 95   Lipid Profile: No results for input(s): CHOL, HDL, LDLCALC, TRIG, CHOLHDL, LDLDIRECT in the last 72 hours. Thyroid Function Tests: No  results for input(s): TSH, T4TOTAL, FREET4, T3FREE, THYROIDAB in the last 72 hours. Anemia Panel: No results for input(s): VITAMINB12, FOLATE, FERRITIN, TIBC, IRON, RETICCTPCT in the last 72 hours. Sepsis Labs: No results for input(s): PROCALCITON, LATICACIDVEN in the last 168 hours.  Recent Results (from the past 240 hour(s))  Respiratory Panel by RT PCR (Flu A&B, Covid) - Nasopharyngeal Swab     Status: None   Collection Time: 02/15/20  3:22 AM   Specimen: Nasopharyngeal Swab  Result Value Ref Range Status   SARS Coronavirus 2 by RT PCR NEGATIVE NEGATIVE Final    Comment: (NOTE) SARS-CoV-2 target nucleic acids are NOT DETECTED. The SARS-CoV-2 RNA is  generally detectable in upper respiratoy specimens during the acute phase of infection. The lowest concentration of SARS-CoV-2 viral copies this assay can detect is 131 copies/mL. A negative result does not preclude SARS-Cov-2 infection and should not be used as the sole basis for treatment or other patient management decisions. A negative result may occur with  improper specimen collection/handling, submission of specimen other than nasopharyngeal swab, presence of viral mutation(s) within the areas targeted by this assay, and inadequate number of viral copies (<131 copies/mL). A negative result must be combined with clinical observations, patient history, and epidemiological information. The expected result is Negative. Fact Sheet for Patients:  PinkCheek.be Fact Sheet for Healthcare Providers:  GravelBags.it This test is not yet ap proved or cleared by the [place] FDA and  has been authorized for detection and/or diagnosis of SARS-CoV-2 by FDA under an Emergency Use Authorization (EUA). This EUA will remain  in effect (meaning this test can be used) for the duration of the COVID-19 declaration under Section 564(b)(1) of the Act, 21 U.S.C. section 360bbb-3(b)(1), unless  the authorization is terminated or revoked sooner.    Influenza A by PCR NEGATIVE NEGATIVE Final   Influenza B by PCR NEGATIVE NEGATIVE Final    Comment: (NOTE) The Xpert Xpress SARS-CoV-2/FLU/RSV assay is intended as an aid in  the diagnosis of influenza from Nasopharyngeal swab specimens and  should not be used as a sole basis for treatment. Nasal washings and  aspirates are unacceptable for Xpert Xpress SARS-CoV-2/FLU/RSV  testing. Fact Sheet for Patients: PinkCheek.be Fact Sheet for Healthcare Providers: GravelBags.it This test is not yet approved or cleared by the [place] FDA and  has been authorized for detection and/or diagnosis of SARS-CoV-2 by  FDA under an Emergency Use Authorization (EUA). This EUA will remain  in effect (meaning this test can be used) for the duration of the  Covid-19 declaration under Section 564(b)(1) of the Act, 21  U.S.C. section 360bbb-3(b)(1), unless the authorization is  terminated or revoked. Performed at [facility], [name] [place], [place] [address]       Imaging Studies   No results found.   Medications   Scheduled Meds: . vitamin C  500 mg Oral Daily  . buPROPion  150 mg Oral Daily  . busPIRone  15 mg Oral BID  . calcium-vitamin D   Oral Daily  . cholecalciferol  1,000 Units Oral Daily  . donepezil  10 mg Oral QHS  . levothyroxine  50 mcg Oral QAC breakfast  . memantine  10 mg Oral Daily  . mometasone-formoterol  2 puff Inhalation BID  . montelukast  10 mg Oral QHS  . multivitamin with minerals  1 tablet Oral Daily  . multivitamin-lutein  1 capsule Oral Daily  . oxybutynin  5 mg Oral TID  . pantoprazole  40 mg Oral Daily  . pravastatin  20 mg Oral Daily  . vitamin B-12  1,000 mcg Oral Daily  . vitamin E  400 Units Oral Daily   Continuous Infusions: . sodium chloride Stopped (02/15/20 1233)  . sodium chloride 75 mL/hr at 02/16/20 0725  .  sodium chloride 10 mL/hr at 02/15/20 1250       LOS: 0 days    Time spent: 30 minutes    Ezekiel Slocumb, DO [facility]  02/16/2020, 1:09 PM    If 7PM-7AM, please contact night-coverage. How to contact the [facility] Attending or Consulting provider [place] or covering provider during after hours  7P -7A, for this patient?    1. Check the care team in [facility] and look for a) attending/consulting [facility] provider listed and b) the [facility] team listed 2. Log into [email or web address] and use [place]'s universal password to access. If you do not have the password, please contact the hospital operator. 3. Locate the [facility] provider you are looking for under [facility] and page to a number that you can be directly reached. 4. If you still have difficulty reaching the provider, please page the [facility] (Director on Call) for the Hospitalists listed on amion for assistance.

## 2020-02-17 LAB — CBC WITH DIFFERENTIAL/PLATELET
Abs Immature Granulocytes: 0.03 10*3/uL (ref 0.00–0.07)
Basophils Absolute: 0.1 10*3/uL (ref 0.0–0.1)
Basophils Relative: 1 %
Eosinophils Absolute: 0.4 10*3/uL (ref 0.0–0.5)
Eosinophils Relative: 4 %
HCT: 39.8 % (ref 36.0–46.0)
Hemoglobin: 13.4 g/dL (ref 12.0–15.0)
Immature Granulocytes: 0 %
Lymphocytes Relative: 32 %
Lymphs Abs: 3 10*3/uL (ref 0.7–4.0)
MCH: 30.4 pg (ref 26.0–34.0)
MCHC: 33.7 g/dL (ref 30.0–36.0)
MCV: 90.2 fL (ref 80.0–100.0)
Monocytes Absolute: 1 10*3/uL (ref 0.1–1.0)
Monocytes Relative: 11 %
Neutro Abs: 4.7 10*3/uL (ref 1.7–7.7)
Neutrophils Relative %: 52 %
Platelets: 12 10*3/uL — CL (ref 150–400)
RBC: 4.41 MIL/uL (ref 3.87–5.11)
RDW: 16.7 % — ABNORMAL HIGH (ref 11.5–15.5)
WBC: 9.1 10*3/uL (ref 4.0–10.5)
nRBC: 0 % (ref 0.0–0.2)

## 2020-02-17 LAB — TSH: TSH: 1.856 u[IU]/mL (ref 0.350–4.500)

## 2020-02-17 NOTE — Progress Notes (Signed)
Pt becoming agressive/combative with sitter.  HR:137.  Pt with agitation. MD notified

## 2020-02-17 NOTE — Progress Notes (Signed)
Tele notified pt with SVT, 12 beats, HR:144. Current HR:99. MD notified. No new orders in place.

## 2020-02-17 NOTE — Progress Notes (Signed)
PROGRESS NOTE    Ariana [name]   [id number]  DOB: 1933/07/01  PCP: Idelle Crouch, MD    DOA: 02/15/2020 LOS: 1   Brief Narrative   Ariana [name]  is a 86 y.o. Caucasian female with a history of chronic ITP on romiplostim (Nplate), Alzheimer's dementia, asthma, depression and dyslipidemia, who presented to the ED on 5/6 with failure to thrive, not eating or drinking since loss of her son two weeks ago.  There were no other specific complaints.  In the ED, BP was 126/52 with otherwise normal vital signs.  Labs were remarkable for platelets of 9k down from 24 on 01/12/2020.  On chart review, highest platelet count was was 40k on 09/22/2019.  COVID-19 PCR and influenza antigens came back negative.  Dr. Grayland Ormond was contacted in the ED, recommended giving Nplate which was not able to be obtained in the middle of the night at time of admission.  Patient was therefore transfused 1 unit platelets, with improved platelet count to 10k.  Nplate given later in the day on 5/6.  No active bleeding.      Assessment & Plan   Principal Problem:   Thrombocytopenia (HCC) Active Problems:   Failure to thrive in adult   Chronic ITP (idiopathic thrombocytopenic purpura) (HCC)   Acquired hypothyroidism   DDD (degenerative disc disease), lumbar   Benign essential hypertension   HLD (hyperlipidemia)   Late onset Alzheimer's disease without behavioral disturbance (HCC)   OSA (obstructive sleep apnea)   Chronic pain disorder  Severe thrombocytopenia in the setting of chronic ITP - s/p NPlate injection and 1 unit platelets transfused on 5/6.   --Dr. Grayland Ormond following: if platelet trends up and no bleeding, can d/c on 5/9 --transfuse platelets if below 10k or active bleeding --monitor CBC, monitor for bleeding  Failure to Thrive / Grief Reaction - patient not eating/drinking since son died two weeks ago. --continue maintenance IV fluids --Ensures BID between meals --encourage PO  intake  Depression and anxiety - currently exacerbated by loss of her son. -We will continue Wellbutrin XL and BuSpar.  Dementia with behavioral disturbance -  --Continue Aricept and Namenda  Hypothyroidism - continue Synthroid.  TSH with AM labs.  Asthma - chronic, stable, not acutely exacerbated.   --continue Singulair and Advair Diskus  Patient BMI: Body mass index is 20.18 kg/m.   DVT prophylaxis: SCD's  Diet:  Diet Orders (From admission, onward)    Start     Ordered   02/15/20 0410  Diet Heart Room service appropriate? Yes; Fluid consistency: Thin  Diet effective now    Question Answer Comment  Room service appropriate? Yes   Fluid consistency: Thin      02/15/20 0414            Code Status: Full Code    Subjective 02/17/20    Patient seen this AM at bedside, pleasant and interactive. Husband at bedside today.  Patient reports feeling well, asks if she can go home today.  No fevers, chills, bleeding, pain or discomfort.  No acute events reported.   Disposition Plan & Communication   Status is: Inpatient  Remains inpatient appropriate because:severe thrombocytopenia requires close monitoring in hospital setting given high risk of acute bleeding and possible need for transfusions. Anticipate d/c home tomorrow pending stable/rising platelets without bleeding.  Dispo: The patient is from: Home              Anticipated d/c is to: Home  Anticipated d/c date is: 1 day (5/9)              Patient currently is not medically stable to d/c.   Family Communication: husband at bedside during encounter, updated, questions answered, and he is agreeable with the plan.   Consults, Procedures, Significant Events   Consultants:   Hematology/Oncology  Procedures:   Transfusion of platelets, 1 unit on 02/15/20  Antimicrobials:   none    Objective   Vitals:   02/15/20 1745 02/15/20 2312 02/16/20 0428 02/16/20 1947  BP: (!) 157/70 (!) 120/97 115/83  133/74  Pulse: 67 65 69 65  Resp: 18  20 16   Temp: 97.7 F (36.5 C)  98 F (36.7 C) 97.6 F (36.4 C)  TempSrc: Oral   Oral  SpO2: 100% 99% 100% 100%  Weight:        Intake/Output Summary (Last 24 hours) at 02/17/2020 0744 Last data filed at 02/17/2020 0616 Gross per 24 hour  Intake 2812.83 ml  Output --  Net 2812.83 ml   Filed Weights   02/14/20 2247  Weight: 56.7 kg    Physical Exam:  General exam: awake, alert, no acute distress Respiratory system: CTAB, no wheezes, rales or rhonchi, normal respiratory effort. Cardiovascular system: normal S1/S2, RRR, no pedal edema.   Skin: dry, intact, normal temperature, ecchymosis on extremities and patch on anterior middle chest under telemetry lead sticker, no active bleeding  Labs   Data Reviewed: I have personally reviewed following labs and imaging studies  CBC: Recent Labs  Lab 02/14/20 2253 02/15/20 1245 02/16/20 0422 02/17/20 0422  WBC 11.3* 10.0 9.2 9.1  NEUTROABS  --   --  4.6 4.7  HGB 14.3 12.5 12.8 13.4  HCT 42.3 37.6 37.6 39.8  MCV 89.6 91.5 89.3 90.2  PLT 9* 10* 11* 12*   Basic Metabolic Panel: Recent Labs  Lab 02/14/20 2253 02/15/20 1245  NA 136 142  K 3.7 4.2  CL 103 113*  CO2 27 24  GLUCOSE 85 90  BUN 14 9  CREATININE 0.85 0.77  CALCIUM 9.0 8.7*  MG  --  2.1   GFR: CrCl cannot be calculated (Unknown ideal weight.). Liver Function Tests: Recent Labs  Lab 02/14/20 2253  AST 24  ALT 26  ALKPHOS 61  BILITOT 0.5  PROT 6.3*  ALBUMIN 3.9   No results for input(s): LIPASE, AMYLASE in the last 168 hours. No results for input(s): AMMONIA in the last 168 hours. Coagulation Profile: No results for input(s): INR, PROTIME in the last 168 hours. Cardiac Enzymes: No results for input(s): CKTOTAL, CKMB, CKMBINDEX, TROPONINI in the last 168 hours. BNP (last 3 results) No results for input(s): PROBNP in the last 8760 hours. HbA1C: No results for input(s): HGBA1C in the last 72 hours. CBG: Recent  Labs  Lab 02/14/20 2249 02/15/20 0054  GLUCAP 65* 95   Lipid Profile: No results for input(s): CHOL, HDL, LDLCALC, TRIG, CHOLHDL, LDLDIRECT in the last 72 hours. Thyroid Function Tests: Recent Labs    02/17/20 0422  TSH 1.856   Anemia Panel: No results for input(s): VITAMINB12, FOLATE, FERRITIN, TIBC, IRON, RETICCTPCT in the last 72 hours. Sepsis Labs: No results for input(s): PROCALCITON, LATICACIDVEN in the last 168 hours.  Recent Results (from the past 240 hour(s))  Respiratory Panel by RT PCR (Flu A&B, Covid) - Nasopharyngeal Swab     Status: None   Collection Time: 02/15/20  3:22 AM   Specimen: Nasopharyngeal Swab  Result Value Ref Range  Status   SARS Coronavirus 2 by RT PCR NEGATIVE NEGATIVE Final    Comment: (NOTE) SARS-CoV-2 target nucleic acids are NOT DETECTED. The SARS-CoV-2 RNA is generally detectable in upper respiratoy specimens during the acute phase of infection. The lowest concentration of SARS-CoV-2 viral copies this assay can detect is 131 copies/mL. A negative result does not preclude SARS-Cov-2 infection and should not be used as the sole basis for treatment or other patient management decisions. A negative result may occur with  improper specimen collection/handling, submission of specimen other than nasopharyngeal swab, presence of viral mutation(s) within the areas targeted by this assay, and inadequate number of viral copies (<131 copies/mL). A negative result must be combined with clinical observations, patient history, and epidemiological information. The expected result is Negative. Fact Sheet for Patients:  PinkCheek.be Fact Sheet for Healthcare Providers:  GravelBags.it This test is not yet ap proved or cleared by the [place] FDA and  has been authorized for detection and/or diagnosis of SARS-CoV-2 by FDA under an Emergency Use Authorization (EUA). This EUA will remain  in effect  (meaning this test can be used) for the duration of the COVID-19 declaration under Section 564(b)(1) of the Act, 21 U.S.C. section 360bbb-3(b)(1), unless the authorization is terminated or revoked sooner.    Influenza A by PCR NEGATIVE NEGATIVE Final   Influenza B by PCR NEGATIVE NEGATIVE Final    Comment: (NOTE) The Xpert Xpress SARS-CoV-2/FLU/RSV assay is intended as an aid in  the diagnosis of influenza from Nasopharyngeal swab specimens and  should not be used as a sole basis for treatment. Nasal washings and  aspirates are unacceptable for Xpert Xpress SARS-CoV-2/FLU/RSV  testing. Fact Sheet for Patients: PinkCheek.be Fact Sheet for Healthcare Providers: GravelBags.it This test is not yet approved or cleared by the [place] FDA and  has been authorized for detection and/or diagnosis of SARS-CoV-2 by  FDA under an Emergency Use Authorization (EUA). This EUA will remain  in effect (meaning this test can be used) for the duration of the  Covid-19 declaration under Section 564(b)(1) of the Act, 21  U.S.C. section 360bbb-3(b)(1), unless the authorization is  terminated or revoked. Performed at [facility], [address], [place], [place] [address]       Imaging Studies   No results found.   Medications   Scheduled Meds: . vitamin C  500 mg Oral Daily  . buPROPion  150 mg Oral Daily  . busPIRone  15 mg Oral BID  . calcium-vitamin D   Oral Daily  . cholecalciferol  1,000 Units Oral Daily  . donepezil  10 mg Oral QHS  . feeding supplement (ENSURE ENLIVE)  237 mL Oral BID BM  . levothyroxine  50 mcg Oral QAC breakfast  . memantine  10 mg Oral Daily  . mometasone-formoterol  2 puff Inhalation BID  . montelukast  10 mg Oral QHS  . multivitamin with minerals  1 tablet Oral Daily  . multivitamin-lutein  1 capsule Oral Daily  . oxybutynin  5 mg Oral TID  . pantoprazole  40 mg Oral Daily  .  polyethylene glycol  17 g Oral Daily  . pravastatin  20 mg Oral Daily  . vitamin B-12  1,000 mcg Oral Daily  . vitamin E  400 Units Oral Daily   Continuous Infusions: . sodium chloride Stopped (02/15/20 1233)  . sodium chloride 75 mL/hr at 02/17/20 0615  . sodium chloride 10 mL/hr at 02/15/20 1250  LOS: 1 day    Time spent: 20 minutes    Ezekiel Slocumb, DO [facility]  02/17/2020, 7:44 AM    If 7PM-7AM, please contact night-coverage. How to contact the [facility] Attending or Consulting provider [place] or covering provider during after hours [place], for this patient?    1. Check the care team in [facility] and look for a) attending/consulting [facility] provider listed and b) the [facility] team listed 2. Log into [email or web address] and use [place] universal password to access. If you do not have the password, please contact the hospital operator. 3. Locate the [facility] provider you are looking for under [facility] and page to a number that you can be directly reached. 4. If you still have difficulty reaching the provider, please page the [facility] (Director on Call) for the Hospitalists listed on amion for assistance.

## 2020-02-17 NOTE — Progress Notes (Signed)
Patient's platelet count continues to slowly trend up and is currently 12.  She continues to have no evidence of bleeding.  If platelet count continues an upward trend on Sunday Feb 18, 2020, okay to discharge and will arrange close follow-up in the [place].  Will continue to follow.

## 2020-02-18 ENCOUNTER — Inpatient Hospital Stay: Payer: Medicare HMO

## 2020-02-18 DIAGNOSIS — G894 Chronic pain syndrome: Secondary | ICD-10-CM

## 2020-02-18 LAB — CBC WITH DIFFERENTIAL/PLATELET
Abs Immature Granulocytes: 0.05 10*3/uL (ref 0.00–0.07)
Basophils Absolute: 0.1 10*3/uL (ref 0.0–0.1)
Basophils Relative: 0 %
Eosinophils Absolute: 0.3 10*3/uL (ref 0.0–0.5)
Eosinophils Relative: 2 %
HCT: 35.5 % — ABNORMAL LOW (ref 36.0–46.0)
Hemoglobin: 12.4 g/dL (ref 12.0–15.0)
Immature Granulocytes: 0 %
Lymphocytes Relative: 24 %
Lymphs Abs: 3.5 10*3/uL (ref 0.7–4.0)
MCH: 30.7 pg (ref 26.0–34.0)
MCHC: 34.9 g/dL (ref 30.0–36.0)
MCV: 87.9 fL (ref 80.0–100.0)
Monocytes Absolute: 1.3 10*3/uL — ABNORMAL HIGH (ref 0.1–1.0)
Monocytes Relative: 9 %
Neutro Abs: 9.3 10*3/uL — ABNORMAL HIGH (ref 1.7–7.7)
Neutrophils Relative %: 65 %
Platelets: 16 10*3/uL — CL (ref 150–400)
RBC: 4.04 MIL/uL (ref 3.87–5.11)
RDW: 17.1 % — ABNORMAL HIGH (ref 11.5–15.5)
WBC: 14.5 10*3/uL — ABNORMAL HIGH (ref 4.0–10.5)
nRBC: 0 % (ref 0.0–0.2)

## 2020-02-18 LAB — URINALYSIS, COMPLETE (UACMP) WITH MICROSCOPIC
Bacteria, UA: NONE SEEN
Bilirubin Urine: NEGATIVE
Glucose, UA: NEGATIVE mg/dL
Hgb urine dipstick: NEGATIVE
Ketones, ur: NEGATIVE mg/dL
Leukocytes,Ua: NEGATIVE
Nitrite: NEGATIVE
Protein, ur: NEGATIVE mg/dL
Specific Gravity, Urine: 1.004 — ABNORMAL LOW (ref 1.005–1.030)
pH: 7 (ref 5.0–8.0)

## 2020-02-18 MED ORDER — ENSURE ENLIVE PO LIQD: 237.0000 mL | Freq: Two times a day (BID) | ORAL | 12 refills | Status: DC

## 2020-02-18 NOTE — Discharge Summary (Signed)
Physician Discharge Summary  Larry Knipp LPF:[id number] DOB: 06/04/1933 DOA: 02/15/2020  PCP: Idelle Crouch, MD  Admit date: 02/15/2020 Discharge date: 02/18/2020  Admitted From: home Disposition:  home  Recommendations for Outpatient Follow-up:  1. Follow up with PCP in 1-2 weeks 2. Please obtain BMP/CBC in one week 3. Please follow up with Dr. Grayland Ormond in the [facility] early this week.  Home Health: No  Equipment/Devices: None   Discharge Condition: Stable  CODE STATUS: Full  Diet recommendation: Heart Healthy    Discharge Diagnoses: Principal Problem:   Thrombocytopenia (HCC) Active Problems:   Failure to thrive in adult   Chronic ITP (idiopathic thrombocytopenic purpura) (HCC)   Acquired hypothyroidism   DDD (degenerative disc disease), lumbar   Benign essential hypertension   HLD (hyperlipidemia)   Late onset Alzheimer's disease without behavioral disturbance (HCC)   OSA (obstructive sleep apnea)   Chronic pain disorder    Summary of HPI and Hospital Course:  Ariana [name]  is a 86 y.o. Caucasian female with a history of chronic ITP on romiplostim (Nplate), Alzheimer's dementia, asthma, depression and dyslipidemia, who presented to the ED on 5/6 with failure to thrive, not eating or drinking since loss of her son two weeks ago.  There were no other specific complaints.  In the ED, BP was 126/52 with otherwise normal vital signs.  Labs were remarkable for platelets of 9k down from 24 on 01/12/2020.  On chart review, highest platelet count was was 40k on 09/22/2019.  COVID-19 PCR and influenza antigens came back negative.  Dr. Grayland Ormond was contacted in the ED, recommended giving Nplate which was not able to be obtained in the middle of the night at time of admission.  Patient was therefore transfused 1 unit platelets, with improved platelet count to 10k.  Nplate given later in the day on 5/6.  No active bleeding.    Severe thrombocytopenia in the setting  of chronic ITP - s/p NPlate injection and 1 unit platelets transfused on 5/6.  Platelets trended up slightly and remained stable.  Patient has very close follow up in [place] with Dr. Grayland Ormond.  Failure to Thrive / Grief Reaction - patient not eating/drinking since son died two weeks ago.  Treated with maintenance IV fluids, Ensures BID between meals.  PO intake little better.  Depression and anxiety - currently exacerbated by loss of her son. Continue Wellbutrin XL and BuSpar.  Dementia with behavioral disturbance - Continue Aricept and Namenda  Hypothyroidism - continue Synthroid.    Asthma - chronic, stable, not acutely exacerbated.   --continue Singulair and Advair Diskus  Patient BMI: Body mass index is 20.18 kg/m.   DVT prophylaxis: SCD's   Discharge Instructions   Discharge Instructions    Call MD for:   Complete by: As directed    Any bleeding   Call MD for:  extreme fatigue   Complete by: As directed    Call MD for:  severe uncontrolled pain   Complete by: As directed    Call MD for:  temperature >100.4   Complete by: As directed    Diet - low sodium heart healthy   Complete by: As directed    Discharge instructions   Complete by: As directed    Please follow up early this week with Dr. Grayland Ormond in the [facility].  Please monitor for signs of any infection (fever or chills, more sleepy or more confused than normal).   Increase activity slowly   Complete by:  As directed      Allergies as of 02/18/2020      Reactions   Aspirin    Upset stomach   Diazepam Itching   sneezing   Other    seasonal allergies   Tetanus Toxoids    Localized superficial swelling of skin   Morphine Itching, Rash      Medication List    TAKE these medications   acetaminophen 325 MG tablet Commonly known as: TYLENOL Take 2 tablets (650 mg total) by mouth every 6 (six) hours as needed for mild pain (or Fever >/= 101).   albuterol 108 (90 Base) MCG/ACT  inhaler Commonly known as: VENTOLIN HFA Inhale 2 puffs into the lungs every 6 (six) hours as needed for wheezing or shortness of breath.   ALPRAZolam 0.5 MG tablet Commonly known as: XANAX Take 1 tablet (0.5 mg total) by mouth 3 (three) times daily as needed for anxiety.   buPROPion 150 MG 24 hr tablet Commonly known as: WELLBUTRIN XL Take 150 mg by mouth daily.   busPIRone 15 MG tablet Commonly known as: BUSPAR Take 15 mg by mouth 2 (two) times daily.   Calcium 600+D Plus Minerals 600-400 MG-UNIT Tabs Take 1 tablet by mouth daily.   cholecalciferol 25 MCG (1000 UNIT) tablet Commonly known as: VITAMIN D3 Take 1,000 Units by mouth 2 (two) times a day.   citalopram 40 MG tablet Commonly known as: CELEXA Take 40 mg by mouth daily.   donepezil 10 MG tablet Commonly known as: ARICEPT Take 10 mg by mouth at bedtime.   E-400 180 MG (400 UNITS) capsule Generic drug: vitamin E Take 400 Units by mouth daily.   feeding supplement (ENSURE ENLIVE) Liqd Take 237 mLs by mouth 2 (two) times daily between meals.   fluticasone 50 MCG/ACT nasal spray Commonly known as: FLONASE Place 2 sprays into both nostrils daily as needed for allergies.   Fluticasone-Salmeterol 250-50 MCG/DOSE Aepb Commonly known as: ADVAIR Inhale 1 puff into the lungs 2 (two) times daily as needed (shortness of breath).   gabapentin 300 MG capsule Commonly known as: NEURONTIN Take 300 mg by mouth 4 (four) times daily.   levothyroxine 50 MCG tablet Commonly known as: SYNTHROID Take 50 mcg by mouth daily before breakfast. Take 30 to 60 minutes before breakfast.   LUBRICATING EYE DROPS OP Place 1 drop into both eyes daily as needed (dry eyes).   memantine 10 MG tablet Commonly known as: NAMENDA Take 10 mg by mouth daily.   montelukast 10 MG tablet Commonly known as: SINGULAIR Take 10 mg by mouth at bedtime.   Multi-Vitamin tablet Take 1 tablet by mouth daily.   Narcan 4 MG/0.1ML Liqd nasal spray  kit Generic drug: naloxone Place 1 spray into the nose once.   oxybutynin 5 MG tablet Commonly known as: DITROPAN Take 5 mg by mouth 3 (three) times daily.   oxyCODONE-acetaminophen 10-325 MG tablet Commonly known as: PERCOCET Take 1 tablet by mouth every 6 (six) hours as needed for moderate pain or severe pain.   pantoprazole 20 MG tablet Commonly known as: PROTONIX Take 20 mg by mouth 2 (two) times daily.   pravastatin 20 MG tablet Commonly known as: PRAVACHOL Take 20 mg by mouth daily.   PreserVision AREDS 2 Caps Take 1 capsule by mouth 2 (two) times a day.   RA Vitamin B-12 TR 1000 MCG Tbcr Generic drug: Cyanocobalamin Take 1,000 mcg by mouth daily.   vitamin C 500 MG tablet Commonly known as:  ASCORBIC ACID Take 500 mg by mouth daily.       Allergies  Allergen Reactions  . Aspirin     Upset stomach  . Diazepam Itching    sneezing  . Other     seasonal allergies  . Tetanus Toxoids     Localized superficial swelling of skin  . Morphine Itching and Rash    Consultations:  Oncology    Procedures/Studies: DG Chest Port 1 View  Result Date: 02/18/2020 CLINICAL DATA:  Asthma with loss of appetite. EXAM: PORTABLE CHEST 1 VIEW COMPARISON:  August 12, 2019 FINDINGS: The lungs are clear. Heart is upper normal in size with pulmonary vascularity normal. No adenopathy. No bone lesions. IMPRESSION: No edema or consolidation.  Heart upper normal in size Electronically Signed   By: Lowella Grip III M.D.   On: 02/18/2020 11:23        Subjective: Patient seen with husband at bedside.  She was agitated overnight, but calmed down this AM.  Husband says some episodes at home, but always manageable and he denied any concerns about safety or his ability to care for her at home.  Thinks the hospital environment makes it worse.   Discharge Exam: Vitals:   02/18/20 0508 02/18/20 0806  BP: (!) 127/49 (!) 150/61  Pulse: 85 70  Resp: 16 15  Temp: 98.6 F (37 C) (!)  97.5 F (36.4 C)  SpO2: 100% 100%   Vitals:   02/17/20 2022 02/17/20 2124 02/18/20 0508 02/18/20 0806  BP:   (!) 127/49 (!) 150/61  Pulse: (!) 120 (!) 110 85 70  Resp:   16 15  Temp:   98.6 F (37 C) (!) 97.5 F (36.4 C)  TempSrc:   Oral Oral  SpO2:   100% 100%  Weight:        General: Pt is alert, awake, not in acute distress, frail Cardiovascular: RRR, S1/S2 +, no rubs, no gallops Respiratory: CTA bilaterally, no wheezing, no rhonchi Abdominal: Soft, NT, ND, bowel sounds + Extremities: no edema, no cyanosis, diffuse patchy ecchymosis without bleeding    The results of significant diagnostics from this hospitalization (including imaging, microbiology, ancillary and laboratory) are listed below for reference.     Microbiology: Recent Results (from the past 240 hour(s))  Respiratory Panel by RT PCR (Flu A&B, Covid) - Nasopharyngeal Swab     Status: None   Collection Time: 02/15/20  3:22 AM   Specimen: Nasopharyngeal Swab  Result Value Ref Range Status   SARS Coronavirus 2 by RT PCR NEGATIVE NEGATIVE Final    Comment: (NOTE) SARS-CoV-2 target nucleic acids are NOT DETECTED. The SARS-CoV-2 RNA is generally detectable in upper respiratoy specimens during the acute phase of infection. The lowest concentration of SARS-CoV-2 viral copies this assay can detect is 131 copies/mL. A negative result does not preclude SARS-Cov-2 infection and should not be used as the sole basis for treatment or other patient management decisions. A negative result may occur with  improper specimen collection/handling, submission of specimen other than nasopharyngeal swab, presence of viral mutation(s) within the areas targeted by this assay, and inadequate number of viral copies (<131 copies/mL). A negative result must be combined with clinical observations, patient history, and epidemiological information. The expected result is Negative. Fact Sheet for Patients:   PinkCheek.be Fact Sheet for Healthcare Providers:  GravelBags.it This test is not yet ap proved or cleared by the [place] FDA and  has been authorized for detection and/or diagnosis of SARS-CoV-2 by FDA  under an Emergency Use Authorization (EUA). This EUA will remain  in effect (meaning this test can be used) for the duration of the COVID-19 declaration under Section 564(b)(1) of the Act, 21 U.S.C. section 360bbb-3(b)(1), unless the authorization is terminated or revoked sooner.    Influenza A by PCR NEGATIVE NEGATIVE Final   Influenza B by PCR NEGATIVE NEGATIVE Final    Comment: (NOTE) The Xpert Xpress SARS-CoV-2/FLU/RSV assay is intended as an aid in  the diagnosis of influenza from Nasopharyngeal swab specimens and  should not be used as a sole basis for treatment. Nasal washings and  aspirates are unacceptable for Xpert Xpress SARS-CoV-2/FLU/RSV  testing. Fact Sheet for Patients: PinkCheek.be Fact Sheet for Healthcare Providers: GravelBags.it This test is not yet approved or cleared by the [place] FDA and  has been authorized for detection and/or diagnosis of SARS-CoV-2 by  FDA under an Emergency Use Authorization (EUA). This EUA will remain  in effect (meaning this test can be used) for the duration of the  Covid-19 declaration under Section 564(b)(1) of the Act, 21  U.S.C. section 360bbb-3(b)(1), unless the authorization is  terminated or revoked. Performed at [facility], [address] [place], [place] [address]      Labs: BNP (last 3 results) Recent Labs    07/11/19 0533  BNP 007.6*   Basic Metabolic Panel: Recent Labs  Lab 02/14/20 2253 02/15/20 1245  NA 136 142  K 3.7 4.2  CL 103 113*  CO2 27 24  GLUCOSE 85 90  BUN 14 9  CREATININE 0.85 0.77  CALCIUM 9.0 8.7*  MG  --  2.1   Liver Function Tests: Recent Labs   Lab 02/14/20 2253  AST 24  ALT 26  ALKPHOS 61  BILITOT 0.5  PROT 6.3*  ALBUMIN 3.9   No results for input(s): LIPASE, AMYLASE in the last 168 hours. No results for input(s): AMMONIA in the last 168 hours. CBC: Recent Labs  Lab 02/14/20 2253 02/15/20 1245 02/16/20 0422 02/17/20 0422 02/18/20 0337  WBC 11.3* 10.0 9.2 9.1 14.5*  NEUTROABS  --   --  4.6 4.7 9.3*  HGB 14.3 12.5 12.8 13.4 12.4  HCT 42.3 37.6 37.6 39.8 35.5*  MCV 89.6 91.5 89.3 90.2 87.9  PLT 9* 10* 11* 12* 16*   Cardiac Enzymes: No results for input(s): CKTOTAL, CKMB, CKMBINDEX, TROPONINI in the last 168 hours. BNP: Invalid input(s): POCBNP CBG: Recent Labs  Lab 02/14/20 2249 02/15/20 0054  GLUCAP 65* 95   D-Dimer No results for input(s): DDIMER in the last 72 hours. Hgb A1c No results for input(s): HGBA1C in the last 72 hours. Lipid Profile No results for input(s): CHOL, HDL, LDLCALC, TRIG, CHOLHDL, LDLDIRECT in the last 72 hours. Thyroid function studies Recent Labs    02/17/20 0422  TSH 1.856   Anemia work up No results for input(s): VITAMINB12, FOLATE, FERRITIN, TIBC, IRON, RETICCTPCT in the last 72 hours. Urinalysis    Component Value Date/Time   COLORURINE STRAW (A) 02/18/2020 0938   APPEARANCEUR CLEAR (A) 02/18/2020 0938   APPEARANCEUR Clear 09/03/2014 1200   LABSPEC 1.004 (L) 02/18/2020 0938   LABSPEC 1.018 09/03/2014 1200   PHURINE 7.0 02/18/2020 [place] 02/18/2020 0938   GLUCOSEU Negative 09/03/2014 1200   HGBUR NEGATIVE 02/18/2020 [place] 02/18/2020 0938   BILIRUBINUR Negative 09/03/2014 [place] 02/18/2020 [place] 02/18/2020 0938   NITRITE NEGATIVE 02/18/2020 0938   LEUKOCYTESUR  NEGATIVE 02/18/2020 0938   LEUKOCYTESUR Negative 09/03/2014 1200   Sepsis Labs Invalid input(s): PROCALCITONIN,  WBC,  LACTICIDVEN Microbiology Recent Results (from the past 240 hour(s))  Respiratory Panel by RT PCR (Flu  A&B, Covid) - Nasopharyngeal Swab     Status: None   Collection Time: 02/15/20  3:22 AM   Specimen: Nasopharyngeal Swab  Result Value Ref Range Status   SARS Coronavirus 2 by RT PCR NEGATIVE NEGATIVE Final    Comment: (NOTE) SARS-CoV-2 target nucleic acids are NOT DETECTED. The SARS-CoV-2 RNA is generally detectable in upper respiratoy specimens during the acute phase of infection. The lowest concentration of SARS-CoV-2 viral copies this assay can detect is 131 copies/mL. A negative result does not preclude SARS-Cov-2 infection and should not be used as the sole basis for treatment or other patient management decisions. A negative result may occur with  improper specimen collection/handling, submission of specimen other than nasopharyngeal swab, presence of viral mutation(s) within the areas targeted by this assay, and inadequate number of viral copies (<131 copies/mL). A negative result must be combined with clinical observations, patient history, and epidemiological information. The expected result is Negative. Fact Sheet for Patients:  PinkCheek.be Fact Sheet for Healthcare Providers:  GravelBags.it This test is not yet ap proved or cleared by the [place] FDA and  has been authorized for detection and/or diagnosis of SARS-CoV-2 by FDA under an Emergency Use Authorization (EUA). This EUA will remain  in effect (meaning this test can be used) for the duration of the COVID-19 declaration under Section 564(b)(1) of the Act, 21 U.S.C. section 360bbb-3(b)(1), unless the authorization is terminated or revoked sooner.    Influenza A by PCR NEGATIVE NEGATIVE Final   Influenza B by PCR NEGATIVE NEGATIVE Final    Comment: (NOTE) The Xpert Xpress SARS-CoV-2/FLU/RSV assay is intended as an aid in  the diagnosis of influenza from Nasopharyngeal swab specimens and  should not be used as a sole basis for treatment. Nasal washings  and  aspirates are unacceptable for Xpert Xpress SARS-CoV-2/FLU/RSV  testing. Fact Sheet for Patients: PinkCheek.be Fact Sheet for Healthcare Providers: GravelBags.it This test is not yet approved or cleared by the [place] FDA and  has been authorized for detection and/or diagnosis of SARS-CoV-2 by  FDA under an Emergency Use Authorization (EUA). This EUA will remain  in effect (meaning this test can be used) for the duration of the  Covid-19 declaration under Section 564(b)(1) of the Act, 21  U.S.C. section 360bbb-3(b)(1), unless the authorization is  terminated or revoked. Performed at [facility], [address] [place], [place] [address]      Time coordinating discharge: Over 30 minutes  SIGNED:   Ezekiel Slocumb, DO [facility] 02/18/2020, 1:14 PM   If 7PM-7AM, please contact night-coverage [email or web address]

## 2020-02-18 NOTE — Progress Notes (Signed)
PT Cancellation Note  Patient Details Name: Ariana [name] MRN: [id number] DOB: Mar 02, 1933   Cancelled Treatment:    Reason Eval/Treat Not Completed: Other (comment)(MD discontinued PT order as pt is functioning at her baseline (per RN).)   [name] 02/18/2020, 1:36 PM

## 2020-02-18 NOTE — Progress Notes (Signed)
Discharge Note: Reviewed med list with spouse due to pt confused/dimentia.  Spouse verbalized understanding. New dressing to skin tear on  Right posterior leg. Wound bed beefy red. Small bloody drainage on foam dressing. No abnormal odor noted.  Vitals obtained. Staff wheeled Pt out. Spouse transported pt via private vehicle.

## 2020-02-20 ENCOUNTER — Other Ambulatory Visit: Payer: Self-pay

## 2020-02-20 ENCOUNTER — Encounter: Payer: Self-pay | Admitting: Oncology

## 2020-02-20 ENCOUNTER — Inpatient Hospital Stay: Payer: Medicare HMO | Attending: Oncology

## 2020-02-20 ENCOUNTER — Inpatient Hospital Stay: Payer: Medicare HMO | Admitting: Oncology

## 2020-02-20 VITALS — BP 111/96 | HR 79 | Resp 20 | Wt 128.5 lb

## 2020-02-20 DIAGNOSIS — F0390 Unspecified dementia without behavioral disturbance: Secondary | ICD-10-CM | POA: Diagnosis not present

## 2020-02-20 DIAGNOSIS — D693 Immune thrombocytopenic purpura: Secondary | ICD-10-CM | POA: Diagnosis not present

## 2020-02-20 DIAGNOSIS — F329 Major depressive disorder, single episode, unspecified: Secondary | ICD-10-CM | POA: Insufficient documentation

## 2020-02-20 DIAGNOSIS — F4321 Adjustment disorder with depressed mood: Secondary | ICD-10-CM | POA: Diagnosis not present

## 2020-02-20 LAB — CBC WITH DIFFERENTIAL/PLATELET
Abs Immature Granulocytes: 0.04 10*3/uL (ref 0.00–0.07)
Basophils Absolute: 0.1 10*3/uL (ref 0.0–0.1)
Basophils Relative: 1 %
Eosinophils Absolute: 0.3 10*3/uL (ref 0.0–0.5)
Eosinophils Relative: 2 %
HCT: 39.8 % (ref 36.0–46.0)
Hemoglobin: 13.4 g/dL (ref 12.0–15.0)
Immature Granulocytes: 0 %
Lymphocytes Relative: 20 %
Lymphs Abs: 2.4 10*3/uL (ref 0.7–4.0)
MCH: 30.2 pg (ref 26.0–34.0)
MCHC: 33.7 g/dL (ref 30.0–36.0)
MCV: 89.8 fL (ref 80.0–100.0)
Monocytes Absolute: 0.9 10*3/uL (ref 0.1–1.0)
Monocytes Relative: 8 %
Neutro Abs: 8.5 10*3/uL — ABNORMAL HIGH (ref 1.7–7.7)
Neutrophils Relative %: 69 %
Platelets: 34 10*3/uL — ABNORMAL LOW (ref 150–400)
RBC: 4.43 MIL/uL (ref 3.87–5.11)
RDW: 17.3 % — ABNORMAL HIGH (ref 11.5–15.5)
WBC: 12.2 10*3/uL — ABNORMAL HIGH (ref 4.0–10.5)
nRBC: 0 % (ref 0.0–0.2)

## 2020-02-20 NOTE — Progress Notes (Signed)
Patient's husband reports she doesn't have an appetite and has not been staying hydrated. Denies any pain or other concerns at this time.

## 2020-02-20 NOTE — Progress Notes (Signed)
[facility]  [phone]  [phone]     Ariana [name] DOB: [birth year]  MR#: [id number]  IRW#:[id number]  Patient Care Team: Idelle Crouch, MD as PCP - General (Internal Medicine) Lloyd Huger, MD as Consulting Physician (Oncology)  CHIEF COMPLAINT: Chronic refractory ITP  INTERVAL HISTORY: Patient returns to clinic today for laboratory work and hospital follow-up.  He was recently admitted with declining performance status and failure to thrive secondary to the death of her son several weeks ago.  She was also noted to have a platelet count of 9 and received Nplate as an inpatient.  She continues to have significant grief and depression causing a poor appetite, but admits this has slowly improved.  She otherwise feels well. She denies any falls in the interim. She has no neurologic complaints.  She denies any fevers.  She denies any chest pain, shortness of breath, cough, or hemoptysis.  She denies any nausea, vomiting, constipation, or diarrhea.  She has no melena or hematochezia.  She has no urinary complaints.  Patient offers no further specific complaints today.  REVIEW OF SYSTEMS:   Review of Systems  Constitutional: Positive for malaise/fatigue and weight loss. Negative for fever.  HENT: Negative for congestion.   Respiratory: Negative.  Negative for cough, hemoptysis and shortness of breath.   Cardiovascular: Negative.  Negative for chest pain and leg swelling.  Gastrointestinal: Negative.  Negative for abdominal pain, blood in stool, diarrhea, melena and nausea.  Genitourinary: Negative.  Negative for dysuria and hematuria.  Musculoskeletal: Negative.  Negative for back pain and falls.  Skin: Negative.  Negative for rash.  Neurological: Positive for weakness. Negative for dizziness, sensory change, focal weakness and headaches.  Endo/Heme/Allergies: Does not bruise/bleed easily.  Psychiatric/Behavioral: Positive for  depression and memory loss. The patient is not nervous/anxious.     As per HPI. Otherwise, a complete review of systems is negative.  PAST MEDICAL HISTORY: Past Medical History:  Diagnosis Date  . Alzheimer disease ([place])   . Anemia   . Anxiety   . Asthma   . Chronic back pain   . Depression   . Depression   . GERD (gastroesophageal reflux disease)   . Hiatal hernia   . Hypercholesteremia   . ITP (idiopathic thrombocytopenic purpura)   . Osteoarthritis   . Osteoporosis   . Pulmonary emboli ([place])   . Restless legs     PAST SURGICAL HISTORY: Past Surgical History:  Procedure Laterality Date  . CARPAL TUNNEL RELEASE    . ESOPHAGOGASTRODUODENOSCOPY (EGD) WITH PROPOFOL N/A 09/10/2015   Procedure: ESOPHAGOGASTRODUODENOSCOPY (EGD) WITH PROPOFOL;  Surgeon: Lollie Sails, MD;  Location: [facility] ENDOSCOPY;  Service: Endoscopy;  Laterality: N/A;  . IRRIGATION AND DEBRIDEMENT HEMATOMA Right 07/13/2018   Procedure: IRRIGATION AND DEBRIDEMENT HEMATOMA-RIGHT SHIN;  Surgeon: Herbert Pun, MD;  Location: [facility] ORS;  Service: General;  Laterality: Right;  . KYPHOPLASTY N/A 05/02/2019   Procedure: L1 KYPHOPLASTY;  Surgeon: Hessie Knows, MD;  Location: [facility] ORS;  Service: Orthopedics;  Laterality: N/A;  . LUMBAR [place]    . SPLENECTOMY, PARTIAL    . TOTAL ABDOMINAL HYSTERECTOMY      FAMILY HISTORY Family History  Problem Relation Age of Onset  . Stroke Mother     GYNECOLOGIC HISTORY:  No LMP recorded. Patient is postmenopausal.     ADVANCED DIRECTIVES:    HEALTH MAINTENANCE: Social History   Tobacco Use  . Smoking status: Never Smoker  . Smokeless tobacco: Never Used  Substance Use Topics  . Alcohol use: No    Alcohol/week: 0.0 standard drinks  . Drug use: No     Allergies  Allergen Reactions  . Aspirin     Upset stomach  . Diazepam Itching    sneezing  . Other     seasonal allergies  . Tetanus Toxoids     Localized superficial swelling of skin  .  Morphine Itching and Rash    Current Outpatient Medications  Medication Sig Dispense Refill  . acetaminophen (TYLENOL) 325 MG tablet Take 2 tablets (650 mg total) by mouth every 6 (six) hours as needed for mild pain (or Fever >/= 101). 100 tablet 0  . albuterol (PROVENTIL HFA;VENTOLIN HFA) 108 (90 BASE) MCG/ACT inhaler Inhale 2 puffs into the lungs every 6 (six) hours as needed for wheezing or shortness of breath.    . ALPRAZolam (XANAX) 0.5 MG tablet Take 1 tablet (0.5 mg total) by mouth 3 (three) times daily as needed for anxiety. 20 tablet 0  . buPROPion (WELLBUTRIN XL) 150 MG 24 hr tablet Take 150 mg by mouth daily.    . busPIRone (BUSPAR) 15 MG tablet Take 15 mg by mouth 2 (two) times daily.     . Calcium Carbonate-Vit D-Min (CALCIUM 600+D PLUS MINERALS) 600-400 MG-UNIT TABS Take 1 tablet by mouth daily.     . Carboxymethylcellul-Glycerin (LUBRICATING EYE DROPS OP) Place 1 drop into both eyes daily as needed (dry eyes).    . cholecalciferol (VITAMIN D3) 25 MCG (1000 UT) tablet Take 1,000 Units by mouth 2 (two) times a day.    . citalopram (CELEXA) 40 MG tablet Take 40 mg by mouth daily.    . Cyanocobalamin (RA VITAMIN B-12 TR) 1000 MCG TBCR Take 1,000 mcg by mouth daily.     Marland Kitchen donepezil (ARICEPT) 10 MG tablet Take 10 mg by mouth at bedtime.     . feeding supplement, ENSURE ENLIVE, (ENSURE ENLIVE) LIQD Take 237 mLs by mouth 2 (two) times daily between meals. 237 mL 12  . fluticasone (FLONASE) 50 MCG/ACT nasal spray Place 2 sprays into both nostrils daily as needed for allergies.     . Fluticasone-Salmeterol (ADVAIR) 250-50 MCG/DOSE AEPB Inhale 1 puff into the lungs 2 (two) times daily as needed (shortness of breath).     . gabapentin (NEURONTIN) 300 MG capsule Take 300 mg by mouth 4 (four) times daily.    Marland Kitchen levothyroxine (SYNTHROID, LEVOTHROID) 50 MCG tablet Take 50 mcg by mouth daily before breakfast. Take 30 to 60 minutes before breakfast.    . memantine (NAMENDA) 10 MG tablet Take 10 mg  by mouth daily.     . montelukast (SINGULAIR) 10 MG tablet Take 10 mg by mouth at bedtime.     . Multiple Vitamin (MULTI-VITAMIN) tablet Take 1 tablet by mouth daily.     . Multiple Vitamins-Minerals (PRESERVISION AREDS 2) CAPS Take 1 capsule by mouth 2 (two) times a day.    Marland Kitchen NARCAN 4 MG/0.1ML LIQD nasal spray kit Place 1 spray into the nose once.     Marland Kitchen oxybutynin (DITROPAN) 5 MG tablet Take 5 mg by mouth 3 (three) times daily.    Marland Kitchen oxyCODONE-acetaminophen (PERCOCET) 10-325 MG tablet Take 1 tablet by mouth every 6 (six) hours as needed for moderate pain or severe pain.    . pantoprazole (PROTONIX) 20 MG tablet Take 20 mg by mouth 2 (two) times daily.     . pravastatin (PRAVACHOL) 20 MG tablet Take 20 mg by mouth  daily.    . vitamin C (ASCORBIC ACID) 500 MG tablet Take 500 mg by mouth daily.    . vitamin E (E-400) 400 UNIT capsule Take 400 Units by mouth daily.     No current facility-administered medications for this visit.    OBJECTIVE: BP (!) 111/96 (BP Location: Left Arm, Patient Position: Sitting, Cuff Size: Small)   Pulse 79   Resp 20   Wt 128 lb 8 oz (58.3 kg)   SpO2 100%   BMI 20.74 kg/m    Body mass index is 20.74 kg/m.    ECOG FS:1 - Symptomatic but completely ambulatory  General: Well-developed, well-nourished, no acute distress. Eyes: Pink conjunctiva, anicteric sclera. HEENT: Normocephalic, moist mucous membranes. Lungs: No audible wheezing or coughing. Heart: Regular rate and rhythm. Abdomen: Soft, nontender, no obvious distention. Musculoskeletal: No edema, cyanosis, or clubbing. Neuro: Alert, answering all questions appropriately. Cranial nerves grossly intact. Skin: No rashes or petechiae noted. Psych: Normal affect.   LAB RESULTS:  Appointment on 02/20/2020  Component Date Value Ref Range Status  . WBC 02/20/2020 12.2* 4.0 - 10.5 K/uL Final  . RBC 02/20/2020 4.43  3.87 - 5.11 MIL/uL Final  . Hemoglobin 02/20/2020 13.4  12.0 - 15.0 g/dL Final  . HCT  02/20/2020 39.8  36.0 - 46.0 % Final  . MCV 02/20/2020 89.8  80.0 - 100.0 fL Final  . MCH 02/20/2020 30.2  26.0 - 34.0 pg Final  . MCHC 02/20/2020 33.7  30.0 - 36.0 g/dL Final  . RDW 02/20/2020 17.3* 11.5 - 15.5 % Final  . Platelets 02/20/2020 34* 150 - 400 K/uL Final   Comment: Immature Platelet Fraction may be clinically indicated, consider ordering this additional test [id number]   . nRBC 02/20/2020 0.0  0.0 - 0.2 % Final  . Neutrophils Relative % 02/20/2020 69  % Final  . Neutro Abs 02/20/2020 8.5* 1.7 - 7.7 K/uL Final  . Lymphocytes Relative 02/20/2020 20  % Final  . Lymphs Abs 02/20/2020 2.4  0.7 - 4.0 K/uL Final  . Monocytes Relative 02/20/2020 8  % Final  . Monocytes Absolute 02/20/2020 0.9  0.1 - 1.0 K/uL Final  . Eosinophils Relative 02/20/2020 2  % Final  . Eosinophils Absolute 02/20/2020 0.3  0.0 - 0.5 K/uL Final  . Basophils Relative 02/20/2020 1  % Final  . Basophils Absolute 02/20/2020 0.1  0.0 - 0.1 K/uL Final  . Immature Granulocytes 02/20/2020 0  % Final  . Abs Immature Granulocytes 02/20/2020 0.04  0.00 - 0.07 K/uL Final   Performed at [facility], [address] [place], [place] [address]  Admission on 02/15/2020, Discharged on 02/18/2020  Component Date Value Ref Range Status  . Glucose-Capillary 02/14/2020 65* 70 - 99 mg/dL Final   Glucose reference range applies only to samples taken after fasting for at least 8 hours.  . WBC 02/14/2020 11.3* 4.0 - 10.5 K/uL Final  . RBC 02/14/2020 4.72  3.87 - 5.11 MIL/uL Final  . Hemoglobin 02/14/2020 14.3  12.0 - 15.0 g/dL Final  . HCT 02/14/2020 42.3  36.0 - 46.0 % Final  . MCV 02/14/2020 89.6  80.0 - 100.0 fL Final  . MCH 02/14/2020 30.3  26.0 - 34.0 pg Final  . MCHC 02/14/2020 33.8  30.0 - 36.0 g/dL Final  . RDW 02/14/2020 17.1* 11.5 - 15.5 % Final  . Platelets 02/14/2020 9* 150 - 400 K/uL Final   This critical result has verified and been called to [employer] by [employer]  on 05 05 2021 at 2349, and has  been read back.   Marland Kitchen nRBC 02/14/2020 0.0  0.0 - 0.2 % Final   Performed at [facility], [address] [place], [place] [address]  . Sodium 02/14/2020 136  135 - 145 mmol/L Final  . Potassium 02/14/2020 3.7  3.5 - 5.1 mmol/L Final  . Chloride 02/14/2020 103  98 - 111 mmol/L Final  . CO2 02/14/2020 27  22 - 32 mmol/L Final  . Glucose, Bld 02/14/2020 85  70 - 99 mg/dL Final   Glucose reference range applies only to samples taken after fasting for at least 8 hours.  . BUN 02/14/2020 14  8 - 23 mg/dL Final  . Creatinine, Ser 02/14/2020 0.85  0.44 - 1.00 mg/dL Final  . Calcium 02/14/2020 9.0  8.9 - 10.3 mg/dL Final  . Total Protein 02/14/2020 6.3* 6.5 - 8.1 g/dL Final  . Albumin 02/14/2020 3.9  3.5 - 5.0 g/dL Final  . AST 02/14/2020 24  15 - 41 U/L Final  . ALT 02/14/2020 26  0 - 44 U/L Final  . Alkaline Phosphatase 02/14/2020 61  38 - 126 U/L Final  . Total Bilirubin 02/14/2020 0.5  0.3 - 1.2 mg/dL Final  . GFR calc non Af Amer 02/14/2020 >60  >60 mL/min Final  . GFR calc Af Amer 02/14/2020 >60  >60 mL/min Final  . Anion gap 02/14/2020 6  5 - 15 Final   Performed at [facility], [address] [place], [place] [address]  . Glucose-Capillary 02/15/2020 95  70 - 99 mg/dL Final   Glucose reference range applies only to samples taken after fasting for at least 8 hours.  . ABO/RH(D) 02/15/2020 B POS   Final  . Antibody Screen 02/15/2020 NEG   Final  . Sample Expiration 02/15/2020    Final                   Value:02/18/2020,2359 Performed at [facility], [address] [place], [place] [address]   . Unit Number 02/15/2020 [id number]   Final  . Blood Component Type 02/15/2020 PLTP LR2 PAS   Final  . Unit division 02/15/2020 00   Final  . Status of Unit 02/15/2020 ISSUED,FINAL   Final  . Transfusion Status 02/15/2020    Final                   Value:OK TO TRANSFUSE Performed at [facility], [address], [place],  [address]   . SARS  Coronavirus 2 by RT PCR 02/15/2020 NEGATIVE  NEGATIVE Final   Comment: (NOTE) SARS-CoV-2 target nucleic acids are NOT DETECTED. The SARS-CoV-2 RNA is generally detectable in upper respiratoy specimens during the acute phase of infection. The lowest concentration of SARS-CoV-2 viral copies this assay can detect is 131 copies/mL. A negative result does not preclude SARS-Cov-2 infection and should not be used as the sole basis for treatment or other patient management decisions. A negative result may occur with  improper specimen collection/handling, submission of specimen other than nasopharyngeal swab, presence of viral mutation(s) within the areas targeted by this assay, and inadequate number of viral copies (<131 copies/mL). A negative result must be combined with clinical observations, patient history, and epidemiological information. The expected result is Negative. Fact Sheet for Patients:  PinkCheek.be Fact Sheet for Healthcare Providers:  GravelBags.it This test is not yet ap  proved or cleared by the [place] and  has been authorized for detection and/or diagnosis of SARS-CoV-2 by FDA under an Emergency Use Authorization (EUA). This EUA will remain  in effect (meaning this test can be used) for the duration of the COVID-19 declaration under Section 564(b)(1) of the Act, 21 U.S.C. section 360bbb-3(b)(1), unless the authorization is terminated or revoked sooner.   . Influenza A by PCR 02/15/2020 NEGATIVE  NEGATIVE Final  . Influenza B by PCR 02/15/2020 NEGATIVE  NEGATIVE Final   Comment: (NOTE) The Xpert Xpress SARS-CoV-2/FLU/RSV assay is intended as an aid in  the diagnosis of influenza from Nasopharyngeal swab specimens and  should not be used as a sole basis for treatment. Nasal washings and  aspirates are unacceptable for Xpert Xpress SARS-CoV-2/FLU/RSV  testing. Fact Sheet for  Patients: PinkCheek.be Fact Sheet for Healthcare Providers: GravelBags.it This test is not yet approved or cleared by the [place] FDA and  has been authorized for detection and/or diagnosis of SARS-CoV-2 by  FDA under an Emergency Use Authorization (EUA). This EUA will remain  in effect (meaning this test can be used) for the duration of the  Covid-19 declaration under Section 564(b)(1) of the Act, 21  U.S.C. section 360bbb-3(b)(1), unless the authorization is  terminated or revoked. Performed at [facility], [address], [place], [place] [address]   . Sodium 02/15/2020 142  135 - 145 mmol/L Final  . Potassium 02/15/2020 4.2  3.5 - 5.1 mmol/L Final   HEMOLYSIS AT THIS LEVEL MAY AFFECT RESULT  . Chloride 02/15/2020 113* 98 - 111 mmol/L Final  . CO2 02/15/2020 24  22 - 32 mmol/L Final  . Glucose, Bld 02/15/2020 90  70 - 99 mg/dL Final   Glucose reference range applies only to samples taken after fasting for at least 8 hours.  . BUN 02/15/2020 9  8 - 23 mg/dL Final  . Creatinine, Ser 02/15/2020 0.77  0.44 - 1.00 mg/dL Final  . Calcium 02/15/2020 8.7* 8.9 - 10.3 mg/dL Final  . GFR calc non Af Amer 02/15/2020 >60  >60 mL/min Final  . GFR calc Af Amer 02/15/2020 >60  >60 mL/min Final  . Anion gap 02/15/2020 5  5 - 15 Final   Performed at [facility], [address] [place], [place] [address]  . WBC 02/15/2020 10.0  4.0 - 10.5 K/uL Final  . RBC 02/15/2020 4.11  3.87 - 5.11 MIL/uL Final  . Hemoglobin 02/15/2020 12.5  12.0 - 15.0 g/dL Final  . HCT 02/15/2020 37.6  36.0 - 46.0 % Final  . MCV 02/15/2020 91.5  80.0 - 100.0 fL Final  . MCH 02/15/2020 30.4  26.0 - 34.0 pg Final  . MCHC 02/15/2020 33.2  30.0 - 36.0 g/dL Final  . RDW 02/15/2020 16.8* 11.5 - 15.5 % Final  . Platelets 02/15/2020 10* 150 - 400 K/uL Final   CRITICAL VALUE NOTED.  VALUE IS CONSISTENT WITH PREVIOUSLY REPORTED AND CALLED VALUE.  Marland Kitchen  nRBC 02/15/2020 0.0  0.0 - 0.2 % Final   Performed at [facility], [address] [place], [place] [address]  . ISSUE DATE / TIME 02/15/2020 [id number]   Final  . Blood Product Unit Number 02/15/2020 [id number]   Final  . PRODUCT CODE 02/15/2020 [id number]   Final  . Unit Type and Rh 02/15/2020 8400   Final  . Blood Product Expiration Date 02/15/2020 [id number]   Final  . Magnesium 02/15/2020 2.1  1.7 - 2.4 mg/dL  Final   Performed at [facility], [address], [place], [place] [address]  . Color, Urine 02/16/2020 STRAW* YELLOW Final  . APPearance 02/16/2020 CLEAR* CLEAR Final  . Specific Gravity, Urine 02/16/2020 1.005  1.005 - 1.030 Final  . pH 02/16/2020 7.0  5.0 - 8.0 Final  . Glucose, UA 02/16/2020 NEGATIVE  NEGATIVE mg/dL Final  . Hgb urine dipstick 02/16/2020 NEGATIVE  NEGATIVE Final  . Bilirubin Urine 02/16/2020 NEGATIVE  NEGATIVE Final  . Ketones, ur 02/16/2020 NEGATIVE  NEGATIVE mg/dL Final  . Protein, ur 02/16/2020 NEGATIVE  NEGATIVE mg/dL Final  . Nitrite 02/16/2020 NEGATIVE  NEGATIVE Final  . Chalmers Guest 02/16/2020 NEGATIVE  NEGATIVE Final  . RBC / HPF 02/16/2020 0-5  0 - 5 RBC/hpf Final  . WBC, UA 02/16/2020 0-5  0 - 5 WBC/hpf Final  . Bacteria, UA 02/16/2020 NONE SEEN  NONE SEEN Final  . Squamous Epithelial / LPF 02/16/2020 NONE SEEN  0 - 5 Final   Performed at [facility], [address], [place], [place] [address]  . WBC 02/16/2020 9.2  4.0 - 10.5 K/uL Final  . RBC 02/16/2020 4.21  3.87 - 5.11 MIL/uL Final  . Hemoglobin 02/16/2020 12.8  12.0 - 15.0 g/dL Final  . HCT 02/16/2020 37.6  36.0 - 46.0 % Final  . MCV 02/16/2020 89.3  80.0 - 100.0 fL Final  . MCH 02/16/2020 30.4  26.0 - 34.0 pg Final  . MCHC 02/16/2020 34.0  30.0 - 36.0 g/dL Final  . RDW 02/16/2020 17.2* 11.5 - 15.5 % Final  . Platelets 02/16/2020 11* 150 - 400 K/uL Final   Comment: CRITICAL VALUE NOTED.  VALUE IS CONSISTENT WITH PREVIOUSLY REPORTED AND CALLED  VALUE. Immature Platelet Fraction may be clinically indicated, consider ordering this additional test [id number]   . nRBC 02/16/2020 0.0  0.0 - 0.2 % Final  . Neutrophils Relative % 02/16/2020 50  % Final  . Neutro Abs 02/16/2020 4.6  1.7 - 7.7 K/uL Final  . Lymphocytes Relative 02/16/2020 32  % Final  . Lymphs Abs 02/16/2020 2.9  0.7 - 4.0 K/uL Final  . Monocytes Relative 02/16/2020 12  % Final  . Monocytes Absolute 02/16/2020 1.1* 0.1 - 1.0 K/uL Final  . Eosinophils Relative 02/16/2020 5  % Final  . Eosinophils Absolute 02/16/2020 0.4  0.0 - 0.5 K/uL Final  . Basophils Relative 02/16/2020 1  % Final  . Basophils Absolute 02/16/2020 0.1  0.0 - 0.1 K/uL Final  . Immature Granulocytes 02/16/2020 0  % Final  . Abs Immature Granulocytes 02/16/2020 0.04  0.00 - 0.07 K/uL Final   Performed at [facility], [address], [place], [place] [address]  . TSH 02/17/2020 1.856  0.350 - 4.500 uIU/mL Final   Comment: Performed by a 3rd Generation assay with a functional sensitivity of <=0.01 uIU/mL. Performed at [facility], [address], [place], [place] [address]   . WBC 02/17/2020 9.1  4.0 - 10.5 K/uL Final  . RBC 02/17/2020 4.41  3.87 - 5.11 MIL/uL Final  . Hemoglobin 02/17/2020 13.4  12.0 - 15.0 g/dL Final  . HCT 02/17/2020 39.8  36.0 - 46.0 % Final  . MCV 02/17/2020 90.2  80.0 - 100.0 fL Final  . MCH 02/17/2020 30.4  26.0 - 34.0 pg Final  . MCHC 02/17/2020 33.7  30.0 - 36.0 g/dL Final  . RDW 02/17/2020 16.7* 11.5 - 15.5 % Final  . Platelets 02/17/2020 12* 150 - 400 K/uL Final  Comment: CRITICAL VALUE NOTED.  VALUE IS CONSISTENT WITH PREVIOUSLY REPORTED AND CALLED VALUE. REPEATED TO VERIFY   . nRBC 02/17/2020 0.0  0.0 - 0.2 % Final  . Neutrophils Relative % 02/17/2020 52  % Final  . Neutro Abs 02/17/2020 4.7  1.7 - 7.7 K/uL Final  . Lymphocytes Relative 02/17/2020 32  % Final  . Lymphs Abs 02/17/2020 3.0  0.7 - 4.0 K/uL Final  . Monocytes Relative 02/17/2020 11   % Final  . Monocytes Absolute 02/17/2020 1.0  0.1 - 1.0 K/uL Final  . Eosinophils Relative 02/17/2020 4  % Final  . Eosinophils Absolute 02/17/2020 0.4  0.0 - 0.5 K/uL Final  . Basophils Relative 02/17/2020 1  % Final  . Basophils Absolute 02/17/2020 0.1  0.0 - 0.1 K/uL Final  . Immature Granulocytes 02/17/2020 0  % Final  . Abs Immature Granulocytes 02/17/2020 0.03  0.00 - 0.07 K/uL Final   Performed at [facility], [address] [place], [place] [address]  . WBC 02/18/2020 14.5* 4.0 - 10.5 K/uL Final  . RBC 02/18/2020 4.04  3.87 - 5.11 MIL/uL Final  . Hemoglobin 02/18/2020 12.4  12.0 - 15.0 g/dL Final  . HCT 02/18/2020 35.5* 36.0 - 46.0 % Final  . MCV 02/18/2020 87.9  80.0 - 100.0 fL Final  . MCH 02/18/2020 30.7  26.0 - 34.0 pg Final  . MCHC 02/18/2020 34.9  30.0 - 36.0 g/dL Final  . RDW 02/18/2020 17.1* 11.5 - 15.5 % Final  . Platelets 02/18/2020 16* 150 - 400 K/uL Final   Comment: CRITICAL VALUE NOTED.  VALUE IS CONSISTENT WITH PREVIOUSLY REPORTED AND CALLED VALUE. REPEATED TO VERIFY   . nRBC 02/18/2020 0.0  0.0 - 0.2 % Final  . Neutrophils Relative % 02/18/2020 65  % Final  . Neutro Abs 02/18/2020 9.3* 1.7 - 7.7 K/uL Final  . Lymphocytes Relative 02/18/2020 24  % Final  . Lymphs Abs 02/18/2020 3.5  0.7 - 4.0 K/uL Final  . Monocytes Relative 02/18/2020 9  % Final  . Monocytes Absolute 02/18/2020 1.3* 0.1 - 1.0 K/uL Final  . Eosinophils Relative 02/18/2020 2  % Final  . Eosinophils Absolute 02/18/2020 0.3  0.0 - 0.5 K/uL Final  . Basophils Relative 02/18/2020 0  % Final  . Basophils Absolute 02/18/2020 0.1  0.0 - 0.1 K/uL Final  . Immature Granulocytes 02/18/2020 0  % Final  . Abs Immature Granulocytes 02/18/2020 0.05  0.00 - 0.07 K/uL Final   Performed at [facility], [address] [place], [place] [address]  . Color, Urine 02/18/2020 STRAW* YELLOW Final  . APPearance 02/18/2020 CLEAR* CLEAR Final  . Specific Gravity, Urine 02/18/2020 1.004* 1.005 -  1.030 Final  . pH 02/18/2020 7.0  5.0 - 8.0 Final  . Glucose, UA 02/18/2020 NEGATIVE  NEGATIVE mg/dL Final  . Hgb urine dipstick 02/18/2020 NEGATIVE  NEGATIVE Final  . Bilirubin Urine 02/18/2020 NEGATIVE  NEGATIVE Final  . Ketones, ur 02/18/2020 NEGATIVE  NEGATIVE mg/dL Final  . Protein, ur 02/18/2020 NEGATIVE  NEGATIVE mg/dL Final  . Nitrite 02/18/2020 NEGATIVE  NEGATIVE Final  . Chalmers Guest 02/18/2020 NEGATIVE  NEGATIVE Final  . RBC / HPF 02/18/2020 0-5  0 - 5 RBC/hpf Final  . WBC, UA 02/18/2020 0-5  0 - 5 WBC/hpf Final  . Bacteria, UA 02/18/2020 NONE SEEN  NONE SEEN Final  . Squamous Epithelial / LPF 02/18/2020 0-5  0 - 5 Final   Performed at [facility], [address],  [place], [place] [address]    STUDIES: DG Chest Port 1 View  Result Date: 02/18/2020 CLINICAL DATA:  Asthma with loss of appetite. EXAM: PORTABLE CHEST 1 VIEW COMPARISON:  August 12, 2019 FINDINGS: The lungs are clear. Heart is upper normal in size with pulmonary vascularity normal. No adenopathy. No bone lesions. IMPRESSION: No edema or consolidation.  Heart upper normal in size Electronically Signed   By: Lowella Grip III M.D.   On: 02/18/2020 11:23    ASSESSMENT:  Chronic refractory ITP  PLAN:   1. Chronic refractory ITP: Patient had a poor response to Prednisone, WinRho, and Rituxan. She is status post splenectomy in 2007.  She received IVIG and Nplate she received while at [facility] with significant improvement of her platelets.  If Nplate no longer was effective, could retry IVIG at a future date.  Patient is currently receiving the max dose of Nplate at 10 mcg/kg.  Can also consider Promacta 12.'5mg'$  or Tavalisse 100 mg BID if needed.  Patient's platelet count decreased to 9 while in the hospital and she received her dose of Nplate at that point.  Since then, her platelet count has trended up to 34.  No intervention is needed.  Return to clinic on March 15, 2020 for further evaluation and continuation of  treatment.   2. Weakness and fatigue: Chronic and unchanged. 3.  Depression/grief: Secondary to the recent death of her son.  Monitor.  Patient will also have an appointment with palliative care at next clinic visit. 4.  Poor appetite/weight loss: Secondary to grief, monitor and will readdress at next visit if weight loss continues to occur. 5.  Dementia: Chronic and unchanged.  Patient is now at [employer]. 6.  Leukocytosis: White blood cell count remains elevated, but trending down.  No intervention is needed.  Patient expressed understanding and was in agreement with this plan. She also understands that She can call clinic at any time with any questions, concerns, or complaints.    Lloyd Huger, MD   02/20/2020 12:44 PM

## 2020-03-08 NOTE — Progress Notes (Signed)
  [place]  [phone]  [phone]) [phone]     Shandalyn Munsen DOB: Jun 27, 1933  MR#: [id number]  [id number]:[id number]  Patient Care Team: Idelle Crouch, MD as PCP - General (Internal Medicine) Lloyd Huger, MD as Consulting Physician (Oncology)    Lloyd Huger, MD   03/17/2020 10:37 AM    This encounter was created in error - please disregard.

## 2020-03-12 DIAGNOSIS — D473 Essential (hemorrhagic) thrombocythemia: Secondary | ICD-10-CM | POA: Diagnosis not present

## 2020-03-12 DIAGNOSIS — Z0000 Encounter for general adult medical examination without abnormal findings: Secondary | ICD-10-CM | POA: Diagnosis not present

## 2020-03-12 DIAGNOSIS — E7800 Pure hypercholesterolemia, unspecified: Secondary | ICD-10-CM | POA: Diagnosis not present

## 2020-03-12 DIAGNOSIS — G894 Chronic pain syndrome: Secondary | ICD-10-CM | POA: Diagnosis not present

## 2020-03-12 DIAGNOSIS — I10 Essential (primary) hypertension: Secondary | ICD-10-CM | POA: Diagnosis not present

## 2020-03-12 DIAGNOSIS — G301 Alzheimer's disease with late onset: Secondary | ICD-10-CM | POA: Diagnosis not present

## 2020-03-12 DIAGNOSIS — F0280 Dementia in other diseases classified elsewhere without behavioral disturbance: Secondary | ICD-10-CM | POA: Diagnosis not present

## 2020-03-12 DIAGNOSIS — E039 Hypothyroidism, unspecified: Secondary | ICD-10-CM | POA: Diagnosis not present

## 2020-03-15 ENCOUNTER — Inpatient Hospital Stay: Payer: Medicare HMO | Admitting: Oncology

## 2020-03-15 ENCOUNTER — Inpatient Hospital Stay: Payer: Medicare HMO

## 2020-03-15 ENCOUNTER — Inpatient Hospital Stay: Payer: Medicare HMO | Admitting: Hospice and Palliative Medicine

## 2020-03-18 ENCOUNTER — Other Ambulatory Visit: Payer: Self-pay

## 2020-03-18 ENCOUNTER — Encounter: Payer: Self-pay | Admitting: Emergency Medicine

## 2020-03-18 ENCOUNTER — Emergency Department
Admission: EM | Admit: 2020-03-18 | Discharge: 2020-03-18 | Disposition: A | Discharge status: Home / Self Care | Payer: Medicare HMO | Attending: Student in an Organized Health Care Education/Training Program | Admitting: Student in an Organized Health Care Education/Training Program

## 2020-03-18 ENCOUNTER — Emergency Department: Payer: Medicare HMO

## 2020-03-18 ENCOUNTER — Telehealth: Payer: Self-pay | Admitting: Emergency Medicine

## 2020-03-18 DIAGNOSIS — G309 Alzheimer's disease, unspecified: Secondary | ICD-10-CM | POA: Diagnosis not present

## 2020-03-18 DIAGNOSIS — R519 Headache, unspecified: Secondary | ICD-10-CM | POA: Insufficient documentation

## 2020-03-18 DIAGNOSIS — J45909 Unspecified asthma, uncomplicated: Secondary | ICD-10-CM | POA: Insufficient documentation

## 2020-03-18 DIAGNOSIS — D696 Thrombocytopenia, unspecified: Secondary | ICD-10-CM | POA: Insufficient documentation

## 2020-03-18 DIAGNOSIS — Z79899 Other long term (current) drug therapy: Secondary | ICD-10-CM | POA: Insufficient documentation

## 2020-03-18 DIAGNOSIS — R7989 Other specified abnormal findings of blood chemistry: Secondary | ICD-10-CM | POA: Diagnosis present

## 2020-03-18 LAB — CBC WITH DIFFERENTIAL/PLATELET
Abs Immature Granulocytes: 0.03 10*3/uL (ref 0.00–0.07)
Basophils Absolute: 0.1 10*3/uL (ref 0.0–0.1)
Basophils Relative: 1 %
Eosinophils Absolute: 0.2 10*3/uL (ref 0.0–0.5)
Eosinophils Relative: 2 %
HCT: 35.9 % — ABNORMAL LOW (ref 36.0–46.0)
Hemoglobin: 11.9 g/dL — ABNORMAL LOW (ref 12.0–15.0)
Immature Granulocytes: 0 %
Lymphocytes Relative: 26 %
Lymphs Abs: 2.3 10*3/uL (ref 0.7–4.0)
MCH: 30.7 pg (ref 26.0–34.0)
MCHC: 33.1 g/dL (ref 30.0–36.0)
MCV: 92.5 fL (ref 80.0–100.0)
Monocytes Absolute: 0.8 10*3/uL (ref 0.1–1.0)
Monocytes Relative: 9 %
Neutro Abs: 5.5 10*3/uL (ref 1.7–7.7)
Neutrophils Relative %: 62 %
Platelets: 18 10*3/uL — CL (ref 150–400)
RBC: 3.88 MIL/uL (ref 3.87–5.11)
RDW: 17.2 % — ABNORMAL HIGH (ref 11.5–15.5)
WBC: 8.8 10*3/uL (ref 4.0–10.5)
nRBC: 0 % (ref 0.0–0.2)

## 2020-03-18 LAB — COMPREHENSIVE METABOLIC PANEL
ALT: 12 U/L (ref 0–44)
AST: 22 U/L (ref 15–41)
Albumin: 3.8 g/dL (ref 3.5–5.0)
Alkaline Phosphatase: 66 U/L (ref 38–126)
Anion gap: 9 (ref 5–15)
BUN: 13 mg/dL (ref 8–23)
CO2: 26 mmol/L (ref 22–32)
Calcium: 9 mg/dL (ref 8.9–10.3)
Chloride: 107 mmol/L (ref 98–111)
Creatinine, Ser: 0.9 mg/dL (ref 0.44–1.00)
GFR calc Af Amer: >60 mL/min (ref >60)
GFR calc non Af Amer: 59 mL/min — ABNORMAL LOW (ref >60)
Glucose, Bld: 83 mg/dL (ref 70–99)
Potassium: 3.6 mmol/L (ref 3.5–5.1)
Sodium: 142 mmol/L (ref 135–145)
Total Bilirubin: 1.1 mg/dL (ref 0.3–1.2)
Total Protein: 6.2 g/dL — ABNORMAL LOW (ref 6.5–8.1)

## 2020-03-18 LAB — SAMPLE TO BLOOD BANK

## 2020-03-18 MED ORDER — ROMIPLOSTIM INJECTION 500 MCG
10.0000 ug/kg | Freq: Once | SUBCUTANEOUS | Status: AC
  Administered 2020-03-18: 615 ug via SUBCUTANEOUS
  Filled 2020-03-18: qty 1

## 2020-03-18 NOTE — ED Triage Notes (Signed)
Pt presents to ED via POV with c/o abnormal labs, pt's husband states was told by Dr. Grayland Ormond to come to ED due to low platelets, pt has hx of low platelets and sees Dr. Grayland Ormond due to same. Pt with hx of ITP at this time.   Last documented platelet count 34 on 5/11 at this time.   Pt with documented hx of alzheimer's disease.

## 2020-03-18 NOTE — ED Notes (Signed)
Pt assisted to toilet. Pt has large redness/purple discoloration noted to left upper thigh. Pt also has bruise noted under right eye.  Pt states this is from a previous fall. Pt also has issues with bruising easily per husband and currently has platelet count of 18.

## 2020-03-18 NOTE — Telephone Encounter (Signed)
Returned call to patient's husband after speaking with Dr. Grayland Ormond. Per MD, husband should take pt to the ER, and they can evaluate patient there and give her Nplate if needed. Pt's husband verbalized understanding and didn't have any further questions or concerns.

## 2020-03-18 NOTE — ED Provider Notes (Signed)
[facility] Emergency Department Provider Note    First MD Initiated Contact with Patient 03/18/20 1310     (approximate)  I have reviewed the triage vital signs and the nursing notes.   HISTORY  Chief Complaint Abnormal Lab  Level V Caveat:  dementia  HPI Ariana [name] is a 86 y.o. female bolus past medical history with ITP presents to the ER for low platelets.  Reportedly has been having some headaches over the past several days but denies any recent falls.  Denies any chest pain shortness of breath.  No nausea or vomiting.    Past Medical History:  Diagnosis Date  . Alzheimer disease ([place])   . Anemia   . Anxiety   . Asthma   . Chronic back pain   . Depression   . Depression   . GERD (gastroesophageal reflux disease)   . Hiatal hernia   . Hypercholesteremia   . ITP (idiopathic thrombocytopenic purpura)   . Osteoarthritis   . Osteoporosis   . Pulmonary emboli ([place])   . Restless legs    Family History  Problem Relation Age of Onset  . Stroke Mother    Past Surgical History:  Procedure Laterality Date  . CARPAL TUNNEL RELEASE    . ESOPHAGOGASTRODUODENOSCOPY (EGD) WITH PROPOFOL N/A 09/10/2015   Procedure: ESOPHAGOGASTRODUODENOSCOPY (EGD) WITH PROPOFOL;  Surgeon: Lollie Sails, MD;  Location: [facility] ENDOSCOPY;  Service: Endoscopy;  Laterality: N/A;  . IRRIGATION AND DEBRIDEMENT HEMATOMA Right 07/13/2018   Procedure: IRRIGATION AND DEBRIDEMENT HEMATOMA-RIGHT SHIN;  Surgeon: Herbert Pun, MD;  Location: [facility] ORS;  Service: General;  Laterality: Right;  . KYPHOPLASTY N/A 05/02/2019   Procedure: L1 KYPHOPLASTY;  Surgeon: Hessie Knows, MD;  Location: [facility] ORS;  Service: Orthopedics;  Laterality: N/A;  . LUMBAR [place]    . SPLENECTOMY, PARTIAL    . TOTAL ABDOMINAL HYSTERECTOMY     Patient Active Problem List   Diagnosis Date Noted  . Failure to thrive in adult 02/16/2020  . Thrombocytopenia ([place]) 02/15/2020  .  Pneumonia 07/10/2019  . Laceration of left lower leg 04/10/2019  . Chronic pain disorder 01/11/2019  . OSA (obstructive sleep apnea) 09/28/2018  . Ulcer of lower limb, right, limited to breakdown of skin ([place]) 07/22/2018  . Traumatic hematoma of right lower leg 07/12/2018  . Asthma without status asthmaticus 11/25/2016  . Late onset Alzheimer's disease without behavioral disturbance ([place]) 12/30/2015  . Back muscle spasm 09/09/2015  . Clinical depression 04/12/2015  . Herpes zona 04/12/2015  . Chronic ITP (idiopathic thrombocytopenic purpura) ([facility]) 03/15/2015  . Chest pain 10/22/2014  . Breathlessness on exertion 10/22/2014  . Osteopenia 10/18/2014  . Benign essential hypertension 06/28/2014  . HLD (hyperlipidemia) 06/28/2014  . Idiopathic thrombocythemia ([place]) 06/28/2014  . Acquired hypothyroidism 06/04/2014  . Depressive disorder 06/04/2014  . Bursitis, trochanteric 03/27/2014  . DDD (degenerative disc disease), lumbar 02/27/2014  . Neuritis or radiculitis due to rupture of lumbar intervertebral disc 02/27/2014  . Lumbar radiculitis 02/27/2014      Prior to Admission medications   Medication Sig Start Date End Date Taking? Authorizing Provider  acetaminophen (TYLENOL) 325 MG tablet Take 2 tablets (650 mg total) by mouth every 6 (six) hours as needed for mild pain (or Fever >/= 101). 07/02/15   Idelle Crouch, MD  albuterol (PROVENTIL HFA;VENTOLIN HFA) 108 (90 BASE) MCG/ACT inhaler Inhale 2 puffs into the lungs every 6 (six) hours as needed for wheezing or shortness of breath.    [provider]  ALPRAZolam (XANAX) 0.5 MG tablet Take 1 tablet (0.5 mg total) by mouth 3 (three) times daily as needed for anxiety. 07/15/18   Gladstone Lighter, MD  buPROPion (WELLBUTRIN XL) 150 MG 24 hr tablet Take 150 mg by mouth daily.    [provider]  busPIRone (BUSPAR) 15 MG tablet Take 15 mg by mouth 2 (two) times daily.  08/15/18   [provider]  Calcium  Carbonate-Vit D-Min (CALCIUM 600+D PLUS MINERALS) 600-400 MG-UNIT TABS Take 1 tablet by mouth daily.     [provider]  Carboxymethylcellul-Glycerin (LUBRICATING EYE DROPS OP) Place 1 drop into both eyes daily as needed (dry eyes).    [provider]  cholecalciferol (VITAMIN D3) 25 MCG (1000 UT) tablet Take 1,000 Units by mouth 2 (two) times a day.    [provider]  citalopram (CELEXA) 40 MG tablet Take 40 mg by mouth daily.    [provider]  Cyanocobalamin (RA VITAMIN B-12 TR) 1000 MCG TBCR Take 1,000 mcg by mouth daily.     [provider]  donepezil (ARICEPT) 10 MG tablet Take 10 mg by mouth at bedtime.     [provider]  feeding supplement, ENSURE ENLIVE, (ENSURE ENLIVE) LIQD Take 237 mLs by mouth 2 (two) times daily between meals. 02/18/20   Ezekiel Slocumb, DO  fluticasone (FLONASE) 50 MCG/ACT nasal spray Place 2 sprays into both nostrils daily as needed for allergies.  03/03/17   [provider]  Fluticasone-Salmeterol (ADVAIR) 250-50 MCG/DOSE AEPB Inhale 1 puff into the lungs 2 (two) times daily as needed (shortness of breath).     [provider]  gabapentin (NEURONTIN) 300 MG capsule Take 300 mg by mouth 4 (four) times daily.    [provider]  levothyroxine (SYNTHROID, LEVOTHROID) 50 MCG tablet Take 50 mcg by mouth daily before breakfast. Take 30 to 60 minutes before breakfast.    [provider]  memantine (NAMENDA) 10 MG tablet Take 10 mg by mouth daily.     [provider]  montelukast (SINGULAIR) 10 MG tablet Take 10 mg by mouth at bedtime.     [provider]  Multiple Vitamin (MULTI-VITAMIN) tablet Take 1 tablet by mouth daily.     [provider]  Multiple Vitamins-Minerals (PRESERVISION AREDS 2) CAPS Take 1 capsule by mouth 2 (two) times a day.    [provider]  NARCAN 4 MG/0.1ML LIQD nasal spray kit Place 1 spray into the nose once.  10/24/18    [provider]  oxybutynin (DITROPAN) 5 MG tablet Take 5 mg by mouth 3 (three) times daily.    [provider]  oxyCODONE-acetaminophen (PERCOCET) 10-325 MG tablet Take 1 tablet by mouth every 6 (six) hours as needed for moderate pain or severe pain. 02/09/20   [provider]  pantoprazole (PROTONIX) 20 MG tablet Take 20 mg by mouth 2 (two) times daily.  07/24/15   [provider]  pravastatin (PRAVACHOL) 20 MG tablet Take 20 mg by mouth daily.    [provider]  vitamin C (ASCORBIC ACID) 500 MG tablet Take 500 mg by mouth daily.    [provider]  vitamin E (E-400) 400 UNIT capsule Take 400 Units by mouth daily.    [provider]    Allergies Aspirin, Diazepam, Other, Tetanus toxoids, and Morphine    Social History Social History   Tobacco Use  . Smoking status: Never Smoker  . Smokeless tobacco: Never Used  Substance Use Topics  . Alcohol use: No    Alcohol/week: 0.0 standard drinks  . Drug use: No    Review of Systems Patient denies headaches, rhinorrhea, blurry vision, numbness, shortness of breath, chest pain, edema, cough, abdominal pain, nausea, vomiting, diarrhea, dysuria, fevers, rashes or hallucinations unless otherwise stated above in HPI. ____________________________________________   PHYSICAL EXAM:  VITAL SIGNS: Vitals:   03/18/20 1330 03/18/20 1445  BP: 127/73   Pulse: (!) 59 (!) 56  Resp: 19   Temp:    SpO2: 99% 100%    Constitutional: Alert and oriented.  Eyes: Conjunctivae are normal.  Ecchymosis below right eye, no proptosis Head: Atraumatic. Nose: No congestion/rhinnorhea. Mouth/Throat: Mucous membranes are moist.   Neck: No stridor. Painless ROM.  Cardiovascular: Normal rate, regular rhythm. Grossly normal heart sounds.  Good peripheral circulation. Respiratory: Normal respiratory effort.  No retractions. Lungs CTAB. Gastrointestinal: Soft and nontender. No distention. No  abdominal bruits. No CVA tenderness. Genitourinary:  Musculoskeletal: No lower extremity tenderness nor edema.  No joint effusions. Neurologic:  Normal speech and language. No gross focal neurologic deficits are appreciated. No facial droop Skin:  Skin is warm, dry and intact. No rash noted. Psychiatric: Mood and affect are normal. Speech and behavior are normal.  ____________________________________________   LABS (all labs ordered are listed, but only abnormal results are displayed)  Results for orders placed or performed during the hospital encounter of 03/18/20 (from the past 24 hour(s))  Sample to Blood Bank     Status: None   Collection Time: 03/18/20 11:32 AM  Result Value Ref Range   Blood Bank Specimen SAMPLE AVAILABLE FOR TESTING    Sample Expiration      03/21/2020,2359 Performed at [facility], [address] [place], [place] [address]   CBC with Differential     Status: Abnormal   Collection Time: 03/18/20 11:35 AM  Result Value Ref Range   WBC 8.8 4.0 - 10.5 K/uL   RBC 3.88 3.87 - 5.11 MIL/uL   Hemoglobin 11.9 (L) 12.0 - 15.0 g/dL   HCT 35.9 (L) 36.0 - 46.0 %   MCV 92.5 80.0 - 100.0 fL   MCH 30.7 26.0 - 34.0 pg   MCHC 33.1 30.0 - 36.0 g/dL   RDW 17.2 (H) 11.5 - 15.5 %   Platelets 18 (LL) 150 - 400 K/uL   nRBC 0.0 0.0 - 0.2 %   Neutrophils Relative % 62 %   Neutro Abs 5.5 1.7 - 7.7 K/uL   Lymphocytes Relative 26 %   Lymphs Abs 2.3 0.7 - 4.0 K/uL   Monocytes Relative 9 %   Monocytes Absolute 0.8 0.1 - 1.0 K/uL   Eosinophils Relative 2 %   Eosinophils Absolute 0.2 0.0 - 0.5 K/uL   Basophils Relative 1 %   Basophils Absolute 0.1 0.0 - 0.1 K/uL   WBC Morphology MORPHOLOGY UNREMARKABLE    RBC Morphology MORPHOLOGY UNREMARKABLE    Smear Review PLATELET COUNT CONFIRMED BY SMEAR    Immature Granulocytes 0 %   Abs Immature Granulocytes 0.03 0.00 - 0.07 K/uL  Comprehensive metabolic panel     Status: Abnormal   Collection Time: 03/18/20 11:35 AM   Result Value Ref Range   Sodium 142 135 - 145 mmol/L   Potassium 3.6 3.5 - 5.1 mmol/L   Chloride 107 98 - 111 mmol/L   CO2 26 22 - 32 mmol/L   Glucose, Bld 83 70 - 99 mg/dL   BUN 13 8 - 23 mg/dL  Creatinine, Ser 0.90 0.44 - 1.00 mg/dL   Calcium 9.0 8.9 - 10.3 mg/dL   Total Protein 6.2 (L) 6.5 - 8.1 g/dL   Albumin 3.8 3.5 - 5.0 g/dL   AST 22 15 - 41 U/L   ALT 12 0 - 44 U/L   Alkaline Phosphatase 66 38 - 126 U/L   Total Bilirubin 1.1 0.3 - 1.2 mg/dL   GFR calc non Af Amer 59 (L) >60 mL/min   GFR calc Af Amer >60 >60 mL/min   Anion gap 9 5 - 15   ____________________________________________ ____________________________________________  RADIOLOGY  I personally reviewed all radiographic images ordered to evaluate for the above acute complaints and reviewed radiology reports and findings.  These findings were personally discussed with the patient.  Please see medical record for radiology report.  ____________________________________________   PROCEDURES  Procedure(s) performed:  Procedures    Critical Care performed: no ____________________________________________   INITIAL IMPRESSION / ASSESSMENT AND PLAN / ED COURSE  Pertinent labs & imaging results that were available during my care of the patient were reviewed by me and considered in my medical decision making (see chart for details).   DDX: itp, ttp, ich  Ariana [name] is a 86 y.o. who presents to the ED with chronic ITP presents to the ER for low platelets.  Exam is reassuring.  No evidence of active hemorrhage.  CT imaging is reassuring.  Will discuss consultation with oncology.  Clinical Course as of Mar 18 1508  Mon Mar 18, 2020  1444 Case discussed in consultation with Dr.Finnegan of hematology oncology.  Recommends dose of Nplate infusion here in the ER.  No additional observation or therapeutics indicated at this time.  Her work-up is otherwise reassuring.   [PR]    Clinical Course User  Index [PR] Merlyn Lot, MD   Patient was signed out to oncoming physician pending Nplate infusion.  Patient cleared for discharge after completing that therapy  The patient was evaluated in Emergency Department today for the symptoms described in the history of present illness. He/she was evaluated in the context of the global COVID-19 pandemic, which necessitated consideration that the patient might be at risk for infection with the SARS-CoV-2 virus that causes COVID-19. Institutional protocols and algorithms that pertain to the evaluation of patients at risk for COVID-19 are in a state of rapid change based on information released by regulatory bodies including the CDC and federal and state organizations. These policies and algorithms were followed during the patient's care in the ED.  As part of my medical decision making, I reviewed the following data within the [facility] notes reviewed and incorporated, Labs reviewed, notes from prior ED visits and [place] Controlled Substance Database   ____________________________________________   FINAL CLINICAL IMPRESSION(S) / ED DIAGNOSES  Final diagnoses:  Thrombocytopenia ([place])      NEW MEDICATIONS STARTED DURING THIS VISIT:  New Prescriptions   No medications on file     Note:  This document was prepared using Dragon voice recognition software and may include unintentional dictation errors.    Merlyn Lot, MD 03/18/20 [phone]

## 2020-03-18 NOTE — ED Notes (Signed)
Date and time results received: 03/18/20 12:07 PM  (use smartphrase ".now" to insert current time)  Test: Platelets Critical Value: 18  Name of Provider Notified: Dr. Jimmye Norman  Orders Received? Or Actions Taken?: Critical results acknowledged

## 2020-03-18 NOTE — ED Notes (Signed)
VORB from [facility] for bloodwork.   Full rainbow and Type and Screen sent to lab by this RN.

## 2020-03-18 NOTE — ED Notes (Signed)
Called Pharmacy to check on medication. They are working on it and will try to bring up asap.

## 2020-03-18 NOTE — Discharge Instructions (Signed)
Follow up in Dr. Gary Fleet office.  Return for any questions or concerns.

## 2020-03-26 ENCOUNTER — Other Ambulatory Visit: Payer: Medicare HMO

## 2020-03-26 ENCOUNTER — Encounter: Payer: Medicare HMO | Admitting: Hospice and Palliative Medicine

## 2020-03-26 ENCOUNTER — Ambulatory Visit: Payer: Medicare HMO | Admitting: Oncology

## 2020-03-26 ENCOUNTER — Ambulatory Visit: Payer: Medicare HMO

## 2020-04-12 DIAGNOSIS — R634 Abnormal weight loss: Secondary | ICD-10-CM | POA: Diagnosis not present

## 2020-04-12 DIAGNOSIS — F329 Major depressive disorder, single episode, unspecified: Secondary | ICD-10-CM | POA: Diagnosis not present

## 2020-04-12 DIAGNOSIS — F419 Anxiety disorder, unspecified: Secondary | ICD-10-CM | POA: Diagnosis not present

## 2020-04-12 DIAGNOSIS — F5104 Psychophysiologic insomnia: Secondary | ICD-10-CM | POA: Diagnosis not present

## 2020-04-12 DIAGNOSIS — F4321 Adjustment disorder with depressed mood: Secondary | ICD-10-CM | POA: Diagnosis not present

## 2020-04-12 DIAGNOSIS — R2689 Other abnormalities of gait and mobility: Secondary | ICD-10-CM | POA: Diagnosis not present

## 2020-04-12 DIAGNOSIS — G301 Alzheimer's disease with late onset: Secondary | ICD-10-CM | POA: Diagnosis not present

## 2020-04-12 DIAGNOSIS — F0280 Dementia in other diseases classified elsewhere without behavioral disturbance: Secondary | ICD-10-CM | POA: Diagnosis not present

## 2020-04-14 NOTE — Progress Notes (Signed)
[place]  [phone]  Fax:(336) [phone]     Shanielle Correll DOB: March 28, 1933  MR#: [id number]  MGQ#:[id number]  Patient Care Team: Idelle Crouch, MD as PCP - General (Internal Medicine) Lloyd Huger, MD as Consulting Physician (Oncology)  CHIEF COMPLAINT: Chronic refractory ITP  INTERVAL HISTORY: Patient returns to clinic today for repeat laboratory work, further evaluation, and continuation of endplate.  She continues to have grief and depression secondary to the death of her son several months ago, but her appetite has improved and her weight is relatively stable.  She continues to have memory loss.  She denies any falls in the interim. She has no neurologic complaints.  She denies any fevers.  She denies any chest pain, shortness of breath, cough, or hemoptysis.  She denies any nausea, vomiting, constipation, or diarrhea.  She has no melena or hematochezia.  She has no urinary complaints.  Patient offers no further specific complaints today.  REVIEW OF SYSTEMS:   Review of Systems  Constitutional: Positive for malaise/fatigue. Negative for fever and weight loss.  HENT: Negative for congestion.   Respiratory: Negative.  Negative for cough, hemoptysis and shortness of breath.   Cardiovascular: Negative.  Negative for chest pain and leg swelling.  Gastrointestinal: Negative.  Negative for abdominal pain, blood in stool, diarrhea, melena and nausea.  Genitourinary: Negative.  Negative for dysuria and hematuria.  Musculoskeletal: Negative.  Negative for back pain and falls.  Skin: Negative.  Negative for rash.  Neurological: Positive for weakness. Negative for dizziness, sensory change, focal weakness and headaches.  Endo/Heme/Allergies: Does not bruise/bleed easily.  Psychiatric/Behavioral: Positive for depression and memory loss. The patient is not nervous/anxious.     As per HPI. Otherwise, a complete review of systems is  negative.  PAST MEDICAL HISTORY: Past Medical History:  Diagnosis Date   Alzheimer disease ([place])    Anemia    Anxiety    Asthma    Chronic back pain    Depression    Depression    GERD (gastroesophageal reflux disease)    Hiatal hernia    Hypercholesteremia    ITP (idiopathic thrombocytopenic purpura)    Osteoarthritis    Osteoporosis    Pulmonary emboli ([facility])    Restless legs     PAST SURGICAL HISTORY: Past Surgical History:  Procedure Laterality Date   CARPAL TUNNEL RELEASE     ESOPHAGOGASTRODUODENOSCOPY (EGD) WITH PROPOFOL N/A 09/10/2015   Procedure: ESOPHAGOGASTRODUODENOSCOPY (EGD) WITH PROPOFOL;  Surgeon: Lollie Sails, MD;  Location: [facility] ENDOSCOPY;  Service: Endoscopy;  Laterality: N/A;   IRRIGATION AND DEBRIDEMENT HEMATOMA Right 07/13/2018   Procedure: IRRIGATION AND DEBRIDEMENT HEMATOMA-RIGHT SHIN;  Surgeon: Herbert Pun, MD;  Location: [facility] ORS;  Service: General;  Laterality: Right;   KYPHOPLASTY N/A 05/02/2019   Procedure: L1 KYPHOPLASTY;  Surgeon: Hessie Knows, MD;  Location: [facility] ORS;  Service: Orthopedics;  Laterality: N/A;   LUMBAR DISC SURGERY     SPLENECTOMY, PARTIAL     TOTAL ABDOMINAL HYSTERECTOMY      FAMILY HISTORY Family History  Problem Relation Age of Onset   Stroke Mother     GYNECOLOGIC HISTORY:  No LMP recorded. Patient is postmenopausal.     ADVANCED DIRECTIVES:    HEALTH MAINTENANCE: Social History   Tobacco Use   Smoking status: Never Smoker   Smokeless tobacco: Never Used  Substance Use Topics   Alcohol use: No    Alcohol/week: 0.0 standard drinks   Drug use: No  Allergies  Allergen Reactions   Aspirin     Upset stomach   Diazepam Itching    sneezing   Other     seasonal allergies   Tetanus Toxoids     Localized superficial swelling of skin   Morphine Itching and Rash    Current Outpatient Medications  Medication Sig Dispense Refill   acetaminophen (TYLENOL) 325  MG tablet Take 2 tablets (650 mg total) by mouth every 6 (six) hours as needed for mild pain (or Fever >/= 101). 100 tablet 0   albuterol (PROVENTIL HFA;VENTOLIN HFA) 108 (90 BASE) MCG/ACT inhaler Inhale 2 puffs into the lungs every 6 (six) hours as needed for wheezing or shortness of breath.     ALPRAZolam (XANAX) 0.5 MG tablet Take 1 tablet (0.5 mg total) by mouth 3 (three) times daily as needed for anxiety. 20 tablet 0   buPROPion (WELLBUTRIN XL) 150 MG 24 hr tablet Take 150 mg by mouth daily.     busPIRone (BUSPAR) 15 MG tablet Take 15 mg by mouth 2 (two) times daily.      Calcium Carbonate-Vit D-Min (CALCIUM 600+D PLUS MINERALS) 600-400 MG-UNIT TABS Take 1 tablet by mouth daily.      Carboxymethylcellul-Glycerin (LUBRICATING EYE DROPS OP) Place 1 drop into both eyes daily as needed (dry eyes).     cholecalciferol (VITAMIN D3) 25 MCG (1000 UT) tablet Take 1,000 Units by mouth 2 (two) times a day.     citalopram (CELEXA) 40 MG tablet Take 40 mg by mouth daily.     Cyanocobalamin (RA VITAMIN B-12 TR) 1000 MCG TBCR Take 1,000 mcg by mouth daily.      donepezil (ARICEPT) 10 MG tablet Take 10 mg by mouth at bedtime.      feeding supplement, ENSURE ENLIVE, (ENSURE ENLIVE) LIQD Take 237 mLs by mouth 2 (two) times daily between meals. 237 mL 12   fluticasone (FLONASE) 50 MCG/ACT nasal spray Place 2 sprays into both nostrils daily as needed for allergies.      Fluticasone-Salmeterol (ADVAIR) 250-50 MCG/DOSE AEPB Inhale 1 puff into the lungs 2 (two) times daily as needed (shortness of breath).      gabapentin (NEURONTIN) 300 MG capsule Take 300 mg by mouth 4 (four) times daily.     levothyroxine (SYNTHROID, LEVOTHROID) 50 MCG tablet Take 50 mcg by mouth daily before breakfast. Take 30 to 60 minutes before breakfast.     memantine (NAMENDA) 10 MG tablet Take 10 mg by mouth daily.      montelukast (SINGULAIR) 10 MG tablet Take 10 mg by mouth at bedtime.      Multiple Vitamin  (MULTI-VITAMIN) tablet Take 1 tablet by mouth daily.      Multiple Vitamins-Minerals (PRESERVISION AREDS 2) CAPS Take 1 capsule by mouth 2 (two) times a day.     NARCAN 4 MG/0.1ML LIQD nasal spray kit Place 1 spray into the nose once.      oxybutynin (DITROPAN) 5 MG tablet Take 5 mg by mouth 3 (three) times daily.     oxyCODONE-acetaminophen (PERCOCET) 10-325 MG tablet Take 1 tablet by mouth every 6 (six) hours as needed for moderate pain or severe pain.     pantoprazole (PROTONIX) 20 MG tablet Take 20 mg by mouth 2 (two) times daily.      pravastatin (PRAVACHOL) 20 MG tablet Take 20 mg by mouth daily.     vitamin C (ASCORBIC ACID) 500 MG tablet Take 500 mg by mouth daily.     vitamin E (  E-400) 400 UNIT capsule Take 400 Units by mouth daily.     No current facility-administered medications for this visit.    OBJECTIVE: BP 116/73 (BP Location: Left Arm, Patient Position: Sitting, Cuff Size: Normal)    Pulse 64    Temp 99.1 F (37.3 C) (Tympanic)    Resp 20    Ht '5\' 6"'$  (1.676 m)    Wt 124 lb 11.2 oz (56.6 kg)    SpO2 98%    BMI 20.13 kg/m    Body mass index is 20.13 kg/m.    ECOG FS:1 - Symptomatic but completely ambulatory  General: Thin, no acute distress. Eyes: Pink conjunctiva, anicteric sclera. HEENT: Normocephalic, moist mucous membranes. Lungs: No audible wheezing or coughing. Heart: Regular rate and rhythm. Abdomen: Soft, nontender, no obvious distention. Musculoskeletal: No edema, cyanosis, or clubbing. Neuro: Alert, answering all questions appropriately. Cranial nerves grossly intact. Skin: No rashes or petechiae noted. Psych: Normal affect.    LAB RESULTS:  Appointment on 04/17/2020  Component Date Value Ref Range Status   WBC 04/17/2020 8.9  4.0 - 10.5 K/uL Final   RBC 04/17/2020 4.28  3.87 - 5.11 MIL/uL Final   Hemoglobin 04/17/2020 12.9  12.0 - 15.0 g/dL Final   HCT 04/17/2020 38.5  36 - 46 % Final   MCV 04/17/2020 90.0  80.0 - 100.0 fL Final   MCH  04/17/2020 30.1  26.0 - 34.0 pg Final   MCHC 04/17/2020 33.5  30.0 - 36.0 g/dL Final   RDW 04/17/2020 15.9* 11.5 - 15.5 % Final   Platelets 04/17/2020 17* 150 - 400 K/uL Final   Comment: Immature Platelet Fraction may be clinically indicated, consider ordering this additional test [id number] PLATELET COUNT CONFIRMED BY SMEAR THIS CRITICAL RESULT HAS VERIFIED AND BEEN CALLED TO SARAH OLEJAR BY SHARON CALLAHAN ON 07 07 2021 AT [place], AND HAS BEEN READ BACK. 1009 ON 7721 SLF    nRBC 04/17/2020 0.0  0.0 - 0.2 % Final   Neutrophils Relative % 04/17/2020 61  % Final   Neutro Abs 04/17/2020 5.5  1.7 - 7.7 K/uL Final   Lymphocytes Relative 04/17/2020 28  % Final   Lymphs Abs 04/17/2020 2.5  0.7 - 4.0 K/uL Final   Monocytes Relative 04/17/2020 8  % Final   Monocytes Absolute 04/17/2020 0.7  0 - 1 K/uL Final   Eosinophils Relative 04/17/2020 2  % Final   Eosinophils Absolute 04/17/2020 0.2  0 - 0 K/uL Final   Basophils Relative 04/17/2020 1  % Final   Basophils Absolute 04/17/2020 0.1  0 - 0 K/uL Final   Immature Granulocytes 04/17/2020 0  % Final   Abs Immature Granulocytes 04/17/2020 0.02  0.00 - 0.07 K/uL Final   Performed at [facility], [address] [place], [place] [address]    STUDIES: No results found.  ASSESSMENT:  Chronic refractory ITP  PLAN:   1. Chronic refractory ITP: Patient had a poor response to Prednisone, WinRho, and Rituxan. She is status post splenectomy in 2007.  She received IVIG and Nplate she received while at [facility] with significant improvement of her platelets.  If Nplate no longer was effective, could retry IVIG at a future date.  Patient is currently receiving the max dose of Nplate at 10 mcg/kg.  Can also consider Promacta 12.'5mg'$  or Tavalisse 100 mg BID if needed.  Patient's platelet count is decreased to 17.  Proceed with Nplate today.  Return to clinic in 4 weeks for further evaluation and continuation  of treatment.   2. Weakness and  fatigue: Chronic and unchanged. 3.  Depression/grief: Secondary to the recent death of her son.  Monitor.  Appreciate palliative care input.   4.  Poor appetite/weight loss: Improving.  Secondary to grief. 5.  Dementia: Chronic and unchanged.  Patient is now at [employer]. 6.  Leukocytosis: Resolved.  Patient expressed understanding and was in agreement with this plan. She also understands that She can call clinic at any time with any questions, concerns, or complaints.    Lloyd Huger, MD   04/17/2020 2:18 PM

## 2020-04-16 ENCOUNTER — Encounter: Payer: Self-pay | Admitting: Oncology

## 2020-04-16 NOTE — Progress Notes (Signed)
Patient called for pre assessment. Patient's husband reports no concerns at this time.

## 2020-04-17 ENCOUNTER — Inpatient Hospital Stay (HOSPITAL_BASED_OUTPATIENT_CLINIC_OR_DEPARTMENT_OTHER): Payer: Medicare HMO | Admitting: Hospice and Palliative Medicine

## 2020-04-17 ENCOUNTER — Inpatient Hospital Stay: Payer: Medicare HMO

## 2020-04-17 ENCOUNTER — Other Ambulatory Visit: Payer: Self-pay

## 2020-04-17 ENCOUNTER — Encounter: Payer: Self-pay | Admitting: Oncology

## 2020-04-17 ENCOUNTER — Inpatient Hospital Stay: Payer: Medicare HMO | Attending: Oncology | Admitting: Oncology

## 2020-04-17 VITALS — BP 116/73 | HR 64 | Temp 99.1°F | Resp 20 | Ht 66.0 in | Wt 124.7 lb

## 2020-04-17 DIAGNOSIS — R630 Anorexia: Secondary | ICD-10-CM

## 2020-04-17 DIAGNOSIS — Z515 Encounter for palliative care: Secondary | ICD-10-CM

## 2020-04-17 DIAGNOSIS — Z79899 Other long term (current) drug therapy: Secondary | ICD-10-CM | POA: Diagnosis not present

## 2020-04-17 DIAGNOSIS — F4329 Adjustment disorder with other symptoms: Secondary | ICD-10-CM | POA: Diagnosis not present

## 2020-04-17 DIAGNOSIS — D693 Immune thrombocytopenic purpura: Secondary | ICD-10-CM | POA: Diagnosis not present

## 2020-04-17 LAB — CBC WITH DIFFERENTIAL/PLATELET
Abs Immature Granulocytes: 0.02 10*3/uL (ref 0.00–0.07)
Basophils Absolute: 0.1 10*3/uL (ref 0.0–0.1)
Basophils Relative: 1 %
Eosinophils Absolute: 0.2 10*3/uL (ref 0.0–0.5)
Eosinophils Relative: 2 %
HCT: 38.5 % (ref 36.0–46.0)
Hemoglobin: 12.9 g/dL (ref 12.0–15.0)
Immature Granulocytes: 0 %
Lymphocytes Relative: 28 %
Lymphs Abs: 2.5 10*3/uL (ref 0.7–4.0)
MCH: 30.1 pg (ref 26.0–34.0)
MCHC: 33.5 g/dL (ref 30.0–36.0)
MCV: 90 fL (ref 80.0–100.0)
Monocytes Absolute: 0.7 10*3/uL (ref 0.1–1.0)
Monocytes Relative: 8 %
Neutro Abs: 5.5 10*3/uL (ref 1.7–7.7)
Neutrophils Relative %: 61 %
Platelets: 17 10*3/uL — CL (ref 150–400)
RBC: 4.28 MIL/uL (ref 3.87–5.11)
RDW: 15.9 % — ABNORMAL HIGH (ref 11.5–15.5)
WBC: 8.9 10*3/uL (ref 4.0–10.5)
nRBC: 0 % (ref 0.0–0.2)

## 2020-04-17 MED ORDER — ROMIPLOSTIM INJECTION 500 MCG
10.0000 ug/kg | Freq: Once | SUBCUTANEOUS | Status: AC
  Administered 2020-04-17: 565 ug via SUBCUTANEOUS
  Filled 2020-04-17: qty 1.13

## 2020-04-17 NOTE — Progress Notes (Signed)
[place]  [phone] [phone]   Name: Ariana [name] Date: 04/17/2020 MRN: [id number]  DOB: 05-28-1933  Patient Care Team: Idelle Crouch, MD as PCP - General (Internal Medicine) Lloyd Huger, MD as Consulting Physician (Oncology)    REASON FOR CONSULTATION: Ariana [name] is a 86 y.o. female with multiple medical problems including chronic refractory ITP on Nplate, Alzheimer's dementia, OSA, and anxiety/depression.  Patient was hospitalized 02/15/2020-02/18/2020 with failure to thrive following the death of her son.  Patient was referred to palliative care to address goals and provide with support.  SOCIAL HISTORY:     reports that she has never smoked. She has never used smokeless tobacco. She reports that she does not drink alcohol and does not use drugs.   Patient is married and lives at home with her husband.  Both of patient's children are now deceased she previously worked in [employer].  ADVANCE DIRECTIVES:  None on file  CODE STATUS:   PAST MEDICAL HISTORY: Past Medical History:  Diagnosis Date  . Alzheimer disease ([place])   . Anemia   . Anxiety   . Asthma   . Chronic back pain   . Depression   . Depression   . GERD (gastroesophageal reflux disease)   . Hiatal hernia   . Hypercholesteremia   . ITP (idiopathic thrombocytopenic purpura)   . Osteoarthritis   . Osteoporosis   . Pulmonary emboli ([place])   . Restless legs     PAST SURGICAL HISTORY:  Past Surgical History:  Procedure Laterality Date  . CARPAL TUNNEL RELEASE    . ESOPHAGOGASTRODUODENOSCOPY (EGD) WITH PROPOFOL N/A 09/10/2015   Procedure: ESOPHAGOGASTRODUODENOSCOPY (EGD) WITH PROPOFOL;  Surgeon: Lollie Sails, MD;  Location[facility] ENDOSCOPY;  Service: Endoscopy;  Laterality: N/A;  . IRRIGATION AND DEBRIDEMENT HEMATOMA Right 07/13/2018   Procedure: IRRIGATION AND DEBRIDEMENT HEMATOMA-RIGHT SHIN;   Surgeon: Herbert Pun, MD;  Location: [facility] ORS;  Service: General;  Laterality: Right;  . KYPHOPLASTY N/A 05/02/2019   Procedure: L1 KYPHOPLASTY;  Surgeon: Hessie Knows, MD;  Location: [facility] ORS;  Service: Orthopedics;  Laterality: N/A;  . LUMBAR [place]    . SPLENECTOMY, PARTIAL    . TOTAL ABDOMINAL HYSTERECTOMY      HEMATOLOGY/ONCOLOGY HISTORY:  Oncology History   No history exists.    ALLERGIES:  is allergic to aspirin, diazepam, other, tetanus toxoids, and morphine.  MEDICATIONS:  Current Outpatient Medications  Medication Sig Dispense Refill  . acetaminophen (TYLENOL) 325 MG tablet Take 2 tablets (650 mg total) by mouth every 6 (six) hours as needed for mild pain (or Fever >/= 101). 100 tablet 0  . albuterol (PROVENTIL HFA;VENTOLIN HFA) 108 (90 BASE) MCG/ACT inhaler Inhale 2 puffs into the lungs every 6 (six) hours as needed for wheezing or shortness of breath.    . ALPRAZolam (XANAX) 0.5 MG tablet Take 1 tablet (0.5 mg total) by mouth 3 (three) times daily as needed for anxiety. 20 tablet 0  . buPROPion (WELLBUTRIN XL) 150 MG 24 hr tablet Take 150 mg by mouth daily.    . busPIRone (BUSPAR) 15 MG tablet Take 15 mg by mouth 2 (two) times daily.     . Calcium Carbonate-Vit D-Min (CALCIUM 600+D PLUS MINERALS) 600-400 MG-UNIT TABS Take 1 tablet by mouth daily.     . Carboxymethylcellul-Glycerin (LUBRICATING EYE DROPS OP) Place 1 drop into both eyes daily as needed (dry eyes).    . cholecalciferol (VITAMIN D3) 25 MCG (  1000 UT) tablet Take 1,000 Units by mouth 2 (two) times a day.    . citalopram (CELEXA) 40 MG tablet Take 40 mg by mouth daily.    . Cyanocobalamin (RA VITAMIN B-12 TR) 1000 MCG TBCR Take 1,000 mcg by mouth daily.     Marland Kitchen donepezil (ARICEPT) 10 MG tablet Take 10 mg by mouth at bedtime.     . feeding supplement, ENSURE ENLIVE, (ENSURE ENLIVE) LIQD Take 237 mLs by mouth 2 (two) times daily between meals. 237 mL 12  . fluticasone (FLONASE) 50 MCG/ACT nasal spray  Place 2 sprays into both nostrils daily as needed for allergies.     . Fluticasone-Salmeterol (ADVAIR) 250-50 MCG/DOSE AEPB Inhale 1 puff into the lungs 2 (two) times daily as needed (shortness of breath).     . gabapentin (NEURONTIN) 300 MG capsule Take 300 mg by mouth 4 (four) times daily.    Marland Kitchen levothyroxine (SYNTHROID, LEVOTHROID) 50 MCG tablet Take 50 mcg by mouth daily before breakfast. Take 30 to 60 minutes before breakfast.    . memantine (NAMENDA) 10 MG tablet Take 10 mg by mouth daily.     . montelukast (SINGULAIR) 10 MG tablet Take 10 mg by mouth at bedtime.     . Multiple Vitamin (MULTI-VITAMIN) tablet Take 1 tablet by mouth daily.     . Multiple Vitamins-Minerals (PRESERVISION AREDS 2) CAPS Take 1 capsule by mouth 2 (two) times a day.    Marland Kitchen NARCAN 4 MG/0.1ML LIQD nasal spray kit Place 1 spray into the nose once.     Marland Kitchen oxybutynin (DITROPAN) 5 MG tablet Take 5 mg by mouth 3 (three) times daily.    Marland Kitchen oxyCODONE-acetaminophen (PERCOCET) 10-325 MG tablet Take 1 tablet by mouth every 6 (six) hours as needed for moderate pain or severe pain.    . pantoprazole (PROTONIX) 20 MG tablet Take 20 mg by mouth 2 (two) times daily.     . pravastatin (PRAVACHOL) 20 MG tablet Take 20 mg by mouth daily.    . vitamin C (ASCORBIC ACID) 500 MG tablet Take 500 mg by mouth daily.    . vitamin E (E-400) 400 UNIT capsule Take 400 Units by mouth daily.     No current facility-administered medications for this visit.    VITAL SIGNS: There were no vitals taken for this visit. There were no vitals filed for this visit.  Estimated body mass index is 20.13 kg/m as calculated from the following:   Height as of an earlier encounter on 04/17/20: 5\' 6"  (1.676 m).   Weight as of an earlier encounter on 04/17/20: 124 lb 11.2 oz (56.6 kg).  LABS: CBC:    Component Value Date/Time   WBC 8.9 04/17/2020 0958   HGB 12.9 04/17/2020 0958   HGB 14.2 01/18/2015 0941   HCT 38.5 04/17/2020 0958   HCT 43.0 01/18/2015 0941    PLT 17 (LL) 04/17/2020 0958   PLT 97 (L) 02/04/2015 0949   MCV 90.0 04/17/2020 0958   MCV 90 01/18/2015 0941   NEUTROABS 5.5 04/17/2020 0958   NEUTROABS 6.5 01/18/2015 0941   LYMPHSABS 2.5 04/17/2020 0958   LYMPHSABS 2.3 01/18/2015 0941   MONOABS 0.7 04/17/2020 0958   MONOABS 0.7 01/18/2015 0941   EOSABS 0.2 04/17/2020 0958   EOSABS 0.1 01/18/2015 0941   BASOSABS 0.1 04/17/2020 0958   BASOSABS 0.1 01/18/2015 0941   Comprehensive Metabolic Panel:    Component Value Date/Time   NA 142 03/18/2020 1135   NA 143 06/13/2014 1428  K 3.6 03/18/2020 1135   K 4.2 06/13/2014 1428   CL 107 03/18/2020 1135   CL 105 06/13/2014 1428   CO2 26 03/18/2020 1135   CO2 31 06/13/2014 1428   BUN 13 03/18/2020 1135   BUN 15 06/13/2014 1428   CREATININE 0.90 03/18/2020 1135   CREATININE 1.05 06/13/2014 1428   GLUCOSE 83 03/18/2020 1135   GLUCOSE 70 06/13/2014 1428   CALCIUM 9.0 03/18/2020 1135   CALCIUM 8.5 06/13/2014 1428   AST 22 03/18/2020 1135   AST 31 06/01/2014 1109   ALT 12 03/18/2020 1135   ALT 76 (H) 06/01/2014 1109   ALKPHOS 66 03/18/2020 1135   ALKPHOS 93 06/01/2014 1109   BILITOT 1.1 03/18/2020 1135   BILITOT 0.3 06/01/2014 1109   PROT 6.2 (L) 03/18/2020 1135   PROT 6.5 06/01/2014 1109   ALBUMIN 3.8 03/18/2020 1135   ALBUMIN 3.4 06/01/2014 1109    RADIOGRAPHIC STUDIES: CT Head Wo Contrast  Result Date: 03/18/2020 CLINICAL DATA:  Headache. Multiple falls in the last week. History of dementia. EXAM: CT HEAD WITHOUT CONTRAST TECHNIQUE: Contiguous axial images were obtained from the base of the skull through the vertex without intravenous contrast. COMPARISON:  CT head dated July 10, 2019. FINDINGS: Brain: No evidence of acute infarction, hemorrhage, hydrocephalus, extra-axial collection or mass lesion/mass effect. Stable atrophy and chronic microvascular ischemic changes. Vascular: Calcified atherosclerosis at the skullbase. No hyperdense vessel. Skull: Normal. Negative for  fracture or focal lesion. Sinuses/Orbits: No acute finding. Other: None. IMPRESSION: 1. No acute intracranial abnormality. 2. Stable atrophy and chronic microvascular ischemic changes. Electronically Signed   By: Titus Dubin M.D.   On: 03/18/2020 14:12    PERFORMANCE STATUS (ECOG) : 1 - Symptomatic but completely ambulatory  Review of Systems Unless otherwise noted, a complete review of systems is negative.  Physical Exam General: NAD, thin Pulmonary: Unlabored Extremities: no edema, no joint deformities Skin: no rashes Neurological: Weakness but otherwise nonfocal  IMPRESSION: Met with patient and her husband following their visit with Dr. Grayland Ormond.  I introduced palliative care services and attempted to establish therapeutic rapport.  Most of my questions were answered by patient's husband patient was able to engage with me in some conversation.  Reportedly, patient is doing better following her recent hospitalization in 2020/03/12.  Her son died in 02/11/2023.  Patient feels that she is coping better with loss.  She feels like her depression is currently stable.  Patient is currently on BuSpar, citalopram, and Wellbutrin.  She denies regular depressive thoughts, SI/HI, or insomnia.  However, husband feels that patient's poor appetite is likely secondary to grief/depression.  He says that she has good days and bad.  I do note that she has had slow decline in weight over the past couple of months.  We discussed the importance of high-calorie/high-protein foods.  She is drinking an Ensure daily.  I suggested that she increase to Ensure Plus for added calorie/protein.  We will also refer to dietitian.  Husband says the patient has ACP documents at home.  I requested that he bring those in to the clinic to be scanned into her chart.  We discussed the completion of a MOST form, which they took home for review.  PLAN: -Continue current scope of treatment -Continue antidepressants -Increase oral  supplements -Referral to dietitian -RTC in 1 month   Patient expressed understanding and was in agreement with this plan. She also understands that She can call the clinic at any time with  any questions, concerns, or complaints.     Time Total: 20 minutes  Visit consisted of counseling and education dealing with the complex and emotionally intense issues of symptom management and palliative care in the setting of serious and potentially life-threatening illness.Greater than 50%  of this time was spent counseling and coordinating care related to the above assessment and plan.  Signed by: Altha Harm, PhD, NP-C

## 2020-04-29 ENCOUNTER — Telehealth: Payer: Self-pay

## 2020-04-29 DIAGNOSIS — M19012 Primary osteoarthritis, left shoulder: Secondary | ICD-10-CM | POA: Diagnosis not present

## 2020-04-29 DIAGNOSIS — S46012A Strain of muscle(s) and tendon(s) of the rotator cuff of left shoulder, initial encounter: Secondary | ICD-10-CM | POA: Diagnosis not present

## 2020-04-29 DIAGNOSIS — M25512 Pain in left shoulder: Secondary | ICD-10-CM | POA: Diagnosis not present

## 2020-04-29 NOTE — Telephone Encounter (Signed)
Nutrition  Referral received from [place], NP weight loss.    Called and spoke with husband.  Currently in the doctor's office and unable to speak on the phone.  Husband has other errands to run later today.  Requested phone call another day.    Ariana [name] B. Zenia Resides, [place], [place] Registered Dietitian [phone] (pager)

## 2020-04-30 ENCOUNTER — Other Ambulatory Visit: Payer: Self-pay | Admitting: Student

## 2020-04-30 DIAGNOSIS — M19012 Primary osteoarthritis, left shoulder: Secondary | ICD-10-CM

## 2020-05-02 ENCOUNTER — Telehealth: Payer: Self-pay

## 2020-05-02 NOTE — Telephone Encounter (Signed)
Nutrition Assessment   Reason for Assessment: Referral from [place], NP for weight loss, poor appetite   ASSESSMENT:  86 year old female with idiopathic thrombocytopenic purpura.  Past medical history of anxiety, dementia, depression, GERD, HLD, hiatal hernia  Spoke with husband and reports appetite has improved.  Reports that she has gained weight when she was at the doctor on 7/19.  Husband reports that she is going to have a shoulder repair coming up soon.  Reports that for breakfast she drinks and ensure plus. Lunch is usually cereal and supper is sandwich (meat).  Drinks about 2 ensure plus per day.  Noted per chart grief/depressing and poor intake due to death of son.   Medications: Vit D and B 12, protonix, Vit C and Vit E   Labs: reviewed   Anthropometrics:   Height: 66 inches Weight: 124 lb 11.2 oz on 7/7 Husband notes 128 lb on 7/19 at MD office UBW: 136 lb per husband Noted 136 lb in 3/5 BMI: 20  9% weight loss in the last 4 months, signficant   Estimated Energy Needs  Kcals: 1680-1900 Protein: 84-95 g Fluid: > 1.6 L   NUTRITION DIAGNOSIS: Inadequate oral intake related grief/depression as evidenced by 9% weight loss in the last 4 months    INTERVENTION:  Discussed strategies to increase calories and protein with husband. Will mail handout Discussed importance of good nutrition prior to shoulder surgery.  Encouraged to continue 350 calorie shake as able  Husband provided with contact information   MONITORING, EVALUATION, GOAL: weight trends, intake   Next Visit: August 26 phone f/u  Thatiana Renbarger B. Zenia Resides, [place], [place] Registered Dietitian [phone] (mobile)

## 2020-05-05 ENCOUNTER — Other Ambulatory Visit: Payer: Self-pay

## 2020-05-05 ENCOUNTER — Ambulatory Visit
Admission: RE | Admit: 2020-05-05 | Discharge: 2020-05-05 | Disposition: A | Discharge status: Home / Self Care | Payer: Medicare HMO | Source: Ambulatory Visit | Attending: Student | Admitting: Student

## 2020-05-05 DIAGNOSIS — M19012 Primary osteoarthritis, left shoulder: Secondary | ICD-10-CM

## 2020-05-05 DIAGNOSIS — R600 Localized edema: Secondary | ICD-10-CM | POA: Diagnosis not present

## 2020-05-05 DIAGNOSIS — M75112 Incomplete rotator cuff tear or rupture of left shoulder, not specified as traumatic: Secondary | ICD-10-CM | POA: Diagnosis not present

## 2020-05-12 NOTE — Progress Notes (Signed)
[place]  [phone]  [phone]     Ariana [name] DOB: 1933/05/19  MR#: [id number]  QIW#:[id number]  Patient Care Team: Idelle Crouch, MD as PCP - General (Internal Medicine) Lloyd Huger, MD as Consulting Physician (Oncology)  CHIEF COMPLAINT: Chronic refractory ITP  INTERVAL HISTORY: Patient returns to clinic today for repeat laboratory, further evaluation, and continuation of Nplate.  She continues to recover from her episode of grief and depression after the death of her son several months ago.  She currently feels well and is asymptomatic.  Her appetite is improved and she continues to gain weight.  She continues to have memory loss.  She denies any falls in the interim. She has no neurologic complaints.  She denies any fevers.  She denies any chest pain, shortness of breath, cough, or hemoptysis.  She denies any nausea, vomiting, constipation, or diarrhea.  She has no melena or hematochezia.  She has no urinary complaints.  Patient offers no further specific complaints today.  REVIEW OF SYSTEMS:   Review of Systems  Constitutional: Positive for malaise/fatigue. Negative for fever and weight loss.  HENT: Negative for congestion.   Respiratory: Negative.  Negative for cough, hemoptysis and shortness of breath.   Cardiovascular: Negative.  Negative for chest pain and leg swelling.  Gastrointestinal: Negative.  Negative for abdominal pain, blood in stool, diarrhea, melena and nausea.  Genitourinary: Negative.  Negative for dysuria and hematuria.  Musculoskeletal: Negative.  Negative for back pain and falls.  Skin: Negative.  Negative for rash.  Neurological: Positive for weakness. Negative for dizziness, sensory change, focal weakness and headaches.  Endo/Heme/Allergies: Does not bruise/bleed easily.  Psychiatric/Behavioral: Positive for memory loss. Negative for depression. The patient is not nervous/anxious.     As per  HPI. Otherwise, a complete review of systems is negative.  PAST MEDICAL HISTORY: Past Medical History:  Diagnosis Date  . Alzheimer disease ([place])   . Anemia   . Anxiety   . Asthma   . Chronic back pain   . Depression   . Depression   . GERD (gastroesophageal reflux disease)   . Hiatal hernia   . Hypercholesteremia   . ITP (idiopathic thrombocytopenic purpura)   . Osteoarthritis   . Osteoporosis   . Pulmonary emboli ([place])   . Restless legs     PAST SURGICAL HISTORY: Past Surgical History:  Procedure Laterality Date  . CARPAL TUNNEL RELEASE    . ESOPHAGOGASTRODUODENOSCOPY (EGD) WITH PROPOFOL N/A 09/10/2015   Procedure: ESOPHAGOGASTRODUODENOSCOPY (EGD) WITH PROPOFOL;  Surgeon: Lollie Sails, MD;  Location: [facility] ENDOSCOPY;  Service: Endoscopy;  Laterality: N/A;  . IRRIGATION AND DEBRIDEMENT HEMATOMA Right 07/13/2018   Procedure: IRRIGATION AND DEBRIDEMENT HEMATOMA-RIGHT SHIN;  Surgeon: Herbert Pun, MD;  Location: [facility] ORS;  Service: General;  Laterality: Right;  . KYPHOPLASTY N/A 05/02/2019   Procedure: L1 KYPHOPLASTY;  Surgeon: Hessie Knows, MD;  Location: [facility] ORS;  Service: Orthopedics;  Laterality: N/A;  . LUMBAR [place]    . SPLENECTOMY, PARTIAL    . TOTAL ABDOMINAL HYSTERECTOMY      FAMILY HISTORY Family History  Problem Relation Age of Onset  . Stroke Mother     GYNECOLOGIC HISTORY:  No LMP recorded. Patient is postmenopausal.     ADVANCED DIRECTIVES:    HEALTH MAINTENANCE: Social History   Tobacco Use  . Smoking status: Never Smoker  . Smokeless tobacco: Never Used  Substance Use Topics  . Alcohol use: No    Alcohol/week:  0.0 standard drinks  . Drug use: No     Allergies  Allergen Reactions  . Aspirin     Upset stomach  . Diazepam Itching    sneezing  . Other     seasonal allergies  . Tetanus Toxoids     Localized superficial swelling of skin  . Morphine Itching and Rash    Current Outpatient Medications  Medication Sig  Dispense Refill  . acetaminophen (TYLENOL) 325 MG tablet Take 2 tablets (650 mg total) by mouth every 6 (six) hours as needed for mild pain (or Fever >/= 101). 100 tablet 0  . albuterol (PROVENTIL HFA;VENTOLIN HFA) 108 (90 BASE) MCG/ACT inhaler Inhale 2 puffs into the lungs every 6 (six) hours as needed for wheezing or shortness of breath.    . ALPRAZolam (XANAX) 0.5 MG tablet Take 1 tablet (0.5 mg total) by mouth 3 (three) times daily as needed for anxiety. 20 tablet 0  . buPROPion (WELLBUTRIN XL) 150 MG 24 hr tablet Take 150 mg by mouth daily.    . busPIRone (BUSPAR) 15 MG tablet Take 15 mg by mouth 2 (two) times daily.     . Calcium Carbonate-Vit D-Min (CALCIUM 600+D PLUS MINERALS) 600-400 MG-UNIT TABS Take 1 tablet by mouth daily.     . Carboxymethylcellul-Glycerin (LUBRICATING EYE DROPS OP) Place 1 drop into both eyes daily as needed (dry eyes).    . cholecalciferol (VITAMIN D3) 25 MCG (1000 UT) tablet Take 1,000 Units by mouth 2 (two) times a day.    . citalopram (CELEXA) 40 MG tablet Take 40 mg by mouth daily.    . Cyanocobalamin (RA VITAMIN B-12 TR) 1000 MCG TBCR Take 1,000 mcg by mouth daily.     Marland Kitchen donepezil (ARICEPT) 10 MG tablet Take 10 mg by mouth at bedtime.     . feeding supplement, ENSURE ENLIVE, (ENSURE ENLIVE) LIQD Take 237 mLs by mouth 2 (two) times daily between meals. 237 mL 12  . fluticasone (FLONASE) 50 MCG/ACT nasal spray Place 2 sprays into both nostrils daily as needed for allergies.     . Fluticasone-Salmeterol (ADVAIR) 250-50 MCG/DOSE AEPB Inhale 1 puff into the lungs 2 (two) times daily as needed (shortness of breath).     . gabapentin (NEURONTIN) 300 MG capsule Take 300 mg by mouth 4 (four) times daily.    Marland Kitchen levothyroxine (SYNTHROID, LEVOTHROID) 50 MCG tablet Take 50 mcg by mouth daily before breakfast. Take 30 to 60 minutes before breakfast.    . memantine (NAMENDA) 10 MG tablet Take 10 mg by mouth daily.     . montelukast (SINGULAIR) 10 MG tablet Take 10 mg by mouth  at bedtime.     . Multiple Vitamin (MULTI-VITAMIN) tablet Take 1 tablet by mouth daily.     . Multiple Vitamins-Minerals (PRESERVISION AREDS 2) CAPS Take 1 capsule by mouth 2 (two) times a day.    Marland Kitchen NARCAN 4 MG/0.1ML LIQD nasal spray kit Place 1 spray into the nose once.     Marland Kitchen oxybutynin (DITROPAN) 5 MG tablet Take 5 mg by mouth 3 (three) times daily.    Marland Kitchen oxyCODONE-acetaminophen (PERCOCET) 10-325 MG tablet Take 1 tablet by mouth every 6 (six) hours as needed for moderate pain or severe pain.    . pantoprazole (PROTONIX) 20 MG tablet Take 20 mg by mouth 2 (two) times daily.     . pravastatin (PRAVACHOL) 20 MG tablet Take 20 mg by mouth daily.    . vitamin C (ASCORBIC ACID) 500 MG tablet  Take 500 mg by mouth daily.    . vitamin E (E-400) 400 UNIT capsule Take 400 Units by mouth daily.     No current facility-administered medications for this visit.    OBJECTIVE: BP 140/79 (BP Location: Left Arm, Patient Position: Sitting)   Pulse 62   Temp 98.6 F (37 C) (Tympanic)   Wt 129 lb 14.4 oz (58.9 kg)   SpO2 100%   BMI 20.97 kg/m    Body mass index is 20.97 kg/m.    ECOG FS:1 - Symptomatic but completely ambulatory  General: Thin, no acute distress. Eyes: Pink conjunctiva, anicteric sclera. HEENT: Normocephalic, moist mucous membranes. Lungs: No audible wheezing or coughing. Heart: Regular rate and rhythm. Abdomen: Soft, nontender, no obvious distention. Musculoskeletal: No edema, cyanosis, or clubbing. Neuro: Alert, answering all questions appropriately. Cranial nerves grossly intact. Skin: No rashes or petechiae noted. Psych: Normal affect.  LAB RESULTS:  Appointment on 05/15/2020  Component Date Value Ref Range Status  . WBC 05/15/2020 11.4* 4.0 - 10.5 K/uL Final  . RBC 05/15/2020 3.99  3.87 - 5.11 MIL/uL Final  . Hemoglobin 05/15/2020 11.8* 12.0 - 15.0 g/dL Final  . HCT 05/15/2020 35.9* 36 - 46 % Final  . MCV 05/15/2020 90.0  80.0 - 100.0 fL Final  . MCH 05/15/2020 29.6  26.0  - 34.0 pg Final  . MCHC 05/15/2020 32.9  30.0 - 36.0 g/dL Final  . RDW 05/15/2020 15.9* 11.5 - 15.5 % Final  . Platelets 05/15/2020 27* 150 - 400 K/uL Final   Comment: REPEATED TO VERIFY PLATELET COUNT CONFIRMED BY SMEAR Immature Platelet Fraction may be clinically indicated, consider ordering this additional test [id number] THIS CRITICAL RESULT HAS VERIFIED AND BEEN CALLED TO DR. Nigel Wessman BY LONNIE RHONE ON 08 04 2021 AT 0952, AND HAS BEEN READ BACK. '@9'$ :41   . nRBC 05/15/2020 0.0  0.0 - 0.2 % Final  . Neutrophils Relative % 05/15/2020 65  % Final  . Neutro Abs 05/15/2020 7.3  1.7 - 7.7 K/uL Final  . Lymphocytes Relative 05/15/2020 25  % Final  . Lymphs Abs 05/15/2020 2.9  0.7 - 4.0 K/uL Final  . Monocytes Relative 05/15/2020 8  % Final  . Monocytes Absolute 05/15/2020 0.9  0 - 1 K/uL Final  . Eosinophils Relative 05/15/2020 1  % Final  . Eosinophils Absolute 05/15/2020 0.2  0 - 0 K/uL Final  . Basophils Relative 05/15/2020 1  % Final  . Basophils Absolute 05/15/2020 0.1  0 - 0 K/uL Final  . Smear Review 05/15/2020 PLATELET COUNT CONFIRMED BY SMEAR   Final   Comment: Reviewed PLATELETS VARY IN SIZE AND GRANULATION   . Immature Granulocytes 05/15/2020 0  % Final  . Abs Immature Granulocytes 05/15/2020 0.04  0.00 - 0.07 K/uL Final   Performed at [facility], [address] [place], [place] [address]    STUDIES: No results found.  ASSESSMENT:  Chronic refractory ITP  PLAN:   1. Chronic refractory ITP: Patient had a poor response to Prednisone, WinRho, and Rituxan. She is status post splenectomy in 2007.  She received IVIG and Nplate she received while at [facility] with significant improvement of her platelets.  If Nplate no longer was effective, could retry IVIG at a future date.  Patient is currently receiving the max dose of Nplate at 10 mcg/kg.  Can also consider Promacta 12.'5mg'$  or Tavalisse 100 mg BID if needed.  Patient platelet count is 27 today, therefore will proceed with  treatment.  Return  to clinic in 4 weeks for repeat laboratory, further evaluation and continuation of Nplate. 2. Weakness and fatigue: Chronic and unchanged. 3.  Depression/grief: Improved.  Secondary to the recent death of her son.  Monitor.  Appreciate palliative care input.   4.  Poor appetite/weight loss: Improved. 5.  Dementia: Chronic and unchanged.  Patient is now at [employer]. 6.  Leukocytosis: White blood cell count is mildly elevated 11.4, monitor.  Patient expressed understanding and was in agreement with this plan. She also understands that She can call clinic at any time with any questions, concerns, or complaints.    Lloyd Huger, MD   05/15/2020 2:42 PM

## 2020-05-14 ENCOUNTER — Encounter: Payer: Self-pay | Admitting: Oncology

## 2020-05-15 ENCOUNTER — Inpatient Hospital Stay: Payer: Medicare HMO | Admitting: Oncology

## 2020-05-15 ENCOUNTER — Inpatient Hospital Stay: Payer: Medicare HMO

## 2020-05-15 ENCOUNTER — Inpatient Hospital Stay (HOSPITAL_BASED_OUTPATIENT_CLINIC_OR_DEPARTMENT_OTHER): Payer: Medicare HMO | Admitting: Hospice and Palliative Medicine

## 2020-05-15 ENCOUNTER — Other Ambulatory Visit: Payer: Self-pay

## 2020-05-15 ENCOUNTER — Inpatient Hospital Stay: Payer: Medicare HMO | Attending: Oncology

## 2020-05-15 VITALS — BP 140/79 | HR 62 | Temp 98.6°F | Wt 129.9 lb

## 2020-05-15 DIAGNOSIS — J189 Pneumonia, unspecified organism: Secondary | ICD-10-CM | POA: Diagnosis not present

## 2020-05-15 DIAGNOSIS — J441 Chronic obstructive pulmonary disease with (acute) exacerbation: Secondary | ICD-10-CM | POA: Diagnosis not present

## 2020-05-15 DIAGNOSIS — D693 Immune thrombocytopenic purpura: Secondary | ICD-10-CM

## 2020-05-15 DIAGNOSIS — F32A Depression, unspecified: Secondary | ICD-10-CM

## 2020-05-15 DIAGNOSIS — Z79899 Other long term (current) drug therapy: Secondary | ICD-10-CM | POA: Diagnosis not present

## 2020-05-15 DIAGNOSIS — J440 Chronic obstructive pulmonary disease with acute lower respiratory infection: Secondary | ICD-10-CM | POA: Diagnosis not present

## 2020-05-15 DIAGNOSIS — F4321 Adjustment disorder with depressed mood: Secondary | ICD-10-CM | POA: Diagnosis not present

## 2020-05-15 DIAGNOSIS — R05 Cough: Secondary | ICD-10-CM | POA: Diagnosis not present

## 2020-05-15 DIAGNOSIS — F329 Major depressive disorder, single episode, unspecified: Secondary | ICD-10-CM | POA: Diagnosis not present

## 2020-05-15 DIAGNOSIS — Z515 Encounter for palliative care: Secondary | ICD-10-CM | POA: Diagnosis not present

## 2020-05-15 DIAGNOSIS — J209 Acute bronchitis, unspecified: Secondary | ICD-10-CM | POA: Diagnosis not present

## 2020-05-15 DIAGNOSIS — J449 Chronic obstructive pulmonary disease, unspecified: Secondary | ICD-10-CM | POA: Diagnosis not present

## 2020-05-15 DIAGNOSIS — J180 Bronchopneumonia, unspecified organism: Secondary | ICD-10-CM | POA: Diagnosis not present

## 2020-05-15 LAB — CBC WITH DIFFERENTIAL/PLATELET
Abs Immature Granulocytes: 0.04 10*3/uL (ref 0.00–0.07)
Basophils Absolute: 0.1 10*3/uL (ref 0.0–0.1)
Basophils Relative: 1 %
Eosinophils Absolute: 0.2 10*3/uL (ref 0.0–0.5)
Eosinophils Relative: 1 %
HCT: 35.9 % — ABNORMAL LOW (ref 36.0–46.0)
Hemoglobin: 11.8 g/dL — ABNORMAL LOW (ref 12.0–15.0)
Immature Granulocytes: 0 %
Lymphocytes Relative: 25 %
Lymphs Abs: 2.9 10*3/uL (ref 0.7–4.0)
MCH: 29.6 pg (ref 26.0–34.0)
MCHC: 32.9 g/dL (ref 30.0–36.0)
MCV: 90 fL (ref 80.0–100.0)
Monocytes Absolute: 0.9 10*3/uL (ref 0.1–1.0)
Monocytes Relative: 8 %
Neutro Abs: 7.3 10*3/uL (ref 1.7–7.7)
Neutrophils Relative %: 65 %
Platelets: 27 10*3/uL — CL (ref 150–400)
RBC: 3.99 MIL/uL (ref 3.87–5.11)
RDW: 15.9 % — ABNORMAL HIGH (ref 11.5–15.5)
WBC: 11.4 10*3/uL — ABNORMAL HIGH (ref 4.0–10.5)
nRBC: 0 % (ref 0.0–0.2)

## 2020-05-15 MED ORDER — ROMIPLOSTIM INJECTION 500 MCG
10.0000 ug/kg | Freq: Once | SUBCUTANEOUS | Status: AC
  Administered 2020-05-15: 590 ug via SUBCUTANEOUS
  Filled 2020-05-15: qty 1.18

## 2020-05-15 NOTE — Progress Notes (Signed)
[place]  [phone] [phone]   Name: Ariana [name] Date: 05/15/2020 MRN: [id number]  DOB: [birth year]  Patient Care Team: Idelle Crouch, MD as PCP - General (Internal Medicine) Lloyd Huger, MD as Consulting Physician (Oncology)    REASON FOR CONSULTATION: Ariana [name] is a 86 y.o. female with multiple medical problems including chronic refractory ITP on Nplate, Alzheimer's dementia, OSA, and anxiety/depression.  Patient was hospitalized 02/15/2020-02/18/2020 with failure to thrive following the death of her son.  Patient was referred to palliative care to address goals and provide with support.  SOCIAL HISTORY:     reports that she has never smoked. She has never used smokeless tobacco. She reports that she does not drink alcohol and does not use drugs.   Patient is married and lives at home with her husband.  Both of patient's children are now deceased she previously worked in [employer].  ADVANCE DIRECTIVES:  None on file  CODE STATUS:   PAST MEDICAL HISTORY: Past Medical History:  Diagnosis Date   Alzheimer disease ([place])    Anemia    Anxiety    Asthma    Chronic back pain    Depression    Depression    GERD (gastroesophageal reflux disease)    Hiatal hernia    Hypercholesteremia    ITP (idiopathic thrombocytopenic purpura)    Osteoarthritis    Osteoporosis    Pulmonary emboli ([facility])    Restless legs     PAST SURGICAL HISTORY:  Past Surgical History:  Procedure Laterality Date   CARPAL TUNNEL RELEASE     ESOPHAGOGASTRODUODENOSCOPY (EGD) WITH PROPOFOL N/A 09/10/2015   Procedure: ESOPHAGOGASTRODUODENOSCOPY (EGD) WITH PROPOFOL;  Surgeon: Lollie Sails, MD;  Location: [facility] ENDOSCOPY;  Service: Endoscopy;  Laterality: N/A;   IRRIGATION AND DEBRIDEMENT HEMATOMA Right 07/13/2018   Procedure: IRRIGATION AND DEBRIDEMENT HEMATOMA-RIGHT SHIN;   Surgeon: Herbert Pun, MD;  Location: [facility] ORS;  Service: General;  Laterality: Right;   KYPHOPLASTY N/A 05/02/2019   Procedure: L1 KYPHOPLASTY;  Surgeon: Hessie Knows, MD;  Location: [facility] ORS;  Service: Orthopedics;  Laterality: N/A;   LUMBAR DISC SURGERY     SPLENECTOMY, PARTIAL     TOTAL ABDOMINAL HYSTERECTOMY      HEMATOLOGY/ONCOLOGY HISTORY:  Oncology History   No history exists.    ALLERGIES:  is allergic to aspirin, diazepam, other, tetanus toxoids, and morphine.  MEDICATIONS:  Current Outpatient Medications  Medication Sig Dispense Refill   acetaminophen (TYLENOL) 325 MG tablet Take 2 tablets (650 mg total) by mouth every 6 (six) hours as needed for mild pain (or Fever >/= 101). 100 tablet 0   albuterol (PROVENTIL HFA;VENTOLIN HFA) 108 (90 BASE) MCG/ACT inhaler Inhale 2 puffs into the lungs every 6 (six) hours as needed for wheezing or shortness of breath.     ALPRAZolam (XANAX) 0.5 MG tablet Take 1 tablet (0.5 mg total) by mouth 3 (three) times daily as needed for anxiety. 20 tablet 0   buPROPion (WELLBUTRIN XL) 150 MG 24 hr tablet Take 150 mg by mouth daily.     busPIRone (BUSPAR) 15 MG tablet Take 15 mg by mouth 2 (two) times daily.      Calcium Carbonate-Vit D-Min (CALCIUM 600+D PLUS MINERALS) 600-400 MG-UNIT TABS Take 1 tablet by mouth daily.      Carboxymethylcellul-Glycerin (LUBRICATING EYE DROPS OP) Place 1 drop into both eyes daily as needed (dry eyes).     cholecalciferol (VITAMIN D3) 25 MCG (  1000 UT) tablet Take 1,000 Units by mouth 2 (two) times a day.     citalopram (CELEXA) 40 MG tablet Take 40 mg by mouth daily.     Cyanocobalamin (RA VITAMIN B-12 TR) 1000 MCG TBCR Take 1,000 mcg by mouth daily.      donepezil (ARICEPT) 10 MG tablet Take 10 mg by mouth at bedtime.      feeding supplement, ENSURE ENLIVE, (ENSURE ENLIVE) LIQD Take 237 mLs by mouth 2 (two) times daily between meals. 237 mL 12   fluticasone (FLONASE) 50 MCG/ACT nasal spray  Place 2 sprays into both nostrils daily as needed for allergies.      Fluticasone-Salmeterol (ADVAIR) 250-50 MCG/DOSE AEPB Inhale 1 puff into the lungs 2 (two) times daily as needed (shortness of breath).      gabapentin (NEURONTIN) 300 MG capsule Take 300 mg by mouth 4 (four) times daily.     levothyroxine (SYNTHROID, LEVOTHROID) 50 MCG tablet Take 50 mcg by mouth daily before breakfast. Take 30 to 60 minutes before breakfast.     memantine (NAMENDA) 10 MG tablet Take 10 mg by mouth daily.      montelukast (SINGULAIR) 10 MG tablet Take 10 mg by mouth at bedtime.      Multiple Vitamin (MULTI-VITAMIN) tablet Take 1 tablet by mouth daily.      Multiple Vitamins-Minerals (PRESERVISION AREDS 2) CAPS Take 1 capsule by mouth 2 (two) times a day.     NARCAN 4 MG/0.1ML LIQD nasal spray kit Place 1 spray into the nose once.      oxybutynin (DITROPAN) 5 MG tablet Take 5 mg by mouth 3 (three) times daily.     oxyCODONE-acetaminophen (PERCOCET) 10-325 MG tablet Take 1 tablet by mouth every 6 (six) hours as needed for moderate pain or severe pain.     pantoprazole (PROTONIX) 20 MG tablet Take 20 mg by mouth 2 (two) times daily.      pravastatin (PRAVACHOL) 20 MG tablet Take 20 mg by mouth daily.     vitamin C (ASCORBIC ACID) 500 MG tablet Take 500 mg by mouth daily.     vitamin E (E-400) 400 UNIT capsule Take 400 Units by mouth daily.     No current facility-administered medications for this visit.    VITAL SIGNS: There were no vitals taken for this visit. There were no vitals filed for this visit.  Estimated body mass index is 20.97 kg/m as calculated from the following:   Height as of 04/17/20: '5\' 6"'$  (1.676 m).   Weight as of an earlier encounter on 05/15/20: 129 lb 14.4 oz (58.9 kg).  LABS: CBC:    Component Value Date/Time   WBC 11.4 (H) 05/15/2020 0924   HGB 11.8 (L) 05/15/2020 0924   HGB 14.2 01/18/2015 0941   HCT 35.9 (L) 05/15/2020 0924   HCT 43.0 01/18/2015 0941   PLT 27  (LL) 05/15/2020 0924   PLT 97 (L) 02/04/2015 0949   MCV 90.0 05/15/2020 0924   MCV 90 01/18/2015 0941   NEUTROABS 7.3 05/15/2020 0924   NEUTROABS 6.5 01/18/2015 0941   LYMPHSABS 2.9 05/15/2020 0924   LYMPHSABS 2.3 01/18/2015 0941   MONOABS 0.9 05/15/2020 0924   MONOABS 0.7 01/18/2015 0941   EOSABS 0.2 05/15/2020 0924   EOSABS 0.1 01/18/2015 0941   BASOSABS 0.1 05/15/2020 0924   BASOSABS 0.1 01/18/2015 0941   Comprehensive Metabolic Panel:    Component Value Date/Time   NA 142 03/18/2020 1135   NA 143 06/13/2014 1428  K 3.6 03/18/2020 1135   K 4.2 06/13/2014 1428   CL 107 03/18/2020 1135   CL 105 06/13/2014 1428   CO2 26 03/18/2020 1135   CO2 31 06/13/2014 1428   BUN 13 03/18/2020 1135   BUN 15 06/13/2014 1428   CREATININE 0.90 03/18/2020 1135   CREATININE 1.05 06/13/2014 1428   GLUCOSE 83 03/18/2020 1135   GLUCOSE 70 06/13/2014 1428   CALCIUM 9.0 03/18/2020 1135   CALCIUM 8.5 06/13/2014 1428   AST 22 03/18/2020 1135   AST 31 06/01/2014 1109   ALT 12 03/18/2020 1135   ALT 76 (H) 06/01/2014 1109   ALKPHOS 66 03/18/2020 1135   ALKPHOS 93 06/01/2014 1109   BILITOT 1.1 03/18/2020 1135   BILITOT 0.3 06/01/2014 1109   PROT 6.2 (L) 03/18/2020 1135   PROT 6.5 06/01/2014 1109   ALBUMIN 3.8 03/18/2020 1135   ALBUMIN 3.4 06/01/2014 1109    RADIOGRAPHIC STUDIES: MR SHOULDER LEFT WO CONTRAST  Result Date: 05/06/2020 CLINICAL DATA:  Left shoulder pain. Discomfort for the past several years. EXAM: MRI OF THE LEFT SHOULDER WITHOUT CONTRAST TECHNIQUE: Multiplanar, multisequence MR imaging of the shoulder was performed. No intravenous contrast was administered. COMPARISON:  03/21/2008 FINDINGS: Rotator cuff: Moderate tendinosis of the supraspinatus tendon with a small partial articular surface tear. Mild tendinosis of the infraspinatus tendon. Teres minor tendon is intact. Mild tendinosis of the subscapularis tendon with a small partial-thickness tear. Muscles: No muscle atrophy or  edema. No intramuscular fluid collection or hematoma. Biceps Long Head: Moderate tendinosis of the intra-articular portion of the long head of the biceps tendon. Acromioclavicular Joint: Moderate arthropathy of the acromioclavicular joint. Type II acromion. No subacromial/subdeltoid bursal fluid. Glenohumeral Joint: Extensive full-thickness cartilage loss of the glenoid with subchondral reactive marrow edema. High-grade partial-thickness cartilage loss with areas of full-thickness cartilage loss of the humeral head with an inferior marginal osteophyte. Small joint effusion. Labrum: Labral degeneration with a tear of the posterosuperior labrum. Bones: No acute fracture or dislocation. No aggressive osseous lesion. Subcortical reactive marrow changes at the supraspinatus and infraspinatus insertion. Other: No fluid collection or hematoma. IMPRESSION: 1. Severe osteoarthritis of the left glenohumeral joint. 2. Moderate tendinosis of the supraspinatus tendon with a small partial articular surface tear. 3. Mild tendinosis of the infraspinatus tendon. 4. Mild tendinosis of the subscapularis tendon with a small partial-thickness tear. 5. Moderate tendinosis of the intra-articular portion of the long head of the biceps tendon. Electronically Signed   By: Elige Ko   On: 05/06/2020 09:56    PERFORMANCE STATUS (ECOG) : 1 - Symptomatic but completely ambulatory  Review of Systems Unless otherwise noted, a complete review of systems is negative.  Physical Exam General: NAD, thin Pulmonary: Unlabored Extremities: no edema, no joint deformities Skin: no rashes Neurological: Weakness but otherwise nonfocal  IMPRESSION: Routine follow-up visit today.  Patient was accompanied by her husband.  With patient and husband report that she is doing better.  They describe less depression and grief.  Oral intake in performance status are stable.  They declined any symptomatic complaints today.  No issues with medications  reported.    Discussed with Dr. Orlie Dakin who also feels patient is doing better.  PLAN: -Continue current scope of treatment -Continue antidepressants -Will benefit from future conversation about ACP/MOST form -RTC in 1 month   Patient expressed understanding and was in agreement with this plan. She also understands that She can call the clinic at any time with any questions, concerns, or complaints.  Time Total: 15 minutes  Visit consisted of counseling and education dealing with the complex and emotionally intense issues of symptom management and palliative care in the setting of serious and potentially life-threatening illness.Greater than 50%  of this time was spent counseling and coordinating care related to the above assessment and plan.  Signed by: Altha Harm, PhD, NP-C

## 2020-05-28 DIAGNOSIS — J4521 Mild intermittent asthma with (acute) exacerbation: Secondary | ICD-10-CM | POA: Diagnosis not present

## 2020-05-28 DIAGNOSIS — R05 Cough: Secondary | ICD-10-CM | POA: Diagnosis not present

## 2020-06-06 ENCOUNTER — Inpatient Hospital Stay: Payer: Medicare HMO

## 2020-06-06 NOTE — Progress Notes (Signed)
Nutrition Follow-up:  Patient with idiopathic thrombocytopenic purpura.  Followed by Dr Grayland Ormond and Palliative care  Spoke with husband via phone for nutrition follow-up.  Husband reports that patient's appetite has increased.  Reports that she is eating all during the day.  Likes egg sandwiches, cooked potato soup last night.  Snacks on cookies, peanut butter crackers.  Drinks carnation breakfast essentials powder mixed with milk typically in the am.  Denies any nutrition impact symptoms    Medications: reviewed  Labs: reviewed  Anthropometrics:   Weight 129 lb 14.4 oz on 8/4 increased from 124 lb 11.2 oz on 7/7  UBW 136 lb   NUTRITION DIAGNOSIS: Inadequate oral intake improving   INTERVENTION:  Encouraged high calorie, high protein food consumption to continue weight gain.   Encouraged oral nutrition supplements as able  Husband has contact information    MONITORING, EVALUATION, GOAL: weight trends, intake   NEXT VISIT: Oct 7 phone f/u  Doak Mah B. Zenia Resides, [place], [place] Registered Dietitian [phone] (mobile)

## 2020-06-08 NOTE — Progress Notes (Signed)
[place]  [phone]  [phone]     Ariana [name] DOB: 06-22-1933  MR#: [id number]  AOZ#:[id number]  Patient Care Team: Idelle Crouch, MD as PCP - General (Internal Medicine) Lloyd Huger, MD as Consulting Physician (Oncology)  I connected with [name] on 06/13/20 at 10:30 AM EDT by video enabled telemedicine visit and verified that I am speaking with the correct person using two identifiers.   I discussed the limitations, risks, security and privacy concerns of performing an evaluation and management service by telemedicine and the availability of in-person appointments. I also discussed with the patient that there may be a patient responsible charge related to this service. The patient expressed understanding and agreed to proceed.   Other persons participating in the visit and their role in the encounter: Patient, MD.  Patient's location: Clinic. Provider's location: Home.  CHIEF COMPLAINT: Chronic refractory ITP  INTERVAL HISTORY: Patient agreed to video assisted telemedicine visit for further evaluation and continuation of Nplate.  She continues to have chronic weakness and fatigue, but otherwise feels well.  She admits to easy bruising.  She continues to have a poor memory.  She denies any further falls. She has no neurologic complaints.  She denies any recent fevers or illnesses.  She denies any chest pain, shortness of breath, cough, or hemoptysis.  She denies any nausea, vomiting, constipation, or diarrhea.  She has no melena or hematochezia.  She has no urinary complaints.  Patient offers no further specific complaints today.  REVIEW OF SYSTEMS:   Review of Systems  Constitutional: Positive for malaise/fatigue. Negative for fever and weight loss.  HENT: Negative for congestion.   Respiratory: Negative.  Negative for cough, hemoptysis and shortness of breath.   Cardiovascular: Negative.  Negative  for chest pain and leg swelling.  Gastrointestinal: Negative.  Negative for abdominal pain, blood in stool, diarrhea, melena and nausea.  Genitourinary: Negative.  Negative for dysuria and hematuria.  Musculoskeletal: Negative.  Negative for back pain and falls.  Skin: Negative.  Negative for rash.  Neurological: Positive for weakness. Negative for dizziness, sensory change, focal weakness and headaches.  Endo/Heme/Allergies: Bruises/bleeds easily.  Psychiatric/Behavioral: Positive for memory loss. Negative for depression. The patient is not nervous/anxious.     As per HPI. Otherwise, a complete review of systems is negative.  PAST MEDICAL HISTORY: Past Medical History:  Diagnosis Date  . Alzheimer disease ([place])   . Anemia   . Anxiety   . Asthma   . Chronic back pain   . Depression   . Depression   . GERD (gastroesophageal reflux disease)   . Hiatal hernia   . Hypercholesteremia   . ITP (idiopathic thrombocytopenic purpura)   . Osteoarthritis   . Osteoporosis   . Pulmonary emboli ([place])   . Restless legs     PAST SURGICAL HISTORY: Past Surgical History:  Procedure Laterality Date  . CARPAL TUNNEL RELEASE    . ESOPHAGOGASTRODUODENOSCOPY (EGD) WITH PROPOFOL N/A 09/10/2015   Procedure: ESOPHAGOGASTRODUODENOSCOPY (EGD) WITH PROPOFOL;  Surgeon: Lollie Sails, MD;  Location: [facility] [place] END[facility]COPY;  Service: Endoscopy;  Laterality: N/A;  . IRRIGATION AND DEBRIDEMENT HEMATOMA Right 07/13/2018   Procedure: IRRIGATION AND DEBRIDEMENT HEMATOMA-RIGHT SHIN;  Surgeon: Herbert Pun, MD;  Location: [facility] ORS;  Service: General;  Laterality: Right;  . KYPHOPLASTY N/A 05/02/2019   Procedure: L1 KYPHOPLASTY;  Surgeon: Hessie Knows, MD;  Location: [facility] ORS;  Service: Orthopedics;  Laterality: N/A;  . LUMBAR [place]    .  SPLENECTOMY, PARTIAL    . TOTAL ABDOMINAL HYSTERECTOMY      FAMILY HISTORY Family History  Problem Relation Age of Onset  . Stroke Mother     GYNECOLOGIC  HISTORY:  No LMP recorded. Patient is postmenopausal.     ADVANCED DIRECTIVES:    HEALTH MAINTENANCE: Social History   Tobacco Use  . Smoking status: Never Smoker  . Smokeless tobacco: Never Used  Substance Use Topics  . Alcohol use: No    Alcohol/week: 0.0 standard drinks  . Drug use: No     Allergies  Allergen Reactions  . Aspirin     Upset stomach  . Diazepam Itching    sneezing  . Other     seasonal allergies  . Tetanus Toxoids     Localized superficial swelling of skin  . Morphine Itching and Rash    Current Outpatient Medications  Medication Sig Dispense Refill  . acetaminophen (TYLENOL) 325 MG tablet Take 2 tablets (650 mg total) by mouth every 6 (six) hours as needed for mild pain (or Fever >/= 101). 100 tablet 0  . albuterol (PROVENTIL HFA;VENTOLIN HFA) 108 (90 BASE) MCG/ACT inhaler Inhale 2 puffs into the lungs every 6 (six) hours as needed for wheezing or shortness of breath.    . ALPRAZolam (XANAX) 0.5 MG tablet Take 1 tablet (0.5 mg total) by mouth 3 (three) times daily as needed for anxiety. 20 tablet 0  . buPROPion (WELLBUTRIN XL) 150 MG 24 hr tablet Take 150 mg by mouth daily.    . busPIRone (BUSPAR) 15 MG tablet Take 15 mg by mouth 2 (two) times daily.     . Calcium Carbonate-Vit D-Min (CALCIUM 600+D PLUS MINERALS) 600-400 MG-UNIT TABS Take 1 tablet by mouth daily.     . Carboxymethylcellul-Glycerin (LUBRICATING EYE DROPS OP) Place 1 drop into both eyes daily as needed (dry eyes).    . Cholecalciferol (VITAMIN D) 50 MCG (2000 UT) tablet Take 2,000 Units by mouth daily.     . citalopram (CELEXA) 40 MG tablet Take 40 mg by mouth daily.    . Cyanocobalamin (RA VITAMIN B-12 TR) 1000 MCG TBCR Take 1,000 mcg by mouth daily.     Marland Kitchen donepezil (ARICEPT) 10 MG tablet Take 10 mg by mouth at bedtime.     . feeding supplement, ENSURE ENLIVE, (ENSURE ENLIVE) LIQD Take 237 mLs by mouth 2 (two) times daily between meals. (Patient taking differently: Take 237 mLs by mouth  daily with breakfast. ) 237 mL 12  . fluticasone (FLONASE) 50 MCG/ACT nasal spray Place 2 sprays into both nostrils daily as needed for allergies.     . Fluticasone-Salmeterol (ADVAIR) 250-50 MCG/DOSE AEPB Inhale 1 puff into the lungs 2 (two) times daily as needed (shortness of breath).     . gabapentin (NEURONTIN) 300 MG capsule Take 300 mg by mouth 4 (four) times daily.    Marland Kitchen levothyroxine (SYNTHROID, LEVOTHROID) 50 MCG tablet Take 50 mcg by mouth daily before breakfast. Take 30 to 60 minutes before breakfast.    . memantine (NAMENDA) 10 MG tablet Take 10 mg by mouth 2 (two) times daily.     . montelukast (SINGULAIR) 10 MG tablet Take 10 mg by mouth at bedtime.     . Multiple Vitamin (MULTI-VITAMIN) tablet Take 1 tablet by mouth daily.     . Multiple Vitamins-Minerals (PRESERVISION AREDS 2) CAPS Take 1 capsule by mouth 2 (two) times a day.    Marland Kitchen NARCAN 4 MG/0.1ML LIQD nasal spray kit Place 1  spray into the nose once.     Marland Kitchen oxybutynin (DITROPAN) 5 MG tablet Take 5 mg by mouth 3 (three) times daily.    Marland Kitchen oxyCODONE-acetaminophen (PERCOCET) 10-325 MG tablet Take 1 tablet by mouth every 6 (six) hours as needed for moderate pain or severe pain.    . pantoprazole (PROTONIX) 20 MG tablet Take 20 mg by mouth 2 (two) times daily.     . pravastatin (PRAVACHOL) 20 MG tablet Take 20 mg by mouth daily.    . vitamin C (ASCORBIC ACID) 500 MG tablet Take 500 mg by mouth daily.    . vitamin E (E-400) 400 UNIT capsule Take 400 Units by mouth daily.     No current facility-administered medications for this visit.    OBJECTIVE: BP (!) 158/88   Pulse (!) 55   Temp 98 F (36.7 C) (Tympanic)   Resp 18   Wt 132 lb 12.8 oz (60.2 kg)   BMI 21.43 kg/m    Body mass index is 21.43 kg/m.    ECOG FS:1 - Symptomatic but completely ambulatory  General: Thin, no acute distress. HEENT: Normocephalic. Neuro: Alert, answering all questions appropriately. Cranial nerves grossly intact. Psych: Normal affect.  LAB  RESULTS:  Appointment on 06/12/2020  Component Date Value Ref Range Status  . WBC 06/12/2020 18.6* 4.0 - 10.5 K/uL Final  . RBC 06/12/2020 3.56* 3.87 - 5.11 MIL/uL Final  . Hemoglobin 06/12/2020 10.8* 12.0 - 15.0 g/dL Final  . HCT 06/12/2020 32.3* 36 - 46 % Final  . MCV 06/12/2020 90.7  80.0 - 100.0 fL Final  . MCH 06/12/2020 30.3  26.0 - 34.0 pg Final  . MCHC 06/12/2020 33.4  30.0 - 36.0 g/dL Final  . RDW 06/12/2020 18.5* 11.5 - 15.5 % Final  . Platelets 06/12/2020 9* 150 - 400 K/uL Final   Comment: REPEATED TO VERIFY SPECIMEN CHECKED FOR CLOTS THIS CRITICAL RESULT HAS VERIFIED AND BEEN CALLED TO DR. [place] ON 09 01 2021 AT 0240, AND HAS BEEN READ BACK. $[email or web address]$ :36AM   . nRBC 06/12/2020 0.1  0.0 - 0.2 % Final  . Neutrophils Relative % 06/12/2020 62  % Final  . Neutro Abs 06/12/2020 11.6* 1.7 - 7.7 K/uL Final  . Lymphocytes Relative 06/12/2020 27  % Final  . Lymphs Abs 06/12/2020 5.0* 0.7 - 4.0 K/uL Final  . Monocytes Relative 06/12/2020 9  % Final  . Monocytes Absolute 06/12/2020 1.7* 0 - 1 K/uL Final  . Eosinophils Relative 06/12/2020 1  % Final  . Eosinophils Absolute 06/12/2020 0.1  0 - 0 K/uL Final  . Basophils Relative 06/12/2020 0  % Final  . Basophils Absolute 06/12/2020 0.1  0 - 0 K/uL Final  . Smear Review 06/12/2020 PLATELET COUNT CONFIRMED BY SMEAR   Final   Comment: Reviewed GIANT BODY PLATELETS NOTED ON SMEAR.   . Immature Granulocytes 06/12/2020 1  % Final  . Abs Immature Granulocytes 06/12/2020 0.11* 0.00 - 0.07 K/uL Final   Performed at [facility], [address] [place], [place] [address]    STUDIES: No results found.  ASSESSMENT:  Chronic refractory ITP  PLAN:   1. Chronic refractory ITP: Patient had a poor response to Prednisone, WinRho, and Rituxan. She is status post splenectomy in 2007.  She received IVIG and Nplate she received while at [facility] with significant improvement of her platelets.  If Nplate no longer was effective, could  retry IVIG at a future date.  Patient is currently receiving the  max dose of Nplate at 10 mcg/kg.  Can also consider Promacta 12.5mg  or Tavalisse 100 mg BID if needed.  Patient's platelet count is 9 today, therefore will proceed with treatment as planned.  Return to clinic in 4 weeks for repeat laboratory work, further evaluation, and continuation of Nplate.   2. Weakness and fatigue: Chronic and unchanged. 3.  Depression/grief: Improved.  Secondary to the recent death of her son.  Monitor.  Appreciate palliative care input.   4.  Poor appetite/weight loss: Resolved.  Patient is now gaining weight. 5.  Dementia: Chronic and unchanged.  Patient is now at [employer]. 6.  Leukocytosis: White blood cell count has increased to 18.6, but patient's husband reports she just recently finished a prednisone taper.  Monitor.  I provided 30 minutes of face-to-face video visit time during this encounter which included chart review, counseling, and coordination of care as documented above.   Patient expressed understanding and was in agreement with this plan. She also understands that She can call clinic at any time with any questions, concerns, or complaints.    Lloyd Huger, MD   06/13/2020 8:39 AM

## 2020-06-11 NOTE — Progress Notes (Signed)
Patient denies any concerns today.  

## 2020-06-12 ENCOUNTER — Inpatient Hospital Stay: Payer: Medicare HMO | Attending: Oncology

## 2020-06-12 ENCOUNTER — Inpatient Hospital Stay: Payer: Medicare HMO | Admitting: Hospice and Palliative Medicine

## 2020-06-12 ENCOUNTER — Inpatient Hospital Stay: Payer: Medicare HMO

## 2020-06-12 ENCOUNTER — Inpatient Hospital Stay: Payer: Medicare HMO | Admitting: Oncology

## 2020-06-12 ENCOUNTER — Other Ambulatory Visit: Payer: Self-pay

## 2020-06-12 VITALS — BP 158/88 | HR 55 | Temp 98.0°F | Resp 18 | Wt 132.8 lb

## 2020-06-12 DIAGNOSIS — D693 Immune thrombocytopenic purpura: Secondary | ICD-10-CM

## 2020-06-12 LAB — CBC WITH DIFFERENTIAL/PLATELET
Abs Immature Granulocytes: 0.11 10*3/uL — ABNORMAL HIGH (ref 0.00–0.07)
Basophils Absolute: 0.1 10*3/uL (ref 0.0–0.1)
Basophils Relative: 0 %
Eosinophils Absolute: 0.1 10*3/uL (ref 0.0–0.5)
Eosinophils Relative: 1 %
HCT: 32.3 % — ABNORMAL LOW (ref 36.0–46.0)
Hemoglobin: 10.8 g/dL — ABNORMAL LOW (ref 12.0–15.0)
Immature Granulocytes: 1 %
Lymphocytes Relative: 27 %
Lymphs Abs: 5 10*3/uL — ABNORMAL HIGH (ref 0.7–4.0)
MCH: 30.3 pg (ref 26.0–34.0)
MCHC: 33.4 g/dL (ref 30.0–36.0)
MCV: 90.7 fL (ref 80.0–100.0)
Monocytes Absolute: 1.7 10*3/uL — ABNORMAL HIGH (ref 0.1–1.0)
Monocytes Relative: 9 %
Neutro Abs: 11.6 10*3/uL — ABNORMAL HIGH (ref 1.7–7.7)
Neutrophils Relative %: 62 %
Platelets: 9 10*3/uL — CL (ref 150–400)
RBC: 3.56 MIL/uL — ABNORMAL LOW (ref 3.87–5.11)
RDW: 18.5 % — ABNORMAL HIGH (ref 11.5–15.5)
WBC: 18.6 10*3/uL — ABNORMAL HIGH (ref 4.0–10.5)
nRBC: 0.1 % (ref 0.0–0.2)

## 2020-06-12 MED ORDER — ROMIPLOSTIM INJECTION 500 MCG
10.0000 ug/kg | Freq: Once | SUBCUTANEOUS | Status: AC
  Administered 2020-06-12: 600 ug via SUBCUTANEOUS
  Filled 2020-06-12: qty 1.2

## 2020-06-13 ENCOUNTER — Other Ambulatory Visit: Payer: Self-pay | Admitting: Surgery

## 2020-06-19 ENCOUNTER — Encounter
Admission: RE | Admit: 2020-06-19 | Discharge: 2020-06-19 | Disposition: A | Discharge status: Home / Self Care | Payer: Medicare HMO | Source: Ambulatory Visit | Attending: Surgery | Admitting: Surgery

## 2020-06-19 ENCOUNTER — Other Ambulatory Visit: Payer: Self-pay

## 2020-06-19 DIAGNOSIS — Z01818 Encounter for other preprocedural examination: Secondary | ICD-10-CM | POA: Insufficient documentation

## 2020-06-19 HISTORY — DX: Left bundle-branch block, unspecified: I44.7

## 2020-06-19 HISTORY — DX: Hypothyroidism, unspecified: E03.9

## 2020-06-19 LAB — TYPE AND SCREEN
ABO/RH(D): B POS
Antibody Screen: NEGATIVE

## 2020-06-19 LAB — CBC WITH DIFFERENTIAL/PLATELET
Abs Immature Granulocytes: 0.18 10*3/uL — ABNORMAL HIGH (ref 0.00–0.07)
Basophils Absolute: 0.1 10*3/uL (ref 0.0–0.1)
Basophils Relative: 0 %
Eosinophils Absolute: 0.2 10*3/uL (ref 0.0–0.5)
Eosinophils Relative: 1 %
HCT: 38.7 % (ref 36.0–46.0)
Hemoglobin: 12.5 g/dL (ref 12.0–15.0)
Immature Granulocytes: 1 %
Lymphocytes Relative: 15 %
Lymphs Abs: 2.4 10*3/uL (ref 0.7–4.0)
MCH: 29.7 pg (ref 26.0–34.0)
MCHC: 32.3 g/dL (ref 30.0–36.0)
MCV: 91.9 fL (ref 80.0–100.0)
Monocytes Absolute: 1 10*3/uL (ref 0.1–1.0)
Monocytes Relative: 6 %
Neutro Abs: 12.4 10*3/uL — ABNORMAL HIGH (ref 1.7–7.7)
Neutrophils Relative %: 77 %
Platelets: UNDETERMINED 10*3/uL (ref 150–400)
RBC: 4.21 MIL/uL (ref 3.87–5.11)
RDW: 18.3 % — ABNORMAL HIGH (ref 11.5–15.5)
WBC: 16.3 10*3/uL — ABNORMAL HIGH (ref 4.0–10.5)
nRBC: 0.1 % (ref 0.0–0.2)

## 2020-06-19 LAB — URINALYSIS, ROUTINE W REFLEX MICROSCOPIC
Bilirubin Urine: NEGATIVE
Glucose, UA: NEGATIVE mg/dL
Hgb urine dipstick: NEGATIVE
Ketones, ur: NEGATIVE mg/dL
Leukocytes,Ua: NEGATIVE
Nitrite: NEGATIVE
Protein, ur: NEGATIVE mg/dL
Specific Gravity, Urine: 1.017 (ref 1.005–1.030)
pH: 8 (ref 5.0–8.0)

## 2020-06-19 LAB — SURGICAL PCR SCREEN
MRSA, PCR: NEGATIVE
Staphylococcus aureus: NEGATIVE

## 2020-06-19 LAB — COMPREHENSIVE METABOLIC PANEL
ALT: 21 U/L (ref 0–44)
AST: 22 U/L (ref 15–41)
Albumin: 4.2 g/dL (ref 3.5–5.0)
Alkaline Phosphatase: 81 U/L (ref 38–126)
Anion gap: 10 (ref 5–15)
BUN: 16 mg/dL (ref 8–23)
CO2: 29 mmol/L (ref 22–32)
Calcium: 9.3 mg/dL (ref 8.9–10.3)
Chloride: 100 mmol/L (ref 98–111)
Creatinine, Ser: 1.13 mg/dL — ABNORMAL HIGH (ref 0.44–1.00)
GFR calc Af Amer: 52 mL/min — ABNORMAL LOW (ref >60)
GFR calc non Af Amer: 45 mL/min — ABNORMAL LOW (ref >60)
Glucose, Bld: 103 mg/dL — ABNORMAL HIGH (ref 70–99)
Potassium: 3.9 mmol/L (ref 3.5–5.1)
Sodium: 139 mmol/L (ref 135–145)
Total Bilirubin: 0.5 mg/dL (ref 0.3–1.2)
Total Protein: 6.8 g/dL (ref 6.5–8.1)

## 2020-06-19 NOTE — Progress Notes (Addendum)
  [facility] Perioperative Services: Pre-Admission/Anesthesia Testing  Abnormal Lab Notification    Date: 06/19/20  Name: Ariana [name] MRN:   [id number]  Re: Abnormal labs noted during PAT appointment   Provider(s) Notified: Poggi, Marshall Cork, MD Notification mode: Routed and/or faxed via [place] LAB VALUE(S): Lab Results  Component Value Date   WBC 16.3 (H) 06/19/2020   PLT PLATELET CLUMPS NOTED ON SMEAR, UNABLE TO ESTIMATE 06/19/2020    Notes: Patient with ITP diagnosis. Last platelet count on 06/12/2020 was 9 K/uL. She is day 7 s/p romiplostim injection, however due to platelet clumping, we are unable to determine post-treatment count recovery. I have reached out to hematology for clearance prior to surgery. Will have SDS recheck CBC on the am of surgery to recheck platelet count. This is a Community education officer; no formal response is required.  ADDENDUM: 06/19/2020 at 1353 - spoke with primary RN in cancer center for this patient regarding clearance. [place], RN plans to discuss with oncologist Grayland Ormond, MD). Of note, RN makes mention of the fact that patient's platelet counts have been being obtained on citrated sample due to clumping. CBC order discontinued. Order placed for day of surgery to have platelet counter performed on citrated (blue top) tube.  Honor Loh, MSN, APRN, FNP-C, CEN [facility]  Peri-operative Services Nurse Practitioner Phone: [phone] 06/19/20 1:35 PM

## 2020-06-19 NOTE — Patient Instructions (Addendum)
Your procedure is scheduled on: 06-25-20 TUESDAY Report to Same Day Surgery 2nd floor medical mall [facility] Entrance-take elevator on left to 2nd floor.  Check in with surgery information desk.) To find out your arrival time please call [phone] between 1PM - 3PM on 06-24-20 MONDAY  Remember: Instructions that are not followed completely may result in serious medical risk, up to and including death, or upon the discretion of your surgeon and anesthesiologist your surgery may need to be rescheduled.    _x___ 1. Do not eat food after midnight the night before your procedure. NO GUM OR CANDY AFTER MIDNIGHT. You may drink clear liquids up to 2 hours before you are scheduled to arrive at the hospital for your procedure.  Do not drink clear liquids within 2 hours of your scheduled arrival to the hospital.  Clear liquids include  --Water or Apple juice without pulp  --Gatorade  --Black Coffee or Clear Tea (No milk, no creamers, do not add anything to the coffee or Tea Type 1 and type 2 diabetics should only drink water.  _X___Ensure clear carbohydrate drink-FINISH DRINK 2 HOURS PRIOR TO ARRIVAL TIME TO HOSPITAL THE DAY OF YOUR SURGERY     __x__ 2. No Alcohol for 24 hours before or after surgery.   __x__3. No Smoking or e-cigarettes for 24 prior to surgery.  Do not use any chewable tobacco products for at least 6 hour prior to surgery   ____  4. Bring all medications with you on the day of surgery if instructed.    __x__ 5. Notify your doctor if there is any change in your medical condition     (cold, fever, infections).    x___6. On the morning of surgery brush your teeth with toothpaste and water.  You may rinse your mouth with mouth wash if you wish.  Do not swallow any toothpaste or mouthwash.   Do not wear jewelry, make-up, hairpins, clips or nail polish.  Do not wear lotions, powders, or perfumes.   Do not shave 48 hours prior to surgery. Men may shave face and neck.  Do not  bring valuables to the hospital.    [facility] is not responsible for any belongings or valuables.               Contacts, dentures or bridgework may not be worn into surgery.  Leave your suitcase in the car. After surgery it may be brought to your room.  For patients admitted to the hospital, discharge time is determined by your treatment team.  _  Patients discharged the day of surgery will not be allowed to drive home.  You will need someone to drive you home and stay with you the night of your procedure.    Please read over the following fact sheets that you were given:   [facility] Preparing for Surgery and MRSA Information/INCENTIVE SPIROMETER INSTRUCTIONS-THE INCENTIVE SPIROMETER DEVICE WILL BE GIVEN THE DAY OF SURGERY  _x___ TAKE THE FOLLOWING MEDICATION THE MORNING OF SURGERY WITH A SMALL SIP OF WATER. These include:  1. SYNTHROID (LEVOTHYROXINE)  2. PROTONIX (PANTOPRAZOLE)  3. BUSPAR (BUSPIRONE)  4. GABAPENTIN (NEURONTIN)  5. NAMENDA (MEMANTINE)  6. DITROPAN (OXYBUTYNIN)  7. WELLBUTRIN (BUPROPRION)  8. CELEXA (CITALOPRAM)  9. YOU MAY TAKE XANAX (ALPRAZOLAM) FOR ANXIETY DAY OF SURGERY IF NEEDED   10. YOU MAY TAKE PERCOCET FOR PAIN IF NEEDED DAY OF SURGERY ____Fleets enema or Magnesium Citrate as directed.   _x___ Use CHG Soap as directed  on instruction sheet   _X___ Use inhalers on the day of surgery and bring to hospital day of surgery-USE YOUR ALBUTEROL INHALER THE DAY OF SURGERY AND BRING ALBUTEROL [place]  ____ Stop Metformin and Janumet 2 days prior to surgery.    ____ Take 1/2 of usual insulin dose the night before surgery and none on the morning surgery.   ____ Follow recommendations from Cardiologist, Pulmonologist or PCP regarding stopping Aspirin, Coumadin, Plavix ,Eliquis, Effient, or Pradaxa, and Pletal.  X____Stop Anti-inflammatories such as Advil, Aleve, Ibuprofen, Motrin, Naproxen, Naprosyn, Goodies powders or aspirin products NOW-OK to take  Tylenol    _x___ Stop supplements until after surgery-STOP PRESERVISION, VITAMIN C AND E NOW-YOU MAY RESUME AFTER YOUR SURGERY   ____ Bring C-Pap to the hospital.

## 2020-06-20 NOTE — Progress Notes (Signed)
[facility] Perioperative Services  Pre-Admission/Anesthesia Testing Clinical Review  Date: 06/21/20  Patient Demographics:  Name: Ariana [name] DOB:   1932/11/04 MRN:   [id number]  Planned Surgical Procedure(s):    Case: [id number] Date/Time: 06/25/20 0715   Procedure: REVERSE SHOULDER ARTHROPLASTY (Left Shoulder)   Anesthesia type: Choice   Pre-op diagnosis:      Primary osteoarthritis of left shoulder M19.012     Traumatic tear of left rotator cuff, unspecified tear extent, initial encounter S46.012A   Location: [place] [facility] [place] [facility]   Surgeons: Corky Mull, MD     NOTE: Available PAT nursing documentation and vital signs have been reviewed. Clinical nursing staff has updated patient's PMH/PSHx, current medication list, and drug allergies/intolerances to ensure comprehensive history available to assist in medical decision making as it pertains to the aforementioned surgical procedure and anticipated anesthetic course.   Clinical Discussion:  Ariana [name] is a 86 y.o. female who is submitted for pre-surgical anesthesia review and clearance prior to her undergoing the above procedure. Patient has never been a smoker. Pertinent PMH includes: angina, A. fib, LBBB, HTN, HLD, CVA, GERD with known hiatal hernia (on daily PPI), hypothyroidism, anemia, ITP, DOE, asthma, OA, chronic lumbar DDD, Alzheimer's disease, adult FTT, chronic pain syndrome, anxiety (on BZO), depression  Patient with known old LBBB. She is not followed routinely by cardiology.  PCP Doy Hutching, MD) managing patient, patient last seen on 05/28/2020.  No recent complaints or angina or anginal equivalent symptoms.  She complains of a 1 month history of productive cough (yellow sputum).  SARS-CoV-2 negative x 2 weeks ago.  Blood pressure mildly elevated at 156/84.  Patient reported to "look well" in clinic with NAD noted.  Exam reveals scattered rhonchi for  which patient was prescribed a course of azithromycin, prednisone, and benzonatate for presumed exacerbation of patient's known mild intermittent asthma. TTE in 06/2019 revealed normal LV systolic function; LVEF 25-42% (see full results below).  Patient scheduled to follow-up on a as needed basis if not improving.  Patient scheduled to undergo an elective orthopedic procedure on 06/25/2020.  Given patient's, past medical history, and the fact that she is currently not seen cardiology, the decision was made by the PAT team to secure presurgical clearance from internal medicine. Outlined recent labs and plans for day of surgery recheck. MD indicated that he agreed with the plan that I had outlined (see clearance form). Per Dr. Doy Hutching, "patient is cleared to proceed with a MODERATE risk stratification".   Patient with a past medical history significant for chronic ITP.  Patient is followed in the [facility] cancer center by Dr. Delight Hoh; last seen on 06/12/2020; notes reviewed.  During her visit, patient complained of chronic fatigue, generalized weakness, and easy bruising.  Husband reported the patient continues to suffer issues with her memory.  Labs revealed a leukocytosis with a white count of 18.6.  Patient with mild normocytic anemia; hemoglobin 10.8 with hematocrit 32.3.  Patient had a critical platelet count of 9000.  Of note, patient status post splenectomy (2007). Patient has had a poor response to prednisone, WinRho, and rituximab as treatment for her ITP. She has also received romiplostim + IVIG in the past at [facility]. She is currently on monotherapy using the maximum dose of romiplostim.  She last received an injection at her clinic visit on 06/12/2020.  Patient scheduled to RTC in 4 weeks for repeat lab work and continuation  of treatment for her chronic ITP.  Given patient's significant thrombocytopenia secondary to ITP, input from her primary hematology/oncology provider was  sought by the PAT team. Per Dr. Grayland Ormond, patient typically responds well to the romiplostim injections and her platelet counts will appropriately increased to > 200 K/uL. Previously discussed my plans with patient's primary RN, who in turn, relayed the plan to patient's hematologist. Dr. Grayland Ormond agrees that platelet count should be checked on citrated sample 1-2 hours prior to surgery. Hematology clearance to proceed with surgery issued on 06/20/2020 with a MODERATE risk stratification.   She denies previous perioperative complications with anesthesia. She underwent a general anesthetic course here (ASA II) in 04/2019 with no documented complications.   Vitals with BMI 06/19/2020 06/12/2020 05/15/2020  Height _0  - -  Weight 127 lbs 132 lbs 13 oz 129 lbs 14 oz  BMI 03.49 - -  Systolic 179 150 569  Diastolic 62 88 79  Pulse 64 55 62    Providers/Specialists:   NOTE: Primary physician provider listed below. Patient may have been seen by APP or partner within same practice.   PROVIDER ROLE LAST OV  Poggi, Marshall Cork, MD Orthopedics (Surgeon) 06/19/2020  Idelle Crouch, MD Primary Care Provider 05/28/2020  Delight Hoh, MD Hematology/oncology 06/12/2020   Allergies:  Aspirin, Diazepam, Other, Tetanus toxoids, and Morphine  Current Home Medications:   No current facility-administered medications for this encounter.   Marland Kitchen acetaminophen (TYLENOL) 325 MG tablet  . albuterol (PROVENTIL HFA;VENTOLIN HFA) 108 (90 BASE) MCG/ACT inhaler  . ALPRAZolam (XANAX) 0.5 MG tablet  . buPROPion (WELLBUTRIN XL) 150 MG 24 hr tablet  . busPIRone (BUSPAR) 15 MG tablet  . Calcium Carbonate-Vit D-Min (CALCIUM 600+D PLUS MINERALS) 600-400 MG-UNIT TABS  . Carboxymethylcellul-Glycerin (LUBRICATING EYE DROPS OP)  . Cholecalciferol (VITAMIN D) 50 MCG (2000 UT) tablet  . citalopram (CELEXA) 40 MG tablet  . Cyanocobalamin (RA VITAMIN B-12 TR) 1000 MCG TBCR  . donepezil (ARICEPT) 10 MG tablet  . feeding  supplement, ENSURE ENLIVE, (ENSURE ENLIVE) LIQD  . fluticasone (FLONASE) 50 MCG/ACT nasal spray  . Fluticasone-Salmeterol (ADVAIR) 250-50 MCG/DOSE AEPB  . gabapentin (NEURONTIN) 300 MG capsule  . levothyroxine (SYNTHROID, LEVOTHROID) 50 MCG tablet  . memantine (NAMENDA) 10 MG tablet  . montelukast (SINGULAIR) 10 MG tablet  . Multiple Vitamin (MULTI-VITAMIN) tablet  . Multiple Vitamins-Minerals (PRESERVISION AREDS 2) CAPS  . NARCAN 4 MG/0.1ML LIQD nasal spray kit  . oxybutynin (DITROPAN) 5 MG tablet  . oxyCODONE-acetaminophen (PERCOCET) 10-325 MG tablet  . pantoprazole (PROTONIX) 20 MG tablet  . pravastatin (PRAVACHOL) 20 MG tablet  . vitamin C (ASCORBIC ACID) 500 MG tablet  . vitamin E (E-400) 400 UNIT capsule   History:   Past Medical History:  Diagnosis Date  . Alzheimer disease ([place])   . Anemia   . Anxiety   . Asthma    WELL CONTROLLED  . Chronic back pain   . Depression   . Depression   . GERD (gastroesophageal reflux disease)   . Hiatal hernia   . Hypercholesteremia   . Hypothyroidism   . ITP (idiopathic thrombocytopenic purpura)    FOLLOWED BR DR Grayland Ormond  . LBBB (left bundle branch block)   . Osteoarthritis   . Osteoporosis   . Restless legs    Past Surgical History:  Procedure Laterality Date  . CARPAL TUNNEL RELEASE    . ESOPHAGOGASTRODUODENOSCOPY (EGD) WITH PROPOFOL N/A 09/10/2015   Procedure: ESOPHAGOGASTRODUODENOSCOPY (EGD) WITH PROPOFOL;  Surgeon: Lollie Sails, MD;  Location: [facility] END[facility]COPY;  Service: Endoscopy;  Laterality: N/A;  . IRRIGATION AND DEBRIDEMENT HEMATOMA Right 07/13/2018   Procedure: IRRIGATION AND DEBRIDEMENT HEMATOMA-RIGHT SHIN;  Surgeon: Herbert Pun, MD;  Location: [facility]S;  Service: General;  Laterality: Right;  . KYPHOPLASTY N/A 05/02/2019   Procedure: L1 KYPHOPLASTY;  Surgeon: Hessie Knows, MD;  Location: [facility] ORS;  Service: Orthopedics;  Laterality: N/A;  . LUMBAR [place]    . SPLENECTOMY, PARTIAL    . TOTAL  ABDOMINAL HYSTERECTOMY     Family History  Problem Relation Age of Onset  . Stroke Mother    Social History   Tobacco Use  . Smoking status: Never Smoker  . Smokeless tobacco: Never Used  Vaping Use  . Vaping Use: Never used  Substance Use Topics  . Alcohol use: No    Alcohol/week: 0.0 standard drinks  . Drug use: No    Pertinent Clinical Results:  LABS:   Hospital Outpatient Visit on 06/19/2020  Component Date Value Ref Range Status  . MRSA, PCR 06/19/2020 NEGATIVE  NEGATIVE Final  . Staphylococcus aureus 06/19/2020 NEGATIVE  NEGATIVE Final   Comment: (NOTE) The Xpert SA Assay (FDA approved for NASAL specimens in patients 37 years of age and older), is one component of a comprehensive surveillance program. It is not intended to diagnose infection nor to guide or monitor treatment. Performed at [facility], [address], [place], [place] [address]   . WBC 06/19/2020 16.3* 4.0 - 10.5 K/uL Final   WHITE COUNT CONFIRMED ON SMEAR  . RBC 06/19/2020 4.21  3.87 - 5.11 MIL/uL Final  . Hemoglobin 06/19/2020 12.5  12.0 - 15.0 g/dL Final  . HCT 06/19/2020 38.7  36 - 46 % Final  . MCV 06/19/2020 91.9  80.0 - 100.0 fL Final  . MCH 06/19/2020 29.7  26.0 - 34.0 pg Final  . MCHC 06/19/2020 32.3  30.0 - 36.0 g/dL Final  . RDW 06/19/2020 18.3* 11.5 - 15.5 % Final  . Platelets 06/19/2020 PLATELET CLUMPS NOTED ON SMEAR, UNABLE TO ESTIMATE  150 - 400 K/uL Final   Comment: Immature Platelet Fraction may be clinically indicated, consider ordering this additional test [id number]   . nRBC 06/19/2020 0.1  0.0 - 0.2 % Final  . Neutrophils Relative % 06/19/2020 77  % Final  . Neutro Abs 06/19/2020 12.4* 1.7 - 7.7 K/uL Final  . Lymphocytes Relative 06/19/2020 15  % Final  . Lymphs Abs 06/19/2020 2.4  0.7 - 4.0 K/uL Final  . Monocytes Relative 06/19/2020 6  % Final  . Monocytes Absolute 06/19/2020 1.0  0 - 1 K/uL Final  . Eosinophils Relative 06/19/2020 1  % Final  . Eosinophils  Absolute 06/19/2020 0.2  0 - 0 K/uL Final  . Basophils Relative 06/19/2020 0  % Final  . Basophils Absolute 06/19/2020 0.1  0 - 0 K/uL Final  . RBC Morphology 06/19/2020 MORPHOLOGY UNREMARKABLE   Final  . Immature Granulocytes 06/19/2020 1  % Final  . Abs Immature Granulocytes 06/19/2020 0.18* 0.00 - 0.07 K/uL Final  . Reactive, Benign Lymphocytes 06/19/2020 PRESENT   Final  . Giant PLTs 06/19/2020 PRESENT   Final   Performed at [facility], [address], [place], [place] [address]  . Sodium 06/19/2020 139  135 - 145 mmol/L Final  . Potassium 06/19/2020 3.9  3.5 - 5.1 mmol/L Final  . Chloride 06/19/2020 100  98 - 111 mmol/L Final  . CO2 06/19/2020 29  22 - 32 mmol/L Final  .  Glucose, Bld 06/19/2020 103* 70 - 99 mg/dL Final   Glucose reference range applies only to samples taken after fasting for at least 8 hours.  . BUN 06/19/2020 16  8 - 23 mg/dL Final  . Creatinine, Ser 06/19/2020 1.13* 0.44 - 1.00 mg/dL Final  . Calcium 06/19/2020 9.3  8.9 - 10.3 mg/dL Final  . Total Protein 06/19/2020 6.8  6.5 - 8.1 g/dL Final  . Albumin 06/19/2020 4.2  3.5 - 5.0 g/dL Final  . AST 06/19/2020 22  15 - 41 U/L Final  . ALT 06/19/2020 21  0 - 44 U/L Final  . Alkaline Phosphatase 06/19/2020 81  38 - 126 U/L Final  . Total Bilirubin 06/19/2020 0.5  0.3 - 1.2 mg/dL Final  . GFR calc non Af Amer 06/19/2020 45* >60 mL/min Final  . GFR calc Af Amer 06/19/2020 52* >60 mL/min Final  . Anion gap 06/19/2020 10  5 - 15 Final   Performed at [facility], [address], [place], [place] [address]  . ABO/RH(D) 06/19/2020 B POS   Final  . Antibody Screen 06/19/2020 NEG   Final  . Sample Expiration 06/19/2020 07/03/2020,2359   Final  . Extend sample reason 06/19/2020    Final                   Value:NO TRANSFUSIONS OR PREGNANCY IN THE PAST 3 MONTHS Performed at [facility], [address] [place], [place] [address]   . Color, Urine 06/19/2020 YELLOW* YELLOW Final  . APPearance  06/19/2020 HAZY* CLEAR Final  . Specific Gravity, Urine 06/19/2020 1.017  1.005 - 1.030 Final  . pH 06/19/2020 8.0  5.0 - 8.0 Final  . Glucose, UA 06/19/2020 NEGATIVE  NEGATIVE mg/dL Final  . Hgb urine dipstick 06/19/2020 NEGATIVE  NEGATIVE Final  . Bilirubin Urine 06/19/2020 NEGATIVE  NEGATIVE Final  . Ketones, ur 06/19/2020 NEGATIVE  NEGATIVE mg/dL Final  . Protein, ur 06/19/2020 NEGATIVE  NEGATIVE mg/dL Final  . Nitrite 06/19/2020 NEGATIVE  NEGATIVE Final  . Chalmers Guest 06/19/2020 NEGATIVE  NEGATIVE Final   Performed at [facility], [address] [place],  [address]    ECG: Date: 06/19/2020 Time ECG obtained: 11:48 AM Rate: 64 bpm Rhythm: normal sinus; LBBB Axis (leads I and aVF): Left axis deviation Intervals: PR 160 ms. QRS 148 ms. QTc 470 ms. ST segment and T wave changes: No evidence of acute ST segment elevation or depression Comparison: Similar to previous tracing obtained on 08/12/2019   IMAGING / PROCEDURES: MR SHOULDER LEFT WO CONTRAST done on 05/05/2020 1. Severe osteoarthritis of the left glenohumeral joint. 2. Moderate tendinosis of the supraspinatus tendon with a small partial articular surface tear. 3. Mild tendinosis of the infraspinatus tendon. 4. Mild tendinosis of the subscapularis tendon with a small partial-thickness tear. 5. Moderate tendinosis of the intra-articular portion of the long head of the biceps tendon.  ECHOCARDIOGRAM done on 07/11/2019 1. Left ventricular ejection fraction, by visual estimation, is 55 to 60%. The left ventricle has normal function. Normal left ventricular size. There is mildly increased left ventricular hypertrophy.  2. Left ventricular diastolic Doppler parameters are consistent with pseudonormalization pattern of LV diastolic filling.  3. Global right ventricle has normal systolic function.The right ventricular size is mildly enlarged. No increase in right ventricular wall thickness.  4. Left atrial size  was mildly dilated.  5. Right atrial size was normal.  6. The pericardial effusion is posterior to the left ventricle. 7. Trivial pericardial  effusion is present.  8. Mild aortic valve annular calcification.  9. Moderate mitral annular calcification.  10. The mitral valve is grossly normal. Moderate mitral valve regurgitation.  11. The tricuspid valve is grossly normal. Tricuspid valve regurgitation is mild.  12. The aortic valve is tricuspid Aortic valve regurgitation was not visualized by color flow Doppler. Mild aortic valve sclerosis without stenosis.  13. The pulmonic valve was normal in structure. Pulmonic valve regurgitation is trivial by color flow Doppler.  14. Normal pulmonary artery systolic pressure.  15. The inferior vena cava is normal in size with <50% respiratory variability, suggesting right atrial pressure of 8 mmHg.  16. The interatrial septum was not well visualized.   Impression and Plan:  Ariana [name] has been referred for pre-anesthesia review and clearance prior to her undergoing the planned anesthetic and procedural courses. Available labs, pertinent testing, and imaging results were personally reviewed by me. This patient has been appropriately cleared by internal medicine and hematology/oncology, with both specialities advising of a MODERATE risk stratification.   Based on clinical review performed today (06/21/20), barring any significant acute changes in the patient's overall condition, it is anticipated that she will be able to proceed with the planned surgical intervention. Any acute changes in clinical condition may necessitate her procedure being postponed and/or cancelled. Pre-surgical instructions were reviewed with the patient during her PAT appointment and questions were fielded by PAT clinical staff.  Honor Loh, MSN, APRN, FNP-C, CEN [facility]  Peri-operative Services Nurse Practitioner Phone: [phone] 06/21/20  10:19 AM  NOTE: This note has been prepared using Dragon dictation software. Despite my best ability to proofread, there is always the potential that unintentional transcriptional errors may still occur from this process.

## 2020-06-24 ENCOUNTER — Other Ambulatory Visit: Payer: Self-pay

## 2020-06-24 ENCOUNTER — Other Ambulatory Visit
Admission: RE | Admit: 2020-06-24 | Discharge: 2020-06-24 | Disposition: A | Discharge status: Home / Self Care | Payer: Medicare HMO | Source: Ambulatory Visit | Attending: Surgery | Admitting: Surgery

## 2020-06-24 DIAGNOSIS — Z01812 Encounter for preprocedural laboratory examination: Secondary | ICD-10-CM | POA: Insufficient documentation

## 2020-06-24 DIAGNOSIS — Z20822 Contact with and (suspected) exposure to covid-19: Secondary | ICD-10-CM | POA: Insufficient documentation

## 2020-06-24 DIAGNOSIS — M12812 Other specific arthropathies, not elsewhere classified, left shoulder: Secondary | ICD-10-CM | POA: Insufficient documentation

## 2020-06-25 ENCOUNTER — Ambulatory Visit: Payer: Medicare HMO | Admitting: Urgent Care

## 2020-06-25 ENCOUNTER — Encounter
Admission: RE | Disposition: A | Discharge status: Home / Self Care | Payer: Self-pay | Source: Home / Self Care | Attending: Surgery

## 2020-06-25 ENCOUNTER — Ambulatory Visit: Payer: Medicare HMO

## 2020-06-25 ENCOUNTER — Ambulatory Visit
Admission: RE | Admit: 2020-06-25 | Discharge: 2020-06-25 | Disposition: A | Discharge status: Home / Self Care | Payer: Medicare HMO | Attending: Surgery | Admitting: Surgery

## 2020-06-25 DIAGNOSIS — D649 Anemia, unspecified: Secondary | ICD-10-CM | POA: Diagnosis not present

## 2020-06-25 DIAGNOSIS — F419 Anxiety disorder, unspecified: Secondary | ICD-10-CM | POA: Diagnosis not present

## 2020-06-25 DIAGNOSIS — Z86711 Personal history of pulmonary embolism: Secondary | ICD-10-CM | POA: Insufficient documentation

## 2020-06-25 DIAGNOSIS — Z96612 Presence of left artificial shoulder joint: Secondary | ICD-10-CM | POA: Diagnosis not present

## 2020-06-25 DIAGNOSIS — F0280 Dementia in other diseases classified elsewhere without behavioral disturbance: Secondary | ICD-10-CM | POA: Insufficient documentation

## 2020-06-25 DIAGNOSIS — Z8673 Personal history of transient ischemic attack (TIA), and cerebral infarction without residual deficits: Secondary | ICD-10-CM | POA: Diagnosis not present

## 2020-06-25 DIAGNOSIS — K219 Gastro-esophageal reflux disease without esophagitis: Secondary | ICD-10-CM | POA: Insufficient documentation

## 2020-06-25 DIAGNOSIS — J4520 Mild intermittent asthma, uncomplicated: Secondary | ICD-10-CM | POA: Insufficient documentation

## 2020-06-25 DIAGNOSIS — M75112 Incomplete rotator cuff tear or rupture of left shoulder, not specified as traumatic: Secondary | ICD-10-CM | POA: Diagnosis not present

## 2020-06-25 DIAGNOSIS — E039 Hypothyroidism, unspecified: Secondary | ICD-10-CM | POA: Diagnosis not present

## 2020-06-25 DIAGNOSIS — W1830XA Fall on same level, unspecified, initial encounter: Secondary | ICD-10-CM | POA: Insufficient documentation

## 2020-06-25 DIAGNOSIS — G8918 Other acute postprocedural pain: Secondary | ICD-10-CM | POA: Diagnosis not present

## 2020-06-25 DIAGNOSIS — G309 Alzheimer's disease, unspecified: Secondary | ICD-10-CM | POA: Insufficient documentation

## 2020-06-25 DIAGNOSIS — G2581 Restless legs syndrome: Secondary | ICD-10-CM | POA: Insufficient documentation

## 2020-06-25 DIAGNOSIS — S46012A Strain of muscle(s) and tendon(s) of the rotator cuff of left shoulder, initial encounter: Secondary | ICD-10-CM | POA: Insufficient documentation

## 2020-06-25 DIAGNOSIS — M7542 Impingement syndrome of left shoulder: Secondary | ICD-10-CM | POA: Diagnosis not present

## 2020-06-25 DIAGNOSIS — Z79899 Other long term (current) drug therapy: Secondary | ICD-10-CM | POA: Insufficient documentation

## 2020-06-25 DIAGNOSIS — Z419 Encounter for procedure for purposes other than remedying health state, unspecified: Secondary | ICD-10-CM

## 2020-06-25 DIAGNOSIS — F329 Major depressive disorder, single episode, unspecified: Secondary | ICD-10-CM | POA: Diagnosis not present

## 2020-06-25 DIAGNOSIS — G4733 Obstructive sleep apnea (adult) (pediatric): Secondary | ICD-10-CM | POA: Diagnosis not present

## 2020-06-25 DIAGNOSIS — M67814 Other specified disorders of tendon, left shoulder: Secondary | ICD-10-CM | POA: Insufficient documentation

## 2020-06-25 DIAGNOSIS — Z7951 Long term (current) use of inhaled steroids: Secondary | ICD-10-CM | POA: Diagnosis not present

## 2020-06-25 DIAGNOSIS — G894 Chronic pain syndrome: Secondary | ICD-10-CM | POA: Insufficient documentation

## 2020-06-25 DIAGNOSIS — Z79890 Hormone replacement therapy (postmenopausal): Secondary | ICD-10-CM | POA: Diagnosis not present

## 2020-06-25 DIAGNOSIS — E7800 Pure hypercholesterolemia, unspecified: Secondary | ICD-10-CM | POA: Diagnosis not present

## 2020-06-25 DIAGNOSIS — M19012 Primary osteoarthritis, left shoulder: Secondary | ICD-10-CM | POA: Insufficient documentation

## 2020-06-25 DIAGNOSIS — I10 Essential (primary) hypertension: Secondary | ICD-10-CM | POA: Diagnosis not present

## 2020-06-25 DIAGNOSIS — Z01810 Encounter for preprocedural cardiovascular examination: Secondary | ICD-10-CM | POA: Diagnosis not present

## 2020-06-25 DIAGNOSIS — E785 Hyperlipidemia, unspecified: Secondary | ICD-10-CM | POA: Insufficient documentation

## 2020-06-25 DIAGNOSIS — M25512 Pain in left shoulder: Secondary | ICD-10-CM | POA: Diagnosis not present

## 2020-06-25 DIAGNOSIS — D693 Immune thrombocytopenic purpura: Secondary | ICD-10-CM | POA: Insufficient documentation

## 2020-06-25 DIAGNOSIS — M12812 Other specific arthropathies, not elsewhere classified, left shoulder: Secondary | ICD-10-CM | POA: Diagnosis not present

## 2020-06-25 DIAGNOSIS — Z471 Aftercare following joint replacement surgery: Secondary | ICD-10-CM | POA: Diagnosis not present

## 2020-06-25 HISTORY — PX: REVERSE SHOULDER ARTHROPLASTY: SHX5054

## 2020-06-25 LAB — CBC WITH DIFFERENTIAL/PLATELET
Abs Immature Granulocytes: 0.23 10*3/uL — ABNORMAL HIGH (ref 0.00–0.07)
Basophils Absolute: 0.1 10*3/uL (ref 0.0–0.1)
Basophils Relative: 1 %
Eosinophils Absolute: 0.2 10*3/uL (ref 0.0–0.5)
Eosinophils Relative: 2 %
HCT: 28.8 % — ABNORMAL LOW (ref 36.0–46.0)
Hemoglobin: 9.7 g/dL — ABNORMAL LOW (ref 12.0–15.0)
Immature Granulocytes: 1 %
Lymphocytes Relative: 32 %
Lymphs Abs: 5.1 10*3/uL — ABNORMAL HIGH (ref 0.7–4.0)
MCH: 29.7 pg (ref 26.0–34.0)
MCHC: 33.7 g/dL (ref 30.0–36.0)
MCV: 88.1 fL (ref 80.0–100.0)
Monocytes Absolute: 1.8 10*3/uL — ABNORMAL HIGH (ref 0.1–1.0)
Monocytes Relative: 11 %
Neutro Abs: 8.6 10*3/uL — ABNORMAL HIGH (ref 1.7–7.7)
Neutrophils Relative %: 53 %
Platelets: 217 10*3/uL (ref 150–400)
RBC: 3.27 MIL/uL — ABNORMAL LOW (ref 3.87–5.11)
RDW: 18.2 % — ABNORMAL HIGH (ref 11.5–15.5)
WBC: 17.7 10*3/uL — ABNORMAL HIGH (ref 4.0–10.5)
nRBC: 0.2 % (ref 0.0–0.2)

## 2020-06-25 LAB — SARS CORONAVIRUS 2 (TAT 6-24 HRS): SARS Coronavirus 2: NEGATIVE

## 2020-06-25 SURGERY — ARTHROPLASTY, SHOULDER, TOTAL, REVERSE
Anesthesia: General | Site: Shoulder | Laterality: Left

## 2020-06-25 MED ORDER — FENTANYL CITRATE (PF) 100 MCG/2ML IJ SOLN
INTRAMUSCULAR | Status: AC
Start: 2020-06-25 — End: 2020-06-25
  Administered 2020-06-25: 50 ug via INTRAVENOUS
  Filled 2020-06-25: qty 2

## 2020-06-25 MED ORDER — DEXAMETHASONE SODIUM PHOSPHATE 10 MG/ML IJ SOLN
INTRAMUSCULAR | Status: DC | PRN
  Administered 2020-06-25: 8 mg via INTRAVENOUS

## 2020-06-25 MED ORDER — BUPIVACAINE HCL (PF) 0.5 % IJ SOLN
INTRAMUSCULAR | Status: AC
  Filled 2020-06-25: qty 10

## 2020-06-25 MED ORDER — LIDOCAINE HCL (PF) 1 % IJ SOLN
INTRAMUSCULAR | Status: AC
  Filled 2020-06-25: qty 5

## 2020-06-25 MED ORDER — LIDOCAINE HCL (PF) 2 % IJ SOLN
INTRAMUSCULAR | Status: AC
  Filled 2020-06-25: qty 5

## 2020-06-25 MED ORDER — PHENYLEPHRINE HCL (PRESSORS) 10 MG/ML IV SOLN
INTRAVENOUS | Status: DC | PRN
  Administered 2020-06-25: 100 ug via INTRAVENOUS

## 2020-06-25 MED ORDER — SODIUM CHLORIDE 0.9 % BOLUS PEDS
250.0000 mL | Freq: Once | INTRAVENOUS | Status: AC
  Administered 2020-06-25: 250 mL via INTRAVENOUS

## 2020-06-25 MED ORDER — CHLORHEXIDINE GLUCONATE 0.12 % MT SOLN
OROMUCOSAL | Status: AC
  Filled 2020-06-25: qty 15

## 2020-06-25 MED ORDER — FENTANYL CITRATE (PF) 100 MCG/2ML IJ SOLN
INTRAMUSCULAR | Status: DC
Start: 2020-06-25 — End: 2020-06-25
  Filled 2020-06-25: qty 2

## 2020-06-25 MED ORDER — CEFAZOLIN SODIUM-DEXTROSE 2-4 GM/100ML-% IV SOLN
INTRAVENOUS | Status: AC
  Administered 2020-06-25: 2 g via INTRAVENOUS
  Filled 2020-06-25: qty 100

## 2020-06-25 MED ORDER — ROCURONIUM BROMIDE 10 MG/ML (PF) SYRINGE
PREFILLED_SYRINGE | INTRAVENOUS | Status: AC
  Filled 2020-06-25: qty 10

## 2020-06-25 MED ORDER — BUPIVACAINE-EPINEPHRINE (PF) 0.5% -1:200000 IJ SOLN
INTRAMUSCULAR | Status: DC | PRN
  Administered 2020-06-25: 30 mL

## 2020-06-25 MED ORDER — PHENYLEPHRINE HCL (PRESSORS) 10 MG/ML IV SOLN
INTRAVENOUS | Status: AC
  Filled 2020-06-25: qty 1

## 2020-06-25 MED ORDER — METOCLOPRAMIDE HCL 5 MG/ML IJ SOLN: 5.0000 mg | Freq: Three times a day (TID) | INTRAMUSCULAR | Status: DC | PRN

## 2020-06-25 MED ORDER — TRANEXAMIC ACID 1000 MG/10ML IV SOLN
INTRAVENOUS | Status: AC
  Filled 2020-06-25: qty 10

## 2020-06-25 MED ORDER — CEFAZOLIN SODIUM-DEXTROSE 2-4 GM/100ML-% IV SOLN
INTRAVENOUS | Status: AC
  Filled 2020-06-25: qty 100

## 2020-06-25 MED ORDER — FENTANYL CITRATE (PF) 100 MCG/2ML IJ SOLN
50.0000 ug | INTRAMUSCULAR | Status: AC | PRN
  Administered 2020-06-25: 50 ug via INTRAVENOUS

## 2020-06-25 MED ORDER — MIDAZOLAM HCL 2 MG/2ML IJ SOLN
INTRAMUSCULAR | Status: AC
  Administered 2020-06-25: 0.5 mg
  Filled 2020-06-25: qty 2

## 2020-06-25 MED ORDER — MIDAZOLAM HCL 2 MG/2ML IJ SOLN
0.5000 mg | Freq: Once | INTRAMUSCULAR | Status: AC
  Administered 2020-06-25: 0.5 mg via INTRAVENOUS

## 2020-06-25 MED ORDER — CEFAZOLIN SODIUM-DEXTROSE 2-4 GM/100ML-% IV SOLN
2.0000 g | INTRAVENOUS | Status: AC
  Administered 2020-06-25: 2 g via INTRAVENOUS

## 2020-06-25 MED ORDER — BUPIVACAINE LIPOSOME 1.3 % IJ SUSP
INTRAMUSCULAR | Status: AC
  Filled 2020-06-25: qty 20

## 2020-06-25 MED ORDER — OXYCODONE HCL 5 MG PO TABS: 5.0000 mg | ORAL_TABLET | ORAL | 0 refills | Status: DC | PRN

## 2020-06-25 MED ORDER — SUGAMMADEX SODIUM 200 MG/2ML IV SOLN
INTRAVENOUS | Status: DC | PRN
  Administered 2020-06-25: 120 mg via INTRAVENOUS

## 2020-06-25 MED ORDER — SODIUM CHLORIDE FLUSH 0.9 % IV SOLN
INTRAVENOUS | Status: AC
  Filled 2020-06-25: qty 40

## 2020-06-25 MED ORDER — ONDANSETRON HCL 4 MG/2ML IJ SOLN
INTRAMUSCULAR | Status: AC
  Filled 2020-06-25: qty 2

## 2020-06-25 MED ORDER — TRANEXAMIC ACID 1000 MG/10ML IV SOLN
INTRAVENOUS | Status: DC | PRN
  Administered 2020-06-25: 1000 mg via INTRAVENOUS

## 2020-06-25 MED ORDER — VASOPRESSIN 20 UNIT/ML IV SOLN
INTRAVENOUS | Status: AC
  Filled 2020-06-25: qty 1

## 2020-06-25 MED ORDER — GLYCOPYRROLATE 0.2 MG/ML IJ SOLN
INTRAMUSCULAR | Status: AC
  Filled 2020-06-25: qty 1

## 2020-06-25 MED ORDER — VASOPRESSIN 20 UNIT/ML IV SOLN
INTRAVENOUS | Status: DC | PRN
  Administered 2020-06-25: 1 [IU] via INTRAVENOUS
  Administered 2020-06-25 (×4): .5 [IU] via INTRAVENOUS

## 2020-06-25 MED ORDER — LACTATED RINGERS IV SOLN: INTRAVENOUS | Status: DC

## 2020-06-25 MED ORDER — PROPOFOL 10 MG/ML IV BOLUS
INTRAVENOUS | Status: DC | PRN
  Administered 2020-06-25: 100 mg via INTRAVENOUS

## 2020-06-25 MED ORDER — METOCLOPRAMIDE HCL 10 MG PO TABS: 5.0000 mg | ORAL_TABLET | Freq: Three times a day (TID) | ORAL | Status: DC | PRN

## 2020-06-25 MED ORDER — PROPOFOL 10 MG/ML IV BOLUS
INTRAVENOUS | Status: AC
  Filled 2020-06-25: qty 20

## 2020-06-25 MED ORDER — ROCURONIUM BROMIDE 100 MG/10ML IV SOLN
INTRAVENOUS | Status: DC | PRN
  Administered 2020-06-25: 10 mg via INTRAVENOUS
  Administered 2020-06-25: 50 mg via INTRAVENOUS

## 2020-06-25 MED ORDER — ONDANSETRON HCL 4 MG PO TABS: 4.0000 mg | ORAL_TABLET | Freq: Four times a day (QID) | ORAL | Status: DC | PRN

## 2020-06-25 MED ORDER — ORAL CARE MOUTH RINSE: 15.0000 mL | Freq: Once | OROMUCOSAL | Status: AC

## 2020-06-25 MED ORDER — TRANEXAMIC ACID 1000 MG/10ML IV SOLN
INTRAVENOUS | Status: DC | PRN
  Administered 2020-06-25: 10 mg via INTRAVENOUS

## 2020-06-25 MED ORDER — POTASSIUM CHLORIDE IN NACL 20-0.9 MEQ/L-% IV SOLN: INTRAVENOUS | Status: DC

## 2020-06-25 MED ORDER — OXYCODONE HCL 5 MG PO TABS: 5.0000 mg | ORAL_TABLET | ORAL | Status: DC | PRN

## 2020-06-25 MED ORDER — DEXAMETHASONE SODIUM PHOSPHATE 10 MG/ML IJ SOLN
INTRAMUSCULAR | Status: AC
  Filled 2020-06-25: qty 1

## 2020-06-25 MED ORDER — GLYCOPYRROLATE 0.2 MG/ML IJ SOLN
INTRAMUSCULAR | Status: DC | PRN
  Administered 2020-06-25: .2 mg via INTRAVENOUS

## 2020-06-25 MED ORDER — LIDOCAINE HCL (CARDIAC) PF 100 MG/5ML IV SOSY
PREFILLED_SYRINGE | INTRAVENOUS | Status: DC | PRN
  Administered 2020-06-25: 60 mg via INTRAVENOUS

## 2020-06-25 MED ORDER — BUPIVACAINE LIPOSOME 1.3 % IJ SUSP
INTRAMUSCULAR | Status: DC | PRN
  Administered 2020-06-25: 20 mL via PERINEURAL

## 2020-06-25 MED ORDER — ACETAMINOPHEN 500 MG PO TABS
ORAL_TABLET | ORAL | Status: AC
  Administered 2020-06-25: 1000 mg via ORAL
  Filled 2020-06-25: qty 2

## 2020-06-25 MED ORDER — ONDANSETRON HCL 4 MG/2ML IJ SOLN: 4.0000 mg | Freq: Once | INTRAMUSCULAR | Status: DC | PRN

## 2020-06-25 MED ORDER — ACETAMINOPHEN 500 MG PO TABS: 1000.0000 mg | ORAL_TABLET | Freq: Four times a day (QID) | ORAL | Status: DC

## 2020-06-25 MED ORDER — CEFAZOLIN SODIUM-DEXTROSE 2-4 GM/100ML-% IV SOLN: 2.0000 g | Freq: Four times a day (QID) | INTRAVENOUS | Status: DC

## 2020-06-25 MED ORDER — FENTANYL CITRATE (PF) 100 MCG/2ML IJ SOLN
25.0000 ug | INTRAMUSCULAR | Status: DC | PRN
  Administered 2020-06-25 (×4): 25 ug via INTRAVENOUS

## 2020-06-25 MED ORDER — ONDANSETRON HCL 4 MG/2ML IJ SOLN
INTRAMUSCULAR | Status: DC | PRN
  Administered 2020-06-25: 4 mg via INTRAVENOUS

## 2020-06-25 MED ORDER — CHLORHEXIDINE GLUCONATE 0.12 % MT SOLN
15.0000 mL | Freq: Once | OROMUCOSAL | Status: AC
  Administered 2020-06-25: 15 mL via OROMUCOSAL

## 2020-06-25 MED ORDER — FENTANYL CITRATE (PF) 100 MCG/2ML IJ SOLN
INTRAMUSCULAR | Status: AC
  Filled 2020-06-25: qty 2

## 2020-06-25 MED ORDER — LIDOCAINE HCL (PF) 1 % IJ SOLN
INTRAMUSCULAR | Status: DC | PRN
  Administered 2020-06-25: 3 mL

## 2020-06-25 MED ORDER — ONDANSETRON HCL 4 MG/2ML IJ SOLN: 4.0000 mg | Freq: Four times a day (QID) | INTRAMUSCULAR | Status: DC | PRN

## 2020-06-25 MED ORDER — BUPIVACAINE HCL (PF) 0.5 % IJ SOLN
INTRAMUSCULAR | Status: DC | PRN
  Administered 2020-06-25: 10 mL via PERINEURAL

## 2020-06-25 SURGICAL SUPPLY — 72 items
APL PRP STRL LF DISP 70% ISPRP (MISCELLANEOUS) ×1
BASEPLATE GLENOSPHERE 25 (Plate) ×1 IMPLANT
BASEPLATE GLENOSPHERE 25MM (Plate) ×1 IMPLANT
BEARING HUMERAL SHLDER 36M STD (Shoulder) IMPLANT
BIT DRILL TWIST 2.7 (BIT) ×1 IMPLANT
BIT DRILL TWIST 2.7MM (BIT) ×1
BLADE SAW SAG 25X90X1.19 (BLADE) ×3 IMPLANT
BRNG HUM STD 36 RVRS SHLDR (Shoulder) ×1 IMPLANT
CANISTER SUCT 1200ML W/VALVE (MISCELLANEOUS) ×3 IMPLANT
CANISTER SUCT 3000ML PPV (MISCELLANEOUS) ×6 IMPLANT
CHLORAPREP W/TINT 26 (MISCELLANEOUS) ×3 IMPLANT
COOLER POLAR GLACIER W/PUMP (MISCELLANEOUS) ×3 IMPLANT
COVER BACK TABLE REUSABLE LG (DRAPES) ×3 IMPLANT
COVER WAND RF STERILE (DRAPES) ×3 IMPLANT
DRAPE 3/4 80X56 (DRAPES) ×6 IMPLANT
DRAPE IMP U-DRAPE 54X76 (DRAPES) ×6 IMPLANT
DRAPE INCISE IOBAN 66X45 STRL (DRAPES) ×6 IMPLANT
DRSG OPSITE POSTOP 4X8 (GAUZE/BANDAGES/DRESSINGS) ×3 IMPLANT
ELECT BLADE 6.5 EXT (BLADE) IMPLANT
ELECT CAUTERY BLADE 6.4 (BLADE) ×3 IMPLANT
GLENOID SPHERE STD STRL 36MM (Orthopedic Implant) ×2 IMPLANT
GLOVE BIO SURGEON STRL SZ7.5 (GLOVE) ×12 IMPLANT
GLOVE BIO SURGEON STRL SZ8 (GLOVE) ×12 IMPLANT
GLOVE BIOGEL PI IND STRL 8 (GLOVE) ×1 IMPLANT
GLOVE BIOGEL PI INDICATOR 8 (GLOVE) ×2
GLOVE INDICATOR 8.0 STRL GRN (GLOVE) ×3 IMPLANT
GOWN STRL REUS W/ TWL LRG LVL3 (GOWN DISPOSABLE) ×1 IMPLANT
GOWN STRL REUS W/ TWL XL LVL3 (GOWN DISPOSABLE) ×1 IMPLANT
GOWN STRL REUS W/TWL LRG LVL3 (GOWN DISPOSABLE) ×3
GOWN STRL REUS W/TWL XL LVL3 (GOWN DISPOSABLE) ×3
HOOD PEEL AWAY FLYTE STAYCOOL (MISCELLANEOUS) ×11 IMPLANT
ILLUMINATOR WAVEGUIDE N/F (MISCELLANEOUS) ×3 IMPLANT
KIT STABILIZATION SHOULDER (MISCELLANEOUS) ×3 IMPLANT
KIT TURNOVER KIT A (KITS) ×3 IMPLANT
MASK FACE SPIDER DISP (MASK) ×3 IMPLANT
MAT ABSORB  FLUID 56X50 GRAY (MISCELLANEOUS) ×3
MAT ABSORB FLUID 56X50 GRAY (MISCELLANEOUS) ×1 IMPLANT
NDL SAFETY ECLIPSE 18X1.5 (NEEDLE) ×1 IMPLANT
NDL SPNL 20GX3.5 QUINCKE YW (NEEDLE) ×1 IMPLANT
NEEDLE HYPO 18GX1.5 SHARP (NEEDLE) ×3
NEEDLE HYPO 22GX1.5 SAFETY (NEEDLE) ×3 IMPLANT
NEEDLE SPNL 20GX3.5 QUINCKE YW (NEEDLE) ×3 IMPLANT
NS IRRIG 500ML POUR BTL (IV SOLUTION) ×3 IMPLANT
PACK SHDR ARTHRO (MISCELLANEOUS) ×3 IMPLANT
PAD ARMBOARD 7.5X6 YLW CONV (MISCELLANEOUS) ×3 IMPLANT
PAD WRAPON POLAR SHDR UNIV (MISCELLANEOUS) ×1 IMPLANT
PENCIL SMOKE EVACUATOR (MISCELLANEOUS) ×3 IMPLANT
PIN THREADED REVERSE (PIN) ×2 IMPLANT
PULSAVAC PLUS IRRIG FAN TIP (DISPOSABLE) ×3
SCREW BONE LOCKING 4.75X30X3.5 (Screw) ×2 IMPLANT
SCREW BONE STRL 6.5MMX30MM (Screw) ×2 IMPLANT
SCREW LOCKING 4.75MMX15MM (Screw) ×4 IMPLANT
SCREW NON-LOCK 4.75MMX15MM (Screw) ×2 IMPLANT
SHOULDER HUMERAL BEAR 36M STD (Shoulder) ×3 IMPLANT
SLING ULTRA II M (MISCELLANEOUS) ×3 IMPLANT
SOL .9 NS 3000ML IRR  AL (IV SOLUTION) ×3
SOL .9 NS 3000ML IRR AL (IV SOLUTION) ×1
SOL .9 NS 3000ML IRR UROMATIC (IV SOLUTION) ×1 IMPLANT
SPONGE LAP 18X18 RF (DISPOSABLE) ×3 IMPLANT
STAPLER SKIN PROX 35W (STAPLE) ×3 IMPLANT
STEM HUMERAL STRL 10MMX55MM (Stem) ×2 IMPLANT
SUT ETHIBOND 0 MO6 C/R (SUTURE) ×3 IMPLANT
SUT FIBERWIRE #2 38 BLUE 1/2 (SUTURE) ×12
SUT VIC AB 0 CT1 36 (SUTURE) ×3 IMPLANT
SUT VIC AB 2-0 CT1 27 (SUTURE) ×6
SUT VIC AB 2-0 CT1 TAPERPNT 27 (SUTURE) ×2 IMPLANT
SUTURE FIBERWR #2 38 BLUE 1/2 (SUTURE) ×4 IMPLANT
SYR 10ML LL (SYRINGE) ×3 IMPLANT
SYR 30ML LL (SYRINGE) IMPLANT
TIP FAN IRRIG PULSAVAC PLUS (DISPOSABLE) ×1 IMPLANT
TRAY HUM MINI SHOULDER +0 40D (Shoulder) ×2 IMPLANT
WRAPON POLAR PAD SHDR UNIV (MISCELLANEOUS) ×3

## 2020-06-25 NOTE — Anesthesia Procedure Notes (Signed)
Procedure Name: Intubation Date/Time: 06/25/2020 8:06 AM Performed by: Rona Ravens, CRNA Pre-anesthesia Checklist: Patient identified, Emergency Drugs available, Suction available, Patient being monitored and Timeout performed Patient Re-evaluated:Patient Re-evaluated prior to induction Oxygen Delivery Method: Circle system utilized Preoxygenation: Pre-oxygenation with 100% oxygen Induction Type: IV induction Ventilation: Mask ventilation without difficulty Laryngoscope Size: Mac and 3 Grade View: Grade II Tube type: Oral Tube size: 7.0 mm Number of attempts: 1 Placement Confirmation: ETT inserted through vocal cords under direct vision,  positive ETCO2,  CO2 detector and breath sounds checked- equal and bilateral Secured at: 20 cm Tube secured with: Tape Dental Injury: Teeth and Oropharynx as per pre-operative assessment

## 2020-06-25 NOTE — Progress Notes (Signed)
Dr. Andree Elk in to evaluate patient.  Will set up for block and attempt shortly.  Patient dozes on and off, then will yell and cry to please help her.

## 2020-06-25 NOTE — Progress Notes (Signed)
Patient calmer, more relaxed and states she can feel it is working.  Patient able to move her fingers on her left hand and has sensation.  Ice chips given per patient request.

## 2020-06-25 NOTE — Anesthesia Procedure Notes (Signed)
Anesthesia Regional Block: Interscalene brachial plexus block   Pre-Anesthetic Checklist: ,, timeout performed, Correct Patient, Correct Site, Correct Laterality, Correct Procedure, Correct Position, site marked, Risks and benefits discussed,  Surgical consent,  Pre-op evaluation,  At surgeon's request and post-op pain management  Laterality: Left  Prep: chloraprep       Needles:  Injection technique: Single-shot  Needle Type: Echogenic Stimulator Needle     Needle Length: 10cm  Needle Gauge: 20     Additional Needles:   Procedures:, nerve stimulator,,, ultrasound used (permanent image in chart),,,,  Narrative:  Injection made incrementally with aspirations every 5 mL.  Performed by: Personally   Additional Notes: Functioning IV was confirmed and monitors were applied. Sterile prep and drape,hand hygiene and sterile gloves were used.  Negative aspiration and negative test dose prior to incremental administration of local anesthetic. The patient tolerated the procedure well.

## 2020-06-25 NOTE — Progress Notes (Signed)
Patient states its feeling better.  Patient is referring to her left shoulder.

## 2020-06-25 NOTE — Anesthesia Postprocedure Evaluation (Signed)
Anesthesia Post Note  Patient: Ariana [name]  Procedure(s) Performed: REVERSE SHOULDER ARTHROPLASTY (Left Shoulder)  Patient location during evaluation: PACU Anesthesia Type: General Level of consciousness: awake and alert Pain management: pain level controlled Vital Signs Assessment: post-procedure vital signs reviewed and stable Respiratory status: spontaneous breathing, nonlabored ventilation, respiratory function stable and patient connected to nasal cannula oxygen Cardiovascular status: blood pressure returned to baseline and stable Postop Assessment: no apparent nausea or vomiting Anesthetic complications: no   No complications documented.   Last Vitals:  Vitals:   06/25/20 0730 06/25/20 1014  BP: (!) 156/62   Pulse: (!) 51   Resp: (!) 7   Temp:  36.4 C  SpO2: 100%     Last Pain:  Vitals:   06/25/20 0623  TempSrc: Tympanic  PainSc: 0-No pain                 Molli Barrows

## 2020-06-25 NOTE — Progress Notes (Signed)
°   06/25/20 0755  Clinical Encounter Type  Visited With Family  Visit Type Initial  Referral From Chaplain  Consult/Referral To Chaplain  While rounding SDS waiting area, chaplain spoke with pt's husband and he said all is well and that he doesn't have any questions or concerns.

## 2020-06-25 NOTE — Progress Notes (Signed)
[id number] Block done in PACU by Dr. Andree Elk.

## 2020-06-25 NOTE — Transfer of Care (Signed)
Immediate Anesthesia Transfer of Care Note  Patient: Ariana [name]  Procedure(s) Performed: REVERSE SHOULDER ARTHROPLASTY (Left Shoulder)  Patient Location: PACU  Anesthesia Type:General  Level of Consciousness: awake and alert   Airway & Oxygen Therapy: Patient Spontanous Breathing and Patient connected to face mask oxygen  Post-op Assessment: Report given to RN and Post -op Vital signs reviewed and stable  Post vital signs: Reviewed and stable  Last Vitals:  Vitals Value Taken Time  BP 107/76 06/25/20 1013  Temp    Pulse 59 06/25/20 1016  Resp 17 06/25/20 1016  SpO2 95 % 06/25/20 1016  Vitals shown include unvalidated device data.  Last Pain:  Vitals:   06/25/20 0623  TempSrc: Tympanic  PainSc: 0-No pain         Complications: No complications documented.

## 2020-06-25 NOTE — Progress Notes (Signed)
Dr. Andree Elk aware of restless and confusion of patient. Attempting to assist patient with pain and comfort level. Order recv'd for versed.

## 2020-06-25 NOTE — Progress Notes (Signed)
Xray completed.  Patient restless, aggravated and wanting to go home.Pain medication given as ordered and directed.  Reassured patient and will continue to Monitor patient.

## 2020-06-25 NOTE — Discharge Instructions (Addendum)
Orthopedic discharge instructions: May shower with intact op-site dressing. Apply ice frequently to shoulder. Take pain meds as prescribed when needed.  May supplement with ES Tylenol if necessary. Keep shoulder immobilizer on at all times except may remove for bathing purposes and for exercises. Start Home PT as scheduled tomorrow. Follow-up in 10-14 days or as scheduled.  AMBULATORY SURGERY  DISCHARGE INSTRUCTIONS   1) The drugs that you were given will stay in your system until tomorrow so for the next 24 hours you should not:  A) Drive an automobile B) Make any legal decisions C) Drink any alcoholic beverage   2) You may resume regular meals tomorrow.  Today it is better to start with liquids and gradually work up to solid foods.  You may eat anything you prefer, but it is better to start with liquids, then soup and crackers, and gradually work up to solid foods.   3) Please notify your doctor immediately if you have any unusual bleeding, trouble breathing, redness and pain at the surgery site, drainage, fever, or pain not relieved by medication.    4) Additional Instructions:        Please contact your physician with any problems or Same Day Surgery at [phone], Monday through Friday 6 am to 4 pm, or [place] at [facility] number at [phone].

## 2020-06-25 NOTE — Addendum Note (Signed)
Addendum  created 06/25/20 1120 by Molli Barrows, MD   Child order released for a procedure order, Clinical Note Signed, Intraprocedure Blocks edited

## 2020-06-25 NOTE — H&P (Addendum)
History of Present Illness: Ariana [name] is an 86 y.o. who present today for history and physical. She is to undergo a left shoulder total reverse arthroplasty on 06/25/2020. Last seen in clinic on 04/29/2020.  The patient has had significant left shoulder discomfort over the past several years however it has worsened since she had a fall 1 week ago. The patient lost her balance and fell backwards into the left and does believe that she hit the left shoulder. Since then she has not had any loss of motion in which she was previously experiencing however the motion that she is experiencing now is much more painful. She denies any deformity to the left shoulder. She denies any numbness or tingling to left upper extremity. The patient is right-hand dominant. She does have a history of undergoing a left rotator cuff repair but she cannot remember what year she underwent this procedure. She does take chronic pain medication for ongoing low back discomfort. She has had increased pain at night to sleep on the left side.  Patient states her pain is increased to the point that it is significantly interfering with her activities of daily living and has elected to proceed with surgery  Past Medical History: . Allergic rhinitis  . Asthma without status asthmaticus, unspecified  . Atrial fibrillation (CMS-HCC)  . Benign fundic gland polyps of stomach 09/10/2015  . Carotid bruit  . Cervicalgia  . Chickenpox  . DDD (degenerative disc disease)  CERVICAL/LUMBAR  . DDD (degenerative disc disease)  CERVICAL/LUMBAR/HANDS  . DDD (degenerative disc disease), lumbar 02/27/2014  . Dementia (CMS-HCC)  . Depression  . Disorders of bursae and tendons in shoulder region, unspecified  . DOE (dyspnea on exertion)  NORMAL CVARDIAC CATH AND NORMAL RT HEART PRESSURES BY CATH 12/08/11.  . Erosive gastritis 09/10/2015  . Essential hypertension, benign  . Fracture of distal fibula  LEFT  . GERD (gastroesophageal reflux  disease)  . Hyperlipidemia  . Idiopathic thrombocythemia (CMS-HCC)  FOLLOWED BY DR. Inez Pilgrim  . Insomnia  . Osteoarthritis  . Osteoporosis, post-menopausal  . PE (pulmonary embolism) (CMS-HCC)  . Restless leg syndrome  . Shingles  . Stroke (CMS-HCC)   Past Surgical History: . ARTHROSCOPY SHOULDER Right  . Back surgery  . CAROTID ENDARTERECTOMY  . CARPAL TUNNEL RELEASE  . EGD 09/10/2015  Erosive gastritis/fundic gland polyp/No Repeat/MUS  . ENDOSCOPIC CARPAL TUNNEL RELEASE Bilateral  . HYSTERECTOMY  . Nasal surgery  . OOPHORECTOMY  . SPINE SURGERY  . SPLENECTOMY   Past Family History: . Diabetes type II Son  . High blood pressure (Hypertension) Mother  . Aneurysm Mother  . Stroke Mother  . Clotting disorder Mother  . No Known Problems Father  . Colon cancer Neg Hx  . Colon polyps Neg Hx  . Rectal cancer Neg Hx  . Liver disease Neg Hx  . Ulcers Neg Hx   Medications: . acetaminophen (TYLENOL) 325 MG tablet Take 650 mg by mouth every 4 (four) hours as needed.  Marland Kitchen albuterol (PROVENTIL) 2.5 mg /3 mL (0.083 %) nebulizer solution Take 3 mLs (2.5 mg total) by nebulization every 6 (six) hours as needed for Wheezing 75 mL 12  . albuterol 90 mcg/actuation inhaler Inhale 2 inhalations into the lungs every 6 (six) hours as needed for Wheezing or Shortness of Breath 3 Inhaler 1  . ALPRAZolam (XANAX) 0.5 MG tablet Take 1 tablet (0.5 mg total) by mouth 3 (three) times daily as needed for Anxiety 180 tablet 1  . ascorbic acid,  vitamin C, (VITAMIN C) 500 MG tablet Take 500 mg by mouth once daily  . buPROPion (WELLBUTRIN XL) 150 MG XL tablet Take 1 tablet (150 mg total) by mouth once daily 90 tablet 1  . calcium carbonate-vit D3-min 600 mg calcium- 400 unit Tab Take by mouth 2 (two) times daily.  . citalopram (CELEXA) 40 MG tablet Take 1 tablet (40 mg total) by mouth once daily 90 tablet 3  . cyanocobalamin, vitamin B-12, 1,000 mcg TbER Take 1 tablet by mouth once daily.  . fluticasone  propion-salmeteroL (ADVAIR DISKUS) 250-50 mcg/dose diskus inhaler Inhale 1 inhalation into the lungs every 12 (twelve) hours 3 Inhaler 1  . fluticasone propionate (FLONASE) 50 mcg/actuation nasal spray Place 1 spray into both nostrils 2 (two) times daily 48 g 3  . gabapentin (NEURONTIN) 300 MG capsule Take 1 capsule (300 mg total) by mouth 4 (four) times daily 360 capsule 3  . geriatric multivitamin-mineral 0.5 mg-0.6 mg-7 mg-0.7 (ELDERTONIC) 0.5-0.6-7-0.7 mg Elix oral elixir Take 10 mLs by mouth once daily 300 mL 5  . levothyroxine (SYNTHROID) 50 MCG tablet Take 1 tablet (50 mcg total) by mouth once daily 90 tablet 0  . memantine (NAMENDA) 10 MG tablet TAKE 1 TABLET TWICE DAILY 180 tablet 3  . montelukast (SINGULAIR) 10 mg tablet TAKE 1 TABLET EVERY NIGHT 90 tablet 3  . oxyCODONE-acetaminophen (PERCOCET) 10-325 mg tablet Take 1 tablet by mouth every 6 (six) hours as needed for Pain for up to 30 days 120 tablet 0  . pantoprazole (PROTONIX) 20 MG DR tablet Take 1 tablet by mouth twice a day 180 tablet 3  . pravastatin (PRAVACHOL) 20 MG tablet Take 1 tablet (20 mg total) by mouth once daily 90 tablet 3  . SSD 1 % cream APPLY TOPICALLY TWICE DAILY (Patient taking differently: As needed ) 50 g 0  . vitamin E 400 UNIT capsule Take 400 Units by mouth once daily  . busPIRone (BUSPAR) 15 MG tablet Take 1 tablet (15 mg total) by mouth 2 (two) times daily for 180 days 180 tablet 1  . donepeziL (ARICEPT) 10 MG tablet Take 1 tablet (10 mg total) by mouth nightly for 180 days 90 tablet 1  . oxybutynin (DITROPAN) 5 mg tablet Take 1 tablet (5 mg total) by mouth 3 (three) times daily for 180 days 270 tablet 1   Allergies: . Tetanus Toxoid: Adsorbed Swelling, localized superficial swelling of skin . Asa Buff (Mag Carb-Al Glyc) [Aspirin, Buffered] Unknown  . Aspirin: Distress (finding), Upset stomach  . Diazepam: Itching of Skin, sneezing  . Tetanus And Diphtheria Toxoids, Adsorbed, Adult Swelling  . Morphine  Hives, Itching and Rash   Review of Systems: A comprehensive 14 point ROS was performed, reviewed, and the pertinent orthopaedic findings are documented in the HPI.  Physical Exam: BP 138/82  Ht 160 cm (5\' 3" )  Wt 57.2 kg (126 lb 3.2 oz)  LMP (LMP Unknown)  BMI 22.36 kg/m   General: Well-developed well-nourished female seen in no acute distress.   HEENT: Atraumatic,normocephalic. Pupils are equal and reactive to light. Oropharynx is clear with moist mucosa  Lungs: Clear to auscultation bilaterally   Cardiovascular: Regular rate and rhythm. Normal S1, S2. No murmurs. No appreciable gallops or rubs. Peripheral pulses are palpable.  Abdomen: Soft, non-tender, nondistended. Bowel sounds present  Left Upper Extremity: Examination of the left shoulder and arm showed no bony abnormality or edema. The patient does have decreased active range of motion to the left upper extremity.  With forward flexion she is able to achieve 80 degrees, passively 105 degrees. With abduction she is able to achieve 75 degrees, passively 140 degrees. Increased discomfort with both internal and external rotation to the left upper extremity. The patient has a positive Hawkins test and a positive impingement test. The patient has a positive drop arm test. Moderate tenderness with palpation throughout the left shoulder, including in the subacromial space.. The patient has no instability of the shoulder with anterior-posterior motion. There is a negative sulcus sign. The rotator cuff muscle strength is 2-3/5 with supraspinatus, 3-4/5 with internal rotation, and 3/5 with external rotation. There is no crepitus with range of motion activities.   Neurological:  The patient is alert and oriented Sensation to light touch appears to be intact and within normal limits Gross motor strength appeared to be equal to 5/5  Vascular :  Peripheral pulses felt to be palpable. Capillary refill appears to be intact and within  normal limits No swelling or edema to the lower extremities  X-ray: X-rays of the left shoulder taken in [place] [facility] showed significant degenerative changes with complete loss of the glenohumeral articular space. The subacromial space was markedly decreased as well. She has a type I acromion. Moderate AC joint arthropathy. Inferior osteophyte formation noted to the humeral head.  Impression: Advanced rotator cuff arthropathy, left shoulder  Plan:  The treatment options have been discussed in detail with the patient and her family. The risks (including bleeding, infection, nerve and/or blood vessel injury, persistent or recurrent pain/weakness, loosening of or failure of the components, stiffness, dislocation, need for further surgery, blood clots, strokes, heart attacks or arrhythmias, pneumonia, etc.) and benefits of the surgical procedure were discussed. The patient states her understanding and agrees to proceed. She agrees to a blood transfusion if necessary. A formal written consent will be obtained by the nursing staff.   H&P reviewed and patient re-examined. No changes.

## 2020-06-25 NOTE — Anesthesia Preprocedure Evaluation (Signed)
Anesthesia Evaluation  Patient identified by MRN, date of birth, ID band Patient awake    Reviewed: Allergy & Precautions, H&P , NPO status , Patient's Chart, lab work & pertinent test results, reviewed documented beta blocker date and time   Airway Mallampati: II  TM Distance: >3 FB Neck ROM: full    Dental  (+) Teeth Intact   Pulmonary neg shortness of breath, asthma , sleep apnea , pneumonia, resolved,    Pulmonary exam normal        Cardiovascular Exercise Tolerance: Poor hypertension, On Medications + dysrhythmias Atrial Fibrillation  Rhythm:regular Rate:Normal     Neuro/Psych Anxiety Depression Dementia  Neuromuscular disease negative psych ROS   GI/Hepatic Neg liver ROS, hiatal hernia, GERD  Medicated,  Endo/Other  Hypothyroidism   Renal/GU negative Renal ROS  negative genitourinary   Musculoskeletal   Abdominal   Peds  Hematology  (+) Blood dyscrasia, anemia ,   Anesthesia Other Findings Past Medical History: No date: Alzheimer disease ([place]) No date: Anemia No date: Anxiety No date: Asthma     Comment:  WELL CONTROLLED No date: Chronic back pain No date: Depression No date: Depression No date: GERD (gastroesophageal reflux disease) No date: Hiatal hernia No date: Hypercholesteremia No date: Hypothyroidism No date: ITP (idiopathic thrombocytopenic purpura)     Comment:  FOLLOWED BR DR Grayland Ormond No date: LBBB (left bundle branch block) No date: Osteoarthritis No date: Osteoporosis No date: Restless legs Past Surgical History: No date: CARPAL TUNNEL RELEASE 09/10/2015: ESOPHAGOGASTRODUODENOSCOPY (EGD) WITH PROPOFOL; N/A     Comment:  Procedure: ESOPHAGOGASTRODUODENOSCOPY (EGD) WITH               PROPOFOL;  Surgeon: Lollie Sails, MD;  Location:               [facility] ENDOSCOPY;  Service: Endoscopy;  Laterality: N/A; 07/13/2018: IRRIGATION AND DEBRIDEMENT HEMATOMA; Right     Comment:  Procedure:  IRRIGATION AND DEBRIDEMENT HEMATOMA-RIGHT               SHIN;  Surgeon: Herbert Pun, MD;  Location:               [facility] ORS;  Service: General;  Laterality: Right; 05/02/2019: KYPHOPLASTY; N/A     Comment:  Procedure: L1 KYPHOPLASTY;  Surgeon: Hessie Knows, MD;               Location: [facility] ORS;  Service: Orthopedics;  Laterality:               N/A; No date: LUMBAR DISC SURGERY No date: SPLENECTOMY, PARTIAL No date: TOTAL ABDOMINAL HYSTERECTOMY   Reproductive/Obstetrics negative OB ROS                             Anesthesia Physical Anesthesia Plan  ASA: III  Anesthesia Plan: General ETT   Post-op Pain Management:  Regional for Post-op pain   Induction:   PONV Risk Score and Plan:   Airway Management Planned:   Additional Equipment:   Intra-op Plan:   Post-operative Plan:   Informed Consent: I have reviewed the patients History and Physical, chart, labs and discussed the procedure including the risks, benefits and alternatives for the proposed anesthesia with the patient or authorized representative who has indicated his/her understanding and acceptance.     Dental Advisory Given  Plan Discussed with: CRNA  Anesthesia Plan Comments: (Platelet count noted and chronically low.  Plt. Available if indicated.  Situation  discussed with patient and husband.  JA)        Anesthesia Quick Evaluation

## 2020-06-25 NOTE — Op Note (Signed)
06/25/2020  9:59 AM  Patient:   Ariana [name]  Pre-Op Diagnosis:   Impingement/tendinopathy with advanced degenerative joint disease, left shoulder.  Post-Op Diagnosis:   Same  Procedure:   Reverse left total shoulder arthroplasty.  Surgeon:   Pascal Lux, MD  Assistant:   Cameron Proud, PA-C  Anesthesia:   General endotracheal with an interscalene block placed post-operatively by the anesthesiologist.  Findings:   As above.  Complications:   None  EBL:    125 cc  Fluids:   1200 cc crystalloid  UOP:   None  TT:   None  Drains:   None  Closure:   Staples  Implants:   All press-fit Bi[employer] Comprehensive system with a #10 micro-humeral stem, a 40 mm humeral tray with a standard insert, and a mini-base plate with a 36 mm glenosphere.  Brief Clinical Note:   The patient is an 86 year old female with a long history of gradually worsening left shoulder pain. Her symptoms have progressed despite medications, activity modification, etc. Her history and examination consistent with advanced degenerative joint disease as well as with impingement/tendinopathy. The patient is status post a prior rotator cuff repair. All of these findings were confirmed by preoperative MRI scan. The patient presents at this time for a reverse left total shoulder arthroplasty.  Procedure:   The patient was brought into the operating room and lain in the supine position. The patient underwent general endotracheal intubation and anesthesia before she was repositioned in the beach chair position using the beach chair positioner. The left shoulder and upper extremity were prepped with ChloraPrep solution before being draped sterilely. Preoperative antibiotics were administered.  Given her history of low platelet counts, one gram of TXA was administered IV at this point.  A standard anterior approach to the shoulder was made through an approximately 4-5 inch incision. The incision was carried down  through the subcutaneous tissues to expose the deltopectoral fascia. The interval between the deltoid and pectoralis muscles was identified and this plane developed, retracting the cephalic vein laterally with the deltoid muscle. The conjoined tendon was identified. Its lateral margin was dissected and the Kolbel self-retraining retractor inserted. The "three sisters" were identified and cauterized. Bursal tissues were removed to improve visualization. The subscapularis tendon was released from its attachment to the lesser tuberosity 1 cm proximal to its insertion and several tagging sutures placed. The inferior capsule was released with care after identifying and protecting the axillary nerve. The proximal humeral cut was made at approximately 25 of retroversion using the extra-medullary guide.   Attention was redirected to the glenoid. The labrum was debrided circumferentially before the center of the glenoid was marked with electrocautery. The guidewire was drilled into the glenoid neck using the appropriate guide. After verifying its position, it was overreamed with the mini-baseplate reamer to create a flat surface. The permanent mini-baseplate was impacted into place. It was stabilized with a 30 x 6.5 mm central screw and four peripheral screws. Locking screws were placed superiorly, inferiorly, and posteriorly while a nonlocking screw was placed anteriorly. The permanent 36 mm glenosphere was then impacted into place and its Morse taper locking mechanism verified using manual distraction.  Attention was directed to the humeral side. The humeral canal was reamed sequentially beginning with the end-cutting reamer then progressing from a 4 mm reamer up to a 10 mm reamer. This provided excellent circumferential chatter. The canal was broached beginning with a #8 broach and progressing to a #10 broach. This was  left in place and a trial reduction performed using the standard trial humeral platform. The arm  demonstrated excellent range of motion as the hand could be brought across the chest to the opposite shoulder and brought to the top of the patient's head and to the patient's ear. The shoulder appeared stable throughout this range of motion. The joint was dislocated and the trial components removed. The permanent #10 micro-stem was impacted into place with care taken to maintain the appropriate version. The permanent 40 mm humeral platform with the standard insert was put together on the back table and impacted into place. Again, the [facility] taper locking mechanism was verified using manual distraction. The shoulder was relocated using two finger pressure and again placed through a range of motion with the findings as described above.  The wound was copiously irrigated with sterile saline solution using the jet lavage system before a total of 30 cc of 0.5% Sensorcaine with epinephrine was injected into the pericapsular and peri-incisional tissues to help with postoperative analgesia. The subscapularis tendon was reapproximated using #2 FiberWire interrupted sutures. The deltopectoral interval was closed using #0 Vicryl interrupted sutures before the subcutaneous tissues were closed using 2-0 Vicryl interrupted sutures. The skin was closed using staples. Prior to closing the skin, 1 g of transexemic acid in 10 cc of normal saline was injected intra-articularly to help with postoperative bleeding. A sterile occlusive dressing was applied to the wound before the arm was placed into a shoulder immobilizer with an abduction pillow. A Polar Care system also was applied to the shoulder. The patient was then transferred back to a hospital bed before being awakened, extubated, and returned to the recovery room in satisfactory condition after tolerating the procedure well.

## 2020-06-26 ENCOUNTER — Encounter: Payer: Self-pay | Admitting: Surgery

## 2020-06-28 LAB — SURGICAL PATHOLOGY

## 2020-07-03 DIAGNOSIS — M5030 Other cervical disc degeneration, unspecified cervical region: Secondary | ICD-10-CM | POA: Diagnosis not present

## 2020-07-03 DIAGNOSIS — Z471 Aftercare following joint replacement surgery: Secondary | ICD-10-CM | POA: Diagnosis not present

## 2020-07-03 DIAGNOSIS — M19041 Primary osteoarthritis, right hand: Secondary | ICD-10-CM | POA: Diagnosis not present

## 2020-07-03 DIAGNOSIS — I10 Essential (primary) hypertension: Secondary | ICD-10-CM | POA: Diagnosis not present

## 2020-07-03 DIAGNOSIS — M5136 Other intervertebral disc degeneration, lumbar region: Secondary | ICD-10-CM | POA: Diagnosis not present

## 2020-07-03 DIAGNOSIS — Z96612 Presence of left artificial shoulder joint: Secondary | ICD-10-CM | POA: Diagnosis not present

## 2020-07-03 DIAGNOSIS — I4891 Unspecified atrial fibrillation: Secondary | ICD-10-CM | POA: Diagnosis not present

## 2020-07-03 DIAGNOSIS — M19042 Primary osteoarthritis, left hand: Secondary | ICD-10-CM | POA: Diagnosis not present

## 2020-07-03 DIAGNOSIS — F0390 Unspecified dementia without behavioral disturbance: Secondary | ICD-10-CM | POA: Diagnosis not present

## 2020-07-04 DIAGNOSIS — E039 Hypothyroidism, unspecified: Secondary | ICD-10-CM | POA: Diagnosis not present

## 2020-07-04 DIAGNOSIS — Z23 Encounter for immunization: Secondary | ICD-10-CM | POA: Diagnosis not present

## 2020-07-04 DIAGNOSIS — E7800 Pure hypercholesterolemia, unspecified: Secondary | ICD-10-CM | POA: Diagnosis not present

## 2020-07-04 DIAGNOSIS — I10 Essential (primary) hypertension: Secondary | ICD-10-CM | POA: Diagnosis not present

## 2020-07-04 DIAGNOSIS — Z79899 Other long term (current) drug therapy: Secondary | ICD-10-CM | POA: Diagnosis not present

## 2020-07-05 DIAGNOSIS — Z96612 Presence of left artificial shoulder joint: Secondary | ICD-10-CM | POA: Diagnosis not present

## 2020-07-05 DIAGNOSIS — M5136 Other intervertebral disc degeneration, lumbar region: Secondary | ICD-10-CM | POA: Diagnosis not present

## 2020-07-05 DIAGNOSIS — I4891 Unspecified atrial fibrillation: Secondary | ICD-10-CM | POA: Diagnosis not present

## 2020-07-05 DIAGNOSIS — I10 Essential (primary) hypertension: Secondary | ICD-10-CM | POA: Diagnosis not present

## 2020-07-05 DIAGNOSIS — Z471 Aftercare following joint replacement surgery: Secondary | ICD-10-CM | POA: Diagnosis not present

## 2020-07-05 DIAGNOSIS — M5030 Other cervical disc degeneration, unspecified cervical region: Secondary | ICD-10-CM | POA: Diagnosis not present

## 2020-07-05 DIAGNOSIS — F0390 Unspecified dementia without behavioral disturbance: Secondary | ICD-10-CM | POA: Diagnosis not present

## 2020-07-05 DIAGNOSIS — M19042 Primary osteoarthritis, left hand: Secondary | ICD-10-CM | POA: Diagnosis not present

## 2020-07-05 DIAGNOSIS — M19041 Primary osteoarthritis, right hand: Secondary | ICD-10-CM | POA: Diagnosis not present

## 2020-07-05 NOTE — Progress Notes (Signed)
Ariana [name]  Telephone:(336) [phone]  Fax:(336) [phone]     Ariana [name] DOB: 1933-06-04  MR#: [id number]  RKY#:[id number]  Patient Care Team: Idelle Crouch, MD as PCP - General (Internal Medicine) Lloyd Huger, MD as Consulting Physician (Oncology)   CHIEF COMPLAINT: Chronic refractory ITP  INTERVAL HISTORY: Patient returns to clinic today for routine evaluation and continuation of Nplate.  She has left shoulder reconstruction approximately 2 weeks ago.  She continues to have pain, but her husband reports she refuses to do the required rehab to help improve range of motion.  She otherwise feels well.  She continues to have chronic weakness and fatigue.  She continues to have a poor memory.  She denies any further falls. She has no neurologic complaints.  She denies any recent fevers or illnesses.  She denies any chest pain, shortness of breath, cough, or hemoptysis.  She denies any nausea, vomiting, constipation, or diarrhea.  She has no melena or hematochezia.  She has no urinary complaints.  Patient offers no further specific complaints today.  REVIEW OF SYSTEMS:   Review of Systems  Constitutional: Positive for malaise/fatigue. Negative for fever and weight loss.  HENT: Negative for congestion.   Respiratory: Negative.  Negative for cough, hemoptysis and shortness of breath.   Cardiovascular: Negative.  Negative for chest pain and leg swelling.  Gastrointestinal: Negative.  Negative for abdominal pain, blood in stool, diarrhea, melena and nausea.  Genitourinary: Negative.  Negative for dysuria and hematuria.  Musculoskeletal: Positive for joint pain. Negative for back pain and falls.  Skin: Negative.  Negative for rash.  Neurological: Positive for weakness. Negative for dizziness, sensory change, focal weakness and headaches.  Endo/Heme/Allergies: Does not bruise/bleed easily.  Psychiatric/Behavioral: Positive for memory loss. Negative for  depression. The patient is not nervous/anxious.     As per HPI. Otherwise, a complete review of systems is negative.  PAST MEDICAL HISTORY: Past Medical History:  Diagnosis Date  . Alzheimer disease ([place])   . Anemia   . Anxiety   . Asthma    WELL CONTROLLED  . Chronic back pain   . Depression   . Depression   . GERD (gastroesophageal reflux disease)   . Hiatal hernia   . Hypercholesteremia   . Hypothyroidism   . ITP (idiopathic thrombocytopenic purpura)    FOLLOWED BR DR Grayland Ormond  . LBBB (left bundle branch block)   . Osteoarthritis   . Osteoporosis   . Restless legs     PAST SURGICAL HISTORY: Past Surgical History:  Procedure Laterality Date  . CARPAL TUNNEL RELEASE    . ESOPHAGOGASTRODUODENOSCOPY (EGD) WITH PROPOFOL N/A 09/10/2015   Procedure: ESOPHAGOGASTRODUODENOSCOPY (EGD) WITH PROPOFOL;  Surgeon: Lollie Sails, MD;  Location: [facility] ENDOSCOPY;  Service: Endoscopy;  Laterality: N/A;  . IRRIGATION AND DEBRIDEMENT HEMATOMA Right 07/13/2018   Procedure: IRRIGATION AND DEBRIDEMENT HEMATOMA-RIGHT SHIN;  Surgeon: Herbert Pun, MD;  Location: [facility] ORS;  Service: General;  Laterality: Right;  . KYPHOPLASTY N/A 05/02/2019   Procedure: L1 KYPHOPLASTY;  Surgeon: Hessie Knows, MD;  Location: [facility] ORS;  Service: Orthopedics;  Laterality: N/A;  . LUMBAR [place]    . REVERSE SHOULDER ARTHROPLASTY Left 06/25/2020   Procedure: REVERSE SHOULDER ARTHROPLASTY;  Surgeon: Corky Mull, MD;  Location: [facility] ORS;  Service: Orthopedics;  Laterality: Left;  . SPLENECTOMY, PARTIAL    . TOTAL ABDOMINAL HYSTERECTOMY      FAMILY HISTORY Family History  Problem Relation Age of Onset  . Stroke Mother  GYNECOLOGIC HISTORY:  No LMP recorded. Patient is postmenopausal.     ADVANCED DIRECTIVES:    HEALTH MAINTENANCE: Social History   Tobacco Use  . Smoking status: Never Smoker  . Smokeless tobacco: Never Used  Vaping Use  . Vaping Use: Never used  Substance Use  Topics  . Alcohol use: No    Alcohol/week: 0.0 standard drinks  . Drug use: No     Allergies  Allergen Reactions  . Aspirin     Upset stomach  . Diazepam Itching    sneezing  . Other     seasonal allergies  . Tetanus Toxoids     Localized superficial swelling of skin  . Morphine Itching and Rash    Current Outpatient Medications  Medication Sig Dispense Refill  . acetaminophen (TYLENOL) 325 MG tablet Take 2 tablets (650 mg total) by mouth every 6 (six) hours as needed for mild pain (or Fever >/= 101). 100 tablet 0  . albuterol (PROVENTIL HFA;VENTOLIN HFA) 108 (90 BASE) MCG/ACT inhaler Inhale 2 puffs into the lungs every 6 (six) hours as needed for wheezing or shortness of breath.    . ALPRAZolam (XANAX) 0.5 MG tablet Take 1 tablet (0.5 mg total) by mouth 3 (three) times daily as needed for anxiety. 20 tablet 0  . buPROPion (WELLBUTRIN XL) 150 MG 24 hr tablet Take 150 mg by mouth every morning.     . busPIRone (BUSPAR) 15 MG tablet Take 15 mg by mouth 2 (two) times daily.     . Calcium Carbonate-Vit D-Min (CALCIUM 600+D PLUS MINERALS) 600-400 MG-UNIT TABS Take 1 tablet by mouth daily.     . Carboxymethylcellul-Glycerin (LUBRICATING EYE DROPS OP) Place 1 drop into both eyes daily as needed (dry eyes).    . Cholecalciferol (VITAMIN D) 50 MCG (2000 UT) tablet Take 2,000 Units by mouth daily.     . citalopram (CELEXA) 40 MG tablet Take 40 mg by mouth every morning.     . Cyanocobalamin (RA VITAMIN B-12 TR) 1000 MCG TBCR Take 1,000 mcg by mouth daily.     Marland Kitchen donepezil (ARICEPT) 10 MG tablet Take 10 mg by mouth at bedtime.     . feeding supplement, ENSURE ENLIVE, (ENSURE ENLIVE) LIQD Take 237 mLs by mouth 2 (two) times daily between meals. (Patient taking differently: Take 237 mLs by mouth daily with breakfast. ) 237 mL 12  . fluticasone (FLONASE) 50 MCG/ACT nasal spray Place 2 sprays into both nostrils daily as needed for allergies.     . Fluticasone-Salmeterol (ADVAIR) 250-50 MCG/DOSE  AEPB Inhale 1 puff into the lungs 2 (two) times daily as needed (shortness of breath).     . gabapentin (NEURONTIN) 300 MG capsule Take 300 mg by mouth 4 (four) times daily.    Marland Kitchen levothyroxine (SYNTHROID, LEVOTHROID) 50 MCG tablet Take 50 mcg by mouth daily before breakfast. Take 30 to 60 minutes before breakfast.    . memantine (NAMENDA) 10 MG tablet Take 10 mg by mouth 2 (two) times daily.     . montelukast (SINGULAIR) 10 MG tablet Take 10 mg by mouth at bedtime.     . Multiple Vitamin (MULTI-VITAMIN) tablet Take 1 tablet by mouth daily.     . Multiple Vitamins-Minerals (PRESERVISION AREDS 2) CAPS Take 1 capsule by mouth 2 (two) times a day.    Marland Kitchen NARCAN 4 MG/0.1ML LIQD nasal spray kit Place 1 spray into the nose once.     Marland Kitchen oxybutynin (DITROPAN) 5 MG tablet Take 5 mg by  mouth 3 (three) times daily.    Marland Kitchen oxyCODONE (ROXICODONE) 5 MG immediate release tablet Take 1 tablet (5 mg total) by mouth every 4 (four) hours as needed for breakthrough pain. 40 tablet 0  . oxyCODONE-acetaminophen (PERCOCET) 10-325 MG tablet Take 1 tablet by mouth every 6 (six) hours as needed for moderate pain or severe pain.    . pantoprazole (PROTONIX) 20 MG tablet Take 20 mg by mouth 2 (two) times daily.     . pravastatin (PRAVACHOL) 20 MG tablet Take 20 mg by mouth at bedtime.     . vitamin C (ASCORBIC ACID) 500 MG tablet Take 500 mg by mouth daily.    . vitamin E (E-400) 400 UNIT capsule Take 400 Units by mouth daily.     No current facility-administered medications for this visit.    OBJECTIVE: BP (!) 146/68 (BP Location: Right Arm, Patient Position: Sitting, Cuff Size: Normal)   Pulse 60   Temp (!) 97.5 F (36.4 C) (Tympanic)   Resp 20   Ht _0  (1.676 m)   Wt 127 lb 6.4 oz (57.8 kg)   SpO2 100%   BMI 20.56 kg/m    Body mass index is 20.56 kg/m.    ECOG FS:1 - Symptomatic but completely ambulatory  General: Thin, no acute distress. Eyes: Pink conjunctiva, anicteric sclera. HEENT: Normocephalic, moist  mucous membranes. Lungs: No audible wheezing or coughing. Heart: Regular rate and rhythm. Abdomen: Soft, nontender, no obvious distention. Musculoskeletal: No edema, cyanosis, or clubbing.  Left arm in sling. Neuro: Alert, answering all questions appropriately. Cranial nerves grossly intact. Skin: No rashes or petechiae noted. Psych: Normal affect.   LAB RESULTS:  Appointment on 07/10/2020  Component Date Value Ref Range Status  . WBC 07/10/2020 12.2* 4.0 - 10.5 K/uL Final  . RBC 07/10/2020 3.53* 3.87 - 5.11 MIL/uL Final  . Hemoglobin 07/10/2020 10.2* 12.0 - 15.0 g/dL Final  . HCT 07/10/2020 31.4* 36 - 46 % Final  . MCV 07/10/2020 89.0  80.0 - 100.0 fL Final  . MCH 07/10/2020 28.9  26.0 - 34.0 pg Final  . MCHC 07/10/2020 32.5  30.0 - 36.0 g/dL Final  . RDW 07/10/2020 17.3* 11.5 - 15.5 % Final  . Platelets 07/10/2020 51* 150 - 400 K/uL Final   Comment: Immature Platelet Fraction may be clinically indicated, consider ordering this additional test [id number]   . nRBC 07/10/2020 0.0  0.0 - 0.2 % Final  . Neutrophils Relative % 07/10/2020 63  % Final  . Neutro Abs 07/10/2020 7.7  1.7 - 7.7 K/uL Final  . Lymphocytes Relative 07/10/2020 26  % Final  . Lymphs Abs 07/10/2020 3.2  0.7 - 4.0 K/uL Final  . Monocytes Relative 07/10/2020 8  % Final  . Monocytes Absolute 07/10/2020 1.0  0 - 1 K/uL Final  . Eosinophils Relative 07/10/2020 2  % Final  . Eosinophils Absolute 07/10/2020 0.2  0 - 0 K/uL Final  . Basophils Relative 07/10/2020 1  % Final  . Basophils Absolute 07/10/2020 0.1  0 - 0 K/uL Final  . Immature Granulocytes 07/10/2020 0  % Final  . Abs Immature Granulocytes 07/10/2020 0.05  0.00 - 0.07 K/uL Final   Performed at [facility], [address] [place], [place] [address]    STUDIES: No results found.  ASSESSMENT:  Chronic refractory ITP  PLAN:   1. Chronic refractory ITP: Patient had a poor response to Prednisone, WinRho, and Rituxan. She is status post  splenectomy in 2007.  She received IVIG and Nplate she received while at [facility] with significant improvement of her platelets.  If Nplate no longer was effective, could retry IVIG at a future date.  Patient is currently receiving the max dose of Nplate at 10 mcg/kg.  Can also consider Promacta 12.16m or Tavalisse 100 mg BID if needed.  Patient platelet count is 51 today, therefore proceed with treatment as planned.  Return to clinic in 4 weeks for repeat laboratory work, further evaluation, and continuation of Nplate.  2. Weakness and fatigue: Chronic and unchanged. 3.  Depression/grief: Improved.  Secondary to the recent death of her son.  Monitor.  Appreciate palliative care input.   4.  Poor appetite/weight loss: Resolved.  Patient is now gaining weight. 5.  Dementia: Chronic and unchanged.  Patient is now at [employer] [facility].  Leukocytosis: White count is trending down now to 12.2.  Monitor. 7.  Anemia: Patient's hemoglobin dropped the time of her surgery, but now is improving.  Monitor. 8.  Left shoulder pain: Secondary to recent shoulder reconstruction.  Have instructed patient to continue rehab exercises as directed.   Patient expressed understanding and was in agreement with this plan. She also understands that She can call clinic at any time with any questions, concerns, or complaints.    TLloyd Huger MD   07/11/2020 6:39 AM

## 2020-07-08 DIAGNOSIS — M19042 Primary osteoarthritis, left hand: Secondary | ICD-10-CM | POA: Diagnosis not present

## 2020-07-08 DIAGNOSIS — F0390 Unspecified dementia without behavioral disturbance: Secondary | ICD-10-CM | POA: Diagnosis not present

## 2020-07-08 DIAGNOSIS — Z471 Aftercare following joint replacement surgery: Secondary | ICD-10-CM | POA: Diagnosis not present

## 2020-07-08 DIAGNOSIS — M5030 Other cervical disc degeneration, unspecified cervical region: Secondary | ICD-10-CM | POA: Diagnosis not present

## 2020-07-08 DIAGNOSIS — I4891 Unspecified atrial fibrillation: Secondary | ICD-10-CM | POA: Diagnosis not present

## 2020-07-08 DIAGNOSIS — I10 Essential (primary) hypertension: Secondary | ICD-10-CM | POA: Diagnosis not present

## 2020-07-08 DIAGNOSIS — M19041 Primary osteoarthritis, right hand: Secondary | ICD-10-CM | POA: Diagnosis not present

## 2020-07-08 DIAGNOSIS — M5136 Other intervertebral disc degeneration, lumbar region: Secondary | ICD-10-CM | POA: Diagnosis not present

## 2020-07-08 DIAGNOSIS — Z96612 Presence of left artificial shoulder joint: Secondary | ICD-10-CM | POA: Diagnosis not present

## 2020-07-09 ENCOUNTER — Encounter: Payer: Self-pay | Admitting: Oncology

## 2020-07-09 DIAGNOSIS — Z96612 Presence of left artificial shoulder joint: Secondary | ICD-10-CM | POA: Diagnosis not present

## 2020-07-09 DIAGNOSIS — I10 Essential (primary) hypertension: Secondary | ICD-10-CM | POA: Diagnosis not present

## 2020-07-09 DIAGNOSIS — M19041 Primary osteoarthritis, right hand: Secondary | ICD-10-CM | POA: Diagnosis not present

## 2020-07-09 DIAGNOSIS — F0390 Unspecified dementia without behavioral disturbance: Secondary | ICD-10-CM | POA: Diagnosis not present

## 2020-07-09 DIAGNOSIS — M19042 Primary osteoarthritis, left hand: Secondary | ICD-10-CM | POA: Diagnosis not present

## 2020-07-09 DIAGNOSIS — Z471 Aftercare following joint replacement surgery: Secondary | ICD-10-CM | POA: Diagnosis not present

## 2020-07-09 DIAGNOSIS — I4891 Unspecified atrial fibrillation: Secondary | ICD-10-CM | POA: Diagnosis not present

## 2020-07-09 DIAGNOSIS — M5136 Other intervertebral disc degeneration, lumbar region: Secondary | ICD-10-CM | POA: Diagnosis not present

## 2020-07-09 DIAGNOSIS — M5030 Other cervical disc degeneration, unspecified cervical region: Secondary | ICD-10-CM | POA: Diagnosis not present

## 2020-07-09 NOTE — Progress Notes (Signed)
Spoke to patient's husband , he states patient recently had left shoulder surgery and is having some pain from that. Rates pain today at 10. States this afternoon having clamps removed and may help with pain.

## 2020-07-10 ENCOUNTER — Inpatient Hospital Stay: Payer: Medicare HMO

## 2020-07-10 ENCOUNTER — Other Ambulatory Visit: Payer: Self-pay

## 2020-07-10 ENCOUNTER — Inpatient Hospital Stay: Payer: Medicare HMO | Admitting: Oncology

## 2020-07-10 ENCOUNTER — Encounter: Payer: Self-pay | Admitting: Oncology

## 2020-07-10 VITALS — BP 146/68 | HR 60 | Temp 97.5°F | Resp 20 | Ht 66.0 in | Wt 127.4 lb

## 2020-07-10 DIAGNOSIS — D693 Immune thrombocytopenic purpura: Secondary | ICD-10-CM

## 2020-07-10 LAB — CBC WITH DIFFERENTIAL/PLATELET
Abs Immature Granulocytes: 0.05 10*3/uL (ref 0.00–0.07)
Basophils Absolute: 0.1 10*3/uL (ref 0.0–0.1)
Basophils Relative: 1 %
Eosinophils Absolute: 0.2 10*3/uL (ref 0.0–0.5)
Eosinophils Relative: 2 %
HCT: 31.4 % — ABNORMAL LOW (ref 36.0–46.0)
Hemoglobin: 10.2 g/dL — ABNORMAL LOW (ref 12.0–15.0)
Immature Granulocytes: 0 %
Lymphocytes Relative: 26 %
Lymphs Abs: 3.2 10*3/uL (ref 0.7–4.0)
MCH: 28.9 pg (ref 26.0–34.0)
MCHC: 32.5 g/dL (ref 30.0–36.0)
MCV: 89 fL (ref 80.0–100.0)
Monocytes Absolute: 1 10*3/uL (ref 0.1–1.0)
Monocytes Relative: 8 %
Neutro Abs: 7.7 10*3/uL (ref 1.7–7.7)
Neutrophils Relative %: 63 %
Platelets: 51 10*3/uL — ABNORMAL LOW (ref 150–400)
RBC: 3.53 MIL/uL — ABNORMAL LOW (ref 3.87–5.11)
RDW: 17.3 % — ABNORMAL HIGH (ref 11.5–15.5)
WBC: 12.2 10*3/uL — ABNORMAL HIGH (ref 4.0–10.5)
nRBC: 0 % (ref 0.0–0.2)

## 2020-07-10 MED ORDER — ROMIPLOSTIM INJECTION 500 MCG
10.0000 ug/kg | Freq: Once | SUBCUTANEOUS | Status: AC
  Administered 2020-07-10: 580 ug via SUBCUTANEOUS
  Filled 2020-07-10: qty 1.16

## 2020-07-12 DIAGNOSIS — M5030 Other cervical disc degeneration, unspecified cervical region: Secondary | ICD-10-CM | POA: Diagnosis not present

## 2020-07-12 DIAGNOSIS — I10 Essential (primary) hypertension: Secondary | ICD-10-CM | POA: Diagnosis not present

## 2020-07-12 DIAGNOSIS — M19041 Primary osteoarthritis, right hand: Secondary | ICD-10-CM | POA: Diagnosis not present

## 2020-07-12 DIAGNOSIS — I4891 Unspecified atrial fibrillation: Secondary | ICD-10-CM | POA: Diagnosis not present

## 2020-07-12 DIAGNOSIS — Z471 Aftercare following joint replacement surgery: Secondary | ICD-10-CM | POA: Diagnosis not present

## 2020-07-12 DIAGNOSIS — M19042 Primary osteoarthritis, left hand: Secondary | ICD-10-CM | POA: Diagnosis not present

## 2020-07-12 DIAGNOSIS — Z96612 Presence of left artificial shoulder joint: Secondary | ICD-10-CM | POA: Diagnosis not present

## 2020-07-12 DIAGNOSIS — M5136 Other intervertebral disc degeneration, lumbar region: Secondary | ICD-10-CM | POA: Diagnosis not present

## 2020-07-12 DIAGNOSIS — F0390 Unspecified dementia without behavioral disturbance: Secondary | ICD-10-CM | POA: Diagnosis not present

## 2020-07-15 DIAGNOSIS — J4521 Mild intermittent asthma with (acute) exacerbation: Secondary | ICD-10-CM | POA: Diagnosis not present

## 2020-07-15 DIAGNOSIS — Z03818 Encounter for observation for suspected exposure to other biological agents ruled out: Secondary | ICD-10-CM | POA: Diagnosis not present

## 2020-07-15 DIAGNOSIS — R053 Chronic cough: Secondary | ICD-10-CM | POA: Diagnosis not present

## 2020-07-15 DIAGNOSIS — R059 Cough, unspecified: Secondary | ICD-10-CM | POA: Diagnosis not present

## 2020-07-17 DIAGNOSIS — I10 Essential (primary) hypertension: Secondary | ICD-10-CM | POA: Diagnosis not present

## 2020-07-17 DIAGNOSIS — Z471 Aftercare following joint replacement surgery: Secondary | ICD-10-CM | POA: Diagnosis not present

## 2020-07-17 DIAGNOSIS — M5030 Other cervical disc degeneration, unspecified cervical region: Secondary | ICD-10-CM | POA: Diagnosis not present

## 2020-07-17 DIAGNOSIS — F0390 Unspecified dementia without behavioral disturbance: Secondary | ICD-10-CM | POA: Diagnosis not present

## 2020-07-17 DIAGNOSIS — M19042 Primary osteoarthritis, left hand: Secondary | ICD-10-CM | POA: Diagnosis not present

## 2020-07-17 DIAGNOSIS — Z96612 Presence of left artificial shoulder joint: Secondary | ICD-10-CM | POA: Diagnosis not present

## 2020-07-17 DIAGNOSIS — M5136 Other intervertebral disc degeneration, lumbar region: Secondary | ICD-10-CM | POA: Diagnosis not present

## 2020-07-17 DIAGNOSIS — I4891 Unspecified atrial fibrillation: Secondary | ICD-10-CM | POA: Diagnosis not present

## 2020-07-17 DIAGNOSIS — M19041 Primary osteoarthritis, right hand: Secondary | ICD-10-CM | POA: Diagnosis not present

## 2020-07-18 ENCOUNTER — Inpatient Hospital Stay: Payer: Medicare HMO | Attending: Oncology

## 2020-07-18 ENCOUNTER — Other Ambulatory Visit: Payer: Self-pay

## 2020-07-18 DIAGNOSIS — M19041 Primary osteoarthritis, right hand: Secondary | ICD-10-CM | POA: Diagnosis not present

## 2020-07-18 DIAGNOSIS — F0390 Unspecified dementia without behavioral disturbance: Secondary | ICD-10-CM | POA: Diagnosis not present

## 2020-07-18 DIAGNOSIS — I4891 Unspecified atrial fibrillation: Secondary | ICD-10-CM | POA: Diagnosis not present

## 2020-07-18 DIAGNOSIS — I10 Essential (primary) hypertension: Secondary | ICD-10-CM | POA: Diagnosis not present

## 2020-07-18 DIAGNOSIS — M19042 Primary osteoarthritis, left hand: Secondary | ICD-10-CM | POA: Diagnosis not present

## 2020-07-18 DIAGNOSIS — Z96612 Presence of left artificial shoulder joint: Secondary | ICD-10-CM | POA: Diagnosis not present

## 2020-07-18 DIAGNOSIS — Z471 Aftercare following joint replacement surgery: Secondary | ICD-10-CM | POA: Diagnosis not present

## 2020-07-18 DIAGNOSIS — M5136 Other intervertebral disc degeneration, lumbar region: Secondary | ICD-10-CM | POA: Diagnosis not present

## 2020-07-18 DIAGNOSIS — M5030 Other cervical disc degeneration, unspecified cervical region: Secondary | ICD-10-CM | POA: Diagnosis not present

## 2020-07-18 DIAGNOSIS — D693 Immune thrombocytopenic purpura: Secondary | ICD-10-CM | POA: Insufficient documentation

## 2020-07-18 NOTE — Progress Notes (Signed)
Nutrition Follow-up:  Patient with idiopathic thrombocytopenic purpura.  Followed by Dr. Grayland [name] and Palliative care.    Spoke with husband via phone for nutrition follow-up.  Husband reports that appetite has been decreased due to shoulder surgery that occurred 2 weeks ago.  Reports that pain is effecting appetite.  States that pain is being addressed by MD.  Drinks carnation instant breakfast BID.  Husband encourages patient to eat frequently during the day.  Patient is working with PT.      Medications: reviewed  Labs: reviewed  Anthropometrics:   Weight decreased slightly to 127 lb 6.4 oz on 9/29 from 129 lb 14.4 oz on 8/4.    UBW of 136 lb   NUTRITION DIAGNOSIS: Inadequate oral intake stable   INTERVENTION:  Encouraged oral nutrition supplements Encouraged small frequent meals Husband denies questions or concerns about nutrition at this time.   Husband has contact information and will reach out to RD in the future if needed.     NEXT VISIT: no follow-up  Ariana [name] B. Zenia Resides, [place], [place] Registered Dietitian [phone] (mobile)

## 2020-07-19 DIAGNOSIS — I4891 Unspecified atrial fibrillation: Secondary | ICD-10-CM | POA: Diagnosis not present

## 2020-07-19 DIAGNOSIS — M19041 Primary osteoarthritis, right hand: Secondary | ICD-10-CM | POA: Diagnosis not present

## 2020-07-19 DIAGNOSIS — M19042 Primary osteoarthritis, left hand: Secondary | ICD-10-CM | POA: Diagnosis not present

## 2020-07-19 DIAGNOSIS — M5136 Other intervertebral disc degeneration, lumbar region: Secondary | ICD-10-CM | POA: Diagnosis not present

## 2020-07-19 DIAGNOSIS — Z96612 Presence of left artificial shoulder joint: Secondary | ICD-10-CM | POA: Diagnosis not present

## 2020-07-19 DIAGNOSIS — F0390 Unspecified dementia without behavioral disturbance: Secondary | ICD-10-CM | POA: Diagnosis not present

## 2020-07-19 DIAGNOSIS — I10 Essential (primary) hypertension: Secondary | ICD-10-CM | POA: Diagnosis not present

## 2020-07-19 DIAGNOSIS — M5030 Other cervical disc degeneration, unspecified cervical region: Secondary | ICD-10-CM | POA: Diagnosis not present

## 2020-07-19 DIAGNOSIS — Z471 Aftercare following joint replacement surgery: Secondary | ICD-10-CM | POA: Diagnosis not present

## 2020-07-22 DIAGNOSIS — Z471 Aftercare following joint replacement surgery: Secondary | ICD-10-CM | POA: Diagnosis not present

## 2020-07-22 DIAGNOSIS — F0390 Unspecified dementia without behavioral disturbance: Secondary | ICD-10-CM | POA: Diagnosis not present

## 2020-07-22 DIAGNOSIS — Z96612 Presence of left artificial shoulder joint: Secondary | ICD-10-CM | POA: Diagnosis not present

## 2020-07-22 DIAGNOSIS — M5030 Other cervical disc degeneration, unspecified cervical region: Secondary | ICD-10-CM | POA: Diagnosis not present

## 2020-07-22 DIAGNOSIS — I4891 Unspecified atrial fibrillation: Secondary | ICD-10-CM | POA: Diagnosis not present

## 2020-07-22 DIAGNOSIS — M19041 Primary osteoarthritis, right hand: Secondary | ICD-10-CM | POA: Diagnosis not present

## 2020-07-22 DIAGNOSIS — I10 Essential (primary) hypertension: Secondary | ICD-10-CM | POA: Diagnosis not present

## 2020-07-22 DIAGNOSIS — M19042 Primary osteoarthritis, left hand: Secondary | ICD-10-CM | POA: Diagnosis not present

## 2020-07-22 DIAGNOSIS — M5136 Other intervertebral disc degeneration, lumbar region: Secondary | ICD-10-CM | POA: Diagnosis not present

## 2020-07-23 DIAGNOSIS — I4891 Unspecified atrial fibrillation: Secondary | ICD-10-CM | POA: Diagnosis not present

## 2020-07-23 DIAGNOSIS — I10 Essential (primary) hypertension: Secondary | ICD-10-CM | POA: Diagnosis not present

## 2020-07-23 DIAGNOSIS — Z471 Aftercare following joint replacement surgery: Secondary | ICD-10-CM | POA: Diagnosis not present

## 2020-07-23 DIAGNOSIS — M5030 Other cervical disc degeneration, unspecified cervical region: Secondary | ICD-10-CM | POA: Diagnosis not present

## 2020-07-23 DIAGNOSIS — M19042 Primary osteoarthritis, left hand: Secondary | ICD-10-CM | POA: Diagnosis not present

## 2020-07-23 DIAGNOSIS — F0390 Unspecified dementia without behavioral disturbance: Secondary | ICD-10-CM | POA: Diagnosis not present

## 2020-07-23 DIAGNOSIS — M19041 Primary osteoarthritis, right hand: Secondary | ICD-10-CM | POA: Diagnosis not present

## 2020-07-23 DIAGNOSIS — Z96612 Presence of left artificial shoulder joint: Secondary | ICD-10-CM | POA: Diagnosis not present

## 2020-07-23 DIAGNOSIS — M5136 Other intervertebral disc degeneration, lumbar region: Secondary | ICD-10-CM | POA: Diagnosis not present

## 2020-07-24 DIAGNOSIS — M19042 Primary osteoarthritis, left hand: Secondary | ICD-10-CM | POA: Diagnosis not present

## 2020-07-24 DIAGNOSIS — M19041 Primary osteoarthritis, right hand: Secondary | ICD-10-CM | POA: Diagnosis not present

## 2020-07-24 DIAGNOSIS — M5136 Other intervertebral disc degeneration, lumbar region: Secondary | ICD-10-CM | POA: Diagnosis not present

## 2020-07-24 DIAGNOSIS — I4891 Unspecified atrial fibrillation: Secondary | ICD-10-CM | POA: Diagnosis not present

## 2020-07-24 DIAGNOSIS — I10 Essential (primary) hypertension: Secondary | ICD-10-CM | POA: Diagnosis not present

## 2020-07-24 DIAGNOSIS — M5030 Other cervical disc degeneration, unspecified cervical region: Secondary | ICD-10-CM | POA: Diagnosis not present

## 2020-07-24 DIAGNOSIS — Z471 Aftercare following joint replacement surgery: Secondary | ICD-10-CM | POA: Diagnosis not present

## 2020-07-24 DIAGNOSIS — Z96612 Presence of left artificial shoulder joint: Secondary | ICD-10-CM | POA: Diagnosis not present

## 2020-07-24 DIAGNOSIS — F0390 Unspecified dementia without behavioral disturbance: Secondary | ICD-10-CM | POA: Diagnosis not present

## 2020-07-30 DIAGNOSIS — Z471 Aftercare following joint replacement surgery: Secondary | ICD-10-CM | POA: Diagnosis not present

## 2020-07-30 DIAGNOSIS — M5136 Other intervertebral disc degeneration, lumbar region: Secondary | ICD-10-CM | POA: Diagnosis not present

## 2020-07-30 DIAGNOSIS — M19041 Primary osteoarthritis, right hand: Secondary | ICD-10-CM | POA: Diagnosis not present

## 2020-07-30 DIAGNOSIS — M19042 Primary osteoarthritis, left hand: Secondary | ICD-10-CM | POA: Diagnosis not present

## 2020-07-30 DIAGNOSIS — M5030 Other cervical disc degeneration, unspecified cervical region: Secondary | ICD-10-CM | POA: Diagnosis not present

## 2020-07-30 DIAGNOSIS — Z96612 Presence of left artificial shoulder joint: Secondary | ICD-10-CM | POA: Diagnosis not present

## 2020-07-30 DIAGNOSIS — I4891 Unspecified atrial fibrillation: Secondary | ICD-10-CM | POA: Diagnosis not present

## 2020-07-30 DIAGNOSIS — F0390 Unspecified dementia without behavioral disturbance: Secondary | ICD-10-CM | POA: Diagnosis not present

## 2020-07-30 DIAGNOSIS — I10 Essential (primary) hypertension: Secondary | ICD-10-CM | POA: Diagnosis not present

## 2020-08-02 NOTE — Progress Notes (Signed)
[facility]  Telephone:(336) [phone]  Fax:(336) [phone]     Ariana [name] DOB: [birth year]  MR#: [id number]  OXB#:[id number]  Patient Care Team: Idelle Crouch, MD as PCP - General (Internal Medicine) Lloyd Huger, MD as Consulting Physician (Oncology)   CHIEF COMPLAINT: Chronic refractory ITP  INTERVAL HISTORY: Patient returns to clinic today for routine evaluation and continuation of Nplate.  She currently feels well and is asymptomatic.  She is fully recovered from her left shoulder reconstruction approximately 6 weeks ago.  She continues to have chronic weakness and fatigue.  She continues to have a poor memory.  She denies any further falls. She has no neurologic complaints.  She denies any recent fevers or illnesses.  She denies any chest pain, shortness of breath, cough, or hemoptysis.  She denies any nausea, vomiting, constipation, or diarrhea.  She has no melena or hematochezia.  She has no urinary complaints.  Patient offers no specific complaints today.  REVIEW OF SYSTEMS:   Review of Systems  Constitutional: Positive for malaise/fatigue. Negative for fever and weight loss.  HENT: Negative for congestion.   Respiratory: Negative.  Negative for cough, hemoptysis and shortness of breath.   Cardiovascular: Negative.  Negative for chest pain and leg swelling.  Gastrointestinal: Negative.  Negative for abdominal pain, blood in stool, diarrhea, melena and nausea.  Genitourinary: Negative.  Negative for dysuria and hematuria.  Musculoskeletal: Negative for back pain, falls and joint pain.  Skin: Negative.  Negative for rash.  Neurological: Positive for weakness. Negative for dizziness, sensory change, focal weakness and headaches.  Endo/Heme/Allergies: Does not bruise/bleed easily.  Psychiatric/Behavioral: Positive for memory loss. Negative for depression. The patient is not nervous/anxious.     As per HPI. Otherwise, a complete review of  systems is negative.  PAST MEDICAL HISTORY: Past Medical History:  Diagnosis Date  . Alzheimer disease ([place])   . Anemia   . Anxiety   . Asthma    WELL CONTROLLED  . Chronic back pain   . Depression   . Depression   . GERD (gastroesophageal reflux disease)   . Hiatal hernia   . Hypercholesteremia   . Hypothyroidism   . ITP (idiopathic thrombocytopenic purpura)    FOLLOWED BR DR Grayland Ormond  . LBBB (left bundle branch block)   . Osteoarthritis   . Osteoporosis   . Restless legs     PAST SURGICAL HISTORY: Past Surgical History:  Procedure Laterality Date  . CARPAL TUNNEL RELEASE    . ESOPHAGOGASTRODUODENOSCOPY (EGD) WITH PROPOFOL N/A 09/10/2015   Procedure: ESOPHAGOGASTRODUODENOSCOPY (EGD) WITH PROPOFOL;  Surgeon: Lollie Sails, MD;  Location[facility] ENDOSCOPY;  Service: Endoscopy;  Laterality: N/A;  . IRRIGATION AND DEBRIDEMENT HEMATOMA Right 07/13/2018   Procedure: IRRIGATION AND DEBRIDEMENT HEMATOMA-RIGHT SHIN;  Surgeon: Herbert Pun, MD;  Location: [facility] ORS;  Service: General;  Laterality: Right;  . KYPHOPLASTY N/A 05/02/2019   Procedure: L1 KYPHOPLASTY;  Surgeon: Hessie Knows, MD;  Location: [facility] ORS;  Service: Orthopedics;  Laterality: N/A;  . LUMBAR [place]    . REVERSE SHOULDER ARTHROPLASTY Left 06/25/2020   Procedure: REVERSE SHOULDER ARTHROPLASTY;  Surgeon: Corky Mull, MD;  Location: [facility] ORS;  Service: Orthopedics;  Laterality: Left;  . SPLENECTOMY, PARTIAL    . TOTAL ABDOMINAL HYSTERECTOMY      FAMILY HISTORY Family History  Problem Relation Age of Onset  . Stroke Mother     GYNECOLOGIC HISTORY:  No LMP recorded. Patient is postmenopausal.     ADVANCED DIRECTIVES:  HEALTH MAINTENANCE: Social History   Tobacco Use  . Smoking status: Never Smoker  . Smokeless tobacco: Never Used  Vaping Use  . Vaping Use: Never used  Substance Use Topics  . Alcohol use: No    Alcohol/week: 0.0 standard drinks  . Drug use: No     Allergies   Allergen Reactions  . Aspirin     Upset stomach  . Diazepam Itching    sneezing  . Other     seasonal allergies  . Tetanus Toxoids     Localized superficial swelling of skin  . Morphine Itching and Rash    Current Outpatient Medications  Medication Sig Dispense Refill  . acetaminophen (TYLENOL) 325 MG tablet Take 2 tablets (650 mg total) by mouth every 6 (six) hours as needed for mild pain (or Fever >/= 101). 100 tablet 0  . albuterol (PROVENTIL HFA;VENTOLIN HFA) 108 (90 BASE) MCG/ACT inhaler Inhale 2 puffs into the lungs every 6 (six) hours as needed for wheezing or shortness of breath.    . ALPRAZolam (XANAX) 0.5 MG tablet Take 1 tablet (0.5 mg total) by mouth 3 (three) times daily as needed for anxiety. 20 tablet 0  . buPROPion (WELLBUTRIN XL) 150 MG 24 hr tablet Take 150 mg by mouth every morning.     . busPIRone (BUSPAR) 15 MG tablet Take 15 mg by mouth 2 (two) times daily.     . Calcium Carbonate-Vit D-Min (CALCIUM 600+D PLUS MINERALS) 600-400 MG-UNIT TABS Take 1 tablet by mouth daily.     . Carboxymethylcellul-Glycerin (LUBRICATING EYE DROPS OP) Place 1 drop into both eyes daily as needed (dry eyes).    . Cholecalciferol (VITAMIN D) 50 MCG (2000 UT) tablet Take 2,000 Units by mouth daily.     . citalopram (CELEXA) 40 MG tablet Take 40 mg by mouth every morning.     . Cyanocobalamin (RA VITAMIN B-12 TR) 1000 MCG TBCR Take 1,000 mcg by mouth daily.     Marland Kitchen donepezil (ARICEPT) 10 MG tablet Take 10 mg by mouth at bedtime.     . feeding supplement, ENSURE ENLIVE, (ENSURE ENLIVE) LIQD Take 237 mLs by mouth 2 (two) times daily between meals. (Patient taking differently: Take 237 mLs by mouth daily with breakfast. ) 237 mL 12  . fluticasone (FLONASE) 50 MCG/ACT nasal spray Place 2 sprays into both nostrils daily as needed for allergies.     . Fluticasone-Salmeterol (ADVAIR) 250-50 MCG/DOSE AEPB Inhale 1 puff into the lungs 2 (two) times daily as needed (shortness of breath).     .  gabapentin (NEURONTIN) 300 MG capsule Take 300 mg by mouth 4 (four) times daily.    Marland Kitchen levothyroxine (SYNTHROID, LEVOTHROID) 50 MCG tablet Take 50 mcg by mouth daily before breakfast. Take 30 to 60 minutes before breakfast.    . memantine (NAMENDA) 10 MG tablet Take 10 mg by mouth 2 (two) times daily.     . montelukast (SINGULAIR) 10 MG tablet Take 10 mg by mouth at bedtime.     . Multiple Vitamin (MULTI-VITAMIN) tablet Take 1 tablet by mouth daily.     . Multiple Vitamins-Minerals (PRESERVISION AREDS 2) CAPS Take 1 capsule by mouth 2 (two) times a day.    Marland Kitchen NARCAN 4 MG/0.1ML LIQD nasal spray kit Place 1 spray into the nose once.     Marland Kitchen oxybutynin (DITROPAN) 5 MG tablet Take 5 mg by mouth 3 (three) times daily.    Marland Kitchen oxyCODONE (ROXICODONE) 5 MG immediate release tablet Take 1  tablet (5 mg total) by mouth every 4 (four) hours as needed for breakthrough pain. 40 tablet 0  . oxyCODONE-acetaminophen (PERCOCET) 10-325 MG tablet Take 1 tablet by mouth every 6 (six) hours as needed for moderate pain or severe pain.    . pantoprazole (PROTONIX) 20 MG tablet Take 20 mg by mouth 2 (two) times daily.     . pravastatin (PRAVACHOL) 20 MG tablet Take 20 mg by mouth at bedtime.     . vitamin C (ASCORBIC ACID) 500 MG tablet Take 500 mg by mouth daily.    . vitamin E (E-400) 400 UNIT capsule Take 400 Units by mouth daily.     No current facility-administered medications for this visit.    OBJECTIVE: BP (!) 152/85   Pulse 67   Temp 97.7 F (36.5 C) (Tympanic)   Resp 18   Wt 127 lb 1.6 oz (57.7 kg)   BMI 20.51 kg/m    Body mass index is 20.51 kg/m.    ECOG FS:1 - Symptomatic but completely ambulatory  General: Thin, no acute distress. Eyes: Pink conjunctiva, anicteric sclera. HEENT: Normocephalic, moist mucous membranes. Lungs: No audible wheezing or coughing. Heart: Regular rate and rhythm. Abdomen: Soft, nontender, no obvious distention. Musculoskeletal: No edema, cyanosis, or clubbing. Neuro: Alert,  answering all questions appropriately. Cranial nerves grossly intact. Skin: No rashes or petechiae noted. Psych: Normal affect.    LAB RESULTS:  Appointment on 08/07/2020  Component Date Value Ref Range Status  . WBC 08/07/2020 9.6  4.0 - 10.5 K/uL Final  . RBC 08/07/2020 3.94  3.87 - 5.11 MIL/uL Final  . Hemoglobin 08/07/2020 11.2* 12.0 - 15.0 g/dL Final  . HCT 53/98/9537 35.2* 36 - 46 % Final  . MCV 08/07/2020 89.3  80.0 - 100.0 fL Final  . MCH 08/07/2020 28.4  26.0 - 34.0 pg Final  . MCHC 08/07/2020 31.8  30.0 - 36.0 g/dL Final  . RDW 37/35/4614 17.6* 11.5 - 15.5 % Final  . Platelets 08/07/2020 21* 150 - 400 K/uL Final   Comment: PLATELET COUNT PERFORMED ON CITRATED BLOOD PLATELET COUNT CONFIRMED BY SMEAR Immature Platelet Fraction may be clinically indicated, consider ordering this additional test [id number]   . nRBC 08/07/2020 0.0  0.0 - 0.2 % Final  . Neutrophils Relative % 08/07/2020 68  % Final  . Neutro Abs 08/07/2020 6.6  1.7 - 7.7 K/uL Final  . Lymphocytes Relative 08/07/2020 21  % Final  . Lymphs Abs 08/07/2020 2.0  0.7 - 4.0 K/uL Final  . Monocytes Relative 08/07/2020 8  % Final  . Monocytes Absolute 08/07/2020 0.8  0.1 - 1.0 K/uL Final  . Eosinophils Relative 08/07/2020 2  % Final  . Eosinophils Absolute 08/07/2020 0.1  0.0 - 0.5 K/uL Final  . Basophils Relative 08/07/2020 1  % Final  . Basophils Absolute 08/07/2020 0.1  0.0 - 0.1 K/uL Final  . Smear Review 08/07/2020 PLATELETS APPEAR DECREASED   Final   Comment: Reviewed LARGE BODY AND GIANT PLATELETS PRESENT.   . Immature Granulocytes 08/07/2020 0  % Final  . Abs Immature Granulocytes 08/07/2020 0.02  0.00 - 0.07 K/uL Final   Performed at [facility], [address] [place], [place] [address]    STUDIES: No results found.  ASSESSMENT:  Chronic refractory ITP  PLAN:   1. Chronic refractory ITP: Patient had a poor response to Prednisone, WinRho, and Rituxan. She is status post splenectomy in  2007.  She received IVIG and Nplate she received while at  [facility] with significant improvement of her platelets.  If Nplate no longer was effective, could retry IVIG at a future date.  Patient is currently receiving the max dose of Nplate at 10 mcg/kg.  Can also consider Promacta 12.5mg  or Tavalisse 100 mg BID if needed.  Platelet count is 21 today, therefore proceed with treatment as planned.  Return to clinic in 4 weeks for repeat laboratory work, further evaluation, and continuation of Nplate.    2. Weakness and fatigue: Chronic and unchanged. 3.  Depression/grief: Improved.  Secondary to the recent death of her son.  Monitor.  Appreciate palliative care input.   4.  Poor appetite/weight loss: Resolved.  5.  Dementia: Chronic and unchanged.  Patient is now at [employer]. 6.  Leukocytosis: Resolved. 7.  Anemia: Hemoglobin continues to improve and is now 11.2. 8.  Left shoulder pain: Resolved.  Patient expressed understanding and was in agreement with this plan. She also understands that She can call clinic at any time with any questions, concerns, or complaints.    Lloyd Huger, MD   08/07/2020 2:33 PM

## 2020-08-05 DIAGNOSIS — F5104 Psychophysiologic insomnia: Secondary | ICD-10-CM | POA: Diagnosis not present

## 2020-08-05 DIAGNOSIS — F0280 Dementia in other diseases classified elsewhere without behavioral disturbance: Secondary | ICD-10-CM | POA: Diagnosis not present

## 2020-08-05 DIAGNOSIS — R2689 Other abnormalities of gait and mobility: Secondary | ICD-10-CM | POA: Diagnosis not present

## 2020-08-05 DIAGNOSIS — G301 Alzheimer's disease with late onset: Secondary | ICD-10-CM | POA: Diagnosis not present

## 2020-08-06 ENCOUNTER — Encounter: Payer: Self-pay | Admitting: Oncology

## 2020-08-06 NOTE — Progress Notes (Signed)
Spoke to patient's husband for pres assessment. He denies patient is having any concerns at this time. States she does complain about left shoulder pain and rates pain at 8.

## 2020-08-07 ENCOUNTER — Inpatient Hospital Stay (HOSPITAL_BASED_OUTPATIENT_CLINIC_OR_DEPARTMENT_OTHER): Payer: Medicare HMO | Admitting: Oncology

## 2020-08-07 ENCOUNTER — Inpatient Hospital Stay: Payer: Medicare HMO

## 2020-08-07 ENCOUNTER — Inpatient Hospital Stay (HOSPITAL_BASED_OUTPATIENT_CLINIC_OR_DEPARTMENT_OTHER): Payer: Medicare HMO | Admitting: Hospice and Palliative Medicine

## 2020-08-07 ENCOUNTER — Other Ambulatory Visit: Payer: Self-pay

## 2020-08-07 VITALS — BP 152/85 | HR 67 | Temp 97.7°F | Resp 18 | Wt 127.1 lb

## 2020-08-07 DIAGNOSIS — D693 Immune thrombocytopenic purpura: Secondary | ICD-10-CM | POA: Diagnosis not present

## 2020-08-07 DIAGNOSIS — M5030 Other cervical disc degeneration, unspecified cervical region: Secondary | ICD-10-CM | POA: Diagnosis not present

## 2020-08-07 DIAGNOSIS — I4891 Unspecified atrial fibrillation: Secondary | ICD-10-CM | POA: Diagnosis not present

## 2020-08-07 DIAGNOSIS — Z515 Encounter for palliative care: Secondary | ICD-10-CM

## 2020-08-07 DIAGNOSIS — Z471 Aftercare following joint replacement surgery: Secondary | ICD-10-CM | POA: Diagnosis not present

## 2020-08-07 DIAGNOSIS — M19042 Primary osteoarthritis, left hand: Secondary | ICD-10-CM | POA: Diagnosis not present

## 2020-08-07 DIAGNOSIS — F4329 Adjustment disorder with other symptoms: Secondary | ICD-10-CM | POA: Diagnosis not present

## 2020-08-07 DIAGNOSIS — Z79899 Other long term (current) drug therapy: Secondary | ICD-10-CM | POA: Diagnosis not present

## 2020-08-07 DIAGNOSIS — Z96612 Presence of left artificial shoulder joint: Secondary | ICD-10-CM | POA: Diagnosis not present

## 2020-08-07 DIAGNOSIS — F0390 Unspecified dementia without behavioral disturbance: Secondary | ICD-10-CM | POA: Diagnosis not present

## 2020-08-07 DIAGNOSIS — I10 Essential (primary) hypertension: Secondary | ICD-10-CM | POA: Diagnosis not present

## 2020-08-07 DIAGNOSIS — M19041 Primary osteoarthritis, right hand: Secondary | ICD-10-CM | POA: Diagnosis not present

## 2020-08-07 DIAGNOSIS — M5136 Other intervertebral disc degeneration, lumbar region: Secondary | ICD-10-CM | POA: Diagnosis not present

## 2020-08-07 LAB — CBC WITH DIFFERENTIAL/PLATELET
Abs Immature Granulocytes: 0.02 10*3/uL (ref 0.00–0.07)
Basophils Absolute: 0.1 10*3/uL (ref 0.0–0.1)
Basophils Relative: 1 %
Eosinophils Absolute: 0.1 10*3/uL (ref 0.0–0.5)
Eosinophils Relative: 2 %
HCT: 35.2 % — ABNORMAL LOW (ref 36.0–46.0)
Hemoglobin: 11.2 g/dL — ABNORMAL LOW (ref 12.0–15.0)
Immature Granulocytes: 0 %
Lymphocytes Relative: 21 %
Lymphs Abs: 2 10*3/uL (ref 0.7–4.0)
MCH: 28.4 pg (ref 26.0–34.0)
MCHC: 31.8 g/dL (ref 30.0–36.0)
MCV: 89.3 fL (ref 80.0–100.0)
Monocytes Absolute: 0.8 10*3/uL (ref 0.1–1.0)
Monocytes Relative: 8 %
Neutro Abs: 6.6 10*3/uL (ref 1.7–7.7)
Neutrophils Relative %: 68 %
Platelets: 21 10*3/uL — CL (ref 150–400)
RBC: 3.94 MIL/uL (ref 3.87–5.11)
RDW: 17.6 % — ABNORMAL HIGH (ref 11.5–15.5)
Smear Review: DECREASED
WBC: 9.6 10*3/uL (ref 4.0–10.5)
nRBC: 0 % (ref 0.0–0.2)

## 2020-08-07 MED ORDER — ROMIPLOSTIM INJECTION 500 MCG
10.0000 ug/kg | Freq: Once | SUBCUTANEOUS | Status: AC
  Administered 2020-08-07: 575 ug via SUBCUTANEOUS
  Filled 2020-08-07: qty 1

## 2020-08-08 NOTE — Progress Notes (Signed)
[place]  [phone] [phone]   Name: Labresha Mellor Date: 08/08/2020 MRN: [id number]  DOB: 04/25/1933  Patient Care Team: Idelle Crouch, MD as PCP - General (Internal Medicine) Lloyd Huger, MD as Consulting Physician (Oncology)    REASON FOR CONSULTATION: Ariana [name] is a 86 y.o. female with multiple medical problems including chronic refractory ITP on Nplate, Alzheimer's dementia, OSA, and anxiety/depression.  Patient was hospitalized 02/15/2020-02/18/2020 with failure to thrive following the death of her son.  Patient was referred to palliative care to address goals and provide with support.  SOCIAL HISTORY:     reports that she has never smoked. She has never used smokeless tobacco. She reports that she does not drink alcohol and does not use drugs.   Patient is married and lives at home with her husband.  Both of patient's children are now deceased she previously worked in [employer].  ADVANCE DIRECTIVES:  None on file  CODE STATUS:   PAST MEDICAL HISTORY: Past Medical History:  Diagnosis Date  . Alzheimer disease ([place])   . Anemia   . Anxiety   . Asthma    WELL CONTROLLED  . Chronic back pain   . Depression   . Depression   . GERD (gastroesophageal reflux disease)   . Hiatal hernia   . Hypercholesteremia   . Hypothyroidism   . ITP (idiopathic thrombocytopenic purpura)    FOLLOWED BR DR Grayland Ormond  . LBBB (left bundle branch block)   . Osteoarthritis   . Osteoporosis   . Restless legs     PAST SURGICAL HISTORY:  Past Surgical History:  Procedure Laterality Date  . CARPAL TUNNEL RELEASE    . ESOPHAGOGASTRODUODENOSCOPY (EGD) WITH PROPOFOL N/A 09/10/2015   Procedure: ESOPHAGOGASTRODUODENOSCOPY (EGD) WITH PROPOFOL;  Surgeon: Lollie Sails, MD;  Location: [facility] ENDOSCOPY;  Service: Endoscopy;  Laterality: N/A;  . IRRIGATION AND DEBRIDEMENT HEMATOMA Right  07/13/2018   Procedure: IRRIGATION AND DEBRIDEMENT HEMATOMA-RIGHT SHIN;  Surgeon: Herbert Pun, MD;  Location: [facility] ORS;  Service: General;  Laterality: Right;  . KYPHOPLASTY N/A 05/02/2019   Procedure: L1 KYPHOPLASTY;  Surgeon: Hessie Knows, MD;  Location: [facility] ORS;  Service: Orthopedics;  Laterality: N/A;  . LUMBAR [place]    . REVERSE SHOULDER ARTHROPLASTY Left 06/25/2020   Procedure: REVERSE SHOULDER ARTHROPLASTY;  Surgeon: Corky Mull, MD;  Location: [facility] ORS;  Service: Orthopedics;  Laterality: Left;  . SPLENECTOMY, PARTIAL    . TOTAL ABDOMINAL HYSTERECTOMY      HEMATOLOGY/ONCOLOGY HISTORY:  Oncology History   No history exists.    ALLERGIES:  is allergic to aspirin, diazepam, other, tetanus toxoids, and morphine.  MEDICATIONS:  Current Outpatient Medications  Medication Sig Dispense Refill  . acetaminophen (TYLENOL) 325 MG tablet Take 2 tablets (650 mg total) by mouth every 6 (six) hours as needed for mild pain (or Fever >/= 101). 100 tablet 0  . albuterol (PROVENTIL HFA;VENTOLIN HFA) 108 (90 BASE) MCG/ACT inhaler Inhale 2 puffs into the lungs every 6 (six) hours as needed for wheezing or shortness of breath.    . ALPRAZolam (XANAX) 0.5 MG tablet Take 1 tablet (0.5 mg total) by mouth 3 (three) times daily as needed for anxiety. 20 tablet 0  . buPROPion (WELLBUTRIN XL) 150 MG 24 hr tablet Take 150 mg by mouth every morning.     . busPIRone (BUSPAR) 15 MG tablet Take 15 mg by mouth 2 (two) times daily.     Marland Kitchen  Calcium Carbonate-Vit D-Min (CALCIUM 600+D PLUS MINERALS) 600-400 MG-UNIT TABS Take 1 tablet by mouth daily.     . Carboxymethylcellul-Glycerin (LUBRICATING EYE DROPS OP) Place 1 drop into both eyes daily as needed (dry eyes).    . Cholecalciferol (VITAMIN D) 50 MCG (2000 UT) tablet Take 2,000 Units by mouth daily.     . citalopram (CELEXA) 40 MG tablet Take 40 mg by mouth every morning.     . Cyanocobalamin (RA VITAMIN B-12 TR) 1000 MCG TBCR Take 1,000 mcg by  mouth daily.     Marland Kitchen donepezil (ARICEPT) 10 MG tablet Take 10 mg by mouth at bedtime.     . feeding supplement, ENSURE ENLIVE, (ENSURE ENLIVE) LIQD Take 237 mLs by mouth 2 (two) times daily between meals. (Patient taking differently: Take 237 mLs by mouth daily with breakfast. ) 237 mL 12  . fluticasone (FLONASE) 50 MCG/ACT nasal spray Place 2 sprays into both nostrils daily as needed for allergies.     . Fluticasone-Salmeterol (ADVAIR) 250-50 MCG/DOSE AEPB Inhale 1 puff into the lungs 2 (two) times daily as needed (shortness of breath).     . gabapentin (NEURONTIN) 300 MG capsule Take 300 mg by mouth 4 (four) times daily.    Marland Kitchen levothyroxine (SYNTHROID, LEVOTHROID) 50 MCG tablet Take 50 mcg by mouth daily before breakfast. Take 30 to 60 minutes before breakfast.    . memantine (NAMENDA) 10 MG tablet Take 10 mg by mouth 2 (two) times daily.     . montelukast (SINGULAIR) 10 MG tablet Take 10 mg by mouth at bedtime.     . Multiple Vitamin (MULTI-VITAMIN) tablet Take 1 tablet by mouth daily.     . Multiple Vitamins-Minerals (PRESERVISION AREDS 2) CAPS Take 1 capsule by mouth 2 (two) times a day.    Marland Kitchen NARCAN 4 MG/0.1ML LIQD nasal spray kit Place 1 spray into the nose once.     Marland Kitchen oxybutynin (DITROPAN) 5 MG tablet Take 5 mg by mouth 3 (three) times daily.    Marland Kitchen oxyCODONE (ROXICODONE) 5 MG immediate release tablet Take 1 tablet (5 mg total) by mouth every 4 (four) hours as needed for breakthrough pain. 40 tablet 0  . oxyCODONE-acetaminophen (PERCOCET) 10-325 MG tablet Take 1 tablet by mouth every 6 (six) hours as needed for moderate pain or severe pain.    . pantoprazole (PROTONIX) 20 MG tablet Take 20 mg by mouth 2 (two) times daily.     . pravastatin (PRAVACHOL) 20 MG tablet Take 20 mg by mouth at bedtime.     . vitamin C (ASCORBIC ACID) 500 MG tablet Take 500 mg by mouth daily.    . vitamin E (E-400) 400 UNIT capsule Take 400 Units by mouth daily.     No current facility-administered medications for this  visit.    VITAL SIGNS: There were no vitals taken for this visit. There were no vitals filed for this visit.  Estimated body mass index is 20.51 kg/m as calculated from the following:   Height as of 07/10/20: $RemoveBef'5\' 6"'CINgIBnbNC$  (1.676 m).   Weight as of an earlier encounter on 08/07/20: 127 lb 1.6 oz (57.7 kg).  LABS: CBC:    Component Value Date/Time   WBC 9.6 08/07/2020 1014   HGB 11.2 (L) 08/07/2020 1014   HGB 14.2 01/18/2015 0941   HCT 35.2 (L) 08/07/2020 1014   HCT 43.0 01/18/2015 0941   PLT 21 (LL) 08/07/2020 1014   PLT 97 (L) 02/04/2015 0949   MCV 89.3 08/07/2020 1014  MCV 90 01/18/2015 0941   NEUTROABS 6.6 08/07/2020 1014   NEUTROABS 6.5 01/18/2015 0941   LYMPHSABS 2.0 08/07/2020 1014   LYMPHSABS 2.3 01/18/2015 0941   MONOABS 0.8 08/07/2020 1014   MONOABS 0.7 01/18/2015 0941   EOSABS 0.1 08/07/2020 1014   EOSABS 0.1 01/18/2015 0941   BASOSABS 0.1 08/07/2020 1014   BASOSABS 0.1 01/18/2015 0941   Comprehensive Metabolic Panel:    Component Value Date/Time   NA 139 06/19/2020 1150   NA 143 06/13/2014 1428   K 3.9 06/19/2020 1150   K 4.2 06/13/2014 1428   CL 100 06/19/2020 1150   CL 105 06/13/2014 1428   CO2 29 06/19/2020 1150   CO2 31 06/13/2014 1428   BUN 16 06/19/2020 1150   BUN 15 06/13/2014 1428   CREATININE 1.13 (H) 06/19/2020 1150   CREATININE 1.05 06/13/2014 1428   GLUCOSE 103 (H) 06/19/2020 1150   GLUCOSE 70 06/13/2014 1428   CALCIUM 9.3 06/19/2020 1150   CALCIUM 8.5 06/13/2014 1428   AST 22 06/19/2020 1150   AST 31 06/01/2014 1109   ALT 21 06/19/2020 1150   ALT 76 (H) 06/01/2014 1109   ALKPHOS 81 06/19/2020 1150   ALKPHOS 93 06/01/2014 1109   BILITOT 0.5 06/19/2020 1150   BILITOT 0.3 06/01/2014 1109   PROT 6.8 06/19/2020 1150   PROT 6.5 06/01/2014 1109   ALBUMIN 4.2 06/19/2020 1150   ALBUMIN 3.4 06/01/2014 1109    RADIOGRAPHIC STUDIES: No results found.  PERFORMANCE STATUS (ECOG) : 1 - Symptomatic but completely ambulatory  Review of  Systems Unless otherwise noted, a complete review of systems is negative.  Physical Exam General: NAD, thin Pulmonary: Unlabored Extremities: no edema, no joint deformities Skin: no rashes Neurological: Weakness but otherwise nonfocal  IMPRESSION: Routine follow-up visit today.  Patient was accompanied by her niece.  Reportedly patient's husband is dealing with an ill relative.  Per niece, patient is doing well at home.  She reports that depression is stable.  Patient is rarely having episodes of intense grief following the loss of her son.  Patient is reportedly eating well and sleeping well.  Weight appears stable over the past month.  No symptomatic complaints at present.  PLAN: -Continue current scope of treatment -Continue antidepressants -Will benefit from future conversation about ACP/MOST form -RTC in 1 month   Patient expressed understanding and was in agreement with this plan. She also understands that She can call the clinic at any time with any questions, concerns, or complaints.     Time Total: 15 minutes  Visit consisted of counseling and education dealing with the complex and emotionally intense issues of symptom management and palliative care in the setting of serious and potentially life-threatening illness.Greater than 50%  of this time was spent counseling and coordinating care related to the above assessment and plan.  Signed by: Altha Harm, PhD, NP-C

## 2020-08-09 DIAGNOSIS — F0390 Unspecified dementia without behavioral disturbance: Secondary | ICD-10-CM | POA: Diagnosis not present

## 2020-08-09 DIAGNOSIS — I4891 Unspecified atrial fibrillation: Secondary | ICD-10-CM | POA: Diagnosis not present

## 2020-08-09 DIAGNOSIS — Z471 Aftercare following joint replacement surgery: Secondary | ICD-10-CM | POA: Diagnosis not present

## 2020-08-09 DIAGNOSIS — I10 Essential (primary) hypertension: Secondary | ICD-10-CM | POA: Diagnosis not present

## 2020-08-09 DIAGNOSIS — M19041 Primary osteoarthritis, right hand: Secondary | ICD-10-CM | POA: Diagnosis not present

## 2020-08-09 DIAGNOSIS — Z96612 Presence of left artificial shoulder joint: Secondary | ICD-10-CM | POA: Diagnosis not present

## 2020-08-09 DIAGNOSIS — M5030 Other cervical disc degeneration, unspecified cervical region: Secondary | ICD-10-CM | POA: Diagnosis not present

## 2020-08-09 DIAGNOSIS — M5136 Other intervertebral disc degeneration, lumbar region: Secondary | ICD-10-CM | POA: Diagnosis not present

## 2020-08-09 DIAGNOSIS — M19042 Primary osteoarthritis, left hand: Secondary | ICD-10-CM | POA: Diagnosis not present

## 2020-08-12 DIAGNOSIS — M19041 Primary osteoarthritis, right hand: Secondary | ICD-10-CM | POA: Diagnosis not present

## 2020-08-12 DIAGNOSIS — M5136 Other intervertebral disc degeneration, lumbar region: Secondary | ICD-10-CM | POA: Diagnosis not present

## 2020-08-12 DIAGNOSIS — M19042 Primary osteoarthritis, left hand: Secondary | ICD-10-CM | POA: Diagnosis not present

## 2020-08-12 DIAGNOSIS — F0390 Unspecified dementia without behavioral disturbance: Secondary | ICD-10-CM | POA: Diagnosis not present

## 2020-08-12 DIAGNOSIS — I10 Essential (primary) hypertension: Secondary | ICD-10-CM | POA: Diagnosis not present

## 2020-08-12 DIAGNOSIS — Z471 Aftercare following joint replacement surgery: Secondary | ICD-10-CM | POA: Diagnosis not present

## 2020-08-12 DIAGNOSIS — I4891 Unspecified atrial fibrillation: Secondary | ICD-10-CM | POA: Diagnosis not present

## 2020-08-12 DIAGNOSIS — Z96612 Presence of left artificial shoulder joint: Secondary | ICD-10-CM | POA: Diagnosis not present

## 2020-08-12 DIAGNOSIS — M5030 Other cervical disc degeneration, unspecified cervical region: Secondary | ICD-10-CM | POA: Diagnosis not present

## 2020-08-14 DIAGNOSIS — F0390 Unspecified dementia without behavioral disturbance: Secondary | ICD-10-CM | POA: Diagnosis not present

## 2020-08-14 DIAGNOSIS — Z96612 Presence of left artificial shoulder joint: Secondary | ICD-10-CM | POA: Diagnosis not present

## 2020-08-14 DIAGNOSIS — M19041 Primary osteoarthritis, right hand: Secondary | ICD-10-CM | POA: Diagnosis not present

## 2020-08-14 DIAGNOSIS — I4891 Unspecified atrial fibrillation: Secondary | ICD-10-CM | POA: Diagnosis not present

## 2020-08-14 DIAGNOSIS — M5136 Other intervertebral disc degeneration, lumbar region: Secondary | ICD-10-CM | POA: Diagnosis not present

## 2020-08-14 DIAGNOSIS — I10 Essential (primary) hypertension: Secondary | ICD-10-CM | POA: Diagnosis not present

## 2020-08-14 DIAGNOSIS — M19042 Primary osteoarthritis, left hand: Secondary | ICD-10-CM | POA: Diagnosis not present

## 2020-08-14 DIAGNOSIS — M5030 Other cervical disc degeneration, unspecified cervical region: Secondary | ICD-10-CM | POA: Diagnosis not present

## 2020-08-14 DIAGNOSIS — Z471 Aftercare following joint replacement surgery: Secondary | ICD-10-CM | POA: Diagnosis not present

## 2020-08-16 DIAGNOSIS — R7989 Other specified abnormal findings of blood chemistry: Secondary | ICD-10-CM | POA: Diagnosis not present

## 2020-08-18 ENCOUNTER — Observation Stay
Admission: EM | Admit: 2020-08-18 | Discharge: 2020-08-20 | Disposition: A | Discharge status: Home / Self Care | Payer: Medicare HMO | Attending: Internal Medicine | Admitting: Internal Medicine

## 2020-08-18 ENCOUNTER — Observation Stay: Payer: Medicare HMO

## 2020-08-18 ENCOUNTER — Emergency Department: Payer: Medicare HMO

## 2020-08-18 ENCOUNTER — Other Ambulatory Visit: Payer: Self-pay

## 2020-08-18 ENCOUNTER — Encounter: Payer: Self-pay | Admitting: Emergency Medicine

## 2020-08-18 DIAGNOSIS — E785 Hyperlipidemia, unspecified: Secondary | ICD-10-CM | POA: Diagnosis not present

## 2020-08-18 DIAGNOSIS — J45909 Unspecified asthma, uncomplicated: Secondary | ICD-10-CM | POA: Insufficient documentation

## 2020-08-18 DIAGNOSIS — K219 Gastro-esophageal reflux disease without esophagitis: Secondary | ICD-10-CM | POA: Diagnosis not present

## 2020-08-18 DIAGNOSIS — K625 Hemorrhage of anus and rectum: Secondary | ICD-10-CM | POA: Diagnosis not present

## 2020-08-18 DIAGNOSIS — M4856XA Collapsed vertebra, not elsewhere classified, lumbar region, initial encounter for fracture: Secondary | ICD-10-CM | POA: Diagnosis not present

## 2020-08-18 DIAGNOSIS — Z20822 Contact with and (suspected) exposure to covid-19: Secondary | ICD-10-CM | POA: Diagnosis not present

## 2020-08-18 DIAGNOSIS — R059 Cough, unspecified: Secondary | ICD-10-CM

## 2020-08-18 DIAGNOSIS — G301 Alzheimer's disease with late onset: Secondary | ICD-10-CM | POA: Diagnosis not present

## 2020-08-18 DIAGNOSIS — D696 Thrombocytopenia, unspecified: Secondary | ICD-10-CM | POA: Diagnosis not present

## 2020-08-18 DIAGNOSIS — E039 Hypothyroidism, unspecified: Secondary | ICD-10-CM | POA: Diagnosis not present

## 2020-08-18 DIAGNOSIS — J4521 Mild intermittent asthma with (acute) exacerbation: Secondary | ICD-10-CM | POA: Diagnosis not present

## 2020-08-18 DIAGNOSIS — D72829 Elevated white blood cell count, unspecified: Secondary | ICD-10-CM | POA: Diagnosis not present

## 2020-08-18 DIAGNOSIS — I10 Essential (primary) hypertension: Secondary | ICD-10-CM | POA: Diagnosis not present

## 2020-08-18 DIAGNOSIS — K922 Gastrointestinal hemorrhage, unspecified: Secondary | ICD-10-CM

## 2020-08-18 DIAGNOSIS — D473 Essential (hemorrhagic) thrombocythemia: Secondary | ICD-10-CM | POA: Diagnosis present

## 2020-08-18 DIAGNOSIS — F0280 Dementia in other diseases classified elsewhere without behavioral disturbance: Secondary | ICD-10-CM | POA: Diagnosis present

## 2020-08-18 DIAGNOSIS — Z79899 Other long term (current) drug therapy: Secondary | ICD-10-CM | POA: Insufficient documentation

## 2020-08-18 DIAGNOSIS — M5136 Other intervertebral disc degeneration, lumbar region: Secondary | ICD-10-CM | POA: Diagnosis not present

## 2020-08-18 DIAGNOSIS — K5730 Diverticulosis of large intestine without perforation or abscess without bleeding: Secondary | ICD-10-CM | POA: Diagnosis not present

## 2020-08-18 LAB — CBC
HCT: 33.6 % — ABNORMAL LOW (ref 36.0–46.0)
HCT: 33.5 % — ABNORMAL LOW (ref 36.0–46.0)
HCT: 35 % — ABNORMAL LOW (ref 36.0–46.0)
HCT: 35 % — ABNORMAL LOW (ref 36.0–46.0)
Hemoglobin: 10.5 g/dL — ABNORMAL LOW (ref 12.0–15.0)
Hemoglobin: 10.4 g/dL — ABNORMAL LOW (ref 12.0–15.0)
Hemoglobin: 10.9 g/dL — ABNORMAL LOW (ref 12.0–15.0)
Hemoglobin: 10.8 g/dL — ABNORMAL LOW (ref 12.0–15.0)
MCH: 28.2 pg (ref 26.0–34.0)
MCH: 27.7 pg (ref 26.0–34.0)
MCH: 27.8 pg (ref 26.0–34.0)
MCH: 27.5 pg (ref 26.0–34.0)
MCHC: 31.3 g/dL (ref 30.0–36.0)
MCHC: 31 g/dL (ref 30.0–36.0)
MCHC: 31.1 g/dL (ref 30.0–36.0)
MCHC: 30.9 g/dL (ref 30.0–36.0)
MCV: 90.1 fL (ref 80.0–100.0)
MCV: 89.3 fL (ref 80.0–100.0)
MCV: 89.3 fL (ref 80.0–100.0)
MCV: 89.1 fL (ref 80.0–100.0)
Platelets: 200 10*3/uL (ref 150–400)
Platelets: 211 10*3/uL (ref 150–400)
Platelets: 221 10*3/uL (ref 150–400)
Platelets: 232 10*3/uL (ref 150–400)
RBC: 3.73 MIL/uL — ABNORMAL LOW (ref 3.87–5.11)
RBC: 3.75 MIL/uL — ABNORMAL LOW (ref 3.87–5.11)
RBC: 3.92 MIL/uL (ref 3.87–5.11)
RBC: 3.93 MIL/uL (ref 3.87–5.11)
RDW: 17.2 % — ABNORMAL HIGH (ref 11.5–15.5)
RDW: 17.3 % — ABNORMAL HIGH (ref 11.5–15.5)
RDW: 17.4 % — ABNORMAL HIGH (ref 11.5–15.5)
RDW: 17.3 % — ABNORMAL HIGH (ref 11.5–15.5)
WBC: 19.6 10*3/uL — ABNORMAL HIGH (ref 4.0–10.5)
WBC: 17.9 10*3/uL — ABNORMAL HIGH (ref 4.0–10.5)
WBC: 15 10*3/uL — ABNORMAL HIGH (ref 4.0–10.5)
WBC: 15.2 10*3/uL — ABNORMAL HIGH (ref 4.0–10.5)
nRBC: 0.1 % (ref 0.0–0.2)
nRBC: 0 % (ref 0.0–0.2)
nRBC: 0.3 % — ABNORMAL HIGH (ref 0.0–0.2)
nRBC: 0 % (ref 0.0–0.2)

## 2020-08-18 LAB — RESPIRATORY PANEL BY RT PCR (FLU A&B, COVID)
Influenza A by PCR: NEGATIVE
Influenza B by PCR: NEGATIVE
SARS Coronavirus 2 by RT PCR: NEGATIVE

## 2020-08-18 LAB — COMPREHENSIVE METABOLIC PANEL
ALT: 18 U/L (ref 0–44)
AST: 32 U/L (ref 15–41)
Albumin: 3.9 g/dL (ref 3.5–5.0)
Alkaline Phosphatase: 102 U/L (ref 38–126)
Anion gap: 7 (ref 5–15)
BUN: 19 mg/dL (ref 8–23)
CO2: 28 mmol/L (ref 22–32)
Calcium: 9.4 mg/dL (ref 8.9–10.3)
Chloride: 104 mmol/L (ref 98–111)
Creatinine, Ser: 0.92 mg/dL (ref 0.44–1.00)
GFR, Estimated: >60 mL/min (ref >60)
Glucose, Bld: 92 mg/dL (ref 70–99)
Potassium: 3.9 mmol/L (ref 3.5–5.1)
Sodium: 139 mmol/L (ref 135–145)
Total Bilirubin: 0.6 mg/dL (ref 0.3–1.2)
Total Protein: 6.3 g/dL — ABNORMAL LOW (ref 6.5–8.1)

## 2020-08-18 LAB — TYPE AND SCREEN
ABO/RH(D): B POS
Antibody Screen: NEGATIVE

## 2020-08-18 LAB — APTT: aPTT: 30 seconds (ref 24–36)

## 2020-08-18 LAB — PROTIME-INR
INR: 1 (ref 0.8–1.2)
Prothrombin Time: 12.4 seconds (ref 11.4–15.2)

## 2020-08-18 MED ORDER — MOMETASONE FURO-FORMOTEROL FUM 200-5 MCG/ACT IN AERO
2.0000 | INHALATION_SPRAY | Freq: Two times a day (BID) | RESPIRATORY_TRACT | Status: DC
  Administered 2020-08-18 – 2020-08-20 (×4): 2 via RESPIRATORY_TRACT
  Filled 2020-08-18: qty 8.8

## 2020-08-18 MED ORDER — BUPROPION HCL ER (XL) 150 MG PO TB24
150.0000 mg | ORAL_TABLET | ORAL | Status: DC
  Administered 2020-08-19 – 2020-08-20 (×2): 150 mg via ORAL
  Filled 2020-08-18 (×2): qty 1

## 2020-08-18 MED ORDER — MEMANTINE HCL 5 MG PO TABS
10.0000 mg | ORAL_TABLET | Freq: Two times a day (BID) | ORAL | Status: DC
  Administered 2020-08-18 – 2020-08-20 (×4): 10 mg via ORAL
  Filled 2020-08-18 (×4): qty 2

## 2020-08-18 MED ORDER — OXYCODONE HCL 5 MG PO TABS
5.0000 mg | ORAL_TABLET | Freq: Four times a day (QID) | ORAL | Status: DC | PRN
  Administered 2020-08-18: 5 mg via ORAL
  Filled 2020-08-18: qty 1

## 2020-08-18 MED ORDER — DM-GUAIFENESIN ER 30-600 MG PO TB12: 1.0000 | ORAL_TABLET | Freq: Two times a day (BID) | ORAL | Status: DC | PRN

## 2020-08-18 MED ORDER — LAMOTRIGINE 25 MG PO TABS
50.0000 mg | ORAL_TABLET | Freq: Every day | ORAL | Status: DC
  Administered 2020-08-18 – 2020-08-19 (×2): 50 mg via ORAL
  Filled 2020-08-18 (×2): qty 2

## 2020-08-18 MED ORDER — GABAPENTIN 300 MG PO CAPS
300.0000 mg | ORAL_CAPSULE | Freq: Four times a day (QID) | ORAL | Status: DC
  Administered 2020-08-18 – 2020-08-20 (×8): 300 mg via ORAL
  Filled 2020-08-18 (×8): qty 1

## 2020-08-18 MED ORDER — MONTELUKAST SODIUM 10 MG PO TABS
10.0000 mg | ORAL_TABLET | Freq: Every day | ORAL | Status: DC
  Administered 2020-08-18 – 2020-08-19 (×2): 10 mg via ORAL
  Filled 2020-08-18 (×2): qty 1

## 2020-08-18 MED ORDER — BUSPIRONE HCL 15 MG PO TABS
15.0000 mg | ORAL_TABLET | Freq: Two times a day (BID) | ORAL | Status: DC
  Administered 2020-08-18 – 2020-08-20 (×4): 15 mg via ORAL
  Filled 2020-08-18 (×5): qty 1

## 2020-08-18 MED ORDER — ALPRAZOLAM 0.5 MG PO TABS
0.5000 mg | ORAL_TABLET | Freq: Three times a day (TID) | ORAL | Status: DC | PRN
  Administered 2020-08-18: 0.5 mg via ORAL
  Filled 2020-08-18: qty 1

## 2020-08-18 MED ORDER — VITAMIN B-12 1000 MCG PO TABS
1000.0000 ug | ORAL_TABLET | Freq: Every day | ORAL | Status: DC
  Administered 2020-08-19 – 2020-08-20 (×2): 1000 ug via ORAL
  Filled 2020-08-18 (×2): qty 1

## 2020-08-18 MED ORDER — ONDANSETRON HCL 4 MG/2ML IJ SOLN
4.0000 mg | Freq: Three times a day (TID) | INTRAMUSCULAR | Status: DC | PRN
  Administered 2020-08-18: 4 mg via INTRAVENOUS
  Filled 2020-08-18: qty 2

## 2020-08-18 MED ORDER — CITALOPRAM HYDROBROMIDE 20 MG PO TABS
40.0000 mg | ORAL_TABLET | ORAL | Status: DC
  Administered 2020-08-19 – 2020-08-20 (×2): 40 mg via ORAL
  Filled 2020-08-18 (×2): qty 2

## 2020-08-18 MED ORDER — DONEPEZIL HCL 5 MG PO TABS
10.0000 mg | ORAL_TABLET | Freq: Every day | ORAL | Status: DC
  Administered 2020-08-18 – 2020-08-19 (×2): 10 mg via ORAL
  Filled 2020-08-18 (×3): qty 2

## 2020-08-18 MED ORDER — SODIUM CHLORIDE 0.9 % IV SOLN: INTRAVENOUS | Status: DC

## 2020-08-18 MED ORDER — ADULT MULTIVITAMIN W/MINERALS CH
1.0000 | ORAL_TABLET | Freq: Every day | ORAL | Status: DC
  Administered 2020-08-19 – 2020-08-20 (×2): 1 via ORAL
  Filled 2020-08-18 (×2): qty 1

## 2020-08-18 MED ORDER — OXYCODONE-ACETAMINOPHEN 10-325 MG PO TABS: 1.0000 | ORAL_TABLET | Freq: Four times a day (QID) | ORAL | Status: DC | PRN

## 2020-08-18 MED ORDER — ALBUTEROL SULFATE HFA 108 (90 BASE) MCG/ACT IN AERS
2.0000 | INHALATION_SPRAY | RESPIRATORY_TRACT | Status: DC | PRN
  Filled 2020-08-18: qty 6.7

## 2020-08-18 MED ORDER — PANTOPRAZOLE SODIUM 40 MG IV SOLR
40.0000 mg | Freq: Two times a day (BID) | INTRAVENOUS | Status: DC
  Administered 2020-08-18 – 2020-08-20 (×5): 40 mg via INTRAVENOUS
  Filled 2020-08-18 (×5): qty 40

## 2020-08-18 MED ORDER — ASCORBIC ACID 500 MG PO TABS
500.0000 mg | ORAL_TABLET | Freq: Every day | ORAL | Status: DC
  Administered 2020-08-19 – 2020-08-20 (×2): 500 mg via ORAL
  Filled 2020-08-18 (×2): qty 1

## 2020-08-18 MED ORDER — PRAVASTATIN SODIUM 20 MG PO TABS
20.0000 mg | ORAL_TABLET | Freq: Every day | ORAL | Status: DC
  Administered 2020-08-18 – 2020-08-19 (×2): 20 mg via ORAL
  Filled 2020-08-18 (×2): qty 1

## 2020-08-18 MED ORDER — HYDRALAZINE HCL 20 MG/ML IJ SOLN
5.0000 mg | INTRAMUSCULAR | Status: DC | PRN
  Administered 2020-08-18: 5 mg via INTRAVENOUS
  Filled 2020-08-18: qty 1

## 2020-08-18 MED ORDER — FLUTICASONE PROPIONATE 50 MCG/ACT NA SUSP
2.0000 | Freq: Every day | NASAL | Status: DC | PRN
  Filled 2020-08-18: qty 16

## 2020-08-18 MED ORDER — OXYBUTYNIN CHLORIDE 5 MG PO TABS
5.0000 mg | ORAL_TABLET | Freq: Three times a day (TID) | ORAL | Status: DC
  Administered 2020-08-18 – 2020-08-20 (×6): 5 mg via ORAL
  Filled 2020-08-18 (×7): qty 1

## 2020-08-18 MED ORDER — ACETAMINOPHEN 325 MG PO TABS: 650.0000 mg | ORAL_TABLET | Freq: Four times a day (QID) | ORAL | Status: DC | PRN

## 2020-08-18 MED ORDER — IOHEXOL 350 MG/ML SOLN
100.0000 mL | Freq: Once | INTRAVENOUS | Status: AC | PRN
  Administered 2020-08-18: 100 mL via INTRAVENOUS

## 2020-08-18 MED ORDER — VITAMIN E 180 MG (400 UNIT) PO CAPS
400.0000 [IU] | ORAL_CAPSULE | Freq: Every day | ORAL | Status: DC
  Administered 2020-08-19 – 2020-08-20 (×2): 400 [IU] via ORAL
  Filled 2020-08-18 (×3): qty 1

## 2020-08-18 MED ORDER — VITAMIN D3 25 MCG (1000 UNIT) PO TABS
2000.0000 [IU] | ORAL_TABLET | Freq: Every day | ORAL | Status: DC
  Administered 2020-08-19 – 2020-08-20 (×2): 2000 [IU] via ORAL
  Filled 2020-08-18 (×5): qty 2

## 2020-08-18 MED ORDER — CALCIUM CARBONATE-VITAMIN D 500-200 MG-UNIT PO TABS
1.0000 | ORAL_TABLET | Freq: Every day | ORAL | Status: DC
  Administered 2020-08-19 – 2020-08-20 (×2): 1 via ORAL
  Filled 2020-08-18 (×2): qty 1

## 2020-08-18 MED ORDER — LEVOTHYROXINE SODIUM 50 MCG PO TABS
50.0000 ug | ORAL_TABLET | Freq: Every day | ORAL | Status: DC
  Administered 2020-08-19 – 2020-08-20 (×2): 50 ug via ORAL
  Filled 2020-08-18 (×2): qty 1

## 2020-08-18 MED ORDER — POLYVINYL ALCOHOL 1.4 % OP SOLN
1.0000 [drp] | OPHTHALMIC | Status: DC | PRN
  Filled 2020-08-18: qty 15

## 2020-08-18 MED ORDER — OXYCODONE-ACETAMINOPHEN 5-325 MG PO TABS: 1.0000 | ORAL_TABLET | Freq: Four times a day (QID) | ORAL | Status: DC | PRN

## 2020-08-18 MED ORDER — AMOXICILLIN 250 MG/5ML PO SUSR
875.0000 mg | Freq: Two times a day (BID) | ORAL | Status: DC
  Administered 2020-08-18 – 2020-08-20 (×4): 875 mg via ORAL
  Filled 2020-08-18: qty 17.5
  Filled 2020-08-18: qty 20
  Filled 2020-08-18: qty 17.5
  Filled 2020-08-18 (×2): qty 20

## 2020-08-18 NOTE — ED Notes (Addendum)
Pt spouse has arrived to room  Pt heart rate increased to 120-130's for approx 2 minutes - Admitting doctor at bedside - EKG order obtained  When asking pt if she had hx of elevated HR she pulled this nurse close and whispered "it's him" and when I ask who she pulled me closer and stated "my husband" ED secretary stated that pt was previously wandering in hall and stated that she did not want to return to room because her husband was in there  Pt has been requesting her husband during her time in the ED  Admitting doctor aware

## 2020-08-18 NOTE — ED Notes (Signed)
Pt assisted to toilet to void 

## 2020-08-18 NOTE — ED Notes (Signed)
Pt spouse heard at nurses station screaming at pt  Discussed with spouse that he could not behave in this manner while in the ED Charge RN and ED physician notified

## 2020-08-18 NOTE — ED Triage Notes (Signed)
Patient went to the bathroom, and then went and got her husband because there was blood in the toilet.  Patient states rectal bleeding.

## 2020-08-18 NOTE — ED Notes (Signed)
Pt assisted to toilet to void - pt spouse has returned to room Pt has become more disoriented as the day as gone by - At this time she is oriented to person and place but thinks that she has only been here 1 hour - Pt has been able to use call bell since being in this room but at this time is not aware of where it is or how to use it

## 2020-08-18 NOTE — H&P (Signed)
History and Physical    Ariana [name] TGG:[id number] DOB: 07-10-1933 DOA: 08/18/2020  Referring MD/NP/PA:   PCP: Idelle Crouch, MD   Patient coming from:  The patient is coming from home.  At baseline, pt is partially dependent for most of ADL.        Chief Complaint:   HPI: Ariana [name] is a 86 y.o. female with medical history significant of ITP, HLD, asthma, GERD, hypothyroidism, Alzheimer, LBBB, anemia, depression with anxiety, presents with rectal bleeding.   Per his husband at bedside, pt went to the restroom at about 11 PM last night. When she sat down, she passed a large amount of bright red blood per rectum, mixed with small clots. No abdominal pain, nausea, vomiting.  Patient denies chest pain, shortness breath. No dizziness.  Patient has cough with little mucus production.  No fever or chills.  Patient is taking amoxicillin which was started 3 days ago for coughing and congestion. No history of GI bleeding or diverticulosis in the past.  ED Course: pt was found to have hemoglobin 11.2 on 08/07/20 -->10.5 -->10.4, WBC 17.9, INR 1.0, negative Covid PCR, electrolytes renal function okay, temperature normal, blood pressure 180/84, heart rate 71, RR 15, oxygen saturation 98 percent on room air.  Patient is placed on MedSurg bed for observation.  Dr. Bonna Gains of GI is consulted.  CTA of abd/pelvis: 1. Marked sigmoid diverticulosis. No appreciable intraluminal contrast extravasation to suggest an active GI bleed. 2. Mild-to-moderate diffuse colorectal stool volume. No bowel obstruction or acute bowel inflammation. 3. Aortic Atherosclerosis (ICD10-I70.0).  Review of Systems:   General: no fevers, chills, no body weight gain, has fatigue HEENT: no blurry vision, hearing changes or sore throat Respiratory: no dyspnea, has coughing, no wheezing CV: no chest pain, no palpitations GI: no nausea, vomiting, abdominal pain, diarrhea, constipation. Has rectal  bleeding. GU: no dysuria, burning on urination, increased urinary frequency, hematuria  Ext: no leg edema Neuro: no unilateral weakness, numbness, or tingling, no vision change or hearing loss Skin: no rash, no skin tear. MSK: No muscle spasm, no deformity, no limitation of range of movement in spin Heme: No easy bruising.  Travel history: No recent long distant travel.  Allergy:  Allergies  Allergen Reactions  . Aspirin     Upset stomach  . Diazepam Itching    sneezing  . Other     seasonal allergies  . Tetanus Toxoids     Localized superficial swelling of skin  . Morphine Itching and Rash    Past Medical History:  Diagnosis Date  . Alzheimer disease ([place])   . Anemia   . Anxiety   . Asthma    WELL CONTROLLED  . Chronic back pain   . Depression   . Depression   . GERD (gastroesophageal reflux disease)   . Hiatal hernia   . Hypercholesteremia   . Hypothyroidism   . ITP (idiopathic thrombocytopenic purpura)    FOLLOWED BR DR Grayland Ormond  . LBBB (left bundle branch block)   . Osteoarthritis   . Osteoporosis   . Restless legs     Past Surgical History:  Procedure Laterality Date  . CARPAL TUNNEL RELEASE    . ESOPHAGOGASTRODUODENOSCOPY (EGD) WITH PROPOFOL N/A 09/10/2015   Procedure: ESOPHAGOGASTRODUODENOSCOPY (EGD) WITH PROPOFOL;  Surgeon: Lollie Sails, MD;  Location[facility] ENDOSCOPY;  Service: Endoscopy;  Laterality: N/A;  . IRRIGATION AND DEBRIDEMENT HEMATOMA Right 07/13/2018   Procedure: IRRIGATION AND DEBRIDEMENT HEMATOMA-RIGHT SHIN;  Surgeon: Herbert Pun, MD;  Location: [facility] ORS;  Service: General;  Laterality: Right;  . KYPHOPLASTY N/A 05/02/2019   Procedure: L1 KYPHOPLASTY;  Surgeon: Hessie Knows, MD;  Location: [facility] ORS;  Service: Orthopedics;  Laterality: N/A;  . LUMBAR [place]    . REVERSE SHOULDER ARTHROPLASTY Left 06/25/2020   Procedure: REVERSE SHOULDER ARTHROPLASTY;  Surgeon: Corky Mull, MD;  Location: [facility] ORS;  Service: Orthopedics;   Laterality: Left;  . SPLENECTOMY, PARTIAL    . TOTAL ABDOMINAL HYSTERECTOMY      Social History:  reports that she has never smoked. She has never used smokeless tobacco. She reports that she does not drink alcohol and does not use drugs.  Family History:  Family History  Problem Relation Age of Onset  . Stroke Mother      Prior to Admission medications   Medication Sig Start Date End Date Taking? Authorizing Provider  acetaminophen (TYLENOL) 325 MG tablet Take 2 tablets (650 mg total) by mouth every 6 (six) hours as needed for mild pain (or Fever >/= 101). 07/02/15  Yes Idelle Crouch, MD  albuterol (PROVENTIL HFA;VENTOLIN HFA) 108 (90 BASE) MCG/ACT inhaler Inhale 2 puffs into the lungs every 6 (six) hours as needed for wheezing or shortness of breath.   Yes [provider]  ALPRAZolam (XANAX) 0.5 MG tablet Take 1 tablet (0.5 mg total) by mouth 3 (three) times daily as needed for anxiety. 07/15/18  Yes Gladstone Lighter, MD  amoxicillin (AMOXIL) 875 MG tablet Take 875 mg by mouth 2 (two) times daily. 08/07/20  Yes [provider]  buPROPion (WELLBUTRIN XL) 150 MG 24 hr tablet Take 150 mg by mouth every morning.    Yes [provider]  busPIRone (BUSPAR) 15 MG tablet Take 15 mg by mouth 2 (two) times daily.  08/15/18  Yes [provider]  Calcium Carbonate-Vit D-Min (CALCIUM 600+D PLUS MINERALS) 600-400 MG-UNIT TABS Take 1 tablet by mouth daily.    Yes [provider]  Carboxymethylcellul-Glycerin (LUBRICATING EYE DROPS OP) Place 1 drop into both eyes daily as needed (dry eyes).   Yes [provider]  Cholecalciferol (VITAMIN D) 50 MCG (2000 UT) tablet Take 2,000 Units by mouth daily.    Yes [provider]  citalopram (CELEXA) 40 MG tablet Take 40 mg by mouth every morning.    Yes [provider]  Cyanocobalamin (RA VITAMIN B-12 TR) 1000 MCG TBCR Take 1,000 mcg by mouth daily.    Yes [provider]   donepezil (ARICEPT) 10 MG tablet Take 10 mg by mouth at bedtime.    Yes [provider]  feeding supplement, ENSURE ENLIVE, (ENSURE ENLIVE) LIQD Take 237 mLs by mouth 2 (two) times daily between meals. Patient taking differently: Take 237 mLs by mouth daily with breakfast.  02/18/20  Yes Nicole Kindred A, DO  fluticasone (FLONASE) 50 MCG/ACT nasal spray Place 2 sprays into both nostrils daily as needed for allergies.  03/03/17  Yes [provider]  Fluticasone-Salmeterol (ADVAIR) 250-50 MCG/DOSE AEPB Inhale 1 puff into the lungs 2 (two) times daily as needed (shortness of breath).    Yes [provider]  gabapentin (NEURONTIN) 300 MG capsule Take 300 mg by mouth 4 (four) times daily.   Yes [provider]  levothyroxine (SYNTHROID, LEVOTHROID) 50 MCG tablet Take 50 mcg by mouth daily before breakfast. Take 30 to 60 minutes before breakfast.   Yes [provider]  memantine (NAMENDA) 10 MG tablet Take 10 mg by mouth 2 (two) times daily.  Yes [provider]  montelukast (SINGULAIR) 10 MG tablet Take 10 mg by mouth at bedtime.    Yes [provider]  Multiple Vitamin (MULTI-VITAMIN) tablet Take 1 tablet by mouth daily.    Yes [provider]  Multiple Vitamins-Minerals (PRESERVISION AREDS 2) CAPS Take 1 capsule by mouth 2 (two) times a day.   Yes [provider]  NARCAN 4 MG/0.1ML LIQD nasal spray kit Place 1 spray into the nose once.  10/24/18  Yes [provider]  oxybutynin (DITROPAN) 5 MG tablet Take 5 mg by mouth 3 (three) times daily.   Yes [provider]  oxyCODONE (ROXICODONE) 5 MG immediate release tablet Take 1 tablet (5 mg total) by mouth every 4 (four) hours as needed for breakthrough pain. 06/25/20  Yes Poggi, Marshall Cork, MD  oxyCODONE-acetaminophen (PERCOCET) 10-325 MG tablet Take 1 tablet by mouth every 6 (six) hours as needed for moderate pain or severe pain. 02/09/20  Yes [provider]  pantoprazole (PROTONIX) 20 MG tablet Take 20 mg by mouth 2 (two) times daily.  07/24/15  Yes [provider]  pravastatin (PRAVACHOL) 20 MG tablet Take 20 mg by mouth at bedtime.    Yes [provider]  vitamin C (ASCORBIC ACID) 500 MG tablet Take 500 mg by mouth daily.   Yes [provider]  vitamin E (E-400) 400 UNIT capsule Take 400 Units by mouth daily.   Yes [provider]  lamoTRIgine (LAMICTAL) 25 MG tablet Take 50 mg by mouth at bedtime. 08/05/20   [provider]    Physical Exam: Vitals:   08/18/20 0915 08/18/20 1100 08/18/20 1110 08/18/20 1240  BP: (!) 179/80 (!) 158/128 (!) 180/84 (!) 183/78  Pulse: 71  84 67  Resp: $Remo'16  15 15  'kjCDZ$ Temp:      TempSrc:      SpO2: 98%  98% 99%  Weight:      Height:       General: Not in acute distress HEENT:       Eyes: PERRL, EOMI, no scleral icterus.       ENT: No discharge from the ears and nose, no pharynx injection, no tonsillar enlargement.        Neck: No JVD, no bruit, no mass felt. Heme: No neck lymph node enlargement. Cardiac: S1/S2, RRR, No murmurs, No gallops or rubs. Respiratory: No rales, wheezing, rhonchi or rubs. GI: Soft, nondistended, nontender, no rebound pain, no organomegaly, BS present. GU: No hematuria Ext: No pitting leg edema bilaterally. 1+DP/PT pulse bilaterally. Musculoskeletal: No joint deformities, No joint redness or warmth, no limitation of ROM in spin. Skin: No rashes.  Neuro: Alert, oriented X3, cranial nerves II-XII grossly intact, moves all extremities normally.  Psych: Patient is not psychotic, no suicidal or hemocidal ideation.  Labs on Admission: I have personally reviewed following labs and imaging studies  CBC: Recent Labs  Lab 08/18/20 0451 08/18/20 0921  WBC 19.6* 17.9*  HGB 10.5* 10.4*  HCT 33.6* 33.5*  MCV 90.1 89.3  PLT 200 831   Basic Metabolic Panel: Recent Labs  Lab 08/18/20 0451  NA 139  K 3.9  CL 104  CO2 28  GLUCOSE 92   BUN 19  CREATININE 0.92  CALCIUM 9.4   GFR: Estimated Creatinine Clearance: 41 mL/min (by C-G formula based on SCr of 0.92 mg/dL). Liver Function Tests: Recent Labs  Lab 08/18/20 0451  AST 32  ALT 18  ALKPHOS 102  BILITOT 0.6  PROT  6.3*  ALBUMIN 3.9   No results for input(s): LIPASE, AMYLASE in the last 168 hours. No results for input(s): AMMONIA in the last 168 hours. Coagulation Profile: Recent Labs  Lab 08/18/20 0921  INR 1.0   Cardiac Enzymes: No results for input(s): CKTOTAL, CKMB, CKMBINDEX, TROPONINI in the last 168 hours. BNP (last 3 results) No results for input(s): PROBNP in the last 8760 hours. HbA1C: No results for input(s): HGBA1C in the last 72 hours. CBG: No results for input(s): GLUCAP in the last 168 hours. Lipid Profile: No results for input(s): CHOL, HDL, LDLCALC, TRIG, CHOLHDL, LDLDIRECT in the last 72 hours. Thyroid Function Tests: No results for input(s): TSH, T4TOTAL, FREET4, T3FREE, THYROIDAB in the last 72 hours. Anemia Panel: No results for input(s): VITAMINB12, FOLATE, FERRITIN, TIBC, IRON, RETICCTPCT in the last 72 hours. Urine analysis:    Component Value Date/Time   COLORURINE YELLOW (A) 06/19/2020 1150   APPEARANCEUR HAZY (A) 06/19/2020 1150   APPEARANCEUR Clear 09/03/2014 1200   LABSPEC 1.017 06/19/2020 1150   LABSPEC 1.018 09/03/2014 1200   PHURINE 8.0 06/19/2020 1150   GLUCOSEU NEGATIVE 06/19/2020 1150   GLUCOSEU Negative 09/03/2014 1200   HGBUR NEGATIVE 06/19/2020 1150   BILIRUBINUR NEGATIVE 06/19/2020 1150   BILIRUBINUR Negative 09/03/2014 1200   KETONESUR NEGATIVE 06/19/2020 1150   PROTEINUR NEGATIVE 06/19/2020 1150   NITRITE NEGATIVE 06/19/2020 1150   LEUKOCYTESUR NEGATIVE 06/19/2020 1150   LEUKOCYTESUR Negative 09/03/2014 1200   Sepsis Labs: $RemoveBefo'@[id number]$ (procalcitonin:4,lacticidven:4) ) Recent Results (from the past 240 hour(s))  Respiratory Panel by RT PCR (Flu A&B, Covid) - Nasopharyngeal Swab     Status: None    Collection Time: 08/18/20 10:18 AM   Specimen: Nasopharyngeal Swab  Result Value Ref Range Status   SARS Coronavirus 2 by RT PCR NEGATIVE NEGATIVE Final    Comment: (NOTE) SARS-CoV-2 target nucleic acids are NOT DETECTED.  The SARS-CoV-2 RNA is generally detectable in upper respiratoy specimens during the acute phase of infection. The lowest concentration of SARS-CoV-2 viral copies this assay can detect is 131 copies/mL. A negative result does not preclude SARS-Cov-2 infection and should not be used as the sole basis for treatment or other patient management decisions. A negative result may occur with  improper specimen collection/handling, submission of specimen other than nasopharyngeal swab, presence of viral mutation(s) within the areas targeted by this assay, and inadequate number of viral copies (<131 copies/mL). A negative result must be combined with clinical observations, patient history, and epidemiological information. The expected result is Negative.  Fact Sheet for Patients:  PinkCheek.be  Fact Sheet for Healthcare Providers:  GravelBags.it  This test is no t yet approved or cleared by the [place] FDA and  has been authorized for detection and/or diagnosis of SARS-CoV-2 by FDA under an Emergency Use Authorization (EUA). This EUA will remain  in effect (meaning this test can be used) for the duration of the COVID-19 declaration under Section 564(b)(1) of the Act, 21 U.S.C. section 360bbb-3(b)(1), unless the authorization is terminated or revoked sooner.     Influenza A by PCR NEGATIVE NEGATIVE Final   Influenza B by PCR NEGATIVE NEGATIVE Final    Comment: (NOTE) The Xpert Xpress SARS-CoV-2/FLU/RSV assay is intended as an aid in  the diagnosis of influenza from Nasopharyngeal swab specimens and  should not be used as a sole basis for treatment. Nasal washings and  aspirates are unacceptable for Xpert  Xpress SARS-CoV-2/FLU/RSV  testing.  Fact Sheet for Patients: PinkCheek.be  Fact Sheet for Healthcare  Providers: GravelBags.it  This test is not yet approved or cleared by the [place] and  has been authorized for detection and/or diagnosis of SARS-CoV-2 by  FDA under an Emergency Use Authorization (EUA). This EUA will remain  in effect (meaning this test can be used) for the duration of the  Covid-19 declaration under Section 564(b)(1) of the Act, 21  U.S.C. section 360bbb-3(b)(1), unless the authorization is  terminated or revoked. Performed at [facility], [address], [place], [place] [address]      Radiological Exams on Admission: CT Angio Abd/Pel W and/or Wo Contrast  Result Date: 08/18/2020 CLINICAL DATA:  GI bleed with bloody stools.  Thrombocytopenia. EXAM: CTA ABDOMEN AND PELVIS WITHOUT AND WITH CONTRAST TECHNIQUE: Multidetector CT imaging of the abdomen and pelvis was performed using the standard protocol during bolus administration of intravenous contrast. Multiplanar reconstructed images and MIPs were obtained and reviewed to evaluate the vascular anatomy. CONTRAST:  166mL OMNIPAQUE IOHEXOL 350 MG/ML SOLN COMPARISON:  02/18/2017 CT abdomen/pelvis. FINDINGS: VASCULAR Aorta: Atherosclerotic nonaneurysmal abdominal aorta with no stenosis or dissection. Celiac: Patent without evidence of aneurysm, dissection, vasculitis or significant stenosis. SMA: Patent without evidence of aneurysm, dissection, vasculitis or significant stenosis. Renals: Two right and single left renal arteries are patent without evidence of aneurysm, dissection, vasculitis, fibromuscular dysplasia or significant stenosis. IMA: Patent without evidence of aneurysm, dissection, vasculitis or significant stenosis. Inflow: Patent without evidence of aneurysm, dissection, vasculitis or significant stenosis. Proximal Outflow: Bilateral  common femoral and visualized portions of the superficial and profunda femoral arteries are patent without evidence of aneurysm, dissection, vasculitis or significant stenosis. Veins: No obvious venous abnormality within the limitations of this arterial phase study. Review of the MIP images confirms the above findings. NON-VASCULAR Lower chest: No acute abnormality at the lung bases. Hepatobiliary: Normal liver with no liver mass. Normal gallbladder with no radiopaque cholelithiasis. No biliary ductal dilatation. Pancreas: Normal, with no mass or duct dilation. Spleen: Splenectomy.  Small splenules in the splenectomy bed. Adrenals/Urinary Tract: Normal adrenals. No renal masses. No hydronephrosis. Normal bladder. Stomach/Bowel: Normal non-distended stomach. Normal caliber small bowel with no small bowel wall thickening. Normal appendix. Marked sigmoid diverticulosis. Mild-to-moderate diffuse colorectal stool. No large bowel wall thickening or significant pericolonic fat stranding. No appreciable intraluminal contrast extravasation, noting that evaluation of large bowel lumen is limited by high density stool material present on the precontrast imaging sequence. Vascular/Lymphatic: Atherosclerotic nonaneurysmal abdominal aorta. Patent portal, splenic, hepatic and renal veins. No pathologically enlarged lymph nodes in the abdomen or pelvis. Reproductive: Status post hysterectomy, with no abnormal findings at the vaginal cuff. No adnexal mass. Other: No pneumoperitoneum, ascites or focal fluid collection. Musculoskeletal: No aggressive appearing focal osseous lesions. Moderate L1 vertebral compression fracture status post vertebroplasty. Status post bilateral posterior spinal fusion at L4-5. Marked lumbar degenerative disc disease. IMPRESSION: 1. Marked sigmoid diverticulosis. No appreciable intraluminal contrast extravasation to suggest an active GI bleed. 2. Mild-to-moderate diffuse colorectal stool volume. No bowel  obstruction or acute bowel inflammation. 3. Aortic Atherosclerosis (ICD10-I70.0). Electronically Signed   By: Ilona Sorrel M.D.   On: 08/18/2020 10:35     EKG: I have personally reviewed.  Sinus rhythm, QTC 484, heart rate 62, old left bundle blockade, poor R wave progression, LAD, anteroseptal infarction pattern.   Assessment/Plan Principal Problem:   Rectal bleeding Active Problems:   Acquired hypothyroidism   HLD (hyperlipidemia)   Asthma without status asthmaticus   Late onset Alzheimer's disease without behavioral disturbance ([place])  Idiopathic thrombocythemia ([facility])   Leukocytosis   Rectal bleeding and blood loss anemia:  Hgb 11.2 -->10.5 -->10.4.  Most likely due to diverticular bleeding.  CT angiogram showed marked sigmoid diverticulosis, no appreciable intraluminal contrast extravasation to suggest an active GI bleed. Dr. Bonna Gains of GI is consulted.  - will place in med-surg bed obs - GI consulted by Ed, will follow up recommendations - NPO now - IVF: 75 mL/hr of NS - Start IV pantoprazole 40 mg bid - Zofran IV for nausea - Avoid NSAIDs and SQ heparin - Maintain IV access (2 large bore IVs if possible). - Monitor closely and follow q6h cbc, transfuse as necessary, if Hgb<7.0 - LaB: INR, PTT and type screen  Asthma without status asthmaticus: pt is taking amoxicillin in the past 3 days of cough and and congestion.  No wheezing or rhonchi on auscultation, does not seem to have asthma exacerbation.  Patient has leukocytosis with WBC 17.9.  No fever.  Clinically not septic. -Continue amoxicillin -Bronchodilators and Singulair -As needed Mucinex -Follow-up chest x-ray  Acquired hypothyroidism -Synthroid  HLD (hyperlipidemia) -Pravastatin  Late onset Alzheimer's disease without behavioral disturbance ([place]):  -Continue donepezil and Namenda  Idiopathic thrombocythemia ([place]): Platelet 211. -Follow-up by CBC     DVT ppx: SCD Code Status: DNR per her husband who  is POA Family Communication:  Yes, patient's husbnd at bed side Disposition Plan:  Anticipate discharge back to previous environment Consults called:  Dr. Bonna Gains of GI  Admission status: Med-surg bed for obs   Status is: Observation  The patient remains OBS appropriate and will d/c before 2 midnights.  Dispo: The patient is from: Home              Anticipated d/c is to: Home              Anticipated d/c date is: 1 day              Patient currently is not medically stable to d/c.         Date of Service 08/18/2020    Ivor Costa [facility]   If 7PM-7AM, please contact night-coverage [email or web address] 08/18/2020, 12:59 PM

## 2020-08-18 NOTE — ED Provider Notes (Signed)
[facility] Emergency Department Provider Note  ____________________________________________   First MD Initiated Contact with Patient 08/18/20 [phone]     (approximate)  I have reviewed the triage vital signs and the nursing notes.   HISTORY  Chief Complaint Rectal Bleeding    HPI Ariana [name] is a 86 y.o. female  With h/o thrombocytopenia, dementia (mild), here with rectal bleeding. Pt reports that Ariana [name] sx started last night. Ariana [name] went to the restroom (to pee) at around 11 PM last night. When Ariana [name] sat down, Ariana [name] passed what Ariana [name] describes as a large amount of bright red blood per rectum. THere were small clots in it. Ariana [name] had no associated BM or urge to have a BM. Since then, Ariana [name] has not had a BM. Ariana [name] has had no associated abd pain or cramping. Ariana [name] reports that Ariana [name] has no known h/o GIB. Due to Ariana [name] low platelets, Ariana [name] became worried and presents for evaluation. No recent illness. No fever, chills. No recent med changes. Ariana [name] denies known h/o GIB or diverticulosis.        Past Medical History:  Diagnosis Date  . Alzheimer disease ([place])   . Anemia   . Anxiety   . Asthma    WELL CONTROLLED  . Chronic back pain   . Depression   . Depression   . GERD (gastroesophageal reflux disease)   . Hiatal hernia   . Hypercholesteremia   . Hypothyroidism   . ITP (idiopathic thrombocytopenic purpura)    FOLLOWED BR DR Grayland Ormond  . LBBB (left bundle branch block)   . Osteoarthritis   . Osteoporosis   . Restless legs     Patient Active Problem List   Diagnosis Date Noted  . Rectal bleeding 08/18/2020  . Leukocytosis 08/18/2020  . Failure to thrive in adult 02/16/2020  . Thrombocytopenia ([place]) 02/15/2020  . Pneumonia 07/10/2019  . Laceration of left lower leg 04/10/2019  . Chronic pain disorder 01/11/2019  . OSA (obstructive sleep apnea) 09/28/2018  . Ulcer of lower limb, right, limited to breakdown of skin ([place]) 07/22/2018  . Traumatic hematoma of  right lower leg 07/12/2018  . Asthma without status asthmaticus 11/25/2016  . Late onset Alzheimer's disease without behavioral disturbance ([place]) 12/30/2015  . Back muscle spasm 09/09/2015  . Clinical depression 04/12/2015  . Herpes zona 04/12/2015  . Chronic ITP (idiopathic thrombocytopenic purpura) ([facility]) 03/15/2015  . Chest pain 10/22/2014  . Breathlessness on exertion 10/22/2014  . Osteopenia 10/18/2014  . Benign essential hypertension 06/28/2014  . HLD (hyperlipidemia) 06/28/2014  . Idiopathic thrombocythemia ([place]) 06/28/2014  . Acquired hypothyroidism 06/04/2014  . Depressive disorder 06/04/2014  . Bursitis, trochanteric 03/27/2014  . DDD (degenerative disc disease), lumbar 02/27/2014  . Neuritis or radiculitis due to rupture of lumbar intervertebral disc 02/27/2014  . Lumbar radiculitis 02/27/2014    Past Surgical History:  Procedure Laterality Date  . CARPAL TUNNEL RELEASE    . ESOPHAGOGASTRODUODENOSCOPY (EGD) WITH PROPOFOL N/A 09/10/2015   Procedure: ESOPHAGOGASTRODUODENOSCOPY (EGD) WITH PROPOFOL;  Surgeon: Lollie Sails, MD;  Location[facility] END[facility]COPY;  Service: Endoscopy;  Laterality: N/A;  . IRRIGATION AND DEBRIDEMENT HEMATOMA Right 07/13/2018   Procedure: IRRIGATION AND DEBRIDEMENT HEMATOMA-RIGHT SHIN;  Surgeon: Herbert Pun, MD;  Location: [facility] ORS;  Service: General;  Laterality: Right;  . KYPHOPLASTY N/A 05/02/2019   Procedure: L1 KYPHOPLASTY;  Surgeon: Hessie Knows, MD;  Location: [facility] ORS;  Service: Orthopedics;  Laterality: N/A;  . LUMBAR [place]    . REVERSE SHOULDER ARTHROPLASTY Left 06/25/2020  Procedure: REVERSE SHOULDER ARTHROPLASTY;  Surgeon: Corky Mull, MD;  Location: [facility] ORS;  Service: Orthopedics;  Laterality: Left;  . SPLENECTOMY, PARTIAL    . TOTAL ABDOMINAL HYSTERECTOMY      Prior to Admission medications   Medication Sig Start Date End Date Taking? Authorizing Provider  acetaminophen (TYLENOL) 325 MG tablet Take 2 tablets (650  mg total) by mouth every 6 (six) hours as needed for mild pain (or Fever >/= 101). 07/02/15  Yes Idelle Crouch, MD  albuterol (PROVENTIL HFA;VENTOLIN HFA) 108 (90 BASE) MCG/ACT inhaler Inhale 2 puffs into the lungs every 6 (six) hours as needed for wheezing or shortness of breath.   Yes [provider]  ALPRAZolam (XANAX) 0.5 MG tablet Take 1 tablet (0.5 mg total) by mouth 3 (three) times daily as needed for anxiety. 07/15/18  Yes Gladstone Lighter, MD  amoxicillin (AMOXIL) 875 MG tablet Take 875 mg by mouth 2 (two) times daily. 08/07/20  Yes [provider]  buPROPion (WELLBUTRIN XL) 150 MG 24 hr tablet Take 150 mg by mouth every morning.    Yes [provider]  busPIRone (BUSPAR) 15 MG tablet Take 15 mg by mouth 2 (two) times daily.  08/15/18  Yes [provider]  Calcium Carbonate-Vit D-Min (CALCIUM 600+D PLUS MINERALS) 600-400 MG-UNIT TABS Take 1 tablet by mouth daily.    Yes [provider]  Carboxymethylcellul-Glycerin (LUBRICATING EYE DROPS OP) Place 1 drop into both eyes daily as needed (dry eyes).   Yes [provider]  Cholecalciferol (VITAMIN D) 50 MCG (2000 UT) tablet Take 2,000 Units by mouth daily.    Yes [provider]  citalopram (CELEXA) 40 MG tablet Take 40 mg by mouth every morning.    Yes [provider]  Cyanocobalamin (RA VITAMIN B-12 TR) 1000 MCG TBCR Take 1,000 mcg by mouth daily.    Yes [provider]  donepezil (ARICEPT) 10 MG tablet Take 10 mg by mouth at bedtime.    Yes [provider]  feeding supplement, ENSURE ENLIVE, (ENSURE ENLIVE) LIQD Take 237 mLs by mouth 2 (two) times daily between meals. Patient taking differently: Take 237 mLs by mouth daily with breakfast.  02/18/20  Yes Nicole Kindred A, DO  fluticasone (FLONASE) 50 MCG/ACT nasal spray Place 2 sprays into both nostrils daily as needed for allergies.  03/03/17  Yes [provider]  Fluticasone-Salmeterol (ADVAIR)  250-50 MCG/DOSE AEPB Inhale 1 puff into the lungs 2 (two) times daily as needed (shortness of breath).    Yes [provider]  gabapentin (NEURONTIN) 300 MG capsule Take 300 mg by mouth 4 (four) times daily.   Yes [provider]  levothyroxine (SYNTHROID, LEVOTHROID) 50 MCG tablet Take 50 mcg by mouth daily before breakfast. Take 30 to 60 minutes before breakfast.   Yes [provider]  memantine (NAMENDA) 10 MG tablet Take 10 mg by mouth 2 (two) times daily.    Yes [provider]  montelukast (SINGULAIR) 10 MG tablet Take 10 mg by mouth at bedtime.    Yes [provider]  Multiple Vitamin (MULTI-VITAMIN) tablet Take 1 tablet by mouth daily.    Yes [provider]  Multiple Vitamins-Minerals (PRESERVISION AREDS 2) CAPS Take 1 capsule by mouth 2 (two) times a day.   Yes [provider]  NARCAN 4 MG/0.1ML LIQD nasal spray kit Place 1 spray into the nose once.  10/24/18  Yes [provider]  oxybutynin (DITROPAN) 5 MG tablet Take 5 mg  by mouth 3 (three) times daily.   Yes [provider]  oxyCODONE (ROXICODONE) 5 MG immediate release tablet Take 1 tablet (5 mg total) by mouth every 4 (four) hours as needed for breakthrough pain. 06/25/20  Yes Poggi, Marshall Cork, MD  oxyCODONE-acetaminophen (PERCOCET) 10-325 MG tablet Take 1 tablet by mouth every 6 (six) hours as needed for moderate pain or severe pain. 02/09/20  Yes [provider]  pantoprazole (PROTONIX) 20 MG tablet Take 20 mg by mouth 2 (two) times daily.  07/24/15  Yes [provider]  pravastatin (PRAVACHOL) 20 MG tablet Take 20 mg by mouth at bedtime.    Yes [provider]  vitamin C (ASCORBIC ACID) 500 MG tablet Take 500 mg by mouth daily.   Yes [provider]  vitamin E (E-400) 400 UNIT capsule Take 400 Units by mouth daily.   Yes [provider]  lamoTRIgine (LAMICTAL) 25 MG tablet Take 50 mg by mouth at bedtime. 08/05/20    [provider]    Allergies Aspirin, Diazepam, Other, Tetanus toxoids, and Morphine  Family History  Problem Relation Age of Onset  . Stroke Mother     Social History Social History   Tobacco Use  . Smoking status: Never Smoker  . Smokeless tobacco: Never Used  Vaping Use  . Vaping Use: Never used  Substance Use Topics  . Alcohol use: No    Alcohol/week: 0.0 standard drinks  . Drug use: No    Review of Systems  Review of Systems  Constitutional: Negative for fatigue and fever.  HENT: Negative for congestion and sore throat.   Eyes: Negative for visual disturbance.  Respiratory: Negative for cough and shortness of breath.   Cardiovascular: Negative for chest pain.  Gastrointestinal: Positive for anal bleeding and blood in stool. Negative for abdominal pain, diarrhea, nausea and vomiting.  Genitourinary: Negative for flank pain.  Musculoskeletal: Negative for back pain and neck pain.  Skin: Negative for rash and wound.  Neurological: Negative for weakness.  All other systems reviewed and are negative.    ____________________________________________  PHYSICAL EXAM:      VITAL SIGNS: ED Triage Vitals  Enc Vitals Group     BP 08/18/20 0449 (!) 162/62     Pulse Rate 08/18/20 0449 76     Resp 08/18/20 0449 18     Temp 08/18/20 0449 98.3 F (36.8 C)     Temp Source 08/18/20 0449 Oral     SpO2 08/18/20 0449 99 %     Weight 08/18/20 0449 129 lb (58.5 kg)     Height 08/18/20 0449 5' 5" (1.651 m)     Head Circumference --      Peak Flow --      Pain Score 08/18/20 0454 0     Pain Loc --      Pain Edu? --      Excl. in Lane? --      Physical Exam Vitals and nursing note reviewed.  Constitutional:      General: Ariana [name] is not in acute distress.    Appearance: Ariana [name] is well-developed.  HENT:     Head: Normocephalic and atraumatic.  Eyes:     Conjunctiva/sclera: Conjunctivae normal.  Cardiovascular:     Rate and Rhythm: Normal rate and regular rhythm.      Heart sounds: Normal heart sounds. No murmur heard.  No friction rub.  Pulmonary:     Effort: Pulmonary effort is normal. No respiratory distress.  Breath sounds: Normal breath sounds. No wheezing or rales.  Abdominal:     General: There is no distension.     Palpations: Abdomen is soft.     Tenderness: There is no abdominal tenderness.  Genitourinary:    Comments: Grossly bloody stool in rectal vault, hemoccult positive. Musculoskeletal:     Cervical back: Neck supple.  Skin:    General: Skin is warm.     Capillary Refill: Capillary refill takes less than 2 seconds.  Neurological:     Mental Status: Ariana [name] is alert and oriented to person, place, and time.     Motor: No abnormal muscle tone.       ____________________________________________   LABS (all labs ordered are listed, but only abnormal results are displayed)  Labs Reviewed  COMPREHENSIVE METABOLIC PANEL - Abnormal; Notable for the following components:      Result Value   Total Protein 6.3 (*)    All other components within normal limits  CBC - Abnormal; Notable for the following components:   WBC 19.6 (*)    RBC 3.73 (*)    Hemoglobin 10.5 (*)    HCT 33.6 (*)    RDW 17.2 (*)    All other components within normal limits  CBC - Abnormal; Notable for the following components:   WBC 17.9 (*)    RBC 3.75 (*)    Hemoglobin 10.4 (*)    HCT 33.5 (*)    RDW 17.3 (*)    All other components within normal limits  RESPIRATORY PANEL BY RT PCR (FLU A&B, COVID)  CULTURE, BLOOD (ROUTINE X 2)  CULTURE, BLOOD (ROUTINE X 2)  EXPECTORATED SPUTUM ASSESSMENT W REFEX TO RESP CULTURE  PROTIME-INR  APTT  CBC  CBC  CBC  TYPE AND SCREEN    ____________________________________________  EKG:  ________________________________________  RADIOLOGY All imaging, including plain films, CT scans, and ultrasounds, independently reviewed by me, and interpretations confirmed via formal radiology reads.  ED MD interpretation:    CT A/P: Sigmoid diverticulosis, no evidence of active extravasation, no obstruction  Official radiology report(s): CT Angio Abd/Pel W and/or Wo Contrast  Result Date: 08/18/2020 CLINICAL DATA:  GI bleed with bloody stools.  Thrombocytopenia. EXAM: CTA ABDOMEN AND PELVIS WITHOUT AND WITH CONTRAST TECHNIQUE: Multidetector CT imaging of the abdomen and pelvis was performed using the standard protocol during bolus administration of intravenous contrast. Multiplanar reconstructed images and MIPs were obtained and reviewed to evaluate the vascular anatomy. CONTRAST:  121m OMNIPAQUE IOHEXOL 350 MG/ML SOLN COMPARISON:  02/18/2017 CT abdomen/pelvis. FINDINGS: VASCULAR Aorta: Atherosclerotic nonaneurysmal abdominal aorta with no stenosis or dissection. Celiac: Patent without evidence of aneurysm, dissection, vasculitis or significant stenosis. SMA: Patent without evidence of aneurysm, dissection, vasculitis or significant stenosis. Renals: Two right and single left renal arteries are patent without evidence of aneurysm, dissection, vasculitis, fibromuscular dysplasia or significant stenosis. IMA: Patent without evidence of aneurysm, dissection, vasculitis or significant stenosis. Inflow: Patent without evidence of aneurysm, dissection, vasculitis or significant stenosis. Proximal Outflow: Bilateral common femoral and visualized portions of the superficial and profunda femoral arteries are patent without evidence of aneurysm, dissection, vasculitis or significant stenosis. Veins: No obvious venous abnormality within the limitations of this arterial phase study. Review of the MIP images confirms the above findings. NON-VASCULAR Lower chest: No acute abnormality at the lung bases. Hepatobiliary: Normal liver with no liver mass. Normal gallbladder with no radiopaque cholelithiasis. No biliary ductal dilatation. Pancreas: Normal, with no mass or duct dilation. Spleen: Splenectomy.  Small splenules  in the splenectomy bed.  Adrenals/Urinary Tract: Normal adrenals. No renal masses. No hydronephrosis. Normal bladder. Stomach/Bowel: Normal non-distended stomach. Normal caliber small bowel with no small bowel wall thickening. Normal appendix. Marked sigmoid diverticulosis. Mild-to-moderate diffuse colorectal stool. No large bowel wall thickening or significant pericolonic fat stranding. No appreciable intraluminal contrast extravasation, noting that evaluation of large bowel lumen is limited by high density stool material present on the precontrast imaging sequence. Vascular/Lymphatic: Atherosclerotic nonaneurysmal abdominal aorta. Patent portal, splenic, hepatic and renal veins. No pathologically enlarged lymph nodes in the abdomen or pelvis. Reproductive: Status post hysterectomy, with no abnormal findings at the vaginal cuff. No adnexal mass. Other: No pneumoperitoneum, ascites or focal fluid collection. Musculoskeletal: No aggressive appearing focal osseous lesions. Moderate L1 vertebral compression fracture status post vertebroplasty. Status post bilateral posterior spinal fusion at L4-5. Marked lumbar degenerative disc disease. IMPRESSION: 1. Marked sigmoid diverticulosis. No appreciable intraluminal contrast extravasation to suggest an active GI bleed. 2. Mild-to-moderate diffuse colorectal stool volume. No bowel obstruction or acute bowel inflammation. 3. Aortic Atherosclerosis (ICD10-I70.0). Electronically Signed   By: Ilona Sorrel M.D.   On: 08/18/2020 10:35    ____________________________________________  PROCEDURES   Procedure(s) performed (including Critical Care):  Procedures  ____________________________________________  INITIAL IMPRESSION / MDM / Combine / ED COURSE  As part of my medical decision making, I reviewed the following data within the [place] notes reviewed and incorporated, Old chart reviewed, Notes from prior ED visits, and [place] Controlled Substance  Database       *Kenady Doxtater was evaluated in Emergency Department on 08/18/2020 for the symptoms described in the history of present illness. Ariana [name] was evaluated in the context of the global COVID-19 pandemic, which necessitated consideration that the patient might be at risk for infection with the SARS-CoV-2 virus that causes COVID-19. Institutional protocols and algorithms that pertain to the evaluation of patients at risk for COVID-19 are in a state of rapid change based on information released by regulatory bodies including the CDC and federal and state organizations. These policies and algorithms were followed during the patient's care in the ED.  Some ED evaluations and interventions may be delayed as a result of limited staffing during the pandemic.*     Medical Decision Making:  86 yo F here with blood in stool. On exam, pt with grossly bloody stool. H/o ITP and thrombocytopenia though Plt  200 here. Hgb is stable but decreased from baseline at 10.5. WBC 19.6. No fever or infectious sx. CT pending. Plan to admit.  CT scan shows diverticulosis.  Repeat hemoglobin is stable.  Covid negative.  Suspect diverticular bleed.  Will admit for observation and possible GI referral.  Patient monitored on telemetry with no evidence of ectopy or arrhythmia.    ____________________________________________  FINAL CLINICAL IMPRESSION(S) / ED DIAGNOSES  Final diagnoses:  Rectal bleeding  Lower GI bleed     MEDICATIONS GIVEN DURING THIS VISIT:  Medications  0.9 %  sodium chloride infusion (has no administration in time range)  albuterol (VENTOLIN HFA) 108 (90 Base) MCG/ACT inhaler 2 puff (has no administration in time range)  dextromethorphan-guaiFENesin (MUCINEX DM) 30-600 MG per 12 hr tablet 1 tablet (has no administration in time range)  pantoprazole (PROTONIX) injection 40 mg (has no administration in time range)  ondansetron (ZOFRAN) injection 4 mg (has no administration in time  range)  acetaminophen (TYLENOL) tablet 650 mg (has no administration in time range)  hydrALAZINE (APRESOLINE) injection 5 mg (has  no administration in time range)  iohexol (OMNIPAQUE) 350 MG/ML injection 100 mL (100 mLs Intravenous Contrast Given 08/18/20 0934)     ED Discharge Orders    None       Note:  This document was prepared using Dragon voice recognition software and may include unintentional dictation errors.   Duffy Bruce, MD 08/18/20 1326

## 2020-08-18 NOTE — ED Notes (Signed)
Advised nurse that patient has assigned bed 

## 2020-08-18 NOTE — ED Notes (Signed)
Message sent to admitting provider asking why blood cultures to be drawn - admitting provider messaged back and stated that he needed to discuss atb therapy with pt and then determine blood culture draw

## 2020-08-18 NOTE — ED Notes (Signed)
Pt reports hx of low platelets Pt reports that last night when she had a BM she noticed blood in her stool  Pt denies any pain at this time

## 2020-08-19 DIAGNOSIS — K625 Hemorrhage of anus and rectum: Secondary | ICD-10-CM

## 2020-08-19 LAB — CBC
HCT: 34.2 % — ABNORMAL LOW (ref 36.0–46.0)
HCT: 33.3 % — ABNORMAL LOW (ref 36.0–46.0)
HCT: 33.8 % — ABNORMAL LOW (ref 36.0–46.0)
HCT: 32.8 % — ABNORMAL LOW (ref 36.0–46.0)
Hemoglobin: 10.7 g/dL — ABNORMAL LOW (ref 12.0–15.0)
Hemoglobin: 10.6 g/dL — ABNORMAL LOW (ref 12.0–15.0)
Hemoglobin: 10.6 g/dL — ABNORMAL LOW (ref 12.0–15.0)
Hemoglobin: 10.3 g/dL — ABNORMAL LOW (ref 12.0–15.0)
MCH: 27.9 pg (ref 26.0–34.0)
MCH: 28.1 pg (ref 26.0–34.0)
MCH: 28 pg (ref 26.0–34.0)
MCH: 28.5 pg (ref 26.0–34.0)
MCHC: 31.3 g/dL (ref 30.0–36.0)
MCHC: 31.8 g/dL (ref 30.0–36.0)
MCHC: 31.4 g/dL (ref 30.0–36.0)
MCHC: 31.4 g/dL (ref 30.0–36.0)
MCV: 89.3 fL (ref 80.0–100.0)
MCV: 88.3 fL (ref 80.0–100.0)
MCV: 89.2 fL (ref 80.0–100.0)
MCV: 90.6 fL (ref 80.0–100.0)
Platelets: 226 10*3/uL (ref 150–400)
Platelets: 218 10*3/uL (ref 150–400)
Platelets: 232 10*3/uL (ref 150–400)
Platelets: 235 10*3/uL (ref 150–400)
RBC: 3.83 MIL/uL — ABNORMAL LOW (ref 3.87–5.11)
RBC: 3.77 MIL/uL — ABNORMAL LOW (ref 3.87–5.11)
RBC: 3.79 MIL/uL — ABNORMAL LOW (ref 3.87–5.11)
RBC: 3.62 MIL/uL — ABNORMAL LOW (ref 3.87–5.11)
RDW: 17.5 % — ABNORMAL HIGH (ref 11.5–15.5)
RDW: 17.9 % — ABNORMAL HIGH (ref 11.5–15.5)
RDW: 18 % — ABNORMAL HIGH (ref 11.5–15.5)
RDW: 17.8 % — ABNORMAL HIGH (ref 11.5–15.5)
WBC: 14.2 10*3/uL — ABNORMAL HIGH (ref 4.0–10.5)
WBC: 12.3 10*3/uL — ABNORMAL HIGH (ref 4.0–10.5)
WBC: 12.4 10*3/uL — ABNORMAL HIGH (ref 4.0–10.5)
WBC: 11.3 10*3/uL — ABNORMAL HIGH (ref 4.0–10.5)
nRBC: 0 % (ref 0.0–0.2)
nRBC: 0 % (ref 0.0–0.2)
nRBC: 0 % (ref 0.0–0.2)
nRBC: 0 % (ref 0.0–0.2)

## 2020-08-19 LAB — BASIC METABOLIC PANEL
Anion gap: 7 (ref 5–15)
BUN: 10 mg/dL (ref 8–23)
CO2: 27 mmol/L (ref 22–32)
Calcium: 8.6 mg/dL — ABNORMAL LOW (ref 8.9–10.3)
Chloride: 107 mmol/L (ref 98–111)
Creatinine, Ser: 0.88 mg/dL (ref 0.44–1.00)
GFR, Estimated: >60 mL/min (ref >60)
Glucose, Bld: 77 mg/dL (ref 70–99)
Potassium: 3.7 mmol/L (ref 3.5–5.1)
Sodium: 141 mmol/L (ref 135–145)

## 2020-08-19 LAB — GLUCOSE, CAPILLARY
Glucose-Capillary: 68 mg/dL — ABNORMAL LOW (ref 70–99)
Glucose-Capillary: 101 mg/dL — ABNORMAL HIGH (ref 70–99)
Glucose-Capillary: 77 mg/dL (ref 70–99)

## 2020-08-19 MED ORDER — DEXTROSE 50 % IV SOLN: 25.0000 mL | Freq: Once | INTRAVENOUS | Status: AC

## 2020-08-19 MED ORDER — DEXTROSE 50 % IV SOLN
INTRAVENOUS | Status: AC
  Administered 2020-08-19: 25 mL via INTRAVENOUS
  Filled 2020-08-19: qty 50

## 2020-08-19 NOTE — Progress Notes (Signed)
Mobility Specialist - Progress Note   08/19/20 1237  Mobility  Activity Ambulated in room;Ambulated in hall;Ambulated to bathroom  Level of Assistance Contact guard assist, steadying assist (min. Assist S2S)  Assistive Device Other (Comment) (pushed IV pole)  Distance Ambulated (ft) 240 ft  Mobility Response Tolerated well  Mobility performed by Mobility specialist  $Mobility charge 1 Mobility    Pt laying in bed upon arrival. Pt agreed to session. Pt able to get to EOB mod. Independently. Pt needed min. Assist S2S for boosting and steadying w/o AD. Pt unsteady after S2S. However, able to steady self once fully standing. CGA utilized for safety. Pt states she doesn't use a RW at baseline and doesn't want to use RW for this session. Pt ambulated to the bathroom w/ CGA. Pt able to perform personal hygiene after urinating. Pt needing min. Assist to S2S from toilet. Pt progressed to ambulate the hallway w/ CGA. Pt ambulated 240' total. No LOB noted t/o session. Pt had a slow but steady gait pattern. Pt had no c/o pain or SOB. O2 sat > 93%. Pt on RA. Overall, pt tolerated session very well. Pt pleasant. Pt left laying in bed w/ alarm set. Call bell and phone placed in reach. Nurse was notified.    Ariana [name] Mobility Specialist  08/19/20, 12:45 PM

## 2020-08-19 NOTE — Care Management Obs Status (Signed)
[place] NOTIFICATION   Patient Details  Name: Ariana [name] MRN: [id number] Date of Birth: 11-04-1932   Medicare Observation Status Notification Given:  Yes (Will print off for patient and husband to take home.)    Candie Chroman, LCSW 08/19/2020, 3:48 PM

## 2020-08-19 NOTE — Consult Note (Signed)
Jonathon Bellows , MD [address], [place], [place], [place], [address] [address], [place], [place], [place], [address] Phone: [phone]  Fax: [phone]  Consultation  Referring Provider:     Dr Priscella Mann Primary Care Physician:  Idelle Crouch, MD Primary Gastroenterologist:  None         Reason for Consultation:     Rectal bleeding   Date of Admission:  08/18/2020 Date of Consultation:  08/19/2020         HPI:   Ariana [name] is a 86 y.o. female has a prior history of GERD, ITP, asthma, hypothyroidism, Alzheimer's, anemia, depression presented with rectal bleeding.  Went to the restroom at 11 PM last night.   In the emergency room found to have a hemoglobin of 11.2 g a few weeks back and was down to 10.4 g.  White cell count was raised at 17.9.  She underwent a CT angiogram of the abdomen and pelvis which showed marked sigmoid diverticulosis.  Mild to moderate diffuse colorectal stool volume.  No bowel obstruction.  This morning hemoglobin is 10.6 g with an MCV of 88.3.  BMP demonstrates no elevated BUN/creatinine ratio.  She is being treated for an eczema exacerbation.  The patient is unable to give any history , she does not know why she is here, denies any pain or distress  Past Medical History:  Diagnosis Date  . Alzheimer disease ([place])   . Anemia   . Anxiety   . Asthma    WELL CONTROLLED  . Chronic back pain   . Depression   . Depression   . GERD (gastroesophageal reflux disease)   . Hiatal hernia   . Hypercholesteremia   . Hypothyroidism   . ITP (idiopathic thrombocytopenic purpura)    FOLLOWED BR DR Grayland Ormond  . LBBB (left bundle branch block)   . Osteoarthritis   . Osteoporosis   . Restless legs     Past Surgical History:  Procedure Laterality Date  . CARPAL TUNNEL RELEASE    . ESOPHAGOGASTRODUODENOSCOPY (EGD) WITH PROPOFOL N/A 09/10/2015   Procedure: ESOPHAGOGASTRODUODENOSCOPY (EGD) WITH PROPOFOL;  Surgeon: Lollie Sails, MD;   Location: [facility] ENDOSCOPY;  Service: Endoscopy;  Laterality: N/A;  . IRRIGATION AND DEBRIDEMENT HEMATOMA Right 07/13/2018   Procedure: IRRIGATION AND DEBRIDEMENT HEMATOMA-RIGHT SHIN;  Surgeon: Herbert Pun, MD;  Location: [facility] ORS;  Service: General;  Laterality: Right;  . KYPHOPLASTY N/A 05/02/2019   Procedure: L1 KYPHOPLASTY;  Surgeon: Hessie Knows, MD;  Location: [facility] ORS;  Service: Orthopedics;  Laterality: N/A;  . LUMBAR [place]    . REVERSE SHOULDER ARTHROPLASTY Left 06/25/2020   Procedure: REVERSE SHOULDER ARTHROPLASTY;  Surgeon: Corky Mull, MD;  Location: [facility] ORS;  Service: Orthopedics;  Laterality: Left;  . SPLENECTOMY, PARTIAL    . TOTAL ABDOMINAL HYSTERECTOMY      Prior to Admission medications   Medication Sig Start Date End Date Taking? Authorizing Provider  acetaminophen (TYLENOL) 325 MG tablet Take 2 tablets (650 mg total) by mouth every 6 (six) hours as needed for mild pain (or Fever >/= 101). 07/02/15  Yes Idelle Crouch, MD  albuterol (PROVENTIL HFA;VENTOLIN HFA) 108 (90 BASE) MCG/ACT inhaler Inhale 2 puffs into the lungs every 6 (six) hours as needed for wheezing or shortness of breath.   Yes [provider]  ALPRAZolam (XANAX) 0.5 MG tablet Take 1 tablet (0.5 mg total) by mouth 3 (three) times daily as needed for anxiety. 07/15/18  Yes Gladstone Lighter, MD  amoxicillin (AMOXIL) 875 MG tablet Take 875 mg by mouth 2 (two) times daily. 08/07/20  Yes [provider]  buPROPion (WELLBUTRIN XL) 150 MG 24 hr tablet Take 150 mg by mouth every morning.    Yes [provider]  busPIRone (BUSPAR) 15 MG tablet Take 15 mg by mouth 2 (two) times daily.  08/15/18  Yes [provider]  Calcium Carbonate-Vit D-Min (CALCIUM 600+D PLUS MINERALS) 600-400 MG-UNIT TABS Take 1 tablet by mouth daily.    Yes [provider]  Carboxymethylcellul-Glycerin (LUBRICATING EYE DROPS OP) Place 1 drop into both eyes daily as needed (dry eyes).    Yes [provider]  Cholecalciferol (VITAMIN D) 50 MCG (2000 UT) tablet Take 2,000 Units by mouth daily.    Yes [provider]  citalopram (CELEXA) 40 MG tablet Take 40 mg by mouth every morning.    Yes [provider]  Cyanocobalamin (RA VITAMIN B-12 TR) 1000 MCG TBCR Take 1,000 mcg by mouth daily.    Yes [provider]  donepezil (ARICEPT) 10 MG tablet Take 10 mg by mouth at bedtime.    Yes [provider]  feeding supplement, ENSURE ENLIVE, (ENSURE ENLIVE) LIQD Take 237 mLs by mouth 2 (two) times daily between meals. Patient taking differently: Take 237 mLs by mouth daily with breakfast.  02/18/20  Yes Nicole Kindred A, DO  fluticasone (FLONASE) 50 MCG/ACT nasal spray Place 2 sprays into both nostrils daily as needed for allergies.  03/03/17  Yes [provider]  Fluticasone-Salmeterol (ADVAIR) 250-50 MCG/DOSE AEPB Inhale 1 puff into the lungs 2 (two) times daily as needed (shortness of breath).    Yes [provider]  gabapentin (NEURONTIN) 300 MG capsule Take 300 mg by mouth 4 (four) times daily.   Yes [provider]  levothyroxine (SYNTHROID, LEVOTHROID) 50 MCG tablet Take 50 mcg by mouth daily before breakfast. Take 30 to 60 minutes before breakfast.   Yes [provider]  memantine (NAMENDA) 10 MG tablet Take 10 mg by mouth 2 (two) times daily.    Yes [provider]  montelukast (SINGULAIR) 10 MG tablet Take 10 mg by mouth at bedtime.    Yes [provider]  Multiple Vitamin (MULTI-VITAMIN) tablet Take 1 tablet by mouth daily.    Yes [provider]  Multiple Vitamins-Minerals (PRESERVISION AREDS 2) CAPS Take 1 capsule by mouth 2 (two) times a day.   Yes [provider]  NARCAN 4 MG/0.1ML LIQD nasal spray kit Place 1 spray into the nose once.  10/24/18  Yes [provider]  oxybutynin (DITROPAN) 5 MG tablet Take 5 mg by mouth 3 (three) times daily.   Yes [provider]  oxyCODONE (ROXICODONE) 5 MG immediate release tablet Take 1 tablet (5 mg total) by mouth every 4 (four) hours as needed for breakthrough pain. 06/25/20  Yes Poggi, Marshall Cork, MD  oxyCODONE-acetaminophen (PERCOCET) 10-325 MG tablet Take 1 tablet by mouth every 6 (six) hours as needed for moderate pain or severe pain. 02/09/20  Yes [provider]  pantoprazole (PROTONIX) 20 MG tablet Take 20 mg by mouth 2 (two) times daily.  07/24/15  Yes [provider]  pravastatin (PRAVACHOL) 20 MG tablet Take 20 mg by mouth at bedtime.    Yes [provider]  vitamin C (ASCORBIC ACID) 500 MG tablet Take 500 mg by mouth daily.   Yes [provider]  vitamin E (E-400) 400 UNIT capsule Take 400 Units by mouth daily.  Yes [provider]  lamoTRIgine (LAMICTAL) 25 MG tablet Take 50 mg by mouth at bedtime. 08/05/20   [provider]    Family History  Problem Relation Age of Onset  . Stroke Mother      Social History   Tobacco Use  . Smoking status: Never Smoker  . Smokeless tobacco: Never Used  Vaping Use  . Vaping Use: Never used  Substance Use Topics  . Alcohol use: No    Alcohol/week: 0.0 standard drinks  . Drug use: No    Allergies as of 08/18/2020 - Review Complete 08/18/2020  Allergen Reaction Noted  . Aspirin  02/06/2015  . Diazepam Itching 02/06/2015  . Other  02/06/2015  . Tetanus toxoids  02/06/2015  . Morphine Itching and Rash 02/06/2015    Review of Systems:    All systems reviewed and negative except where noted in HPI.   Physical Exam:  Vital signs in last 24 hours: Temp:  [97.9 F (36.6 C)-98.5 F (36.9 C)] 98 F (36.7 C) (11/08 0409) Pulse Rate:  [60-88] 84 (11/08 0409) Resp:  [11-30] 14 (11/08 0409) BP: (100-183)/(55-129) 107/82 (11/08 0409) SpO2:  [96 %-100 %] 96 % (11/08 0409) Last BM Date: 08/18/20 General:   Pleasant, cooperative in NAD Head:  Normocephalic and atraumatic. Eyes:   No icterus.    Conjunctiva pink. PERRLA. Ears:  Normal auditory acuity. Neck:  Supple; no masses or thyroidomegaly Lungs: Respirations even and unlabored. Lungs clear to auscultation bilaterally.   No wheezes, crackles, or rhonchi.  Heart:  Regular rate and rhythm;  Without murmur, clicks, rubs or gallops Abdomen:  Soft, nondistended, nontender. Normal bowel sounds. No appreciable masses or hepatomegaly.  No rebound or guarding.  Neurologic:  Alert and oriented x1;  movbing all 4 limbs  Skin:  Intact without significant lesions or rashes. Cervical Nodes:  No significant cervical adenopathy. Psych:  Alert and cooperative. Normal affect.  LAB RESULTS: Recent Labs    08/18/20 1833 08/19/20 0131 08/19/20 0525  WBC 15.2* 14.2* 12.3*  HGB 10.8* 10.7* 10.6*  HCT 35.0* 34.2* 33.3*  PLT 232 226 218   BMET Recent Labs    08/18/20 0451 08/19/20 0525  NA 139 141  K 3.9 3.7  CL 104 107  CO2 28 27  GLUCOSE 92 77  BUN 19 10  CREATININE 0.92 0.88  CALCIUM 9.4 8.6*   LFT Recent Labs    08/18/20 0451  PROT 6.3*  ALBUMIN 3.9  AST 32  ALT 18  ALKPHOS 102  BILITOT 0.6   PT/INR Recent Labs    08/18/20 0921  LABPROT 12.4  INR 1.0    STUDIES: DG Chest Port 1 View  Result Date: 08/18/2020 CLINICAL DATA:  Cough, blood in stool, asthma, Alzheimer's, GERD EXAM: PORTABLE CHEST 1 VIEW COMPARISON:  Portable exam 1301 hours compared to 02/18/2020 FINDINGS: Normal heart size, mediastinal contours, and pulmonary vascularity. Lungs clear. No acute infiltrate, pleural effusion, or pneumothorax. Reverse LEFT shoulder arthroplasty. Probable chronic RIGHT rotator cuff tear. No acute osseous findings. IMPRESSION: No acute abnormalities. Electronically Signed   By: Lavonia Dana M.D.   On: 08/18/2020 13:50   CT Angio Abd/Pel W and/or Wo Contrast  Result Date: 08/18/2020 CLINICAL DATA:  GI bleed with bloody stools.  Thrombocytopenia. EXAM: CTA ABDOMEN AND PELVIS WITHOUT AND WITH CONTRAST TECHNIQUE: Multidetector  CT imaging of the abdomen and pelvis was performed using the standard protocol during bolus administration of intravenous contrast. Multiplanar reconstructed images and MIPs were obtained and  reviewed to evaluate the vascular anatomy. CONTRAST:  182mL OMNIPAQUE IOHEXOL 350 MG/ML SOLN COMPARISON:  02/18/2017 CT abdomen/pelvis. FINDINGS: VASCULAR Aorta: Atherosclerotic nonaneurysmal abdominal aorta with no stenosis or dissection. Celiac: Patent without evidence of aneurysm, dissection, vasculitis or significant stenosis. SMA: Patent without evidence of aneurysm, dissection, vasculitis or significant stenosis. Renals: Two right and single left renal arteries are patent without evidence of aneurysm, dissection, vasculitis, fibromuscular dysplasia or significant stenosis. IMA: Patent without evidence of aneurysm, dissection, vasculitis or significant stenosis. Inflow: Patent without evidence of aneurysm, dissection, vasculitis or significant stenosis. Proximal Outflow: Bilateral common femoral and visualized portions of the superficial and profunda femoral arteries are patent without evidence of aneurysm, dissection, vasculitis or significant stenosis. Veins: No obvious venous abnormality within the limitations of this arterial phase study. Review of the MIP images confirms the above findings. NON-VASCULAR Lower chest: No acute abnormality at the lung bases. Hepatobiliary: Normal liver with no liver mass. Normal gallbladder with no radiopaque cholelithiasis. No biliary ductal dilatation. Pancreas: Normal, with no mass or duct dilation. Spleen: Splenectomy.  Small splenules in the splenectomy bed. Adrenals/Urinary Tract: Normal adrenals. No renal masses. No hydronephrosis. Normal bladder. Stomach/Bowel: Normal non-distended stomach. Normal caliber small bowel with no small bowel wall thickening. Normal appendix. Marked sigmoid diverticulosis. Mild-to-moderate diffuse colorectal stool. No large bowel wall thickening or  significant pericolonic fat stranding. No appreciable intraluminal contrast extravasation, noting that evaluation of large bowel lumen is limited by high density stool material present on the precontrast imaging sequence. Vascular/Lymphatic: Atherosclerotic nonaneurysmal abdominal aorta. Patent portal, splenic, hepatic and renal veins. No pathologically enlarged lymph nodes in the abdomen or pelvis. Reproductive: Status post hysterectomy, with no abnormal findings at the vaginal cuff. No adnexal mass. Other: No pneumoperitoneum, ascites or focal fluid collection. Musculoskeletal: No aggressive appearing focal osseous lesions. Moderate L1 vertebral compression fracture status post vertebroplasty. Status post bilateral posterior spinal fusion at L4-5. Marked lumbar degenerative disc disease. IMPRESSION: 1. Marked sigmoid diverticulosis. No appreciable intraluminal contrast extravasation to suggest an active GI bleed. 2. Mild-to-moderate diffuse colorectal stool volume. No bowel obstruction or acute bowel inflammation. 3. Aortic Atherosclerosis (ICD10-I70.0). Electronically Signed   By: Ilona Sorrel M.D.   On: 08/18/2020 10:35      Impression / Plan:   Tarrin Menn is a 86 y.o. y/o female admitted with rectal bleeding, painless.  CT angiogram demonstrated no active bleeding.  Diverticulosis of the sigmoid colon noted on CAT scan.  Very likely based on history is that this is a diverticular bleed.Patient unable to give any history ( thinks its 2015, [employer], does not know why she is here), need to discuss goals of care with family and what their expectations are, options are to do nothing watch and wait vs colonoscopy which may practically be hard to perform as she may not be able to complete the prep   1.  Monitor CBC and transfuse as needed.   Thank you for involving me in the care of this patient.      LOS: 0 days   Jonathon Bellows, MD  08/19/2020, 11:07 AM

## 2020-08-19 NOTE — Progress Notes (Signed)
PROGRESS NOTE    Lafonda Patron  OEU:[id number] DOB: 1933/10/02 DOA: 08/18/2020 PCP: Idelle Crouch, MD   Brief Narrative: 86 year old female patient medical history significant for ITP, hyperlipidemia, asthma, GERD, hypothyroidism who presents for painless rectal bleeding   patient reports 1 day prior to admission had a large amount of bright red blood per rectum mixed with a small clots.  Bowel movement was painless.  Patient was taken amoxicillin for URI type symptoms.  Patient has no history of C-scope or endoscopy in the past.  GI consulted on admission.  Communicated with Dr. Vicente Males  11/8: No further rectal bleeding noted since admission.  No belly pain.  Hemoglobin stable.   Assessment & Plan:   Principal Problem:   Rectal bleeding Active Problems:   Acquired hypothyroidism   HLD (hyperlipidemia)   Asthma without status asthmaticus   Late onset Alzheimer's disease without behavioral disturbance (HCC)   Idiopathic thrombocythemia (HCC)   Leukocytosis  Bright red blood per rectum Symptomatic blood loss anemia Suspected diverticular hemorrhage CT angio negative for acute bleed Demonstrates marked sigmoid diverticulosis Strong suspicion for diverticular type bleed Hemodynamically stable Plan: N.p.o. for now pending GI evaluation Maintenance IV fluids PPI twice daily Antiemetics as needed Avoid NSAIDs Transfuse as needed hemoglobin less than 7  Asthma, severity unknown Presentation inconsistent with asthma exacerbation Clinically nonseptic, no fevers Plan: Continue amoxicillin Bronchodilators As needed mucolytic's  Hypothyroidism Continue home Synthroid  Hyperlipidemia Continue statin  Alzheimer's dementia Continue donepezil and Namenda  ITP Platelet count 211 on admission Stable, follow CBC    DVT prophylaxis: SCDs Code Status: DNR Family Communication: Husband at bedside Disposition Plan: Status is: Observation  The patient will  require care spanning > 2 midnights and should be moved to inpatient because: Inpatient level of care appropriate due to severity of illness  Dispo: The patient is from: Home              Anticipated d/c is to: Home              Anticipated d/c date is: 1 day              Patient currently is not medically stable to d/c.   Symptomatic anemia, suspected diverticular bleed.  Hemodynamically stable.  Pending formal GI evaluation.  Anticipate discharge within 24 hours.   Consultants:   GI  Procedures:   None  Antimicrobials:   Amoxicillin   Subjective: Patient seen and examined.,  Stable, no distress.  Mild abdominal tenderness endorsed.  No other complaints.  Objective: Vitals:   08/18/20 2010 08/19/20 0014 08/19/20 0409 08/19/20 1130  BP: (!) 162/84 (!) 100/55 107/82 (!) 123/56  Pulse: 75 70 84 78  Resp: 18 17 14 18   Temp: 97.9 F (36.6 C) 98 F (36.7 C) 98 F (36.7 C) 98.4 F (36.9 C)  TempSrc: Oral Oral Oral Oral  SpO2: 98%  96% 96%  Weight:      Height:        Intake/Output Summary (Last 24 hours) at 08/19/2020 1212 Last data filed at 08/19/2020 0413 Gross per 24 hour  Intake 766.23 ml  Output --  Net 766.23 ml   Filed Weights   08/18/20 0449  Weight: 58.5 kg    Examination:  General exam: No acute distress.  Appears stated age Respiratory system: Clear to auscultation. Respiratory effort normal. Cardiovascular system: S1-S2 heard, regular rate and rhythm, 2/6 systolic murmur gastrointestinal system: Nondistended, normal bowel sounds, mild tender to palpation epigastrium Central  nervous system: Alert and oriented. No focal neurological deficits. Extremities: Symmetric 5 x 5 power. Skin: No rashes, lesions or ulcers Psychiatry: Judgement and insight appear normal. Mood & affect appropriate.     Data Reviewed: I have personally reviewed following labs and imaging studies  CBC: Recent Labs  Lab 08/18/20 0921 08/18/20 1350 08/18/20 1833  08/19/20 0131 08/19/20 0525  WBC 17.9* 15.0* 15.2* 14.2* 12.3*  HGB 10.4* 10.9* 10.8* 10.7* 10.6*  HCT 33.5* 35.0* 35.0* 34.2* 33.3*  MCV 89.3 89.3 89.1 89.3 88.3  PLT 211 221 232 226 263   Basic Metabolic Panel: Recent Labs  Lab 08/18/20 0451 08/19/20 0525  NA 139 141  K 3.9 3.7  CL 104 107  CO2 28 27  GLUCOSE 92 77  BUN 19 10  CREATININE 0.92 0.88  CALCIUM 9.4 8.6*   GFR: Estimated Creatinine Clearance: 42.1 mL/min (by C-G formula based on SCr of 0.88 mg/dL). Liver Function Tests: Recent Labs  Lab 08/18/20 0451  AST 32  ALT 18  ALKPHOS 102  BILITOT 0.6  PROT 6.3*  ALBUMIN 3.9   No results for input(s): LIPASE, AMYLASE in the last 168 hours. No results for input(s): AMMONIA in the last 168 hours. Coagulation Profile: Recent Labs  Lab 08/18/20 0921  INR 1.0   Cardiac Enzymes: No results for input(s): CKTOTAL, CKMB, CKMBINDEX, TROPONINI in the last 168 hours. BNP (last 3 results) No results for input(s): PROBNP in the last 8760 hours. HbA1C: No results for input(s): HGBA1C in the last 72 hours. CBG: Recent Labs  Lab 08/19/20 0740 08/19/20 0843 08/19/20 1127  GLUCAP 68* 101* 77   Lipid Profile: No results for input(s): CHOL, HDL, LDLCALC, TRIG, CHOLHDL, LDLDIRECT in the last 72 hours. Thyroid Function Tests: No results for input(s): TSH, T4TOTAL, FREET4, T3FREE, THYROIDAB in the last 72 hours. Anemia Panel: No results for input(s): VITAMINB12, FOLATE, FERRITIN, TIBC, IRON, RETICCTPCT in the last 72 hours. Sepsis Labs: No results for input(s): PROCALCITON, LATICACIDVEN in the last 168 hours.  Recent Results (from the past 240 hour(s))  Respiratory Panel by RT PCR (Flu A&B, Covid) - Nasopharyngeal Swab     Status: None   Collection Time: 08/18/20 10:18 AM   Specimen: Nasopharyngeal Swab  Result Value Ref Range Status   SARS Coronavirus 2 by RT PCR NEGATIVE NEGATIVE Final    Comment: (NOTE) SARS-CoV-2 target nucleic acids are NOT DETECTED.  The  SARS-CoV-2 RNA is generally detectable in upper respiratoy specimens during the acute phase of infection. The lowest concentration of SARS-CoV-2 viral copies this assay can detect is 131 copies/mL. A negative result does not preclude SARS-Cov-2 infection and should not be used as the sole basis for treatment or other patient management decisions. A negative result may occur with  improper specimen collection/handling, submission of specimen other than nasopharyngeal swab, presence of viral mutation(s) within the areas targeted by this assay, and inadequate number of viral copies (<131 copies/mL). A negative result must be combined with clinical observations, patient history, and epidemiological information. The expected result is Negative.  Fact Sheet for Patients:  PinkCheek.be  Fact Sheet for Healthcare Providers:  GravelBags.it  This test is no t yet approved or cleared by the [place] FDA and  has been authorized for detection and/or diagnosis of SARS-CoV-2 by FDA under an Emergency Use Authorization (EUA). This EUA will remain  in effect (meaning this test can be used) for the duration of the COVID-19 declaration under Section 564(b)(1) of the Act, 21 U.S.C.  section 360bbb-3(b)(1), unless the authorization is terminated or revoked sooner.     Influenza A by PCR NEGATIVE NEGATIVE Final   Influenza B by PCR NEGATIVE NEGATIVE Final    Comment: (NOTE) The Xpert Xpress SARS-CoV-2/FLU/RSV assay is intended as an aid in  the diagnosis of influenza from Nasopharyngeal swab specimens and  should not be used as a sole basis for treatment. Nasal washings and  aspirates are unacceptable for Xpert Xpress SARS-CoV-2/FLU/RSV  testing.  Fact Sheet for Patients: PinkCheek.be  Fact Sheet for Healthcare Providers: GravelBags.it  This test is not yet approved or cleared  by the [place] FDA and  has been authorized for detection and/or diagnosis of SARS-CoV-2 by  FDA under an Emergency Use Authorization (EUA). This EUA will remain  in effect (meaning this test can be used) for the duration of the  Covid-19 declaration under Section 564(b)(1) of the Act, 21  U.S.C. section 360bbb-3(b)(1), unless the authorization is  terminated or revoked. Performed at [facility], [address] [place], [place] [address]   Culture, blood (Routine X 2) w Reflex to ID Panel     Status: None (Preliminary result)   Collection Time: 08/18/20 11:43 AM   Specimen: BLOOD  Result Value Ref Range Status   Specimen Description BLOOD BLOOD RIGHT FOREARM  Final   Special Requests   Final    BOTTLES DRAWN AEROBIC AND ANAEROBIC Blood Culture adequate volume   Culture   Final    NO GROWTH < 24 HOURS Performed at [facility], [address] [place], [place] [address]    Report Status PENDING  Incomplete  Culture, blood (Routine X 2) w Reflex to ID Panel     Status: None (Preliminary result)   Collection Time: 08/18/20  1:50 PM   Specimen: BLOOD  Result Value Ref Range Status   Specimen Description BLOOD RFA  Final   Special Requests   Final    BOTTLES DRAWN AEROBIC AND ANAEROBIC Blood Culture adequate volume   Culture   Final    NO GROWTH < 24 HOURS Performed at [facility], [address], [place], [place] [address]    Report Status PENDING  Incomplete         Radiology Studies: DG Chest Port 1 View  Result Date: 08/18/2020 CLINICAL DATA:  Cough, blood in stool, asthma, Alzheimer's, GERD EXAM: PORTABLE CHEST 1 VIEW COMPARISON:  Portable exam 1301 hours compared to 02/18/2020 FINDINGS: Normal heart size, mediastinal contours, and pulmonary vascularity. Lungs clear. No acute infiltrate, pleural effusion, or pneumothorax. Reverse LEFT shoulder arthroplasty. Probable chronic RIGHT rotator cuff tear. No acute osseous findings.  IMPRESSION: No acute abnormalities. Electronically Signed   By: Lavonia Dana M.D.   On: 08/18/2020 13:50   CT Angio Abd/Pel W and/or Wo Contrast  Result Date: 08/18/2020 CLINICAL DATA:  GI bleed with bloody stools.  Thrombocytopenia. EXAM: CTA ABDOMEN AND PELVIS WITHOUT AND WITH CONTRAST TECHNIQUE: Multidetector CT imaging of the abdomen and pelvis was performed using the standard protocol during bolus administration of intravenous contrast. Multiplanar reconstructed images and MIPs were obtained and reviewed to evaluate the vascular anatomy. CONTRAST:  126mL OMNIPAQUE IOHEXOL 350 MG/ML SOLN COMPARISON:  02/18/2017 CT abdomen/pelvis. FINDINGS: VASCULAR Aorta: Atherosclerotic nonaneurysmal abdominal aorta with no stenosis or dissection. Celiac: Patent without evidence of aneurysm, dissection, vasculitis or significant stenosis. SMA: Patent without evidence of aneurysm, dissection, vasculitis or significant stenosis. Renals: Two right and single left renal arteries are patent without evidence of aneurysm,  dissection, vasculitis, fibromuscular dysplasia or significant stenosis. IMA: Patent without evidence of aneurysm, dissection, vasculitis or significant stenosis. Inflow: Patent without evidence of aneurysm, dissection, vasculitis or significant stenosis. Proximal Outflow: Bilateral common femoral and visualized portions of the superficial and profunda femoral arteries are patent without evidence of aneurysm, dissection, vasculitis or significant stenosis. Veins: No obvious venous abnormality within the limitations of this arterial phase study. Review of the MIP images confirms the above findings. NON-VASCULAR Lower chest: No acute abnormality at the lung bases. Hepatobiliary: Normal liver with no liver mass. Normal gallbladder with no radiopaque cholelithiasis. No biliary ductal dilatation. Pancreas: Normal, with no mass or duct dilation. Spleen: Splenectomy.  Small splenules in the splenectomy bed.  Adrenals/Urinary Tract: Normal adrenals. No renal masses. No hydronephrosis. Normal bladder. Stomach/Bowel: Normal non-distended stomach. Normal caliber small bowel with no small bowel wall thickening. Normal appendix. Marked sigmoid diverticulosis. Mild-to-moderate diffuse colorectal stool. No large bowel wall thickening or significant pericolonic fat stranding. No appreciable intraluminal contrast extravasation, noting that evaluation of large bowel lumen is limited by high density stool material present on the precontrast imaging sequence. Vascular/Lymphatic: Atherosclerotic nonaneurysmal abdominal aorta. Patent portal, splenic, hepatic and renal veins. No pathologically enlarged lymph nodes in the abdomen or pelvis. Reproductive: Status post hysterectomy, with no abnormal findings at the vaginal cuff. No adnexal mass. Other: No pneumoperitoneum, ascites or focal fluid collection. Musculoskeletal: No aggressive appearing focal osseous lesions. Moderate L1 vertebral compression fracture status post vertebroplasty. Status post bilateral posterior spinal fusion at L4-5. Marked lumbar degenerative disc disease. IMPRESSION: 1. Marked sigmoid diverticulosis. No appreciable intraluminal contrast extravasation to suggest an active GI bleed. 2. Mild-to-moderate diffuse colorectal stool volume. No bowel obstruction or acute bowel inflammation. 3. Aortic Atherosclerosis (ICD10-I70.0). Electronically Signed   By: Ilona Sorrel M.D.   On: 08/18/2020 10:35        Scheduled Meds: . amoxicillin  875 mg Oral BID  . vitamin C  500 mg Oral Daily  . buPROPion  150 mg Oral BH-q7a  . busPIRone  15 mg Oral BID  . calcium-vitamin D  1 tablet Oral Daily  . cholecalciferol  2,000 Units Oral Daily  . citalopram  40 mg Oral BH-q7a  . donepezil  10 mg Oral QHS  . gabapentin  300 mg Oral QID  . lamoTRIgine  50 mg Oral QHS  . levothyroxine  50 mcg Oral QAC breakfast  . memantine  10 mg Oral BID  . mometasone-formoterol  2  puff Inhalation BID  . montelukast  10 mg Oral QHS  . multivitamin with minerals  1 tablet Oral Daily  . oxybutynin  5 mg Oral TID  . pantoprazole (PROTONIX) IV  40 mg Intravenous Q12H  . pravastatin  20 mg Oral QHS  . vitamin B-12  1,000 mcg Oral Daily  . Vitamin E  400 Units Oral Daily   Continuous Infusions: . sodium chloride 75 mL/hr at 08/19/20 0413     LOS: 0 days    Time spent: 25 minutes    Sidney Ace, MD [facility] Pager [phone]  If 7PM-7AM, please contact night-coverage 08/19/2020, 12:12 PM

## 2020-08-20 DIAGNOSIS — K625 Hemorrhage of anus and rectum: Secondary | ICD-10-CM | POA: Diagnosis not present

## 2020-08-20 LAB — CBC
HCT: 35.5 % — ABNORMAL LOW (ref 36.0–46.0)
HCT: 34.2 % — ABNORMAL LOW (ref 36.0–46.0)
Hemoglobin: 10.8 g/dL — ABNORMAL LOW (ref 12.0–15.0)
Hemoglobin: 10.7 g/dL — ABNORMAL LOW (ref 12.0–15.0)
MCH: 27.3 pg (ref 26.0–34.0)
MCH: 28.2 pg (ref 26.0–34.0)
MCHC: 30.4 g/dL (ref 30.0–36.0)
MCHC: 31.3 g/dL (ref 30.0–36.0)
MCV: 89.6 fL (ref 80.0–100.0)
MCV: 90.2 fL (ref 80.0–100.0)
Platelets: 253 10*3/uL (ref 150–400)
Platelets: 261 10*3/uL (ref 150–400)
RBC: 3.96 MIL/uL (ref 3.87–5.11)
RBC: 3.79 MIL/uL — ABNORMAL LOW (ref 3.87–5.11)
RDW: 17.8 % — ABNORMAL HIGH (ref 11.5–15.5)
RDW: 17.8 % — ABNORMAL HIGH (ref 11.5–15.5)
WBC: 13.3 10*3/uL — ABNORMAL HIGH (ref 4.0–10.5)
WBC: 11.4 10*3/uL — ABNORMAL HIGH (ref 4.0–10.5)
nRBC: 0 % (ref 0.0–0.2)
nRBC: 0 % (ref 0.0–0.2)

## 2020-08-20 LAB — GLUCOSE, CAPILLARY: Glucose-Capillary: 83 mg/dL (ref 70–99)

## 2020-08-20 NOTE — Discharge Summary (Signed)
Physician Discharge Summary  Ariana [name] IWP:[id number] DOB: [birth year] DOA: 08/18/2020  PCP: Idelle Crouch, MD  Admit date: 08/18/2020 Discharge date: 08/20/2020  Admitted From: Home Disposition: Home with home health  Recommendations for Outpatient Follow-up:  1. Follow up with PCP in 1-2 weeks 2.   Home Health: Yes Equipment/Devices: No Discharge Condition: Stable CODE STATUS: Full Diet recommendation: Heart Healthy Brief/Interim Summary: 86 year old female patient medical history significant for ITP, hyperlipidemia, asthma, GERD, hypothyroidism who presents for painless rectal bleeding   patient reports 1 day prior to admission had a large amount of bright red blood per rectum mixed with a small clots.  Bowel movement was painless.  Patient was taken amoxicillin for URI type symptoms.  Patient has no history of C-scope or endoscopy in the past.  GI consulted on admission.  Communicated with Dr. Vicente Males  11/8: No further rectal bleeding noted since admission.  No belly pain.  Hemoglobin stable.  11/9: No further rectal bleeding.  Hemoglobin stable.  Patient had a brown bowel movement prior to discharge.  Considering patient's poor functional status, background dementia, poor predicted ability to tolerate bowel prep we will plan on discharge home at this time.  Bleed felt to be diverticular and likely self-limited.  Patient will follow with primary care post discharge.  Communicated with GI.  Discharge Diagnoses:  Principal Problem:   Rectal bleeding Active Problems:   Acquired hypothyroidism   HLD (hyperlipidemia)   Asthma without status asthmaticus   Late onset Alzheimer's disease without behavioral disturbance (HCC)   Idiopathic thrombocythemia (HCC)   Leukocytosis   Bright red blood per rectum Symptomatic blood loss anemia Suspected diverticular hemorrhage CT angio negative for acute bleed Demonstrates marked sigmoid diverticulosis Strong suspicion for  diverticular type bleed Hemodynamically stable GI evaluated patient Felt the patient was a poor candidate for colonoscopy given her background dementia and poor ability to tolerate prep Monitor patient in house overnight Hemoglobin stable Okay for discharge home Follow-up with PCP post discharge  Asthma, severity unknown Presentation inconsistent with asthma exacerbation Clinically nonseptic, no fevers Patient was on amoxicillin She completed course Outpatient follow-up  Hypothyroidism Continue home Synthroid  Hyperlipidemia Continue statin  Alzheimer's dementia Continue donepezil and Namenda  ITP Platelet count 211 on admission Stable, follow CBC No bruising or bleeding noted  Discharge Instructions  Discharge Instructions    Diet - low sodium heart healthy   Complete by: As directed    Increase activity slowly   Complete by: As directed    No wound care   Complete by: As directed      Allergies as of 08/20/2020      Reactions   Aspirin    Upset stomach   Diazepam Itching   sneezing   Other    seasonal allergies   Tetanus Toxoids    Localized superficial swelling of skin   Morphine Itching, Rash      Medication List    STOP taking these medications   amoxicillin 875 MG tablet Commonly known as: AMOXIL     TAKE these medications   acetaminophen 325 MG tablet Commonly known as: TYLENOL Take 2 tablets (650 mg total) by mouth every 6 (six) hours as needed for mild pain (or Fever >/= 101).   albuterol 108 (90 Base) MCG/ACT inhaler Commonly known as: VENTOLIN HFA Inhale 2 puffs into the lungs every 6 (six) hours as needed for wheezing or shortness of breath.   ALPRAZolam 0.5 MG tablet Commonly known as: XANAX Take 1  tablet (0.5 mg total) by mouth 3 (three) times daily as needed for anxiety.   buPROPion 150 MG 24 hr tablet Commonly known as: WELLBUTRIN XL Take 150 mg by mouth every morning.   busPIRone 15 MG tablet Commonly known as:  BUSPAR Take 15 mg by mouth 2 (two) times daily.   Calcium 600+D Plus Minerals 600-400 MG-UNIT Tabs Take 1 tablet by mouth daily.   citalopram 40 MG tablet Commonly known as: CELEXA Take 40 mg by mouth every morning.   donepezil 10 MG tablet Commonly known as: ARICEPT Take 10 mg by mouth at bedtime.   E-400 180 MG (400 UNITS) capsule Generic drug: vitamin E Take 400 Units by mouth daily.   feeding supplement Liqd Take 237 mLs by mouth 2 (two) times daily between meals. What changed: when to take this   fluticasone 50 MCG/ACT nasal spray Commonly known as: FLONASE Place 2 sprays into both nostrils daily as needed for allergies.   Fluticasone-Salmeterol 250-50 MCG/DOSE Aepb Commonly known as: ADVAIR Inhale 1 puff into the lungs 2 (two) times daily as needed (shortness of breath).   gabapentin 300 MG capsule Commonly known as: NEURONTIN Take 300 mg by mouth 4 (four) times daily.   lamoTRIgine 25 MG tablet Commonly known as: LAMICTAL Take 50 mg by mouth at bedtime.   levothyroxine 50 MCG tablet Commonly known as: SYNTHROID Take 50 mcg by mouth daily before breakfast. Take 30 to 60 minutes before breakfast.   LUBRICATING EYE DROPS OP Place 1 drop into both eyes daily as needed (dry eyes).   memantine 10 MG tablet Commonly known as: NAMENDA Take 10 mg by mouth 2 (two) times daily.   montelukast 10 MG tablet Commonly known as: SINGULAIR Take 10 mg by mouth at bedtime.   Multi-Vitamin tablet Take 1 tablet by mouth daily.   Narcan 4 MG/0.1ML Liqd nasal spray kit Generic drug: naloxone Place 1 spray into the nose once.   oxybutynin 5 MG tablet Commonly known as: DITROPAN Take 5 mg by mouth 3 (three) times daily.   oxyCODONE 5 MG immediate release tablet Commonly known as: Roxicodone Take 1 tablet (5 mg total) by mouth every 4 (four) hours as needed for breakthrough pain.   oxyCODONE-acetaminophen 10-325 MG tablet Commonly known as: PERCOCET Take 1 tablet by  mouth every 6 (six) hours as needed for moderate pain or severe pain.   pantoprazole 20 MG tablet Commonly known as: PROTONIX Take 20 mg by mouth 2 (two) times daily.   pravastatin 20 MG tablet Commonly known as: PRAVACHOL Take 20 mg by mouth at bedtime.   PreserVision AREDS 2 Caps Take 1 capsule by mouth 2 (two) times a day.   RA Vitamin B-12 TR 1000 MCG Tbcr Generic drug: Cyanocobalamin Take 1,000 mcg by mouth daily.   vitamin C 500 MG tablet Commonly known as: ASCORBIC ACID Take 500 mg by mouth daily.   Vitamin D 50 MCG (2000 UT) tablet Take 2,000 Units by mouth daily.       Follow-up Information    Idelle Crouch, MD. Schedule an appointment as soon as possible for a visit in 1 week(s).   Specialty: Internal Medicine Contact information: [place] [address] [place], [facility] Follow up.   Specialty: Home Health Services Why: They will follow up with you for your home health needs.             Allergies  Allergen Reactions  . Aspirin     Upset stomach  . Diazepam Itching    sneezing  . Other     seasonal allergies  . Tetanus Toxoids     Localized superficial swelling of skin  . Morphine Itching and Rash    Consultations:  GI   Procedures/Studies: DG Chest Port 1 View  Result Date: 08/18/2020 CLINICAL DATA:  Cough, blood in stool, asthma, Alzheimer's, GERD EXAM: PORTABLE CHEST 1 VIEW COMPARISON:  Portable exam 1301 hours compared to 02/18/2020 FINDINGS: Normal heart size, mediastinal contours, and pulmonary vascularity. Lungs clear. No acute infiltrate, pleural effusion, or pneumothorax. Reverse LEFT shoulder arthroplasty. Probable chronic RIGHT rotator cuff tear. No acute osseous findings. IMPRESSION: No acute abnormalities. Electronically Signed   By: Lavonia Dana M.D.   On: 08/18/2020 13:50   CT Angio Abd/Pel W and/or Wo Contrast  Result Date: 08/18/2020 CLINICAL DATA:  GI bleed with  bloody stools.  Thrombocytopenia. EXAM: CTA ABDOMEN AND PELVIS WITHOUT AND WITH CONTRAST TECHNIQUE: Multidetector CT imaging of the abdomen and pelvis was performed using the standard protocol during bolus administration of intravenous contrast. Multiplanar reconstructed images and MIPs were obtained and reviewed to evaluate the vascular anatomy. CONTRAST:  13mL OMNIPAQUE IOHEXOL 350 MG/ML SOLN COMPARISON:  02/18/2017 CT abdomen/pelvis. FINDINGS: VASCULAR Aorta: Atherosclerotic nonaneurysmal abdominal aorta with no stenosis or dissection. Celiac: Patent without evidence of aneurysm, dissection, vasculitis or significant stenosis. SMA: Patent without evidence of aneurysm, dissection, vasculitis or significant stenosis. Renals: Two right and single left renal arteries are patent without evidence of aneurysm, dissection, vasculitis, fibromuscular dysplasia or significant stenosis. IMA: Patent without evidence of aneurysm, dissection, vasculitis or significant stenosis. Inflow: Patent without evidence of aneurysm, dissection, vasculitis or significant stenosis. Proximal Outflow: Bilateral common femoral and visualized portions of the superficial and profunda femoral arteries are patent without evidence of aneurysm, dissection, vasculitis or significant stenosis. Veins: No obvious venous abnormality within the limitations of this arterial phase study. Review of the MIP images confirms the above findings. NON-VASCULAR Lower chest: No acute abnormality at the lung bases. Hepatobiliary: Normal liver with no liver mass. Normal gallbladder with no radiopaque cholelithiasis. No biliary ductal dilatation. Pancreas: Normal, with no mass or duct dilation. Spleen: Splenectomy.  Small splenules in the splenectomy bed. Adrenals/Urinary Tract: Normal adrenals. No renal masses. No hydronephrosis. Normal bladder. Stomach/Bowel: Normal non-distended stomach. Normal caliber small bowel with no small bowel wall thickening. Normal  appendix. Marked sigmoid diverticulosis. Mild-to-moderate diffuse colorectal stool. No large bowel wall thickening or significant pericolonic fat stranding. No appreciable intraluminal contrast extravasation, noting that evaluation of large bowel lumen is limited by high density stool material present on the precontrast imaging sequence. Vascular/Lymphatic: Atherosclerotic nonaneurysmal abdominal aorta. Patent portal, splenic, hepatic and renal veins. No pathologically enlarged lymph nodes in the abdomen or pelvis. Reproductive: Status post hysterectomy, with no abnormal findings at the vaginal cuff. No adnexal mass. Other: No pneumoperitoneum, ascites or focal fluid collection. Musculoskeletal: No aggressive appearing focal osseous lesions. Moderate L1 vertebral compression fracture status post vertebroplasty. Status post bilateral posterior spinal fusion at L4-5. Marked lumbar degenerative disc disease. IMPRESSION: 1. Marked sigmoid diverticulosis. No appreciable intraluminal contrast extravasation to suggest an active GI bleed. 2. Mild-to-moderate diffuse colorectal stool volume. No bowel obstruction or acute bowel inflammation. 3. Aortic Atherosclerosis (ICD10-I70.0). Electronically Signed   By: Ilona Sorrel M.D.   On: 08/18/2020 10:35    (Echo, Carotid, EGD, Colonoscopy, ERCP)    Subjective: Seen and examined the day of discharge.  Husband  at bedside.  Patient in no distress.  Had a brown bowel movement prior to discharge.  Hemoglobin stable.  Stable for discharge home.  Discharge Exam: Vitals:   08/20/20 0736 08/20/20 1125  BP: (!) 157/78 106/65  Pulse: 79 78  Resp: 18 16  Temp: 98.8 F (37.1 C) 98.6 F (37 C)  SpO2: 99% 99%   Vitals:   08/20/20 0034 08/20/20 0338 08/20/20 0736 08/20/20 1125  BP: (!) 151/58 (!) 145/56 (!) 157/78 106/65  Pulse: 67 (!) 55 79 78  Resp: $Remo'18 16 18 16  'aBsaK$ Temp: 97.6 F (36.4 C) 98.4 F (36.9 C) 98.8 F (37.1 C) 98.6 F (37 C)  TempSrc: Oral Oral Oral Oral   SpO2: 98% 100% 99% 99%  Weight:      Height:        General: Pt is alert, awake, not in acute distress Cardiovascular: RRR, S1/S2 +, no rubs, no gallops Respiratory: CTA bilaterally, no wheezing, no rhonchi Abdominal: Soft, NT, ND, bowel sounds + Extremities: no edema, no cyanosis    The results of significant diagnostics from this hospitalization (including imaging, microbiology, ancillary and laboratory) are listed below for reference.     Microbiology: Recent Results (from the past 240 hour(s))  Respiratory Panel by RT PCR (Flu A&B, Covid) - Nasopharyngeal Swab     Status: None   Collection Time: 08/18/20 10:18 AM   Specimen: Nasopharyngeal Swab  Result Value Ref Range Status   SARS Coronavirus 2 by RT PCR NEGATIVE NEGATIVE Final    Comment: (NOTE) SARS-CoV-2 target nucleic acids are NOT DETECTED.  The SARS-CoV-2 RNA is generally detectable in upper respiratoy specimens during the acute phase of infection. The lowest concentration of SARS-CoV-2 viral copies this assay can detect is 131 copies/mL. A negative result does not preclude SARS-Cov-2 infection and should not be used as the sole basis for treatment or other patient management decisions. A negative result may occur with  improper specimen collection/handling, submission of specimen other than nasopharyngeal swab, presence of viral mutation(s) within the areas targeted by this assay, and inadequate number of viral copies (<131 copies/mL). A negative result must be combined with clinical observations, patient history, and epidemiological information. The expected result is Negative.  Fact Sheet for Patients:  PinkCheek.be  Fact Sheet for Healthcare Providers:  GravelBags.it  This test is no t yet approved or cleared by the [place] FDA and  has been authorized for detection and/or diagnosis of SARS-CoV-2 by FDA under an Emergency Use Authorization  (EUA). This EUA will remain  in effect (meaning this test can be used) for the duration of the COVID-19 declaration under Section 564(b)(1) of the Act, 21 U.S.C. section 360bbb-3(b)(1), unless the authorization is terminated or revoked sooner.     Influenza A by PCR NEGATIVE NEGATIVE Final   Influenza B by PCR NEGATIVE NEGATIVE Final    Comment: (NOTE) The Xpert Xpress SARS-CoV-2/FLU/RSV assay is intended as an aid in  the diagnosis of influenza from Nasopharyngeal swab specimens and  should not be used as a sole basis for treatment. Nasal washings and  aspirates are unacceptable for Xpert Xpress SARS-CoV-2/FLU/RSV  testing.  Fact Sheet for Patients: PinkCheek.be  Fact Sheet for Healthcare Providers: GravelBags.it  This test is not yet approved or cleared by the [place] FDA and  has been authorized for detection and/or diagnosis of SARS-CoV-2 by  FDA under an Emergency Use Authorization (EUA). This EUA will remain  in effect (meaning this test can be used) for  the duration of the  Covid-19 declaration under Section 564(b)(1) of the Act, 21  U.S.C. section 360bbb-3(b)(1), unless the authorization is  terminated or revoked. Performed at [facility], [address] [place], [place] [address]   Culture, blood (Routine X 2) w Reflex to ID Panel     Status: None (Preliminary result)   Collection Time: 08/18/20 11:43 AM   Specimen: BLOOD  Result Value Ref Range Status   Specimen Description BLOOD BLOOD RIGHT FOREARM  Final   Special Requests   Final    BOTTLES DRAWN AEROBIC AND ANAEROBIC Blood Culture adequate volume   Culture   Final    NO GROWTH 2 DAYS Performed at [facility], [address], [place], [place] [address]    Report Status PENDING  Incomplete  Culture, blood (Routine X 2) w Reflex to ID Panel     Status: None (Preliminary result)   Collection Time: 08/18/20  1:50 PM   Specimen:  BLOOD  Result Value Ref Range Status   Specimen Description BLOOD RFA  Final   Special Requests   Final    BOTTLES DRAWN AEROBIC AND ANAEROBIC Blood Culture adequate volume   Culture   Final    NO GROWTH 2 DAYS Performed at [facility], [address] [place],  [address]    Report Status PENDING  Incomplete     Labs: BNP (last 3 results) No results for input(s): BNP in the last 8760 hours. Basic Metabolic Panel: Recent Labs  Lab 08/18/20 0451 08/19/20 0525  NA 139 141  K 3.9 3.7  CL 104 107  CO2 28 27  GLUCOSE 92 77  BUN 19 10  CREATININE 0.92 0.88  CALCIUM 9.4 8.6*   Liver Function Tests: Recent Labs  Lab 08/18/20 0451  AST 32  ALT 18  ALKPHOS 102  BILITOT 0.6  PROT 6.3*  ALBUMIN 3.9   No results for input(s): LIPASE, AMYLASE in the last 168 hours. No results for input(s): AMMONIA in the last 168 hours. CBC: Recent Labs  Lab 08/19/20 0525 08/19/20 1356 08/19/20 1904 08/20/20 0101 08/20/20 0527  WBC 12.3* 12.4* 11.3* 13.3* 11.4*  HGB 10.6* 10.6* 10.3* 10.8* 10.7*  HCT 33.3* 33.8* 32.8* 35.5* 34.2*  MCV 88.3 89.2 90.6 89.6 90.2  PLT 218 232 235 253 261   Cardiac Enzymes: No results for input(s): CKTOTAL, CKMB, CKMBINDEX, TROPONINI in the last 168 hours. BNP: Invalid input(s): POCBNP CBG: Recent Labs  Lab 08/19/20 0740 08/19/20 0843 08/19/20 1127 08/20/20 0727  GLUCAP 68* 101* 77 83   D-Dimer No results for input(s): DDIMER in the last 72 hours. Hgb A1c No results for input(s): HGBA1C in the last 72 hours. Lipid Profile No results for input(s): CHOL, HDL, LDLCALC, TRIG, CHOLHDL, LDLDIRECT in the last 72 hours. Thyroid function studies No results for input(s): TSH, T4TOTAL, T3FREE, THYROIDAB in the last 72 hours.  Invalid input(s): FREET3 Anemia work up No results for input(s): VITAMINB12, FOLATE, FERRITIN, TIBC, IRON, RETICCTPCT in the last 72 hours. Urinalysis    Component Value Date/Time   COLORURINE YELLOW (A)  06/19/2020 1150   APPEARANCEUR HAZY (A) 06/19/2020 1150   APPEARANCEUR Clear 09/03/2014 1200   LABSPEC 1.017 06/19/2020 1150   LABSPEC 1.018 09/03/2014 1200   PHURINE 8.0 06/19/2020 1150   GLUCOSEU NEGATIVE 06/19/2020 1150   GLUCOSEU Negative 09/03/2014 1200   HGBUR NEGATIVE 06/19/2020 1150   BILIRUBINUR NEGATIVE 06/19/2020 1150   BILIRUBINUR Negative 09/03/2014 1200   KETONESUR NEGATIVE 06/19/2020  [place] 06/19/2020 1150   NITRITE NEGATIVE 06/19/2020 [place] 06/19/2020 1150   LEUKOCYTESUR Negative 09/03/2014 1200   Sepsis Labs Invalid input(s): PROCALCITONIN,  WBC,  LACTICIDVEN Microbiology Recent Results (from the past 240 hour(s))  Respiratory Panel by RT PCR (Flu A&B, Covid) - Nasopharyngeal Swab     Status: None   Collection Time: 08/18/20 10:18 AM   Specimen: Nasopharyngeal Swab  Result Value Ref Range Status   SARS Coronavirus 2 by RT PCR NEGATIVE NEGATIVE Final    Comment: (NOTE) SARS-CoV-2 target nucleic acids are NOT DETECTED.  The SARS-CoV-2 RNA is generally detectable in upper respiratoy specimens during the acute phase of infection. The lowest concentration of SARS-CoV-2 viral copies this assay can detect is 131 copies/mL. A negative result does not preclude SARS-Cov-2 infection and should not be used as the sole basis for treatment or other patient management decisions. A negative result may occur with  improper specimen collection/handling, submission of specimen other than nasopharyngeal swab, presence of viral mutation(s) within the areas targeted by this assay, and inadequate number of viral copies (<131 copies/mL). A negative result must be combined with clinical observations, patient history, and epidemiological information. The expected result is Negative.  Fact Sheet for Patients:  PinkCheek.be  Fact Sheet for Healthcare Providers:  GravelBags.it  This  test is no t yet approved or cleared by the [place] FDA and  has been authorized for detection and/or diagnosis of SARS-CoV-2 by FDA under an Emergency Use Authorization (EUA). This EUA will remain  in effect (meaning this test can be used) for the duration of the COVID-19 declaration under Section 564(b)(1) of the Act, 21 U.S.C. section 360bbb-3(b)(1), unless the authorization is terminated or revoked sooner.     Influenza A by PCR NEGATIVE NEGATIVE Final   Influenza B by PCR NEGATIVE NEGATIVE Final    Comment: (NOTE) The Xpert Xpress SARS-CoV-2/FLU/RSV assay is intended as an aid in  the diagnosis of influenza from Nasopharyngeal swab specimens and  should not be used as a sole basis for treatment. Nasal washings and  aspirates are unacceptable for Xpert Xpress SARS-CoV-2/FLU/RSV  testing.  Fact Sheet for Patients: PinkCheek.be  Fact Sheet for Healthcare Providers: GravelBags.it  This test is not yet approved or cleared by the [place] FDA and  has been authorized for detection and/or diagnosis of SARS-CoV-2 by  FDA under an Emergency Use Authorization (EUA). This EUA will remain  in effect (meaning this test can be used) for the duration of the  Covid-19 declaration under Section 564(b)(1) of the Act, 21  U.S.C. section 360bbb-3(b)(1), unless the authorization is  terminated or revoked. Performed at [facility], [address] [place], [place] [address]   Culture, blood (Routine X 2) w Reflex to ID Panel     Status: None (Preliminary result)   Collection Time: 08/18/20 11:43 AM   Specimen: BLOOD  Result Value Ref Range Status   Specimen Description BLOOD BLOOD RIGHT FOREARM  Final   Special Requests   Final    BOTTLES DRAWN AEROBIC AND ANAEROBIC Blood Culture adequate volume   Culture   Final    NO GROWTH 2 DAYS Performed at [facility], [address] [place], [place] [address]     Report Status PENDING  Incomplete  Culture, blood (Routine X 2) w Reflex to ID Panel     Status: None (Preliminary result)   Collection Time: 08/18/20  1:50 PM   Specimen:  BLOOD  Result Value Ref Range Status   Specimen Description BLOOD RFA  Final   Special Requests   Final    BOTTLES DRAWN AEROBIC AND ANAEROBIC Blood Culture adequate volume   Culture   Final    NO GROWTH 2 DAYS Performed at [facility], [place], [place] [address]    Report Status PENDING  Incomplete     Time coordinating discharge: Over 30 minutes  SIGNED:   Sidney Ace, MD  [facility] 08/20/2020, 12:29 PM Pager   If 7PM-7AM, please contact night-coverage

## 2020-08-20 NOTE — Progress Notes (Signed)
Mobility Specialist - Progress Note   08/20/20 1400  Mobility  Activity Ambulated in hall  Level of Assistance Modified independent, requires aide device or extra time  Assistive Device None  Distance Ambulated (ft) 220 ft  Mobility Response Tolerated well  Mobility performed by Mobility specialist  $Mobility charge 1 Mobility    Pre-mobility: 74 HR, 97% SpO2 Post-mobility: 83 HR, 98% SpO2   Pt was sitting EOB with spouse present upon arrival. Pt agreed to session. Pt is modI in all transfers this date, including ambulation. Pt ambulated 220' in room/hallway with no LOB noted. CGA donned for safety precautions. No AD was used during ambulation. Pt denied weakness, dizziness, and SOB throughout session. Overall, pt tolerated well. Pt was left EOB with needs in reach.    Kathee Delton Mobility Specialist 08/20/20, 2:09 PM

## 2020-08-20 NOTE — Clinical Social Work Note (Signed)
Patient was active with [facility] for PT and speech prior to admission. They are able to add OT. No DME recommendations. Patient has orders to discharge home today.   CSW signing off.  Dayton Scrape, [place]

## 2020-08-20 NOTE — Evaluation (Signed)
Physical Therapy Evaluation Patient Details Name: Ariana [name] MRN: [id number] DOB: [birth year] Today's Date: 08/20/2020   History of Present Illness  Ariana [name] is a 86 y.o. female with medical history significant of ITP, HLD, asthma, GERD, hypothyroidism, Alzheimer, LBBB, anemia, depression with anxiety, presents with rectal bleeding. Per her husband at bedside, pt went to the restroom at about 11 PM last night. When she sat down, she passed a large amount of bright red blood per rectum, mixed with small clots. No abdominal pain, nausea, vomiting. Patient denies chest pain, shortness breath. No dizziness. Patient has cough with little mucus production. No fever or chills. Patient is taking amoxicillin which was started 3 days ago for coughing and congestion. No history of GI bleeding or diverticulosis in the past.  Clinical Impression  Patient is received up in the chair. Reports "I have made a big mess." Spilled her soft drink on the table and floor.  Assisted to clean floor and table. Patient performed sit to stand with mod independence. Ambulated with single hand held assist >300 feet. Patient is slightly unsteady, but no lob. She will continue to benefit from skilled PT while here to improve functional independence and safety.      Follow Up Recommendations No PT follow up;Supervision - Intermittent    Equipment Recommendations  None recommended by PT    Recommendations for Other Services       Precautions / Restrictions Precautions Precautions: Fall Restrictions Weight Bearing Restrictions: No      Mobility  Bed Mobility Overal bed mobility: Independent             General bed mobility comments: patient received in recliner and remained in recliner Patient Response: Cooperative  Transfers Overall transfer level: Modified independent Equipment used: None             General transfer comment: Pt reports does not use DME at  home  Ambulation/Gait Ambulation/Gait assistance: Min guard Gait Distance (Feet): 300 Feet Assistive device: 1 person hand held assist Gait Pattern/deviations: Step-through pattern;Decreased stride length Gait velocity: decr   General Gait Details: patient generally steady with gait, although tentative. Slower pace, not especially confident  Stairs            Wheelchair Mobility    Modified Rankin (Stroke Patients Only)       Balance Overall balance assessment: Needs assistance Sitting-balance support: Feet supported Sitting balance-Leahy Scale: Good     Standing balance support: Single extremity supported;During functional activity Standing balance-Leahy Scale: Good Standing balance comment: somewhat impulsive                             Pertinent Vitals/Pain Pain Assessment: No/denies pain    Home Living Family/patient expects to be discharged to:: Private residence Living Arrangements: Spouse/significant other Available Help at Discharge: Family;Available 24 hours/day Type of Home: Mobile home Home Access: Stairs to enter Entrance Stairs-Rails: Left Entrance Stairs-Number of Steps: 3 Home Layout: One level Home Equipment: Cane - single point;Walker - 2 wheels Additional Comments: Has SPC + walker at home, but does not use these.    Prior Function Level of Independence: Needs assistance   Gait / Transfers Assistance Needed: Patient has assistance of husband if needed  ADL's / Homemaking Assistance Needed: Pt able to dress, toilet, bath (using sponge bath) self. husband does all driving, shopping, cooking, cleaning  Comments: Pt and husband affirm she has had no falls in previous 12  months.     Hand Dominance        Extremity/Trunk Assessment   Upper Extremity Assessment Upper Extremity Assessment: Defer to OT evaluation    Lower Extremity Assessment Lower Extremity Assessment: Overall WFL for tasks assessed    Cervical / Trunk  Assessment Cervical / Trunk Assessment: Normal  Communication   Communication: No difficulties  Cognition Arousal/Alertness: Awake/alert Behavior During Therapy: WFL for tasks assessed/performed Overall Cognitive Status: History of cognitive impairments - at baseline                                 General Comments: forgetful      General Comments General comments (skin integrity, edema, etc.): skin dry and flaky    Exercises Other Exercises Other Exercises: Grooming, UB bathing, toileting, feeding. Educ re: role of OT, POC, DC recs, to both pt and husband   Assessment/Plan    PT Assessment Patient needs continued PT services  PT Problem List Decreased strength;Decreased activity tolerance;Decreased balance;Decreased safety awareness;Decreased cognition       PT Treatment Interventions Therapeutic activities;Gait training;Therapeutic exercise;Patient/family education;Stair training;Balance training;Functional mobility training    PT Goals (Current goals can be found in the Care Plan section)  Acute Rehab PT Goals Patient Stated Goal: to get home today PT Goal Formulation: With patient Time For Goal Achievement: 08/27/20 Potential to Achieve Goals: Good    Frequency Min 2X/week   Barriers to discharge        Co-evaluation               AM-PAC PT "6 Clicks" Mobility  Outcome Measure Help needed turning from your back to your side while in a flat bed without using bedrails?: None Help needed moving from lying on your back to sitting on the side of a flat bed without using bedrails?: None Help needed moving to and from a bed to a chair (including a wheelchair)?: A Little Help needed standing up from a chair using your arms (e.g., wheelchair or bedside chair)?: A Little Help needed to walk in hospital room?: A Little Help needed climbing 3-5 steps with a railing? : A Little 6 Click Score: 20    End of Session Equipment Utilized During Treatment:  Gait belt Activity Tolerance: Patient tolerated treatment well Patient left: in chair;with call bell/phone within reach;with chair alarm set Nurse Communication: Mobility status PT Visit Diagnosis: Muscle weakness (generalized) (M62.81)    Time: 1683-7290 PT Time Calculation (min) (ACUTE ONLY): 17 min   Charges:   PT Evaluation $PT Eval Moderate Complexity: 1 Mod PT Treatments $Gait Training: 8-22 mins        Taimane Stimmel, PT, GCS 08/20/20,11:27 AM

## 2020-08-20 NOTE — Evaluation (Signed)
Occupational Therapy Evaluation Patient Details Name: Ariana [name] MRN: [id number] DOB: 1933-08-08 Today's Date: 08/20/2020    History of Present Illness Ariana [name] is a 86 y.o. female with medical history significant of ITP, HLD, asthma, GERD, hypothyroidism, Alzheimer, LBBB, anemia, depression with anxiety, presents with rectal bleeding. Per her husband at bedside, pt went to the restroom at about 11 PM last night. When she sat down, she passed a large amount of bright red blood per rectum, mixed with small clots. No abdominal pain, nausea, vomiting. Patient denies chest pain, shortness breath. No dizziness. Patient has cough with little mucus production. No fever or chills. Patient is taking amoxicillin which was started 3 days ago for coughing and congestion. No history of GI bleeding or diverticulosis in the past.   Clinical Impression   Ariana [name] is pleasant and agreeable to OT today, stating she was eager to get out of bed. Husband also present in room. Pt and husband live in a mobile home, 3 STE, with 2 small dogs who do not need to be walked on leash. [name] reports managing all IADL -- driving, shopping, cleaning, etc., and that he feels overwhelmed with his caregiving responsibilities. They have no outside help, and, other than quick trips to grocery, are together 24/7 (Ariana [name] is home alone while her husband shops). She has had no falls in the previous 12 months, and does not use DME at home. She reports she almost never leaves their home, but would "like to get out more." She states that she does not use the shower, instead taking sponge baths. She reports that she would like to help with cooking at home (saying that her husband is "not a very good cook"), but is unsure how to go about doing so. During today's evaluation, [name] is able to feed, dress, groom, toilet herself, requiring SUPV for safety. She reports no pain but says that she has  been "low energy." She is able to identify that she is at the hospital, but does not know date or year. She states that she has been at the hospital for "6 or more days." During hospitalization, recommend ongoing OT to improve safety, endurance, generalized weakness. Following DC, recommend home health OT to address functional mobility, maximize pt safety, and relieve caregiver burden.   Follow Up Recommendations  Home health OT    Equipment Recommendations  None recommended by OT    Recommendations for Other Services       Precautions / Restrictions Precautions Precautions: Fall Restrictions Weight Bearing Restrictions: No      Mobility Bed Mobility Overal bed mobility: Independent             General bed mobility comments: Able to transfer supine<>sit swifty, smoothly, w/o assistance    Transfers Overall transfer level: Modified independent               General transfer comment: Pt reports does not use DME at home    Balance Overall balance assessment: Needs assistance Sitting-balance support: No upper extremity supported Sitting balance-Leahy Scale: Good     Standing balance support: No upper extremity supported;During functional activity Standing balance-Leahy Scale: Good Standing balance comment: somewhat impulsive                           ADL either performed or assessed with clinical judgement   ADL Overall ADL's : Needs assistance/impaired Eating/Feeding: Independent   Grooming: Standing;Supervision/safety  Upper Body Bathing: Modified independent;Sitting               Toilet Transfer: Supervision/safety   Toileting- Clothing Manipulation and Hygiene: Engineer, site Details (indicate cue type and reason): pt reports she does not use tub/shower at home, only sponge baths Functional mobility during ADLs: Min guard       Vision Patient Visual Report: No change from baseline       Perception      Praxis      Pertinent Vitals/Pain       Hand Dominance     Extremity/Trunk Assessment Upper Extremity Assessment Upper Extremity Assessment: Overall WFL for tasks assessed   Lower Extremity Assessment Lower Extremity Assessment: Overall WFL for tasks assessed   Cervical / Trunk Assessment Cervical / Trunk Assessment: Normal   Communication Communication Communication: No difficulties   Cognition Arousal/Alertness: Awake/alert Behavior During Therapy: WFL for tasks assessed/performed Overall Cognitive Status: History of cognitive impairments - at baseline                                     General Comments  skin dry and flaky    Exercises Other Exercises Other Exercises: Grooming, UB bathing, toileting, feeding. Educ re: role of OT, POC, DC recs, to both pt and husband   Shoulder Instructions      Home Living Family/patient expects to be discharged to:: Private residence Living Arrangements: Spouse/significant other Available Help at Discharge: Family Type of Home: Mobile home Home Access: Stairs to enter Entrance Stairs-Number of Steps: 3   [place]: One level     Bathroom Shower/Tub: Teacher, early years/pre: Handicapped height Bathroom Accessibility: No   Home Equipment: [place] - single point;Walker - 2 wheels   Additional Comments: Has SPC + walker at home, but does not use these.      Prior Functioning/Environment Level of Independence: Needs assistance    ADL's / Homemaking Assistance Needed: Pt able to dress, toilet, bath (using sponge bath) self. husband does all driving, shopping, cooking, cleaning   Comments: Pt and husband affirm she has had no falls in previous 12 months.        OT Problem List: Decreased strength;Decreased activity tolerance;Decreased safety awareness;Decreased cognition      OT Treatment/Interventions: Self-care/ADL training;Therapeutic activities;Balance training;Therapeutic  exercise;Cognitive remediation/compensation;Patient/family education    OT Goals(Current goals can be found in the care plan section) Acute Rehab OT Goals Patient Stated Goal: to get home today OT Goal Formulation: With patient Time For Goal Achievement: 09/03/20 Potential to Achieve Goals: Good ADL Goals Pt Will Perform Upper Body Bathing: with set-up;with supervision Pt Will Perform Lower Body Bathing: with set-up;with supervision Pt Will Perform Tub/Shower Transfer: with set-up;with min guard assist  OT Frequency: Min 1X/week   Barriers to D/C: Decreased caregiver support          Co-evaluation              AM-PAC OT "6 Clicks" Daily Activity     Outcome Measure Help from another person eating meals?: None Help from another person taking care of personal grooming?: None Help from another person toileting, which includes using toliet, bedpan, or urinal?: A Little Help from another person bathing (including washing, rinsing, drying)?: A Little Help from another person to put on and taking off regular upper body clothing?: None Help from another person to put on  and taking off regular lower body clothing?: A Little 6 Click Score: 21   End of Session Nurse Communication: Mobility status  Activity Tolerance: Patient tolerated treatment well Patient left: in bed;with nursing/sitter in room;with call bell/phone within reach  OT Visit Diagnosis: Muscle weakness (generalized) (M62.81);Other symptoms and signs involving cognitive function                Time: 5486-2824 OT Time Calculation (min): 24 min Charges:  OT General Charges $OT Visit: 1 Visit OT Evaluation $OT Eval Moderate Complexity: 1 Mod OT Treatments $Self Care/Home Management : 23-37 mins  Josiah Lobo, PhD, MS, OTR/L ascom [phone] 08/20/20, 10:00 AM

## 2020-08-20 NOTE — Discharge Instructions (Signed)
Gastrointestinal Bleeding Gastrointestinal (GI) bleeding is bleeding somewhere along the path that food travels through the body (digestive tract). This path is anywhere between the mouth and the opening of the butt (anus). You may have blood in your poop (stool) or have black poop. If you throw up (vomit), there may be blood in it. This condition can be mild, serious, or even life-threatening. If you have a lot of bleeding, you may need to stay in the hospital. What are the causes? This condition may be caused by:  Irritation and swelling of the esophagus (esophagitis). The esophagus is part of the body that moves food from your mouth to your stomach.  Swollen veins in the butt (hemorrhoids).  Areas of painful tearing in the opening of the butt (anal fissures). These are often caused by passing hard poop.  Pouches that form on the colon over time (diverticulosis).  Irritation and swelling (diverticulitis) in areas where pouches have formed on the colon.  Growths (polyps) or cancer. Colon cancer often starts out as growths that are not cancer.  Irritation of the stomach lining (gastritis).  Sores (ulcers) in the stomach. What increases the risk? You are more likely to develop this condition if you:  Have a certain type of infection in your stomach (Helicobacter pylori infection).  Take certain medicines.  Smoke.  Drink alcohol. What are the signs or symptoms? Common symptoms of this condition include:  Throwing up (vomiting) material that has bright red blood in it. It may look like coffee grounds.  Changes in your poop. The poop may: ? Have red blood in it. ? Be black, look like tar, and smell stronger than normal. ? Be red.  Pain or cramping in the belly (abdomen). How is this treated? Treatment for this condition depends on the cause of the bleeding. For example:  Sometimes, the bleeding can be stopped during a procedure that is done to find the problem (endoscopy  or colonoscopy).  Medicines can be used to: ? Help control irritation, swelling, or infection. ? Reduce acid in your stomach.  Certain problems can be treated with: ? Creams. ? Medicines that are put in the butt (suppositories). ? Warm baths.  Surgery is sometimes needed.  If you lose a lot of blood, you may need a blood transfusion. If bleeding is mild, you may be allowed to go home. If there is a lot of bleeding, you will need to stay in the hospital. Follow these instructions at home:   Take over-the-counter and prescription medicines only as told by your doctor.  Eat foods that have a lot of fiber in them. These foods include beans, whole grains, and fresh fruits and vegetables. You can also try eating 1-3 prunes each day.  Drink enough fluid to keep your pee (urine) pale yellow.  Keep all follow-up visits as told by your doctor. This is important. Contact a doctor if:  Your symptoms do not get better. Get help right away if:  Your bleeding does not stop.  You feel dizzy or you pass out (faint).  You feel weak.  You have very bad cramps in your back or belly.  You pass large clumps of blood (clots) in your poop.  Your symptoms are getting worse.  You have chest pain or fast heartbeats. Summary  GI bleeding is bleeding somewhere along the path that food travels through the body (digestive tract).  This bleeding can be caused by many things. Treatment depends on the cause of the bleeding.  Take medicines only as told by your doctor.  Keep all follow-up visits as told by your doctor. This is important. This information is not intended to replace advice given to you by your health care provider. Make sure you discuss any questions you have with your health care provider. Document Revised: 05/11/2018 Document Reviewed: 05/11/2018 Elsevier Patient Education  [place]. Diverticulitis  Diverticulitis is when small pockets in your large intestine (colon)  get infected or swollen. This causes stomach pain and watery poop (diarrhea). These pouches are called diverticula. They form in people who have a condition called diverticulosis. Follow these instructions at home: Medicines  Take over-the-counter and prescription medicines only as told by your doctor. These include: ? Antibiotics. ? Pain medicines. ? Fiber pills. ? Probiotics. ? Stool softeners.  Do not drive or use heavy machinery while taking prescription pain medicine.  If you were prescribed an antibiotic, take it as told. Do not stop taking it even if you feel better. General instructions   Follow a diet as told by your doctor.  When you feel better, your doctor may tell you to change your diet. You may need to eat a lot of fiber. Fiber makes it easier to poop (have bowel movements). Healthy foods with fiber include: ? Berries. ? Beans. ? Lentils. ? Green vegetables.  Exercise 3 or more times a week. Aim for 30 minutes each time. Exercise enough to sweat and make your heart beat faster.  Keep all follow-up visits as told. This is important. You may need to have an exam of the large intestine. This is called a colonoscopy. Contact a doctor if:  Your pain does not get better.  You have a hard time eating or drinking.  You are not pooping like normal. Get help right away if:  Your pain gets worse.  Your problems do not get better.  Your problems get worse very fast.  You have a fever.  You throw up (vomit) more than one time.  You have poop that is: ? Bloody. ? Black. ? Tarry. Summary  Diverticulitis is when small pockets in your large intestine (colon) get infected or swollen.  Take medicines only as told by your doctor.  Follow a diet as told by your doctor. This information is not intended to replace advice given to you by your health care provider. Make sure you discuss any questions you have with your health care provider. Document Revised:  09/10/2017 Document Reviewed: 10/15/2016 Elsevier Patient Education  [place].

## 2020-08-21 DIAGNOSIS — M5136 Other intervertebral disc degeneration, lumbar region: Secondary | ICD-10-CM | POA: Diagnosis not present

## 2020-08-21 DIAGNOSIS — Z96612 Presence of left artificial shoulder joint: Secondary | ICD-10-CM | POA: Diagnosis not present

## 2020-08-21 DIAGNOSIS — M19041 Primary osteoarthritis, right hand: Secondary | ICD-10-CM | POA: Diagnosis not present

## 2020-08-21 DIAGNOSIS — M19042 Primary osteoarthritis, left hand: Secondary | ICD-10-CM | POA: Diagnosis not present

## 2020-08-21 DIAGNOSIS — F0390 Unspecified dementia without behavioral disturbance: Secondary | ICD-10-CM | POA: Diagnosis not present

## 2020-08-21 DIAGNOSIS — I4891 Unspecified atrial fibrillation: Secondary | ICD-10-CM | POA: Diagnosis not present

## 2020-08-21 DIAGNOSIS — Z471 Aftercare following joint replacement surgery: Secondary | ICD-10-CM | POA: Diagnosis not present

## 2020-08-21 DIAGNOSIS — M5030 Other cervical disc degeneration, unspecified cervical region: Secondary | ICD-10-CM | POA: Diagnosis not present

## 2020-08-21 DIAGNOSIS — I10 Essential (primary) hypertension: Secondary | ICD-10-CM | POA: Diagnosis not present

## 2020-08-22 DIAGNOSIS — K625 Hemorrhage of anus and rectum: Secondary | ICD-10-CM | POA: Diagnosis not present

## 2020-08-22 DIAGNOSIS — D62 Acute posthemorrhagic anemia: Secondary | ICD-10-CM | POA: Diagnosis not present

## 2020-08-23 LAB — CULTURE, BLOOD (ROUTINE X 2)
Culture: NO GROWTH
Culture: NO GROWTH
Special Requests: ADEQUATE
Special Requests: ADEQUATE

## 2020-08-27 DIAGNOSIS — M19042 Primary osteoarthritis, left hand: Secondary | ICD-10-CM | POA: Diagnosis not present

## 2020-08-27 DIAGNOSIS — Z96612 Presence of left artificial shoulder joint: Secondary | ICD-10-CM | POA: Diagnosis not present

## 2020-08-27 DIAGNOSIS — F0390 Unspecified dementia without behavioral disturbance: Secondary | ICD-10-CM | POA: Diagnosis not present

## 2020-08-27 DIAGNOSIS — M5030 Other cervical disc degeneration, unspecified cervical region: Secondary | ICD-10-CM | POA: Diagnosis not present

## 2020-08-27 DIAGNOSIS — Z471 Aftercare following joint replacement surgery: Secondary | ICD-10-CM | POA: Diagnosis not present

## 2020-08-27 DIAGNOSIS — M19041 Primary osteoarthritis, right hand: Secondary | ICD-10-CM | POA: Diagnosis not present

## 2020-08-27 DIAGNOSIS — M5136 Other intervertebral disc degeneration, lumbar region: Secondary | ICD-10-CM | POA: Diagnosis not present

## 2020-08-27 DIAGNOSIS — I10 Essential (primary) hypertension: Secondary | ICD-10-CM | POA: Diagnosis not present

## 2020-08-27 DIAGNOSIS — I4891 Unspecified atrial fibrillation: Secondary | ICD-10-CM | POA: Diagnosis not present

## 2020-08-31 NOTE — Progress Notes (Signed)
[place]  [phone]  [phone]     Ariana [name] DOB: November 20, 1932  MR#: [id number]  QMG#:[id number]  Patient Care Team: Idelle Crouch, MD as PCP - General (Internal Medicine) Lloyd Huger, MD as Consulting Physician (Oncology)   CHIEF COMPLAINT: Chronic refractory ITP  INTERVAL HISTORY: Patient returns to clinic today for repeat laboratory work, further evaluation, and continuation of Nplate.  She has noticed increased bruising recently, but otherwise has felt well.  She has no further falls. She continues to have chronic weakness and fatigue.  She continues to have a poor memory. She has no neurologic complaints.  She denies any recent fevers or illnesses.  She denies any chest pain, shortness of breath, cough, or hemoptysis.  She denies any nausea, vomiting, constipation, or diarrhea.  She has no melena or hematochezia.  She has no urinary complaints.  Patient offers no further specific complaints today.  REVIEW OF SYSTEMS:   Review of Systems  Constitutional: Positive for malaise/fatigue. Negative for fever and weight loss.  HENT: Negative for congestion.   Respiratory: Negative.  Negative for cough, hemoptysis and shortness of breath.   Cardiovascular: Negative.  Negative for chest pain and leg swelling.  Gastrointestinal: Negative.  Negative for abdominal pain, blood in stool, diarrhea, melena and nausea.  Genitourinary: Negative.  Negative for dysuria and hematuria.  Musculoskeletal: Negative for back pain, falls and joint pain.  Skin: Negative.  Negative for rash.  Neurological: Positive for weakness. Negative for dizziness, sensory change, focal weakness and headaches.  Endo/Heme/Allergies: Bruises/bleeds easily.  Psychiatric/Behavioral: Positive for memory loss. Negative for depression. The patient is not nervous/anxious.     As per HPI. Otherwise, a complete review of systems is negative.  PAST MEDICAL  HISTORY: Past Medical History:  Diagnosis Date  . Alzheimer disease ([place])   . Anemia   . Anxiety   . Asthma    WELL CONTROLLED  . Chronic back pain   . Depression   . Depression   . GERD (gastroesophageal reflux disease)   . Hiatal hernia   . Hypercholesteremia   . Hypothyroidism   . ITP (idiopathic thrombocytopenic purpura)    FOLLOWED BR DR Grayland Ormond  . LBBB (left bundle branch block)   . Osteoarthritis   . Osteoporosis   . Restless legs     PAST SURGICAL HISTORY: Past Surgical History:  Procedure Laterality Date  . CARPAL TUNNEL RELEASE    . ESOPHAGOGASTRODUODENOSCOPY (EGD) WITH PROPOFOL N/A 09/10/2015   Procedure: ESOPHAGOGASTRODUODENOSCOPY (EGD) WITH PROPOFOL;  Surgeon: Lollie Sails, MD;  Location: [facility] ENDOSCOPY;  Service: Endoscopy;  Laterality: N/A;  . IRRIGATION AND DEBRIDEMENT HEMATOMA Right 07/13/2018   Procedure: IRRIGATION AND DEBRIDEMENT HEMATOMA-RIGHT SHIN;  Surgeon: Herbert Pun, MD;  Location: [facility] ORS;  Service: General;  Laterality: Right;  . KYPHOPLASTY N/A 05/02/2019   Procedure: L1 KYPHOPLASTY;  Surgeon: Hessie Knows, MD;  Location: [facility] ORS;  Service: Orthopedics;  Laterality: N/A;  . LUMBAR [place]    . REVERSE SHOULDER ARTHROPLASTY Left 06/25/2020   Procedure: REVERSE SHOULDER ARTHROPLASTY;  Surgeon: Corky Mull, MD;  Location: [facility] ORS;  Service: Orthopedics;  Laterality: Left;  . SPLENECTOMY, PARTIAL    . TOTAL ABDOMINAL HYSTERECTOMY      FAMILY HISTORY Family History  Problem Relation Age of Onset  . Stroke Mother     GYNECOLOGIC HISTORY:  No LMP recorded. Patient has had a hysterectomy.     ADVANCED DIRECTIVES:    HEALTH MAINTENANCE: Social History  Tobacco Use  . Smoking status: Never Smoker  . Smokeless tobacco: Never Used  Vaping Use  . Vaping Use: Never used  Substance Use Topics  . Alcohol use: No    Alcohol/week: 0.0 standard drinks  . Drug use: No     Allergies  Allergen Reactions  . Aspirin      Upset stomach  . Diazepam Itching    sneezing  . Other     seasonal allergies  . Tetanus Toxoids     Localized superficial swelling of skin  . Morphine Itching and Rash    Current Outpatient Medications  Medication Sig Dispense Refill  . acetaminophen (TYLENOL) 325 MG tablet Take 2 tablets (650 mg total) by mouth every 6 (six) hours as needed for mild pain (or Fever >/= 101). 100 tablet 0  . albuterol (PROVENTIL HFA;VENTOLIN HFA) 108 (90 BASE) MCG/ACT inhaler Inhale 2 puffs into the lungs every 6 (six) hours as needed for wheezing or shortness of breath.    . ALPRAZolam (XANAX) 0.5 MG tablet Take 1 tablet (0.5 mg total) by mouth 3 (three) times daily as needed for anxiety. 20 tablet 0  . buPROPion (WELLBUTRIN XL) 150 MG 24 hr tablet Take 150 mg by mouth every morning.     . busPIRone (BUSPAR) 15 MG tablet Take 15 mg by mouth 2 (two) times daily.     . Calcium Carbonate-Vit D-Min (CALCIUM 600+D PLUS MINERALS) 600-400 MG-UNIT TABS Take 1 tablet by mouth daily.     . Carboxymethylcellul-Glycerin (LUBRICATING EYE DROPS OP) Place 1 drop into both eyes daily as needed (dry eyes).    . Cholecalciferol (VITAMIN D) 50 MCG (2000 UT) tablet Take 2,000 Units by mouth daily.     . citalopram (CELEXA) 40 MG tablet Take 40 mg by mouth every morning.     . Cyanocobalamin (RA VITAMIN B-12 TR) 1000 MCG TBCR Take 1,000 mcg by mouth daily.     Marland Kitchen donepezil (ARICEPT) 10 MG tablet Take 10 mg by mouth at bedtime.     . feeding supplement, ENSURE ENLIVE, (ENSURE ENLIVE) LIQD Take 237 mLs by mouth 2 (two) times daily between meals. (Patient taking differently: Take 237 mLs by mouth daily with breakfast. ) 237 mL 12  . fluticasone (FLONASE) 50 MCG/ACT nasal spray Place 2 sprays into both nostrils daily as needed for allergies.     . Fluticasone-Salmeterol (ADVAIR) 250-50 MCG/DOSE AEPB Inhale 1 puff into the lungs 2 (two) times daily as needed (shortness of breath).     . gabapentin (NEURONTIN) 300 MG capsule Take  300 mg by mouth 4 (four) times daily.    Marland Kitchen lamoTRIgine (LAMICTAL) 25 MG tablet Take 50 mg by mouth at bedtime.    Marland Kitchen levothyroxine (SYNTHROID, LEVOTHROID) 50 MCG tablet Take 50 mcg by mouth daily before breakfast. Take 30 to 60 minutes before breakfast.    . memantine (NAMENDA) 10 MG tablet Take 10 mg by mouth 2 (two) times daily.     . montelukast (SINGULAIR) 10 MG tablet Take 10 mg by mouth at bedtime.     . Multiple Vitamin (MULTI-VITAMIN) tablet Take 1 tablet by mouth daily.     . Multiple Vitamins-Minerals (PRESERVISION AREDS 2) CAPS Take 1 capsule by mouth 2 (two) times a day.    . oxybutynin (DITROPAN) 5 MG tablet Take 5 mg by mouth 3 (three) times daily.    Marland Kitchen oxyCODONE (ROXICODONE) 5 MG immediate release tablet Take 1 tablet (5 mg total) by mouth every 4 (four)  hours as needed for breakthrough pain. 40 tablet 0  . oxyCODONE-acetaminophen (PERCOCET) 10-325 MG tablet Take 1 tablet by mouth every 6 (six) hours as needed for moderate pain or severe pain.    . pantoprazole (PROTONIX) 20 MG tablet Take 20 mg by mouth 2 (two) times daily.     . pravastatin (PRAVACHOL) 20 MG tablet Take 20 mg by mouth at bedtime.     . vitamin C (ASCORBIC ACID) 500 MG tablet Take 500 mg by mouth daily.    . vitamin E (E-400) 400 UNIT capsule Take 400 Units by mouth daily.    Marland Kitchen NARCAN 4 MG/0.1ML LIQD nasal spray kit Place 1 spray into the nose once.      No current facility-administered medications for this visit.   Facility-Administered Medications Ordered in Other Visits  Medication Dose Route Frequency Provider Last Rate Last Admin  . romiPLOStim (NPLATE) injection 590 mcg  10 mcg/kg Subcutaneous Once Lloyd Huger, MD        OBJECTIVE: BP (!) 132/56 (BP Location: Left Arm, Patient Position: Sitting)   Pulse (!) 54   Temp 97.7 F (36.5 C) (Tympanic)   Resp 18   Wt 130 lb 1.6 oz (59 kg)   SpO2 100%   BMI 21.65 kg/m    Body mass index is 21.65 kg/m.    ECOG FS:1 - Symptomatic but completely  ambulatory  General: Thin, no acute distress. Eyes: Pink conjunctiva, anicteric sclera. HEENT: Normocephalic, moist mucous membranes. Lungs: No audible wheezing or coughing. Heart: Regular rate and rhythm. Abdomen: Soft, nontender, no obvious distention. Musculoskeletal: No edema, cyanosis, or clubbing. Neuro: Alert, answering all questions appropriately. Cranial nerves grossly intact. Skin: No rashes or petechiae noted. Psych: Normal affect.   LAB RESULTS:  Appointment on 09/04/2020  Component Date Value Ref Range Status  . WBC 09/04/2020 10.8* 4.0 - 10.5 K/uL Final  . RBC 09/04/2020 3.86* 3.87 - 5.11 MIL/uL Final  . Hemoglobin 09/04/2020 10.9* 12.0 - 15.0 g/dL Final  . HCT 09/04/2020 34.7* 36 - 46 % Final  . MCV 09/04/2020 89.9  80.0 - 100.0 fL Final  . MCH 09/04/2020 28.2  26.0 - 34.0 pg Final  . MCHC 09/04/2020 31.4  30.0 - 36.0 g/dL Final  . RDW 09/04/2020 17.0* 11.5 - 15.5 % Final  . Platelets 09/04/2020 20* 150 - 400 K/uL Final   Comment: Immature Platelet Fraction may be clinically indicated, consider ordering this additional test [id number] THIS CRITICAL RESULT HAS VERIFIED AND BEEN CALLED TO DR.Anyla Israelson BY CHASE ISLEY ON 11 24 2021 AT 1115, AND HAS BEEN READ BACK.    Marland Kitchen nRBC 09/04/2020 0.0  0.0 - 0.2 % Final  . Neutrophils Relative % 09/04/2020 56  % Final  . Neutro Abs 09/04/2020 6.1  1.7 - 7.7 K/uL Final  . Lymphocytes Relative 09/04/2020 29  % Final  . Lymphs Abs 09/04/2020 3.2  0.7 - 4.0 K/uL Final  . Monocytes Relative 09/04/2020 10  % Final  . Monocytes Absolute 09/04/2020 1.1* 0.1 - 1.0 K/uL Final  . Eosinophils Relative 09/04/2020 3  % Final  . Eosinophils Absolute 09/04/2020 0.3  0.0 - 0.5 K/uL Final  . Basophils Relative 09/04/2020 1  % Final  . Basophils Absolute 09/04/2020 0.1  0.0 - 0.1 K/uL Final  . RBC Morphology 09/04/2020 MORPHOLOGY UNREMARKABLE   Corrected  . Smear Review 09/04/2020 PLATELETS APPEAR DECREASED   Corrected   Comment: Normal  platelet morphology GIANT PLATELETS SEEN   . Immature Granulocytes 09/04/2020  1  % Final  . Abs Immature Granulocytes 09/04/2020 0.07  0.00 - 0.07 K/uL Final   Performed at [facility], [address] [place], [place] [address]    STUDIES: No results found.  ASSESSMENT:  Chronic refractory ITP  PLAN:   1. Chronic refractory ITP: Patient had a poor response to Prednisone, WinRho, and Rituxan. She is status post splenectomy in 2007.  She received IVIG and Nplate she received while at [facility] with significant improvement of her platelets.  If Nplate no longer was effective, could retry IVIG at a future date.  Patient is currently receiving the max dose of Nplate at 10 mcg/kg.  Can also consider Promacta 12.5mg  or Tavalisse 100 mg BID if needed.  Platelet count is 20 today, therefore proceed with treatment as planned.  Return to clinic in 4 weeks for repeat laboratory work, further evaluation, and continuation of Nplate.    2. Weakness and fatigue: Chronic and unchanged. 3.  Depression/grief: Improved.  Significantly improved. 4.  Poor appetite/weight loss: Resolved.  5.  Dementia: Chronic and unchanged.  Patient is now at [employer]. 6.  Leukocytosis: Mild. 7.  Anemia: Chronic and unchanged. 8.  Left shoulder pain: Resolved.  Patient expressed understanding and was in agreement with this plan. She also understands that She can call clinic at any time with any questions, concerns, or complaints.    Lloyd Huger, MD   09/04/2020 11:37 AM

## 2020-09-03 ENCOUNTER — Other Ambulatory Visit: Payer: Self-pay | Admitting: *Deleted

## 2020-09-03 DIAGNOSIS — D693 Immune thrombocytopenic purpura: Secondary | ICD-10-CM

## 2020-09-04 ENCOUNTER — Inpatient Hospital Stay: Payer: Medicare HMO | Admitting: Hospice and Palliative Medicine

## 2020-09-04 ENCOUNTER — Inpatient Hospital Stay: Payer: Medicare HMO

## 2020-09-04 ENCOUNTER — Other Ambulatory Visit: Payer: Self-pay

## 2020-09-04 ENCOUNTER — Inpatient Hospital Stay: Payer: Medicare HMO | Attending: Oncology

## 2020-09-04 ENCOUNTER — Encounter: Payer: Self-pay | Admitting: Oncology

## 2020-09-04 ENCOUNTER — Inpatient Hospital Stay (HOSPITAL_BASED_OUTPATIENT_CLINIC_OR_DEPARTMENT_OTHER): Payer: Medicare HMO | Admitting: Oncology

## 2020-09-04 VITALS — BP 132/56 | HR 54 | Temp 97.7°F | Resp 18 | Wt 130.1 lb

## 2020-09-04 DIAGNOSIS — D693 Immune thrombocytopenic purpura: Secondary | ICD-10-CM

## 2020-09-04 LAB — CBC WITH DIFFERENTIAL/PLATELET
Abs Immature Granulocytes: 0.07 10*3/uL (ref 0.00–0.07)
Basophils Absolute: 0.1 10*3/uL (ref 0.0–0.1)
Basophils Relative: 1 %
Eosinophils Absolute: 0.3 10*3/uL (ref 0.0–0.5)
Eosinophils Relative: 3 %
HCT: 34.7 % — ABNORMAL LOW (ref 36.0–46.0)
Hemoglobin: 10.9 g/dL — ABNORMAL LOW (ref 12.0–15.0)
Immature Granulocytes: 1 %
Lymphocytes Relative: 29 %
Lymphs Abs: 3.2 10*3/uL (ref 0.7–4.0)
MCH: 28.2 pg (ref 26.0–34.0)
MCHC: 31.4 g/dL (ref 30.0–36.0)
MCV: 89.9 fL (ref 80.0–100.0)
Monocytes Absolute: 1.1 10*3/uL — ABNORMAL HIGH (ref 0.1–1.0)
Monocytes Relative: 10 %
Neutro Abs: 6.1 10*3/uL (ref 1.7–7.7)
Neutrophils Relative %: 56 %
Platelets: 20 10*3/uL — CL (ref 150–400)
RBC: 3.86 MIL/uL — ABNORMAL LOW (ref 3.87–5.11)
RDW: 17 % — ABNORMAL HIGH (ref 11.5–15.5)
Smear Review: DECREASED
WBC: 10.8 10*3/uL — ABNORMAL HIGH (ref 4.0–10.5)
nRBC: 0 % (ref 0.0–0.2)

## 2020-09-04 MED ORDER — ROMIPLOSTIM INJECTION 500 MCG
10.0000 ug/kg | Freq: Once | SUBCUTANEOUS | Status: AC
  Administered 2020-09-04: 590 ug via SUBCUTANEOUS
  Filled 2020-09-04: qty 1.18

## 2020-09-18 DIAGNOSIS — R053 Chronic cough: Secondary | ICD-10-CM | POA: Diagnosis not present

## 2020-09-18 DIAGNOSIS — D473 Essential (hemorrhagic) thrombocythemia: Secondary | ICD-10-CM | POA: Diagnosis not present

## 2020-09-18 DIAGNOSIS — D62 Acute posthemorrhagic anemia: Secondary | ICD-10-CM | POA: Diagnosis not present

## 2020-10-02 ENCOUNTER — Inpatient Hospital Stay: Payer: Medicare HMO | Attending: Oncology

## 2020-10-02 ENCOUNTER — Other Ambulatory Visit: Payer: Self-pay

## 2020-10-02 ENCOUNTER — Encounter: Payer: Self-pay | Admitting: Oncology

## 2020-10-02 ENCOUNTER — Inpatient Hospital Stay: Payer: Medicare HMO

## 2020-10-02 ENCOUNTER — Inpatient Hospital Stay: Payer: Medicare HMO | Admitting: Oncology

## 2020-10-02 VITALS — BP 129/68 | HR 70 | Temp 97.5°F | Resp 20 | Wt 122.9 lb

## 2020-10-02 DIAGNOSIS — D693 Immune thrombocytopenic purpura: Secondary | ICD-10-CM | POA: Insufficient documentation

## 2020-10-02 DIAGNOSIS — E039 Hypothyroidism, unspecified: Secondary | ICD-10-CM | POA: Insufficient documentation

## 2020-10-02 DIAGNOSIS — Z79899 Other long term (current) drug therapy: Secondary | ICD-10-CM | POA: Diagnosis not present

## 2020-10-02 LAB — CBC WITH DIFFERENTIAL/PLATELET
Abs Immature Granulocytes: 0.05 10*3/uL (ref 0.00–0.07)
Basophils Absolute: 0.1 10*3/uL (ref 0.0–0.1)
Basophils Relative: 1 %
Eosinophils Absolute: 0.1 10*3/uL (ref 0.0–0.5)
Eosinophils Relative: 1 %
HCT: 41.6 % (ref 36.0–46.0)
Hemoglobin: 13.4 g/dL (ref 12.0–15.0)
Immature Granulocytes: 0 %
Lymphocytes Relative: 24 %
Lymphs Abs: 2.9 10*3/uL (ref 0.7–4.0)
MCH: 28.2 pg (ref 26.0–34.0)
MCHC: 32.2 g/dL (ref 30.0–36.0)
MCV: 87.6 fL (ref 80.0–100.0)
Monocytes Absolute: 0.9 10*3/uL (ref 0.1–1.0)
Monocytes Relative: 8 %
Neutro Abs: 7.8 10*3/uL — ABNORMAL HIGH (ref 1.7–7.7)
Neutrophils Relative %: 66 %
Platelets: 26 10*3/uL — CL (ref 150–400)
RBC: 4.75 MIL/uL (ref 3.87–5.11)
RDW: 17.4 % — ABNORMAL HIGH (ref 11.5–15.5)
Smear Review: DECREASED
WBC: 11.9 10*3/uL — ABNORMAL HIGH (ref 4.0–10.5)
nRBC: 0 % (ref 0.0–0.2)

## 2020-10-02 MED ORDER — ROMIPLOSTIM INJECTION 500 MCG
10.0000 ug/kg | Freq: Once | SUBCUTANEOUS | Status: AC
  Administered 2020-10-02: 12:00:00 555 ug via SUBCUTANEOUS
  Filled 2020-10-02: qty 1.11

## 2020-10-02 NOTE — Progress Notes (Signed)
Patient denies any concerns today.  

## 2020-10-02 NOTE — Progress Notes (Signed)
[place]  [phone]  [phone]) [phone]     Ariana [name] DOB: 05-24-1933  MR#: [id number]  JSE#:[id number]  Patient Care Team: Idelle Crouch, MD as PCP - General (Internal Medicine) Lloyd Huger, MD as Consulting Physician (Oncology)   CHIEF COMPLAINT: Chronic refractory ITP  INTERVAL HISTORY: Patient returns to clinic today for repeat laboratory work, further evaluation, and continuation of Nplate.  She was last evaluated in clinic by Dr. Grayland Ormond on 09/04/20.  She appeared to be doing well.  She had noticed increased bruising.  She received Nplate.  Today, she is doing well.  She denies any new complaints.  She has not had any further falls. She continues to have chronic weakness and fatigue.  She continues to have a poor memory. She has no neurologic complaints.  She denies any recent fevers or illnesses.  She denies any chest pain, shortness of breath, cough, or hemoptysis.  She denies any nausea, vomiting, constipation, or diarrhea.  She has no melena or hematochezia.  She has no urinary complaints.  Patient offers no further specific complaints today.  REVIEW OF SYSTEMS:   Review of Systems  Constitutional: Positive for malaise/fatigue. Negative for fever and weight loss.  HENT: Negative for congestion.   Respiratory: Negative.  Negative for cough, hemoptysis and shortness of breath.   Cardiovascular: Negative.  Negative for chest pain and leg swelling.  Gastrointestinal: Negative.  Negative for abdominal pain, blood in stool, diarrhea, melena and nausea.  Genitourinary: Negative.  Negative for dysuria and hematuria.  Musculoskeletal: Negative for back pain, falls and joint pain.  Skin: Negative.  Negative for rash.  Neurological: Positive for weakness. Negative for dizziness, sensory change, focal weakness and headaches.  Endo/Heme/Allergies: Bruises/bleeds easily.  Psychiatric/Behavioral: Positive for depression and memory  loss. The patient is not nervous/anxious.     As per HPI. Otherwise, a complete review of systems is negative.  PAST MEDICAL HISTORY: Past Medical History:  Diagnosis Date  . Alzheimer disease ([place])   . Anemia   . Anxiety   . Asthma    WELL CONTROLLED  . Chronic back pain   . Depression   . Depression   . GERD (gastroesophageal reflux disease)   . Hiatal hernia   . Hypercholesteremia   . Hypothyroidism   . ITP (idiopathic thrombocytopenic purpura)    FOLLOWED BR DR Grayland Ormond  . LBBB (left bundle branch block)   . Osteoarthritis   . Osteoporosis   . Restless legs     PAST SURGICAL HISTORY: Past Surgical History:  Procedure Laterality Date  . CARPAL TUNNEL RELEASE    . ESOPHAGOGASTRODUODENOSCOPY (EGD) WITH PROPOFOL N/A 09/10/2015   Procedure: ESOPHAGOGASTRODUODENOSCOPY (EGD) WITH PROPOFOL;  Surgeon: Lollie Sails, MD;  Location: [facility] ENDOSCOPY;  Service: Endoscopy;  Laterality: N/A;  . IRRIGATION AND DEBRIDEMENT HEMATOMA Right 07/13/2018   Procedure: IRRIGATION AND DEBRIDEMENT HEMATOMA-RIGHT SHIN;  Surgeon: Herbert Pun, MD;  Location: [facility] ORS;  Service: General;  Laterality: Right;  . KYPHOPLASTY N/A 05/02/2019   Procedure: L1 KYPHOPLASTY;  Surgeon: Hessie Knows, MD;  Location: [facility] ORS;  Service: Orthopedics;  Laterality: N/A;  . LUMBAR [place]    . REVERSE SHOULDER ARTHROPLASTY Left 06/25/2020   Procedure: REVERSE SHOULDER ARTHROPLASTY;  Surgeon: Corky Mull, MD;  Location: [facility] ORS;  Service: Orthopedics;  Laterality: Left;  . SPLENECTOMY, PARTIAL    . TOTAL ABDOMINAL HYSTERECTOMY      FAMILY HISTORY Family History  Problem Relation Age of Onset  .  Stroke Mother     GYNECOLOGIC HISTORY:  No LMP recorded. Patient has had a hysterectomy.     ADVANCED DIRECTIVES:    HEALTH MAINTENANCE: Social History   Tobacco Use  . Smoking status: Never Smoker  . Smokeless tobacco: Never Used  Vaping Use  . Vaping Use: Never used  Substance Use  Topics  . Alcohol use: No    Alcohol/week: 0.0 standard drinks  . Drug use: No     Allergies  Allergen Reactions  . Aspirin     Upset stomach  . Diazepam Itching    sneezing  . Other     seasonal allergies  . Tetanus Toxoids     Localized superficial swelling of skin  . Morphine Itching and Rash    Current Outpatient Medications  Medication Sig Dispense Refill  . acetaminophen (TYLENOL) 325 MG tablet Take 2 tablets (650 mg total) by mouth every 6 (six) hours as needed for mild pain (or Fever >/= 101). 100 tablet 0  . albuterol (PROVENTIL HFA;VENTOLIN HFA) 108 (90 BASE) MCG/ACT inhaler Inhale 2 puffs into the lungs every 6 (six) hours as needed for wheezing or shortness of breath.    . ALPRAZolam (XANAX) 0.5 MG tablet Take 1 tablet (0.5 mg total) by mouth 3 (three) times daily as needed for anxiety. 20 tablet 0  . buPROPion (WELLBUTRIN XL) 150 MG 24 hr tablet Take 150 mg by mouth every morning.     . busPIRone (BUSPAR) 15 MG tablet Take 15 mg by mouth 2 (two) times daily.     . Calcium Carbonate-Vit D-Min (CALCIUM 600+D PLUS MINERALS) 600-400 MG-UNIT TABS Take 1 tablet by mouth daily.     . Carboxymethylcellul-Glycerin (LUBRICATING EYE DROPS OP) Place 1 drop into both eyes daily as needed (dry eyes).    . Cholecalciferol (VITAMIN D) 50 MCG (2000 UT) tablet Take 2,000 Units by mouth daily.     . citalopram (CELEXA) 40 MG tablet Take 40 mg by mouth every morning.     . Cyanocobalamin 1000 MCG TBCR Take 1,000 mcg by mouth daily.     Marland Kitchen donepezil (ARICEPT) 10 MG tablet Take 10 mg by mouth at bedtime.     . feeding supplement, ENSURE ENLIVE, (ENSURE ENLIVE) LIQD Take 237 mLs by mouth 2 (two) times daily between meals. (Patient taking differently: Take 237 mLs by mouth daily with breakfast.) 237 mL 12  . fluticasone (FLONASE) 50 MCG/ACT nasal spray Place 2 sprays into both nostrils daily as needed for allergies.     . Fluticasone-Salmeterol (ADVAIR) 250-50 MCG/DOSE AEPB Inhale 1 puff into  the lungs 2 (two) times daily as needed (shortness of breath).     . gabapentin (NEURONTIN) 300 MG capsule Take 300 mg by mouth 4 (four) times daily.    Marland Kitchen lamoTRIgine (LAMICTAL) 25 MG tablet Take 50 mg by mouth at bedtime.    Marland Kitchen levothyroxine (SYNTHROID, LEVOTHROID) 50 MCG tablet Take 50 mcg by mouth daily before breakfast. Take 30 to 60 minutes before breakfast.    . memantine (NAMENDA) 10 MG tablet Take 10 mg by mouth 2 (two) times daily.     . montelukast (SINGULAIR) 10 MG tablet Take 10 mg by mouth at bedtime.     . Multiple Vitamin (MULTI-VITAMIN) tablet Take 1 tablet by mouth daily.     . Multiple Vitamins-Minerals (PRESERVISION AREDS 2) CAPS Take 1 capsule by mouth 2 (two) times a day.    Marland Kitchen NARCAN 4 MG/0.1ML LIQD nasal spray kit Place 1  spray into the nose once.     Marland Kitchen oxybutynin (DITROPAN) 5 MG tablet Take 5 mg by mouth 3 (three) times daily.    Marland Kitchen oxyCODONE (ROXICODONE) 5 MG immediate release tablet Take 1 tablet (5 mg total) by mouth every 4 (four) hours as needed for breakthrough pain. 40 tablet 0  . oxyCODONE-acetaminophen (PERCOCET) 10-325 MG tablet Take 1 tablet by mouth every 6 (six) hours as needed for moderate pain or severe pain.    . pantoprazole (PROTONIX) 20 MG tablet Take 20 mg by mouth 2 (two) times daily.     . pravastatin (PRAVACHOL) 20 MG tablet Take 20 mg by mouth at bedtime.     . vitamin C (ASCORBIC ACID) 500 MG tablet Take 500 mg by mouth daily.    . vitamin E 180 MG (400 UNITS) capsule Take 400 Units by mouth daily.     No current facility-administered medications for this visit.    OBJECTIVE: BP 129/68   Pulse 70   Temp (!) 97.5 F (36.4 C) (Tympanic)   Resp 20   Wt 122 lb 14.4 oz (55.7 kg)   BMI 20.45 kg/m    Body mass index is 20.45 kg/m.    ECOG FS:1 - Symptomatic but completely ambulatory  Physical Exam Constitutional:      General: Vital signs are normal.     Appearance: Normal appearance.  HENT:     Head: Normocephalic and atraumatic.  Eyes:      Pupils: Pupils are equal, round, and reactive to light.  Cardiovascular:     Rate and Rhythm: Normal rate and regular rhythm.     Heart sounds: Normal heart sounds. No murmur heard.   Pulmonary:     Effort: Pulmonary effort is normal.     Breath sounds: Normal breath sounds. No wheezing.  Abdominal:     General: Bowel sounds are normal. There is no distension.     Palpations: Abdomen is soft.     Tenderness: There is no abdominal tenderness.  Musculoskeletal:        General: No edema. Normal range of motion.     Cervical back: Normal range of motion.  Skin:    General: Skin is warm and dry.     Findings: No rash.  Neurological:     Mental Status: She is alert and oriented to person, place, and time.     Gait: Gait is intact.  Psychiatric:        Mood and Affect: Mood and affect normal.        Cognition and Memory: Memory normal.        Judgment: Judgment normal.      LAB RESULTS:  No visits with results within 3 Day(s) from this visit.  Latest known visit with results is:  Appointment on 09/04/2020  Component Date Value Ref Range Status  . WBC 09/04/2020 10.8* 4.0 - 10.5 K/uL Final  . RBC 09/04/2020 3.86* 3.87 - 5.11 MIL/uL Final  . Hemoglobin 09/04/2020 10.9* 12.0 - 15.0 g/dL Final  . HCT 09/04/2020 34.7* 36.0 - 46.0 % Final  . MCV 09/04/2020 89.9  80.0 - 100.0 fL Final  . MCH 09/04/2020 28.2  26.0 - 34.0 pg Final  . MCHC 09/04/2020 31.4  30.0 - 36.0 g/dL Final  . RDW 09/04/2020 17.0* 11.5 - 15.5 % Final  . Platelets 09/04/2020 20* 150 - 400 K/uL Final   Comment: Immature Platelet Fraction may be clinically indicated, consider ordering this additional test [id number] THIS CRITICAL  RESULT HAS VERIFIED AND BEEN CALLED TO DR.FINNEGAN BY CHASE ISLEY ON 11 24 2021 AT 1115, AND HAS BEEN READ BACK.    Marland Kitchen nRBC 09/04/2020 0.0  0.0 - 0.2 % Final  . Neutrophils Relative % 09/04/2020 56  % Final  . Neutro Abs 09/04/2020 6.1  1.7 - 7.7 K/uL Final  . Lymphocytes Relative  09/04/2020 29  % Final  . Lymphs Abs 09/04/2020 3.2  0.7 - 4.0 K/uL Final  . Monocytes Relative 09/04/2020 10  % Final  . Monocytes Absolute 09/04/2020 1.1* 0.1 - 1.0 K/uL Final  . Eosinophils Relative 09/04/2020 3  % Final  . Eosinophils Absolute 09/04/2020 0.3  0.0 - 0.5 K/uL Final  . Basophils Relative 09/04/2020 1  % Final  . Basophils Absolute 09/04/2020 0.1  0.0 - 0.1 K/uL Final  . RBC Morphology 09/04/2020 MORPHOLOGY UNREMARKABLE   Corrected  . Smear Review 09/04/2020 PLATELETS APPEAR DECREASED   Corrected   Comment: Normal platelet morphology GIANT PLATELETS SEEN   . Immature Granulocytes 09/04/2020 1  % Final  . Abs Immature Granulocytes 09/04/2020 0.07  0.00 - 0.07 K/uL Final   Performed at [facility], [address] [place], [place] [address]    STUDIES: No results found.  ASSESSMENT:  Chronic refractory ITP  PLAN:   1. Chronic refractory ITP:  -Had poor response to Prednisone, WinRho, and Rituxan.  -She is status post splenectomy in 2007.   -Received IVIG and Nplate she received while at [facility] with significant improvement of her platelets.   -If Nplate no longer was effective, could retry IVIG at a future date.   -Patient is currently receiving the max dose of Nplate at 10 mcg/kg.  -Can also consider Promacta 12.69m or Tavalisse 100 mg BID if needed.   -Platelet count is 26 today.  -Proceed with treatment as planned.  Disposition: -RTC in 4 weeks for lab work, further evaluation and continuation of Nplate.  Greater than 50% was spent in counseling and coordination of care with this patient including but not limited to discussion of the relevant topics above (See A&P) including, but not limited to diagnosis and management of acute and chronic medical conditions.   Patient expressed understanding and was in agreement with this plan. She also understands that She can call clinic at any time with any questions, concerns, or complaints.    JJacquelin Hawking NP    10/02/2020 11:12 AM

## 2020-10-09 DIAGNOSIS — Z96612 Presence of left artificial shoulder joint: Secondary | ICD-10-CM | POA: Diagnosis not present

## 2020-10-09 DIAGNOSIS — R29898 Other symptoms and signs involving the musculoskeletal system: Secondary | ICD-10-CM | POA: Diagnosis not present

## 2020-10-09 DIAGNOSIS — M25612 Stiffness of left shoulder, not elsewhere classified: Secondary | ICD-10-CM | POA: Diagnosis not present

## 2020-10-09 DIAGNOSIS — M25512 Pain in left shoulder: Secondary | ICD-10-CM | POA: Diagnosis not present

## 2020-10-24 DIAGNOSIS — M12812 Other specific arthropathies, not elsewhere classified, left shoulder: Secondary | ICD-10-CM | POA: Diagnosis not present

## 2020-10-24 DIAGNOSIS — Z96612 Presence of left artificial shoulder joint: Secondary | ICD-10-CM | POA: Diagnosis not present

## 2020-10-26 NOTE — Progress Notes (Deleted)
[place]  [phone]  [phone]     Ariana [name] DOB: [birth year]  MR#: [id number]  JSE#:[id number]  Patient Care Team: Idelle Crouch, MD as PCP - General (Internal Medicine) Lloyd Huger, MD as Consulting Physician (Oncology)   CHIEF COMPLAINT: Chronic refractory ITP  INTERVAL HISTORY: Patient returns to clinic today for repeat laboratory work, further evaluation, and continuation of Nplate.  She has noticed increased bruising recently, but otherwise has felt well.  She has no further falls. She continues to have chronic weakness and fatigue.  She continues to have a poor memory. She has no neurologic complaints.  She denies any recent fevers or illnesses.  She denies any chest pain, shortness of breath, cough, or hemoptysis.  She denies any nausea, vomiting, constipation, or diarrhea.  She has no melena or hematochezia.  She has no urinary complaints.  Patient offers no further specific complaints today.  REVIEW OF SYSTEMS:   Review of Systems  Constitutional: Positive for malaise/fatigue. Negative for fever and weight loss.  HENT: Negative for congestion.   Respiratory: Negative.  Negative for cough, hemoptysis and shortness of breath.   Cardiovascular: Negative.  Negative for chest pain and leg swelling.  Gastrointestinal: Negative.  Negative for abdominal pain, blood in stool, diarrhea, melena and nausea.  Genitourinary: Negative.  Negative for dysuria and hematuria.  Musculoskeletal: Negative for back pain, falls and joint pain.  Skin: Negative.  Negative for rash.  Neurological: Positive for weakness. Negative for dizziness, sensory change, focal weakness and headaches.  Endo/Heme/Allergies: Bruises/bleeds easily.  Psychiatric/Behavioral: Positive for memory loss. Negative for depression. The patient is not nervous/anxious.     As per HPI. Otherwise, a complete review of systems is negative.  PAST MEDICAL  HISTORY: Past Medical History:  Diagnosis Date  . Alzheimer disease ([place])   . Anemia   . Anxiety   . Asthma    WELL CONTROLLED  . Chronic back pain   . Depression   . Depression   . GERD (gastroesophageal reflux disease)   . Hiatal hernia   . Hypercholesteremia   . Hypothyroidism   . ITP (idiopathic thrombocytopenic purpura)    FOLLOWED BR DR Grayland Ormond  . LBBB (left bundle branch block)   . Osteoarthritis   . Osteoporosis   . Restless legs     PAST SURGICAL HISTORY: Past Surgical History:  Procedure Laterality Date  . CARPAL TUNNEL RELEASE    . ESOPHAGOGASTRODUODENOSCOPY (EGD) WITH PROPOFOL N/A 09/10/2015   Procedure: ESOPHAGOGASTRODUODENOSCOPY (EGD) WITH PROPOFOL;  Surgeon: Lollie Sails, MD;  Location[facility] ENDOSCOPY;  Service: Endoscopy;  Laterality: N/A;  . IRRIGATION AND DEBRIDEMENT HEMATOMA Right 07/13/2018   Procedure: IRRIGATION AND DEBRIDEMENT HEMATOMA-RIGHT SHIN;  Surgeon: Herbert Pun, MD;  Location: [facility] ORS;  Service: General;  Laterality: Right;  . KYPHOPLASTY N/A 05/02/2019   Procedure: L1 KYPHOPLASTY;  Surgeon: Hessie Knows, MD;  Location: [facility] ORS;  Service: Orthopedics;  Laterality: N/A;  . LUMBAR [place]    . REVERSE SHOULDER ARTHROPLASTY Left 06/25/2020   Procedure: REVERSE SHOULDER ARTHROPLASTY;  Surgeon: Corky Mull, MD;  Location: [facility] ORS;  Service: Orthopedics;  Laterality: Left;  . SPLENECTOMY, PARTIAL    . TOTAL ABDOMINAL HYSTERECTOMY      FAMILY HISTORY Family History  Problem Relation Age of Onset  . Stroke Mother     GYNECOLOGIC HISTORY:  No LMP recorded. Patient has had a hysterectomy.     ADVANCED DIRECTIVES:    HEALTH MAINTENANCE: Social History  Tobacco Use  . Smoking status: Never Smoker  . Smokeless tobacco: Never Used  Vaping Use  . Vaping Use: Never used  Substance Use Topics  . Alcohol use: No    Alcohol/week: 0.0 standard drinks  . Drug use: No     Allergies  Allergen Reactions  . Aspirin      Upset stomach  . Diazepam Itching    sneezing  . Other     seasonal allergies  . Tetanus Toxoids     Localized superficial swelling of skin  . Morphine Itching and Rash    Current Outpatient Medications  Medication Sig Dispense Refill  . acetaminophen (TYLENOL) 325 MG tablet Take 2 tablets (650 mg total) by mouth every 6 (six) hours as needed for mild pain (or Fever >/= 101). 100 tablet 0  . albuterol (PROVENTIL HFA;VENTOLIN HFA) 108 (90 BASE) MCG/ACT inhaler Inhale 2 puffs into the lungs every 6 (six) hours as needed for wheezing or shortness of breath.    . ALPRAZolam (XANAX) 0.5 MG tablet Take 1 tablet (0.5 mg total) by mouth 3 (three) times daily as needed for anxiety. 20 tablet 0  . buPROPion (WELLBUTRIN XL) 150 MG 24 hr tablet Take 150 mg by mouth every morning.     . busPIRone (BUSPAR) 15 MG tablet Take 15 mg by mouth 2 (two) times daily.     . Calcium Carbonate-Vit D-Min (CALCIUM 600+D PLUS MINERALS) 600-400 MG-UNIT TABS Take 1 tablet by mouth daily.     . Carboxymethylcellul-Glycerin (LUBRICATING EYE DROPS OP) Place 1 drop into both eyes daily as needed (dry eyes).    . Cholecalciferol (VITAMIN D) 50 MCG (2000 UT) tablet Take 2,000 Units by mouth daily.     . citalopram (CELEXA) 40 MG tablet Take 40 mg by mouth every morning.     . Cyanocobalamin 1000 MCG TBCR Take 1,000 mcg by mouth daily.     Marland Kitchen donepezil (ARICEPT) 10 MG tablet Take 10 mg by mouth at bedtime.     . feeding supplement, ENSURE ENLIVE, (ENSURE ENLIVE) LIQD Take 237 mLs by mouth 2 (two) times daily between meals. (Patient taking differently: Take 237 mLs by mouth daily with breakfast.) 237 mL 12  . fluticasone (FLONASE) 50 MCG/ACT nasal spray Place 2 sprays into both nostrils daily as needed for allergies.     . Fluticasone-Salmeterol (ADVAIR) 250-50 MCG/DOSE AEPB Inhale 1 puff into the lungs 2 (two) times daily as needed (shortness of breath).     . gabapentin (NEURONTIN) 300 MG capsule Take 300 mg by mouth 4  (four) times daily.    Marland Kitchen lamoTRIgine (LAMICTAL) 25 MG tablet Take 50 mg by mouth at bedtime.    Marland Kitchen levothyroxine (SYNTHROID, LEVOTHROID) 50 MCG tablet Take 50 mcg by mouth daily before breakfast. Take 30 to 60 minutes before breakfast.    . memantine (NAMENDA) 10 MG tablet Take 10 mg by mouth 2 (two) times daily.     . montelukast (SINGULAIR) 10 MG tablet Take 10 mg by mouth at bedtime.     . Multiple Vitamin (MULTI-VITAMIN) tablet Take 1 tablet by mouth daily.     . Multiple Vitamins-Minerals (PRESERVISION AREDS 2) CAPS Take 1 capsule by mouth 2 (two) times a day.    Marland Kitchen NARCAN 4 MG/0.1ML LIQD nasal spray kit Place 1 spray into the nose once.     Marland Kitchen oxybutynin (DITROPAN) 5 MG tablet Take 5 mg by mouth 3 (three) times daily.    Marland Kitchen oxyCODONE (ROXICODONE) 5 MG  immediate release tablet Take 1 tablet (5 mg total) by mouth every 4 (four) hours as needed for breakthrough pain. 40 tablet 0  . oxyCODONE-acetaminophen (PERCOCET) 10-325 MG tablet Take 1 tablet by mouth every 6 (six) hours as needed for moderate pain or severe pain.    . pantoprazole (PROTONIX) 20 MG tablet Take 20 mg by mouth 2 (two) times daily.     . pravastatin (PRAVACHOL) 20 MG tablet Take 20 mg by mouth at bedtime.     . vitamin C (ASCORBIC ACID) 500 MG tablet Take 500 mg by mouth daily.    . vitamin E 180 MG (400 UNITS) capsule Take 400 Units by mouth daily.     No current facility-administered medications for this visit.    OBJECTIVE: There were no vitals taken for this visit.   There is no height or weight on file to calculate BMI.    ECOG FS:1 - Symptomatic but completely ambulatory  General: Thin, no acute distress. Eyes: Pink conjunctiva, anicteric sclera. HEENT: Normocephalic, moist mucous membranes. Lungs: No audible wheezing or coughing. Heart: Regular rate and rhythm. Abdomen: Soft, nontender, no obvious distention. Musculoskeletal: No edema, cyanosis, or clubbing. Neuro: Alert, answering all questions appropriately.  Cranial nerves grossly intact. Skin: No rashes or petechiae noted. Psych: Normal affect.   LAB RESULTS:  No visits with results within 3 Day(s) from this visit.  Latest known visit with results is:  Appointment on 10/02/2020  Component Date Value Ref Range Status  . WBC 10/02/2020 11.9* 4.0 - 10.5 K/uL Final  . RBC 10/02/2020 4.75  3.87 - 5.11 MIL/uL Final  . Hemoglobin 10/02/2020 13.4  12.0 - 15.0 g/dL Final  . HCT 10/02/2020 41.6  36.0 - 46.0 % Final  . MCV 10/02/2020 87.6  80.0 - 100.0 fL Final  . MCH 10/02/2020 28.2  26.0 - 34.0 pg Final  . MCHC 10/02/2020 32.2  30.0 - 36.0 g/dL Final  . RDW 10/02/2020 17.4* 11.5 - 15.5 % Final  . Platelets 10/02/2020 26* 150 - 400 K/uL Final   Comment: Immature Platelet Fraction may be clinically indicated, consider ordering this additional test [id number] THIS CRITICAL RESULT HAS VERIFIED AND BEEN CALLED TO BURNS,J BY CHASE ISLEY ON 12 22 2021 AT 1115, AND HAS BEEN READ BACK.    Marland Kitchen nRBC 10/02/2020 0.0  0.0 - 0.2 % Final  . Neutrophils Relative % 10/02/2020 66  % Final  . Neutro Abs 10/02/2020 7.8* 1.7 - 7.7 K/uL Final  . Lymphocytes Relative 10/02/2020 24  % Final  . Lymphs Abs 10/02/2020 2.9  0.7 - 4.0 K/uL Final  . Monocytes Relative 10/02/2020 8  % Final  . Monocytes Absolute 10/02/2020 0.9  0.1 - 1.0 K/uL Final  . Eosinophils Relative 10/02/2020 1  % Final  . Eosinophils Absolute 10/02/2020 0.1  0.0 - 0.5 K/uL Final  . Basophils Relative 10/02/2020 1  % Final  . Basophils Absolute 10/02/2020 0.1  0.0 - 0.1 K/uL Final  . Smear Review 10/02/2020 PLATELETS APPEAR DECREASED   Final   Comment: PLATELET COUNT CONFIRMED BY SMEAR GIANT PLATELETS PRESENT   . Immature Granulocytes 10/02/2020 0  % Final  . Abs Immature Granulocytes 10/02/2020 0.05  0.00 - 0.07 K/uL Final   Performed at [facility], [address] [place], [place] [address]    STUDIES: No results found.  ASSESSMENT:  Chronic refractory ITP  PLAN:   1. Chronic  refractory ITP: Patient had a poor response to Prednisone, WinRho, and Rituxan.  She is status post splenectomy in 2007.  She received IVIG and Nplate she received while at [facility] with significant improvement of her platelets.  If Nplate no longer was effective, could retry IVIG at a future date.  Patient is currently receiving the max dose of Nplate at 10 mcg/kg.  Can also consider Promacta 12.5mg  or Tavalisse 100 mg BID if needed.  Platelet count is 20 today, therefore proceed with treatment as planned.  Return to clinic in 4 weeks for repeat laboratory work, further evaluation, and continuation of Nplate.    2. Weakness and fatigue: Chronic and unchanged. 3.  Depression/grief: Improved.  Significantly improved. 4.  Poor appetite/weight loss: Resolved.  5.  Dementia: Chronic and unchanged.  Patient is now at [employer]. 6.  Leukocytosis: Mild. 7.  Anemia: Chronic and unchanged. 8.  Left shoulder pain: Resolved.  Patient expressed understanding and was in agreement with this plan. She also understands that She can call clinic at any time with any questions, concerns, or complaints.    Lloyd Huger, MD   10/26/2020 8:15 AM

## 2020-10-30 ENCOUNTER — Inpatient Hospital Stay
Admission: EM | Admit: 2020-10-30 | Discharge: 2020-11-12 | DRG: 481 | Disposition: A | Discharge status: Home Health | Payer: Medicare HMO | Attending: Internal Medicine | Admitting: Internal Medicine

## 2020-10-30 ENCOUNTER — Emergency Department: Payer: Medicare HMO

## 2020-10-30 ENCOUNTER — Inpatient Hospital Stay: Payer: Medicare HMO

## 2020-10-30 ENCOUNTER — Inpatient Hospital Stay: Payer: Medicare HMO | Admitting: Oncology

## 2020-10-30 ENCOUNTER — Other Ambulatory Visit: Payer: Self-pay

## 2020-10-30 ENCOUNTER — Telehealth: Payer: Self-pay | Admitting: Oncology

## 2020-10-30 DIAGNOSIS — F0280 Dementia in other diseases classified elsewhere without behavioral disturbance: Secondary | ICD-10-CM | POA: Diagnosis present

## 2020-10-30 DIAGNOSIS — I10 Essential (primary) hypertension: Secondary | ICD-10-CM | POA: Diagnosis not present

## 2020-10-30 DIAGNOSIS — Z79899 Other long term (current) drug therapy: Secondary | ICD-10-CM | POA: Diagnosis not present

## 2020-10-30 DIAGNOSIS — Z9081 Acquired absence of spleen: Secondary | ICD-10-CM | POA: Diagnosis not present

## 2020-10-30 DIAGNOSIS — D62 Acute posthemorrhagic anemia: Secondary | ICD-10-CM | POA: Diagnosis not present

## 2020-10-30 DIAGNOSIS — S7002XA Contusion of left hip, initial encounter: Secondary | ICD-10-CM | POA: Diagnosis not present

## 2020-10-30 DIAGNOSIS — G2581 Restless legs syndrome: Secondary | ICD-10-CM | POA: Diagnosis present

## 2020-10-30 DIAGNOSIS — K219 Gastro-esophageal reflux disease without esophagitis: Secondary | ICD-10-CM | POA: Diagnosis present

## 2020-10-30 DIAGNOSIS — M810 Age-related osteoporosis without current pathological fracture: Secondary | ICD-10-CM | POA: Diagnosis not present

## 2020-10-30 DIAGNOSIS — W000XXA Fall on same level due to ice and snow, initial encounter: Secondary | ICD-10-CM | POA: Diagnosis present

## 2020-10-30 DIAGNOSIS — Z90710 Acquired absence of both cervix and uterus: Secondary | ICD-10-CM

## 2020-10-30 DIAGNOSIS — F32A Depression, unspecified: Secondary | ICD-10-CM | POA: Diagnosis present

## 2020-10-30 DIAGNOSIS — Y92007 Garden or yard of unspecified non-institutional (private) residence as the place of occurrence of the external cause: Secondary | ICD-10-CM | POA: Diagnosis not present

## 2020-10-30 DIAGNOSIS — M25552 Pain in left hip: Secondary | ICD-10-CM | POA: Diagnosis not present

## 2020-10-30 DIAGNOSIS — I447 Left bundle-branch block, unspecified: Secondary | ICD-10-CM | POA: Diagnosis present

## 2020-10-30 DIAGNOSIS — S0083XA Contusion of other part of head, initial encounter: Secondary | ICD-10-CM | POA: Diagnosis present

## 2020-10-30 DIAGNOSIS — Z66 Do not resuscitate: Secondary | ICD-10-CM | POA: Diagnosis present

## 2020-10-30 DIAGNOSIS — Z79890 Hormone replacement therapy (postmenopausal): Secondary | ICD-10-CM | POA: Diagnosis not present

## 2020-10-30 DIAGNOSIS — F0390 Unspecified dementia without behavioral disturbance: Secondary | ICD-10-CM | POA: Diagnosis not present

## 2020-10-30 DIAGNOSIS — M818 Other osteoporosis without current pathological fracture: Secondary | ICD-10-CM | POA: Diagnosis not present

## 2020-10-30 DIAGNOSIS — R9431 Abnormal electrocardiogram [ECG] [EKG]: Secondary | ICD-10-CM | POA: Diagnosis not present

## 2020-10-30 DIAGNOSIS — S72012A Unspecified intracapsular fracture of left femur, initial encounter for closed fracture: Principal | ICD-10-CM | POA: Diagnosis present

## 2020-10-30 DIAGNOSIS — D473 Essential (hemorrhagic) thrombocythemia: Secondary | ICD-10-CM

## 2020-10-30 DIAGNOSIS — D693 Immune thrombocytopenic purpura: Secondary | ICD-10-CM

## 2020-10-30 DIAGNOSIS — G4733 Obstructive sleep apnea (adult) (pediatric): Secondary | ICD-10-CM | POA: Diagnosis present

## 2020-10-30 DIAGNOSIS — S72002A Fracture of unspecified part of neck of left femur, initial encounter for closed fracture: Secondary | ICD-10-CM | POA: Diagnosis present

## 2020-10-30 DIAGNOSIS — E039 Hypothyroidism, unspecified: Secondary | ICD-10-CM | POA: Diagnosis present

## 2020-10-30 DIAGNOSIS — J45909 Unspecified asthma, uncomplicated: Secondary | ICD-10-CM | POA: Diagnosis present

## 2020-10-30 DIAGNOSIS — E7800 Pure hypercholesterolemia, unspecified: Secondary | ICD-10-CM | POA: Diagnosis present

## 2020-10-30 DIAGNOSIS — Z981 Arthrodesis status: Secondary | ICD-10-CM | POA: Diagnosis not present

## 2020-10-30 DIAGNOSIS — K625 Hemorrhage of anus and rectum: Secondary | ICD-10-CM | POA: Diagnosis not present

## 2020-10-30 DIAGNOSIS — Z96612 Presence of left artificial shoulder joint: Secondary | ICD-10-CM | POA: Diagnosis present

## 2020-10-30 DIAGNOSIS — Z20822 Contact with and (suspected) exposure to covid-19: Secondary | ICD-10-CM | POA: Diagnosis present

## 2020-10-30 DIAGNOSIS — G301 Alzheimer's disease with late onset: Secondary | ICD-10-CM | POA: Diagnosis not present

## 2020-10-30 DIAGNOSIS — K449 Diaphragmatic hernia without obstruction or gangrene: Secondary | ICD-10-CM | POA: Diagnosis present

## 2020-10-30 DIAGNOSIS — F419 Anxiety disorder, unspecified: Secondary | ICD-10-CM | POA: Diagnosis not present

## 2020-10-30 DIAGNOSIS — F329 Major depressive disorder, single episode, unspecified: Secondary | ICD-10-CM | POA: Diagnosis present

## 2020-10-30 DIAGNOSIS — I959 Hypotension, unspecified: Secondary | ICD-10-CM | POA: Diagnosis not present

## 2020-10-30 DIAGNOSIS — J189 Pneumonia, unspecified organism: Secondary | ICD-10-CM | POA: Diagnosis not present

## 2020-10-30 DIAGNOSIS — Y93K1 Activity, walking an animal: Secondary | ICD-10-CM

## 2020-10-30 DIAGNOSIS — G309 Alzheimer's disease, unspecified: Secondary | ICD-10-CM | POA: Diagnosis not present

## 2020-10-30 DIAGNOSIS — R52 Pain, unspecified: Secondary | ICD-10-CM | POA: Diagnosis not present

## 2020-10-30 DIAGNOSIS — W19XXXA Unspecified fall, initial encounter: Secondary | ICD-10-CM

## 2020-10-30 DIAGNOSIS — E785 Hyperlipidemia, unspecified: Secondary | ICD-10-CM | POA: Diagnosis not present

## 2020-10-30 DIAGNOSIS — R0902 Hypoxemia: Secondary | ICD-10-CM | POA: Diagnosis not present

## 2020-10-30 DIAGNOSIS — I517 Cardiomegaly: Secondary | ICD-10-CM | POA: Diagnosis not present

## 2020-10-30 DIAGNOSIS — R519 Headache, unspecified: Secondary | ICD-10-CM | POA: Diagnosis not present

## 2020-10-30 DIAGNOSIS — Z419 Encounter for procedure for purposes other than remedying health state, unspecified: Secondary | ICD-10-CM

## 2020-10-30 DIAGNOSIS — S72042A Displaced fracture of base of neck of left femur, initial encounter for closed fracture: Secondary | ICD-10-CM | POA: Diagnosis not present

## 2020-10-30 DIAGNOSIS — Z515 Encounter for palliative care: Secondary | ICD-10-CM

## 2020-10-30 DIAGNOSIS — D72828 Other elevated white blood cell count: Secondary | ICD-10-CM | POA: Diagnosis present

## 2020-10-30 LAB — COMPREHENSIVE METABOLIC PANEL
ALT: 14 U/L (ref 0–44)
AST: 30 U/L (ref 15–41)
Albumin: 3.7 g/dL (ref 3.5–5.0)
Alkaline Phosphatase: 90 U/L (ref 38–126)
Anion gap: 10 (ref 5–15)
BUN: 20 mg/dL (ref 8–23)
CO2: 25 mmol/L (ref 22–32)
Calcium: 9.3 mg/dL (ref 8.9–10.3)
Chloride: 105 mmol/L (ref 98–111)
Creatinine, Ser: 0.92 mg/dL (ref 0.44–1.00)
GFR, Estimated: >60 mL/min (ref >60)
Glucose, Bld: 109 mg/dL — ABNORMAL HIGH (ref 70–99)
Potassium: 3.7 mmol/L (ref 3.5–5.1)
Sodium: 140 mmol/L (ref 135–145)
Total Bilirubin: 0.5 mg/dL (ref 0.3–1.2)
Total Protein: 6.1 g/dL — ABNORMAL LOW (ref 6.5–8.1)

## 2020-10-30 LAB — RESP PANEL BY RT-PCR (FLU A&B, COVID) ARPGX2
Influenza A by PCR: NEGATIVE
Influenza B by PCR: NEGATIVE
SARS Coronavirus 2 by RT PCR: NEGATIVE

## 2020-10-30 LAB — CBC
HCT: 34.7 % — ABNORMAL LOW (ref 36.0–46.0)
Hemoglobin: 10.7 g/dL — ABNORMAL LOW (ref 12.0–15.0)
MCH: 27.4 pg (ref 26.0–34.0)
MCHC: 30.8 g/dL (ref 30.0–36.0)
MCV: 89 fL (ref 80.0–100.0)
Platelets: 32 10*3/uL — ABNORMAL LOW (ref 150–400)
RBC: 3.9 MIL/uL (ref 3.87–5.11)
RDW: 17.9 % — ABNORMAL HIGH (ref 11.5–15.5)
WBC: 13.5 10*3/uL — ABNORMAL HIGH (ref 4.0–10.5)
nRBC: 0 % (ref 0.0–0.2)

## 2020-10-30 LAB — TROPONIN I (HIGH SENSITIVITY): Troponin I (High Sensitivity): 10 ng/L (ref <18)

## 2020-10-30 LAB — PROTIME-INR
INR: 1.1 (ref 0.8–1.2)
Prothrombin Time: 13.3 seconds (ref 11.4–15.2)

## 2020-10-30 MED ORDER — MORPHINE SULFATE (PF) 2 MG/ML IV SOLN
0.5000 mg | INTRAVENOUS | Status: DC | PRN
Start: 2020-10-30 — End: 2020-10-31

## 2020-10-30 MED ORDER — MOMETASONE FURO-FORMOTEROL FUM 200-5 MCG/ACT IN AERO
2.0000 | INHALATION_SPRAY | Freq: Two times a day (BID) | RESPIRATORY_TRACT | Status: DC
  Administered 2020-10-30 – 2020-10-31 (×2): 2 via RESPIRATORY_TRACT
  Filled 2020-10-30: qty 8.8

## 2020-10-30 MED ORDER — PRAVASTATIN SODIUM 20 MG PO TABS
20.0000 mg | ORAL_TABLET | Freq: Every day | ORAL | Status: DC
  Administered 2020-10-30 – 2020-11-11 (×12): 20 mg via ORAL
  Filled 2020-10-30 (×13): qty 1

## 2020-10-30 MED ORDER — VITAMIN D 25 MCG (1000 UNIT) PO TABS
2000.0000 [IU] | ORAL_TABLET | Freq: Every day | ORAL | Status: DC
  Administered 2020-11-01 – 2020-11-12 (×12): 2000 [IU] via ORAL
  Filled 2020-10-30 (×12): qty 2

## 2020-10-30 MED ORDER — MONTELUKAST SODIUM 10 MG PO TABS
10.0000 mg | ORAL_TABLET | Freq: Every day | ORAL | Status: DC
Start: 2020-10-30 — End: 2020-11-12
  Administered 2020-10-30 – 2020-11-11 (×11): 10 mg via ORAL
  Filled 2020-10-30 (×12): qty 1

## 2020-10-30 MED ORDER — HYDROCODONE-ACETAMINOPHEN 5-325 MG PO TABS
1.0000 | ORAL_TABLET | Freq: Four times a day (QID) | ORAL | Status: DC | PRN
Start: 2020-10-30 — End: 2020-10-31
  Administered 2020-10-30: 1 via ORAL
  Administered 2020-10-30: 2 via ORAL
  Filled 2020-10-30: qty 2
  Filled 2020-10-30: qty 1

## 2020-10-30 MED ORDER — ALPRAZOLAM 0.5 MG PO TABS
0.5000 mg | ORAL_TABLET | Freq: Three times a day (TID) | ORAL | Status: DC | PRN
  Administered 2020-10-30 – 2020-11-11 (×19): 0.5 mg via ORAL
  Filled 2020-10-30 (×19): qty 1

## 2020-10-30 MED ORDER — VITAMIN E 45 MG (100 UNIT) PO CAPS
400.0000 [IU] | ORAL_CAPSULE | Freq: Every day | ORAL | Status: DC
  Administered 2020-11-01 – 2020-11-12 (×12): 400 [IU] via ORAL
  Filled 2020-10-30 (×13): qty 4

## 2020-10-30 MED ORDER — ASCORBIC ACID 500 MG PO TABS
500.0000 mg | ORAL_TABLET | Freq: Every day | ORAL | Status: DC
  Administered 2020-11-01 – 2020-11-12 (×12): 500 mg via ORAL
  Filled 2020-10-30 (×12): qty 1

## 2020-10-30 MED ORDER — CALCIUM CARBONATE-VITAMIN D 500-200 MG-UNIT PO TABS
1.0000 | ORAL_TABLET | Freq: Every day | ORAL | Status: DC
  Administered 2020-11-01 – 2020-11-12 (×12): 1 via ORAL
  Filled 2020-10-30 (×13): qty 1

## 2020-10-30 MED ORDER — ACETAMINOPHEN 325 MG PO TABS: 650.0000 mg | ORAL_TABLET | Freq: Four times a day (QID) | ORAL | Status: DC | PRN

## 2020-10-30 MED ORDER — ENSURE ENLIVE PO LIQD
237.0000 mL | Freq: Two times a day (BID) | ORAL | Status: DC
  Administered 2020-11-01 – 2020-11-11 (×21): 237 mL via ORAL

## 2020-10-30 MED ORDER — KETOROLAC TROMETHAMINE 30 MG/ML IJ SOLN
15.0000 mg | Freq: Once | INTRAMUSCULAR | Status: AC
  Administered 2020-10-30: 15 mg via INTRAVENOUS
  Filled 2020-10-30: qty 1

## 2020-10-30 MED ORDER — ALBUTEROL SULFATE HFA 108 (90 BASE) MCG/ACT IN AERS
2.0000 | INHALATION_SPRAY | Freq: Four times a day (QID) | RESPIRATORY_TRACT | Status: DC | PRN
  Filled 2020-10-30: qty 6.7

## 2020-10-30 MED ORDER — CEFAZOLIN SODIUM-DEXTROSE 2-4 GM/100ML-% IV SOLN
2.0000 g | Freq: Once | INTRAVENOUS | Status: AC
  Administered 2020-10-31: 2 g via INTRAVENOUS
  Filled 2020-10-30: qty 100

## 2020-10-30 MED ORDER — VITAMIN B-12 1000 MCG PO TABS
1000.0000 ug | ORAL_TABLET | Freq: Every day | ORAL | Status: DC
  Administered 2020-11-01 – 2020-11-12 (×12): 1000 ug via ORAL
  Filled 2020-10-30 (×13): qty 1

## 2020-10-30 MED ORDER — FENTANYL CITRATE (PF) 100 MCG/2ML IJ SOLN
50.0000 ug | INTRAMUSCULAR | Status: DC | PRN
  Administered 2020-10-31: 50 ug via INTRAVENOUS
  Filled 2020-10-30: qty 2

## 2020-10-30 MED ORDER — OXYBUTYNIN CHLORIDE 5 MG PO TABS
5.0000 mg | ORAL_TABLET | Freq: Three times a day (TID) | ORAL | Status: DC
  Administered 2020-10-30 – 2020-11-12 (×33): 5 mg via ORAL
  Filled 2020-10-30 (×42): qty 1

## 2020-10-30 MED ORDER — DONEPEZIL HCL 5 MG PO TABS
10.0000 mg | ORAL_TABLET | Freq: Two times a day (BID) | ORAL | Status: DC
  Administered 2020-10-30 – 2020-11-12 (×25): 10 mg via ORAL
  Filled 2020-10-30 (×25): qty 2

## 2020-10-30 MED ORDER — METHOCARBAMOL 1000 MG/10ML IJ SOLN
500.0000 mg | Freq: Four times a day (QID) | INTRAVENOUS | Status: DC | PRN
  Filled 2020-10-30: qty 5

## 2020-10-30 MED ORDER — CARBOXYMETHYLCELLUL-GLYCERIN 0.5-0.9 % OP SOLN
1.0000 [drp] | Freq: Four times a day (QID) | OPHTHALMIC | Status: DC | PRN
  Filled 2020-10-30: qty 15

## 2020-10-30 MED ORDER — CITALOPRAM HYDROBROMIDE 20 MG PO TABS
40.0000 mg | ORAL_TABLET | ORAL | Status: DC
  Filled 2020-10-30: qty 2

## 2020-10-30 MED ORDER — OCUVITE-LUTEIN PO CAPS
1.0000 | ORAL_CAPSULE | Freq: Two times a day (BID) | ORAL | Status: DC
  Administered 2020-10-31 – 2020-11-12 (×22): 1 via ORAL
  Filled 2020-10-30 (×29): qty 1

## 2020-10-30 MED ORDER — LEVOTHYROXINE SODIUM 50 MCG PO TABS
50.0000 ug | ORAL_TABLET | Freq: Every day | ORAL | Status: DC
  Administered 2020-11-01 – 2020-11-12 (×11): 50 ug via ORAL
  Filled 2020-10-30 (×9): qty 1
  Filled 2020-10-30: qty 2
  Filled 2020-10-30 (×2): qty 1

## 2020-10-30 MED ORDER — FLUTICASONE PROPIONATE 50 MCG/ACT NA SUSP
2.0000 | Freq: Every day | NASAL | Status: DC | PRN
  Filled 2020-10-30: qty 16

## 2020-10-30 MED ORDER — BUPROPION HCL ER (XL) 150 MG PO TB24
150.0000 mg | ORAL_TABLET | Freq: Every day | ORAL | Status: DC
  Administered 2020-11-01 – 2020-11-12 (×12): 150 mg via ORAL
  Filled 2020-10-30 (×13): qty 1

## 2020-10-30 MED ORDER — LAMOTRIGINE 25 MG PO TABS
50.0000 mg | ORAL_TABLET | Freq: Every day | ORAL | Status: DC
  Administered 2020-10-30 – 2020-11-11 (×11): 50 mg via ORAL
  Filled 2020-10-30 (×12): qty 2

## 2020-10-30 MED ORDER — BUSPIRONE HCL 10 MG PO TABS
15.0000 mg | ORAL_TABLET | Freq: Two times a day (BID) | ORAL | Status: DC
  Administered 2020-10-30 – 2020-11-12 (×24): 15 mg via ORAL
  Filled 2020-10-30 (×27): qty 2

## 2020-10-30 MED ORDER — METHOCARBAMOL 500 MG PO TABS
500.0000 mg | ORAL_TABLET | Freq: Four times a day (QID) | ORAL | Status: DC | PRN
  Filled 2020-10-30 (×2): qty 1

## 2020-10-30 MED ORDER — ROMIPLOSTIM INJECTION 500 MCG
10.0000 ug/kg | Freq: Once | SUBCUTANEOUS | Status: AC
  Administered 2020-10-30: 670 ug via SUBCUTANEOUS
  Filled 2020-10-30: qty 0.5

## 2020-10-30 MED ORDER — PANTOPRAZOLE SODIUM 20 MG PO TBEC
20.0000 mg | DELAYED_RELEASE_TABLET | Freq: Two times a day (BID) | ORAL | Status: DC
  Administered 2020-10-31 – 2020-11-12 (×24): 20 mg via ORAL
  Filled 2020-10-30 (×28): qty 1

## 2020-10-30 MED ORDER — MEMANTINE HCL 5 MG PO TABS
10.0000 mg | ORAL_TABLET | Freq: Two times a day (BID) | ORAL | Status: DC
  Administered 2020-10-30 – 2020-11-12 (×23): 10 mg via ORAL
  Filled 2020-10-30 (×24): qty 2

## 2020-10-30 MED ORDER — BENZONATATE 100 MG PO CAPS
200.0000 mg | ORAL_CAPSULE | Freq: Three times a day (TID) | ORAL | Status: DC
  Administered 2020-10-30 – 2020-11-12 (×34): 200 mg via ORAL
  Filled 2020-10-30 (×34): qty 2

## 2020-10-30 MED ORDER — GABAPENTIN 300 MG PO CAPS
300.0000 mg | ORAL_CAPSULE | Freq: Four times a day (QID) | ORAL | Status: DC
  Administered 2020-10-30 – 2020-11-12 (×44): 300 mg via ORAL
  Filled 2020-10-30 (×41): qty 1

## 2020-10-30 MED ORDER — SENNA 8.6 MG PO TABS
1.0000 | ORAL_TABLET | Freq: Two times a day (BID) | ORAL | Status: DC
  Administered 2020-10-30 – 2020-11-12 (×24): 8.6 mg via ORAL
  Filled 2020-10-30 (×23): qty 1

## 2020-10-30 NOTE — ED Notes (Signed)
Patient now sleeping, eyes closed, remains on continuous pulse ox and bp cuff, patient dinner tray placed at bedside

## 2020-10-30 NOTE — ED Notes (Signed)
Patient continues to sleep, vitals stable.

## 2020-10-30 NOTE — Telephone Encounter (Signed)
Pt spose called in regards to pt appt. for 10/30/20 Pt is in ER due to fall and broken hip. Pt's husband request return call at [phone].

## 2020-10-30 NOTE — ED Notes (Signed)
Ortho MD at bedside.

## 2020-10-30 NOTE — ED Notes (Signed)
Patient anxious, states she needs to go home, trying to get out of bed, bed alarm on bed, patient redirected, provided with ice water

## 2020-10-30 NOTE — H&P (Signed)
History and Physical    Ariana [name] BLT:[id number] DOB: [birth year] DOA: 10/30/2020  PCP: Idelle Crouch, MD   Patient coming from: Home  I have personally briefly reviewed patient's old medical records in [place]  Chief Complaint: Left hip pain  HPI: Ariana [name] is a 87 y.o. female with medical history significant for ITP, Alzheimer's dementia, depression, hypothyroidism, anxiety disorder and asthma who arrives to the ER via EMS for evaluation of left hip pain following a fall.  Patient was walking her dog and slipped on ice, landing on her left side.  She was unable to get up or bear weight on her left side and so EMS was called.  Patient with complaints of left shoulder, left hip and pain in the left side of the face. She denies any loss of consciousness or headache.  She denies having any chest pain, no shortness of breath, no nausea, no vomiting, no diarrhea, no abdominal pain, no urinary frequency, nocturia or dysuria.  No cough, no fever, no chills, no dizziness or lightheadedness. Labs show sodium 140, potassium 3.7, chloride 105, bicarb 25, glucose 109, BUN 20, creatinine 0.92, calcium 9.3, alk phos 90, albumin 3.7, AST 30, ALT 14, total protein 6.1, troponin 10, white count 13.5, hemoglobin 10.7, hematocrit 34.7, MCV 89, RDW 17, platelet count 32,, PT 13.3, INR 1.1 Respiratory viral panel is negative Chest x-ray reviewed by me shows no acute findings X-ray of the left hip shows cortical irregularity and slight angulation in the subcapital left femoral neck suspicious for nondisplaced subcapital left femoral neck fracture. No left hip dislocation. Consider left hip CT for further evaluation. CT scan of the pelvis without contrast shows LEFT femoral neck subcapital impaction fracture. Twelve-lead EKG reviewed by me shows sinus rhythm with PACs, left bundle branch block and left axis deviation.   ED Course: Patient is an 87 year old female with  a history of ITP, dementia and depression who presents to the ER for evaluation following a fall.  She has a left femoral neck fracture and will be admitted to the hospital for further evaluation.  Review of Systems: As per HPI otherwise all systems reviewed and negative.    Past Medical History:  Diagnosis Date  . Alzheimer disease ([place])   . Anemia   . Anxiety   . Asthma    WELL CONTROLLED  . Chronic back pain   . Depression   . Depression   . GERD (gastroesophageal reflux disease)   . Hiatal hernia   . Hypercholesteremia   . Hypothyroidism   . ITP (idiopathic thrombocytopenic purpura)    FOLLOWED BR DR Grayland Ormond  . LBBB (left bundle branch block)   . Osteoarthritis   . Osteoporosis   . Restless legs     Past Surgical History:  Procedure Laterality Date  . CARPAL TUNNEL RELEASE    . ESOPHAGOGASTRODUODENOSCOPY (EGD) WITH PROPOFOL N/A 09/10/2015   Procedure: ESOPHAGOGASTRODUODENOSCOPY (EGD) WITH PROPOFOL;  Surgeon: Lollie Sails, MD;  Location[facility] ENDOSCOPY;  Service: Endoscopy;  Laterality: N/A;  . IRRIGATION AND DEBRIDEMENT HEMATOMA Right 07/13/2018   Procedure: IRRIGATION AND DEBRIDEMENT HEMATOMA-RIGHT SHIN;  Surgeon: Herbert Pun, MD;  Location: [facility] ORS;  Service: General;  Laterality: Right;  . KYPHOPLASTY N/A 05/02/2019   Procedure: L1 KYPHOPLASTY;  Surgeon: Hessie Knows, MD;  Location: [facility] ORS;  Service: Orthopedics;  Laterality: N/A;  . LUMBAR [place]    . REVERSE SHOULDER ARTHROPLASTY Left 06/25/2020   Procedure: REVERSE SHOULDER ARTHROPLASTY;  Surgeon: Roland Rack,  Marshall Cork, MD;  Location: [facility] ORS;  Service: Orthopedics;  Laterality: Left;  . SPLENECTOMY, PARTIAL    . TOTAL ABDOMINAL HYSTERECTOMY       reports that she has never smoked. She has never used smokeless tobacco. She reports that she does not drink alcohol and does not use drugs.  Allergies  Allergen Reactions  . Aspirin     Upset stomach  . Diazepam Itching    sneezing  . Other      seasonal allergies  . Tetanus Toxoids     Localized superficial swelling of skin  . Morphine Itching and Rash    Family History  Problem Relation Age of Onset  . Stroke Mother      Prior to Admission medications   Medication Sig Start Date End Date Taking? Authorizing Provider  acetaminophen (TYLENOL) 325 MG tablet Take 2 tablets (650 mg total) by mouth every 6 (six) hours as needed for mild pain (or Fever >/= 101). 07/02/15  Yes Idelle Crouch, MD  albuterol (PROVENTIL HFA;VENTOLIN HFA) 108 (90 BASE) MCG/ACT inhaler Inhale 2 puffs into the lungs every 6 (six) hours as needed for wheezing or shortness of breath.   Yes [provider]  ALPRAZolam (XANAX) 0.5 MG tablet Take 1 tablet (0.5 mg total) by mouth 3 (three) times daily as needed for anxiety. 07/15/18  Yes Gladstone Lighter, MD  benzonatate (TESSALON) 200 MG capsule Take 200 mg by mouth 3 (three) times daily. 09/18/20  Yes [provider]  buPROPion (WELLBUTRIN XL) 150 MG 24 hr tablet Take 150 mg by mouth every morning.    Yes [provider]  busPIRone (BUSPAR) 15 MG tablet Take 15 mg by mouth 2 (two) times daily.  08/15/18  Yes [provider]  Calcium Carbonate-Vit D-Min (CALCIUM 600+D PLUS MINERALS) 600-400 MG-UNIT TABS Take 1 tablet by mouth daily.    Yes [provider]  Carboxymethylcellul-Glycerin (LUBRICATING EYE DROPS OP) Place 1 drop into both eyes daily as needed (dry eyes).   Yes [provider]  Cholecalciferol (VITAMIN D) 50 MCG (2000 UT) tablet Take 2,000 Units by mouth daily.    Yes [provider]  citalopram (CELEXA) 40 MG tablet Take 40 mg by mouth every morning.    Yes [provider]  Cyanocobalamin 1000 MCG TBCR Take 1,000 mcg by mouth daily.    Yes [provider]  donepezil (ARICEPT) 10 MG tablet Take 10 mg by mouth 2 (two) times daily.   Yes [provider]  feeding supplement, ENSURE ENLIVE, (ENSURE ENLIVE) LIQD Take  237 mLs by mouth 2 (two) times daily between meals. Patient taking differently: Take 237 mLs by mouth daily with breakfast. 02/18/20  Yes Nicole Kindred A, DO  fluticasone (FLONASE) 50 MCG/ACT nasal spray Place 2 sprays into both nostrils daily as needed for allergies.  03/03/17  Yes [provider]  Fluticasone-Salmeterol (ADVAIR) 250-50 MCG/DOSE AEPB Inhale 1 puff into the lungs 2 (two) times daily as needed (shortness of breath).    Yes [provider]  gabapentin (NEURONTIN) 300 MG capsule Take 300 mg by mouth 4 (four) times daily.   Yes [provider]  lamoTRIgine (LAMICTAL) 25 MG tablet Take 50 mg by mouth at bedtime. 08/05/20  Yes [provider]  levothyroxine (SYNTHROID, LEVOTHROID) 50 MCG tablet Take 50 mcg by mouth daily before breakfast. Take 30 to 60 minutes before breakfast.   Yes [provider]  memantine (NAMENDA) 10 MG tablet Take 10 mg by  mouth 2 (two) times daily.    Yes [provider]  montelukast (SINGULAIR) 10 MG tablet Take 10 mg by mouth at bedtime.    Yes [provider]  Multiple Vitamin (MULTI-VITAMIN) tablet Take 1 tablet by mouth daily.    Yes [provider]  Multiple Vitamins-Minerals (PRESERVISION AREDS 2) CAPS Take 1 capsule by mouth 2 (two) times a day.   Yes [provider]  NARCAN 4 MG/0.1ML LIQD nasal spray kit Place 1 spray into the nose once.  10/24/18  Yes [provider]  oxybutynin (DITROPAN) 5 MG tablet Take 5 mg by mouth 3 (three) times daily.   Yes [provider]  oxyCODONE-acetaminophen (PERCOCET) 10-325 MG tablet Take 1 tablet by mouth every 6 (six) hours as needed for moderate pain or severe pain. 02/09/20  Yes [provider]  pantoprazole (PROTONIX) 20 MG tablet Take 20 mg by mouth 2 (two) times daily.  07/24/15  Yes [provider]  pravastatin (PRAVACHOL) 20 MG tablet Take 20 mg by mouth at bedtime.    Yes [provider]   vitamin C (ASCORBIC ACID) 500 MG tablet Take 500 mg by mouth daily.   Yes [provider]  vitamin E 180 MG (400 UNITS) capsule Take 400 Units by mouth daily.   Yes [provider]    Physical Exam: Vitals:   10/30/20 0900 10/30/20 1000 10/30/20 1030 10/30/20 1210  BP: (!) 102/38 (!) 101/55 (!) 100/48 (!) 135/56  Pulse: 83 84 85 94  Resp: $Remo'20 14 13 16  'Qmupd$ Temp:      TempSrc:      SpO2: 99% 96% 93% 98%  Weight:      Height:         Vitals:   10/30/20 0900 10/30/20 1000 10/30/20 1030 10/30/20 1210  BP: (!) 102/38 (!) 101/55 (!) 100/48 (!) 135/56  Pulse: 83 84 85 94  Resp: $Remo'20 14 13 16  'gONVR$ Temp:      TempSrc:      SpO2: 99% 96% 93% 98%  Weight:      Height:        Constitutional: NAD, alert and oriented to person.  Hematoma of the left forehead Eyes: PERRL, lids and conjunctivae normal ENMT: Mucous membranes are moist.  Neck: normal, supple, no masses, no thyromegaly Respiratory: clear to auscultation bilaterally, no wheezing, no crackles. Normal respiratory effort. No accessory muscle use.  Cardiovascular: Regular rate and rhythm,no murmurs / rubs / gallops. No extremity edema. 2+ pedal pulses. No carotid bruits.  Abdomen: no tenderness, no masses palpated. No hepatosplenomegaly. Bowel sounds positive.  Musculoskeletal: no clubbing / cyanosis.  Decreased range of motion left hip, bruise over left elbow Skin: no rashes, lesions, ulcers.  Neurologic: No gross focal neurologic deficit. Psychiatric: Normal mood and affect.   Labs on Admission: I have personally reviewed following labs and imaging studies  CBC: Recent Labs  Lab 10/30/20 0850  WBC 13.5*  HGB 10.7*  HCT 34.7*  MCV 89.0  PLT 32*   Basic Metabolic Panel: Recent Labs  Lab 10/30/20 0850  NA 140  K 3.7  CL 105  CO2 25  GLUCOSE 109*  BUN 20  CREATININE 0.92  CALCIUM 9.3   GFR: Estimated Creatinine Clearance: 40.2 mL/min (by C-G formula based on SCr of 0.92 mg/dL). Liver Function  Tests: Recent Labs  Lab 10/30/20 0850  AST 30  ALT 14  ALKPHOS 90  BILITOT 0.5  PROT 6.1*  ALBUMIN 3.7   No  results for input(s): LIPASE, AMYLASE in the last 168 hours. No results for input(s): AMMONIA in the last 168 hours. Coagulation Profile: Recent Labs  Lab 10/30/20 0935  INR 1.1   Cardiac Enzymes: No results for input(s): CKTOTAL, CKMB, CKMBINDEX, TROPONINI in the last 168 hours. BNP (last 3 results) No results for input(s): PROBNP in the last 8760 hours. HbA1C: No results for input(s): HGBA1C in the last 72 hours. CBG: No results for input(s): GLUCAP in the last 168 hours. Lipid Profile: No results for input(s): CHOL, HDL, LDLCALC, TRIG, CHOLHDL, LDLDIRECT in the last 72 hours. Thyroid Function Tests: No results for input(s): TSH, T4TOTAL, FREET4, T3FREE, THYROIDAB in the last 72 hours. Anemia Panel: No results for input(s): VITAMINB12, FOLATE, FERRITIN, TIBC, IRON, RETICCTPCT in the last 72 hours. Urine analysis:    Component Value Date/Time   COLORURINE YELLOW (A) 06/19/2020 1150   APPEARANCEUR HAZY (A) 06/19/2020 1150   APPEARANCEUR Clear 09/03/2014 1200   LABSPEC 1.017 06/19/2020 1150   LABSPEC 1.018 09/03/2014 1200   PHURINE 8.0 06/19/2020 1150   GLUCOSEU NEGATIVE 06/19/2020 1150   GLUCOSEU Negative 09/03/2014 1200   HGBUR NEGATIVE 06/19/2020 1150   BILIRUBINUR NEGATIVE 06/19/2020 1150   BILIRUBINUR Negative 09/03/2014 1200   KETONESUR NEGATIVE 06/19/2020 1150   PROTEINUR NEGATIVE 06/19/2020 1150   NITRITE NEGATIVE 06/19/2020 1150   LEUKOCYTESUR NEGATIVE 06/19/2020 1150   LEUKOCYTESUR Negative 09/03/2014 1200    Radiological Exams on Admission: DG Chest 1 View  Result Date: 10/30/2020 CLINICAL DATA:  Fall. EXAM: CHEST  1 VIEW COMPARISON:  Chest x-ray dated August 18, 2020. FINDINGS: Unchanged mild cardiomegaly. Normal pulmonary vascularity. No focal consolidation, pleural effusion, or pneumothorax. No acute osseous abnormality. Prior left shoulder  reverse total arthroplasty. IMPRESSION: 1. No active disease. Electronically Signed   By: Titus Dubin M.D.   On: 10/30/2020 09:28   CT PELVIS WO CONTRAST  Result Date: 10/30/2020 CLINICAL DATA:  Slipped on ice. EXAM: CT PELVIS WITHOUT CONTRAST TECHNIQUE: Multidetector CT imaging of the pelvis was performed following the standard protocol without intravenous contrast. COMPARISON:  None. FINDINGS: Urinary Tract:  Unremarkable Bowel: Multiple diverticula sigmoid colon without acute inflammation. Vascular/Lymphatic: No pelvic adenopathy. Reproductive:  Post hysterectomy.  Adnexa unremarkable Other: Musculoskeletal: Subcapital femoral neck fracture with mild impaction. Minimal angulation. The joint is maintained. No hematoma. There is bruising in the subcutaneous tissue over the LEFT hip. No muscular hematoma. Posterior lumbar fusion IMPRESSION: LEFT femoral neck subcapital impaction fracture. Electronically Signed   By: Suzy Bouchard M.D.   On: 10/30/2020 10:06   DG Hip Unilat With Pelvis 2-3 Views Left  Result Date: 10/30/2020 CLINICAL DATA:  Fall on ice with left hip pain EXAM: DG HIP (WITH OR WITHOUT PELVIS) 2-3V LEFT COMPARISON:  None. FINDINGS: There is cortical irregularity and slight angulation in the subcapital left femoral neck suspicious for nondisplaced subcapital left femoral neck fracture. No additional potential fracture. No left hip dislocation. No focal osseous lesions. Partially visualized bilateral posterior spinal fusion hardware in the lower lumbar spine. No significant degenerative changes in the hip joints. IMPRESSION: Cortical irregularity and slight angulation in the subcapital left femoral neck suspicious for nondisplaced subcapital left femoral neck fracture. No left hip dislocation. Consider left hip CT for further evaluation. Electronically Signed   By: Ilona Sorrel M.D.   On: 10/30/2020 09:28    EKG: Independently reviewed.  Sinus rhythm with PACs Left axis deviation left  bundle branch block  Assessment/Plan Principal Problem:   Closed displaced fracture of  left femoral neck (HCC) Active Problems:   Chronic ITP (idiopathic thrombocytopenic purpura) (HCC)   Acquired hypothyroidism   Depressive disorder   Benign essential hypertension   Late onset Alzheimer's disease without behavioral disturbance (HCC)   Idiopathic thrombocythemia (HCC)   Hiatal hernia   GERD (gastroesophageal reflux disease)       Closed displaced fracture of left femoral neck Status post mechanical fall Immobilize left lower extremity Pain control Request orthopedic surgery consult Place patient on fall precautions    Idiopathic thrombocytopenic purpura Chronic Patient has a platelet count of 32,000 with no evidence of bleeding We will request hematology consult Patient received one dose of Nplate    Hypothyroidism Continue Synthroid   Alzheimer's dementia without behavioral disturbance Continue Aricept and Namenda   Depression and anxiety Continue as needed alprazolam Continue Wellbutrin and BuSpar    Hernia with GERD  Continue Protonix      DVT prophylaxis: SCD Code Status: DO NOT RESUSCITATE Family Communication: Greater than 50% was spent discussing patient's condition and plan of care with her husband who was at the bedside.  All questions and concerns have been addressed.  He verbalizes understanding and agrees with the plan.  CODE STATUS was discussed and she is a DO NOT RESUSCITATE Disposition Plan: Back to previous home environment Consults called: Orthopedic surgery    Takeria Marquina MD [facility]     10/30/2020, 12:38 PM

## 2020-10-30 NOTE — Consult Note (Signed)
ORTHOPAEDIC CONSULTATION  REQUESTING PHYSICIAN: Collier Bullock, MD  Chief Complaint:   L hip pain  History of Present Illness: History obtained from patient, chart review, and discussion with patient's husband and medical staff.  Ariana [name] is a 87 y.o. female who had a fall earlier today while slipping on ice after walking her dog.  The patient noted immediate hip pain and difficulty with ambulation.  The patient ambulates unassisted at baseline.  The patient lives at home with her husband. Pain is described as sharp at its worst and a dull ache at its best.  Pain is rated a 10 out of 10 in severity with movement.  Pain is improved with rest and immobilization.  X-rays in the emergency department show a left valgus-impacted femoral neck fracture.  Of note, she has a past medical history significant for Alzheimer's dementia and ITP (scheduled to receive treatment for this today).  Past Medical History:  Diagnosis Date  . Alzheimer disease ([place])   . Anemia   . Anxiety   . Asthma    WELL CONTROLLED  . Chronic back pain   . Depression   . Depression   . GERD (gastroesophageal reflux disease)   . Hiatal hernia   . Hypercholesteremia   . Hypothyroidism   . ITP (idiopathic thrombocytopenic purpura)    FOLLOWED BR DR Grayland Ormond  . LBBB (left bundle branch block)   . Osteoarthritis   . Osteoporosis   . Restless legs    Past Surgical History:  Procedure Laterality Date  . CARPAL TUNNEL RELEASE    . ESOPHAGOGASTRODUODENOSCOPY (EGD) WITH PROPOFOL N/A 09/10/2015   Procedure: ESOPHAGOGASTRODUODENOSCOPY (EGD) WITH PROPOFOL;  Surgeon: Lollie Sails, MD;  Location: [facility] ENDOSCOPY;  Service: Endoscopy;  Laterality: N/A;  . IRRIGATION AND DEBRIDEMENT HEMATOMA Right 07/13/2018   Procedure: IRRIGATION AND DEBRIDEMENT HEMATOMA-RIGHT SHIN;  Surgeon: Herbert Pun, MD;  Location: [facility] ORS;  Service: General;   Laterality: Right;  . KYPHOPLASTY N/A 05/02/2019   Procedure: L1 KYPHOPLASTY;  Surgeon: Hessie Knows, MD;  Location: [facility] ORS;  Service: Orthopedics;  Laterality: N/A;  . LUMBAR [place]    . REVERSE SHOULDER ARTHROPLASTY Left 06/25/2020   Procedure: REVERSE SHOULDER ARTHROPLASTY;  Surgeon: Corky Mull, MD;  Location: [facility] ORS;  Service: Orthopedics;  Laterality: Left;  . SPLENECTOMY, PARTIAL    . TOTAL ABDOMINAL HYSTERECTOMY     Social History   Socioeconomic History  . Marital status: Married    Spouse name: Not on file  . Number of children: Not on file  . Years of education: Not on file  . Highest education level: Not on file  Occupational History  . Not on file  Tobacco Use  . Smoking status: Never Smoker  . Smokeless tobacco: Never Used  Vaping Use  . Vaping Use: Never used  Substance and Sexual Activity  . Alcohol use: No    Alcohol/week: 0.0 standard drinks  . Drug use: No  . Sexual activity: Not on file  Other Topics Concern  . Not on file  Social History Narrative  . Not on file   Social Determinants of Health   Financial Resource Strain: Not on file  Food Insecurity: Not on file  Transportation Needs: Not on file  Physical Activity: Not on file  Stress: Not on file  Social Connections: Not on file   Family History  Problem Relation Age of Onset  . Stroke Mother    Allergies  Allergen Reactions  . Aspirin  Upset stomach  . Diazepam Itching    sneezing  . Other     seasonal allergies  . Tetanus Toxoids     Localized superficial swelling of skin  . Morphine Itching and Rash   Prior to Admission medications   Medication Sig Start Date End Date Taking? Authorizing Provider  acetaminophen (TYLENOL) 325 MG tablet Take 2 tablets (650 mg total) by mouth every 6 (six) hours as needed for mild pain (or Fever >/= 101). 07/02/15  Yes Idelle Crouch, MD  albuterol (PROVENTIL HFA;VENTOLIN HFA) 108 (90 BASE) MCG/ACT inhaler Inhale 2 puffs into the  lungs every 6 (six) hours as needed for wheezing or shortness of breath.   Yes [provider]  ALPRAZolam (XANAX) 0.5 MG tablet Take 1 tablet (0.5 mg total) by mouth 3 (three) times daily as needed for anxiety. 07/15/18  Yes Gladstone Lighter, MD  benzonatate (TESSALON) 200 MG capsule Take 200 mg by mouth 3 (three) times daily. 09/18/20  Yes [provider]  buPROPion (WELLBUTRIN XL) 150 MG 24 hr tablet Take 150 mg by mouth every morning.    Yes [provider]  busPIRone (BUSPAR) 15 MG tablet Take 15 mg by mouth 2 (two) times daily.  08/15/18  Yes [provider]  Calcium Carbonate-Vit D-Min (CALCIUM 600+D PLUS MINERALS) 600-400 MG-UNIT TABS Take 1 tablet by mouth daily.    Yes [provider]  Carboxymethylcellul-Glycerin (LUBRICATING EYE DROPS OP) Place 1 drop into both eyes daily as needed (dry eyes).   Yes [provider]  Cholecalciferol (VITAMIN D) 50 MCG (2000 UT) tablet Take 2,000 Units by mouth daily.    Yes [provider]  citalopram (CELEXA) 40 MG tablet Take 40 mg by mouth every morning.    Yes [provider]  Cyanocobalamin 1000 MCG TBCR Take 1,000 mcg by mouth daily.    Yes [provider]  donepezil (ARICEPT) 10 MG tablet Take 10 mg by mouth 2 (two) times daily.   Yes [provider]  feeding supplement, ENSURE ENLIVE, (ENSURE ENLIVE) LIQD Take 237 mLs by mouth 2 (two) times daily between meals. Patient taking differently: Take 237 mLs by mouth daily with breakfast. 02/18/20  Yes Nicole Kindred A, DO  fluticasone (FLONASE) 50 MCG/ACT nasal spray Place 2 sprays into both nostrils daily as needed for allergies.  03/03/17  Yes [provider]  Fluticasone-Salmeterol (ADVAIR) 250-50 MCG/DOSE AEPB Inhale 1 puff into the lungs 2 (two) times daily as needed (shortness of breath).    Yes [provider]  gabapentin (NEURONTIN) 300 MG capsule Take 300 mg by mouth 4 (four) times daily.    Yes [provider]  lamoTRIgine (LAMICTAL) 25 MG tablet Take 50 mg by mouth at bedtime. 08/05/20  Yes [provider]  levothyroxine (SYNTHROID, LEVOTHROID) 50 MCG tablet Take 50 mcg by mouth daily before breakfast. Take 30 to 60 minutes before breakfast.   Yes [provider]  memantine (NAMENDA) 10 MG tablet Take 10 mg by mouth 2 (two) times daily.    Yes [provider]  montelukast (SINGULAIR) 10 MG tablet Take 10 mg by mouth at bedtime.    Yes [provider]  Multiple Vitamin (MULTI-VITAMIN) tablet Take 1 tablet by mouth daily.    Yes [provider]  Multiple Vitamins-Minerals (PRESERVISION AREDS 2) CAPS Take 1 capsule by mouth 2 (two) times a day.   Yes [provider]  NARCAN 4 MG/0.1ML LIQD nasal spray kit Place 1 spray into  the nose once.  10/24/18  Yes [provider]  oxybutynin (DITROPAN) 5 MG tablet Take 5 mg by mouth 3 (three) times daily.   Yes [provider]  oxyCODONE-acetaminophen (PERCOCET) 10-325 MG tablet Take 1 tablet by mouth every 6 (six) hours as needed for moderate pain or severe pain. 02/09/20  Yes [provider]  pantoprazole (PROTONIX) 20 MG tablet Take 20 mg by mouth 2 (two) times daily.  07/24/15  Yes [provider]  pravastatin (PRAVACHOL) 20 MG tablet Take 20 mg by mouth at bedtime.    Yes [provider]  vitamin C (ASCORBIC ACID) 500 MG tablet Take 500 mg by mouth daily.   Yes [provider]  vitamin E 180 MG (400 UNITS) capsule Take 400 Units by mouth daily.   Yes [provider]   Recent Labs    10/30/20 0850 10/30/20 0935  WBC 13.5*  --   HGB 10.7*  --   HCT 34.7*  --   PLT 32*  --   K 3.7  --   CL 105  --   CO2 25  --   BUN 20  --   CREATININE 0.92  --   GLUCOSE 109*  --   CALCIUM 9.3  --   INR  --  1.1   DG Chest 1 View  Result Date: 10/30/2020 CLINICAL DATA:  Fall. EXAM: CHEST  1 VIEW COMPARISON:  Chest x-ray  dated August 18, 2020. FINDINGS: Unchanged mild cardiomegaly. Normal pulmonary vascularity. No focal consolidation, pleural effusion, or pneumothorax. No acute osseous abnormality. Prior left shoulder reverse total arthroplasty. IMPRESSION: 1. No active disease. Electronically Signed   By: Titus Dubin M.D.   On: 10/30/2020 09:28   CT PELVIS WO CONTRAST  Result Date: 10/30/2020 CLINICAL DATA:  Slipped on ice. EXAM: CT PELVIS WITHOUT CONTRAST TECHNIQUE: Multidetector CT imaging of the pelvis was performed following the standard protocol without intravenous contrast. COMPARISON:  None. FINDINGS: Urinary Tract:  Unremarkable Bowel: Multiple diverticula sigmoid colon without acute inflammation. Vascular/Lymphatic: No pelvic adenopathy. Reproductive:  Post hysterectomy.  Adnexa unremarkable Other: Musculoskeletal: Subcapital femoral neck fracture with mild impaction. Minimal angulation. The joint is maintained. No hematoma. There is bruising in the subcutaneous tissue over the LEFT hip. No muscular hematoma. Posterior lumbar fusion IMPRESSION: LEFT femoral neck subcapital impaction fracture. Electronically Signed   By: Suzy Bouchard M.D.   On: 10/30/2020 10:06   DG Hip Unilat With Pelvis 2-3 Views Left  Result Date: 10/30/2020 CLINICAL DATA:  Fall on ice with left hip pain EXAM: DG HIP (WITH OR WITHOUT PELVIS) 2-3V LEFT COMPARISON:  None. FINDINGS: There is cortical irregularity and slight angulation in the subcapital left femoral neck suspicious for nondisplaced subcapital left femoral neck fracture. No additional potential fracture. No left hip dislocation. No focal osseous lesions. Partially visualized bilateral posterior spinal fusion hardware in the lower lumbar spine. No significant degenerative changes in the hip joints. IMPRESSION: Cortical irregularity and slight angulation in the subcapital left femoral neck suspicious for nondisplaced subcapital left femoral neck fracture. No left hip  dislocation. Consider left hip CT for further evaluation. Electronically Signed   By: Ilona Sorrel M.D.   On: 10/30/2020 09:28     Positive ROS: All other systems have been reviewed and were otherwise negative with the exception of those mentioned in the HPI and as above.  Physical Exam: BP (!) 135/56   Pulse 94   Temp 97.7 F (36.5 C) (Oral)   Resp  16   Ht $R'5\' 2"'iN$  (1.575 m)   Wt 67.1 kg   SpO2 98%   BMI 27.07 kg/m  General:  Alert, no acute distress, oriented to name and place, but not time Psychiatric:  Patient is NOT competent for consent, non-agitated Cardiovascular:  No pedal edema, regular rate and rhythm Respiratory:  No wheezing, non-labored breathing GI:  Abdomen is soft and non-tender Skin:  No lesions in the area of chief complaint, no erythema Neurologic:  Sensation intact distally, CN grossly intact Lymphatic:  No axillary or cervical lymphadenopathy  Orthopedic Exam:  LLE: + DF/PF/EHL SILT grossly over foot Foot wwp +Log roll/axial load  X-rays:  As above: L valgus-impacted femoral neck fracture  Assessment/Plan: Giovanni Bath is a 87 y.o. female with a L valgus-impacted femoral neck fracture   1. I discussed the various treatment options including both surgical and non-surgical management of the fracture with the patient and her husband (medical PoA). We discussed the high risk of perioperative complications due to patient's age, dementia, and other co-morbidities. After discussion of risks, benefits, and alternatives to surgery, the family and patient were in agreement to proceed with surgery. The goals of surgery would be to provide adequate pain relief and allow for mobilization. Plan for surgery is L hip percutaneous pinning tomorrow, 10/31/20 around noon. Surgical consent discussed over the phone with patient's husband. Please witness consent form.  2. NPO after midnight 3. Hold anticoagulation in advance of OR 4. Admit to Hospitalist service  for medical optimization. Hematology consult for ITP and low platelet count. I anticipate surgery to last ~1 hour with ~50-100cc EBL. She may have occult blood loss due to fracture itself. Can consider dose of TXA today.    Leim Fabry   10/30/2020 12:43 PM

## 2020-10-30 NOTE — ED Notes (Signed)
Patient agitated and yelling out, upon entering room patient reports she needs Ariana [name], patient informed that no one named Ariana [name] is here at this time, patient redirected back to situation. Patient now calm and watching TV.

## 2020-10-30 NOTE — ED Triage Notes (Addendum)
Pt arrives [facility] from home. Pt was walking her dog and slipped on ice. Injured L side. Bruise to L face, L shoulder. Hx low platelets. C/o L hip pain. Arrives with 20g IV to R FA. EMS gave 36mcg fentanyl. Arrives [facility].

## 2020-10-30 NOTE — Telephone Encounter (Signed)
Call returned to patients husband. I let him know that you had reached out the ED provider and were aware of what is going on with patient, working on facilitating her getting nplate in the hospital. I told him we would plan f/u in 4 weeks as always but to let us know if anything changes.

## 2020-10-30 NOTE — ED Provider Notes (Signed)
[facility] Emergency Department Provider Note   ____________________________________________   Event Date/Time   First MD Initiated Contact with Patient 10/30/20 [phone]     (approximate)  I have reviewed the triage vital signs and the nursing notes.   HISTORY  Chief Complaint Fall    HPI Ariana [name] is a 87 y.o. female with a past medical history of Alzheimer's disease, chronic ITP, and osteoporosis who presents after a mechanical fall from standing when she slipped on ice outside walking her dog this morning and fell onto her left side.  Patient now complains of left shoulder, left hip, and left facial pain.  Patient's only concern is mostly from the left hip where she states it is 8/10 pain with any movement or palpation over this hip.  Patient denies any numbness/tingling distally.  Patient denies any color change in this extremity.  Patient denies any loss of consciousness or blood thinner use.         Past Medical History:  Diagnosis Date  . Alzheimer disease ([place])   . Anemia   . Anxiety   . Asthma    WELL CONTROLLED  . Chronic back pain   . Depression   . Depression   . GERD (gastroesophageal reflux disease)   . Hiatal hernia   . Hypercholesteremia   . Hypothyroidism   . ITP (idiopathic thrombocytopenic purpura)    FOLLOWED BR DR Grayland Ormond  . LBBB (left bundle branch block)   . Osteoarthritis   . Osteoporosis   . Restless legs     Patient Active Problem List   Diagnosis Date Noted  . Closed displaced fracture of left femoral neck ([place]) 10/30/2020  . Rectal bleeding 08/18/2020  . Leukocytosis 08/18/2020  . Failure to thrive in adult 02/16/2020  . Thrombocytopenia ([place]) 02/15/2020  . Pneumonia 07/10/2019  . Laceration of left lower leg 04/10/2019  . Chronic pain disorder 01/11/2019  . OSA (obstructive sleep apnea) 09/28/2018  . Ulcer of lower limb, right, limited to breakdown of skin ([place]) 07/22/2018  . Traumatic  hematoma of right lower leg 07/12/2018  . Asthma without status asthmaticus 11/25/2016  . Late onset Alzheimer's disease without behavioral disturbance ([place]) 12/30/2015  . Back muscle spasm 09/09/2015  . Clinical depression 04/12/2015  . Herpes zona 04/12/2015  . Chronic ITP (idiopathic thrombocytopenic purpura) ([facility]) 03/15/2015  . Chest pain 10/22/2014  . Breathlessness on exertion 10/22/2014  . Osteopenia 10/18/2014  . Benign essential hypertension 06/28/2014  . HLD (hyperlipidemia) 06/28/2014  . Idiopathic thrombocythemia ([place]) 06/28/2014  . Acquired hypothyroidism 06/04/2014  . Depressive disorder 06/04/2014  . Bursitis, trochanteric 03/27/2014  . DDD (degenerative disc disease), lumbar 02/27/2014  . Neuritis or radiculitis due to rupture of lumbar intervertebral disc 02/27/2014  . Lumbar radiculitis 02/27/2014    Past Surgical History:  Procedure Laterality Date  . CARPAL TUNNEL RELEASE    . ESOPHAGOGASTRODUODENOSCOPY (EGD) WITH PROPOFOL N/A 09/10/2015   Procedure: ESOPHAGOGASTRODUODENOSCOPY (EGD) WITH PROPOFOL;  Surgeon: Lollie Sails, MD;  Location: [facility] ENDOSCOPY;  Service: Endoscopy;  Laterality: N/A;  . IRRIGATION AND DEBRIDEMENT HEMATOMA Right 07/13/2018   Procedure: IRRIGATION AND DEBRIDEMENT HEMATOMA-RIGHT SHIN;  Surgeon: Herbert Pun, MD;  Location: [facility] ORS;  Service: General;  Laterality: Right;  . KYPHOPLASTY N/A 05/02/2019   Procedure: L1 KYPHOPLASTY;  Surgeon: Hessie Knows, MD;  Location: [facility] ORS;  Service: Orthopedics;  Laterality: N/A;  . LUMBAR [place]    . REVERSE SHOULDER ARTHROPLASTY Left 06/25/2020   Procedure: REVERSE SHOULDER  ARTHROPLASTY;  Surgeon: Corky Mull, MD;  Location: [facility] ORS;  Service: Orthopedics;  Laterality: Left;  . SPLENECTOMY, PARTIAL    . TOTAL ABDOMINAL HYSTERECTOMY      Prior to Admission medications   Medication Sig Start Date End Date Taking? Authorizing Provider  acetaminophen (TYLENOL) 325 MG tablet Take 2  tablets (650 mg total) by mouth every 6 (six) hours as needed for mild pain (or Fever >/= 101). 07/02/15   Idelle Crouch, MD  albuterol (PROVENTIL HFA;VENTOLIN HFA) 108 (90 BASE) MCG/ACT inhaler Inhale 2 puffs into the lungs every 6 (six) hours as needed for wheezing or shortness of breath.    [provider]  ALPRAZolam Duanne Moron) 0.5 MG tablet Take 1 tablet (0.5 mg total) by mouth 3 (three) times daily as needed for anxiety. 07/15/18   Gladstone Lighter, MD  buPROPion (WELLBUTRIN XL) 150 MG 24 hr tablet Take 150 mg by mouth every morning.     [provider]  busPIRone (BUSPAR) 15 MG tablet Take 15 mg by mouth 2 (two) times daily.  08/15/18   [provider]  Calcium Carbonate-Vit D-Min (CALCIUM 600+D PLUS MINERALS) 600-400 MG-UNIT TABS Take 1 tablet by mouth daily.     [provider]  Carboxymethylcellul-Glycerin (LUBRICATING EYE DROPS OP) Place 1 drop into both eyes daily as needed (dry eyes).    [provider]  Cholecalciferol (VITAMIN D) 50 MCG (2000 UT) tablet Take 2,000 Units by mouth daily.     [provider]  citalopram (CELEXA) 40 MG tablet Take 40 mg by mouth every morning.     [provider]  Cyanocobalamin 1000 MCG TBCR Take 1,000 mcg by mouth daily.     [provider]  donepezil (ARICEPT) 10 MG tablet Take 10 mg by mouth at bedtime.     [provider]  feeding supplement, ENSURE ENLIVE, (ENSURE ENLIVE) LIQD Take 237 mLs by mouth 2 (two) times daily between meals. Patient taking differently: Take 237 mLs by mouth daily with breakfast. 02/18/20   Nicole Kindred A, DO  fluticasone (FLONASE) 50 MCG/ACT nasal spray Place 2 sprays into both nostrils daily as needed for allergies.  03/03/17   [provider]  Fluticasone-Salmeterol (ADVAIR) 250-50 MCG/DOSE AEPB Inhale 1 puff into the lungs 2 (two) times daily as needed (shortness of breath).     [provider]  gabapentin (NEURONTIN) 300 MG  capsule Take 300 mg by mouth 4 (four) times daily.    [provider]  lamoTRIgine (LAMICTAL) 25 MG tablet Take 50 mg by mouth at bedtime. 08/05/20   [provider]  levothyroxine (SYNTHROID, LEVOTHROID) 50 MCG tablet Take 50 mcg by mouth daily before breakfast. Take 30 to 60 minutes before breakfast.    [provider]  memantine (NAMENDA) 10 MG tablet Take 10 mg by mouth 2 (two) times daily.     [provider]  montelukast (SINGULAIR) 10 MG tablet Take 10 mg by mouth at bedtime.     [provider]  Multiple Vitamin (MULTI-VITAMIN) tablet Take 1 tablet by mouth daily.     [provider]  Multiple Vitamins-Minerals (PRESERVISION AREDS 2) CAPS Take 1 capsule by mouth 2 (two) times a day.    [provider]  NARCAN 4 MG/0.1ML LIQD nasal spray kit Place 1 spray into the nose once.  10/24/18   [provider]  oxybutynin (DITROPAN) 5 MG tablet Take 5 mg by mouth 3 (three) times daily.    [provider]  oxyCODONE (ROXICODONE) 5 MG immediate release tablet Take 1 tablet (5 mg total) by mouth every 4 (four) hours as needed for breakthrough pain. 06/25/20   Poggi, Marshall Cork, MD  oxyCODONE-acetaminophen (PERCOCET) 10-325 MG tablet Take 1 tablet by mouth every 6 (six) hours as needed for moderate pain or severe pain. 02/09/20   [provider]  pantoprazole (PROTONIX) 20 MG tablet Take 20 mg by mouth 2 (two) times daily.  07/24/15   [provider]  pravastatin (PRAVACHOL) 20 MG tablet Take 20 mg by mouth at bedtime.     [provider]  vitamin C (ASCORBIC ACID) 500 MG tablet Take 500 mg by mouth daily.    [provider]  vitamin E 180 MG (400 UNITS) capsule Take 400 Units by mouth daily.    [provider]    Allergies Aspirin, Diazepam, Other, Tetanus toxoids, and Morphine  Family History  Problem Relation Age of Onset  . Stroke Mother     Social History Social History    Tobacco Use  . Smoking status: Never Smoker  . Smokeless tobacco: Never Used  Vaping Use  . Vaping Use: Never used  Substance Use Topics  . Alcohol use: No    Alcohol/week: 0.0 standard drinks  . Drug use: No    Review of Systems Constitutional: No fever/chills Eyes: No visual changes. ENT: No sore throat. Cardiovascular: Denies chest pain. Respiratory: Denies shortness of breath. Gastrointestinal: No abdominal pain.  No nausea, no vomiting.  No diarrhea. Genitourinary: Negative for dysuria. Musculoskeletal: Positive for left hip, left shoulder, and left facial pain Skin: Negative for rash. Neurological: Negative for headaches, weakness/numbness/paresthesias in any extremity Psychiatric: Negative for suicidal ideation/homicidal ideation   ____________________________________________   PHYSICAL EXAM:  VITAL SIGNS: ED Triage Vitals  Enc Vitals Group     BP 10/30/20 0852 (!) 159/135     Pulse Rate 10/30/20 0852 82     Resp 10/30/20 0852 20     Temp 10/30/20 0852 97.7 F (36.5 C)     Temp Source 10/30/20 0852 Oral     SpO2 10/30/20 0852 96 %     Weight 10/30/20 0846 148 lb (67.1 kg)     Height 10/30/20 0846 $RemoveBefor'5\' 2"'[id number]$  (1.575 m)     Head Circumference --      Peak Flow --      Pain Score 10/30/20 0845 10     Pain Loc --      Pain Edu? --      Excl. in [place]? --    Constitutional: Alert and oriented. Well appearing and in no acute distress. Eyes: Conjunctivae are normal. PERRL. Head: Atraumatic. Nose: No congestion/rhinnorhea. Mouth/Throat: Mucous membranes are moist. Neck: No stridor Cardiovascular: Grossly normal heart sounds.  Good peripheral circulation. Respiratory: Normal respiratory effort.  No retractions. Gastrointestinal: Soft and nontender. No distention. Musculoskeletal: Left leg held in external rotation with significant pain with any active or passive range of motion.  This extremity is distally neurovascularly intact Neurologic:  Normal speech and  language. No gross focal neurologic deficits are appreciated. Skin:  Skin is warm and dry. No rash noted. Psychiatric: Mood and affect are normal. Speech and behavior are normal.  ____________________________________________   LABS (all labs ordered are listed, but only abnormal results are displayed)  Labs Reviewed  CBC - Abnormal; Notable for the following components:      Result Value   WBC 13.5 (*)    Hemoglobin 10.7 (*)  HCT 34.7 (*)    RDW 17.9 (*)    Platelets 32 (*)    All other components within normal limits  COMPREHENSIVE METABOLIC PANEL - Abnormal; Notable for the following components:   Glucose, Bld 109 (*)    Total Protein 6.1 (*)    All other components within normal limits  RESP PANEL BY RT-PCR (FLU A&B, COVID) ARPGX2  PROTIME-INR  TROPONIN I (HIGH SENSITIVITY)  TROPONIN I (HIGH SENSITIVITY)   ____________________________________________  EKG  ED ECG REPORT I, Naaman Plummer, the attending physician, personally viewed and interpreted this ECG.  Date: 10/30/2020 EKG Time: 0847 Rate: 80 Rhythm: normal sinus rhythm QRS Axis: normal Intervals: Left bundle branch block ST/T Wave abnormalities: normal Narrative Interpretation: no evidence of acute ischemia  ____________________________________________  RADIOLOGY  ED MD interpretation: 1 view chest x-ray shows no evidence of acute abnormalities including no fractures, dislocations, pneumonia, pneumothorax, or widened mediastinum.  2 view x-ray of the left pelvis shows likely subcapital femoral neck fracture without dislocation  CT of the pelvis without contrast shows left femoral neck subcapital impaction fracture without dislocation  Official radiology report(s): DG Chest 1 View  Result Date: 10/30/2020 CLINICAL DATA:  Fall. EXAM: CHEST  1 VIEW COMPARISON:  Chest x-ray dated August 18, 2020. FINDINGS: Unchanged mild cardiomegaly. Normal pulmonary vascularity. No focal consolidation, pleural  effusion, or pneumothorax. No acute osseous abnormality. Prior left shoulder reverse total arthroplasty. IMPRESSION: 1. No active disease. Electronically Signed   By: Titus Dubin M.D.   On: 10/30/2020 09:28   CT PELVIS WO CONTRAST  Result Date: 10/30/2020 CLINICAL DATA:  Slipped on ice. EXAM: CT PELVIS WITHOUT CONTRAST TECHNIQUE: Multidetector CT imaging of the pelvis was performed following the standard protocol without intravenous contrast. COMPARISON:  None. FINDINGS: Urinary Tract:  Unremarkable Bowel: Multiple diverticula sigmoid colon without acute inflammation. Vascular/Lymphatic: No pelvic adenopathy. Reproductive:  Post hysterectomy.  Adnexa unremarkable Other: Musculoskeletal: Subcapital femoral neck fracture with mild impaction. Minimal angulation. The joint is maintained. No hematoma. There is bruising in the subcutaneous tissue over the LEFT hip. No muscular hematoma. Posterior lumbar fusion IMPRESSION: LEFT femoral neck subcapital impaction fracture. Electronically Signed   By: Suzy Bouchard M.D.   On: 10/30/2020 10:06   DG Hip Unilat With Pelvis 2-3 Views Left  Result Date: 10/30/2020 CLINICAL DATA:  Fall on ice with left hip pain EXAM: DG HIP (WITH OR WITHOUT PELVIS) 2-3V LEFT COMPARISON:  None. FINDINGS: There is cortical irregularity and slight angulation in the subcapital left femoral neck suspicious for nondisplaced subcapital left femoral neck fracture. No additional potential fracture. No left hip dislocation. No focal osseous lesions. Partially visualized bilateral posterior spinal fusion hardware in the lower lumbar spine. No significant degenerative changes in the hip joints. IMPRESSION: Cortical irregularity and slight angulation in the subcapital left femoral neck suspicious for nondisplaced subcapital left femoral neck fracture. No left hip dislocation. Consider left hip CT for further evaluation. Electronically Signed   By: Ilona Sorrel M.D.   On: 10/30/2020 09:28     ____________________________________________   PROCEDURES  Procedure(s) performed (including Critical Care):  Procedures   ____________________________________________   INITIAL IMPRESSION / ASSESSMENT AND PLAN / ED COURSE  As part of my medical decision making, I reviewed the following data within the [place] notes reviewed and incorporated, Labs reviewed, EKG interpreted, Old chart reviewed, Radiograph reviewed and Notes from prior ED visits reviewed and incorporated        Patient is a 87 year old  female that presents for left hip pain Workup: XR hip Findings: Left femoral subcapital impaction fracture without dislocation Consult: Orthopedic Surgery, hospitalist  Patient does not currently demonstrate complications of fracture such as compartment syndrome, arterial or nerve injury.  Interventions: analgesia Disposition: Admit      ____________________________________________   FINAL CLINICAL IMPRESSION(S) / ED DIAGNOSES  Final diagnoses:  Closed fracture of neck of left femur, initial encounter ([place])  Fall, initial encounter     ED Discharge Orders    None       Note:  This document was prepared using Dragon voice recognition software and may include unintentional dictation errors.   Naaman Plummer, MD 10/30/20 1056

## 2020-10-31 ENCOUNTER — Inpatient Hospital Stay: Payer: Medicare HMO | Admitting: Anesthesiology

## 2020-10-31 ENCOUNTER — Inpatient Hospital Stay: Payer: Medicare HMO

## 2020-10-31 ENCOUNTER — Encounter
Admission: EM | Disposition: A | Discharge status: Home Health | Payer: Self-pay | Source: Home / Self Care | Attending: Internal Medicine

## 2020-10-31 DIAGNOSIS — D693 Immune thrombocytopenic purpura: Secondary | ICD-10-CM | POA: Diagnosis not present

## 2020-10-31 DIAGNOSIS — S72002A Fracture of unspecified part of neck of left femur, initial encounter for closed fracture: Secondary | ICD-10-CM | POA: Diagnosis not present

## 2020-10-31 DIAGNOSIS — F0390 Unspecified dementia without behavioral disturbance: Secondary | ICD-10-CM

## 2020-10-31 DIAGNOSIS — F32A Depression, unspecified: Secondary | ICD-10-CM

## 2020-10-31 DIAGNOSIS — E039 Hypothyroidism, unspecified: Secondary | ICD-10-CM | POA: Diagnosis not present

## 2020-10-31 HISTORY — PX: HIP PINNING,CANNULATED: SHX1758

## 2020-10-31 LAB — BASIC METABOLIC PANEL
Anion gap: 6 (ref 5–15)
BUN: 17 mg/dL (ref 8–23)
CO2: 30 mmol/L (ref 22–32)
Calcium: 9 mg/dL (ref 8.9–10.3)
Chloride: 107 mmol/L (ref 98–111)
Creatinine, Ser: 0.83 mg/dL (ref 0.44–1.00)
GFR, Estimated: >60 mL/min (ref >60)
Glucose, Bld: 84 mg/dL (ref 70–99)
Potassium: 4.1 mmol/L (ref 3.5–5.1)
Sodium: 143 mmol/L (ref 135–145)

## 2020-10-31 LAB — SURGICAL PCR SCREEN
MRSA, PCR: POSITIVE — AB
Staphylococcus aureus: POSITIVE — AB

## 2020-10-31 LAB — CBC WITH DIFFERENTIAL/PLATELET
Abs Immature Granulocytes: 0.04 10*3/uL (ref 0.00–0.07)
Basophils Absolute: 0.1 10*3/uL (ref 0.0–0.1)
Basophils Relative: 1 %
Eosinophils Absolute: 0.3 10*3/uL (ref 0.0–0.5)
Eosinophils Relative: 2 %
HCT: 35.1 % — ABNORMAL LOW (ref 36.0–46.0)
Hemoglobin: 11.3 g/dL — ABNORMAL LOW (ref 12.0–15.0)
Immature Granulocytes: 0 %
Lymphocytes Relative: 11 %
Lymphs Abs: 1.5 10*3/uL (ref 0.7–4.0)
MCH: 28 pg (ref 26.0–34.0)
MCHC: 32.2 g/dL (ref 30.0–36.0)
MCV: 86.9 fL (ref 80.0–100.0)
Monocytes Absolute: 1.1 10*3/uL — ABNORMAL HIGH (ref 0.1–1.0)
Monocytes Relative: 9 %
Neutro Abs: 10.2 10*3/uL — ABNORMAL HIGH (ref 1.7–7.7)
Neutrophils Relative %: 77 %
Platelets: 18 10*3/uL — CL (ref 150–400)
RBC: 4.04 MIL/uL (ref 3.87–5.11)
RDW: 18.2 % — ABNORMAL HIGH (ref 11.5–15.5)
Smear Review: DECREASED
WBC: 13.2 10*3/uL — ABNORMAL HIGH (ref 4.0–10.5)
nRBC: 0 % (ref 0.0–0.2)

## 2020-10-31 LAB — CBC
HCT: 33.7 % — ABNORMAL LOW (ref 36.0–46.0)
Hemoglobin: 10.6 g/dL — ABNORMAL LOW (ref 12.0–15.0)
MCH: 27.7 pg (ref 26.0–34.0)
MCHC: 31.5 g/dL (ref 30.0–36.0)
MCV: 88 fL (ref 80.0–100.0)
Platelets: UNDETERMINED 10*3/uL (ref 150–400)
RBC: 3.83 MIL/uL — ABNORMAL LOW (ref 3.87–5.11)
RDW: 18.3 % — ABNORMAL HIGH (ref 11.5–15.5)
WBC: 12.2 10*3/uL — ABNORMAL HIGH (ref 4.0–10.5)
nRBC: 0 % (ref 0.0–0.2)

## 2020-10-31 LAB — VITAMIN D 25 HYDROXY (VIT D DEFICIENCY, FRACTURES): Vit D, 25-Hydroxy: 59.05 ng/mL (ref 30–100)

## 2020-10-31 LAB — TYPE AND SCREEN
ABO/RH(D): B POS
Antibody Screen: NEGATIVE

## 2020-10-31 LAB — VITAMIN B12: Vitamin B-12: 1621 pg/mL — ABNORMAL HIGH (ref 180–914)

## 2020-10-31 SURGERY — FIXATION, FEMUR, NECK, PERCUTANEOUS, USING SCREW
Anesthesia: General | Site: Hip | Laterality: Left

## 2020-10-31 MED ORDER — OXYCODONE HCL 5 MG PO TABS
5.0000 mg | ORAL_TABLET | ORAL | Status: DC | PRN
  Administered 2020-11-04: 10 mg via ORAL
  Administered 2020-11-06: 5 mg via ORAL
  Administered 2020-11-06 – 2020-11-11 (×8): 10 mg via ORAL
  Filled 2020-10-31 (×3): qty 2
  Filled 2020-10-31: qty 1
  Filled 2020-10-31 (×6): qty 2

## 2020-10-31 MED ORDER — ROCURONIUM BROMIDE 100 MG/10ML IV SOLN
INTRAVENOUS | Status: DC | PRN
  Administered 2020-10-31: 50 mg via INTRAVENOUS

## 2020-10-31 MED ORDER — HALOPERIDOL LACTATE 5 MG/ML IJ SOLN
0.5000 mg | Freq: Once | INTRAMUSCULAR | Status: AC
  Administered 2020-10-31: 0.5 mg via INTRAVENOUS
  Filled 2020-10-31: qty 0.1

## 2020-10-31 MED ORDER — ONDANSETRON HCL 4 MG PO TABS: 4.0000 mg | ORAL_TABLET | Freq: Four times a day (QID) | ORAL | Status: DC | PRN

## 2020-10-31 MED ORDER — BUPIVACAINE HCL (PF) 0.5 % IJ SOLN
INTRAMUSCULAR | Status: DC | PRN
  Administered 2020-10-31: 10 mL

## 2020-10-31 MED ORDER — METOCLOPRAMIDE HCL 10 MG PO TABS: 5.0000 mg | ORAL_TABLET | Freq: Three times a day (TID) | ORAL | Status: DC | PRN

## 2020-10-31 MED ORDER — METHOCARBAMOL 500 MG PO TABS
500.0000 mg | ORAL_TABLET | Freq: Four times a day (QID) | ORAL | Status: DC | PRN
  Administered 2020-11-03 – 2020-11-05 (×2): 500 mg via ORAL
  Filled 2020-10-31 (×2): qty 1

## 2020-10-31 MED ORDER — ONDANSETRON HCL 4 MG/2ML IJ SOLN
INTRAMUSCULAR | Status: AC
  Filled 2020-10-31: qty 2

## 2020-10-31 MED ORDER — OXYCODONE HCL 5 MG PO TABS: 5.0000 mg | ORAL_TABLET | Freq: Once | ORAL | Status: DC | PRN

## 2020-10-31 MED ORDER — ENOXAPARIN SODIUM 40 MG/0.4ML ~~LOC~~ SOLN
40.0000 mg | SUBCUTANEOUS | Status: DC
  Filled 2020-10-31: qty 0.4

## 2020-10-31 MED ORDER — ACETAMINOPHEN 10 MG/ML IV SOLN: 1000.0000 mg | Freq: Once | INTRAVENOUS | Status: DC | PRN

## 2020-10-31 MED ORDER — METHOCARBAMOL 1000 MG/10ML IJ SOLN
500.0000 mg | Freq: Four times a day (QID) | INTRAVENOUS | Status: DC | PRN
  Filled 2020-10-31: qty 5

## 2020-10-31 MED ORDER — BUPIVACAINE LIPOSOME 1.3 % IJ SUSP
INTRAMUSCULAR | Status: DC | PRN
  Administered 2020-10-31: 20 mL

## 2020-10-31 MED ORDER — OXYCODONE HCL 5 MG PO TABS
2.5000 mg | ORAL_TABLET | ORAL | Status: DC | PRN
  Administered 2020-11-02 – 2020-11-10 (×4): 5 mg via ORAL
  Filled 2020-10-31 (×4): qty 1

## 2020-10-31 MED ORDER — SEVOFLURANE IN SOLN
RESPIRATORY_TRACT | Status: AC
  Filled 2020-10-31: qty 500

## 2020-10-31 MED ORDER — FENTANYL CITRATE (PF) 100 MCG/2ML IJ SOLN
INTRAMUSCULAR | Status: AC
  Administered 2020-10-31: 25 ug via INTRAVENOUS
  Filled 2020-10-31: qty 2

## 2020-10-31 MED ORDER — KETOROLAC TROMETHAMINE 15 MG/ML IJ SOLN
7.5000 mg | Freq: Four times a day (QID) | INTRAMUSCULAR | Status: AC
  Administered 2020-10-31 – 2020-11-01 (×3): 7.5 mg via INTRAVENOUS
  Filled 2020-10-31 (×3): qty 1

## 2020-10-31 MED ORDER — FENTANYL CITRATE (PF) 100 MCG/2ML IJ SOLN: 25.0000 ug | INTRAMUSCULAR | Status: DC | PRN

## 2020-10-31 MED ORDER — PROPOFOL 10 MG/ML IV BOLUS
INTRAVENOUS | Status: DC | PRN
  Administered 2020-10-31: 120 mg via INTRAVENOUS

## 2020-10-31 MED ORDER — SODIUM CHLORIDE 0.9 % IV SOLN: INTRAVENOUS | Status: DC | PRN

## 2020-10-31 MED ORDER — SODIUM CHLORIDE 0.9 % IR SOLN
Status: DC | PRN
  Administered 2020-10-31: 500 mL

## 2020-10-31 MED ORDER — LIDOCAINE HCL (CARDIAC) PF 100 MG/5ML IV SOSY
PREFILLED_SYRINGE | INTRAVENOUS | Status: DC | PRN
  Administered 2020-10-31: 100 mg via INTRAVENOUS

## 2020-10-31 MED ORDER — OXYCODONE HCL 5 MG/5ML PO SOLN: 5.0000 mg | Freq: Once | ORAL | Status: DC | PRN

## 2020-10-31 MED ORDER — LACTATED RINGERS IV SOLN: INTRAVENOUS | Status: DC

## 2020-10-31 MED ORDER — ONDANSETRON HCL 4 MG/2ML IJ SOLN: 4.0000 mg | Freq: Once | INTRAMUSCULAR | Status: DC | PRN

## 2020-10-31 MED ORDER — FENTANYL CITRATE (PF) 100 MCG/2ML IJ SOLN
INTRAMUSCULAR | Status: DC | PRN
  Administered 2020-10-31: 25 ug via INTRAVENOUS
  Administered 2020-10-31: 50 ug via INTRAVENOUS
  Administered 2020-10-31: 25 ug via INTRAVENOUS

## 2020-10-31 MED ORDER — ACETAMINOPHEN 500 MG PO TABS
1000.0000 mg | ORAL_TABLET | Freq: Three times a day (TID) | ORAL | Status: DC
  Administered 2020-10-31 – 2020-11-12 (×33): 1000 mg via ORAL
  Filled 2020-10-31 (×34): qty 2

## 2020-10-31 MED ORDER — DEXAMETHASONE SODIUM PHOSPHATE 10 MG/ML IJ SOLN
INTRAMUSCULAR | Status: AC
  Filled 2020-10-31: qty 1

## 2020-10-31 MED ORDER — PHENYLEPHRINE HCL (PRESSORS) 10 MG/ML IV SOLN
INTRAVENOUS | Status: DC | PRN
  Administered 2020-10-31 (×4): 100 ug via INTRAVENOUS

## 2020-10-31 MED ORDER — ROCURONIUM BROMIDE 10 MG/ML (PF) SYRINGE
PREFILLED_SYRINGE | INTRAVENOUS | Status: AC
  Filled 2020-10-31: qty 10

## 2020-10-31 MED ORDER — TRAMADOL HCL 50 MG PO TABS
50.0000 mg | ORAL_TABLET | Freq: Four times a day (QID) | ORAL | Status: DC | PRN
  Administered 2020-11-01 – 2020-11-11 (×8): 50 mg via ORAL
  Filled 2020-10-31 (×9): qty 1

## 2020-10-31 MED ORDER — CITALOPRAM HYDROBROMIDE 20 MG PO TABS
20.0000 mg | ORAL_TABLET | ORAL | Status: DC
  Administered 2020-11-01 – 2020-11-12 (×12): 20 mg via ORAL
  Filled 2020-10-31 (×14): qty 1

## 2020-10-31 MED ORDER — DEXAMETHASONE SODIUM PHOSPHATE 10 MG/ML IJ SOLN
INTRAMUSCULAR | Status: DC | PRN
  Administered 2020-10-31: 10 mg via INTRAVENOUS

## 2020-10-31 MED ORDER — LIDOCAINE HCL (PF) 2 % IJ SOLN
INTRAMUSCULAR | Status: AC
  Filled 2020-10-31: qty 5

## 2020-10-31 MED ORDER — FLEET ENEMA 7-19 GM/118ML RE ENEM: 1.0000 | ENEMA | Freq: Once | RECTAL | Status: DC | PRN

## 2020-10-31 MED ORDER — ONDANSETRON HCL 4 MG/2ML IJ SOLN
INTRAMUSCULAR | Status: DC | PRN
  Administered 2020-10-31: 4 mg via INTRAVENOUS

## 2020-10-31 MED ORDER — HALOPERIDOL LACTATE 5 MG/ML IJ SOLN
INTRAMUSCULAR | Status: AC
  Administered 2020-10-31: 0.5 mg via INTRAVENOUS
  Filled 2020-10-31: qty 1

## 2020-10-31 MED ORDER — METOCLOPRAMIDE HCL 5 MG/ML IJ SOLN: 5.0000 mg | Freq: Three times a day (TID) | INTRAMUSCULAR | Status: DC | PRN

## 2020-10-31 MED ORDER — BISACODYL 10 MG RE SUPP
10.0000 mg | Freq: Every day | RECTAL | Status: DC | PRN
Start: 2020-10-31 — End: 2020-11-12

## 2020-10-31 MED ORDER — HYDROMORPHONE HCL 1 MG/ML IJ SOLN: 0.2500 mg | INTRAMUSCULAR | Status: DC | PRN

## 2020-10-31 MED ORDER — ACETAMINOPHEN 10 MG/ML IV SOLN
INTRAVENOUS | Status: DC | PRN
  Administered 2020-10-31: 1000 mg via INTRAVENOUS

## 2020-10-31 MED ORDER — CEFAZOLIN SODIUM-DEXTROSE 2-4 GM/100ML-% IV SOLN
INTRAVENOUS | Status: AC
  Filled 2020-10-31: qty 100

## 2020-10-31 MED ORDER — FENTANYL CITRATE (PF) 100 MCG/2ML IJ SOLN
INTRAMUSCULAR | Status: AC
  Filled 2020-10-31: qty 2

## 2020-10-31 MED ORDER — SODIUM CHLORIDE 0.9 % IV SOLN: INTRAVENOUS | Status: DC

## 2020-10-31 MED ORDER — DOCUSATE SODIUM 100 MG PO CAPS
100.0000 mg | ORAL_CAPSULE | Freq: Two times a day (BID) | ORAL | Status: DC
  Administered 2020-10-31 – 2020-11-12 (×23): 100 mg via ORAL
  Filled 2020-10-31 (×23): qty 1

## 2020-10-31 MED ORDER — ONDANSETRON HCL 4 MG/2ML IJ SOLN
4.0000 mg | Freq: Four times a day (QID) | INTRAMUSCULAR | Status: DC | PRN
  Administered 2020-11-03 – 2020-11-11 (×3): 4 mg via INTRAVENOUS
  Filled 2020-10-31 (×3): qty 2

## 2020-10-31 MED ORDER — SUGAMMADEX SODIUM 200 MG/2ML IV SOLN
INTRAVENOUS | Status: DC | PRN
  Administered 2020-10-31: 150 mg via INTRAVENOUS

## 2020-10-31 MED ORDER — SENNOSIDES-DOCUSATE SODIUM 8.6-50 MG PO TABS: 1.0000 | ORAL_TABLET | Freq: Every evening | ORAL | Status: DC | PRN

## 2020-10-31 MED ORDER — ACETAMINOPHEN 10 MG/ML IV SOLN
INTRAVENOUS | Status: AC
  Filled 2020-10-31: qty 100

## 2020-10-31 MED ORDER — PROPOFOL 500 MG/50ML IV EMUL
INTRAVENOUS | Status: AC
  Filled 2020-10-31: qty 50

## 2020-10-31 MED ORDER — HALOPERIDOL LACTATE 5 MG/ML IJ SOLN
0.5000 mg | Freq: Once | INTRAMUSCULAR | Status: AC
  Filled 2020-10-31: qty 0.1

## 2020-10-31 MED ORDER — CEFAZOLIN SODIUM-DEXTROSE 2-4 GM/100ML-% IV SOLN
2.0000 g | Freq: Four times a day (QID) | INTRAVENOUS | Status: AC
  Administered 2020-10-31 – 2020-11-01 (×3): 2 g via INTRAVENOUS
  Filled 2020-10-31 (×3): qty 100

## 2020-10-31 SURGICAL SUPPLY — 50 items
"PENCIL ELECTRO HAND CTR " (MISCELLANEOUS) ×1 IMPLANT
APL PRP STRL LF DISP 70% ISPRP (MISCELLANEOUS) ×1
BIT DRILL 4.9 CANNULATED (BIT) ×1
BIT DRILL CANN QC 4.9 LRG (BIT) IMPLANT
BLADE SURG 15 STRL LF DISP TIS (BLADE) ×1 IMPLANT
BLADE SURG 15 STRL SS (BLADE) ×2
CANISTER SUCT 1200ML W/VALVE (MISCELLANEOUS) ×2 IMPLANT
CHLORAPREP W/TINT 26 (MISCELLANEOUS) ×2 IMPLANT
COVER WAND RF STERILE (DRAPES) ×2 IMPLANT
DRAPE 3/4 80X56 (DRAPES) ×2 IMPLANT
DRAPE SURG 17X11 SM STRL (DRAPES) ×4 IMPLANT
DRAPE U-SHAPE 47X51 STRL (DRAPES) ×4 IMPLANT
DRILL BIT CANNULATED 4.9 (BIT) ×2
DRSG OPSITE POSTOP 3X4 (GAUZE/BANDAGES/DRESSINGS) ×2 IMPLANT
DRSG OPSITE POSTOP 4X6 (GAUZE/BANDAGES/DRESSINGS) ×1 IMPLANT
ELECT REM PT RETURN 9FT ADLT (ELECTROSURGICAL) ×2
ELECTRODE REM PT RTRN 9FT ADLT (ELECTROSURGICAL) ×1 IMPLANT
GAUZE XEROFORM 1X8 LF (GAUZE/BANDAGES/DRESSINGS) ×2 IMPLANT
GLOVE SRG 8 PF TXTR STRL LF DI (GLOVE) ×1 IMPLANT
GLOVE SURG SYN 7.5  E (GLOVE) ×4
GLOVE SURG SYN 7.5 E (GLOVE) ×2 IMPLANT
GLOVE SURG SYN 7.5 PF PI (GLOVE) ×2 IMPLANT
GLOVE SURG UNDER POLY LF SZ8 (GLOVE) ×2
GOWN STRL REUS W/ TWL LRG LVL3 (GOWN DISPOSABLE) ×1 IMPLANT
GOWN STRL REUS W/ TWL XL LVL3 (GOWN DISPOSABLE) ×1 IMPLANT
GOWN STRL REUS W/TWL LRG LVL3 (GOWN DISPOSABLE) ×2
GOWN STRL REUS W/TWL XL LVL3 (GOWN DISPOSABLE) ×2
GUIDEWIRE ASNIS 3.2 NONCAL (WIRE) ×3 IMPLANT
KIT TURNOVER CYSTO (KITS) ×2 IMPLANT
MANIFOLD NEPTUNE II (INSTRUMENTS) ×2 IMPLANT
MAT ABSORB  FLUID 56X50 GRAY (MISCELLANEOUS) ×4
MAT ABSORB FLUID 56X50 GRAY (MISCELLANEOUS) ×2 IMPLANT
NDL FILTER BLUNT 18X1 1/2 (NEEDLE) ×1 IMPLANT
NEEDLE FILTER BLUNT 18X 1/2SAF (NEEDLE) ×1
NEEDLE FILTER BLUNT 18X1 1/2 (NEEDLE) ×1 IMPLANT
NEEDLE HYPO 22GX1.5 SAFETY (NEEDLE) ×2 IMPLANT
NS IRRIG 500ML POUR BTL (IV SOLUTION) ×2 IMPLANT
PACK HIP COMPR (MISCELLANEOUS) ×2 IMPLANT
PENCIL ELECTRO HAND CTR (MISCELLANEOUS) ×2 IMPLANT
SCREW ASNIS 85MM (Screw) ×2 IMPLANT
SCREW CANN 6.5X80 STRL (Screw) ×2 IMPLANT
SCREW CANN P/T ASNIS 6.5X75 (Screw) ×1 IMPLANT
STAPLER SKIN PROX 35W (STAPLE) ×2 IMPLANT
SUT VIC AB 0 CT1 36 (SUTURE) ×2 IMPLANT
SUT VIC AB 2-0 CT1 27 (SUTURE) ×2
SUT VIC AB 2-0 CT1 TAPERPNT 27 (SUTURE) ×1 IMPLANT
SYR 30ML LL (SYRINGE) ×2 IMPLANT
SYR 5ML LL (SYRINGE) ×2 IMPLANT
TAPE CLOTH 3X10 WHT NS LF (GAUZE/BANDAGES/DRESSINGS) ×2 IMPLANT
WASHER SCREW MATTA SS 13.0X1.5 (Washer) ×3 IMPLANT

## 2020-10-31 NOTE — Anesthesia Procedure Notes (Signed)
Procedure Name: Intubation Date/Time: 10/31/2020 4:31 PM Performed by: Aline Brochure, CRNA Pre-anesthesia Checklist: Patient identified, Emergency Drugs available, Suction available and Patient being monitored Patient Re-evaluated:Patient Re-evaluated prior to induction Oxygen Delivery Method: Circle system utilized Preoxygenation: Pre-oxygenation with 100% oxygen Induction Type: IV induction Ventilation: Mask ventilation without difficulty Laryngoscope Size: McGraph and 3 Grade View: Grade I Tube type: Oral Tube size: 7.0 mm Number of attempts: 1 Airway Equipment and Method: Stylet and Video-laryngoscopy Placement Confirmation: ETT inserted through vocal cords under direct vision,  positive ETCO2 and breath sounds checked- equal and bilateral Secured at: 20 cm Tube secured with: Tape Dental Injury: Teeth and Oropharynx as per pre-operative assessment

## 2020-10-31 NOTE — Consult Note (Signed)
[place]  Telephone:([phone]) [phone] Fax:(336) [phone]  ID: [name] OB: [birth year]  MR#: [id number]  KZS#:[id number]  Patient Care Team: Idelle Crouch, MD as PCP - General (Internal Medicine) Lloyd Huger, MD as Consulting Physician (Oncology)  CHIEF COMPLAINT: Chronic ITP, left femoral neck fracture.  INTERVAL HISTORY: Patient is an 87 year old female with chronic ITP and dementia who recently had a fall sustaining a left femoral neck fracture.  Upon evaluation her platelet count was found to be 32 and was treated with Nplate in the emergency room yesterday.  Patient remains mildly confused, but appears at her baseline.  She has leg pain along with increased weakness and fatigue, but offers no further complaints.  REVIEW OF SYSTEMS:   Review of Systems  Constitutional: Positive for malaise/fatigue. Negative for fever and weight loss.  Respiratory: Negative for cough and shortness of breath.   Cardiovascular: Negative.  Negative for chest pain and leg swelling.  Gastrointestinal: Negative.  Negative for abdominal pain.  Genitourinary: Negative.  Negative for dysuria.  Musculoskeletal: Positive for falls and joint pain.  Skin: Negative.   Neurological: Positive for weakness. Negative for dizziness, focal weakness and headaches.  Psychiatric/Behavioral: Positive for memory loss.    PAST MEDICAL HISTORY: Past Medical History:  Diagnosis Date  . Alzheimer disease ([place])   . Anemia   . Anxiety   . Asthma    WELL CONTROLLED  . Chronic back pain   . Depression   . Depression   . GERD (gastroesophageal reflux disease)   . Hiatal hernia   . Hypercholesteremia   . Hypothyroidism   . ITP (idiopathic thrombocytopenic purpura)    FOLLOWED BR DR Grayland Ormond  . LBBB (left bundle branch block)   . Osteoarthritis   . Osteoporosis   . Restless legs     PAST SURGICAL HISTORY: Past Surgical History:  Procedure Laterality Date  . CARPAL  TUNNEL RELEASE    . ESOPHAGOGASTRODUODENOSCOPY (EGD) WITH PROPOFOL N/A 09/10/2015   Procedure: ESOPHAGOGASTRODUODENOSCOPY (EGD) WITH PROPOFOL;  Surgeon: Lollie Sails, MD;  Location: [facility] ENDOSCOPY;  Service: Endoscopy;  Laterality: N/A;  . IRRIGATION AND DEBRIDEMENT HEMATOMA Right 07/13/2018   Procedure: IRRIGATION AND DEBRIDEMENT HEMATOMA-RIGHT SHIN;  Surgeon: Herbert Pun, MD;  Location: [facility] ORS;  Service: General;  Laterality: Right;  . KYPHOPLASTY N/A 05/02/2019   Procedure: L1 KYPHOPLASTY;  Surgeon: Hessie Knows, MD;  Location: [facility] ORS;  Service: Orthopedics;  Laterality: N/A;  . LUMBAR [place]    . REVERSE SHOULDER ARTHROPLASTY Left 06/25/2020   Procedure: REVERSE SHOULDER ARTHROPLASTY;  Surgeon: Corky Mull, MD;  Location: [facility] ORS;  Service: Orthopedics;  Laterality: Left;  . SPLENECTOMY, PARTIAL    . TOTAL ABDOMINAL HYSTERECTOMY      FAMILY HISTORY: Family History  Problem Relation Age of Onset  . Stroke Mother     ADVANCED DIRECTIVES (Y/N):  @ADVDIR @  HEALTH MAINTENANCE: Social History   Tobacco Use  . Smoking status: Never Smoker  . Smokeless tobacco: Never Used  Vaping Use  . Vaping Use: Never used  Substance Use Topics  . Alcohol use: No    Alcohol/week: 0.0 standard drinks  . Drug use: No     Colonoscopy:  PAP:  Bone density:  Lipid panel:  Allergies  Allergen Reactions  . Aspirin     Upset stomach  . Diazepam Itching    sneezing  . Other     seasonal allergies  . Tetanus Toxoids     Localized superficial swelling  of skin  . Morphine Itching and Rash    Current Facility-Administered Medications  Medication Dose Route Frequency Provider Last Rate Last Admin  . acetaminophen (OFIRMEV) IV 1,000 mg  1,000 mg Intravenous Once PRN Arita Miss, MD      . Doug Sou Hold] acetaminophen (TYLENOL) tablet 650 mg  650 mg Oral Q6H PRN Agbata, Tochukwu, MD      . Doug Sou Hold] albuterol (VENTOLIN HFA) 108 (90 Base) MCG/ACT inhaler 2 puff  2 puff  Inhalation Q6H PRN Agbata, Tochukwu, MD      . Doug Sou Hold] ALPRAZolam Duanne Moron) tablet 0.5 mg  0.5 mg Oral TID PRN Agbata, Tochukwu, MD   0.5 mg at 10/30/20 1529  . [MAR Hold] ascorbic acid (VITAMIN C) tablet 500 mg  500 mg Oral Daily Agbata, Tochukwu, MD      . Doug Sou Hold] benzonatate (TESSALON) capsule 200 mg  200 mg Oral TID Agbata, Tochukwu, MD   200 mg at 10/30/20 2350  . [MAR Hold] buPROPion (WELLBUTRIN XL) 24 hr tablet 150 mg  150 mg Oral Daily Agbata, Tochukwu, MD      . Doug Sou Hold] busPIRone (BUSPAR) tablet 15 mg  15 mg Oral BID Agbata, Tochukwu, MD   15 mg at 10/30/20 2350  . [MAR Hold] calcium-vitamin D (OSCAL WITH D) 500-200 MG-UNIT per tablet 1 tablet  1 tablet Oral Daily Agbata, Tochukwu, MD      . Doug Sou Hold] carboxymethylcellul-glycerin (REFRESH OPTIVE) 0.5-0.9 % ophthalmic solution 1 drop  1 drop Both Eyes QID PRN Agbata, Tochukwu, MD      . Doug Sou Hold] cholecalciferol (VITAMIN D3) tablet 2,000 Units  2,000 Units Oral Daily Agbata, Tochukwu, MD      . Derrill Memo ON 11/01/2020] citalopram (CELEXA) tablet 20 mg  20 mg Oral Dayle Points, Dileep, MD      . Doug Sou Hold] donepezil (ARICEPT) tablet 10 mg  10 mg Oral BID Agbata, Tochukwu, MD   10 mg at 10/30/20 2350  . [MAR Hold] feeding supplement (ENSURE ENLIVE / ENSURE PLUS) liquid 237 mL  237 mL Oral BID BM Agbata, Tochukwu, MD      . fentaNYL (SUBLIMAZE) injection 25-50 mcg  25-50 mcg Intravenous Q5 min PRN Arita Miss, MD      . Doug Sou Hold] fentaNYL (SUBLIMAZE) injection 50 mcg  50 mcg Intravenous Q2H PRN Naaman Plummer, MD   50 mcg at 10/31/20 0248  . [MAR Hold] fluticasone (FLONASE) 50 MCG/ACT nasal spray 2 spray  2 spray Each Nare Daily PRN Agbata, Tochukwu, MD      . Doug Sou Hold] gabapentin (NEURONTIN) capsule 300 mg  300 mg Oral QID Agbata, Tochukwu, MD   300 mg at 10/30/20 2350  . [MAR Hold] HYDROcodone-acetaminophen (NORCO/VICODIN) 5-325 MG per tablet 1-2 tablet  1-2 tablet Oral Q6H PRN Agbata, Tochukwu, MD   2 tablet at 10/30/20 2352  .  lactated ringers infusion   Intravenous Continuous Arita Miss, MD   Stopped at 10/31/20 1633  . [MAR Hold] lamoTRIgine (LAMICTAL) tablet 50 mg  50 mg Oral QHS Agbata, Tochukwu, MD   50 mg at 10/30/20 2352  . [MAR Hold] levothyroxine (SYNTHROID) tablet 50 mcg  50 mcg Oral QAC breakfast Agbata, Tochukwu, MD      . Doug Sou Hold] memantine (NAMENDA) tablet 10 mg  10 mg Oral BID Agbata, Tochukwu, MD   10 mg at 10/30/20 2352  . [MAR Hold] methocarbamol (ROBAXIN) tablet 500 mg  500 mg Oral Q6H PRN Collier Bullock, MD  Or  . [MAR Hold] methocarbamol (ROBAXIN) 500 mg in dextrose 5 % 50 mL IVPB  500 mg Intravenous Q6H PRN Agbata, Tochukwu, MD      . Doug Sou Hold] mometasone-formoterol (DULERA) 200-5 MCG/ACT inhaler 2 puff  2 puff Inhalation BID Agbata, Tochukwu, MD   2 puff at 10/31/20 1012  . [MAR Hold] montelukast (SINGULAIR) tablet 10 mg  10 mg Oral QHS Agbata, Tochukwu, MD   10 mg at 10/30/20 2351  . [MAR Hold] morphine 2 MG/ML injection 0.5 mg  0.5 mg Intravenous Q2H PRN Agbata, Tochukwu, MD      . Doug Sou Hold] multivitamin-lutein (OCUVITE-LUTEIN) capsule 1 capsule  1 capsule Oral BID Agbata, Tochukwu, MD      . ondansetron (ZOFRAN) injection 4 mg  4 mg Intravenous Once PRN Arita Miss, MD      . Doug Sou Hold] oxybutynin (DITROPAN) tablet 5 mg  5 mg Oral TID Agbata, Tochukwu, MD   5 mg at 10/30/20 1530  . oxyCODONE (Oxy IR/ROXICODONE) immediate release tablet 5 mg  5 mg Oral Once PRN Arita Miss, MD       Or  . oxyCODONE (ROXICODONE) 5 MG/5ML solution 5 mg  5 mg Oral Once PRN Arita Miss, MD      . Doug Sou Hold] pantoprazole (PROTONIX) EC tablet 20 mg  20 mg Oral BID Agbata, Tochukwu, MD   20 mg at 10/31/20 1013  . [MAR Hold] pravastatin (PRAVACHOL) tablet 20 mg  20 mg Oral QHS Agbata, Tochukwu, MD   20 mg at 10/30/20 2351  . [MAR Hold] senna (SENOKOT) tablet 8.6 mg  1 tablet Oral BID Agbata, Tochukwu, MD   8.6 mg at 10/30/20 2353  . [MAR Hold] vitamin B-12 (CYANOCOBALAMIN) tablet 1,000 mcg  1,000 mcg Oral  Daily Agbata, Tochukwu, MD      . Doug Sou Hold] vitamin E capsule 400 Units  400 Units Oral Daily Agbata, Tochukwu, MD       Facility-Administered Medications Ordered in Other Encounters  Medication Dose Route Frequency Provider Last Rate Last Admin  . 0.9 %  sodium chloride infusion   Intravenous Continuous PRN Aline Brochure, CRNA   Stopped at 10/31/20 1725  . acetaminophen (OFIRMEV) IV   Intravenous Anesthesia Intra-op Aline Brochure, CRNA   1,000 mg at 10/31/20 1719  . dexamethasone (DECADRON) injection   Intravenous Anesthesia Intra-op Aline Brochure, CRNA   10 mg at 10/31/20 1703  . fentaNYL (SUBLIMAZE) injection   Intravenous Anesthesia Intra-op Aline Brochure, CRNA   25 mcg at 10/31/20 1740  . lidocaine (cardiac) 100 mg/10mL (XYLOCAINE) injection 2%   Intravenous Anesthesia Intra-op Aline Brochure, CRNA   100 mg at 10/31/20 1629  . ondansetron (ZOFRAN) injection   Intravenous Anesthesia Intra-op Aline Brochure, CRNA   4 mg at 10/31/20 1703  . phenylephrine (NEO-SYNEPHRINE) injection   Intravenous Anesthesia Intra-op Aline Brochure, CRNA   100 mcg at 10/31/20 1722  . propofol (DIPRIVAN) 10 mg/mL bolus/IV push   Intravenous Anesthesia Intra-op Aline Brochure, CRNA   120 mg at 10/31/20 1629  . rocuronium (ZEMURON) injection   Intravenous Anesthesia Intra-op Aline Brochure, CRNA   50 mg at 10/31/20 1629  . sugammadex sodium (BRIDION) injection   Intravenous Anesthesia Intra-op Aline Brochure, CRNA   150 mg at 10/31/20 1732    OBJECTIVE: Vitals:   10/31/20 1438 10/31/20 1446  BP: (!) 145/82   Pulse: 77   Resp: 16   Temp:  98.9 F (37.2 C)  SpO2: 100%      Body  mass index is 27.06 kg/m.    ECOG FS:4 - Bedbound  General: Well-developed, well-nourished, no acute distress. Eyes: Pink conjunctiva, anicteric sclera. HEENT: Normocephalic, moist mucous membranes. Lungs: No audible wheezing or coughing. Heart: Regular rate and rhythm. Abdomen: Soft, nontender, no  obvious distention. Musculoskeletal: No edema, cyanosis, or clubbing. Neuro: Alert, mildly confused. Cranial nerves grossly intact. Skin: No rashes or petechiae noted. Psych: Normal affect.   LAB RESULTS:  Lab Results  Component Value Date   NA 143 10/31/2020   K 4.1 10/31/2020   CL 107 10/31/2020   CO2 30 10/31/2020   GLUCOSE 84 10/31/2020   BUN 17 10/31/2020   CREATININE 0.83 10/31/2020   CALCIUM 9.0 10/31/2020   PROT 6.1 (L) 10/30/2020   ALBUMIN 3.7 10/30/2020   AST 30 10/30/2020   ALT 14 10/30/2020   ALKPHOS 90 10/30/2020   BILITOT 0.5 10/30/2020   GFRNONAA >60 10/31/2020   GFRAA 52 (L) 06/19/2020    Lab Results  Component Value Date   WBC 13.2 (H) 10/31/2020   NEUTROABS 10.2 (H) 10/31/2020   HGB 11.3 (L) 10/31/2020   HCT 35.1 (L) 10/31/2020   MCV 86.9 10/31/2020   PLT 18 (LL) 10/31/2020     STUDIES: DG Chest 1 View  Result Date: 10/30/2020 CLINICAL DATA:  Fall. EXAM: CHEST  1 VIEW COMPARISON:  Chest x-ray dated August 18, 2020. FINDINGS: Unchanged mild cardiomegaly. Normal pulmonary vascularity. No focal consolidation, pleural effusion, or pneumothorax. No acute osseous abnormality. Prior left shoulder reverse total arthroplasty. IMPRESSION: 1. No active disease. Electronically Signed   By: Titus Dubin M.D.   On: 10/30/2020 09:28   CT PELVIS WO CONTRAST  Result Date: 10/30/2020 CLINICAL DATA:  Slipped on ice. EXAM: CT PELVIS WITHOUT CONTRAST TECHNIQUE: Multidetector CT imaging of the pelvis was performed following the standard protocol without intravenous contrast. COMPARISON:  None. FINDINGS: Urinary Tract:  Unremarkable Bowel: Multiple diverticula sigmoid colon without acute inflammation. Vascular/Lymphatic: No pelvic adenopathy. Reproductive:  Post hysterectomy.  Adnexa unremarkable Other: Musculoskeletal: Subcapital femoral neck fracture with mild impaction. Minimal angulation. The joint is maintained. No hematoma. There is bruising in the subcutaneous  tissue over the LEFT hip. No muscular hematoma. Posterior lumbar fusion IMPRESSION: LEFT femoral neck subcapital impaction fracture. Electronically Signed   By: Suzy Bouchard M.D.   On: 10/30/2020 10:06   DG Hip Unilat With Pelvis 2-3 Views Left  Result Date: 10/30/2020 CLINICAL DATA:  Fall on ice with left hip pain EXAM: DG HIP (WITH OR WITHOUT PELVIS) 2-3V LEFT COMPARISON:  None. FINDINGS: There is cortical irregularity and slight angulation in the subcapital left femoral neck suspicious for nondisplaced subcapital left femoral neck fracture. No additional potential fracture. No left hip dislocation. No focal osseous lesions. Partially visualized bilateral posterior spinal fusion hardware in the lower lumbar spine. No significant degenerative changes in the hip joints. IMPRESSION: Cortical irregularity and slight angulation in the subcapital left femoral neck suspicious for nondisplaced subcapital left femoral neck fracture. No left hip dislocation. Consider left hip CT for further evaluation. Electronically Signed   By: Ilona Sorrel M.D.   On: 10/30/2020 09:28    ASSESSMENT: Chronic ITP, left femoral neck fracture.  PLAN:    1.  Chronic ITP: Patient's platelet count was found to be 32 in the emergency room and despite treatment of 10 mcg/kg of Nplate her platelet count dropped to 18 today.  Typically Nplate takes anywhere from 2 to 7 days to take effect.  Patient received 2 units of  platelets today for her surgery.  No reported incidence of excess bleeding during surgery.  No further intervention is needed.  Continue to monitor daily CBC. 2.  Left femoral neck fracture: Appreciate orthopedic input.  Patient underwent surgery today.  Have also consulted palliative care for further evaluation.   Appreciate consult, will follow.   Lloyd Huger, MD   10/31/2020 5:47 PM

## 2020-10-31 NOTE — Anesthesia Postprocedure Evaluation (Signed)
Anesthesia Post Note  Patient: Ariana [name]  Procedure(s) Performed: CANNULATED HIP PINNING (Left Hip)  Patient location during evaluation: PACU Anesthesia Type: General Level of consciousness: awake and alert Pain management: pain level controlled Vital Signs Assessment: post-procedure vital signs reviewed and stable Respiratory status: spontaneous breathing and respiratory function stable Cardiovascular status: stable Anesthetic complications: no   No complications documented.   Last Vitals:  Vitals:   10/31/20 1850 10/31/20 1923  BP: (!) 136/56 (!) 148/55  Pulse: 79 78  Resp: 16 16  Temp: 36.7 C 36.4 C  SpO2: 100% 94%    Last Pain:  Vitals:   10/31/20 1850  TempSrc:   PainSc: 3                  KEPHART,WILLIAM K

## 2020-10-31 NOTE — Op Note (Signed)
DATE OF SURGERY: 10/31/2020  PREOPERATIVE DIAGNOSIS: Left valgus impacted femoral neck fracture  POSTOPERATIVE DIAGNOSIS: Left valgus impacted femoral neck fracture  PROCEDURE: Percutaneous pinning of Left femoral neck fracture  SURGEON: Cato Mulligan, MD  ASSISTANTS: none  EBL: 25 cc  COMPONENTS:  Stryker 6.17mm cannulated screws x 3 with washers (55mm, 63mm, 69mm)   INDICATIONS: Ariana [name] is a 87 y.o. female who sustained a valgus impacted femoral neck fracture after a fall. Risks and benefits of percutaneous pinning were explained to the patient and family. Risks include but are not limited to bleeding, infection, injury to tissues, nerves, vessels, DVT/PE, malunion/nonunion, hardware failure, and risks of anesthesia. The patient and family understand these risks, have completed an informed consent, and wish to proceed.   PROCEDURE:  The patient was brought into the operating room. After administering spinal anesthesia, the patient was placed in the supine position on the Hana table. The uninjured leg was extended while the injured lower extremity was placed in a neutral position with care taken to not displace the fracture during positioning. The lateral aspects of the operative hip and thigh were prepped with ChloraPrep solution before being draped sterilely. IV antibiotics were administered. A timeout was performed to verify the appropriate surgical site, patient, and procedure.   The greater trochanter was identified and an approximately 5 cm incision was made over the lateral aspect of the proximal femur. The incision was carried down through the subcutaneous tissues to expose the IT band. This was split at the proximal portion of the incision and the vastus lateralis was split in line with its fibers. The lateral aspect of the femur was cleared of soft tissue. Under fluoroscopic guidance, a guidewire was placed along the inferior aspect of the femoral neck into the  head while ensuring the start point on the lateral cortex was not below the level of the lesser trochanter. A parallel guide was used to place two additional guidepins (superior posterior and superior anterior). Position of all pins was verified fluoroscopically in both the AP and lateral views. A measuring device was used the measure appropriate screw length. The guidepins were drilled with a 3.63mm drill. Appropriately sized screws were advanced starting with the inferior screw, then superior posterior, then superior anterior. Screws were sequentially tightened. Hardware position and bony alignment was confirmed fluoroscopically with AP and lateral views.   The wounds were irrigated thoroughly with sterile saline solution. The IT band was closed with 0-Vicryl. Deep fat sutures were also placed with 0-Vicryl. The subcutaneous tissues were closed using 2-0 Vicryl interrupted sutures. The skin was closed using staples. Sterile occlusive dressing was applied. The patient was then transferred to the recovery room in satisfactory condition after tolerating the procedure well.  POSTOPERATIVE PLAN: The patient will be WBAT on the operative extremity. DVT ppx x 4 weeks to start on POD#1. Ancef x 24 hours. PT/OT on POD#1.

## 2020-10-31 NOTE — Progress Notes (Signed)
[facility] Progress Note  Patient: Ariana [name]    WSF:[id number]  DOA: 10/30/2020     Date of Service: the patient was seen and examined on 10/31/2020  Chief Complaint  Patient presents with  . Fall   Brief hospital course: Ariana [name] is a 87 y.o. female with medical history significant for ITP, Alzheimer's dementia, depression, hypothyroidism, anxiety disorder and asthma who arrives to the ER via EMS for evaluation of left hip pain following a fall.  Patient was walking her dog and slipped on ice, landing on her left side.  She was unable to get up or bear weight on her left side and so EMS was called.  Patient with complaints of left shoulder, left hip and pain in the left side of the face. She denies any loss of consciousness or headache.  She denies having any chest pain, no shortness of breath, no nausea, no vomiting, no diarrhea, no abdominal pain, no urinary frequency, nocturia or dysuria.  No cough, no fever, no chills, no dizziness or lightheadedness. Labs show sodium 140, potassium 3.7, chloride 105, bicarb 25, glucose 109, BUN 20, creatinine 0.92, calcium 9.3, alk phos 90, albumin 3.7, AST 30, ALT 14, total protein 6.1, troponin 10, white count 13.5, hemoglobin 10.7, hematocrit 34.7, MCV 89, RDW 17, platelet count 32,, PT 13.3, INR 1.1 Respiratory viral panel is negative Chest x-ray reviewed by me shows no acute findings X-ray of the left hip shows cortical irregularity and slight angulation in the subcapital left femoral neck suspicious for nondisplaced subcapital left femoral neck fracture. No left hip dislocation. Consider left hip CT for further evaluation. CT scan of the pelvis without contrast shows LEFT femoral neck subcapital impaction fracture. Twelve-lead EKG reviewed by me shows sinus rhythm with PACs, left bundle branch block and left axis deviation.   ED Course: Patient is an 87 year old female with a history of ITP, dementia and  depression who presents to the ER for evaluation following a fall.  She has a left femoral neck fracture and will be admitted to the hospital for further evaluation.  Assessment and Plan: Principal Problem:   Closed displaced fracture of left femoral neck (HCC) Active Problems:   Chronic ITP (idiopathic thrombocytopenic purpura) (HCC)   Acquired hypothyroidism   Depressive disorder   Benign essential hypertension   Late onset Alzheimer's disease without behavioral disturbance (HCC)   Idiopathic thrombocythemia (HCC)   Hiatal hernia   GERD (gastroesophageal reflux disease)       Closed displaced fracture of left femoral neck Status post mechanical fall Immobilize left lower extremity Pain control Place patient on fall precautions Orthopedic surgery plan for ORIF today Follow PT and OT eval after surgery   Idiopathic thrombocytopenic purpura Chronic Patient has a platelet count of 32,000 with no evidence of bleeding  hematology consult appreciated Patient received one dose of Nplate plts droppe to 18 K, 2 units of platelet transfusion will be given before and during surgery per oncology   Hypothyroidism Continue Synthroid   Alzheimer's dementia without behavioral disturbance Continue Aricept and Namenda   Depression and anxiety Continue as needed alprazolam Continue Wellbutrin and BuSpar    Hernia with GERD  Continue Protonix    Body mass index is 27.06 kg/m.  Nutrition Problem: Increased nutrient needs Etiology: post-op healing Interventions: Interventions: MVI,Ensure Enlive (each supplement provides 350kcal and 20 grams of protein)      Diet: Regular diet, n.p.o. since midnight for surgery DVT Prophylaxis: SCD, pharmacological prophylaxis contraindicated due  to Thrombocytopenia, risk of bleeding   Advance goals of care discussion: Full code  Family Communication: family was present at bedside, at the time of interview.  The pt  provided permission to discuss medical plan with the family. Opportunity was given to ask question and all questions were answered satisfactorily.   Disposition:  Pt is from Home, admitted with fall and left femur fracture, plan for surgery today, which precludes a safe discharge. Discharge to SNF, when he when cleared by orthopedics.  Subjective: No significant overnight events, patient was complaining of left hip pain at the site of fracture.  Denied any other complaints, no any bleeding or bruises. Patient denied any chest pain or palpitations, no shortness of breath.  Physical Exam: General:  alert oriented to time, place, and person.  Appear in mild distress, affect appropriate Eyes: PERRLA ENT: Oral Mucosa Clear, moist  Neck: no JVD,  Cardiovascular: S1 and S2 Present, no Murmur,  Respiratory: good respiratory effort, Bilateral Air entry equal and Decreased, no Crackles, no wheezes Abdomen: Bowel Sound present, Soft and no tenderness,  Skin: no rashes Extremities: no Pedal edema, no calf tenderness, bilateral chronic pigmentation.  Left hip tenderness, no bruises or hematoma noticed Neurologic: without any new focal findings Gait not checked due to patient safety concerns  Vitals:   10/31/20 1120 10/31/20 1136 10/31/20 1438 10/31/20 1446  BP: (!) 170/65  (!) 145/82   Pulse: 68  77   Resp: 16  16   Temp: (!) 97.2 F (36.2 C)   98.9 F (37.2 C)  TempSrc: Temporal   Temporal  SpO2: 100%  100%   Weight:  67.1 kg    Height:  $Remove'5\' 2"'FwPeaOG$  (1.575 m)      Intake/Output Summary (Last 24 hours) at 10/31/2020 1514 Last data filed at 10/31/2020 0334 Gross per 24 hour  Intake -  Output 0 ml  Net 0 ml   Filed Weights   10/30/20 0846 10/31/20 1136  Weight: 67.1 kg 67.1 kg    Data Reviewed: I have personally reviewed and interpreted daily labs, tele strips, imagings as discussed above. I reviewed all nursing notes, pharmacy notes, vitals, pertinent old records I have discussed plan  of care as described above with RN and patient/family.  CBC: Recent Labs  Lab 10/30/20 0850 10/31/20 0529 10/31/20 1057  WBC 13.5* 12.2* 13.2*  NEUTROABS  --   --  10.2*  HGB 10.7* 10.6* 11.3*  HCT 34.7* 33.7* 35.1*  MCV 89.0 88.0 86.9  PLT 32* PLATELET CLUMPS NOTED ON SMEAR, UNABLE TO ESTIMATE 18*   Basic Metabolic Panel: Recent Labs  Lab 10/30/20 0850 10/31/20 0529  NA 140 143  K 3.7 4.1  CL 105 107  CO2 25 30  GLUCOSE 109* 84  BUN 20 17  CREATININE 0.92 0.83  CALCIUM 9.3 9.0    Studies: No results found.  Scheduled Meds: . [MAR Hold] vitamin C  500 mg Oral Daily  . [MAR Hold] benzonatate  200 mg Oral TID  . [MAR Hold] buPROPion  150 mg Oral Daily  . [MAR Hold] busPIRone  15 mg Oral BID  . [MAR Hold] calcium-vitamin D  1 tablet Oral Daily  . [MAR Hold] cholecalciferol  2,000 Units Oral Daily  . [START ON 11/01/2020] citalopram  20 mg Oral BH-q7a  . [MAR Hold] donepezil  10 mg Oral BID  . [MAR Hold] feeding supplement  237 mL Oral BID BM  . [MAR Hold] gabapentin  300 mg Oral QID  . [  MAR Hold] lamoTRIgine  50 mg Oral QHS  . [MAR Hold] levothyroxine  50 mcg Oral QAC breakfast  . [MAR Hold] memantine  10 mg Oral BID  . [MAR Hold] mometasone-formoterol  2 puff Inhalation BID  . [MAR Hold] montelukast  10 mg Oral QHS  . [MAR Hold] multivitamin-lutein  1 capsule Oral BID  . [MAR Hold] oxybutynin  5 mg Oral TID  . [MAR Hold] pantoprazole  20 mg Oral BID  . [MAR Hold] pravastatin  20 mg Oral QHS  . [MAR Hold] senna  1 tablet Oral BID  . [MAR Hold] vitamin B-12  1,000 mcg Oral Daily  . [MAR Hold] vitamin E  400 Units Oral Daily   Continuous Infusions: . ceFAZolin    . [MAR Hold]  ceFAZolin (ANCEF) IV    . lactated ringers 10 mL/hr at 10/31/20 1200  . [MAR Hold] methocarbamol (ROBAXIN) IV     PRN Meds: [MAR Hold] acetaminophen, [MAR Hold] albuterol, [MAR Hold] ALPRAZolam, [MAR Hold] carboxymethylcellul-glycerin, [MAR Hold] fentaNYL (SUBLIMAZE) injection, [MAR Hold]  fluticasone, [MAR Hold] HYDROcodone-acetaminophen, [MAR Hold] methocarbamol **OR** [MAR Hold] methocarbamol (ROBAXIN) IV, [MAR Hold]  morphine injection  Time spent: 35 minutes  Author: Val Riles. MD [facility] 10/31/2020 3:14 PM  To reach On-call, see care teams to locate the attending and reach out to them via [email or web address]. If 7PM-7AM, please contact night-coverage If you still have difficulty reaching the attending provider, please page the [facility] (Director on Call) for [facility] on amion for assistance.

## 2020-10-31 NOTE — TOC Initial Note (Addendum)
Transition of Care [facility]) - Initial/Assessment Note    Patient Details  Name: Ariana [name] MRN: [id number] Date of Birth: 12-Sep-1933  [facility]) CM/SW Contact:    Ova Freshwater Phone Number: [phone] 10/31/2020, 9:03 AM  Clinical Narrative:                  CSW spoke with patient and Iliyah, Bui (Spouse)  [phone] [facility]) who is primary caretaker.  Patient was awake and alert but confused at times during the conversation.  Patient is very easy to redirect but does become mildly anxious or emotional when speaking.  Patient is aware she gets confused. Pratient oriented to self, place and spouse.  Ms. Warnke state patient is unable to perform ADLs w/out assistance and/or supervision.  Patient is incontinent. Mr. Lunsford organizes patient's medication everyday, but she is abel to take them herself. Mr. Hocevar takes patient to doctor's appointments, Dr. Doy Hutching is patient's PCP.  Ms. Ernster would prefer home health, he does not think the patient would do well in a SNF.  Expected Discharge Plan: Skilled Nursing Facility Barriers to Discharge: Continued Medical Work up   Patient Goals and CMS Choice Patient states their goals for this hospitalization and ongoing recovery are:: "I want to go home and see my dogs."      Expected Discharge Plan and Services Expected Discharge Plan: [place] In-house Referral: Clinical Social Work     Living arrangements for the past 2 months: Single Family Home                                      Prior Living Arrangements/Services Living arrangements for the past 2 months: Single Family Home Lives with:: Spouse Odaly, Peri (Spouse)   [phone] (Mobile)) Patient language and need for interpreter reviewed:: Yes Do you feel safe going back to the place where you live?: Yes      Need for Family Participation in Patient Care: Yes (Comment) Care giver support system in  place?: Yes (comment)   Criminal Activity/Legal Involvement Pertinent to Current Situation/Hospitalization: No - Comment as needed  Activities of Daily Living Home Assistive Devices/Equipment: Eyeglasses ADL Screening (condition at time of admission) Patient's cognitive ability adequate to safely complete daily activities?: No (Dx Alzheimer's) Is the patient deaf or have difficulty hearing?: No Does the patient have difficulty seeing, even when wearing glasses/contacts?: No Does the patient have difficulty concentrating, remembering, or making decisions?: Yes (Dx Alzheimer's) Does the patient have difficulty dressing or bathing?: Yes Independently performs ADLs?: No Communication: Needs assistance Is this a change from baseline?: Change from baseline, expected to last >3 days Dressing (OT): Needs assistance Is this a change from baseline?: Change from baseline, expected to last >3 days Grooming: Needs assistance Is this a change from baseline?: Change from baseline, expected to last >3 days Feeding: Needs assistance Is this a change from baseline?: Change from baseline, expected to last >3 days Bathing: Needs assistance Is this a change from baseline?: Change from baseline, expected to last >3 days Toileting: Needs assistance Is this a change from baseline?: Change from baseline, expected to last >3days In/Out Bed: Needs assistance Is this a change from baseline?: Change from baseline, expected to last >3 days Walks in Home: Needs assistance Is this a change from baseline?: Change from baseline, expected to last >3 days Does the patient have difficulty walking or climbing stairs?: Yes (sp  fall) Weakness of Legs: Both (Mostly Left leg) Weakness of Arms/Hands: None  Permission Sought/Granted Permission sought to share information with : Facility Art therapist granted to share information with : Yes, Verbal Permission Granted  Share Information with NAME:  Ladonna, Vanorder (Spouse)   [phone] (Mobile)  Permission granted to share info w AGENCY: [facility]        Emotional Assessment Appearance:: Appears stated age Attitude/Demeanor/Rapport: Gracious Affect (typically observed): Pleasant Orientation: : Oriented to Self,Fluctuating Orientation (Suspected and/or reported Sundowners) Alcohol / Substance Use: Not Applicable Psych Involvement: No (comment)  Admission diagnosis:  Fall [W19.XXXA] Idiopathic thrombocythemia ([place]) [D47.3] Fall, initial encounter [W19.XXXA] Closed fracture of neck of left femur, initial encounter ([place]) [S72.002A] Closed displaced fracture of left femoral neck ([place]) [S72.002A] Patient Active Problem List   Diagnosis Date Noted  . Closed displaced fracture of left femoral neck ([place]) 10/30/2020  . Hiatal hernia   . GERD (gastroesophageal reflux disease)   . Rectal bleeding 08/18/2020  . Leukocytosis 08/18/2020  . Failure to thrive in adult 02/16/2020  . Thrombocytopenia ([place]) 02/15/2020  . Pneumonia 07/10/2019  . Laceration of left lower leg 04/10/2019  . Chronic pain disorder 01/11/2019  . OSA (obstructive sleep apnea) 09/28/2018  . Ulcer of lower limb, right, limited to breakdown of skin ([place]) 07/22/2018  . Traumatic hematoma of right lower leg 07/12/2018  . Asthma without status asthmaticus 11/25/2016  . Late onset Alzheimer's disease without behavioral disturbance ([place]) 12/30/2015  . Back muscle spasm 09/09/2015  . Clinical depression 04/12/2015  . Herpes zona 04/12/2015  . Chronic ITP (idiopathic thrombocytopenic purpura) (HCC) 03/15/2015  . Chest pain 10/22/2014  . Breathlessness on exertion 10/22/2014  . Osteopenia 10/18/2014  . Benign essential hypertension 06/28/2014  . HLD (hyperlipidemia) 06/28/2014  . Idiopathic thrombocythemia ([place]) 06/28/2014  . Acquired hypothyroidism 06/04/2014  . Depressive disorder 06/04/2014  . Bursitis, trochanteric 03/27/2014  . DDD (degenerative  disc disease), lumbar 02/27/2014  . Neuritis or radiculitis due to rupture of lumbar intervertebral disc 02/27/2014  . Lumbar radiculitis 02/27/2014   PCP:  Idelle Crouch, MD Pharmacy:   [facility] [address] [place] [address] [place] [address] Phone: [phone] Fax: [facility] [place], [place] [address] Phone: [phone] Fax: [phone]     Social Determinants of Health (SDOH) Interventions    Readmission Risk Interventions No flowsheet data found.

## 2020-10-31 NOTE — H&P (Signed)
Paper H&P to be scanned into permanent record. H&P reviewed. No significant changes noted.  

## 2020-10-31 NOTE — Transfer of Care (Signed)
Immediate Anesthesia Transfer of Care Note  Patient: Ariana [name]  Procedure(s) Performed: CANNULATED HIP PINNING (Left Hip)  Patient Location: PACU  Anesthesia Type:General  Level of Consciousness: awake  Airway & Oxygen Therapy: Patient connected to face mask oxygen  Post-op Assessment: Post -op Vital signs reviewed and stable  Post vital signs: stable  Last Vitals:  Vitals Value Taken Time  BP 135/55 10/31/20 1815  Temp 36.8 C 10/31/20 1745  Pulse 77 10/31/20 1829  Resp 15 10/31/20 1829  SpO2 97 % 10/31/20 1829  Vitals shown include unvalidated device data.  Last Pain:  Vitals:   10/31/20 1446  TempSrc: Temporal  PainSc:       Patients Stated Pain Goal:  (UTD) (07/04/29 0762)  Complications: No complications documented.

## 2020-10-31 NOTE — Anesthesia Preprocedure Evaluation (Addendum)
Anesthesia Evaluation  Patient identified by MRN, date of birth, ID band Patient confused    Reviewed: Allergy & Precautions, H&P , NPO status , Patient's Chart, lab work & pertinent test results, reviewed documented beta blocker date and time   Airway Mallampati: II  TM Distance: >3 FB Neck ROM: full    Dental  (+) Poor Dentition, Teeth Intact   Pulmonary neg shortness of breath, asthma , sleep apnea , pneumonia, resolved,    Pulmonary exam normal        Cardiovascular Exercise Tolerance: Poor hypertension, On Medications + dysrhythmias Atrial Fibrillation  Rhythm:regular Rate:Normal     Neuro/Psych PSYCHIATRIC DISORDERS Anxiety Depression Dementia  Neuromuscular disease    GI/Hepatic Neg liver ROS, hiatal hernia, GERD  Medicated,  Endo/Other  Hypothyroidism   Renal/GU negative Renal ROS  negative genitourinary   Musculoskeletal   Abdominal   Peds  Hematology  (+) Blood dyscrasia, anemia , Chronic ITP, platelets in the 20s-40s. Last took ITP medication yesterday; per heme notes, it usually bumps her platelets to normal levels temporarily.   Anesthesia Other Findings Past Medical History: No date: Alzheimer disease ([place]) No date: Anemia No date: Anxiety No date: Asthma     Comment:  WELL CONTROLLED No date: Chronic back pain No date: Depression No date: Depression No date: GERD (gastroesophageal reflux disease) No date: Hiatal hernia No date: Hypercholesteremia No date: Hypothyroidism No date: ITP (idiopathic thrombocytopenic purpura)     Comment:  FOLLOWED BR DR Grayland Ormond No date: LBBB (left bundle branch block) No date: Osteoarthritis No date: Osteoporosis No date: Restless legs Past Surgical History: No date: CARPAL TUNNEL RELEASE 09/10/2015: ESOPHAGOGASTRODUODENOSCOPY (EGD) WITH PROPOFOL; N/A     Comment:  Procedure: ESOPHAGOGASTRODUODENOSCOPY (EGD) WITH               PROPOFOL;  Surgeon: Lollie Sails, MD;  Location[facility]               [facility] ENDOSCOPY;  Service: Endoscopy;  Laterality: N/A; 07/13/2018: IRRIGATION AND DEBRIDEMENT HEMATOMA; Right     Comment:  Procedure: IRRIGATION AND DEBRIDEMENT HEMATOMA-RIGHT               SHIN;  Surgeon: Herbert Pun, MD;  Location:               [facility] ORS;  Service: General;  Laterality: Right; 05/02/2019: KYPHOPLASTY; N/A     Comment:  Procedure: L1 KYPHOPLASTY;  Surgeon: Hessie Knows, MD;               Location: [facility] ORS;  Service: Orthopedics;  Laterality:               N/A; No date: LUMBAR DISC SURGERY No date: SPLENECTOMY, PARTIAL No date: TOTAL ABDOMINAL HYSTERECTOMY   Reproductive/Obstetrics negative OB ROS                             Anesthesia Physical  Anesthesia Plan  ASA: III  Anesthesia Plan: General ETT   Post-op Pain Management:    Induction: Intravenous  PONV Risk Score and Plan: 3 and Ondansetron, Dexamethasone and Treatment may vary due to age or medical condition  Airway Management Planned: Oral ETT  Additional Equipment: None  Intra-op Plan:   Post-operative Plan: Extubation in OR  Informed Consent: I have reviewed the patients History and Physical, chart, labs and discussed the procedure including the risks, benefits and alternatives for the proposed anesthesia with the patient or  authorized representative who has indicated his/her understanding and acceptance.   Patient has DNR.  Discussed DNR with patient, Discussed DNR with power of attorney and Suspend DNR.   Dental Advisory Given and Dental advisory given  Plan Discussed with: CRNA  Anesthesia Plan Comments: (Discussed risks of anesthesia with patient and husband/POA at bedside, including PONV, sore throat, lip/dental damage. Rare risks discussed as well, such as cardiorespiratory and neurological sequelae. Patient understands.  Patient chronic ITP and low platelet levels. Today's CBC showed clumped platelets;  hematology requesting repeat. Will await results and proceed accordingly in consultation with hematologist. Patient and husband understand potential increased risk of bleeding  Addendum: patient's repeat PLT = 18,000. Per hematologist Dr Grayland Ormond, if surgery could not be postponed a few days, he would give 2u PLT to the patients for the procedure. Spoke to Dr Posey Pronto and relayed this and he would like to proceed with surgery given elevated morbidity with waiting. I spoke to Blood Bank who said platelets were at the minimum an hour and a half away. Told this to surgeon and OR staff. Will wait for platelets)      Anesthesia Quick Evaluation

## 2020-10-31 NOTE — Progress Notes (Addendum)
Pt admitted to 1A via stretcher on RA accompanied by ER Nurse. Pt belongings noted on the stretcher.

## 2020-10-31 NOTE — Plan of Care (Addendum)
Pt oriented to self. Confused and forgetful. Can be reoriented, but forgetful. Pt has been calm and cooperative but can get agitated at times when she needs to urinate. Pt able to take pills whole but due to frequents coughing spells, meds crushed and mixed in applesauce. Effective. History of chronic coughs. Tessalon Pearls adm.  Pt has been NPO since midnight. Vitals stable.  Reinforcement and education needed to motivate pt to follow commands. Prn Pain medS adm for L hip pain. On RA. Safety measures in place. Will continue to monitor.  Problem: Education: Goal: Knowledge of General Education information will improve Description: Including pain rating scale, medication(s)/side effects and non-pharmacologic comfort measures Outcome: Progressing   Problem: Health Behavior/Discharge Planning: Goal: Ability to manage health-related needs will improve Outcome: Progressing   Problem: Clinical Measurements: Goal: Ability to maintain clinical measurements within normal limits will improve Outcome: Progressing Goal: Will remain free from infection Outcome: Progressing Goal: Diagnostic test results will improve Outcome: Progressing Goal: Respiratory complications will improve Outcome: Progressing Goal: Cardiovascular complication will be avoided Outcome: Progressing   Problem: Activity: Goal: Risk for activity intolerance will decrease Outcome: Progressing   Problem: Nutrition: Goal: Adequate nutrition will be maintained Outcome: Progressing   Problem: Coping: Goal: Level of anxiety will decrease Outcome: Progressing   Problem: Elimination: Goal: Will not experience complications related to bowel motility Outcome: Progressing Goal: Will not experience complications related to urinary retention Outcome: Progressing   Problem: Pain Managment: Goal: General experience of comfort will improve Outcome: Progressing   Problem: Safety: Goal: Ability to remain free from injury will  improve Outcome: Progressing   Problem: Skin Integrity: Goal: Risk for impaired skin integrity will decrease Outcome: Progressing

## 2020-10-31 NOTE — Progress Notes (Signed)
Initial Nutrition Assessment  DOCUMENTATION CODES:   Not applicable  INTERVENTION:  Once diet advanced continue -Ensure Enlive po BID, each supplement provides 350 kcal and 20 grams of protein (chocolate)  -Ocuvite daily for post op wound healing (provides zinc, vitamin A, vitamin C, Vitamin E, copper, and selenium)   NUTRITION DIAGNOSIS:   Increased nutrient needs related to post-op healing as evidenced by estimated needs.    GOAL:   Patient will meet greater than or equal to 90% of their needs    MONITOR:   Weight trends,Labs,I & O's,Diet advancement,Supplement acceptance,Skin,PO intake  REASON FOR ASSESSMENT:   Consult Assessment of nutrition requirement/status (hip fx)  ASSESSMENT:  87 year old female admitted for left hip fracture slipping on ice at home resulting in mechanical fall. Past medical history significant of ITP, Alzheimer's dementia, depression, hypothyroidism, anxiety disorder, and asthma, HLD, osteoarthritis, GERD, and LBBB.  Pt resting quietly in bed this morning, husband present in room. Pt reports having some pain and frequently falling asleep during visit, most of history provided by husband. Pt has a good appetite and eats "pretty good" at home, drinks CIB every morning with breakfast. Husband reports patients weights fluctuate 127-133 lbs. Currently pt weighs 67.1 kg (147.62 lbs) which reflects ~25 lb increase in wt in the last month. Question the accuracy of current wt, will use reported usual weight (59.1 kg) for estimating needs. RD educated on the importance of adequate intake and increased protein needs to support post-op healing. Patient agreeable to drinking chocolate Ensure with diet advancement. Husband reports patient has been complaining of needing to go to the bathroom, Purewick not in place. RD left room and informed nursing staff.   Medications reviewed and include: Vit C, Wellbutrin, Oscal with D, D3, Celexa, Aricept, Gabapentin,  Lamictal, Namenda, Ocuvite, Protonix, Senokot, Vit E, B12, ANncef, Robaxin  IVF: LR @ 10 ml/hr  Labs: reviewed  NUTRITION - FOCUSED PHYSICAL EXAM:  Flowsheet Row Most Recent Value  Orbital Region Mild depletion  Upper Arm Region Moderate depletion  Thoracic and Lumbar Region Unable to assess  Buccal Region Moderate depletion  Temple Region Mild depletion  Clavicle Bone Region Mild depletion  Clavicle and Acromion Bone Region Unable to assess  Scapular Bone Region Unable to assess  Dorsal Hand Mild depletion  Patellar Region Unable to assess  Anterior Thigh Region Unable to assess  Posterior Calf Region Unable to assess  Edema (RD Assessment) Mild  Hair Reviewed  Eyes Reviewed  Mouth Reviewed  Skin Reviewed  Nails Reviewed       Diet Order:   Diet Order            Diet NPO time specified  Diet effective midnight                 EDUCATION NEEDS:   Education needs have been addressed  Skin:  Skin Assessment: Reviewed RN Assessment (scattered ecchymosis)  Last BM:  1/18 per husband report  Height:   Ht Readings from Last 1 Encounters:  10/31/20 5\' 2"  (1.575 m)    Weight:   Wt Readings from Last 1 Encounters:  10/31/20 67.1 kg    BMI:  Body mass index is 27.06 kg/m.  Estimated Nutritional Needs:   Kcal:  1650-1850  Protein:  76-90  Fluid:  >1.4 L    Lajuan Lines, RD, LDN Clinical Nutrition After Hours/Weekend Pager # in [place]

## 2020-11-01 ENCOUNTER — Encounter: Payer: Self-pay | Admitting: Orthopedic Surgery

## 2020-11-01 DIAGNOSIS — S72002A Fracture of unspecified part of neck of left femur, initial encounter for closed fracture: Secondary | ICD-10-CM | POA: Diagnosis not present

## 2020-11-01 LAB — CBC
HCT: 31.2 % — ABNORMAL LOW (ref 36.0–46.0)
HCT: 26.8 % — ABNORMAL LOW (ref 36.0–46.0)
Hemoglobin: 10 g/dL — ABNORMAL LOW (ref 12.0–15.0)
Hemoglobin: 9 g/dL — ABNORMAL LOW (ref 12.0–15.0)
MCH: 28.2 pg (ref 26.0–34.0)
MCH: 28.8 pg (ref 26.0–34.0)
MCHC: 32.1 g/dL (ref 30.0–36.0)
MCHC: 33.6 g/dL (ref 30.0–36.0)
MCV: 88.1 fL (ref 80.0–100.0)
MCV: 85.9 fL (ref 80.0–100.0)
Platelets: 16 10*3/uL — CL (ref 150–400)
Platelets: 11 10*3/uL — CL (ref 150–400)
RBC: 3.54 MIL/uL — ABNORMAL LOW (ref 3.87–5.11)
RBC: 3.12 MIL/uL — ABNORMAL LOW (ref 3.87–5.11)
RDW: 18.1 % — ABNORMAL HIGH (ref 11.5–15.5)
RDW: 18.6 % — ABNORMAL HIGH (ref 11.5–15.5)
WBC: 12.5 10*3/uL — ABNORMAL HIGH (ref 4.0–10.5)
WBC: 16.6 10*3/uL — ABNORMAL HIGH (ref 4.0–10.5)
nRBC: 0 % (ref 0.0–0.2)
nRBC: 0 % (ref 0.0–0.2)

## 2020-11-01 LAB — BASIC METABOLIC PANEL
Anion gap: 10 (ref 5–15)
BUN: 18 mg/dL (ref 8–23)
CO2: 24 mmol/L (ref 22–32)
Calcium: 8.6 mg/dL — ABNORMAL LOW (ref 8.9–10.3)
Chloride: 106 mmol/L (ref 98–111)
Creatinine, Ser: 0.81 mg/dL (ref 0.44–1.00)
GFR, Estimated: >60 mL/min (ref >60)
Glucose, Bld: 131 mg/dL — ABNORMAL HIGH (ref 70–99)
Potassium: 4.3 mmol/L (ref 3.5–5.1)
Sodium: 140 mmol/L (ref 135–145)

## 2020-11-01 LAB — PREPARE PLATELET PHERESIS
Unit division: 0
Unit division: 0

## 2020-11-01 LAB — BPAM PLATELET PHERESIS
Blood Product Expiration Date: 202201212359
Blood Product Expiration Date: 202201232359
ISSUE DATE / TIME: 202201201609
ISSUE DATE / TIME: 202201201609
Unit Type and Rh: 5100
Unit Type and Rh: 5100

## 2020-11-01 MED ORDER — TRAMADOL HCL 50 MG PO TABS: 50.0000 mg | ORAL_TABLET | Freq: Four times a day (QID) | ORAL | 0 refills | Status: DC | PRN

## 2020-11-01 NOTE — Progress Notes (Signed)
PT Cancellation Note  Patient Details Name: Ariana [name] MRN: [id number] DOB: 07/16/1933   Cancelled Treatment:    Reason Eval/Treat Not Completed: Medical issues which prohibited therapy.  Medical issues which prohibited therapy. PT order received and chart reviewed. Pt with critically low platelet count of 16 which is contraindicated for PT intervention. PT to hold today and will follow up when pt is medically able to participate.  Gwenlyn Saran, PT, DPT 11/01/20, 9:10 AM

## 2020-11-01 NOTE — Discharge Instructions (Signed)
INSTRUCTIONS AFTER Surgery  o Remove items at home which could result in a fall. This includes throw rugs or furniture in walking pathways o ICE to the affected joint every three hours while awake for 30 minutes at a time, for at least the first 3-5 days, and then as needed for pain and swelling.  Continue to use ice for pain and swelling. You may notice swelling that will progress down to the foot and ankle.  This is normal after surgery.  Elevate your leg when you are not up walking on it.   o Continue to use the breathing machine you got in the hospital (incentive spirometer) which will help keep your temperature down.  It is common for your temperature to cycle up and down following surgery, especially at night when you are not up moving around and exerting yourself.  The breathing machine keeps your lungs expanded and your temperature down.   DIET:  As you were doing prior to hospitalization, we recommend a well-balanced diet.  DRESSING / WOUND CARE / SHOWERING  Dressing changes as needed.  No showering.  Staples will be removed in 2 weeks at [facility] clinic orthopedics.  ACTIVITY  o Increase activity slowly as tolerated, but follow the weight bearing instructions below.   o No driving for 6 weeks or until further direction given by your physician.  You cannot drive while taking narcotics.  o No lifting or carrying greater than 10 lbs. until further directed by your surgeon. o Avoid periods of inactivity such as sitting longer than an hour when not asleep. This helps prevent blood clots.  o You may return to work once you are authorized by your doctor.     WEIGHT BEARING  Weightbearing as tolerated on the left   EXERCISES Gait training and ambulation training.  Range of motion and strengthening.  Occupational Therapy for ADLs.  CONSTIPATION  Constipation is defined medically as fewer than three stools per week and severe constipation as less than one stool per week.  Even if you  have a regular bowel pattern at home, your normal regimen is likely to be disrupted due to multiple reasons following surgery.  Combination of anesthesia, postoperative narcotics, change in appetite and fluid intake all can affect your bowels.   YOU MUST use at least one of the following options; they are listed in order of increasing strength to get the job done.  They are all available over the counter, and you may need to use some, POSSIBLY even all of these options:    Drink plenty of fluids (prune juice may be helpful) and high fiber foods Colace 100 mg by mouth twice a day  Senokot for constipation as directed and as needed Dulcolax (bisacodyl), take with full glass of water  Miralax (polyethylene glycol) once or twice a day as needed.  If you have tried all these things and are unable to have a bowel movement in the first 3-4 days after surgery call either your surgeon or your primary doctor.    If you experience loose stools or diarrhea, hold the medications until you stool forms back up.  If your symptoms do not get better within 1 week or if they get worse, check with your doctor.  If you experience "the worst abdominal pain ever" or develop nausea or vomiting, please contact the office immediately for further recommendations for treatment.   ITCHING:  If you experience itching with your medications, try taking only a single pain pill, or  even half a pain pill at a time.  You can also use Benadryl over the counter for itching or also to help with sleep.   TED HOSE STOCKINGS:  Use stockings on both legs until for at least 2 weeks or as directed by physician office. They may be removed at night for sleeping.  MEDICATIONS:  See your medication summary on the "After Visit Summary" that nursing will review with you.  You may have some home medications which will be placed on hold until you complete the course of blood thinner medication.  It is important for you to complete the blood thinner  medication as prescribed.  PRECAUTIONS:  If you experience chest pain or shortness of breath - call 911 immediately for transfer to the hospital emergency department.   If you develop a fever greater that 101 F, purulent drainage from wound, increased redness or drainage from wound, foul odor from the wound/dressing, or calf pain - CONTACT YOUR SURGEON.                                                   FOLLOW-UP APPOINTMENTS:  If you do not already have a post-op appointment, please call the office for an appointment to be seen by your surgeon.  Guidelines for how soon to be seen are listed in your "After Visit Summary", but are typically between 1-4 weeks after surgery.  OTHER INSTRUCTIONS:     MAKE SURE YOU:  . Understand these instructions.  . Get help right away if you are not doing well or get worse.    Thank you for letting us be a part of your medical care team.  It is a privilege we respect greatly.  We hope these instructions will help you stay on track for a fast and full recovery!

## 2020-11-01 NOTE — Care Management Important Message (Signed)
Important Message  Patient Details  Name: Ariana [name] MRN: [id number] Date of Birth: 10/13/1932   Medicare Important Message Given:  Yes     [name] 11/01/2020, 11:37 AM

## 2020-11-01 NOTE — Progress Notes (Signed)
°  Subjective: 1 Day Post-Op Procedure(s) (LRB): CANNULATED HIP PINNING (Left) Patient reports pain as mild.   Patient is well, and has had no acute complaints or problems Plan is to go home versus Rehab after hospital stay. Negative for chest pain and shortness of breath Fever: no Gastrointestinal: Negative for nausea and vomiting  Objective: Vital signs in last 24 hours: Temp:  [97.2 F (36.2 C)-98.9 F (37.2 C)] 98.5 F (36.9 C) (01/21 0411) Pulse Rate:  [68-83] 80 (01/21 0411) Resp:  [12-16] 16 (01/20 2036) BP: (118-170)/(52-84) 126/84 (01/21 0411) SpO2:  [92 %-100 %] 96 % (01/21 0411) Weight:  [67.1 kg] 67.1 kg (01/20 1136)  Intake/Output from previous day:  Intake/Output Summary (Last 24 hours) at 11/01/2020 0647 Last data filed at 11/01/2020 0411 Gross per 24 hour  Intake 2069.5 ml  Output 250 ml  Net 1819.5 ml    Intake/Output this shift: Total I/O In: -  Out: 200 [Urine:200]  Labs: Recent Labs    10/30/20 0850 10/31/20 0529 10/31/20 1057 11/01/20 0346  HGB 10.7* 10.6* 11.3* 10.0*   Recent Labs    10/31/20 1057 11/01/20 0346  WBC 13.2* 12.5*  RBC 4.04 3.54*  HCT 35.1* 31.2*  PLT 18* 16*   Recent Labs    10/31/20 0529 11/01/20 0346  NA 143 140  K 4.1 4.3  CL 107 106  CO2 30 24  BUN 17 18  CREATININE 0.83 0.81  GLUCOSE 84 131*  CALCIUM 9.0 8.6*   Recent Labs    10/30/20 0935  INR 1.1     EXAM General - Patient is Alert and Confused Extremity - Neurovascular intact Sensation intact distally Dorsiflexion/Plantar flexion intact Compartment soft Dressing/Incision - clean, dry, no drainage Motor Function - intact, moving foot and toes well on exam.   Past Medical History:  Diagnosis Date   Alzheimer disease ([place])    Anemia    Anxiety    Asthma    WELL CONTROLLED   Chronic back pain    Depression    Depression    GERD (gastroesophageal reflux disease)    Hiatal hernia    Hypercholesteremia    Hypothyroidism     ITP (idiopathic thrombocytopenic purpura)    FOLLOWED BR DR Grayland Ormond   LBBB (left bundle branch block)    Osteoarthritis    Osteoporosis    Restless legs     Assessment/Plan: 1 Day Post-Op Procedure(s) (LRB): CANNULATED HIP PINNING (Left) Principal Problem:   Closed displaced fracture of left femoral neck (HCC) Active Problems:   Chronic ITP (idiopathic thrombocytopenic purpura) (HCC)   Acquired hypothyroidism   Depressive disorder   Benign essential hypertension   Late onset Alzheimer's disease without behavioral disturbance (HCC)   Idiopathic thrombocythemia (HCC)   Hiatal hernia   GERD (gastroesophageal reflux disease)  Estimated body mass index is 27.06 kg/m as calculated from the following:   Height as of this encounter: 5\' 2"  (1.575 m).   Weight as of this encounter: 67.1 kg. Advance diet Up with therapy  Platelet count 16 this morning.  Oncology and internal medicine are following.  Appreciated.  Discharge planning with follow-up to [facility] in 2 weeks.  DVT Prophylaxis - Lovenox, Foot Pumps and TED hose Weight-Bearing as tolerated to left leg  Reche Dixon, PA-C Orthopaedic Surgery 11/01/2020, 6:47 AM

## 2020-11-01 NOTE — Progress Notes (Signed)
[facility] Progress Note  Patient: Ariana [name]    ASN:[id number]  DOA: 10/30/2020     Date of Service: the patient was seen and examined on 11/01/2020  Chief Complaint  Patient presents with  . Fall   Brief hospital course: Jenefer Woerner is a 87 y.o. female with medical history significant for ITP, Alzheimer's dementia, depression, hypothyroidism, anxiety disorder and asthma who arrives to the ER via EMS for evaluation of left hip pain following a fall.  Patient was walking her dog and slipped on ice, landing on her left side.  She was unable to get up or bear weight on her left side and so EMS was called.  Patient with complaints of left shoulder, left hip and pain in the left side of the face. She denies any loss of consciousness or headache.  She denies having any chest pain, no shortness of breath, no nausea, no vomiting, no diarrhea, no abdominal pain, no urinary frequency, nocturia or dysuria.  No cough, no fever, no chills, no dizziness or lightheadedness. Labs show sodium 140, potassium 3.7, chloride 105, bicarb 25, glucose 109, BUN 20, creatinine 0.92, calcium 9.3, alk phos 90, albumin 3.7, AST 30, ALT 14, total protein 6.1, troponin 10, white count 13.5, hemoglobin 10.7, hematocrit 34.7, MCV 89, RDW 17, platelet count 32,, PT 13.3, INR 1.1 Respiratory viral panel is negative Chest x-ray reviewed by me shows no acute findings X-ray of the left hip shows cortical irregularity and slight angulation in the subcapital left femoral neck suspicious for nondisplaced subcapital left femoral neck fracture. No left hip dislocation. Consider left hip CT for further evaluation. CT scan of the pelvis without contrast shows LEFT femoral neck subcapital impaction fracture. Twelve-lead EKG reviewed by me shows sinus rhythm with PACs, left bundle branch block and left axis deviation.   ED Course: Patient is an 87 year old female with a history of ITP, dementia and  depression who presents to the ER for evaluation following a fall.  She has a left femoral neck fracture and will be admitted to the hospital for further evaluation.  Assessment and Plan: Principal Problem:   Closed displaced fracture of left femoral neck (HCC) Active Problems:   Chronic ITP (idiopathic thrombocytopenic purpura) (HCC)   Acquired hypothyroidism   Depressive disorder   Benign essential hypertension   Late onset Alzheimer's disease without behavioral disturbance (HCC)   Idiopathic thrombocythemia (HCC)   Hiatal hernia   GERD (gastroesophageal reflux disease)       Closed displaced fracture of left femoral neck Status post mechanical fall Pain control Continue fall precautions  Orthopedic surgery, s/p ORIF done on 10/31/2020  Follow PT and OT eval for placement Hold Lovenox until platelet count >>50,000 as per hematologist   Idiopathic thrombocytopenic purpura Chronic Patient has a platelet count of 32,000 with no evidence of bleeding  hematology consult appreciated Patient received one dose of Nplate plts droppe to 18 K, s/p 2 units of platelet transfusion before and during surgery given as per oncology 1/21 platelet count 16k, no transfusion until platelet count drops <<10,000 as per hematology   Hypothyroidism Continue Synthroid   Alzheimer's dementia without behavioral disturbance Continue Aricept and Namenda   Depression and anxiety Continue as needed alprazolam Continue Wellbutrin and BuSpar    Hernia with GERD  Continue Protonix    Body mass index is 27.06 kg/m.  Nutrition Problem: Increased nutrient needs Etiology: post-op healing Interventions: Interventions: MVI,Ensure Enlive (each supplement provides 350kcal and 20 grams of protein)  Diet: Regular diet,  DVT Prophylaxis: SCD, pharmacological prophylaxis contraindicated due to Thrombocytopenia, risk of bleeding   Advance goals of care discussion: Full  code  Family Communication: family was present at bedside, at the time of interview.  The pt provided permission to discuss medical plan with the family. Opportunity was given to ask question and all questions were answered satisfactorily.   Disposition:  Pt is from Home, admitted with fall and left femur fracture, s/p ORIF done on 10/31/20 Awaiting for PT and OT eval and then SNF placement most likely TOC following for placement  Subjective: No significant overnight events, patient still has significant pain which is controlled with pain medications.  Patient was anxious and she was worried about her dogs at home as per hospital and she started tearing during my interview.  Patient's husband was at bedside.   Physical Exam: General:  alert oriented to time, place, and person.  Appear in mild distress, affect appropriate Eyes: PERRLA ENT: Oral Mucosa Clear, moist  Neck: no JVD,  Cardiovascular: S1 and S2 Present, no Murmur,  Respiratory: good respiratory effort, Bilateral Air entry equal and Decreased, no Crackles, no wheezes Abdomen: Bowel Sound present, Soft and no tenderness,  Skin: no rashes Extremities: no Pedal edema, no calf tenderness, bilateral chronic pigmentation.  Left hip tenderness, dressing CDI Neurologic: without any new focal findings Gait not checked due to patient safety concerns  Vitals:   10/31/20 2158 10/31/20 2221 11/01/20 0411 11/01/20 0819  BP: (!) 142/67 (!) 148/71 126/84 (!) 148/64  Pulse: 83 81 80 74  Resp:    18  Temp: 98.2 F (36.8 C) 97.8 F (36.6 C) 98.5 F (36.9 C) 98.6 F (37 C)  TempSrc:   Oral Oral  SpO2: 99% 98% 96% 96%  Weight:      Height:        Intake/Output Summary (Last 24 hours) at 11/01/2020 1144 Last data filed at 11/01/2020 0930 Gross per 24 hour  Intake 2549.5 ml  Output 250 ml  Net 2299.5 ml   Filed Weights   10/30/20 0846 10/31/20 1136  Weight: 67.1 kg 67.1 kg    Data Reviewed: I have personally reviewed and  interpreted daily labs, tele strips, imagings as discussed above. I reviewed all nursing notes, pharmacy notes, vitals, pertinent old records I have discussed plan of care as described above with RN and patient/family.  CBC: Recent Labs  Lab 10/30/20 0850 10/31/20 0529 10/31/20 1057 11/01/20 0346  WBC 13.5* 12.2* 13.2* 12.5*  NEUTROABS  --   --  10.2*  --   HGB 10.7* 10.6* 11.3* 10.0*  HCT 34.7* 33.7* 35.1* 31.2*  MCV 89.0 88.0 86.9 88.1  PLT 32* PLATELET CLUMPS NOTED ON SMEAR, UNABLE TO ESTIMATE 18* 16*   Basic Metabolic Panel: Recent Labs  Lab 10/30/20 0850 10/31/20 0529 11/01/20 0346  NA 140 143 140  K 3.7 4.1 4.3  CL 105 107 106  CO2 $Re'25 30 24  'BqH$ GLUCOSE 109* 84 131*  BUN $Re'20 17 18  'cHp$ CREATININE 0.92 0.83 0.81  CALCIUM 9.3 9.0 8.6*    Studies: DG HIP OPERATIVE UNILAT W OR W/O PELVIS LEFT  Result Date: 10/31/2020 CLINICAL DATA:  Hip fixation EXAM: OPERATIVE left HIP (WITH PELVIS IF PERFORMED)  VIEWS TECHNIQUE: Fluoroscopic spot image(s) were submitted for interpretation post-operatively. COMPARISON:  None. FINDINGS: Five intraop views were submitted for review. The patient is status post ORIF cannulated pin fixation of the left femoral neck fracture. Fluoro time 1 minutes 31 seconds  IMPRESSION: Status post ORIF pin fixation of the femoral neck fracture Electronically Signed   By: Prudencio Pair M.D.   On: 10/31/2020 19:28    Scheduled Meds: . acetaminophen  1,000 mg Oral Q8H  . vitamin C  500 mg Oral Daily  . benzonatate  200 mg Oral TID  . buPROPion  150 mg Oral Daily  . busPIRone  15 mg Oral BID  . calcium-vitamin D  1 tablet Oral Daily  . cholecalciferol  2,000 Units Oral Daily  . citalopram  20 mg Oral BH-q7a  . docusate sodium  100 mg Oral BID  . donepezil  10 mg Oral BID  . feeding supplement  237 mL Oral BID BM  . gabapentin  300 mg Oral QID  . ketorolac  7.5 mg Intravenous Q6H  . lamoTRIgine  50 mg Oral QHS  . levothyroxine  50 mcg Oral QAC breakfast  .  memantine  10 mg Oral BID  . montelukast  10 mg Oral QHS  . multivitamin-lutein  1 capsule Oral BID  . oxybutynin  5 mg Oral TID  . pantoprazole  20 mg Oral BID  . pravastatin  20 mg Oral QHS  . senna  1 tablet Oral BID  . vitamin B-12  1,000 mcg Oral Daily  . vitamin E  400 Units Oral Daily   Continuous Infusions: . sodium chloride 75 mL/hr at 10/31/20 2256  . methocarbamol (ROBAXIN) IV     PRN Meds: albuterol, ALPRAZolam, bisacodyl, carboxymethylcellul-glycerin, fluticasone, HYDROmorphone (DILAUDID) injection, methocarbamol **OR** methocarbamol (ROBAXIN) IV, metoCLOPramide **OR** metoCLOPramide (REGLAN) injection, ondansetron **OR** ondansetron (ZOFRAN) IV, oxyCODONE, oxyCODONE, senna-docusate, sodium phosphate, traMADol  Time spent: 35 minutes  Author: Val Riles. MD [facility] 11/01/2020 11:44 AM  To reach On-call, see care teams to locate the attending and reach out to them via [email or web address]. If 7PM-7AM, please contact night-coverage If you still have difficulty reaching the attending provider, please page the [facility] (Director on Call) for [facility] on amion for assistance.

## 2020-11-01 NOTE — Progress Notes (Signed)
OT Cancellation Note  Patient Details Name: Ariana [name] MRN: [id number] DOB: May 31, 1933   Cancelled Treatment:    Reason Eval/Treat Not Completed: Medical issues which prohibited therapy. OT order received and chart reviewed. Pt with critically low platelet count of 16 which is contraindicated for OT intervention. OT to hold today and will follow up when pt is medically able to participate.  Darleen Crocker, Frank, OTR/L , CBIS ascom [phone]  11/01/20, 8:14 AM   11/01/2020, 8:11 AM

## 2020-11-02 DIAGNOSIS — S72002A Fracture of unspecified part of neck of left femur, initial encounter for closed fracture: Secondary | ICD-10-CM | POA: Diagnosis not present

## 2020-11-02 LAB — CBC
HCT: 26 % — ABNORMAL LOW (ref 36.0–46.0)
HCT: 27 % — ABNORMAL LOW (ref 36.0–46.0)
HCT: 24 % — ABNORMAL LOW (ref 36.0–46.0)
Hemoglobin: 8.4 g/dL — ABNORMAL LOW (ref 12.0–15.0)
Hemoglobin: 8.8 g/dL — ABNORMAL LOW (ref 12.0–15.0)
Hemoglobin: 7.7 g/dL — ABNORMAL LOW (ref 12.0–15.0)
MCH: 28 pg (ref 26.0–34.0)
MCH: 28.6 pg (ref 26.0–34.0)
MCH: 28 pg (ref 26.0–34.0)
MCHC: 32.3 g/dL (ref 30.0–36.0)
MCHC: 32.6 g/dL (ref 30.0–36.0)
MCHC: 32.1 g/dL (ref 30.0–36.0)
MCV: 86.7 fL (ref 80.0–100.0)
MCV: 87.7 fL (ref 80.0–100.0)
MCV: 87.3 fL (ref 80.0–100.0)
Platelets: 11 10*3/uL — CL (ref 150–400)
Platelets: 9 10*3/uL — CL (ref 150–400)
Platelets: 27 10*3/uL — CL (ref 150–400)
RBC: 3 MIL/uL — ABNORMAL LOW (ref 3.87–5.11)
RBC: 3.08 MIL/uL — ABNORMAL LOW (ref 3.87–5.11)
RBC: 2.75 MIL/uL — ABNORMAL LOW (ref 3.87–5.11)
RDW: 18.4 % — ABNORMAL HIGH (ref 11.5–15.5)
RDW: 18.9 % — ABNORMAL HIGH (ref 11.5–15.5)
RDW: 18.6 % — ABNORMAL HIGH (ref 11.5–15.5)
WBC: 15.8 10*3/uL — ABNORMAL HIGH (ref 4.0–10.5)
WBC: 14.5 10*3/uL — ABNORMAL HIGH (ref 4.0–10.5)
WBC: 15.1 10*3/uL — ABNORMAL HIGH (ref 4.0–10.5)
nRBC: 0 % (ref 0.0–0.2)
nRBC: 0 % (ref 0.0–0.2)
nRBC: 0 % (ref 0.0–0.2)

## 2020-11-02 LAB — BASIC METABOLIC PANEL
Anion gap: 6 (ref 5–15)
BUN: 28 mg/dL — ABNORMAL HIGH (ref 8–23)
CO2: 29 mmol/L (ref 22–32)
Calcium: 8.8 mg/dL — ABNORMAL LOW (ref 8.9–10.3)
Chloride: 105 mmol/L (ref 98–111)
Creatinine, Ser: 0.97 mg/dL (ref 0.44–1.00)
GFR, Estimated: 57 mL/min — ABNORMAL LOW (ref >60)
Glucose, Bld: 104 mg/dL — ABNORMAL HIGH (ref 70–99)
Potassium: 4.2 mmol/L (ref 3.5–5.1)
Sodium: 140 mmol/L (ref 135–145)

## 2020-11-02 MED ORDER — SODIUM CHLORIDE 0.9% IV SOLUTION: Freq: Once | INTRAVENOUS | Status: AC

## 2020-11-02 MED ORDER — IMMUNE GLOBULIN (HUMAN) 10 GM/100ML IV SOLN
1.0000 g/kg | INTRAVENOUS | Status: AC
  Administered 2020-11-02 – 2020-11-03 (×2): 65 g via INTRAVENOUS
  Filled 2020-11-02 (×2): qty 650

## 2020-11-02 MED ORDER — AMLODIPINE BESYLATE 5 MG PO TABS
5.0000 mg | ORAL_TABLET | Freq: Every day | ORAL | Status: DC
  Administered 2020-11-02 – 2020-11-12 (×11): 5 mg via ORAL
  Filled 2020-11-02 (×11): qty 1

## 2020-11-02 NOTE — Assessment & Plan Note (Signed)
#  33 old female patient with a history of chronic ITP currently admitted to hospital for acute fracture; with acute ITP.  #Acute on chronic ITP-platelets today 11; patient having spontaneous bruising on the face.  Patient given Nplate on 1/19. I would recommend starting the patient on IVIG 1 g/kg dose per day x2.  #Rib fracture postop day 1; hold DVT prophylaxis given low platelets.

## 2020-11-02 NOTE — Evaluation (Signed)
Physical Therapy Evaluation Patient Details Name: Ariana [name] MRN: [id number] DOB: 12/01/1932 Today's Date: 11/02/2020   History of Present Illness  Lizmary Nader is a 87 y.o. female with medical history significant for ITP, Alzheimer's dementia, depression, hypothyroidism, anxiety disorder and asthma who arrives to the ER via EMS for evaluation of left hip pain following a fall.  Patient was walking her dog and slipped on ice, landing on her left side. S/p L ORIF on 10/31/20.  Clinical Impression  Pt is a pleasant 87 year old female who was admitted for L fem neck fx s/p ORIF on 10/31/20. Pt performs bed mobility/transfers with mod assist and ambulation with min assist and RW. Pt demonstrates deficits with strength/mobility/endurance/pain. Currently not at baseline level. Would benefit from skilled PT to address above deficits and promote optimal return to PLOF; recommend transition to STR upon discharge from acute hospitalization.     Follow Up Recommendations SNF    Equipment Recommendations  None recommended by PT    Recommendations for Other Services       Precautions / Restrictions Precautions Precautions: Fall Restrictions Weight Bearing Restrictions: Yes LLE Weight Bearing: Weight bearing as tolerated      Mobility  Bed Mobility Overal bed mobility: Needs Assistance Bed Mobility: Supine to Sit     Supine to sit: Min assist Sit to supine: Mod assist   General bed mobility comments: safe technique with cues given for sequencing. Once seated, upright posture noted. No dizziness noted    Transfers Overall transfer level: Needs assistance Equipment used: Rolling walker (2 wheeled) Transfers: Sit to/from Stand Sit to Stand: Mod assist Stand pivot transfers: Mod assist       General transfer comment: pulls up from RW despite cues. Once standing, upright posture noted. Good weight acceptance noted on surgical  leg.  Ambulation/Gait Ambulation/Gait assistance: Min assist Gait Distance (Feet): 10 Feet Assistive device: Rolling walker (2 wheeled) Gait Pattern/deviations: Step-to pattern     General Gait Details: ambulated in room completing 2 turns. Needs assist for balance. Unsteadiness noted. RW used. Quick fatigue  Stairs            Wheelchair Mobility    Modified Rankin (Stroke Patients Only)       Balance Overall balance assessment: Needs assistance Sitting-balance support: No upper extremity supported;Feet supported Sitting balance-Leahy Scale: Good     Standing balance support: Bilateral upper extremity supported Standing balance-Leahy Scale: Fair                               Pertinent Vitals/Pain Pain Assessment: No/denies pain Pain Score: 5  Pain Location: L hip Pain Descriptors / Indicators: Sore;Grimacing Pain Intervention(s): Limited activity within patient's tolerance    Home Living Family/patient expects to be discharged to:: Private residence Living Arrangements: Spouse/significant other Available Help at Discharge: Family;Available 24 hours/day Type of Home: Mobile home Home Access: Stairs to enter     Home Layout: One level Home Equipment: Cane - single point;Walker - 2 wheels Additional Comments: Has SPC + walker at home, but does not use these.    Prior Function Level of Independence: Independent         Comments: Husband drives, assists with heavy IADLs. Independent ADLs     Hand Dominance   Dominant Hand: Right    Extremity/Trunk Assessment   Upper Extremity Assessment Upper Extremity Assessment: Generalized weakness (B UE grossly)    Lower Extremity Assessment Lower  Extremity Assessment: Generalized weakness (R LE 4/5; L LE grossly 3+/5)       Communication   Communication: No difficulties  Cognition Arousal/Alertness: Awake/alert Behavior During Therapy: WFL for tasks assessed/performed;Impulsive Overall  Cognitive Status: History of cognitive impairments - at baseline                                        General Comments      Exercises Other Exercises Other Exercises: Pt educated re: OT role, DME recs, d/c recs, falls prevention, ECS, call bell Other Exercises: LBD, toileting, sup<>sit, sit<>stand, sitting/standing balance/tolerance Other Exercises: supine ther-ex performed on B LE including SLRs, hip abd/add, and heel slides. All ther0ex performed x 10 reps with min assist   Assessment/Plan    PT Assessment Patient needs continued PT services  PT Problem List Decreased strength;Decreased balance;Decreased mobility;Pain       PT Treatment Interventions DME instruction;Gait training;Therapeutic exercise;Balance training    PT Goals (Current goals can be found in the Care Plan section)  Acute Rehab PT Goals Patient Stated Goal: To return home safely PT Goal Formulation: With patient Time For Goal Achievement: 11/16/20 Potential to Achieve Goals: Good    Frequency 7X/week   Barriers to discharge        Co-evaluation               AM-PAC PT "6 Clicks" Mobility  Outcome Measure Help needed turning from your back to your side while in a flat bed without using bedrails?: A Little Help needed moving from lying on your back to sitting on the side of a flat bed without using bedrails?: A Little Help needed moving to and from a bed to a chair (including a wheelchair)?: A Little Help needed standing up from a chair using your arms (e.g., wheelchair or bedside chair)?: A Little Help needed to walk in hospital room?: A Lot Help needed climbing 3-5 steps with a railing? : A Lot 6 Click Score: 16    End of Session Equipment Utilized During Treatment: Gait belt Activity Tolerance: Patient tolerated treatment well Patient left: in chair;with chair alarm set Nurse Communication: Mobility status PT Visit Diagnosis: Muscle weakness (generalized)  (M62.81);Difficulty in walking, not elsewhere classified (R26.2);Pain Pain - Right/Left: Left Pain - part of body: Hip    Time: 5465-0354 PT Time Calculation (min) (ACUTE ONLY): 19 min   Charges:   PT Evaluation $PT Eval Low Complexity: 1 Low PT Treatments $Therapeutic Exercise: 8-22 mins        Greggory Stallion, PT, DPT [phone]   Sante Biedermann 11/02/2020, 1:01 PM

## 2020-11-02 NOTE — Plan of Care (Signed)

## 2020-11-02 NOTE — Progress Notes (Signed)
  Subjective: 2 Days Post-Op Procedure(s) (LRB): CANNULATED HIP PINNING (Left) Patient reports pain as mild.   Patient is well, and has had no acute complaints or problems Plan is to go home versus Rehab after hospital stay. Negative for chest pain and shortness of breath Fever: no Gastrointestinal: Negative for nausea and vomiting  Objective: Vital signs in last 24 hours: Temp:  [98.4 F (36.9 C)-99 F (37.2 C)] 98.4 F (36.9 C) (01/22 0536) Pulse Rate:  [61-81] 61 (01/22 0536) Resp:  [16-18] 16 (01/22 0536) BP: (113-157)/(47-77) 157/67 (01/22 0536) SpO2:  [94 %-98 %] 95 % (01/22 0536)  Intake/Output from previous day:  Intake/Output Summary (Last 24 hours) at 11/02/2020 0806 Last data filed at 11/01/2020 0930 Gross per 24 hour  Intake 480 ml  Output -  Net 480 ml    Intake/Output this shift: No intake/output data recorded.  Labs: Recent Labs    10/31/20 0529 10/31/20 1057 11/01/20 0346 11/01/20 1607 11/02/20 0554  HGB 10.6* 11.3* 10.0* 9.0* 8.4*   Recent Labs    11/01/20 1607 11/02/20 0554  WBC 16.6* 15.8*  RBC 3.12* 3.00*  HCT 26.8* 26.0*  PLT 11* 11*   Recent Labs    11/01/20 0346 11/02/20 0554  NA 140 140  K 4.3 4.2  CL 106 105  CO2 24 29  BUN 18 28*  CREATININE 0.81 0.97  GLUCOSE 131* 104*  CALCIUM 8.6* 8.8*   Recent Labs    10/30/20 0935  INR 1.1     EXAM General - Patient is Alert and Confused Extremity - Neurovascular intact Sensation intact distally Dorsiflexion/Plantar flexion intact Compartment soft Dressing/Incision - clean, dry, no drainage Motor Function - intact, moving foot and toes well on exam.   Past Medical History:  Diagnosis Date  . Alzheimer disease ([place])   . Anemia   . Anxiety   . Asthma    WELL CONTROLLED  . Chronic back pain   . Depression   . Depression   . GERD (gastroesophageal reflux disease)   . Hiatal hernia   . Hypercholesteremia   . Hypothyroidism   . ITP (idiopathic thrombocytopenic  purpura)    FOLLOWED BR DR Grayland Ormond  . LBBB (left bundle branch block)   . Osteoarthritis   . Osteoporosis   . Restless legs     Assessment/Plan: 2 Days Post-Op Procedure(s) (LRB): CANNULATED HIP PINNING (Left) Principal Problem:   Closed displaced fracture of left femoral neck (HCC) Active Problems:   Chronic ITP (idiopathic thrombocytopenic purpura) (HCC)   Acquired hypothyroidism   Depressive disorder   Benign essential hypertension   Late onset Alzheimer's disease without behavioral disturbance (HCC)   Idiopathic thrombocythemia (HCC)   Hiatal hernia   GERD (gastroesophageal reflux disease)  Estimated body mass index is 27.06 kg/m as calculated from the following:   Height as of this encounter: 5\' 2"  (1.575 m).   Weight as of this encounter: 67.1 kg. Advance diet Up with therapy  Platelet count 11 this morning.  Oncology and internal medicine are following.  Appreciated.  Discharge planning with follow-up to [facility] clinic in 2 weeks.  DVT Prophylaxis - Lovenox, Foot Pumps and TED hose Weight-Bearing as tolerated to left leg  Reche Dixon, PA-C Orthopaedic Surgery 11/02/2020, 8:06 AM

## 2020-11-02 NOTE — Progress Notes (Signed)
Care RN just received called from Lab reporting Critical Lab Value of Platelets at 9.  MD aware.  Currently infusing IGIV at 100 mL/hr.  Vital signs stable.

## 2020-11-02 NOTE — Progress Notes (Signed)
Ariana [name]   DOB:June 15, 1933   GG#:[id number]    Subjective: Patient sitting in the chair.  Denies any significant pain.  She denies any nosebleeds or gum bleeding.  Feels weak.  Objective:  Vitals:   11/02/20 1640 11/02/20 1727  BP: (!) 130/50 (!) 146/68  Pulse: 66 62  Resp: 18 18  Temp: 97.7 F (36.5 C) 98.1 F (36.7 C)  SpO2: 100% 99%     Intake/Output Summary (Last 24 hours) at 11/02/2020 1732 Last data filed at 11/02/2020 1446 Gross per 24 hour  Intake 0 ml  Output -  Net 0 ml    Physical Exam Constitutional:      Comments: Patient sitting in the chair.  No acute distress.  HENT:     Head: Normocephalic and atraumatic.     Mouth/Throat:     Mouth: Oropharynx is clear and moist.     Pharynx: No oropharyngeal exudate.  Eyes:     Pupils: Pupils are equal, round, and reactive to light.  Cardiovascular:     Rate and Rhythm: Normal rate and regular rhythm.  Pulmonary:     Effort: Pulmonary effort is normal. No respiratory distress.     Breath sounds: Normal breath sounds. No wheezing.  Abdominal:     General: Bowel sounds are normal. There is no distension.     Palpations: Abdomen is soft. There is no mass.     Tenderness: There is no abdominal tenderness. There is no guarding or rebound.  Musculoskeletal:        General: No tenderness or edema. Normal range of motion.     Cervical back: Normal range of motion and neck supple.  Skin:    General: Skin is warm.     Comments: Petechia noted in the mouth.  No active bleeding noted.  Also noted to have bruising on the face.  Neurological:     Mental Status: She is alert and oriented to person, place, and time.  Psychiatric:        Mood and Affect: Affect normal.      Labs:  Lab Results  Component Value Date   WBC 14.5 (H) 11/02/2020   HGB 8.8 (L) 11/02/2020   HCT 27.0 (L) 11/02/2020   MCV 87.7 11/02/2020   PLT 9 (LL) 11/02/2020   NEUTROABS 10.2 (H) 10/31/2020    Lab Results  Component Value  Date   NA 140 11/02/2020   K 4.2 11/02/2020   CL 105 11/02/2020   CO2 29 11/02/2020    Studies:  No results found.  Acute ITP [facility]) #75 old female patient with a history of chronic ITP currently admitted to hospital for acute fracture; with acute ITP.  #Acute on chronic ITP-platelets today 11; patient having spontaneous bruising on the face.  Patient given Nplate on 5/46. I would recommend starting the patient on IVIG 1 g/kg dose per day x2.  #Rib fracture postop day 1; hold DVT prophylaxis given low platelets.    Cammie Sickle, MD 11/02/2020  5:32 PM

## 2020-11-02 NOTE — Progress Notes (Signed)
[facility] Progress Note  Patient: Ariana [name]    RWE:[id number]  DOA: 10/30/2020     Date of Service: the patient was seen and examined on 11/02/2020  Chief Complaint  Patient presents with  . Fall   Brief hospital course: Ariana [name] is a 87 y.o. female with medical history significant for ITP, Alzheimer's dementia, depression, hypothyroidism, anxiety disorder and asthma who arrives to the ER via EMS for evaluation of left hip pain following a fall.  Patient was walking her dog and slipped on ice, landing on her left side.  She was unable to get up or bear weight on her left side and so EMS was called.  Patient with complaints of left shoulder, left hip and pain in the left side of the face. She denies any loss of consciousness or headache.  She denies having any chest pain, no shortness of breath, no nausea, no vomiting, no diarrhea, no abdominal pain, no urinary frequency, nocturia or dysuria.  No cough, no fever, no chills, no dizziness or lightheadedness. Labs show sodium 140, potassium 3.7, chloride 105, bicarb 25, glucose 109, BUN 20, creatinine 0.92, calcium 9.3, alk phos 90, albumin 3.7, AST 30, ALT 14, total protein 6.1, troponin 10, white count 13.5, hemoglobin 10.7, hematocrit 34.7, MCV 89, RDW 17, platelet count 32,, PT 13.3, INR 1.1 Respiratory viral panel is negative Chest x-ray reviewed by me shows no acute findings X-ray of the left hip shows cortical irregularity and slight angulation in the subcapital left femoral neck suspicious for nondisplaced subcapital left femoral neck fracture. No left hip dislocation. Consider left hip CT for further evaluation. CT scan of the pelvis without contrast shows LEFT femoral neck subcapital impaction fracture. Twelve-lead EKG reviewed by me shows sinus rhythm with PACs, left bundle branch block and left axis deviation.   ED Course: Patient is an 87 year old female with a history of ITP, dementia and  depression who presents to the ER for evaluation following a fall.  She has a left femoral neck fracture and will be admitted to the hospital for further evaluation.  Assessment and Plan: Principal Problem:   Closed displaced fracture of left femoral neck (HCC) Active Problems:   Chronic ITP (idiopathic thrombocytopenic purpura) (HCC)   Acquired hypothyroidism   Depressive disorder   Benign essential hypertension   Late onset Alzheimer's disease without behavioral disturbance (HCC)   Idiopathic thrombocythemia (HCC)   Hiatal hernia   GERD (gastroesophageal reflux disease)       Closed displaced fracture of left femoral neck Status post mechanical fall Pain control Continue fall precautions  Orthopedic surgery, s/p ORIF done on 10/31/2020  Follow PT and OT eval for placement Hold Lovenox until platelet count >>50,000 as per hematologist   Idiopathic thrombocytopenic purpura Chronic Patient has a platelet count of 32,000 with no evidence of bleeding  hematology consult appreciated Patient received one dose of Nplate plts droppe to 18 K, s/p 2 units of platelet transfusion before and during surgery given as per oncology 1/22 platelet count 11k, no transfusion until platelet count drops <<10,000 as per hematology   Hypertension, blood pressure noticed persistently elevated Patient does not take any medication at home Started amlodipine 5 mg p.o. daily We will continue to monitor BP and titrate medication accordingly.   Hypothyroidism Continue Synthroid   Alzheimer's dementia without behavioral disturbance Continue Aricept and Namenda   Depression and anxiety Continue as needed alprazolam Continue Wellbutrin and BuSpar    Hernia with GERD  Continue  Protonix    Body mass index is 27.06 kg/m.  Nutrition Problem: Increased nutrient needs Etiology: post-op healing Interventions: Interventions: MVI,Ensure Enlive (each supplement provides 350kcal  and 20 grams of protein)      Diet: Regular diet,  DVT Prophylaxis: SCD, pharmacological prophylaxis contraindicated due to Thrombocytopenia, risk of bleeding   Advance goals of care discussion: Full code  Family Communication: family was not present at bedside, at the time of interview.  The pt provided permission to discuss medical plan with the family. Opportunity was given to ask question and all questions were answered satisfactorily.   Disposition:  Pt is from Home, admitted with fall and left femur fracture, s/p ORIF done on 10/31/20 Awaiting for PT and OT eval and then SNF placement most likely vs HHPT  TOC following for placement  Subjective: No significant overnight events, patient was complaining of pain 7/10 at the moment and less pain at rest.  Patient denied any bruises or bleeding Denied any chest pain, no shortness of breath.  Denies any abdominal pain, no nausea vomiting or diarrhea.    Physical Exam: General:  alert oriented to time, place, and person.  Appear in mild distress, affect appropriate Eyes: PERRLA ENT: Oral Mucosa Clear, moist  Neck: no JVD,  Cardiovascular: S1 and S2 Present, no Murmur,  Respiratory: good respiratory effort, Bilateral Air entry equal and Decreased, no Crackles, no wheezes Abdomen: Bowel Sound present, Soft and no tenderness,  Skin: Multiple bruises upper and lower extremities  Extremities: no Pedal edema, no calf tenderness, bilateral chronic pigmentation.  Left hip tenderness, dressing CDI Neurologic: without any new focal findings Gait not checked due to patient safety concerns  Vitals:   11/01/20 2006 11/02/20 0029 11/02/20 0536 11/02/20 0828  BP: (!) 143/77 (!) 150/64 (!) 157/67 (!) 160/71  Pulse: 75 64 61 62  Resp: $Remo'17 17 16   'WmCTM$ Temp: 99 F (37.2 C) 98.4 F (36.9 C) 98.4 F (36.9 C) 97.9 F (36.6 C)  TempSrc: Oral Oral Oral   SpO2: 94% 96% 95% 100%  Weight:      Height:       No intake or output data in the 24 hours  ending 11/02/20 1344 Filed Weights   10/30/20 0846 10/31/20 1136  Weight: 67.1 kg 67.1 kg    Data Reviewed: I have personally reviewed and interpreted daily labs, tele strips, imagings as discussed above. I reviewed all nursing notes, pharmacy notes, vitals, pertinent old records I have discussed plan of care as described above with RN and patient/family.  CBC: Recent Labs  Lab 10/31/20 0529 10/31/20 1057 11/01/20 0346 11/01/20 1607 11/02/20 0554  WBC 12.2* 13.2* 12.5* 16.6* 15.8*  NEUTROABS  --  10.2*  --   --   --   HGB 10.6* 11.3* 10.0* 9.0* 8.4*  HCT 33.7* 35.1* 31.2* 26.8* 26.0*  MCV 88.0 86.9 88.1 85.9 86.7  PLT PLATELET CLUMPS NOTED ON SMEAR, UNABLE TO ESTIMATE 18* 16* 11* 11*   Basic Metabolic Panel: Recent Labs  Lab 10/30/20 0850 10/31/20 0529 11/01/20 0346 11/02/20 0554  NA 140 143 140 140  K 3.7 4.1 4.3 4.2  CL 105 107 106 105  CO2 $Re'25 30 24 29  'UWp$ GLUCOSE 109* 84 131* 104*  BUN $Re'20 17 18 'UKR$ 28*  CREATININE 0.92 0.83 0.81 0.97  CALCIUM 9.3 9.0 8.6* 8.8*    Studies: No results found.  Scheduled Meds: . acetaminophen  1,000 mg Oral Q8H  . amLODipine  5 mg Oral Daily  .  vitamin C  500 mg Oral Daily  . benzonatate  200 mg Oral TID  . buPROPion  150 mg Oral Daily  . busPIRone  15 mg Oral BID  . calcium-vitamin D  1 tablet Oral Daily  . cholecalciferol  2,000 Units Oral Daily  . citalopram  20 mg Oral BH-q7a  . docusate sodium  100 mg Oral BID  . donepezil  10 mg Oral BID  . feeding supplement  237 mL Oral BID BM  . gabapentin  300 mg Oral QID  . lamoTRIgine  50 mg Oral QHS  . levothyroxine  50 mcg Oral QAC breakfast  . memantine  10 mg Oral BID  . montelukast  10 mg Oral QHS  . multivitamin-lutein  1 capsule Oral BID  . oxybutynin  5 mg Oral TID  . pantoprazole  20 mg Oral BID  . pravastatin  20 mg Oral QHS  . senna  1 tablet Oral BID  . vitamin B-12  1,000 mcg Oral Daily  . vitamin E  400 Units Oral Daily   Continuous Infusions: . sodium chloride  75 mL/hr at 10/31/20 2256  . IMMUNE GLOBULIN 10% (HUMAN) IV - For Fluid Restriction Only    . methocarbamol (ROBAXIN) IV     PRN Meds: albuterol, ALPRAZolam, bisacodyl, carboxymethylcellul-glycerin, fluticasone, HYDROmorphone (DILAUDID) injection, methocarbamol **OR** methocarbamol (ROBAXIN) IV, metoCLOPramide **OR** metoCLOPramide (REGLAN) injection, ondansetron **OR** ondansetron (ZOFRAN) IV, oxyCODONE, oxyCODONE, senna-docusate, sodium phosphate, traMADol  Time spent: 35 minutes  Author: Val Riles. MD [facility] 11/02/2020 1:44 PM  To reach On-call, see care teams to locate the attending and reach out to them via [email or web address]. If 7PM-7AM, please contact night-coverage If you still have difficulty reaching the attending provider, please page the [facility] (Director on Call) for [facility] on amion for assistance.

## 2020-11-02 NOTE — Evaluation (Signed)
Occupational Therapy Evaluation Patient Details Name: Ariana [name] MRN: [id number] DOB: 04/26/1933 Today's Date: 11/02/2020    History of Present Illness Ariana [name] is a 87 y.o. female with medical history significant for ITP, Alzheimer's dementia, depression, hypothyroidism, anxiety disorder and asthma who arrives to the ER via EMS for evaluation of left hip pain following a fall.  Patient was walking her dog and slipped on ice, landing on her left side. S/p L ORIF on 10/31/20.   Clinical Impression   Ariana [name] was seen for OT evaluation this date. Prior to hospital admission, pt was Independent for mobility and ADLs. Pt lives c husband in mobile home c 3 STE. Pt presents to acute OT demonstrating impaired ADL performance and functional mobility 2/2 decreased activty tolerance, functional strength/balance deficits, and poor insight into deficits. Pt currently requires MIN A don B socks at bed level. MOD A + RW for BSC t/f, MIN A + RW standing perihygiene. Pt tolerated ~ 3 steps at EOB prior to L knee buckling and pt requested return to sitting. SBA seated grooming tasks - assist for impulsivity and poor safety awareness. Pt would benefit from skilled OT to address noted impairments and functional limitations (see below for any additional details) in order to maximize safety and independence while minimizing falls risk and caregiver burden. Upon hospital discharge, recommend STR to maximize pt safety and return to PLOF.     Follow Up Recommendations  SNF;Other (comment) (may progress)    Equipment Recommendations  Other (comment) (TBD)    Recommendations for Other Services       Precautions / Restrictions Precautions Precautions: Fall Restrictions Weight Bearing Restrictions: Yes LLE Weight Bearing: Weight bearing as tolerated      Mobility Bed Mobility Overal bed mobility: Needs Assistance Bed Mobility: Supine to Sit;Sit to Supine     Supine to sit:  Min assist;HOB elevated Sit to supine: Mod assist        Transfers Overall transfer level: Needs assistance Equipment used: Rolling walker (2 wheeled) Transfers: Sit to/from [facility] Sit to Stand: Mod assist Stand pivot transfers: Mod assist            Balance Overall balance assessment: Needs assistance Sitting-balance support: No upper extremity supported;Feet supported Sitting balance-Leahy Scale: Fair     Standing balance support: Single extremity supported;During functional activity Standing balance-Leahy Scale: Poor                             ADL either performed or assessed with clinical judgement   ADL Overall ADL's : Needs assistance/impaired                                       General ADL Comments: MIN A don B socks at bed level. MOD A + RW for BSC t/f, MIN A + RW standing perihygiene. SBA seated grooming tasks - assist for impulsivity and poor safety awareness     Vision Baseline Vision/History: Wears glasses Wears Glasses: At all times              Pertinent Vitals/Pain Pain Assessment: 0-10 Pain Score: 6  Pain Location: L hip Pain Descriptors / Indicators: Sore;Grimacing Pain Intervention(s): Limited activity within patient's tolerance;Repositioned     Hand Dominance Right   Extremity/Trunk Assessment Upper Extremity Assessment Upper Extremity Assessment: Generalized weakness   Lower  Extremity Assessment Lower Extremity Assessment: Generalized weakness       Communication Communication Communication: No difficulties   Cognition Arousal/Alertness: Awake/alert Behavior During Therapy: WFL for tasks assessed/performed;Impulsive Overall Cognitive Status: History of cognitive impairments - at baseline                                     General Comments       Exercises Exercises: Other exercises Other Exercises Other Exercises: Pt educated re: OT role, DME recs, d/c  recs, falls prevention, ECS, call bell Other Exercises: LBD, toileting, sup<>sit, sit<>stand, sitting/standing balance/tolerance   Shoulder Instructions      Home Living Family/patient expects to be discharged to:: Private residence Living Arrangements: Spouse/significant other Available Help at Discharge: Family;Available 24 hours/day Type of Home: Mobile home Home Access: Stairs to enter     Home Layout: One level     Bathroom Shower/Tub: Teacher, early years/pre: Handicapped height Bathroom Accessibility: Yes   Home Equipment: Cane - single point;Walker - 2 wheels   Additional Comments: Has SPC + walker at home, but does not use these.      Prior Functioning/Environment Level of Independence: Independent        Comments: Husband drives, assists with heavy IADLs. Independent ADLs        OT Problem List: Decreased strength;Decreased activity tolerance;Impaired balance (sitting and/or standing);Decreased range of motion;Impaired vision/perception;Decreased safety awareness      OT Treatment/Interventions: Self-care/ADL training;Therapeutic exercise;Energy conservation;DME and/or AE instruction;Therapeutic activities;Patient/family education;Balance training    OT Goals(Current goals can be found in the care plan section) Acute Rehab OT Goals Patient Stated Goal: To return home safely OT Goal Formulation: With patient Time For Goal Achievement: 11/16/20 Potential to Achieve Goals: Good ADL Goals Pt Will Perform Grooming: with modified independence;standing (c LRAD PRN) Pt Will Perform Lower Body Dressing: with modified independence;sit to/from stand (c LRAD PRN) Pt Will Transfer to Toilet: with modified independence;ambulating;regular height toilet (c LRAD PRN)  OT Frequency: Min 2X/week   Barriers to D/C: Inaccessible home environment             AM-PAC OT "6 Clicks" Daily Activity     Outcome Measure Help from another person eating meals?:  None Help from another person taking care of personal grooming?: A Little Help from another person toileting, which includes using toliet, bedpan, or urinal?: A Lot Help from another person bathing (including washing, rinsing, drying)?: A Lot Help from another person to put on and taking off regular upper body clothing?: A Little Help from another person to put on and taking off regular lower body clothing?: A Lot 6 Click Score: 16   End of Session Equipment Utilized During Treatment: Rolling walker Nurse Communication: Other (comment);Mobility status (bed alarm status)  Activity Tolerance: Patient tolerated treatment well Patient left: in bed;with call bell/phone within reach;with bed alarm set;Other (comment) (bed alarm not working - chair alarm on under rear in bed)  OT Visit Diagnosis: Other abnormalities of gait and mobility (R26.89);Muscle weakness (generalized) (M62.81)                Time: 2229-7989 OT Time Calculation (min): 24 min Charges:  OT General Charges $OT Visit: 1 Visit OT Evaluation $OT Eval Low Complexity: 1 Low OT Treatments $Self Care/Home Management : 23-37 mins   Dessie Coma, M.S. OTR/L  11/02/20, 10:27 AM  ascom [phone]

## 2020-11-03 DIAGNOSIS — S72002A Fracture of unspecified part of neck of left femur, initial encounter for closed fracture: Secondary | ICD-10-CM | POA: Diagnosis not present

## 2020-11-03 LAB — CBC
HCT: 25.6 % — ABNORMAL LOW (ref 36.0–46.0)
Hemoglobin: 8.2 g/dL — ABNORMAL LOW (ref 12.0–15.0)
MCH: 28.1 pg (ref 26.0–34.0)
MCHC: 32 g/dL (ref 30.0–36.0)
MCV: 87.7 fL (ref 80.0–100.0)
Platelets: 33 10*3/uL — ABNORMAL LOW (ref 150–400)
RBC: 2.92 MIL/uL — ABNORMAL LOW (ref 3.87–5.11)
RDW: 18.8 % — ABNORMAL HIGH (ref 11.5–15.5)
WBC: 10.7 10*3/uL — ABNORMAL HIGH (ref 4.0–10.5)
nRBC: 0 % (ref 0.0–0.2)

## 2020-11-03 LAB — PREPARE PLATELET PHERESIS: Unit division: 0

## 2020-11-03 LAB — MAGNESIUM: Magnesium: 1.8 mg/dL (ref 1.7–2.4)

## 2020-11-03 LAB — BPAM PLATELET PHERESIS
Blood Product Expiration Date: 202201242359
ISSUE DATE / TIME: 202201221829
Unit Type and Rh: 7300

## 2020-11-03 LAB — BASIC METABOLIC PANEL
Anion gap: 7 (ref 5–15)
BUN: 24 mg/dL — ABNORMAL HIGH (ref 8–23)
CO2: 30 mmol/L (ref 22–32)
Calcium: 8.6 mg/dL — ABNORMAL LOW (ref 8.9–10.3)
Chloride: 104 mmol/L (ref 98–111)
Creatinine, Ser: 0.8 mg/dL (ref 0.44–1.00)
GFR, Estimated: >60 mL/min (ref >60)
Glucose, Bld: 77 mg/dL (ref 70–99)
Potassium: 4 mmol/L (ref 3.5–5.1)
Sodium: 141 mmol/L (ref 135–145)

## 2020-11-03 LAB — PHOSPHORUS: Phosphorus: 3.4 mg/dL (ref 2.5–4.6)

## 2020-11-03 NOTE — Progress Notes (Signed)
Physical Therapy Treatment Patient Details Name: Ariana [name] MRN: [id number] DOB: May 28, 1933 Today's Date: 11/03/2020    History of Present Illness Ariana [name] is a 87 y.o. female with medical history significant for ITP, Alzheimer's dementia, depression, hypothyroidism, anxiety disorder and asthma who arrives to the ER via EMS for evaluation of left hip pain following a fall.  Patient was walking her dog and slipped on ice, landing on her left side. S/p L ORIF on 10/31/20.    PT Comments    Pt in recliner upon arrival. She is able to stand and walk to door and back with RW and min a x 1.  Generally unsteady with hands on assist at all times and cues.  Cognitive deficits are evident during session.  She is unsafe at this time to walk unassisted and falls would be likely without appropriate assist and attention.    SNF remains apropritate at this time given mobility deficits and cognition.     Follow Up Recommendations  SNF     Equipment Recommendations  None recommended by PT    Recommendations for Other Services       Precautions / Restrictions Precautions Precautions: Fall Restrictions Weight Bearing Restrictions: Yes LLE Weight Bearing: Weight bearing as tolerated    Mobility  Bed Mobility               General bed mobility comments: in chair before and after session  Transfers Overall transfer level: Needs assistance Equipment used: Rolling walker (2 wheeled) Transfers: Sit to/from Stand Sit to Stand: Min assist         General transfer comment: verbal cues for hand placements  Ambulation/Gait Ambulation/Gait assistance: Min assist Gait Distance (Feet): 30 Feet Assistive device: Rolling walker (2 wheeled) Gait Pattern/deviations: Step-to pattern Gait velocity: decreased   General Gait Details: to door and back limited by pain and fatigue, remains unsteady   Stairs             Wheelchair Mobility    Modified  Rankin (Stroke Patients Only)       Balance Overall balance assessment: Needs assistance Sitting-balance support: No upper extremity supported;Feet supported Sitting balance-Leahy Scale: Good     Standing balance support: Bilateral upper extremity supported Standing balance-Leahy Scale: Fair                              Cognition Arousal/Alertness: Awake/alert Behavior During Therapy: WFL for tasks assessed/performed;Impulsive Overall Cognitive Status: History of cognitive impairments - at baseline                                        Exercises Other Exercises Other Exercises: seated arom x 10 BLE    General Comments        Pertinent Vitals/Pain Pain Assessment: Faces Faces Pain Scale: Hurts little more Pain Location: L hip Pain Descriptors / Indicators: Sore;Grimacing Pain Intervention(s): Limited activity within patient's tolerance;Monitored during session;Repositioned    Home Living                      Prior Function            PT Goals (current goals can now be found in the care plan section) Progress towards PT goals: Progressing toward goals    Frequency    7X/week  PT Plan Current plan remains appropriate    Co-evaluation              AM-PAC PT "6 Clicks" Mobility   Outcome Measure  Help needed turning from your back to your side while in a flat bed without using bedrails?: A Little   Help needed moving to and from a bed to a chair (including a wheelchair)?: A Little Help needed standing up from a chair using your arms (e.g., wheelchair or bedside chair)?: A Little Help needed to walk in hospital room?: A Little Help needed climbing 3-5 steps with a railing? : A Lot 6 Click Score: 14    End of Session Equipment Utilized During Treatment: Gait belt Activity Tolerance: Patient tolerated treatment well Patient left: in chair;with chair alarm set;with call bell/phone within reach Nurse  Communication: Mobility status Pain - Right/Left: Left Pain - part of body: Hip     Time: 6803-2122 PT Time Calculation (min) (ACUTE ONLY): 11 min  Charges:  $Gait Training: 8-22 mins                    Chesley Noon, PTA 11/03/20, 9:23 AM

## 2020-11-03 NOTE — Progress Notes (Signed)
  Subjective: 3 Days Post-Op Procedure(s) (LRB): CANNULATED HIP PINNING (Left) Patient reports pain as mild.   Patient is well, and has had no acute complaints or problems Plan is to go home versus Rehab after hospital stay. Negative for chest pain and shortness of breath Fever: no Gastrointestinal: Negative for nausea and vomiting  Objective: Vital signs in last 24 hours: Temp:  [97.5 F (36.4 C)-98.1 F (36.7 C)] 98.1 F (36.7 C) (01/23 0808) Pulse Rate:  [56-74] 62 (01/23 0808) Resp:  [14-20] 16 (01/23 0808) BP: (129-162)/(48-72) 153/48 (01/23 0808) SpO2:  [95 %-100 %] 98 % (01/23 0808)  Intake/Output from previous day:  Intake/Output Summary (Last 24 hours) at 11/03/2020 0817 Last data filed at 11/03/2020 0351 Gross per 24 hour  Intake 808 ml  Output 400 ml  Net 408 ml    Intake/Output this shift: No intake/output data recorded.  Labs: Recent Labs    11/01/20 1607 11/02/20 0554 11/02/20 1558 11/02/20 2251 11/03/20 0544  HGB 9.0* 8.4* 8.8* 7.7* 8.2*   Recent Labs    11/02/20 2251 11/03/20 0544  WBC 15.1* 10.7*  RBC 2.75* 2.92*  HCT 24.0* 25.6*  PLT 27* 33*   Recent Labs    11/02/20 0554 11/03/20 0544  NA 140 141  K 4.2 4.0  CL 105 104  CO2 29 30  BUN 28* 24*  CREATININE 0.97 0.80  GLUCOSE 104* 77  CALCIUM 8.8* 8.6*   No results for input(s): LABPT, INR in the last 72 hours.   EXAM General - Patient is Alert and Confused Extremity - Neurovascular intact Sensation intact distally Dorsiflexion/Plantar flexion intact Compartment soft Dressing/Incision - clean, dry, no drainage Motor Function - intact, moving foot and toes well on exam.  Ambulated 10 feet with physical therapy.  Past Medical History:  Diagnosis Date  . Alzheimer disease ([place])   . Anemia   . Anxiety   . Asthma    WELL CONTROLLED  . Chronic back pain   . Depression   . Depression   . GERD (gastroesophageal reflux disease)   . Hiatal hernia   . Hypercholesteremia   .  Hypothyroidism   . ITP (idiopathic thrombocytopenic purpura)    FOLLOWED BR DR Grayland Ormond  . LBBB (left bundle branch block)   . Osteoarthritis   . Osteoporosis   . Restless legs     Assessment/Plan: 3 Days Post-Op Procedure(s) (LRB): CANNULATED HIP PINNING (Left) Principal Problem:   Closed displaced fracture of left femoral neck (HCC) Active Problems:   Acute ITP (HCC)   Acquired hypothyroidism   Depressive disorder   Benign essential hypertension   Late onset Alzheimer's disease without behavioral disturbance (HCC)   Idiopathic thrombocythemia (HCC)   Hiatal hernia   GERD (gastroesophageal reflux disease)  Estimated body mass index is 27.06 kg/m as calculated from the following:   Height as of this encounter: 5\' 2"  (1.575 m).   Weight as of this encounter: 67.1 kg. Advance diet Up with therapy  Platelet count 33 this morning.  Oncology and internal medicine are following.  Appreciated.  Discharge planning with follow-up to [facility] clinic in 2 weeks.  DVT Prophylaxis - Lovenox, Foot Pumps and TED hose Weight-Bearing as tolerated to left leg  Reche Dixon, PA-C Orthopaedic Surgery 11/03/2020, 8:17 AM

## 2020-11-03 NOTE — TOC Progression Note (Signed)
Transition of Care [facility]) - Progression Note    Patient Details  Name: Ariana [name] MRN: [id number] Date of Birth: 23-Jun-1933  Transition of Care [facility]) CM/SW Contact  Boris Sharper, LCSW Phone Number: 11/03/2020, 6:24 PM  Clinical Narrative:    CSW spoke with pt and spouse and they would like pt to go home with [facility].   Expected Discharge Plan: [place] Barriers to Discharge: Continued Medical Work up  Expected Discharge Plan and Services Expected Discharge Plan: [place] In-house Referral: Clinical Social Work     Living arrangements for the past 2 months: Single Family Home                                       Social Determinants of Health (SDOH) Interventions    Readmission Risk Interventions No flowsheet data found.

## 2020-11-03 NOTE — Progress Notes (Signed)
[facility] Progress Note  Patient: Ariana [name]    ERD:[id number]  DOA: 10/30/2020     Date of Service: the patient was seen and examined on 11/03/2020  Chief Complaint  Patient presents with  . Fall   Brief hospital course: Ariana [name] is a 87 y.o. female with medical history significant for ITP, Alzheimer's dementia, depression, hypothyroidism, anxiety disorder and asthma who arrives to the ER via EMS for evaluation of left hip pain following a fall.  Patient was walking her dog and slipped on ice, landing on her left side.  She was unable to get up or bear weight on her left side and so EMS was called.  Patient with complaints of left shoulder, left hip and pain in the left side of the face. She denies any loss of consciousness or headache.  She denies having any chest pain, no shortness of breath, no nausea, no vomiting, no diarrhea, no abdominal pain, no urinary frequency, nocturia or dysuria.  No cough, no fever, no chills, no dizziness or lightheadedness. Labs show sodium 140, potassium 3.7, chloride 105, bicarb 25, glucose 109, BUN 20, creatinine 0.92, calcium 9.3, alk phos 90, albumin 3.7, AST 30, ALT 14, total protein 6.1, troponin 10, white count 13.5, hemoglobin 10.7, hematocrit 34.7, MCV 89, RDW 17, platelet count 32,, PT 13.3, INR 1.1 Respiratory viral panel is negative Chest x-ray reviewed by me shows no acute findings X-ray of the left hip shows cortical irregularity and slight angulation in the subcapital left femoral neck suspicious for nondisplaced subcapital left femoral neck fracture. No left hip dislocation. Consider left hip CT for further evaluation. CT scan of the pelvis without contrast shows LEFT femoral neck subcapital impaction fracture. Twelve-lead EKG reviewed by me shows sinus rhythm with PACs, left bundle branch block and left axis deviation.   ED Course: Patient is an 87 year old female with a history of ITP, dementia and  depression who presents to the ER for evaluation following a fall.  She has a left femoral neck fracture and will be admitted to the hospital for further evaluation.  Assessment and Plan: Principal Problem:   Closed displaced fracture of left femoral neck (HCC) Active Problems:   Chronic ITP (idiopathic thrombocytopenic purpura) (HCC)   Acquired hypothyroidism   Depressive disorder   Benign essential hypertension   Late onset Alzheimer's disease without behavioral disturbance (HCC)   Idiopathic thrombocythemia (HCC)   Hiatal hernia   GERD (gastroesophageal reflux disease)       Closed displaced fracture of left femoral neck Status post mechanical fall Pain control Continue fall precautions  Orthopedic surgery, s/p ORIF done on 10/31/2020  Follow PT and OT eval for placement Hold Lovenox until platelet count >>50,000 as per hematologist   Idiopathic thrombocytopenic purpura Chronic Patient has a platelet count of 32,000 with no evidence of bleeding  hematology consult appreciated Patient received one dose of Nplate plts droppe to 18 K, s/p 2 units of platelet transfusion before and during surgery given as per oncology 1/22 platelet count 11k, no transfusion until platelet count drops <<10,000 as per hematology 1/22 s/p IVIG x 2 doses as per hematologist 1/23 plts 33K and Hb 8.2 stable   Hypertension, blood pressure noticed persistently elevated Patient does not take any medication at home Started amlodipine 5 mg p.o. daily We will continue to monitor BP and titrate medication accordingly.   Hypothyroidism Continue Synthroid   Alzheimer's dementia without behavioral disturbance Continue Aricept and Namenda   Depression and anxiety  Continue as needed alprazolam Continue Wellbutrin and BuSpar    Hernia with GERD  Continue Protonix    Body mass index is 27.06 kg/m.  Nutrition Problem: Increased nutrient needs Etiology: post-op  healing Interventions: Interventions: MVI,Ensure Enlive (each supplement provides 350kcal and 20 grams of protein)      Diet: Regular diet,  DVT Prophylaxis: SCD, pharmacological prophylaxis contraindicated due to Thrombocytopenia, risk of bleeding   Advance goals of care discussion: Full code  Family Communication: family was not present at bedside, at the time of interview.  The pt provided permission to discuss medical plan with the family. Opportunity was given to ask question and all questions were answered satisfactorily.   Disposition:  Pt is from Home, admitted with fall and left femur fracture, s/p ORIF done on 10/31/20 PT/OT eval rec SNF placement but pt is refusing and would like to go home. TOC following for placement, will make final decision tomorrow and monitor Plts count as well.  Subjective: No significant overnight events, patient feels improvement in the pain, c/o 5/10 pain at rest, feels improvement after taking p.o. medications.   Patient denies any new bruises, no bleeding   Physical Exam: General:  alert oriented to time, place, and person.  Appear in mild distress, affect appropriate Eyes: PERRLA ENT: Oral Mucosa Clear, moist, left frontal bruise since 1/22 Neck: no JVD,  Cardiovascular: S1 and S2 Present, no Murmur,  Respiratory: good respiratory effort, Bilateral Air entry equal and Decreased, no Crackles, no wheezes Abdomen: Bowel Sound present, Soft and no tenderness,  Skin: Multiple bruises upper and lower extremities  Extremities: no Pedal edema, no calf tenderness, bilateral chronic pigmentation.  Left hip tenderness, dressing CDI Neurologic: without any new focal findings Gait not checked due to patient safety concerns  Vitals:   11/02/20 2012 11/02/20 2343 11/03/20 0340 11/03/20 0808  BP: 129/70 (!) 137/55 135/63 (!) 153/48  Pulse: 74 67 (!) 59 62  Resp: $Remo'16 20 19 16  'XrFvc$ Temp: 97.9 F (36.6 C) 97.7 F (36.5 C) 97.7 F (36.5 C) 98.1 F (36.7 C)   TempSrc: Oral     SpO2: 100% 95% 96% 98%  Weight:      Height:        Intake/Output Summary (Last 24 hours) at 11/03/2020 1127 Last data filed at 11/03/2020 0351 Gross per 24 hour  Intake 808 ml  Output 400 ml  Net 408 ml   Filed Weights   10/30/20 0846 10/31/20 1136  Weight: 67.1 kg 67.1 kg    Data Reviewed: I have personally reviewed and interpreted daily labs, tele strips, imagings as discussed above. I reviewed all nursing notes, pharmacy notes, vitals, pertinent old records I have discussed plan of care as described above with RN and patient/family.  CBC: Recent Labs  Lab 10/31/20 1057 11/01/20 0346 11/01/20 1607 11/02/20 0554 11/02/20 1558 11/02/20 2251 11/03/20 0544  WBC 13.2*   < > 16.6* 15.8* 14.5* 15.1* 10.7*  NEUTROABS 10.2*  --   --   --   --   --   --   HGB 11.3*   < > 9.0* 8.4* 8.8* 7.7* 8.2*  HCT 35.1*   < > 26.8* 26.0* 27.0* 24.0* 25.6*  MCV 86.9   < > 85.9 86.7 87.7 87.3 87.7  PLT 18*   < > 11* 11* 9* 27* 33*   < > = values in this interval not displayed.   Basic Metabolic Panel: Recent Labs  Lab 10/30/20 0850 10/31/20 0529 11/01/20 0346 11/02/20  0554 11/03/20 0544  NA 140 143 140 140 141  K 3.7 4.1 4.3 4.2 4.0  CL 105 107 106 105 104  CO2 $Re'25 30 24 29 30  'PqV$ GLUCOSE 109* 84 131* 104* 77  BUN $Re'20 17 18 'tmz$ 28* 24*  CREATININE 0.92 0.83 0.81 0.97 0.80  CALCIUM 9.3 9.0 8.6* 8.8* 8.6*  MG  --   --   --   --  1.8  PHOS  --   --   --   --  3.4    Studies: No results found.  Scheduled Meds: . acetaminophen  1,000 mg Oral Q8H  . amLODipine  5 mg Oral Daily  . vitamin C  500 mg Oral Daily  . benzonatate  200 mg Oral TID  . buPROPion  150 mg Oral Daily  . busPIRone  15 mg Oral BID  . calcium-vitamin D  1 tablet Oral Daily  . cholecalciferol  2,000 Units Oral Daily  . citalopram  20 mg Oral BH-q7a  . docusate sodium  100 mg Oral BID  . donepezil  10 mg Oral BID  . feeding supplement  237 mL Oral BID BM  . gabapentin  300 mg Oral QID  .  lamoTRIgine  50 mg Oral QHS  . levothyroxine  50 mcg Oral QAC breakfast  . memantine  10 mg Oral BID  . montelukast  10 mg Oral QHS  . multivitamin-lutein  1 capsule Oral BID  . oxybutynin  5 mg Oral TID  . pantoprazole  20 mg Oral BID  . pravastatin  20 mg Oral QHS  . senna  1 tablet Oral BID  . vitamin B-12  1,000 mcg Oral Daily  . vitamin E  400 Units Oral Daily   Continuous Infusions: . sodium chloride 75 mL/hr at 10/31/20 2256  . IMMUNE GLOBULIN 10% (HUMAN) IV - For Fluid Restriction Only 240 mL/hr at 11/02/20 1649  . methocarbamol (ROBAXIN) IV     PRN Meds: albuterol, ALPRAZolam, bisacodyl, carboxymethylcellul-glycerin, fluticasone, HYDROmorphone (DILAUDID) injection, methocarbamol **OR** methocarbamol (ROBAXIN) IV, metoCLOPramide **OR** metoCLOPramide (REGLAN) injection, ondansetron **OR** ondansetron (ZOFRAN) IV, oxyCODONE, oxyCODONE, senna-docusate, sodium phosphate, traMADol  Time spent: 35 minutes  Author: Val Riles. MD [facility] 11/03/2020 11:27 AM  To reach On-call, see care teams to locate the attending and reach out to them via [email or web address]. If 7PM-7AM, please contact night-coverage If you still have difficulty reaching the attending provider, please page the [facility] (Director on Call) for [facility] on amion for assistance.

## 2020-11-04 ENCOUNTER — Inpatient Hospital Stay: Payer: Medicare HMO

## 2020-11-04 DIAGNOSIS — S72002A Fracture of unspecified part of neck of left femur, initial encounter for closed fracture: Secondary | ICD-10-CM | POA: Diagnosis not present

## 2020-11-04 LAB — GLUCOSE, CAPILLARY: Glucose-Capillary: 74 mg/dL (ref 70–99)

## 2020-11-04 LAB — BASIC METABOLIC PANEL
Anion gap: 8 (ref 5–15)
BUN: 20 mg/dL (ref 8–23)
CO2: 28 mmol/L (ref 22–32)
Calcium: 9 mg/dL (ref 8.9–10.3)
Chloride: 98 mmol/L (ref 98–111)
Creatinine, Ser: 0.75 mg/dL (ref 0.44–1.00)
GFR, Estimated: >60 mL/min (ref >60)
Glucose, Bld: 85 mg/dL (ref 70–99)
Potassium: 3.8 mmol/L (ref 3.5–5.1)
Sodium: 134 mmol/L — ABNORMAL LOW (ref 135–145)

## 2020-11-04 LAB — CBC
HCT: 26.5 % — ABNORMAL LOW (ref 36.0–46.0)
Hemoglobin: 8.9 g/dL — ABNORMAL LOW (ref 12.0–15.0)
MCH: 28.4 pg (ref 26.0–34.0)
MCHC: 33.6 g/dL (ref 30.0–36.0)
MCV: 84.7 fL (ref 80.0–100.0)
Platelets: 105 10*3/uL — ABNORMAL LOW (ref 150–400)
RBC: 3.13 MIL/uL — ABNORMAL LOW (ref 3.87–5.11)
RDW: 18.6 % — ABNORMAL HIGH (ref 11.5–15.5)
WBC: 11.4 10*3/uL — ABNORMAL HIGH (ref 4.0–10.5)
nRBC: 0.2 % (ref 0.0–0.2)

## 2020-11-04 LAB — PHOSPHORUS: Phosphorus: 4 mg/dL (ref 2.5–4.6)

## 2020-11-04 LAB — MAGNESIUM: Magnesium: 1.7 mg/dL (ref 1.7–2.4)

## 2020-11-04 MED ORDER — MELATONIN 3 MG PO TABS
6.0000 mg | ORAL_TABLET | Freq: Every evening | ORAL | Status: DC
Start: 2020-11-04 — End: 2020-11-07
  Administered 2020-11-04 – 2020-11-06 (×3): 6 mg via ORAL
  Filled 2020-11-04 (×6): qty 2

## 2020-11-04 MED ORDER — MAGNESIUM OXIDE 400 (241.3 MG) MG PO TABS
400.0000 mg | ORAL_TABLET | Freq: Two times a day (BID) | ORAL | Status: AC
  Administered 2020-11-04 – 2020-11-06 (×6): 400 mg via ORAL
  Filled 2020-11-04 (×6): qty 1

## 2020-11-04 MED ORDER — MUPIROCIN 2 % EX OINT
TOPICAL_OINTMENT | Freq: Two times a day (BID) | CUTANEOUS | Status: DC
  Administered 2020-11-06 – 2020-11-09 (×2): 1 via NASAL
  Filled 2020-11-04: qty 22

## 2020-11-04 MED ORDER — ENOXAPARIN SODIUM 40 MG/0.4ML ~~LOC~~ SOLN
40.0000 mg | Freq: Every evening | SUBCUTANEOUS | Status: DC
  Administered 2020-11-04 – 2020-11-11 (×7): 40 mg via SUBCUTANEOUS
  Filled 2020-11-04 (×8): qty 0.4

## 2020-11-04 MED ORDER — KETOROLAC TROMETHAMINE 15 MG/ML IJ SOLN
7.5000 mg | Freq: Once | INTRAMUSCULAR | Status: AC
  Administered 2020-11-04: 7.5 mg via INTRAVENOUS
  Filled 2020-11-04: qty 1

## 2020-11-04 MED ORDER — CHLORHEXIDINE GLUCONATE CLOTH 2 % EX PADS
6.0000 | MEDICATED_PAD | Freq: Every day | CUTANEOUS | Status: DC
  Administered 2020-11-04 – 2020-11-12 (×8): 6 via TOPICAL

## 2020-11-04 NOTE — TOC Progression Note (Signed)
Transition of Care [facility]) - Progression Note    Patient Details  Name: Ariana [name] MRN: [id number] Date of Birth: 1933-03-06  Transition of Care [facility]) CM/SW Rake, RN Phone Number: 11/04/2020, 3:11 PM  Clinical Narrative:   RNCM received call from April Baysden who reports to be patient's neighbor and POA. April is calling not looking for information but reports that she believes at this time the plan for patient to come home is not a safe one. She reports that patient's husband is not able to care for her in his current state and she thinks that patient would do much better if she went to a facility. Discussed with April that at this time there is no documentation available in the chart showing that she is POA and that this is something that would need to be brought to the hospital. April verbalized understanding and reported that she would work on getting it to the hospital.     Expected Discharge Plan: Skilled Nursing Facility Barriers to Discharge: Continued Medical Work up  Expected Discharge Plan and Services Expected Discharge Plan: [place] In-house Referral: Clinical Social Work     Living arrangements for the past 2 months: Single Family Home                                       Social Determinants of Health (SDOH) Interventions    Readmission Risk Interventions No flowsheet data found.

## 2020-11-04 NOTE — Care Management Important Message (Signed)
Important Message  Patient Details  Name: Ariana [name] MRN: [id number] Date of Birth: [birth year]   Medicare Important Message Given:  Yes     Juliann Pulse A Kharter Sestak 11/04/2020, 12:27 PM

## 2020-11-04 NOTE — Progress Notes (Signed)
[facility] Progress Note  Patient: Ariana [name]    MBW:[id number]  DOA: 10/30/2020     Date of Service: the patient was seen and examined on 11/04/2020  Chief Complaint  Patient presents with  . Fall   Brief hospital course: Samanatha Brammer is a 87 y.o. female with medical history significant for ITP, Alzheimer's dementia, depression, hypothyroidism, anxiety disorder and asthma who arrives to the ER via EMS for evaluation of left hip pain following a fall.  Patient was walking her dog and slipped on ice, landing on her left side.  She was unable to get up or bear weight on her left side and so EMS was called.  Patient with complaints of left shoulder, left hip and pain in the left side of the face. She denies any loss of consciousness or headache.  She denies having any chest pain, no shortness of breath, no nausea, no vomiting, no diarrhea, no abdominal pain, no urinary frequency, nocturia or dysuria.  No cough, no fever, no chills, no dizziness or lightheadedness. Labs show sodium 140, potassium 3.7, chloride 105, bicarb 25, glucose 109, BUN 20, creatinine 0.92, calcium 9.3, alk phos 90, albumin 3.7, AST 30, ALT 14, total protein 6.1, troponin 10, white count 13.5, hemoglobin 10.7, hematocrit 34.7, MCV 89, RDW 17, platelet count 32,, PT 13.3, INR 1.1 Respiratory viral panel is negative Chest x-ray reviewed by me shows no acute findings X-ray of the left hip shows cortical irregularity and slight angulation in the subcapital left femoral neck suspicious for nondisplaced subcapital left femoral neck fracture. No left hip dislocation. Consider left hip CT for further evaluation. CT scan of the pelvis without contrast shows LEFT femoral neck subcapital impaction fracture. Twelve-lead EKG reviewed by me shows sinus rhythm with PACs, left bundle branch block and left axis deviation.   ED Course: Patient is an 87 year old female with a history of ITP, dementia and  depression who presents to the ER for evaluation following a fall.  She has a left femoral neck fracture and will be admitted to the hospital for further evaluation.  Assessment and Plan: Principal Problem:   Closed displaced fracture of left femoral neck (HCC) Active Problems:   Chronic ITP (idiopathic thrombocytopenic purpura) (HCC)   Acquired hypothyroidism   Depressive disorder   Benign essential hypertension   Late onset Alzheimer's disease without behavioral disturbance (HCC)   Idiopathic thrombocythemia (HCC)   Hiatal hernia   GERD (gastroesophageal reflux disease)       Closed displaced fracture of left femoral neck Status post mechanical fall Pain control Continue fall precautions  Orthopedic surgery, s/p ORIF done on 10/31/2020  Follow PT and OT eval for placement Hold Lovenox until platelet count >>50,000 as per hematologist 1/24 resumed Lovenox   Idiopathic thrombocytopenic purpura Chronic Patient presented with platelet count of 32,000 with no evidence of bleeding  hematology consult appreciated Patient received one dose of Nplate plts droppe to 18 K, s/p 2 units of platelet transfusion before and during surgery given as per oncology 1/22 platelet count 11k, no transfusion until platelet count drops <<10,000 as per hematology 1/22 s/p IVIG x 2 doses as per hematologist 1/23 plts 33K and Hb 8.2 stable 1/24 Plts 105 K  Hypertension, blood pressure noticed persistently elevated Patient does not take any medication at home Started amlodipine 5 mg p.o. daily We will continue to monitor BP and titrate medication accordingly.   Hypothyroidism Continue Synthroid   Alzheimer's dementia without behavioral disturbance Continue Aricept and  Namenda   Depression and anxiety Continue as needed alprazolam Continue Wellbutrin and BuSpar    Hernia with GERD  Continue Protonix  1/24 c/o headache, did not sleep well last night CT head ruled out any  acute bleeding versus stroke Patient was advised to continue Tylenol, 1 dose IV Toradol given Added melatonin   Body mass index is 27.06 kg/m.  Nutrition Problem: Increased nutrient needs Etiology: post-op healing Interventions: Interventions: MVI,Ensure Enlive (each supplement provides 350kcal and 20 grams of protein)      Diet: Regular diet,  DVT Prophylaxis: SCD, pharmacological prophylaxis contraindicated due to Thrombocytopenia, risk of bleeding   Advance goals of care discussion: Full code  Family Communication: family was not present at bedside, at the time of interview.  The pt provided permission to discuss medical plan with the family. Opportunity was given to ask question and all questions were answered satisfactorily.   Disposition:  Pt is from Home, admitted with fall and left femur fracture, s/p ORIF done on 10/31/20 PT/OT eval rec SNF placement but pt is refusing and would like to go home. TOC following for placement, will make final decision tomorrow and monitor Plts count as well. Discussed with patient's husband to watch her activity today to see if he is able to take care of her at home or not and then we can proceed for SNF placement if needed   Subjective: No significant overnight events, patient was complaining of headache in the morning, hip pain is under control now.  Did not sleep well.  Denied any chest pain or palpitations, no shortness of breath.   Physical Exam: General:  alert oriented to time, place, and person.  Appear in mild distress, affect appropriate Eyes: PERRLA ENT: Oral Mucosa Clear, moist, left frontal bruise since 1/22 Neck: no JVD,  Cardiovascular: S1 and S2 Present, no Murmur,  Respiratory: good respiratory effort, Bilateral Air entry equal and Decreased, no Crackles, no wheezes Abdomen: Bowel Sound present, Soft and no tenderness,  Skin: Multiple bruises upper and lower extremities  Extremities: no Pedal edema, no calf tenderness,  bilateral chronic pigmentation.  Left hip tenderness, dressing CDI Neurologic: without any new focal findings Gait not checked due to patient safety concerns  Vitals:   11/04/20 0154 11/04/20 0527 11/04/20 0810 11/04/20 1145  BP: (!) 160/71 (!) 151/69 (!) 153/72 (!) 137/58  Pulse: 76 77 70 71  Resp: $Remo'18 18 16 17  'VDOaV$ Temp: 98.2 F (36.8 C) 98.5 F (36.9 C) 98.5 F (36.9 C) 97.8 F (36.6 C)  TempSrc: Oral Oral Oral   SpO2: 94% 98% 96% 97%  Weight:      Height:        Intake/Output Summary (Last 24 hours) at 11/04/2020 1416 Last data filed at 11/04/2020 0315 Gross per 24 hour  Intake 50 ml  Output 300 ml  Net -250 ml   Filed Weights   10/30/20 0846 10/31/20 1136  Weight: 67.1 kg 67.1 kg    Data Reviewed: I have personally reviewed and interpreted daily labs, tele strips, imagings as discussed above. I reviewed all nursing notes, pharmacy notes, vitals, pertinent old records I have discussed plan of care as described above with RN and patient/family.  CBC: Recent Labs  Lab 10/31/20 1057 11/01/20 0346 11/02/20 0554 11/02/20 1558 11/02/20 2251 11/03/20 0544 11/04/20 0325  WBC 13.2*   < > 15.8* 14.5* 15.1* 10.7* 11.4*  NEUTROABS 10.2*  --   --   --   --   --   --  HGB 11.3*   < > 8.4* 8.8* 7.7* 8.2* 8.9*  HCT 35.1*   < > 26.0* 27.0* 24.0* 25.6* 26.5*  MCV 86.9   < > 86.7 87.7 87.3 87.7 84.7  PLT 18*   < > 11* 9* 27* 33* 105*   < > = values in this interval not displayed.   Basic Metabolic Panel: Recent Labs  Lab 10/31/20 0529 11/01/20 0346 11/02/20 0554 11/03/20 0544 11/04/20 0325  NA 143 140 140 141 134*  K 4.1 4.3 4.2 4.0 3.8  CL 107 106 105 104 98  CO2 $Re'30 24 29 30 28  'epI$ GLUCOSE 84 131* 104* 77 85  BUN 17 18 28* 24* 20  CREATININE 0.83 0.81 0.97 0.80 0.75  CALCIUM 9.0 8.6* 8.8* 8.6* 9.0  MG  --   --   --  1.8 1.7  PHOS  --   --   --  3.4 4.0    Studies: CT HEAD WO CONTRAST  Result Date: 11/04/2020 CLINICAL DATA:  Headache. Intracranial hemorrhage  suspected. History of hip surgery. EXAM: CT HEAD WITHOUT CONTRAST TECHNIQUE: Contiguous axial images were obtained from the base of the skull through the vertex without intravenous contrast. COMPARISON:  03/18/2020 FINDINGS: Brain: Mild cerebral atrophy is unchanged. Again noted is diffuse low-density in the periventricular and subcortical white matter. White matter changes are stable. Negative for acute hemorrhage, mass lesion, midline shift, hydrocephalus or large infarct. Vascular: No hyperdense vessel or unexpected calcification. Skull: Normal. Negative for fracture or focal lesion. Sinuses/Orbits: Small amount of fluid in the posterior right mastoid air cells. Minimal mucosal disease in the right anterior ethmoid air cells. Other: None IMPRESSION: 1. No acute intracranial abnormality. 2. Stable white matter changes that likely represent chronic small vessel ischemic disease. 3. Minimal sinus disease. Electronically Signed   By: Markus Daft M.D.   On: 11/04/2020 09:44    Scheduled Meds: . acetaminophen  1,000 mg Oral Q8H  . amLODipine  5 mg Oral Daily  . vitamin C  500 mg Oral Daily  . benzonatate  200 mg Oral TID  . buPROPion  150 mg Oral Daily  . busPIRone  15 mg Oral BID  . calcium-vitamin D  1 tablet Oral Daily  . Chlorhexidine Gluconate Cloth  6 each Topical Q0600  . cholecalciferol  2,000 Units Oral Daily  . citalopram  20 mg Oral BH-q7a  . docusate sodium  100 mg Oral BID  . donepezil  10 mg Oral BID  . enoxaparin (LOVENOX) injection  40 mg Subcutaneous QPM  . feeding supplement  237 mL Oral BID BM  . gabapentin  300 mg Oral QID  . lamoTRIgine  50 mg Oral QHS  . levothyroxine  50 mcg Oral QAC breakfast  . magnesium oxide  400 mg Oral BID  . melatonin  6 mg Oral QPM  . memantine  10 mg Oral BID  . montelukast  10 mg Oral QHS  . multivitamin-lutein  1 capsule Oral BID  . mupirocin ointment   Nasal BID  . oxybutynin  5 mg Oral TID  . pantoprazole  20 mg Oral BID  . pravastatin  20  mg Oral QHS  . senna  1 tablet Oral BID  . vitamin B-12  1,000 mcg Oral Daily  . vitamin E  400 Units Oral Daily   Continuous Infusions: . sodium chloride Stopped (11/04/20 0315)  . methocarbamol (ROBAXIN) IV     PRN Meds: albuterol, ALPRAZolam, bisacodyl, carboxymethylcellul-glycerin, fluticasone, HYDROmorphone (DILAUDID) injection,  methocarbamol **OR** methocarbamol (ROBAXIN) IV, metoCLOPramide **OR** metoCLOPramide (REGLAN) injection, ondansetron **OR** ondansetron (ZOFRAN) IV, oxyCODONE, oxyCODONE, senna-docusate, sodium phosphate, traMADol  Time spent: 35 minutes  Author: Val Riles. MD [facility] 11/04/2020 2:16 PM  To reach On-call, see care teams to locate the attending and reach out to them via [email or web address]. If 7PM-7AM, please contact night-coverage If you still have difficulty reaching the attending provider, please page the [facility] (Director on Call) for [facility] on amion for assistance.

## 2020-11-04 NOTE — Progress Notes (Signed)
OT Cancellation Note  Patient Details Name: Ariana [name] MRN: [id number] DOB: 03-15-1933   Cancelled Treatment:    Reason Eval/Treat Not Completed: Medical issues which prohibited therapy   Per chart and PT report, pt is experiencing new 10/10 headache and is awaiting CT scan this morning.  Will monitor and follow up at next opportunity as pt is appropriate for therapeutic activity.  Myrtie Hawk Tiffannie Sloss, OTR/L 11/04/20, 9:59 AM

## 2020-11-04 NOTE — Progress Notes (Signed)
Occupational Therapy Treatment Patient Details Name: Ariana [name] MRN: [id number] DOB: 1933-05-08 Today's Date: 11/04/2020    History of present illness Ariana [name] is a 87 y.o. female with medical history significant for ITP, Alzheimer's dementia, depression, hypothyroidism, anxiety disorder and asthma who arrives to the ER via EMS for evaluation of left hip pain following a fall.  Patient was walking her dog and slipped on ice, landing on her left side. S/p L ORIF on 10/31/20.   OT comments  Ariana [name] presents to OT with impaired cognition, agitation/pain, and generalized weakness that impacts her ability to safely and independently complete functional tasks.  Pt was disoriented to situation/date/self, but was oriented to setting.  She was perseverative on her pain and wanting to discharge home.  Pt endorsed 10/10 pain and her husband requested stronger pain medications, of which RN provided during OT session.  OTR provided education re: relaxation strategies for pain and pt was agreeable to completing grooming tasks at bedlevel to assist in relaxation.  OTR provided setup assist for pt to wash face and brush teeth at bedlevel, of which pt was less agitated and less perseverative while engaged in these tasks.  Pt declined further ADL practice or OOB mobility due to pain.  OTR provided education re: functional cognition including re-orientation and compensatory strategies for decreased short term memory.  Pt's husband requested OTR educate pt on how to order food from the menu, of which OTR provided education and strategies to improve engagement/independence in task.  Pt's husband reports she has not been receiving food, and requested OTR order tray while therapist was in the room.  OTR confirmed with dining that pt had received a tray for lunch, but pt's husband requested OTR order dinner tray during session.  Ariana [name] will continue to benefit from skilled OT services in  acute setting to address cognition, pain management, and safety and independence in ADLs.  SNF remains most appropriate discharge recommendation due to pt's impaired cognition, poor safety awareness, pain, and poor balance/strength in ADLs.     Follow Up Recommendations  SNF    Equipment Recommendations  3 in 1 bedside commode;Tub/shower bench    Recommendations for Other Services      Precautions / Restrictions Precautions Precautions: Fall Restrictions Weight Bearing Restrictions: Yes LLE Weight Bearing: Weight bearing as tolerated       Mobility Bed Mobility Overal bed mobility: Needs Assistance             General bed mobility comments: pt declined during session 2/2 pain  Transfers                      Balance Overall balance assessment: Needs assistance                                         ADL either performed or assessed with clinical judgement   ADL Overall ADL's : Needs assistance/impaired                                       General ADL Comments: OTR provided setup assist for pt to wash face and brush teeth at bedlevel, pt declined further ADL practice or mobility 2/2 pain     Vision Baseline Vision/History: Wears glasses Wears Glasses: At all times  Perception     Praxis      Cognition Arousal/Alertness: Awake/alert Behavior During Therapy: Restless Overall Cognitive Status: History of cognitive impairments - at baseline                                 General Comments: pt with decreased short term memory, disoriented to situation/self/date (oriented to setting), perseverative on pain and wanting to go home        Exercises Other Exercises Other Exercises: provided setup assist for grooming tasks at bedlevel, education re: functional cognition and pain management   Shoulder Instructions       General Comments      Pertinent Vitals/ Pain       Pain Assessment: 0-10 Pain  Score: 10-Worst pain ever Pain Location: Pt endorses 10/10 pain, states the pain is in her head and all over her body Pain Intervention(s): Limited activity within patient's tolerance;Monitored during session;Patient requesting pain meds-RN notified;RN gave pain meds during session (pt's husband requested pain medications during session, of which RN provided)  Home Living                                          Prior Functioning/Environment              Frequency  Min 2X/week        Progress Toward Goals  OT Goals(current goals can now be found in the care plan section)  Progress towards OT goals: Progressing toward goals  Acute Rehab OT Goals Patient Stated Goal: To return home safely OT Goal Formulation: With patient Time For Goal Achievement: 11/16/20 Potential to Achieve Goals: Good  Plan Discharge plan remains appropriate;Frequency remains appropriate    Co-evaluation                 AM-PAC OT "6 Clicks" Daily Activity     Outcome Measure   Help from another person eating meals?: None Help from another person taking care of personal grooming?: A Little Help from another person toileting, which includes using toliet, bedpan, or urinal?: A Lot Help from another person bathing (including washing, rinsing, drying)?: A Lot Help from another person to put on and taking off regular upper body clothing?: A Little Help from another person to put on and taking off regular lower body clothing?: A Lot 6 Click Score: 16    End of Session    OT Visit Diagnosis: Other abnormalities of gait and mobility (R26.89);Muscle weakness (generalized) (M62.81)   Activity Tolerance Patient limited by pain;Treatment limited secondary to agitation   Patient Left in bed;with call bell/phone within reach;with bed alarm set;with family/visitor present   Nurse Communication Patient requests pain meds        Time: 4098-1191 OT Time Calculation (min): 31  min  Charges: OT General Charges $OT Visit: 1 Visit OT Treatments $Self Care/Home Management : 23-37 mins  Myrtie Hawk Hang Ammon, OTR/L 11/04/20, 4:25 PM

## 2020-11-04 NOTE — Progress Notes (Signed)
PT Cancellation Note  Patient Details Name: Ariana [name] MRN: [id number] DOB: Jun 20, 1933   Cancelled Treatment:    Reason Eval/Treat Not Completed: Medical issues which prohibited therapy   Pt in bed, c/o headache.  Awaiting transport to CT.  Will monitor and continue as appropriate later today.   Chesley Noon 11/04/2020, 9:21 AM

## 2020-11-04 NOTE — Progress Notes (Signed)
PT Cancellation Note  Patient Details Name: Ariana [name] MRN: [id number] DOB: 1933/02/09   Cancelled Treatment:    Reason Eval/Treat Not Completed: Other (comment)   Session offered this pm.  Dinner try arrived as I did.  She declined activity at this time despite improvement in headache.  Aware of discharge planning.  Pt remains appropriate for SNF and is a high fall risk if she returns home with independent ambulation.  Will need +1 assist at all times if she chooses to go home.    Chesley Noon 11/04/2020, 3:56 PM

## 2020-11-04 NOTE — Progress Notes (Signed)
  Subjective: 4 Days Post-Op Procedure(s) (LRB): CANNULATED HIP PINNING (Left) Patient reports pain as mild.   Patient is well, and has had no acute complaints or problems Plan is to go home versus Rehab after hospital stay. Negative for chest pain and shortness of breath Fever: no Gastrointestinal: Negative for nausea and vomiting  Objective: Vital signs in last 24 hours: Temp:  [98.1 F (36.7 C)-98.8 F (37.1 C)] 98.5 F (36.9 C) (01/24 0527) Pulse Rate:  [62-78] 77 (01/24 0527) Resp:  [16-18] 18 (01/24 0527) BP: (141-182)/(48-113) 151/69 (01/24 0527) SpO2:  [94 %-100 %] 98 % (01/24 0527)  Intake/Output from previous day:  Intake/Output Summary (Last 24 hours) at 11/04/2020 0705 Last data filed at 11/04/2020 0315 Gross per 24 hour  Intake 50 ml  Output 300 ml  Net -250 ml    Intake/Output this shift: No intake/output data recorded.  Labs: Recent Labs    11/02/20 0554 11/02/20 1558 11/02/20 2251 11/03/20 0544 11/04/20 0325  HGB 8.4* 8.8* 7.7* 8.2* 8.9*   Recent Labs    11/03/20 0544 11/04/20 0325  WBC 10.7* 11.4*  RBC 2.92* 3.13*  HCT 25.6* 26.5*  PLT 33* 105*   Recent Labs    11/03/20 0544 11/04/20 0325  NA 141 134*  K 4.0 3.8  CL 104 98  CO2 30 28  BUN 24* 20  CREATININE 0.80 0.75  GLUCOSE 77 85  CALCIUM 8.6* 9.0   No results for input(s): LABPT, INR in the last 72 hours.   EXAM General - Patient is Alert and Confused Extremity - Neurovascular intact Sensation intact distally Dorsiflexion/Plantar flexion intact Compartment soft Dressing/Incision - clean, dry, no drainage Motor Function - intact, moving foot and toes well on exam.  Ambulated 30 feet with physical therapy.  Past Medical History:  Diagnosis Date  . Alzheimer disease ([place])   . Anemia   . Anxiety   . Asthma    WELL CONTROLLED  . Chronic back pain   . Depression   . Depression   . GERD (gastroesophageal reflux disease)   . Hiatal hernia   . Hypercholesteremia   .  Hypothyroidism   . ITP (idiopathic thrombocytopenic purpura)    FOLLOWED BR DR Grayland Ormond  . LBBB (left bundle branch block)   . Osteoarthritis   . Osteoporosis   . Restless legs     Assessment/Plan: 4 Days Post-Op Procedure(s) (LRB): CANNULATED HIP PINNING (Left) Principal Problem:   Closed displaced fracture of left femoral neck (HCC) Active Problems:   Acute ITP (HCC)   Acquired hypothyroidism   Depressive disorder   Benign essential hypertension   Late onset Alzheimer's disease without behavioral disturbance (HCC)   Idiopathic thrombocythemia (HCC)   Hiatal hernia   GERD (gastroesophageal reflux disease)  Estimated body mass index is 27.06 kg/m as calculated from the following:   Height as of this encounter: 5\' 2"  (1.575 m).   Weight as of this encounter: 67.1 kg. Advance diet Up with therapy  Platelet count 33 this morning.  Oncology and internal medicine are following.  Appreciated.  Discharge planning with follow-up to [facility] clinic in 2 weeks.  DVT Prophylaxis - Lovenox, Foot Pumps and TED hose Weight-Bearing as tolerated to left leg  Reche Dixon, PA-C Orthopaedic Surgery 11/04/2020, 7:05 AM

## 2020-11-04 NOTE — Progress Notes (Signed)
Patients BS is 76 Post prandial , given Orange Juice and PRN ultram for c/o headache 10/10. New order for CT scan . Report given to CT and transport is on the way to get patient

## 2020-11-05 DIAGNOSIS — G301 Alzheimer's disease with late onset: Secondary | ICD-10-CM | POA: Diagnosis not present

## 2020-11-05 DIAGNOSIS — S72002A Fracture of unspecified part of neck of left femur, initial encounter for closed fracture: Secondary | ICD-10-CM | POA: Diagnosis not present

## 2020-11-05 DIAGNOSIS — F0280 Dementia in other diseases classified elsewhere without behavioral disturbance: Secondary | ICD-10-CM

## 2020-11-05 DIAGNOSIS — D693 Immune thrombocytopenic purpura: Secondary | ICD-10-CM | POA: Diagnosis not present

## 2020-11-05 DIAGNOSIS — G309 Alzheimer's disease, unspecified: Secondary | ICD-10-CM | POA: Diagnosis not present

## 2020-11-05 DIAGNOSIS — R52 Pain, unspecified: Secondary | ICD-10-CM | POA: Diagnosis not present

## 2020-11-05 DIAGNOSIS — I10 Essential (primary) hypertension: Secondary | ICD-10-CM | POA: Diagnosis not present

## 2020-11-05 DIAGNOSIS — F32A Depression, unspecified: Secondary | ICD-10-CM | POA: Diagnosis not present

## 2020-11-05 DIAGNOSIS — M818 Other osteoporosis without current pathological fracture: Secondary | ICD-10-CM

## 2020-11-05 DIAGNOSIS — Z515 Encounter for palliative care: Secondary | ICD-10-CM

## 2020-11-05 DIAGNOSIS — F419 Anxiety disorder, unspecified: Secondary | ICD-10-CM

## 2020-11-05 LAB — CBC
HCT: 26.7 % — ABNORMAL LOW (ref 36.0–46.0)
Hemoglobin: 8.7 g/dL — ABNORMAL LOW (ref 12.0–15.0)
MCH: 28.1 pg (ref 26.0–34.0)
MCHC: 32.6 g/dL (ref 30.0–36.0)
MCV: 86.1 fL (ref 80.0–100.0)
Platelets: 130 10*3/uL — ABNORMAL LOW (ref 150–400)
RBC: 3.1 MIL/uL — ABNORMAL LOW (ref 3.87–5.11)
RDW: 18.8 % — ABNORMAL HIGH (ref 11.5–15.5)
WBC: 11.1 10*3/uL — ABNORMAL HIGH (ref 4.0–10.5)
nRBC: 0.3 % — ABNORMAL HIGH (ref 0.0–0.2)

## 2020-11-05 LAB — BASIC METABOLIC PANEL
Anion gap: 9 (ref 5–15)
BUN: 32 mg/dL — ABNORMAL HIGH (ref 8–23)
CO2: 29 mmol/L (ref 22–32)
Calcium: 8.9 mg/dL (ref 8.9–10.3)
Chloride: 96 mmol/L — ABNORMAL LOW (ref 98–111)
Creatinine, Ser: 0.98 mg/dL (ref 0.44–1.00)
GFR, Estimated: 57 mL/min — ABNORMAL LOW (ref >60)
Glucose, Bld: 104 mg/dL — ABNORMAL HIGH (ref 70–99)
Potassium: 3.9 mmol/L (ref 3.5–5.1)
Sodium: 134 mmol/L — ABNORMAL LOW (ref 135–145)

## 2020-11-05 LAB — PHOSPHORUS: Phosphorus: 5 mg/dL — ABNORMAL HIGH (ref 2.5–4.6)

## 2020-11-05 LAB — MAGNESIUM: Magnesium: 2 mg/dL (ref 1.7–2.4)

## 2020-11-05 MED ORDER — GUAIFENESIN-DM 100-10 MG/5ML PO SYRP
5.0000 mL | ORAL_SOLUTION | ORAL | Status: DC | PRN
  Administered 2020-11-05 – 2020-11-06 (×2): 5 mL via ORAL
  Filled 2020-11-05 (×3): qty 5

## 2020-11-05 NOTE — Plan of Care (Signed)

## 2020-11-05 NOTE — Progress Notes (Signed)
PT Cancellation Note  Patient Details Name: Ariana [name] MRN: [id number] DOB: 1933/07/26   Cancelled Treatment:     PT attempt. Pt currently working with OT. Will return shortly and continue to follow per POC   Willette Pa 11/05/2020, 3:19 PM

## 2020-11-05 NOTE — Consult Note (Signed)
Palliative Medicine [facility]  [phone] [phone]   Name: Ariana [name] Date: 11/05/2020 MRN: [id number]  DOB: 19-Nov-1932  Patient Care Team: Marguarite Arbour, MD as PCP - General (Internal Medicine) Jeralyn Ruths, MD as Consulting Physician (Oncology)    REASON FOR CONSULTATION: Ariana [name] is a 87 y.o. female with multiple medical problems including chronic refractory ITP on Nplate, Alzheimer's dementia, OSA, and anxiety/depression.  Patient was admitted to hospital 10/30/2020 with left femoral neck fracture after mechanical fall.  Patient underwent surgical repair.  Patient was referred to palliative care to address goals and provide with support.  SOCIAL HISTORY:     reports that she has never smoked. She has never used smokeless tobacco. She reports that she does not drink alcohol and does not use drugs.  Patient is married and lives at home with her husband.  Both of patient's children are now deceased she previously worked in Designer, fashion/clothing.  ADVANCE DIRECTIVES:  None on file  CODE STATUS: DNR  PAST MEDICAL HISTORY: Past Medical History:  Diagnosis Date  . Alzheimer disease (HCC)   . Anemia   . Anxiety   . Asthma    WELL CONTROLLED  . Chronic back pain   . Depression   . Depression   . GERD (gastroesophageal reflux disease)   . Hiatal hernia   . Hypercholesteremia   . Hypothyroidism   . ITP (idiopathic thrombocytopenic purpura)    FOLLOWED BR DR Orlie Dakin  . LBBB (left bundle branch block)   . Osteoarthritis   . Osteoporosis   . Restless legs     PAST SURGICAL HISTORY:  Past Surgical History:  Procedure Laterality Date  . CARPAL TUNNEL RELEASE    . ESOPHAGOGASTRODUODENOSCOPY (EGD) WITH PROPOFOL N/A 09/10/2015   Procedure: ESOPHAGOGASTRODUODENOSCOPY (EGD) WITH PROPOFOL;  Surgeon: Christena Deem, MD;  Location: [facility] ENDOSCOPY;  Service: Endoscopy;  Laterality: N/A;  . HIP  PINNING,CANNULATED Left 10/31/2020   Procedure: CANNULATED HIP PINNING;  Surgeon: Signa Kell, MD;  Location: [facility] ORS;  Service: Orthopedics;  Laterality: Left;  . IRRIGATION AND DEBRIDEMENT HEMATOMA Right 07/13/2018   Procedure: IRRIGATION AND DEBRIDEMENT HEMATOMA-RIGHT SHIN;  Surgeon: Carolan Shiver, MD;  Location: [facility] ORS;  Service: General;  Laterality: Right;  . KYPHOPLASTY N/A 05/02/2019   Procedure: L1 KYPHOPLASTY;  Surgeon: Kennedy Bucker, MD;  Location: [facility] ORS;  Service: Orthopedics;  Laterality: N/A;  . LUMBAR DISC SURGERY    . REVERSE SHOULDER ARTHROPLASTY Left 06/25/2020   Procedure: REVERSE SHOULDER ARTHROPLASTY;  Surgeon: Christena Flake, MD;  Location: [facility] ORS;  Service: Orthopedics;  Laterality: Left;  . SPLENECTOMY, PARTIAL    . TOTAL ABDOMINAL HYSTERECTOMY      HEMATOLOGY/ONCOLOGY HISTORY:  Oncology History   No history exists.    ALLERGIES:  is allergic to aspirin, diazepam, other, tetanus toxoids, and morphine.  MEDICATIONS:  Current Facility-Administered Medications  Medication Dose Route Frequency Provider Last Rate Last Admin  . 0.9 %  sodium chloride infusion   Intravenous Continuous Signa Kell, MD   Stopped at 11/04/20 0315  . acetaminophen (TYLENOL) tablet 1,000 mg  1,000 mg Oral Q8H Signa Kell, MD   1,000 mg at 11/05/20 0646  . albuterol (VENTOLIN HFA) 108 (90 Base) MCG/ACT inhaler 2 puff  2 puff Inhalation Q6H PRN Signa Kell, MD      . ALPRAZolam Prudy Feeler) tablet 0.5 mg  0.5 mg Oral TID PRN Signa Kell, MD   0.5 mg at 11/04/20 1758  .  amLODipine (NORVASC) tablet 5 mg  5 mg Oral Daily Val Riles, MD   5 mg at 11/05/20 0945  . ascorbic acid (VITAMIN C) tablet 500 mg  500 mg Oral Daily Leim Fabry, MD   500 mg at 11/05/20 0940  . benzonatate (TESSALON) capsule 200 mg  200 mg Oral TID Leim Fabry, MD   200 mg at 11/05/20 0943  . bisacodyl (DULCOLAX) suppository 10 mg  10 mg Rectal Daily PRN Leim Fabry, MD      . buPROPion (WELLBUTRIN XL) 24 hr  tablet 150 mg  150 mg Oral Daily Leim Fabry, MD   150 mg at 11/05/20 0945  . busPIRone (BUSPAR) tablet 15 mg  15 mg Oral BID Leim Fabry, MD   15 mg at 11/05/20 0944  . calcium-vitamin D (OSCAL WITH D) 500-200 MG-UNIT per tablet 1 tablet  1 tablet Oral Daily Leim Fabry, MD   1 tablet at 11/05/20 0945  . carboxymethylcellul-glycerin (REFRESH OPTIVE) 0.5-0.9 % ophthalmic solution 1 drop  1 drop Both Eyes QID PRN Leim Fabry, MD      . Chlorhexidine Gluconate Cloth 2 % PADS 6 each  6 each Topical Q0600 Val Riles, MD   6 each at 11/05/20 [phone]  . cholecalciferol (VITAMIN D3) tablet 2,000 Units  2,000 Units Oral Daily Leim Fabry, MD   2,000 Units at 11/05/20 [phone]  . citalopram (CELEXA) tablet 20 mg  20 mg Oral Chong Sicilian, Tarry Kos, MD   20 mg at 11/05/20 [phone]  . docusate sodium (COLACE) capsule 100 mg  100 mg Oral BID Leim Fabry, MD   100 mg at 11/05/20 8119  . donepezil (ARICEPT) tablet 10 mg  10 mg Oral BID Leim Fabry, MD   10 mg at 11/05/20 0944  . enoxaparin (LOVENOX) injection 40 mg  40 mg Subcutaneous QPM Val Riles, MD   40 mg at 11/04/20 1759  . feeding supplement (ENSURE ENLIVE / ENSURE PLUS) liquid 237 mL  237 mL Oral BID BM Leim Fabry, MD   237 mL at 11/05/20 0939  . fluticasone (FLONASE) 50 MCG/ACT nasal spray 2 spray  2 spray Each Nare Daily PRN Leim Fabry, MD      . gabapentin (NEURONTIN) capsule 300 mg  300 mg Oral QID Leim Fabry, MD   300 mg at 11/05/20 0945  . guaiFENesin-dextromethorphan (ROBITUSSIN DM) 100-10 MG/5ML syrup 5 mL  5 mL Oral Q4H PRN Lang Snow, NP   5 mL at 11/05/20 0445  . HYDROmorphone (DILAUDID) injection 0.25-0.5 mg  0.25-0.5 mg Intravenous Q2H PRN Leim Fabry, MD      . lamoTRIgine (LAMICTAL) tablet 50 mg  50 mg Oral QHS Leim Fabry, MD   50 mg at 11/04/20 2215  . levothyroxine (SYNTHROID) tablet 50 mcg  50 mcg Oral QAC breakfast Leim Fabry, MD   50 mcg at 11/05/20 [phone]  . magnesium oxide (MAG-OX) tablet 400 mg  400 mg Oral BID  Val Riles, MD   400 mg at 11/05/20 2956  . melatonin tablet 6 mg  6 mg Oral QPM Val Riles, MD   6 mg at 11/04/20 1758  . memantine (NAMENDA) tablet 10 mg  10 mg Oral BID Leim Fabry, MD   10 mg at 11/05/20 0945  . methocarbamol (ROBAXIN) tablet 500 mg  500 mg Oral Q6H PRN Leim Fabry, MD   500 mg at 11/05/20 0945   Or  . methocarbamol (ROBAXIN) 500 mg in dextrose 5 % 50 mL IVPB  500 mg Intravenous  Q6H PRN Leim Fabry, MD      . metoCLOPramide (REGLAN) tablet 5-10 mg  5-10 mg Oral Q8H PRN Leim Fabry, MD       Or  . metoCLOPramide (REGLAN) injection 5-10 mg  5-10 mg Intravenous Q8H PRN Leim Fabry, MD      . montelukast (SINGULAIR) tablet 10 mg  10 mg Oral QHS Leim Fabry, MD   10 mg at 11/04/20 2215  . multivitamin-lutein (OCUVITE-LUTEIN) capsule 1 capsule  1 capsule Oral BID Leim Fabry, MD   1 capsule at 11/05/20 0943  . mupirocin ointment (BACTROBAN) 2 %   Nasal BID Val Riles, MD   Given at 11/05/20 [phone]  . ondansetron (ZOFRAN) tablet 4 mg  4 mg Oral Q6H PRN Leim Fabry, MD       Or  . ondansetron [facility]) injection 4 mg  4 mg Intravenous Q6H PRN Leim Fabry, MD   4 mg at 11/03/20 2252  . oxybutynin (DITROPAN) tablet 5 mg  5 mg Oral TID Leim Fabry, MD   5 mg at 11/05/20 0943  . oxyCODONE (Oxy IR/ROXICODONE) immediate release tablet 2.5-5 mg  2.5-5 mg Oral Q3H PRN Leim Fabry, MD   5 mg at 11/03/20 1215  . oxyCODONE (Oxy IR/ROXICODONE) immediate release tablet 5-10 mg  5-10 mg Oral Q4H PRN Leim Fabry, MD   10 mg at 11/04/20 1430  . pantoprazole (PROTONIX) EC tablet 20 mg  20 mg Oral BID Leim Fabry, MD   20 mg at 11/05/20 0941  . pravastatin (PRAVACHOL) tablet 20 mg  20 mg Oral QHS Leim Fabry, MD   20 mg at 11/04/20 2213  . senna (SENOKOT) tablet 8.6 mg  1 tablet Oral BID Leim Fabry, MD   8.6 mg at 11/05/20 0948  . senna-docusate (Senokot-S) tablet 1 tablet  1 tablet Oral QHS PRN Leim Fabry, MD      . sodium phosphate (FLEET) 7-19 GM/118ML enema 1 enema  1  enema Rectal Once PRN Leim Fabry, MD      . traMADol Veatrice Bourbon) tablet 50 mg  50 mg Oral Q6H PRN Leim Fabry, MD   50 mg at 11/05/20 [id number]  . vitamin B-12 (CYANOCOBALAMIN) tablet 1,000 mcg  1,000 mcg Oral Daily Leim Fabry, MD   1,000 mcg at 11/05/20 0945  . vitamin E capsule 400 Units  400 Units Oral Daily Leim Fabry, MD   400 Units at 11/05/20 0942    VITAL SIGNS: BP (!) 123/55 (BP Location: Right Arm)   Pulse (!) 55   Temp 97.9 F (36.6 C) (Oral)   Resp 15   Ht 5\' 2"  (1.575 m)   Wt 147 lb 14.9 oz (67.1 kg)   SpO2 97%   BMI 27.06 kg/m  Filed Weights   10/30/20 0846 10/31/20 1136  Weight: 148 lb (67.1 kg) 147 lb 14.9 oz (67.1 kg)    Estimated body mass index is 27.06 kg/m as calculated from the following:   Height as of this encounter: 5\' 2"  (1.575 m).   Weight as of this encounter: 147 lb 14.9 oz (67.1 kg).  LABS: CBC:    Component Value Date/Time   WBC 11.1 (H) 11/05/2020 0612   HGB 8.7 (L) 11/05/2020 0612   HGB 14.2 01/18/2015 0941   HCT 26.7 (L) 11/05/2020 0612   HCT 43.0 01/18/2015 0941   PLT 130 (L) 11/05/2020 0612   PLT 97 (L) 02/04/2015 0949   MCV 86.1 11/05/2020 0612   MCV 90 01/18/2015 0941   NEUTROABS  10.2 (H) 10/31/2020 1057   NEUTROABS 6.5 01/18/2015 0941   LYMPHSABS 1.5 10/31/2020 1057   LYMPHSABS 2.3 01/18/2015 0941   MONOABS 1.1 (H) 10/31/2020 1057   MONOABS 0.7 01/18/2015 0941   EOSABS 0.3 10/31/2020 1057   EOSABS 0.1 01/18/2015 0941   BASOSABS 0.1 10/31/2020 1057   BASOSABS 0.1 01/18/2015 0941   Comprehensive Metabolic Panel:    Component Value Date/Time   NA 134 (L) 11/05/2020 0612   NA 143 06/13/2014 1428   K 3.9 11/05/2020 0612   K 4.2 06/13/2014 1428   CL 96 (L) 11/05/2020 0612   CL 105 06/13/2014 1428   CO2 29 11/05/2020 0612   CO2 31 06/13/2014 1428   BUN 32 (H) 11/05/2020 0612   BUN 15 06/13/2014 1428   CREATININE 0.98 11/05/2020 0612   CREATININE 1.05 06/13/2014 1428   GLUCOSE 104 (H) 11/05/2020 0612   GLUCOSE 70  06/13/2014 1428   CALCIUM 8.9 11/05/2020 0612   CALCIUM 8.5 06/13/2014 1428   AST 30 10/30/2020 0850   AST 31 06/01/2014 1109   ALT 14 10/30/2020 0850   ALT 76 (H) 06/01/2014 1109   ALKPHOS 90 10/30/2020 0850   ALKPHOS 93 06/01/2014 1109   BILITOT 0.5 10/30/2020 0850   BILITOT 0.3 06/01/2014 1109   PROT 6.1 (L) 10/30/2020 0850   PROT 6.5 06/01/2014 1109   ALBUMIN 3.7 10/30/2020 0850   ALBUMIN 3.4 06/01/2014 1109    RADIOGRAPHIC STUDIES: DG Chest 1 View  Result Date: 10/30/2020 CLINICAL DATA:  Fall. EXAM: CHEST  1 VIEW COMPARISON:  Chest x-ray dated August 18, 2020. FINDINGS: Unchanged mild cardiomegaly. Normal pulmonary vascularity. No focal consolidation, pleural effusion, or pneumothorax. No acute osseous abnormality. Prior left shoulder reverse total arthroplasty. IMPRESSION: 1. No active disease. Electronically Signed   By: Titus Dubin M.D.   On: 10/30/2020 09:28   CT HEAD WO CONTRAST  Result Date: 11/04/2020 CLINICAL DATA:  Headache. Intracranial hemorrhage suspected. History of hip surgery. EXAM: CT HEAD WITHOUT CONTRAST TECHNIQUE: Contiguous axial images were obtained from the base of the skull through the vertex without intravenous contrast. COMPARISON:  03/18/2020 FINDINGS: Brain: Mild cerebral atrophy is unchanged. Again noted is diffuse low-density in the periventricular and subcortical white matter. White matter changes are stable. Negative for acute hemorrhage, mass lesion, midline shift, hydrocephalus or large infarct. Vascular: No hyperdense vessel or unexpected calcification. Skull: Normal. Negative for fracture or focal lesion. Sinuses/Orbits: Small amount of fluid in the posterior right mastoid air cells. Minimal mucosal disease in the right anterior ethmoid air cells. Other: None IMPRESSION: 1. No acute intracranial abnormality. 2. Stable white matter changes that likely represent chronic small vessel ischemic disease. 3. Minimal sinus disease. Electronically Signed    By: Markus Daft M.D.   On: 11/04/2020 09:44   CT PELVIS WO CONTRAST  Result Date: 10/30/2020 CLINICAL DATA:  Slipped on ice. EXAM: CT PELVIS WITHOUT CONTRAST TECHNIQUE: Multidetector CT imaging of the pelvis was performed following the standard protocol without intravenous contrast. COMPARISON:  None. FINDINGS: Urinary Tract:  Unremarkable Bowel: Multiple diverticula sigmoid colon without acute inflammation. Vascular/Lymphatic: No pelvic adenopathy. Reproductive:  Post hysterectomy.  Adnexa unremarkable Other: Musculoskeletal: Subcapital femoral neck fracture with mild impaction. Minimal angulation. The joint is maintained. No hematoma. There is bruising in the subcutaneous tissue over the LEFT hip. No muscular hematoma. Posterior lumbar fusion IMPRESSION: LEFT femoral neck subcapital impaction fracture. Electronically Signed   By: Suzy Bouchard M.D.   On: 10/30/2020 10:06   DG  HIP OPERATIVE UNILAT W OR W/O PELVIS LEFT  Result Date: 10/31/2020 CLINICAL DATA:  Hip fixation EXAM: OPERATIVE left HIP (WITH PELVIS IF PERFORMED)  VIEWS TECHNIQUE: Fluoroscopic spot image(s) were submitted for interpretation post-operatively. COMPARISON:  None. FINDINGS: Five intraop views were submitted for review. The patient is status post ORIF cannulated pin fixation of the left femoral neck fracture. Fluoro time 1 minutes 31 seconds IMPRESSION: Status post ORIF pin fixation of the femoral neck fracture Electronically Signed   By: Prudencio Pair M.D.   On: 10/31/2020 19:28   DG Hip Unilat With Pelvis 2-3 Views Left  Result Date: 10/30/2020 CLINICAL DATA:  Fall on ice with left hip pain EXAM: DG HIP (WITH OR WITHOUT PELVIS) 2-3V LEFT COMPARISON:  None. FINDINGS: There is cortical irregularity and slight angulation in the subcapital left femoral neck suspicious for nondisplaced subcapital left femoral neck fracture. No additional potential fracture. No left hip dislocation. No focal osseous lesions. Partially visualized  bilateral posterior spinal fusion hardware in the lower lumbar spine. No significant degenerative changes in the hip joints. IMPRESSION: Cortical irregularity and slight angulation in the subcapital left femoral neck suspicious for nondisplaced subcapital left femoral neck fracture. No left hip dislocation. Consider left hip CT for further evaluation. Electronically Signed   By: Ilona Sorrel M.D.   On: 10/30/2020 09:28    PERFORMANCE STATUS (ECOG) : 3 - Symptomatic, >50% confined to bed  Review of Systems Unless otherwise noted, a complete review of systems is negative.  Physical Exam General: NAD, thin, frail-appearing Pulmonary: Unlabored Extremities: no edema, no joint deformities Skin: no rashes Neurological: Weakness, some confusion  IMPRESSION: Patient known to me from the clinic.  Hospitalization was complicated by thrombocytopenia, which is markedly improved following transfusions, Nplate and IVIG.  Patient seen briefly yesterday and appeared agitated and delirious.  She was hurting and received dose of tramadol.  Today, she is calm and appears to be at her cognitive baseline with mild dementia.  She denies pain or other distressing symptoms.  Note recommendation per PT for SNF.  I would recommend the patient be followed at SNF by palliative care.  I will also plan to follow her in the clinic.  Patient is noted to be a DNR.  I have tried twice to call her husband both unable to reach him.  PLAN: -Continue current scope of treatment -Recommend SNF with palliative care -Plan to follow in the clinic -Will try to reach husband to discuss goals  Case and plan discussed with Dr. Grayland Ormond   Time Total: 60 minutes  Visit consisted of counseling and education dealing with the complex and emotionally intense issues of symptom management and palliative care in the setting of serious and potentially life-threatening illness.Greater than 50%  of this time was spent counseling and  coordinating care related to the above assessment and plan.  Signed by: Altha Harm, PhD, NP-C

## 2020-11-05 NOTE — Progress Notes (Signed)
PROGRESS NOTE    Ariana [name]  MGQ:[id number] DOB: 1933-09-20 DOA: 10/30/2020 PCP: Idelle Crouch, MD   Assessment & Plan:   Principal Problem:   Closed displaced fracture of left femoral neck ([place]) Active Problems:   Acute ITP ([place])   Acquired hypothyroidism   Depressive disorder   Benign essential hypertension   Late onset Alzheimer's disease without behavioral disturbance (HCC)   Idiopathic thrombocythemia ([place])   Hiatal hernia   GERD (gastroesophageal reflux disease)  Closed displaced fracture of left femoral neck: secondary to mechanical fall. S/p ORIF on 10/31/20 as per ortho surg.   Idiopathic thrombocytopenic purpura: chronic. S/p Nplate x 1, 2 units of platelets, IVIG x 2 doses as per heme.   HTN: continue on amlodipine   Hypothyroidism: continue on synthroid   Alzheimer's dementia: without behavioral disturbance. Continue on aricept, namenda   Anxiety: severity unknown. Continue on home dose of alprazolam   Depression: severity unknown. Continue on home dose of wellbutrin, buspar   Hernia with GERD: continue on PPI    DVT prophylaxis: lovenox Code Status: DNR Family Communication: called pt's husband, Kyung Rudd, no answer so I left a message  Disposition Plan: SNF vs HH   Status is: Inpatient  Remains inpatient appropriate because:Unsafe d/c plan   Dispo: The patient is from: Home              Anticipated d/c is to: SNF              Anticipated d/c date is: 2 days              Patient currently is medically stable to d/c.   Difficult to place patient Yes    Consultants:   Palliative care  Ortho surg   Procedures:  Antimicrobials:  Subjective: Pt c/o hip pain   Objective: Vitals:   11/04/20 1517 11/04/20 2039 11/05/20 0201 11/05/20 0746  BP: (!) 106/51 (!) 127/56 (!) 133/42 (!) 123/55  Pulse: 72 67 67 (!) 55  Resp: 17 18 18 15   Temp: 98.6 F (37 C) 98.2 F (36.8 C) 97.7 F (36.5 C) 97.9 F (36.6 C)  TempSrc:   Oral  Oral  SpO2: 95% 97% 100% 97%  Weight:      Height:       No intake or output data in the 24 hours ending 11/05/20 0839 Filed Weights   10/30/20 0846 10/31/20 1136  Weight: 67.1 kg 67.1 kg    Examination:  General exam: Appears calm and comfortable  Respiratory system: Clear to auscultation. Respiratory effort normal. Cardiovascular system: S1 & S2 +. No rubs, gallops or clicks.  Gastrointestinal system: Abdomen is nondistended, soft and nontender. Normal bowel sounds heard. Central nervous system: Alert and oriented. Moves all 4 extremities  Psychiatry: Judgement and insight appear abnormal. Flat mood and affect    Data Reviewed: I have personally reviewed following labs and imaging studies  CBC: Recent Labs  Lab 10/31/20 1057 11/01/20 0346 11/02/20 1558 11/02/20 2251 11/03/20 0544 11/04/20 0325 11/05/20 0612  WBC 13.2*   < > 14.5* 15.1* 10.7* 11.4* 11.1*  NEUTROABS 10.2*  --   --   --   --   --   --   HGB 11.3*   < > 8.8* 7.7* 8.2* 8.9* 8.7*  HCT 35.1*   < > 27.0* 24.0* 25.6* 26.5* 26.7*  MCV 86.9   < > 87.7 87.3 87.7 84.7 86.1  PLT 18*   < > 9* 27* 33* 105* 130*   < > =  values in this interval not displayed.   Basic Metabolic Panel: Recent Labs  Lab 11/01/20 0346 11/02/20 0554 11/03/20 0544 11/04/20 0325 11/05/20 0612  NA 140 140 141 134* 134*  K 4.3 4.2 4.0 3.8 3.9  CL 106 105 104 98 96*  CO2 24 29 30 28 29   GLUCOSE 131* 104* 77 85 104*  BUN 18 28* 24* 20 32*  CREATININE 0.81 0.97 0.80 0.75 0.98  CALCIUM 8.6* 8.8* 8.6* 9.0 8.9  MG  --   --  1.8 1.7 2.0  PHOS  --   --  3.4 4.0 5.0*   GFR: Estimated Creatinine Clearance: 37.7 mL/min (by C-G formula based on SCr of 0.98 mg/dL). Liver Function Tests: Recent Labs  Lab 10/30/20 0850  AST 30  ALT 14  ALKPHOS 90  BILITOT 0.5  PROT 6.1*  ALBUMIN 3.7   No results for input(s): LIPASE, AMYLASE in the last 168 hours. No results for input(s): AMMONIA in the last 168 hours. Coagulation  Profile: Recent Labs  Lab 10/30/20 0935  INR 1.1   Cardiac Enzymes: No results for input(s): CKTOTAL, CKMB, CKMBINDEX, TROPONINI in the last 168 hours. BNP (last 3 results) No results for input(s): PROBNP in the last 8760 hours. HbA1C: No results for input(s): HGBA1C in the last 72 hours. CBG: Recent Labs  Lab 11/04/20 0902  GLUCAP 74   Lipid Profile: No results for input(s): CHOL, HDL, LDLCALC, TRIG, CHOLHDL, LDLDIRECT in the last 72 hours. Thyroid Function Tests: No results for input(s): TSH, T4TOTAL, FREET4, T3FREE, THYROIDAB in the last 72 hours. Anemia Panel: No results for input(s): VITAMINB12, FOLATE, FERRITIN, TIBC, IRON, RETICCTPCT in the last 72 hours. Sepsis Labs: No results for input(s): PROCALCITON, LATICACIDVEN in the last 168 hours.  Recent Results (from the past 240 hour(s))  Resp Panel by RT-PCR (Flu A&B, Covid) Nasopharyngeal Swab     Status: None   Collection Time: 10/30/20  9:35 AM   Specimen: Nasopharyngeal Swab; Nasopharyngeal(NP) swabs in vial transport medium  Result Value Ref Range Status   SARS Coronavirus 2 by RT PCR NEGATIVE NEGATIVE Final    Comment: (NOTE) SARS-CoV-2 target nucleic acids are NOT DETECTED.  The SARS-CoV-2 RNA is generally detectable in upper respiratory specimens during the acute phase of infection. The lowest concentration of SARS-CoV-2 viral copies this assay can detect is 138 copies/mL. A negative result does not preclude SARS-Cov-2 infection and should not be used as the sole basis for treatment or other patient management decisions. A negative result may occur with  improper specimen collection/handling, submission of specimen other than nasopharyngeal swab, presence of viral mutation(s) within the areas targeted by this assay, and inadequate number of viral copies(<138 copies/mL). A negative result must be combined with clinical observations, patient history, and epidemiological information. The expected result is  Negative.  Fact Sheet for Patients:  EntrepreneurPulse.com.au  Fact Sheet for Healthcare Providers:  IncredibleEmployment.be  This test is no t yet approved or cleared by the [place] FDA and  has been authorized for detection and/or diagnosis of SARS-CoV-2 by FDA under an Emergency Use Authorization (EUA). This EUA will remain  in effect (meaning this test can be used) for the duration of the COVID-19 declaration under Section 564(b)(1) of the Act, 21 U.S.C.section 360bbb-3(b)(1), unless the authorization is terminated  or revoked sooner.       Influenza A by PCR NEGATIVE NEGATIVE Final   Influenza B by PCR NEGATIVE NEGATIVE Final    Comment: (NOTE) The Xpert Xpress SARS-CoV-2/FLU/RSV  plus assay is intended as an aid in the diagnosis of influenza from Nasopharyngeal swab specimens and should not be used as a sole basis for treatment. Nasal washings and aspirates are unacceptable for Xpert Xpress SARS-CoV-2/FLU/RSV testing.  Fact Sheet for Patients: EntrepreneurPulse.com.au  Fact Sheet for Healthcare Providers: IncredibleEmployment.be  This test is not yet approved or cleared by the [place] FDA and has been authorized for detection and/or diagnosis of SARS-CoV-2 by FDA under an Emergency Use Authorization (EUA). This EUA will remain in effect (meaning this test can be used) for the duration of the COVID-19 declaration under Section 564(b)(1) of the Act, 21 U.S.C. section 360bbb-3(b)(1), unless the authorization is terminated or revoked.  Performed at [facility], [address] [place], [place] [address]   Surgical pcr screen     Status: Abnormal   Collection Time: 10/31/20 10:14 AM   Specimen: Nasal Mucosa; Nasal Swab  Result Value Ref Range Status   MRSA, PCR POSITIVE (A) NEGATIVE Final    Comment: RESULT CALLED TO, READ BACK BY AND VERIFIED WITH: DANA LINONS 10/31/20 1210  KLW    Staphylococcus aureus POSITIVE (A) NEGATIVE Final    Comment: (NOTE) The Xpert SA Assay (FDA approved for NASAL specimens in patients 64 years of age and older), is one component of a comprehensive surveillance program. It is not intended to diagnose infection nor to guide or monitor treatment. Performed at [facility], [address], [place], [place] [address]          Radiology Studies: CT HEAD WO CONTRAST  Result Date: 11/04/2020 CLINICAL DATA:  Headache. Intracranial hemorrhage suspected. History of hip surgery. EXAM: CT HEAD WITHOUT CONTRAST TECHNIQUE: Contiguous axial images were obtained from the base of the skull through the vertex without intravenous contrast. COMPARISON:  03/18/2020 FINDINGS: Brain: Mild cerebral atrophy is unchanged. Again noted is diffuse low-density in the periventricular and subcortical white matter. White matter changes are stable. Negative for acute hemorrhage, mass lesion, midline shift, hydrocephalus or large infarct. Vascular: No hyperdense vessel or unexpected calcification. Skull: Normal. Negative for fracture or focal lesion. Sinuses/Orbits: Small amount of fluid in the posterior right mastoid air cells. Minimal mucosal disease in the right anterior ethmoid air cells. Other: None IMPRESSION: 1. No acute intracranial abnormality. 2. Stable white matter changes that likely represent chronic small vessel ischemic disease. 3. Minimal sinus disease. Electronically Signed   By: Markus Daft M.D.   On: 11/04/2020 09:44        Scheduled Meds: . acetaminophen  1,000 mg Oral Q8H  . amLODipine  5 mg Oral Daily  . vitamin C  500 mg Oral Daily  . benzonatate  200 mg Oral TID  . buPROPion  150 mg Oral Daily  . busPIRone  15 mg Oral BID  . calcium-vitamin D  1 tablet Oral Daily  . Chlorhexidine Gluconate Cloth  6 each Topical Q0600  . cholecalciferol  2,000 Units Oral Daily  . citalopram  20 mg Oral BH-q7a  . docusate sodium  100 mg Oral  BID  . donepezil  10 mg Oral BID  . enoxaparin (LOVENOX) injection  40 mg Subcutaneous QPM  . feeding supplement  237 mL Oral BID BM  . gabapentin  300 mg Oral QID  . lamoTRIgine  50 mg Oral QHS  . levothyroxine  50 mcg Oral QAC breakfast  . magnesium oxide  400 mg Oral BID  . melatonin  6 mg Oral QPM  . memantine  10 mg  Oral BID  . montelukast  10 mg Oral QHS  . multivitamin-lutein  1 capsule Oral BID  . mupirocin ointment   Nasal BID  . oxybutynin  5 mg Oral TID  . pantoprazole  20 mg Oral BID  . pravastatin  20 mg Oral QHS  . senna  1 tablet Oral BID  . vitamin B-12  1,000 mcg Oral Daily  . vitamin E  400 Units Oral Daily   Continuous Infusions: . sodium chloride Stopped (11/04/20 0315)  . methocarbamol (ROBAXIN) IV       LOS: 6 days    Time spent: 32 mins     Wyvonnia Dusky, MD [facility] Pager [phone]  If 7PM-7AM, please contact night-coverage 11/05/2020, 8:39 AM

## 2020-11-05 NOTE — Progress Notes (Signed)
  Subjective: 5 Days Post-Op Procedure(s) (LRB): CANNULATED HIP PINNING (Left) Patient reports pain as mild.   Patient is well, and has had no acute complaints or problems Plan is to go home versus Rehab after hospital stay. Negative for chest pain and shortness of breath Fever: no Gastrointestinal: Negative for nausea and vomiting  Objective: Vital signs in last 24 hours: Temp:  [97.7 F (36.5 C)-98.6 F (37 C)] 97.7 F (36.5 C) (01/25 0201) Pulse Rate:  [67-72] 67 (01/25 0201) Resp:  [16-18] 18 (01/25 0201) BP: (106-153)/(42-72) 133/42 (01/25 0201) SpO2:  [95 %-100 %] 100 % (01/25 0201)  Intake/Output from previous day: No intake or output data in the 24 hours ending 11/05/20 0705  Intake/Output this shift: No intake/output data recorded.  Labs: Recent Labs    11/02/20 1558 11/02/20 2251 11/03/20 0544 11/04/20 0325 11/05/20 0612  HGB 8.8* 7.7* 8.2* 8.9* 8.7*   Recent Labs    11/04/20 0325 11/05/20 0612  WBC 11.4* PENDING  RBC 3.13* 3.10*  HCT 26.5* 26.7*  PLT 105* 130*   Recent Labs    11/03/20 0544 11/04/20 0325  NA 141 134*  K 4.0 3.8  CL 104 98  CO2 30 28  BUN 24* 20  CREATININE 0.80 0.75  GLUCOSE 77 85  CALCIUM 8.6* 9.0   No results for input(s): LABPT, INR in the last 72 hours.   EXAM General - Patient is Alert and Confused Extremity - Neurovascular intact Sensation intact distally Dorsiflexion/Plantar flexion intact Compartment soft Dressing/Incision - clean, dry, scant drainage with positive hematoma surrounding left thigh incision Motor Function - intact, moving foot and toes well on exam.  Ambulated 30 feet with physical therapy.  Past Medical History:  Diagnosis Date  . Alzheimer disease ([place])   . Anemia   . Anxiety   . Asthma    WELL CONTROLLED  . Chronic back pain   . Depression   . Depression   . GERD (gastroesophageal reflux disease)   . Hiatal hernia   . Hypercholesteremia   . Hypothyroidism   . ITP (idiopathic  thrombocytopenic purpura)    FOLLOWED BR DR Grayland Ormond  . LBBB (left bundle branch block)   . Osteoarthritis   . Osteoporosis   . Restless legs     Assessment/Plan: 5 Days Post-Op Procedure(s) (LRB): CANNULATED HIP PINNING (Left) Principal Problem:   Closed displaced fracture of left femoral neck (HCC) Active Problems:   Acute ITP (HCC)   Acquired hypothyroidism   Depressive disorder   Benign essential hypertension   Late onset Alzheimer's disease without behavioral disturbance (HCC)   Idiopathic thrombocythemia (HCC)   Hiatal hernia   GERD (gastroesophageal reflux disease)  Estimated body mass index is 27.06 kg/m as calculated from the following:   Height as of this encounter: 5\' 2"  (1.575 m).   Weight as of this encounter: 67.1 kg. Advance diet Up with therapy  Platelet count 105 this morning.  Oncology and internal medicine are following.  Appreciated.  Discharge planning with follow-up to [facility] clinic in 2 weeks.  DVT Prophylaxis - Lovenox, Foot Pumps and TED hose Weight-Bearing as tolerated to left leg  Reche Dixon, PA-C Orthopaedic Surgery 11/05/2020, 7:05 AM

## 2020-11-05 NOTE — Progress Notes (Signed)
[place] Room [facility]) Hospital Liaison RN note:  Received new referral for [facility] out patient palliative program to follow post discharge from Altha Harm, NP. Patient information given to referral. Jhonnie Garner, TOC is aware. Plan is to discharge to SNF. [facility] will follow for disposition.   Thank you for this referral.  Zandra Abts, RN [facility] Liaison [phone]

## 2020-11-05 NOTE — TOC Progression Note (Signed)
Transition of Care [facility]) - Progression Note    Patient Details  Name: Ariana [name] MRN: [id number] Date of Birth: 1933-08-06  Transition of Care [facility]) CM/SW [place], RN Phone Number: 11/05/2020, 2:20 PM  Clinical Narrative:   RNCM attempted to reach out to patient's husband without success. RNCM received phone call in follow up from patient's neighbor April Basden. April reports that she and some of patient's family members have tried to talk with Mr. Ariana [name] about patient needing more care than he can provide but he was still refusing to them to allow placement for her. Discussed with Ms. Basden that if she has this great of concerns she should reach out to APS to see what assistance they can provide.     Expected Discharge Plan: [place] Barriers to Discharge: Continued Medical Work up  Expected Discharge Plan and Services Expected Discharge Plan: [place] In-house Referral: Clinical Social Work     Living arrangements for the past 2 months: Single Family Home                                       Social Determinants of Health (SDOH) Interventions    Readmission Risk Interventions No flowsheet data found.

## 2020-11-05 NOTE — Progress Notes (Signed)
Occupational Therapy Treatment Patient Details Name: Ariana [name] MRN: [id number] DOB: 30-Jan-1933 Today's Date: 11/05/2020    History of present illness Ariana [name] is a 87 y.o. female with medical history significant for ITP, Alzheimer's dementia, depression, hypothyroidism, anxiety disorder and asthma who arrives to the ER via EMS for evaluation of left hip pain following a fall.  Patient was walking her dog and slipped on ice, landing on her left side. S/p L ORIF on 10/31/20.   OT comments  Pt seen for OT tx this date. Pt pleasant, eager to participate with a little encouragement. Pt initially endorses abdominal discomfort (RN in room and aware) and agreeable to transfer to [facility] to attempt to see if that helps her stomach feel better. Pt required Min A for bed mobility, cues for hand placement/RW mgt during STS and SPT to [facility]. Pt urinated but unable to have BM. Pt performed grooming tasks with set up and occasional cues for terminating tasks while in sitting. Pt provided with wet wipe in standing and able to perform pericare with RUE while using LUE for stability on RW. No LOB noted. Max A for donning new tab style brief with slight LOB noted when pt reported L hip was hurting more. Once seated pt endorsed improvement in pain. On 2nd STS from [facility] pt noted with improved technique and decreased discomfort. No LOB. Pt performed SPT to EOB. Endorsed feeling slightly whoozy/weak and PTA entered for session with dynamap to check BP. Pt progressing well towards goals, continues to benefit from skilled OT Services to maximize return to PLOF. Note: Pt reported concern that her spouse may have Covid-19 and won't go to the doctor. Case mgt notified. Continue to recommend SNF at this time for skilled rehab prior to return home.   Follow Up Recommendations  SNF    Equipment Recommendations  3 in 1 bedside commode;Tub/shower bench    Recommendations for Other Services       Precautions / Restrictions Precautions Precautions: Fall Restrictions Weight Bearing Restrictions: Yes LLE Weight Bearing: Weight bearing as tolerated       Mobility Bed Mobility Overal bed mobility: Needs Assistance Bed Mobility: Supine to Sit     Supine to sit: Min assist        Transfers Overall transfer level: Needs assistance Equipment used: Rolling walker (2 wheeled) Transfers: Sit to/from [facility] Sit to Stand: Min assist Stand pivot transfers: Min assist       General transfer comment: verbal cues for hand placements    Balance Overall balance assessment: Needs assistance Sitting-balance support: No upper extremity supported;Feet supported Sitting balance-Leahy Scale: Fair Sitting balance - Comments: off weighting L hip occasionally for improved comfort but no LOB   Standing balance support: Bilateral upper extremity supported;During functional activity;Single extremity supported Standing balance-Leahy Scale: Fair Standing balance comment: Able to stand with LUE on RW and use RUE for pericare in standing wiht no LOB                           ADL either performed or assessed with clinical judgement   ADL Overall ADL's : Needs assistance/impaired     Grooming: Wash/dry hands;Wash/dry face;Oral care;Sitting;Set up;Supervision/safety;Cueing for sequencing                   Toilet Transfer: Minimal assistance;BSC;Stand-pivot;RW;Cueing for sequencing   Toileting- Clothing Manipulation and Hygiene: Set up;Min guard;Sit to/from stand;Maximal assistance Toileting - Clothing Manipulation Details (  indicate cue type and reason): set up and CGA for pericare, Max A for clothing mgt for donning tab style brief     Functional mobility during ADLs: Minimal assistance;Min guard;Cueing for sequencing;Rolling walker       Vision Baseline Vision/History: Wears glasses Wears Glasses: At all times Patient Visual Report: No change  from baseline     Perception     Praxis      Cognition Arousal/Alertness: Awake/alert Behavior During Therapy: WFL for tasks assessed/performed Overall Cognitive Status: History of cognitive impairments - at baseline                                 General Comments: pleasant, follows commands with cues        Exercises     Shoulder Instructions       General Comments      Pertinent Vitals/ Pain       Pain Assessment: Faces Faces Pain Scale: Hurts little more Pain Location: "medium amount of pain" Pain Descriptors / Indicators: Sore;Grimacing;Guarding Pain Intervention(s): Limited activity within patient's tolerance;Monitored during session;Repositioned;Patient requesting pain meds-RN notified  Home Living                                          Prior Functioning/Environment              Frequency  Min 2X/week        Progress Toward Goals  OT Goals(current goals can now be found in the care plan section)  Progress towards OT goals: Progressing toward goals  Acute Rehab OT Goals Patient Stated Goal: To return home safely OT Goal Formulation: With patient Time For Goal Achievement: 11/16/20 Potential to Achieve Goals: Good  Plan Discharge plan remains appropriate;Frequency remains appropriate    Co-evaluation                 AM-PAC OT "6 Clicks" Daily Activity     Outcome Measure   Help from another person eating meals?: None Help from another person taking care of personal grooming?: A Little Help from another person toileting, which includes using toliet, bedpan, or urinal?: A Lot Help from another person bathing (including washing, rinsing, drying)?: A Lot Help from another person to put on and taking off regular upper body clothing?: A Little Help from another person to put on and taking off regular lower body clothing?: A Lot 6 Click Score: 16    End of Session Equipment Utilized During Treatment:  Gait belt;Rolling walker  OT Visit Diagnosis: Other abnormalities of gait and mobility (R26.89);Muscle weakness (generalized) (M62.81)   Activity Tolerance Patient tolerated treatment well   Patient Left in bed;Other (comment) (seated EOB with PTA for session)   Nurse Communication Patient requests pain meds        Time: 9629-5284 OT Time Calculation (min): 31 min  Charges: OT General Charges $OT Visit: 1 Visit OT Treatments $Self Care/Home Management : 23-37 mins  Jeni Salles, MPH, MS, OTR/L ascom [phone] 11/05/20, 3:58 PM

## 2020-11-05 NOTE — Progress Notes (Signed)
Physical Therapy Treatment Patient Details Name: Ariana [name] MRN: [id number] DOB: 10/27/1932 Today's Date: 11/05/2020    History of Present Illness Ariana [name] is a 87 y.o. female with medical history significant for ITP, Alzheimer's dementia, depression, hypothyroidism, anxiety disorder and asthma who arrives to the ER via EMS for evaluation of left hip pain following a fall.  Patient was walking her dog and slipped on ice, landing on her left side. S/p L ORIF on 10/31/20.    PT Comments    Pt was seated EOB finishing OT upon author arriving. She agrees to PT session but does endorse 7/10 pain. RN aware and notified by OT. Pt is very cooperative and pleasant however does have cognition deficits that come to light during session. She refuses rehab at DC even with max encouragement. " I want to go home and do my rehab from there. I have dogs and a husband waiting on me." Pt was able to stand and ambulate with RW however fatigued quickly. Issued HEP/ther ex after ambulation and pt performed. Pt would benefit from rehab at DC due to the amount of assistance required for getting in/out of bed and standing however if pt does decide to DC home, recommend HHPT to follow.    Follow Up Recommendations  SNF     Equipment Recommendations  None recommended by PT    Recommendations for Other Services       Precautions / Restrictions Precautions Precautions: Fall Restrictions Weight Bearing Restrictions: Yes LLE Weight Bearing: Weight bearing as tolerated    Mobility  Bed Mobility Overal bed mobility: Needs Assistance Bed Mobility: Sit to Supine     Supine to sit: Min assist Sit to supine: Mod assist   General bed mobility comments: pt unable to progress LEs into bed without mod assist + incraesed time and cues  Transfers Overall transfer level: Needs assistance Equipment used: Rolling walker (2 wheeled) Transfers: Sit to/from Stand Sit to Stand: Min  guard;Min assist Stand pivot transfers: Min assist       General transfer comment: min assist on first attempt. CGA for 2nd and 3rd attempts  Ambulation/Gait Ambulation/Gait assistance: Min assist Gait Distance (Feet): 75 Feet Assistive device: Rolling walker (2 wheeled)   Gait velocity: decreased   General Gait Details: Pt was able to ambulate ~ 75 ft with slow antalgic gait however did not have LOB.       Balance Overall balance assessment: Needs assistance Sitting-balance support: No upper extremity supported;Feet supported Sitting balance-Leahy Scale: Fair Sitting balance - Comments: off weighting L hip occasionally for improved comfort but no LOB   Standing balance support: Bilateral upper extremity supported;During functional activity;Single extremity supported Standing balance-Leahy Scale: Fair Standing balance comment: Able to stand with LUE on RW and use RUE for pericare in standing wiht no LOB         Cognition Arousal/Alertness: Awake/alert Behavior During Therapy: WFL for tasks assessed/performed Overall Cognitive Status: History of cognitive impairments - at baseline        General Comments: Pt is alert and follows command sconsistently however has poor insight of deficits and unwilling to go to rehab at DC.             Pertinent Vitals/Pain Pain Assessment: 0-10 Pain Score: 7  Faces Pain Scale: Hurts little more Pain Location: "medium amount of pain" Pain Descriptors / Indicators: Sore;Grimacing;Guarding Pain Intervention(s): Limited activity within patient's tolerance;Monitored during session;Repositioned;Patient requesting pain meds-RN notified  PT Goals (current goals can now be found in the care plan section) Acute Rehab PT Goals Patient Stated Goal: To return home safely Progress towards PT goals: Progressing toward goals    Frequency    7X/week      PT Plan Current plan remains appropriate       AM-PAC PT "6 Clicks"  Mobility   Outcome Measure  Help needed turning from your back to your side while in a flat bed without using bedrails?: A Little Help needed moving from lying on your back to sitting on the side of a flat bed without using bedrails?: A Little Help needed moving to and from a bed to a chair (including a wheelchair)?: A Little Help needed standing up from a chair using your arms (e.g., wheelchair or bedside chair)?: A Little Help needed to walk in hospital room?: A Little Help needed climbing 3-5 steps with a railing? : A Lot 6 Click Score: 17    End of Session Equipment Utilized During Treatment: Gait belt Activity Tolerance: Patient tolerated treatment well Patient left: in bed;with call bell/phone within reach;with bed alarm set Nurse Communication: Mobility status PT Visit Diagnosis: Muscle weakness (generalized) (M62.81);Difficulty in walking, not elsewhere classified (R26.2);Pain Pain - Right/Left: Left Pain - part of body: Hip     Time: 4765-4650 PT Time Calculation (min) (ACUTE ONLY): 20 min  Charges:  $Gait Training: 8-22 mins                     Julaine Fusi PTA 11/05/20, 4:49 PM

## 2020-11-06 DIAGNOSIS — I10 Essential (primary) hypertension: Secondary | ICD-10-CM | POA: Diagnosis not present

## 2020-11-06 DIAGNOSIS — D693 Immune thrombocytopenic purpura: Secondary | ICD-10-CM | POA: Diagnosis not present

## 2020-11-06 DIAGNOSIS — S72002A Fracture of unspecified part of neck of left femur, initial encounter for closed fracture: Secondary | ICD-10-CM | POA: Diagnosis not present

## 2020-11-06 LAB — CBC
HCT: 26.6 % — ABNORMAL LOW (ref 36.0–46.0)
Hemoglobin: 8.8 g/dL — ABNORMAL LOW (ref 12.0–15.0)
MCH: 28.6 pg (ref 26.0–34.0)
MCHC: 33.1 g/dL (ref 30.0–36.0)
MCV: 86.4 fL (ref 80.0–100.0)
Platelets: 203 10*3/uL (ref 150–400)
RBC: 3.08 MIL/uL — ABNORMAL LOW (ref 3.87–5.11)
RDW: 19.1 % — ABNORMAL HIGH (ref 11.5–15.5)
WBC: 11.7 10*3/uL — ABNORMAL HIGH (ref 4.0–10.5)
nRBC: 0.2 % (ref 0.0–0.2)

## 2020-11-06 LAB — BASIC METABOLIC PANEL
Anion gap: 8 (ref 5–15)
BUN: 29 mg/dL — ABNORMAL HIGH (ref 8–23)
CO2: 28 mmol/L (ref 22–32)
Calcium: 8.8 mg/dL — ABNORMAL LOW (ref 8.9–10.3)
Chloride: 102 mmol/L (ref 98–111)
Creatinine, Ser: 0.86 mg/dL (ref 0.44–1.00)
GFR, Estimated: >60 mL/min (ref >60)
Glucose, Bld: 87 mg/dL (ref 70–99)
Potassium: 4 mmol/L (ref 3.5–5.1)
Sodium: 138 mmol/L (ref 135–145)

## 2020-11-06 NOTE — Progress Notes (Signed)
Nutrition Follow-up  DOCUMENTATION CODES:   Not applicable  INTERVENTION:  Continue Ensure Enlive po BID, each supplement provides 350 kcal and 20 grams of protein  Continue Ocuvite-Lutein po daily to support wound healing   NUTRITION DIAGNOSIS:   Increased nutrient needs related to post-op healing as evidenced by estimated needs. -ongoing  GOAL:   Patient will meet greater than or equal to 90% of their needs -progressing  MONITOR:   Weight trends,Labs,I & O's,Diet advancement,Supplement acceptance,Skin,PO intake  REASON FOR ASSESSMENT:   Consult Assessment of nutrition requirement/status (hip fx)  ASSESSMENT:   87 year old female admitted for left hip fracture slipping on ice at home resulting in mechanical fall. Past medical history significant of ITP, Alzheimer's dementia, depression, hypothyroidism, anxiety disorder, and asthma, HLD, osteoarthritis, GERD, and LBBB.  RD working remotely.  Pt appetite and intake have improved, eating >50% of meals and has accepted 11 of 12 Ensure supplements this admission. She consumed 85% of breakfast, 75% of lunch today and has accepted 2 Ensure supplements (~1662 kcal, 67 grams protein). Expect pt to meet >100% of needs today.   I/Os: +2577.5 ml since admit  No new weights to trend at this time  Pt is medically stable to d/c to SNF, awaiting placement.  Medications reviewed and include: Oscal with D, D3, Colace, Celexa, Aricept, Gabapentin, Namenda, Ocuvite-Lutein, Mag-ox, Senokot, B12, Vit E  Labs: BUN 29 (H), WBC 11.7 (H)   Diet Order:   Diet Order            Diet regular Room service appropriate? Yes; Fluid consistency: Thin  Diet effective now                 EDUCATION NEEDS:   Education needs have been addressed  Skin:  Skin Assessment: Skin Integrity Issues: Skin Integrity Issues:: Incisions Incisions: closed; left hip  Last BM:  1/24 - type 3 (brown;medium)  Height:   Ht Readings from Last 1  Encounters:  10/31/20 5\' 2"  (1.575 m)    Weight:   Wt Readings from Last 1 Encounters:  10/31/20 67.1 kg    BMI:  Body mass index is 27.06 kg/m.  Estimated Nutritional Needs:   Kcal:  1650-1850  Protein:  76-90  Fluid:  >1.4 L   Lajuan Lines, RD, LDN Clinical Nutrition After Hours/Weekend Pager # in [place]

## 2020-11-06 NOTE — TOC Progression Note (Signed)
Transition of Care [facility]) - Progression Note    Patient Details  Name: Ariana [name] MRN: [id number] Date of Birth: 06/22/1933  Transition of Care [facility]) CM/SW [place], RN Phone Number: 11/06/2020, 10:38 AM  Clinical Narrative:   RNCM called to room by PT. Patient's husband in room and now is agreeable to SNF. Patient is sitting up in bed alert and answering questions appropriately. She is requesting to go to [facility]. Husband relays that if they do not have a bed she may be agreeable to another facility but he will not allow her to go to [facility]. RNCM verified PASSR, completed FL2, and sent bed request to [facility].     Expected Discharge Plan: [place] Barriers to Discharge: Continued Medical Work up  Expected Discharge Plan and Services Expected Discharge Plan: [place] In-house Referral: Clinical Social Work     Living arrangements for the past 2 months: Single Family Home                                       Social Determinants of Health (SDOH) Interventions    Readmission Risk Interventions No flowsheet data found.

## 2020-11-06 NOTE — Progress Notes (Addendum)
PROGRESS NOTE    Ariana [name]  CWC:[id number] DOB: 1933/02/16 DOA: 10/30/2020 PCP: Idelle Crouch, MD   Assessment & Plan:   Principal Problem:   Closed displaced fracture of left femoral neck ([place]) Active Problems:   Acute ITP ([place])   Acquired hypothyroidism   Depressive disorder   Benign essential hypertension   Late onset Alzheimer's disease without behavioral disturbance (HCC)   Idiopathic thrombocythemia ([place])   Hiatal hernia   GERD (gastroesophageal reflux disease)   Palliative care encounter  Closed displaced fracture of left femoral neck: secondary to mechanical fall. S/p ORIF on 10/31/20 as per ortho surg. PT/OT recs SNF  Idiopathic thrombocytopenic purpura: chronic. S/p Nplate x 1, 2 units of platelets, IVIG x 2 doses as per heme.   HTN: continue on amlodipine   Hypothyroidism: continue on levothyroxine   Alzheimer's dementia:  Continue on aricept, namenda   Anxiety: severity unknown. Continue on home dose of alprazolam.   Depression: severity unknown. Continue on home dose of wellbutrin, buspar   Hernia with GERD: continue on PPI    DVT prophylaxis: lovenox Code Status: DNR Family Communication: discussed pt's care w/ pt's husband, Ray, and answered his questions  Disposition Plan: SNF   Status is: Inpatient  Remains inpatient appropriate because:Unsafe d/c plan   Dispo: The patient is from: Home              Anticipated d/c is to: SNF              Anticipated d/c date is: 2 days              Patient currently is medically stable to d/c.   Difficult to place patient Yes    Consultants:   Palliative care  Ortho surg   Procedures:  Antimicrobials:  Subjective: Pt c/o hip pain still  Objective: Vitals:   11/05/20 1132 11/05/20 1621 11/05/20 2016 11/06/20 0445  BP: (!) 137/58 (!) 138/58 99/83 (!) 120/52  Pulse: (!) 56 76 70 63  Resp: 15 16 15 15   Temp: 97.7 F (36.5 C) (!) 97.5 F (36.4 C) 98.6 F (37 C) 98.3  F (36.8 C)  TempSrc:  Oral Oral   SpO2: 100% 99% 96% 97%  Weight:      Height:        Intake/Output Summary (Last 24 hours) at 11/06/2020 0738 Last data filed at 11/05/2020 1407 Gross per 24 hour  Intake 120 ml  Output -  Net 120 ml   Filed Weights   10/30/20 0846 10/31/20 1136  Weight: 67.1 kg 67.1 kg    Examination:  General exam: Appears comfortable Respiratory system: clear breath sounds b/l  Cardiovascular system: S1/S2+. No gallops or clicks Gastrointestinal system: Abd is soft, NT, ND & hypoactive bowel sounds  Central nervous system: Alert & awake. Moves all 4 extremities  Psychiatry: Judgement and insight appear abnormal. Flat mood and affect    Data Reviewed: I have personally reviewed following labs and imaging studies  CBC: Recent Labs  Lab 10/31/20 1057 11/01/20 0346 11/02/20 1558 11/02/20 2251 11/03/20 0544 11/04/20 0325 11/05/20 0612  WBC 13.2*   < > 14.5* 15.1* 10.7* 11.4* 11.1*  NEUTROABS 10.2*  --   --   --   --   --   --   HGB 11.3*   < > 8.8* 7.7* 8.2* 8.9* 8.7*  HCT 35.1*   < > 27.0* 24.0* 25.6* 26.5* 26.7*  MCV 86.9   < > 87.7 87.3 87.7  84.7 86.1  PLT 18*   < > 9* 27* 33* 105* 130*   < > = values in this interval not displayed.   Basic Metabolic Panel: Recent Labs  Lab 11/02/20 0554 11/03/20 0544 11/04/20 0325 11/05/20 0612 11/06/20 0528  NA 140 141 134* 134* 138  K 4.2 4.0 3.8 3.9 4.0  CL 105 104 98 96* 102  CO2 29 30 28 29 28   GLUCOSE 104* 77 85 104* 87  BUN 28* 24* 20 32* 29*  CREATININE 0.97 0.80 0.75 0.98 0.86  CALCIUM 8.8* 8.6* 9.0 8.9 8.8*  MG  --  1.8 1.7 2.0  --   PHOS  --  3.4 4.0 5.0*  --    GFR: Estimated Creatinine Clearance: 43 mL/min (by C-G formula based on SCr of 0.86 mg/dL). Liver Function Tests: Recent Labs  Lab 10/30/20 0850  AST 30  ALT 14  ALKPHOS 90  BILITOT 0.5  PROT 6.1*  ALBUMIN 3.7   No results for input(s): LIPASE, AMYLASE in the last 168 hours. No results for input(s): AMMONIA in the  last 168 hours. Coagulation Profile: Recent Labs  Lab 10/30/20 0935  INR 1.1   Cardiac Enzymes: No results for input(s): CKTOTAL, CKMB, CKMBINDEX, TROPONINI in the last 168 hours. BNP (last 3 results) No results for input(s): PROBNP in the last 8760 hours. HbA1C: No results for input(s): HGBA1C in the last 72 hours. CBG: Recent Labs  Lab 11/04/20 0902  GLUCAP 74   Lipid Profile: No results for input(s): CHOL, HDL, LDLCALC, TRIG, CHOLHDL, LDLDIRECT in the last 72 hours. Thyroid Function Tests: No results for input(s): TSH, T4TOTAL, FREET4, T3FREE, THYROIDAB in the last 72 hours. Anemia Panel: No results for input(s): VITAMINB12, FOLATE, FERRITIN, TIBC, IRON, RETICCTPCT in the last 72 hours. Sepsis Labs: No results for input(s): PROCALCITON, LATICACIDVEN in the last 168 hours.  Recent Results (from the past 240 hour(s))  Resp Panel by RT-PCR (Flu A&B, Covid) Nasopharyngeal Swab     Status: None   Collection Time: 10/30/20  9:35 AM   Specimen: Nasopharyngeal Swab; Nasopharyngeal(NP) swabs in vial transport medium  Result Value Ref Range Status   SARS Coronavirus 2 by RT PCR NEGATIVE NEGATIVE Final    Comment: (NOTE) SARS-CoV-2 target nucleic acids are NOT DETECTED.  The SARS-CoV-2 RNA is generally detectable in upper respiratory specimens during the acute phase of infection. The lowest concentration of SARS-CoV-2 viral copies this assay can detect is 138 copies/mL. A negative result does not preclude SARS-Cov-2 infection and should not be used as the sole basis for treatment or other patient management decisions. A negative result may occur with  improper specimen collection/handling, submission of specimen other than nasopharyngeal swab, presence of viral mutation(s) within the areas targeted by this assay, and inadequate number of viral copies(<138 copies/mL). A negative result must be combined with clinical observations, patient history, and  epidemiological information. The expected result is Negative.  Fact Sheet for Patients:  EntrepreneurPulse.com.au  Fact Sheet for Healthcare Providers:  IncredibleEmployment.be  This test is no t yet approved or cleared by the [place] FDA and  has been authorized for detection and/or diagnosis of SARS-CoV-2 by FDA under an Emergency Use Authorization (EUA). This EUA will remain  in effect (meaning this test can be used) for the duration of the COVID-19 declaration under Section 564(b)(1) of the Act, 21 U.S.C.section 360bbb-3(b)(1), unless the authorization is terminated  or revoked sooner.       Influenza A by PCR NEGATIVE NEGATIVE  Final   Influenza B by PCR NEGATIVE NEGATIVE Final    Comment: (NOTE) The Xpert Xpress SARS-CoV-2/FLU/RSV plus assay is intended as an aid in the diagnosis of influenza from Nasopharyngeal swab specimens and should not be used as a sole basis for treatment. Nasal washings and aspirates are unacceptable for Xpert Xpress SARS-CoV-2/FLU/RSV testing.  Fact Sheet for Patients: EntrepreneurPulse.com.au  Fact Sheet for Healthcare Providers: IncredibleEmployment.be  This test is not yet approved or cleared by the [place] FDA and has been authorized for detection and/or diagnosis of SARS-CoV-2 by FDA under an Emergency Use Authorization (EUA). This EUA will remain in effect (meaning this test can be used) for the duration of the COVID-19 declaration under Section 564(b)(1) of the Act, 21 U.S.C. section 360bbb-3(b)(1), unless the authorization is terminated or revoked.  Performed at [facility], [address] [place], [place] [address]   Surgical pcr screen     Status: Abnormal   Collection Time: 10/31/20 10:14 AM   Specimen: Nasal Mucosa; Nasal Swab  Result Value Ref Range Status   MRSA, PCR POSITIVE (A) NEGATIVE Final    Comment: RESULT CALLED TO, READ  BACK BY AND VERIFIED WITH: DANA LINONS 10/31/20 1210 KLW    Staphylococcus aureus POSITIVE (A) NEGATIVE Final    Comment: (NOTE) The Xpert SA Assay (FDA approved for NASAL specimens in patients 32 years of age and older), is one component of a comprehensive surveillance program. It is not intended to diagnose infection nor to guide or monitor treatment. Performed at [facility], [address] [place], [place] [address]          Radiology Studies: CT HEAD WO CONTRAST  Result Date: 11/04/2020 CLINICAL DATA:  Headache. Intracranial hemorrhage suspected. History of hip surgery. EXAM: CT HEAD WITHOUT CONTRAST TECHNIQUE: Contiguous axial images were obtained from the base of the skull through the vertex without intravenous contrast. COMPARISON:  03/18/2020 FINDINGS: Brain: Mild cerebral atrophy is unchanged. Again noted is diffuse low-density in the periventricular and subcortical white matter. White matter changes are stable. Negative for acute hemorrhage, mass lesion, midline shift, hydrocephalus or large infarct. Vascular: No hyperdense vessel or unexpected calcification. Skull: Normal. Negative for fracture or focal lesion. Sinuses/Orbits: Small amount of fluid in the posterior right mastoid air cells. Minimal mucosal disease in the right anterior ethmoid air cells. Other: None IMPRESSION: 1. No acute intracranial abnormality. 2. Stable white matter changes that likely represent chronic small vessel ischemic disease. 3. Minimal sinus disease. Electronically Signed   By: Markus Daft M.D.   On: 11/04/2020 09:44        Scheduled Meds: . acetaminophen  1,000 mg Oral Q8H  . amLODipine  5 mg Oral Daily  . vitamin C  500 mg Oral Daily  . benzonatate  200 mg Oral TID  . buPROPion  150 mg Oral Daily  . busPIRone  15 mg Oral BID  . calcium-vitamin D  1 tablet Oral Daily  . Chlorhexidine Gluconate Cloth  6 each Topical Q0600  . cholecalciferol  2,000 Units Oral Daily  . citalopram   20 mg Oral BH-q7a  . docusate sodium  100 mg Oral BID  . donepezil  10 mg Oral BID  . enoxaparin (LOVENOX) injection  40 mg Subcutaneous QPM  . feeding supplement  237 mL Oral BID BM  . gabapentin  300 mg Oral QID  . lamoTRIgine  50 mg Oral QHS  . levothyroxine  50 mcg Oral QAC breakfast  . magnesium oxide  400 mg Oral BID  . melatonin  6 mg Oral QPM  . memantine  10 mg Oral BID  . montelukast  10 mg Oral QHS  . multivitamin-lutein  1 capsule Oral BID  . mupirocin ointment   Nasal BID  . oxybutynin  5 mg Oral TID  . pantoprazole  20 mg Oral BID  . pravastatin  20 mg Oral QHS  . senna  1 tablet Oral BID  . vitamin B-12  1,000 mcg Oral Daily  . vitamin E  400 Units Oral Daily   Continuous Infusions: . sodium chloride Stopped (11/04/20 0315)  . methocarbamol (ROBAXIN) IV       LOS: 7 days    Time spent: 33 mins     Wyvonnia Dusky, MD [facility] Pager [phone]  If 7PM-7AM, please contact night-coverage 11/06/2020, 7:38 AM

## 2020-11-06 NOTE — Progress Notes (Signed)
Physical Therapy Treatment Patient Details Name: Ariana [name] MRN: [id number] DOB: 05-20-1933 Today's Date: 11/06/2020    History of Present Illness Ariana [name] is a 87 y.o. female with medical history significant for ITP, Alzheimer's dementia, depression, hypothyroidism, anxiety disorder and asthma who arrives to the ER via EMS for evaluation of left hip pain following a fall.  Patient was walking her dog and slipped on ice, landing on her left side. S/p L ORIF on 10/31/20.    PT Comments    Pt was long sitting in bed with supportive spouse at bedside. She agrees to session and is cooperative and pleasant throughout. Does have baseline cognition deficits. Lengthy discussion between pt and spouse about dc disposition and skilled needs. Discussed amount of care pt will require. Both pt/pt's spouse agree that best/safest option would be to DC to STR. Pt continues to require assistance with all mobility/transfers/and gait. Pt would greatly benefit from short rehab stay + continued skilled PT to address these deficits prior to returning home. Pt overall tolerated session well. Will continue to follow per POC and progress as able per pt tolerance.    Follow Up Recommendations  SNF;Other (comment) (pt/pt's spouse agreeable and prefer [facility])     Equipment Recommendations  None recommended by PT    Recommendations for Other Services       Precautions / Restrictions Precautions Precautions: Fall Restrictions Weight Bearing Restrictions: Yes LLE Weight Bearing: Weight bearing as tolerated    Mobility  Bed Mobility Overal bed mobility: Needs Assistance Bed Mobility: Supine to Sit;Rolling;Sit to Supine Rolling: Min assist   Supine to sit: Min assist Sit to supine: Min assist   General bed mobility comments: Pt was able to exit L side of bed with increased time and min assist. pt used bed rail and needs vcs thrpoughout for improved  technique  Transfers Overall transfer level: Needs assistance Equipment used: Rolling walker (2 wheeled) Transfers: Sit to/from Stand Sit to Stand: Min guard;Min assist         General transfer comment: CGA from elevated surface however min assist from lowest bed height.  Ambulation/Gait Ambulation/Gait assistance: Min assist Gait Distance (Feet): 70 Feet Assistive device: Rolling walker (2 wheeled) Gait Pattern/deviations: Step-to pattern;Drifts right/left;Staggering left;Shuffle;Narrow base of support Gait velocity: decreased   General Gait Details: Pt tends to ambulate with narrow BOS and outside RW. needs constant vcs for improved safety/technique. Pt has carryover between cues. Had one occasion of LOB with intervention to prevent fall.      Balance Overall balance assessment: Needs assistance Sitting-balance support: No upper extremity supported;Feet supported Sitting balance-Leahy Scale: Fair     Standing balance support: Bilateral upper extremity supported;During functional activity;Single extremity supported Standing balance-Leahy Scale: Fair       Cognition Arousal/Alertness: Awake/alert Behavior During Therapy: WFL for tasks assessed/performed Overall Cognitive Status: History of cognitive impairments - at baseline        General Comments: Pt is alert and oriented x 3. Able to follow commands consistently throughout             Pertinent Vitals/Pain Pain Assessment: 0-10 Pain Score: 7  Faces Pain Scale: Hurts a little bit Pain Location: L hip Pain Descriptors / Indicators: Sore;Grimacing;Guarding Pain Intervention(s): Limited activity within patient's tolerance;Monitored during session;Premedicated before session;Repositioned           PT Goals (current goals can now be found in the care plan section) Acute Rehab PT Goals Patient Stated Goal: To return home safely  to see my dogs Progress towards PT goals: Progressing toward goals     Frequency    7X/week      PT Plan Current plan remains appropriate       AM-PAC PT "6 Clicks" Mobility   Outcome Measure  Help needed turning from your back to your side while in a flat bed without using bedrails?: A Little Help needed moving from lying on your back to sitting on the side of a flat bed without using bedrails?: A Little Help needed moving to and from a bed to a chair (including a wheelchair)?: A Little Help needed standing up from a chair using your arms (e.g., wheelchair or bedside chair)?: A Little Help needed to walk in hospital room?: A Little Help needed climbing 3-5 steps with a railing? : A Lot 6 Click Score: 17    End of Session Equipment Utilized During Treatment: Gait belt Activity Tolerance: Patient tolerated treatment well Patient left: in bed;with call bell/phone within reach;with bed alarm set Nurse Communication: Mobility status PT Visit Diagnosis: Muscle weakness (generalized) (M62.81);Difficulty in walking, not elsewhere classified (R26.2);Pain Pain - Right/Left: Left Pain - part of body: Hip     Time: 3545-6256 PT Time Calculation (min) (ACUTE ONLY): 18 min  Charges:  $Therapeutic Activity: 8-22 mins                     Julaine Fusi PTA 11/06/20, 10:42 AM

## 2020-11-06 NOTE — Progress Notes (Signed)
  Subjective: 6 Days Post-Op Procedure(s) (LRB): CANNULATED HIP PINNING (Left) Patient reports pain as mild.   Patient is well, and has had no acute complaints or problems Plan is to go home versus Rehab after hospital stay. Negative for chest pain and shortness of breath Fever: no Gastrointestinal: Negative for nausea and vomiting  Objective: Vital signs in last 24 hours: Temp:  [97.5 F (36.4 C)-98.6 F (37 C)] 98.3 F (36.8 C) (01/26 0445) Pulse Rate:  [55-76] 63 (01/26 0445) Resp:  [15-16] 15 (01/26 0445) BP: (99-138)/(52-83) 120/52 (01/26 0445) SpO2:  [96 %-100 %] 97 % (01/26 0445)  Intake/Output from previous day:  Intake/Output Summary (Last 24 hours) at 11/06/2020 0706 Last data filed at 11/05/2020 1407 Gross per 24 hour  Intake 120 ml  Output -  Net 120 ml    Intake/Output this shift: No intake/output data recorded.  Labs: Recent Labs    11/04/20 0325 11/05/20 0612  HGB 8.9* 8.7*   Recent Labs    11/04/20 0325 11/05/20 0612  WBC 11.4* 11.1*  RBC 3.13* 3.10*  HCT 26.5* 26.7*  PLT 105* 130*   Recent Labs    11/05/20 0612 11/06/20 0528  NA 134* 138  K 3.9 4.0  CL 96* 102  CO2 29 28  BUN 32* 29*  CREATININE 0.98 0.86  GLUCOSE 104* 87  CALCIUM 8.9 8.8*   No results for input(s): LABPT, INR in the last 72 hours.   EXAM General - Patient is Alert and Confused Extremity - Neurovascular intact Sensation intact distally Dorsiflexion/Plantar flexion intact Compartment soft Dressing/Incision - clean, dry, scant drainage with positive hematoma surrounding left thigh incision.  Hematoma is diminishing. Motor Function - intact, moving foot and toes well on exam.  Ambulated 75 feet with physical therapy.  Past Medical History:  Diagnosis Date  . Alzheimer disease ([place])   . Anemia   . Anxiety   . Asthma    WELL CONTROLLED  . Chronic back pain   . Depression   . Depression   . GERD (gastroesophageal reflux disease)   . Hiatal hernia   .  Hypercholesteremia   . Hypothyroidism   . ITP (idiopathic thrombocytopenic purpura)    FOLLOWED BR DR Grayland Ormond  . LBBB (left bundle branch block)   . Osteoarthritis   . Osteoporosis   . Restless legs     Assessment/Plan: 6 Days Post-Op Procedure(s) (LRB): CANNULATED HIP PINNING (Left) Principal Problem:   Closed displaced fracture of left femoral neck (HCC) Active Problems:   Acute ITP (HCC)   Acquired hypothyroidism   Depressive disorder   Benign essential hypertension   Late onset Alzheimer's disease without behavioral disturbance (HCC)   Idiopathic thrombocythemia (HCC)   Hiatal hernia   GERD (gastroesophageal reflux disease)   Palliative care encounter  Estimated body mass index is 27.06 kg/m as calculated from the following:   Height as of this encounter: 5\' 2"  (1.575 m).   Weight as of this encounter: 67.1 kg. Advance diet Up with therapy  Platelet count 130 this morning.  Oncology and internal medicine are following.  Appreciated.  Acute blood loss anemia with hemoglobin 8.5, but stable.  Discharge planning with follow-up to [facility] clinic in 2 weeks.  Orthopedics will sign off for now.  Stable orthopedically.  Please let us know if there is any question.  DVT Prophylaxis - Lovenox, Foot Pumps and TED hose Weight-Bearing as tolerated to left leg  Reche Dixon, PA-C Orthopaedic Surgery 11/06/2020, 7:06 AM

## 2020-11-06 NOTE — Progress Notes (Signed)
Ariana [name]  [phone] [phone]) [phone]  ID: Ariana [name] OB: Jan 30, 1933  MR#: [id number]  WNU#:[id number]  Patient Care Team: Idelle Crouch, MD as PCP - General (Internal Medicine) Lloyd Huger, MD as Consulting Physician (Oncology)  CHIEF COMPLAINT: Chronic ITP, left femoral neck fracture.  INTERVAL HISTORY: Patient remains confused and only complains of soreness in her left hip.  Family not at bedside.  REVIEW OF SYSTEMS:   Review of Systems  Unable to perform ROS: Dementia  Musculoskeletal: Positive for joint pain.    PAST MEDICAL HISTORY: Past Medical History:  Diagnosis Date  . Alzheimer disease ([place])   . Anemia   . Anxiety   . Asthma    WELL CONTROLLED  . Chronic back pain   . Depression   . Depression   . GERD (gastroesophageal reflux disease)   . Hiatal hernia   . Hypercholesteremia   . Hypothyroidism   . ITP (idiopathic thrombocytopenic purpura)    FOLLOWED BR DR Grayland Ormond  . LBBB (left bundle branch block)   . Osteoarthritis   . Osteoporosis   . Restless legs     PAST SURGICAL HISTORY: Past Surgical History:  Procedure Laterality Date  . CARPAL TUNNEL RELEASE    . ESOPHAGOGASTRODUODENOSCOPY (EGD) WITH PROPOFOL N/A 09/10/2015   Procedure: ESOPHAGOGASTRODUODENOSCOPY (EGD) WITH PROPOFOL;  Surgeon: Lollie Sails, MD;  Location: [facility] ENDOSCOPY;  Service: Endoscopy;  Laterality: N/A;  . HIP PINNING,CANNULATED Left 10/31/2020   Procedure: CANNULATED HIP PINNING;  Surgeon: Leim Fabry, MD;  Location: [facility] ORS;  Service: Orthopedics;  Laterality: Left;  . IRRIGATION AND DEBRIDEMENT HEMATOMA Right 07/13/2018   Procedure: IRRIGATION AND DEBRIDEMENT HEMATOMA-RIGHT SHIN;  Surgeon: Herbert Pun, MD;  Location: [facility] ORS;  Service: General;  Laterality: Right;  . KYPHOPLASTY N/A 05/02/2019   Procedure: L1 KYPHOPLASTY;  Surgeon: Hessie Knows, MD;  Location: [facility] ORS;  Service: Orthopedics;   Laterality: N/A;  . LUMBAR [place]    . REVERSE SHOULDER ARTHROPLASTY Left 06/25/2020   Procedure: REVERSE SHOULDER ARTHROPLASTY;  Surgeon: Corky Mull, MD;  Location: [facility] ORS;  Service: Orthopedics;  Laterality: Left;  . SPLENECTOMY, PARTIAL    . TOTAL ABDOMINAL HYSTERECTOMY      FAMILY HISTORY: Family History  Problem Relation Age of Onset  . Stroke Mother     ADVANCED DIRECTIVES (Y/N):  @ADVDIR @  HEALTH MAINTENANCE: Social History   Tobacco Use  . Smoking status: Never Smoker  . Smokeless tobacco: Never Used  Vaping Use  . Vaping Use: Never used  Substance Use Topics  . Alcohol use: No    Alcohol/week: 0.0 standard drinks  . Drug use: No     Colonoscopy:  PAP:  Bone density:  Lipid panel:  Allergies  Allergen Reactions  . Aspirin     Upset stomach  . Diazepam Itching    sneezing  . Other     seasonal allergies  . Tetanus Toxoids     Localized superficial swelling of skin  . Morphine Itching and Rash    Current Facility-Administered Medications  Medication Dose Route Frequency Provider Last Rate Last Admin  . 0.9 %  sodium chloride infusion   Intravenous Continuous Leim Fabry, MD   Stopped at 11/04/20 0315  . acetaminophen (TYLENOL) tablet 1,000 mg  1,000 mg Oral Q8H Leim Fabry, MD   1,000 mg at 11/06/20 0547  . albuterol (VENTOLIN HFA) 108 (90 Base) MCG/ACT inhaler 2 puff  2 puff Inhalation Q6H PRN Leim Fabry, MD      .  ALPRAZolam Duanne Moron) tablet 0.5 mg  0.5 mg Oral TID PRN Leim Fabry, MD   0.5 mg at 11/06/20 1037  . amLODipine (NORVASC) tablet 5 mg  5 mg Oral Daily Val Riles, MD   5 mg at 11/06/20 1038  . ascorbic acid (VITAMIN C) tablet 500 mg  500 mg Oral Daily Leim Fabry, MD   500 mg at 11/06/20 1039  . benzonatate (TESSALON) capsule 200 mg  200 mg Oral TID Leim Fabry, MD   200 mg at 11/06/20 1037  . bisacodyl (DULCOLAX) suppository 10 mg  10 mg Rectal Daily PRN Leim Fabry, MD      . buPROPion (WELLBUTRIN XL) 24 hr tablet 150 mg   150 mg Oral Daily Leim Fabry, MD   150 mg at 11/06/20 1038  . busPIRone (BUSPAR) tablet 15 mg  15 mg Oral BID Leim Fabry, MD   15 mg at 11/06/20 1039  . calcium-vitamin D (OSCAL WITH D) 500-200 MG-UNIT per tablet 1 tablet  1 tablet Oral Daily Leim Fabry, MD   1 tablet at 11/06/20 1038  . carboxymethylcellul-glycerin (REFRESH OPTIVE) 0.5-0.9 % ophthalmic solution 1 drop  1 drop Both Eyes QID PRN Leim Fabry, MD      . Chlorhexidine Gluconate Cloth 2 % PADS 6 each  6 each Topical Q0600 Val Riles, MD   6 each at 11/06/20 0547  . cholecalciferol (VITAMIN D3) tablet 2,000 Units  2,000 Units Oral Daily Leim Fabry, MD   2,000 Units at 11/06/20 1039  . citalopram (CELEXA) tablet 20 mg  20 mg Oral Chong Sicilian, Tarry Kos, MD   20 mg at 11/06/20 1037  . docusate sodium (COLACE) capsule 100 mg  100 mg Oral BID Leim Fabry, MD   100 mg at 11/06/20 1039  . donepezil (ARICEPT) tablet 10 mg  10 mg Oral BID Leim Fabry, MD   10 mg at 11/06/20 1039  . enoxaparin (LOVENOX) injection 40 mg  40 mg Subcutaneous QPM Val Riles, MD   40 mg at 11/05/20 1710  . feeding supplement (ENSURE ENLIVE / ENSURE PLUS) liquid 237 mL  237 mL Oral BID BM Leim Fabry, MD   237 mL at 11/06/20 1036  . fluticasone (FLONASE) 50 MCG/ACT nasal spray 2 spray  2 spray Each Nare Daily PRN Leim Fabry, MD      . gabapentin (NEURONTIN) capsule 300 mg  300 mg Oral QID Leim Fabry, MD   300 mg at 11/06/20 1038  . guaiFENesin-dextromethorphan (ROBITUSSIN DM) 100-10 MG/5ML syrup 5 mL  5 mL Oral Q4H PRN Lang Snow, NP   5 mL at 11/05/20 0445  . HYDROmorphone (DILAUDID) injection 0.25-0.5 mg  0.25-0.5 mg Intravenous Q2H PRN Leim Fabry, MD      . lamoTRIgine (LAMICTAL) tablet 50 mg  50 mg Oral QHS Leim Fabry, MD   50 mg at 11/05/20 2223  . levothyroxine (SYNTHROID) tablet 50 mcg  50 mcg Oral QAC breakfast Leim Fabry, MD   50 mcg at 11/06/20 0546  . magnesium oxide (MAG-OX) tablet 400 mg  400 mg Oral BID Val Riles,  MD   400 mg at 11/06/20 1037  . melatonin tablet 6 mg  6 mg Oral QPM Val Riles, MD   6 mg at 11/05/20 1709  . memantine (NAMENDA) tablet 10 mg  10 mg Oral BID Leim Fabry, MD   10 mg at 11/06/20 1038  . methocarbamol (ROBAXIN) tablet 500 mg  500 mg Oral Q6H PRN Leim Fabry, MD   500 mg  at 11/05/20 0945   Or  . methocarbamol (ROBAXIN) 500 mg in dextrose 5 % 50 mL IVPB  500 mg Intravenous Q6H PRN Leim Fabry, MD      . metoCLOPramide (REGLAN) tablet 5-10 mg  5-10 mg Oral Q8H PRN Leim Fabry, MD       Or  . metoCLOPramide (REGLAN) injection 5-10 mg  5-10 mg Intravenous Q8H PRN Leim Fabry, MD      . montelukast (SINGULAIR) tablet 10 mg  10 mg Oral QHS Leim Fabry, MD   10 mg at 11/04/20 2215  . multivitamin-lutein (OCUVITE-LUTEIN) capsule 1 capsule  1 capsule Oral BID Leim Fabry, MD   1 capsule at 11/06/20 1036  . mupirocin ointment (BACTROBAN) 2 %   Nasal BID Val Riles, MD   1 application at 53/66/44 1050  . ondansetron (ZOFRAN) tablet 4 mg  4 mg Oral Q6H PRN Leim Fabry, MD       Or  . ondansetron [facility]) injection 4 mg  4 mg Intravenous Q6H PRN Leim Fabry, MD   4 mg at 11/03/20 2252  . oxybutynin (DITROPAN) tablet 5 mg  5 mg Oral TID Leim Fabry, MD   5 mg at 11/06/20 1036  . oxyCODONE (Oxy IR/ROXICODONE) immediate release tablet 2.5-5 mg  2.5-5 mg Oral Q3H PRN Leim Fabry, MD   5 mg at 11/05/20 2334  . oxyCODONE (Oxy IR/ROXICODONE) immediate release tablet 5-10 mg  5-10 mg Oral Q4H PRN Leim Fabry, MD   10 mg at 11/06/20 1101  . pantoprazole (PROTONIX) EC tablet 20 mg  20 mg Oral BID Leim Fabry, MD   20 mg at 11/06/20 1037  . pravastatin (PRAVACHOL) tablet 20 mg  20 mg Oral QHS Leim Fabry, MD   20 mg at 11/05/20 2223  . senna (SENOKOT) tablet 8.6 mg  1 tablet Oral BID Leim Fabry, MD   8.6 mg at 11/06/20 1050  . senna-docusate (Senokot-S) tablet 1 tablet  1 tablet Oral QHS PRN Leim Fabry, MD      . sodium phosphate (FLEET) 7-19 GM/118ML enema 1 enema  1 enema  Rectal Once PRN Leim Fabry, MD      . traMADol Veatrice Bourbon) tablet 50 mg  50 mg Oral Q6H PRN Leim Fabry, MD   50 mg at 11/05/20 0347  . vitamin B-12 (CYANOCOBALAMIN) tablet 1,000 mcg  1,000 mcg Oral Daily Leim Fabry, MD   1,000 mcg at 11/06/20 1038  . vitamin E capsule 400 Units  400 Units Oral Daily Leim Fabry, MD   400 Units at 11/06/20 1036    OBJECTIVE: Vitals:   11/06/20 0827 11/06/20 1120  BP: (!) 157/60 (!) 154/55  Pulse: 67 61  Resp: 16 16  Temp: 98.5 F (36.9 C) 98.2 F (36.8 C)  SpO2: 98% 100%     Body mass index is 27.06 kg/m.    ECOG FS:4 - Bedbound  General: Well-developed, well-nourished, no acute distress. Eyes: Pink conjunctiva, anicteric sclera. HEENT: Normocephalic, moist mucous membranes. Lungs: No audible wheezing or coughing. Heart: Regular rate and rhythm. Abdomen: Soft, nontender, no obvious distention. Musculoskeletal: No edema, cyanosis, or clubbing. Neuro: Confused.  Cranial nerves grossly intact. Skin: No rashes or petechiae noted. Psych: Normal affect.   LAB RESULTS:  Lab Results  Component Value Date   NA 138 11/06/2020   K 4.0 11/06/2020   CL 102 11/06/2020   CO2 28 11/06/2020   GLUCOSE 87 11/06/2020   BUN 29 (H) 11/06/2020   CREATININE 0.86 11/06/2020  CALCIUM 8.8 (L) 11/06/2020   PROT 6.1 (L) 10/30/2020   ALBUMIN 3.7 10/30/2020   AST 30 10/30/2020   ALT 14 10/30/2020   ALKPHOS 90 10/30/2020   BILITOT 0.5 10/30/2020   GFRNONAA >60 11/06/2020   GFRAA 52 (L) 06/19/2020    Lab Results  Component Value Date   WBC 11.7 (H) 11/06/2020   NEUTROABS 10.2 (H) 10/31/2020   HGB 8.8 (L) 11/06/2020   HCT 26.6 (L) 11/06/2020   MCV 86.4 11/06/2020   PLT 203 11/06/2020     STUDIES: DG Chest 1 View  Result Date: 10/30/2020 CLINICAL DATA:  Fall. EXAM: CHEST  1 VIEW COMPARISON:  Chest x-ray dated August 18, 2020. FINDINGS: Unchanged mild cardiomegaly. Normal pulmonary vascularity. No focal consolidation, pleural effusion, or  pneumothorax. No acute osseous abnormality. Prior left shoulder reverse total arthroplasty. IMPRESSION: 1. No active disease. Electronically Signed   By: Titus Dubin M.D.   On: 10/30/2020 09:28   CT HEAD WO CONTRAST  Result Date: 11/04/2020 CLINICAL DATA:  Headache. Intracranial hemorrhage suspected. History of hip surgery. EXAM: CT HEAD WITHOUT CONTRAST TECHNIQUE: Contiguous axial images were obtained from the base of the skull through the vertex without intravenous contrast. COMPARISON:  03/18/2020 FINDINGS: Brain: Mild cerebral atrophy is unchanged. Again noted is diffuse low-density in the periventricular and subcortical white matter. White matter changes are stable. Negative for acute hemorrhage, mass lesion, midline shift, hydrocephalus or large infarct. Vascular: No hyperdense vessel or unexpected calcification. Skull: Normal. Negative for fracture or focal lesion. Sinuses/Orbits: Small amount of fluid in the posterior right mastoid air cells. Minimal mucosal disease in the right anterior ethmoid air cells. Other: None IMPRESSION: 1. No acute intracranial abnormality. 2. Stable white matter changes that likely represent chronic small vessel ischemic disease. 3. Minimal sinus disease. Electronically Signed   By: Markus Daft M.D.   On: 11/04/2020 09:44   CT PELVIS WO CONTRAST  Result Date: 10/30/2020 CLINICAL DATA:  Slipped on ice. EXAM: CT PELVIS WITHOUT CONTRAST TECHNIQUE: Multidetector CT imaging of the pelvis was performed following the standard protocol without intravenous contrast. COMPARISON:  None. FINDINGS: Urinary Tract:  Unremarkable Bowel: Multiple diverticula sigmoid colon without acute inflammation. Vascular/Lymphatic: No pelvic adenopathy. Reproductive:  Post hysterectomy.  Adnexa unremarkable Other: Musculoskeletal: Subcapital femoral neck fracture with mild impaction. Minimal angulation. The joint is maintained. No hematoma. There is bruising in the subcutaneous tissue over the LEFT  hip. No muscular hematoma. Posterior lumbar fusion IMPRESSION: LEFT femoral neck subcapital impaction fracture. Electronically Signed   By: Suzy Bouchard M.D.   On: 10/30/2020 10:06   DG HIP OPERATIVE UNILAT W OR W/O PELVIS LEFT  Result Date: 10/31/2020 CLINICAL DATA:  Hip fixation EXAM: OPERATIVE left HIP (WITH PELVIS IF PERFORMED)  VIEWS TECHNIQUE: Fluoroscopic spot image(s) were submitted for interpretation post-operatively. COMPARISON:  None. FINDINGS: Five intraop views were submitted for review. The patient is status post ORIF cannulated pin fixation of the left femoral neck fracture. Fluoro time 1 minutes 31 seconds IMPRESSION: Status post ORIF pin fixation of the femoral neck fracture Electronically Signed   By: Prudencio Pair M.D.   On: 10/31/2020 19:28   DG Hip Unilat With Pelvis 2-3 Views Left  Result Date: 10/30/2020 CLINICAL DATA:  Fall on ice with left hip pain EXAM: DG HIP (WITH OR WITHOUT PELVIS) 2-3V LEFT COMPARISON:  None. FINDINGS: There is cortical irregularity and slight angulation in the subcapital left femoral neck suspicious for nondisplaced subcapital left femoral neck fracture. No additional potential fracture.  No left hip dislocation. No focal osseous lesions. Partially visualized bilateral posterior spinal fusion hardware in the lower lumbar spine. No significant degenerative changes in the hip joints. IMPRESSION: Cortical irregularity and slight angulation in the subcapital left femoral neck suspicious for nondisplaced subcapital left femoral neck fracture. No left hip dislocation. Consider left hip CT for further evaluation. Electronically Signed   By: Ilona Sorrel M.D.   On: 10/30/2020 09:28    ASSESSMENT: Chronic ITP, left femoral neck fracture.  PLAN:    1.  Chronic ITP: Patient's platelet count has now improved to greater than 200 with Nplate and IVIG.  No further interventions are needed.  Keep follow-up as scheduled in the cancer center in approximately 3 weeks.   Appreciate palliative care input. 2.  Anemia: Chronic and unchanged.  Patient's hemoglobin is 8.8.  She does not require transfusion at this time.  Monitor. 3.  Left femoral neck fracture: Appreciate orthopedic input.  Patient will likely need discharge to rehab. 4.  Confusion: Patient has underlying dementia.  CT scan of the head on November 04, 2020 is negative.  Appreciate consult, call with questions.    Lloyd Huger, MD   11/06/2020 12:12 PM

## 2020-11-06 NOTE — [place] FL2 (Signed)
[place] MEDICAID FL2 LEVEL OF CARE SCREENING TOOL     IDENTIFICATION  Patient Name: Ariana [name] Birthdate: 1933-07-16 Sex: female Admission Date (Current Location): 10/30/2020  [place] Number:  Chiropodist and Address:  [facility], [address], [place], [place] [address]      Provider Number: [id number]  Attending Physician Name and Address:  Charise Killian, MD  Relative Name and Phone Number:  Delton See [phone]    Current Level of Care: Hospital Recommended Level of Care: Skilled Nursing Facility Prior Approval Number:    Date Approved/Denied:   PASRR Number: [id number]  Discharge Plan:      Current Diagnoses: Patient Active Problem List   Diagnosis Date Noted  . Palliative care encounter   . Closed displaced fracture of left femoral neck (HCC) 10/30/2020  . Hiatal hernia   . GERD (gastroesophageal reflux disease)   . Rectal bleeding 08/18/2020  . Leukocytosis 08/18/2020  . Failure to thrive in adult 02/16/2020  . Thrombocytopenia (HCC) 02/15/2020  . Pneumonia 07/10/2019  . Laceration of left lower leg 04/10/2019  . Chronic pain disorder 01/11/2019  . OSA (obstructive sleep apnea) 09/28/2018  . Ulcer of lower limb, right, limited to breakdown of skin (HCC) 07/22/2018  . Traumatic hematoma of right lower leg 07/12/2018  . Asthma without status asthmaticus 11/25/2016  . Late onset Alzheimer's disease without behavioral disturbance (HCC) 12/30/2015  . Back muscle spasm 09/09/2015  . Clinical depression 04/12/2015  . Herpes zona 04/12/2015  . Acute ITP (HCC) 03/15/2015  . Chest pain 10/22/2014  . Breathlessness on exertion 10/22/2014  . Osteopenia 10/18/2014  . Benign essential hypertension 06/28/2014  . HLD (hyperlipidemia) 06/28/2014  . Idiopathic thrombocythemia (HCC) 06/28/2014  . Acquired hypothyroidism 06/04/2014  . Depressive disorder 06/04/2014  . Bursitis,  trochanteric 03/27/2014  . DDD (degenerative disc disease), lumbar 02/27/2014  . Neuritis or radiculitis due to rupture of lumbar intervertebral disc 02/27/2014  . Lumbar radiculitis 02/27/2014    Orientation RESPIRATION BLADDER Height & Weight     Time,Self,Situation,Place  Normal External catheter Weight: 67.1 kg Height:  5\' 2"  (157.5 cm)  BEHAVIORAL SYMPTOMS/MOOD NEUROLOGICAL BOWEL NUTRITION STATUS      Continent Diet (Regular)  AMBULATORY STATUS COMMUNICATION OF NEEDS Skin   Extensive Assist Verbally Surgical wounds,Bruising                       Personal Care Assistance Level of Assistance  Dressing,Feeding,Bathing Bathing Assistance: Limited assistance Feeding assistance: Limited assistance Dressing Assistance: Limited assistance     Functional Limitations Info             SPECIAL CARE FACTORS FREQUENCY  PT (By licensed PT),OT (By licensed OT)                    Contractures Contractures Info: Not present    Additional Factors Info  Allergies,Code Status Code Status Info: DNR Allergies Info: Aspirin, Diazepam, Tetanus, Morphine           Current Medications (11/06/2020):  This is the current hospital active medication list Current Facility-Administered Medications  Medication Dose Route Frequency Provider Last Rate Last Admin  . 0.9 %  sodium chloride infusion   Intravenous Continuous 11/08/2020, MD   Stopped at 11/04/20 0315  . acetaminophen (TYLENOL) tablet 1,000 mg  1,000 mg Oral Q8H 11/06/20, MD   1,000 mg at 11/06/20 0547  . albuterol (VENTOLIN HFA) 108 (90 Base)  MCG/ACT inhaler 2 puff  2 puff Inhalation Q6H PRN Leim Fabry, MD      . ALPRAZolam Duanne Moron) tablet 0.5 mg  0.5 mg Oral TID PRN Leim Fabry, MD   0.5 mg at 11/04/20 1758  . amLODipine (NORVASC) tablet 5 mg  5 mg Oral Daily Val Riles, MD   5 mg at 11/05/20 0945  . ascorbic acid (VITAMIN C) tablet 500 mg  500 mg Oral Daily Leim Fabry, MD   500 mg at 11/05/20 0940  .  benzonatate (TESSALON) capsule 200 mg  200 mg Oral TID Leim Fabry, MD   200 mg at 11/05/20 2222  . bisacodyl (DULCOLAX) suppository 10 mg  10 mg Rectal Daily PRN Leim Fabry, MD      . buPROPion (WELLBUTRIN XL) 24 hr tablet 150 mg  150 mg Oral Daily Leim Fabry, MD   150 mg at 11/05/20 0945  . busPIRone (BUSPAR) tablet 15 mg  15 mg Oral BID Leim Fabry, MD   15 mg at 11/05/20 2223  . calcium-vitamin D (OSCAL WITH D) 500-200 MG-UNIT per tablet 1 tablet  1 tablet Oral Daily Leim Fabry, MD   1 tablet at 11/05/20 0945  . carboxymethylcellul-glycerin (REFRESH OPTIVE) 0.5-0.9 % ophthalmic solution 1 drop  1 drop Both Eyes QID PRN Leim Fabry, MD      . Chlorhexidine Gluconate Cloth 2 % PADS 6 each  6 each Topical Q0600 Val Riles, MD   6 each at 11/06/20 0547  . cholecalciferol (VITAMIN D3) tablet 2,000 Units  2,000 Units Oral Daily Leim Fabry, MD   2,000 Units at 11/05/20 [phone]  . citalopram (CELEXA) tablet 20 mg  20 mg Oral Chong Sicilian, Tarry Kos, MD   20 mg at 11/05/20 [phone]  . docusate sodium (COLACE) capsule 100 mg  100 mg Oral BID Leim Fabry, MD   100 mg at 11/05/20 2223  . donepezil (ARICEPT) tablet 10 mg  10 mg Oral BID Leim Fabry, MD   10 mg at 11/05/20 2223  . enoxaparin (LOVENOX) injection 40 mg  40 mg Subcutaneous QPM Val Riles, MD   40 mg at 11/05/20 1710  . feeding supplement (ENSURE ENLIVE / ENSURE PLUS) liquid 237 mL  237 mL Oral BID BM Leim Fabry, MD   237 mL at 11/05/20 1310  . fluticasone (FLONASE) 50 MCG/ACT nasal spray 2 spray  2 spray Each Nare Daily PRN Leim Fabry, MD      . gabapentin (NEURONTIN) capsule 300 mg  300 mg Oral QID Leim Fabry, MD   300 mg at 11/05/20 2223  . guaiFENesin-dextromethorphan (ROBITUSSIN DM) 100-10 MG/5ML syrup 5 mL  5 mL Oral Q4H PRN Lang Snow, NP   5 mL at 11/05/20 0445  . HYDROmorphone (DILAUDID) injection 0.25-0.5 mg  0.25-0.5 mg Intravenous Q2H PRN Leim Fabry, MD      . lamoTRIgine (LAMICTAL) tablet 50 mg  50 mg  Oral QHS Leim Fabry, MD   50 mg at 11/05/20 2223  . levothyroxine (SYNTHROID) tablet 50 mcg  50 mcg Oral QAC breakfast Leim Fabry, MD   50 mcg at 11/06/20 0546  . magnesium oxide (MAG-OX) tablet 400 mg  400 mg Oral BID Val Riles, MD   400 mg at 11/05/20 2224  . melatonin tablet 6 mg  6 mg Oral QPM Val Riles, MD   6 mg at 11/05/20 1709  . memantine (NAMENDA) tablet 10 mg  10 mg Oral BID Leim Fabry, MD   10 mg at 11/05/20 2223  .  methocarbamol (ROBAXIN) tablet 500 mg  500 mg Oral Q6H PRN Leim Fabry, MD   500 mg at 11/05/20 0945   Or  . methocarbamol (ROBAXIN) 500 mg in dextrose 5 % 50 mL IVPB  500 mg Intravenous Q6H PRN Leim Fabry, MD      . metoCLOPramide (REGLAN) tablet 5-10 mg  5-10 mg Oral Q8H PRN Leim Fabry, MD       Or  . metoCLOPramide (REGLAN) injection 5-10 mg  5-10 mg Intravenous Q8H PRN Leim Fabry, MD      . montelukast (SINGULAIR) tablet 10 mg  10 mg Oral QHS Leim Fabry, MD   10 mg at 11/04/20 2215  . multivitamin-lutein (OCUVITE-LUTEIN) capsule 1 capsule  1 capsule Oral BID Leim Fabry, MD   1 capsule at 11/05/20 2224  . mupirocin ointment (BACTROBAN) 2 %   Nasal BID Val Riles, MD   Given at 11/05/20 2225  . ondansetron (ZOFRAN) tablet 4 mg  4 mg Oral Q6H PRN Leim Fabry, MD       Or  . ondansetron [facility]) injection 4 mg  4 mg Intravenous Q6H PRN Leim Fabry, MD   4 mg at 11/03/20 2252  . oxybutynin (DITROPAN) tablet 5 mg  5 mg Oral TID Leim Fabry, MD   5 mg at 11/05/20 2227  . oxyCODONE (Oxy IR/ROXICODONE) immediate release tablet 2.5-5 mg  2.5-5 mg Oral Q3H PRN Leim Fabry, MD   5 mg at 11/05/20 2334  . oxyCODONE (Oxy IR/ROXICODONE) immediate release tablet 5-10 mg  5-10 mg Oral Q4H PRN Leim Fabry, MD   10 mg at 11/04/20 1430  . pantoprazole (PROTONIX) EC tablet 20 mg  20 mg Oral BID Leim Fabry, MD   20 mg at 11/05/20 2226  . pravastatin (PRAVACHOL) tablet 20 mg  20 mg Oral QHS Leim Fabry, MD   20 mg at 11/05/20 2223  . senna (SENOKOT)  tablet 8.6 mg  1 tablet Oral BID Leim Fabry, MD   8.6 mg at 11/05/20 2226  . senna-docusate (Senokot-S) tablet 1 tablet  1 tablet Oral QHS PRN Leim Fabry, MD      . sodium phosphate (FLEET) 7-19 GM/118ML enema 1 enema  1 enema Rectal Once PRN Leim Fabry, MD      . traMADol Veatrice Bourbon) tablet 50 mg  50 mg Oral Q6H PRN Leim Fabry, MD   50 mg at 11/05/20 7425  . vitamin B-12 (CYANOCOBALAMIN) tablet 1,000 mcg  1,000 mcg Oral Daily Leim Fabry, MD   1,000 mcg at 11/05/20 0945  . vitamin E capsule 400 Units  400 Units Oral Daily Leim Fabry, MD   400 Units at 11/05/20 9563     Discharge Medications: Please see discharge summary for a list of discharge medications.  Relevant Imaging Results:  Relevant Lab Results:   Additional Information SS# [id number]  Shelbie Ammons, RN

## 2020-11-07 DIAGNOSIS — S72002A Fracture of unspecified part of neck of left femur, initial encounter for closed fracture: Secondary | ICD-10-CM | POA: Diagnosis not present

## 2020-11-07 DIAGNOSIS — D693 Immune thrombocytopenic purpura: Secondary | ICD-10-CM | POA: Diagnosis not present

## 2020-11-07 DIAGNOSIS — I10 Essential (primary) hypertension: Secondary | ICD-10-CM | POA: Diagnosis not present

## 2020-11-07 LAB — BASIC METABOLIC PANEL
Anion gap: 8 (ref 5–15)
BUN: 29 mg/dL — ABNORMAL HIGH (ref 8–23)
CO2: 27 mmol/L (ref 22–32)
Calcium: 9.1 mg/dL (ref 8.9–10.3)
Chloride: 101 mmol/L (ref 98–111)
Creatinine, Ser: 0.9 mg/dL (ref 0.44–1.00)
GFR, Estimated: >60 mL/min (ref >60)
Glucose, Bld: 88 mg/dL (ref 70–99)
Potassium: 4.5 mmol/L (ref 3.5–5.1)
Sodium: 136 mmol/L (ref 135–145)

## 2020-11-07 LAB — CBC
HCT: 28.2 % — ABNORMAL LOW (ref 36.0–46.0)
Hemoglobin: 9.1 g/dL — ABNORMAL LOW (ref 12.0–15.0)
MCH: 28.3 pg (ref 26.0–34.0)
MCHC: 32.3 g/dL (ref 30.0–36.0)
MCV: 87.9 fL (ref 80.0–100.0)
Platelets: 297 10*3/uL (ref 150–400)
RBC: 3.21 MIL/uL — ABNORMAL LOW (ref 3.87–5.11)
RDW: 19.9 % — ABNORMAL HIGH (ref 11.5–15.5)
WBC: 15.3 10*3/uL — ABNORMAL HIGH (ref 4.0–10.5)
nRBC: 0.3 % — ABNORMAL HIGH (ref 0.0–0.2)

## 2020-11-07 MED ORDER — TRAZODONE HCL 50 MG PO TABS
25.0000 mg | ORAL_TABLET | Freq: Every evening | ORAL | Status: DC | PRN
  Administered 2020-11-08: 25 mg via ORAL
  Filled 2020-11-07: qty 1

## 2020-11-07 MED ORDER — MELATONIN 5 MG PO TABS
5.0000 mg | ORAL_TABLET | Freq: Every evening | ORAL | Status: DC
  Administered 2020-11-08 – 2020-11-11 (×3): 5 mg via ORAL
  Filled 2020-11-07 (×3): qty 1

## 2020-11-07 NOTE — Progress Notes (Signed)
Physical Therapy Treatment Patient Details Name: Ariana [name] MRN: [id number] DOB: 1932/11/23 Today's Date: 11/07/2020    History of Present Illness Ariana [name] is a 87 y.o. female with medical history significant for ITP, Alzheimer's dementia, depression, hypothyroidism, anxiety disorder and asthma who arrives to the ER via EMS for evaluation of left hip pain following a fall.  Patient was walking her dog and slipped on ice, landing on her left side. S/p L ORIF on 10/31/20.    PT Comments    Pt was long sitting in bed upon arriving. She agrees to session and is cooperative throughout. Was able to exit bed with assistance, stand to RW and ambulate 100 ft with poor gait posture and narrow BOS. Pt performed there ex handout once returned to room. Overall pt continues to progress well. Would greatly benefit from SNF at [place] to continue skilled PT while assisting pt to PLOF. Pt was sitting in recliner with chair alarm in place and call bell in reach.     Follow Up Recommendations  SNF     Equipment Recommendations  None recommended by PT    Recommendations for Other Services       Precautions / Restrictions Precautions Precautions: Fall Restrictions Weight Bearing Restrictions: Yes LLE Weight Bearing: Weight bearing as tolerated    Mobility  Bed Mobility Overal bed mobility: Needs Assistance Bed Mobility: Supine to Sit;Rolling;Sit to Supine Rolling: Min assist   Supine to sit: Min assist Sit to supine: Min assist      Transfers Overall transfer level: Needs assistance Equipment used: Rolling walker (2 wheeled) Transfers: Sit to/from Stand Sit to Stand: Min guard;Min assist Stand pivot transfers: Min assist          Ambulation/Gait Ambulation/Gait assistance: Min assist Gait Distance (Feet): 100 Feet Assistive device: Rolling walker (2 wheeled) Gait Pattern/deviations: Step-to pattern;Drifts right/left;Staggering left;Shuffle;Narrow base of  support Gait velocity: decreased   General Gait Details: Pt was able to advance ambulation distances today with improved gait kinematic. still at high fall risk with poor gait posture + narrow BOS.       Balance Overall balance assessment: Needs assistance Sitting-balance support: No upper extremity supported;Feet supported Sitting balance-Leahy Scale: Fair     Standing balance support: Bilateral upper extremity supported;During functional activity Standing balance-Leahy Scale: Fair      Cognition Arousal/Alertness: Awake/alert Behavior During Therapy: WFL for tasks assessed/performed Overall Cognitive Status: History of cognitive impairments - at baseline        General Comments: Pt is alert and able to follow commands throughout. Does have cognition deficits at baseline             Pertinent Vitals/Pain Pain Assessment: 0-10 Pain Score: 4  Faces Pain Scale: Hurts little more Pain Location: L hip Pain Descriptors / Indicators: Sore;Grimacing;Guarding Pain Intervention(s): Limited activity within patient's tolerance;Monitored during session;Repositioned           PT Goals (current goals can now be found in the care plan section) Acute Rehab PT Goals Patient Stated Goal: To return home safely to see my dogs Progress towards PT goals: Progressing toward goals    Frequency    7X/week      PT Plan Current plan remains appropriate       AM-PAC PT "6 Clicks" Mobility   Outcome Measure  Help needed turning from your back to your side while in a flat bed without using bedrails?: A Little Help needed moving from lying on your back to sitting on  the side of a flat bed without using bedrails?: A Little Help needed moving to and from a bed to a chair (including a wheelchair)?: A Little Help needed standing up from a chair using your arms (e.g., wheelchair or bedside chair)?: A Little Help needed to walk in hospital room?: A Little Help needed climbing 3-5 steps  with a railing? : A Lot 6 Click Score: 17    End of Session Equipment Utilized During Treatment: Gait belt Activity Tolerance: Patient tolerated treatment well Patient left: in chair;with call bell/phone within reach;with chair alarm set Nurse Communication: Mobility status PT Visit Diagnosis: Muscle weakness (generalized) (M62.81);Difficulty in walking, not elsewhere classified (R26.2);Pain Pain - Right/Left: Left Pain - part of body: Hip     Time: 0936-1000 PT Time Calculation (min) (ACUTE ONLY): 24 min  Charges:  $Gait Training: 8-22 mins $Therapeutic Exercise: 8-22 mins                     Julaine Fusi PTA 11/07/20, 12:34 PM

## 2020-11-07 NOTE — Progress Notes (Signed)
Patient very upset. She is crying. Says she wants to go home. That she is homesick. Patient given PRN Xanax to help with anxiety. Patient currently on phone with husband.

## 2020-11-07 NOTE — Progress Notes (Signed)
PROGRESS NOTE    Ariana [name]  ZOX:[id number] DOB: 17-May-1933 DOA: 10/30/2020 PCP: Idelle Crouch, MD   Assessment & Plan:   Principal Problem:   Closed displaced fracture of left femoral neck ([place]) Active Problems:   Acute ITP ([place])   Acquired hypothyroidism   Depressive disorder   Benign essential hypertension   Late onset Alzheimer's disease without behavioral disturbance (HCC)   Idiopathic thrombocythemia ([place])   Hiatal hernia   GERD (gastroesophageal reflux disease)   Palliative care encounter  Closed displaced fracture of left femoral neck: secondary to mechanical fall. S/p ORIF on 10/31/20 as per ortho surg. PT/OT recs SNF   Idiopathic thrombocytopenic purpura: chronic. S/p Nplate x 1, 2 units of platelets, IVIG x 2 doses as per heme.   HTN: continue on CCB   Hypothyroidism: continue on synthroid   Alzheimer's dementia: continue on namenda, aricept   Anxiety: severity unknown. Continue on home dose of alprazolam.   Depression: severity unknown. Continue on home dose of wellbutrin, buspar   Hernia with GERD: continue on pantoprazole    DVT prophylaxis: lovenox Code Status: DNR Family Communication:  Disposition Plan: SNF   Status is: Inpatient  Remains inpatient appropriate because:Unsafe d/c plan, waiting SNF placement    Dispo: The patient is from: Home              Anticipated d/c is to: SNF              Anticipated d/c date is: 2 days              Patient currently is medically stable to d/c.   Difficult to place patient Yes  Level of care: medsurg  Consultants:   Palliative care  Ortho surg   Procedures:  Antimicrobials:  Subjective: Pt c/o malaise  Objective: Vitals:   11/06/20 1532 11/06/20 2011 11/07/20 0132 11/07/20 0601  BP: (!) 155/60 (!) 131/49 (!) 142/64 122/69  Pulse: 67 62 68 62  Resp: 16 16 16 16   Temp: 98.7 F (37.1 C) 98.4 F (36.9 C) 98.3 F (36.8 C) 98.5 F (36.9 C)  TempSrc: Oral  Oral    SpO2: 100% 97% 98% 98%  Weight:      Height:        Intake/Output Summary (Last 24 hours) at 11/07/2020 0721 Last data filed at 11/06/2020 1412 Gross per 24 hour  Intake 480 ml  Output -  Net 480 ml   Filed Weights   10/30/20 0846 10/31/20 1136  Weight: 67.1 kg 67.1 kg    Examination:  General exam: Appears calm & comfortable Respiratory system: clear breath sounds b/l. No rales  Cardiovascular system: S1/S2+. No rubs or gallops Gastrointestinal system: Abd is soft, NT, ND & normal bowel sounds  Central nervous system: Alert & awake. Moves all 4 extremities  Psychiatry: Judgement and insight appear abnormal. Flat mood and affect    Data Reviewed: I have personally reviewed following labs and imaging studies  CBC: Recent Labs  Lab 10/31/20 1057 11/01/20 0346 11/02/20 2251 11/03/20 0544 11/04/20 0325 11/05/20 0612 11/06/20 0528  WBC 13.2*   < > 15.1* 10.7* 11.4* 11.1* 11.7*  NEUTROABS 10.2*  --   --   --   --   --   --   HGB 11.3*   < > 7.7* 8.2* 8.9* 8.7* 8.8*  HCT 35.1*   < > 24.0* 25.6* 26.5* 26.7* 26.6*  MCV 86.9   < > 87.3 87.7 84.7 86.1 86.4  PLT  18*   < > 27* 33* 105* 130* 203   < > = values in this interval not displayed.   Basic Metabolic Panel: Recent Labs  Lab 11/02/20 0554 11/03/20 0544 11/04/20 0325 11/05/20 0612 11/06/20 0528  NA 140 141 134* 134* 138  K 4.2 4.0 3.8 3.9 4.0  CL 105 104 98 96* 102  CO2 29 30 28 29 28   GLUCOSE 104* 77 85 104* 87  BUN 28* 24* 20 32* 29*  CREATININE 0.97 0.80 0.75 0.98 0.86  CALCIUM 8.8* 8.6* 9.0 8.9 8.8*  MG  --  1.8 1.7 2.0  --   PHOS  --  3.4 4.0 5.0*  --    GFR: Estimated Creatinine Clearance: 43 mL/min (by C-G formula based on SCr of 0.86 mg/dL). Liver Function Tests: No results for input(s): AST, ALT, ALKPHOS, BILITOT, PROT, ALBUMIN in the last 168 hours. No results for input(s): LIPASE, AMYLASE in the last 168 hours. No results for input(s): AMMONIA in the last 168 hours. Coagulation Profile: No  results for input(s): INR, PROTIME in the last 168 hours. Cardiac Enzymes: No results for input(s): CKTOTAL, CKMB, CKMBINDEX, TROPONINI in the last 168 hours. BNP (last 3 results) No results for input(s): PROBNP in the last 8760 hours. HbA1C: No results for input(s): HGBA1C in the last 72 hours. CBG: Recent Labs  Lab 11/04/20 0902  GLUCAP 74   Lipid Profile: No results for input(s): CHOL, HDL, LDLCALC, TRIG, CHOLHDL, LDLDIRECT in the last 72 hours. Thyroid Function Tests: No results for input(s): TSH, T4TOTAL, FREET4, T3FREE, THYROIDAB in the last 72 hours. Anemia Panel: No results for input(s): VITAMINB12, FOLATE, FERRITIN, TIBC, IRON, RETICCTPCT in the last 72 hours. Sepsis Labs: No results for input(s): PROCALCITON, LATICACIDVEN in the last 168 hours.  Recent Results (from the past 240 hour(s))  Resp Panel by RT-PCR (Flu A&B, Covid) Nasopharyngeal Swab     Status: None   Collection Time: 10/30/20  9:35 AM   Specimen: Nasopharyngeal Swab; Nasopharyngeal(NP) swabs in vial transport medium  Result Value Ref Range Status   SARS Coronavirus 2 by RT PCR NEGATIVE NEGATIVE Final    Comment: (NOTE) SARS-CoV-2 target nucleic acids are NOT DETECTED.  The SARS-CoV-2 RNA is generally detectable in upper respiratory specimens during the acute phase of infection. The lowest concentration of SARS-CoV-2 viral copies this assay can detect is 138 copies/mL. A negative result does not preclude SARS-Cov-2 infection and should not be used as the sole basis for treatment or other patient management decisions. A negative result may occur with  improper specimen collection/handling, submission of specimen other than nasopharyngeal swab, presence of viral mutation(s) within the areas targeted by this assay, and inadequate number of viral copies(<138 copies/mL). A negative result must be combined with clinical observations, patient history, and epidemiological information. The expected result is  Negative.  Fact Sheet for Patients:  EntrepreneurPulse.com.au  Fact Sheet for Healthcare Providers:  IncredibleEmployment.be  This test is no t yet approved or cleared by the [place] FDA and  has been authorized for detection and/or diagnosis of SARS-CoV-2 by FDA under an Emergency Use Authorization (EUA). This EUA will remain  in effect (meaning this test can be used) for the duration of the COVID-19 declaration under Section 564(b)(1) of the Act, 21 U.S.C.section 360bbb-3(b)(1), unless the authorization is terminated  or revoked sooner.       Influenza A by PCR NEGATIVE NEGATIVE Final   Influenza B by PCR NEGATIVE NEGATIVE Final    Comment: (NOTE)  The Xpert Xpress SARS-CoV-2/FLU/RSV plus assay is intended as an aid in the diagnosis of influenza from Nasopharyngeal swab specimens and should not be used as a sole basis for treatment. Nasal washings and aspirates are unacceptable for Xpert Xpress SARS-CoV-2/FLU/RSV testing.  Fact Sheet for Patients: EntrepreneurPulse.com.au  Fact Sheet for Healthcare Providers: IncredibleEmployment.be  This test is not yet approved or cleared by the [place] FDA and has been authorized for detection and/or diagnosis of SARS-CoV-2 by FDA under an Emergency Use Authorization (EUA). This EUA will remain in effect (meaning this test can be used) for the duration of the COVID-19 declaration under Section 564(b)(1) of the Act, 21 U.S.C. section 360bbb-3(b)(1), unless the authorization is terminated or revoked.  Performed at [facility], [address] [place], [place] [address]   Surgical pcr screen     Status: Abnormal   Collection Time: 10/31/20 10:14 AM   Specimen: Nasal Mucosa; Nasal Swab  Result Value Ref Range Status   MRSA, PCR POSITIVE (A) NEGATIVE Final    Comment: RESULT CALLED TO, READ BACK BY AND VERIFIED WITH: DANA LINONS 10/31/20 1210  KLW    Staphylococcus aureus POSITIVE (A) NEGATIVE Final    Comment: (NOTE) The Xpert SA Assay (FDA approved for NASAL specimens in patients 61 years of age and older), is one component of a comprehensive surveillance program. It is not intended to diagnose infection nor to guide or monitor treatment. Performed at [facility], [address] [place], [place] [address]          Radiology Studies: No results found.      Scheduled Meds: . acetaminophen  1,000 mg Oral Q8H  . amLODipine  5 mg Oral Daily  . vitamin C  500 mg Oral Daily  . benzonatate  200 mg Oral TID  . buPROPion  150 mg Oral Daily  . busPIRone  15 mg Oral BID  . calcium-vitamin D  1 tablet Oral Daily  . Chlorhexidine Gluconate Cloth  6 each Topical Q0600  . cholecalciferol  2,000 Units Oral Daily  . citalopram  20 mg Oral [id number]  . docusate sodium  100 mg Oral BID  . donepezil  10 mg Oral BID  . enoxaparin (LOVENOX) injection  40 mg Subcutaneous QPM  . feeding supplement  237 mL Oral BID BM  . gabapentin  300 mg Oral QID  . lamoTRIgine  50 mg Oral QHS  . levothyroxine  50 mcg Oral QAC breakfast  . melatonin  6 mg Oral QPM  . memantine  10 mg Oral BID  . montelukast  10 mg Oral QHS  . multivitamin-lutein  1 capsule Oral BID  . mupirocin ointment   Nasal BID  . oxybutynin  5 mg Oral TID  . pantoprazole  20 mg Oral BID  . pravastatin  20 mg Oral QHS  . senna  1 tablet Oral BID  . vitamin B-12  1,000 mcg Oral Daily  . vitamin E  400 Units Oral Daily   Continuous Infusions: . sodium chloride Stopped (11/04/20 0315)  . methocarbamol (ROBAXIN) IV       LOS: 8 days    Time spent: 30 mins     Wyvonnia Dusky, MD [facility] Pager [phone]  If 7PM-7AM, please contact night-coverage 11/07/2020, 7:21 AM

## 2020-11-07 NOTE — Care Management Important Message (Signed)
Important Message  Patient Details  Name: Ariana [name] MRN: [id number] Date of Birth: [birth year]   Medicare Important Message Given:  Yes     [name] 11/07/2020, 10:45 AM

## 2020-11-08 DIAGNOSIS — D693 Immune thrombocytopenic purpura: Secondary | ICD-10-CM | POA: Diagnosis not present

## 2020-11-08 DIAGNOSIS — I10 Essential (primary) hypertension: Secondary | ICD-10-CM | POA: Diagnosis not present

## 2020-11-08 DIAGNOSIS — S72002A Fracture of unspecified part of neck of left femur, initial encounter for closed fracture: Secondary | ICD-10-CM | POA: Diagnosis not present

## 2020-11-08 LAB — BASIC METABOLIC PANEL
Anion gap: 7 (ref 5–15)
BUN: 21 mg/dL (ref 8–23)
CO2: 29 mmol/L (ref 22–32)
Calcium: 9.2 mg/dL (ref 8.9–10.3)
Chloride: 106 mmol/L (ref 98–111)
Creatinine, Ser: 0.85 mg/dL (ref 0.44–1.00)
GFR, Estimated: >60 mL/min (ref >60)
Glucose, Bld: 85 mg/dL (ref 70–99)
Potassium: 3.9 mmol/L (ref 3.5–5.1)
Sodium: 142 mmol/L (ref 135–145)

## 2020-11-08 LAB — CBC
HCT: 27.1 % — ABNORMAL LOW (ref 36.0–46.0)
Hemoglobin: 9.1 g/dL — ABNORMAL LOW (ref 12.0–15.0)
MCH: 28.2 pg (ref 26.0–34.0)
MCHC: 33.6 g/dL (ref 30.0–36.0)
MCV: 83.9 fL (ref 80.0–100.0)
Platelets: 293 10*3/uL (ref 150–400)
RBC: 3.23 MIL/uL — ABNORMAL LOW (ref 3.87–5.11)
RDW: 20 % — ABNORMAL HIGH (ref 11.5–15.5)
WBC: 15.3 10*3/uL — ABNORMAL HIGH (ref 4.0–10.5)
nRBC: 0.3 % — ABNORMAL HIGH (ref 0.0–0.2)

## 2020-11-08 NOTE — Progress Notes (Addendum)
Physical Therapy Treatment Patient Details Name: Ariana [name] MRN: [id number] DOB: February 01, 1933 Today's Date: 11/08/2020    History of Present Illness Ariana [name] is a 87 y.o. female with medical history significant for ITP, Alzheimer's dementia, depression, hypothyroidism, anxiety disorder and asthma who arrives to the ER via EMS for evaluation of left hip pain following a fall.  Patient was walking her dog and slipped on ice, landing on her left side. S/p L ORIF on 10/31/20.    PT Comments    Pt was long sitting in bed with untouched breakfast tray in front of her. She begins crying and is very emotional throughout session. Much more down/sad today than last few days observed. She agrees to session with max encouragement. Upon sitting up EOB (min assist required) pt c/o dizziness and requested to return to supine. BP 152/45. PT requested not to attempt any more OOB but did agree to performing there ex in bed. See exercises listed below. Pt will greatly benefit from continued skilled PT to address deficits while improving independence with ADLs.   Follow Up Recommendations  SNF     Equipment Recommendations  None recommended by PT    Recommendations for Other Services       Precautions / Restrictions Precautions Precautions: Fall Restrictions Weight Bearing Restrictions: Yes LLE Weight Bearing: Weight bearing as tolerated    Mobility  Bed Mobility Overal bed mobility: Needs Assistance Bed Mobility: Supine to Sit;Rolling;Sit to Supine Rolling: Min assist   Supine to sit: Min assist Sit to supine: Min assist   General bed mobility comments: Pt continues to require min assist to achieve short sit EOB. Pt c/o dizziness upon sitting up and requested to return to supine. BP 152/45. Pt did proceed to performing ther ex in bed      Balance Overall balance assessment: Needs assistance Sitting-balance support: No upper extremity supported;Feet  supported Sitting balance-Leahy Scale: Good Sitting balance - Comments: no LOB sitting EOB         Cognition Arousal/Alertness: Awake/alert Behavior During Therapy: Anxious (sad about not being home) Overall Cognitive Status: History of cognitive impairments - at baseline    General Comments: Pt much more sad/down today versus last few days observe. Was agreeable with encouragement      Exercises General Exercises - Lower Extremity Ankle Circles/Pumps: AROM;10 reps Quad Sets: AROM;10 reps Gluteal Sets: AROM;10 reps Heel Slides: AROM;10 reps Hip ABduction/ADduction: AAROM;10 reps Straight Leg Raises: AAROM;10 reps        Pertinent Vitals/Pain Pain Assessment: 0-10 Pain Score: 4  Faces Pain Scale: Hurts a little bit Pain Location: L hip Pain Intervention(s): Limited activity within patient's tolerance;Monitored during session;Premedicated before session;Repositioned           PT Goals (current goals can now be found in the care plan section) Acute Rehab PT Goals Patient Stated Goal: To return home safely to see my dogs Progress towards PT goals: Progressing toward goals    Frequency    7X/week      PT Plan Current plan remains appropriate       AM-PAC PT "6 Clicks" Mobility   Outcome Measure  Help needed turning from your back to your side while in a flat bed without using bedrails?: A Little Help needed moving from lying on your back to sitting on the side of a flat bed without using bedrails?: A Little Help needed moving to and from a bed to a chair (including a wheelchair)?: A Little Help needed standing  up from a chair using your arms (e.g., wheelchair or bedside chair)?: A Little Help needed to walk in hospital room?: A Little Help needed climbing 3-5 steps with a railing? : A Lot 6 Click Score: 17    End of Session   Activity Tolerance: Patient tolerated treatment well Patient left: in bed;with bed alarm set Nurse Communication: Mobility  status PT Visit Diagnosis: Muscle weakness (generalized) (M62.81);Difficulty in walking, not elsewhere classified (R26.2);Pain Pain - Right/Left: Left Pain - part of body: Hip     Time: 0822-0845 PT Time Calculation (min) (ACUTE ONLY): 23 min  Charges:  $Therapeutic Exercise: 8-22 mins $Therapeutic Activity: 8-22 mins                     Julaine Fusi PTA 11/08/20, 2:50 PM

## 2020-11-08 NOTE — Progress Notes (Signed)
OT Cancellation Note  Patient Details Name: Ariana [name] MRN: [id number] DOB: Aug 25, 1933   Cancelled Treatment:    Reason Eval/Treat Not Completed: Other (comment). Upon attempt, pt with RN and preparing to start eating lunch. Will re-attempt OT tx at later date/time as pt is available and medically appropriate.   Jeni Salles, MPH, MS, OTR/L ascom [phone] 11/08/20, 1:19 PM

## 2020-11-08 NOTE — TOC Progression Note (Signed)
Transition of Care [facility]) - Progression Note    Patient Details  Name: Ariana [name] MRN: [id number] Date of Birth: [birth year]  Transition of Care [facility]) CM/SW [place], RN Phone Number: 11/08/2020, 10:30 AM  Clinical Narrative:   RNCM reached out to [facility] with [facility] who is able to accept patient but will not have bed available till Monday. RNCM reached out to patient's husband and he will accept bed. RNCM accepted bed in hub, patient will need Covid test on Sunday.     Expected Discharge Plan: [place] Barriers to Discharge: Continued Medical Work up  Expected Discharge Plan and Services Expected Discharge Plan: [place] In-house Referral: Clinical Social Work     Living arrangements for the past 2 months: Single Family Home                                       Social Determinants of Health (SDOH) Interventions    Readmission Risk Interventions No flowsheet data found.

## 2020-11-08 NOTE — Progress Notes (Signed)
PROGRESS NOTE    Ariana [name]  [id number] DOB: 1933-06-16 DOA: 10/30/2020 PCP: Idelle Crouch, MD   Assessment & Plan:   Principal Problem:   Closed displaced fracture of left femoral neck ([place]) Active Problems:   Acute ITP ([place])   Acquired hypothyroidism   Depressive disorder   Benign essential hypertension   Late onset Alzheimer's disease without behavioral disturbance (HCC)   Idiopathic thrombocythemia ([place])   Hiatal hernia   GERD (gastroesophageal reflux disease)   Palliative care encounter  Closed displaced fracture of left femoral neck: secondary to mechanical fall. S/p ORIF on 10/31/20 as per ortho surg. PT/OT recs SNF   Idiopathic thrombocytopenic purpura: chronic. S/p Nplate x 1, 2 units of platelets, IVIG x 2 doses as per heme.   Leukocytosis: likely reactive   HTN: continue on amlodipine   Hypothyroidism: continue on levothyroxine   Alzheimer's dementia: continue on namenda, aricept   Anxiety: continue on home dose of xanax. Severity unknown.   Depression: continue on home dose of wellbutrin, buspar. Severity unknown  Hernia with GERD: continue on PPI    DVT prophylaxis: lovenox Code Status: DNR Family Communication:  Disposition Plan: SNF   Status is: Inpatient  Remains inpatient appropriate because:Unsafe d/c plan, waiting SNF placement which will be 11/11/20   Dispo: The patient is from: Home              Anticipated d/c is to: SNF              Anticipated d/c date is: 11/11/20              Patient currently is medically stable to d/c.   Difficult to place patient Yes  Level of care: medsurg  Consultants:   Palliative care  Ortho surg   Procedures:  Antimicrobials:  Subjective: Pt c/o fatigue   Objective: Vitals:   11/07/20 2000 11/07/20 2056 11/08/20 0111 11/08/20 0458  BP: (!) 130/54 116/61 (!) 127/55 (!) 156/71  Pulse: 68 67 (!) 57 62  Resp: 18  18 16   Temp: 98.6 F (37 C) 98.2 F (36.8 C) 97.8 F  (36.6 C) 98.1 F (36.7 C)  TempSrc:  Oral  Oral  SpO2: 98% 96% 97% 97%  Weight:      Height:        Intake/Output Summary (Last 24 hours) at 11/08/2020 0746 Last data filed at 11/07/2020 1959 Gross per 24 hour  Intake 840 ml  Output 1 ml  Net 839 ml   Filed Weights   10/30/20 0846 10/31/20 1136  Weight: 67.1 kg 67.1 kg    Examination:  General exam: Appears comfortable and calm Respiratory system: clear breath sounds b/l. No wheezes, rhonchi  Cardiovascular system: S1 & S2+. No rubs or gallops  Gastrointestinal system: Abd is soft, NT, ND & hypoactive bowel sounds  Central nervous system: Alert and awake. Moves all 4 extremities  Psychiatry: Judgement and insight appear abnormal. Flat mood and affect    Data Reviewed: I have personally reviewed following labs and imaging studies  CBC: Recent Labs  Lab 11/04/20 0325 11/05/20 0612 11/06/20 0528 11/07/20 0856 11/08/20 0450  WBC 11.4* 11.1* 11.7* 15.3* 15.3*  HGB 8.9* 8.7* 8.8* 9.1* 9.1*  HCT 26.5* 26.7* 26.6* 28.2* 27.1*  MCV 84.7 86.1 86.4 87.9 83.9  PLT 105* 130* 203 297 0000000   Basic Metabolic Panel: Recent Labs  Lab 11/03/20 0544 11/04/20 0325 11/05/20 0612 11/06/20 0528 11/07/20 0621 11/08/20 0450  NA 141 134* 134* 138  136 142  K 4.0 3.8 3.9 4.0 4.5 3.9  CL 104 98 96* 102 101 106  CO2 30 28 29 28 27 29   GLUCOSE 77 85 104* 87 88 85  BUN 24* 20 32* 29* 29* 21  CREATININE 0.80 0.75 0.98 0.86 0.90 0.85  CALCIUM 8.6* 9.0 8.9 8.8* 9.1 9.2  MG 1.8 1.7 2.0  --   --   --   PHOS 3.4 4.0 5.0*  --   --   --    GFR: Estimated Creatinine Clearance: 43.5 mL/min (by C-G formula based on SCr of 0.85 mg/dL). Liver Function Tests: No results for input(s): AST, ALT, ALKPHOS, BILITOT, PROT, ALBUMIN in the last 168 hours. No results for input(s): LIPASE, AMYLASE in the last 168 hours. No results for input(s): AMMONIA in the last 168 hours. Coagulation Profile: No results for input(s): INR, PROTIME in the last 168  hours. Cardiac Enzymes: No results for input(s): CKTOTAL, CKMB, CKMBINDEX, TROPONINI in the last 168 hours. BNP (last 3 results) No results for input(s): PROBNP in the last 8760 hours. HbA1C: No results for input(s): HGBA1C in the last 72 hours. CBG: Recent Labs  Lab 11/04/20 0902  GLUCAP 74   Lipid Profile: No results for input(s): CHOL, HDL, LDLCALC, TRIG, CHOLHDL, LDLDIRECT in the last 72 hours. Thyroid Function Tests: No results for input(s): TSH, T4TOTAL, FREET4, T3FREE, THYROIDAB in the last 72 hours. Anemia Panel: No results for input(s): VITAMINB12, FOLATE, FERRITIN, TIBC, IRON, RETICCTPCT in the last 72 hours. Sepsis Labs: No results for input(s): PROCALCITON, LATICACIDVEN in the last 168 hours.  Recent Results (from the past 240 hour(s))  Resp Panel by RT-PCR (Flu A&B, Covid) Nasopharyngeal Swab     Status: None   Collection Time: 10/30/20  9:35 AM   Specimen: Nasopharyngeal Swab; Nasopharyngeal(NP) swabs in vial transport medium  Result Value Ref Range Status   SARS Coronavirus 2 by RT PCR NEGATIVE NEGATIVE Final    Comment: (NOTE) SARS-CoV-2 target nucleic acids are NOT DETECTED.  The SARS-CoV-2 RNA is generally detectable in upper respiratory specimens during the acute phase of infection. The lowest concentration of SARS-CoV-2 viral copies this assay can detect is 138 copies/mL. A negative result does not preclude SARS-Cov-2 infection and should not be used as the sole basis for treatment or other patient management decisions. A negative result may occur with  improper specimen collection/handling, submission of specimen other than nasopharyngeal swab, presence of viral mutation(s) within the areas targeted by this assay, and inadequate number of viral copies(<138 copies/mL). A negative result must be combined with clinical observations, patient history, and epidemiological information. The expected result is Negative.  Fact Sheet for Patients:   EntrepreneurPulse.com.au  Fact Sheet for Healthcare Providers:  IncredibleEmployment.be  This test is no t yet approved or cleared by the [place] FDA and  has been authorized for detection and/or diagnosis of SARS-CoV-2 by FDA under an Emergency Use Authorization (EUA). This EUA will remain  in effect (meaning this test can be used) for the duration of the COVID-19 declaration under Section 564(b)(1) of the Act, 21 U.S.C.section 360bbb-3(b)(1), unless the authorization is terminated  or revoked sooner.       Influenza A by PCR NEGATIVE NEGATIVE Final   Influenza B by PCR NEGATIVE NEGATIVE Final    Comment: (NOTE) The Xpert Xpress SARS-CoV-2/FLU/RSV plus assay is intended as an aid in the diagnosis of influenza from Nasopharyngeal swab specimens and should not be used as a sole basis for treatment. Nasal  washings and aspirates are unacceptable for Xpert Xpress SARS-CoV-2/FLU/RSV testing.  Fact Sheet for Patients: EntrepreneurPulse.com.au  Fact Sheet for Healthcare Providers: IncredibleEmployment.be  This test is not yet approved or cleared by the [place] FDA and has been authorized for detection and/or diagnosis of SARS-CoV-2 by FDA under an Emergency Use Authorization (EUA). This EUA will remain in effect (meaning this test can be used) for the duration of the COVID-19 declaration under Section 564(b)(1) of the Act, 21 U.S.C. section 360bbb-3(b)(1), unless the authorization is terminated or revoked.  Performed at [facility], [address], [place], [place] [address]   Surgical pcr screen     Status: Abnormal   Collection Time: 10/31/20 10:14 AM   Specimen: Nasal Mucosa; Nasal Swab  Result Value Ref Range Status   MRSA, PCR POSITIVE (A) NEGATIVE Final    Comment: RESULT CALLED TO, READ BACK BY AND VERIFIED WITH: DANA LINONS 10/31/20 1210 KLW    Staphylococcus aureus POSITIVE  (A) NEGATIVE Final    Comment: (NOTE) The Xpert SA Assay (FDA approved for NASAL specimens in patients 57 years of age and older), is one component of a comprehensive surveillance program. It is not intended to diagnose infection nor to guide or monitor treatment. Performed at [facility], [address] [place], [place] [address]          Radiology Studies: No results found.      Scheduled Meds: . acetaminophen  1,000 mg Oral Q8H  . amLODipine  5 mg Oral Daily  . vitamin C  500 mg Oral Daily  . benzonatate  200 mg Oral TID  . buPROPion  150 mg Oral Daily  . busPIRone  15 mg Oral BID  . calcium-vitamin D  1 tablet Oral Daily  . Chlorhexidine Gluconate Cloth  6 each Topical Q0600  . cholecalciferol  2,000 Units Oral Daily  . citalopram  20 mg Oral BH-q7a  . docusate sodium  100 mg Oral BID  . donepezil  10 mg Oral BID  . enoxaparin (LOVENOX) injection  40 mg Subcutaneous QPM  . feeding supplement  237 mL Oral BID BM  . gabapentin  300 mg Oral QID  . lamoTRIgine  50 mg Oral QHS  . levothyroxine  50 mcg Oral QAC breakfast  . melatonin  5 mg Oral QPM  . memantine  10 mg Oral BID  . montelukast  10 mg Oral QHS  . multivitamin-lutein  1 capsule Oral BID  . mupirocin ointment   Nasal BID  . oxybutynin  5 mg Oral TID  . pantoprazole  20 mg Oral BID  . pravastatin  20 mg Oral QHS  . senna  1 tablet Oral BID  . vitamin B-12  1,000 mcg Oral Daily  . vitamin E  400 Units Oral Daily   Continuous Infusions: . sodium chloride Stopped (11/04/20 0315)  . methocarbamol (ROBAXIN) IV       LOS: 9 days    Time spent: 30 mins     Wyvonnia Dusky, MD [facility] Pager [phone]  If 7PM-7AM, please contact night-coverage 11/08/2020, 7:46 AM

## 2020-11-09 DIAGNOSIS — S72002A Fracture of unspecified part of neck of left femur, initial encounter for closed fracture: Secondary | ICD-10-CM | POA: Diagnosis not present

## 2020-11-09 DIAGNOSIS — I10 Essential (primary) hypertension: Secondary | ICD-10-CM | POA: Diagnosis not present

## 2020-11-09 DIAGNOSIS — D693 Immune thrombocytopenic purpura: Secondary | ICD-10-CM | POA: Diagnosis not present

## 2020-11-09 LAB — BASIC METABOLIC PANEL
Anion gap: 8 (ref 5–15)
BUN: 21 mg/dL (ref 8–23)
CO2: 28 mmol/L (ref 22–32)
Calcium: 8.8 mg/dL — ABNORMAL LOW (ref 8.9–10.3)
Chloride: 103 mmol/L (ref 98–111)
Creatinine, Ser: 0.86 mg/dL (ref 0.44–1.00)
GFR, Estimated: >60 mL/min (ref >60)
Glucose, Bld: 96 mg/dL (ref 70–99)
Potassium: 3.9 mmol/L (ref 3.5–5.1)
Sodium: 139 mmol/L (ref 135–145)

## 2020-11-09 LAB — PROCALCITONIN: Procalcitonin: <0.1 ng/mL

## 2020-11-09 LAB — CBC
HCT: 27.7 % — ABNORMAL LOW (ref 36.0–46.0)
Hemoglobin: 8.9 g/dL — ABNORMAL LOW (ref 12.0–15.0)
MCH: 28.5 pg (ref 26.0–34.0)
MCHC: 32.1 g/dL (ref 30.0–36.0)
MCV: 88.8 fL (ref 80.0–100.0)
Platelets: 324 10*3/uL (ref 150–400)
RBC: 3.12 MIL/uL — ABNORMAL LOW (ref 3.87–5.11)
RDW: 21.1 % — ABNORMAL HIGH (ref 11.5–15.5)
WBC: 18.4 10*3/uL — ABNORMAL HIGH (ref 4.0–10.5)
nRBC: 0.4 % — ABNORMAL HIGH (ref 0.0–0.2)

## 2020-11-09 LAB — GLUCOSE, CAPILLARY: Glucose-Capillary: 106 mg/dL — ABNORMAL HIGH (ref 70–99)

## 2020-11-09 MED ORDER — POLYETHYLENE GLYCOL 3350 17 G PO PACK
17.0000 g | PACK | Freq: Every day | ORAL | Status: DC
  Administered 2020-11-09 – 2020-11-12 (×4): 17 g via ORAL
  Filled 2020-11-09 (×4): qty 1

## 2020-11-09 NOTE — Plan of Care (Signed)

## 2020-11-09 NOTE — Progress Notes (Signed)
Physical Therapy Treatment Patient Details Name: Ariana [name] MRN: [id number] DOB: 09/22/1933 Today's Date: 11/09/2020    History of Present Illness Ariana [name] is a 87 y.o. female with medical history significant for ITP, Alzheimer's dementia, depression, hypothyroidism, anxiety disorder and asthma who arrives to the ER via EMS for evaluation of left hip pain following a fall.  Patient was walking her dog and slipped on ice, landing on her left side. S/p L ORIF on 10/31/20.    PT Comments    Pt was long sitting in bed upon arriving. C/O severe pain requiring max encouragement for OOB. Eventually pt agrees and is cooperative throughout remainder of session. Baseline cognition deficits noted however easily able to follow cues/commands consistently. Does endorse severe pain today that she did not endorse in previous sessions. Pain did not limit her abilities to progress with OOB activity. She was able to exit bed, stand to RW, and ambulate to [facility] with min assist. Does urinate 200 mls.Pt has slow antalgic step to gait pattern however no LOB or unsteadiness with BUE support. Does have poor safety awareness during session with letting go of RW at times. Overall tolerated session well. Was repositioned in recliner post session with call bell in reach and chair alarm in place. Pt will greatly benefit from continued skilled PT at DC to assist pt to PLOF. Recommend SNF to address these deficits.    Follow Up Recommendations  SNF     Equipment Recommendations  None recommended by PT    Recommendations for Other Services       Precautions / Restrictions Precautions Precautions: Fall Restrictions Weight Bearing Restrictions: Yes LLE Weight Bearing: Weight bearing as tolerated    Mobility  Bed Mobility Overal bed mobility: Needs Assistance Bed Mobility: Supine to Sit     Supine to sit: Min assist     General bed mobility comments: Min assist to exit bed with  increased time and vcs for improved sequencing and safety  Transfers Overall transfer level: Needs assistance Equipment used: Rolling walker (2 wheeled) Transfers: Sit to/from Stand Sit to Stand: Min assist         General transfer comment: Pt was able to stand from elevated surface heights with CGA. MIn assist form BSC/recliner surface poor safety awareness due to cognition.  Ambulation/Gait Ambulation/Gait assistance: Min assist Gait Distance (Feet): 12 Feet Assistive device: Rolling walker (2 wheeled) Gait Pattern/deviations: Step-to pattern;Antalgic Gait velocity: decreased   General Gait Details: Pt was able to ambulate ~ 12 ft in room with slow step to pattern. distances limited today 2/2 to pain       Balance Overall balance assessment: Needs assistance Sitting-balance support: No upper extremity supported;Feet supported Sitting balance-Leahy Scale: Good Sitting balance - Comments: no LOB sitting EOB, BSC, or in recliner   Standing balance support: Bilateral upper extremity supported;During functional activity Standing balance-Leahy Scale: Fair Standing balance comment: several times in standing impulsively lets go to RW however does not have LOB. Dynamic balance requires BUE support         Cognition Arousal/Alertness: Awake/alert Behavior During Therapy: WFL for tasks assessed/performed Overall Cognitive Status: History of cognitive impairments - at baseline    General Comments: Pt needed alot of encouragement to participate but once willing was cooperative and pleasant throughout             Pertinent Vitals/Pain Pain Assessment: 0-10 Pain Score: 9  Faces Pain Scale: Hurts whole lot Pain Location: L hip Pain Descriptors / Indicators:  Sore;Grimacing;Guarding Pain Intervention(s): Limited activity within patient's tolerance;Monitored during session;Premedicated before session;Repositioned           PT Goals (current goals can now be found in the  care plan section) Acute Rehab PT Goals Patient Stated Goal: To return home safely to see my dogs Progress towards PT goals: Progressing toward goals    Frequency    7X/week      PT Plan Current plan remains appropriate       AM-PAC PT "6 Clicks" Mobility   Outcome Measure  Help needed turning from your back to your side while in a flat bed without using bedrails?: A Little Help needed moving from lying on your back to sitting on the side of a flat bed without using bedrails?: A Little Help needed moving to and from a bed to a chair (including a wheelchair)?: A Little Help needed standing up from a chair using your arms (e.g., wheelchair or bedside chair)?: A Little Help needed to walk in hospital room?: A Little Help needed climbing 3-5 steps with a railing? : A Lot 6 Click Score: 17    End of Session Equipment Utilized During Treatment: Gait belt Activity Tolerance: Patient tolerated treatment well Patient left: in chair;with call bell/phone within reach;with chair alarm set Nurse Communication: Mobility status PT Visit Diagnosis: Muscle weakness (generalized) (M62.81);Difficulty in walking, not elsewhere classified (R26.2);Pain Pain - Right/Left: Left Pain - part of body: Hip     Time: 4580-9983 PT Time Calculation (min) (ACUTE ONLY): 24 min  Charges:  $Gait Training: 8-22 mins $Therapeutic Activity: 8-22 mins                     Julaine Fusi PTA 11/09/20, 10:02 AM

## 2020-11-09 NOTE — Progress Notes (Signed)
PROGRESS NOTE    Ariana [name]  GUR:[id number] DOB: 12/25/1932 DOA: 10/30/2020 PCP: Idelle Crouch, MD   Assessment & Plan:   Principal Problem:   Closed displaced fracture of left femoral neck ([place]) Active Problems:   Acute ITP ([place])   Acquired hypothyroidism   Depressive disorder   Benign essential hypertension   Late onset Alzheimer's disease without behavioral disturbance (HCC)   Idiopathic thrombocythemia ([place])   Hiatal hernia   GERD (gastroesophageal reflux disease)   Palliative care encounter  Closed displaced fracture of left femoral neck: secondary to mechanical fall. S/p ORIF on 10/31/20 as per ortho surg. PT/OT recs SNF. Will go to SNF on 11/11/20  Idiopathic thrombocytopenic purpura: chronic. S/p Nplate x 1, 2 units of platelets, IVIG x 2 doses as per heme.   Leukocytosis: likely reactive. No fevers. Will continue to monitor   HTN: continue on CCB   Hypothyroidism: continue on synthroid   Alzheimer's dementia: continue on aricept, namenda   Anxiety: severity unknown. Continue on home dose of xanax   Depression: severity unknown. Continue on buspar, wellbutrin.  Hernia with GERD: continue on pantoprazole    DVT prophylaxis: lovenox Code Status: DNR Family Communication:  Disposition Plan: SNF   Status is: Inpatient  Remains inpatient appropriate because:Unsafe d/c plan, will go to SNF on 11/11/20   Dispo: The patient is from: Home              Anticipated d/c is to: SNF              Anticipated d/c date is: 11/11/20              Patient currently is medically stable to d/c.   Difficult to place patient Yes  Level of care: medsurg  Consultants:   Palliative care  Ortho surg   Procedures:  Antimicrobials:  Subjective: Pt c/o having no appetite   Objective: Vitals:   11/08/20 1932 11/08/20 2342 11/09/20 0510 11/09/20 0828  BP: 112/76 (!) 108/45 (!) 140/58 (!) 153/93  Pulse: 62 (!) 59 63 (!) 59  Resp: 17 18 20 18    Temp: 98.3 F (36.8 C) 97.8 F (36.6 C) 97.7 F (36.5 C) 98.1 F (36.7 C)  TempSrc:    Oral  SpO2: 98% 94% 100% 100%  Weight:      Height:        Intake/Output Summary (Last 24 hours) at 11/09/2020 1049 Last data filed at 11/09/2020 1010 Gross per 24 hour  Intake 480 ml  Output -  Net 480 ml   Filed Weights   10/30/20 0846 10/31/20 1136  Weight: 67.1 kg 67.1 kg    Examination:  General exam: Appears uncomfortable  Respiratory system: clear breath sounds b/l. No rubs or gallops Cardiovascular system: S1/S2+. No rubs or clicks  Gastrointestinal system: Abd is soft, NT, ND & hypoactive bowel sounds  Central nervous system: Alert and awake. Moves all 4 extremities  Psychiatry: Judgement and insight appear abnormal. Flat mood and affect    Data Reviewed: I have personally reviewed following labs and imaging studies  CBC: Recent Labs  Lab 11/05/20 0612 11/06/20 0528 11/07/20 0856 11/08/20 0450 11/09/20 0520  WBC 11.1* 11.7* 15.3* 15.3* 18.4*  HGB 8.7* 8.8* 9.1* 9.1* 8.9*  HCT 26.7* 26.6* 28.2* 27.1* 27.7*  MCV 86.1 86.4 87.9 83.9 88.8  PLT 130* 203 297 293 283   Basic Metabolic Panel: Recent Labs  Lab 11/03/20 0544 11/04/20 0325 11/05/20 0612 11/06/20 0528 11/07/20 1517 11/08/20 0450  11/09/20 0520  NA 141 134* 134* 138 136 142 139  K 4.0 3.8 3.9 4.0 4.5 3.9 3.9  CL 104 98 96* 102 101 106 103  CO2 30 28 29 28 27 29 28   GLUCOSE 77 85 104* 87 88 85 96  BUN 24* 20 32* 29* 29* 21 21  CREATININE 0.80 0.75 0.98 0.86 0.90 0.85 0.86  CALCIUM 8.6* 9.0 8.9 8.8* 9.1 9.2 8.8*  MG 1.8 1.7 2.0  --   --   --   --   PHOS 3.4 4.0 5.0*  --   --   --   --    GFR: Estimated Creatinine Clearance: 43 mL/min (by C-G formula based on SCr of 0.86 mg/dL). Liver Function Tests: No results for input(s): AST, ALT, ALKPHOS, BILITOT, PROT, ALBUMIN in the last 168 hours. No results for input(s): LIPASE, AMYLASE in the last 168 hours. No results for input(s): AMMONIA in the last  168 hours. Coagulation Profile: No results for input(s): INR, PROTIME in the last 168 hours. Cardiac Enzymes: No results for input(s): CKTOTAL, CKMB, CKMBINDEX, TROPONINI in the last 168 hours. BNP (last 3 results) No results for input(s): PROBNP in the last 8760 hours. HbA1C: No results for input(s): HGBA1C in the last 72 hours. CBG: Recent Labs  Lab 11/04/20 0902  GLUCAP 74   Lipid Profile: No results for input(s): CHOL, HDL, LDLCALC, TRIG, CHOLHDL, LDLDIRECT in the last 72 hours. Thyroid Function Tests: No results for input(s): TSH, T4TOTAL, FREET4, T3FREE, THYROIDAB in the last 72 hours. Anemia Panel: No results for input(s): VITAMINB12, FOLATE, FERRITIN, TIBC, IRON, RETICCTPCT in the last 72 hours. Sepsis Labs: No results for input(s): PROCALCITON, LATICACIDVEN in the last 168 hours.  Recent Results (from the past 240 hour(s))  Surgical pcr screen     Status: Abnormal   Collection Time: 10/31/20 10:14 AM   Specimen: Nasal Mucosa; Nasal Swab  Result Value Ref Range Status   MRSA, PCR POSITIVE (A) NEGATIVE Final    Comment: RESULT CALLED TO, READ BACK BY AND VERIFIED WITH: DANA LINONS 10/31/20 1210 KLW    Staphylococcus aureus POSITIVE (A) NEGATIVE Final    Comment: (NOTE) The Xpert SA Assay (FDA approved for NASAL specimens in patients 93 years of age and older), is one component of a comprehensive surveillance program. It is not intended to diagnose infection nor to guide or monitor treatment. Performed at [facility], [address], [place], [place] [address]          Radiology Studies: No results found.      Scheduled Meds: . acetaminophen  1,000 mg Oral Q8H  . amLODipine  5 mg Oral Daily  . vitamin C  500 mg Oral Daily  . benzonatate  200 mg Oral TID  . buPROPion  150 mg Oral Daily  . busPIRone  15 mg Oral BID  . calcium-vitamin D  1 tablet Oral Daily  . Chlorhexidine Gluconate Cloth  6 each Topical Q0600  . cholecalciferol  2,000  Units Oral Daily  . citalopram  20 mg Oral BH-q7a  . docusate sodium  100 mg Oral BID  . donepezil  10 mg Oral BID  . enoxaparin (LOVENOX) injection  40 mg Subcutaneous QPM  . feeding supplement  237 mL Oral BID BM  . gabapentin  300 mg Oral QID  . lamoTRIgine  50 mg Oral QHS  . levothyroxine  50 mcg Oral QAC breakfast  . melatonin  5 mg Oral QPM  . memantine  10 mg Oral BID  . montelukast  10 mg Oral QHS  . multivitamin-lutein  1 capsule Oral BID  . mupirocin ointment   Nasal BID  . oxybutynin  5 mg Oral TID  . pantoprazole  20 mg Oral BID  . polyethylene glycol  17 g Oral Daily  . pravastatin  20 mg Oral QHS  . senna  1 tablet Oral BID  . vitamin B-12  1,000 mcg Oral Daily  . vitamin E  400 Units Oral Daily   Continuous Infusions: . methocarbamol (ROBAXIN) IV       LOS: 10 days    Time spent: 31 mins     Wyvonnia Dusky, MD [facility] Pager [phone]  If 7PM-7AM, please contact night-coverage 11/09/2020, 10:49 AM

## 2020-11-10 DIAGNOSIS — D693 Immune thrombocytopenic purpura: Secondary | ICD-10-CM | POA: Diagnosis not present

## 2020-11-10 DIAGNOSIS — S72002A Fracture of unspecified part of neck of left femur, initial encounter for closed fracture: Secondary | ICD-10-CM | POA: Diagnosis not present

## 2020-11-10 DIAGNOSIS — I10 Essential (primary) hypertension: Secondary | ICD-10-CM | POA: Diagnosis not present

## 2020-11-10 LAB — CBC
HCT: 28.1 % — ABNORMAL LOW (ref 36.0–46.0)
Hemoglobin: 8.9 g/dL — ABNORMAL LOW (ref 12.0–15.0)
MCH: 28.3 pg (ref 26.0–34.0)
MCHC: 31.7 g/dL (ref 30.0–36.0)
MCV: 89.5 fL (ref 80.0–100.0)
Platelets: 336 10*3/uL (ref 150–400)
RBC: 3.14 MIL/uL — ABNORMAL LOW (ref 3.87–5.11)
RDW: 21.1 % — ABNORMAL HIGH (ref 11.5–15.5)
WBC: 19 10*3/uL — ABNORMAL HIGH (ref 4.0–10.5)
nRBC: 0.5 % — ABNORMAL HIGH (ref 0.0–0.2)

## 2020-11-10 LAB — BASIC METABOLIC PANEL
Anion gap: 9 (ref 5–15)
BUN: 22 mg/dL (ref 8–23)
CO2: 27 mmol/L (ref 22–32)
Calcium: 8.9 mg/dL (ref 8.9–10.3)
Chloride: 100 mmol/L (ref 98–111)
Creatinine, Ser: 1.04 mg/dL — ABNORMAL HIGH (ref 0.44–1.00)
GFR, Estimated: 53 mL/min — ABNORMAL LOW (ref >60)
Glucose, Bld: 89 mg/dL (ref 70–99)
Potassium: 4.5 mmol/L (ref 3.5–5.1)
Sodium: 136 mmol/L (ref 135–145)

## 2020-11-10 LAB — SARS CORONAVIRUS 2 (TAT 6-24 HRS): SARS Coronavirus 2: NEGATIVE

## 2020-11-10 NOTE — Progress Notes (Signed)
PROGRESS NOTE    Ariana [name]  [id number] DOB: Apr 19, 1933 DOA: 10/30/2020 PCP: Idelle Crouch, MD   Assessment & Plan:   Principal Problem:   Closed displaced fracture of left femoral neck ([place]) Active Problems:   Acute ITP ([place])   Acquired hypothyroidism   Depressive disorder   Benign essential hypertension   Late onset Alzheimer's disease without behavioral disturbance (HCC)   Idiopathic thrombocythemia ([place])   Hiatal hernia   GERD (gastroesophageal reflux disease)   Palliative care encounter  Closed displaced fracture of left femoral neck: secondary to mechanical fall. S/p ORIF on 10/31/20 as per ortho surg. Will go to SNF 11/11/20  Idiopathic thrombocytopenic purpura: chronic. S/p Nplate x 1, 2 units of platelets, IVIG x 2 doses as per heme.   Leukocytosis: etiology unclear, likely reactive. No fevers, CXR was neg. Will continue to monitor   HTN: continue on amlodipine   Hypothyroidism: continue on levothyroxine   Alzheimer's dementia: continue on namenda, aricept   Anxiety: severity unknown. Continue on home dose of xanax   Depression: severity unknown. Continue on buspar, wellbutrin.  Hernia with GERD: continue on PPI   DVT prophylaxis: lovenox Code Status: DNR Family Communication:  Disposition Plan: SNF   Status is: Inpatient  Remains inpatient appropriate because:Unsafe d/c plan, will go to SNF on 11/11/20   Dispo: The patient is from: Home              Anticipated d/c is to: SNF              Anticipated d/c date is: 11/11/20              Patient currently is medically stable to d/c.   Difficult to place patient Yes  Level of care: medsurg  Consultants:   Palliative care  Ortho surg   Procedures:  Antimicrobials:  Subjective: Pt c/o fatigue  Objective: Vitals:   11/09/20 1529 11/09/20 2115 11/10/20 0017 11/10/20 0542  BP: (!) 143/58 (!) 142/63 (!) 131/43 112/62  Pulse: 65 69 65 61  Resp: 19 17 15 17   Temp:  98.6 F (37 C) 97.7 F (36.5 C) 98.5 F (36.9 C) 97.6 F (36.4 C)  TempSrc:  Oral  Oral  SpO2: 98% 99% 98% 99%  Weight:      Height:        Intake/Output Summary (Last 24 hours) at 11/10/2020 0723 Last data filed at 11/09/2020 1831 Gross per 24 hour  Intake 760 ml  Output -  Net 760 ml   Filed Weights   10/30/20 0846 10/31/20 1136  Weight: 67.1 kg 67.1 kg    Examination:  General exam: Appears comfortable  Respiratory system: clear breath sounds b/l  Cardiovascular system: S1/S2+. No rubs or clicks  Gastrointestinal system: Abd is soft, NT, ND & normal bowel sounds  Central nervous system: Alert and awake. Moves all 4 extremities  Psychiatry: Judgement and insight appear abnormal. Flat mood and affect    Data Reviewed: I have personally reviewed following labs and imaging studies  CBC: Recent Labs  Lab 11/06/20 0528 11/07/20 0856 11/08/20 0450 11/09/20 0520 11/10/20 0513  WBC 11.7* 15.3* 15.3* 18.4* 19.0*  HGB 8.8* 9.1* 9.1* 8.9* 8.9*  HCT 26.6* 28.2* 27.1* 27.7* 28.1*  MCV 86.4 87.9 83.9 88.8 89.5  PLT 203 297 293 324 [id number]   Basic Metabolic Panel: Recent Labs  Lab 11/04/20 0325 11/05/20 0612 11/06/20 0528 11/07/20 0621 11/08/20 0450 11/09/20 0520 11/10/20 0513  NA 134* 134* 138 136  142 139 136  K 3.8 3.9 4.0 4.5 3.9 3.9 4.5  CL 98 96* 102 101 106 103 100  CO2 28 29 28 27 29 28 27   GLUCOSE 85 104* 87 88 85 96 89  BUN 20 32* 29* 29* 21 21 22   CREATININE 0.75 0.98 0.86 0.90 0.85 0.86 1.04*  CALCIUM 9.0 8.9 8.8* 9.1 9.2 8.8* 8.9  MG 1.7 2.0  --   --   --   --   --   PHOS 4.0 5.0*  --   --   --   --   --    GFR: Estimated Creatinine Clearance: 35.5 mL/min (A) (by C-G formula based on SCr of 1.04 mg/dL (H)). Liver Function Tests: No results for input(s): AST, ALT, ALKPHOS, BILITOT, PROT, ALBUMIN in the last 168 hours. No results for input(s): LIPASE, AMYLASE in the last 168 hours. No results for input(s): AMMONIA in the last 168 hours. Coagulation  Profile: No results for input(s): INR, PROTIME in the last 168 hours. Cardiac Enzymes: No results for input(s): CKTOTAL, CKMB, CKMBINDEX, TROPONINI in the last 168 hours. BNP (last 3 results) No results for input(s): PROBNP in the last 8760 hours. HbA1C: No results for input(s): HGBA1C in the last 72 hours. CBG: Recent Labs  Lab 11/04/20 0902 11/09/20 2114  GLUCAP 74 106*   Lipid Profile: No results for input(s): CHOL, HDL, LDLCALC, TRIG, CHOLHDL, LDLDIRECT in the last 72 hours. Thyroid Function Tests: No results for input(s): TSH, T4TOTAL, FREET4, T3FREE, THYROIDAB in the last 72 hours. Anemia Panel: No results for input(s): VITAMINB12, FOLATE, FERRITIN, TIBC, IRON, RETICCTPCT in the last 72 hours. Sepsis Labs: Recent Labs  Lab 11/09/20 0520  PROCALCITON <0.10    Recent Results (from the past 240 hour(s))  Surgical pcr screen     Status: Abnormal   Collection Time: 10/31/20 10:14 AM   Specimen: Nasal Mucosa; Nasal Swab  Result Value Ref Range Status   MRSA, PCR POSITIVE (A) NEGATIVE Final    Comment: RESULT CALLED TO, READ BACK BY AND VERIFIED WITH: DANA LINONS 10/31/20 1210 KLW    Staphylococcus aureus POSITIVE (A) NEGATIVE Final    Comment: (NOTE) The Xpert SA Assay (FDA approved for NASAL specimens in patients 1 years of age and older), is one component of a comprehensive surveillance program. It is not intended to diagnose infection nor to guide or monitor treatment. Performed at [facility], [address], [place], [place] [address]          Radiology Studies: No results found.      Scheduled Meds: . acetaminophen  1,000 mg Oral Q8H  . amLODipine  5 mg Oral Daily  . vitamin C  500 mg Oral Daily  . benzonatate  200 mg Oral TID  . buPROPion  150 mg Oral Daily  . busPIRone  15 mg Oral BID  . calcium-vitamin D  1 tablet Oral Daily  . Chlorhexidine Gluconate Cloth  6 each Topical Q0600  . cholecalciferol  2,000 Units Oral Daily  .  citalopram  20 mg Oral BH-q7a  . docusate sodium  100 mg Oral BID  . donepezil  10 mg Oral BID  . enoxaparin (LOVENOX) injection  40 mg Subcutaneous QPM  . feeding supplement  237 mL Oral BID BM  . gabapentin  300 mg Oral QID  . lamoTRIgine  50 mg Oral QHS  . levothyroxine  50 mcg Oral QAC breakfast  . melatonin  5 mg Oral QPM  .  memantine  10 mg Oral BID  . montelukast  10 mg Oral QHS  . multivitamin-lutein  1 capsule Oral BID  . mupirocin ointment   Nasal BID  . oxybutynin  5 mg Oral TID  . pantoprazole  20 mg Oral BID  . polyethylene glycol  17 g Oral Daily  . pravastatin  20 mg Oral QHS  . senna  1 tablet Oral BID  . vitamin B-12  1,000 mcg Oral Daily  . vitamin E  400 Units Oral Daily   Continuous Infusions: . methocarbamol (ROBAXIN) IV       LOS: 11 days    Time spent: 30 mins     Wyvonnia Dusky, MD [facility] Pager [phone]  If 7PM-7AM, please contact night-coverage 11/10/2020, 7:23 AM

## 2020-11-10 NOTE — Progress Notes (Signed)
Physical Therapy Treatment Patient Details Name: Ariana [name] MRN: [id number] DOB: 1933-09-20 Today's Date: 11/10/2020    History of Present Illness Ariana [name] is a 87 y.o. female with medical history significant for ITP, Alzheimer's dementia, depression, hypothyroidism, anxiety disorder and asthma who arrives to the ER via EMS for evaluation of left hip pain following a fall.  Patient was walking her dog and slipped on ice, landing on her left side. S/p L ORIF on 10/31/20.    PT Comments    Pt on commode upon arrival.  Small hard formed BM.  Stood and is able to walk 10' x 3 with RW and min a x 1 self limited by pain.  Poor overall safety and gait quality.  She attempts BM again during session with no results and stated she is constipated.  Relayed to RN.  Pt remained in recliner after session.   Follow Up Recommendations  SNF     Equipment Recommendations  None recommended by PT    Recommendations for Other Services       Precautions / Restrictions Precautions Precautions: Fall Restrictions Weight Bearing Restrictions: Yes LLE Weight Bearing: Weight bearing as tolerated    Mobility  Bed Mobility                  Transfers Overall transfer level: Needs assistance Equipment used: Rolling walker (2 wheeled) Transfers: Sit to/from Stand Sit to Stand: Min assist            Ambulation/Gait Ambulation/Gait assistance: Min assist Gait Distance (Feet): 10 Feet Assistive device: Rolling walker (2 wheeled) Gait Pattern/deviations: Step-to pattern;Antalgic Gait velocity: decreased with poor safety and quality,self  limiting WB on LLE   General Gait Details: 10' x 3   Stairs             Wheelchair Mobility    Modified Rankin (Stroke Patients Only)       Balance Overall balance assessment: Needs assistance Sitting-balance support: No upper extremity supported;Feet supported Sitting balance-Leahy Scale: Good      Standing balance support: Bilateral upper extremity supported;During functional activity Standing balance-Leahy Scale: Fair Standing balance comment: several times in standing impulsively lets go to RW however does not have LOB. Dynamic balance requires BUE support                            Cognition Arousal/Alertness: Awake/alert Behavior During Therapy: WFL for tasks assessed/performed Overall Cognitive Status: History of cognitive impairments - at baseline                                        Exercises Other Exercises Other Exercises: to commode x 2 during session.  very small hard BM's.    General Comments        Pertinent Vitals/Pain Pain Assessment: Faces Faces Pain Scale: Hurts even more Pain Location: L hip Pain Descriptors / Indicators: Sore;Grimacing;Guarding Pain Intervention(s): Limited activity within patient's tolerance;Monitored during session;Repositioned    Home Living                      Prior Function            PT Goals (current goals can now be found in the care plan section) Progress towards PT goals: Progressing toward goals    Frequency    7X/week  PT Plan Current plan remains appropriate    Co-evaluation              AM-PAC PT "6 Clicks" Mobility   Outcome Measure  Help needed turning from your back to your side while in a flat bed without using bedrails?: A Little Help needed moving from lying on your back to sitting on the side of a flat bed without using bedrails?: A Little Help needed moving to and from a bed to a chair (including a wheelchair)?: A Little Help needed standing up from a chair using your arms (e.g., wheelchair or bedside chair)?: A Little Help needed to walk in hospital room?: A Little Help needed climbing 3-5 steps with a railing? : A Lot 6 Click Score: 17    End of Session Equipment Utilized During Treatment: Gait belt Activity Tolerance: Patient limited by  pain;Other (comment) Patient left: in chair;with call bell/phone within reach;with chair alarm set Nurse Communication: Mobility status;Other (comment) Pain - Right/Left: Left Pain - part of body: Hip     Time: 1287-8676 PT Time Calculation (min) (ACUTE ONLY): 14 min  Charges:  $Gait Training: 8-22 mins                    Chesley Noon, PTA 11/10/20, 9:42 AM

## 2020-11-11 LAB — BASIC METABOLIC PANEL
Anion gap: 7 (ref 5–15)
BUN: 25 mg/dL — ABNORMAL HIGH (ref 8–23)
CO2: 26 mmol/L (ref 22–32)
Calcium: 9.1 mg/dL (ref 8.9–10.3)
Chloride: 101 mmol/L (ref 98–111)
Creatinine, Ser: 0.97 mg/dL (ref 0.44–1.00)
GFR, Estimated: 57 mL/min — ABNORMAL LOW (ref >60)
Glucose, Bld: 102 mg/dL — ABNORMAL HIGH (ref 70–99)
Potassium: 5 mmol/L (ref 3.5–5.1)
Sodium: 134 mmol/L — ABNORMAL LOW (ref 135–145)

## 2020-11-11 LAB — CBC
HCT: 27.8 % — ABNORMAL LOW (ref 36.0–46.0)
Hemoglobin: 8.5 g/dL — ABNORMAL LOW (ref 12.0–15.0)
MCH: 27.7 pg (ref 26.0–34.0)
MCHC: 30.6 g/dL (ref 30.0–36.0)
MCV: 90.6 fL (ref 80.0–100.0)
Platelets: 426 10*3/uL — ABNORMAL HIGH (ref 150–400)
RBC: 3.07 MIL/uL — ABNORMAL LOW (ref 3.87–5.11)
RDW: 21.6 % — ABNORMAL HIGH (ref 11.5–15.5)
WBC: 16.6 10*3/uL — ABNORMAL HIGH (ref 4.0–10.5)
nRBC: 0.5 % — ABNORMAL HIGH (ref 0.0–0.2)

## 2020-11-11 NOTE — Care Management Important Message (Signed)
Important Message  Patient Details  Name: Ariana [name] MRN: [id number] Date of Birth: 17-May-1933   Medicare Important Message Given:  Yes     Juliann Pulse A Brayden Betters 11/11/2020, 11:57 AM

## 2020-11-11 NOTE — Progress Notes (Signed)
PROGRESS NOTE    Ariana [name]  JOI:[id number] DOB: [birth year] DOA: 10/30/2020 PCP: Idelle Crouch, MD   Assessment & Plan:   Principal Problem:   Closed displaced fracture of left femoral neck ([place]) Active Problems:   Acute ITP ([place])   Acquired hypothyroidism   Depressive disorder   Benign essential hypertension   Late onset Alzheimer's disease without behavioral disturbance (HCC)   Idiopathic thrombocythemia ([place])   Hiatal hernia   GERD (gastroesophageal reflux disease)   Palliative care encounter  Closed displaced fracture of left femoral neck: secondary to mechanical fall. S/p ORIF on 10/31/20 as per ortho surg. Will go SNF possibly tomorrow 11/12/20   Idiopathic thrombocytopenic purpura: chronic. S/p Nplate x 1, 2 units of platelets, IVIG x 2 doses as per heme.   Leukocytosis: etiology unclear, likely reactive, trending down. No fevers. CXR neg  HTN: continue on BB   Hypothyroidism: continue on synthroid   Alzheimer's dementia: continue on aricept, namenda   Anxiety: severity unknown. Continue on home dose of xanax   Depression: severity unknown. Continue on buspar, wellbutrin.  Hernia with GERD: continue on pantoprazole    DVT prophylaxis: lovenox Code Status: DNR Family Communication:  Disposition Plan: SNF, was previously told pt can d/c today but pt cant go today secondary to some undisclosed reason and hopefully will go tomorrow    Status is: Inpatient  Remains inpatient appropriate because:Unsafe d/c plan, hopefully will go tomorrow   Dispo: The patient is from: Home              Anticipated d/c is to: SNF              Anticipated d/c date OB:[id number] the SNF will take the pt               Patient currently is medically stable to d/c.   Difficult to place patient Yes  Level of care: medsurg  Consultants:   Palliative care  Ortho surg   Procedures:  Antimicrobials:  Subjective: Pt c/o malaise   Objective: Vitals:    11/10/20 1534 11/10/20 2001 11/11/20 0014 11/11/20 0500  BP: (!) 128/54 129/68 (!) 121/55 (!) 129/53  Pulse: 61 77 64 63  Resp: 15 16 17 17   Temp: 97.9 F (36.6 C) 98.1 F (36.7 C) 98.1 F (36.7 C) 97.8 F (36.6 C)  TempSrc:  Oral Oral   SpO2: 100% 97% 99% 97%  Weight:      Height:        Intake/Output Summary (Last 24 hours) at 11/11/2020 1427 Last data filed at 11/11/2020 1020 Gross per 24 hour  Intake 0 ml  Output -  Net 0 ml   Filed Weights   10/30/20 0846 10/31/20 1136  Weight: 67.1 kg 67.1 kg    Examination:  General exam: Appears comfortable  Respiratory system: clear breath sounds b/l  Cardiovascular system: S1/S2 +. No rubs or gallops  Gastrointestinal system: Abd is soft, NT, ND, & hypoactive bowel sounds  Central nervous system: Alert and awake. Moves all 4 extremities  Psychiatry: Judgement and insight appear abnormal. Flat mood and affect    Data Reviewed: I have personally reviewed following labs and imaging studies  CBC: Recent Labs  Lab 11/07/20 0856 11/08/20 0450 11/09/20 0520 11/10/20 0513 11/11/20 0348  WBC 15.3* 15.3* 18.4* 19.0* 16.6*  HGB 9.1* 9.1* 8.9* 8.9* 8.5*  HCT 28.2* 27.1* 27.7* 28.1* 27.8*  MCV 87.9 83.9 88.8 89.5 90.6  PLT 297 293 324 336 426*  Basic Metabolic Panel: Recent Labs  Lab 11/05/20 0612 11/06/20 0528 11/07/20 [id number] 11/08/20 0450 11/09/20 0520 11/10/20 0513 11/11/20 0348  NA 134*   < > 136 142 139 136 134*  K 3.9   < > 4.5 3.9 3.9 4.5 5.0  CL 96*   < > 101 106 103 100 101  CO2 29   < > 27 29 28 27 26   GLUCOSE 104*   < > 88 85 96 89 102*  BUN 32*   < > 29* 21 21 22  25*  CREATININE 0.98   < > 0.90 0.85 0.86 1.04* 0.97  CALCIUM 8.9   < > 9.1 9.2 8.8* 8.9 9.1  MG 2.0  --   --   --   --   --   --   PHOS 5.0*  --   --   --   --   --   --    < > = values in this interval not displayed.   GFR: Estimated Creatinine Clearance: 38.1 mL/min (by C-G formula based on SCr of 0.97 mg/dL). Liver Function Tests: No  results for input(s): AST, ALT, ALKPHOS, BILITOT, PROT, ALBUMIN in the last 168 hours. No results for input(s): LIPASE, AMYLASE in the last 168 hours. No results for input(s): AMMONIA in the last 168 hours. Coagulation Profile: No results for input(s): INR, PROTIME in the last 168 hours. Cardiac Enzymes: No results for input(s): CKTOTAL, CKMB, CKMBINDEX, TROPONINI in the last 168 hours. BNP (last 3 results) No results for input(s): PROBNP in the last 8760 hours. HbA1C: No results for input(s): HGBA1C in the last 72 hours. CBG: Recent Labs  Lab 11/09/20 2114  GLUCAP 106*   Lipid Profile: No results for input(s): CHOL, HDL, LDLCALC, TRIG, CHOLHDL, LDLDIRECT in the last 72 hours. Thyroid Function Tests: No results for input(s): TSH, T4TOTAL, FREET4, T3FREE, THYROIDAB in the last 72 hours. Anemia Panel: No results for input(s): VITAMINB12, FOLATE, FERRITIN, TIBC, IRON, RETICCTPCT in the last 72 hours. Sepsis Labs: Recent Labs  Lab 11/09/20 0520  PROCALCITON <0.10    Recent Results (from the past 240 hour(s))  SARS CORONAVIRUS 2 (TAT 6-24 HRS) Nasopharyngeal Nasopharyngeal Swab     Status: None   Collection Time: 11/10/20 11:40 AM   Specimen: Nasopharyngeal Swab  Result Value Ref Range Status   SARS Coronavirus 2 NEGATIVE NEGATIVE Final    Comment: (NOTE) SARS-CoV-2 target nucleic acids are NOT DETECTED.  The SARS-CoV-2 RNA is generally detectable in upper and lower respiratory specimens during the acute phase of infection. Negative results do not preclude SARS-CoV-2 infection, do not rule out co-infections with other pathogens, and should not be used as the sole basis for treatment or other patient management decisions. Negative results must be combined with clinical observations, patient history, and epidemiological information. The expected result is Negative.  Fact Sheet for Patients: SugarRoll.be  Fact Sheet for Healthcare  Providers: https://www.woods-mathews.com/  This test is not yet approved or cleared by the [place] FDA and  has been authorized for detection and/or diagnosis of SARS-CoV-2 by FDA under an Emergency Use Authorization (EUA). This EUA will remain  in effect (meaning this test can be used) for the duration of the COVID-19 declaration under Se ction 564(b)(1) of the Act, 21 U.S.C. section 360bbb-3(b)(1), unless the authorization is terminated or revoked sooner.  Performed at [facility], [place] [address], [place], [place] [address]          Radiology Studies: No results found.  Scheduled Meds: . acetaminophen  1,000 mg Oral Q8H  . amLODipine  5 mg Oral Daily  . vitamin C  500 mg Oral Daily  . benzonatate  200 mg Oral TID  . buPROPion  150 mg Oral Daily  . busPIRone  15 mg Oral BID  . calcium-vitamin D  1 tablet Oral Daily  . Chlorhexidine Gluconate Cloth  6 each Topical Q0600  . cholecalciferol  2,000 Units Oral Daily  . citalopram  20 mg Oral BH-q7a  . docusate sodium  100 mg Oral BID  . donepezil  10 mg Oral BID  . enoxaparin (LOVENOX) injection  40 mg Subcutaneous QPM  . feeding supplement  237 mL Oral BID BM  . gabapentin  300 mg Oral QID  . lamoTRIgine  50 mg Oral QHS  . levothyroxine  50 mcg Oral QAC breakfast  . melatonin  5 mg Oral QPM  . memantine  10 mg Oral BID  . montelukast  10 mg Oral QHS  . multivitamin-lutein  1 capsule Oral BID  . mupirocin ointment   Nasal BID  . oxybutynin  5 mg Oral TID  . pantoprazole  20 mg Oral BID  . polyethylene glycol  17 g Oral Daily  . pravastatin  20 mg Oral QHS  . senna  1 tablet Oral BID  . vitamin B-12  1,000 mcg Oral Daily  . vitamin E  400 Units Oral Daily   Continuous Infusions: . methocarbamol (ROBAXIN) IV       LOS: 12 days    Time spent: 30 mins     Wyvonnia Dusky, MD [facility] Pager [phone]  If 7PM-7AM, please contact night-coverage 11/11/2020, 2:27  PM

## 2020-11-11 NOTE — Progress Notes (Signed)
Patient is now 11 days s/p left hip percutaneous pinning.  Honeycomb dressing was removed from the left hip, incision is healing well without evidence for infection.  Moderate ecchymosis to the left thigh.  Staples were removed and steri-strips were applied without issue.  Patient can get the incision wet at this time.  Plan for follow-up with [facility] in 2 weeks for x-rays of the left hip.  Stable for discharge to SNF from Orthopaedic standpoint today.  Raquel James, PA-C [place]

## 2020-11-11 NOTE — Discharge Summary (Signed)
Physician Discharge Summary  Ariana [name] BJS:[id number] DOB: May 23, 1933 DOA: 10/30/2020  PCP: Idelle Crouch, MD  Admit date: 10/30/2020 Discharge date: 11/12/20  Admitted From: home Disposition:  Home w/ home health   Recommendations for Outpatient Follow-up:  1. Follow up with PCP in 1-2 weeks 2. F/u ortho surg, Dr. Leim Fabry, in 2 weeks 3. F/u oncology in 2-3 weeks   Home Health: yes Equipment/Devices:  Discharge Condition: stable  CODE STATUS: full  Diet recommendation: Heart Healthy   Brief/Interim Summary: HPI was taken from Dr. Francine Graven: Ariana [name] is a 87 y.o. female with medical history significant for ITP, Alzheimer's dementia, depression, hypothyroidism, anxiety disorder and asthma who arrives to the ER via EMS for evaluation of left hip pain following a fall.  Patient was walking her dog and slipped on ice, landing on her left side.  She was unable to get up or bear weight on her left side and so EMS was called.  Patient with complaints of left shoulder, left hip and pain in the left side of the face. She denies any loss of consciousness or headache.  She denies having any chest pain, no shortness of breath, no nausea, no vomiting, no diarrhea, no abdominal pain, no urinary frequency, nocturia or dysuria.  No cough, no fever, no chills, no dizziness or lightheadedness. Labs show sodium 140, potassium 3.7, chloride 105, bicarb 25, glucose 109, BUN 20, creatinine 0.92, calcium 9.3, alk phos 90, albumin 3.7, AST 30, ALT 14, total protein 6.1, troponin 10, white count 13.5, hemoglobin 10.7, hematocrit 34.7, MCV 89, RDW 17, platelet count 32,, PT 13.3, INR 1.1 Respiratory viral panel is negative Chest x-ray reviewed by me shows no acute findings X-ray of the left hip shows cortical irregularity and slight angulation in the subcapital left femoral neck suspicious for nondisplaced subcapital left femoral neck fracture. No left hip dislocation. Consider  left hip CT for further evaluation. CT scan of the pelvis without contrast shows LEFT femoral neck subcapital impaction fracture. Twelve-lead EKG reviewed by me shows sinus rhythm with PACs, left bundle branch block and left axis deviation.   ED Course: Patient is an 87 year old female with a history of ITP, dementia and depression who presents to the ER for evaluation following a fall.  She has a left femoral neck fracture and will be admitted to the hospital for further evaluation.   Hospital Course from Dr. Jimmye Norman 1/25-1/31/22: Pt presented after a mechanical fall and was found to have a closed displaced fracture of the left femoral neck. Pt is s/p ORIF on 10/31/20 as per ortho surg. Will f/u outpatient w/ ortho surg in 2 weeks. PT/OT eval pt and recommended SNF but at last minute pt's husband & pt changed their minds and decided to go home w/ home health. Of note, pt has hx of ITP and is s/p Nplate, platelets & IVIG. Platelets are now back WNL. For more information, please see previous progress/consult notes.   Discharge Diagnoses:  Principal Problem:   Closed displaced fracture of left femoral neck (HCC) Active Problems:   Acute ITP (HCC)   Acquired hypothyroidism   Depressive disorder   Benign essential hypertension   Late onset Alzheimer's disease without behavioral disturbance (HCC)   Idiopathic thrombocythemia (HCC)   Hiatal hernia   GERD (gastroesophageal reflux disease)   Palliative care encounter  Closed displaced fracture of left femoral neck: secondary to mechanical fall. S/p ORIF on 10/31/20 as per ortho surg.   Idiopathic thrombocytopenic purpura: chronic.  S/p Nplate x 1, 2 units of platelets, IVIG x 2 doses as per heme.   Leukocytosis: etiology unclear, likely reactive. No fevers, CXR was neg. Will continue to monitor   HTN: continue on amlodipine   Hypothyroidism: continue on levothyroxine   Alzheimer's dementia: continue on namenda, aricept   Anxiety:  severity unknown. Continue on home dose of xanax   Depression: severity unknown. Continue on buspar, wellbutrin.  Hernia with GERD: continue on PPI  Discharge Instructions  Discharge Instructions    Diet - low sodium heart healthy   Complete by: As directed    Discharge instructions   Complete by: As directed    F/u ortho surg, Dr. Serita Grit, in 2 weeks. F/u oncology in 2-3 weeks. F/u PCP in 1-2 weeks   Increase activity slowly   Complete by: As directed    No wound care   Complete by: As directed      Allergies as of 11/12/2020      Reactions   Aspirin    Upset stomach   Diazepam Itching   sneezing   Other    seasonal allergies   Tetanus Toxoids    Localized superficial swelling of skin   Morphine Itching, Rash      Medication List    TAKE these medications   acetaminophen 325 MG tablet Commonly known as: TYLENOL Take 2 tablets (650 mg total) by mouth every 6 (six) hours as needed for mild pain (or Fever >/= 101).   albuterol 108 (90 Base) MCG/ACT inhaler Commonly known as: VENTOLIN HFA Inhale 2 puffs into the lungs every 6 (six) hours as needed for wheezing or shortness of breath.   ALPRAZolam 0.5 MG tablet Commonly known as: XANAX Take 1 tablet (0.5 mg total) by mouth 3 (three) times daily as needed for anxiety.   amLODipine 5 MG tablet Commonly known as: NORVASC Take 1 tablet (5 mg total) by mouth daily. Start taking on: November 13, 2020   benzonatate 200 MG capsule Commonly known as: TESSALON Take 200 mg by mouth 3 (three) times daily.   buPROPion 150 MG 24 hr tablet Commonly known as: WELLBUTRIN XL Take 150 mg by mouth every morning.   busPIRone 15 MG tablet Commonly known as: BUSPAR Take 15 mg by mouth 2 (two) times daily.   Calcium 600+D Plus Minerals 600-400 MG-UNIT Tabs Take 1 tablet by mouth daily.   citalopram 40 MG tablet Commonly known as: CELEXA Take 40 mg by mouth every morning.   Cyanocobalamin 1000 MCG Tbcr Take 1,000 mcg by  mouth daily.   donepezil 10 MG tablet Commonly known as: ARICEPT Take 10 mg by mouth 2 (two) times daily.   feeding supplement Liqd Take 237 mLs by mouth 2 (two) times daily between meals. What changed: when to take this   fluticasone 50 MCG/ACT nasal spray Commonly known as: FLONASE Place 2 sprays into both nostrils daily as needed for allergies.   Fluticasone-Salmeterol 250-50 MCG/DOSE Aepb Commonly known as: ADVAIR Inhale 1 puff into the lungs 2 (two) times daily as needed (shortness of breath).   gabapentin 300 MG capsule Commonly known as: NEURONTIN Take 300 mg by mouth 4 (four) times daily.   lamoTRIgine 25 MG tablet Commonly known as: LAMICTAL Take 50 mg by mouth at bedtime.   levothyroxine 50 MCG tablet Commonly known as: SYNTHROID Take 50 mcg by mouth daily before breakfast. Take 30 to 60 minutes before breakfast.   LUBRICATING EYE DROPS OP Place 1 drop into both  eyes daily as needed (dry eyes).   memantine 10 MG tablet Commonly known as: NAMENDA Take 10 mg by mouth 2 (two) times daily.   montelukast 10 MG tablet Commonly known as: SINGULAIR Take 10 mg by mouth at bedtime.   Multi-Vitamin tablet Take 1 tablet by mouth daily.   Narcan 4 MG/0.1ML Liqd nasal spray kit Generic drug: naloxone Place 1 spray into the nose once.   oxybutynin 5 MG tablet Commonly known as: DITROPAN Take 5 mg by mouth 3 (three) times daily.   oxyCODONE-acetaminophen 10-325 MG tablet Commonly known as: PERCOCET Take 1 tablet by mouth every 6 (six) hours as needed for moderate pain or severe pain.   pantoprazole 20 MG tablet Commonly known as: PROTONIX Take 20 mg by mouth 2 (two) times daily.   pravastatin 20 MG tablet Commonly known as: PRAVACHOL Take 20 mg by mouth at bedtime.   PreserVision AREDS 2 Caps Take 1 capsule by mouth 2 (two) times a day.   traMADol 50 MG tablet Commonly known as: ULTRAM Take 1 tablet (50 mg total) by mouth every 6 (six) hours as needed  for moderate pain.   vitamin C 500 MG tablet Commonly known as: ASCORBIC ACID Take 500 mg by mouth daily.   Vitamin D 50 MCG (2000 UT) tablet Take 2,000 Units by mouth daily.   vitamin E 180 MG (400 UNITS) capsule Take 400 Units by mouth daily.       Contact information for follow-up providers    Reche Dixon, [place] On 11/26/2020.   Specialty: Orthopedic Surgery Why: For staple removal and left hip x-rays;  @ 10:30 am Contact information: [address] [place] [address] [phone]            Contact information for after-discharge care    [facility] [facility].   Service: Skilled Nursing Contact information: Louise Vanlue [phone]                 Allergies  Allergen Reactions  . Aspirin     Upset stomach  . Diazepam Itching    sneezing  . Other     seasonal allergies  . Tetanus Toxoids     Localized superficial swelling of skin  . Morphine Itching and Rash    Consultations:  Ortho surg   Oncology    Procedures/Studies: DG Chest 1 View  Result Date: 10/30/2020 CLINICAL DATA:  Fall. EXAM: CHEST  1 VIEW COMPARISON:  Chest x-ray dated August 18, 2020. FINDINGS: Unchanged mild cardiomegaly. Normal pulmonary vascularity. No focal consolidation, pleural effusion, or pneumothorax. No acute osseous abnormality. Prior left shoulder reverse total arthroplasty. IMPRESSION: 1. No active disease. Electronically Signed   By: Titus Dubin M.D.   On: 10/30/2020 09:28   CT HEAD WO CONTRAST  Result Date: 11/04/2020 CLINICAL DATA:  Headache. Intracranial hemorrhage suspected. History of hip surgery. EXAM: CT HEAD WITHOUT CONTRAST TECHNIQUE: Contiguous axial images were obtained from the base of the skull through the vertex without intravenous contrast. COMPARISON:  03/18/2020 FINDINGS: Brain: Mild cerebral atrophy is unchanged. Again noted is diffuse  low-density in the periventricular and subcortical white matter. White matter changes are stable. Negative for acute hemorrhage, mass lesion, midline shift, hydrocephalus or large infarct. Vascular: No hyperdense vessel or unexpected calcification. Skull: Normal. Negative for fracture or focal lesion. Sinuses/Orbits: Small amount of fluid in the posterior right mastoid air cells. Minimal mucosal disease in the  right anterior ethmoid air cells. Other: None IMPRESSION: 1. No acute intracranial abnormality. 2. Stable white matter changes that likely represent chronic small vessel ischemic disease. 3. Minimal sinus disease. Electronically Signed   By: Markus Daft M.D.   On: 11/04/2020 09:44   CT PELVIS WO CONTRAST  Result Date: 10/30/2020 CLINICAL DATA:  Slipped on ice. EXAM: CT PELVIS WITHOUT CONTRAST TECHNIQUE: Multidetector CT imaging of the pelvis was performed following the standard protocol without intravenous contrast. COMPARISON:  None. FINDINGS: Urinary Tract:  Unremarkable Bowel: Multiple diverticula sigmoid colon without acute inflammation. Vascular/Lymphatic: No pelvic adenopathy. Reproductive:  Post hysterectomy.  Adnexa unremarkable Other: Musculoskeletal: Subcapital femoral neck fracture with mild impaction. Minimal angulation. The joint is maintained. No hematoma. There is bruising in the subcutaneous tissue over the LEFT hip. No muscular hematoma. Posterior lumbar fusion IMPRESSION: LEFT femoral neck subcapital impaction fracture. Electronically Signed   By: Suzy Bouchard M.D.   On: 10/30/2020 10:06   DG HIP OPERATIVE UNILAT W OR W/O PELVIS LEFT  Result Date: 10/31/2020 CLINICAL DATA:  Hip fixation EXAM: OPERATIVE left HIP (WITH PELVIS IF PERFORMED)  VIEWS TECHNIQUE: Fluoroscopic spot image(s) were submitted for interpretation post-operatively. COMPARISON:  None. FINDINGS: Five intraop views were submitted for review. The patient is status post ORIF cannulated pin fixation of the left femoral  neck fracture. Fluoro time 1 minutes 31 seconds IMPRESSION: Status post ORIF pin fixation of the femoral neck fracture Electronically Signed   By: Prudencio Pair M.D.   On: 10/31/2020 19:28   DG Hip Unilat With Pelvis 2-3 Views Left  Result Date: 10/30/2020 CLINICAL DATA:  Fall on ice with left hip pain EXAM: DG HIP (WITH OR WITHOUT PELVIS) 2-3V LEFT COMPARISON:  None. FINDINGS: There is cortical irregularity and slight angulation in the subcapital left femoral neck suspicious for nondisplaced subcapital left femoral neck fracture. No additional potential fracture. No left hip dislocation. No focal osseous lesions. Partially visualized bilateral posterior spinal fusion hardware in the lower lumbar spine. No significant degenerative changes in the hip joints. IMPRESSION: Cortical irregularity and slight angulation in the subcapital left femoral neck suspicious for nondisplaced subcapital left femoral neck fracture. No left hip dislocation. Consider left hip CT for further evaluation. Electronically Signed   By: Ilona Sorrel M.D.   On: 10/30/2020 09:28    Subjective: Pt c/o fatigue    Discharge Exam: Vitals:   11/12/20 0602 11/12/20 0755  BP: (!) 136/48 (!) 144/57  Pulse: 65 66  Resp: 16 16  Temp: 98.2 F (36.8 C) 99 F (37.2 C)  SpO2: 96% 96%   Vitals:   11/11/20 1956 11/12/20 0006 11/12/20 0602 11/12/20 0755  BP: (!) 160/53 137/61 (!) 136/48 (!) 144/57  Pulse: 61 (!) 58 65 66  Resp: _0 Temp: 97.7 F (36.5 C) 98.3 F (36.8 C) 98.2 F (36.8 C) 99 F (37.2 C)  TempSrc: Oral Oral  Oral  SpO2: 98% 96% 96% 96%  Weight:      Height:        General: Pt is alert, awake, not in acute distress Cardiovascular:  S1/S2 +, no rubs, no gallops Respiratory: CTA bilaterally, no wheezing, no rhonchi Abdominal: Soft, NT, ND, bowel sounds + Extremities: no cyanosis    The results of significant diagnostics from this hospitalization (including imaging, microbiology, ancillary and  laboratory) are listed below for reference.     Microbiology: Recent Results (from the past 240 hour(s))  SARS CORONAVIRUS 2 (TAT 6-24 HRS) Nasopharyngeal Nasopharyngeal  Swab     Status: None   Collection Time: 11/10/20 11:40 AM   Specimen: Nasopharyngeal Swab  Result Value Ref Range Status   SARS Coronavirus 2 NEGATIVE NEGATIVE Final    Comment: (NOTE) SARS-CoV-2 target nucleic acids are NOT DETECTED.  The SARS-CoV-2 RNA is generally detectable in upper and lower respiratory specimens during the acute phase of infection. Negative results do not preclude SARS-CoV-2 infection, do not rule out co-infections with other pathogens, and should not be used as the sole basis for treatment or other patient management decisions. Negative results must be combined with clinical observations, patient history, and epidemiological information. The expected result is Negative.  Fact Sheet for Patients: SugarRoll.be  Fact Sheet for Healthcare Providers: https://www.woods-mathews.com/  This test is not yet approved or cleared by the [place] FDA and  has been authorized for detection and/or diagnosis of SARS-CoV-2 by FDA under an Emergency Use Authorization (EUA). This EUA will remain  in effect (meaning this test can be used) for the duration of the COVID-19 declaration under Se ction 564(b)(1) of the Act, 21 U.S.C. section 360bbb-3(b)(1), unless the authorization is terminated or revoked sooner.  Performed at [facility], [place] [address], [place], [place] [address]      Labs: BNP (last 3 results) No results for input(s): BNP in the last 8760 hours. Basic Metabolic Panel: Recent Labs  Lab 11/07/20 0621 11/08/20 0450 11/09/20 0520 11/10/20 0513 11/11/20 0348  NA 136 142 139 136 134*  K 4.5 3.9 3.9 4.5 5.0  CL 101 106 103 100 101  CO2 _0 GLUCOSE 88 85 96 89 102*  BUN 29* _1 25*  CREATININE 0.90 0.85 0.86  1.04* 0.97  CALCIUM 9.1 9.2 8.8* 8.9 9.1   Liver Function Tests: No results for input(s): AST, ALT, ALKPHOS, BILITOT, PROT, ALBUMIN in the last 168 hours. No results for input(s): LIPASE, AMYLASE in the last 168 hours. No results for input(s): AMMONIA in the last 168 hours. CBC: Recent Labs  Lab 11/07/20 0856 11/08/20 0450 11/09/20 0520 11/10/20 0513 11/11/20 0348  WBC 15.3* 15.3* 18.4* 19.0* 16.6*  HGB 9.1* 9.1* 8.9* 8.9* 8.5*  HCT 28.2* 27.1* 27.7* 28.1* 27.8*  MCV 87.9 83.9 88.8 89.5 90.6  PLT 297 293 324 336 426*   Cardiac Enzymes: No results for input(s): CKTOTAL, CKMB, CKMBINDEX, TROPONINI in the last 168 hours. BNP: Invalid input(s): POCBNP CBG: Recent Labs  Lab 11/09/20 2114  GLUCAP 106*   D-Dimer No results for input(s): DDIMER in the last 72 hours. Hgb A1c No results for input(s): HGBA1C in the last 72 hours. Lipid Profile No results for input(s): CHOL, HDL, LDLCALC, TRIG, CHOLHDL, LDLDIRECT in the last 72 hours. Thyroid function studies No results for input(s): TSH, T4TOTAL, T3FREE, THYROIDAB in the last 72 hours.  Invalid input(s): FREET3 Anemia work up No results for input(s): VITAMINB12, FOLATE, FERRITIN, TIBC, IRON, RETICCTPCT in the last 72 hours. Urinalysis    Component Value Date/Time   COLORURINE YELLOW (A) 06/19/2020 1150   APPEARANCEUR HAZY (A) 06/19/2020 1150   APPEARANCEUR Clear 09/03/2014 1200   LABSPEC 1.017 06/19/2020 1150   LABSPEC 1.018 09/03/2014 1200   PHURINE 8.0 06/19/2020 1150   GLUCOSEU NEGATIVE 06/19/2020 1150   GLUCOSEU Negative 09/03/2014 1200   HGBUR NEGATIVE 06/19/2020 1150   BILIRUBINUR NEGATIVE 06/19/2020 1150   BILIRUBINUR Negative 09/03/2014 1200   KETONESUR NEGATIVE 06/19/2020 1150   PROTEINUR NEGATIVE 06/19/2020 1150   NITRITE NEGATIVE 06/19/2020  [place] 06/19/2020 1150   LEUKOCYTESUR Negative 09/03/2014 1200   Sepsis Labs Invalid input(s): PROCALCITONIN,  WBC,   LACTICIDVEN Microbiology Recent Results (from the past 240 hour(s))  SARS CORONAVIRUS 2 (TAT 6-24 HRS) Nasopharyngeal Nasopharyngeal Swab     Status: None   Collection Time: 11/10/20 11:40 AM   Specimen: Nasopharyngeal Swab  Result Value Ref Range Status   SARS Coronavirus 2 NEGATIVE NEGATIVE Final    Comment: (NOTE) SARS-CoV-2 target nucleic acids are NOT DETECTED.  The SARS-CoV-2 RNA is generally detectable in upper and lower respiratory specimens during the acute phase of infection. Negative results do not preclude SARS-CoV-2 infection, do not rule out co-infections with other pathogens, and should not be used as the sole basis for treatment or other patient management decisions. Negative results must be combined with clinical observations, patient history, and epidemiological information. The expected result is Negative.  Fact Sheet for Patients: SugarRoll.be  Fact Sheet for Healthcare Providers: https://www.woods-mathews.com/  This test is not yet approved or cleared by the [place] FDA and  has been authorized for detection and/or diagnosis of SARS-CoV-2 by FDA under an Emergency Use Authorization (EUA). This EUA will remain  in effect (meaning this test can be used) for the duration of the COVID-19 declaration under Se ction 564(b)(1) of the Act, 21 U.S.C. section 360bbb-3(b)(1), unless the authorization is terminated or revoked sooner.  Performed at [facility], [place] [address], [place], [place] [address]      Time coordinating discharge: Over 30 minutes  SIGNED:   Wyvonnia Dusky, MD  [facility] 11/12/2020, 10:17 AM Pager   If 7PM-7AM, please contact night-coverage

## 2020-11-11 NOTE — Progress Notes (Signed)
Physical Therapy Treatment Patient Details Name: Ariana [name] MRN: [id number] DOB: Sep 07, 1933 Today's Date: 11/11/2020    History of Present Illness Ariana [name] is a 87 y.o. female with medical history significant for ITP, Alzheimer's dementia, depression, hypothyroidism, anxiety disorder and asthma who arrives to the ER via EMS for evaluation of left hip pain following a fall.  Patient was walking her dog and slipped on ice, landing on her left side. S/p L ORIF on 10/31/20.    PT Comments     Pt in bed, awaiting breakfast.  Called and try is in route.    She is encouraged to get up to eat.  Denied needing to use bathroom but encouraged to walk to toilet to try.  To EOB with min guard.  Upon standing she is inc of urine and quickly sits back on bed, frustrated by incontinence.  BSC is brought and she transfers with poor safety to void bedside.  She is able to walk 10' in room but self limits due to pain.  Continues with unsafe antalgic gait pattern despite cues.  Returns to recliner to rest then stands for LLE AROM 2 x 10 at chairside.  Unable to progress gait further at this time.   Follow Up Recommendations  SNF     Equipment Recommendations  None recommended by PT    Recommendations for Other Services       Precautions / Restrictions Precautions Precautions: Fall Restrictions Weight Bearing Restrictions: Yes LLE Weight Bearing: Weight bearing as tolerated    Mobility  Bed Mobility Overal bed mobility: Needs Assistance Bed Mobility: Supine to Sit Rolling: Min guard            Transfers Overall transfer level: Needs assistance Equipment used: Rolling walker (2 wheeled) Transfers: Sit to/from Stand Sit to Stand: Min assist            Ambulation/Gait Ambulation/Gait assistance: Min Web designer (Feet): 10 Feet Assistive device: Rolling walker (2 wheeled) Gait Pattern/deviations: Step-to pattern;Antalgic Gait velocity:  decreased with poor safety and quality,self  limiting WB on LLE   General Gait Details: 10' in room with poor gait quality and poor safety.   Stairs             Wheelchair Mobility    Modified Rankin (Stroke Patients Only)       Balance Overall balance assessment: Needs assistance Sitting-balance support: No upper extremity supported;Feet supported Sitting balance-Leahy Scale: Good     Standing balance support: Bilateral upper extremity supported;During functional activity Standing balance-Leahy Scale: Fair Standing balance comment: several times in standing impulsively lets go to RW however does not have LOB. Dynamic balance requires BUE support                            Cognition Arousal/Alertness: Awake/alert Behavior During Therapy: WFL for tasks assessed/performed Overall Cognitive Status: History of cognitive impairments - at baseline                                        Exercises Other Exercises Other Exercises: standing LE AROM 2 x 10 Other Exercises: commode to void    General Comments        Pertinent Vitals/Pain Pain Assessment: Faces Faces Pain Scale: Hurts whole lot Pain Location: L hip Pain Descriptors / Indicators: Sore;Grimacing;Guarding Pain Intervention(s): Limited activity within patient's  tolerance;Monitored during session;Repositioned    Home Living                      Prior Function            PT Goals (current goals can now be found in the care plan section) Progress towards PT goals: Progressing toward goals    Frequency    7X/week      PT Plan Current plan remains appropriate    Co-evaluation              AM-PAC PT "6 Clicks" Mobility   Outcome Measure  Help needed turning from your back to your side while in a flat bed without using bedrails?: A Little Help needed moving from lying on your back to sitting on the side of a flat bed without using bedrails?: A  Little Help needed moving to and from a bed to a chair (including a wheelchair)?: A Little Help needed standing up from a chair using your arms (e.g., wheelchair or bedside chair)?: A Little Help needed to walk in hospital room?: A Little Help needed climbing 3-5 steps with a railing? : A Lot 6 Click Score: 17    End of Session Equipment Utilized During Treatment: Gait belt Activity Tolerance: Patient limited by pain Patient left: in chair;with call bell/phone within reach;with chair alarm set Nurse Communication: Mobility status;Other (comment) Pain - Right/Left: Left Pain - part of body: Hip     Time: 2831-5176 PT Time Calculation (min) (ACUTE ONLY): 15 min  Charges:  $Gait Training: 8-22 mins                    Chesley Noon, PTA 11/11/20, 9:43 AM

## 2020-11-11 NOTE — Progress Notes (Signed)
Occupational Therapy Treatment Patient Details Name: Ariana [name] MRN: [id number] DOB: 12-25-1932 Today's Date: 11/11/2020    History of present illness Ariana [name] is a 87 y.o. female with medical history significant for ITP, Alzheimer's dementia, depression, hypothyroidism, anxiety disorder and asthma who arrives to the ER via EMS for evaluation of left hip pain following a fall.  Patient was walking her dog and slipped on ice, landing on her left side. S/p L ORIF on 10/31/20.   OT comments  Ariana [name] was seen for OT treatment on this date. Upon arrival to room pt reclined in bed with wet pad, attempted to exit bed for cleaning and pt incontinent in standing. MIN A + HHA for BSC t/f. MIN A + HHA perihygiene at [facility] in standing. SETUP [employer] SUPERVISION seated grooming (tooth brushing, hair brushing, deodorant). MIN A bathing seated and standing at [facility]. Pt making good progress toward goals. Pt continues to benefit from skilled OT services to maximize return to PLOF and minimize risk of future falls, injury, caregiver burden, and readmission. Will continue to follow POC. Discharge recommendation remains appropriate.    Follow Up Recommendations  SNF    Equipment Recommendations  3 in 1 bedside commode;Tub/shower bench    Recommendations for Other Services      Precautions / Restrictions Precautions Precautions: Fall Restrictions Weight Bearing Restrictions: Yes LLE Weight Bearing: Weight bearing as tolerated       Mobility Bed Mobility Overal bed mobility: Needs Assistance Bed Mobility: Supine to Sit;Sit to Supine Rolling: Min assist   Supine to sit: Min assist        Transfers Overall transfer level: Needs assistance Equipment used: Rolling walker (2 wheeled) Transfers: Sit to/from [facility] Sit to Stand: Min assist Stand pivot transfers: Min assist            Balance Overall balance assessment: Needs  assistance Sitting-balance support: No upper extremity supported;Feet supported Sitting balance-Leahy Scale: Good     Standing balance support: No upper extremity supported;During functional activity Standing balance-Leahy Scale: Poor                             ADL either performed or assessed with clinical judgement   ADL Overall ADL's : Needs assistance/impaired                                       General ADL Comments: SETUP [employer] SUPERVISION seated grooming (tooth brushing, hair brushing, deodorant). MIN A bathing seated and standing at [facility]. MIN A + HHA perihygiene at [facility] in standing. MIN A + HHA for BSC t/f               Cognition Arousal/Alertness: Awake/alert Behavior During Therapy: WFL for tasks assessed/performed Overall Cognitive Status: History of cognitive impairments - at baseline                                          Exercises Exercises: General Upper Extremity;General Lower Extremity;Other exercises General Exercises - Upper Extremity Chair Push Up: Strengthening;Both;10 reps;Seated General Exercises - Lower Extremity Long Arc Quad: Strengthening;Both;10 reps;Seated Hip Flexion/Marching: Strengthening;Both;10 reps;Seated Other Exercises Other Exercises: Pt educated re: OT role, DME recs, falls prevention Other Exercises: LBD, bathing, grooming, toileting, sup<>sit, sit<>stand,  sitting/standing balance/tolerance, SPT           Pertinent Vitals/ Pain       Pain Assessment: Faces Faces Pain Scale: Hurts little more Pain Location: L hip Pain Descriptors / Indicators: Sore;Grimacing;Guarding Pain Intervention(s): Limited activity within patient's tolerance;Repositioned         Frequency  Min 2X/week        Progress Toward Goals  OT Goals(current goals can now be found in the care plan section)  Progress towards OT goals: Progressing toward goals  Acute Rehab OT Goals Patient Stated Goal: To  return home safely to see my dogs OT Goal Formulation: With patient Time For Goal Achievement: 11/16/20 Potential to Achieve Goals: Good ADL Goals Pt Will Perform Grooming: with modified independence;standing Pt Will Perform Lower Body Dressing: with modified independence;sit to/from stand Pt Will Transfer to Toilet: with modified independence;ambulating;regular height toilet  Plan Discharge plan remains appropriate;Frequency remains appropriate       AM-PAC OT "6 Clicks" Daily Activity     Outcome Measure   Help from another person eating meals?: None Help from another person taking care of personal grooming?: A Little Help from another person toileting, which includes using toliet, bedpan, or urinal?: A Little Help from another person bathing (including washing, rinsing, drying)?: A Little Help from another person to put on and taking off regular upper body clothing?: A Little Help from another person to put on and taking off regular lower body clothing?: A Lot 6 Click Score: 18    End of Session    OT Visit Diagnosis: Other abnormalities of gait and mobility (R26.89);Muscle weakness (generalized) (M62.81)   Activity Tolerance Patient tolerated treatment well   Patient Left in bed;with call bell/phone within reach;with bed alarm set   Nurse Communication          Time: [phone] OT Time Calculation (min): 38 min  Charges: OT General Charges $OT Visit: 1 Visit OT Treatments $Self Care/Home Management : 23-37 mins $Therapeutic Exercise: 8-22 mins  Dessie Coma, M.S. OTR/L  11/11/20, 4:23 PM  ascom [phone]

## 2020-11-12 MED ORDER — AMLODIPINE BESYLATE 5 MG PO TABS: 5.0000 mg | ORAL_TABLET | Freq: Every day | ORAL | 0 refills | Status: DC

## 2020-11-12 NOTE — TOC Progression Note (Signed)
Transition of Care [facility]) - Progression Note    Patient Details  Name: Ariana [name] MRN: [id number] Date of Birth: [birth year]  Transition of Care [facility]) CM/SW [place], RN Phone Number: 11/12/2020, 10:19 AM  Clinical Narrative: 8:30am- RNC reached out to [facility] with [facility], she is ready for patient today.  9:15am-  RNCM received communication from MD that patient and husband have decided she will go home. They want to accept the home health services that were originally planned. RNCM notified Magda Paganini with [facility] as well as [place] with [employer].     Expected Discharge Plan: [place] Barriers to Discharge: Continued Medical Work up  Expected Discharge Plan and Services Expected Discharge Plan: [place] In-house Referral: Clinical Social Work     Living arrangements for the past 2 months: Single Family Home Expected Discharge Date: 11/12/20                                     Social Determinants of Health (SDOH) Interventions    Readmission Risk Interventions No flowsheet data found.

## 2020-11-12 NOTE — Progress Notes (Signed)
Patient is stable and ready for discharge home with H/H services. Patient worked with PT this morning walking in hallway without issues just still with generalized weakness. Patient's IV removed. Writer went over discharge paperwork with husband and he verbalized understanding and had no further questions. Patient is being transported via [facility] to husbands car. Patient's husband packed patient's belongings and took to the car.

## 2020-11-12 NOTE — Progress Notes (Signed)
Physical Therapy Treatment Patient Details Name: Ariana [name] MRN: [id number] DOB: May 09, 1933 Today's Date: 11/12/2020    History of Present Illness Ariana [name] is a 87 y.o. female with medical history significant for ITP, Alzheimer's dementia, depression, hypothyroidism, anxiety disorder and asthma who arrives to the ER via EMS for evaluation of left hip pain following a fall.  Patient was walking her dog and slipped on ice, landing on her left side. S/p L ORIF on 10/31/20.    PT Comments    Pt was supine in bed upon arriving. DC orders in chart. Seen per pt's spouse request. Reissued HEP handout and issued gait belt. Acute PT has been recommending DC to SNF however both pt and pt's spouse feel safe with DC to home. Recommend HHPT to continue to follow and progress pt per POC. Pt was able to ambulate 30 ft with only CGA. Spouse present throughout and understanding risk and concerns with DC home. Pt was back in bed at conclusion of session with call bell in reach and RN aware pt is ready for DC.    Follow Up Recommendations  Supervision/Assistance - 24 hour;Home health PT;No PT follow up     Equipment Recommendations  None recommended by PT    Recommendations for Other Services       Precautions / Restrictions Precautions Precautions: Fall Restrictions Weight Bearing Restrictions: Yes LLE Weight Bearing: Weight bearing as tolerated    Mobility  Bed Mobility Overal bed mobility: Needs Assistance Bed Mobility: Supine to Sit;Sit to Supine Rolling: Min assist   Supine to sit: Min assist Sit to supine: Supervision   General bed mobility comments: Min assist to exit bed and supervision after OOB activity to return to supine  Transfers Overall transfer level: Needs assistance Equipment used: Rolling walker (2 wheeled) Transfers: Sit to/from Stand Sit to Stand: Min guard         General transfer comment: CGA for  safety  Ambulation/Gait Ambulation/Gait assistance: Min guard Gait Distance (Feet): 30 Feet Assistive device: Rolling walker (2 wheeled) Gait Pattern/deviations: Step-to pattern;Antalgic Gait velocity: decreased   General Gait Details: pt ambulated to doorway of room and return       Balance Overall balance assessment: Needs assistance Sitting-balance support: No upper extremity supported;Feet supported Sitting balance-Leahy Scale: Good     Standing balance support: Bilateral upper extremity supported;During functional activity Standing balance-Leahy Scale: Fair Standing balance comment: reliant on BUE support during all standing       Cognition Arousal/Alertness: Awake/alert Behavior During Therapy: WFL for tasks assessed/performed Overall Cognitive Status: History of cognitive impairments - at baseline    General Comments: Pt's spouse in room. Both pt/pt's spoouse feel confident in safe DC home today.             Pertinent Vitals/Pain Pain Assessment: 0-10 Pain Score: 5  Faces Pain Scale: Hurts little more Pain Location: L hip Pain Descriptors / Indicators: Sore;Grimacing;Guarding Pain Intervention(s): Limited activity within patient's tolerance;Monitored during session;Premedicated before session;Repositioned           PT Goals (current goals can now be found in the care plan section) Acute Rehab PT Goals Patient Stated Goal: To return home safely to see my dogs Progress towards PT goals: Progressing toward goals    Frequency    7X/week      PT Plan Current plan remains appropriate       AM-PAC PT "6 Clicks" Mobility   Outcome Measure  Help needed turning from your back to  your side while in a flat bed without using bedrails?: A Little Help needed moving from lying on your back to sitting on the side of a flat bed without using bedrails?: A Little Help needed moving to and from a bed to a chair (including a wheelchair)?: A Little Help needed  standing up from a chair using your arms (e.g., wheelchair or bedside chair)?: A Little Help needed to walk in hospital room?: A Little Help needed climbing 3-5 steps with a railing? : A Little 6 Click Score: 18    End of Session Equipment Utilized During Treatment: Gait belt Activity Tolerance: Patient tolerated treatment well;Patient limited by pain Patient left: in bed;with call bell/phone within reach;with nursing/sitter in room;with family/visitor present Nurse Communication: Mobility status PT Visit Diagnosis: Muscle weakness (generalized) (M62.81);Difficulty in walking, not elsewhere classified (R26.2);Pain Pain - Right/Left: Left Pain - part of body: Hip     Time: 6967-8938 PT Time Calculation (min) (ACUTE ONLY): 20 min  Charges:  $Gait Training: 8-22 mins                     Julaine Fusi PTA 11/12/20, 10:55 AM

## 2020-11-18 ENCOUNTER — Telehealth: Payer: Self-pay | Admitting: Adult Health Nurse Practitioner

## 2020-11-18 NOTE — Telephone Encounter (Signed)
Spoke with patient's husband Kyung Rudd, regarding Palliative referral/services and all questions were answered and he was in agreement with scheduling visit with NP.  I have scheduled an In-home Consult for 11/21/20 @ 10 AM.

## 2020-11-20 DIAGNOSIS — F419 Anxiety disorder, unspecified: Secondary | ICD-10-CM | POA: Diagnosis not present

## 2020-11-20 DIAGNOSIS — F0280 Dementia in other diseases classified elsewhere without behavioral disturbance: Secondary | ICD-10-CM | POA: Diagnosis not present

## 2020-11-20 DIAGNOSIS — K449 Diaphragmatic hernia without obstruction or gangrene: Secondary | ICD-10-CM | POA: Diagnosis not present

## 2020-11-20 DIAGNOSIS — D649 Anemia, unspecified: Secondary | ICD-10-CM | POA: Diagnosis not present

## 2020-11-20 DIAGNOSIS — S72041D Displaced fracture of base of neck of right femur, subsequent encounter for closed fracture with routine healing: Secondary | ICD-10-CM | POA: Diagnosis not present

## 2020-11-20 DIAGNOSIS — F32A Depression, unspecified: Secondary | ICD-10-CM | POA: Diagnosis not present

## 2020-11-20 DIAGNOSIS — I10 Essential (primary) hypertension: Secondary | ICD-10-CM | POA: Diagnosis not present

## 2020-11-20 DIAGNOSIS — G309 Alzheimer's disease, unspecified: Secondary | ICD-10-CM | POA: Diagnosis not present

## 2020-11-20 DIAGNOSIS — J45909 Unspecified asthma, uncomplicated: Secondary | ICD-10-CM | POA: Diagnosis not present

## 2020-11-21 ENCOUNTER — Other Ambulatory Visit: Payer: Self-pay | Admitting: Adult Health Nurse Practitioner

## 2020-11-21 ENCOUNTER — Other Ambulatory Visit: Payer: Self-pay

## 2020-11-21 DIAGNOSIS — D473 Essential (hemorrhagic) thrombocythemia: Secondary | ICD-10-CM

## 2020-11-21 DIAGNOSIS — R519 Headache, unspecified: Secondary | ICD-10-CM | POA: Diagnosis not present

## 2020-11-21 DIAGNOSIS — G301 Alzheimer's disease with late onset: Secondary | ICD-10-CM | POA: Diagnosis not present

## 2020-11-21 DIAGNOSIS — Z515 Encounter for palliative care: Secondary | ICD-10-CM

## 2020-11-21 DIAGNOSIS — F0280 Dementia in other diseases classified elsewhere without behavioral disturbance: Secondary | ICD-10-CM | POA: Diagnosis not present

## 2020-11-21 DIAGNOSIS — S72002A Fracture of unspecified part of neck of left femur, initial encounter for closed fracture: Secondary | ICD-10-CM | POA: Diagnosis not present

## 2020-11-21 NOTE — Progress Notes (Signed)
[facility] Consult Note Telephone: [phone]  Fax: [phone]  PATIENT NAME: Ariana [name] DOB: [birth year] MRN: [id number]  PRIMARY CARE PROVIDER:   Idelle Crouch, MD  REFERRING PROVIDER:  Idelle Crouch, MD [facility] [place],  [place] [address]  RESPONSIBLE PARTY:   Laney Louderback, husband [phone]  Chief complaint:  Initial palliative visit/debility       RECOMMENDATIONS and PLAN:  1.  Advanced care planning.  Patient is DNR.  She has copy of DNR in the home.  Filled out MOST form today with DNR, limited hospital interventions, antibiotics as indicated.  IV fluids for a trial period, and no feeding tube.  Uploaded DNR and MOST to [employer] and left originals in the home.  Spent at least 20 minutes going over ACP.  2.  Left femoral fracture.  Patient had surgical repair. She will be going through PT through [facility] home health.  Work with PT as ordered.  3.  Dementia. Patient is being followed by neurology.  She is currently on aricept and namenda.  Continue follow up and recommendations by neurology  4.  ITP.  Patient being followed by oncology and is doing well with Nplane injections.  Continue follow up and recommendations by oncology  5.  Headaches.  These are new onset headaches.   Due to her dementia she is unable to fully describe her headaches.  Will reach out to neurology to evaluate for etiology of headaches.    Palliative will continue to monitor for symptom management/decline and make recommendations as needed.  Next visit is in 8 weeks.  Encouraged to call with any questions.   HISTORY OF PRESENT ILLNESS:  Ariana [name] is a 87 y.o. year old female with multiple medical problems including Alzheimer's dementia, ITP, hypothyroidism, depression, HTN, GERD.  Family history includes mother having CVA. Patient lives at home with husband.  Palliative Care was  asked to help address goals of care. Reviewed medical record, latest labs and imaging.  1/19-11/12/2020 she was in hospital for left femoral neck fracture post fall.  Patient is now home and will be starting PT through home health.  Husband states that SW came last week and is going to help with resources to get help in the home to stay with her so he can go out and take care of errands.  Husband states that prior to fall she was able to walk with his assistance.  She is not walking much right now after the fall.  She is able to transfer from bed to bedside commode.  She required minimal assistance with bathing and dressing prior to fall and now is requiring more assistance.  She wears briefs due to functional incontinence. She is able to feed herself.  Her appetite is decreased and husband states that she drinks mostly Ensures for nutrition.  States that when he asks what she would like to eat she won't make a decision.  Her weight was 133 pounds and BMI 23.56 in August 2021.  Her current weight is 148 pounds with BMI 27.06.  Patient has pain to left hip related to fracture and gets relief with PRN percocet.  She also has chronic knee pain related to osteoarthritis.  States that since she has been in the hospital she started having headaches.  She does not get the headaches everyday but states that she gets them most days.  She is unable to describe the headache  pain.  Patient unable to answer all HPI/ROS questions reliably due to dementia.  Husband contributed HPI/ROS.   CODE STATUS: DNR  PPS: 40% HOSPICE ELIGIBILITY/DIAGNOSIS: TBD  PHYSICAL EXAM:  BP 112/78 HR 58 O2 99% on RA General: NAD, frail appearing, thin Eyes: sclera anicteric and noninjected with no discharge noted Cardiovascular: regular rate and rhythm Pulmonary: clear ant fields; normal respiratory effort Abdomen: soft, nontender, + bowel sounds Extremities: no edema, no joint deformities Skin: no rashes on exposed skin Neurological:  Weakness;  A&O to person and place   PAST MEDICAL HISTORY:  Past Medical History:  Diagnosis Date  . Alzheimer disease ([place])   . Anemia   . Anxiety   . Asthma    WELL CONTROLLED  . Chronic back pain   . Depression   . Depression   . GERD (gastroesophageal reflux disease)   . Hiatal hernia   . Hypercholesteremia   . Hypothyroidism   . ITP (idiopathic thrombocytopenic purpura)    FOLLOWED BR DR Grayland Ormond  . LBBB (left bundle branch block)   . Osteoarthritis   . Osteoporosis   . Restless legs     SOCIAL HX:  Social History   Tobacco Use  . Smoking status: Never Smoker  . Smokeless tobacco: Never Used  Substance Use Topics  . Alcohol use: No    Alcohol/week: 0.0 standard drinks    ALLERGIES:  Allergies  Allergen Reactions  . Aspirin     Upset stomach  . Diazepam Itching    sneezing  . Other     seasonal allergies  . Tetanus Toxoids     Localized superficial swelling of skin  . Morphine Itching and Rash     PERTINENT MEDICATIONS:  Outpatient Encounter Medications as of 11/21/2020  Medication Sig  . acetaminophen (TYLENOL) 325 MG tablet Take 2 tablets (650 mg total) by mouth every 6 (six) hours as needed for mild pain (or Fever >/= 101).  Marland Kitchen albuterol (PROVENTIL HFA;VENTOLIN HFA) 108 (90 BASE) MCG/ACT inhaler Inhale 2 puffs into the lungs every 6 (six) hours as needed for wheezing or shortness of breath.  . ALPRAZolam (XANAX) 0.5 MG tablet Take 1 tablet (0.5 mg total) by mouth 3 (three) times daily as needed for anxiety.  Marland Kitchen amLODipine (NORVASC) 5 MG tablet Take 1 tablet (5 mg total) by mouth daily.  . benzonatate (TESSALON) 200 MG capsule Take 200 mg by mouth 3 (three) times daily.  Marland Kitchen buPROPion (WELLBUTRIN XL) 150 MG 24 hr tablet Take 150 mg by mouth every morning.   . busPIRone (BUSPAR) 15 MG tablet Take 15 mg by mouth 2 (two) times daily.   . Calcium Carbonate-Vit D-Min (CALCIUM 600+D PLUS MINERALS) 600-400 MG-UNIT TABS Take 1 tablet by mouth daily.   .  Carboxymethylcellul-Glycerin (LUBRICATING EYE DROPS OP) Place 1 drop into both eyes daily as needed (dry eyes).  . Cholecalciferol (VITAMIN D) 50 MCG (2000 UT) tablet Take 2,000 Units by mouth daily.   . citalopram (CELEXA) 40 MG tablet Take 40 mg by mouth every morning.   . Cyanocobalamin 1000 MCG TBCR Take 1,000 mcg by mouth daily.   Marland Kitchen donepezil (ARICEPT) 10 MG tablet Take 10 mg by mouth 2 (two) times daily.  . feeding supplement, ENSURE ENLIVE, (ENSURE ENLIVE) LIQD Take 237 mLs by mouth 2 (two) times daily between meals. (Patient taking differently: Take 237 mLs by mouth daily with breakfast.)  . fluticasone (FLONASE) 50 MCG/ACT nasal spray Place 2 sprays into both nostrils daily as needed for  allergies.   . Fluticasone-Salmeterol (ADVAIR) 250-50 MCG/DOSE AEPB Inhale 1 puff into the lungs 2 (two) times daily as needed (shortness of breath).   . gabapentin (NEURONTIN) 300 MG capsule Take 300 mg by mouth 4 (four) times daily.  Marland Kitchen lamoTRIgine (LAMICTAL) 25 MG tablet Take 50 mg by mouth at bedtime.  Marland Kitchen levothyroxine (SYNTHROID, LEVOTHROID) 50 MCG tablet Take 50 mcg by mouth daily before breakfast. Take 30 to 60 minutes before breakfast.  . memantine (NAMENDA) 10 MG tablet Take 10 mg by mouth 2 (two) times daily.   . montelukast (SINGULAIR) 10 MG tablet Take 10 mg by mouth at bedtime.   . Multiple Vitamin (MULTI-VITAMIN) tablet Take 1 tablet by mouth daily.   . Multiple Vitamins-Minerals (PRESERVISION AREDS 2) CAPS Take 1 capsule by mouth 2 (two) times a day.  Marland Kitchen NARCAN 4 MG/0.1ML LIQD nasal spray kit Place 1 spray into the nose once.   Marland Kitchen oxybutynin (DITROPAN) 5 MG tablet Take 5 mg by mouth 3 (three) times daily.  Marland Kitchen oxyCODONE-acetaminophen (PERCOCET) 10-325 MG tablet Take 1 tablet by mouth every 6 (six) hours as needed for moderate pain or severe pain.  . pantoprazole (PROTONIX) 20 MG tablet Take 20 mg by mouth 2 (two) times daily.   . pravastatin (PRAVACHOL) 20 MG tablet Take 20 mg by mouth at  bedtime.   . traMADol (ULTRAM) 50 MG tablet Take 1 tablet (50 mg total) by mouth every 6 (six) hours as needed for moderate pain.  . vitamin C (ASCORBIC ACID) 500 MG tablet Take 500 mg by mouth daily.  . vitamin E 180 MG (400 UNITS) capsule Take 400 Units by mouth daily.   No facility-administered encounter medications on file as of 11/21/2020.     Sion Thane Jenetta Downer, NP

## 2020-11-22 DIAGNOSIS — K449 Diaphragmatic hernia without obstruction or gangrene: Secondary | ICD-10-CM | POA: Diagnosis not present

## 2020-11-22 DIAGNOSIS — I10 Essential (primary) hypertension: Secondary | ICD-10-CM | POA: Diagnosis not present

## 2020-11-22 DIAGNOSIS — F419 Anxiety disorder, unspecified: Secondary | ICD-10-CM | POA: Diagnosis not present

## 2020-11-22 DIAGNOSIS — F0280 Dementia in other diseases classified elsewhere without behavioral disturbance: Secondary | ICD-10-CM | POA: Diagnosis not present

## 2020-11-22 DIAGNOSIS — S72041D Displaced fracture of base of neck of right femur, subsequent encounter for closed fracture with routine healing: Secondary | ICD-10-CM | POA: Diagnosis not present

## 2020-11-22 DIAGNOSIS — J45909 Unspecified asthma, uncomplicated: Secondary | ICD-10-CM | POA: Diagnosis not present

## 2020-11-22 DIAGNOSIS — G309 Alzheimer's disease, unspecified: Secondary | ICD-10-CM | POA: Diagnosis not present

## 2020-11-22 DIAGNOSIS — F32A Depression, unspecified: Secondary | ICD-10-CM | POA: Diagnosis not present

## 2020-11-22 DIAGNOSIS — D649 Anemia, unspecified: Secondary | ICD-10-CM | POA: Diagnosis not present

## 2020-11-22 NOTE — Progress Notes (Deleted)
Ariana [name]  [phone]  [phone]     Ariana [name] DOB: 1933-03-17  MR#: [id number]  PYK#:[id number]  Patient Care Team: Idelle Crouch, MD as PCP - General (Internal Medicine) Lloyd Huger, MD as Consulting Physician (Oncology)   CHIEF COMPLAINT: Chronic refractory ITP  INTERVAL HISTORY: Patient returns to clinic today for repeat laboratory work, further evaluation, and continuation of Nplate.  She has noticed increased bruising recently, but otherwise has felt well.  She has no further falls. She continues to have chronic weakness and fatigue.  She continues to have a poor memory. She has no neurologic complaints.  She denies any recent fevers or illnesses.  She denies any chest pain, shortness of breath, cough, or hemoptysis.  She denies any nausea, vomiting, constipation, or diarrhea.  She has no melena or hematochezia.  She has no urinary complaints.  Patient offers no further specific complaints today.  REVIEW OF SYSTEMS:   Review of Systems  Constitutional: Positive for malaise/fatigue. Negative for fever and weight loss.  HENT: Negative for congestion.   Respiratory: Negative.  Negative for cough, hemoptysis and shortness of breath.   Cardiovascular: Negative.  Negative for chest pain and leg swelling.  Gastrointestinal: Negative.  Negative for abdominal pain, blood in stool, diarrhea, melena and nausea.  Genitourinary: Negative.  Negative for dysuria and hematuria.  Musculoskeletal: Negative for back pain, falls and joint pain.  Skin: Negative.  Negative for rash.  Neurological: Positive for weakness. Negative for dizziness, sensory change, focal weakness and headaches.  Endo/Heme/Allergies: Bruises/bleeds easily.  Psychiatric/Behavioral: Positive for memory loss. Negative for depression. The patient is not nervous/anxious.     As per HPI. Otherwise, a complete review of systems is negative.  PAST MEDICAL  HISTORY: Past Medical History:  Diagnosis Date  . Alzheimer disease ([place])   . Anemia   . Anxiety   . Asthma    WELL CONTROLLED  . Chronic back pain   . Depression   . Depression   . GERD (gastroesophageal reflux disease)   . Hiatal hernia   . Hypercholesteremia   . Hypothyroidism   . ITP (idiopathic thrombocytopenic purpura)    FOLLOWED BR DR Grayland Ormond  . LBBB (left bundle branch block)   . Osteoarthritis   . Osteoporosis   . Restless legs     PAST SURGICAL HISTORY: Past Surgical History:  Procedure Laterality Date  . CARPAL TUNNEL RELEASE    . ESOPHAGOGASTRODUODENOSCOPY (EGD) WITH PROPOFOL N/A 09/10/2015   Procedure: ESOPHAGOGASTRODUODENOSCOPY (EGD) WITH PROPOFOL;  Surgeon: Lollie Sails, MD;  Location: [facility] ENDOSCOPY;  Service: Endoscopy;  Laterality: N/A;  . HIP PINNING,CANNULATED Left 10/31/2020   Procedure: CANNULATED HIP PINNING;  Surgeon: Leim Fabry, MD;  Location: [facility] ORS;  Service: Orthopedics;  Laterality: Left;  . IRRIGATION AND DEBRIDEMENT HEMATOMA Right 07/13/2018   Procedure: IRRIGATION AND DEBRIDEMENT HEMATOMA-RIGHT SHIN;  Surgeon: Herbert Pun, MD;  Location: [facility] ORS;  Service: General;  Laterality: Right;  . KYPHOPLASTY N/A 05/02/2019   Procedure: L1 KYPHOPLASTY;  Surgeon: Hessie Knows, MD;  Location: [facility] ORS;  Service: Orthopedics;  Laterality: N/A;  . LUMBAR [place]    . REVERSE SHOULDER ARTHROPLASTY Left 06/25/2020   Procedure: REVERSE SHOULDER ARTHROPLASTY;  Surgeon: Corky Mull, MD;  Location: [facility] ORS;  Service: Orthopedics;  Laterality: Left;  . SPLENECTOMY, PARTIAL    . TOTAL ABDOMINAL HYSTERECTOMY      FAMILY HISTORY Family History  Problem Relation Age of Onset  . Stroke Mother  GYNECOLOGIC HISTORY:  No LMP recorded. Patient has had a hysterectomy.     ADVANCED DIRECTIVES:    HEALTH MAINTENANCE: Social History   Tobacco Use  . Smoking status: Never Smoker  . Smokeless tobacco: Never Used  Vaping Use  .  Vaping Use: Never used  Substance Use Topics  . Alcohol use: No    Alcohol/week: 0.0 standard drinks  . Drug use: No     Allergies  Allergen Reactions  . Aspirin     Upset stomach  . Diazepam Itching    sneezing  . Other     seasonal allergies  . Tetanus Toxoids     Localized superficial swelling of skin  . Morphine Itching and Rash    Current Outpatient Medications  Medication Sig Dispense Refill  . acetaminophen (TYLENOL) 325 MG tablet Take 2 tablets (650 mg total) by mouth every 6 (six) hours as needed for mild pain (or Fever >/= 101). 100 tablet 0  . albuterol (PROVENTIL HFA;VENTOLIN HFA) 108 (90 BASE) MCG/ACT inhaler Inhale 2 puffs into the lungs every 6 (six) hours as needed for wheezing or shortness of breath.    . ALPRAZolam (XANAX) 0.5 MG tablet Take 1 tablet (0.5 mg total) by mouth 3 (three) times daily as needed for anxiety. 20 tablet 0  . amLODipine (NORVASC) 5 MG tablet Take 1 tablet (5 mg total) by mouth daily. 30 tablet 0  . benzonatate (TESSALON) 200 MG capsule Take 200 mg by mouth 3 (three) times daily.    Marland Kitchen buPROPion (WELLBUTRIN XL) 150 MG 24 hr tablet Take 150 mg by mouth every morning.     . busPIRone (BUSPAR) 15 MG tablet Take 15 mg by mouth 2 (two) times daily.     . Calcium Carbonate-Vit D-Min (CALCIUM 600+D PLUS MINERALS) 600-400 MG-UNIT TABS Take 1 tablet by mouth daily.     . Carboxymethylcellul-Glycerin (LUBRICATING EYE DROPS OP) Place 1 drop into both eyes daily as needed (dry eyes).    . Cholecalciferol (VITAMIN D) 50 MCG (2000 UT) tablet Take 2,000 Units by mouth daily.     . citalopram (CELEXA) 40 MG tablet Take 40 mg by mouth every morning.     . Cyanocobalamin 1000 MCG TBCR Take 1,000 mcg by mouth daily.     Marland Kitchen donepezil (ARICEPT) 10 MG tablet Take 10 mg by mouth 2 (two) times daily.    . feeding supplement, ENSURE ENLIVE, (ENSURE ENLIVE) LIQD Take 237 mLs by mouth 2 (two) times daily between meals. (Patient taking differently: Take 237 mLs by mouth  daily with breakfast.) 237 mL 12  . fluticasone (FLONASE) 50 MCG/ACT nasal spray Place 2 sprays into both nostrils daily as needed for allergies.     . Fluticasone-Salmeterol (ADVAIR) 250-50 MCG/DOSE AEPB Inhale 1 puff into the lungs 2 (two) times daily as needed (shortness of breath).     . gabapentin (NEURONTIN) 300 MG capsule Take 300 mg by mouth 4 (four) times daily.    Marland Kitchen lamoTRIgine (LAMICTAL) 25 MG tablet Take 50 mg by mouth at bedtime.    Marland Kitchen levothyroxine (SYNTHROID, LEVOTHROID) 50 MCG tablet Take 50 mcg by mouth daily before breakfast. Take 30 to 60 minutes before breakfast.    . memantine (NAMENDA) 10 MG tablet Take 10 mg by mouth 2 (two) times daily.     . montelukast (SINGULAIR) 10 MG tablet Take 10 mg by mouth at bedtime.     . Multiple Vitamin (MULTI-VITAMIN) tablet Take 1 tablet by mouth daily.     Marland Kitchen  Multiple Vitamins-Minerals (PRESERVISION AREDS 2) CAPS Take 1 capsule by mouth 2 (two) times a day.    Marland Kitchen NARCAN 4 MG/0.1ML LIQD nasal spray kit Place 1 spray into the nose once.     Marland Kitchen oxybutynin (DITROPAN) 5 MG tablet Take 5 mg by mouth 3 (three) times daily.    Marland Kitchen oxyCODONE-acetaminophen (PERCOCET) 10-325 MG tablet Take 1 tablet by mouth every 6 (six) hours as needed for moderate pain or severe pain.    . pantoprazole (PROTONIX) 20 MG tablet Take 20 mg by mouth 2 (two) times daily.     . pravastatin (PRAVACHOL) 20 MG tablet Take 20 mg by mouth at bedtime.     . traMADol (ULTRAM) 50 MG tablet Take 1 tablet (50 mg total) by mouth every 6 (six) hours as needed for moderate pain. 30 tablet 0  . vitamin C (ASCORBIC ACID) 500 MG tablet Take 500 mg by mouth daily.    . vitamin E 180 MG (400 UNITS) capsule Take 400 Units by mouth daily.     No current facility-administered medications for this visit.    OBJECTIVE: There were no vitals taken for this visit.   There is no height or weight on file to calculate BMI.    ECOG FS:1 - Symptomatic but completely ambulatory  General: Thin, no acute  distress. Eyes: Pink conjunctiva, anicteric sclera. HEENT: Normocephalic, moist mucous membranes. Lungs: No audible wheezing or coughing. Heart: Regular rate and rhythm. Abdomen: Soft, nontender, no obvious distention. Musculoskeletal: No edema, cyanosis, or clubbing. Neuro: Alert, answering all questions appropriately. Cranial nerves grossly intact. Skin: No rashes or petechiae noted. Psych: Normal affect.   LAB RESULTS:  No visits with results within 3 Day(s) from this visit.  Latest known visit with results is:  No results displayed because visit has over 200 results.      STUDIES: No results found.  ASSESSMENT:  Chronic refractory ITP  PLAN:   1. Chronic refractory ITP: Patient had a poor response to Prednisone, WinRho, and Rituxan. She is status post splenectomy in 2007.  She received IVIG and Nplate she received while at [facility] with significant improvement of her platelets.  If Nplate no longer was effective, could retry IVIG at a future date.  Patient is currently receiving the max dose of Nplate at 10 mcg/kg.  Can also consider Promacta 12.52m or Tavalisse 100 mg BID if needed.  Platelet count is 20 today, therefore proceed with treatment as planned.  Return to clinic in 4 weeks for repeat laboratory work, further evaluation, and continuation of Nplate.    2. Weakness and fatigue: Chronic and unchanged. 3.  Depression/grief: Improved.  Significantly improved. 4.  Poor appetite/weight loss: Resolved.  5.  Dementia: Chronic and unchanged.  Patient is now at [employer] [facility].  Leukocytosis: Mild. 7.  Anemia: Chronic and unchanged. 8.  Left shoulder pain: Resolved.  Patient expressed understanding and was in agreement with this plan. She also understands that She can call clinic at any time with any questions, concerns, or complaints.    TLloyd Huger MD   11/22/2020 4:23 PM

## 2020-11-25 ENCOUNTER — Inpatient Hospital Stay: Payer: Medicare HMO

## 2020-11-25 ENCOUNTER — Inpatient Hospital Stay: Payer: Medicare HMO | Admitting: Oncology

## 2020-11-25 ENCOUNTER — Telehealth: Payer: Self-pay | Admitting: Oncology

## 2020-11-25 DIAGNOSIS — G309 Alzheimer's disease, unspecified: Secondary | ICD-10-CM | POA: Diagnosis not present

## 2020-11-25 DIAGNOSIS — F32A Depression, unspecified: Secondary | ICD-10-CM | POA: Diagnosis not present

## 2020-11-25 DIAGNOSIS — I10 Essential (primary) hypertension: Secondary | ICD-10-CM | POA: Diagnosis not present

## 2020-11-25 DIAGNOSIS — D693 Immune thrombocytopenic purpura: Secondary | ICD-10-CM

## 2020-11-25 DIAGNOSIS — J45909 Unspecified asthma, uncomplicated: Secondary | ICD-10-CM | POA: Diagnosis not present

## 2020-11-25 DIAGNOSIS — F0280 Dementia in other diseases classified elsewhere without behavioral disturbance: Secondary | ICD-10-CM | POA: Diagnosis not present

## 2020-11-25 DIAGNOSIS — D649 Anemia, unspecified: Secondary | ICD-10-CM | POA: Diagnosis not present

## 2020-11-25 DIAGNOSIS — K449 Diaphragmatic hernia without obstruction or gangrene: Secondary | ICD-10-CM | POA: Diagnosis not present

## 2020-11-25 DIAGNOSIS — S72041D Displaced fracture of base of neck of right femur, subsequent encounter for closed fracture with routine healing: Secondary | ICD-10-CM | POA: Diagnosis not present

## 2020-11-25 DIAGNOSIS — F419 Anxiety disorder, unspecified: Secondary | ICD-10-CM | POA: Diagnosis not present

## 2020-11-25 NOTE — Telephone Encounter (Signed)
Pt's husband called to r/s appt due to pt having hip replacement surgery. He states she is not able to travel at this time. Appt rescheduled for 2/23.

## 2020-11-25 NOTE — Telephone Encounter (Signed)
Thanks for the update

## 2020-11-26 DIAGNOSIS — M25552 Pain in left hip: Secondary | ICD-10-CM | POA: Diagnosis not present

## 2020-11-27 ENCOUNTER — Ambulatory Visit: Payer: Medicare HMO | Admitting: Oncology

## 2020-11-27 ENCOUNTER — Ambulatory Visit: Payer: Medicare HMO

## 2020-11-27 ENCOUNTER — Other Ambulatory Visit: Payer: Medicare HMO

## 2020-11-27 DIAGNOSIS — I10 Essential (primary) hypertension: Secondary | ICD-10-CM | POA: Diagnosis not present

## 2020-11-27 DIAGNOSIS — D649 Anemia, unspecified: Secondary | ICD-10-CM | POA: Diagnosis not present

## 2020-11-27 DIAGNOSIS — J45909 Unspecified asthma, uncomplicated: Secondary | ICD-10-CM | POA: Diagnosis not present

## 2020-11-27 DIAGNOSIS — S72041D Displaced fracture of base of neck of right femur, subsequent encounter for closed fracture with routine healing: Secondary | ICD-10-CM | POA: Diagnosis not present

## 2020-11-27 DIAGNOSIS — F419 Anxiety disorder, unspecified: Secondary | ICD-10-CM | POA: Diagnosis not present

## 2020-11-27 DIAGNOSIS — F32A Depression, unspecified: Secondary | ICD-10-CM | POA: Diagnosis not present

## 2020-11-27 DIAGNOSIS — G309 Alzheimer's disease, unspecified: Secondary | ICD-10-CM | POA: Diagnosis not present

## 2020-11-27 DIAGNOSIS — F0280 Dementia in other diseases classified elsewhere without behavioral disturbance: Secondary | ICD-10-CM | POA: Diagnosis not present

## 2020-11-27 DIAGNOSIS — K449 Diaphragmatic hernia without obstruction or gangrene: Secondary | ICD-10-CM | POA: Diagnosis not present

## 2020-11-28 DIAGNOSIS — I10 Essential (primary) hypertension: Secondary | ICD-10-CM | POA: Diagnosis not present

## 2020-11-28 DIAGNOSIS — F32A Depression, unspecified: Secondary | ICD-10-CM | POA: Diagnosis not present

## 2020-11-28 DIAGNOSIS — F0280 Dementia in other diseases classified elsewhere without behavioral disturbance: Secondary | ICD-10-CM | POA: Diagnosis not present

## 2020-11-28 DIAGNOSIS — S72041D Displaced fracture of base of neck of right femur, subsequent encounter for closed fracture with routine healing: Secondary | ICD-10-CM | POA: Diagnosis not present

## 2020-11-28 DIAGNOSIS — K449 Diaphragmatic hernia without obstruction or gangrene: Secondary | ICD-10-CM | POA: Diagnosis not present

## 2020-11-28 DIAGNOSIS — G309 Alzheimer's disease, unspecified: Secondary | ICD-10-CM | POA: Diagnosis not present

## 2020-11-28 DIAGNOSIS — F419 Anxiety disorder, unspecified: Secondary | ICD-10-CM | POA: Diagnosis not present

## 2020-11-28 DIAGNOSIS — D649 Anemia, unspecified: Secondary | ICD-10-CM | POA: Diagnosis not present

## 2020-11-28 DIAGNOSIS — J45909 Unspecified asthma, uncomplicated: Secondary | ICD-10-CM | POA: Diagnosis not present

## 2020-11-29 DIAGNOSIS — M1712 Unilateral primary osteoarthritis, left knee: Secondary | ICD-10-CM | POA: Diagnosis not present

## 2020-11-29 DIAGNOSIS — M1711 Unilateral primary osteoarthritis, right knee: Secondary | ICD-10-CM | POA: Diagnosis not present

## 2020-11-29 DIAGNOSIS — M170 Bilateral primary osteoarthritis of knee: Secondary | ICD-10-CM | POA: Diagnosis not present

## 2020-12-01 NOTE — Progress Notes (Signed)
[facility]  [phone]  [phone]     Ariana [name] DOB: 06/04/1933  MR#: [id number]  CSN#:[id number]  Patient Care Team: Sparks, Jeffrey D, MD as PCP - General (Internal Medicine) Levon Boettcher J, MD as Consulting Physician (Oncology)   CHIEF COMPLAINT: Chronic refractory ITP  INTERVAL HISTORY: Patient returns to clinic today for repeat laboratory work, further evaluation, and continuation of Nplate.  She is recently admitted to the hospital with nonpathologic hip fracture.  Her memory seems slightly worse today and she is accompanied by her husband.  She has increased weakness and fatigue as well as increased bruising.  She has no neurologic complaints.  She denies any recent fevers or illnesses.  She denies any chest pain, shortness of breath, cough, or hemoptysis.  She denies any nausea, vomiting, constipation, or diarrhea.  She has no melena or hematochezia.  She has no urinary complaints.  Patient offers no further specific complaints today.  REVIEW OF SYSTEMS:   Review of Systems  Constitutional: Positive for malaise/fatigue. Negative for fever and weight loss.  HENT: Negative for congestion.   Respiratory: Negative.  Negative for cough, hemoptysis and shortness of breath.   Cardiovascular: Negative.  Negative for chest pain and leg swelling.  Gastrointestinal: Negative.  Negative for abdominal pain, blood in stool, diarrhea, melena and nausea.  Genitourinary: Negative.  Negative for dysuria and hematuria.  Musculoskeletal: Positive for joint pain. Negative for back pain and falls.  Skin: Negative.  Negative for rash.  Neurological: Positive for weakness. Negative for dizziness, sensory change, focal weakness and headaches.  Endo/Heme/Allergies: Bruises/bleeds easily.  Psychiatric/Behavioral: Positive for memory loss. Negative for depression. The patient is not nervous/anxious.     As per HPI. Otherwise, a complete review of  systems is negative.  PAST MEDICAL HISTORY: Past Medical History:  Diagnosis Date  . Alzheimer disease ([facility])   . Anemia   . Anxiety   . Asthma    WELL CONTROLLED  . Chronic back pain   . Depression   . Depression   . GERD (gastroesophageal reflux disease)   . Hiatal hernia   . Hypercholesteremia   . Hypothyroidism   . ITP (idiopathic thrombocytopenic purpura)    FOLLOWED BR DR Brenlee Koskela  . LBBB (left bundle branch block)   . Osteoarthritis   . Osteoporosis   . Restless legs     PAST SURGICAL HISTORY: Past Surgical History:  Procedure Laterality Date  . CARPAL TUNNEL RELEASE    . ESOPHAGOGASTRODUODENOSCOPY (EGD) WITH PROPOFOL N/A 09/10/2015   Procedure: ESOPHAGOGASTRODUODENOSCOPY (EGD) WITH PROPOFOL;  Surgeon: Martin U Skulskie, MD;  Location: [facility] ENDOSCOPY;  Service: Endoscopy;  Laterality: N/A;  . HIP PINNING,CANNULATED Left 10/31/2020   Procedure: CANNULATED HIP PINNING;  Surgeon: Patel, Sunny, MD;  Location: [facility] ORS;  Service: Orthopedics;  Laterality: Left;  . IRRIGATION AND DEBRIDEMENT HEMATOMA Right 07/13/2018   Procedure: IRRIGATION AND DEBRIDEMENT HEMATOMA-RIGHT SHIN;  Surgeon: Cintron-Diaz, Edgardo, MD;  Location: [facility] ORS;  Service: General;  Laterality: Right;  . KYPHOPLASTY N/A 05/02/2019   Procedure: L1 KYPHOPLASTY;  Surgeon: Menz, Michael, MD;  Location: [facility] ORS;  Service: Orthopedics;  Laterality: N/A;  . LUMBAR [place]    . REVERSE SHOULDER ARTHROPLASTY Left 06/25/2020   Procedure: REVERSE SHOULDER ARTHROPLASTY;  Surgeon: Poggi, John J, MD;  Location: [facility] ORS;  Service: Orthopedics;  Laterality: Left;  . SPLENECTOMY, PARTIAL    . TOTAL ABDOMINAL HYSTERECTOMY      FAMILY HISTORY Family History  Problem Relation Age   of Onset  . Stroke Mother     GYNECOLOGIC HISTORY:  No LMP recorded. Patient has had a hysterectomy.     ADVANCED DIRECTIVES:    HEALTH MAINTENANCE: Social History   Tobacco Use  . Smoking status: Never Smoker  . Smokeless  tobacco: Never Used  Vaping Use  . Vaping Use: Never used  Substance Use Topics  . Alcohol use: No    Alcohol/week: 0.0 standard drinks  . Drug use: No     Allergies  Allergen Reactions  . Aspirin     Upset stomach  . Diazepam Itching    sneezing  . Other     seasonal allergies  . Tetanus Toxoids     Localized superficial swelling of skin  . Morphine Itching and Rash    Current Outpatient Medications  Medication Sig Dispense Refill  . acetaminophen (TYLENOL) 325 MG tablet Take 2 tablets (650 mg total) by mouth every 6 (six) hours as needed for mild pain (or Fever >/= 101). 100 tablet 0  . albuterol (PROVENTIL HFA;VENTOLIN HFA) 108 (90 BASE) MCG/ACT inhaler Inhale 2 puffs into the lungs every 6 (six) hours as needed for wheezing or shortness of breath.    . ALPRAZolam (XANAX) 0.5 MG tablet Take 1 tablet (0.5 mg total) by mouth 3 (three) times daily as needed for anxiety. 20 tablet 0  . amLODipine (NORVASC) 5 MG tablet Take 1 tablet (5 mg total) by mouth daily. 30 tablet 0  . benzonatate (TESSALON) 200 MG capsule Take 200 mg by mouth 3 (three) times daily.    . buPROPion (WELLBUTRIN XL) 150 MG 24 hr tablet Take 150 mg by mouth every morning.     . busPIRone (BUSPAR) 15 MG tablet Take 15 mg by mouth 2 (two) times daily.     . Calcium Carbonate-Vit D-Min (CALCIUM 600+D PLUS MINERALS) 600-400 MG-UNIT TABS Take 1 tablet by mouth daily.     . Carboxymethylcellul-Glycerin (LUBRICATING EYE DROPS OP) Place 1 drop into both eyes daily as needed (dry eyes).    . Cholecalciferol (VITAMIN D) 50 MCG (2000 UT) tablet Take 2,000 Units by mouth daily.     . citalopram (CELEXA) 40 MG tablet Take 40 mg by mouth every morning.     . Cyanocobalamin 1000 MCG TBCR Take 1,000 mcg by mouth daily.     . donepezil (ARICEPT) 10 MG tablet Take 10 mg by mouth 2 (two) times daily.    . feeding supplement, ENSURE ENLIVE, (ENSURE ENLIVE) LIQD Take 237 mLs by mouth 2 (two) times daily between meals. (Patient  taking differently: Take 237 mLs by mouth daily with breakfast.) 237 mL 12  . fluticasone (FLONASE) 50 MCG/ACT nasal spray Place 2 sprays into both nostrils daily as needed for allergies.     . Fluticasone-Salmeterol (ADVAIR) 250-50 MCG/DOSE AEPB Inhale 1 puff into the lungs 2 (two) times daily as needed (shortness of breath).     . gabapentin (NEURONTIN) 300 MG capsule Take 300 mg by mouth 4 (four) times daily.    . lamoTRIgine (LAMICTAL) 25 MG tablet Take 50 mg by mouth at bedtime.    . levothyroxine (SYNTHROID, LEVOTHROID) 50 MCG tablet Take 50 mcg by mouth daily before breakfast. Take 30 to 60 minutes before breakfast.    . memantine (NAMENDA) 10 MG tablet Take 10 mg by mouth 2 (two) times daily.     . montelukast (SINGULAIR) 10 MG tablet Take 10 mg by mouth at bedtime.     . Multiple Vitamin (  MULTI-VITAMIN) tablet Take 1 tablet by mouth daily.     . Multiple Vitamins-Minerals (PRESERVISION AREDS 2) CAPS Take 1 capsule by mouth 2 (two) times a day.    . oxybutynin (DITROPAN) 5 MG tablet Take 5 mg by mouth 3 (three) times daily.    Marland Kitchen oxyCODONE-acetaminophen (PERCOCET) 10-325 MG tablet Take 1 tablet by mouth every 6 (six) hours as needed for moderate pain or severe pain.    . pantoprazole (PROTONIX) 20 MG tablet Take 20 mg by mouth 2 (two) times daily.     . pravastatin (PRAVACHOL) 20 MG tablet Take 20 mg by mouth at bedtime.     . vitamin C (ASCORBIC ACID) 500 MG tablet Take 500 mg by mouth daily.    . vitamin E 180 MG (400 UNITS) capsule Take 400 Units by mouth daily.    Marland Kitchen NARCAN 4 MG/0.1ML LIQD nasal spray kit Place 1 spray into the nose once.  (Patient not taking: Reported on 12/04/2020)    . traMADol (ULTRAM) 50 MG tablet Take 1 tablet (50 mg total) by mouth every 6 (six) hours as needed for moderate pain. (Patient not taking: Reported on 12/04/2020) 30 tablet 0   No current facility-administered medications for this visit.    OBJECTIVE: BP 134/85 (BP Location: Left Arm, Patient Position:  Sitting)   Pulse (!) 55   Temp 99.3 F (37.4 C) (Tympanic)   Resp 18   Wt 118 lb (53.5 kg)   BMI 21.58 kg/m    Body mass index is 21.58 kg/m.    ECOG FS:2 - Symptomatic, <50% confined to bed  General: Thin, no acute distress. Eyes: Pink conjunctiva, anicteric sclera. HEENT: Normocephalic, moist mucous membranes. Lungs: No audible wheezing or coughing. Heart: Regular rate and rhythm. Abdomen: Soft, nontender, no obvious distention. Musculoskeletal: No edema, cyanosis, or clubbing. Neuro: Alert, confused. Cranial nerves grossly intact. Skin: No rashes or petechiae noted. Psych: Normal affect.   LAB RESULTS:  Appointment on 12/04/2020  Component Date Value Ref Range Status  . WBC 12/04/2020 15.7* 4.0 - 10.5 K/uL Final  . RBC 12/04/2020 4.02  3.87 - 5.11 MIL/uL Final  . Hemoglobin 12/04/2020 12.1  12.0 - 15.0 g/dL Final  . HCT 12/04/2020 36.7  36.0 - 46.0 % Final  . MCV 12/04/2020 91.3  80.0 - 100.0 fL Final  . MCH 12/04/2020 30.1  26.0 - 34.0 pg Final  . MCHC 12/04/2020 33.0  30.0 - 36.0 g/dL Final  . RDW 12/04/2020 19.4* 11.5 - 15.5 % Final  . Platelets 12/04/2020 <5* 150 - 400 K/uL Final   Comment: REPEATED TO VERIFY PLATELET COUNT CONFIRMED BY SMEAR THIS CRITICAL RESULT HAS VERIFIED AND BEEN CALLED TO DR Grayland Ormond BY KIM ROOS ON 02 23 2022 AT 1003, AND HAS BEEN READ BACK.    Marland Kitchen nRBC 12/04/2020 0.0  0.0 - 0.2 % Final  . Neutrophils Relative % 12/04/2020 84  % Final  . Neutro Abs 12/04/2020 13.3* 1.7 - 7.7 K/uL Final  . Lymphocytes Relative 12/04/2020 8  % Final  . Lymphs Abs 12/04/2020 1.3  0.7 - 4.0 K/uL Final  . Monocytes Relative 12/04/2020 7  % Final  . Monocytes Absolute 12/04/2020 1.1* 0.1 - 1.0 K/uL Final  . Eosinophils Relative 12/04/2020 0  % Final  . Eosinophils Absolute 12/04/2020 0.0  0.0 - 0.5 K/uL Final  . Basophils Relative 12/04/2020 0  % Final  . Basophils Absolute 12/04/2020 0.0  0.0 - 0.1 K/uL Final  . Immature Granulocytes 12/04/2020 1  %  Final  .  Abs Immature Granulocytes 12/04/2020 0.09* 0.00 - 0.07 K/uL Final   Performed at [facility], [address] [place], [place] [address]    STUDIES: No results found.  ASSESSMENT:  Chronic refractory ITP  PLAN:   1. Chronic refractory ITP: Patient had a poor response to Prednisone, WinRho, and Rituxan. She is status post splenectomy in 2007.  She received IVIG and Nplate while in the hospital with significant improvement of her platelet count.   If Nplate no longer was effective, could retry IVIG at a future date.  Patient is currently receiving the max dose of Nplate at 10 mcg/kg.  Can also consider Promacta 12.5mg or Tavalisse 100 mg BID if needed.  Patient's treatment was delayed 1 week secondary to her fractured hip and is decreased to 5.  Proceed with Nplate today.  Return to clinic in 1 week for further evaluation and consideration of additional treatment.    2. Weakness and fatigue: Chronic and unchanged. 3.  Depression/grief: Improved.  Significantly improved. 4.  Poor appetite/weight loss: Resolved.  5.  Dementia: Appears slightly worse today.  Patient is now at [facility]. 6.  Leukocytosis: Chronic and unchanged. 7.  Anemia: Resolved. 8.  Fractured hip: Continue follow-up with orthopedics as scheduled.   Patient expressed understanding and was in agreement with this plan. She also understands that She can call clinic at any time with any questions, concerns, or complaints.     J , MD   12/05/2020 6:23 AM        

## 2020-12-02 DIAGNOSIS — I10 Essential (primary) hypertension: Secondary | ICD-10-CM | POA: Diagnosis not present

## 2020-12-02 DIAGNOSIS — F0280 Dementia in other diseases classified elsewhere without behavioral disturbance: Secondary | ICD-10-CM | POA: Diagnosis not present

## 2020-12-02 DIAGNOSIS — K449 Diaphragmatic hernia without obstruction or gangrene: Secondary | ICD-10-CM | POA: Diagnosis not present

## 2020-12-02 DIAGNOSIS — F32A Depression, unspecified: Secondary | ICD-10-CM | POA: Diagnosis not present

## 2020-12-02 DIAGNOSIS — G309 Alzheimer's disease, unspecified: Secondary | ICD-10-CM | POA: Diagnosis not present

## 2020-12-02 DIAGNOSIS — J45909 Unspecified asthma, uncomplicated: Secondary | ICD-10-CM | POA: Diagnosis not present

## 2020-12-02 DIAGNOSIS — D649 Anemia, unspecified: Secondary | ICD-10-CM | POA: Diagnosis not present

## 2020-12-02 DIAGNOSIS — S72041D Displaced fracture of base of neck of right femur, subsequent encounter for closed fracture with routine healing: Secondary | ICD-10-CM | POA: Diagnosis not present

## 2020-12-02 DIAGNOSIS — F419 Anxiety disorder, unspecified: Secondary | ICD-10-CM | POA: Diagnosis not present

## 2020-12-03 DIAGNOSIS — F419 Anxiety disorder, unspecified: Secondary | ICD-10-CM | POA: Diagnosis not present

## 2020-12-03 DIAGNOSIS — G309 Alzheimer's disease, unspecified: Secondary | ICD-10-CM | POA: Diagnosis not present

## 2020-12-03 DIAGNOSIS — F0280 Dementia in other diseases classified elsewhere without behavioral disturbance: Secondary | ICD-10-CM | POA: Diagnosis not present

## 2020-12-03 DIAGNOSIS — K449 Diaphragmatic hernia without obstruction or gangrene: Secondary | ICD-10-CM | POA: Diagnosis not present

## 2020-12-03 DIAGNOSIS — D649 Anemia, unspecified: Secondary | ICD-10-CM | POA: Diagnosis not present

## 2020-12-03 DIAGNOSIS — J45909 Unspecified asthma, uncomplicated: Secondary | ICD-10-CM | POA: Diagnosis not present

## 2020-12-03 DIAGNOSIS — F32A Depression, unspecified: Secondary | ICD-10-CM | POA: Diagnosis not present

## 2020-12-03 DIAGNOSIS — I10 Essential (primary) hypertension: Secondary | ICD-10-CM | POA: Diagnosis not present

## 2020-12-03 DIAGNOSIS — S72041D Displaced fracture of base of neck of right femur, subsequent encounter for closed fracture with routine healing: Secondary | ICD-10-CM | POA: Diagnosis not present

## 2020-12-04 ENCOUNTER — Inpatient Hospital Stay: Payer: Medicare HMO

## 2020-12-04 ENCOUNTER — Other Ambulatory Visit: Payer: Self-pay

## 2020-12-04 ENCOUNTER — Inpatient Hospital Stay: Payer: Medicare HMO | Attending: Oncology | Admitting: Oncology

## 2020-12-04 ENCOUNTER — Encounter: Payer: Self-pay | Admitting: Oncology

## 2020-12-04 VITALS — BP 134/85 | HR 55 | Temp 99.3°F | Resp 18 | Wt 118.0 lb

## 2020-12-04 DIAGNOSIS — D693 Immune thrombocytopenic purpura: Secondary | ICD-10-CM

## 2020-12-04 LAB — CBC WITH DIFFERENTIAL/PLATELET
Abs Immature Granulocytes: 0.09 10*3/uL — ABNORMAL HIGH (ref 0.00–0.07)
Basophils Absolute: 0 10*3/uL (ref 0.0–0.1)
Basophils Relative: 0 %
Eosinophils Absolute: 0 10*3/uL (ref 0.0–0.5)
Eosinophils Relative: 0 %
HCT: 36.7 % (ref 36.0–46.0)
Hemoglobin: 12.1 g/dL (ref 12.0–15.0)
Immature Granulocytes: 1 %
Lymphocytes Relative: 8 %
Lymphs Abs: 1.3 10*3/uL (ref 0.7–4.0)
MCH: 30.1 pg (ref 26.0–34.0)
MCHC: 33 g/dL (ref 30.0–36.0)
MCV: 91.3 fL (ref 80.0–100.0)
Monocytes Absolute: 1.1 10*3/uL — ABNORMAL HIGH (ref 0.1–1.0)
Monocytes Relative: 7 %
Neutro Abs: 13.3 10*3/uL — ABNORMAL HIGH (ref 1.7–7.7)
Neutrophils Relative %: 84 %
Platelets: <5 10*3/uL — CL (ref 150–400)
RBC: 4.02 MIL/uL (ref 3.87–5.11)
RDW: 19.4 % — ABNORMAL HIGH (ref 11.5–15.5)
WBC: 15.7 10*3/uL — ABNORMAL HIGH (ref 4.0–10.5)
nRBC: 0 % (ref 0.0–0.2)

## 2020-12-04 MED ORDER — ROMIPLOSTIM INJECTION 500 MCG
10.0000 ug/kg | Freq: Once | SUBCUTANEOUS | Status: AC
  Administered 2020-12-04: 535 ug via SUBCUTANEOUS
  Filled 2020-12-04: qty 1.07

## 2020-12-04 NOTE — Progress Notes (Unsigned)
Pt and husband in for follow up, reports pt fell about 3 weeks ago and broke left hip.  Pt was unable to go to rehab facility and stayed at the hospital.  Pt with significant weight loss, from 147 lbs on 10/31/20 to 118 lbs today.  Pt and husband reports decreased appetite.

## 2020-12-05 DIAGNOSIS — S72041D Displaced fracture of base of neck of right femur, subsequent encounter for closed fracture with routine healing: Secondary | ICD-10-CM | POA: Diagnosis not present

## 2020-12-05 DIAGNOSIS — I10 Essential (primary) hypertension: Secondary | ICD-10-CM | POA: Diagnosis not present

## 2020-12-05 DIAGNOSIS — G309 Alzheimer's disease, unspecified: Secondary | ICD-10-CM | POA: Diagnosis not present

## 2020-12-05 DIAGNOSIS — F0280 Dementia in other diseases classified elsewhere without behavioral disturbance: Secondary | ICD-10-CM | POA: Diagnosis not present

## 2020-12-05 DIAGNOSIS — F419 Anxiety disorder, unspecified: Secondary | ICD-10-CM | POA: Diagnosis not present

## 2020-12-05 DIAGNOSIS — D649 Anemia, unspecified: Secondary | ICD-10-CM | POA: Diagnosis not present

## 2020-12-05 DIAGNOSIS — K449 Diaphragmatic hernia without obstruction or gangrene: Secondary | ICD-10-CM | POA: Diagnosis not present

## 2020-12-05 DIAGNOSIS — F32A Depression, unspecified: Secondary | ICD-10-CM | POA: Diagnosis not present

## 2020-12-05 DIAGNOSIS — J45909 Unspecified asthma, uncomplicated: Secondary | ICD-10-CM | POA: Diagnosis not present

## 2020-12-06 DIAGNOSIS — K449 Diaphragmatic hernia without obstruction or gangrene: Secondary | ICD-10-CM | POA: Diagnosis not present

## 2020-12-06 DIAGNOSIS — S72041D Displaced fracture of base of neck of right femur, subsequent encounter for closed fracture with routine healing: Secondary | ICD-10-CM | POA: Diagnosis not present

## 2020-12-06 DIAGNOSIS — D649 Anemia, unspecified: Secondary | ICD-10-CM | POA: Diagnosis not present

## 2020-12-06 DIAGNOSIS — G309 Alzheimer's disease, unspecified: Secondary | ICD-10-CM | POA: Diagnosis not present

## 2020-12-06 DIAGNOSIS — F32A Depression, unspecified: Secondary | ICD-10-CM | POA: Diagnosis not present

## 2020-12-06 DIAGNOSIS — F0280 Dementia in other diseases classified elsewhere without behavioral disturbance: Secondary | ICD-10-CM | POA: Diagnosis not present

## 2020-12-06 DIAGNOSIS — J45909 Unspecified asthma, uncomplicated: Secondary | ICD-10-CM | POA: Diagnosis not present

## 2020-12-06 DIAGNOSIS — I10 Essential (primary) hypertension: Secondary | ICD-10-CM | POA: Diagnosis not present

## 2020-12-06 DIAGNOSIS — F419 Anxiety disorder, unspecified: Secondary | ICD-10-CM | POA: Diagnosis not present

## 2020-12-07 NOTE — Progress Notes (Signed)
Ariana [name]  Telephone:(336) [phone]  [phone]) [phone]     Ariana [name] DOB: 06/17/1933  MR#: [id number]  TIR#:[id number]  Patient Care Team: Idelle Crouch, MD as PCP - General (Internal Medicine) Lloyd Huger, MD as Consulting Physician (Oncology)   CHIEF COMPLAINT: Chronic refractory ITP  INTERVAL HISTORY: Patient returns to clinic today for repeat laboratory work, further evaluation, and consideration of additional Nplate.  She continues to have increased confusion.  She is slowly recovering from her nonpathologic hip fracture.  Her weakness and fatigue as well as bruising have improved.  She has no neurologic complaints.  She denies any recent fevers or illnesses. She denies any chest pain, shortness of breath, cough, or hemoptysis.  She denies any nausea, vomiting, constipation, or diarrhea.  She has no melena or hematochezia.  She has no urinary complaints.  Patient offers no further specific complaints today.  REVIEW OF SYSTEMS:   Review of Systems  Constitutional: Positive for malaise/fatigue. Negative for fever and weight loss.  HENT: Negative for congestion.   Respiratory: Negative.  Negative for cough, hemoptysis and shortness of breath.   Cardiovascular: Negative.  Negative for chest pain and leg swelling.  Gastrointestinal: Negative.  Negative for abdominal pain, blood in stool, diarrhea, melena and nausea.  Genitourinary: Negative.  Negative for dysuria and hematuria.  Musculoskeletal: Positive for joint pain. Negative for back pain and falls.  Skin: Negative.  Negative for rash.  Neurological: Positive for weakness. Negative for dizziness, sensory change, focal weakness and headaches.  Endo/Heme/Allergies: Does not bruise/bleed easily.  Psychiatric/Behavioral: Positive for memory loss. Negative for depression. The patient is not nervous/anxious.     As per HPI. Otherwise, a complete review of systems is negative.  PAST MEDICAL  HISTORY: Past Medical History:  Diagnosis Date  . Alzheimer disease ([place])   . Anemia   . Anxiety   . Asthma    WELL CONTROLLED  . Chronic back pain   . Depression   . Depression   . GERD (gastroesophageal reflux disease)   . Hiatal hernia   . Hypercholesteremia   . Hypothyroidism   . ITP (idiopathic thrombocytopenic purpura)    FOLLOWED BR DR Grayland Ormond  . LBBB (left bundle branch block)   . Osteoarthritis   . Osteoporosis   . Restless legs     PAST SURGICAL HISTORY: Past Surgical History:  Procedure Laterality Date  . CARPAL TUNNEL RELEASE    . ESOPHAGOGASTRODUODENOSCOPY (EGD) WITH PROPOFOL N/A 09/10/2015   Procedure: ESOPHAGOGASTRODUODENOSCOPY (EGD) WITH PROPOFOL;  Surgeon: Lollie Sails, MD;  Location: [facility] ENDOSCOPY;  Service: Endoscopy;  Laterality: N/A;  . HIP PINNING,CANNULATED Left 10/31/2020   Procedure: CANNULATED HIP PINNING;  Surgeon: Leim Fabry, MD;  Location: [facility] ORS;  Service: Orthopedics;  Laterality: Left;  . IRRIGATION AND DEBRIDEMENT HEMATOMA Right 07/13/2018   Procedure: IRRIGATION AND DEBRIDEMENT HEMATOMA-RIGHT SHIN;  Surgeon: Herbert Pun, MD;  Location: [facility] ORS;  Service: General;  Laterality: Right;  . KYPHOPLASTY N/A 05/02/2019   Procedure: L1 KYPHOPLASTY;  Surgeon: Hessie Knows, MD;  Location: [facility] ORS;  Service: Orthopedics;  Laterality: N/A;  . LUMBAR [place]    . REVERSE SHOULDER ARTHROPLASTY Left 06/25/2020   Procedure: REVERSE SHOULDER ARTHROPLASTY;  Surgeon: Corky Mull, MD;  Location: [facility] ORS;  Service: Orthopedics;  Laterality: Left;  . SPLENECTOMY, PARTIAL    . TOTAL ABDOMINAL HYSTERECTOMY      FAMILY HISTORY Family History  Problem Relation Age of Onset  . Stroke Mother  GYNECOLOGIC HISTORY:  No LMP recorded. Patient has had a hysterectomy.     ADVANCED DIRECTIVES:    HEALTH MAINTENANCE: Social History   Tobacco Use  . Smoking status: Never Smoker  . Smokeless tobacco: Never Used  Vaping Use  .  Vaping Use: Never used  Substance Use Topics  . Alcohol use: No    Alcohol/week: 0.0 standard drinks  . Drug use: No     Allergies  Allergen Reactions  . Aspirin     Upset stomach  . Diazepam Itching    sneezing  . Other     seasonal allergies  . Tetanus Toxoids     Localized superficial swelling of skin  . Morphine Itching and Rash    Current Outpatient Medications  Medication Sig Dispense Refill  . acetaminophen (TYLENOL) 325 MG tablet Take 2 tablets (650 mg total) by mouth every 6 (six) hours as needed for mild pain (or Fever >/= 101). 100 tablet 0  . albuterol (PROVENTIL HFA;VENTOLIN HFA) 108 (90 BASE) MCG/ACT inhaler Inhale 2 puffs into the lungs every 6 (six) hours as needed for wheezing or shortness of breath.    . ALPRAZolam (XANAX) 0.5 MG tablet Take 1 tablet (0.5 mg total) by mouth 3 (three) times daily as needed for anxiety. 20 tablet 0  . amLODipine (NORVASC) 5 MG tablet Take 1 tablet (5 mg total) by mouth daily. 30 tablet 0  . benzonatate (TESSALON) 200 MG capsule Take 200 mg by mouth 3 (three) times daily.    Marland Kitchen buPROPion (WELLBUTRIN XL) 150 MG 24 hr tablet Take 150 mg by mouth every morning.     . busPIRone (BUSPAR) 15 MG tablet Take 15 mg by mouth 2 (two) times daily.     . Calcium Carbonate-Vit D-Min (CALCIUM 600+D PLUS MINERALS) 600-400 MG-UNIT TABS Take 1 tablet by mouth daily.     . Carboxymethylcellul-Glycerin (LUBRICATING EYE DROPS OP) Place 1 drop into both eyes daily as needed (dry eyes).    . Cholecalciferol (VITAMIN D) 50 MCG (2000 UT) tablet Take 2,000 Units by mouth daily.     . citalopram (CELEXA) 40 MG tablet Take 40 mg by mouth every morning.     . Cyanocobalamin 1000 MCG TBCR Take 1,000 mcg by mouth daily.     Marland Kitchen donepezil (ARICEPT) 10 MG tablet Take 10 mg by mouth 2 (two) times daily.    . feeding supplement, ENSURE ENLIVE, (ENSURE ENLIVE) LIQD Take 237 mLs by mouth 2 (two) times daily between meals. (Patient taking differently: Take 237 mLs by mouth  daily with breakfast.) 237 mL 12  . fluticasone (FLONASE) 50 MCG/ACT nasal spray Place 2 sprays into both nostrils daily as needed for allergies.     . Fluticasone-Salmeterol (ADVAIR) 250-50 MCG/DOSE AEPB Inhale 1 puff into the lungs 2 (two) times daily as needed (shortness of breath).     . gabapentin (NEURONTIN) 300 MG capsule Take 300 mg by mouth 4 (four) times daily.    Marland Kitchen lamoTRIgine (LAMICTAL) 25 MG tablet Take 50 mg by mouth at bedtime.    Marland Kitchen levothyroxine (SYNTHROID, LEVOTHROID) 50 MCG tablet Take 50 mcg by mouth daily before breakfast. Take 30 to 60 minutes before breakfast.    . memantine (NAMENDA) 10 MG tablet Take 10 mg by mouth 2 (two) times daily.     . montelukast (SINGULAIR) 10 MG tablet Take 10 mg by mouth at bedtime.     . Multiple Vitamin (MULTI-VITAMIN) tablet Take 1 tablet by mouth daily.     Marland Kitchen  Multiple Vitamins-Minerals (PRESERVISION AREDS 2) CAPS Take 1 capsule by mouth 2 (two) times a day.    . oxybutynin (DITROPAN) 5 MG tablet Take 5 mg by mouth 3 (three) times daily.    Marland Kitchen oxyCODONE-acetaminophen (PERCOCET) 10-325 MG tablet Take 1 tablet by mouth every 6 (six) hours as needed for moderate pain or severe pain.    . pantoprazole (PROTONIX) 20 MG tablet Take 20 mg by mouth 2 (two) times daily.     . pravastatin (PRAVACHOL) 20 MG tablet Take 20 mg by mouth at bedtime.     . vitamin C (ASCORBIC ACID) 500 MG tablet Take 500 mg by mouth daily.    . vitamin E 180 MG (400 UNITS) capsule Take 400 Units by mouth daily.    Marland Kitchen NARCAN 4 MG/0.1ML LIQD nasal spray kit Place 1 spray into the nose once.  (Patient not taking: No sig reported)    . traMADol (ULTRAM) 50 MG tablet Take 1 tablet (50 mg total) by mouth every 6 (six) hours as needed for moderate pain. (Patient not taking: No sig reported) 30 tablet 0   No current facility-administered medications for this visit.    OBJECTIVE: BP (!) 115/91 (BP Location: Left Arm, Patient Position: Sitting)   Pulse 68   Temp (!) 97.5 F (36.4  C) (Tympanic)   Resp 18   Wt 120 lb (54.4 kg)   BMI 21.95 kg/m    Body mass index is 21.95 kg/m.    ECOG FS:2 - Symptomatic, <50% confined to bed  General: Thin, no acute distress.  Sitting in a wheelchair. Eyes: Pink conjunctiva, anicteric sclera. HEENT: Normocephalic, moist mucous membranes. Lungs: No audible wheezing or coughing. Heart: Regular rate and rhythm. Abdomen: Soft, nontender, no obvious distention. Musculoskeletal: No edema, cyanosis, or clubbing. Neuro: Alert. Cranial nerves grossly intact. Skin: No rashes or petechiae noted. Psych: Normal affect.    LAB RESULTS:  Appointment on 12/11/2020  Component Date Value Ref Range Status  . WBC 12/11/2020 24.5* 4.0 - 10.5 K/uL Final   WHITE COUNT CONFIRMED ON SMEAR  . RBC 12/11/2020 3.69* 3.87 - 5.11 MIL/uL Final  . Hemoglobin 12/11/2020 11.0* 12.0 - 15.0 g/dL Final  . HCT 12/11/2020 34.0* 36.0 - 46.0 % Final  . MCV 12/11/2020 92.1  80.0 - 100.0 fL Final  . MCH 12/11/2020 29.8  26.0 - 34.0 pg Final  . MCHC 12/11/2020 32.4  30.0 - 36.0 g/dL Final  . RDW 12/11/2020 19.5* 11.5 - 15.5 % Final  . Platelets 12/11/2020 116* 150 - 400 K/uL Final   SPECIMEN CHECKED FOR CLOTS  . nRBC 12/11/2020 0.1  0.0 - 0.2 % Final  . Neutrophils Relative % 12/11/2020 83  % Final  . Neutro Abs 12/11/2020 20.3* 1.7 - 7.7 K/uL Final  . Lymphocytes Relative 12/11/2020 9  % Final  . Lymphs Abs 12/11/2020 2.2  0.7 - 4.0 K/uL Final  . Monocytes Relative 12/11/2020 5  % Final  . Monocytes Absolute 12/11/2020 1.2* 0.1 - 1.0 K/uL Final  . Eosinophils Relative 12/11/2020 2  % Final  . Eosinophils Absolute 12/11/2020 0.5  0.0 - 0.5 K/uL Final  . Basophils Relative 12/11/2020 0  % Final  . Basophils Absolute 12/11/2020 0.1  0.0 - 0.1 K/uL Final  . WBC Morphology 12/11/2020 DIFF. CONFIRMED BY SMEAR   Final  . RBC Morphology 12/11/2020 MORPHOLOGY UNREMARKABLE   Final  . Smear Review 12/11/2020 PLATELETS APPEAR DECREASED   Final   Comment: PLATELET  COUNT CONFIRMED BY  SMEAR PLT COUNT CONFIRMED ON CITRATE TUBE LARGE AND GIANT PLTS SEEN   . Immature Granulocytes 12/11/2020 1  % Final  . Abs Immature Granulocytes 12/11/2020 0.28* 0.00 - 0.07 K/uL Final  . Giant PLTs 12/11/2020 PRESENT   Final   Performed at [facility], [address] [place], [place] [address]    STUDIES: No results found.  ASSESSMENT:  Chronic refractory ITP  PLAN:   1. Chronic refractory ITP: Patient had a poor response to Prednisone, WinRho, and Rituxan. She is status post splenectomy in 2007.  She received IVIG and Nplate while in the hospital with significant improvement of her platelet count.   If Nplate no longer was effective, could retry IVIG at a future date.  Patient is currently receiving the max dose of Nplate at 10 mcg/kg.  Can also consider Promacta 12.5mg  or Tavalisse 100 mg BID if needed.  Patient received Nplate last week with improvement of her platelets of 116.  She does not require additional treatment today.  Return to clinic in 3 weeks for further evaluation and continuation of treatment.  2. Weakness and fatigue: Mildly improved. 3.  Depression/grief: Improved.  Significantly improved. 4.  Poor appetite/weight loss: Resolved.  5.  Dementia: Appears slightly worse today.  Patient is now at [employer]. 6.  Leukocytosis: Possibly reactive.  Patient's white blood cell count is 24.5 with a neutrophil 7.  Anemia: Mild, monitor.   8.  Fractured hip: Continue follow-up with orthopedics as scheduled.   Patient expressed understanding and was in agreement with this plan. She also understands that She can call clinic at any time with any questions, concerns, or complaints.    Lloyd Huger, MD   12/13/2020 5:56 AM

## 2020-12-09 DIAGNOSIS — G309 Alzheimer's disease, unspecified: Secondary | ICD-10-CM | POA: Diagnosis not present

## 2020-12-09 DIAGNOSIS — F419 Anxiety disorder, unspecified: Secondary | ICD-10-CM | POA: Diagnosis not present

## 2020-12-09 DIAGNOSIS — K449 Diaphragmatic hernia without obstruction or gangrene: Secondary | ICD-10-CM | POA: Diagnosis not present

## 2020-12-09 DIAGNOSIS — F0280 Dementia in other diseases classified elsewhere without behavioral disturbance: Secondary | ICD-10-CM | POA: Diagnosis not present

## 2020-12-09 DIAGNOSIS — S72041D Displaced fracture of base of neck of right femur, subsequent encounter for closed fracture with routine healing: Secondary | ICD-10-CM | POA: Diagnosis not present

## 2020-12-09 DIAGNOSIS — D649 Anemia, unspecified: Secondary | ICD-10-CM | POA: Diagnosis not present

## 2020-12-09 DIAGNOSIS — F32A Depression, unspecified: Secondary | ICD-10-CM | POA: Diagnosis not present

## 2020-12-09 DIAGNOSIS — J45909 Unspecified asthma, uncomplicated: Secondary | ICD-10-CM | POA: Diagnosis not present

## 2020-12-09 DIAGNOSIS — I10 Essential (primary) hypertension: Secondary | ICD-10-CM | POA: Diagnosis not present

## 2020-12-11 ENCOUNTER — Inpatient Hospital Stay: Payer: Medicare HMO | Attending: Oncology

## 2020-12-11 ENCOUNTER — Inpatient Hospital Stay: Payer: Medicare HMO

## 2020-12-11 ENCOUNTER — Encounter: Payer: Self-pay | Admitting: Oncology

## 2020-12-11 ENCOUNTER — Other Ambulatory Visit: Payer: Self-pay

## 2020-12-11 ENCOUNTER — Inpatient Hospital Stay: Payer: Medicare HMO | Admitting: Oncology

## 2020-12-11 VITALS — BP 115/91 | HR 68 | Temp 97.5°F | Resp 18 | Wt 120.0 lb

## 2020-12-11 DIAGNOSIS — D693 Immune thrombocytopenic purpura: Secondary | ICD-10-CM | POA: Insufficient documentation

## 2020-12-11 DIAGNOSIS — D649 Anemia, unspecified: Secondary | ICD-10-CM | POA: Insufficient documentation

## 2020-12-11 LAB — CBC WITH DIFFERENTIAL/PLATELET
Abs Immature Granulocytes: 0.28 10*3/uL — ABNORMAL HIGH (ref 0.00–0.07)
Basophils Absolute: 0.1 10*3/uL (ref 0.0–0.1)
Basophils Relative: 0 %
Eosinophils Absolute: 0.5 10*3/uL (ref 0.0–0.5)
Eosinophils Relative: 2 %
HCT: 34 % — ABNORMAL LOW (ref 36.0–46.0)
Hemoglobin: 11 g/dL — ABNORMAL LOW (ref 12.0–15.0)
Immature Granulocytes: 1 %
Lymphocytes Relative: 9 %
Lymphs Abs: 2.2 10*3/uL (ref 0.7–4.0)
MCH: 29.8 pg (ref 26.0–34.0)
MCHC: 32.4 g/dL (ref 30.0–36.0)
MCV: 92.1 fL (ref 80.0–100.0)
Monocytes Absolute: 1.2 10*3/uL — ABNORMAL HIGH (ref 0.1–1.0)
Monocytes Relative: 5 %
Neutro Abs: 20.3 10*3/uL — ABNORMAL HIGH (ref 1.7–7.7)
Neutrophils Relative %: 83 %
Platelets: 116 10*3/uL — ABNORMAL LOW (ref 150–400)
RBC: 3.69 MIL/uL — ABNORMAL LOW (ref 3.87–5.11)
RDW: 19.5 % — ABNORMAL HIGH (ref 11.5–15.5)
Smear Review: DECREASED
WBC: 24.5 10*3/uL — ABNORMAL HIGH (ref 4.0–10.5)
nRBC: 0.1 % (ref 0.0–0.2)

## 2020-12-11 NOTE — Progress Notes (Signed)
Pt in for follow up, reports very tired all the time and poor appetite.

## 2020-12-16 DIAGNOSIS — S72041D Displaced fracture of base of neck of right femur, subsequent encounter for closed fracture with routine healing: Secondary | ICD-10-CM | POA: Diagnosis not present

## 2020-12-16 DIAGNOSIS — F0280 Dementia in other diseases classified elsewhere without behavioral disturbance: Secondary | ICD-10-CM | POA: Diagnosis not present

## 2020-12-16 DIAGNOSIS — I10 Essential (primary) hypertension: Secondary | ICD-10-CM | POA: Diagnosis not present

## 2020-12-16 DIAGNOSIS — F419 Anxiety disorder, unspecified: Secondary | ICD-10-CM | POA: Diagnosis not present

## 2020-12-16 DIAGNOSIS — G309 Alzheimer's disease, unspecified: Secondary | ICD-10-CM | POA: Diagnosis not present

## 2020-12-16 DIAGNOSIS — D649 Anemia, unspecified: Secondary | ICD-10-CM | POA: Diagnosis not present

## 2020-12-16 DIAGNOSIS — K449 Diaphragmatic hernia without obstruction or gangrene: Secondary | ICD-10-CM | POA: Diagnosis not present

## 2020-12-16 DIAGNOSIS — F32A Depression, unspecified: Secondary | ICD-10-CM | POA: Diagnosis not present

## 2020-12-16 DIAGNOSIS — J45909 Unspecified asthma, uncomplicated: Secondary | ICD-10-CM | POA: Diagnosis not present

## 2020-12-18 DIAGNOSIS — J45909 Unspecified asthma, uncomplicated: Secondary | ICD-10-CM | POA: Diagnosis not present

## 2020-12-18 DIAGNOSIS — G309 Alzheimer's disease, unspecified: Secondary | ICD-10-CM | POA: Diagnosis not present

## 2020-12-18 DIAGNOSIS — K449 Diaphragmatic hernia without obstruction or gangrene: Secondary | ICD-10-CM | POA: Diagnosis not present

## 2020-12-18 DIAGNOSIS — I10 Essential (primary) hypertension: Secondary | ICD-10-CM | POA: Diagnosis not present

## 2020-12-18 DIAGNOSIS — S72041D Displaced fracture of base of neck of right femur, subsequent encounter for closed fracture with routine healing: Secondary | ICD-10-CM | POA: Diagnosis not present

## 2020-12-18 DIAGNOSIS — D649 Anemia, unspecified: Secondary | ICD-10-CM | POA: Diagnosis not present

## 2020-12-18 DIAGNOSIS — F419 Anxiety disorder, unspecified: Secondary | ICD-10-CM | POA: Diagnosis not present

## 2020-12-18 DIAGNOSIS — F0280 Dementia in other diseases classified elsewhere without behavioral disturbance: Secondary | ICD-10-CM | POA: Diagnosis not present

## 2020-12-18 DIAGNOSIS — F32A Depression, unspecified: Secondary | ICD-10-CM | POA: Diagnosis not present

## 2020-12-20 DIAGNOSIS — F0280 Dementia in other diseases classified elsewhere without behavioral disturbance: Secondary | ICD-10-CM | POA: Diagnosis not present

## 2020-12-20 DIAGNOSIS — G309 Alzheimer's disease, unspecified: Secondary | ICD-10-CM | POA: Diagnosis not present

## 2020-12-20 DIAGNOSIS — D649 Anemia, unspecified: Secondary | ICD-10-CM | POA: Diagnosis not present

## 2020-12-20 DIAGNOSIS — F419 Anxiety disorder, unspecified: Secondary | ICD-10-CM | POA: Diagnosis not present

## 2020-12-20 DIAGNOSIS — F32A Depression, unspecified: Secondary | ICD-10-CM | POA: Diagnosis not present

## 2020-12-20 DIAGNOSIS — I10 Essential (primary) hypertension: Secondary | ICD-10-CM | POA: Diagnosis not present

## 2020-12-20 DIAGNOSIS — K449 Diaphragmatic hernia without obstruction or gangrene: Secondary | ICD-10-CM | POA: Diagnosis not present

## 2020-12-20 DIAGNOSIS — J45909 Unspecified asthma, uncomplicated: Secondary | ICD-10-CM | POA: Diagnosis not present

## 2020-12-20 DIAGNOSIS — S72041D Displaced fracture of base of neck of right femur, subsequent encounter for closed fracture with routine healing: Secondary | ICD-10-CM | POA: Diagnosis not present

## 2020-12-25 DIAGNOSIS — S72002D Fracture of unspecified part of neck of left femur, subsequent encounter for closed fracture with routine healing: Secondary | ICD-10-CM | POA: Diagnosis not present

## 2020-12-26 DIAGNOSIS — J45909 Unspecified asthma, uncomplicated: Secondary | ICD-10-CM | POA: Diagnosis not present

## 2020-12-26 DIAGNOSIS — F419 Anxiety disorder, unspecified: Secondary | ICD-10-CM | POA: Diagnosis not present

## 2020-12-26 DIAGNOSIS — F0280 Dementia in other diseases classified elsewhere without behavioral disturbance: Secondary | ICD-10-CM | POA: Diagnosis not present

## 2020-12-26 DIAGNOSIS — S72041D Displaced fracture of base of neck of right femur, subsequent encounter for closed fracture with routine healing: Secondary | ICD-10-CM | POA: Diagnosis not present

## 2020-12-26 DIAGNOSIS — D649 Anemia, unspecified: Secondary | ICD-10-CM | POA: Diagnosis not present

## 2020-12-26 DIAGNOSIS — G309 Alzheimer's disease, unspecified: Secondary | ICD-10-CM | POA: Diagnosis not present

## 2020-12-26 DIAGNOSIS — F32A Depression, unspecified: Secondary | ICD-10-CM | POA: Diagnosis not present

## 2020-12-26 DIAGNOSIS — K449 Diaphragmatic hernia without obstruction or gangrene: Secondary | ICD-10-CM | POA: Diagnosis not present

## 2020-12-26 DIAGNOSIS — I10 Essential (primary) hypertension: Secondary | ICD-10-CM | POA: Diagnosis not present

## 2020-12-27 NOTE — Progress Notes (Signed)
[facility] [place]  Telephone:(336) [phone]  Fax:(336) [phone]     Ariana [name] DOB: 11-Jun-1933  MR#: [id number]  AOZ#:[id number]  Patient Care Team: Idelle Crouch, MD as PCP - General (Internal Medicine) Lloyd Huger, MD as Consulting Physician (Oncology)   CHIEF COMPLAINT: Chronic refractory ITP  INTERVAL HISTORY: Patient returns to clinic today for repeat laboratory work, further evaluation, and continuation of Nplate.  She continues to have weakness and fatigue and decreased mobility, but has now completed physical therapy.  She continues to have chronic confusion.  She denies any easy bleeding or bruising.  She has no neurologic complaints.  She denies any recent fevers or illnesses. She denies any chest pain, shortness of breath, cough, or hemoptysis.  She denies any nausea, vomiting, constipation, or diarrhea.  She has no melena or hematochezia.  She has no urinary complaints.  Patient offers no further specific complaints today.  REVIEW OF SYSTEMS:   Review of Systems  Constitutional: Positive for malaise/fatigue. Negative for fever and weight loss.  HENT: Negative for congestion.   Respiratory: Negative.  Negative for cough, hemoptysis and shortness of breath.   Cardiovascular: Negative.  Negative for chest pain and leg swelling.  Gastrointestinal: Negative.  Negative for abdominal pain, blood in stool, diarrhea, melena and nausea.  Genitourinary: Negative.  Negative for dysuria and hematuria.  Musculoskeletal: Positive for joint pain. Negative for back pain and falls.  Skin: Negative.  Negative for rash.  Neurological: Positive for weakness. Negative for dizziness, sensory change, focal weakness and headaches.  Endo/Heme/Allergies: Does not bruise/bleed easily.  Psychiatric/Behavioral: Positive for memory loss. Negative for depression. The patient is not nervous/anxious.     As per HPI. Otherwise, a complete review of systems is  negative.  PAST MEDICAL HISTORY: Past Medical History:  Diagnosis Date  . Alzheimer disease ([place])   . Anemia   . Anxiety   . Asthma    WELL CONTROLLED  . Chronic back pain   . Depression   . Depression   . GERD (gastroesophageal reflux disease)   . Hiatal hernia   . Hypercholesteremia   . Hypothyroidism   . ITP (idiopathic thrombocytopenic purpura)    FOLLOWED BR DR Grayland Ormond  . LBBB (left bundle branch block)   . Osteoarthritis   . Osteoporosis   . Restless legs     PAST SURGICAL HISTORY: Past Surgical History:  Procedure Laterality Date  . CARPAL TUNNEL RELEASE    . ESOPHAGOGASTRODUODENOSCOPY (EGD) WITH PROPOFOL N/A 09/10/2015   Procedure: ESOPHAGOGASTRODUODENOSCOPY (EGD) WITH PROPOFOL;  Surgeon: Lollie Sails, MD;  Location: [facility] ENDOSCOPY;  Service: Endoscopy;  Laterality: N/A;  . HIP PINNING,CANNULATED Left 10/31/2020   Procedure: CANNULATED HIP PINNING;  Surgeon: Leim Fabry, MD;  Location: [facility] ORS;  Service: Orthopedics;  Laterality: Left;  . IRRIGATION AND DEBRIDEMENT HEMATOMA Right 07/13/2018   Procedure: IRRIGATION AND DEBRIDEMENT HEMATOMA-RIGHT SHIN;  Surgeon: Herbert Pun, MD;  Location: [facility] ORS;  Service: General;  Laterality: Right;  . KYPHOPLASTY N/A 05/02/2019   Procedure: L1 KYPHOPLASTY;  Surgeon: Hessie Knows, MD;  Location: [facility] ORS;  Service: Orthopedics;  Laterality: N/A;  . LUMBAR [place]    . REVERSE SHOULDER ARTHROPLASTY Left 06/25/2020   Procedure: REVERSE SHOULDER ARTHROPLASTY;  Surgeon: Corky Mull, MD;  Location: [facility] ORS;  Service: Orthopedics;  Laterality: Left;  . SPLENECTOMY, PARTIAL    . TOTAL ABDOMINAL HYSTERECTOMY      FAMILY HISTORY Family History  Problem Relation Age of Onset  .  Stroke Mother     GYNECOLOGIC HISTORY:  No LMP recorded. Patient has had a hysterectomy.     ADVANCED DIRECTIVES:    HEALTH MAINTENANCE: Social History   Tobacco Use  . Smoking status: Never Smoker  . Smokeless tobacco: Never  Used  Vaping Use  . Vaping Use: Never used  Substance Use Topics  . Alcohol use: No    Alcohol/week: 0.0 standard drinks  . Drug use: No     Allergies  Allergen Reactions  . Aspirin     Upset stomach  . Diazepam Itching    sneezing  . Other     seasonal allergies  . Tetanus Toxoids     Localized superficial swelling of skin  . Morphine Itching and Rash    Current Outpatient Medications  Medication Sig Dispense Refill  . acetaminophen (TYLENOL) 325 MG tablet Take 2 tablets (650 mg total) by mouth every 6 (six) hours as needed for mild pain (or Fever >/= 101). 100 tablet 0  . albuterol (PROVENTIL HFA;VENTOLIN HFA) 108 (90 BASE) MCG/ACT inhaler Inhale 2 puffs into the lungs every 6 (six) hours as needed for wheezing or shortness of breath.    . ALPRAZolam (XANAX) 0.5 MG tablet Take 1 tablet (0.5 mg total) by mouth 3 (three) times daily as needed for anxiety. 20 tablet 0  . benzonatate (TESSALON) 200 MG capsule Take 200 mg by mouth 3 (three) times daily.    Marland Kitchen buPROPion (WELLBUTRIN XL) 150 MG 24 hr tablet Take 150 mg by mouth every morning.     . busPIRone (BUSPAR) 15 MG tablet Take 15 mg by mouth 2 (two) times daily.     . Calcium Carbonate-Vit D-Min (CALCIUM 600+D PLUS MINERALS) 600-400 MG-UNIT TABS Take 1 tablet by mouth daily.     . Carboxymethylcellul-Glycerin (LUBRICATING EYE DROPS OP) Place 1 drop into both eyes daily as needed (dry eyes).    . Cholecalciferol (VITAMIN D) 50 MCG (2000 UT) tablet Take 2,000 Units by mouth daily.     . citalopram (CELEXA) 40 MG tablet Take 40 mg by mouth every morning.     . Cyanocobalamin 1000 MCG TBCR Take 1,000 mcg by mouth daily.     Marland Kitchen donepezil (ARICEPT) 10 MG tablet Take 10 mg by mouth 2 (two) times daily.    . feeding supplement, ENSURE ENLIVE, (ENSURE ENLIVE) LIQD Take 237 mLs by mouth 2 (two) times daily between meals. (Patient taking differently: Take 237 mLs by mouth daily with breakfast.) 237 mL 12  . fluticasone (FLONASE) 50 MCG/ACT  nasal spray Place 2 sprays into both nostrils daily as needed for allergies.     . Fluticasone-Salmeterol (ADVAIR) 250-50 MCG/DOSE AEPB Inhale 1 puff into the lungs 2 (two) times daily as needed (shortness of breath).     . gabapentin (NEURONTIN) 300 MG capsule Take 300 mg by mouth 4 (four) times daily.    Marland Kitchen lamoTRIgine (LAMICTAL) 25 MG tablet Take 50 mg by mouth at bedtime.    Marland Kitchen levothyroxine (SYNTHROID, LEVOTHROID) 50 MCG tablet Take 50 mcg by mouth daily before breakfast. Take 30 to 60 minutes before breakfast.    . memantine (NAMENDA) 10 MG tablet Take 10 mg by mouth 2 (two) times daily.     . montelukast (SINGULAIR) 10 MG tablet Take 10 mg by mouth at bedtime.     . Multiple Vitamin (MULTI-VITAMIN) tablet Take 1 tablet by mouth daily.     . Multiple Vitamins-Minerals (PRESERVISION AREDS 2) CAPS Take 1 capsule by  mouth 2 (two) times a day.    Marland Kitchen NARCAN 4 MG/0.1ML LIQD nasal spray kit Place 1 spray into the nose once.    Marland Kitchen oxybutynin (DITROPAN) 5 MG tablet Take 5 mg by mouth 3 (three) times daily.    Marland Kitchen oxyCODONE-acetaminophen (PERCOCET) 10-325 MG tablet Take 1 tablet by mouth every 6 (six) hours as needed for moderate pain or severe pain.    . pantoprazole (PROTONIX) 20 MG tablet Take 20 mg by mouth 2 (two) times daily.     . pravastatin (PRAVACHOL) 20 MG tablet Take 20 mg by mouth at bedtime.     . traMADol (ULTRAM) 50 MG tablet Take 1 tablet (50 mg total) by mouth every 6 (six) hours as needed for moderate pain. 30 tablet 0  . vitamin C (ASCORBIC ACID) 500 MG tablet Take 500 mg by mouth daily.    . vitamin E 180 MG (400 UNITS) capsule Take 400 Units by mouth daily.    Marland Kitchen amLODipine (NORVASC) 5 MG tablet Take 1 tablet (5 mg total) by mouth daily. 30 tablet 0   No current facility-administered medications for this visit.   Facility-Administered Medications Ordered in Other Visits  Medication Dose Route Frequency Provider Last Rate Last Admin  . romiPLOStim (NPLATE) injection 565 mcg  10  mcg/kg Subcutaneous Once Lloyd Huger, MD        OBJECTIVE: BP (!) 111/58   Pulse 65   Temp 99.2 F (37.3 C) (Tympanic)   Resp 18   Wt 124 lb 6.4 oz (56.4 kg)   SpO2 94%   BMI 22.75 kg/m    Body mass index is 22.75 kg/m.    ECOG FS:2 - Symptomatic, <50% confined to bed  General: Thin, no acute distress.  Sitting in a wheelchair. Eyes: Pink conjunctiva, anicteric sclera. HEENT: Normocephalic, moist mucous membranes. Lungs: No audible wheezing or coughing. Heart: Regular rate and rhythm. Abdomen: Soft, nontender, no obvious distention. Musculoskeletal: No edema, cyanosis, or clubbing. Neuro: Alert, answering all questions appropriately. Cranial nerves grossly intact. Skin: No rashes or petechiae noted. Psych: Normal affect.   LAB RESULTS:  No visits with results within 3 Day(s) from this visit.  Latest known visit with results is:  Appointment on 12/11/2020  Component Date Value Ref Range Status  . WBC 12/11/2020 24.5* 4.0 - 10.5 K/uL Final   WHITE COUNT CONFIRMED ON SMEAR  . RBC 12/11/2020 3.69* 3.87 - 5.11 MIL/uL Final  . Hemoglobin 12/11/2020 11.0* 12.0 - 15.0 g/dL Final  . HCT 12/11/2020 34.0* 36.0 - 46.0 % Final  . MCV 12/11/2020 92.1  80.0 - 100.0 fL Final  . MCH 12/11/2020 29.8  26.0 - 34.0 pg Final  . MCHC 12/11/2020 32.4  30.0 - 36.0 g/dL Final  . RDW 12/11/2020 19.5* 11.5 - 15.5 % Final  . Platelets 12/11/2020 116* 150 - 400 K/uL Final   SPECIMEN CHECKED FOR CLOTS  . nRBC 12/11/2020 0.1  0.0 - 0.2 % Final  . Neutrophils Relative % 12/11/2020 83  % Final  . Neutro Abs 12/11/2020 20.3* 1.7 - 7.7 K/uL Final  . Lymphocytes Relative 12/11/2020 9  % Final  . Lymphs Abs 12/11/2020 2.2  0.7 - 4.0 K/uL Final  . Monocytes Relative 12/11/2020 5  % Final  . Monocytes Absolute 12/11/2020 1.2* 0.1 - 1.0 K/uL Final  . Eosinophils Relative 12/11/2020 2  % Final  . Eosinophils Absolute 12/11/2020 0.5  0.0 - 0.5 K/uL Final  . Basophils Relative 12/11/2020 0  % Final   .  Basophils Absolute 12/11/2020 0.1  0.0 - 0.1 K/uL Final  . WBC Morphology 12/11/2020 DIFF. CONFIRMED BY SMEAR   Final  . RBC Morphology 12/11/2020 MORPHOLOGY UNREMARKABLE   Final  . Smear Review 12/11/2020 PLATELETS APPEAR DECREASED   Final   Comment: PLATELET COUNT CONFIRMED BY SMEAR PLT COUNT CONFIRMED ON CITRATE TUBE LARGE AND GIANT PLTS SEEN   . Immature Granulocytes 12/11/2020 1  % Final  . Abs Immature Granulocytes 12/11/2020 0.28* 0.00 - 0.07 K/uL Final  . Giant PLTs 12/11/2020 PRESENT   Final   Performed at [facility], [address] [place],  [address]    STUDIES: No results found.  ASSESSMENT:  Chronic refractory ITP  PLAN:   1. Chronic refractory ITP: Patient had a poor response to Prednisone, WinRho, and Rituxan. She is status post splenectomy in 2007.  She received IVIG and Nplate while in the hospital with significant improvement of her platelet count.   If Nplate no longer was effective, could retry IVIG at a future date.  Patient is currently receiving the max dose of Nplate at 10 mcg/kg.  Can also consider Promacta 12.5mg  or Tavalisse 100 mg BID if needed.  Patient's platelet count was 14, therefore will proceed with Nplate today.  Return to clinic in 4 weeks for further evaluation and continuation of treatment.   2. Weakness and fatigue: Chronic and unchanged.  Recommended patient continue exercises provided by physical therapy. 3.  Depression/grief: Resolved. 4.  Poor appetite/weight loss: Resolved.  5.  Dementia: Chronic and unchanged. 6.  Leukocytosis: Improved. 7.  Anemia: Chronic and unchanged. 8.  Fractured hip: Continue follow-up with orthopedics as scheduled.  Continue exercises provided by physical therapy as above. 9.  Low blood pressure: Encourage increased fluid intake.   Patient expressed understanding and was in agreement with this plan. She also understands that She can call clinic at any time with any questions, concerns, or  complaints.    Lloyd Huger, MD   01/01/2021 10:57 AM

## 2020-12-30 DIAGNOSIS — S72041D Displaced fracture of base of neck of right femur, subsequent encounter for closed fracture with routine healing: Secondary | ICD-10-CM | POA: Diagnosis not present

## 2020-12-30 DIAGNOSIS — F0280 Dementia in other diseases classified elsewhere without behavioral disturbance: Secondary | ICD-10-CM | POA: Diagnosis not present

## 2020-12-30 DIAGNOSIS — K449 Diaphragmatic hernia without obstruction or gangrene: Secondary | ICD-10-CM | POA: Diagnosis not present

## 2020-12-30 DIAGNOSIS — G309 Alzheimer's disease, unspecified: Secondary | ICD-10-CM | POA: Diagnosis not present

## 2020-12-30 DIAGNOSIS — I10 Essential (primary) hypertension: Secondary | ICD-10-CM | POA: Diagnosis not present

## 2020-12-30 DIAGNOSIS — D649 Anemia, unspecified: Secondary | ICD-10-CM | POA: Diagnosis not present

## 2020-12-30 DIAGNOSIS — F32A Depression, unspecified: Secondary | ICD-10-CM | POA: Diagnosis not present

## 2020-12-30 DIAGNOSIS — J45909 Unspecified asthma, uncomplicated: Secondary | ICD-10-CM | POA: Diagnosis not present

## 2020-12-30 DIAGNOSIS — F419 Anxiety disorder, unspecified: Secondary | ICD-10-CM | POA: Diagnosis not present

## 2021-01-01 ENCOUNTER — Other Ambulatory Visit: Payer: Self-pay

## 2021-01-01 ENCOUNTER — Inpatient Hospital Stay: Payer: Medicare HMO

## 2021-01-01 ENCOUNTER — Inpatient Hospital Stay: Payer: Medicare HMO | Admitting: Oncology

## 2021-01-01 VITALS — BP 111/58 | HR 65 | Temp 99.2°F | Resp 18 | Wt 124.4 lb

## 2021-01-01 DIAGNOSIS — D649 Anemia, unspecified: Secondary | ICD-10-CM | POA: Diagnosis not present

## 2021-01-01 DIAGNOSIS — J45909 Unspecified asthma, uncomplicated: Secondary | ICD-10-CM | POA: Diagnosis not present

## 2021-01-01 DIAGNOSIS — F0280 Dementia in other diseases classified elsewhere without behavioral disturbance: Secondary | ICD-10-CM | POA: Diagnosis not present

## 2021-01-01 DIAGNOSIS — S72041D Displaced fracture of base of neck of right femur, subsequent encounter for closed fracture with routine healing: Secondary | ICD-10-CM | POA: Diagnosis not present

## 2021-01-01 DIAGNOSIS — D693 Immune thrombocytopenic purpura: Secondary | ICD-10-CM

## 2021-01-01 DIAGNOSIS — F419 Anxiety disorder, unspecified: Secondary | ICD-10-CM | POA: Diagnosis not present

## 2021-01-01 DIAGNOSIS — G309 Alzheimer's disease, unspecified: Secondary | ICD-10-CM | POA: Diagnosis not present

## 2021-01-01 DIAGNOSIS — F32A Depression, unspecified: Secondary | ICD-10-CM | POA: Diagnosis not present

## 2021-01-01 DIAGNOSIS — K449 Diaphragmatic hernia without obstruction or gangrene: Secondary | ICD-10-CM | POA: Diagnosis not present

## 2021-01-01 DIAGNOSIS — I10 Essential (primary) hypertension: Secondary | ICD-10-CM | POA: Diagnosis not present

## 2021-01-01 LAB — CBC WITH DIFFERENTIAL/PLATELET
Abs Immature Granulocytes: 0.09 10*3/uL — ABNORMAL HIGH (ref 0.00–0.07)
Basophils Absolute: 0.1 10*3/uL (ref 0.0–0.1)
Basophils Relative: 1 %
Eosinophils Absolute: 0.7 10*3/uL — ABNORMAL HIGH (ref 0.0–0.5)
Eosinophils Relative: 6 %
HCT: 34.5 % — ABNORMAL LOW (ref 36.0–46.0)
Hemoglobin: 11 g/dL — ABNORMAL LOW (ref 12.0–15.0)
Immature Granulocytes: 1 %
Lymphocytes Relative: 20 %
Lymphs Abs: 2.5 10*3/uL (ref 0.7–4.0)
MCH: 28.9 pg (ref 26.0–34.0)
MCHC: 31.9 g/dL (ref 30.0–36.0)
MCV: 90.8 fL (ref 80.0–100.0)
Monocytes Absolute: 1.4 10*3/uL — ABNORMAL HIGH (ref 0.1–1.0)
Monocytes Relative: 11 %
Neutro Abs: 7.7 10*3/uL (ref 1.7–7.7)
Neutrophils Relative %: 61 %
Platelets: 14 10*3/uL — CL (ref 150–400)
RBC: 3.8 MIL/uL — ABNORMAL LOW (ref 3.87–5.11)
RDW: 17.8 % — ABNORMAL HIGH (ref 11.5–15.5)
WBC: 12.5 10*3/uL — ABNORMAL HIGH (ref 4.0–10.5)
nRBC: 0 % (ref 0.0–0.2)

## 2021-01-01 MED ORDER — ROMIPLOSTIM INJECTION 500 MCG
10.0000 ug/kg | Freq: Once | SUBCUTANEOUS | Status: AC
  Administered 2021-01-01: 565 ug via SUBCUTANEOUS
  Filled 2021-01-01: qty 1.13

## 2021-01-02 DIAGNOSIS — R053 Chronic cough: Secondary | ICD-10-CM | POA: Diagnosis not present

## 2021-01-02 DIAGNOSIS — E079 Disorder of thyroid, unspecified: Secondary | ICD-10-CM | POA: Diagnosis not present

## 2021-01-02 DIAGNOSIS — G8929 Other chronic pain: Secondary | ICD-10-CM | POA: Diagnosis not present

## 2021-01-02 DIAGNOSIS — E785 Hyperlipidemia, unspecified: Secondary | ICD-10-CM | POA: Diagnosis not present

## 2021-01-02 DIAGNOSIS — Z0000 Encounter for general adult medical examination without abnormal findings: Secondary | ICD-10-CM | POA: Diagnosis not present

## 2021-01-02 DIAGNOSIS — I10 Essential (primary) hypertension: Secondary | ICD-10-CM | POA: Diagnosis not present

## 2021-01-02 DIAGNOSIS — Z79899 Other long term (current) drug therapy: Secondary | ICD-10-CM | POA: Diagnosis not present

## 2021-01-02 DIAGNOSIS — F0390 Unspecified dementia without behavioral disturbance: Secondary | ICD-10-CM | POA: Diagnosis not present

## 2021-01-02 DIAGNOSIS — R059 Cough, unspecified: Secondary | ICD-10-CM | POA: Diagnosis not present

## 2021-01-03 DIAGNOSIS — D696 Thrombocytopenia, unspecified: Secondary | ICD-10-CM | POA: Diagnosis not present

## 2021-01-05 ENCOUNTER — Inpatient Hospital Stay
Admission: EM | Admit: 2021-01-05 | Discharge: 2021-01-17 | DRG: 813 | Disposition: A | Discharge status: Skilled Nursing Facility | Payer: Medicare HMO | Attending: Internal Medicine | Admitting: Internal Medicine

## 2021-01-05 ENCOUNTER — Emergency Department: Payer: Medicare HMO

## 2021-01-05 DIAGNOSIS — D75839 Thrombocytosis, unspecified: Secondary | ICD-10-CM | POA: Diagnosis present

## 2021-01-05 DIAGNOSIS — E785 Hyperlipidemia, unspecified: Secondary | ICD-10-CM | POA: Diagnosis present

## 2021-01-05 DIAGNOSIS — D693 Immune thrombocytopenic purpura: Principal | ICD-10-CM | POA: Diagnosis present

## 2021-01-05 DIAGNOSIS — K219 Gastro-esophageal reflux disease without esophagitis: Secondary | ICD-10-CM | POA: Diagnosis present

## 2021-01-05 DIAGNOSIS — Z22322 Carrier or suspected carrier of Methicillin resistant Staphylococcus aureus: Secondary | ICD-10-CM

## 2021-01-05 DIAGNOSIS — J440 Chronic obstructive pulmonary disease with acute lower respiratory infection: Secondary | ICD-10-CM | POA: Diagnosis not present

## 2021-01-05 DIAGNOSIS — J441 Chronic obstructive pulmonary disease with (acute) exacerbation: Secondary | ICD-10-CM | POA: Diagnosis present

## 2021-01-05 DIAGNOSIS — F32A Depression, unspecified: Secondary | ICD-10-CM | POA: Diagnosis present

## 2021-01-05 DIAGNOSIS — J811 Chronic pulmonary edema: Secondary | ICD-10-CM | POA: Diagnosis not present

## 2021-01-05 DIAGNOSIS — S0003XA Contusion of scalp, initial encounter: Secondary | ICD-10-CM | POA: Diagnosis present

## 2021-01-05 DIAGNOSIS — T50Z15A Adverse effect of immunoglobulin, initial encounter: Secondary | ICD-10-CM | POA: Diagnosis not present

## 2021-01-05 DIAGNOSIS — Z9081 Acquired absence of spleen: Secondary | ICD-10-CM

## 2021-01-05 DIAGNOSIS — Z79899 Other long term (current) drug therapy: Secondary | ICD-10-CM

## 2021-01-05 DIAGNOSIS — Z79891 Long term (current) use of opiate analgesic: Secondary | ICD-10-CM

## 2021-01-05 DIAGNOSIS — E876 Hypokalemia: Secondary | ICD-10-CM | POA: Diagnosis present

## 2021-01-05 DIAGNOSIS — E611 Iron deficiency: Secondary | ICD-10-CM | POA: Diagnosis present

## 2021-01-05 DIAGNOSIS — Z79890 Hormone replacement therapy (postmenopausal): Secondary | ICD-10-CM

## 2021-01-05 DIAGNOSIS — Z66 Do not resuscitate: Secondary | ICD-10-CM | POA: Diagnosis not present

## 2021-01-05 DIAGNOSIS — Z20822 Contact with and (suspected) exposure to covid-19: Secondary | ICD-10-CM | POA: Diagnosis present

## 2021-01-05 DIAGNOSIS — Z886 Allergy status to analgesic agent status: Secondary | ICD-10-CM

## 2021-01-05 DIAGNOSIS — D696 Thrombocytopenia, unspecified: Secondary | ICD-10-CM | POA: Diagnosis present

## 2021-01-05 DIAGNOSIS — E039 Hypothyroidism, unspecified: Secondary | ICD-10-CM | POA: Diagnosis not present

## 2021-01-05 DIAGNOSIS — Z888 Allergy status to other drugs, medicaments and biological substances status: Secondary | ICD-10-CM

## 2021-01-05 DIAGNOSIS — I517 Cardiomegaly: Secondary | ICD-10-CM | POA: Diagnosis not present

## 2021-01-05 DIAGNOSIS — S0990XA Unspecified injury of head, initial encounter: Secondary | ICD-10-CM | POA: Diagnosis not present

## 2021-01-05 DIAGNOSIS — R41841 Cognitive communication deficit: Secondary | ICD-10-CM | POA: Diagnosis not present

## 2021-01-05 DIAGNOSIS — M810 Age-related osteoporosis without current pathological fracture: Secondary | ICD-10-CM | POA: Diagnosis present

## 2021-01-05 DIAGNOSIS — G4733 Obstructive sleep apnea (adult) (pediatric): Secondary | ICD-10-CM | POA: Diagnosis present

## 2021-01-05 DIAGNOSIS — G2581 Restless legs syndrome: Secondary | ICD-10-CM | POA: Diagnosis present

## 2021-01-05 DIAGNOSIS — J9811 Atelectasis: Secondary | ICD-10-CM | POA: Diagnosis not present

## 2021-01-05 DIAGNOSIS — I10 Essential (primary) hypertension: Secondary | ICD-10-CM | POA: Diagnosis present

## 2021-01-05 DIAGNOSIS — F419 Anxiety disorder, unspecified: Secondary | ICD-10-CM | POA: Diagnosis present

## 2021-01-05 DIAGNOSIS — Z6822 Body mass index (BMI) 22.0-22.9, adult: Secondary | ICD-10-CM

## 2021-01-05 DIAGNOSIS — T148XXA Other injury of unspecified body region, initial encounter: Secondary | ICD-10-CM

## 2021-01-05 DIAGNOSIS — F0281 Dementia in other diseases classified elsewhere with behavioral disturbance: Secondary | ICD-10-CM | POA: Diagnosis not present

## 2021-01-05 DIAGNOSIS — M25562 Pain in left knee: Secondary | ICD-10-CM | POA: Diagnosis not present

## 2021-01-05 DIAGNOSIS — J4521 Mild intermittent asthma with (acute) exacerbation: Secondary | ICD-10-CM | POA: Diagnosis not present

## 2021-01-05 DIAGNOSIS — I959 Hypotension, unspecified: Secondary | ICD-10-CM | POA: Diagnosis not present

## 2021-01-05 DIAGNOSIS — R059 Cough, unspecified: Secondary | ICD-10-CM | POA: Diagnosis not present

## 2021-01-05 DIAGNOSIS — W19XXXA Unspecified fall, initial encounter: Secondary | ICD-10-CM

## 2021-01-05 DIAGNOSIS — R2689 Other abnormalities of gait and mobility: Secondary | ICD-10-CM | POA: Diagnosis not present

## 2021-01-05 DIAGNOSIS — M6281 Muscle weakness (generalized): Secondary | ICD-10-CM | POA: Diagnosis not present

## 2021-01-05 DIAGNOSIS — Z0471 Encounter for examination and observation following alleged adult physical abuse: Secondary | ICD-10-CM | POA: Diagnosis not present

## 2021-01-05 DIAGNOSIS — R278 Other lack of coordination: Secondary | ICD-10-CM | POA: Diagnosis not present

## 2021-01-05 DIAGNOSIS — J209 Acute bronchitis, unspecified: Secondary | ICD-10-CM | POA: Diagnosis present

## 2021-01-05 DIAGNOSIS — S8011XA Contusion of right lower leg, initial encounter: Secondary | ICD-10-CM | POA: Diagnosis present

## 2021-01-05 DIAGNOSIS — Z515 Encounter for palliative care: Secondary | ICD-10-CM | POA: Diagnosis not present

## 2021-01-05 DIAGNOSIS — J45909 Unspecified asthma, uncomplicated: Secondary | ICD-10-CM | POA: Diagnosis present

## 2021-01-05 DIAGNOSIS — Z885 Allergy status to narcotic agent status: Secondary | ICD-10-CM

## 2021-01-05 DIAGNOSIS — E440 Moderate protein-calorie malnutrition: Secondary | ICD-10-CM | POA: Diagnosis not present

## 2021-01-05 DIAGNOSIS — R5381 Other malaise: Secondary | ICD-10-CM | POA: Diagnosis present

## 2021-01-05 DIAGNOSIS — I119 Hypertensive heart disease without heart failure: Secondary | ICD-10-CM | POA: Diagnosis present

## 2021-01-05 DIAGNOSIS — I447 Left bundle-branch block, unspecified: Secondary | ICD-10-CM | POA: Diagnosis not present

## 2021-01-05 DIAGNOSIS — G8929 Other chronic pain: Secondary | ICD-10-CM | POA: Diagnosis present

## 2021-01-05 DIAGNOSIS — T7491XA Unspecified adult maltreatment, confirmed, initial encounter: Secondary | ICD-10-CM | POA: Diagnosis not present

## 2021-01-05 DIAGNOSIS — Z90710 Acquired absence of both cervix and uterus: Secondary | ICD-10-CM

## 2021-01-05 DIAGNOSIS — G309 Alzheimer's disease, unspecified: Secondary | ICD-10-CM | POA: Diagnosis not present

## 2021-01-05 DIAGNOSIS — Z96612 Presence of left artificial shoulder joint: Secondary | ICD-10-CM | POA: Diagnosis present

## 2021-01-05 DIAGNOSIS — Z7951 Long term (current) use of inhaled steroids: Secondary | ICD-10-CM

## 2021-01-05 DIAGNOSIS — R296 Repeated falls: Secondary | ICD-10-CM | POA: Diagnosis present

## 2021-01-05 DIAGNOSIS — D649 Anemia, unspecified: Secondary | ICD-10-CM | POA: Diagnosis present

## 2021-01-05 LAB — CBC WITH DIFFERENTIAL/PLATELET
Abs Immature Granulocytes: 0.09 10*3/uL — ABNORMAL HIGH (ref 0.00–0.07)
Basophils Absolute: 0.1 10*3/uL (ref 0.0–0.1)
Basophils Relative: 1 %
Eosinophils Absolute: 0.5 10*3/uL (ref 0.0–0.5)
Eosinophils Relative: 3 %
HCT: 35.8 % — ABNORMAL LOW (ref 36.0–46.0)
Hemoglobin: 11.3 g/dL — ABNORMAL LOW (ref 12.0–15.0)
Immature Granulocytes: 1 %
Lymphocytes Relative: 24 %
Lymphs Abs: 3.5 10*3/uL (ref 0.7–4.0)
MCH: 28.5 pg (ref 26.0–34.0)
MCHC: 31.6 g/dL (ref 30.0–36.0)
MCV: 90.4 fL (ref 80.0–100.0)
Monocytes Absolute: 1.2 10*3/uL — ABNORMAL HIGH (ref 0.1–1.0)
Monocytes Relative: 9 %
Neutro Abs: 9 10*3/uL — ABNORMAL HIGH (ref 1.7–7.7)
Neutrophils Relative %: 62 %
Platelets: 23 10*3/uL — CL (ref 150–400)
RBC: 3.96 MIL/uL (ref 3.87–5.11)
RDW: 17.2 % — ABNORMAL HIGH (ref 11.5–15.5)
WBC: 14.4 10*3/uL — ABNORMAL HIGH (ref 4.0–10.5)
nRBC: 0 % (ref 0.0–0.2)

## 2021-01-05 LAB — COMPREHENSIVE METABOLIC PANEL
ALT: 19 U/L (ref 0–44)
AST: 36 U/L (ref 15–41)
Albumin: 3.7 g/dL (ref 3.5–5.0)
Alkaline Phosphatase: 87 U/L (ref 38–126)
Anion gap: 8 (ref 5–15)
BUN: 18 mg/dL (ref 8–23)
CO2: 26 mmol/L (ref 22–32)
Calcium: 8.7 mg/dL — ABNORMAL LOW (ref 8.9–10.3)
Chloride: 106 mmol/L (ref 98–111)
Creatinine, Ser: 0.96 mg/dL (ref 0.44–1.00)
GFR, Estimated: 58 mL/min — ABNORMAL LOW (ref >60)
Glucose, Bld: 86 mg/dL (ref 70–99)
Potassium: 3.8 mmol/L (ref 3.5–5.1)
Sodium: 140 mmol/L (ref 135–145)
Total Bilirubin: 0.7 mg/dL (ref 0.3–1.2)
Total Protein: 6.6 g/dL (ref 6.5–8.1)

## 2021-01-05 LAB — RESP PANEL BY RT-PCR (FLU A&B, COVID) ARPGX2
Influenza A by PCR: NEGATIVE
Influenza B by PCR: NEGATIVE
SARS Coronavirus 2 by RT PCR: NEGATIVE

## 2021-01-05 MED ORDER — IMMUNE GLOBULIN (HUMAN) 10 GM/100ML IV SOLN
1.0000 g/kg | INTRAVENOUS | Status: AC
  Administered 2021-01-06 (×5): 10 g via INTRAVENOUS
  Filled 2021-01-05: qty 550
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
## 2021-01-05 NOTE — ED Notes (Signed)
Patient transported to CT 
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
## 2021-01-05 NOTE — ED Notes (Signed)
ED Provider at bedside speaking with patient family about plan of care. Pt resting comfortably with no acute distress. Plan for social work to consult with patient tomorrow.

## 2021-01-06 ENCOUNTER — Other Ambulatory Visit: Payer: Self-pay

## 2021-01-06 DIAGNOSIS — D696 Thrombocytopenia, unspecified: Secondary | ICD-10-CM | POA: Diagnosis not present

## 2021-01-06 LAB — URINALYSIS, COMPLETE (UACMP) WITH MICROSCOPIC
Bacteria, UA: NONE SEEN
Bilirubin Urine: NEGATIVE
Glucose, UA: NEGATIVE mg/dL
Hgb urine dipstick: NEGATIVE
Ketones, ur: NEGATIVE mg/dL
Leukocytes,Ua: NEGATIVE
Nitrite: NEGATIVE
Protein, ur: NEGATIVE mg/dL
Specific Gravity, Urine: 1.008 (ref 1.005–1.030)
Squamous Epithelial / HPF: NONE SEEN (ref 0–5)
pH: 6 (ref 5.0–8.0)

## 2021-01-06 LAB — COMPREHENSIVE METABOLIC PANEL
ALT: 16 U/L (ref 0–44)
AST: 30 U/L (ref 15–41)
Albumin: 3 g/dL — ABNORMAL LOW (ref 3.5–5.0)
Alkaline Phosphatase: 83 U/L (ref 38–126)
Anion gap: 8 (ref 5–15)
BUN: 14 mg/dL (ref 8–23)
CO2: 22 mmol/L (ref 22–32)
Calcium: 8.5 mg/dL — ABNORMAL LOW (ref 8.9–10.3)
Chloride: 108 mmol/L (ref 98–111)
Creatinine, Ser: 0.81 mg/dL (ref 0.44–1.00)
GFR, Estimated: >60 mL/min (ref >60)
Glucose, Bld: 84 mg/dL (ref 70–99)
Potassium: 3.6 mmol/L (ref 3.5–5.1)
Sodium: 138 mmol/L (ref 135–145)
Total Bilirubin: 0.7 mg/dL (ref 0.3–1.2)
Total Protein: 6.7 g/dL (ref 6.5–8.1)

## 2021-01-06 LAB — CBC
HCT: 32 % — ABNORMAL LOW (ref 36.0–46.0)
Hemoglobin: 10.1 g/dL — ABNORMAL LOW (ref 12.0–15.0)
MCH: 28.7 pg (ref 26.0–34.0)
MCHC: 31.6 g/dL (ref 30.0–36.0)
MCV: 90.9 fL (ref 80.0–100.0)
Platelets: 39 10*3/uL — ABNORMAL LOW (ref 150–400)
RBC: 3.52 MIL/uL — ABNORMAL LOW (ref 3.87–5.11)
RDW: 17.5 % — ABNORMAL HIGH (ref 11.5–15.5)
WBC: 12.4 10*3/uL — ABNORMAL HIGH (ref 4.0–10.5)
nRBC: 0 % (ref 0.0–0.2)

## 2021-01-06 LAB — MRSA PCR SCREENING: MRSA by PCR: POSITIVE — AB

## 2021-01-06 MED ORDER — POLYVINYL ALCOHOL 1.4 % OP SOLN
1.0000 [drp] | Freq: Every day | OPHTHALMIC | Status: DC | PRN
  Filled 2021-01-06: qty 15

## 2021-01-06 MED ORDER — ALBUTEROL SULFATE HFA 108 (90 BASE) MCG/ACT IN AERS
2.0000 | INHALATION_SPRAY | Freq: Four times a day (QID) | RESPIRATORY_TRACT | Status: DC | PRN
  Filled 2021-01-06: qty 6.7

## 2021-01-06 MED ORDER — ACETAMINOPHEN 325 MG PO TABS
650.0000 mg | ORAL_TABLET | Freq: Four times a day (QID) | ORAL | Status: DC | PRN
  Administered 2021-01-09: 650 mg via ORAL
  Filled 2021-01-06: qty 2

## 2021-01-06 MED ORDER — CHLORHEXIDINE GLUCONATE CLOTH 2 % EX PADS
6.0000 | MEDICATED_PAD | Freq: Every day | CUTANEOUS | Status: AC
  Administered 2021-01-07 – 2021-01-09 (×2): 6 via TOPICAL

## 2021-01-06 MED ORDER — OCUVITE-LUTEIN PO CAPS
1.0000 | ORAL_CAPSULE | Freq: Two times a day (BID) | ORAL | Status: DC
  Administered 2021-01-06 – 2021-01-17 (×18): 1 via ORAL
  Filled 2021-01-06 (×24): qty 1

## 2021-01-06 MED ORDER — ASCORBIC ACID 500 MG PO TABS
500.0000 mg | ORAL_TABLET | Freq: Every day | ORAL | Status: DC
  Administered 2021-01-06 – 2021-01-17 (×11): 500 mg via ORAL
  Filled 2021-01-06 (×11): qty 1

## 2021-01-06 MED ORDER — CITALOPRAM HYDROBROMIDE 20 MG PO TABS
40.0000 mg | ORAL_TABLET | ORAL | Status: DC
  Administered 2021-01-06 – 2021-01-17 (×12): 40 mg via ORAL
  Filled 2021-01-06 (×13): qty 2

## 2021-01-06 MED ORDER — ONDANSETRON HCL 4 MG PO TABS: 4.0000 mg | ORAL_TABLET | Freq: Four times a day (QID) | ORAL | Status: DC | PRN

## 2021-01-06 MED ORDER — OXYCODONE-ACETAMINOPHEN 10-325 MG PO TABS
1.0000 | ORAL_TABLET | Freq: Four times a day (QID) | ORAL | Status: DC | PRN
Start: 2021-01-06 — End: 2021-01-06

## 2021-01-06 MED ORDER — PRAVASTATIN SODIUM 20 MG PO TABS
20.0000 mg | ORAL_TABLET | Freq: Every day | ORAL | Status: DC
  Administered 2021-01-06 – 2021-01-15 (×9): 20 mg via ORAL
  Filled 2021-01-06 (×10): qty 1

## 2021-01-06 MED ORDER — CALCIUM CARBONATE-VITAMIN D 500-200 MG-UNIT PO TABS
1.0000 | ORAL_TABLET | Freq: Every day | ORAL | Status: DC
  Administered 2021-01-06 – 2021-01-17 (×10): 1 via ORAL
  Filled 2021-01-06 (×11): qty 1

## 2021-01-06 MED ORDER — ACETAMINOPHEN 650 MG RE SUPP: 650.0000 mg | Freq: Four times a day (QID) | RECTAL | Status: DC | PRN

## 2021-01-06 MED ORDER — DONEPEZIL HCL 5 MG PO TABS
10.0000 mg | ORAL_TABLET | Freq: Every day | ORAL | Status: DC
  Administered 2021-01-06 – 2021-01-16 (×10): 10 mg via ORAL
  Filled 2021-01-06 (×10): qty 2

## 2021-01-06 MED ORDER — MOMETASONE FURO-FORMOTEROL FUM 200-5 MCG/ACT IN AERO
2.0000 | INHALATION_SPRAY | Freq: Two times a day (BID) | RESPIRATORY_TRACT | Status: DC
  Administered 2021-01-06 – 2021-01-17 (×17): 2 via RESPIRATORY_TRACT
  Filled 2021-01-06: qty 8.8

## 2021-01-06 MED ORDER — MONTELUKAST SODIUM 10 MG PO TABS
10.0000 mg | ORAL_TABLET | Freq: Every day | ORAL | Status: DC
  Administered 2021-01-06 – 2021-01-16 (×10): 10 mg via ORAL
  Filled 2021-01-06 (×10): qty 1

## 2021-01-06 MED ORDER — MUPIROCIN 2 % EX OINT
1.0000 "application " | TOPICAL_OINTMENT | Freq: Two times a day (BID) | CUTANEOUS | Status: AC
  Administered 2021-01-06 – 2021-01-09 (×6): 1 via NASAL
  Filled 2021-01-06: qty 22

## 2021-01-06 MED ORDER — DEXTROSE IN LACTATED RINGERS 5 % IV SOLN: INTRAVENOUS | Status: DC

## 2021-01-06 MED ORDER — ADULT MULTIVITAMIN W/MINERALS CH
1.0000 | ORAL_TABLET | Freq: Every day | ORAL | Status: DC
  Administered 2021-01-06 – 2021-01-17 (×9): 1 via ORAL
  Filled 2021-01-06 (×10): qty 1

## 2021-01-06 MED ORDER — ENSURE ENLIVE PO LIQD
237.0000 mL | Freq: Two times a day (BID) | ORAL | Status: DC
  Administered 2021-01-07 – 2021-01-13 (×8): 237 mL via ORAL

## 2021-01-06 MED ORDER — LEVOTHYROXINE SODIUM 50 MCG PO TABS
50.0000 ug | ORAL_TABLET | Freq: Every day | ORAL | Status: DC
  Administered 2021-01-06 – 2021-01-17 (×10): 50 ug via ORAL
  Filled 2021-01-06 (×11): qty 1

## 2021-01-06 MED ORDER — OXYCODONE HCL 5 MG PO TABS
10.0000 mg | ORAL_TABLET | Freq: Four times a day (QID) | ORAL | Status: DC | PRN
  Administered 2021-01-06 – 2021-01-08 (×7): 10 mg via ORAL
  Filled 2021-01-06 (×7): qty 2

## 2021-01-06 MED ORDER — BENZONATATE 100 MG PO CAPS
200.0000 mg | ORAL_CAPSULE | Freq: Three times a day (TID) | ORAL | Status: DC
  Administered 2021-01-06 – 2021-01-17 (×31): 200 mg via ORAL
  Filled 2021-01-06 (×32): qty 2

## 2021-01-06 MED ORDER — ACETAMINOPHEN 325 MG PO TABS
325.0000 mg | ORAL_TABLET | Freq: Four times a day (QID) | ORAL | Status: DC | PRN
  Administered 2021-01-06 – 2021-01-10 (×3): 325 mg via ORAL
  Filled 2021-01-06 (×3): qty 1

## 2021-01-06 MED ORDER — AMLODIPINE BESYLATE 5 MG PO TABS
5.0000 mg | ORAL_TABLET | Freq: Every day | ORAL | Status: DC
  Administered 2021-01-06 – 2021-01-07 (×2): 5 mg via ORAL
  Filled 2021-01-06 (×2): qty 1

## 2021-01-06 MED ORDER — MEMANTINE HCL 5 MG PO TABS
10.0000 mg | ORAL_TABLET | Freq: Two times a day (BID) | ORAL | Status: DC
  Administered 2021-01-06 – 2021-01-17 (×21): 10 mg via ORAL
  Filled 2021-01-06 (×22): qty 2

## 2021-01-06 MED ORDER — ONDANSETRON HCL 4 MG/2ML IJ SOLN: 4.0000 mg | Freq: Four times a day (QID) | INTRAMUSCULAR | Status: DC | PRN

## 2021-01-06 MED ORDER — ALPRAZOLAM 0.5 MG PO TABS
0.5000 mg | ORAL_TABLET | Freq: Three times a day (TID) | ORAL | Status: DC | PRN
  Administered 2021-01-06 – 2021-01-17 (×14): 0.5 mg via ORAL
  Filled 2021-01-06 (×17): qty 1

## 2021-01-06 MED ORDER — PANTOPRAZOLE SODIUM 20 MG PO TBEC
20.0000 mg | DELAYED_RELEASE_TABLET | Freq: Two times a day (BID) | ORAL | Status: DC
  Administered 2021-01-06 – 2021-01-09 (×8): 20 mg via ORAL
  Filled 2021-01-06 (×13): qty 1

## 2021-01-06 MED ORDER — VITAMIN E 180 MG (400 UNIT) PO CAPS
400.0000 [IU] | ORAL_CAPSULE | Freq: Every day | ORAL | Status: DC
  Administered 2021-01-06 – 2021-01-17 (×10): 400 [IU] via ORAL
  Filled 2021-01-06: qty 1
  Filled 2021-01-06: qty 4
  Filled 2021-01-06 (×2): qty 1
  Filled 2021-01-06 (×2): qty 4
  Filled 2021-01-06 (×4): qty 1
  Filled 2021-01-06 (×3): qty 4
  Filled 2021-01-06: qty 1

## 2021-01-06 MED ORDER — FLUTICASONE PROPIONATE 50 MCG/ACT NA SUSP
2.0000 | Freq: Every day | NASAL | Status: DC | PRN
  Filled 2021-01-06: qty 16

## 2021-01-06 MED ORDER — BUPROPION HCL ER (XL) 150 MG PO TB24
150.0000 mg | ORAL_TABLET | ORAL | Status: DC
  Administered 2021-01-06 – 2021-01-11 (×6): 150 mg via ORAL
  Filled 2021-01-06 (×7): qty 1

## 2021-01-06 MED ORDER — LAMOTRIGINE 25 MG PO TABS
50.0000 mg | ORAL_TABLET | Freq: Every day | ORAL | Status: DC
  Administered 2021-01-06 – 2021-01-15 (×9): 50 mg via ORAL
  Filled 2021-01-06 (×10): qty 2

## 2021-01-06 MED ORDER — ELDERTONIC PO LIQD: 10.0000 mL | Freq: Every day | ORAL | Status: DC

## 2021-01-06 MED ORDER — CYANOCOBALAMIN 500 MCG PO TABS
1000.0000 ug | ORAL_TABLET | Freq: Every day | ORAL | Status: DC
  Administered 2021-01-06 – 2021-01-08 (×3): 1000 ug via ORAL
  Filled 2021-01-06 (×3): qty 2

## 2021-01-06 MED ORDER — ACETAMINOPHEN 325 MG PO TABS: 650.0000 mg | ORAL_TABLET | Freq: Four times a day (QID) | ORAL | Status: DC | PRN

## 2021-01-06 MED ORDER — VITAMIN D 25 MCG (1000 UNIT) PO TABS
2000.0000 [IU] | ORAL_TABLET | Freq: Every day | ORAL | Status: DC
  Administered 2021-01-06 – 2021-01-17 (×11): 2000 [IU] via ORAL
  Filled 2021-01-06 (×12): qty 2

## 2021-01-06 MED ORDER — GABAPENTIN 300 MG PO CAPS
300.0000 mg | ORAL_CAPSULE | Freq: Four times a day (QID) | ORAL | Status: DC
  Administered 2021-01-06 – 2021-01-17 (×40): 300 mg via ORAL
  Filled 2021-01-06 (×40): qty 1

## 2021-01-06 MED ORDER — ENSURE ENLIVE PO LIQD: 237.0000 mL | Freq: Every day | ORAL | Status: DC

## 2021-01-06 MED ORDER — BUSPIRONE HCL 10 MG PO TABS
15.0000 mg | ORAL_TABLET | Freq: Two times a day (BID) | ORAL | Status: DC
  Administered 2021-01-06 – 2021-01-15 (×18): 15 mg via ORAL
  Filled 2021-01-06 (×10): qty 2
  Filled 2021-01-06: qty 3
  Filled 2021-01-06 (×7): qty 2

## 2021-01-06 MED ORDER — OXYBUTYNIN CHLORIDE 5 MG PO TABS
5.0000 mg | ORAL_TABLET | Freq: Three times a day (TID) | ORAL | Status: DC
  Administered 2021-01-06 – 2021-01-17 (×30): 5 mg via ORAL
  Filled 2021-01-06 (×38): qty 1

## 2021-01-06 MED ORDER — NALOXONE HCL 4 MG/0.1ML NA LIQD: 1.0000 | Freq: Once | NASAL | Status: DC

## 2021-01-06 NOTE — Progress Notes (Signed)
Pt admitted to room 155. VSS and are as follows. Pt oriented to room.   01/06/21 0840  Vitals  Temp 99 F (37.2 C)  BP 134/79  MAP (mmHg) 96  BP Location Left Arm  BP Method Automatic  Patient Position (if appropriate) Lying  Pulse Rate 66  Pulse Rate Source Monitor  Resp 18  MEWS COLOR  MEWS Score Color Green  Oxygen Therapy  SpO2 95 %  O2 Device Room [employer]

## 2021-01-06 NOTE — Progress Notes (Signed)
MRSA PCR is positive. Sharion Settler, NP notified.
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
## 2021-01-06 NOTE — Plan of Care (Signed)
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
## 2021-01-06 NOTE — ED Notes (Signed)
Sent msg to Dr [place] Crane who is listed as attending re: code status. Pt told night nurse she does not want to be DNR. Sent handoff report to floor nurse, waiting for response. Pt resting in bed with no needs expressed at this time.
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
## 2021-01-07 ENCOUNTER — Inpatient Hospital Stay: Payer: Medicare HMO

## 2021-01-07 DIAGNOSIS — Z515 Encounter for palliative care: Secondary | ICD-10-CM | POA: Diagnosis not present

## 2021-01-07 DIAGNOSIS — D696 Thrombocytopenia, unspecified: Secondary | ICD-10-CM | POA: Diagnosis not present

## 2021-01-07 LAB — CBC
HCT: 29.9 % — ABNORMAL LOW (ref 36.0–46.0)
HCT: 30.8 % — ABNORMAL LOW (ref 36.0–46.0)
Hemoglobin: 9.4 g/dL — ABNORMAL LOW (ref 12.0–15.0)
Hemoglobin: 9.5 g/dL — ABNORMAL LOW (ref 12.0–15.0)
MCH: 28.4 pg (ref 26.0–34.0)
MCH: 28.4 pg (ref 26.0–34.0)
MCHC: 31.4 g/dL (ref 30.0–36.0)
MCHC: 30.8 g/dL (ref 30.0–36.0)
MCV: 90.3 fL (ref 80.0–100.0)
MCV: 92.2 fL (ref 80.0–100.0)
Platelets: 171 10*3/uL (ref 150–400)
Platelets: 239 10*3/uL (ref 150–400)
RBC: 3.31 MIL/uL — ABNORMAL LOW (ref 3.87–5.11)
RBC: 3.34 MIL/uL — ABNORMAL LOW (ref 3.87–5.11)
RDW: 18.1 % — ABNORMAL HIGH (ref 11.5–15.5)
RDW: 17.8 % — ABNORMAL HIGH (ref 11.5–15.5)
WBC: 9.4 10*3/uL (ref 4.0–10.5)
WBC: 10.6 10*3/uL — ABNORMAL HIGH (ref 4.0–10.5)
nRBC: 0 % (ref 0.0–0.2)
nRBC: 0 % (ref 0.0–0.2)

## 2021-01-07 LAB — GLUCOSE, CAPILLARY: Glucose-Capillary: 95 mg/dL (ref 70–99)

## 2021-01-07 MED ORDER — ENOXAPARIN SODIUM 40 MG/0.4ML ~~LOC~~ SOLN
40.0000 mg | SUBCUTANEOUS | Status: DC
  Administered 2021-01-07 – 2021-01-17 (×10): 40 mg via SUBCUTANEOUS
  Filled 2021-01-07 (×10): qty 0.4

## 2021-01-07 MED ORDER — IMMUNE GLOBULIN (HUMAN) 10 GM/100ML IV SOLN
1.0000 g/kg | INTRAVENOUS | Status: AC
  Administered 2021-01-07: 55 g via INTRAVENOUS
  Filled 2021-01-07 (×2): qty 550

## 2021-01-07 MED ORDER — DOXYCYCLINE HYCLATE 100 MG PO TABS
100.0000 mg | ORAL_TABLET | Freq: Two times a day (BID) | ORAL | Status: AC
  Administered 2021-01-07 – 2021-01-11 (×9): 100 mg via ORAL
  Filled 2021-01-07 (×9): qty 1

## 2021-01-07 NOTE — TOC Progression Note (Addendum)
Transition of Care [facility]) - Progression Note    Patient Details  Name: Kamaria Lucia MRN: [id number] Date of Birth: 1933/01/30  Transition of Care [facility]) CM/SW [place], RN Phone Number: 01/07/2021, 4:24 PM  Clinical Narrative:  Addendum 9150:  APS Caseworker number [phone], office number [phone]. TOC received call from [facility] today, Detective Velta Addison [phone] stated that spouse should be restricted from visiting patient.  Care team and security was advised.  Care team advised front desk.  APS Caseworker Delexstine Lynann Beaver, at bedside, requesting documentation that patient is not competent to make decisions.  MD wrote Progress Note to document this, given to caseworker.  Caseworker stated that POA will be revoked from both spouse and neighbor, will notify Case Manager and Care Team in the AM.  [facility] will follow through discharge, current recommendation is for SNF, will follow up with POA upon designation.       Expected Discharge Plan:  (TBD) Barriers to Discharge: Continued Medical Work up  Expected Discharge Plan and Services Expected Discharge Plan:  (TBD) In-house Referral: Chaplain   Post Acute Care Choice:  (TBD) Living arrangements for the past 2 months: Single Family Home                 DME Arranged:  (na)         HH Arranged:  (TBD)           Social Determinants of Health (SDOH) Interventions    Readmission Risk Interventions No flowsheet data found.
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
## 2021-01-07 NOTE — Progress Notes (Signed)
   01/07/21 1545  Clinical Encounter Type  Visited With Patient  Visit Type Follow-up;Spiritual support  Referral From Nurse  Consult/Referral To Chaplain  Spiritual Encounters  Spiritual Needs Prayer;Emotional    01/07/21 1545  Clinical Encounter Type  Visited With Patient  Visit Type Follow-up;Spiritual support  Referral From Nurse  Consult/Referral To Chaplain  Spiritual Encounters  Spiritual Needs Prayer;Emotional  Ariana [name] visited Mrs. Ursua in room [id number]. Pt requested prayer. I provided spiritual support and prayer.

## 2021-01-07 NOTE — Progress Notes (Signed)
  Chaplain On-Call responded to Order Requisition reading "the patient is requesting prayer".  Chaplain provided spiritual and emotional support, and prayer with the patient.  Patient stated her appreciation for spiritual support.  Chaplain Pollyann Samples M.Div., [facility]
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
## 2021-01-08 DIAGNOSIS — D696 Thrombocytopenia, unspecified: Secondary | ICD-10-CM | POA: Diagnosis not present

## 2021-01-08 LAB — CBC
HCT: 30 % — ABNORMAL LOW (ref 36.0–46.0)
Hemoglobin: 9.6 g/dL — ABNORMAL LOW (ref 12.0–15.0)
MCH: 28.8 pg (ref 26.0–34.0)
MCHC: 32 g/dL (ref 30.0–36.0)
MCV: 90.1 fL (ref 80.0–100.0)
Platelets: 316 10*3/uL (ref 150–400)
RBC: 3.33 MIL/uL — ABNORMAL LOW (ref 3.87–5.11)
RDW: 18.5 % — ABNORMAL HIGH (ref 11.5–15.5)
WBC: 12.6 10*3/uL — ABNORMAL HIGH (ref 4.0–10.5)
nRBC: 0 % (ref 0.0–0.2)

## 2021-01-08 LAB — BASIC METABOLIC PANEL
Anion gap: 4 — ABNORMAL LOW (ref 5–15)
BUN: 25 mg/dL — ABNORMAL HIGH (ref 8–23)
CO2: 27 mmol/L (ref 22–32)
Calcium: 8.6 mg/dL — ABNORMAL LOW (ref 8.9–10.3)
Chloride: 103 mmol/L (ref 98–111)
Creatinine, Ser: 0.9 mg/dL (ref 0.44–1.00)
GFR, Estimated: >60 mL/min (ref >60)
Glucose, Bld: 89 mg/dL (ref 70–99)
Potassium: 4.4 mmol/L (ref 3.5–5.1)
Sodium: 134 mmol/L — ABNORMAL LOW (ref 135–145)

## 2021-01-08 LAB — IRON AND TIBC
Iron: 17 ug/dL — ABNORMAL LOW (ref 28–170)
Saturation Ratios: 7 % — ABNORMAL LOW (ref 10.4–31.8)
TIBC: 231 ug/dL — ABNORMAL LOW (ref 250–450)
UIBC: 214 ug/dL

## 2021-01-08 LAB — PROCALCITONIN: Procalcitonin: <0.1 ng/mL

## 2021-01-08 LAB — MAGNESIUM: Magnesium: 2 mg/dL (ref 1.7–2.4)

## 2021-01-08 LAB — FOLATE: Folate: 17.6 ng/mL (ref >5.9)

## 2021-01-08 LAB — VITAMIN B12: Vitamin B-12: 4451 pg/mL — ABNORMAL HIGH (ref 180–914)

## 2021-01-08 LAB — VITAMIN D 25 HYDROXY (VIT D DEFICIENCY, FRACTURES): Vit D, 25-Hydroxy: 61.74 ng/mL (ref 30–100)

## 2021-01-08 LAB — PHOSPHORUS: Phosphorus: 3.6 mg/dL (ref 2.5–4.6)

## 2021-01-08 MED ORDER — AMLODIPINE BESYLATE 5 MG PO TABS: 5.0000 mg | ORAL_TABLET | Freq: Every day | ORAL | Status: DC | PRN

## 2021-01-08 MED ORDER — GUAIFENESIN-DM 100-10 MG/5ML PO SYRP
5.0000 mL | ORAL_SOLUTION | ORAL | Status: DC | PRN
  Administered 2021-01-08 – 2021-01-09 (×2): 5 mL via ORAL
  Filled 2021-01-08 (×3): qty 5

## 2021-01-08 MED ORDER — POLYSACCHARIDE IRON COMPLEX 150 MG PO CAPS
150.0000 mg | ORAL_CAPSULE | Freq: Every day | ORAL | Status: DC
  Administered 2021-01-08 – 2021-01-17 (×7): 150 mg via ORAL
  Filled 2021-01-08 (×10): qty 1
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
## 2021-01-09 ENCOUNTER — Other Ambulatory Visit: Payer: Self-pay

## 2021-01-09 DIAGNOSIS — E440 Moderate protein-calorie malnutrition: Secondary | ICD-10-CM | POA: Insufficient documentation

## 2021-01-09 DIAGNOSIS — D696 Thrombocytopenia, unspecified: Secondary | ICD-10-CM | POA: Diagnosis not present

## 2021-01-09 LAB — CBC
HCT: 29 % — ABNORMAL LOW (ref 36.0–46.0)
Hemoglobin: 9.1 g/dL — ABNORMAL LOW (ref 12.0–15.0)
MCH: 28.4 pg (ref 26.0–34.0)
MCHC: 31.4 g/dL (ref 30.0–36.0)
MCV: 90.6 fL (ref 80.0–100.0)
Platelets: 428 10*3/uL — ABNORMAL HIGH (ref 150–400)
RBC: 3.2 MIL/uL — ABNORMAL LOW (ref 3.87–5.11)
RDW: 18 % — ABNORMAL HIGH (ref 11.5–15.5)
WBC: 16.5 10*3/uL — ABNORMAL HIGH (ref 4.0–10.5)
nRBC: 0.1 % (ref 0.0–0.2)

## 2021-01-09 MED ORDER — IPRATROPIUM-ALBUTEROL 0.5-2.5 (3) MG/3ML IN SOLN
3.0000 mL | Freq: Four times a day (QID) | RESPIRATORY_TRACT | Status: DC
  Administered 2021-01-09 (×2): 3 mL via RESPIRATORY_TRACT
  Filled 2021-01-09 (×3): qty 3

## 2021-01-09 MED ORDER — HALOPERIDOL LACTATE 5 MG/ML IJ SOLN
2.0000 mg | Freq: Four times a day (QID) | INTRAMUSCULAR | Status: AC | PRN
  Administered 2021-01-09: 2 mg via INTRAVENOUS
  Filled 2021-01-09: qty 1

## 2021-01-09 MED ORDER — AMOXICILLIN-POT CLAVULANATE ER 1000-62.5 MG PO TB12: 2.0000 | ORAL_TABLET | Freq: Two times a day (BID) | ORAL | Status: DC

## 2021-01-09 MED ORDER — LORATADINE 10 MG PO TABS
10.0000 mg | ORAL_TABLET | Freq: Every day | ORAL | Status: DC
  Administered 2021-01-10 – 2021-01-17 (×7): 10 mg via ORAL
  Filled 2021-01-09 (×7): qty 1
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
## 2021-01-09 NOTE — Plan of Care (Addendum)
2140: pt cursing and thinks she is a whole, wants to go home. Refused all PO medications. Not redirectable, pulling external cath. IV lines Provider notified. IV haldol order and given with effect. Mitts placed  2250: pt agreed to take her medication, she took some (see MAR). Problem: Pain Managment: Goal: General experience of comfort will improve Outcome: Progressing   Problem: Safety: Goal: Ability to remain free from injury will improve Outcome: Progressing   Problem: Skin Integrity: Goal: Risk for impaired skin integrity will decrease Outcome: Progressing

## 2021-01-10 DIAGNOSIS — D696 Thrombocytopenia, unspecified: Secondary | ICD-10-CM | POA: Diagnosis not present

## 2021-01-10 LAB — CBC WITH DIFFERENTIAL/PLATELET
Abs Immature Granulocytes: 0.1 10*3/uL — ABNORMAL HIGH (ref 0.00–0.07)
Basophils Absolute: 0.1 10*3/uL (ref 0.0–0.1)
Basophils Relative: 1 %
Eosinophils Absolute: 0.6 10*3/uL — ABNORMAL HIGH (ref 0.0–0.5)
Eosinophils Relative: 4 %
HCT: 33.2 % — ABNORMAL LOW (ref 36.0–46.0)
Hemoglobin: 10.5 g/dL — ABNORMAL LOW (ref 12.0–15.0)
Immature Granulocytes: 1 %
Lymphocytes Relative: 18 %
Lymphs Abs: 2.5 10*3/uL (ref 0.7–4.0)
MCH: 28.2 pg (ref 26.0–34.0)
MCHC: 31.6 g/dL (ref 30.0–36.0)
MCV: 89.2 fL (ref 80.0–100.0)
Monocytes Absolute: 1.6 10*3/uL — ABNORMAL HIGH (ref 0.1–1.0)
Monocytes Relative: 12 %
Neutro Abs: 9.3 10*3/uL — ABNORMAL HIGH (ref 1.7–7.7)
Neutrophils Relative %: 64 %
Platelets: 712 10*3/uL — ABNORMAL HIGH (ref 150–400)
RBC: 3.72 MIL/uL — ABNORMAL LOW (ref 3.87–5.11)
RDW: 18.7 % — ABNORMAL HIGH (ref 11.5–15.5)
Smear Review: NORMAL
WBC: 14.1 10*3/uL — ABNORMAL HIGH (ref 4.0–10.5)
nRBC: 0.1 % (ref 0.0–0.2)

## 2021-01-10 LAB — BASIC METABOLIC PANEL
Anion gap: 8 (ref 5–15)
BUN: 12 mg/dL (ref 8–23)
CO2: 26 mmol/L (ref 22–32)
Calcium: 8.9 mg/dL (ref 8.9–10.3)
Chloride: 107 mmol/L (ref 98–111)
Creatinine, Ser: 0.84 mg/dL (ref 0.44–1.00)
GFR, Estimated: >60 mL/min (ref >60)
Glucose, Bld: 86 mg/dL (ref 70–99)
Potassium: 3.5 mmol/L (ref 3.5–5.1)
Sodium: 141 mmol/L (ref 135–145)

## 2021-01-10 LAB — MAGNESIUM: Magnesium: 1.9 mg/dL (ref 1.7–2.4)

## 2021-01-10 LAB — PHOSPHORUS: Phosphorus: 3.5 mg/dL (ref 2.5–4.6)

## 2021-01-10 MED ORDER — IPRATROPIUM-ALBUTEROL 0.5-2.5 (3) MG/3ML IN SOLN: 3.0000 mL | RESPIRATORY_TRACT | Status: DC | PRN
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
## 2021-01-10 NOTE — Progress Notes (Signed)
Pt refused tx

## 2021-01-11 DIAGNOSIS — D696 Thrombocytopenia, unspecified: Secondary | ICD-10-CM | POA: Diagnosis not present

## 2021-01-11 LAB — BASIC METABOLIC PANEL
Anion gap: 7 (ref 5–15)
BUN: 12 mg/dL (ref 8–23)
CO2: 26 mmol/L (ref 22–32)
Calcium: 8.8 mg/dL — ABNORMAL LOW (ref 8.9–10.3)
Chloride: 107 mmol/L (ref 98–111)
Creatinine, Ser: 0.8 mg/dL (ref 0.44–1.00)
GFR, Estimated: >60 mL/min (ref >60)
Glucose, Bld: 86 mg/dL (ref 70–99)
Potassium: 3.5 mmol/L (ref 3.5–5.1)
Sodium: 140 mmol/L (ref 135–145)

## 2021-01-11 LAB — CBC WITH DIFFERENTIAL/PLATELET
Abs Immature Granulocytes: 0.07 10*3/uL (ref 0.00–0.07)
Basophils Absolute: 0.1 10*3/uL (ref 0.0–0.1)
Basophils Relative: 1 %
Eosinophils Absolute: 0.6 10*3/uL — ABNORMAL HIGH (ref 0.0–0.5)
Eosinophils Relative: 5 %
HCT: 33.6 % — ABNORMAL LOW (ref 36.0–46.0)
Hemoglobin: 10.8 g/dL — ABNORMAL LOW (ref 12.0–15.0)
Immature Granulocytes: 1 %
Lymphocytes Relative: 24 %
Lymphs Abs: 3 10*3/uL (ref 0.7–4.0)
MCH: 28.5 pg (ref 26.0–34.0)
MCHC: 32.1 g/dL (ref 30.0–36.0)
MCV: 88.7 fL (ref 80.0–100.0)
Monocytes Absolute: 1.4 10*3/uL — ABNORMAL HIGH (ref 0.1–1.0)
Monocytes Relative: 11 %
Neutro Abs: 7.4 10*3/uL (ref 1.7–7.7)
Neutrophils Relative %: 58 %
Platelets: 892 10*3/uL — ABNORMAL HIGH (ref 150–400)
RBC: 3.79 MIL/uL — ABNORMAL LOW (ref 3.87–5.11)
RDW: 18.6 % — ABNORMAL HIGH (ref 11.5–15.5)
Smear Review: NORMAL
WBC: 12.6 10*3/uL — ABNORMAL HIGH (ref 4.0–10.5)
nRBC: 0 % (ref 0.0–0.2)

## 2021-01-11 MED ORDER — HALOPERIDOL LACTATE 5 MG/ML IJ SOLN
2.0000 mg | Freq: Four times a day (QID) | INTRAMUSCULAR | Status: DC | PRN
  Administered 2021-01-11 – 2021-01-16 (×4): 2 mg via INTRAVENOUS
  Filled 2021-01-11 (×4): qty 1

## 2021-01-11 MED ORDER — QUETIAPINE FUMARATE 25 MG PO TABS
12.5000 mg | ORAL_TABLET | Freq: Four times a day (QID) | ORAL | Status: DC | PRN
  Administered 2021-01-11 – 2021-01-17 (×6): 12.5 mg via ORAL
  Filled 2021-01-11 (×6): qty 1

## 2021-01-11 NOTE — Progress Notes (Signed)
Follow requested from staff. Patient was extremely agitated, emotional at times asking to go home to her dog. Nursing staff very patient and gentle, with no results while I was present. Will follow up.
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
## 2021-01-11 NOTE — Progress Notes (Signed)
End of Shift Summary:  Date: 01/11/21 Shift: 0700-1500 Ambulatory: stand and pivot x1-2 to chair Significant Events: Please see previous notes from shift. Patient extremely agitated this morning with favorable results from 2mg  IM haldol. Patient agreeable to Seroquel PO later in shift (1330) for agitation. Patient frequently requests to go home to her two dogs.

## 2021-01-11 NOTE — Progress Notes (Signed)
Received pt from [place], [place], agree with shift assessment. Pt aox1, pulled out purewick and had thrown onto ground. NT changed pt. Pt refusing meds at this time as well.

## 2021-01-11 NOTE — Progress Notes (Signed)
Patient agitated; screaming out "let me out of this locked down place," and attempting to get out of bed at this time. Staff attempts to redirect patient are ineffective at this time. Patient refusing PO medications including Xanax at this time. Patient kicking and swinging hands at staff attempting to maintain patient safety. Patient pulling at IV saying "I'm going to pull this out and make you a big mess to deal with!"   Patient states "if my husband finds out I'm here, he'll get me, and he'll get you too!"  MD notified.  2mg  IV haldol ordered; given IM per verbal orders from Dr. Dwyane Dee.
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
## 2021-01-12 DIAGNOSIS — D696 Thrombocytopenia, unspecified: Secondary | ICD-10-CM | POA: Diagnosis not present

## 2021-01-12 LAB — CBC WITH DIFFERENTIAL/PLATELET
Abs Immature Granulocytes: 0.1 10*3/uL — ABNORMAL HIGH (ref 0.00–0.07)
Basophils Absolute: 0.1 10*3/uL (ref 0.0–0.1)
Basophils Relative: 1 %
Eosinophils Absolute: 0.2 10*3/uL (ref 0.0–0.5)
Eosinophils Relative: 1 %
HCT: 33.5 % — ABNORMAL LOW (ref 36.0–46.0)
Hemoglobin: 10.7 g/dL — ABNORMAL LOW (ref 12.0–15.0)
Immature Granulocytes: 1 %
Lymphocytes Relative: 16 %
Lymphs Abs: 2.3 10*3/uL (ref 0.7–4.0)
MCH: 28.3 pg (ref 26.0–34.0)
MCHC: 31.9 g/dL (ref 30.0–36.0)
MCV: 88.6 fL (ref 80.0–100.0)
Monocytes Absolute: 1.5 10*3/uL — ABNORMAL HIGH (ref 0.1–1.0)
Monocytes Relative: 10 %
Neutro Abs: 10.5 10*3/uL — ABNORMAL HIGH (ref 1.7–7.7)
Neutrophils Relative %: 71 %
Platelets: 1171 10*3/uL (ref 150–400)
RBC: 3.78 MIL/uL — ABNORMAL LOW (ref 3.87–5.11)
RDW: 18.3 % — ABNORMAL HIGH (ref 11.5–15.5)
Smear Review: INCREASED
WBC: 14.6 10*3/uL — ABNORMAL HIGH (ref 4.0–10.5)
nRBC: 0 % (ref 0.0–0.2)

## 2021-01-12 LAB — BASIC METABOLIC PANEL
Anion gap: 6 (ref 5–15)
BUN: 10 mg/dL (ref 8–23)
CO2: 23 mmol/L (ref 22–32)
Calcium: 9.1 mg/dL (ref 8.9–10.3)
Chloride: 108 mmol/L (ref 98–111)
Creatinine, Ser: 0.63 mg/dL (ref 0.44–1.00)
GFR, Estimated: >60 mL/min (ref >60)
Glucose, Bld: 92 mg/dL (ref 70–99)
Potassium: 3.7 mmol/L (ref 3.5–5.1)
Sodium: 137 mmol/L (ref 135–145)

## 2021-01-12 LAB — PHOSPHORUS: Phosphorus: 2.8 mg/dL (ref 2.5–4.6)

## 2021-01-12 LAB — MAGNESIUM: Magnesium: 1.6 mg/dL — ABNORMAL LOW (ref 1.7–2.4)

## 2021-01-12 MED ORDER — FAMOTIDINE 20 MG PO TABS
20.0000 mg | ORAL_TABLET | Freq: Every day | ORAL | Status: DC
  Administered 2021-01-12 – 2021-01-16 (×5): 20 mg via ORAL
  Filled 2021-01-12 (×5): qty 1

## 2021-01-12 MED ORDER — MAGNESIUM SULFATE 2 GM/50ML IV SOLN
2.0000 g | Freq: Once | INTRAVENOUS | Status: AC
  Administered 2021-01-12: 2 g via INTRAVENOUS
  Filled 2021-01-12: qty 50
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
## 2021-01-13 ENCOUNTER — Telehealth: Payer: Self-pay | Admitting: Internal Medicine

## 2021-01-13 ENCOUNTER — Inpatient Hospital Stay: Payer: Medicare HMO

## 2021-01-13 DIAGNOSIS — D696 Thrombocytopenia, unspecified: Secondary | ICD-10-CM | POA: Diagnosis not present

## 2021-01-13 LAB — BASIC METABOLIC PANEL
Anion gap: 6 (ref 5–15)
BUN: 11 mg/dL (ref 8–23)
CO2: 23 mmol/L (ref 22–32)
Calcium: 8.5 mg/dL — ABNORMAL LOW (ref 8.9–10.3)
Chloride: 107 mmol/L (ref 98–111)
Creatinine, Ser: 0.83 mg/dL (ref 0.44–1.00)
GFR, Estimated: >60 mL/min (ref >60)
Glucose, Bld: 95 mg/dL (ref 70–99)
Potassium: 3.2 mmol/L — ABNORMAL LOW (ref 3.5–5.1)
Sodium: 136 mmol/L (ref 135–145)

## 2021-01-13 LAB — CBC WITH DIFFERENTIAL/PLATELET
Abs Immature Granulocytes: 0.08 10*3/uL — ABNORMAL HIGH (ref 0.00–0.07)
Basophils Absolute: 0.1 10*3/uL (ref 0.0–0.1)
Basophils Relative: 1 %
Eosinophils Absolute: 0.2 10*3/uL (ref 0.0–0.5)
Eosinophils Relative: 2 %
HCT: 34.4 % — ABNORMAL LOW (ref 36.0–46.0)
Hemoglobin: 11.1 g/dL — ABNORMAL LOW (ref 12.0–15.0)
Immature Granulocytes: 1 %
Lymphocytes Relative: 22 %
Lymphs Abs: 3 10*3/uL (ref 0.7–4.0)
MCH: 28 pg (ref 26.0–34.0)
MCHC: 32.3 g/dL (ref 30.0–36.0)
MCV: 86.9 fL (ref 80.0–100.0)
Monocytes Absolute: 1.6 10*3/uL — ABNORMAL HIGH (ref 0.1–1.0)
Monocytes Relative: 12 %
Neutro Abs: 8.9 10*3/uL — ABNORMAL HIGH (ref 1.7–7.7)
Neutrophils Relative %: 62 %
Platelets: 1133 10*3/uL (ref 150–400)
RBC: 3.96 MIL/uL (ref 3.87–5.11)
RDW: 17.5 % — ABNORMAL HIGH (ref 11.5–15.5)
Smear Review: NORMAL
WBC: 13.9 10*3/uL — ABNORMAL HIGH (ref 4.0–10.5)
nRBC: 0 % (ref 0.0–0.2)

## 2021-01-13 LAB — PHOSPHORUS: Phosphorus: 4.1 mg/dL (ref 2.5–4.6)

## 2021-01-13 LAB — MAGNESIUM: Magnesium: 2.1 mg/dL (ref 1.7–2.4)

## 2021-01-13 MED ORDER — ENSURE ENLIVE PO LIQD
237.0000 mL | Freq: Three times a day (TID) | ORAL | Status: DC
  Administered 2021-01-14 – 2021-01-17 (×6): 237 mL via ORAL

## 2021-01-13 MED ORDER — POTASSIUM CHLORIDE 20 MEQ PO PACK
40.0000 meq | PACK | Freq: Once | ORAL | Status: DC
  Filled 2021-01-13: qty 2

## 2021-01-13 MED ORDER — AMLODIPINE BESYLATE 5 MG PO TABS
5.0000 mg | ORAL_TABLET | Freq: Every day | ORAL | Status: DC
  Administered 2021-01-13 – 2021-01-14 (×2): 5 mg via ORAL
  Filled 2021-01-13 (×2): qty 1
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
## 2021-01-13 NOTE — Progress Notes (Signed)
Notified MD Damita Dunnings of Labs= Platelets 1,133 and K+ 3.2

## 2021-01-13 NOTE — Telephone Encounter (Signed)
On 4/2- spoke to Dr.Kumar; thrombocytosis likely secondary to IVIG for ITP. Will HOLD off the consult. Dr.Kumar agrees to consult [place] if needed again.  GB

## 2021-01-13 NOTE — Progress Notes (Signed)
PT Cancellation Note  Patient Details Name: Ariana [name] MRN: [id number] DOB: 08/17/1933   Cancelled Treatment:    Reason Eval/Treat Not Completed: Medical issues which prohibited therapy;Fatigue/lethargy limiting ability to participate (Per chart review, plateletes elevated beyond safe limits per facility guidelines; per OT pt also lethargic to the degree that it would limit ability to participate in gross motor assessment. Will evaluate at later date/time once medically appropriate.)   Tata Timmins C 01/13/2021, 2:47 PM
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
## 2021-01-13 NOTE — NC FL2 (Signed)
[place] LEVEL OF CARE SCREENING TOOL     IDENTIFICATION  Patient Name: Ariana [name] Birthdate: 06/03/1933 Sex: female Admission Date (Current Location): 01/05/2021  [place] and [place] Number:  Engineering geologist and Address:  [facility], [address] [place], [place] [address]      Provider Number: [id number]  Attending Physician Name and Address:  Gwynne Edinger, MD  Relative Name and Phone Number:  [facility] Delexstine Lynann Beaver [phone]    Current Level of Care: Hospital Recommended Level of Care: [place] Prior Approval Number:    Date Approved/Denied:   PASRR Number: [id number]  Discharge Plan: SNF    Current Diagnoses: Patient Active Problem List   Diagnosis Date Noted  . Malnutrition of moderate degree 01/09/2021  . Scalp hematoma 01/05/2021  . Palliative care encounter   . Closed displaced fracture of left femoral neck ([place]) 10/30/2020  . Hiatal hernia   . GERD (gastroesophageal reflux disease)   . Rectal bleeding 08/18/2020  . Leukocytosis 08/18/2020  . Failure to thrive in adult 02/16/2020  . Thrombocytopenia ([place]) 02/15/2020  . Pneumonia 07/10/2019  . Laceration of left lower leg 04/10/2019  . Chronic pain disorder 01/11/2019  . OSA (obstructive sleep apnea) 09/28/2018  . Ulcer of lower limb, right, limited to breakdown of skin ([place]) 07/22/2018  . Traumatic hematoma of right lower leg 07/12/2018  . Asthma without status asthmaticus 11/25/2016  . Late onset Alzheimer's disease without behavioral disturbance ([place]) 12/30/2015  . Back muscle spasm 09/09/2015  . Clinical depression 04/12/2015  . Herpes zona 04/12/2015  . Acute ITP ([place]) 03/15/2015  . Chest pain 10/22/2014  . Breathlessness on exertion 10/22/2014  . Osteopenia 10/18/2014  . Benign essential hypertension 06/28/2014  . HLD (hyperlipidemia) 06/28/2014  . Idiopathic thrombocythemia ([place]) 06/28/2014  . Acquired  hypothyroidism 06/04/2014  . Depressive disorder 06/04/2014  . Bursitis, trochanteric 03/27/2014  . DDD (degenerative disc disease), lumbar 02/27/2014  . Neuritis or radiculitis due to rupture of lumbar intervertebral disc 02/27/2014  . Lumbar radiculitis 02/27/2014    Orientation RESPIRATION BLADDER Height & Weight     Self,Place  Normal Continent,Incontinent (incontinent at times) Weight: 56.4 kg Height:  5\' 2"  (157.5 cm)  BEHAVIORAL SYMPTOMS/MOOD NEUROLOGICAL BOWEL NUTRITION STATUS      Continent Diet (regular diet)  AMBULATORY STATUS COMMUNICATION OF NEEDS Skin   Extensive Assist Verbally Normal                       Personal Care Assistance Level of Assistance  Bathing,Dressing Bathing Assistance: Limited assistance   Dressing Assistance: Limited assistance     Functional Limitations Info             SPECIAL CARE FACTORS FREQUENCY  PT (By licensed PT)     PT Frequency: 5 days per week              Contractures Contractures Info: Not present    Additional Factors Info  Code Status,Allergies Code Status Info: DNR Allergies Info: Aspirin, Diazepam, Other, Tetanus Toxoids, Morphine           Current Medications (01/13/2021):  This is the current hospital active medication list Current Facility-Administered Medications  Medication Dose Route Frequency Provider Last Rate Last Admin  . acetaminophen (TYLENOL) tablet 650 mg  650 mg Oral Q6H PRN Elwyn Reach, MD   650 mg at 01/09/21 0931   Or  . acetaminophen (TYLENOL) suppository 650 mg  650  mg Rectal Q6H PRN Elwyn Reach, MD      . oxyCODONE (Oxy IR/ROXICODONE) immediate release tablet 10 mg  10 mg Oral Q6H PRN Renda Rolls, RPH   10 mg at 01/08/21 1212   And  . acetaminophen (TYLENOL) tablet 325 mg  325 mg Oral Q6H PRN Renda Rolls, RPH   325 mg at 01/10/21 1004  . albuterol (VENTOLIN HFA) 108 (90 Base) MCG/ACT inhaler 2 puff  2 puff Inhalation Q6H PRN Elwyn Reach, MD      .  ALPRAZolam Duanne Moron) tablet 0.5 mg  0.5 mg Oral TID PRN Elwyn Reach, MD   0.5 mg at 01/13/21 0937  . amLODipine (NORVASC) tablet 5 mg  5 mg Oral Daily Wouk, Ailene Rud, MD   5 mg at 01/13/21 1206  . ascorbic acid (VITAMIN C) tablet 500 mg  500 mg Oral Daily Elwyn Reach, MD   500 mg at 01/13/21 0929  . benzonatate (TESSALON) capsule 200 mg  200 mg Oral TID Gala Romney L, MD   200 mg at 01/13/21 0929  . busPIRone (BUSPAR) tablet 15 mg  15 mg Oral BID Elwyn Reach, MD   15 mg at 01/13/21 0929  . calcium-vitamin D (OSCAL WITH D) 500-200 MG-UNIT per tablet 1 tablet  1 tablet Oral Daily Elwyn Reach, MD   1 tablet at 01/10/21 1004  . cholecalciferol (VITAMIN D3) tablet 2,000 Units  2,000 Units Oral Daily Elwyn Reach, MD   2,000 Units at 01/13/21 0929  . citalopram (CELEXA) tablet 40 mg  40 mg Oral Ala Dach, MD   40 mg at 01/13/21 8841  . dextrose 5 % in lactated ringers infusion   Intravenous Continuous Elwyn Reach, MD 75 mL/hr at 01/12/21 2230 New Bag at 01/12/21 2230  . donepezil (ARICEPT) tablet 10 mg  10 mg Oral QHS Elwyn Reach, MD   10 mg at 01/12/21 2206  . enoxaparin (LOVENOX) injection 40 mg  40 mg Subcutaneous Q24H Hall, Carole N, DO   40 mg at 01/12/21 1722  . famotidine (PEPCID) tablet 20 mg  20 mg Oral QHS Val Riles, MD   20 mg at 01/12/21 2208  . feeding supplement (ENSURE ENLIVE / ENSURE PLUS) liquid 237 mL  237 mL Oral TID BM Wouk, Ailene Rud, MD      . fluticasone (FLONASE) 50 MCG/ACT nasal spray 2 spray  2 spray Each Nare Daily PRN Gala Romney L, MD      . gabapentin (NEURONTIN) capsule 300 mg  300 mg Oral QID Gala Romney L, MD   300 mg at 01/13/21 0929  . guaiFENesin-dextromethorphan (ROBITUSSIN DM) 100-10 MG/5ML syrup 5 mL  5 mL Oral Q4H PRN Val Riles, MD   5 mL at 01/09/21 0939  . haloperidol lactate (HALDOL) injection 2 mg  2 mg Intravenous Q6H PRN Val Riles, MD   2 mg at 01/11/21 1703  .  ipratropium-albuterol (DUONEB) 0.5-2.5 (3) MG/3ML nebulizer solution 3 mL  3 mL Nebulization Q4H PRN Val Riles, MD      . iron polysaccharides (NIFEREX) capsule 150 mg  150 mg Oral Daily Val Riles, MD   150 mg at 01/13/21 1206  . lamoTRIgine (LAMICTAL) tablet 50 mg  50 mg Oral QHS Elwyn Reach, MD   50 mg at 01/12/21 2209  . levothyroxine (SYNTHROID) tablet 50 mcg  50 mcg Oral QAC breakfast Elwyn Reach, MD   50 mcg at 01/13/21  3500  . loratadine (CLARITIN) tablet 10 mg  10 mg Oral Daily Val Riles, MD   10 mg at 01/13/21 0928  . memantine (NAMENDA) tablet 10 mg  10 mg Oral BID Elwyn Reach, MD   10 mg at 01/13/21 0929  . mometasone-formoterol (DULERA) 200-5 MCG/ACT inhaler 2 puff  2 puff Inhalation BID Elwyn Reach, MD   2 puff at 01/13/21 0928  . montelukast (SINGULAIR) tablet 10 mg  10 mg Oral QHS Elwyn Reach, MD   10 mg at 01/12/21 2208  . multivitamin with minerals tablet 1 tablet  1 tablet Oral Daily Elwyn Reach, MD   1 tablet at 01/13/21 1206  . multivitamin-lutein (OCUVITE-LUTEIN) capsule 1 capsule  1 capsule Oral BID Gala Romney L, MD   1 capsule at 01/13/21 1206  . naloxone (NARCAN) nasal spray 4 mg/0.1 mL  1 spray Nasal Once Gala Romney L, MD      . ondansetron (ZOFRAN) tablet 4 mg  4 mg Oral Q6H PRN Elwyn Reach, MD       Or  . ondansetron (ZOFRAN) injection 4 mg  4 mg Intravenous Q6H PRN Gala Romney L, MD      . oxybutynin (DITROPAN) tablet 5 mg  5 mg Oral TID Elwyn Reach, MD   5 mg at 01/13/21 0930  . polyvinyl alcohol (LIQUIFILM TEARS) 1.4 % ophthalmic solution 1 drop  1 drop Both Eyes Daily PRN Gala Romney L, MD      . pravastatin (PRAVACHOL) tablet 20 mg  20 mg Oral QHS Elwyn Reach, MD   20 mg at 01/12/21 2207  . QUEtiapine (SEROQUEL) tablet 12.5 mg  12.5 mg Oral Q6H PRN Larita Fife, MD   12.5 mg at 01/12/21 2212  . Vitamin E CAPS 400 Units  400 Units Oral Daily Elwyn Reach, MD   400 Units at  01/13/21 0930     Discharge Medications: Please see discharge summary for a list of discharge medications.  Relevant Imaging Results:  Relevant Lab Results:   Additional Information SS# [id number]  Su Hilt, RN

## 2021-01-13 NOTE — Plan of Care (Signed)

## 2021-01-13 NOTE — TOC Progression Note (Signed)
Transition of Care [facility]) - Progression Note    Patient Details  Name: Ariana [name] MRN: [id number] Date of Birth: [birth year]  Transition of Care [facility]) CM/SW [place], RN Phone Number: 01/13/2021, 3:13 PM  Clinical Narrative:    Called and left a VM for the SW Faison at [facility], Requested a call back to discuss the bed offers Awaiting a call back   Expected Discharge Plan:  (TBD) Barriers to Discharge: Continued Medical Work up  Expected Discharge Plan and Services Expected Discharge Plan:  (TBD) In-house Referral: Chaplain   Post Acute Care Choice:  (TBD) Living arrangements for the past 2 months: Single Family Home                 DME Arranged:  (na)         HH Arranged:  (TBD)           Social Determinants of Health (SDOH) Interventions    Readmission Risk Interventions No flowsheet data found.

## 2021-01-14 DIAGNOSIS — D696 Thrombocytopenia, unspecified: Secondary | ICD-10-CM | POA: Diagnosis not present

## 2021-01-14 LAB — CBC WITH DIFFERENTIAL/PLATELET
Abs Immature Granulocytes: 0.08 10*3/uL — ABNORMAL HIGH (ref 0.00–0.07)
Basophils Absolute: 0.1 10*3/uL (ref 0.0–0.1)
Basophils Relative: 1 %
Eosinophils Absolute: 0.4 10*3/uL (ref 0.0–0.5)
Eosinophils Relative: 3 %
HCT: 35 % — ABNORMAL LOW (ref 36.0–46.0)
Hemoglobin: 11.3 g/dL — ABNORMAL LOW (ref 12.0–15.0)
Immature Granulocytes: 1 %
Lymphocytes Relative: 28 %
Lymphs Abs: 3.8 10*3/uL (ref 0.7–4.0)
MCH: 28.3 pg (ref 26.0–34.0)
MCHC: 32.3 g/dL (ref 30.0–36.0)
MCV: 87.7 fL (ref 80.0–100.0)
Monocytes Absolute: 1.4 10*3/uL — ABNORMAL HIGH (ref 0.1–1.0)
Monocytes Relative: 11 %
Neutro Abs: 7.8 10*3/uL — ABNORMAL HIGH (ref 1.7–7.7)
Neutrophils Relative %: 56 %
Platelets: 1028 10*3/uL (ref 150–400)
RBC: 3.99 MIL/uL (ref 3.87–5.11)
RDW: 18 % — ABNORMAL HIGH (ref 11.5–15.5)
Smear Review: INCREASED
WBC: 13.7 10*3/uL — ABNORMAL HIGH (ref 4.0–10.5)
nRBC: 0 % (ref 0.0–0.2)

## 2021-01-14 LAB — BASIC METABOLIC PANEL
Anion gap: 8 (ref 5–15)
BUN: 11 mg/dL (ref 8–23)
CO2: 24 mmol/L (ref 22–32)
Calcium: 8.8 mg/dL — ABNORMAL LOW (ref 8.9–10.3)
Chloride: 106 mmol/L (ref 98–111)
Creatinine, Ser: 0.86 mg/dL (ref 0.44–1.00)
GFR, Estimated: >60 mL/min (ref >60)
Glucose, Bld: 99 mg/dL (ref 70–99)
Potassium: 3.8 mmol/L (ref 3.5–5.1)
Sodium: 138 mmol/L (ref 135–145)

## 2021-01-14 LAB — MAGNESIUM: Magnesium: 1.9 mg/dL (ref 1.7–2.4)

## 2021-01-14 MED ORDER — AMLODIPINE BESYLATE 10 MG PO TABS
10.0000 mg | ORAL_TABLET | Freq: Every day | ORAL | Status: DC
  Administered 2021-01-15 – 2021-01-17 (×3): 10 mg via ORAL
  Filled 2021-01-14 (×3): qty 1

## 2021-01-14 NOTE — Care Management Important Message (Signed)
Important Message  Patient Details  Name: Ariana [name] MRN: [id number] Date of Birth: [birth year]   Medicare Important Message Given:  Yes  Gaston Islam, Supervisor with [facility] returned my call and I reviewed the Important Message from Medicare with her for Ariana [name].  She was in agreement with the discharge plan to a skilled facility.  I asked if she would like a copy of the form and she stated she was very familiar with this form and did not need a copy.  I thanked her for her time.   Juliann Pulse A Myka Lukins 01/14/2021, 2:40 PM

## 2021-01-14 NOTE — TOC Progression Note (Signed)
Transition of Care [facility]) - Progression Note    Patient Details  Name: [name] MRN: [id number] Date of Birth: 10/17/1932  Transition of Care [facility]) CM/SW [place], RN Phone Number: 01/14/2021, 12:19 PM  Clinical Narrative:   Spoke with the SW from [place], reviewed the bed choices, I explained she is ready medically to DC once we get the bed choice and Insurance approval, she will call me back with the choice    Expected Discharge Plan:  (TBD) Barriers to Discharge: Continued Medical Work up  Expected Discharge Plan and Services Expected Discharge Plan:  (TBD) In-house Referral: Chaplain   Post Acute Care Choice:  (TBD) Living arrangements for the past 2 months: Single Family Home                 DME Arranged:  (na)         HH Arranged:  (TBD)           Social Determinants of Health (SDOH) Interventions    Readmission Risk Interventions No flowsheet data found.

## 2021-01-14 NOTE — TOC Progression Note (Signed)
Transition of Care [facility]) - Progression Note    Patient Details  Name: Ariana [name] MRN: [id number] Date of Birth: Sep 06, 1933  Transition of Care [facility]) CM/SW [place], RN Phone Number: 01/14/2021, 3:47 PM  Clinical Narrative:    Left a secure detailed message with the admissions director requesting them to start insurance auth and accepting the bed offer, awaiting  acall back   Expected Discharge Plan:  (TBD) Barriers to Discharge: Continued Medical Work up  Expected Discharge Plan and Services Expected Discharge Plan:  (TBD) In-house Referral: Chaplain   Post Acute Care Choice:  (TBD) Living arrangements for the past 2 months: Single Family Home                 DME Arranged:  (na)         HH Arranged:  (TBD)           Social Determinants of Health (SDOH) Interventions    Readmission Risk Interventions No flowsheet data found.
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
## 2021-01-14 NOTE — Progress Notes (Signed)
PT Cancellation Note  Patient Details Name: Ariana [name] MRN: [id number] DOB: March 29, 1933   Cancelled Treatment:    Reason Eval/Treat Not Completed: Fatigue/lethargy limiting ability to participate;Patient's level of consciousness (Attempted evaluation, pt lethargic, mumbling, incoherence responses, tells me she's at work. Pt not following simple commands, not keeping eyes open. Will attempt again at later date/time.)   Trevione Wert C 01/14/2021, 9:48 AM

## 2021-01-15 ENCOUNTER — Telehealth: Payer: Self-pay | Admitting: Adult Health Nurse Practitioner

## 2021-01-15 DIAGNOSIS — E440 Moderate protein-calorie malnutrition: Secondary | ICD-10-CM

## 2021-01-15 DIAGNOSIS — I10 Essential (primary) hypertension: Secondary | ICD-10-CM

## 2021-01-15 DIAGNOSIS — J4521 Mild intermittent asthma with (acute) exacerbation: Secondary | ICD-10-CM | POA: Diagnosis not present

## 2021-01-15 DIAGNOSIS — D696 Thrombocytopenia, unspecified: Secondary | ICD-10-CM | POA: Diagnosis not present

## 2021-01-15 LAB — BASIC METABOLIC PANEL
Anion gap: 6 (ref 5–15)
BUN: 11 mg/dL (ref 8–23)
CO2: 26 mmol/L (ref 22–32)
Calcium: 8.9 mg/dL (ref 8.9–10.3)
Chloride: 107 mmol/L (ref 98–111)
Creatinine, Ser: 0.79 mg/dL (ref 0.44–1.00)
GFR, Estimated: >60 mL/min (ref >60)
Glucose, Bld: 88 mg/dL (ref 70–99)
Potassium: 3.7 mmol/L (ref 3.5–5.1)
Sodium: 139 mmol/L (ref 135–145)

## 2021-01-15 LAB — CBC
HCT: 35.2 % — ABNORMAL LOW (ref 36.0–46.0)
Hemoglobin: 11.2 g/dL — ABNORMAL LOW (ref 12.0–15.0)
MCH: 28.3 pg (ref 26.0–34.0)
MCHC: 31.8 g/dL (ref 30.0–36.0)
MCV: 88.9 fL (ref 80.0–100.0)
Platelets: 1053 10*3/uL (ref 150–400)
RBC: 3.96 MIL/uL (ref 3.87–5.11)
RDW: 18.2 % — ABNORMAL HIGH (ref 11.5–15.5)
WBC: 11.2 10*3/uL — ABNORMAL HIGH (ref 4.0–10.5)
nRBC: 0 % (ref 0.0–0.2)

## 2021-01-15 LAB — MAGNESIUM: Magnesium: 2 mg/dL (ref 1.7–2.4)

## 2021-01-15 MED ORDER — BUSPIRONE HCL 15 MG PO TABS
7.5000 mg | ORAL_TABLET | Freq: Two times a day (BID) | ORAL | Status: DC
  Administered 2021-01-15 – 2021-01-17 (×3): 7.5 mg via ORAL
  Filled 2021-01-15 (×5): qty 1
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
## 2021-01-15 NOTE — TOC Progression Note (Signed)
Transition of Care [facility]) - Progression Note    Patient Details  Name: Ariana [name] MRN: [id number] Date of Birth: 1932/12/19  Transition of Care [facility]) CM/SW [place], RN Phone Number: 01/15/2021, 4:24 PM  Clinical Narrative:   Ariana [name] at [facility] [phone] and explained that [facility] has decided to not accept the patient due to being on Psych medications, She stated that she was on the other line and she would call back in a few minutes, provided her with my contact information   Expected Discharge Plan:  (TBD) Barriers to Discharge: Continued Medical Work up  Expected Discharge Plan and Services Expected Discharge Plan:  (TBD) In-house Referral: Chaplain   Post Acute Care Choice:  (TBD) Living arrangements for the past 2 months: Single Family Home                 DME Arranged:  (na)         HH Arranged:  (TBD)           Social Determinants of Health (SDOH) Interventions    Readmission Risk Interventions No flowsheet data found.

## 2021-01-15 NOTE — Plan of Care (Signed)
  Problem: Health Behavior/Discharge Planning: Goal: Ability to manage health-related needs will improve Outcome: Progressing   

## 2021-01-15 NOTE — Progress Notes (Signed)
[facility] Liaison note:  This patient is currently enrolled in [facility] outpatient-based Palliative Care. Will continue to follow for disposition.  Please call with any outpatient palliative questions or concerns.  Thank you, Lorelee Market, LPN [facility] Hospital Liaison [phone]
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
## 2021-01-16 ENCOUNTER — Other Ambulatory Visit: Payer: Self-pay | Admitting: Adult Health Nurse Practitioner

## 2021-01-16 DIAGNOSIS — E039 Hypothyroidism, unspecified: Secondary | ICD-10-CM

## 2021-01-16 LAB — CBC
HCT: 31.6 % — ABNORMAL LOW (ref 36.0–46.0)
Hemoglobin: 10 g/dL — ABNORMAL LOW (ref 12.0–15.0)
MCH: 28.1 pg (ref 26.0–34.0)
MCHC: 31.6 g/dL (ref 30.0–36.0)
MCV: 88.8 fL (ref 80.0–100.0)
Platelets: 663 10*3/uL — ABNORMAL HIGH (ref 150–400)
RBC: 3.56 MIL/uL — ABNORMAL LOW (ref 3.87–5.11)
RDW: 18 % — ABNORMAL HIGH (ref 11.5–15.5)
WBC: 10.8 10*3/uL — ABNORMAL HIGH (ref 4.0–10.5)
nRBC: 0 % (ref 0.0–0.2)

## 2021-01-16 LAB — RESP PANEL BY RT-PCR (FLU A&B, COVID) ARPGX2
Influenza A by PCR: NEGATIVE
Influenza B by PCR: NEGATIVE
SARS Coronavirus 2 by RT PCR: NEGATIVE

## 2021-01-16 NOTE — TOC Progression Note (Signed)
Transition of Care [facility]) - Progression Note    Patient Details  Name: Ariana [name] MRN: [id number] Date of Birth: 1933/02/08  Transition of Care [facility]) CM/SW [place], RN Phone Number: 01/16/2021, 9:17 AM  Clinical Narrative:   Kizzie Bane with [facility] called and granted permission to accept the bed offer from [facility], [place] with Hilda Blades at [facility] and they will start insurance auth    Expected Discharge Plan:  (TBD) Barriers to Discharge: Continued Medical Work up  Expected Discharge Plan and Services Expected Discharge Plan:  (TBD) In-house Referral: Chaplain   Post Acute Care Choice:  (TBD) Living arrangements for the past 2 months: Single Family Home                 DME Arranged:  (na)         HH Arranged:  (TBD)           Social Determinants of Health (SDOH) Interventions    Readmission Risk Interventions No flowsheet data found.

## 2021-01-16 NOTE — Progress Notes (Signed)
Pt bed alarm is going off, pt has legs outside of the bed, pt assisted to recliner with chair alarm. Pt continues to talk about being at work, needing her shoes, and to find a way home.

## 2021-01-16 NOTE — TOC Progression Note (Signed)
Transition of Care [facility]) - Progression Note    Patient Details  Name: Ariana [name] MRN: [id number] Date of Birth: 1932/12/05  Transition of Care [facility]) CM/SW [place], RN Phone Number: 01/16/2021, 8:51 AM  Clinical Narrative:   Damaris Schooner with Hilda Blades at [facility] in [place] and she verified that they are still willing to accept the patient and can start authorization today to hopefully get approval today or at latest tomorrow, I  Spoke with SW [place] and explained the situation that [facility] has rescinded their offer of a bed,  Explained that [facility] is the last option available and they are still willing to accept the patient. She stated that she would call her program manager and call me back.   Expected Discharge Plan:  (TBD) Barriers to Discharge: Continued Medical Work up  Expected Discharge Plan and Services Expected Discharge Plan:  (TBD) In-house Referral: Chaplain   Post Acute Care Choice:  (TBD) Living arrangements for the past 2 months: Single Family Home                 DME Arranged:  (na)         HH Arranged:  (TBD)           Social Determinants of Health (SDOH) Interventions    Readmission Risk Interventions No flowsheet data found.
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
## 2021-01-16 NOTE — Progress Notes (Signed)
Pt attempting to get out of chair, assisted back to bed. Alarm set.
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
## 2021-01-16 NOTE — Progress Notes (Signed)
Pt is yelling out loud, "help me", "someone come in here", pt heard loudly down the hall. Pt was given a xanax, as ordered.

## 2021-01-16 NOTE — TOC Progression Note (Signed)
Transition of Care [facility]) - Progression Note    Patient Details  Name: Ariana [name] MRN: [id number] Date of Birth: 12/09/1932  Transition of Care [facility]) CM/SW [place], RN Phone Number: 01/16/2021, 3:17 PM  Clinical Narrative:   Received a call from [facility], they have gotten auth approval and can accept the patient tomorrow morning, Notified the physician    Expected Discharge Plan:  (TBD) Barriers to Discharge: Continued Medical Work up  Expected Discharge Plan and Services Expected Discharge Plan:  (TBD) In-house Referral: Chaplain   Post Acute Care Choice:  (TBD) Living arrangements for the past 2 months: Single Family Home                 DME Arranged:  (na)         HH Arranged:  (TBD)           Social Determinants of Health (SDOH) Interventions    Readmission Risk Interventions No flowsheet data found.

## 2021-01-16 NOTE — Progress Notes (Signed)
Pt assisted to [facility], with 2 assist. BM X1  Pt re oriented with no success. Pt thinks she is at the [facility]. She continues to try to get out of bed, seraquel given as ordered.

## 2021-01-16 NOTE — Plan of Care (Signed)
  Problem: Education: Goal: Knowledge of General Education information will improve Description: Including pain rating scale, medication(s)/side effects and non-pharmacologic comfort measures Outcome: Progressing   Problem: Health Behavior/Discharge Planning: Goal: Ability to manage health-related needs will improve Outcome: Progressing   Problem: Clinical Measurements: Goal: Will remain free from infection Outcome: Progressing   Problem: Clinical Measurements: Goal: Diagnostic test results will improve Outcome: Progressing   Problem: Nutrition: Goal: Adequate nutrition will be maintained Outcome: Progressing   Problem: Coping: Goal: Level of anxiety will decrease Outcome: Progressing   Problem: Elimination: Goal: Will not experience complications related to bowel motility Outcome: Progressing

## 2021-01-17 DIAGNOSIS — M1612 Unilateral primary osteoarthritis, left hip: Secondary | ICD-10-CM | POA: Diagnosis not present

## 2021-01-17 DIAGNOSIS — R278 Other lack of coordination: Secondary | ICD-10-CM | POA: Diagnosis not present

## 2021-01-17 DIAGNOSIS — G2581 Restless legs syndrome: Secondary | ICD-10-CM | POA: Diagnosis present

## 2021-01-17 DIAGNOSIS — R296 Repeated falls: Secondary | ICD-10-CM | POA: Diagnosis present

## 2021-01-17 DIAGNOSIS — M79651 Pain in right thigh: Secondary | ICD-10-CM | POA: Diagnosis not present

## 2021-01-17 DIAGNOSIS — I6529 Occlusion and stenosis of unspecified carotid artery: Secondary | ICD-10-CM | POA: Diagnosis not present

## 2021-01-17 DIAGNOSIS — R5381 Other malaise: Secondary | ICD-10-CM | POA: Diagnosis not present

## 2021-01-17 DIAGNOSIS — S6991XA Unspecified injury of right wrist, hand and finger(s), initial encounter: Secondary | ICD-10-CM | POA: Diagnosis not present

## 2021-01-17 DIAGNOSIS — D509 Iron deficiency anemia, unspecified: Secondary | ICD-10-CM | POA: Diagnosis present

## 2021-01-17 DIAGNOSIS — F0281 Dementia in other diseases classified elsewhere with behavioral disturbance: Secondary | ICD-10-CM | POA: Diagnosis not present

## 2021-01-17 DIAGNOSIS — S0003XA Contusion of scalp, initial encounter: Secondary | ICD-10-CM | POA: Diagnosis not present

## 2021-01-17 DIAGNOSIS — M79661 Pain in right lower leg: Secondary | ICD-10-CM | POA: Diagnosis not present

## 2021-01-17 DIAGNOSIS — F419 Anxiety disorder, unspecified: Secondary | ICD-10-CM | POA: Diagnosis present

## 2021-01-17 DIAGNOSIS — M810 Age-related osteoporosis without current pathological fracture: Secondary | ICD-10-CM | POA: Diagnosis present

## 2021-01-17 DIAGNOSIS — D649 Anemia, unspecified: Secondary | ICD-10-CM | POA: Diagnosis not present

## 2021-01-17 DIAGNOSIS — S60511A Abrasion of right hand, initial encounter: Secondary | ICD-10-CM | POA: Diagnosis present

## 2021-01-17 DIAGNOSIS — M4319 Spondylolisthesis, multiple sites in spine: Secondary | ICD-10-CM | POA: Diagnosis not present

## 2021-01-17 DIAGNOSIS — I10 Essential (primary) hypertension: Secondary | ICD-10-CM | POA: Diagnosis not present

## 2021-01-17 DIAGNOSIS — M545 Low back pain, unspecified: Secondary | ICD-10-CM | POA: Diagnosis not present

## 2021-01-17 DIAGNOSIS — D693 Immune thrombocytopenic purpura: Secondary | ICD-10-CM | POA: Diagnosis not present

## 2021-01-17 DIAGNOSIS — J4540 Moderate persistent asthma, uncomplicated: Secondary | ICD-10-CM | POA: Diagnosis not present

## 2021-01-17 DIAGNOSIS — M25521 Pain in right elbow: Secondary | ICD-10-CM | POA: Diagnosis not present

## 2021-01-17 DIAGNOSIS — M5030 Other cervical disc degeneration, unspecified cervical region: Secondary | ICD-10-CM | POA: Diagnosis not present

## 2021-01-17 DIAGNOSIS — J449 Chronic obstructive pulmonary disease, unspecified: Secondary | ICD-10-CM | POA: Diagnosis present

## 2021-01-17 DIAGNOSIS — M6281 Muscle weakness (generalized): Secondary | ICD-10-CM | POA: Diagnosis not present

## 2021-01-17 DIAGNOSIS — G4733 Obstructive sleep apnea (adult) (pediatric): Secondary | ICD-10-CM | POA: Diagnosis present

## 2021-01-17 DIAGNOSIS — F05 Delirium due to known physiological condition: Secondary | ICD-10-CM | POA: Diagnosis not present

## 2021-01-17 DIAGNOSIS — M1711 Unilateral primary osteoarthritis, right knee: Secondary | ICD-10-CM | POA: Diagnosis not present

## 2021-01-17 DIAGNOSIS — S79912A Unspecified injury of left hip, initial encounter: Secondary | ICD-10-CM | POA: Diagnosis not present

## 2021-01-17 DIAGNOSIS — G309 Alzheimer's disease, unspecified: Secondary | ICD-10-CM | POA: Diagnosis not present

## 2021-01-17 DIAGNOSIS — Y92092 Bedroom in other non-institutional residence as the place of occurrence of the external cause: Secondary | ICD-10-CM | POA: Diagnosis not present

## 2021-01-17 DIAGNOSIS — E039 Hypothyroidism, unspecified: Secondary | ICD-10-CM | POA: Diagnosis not present

## 2021-01-17 DIAGNOSIS — F0280 Dementia in other diseases classified elsewhere without behavioral disturbance: Secondary | ICD-10-CM | POA: Diagnosis not present

## 2021-01-17 DIAGNOSIS — S61411A Laceration without foreign body of right hand, initial encounter: Secondary | ICD-10-CM | POA: Diagnosis not present

## 2021-01-17 DIAGNOSIS — R2689 Other abnormalities of gait and mobility: Secondary | ICD-10-CM | POA: Diagnosis not present

## 2021-01-17 DIAGNOSIS — W19XXXA Unspecified fall, initial encounter: Secondary | ICD-10-CM | POA: Diagnosis not present

## 2021-01-17 DIAGNOSIS — M19071 Primary osteoarthritis, right ankle and foot: Secondary | ICD-10-CM | POA: Diagnosis not present

## 2021-01-17 DIAGNOSIS — R41841 Cognitive communication deficit: Secondary | ICD-10-CM | POA: Diagnosis not present

## 2021-01-17 DIAGNOSIS — Z20822 Contact with and (suspected) exposure to covid-19: Secondary | ICD-10-CM | POA: Diagnosis not present

## 2021-01-17 DIAGNOSIS — R58 Hemorrhage, not elsewhere classified: Secondary | ICD-10-CM | POA: Diagnosis not present

## 2021-01-17 DIAGNOSIS — E7800 Pure hypercholesterolemia, unspecified: Secondary | ICD-10-CM | POA: Diagnosis present

## 2021-01-17 DIAGNOSIS — I739 Peripheral vascular disease, unspecified: Secondary | ICD-10-CM | POA: Diagnosis not present

## 2021-01-17 DIAGNOSIS — E611 Iron deficiency: Secondary | ICD-10-CM | POA: Diagnosis not present

## 2021-01-17 DIAGNOSIS — E785 Hyperlipidemia, unspecified: Secondary | ICD-10-CM | POA: Diagnosis present

## 2021-01-17 DIAGNOSIS — W06XXXA Fall from bed, initial encounter: Secondary | ICD-10-CM | POA: Diagnosis present

## 2021-01-17 DIAGNOSIS — Z823 Family history of stroke: Secondary | ICD-10-CM | POA: Diagnosis not present

## 2021-01-17 DIAGNOSIS — Z66 Do not resuscitate: Secondary | ICD-10-CM | POA: Diagnosis not present

## 2021-01-17 DIAGNOSIS — D696 Thrombocytopenia, unspecified: Secondary | ICD-10-CM | POA: Diagnosis not present

## 2021-01-17 DIAGNOSIS — Z981 Arthrodesis status: Secondary | ICD-10-CM | POA: Diagnosis not present

## 2021-01-17 DIAGNOSIS — Z9081 Acquired absence of spleen: Secondary | ICD-10-CM | POA: Diagnosis not present

## 2021-01-17 DIAGNOSIS — K219 Gastro-esophageal reflux disease without esophagitis: Secondary | ICD-10-CM | POA: Diagnosis not present

## 2021-01-17 DIAGNOSIS — S50311A Abrasion of right elbow, initial encounter: Secondary | ICD-10-CM | POA: Diagnosis present

## 2021-01-17 DIAGNOSIS — E440 Moderate protein-calorie malnutrition: Secondary | ICD-10-CM | POA: Diagnosis not present

## 2021-01-17 DIAGNOSIS — F32A Depression, unspecified: Secondary | ICD-10-CM | POA: Diagnosis present

## 2021-01-17 DIAGNOSIS — R4689 Other symptoms and signs involving appearance and behavior: Secondary | ICD-10-CM | POA: Diagnosis not present

## 2021-01-17 DIAGNOSIS — D473 Essential (hemorrhagic) thrombocythemia: Secondary | ICD-10-CM | POA: Diagnosis not present

## 2021-01-17 DIAGNOSIS — J441 Chronic obstructive pulmonary disease with (acute) exacerbation: Secondary | ICD-10-CM | POA: Diagnosis not present

## 2021-01-17 LAB — CBC
HCT: 33.3 % — ABNORMAL LOW (ref 36.0–46.0)
Hemoglobin: 10.9 g/dL — ABNORMAL LOW (ref 12.0–15.0)
MCH: 28.6 pg (ref 26.0–34.0)
MCHC: 32.7 g/dL (ref 30.0–36.0)
MCV: 87.4 fL (ref 80.0–100.0)
Platelets: 675 10*3/uL — ABNORMAL HIGH (ref 150–400)
RBC: 3.81 MIL/uL — ABNORMAL LOW (ref 3.87–5.11)
RDW: 18.2 % — ABNORMAL HIGH (ref 11.5–15.5)
WBC: 11.9 10*3/uL — ABNORMAL HIGH (ref 4.0–10.5)
nRBC: 0 % (ref 0.0–0.2)

## 2021-01-17 MED ORDER — LORATADINE 10 MG PO TABS: 10.0000 mg | ORAL_TABLET | Freq: Every day | ORAL | Status: AC

## 2021-01-17 MED ORDER — BUSPIRONE HCL 7.5 MG PO TABS: 7.5000 mg | ORAL_TABLET | Freq: Two times a day (BID) | ORAL | Status: DC

## 2021-01-17 MED ORDER — ENSURE ENLIVE PO LIQD: 237.0000 mL | Freq: Three times a day (TID) | ORAL | 12 refills | Status: DC

## 2021-01-17 MED ORDER — POLYSACCHARIDE IRON COMPLEX 150 MG PO CAPS: 150.0000 mg | ORAL_CAPSULE | Freq: Every day | ORAL | Status: AC

## 2021-01-17 MED ORDER — ONDANSETRON HCL 4 MG PO TABS: 4.0000 mg | ORAL_TABLET | Freq: Four times a day (QID) | ORAL | 0 refills | Status: DC | PRN

## 2021-01-17 MED ORDER — QUETIAPINE FUMARATE 25 MG PO TABS
12.5000 mg | ORAL_TABLET | Freq: Four times a day (QID) | ORAL | Status: DC | PRN
Start: 2021-01-17 — End: 2021-07-15

## 2021-01-17 MED ORDER — AMLODIPINE BESYLATE 10 MG PO TABS: 10.0000 mg | ORAL_TABLET | Freq: Every day | ORAL | Status: AC

## 2021-01-17 NOTE — TOC Progression Note (Signed)
Transition of Care [facility]) - Progression Note    Patient Details  Name: Ariana [name] MRN: [id number] Date of Birth: 12-Sep-1933  Transition of Care [facility]) CM/SW [place], RN Phone Number: 01/17/2021, 12:25 PM  Clinical Narrative:   Called EMS to arrange non urgent transport, They have placed her on the list and she is 3rd to be picked up, notified the nurse   Expected Discharge Plan:  (TBD) Barriers to Discharge: Continued Medical Work up  Expected Discharge Plan and Services Expected Discharge Plan:  (TBD) In-house Referral: Chaplain   Post Acute Care Choice:  (TBD) Living arrangements for the past 2 months: Single Family Home Expected Discharge Date: 01/17/21               DME Arranged:  (na)         HH Arranged:  (TBD)           Social Determinants of Health (SDOH) Interventions    Readmission Risk Interventions No flowsheet data found.

## 2021-01-17 NOTE — Plan of Care (Signed)
  Problem: Education: Goal: Knowledge of General Education information will improve Description: Including pain rating scale, medication(s)/side effects and non-pharmacologic comfort measures 01/17/2021 0311 by Iran Ouch, RN Outcome: Progressing 01/16/2021 2304 by Iran Ouch, RN Outcome: Progressing   Problem: Health Behavior/Discharge Planning: Goal: Ability to manage health-related needs will improve 01/17/2021 0311 by Iran Ouch, RN Outcome: Progressing 01/16/2021 2304 by Iran Ouch, RN Outcome: Progressing   Problem: Clinical Measurements: Goal: Ability to maintain clinical measurements within normal limits will improve 01/17/2021 0311 by Iran Ouch, RN Outcome: Progressing 01/16/2021 2304 by Iran Ouch, RN Outcome: Progressing   Problem: Clinical Measurements: Goal: Will remain free from infection 01/17/2021 0311 by Iran Ouch, RN Outcome: Progressing 01/16/2021 2304 by Iran Ouch, RN Outcome: Progressing

## 2021-01-17 NOTE — Progress Notes (Signed)
Transported to [facility] care via non-emergency self transport.

## 2021-01-17 NOTE — Progress Notes (Addendum)
[employer]. Reported to facility Nurse Ander Purpura, RN.

## 2021-01-17 NOTE — TOC Progression Note (Signed)
Transition of Care [facility]) - Progression Note    Patient Details  Name: Ariana [name] MRN: [id number] Date of Birth: Mar 04, 1933  Transition of Care [facility]) CM/SW [place], RN Phone Number: 01/17/2021, 2:46 PM  Clinical Narrative:   [facility] transport to follow up on transportation to [place] in [place],  They stated that they are on their way, will be here within the hour, will call once they get to the medical mall to bring the patient down to transport to [place]    Expected Discharge Plan:  (TBD) Barriers to Discharge: Continued Medical Work up  Expected Discharge Plan and Services Expected Discharge Plan:  (TBD) In-house Referral: Chaplain   Post Acute Care Choice:  (TBD) Living arrangements for the past 2 months: Single Family Home Expected Discharge Date: 01/17/21               DME Arranged:  (na)         HH Arranged:  (TBD)           Social Determinants of Health (SDOH) Interventions    Readmission Risk Interventions No flowsheet data found.

## 2021-01-17 NOTE — TOC Progression Note (Signed)
Transition of Care [facility]) - Progression Note    Patient Details  Name: Ariana [name] MRN: [id number] Date of Birth: January 16, 1933  Transition of Care [facility]) CM/SW [place], RN Phone Number: 01/17/2021, 1:38 PM  Clinical Narrative:   [facility] unable to rtransport the patient due to not having a truck available, [facility] called back and stated that they did not have an available truck and could not transport the patient, [facility] stated they did not have a truck to transport the patient, [employer] they will call me back once they have arranged transportation    Expected Discharge Plan:  (TBD) Barriers to Discharge: Continued Medical Work up  Expected Discharge Plan and Services Expected Discharge Plan:  (TBD) In-house Referral: Chaplain   Post Acute Care Choice:  (TBD) Living arrangements for the past 2 months: Single Family Home Expected Discharge Date: 01/17/21               DME Arranged:  (na)         HH Arranged:  (TBD)           Social Determinants of Health (SDOH) Interventions    Readmission Risk Interventions No flowsheet data found.

## 2021-01-17 NOTE — Care Management Important Message (Signed)
Important Message  Patient Details  Name: Ariana [name] MRN: [id number] Date of Birth: [birth year]   Medicare Important Message Given:  Other (see comment)  Left message with [place] with [employer] at [phone] to review Medicare IM.     Dannette Stephaney 01/17/2021, 1:14 PM

## 2021-01-17 NOTE — Progress Notes (Signed)
No acute changes overnight slept throughout most of the shift, wakes intermittently. Vital signs stable. Observation continues.

## 2021-01-17 NOTE — Discharge Summary (Addendum)
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 washings and aspirates are unacceptable for Xpert Xpress SARS-CoV-2/FLU/RSV testing.  Fact Sheet for Patients: EntrepreneurPulse.com.au  Fact Sheet for Healthcare Providers: IncredibleEmployment.be  This test is not yet approved or cleared by the [place] FDA and has been authorized for detection and/or diagnosis of SARS-CoV-2 by FDA under an Emergency Use Authorization (EUA). This EUA will  remain in effect (meaning this test can be used) for the duration of the COVID-19 declaration under Section 564(b)(1) of the Act, 21 U.S.C. section 360bbb-3(b)(1), unless the authorization is terminated or revoked.  Performed at [facility], [address] [place], [place] [address]      Time coordinating discharge: Over 30 minutes  SIGNED:   Ezekiel Slocumb, DO [facility] 01/17/2021, 8:55 AM   If 7PM-7AM, please contact night-coverage [email or web address]

## 2021-01-17 NOTE — TOC Progression Note (Signed)
Transition of Care [facility]) - Progression Note    Patient Details  Name: Ariana [name] MRN: [id number] Date of Birth: [birth year]  Transition of [facility]) CM/SW [place], RN Phone Number: 01/17/2021, 10:13 AM  Clinical Narrative:    Yates Decamp in admissions with [facility], left a VM requesting a bed number and number to call report to, Sent DC papers thru the Hub, DC packet on the chart, called SW Faison at [facility] and notified that the patient will DC to [place] today,    Expected Discharge Plan:  (TBD) Barriers to Discharge: Continued Medical Work up  Expected Discharge Plan and Services Expected Discharge Plan:  (TBD) In-house Referral: Chaplain   Post Acute Care Choice:  (TBD) Living arrangements for the past 2 months: Single Family Home Expected Discharge Date: 01/17/21               DME Arranged:  (na)         HH Arranged:  (TBD)           Social Determinants of Health (SDOH) Interventions    Readmission Risk Interventions No flowsheet data found.
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
## 2021-01-20 DIAGNOSIS — J441 Chronic obstructive pulmonary disease with (acute) exacerbation: Secondary | ICD-10-CM | POA: Diagnosis not present

## 2021-01-20 DIAGNOSIS — D696 Thrombocytopenia, unspecified: Secondary | ICD-10-CM | POA: Diagnosis not present

## 2021-01-20 DIAGNOSIS — E611 Iron deficiency: Secondary | ICD-10-CM | POA: Diagnosis not present

## 2021-01-20 DIAGNOSIS — E440 Moderate protein-calorie malnutrition: Secondary | ICD-10-CM | POA: Diagnosis not present

## 2021-01-20 DIAGNOSIS — J4540 Moderate persistent asthma, uncomplicated: Secondary | ICD-10-CM | POA: Diagnosis not present

## 2021-01-20 DIAGNOSIS — K219 Gastro-esophageal reflux disease without esophagitis: Secondary | ICD-10-CM | POA: Diagnosis not present

## 2021-01-20 DIAGNOSIS — E039 Hypothyroidism, unspecified: Secondary | ICD-10-CM | POA: Diagnosis not present

## 2021-01-20 DIAGNOSIS — F0281 Dementia in other diseases classified elsewhere with behavioral disturbance: Secondary | ICD-10-CM | POA: Diagnosis not present

## 2021-01-21 DIAGNOSIS — R5381 Other malaise: Secondary | ICD-10-CM | POA: Diagnosis not present

## 2021-01-21 DIAGNOSIS — D696 Thrombocytopenia, unspecified: Secondary | ICD-10-CM | POA: Diagnosis not present

## 2021-01-29 ENCOUNTER — Inpatient Hospital Stay: Payer: Medicare HMO

## 2021-01-29 ENCOUNTER — Inpatient Hospital Stay: Payer: Medicare HMO | Admitting: Oncology

## 2021-01-29 DIAGNOSIS — R4689 Other symptoms and signs involving appearance and behavior: Secondary | ICD-10-CM | POA: Diagnosis not present

## 2021-01-29 DIAGNOSIS — E039 Hypothyroidism, unspecified: Secondary | ICD-10-CM | POA: Diagnosis not present

## 2021-01-29 DIAGNOSIS — F0281 Dementia in other diseases classified elsewhere with behavioral disturbance: Secondary | ICD-10-CM | POA: Diagnosis not present

## 2021-01-29 DIAGNOSIS — R5381 Other malaise: Secondary | ICD-10-CM | POA: Diagnosis not present

## 2021-02-02 NOTE — Progress Notes (Deleted)
[facility] [place]  [phone]  [phone]) [phone]     Jovee Dettinger DOB: September 05, 1933  MR#: [id number]  VVO#:[id number]  Patient Care Team: Idelle Crouch, MD as PCP - General (Internal Medicine) Lloyd Huger, MD as Consulting Physician (Oncology)   CHIEF COMPLAINT: Chronic refractory ITP  INTERVAL HISTORY: Patient returns to clinic today for repeat laboratory work, further evaluation, and continuation of Nplate.  She continues to have weakness and fatigue and decreased mobility, but has now completed physical therapy.  She continues to have chronic confusion.  She denies any easy bleeding or bruising.  She has no neurologic complaints.  She denies any recent fevers or illnesses. She denies any chest pain, shortness of breath, cough, or hemoptysis.  She denies any nausea, vomiting, constipation, or diarrhea.  She has no melena or hematochezia.  She has no urinary complaints.  Patient offers no further specific complaints today.  REVIEW OF SYSTEMS:   Review of Systems  Constitutional: Positive for malaise/fatigue. Negative for fever and weight loss.  HENT: Negative for congestion.   Respiratory: Negative.  Negative for cough, hemoptysis and shortness of breath.   Cardiovascular: Negative.  Negative for chest pain and leg swelling.  Gastrointestinal: Negative.  Negative for abdominal pain, blood in stool, diarrhea, melena and nausea.  Genitourinary: Negative.  Negative for dysuria and hematuria.  Musculoskeletal: Positive for joint pain. Negative for back pain and falls.  Skin: Negative.  Negative for rash.  Neurological: Positive for weakness. Negative for dizziness, sensory change, focal weakness and headaches.  Endo/Heme/Allergies: Does not bruise/bleed easily.  Psychiatric/Behavioral: Positive for memory loss. Negative for depression. The patient is not nervous/anxious.     As per HPI. Otherwise, a complete review of systems is  negative.  PAST MEDICAL HISTORY: Past Medical History:  Diagnosis Date  . Alzheimer disease ([place])   . Anemia   . Anxiety   . Asthma    WELL CONTROLLED  . Chronic back pain   . Depression   . Depression   . GERD (gastroesophageal reflux disease)   . Hiatal hernia   . Hypercholesteremia   . Hypothyroidism   . ITP (idiopathic thrombocytopenic purpura)    FOLLOWED BR DR Grayland Ormond  . LBBB (left bundle branch block)   . Osteoarthritis   . Osteoporosis   . Restless legs     PAST SURGICAL HISTORY: Past Surgical History:  Procedure Laterality Date  . CARPAL TUNNEL RELEASE    . ESOPHAGOGASTRODUODENOSCOPY (EGD) WITH PROPOFOL N/A 09/10/2015   Procedure: ESOPHAGOGASTRODUODENOSCOPY (EGD) WITH PROPOFOL;  Surgeon: Lollie Sails, MD;  Location: [facility];  Service: Endoscopy;  Laterality: N/A;  . HIP PINNING,CANNULATED Left 10/31/2020   Procedure: CANNULATED HIP PINNING;  Surgeon: Leim Fabry, MD;  Location: [facility] ORS;  Service: Orthopedics;  Laterality: Left;  . IRRIGATION AND DEBRIDEMENT HEMATOMA Right 07/13/2018   Procedure: IRRIGATION AND DEBRIDEMENT HEMATOMA-RIGHT SHIN;  Surgeon: Herbert Pun, MD;  Location: [facility] ORS;  Service: General;  Laterality: Right;  . KYPHOPLASTY N/A 05/02/2019   Procedure: L1 KYPHOPLASTY;  Surgeon: Hessie Knows, MD;  Location: [facility] ORS;  Service: Orthopedics;  Laterality: N/A;  . LUMBAR [place]    . REVERSE SHOULDER ARTHROPLASTY Left 06/25/2020   Procedure: REVERSE SHOULDER ARTHROPLASTY;  Surgeon: Corky Mull, MD;  Location: [facility] ORS;  Service: Orthopedics;  Laterality: Left;  . SPLENECTOMY, PARTIAL    . TOTAL ABDOMINAL HYSTERECTOMY      FAMILY HISTORY Family History  Problem Relation Age of Onset  .  Stroke Mother     GYNECOLOGIC HISTORY:  No LMP recorded. Patient has had a hysterectomy.     ADVANCED DIRECTIVES:    HEALTH MAINTENANCE: Social History   Tobacco Use  . Smoking status: Never Smoker  . Smokeless tobacco: Never  Used  Vaping Use  . Vaping Use: Never used  Substance Use Topics  . Alcohol use: No    Alcohol/week: 0.0 standard drinks  . Drug use: No     Allergies  Allergen Reactions  . Aspirin     Upset stomach  . Diazepam Itching    sneezing  . Other     seasonal allergies  . Tetanus Toxoids     Localized superficial swelling of skin  . Morphine Itching and Rash    Current Outpatient Medications  Medication Sig Dispense Refill  . acetaminophen (TYLENOL) 325 MG tablet Take 2 tablets (650 mg total) by mouth every 6 (six) hours as needed for mild pain (or Fever >/= 101). 100 tablet 0  . albuterol (PROVENTIL HFA;VENTOLIN HFA) 108 (90 BASE) MCG/ACT inhaler Inhale 2 puffs into the lungs every 6 (six) hours as needed for wheezing or shortness of breath.    . ALPRAZolam (XANAX) 0.5 MG tablet Take 1 tablet (0.5 mg total) by mouth 3 (three) times daily as needed for anxiety. 20 tablet 0  . amLODipine (NORVASC) 10 MG tablet Take 1 tablet (10 mg total) by mouth daily.    . benzonatate (TESSALON) 200 MG capsule Take 200 mg by mouth 3 (three) times daily.    Marland Kitchen buPROPion (WELLBUTRIN XL) 150 MG 24 hr tablet Take 150 mg by mouth every morning.     . busPIRone (BUSPAR) 7.5 MG tablet Take 1 tablet (7.5 mg total) by mouth 2 (two) times daily.    . Calcium Carbonate-Vit D-Min (CALCIUM 600+D PLUS MINERALS) 600-400 MG-UNIT TABS Take 1 tablet by mouth daily.     . Carboxymethylcellul-Glycerin (LUBRICATING EYE DROPS OP) Place 1 drop into both eyes daily as needed (dry eyes). (Patient not taking: No sig reported)    . Cholecalciferol (VITAMIN D) 50 MCG (2000 UT) tablet Take 2,000 Units by mouth daily.  (Patient not taking: No sig reported)    . citalopram (CELEXA) 40 MG tablet Take 40 mg by mouth every morning.     . Cyanocobalamin 1000 MCG TBCR Take 1,000 mcg by mouth daily.     Marland Kitchen donepezil (ARICEPT) 10 MG tablet Take 10 mg by mouth at bedtime.    . feeding supplement (ENSURE ENLIVE / ENSURE PLUS) LIQD Take 237  mLs by mouth 3 (three) times daily between meals. 237 mL 12  . fluticasone (FLONASE) 50 MCG/ACT nasal spray Place 2 sprays into both nostrils daily as needed for allergies.     . Fluticasone-Salmeterol (ADVAIR) 250-50 MCG/DOSE AEPB Inhale 1 puff into the lungs 2 (two) times daily as needed (shortness of breath).     . gabapentin (NEURONTIN) 300 MG capsule Take 300 mg by mouth 4 (four) times daily.    . iron polysaccharides (NIFEREX) 150 MG capsule Take 1 capsule (150 mg total) by mouth daily.    Marland Kitchen lamoTRIgine (LAMICTAL) 25 MG tablet Take 50 mg by mouth at bedtime.    Marland Kitchen levothyroxine (SYNTHROID, LEVOTHROID) 50 MCG tablet Take 50 mcg by mouth daily before breakfast. Take 30 to 60 minutes before breakfast.    . loratadine (CLARITIN) 10 MG tablet Take 1 tablet (10 mg total) by mouth daily.    . memantine (NAMENDA) 10 MG  tablet Take 10 mg by mouth 2 (two) times daily.     . montelukast (SINGULAIR) 10 MG tablet Take 10 mg by mouth at bedtime.     . Multiple Vitamin (MULTI-VITAMIN) tablet Take 1 tablet by mouth daily.  (Patient not taking: No sig reported)    . Multiple Vitamins-Minerals (PRESERVISION AREDS 2) CAPS Take 1 capsule by mouth 2 (two) times a day. (Patient not taking: No sig reported)    . ondansetron (ZOFRAN) 4 MG tablet Take 1 tablet (4 mg total) by mouth every 6 (six) hours as needed for nausea. 20 tablet 0  . oxybutynin (DITROPAN) 5 MG tablet Take 5 mg by mouth 3 (three) times daily.    . pantoprazole (PROTONIX) 20 MG tablet Take 20 mg by mouth 2 (two) times daily.     . pravastatin (PRAVACHOL) 20 MG tablet Take 20 mg by mouth at bedtime.     Marland Kitchen QUEtiapine (SEROQUEL) 25 MG tablet Take 0.5 tablets (12.5 mg total) by mouth every 6 (six) hours as needed (agitation).    . vitamin C (ASCORBIC ACID) 500 MG tablet Take 500 mg by mouth daily.    . vitamin E 180 MG (400 UNITS) capsule Take 400 Units by mouth daily.     No current facility-administered medications for this visit.     OBJECTIVE: There were no vitals taken for this visit.   There is no height or weight on file to calculate BMI.    ECOG FS:2 - Symptomatic, <50% confined to bed  General: Thin, no acute distress.  Sitting in a wheelchair. Eyes: Pink conjunctiva, anicteric sclera. HEENT: Normocephalic, moist mucous membranes. Lungs: No audible wheezing or coughing. Heart: Regular rate and rhythm. Abdomen: Soft, nontender, no obvious distention. Musculoskeletal: No edema, cyanosis, or clubbing. Neuro: Alert, answering all questions appropriately. Cranial nerves grossly intact. Skin: No rashes or petechiae noted. Psych: Normal affect.   LAB RESULTS:  No visits with results within 3 Day(s) from this visit.  Latest known visit with results is:  No results displayed because visit has over 200 results.      STUDIES: No results found.  ASSESSMENT:  Chronic refractory ITP  PLAN:   1. Chronic refractory ITP: Patient had a poor response to Prednisone, WinRho, and Rituxan. She is status post splenectomy in 2007.  She received IVIG and Nplate while in the hospital with significant improvement of her platelet count.   If Nplate no longer was effective, could retry IVIG at a future date.  Patient is currently receiving the max dose of Nplate at 10 mcg/kg.  Can also consider Promacta 12.5mg  or Tavalisse 100 mg BID if needed.  Patient's platelet count was 14, therefore will proceed with Nplate today.  Return to clinic in 4 weeks for further evaluation and continuation of treatment.   2. Weakness and fatigue: Chronic and unchanged.  Recommended patient continue exercises provided by physical therapy. 3.  Depression/grief: Resolved. 4.  Poor appetite/weight loss: Resolved.  5.  Dementia: Chronic and unchanged. 6.  Leukocytosis: Improved. 7.  Anemia: Chronic and unchanged. 8.  Fractured hip: Continue follow-up with orthopedics as scheduled.  Continue exercises provided by physical therapy as above. 9.  Low  blood pressure: Encourage increased fluid intake.   Patient expressed understanding and was in agreement with this plan. She also understands that She can call clinic at any time with any questions, concerns, or complaints.    Lloyd Huger, MD   02/02/2021 10:11 AM

## 2021-02-03 ENCOUNTER — Other Ambulatory Visit: Payer: Self-pay

## 2021-02-03 ENCOUNTER — Emergency Department (HOSPITAL_COMMUNITY): Payer: Medicare HMO

## 2021-02-03 ENCOUNTER — Encounter (HOSPITAL_COMMUNITY): Payer: Self-pay | Admitting: *Deleted

## 2021-02-03 ENCOUNTER — Inpatient Hospital Stay (HOSPITAL_COMMUNITY)
Admission: EM | Admit: 2021-02-03 | Discharge: 2021-02-06 | DRG: 813 | Disposition: A | Discharge status: Skilled Nursing Facility | Payer: Medicare HMO | Source: Skilled Nursing Facility | Attending: Family Medicine | Admitting: Family Medicine

## 2021-02-03 DIAGNOSIS — E7800 Pure hypercholesterolemia, unspecified: Secondary | ICD-10-CM | POA: Diagnosis present

## 2021-02-03 DIAGNOSIS — R296 Repeated falls: Secondary | ICD-10-CM | POA: Diagnosis present

## 2021-02-03 DIAGNOSIS — Z886 Allergy status to analgesic agent status: Secondary | ICD-10-CM

## 2021-02-03 DIAGNOSIS — G2581 Restless legs syndrome: Secondary | ICD-10-CM | POA: Diagnosis present

## 2021-02-03 DIAGNOSIS — R41841 Cognitive communication deficit: Secondary | ICD-10-CM | POA: Diagnosis not present

## 2021-02-03 DIAGNOSIS — W19XXXA Unspecified fall, initial encounter: Secondary | ICD-10-CM

## 2021-02-03 DIAGNOSIS — D649 Anemia, unspecified: Secondary | ICD-10-CM

## 2021-02-03 DIAGNOSIS — M5030 Other cervical disc degeneration, unspecified cervical region: Secondary | ICD-10-CM | POA: Diagnosis not present

## 2021-02-03 DIAGNOSIS — D509 Iron deficiency anemia, unspecified: Secondary | ICD-10-CM | POA: Diagnosis present

## 2021-02-03 DIAGNOSIS — F32A Depression, unspecified: Secondary | ICD-10-CM | POA: Diagnosis present

## 2021-02-03 DIAGNOSIS — Z823 Family history of stroke: Secondary | ICD-10-CM | POA: Diagnosis not present

## 2021-02-03 DIAGNOSIS — Z79899 Other long term (current) drug therapy: Secondary | ICD-10-CM

## 2021-02-03 DIAGNOSIS — F0280 Dementia in other diseases classified elsewhere without behavioral disturbance: Secondary | ICD-10-CM | POA: Diagnosis present

## 2021-02-03 DIAGNOSIS — G4733 Obstructive sleep apnea (adult) (pediatric): Secondary | ICD-10-CM | POA: Diagnosis present

## 2021-02-03 DIAGNOSIS — S60511A Abrasion of right hand, initial encounter: Secondary | ICD-10-CM | POA: Diagnosis present

## 2021-02-03 DIAGNOSIS — D696 Thrombocytopenia, unspecified: Secondary | ICD-10-CM | POA: Diagnosis present

## 2021-02-03 DIAGNOSIS — E785 Hyperlipidemia, unspecified: Secondary | ICD-10-CM | POA: Diagnosis present

## 2021-02-03 DIAGNOSIS — Z66 Do not resuscitate: Secondary | ICD-10-CM | POA: Diagnosis present

## 2021-02-03 DIAGNOSIS — D473 Essential (hemorrhagic) thrombocythemia: Secondary | ICD-10-CM | POA: Diagnosis not present

## 2021-02-03 DIAGNOSIS — Z9081 Acquired absence of spleen: Secondary | ICD-10-CM | POA: Diagnosis not present

## 2021-02-03 DIAGNOSIS — I10 Essential (primary) hypertension: Secondary | ICD-10-CM | POA: Diagnosis not present

## 2021-02-03 DIAGNOSIS — Z96612 Presence of left artificial shoulder joint: Secondary | ICD-10-CM | POA: Diagnosis present

## 2021-02-03 DIAGNOSIS — M545 Low back pain, unspecified: Secondary | ICD-10-CM | POA: Diagnosis not present

## 2021-02-03 DIAGNOSIS — M1612 Unilateral primary osteoarthritis, left hip: Secondary | ICD-10-CM | POA: Diagnosis not present

## 2021-02-03 DIAGNOSIS — F419 Anxiety disorder, unspecified: Secondary | ICD-10-CM | POA: Diagnosis present

## 2021-02-03 DIAGNOSIS — Z7401 Bed confinement status: Secondary | ICD-10-CM

## 2021-02-03 DIAGNOSIS — W06XXXA Fall from bed, initial encounter: Secondary | ICD-10-CM | POA: Diagnosis present

## 2021-02-03 DIAGNOSIS — M6281 Muscle weakness (generalized): Secondary | ICD-10-CM | POA: Diagnosis not present

## 2021-02-03 DIAGNOSIS — F05 Delirium due to known physiological condition: Secondary | ICD-10-CM | POA: Diagnosis present

## 2021-02-03 DIAGNOSIS — I739 Peripheral vascular disease, unspecified: Secondary | ICD-10-CM | POA: Diagnosis not present

## 2021-02-03 DIAGNOSIS — I6529 Occlusion and stenosis of unspecified carotid artery: Secondary | ICD-10-CM | POA: Diagnosis not present

## 2021-02-03 DIAGNOSIS — Z79890 Hormone replacement therapy (postmenopausal): Secondary | ICD-10-CM

## 2021-02-03 DIAGNOSIS — Y92092 Bedroom in other non-institutional residence as the place of occurrence of the external cause: Secondary | ICD-10-CM

## 2021-02-03 DIAGNOSIS — S61411A Laceration without foreign body of right hand, initial encounter: Secondary | ICD-10-CM

## 2021-02-03 DIAGNOSIS — D693 Immune thrombocytopenic purpura: Secondary | ICD-10-CM | POA: Diagnosis not present

## 2021-02-03 DIAGNOSIS — Z20822 Contact with and (suspected) exposure to covid-19: Secondary | ICD-10-CM | POA: Diagnosis present

## 2021-02-03 DIAGNOSIS — M810 Age-related osteoporosis without current pathological fracture: Secondary | ICD-10-CM | POA: Diagnosis present

## 2021-02-03 DIAGNOSIS — R58 Hemorrhage, not elsewhere classified: Secondary | ICD-10-CM | POA: Diagnosis not present

## 2021-02-03 DIAGNOSIS — J449 Chronic obstructive pulmonary disease, unspecified: Secondary | ICD-10-CM | POA: Diagnosis present

## 2021-02-03 DIAGNOSIS — K219 Gastro-esophageal reflux disease without esophagitis: Secondary | ICD-10-CM | POA: Diagnosis present

## 2021-02-03 DIAGNOSIS — S50311A Abrasion of right elbow, initial encounter: Secondary | ICD-10-CM | POA: Diagnosis present

## 2021-02-03 DIAGNOSIS — M4319 Spondylolisthesis, multiple sites in spine: Secondary | ICD-10-CM | POA: Diagnosis not present

## 2021-02-03 DIAGNOSIS — G309 Alzheimer's disease, unspecified: Secondary | ICD-10-CM | POA: Diagnosis present

## 2021-02-03 DIAGNOSIS — E039 Hypothyroidism, unspecified: Secondary | ICD-10-CM | POA: Diagnosis present

## 2021-02-03 DIAGNOSIS — R278 Other lack of coordination: Secondary | ICD-10-CM | POA: Diagnosis not present

## 2021-02-03 DIAGNOSIS — S0003XA Contusion of scalp, initial encounter: Secondary | ICD-10-CM | POA: Diagnosis not present

## 2021-02-03 DIAGNOSIS — R6889 Other general symptoms and signs: Secondary | ICD-10-CM | POA: Diagnosis not present

## 2021-02-03 DIAGNOSIS — J441 Chronic obstructive pulmonary disease with (acute) exacerbation: Secondary | ICD-10-CM | POA: Diagnosis not present

## 2021-02-03 DIAGNOSIS — Z981 Arthrodesis status: Secondary | ICD-10-CM | POA: Diagnosis not present

## 2021-02-03 DIAGNOSIS — M25521 Pain in right elbow: Secondary | ICD-10-CM | POA: Diagnosis not present

## 2021-02-03 DIAGNOSIS — R627 Adult failure to thrive: Secondary | ICD-10-CM

## 2021-02-03 DIAGNOSIS — S79912A Unspecified injury of left hip, initial encounter: Secondary | ICD-10-CM | POA: Diagnosis not present

## 2021-02-03 DIAGNOSIS — E440 Moderate protein-calorie malnutrition: Secondary | ICD-10-CM | POA: Diagnosis not present

## 2021-02-03 DIAGNOSIS — Z885 Allergy status to narcotic agent status: Secondary | ICD-10-CM

## 2021-02-03 DIAGNOSIS — S6991XA Unspecified injury of right wrist, hand and finger(s), initial encounter: Secondary | ICD-10-CM | POA: Diagnosis not present

## 2021-02-03 HISTORY — DX: Iron deficiency: E61.1

## 2021-02-03 HISTORY — DX: Cognitive communication deficit: R41.841

## 2021-02-03 HISTORY — DX: Chronic obstructive pulmonary disease, unspecified: J44.9

## 2021-02-03 LAB — CBC WITH DIFFERENTIAL/PLATELET
Abs Immature Granulocytes: 0.03 10*3/uL (ref 0.00–0.07)
Basophils Absolute: 0.1 10*3/uL (ref 0.0–0.1)
Basophils Relative: 1 %
Eosinophils Absolute: 0.2 10*3/uL (ref 0.0–0.5)
Eosinophils Relative: 1 %
HCT: 39.6 % (ref 36.0–46.0)
Hemoglobin: 12.3 g/dL (ref 12.0–15.0)
Immature Granulocytes: 0 %
Lymphocytes Relative: 20 %
Lymphs Abs: 2.4 10*3/uL (ref 0.7–4.0)
MCH: 29 pg (ref 26.0–34.0)
MCHC: 31.1 g/dL (ref 30.0–36.0)
MCV: 93.4 fL (ref 80.0–100.0)
Monocytes Absolute: 0.7 10*3/uL (ref 0.1–1.0)
Monocytes Relative: 6 %
Neutro Abs: 8.6 10*3/uL — ABNORMAL HIGH (ref 1.7–7.7)
Neutrophils Relative %: 72 %
Platelets: 21 10*3/uL — CL (ref 150–400)
RBC: 4.24 MIL/uL (ref 3.87–5.11)
RDW: 18.6 % — ABNORMAL HIGH (ref 11.5–15.5)
WBC: 12 10*3/uL — ABNORMAL HIGH (ref 4.0–10.5)
nRBC: 0 % (ref 0.0–0.2)

## 2021-02-03 LAB — RESP PANEL BY RT-PCR (FLU A&B, COVID) ARPGX2
Influenza A by PCR: NEGATIVE
Influenza B by PCR: NEGATIVE
SARS Coronavirus 2 by RT PCR: NEGATIVE

## 2021-02-03 LAB — BASIC METABOLIC PANEL
Anion gap: 10 (ref 5–15)
BUN: 11 mg/dL (ref 8–23)
CO2: 24 mmol/L (ref 22–32)
Calcium: 9.4 mg/dL (ref 8.9–10.3)
Chloride: 105 mmol/L (ref 98–111)
Creatinine, Ser: 0.73 mg/dL (ref 0.44–1.00)
GFR, Estimated: >60 mL/min (ref >60)
Glucose, Bld: 91 mg/dL (ref 70–99)
Potassium: 3.8 mmol/L (ref 3.5–5.1)
Sodium: 139 mmol/L (ref 135–145)

## 2021-02-03 MED ORDER — AMLODIPINE BESYLATE 5 MG PO TABS
10.0000 mg | ORAL_TABLET | Freq: Every day | ORAL | Status: DC
  Administered 2021-02-04 – 2021-02-06 (×3): 10 mg via ORAL
  Filled 2021-02-03 (×3): qty 2

## 2021-02-03 MED ORDER — ALPRAZOLAM 0.5 MG PO TABS
0.5000 mg | ORAL_TABLET | Freq: Three times a day (TID) | ORAL | Status: DC | PRN
  Administered 2021-02-04 – 2021-02-06 (×2): 0.5 mg via ORAL
  Filled 2021-02-03 (×2): qty 1

## 2021-02-03 MED ORDER — BUSPIRONE HCL 5 MG PO TABS
7.5000 mg | ORAL_TABLET | Freq: Two times a day (BID) | ORAL | Status: DC
  Administered 2021-02-03 – 2021-02-06 (×6): 7.5 mg via ORAL
  Filled 2021-02-03 (×6): qty 2

## 2021-02-03 MED ORDER — ONDANSETRON HCL 4 MG/2ML IJ SOLN: 4.0000 mg | Freq: Four times a day (QID) | INTRAMUSCULAR | Status: DC | PRN

## 2021-02-03 MED ORDER — LAMOTRIGINE 25 MG PO TABS
50.0000 mg | ORAL_TABLET | Freq: Every day | ORAL | Status: DC
  Administered 2021-02-03 – 2021-02-05 (×3): 50 mg via ORAL
  Filled 2021-02-03 (×3): qty 2

## 2021-02-03 MED ORDER — FLUTICASONE PROPIONATE 50 MCG/ACT NA SUSP: 2.0000 | Freq: Every day | NASAL | Status: DC | PRN

## 2021-02-03 MED ORDER — GABAPENTIN 100 MG PO CAPS
200.0000 mg | ORAL_CAPSULE | Freq: Four times a day (QID) | ORAL | Status: DC
  Administered 2021-02-03 – 2021-02-06 (×10): 200 mg via ORAL
  Filled 2021-02-03 (×11): qty 2

## 2021-02-03 MED ORDER — ONDANSETRON HCL 4 MG PO TABS: 4.0000 mg | ORAL_TABLET | Freq: Four times a day (QID) | ORAL | Status: DC | PRN

## 2021-02-03 MED ORDER — ACETAMINOPHEN 325 MG PO TABS
650.0000 mg | ORAL_TABLET | Freq: Four times a day (QID) | ORAL | Status: DC | PRN
  Administered 2021-02-03 – 2021-02-05 (×2): 650 mg via ORAL
  Filled 2021-02-03 (×2): qty 2

## 2021-02-03 MED ORDER — ACETAMINOPHEN 650 MG RE SUPP: 650.0000 mg | Freq: Four times a day (QID) | RECTAL | Status: DC | PRN

## 2021-02-03 MED ORDER — ROSUVASTATIN CALCIUM 10 MG PO TABS
5.0000 mg | ORAL_TABLET | Freq: Every day | ORAL | Status: DC
  Administered 2021-02-04 – 2021-02-06 (×3): 5 mg via ORAL
  Filled 2021-02-03 (×3): qty 1

## 2021-02-03 MED ORDER — CITALOPRAM HYDROBROMIDE 20 MG PO TABS
20.0000 mg | ORAL_TABLET | Freq: Every day | ORAL | Status: DC
  Administered 2021-02-04 – 2021-02-06 (×3): 20 mg via ORAL
  Filled 2021-02-03 (×3): qty 1

## 2021-02-03 MED ORDER — MONTELUKAST SODIUM 10 MG PO TABS
10.0000 mg | ORAL_TABLET | Freq: Every day | ORAL | Status: DC
  Administered 2021-02-03 – 2021-02-05 (×3): 10 mg via ORAL
  Filled 2021-02-03 (×3): qty 1

## 2021-02-03 MED ORDER — OXYBUTYNIN CHLORIDE 5 MG PO TABS
5.0000 mg | ORAL_TABLET | Freq: Three times a day (TID) | ORAL | Status: DC
  Administered 2021-02-03 – 2021-02-06 (×9): 5 mg via ORAL
  Filled 2021-02-03 (×9): qty 1

## 2021-02-03 MED ORDER — VITAMIN E 180 MG (400 UNIT) PO CAPS
400.0000 [IU] | ORAL_CAPSULE | Freq: Every day | ORAL | Status: DC
  Administered 2021-02-04 – 2021-02-06 (×3): 400 [IU] via ORAL
  Filled 2021-02-03 (×3): qty 1

## 2021-02-03 MED ORDER — ROMIPLOSTIM INJECTION 500 MCG
10.0000 ug/kg | Freq: Once | SUBCUTANEOUS | Status: AC
  Administered 2021-02-03: 550 ug via SUBCUTANEOUS
  Filled 2021-02-03: qty 1.1

## 2021-02-03 MED ORDER — VITAMIN B-12 1000 MCG PO TABS
1000.0000 ug | ORAL_TABLET | Freq: Every day | ORAL | Status: DC
  Administered 2021-02-04 – 2021-02-06 (×3): 1000 ug via ORAL
  Filled 2021-02-03 (×3): qty 1

## 2021-02-03 MED ORDER — ADULT MULTIVITAMIN W/MINERALS CH
1.0000 | ORAL_TABLET | Freq: Every day | ORAL | Status: DC
  Administered 2021-02-04 – 2021-02-06 (×3): 1 via ORAL
  Filled 2021-02-03 (×3): qty 1

## 2021-02-03 MED ORDER — VITAMIN D 25 MCG (1000 UNIT) PO TABS
2000.0000 [IU] | ORAL_TABLET | Freq: Every day | ORAL | Status: DC
  Administered 2021-02-04 – 2021-02-06 (×3): 2000 [IU] via ORAL
  Filled 2021-02-03 (×3): qty 2

## 2021-02-03 MED ORDER — LORATADINE 10 MG PO TABS
10.0000 mg | ORAL_TABLET | Freq: Every day | ORAL | Status: DC
  Administered 2021-02-04 – 2021-02-06 (×3): 10 mg via ORAL
  Filled 2021-02-03 (×3): qty 1

## 2021-02-03 MED ORDER — MEMANTINE HCL 10 MG PO TABS
10.0000 mg | ORAL_TABLET | Freq: Two times a day (BID) | ORAL | Status: DC
  Administered 2021-02-03 – 2021-02-06 (×6): 10 mg via ORAL
  Filled 2021-02-03 (×6): qty 1

## 2021-02-03 MED ORDER — FLUTICASONE FUROATE-VILANTEROL 200-25 MCG/INH IN AEPB
1.0000 | INHALATION_SPRAY | Freq: Every day | RESPIRATORY_TRACT | Status: DC
  Administered 2021-02-04 – 2021-02-06 (×3): 1 via RESPIRATORY_TRACT
  Filled 2021-02-03: qty 28

## 2021-02-03 MED ORDER — LEVOTHYROXINE SODIUM 50 MCG PO TABS
50.0000 ug | ORAL_TABLET | Freq: Every day | ORAL | Status: DC
  Administered 2021-02-04 – 2021-02-06 (×3): 50 ug via ORAL
  Filled 2021-02-03 (×3): qty 1

## 2021-02-03 MED ORDER — POLYSACCHARIDE IRON COMPLEX 150 MG PO CAPS
150.0000 mg | ORAL_CAPSULE | Freq: Every day | ORAL | Status: DC
  Administered 2021-02-04 – 2021-02-06 (×3): 150 mg via ORAL
  Filled 2021-02-03 (×5): qty 1

## 2021-02-03 MED ORDER — PANTOPRAZOLE SODIUM 20 MG PO TBEC
20.0000 mg | DELAYED_RELEASE_TABLET | Freq: Two times a day (BID) | ORAL | Status: DC
  Filled 2021-02-03 (×4): qty 1
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
## 2021-02-03 NOTE — Progress Notes (Signed)
10:30 PM RN called to ask if patient could have a diet order.  Patient's chart was reviewed and it was noted that she was on aspiration precaution.  Bedside swallow eval ordered and patient passed.  Diet order placed with recommendation for aspiration precaution.

## 2021-02-03 NOTE — ED Provider Notes (Signed)
Emergency Department Provider Note   I have reviewed the triage vital signs and the nursing notes.   HISTORY  Chief Complaint Fall   HPI Ariana [name] is a 87 y.o. female with past medical history reviewed below including dementia, COPD, ITP presents to the emergency department from a skilled nursing facility after a fall.  Patient apparently rolled out of bed.  According to staff the bed height was low but she sustained some bleeding to the right hand and right elbow.  She has been at her baseline mental status per EMS/SNF team.  Patient mainly complaining of pain in her lower back.   Level 5 caveat: Dementia    Past Medical History:  Diagnosis Date  . Alzheimer disease ([place])   . Anemia   . Anxiety   . Asthma    WELL CONTROLLED  . Chronic back pain   . Cognitive communication deficit   . COPD (chronic obstructive pulmonary disease) ([place])   . Depression   . Depression   . GERD (gastroesophageal reflux disease)   . Hiatal hernia   . Hypercholesteremia   . Hypothyroidism   . Iron deficiency   . ITP (idiopathic thrombocytopenic purpura)    FOLLOWED BR DR Grayland Ormond  . LBBB (left bundle branch block)   . Osteoarthritis   . Osteoporosis   . Restless legs     Patient Active Problem List   Diagnosis Date Noted  . Fall   . Malnutrition of moderate degree 01/09/2021  . Scalp hematoma 01/05/2021  . Palliative care encounter   . Closed displaced fracture of left femoral neck ([place]) 10/30/2020  . Hiatal hernia   . GERD (gastroesophageal reflux disease)   . Rectal bleeding 08/18/2020  . Leukocytosis 08/18/2020  . Failure to thrive in adult 02/16/2020  . Thrombocytopenia ([place]) 02/15/2020  . Pneumonia 07/10/2019  . Laceration of left lower leg 04/10/2019  . Chronic pain disorder 01/11/2019  . OSA (obstructive sleep apnea) 09/28/2018  . Ulcer of lower limb, right, limited to breakdown of skin ([place]) 07/22/2018  . Traumatic hematoma of right lower leg 07/12/2018  .  Asthma without status asthmaticus 11/25/2016  . Late onset Alzheimer's disease without behavioral disturbance ([place]) 12/30/2015  . Back muscle spasm 09/09/2015  . Clinical depression 04/12/2015  . Herpes zona 04/12/2015  . Acute ITP ([place]) 03/15/2015  . Chest pain 10/22/2014  . Breathlessness on exertion 10/22/2014  . Osteopenia 10/18/2014  . Benign essential hypertension 06/28/2014  . HLD (hyperlipidemia) 06/28/2014  . Idiopathic thrombocythemia ([place]) 06/28/2014  . Acquired hypothyroidism 06/04/2014  . Depressive disorder 06/04/2014  . Bursitis, trochanteric 03/27/2014  . DDD (degenerative disc disease), lumbar 02/27/2014  . Neuritis or radiculitis due to rupture of lumbar intervertebral disc 02/27/2014  . Lumbar radiculitis 02/27/2014    Past Surgical History:  Procedure Laterality Date  . CARPAL TUNNEL RELEASE    . ESOPHAGOGASTRODUODENOSCOPY (EGD) WITH PROPOFOL N/A 09/10/2015   Procedure: ESOPHAGOGASTRODUODENOSCOPY (EGD) WITH PROPOFOL;  Surgeon: Lollie Sails, MD;  Location: [facility];  Service: Endoscopy;  Laterality: N/A;  . HIP PINNING,CANNULATED Left 10/31/2020   Procedure: CANNULATED HIP PINNING;  Surgeon: Leim Fabry, MD;  Location: [facility] ORS;  Service: Orthopedics;  Laterality: Left;  . IRRIGATION AND DEBRIDEMENT HEMATOMA Right 07/13/2018   Procedure: IRRIGATION AND DEBRIDEMENT HEMATOMA-RIGHT SHIN;  Surgeon: Herbert Pun, MD;  Location: [facility] ORS;  Service: General;  Laterality: Right;  . KYPHOPLASTY N/A 05/02/2019   Procedure: L1 KYPHOPLASTY;  Surgeon: Hessie Knows, MD;  Location: [facility] ORS;  Service: Orthopedics;  Laterality: N/A;  . LUMBAR [place]    . REVERSE SHOULDER ARTHROPLASTY Left 06/25/2020   Procedure: REVERSE SHOULDER ARTHROPLASTY;  Surgeon: Corky Mull, MD;  Location: [facility] ORS;  Service: Orthopedics;  Laterality: Left;  . SPLENECTOMY, PARTIAL    . TOTAL ABDOMINAL HYSTERECTOMY      Allergies Aspirin, Diazepam, Other, Tetanus toxoids, and  Morphine  Family History  Problem Relation Age of Onset  . Stroke Mother     Social History Social History   Tobacco Use  . Smoking status: Never Smoker  . Smokeless tobacco: Never Used  Vaping Use  . Vaping Use: Never used  Substance Use Topics  . Alcohol use: No    Alcohol/week: 0.0 standard drinks  . Drug use: No    Review of Systems  Constitutional: No fever/chills Eyes: No visual changes. ENT: No sore throat. Cardiovascular: Denies chest pain. Respiratory: Denies shortness of breath. Gastrointestinal: No abdominal pain.  No nausea, no vomiting.  No diarrhea.  No constipation. Genitourinary: Negative for dysuria. Musculoskeletal: Positive for back pain. Skin: Negative for rash. Abrasions to hand and elbow (right).  Neurological: Negative for focal weakness or numbness. Positive HA.   10-point ROS otherwise negative.  ____________________________________________   PHYSICAL EXAM:  VITAL SIGNS: BP: 128/61 Temp: 98.62F  Pulse: 61 Resp: 17 SpO2: 100% RA  Constitutional: Alert but confused. Participating in exam and following commands. Well appearing and in no acute distress. Eyes: Conjunctivae are normal. PERRL.  Head: Atraumatic. Nose: No congestion/rhinnorhea. Mouth/Throat: Mucous membranes are moist.  Neck: No stridor.  No cervical spine tenderness to palpation. Cardiovascular: Normal rate, regular rhythm. Good peripheral circulation. Grossly normal heart sounds.   Respiratory: Normal respiratory effort.  No retractions. Lungs CTAB. Gastrointestinal: Soft and nontender. No distention.  Musculoskeletal: No lower extremity tenderness nor edema. No gross deformities of extremities. Mild midline and paraspinal lumbar spine tenderness. Normal passive ROM of the bilateral hips with some mild apparent discomfort with left hip ROM but tolerates full ROM. No scaphoid or other tenderness to the bilateral wrists.  Neurologic:  Normal speech and language. No gross focal  neurologic deficits are appreciated.  Skin:  Skin is warm abrasions with some bleeding to the right elbow and right hand at the base of the 5th digit.  The wound to the base of the right hand is consistent with skin tear.  The skin here is actually fairly thin and the flap seems somewhat denuded.  Unsure that it would hold a stitch well.   ____________________________________________   LABS (all labs ordered are listed, but only abnormal results are displayed)  Labs Reviewed  CBC WITH DIFFERENTIAL/PLATELET - Abnormal; Notable for the following components:      Result Value   WBC 12.0 (*)    RDW 18.6 (*)    Platelets 21 (*)    Neutro Abs 8.6 (*)    All other components within normal limits  CBC WITH DIFFERENTIAL/PLATELET - Abnormal; Notable for the following components:   Hemoglobin 11.6 (*)    RDW 18.6 (*)    Platelets 19 (*)    All other components within normal limits  RESP PANEL BY RT-PCR (FLU A&B, COVID) ARPGX2  BASIC METABOLIC PANEL  COMPREHENSIVE METABOLIC PANEL   ____________________________________________  RADIOLOGY  DG Elbow Complete Right  Result Date: 02/03/2021 CLINICAL DATA:  Fall from bed this morning with right elbow pain EXAM: RIGHT ELBOW - COMPLETE 3+ VIEW COMPARISON:  None. FINDINGS: No fracture or dislocation. Suboptimal lateral positioning  limits evaluation for a joint effusion, with no gross evidence of joint effusion. No focal osseous lesions. No significant arthropathy. No radiopaque foreign bodies. IMPRESSION: No right elbow fracture or dislocation. Suboptimal lateral positioning limits evaluation for a joint effusion, with no gross evidence of joint effusion. Electronically Signed   By: Ilona Sorrel M.D.   On: 02/03/2021 09:22   CT Head Wo Contrast  Result Date: 02/03/2021 CLINICAL DATA:  87 year old female status post fall out of bed. Pain. EXAM: CT HEAD WITHOUT CONTRAST TECHNIQUE: Contiguous axial images were obtained from the base of the skull through  the vertex without intravenous contrast. COMPARISON:  Brain MRI 05/16/2014.  Head CT 01/05/2021. FINDINGS: Brain: Stable cerebral volume. No midline shift, ventriculomegaly, mass effect, evidence of mass lesion, intracranial hemorrhage or evidence of cortically based acute infarction. Widespread Patchy and confluent white matter hypodensity appears stable since March along with heterogeneity in the deep gray nuclei. Stable gray-white matter differentiation throughout the brain. Vascular: Calcified atherosclerosis at the skull base. No suspicious intracranial vascular hyperdensity. Skull: Stable and intact. Sinuses/Orbits: Visualized paranasal sinuses and mastoids are clear. Other: Mostly resolved posterior scalp hematoma demonstrated in March. No new scalp or orbits soft tissue injury identified. IMPRESSION: No acute intracranial abnormality or acute traumatic injury. Chronic small vessel disease. Electronically Signed   By: Genevie Ann M.D.   On: 02/03/2021 09:34   CT Cervical Spine Wo Contrast  Result Date: 02/03/2021 CLINICAL DATA:  87 year old female status post fall out of bed. Pain. EXAM: CT CERVICAL SPINE WITHOUT CONTRAST TECHNIQUE: Multidetector CT imaging of the cervical spine was performed without intravenous contrast. Multiplanar CT image reconstructions were also generated. COMPARISON:  Head CT today.  Cervical spine CT 01/05/2021. FINDINGS: Alignment: Stable since March. Straightening of cervical lordosis with mild chronic anterolisthesis of C2 on C3 and C7 on T1. Bilateral posterior element alignment is within normal limits. Skull base and vertebrae: Visualized skull base is intact. No atlanto-occipital dissociation. C1 and C2 appear stable and intact. No acute osseous abnormality identified. Soft tissues and spinal canal: No prevertebral fluid or swelling. No visible canal hematoma. Calcified carotid atherosclerosis. Disc levels: Widespread severe cervical spine degeneration, including at the anterior  C1-C2 articulation, appears stable. Upper chest: Visible upper thoracic levels appear stable allowing for mild motion today. Negative lung apices. Other: None. IMPRESSION: 1. No acute traumatic injury identified in the cervical spine. 2. Stable advanced cervical spine degeneration. Electronically Signed   By: Genevie Ann M.D.   On: 02/03/2021 09:40   CT Lumbar Spine Wo Contrast  Result Date: 02/03/2021 CLINICAL DATA:  87 year old female status post fall out of bed. Pain. EXAM: CT LUMBAR SPINE WITHOUT CONTRAST TECHNIQUE: Multidetector CT imaging of the lumbar spine was performed without intravenous contrast administration. Multiplanar CT image reconstructions were also generated. COMPARISON:  Lumbar spine CT 04/27/2019. CTA abdomen and pelvis 08/18/2020. FINDINGS: Segmentation: Transitional anatomy, partially sacralized L5 which is the same numbering system used in 2020. Alignment: Lumbar lordosis not significantly changed since 2020 and stable since November. Vertebrae: Previously augmented L1 compression fracture appears stable since November. Postoperative details at L4-L5 are below. No superimposed acute osseous abnormality identified. Stable bone mineralization. Visible sacrum and SI joints appear intact. Paraspinal and other soft tissues: Stable, including surgical clips in the left upper quadrant and diverticulosis of the large bowel. Disc levels: Chronic severe L2-L3 disc and endplate degeneration appears stable since 2020. Chronic L3-L4 posterior decompression appear stable since 2020. Prior decompression and fusion at L4-L5 with stable bilateral  pedicle and interbody hardware. Solid-appearing posterior element arthrodesis. IMPRESSION: Stable CT appearance of the lumbar spine since last year. No acute traumatic injury identified. Electronically Signed   By: Odessa Fleming M.D.   On: 02/03/2021 09:47   DG Hand Complete Right  Result Date: 02/03/2021 CLINICAL DATA:  Fall from bed this morning with right hip pain  EXAM: RIGHT HAND - COMPLETE 3+ VIEW COMPARISON:  None. FINDINGS: No fracture or dislocation. No suspicious focal osseous lesions. Moderate first carpometacarpal joint osteoarthritis. Mild interphalangeal joint right thumb osteoarthritis. Moderate osteoarthritis throughout the interphalangeal joints of the second through fifth fingers, most prominent in the second DIP joint with associated central erosive component. No radiopaque foreign bodies. IMPRESSION: 1. No fracture or dislocation in the right hand. 2. Moderate polyarticular erosive osteoarthritis. Electronically Signed   By: Delbert Phenix M.D.   On: 02/03/2021 09:24   DG Hip Unilat W or Wo Pelvis 2-3 Views Left  Result Date: 02/03/2021 CLINICAL DATA:  Fall from bed this morning with pain. Fixation of left proximal femur fracture in January. EXAM: DG HIP (WITH OR WITHOUT PELVIS) 2-3V LEFT COMPARISON:  10/30/2020 left hip radiographs FINDINGS: Intact pins transfix healed deformity in the subcapital left femoral neck. No acute fracture. No left hip dislocation. No pelvic diastasis. Bilateral posterior spinal fusion hardware in the lower lumbar spine. No suspicious focal osseous lesions. Mild bilateral hip osteoarthritis. IMPRESSION: No acute osseous abnormality. ORIF healed subcapital left femoral neck fracture with no hardware complication. Mild bilateral hip osteoarthritis. Electronically Signed   By: Delbert Phenix M.D.   On: 02/03/2021 09:27    ____________________________________________   PROCEDURES  Procedure(s) performed:   Marland KitchenMarland KitchenLaceration Repair  Date/Time: 02/04/2021 7:24 AM Performed by: Maia Plan, MD Authorized by: Maia Plan, MD   Consent:    Consent obtained:  Verbal   Consent given by:  Patient Universal protocol:    Patient identity confirmed:  Verbally with patient and arm band Anesthesia:    Anesthesia method:  None Laceration details:    Location:  Hand   Hand location:  R palm   Length (cm):  2 Pre-procedure  details:    Preparation:  Patient was prepped and draped in usual sterile fashion and imaging obtained to evaluate for foreign bodies Exploration:    Limited defect created (wound extended): no     Hemostasis achieved with:  Direct pressure (continued oozing blood with ITP history )   Imaging obtained: x-ray     Imaging outcome: foreign body not noted     Wound exploration: wound explored through full range of motion and entire depth of wound visualized     Wound extent: no foreign bodies/material noted, no tendon damage noted, no underlying fracture noted and no vascular damage noted     Contaminated: no   Treatment:    Area cleansed with:  Povidone-iodine   Amount of cleaning:  Standard   Irrigation solution:  Sterile saline   Debridement:  None Skin repair:    Repair method:  Tissue adhesive Approximation:    Approximation:  Loose Post-procedure details:    Dressing:  Bulky dressing   Procedure completion:  Tolerated well, no immediate complications     ____________________________________________   INITIAL IMPRESSION / ASSESSMENT AND PLAN / ED COURSE  Pertinent labs & imaging results that were available during my care of the patient were reviewed by me and considered in my medical decision making (see chart for details).   Patient with history of ITP presents  from her nursing facility after a fall out of bed.  She has abrasions to the right hand and right elbow and complaining mainly of lower back pain.  Some discomfort with range of motion of the left hip.  Plan for plain films of the areas of apparent injury as well as CT scans of the head and cervical spine along with lumbar spine given his age, risk factors for bleeding (ITP), and areas of pain.   01:49 PM  Woke with Dr. Delton Coombes regarding the case.  She has some oozing from skin tears on the hand continues.  Platelet count of 21.  Recommends Nplate SQ once and can observe overnight to ensure platelet counts and bleeding  stops.   Discussed patient's case with [facility] to request admission. Patient and family (if present) updated with plan. Care transferred to [facility] service.  I reviewed all nursing notes, vitals, pertinent old records, EKGs, labs, imaging (as available).  ____________________________________________  FINAL CLINICAL IMPRESSION(S) / ED DIAGNOSES  Final diagnoses:  Fall, initial encounter  Skin tear of right hand without complication, initial encounter  Thrombocytopenia ([place])     MEDICATIONS GIVEN DURING THIS VISIT:  Medications  rosuvastatin (CRESTOR) tablet 5 mg (5 mg Oral Not Given 02/03/21 2249)  ALPRAZolam (XANAX) tablet 0.5 mg (has no administration in time range)  busPIRone (BUSPAR) tablet 7.5 mg (7.5 mg Oral Given 02/03/21 2146)  citalopram (CELEXA) tablet 20 mg (20 mg Oral Not Given 02/03/21 2248)  memantine (NAMENDA) tablet 10 mg (10 mg Oral Given 02/03/21 2147)  levothyroxine (SYNTHROID) tablet 50 mcg (50 mcg Oral Given 02/04/21 0625)  pantoprazole (PROTONIX) EC tablet 20 mg (20 mg Oral Not Given 02/03/21 2249)  vitamin B-12 (CYANOCOBALAMIN) tablet 1,000 mcg (1,000 mcg Oral Not Given 02/03/21 2249)  iron polysaccharides (NIFEREX) capsule 150 mg (150 mg Oral Not Given 02/03/21 2248)  lamoTRIgine (LAMICTAL) tablet 50 mg (50 mg Oral Given 02/03/21 2148)  cholecalciferol (VITAMIN D3) tablet 2,000 Units (2,000 Units Oral Not Given 02/03/21 2248)  multivitamin with minerals tablet 1 tablet (1 tablet Oral Not Given 02/03/21 2249)  vitamin E capsule 400 Units (400 Units Oral Not Given 02/03/21 2249)  fluticasone (FLONASE) 50 MCG/ACT nasal spray 2 spray (has no administration in time range)  fluticasone furoate-vilanterol (BREO ELLIPTA) 200-25 MCG/INH 1 puff (has no administration in time range)  loratadine (CLARITIN) tablet 10 mg (10 mg Oral Not Given 02/03/21 2249)  montelukast (SINGULAIR) tablet 10 mg (10 mg Oral Given 02/03/21 2148)  amLODipine (NORVASC) tablet 10 mg (10 mg Oral Not Given 02/03/21  2248)  oxybutynin (DITROPAN) tablet 5 mg (5 mg Oral Given 02/03/21 2147)  gabapentin (NEURONTIN) capsule 200 mg (200 mg Oral Given 02/03/21 2147)  acetaminophen (TYLENOL) tablet 650 mg (650 mg Oral Given 02/03/21 2147)    Or  acetaminophen (TYLENOL) suppository 650 mg ( Rectal See Alternative 02/03/21 2147)  ondansetron (ZOFRAN) tablet 4 mg (has no administration in time range)    Or  ondansetron (ZOFRAN) injection 4 mg (has no administration in time range)  romiPLOStim (NPLATE) injection 550 mcg (550 mcg Subcutaneous Given 02/03/21 1611)    Note:  This document was prepared using Dragon voice recognition software and may include unintentional dictation errors.  Nanda Quinton, MD, [facility] Emergency Medicine    Chinedu Agustin, [facility], MD 02/04/21 [phone]

## 2021-02-03 NOTE — ED Triage Notes (Signed)
Pt brought in by [facility] from [facility] with c/o falling out of her bed this morning. Pt c/o pain to back, back of head, and right hand.

## 2021-02-03 NOTE — ED Notes (Signed)
Pt dressing soiled on right hand. Pt dressing changed, non stick telfa applied and wrapped with kurlex then coban. Pt tolerated well.

## 2021-02-04 DIAGNOSIS — E785 Hyperlipidemia, unspecified: Secondary | ICD-10-CM | POA: Diagnosis present

## 2021-02-04 DIAGNOSIS — D473 Essential (hemorrhagic) thrombocythemia: Secondary | ICD-10-CM

## 2021-02-04 DIAGNOSIS — D693 Immune thrombocytopenic purpura: Secondary | ICD-10-CM | POA: Diagnosis present

## 2021-02-04 DIAGNOSIS — D509 Iron deficiency anemia, unspecified: Secondary | ICD-10-CM | POA: Diagnosis present

## 2021-02-04 DIAGNOSIS — J449 Chronic obstructive pulmonary disease, unspecified: Secondary | ICD-10-CM | POA: Diagnosis present

## 2021-02-04 DIAGNOSIS — S61411A Laceration without foreign body of right hand, initial encounter: Secondary | ICD-10-CM | POA: Diagnosis present

## 2021-02-04 DIAGNOSIS — F32A Depression, unspecified: Secondary | ICD-10-CM | POA: Diagnosis present

## 2021-02-04 DIAGNOSIS — Z823 Family history of stroke: Secondary | ICD-10-CM | POA: Diagnosis not present

## 2021-02-04 DIAGNOSIS — F05 Delirium due to known physiological condition: Secondary | ICD-10-CM | POA: Diagnosis present

## 2021-02-04 DIAGNOSIS — R41841 Cognitive communication deficit: Secondary | ICD-10-CM | POA: Diagnosis not present

## 2021-02-04 DIAGNOSIS — E7800 Pure hypercholesterolemia, unspecified: Secondary | ICD-10-CM | POA: Diagnosis present

## 2021-02-04 DIAGNOSIS — F0280 Dementia in other diseases classified elsewhere without behavioral disturbance: Secondary | ICD-10-CM | POA: Diagnosis present

## 2021-02-04 DIAGNOSIS — D696 Thrombocytopenia, unspecified: Secondary | ICD-10-CM

## 2021-02-04 DIAGNOSIS — I10 Essential (primary) hypertension: Secondary | ICD-10-CM | POA: Diagnosis present

## 2021-02-04 DIAGNOSIS — Y92092 Bedroom in other non-institutional residence as the place of occurrence of the external cause: Secondary | ICD-10-CM | POA: Diagnosis not present

## 2021-02-04 DIAGNOSIS — Z20822 Contact with and (suspected) exposure to covid-19: Secondary | ICD-10-CM | POA: Diagnosis present

## 2021-02-04 DIAGNOSIS — S60511A Abrasion of right hand, initial encounter: Secondary | ICD-10-CM | POA: Diagnosis present

## 2021-02-04 DIAGNOSIS — G2581 Restless legs syndrome: Secondary | ICD-10-CM | POA: Diagnosis present

## 2021-02-04 DIAGNOSIS — M810 Age-related osteoporosis without current pathological fracture: Secondary | ICD-10-CM | POA: Diagnosis present

## 2021-02-04 DIAGNOSIS — K219 Gastro-esophageal reflux disease without esophagitis: Secondary | ICD-10-CM | POA: Diagnosis present

## 2021-02-04 DIAGNOSIS — R296 Repeated falls: Secondary | ICD-10-CM | POA: Diagnosis present

## 2021-02-04 DIAGNOSIS — F419 Anxiety disorder, unspecified: Secondary | ICD-10-CM | POA: Diagnosis present

## 2021-02-04 DIAGNOSIS — W19XXXA Unspecified fall, initial encounter: Secondary | ICD-10-CM | POA: Diagnosis not present

## 2021-02-04 DIAGNOSIS — E039 Hypothyroidism, unspecified: Secondary | ICD-10-CM | POA: Diagnosis present

## 2021-02-04 DIAGNOSIS — J441 Chronic obstructive pulmonary disease with (acute) exacerbation: Secondary | ICD-10-CM | POA: Diagnosis not present

## 2021-02-04 DIAGNOSIS — S50311A Abrasion of right elbow, initial encounter: Secondary | ICD-10-CM | POA: Diagnosis present

## 2021-02-04 DIAGNOSIS — D649 Anemia, unspecified: Secondary | ICD-10-CM | POA: Diagnosis not present

## 2021-02-04 DIAGNOSIS — R278 Other lack of coordination: Secondary | ICD-10-CM | POA: Diagnosis not present

## 2021-02-04 DIAGNOSIS — M6281 Muscle weakness (generalized): Secondary | ICD-10-CM | POA: Diagnosis not present

## 2021-02-04 DIAGNOSIS — E440 Moderate protein-calorie malnutrition: Secondary | ICD-10-CM | POA: Diagnosis not present

## 2021-02-04 DIAGNOSIS — Z9081 Acquired absence of spleen: Secondary | ICD-10-CM | POA: Diagnosis not present

## 2021-02-04 DIAGNOSIS — G4733 Obstructive sleep apnea (adult) (pediatric): Secondary | ICD-10-CM | POA: Diagnosis present

## 2021-02-04 DIAGNOSIS — Z66 Do not resuscitate: Secondary | ICD-10-CM | POA: Diagnosis present

## 2021-02-04 DIAGNOSIS — W06XXXA Fall from bed, initial encounter: Secondary | ICD-10-CM | POA: Diagnosis present

## 2021-02-04 DIAGNOSIS — G309 Alzheimer's disease, unspecified: Secondary | ICD-10-CM | POA: Diagnosis present

## 2021-02-04 LAB — CBC WITH DIFFERENTIAL/PLATELET
Abs Immature Granulocytes: 0.03 10*3/uL (ref 0.00–0.07)
Basophils Absolute: 0.1 10*3/uL (ref 0.0–0.1)
Basophils Relative: 1 %
Eosinophils Absolute: 0.1 10*3/uL (ref 0.0–0.5)
Eosinophils Relative: 2 %
HCT: 36.9 % (ref 36.0–46.0)
Hemoglobin: 11.6 g/dL — ABNORMAL LOW (ref 12.0–15.0)
Immature Granulocytes: 0 %
Lymphocytes Relative: 26 %
Lymphs Abs: 2.2 10*3/uL (ref 0.7–4.0)
MCH: 28.9 pg (ref 26.0–34.0)
MCHC: 31.4 g/dL (ref 30.0–36.0)
MCV: 92 fL (ref 80.0–100.0)
Monocytes Absolute: 0.7 10*3/uL (ref 0.1–1.0)
Monocytes Relative: 9 %
Neutro Abs: 5.3 10*3/uL (ref 1.7–7.7)
Neutrophils Relative %: 62 %
Platelets: 19 10*3/uL — CL (ref 150–400)
RBC: 4.01 MIL/uL (ref 3.87–5.11)
RDW: 18.6 % — ABNORMAL HIGH (ref 11.5–15.5)
WBC: 8.5 10*3/uL (ref 4.0–10.5)
nRBC: 0 % (ref 0.0–0.2)

## 2021-02-04 LAB — COMPREHENSIVE METABOLIC PANEL
ALT: 16 U/L (ref 0–44)
AST: 21 U/L (ref 15–41)
Albumin: 3.5 g/dL (ref 3.5–5.0)
Alkaline Phosphatase: 63 U/L (ref 38–126)
Anion gap: 11 (ref 5–15)
BUN: 17 mg/dL (ref 8–23)
CO2: 24 mmol/L (ref 22–32)
Calcium: 9.2 mg/dL (ref 8.9–10.3)
Chloride: 102 mmol/L (ref 98–111)
Creatinine, Ser: 0.81 mg/dL (ref 0.44–1.00)
GFR, Estimated: >60 mL/min (ref >60)
Glucose, Bld: 73 mg/dL (ref 70–99)
Potassium: 4 mmol/L (ref 3.5–5.1)
Sodium: 137 mmol/L (ref 135–145)
Total Bilirubin: 0.6 mg/dL (ref 0.3–1.2)
Total Protein: 6.8 g/dL (ref 6.5–8.1)

## 2021-02-04 MED ORDER — LORAZEPAM 2 MG/ML IJ SOLN
1.0000 mg | Freq: Once | INTRAMUSCULAR | Status: AC
  Administered 2021-02-04: 1 mg via INTRAVENOUS
  Filled 2021-02-04: qty 1

## 2021-02-04 MED ORDER — PANTOPRAZOLE SODIUM 40 MG PO TBEC
40.0000 mg | DELAYED_RELEASE_TABLET | Freq: Every day | ORAL | Status: DC
  Administered 2021-02-04 – 2021-02-06 (×3): 40 mg via ORAL
  Filled 2021-02-04 (×3): qty 1

## 2021-02-04 MED ORDER — IMMUNE GLOBULIN (HUMAN) 20 GM/200ML IV SOLN
1.0000 g/kg | INTRAVENOUS | Status: AC
  Administered 2021-02-04 – 2021-02-05 (×2): 55 g via INTRAVENOUS
  Filled 2021-02-04 (×2): qty 600

## 2021-02-04 NOTE — Progress Notes (Signed)
Date and time results received: 02/04/21 0729   Test: PLT Critical Value: 19  Name of Provider Notified: Dr. Carles Collet  Orders Received? Or Actions Taken?: no orders given at this time

## 2021-02-04 NOTE — Progress Notes (Signed)
Patient confused to place, time, and situation became very agitated and aggressive toward staff. Staff was in her room for approximately 1.5 hours consistently trying to get patient back to bed but patient was determined she was going home. Multiple measures taken to redirect patient and calm her down but all were unsuccessful. PRN Xanax was given in attempts to calm patient but that was unsuccessful as well. Contacted the MD for something to assist with calming the patient and getting her back to her room.
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
## 2021-02-04 NOTE — Consult Note (Signed)
[facility] Consultation Oncology  Name: Ariana [name]      MRN: [id number]    Location: [id number]  Date: 02/04/2021 Time:5:52 PM   REFERRING PHYSICIAN: Dr. Carles Collet  REASON FOR CONSULT: Severe thrombocytopenia   DIAGNOSIS: Chronic ITP with acute exacerbations  HISTORY OF PRESENT ILLNESS: Ariana [name] is a 87 year old white female seen in consultation today at the request of Dr. Carles Collet for severe thrombocytopenia.  She has a history of chronic ITP and was seen by Dr. Grayland Ormond in January of this year for acute exacerbation.  When she presented to ER yesterday on 02/03/2021, she was found to have a platelet count of 21 with hemoglobin of 12.3.  Her prior platelet count on 01/17/2021 was 675.  She had prior treatments with prednisone, WinRho and rituximab and had poor response.  She had splenectomy done in 2007.  She received IVIG and Nplate while at [facility] with significant improvement of her platelets.  She was reportedly receiving maximum dose of Nplate at 10 mcg/kg.  She reportedly fell from bed at the nursing home and had some bleeding with right hand and elbow.  She has received 550 mcg of Nplate in the ER yesterday.  Her platelet count today remained low at 19 K.  Hemoglobin dropped slightly to 11.6 from 12.3 yesterday.  She is not able to provide much history because of dementia.  She does not report any pains.  PAST MEDICAL HISTORY:   Past Medical History:  Diagnosis Date  . Alzheimer disease ([place])   . Anemia   . Anxiety   . Asthma    WELL CONTROLLED  . Chronic back pain   . Cognitive communication deficit   . COPD (chronic obstructive pulmonary disease) ([place])   . Depression   . Depression   . GERD (gastroesophageal reflux disease)   . Hiatal hernia   . Hypercholesteremia   . Hypothyroidism   . Iron deficiency   . ITP (idiopathic thrombocytopenic purpura)    FOLLOWED BR DR Grayland Ormond  . LBBB (left bundle branch block)   . Osteoarthritis   . Osteoporosis   . Restless legs      ALLERGIES: Allergies  Allergen Reactions  . Aspirin     Upset stomach  . Diazepam Itching    sneezing  . Other     seasonal allergies  . Tetanus Toxoids     Localized superficial swelling of skin  . Morphine Itching and Rash      MEDICATIONS: I have reviewed the patient's current medications.     PAST SURGICAL HISTORY Past Surgical History:  Procedure Laterality Date  . CARPAL TUNNEL RELEASE    . ESOPHAGOGASTRODUODENOSCOPY (EGD) WITH PROPOFOL N/A 09/10/2015   Procedure: ESOPHAGOGASTRODUODENOSCOPY (EGD) WITH PROPOFOL;  Surgeon: Lollie Sails, MD;  Location: [facility] ENDOSCOPY;  Service: Endoscopy;  Laterality: N/A;  . HIP PINNING,CANNULATED Left 10/31/2020   Procedure: CANNULATED HIP PINNING;  Surgeon: Leim Fabry, MD;  Location: [facility] ORS;  Service: Orthopedics;  Laterality: Left;  . IRRIGATION AND DEBRIDEMENT HEMATOMA Right 07/13/2018   Procedure: IRRIGATION AND DEBRIDEMENT HEMATOMA-RIGHT SHIN;  Surgeon: Herbert Pun, MD;  Location: [facility] ORS;  Service: General;  Laterality: Right;  . KYPHOPLASTY N/A 05/02/2019   Procedure: L1 KYPHOPLASTY;  Surgeon: Hessie Knows, MD;  Location: [facility] ORS;  Service: Orthopedics;  Laterality: N/A;  . LUMBAR [place]    . REVERSE SHOULDER ARTHROPLASTY Left 06/25/2020   Procedure: REVERSE SHOULDER ARTHROPLASTY;  Surgeon: Corky Mull, MD;  Location: [facility] ORS;  Service: Orthopedics;  Laterality: Left;  . SPLENECTOMY, PARTIAL    . TOTAL ABDOMINAL HYSTERECTOMY      FAMILY HISTORY: Family History  Problem Relation Age of Onset  . Stroke Mother     SOCIAL HISTORY:  reports that she has never smoked. She has never used smokeless tobacco. She reports that she does not drink alcohol and does not use drugs.  PERFORMANCE STATUS: The patient's performance status is 3 - Symptomatic, >50% confined to bed  PHYSICAL EXAM: Most Recent Vital Signs: Blood pressure 118/88, pulse 70, temperature 98.7 F (37.1 C), temperature source Oral,  resp. rate 16, height 5\' 2"  (1.575 m), weight 121 lb 4.1 oz (55 kg), SpO2 97 %. BP 118/88 (BP Location: Left Arm)   Pulse 70   Temp 98.7 F (37.1 C) (Oral)   Resp 16   Ht 5\' 2"  (1.575 m)   Wt 121 lb 4.1 oz (55 kg)   SpO2 97%   BMI 22.18 kg/m  General appearance: alert, cooperative and appears stated age Lungs: Bilateral air entry. Heart: regular rate and rhythm Abdomen: soft, non-tender; bowel sounds normal; no masses,  no organomegaly Extremities: Ecchymosis on upper extremities.  No edema or cyanosis. Skin: Ecchymosis on the right hip region and upper extremities. Lymph nodes: Cervical, supraclavicular, and axillary nodes normal. Neurologic: Grossly normal  LABORATORY DATA:  Results for orders placed or performed during the hospital encounter of 02/03/21 (from the past 48 hour(s))  Basic metabolic panel     Status: None   Collection Time: 02/03/21  9:59 AM  Result Value Ref Range   Sodium 139 135 - 145 mmol/L   Potassium 3.8 3.5 - 5.1 mmol/L   Chloride 105 98 - 111 mmol/L   CO2 24 22 - 32 mmol/L   Glucose, Bld 91 70 - 99 mg/dL    Comment: Glucose reference range applies only to samples taken after fasting for at least 8 hours.   BUN 11 8 - 23 mg/dL   Creatinine, Ser 0.73 0.44 - 1.00 mg/dL   Calcium 9.4 8.9 - 10.3 mg/dL   GFR, Estimated >60 >60 mL/min    Comment: (NOTE) Calculated using the CKD-EPI Creatinine Equation (2021)    Anion gap 10 5 - 15    Comment: Performed at [facility], [address] [place], [place] [address]  CBC with Differential     Status: Abnormal   Collection Time: 02/03/21  9:59 AM  Result Value Ref Range   WBC 12.0 (H) 4.0 - 10.5 K/uL   RBC 4.24 3.87 - 5.11 MIL/uL   Hemoglobin 12.3 12.0 - 15.0 g/dL   HCT 39.6 36.0 - 46.0 %   MCV 93.4 80.0 - 100.0 fL   MCH 29.0 26.0 - 34.0 pg   MCHC 31.1 30.0 - 36.0 g/dL   RDW 18.6 (H) 11.5 - 15.5 %   Platelets 21 (LL) 150 - 400 K/uL    Comment: SPECIMEN CHECKED FOR CLOTS Immature Platelet Fraction may  be clinically indicated, consider ordering this additional test [id number] PLATELET COUNT CONFIRMED BY SMEAR THIS CRITICAL RESULT HAS VERIFIED AND BEEN CALLED TO [facility] BY BOBBIE MATTHEWS ON 04 25 2022 AT 1046, AND HAS BEEN READ BACK.     nRBC 0.0 0.0 - 0.2 %   Neutrophils Relative % 72 %   Neutro Abs 8.6 (H) 1.7 - 7.7 K/uL   Lymphocytes Relative 20 %   Lymphs Abs 2.4 0.7 - 4.0 K/uL   Monocytes Relative 6 %   Monocytes Absolute 0.7 0.1 -  1.0 K/uL   Eosinophils Relative 1 %   Eosinophils Absolute 0.2 0.0 - 0.5 K/uL   Basophils Relative 1 %   Basophils Absolute 0.1 0.0 - 0.1 K/uL   Smear Review GIANT PLTS PRESENT    Immature Granulocytes 0 %   Abs Immature Granulocytes 0.03 0.00 - 0.07 K/uL    Comment: Performed at [facility], [address] [place], [place] [address]  Resp Panel by RT-PCR (Flu A&B, Covid) Nasopharyngeal Swab     Status: None   Collection Time: 02/03/21  2:00 PM   Specimen: Nasopharyngeal Swab; Nasopharyngeal(NP) swabs in vial transport medium  Result Value Ref Range   SARS Coronavirus 2 by RT PCR NEGATIVE NEGATIVE    Comment: (NOTE) SARS-CoV-2 target nucleic acids are NOT DETECTED.  The SARS-CoV-2 RNA is generally detectable in upper respiratory specimens during the acute phase of infection. The lowest concentration of SARS-CoV-2 viral copies this assay can detect is 138 copies/mL. A negative result does not preclude SARS-Cov-2 infection and should not be used as the sole basis for treatment or other patient management decisions. A negative result may occur with  improper specimen collection/handling, submission of specimen other than nasopharyngeal swab, presence of viral mutation(s) within the areas targeted by this assay, and inadequate number of viral copies(<138 copies/mL). A negative result must be combined with clinical observations, patient history, and epidemiological information. The expected result is Negative.  Fact Sheet for Patients:   EntrepreneurPulse.com.au  Fact Sheet for Healthcare Providers:  IncredibleEmployment.be  This test is no t yet approved or cleared by the [place] FDA and  has been authorized for detection and/or diagnosis of SARS-CoV-2 by FDA under an Emergency Use Authorization (EUA). This EUA will remain  in effect (meaning this test can be used) for the duration of the COVID-19 declaration under Section 564(b)(1) of the Act, 21 U.S.C.section 360bbb-3(b)(1), unless the authorization is terminated  or revoked sooner.       Influenza A by PCR NEGATIVE NEGATIVE   Influenza B by PCR NEGATIVE NEGATIVE    Comment: (NOTE) The Xpert Xpress SARS-CoV-2/FLU/RSV plus assay is intended as an aid in the diagnosis of influenza from Nasopharyngeal swab specimens and should not be used as a sole basis for treatment. Nasal washings and aspirates are unacceptable for Xpert Xpress SARS-CoV-2/FLU/RSV testing.  Fact Sheet for Patients: EntrepreneurPulse.com.au  Fact Sheet for Healthcare Providers: IncredibleEmployment.be  This test is not yet approved or cleared by the [place] FDA and has been authorized for detection and/or diagnosis of SARS-CoV-2 by FDA under an Emergency Use Authorization (EUA). This EUA will remain in effect (meaning this test can be used) for the duration of the COVID-19 declaration under Section 564(b)(1) of the Act, 21 U.S.C. section 360bbb-3(b)(1), unless the authorization is terminated or revoked.  Performed at [facility], [address], [place], [place] [address]   Comprehensive metabolic panel     Status: None   Collection Time: 02/04/21  5:44 AM  Result Value Ref Range   Sodium 137 135 - 145 mmol/L   Potassium 4.0 3.5 - 5.1 mmol/L   Chloride 102 98 - 111 mmol/L   CO2 24 22 - 32 mmol/L   Glucose, Bld 73 70 - 99 mg/dL    Comment: Glucose reference range applies only to samples taken after  fasting for at least 8 hours.   BUN 17 8 - 23 mg/dL   Creatinine, Ser 0.81 0.44 - 1.00 mg/dL   Calcium 9.2 8.9 - 10.3 mg/dL  Total Protein 6.8 6.5 - 8.1 g/dL   Albumin 3.5 3.5 - 5.0 g/dL   AST 21 15 - 41 U/L   ALT 16 0 - 44 U/L   Alkaline Phosphatase 63 38 - 126 U/L   Total Bilirubin 0.6 0.3 - 1.2 mg/dL   GFR, Estimated >60 >60 mL/min    Comment: (NOTE) Calculated using the CKD-EPI Creatinine Equation (2021)    Anion gap 11 5 - 15    Comment: Performed at [facility], [address] [place], [place] [address]  CBC with Differential/Platelet     Status: Abnormal   Collection Time: 02/04/21  5:44 AM  Result Value Ref Range   WBC 8.5 4.0 - 10.5 K/uL   RBC 4.01 3.87 - 5.11 MIL/uL   Hemoglobin 11.6 (L) 12.0 - 15.0 g/dL   HCT 36.9 36.0 - 46.0 %   MCV 92.0 80.0 - 100.0 fL   MCH 28.9 26.0 - 34.0 pg   MCHC 31.4 30.0 - 36.0 g/dL   RDW 18.6 (H) 11.5 - 15.5 %   Platelets 19 (LL) 150 - 400 K/uL    Comment: CRITICAL VALUE NOTED.  VALUE IS CONSISTENT WITH PREVIOUSLY REPORTED AND CALLED VALUE. THIS CRITICAL RESULT HAS VERIFIED AND BEEN CALLED TO [facility] RN BY Charlie Pitter ON 04 26 2022 AT 41, AND HAS BEEN READ BACK.     nRBC 0.0 0.0 - 0.2 %   Neutrophils Relative % 62 %   Neutro Abs 5.3 1.7 - 7.7 K/uL   Lymphocytes Relative 26 %   Lymphs Abs 2.2 0.7 - 4.0 K/uL   Monocytes Relative 9 %   Monocytes Absolute 0.7 0.1 - 1.0 K/uL   Eosinophils Relative 2 %   Eosinophils Absolute 0.1 0.0 - 0.5 K/uL   Basophils Relative 1 %   Basophils Absolute 0.1 0.0 - 0.1 K/uL   Immature Granulocytes 0 %   Abs Immature Granulocytes 0.03 0.00 - 0.07 K/uL    Comment: Performed at [facility], [address] [place], [place] [address]      RADIOGRAPHY: DG Elbow Complete Right  Result Date: 02/03/2021 CLINICAL DATA:  Fall from bed this morning with right elbow pain EXAM: RIGHT ELBOW - COMPLETE 3+ VIEW COMPARISON:  None. FINDINGS: No fracture or dislocation. Suboptimal lateral positioning limits  evaluation for a joint effusion, with no gross evidence of joint effusion. No focal osseous lesions. No significant arthropathy. No radiopaque foreign bodies. IMPRESSION: No right elbow fracture or dislocation. Suboptimal lateral positioning limits evaluation for a joint effusion, with no gross evidence of joint effusion. Electronically Signed   By: Ilona Sorrel M.D.   On: 02/03/2021 09:22   CT Head Wo Contrast  Result Date: 02/03/2021 CLINICAL DATA:  87 year old female status post fall out of bed. Pain. EXAM: CT HEAD WITHOUT CONTRAST TECHNIQUE: Contiguous axial images were obtained from the base of the skull through the vertex without intravenous contrast. COMPARISON:  Brain MRI 05/16/2014.  Head CT 01/05/2021. FINDINGS: Brain: Stable cerebral volume. No midline shift, ventriculomegaly, mass effect, evidence of mass lesion, intracranial hemorrhage or evidence of cortically based acute infarction. Widespread Patchy and confluent white matter hypodensity appears stable since March along with heterogeneity in the deep gray nuclei. Stable gray-white matter differentiation throughout the brain. Vascular: Calcified atherosclerosis at the skull base. No suspicious intracranial vascular hyperdensity. Skull: Stable and intact. Sinuses/Orbits: Visualized paranasal sinuses and mastoids are clear. Other: Mostly resolved posterior scalp hematoma demonstrated in March. No new scalp or orbits soft tissue injury  identified. IMPRESSION: No acute intracranial abnormality or acute traumatic injury. Chronic small vessel disease. Electronically Signed   By: Genevie Ann M.D.   On: 02/03/2021 09:34   CT Cervical Spine Wo Contrast  Result Date: 02/03/2021 CLINICAL DATA:  87 year old female status post fall out of bed. Pain. EXAM: CT CERVICAL SPINE WITHOUT CONTRAST TECHNIQUE: Multidetector CT imaging of the cervical spine was performed without intravenous contrast. Multiplanar CT image reconstructions were also generated. COMPARISON:   Head CT today.  Cervical spine CT 01/05/2021. FINDINGS: Alignment: Stable since March. Straightening of cervical lordosis with mild chronic anterolisthesis of C2 on C3 and C7 on T1. Bilateral posterior element alignment is within normal limits. Skull base and vertebrae: Visualized skull base is intact. No atlanto-occipital dissociation. C1 and C2 appear stable and intact. No acute osseous abnormality identified. Soft tissues and spinal canal: No prevertebral fluid or swelling. No visible canal hematoma. Calcified carotid atherosclerosis. Disc levels: Widespread severe cervical spine degeneration, including at the anterior C1-C2 articulation, appears stable. Upper chest: Visible upper thoracic levels appear stable allowing for mild motion today. Negative lung apices. Other: None. IMPRESSION: 1. No acute traumatic injury identified in the cervical spine. 2. Stable advanced cervical spine degeneration. Electronically Signed   By: Genevie Ann M.D.   On: 02/03/2021 09:40   CT Lumbar Spine Wo Contrast  Result Date: 02/03/2021 CLINICAL DATA:  87 year old female status post fall out of bed. Pain. EXAM: CT LUMBAR SPINE WITHOUT CONTRAST TECHNIQUE: Multidetector CT imaging of the lumbar spine was performed without intravenous contrast administration. Multiplanar CT image reconstructions were also generated. COMPARISON:  Lumbar spine CT 04/27/2019. CTA abdomen and pelvis 08/18/2020. FINDINGS: Segmentation: Transitional anatomy, partially sacralized L5 which is the same numbering system used in 2020. Alignment: Lumbar lordosis not significantly changed since 2020 and stable since November. Vertebrae: Previously augmented L1 compression fracture appears stable since November. Postoperative details at L4-L5 are below. No superimposed acute osseous abnormality identified. Stable bone mineralization. Visible sacrum and SI joints appear intact. Paraspinal and other soft tissues: Stable, including surgical clips in the left upper  quadrant and diverticulosis of the large bowel. Disc levels: Chronic severe L2-L3 disc and endplate degeneration appears stable since 2020. Chronic L3-L4 posterior decompression appear stable since 2020. Prior decompression and fusion at L4-L5 with stable bilateral pedicle and interbody hardware. Solid-appearing posterior element arthrodesis. IMPRESSION: Stable CT appearance of the lumbar spine since last year. No acute traumatic injury identified. Electronically Signed   By: Genevie Ann M.D.   On: 02/03/2021 09:47   DG Hand Complete Right  Result Date: 02/03/2021 CLINICAL DATA:  Fall from bed this morning with right hip pain EXAM: RIGHT HAND - COMPLETE 3+ VIEW COMPARISON:  None. FINDINGS: No fracture or dislocation. No suspicious focal osseous lesions. Moderate first carpometacarpal joint osteoarthritis. Mild interphalangeal joint right thumb osteoarthritis. Moderate osteoarthritis throughout the interphalangeal joints of the second through fifth fingers, most prominent in the second DIP joint with associated central erosive component. No radiopaque foreign bodies. IMPRESSION: 1. No fracture or dislocation in the right hand. 2. Moderate polyarticular erosive osteoarthritis. Electronically Signed   By: Ilona Sorrel M.D.   On: 02/03/2021 09:24   DG Hip Unilat W or Wo Pelvis 2-3 Views Left  Result Date: 02/03/2021 CLINICAL DATA:  Fall from bed this morning with pain. Fixation of left proximal femur fracture in January. EXAM: DG HIP (WITH OR WITHOUT PELVIS) 2-3V LEFT COMPARISON:  10/30/2020 left hip radiographs FINDINGS: Intact pins transfix healed deformity in the subcapital  left femoral neck. No acute fracture. No left hip dislocation. No pelvic diastasis. Bilateral posterior spinal fusion hardware in the lower lumbar spine. No suspicious focal osseous lesions. Mild bilateral hip osteoarthritis. IMPRESSION: No acute osseous abnormality. ORIF healed subcapital left femoral neck fracture with no hardware  complication. Mild bilateral hip osteoarthritis. Electronically Signed   By: Ilona Sorrel M.D.   On: 02/03/2021 09:27         ASSESSMENT and PLAN:  1.  Chronic refractory ITP: - Prior poor response to prednisone, WinRho, and rituximab.  Status post splenectomy in 2007. - Presented to the ER after fall with platelet count of 21 on 02/03/2021.  Prior platelet count on 01/17/2021 was 675.  She reportedly had some bleeding in the right hand and right elbow. - Received 1 dose of Nplate 550 mcg on [id number] evening in the ER. - Platelet count today is down to 19,000.  No active bleed at this time. - Previously she required combination of Nplate and IVIG. - Would recommend IVIG 1 g/kg x 2 days. - We will check CBC tomorrow.  If platelet count more than 50 K, second dose may be omitted. - In the future, will consider Promacta or Tavalisse.  2.  Alzheimer's dementia: - She is not oriented to place or person. - Continue her dementia medications.  3.  Normocytic anemia: - Her hemoglobin today is 11.6 with MCV 92.  We will closely monitor. -Hemoglobin can drop by 1 g from hemolysis from IVIG.  All questions were answered. The patient knows to call the clinic with any problems, questions or concerns. We can certainly see the patient much sooner if necessary.    Derek Jack

## 2021-02-05 DIAGNOSIS — E785 Hyperlipidemia, unspecified: Secondary | ICD-10-CM

## 2021-02-05 DIAGNOSIS — D649 Anemia, unspecified: Secondary | ICD-10-CM

## 2021-02-05 LAB — CBC WITH DIFFERENTIAL/PLATELET
Abs Immature Granulocytes: 0.03 10*3/uL (ref 0.00–0.07)
Basophils Absolute: 0 10*3/uL (ref 0.0–0.1)
Basophils Relative: 1 %
Eosinophils Absolute: 0.3 10*3/uL (ref 0.0–0.5)
Eosinophils Relative: 4 %
HCT: 35.2 % — ABNORMAL LOW (ref 36.0–46.0)
Hemoglobin: 11.1 g/dL — ABNORMAL LOW (ref 12.0–15.0)
Immature Granulocytes: 0 %
Lymphocytes Relative: 24 %
Lymphs Abs: 1.7 10*3/uL (ref 0.7–4.0)
MCH: 29 pg (ref 26.0–34.0)
MCHC: 31.5 g/dL (ref 30.0–36.0)
MCV: 91.9 fL (ref 80.0–100.0)
Monocytes Absolute: 0.7 10*3/uL (ref 0.1–1.0)
Monocytes Relative: 10 %
Neutro Abs: 4.4 10*3/uL (ref 1.7–7.7)
Neutrophils Relative %: 61 %
Platelets: 40 10*3/uL — ABNORMAL LOW (ref 150–400)
RBC: 3.83 MIL/uL — ABNORMAL LOW (ref 3.87–5.11)
RDW: 18.2 % — ABNORMAL HIGH (ref 11.5–15.5)
WBC: 7.1 10*3/uL (ref 4.0–10.5)
nRBC: 0 % (ref 0.0–0.2)

## 2021-02-05 LAB — LACTATE DEHYDROGENASE: LDH: 146 U/L (ref 98–192)

## 2021-02-05 MED ORDER — HALOPERIDOL LACTATE 5 MG/ML IJ SOLN: 2.0000 mg | Freq: Four times a day (QID) | INTRAMUSCULAR | Status: DC | PRN

## 2021-02-05 NOTE — Consult Note (Signed)
[facility] Oncology Progress Note  Name: Ariana [name]      MRN: [id number]    Location: E315/Q008-67  Date: 02/05/2021 Time:1:02 PM   Subjective: Interval History:Ariana [name] is seen today for follow-up of severe thrombocytopenia.  She is resting comfortably in the bed.  No active bleeding reported.  She is able to walk to the bathroom.  She had agitation last night and is placed on observation one-on-one.  Objective: Vital signs in last 24 hours: Temp:  [96.4 F (35.8 C)-98.7 F (37.1 C)] 97.8 F (36.6 C) (04/27 0554) Pulse Rate:  [60-70] 67 (04/27 0554) Resp:  [16-20] 20 (04/27 0554) BP: (118-145)/(59-88) 139/63 (04/27 0554) SpO2:  [65 %-100 %] 95 % (04/27 0726)    Intake/Output from previous day: 04/26 0800 - 04/27 0759 In: 960 [P.O.:960] Out: -     Intake/Output this shift: Total I/O In: 480 [P.O.:480] Out: 300 [Urine:300]   PHYSICAL EXAM: BP 139/63 (BP Location: Left Arm)   Pulse 67   Temp 97.8 F (36.6 C)   Resp 20   Ht 5\' 2"  (1.575 m)   Wt 121 lb 4.1 oz (55 kg)   SpO2 95%   BMI 22.18 kg/m  Lungs: Bilateral air entry. Heart: regular rate and rhythm Abdomen: soft, non-tender; bowel sounds normal; no masses,  no organomegaly Extremities: No edema or cyanosis Skin: Ecchymosis on upper and lower extremities have been stable. Neurologic: Grossly normal She is not oriented to the place or time.  Studies/Results: Results for orders placed or performed during the hospital encounter of 02/03/21 (from the past 48 hour(s))  Resp Panel by RT-PCR (Flu A&B, Covid) Nasopharyngeal Swab     Status: None   Collection Time: 02/03/21  2:00 PM   Specimen: Nasopharyngeal Swab; Nasopharyngeal(NP) swabs in vial transport medium  Result Value Ref Range   SARS Coronavirus 2 by RT PCR NEGATIVE NEGATIVE    Comment: (NOTE) SARS-CoV-2 target nucleic acids are NOT DETECTED.  The SARS-CoV-2 RNA is generally detectable in upper respiratory specimens during the acute  phase of infection. The lowest concentration of SARS-CoV-2 viral copies this assay can detect is 138 copies/mL. A negative result does not preclude SARS-Cov-2 infection and should not be used as the sole basis for treatment or other patient management decisions. A negative result may occur with  improper specimen collection/handling, submission of specimen other than nasopharyngeal swab, presence of viral mutation(s) within the areas targeted by this assay, and inadequate number of viral copies(<138 copies/mL). A negative result must be combined with clinical observations, patient history, and epidemiological information. The expected result is Negative.  Fact Sheet for Patients:  EntrepreneurPulse.com.au  Fact Sheet for Healthcare Providers:  IncredibleEmployment.be  This test is no t yet approved or cleared by the [place] FDA and  has been authorized for detection and/or diagnosis of SARS-CoV-2 by FDA under an Emergency Use Authorization (EUA). This EUA will remain  in effect (meaning this test can be used) for the duration of the COVID-19 declaration under Section 564(b)(1) of the Act, 21 U.S.C.section 360bbb-3(b)(1), unless the authorization is terminated  or revoked sooner.       Influenza A by PCR NEGATIVE NEGATIVE   Influenza B by PCR NEGATIVE NEGATIVE    Comment: (NOTE) The Xpert Xpress SARS-CoV-2/FLU/RSV plus assay is intended as an aid in the diagnosis of influenza from Nasopharyngeal swab specimens and should not be used as a sole basis for treatment. Nasal washings and aspirates are unacceptable for Xpert Xpress SARS-CoV-2/FLU/RSV testing.  Fact Sheet for Patients: EntrepreneurPulse.com.au  Fact Sheet for Healthcare Providers: IncredibleEmployment.be  This test is not yet approved or cleared by the [place] FDA and has been authorized for detection and/or diagnosis of SARS-CoV-2  by FDA under an Emergency Use Authorization (EUA). This EUA will remain in effect (meaning this test can be used) for the duration of the COVID-19 declaration under Section 564(b)(1) of the Act, 21 U.S.C. section 360bbb-3(b)(1), unless the authorization is terminated or revoked.  Performed at [facility], [address], [place], [place] [address]   Comprehensive metabolic panel     Status: None   Collection Time: 02/04/21  5:44 AM  Result Value Ref Range   Sodium 137 135 - 145 mmol/L   Potassium 4.0 3.5 - 5.1 mmol/L   Chloride 102 98 - 111 mmol/L   CO2 24 22 - 32 mmol/L   Glucose, Bld 73 70 - 99 mg/dL    Comment: Glucose reference range applies only to samples taken after fasting for at least 8 hours.   BUN 17 8 - 23 mg/dL   Creatinine, Ser 0.81 0.44 - 1.00 mg/dL   Calcium 9.2 8.9 - 10.3 mg/dL   Total Protein 6.8 6.5 - 8.1 g/dL   Albumin 3.5 3.5 - 5.0 g/dL   AST 21 15 - 41 U/L   ALT 16 0 - 44 U/L   Alkaline Phosphatase 63 38 - 126 U/L   Total Bilirubin 0.6 0.3 - 1.2 mg/dL   GFR, Estimated >60 >60 mL/min    Comment: (NOTE) Calculated using the CKD-EPI Creatinine Equation (2021)    Anion gap 11 5 - 15    Comment: Performed at [facility], [address] [place], [place] [address]  CBC with Differential/Platelet     Status: Abnormal   Collection Time: 02/04/21  5:44 AM  Result Value Ref Range   WBC 8.5 4.0 - 10.5 K/uL   RBC 4.01 3.87 - 5.11 MIL/uL   Hemoglobin 11.6 (L) 12.0 - 15.0 g/dL   HCT 36.9 36.0 - 46.0 %   MCV 92.0 80.0 - 100.0 fL   MCH 28.9 26.0 - 34.0 pg   MCHC 31.4 30.0 - 36.0 g/dL   RDW 18.6 (H) 11.5 - 15.5 %   Platelets 19 (LL) 150 - 400 K/uL    Comment: CRITICAL VALUE NOTED.  VALUE IS CONSISTENT WITH PREVIOUSLY REPORTED AND CALLED VALUE. THIS CRITICAL RESULT HAS VERIFIED AND BEEN CALLED TO MORGAN RN BY Charlie Pitter ON 04 26 2022 AT 39, AND HAS BEEN READ BACK.     nRBC 0.0 0.0 - 0.2 %   Neutrophils Relative % 62 %   Neutro Abs 5.3 1.7 - 7.7 K/uL    Lymphocytes Relative 26 %   Lymphs Abs 2.2 0.7 - 4.0 K/uL   Monocytes Relative 9 %   Monocytes Absolute 0.7 0.1 - 1.0 K/uL   Eosinophils Relative 2 %   Eosinophils Absolute 0.1 0.0 - 0.5 K/uL   Basophils Relative 1 %   Basophils Absolute 0.1 0.0 - 0.1 K/uL   Immature Granulocytes 0 %   Abs Immature Granulocytes 0.03 0.00 - 0.07 K/uL    Comment: Performed at [facility], [address], [place],  [address]  CBC with Differential/Platelet     Status: Abnormal   Collection Time: 02/05/21  6:46 AM  Result Value Ref Range   WBC 7.1 4.0 - 10.5 K/uL    Comment: WHITE COUNT CONFIRMED ON SMEAR   RBC 3.83 (L) 3.87 -  5.11 MIL/uL   Hemoglobin 11.1 (L) 12.0 - 15.0 g/dL   HCT 35.2 (L) 36.0 - 46.0 %   MCV 91.9 80.0 - 100.0 fL   MCH 29.0 26.0 - 34.0 pg   MCHC 31.5 30.0 - 36.0 g/dL   RDW 18.2 (H) 11.5 - 15.5 %   Platelets 40 (L) 150 - 400 K/uL    Comment: SPECIMEN CHECKED FOR CLOTS Immature Platelet Fraction may be clinically indicated, consider ordering this additional test [id number] PLATELET COUNT CONFIRMED BY SMEAR    nRBC 0.0 0.0 - 0.2 %   Neutrophils Relative % 61 %   Neutro Abs 4.4 1.7 - 7.7 K/uL   Lymphocytes Relative 24 %   Lymphs Abs 1.7 0.7 - 4.0 K/uL   Monocytes Relative 10 %   Monocytes Absolute 0.7 0.1 - 1.0 K/uL   Eosinophils Relative 4 %   Eosinophils Absolute 0.3 0.0 - 0.5 K/uL   Basophils Relative 1 %   Basophils Absolute 0.0 0.0 - 0.1 K/uL   Immature Granulocytes 0 %   Abs Immature Granulocytes 0.03 0.00 - 0.07 K/uL    Comment: Performed at [facility], [address], [place], [place] [address]  Lactate dehydrogenase     Status: None   Collection Time: 02/05/21  6:46 AM  Result Value Ref Range   LDH 146 98 - 192 U/L    Comment: Performed at [facility], [address] [place], [place] [address]   No results found.   MEDICATIONS: I have reviewed the patient's current medications.     Assessment/Plan:  1.  Chronic refractory ITP: - Prior poor  response to prednisone, winRho, rituximab. - Status post splenectomy in 2007. - Presented to the ER after a fall with platelet count of 21 on 02/03/2021.  Prior platelet count on 01/17/2021 was 675.  She had some bleeding in the right hand and right elbow at that time. - She received 1 dose of Nplate 50 mcg on 9/44/9675 evening in the ER. - Yesterday platelet count was 19.  She received her first dose of IVIG 1 g/kg. - Today platelet count improved to 40.  No active bleeding.  LDH was normal. - She will receive another dose of IVIG 1 g/kg today. - She may be transferred back to her facility tomorrow if no active bleeding. - She will follow-up with Dr. Grayland Ormond in 2 weeks.  He is considering her to be started on Promacta or Tavalisse.  2.  Normocytic anemia: - Hemoglobin today is 11.1 with MCV 91.9. - Slight hemolysis with drop in hemoglobin as expected after IVIG.  All questions were answered. The patient knows to call the clinic with any problems, questions or concerns. We can certainly see the patient much sooner if necessary.    Derek Jack

## 2021-02-05 NOTE — Progress Notes (Signed)
Patient refused her hourly vital sign check per privgen order set. Will continue to monitor

## 2021-02-05 NOTE — Progress Notes (Addendum)
Notified MD that patient was very agitated, and that patient was trying to get out of bed even after several attempts to redirect. Due to this nursing staff has had to be at bedside with patient at all times to keep patient in bed. MD ordered for patient to receive her night time dose of gabapentin and a 1 to 1 sitter order was placed.

## 2021-02-05 NOTE — Progress Notes (Signed)
PROGRESS NOTE   Ariana [name]  TGG:[id number] DOB: 1933-09-13 DOA: 02/03/2021 PCP: Caprice Renshaw, MD   Chief Complaint  Patient presents with  . Fall   Level of care: Med-Surg  Brief Admission History:  87 y.o.femalewith medical history ofITP, Alzheimer's dementia, depression, hyperlipidemia,GERD,hypertension, hypothyroidism, asthma/COPDand recurrent falls presenting from her SNF after a mechanical fall when she rolled out of bed.Hematology, Dr. Juleen China consulted by EDP and recommended giving Nplate x 1and admission was requested.  Assessment & Plan:   Active Problems:   Acute ITP (HCC)   Acquired hypothyroidism   Benign essential hypertension   HLD (hyperlipidemia)   Idiopathic thrombocythemia (HCC)   Thrombocytopenia (HCC)   Skin tear of right hand without complication   Normocytic anemia   ITP with severe thrombocytopenia - Pt slowly responding to treatments, platelet count now up to 40 and no bleeding events seen.  I spoke with Dr. Delton Coombes, plan is to repea IVIG infusion today.  Pt could discharge tomorrow if no active bleeding.    COPD - stable, remains on room air.  Follow.   Dementia of Alzheimer's type - resumed all  Home meds.  She has been sundowning and haldol has been ordered.    Hypothyroidism - stable on home meds.   IDA - stable, continue MVI supplement.   Essential hypertension -BP controlled on home amlodipine.    GERD - protonix ordered for GI  Protection.   DVT prophylaxis: Home  Code Status: DNR  Family Communication:  Disposition: anticipating return SNF  Status is: Inpatient  Remains inpatient appropriate because:IV treatments appropriate due to intensity of illness or inability to take PO and Inpatient level of care appropriate due to severity of illness   Dispo: The patient is from: SNF              Anticipated d/c is to: SNF              Patient currently is not medically stable to d/c.   Difficult to place patient No    Consultants:   hematology  Procedures:   n/a  Antimicrobials:  n/a  Subjective: Pt without specific complaints except some bruising seen on right forearm.    Objective: Vitals:   02/05/21 0115 02/05/21 0205 02/05/21 0554 02/05/21 0726  BP: 132/62 137/69 139/63   Pulse: 60 64 67   Resp: 18  20   Temp: 98.1 F (36.7 C) 97.8 F (36.6 C) 97.8 F (36.6 C)   TempSrc: Oral Oral    SpO2: 99% 100% 95% 95%  Weight:      Height:        Intake/Output Summary (Last 24 hours) at 02/05/2021 1335 Last data filed at 02/05/2021 1100 Gross per 24 hour  Intake 960 ml  Output 300 ml  Net 660 ml   Filed Weights   02/03/21 1305  Weight: 55 kg    Examination:  General exam: frail, elderly female with dementia, Appears calm and comfortable  Respiratory system: Clear to auscultation. Respiratory effort normal. Cardiovascular system: normal S1 & S2 heard. No JVD, murmurs, rubs, gallops or clicks. No pedal edema. Gastrointestinal system: Abdomen is nondistended, soft and nontender. No organomegaly or masses felt. Normal bowel sounds heard. Central nervous system: Alert and oriented. No focal neurological deficits. Extremities: Symmetric 5 x 5 power. Skin: trace petechiae seen right forearm with bruising.  No rashes, lesions or ulcers Psychiatry: Judgement and insight appear poor. Mood & affect appropriate.   Data Reviewed: I have personally reviewed following  labs and imaging studies  CBC: Recent Labs  Lab 02/03/21 0959 02/04/21 0544 02/05/21 0646  WBC 12.0* 8.5 7.1  NEUTROABS 8.6* 5.3 4.4  HGB 12.3 11.6* 11.1*  HCT 39.6 36.9 35.2*  MCV 93.4 92.0 91.9  PLT 21* 19* 40*    Basic Metabolic Panel: Recent Labs  Lab 02/03/21 0959 02/04/21 0544  NA 139 137  K 3.8 4.0  CL 105 102  CO2 24 24  GLUCOSE 91 73  BUN 11 17  CREATININE 0.73 0.81  CALCIUM 9.4 9.2    GFR: Estimated Creatinine Clearance: 40.2 mL/min (by C-G formula based on SCr of 0.81 mg/dL).  Liver Function  Tests: Recent Labs  Lab 02/04/21 0544  AST 21  ALT 16  ALKPHOS 63  BILITOT 0.6  PROT 6.8  ALBUMIN 3.5    CBG: No results for input(s): GLUCAP in the last 168 hours.  Recent Results (from the past 240 hour(s))  Resp Panel by RT-PCR (Flu A&B, Covid) Nasopharyngeal Swab     Status: None   Collection Time: 02/03/21  2:00 PM   Specimen: Nasopharyngeal Swab; Nasopharyngeal(NP) swabs in vial transport medium  Result Value Ref Range Status   SARS Coronavirus 2 by RT PCR NEGATIVE NEGATIVE Final    Comment: (NOTE) SARS-CoV-2 target nucleic acids are NOT DETECTED.  The SARS-CoV-2 RNA is generally detectable in upper respiratory specimens during the acute phase of infection. The lowest concentration of SARS-CoV-2 viral copies this assay can detect is 138 copies/mL. A negative result does not preclude SARS-Cov-2 infection and should not be used as the sole basis for treatment or other patient management decisions. A negative result may occur with  improper specimen collection/handling, submission of specimen other than nasopharyngeal swab, presence of viral mutation(s) within the areas targeted by this assay, and inadequate number of viral copies(<138 copies/mL). A negative result must be combined with clinical observations, patient history, and epidemiological information. The expected result is Negative.  Fact Sheet for Patients:  EntrepreneurPulse.com.au  Fact Sheet for Healthcare Providers:  IncredibleEmployment.be  This test is no t yet approved or cleared by the [place] FDA and  has been authorized for detection and/or diagnosis of SARS-CoV-2 by FDA under an Emergency Use Authorization (EUA). This EUA will remain  in effect (meaning this test can be used) for the duration of the COVID-19 declaration under Section 564(b)(1) of the Act, 21 U.S.C.section 360bbb-3(b)(1), unless the authorization is terminated  or revoked sooner.        Influenza A by PCR NEGATIVE NEGATIVE Final   Influenza B by PCR NEGATIVE NEGATIVE Final    Comment: (NOTE) The Xpert Xpress SARS-CoV-2/FLU/RSV plus assay is intended as an aid in the diagnosis of influenza from Nasopharyngeal swab specimens and should not be used as a sole basis for treatment. Nasal washings and aspirates are unacceptable for Xpert Xpress SARS-CoV-2/FLU/RSV testing.  Fact Sheet for Patients: EntrepreneurPulse.com.au  Fact Sheet for Healthcare Providers: IncredibleEmployment.be  This test is not yet approved or cleared by the [place] FDA and has been authorized for detection and/or diagnosis of SARS-CoV-2 by FDA under an Emergency Use Authorization (EUA). This EUA will remain in effect (meaning this test can be used) for the duration of the COVID-19 declaration under Section 564(b)(1) of the Act, 21 U.S.C. section 360bbb-3(b)(1), unless the authorization is terminated or revoked.  Performed at [facility], [address] [place], [place] [address]      Radiology Studies: No results found.  Scheduled Meds: . amLODipine  10 mg Oral Daily  . busPIRone  7.5 mg Oral BID  . cholecalciferol  2,000 Units Oral Daily  . citalopram  20 mg Oral Daily  . fluticasone furoate-vilanterol  1 puff Inhalation Daily  . gabapentin  200 mg Oral QID  . iron polysaccharides  150 mg Oral Daily  . lamoTRIgine  50 mg Oral QHS  . levothyroxine  50 mcg Oral QAC breakfast  . loratadine  10 mg Oral Daily  . memantine  10 mg Oral BID  . montelukast  10 mg Oral QHS  . multivitamin with minerals  1 tablet Oral Daily  . oxybutynin  5 mg Oral TID  . pantoprazole  40 mg Oral Daily  . rosuvastatin  5 mg Oral Daily  . vitamin B-12  1,000 mcg Oral Daily  . vitamin E  400 Units Oral Daily   Continuous Infusions: . IMMUNE GLOBULIN 10% (HUMAN) IV - For Fluid Restriction Only       LOS: 1 day   Time spent: 36 mins   Maloni Musleh Wynetta Emery, MD How to  contact the [facility] Attending or Consulting provider [place] or covering provider during after hours [place], for this patient?  1. Check the care team in [facility] and look for a) attending/consulting TRH provider listed and b) the [facility] team listed 2. Log into [email or web address] and use [place]'s universal password to access. If you do not have the password, please contact the hospital operator. 3. Locate the [facility] provider you are looking for under [facility] and page to a number that you can be directly reached. 4. If you still have difficulty reaching the provider, please page the [facility] (Director on Call) for the Hospitalists listed on amion for assistance.  02/05/2021, 1:35 PM

## 2021-02-06 ENCOUNTER — Inpatient Hospital Stay: Payer: Medicare HMO | Admitting: Oncology

## 2021-02-06 ENCOUNTER — Inpatient Hospital Stay: Payer: Medicare HMO

## 2021-02-06 DIAGNOSIS — J441 Chronic obstructive pulmonary disease with (acute) exacerbation: Secondary | ICD-10-CM | POA: Diagnosis not present

## 2021-02-06 DIAGNOSIS — R6889 Other general symptoms and signs: Secondary | ICD-10-CM | POA: Diagnosis not present

## 2021-02-06 DIAGNOSIS — R41841 Cognitive communication deficit: Secondary | ICD-10-CM | POA: Diagnosis not present

## 2021-02-06 DIAGNOSIS — D693 Immune thrombocytopenic purpura: Secondary | ICD-10-CM

## 2021-02-06 DIAGNOSIS — R52 Pain, unspecified: Secondary | ICD-10-CM | POA: Diagnosis not present

## 2021-02-06 DIAGNOSIS — R5381 Other malaise: Secondary | ICD-10-CM | POA: Diagnosis not present

## 2021-02-06 DIAGNOSIS — E039 Hypothyroidism, unspecified: Secondary | ICD-10-CM | POA: Diagnosis not present

## 2021-02-06 DIAGNOSIS — R278 Other lack of coordination: Secondary | ICD-10-CM | POA: Diagnosis not present

## 2021-02-06 DIAGNOSIS — G629 Polyneuropathy, unspecified: Secondary | ICD-10-CM | POA: Diagnosis not present

## 2021-02-06 DIAGNOSIS — D649 Anemia, unspecified: Secondary | ICD-10-CM | POA: Diagnosis not present

## 2021-02-06 DIAGNOSIS — D473 Essential (hemorrhagic) thrombocythemia: Secondary | ICD-10-CM | POA: Diagnosis not present

## 2021-02-06 DIAGNOSIS — K219 Gastro-esophageal reflux disease without esophagitis: Secondary | ICD-10-CM | POA: Diagnosis not present

## 2021-02-06 DIAGNOSIS — E440 Moderate protein-calorie malnutrition: Secondary | ICD-10-CM | POA: Diagnosis not present

## 2021-02-06 DIAGNOSIS — M6281 Muscle weakness (generalized): Secondary | ICD-10-CM | POA: Diagnosis not present

## 2021-02-06 DIAGNOSIS — E785 Hyperlipidemia, unspecified: Secondary | ICD-10-CM | POA: Diagnosis not present

## 2021-02-06 DIAGNOSIS — Z23 Encounter for immunization: Secondary | ICD-10-CM | POA: Diagnosis not present

## 2021-02-06 DIAGNOSIS — Z7401 Bed confinement status: Secondary | ICD-10-CM | POA: Diagnosis not present

## 2021-02-06 DIAGNOSIS — I10 Essential (primary) hypertension: Secondary | ICD-10-CM | POA: Diagnosis not present

## 2021-02-06 DIAGNOSIS — D696 Thrombocytopenia, unspecified: Secondary | ICD-10-CM | POA: Diagnosis not present

## 2021-02-06 DIAGNOSIS — Z79899 Other long term (current) drug therapy: Secondary | ICD-10-CM | POA: Diagnosis not present

## 2021-02-06 LAB — CBC
HCT: 35.5 % — ABNORMAL LOW (ref 36.0–46.0)
Hemoglobin: 11 g/dL — ABNORMAL LOW (ref 12.0–15.0)
MCH: 28.7 pg (ref 26.0–34.0)
MCHC: 31 g/dL (ref 30.0–36.0)
MCV: 92.7 fL (ref 80.0–100.0)
Platelets: 86 10*3/uL — ABNORMAL LOW (ref 150–400)
RBC: 3.83 MIL/uL — ABNORMAL LOW (ref 3.87–5.11)
RDW: 18.6 % — ABNORMAL HIGH (ref 11.5–15.5)
WBC: 10.1 10*3/uL (ref 4.0–10.5)
nRBC: 0 % (ref 0.0–0.2)

## 2021-02-06 MED ORDER — GABAPENTIN 100 MG PO CAPS: 200.0000 mg | ORAL_CAPSULE | Freq: Four times a day (QID) | ORAL | Status: DC

## 2021-02-06 MED ORDER — CITALOPRAM HYDROBROMIDE 20 MG PO TABS: 20.0000 mg | ORAL_TABLET | ORAL | Status: AC

## 2021-02-06 MED ORDER — ALPRAZOLAM 0.5 MG PO TABS: 0.5000 mg | ORAL_TABLET | Freq: Three times a day (TID) | ORAL | 0 refills | Status: DC | PRN

## 2021-02-06 NOTE — Plan of Care (Signed)
  Problem: Acute Rehab PT Goals(only PT should resolve) Goal: Pt Will Go Supine/Side To Sit Outcome: Progressing Flowsheets (Taken 02/06/2021 1402) Pt will go Supine/Side to Sit: with supervision Goal: Pt Will Go Sit To Supine/Side Outcome: Progressing Flowsheets (Taken 02/06/2021 1402) Pt will go Sit to Supine/Side: with supervision Goal: Patient Will Transfer Sit To/From Stand Outcome: Progressing Flowsheets (Taken 02/06/2021 1402) Patient will transfer sit to/from stand: with supervision Goal: Pt Will Transfer Bed To Chair/Chair To Bed Outcome: Progressing Flowsheets (Taken 02/06/2021 1402) Pt will Transfer Bed to Chair/Chair to Bed: with supervision Goal: Pt Will Ambulate Outcome: Progressing Flowsheets (Taken 02/06/2021 1402) Pt will Ambulate:  > 125 feet  with supervision  with rolling walker Goal: Pt/caregiver will Perform Home Exercise Program Outcome: Progressing Flowsheets (Taken 02/06/2021 1402) Pt/caregiver will Perform Home Exercise Program:  For increased strengthening  For improved balance  With Supervision, verbal cues required/provided   Floria Raveling. Hartnett-Rands, MS, PT Per [place] [phone] 02/06/2021

## 2021-02-06 NOTE — TOC Transition Note (Signed)
Transition of Care [facility]) - CM/SW Discharge Note   Patient Details  Name: Ariana [name] MRN: [id number] Date of Birth: 1932/10/14  [facility] CM/SW Contact:  Ihor Gully, LCSW Phone Number: 02/06/2021, 3:23 PM   Clinical Narrative:    Facility received authorization. Discharge clinicals sent to facility. Faison,delexstie, legal guardian, advised of discharge and discharge summary securely emailed.    Final next level of care: Skilled Nursing Facility Barriers to Discharge: No Barriers Identified   Patient Goals and CMS Choice Patient states their goals for this hospitalization and ongoing recovery are:: return to facility      Discharge Placement              Patient chooses bed at: Other - please specify in the comment section below: ([facility]) Patient to be transferred to facility by: [place] Name of family member notified: faison,delexstie (Other)   [phone] Patient and family notified of of transfer: 02/06/21  Discharge Plan and Services                                     Social Determinants of Health (SDOH) Interventions     Readmission Risk Interventions No flowsheet data found.
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
## 2021-02-06 NOTE — Progress Notes (Signed)
Called report too Social research officer, government @ [phone].

## 2021-02-06 NOTE — Discharge Instructions (Signed)
Thrombocytopenia Thrombocytopenia means that you have a low number of platelets in your blood. Platelets are tiny cells in the blood. When you bleed, they clump together at the cut or injury to stop the bleeding. This is called blood clotting. If you do not have enough platelets, it can cause bleeding problems. Some cases of this condition are mild while others are more severe. What are the causes? This condition may be caused by:  Your body not making enough platelets. This may be caused by: ? Your bone marrow not making blood cells (aplastic anemia). ? Cancer in the bone marrow. ? Certain medicines. ? Infection in the bone marrow. ? Drinking a lot of alcohol.  Your body destroying platelets too quickly. This may be caused by: ? Certain immune diseases. ? Certain medicines. ? Certain blood clotting disorders. ? Certain disorders that are passed from parent to child (inherited). ? Certain bleeding disorders. ? Pregnancy. ? Having a spleen that is larger than normal. What are the signs or symptoms?  Bleeding that is not normal.  Nosebleeds.  Heavy menstrual periods.  Blood in the pee (urine) or poop (stool).  A purple-like color to the skin (purpura).  Bruising.  A rash that looks like pinpoint, purple-red spots (petechiae). How is this treated?  Treatment of another condition that is causing the low platelet count.  Medicines to help protect your platelets from being destroyed.  A replacement (transfusion) of platelets to stop or prevent bleeding.  Surgery to remove the spleen. Follow these instructions at home: Activity  Avoid activities that could cause you to get hurt or bruised. Follow instructions about how to prevent falls.  Take care not to cut yourself: ? When you shave. ? When you use scissors, needles, knives, or other tools.  Take care not to burn yourself: ? When you use an iron. ? When you cook. General instructions  Check your skin and the  inside of your mouth for bruises or blood as told by your doctor.  Check to see if there is blood in your spit (sputum), pee, and poop. Do this as told by your doctor.  Do not drink alcohol.  Take over-the-counter and prescription medicines only as told by your doctor.  Do not take any medicines that have aspirin or NSAIDs in them. These medicines can thin your blood and cause you to bleed.  Tell all of your doctors that you have this condition. Be sure to tell your dentist and eye doctor too.   Contact a doctor if:  You have bruises and you do not know why. Get help right away if:  You are bleeding anywhere on your body.  You have blood in your spit, pee, or poop. Summary  Thrombocytopenia means that you have a low number of platelets in your blood.  Platelets are needed for blood clotting.  Symptoms of this condition include bleeding that is not normal, and bruising.  Take care not to cut or burn yourself. This information is not intended to replace advice given to you by your health care provider. Make sure you discuss any questions you have with your health care provider. Document Revised: 06/30/2018 Document Reviewed: 06/30/2018 Elsevier Patient Education  2021 Rankin for Falls Each year, millions of people have serious injuries from falls. It is important to understand your risk for falling. Talk with your health care provider about your risk and what you can do to lower it. There are actions you can  take at home to lower your risk and prevent falls. If you do have a serious fall, make sure to tell your health care provider. Falling once raises your risk of falling again. How can falls affect me? Serious injuries from falls are common. These include:  Broken bones, such as hip fractures.  Head injuries, such as traumatic brain injuries (TBI) or concussion. A fear of falling can cause you to avoid activities and stay at home. This can  make your muscles weaker and actually raise your risk for a fall. What can increase my risk? There are a number of risk factors that increase your risk for falling. The more risk factors you have, the higher your risk of falling. Serious injuries from a fall happen most often to people older than age 8. Children and young adults ages 44-29 are also at higher risk. Common risk factors include:  Weakness in the lower body.  Lack (deficiency) of vitamin D.  Being generally weak or confused due to long-term (chronic) illness.  Dizziness or balance problems.  Poor vision.  Medicines that cause dizziness or drowsiness. These can include medicines for your blood pressure, heart, anxiety, insomnia, or edema, as well as pain medicines and muscle relaxants. Other risk factors include:  Drinking alcohol.  Having had a fall in the past.  Having depression.  Having foot pain or wearing improper footwear.  Working at a dangerous job.  Having any of the following in your home: ? Tripping hazards, such as floor clutter or loose rugs. ? Poor lighting. ? Pets.  Having dementia or memory loss. What actions can I take to lower my risk of falling? Physical activity Maintain physical fitness. Do strength and balance exercises. Consider taking a regular class to build strength and balance. Yoga and tai chi are good options. Vision Have your eyes checked every year and your vision prescription updated as needed. Walking aids and footwear  Wear nonskid shoes. Do not wear high heels.  Do not walk around the house in socks or slippers.  Use a cane or walker as told by your health care provider. Home safety  Attach secure railings on both sides of your stairs.  Install grab bars for your tub, shower, and toilet. Use a bath mat in your tub or shower.  Use good lighting in all rooms. Keep a flashlight near your bed.  Make sure there is a clear path from your bed to the bathroom. Use  night-lights.  Do not use throw rugs. Make sure all carpeting is taped or tacked down securely.  Remove all clutter from walkways and stairways, including extension cords.  Repair uneven or broken steps.  Avoid walking on icy or slippery surfaces. Walk on the grass instead of on icy or slick sidewalks. Use ice melt to get rid of ice on walkways.  Use a cordless phone.      Questions to ask your health care provider  Can you help me check my risk for a fall?  Do any of my medicines make me more likely to fall?  Should I take a vitamin D supplement?  What exercises can I do to improve my strength and balance?  Should I make an appointment to have my vision checked?  Do I need a bone density test to check for weak bones or osteoporosis?  Would it help to use a cane or a walker? Where to find more information  Centers for Disease Control and Prevention, STEADI: http://www.wolf.info/  Community-Based Fall Prevention  Programs: http://www.wolf.info/  National Institute on Aging: http://kim-miller.com/ Contact a health care provider if:  You fall at home.  You are afraid of falling at home.  You feel weak, drowsy, or dizzy. Summary  Serious injuries from a fall happen most often to people older than age 56. Children and young adults ages 72-29 are also at higher risk.  Talk with your health care provider about your risks for falling and how to lower those risks.  Taking certain precautions at home can lower your risk for falling.  If you fall, always tell your health care provider. This information is not intended to replace advice given to you by your health care provider. Make sure you discuss any questions you have with your health care provider. Document Revised: 05/01/2020 Document Reviewed: 05/01/2020 Elsevier Patient Education  [place].

## 2021-02-06 NOTE — Discharge Summary (Signed)
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 LDLDIRECT in the last 72 hours. Thyroid function studies No results for input(s): TSH, T4TOTAL, T3FREE, THYROIDAB in the last 72 hours.  Invalid input(s): FREET3 Anemia work up No results for input(s): VITAMINB12, FOLATE, FERRITIN, TIBC, IRON, RETICCTPCT in the last 72 hours. Urinalysis    Component Value Date/Time   COLORURINE YELLOW (A) 01/06/2021 [id number]  APPEARANCEUR CLEAR (A) 01/06/2021 0035   APPEARANCEUR Clear 09/03/2014 1200   LABSPEC 1.008 01/06/2021 0035   LABSPEC 1.018 09/03/2014 1200   PHURINE 6.0 01/06/2021 0035   GLUCOSEU NEGATIVE 01/06/2021 0035   GLUCOSEU Negative 09/03/2014 1200   HGBUR NEGATIVE 01/06/2021 0035   BILIRUBINUR NEGATIVE  01/06/2021 0035   BILIRUBINUR Negative 09/03/2014 1200   KETONESUR NEGATIVE 01/06/2021 0035   PROTEINUR NEGATIVE 01/06/2021 0035   NITRITE NEGATIVE 01/06/2021 0035   LEUKOCYTESUR NEGATIVE 01/06/2021 0035   LEUKOCYTESUR Negative 09/03/2014 1200   Sepsis Labs Invalid input(s): PROCALCITONIN,  WBC,  LACTICIDVEN Microbiology Recent Results (from the past 240 hour(s))  Resp Panel by RT-PCR (Flu A&B, Covid) Nasopharyngeal Swab     Status: None   Collection Time: 02/03/21  2:00 PM   Specimen: Nasopharyngeal Swab; Nasopharyngeal(NP) swabs in vial transport medium  Result Value Ref Range Status   SARS Coronavirus 2 by RT PCR NEGATIVE NEGATIVE Final    Comment: (NOTE) SARS-CoV-2 target nucleic acids are NOT DETECTED.  The SARS-CoV-2 RNA is generally detectable in upper respiratory specimens during the acute phase of infection. The lowest concentration of SARS-CoV-2 viral copies this assay can detect is 138 copies/mL. A negative result does not preclude SARS-Cov-2 infection and should not be used as the sole basis for treatment or other patient management decisions. A negative result may occur with  improper specimen collection/handling, submission of specimen other than nasopharyngeal swab, presence of viral mutation(s) within the areas targeted by this assay, and inadequate number of viral copies(<138 copies/mL). A negative result must be combined with clinical observations, patient history, and epidemiological information. The expected result is Negative.  Fact Sheet for Patients:  EntrepreneurPulse.com.au  Fact Sheet for Healthcare Providers:  IncredibleEmployment.be  This test is no t yet approved or cleared by the [place] FDA and  has been authorized for detection and/or diagnosis of SARS-CoV-2 by FDA under an Emergency Use Authorization (EUA). This EUA will remain  in effect (meaning this test can be used) for the duration of  the COVID-19 declaration under Section 564(b)(1) of the Act, 21 U.S.C.section 360bbb-3(b)(1), unless the authorization is terminated  or revoked sooner.       Influenza A by PCR NEGATIVE NEGATIVE Final   Influenza B by PCR NEGATIVE NEGATIVE Final    Comment: (NOTE) The Xpert Xpress SARS-CoV-2/FLU/RSV plus assay is intended as an aid in the diagnosis of influenza from Nasopharyngeal swab specimens and should not be used as a sole basis for treatment. Nasal washings and aspirates are unacceptable for Xpert Xpress SARS-CoV-2/FLU/RSV testing.  Fact Sheet for Patients: EntrepreneurPulse.com.au  Fact Sheet for Healthcare Providers: IncredibleEmployment.be  This test is not yet approved or cleared by the [place] FDA and has been authorized for detection and/or diagnosis of SARS-CoV-2 by FDA under an Emergency Use Authorization (EUA). This EUA will remain in effect (meaning this test can be used) for the duration of the COVID-19 declaration under Section 564(b)(1) of the Act, 21 U.S.C. section 360bbb-3(b)(1), unless the authorization is terminated or revoked.  Performed at [facility], [address] [place], [place] [address]    Time coordinating discharge: 50 mins  SIGNED:  Irwin Brakeman, MD  [facility] 02/06/2021, 12:25 PM How to contact the [facility] Attending or Consulting provider [place] or covering provider during after hours [place], for this patient?  1. Check the care team in [facility] and look for a) attending/consulting TR[facility] provider listed and b) the [facility] team listed 2. Log into  [email or web address] and use [place]'s universal password to access. If you do not have the password, please contact the hospital operator. 3. Locate the [facility] provider you are looking for under [facility] and page to a number that you can be directly reached. 4. If you still have difficulty reaching the provider, please page the [facility] (Director on Call)  for the Hospitalists listed on amion for assistance.

## 2021-02-10 DIAGNOSIS — R52 Pain, unspecified: Secondary | ICD-10-CM | POA: Diagnosis not present

## 2021-02-10 DIAGNOSIS — J441 Chronic obstructive pulmonary disease with (acute) exacerbation: Secondary | ICD-10-CM | POA: Diagnosis not present

## 2021-02-10 DIAGNOSIS — K219 Gastro-esophageal reflux disease without esophagitis: Secondary | ICD-10-CM | POA: Diagnosis not present

## 2021-02-10 DIAGNOSIS — Z79899 Other long term (current) drug therapy: Secondary | ICD-10-CM | POA: Diagnosis not present

## 2021-02-10 DIAGNOSIS — E039 Hypothyroidism, unspecified: Secondary | ICD-10-CM | POA: Diagnosis not present

## 2021-02-10 DIAGNOSIS — G629 Polyneuropathy, unspecified: Secondary | ICD-10-CM | POA: Diagnosis not present

## 2021-02-11 DIAGNOSIS — D696 Thrombocytopenia, unspecified: Secondary | ICD-10-CM | POA: Diagnosis not present

## 2021-02-13 DIAGNOSIS — R52 Pain, unspecified: Secondary | ICD-10-CM | POA: Diagnosis not present

## 2021-02-13 DIAGNOSIS — Z79899 Other long term (current) drug therapy: Secondary | ICD-10-CM | POA: Diagnosis not present

## 2021-02-13 DIAGNOSIS — G629 Polyneuropathy, unspecified: Secondary | ICD-10-CM | POA: Diagnosis not present

## 2021-02-13 DIAGNOSIS — R5381 Other malaise: Secondary | ICD-10-CM | POA: Diagnosis not present

## 2021-02-20 ENCOUNTER — Other Ambulatory Visit: Payer: Self-pay
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
## 2021-02-25 ENCOUNTER — Other Ambulatory Visit: Payer: Self-pay

## 2021-02-25 NOTE — Patient Outreach (Signed)
[facility]) Care Management  02/25/2021  Ariana [name] [birth year] [id number]     Transition of Care Referral  Referral Date: 02/25/2021 Referral Source: [employer] Discharge Report Date of Discharge: 8/76/8115 [facility]: [facility]    RN CM spoke with patient's legal guardian/case worker([place]). Confirmed with her that patient has not been discharged and remains at [facility]. She voices that there are no plans for discharge and patient will be there for long term care.    Plan: RN CM will close referral at this time.    Enzo Montgomery, RN,BSN,CCM [place] Management Telephonic Care Management Coordinator Direct Phone: [phone] Toll Free: [phone] Fax: [phone]

## 2021-02-27 NOTE — Progress Notes (Signed)
[facility] [place]  Telephone:([phone]) [phone]  Fax:([phone]) [phone]     Ariana [name] DOB: 1933-07-31  MR#: [id number]  AJO#:[id number]  Patient Care Team: Caprice Renshaw, MD as PCP - General (Internal Medicine) Lloyd Huger, MD as Consulting Physician (Oncology)   CHIEF COMPLAINT: Chronic refractory ITP  INTERVAL HISTORY: Patient returns to clinic today for repeat laboratory work, further evaluation, and continuation of Nplate.  She continues to have chronic weakness and fatigue.  She continues to have a poor memory and increased confusion. She denies any easy bleeding or bruising.  She has no neurologic complaints.  She denies any recent fevers or illnesses. She denies any chest pain, shortness of breath, cough, or hemoptysis.  She denies any nausea, vomiting, constipation, or diarrhea.  She has no melena or hematochezia.  She has no urinary complaints.  Patient offers no further specific complaints today.  REVIEW OF SYSTEMS:   Review of Systems  Constitutional: Positive for malaise/fatigue. Negative for fever and weight loss.  HENT: Negative for congestion.   Respiratory: Negative.  Negative for cough, hemoptysis and shortness of breath.   Cardiovascular: Negative.  Negative for chest pain and leg swelling.  Gastrointestinal: Negative.  Negative for abdominal pain, blood in stool, diarrhea, melena and nausea.  Genitourinary: Negative.  Negative for dysuria and hematuria.  Musculoskeletal: Negative.  Negative for back pain, falls and joint pain.  Skin: Negative.  Negative for rash.  Neurological: Positive for weakness. Negative for dizziness, sensory change, focal weakness and headaches.  Endo/Heme/Allergies: Does not bruise/bleed easily.  Psychiatric/Behavioral: Positive for memory loss. Negative for depression. The patient is not nervous/anxious.     As per HPI. Otherwise, a complete review of systems is negative.  PAST MEDICAL HISTORY: Past Medical History:   Diagnosis Date  . Alzheimer disease ([place])   . Anemia   . Anxiety   . Asthma    WELL CONTROLLED  . Chronic back pain   . Cognitive communication deficit   . COPD (chronic obstructive pulmonary disease) ([place])   . Depression   . Depression   . GERD (gastroesophageal reflux disease)   . Hiatal hernia   . Hypercholesteremia   . Hypothyroidism   . Iron deficiency   . ITP (idiopathic thrombocytopenic purpura)    FOLLOWED BR DR Grayland Ormond  . LBBB (left bundle branch block)   . Osteoarthritis   . Osteoporosis   . Restless legs     PAST SURGICAL HISTORY: Past Surgical History:  Procedure Laterality Date  . CARPAL TUNNEL RELEASE    . ESOPHAGOGASTRODUODENOSCOPY (EGD) WITH PROPOFOL N/A 09/10/2015   Procedure: ESOPHAGOGASTRODUODENOSCOPY (EGD) WITH PROPOFOL;  Surgeon: Lollie Sails, MD;  Location: [facility] ENDOSCOPY;  Service: Endoscopy;  Laterality: N/A;  . HIP PINNING,CANNULATED Left 10/31/2020   Procedure: CANNULATED HIP PINNING;  Surgeon: Leim Fabry, MD;  Location: [facility] ORS;  Service: Orthopedics;  Laterality: Left;  . IRRIGATION AND DEBRIDEMENT HEMATOMA Right 07/13/2018   Procedure: IRRIGATION AND DEBRIDEMENT HEMATOMA-RIGHT SHIN;  Surgeon: Herbert Pun, MD;  Location: [facility] ORS;  Service: General;  Laterality: Right;  . KYPHOPLASTY N/A 05/02/2019   Procedure: L1 KYPHOPLASTY;  Surgeon: Hessie Knows, MD;  Location: [facility] ORS;  Service: Orthopedics;  Laterality: N/A;  . LUMBAR [place]    . REVERSE SHOULDER ARTHROPLASTY Left 06/25/2020   Procedure: REVERSE SHOULDER ARTHROPLASTY;  Surgeon: Corky Mull, MD;  Location: [facility] ORS;  Service: Orthopedics;  Laterality: Left;  . SPLENECTOMY, PARTIAL    . TOTAL ABDOMINAL HYSTERECTOMY  FAMILY HISTORY Family History  Problem Relation Age of Onset  . Stroke Mother     GYNECOLOGIC HISTORY:  No LMP recorded. Patient has had a hysterectomy.     ADVANCED DIRECTIVES:    HEALTH MAINTENANCE: Social History   Tobacco Use  .  Smoking status: Never Smoker  . Smokeless tobacco: Never Used  Vaping Use  . Vaping Use: Never used  Substance Use Topics  . Alcohol use: No    Alcohol/week: 0.0 standard drinks  . Drug use: No     Allergies  Allergen Reactions  . Aspirin     Upset stomach  . Diazepam Itching    sneezing  . Other     seasonal allergies  . Tetanus Toxoids     Localized superficial swelling of skin  . Morphine Itching and Rash    Current Outpatient Medications  Medication Sig Dispense Refill  . acetaminophen (TYLENOL) 325 MG tablet Take 2 tablets (650 mg total) by mouth every 6 (six) hours as needed for mild pain (or Fever >/= 101). 100 tablet 0  . ALPRAZolam (XANAX) 0.5 MG tablet Take 1 tablet (0.5 mg total) by mouth 3 (three) times daily as needed for anxiety. 15 tablet 0  . amLODipine (NORVASC) 10 MG tablet Take 1 tablet (10 mg total) by mouth daily.    . busPIRone (BUSPAR) 7.5 MG tablet Take 1 tablet (7.5 mg total) by mouth 2 (two) times daily.    . cholecalciferol (VITAMIN D3) 25 MCG (1000 UNIT) tablet Take 2,000 Units by mouth daily.    . citalopram (CELEXA) 20 MG tablet Take 1 tablet (20 mg total) by mouth every morning.    . Cyanocobalamin 1000 MCG TBCR Take 1,000 mcg by mouth daily.     Marland Kitchen gabapentin (NEURONTIN) 100 MG capsule Take 2 capsules (200 mg total) by mouth 4 (four) times daily.    Marland Kitchen levothyroxine (SYNTHROID, LEVOTHROID) 50 MCG tablet Take 50 mcg by mouth daily before breakfast. Take 30 to 60 minutes before breakfast.    . loratadine (CLARITIN) 10 MG tablet Take 1 tablet (10 mg total) by mouth daily.    . memantine (NAMENDA) 10 MG tablet Take 10 mg by mouth 2 (two) times daily.    . montelukast (SINGULAIR) 10 MG tablet Take 10 mg by mouth at bedtime.    . Multiple Vitamin (MULTI-VITAMIN) tablet Take 1 tablet by mouth daily.    . Multiple Vitamins-Minerals (PRESERVISION AREDS 2) CAPS Take 1 capsule by mouth 2 (two) times a day.    . oxybutynin (DITROPAN) 5 MG tablet Take 5 mg by  mouth 3 (three) times daily.    . pantoprazole (PROTONIX) 20 MG tablet Take 20 mg by mouth 2 (two) times daily.     . rosuvastatin (CRESTOR) 5 MG tablet Take 5 mg by mouth daily.    . vitamin E 180 MG (400 UNITS) capsule Take 400 Units by mouth daily.    Marland Kitchen albuterol (PROVENTIL HFA;VENTOLIN HFA) 108 (90 BASE) MCG/ACT inhaler Inhale 2 puffs into the lungs every 6 (six) hours as needed for wheezing or shortness of breath.    . fluticasone (FLONASE) 50 MCG/ACT nasal spray Place 2 sprays into both nostrils daily as needed for allergies. (Patient not taking: Reported on 03/05/2021)    . Fluticasone-Salmeterol (ADVAIR) 250-50 MCG/DOSE AEPB Inhale 1 puff into the lungs 2 (two) times daily as needed (shortness of breath).     . iron polysaccharides (NIFEREX) 150 MG capsule Take 1 capsule (150 mg total)  by mouth daily.    Marland Kitchen lamoTRIgine (LAMICTAL) 25 MG tablet Take 50 mg by mouth at bedtime.    . ondansetron (ZOFRAN) 4 MG tablet Take 1 tablet (4 mg total) by mouth every 6 (six) hours as needed for nausea. (Patient not taking: Reported on 03/05/2021) 20 tablet 0  . QUEtiapine (SEROQUEL) 25 MG tablet Take 0.5 tablets (12.5 mg total) by mouth every 6 (six) hours as needed (agitation).     No current facility-administered medications for this visit.    OBJECTIVE: BP (!) 107/53 (BP Location: Left Arm, Patient Position: Sitting, Cuff Size: Normal)   Pulse (!) 49   Temp (!) 96.1 F (35.6 C)   Ht 5\' 2"  (1.575 m)   Wt 120 lb 12.8 oz (54.8 kg)   SpO2 100%   BMI 22.09 kg/m    Body mass index is 22.09 kg/m.    ECOG FS:2 - Symptomatic, <50% confined to bed  General: Thin, no acute distress.  Sitting in wheelchair. Eyes: Pink conjunctiva, anicteric sclera. HEENT: Normocephalic, moist mucous membranes. Lungs: No audible wheezing or coughing. Heart: Regular rate and rhythm. Abdomen: Soft, nontender, no obvious distention. Musculoskeletal: No edema, cyanosis, or clubbing. Neuro: Alert, answering all questions  appropriately. Cranial nerves grossly intact. Skin: No rashes or petechiae noted. Psych: Normal affect.  LAB RESULTS:  Appointment on 03/05/2021  Component Date Value Ref Range Status  . WBC 03/05/2021 6.4  4.0 - 10.5 K/uL Final  . RBC 03/05/2021 4.36  3.87 - 5.11 MIL/uL Final  . Hemoglobin 03/05/2021 12.4  12.0 - 15.0 g/dL Final  . HCT 03/05/2021 39.3  36.0 - 46.0 % Final  . MCV 03/05/2021 90.1  80.0 - 100.0 fL Final  . MCH 03/05/2021 28.4  26.0 - 34.0 pg Final  . MCHC 03/05/2021 31.6  30.0 - 36.0 g/dL Final  . RDW 03/05/2021 17.8* 11.5 - 15.5 % Final  . Platelets 03/05/2021 13* 150 - 400 K/uL Final   Comment: Immature Platelet Fraction may be clinically indicated, consider ordering this additional test [id number] THIS CRITICAL RESULT HAS VERIFIED AND BEEN CALLED TO [facility] BY CHASE ISLEY ON 05 25 2022 AT 1123, AND HAS BEEN READ BACK.    Marland Kitchen nRBC 03/05/2021 0.0  0.0 - 0.2 % Final  . Neutrophils Relative % 03/05/2021 47  % Final  . Neutro Abs 03/05/2021 3.0  1.7 - 7.7 K/uL Final  . Lymphocytes Relative 03/05/2021 41  % Final  . Lymphs Abs 03/05/2021 2.6  0.7 - 4.0 K/uL Final  . Monocytes Relative 03/05/2021 9  % Final  . Monocytes Absolute 03/05/2021 0.6  0.1 - 1.0 K/uL Final  . Eosinophils Relative 03/05/2021 2  % Final  . Eosinophils Absolute 03/05/2021 0.2  0.0 - 0.5 K/uL Final  . Basophils Relative 03/05/2021 1  % Final  . Basophils Absolute 03/05/2021 0.1  0.0 - 0.1 K/uL Final  . RBC Morphology 03/05/2021 MORPHOLOGY UNREMARKABLE   Final  . Smear Review 03/05/2021 PLATELETS APPEAR DECREASED   Final   PLATELET COUNT CONFIRMED BY SMEAR  . Immature Granulocytes 03/05/2021 0  % Final  . Abs Immature Granulocytes 03/05/2021 0.01  0.00 - 0.07 K/uL Final  . Giant PLTs 03/05/2021 PRESENT   Final   Performed at [facility], [address] [place], [place] [address]    STUDIES: No results found.  ASSESSMENT:  Chronic refractory ITP  PLAN:   1. Chronic refractory ITP:  Patient had a poor response to Prednisone, WinRho, and Rituxan. She  is status post splenectomy in 2007.  She received IVIG and Nplate while in the hospital with significant improvement of her platelet count.   If Nplate no longer was effective, could retry IVIG at a future date.  Patient is currently receiving the max dose of Nplate at 10 mcg/kg.  Can also consider Promacta 12.5mg  or Tavalisse 100 mg BID if needed.  Patient's platelet count is 13 today, therefore will proceed with Nplate.  Return to clinic in 4 weeks for further evaluation and continuation of treatment.   2. Weakness and fatigue: Chronic and unchanged. 3.  Dementia: Chronic and unchanged. 4.  Leukocytosis: Resolved. 5.  Anemia: Resolved. 6.  Fractured hip: Continue follow-up with orthopedics as scheduled.  Continue exercises provided by physical therapy as above.  Patient expressed understanding and was in agreement with this plan. She also understands that She can call clinic at any time with any questions, concerns, or complaints.    Lloyd Huger, MD   03/06/2021 8:56 AM

## 2021-03-03 DIAGNOSIS — K219 Gastro-esophageal reflux disease without esophagitis: Secondary | ICD-10-CM | POA: Diagnosis not present

## 2021-03-03 DIAGNOSIS — E039 Hypothyroidism, unspecified: Secondary | ICD-10-CM | POA: Diagnosis not present

## 2021-03-03 DIAGNOSIS — H1033 Unspecified acute conjunctivitis, bilateral: Secondary | ICD-10-CM | POA: Diagnosis not present

## 2021-03-03 DIAGNOSIS — J441 Chronic obstructive pulmonary disease with (acute) exacerbation: Secondary | ICD-10-CM | POA: Diagnosis not present

## 2021-03-03 DIAGNOSIS — F0281 Dementia in other diseases classified elsewhere with behavioral disturbance: Secondary | ICD-10-CM | POA: Diagnosis not present

## 2021-03-05 ENCOUNTER — Other Ambulatory Visit: Payer: Self-pay

## 2021-03-05 ENCOUNTER — Inpatient Hospital Stay: Payer: Medicare HMO | Attending: Oncology | Admitting: Oncology

## 2021-03-05 ENCOUNTER — Inpatient Hospital Stay: Payer: Medicare HMO

## 2021-03-05 ENCOUNTER — Encounter: Payer: Self-pay | Admitting: Oncology

## 2021-03-05 VITALS — BP 107/53 | HR 49 | Temp 96.1°F | Ht 62.0 in | Wt 120.8 lb

## 2021-03-05 DIAGNOSIS — D693 Immune thrombocytopenic purpura: Secondary | ICD-10-CM | POA: Insufficient documentation

## 2021-03-05 LAB — CBC WITH DIFFERENTIAL/PLATELET
Abs Immature Granulocytes: 0.01 10*3/uL (ref 0.00–0.07)
Basophils Absolute: 0.1 10*3/uL (ref 0.0–0.1)
Basophils Relative: 1 %
Eosinophils Absolute: 0.2 10*3/uL (ref 0.0–0.5)
Eosinophils Relative: 2 %
HCT: 39.3 % (ref 36.0–46.0)
Hemoglobin: 12.4 g/dL (ref 12.0–15.0)
Immature Granulocytes: 0 %
Lymphocytes Relative: 41 %
Lymphs Abs: 2.6 10*3/uL (ref 0.7–4.0)
MCH: 28.4 pg (ref 26.0–34.0)
MCHC: 31.6 g/dL (ref 30.0–36.0)
MCV: 90.1 fL (ref 80.0–100.0)
Monocytes Absolute: 0.6 10*3/uL (ref 0.1–1.0)
Monocytes Relative: 9 %
Neutro Abs: 3 10*3/uL (ref 1.7–7.7)
Neutrophils Relative %: 47 %
Platelets: 13 10*3/uL — CL (ref 150–400)
RBC: 4.36 MIL/uL (ref 3.87–5.11)
RDW: 17.8 % — ABNORMAL HIGH (ref 11.5–15.5)
Smear Review: DECREASED
WBC: 6.4 10*3/uL (ref 4.0–10.5)
nRBC: 0 % (ref 0.0–0.2)

## 2021-03-05 MED ORDER — ROMIPLOSTIM INJECTION 500 MCG
10.0000 ug/kg | Freq: Once | SUBCUTANEOUS | Status: AC
  Administered 2021-03-05: 550 ug via SUBCUTANEOUS
  Filled 2021-03-05: qty 1.1

## 2021-03-06 ENCOUNTER — Encounter: Payer: Self-pay | Admitting: Oncology

## 2021-03-06 DIAGNOSIS — R5381 Other malaise: Secondary | ICD-10-CM | POA: Diagnosis not present

## 2021-03-06 DIAGNOSIS — R52 Pain, unspecified: Secondary | ICD-10-CM | POA: Diagnosis not present

## 2021-03-06 DIAGNOSIS — G629 Polyneuropathy, unspecified: Secondary | ICD-10-CM | POA: Diagnosis not present

## 2021-03-06 DIAGNOSIS — K219 Gastro-esophageal reflux disease without esophagitis: Secondary | ICD-10-CM | POA: Diagnosis not present

## 2021-03-06 DIAGNOSIS — Z23 Encounter for immunization: Secondary | ICD-10-CM | POA: Diagnosis not present

## 2021-03-06 DIAGNOSIS — Z79899 Other long term (current) drug therapy: Secondary | ICD-10-CM | POA: Diagnosis not present

## 2021-03-06 DIAGNOSIS — E440 Moderate protein-calorie malnutrition: Secondary | ICD-10-CM | POA: Diagnosis not present

## 2021-03-06 DIAGNOSIS — E039 Hypothyroidism, unspecified: Secondary | ICD-10-CM | POA: Diagnosis not present

## 2021-03-06 DIAGNOSIS — F0281 Dementia in other diseases classified elsewhere with behavioral disturbance: Secondary | ICD-10-CM | POA: Diagnosis not present

## 2021-03-10 DIAGNOSIS — J441 Chronic obstructive pulmonary disease with (acute) exacerbation: Secondary | ICD-10-CM | POA: Diagnosis not present

## 2021-03-10 DIAGNOSIS — E039 Hypothyroidism, unspecified: Secondary | ICD-10-CM | POA: Diagnosis not present

## 2021-03-10 DIAGNOSIS — K219 Gastro-esophageal reflux disease without esophagitis: Secondary | ICD-10-CM | POA: Diagnosis not present

## 2021-03-10 DIAGNOSIS — G629 Polyneuropathy, unspecified: Secondary | ICD-10-CM | POA: Diagnosis not present

## 2021-03-10 DIAGNOSIS — F0281 Dementia in other diseases classified elsewhere with behavioral disturbance: Secondary | ICD-10-CM | POA: Diagnosis not present

## 2021-03-19 DIAGNOSIS — S72002D Fracture of unspecified part of neck of left femur, subsequent encounter for closed fracture with routine healing: Secondary | ICD-10-CM | POA: Diagnosis not present

## 2021-03-19 DIAGNOSIS — M25562 Pain in left knee: Secondary | ICD-10-CM | POA: Diagnosis not present

## 2021-03-26 DIAGNOSIS — M6281 Muscle weakness (generalized): Secondary | ICD-10-CM | POA: Diagnosis not present

## 2021-03-26 DIAGNOSIS — R1312 Dysphagia, oropharyngeal phase: Secondary | ICD-10-CM | POA: Diagnosis not present

## 2021-03-26 DIAGNOSIS — F0281 Dementia in other diseases classified elsewhere with behavioral disturbance: Secondary | ICD-10-CM | POA: Diagnosis not present

## 2021-03-27 DIAGNOSIS — R1312 Dysphagia, oropharyngeal phase: Secondary | ICD-10-CM | POA: Diagnosis not present

## 2021-03-27 DIAGNOSIS — F0281 Dementia in other diseases classified elsewhere with behavioral disturbance: Secondary | ICD-10-CM | POA: Diagnosis not present

## 2021-03-27 DIAGNOSIS — M6281 Muscle weakness (generalized): Secondary | ICD-10-CM | POA: Diagnosis not present

## 2021-03-28 DIAGNOSIS — R1312 Dysphagia, oropharyngeal phase: Secondary | ICD-10-CM | POA: Diagnosis not present

## 2021-03-28 DIAGNOSIS — F0281 Dementia in other diseases classified elsewhere with behavioral disturbance: Secondary | ICD-10-CM | POA: Diagnosis not present

## 2021-03-28 DIAGNOSIS — M6281 Muscle weakness (generalized): Secondary | ICD-10-CM | POA: Diagnosis not present

## 2021-03-29 DIAGNOSIS — R1312 Dysphagia, oropharyngeal phase: Secondary | ICD-10-CM | POA: Diagnosis not present

## 2021-03-29 DIAGNOSIS — M6281 Muscle weakness (generalized): Secondary | ICD-10-CM | POA: Diagnosis not present

## 2021-03-29 DIAGNOSIS — F0281 Dementia in other diseases classified elsewhere with behavioral disturbance: Secondary | ICD-10-CM | POA: Diagnosis not present

## 2021-03-30 DIAGNOSIS — M6281 Muscle weakness (generalized): Secondary | ICD-10-CM | POA: Diagnosis not present

## 2021-03-30 DIAGNOSIS — R1312 Dysphagia, oropharyngeal phase: Secondary | ICD-10-CM | POA: Diagnosis not present

## 2021-03-30 DIAGNOSIS — F0281 Dementia in other diseases classified elsewhere with behavioral disturbance: Secondary | ICD-10-CM | POA: Diagnosis not present

## 2021-03-31 DIAGNOSIS — F0281 Dementia in other diseases classified elsewhere with behavioral disturbance: Secondary | ICD-10-CM | POA: Diagnosis not present

## 2021-03-31 DIAGNOSIS — R5381 Other malaise: Secondary | ICD-10-CM | POA: Diagnosis not present

## 2021-03-31 DIAGNOSIS — F419 Anxiety disorder, unspecified: Secondary | ICD-10-CM | POA: Diagnosis not present

## 2021-03-31 DIAGNOSIS — G629 Polyneuropathy, unspecified: Secondary | ICD-10-CM | POA: Diagnosis not present

## 2021-03-31 DIAGNOSIS — Z79899 Other long term (current) drug therapy: Secondary | ICD-10-CM | POA: Diagnosis not present

## 2021-04-02 ENCOUNTER — Inpatient Hospital Stay: Payer: Medicare HMO

## 2021-04-02 ENCOUNTER — Other Ambulatory Visit: Payer: Self-pay

## 2021-04-02 ENCOUNTER — Inpatient Hospital Stay (HOSPITAL_BASED_OUTPATIENT_CLINIC_OR_DEPARTMENT_OTHER): Payer: Medicare HMO | Admitting: Oncology

## 2021-04-02 ENCOUNTER — Inpatient Hospital Stay: Payer: Medicare HMO | Attending: Oncology

## 2021-04-02 VITALS — BP 122/59 | HR 55 | Temp 97.6°F | Resp 18 | Wt 125.0 lb

## 2021-04-02 DIAGNOSIS — R1312 Dysphagia, oropharyngeal phase: Secondary | ICD-10-CM | POA: Diagnosis not present

## 2021-04-02 DIAGNOSIS — M6281 Muscle weakness (generalized): Secondary | ICD-10-CM | POA: Diagnosis not present

## 2021-04-02 DIAGNOSIS — D693 Immune thrombocytopenic purpura: Secondary | ICD-10-CM

## 2021-04-02 DIAGNOSIS — F0281 Dementia in other diseases classified elsewhere with behavioral disturbance: Secondary | ICD-10-CM | POA: Diagnosis not present

## 2021-04-02 LAB — CBC WITH DIFFERENTIAL/PLATELET
Abs Immature Granulocytes: 0.03 10*3/uL (ref 0.00–0.07)
Basophils Absolute: 0.1 10*3/uL (ref 0.0–0.1)
Basophils Relative: 1 %
Eosinophils Absolute: 0.2 10*3/uL (ref 0.0–0.5)
Eosinophils Relative: 2 %
HCT: 38.4 % (ref 36.0–46.0)
Hemoglobin: 12.8 g/dL (ref 12.0–15.0)
Immature Granulocytes: 0 %
Lymphocytes Relative: 28 %
Lymphs Abs: 2.4 10*3/uL (ref 0.7–4.0)
MCH: 29.8 pg (ref 26.0–34.0)
MCHC: 33.3 g/dL (ref 30.0–36.0)
MCV: 89.5 fL (ref 80.0–100.0)
Monocytes Absolute: 0.7 10*3/uL (ref 0.1–1.0)
Monocytes Relative: 8 %
Neutro Abs: 5.4 10*3/uL (ref 1.7–7.7)
Neutrophils Relative %: 61 %
Platelets: 12 10*3/uL — CL (ref 150–400)
RBC: 4.29 MIL/uL (ref 3.87–5.11)
RDW: 17.6 % — ABNORMAL HIGH (ref 11.5–15.5)
WBC: 8.7 10*3/uL (ref 4.0–10.5)
nRBC: 0 % (ref 0.0–0.2)

## 2021-04-02 MED ORDER — ROMIPLOSTIM INJECTION 500 MCG
550.0000 ug | Freq: Once | SUBCUTANEOUS | Status: AC
  Administered 2021-04-02: 550 ug via SUBCUTANEOUS
  Filled 2021-04-02: qty 1.1

## 2021-04-03 DIAGNOSIS — R1312 Dysphagia, oropharyngeal phase: Secondary | ICD-10-CM | POA: Diagnosis not present

## 2021-04-03 DIAGNOSIS — M6281 Muscle weakness (generalized): Secondary | ICD-10-CM | POA: Diagnosis not present

## 2021-04-03 DIAGNOSIS — F0281 Dementia in other diseases classified elsewhere with behavioral disturbance: Secondary | ICD-10-CM | POA: Diagnosis not present

## 2021-04-04 ENCOUNTER — Encounter: Payer: Self-pay | Admitting: Oncology

## 2021-04-04 NOTE — Progress Notes (Signed)
[facility]  [phone]  [phone]     Aldina Porta DOB: [birth year]  MR#: [id number]  AOZ#:[id number]  Patient Care Team: Caprice Renshaw, MD as PCP - General (Internal Medicine) Lloyd Huger, MD as Consulting Physician (Oncology)   CHIEF COMPLAINT: Chronic refractory ITP  INTERVAL HISTORY: Patient returns to clinic today for repeat laboratory work, further evaluation, and continuation of Nplate.  She seems more confused than baseline today.  She continues to have chronic weakness and fatigue. She denies any easy bleeding or bruising.  She has no neurologic complaints.  She denies any recent fevers or illnesses. She denies any chest pain, shortness of breath, cough, or hemoptysis.  She denies any nausea, vomiting, constipation, or diarrhea.  She has no melena or hematochezia.  She has no urinary complaints.  Patient offers no further specific complaints today.  REVIEW OF SYSTEMS:   Review of Systems  Constitutional:  Positive for malaise/fatigue. Negative for fever and weight loss.  HENT:  Negative for congestion.   Respiratory: Negative.  Negative for cough, hemoptysis and shortness of breath.   Cardiovascular: Negative.  Negative for chest pain and leg swelling.  Gastrointestinal: Negative.  Negative for abdominal pain, blood in stool, diarrhea, melena and nausea.  Genitourinary: Negative.  Negative for dysuria and hematuria.  Musculoskeletal: Negative.  Negative for back pain, falls and joint pain.  Skin: Negative.  Negative for rash.  Neurological:  Positive for weakness. Negative for dizziness, sensory change, focal weakness and headaches.  Endo/Heme/Allergies:  Does not bruise/bleed easily.  Psychiatric/Behavioral:  Positive for memory loss. Negative for depression. The patient is not nervous/anxious.    As per HPI. Otherwise, a complete review of systems is negative.  PAST MEDICAL HISTORY: Past Medical History:  Diagnosis Date    Alzheimer disease ([place])    Anemia    Anxiety    Asthma    WELL CONTROLLED   Chronic back pain    Cognitive communication deficit    COPD (chronic obstructive pulmonary disease) (HCC)    Depression    Depression    GERD (gastroesophageal reflux disease)    Hiatal hernia    Hypercholesteremia    Hypothyroidism    Iron deficiency    ITP (idiopathic thrombocytopenic purpura)    FOLLOWED BR DR Grayland Ormond   LBBB (left bundle branch block)    Osteoarthritis    Osteoporosis    Restless legs     PAST SURGICAL HISTORY: Past Surgical History:  Procedure Laterality Date   CARPAL TUNNEL RELEASE     ESOPHAGOGASTRODUODENOSCOPY (EGD) WITH PROPOFOL N/A 09/10/2015   Procedure: ESOPHAGOGASTRODUODENOSCOPY (EGD) WITH PROPOFOL;  Surgeon: Lollie Sails, MD;  Location: [facility] ENDOSCOPY;  Service: Endoscopy;  Laterality: N/A;   HIP PINNING,CANNULATED Left 10/31/2020   Procedure: CANNULATED HIP PINNING;  Surgeon: Leim Fabry, MD;  Location: [facility] ORS;  Service: Orthopedics;  Laterality: Left;   IRRIGATION AND DEBRIDEMENT HEMATOMA Right 07/13/2018   Procedure: IRRIGATION AND DEBRIDEMENT HEMATOMA-RIGHT SHIN;  Surgeon: Herbert Pun, MD;  Location: [facility] ORS;  Service: General;  Laterality: Right;   KYPHOPLASTY N/A 05/02/2019   Procedure: L1 KYPHOPLASTY;  Surgeon: Hessie Knows, MD;  Location: [facility] ORS;  Service: Orthopedics;  Laterality: N/A;   LUMBAR DISC SURGERY     REVERSE SHOULDER ARTHROPLASTY Left 06/25/2020   Procedure: REVERSE SHOULDER ARTHROPLASTY;  Surgeon: Corky Mull, MD;  Location: [facility] ORS;  Service: Orthopedics;  Laterality: Left;   SPLENECTOMY, PARTIAL     TOTAL ABDOMINAL HYSTERECTOMY  FAMILY HISTORY Family History  Problem Relation Age of Onset   Stroke Mother     GYNECOLOGIC HISTORY:  No LMP recorded. Patient has had a hysterectomy.     ADVANCED DIRECTIVES:    HEALTH MAINTENANCE: Social History   Tobacco Use   Smoking status: Never   Smokeless tobacco: Never   Vaping Use   Vaping Use: Never used  Substance Use Topics   Alcohol use: No    Alcohol/week: 0.0 standard drinks   Drug use: No     Allergies  Allergen Reactions   Aspirin     Upset stomach   Diazepam Itching    sneezing   Other     seasonal allergies   Tetanus Toxoids     Localized superficial swelling of skin   Morphine Itching and Rash    Current Outpatient Medications  Medication Sig Dispense Refill   acetaminophen (TYLENOL) 325 MG tablet Take 2 tablets (650 mg total) by mouth every 6 (six) hours as needed for mild pain (or Fever >/= 101). 100 tablet 0   albuterol (PROVENTIL HFA;VENTOLIN HFA) 108 (90 BASE) MCG/ACT inhaler Inhale 2 puffs into the lungs every 6 (six) hours as needed for wheezing or shortness of breath.     ALPRAZolam (XANAX) 0.5 MG tablet Take 1 tablet (0.5 mg total) by mouth 3 (three) times daily as needed for anxiety. 15 tablet 0   amLODipine (NORVASC) 10 MG tablet Take 1 tablet (10 mg total) by mouth daily.     busPIRone (BUSPAR) 7.5 MG tablet Take 1 tablet (7.5 mg total) by mouth 2 (two) times daily.     cholecalciferol (VITAMIN D3) 25 MCG (1000 UNIT) tablet Take 2,000 Units by mouth daily.     citalopram (CELEXA) 20 MG tablet Take 1 tablet (20 mg total) by mouth every morning.     Cyanocobalamin 1000 MCG TBCR Take 1,000 mcg by mouth daily.      Fluticasone-Salmeterol (ADVAIR) 250-50 MCG/DOSE AEPB Inhale 1 puff into the lungs 2 (two) times daily as needed (shortness of breath).      gabapentin (NEURONTIN) 100 MG capsule Take 2 capsules (200 mg total) by mouth 4 (four) times daily.     iron polysaccharides (NIFEREX) 150 MG capsule Take 1 capsule (150 mg total) by mouth daily.     lamoTRIgine (LAMICTAL) 25 MG tablet Take 50 mg by mouth at bedtime.     levothyroxine (SYNTHROID, LEVOTHROID) 50 MCG tablet Take 50 mcg by mouth daily before breakfast. Take 30 to 60 minutes before breakfast.     loratadine (CLARITIN) 10 MG tablet Take 1 tablet (10 mg total) by  mouth daily.     memantine (NAMENDA) 10 MG tablet Take 10 mg by mouth 2 (two) times daily.     montelukast (SINGULAIR) 10 MG tablet Take 10 mg by mouth at bedtime.     Multiple Vitamin (MULTI-VITAMIN) tablet Take 1 tablet by mouth daily.     Multiple Vitamins-Minerals (PRESERVISION AREDS 2) CAPS Take 1 capsule by mouth 2 (two) times a day.     oxybutynin (DITROPAN) 5 MG tablet Take 5 mg by mouth 3 (three) times daily.     pantoprazole (PROTONIX) 20 MG tablet Take 20 mg by mouth 2 (two) times daily.      QUEtiapine (SEROQUEL) 25 MG tablet Take 0.5 tablets (12.5 mg total) by mouth every 6 (six) hours as needed (agitation).     rosuvastatin (CRESTOR) 5 MG tablet Take 5 mg by mouth daily.  vitamin E 180 MG (400 UNITS) capsule Take 400 Units by mouth daily.     fluticasone (FLONASE) 50 MCG/ACT nasal spray Place 2 sprays into both nostrils daily as needed for allergies. (Patient not taking: No sig reported)     ondansetron (ZOFRAN) 4 MG tablet Take 1 tablet (4 mg total) by mouth every 6 (six) hours as needed for nausea. (Patient not taking: No sig reported) 20 tablet 0   No current facility-administered medications for this visit.    OBJECTIVE: BP (!) 122/59 (BP Location: Left Arm, Patient Position: Sitting)   Pulse (!) 55   Temp 97.6 F (36.4 C) (Tympanic)   Resp 18   Wt 125 lb (56.7 kg)   SpO2 99%   BMI 22.86 kg/m    Body mass index is 22.86 kg/m.    ECOG FS:2 - Symptomatic, <50% confined to bed  General: Thin, no acute distress. Eyes: Pink conjunctiva, anicteric sclera. HEENT: Normocephalic, moist mucous membranes. Lungs: No audible wheezing or coughing. Heart: Regular rate and rhythm. Abdomen: Soft, nontender, no obvious distention. Musculoskeletal: No edema, cyanosis, or clubbing. Neuro: Alert, answering all questions appropriately. Cranial nerves grossly intact. Skin: No rashes or petechiae noted. Psych: Normal affect.   LAB RESULTS:  Appointment on 04/02/2021  Component  Date Value Ref Range Status   WBC 04/02/2021 8.7  4.0 - 10.5 K/uL Final   RBC 04/02/2021 4.29  3.87 - 5.11 MIL/uL Final   Hemoglobin 04/02/2021 12.8  12.0 - 15.0 g/dL Final   HCT 04/02/2021 38.4  36.0 - 46.0 % Final   MCV 04/02/2021 89.5  80.0 - 100.0 fL Final   MCH 04/02/2021 29.8  26.0 - 34.0 pg Final   MCHC 04/02/2021 33.3  30.0 - 36.0 g/dL Final   RDW 04/02/2021 17.6 (A) 11.5 - 15.5 % Final   Platelets 04/02/2021 12 (A) 150 - 400 K/uL Final   Comment: Immature Platelet Fraction may be clinically indicated, consider ordering this additional test [id number] REPEATED TO VERIFY PLATELET COUNT CONFIRMED BY SMEAR THIS CRITICAL RESULT HAS VERIFIED AND BEEN CALLED TO [place] BY KIM ROOS ON 06 22 2022 AT 1045, AND HAS BEEN READ BACK.     nRBC 04/02/2021 0.0  0.0 - 0.2 % Final   Neutrophils Relative % 04/02/2021 61  % Final   Neutro Abs 04/02/2021 5.4  1.7 - 7.7 K/uL Final   Lymphocytes Relative 04/02/2021 28  % Final   Lymphs Abs 04/02/2021 2.4  0.7 - 4.0 K/uL Final   Monocytes Relative 04/02/2021 8  % Final   Monocytes Absolute 04/02/2021 0.7  0.1 - 1.0 K/uL Final   Eosinophils Relative 04/02/2021 2  % Final   Eosinophils Absolute 04/02/2021 0.2  0.0 - 0.5 K/uL Final   Basophils Relative 04/02/2021 1  % Final   Basophils Absolute 04/02/2021 0.1  0.0 - 0.1 K/uL Final   Immature Granulocytes 04/02/2021 0  % Final   Abs Immature Granulocytes 04/02/2021 0.03  0.00 - 0.07 K/uL Final   Performed at [facility], [address] [place], [place] [address]    STUDIES: No results found.  ASSESSMENT:  Chronic refractory ITP  PLAN:   1. Chronic refractory ITP: Patient had a poor response to Prednisone, WinRho, and Rituxan. She is status post splenectomy in 2007.  She received IVIG and Nplate while in the hospital with significant improvement of her platelet count.   If Nplate no longer was effective, could retry IVIG at a future date.  Patient is  currently receiving the max dose of  Nplate at 10 mcg/kg.  Can also consider Promacta 12.5mg  or Tavalisse 100 mg BID if needed.  Patient's platelet count is 12 today, the full proceed with Nplate as ordered.  Return to clinic in 4 weeks for further evaluation and continuation of treatment.   2. Weakness and fatigue: Chronic and unchanged. 3.  Dementia: Seems to be slightly worse today. 4.  Fractured hip: Continue follow-up with orthopedics as scheduled.  Continue exercises provided by physical therapy.  I spent a total of 30 minutes reviewing chart data, face-to-face evaluation with the patient, counseling and coordination of care as detailed above.   Patient expressed understanding and was in agreement with this plan. She also understands that She can call clinic at any time with any questions, concerns, or complaints.    Lloyd Huger, MD   04/04/2021 10:57 AM

## 2021-04-05 DIAGNOSIS — F0281 Dementia in other diseases classified elsewhere with behavioral disturbance: Secondary | ICD-10-CM | POA: Diagnosis not present

## 2021-04-05 DIAGNOSIS — R1312 Dysphagia, oropharyngeal phase: Secondary | ICD-10-CM | POA: Diagnosis not present

## 2021-04-05 DIAGNOSIS — M6281 Muscle weakness (generalized): Secondary | ICD-10-CM | POA: Diagnosis not present

## 2021-04-07 DIAGNOSIS — F0281 Dementia in other diseases classified elsewhere with behavioral disturbance: Secondary | ICD-10-CM | POA: Diagnosis not present

## 2021-04-07 DIAGNOSIS — M6281 Muscle weakness (generalized): Secondary | ICD-10-CM | POA: Diagnosis not present

## 2021-04-07 DIAGNOSIS — R1312 Dysphagia, oropharyngeal phase: Secondary | ICD-10-CM | POA: Diagnosis not present

## 2021-04-08 DIAGNOSIS — M6281 Muscle weakness (generalized): Secondary | ICD-10-CM | POA: Diagnosis not present

## 2021-04-08 DIAGNOSIS — F0281 Dementia in other diseases classified elsewhere with behavioral disturbance: Secondary | ICD-10-CM | POA: Diagnosis not present

## 2021-04-08 DIAGNOSIS — R1312 Dysphagia, oropharyngeal phase: Secondary | ICD-10-CM | POA: Diagnosis not present

## 2021-04-09 DIAGNOSIS — F0281 Dementia in other diseases classified elsewhere with behavioral disturbance: Secondary | ICD-10-CM | POA: Diagnosis not present

## 2021-04-09 DIAGNOSIS — K449 Diaphragmatic hernia without obstruction or gangrene: Secondary | ICD-10-CM

## 2021-04-09 DIAGNOSIS — M6281 Muscle weakness (generalized): Secondary | ICD-10-CM | POA: Diagnosis not present

## 2021-04-09 DIAGNOSIS — K219 Gastro-esophageal reflux disease without esophagitis: Secondary | ICD-10-CM | POA: Diagnosis present

## 2021-04-09 DIAGNOSIS — R1312 Dysphagia, oropharyngeal phase: Secondary | ICD-10-CM | POA: Diagnosis not present

## 2021-04-10 DIAGNOSIS — K219 Gastro-esophageal reflux disease without esophagitis: Secondary | ICD-10-CM | POA: Diagnosis not present

## 2021-04-10 DIAGNOSIS — F0281 Dementia in other diseases classified elsewhere with behavioral disturbance: Secondary | ICD-10-CM | POA: Diagnosis not present

## 2021-04-10 DIAGNOSIS — J441 Chronic obstructive pulmonary disease with (acute) exacerbation: Secondary | ICD-10-CM | POA: Diagnosis not present

## 2021-04-10 DIAGNOSIS — I10 Essential (primary) hypertension: Secondary | ICD-10-CM | POA: Diagnosis not present

## 2021-04-10 DIAGNOSIS — E039 Hypothyroidism, unspecified: Secondary | ICD-10-CM | POA: Diagnosis not present

## 2021-04-10 DIAGNOSIS — M6281 Muscle weakness (generalized): Secondary | ICD-10-CM | POA: Diagnosis not present

## 2021-04-10 DIAGNOSIS — R600 Localized edema: Secondary | ICD-10-CM | POA: Diagnosis not present

## 2021-04-10 DIAGNOSIS — R1312 Dysphagia, oropharyngeal phase: Secondary | ICD-10-CM | POA: Diagnosis not present

## 2021-04-10 DIAGNOSIS — R5381 Other malaise: Secondary | ICD-10-CM | POA: Diagnosis not present

## 2021-04-11 DIAGNOSIS — E119 Type 2 diabetes mellitus without complications: Secondary | ICD-10-CM | POA: Diagnosis not present

## 2021-04-11 DIAGNOSIS — M6281 Muscle weakness (generalized): Secondary | ICD-10-CM | POA: Diagnosis not present

## 2021-04-11 DIAGNOSIS — R1312 Dysphagia, oropharyngeal phase: Secondary | ICD-10-CM | POA: Diagnosis not present

## 2021-04-11 DIAGNOSIS — F0281 Dementia in other diseases classified elsewhere with behavioral disturbance: Secondary | ICD-10-CM | POA: Diagnosis not present

## 2021-04-15 DIAGNOSIS — R1312 Dysphagia, oropharyngeal phase: Secondary | ICD-10-CM | POA: Diagnosis not present

## 2021-04-15 DIAGNOSIS — D696 Thrombocytopenia, unspecified: Secondary | ICD-10-CM | POA: Diagnosis not present

## 2021-04-15 DIAGNOSIS — F0281 Dementia in other diseases classified elsewhere with behavioral disturbance: Secondary | ICD-10-CM | POA: Diagnosis not present

## 2021-04-15 DIAGNOSIS — M6281 Muscle weakness (generalized): Secondary | ICD-10-CM | POA: Diagnosis not present

## 2021-04-16 DIAGNOSIS — F0281 Dementia in other diseases classified elsewhere with behavioral disturbance: Secondary | ICD-10-CM | POA: Diagnosis not present

## 2021-04-16 DIAGNOSIS — S72002D Fracture of unspecified part of neck of left femur, subsequent encounter for closed fracture with routine healing: Secondary | ICD-10-CM | POA: Diagnosis not present

## 2021-04-16 DIAGNOSIS — M6281 Muscle weakness (generalized): Secondary | ICD-10-CM | POA: Diagnosis not present

## 2021-04-16 DIAGNOSIS — R1312 Dysphagia, oropharyngeal phase: Secondary | ICD-10-CM | POA: Diagnosis not present

## 2021-04-16 DIAGNOSIS — M87252 Osteonecrosis due to previous trauma, left femur: Secondary | ICD-10-CM | POA: Diagnosis not present

## 2021-04-19 DIAGNOSIS — F0281 Dementia in other diseases classified elsewhere with behavioral disturbance: Secondary | ICD-10-CM | POA: Diagnosis not present

## 2021-04-19 DIAGNOSIS — R1312 Dysphagia, oropharyngeal phase: Secondary | ICD-10-CM | POA: Diagnosis not present

## 2021-04-19 DIAGNOSIS — M6281 Muscle weakness (generalized): Secondary | ICD-10-CM | POA: Diagnosis not present

## 2021-04-20 DIAGNOSIS — F0281 Dementia in other diseases classified elsewhere with behavioral disturbance: Secondary | ICD-10-CM | POA: Diagnosis not present

## 2021-04-20 DIAGNOSIS — M6281 Muscle weakness (generalized): Secondary | ICD-10-CM | POA: Diagnosis not present

## 2021-04-20 DIAGNOSIS — R1312 Dysphagia, oropharyngeal phase: Secondary | ICD-10-CM | POA: Diagnosis not present

## 2021-04-21 DIAGNOSIS — R1312 Dysphagia, oropharyngeal phase: Secondary | ICD-10-CM | POA: Diagnosis not present

## 2021-04-21 DIAGNOSIS — F0281 Dementia in other diseases classified elsewhere with behavioral disturbance: Secondary | ICD-10-CM | POA: Diagnosis not present

## 2021-04-21 DIAGNOSIS — M6281 Muscle weakness (generalized): Secondary | ICD-10-CM | POA: Diagnosis not present

## 2021-04-22 DIAGNOSIS — M6281 Muscle weakness (generalized): Secondary | ICD-10-CM | POA: Diagnosis not present

## 2021-04-22 DIAGNOSIS — F0281 Dementia in other diseases classified elsewhere with behavioral disturbance: Secondary | ICD-10-CM | POA: Diagnosis not present

## 2021-04-22 DIAGNOSIS — R1312 Dysphagia, oropharyngeal phase: Secondary | ICD-10-CM | POA: Diagnosis not present

## 2021-04-24 DIAGNOSIS — R5381 Other malaise: Secondary | ICD-10-CM | POA: Diagnosis not present

## 2021-04-24 DIAGNOSIS — F419 Anxiety disorder, unspecified: Secondary | ICD-10-CM | POA: Diagnosis not present

## 2021-04-24 DIAGNOSIS — R4689 Other symptoms and signs involving appearance and behavior: Secondary | ICD-10-CM | POA: Diagnosis not present

## 2021-04-24 DIAGNOSIS — R1312 Dysphagia, oropharyngeal phase: Secondary | ICD-10-CM | POA: Diagnosis not present

## 2021-04-24 DIAGNOSIS — F0281 Dementia in other diseases classified elsewhere with behavioral disturbance: Secondary | ICD-10-CM | POA: Diagnosis not present

## 2021-04-24 DIAGNOSIS — I10 Essential (primary) hypertension: Secondary | ICD-10-CM | POA: Diagnosis not present

## 2021-04-24 DIAGNOSIS — E039 Hypothyroidism, unspecified: Secondary | ICD-10-CM | POA: Diagnosis not present

## 2021-04-24 DIAGNOSIS — J441 Chronic obstructive pulmonary disease with (acute) exacerbation: Secondary | ICD-10-CM | POA: Diagnosis not present

## 2021-04-24 DIAGNOSIS — M6281 Muscle weakness (generalized): Secondary | ICD-10-CM | POA: Diagnosis not present

## 2021-04-25 DIAGNOSIS — F0281 Dementia in other diseases classified elsewhere with behavioral disturbance: Secondary | ICD-10-CM | POA: Diagnosis not present

## 2021-04-25 DIAGNOSIS — R1312 Dysphagia, oropharyngeal phase: Secondary | ICD-10-CM | POA: Diagnosis not present

## 2021-04-25 DIAGNOSIS — M6281 Muscle weakness (generalized): Secondary | ICD-10-CM | POA: Diagnosis not present

## 2021-04-27 DIAGNOSIS — M6281 Muscle weakness (generalized): Secondary | ICD-10-CM | POA: Diagnosis not present

## 2021-04-27 DIAGNOSIS — F0281 Dementia in other diseases classified elsewhere with behavioral disturbance: Secondary | ICD-10-CM | POA: Diagnosis not present

## 2021-04-27 DIAGNOSIS — R1312 Dysphagia, oropharyngeal phase: Secondary | ICD-10-CM | POA: Diagnosis not present

## 2021-04-28 DIAGNOSIS — N390 Urinary tract infection, site not specified: Secondary | ICD-10-CM | POA: Diagnosis not present

## 2021-04-28 DIAGNOSIS — R1312 Dysphagia, oropharyngeal phase: Secondary | ICD-10-CM | POA: Diagnosis not present

## 2021-04-28 DIAGNOSIS — M6281 Muscle weakness (generalized): Secondary | ICD-10-CM | POA: Diagnosis not present

## 2021-04-28 DIAGNOSIS — F0281 Dementia in other diseases classified elsewhere with behavioral disturbance: Secondary | ICD-10-CM | POA: Diagnosis not present

## 2021-04-29 DIAGNOSIS — R1312 Dysphagia, oropharyngeal phase: Secondary | ICD-10-CM | POA: Diagnosis not present

## 2021-04-29 DIAGNOSIS — M6281 Muscle weakness (generalized): Secondary | ICD-10-CM | POA: Diagnosis not present

## 2021-04-29 DIAGNOSIS — F0281 Dementia in other diseases classified elsewhere with behavioral disturbance: Secondary | ICD-10-CM | POA: Diagnosis not present

## 2021-04-29 NOTE — Progress Notes (Signed)
[facility] [place]  Telephone:(336) [phone]  Fax:(336) [phone]     Ariana [name] DOB: 25-Apr-1933  MR#: [id number]  SKA#:[id number]  Patient Care Team: Caprice Renshaw, MD as PCP - General (Internal Medicine) Lloyd Huger, MD as Consulting Physician (Oncology)   CHIEF COMPLAINT: Chronic refractory ITP  INTERVAL HISTORY: Patient returns to clinic today for repeat laboratory work, further evaluation, and continuation of Nplate.  She continues to have a poor memory secondary to dementia, but appears to be at her baseline today.  She continues to have chronic weakness and fatigue. She denies any easy bleeding or bruising.  She has no neurologic complaints.  She denies any recent fevers or illnesses. She denies any chest pain, shortness of breath, cough, or hemoptysis.  She denies any nausea, vomiting, constipation, or diarrhea.  She has no melena or hematochezia.  She has no urinary complaints.  Patient appears to be at her baseline and offers no specific complaints today.  REVIEW OF SYSTEMS:   Review of Systems  Constitutional:  Positive for malaise/fatigue. Negative for fever and weight loss.  HENT:  Negative for congestion.   Respiratory: Negative.  Negative for cough, hemoptysis and shortness of breath.   Cardiovascular: Negative.  Negative for chest pain and leg swelling.  Gastrointestinal: Negative.  Negative for abdominal pain, blood in stool, diarrhea, melena and nausea.  Genitourinary: Negative.  Negative for dysuria and hematuria.  Musculoskeletal: Negative.  Negative for back pain, falls and joint pain.  Skin: Negative.  Negative for rash.  Neurological:  Positive for weakness. Negative for dizziness, sensory change, focal weakness and headaches.  Endo/Heme/Allergies:  Does not bruise/bleed easily.  Psychiatric/Behavioral:  Positive for memory loss. Negative for depression. The patient is not nervous/anxious.    As per HPI. Otherwise, a complete review of systems  is negative.  PAST MEDICAL HISTORY: Past Medical History:  Diagnosis Date   Alzheimer disease ([place])    Anemia    Anxiety    Asthma    WELL CONTROLLED   Chronic back pain    Cognitive communication deficit    COPD (chronic obstructive pulmonary disease) (HCC)    Depression    Depression    GERD (gastroesophageal reflux disease)    Hiatal hernia    Hypercholesteremia    Hypothyroidism    Iron deficiency    ITP (idiopathic thrombocytopenic purpura)    FOLLOWED BR DR Grayland Ormond   LBBB (left bundle branch block)    Osteoarthritis    Osteoporosis    Restless legs     PAST SURGICAL HISTORY: Past Surgical History:  Procedure Laterality Date   CARPAL TUNNEL RELEASE     ESOPHAGOGASTRODUODENOSCOPY (EGD) WITH PROPOFOL N/A 09/10/2015   Procedure: ESOPHAGOGASTRODUODENOSCOPY (EGD) WITH PROPOFOL;  Surgeon: Lollie Sails, MD;  Location: [facility] ENDOSCOPY;  Service: Endoscopy;  Laterality: N/A;   HIP PINNING,CANNULATED Left 10/31/2020   Procedure: CANNULATED HIP PINNING;  Surgeon: Leim Fabry, MD;  Location: [facility] ORS;  Service: Orthopedics;  Laterality: Left;   IRRIGATION AND DEBRIDEMENT HEMATOMA Right 07/13/2018   Procedure: IRRIGATION AND DEBRIDEMENT HEMATOMA-RIGHT SHIN;  Surgeon: Herbert Pun, MD;  Location: [facility] ORS;  Service: General;  Laterality: Right;   KYPHOPLASTY N/A 05/02/2019   Procedure: L1 KYPHOPLASTY;  Surgeon: Hessie Knows, MD;  Location: [facility] ORS;  Service: Orthopedics;  Laterality: N/A;   LUMBAR DISC SURGERY     REVERSE SHOULDER ARTHROPLASTY Left 06/25/2020   Procedure: REVERSE SHOULDER ARTHROPLASTY;  Surgeon: Corky Mull, MD;  Location: [facility] ORS;  Service: Orthopedics;  Laterality: Left;   SPLENECTOMY, PARTIAL     TOTAL ABDOMINAL HYSTERECTOMY      FAMILY HISTORY Family History  Problem Relation Age of Onset   Stroke Mother     GYNECOLOGIC HISTORY:  No LMP recorded. Patient has had a hysterectomy.     ADVANCED DIRECTIVES:    HEALTH  MAINTENANCE: Social History   Tobacco Use   Smoking status: Never   Smokeless tobacco: Never  Vaping Use   Vaping Use: Never used  Substance Use Topics   Alcohol use: No    Alcohol/week: 0.0 standard drinks   Drug use: No     Allergies  Allergen Reactions   Aspirin     Upset stomach   Diazepam Itching    sneezing   Other     seasonal allergies   Tetanus Toxoids     Localized superficial swelling of skin   Morphine Itching and Rash    Current Outpatient Medications  Medication Sig Dispense Refill   acetaminophen (TYLENOL) 325 MG tablet Take 2 tablets (650 mg total) by mouth every 6 (six) hours as needed for mild pain (or Fever >/= 101). 100 tablet 0   albuterol (PROVENTIL HFA;VENTOLIN HFA) 108 (90 BASE) MCG/ACT inhaler Inhale 2 puffs into the lungs every 6 (six) hours as needed for wheezing or shortness of breath.     ALPRAZolam (XANAX) 0.5 MG tablet Take 1 tablet (0.5 mg total) by mouth 3 (three) times daily as needed for anxiety. 15 tablet 0   amLODipine (NORVASC) 10 MG tablet Take 1 tablet (10 mg total) by mouth daily.     busPIRone (BUSPAR) 7.5 MG tablet Take 1 tablet (7.5 mg total) by mouth 2 (two) times daily.     cholecalciferol (VITAMIN D3) 25 MCG (1000 UNIT) tablet Take 2,000 Units by mouth daily.     citalopram (CELEXA) 20 MG tablet Take 1 tablet (20 mg total) by mouth every morning.     Cyanocobalamin 1000 MCG TBCR Take 1,000 mcg by mouth daily.      fluticasone (FLONASE) 50 MCG/ACT nasal spray Place 2 sprays into both nostrils daily as needed for allergies.     Fluticasone-Salmeterol (ADVAIR) 250-50 MCG/DOSE AEPB Inhale 1 puff into the lungs 2 (two) times daily as needed (shortness of breath).      gabapentin (NEURONTIN) 100 MG capsule Take 2 capsules (200 mg total) by mouth 4 (four) times daily.     iron polysaccharides (NIFEREX) 150 MG capsule Take 1 capsule (150 mg total) by mouth daily.     lamoTRIgine (LAMICTAL) 25 MG tablet Take 50 mg by mouth at bedtime.      levothyroxine (SYNTHROID, LEVOTHROID) 50 MCG tablet Take 50 mcg by mouth daily before breakfast. Take 30 to 60 minutes before breakfast.     loratadine (CLARITIN) 10 MG tablet Take 1 tablet (10 mg total) by mouth daily.     memantine (NAMENDA) 10 MG tablet Take 10 mg by mouth 2 (two) times daily.     montelukast (SINGULAIR) 10 MG tablet Take 10 mg by mouth at bedtime.     Multiple Vitamin (MULTI-VITAMIN) tablet Take 1 tablet by mouth daily.     Multiple Vitamins-Minerals (PRESERVISION AREDS 2) CAPS Take 1 capsule by mouth 2 (two) times a day.     ondansetron (ZOFRAN) 4 MG tablet Take 1 tablet (4 mg total) by mouth every 6 (six) hours as needed for nausea. 20 tablet 0   oxybutynin (DITROPAN) 5 MG tablet Take 5 mg by mouth  3 (three) times daily.     pantoprazole (PROTONIX) 20 MG tablet Take 20 mg by mouth 2 (two) times daily.      QUEtiapine (SEROQUEL) 25 MG tablet Take 0.5 tablets (12.5 mg total) by mouth every 6 (six) hours as needed (agitation).     rosuvastatin (CRESTOR) 5 MG tablet Take 5 mg by mouth daily.     vitamin E 180 MG (400 UNITS) capsule Take 400 Units by mouth daily.     No current facility-administered medications for this visit.    OBJECTIVE: BP (!) 139/55 (Patient Position: Sitting)   Pulse (!) 58   Temp 97.8 F (36.6 C) (Tympanic)   Resp 16   Wt 126 lb (57.2 kg)   SpO2 100%   BMI 23.05 kg/m    Body mass index is 23.05 kg/m.    ECOG FS:2 - Symptomatic, <50% confined to bed  General: Well-developed, well-nourished, no acute distress. Eyes: Pink conjunctiva, anicteric sclera. HEENT: Normocephalic, moist mucous membranes. Lungs: No audible wheezing or coughing. Heart: Regular rate and rhythm. Abdomen: Soft, nontender, no obvious distention. Musculoskeletal: No edema, cyanosis, or clubbing. Neuro: Alert, answering all questions appropriately. Cranial nerves grossly intact. Skin: No rashes or petechiae noted. Psych: Normal affect.   LAB RESULTS:  Appointment  on 04/30/2021  Component Date Value Ref Range Status   WBC 04/30/2021 8.2  4.0 - 10.5 K/uL Final   RBC 04/30/2021 4.80  3.87 - 5.11 MIL/uL Final   Hemoglobin 04/30/2021 14.1  12.0 - 15.0 g/dL Final   HCT 04/30/2021 42.7  36.0 - 46.0 % Final   MCV 04/30/2021 89.0  80.0 - 100.0 fL Final   MCH 04/30/2021 29.4  26.0 - 34.0 pg Final   MCHC 04/30/2021 33.0  30.0 - 36.0 g/dL Final   RDW 04/30/2021 17.2 (A) 11.5 - 15.5 % Final   Platelets 04/30/2021 13 (A) 150 - 400 K/uL Final   Comment: Immature Platelet Fraction may be clinically indicated, consider ordering this additional test [id number] THIS CRITICAL RESULT HAS VERIFIED AND BEEN CALLED TO [facility],S BY CHASE ISLEY ON 07 20 2022 AT 1043, AND HAS BEEN READ BACK.     nRBC 04/30/2021 0.0  0.0 - 0.2 % Final   Neutrophils Relative % 04/30/2021 56  % Final   Neutro Abs 04/30/2021 4.6  1.7 - 7.7 K/uL Final   Lymphocytes Relative 04/30/2021 32  % Final   Lymphs Abs 04/30/2021 2.7  0.7 - 4.0 K/uL Final   Monocytes Relative 04/30/2021 10  % Final   Monocytes Absolute 04/30/2021 0.8  0.1 - 1.0 K/uL Final   Eosinophils Relative 04/30/2021 1  % Final   Eosinophils Absolute 04/30/2021 0.1  0.0 - 0.5 K/uL Final   Basophils Relative 04/30/2021 1  % Final   Basophils Absolute 04/30/2021 0.1  0.0 - 0.1 K/uL Final   RBC Morphology 04/30/2021 MORPHOLOGY UNREMARKABLE   Final   Smear Review 04/30/2021 PLATELETS APPEAR DECREASED   Final   Comment: PLATELET COUNT CONFIRMED BY SMEAR LARGE PLTS SEEN    Immature Granulocytes 04/30/2021 0  % Final   Abs Immature Granulocytes 04/30/2021 0.02  0.00 - 0.07 K/uL Final   Performed at [facility], [address], [place], [place] [address]    STUDIES: DG Chest 2 View  Result Date: 05/04/2021 CLINICAL DATA:  Chest pain EXAM: CHEST - 2 VIEW COMPARISON:  01/07/2021 chest radiograph. FINDINGS: Partially visualized left total shoulder arthroplasty. Stable cardiomediastinal silhouette with normal heart size. No  pneumothorax. No  pleural effusion. Mildly hyperinflated lungs. No pulmonary edema. No acute consolidative airspace disease. Vertebroplasty material again noted within an upper lumbar vertebral body. IMPRESSION: Hyperinflated lungs may indicate obstructive lung disease. Otherwise no acute cardiopulmonary disease. Electronically Signed   By: Ilona Sorrel M.D.   On: 05/04/2021 12:59    ASSESSMENT:  Chronic refractory ITP  PLAN:   1. Chronic refractory ITP: Patient had a poor response to Prednisone, WinRho, and Rituxan. She is status post splenectomy in 2007.  She received IVIG and Nplate while in the hospital with significant improvement of her platelet count.   If Nplate no longer was effective, could retry IVIG at a future date.  Patient is currently receiving the max dose of Nplate at 10 mcg/kg.  Can also consider Promacta 12.5mg  or Tavalisse 100 mg BID if needed.  Patient's platelet count is 13 today.  Proceed with Nplate as ordered.  Return to clinic in 4 weeks for further evaluation and continuation of treatment.  2. Weakness and fatigue: Chronic and unchanged. 3.  Dementia: Chronic and unchanged.  Patient is now on a full-time memory unit. 4.  Fractured hip: Essentially resolved.  Continue exercises provided by physical therapy.    Patient expressed understanding and was in agreement with this plan. She also understands that She can call clinic at any time with any questions, concerns, or complaints.    Lloyd Huger, MD   05/05/2021 5:54 AM

## 2021-04-30 ENCOUNTER — Encounter: Payer: Self-pay | Admitting: Oncology

## 2021-04-30 ENCOUNTER — Other Ambulatory Visit: Payer: Self-pay

## 2021-04-30 ENCOUNTER — Inpatient Hospital Stay: Payer: Medicare HMO | Attending: Oncology

## 2021-04-30 ENCOUNTER — Inpatient Hospital Stay (HOSPITAL_BASED_OUTPATIENT_CLINIC_OR_DEPARTMENT_OTHER): Payer: Medicare HMO | Admitting: Oncology

## 2021-04-30 ENCOUNTER — Inpatient Hospital Stay: Payer: Medicare HMO

## 2021-04-30 VITALS — BP 139/55 | HR 58 | Temp 97.8°F | Resp 16 | Wt 126.0 lb

## 2021-04-30 DIAGNOSIS — D693 Immune thrombocytopenic purpura: Secondary | ICD-10-CM

## 2021-04-30 DIAGNOSIS — F0281 Dementia in other diseases classified elsewhere with behavioral disturbance: Secondary | ICD-10-CM | POA: Diagnosis not present

## 2021-04-30 DIAGNOSIS — M6281 Muscle weakness (generalized): Secondary | ICD-10-CM | POA: Diagnosis not present

## 2021-04-30 DIAGNOSIS — R1312 Dysphagia, oropharyngeal phase: Secondary | ICD-10-CM | POA: Diagnosis not present

## 2021-04-30 LAB — CBC WITH DIFFERENTIAL/PLATELET
Abs Immature Granulocytes: 0.02 10*3/uL (ref 0.00–0.07)
Basophils Absolute: 0.1 10*3/uL (ref 0.0–0.1)
Basophils Relative: 1 %
Eosinophils Absolute: 0.1 10*3/uL (ref 0.0–0.5)
Eosinophils Relative: 1 %
HCT: 42.7 % (ref 36.0–46.0)
Hemoglobin: 14.1 g/dL (ref 12.0–15.0)
Immature Granulocytes: 0 %
Lymphocytes Relative: 32 %
Lymphs Abs: 2.7 10*3/uL (ref 0.7–4.0)
MCH: 29.4 pg (ref 26.0–34.0)
MCHC: 33 g/dL (ref 30.0–36.0)
MCV: 89 fL (ref 80.0–100.0)
Monocytes Absolute: 0.8 10*3/uL (ref 0.1–1.0)
Monocytes Relative: 10 %
Neutro Abs: 4.6 10*3/uL (ref 1.7–7.7)
Neutrophils Relative %: 56 %
Platelets: 13 10*3/uL — CL (ref 150–400)
RBC: 4.8 MIL/uL (ref 3.87–5.11)
RDW: 17.2 % — ABNORMAL HIGH (ref 11.5–15.5)
Smear Review: DECREASED
WBC: 8.2 10*3/uL (ref 4.0–10.5)
nRBC: 0 % (ref 0.0–0.2)

## 2021-04-30 MED ORDER — ROMIPLOSTIM INJECTION 500 MCG
10.0000 ug/kg | Freq: Once | SUBCUTANEOUS | Status: AC
Start: 2021-04-30 — End: 2021-04-30
  Administered 2021-04-30: 570 ug via SUBCUTANEOUS
  Filled 2021-04-30: qty 1.14

## 2021-04-30 NOTE — Progress Notes (Signed)
Patient here for oncology follow-up appointment,  concerns of constipation and weakness

## 2021-05-01 DIAGNOSIS — F0281 Dementia in other diseases classified elsewhere with behavioral disturbance: Secondary | ICD-10-CM | POA: Diagnosis not present

## 2021-05-01 DIAGNOSIS — R1312 Dysphagia, oropharyngeal phase: Secondary | ICD-10-CM | POA: Diagnosis not present

## 2021-05-01 DIAGNOSIS — E039 Hypothyroidism, unspecified: Secondary | ICD-10-CM | POA: Diagnosis not present

## 2021-05-01 DIAGNOSIS — N390 Urinary tract infection, site not specified: Secondary | ICD-10-CM | POA: Diagnosis not present

## 2021-05-01 DIAGNOSIS — K219 Gastro-esophageal reflux disease without esophagitis: Secondary | ICD-10-CM | POA: Diagnosis not present

## 2021-05-01 DIAGNOSIS — M6281 Muscle weakness (generalized): Secondary | ICD-10-CM | POA: Diagnosis not present

## 2021-05-01 DIAGNOSIS — R5381 Other malaise: Secondary | ICD-10-CM | POA: Diagnosis not present

## 2021-05-01 DIAGNOSIS — R319 Hematuria, unspecified: Secondary | ICD-10-CM | POA: Diagnosis not present

## 2021-05-01 DIAGNOSIS — J441 Chronic obstructive pulmonary disease with (acute) exacerbation: Secondary | ICD-10-CM | POA: Diagnosis not present

## 2021-05-01 DIAGNOSIS — G629 Polyneuropathy, unspecified: Secondary | ICD-10-CM | POA: Diagnosis not present

## 2021-05-02 DIAGNOSIS — F0281 Dementia in other diseases classified elsewhere with behavioral disturbance: Secondary | ICD-10-CM | POA: Diagnosis not present

## 2021-05-02 DIAGNOSIS — M6281 Muscle weakness (generalized): Secondary | ICD-10-CM | POA: Diagnosis not present

## 2021-05-02 DIAGNOSIS — R1312 Dysphagia, oropharyngeal phase: Secondary | ICD-10-CM | POA: Diagnosis not present

## 2021-05-04 ENCOUNTER — Encounter (HOSPITAL_COMMUNITY): Payer: Self-pay

## 2021-05-04 ENCOUNTER — Other Ambulatory Visit: Payer: Self-pay

## 2021-05-04 ENCOUNTER — Emergency Department (HOSPITAL_COMMUNITY)
Admission: EM | Admit: 2021-05-04 | Discharge: 2021-05-04 | Disposition: A | Discharge status: Other Acute Inpatient Hospital | Payer: Medicare HMO | Attending: Emergency Medicine | Admitting: Emergency Medicine

## 2021-05-04 ENCOUNTER — Emergency Department (HOSPITAL_COMMUNITY): Payer: Medicare HMO

## 2021-05-04 DIAGNOSIS — J45909 Unspecified asthma, uncomplicated: Secondary | ICD-10-CM | POA: Insufficient documentation

## 2021-05-04 DIAGNOSIS — K219 Gastro-esophageal reflux disease without esophagitis: Secondary | ICD-10-CM | POA: Diagnosis not present

## 2021-05-04 DIAGNOSIS — Z7401 Bed confinement status: Secondary | ICD-10-CM | POA: Diagnosis not present

## 2021-05-04 DIAGNOSIS — Z96612 Presence of left artificial shoulder joint: Secondary | ICD-10-CM | POA: Insufficient documentation

## 2021-05-04 DIAGNOSIS — G309 Alzheimer's disease, unspecified: Secondary | ICD-10-CM | POA: Diagnosis not present

## 2021-05-04 DIAGNOSIS — J449 Chronic obstructive pulmonary disease, unspecified: Secondary | ICD-10-CM | POA: Insufficient documentation

## 2021-05-04 DIAGNOSIS — Z79899 Other long term (current) drug therapy: Secondary | ICD-10-CM | POA: Insufficient documentation

## 2021-05-04 DIAGNOSIS — R079 Chest pain, unspecified: Secondary | ICD-10-CM | POA: Diagnosis not present

## 2021-05-04 DIAGNOSIS — R0789 Other chest pain: Secondary | ICD-10-CM | POA: Diagnosis not present

## 2021-05-04 DIAGNOSIS — R531 Weakness: Secondary | ICD-10-CM | POA: Diagnosis not present

## 2021-05-04 DIAGNOSIS — E039 Hypothyroidism, unspecified: Secondary | ICD-10-CM | POA: Insufficient documentation

## 2021-05-04 DIAGNOSIS — R001 Bradycardia, unspecified: Secondary | ICD-10-CM | POA: Diagnosis not present

## 2021-05-04 LAB — CBC
HCT: 41.6 % (ref 36.0–46.0)
Hemoglobin: 13.6 g/dL (ref 12.0–15.0)
MCH: 29.7 pg (ref 26.0–34.0)
MCHC: 32.7 g/dL (ref 30.0–36.0)
MCV: 90.8 fL (ref 80.0–100.0)
Platelets: 17 10*3/uL — CL (ref 150–400)
RBC: 4.58 MIL/uL (ref 3.87–5.11)
RDW: 17.7 % — ABNORMAL HIGH (ref 11.5–15.5)
WBC: 11 10*3/uL — ABNORMAL HIGH (ref 4.0–10.5)
nRBC: 0 % (ref 0.0–0.2)

## 2021-05-04 LAB — BASIC METABOLIC PANEL
Anion gap: 7 (ref 5–15)
BUN: 22 mg/dL (ref 8–23)
CO2: 28 mmol/L (ref 22–32)
Calcium: 9.4 mg/dL (ref 8.9–10.3)
Chloride: 106 mmol/L (ref 98–111)
Creatinine, Ser: 0.89 mg/dL (ref 0.44–1.00)
GFR, Estimated: >60 mL/min (ref >60)
Glucose, Bld: 78 mg/dL (ref 70–99)
Potassium: 4.2 mmol/L (ref 3.5–5.1)
Sodium: 141 mmol/L (ref 135–145)

## 2021-05-04 LAB — TROPONIN I (HIGH SENSITIVITY)
Troponin I (High Sensitivity): 6 ng/L (ref <18)
Troponin I (High Sensitivity): 6 ng/L (ref <18)

## 2021-05-04 MED ORDER — LIDOCAINE VISCOUS HCL 2 % MT SOLN: 15.0000 mL | Freq: Once | OROMUCOSAL | Status: DC

## 2021-05-04 MED ORDER — ALUM & MAG HYDROXIDE-SIMETH 200-200-20 MG/5ML PO SUSP: 30.0000 mL | Freq: Once | ORAL | Status: DC

## 2021-05-04 NOTE — ED Provider Notes (Signed)
[facility] EMERGENCY DEPARTMENT Provider Note   CSN: [id number] Arrival date & time: 05/04/21  1156     History Chief Complaint  Patient presents with  . Chest Pain    Ariana [name] is a 87 y.o. female.  HPI  Patient with significant medical history of Alzheimer's disease, anxiety, COPD, hiatal hernia, chronic ITP with a platelets in the teens presents to the emergency department via EMS from [place].  She is here because she is having chest pain, states it happened around 830 this morning, states she was sitting and the pain came on, states the pain was intermittent, did not radiate, not associated with nausea, vomiting, becoming diaphoretic, states the pain has gone away on its own.  She currently has no complaints at this time.  She has no cardiac history, no history of PEs or DVTs, patient denies worsening leg swelling.  Patient does not endorse fevers, chills, chest pain, shortness of breath, abdominal pain worsening pedal edema.  Spoke with nursing staff from [place], she validated the story, she endorses that patient was taking a bath today, became panicked while taking bath on her own, she endorsed that she had some chest pain described it as pressure-like sensation, she gave her some Ativan this improved her pain.  She has no other complaints.  Past Medical History:  Diagnosis Date  . Alzheimer disease ([place])   . Anemia   . Anxiety   . Asthma    WELL CONTROLLED  . Chronic back pain   . Cognitive communication deficit   . COPD (chronic obstructive pulmonary disease) ([place])   . Depression   . Depression   . GERD (gastroesophageal reflux disease)   . Hiatal hernia   . Hypercholesteremia   . Hypothyroidism   . Iron deficiency   . ITP (idiopathic thrombocytopenic purpura)    FOLLOWED BR DR Grayland Ormond  . LBBB (left bundle branch block)   . Osteoarthritis   . Osteoporosis   . Restless legs     Patient Active Problem List   Diagnosis Date Noted  .  Hiatal hernia 04/09/2021  . GERD (gastroesophageal reflux disease) 04/09/2021  . Normocytic anemia   . Skin tear of right hand without complication   . Fall   . Malnutrition of moderate degree 01/09/2021  . Palliative care encounter   . Closed displaced fracture of left femoral neck ([place]) 10/30/2020  . Rectal bleeding 08/18/2020  . Leukocytosis 08/18/2020  . Status post reverse total shoulder replacement, left 06/25/2020  . Rotator cuff arthropathy, left 06/24/2020  . Failure to thrive in adult 02/16/2020  . Thrombocytopenia ([place]) 02/15/2020  . Pneumonia 07/10/2019  . Laceration of left lower leg 04/10/2019  . Chronic pain disorder 01/11/2019  . OSA (obstructive sleep apnea) 09/28/2018  . Ulcer of lower limb, right, limited to breakdown of skin ([place] ) 07/22/2018  . Hematoma of lower limb, right, initial encounter 07/18/2018  . Traumatic hematoma of right lower leg 07/12/2018  . Asthma without status asthmaticus 11/25/2016  . Late onset Alzheimer's disease without behavioral disturbance ([place]) 12/30/2015  . Back muscle spasm 09/09/2015  . Clinical depression 04/12/2015  . Cellulitis 04/12/2015  . Acute ITP ([place]) 03/15/2015  . Chest pain 10/22/2014  . Breathlessness on exertion 10/22/2014  . Osteopenia 10/18/2014  . Benign essential hypertension 06/28/2014  . HLD (hyperlipidemia) 06/28/2014  . Idiopathic thrombocythemia ([place]) 06/28/2014  . Acquired hypothyroidism 06/04/2014  . Depressive disorder 06/04/2014  . Bursitis, trochanteric 03/27/2014  . DDD (degenerative disc disease), lumbar  02/27/2014  . Neuritis or radiculitis due to rupture of lumbar intervertebral disc 02/27/2014  . Lumbar radiculitis 02/27/2014    Past Surgical History:  Procedure Laterality Date  . CARPAL TUNNEL RELEASE    . ESOPHAGOGASTRODUODENOSCOPY (EGD) WITH PROPOFOL N/A 09/10/2015   Procedure: ESOPHAGOGASTRODUODENOSCOPY (EGD) WITH PROPOFOL;  Surgeon: Lollie Sails, MD;  Location: [facility] ENDOSCOPY;   Service: Endoscopy;  Laterality: N/A;  . HIP PINNING,CANNULATED Left 10/31/2020   Procedure: CANNULATED HIP PINNING;  Surgeon: Leim Fabry, MD;  Location: [facility] ORS;  Service: Orthopedics;  Laterality: Left;  . IRRIGATION AND DEBRIDEMENT HEMATOMA Right 07/13/2018   Procedure: IRRIGATION AND DEBRIDEMENT HEMATOMA-RIGHT SHIN;  Surgeon: Herbert Pun, MD;  Location: [facility] ORS;  Service: General;  Laterality: Right;  . KYPHOPLASTY N/A 05/02/2019   Procedure: L1 KYPHOPLASTY;  Surgeon: Hessie Knows, MD;  Location: [facility] ORS;  Service: Orthopedics;  Laterality: N/A;  . LUMBAR [place]    . REVERSE SHOULDER ARTHROPLASTY Left 06/25/2020   Procedure: REVERSE SHOULDER ARTHROPLASTY;  Surgeon: Corky Mull, MD;  Location: [facility] ORS;  Service: Orthopedics;  Laterality: Left;  . SPLENECTOMY, PARTIAL    . TOTAL ABDOMINAL HYSTERECTOMY       OB History   No obstetric history on file.     Family History  Problem Relation Age of Onset  . Stroke Mother     Social History   Tobacco Use  . Smoking status: Never  . Smokeless tobacco: Never  Vaping Use  . Vaping Use: Never used  Substance Use Topics  . Alcohol use: No    Alcohol/week: 0.0 standard drinks  . Drug use: No    Home Medications Prior to Admission medications   Medication Sig Start Date End Date Taking? Authorizing Provider  acetaminophen (TYLENOL) 325 MG tablet Take 2 tablets (650 mg total) by mouth every 6 (six) hours as needed for mild pain (or Fever >/= 101). 07/02/15   Idelle Crouch, MD  albuterol (PROVENTIL HFA;VENTOLIN HFA) 108 (90 BASE) MCG/ACT inhaler Inhale 2 puffs into the lungs every 6 (six) hours as needed for wheezing or shortness of breath.    [provider]  ALPRAZolam Duanne Moron) 0.5 MG tablet Take 1 tablet (0.5 mg total) by mouth 3 (three) times daily as needed for anxiety. 02/06/21   Johnson, Clanford L, MD  amLODipine (NORVASC) 10 MG tablet Take 1 tablet (10 mg total) by mouth daily. 01/17/21   Ezekiel Slocumb, DO  busPIRone (BUSPAR) 7.5 MG tablet Take 1 tablet (7.5 mg total) by mouth 2 (two) times daily. 01/17/21   Ezekiel Slocumb, DO  cholecalciferol (VITAMIN D3) 25 MCG (1000 UNIT) tablet Take 2,000 Units by mouth daily.    [provider]  citalopram (CELEXA) 20 MG tablet Take 1 tablet (20 mg total) by mouth every morning. 02/06/21   Johnson, Clanford L, MD  Cyanocobalamin 1000 MCG TBCR Take 1,000 mcg by mouth daily.     [provider]  fluticasone (FLONASE) 50 MCG/ACT nasal spray Place 2 sprays into both nostrils daily as needed for allergies. 03/03/17   [provider]  Fluticasone-Salmeterol (ADVAIR) 250-50 MCG/DOSE AEPB Inhale 1 puff into the lungs 2 (two) times daily as needed (shortness of breath).     [provider]  gabapentin (NEURONTIN) 100 MG capsule Take 2 capsules (200 mg total) by mouth 4 (four) times daily. 02/06/21   Johnson, Clanford L, MD  iron polysaccharides (NIFEREX) 150 MG capsule Take 1 capsule (150 mg total) by mouth daily. 01/17/21  Nicole Kindred A, DO  lamoTRIgine (LAMICTAL) 25 MG tablet Take 50 mg by mouth at bedtime. 08/05/20   [provider]  levothyroxine (SYNTHROID, LEVOTHROID) 50 MCG tablet Take 50 mcg by mouth daily before breakfast. Take 30 to 60 minutes before breakfast.    [provider]  loratadine (CLARITIN) 10 MG tablet Take 1 tablet (10 mg total) by mouth daily. 01/17/21   Ezekiel Slocumb, DO  memantine (NAMENDA) 10 MG tablet Take 10 mg by mouth 2 (two) times daily.    [provider]  montelukast (SINGULAIR) 10 MG tablet Take 10 mg by mouth at bedtime.    [provider]  Multiple Vitamin (MULTI-VITAMIN) tablet Take 1 tablet by mouth daily.    [provider]  Multiple Vitamins-Minerals (PRESERVISION AREDS 2) CAPS Take 1 capsule by mouth 2 (two) times a day.    [provider]  ondansetron (ZOFRAN) 4 MG tablet Take 1 tablet (4 mg total) by mouth every 6 (six) hours  as needed for nausea. 01/17/21   Ezekiel Slocumb, DO  oxybutynin (DITROPAN) 5 MG tablet Take 5 mg by mouth 3 (three) times daily.    [provider]  pantoprazole (PROTONIX) 20 MG tablet Take 20 mg by mouth 2 (two) times daily.  07/24/15   [provider]  QUEtiapine (SEROQUEL) 25 MG tablet Take 0.5 tablets (12.5 mg total) by mouth every 6 (six) hours as needed (agitation). 01/17/21   Nicole Kindred A, DO  rosuvastatin (CRESTOR) 5 MG tablet Take 5 mg by mouth daily.    [provider]  vitamin E 180 MG (400 UNITS) capsule Take 400 Units by mouth daily.    [provider]    Allergies    Aspirin, Diazepam, Other, Tetanus toxoids, and Morphine  Review of Systems   Review of Systems  Constitutional:  Negative for chills and fever.  HENT:  Negative for congestion.   Respiratory:  Negative for shortness of breath.   Cardiovascular:  Positive for chest pain. Negative for leg swelling.  Gastrointestinal:  Negative for abdominal pain, nausea and vomiting.  Genitourinary:  Negative for enuresis.  Musculoskeletal:  Negative for back pain.  Skin:  Negative for rash.  Neurological:  Negative for dizziness.  Hematological:  Does not bruise/bleed easily.   Physical Exam Updated Vital Signs BP (!) 114/55   Pulse (!) 58   Temp 98.8 F (37.1 C) (Oral)   Resp 16   Ht '5\' 5"'$  (1.651 m)   Wt 58.5 kg   SpO2 99%   BMI 21.47 kg/m   Physical Exam Vitals and nursing note reviewed.  Constitutional:      General: She is not in acute distress.    Appearance: She is not ill-appearing.  HENT:     Head: Normocephalic and atraumatic.     Nose: No congestion.  Eyes:     Conjunctiva/sclera: Conjunctivae normal.  Cardiovascular:     Rate and Rhythm: Normal rate and regular rhythm.     Pulses: Normal pulses.     Heart sounds: No murmur heard.   No friction rub. No gallop.  Pulmonary:     Effort: No respiratory distress.     Breath sounds: No wheezing, rhonchi or  rales.  Abdominal:     Palpations: Abdomen is soft.     Tenderness: There is no abdominal tenderness. There is no right CVA tenderness or left CVA tenderness.  Musculoskeletal:     Right lower leg: No edema.  Left lower leg: No edema.  Skin:    General: Skin is warm and dry.  Neurological:     Mental Status: She is alert.  Psychiatric:        Mood and Affect: Mood normal.    ED Results / Procedures / Treatments   Labs (all labs ordered are listed, but only abnormal results are displayed) Labs Reviewed  CBC - Abnormal; Notable for the following components:      Result Value   WBC 11.0 (*)    RDW 17.7 (*)    Platelets 17 (*)    All other components within normal limits  BASIC METABOLIC PANEL  TROPONIN I (HIGH SENSITIVITY)  TROPONIN I (HIGH SENSITIVITY)    EKG EKG Interpretation  Date/Time:  Sunday May 04 2021 12:02:57 EDT Ventricular Rate:  57 PR Interval:  199 QRS Duration: 97 QT Interval:  442 QTC Calculation: 431 R Axis:   -42 Text Interpretation: Duplicate Confirmed by Daleen Bo [phone]) on 05/04/2021 2:51:11 PM  Radiology DG Chest 2 View  Result Date: 05/04/2021 CLINICAL DATA:  Chest pain EXAM: CHEST - 2 VIEW COMPARISON:  01/07/2021 chest radiograph. FINDINGS: Partially visualized left total shoulder arthroplasty. Stable cardiomediastinal silhouette with normal heart size. No pneumothorax. No pleural effusion. Mildly hyperinflated lungs. No pulmonary edema. No acute consolidative airspace disease. Vertebroplasty material again noted within an upper lumbar vertebral body. IMPRESSION: Hyperinflated lungs may indicate obstructive lung disease. Otherwise no acute cardiopulmonary disease. Electronically Signed   By: Ilona Sorrel M.D.   On: 05/04/2021 12:59    Procedures Procedures   Medications Ordered in ED Medications - No data to display  ED Course  I have reviewed the triage vital signs and the nursing notes.  Pertinent labs & imaging results that were  available during my care of the patient were reviewed by me and considered in my medical decision making (see chart for details).    MDM Rules/Calculators/A&P                          Initial impression-patient presents with chest pain that has since resolved, she is alert, does not appear in acute stress, vital signs reassuring triage obtain chest pain work-up will continue to assess  Work-up-CBC shows leukocytosis of 11, platelets 17 appear to be a baseline for patient, CMP unremarkable, first troponin is 6, second troponin was 6 chest x-ray unremarkable  Reassessment-patient was reassessed, has no complaints this time, patient like to go home.  Rule out- I have low suspicion for ACS as history is atypical, patient has no cardiac history, EKG was sinus rhythm without signs of ischemia, patient had a delta troponin.  Low suspicion for PE as patient denies pleuritic chest pain, shortness of breath, patient denies leg pain, no pedal edema noted on exam, patient was PERC negative.  Low suspicion for AAA or aortic dissection as history is atypical, patient has low risk factors.  Low suspicion for systemic infection as patient is nontoxic-appearing, vital signs reassuring, no obvious source infection noted on exam.   Plan-  Chest pain since resolved-I suspect this is more a anxiety attack as patient has a history of anxiety, she started become panicked after taking a bath by herself, while symptoms improved after she had a Xanax.  We will have her follow-up with her PCP for further evaluation.  Vital signs have remained stable, no indication for hospital admission.  Patient discussed with attending and they agreed with assessment and  plan.  Patient given at home care as well strict return precautions.  Patient verbalized that they understood agreed to said plan.  Final Clinical Impression(s) / ED Diagnoses Final diagnoses:  Atypical chest pain    Rx / DC Orders ED Discharge Orders     None         Marcello Fennel, PA-C 05/04/21 1551    Daleen Bo, MD 05/05/21 [phone]

## 2021-05-04 NOTE — ED Triage Notes (Signed)
Pt brought to ED via [facility] for left sided chest pain started this morning after her morning bath. Pt was given ASA 81 mg and Xanax PTA. Pt states per pain felt better after she peed.

## 2021-05-04 NOTE — ED Provider Notes (Signed)
  Face-to-face evaluation   History: She presents for evaluation of chest pain.  She is unable to give any history.  Level 5 caveat-   Physical exam: No respiratory distress.  Extremities without swelling or tenderness.  No deformities of the extremities.  Medical screening examination/treatment/procedure(s) were conducted as a shared visit with non-physician practitioner(s) and myself.  I personally evaluated the patient during the encounter    Daleen Bo, MD 05/05/21 [phone]

## 2021-05-04 NOTE — ED Notes (Signed)
Called [facility] to attempt report with no answer.

## 2021-05-04 NOTE — ED Notes (Signed)
Date and time results received: 05/04/21 1:07 PM    Test: PLT  Critical Value: 17  Name of Provider Notified: Ileene Patrick PA

## 2021-05-04 NOTE — Discharge Instructions (Addendum)
Lab work and imaging are all reassuring please continue all home medication as prescribed.  Follow-up PCP as needed.  Come back to the emergency department if you develop chest pain, shortness of breath, severe abdominal pain, uncontrolled nausea, vomiting, diarrhea.

## 2021-05-05 ENCOUNTER — Encounter: Payer: Self-pay | Admitting: Oncology

## 2021-05-05 DIAGNOSIS — I10 Essential (primary) hypertension: Secondary | ICD-10-CM | POA: Diagnosis not present

## 2021-05-05 DIAGNOSIS — R52 Pain, unspecified: Secondary | ICD-10-CM | POA: Diagnosis not present

## 2021-05-05 DIAGNOSIS — J441 Chronic obstructive pulmonary disease with (acute) exacerbation: Secondary | ICD-10-CM | POA: Diagnosis not present

## 2021-05-05 DIAGNOSIS — F0281 Dementia in other diseases classified elsewhere with behavioral disturbance: Secondary | ICD-10-CM | POA: Diagnosis not present

## 2021-05-05 DIAGNOSIS — M6281 Muscle weakness (generalized): Secondary | ICD-10-CM | POA: Diagnosis not present

## 2021-05-05 DIAGNOSIS — K219 Gastro-esophageal reflux disease without esophagitis: Secondary | ICD-10-CM | POA: Diagnosis not present

## 2021-05-05 DIAGNOSIS — E039 Hypothyroidism, unspecified: Secondary | ICD-10-CM | POA: Diagnosis not present

## 2021-05-05 DIAGNOSIS — G629 Polyneuropathy, unspecified: Secondary | ICD-10-CM | POA: Diagnosis not present

## 2021-05-05 DIAGNOSIS — F419 Anxiety disorder, unspecified: Secondary | ICD-10-CM | POA: Diagnosis not present

## 2021-05-05 DIAGNOSIS — R1312 Dysphagia, oropharyngeal phase: Secondary | ICD-10-CM | POA: Diagnosis not present

## 2021-05-06 DIAGNOSIS — F0281 Dementia in other diseases classified elsewhere with behavioral disturbance: Secondary | ICD-10-CM | POA: Diagnosis not present

## 2021-05-06 DIAGNOSIS — R1312 Dysphagia, oropharyngeal phase: Secondary | ICD-10-CM | POA: Diagnosis not present

## 2021-05-06 DIAGNOSIS — M6281 Muscle weakness (generalized): Secondary | ICD-10-CM | POA: Diagnosis not present

## 2021-05-07 DIAGNOSIS — F0281 Dementia in other diseases classified elsewhere with behavioral disturbance: Secondary | ICD-10-CM | POA: Diagnosis not present

## 2021-05-07 DIAGNOSIS — M6281 Muscle weakness (generalized): Secondary | ICD-10-CM | POA: Diagnosis not present

## 2021-05-07 DIAGNOSIS — R1312 Dysphagia, oropharyngeal phase: Secondary | ICD-10-CM | POA: Diagnosis not present

## 2021-05-08 DIAGNOSIS — M6281 Muscle weakness (generalized): Secondary | ICD-10-CM | POA: Diagnosis not present

## 2021-05-08 DIAGNOSIS — F0281 Dementia in other diseases classified elsewhere with behavioral disturbance: Secondary | ICD-10-CM | POA: Diagnosis not present

## 2021-05-08 DIAGNOSIS — R1312 Dysphagia, oropharyngeal phase: Secondary | ICD-10-CM | POA: Diagnosis not present

## 2021-05-09 DIAGNOSIS — D509 Iron deficiency anemia, unspecified: Secondary | ICD-10-CM | POA: Diagnosis not present

## 2021-05-09 DIAGNOSIS — Z5181 Encounter for therapeutic drug level monitoring: Secondary | ICD-10-CM | POA: Diagnosis not present

## 2021-05-09 DIAGNOSIS — E559 Vitamin D deficiency, unspecified: Secondary | ICD-10-CM | POA: Diagnosis not present

## 2021-05-09 DIAGNOSIS — D513 Other dietary vitamin B12 deficiency anemia: Secondary | ICD-10-CM | POA: Diagnosis not present

## 2021-05-09 DIAGNOSIS — R770 Abnormality of albumin: Secondary | ICD-10-CM | POA: Diagnosis not present

## 2021-05-12 DIAGNOSIS — R1312 Dysphagia, oropharyngeal phase: Secondary | ICD-10-CM | POA: Diagnosis not present

## 2021-05-12 DIAGNOSIS — F0281 Dementia in other diseases classified elsewhere with behavioral disturbance: Secondary | ICD-10-CM | POA: Diagnosis not present

## 2021-05-12 DIAGNOSIS — R2689 Other abnormalities of gait and mobility: Secondary | ICD-10-CM | POA: Diagnosis not present

## 2021-05-12 DIAGNOSIS — M6281 Muscle weakness (generalized): Secondary | ICD-10-CM | POA: Diagnosis not present

## 2021-05-12 DIAGNOSIS — D696 Thrombocytopenia, unspecified: Secondary | ICD-10-CM | POA: Diagnosis not present

## 2021-05-12 DIAGNOSIS — J441 Chronic obstructive pulmonary disease with (acute) exacerbation: Secondary | ICD-10-CM | POA: Diagnosis not present

## 2021-05-13 DIAGNOSIS — R2689 Other abnormalities of gait and mobility: Secondary | ICD-10-CM | POA: Diagnosis not present

## 2021-05-13 DIAGNOSIS — F0281 Dementia in other diseases classified elsewhere with behavioral disturbance: Secondary | ICD-10-CM | POA: Diagnosis not present

## 2021-05-13 DIAGNOSIS — R1312 Dysphagia, oropharyngeal phase: Secondary | ICD-10-CM | POA: Diagnosis not present

## 2021-05-13 DIAGNOSIS — J441 Chronic obstructive pulmonary disease with (acute) exacerbation: Secondary | ICD-10-CM | POA: Diagnosis not present

## 2021-05-13 DIAGNOSIS — M6281 Muscle weakness (generalized): Secondary | ICD-10-CM | POA: Diagnosis not present

## 2021-05-13 DIAGNOSIS — D696 Thrombocytopenia, unspecified: Secondary | ICD-10-CM | POA: Diagnosis not present

## 2021-05-14 DIAGNOSIS — D696 Thrombocytopenia, unspecified: Secondary | ICD-10-CM | POA: Diagnosis not present

## 2021-05-14 DIAGNOSIS — M6281 Muscle weakness (generalized): Secondary | ICD-10-CM | POA: Diagnosis not present

## 2021-05-14 DIAGNOSIS — F0281 Dementia in other diseases classified elsewhere with behavioral disturbance: Secondary | ICD-10-CM | POA: Diagnosis not present

## 2021-05-14 DIAGNOSIS — R2689 Other abnormalities of gait and mobility: Secondary | ICD-10-CM | POA: Diagnosis not present

## 2021-05-14 DIAGNOSIS — J441 Chronic obstructive pulmonary disease with (acute) exacerbation: Secondary | ICD-10-CM | POA: Diagnosis not present

## 2021-05-14 DIAGNOSIS — R1312 Dysphagia, oropharyngeal phase: Secondary | ICD-10-CM | POA: Diagnosis not present

## 2021-05-15 DIAGNOSIS — F0281 Dementia in other diseases classified elsewhere with behavioral disturbance: Secondary | ICD-10-CM | POA: Diagnosis not present

## 2021-05-15 DIAGNOSIS — D696 Thrombocytopenia, unspecified: Secondary | ICD-10-CM | POA: Diagnosis not present

## 2021-05-15 DIAGNOSIS — R2689 Other abnormalities of gait and mobility: Secondary | ICD-10-CM | POA: Diagnosis not present

## 2021-05-15 DIAGNOSIS — M6281 Muscle weakness (generalized): Secondary | ICD-10-CM | POA: Diagnosis not present

## 2021-05-15 DIAGNOSIS — R1312 Dysphagia, oropharyngeal phase: Secondary | ICD-10-CM | POA: Diagnosis not present

## 2021-05-15 DIAGNOSIS — J441 Chronic obstructive pulmonary disease with (acute) exacerbation: Secondary | ICD-10-CM | POA: Diagnosis not present

## 2021-05-16 DIAGNOSIS — R1312 Dysphagia, oropharyngeal phase: Secondary | ICD-10-CM | POA: Diagnosis not present

## 2021-05-16 DIAGNOSIS — R2689 Other abnormalities of gait and mobility: Secondary | ICD-10-CM | POA: Diagnosis not present

## 2021-05-16 DIAGNOSIS — D696 Thrombocytopenia, unspecified: Secondary | ICD-10-CM | POA: Diagnosis not present

## 2021-05-16 DIAGNOSIS — F0281 Dementia in other diseases classified elsewhere with behavioral disturbance: Secondary | ICD-10-CM | POA: Diagnosis not present

## 2021-05-16 DIAGNOSIS — J441 Chronic obstructive pulmonary disease with (acute) exacerbation: Secondary | ICD-10-CM | POA: Diagnosis not present

## 2021-05-16 DIAGNOSIS — M6281 Muscle weakness (generalized): Secondary | ICD-10-CM | POA: Diagnosis not present

## 2021-05-19 DIAGNOSIS — R2689 Other abnormalities of gait and mobility: Secondary | ICD-10-CM | POA: Diagnosis not present

## 2021-05-19 DIAGNOSIS — R1312 Dysphagia, oropharyngeal phase: Secondary | ICD-10-CM | POA: Diagnosis not present

## 2021-05-19 DIAGNOSIS — D696 Thrombocytopenia, unspecified: Secondary | ICD-10-CM | POA: Diagnosis not present

## 2021-05-19 DIAGNOSIS — J441 Chronic obstructive pulmonary disease with (acute) exacerbation: Secondary | ICD-10-CM | POA: Diagnosis not present

## 2021-05-19 DIAGNOSIS — F0281 Dementia in other diseases classified elsewhere with behavioral disturbance: Secondary | ICD-10-CM | POA: Diagnosis not present

## 2021-05-19 DIAGNOSIS — M6281 Muscle weakness (generalized): Secondary | ICD-10-CM | POA: Diagnosis not present

## 2021-05-20 DIAGNOSIS — F0281 Dementia in other diseases classified elsewhere with behavioral disturbance: Secondary | ICD-10-CM | POA: Diagnosis not present

## 2021-05-20 DIAGNOSIS — R2689 Other abnormalities of gait and mobility: Secondary | ICD-10-CM | POA: Diagnosis not present

## 2021-05-20 DIAGNOSIS — D696 Thrombocytopenia, unspecified: Secondary | ICD-10-CM | POA: Diagnosis not present

## 2021-05-20 DIAGNOSIS — J441 Chronic obstructive pulmonary disease with (acute) exacerbation: Secondary | ICD-10-CM | POA: Diagnosis not present

## 2021-05-20 DIAGNOSIS — R1312 Dysphagia, oropharyngeal phase: Secondary | ICD-10-CM | POA: Diagnosis not present

## 2021-05-20 DIAGNOSIS — M6281 Muscle weakness (generalized): Secondary | ICD-10-CM | POA: Diagnosis not present

## 2021-05-21 DIAGNOSIS — F0281 Dementia in other diseases classified elsewhere with behavioral disturbance: Secondary | ICD-10-CM | POA: Diagnosis not present

## 2021-05-21 DIAGNOSIS — R2689 Other abnormalities of gait and mobility: Secondary | ICD-10-CM | POA: Diagnosis not present

## 2021-05-21 DIAGNOSIS — J441 Chronic obstructive pulmonary disease with (acute) exacerbation: Secondary | ICD-10-CM | POA: Diagnosis not present

## 2021-05-21 DIAGNOSIS — R1312 Dysphagia, oropharyngeal phase: Secondary | ICD-10-CM | POA: Diagnosis not present

## 2021-05-21 DIAGNOSIS — M6281 Muscle weakness (generalized): Secondary | ICD-10-CM | POA: Diagnosis not present

## 2021-05-21 DIAGNOSIS — D696 Thrombocytopenia, unspecified: Secondary | ICD-10-CM | POA: Diagnosis not present

## 2021-05-22 DIAGNOSIS — D696 Thrombocytopenia, unspecified: Secondary | ICD-10-CM | POA: Diagnosis not present

## 2021-05-22 DIAGNOSIS — R2689 Other abnormalities of gait and mobility: Secondary | ICD-10-CM | POA: Diagnosis not present

## 2021-05-22 DIAGNOSIS — R1312 Dysphagia, oropharyngeal phase: Secondary | ICD-10-CM | POA: Diagnosis not present

## 2021-05-22 DIAGNOSIS — J441 Chronic obstructive pulmonary disease with (acute) exacerbation: Secondary | ICD-10-CM | POA: Diagnosis not present

## 2021-05-22 DIAGNOSIS — M6281 Muscle weakness (generalized): Secondary | ICD-10-CM | POA: Diagnosis not present

## 2021-05-22 DIAGNOSIS — F0281 Dementia in other diseases classified elsewhere with behavioral disturbance: Secondary | ICD-10-CM | POA: Diagnosis not present

## 2021-05-23 DIAGNOSIS — R1312 Dysphagia, oropharyngeal phase: Secondary | ICD-10-CM | POA: Diagnosis not present

## 2021-05-23 DIAGNOSIS — J441 Chronic obstructive pulmonary disease with (acute) exacerbation: Secondary | ICD-10-CM | POA: Diagnosis not present

## 2021-05-23 DIAGNOSIS — M6281 Muscle weakness (generalized): Secondary | ICD-10-CM | POA: Diagnosis not present

## 2021-05-23 DIAGNOSIS — R2689 Other abnormalities of gait and mobility: Secondary | ICD-10-CM | POA: Diagnosis not present

## 2021-05-23 DIAGNOSIS — D696 Thrombocytopenia, unspecified: Secondary | ICD-10-CM | POA: Diagnosis not present

## 2021-05-23 DIAGNOSIS — F0281 Dementia in other diseases classified elsewhere with behavioral disturbance: Secondary | ICD-10-CM | POA: Diagnosis not present

## 2021-05-24 NOTE — Progress Notes (Signed)
[facility] [place]  Telephone:(336) [phone]  Fax:([phone]     Ariana [name] DOB: July 17, 1933  MR#: [id number]  [id number]  Patient Care Team: Caprice Renshaw, MD as PCP - General (Internal Medicine) Lloyd Huger, MD as Consulting Physician (Oncology)   CHIEF COMPLAINT: Chronic refractory ITP  INTERVAL HISTORY: Patient returns to clinic today for repeat laboratory work, further evaluation, and continuation of Nplate.  She continues to have a poor memory secondary to underlying dementia, but appears to be at her baseline today.  She has chronic weakness and fatigue, but denies any further falling. She denies any easy bleeding or bruising.  She has no neurologic complaints.  She denies any recent fevers or illnesses. She denies any chest pain, shortness of breath, cough, or hemoptysis.  She denies any nausea, vomiting, constipation, or diarrhea.  She has no melena or hematochezia.  She has no urinary complaints.  Patient appears at her baseline offers no further specific complaints today.  REVIEW OF SYSTEMS:   Review of Systems  Constitutional:  Positive for malaise/fatigue. Negative for fever and weight loss.  HENT:  Negative for congestion.   Respiratory: Negative.  Negative for cough, hemoptysis and shortness of breath.   Cardiovascular: Negative.  Negative for chest pain and leg swelling.  Gastrointestinal: Negative.  Negative for abdominal pain, blood in stool, diarrhea, melena and nausea.  Genitourinary: Negative.  Negative for dysuria and hematuria.  Musculoskeletal: Negative.  Negative for back pain, falls and joint pain.  Skin: Negative.  Negative for rash.  Neurological:  Positive for weakness. Negative for dizziness, sensory change, focal weakness and headaches.  Endo/Heme/Allergies:  Does not bruise/bleed easily.  Psychiatric/Behavioral:  Positive for memory loss. Negative for depression. The patient is not nervous/anxious.    As per HPI. Otherwise, a  complete review of systems is negative.  PAST MEDICAL HISTORY: Past Medical History:  Diagnosis Date   Alzheimer disease ([place])    Anemia    Anxiety    Asthma    WELL CONTROLLED   Chronic back pain    Cognitive communication deficit    COPD (chronic obstructive pulmonary disease) (HCC)    Depression    Depression    GERD (gastroesophageal reflux disease)    Hiatal hernia    Hypercholesteremia    Hypothyroidism    Iron deficiency    ITP (idiopathic thrombocytopenic purpura)    FOLLOWED BR DR Grayland Ormond   LBBB (left bundle branch block)    Osteoarthritis    Osteoporosis    Restless legs     PAST SURGICAL HISTORY: Past Surgical History:  Procedure Laterality Date   CARPAL TUNNEL RELEASE     ESOPHAGOGASTRODUODENOSCOPY (EGD) WITH PROPOFOL N/A 09/10/2015   Procedure: ESOPHAGOGASTRODUODENOSCOPY (EGD) WITH PROPOFOL;  Surgeon: Lollie Sails, MD;  Location: [facility] ENDOSCOPY;  Service: Endoscopy;  Laterality: N/A;   HIP PINNING,CANNULATED Left 10/31/2020   Procedure: CANNULATED HIP PINNING;  Surgeon: Leim Fabry, MD;  Location: [facility] ORS;  Service: Orthopedics;  Laterality: Left;   IRRIGATION AND DEBRIDEMENT HEMATOMA Right 07/13/2018   Procedure: IRRIGATION AND DEBRIDEMENT HEMATOMA-RIGHT SHIN;  Surgeon: Herbert Pun, MD;  Location: [facility] ORS;  Service: General;  Laterality: Right;   KYPHOPLASTY N/A 05/02/2019   Procedure: L1 KYPHOPLASTY;  Surgeon: Hessie Knows, MD;  Location: [facility] ORS;  Service: Orthopedics;  Laterality: N/A;   LUMBAR DISC SURGERY     REVERSE SHOULDER ARTHROPLASTY Left 06/25/2020   Procedure: REVERSE SHOULDER ARTHROPLASTY;  Surgeon: Corky Mull, MD;  Location: [facility] ORS;  Service: Orthopedics;  Laterality: Left;   SPLENECTOMY, PARTIAL     TOTAL ABDOMINAL HYSTERECTOMY      FAMILY HISTORY Family History  Problem Relation Age of Onset   Stroke Mother     GYNECOLOGIC HISTORY:  No LMP recorded. Patient has had a hysterectomy.     ADVANCED DIRECTIVES:     HEALTH MAINTENANCE: Social History   Tobacco Use   Smoking status: Never   Smokeless tobacco: Never  Vaping Use   Vaping Use: Never used  Substance Use Topics   Alcohol use: No    Alcohol/week: 0.0 standard drinks   Drug use: No     Allergies  Allergen Reactions   Aspirin     Upset stomach   Diazepam Itching    sneezing   Other     seasonal allergies   Tetanus Toxoids     Localized superficial swelling of skin   Morphine Itching and Rash    Current Outpatient Medications  Medication Sig Dispense Refill   acetaminophen (TYLENOL) 325 MG tablet Take 2 tablets (650 mg total) by mouth every 6 (six) hours as needed for mild pain (or Fever >/= 101). 100 tablet 0   albuterol (PROVENTIL HFA;VENTOLIN HFA) 108 (90 BASE) MCG/ACT inhaler Inhale 2 puffs into the lungs every 6 (six) hours as needed for wheezing or shortness of breath.     ALPRAZolam (XANAX) 0.5 MG tablet Take 1 tablet (0.5 mg total) by mouth 3 (three) times daily as needed for anxiety. 15 tablet 0   amLODipine (NORVASC) 10 MG tablet Take 1 tablet (10 mg total) by mouth daily.     busPIRone (BUSPAR) 7.5 MG tablet Take 1 tablet (7.5 mg total) by mouth 2 (two) times daily.     cholecalciferol (VITAMIN D3) 25 MCG (1000 UNIT) tablet Take 2,000 Units by mouth daily.     citalopram (CELEXA) 20 MG tablet Take 1 tablet (20 mg total) by mouth every morning.     Cyanocobalamin 1000 MCG TBCR Take 1,000 mcg by mouth daily.      fluticasone (FLONASE) 50 MCG/ACT nasal spray Place 2 sprays into both nostrils daily as needed for allergies.     Fluticasone-Salmeterol (ADVAIR) 250-50 MCG/DOSE AEPB Inhale 1 puff into the lungs 2 (two) times daily as needed (shortness of breath).      gabapentin (NEURONTIN) 100 MG capsule Take 2 capsules (200 mg total) by mouth 4 (four) times daily.     iron polysaccharides (NIFEREX) 150 MG capsule Take 1 capsule (150 mg total) by mouth daily.     lamoTRIgine (LAMICTAL) 25 MG tablet Take 50 mg by mouth  at bedtime.     levothyroxine (SYNTHROID, LEVOTHROID) 50 MCG tablet Take 50 mcg by mouth daily before breakfast. Take 30 to 60 minutes before breakfast.     loratadine (CLARITIN) 10 MG tablet Take 1 tablet (10 mg total) by mouth daily.     memantine (NAMENDA) 10 MG tablet Take 10 mg by mouth 2 (two) times daily.     montelukast (SINGULAIR) 10 MG tablet Take 10 mg by mouth at bedtime.     Multiple Vitamin (MULTI-VITAMIN) tablet Take 1 tablet by mouth daily.     Multiple Vitamins-Minerals (PRESERVISION AREDS 2) CAPS Take 1 capsule by mouth 2 (two) times a day.     ondansetron (ZOFRAN) 4 MG tablet Take 1 tablet (4 mg total) by mouth every 6 (six) hours as needed for nausea. 20 tablet 0   oxybutynin (DITROPAN) 5 MG tablet Take 5  mg by mouth 3 (three) times daily.     pantoprazole (PROTONIX) 20 MG tablet Take 20 mg by mouth 2 (two) times daily.      QUEtiapine (SEROQUEL) 25 MG tablet Take 0.5 tablets (12.5 mg total) by mouth every 6 (six) hours as needed (agitation).     rosuvastatin (CRESTOR) 5 MG tablet Take 5 mg by mouth daily.     vitamin E 180 MG (400 UNITS) capsule Take 400 Units by mouth daily.     No current facility-administered medications for this visit.    OBJECTIVE: BP 115/64   Pulse (!) 56   Temp 97.6 F (36.4 C)   Wt 123 lb 8 oz (56 kg)   BMI 20.55 kg/m    Body mass index is 20.55 kg/m.    ECOG FS:2 - Symptomatic, <50% confined to bed  General: Well-developed, well-nourished, no acute distress. Eyes: Pink conjunctiva, anicteric sclera. HEENT: Normocephalic, moist mucous membranes. Lungs: No audible wheezing or coughing. Heart: Regular rate and rhythm. Abdomen: Soft, nontender, no obvious distention. Musculoskeletal: No edema, cyanosis, or clubbing. Neuro: Alert, answering all questions appropriately. Cranial nerves grossly intact. Skin: No rashes or petechiae noted. Psych: Normal affect.   LAB RESULTS:  Appointment on 05/28/2021  Component Date Value Ref Range  Status   WBC 05/28/2021 9.4  4.0 - 10.5 K/uL Final   RBC 05/28/2021 4.66  3.87 - 5.11 MIL/uL Final   Hemoglobin 05/28/2021 14.0  12.0 - 15.0 g/dL Final   HCT 05/28/2021 42.3  36.0 - 46.0 % Final   MCV 05/28/2021 90.8  80.0 - 100.0 fL Final   MCH 05/28/2021 30.0  26.0 - 34.0 pg Final   MCHC 05/28/2021 33.1  30.0 - 36.0 g/dL Final   RDW 05/28/2021 17.3 (A) 11.5 - 15.5 % Final   Platelets 05/28/2021 14 (A) 150 - 400 K/uL Final   Comment: SPECIMEN CHECKED FOR CLOTS Immature Platelet Fraction may be clinically indicated, consider ordering this additional test [id number] REPEATED TO VERIFY THIS CRITICAL RESULT HAS VERIFIED AND BEEN CALLED TO [facility] BY CHASE ISLEY ON 08 17 2022 AT 0935, AND HAS BEEN READ BACK.     nRBC 05/28/2021 0.0  0.0 - 0.2 % Final   Neutrophils Relative % 05/28/2021 64  % Final   Neutro Abs 05/28/2021 6.0  1.7 - 7.7 K/uL Final   Lymphocytes Relative 05/28/2021 24  % Final   Lymphs Abs 05/28/2021 2.3  0.7 - 4.0 K/uL Final   Monocytes Relative 05/28/2021 9  % Final   Monocytes Absolute 05/28/2021 0.8  0.1 - 1.0 K/uL Final   Eosinophils Relative 05/28/2021 2  % Final   Eosinophils Absolute 05/28/2021 0.2  0.0 - 0.5 K/uL Final   Basophils Relative 05/28/2021 1  % Final   Basophils Absolute 05/28/2021 0.1  0.0 - 0.1 K/uL Final   RBC Morphology 05/28/2021 MORPHOLOGY UNREMARKABLE   Final   Smear Review 05/28/2021 Normal platelet morphology   Final   Comment: PLATELETS APPEAR DECREASED PLATELET COUNT CONFIRMED BY SMEAR    Immature Granulocytes 05/28/2021 0  % Final   Abs Immature Granulocytes 05/28/2021 0.02  0.00 - 0.07 K/uL Final   Performed at [facility], [address] [place], [place] [address]    STUDIES: No results found.  ASSESSMENT:  Chronic refractory ITP  PLAN:   1. Chronic refractory ITP: Patient had a poor response to Prednisone, WinRho, and Rituxan. She is status post splenectomy in 2007.  She received IVIG and Nplate while in  the hospital  with significant improvement of her platelet count.   If Nplate no longer was effective, could retry IVIG at a future date.  Patient is currently receiving the max dose of Nplate at 10 mcg/kg.  Can also consider Promacta 12.'5mg'$  or Tavalisse 100 mg BID if needed.  Patient's platelet count is 14 today.  Proceed with Nplate as ordered.  Return to clinic in 4 weeks for further evaluation and continuation of treatment. 2. Weakness and fatigue: Chronic and unchanged 3.  Dementia: Chronic and unchanged.  Patient is now on a full-time memory unit.  Her husband is no longer involved in her care. 4.  Fractured hip: Resolved.   Patient expressed understanding and was in agreement with this plan. She also understands that She can call clinic at any time with any questions, concerns, or complaints.    Lloyd Huger, MD   05/28/2021 3:58 PM

## 2021-05-25 DIAGNOSIS — R309 Painful micturition, unspecified: Secondary | ICD-10-CM | POA: Diagnosis not present

## 2021-05-28 ENCOUNTER — Inpatient Hospital Stay: Payer: Medicare HMO | Attending: Oncology

## 2021-05-28 ENCOUNTER — Inpatient Hospital Stay (HOSPITAL_BASED_OUTPATIENT_CLINIC_OR_DEPARTMENT_OTHER): Payer: Medicare HMO | Admitting: Oncology

## 2021-05-28 ENCOUNTER — Inpatient Hospital Stay: Payer: Medicare HMO

## 2021-05-28 VITALS — BP 115/64 | HR 56 | Temp 97.6°F | Wt 123.5 lb

## 2021-05-28 DIAGNOSIS — D693 Immune thrombocytopenic purpura: Secondary | ICD-10-CM | POA: Insufficient documentation

## 2021-05-28 LAB — CBC WITH DIFFERENTIAL/PLATELET
Abs Immature Granulocytes: 0.02 10*3/uL (ref 0.00–0.07)
Basophils Absolute: 0.1 10*3/uL (ref 0.0–0.1)
Basophils Relative: 1 %
Eosinophils Absolute: 0.2 10*3/uL (ref 0.0–0.5)
Eosinophils Relative: 2 %
HCT: 42.3 % (ref 36.0–46.0)
Hemoglobin: 14 g/dL (ref 12.0–15.0)
Immature Granulocytes: 0 %
Lymphocytes Relative: 24 %
Lymphs Abs: 2.3 10*3/uL (ref 0.7–4.0)
MCH: 30 pg (ref 26.0–34.0)
MCHC: 33.1 g/dL (ref 30.0–36.0)
MCV: 90.8 fL (ref 80.0–100.0)
Monocytes Absolute: 0.8 10*3/uL (ref 0.1–1.0)
Monocytes Relative: 9 %
Neutro Abs: 6 10*3/uL (ref 1.7–7.7)
Neutrophils Relative %: 64 %
Platelets: 14 10*3/uL — CL (ref 150–400)
RBC: 4.66 MIL/uL (ref 3.87–5.11)
RDW: 17.3 % — ABNORMAL HIGH (ref 11.5–15.5)
Smear Review: NORMAL
WBC: 9.4 10*3/uL (ref 4.0–10.5)
nRBC: 0 % (ref 0.0–0.2)

## 2021-05-28 MED ORDER — ROMIPLOSTIM INJECTION 500 MCG
10.0000 ug/kg | Freq: Once | SUBCUTANEOUS | Status: AC
  Administered 2021-05-28: 560 ug via SUBCUTANEOUS
  Filled 2021-05-28: qty 1.12

## 2021-05-29 DIAGNOSIS — D696 Thrombocytopenia, unspecified: Secondary | ICD-10-CM | POA: Diagnosis not present

## 2021-05-29 DIAGNOSIS — R1312 Dysphagia, oropharyngeal phase: Secondary | ICD-10-CM | POA: Diagnosis not present

## 2021-05-29 DIAGNOSIS — M6281 Muscle weakness (generalized): Secondary | ICD-10-CM | POA: Diagnosis not present

## 2021-05-29 DIAGNOSIS — R2689 Other abnormalities of gait and mobility: Secondary | ICD-10-CM | POA: Diagnosis not present

## 2021-05-29 DIAGNOSIS — F0281 Dementia in other diseases classified elsewhere with behavioral disturbance: Secondary | ICD-10-CM | POA: Diagnosis not present

## 2021-05-29 DIAGNOSIS — J441 Chronic obstructive pulmonary disease with (acute) exacerbation: Secondary | ICD-10-CM | POA: Diagnosis not present

## 2021-05-31 DIAGNOSIS — N390 Urinary tract infection, site not specified: Secondary | ICD-10-CM | POA: Diagnosis not present

## 2021-06-02 DIAGNOSIS — M25562 Pain in left knee: Secondary | ICD-10-CM | POA: Diagnosis not present

## 2021-06-02 DIAGNOSIS — R52 Pain, unspecified: Secondary | ICD-10-CM | POA: Diagnosis not present

## 2021-06-02 DIAGNOSIS — Z79899 Other long term (current) drug therapy: Secondary | ICD-10-CM | POA: Diagnosis not present

## 2021-06-02 DIAGNOSIS — R5381 Other malaise: Secondary | ICD-10-CM | POA: Diagnosis not present

## 2021-06-02 DIAGNOSIS — G629 Polyneuropathy, unspecified: Secondary | ICD-10-CM | POA: Diagnosis not present

## 2021-06-03 DIAGNOSIS — R1312 Dysphagia, oropharyngeal phase: Secondary | ICD-10-CM | POA: Diagnosis not present

## 2021-06-03 DIAGNOSIS — J441 Chronic obstructive pulmonary disease with (acute) exacerbation: Secondary | ICD-10-CM | POA: Diagnosis not present

## 2021-06-03 DIAGNOSIS — R2689 Other abnormalities of gait and mobility: Secondary | ICD-10-CM | POA: Diagnosis not present

## 2021-06-03 DIAGNOSIS — D696 Thrombocytopenia, unspecified: Secondary | ICD-10-CM | POA: Diagnosis not present

## 2021-06-03 DIAGNOSIS — F0281 Dementia in other diseases classified elsewhere with behavioral disturbance: Secondary | ICD-10-CM | POA: Diagnosis not present

## 2021-06-03 DIAGNOSIS — M6281 Muscle weakness (generalized): Secondary | ICD-10-CM | POA: Diagnosis not present

## 2021-06-04 DIAGNOSIS — R1312 Dysphagia, oropharyngeal phase: Secondary | ICD-10-CM | POA: Diagnosis not present

## 2021-06-04 DIAGNOSIS — J441 Chronic obstructive pulmonary disease with (acute) exacerbation: Secondary | ICD-10-CM | POA: Diagnosis not present

## 2021-06-04 DIAGNOSIS — F0281 Dementia in other diseases classified elsewhere with behavioral disturbance: Secondary | ICD-10-CM | POA: Diagnosis not present

## 2021-06-04 DIAGNOSIS — M6281 Muscle weakness (generalized): Secondary | ICD-10-CM | POA: Diagnosis not present

## 2021-06-04 DIAGNOSIS — R2689 Other abnormalities of gait and mobility: Secondary | ICD-10-CM | POA: Diagnosis not present

## 2021-06-04 DIAGNOSIS — D696 Thrombocytopenia, unspecified: Secondary | ICD-10-CM | POA: Diagnosis not present

## 2021-06-05 DIAGNOSIS — D696 Thrombocytopenia, unspecified: Secondary | ICD-10-CM | POA: Diagnosis not present

## 2021-06-05 DIAGNOSIS — R2689 Other abnormalities of gait and mobility: Secondary | ICD-10-CM | POA: Diagnosis not present

## 2021-06-05 DIAGNOSIS — K219 Gastro-esophageal reflux disease without esophagitis: Secondary | ICD-10-CM | POA: Diagnosis not present

## 2021-06-05 DIAGNOSIS — Z79899 Other long term (current) drug therapy: Secondary | ICD-10-CM | POA: Diagnosis not present

## 2021-06-05 DIAGNOSIS — E039 Hypothyroidism, unspecified: Secondary | ICD-10-CM | POA: Diagnosis not present

## 2021-06-05 DIAGNOSIS — R52 Pain, unspecified: Secondary | ICD-10-CM | POA: Diagnosis not present

## 2021-06-05 DIAGNOSIS — F0281 Dementia in other diseases classified elsewhere with behavioral disturbance: Secondary | ICD-10-CM | POA: Diagnosis not present

## 2021-06-05 DIAGNOSIS — N390 Urinary tract infection, site not specified: Secondary | ICD-10-CM | POA: Diagnosis not present

## 2021-06-05 DIAGNOSIS — J441 Chronic obstructive pulmonary disease with (acute) exacerbation: Secondary | ICD-10-CM | POA: Diagnosis not present

## 2021-06-05 DIAGNOSIS — F419 Anxiety disorder, unspecified: Secondary | ICD-10-CM | POA: Diagnosis not present

## 2021-06-05 DIAGNOSIS — M6281 Muscle weakness (generalized): Secondary | ICD-10-CM | POA: Diagnosis not present

## 2021-06-05 DIAGNOSIS — R1312 Dysphagia, oropharyngeal phase: Secondary | ICD-10-CM | POA: Diagnosis not present

## 2021-06-09 DIAGNOSIS — F0390 Unspecified dementia without behavioral disturbance: Secondary | ICD-10-CM | POA: Diagnosis not present

## 2021-06-09 DIAGNOSIS — E039 Hypothyroidism, unspecified: Secondary | ICD-10-CM | POA: Diagnosis not present

## 2021-06-09 DIAGNOSIS — J441 Chronic obstructive pulmonary disease with (acute) exacerbation: Secondary | ICD-10-CM | POA: Diagnosis not present

## 2021-06-09 DIAGNOSIS — F32A Depression, unspecified: Secondary | ICD-10-CM | POA: Diagnosis not present

## 2021-06-09 DIAGNOSIS — K219 Gastro-esophageal reflux disease without esophagitis: Secondary | ICD-10-CM | POA: Diagnosis not present

## 2021-06-09 DIAGNOSIS — I10 Essential (primary) hypertension: Secondary | ICD-10-CM | POA: Diagnosis not present

## 2021-06-09 DIAGNOSIS — F0281 Dementia in other diseases classified elsewhere with behavioral disturbance: Secondary | ICD-10-CM | POA: Diagnosis not present

## 2021-06-09 DIAGNOSIS — F419 Anxiety disorder, unspecified: Secondary | ICD-10-CM | POA: Diagnosis not present

## 2021-06-10 DIAGNOSIS — F32A Depression, unspecified: Secondary | ICD-10-CM | POA: Diagnosis not present

## 2021-06-10 DIAGNOSIS — M6281 Muscle weakness (generalized): Secondary | ICD-10-CM | POA: Diagnosis not present

## 2021-06-10 DIAGNOSIS — J441 Chronic obstructive pulmonary disease with (acute) exacerbation: Secondary | ICD-10-CM | POA: Diagnosis not present

## 2021-06-10 DIAGNOSIS — R2689 Other abnormalities of gait and mobility: Secondary | ICD-10-CM | POA: Diagnosis not present

## 2021-06-10 DIAGNOSIS — R52 Pain, unspecified: Secondary | ICD-10-CM | POA: Diagnosis not present

## 2021-06-10 DIAGNOSIS — R1312 Dysphagia, oropharyngeal phase: Secondary | ICD-10-CM | POA: Diagnosis not present

## 2021-06-10 DIAGNOSIS — F0390 Unspecified dementia without behavioral disturbance: Secondary | ICD-10-CM | POA: Diagnosis not present

## 2021-06-10 DIAGNOSIS — F0281 Dementia in other diseases classified elsewhere with behavioral disturbance: Secondary | ICD-10-CM | POA: Diagnosis not present

## 2021-06-10 DIAGNOSIS — D696 Thrombocytopenia, unspecified: Secondary | ICD-10-CM | POA: Diagnosis not present

## 2021-06-11 DIAGNOSIS — J441 Chronic obstructive pulmonary disease with (acute) exacerbation: Secondary | ICD-10-CM | POA: Diagnosis not present

## 2021-06-11 DIAGNOSIS — F0281 Dementia in other diseases classified elsewhere with behavioral disturbance: Secondary | ICD-10-CM | POA: Diagnosis not present

## 2021-06-11 DIAGNOSIS — M6281 Muscle weakness (generalized): Secondary | ICD-10-CM | POA: Diagnosis not present

## 2021-06-11 DIAGNOSIS — R1312 Dysphagia, oropharyngeal phase: Secondary | ICD-10-CM | POA: Diagnosis not present

## 2021-06-11 DIAGNOSIS — D696 Thrombocytopenia, unspecified: Secondary | ICD-10-CM | POA: Diagnosis not present

## 2021-06-11 DIAGNOSIS — R2689 Other abnormalities of gait and mobility: Secondary | ICD-10-CM | POA: Diagnosis not present

## 2021-06-12 DIAGNOSIS — D696 Thrombocytopenia, unspecified: Secondary | ICD-10-CM | POA: Diagnosis not present

## 2021-06-12 DIAGNOSIS — R2689 Other abnormalities of gait and mobility: Secondary | ICD-10-CM | POA: Diagnosis not present

## 2021-06-12 DIAGNOSIS — J441 Chronic obstructive pulmonary disease with (acute) exacerbation: Secondary | ICD-10-CM | POA: Diagnosis not present

## 2021-06-12 DIAGNOSIS — M6281 Muscle weakness (generalized): Secondary | ICD-10-CM | POA: Diagnosis not present

## 2021-06-16 DIAGNOSIS — F0281 Dementia in other diseases classified elsewhere with behavioral disturbance: Secondary | ICD-10-CM | POA: Diagnosis not present

## 2021-06-16 DIAGNOSIS — I10 Essential (primary) hypertension: Secondary | ICD-10-CM | POA: Diagnosis not present

## 2021-06-16 DIAGNOSIS — F419 Anxiety disorder, unspecified: Secondary | ICD-10-CM | POA: Diagnosis not present

## 2021-06-16 DIAGNOSIS — F32A Depression, unspecified: Secondary | ICD-10-CM | POA: Diagnosis not present

## 2021-06-16 DIAGNOSIS — K219 Gastro-esophageal reflux disease without esophagitis: Secondary | ICD-10-CM | POA: Diagnosis not present

## 2021-06-16 DIAGNOSIS — E039 Hypothyroidism, unspecified: Secondary | ICD-10-CM | POA: Diagnosis not present

## 2021-06-16 DIAGNOSIS — Z79899 Other long term (current) drug therapy: Secondary | ICD-10-CM | POA: Diagnosis not present

## 2021-06-17 DIAGNOSIS — R2689 Other abnormalities of gait and mobility: Secondary | ICD-10-CM | POA: Diagnosis not present

## 2021-06-17 DIAGNOSIS — D696 Thrombocytopenia, unspecified: Secondary | ICD-10-CM | POA: Diagnosis not present

## 2021-06-17 DIAGNOSIS — J441 Chronic obstructive pulmonary disease with (acute) exacerbation: Secondary | ICD-10-CM | POA: Diagnosis not present

## 2021-06-17 DIAGNOSIS — M6281 Muscle weakness (generalized): Secondary | ICD-10-CM | POA: Diagnosis not present

## 2021-06-18 DIAGNOSIS — D696 Thrombocytopenia, unspecified: Secondary | ICD-10-CM | POA: Diagnosis not present

## 2021-06-18 DIAGNOSIS — J441 Chronic obstructive pulmonary disease with (acute) exacerbation: Secondary | ICD-10-CM | POA: Diagnosis not present

## 2021-06-18 DIAGNOSIS — R2689 Other abnormalities of gait and mobility: Secondary | ICD-10-CM | POA: Diagnosis not present

## 2021-06-18 DIAGNOSIS — M6281 Muscle weakness (generalized): Secondary | ICD-10-CM | POA: Diagnosis not present

## 2021-06-19 DIAGNOSIS — R2689 Other abnormalities of gait and mobility: Secondary | ICD-10-CM | POA: Diagnosis not present

## 2021-06-19 DIAGNOSIS — J441 Chronic obstructive pulmonary disease with (acute) exacerbation: Secondary | ICD-10-CM | POA: Diagnosis not present

## 2021-06-19 DIAGNOSIS — M6281 Muscle weakness (generalized): Secondary | ICD-10-CM | POA: Diagnosis not present

## 2021-06-19 DIAGNOSIS — D696 Thrombocytopenia, unspecified: Secondary | ICD-10-CM | POA: Diagnosis not present

## 2021-06-21 NOTE — Progress Notes (Signed)
[facility] [place]  Telephone:(336) [phone]  Fax:(336) [phone]     Ariana [name] DOB: 29-May-1933  MR#: [id number]  [id number]  Patient Care Team: Caprice Renshaw, MD as PCP - General (Internal Medicine) Lloyd Huger, MD as Consulting Physician (Oncology)   CHIEF COMPLAINT: Chronic refractory ITP  INTERVAL HISTORY: Patient returns to clinic today for repeat laboratory work, further evaluation, and continuation of Nplate.  She continues to have baseline dementia and a poor memory, but otherwise appears at her baseline. She has chronic weakness and fatigue, but denies any further falling. She denies any easy bleeding or bruising.  She has no neurologic complaints.  She denies any recent fevers or illnesses. She denies any chest pain, shortness of breath, cough, or hemoptysis.  She denies any nausea, vomiting, constipation, or diarrhea.  She has no melena or hematochezia.  She has no urinary complaints.  Patient offers no further specific complaints today.  REVIEW OF SYSTEMS:   Review of Systems  Constitutional:  Positive for malaise/fatigue. Negative for fever and weight loss.  HENT:  Negative for congestion.   Respiratory: Negative.  Negative for cough, hemoptysis and shortness of breath.   Cardiovascular: Negative.  Negative for chest pain and leg swelling.  Gastrointestinal: Negative.  Negative for abdominal pain, blood in stool, diarrhea, melena and nausea.  Genitourinary: Negative.  Negative for dysuria and hematuria.  Musculoskeletal: Negative.  Negative for back pain, falls and joint pain.  Skin: Negative.  Negative for rash.  Neurological:  Positive for weakness. Negative for dizziness, sensory change, focal weakness and headaches.  Endo/Heme/Allergies:  Does not bruise/bleed easily.  Psychiatric/Behavioral:  Positive for memory loss. Negative for depression. The patient is not nervous/anxious.    As per HPI. Otherwise, a complete review of systems is  negative.  PAST MEDICAL HISTORY: Past Medical History:  Diagnosis Date  . Alzheimer disease ([place])   . Anemia   . Anxiety   . Asthma    WELL CONTROLLED  . Chronic back pain   . Cognitive communication deficit   . COPD (chronic obstructive pulmonary disease) ([place])   . Depression   . Depression   . GERD (gastroesophageal reflux disease)   . Hiatal hernia   . Hypercholesteremia   . Hypothyroidism   . Iron deficiency   . ITP (idiopathic thrombocytopenic purpura)    FOLLOWED BR DR Grayland Ormond  . LBBB (left bundle branch block)   . Osteoarthritis   . Osteoporosis   . Restless legs     PAST SURGICAL HISTORY: Past Surgical History:  Procedure Laterality Date  . CARPAL TUNNEL RELEASE    . ESOPHAGOGASTRODUODENOSCOPY (EGD) WITH PROPOFOL N/A 09/10/2015   Procedure: ESOPHAGOGASTRODUODENOSCOPY (EGD) WITH PROPOFOL;  Surgeon: Lollie Sails, MD;  Location: [facility] ENDOSCOPY;  Service: Endoscopy;  Laterality: N/A;  . HIP PINNING,CANNULATED Left 10/31/2020   Procedure: CANNULATED HIP PINNING;  Surgeon: Leim Fabry, MD;  Location: [facility] ORS;  Service: Orthopedics;  Laterality: Left;  . IRRIGATION AND DEBRIDEMENT HEMATOMA Right 07/13/2018   Procedure: IRRIGATION AND DEBRIDEMENT HEMATOMA-RIGHT SHIN;  Surgeon: Herbert Pun, MD;  Location: [facility] ORS;  Service: General;  Laterality: Right;  . KYPHOPLASTY N/A 05/02/2019   Procedure: L1 KYPHOPLASTY;  Surgeon: Hessie Knows, MD;  Location: [facility] ORS;  Service: Orthopedics;  Laterality: N/A;  . LUMBAR [place]    . REVERSE SHOULDER ARTHROPLASTY Left 06/25/2020   Procedure: REVERSE SHOULDER ARTHROPLASTY;  Surgeon: Corky Mull, MD;  Location: [facility] ORS;  Service: Orthopedics;  Laterality: Left;  . SPLENECTOMY,  PARTIAL    . TOTAL ABDOMINAL HYSTERECTOMY      FAMILY HISTORY Family History  Problem Relation Age of Onset  . Stroke Mother     GYNECOLOGIC HISTORY:  No LMP recorded. Patient has had a hysterectomy.     ADVANCED DIRECTIVES:     HEALTH MAINTENANCE: Social History   Tobacco Use  . Smoking status: Never  . Smokeless tobacco: Never  Vaping Use  . Vaping Use: Never used  Substance Use Topics  . Alcohol use: No    Alcohol/week: 0.0 standard drinks  . Drug use: No     Allergies  Allergen Reactions  . Aspirin     Upset stomach  . Diazepam Itching    sneezing  . Other     seasonal allergies  . Tetanus Toxoids     Localized superficial swelling of skin  . Morphine Itching and Rash    Current Outpatient Medications  Medication Sig Dispense Refill  . acetaminophen (TYLENOL) 325 MG tablet Take 2 tablets (650 mg total) by mouth every 6 (six) hours as needed for mild pain (or Fever >/= 101). 100 tablet 0  . albuterol (PROVENTIL HFA;VENTOLIN HFA) 108 (90 BASE) MCG/ACT inhaler Inhale 2 puffs into the lungs every 6 (six) hours as needed for wheezing or shortness of breath.    . ALPRAZolam (XANAX) 0.5 MG tablet Take 1 tablet (0.5 mg total) by mouth 3 (three) times daily as needed for anxiety. 15 tablet 0  . amLODipine (NORVASC) 10 MG tablet Take 1 tablet (10 mg total) by mouth daily.    . busPIRone (BUSPAR) 7.5 MG tablet Take 1 tablet (7.5 mg total) by mouth 2 (two) times daily.    . cholecalciferol (VITAMIN D3) 25 MCG (1000 UNIT) tablet Take 2,000 Units by mouth daily.    . citalopram (CELEXA) 20 MG tablet Take 1 tablet (20 mg total) by mouth every morning.    . Cyanocobalamin 1000 MCG TBCR Take 1,000 mcg by mouth daily.     . fluticasone (FLONASE) 50 MCG/ACT nasal spray Place 2 sprays into both nostrils daily as needed for allergies.    . Fluticasone-Salmeterol (ADVAIR) 250-50 MCG/DOSE AEPB Inhale 1 puff into the lungs 2 (two) times daily as needed (shortness of breath).     . gabapentin (NEURONTIN) 100 MG capsule Take 2 capsules (200 mg total) by mouth 4 (four) times daily.    . iron polysaccharides (NIFEREX) 150 MG capsule Take 1 capsule (150 mg total) by mouth daily.    Marland Kitchen lamoTRIgine (LAMICTAL) 25 MG  tablet Take 50 mg by mouth at bedtime.    Marland Kitchen levothyroxine (SYNTHROID, LEVOTHROID) 50 MCG tablet Take 50 mcg by mouth daily before breakfast. Take 30 to 60 minutes before breakfast.    . loratadine (CLARITIN) 10 MG tablet Take 1 tablet (10 mg total) by mouth daily.    . memantine (NAMENDA) 10 MG tablet Take 10 mg by mouth 2 (two) times daily.    . montelukast (SINGULAIR) 10 MG tablet Take 10 mg by mouth at bedtime.    . Multiple Vitamin (MULTI-VITAMIN) tablet Take 1 tablet by mouth daily.    . Multiple Vitamins-Minerals (PRESERVISION AREDS 2) CAPS Take 1 capsule by mouth 2 (two) times a day.    . ondansetron (ZOFRAN) 4 MG tablet Take 1 tablet (4 mg total) by mouth every 6 (six) hours as needed for nausea. 20 tablet 0  . oxybutynin (DITROPAN) 5 MG tablet Take 5 mg by mouth 3 (three) times daily.    Marland Kitchen  pantoprazole (PROTONIX) 20 MG tablet Take 20 mg by mouth 2 (two) times daily.     . QUEtiapine (SEROQUEL) 25 MG tablet Take 0.5 tablets (12.5 mg total) by mouth every 6 (six) hours as needed (agitation).    . rosuvastatin (CRESTOR) 5 MG tablet Take 5 mg by mouth daily.    . vitamin E 180 MG (400 UNITS) capsule Take 400 Units by mouth daily.     No current facility-administered medications for this visit.    OBJECTIVE: BP 119/61   Pulse 62   Temp (!) 97.5 F (36.4 C)   Wt 131 lb 4.8 oz (59.6 kg)   SpO2 100%   BMI 21.85 kg/m    Body mass index is 21.85 kg/m.    ECOG FS:2 - Symptomatic, <50% confined to bed  General: Well-developed, well-nourished, no acute distress. Eyes: Pink conjunctiva, anicteric sclera. HEENT: Normocephalic, moist mucous membranes. Lungs: No audible wheezing or coughing. Heart: Regular rate and rhythm. Abdomen: Soft, nontender, no obvious distention. Musculoskeletal: No edema, cyanosis, or clubbing. Neuro: Alert, answering all questions appropriately. Cranial nerves grossly intact. Skin: No rashes or petechiae noted. Psych: Normal affect.  LAB  RESULTS:  Appointment on 06/25/2021  Component Date Value Ref Range Status  . WBC 06/25/2021 9.9  4.0 - 10.5 K/uL Final  . RBC 06/25/2021 4.73  3.87 - 5.11 MIL/uL Final  . Hemoglobin 06/25/2021 14.0  12.0 - 15.0 g/dL Final  . HCT 06/25/2021 42.7  36.0 - 46.0 % Final  . MCV 06/25/2021 90.3  80.0 - 100.0 fL Final  . MCH 06/25/2021 29.6  26.0 - 34.0 pg Final  . MCHC 06/25/2021 32.8  30.0 - 36.0 g/dL Final  . RDW 06/25/2021 16.8 (A) 11.5 - 15.5 % Final  . Platelets 06/25/2021 13 (A) 150 - 400 K/uL Final   Comment: SPECIMEN CHECKED FOR CLOTS Immature Platelet Fraction may be clinically indicated, consider ordering this additional test [id number] THIS CRITICAL RESULT HAS VERIFIED AND BEEN CALLED TO DR.Larenzo Caples BY CHASE ISLEY ON 09 14 2022 AT 1022, AND HAS BEEN READ BACK.    Marland Kitchen nRBC 06/25/2021 0.0  0.0 - 0.2 % Final  . Neutrophils Relative % 06/25/2021 63  % Final  . Neutro Abs 06/25/2021 6.2  1.7 - 7.7 K/uL Final  . Lymphocytes Relative 06/25/2021 25  % Final  . Lymphs Abs 06/25/2021 2.5  0.7 - 4.0 K/uL Final  . Monocytes Relative 06/25/2021 10  % Final  . Monocytes Absolute 06/25/2021 1.0  0.1 - 1.0 K/uL Final  . Eosinophils Relative 06/25/2021 1  % Final  . Eosinophils Absolute 06/25/2021 0.1  0.0 - 0.5 K/uL Final  . Basophils Relative 06/25/2021 1  % Final  . Basophils Absolute 06/25/2021 0.1  0.0 - 0.1 K/uL Final  . Smear Review 06/25/2021 PLATELETS APPEAR DECREASED   Final   PLATELET COUNT CONFIRMED BY SMEAR  . Immature Granulocytes 06/25/2021 0  % Final  . Abs Immature Granulocytes 06/25/2021 0.02  0.00 - 0.07 K/uL Final  . Giant PLTs 06/25/2021 PRESENT   Final   Performed at [facility], [address] [place], [place] [address]    STUDIES: No results found.  ASSESSMENT:  Chronic refractory ITP  PLAN:   1. Chronic refractory ITP: Patient had a poor response to Prednisone, WinRho, and Rituxan. She is status post splenectomy in 2007.  She received IVIG and Nplate  while in the hospital with significant improvement of her platelet count.   If Nplate no longer was  effective, could retry IVIG at a future date.  Patient is currently receiving the max dose of Nplate at 10 mcg/kg.  Can also consider Promacta 12.'5mg'$  or Tavalisse 100 mg BID if needed.  Patient's platelet count is 13 today.  Proceed with Nplate as ordered.  Return to clinic in 4 weeks for further evaluation and continuation of treatment.   2. Weakness and fatigue: Chronic and unchanged. 3.  Dementia: Chronic and unchanged.  Patient is now on a full-time memory unit.  Her husband is no longer involved in her care. 4.  Fractured hip: Resolved.  Patient expressed understanding and was in agreement with this plan. She also understands that She can call clinic at any time with any questions, concerns, or complaints.    Lloyd Huger, MD   06/26/2021 1:36 PM

## 2021-06-24 DIAGNOSIS — J441 Chronic obstructive pulmonary disease with (acute) exacerbation: Secondary | ICD-10-CM | POA: Diagnosis not present

## 2021-06-24 DIAGNOSIS — D696 Thrombocytopenia, unspecified: Secondary | ICD-10-CM | POA: Diagnosis not present

## 2021-06-24 DIAGNOSIS — R2689 Other abnormalities of gait and mobility: Secondary | ICD-10-CM | POA: Diagnosis not present

## 2021-06-24 DIAGNOSIS — M6281 Muscle weakness (generalized): Secondary | ICD-10-CM | POA: Diagnosis not present

## 2021-06-25 ENCOUNTER — Inpatient Hospital Stay: Payer: Medicare HMO

## 2021-06-25 ENCOUNTER — Inpatient Hospital Stay: Payer: Medicare HMO | Attending: Oncology

## 2021-06-25 ENCOUNTER — Inpatient Hospital Stay (HOSPITAL_BASED_OUTPATIENT_CLINIC_OR_DEPARTMENT_OTHER): Payer: Medicare HMO | Admitting: Oncology

## 2021-06-25 VITALS — BP 119/61 | HR 62 | Temp 97.5°F | Wt 131.3 lb

## 2021-06-25 DIAGNOSIS — D693 Immune thrombocytopenic purpura: Secondary | ICD-10-CM | POA: Insufficient documentation

## 2021-06-25 DIAGNOSIS — R2689 Other abnormalities of gait and mobility: Secondary | ICD-10-CM | POA: Diagnosis not present

## 2021-06-25 DIAGNOSIS — M6281 Muscle weakness (generalized): Secondary | ICD-10-CM | POA: Diagnosis not present

## 2021-06-25 DIAGNOSIS — J441 Chronic obstructive pulmonary disease with (acute) exacerbation: Secondary | ICD-10-CM | POA: Diagnosis not present

## 2021-06-25 DIAGNOSIS — D696 Thrombocytopenia, unspecified: Secondary | ICD-10-CM | POA: Diagnosis not present

## 2021-06-25 LAB — CBC WITH DIFFERENTIAL/PLATELET
Abs Immature Granulocytes: 0.02 10*3/uL (ref 0.00–0.07)
Basophils Absolute: 0.1 10*3/uL (ref 0.0–0.1)
Basophils Relative: 1 %
Eosinophils Absolute: 0.1 10*3/uL (ref 0.0–0.5)
Eosinophils Relative: 1 %
HCT: 42.7 % (ref 36.0–46.0)
Hemoglobin: 14 g/dL (ref 12.0–15.0)
Immature Granulocytes: 0 %
Lymphocytes Relative: 25 %
Lymphs Abs: 2.5 10*3/uL (ref 0.7–4.0)
MCH: 29.6 pg (ref 26.0–34.0)
MCHC: 32.8 g/dL (ref 30.0–36.0)
MCV: 90.3 fL (ref 80.0–100.0)
Monocytes Absolute: 1 10*3/uL (ref 0.1–1.0)
Monocytes Relative: 10 %
Neutro Abs: 6.2 10*3/uL (ref 1.7–7.7)
Neutrophils Relative %: 63 %
Platelets: 13 10*3/uL — CL (ref 150–400)
RBC: 4.73 MIL/uL (ref 3.87–5.11)
RDW: 16.8 % — ABNORMAL HIGH (ref 11.5–15.5)
Smear Review: DECREASED
WBC: 9.9 10*3/uL (ref 4.0–10.5)
nRBC: 0 % (ref 0.0–0.2)

## 2021-06-25 MED ORDER — ROMIPLOSTIM INJECTION 500 MCG
560.0000 ug | Freq: Once | SUBCUTANEOUS | Status: AC
  Administered 2021-06-25: 560 ug via SUBCUTANEOUS
  Filled 2021-06-25: qty 1.12

## 2021-06-25 NOTE — Progress Notes (Signed)
Pt has no new complaints/concerns at this time.

## 2021-06-26 ENCOUNTER — Encounter: Payer: Self-pay | Admitting: Oncology

## 2021-06-27 DIAGNOSIS — R2689 Other abnormalities of gait and mobility: Secondary | ICD-10-CM | POA: Diagnosis not present

## 2021-06-27 DIAGNOSIS — M6281 Muscle weakness (generalized): Secondary | ICD-10-CM | POA: Diagnosis not present

## 2021-06-27 DIAGNOSIS — J441 Chronic obstructive pulmonary disease with (acute) exacerbation: Secondary | ICD-10-CM | POA: Diagnosis not present

## 2021-06-27 DIAGNOSIS — D696 Thrombocytopenia, unspecified: Secondary | ICD-10-CM | POA: Diagnosis not present

## 2021-07-01 DIAGNOSIS — R2689 Other abnormalities of gait and mobility: Secondary | ICD-10-CM | POA: Diagnosis not present

## 2021-07-01 DIAGNOSIS — M6281 Muscle weakness (generalized): Secondary | ICD-10-CM | POA: Diagnosis not present

## 2021-07-01 DIAGNOSIS — D696 Thrombocytopenia, unspecified: Secondary | ICD-10-CM | POA: Diagnosis not present

## 2021-07-01 DIAGNOSIS — J441 Chronic obstructive pulmonary disease with (acute) exacerbation: Secondary | ICD-10-CM | POA: Diagnosis not present

## 2021-07-02 DIAGNOSIS — J441 Chronic obstructive pulmonary disease with (acute) exacerbation: Secondary | ICD-10-CM | POA: Diagnosis not present

## 2021-07-02 DIAGNOSIS — R2689 Other abnormalities of gait and mobility: Secondary | ICD-10-CM | POA: Diagnosis not present

## 2021-07-02 DIAGNOSIS — D696 Thrombocytopenia, unspecified: Secondary | ICD-10-CM | POA: Diagnosis not present

## 2021-07-02 DIAGNOSIS — M6281 Muscle weakness (generalized): Secondary | ICD-10-CM | POA: Diagnosis not present

## 2021-07-03 DIAGNOSIS — M6281 Muscle weakness (generalized): Secondary | ICD-10-CM | POA: Diagnosis not present

## 2021-07-03 DIAGNOSIS — R2689 Other abnormalities of gait and mobility: Secondary | ICD-10-CM | POA: Diagnosis not present

## 2021-07-03 DIAGNOSIS — J441 Chronic obstructive pulmonary disease with (acute) exacerbation: Secondary | ICD-10-CM | POA: Diagnosis not present

## 2021-07-03 DIAGNOSIS — D696 Thrombocytopenia, unspecified: Secondary | ICD-10-CM | POA: Diagnosis not present

## 2021-07-07 DIAGNOSIS — K219 Gastro-esophageal reflux disease without esophagitis: Secondary | ICD-10-CM | POA: Diagnosis not present

## 2021-07-07 DIAGNOSIS — E039 Hypothyroidism, unspecified: Secondary | ICD-10-CM | POA: Diagnosis not present

## 2021-07-07 DIAGNOSIS — R52 Pain, unspecified: Secondary | ICD-10-CM | POA: Diagnosis not present

## 2021-07-07 DIAGNOSIS — F0281 Dementia in other diseases classified elsewhere with behavioral disturbance: Secondary | ICD-10-CM | POA: Diagnosis not present

## 2021-07-07 DIAGNOSIS — J441 Chronic obstructive pulmonary disease with (acute) exacerbation: Secondary | ICD-10-CM | POA: Diagnosis not present

## 2021-07-07 DIAGNOSIS — R5381 Other malaise: Secondary | ICD-10-CM | POA: Diagnosis not present

## 2021-07-07 DIAGNOSIS — R051 Acute cough: Secondary | ICD-10-CM | POA: Diagnosis not present

## 2021-07-07 DIAGNOSIS — G629 Polyneuropathy, unspecified: Secondary | ICD-10-CM | POA: Diagnosis not present

## 2021-07-07 DIAGNOSIS — F419 Anxiety disorder, unspecified: Secondary | ICD-10-CM | POA: Diagnosis not present

## 2021-07-08 DIAGNOSIS — J441 Chronic obstructive pulmonary disease with (acute) exacerbation: Secondary | ICD-10-CM | POA: Diagnosis not present

## 2021-07-08 DIAGNOSIS — M6281 Muscle weakness (generalized): Secondary | ICD-10-CM | POA: Diagnosis not present

## 2021-07-08 DIAGNOSIS — R2689 Other abnormalities of gait and mobility: Secondary | ICD-10-CM | POA: Diagnosis not present

## 2021-07-08 DIAGNOSIS — D696 Thrombocytopenia, unspecified: Secondary | ICD-10-CM | POA: Diagnosis not present

## 2021-07-09 DIAGNOSIS — R2689 Other abnormalities of gait and mobility: Secondary | ICD-10-CM | POA: Diagnosis not present

## 2021-07-09 DIAGNOSIS — D696 Thrombocytopenia, unspecified: Secondary | ICD-10-CM | POA: Diagnosis not present

## 2021-07-09 DIAGNOSIS — M25552 Pain in left hip: Secondary | ICD-10-CM | POA: Diagnosis not present

## 2021-07-09 DIAGNOSIS — M6281 Muscle weakness (generalized): Secondary | ICD-10-CM | POA: Diagnosis not present

## 2021-07-09 DIAGNOSIS — J441 Chronic obstructive pulmonary disease with (acute) exacerbation: Secondary | ICD-10-CM | POA: Diagnosis not present

## 2021-07-10 DIAGNOSIS — K219 Gastro-esophageal reflux disease without esophagitis: Secondary | ICD-10-CM | POA: Diagnosis not present

## 2021-07-10 DIAGNOSIS — F411 Generalized anxiety disorder: Secondary | ICD-10-CM | POA: Diagnosis not present

## 2021-07-10 DIAGNOSIS — G629 Polyneuropathy, unspecified: Secondary | ICD-10-CM | POA: Diagnosis not present

## 2021-07-10 DIAGNOSIS — E039 Hypothyroidism, unspecified: Secondary | ICD-10-CM | POA: Diagnosis not present

## 2021-07-10 DIAGNOSIS — J441 Chronic obstructive pulmonary disease with (acute) exacerbation: Secondary | ICD-10-CM | POA: Diagnosis not present

## 2021-07-10 DIAGNOSIS — R2689 Other abnormalities of gait and mobility: Secondary | ICD-10-CM | POA: Diagnosis not present

## 2021-07-10 DIAGNOSIS — R5381 Other malaise: Secondary | ICD-10-CM | POA: Diagnosis not present

## 2021-07-10 DIAGNOSIS — M25552 Pain in left hip: Secondary | ICD-10-CM | POA: Diagnosis not present

## 2021-07-10 DIAGNOSIS — M6281 Muscle weakness (generalized): Secondary | ICD-10-CM | POA: Diagnosis not present

## 2021-07-10 DIAGNOSIS — F0390 Unspecified dementia without behavioral disturbance: Secondary | ICD-10-CM | POA: Diagnosis not present

## 2021-07-10 DIAGNOSIS — F0281 Dementia in other diseases classified elsewhere with behavioral disturbance: Secondary | ICD-10-CM | POA: Diagnosis not present

## 2021-07-10 DIAGNOSIS — D696 Thrombocytopenia, unspecified: Secondary | ICD-10-CM | POA: Diagnosis not present

## 2021-07-11 ENCOUNTER — Other Ambulatory Visit: Payer: Self-pay

## 2021-07-11 ENCOUNTER — Emergency Department (HOSPITAL_COMMUNITY)
Admission: EM | Admit: 2021-07-11 | Discharge: 2021-07-11 | Disposition: A | Discharge status: Home / Self Care | Payer: Medicare HMO | Source: Home / Self Care | Attending: Emergency Medicine | Admitting: Emergency Medicine

## 2021-07-11 ENCOUNTER — Encounter (HOSPITAL_COMMUNITY): Payer: Self-pay

## 2021-07-11 ENCOUNTER — Emergency Department (HOSPITAL_COMMUNITY): Payer: Medicare HMO

## 2021-07-11 DIAGNOSIS — F32A Depression, unspecified: Secondary | ICD-10-CM | POA: Diagnosis present

## 2021-07-11 DIAGNOSIS — J449 Chronic obstructive pulmonary disease, unspecified: Secondary | ICD-10-CM | POA: Insufficient documentation

## 2021-07-11 DIAGNOSIS — E039 Hypothyroidism, unspecified: Secondary | ICD-10-CM | POA: Insufficient documentation

## 2021-07-11 DIAGNOSIS — G319 Degenerative disease of nervous system, unspecified: Secondary | ICD-10-CM | POA: Diagnosis not present

## 2021-07-11 DIAGNOSIS — R0789 Other chest pain: Secondary | ICD-10-CM | POA: Diagnosis not present

## 2021-07-11 DIAGNOSIS — R41841 Cognitive communication deficit: Secondary | ICD-10-CM | POA: Diagnosis present

## 2021-07-11 DIAGNOSIS — E876 Hypokalemia: Secondary | ICD-10-CM | POA: Diagnosis present

## 2021-07-11 DIAGNOSIS — Z885 Allergy status to narcotic agent status: Secondary | ICD-10-CM | POA: Diagnosis not present

## 2021-07-11 DIAGNOSIS — R404 Transient alteration of awareness: Secondary | ICD-10-CM | POA: Diagnosis not present

## 2021-07-11 DIAGNOSIS — Z28310 Unvaccinated for covid-19: Secondary | ICD-10-CM | POA: Diagnosis not present

## 2021-07-11 DIAGNOSIS — E7800 Pure hypercholesterolemia, unspecified: Secondary | ICD-10-CM | POA: Diagnosis present

## 2021-07-11 DIAGNOSIS — Z823 Family history of stroke: Secondary | ICD-10-CM | POA: Diagnosis not present

## 2021-07-11 DIAGNOSIS — D696 Thrombocytopenia, unspecified: Secondary | ICD-10-CM | POA: Diagnosis not present

## 2021-07-11 DIAGNOSIS — M810 Age-related osteoporosis without current pathological fracture: Secondary | ICD-10-CM | POA: Diagnosis present

## 2021-07-11 DIAGNOSIS — R9431 Abnormal electrocardiogram [ECG] [EKG]: Secondary | ICD-10-CM | POA: Diagnosis not present

## 2021-07-11 DIAGNOSIS — I10 Essential (primary) hypertension: Secondary | ICD-10-CM | POA: Insufficient documentation

## 2021-07-11 DIAGNOSIS — Z79899 Other long term (current) drug therapy: Secondary | ICD-10-CM | POA: Insufficient documentation

## 2021-07-11 DIAGNOSIS — G301 Alzheimer's disease with late onset: Secondary | ICD-10-CM | POA: Diagnosis not present

## 2021-07-11 DIAGNOSIS — I700 Atherosclerosis of aorta: Secondary | ICD-10-CM | POA: Diagnosis not present

## 2021-07-11 DIAGNOSIS — I609 Nontraumatic subarachnoid hemorrhage, unspecified: Secondary | ICD-10-CM

## 2021-07-11 DIAGNOSIS — M4319 Spondylolisthesis, multiple sites in spine: Secondary | ICD-10-CM | POA: Diagnosis not present

## 2021-07-11 DIAGNOSIS — Z6820 Body mass index (BMI) 20.0-20.9, adult: Secondary | ICD-10-CM | POA: Diagnosis not present

## 2021-07-11 DIAGNOSIS — E440 Moderate protein-calorie malnutrition: Secondary | ICD-10-CM | POA: Diagnosis not present

## 2021-07-11 DIAGNOSIS — Y92129 Unspecified place in nursing home as the place of occurrence of the external cause: Secondary | ICD-10-CM | POA: Diagnosis not present

## 2021-07-11 DIAGNOSIS — F0280 Dementia in other diseases classified elsewhere without behavioral disturbance: Secondary | ICD-10-CM | POA: Diagnosis not present

## 2021-07-11 DIAGNOSIS — S066X0A Traumatic subarachnoid hemorrhage without loss of consciousness, initial encounter: Secondary | ICD-10-CM | POA: Insufficient documentation

## 2021-07-11 DIAGNOSIS — I619 Nontraumatic intracerebral hemorrhage, unspecified: Secondary | ICD-10-CM | POA: Diagnosis not present

## 2021-07-11 DIAGNOSIS — F02818 Dementia in other diseases classified elsewhere, unspecified severity, with other behavioral disturbance: Secondary | ICD-10-CM | POA: Diagnosis not present

## 2021-07-11 DIAGNOSIS — M879 Osteonecrosis, unspecified: Secondary | ICD-10-CM

## 2021-07-11 DIAGNOSIS — G2581 Restless legs syndrome: Secondary | ICD-10-CM | POA: Diagnosis present

## 2021-07-11 DIAGNOSIS — D72829 Elevated white blood cell count, unspecified: Secondary | ICD-10-CM | POA: Diagnosis not present

## 2021-07-11 DIAGNOSIS — Z20822 Contact with and (suspected) exposure to covid-19: Secondary | ICD-10-CM | POA: Diagnosis not present

## 2021-07-11 DIAGNOSIS — Z9889 Other specified postprocedural states: Secondary | ICD-10-CM | POA: Diagnosis not present

## 2021-07-11 DIAGNOSIS — S199XXA Unspecified injury of neck, initial encounter: Secondary | ICD-10-CM | POA: Diagnosis not present

## 2021-07-11 DIAGNOSIS — I629 Nontraumatic intracranial hemorrhage, unspecified: Secondary | ICD-10-CM | POA: Diagnosis present

## 2021-07-11 DIAGNOSIS — F419 Anxiety disorder, unspecified: Secondary | ICD-10-CM | POA: Diagnosis present

## 2021-07-11 DIAGNOSIS — S0181XA Laceration without foreign body of other part of head, initial encounter: Secondary | ICD-10-CM | POA: Insufficient documentation

## 2021-07-11 DIAGNOSIS — M25552 Pain in left hip: Secondary | ICD-10-CM | POA: Diagnosis not present

## 2021-07-11 DIAGNOSIS — W19XXXA Unspecified fall, initial encounter: Secondary | ICD-10-CM | POA: Insufficient documentation

## 2021-07-11 DIAGNOSIS — M87051 Idiopathic aseptic necrosis of right femur: Secondary | ICD-10-CM | POA: Insufficient documentation

## 2021-07-11 DIAGNOSIS — Z7401 Bed confinement status: Secondary | ICD-10-CM | POA: Diagnosis not present

## 2021-07-11 DIAGNOSIS — R4182 Altered mental status, unspecified: Secondary | ICD-10-CM | POA: Diagnosis not present

## 2021-07-11 DIAGNOSIS — G932 Benign intracranial hypertension: Secondary | ICD-10-CM | POA: Diagnosis present

## 2021-07-11 DIAGNOSIS — J45909 Unspecified asthma, uncomplicated: Secondary | ICD-10-CM | POA: Insufficient documentation

## 2021-07-11 DIAGNOSIS — K219 Gastro-esophageal reflux disease without esophagitis: Secondary | ICD-10-CM | POA: Diagnosis not present

## 2021-07-11 DIAGNOSIS — W1830XA Fall on same level, unspecified, initial encounter: Secondary | ICD-10-CM | POA: Diagnosis present

## 2021-07-11 DIAGNOSIS — S51812A Laceration without foreign body of left forearm, initial encounter: Secondary | ICD-10-CM | POA: Insufficient documentation

## 2021-07-11 DIAGNOSIS — S066XAA Traumatic subarachnoid hemorrhage with loss of consciousness status unknown, initial encounter: Secondary | ICD-10-CM | POA: Diagnosis not present

## 2021-07-11 DIAGNOSIS — Z66 Do not resuscitate: Secondary | ICD-10-CM | POA: Diagnosis not present

## 2021-07-11 DIAGNOSIS — Z96612 Presence of left artificial shoulder joint: Secondary | ICD-10-CM | POA: Diagnosis present

## 2021-07-11 LAB — CBC WITH DIFFERENTIAL/PLATELET
Abs Immature Granulocytes: 0.09 10*3/uL — ABNORMAL HIGH (ref 0.00–0.07)
Basophils Absolute: 0.1 10*3/uL (ref 0.0–0.1)
Basophils Relative: 1 %
Eosinophils Absolute: 0.3 10*3/uL (ref 0.0–0.5)
Eosinophils Relative: 2 %
HCT: 43.8 % (ref 36.0–46.0)
Hemoglobin: 14.4 g/dL (ref 12.0–15.0)
Immature Granulocytes: 1 %
Lymphocytes Relative: 20 %
Lymphs Abs: 3.1 10*3/uL (ref 0.7–4.0)
MCH: 30.3 pg (ref 26.0–34.0)
MCHC: 32.9 g/dL (ref 30.0–36.0)
MCV: 92 fL (ref 80.0–100.0)
Monocytes Absolute: 1.1 10*3/uL — ABNORMAL HIGH (ref 0.1–1.0)
Monocytes Relative: 7 %
Neutro Abs: 10.9 10*3/uL — ABNORMAL HIGH (ref 1.7–7.7)
Neutrophils Relative %: 69 %
Platelets: 351 10*3/uL (ref 150–400)
RBC: 4.76 MIL/uL (ref 3.87–5.11)
RDW: 17.5 % — ABNORMAL HIGH (ref 11.5–15.5)
WBC: 15.6 10*3/uL — ABNORMAL HIGH (ref 4.0–10.5)
nRBC: 0 % (ref 0.0–0.2)

## 2021-07-11 LAB — PROTIME-INR
INR: 0.9 (ref 0.8–1.2)
Prothrombin Time: 12.3 seconds (ref 11.4–15.2)

## 2021-07-11 LAB — COMPREHENSIVE METABOLIC PANEL
ALT: 27 U/L (ref 0–44)
AST: 29 U/L (ref 15–41)
Albumin: 4.3 g/dL (ref 3.5–5.0)
Alkaline Phosphatase: 152 U/L — ABNORMAL HIGH (ref 38–126)
Anion gap: 10 (ref 5–15)
BUN: 27 mg/dL — ABNORMAL HIGH (ref 8–23)
CO2: 27 mmol/L (ref 22–32)
Calcium: 9.4 mg/dL (ref 8.9–10.3)
Chloride: 104 mmol/L (ref 98–111)
Creatinine, Ser: 0.86 mg/dL (ref 0.44–1.00)
GFR, Estimated: >60 mL/min (ref >60)
Glucose, Bld: 87 mg/dL (ref 70–99)
Potassium: 4 mmol/L (ref 3.5–5.1)
Sodium: 141 mmol/L (ref 135–145)
Total Bilirubin: 0.5 mg/dL (ref 0.3–1.2)
Total Protein: 7.4 g/dL (ref 6.5–8.1)

## 2021-07-11 MED ORDER — FENTANYL CITRATE PF 50 MCG/ML IJ SOSY
50.0000 ug | PREFILLED_SYRINGE | INTRAMUSCULAR | Status: DC | PRN
  Administered 2021-07-11: 50 ug via INTRAVENOUS
  Filled 2021-07-11: qty 1

## 2021-07-11 MED ORDER — ACETAMINOPHEN 325 MG PO TABS
650.0000 mg | ORAL_TABLET | Freq: Once | ORAL | Status: AC
  Administered 2021-07-11: 650 mg via ORAL
  Filled 2021-07-11: qty 2

## 2021-07-11 NOTE — ED Triage Notes (Signed)
Pt came from [facility] from a fall today. Left eyebrow, left lip, teeth missing, left forearm skin tear. Pt is confused on baseline.

## 2021-07-11 NOTE — ED Notes (Signed)
Pt Right hand finger skin tear cleaned and dressed, right forearm skin tear cleaned and dressed, forehead laceration cleaned and clean non stick dressing layed over site.

## 2021-07-11 NOTE — ED Notes (Signed)
Admission record form [facility] says pt is DNR but no DNR paper came with pt.

## 2021-07-11 NOTE — ED Provider Notes (Signed)
[facility] EMERGENCY DEPARTMENT Provider Note   CSN: [id number] Arrival date & time: 07/11/21  1754     History Chief Complaint  Patient presents with   [name]    Ariana [name] is a 87 y.o. female.  HPI    87 year old female comes in with chief complaint of fall.  Patient has history of COPD, dementia and resides at [facility].  I spoke with Ariana [name], care provider at the facility.  She reported that she had encountered patient on Wednesday.  Patient was having some discomfort over her left hip and an x-ray was ordered by her at that time.  Ariana [name] did not work yesterday, but today when she came in patient was still having some discomfort and difficulty with mobility and ultimately she fell prior to being sent to the ER.  The fall was unwitnessed.  Patient is complaining of some discomfort over the left hip and around her mouth.  She also has mild headache.  She denies any neck pain, chest pain, shortness of breath, abdominal pain.   Past Medical History:  Diagnosis Date   Alzheimer disease ([place])    Anemia    Anxiety    Asthma    WELL CONTROLLED   Chronic back pain    Cognitive communication deficit    COPD (chronic obstructive pulmonary disease) (HCC)    Depression    Depression    GERD (gastroesophageal reflux disease)    Hiatal hernia    Hypercholesteremia    Hypothyroidism    Iron deficiency    ITP (idiopathic thrombocytopenic purpura)    FOLLOWED BR DR Grayland Ormond   LBBB (left bundle branch block)    Osteoarthritis    Osteoporosis    Restless legs     Patient Active Problem List   Diagnosis Date Noted   Hiatal hernia 04/09/2021   GERD (gastroesophageal reflux disease) 04/09/2021   Normocytic anemia    Skin tear of right hand without complication    Fall    Malnutrition of moderate degree 01/09/2021   Palliative care encounter    Closed displaced fracture of left femoral neck ([place]) 10/30/2020   Rectal bleeding 08/18/2020   Leukocytosis  08/18/2020   Status post reverse total shoulder replacement, left 06/25/2020   Rotator cuff arthropathy, left 06/24/2020   Failure to thrive in adult 02/16/2020   Thrombocytopenia ([place]) 02/15/2020   Pneumonia 07/10/2019   Laceration of left lower leg 04/10/2019   Chronic pain disorder 01/11/2019   OSA (obstructive sleep apnea) 09/28/2018   Ulcer of lower limb, right, limited to breakdown of skin ([place]) 07/22/2018   Hematoma of lower limb, right, initial encounter 07/18/2018   Traumatic hematoma of right lower leg 07/12/2018   Asthma without status asthmaticus 11/25/2016   Late onset Alzheimer's disease without behavioral disturbance ([place]) 12/30/2015   Back muscle spasm 09/09/2015   Clinical depression 04/12/2015   Cellulitis 04/12/2015   Acute ITP ([place]) 03/15/2015   Chest pain 10/22/2014   Breathlessness on exertion 10/22/2014   Osteopenia 10/18/2014   Benign essential hypertension 06/28/2014   HLD (hyperlipidemia) 06/28/2014   Idiopathic thrombocythemia ([place]) 06/28/2014   Acquired hypothyroidism 06/04/2014   Depressive disorder 06/04/2014   Bursitis, trochanteric 03/27/2014   DDD (degenerative disc disease), lumbar 02/27/2014   Neuritis or radiculitis due to rupture of lumbar intervertebral disc 02/27/2014   Lumbar radiculitis 02/27/2014    Past Surgical History:  Procedure Laterality Date   CARPAL TUNNEL RELEASE     ESOPHAGOGASTRODUODENOSCOPY (EGD) WITH PROPOFOL N/A 09/10/2015  Procedure: ESOPHAGOGASTRODUODENOSCOPY (EGD) WITH PROPOFOL;  Surgeon: Lollie Sails, MD;  Location[facility] ENDOSCOPY;  Service: Endoscopy;  Laterality: N/A;   HIP PINNING,CANNULATED Left 10/31/2020   Procedure: CANNULATED HIP PINNING;  Surgeon: Leim Fabry, MD;  Location: [facility] ORS;  Service: Orthopedics;  Laterality: Left;   IRRIGATION AND DEBRIDEMENT HEMATOMA Right 07/13/2018   Procedure: IRRIGATION AND DEBRIDEMENT HEMATOMA-RIGHT SHIN;  Surgeon: Herbert Pun, MD;  Location: [facility] ORS;   Service: General;  Laterality: Right;   KYPHOPLASTY N/A 05/02/2019   Procedure: L1 KYPHOPLASTY;  Surgeon: Hessie Knows, MD;  Location: [facility] ORS;  Service: Orthopedics;  Laterality: N/A;   LUMBAR DISC SURGERY     REVERSE SHOULDER ARTHROPLASTY Left 06/25/2020   Procedure: REVERSE SHOULDER ARTHROPLASTY;  Surgeon: Corky Mull, MD;  Location: [facility] ORS;  Service: Orthopedics;  Laterality: Left;   SPLENECTOMY, PARTIAL     TOTAL ABDOMINAL HYSTERECTOMY       OB History   No obstetric history on file.     Family History  Problem Relation Age of Onset   Stroke Mother     Social History   Tobacco Use   Smoking status: Never   Smokeless tobacco: Never  Vaping Use   Vaping Use: Never used  Substance Use Topics   Alcohol use: No    Alcohol/week: 0.0 standard drinks   Drug use: No    Home Medications Prior to Admission medications   Medication Sig Start Date End Date Taking? Authorizing Provider  acetaminophen (TYLENOL) 325 MG tablet Take 2 tablets (650 mg total) by mouth every 6 (six) hours as needed for mild pain (or Fever >/= 101). 07/02/15   Idelle Crouch, MD  albuterol (PROVENTIL HFA;VENTOLIN HFA) 108 (90 BASE) MCG/ACT inhaler Inhale 2 puffs into the lungs every 6 (six) hours as needed for wheezing or shortness of breath.    [provider]  ALPRAZolam Duanne Moron) 0.5 MG tablet Take 1 tablet (0.5 mg total) by mouth 3 (three) times daily as needed for anxiety. 02/06/21   Johnson, Clanford L, MD  amLODipine (NORVASC) 10 MG tablet Take 1 tablet (10 mg total) by mouth daily. 01/17/21   Ezekiel Slocumb, DO  busPIRone (BUSPAR) 7.5 MG tablet Take 1 tablet (7.5 mg total) by mouth 2 (two) times daily. 01/17/21   Ezekiel Slocumb, DO  cholecalciferol (VITAMIN D3) 25 MCG (1000 UNIT) tablet Take 2,000 Units by mouth daily.    [provider]  citalopram (CELEXA) 20 MG tablet Take 1 tablet (20 mg total) by mouth every morning. 02/06/21   Johnson, Clanford L, MD  Cyanocobalamin 1000  MCG TBCR Take 1,000 mcg by mouth daily.     [provider]  fluticasone (FLONASE) 50 MCG/ACT nasal spray Place 2 sprays into both nostrils daily as needed for allergies. 03/03/17   [provider]  Fluticasone-Salmeterol (ADVAIR) 250-50 MCG/DOSE AEPB Inhale 1 puff into the lungs 2 (two) times daily as needed (shortness of breath).     [provider]  gabapentin (NEURONTIN) 100 MG capsule Take 2 capsules (200 mg total) by mouth 4 (four) times daily. 02/06/21   Johnson, Clanford L, MD  iron polysaccharides (NIFEREX) 150 MG capsule Take 1 capsule (150 mg total) by mouth daily. 01/17/21   Ezekiel Slocumb, DO  lamoTRIgine (LAMICTAL) 25 MG tablet Take 50 mg by mouth at bedtime. 08/05/20   [provider]  levothyroxine (SYNTHROID, LEVOTHROID) 50 MCG tablet Take 50 mcg by mouth daily before breakfast. Take 30 to 60 minutes before  breakfast.    [provider]  loratadine (CLARITIN) 10 MG tablet Take 1 tablet (10 mg total) by mouth daily. 01/17/21   Ezekiel Slocumb, DO  memantine (NAMENDA) 10 MG tablet Take 10 mg by mouth 2 (two) times daily.    [provider]  montelukast (SINGULAIR) 10 MG tablet Take 10 mg by mouth at bedtime.    [provider]  Multiple Vitamin (MULTI-VITAMIN) tablet Take 1 tablet by mouth daily.    [provider]  Multiple Vitamins-Minerals (PRESERVISION AREDS 2) CAPS Take 1 capsule by mouth 2 (two) times a day.    [provider]  ondansetron (ZOFRAN) 4 MG tablet Take 1 tablet (4 mg total) by mouth every 6 (six) hours as needed for nausea. 01/17/21   Ezekiel Slocumb, DO  oxybutynin (DITROPAN) 5 MG tablet Take 5 mg by mouth 3 (three) times daily.    [provider]  pantoprazole (PROTONIX) 20 MG tablet Take 20 mg by mouth 2 (two) times daily.  07/24/15   [provider]  QUEtiapine (SEROQUEL) 25 MG tablet Take 0.5 tablets (12.5 mg total) by mouth every 6 (six) hours as needed (agitation).  01/17/21   Nicole Kindred A, DO  rosuvastatin (CRESTOR) 5 MG tablet Take 5 mg by mouth daily.    [provider]  vitamin E 180 MG (400 UNITS) capsule Take 400 Units by mouth daily.    [provider]    Allergies    Aspirin, Diazepam, Other, Tetanus toxoids, and Morphine  Review of Systems   Review of Systems  Constitutional:  Positive for activity change.  Respiratory:  Negative for shortness of breath.   Cardiovascular:  Negative for chest pain.  Musculoskeletal:  Positive for arthralgias.  Skin:  Positive for wound.  Hematological:  Bruises/bleeds easily.   Physical Exam Updated Vital Signs BP 135/70   Pulse 82   Temp 98.9 F (37.2 C) (Oral)   Resp 13   Ht 5\' 7"  (1.702 m)   Wt 59.6 kg   SpO2 97%   BMI 20.56 kg/m   Physical Exam Vitals and nursing note reviewed.  Constitutional:      Appearance: She is well-developed.  HENT:     Head: Atraumatic.  Neck:     Comments: No midline c-spine tenderness, pt able to turn head to 45 degrees bilaterally without any pain and able to flex neck to the chest and extend without any pain or neurologic symptoms.  Cardiovascular:     Rate and Rhythm: Normal rate.  Pulmonary:     Effort: Pulmonary effort is normal.  Musculoskeletal:        General: Tenderness present.     Cervical back: Normal range of motion and neck supple.     Comments: L hip tenderness. 3 cm lac to the L forehead, below the eye brow. L forearm has a 4 cm skin tear Patient has some dry blood around her lip and in her mouth.  Edges of some of the teeth are jagged.  Skin:    General: Skin is warm and dry.     Findings: Bruising and erythema present.  Neurological:     Mental Status: She is alert. Mental status is at baseline.    ED Results / Procedures / Treatments   Labs (all labs ordered are listed, but only abnormal results are displayed) Labs Reviewed  COMPREHENSIVE METABOLIC PANEL - Abnormal; Notable for the following components:       Result Value  BUN 27 (*)    Alkaline Phosphatase 152 (*)    All other components within normal limits  CBC WITH DIFFERENTIAL/PLATELET - Abnormal; Notable for the following components:   WBC 15.6 (*)    RDW 17.5 (*)    Neutro Abs 10.9 (*)    Monocytes Absolute 1.1 (*)    Abs Immature Granulocytes 0.09 (*)    All other components within normal limits  PROTIME-INR  CBC WITH DIFFERENTIAL/PLATELET  TYPE AND SCREEN    EKG EKG Interpretation  Date/Time:  Friday July 11 2021 18:01:18 EDT Ventricular Rate:  80 PR Interval:  180 QRS Duration: 151 QT Interval:  448 QTC Calculation: 517 R Axis:   -36 Text Interpretation: Sinus rhythm Left bundle branch block LBBB appears new Confirmed by Varney Biles [phone]) on 07/11/2021 6:57:16 PM  Radiology DG Chest 1 View  Result Date: 07/11/2021 CLINICAL DATA:  Recent fall with left-sided pain, initial encounter EXAM: PORTABLE CHEST 1 VIEW COMPARISON:  05/04/2021 FINDINGS: Cardiac shadow is mildly prominent but stable. Aortic calcifications are noted. The lungs are well aerated bilaterally. No focal infiltrate or sizable effusion is seen. Postsurgical changes in the left shoulder are noted. No acute bony abnormality is seen. IMPRESSION: No acute abnormality noted. Electronically Signed   By: Inez Catalina M.D.   On: 07/11/2021 19:31   CT HEAD WO CONTRAST  Result Date: 07/11/2021 CLINICAL DATA:  Recent fall with facial swelling, initial encounter EXAM: CT HEAD WITHOUT CONTRAST TECHNIQUE: Contiguous axial images were obtained from the base of the skull through the vertex without intravenous contrast. COMPARISON:  02/03/2021 FINDINGS: Brain: Chronic atrophic and ischemic changes are again identified. There is minimal subarachnoid hemorrhage along the medial aspect of the left frontal lobe best seen on images 16 through 18 of series 2. No other focal area of hemorrhage is identified. Vascular: No hyperdense vessel or unexpected calcification. Skull:  Normal. Negative for fracture or focal lesion. Sinuses/Orbits: No acute finding. Other: Mild soft tissue swelling is noted over the left supraorbital region consistent with the recent injury. IMPRESSION: Mild subarachnoid hemorrhage in the medial aspect of the left frontal lobe. Chronic atrophic and ischemic changes. Left supraorbital soft tissue swelling consistent with the recent injury. Critical Value/emergent results were called by telephone at the time of interpretation on 07/11/2021 at 7:18 pm to Dr. Varney Biles , who verbally acknowledged these results. Electronically Signed   By: Inez Catalina M.D.   On: 07/11/2021 19:20   CT Cervical Spine Wo Contrast  Result Date: 07/11/2021 CLINICAL DATA:  Neck trauma.  Fall. EXAM: CT CERVICAL SPINE WITHOUT CONTRAST TECHNIQUE: Multidetector CT imaging of the cervical spine was performed without intravenous contrast. Multiplanar CT image reconstructions were also generated. COMPARISON:  Cervical spine CT 02/03/2021.  Is this FINDINGS: Alignment: There is mild chronic anterolisthesis at C2-C3 and C7-T1. Alignment is otherwise anatomic. Skull base and vertebrae: Visualized skull base is intact. No acute fractures. No focal osseous lesions. Soft tissues and spinal canal: No prevertebral fluid or swelling. No visible canal hematoma. Disc levels: There is intervertebral disc space narrowing throughout the cervical spine which is severe at C4-C5, C5-C6, C6-C7 and C7-T1. Findings are similar to the prior examination. There is no severe central canal or neural foraminal stenosis at any level. Upper chest: Negative. Other: None. IMPRESSION: 1. No acute fracture or traumatic subluxation. 2. Stable multilevel degenerative changes. Electronically Signed   By: Ronney Asters M.D.   On: 07/11/2021 20:20   DG Hip Unilat With Pelvis 2-3  Views Left  Result Date: 07/11/2021 CLINICAL DATA:  Recent fall with left hip pain, initial encounter EXAM: DG HIP (WITH OR WITHOUT PELVIS) 2-3V  LEFT COMPARISON:  02/03/2021 FINDINGS: Pelvic ring is intact. Postsurgical changes are noted in the lumbosacral spine as well as in the proximal left femur. There is been further progressive collapse of the left femoral head when compared with the prior exam. This is of uncertain chronicity but new from the prior exam of 02/03/2021. The surgically placed screws appear somewhat lucent when compared with the prior exam this is likely related to the head collapse. Mild degenerative changes of the hip joint are seen. IMPRESSION: Postsurgical changes in the proximal left femur. Progressive collapse of the left femoral head is noted with some mild displacement of previously placed surgical screws. The collapse of the femoral head is of uncertain chronicity Electronically Signed   By: Inez Catalina M.D.   On: 07/11/2021 19:33    Procedures .Critical Care Performed by: Varney Biles, MD Authorized by: Varney Biles, MD   Critical care provider statement:    Critical care time (minutes):  78   Critical care was necessary to treat or prevent imminent or life-threatening deterioration of the following conditions:  Trauma and CNS failure or compromise   Critical care was time spent personally by me on the following activities:  Discussions with consultants, evaluation of patient's response to treatment, examination of patient, ordering and performing treatments and interventions, ordering and review of laboratory studies, ordering and review of radiographic studies, pulse oximetry, re-evaluation of patient's condition, obtaining history from patient or surrogate and review of old charts   Medications Ordered in ED Medications  fentaNYL (SUBLIMAZE) injection 50 mcg (50 mcg Intravenous Given 07/11/21 1844)  acetaminophen (TYLENOL) tablet 650 mg (650 mg Oral Given 07/11/21 2200)    ED Course  I have reviewed the triage vital signs and the nursing notes.  Pertinent labs & imaging results that were available  during my care of the patient were reviewed by me and considered in my medical decision making (see chart for details).  Clinical Course as of 07/11/21 2219  Fri Jul 11, 2021  1956 CT HEAD WO CONTRAST CT reveals subarachnoid hemorrhage.  Patient's medication chart reviewed, there does not appear to be any anticoagulation on board. Neurosurgery has been consulted [AN]  [place] With Pelvis 2-3 Views Left X-ray shows displacement of the hardware.  Unknown chronicity.  It appears that patient was walking before Wednesday, therefore we suspected subacute.  We will try to find out where patient had procedure done. [AN]  1957 Platelets: 351 History of ITP, but platelets currently normal.  INR 0.9 [AN]  1957 Pt is ward of the state: [phone] Ms. Robina Ade is the primary Social Work Ms. Nicki Reaper is additional number [phone] [AN]  2008 Spoke with Dr. Trenton Gammon, neurosurgery. He has reviewed the imaging recommends no further repeat imaging or need for intervention or antiepileptics.  Subarachnoid is quite small. [AN]  2045 DG Hip Unilat With Pelvis 2-3 Views Left Spoke with Dr. Regino Bellow, orthopedics at [facility], and he recommended speaking with her neuro orthopedics who had completed the procedure earlier this year.  Spoke with gains, APP with Dr. Vinetta Bergamo.  They will be happy to follow-up with the patient in the clinic and decide on next best steps.  No weightbearing for now.  Patient will be discharged once her laceration has been repaired. [AN]    Clinical Course User Index [AN] Zadia Uhde,  MD   MDM Rules/Calculators/A&P                           87 year old female comes in with chief complaint of fall.  She has history of thrombocytopenia and at baseline able to ambulate at the nursing home.  Currently she is cooperative, follows commands and reports some discomfort over her mouth, forehead and indicates that she does tend to bleed heavily.  She has no C-spine tenderness on exam and no  discomfort with mobility of the neck.  I feel comfortable clearing her C-spine clinically.  CT head along with x-ray of the chest, left hip ordered.  Allegedly an x-ray was done at the facility and it showed some changes around the hardware.  Patient normally able to ambulate, but now having difficulty with active flexion of the hip.  Suspect that she might have had hip fracture.  Laceration will also need repair.  Final Clinical Impression(s) / ED Diagnoses Final diagnoses:  SAH (subarachnoid hemorrhage) ([place])  Osteonecrosis of hip with collapse of femoral head present on x-ray [facility])    Rx / DC Orders ED Discharge Orders     None        Varney Biles, MD 07/11/21 2219

## 2021-07-11 NOTE — ED Notes (Signed)
DSS called to update that Pt is a ward of the state from [facility]. Eino Farber [phone]...  And her supervisor Toula Moos  [phone] can be contacted if Consent is needed for any procedure or transfer.

## 2021-07-11 NOTE — ED Notes (Signed)
Dr Rudene Christians for [facility] paged to Dr Kathrynn Humble

## 2021-07-11 NOTE — Discharge Instructions (Addendum)
Ariana [name] was seen in the ER after she had a fall.  Her wound was repaired with Dermabond glue.  Avoid any kind of Vaseline or petroleum jelly around it. She also had skin tears that were repaired.  Ariana [name] was noted to have a small subarachnoid bleed on the CT scan.  Fortunately, the neurosurgeons did not expected to deteriorate and have recommended no further intervention.  Ariana [name] will require neuro monitoring every 4 hours for the next 48 hours -if there is any change in mental status, confusion, one-sided weakness, numbness, severe headaches please bring her back to the ER.  Additionally, her x-ray does indicate worsening of her hip.  She needs to be nonweightbearing and follow-up with Dr. Rudene Christians, the orthopedic surgeon for further repair.

## 2021-07-11 NOTE — ED Notes (Signed)
Patient transported to CT 

## 2021-07-11 NOTE — ED Notes (Signed)
ED Provider at bedside. 

## 2021-07-11 NOTE — Progress Notes (Signed)
87 year old female nursing home resident suffered a mechanical fall with significant blow to the head.  Unknown loss of consciousness.  Patient without current neurologic symptoms.  Neurologic exam is nonfocal.  Head CT scan demonstrates a minimal left frontal traumatic subarachnoid hemorrhage without evidence of significant cortical contusion.  Patient on no anticoagulation or antiplatelet therapy.  This patient does not require hospital admission for observation.  She may be safely sent back to her nursing home with instructions for return if symptoms develop.

## 2021-07-13 ENCOUNTER — Encounter (HOSPITAL_COMMUNITY): Payer: Self-pay | Admitting: Emergency Medicine

## 2021-07-13 ENCOUNTER — Other Ambulatory Visit: Payer: Self-pay

## 2021-07-13 ENCOUNTER — Inpatient Hospital Stay (HOSPITAL_COMMUNITY)
Admission: EM | Admit: 2021-07-13 | Discharge: 2021-07-15 | DRG: 083 | Disposition: A | Discharge status: Skilled Nursing Facility | Payer: Medicare HMO | Attending: Family Medicine | Admitting: Family Medicine

## 2021-07-13 ENCOUNTER — Emergency Department (HOSPITAL_COMMUNITY): Payer: Medicare HMO

## 2021-07-13 DIAGNOSIS — F419 Anxiety disorder, unspecified: Secondary | ICD-10-CM | POA: Diagnosis not present

## 2021-07-13 DIAGNOSIS — Z885 Allergy status to narcotic agent status: Secondary | ICD-10-CM | POA: Diagnosis not present

## 2021-07-13 DIAGNOSIS — I10 Essential (primary) hypertension: Secondary | ICD-10-CM | POA: Diagnosis present

## 2021-07-13 DIAGNOSIS — M810 Age-related osteoporosis without current pathological fracture: Secondary | ICD-10-CM | POA: Diagnosis not present

## 2021-07-13 DIAGNOSIS — G2581 Restless legs syndrome: Secondary | ICD-10-CM | POA: Diagnosis not present

## 2021-07-13 DIAGNOSIS — E7800 Pure hypercholesterolemia, unspecified: Secondary | ICD-10-CM | POA: Diagnosis not present

## 2021-07-13 DIAGNOSIS — D72829 Elevated white blood cell count, unspecified: Secondary | ICD-10-CM | POA: Diagnosis present

## 2021-07-13 DIAGNOSIS — R269 Unspecified abnormalities of gait and mobility: Secondary | ICD-10-CM | POA: Diagnosis present

## 2021-07-13 DIAGNOSIS — I609 Nontraumatic subarachnoid hemorrhage, unspecified: Secondary | ICD-10-CM | POA: Diagnosis not present

## 2021-07-13 DIAGNOSIS — F0280 Dementia in other diseases classified elsewhere without behavioral disturbance: Secondary | ICD-10-CM | POA: Diagnosis not present

## 2021-07-13 DIAGNOSIS — Z20822 Contact with and (suspected) exposure to covid-19: Secondary | ICD-10-CM | POA: Diagnosis not present

## 2021-07-13 DIAGNOSIS — F02818 Dementia in other diseases classified elsewhere, unspecified severity, with other behavioral disturbance: Secondary | ICD-10-CM | POA: Diagnosis not present

## 2021-07-13 DIAGNOSIS — D696 Thrombocytopenia, unspecified: Secondary | ICD-10-CM | POA: Diagnosis not present

## 2021-07-13 DIAGNOSIS — Z6820 Body mass index (BMI) 20.0-20.9, adult: Secondary | ICD-10-CM | POA: Diagnosis not present

## 2021-07-13 DIAGNOSIS — Y92129 Unspecified place in nursing home as the place of occurrence of the external cause: Secondary | ICD-10-CM | POA: Diagnosis not present

## 2021-07-13 DIAGNOSIS — S066XAA Traumatic subarachnoid hemorrhage with loss of consciousness status unknown, initial encounter: Secondary | ICD-10-CM | POA: Diagnosis not present

## 2021-07-13 DIAGNOSIS — I629 Nontraumatic intracranial hemorrhage, unspecified: Secondary | ICD-10-CM | POA: Diagnosis present

## 2021-07-13 DIAGNOSIS — I619 Nontraumatic intracerebral hemorrhage, unspecified: Secondary | ICD-10-CM | POA: Diagnosis present

## 2021-07-13 DIAGNOSIS — R41841 Cognitive communication deficit: Secondary | ICD-10-CM | POA: Diagnosis present

## 2021-07-13 DIAGNOSIS — Z66 Do not resuscitate: Secondary | ICD-10-CM | POA: Diagnosis present

## 2021-07-13 DIAGNOSIS — R404 Transient alteration of awareness: Secondary | ICD-10-CM | POA: Diagnosis not present

## 2021-07-13 DIAGNOSIS — F32A Depression, unspecified: Secondary | ICD-10-CM | POA: Diagnosis not present

## 2021-07-13 DIAGNOSIS — J449 Chronic obstructive pulmonary disease, unspecified: Secondary | ICD-10-CM | POA: Diagnosis present

## 2021-07-13 DIAGNOSIS — R4182 Altered mental status, unspecified: Secondary | ICD-10-CM

## 2021-07-13 DIAGNOSIS — Z96612 Presence of left artificial shoulder joint: Secondary | ICD-10-CM | POA: Diagnosis not present

## 2021-07-13 DIAGNOSIS — Z823 Family history of stroke: Secondary | ICD-10-CM

## 2021-07-13 DIAGNOSIS — E876 Hypokalemia: Secondary | ICD-10-CM | POA: Diagnosis not present

## 2021-07-13 DIAGNOSIS — G932 Benign intracranial hypertension: Secondary | ICD-10-CM | POA: Diagnosis present

## 2021-07-13 DIAGNOSIS — W1830XA Fall on same level, unspecified, initial encounter: Secondary | ICD-10-CM | POA: Diagnosis present

## 2021-07-13 DIAGNOSIS — Z28310 Unvaccinated for covid-19: Secondary | ICD-10-CM | POA: Diagnosis not present

## 2021-07-13 DIAGNOSIS — E039 Hypothyroidism, unspecified: Secondary | ICD-10-CM | POA: Diagnosis present

## 2021-07-13 DIAGNOSIS — K219 Gastro-esophageal reflux disease without esophagitis: Secondary | ICD-10-CM | POA: Diagnosis present

## 2021-07-13 DIAGNOSIS — Z79890 Hormone replacement therapy (postmenopausal): Secondary | ICD-10-CM

## 2021-07-13 DIAGNOSIS — E440 Moderate protein-calorie malnutrition: Secondary | ICD-10-CM | POA: Diagnosis present

## 2021-07-13 DIAGNOSIS — G301 Alzheimer's disease with late onset: Secondary | ICD-10-CM | POA: Diagnosis present

## 2021-07-13 DIAGNOSIS — Z888 Allergy status to other drugs, medicaments and biological substances status: Secondary | ICD-10-CM

## 2021-07-13 DIAGNOSIS — Z79899 Other long term (current) drug therapy: Secondary | ICD-10-CM

## 2021-07-13 LAB — BASIC METABOLIC PANEL
Anion gap: 11 (ref 5–15)
BUN: 41 mg/dL — ABNORMAL HIGH (ref 8–23)
CO2: 27 mmol/L (ref 22–32)
Calcium: 9.4 mg/dL (ref 8.9–10.3)
Chloride: 102 mmol/L (ref 98–111)
Creatinine, Ser: 1.13 mg/dL — ABNORMAL HIGH (ref 0.44–1.00)
GFR, Estimated: 48 mL/min — ABNORMAL LOW (ref >60)
Glucose, Bld: 107 mg/dL — ABNORMAL HIGH (ref 70–99)
Potassium: 3.3 mmol/L — ABNORMAL LOW (ref 3.5–5.1)
Sodium: 140 mmol/L (ref 135–145)

## 2021-07-13 LAB — RESP PANEL BY RT-PCR (FLU A&B, COVID) ARPGX2
Influenza A by PCR: NEGATIVE
Influenza B by PCR: NEGATIVE
SARS Coronavirus 2 by RT PCR: NEGATIVE

## 2021-07-13 LAB — CBC WITH DIFFERENTIAL/PLATELET
Abs Immature Granulocytes: 0.08 10*3/uL — ABNORMAL HIGH (ref 0.00–0.07)
Basophils Absolute: 0.1 10*3/uL (ref 0.0–0.1)
Basophils Relative: 0 %
Eosinophils Absolute: 0.2 10*3/uL (ref 0.0–0.5)
Eosinophils Relative: 1 %
HCT: 44.7 % (ref 36.0–46.0)
Hemoglobin: 14.6 g/dL (ref 12.0–15.0)
Immature Granulocytes: 0 %
Lymphocytes Relative: 12 %
Lymphs Abs: 2.4 10*3/uL (ref 0.7–4.0)
MCH: 30.4 pg (ref 26.0–34.0)
MCHC: 32.7 g/dL (ref 30.0–36.0)
MCV: 92.9 fL (ref 80.0–100.0)
Monocytes Absolute: 1.4 10*3/uL — ABNORMAL HIGH (ref 0.1–1.0)
Monocytes Relative: 7 %
Neutro Abs: 16 10*3/uL — ABNORMAL HIGH (ref 1.7–7.7)
Neutrophils Relative %: 80 %
Platelets: 298 10*3/uL (ref 150–400)
RBC: 4.81 MIL/uL (ref 3.87–5.11)
RDW: 18.1 % — ABNORMAL HIGH (ref 11.5–15.5)
WBC: 20.2 10*3/uL — ABNORMAL HIGH (ref 4.0–10.5)
nRBC: 0 % (ref 0.0–0.2)

## 2021-07-13 LAB — URINALYSIS, ROUTINE W REFLEX MICROSCOPIC
Bilirubin Urine: NEGATIVE
Glucose, UA: NEGATIVE mg/dL
Ketones, ur: 5 mg/dL — AB
Nitrite: NEGATIVE
Protein, ur: 30 mg/dL — AB
Specific Gravity, Urine: 1.025 (ref 1.005–1.030)
pH: 5 (ref 5.0–8.0)

## 2021-07-13 MED ORDER — LAMOTRIGINE 25 MG PO TABS
50.0000 mg | ORAL_TABLET | Freq: Every day | ORAL | Status: DC
  Administered 2021-07-13 – 2021-07-14 (×2): 50 mg via ORAL
  Filled 2021-07-13 (×3): qty 2

## 2021-07-13 MED ORDER — OXYBUTYNIN CHLORIDE 5 MG PO TABS
5.0000 mg | ORAL_TABLET | Freq: Three times a day (TID) | ORAL | Status: DC
  Administered 2021-07-13 – 2021-07-15 (×6): 5 mg via ORAL
  Filled 2021-07-13 (×7): qty 1

## 2021-07-13 MED ORDER — CITALOPRAM HYDROBROMIDE 10 MG PO TABS
20.0000 mg | ORAL_TABLET | ORAL | Status: DC
  Administered 2021-07-14 – 2021-07-15 (×2): 20 mg via ORAL
  Filled 2021-07-13 (×2): qty 2

## 2021-07-13 MED ORDER — MEMANTINE HCL 10 MG PO TABS
10.0000 mg | ORAL_TABLET | Freq: Two times a day (BID) | ORAL | Status: DC
  Administered 2021-07-13 – 2021-07-15 (×4): 10 mg via ORAL
  Filled 2021-07-13 (×5): qty 1

## 2021-07-13 MED ORDER — PANTOPRAZOLE SODIUM 20 MG PO TBEC
20.0000 mg | DELAYED_RELEASE_TABLET | Freq: Two times a day (BID) | ORAL | Status: DC
  Administered 2021-07-13: 20 mg via ORAL
  Filled 2021-07-13 (×3): qty 1

## 2021-07-13 MED ORDER — GABAPENTIN 100 MG PO CAPS
200.0000 mg | ORAL_CAPSULE | Freq: Four times a day (QID) | ORAL | Status: DC
  Administered 2021-07-13 – 2021-07-15 (×8): 200 mg via ORAL
  Filled 2021-07-13 (×9): qty 2

## 2021-07-13 MED ORDER — IPRATROPIUM-ALBUTEROL 0.5-2.5 (3) MG/3ML IN SOLN: 3.0000 mL | Freq: Four times a day (QID) | RESPIRATORY_TRACT | Status: DC | PRN

## 2021-07-13 MED ORDER — BUSPIRONE HCL 15 MG PO TABS
7.5000 mg | ORAL_TABLET | Freq: Two times a day (BID) | ORAL | Status: DC
  Administered 2021-07-13 – 2021-07-15 (×4): 7.5 mg via ORAL
  Filled 2021-07-13 (×5): qty 1

## 2021-07-13 MED ORDER — MONTELUKAST SODIUM 10 MG PO TABS
10.0000 mg | ORAL_TABLET | Freq: Every day | ORAL | Status: DC
  Administered 2021-07-13 – 2021-07-14 (×2): 10 mg via ORAL
  Filled 2021-07-13 (×3): qty 1

## 2021-07-13 MED ORDER — ONDANSETRON HCL 4 MG/2ML IJ SOLN
4.0000 mg | Freq: Four times a day (QID) | INTRAMUSCULAR | Status: DC | PRN
  Administered 2021-07-13 – 2021-07-14 (×2): 4 mg via INTRAVENOUS
  Filled 2021-07-13 (×2): qty 2

## 2021-07-13 MED ORDER — FLUTICASONE PROPIONATE 50 MCG/ACT NA SUSP
2.0000 | Freq: Every day | NASAL | Status: DC | PRN
  Administered 2021-07-15: 2 via NASAL
  Filled 2021-07-13: qty 16

## 2021-07-13 MED ORDER — POTASSIUM CHLORIDE IN NACL 20-0.9 MEQ/L-% IV SOLN
INTRAVENOUS | Status: AC
  Filled 2021-07-13 (×2): qty 1000

## 2021-07-13 MED ORDER — LEVOTHYROXINE SODIUM 50 MCG PO TABS
50.0000 ug | ORAL_TABLET | Freq: Every day | ORAL | Status: DC
  Administered 2021-07-14 – 2021-07-15 (×2): 50 ug via ORAL
  Filled 2021-07-13 (×2): qty 1

## 2021-07-13 MED ORDER — FLUTICASONE FUROATE-VILANTEROL 200-25 MCG/INH IN AEPB
1.0000 | INHALATION_SPRAY | Freq: Every day | RESPIRATORY_TRACT | Status: DC
  Administered 2021-07-14 – 2021-07-15 (×2): 1 via RESPIRATORY_TRACT
  Filled 2021-07-13: qty 28

## 2021-07-13 MED ORDER — METOCLOPRAMIDE HCL 5 MG/ML IJ SOLN
5.0000 mg | Freq: Three times a day (TID) | INTRAMUSCULAR | Status: DC | PRN
  Administered 2021-07-14: 5 mg via INTRAVENOUS
  Filled 2021-07-13: qty 2

## 2021-07-13 MED ORDER — AMLODIPINE BESYLATE 10 MG PO TABS
10.0000 mg | ORAL_TABLET | Freq: Every day | ORAL | Status: DC
  Administered 2021-07-14 – 2021-07-15 (×2): 10 mg via ORAL
  Filled 2021-07-13 (×2): qty 1

## 2021-07-13 MED ORDER — HYDRALAZINE HCL 20 MG/ML IJ SOLN: 10.0000 mg | Freq: Three times a day (TID) | INTRAMUSCULAR | Status: DC | PRN

## 2021-07-13 MED ORDER — ALPRAZOLAM 0.5 MG PO TABS
0.5000 mg | ORAL_TABLET | Freq: Three times a day (TID) | ORAL | Status: DC | PRN
  Administered 2021-07-14 – 2021-07-15 (×2): 0.5 mg via ORAL
  Filled 2021-07-13 (×3): qty 1

## 2021-07-13 MED ORDER — ACETAMINOPHEN 325 MG PO TABS
650.0000 mg | ORAL_TABLET | Freq: Four times a day (QID) | ORAL | Status: DC | PRN
  Administered 2021-07-14: 650 mg via ORAL
  Filled 2021-07-13: qty 2

## 2021-07-13 NOTE — ED Notes (Signed)
Held Pts PO meds d/t pt having emesis. MD made aware. MD ordered IV BP meds for as needed. Pts pressures are 450-388E systolic and stable currently. Will continue to monitor pt.

## 2021-07-13 NOTE — ED Triage Notes (Signed)
Pt arrived via [facility] from [facility]. Pt has had multiple falls this past week and nursing staff states pt is not acting her normal self. Pt can usually feed her self and walk but is unable to do those things currently. Per EMS pt can follow commands and walked to stretcher with assistance.

## 2021-07-13 NOTE — ED Notes (Signed)
Pt unable to sign MSE d/t no signature pad

## 2021-07-13 NOTE — H&P (Signed)
History and Physical    Ariana [name] YYF:[id number] DOB: 01/30/1933 DOA: 07/13/2021  PCP: Caprice Renshaw, MD   Patient coming from: [place]  I have personally briefly reviewed patient's old medical records in [place]  Chief Complaint: Worsening changes in mentation; nausea/vomiting.  HPI: Ariana [name] is a 87 y.o. female with medical history significant of ITP, asthma/COPD, Alzheimer disease with behavioral disturbances, gastroesophageal reflux disease, hypertension, hypercholesterolemia, hypothyroidism, chronic kidney disease a stage IIIb and recent mechanical fall with involvement of head trauma; who presented to the emergency department secondary to nausea/vomiting and worsening changes in her mentation.  Skilled nursing facility where patient lives reported that for the last 24 hours patient has demonstrated worsening in her mentation and inability to perform activities of daily living that she was capable of prior to her recent mechanical fall.  On 07/11/2021 patient ended experiencing a mechanical fall at the facility where she hit her head.  Images demonstrated small subarachnoid and felt to be safe to return without any further intervention or observation.  In the setting of worsening symptoms patient came back to the hospital for further evaluation.  Outside nausea vomiting and changes in her mentation, there has not been any seizure, fever, chills, coughing spells, dysuria, hematuria, melena, hematochezia, complaints of chest pain, abdominal pain or focal weakness.  COVID PCR ordered and pending currently; patient is not vaccinated.  ED Course: CT scan of the head demonstrating enlargement of previously seen subarachnoid hemorrhage; no mass-effect, no midline shift.  Case discussed with neurosurgery who has recommended transfer to [facility] under hospitalist service and they will see patient in consultation.  Mild hypokalemia potassium 3.3.  The rest  of her blood work appears to be stable and at baseline.  Review of Systems: As per HPI otherwise all other systems reviewed and are negative.   Past Medical History:  Diagnosis Date   Alzheimer disease ([place])    Anemia    Anxiety    Asthma    WELL CONTROLLED   Chronic back pain    Cognitive communication deficit    COPD (chronic obstructive pulmonary disease) (HCC)    Depression    Depression    GERD (gastroesophageal reflux disease)    Hiatal hernia    Hypercholesteremia    Hypothyroidism    Iron deficiency    ITP (idiopathic thrombocytopenic purpura)    FOLLOWED BR DR Grayland Ormond   LBBB (left bundle branch block)    Osteoarthritis    Osteoporosis    Restless legs     Past Surgical History:  Procedure Laterality Date   CARPAL TUNNEL RELEASE     ESOPHAGOGASTRODUODENOSCOPY (EGD) WITH PROPOFOL N/A 09/10/2015   Procedure: ESOPHAGOGASTRODUODENOSCOPY (EGD) WITH PROPOFOL;  Surgeon: Lollie Sails, MD;  Location: [facility] ENDOSCOPY;  Service: Endoscopy;  Laterality: N/A;   HIP PINNING,CANNULATED Left 10/31/2020   Procedure: CANNULATED HIP PINNING;  Surgeon: Leim Fabry, MD;  Location: [facility] ORS;  Service: Orthopedics;  Laterality: Left;   IRRIGATION AND DEBRIDEMENT HEMATOMA Right 07/13/2018   Procedure: IRRIGATION AND DEBRIDEMENT HEMATOMA-RIGHT SHIN;  Surgeon: Herbert Pun, MD;  Location: [facility] ORS;  Service: General;  Laterality: Right;   KYPHOPLASTY N/A 05/02/2019   Procedure: L1 KYPHOPLASTY;  Surgeon: Hessie Knows, MD;  Location: [facility] ORS;  Service: Orthopedics;  Laterality: N/A;   LUMBAR DISC SURGERY     REVERSE SHOULDER ARTHROPLASTY Left 06/25/2020   Procedure: REVERSE SHOULDER ARTHROPLASTY;  Surgeon: Corky Mull, MD;  Location: [facility] ORS;  Service: Orthopedics;  Laterality: Left;   SPLENECTOMY, PARTIAL     TOTAL ABDOMINAL HYSTERECTOMY      Social History  reports that she has never smoked. She has never used smokeless tobacco. She reports that she does not drink  alcohol and does not use drugs.  Allergies  Allergen Reactions   Aspirin     Upset stomach   Diazepam Itching    sneezing   Other     seasonal allergies   Tetanus Toxoids     Localized superficial swelling of skin   Morphine Itching and Rash    Family History  Problem Relation Age of Onset   Stroke Mother     Prior to Admission medications   Medication Sig Start Date End Date Taking? Authorizing Provider  acetaminophen (TYLENOL) 325 MG tablet Take 2 tablets (650 mg total) by mouth every 6 (six) hours as needed for mild pain (or Fever >/= 101). 07/02/15   Idelle Crouch, MD  albuterol (PROVENTIL HFA;VENTOLIN HFA) 108 (90 BASE) MCG/ACT inhaler Inhale 2 puffs into the lungs every 6 (six) hours as needed for wheezing or shortness of breath.    [provider]  ALPRAZolam Duanne Moron) 0.5 MG tablet Take 1 tablet (0.5 mg total) by mouth 3 (three) times daily as needed for anxiety. 02/06/21   Johnson, Clanford L, MD  amLODipine (NORVASC) 10 MG tablet Take 1 tablet (10 mg total) by mouth daily. 01/17/21   Ezekiel Slocumb, DO  busPIRone (BUSPAR) 7.5 MG tablet Take 1 tablet (7.5 mg total) by mouth 2 (two) times daily. 01/17/21   Ezekiel Slocumb, DO  cholecalciferol (VITAMIN D3) 25 MCG (1000 UNIT) tablet Take 2,000 Units by mouth daily.    [provider]  citalopram (CELEXA) 20 MG tablet Take 1 tablet (20 mg total) by mouth every morning. 02/06/21   Johnson, Clanford L, MD  Cyanocobalamin 1000 MCG TBCR Take 1,000 mcg by mouth daily.     [provider]  fluticasone (FLONASE) 50 MCG/ACT nasal spray Place 2 sprays into both nostrils daily as needed for allergies. 03/03/17   [provider]  Fluticasone-Salmeterol (ADVAIR) 250-50 MCG/DOSE AEPB Inhale 1 puff into the lungs 2 (two) times daily as needed (shortness of breath).     [provider]  gabapentin (NEURONTIN) 100 MG capsule Take 2 capsules (200 mg total) by mouth 4 (four) times daily. 02/06/21    Johnson, Clanford L, MD  iron polysaccharides (NIFEREX) 150 MG capsule Take 1 capsule (150 mg total) by mouth daily. 01/17/21   Ezekiel Slocumb, DO  lamoTRIgine (LAMICTAL) 25 MG tablet Take 50 mg by mouth at bedtime. 08/05/20   [provider]  levothyroxine (SYNTHROID, LEVOTHROID) 50 MCG tablet Take 50 mcg by mouth daily before breakfast. Take 30 to 60 minutes before breakfast.    [provider]  loratadine (CLARITIN) 10 MG tablet Take 1 tablet (10 mg total) by mouth daily. 01/17/21   Ezekiel Slocumb, DO  memantine (NAMENDA) 10 MG tablet Take 10 mg by mouth 2 (two) times daily.    [provider]  montelukast (SINGULAIR) 10 MG tablet Take 10 mg by mouth at bedtime.    [provider]  Multiple Vitamin (MULTI-VITAMIN) tablet Take 1 tablet by mouth daily.    [provider]  Multiple Vitamins-Minerals (PRESERVISION AREDS 2) CAPS Take 1 capsule by mouth 2 (two) times a day.    [provider]  ondansetron (ZOFRAN) 4 MG tablet Take 1 tablet (4 mg total) by  mouth every 6 (six) hours as needed for nausea. 01/17/21   Ezekiel Slocumb, DO  oxybutynin (DITROPAN) 5 MG tablet Take 5 mg by mouth 3 (three) times daily.    [provider]  pantoprazole (PROTONIX) 20 MG tablet Take 20 mg by mouth 2 (two) times daily.  07/24/15   [provider]  QUEtiapine (SEROQUEL) 25 MG tablet Take 0.5 tablets (12.5 mg total) by mouth every 6 (six) hours as needed (agitation). 01/17/21   Nicole Kindred A, DO  rosuvastatin (CRESTOR) 5 MG tablet Take 5 mg by mouth daily.    [provider]  vitamin E 180 MG (400 UNITS) capsule Take 400 Units by mouth daily.    [provider]    Physical Exam: Vitals:   07/13/21 0954 07/13/21 1030 07/13/21 1130 07/13/21 1200  BP:  124/67 111/68 121/77  Pulse:  83 88 88  Resp:  12 17 16   Temp:      TempSrc:      SpO2:  96% 95% 94%  Weight: 60 kg     Height: 5\' 7"  (1.702 m)       Constitutional:  In no major distress currently; denies chest pain, abdominal pain, dysuria and is afebrile. Vitals:   07/13/21 0954 07/13/21 1030 07/13/21 1130 07/13/21 1200  BP:  124/67 111/68 121/77  Pulse:  83 88 88  Resp:  12 17 16   Temp:      TempSrc:      SpO2:  96% 95% 94%  Weight: 60 kg     Height: 5\' 7"  (1.702 m)      Eyes: PERRL, lids and conjunctivae normal, no icterus, no nystagmus. ENMT: Mucous membranes are moist. Posterior pharynx clear of any exudate or lesions. Neck: normal, supple, no masses, no thyromegaly, no JVD. Respiratory: clear to auscultation bilaterally, no wheezing, no crackles. Normal respiratory effort. No accessory muscle use.  Good saturation on room air. Cardiovascular: Regular rate and rhythm, no murmurs / rubs / gallops. No extremity edema. 2+ pedal pulses. No carotid bruits.  Abdomen: no tenderness, no masses palpated. No hepatosplenomegaly. Bowel sounds positive.  Musculoskeletal: no clubbing / cyanosis.  Normal muscle tone. Skin: Left side of her face is bruised following recent mechanical fall and trauma; no open wounds.  No petechiae. Neurologic: CN grossly intact; moving 4 limbs spontaneously.  No focal deficits.  Exam is limited secondary to underlying dementia and new level confusion. Psychiatric: Mood appears to be stable currently; judgment and insight impaired secondary to dementia.   Labs on Admission: I have personally reviewed following labs and imaging studies  CBC: Recent Labs  Lab 07/11/21 1840 07/13/21 1011  WBC 15.6* 20.2*  NEUTROABS 10.9* 16.0*  HGB 14.4 14.6  HCT 43.8 44.7  MCV 92.0 92.9  PLT 351 992    Basic Metabolic Panel: Recent Labs  Lab 07/11/21 1840 07/13/21 1011  NA 141 140  K 4.0 3.3*  CL 104 102  CO2 27 27  GLUCOSE 87 107*  BUN 27* 41*  CREATININE 0.86 1.13*  CALCIUM 9.4 9.4    GFR: Estimated Creatinine Clearance: 34.5 mL/min (A) (by C-G formula based on SCr of 1.13 mg/dL (H)).  Liver Function Tests: Recent  Labs  Lab 07/11/21 1840  AST 29  ALT 27  ALKPHOS 152*  BILITOT 0.5  PROT 7.4  ALBUMIN 4.3    Urine analysis:    Component Value Date/Time   COLORURINE AMBER (A) 07/13/2021 1011   APPEARANCEUR CLOUDY (A) 07/13/2021 1011  APPEARANCEUR Clear 09/03/2014 1200   LABSPEC 1.025 07/13/2021 1011   LABSPEC 1.018 09/03/2014 1200   PHURINE 5.0 07/13/2021 1011   GLUCOSEU NEGATIVE 07/13/2021 1011   GLUCOSEU Negative 09/03/2014 1200   HGBUR SMALL (A) 07/13/2021 1011   BILIRUBINUR NEGATIVE 07/13/2021 1011   BILIRUBINUR Negative 09/03/2014 1200   KETONESUR 5 (A) 07/13/2021 1011   PROTEINUR 30 (A) 07/13/2021 1011   NITRITE NEGATIVE 07/13/2021 1011   LEUKOCYTESUR SMALL (A) 07/13/2021 1011   LEUKOCYTESUR Negative 09/03/2014 1200    Radiological Exams on Admission: DG Chest 1 View  Result Date: 07/11/2021 CLINICAL DATA:  Recent fall with left-sided pain, initial encounter EXAM: PORTABLE CHEST 1 VIEW COMPARISON:  05/04/2021 FINDINGS: Cardiac shadow is mildly prominent but stable. Aortic calcifications are noted. The lungs are well aerated bilaterally. No focal infiltrate or sizable effusion is seen. Postsurgical changes in the left shoulder are noted. No acute bony abnormality is seen. IMPRESSION: No acute abnormality noted. Electronically Signed   By: Inez Catalina M.D.   On: 07/11/2021 19:31   CT Head Wo Contrast  Result Date: 07/13/2021 CLINICAL DATA:  Mental status change.  Recent hemorrhage EXAM: CT HEAD WITHOUT CONTRAST TECHNIQUE: Contiguous axial images were obtained from the base of the skull through the vertex without intravenous contrast. COMPARISON:  07/11/2021 FINDINGS: Brain: Larger anterior and parasagittal left frontal hematoma along the surface of the brain which measures 20 x 21 x 3.6 cm. Location appears intraparenchymal, although on prior there was clearly a subarachnoid appearance. No adjacent edema or significant mass effect in the setting of atrophy. Chronic small vessel  ischemia in the hemispheric white matter which is age congruent. No visible cortical infarct. Reportedly there was a hemorrhage precipitating the initial bleed. Vascular: Atheromatous calcification Skull: Negative Sinuses/Orbits: No visible injury.  Bilateral cataract resection. Other: Critical Value/emergent results were called by telephone at the time of interpretation on 07/13/2021 at 10:58 am to provider [facility] , who verbally acknowledged these results. IMPRESSION: Larger and now 7 cc left frontal parasagittal hemorrhage which appears both parenchymal and subarachnoid. No edema or significant mass effect. Electronically Signed   By: Jorje Guild M.D.   On: 07/13/2021 10:59   CT HEAD WO CONTRAST  Result Date: 07/11/2021 CLINICAL DATA:  Recent fall with facial swelling, initial encounter EXAM: CT HEAD WITHOUT CONTRAST TECHNIQUE: Contiguous axial images were obtained from the base of the skull through the vertex without intravenous contrast. COMPARISON:  02/03/2021 FINDINGS: Brain: Chronic atrophic and ischemic changes are again identified. There is minimal subarachnoid hemorrhage along the medial aspect of the left frontal lobe best seen on images 16 through 18 of series 2. No other focal area of hemorrhage is identified. Vascular: No hyperdense vessel or unexpected calcification. Skull: Normal. Negative for fracture or focal lesion. Sinuses/Orbits: No acute finding. Other: Mild soft tissue swelling is noted over the left supraorbital region consistent with the recent injury. IMPRESSION: Mild subarachnoid hemorrhage in the medial aspect of the left frontal lobe. Chronic atrophic and ischemic changes. Left supraorbital soft tissue swelling consistent with the recent injury. Critical Value/emergent results were called by telephone at the time of interpretation on 07/11/2021 at 7:18 pm to Dr. Varney Biles , who verbally acknowledged these results. Electronically Signed   By: Inez Catalina M.D.   On:  07/11/2021 19:20   CT Cervical Spine Wo Contrast  Result Date: 07/11/2021 CLINICAL DATA:  Neck trauma.  Fall. EXAM: CT CERVICAL SPINE WITHOUT CONTRAST TECHNIQUE: Multidetector CT imaging of the cervical spine  was performed without intravenous contrast. Multiplanar CT image reconstructions were also generated. COMPARISON:  Cervical spine CT 02/03/2021.  Is this FINDINGS: Alignment: There is mild chronic anterolisthesis at C2-C3 and C7-T1. Alignment is otherwise anatomic. Skull base and vertebrae: Visualized skull base is intact. No acute fractures. No focal osseous lesions. Soft tissues and spinal canal: No prevertebral fluid or swelling. No visible canal hematoma. Disc levels: There is intervertebral disc space narrowing throughout the cervical spine which is severe at C4-C5, C5-C6, C6-C7 and C7-T1. Findings are similar to the prior examination. There is no severe central canal or neural foraminal stenosis at any level. Upper chest: Negative. Other: None. IMPRESSION: 1. No acute fracture or traumatic subluxation. 2. Stable multilevel degenerative changes. Electronically Signed   By: Ronney Asters M.D.   On: 07/11/2021 20:20   DG Hip Unilat With Pelvis 2-3 Views Left  Result Date: 07/11/2021 CLINICAL DATA:  Recent fall with left hip pain, initial encounter EXAM: DG HIP (WITH OR WITHOUT PELVIS) 2-3V LEFT COMPARISON:  02/03/2021 FINDINGS: Pelvic ring is intact. Postsurgical changes are noted in the lumbosacral spine as well as in the proximal left femur. There is been further progressive collapse of the left femoral head when compared with the prior exam. This is of uncertain chronicity but new from the prior exam of 02/03/2021. The surgically placed screws appear somewhat lucent when compared with the prior exam this is likely related to the head collapse. Mild degenerative changes of the hip joint are seen. IMPRESSION: Postsurgical changes in the proximal left femur. Progressive collapse of the left femoral  head is noted with some mild displacement of previously placed surgical screws. The collapse of the femoral head is of uncertain chronicity Electronically Signed   By: Inez Catalina M.D.   On: 07/11/2021 19:33    EKG: Independently reviewed.  Sinus rhythm; no ischemic changes.  Assessment/Plan 1-SAH (subarachnoid hemorrhage) (HCC) -With CT scan demonstrating large hemorrhagic from 07/11/2021. -Patient [place] in the setting of recent mechanical fall with head trauma. -Per facility reports she is more confused than her baseline and is having trouble following simple commands and doing activities that otherwise she was capable of doing -No signs of acute infection appreciated -No focal deficits. -Case discussed with neurosurgery who has recommended admission to [facility] for further evaluation and management. (Dr. Annette Stable will see patient in consultation) -Q4 hours neurochecks. -constant reorientation and supportive care  2-N/V -etiology unclear currently -?? Due to initial increased in her intracranial pressure -no mass effect, edema or midline shift -will provide CLD, PRN zofran and if needed IV reglan for refractory symptoms.  3-GERD -continue PPI  4-Acquired hypothyroidism -continue synthroid   5-Late onset Alzheimer's disease with behavioral disturbance ([place]) -at risk for delirium and sundowning  -provide constant reorientation -continue the use of Namenda, Lamictal, Celexa, BuSpar and as needed Xanax  6-Thrombocytopenia ([place]) -With history of ITP -Platelets count with normal level currently -Continue to follow trend -Continue avoiding heparin products -SCDs for DVT prophylaxis.  7-Malnutrition of moderate degree -In the setting of chronic illness and underlying dementia. -1 fully able to tolerate p.o.'s we will recommend the use of feeding supplements.  8-history of asthma/COPD -Stable breathing and no wheezing currently -No wheezing -Continue home nebulizer/inhaler  management and the use of Singulair.  9-leukocytosis -No acute source of infection appreciated -Most likely stress demargination -Fluid resuscitation will be provided -Follow WBCs trend.  10-hypokalemia -Will Replete electrolytes and follow stability.  DVT prophylaxis: SCDs Code Status:  DNR Family Communication:  No family at bedside.  Patient is a ward of state. Disposition Plan:   Patient is from:  Skilled nursing facility  Anticipated DC to:  Skilled nursing facility  Anticipated DC date:  To be determined; hopefully less than 2 midnights.  Anticipated DC barriers: Further stabilization from recent subarachnoid hemorrhagic following head trauma and mechanical fall.  Will follow recommendations by neurosurgery.  Consults called:  Neurosurgery [facility]). Admission status:  Observation, telemetry bed, length of stay less than 2 midnights.  Severity of Illness: The appropriate patient status for this patient is OBSERVATION. Observation status is judged to be reasonable and necessary in order to provide the required intensity of service to ensure the patient's safety. The patient's presenting symptoms, physical exam findings, and initial radiographic and laboratory data in the context of their medical condition is felt to place them at decreased risk for further clinical deterioration. Furthermore, it is anticipated that the patient will be medically stable for discharge from the hospital within 2 midnights of admission. The following factors support the patient status of observation.   " The patient's presenting symptoms include worsening mentation, nausea/vomiting. " The physical exam findings include active vomiting events; patient is more confused and having difficulty performing her simple activities of daily living as previously reported being able to do. " The initial radiographic and laboratory data are CT scan of the head demonstrating worsening subarachnoid hemorrhagic; case  discussed with neurosurgery who recommended admission at [facility] for further evaluation and management.Barton Dubois MD [facility]  How to contact the [facility] Attending or Consulting provider [place] or covering provider during after hours [place], for this patient?   Check the care team in [facility] and look for a) attending/consulting [facility] provider listed and b) the [facility] team listed Log into [email or web address] and use [place]'s universal password to access. If you do not have the password, please contact the hospital operator. Locate the [facility] provider you are looking for under [facility] and page to a number that you can be directly reached. If you still have difficulty reaching the provider, please page the [facility] (Director on Call) for the Hospitalists listed on amion for assistance.  07/13/2021, 4:00 PM

## 2021-07-13 NOTE — Progress Notes (Signed)
Patient arrived in the unit at 1945 pm from [place], stable, A&Ox1, initiated telemetry monitor , resting in a BED, Vts are within  normal range, and will continue to monitor.

## 2021-07-13 NOTE — ED Notes (Signed)
Nursing report from [facility] states pt can not follow commands and started to vomit last night.  Pt is following commands for nursing staff and no vomiting noted.

## 2021-07-13 NOTE — ED Notes (Signed)
Put pt on 12 lead monitor

## 2021-07-13 NOTE — ED Notes (Signed)
Dr.Madera at patients bedside

## 2021-07-13 NOTE — ED Notes (Signed)
Pt vomited on her self. Pt & bed cleaned. Emesis bag provided. Will continue to monitor.

## 2021-07-13 NOTE — ED Provider Notes (Signed)
[facility] EMERGENCY DEPARTMENT Provider Note  CSN: [id number] Arrival date & time: 07/13/21 0017    History Chief Complaint  Patient presents with   Altered Mental Status    Ariana [name] is a 87 y.o. female with history of dementia brought to the ED via EMS from SNF for AMS. She was recently in the ED on 9/30 after falling, found to have a small SAH not in need of intervention, and chronic appearing disruption of her prior L hip hardware. She was ultimately discharged back to the facility. This morning, staff at SNF reports she was 'a little off' today but today she was not as independent as usual this morning. Not eating like usual, usually will feed herself but has not been able to help with ADLs per her usual. She had reportedly low BP and some vomiting this morning. No reported fever. Patient unable to give any useful history. She is a ward of the state, no family.    Past Medical History:  Diagnosis Date   Alzheimer disease ([place])    Anemia    Anxiety    Asthma    WELL CONTROLLED   Chronic back pain    Cognitive communication deficit    COPD (chronic obstructive pulmonary disease) (HCC)    Depression    Depression    GERD (gastroesophageal reflux disease)    Hiatal hernia    Hypercholesteremia    Hypothyroidism    Iron deficiency    ITP (idiopathic thrombocytopenic purpura)    FOLLOWED BR DR Grayland Ormond   LBBB (left bundle branch block)    Osteoarthritis    Osteoporosis    Restless legs     Past Surgical History:  Procedure Laterality Date   CARPAL TUNNEL RELEASE     ESOPHAGOGASTRODUODENOSCOPY (EGD) WITH PROPOFOL N/A 09/10/2015   Procedure: ESOPHAGOGASTRODUODENOSCOPY (EGD) WITH PROPOFOL;  Surgeon: Lollie Sails, MD;  Location: [facility] ENDOSCOPY;  Service: Endoscopy;  Laterality: N/A;   HIP PINNING,CANNULATED Left 10/31/2020   Procedure: CANNULATED HIP PINNING;  Surgeon: Leim Fabry, MD;  Location: [facility] ORS;  Service: Orthopedics;  Laterality: Left;    IRRIGATION AND DEBRIDEMENT HEMATOMA Right 07/13/2018   Procedure: IRRIGATION AND DEBRIDEMENT HEMATOMA-RIGHT SHIN;  Surgeon: Herbert Pun, MD;  Location: [facility] ORS;  Service: General;  Laterality: Right;   KYPHOPLASTY N/A 05/02/2019   Procedure: L1 KYPHOPLASTY;  Surgeon: Hessie Knows, MD;  Location: [facility] ORS;  Service: Orthopedics;  Laterality: N/A;   LUMBAR DISC SURGERY     REVERSE SHOULDER ARTHROPLASTY Left 06/25/2020   Procedure: REVERSE SHOULDER ARTHROPLASTY;  Surgeon: Corky Mull, MD;  Location: [facility] ORS;  Service: Orthopedics;  Laterality: Left;   SPLENECTOMY, PARTIAL     TOTAL ABDOMINAL HYSTERECTOMY      Family History  Problem Relation Age of Onset   Stroke Mother     Social History   Tobacco Use   Smoking status: Never   Smokeless tobacco: Never  Vaping Use   Vaping Use: Never used  Substance Use Topics   Alcohol use: No    Alcohol/week: 0.0 standard drinks   Drug use: No     Home Medications Prior to Admission medications   Medication Sig Start Date End Date Taking? Authorizing Provider  acetaminophen (TYLENOL) 325 MG tablet Take 2 tablets (650 mg total) by mouth every 6 (six) hours as needed for mild pain (or Fever >/= 101). 07/02/15   Idelle Crouch, MD  albuterol (PROVENTIL HFA;VENTOLIN HFA) 108 (90 BASE) MCG/ACT inhaler Inhale 2 puffs into  the lungs every 6 (six) hours as needed for wheezing or shortness of breath.    [provider]  ALPRAZolam Duanne Moron) 0.5 MG tablet Take 1 tablet (0.5 mg total) by mouth 3 (three) times daily as needed for anxiety. 02/06/21   Johnson, Clanford L, MD  amLODipine (NORVASC) 10 MG tablet Take 1 tablet (10 mg total) by mouth daily. 01/17/21   Ezekiel Slocumb, DO  busPIRone (BUSPAR) 7.5 MG tablet Take 1 tablet (7.5 mg total) by mouth 2 (two) times daily. 01/17/21   Ezekiel Slocumb, DO  cholecalciferol (VITAMIN D3) 25 MCG (1000 UNIT) tablet Take 2,000 Units by mouth daily.    [provider]  citalopram (CELEXA)  20 MG tablet Take 1 tablet (20 mg total) by mouth every morning. 02/06/21   Johnson, Clanford L, MD  Cyanocobalamin 1000 MCG TBCR Take 1,000 mcg by mouth daily.     [provider]  fluticasone (FLONASE) 50 MCG/ACT nasal spray Place 2 sprays into both nostrils daily as needed for allergies. 03/03/17   [provider]  Fluticasone-Salmeterol (ADVAIR) 250-50 MCG/DOSE AEPB Inhale 1 puff into the lungs 2 (two) times daily as needed (shortness of breath).     [provider]  gabapentin (NEURONTIN) 100 MG capsule Take 2 capsules (200 mg total) by mouth 4 (four) times daily. 02/06/21   Johnson, Clanford L, MD  iron polysaccharides (NIFEREX) 150 MG capsule Take 1 capsule (150 mg total) by mouth daily. 01/17/21   Ezekiel Slocumb, DO  lamoTRIgine (LAMICTAL) 25 MG tablet Take 50 mg by mouth at bedtime. 08/05/20   [provider]  levothyroxine (SYNTHROID, LEVOTHROID) 50 MCG tablet Take 50 mcg by mouth daily before breakfast. Take 30 to 60 minutes before breakfast.    [provider]  loratadine (CLARITIN) 10 MG tablet Take 1 tablet (10 mg total) by mouth daily. 01/17/21   Ezekiel Slocumb, DO  memantine (NAMENDA) 10 MG tablet Take 10 mg by mouth 2 (two) times daily.    [provider]  montelukast (SINGULAIR) 10 MG tablet Take 10 mg by mouth at bedtime.    [provider]  Multiple Vitamin (MULTI-VITAMIN) tablet Take 1 tablet by mouth daily.    [provider]  Multiple Vitamins-Minerals (PRESERVISION AREDS 2) CAPS Take 1 capsule by mouth 2 (two) times a day.    [provider]  ondansetron (ZOFRAN) 4 MG tablet Take 1 tablet (4 mg total) by mouth every 6 (six) hours as needed for nausea. 01/17/21   Ezekiel Slocumb, DO  oxybutynin (DITROPAN) 5 MG tablet Take 5 mg by mouth 3 (three) times daily.    [provider]  pantoprazole (PROTONIX) 20 MG tablet Take 20 mg by mouth 2 (two) times daily.  07/24/15   [provider]   QUEtiapine (SEROQUEL) 25 MG tablet Take 0.5 tablets (12.5 mg total) by mouth every 6 (six) hours as needed (agitation). 01/17/21   Nicole Kindred A, DO  rosuvastatin (CRESTOR) 5 MG tablet Take 5 mg by mouth daily.    [provider]  vitamin E 180 MG (400 UNITS) capsule Take 400 Units by mouth daily.    [provider]     Allergies    Aspirin, Diazepam, Other, Tetanus toxoids, and Morphine   Review of Systems   Review of Systems Unable to assess due to mental status.    Physical Exam BP 121/77   Pulse 88   Temp 98.4 F (36.9 C) (Oral)  Resp 16   Ht 5\' 7"  (1.702 m)   Wt 60 kg   SpO2 94%   BMI 20.72 kg/m   Physical Exam Vitals and nursing note reviewed.  Constitutional:      Appearance: Normal appearance.  HENT:     Head: Normocephalic.     Comments: Healing bruise and laceration on L face from recent fall    Nose: Nose normal.     Mouth/Throat:     Mouth: Mucous membranes are moist.  Eyes:     Extraocular Movements: Extraocular movements intact.     Conjunctiva/sclera: Conjunctivae normal.  Cardiovascular:     Rate and Rhythm: Normal rate.  Pulmonary:     Effort: Pulmonary effort is normal.     Breath sounds: Normal breath sounds.  Abdominal:     General: Abdomen is flat.     Palpations: Abdomen is soft.     Tenderness: There is no abdominal tenderness.  Musculoskeletal:        General: No swelling. Normal range of motion.     Cervical back: Neck supple. No tenderness.  Skin:    General: Skin is warm and dry.  Neurological:     Mental Status: She is disoriented.     Comments: Follows commands, moves all extremities, somnolent  Psychiatric:        Mood and Affect: Mood normal.     ED Results / Procedures / Treatments   Labs (all labs ordered are listed, but only abnormal results are displayed) Labs Reviewed  BASIC METABOLIC PANEL - Abnormal; Notable for the following components:      Result Value   Potassium 3.3 (*)    Glucose,  Bld 107 (*)    BUN 41 (*)    Creatinine, Ser 1.13 (*)    GFR, Estimated 48 (*)    All other components within normal limits  CBC WITH DIFFERENTIAL/PLATELET - Abnormal; Notable for the following components:   WBC 20.2 (*)    RDW 18.1 (*)    Neutro Abs 16.0 (*)    Monocytes Absolute 1.4 (*)    Abs Immature Granulocytes 0.08 (*)    All other components within normal limits  URINALYSIS, ROUTINE W REFLEX MICROSCOPIC - Abnormal; Notable for the following components:   Color, Urine AMBER (*)    APPearance CLOUDY (*)    Hgb urine dipstick SMALL (*)    Ketones, ur 5 (*)    Protein, ur 30 (*)    Leukocytes,Ua SMALL (*)    Bacteria, UA MANY (*)    All other components within normal limits  RESP PANEL BY RT-PCR (FLU A&B, COVID) [id number]    EKG EKG Interpretation  Date/Time:  Sunday July 13 2021 09:45:28 EDT Ventricular Rate:  86 PR Interval:  173 QRS Duration: 152 QT Interval:  427 QTC Calculation: 511 R Axis:   -47 Text Interpretation: Sinus rhythm Atrial premature complex Left bundle branch block No significant change since last tracing Confirmed by Calvert Cantor [phone]) on 07/13/2021 10:22:20 AM  Radiology DG Chest 1 View  Result Date: 07/11/2021 CLINICAL DATA:  Recent fall with left-sided pain, initial encounter EXAM: PORTABLE CHEST 1 VIEW COMPARISON:  05/04/2021 FINDINGS: Cardiac shadow is mildly prominent but stable. Aortic calcifications are noted. The lungs are well aerated bilaterally. No focal infiltrate or sizable effusion is seen. Postsurgical changes in the left shoulder are noted. No acute bony abnormality is seen. IMPRESSION: No acute abnormality noted. Electronically Signed   By: Linus Mako.D.  On: 07/11/2021 19:31   CT Head Wo Contrast  Result Date: 07/13/2021 CLINICAL DATA:  Mental status change.  Recent hemorrhage EXAM: CT HEAD WITHOUT CONTRAST TECHNIQUE: Contiguous axial images were obtained from the base of the skull through the vertex without intravenous  contrast. COMPARISON:  07/11/2021 FINDINGS: Brain: Larger anterior and parasagittal left frontal hematoma along the surface of the brain which measures 20 x 21 x 3.6 cm. Location appears intraparenchymal, although on prior there was clearly a subarachnoid appearance. No adjacent edema or significant mass effect in the setting of atrophy. Chronic small vessel ischemia in the hemispheric white matter which is age congruent. No visible cortical infarct. Reportedly there was a hemorrhage precipitating the initial bleed. Vascular: Atheromatous calcification Skull: Negative Sinuses/Orbits: No visible injury.  Bilateral cataract resection. Other: Critical Value/emergent results were called by telephone at the time of interpretation on 07/13/2021 at 10:58 am to provider [facility] , who verbally acknowledged these results. IMPRESSION: Larger and now 7 cc left frontal parasagittal hemorrhage which appears both parenchymal and subarachnoid. No edema or significant mass effect. Electronically Signed   By: Jorje Guild M.D.   On: 07/13/2021 10:59   CT HEAD WO CONTRAST  Result Date: 07/11/2021 CLINICAL DATA:  Recent fall with facial swelling, initial encounter EXAM: CT HEAD WITHOUT CONTRAST TECHNIQUE: Contiguous axial images were obtained from the base of the skull through the vertex without intravenous contrast. COMPARISON:  02/03/2021 FINDINGS: Brain: Chronic atrophic and ischemic changes are again identified. There is minimal subarachnoid hemorrhage along the medial aspect of the left frontal lobe best seen on images 16 through 18 of series 2. No other focal area of hemorrhage is identified. Vascular: No hyperdense vessel or unexpected calcification. Skull: Normal. Negative for fracture or focal lesion. Sinuses/Orbits: No acute finding. Other: Mild soft tissue swelling is noted over the left supraorbital region consistent with the recent injury. IMPRESSION: Mild subarachnoid hemorrhage in the medial aspect of the  left frontal lobe. Chronic atrophic and ischemic changes. Left supraorbital soft tissue swelling consistent with the recent injury. Critical Value/emergent results were called by telephone at the time of interpretation on 07/11/2021 at 7:18 pm to Dr. Varney Biles , who verbally acknowledged these results. Electronically Signed   By: Inez Catalina M.D.   On: 07/11/2021 19:20   CT Cervical Spine Wo Contrast  Result Date: 07/11/2021 CLINICAL DATA:  Neck trauma.  Fall. EXAM: CT CERVICAL SPINE WITHOUT CONTRAST TECHNIQUE: Multidetector CT imaging of the cervical spine was performed without intravenous contrast. Multiplanar CT image reconstructions were also generated. COMPARISON:  Cervical spine CT 02/03/2021.  Is this FINDINGS: Alignment: There is mild chronic anterolisthesis at C2-C3 and C7-T1. Alignment is otherwise anatomic. Skull base and vertebrae: Visualized skull base is intact. No acute fractures. No focal osseous lesions. Soft tissues and spinal canal: No prevertebral fluid or swelling. No visible canal hematoma. Disc levels: There is intervertebral disc space narrowing throughout the cervical spine which is severe at C4-C5, C5-C6, C6-C7 and C7-T1. Findings are similar to the prior examination. There is no severe central canal or neural foraminal stenosis at any level. Upper chest: Negative. Other: None. IMPRESSION: 1. No acute fracture or traumatic subluxation. 2. Stable multilevel degenerative changes. Electronically Signed   By: Ronney Asters M.D.   On: 07/11/2021 20:20   DG Hip Unilat With Pelvis 2-3 Views Left  Result Date: 07/11/2021 CLINICAL DATA:  Recent fall with left hip pain, initial encounter EXAM: DG HIP (WITH OR WITHOUT PELVIS) 2-3V LEFT COMPARISON:  02/03/2021 FINDINGS: Pelvic ring is intact. Postsurgical changes are noted in the lumbosacral spine as well as in the proximal left femur. There is been further progressive collapse of the left femoral head when compared with the prior exam.  This is of uncertain chronicity but new from the prior exam of 02/03/2021. The surgically placed screws appear somewhat lucent when compared with the prior exam this is likely related to the head collapse. Mild degenerative changes of the hip joint are seen. IMPRESSION: Postsurgical changes in the proximal left femur. Progressive collapse of the left femoral head is noted with some mild displacement of previously placed surgical screws. The collapse of the femoral head is of uncertain chronicity Electronically Signed   By: Inez Catalina M.D.   On: 07/11/2021 19:33    Procedures .Critical Care Performed by: Truddie Hidden, MD Authorized by: Truddie Hidden, MD   Critical care provider statement:    Critical care time (minutes):  45   Critical care time was exclusive of:  Separately billable procedures and treating other patients   Critical care was necessary to treat or prevent imminent or life-threatening deterioration of the following conditions:  CNS failure or compromise   Critical care was time spent personally by me on the following activities:  Discussions with consultants, evaluation of patient's response to treatment, examination of patient, ordering and performing treatments and interventions, ordering and review of laboratory studies, ordering and review of radiographic studies, pulse oximetry, re-evaluation of patient's condition, obtaining history from patient or surrogate and review of old charts   Care discussed with: admitting provider    Medications Ordered in the ED Medications  ondansetron (ZOFRAN) injection 4 mg (has no administration in time range)     MDM Rules/Calculators/A&P MDM Given reported change in status from recent ED visit, will recheck CT and labs. Patient is comfortable appearing in no distress, normal vitals.   ED Course  I have reviewed the triage vital signs and the nursing notes.  Pertinent labs & imaging results that were available during my care  of the patient were reviewed by me and considered in my medical decision making (see chart for details).  Clinical Course as of 07/13/21 1541  Sun Jul 13, 2021  1039 CT images reviewed, prior [facility] has enlarged. Will discuss with Neurosurgery [CS]  8416 CBC with increasing WBC, PLT improved from prior thrombocytopenia requiring ROMIPLOSTIM injection 9/14. [CS]  6063 KZSWF with Reinaldo Meeker, NP with Dr. Annette Stable who is in the OR. They will review the images and call back with recommendations. [CS]  0932 Per Neurosurgery team, recommend admission to [facility] to hospitalist service and they will consult.  [CS]  3557 Left a message with Ms. Robina Ade [phone], Social Worker who is responsible for patient per previous chart. Spoke with Dr. Dyann Kief, Hospitalist, who will evaluate for admission.  [CS]    Clinical Course User Index [CS] Truddie Hidden, MD    Final Clinical Impression(s) / ED Diagnoses Final diagnoses:  Intracranial hemorrhage ([place])  Altered mental status, unspecified altered mental status type    Rx / DC Orders ED Discharge Orders     None        Truddie Hidden, MD 07/13/21 1541

## 2021-07-14 DIAGNOSIS — F32A Depression, unspecified: Secondary | ICD-10-CM | POA: Diagnosis present

## 2021-07-14 DIAGNOSIS — R41841 Cognitive communication deficit: Secondary | ICD-10-CM | POA: Diagnosis present

## 2021-07-14 DIAGNOSIS — Y92129 Unspecified place in nursing home as the place of occurrence of the external cause: Secondary | ICD-10-CM | POA: Diagnosis not present

## 2021-07-14 DIAGNOSIS — F419 Anxiety disorder, unspecified: Secondary | ICD-10-CM | POA: Diagnosis present

## 2021-07-14 DIAGNOSIS — R4182 Altered mental status, unspecified: Secondary | ICD-10-CM | POA: Diagnosis not present

## 2021-07-14 DIAGNOSIS — D72829 Elevated white blood cell count, unspecified: Secondary | ICD-10-CM | POA: Diagnosis not present

## 2021-07-14 DIAGNOSIS — F0280 Dementia in other diseases classified elsewhere without behavioral disturbance: Secondary | ICD-10-CM | POA: Diagnosis not present

## 2021-07-14 DIAGNOSIS — I609 Nontraumatic subarachnoid hemorrhage, unspecified: Secondary | ICD-10-CM | POA: Diagnosis not present

## 2021-07-14 DIAGNOSIS — G932 Benign intracranial hypertension: Secondary | ICD-10-CM | POA: Diagnosis present

## 2021-07-14 DIAGNOSIS — Z6820 Body mass index (BMI) 20.0-20.9, adult: Secondary | ICD-10-CM | POA: Diagnosis not present

## 2021-07-14 DIAGNOSIS — Z28310 Unvaccinated for covid-19: Secondary | ICD-10-CM | POA: Diagnosis not present

## 2021-07-14 DIAGNOSIS — W1830XA Fall on same level, unspecified, initial encounter: Secondary | ICD-10-CM | POA: Diagnosis present

## 2021-07-14 DIAGNOSIS — K219 Gastro-esophageal reflux disease without esophagitis: Secondary | ICD-10-CM | POA: Diagnosis present

## 2021-07-14 DIAGNOSIS — Z823 Family history of stroke: Secondary | ICD-10-CM | POA: Diagnosis not present

## 2021-07-14 DIAGNOSIS — Z885 Allergy status to narcotic agent status: Secondary | ICD-10-CM | POA: Diagnosis not present

## 2021-07-14 DIAGNOSIS — E7800 Pure hypercholesterolemia, unspecified: Secondary | ICD-10-CM | POA: Diagnosis present

## 2021-07-14 DIAGNOSIS — E876 Hypokalemia: Secondary | ICD-10-CM | POA: Diagnosis present

## 2021-07-14 DIAGNOSIS — I629 Nontraumatic intracranial hemorrhage, unspecified: Secondary | ICD-10-CM | POA: Diagnosis present

## 2021-07-14 DIAGNOSIS — J449 Chronic obstructive pulmonary disease, unspecified: Secondary | ICD-10-CM | POA: Diagnosis present

## 2021-07-14 DIAGNOSIS — F02818 Dementia in other diseases classified elsewhere, unspecified severity, with other behavioral disturbance: Secondary | ICD-10-CM | POA: Diagnosis present

## 2021-07-14 DIAGNOSIS — E440 Moderate protein-calorie malnutrition: Secondary | ICD-10-CM | POA: Diagnosis present

## 2021-07-14 DIAGNOSIS — I10 Essential (primary) hypertension: Secondary | ICD-10-CM | POA: Diagnosis present

## 2021-07-14 DIAGNOSIS — E039 Hypothyroidism, unspecified: Secondary | ICD-10-CM | POA: Diagnosis present

## 2021-07-14 DIAGNOSIS — G301 Alzheimer's disease with late onset: Secondary | ICD-10-CM | POA: Diagnosis present

## 2021-07-14 DIAGNOSIS — Z20822 Contact with and (suspected) exposure to covid-19: Secondary | ICD-10-CM | POA: Diagnosis present

## 2021-07-14 DIAGNOSIS — I619 Nontraumatic intracerebral hemorrhage, unspecified: Secondary | ICD-10-CM | POA: Diagnosis present

## 2021-07-14 DIAGNOSIS — G2581 Restless legs syndrome: Secondary | ICD-10-CM | POA: Diagnosis present

## 2021-07-14 DIAGNOSIS — M810 Age-related osteoporosis without current pathological fracture: Secondary | ICD-10-CM | POA: Diagnosis present

## 2021-07-14 DIAGNOSIS — S066X0A Traumatic subarachnoid hemorrhage without loss of consciousness, initial encounter: Secondary | ICD-10-CM | POA: Diagnosis not present

## 2021-07-14 DIAGNOSIS — Z96612 Presence of left artificial shoulder joint: Secondary | ICD-10-CM | POA: Diagnosis present

## 2021-07-14 DIAGNOSIS — S066XAA Traumatic subarachnoid hemorrhage with loss of consciousness status unknown, initial encounter: Secondary | ICD-10-CM | POA: Diagnosis present

## 2021-07-14 DIAGNOSIS — Z7401 Bed confinement status: Secondary | ICD-10-CM | POA: Diagnosis not present

## 2021-07-14 DIAGNOSIS — Z66 Do not resuscitate: Secondary | ICD-10-CM | POA: Diagnosis present

## 2021-07-14 DIAGNOSIS — D696 Thrombocytopenia, unspecified: Secondary | ICD-10-CM | POA: Diagnosis present

## 2021-07-14 MED ORDER — ORAL CARE MOUTH RINSE
15.0000 mL | Freq: Two times a day (BID) | OROMUCOSAL | Status: DC
  Administered 2021-07-14 – 2021-07-15 (×3): 15 mL via OROMUCOSAL

## 2021-07-14 MED ORDER — PANTOPRAZOLE SODIUM 40 MG PO TBEC
40.0000 mg | DELAYED_RELEASE_TABLET | Freq: Every day | ORAL | Status: DC
  Administered 2021-07-14 – 2021-07-15 (×2): 40 mg via ORAL
  Filled 2021-07-14 (×2): qty 1

## 2021-07-14 MED ORDER — NAPHAZOLINE-GLYCERIN 0.012-0.25 % OP SOLN
1.0000 [drp] | Freq: Four times a day (QID) | OPHTHALMIC | Status: DC | PRN
  Filled 2021-07-14: qty 15

## 2021-07-14 MED ORDER — POTASSIUM CHLORIDE IN NACL 20-0.9 MEQ/L-% IV SOLN
INTRAVENOUS | Status: AC
  Filled 2021-07-14 (×2): qty 1000

## 2021-07-14 NOTE — Progress Notes (Signed)
[facility]  PROGRESS NOTE  Ariana [name] YWV:[id number] DOB: 05-07-1933 DOA: 07/13/2021 PCP: Caprice Renshaw, MD   Brief HPI:   87 year old female with history of ITP, asthma/COPD, Alzheimer's disease with behavioral disturbance, GERD, hypertension, hyperlipidemia, hypothyroidism, CKD stage IIIb with recent mechanical fall involvement of head trauma came to ED with nausea and vomiting and worsening changes in mentation. On 07/11/2021 patient experienced mechanical fall at the facility when she hit her head.  Imaging showed small subarachnoid hemorrhage and was felt safe to return to facility.  Due to worsening of symptoms patient changes in mental status she was brought back to the hospital.  CT scan of the head showed enlargement of previously seen subarachnoid hemorrhage, no mass-effect no midline shift.  Case was discussed with neurosurgery who recommended transfer to [facility].    Subjective   Patient seen and examined, she is alert, denies any complaints.  Nausea and vomiting seems to have resolved, she continues to have poor p.o. intake.   Assessment/Plan:     Subarachnoid hemorrhage -CT scan head showed larger, now 7 cc left frontal parasagittal hemorrhage -Appears both parenchymal and subarachnoid -Per facility patient's mental status has worsened, having trouble following simple commands -Neurosurgery was consulted -Dr. Arnoldo Morale reviewed the films, recommends no intervention  Nausea and vomiting -Patient had nausea and vomiting yesterday -Resolved -Likely from increased intracranial pressure -Continue as needed Zofran  History of ITP -Platelet counts are normal  Leukocytosis -Unclear etiology -UA is mildly abnormal -We will obtain urine culture  History of asthma/COPD -No wheezing -Continue nebulizers, Singulair  Alzheimer's dementia -No behavior disturbance at this time -Continue Namenda, Lamictal, Celexa, BuSpar, as needed  Xanax  Hypothyroidism -Continue Synthroid  GERD -Continue PPI      Scheduled medications:    amLODipine  10 mg Oral Daily   busPIRone  7.5 mg Oral BID   citalopram  20 mg Oral BH-q7a   fluticasone furoate-vilanterol  1 puff Inhalation Daily   gabapentin  200 mg Oral QID   lamoTRIgine  50 mg Oral QHS   levothyroxine  50 mcg Oral QAC breakfast   mouth rinse  15 mL Mouth Rinse BID   memantine  10 mg Oral BID   montelukast  10 mg Oral QHS   oxybutynin  5 mg Oral TID   pantoprazole  40 mg Oral Daily     Data Reviewed:   CBG:  No results for input(s): GLUCAP in the last 168 hours.  SpO2: 94 %    Vitals:   07/14/21 0418 07/14/21 0758 07/14/21 0855 07/14/21 1503  BP: 133/66  (!) 127/58 128/62  Pulse: 83 83 81 74  Resp: 16 17 18 16   Temp: 98.4 F (36.9 C)  98.1 F (36.7 C) 97.9 F (36.6 C)  TempSrc: Oral  Oral Axillary  SpO2: 94% 94% 94% 94%  Weight:      Height:         Intake/Output Summary (Last 24 hours) at 07/14/2021 1514 Last data filed at 07/14/2021 1447 Gross per 24 hour  Intake 723.6 ml  Output 500 ml  Net 223.6 ml    10/01 1901 - 10/03 0700 In: 120 [P.O.:120] Out: -   Filed Weights   07/13/21 0954  Weight: 60 kg    Data Reviewed: Basic Metabolic Panel: Recent Labs  Lab 07/11/21 1840 07/13/21 1011  NA 141 140  K 4.0 3.3*  CL 104 102  CO2 27 27  GLUCOSE 87 107*  BUN 27* 41*  CREATININE  0.86 1.13*  CALCIUM 9.4 9.4   Liver Function Tests: Recent Labs  Lab 07/11/21 1840  AST 29  ALT 27  ALKPHOS 152*  BILITOT 0.5  PROT 7.4  ALBUMIN 4.3   No results for input(s): LIPASE, AMYLASE in the last 168 hours. No results for input(s): AMMONIA in the last 168 hours. CBC: Recent Labs  Lab 07/11/21 1840 07/13/21 1011  WBC 15.6* 20.2*  NEUTROABS 10.9* 16.0*  HGB 14.4 14.6  HCT 43.8 44.7  MCV 92.0 92.9  PLT 351 298   Cardiac Enzymes: No results for input(s): CKTOTAL, CKMB, CKMBINDEX, TROPONINI in the last 168 hours. BNP (last 3  results) No results for input(s): BNP in the last 8760 hours.  ProBNP (last 3 results) No results for input(s): PROBNP in the last 8760 hours.  CBG: No results for input(s): GLUCAP in the last 168 hours.     Radiology Reports  CT Head Wo Contrast  Result Date: 07/13/2021 CLINICAL DATA:  Mental status change.  Recent hemorrhage EXAM: CT HEAD WITHOUT CONTRAST TECHNIQUE: Contiguous axial images were obtained from the base of the skull through the vertex without intravenous contrast. COMPARISON:  07/11/2021 FINDINGS: Brain: Larger anterior and parasagittal left frontal hematoma along the surface of the brain which measures 20 x 21 x 3.6 cm. Location appears intraparenchymal, although on prior there was clearly a subarachnoid appearance. No adjacent edema or significant mass effect in the setting of atrophy. Chronic small vessel ischemia in the hemispheric white matter which is age congruent. No visible cortical infarct. Reportedly there was a hemorrhage precipitating the initial bleed. Vascular: Atheromatous calcification Skull: Negative Sinuses/Orbits: No visible injury.  Bilateral cataract resection. Other: Critical Value/emergent results were called by telephone at the time of interpretation on 07/13/2021 at 10:58 am to provider [facility] , who verbally acknowledged these results. IMPRESSION: Larger and now 7 cc left frontal parasagittal hemorrhage which appears both parenchymal and subarachnoid. No edema or significant mass effect. Electronically Signed   By: Jorje Guild M.D.   On: 07/13/2021 10:59       Antibiotics: Anti-infectives (From admission, onward)    None         DVT prophylaxis: SCDs  Code Status: DNR  Family Communication: No family at bedside   Consultants:   Procedures:     Objective    Physical Examination:  General-appears in no acute distress Heart-S1-S2, regular, no murmur auscultated Lungs-clear to auscultation bilaterally, no wheezing  or crackles auscultated Abdomen-soft, nontender, no organomegaly Extremities-no edema in the lower extremities Neuro-alert, oriented x3, no focal deficit noted  Status is: Inpatient  Dispo: The patient is from: Skilled nursing facility              Anticipated d/c is to: Skilled nursing facility              Anticipated d/c date is: 07/16/2021              Patient currently not stable for discharge  Barrier to discharge-ongoing evaluation for cerebral hemorrhage, poor p.o. intake  COVID-19 Labs  No results for input(s): DDIMER, FERRITIN, LDH, CRP in the last 72 hours.  Lab Results  Component Value Date   SARSCOV2NAA NEGATIVE 07/13/2021   [place] NEGATIVE 02/03/2021   [place] NEGATIVE 01/16/2021   [place] NEGATIVE 01/05/2021            Recent Results (from the past 240 hour(s))  Resp Panel by RT-PCR (Flu A&B, Covid) Nasopharyngeal Swab     Status: None  Collection Time: 07/13/21 12:26 PM   Specimen: Nasopharyngeal Swab; Nasopharyngeal(NP) swabs in vial transport medium  Result Value Ref Range Status   SARS Coronavirus 2 by RT PCR NEGATIVE NEGATIVE Final    Comment: (NOTE) SARS-CoV-2 target nucleic acids are NOT DETECTED.  The SARS-CoV-2 RNA is generally detectable in upper respiratory specimens during the acute phase of infection. The lowest concentration of SARS-CoV-2 viral copies this assay can detect is 138 copies/mL. A negative result does not preclude SARS-Cov-2 infection and should not be used as the sole basis for treatment or other patient management decisions. A negative result may occur with  improper specimen collection/handling, submission of specimen other than nasopharyngeal swab, presence of viral mutation(s) within the areas targeted by this assay, and inadequate number of viral copies(<138 copies/mL). A negative result must be combined with clinical observations, patient history, and epidemiological information. The expected result is  Negative.  Fact Sheet for Patients:  EntrepreneurPulse.com.au  Fact Sheet for Healthcare Providers:  IncredibleEmployment.be  This test is no t yet approved or cleared by the [place] FDA and  has been authorized for detection and/or diagnosis of SARS-CoV-2 by FDA under an Emergency Use Authorization (EUA). This EUA will remain  in effect (meaning this test can be used) for the duration of the COVID-19 declaration under Section 564(b)(1) of the Act, 21 U.S.C.section 360bbb-3(b)(1), unless the authorization is terminated  or revoked sooner.       Influenza A by PCR NEGATIVE NEGATIVE Final   Influenza B by PCR NEGATIVE NEGATIVE Final    Comment: (NOTE) The Xpert Xpress SARS-CoV-2/FLU/RSV plus assay is intended as an aid in the diagnosis of influenza from Nasopharyngeal swab specimens and should not be used as a sole basis for treatment. Nasal washings and aspirates are unacceptable for Xpert Xpress SARS-CoV-2/FLU/RSV testing.  Fact Sheet for Patients: EntrepreneurPulse.com.au  Fact Sheet for Healthcare Providers: IncredibleEmployment.be  This test is not yet approved or cleared by the [place] FDA and has been authorized for detection and/or diagnosis of SARS-CoV-2 by FDA under an Emergency Use Authorization (EUA). This EUA will remain in effect (meaning this test can be used) for the duration of the COVID-19 declaration under Section 564(b)(1) of the Act, 21 U.S.C. section 360bbb-3(b)(1), unless the authorization is terminated or revoked.  Performed at [facility], [address], [place], [place] [address]     [facility] If 7PM-7AM, please contact night-coverage at [email or web address], Office  [phone]   07/14/2021, 3:14 PM  LOS: 0 days

## 2021-07-14 NOTE — Consult Note (Signed)
Providing Compassionate, Quality Care - Together   Reason for Consult: SAH/Intraparenchymal hemorrhage Referring Physician: Dr. [name] Ariana [name] is an 87 y.o. female.  HPI: Ariana [name] is an 87 year old female with a history significant for Alzheimer's disease, COPD, depression/anxiety, GERD, hypercholesterolemia, hypothyroidism, ITP, osteoarthritis, and osteoporosis. She resides in a nursing home where she suffered a mechanical fall, with a significant blow to the head on 07/11/2021. Her exam at that time was nonfocal and she returned to her nursing home. She returned to [facility] on 07/13/2021 due to more confusion per the staff at her facility. CT performed in the ED demonstrated a left frontal parasagittal hemorrhage which appears both parenchymal and subarachnoid in nature. The patient is resting comfortably. She reports a headache and left facial pain. There has been no report of seizure activity, fever, chills, coughing, chest pain, abdominal pain, vision changes, or focal weakness.  Past Medical History:  Diagnosis Date   Alzheimer disease ([place])    Anemia    Anxiety    Asthma    WELL CONTROLLED   Chronic back pain    Cognitive communication deficit    COPD (chronic obstructive pulmonary disease) (HCC)    Depression    Depression    GERD (gastroesophageal reflux disease)    Hiatal hernia    Hypercholesteremia    Hypothyroidism    Iron deficiency    ITP (idiopathic thrombocytopenic purpura)    FOLLOWED BR DR Grayland Ormond   LBBB (left bundle branch block)    Osteoarthritis    Osteoporosis    Restless legs     Past Surgical History:  Procedure Laterality Date   CARPAL TUNNEL RELEASE     ESOPHAGOGASTRODUODENOSCOPY (EGD) WITH PROPOFOL N/A 09/10/2015   Procedure: ESOPHAGOGASTRODUODENOSCOPY (EGD) WITH PROPOFOL;  Surgeon: Lollie Sails, MD;  Location: [facility] ENDOSCOPY;  Service: Endoscopy;  Laterality: N/A;   HIP PINNING,CANNULATED Left 10/31/2020   Procedure: CANNULATED  HIP PINNING;  Surgeon: Leim Fabry, MD;  Location: [facility] ORS;  Service: Orthopedics;  Laterality: Left;   IRRIGATION AND DEBRIDEMENT HEMATOMA Right 07/13/2018   Procedure: IRRIGATION AND DEBRIDEMENT HEMATOMA-RIGHT SHIN;  Surgeon: Herbert Pun, MD;  Location: [facility] ORS;  Service: General;  Laterality: Right;   KYPHOPLASTY N/A 05/02/2019   Procedure: L1 KYPHOPLASTY;  Surgeon: Hessie Knows, MD;  Location: [facility] ORS;  Service: Orthopedics;  Laterality: N/A;   LUMBAR DISC SURGERY     REVERSE SHOULDER ARTHROPLASTY Left 06/25/2020   Procedure: REVERSE SHOULDER ARTHROPLASTY;  Surgeon: Corky Mull, MD;  Location: [facility] ORS;  Service: Orthopedics;  Laterality: Left;   SPLENECTOMY, PARTIAL     TOTAL ABDOMINAL HYSTERECTOMY      Family History  Problem Relation Age of Onset   Stroke Mother     Social History:  reports that she has never smoked. She has never used smokeless tobacco. She reports that she does not drink alcohol and does not use drugs.  Allergies:  Allergies  Allergen Reactions   Aspirin Other (See Comments)    Upset stomach   Diazepam Itching    sneezing   Tetanus Toxoids Swelling    Localized superficial swelling of skin   Morphine Itching and Rash    Medications: I have reviewed the patient's current medications.  Results for orders placed or performed during the hospital encounter of 07/13/21 (from the past 48 hour(s))  Basic metabolic panel     Status: Abnormal   Collection Time: 07/13/21 10:11 AM  Result Value Ref Range   Sodium  140 135 - 145 mmol/L   Potassium 3.3 (L) 3.5 - 5.1 mmol/L    Comment: DELTA CHECK NOTED   Chloride 102 98 - 111 mmol/L   CO2 27 22 - 32 mmol/L   Glucose, Bld 107 (H) 70 - 99 mg/dL    Comment: Glucose reference range applies only to samples taken after fasting for at least 8 hours.   BUN 41 (H) 8 - 23 mg/dL   Creatinine, Ser 1.13 (H) 0.44 - 1.00 mg/dL   Calcium 9.4 8.9 - 10.3 mg/dL   GFR, Estimated 48 (L) >60 mL/min    Comment:  (NOTE) Calculated using the CKD-EPI Creatinine Equation (2021)    Anion gap 11 5 - 15    Comment: Performed at [facility], [address] [place], [place] [address]  CBC with Differential     Status: Abnormal   Collection Time: 07/13/21 10:11 AM  Result Value Ref Range   WBC 20.2 (H) 4.0 - 10.5 K/uL    Comment: REPEATED TO VERIFY WHITE COUNT CONFIRMED ON SMEAR    RBC 4.81 3.87 - 5.11 MIL/uL   Hemoglobin 14.6 12.0 - 15.0 g/dL   HCT 44.7 36.0 - 46.0 %   MCV 92.9 80.0 - 100.0 fL   MCH 30.4 26.0 - 34.0 pg   MCHC 32.7 30.0 - 36.0 g/dL   RDW 18.1 (H) 11.5 - 15.5 %   Platelets 298 150 - 400 K/uL    Comment: SPECIMEN CHECKED FOR CLOTS REPEATED TO VERIFY PLATELET COUNT CONFIRMED BY SMEAR    nRBC 0.0 0.0 - 0.2 %    Comment: REPEATED TO VERIFY   Neutrophils Relative % 80 %   Neutro Abs 16.0 (H) 1.7 - 7.7 K/uL   Lymphocytes Relative 12 %   Lymphs Abs 2.4 0.7 - 4.0 K/uL   Monocytes Relative 7 %   Monocytes Absolute 1.4 (H) 0.1 - 1.0 K/uL   Eosinophils Relative 1 %   Eosinophils Absolute 0.2 0.0 - 0.5 K/uL   Basophils Relative 0 %   Basophils Absolute 0.1 0.0 - 0.1 K/uL   WBC Morphology VACUOLATED NEUTROPHILS    Immature Granulocytes 0 %   Abs Immature Granulocytes 0.08 (H) 0.00 - 0.07 K/uL    Comment: Performed at [facility], [address] [place], [place] [address]  Urinalysis, Routine w reflex microscopic Urine, Clean Catch     Status: Abnormal   Collection Time: 07/13/21 10:11 AM  Result Value Ref Range   Color, Urine AMBER (A) YELLOW    Comment: BIOCHEMICALS MAY BE AFFECTED BY COLOR   APPearance CLOUDY (A) CLEAR   Specific Gravity, Urine 1.025 1.005 - 1.030   pH 5.0 5.0 - 8.0   Glucose, UA NEGATIVE NEGATIVE mg/dL   Hgb urine dipstick SMALL (A) NEGATIVE   Bilirubin Urine NEGATIVE NEGATIVE   Ketones, ur 5 (A) NEGATIVE mg/dL   Protein, ur 30 (A) NEGATIVE mg/dL   Nitrite NEGATIVE NEGATIVE   Leukocytes,Ua SMALL (A) NEGATIVE   RBC / HPF 0-5 0 - 5 RBC/hpf   WBC, UA 6-10 0  - 5 WBC/hpf   Bacteria, UA MANY (A) NONE SEEN   Squamous Epithelial / LPF 0-5 0 - 5   Mucus PRESENT    Hyaline Casts, UA PRESENT     Comment: Performed at [facility], [address] [place], [place] [address]  Resp Panel by RT-PCR (Flu A&B, Covid) Nasopharyngeal Swab     Status: None   Collection Time: 07/13/21 12:26 PM  Specimen: Nasopharyngeal Swab; Nasopharyngeal(NP) swabs in vial transport medium  Result Value Ref Range   SARS Coronavirus 2 by RT PCR NEGATIVE NEGATIVE    Comment: (NOTE) SARS-CoV-2 target nucleic acids are NOT DETECTED.  The SARS-CoV-2 RNA is generally detectable in upper respiratory specimens during the acute phase of infection. The lowest concentration of SARS-CoV-2 viral copies this assay can detect is 138 copies/mL. A negative result does not preclude SARS-Cov-2 infection and should not be used as the sole basis for treatment or other patient management decisions. A negative result may occur with  improper specimen collection/handling, submission of specimen other than nasopharyngeal swab, presence of viral mutation(s) within the areas targeted by this assay, and inadequate number of viral copies(<138 copies/mL). A negative result must be combined with clinical observations, patient history, and epidemiological information. The expected result is Negative.  Fact Sheet for Patients:  EntrepreneurPulse.com.au  Fact Sheet for Healthcare Providers:  IncredibleEmployment.be  This test is no t yet approved or cleared by the [place] FDA and  has been authorized for detection and/or diagnosis of SARS-CoV-2 by FDA under an Emergency Use Authorization (EUA). This EUA will remain  in effect (meaning this test can be used) for the duration of the COVID-19 declaration under Section 564(b)(1) of the Act, 21 U.S.C.section 360bbb-3(b)(1), unless the authorization is terminated  or revoked sooner.       Influenza A by PCR  NEGATIVE NEGATIVE   Influenza B by PCR NEGATIVE NEGATIVE    Comment: (NOTE) The Xpert Xpress SARS-CoV-2/FLU/RSV plus assay is intended as an aid in the diagnosis of influenza from Nasopharyngeal swab specimens and should not be used as a sole basis for treatment. Nasal washings and aspirates are unacceptable for Xpert Xpress SARS-CoV-2/FLU/RSV testing.  Fact Sheet for Patients: EntrepreneurPulse.com.au  Fact Sheet for Healthcare Providers: IncredibleEmployment.be  This test is not yet approved or cleared by the [place] FDA and has been authorized for detection and/or diagnosis of SARS-CoV-2 by FDA under an Emergency Use Authorization (EUA). This EUA will remain in effect (meaning this test can be used) for the duration of the COVID-19 declaration under Section 564(b)(1) of the Act, 21 U.S.C. section 360bbb-3(b)(1), unless the authorization is terminated or revoked.  Performed at [facility], [address] [place], [place] [address]     CT Head Wo Contrast  Result Date: 07/13/2021 CLINICAL DATA:  Mental status change.  Recent hemorrhage EXAM: CT HEAD WITHOUT CONTRAST TECHNIQUE: Contiguous axial images were obtained from the base of the skull through the vertex without intravenous contrast. COMPARISON:  07/11/2021 FINDINGS: Brain: Larger anterior and parasagittal left frontal hematoma along the surface of the brain which measures 20 x 21 x 3.6 cm. Location appears intraparenchymal, although on prior there was clearly a subarachnoid appearance. No adjacent edema or significant mass effect in the setting of atrophy. Chronic small vessel ischemia in the hemispheric white matter which is age congruent. No visible cortical infarct. Reportedly there was a hemorrhage precipitating the initial bleed. Vascular: Atheromatous calcification Skull: Negative Sinuses/Orbits: No visible injury.  Bilateral cataract resection. Other: Critical Value/emergent results  were called by telephone at the time of interpretation on 07/13/2021 at 10:58 am to provider [facility] , who verbally acknowledged these results. IMPRESSION: Larger and now 7 cc left frontal parasagittal hemorrhage which appears both parenchymal and subarachnoid. No edema or significant mass effect. Electronically Signed   By: Jorje Guild M.D.   On: 07/13/2021 10:59    Review of Systems  Constitutional: Negative.  HENT: Negative.    Eyes: Negative.   Respiratory: Negative.    Cardiovascular: Negative.   Gastrointestinal:  Positive for nausea and vomiting. Negative for abdominal pain.  Genitourinary: Negative.   Musculoskeletal:  Positive for falls.  Skin: Negative.   Endo/Heme/Allergies:  Bruises/bleeds easily.  Psychiatric/Behavioral:  Positive for memory loss.   Blood pressure (!) 127/58, pulse 81, temperature 98.1 F (36.7 C), temperature source Oral, resp. rate 18, height 5\' 7"  (1.702 m), weight 60 kg, SpO2 94 %. Estimated body mass index is 20.72 kg/m as calculated from the following:   Height as of this encounter: 5\' 7"  (1.702 m).   Weight as of this encounter: 60 kg.  Physical Exam HENT:     Head:   Cardiovascular:     Rate and Rhythm: Normal rate and regular rhythm.     Pulses: Normal pulses.  Pulmonary:     Effort: Pulmonary effort is normal. No respiratory distress.  Musculoskeletal:     Cervical back: Normal range of motion and neck supple. No tenderness.  Skin:    General: Skin is warm and dry.     Capillary Refill: Capillary refill takes less than 2 seconds.     Findings: Bruising present.  Neurological:     Mental Status: She is alert.     GCS: GCS eye subscore is 4. GCS verbal subscore is 4. GCS motor subscore is 6.     Sensory: Sensation is intact.     Motor: Motor function is intact.     Comments: Patient is oriented to self, [facility], and that she fell She is not oriented to month, year, or hospital  Psychiatric:        Mood and Affect: Mood  normal.        Behavior: Behavior normal.    Assessment/Plan: Patient suffered a fall with significant blow to the head. She appears to be at her neurologic baseline. No surgical intervention recommended. No need for further imaging unless her neurological exam decline.  Viona Gilmore, DNP, AGNP-C Nurse Practitioner  [facility] [place] [address], [address], [place],  [address] P: [phone]    F: [phone]  07/14/2021, 3:01 PM

## 2021-07-14 NOTE — Plan of Care (Signed)
  Problem: Education: Goal: Knowledge of disease or condition will improve Outcome: Progressing Goal: Knowledge of secondary prevention will improve Outcome: Progressing Goal: Knowledge of patient specific risk factors addressed and post discharge goals established will improve Outcome: Progressing   Problem: Coping: Goal: Will verbalize positive feelings about self Outcome: Progressing Goal: Will identify appropriate support needs Outcome: Progressing   Problem: Health Behavior/Discharge Planning: Goal: Ability to manage health-related needs will improve Outcome: Progressing   Problem: Self-Care: Goal: Ability to participate in self-care as condition permits will improve Outcome: Progressing Goal: Verbalization of feelings and concerns over difficulty with self-care will improve Outcome: Progressing Goal: Ability to communicate needs accurately will improve Outcome: Progressing   Problem: Nutrition: Goal: Risk of aspiration will decrease Outcome: Progressing Goal: Dietary intake will improve Outcome: Progressing   Problem: Intracerebral Hemorrhage Tissue Perfusion: Goal: Complications of Intracerebral Hemorrhage will be minimized Outcome: Progressing   Problem: Ischemic Stroke/TIA Tissue Perfusion: Goal: Complications of ischemic stroke/TIA will be minimized Outcome: Progressing   Problem: Spontaneous Subarachnoid Hemorrhage Tissue Perfusion: Goal: Complications of Spontaneous Subarachnoid Hemorrhage will be minimized Outcome: Progressing   

## 2021-07-14 NOTE — TOC CAGE-AID Note (Signed)
Transition of Care [facility]) - CAGE-AID Screening   Patient Details  Name: Ariana [name] MRN: [id number] Date of Birth: [birth year]  Transition of Care [facility] CM/SW Contact:    [place], [place] Phone Number: 07/14/2021, 11:42 AM   Clinical Narrative: Pt is unable to participate in Cage Aid.   Tangie Stay Tarpley-Carter, MSW, LCSW-A Pronouns:  She/Her/Hers [facility] Care Clinical Social Worker Direct Number:  [phone] [email or web address]   CAGE-AID Screening: Substance Abuse Screening unable to be completed due to: : Patient unable to participate             Substance Abuse Education Offered: No

## 2021-07-15 DIAGNOSIS — I619 Nontraumatic intracerebral hemorrhage, unspecified: Secondary | ICD-10-CM | POA: Diagnosis not present

## 2021-07-15 DIAGNOSIS — E039 Hypothyroidism, unspecified: Secondary | ICD-10-CM | POA: Diagnosis not present

## 2021-07-15 DIAGNOSIS — R4182 Altered mental status, unspecified: Secondary | ICD-10-CM | POA: Diagnosis not present

## 2021-07-15 DIAGNOSIS — I609 Nontraumatic subarachnoid hemorrhage, unspecified: Secondary | ICD-10-CM | POA: Diagnosis not present

## 2021-07-15 LAB — CBC
HCT: 35.1 % — ABNORMAL LOW (ref 36.0–46.0)
Hemoglobin: 11 g/dL — ABNORMAL LOW (ref 12.0–15.0)
MCH: 29.6 pg (ref 26.0–34.0)
MCHC: 31.3 g/dL (ref 30.0–36.0)
MCV: 94.6 fL (ref 80.0–100.0)
Platelets: 102 10*3/uL — ABNORMAL LOW (ref 150–400)
RBC: 3.71 MIL/uL — ABNORMAL LOW (ref 3.87–5.11)
RDW: 17.3 % — ABNORMAL HIGH (ref 11.5–15.5)
WBC: 17.4 10*3/uL — ABNORMAL HIGH (ref 4.0–10.5)
nRBC: 0 % (ref 0.0–0.2)

## 2021-07-15 LAB — COMPREHENSIVE METABOLIC PANEL
ALT: 26 U/L (ref 0–44)
AST: 29 U/L (ref 15–41)
Albumin: 3.1 g/dL — ABNORMAL LOW (ref 3.5–5.0)
Alkaline Phosphatase: 92 U/L (ref 38–126)
Anion gap: 6 (ref 5–15)
BUN: 23 mg/dL (ref 8–23)
CO2: 22 mmol/L (ref 22–32)
Calcium: 8.3 mg/dL — ABNORMAL LOW (ref 8.9–10.3)
Chloride: 112 mmol/L — ABNORMAL HIGH (ref 98–111)
Creatinine, Ser: 0.71 mg/dL (ref 0.44–1.00)
GFR, Estimated: >60 mL/min (ref >60)
Glucose, Bld: 81 mg/dL (ref 70–99)
Potassium: 3.3 mmol/L — ABNORMAL LOW (ref 3.5–5.1)
Sodium: 140 mmol/L (ref 135–145)
Total Bilirubin: 0.7 mg/dL (ref 0.3–1.2)
Total Protein: 5.4 g/dL — ABNORMAL LOW (ref 6.5–8.1)

## 2021-07-15 MED ORDER — ALPRAZOLAM 0.5 MG PO TABS: 0.5000 mg | ORAL_TABLET | Freq: Three times a day (TID) | ORAL | 0 refills | Status: DC | PRN

## 2021-07-15 MED ORDER — POTASSIUM CHLORIDE CRYS ER 20 MEQ PO TBCR
40.0000 meq | EXTENDED_RELEASE_TABLET | Freq: Once | ORAL | Status: AC
  Administered 2021-07-15: 40 meq via ORAL
  Filled 2021-07-15: qty 2

## 2021-07-15 MED ORDER — TRAMADOL HCL 50 MG PO TABS: 50.0000 mg | ORAL_TABLET | Freq: Four times a day (QID) | ORAL | 0 refills | Status: DC | PRN

## 2021-07-15 NOTE — TOC Transition Note (Signed)
Transition of Care [facility]) - CM/SW Discharge Note   Patient Details  Name: Ariana [name] MRN: [id number] Date of Birth: 08-06-1933  Transition of Care [facility]) CM/SW Contact:  Geralynn Ochs, LCSW Phone Number: 07/15/2021, 2:24 PM   Clinical Narrative:   Nurse to call report to [phone].    Final next level of care: Skilled Nursing Facility Barriers to Discharge: Barriers Resolved   Patient Goals and CMS Choice Patient states their goals for this hospitalization and ongoing recovery are:: patient unable to participate in goal setting, only oriented to self CMS Medicare.gov Compare Post Acute Care list provided to:: Legal Guardian Choice offered to / list presented to : [place] / [place]  Discharge Placement              Patient chooses bed at:  [facility]) Patient to be transferred to facility by: PTAR   Patient and family notified of of transfer: 07/15/21  Discharge Plan and Services                                     Social Determinants of Health (SDOH) Interventions     Readmission Risk Interventions No flowsheet data found.

## 2021-07-15 NOTE — NC FL2 (Signed)
[facility] [place] LEVEL OF CARE SCREENING TOOL     IDENTIFICATION  Patient Name: Ariana [name] Birthdate: Aug 17, 1933 Sex: female Admission Date (Current Location): 07/13/2021  [facility] and [place] Number:  [employer] and Address:  [facility], [place] [address], [place], [place] [address]      Provider Number: [id number]  Attending Physician Name and Address:  Oswald Hillock, MD  Relative Name and Phone Number:       Current Level of Care: Hospital Recommended Level of Care: [place] Prior Approval Number:    Date Approved/Denied:   PASRR Number:    Discharge Plan: SNF    Current Diagnoses: Patient Active Problem List   Diagnosis Date Noted   Cerebral hemorrhage ([place]) 07/14/2021   SAH (subarachnoid hemorrhage) ([place]) 07/13/2021   Hiatal hernia 04/09/2021   GERD (gastroesophageal reflux disease) 04/09/2021   Normocytic anemia    Skin tear of right hand without complication    Fall    Malnutrition of moderate degree 01/09/2021   Palliative care encounter    Closed displaced fracture of left femoral neck ([place]) 10/30/2020   Rectal bleeding 08/18/2020   Leukocytosis 08/18/2020   Status post reverse total shoulder replacement, left 06/25/2020   Rotator cuff arthropathy, left 06/24/2020   Failure to thrive in adult 02/16/2020   Thrombocytopenia ([place]) 02/15/2020   Pneumonia 07/10/2019   Laceration of left lower leg 04/10/2019   Chronic pain disorder 01/11/2019   OSA (obstructive sleep apnea) 09/28/2018   Ulcer of lower limb, right, limited to breakdown of skin ([place]) 07/22/2018   Hematoma of lower limb, right, initial encounter 07/18/2018   Traumatic hematoma of right lower leg 07/12/2018   Asthma without status asthmaticus 11/25/2016   Late onset Alzheimer's disease without behavioral disturbance ([place]) 12/30/2015   Back muscle spasm 09/09/2015   Clinical depression 04/12/2015   Cellulitis 04/12/2015   Acute ITP  ([place]) 03/15/2015   Chest pain 10/22/2014   Breathlessness on exertion 10/22/2014   Osteopenia 10/18/2014   Benign essential hypertension 06/28/2014   HLD (hyperlipidemia) 06/28/2014   Idiopathic thrombocythemia ([place]) 06/28/2014   Acquired hypothyroidism 06/04/2014   Depressive disorder 06/04/2014   Bursitis, trochanteric 03/27/2014   DDD (degenerative disc disease), lumbar 02/27/2014   Neuritis or radiculitis due to rupture of lumbar intervertebral disc 02/27/2014   Lumbar radiculitis 02/27/2014    Orientation RESPIRATION BLADDER Height & Weight     Self  Normal Incontinent Weight: 132 lb 4.4 oz (60 kg) Height:  5\' 7"  (170.2 cm)  BEHAVIORAL SYMPTOMS/MOOD NEUROLOGICAL BOWEL NUTRITION STATUS      Incontinent Diet (heart healthy)  AMBULATORY STATUS COMMUNICATION OF NEEDS Skin   Extensive Assist Verbally Skin abrasions                       Personal Care Assistance Level of Assistance  Bathing, Feeding, Dressing Bathing Assistance: Maximum assistance Feeding assistance: Limited assistance Dressing Assistance: Maximum assistance     Functional Limitations Info             SPECIAL CARE FACTORS FREQUENCY                       Contractures Contractures Info: Not present    Additional Factors Info  Code Status, Allergies, Psychotropic Code Status Info: DNR Allergies Info: Aspirin, Diazepam, Tetanus Toxoids, Morphine Psychotropic Info: Buspar 7.5 2x/day, Celexa 20mg  daily in the morning, Lamictal 50mg  daily at bed, Xanax  0.5mg  3x/day PRN         Current Medications (07/15/2021):  This is the current hospital active medication list Current Facility-Administered Medications  Medication Dose Route Frequency Provider Last Rate Last Admin   acetaminophen (TYLENOL) tablet 650 mg  650 mg Oral Q6H PRN Barton Dubois, MD   650 mg at 07/14/21 1730   ALPRAZolam Duanne Moron) tablet 0.5 mg  0.5 mg Oral TID PRN Barton Dubois, MD   0.5 mg at 07/14/21 1730   amLODipine  (NORVASC) tablet 10 mg  10 mg Oral Daily Barton Dubois, MD   10 mg at 07/15/21 1119   busPIRone (BUSPAR) tablet 7.5 mg  7.5 mg Oral BID Barton Dubois, MD   7.5 mg at 07/15/21 1119   citalopram (CELEXA) tablet 20 mg  20 mg Oral Darene Lamer, Clifton James, MD   20 mg at 07/15/21 0610   fluticasone (FLONASE) 50 MCG/ACT nasal spray 2 spray  2 spray Each Nare Daily PRN Barton Dubois, MD   2 spray at 07/15/21 1120   fluticasone furoate-vilanterol (BREO ELLIPTA) 200-25 MCG/INH 1 puff  1 puff Inhalation Daily Barton Dubois, MD   1 puff at 07/15/21 0803   gabapentin (NEURONTIN) capsule 200 mg  200 mg Oral QID Barton Dubois, MD   200 mg at 07/15/21 1408   hydrALAZINE (APRESOLINE) injection 10 mg  10 mg Intravenous Q8H PRN Barton Dubois, MD       ipratropium-albuterol (DUONEB) 0.5-2.5 (3) MG/3ML nebulizer solution 3 mL  3 mL Nebulization Q6H PRN Barton Dubois, MD       lamoTRIgine (LAMICTAL) tablet 50 mg  50 mg Oral QHS Barton Dubois, MD   50 mg at 07/14/21 2038   levothyroxine (SYNTHROID) tablet 50 mcg  50 mcg Oral QAC breakfast Barton Dubois, MD   50 mcg at 07/15/21 5465   MEDLINE mouth rinse  15 mL Mouth Rinse BID Oswald Hillock, MD   15 mL at 07/15/21 1120   memantine (NAMENDA) tablet 10 mg  10 mg Oral BID Barton Dubois, MD   10 mg at 07/15/21 1119   metoCLOPramide (REGLAN) injection 5 mg  5 mg Intravenous Q8H PRN Barton Dubois, MD   5 mg at 07/14/21 0534   montelukast (SINGULAIR) tablet 10 mg  10 mg Oral QHS Barton Dubois, MD   10 mg at 07/14/21 2038   naphazoline-glycerin (CLEAR EYES REDNESS) ophth solution 1-2 drop  1-2 drop Both Eyes QID PRN Oswald Hillock, MD       ondansetron [facility]) injection 4 mg  4 mg Intravenous Q6H PRN Barton Dubois, MD   4 mg at 07/14/21 1011   oxybutynin (DITROPAN) tablet 5 mg  5 mg Oral TID Barton Dubois, MD   5 mg at 07/15/21 1119   pantoprazole (PROTONIX) EC tablet 40 mg  40 mg Oral Daily Oswald Hillock, MD   40 mg at 07/15/21 1119     Discharge  Medications: Please see discharge summary for a list of discharge medications.  Relevant Imaging Results:  Relevant Lab Results:   Additional Information SS#: [id number]  Geralynn Ochs, LCSW

## 2021-07-15 NOTE — Progress Notes (Signed)
Patient was been discharged with [facility] to [facility] in [place], [place]. Report has been given to recieving RN. Belongings returned.

## 2021-07-15 NOTE — Discharge Summary (Addendum)
Physician Discharge Summary  Ariana [name] XNA:[id number] DOB: [birth year] DOA: 07/13/2021  PCP: Caprice Renshaw, MD  Admit date: 07/13/2021 Discharge date: 07/15/2021  Time spent: 60 minutes  Recommendations for Outpatient Follow-up:  Recheck CBC in 3 days   Discharge Diagnoses:  Principal Problem:   SAH (subarachnoid hemorrhage) ([place]) Active Problems:   Acquired hypothyroidism   Late onset Alzheimer's disease without behavioral disturbance (HCC)   Thrombocytopenia (HCC)   Leukocytosis   GERD (gastroesophageal reflux disease)   Malnutrition of moderate degree   Cerebral hemorrhage ([place])   Discharge Condition: Stable  Diet recommendation: Heart healthy diet  Filed Weights   07/13/21 0954  Weight: 60 kg    History of present illness:  87 year old female with history of ITP, asthma/COPD, Alzheimer's disease with behavioral disturbance, GERD, hypertension, hyperlipidemia, hypothyroidism, CKD stage IIIb with recent mechanical fall involvement of head trauma came to ED with nausea and vomiting and worsening changes in mentation. On 07/11/2021 patient experienced mechanical fall at the facility when she hit her head.  Imaging showed small subarachnoid hemorrhage and was felt safe to return to facility.  Due to worsening of symptoms patient changes in mental status she was brought back to the hospital.  CT scan of the head showed enlargement of previously seen subarachnoid hemorrhage, no mass-effect no midline shift.  Case was discussed with neurosurgery who recommended transfer to [facility] Course:   Subarachnoid hemorrhage -CT scan head showed larger, now 7 cc left frontal parasagittal hemorrhage -Appears both parenchymal and subarachnoid -Neurosurgery was consulted -Dr. Arnoldo Morale reviewed the films, recommends no intervention   Nausea and vomiting -Patient had nausea and vomiting yesterday -Resolved -Likely from increased intracranial pressure     History of ITP -Platelet counts are normal   Leukocytosis -Unclear etiology, likely reactive -- Denies any symptoms -She is afebrile -Recommend getting CBC in 3 days to check leukocytosis   History of asthma/COPD -No wheezing -Continue Advair   Alzheimer's dementia -No behavior disturbance at this time -Continue Namenda, Lamictal, Celexa, BuSpar, as needed Xanax   Hypothyroidism -Continue Synthroid   GERD -Continue PPI    Procedures None  Consultations: Phone consultation neuro surgery  Discharge Exam: Vitals:   07/15/21 0504 07/15/21 0804  BP: 125/63   Pulse: 77 78  Resp: 18 16  Temp: 97.6 F (36.4 C)   SpO2: 99%     General: Appears in no acute distress Cardiovascular: S1-S2, regular Respiratory: Clear to auscultation bilaterally  Discharge Instructions   Discharge Instructions     Diet - low sodium heart healthy   Complete by: As directed    Increase activity slowly   Complete by: As directed       Allergies as of 07/15/2021       Reactions   Aspirin Other (See Comments)   Upset stomach   Diazepam Itching   sneezing   Tetanus Toxoids Swelling   Localized superficial swelling of skin   Morphine Itching, Rash        Medication List     STOP taking these medications    fluticasone 50 MCG/ACT nasal spray Commonly known as: FLONASE   montelukast 10 MG tablet Commonly known as: SINGULAIR   QUEtiapine 25 MG tablet Commonly known as: SEROQUEL       TAKE these medications    acetaminophen 325 MG tablet Commonly known as: TYLENOL Take 2 tablets (650 mg total) by mouth every 6 (six) hours as needed for mild pain (or Fever >/= 101).  ALPRAZolam 0.5 MG tablet Commonly known as: XANAX Take 1 tablet (0.5 mg total) by mouth 3 (three) times daily as needed for anxiety.   amLODipine 10 MG tablet Commonly known as: NORVASC Take 1 tablet (10 mg total) by mouth daily.   busPIRone 15 MG tablet Commonly known as: BUSPAR Take 7.5 mg  by mouth 2 (two) times daily. What changed: Another medication with the same name was removed. Continue taking this medication, and follow the directions you see here.   citalopram 20 MG tablet Commonly known as: CELEXA Take 1 tablet (20 mg total) by mouth every morning.   Cyanocobalamin 1000 MCG Tbcr Take 1,000 mcg by mouth daily.   diclofenac Sodium 1 % Gel Commonly known as: VOLTAREN Apply 2 g topically in the morning and at bedtime. Both knees   Fluticasone-Salmeterol 250-50 MCG/DOSE Aepb Commonly known as: ADVAIR Inhale 1 puff into the lungs 2 (two) times daily as needed (shortness of breath).   gabapentin 100 MG capsule Commonly known as: NEURONTIN Take 2 capsules (200 mg total) by mouth 4 (four) times daily.   iron polysaccharides 150 MG capsule Commonly known as: NIFEREX Take 1 capsule (150 mg total) by mouth daily.   LamoTRIgine 50 MG Tb24 24 hour tablet Take 50 mg by mouth at bedtime. What changed: Another medication with the same name was removed. Continue taking this medication, and follow the directions you see here.   levothyroxine 50 MCG tablet Commonly known as: SYNTHROID Take 50 mcg by mouth daily before breakfast. Take 30 to 60 minutes before breakfast.   loratadine 10 MG tablet Commonly known as: CLARITIN Take 1 tablet (10 mg total) by mouth daily.   memantine 10 MG tablet Commonly known as: NAMENDA Take 10 mg by mouth 2 (two) times daily.   Multi-Vitamin tablet Take 1 tablet by mouth daily.   oxybutynin 5 MG 24 hr tablet Commonly known as: DITROPAN-XL Take 5 mg by mouth at bedtime. What changed: Another medication with the same name was removed. Continue taking this medication, and follow the directions you see here.   pantoprazole 40 MG tablet Commonly known as: PROTONIX Take 40 mg by mouth daily.   rosuvastatin 5 MG tablet Commonly known as: CRESTOR Take 5 mg by mouth every evening.   traMADol 50 MG tablet Commonly known as:  ULTRAM Take 1 tablet (50 mg total) by mouth every 6 (six) hours as needed for moderate pain.   Vitamin D 50 MCG (2000 UT) tablet Take 2,000 Units by mouth daily.       Allergies  Allergen Reactions   Aspirin Other (See Comments)    Upset stomach   Diazepam Itching    sneezing   Tetanus Toxoids Swelling    Localized superficial swelling of skin   Morphine Itching and Rash      The results of significant diagnostics from this hospitalization (including imaging, microbiology, ancillary and laboratory) are listed below for reference.    Significant Diagnostic Studies: DG Chest 1 View  Result Date: 07/11/2021 CLINICAL DATA:  Recent fall with left-sided pain, initial encounter EXAM: PORTABLE CHEST 1 VIEW COMPARISON:  05/04/2021 FINDINGS: Cardiac shadow is mildly prominent but stable. Aortic calcifications are noted. The lungs are well aerated bilaterally. No focal infiltrate or sizable effusion is seen. Postsurgical changes in the left shoulder are noted. No acute bony abnormality is seen. IMPRESSION: No acute abnormality noted. Electronically Signed   By: Inez Catalina M.D.   On: 07/11/2021 19:31   CT Head Wo  Contrast  Result Date: 07/13/2021 CLINICAL DATA:  Mental status change.  Recent hemorrhage EXAM: CT HEAD WITHOUT CONTRAST TECHNIQUE: Contiguous axial images were obtained from the base of the skull through the vertex without intravenous contrast. COMPARISON:  07/11/2021 FINDINGS: Brain: Larger anterior and parasagittal left frontal hematoma along the surface of the brain which measures 20 x 21 x 3.6 cm. Location appears intraparenchymal, although on prior there was clearly a subarachnoid appearance. No adjacent edema or significant mass effect in the setting of atrophy. Chronic small vessel ischemia in the hemispheric white matter which is age congruent. No visible cortical infarct. Reportedly there was a hemorrhage precipitating the initial bleed. Vascular: Atheromatous calcification  Skull: Negative Sinuses/Orbits: No visible injury.  Bilateral cataract resection. Other: Critical Value/emergent results were called by telephone at the time of interpretation on 07/13/2021 at 10:58 am to provider [facility] , who verbally acknowledged these results. IMPRESSION: Larger and now 7 cc left frontal parasagittal hemorrhage which appears both parenchymal and subarachnoid. No edema or significant mass effect. Electronically Signed   By: Jorje Guild M.D.   On: 07/13/2021 10:59   CT HEAD WO CONTRAST  Result Date: 07/11/2021 CLINICAL DATA:  Recent fall with facial swelling, initial encounter EXAM: CT HEAD WITHOUT CONTRAST TECHNIQUE: Contiguous axial images were obtained from the base of the skull through the vertex without intravenous contrast. COMPARISON:  02/03/2021 FINDINGS: Brain: Chronic atrophic and ischemic changes are again identified. There is minimal subarachnoid hemorrhage along the medial aspect of the left frontal lobe best seen on images 16 through 18 of series 2. No other focal area of hemorrhage is identified. Vascular: No hyperdense vessel or unexpected calcification. Skull: Normal. Negative for fracture or focal lesion. Sinuses/Orbits: No acute finding. Other: Mild soft tissue swelling is noted over the left supraorbital region consistent with the recent injury. IMPRESSION: Mild subarachnoid hemorrhage in the medial aspect of the left frontal lobe. Chronic atrophic and ischemic changes. Left supraorbital soft tissue swelling consistent with the recent injury. Critical Value/emergent results were called by telephone at the time of interpretation on 07/11/2021 at 7:18 pm to Dr. Varney Biles , who verbally acknowledged these results. Electronically Signed   By: Inez Catalina M.D.   On: 07/11/2021 19:20   CT Cervical Spine Wo Contrast  Result Date: 07/11/2021 CLINICAL DATA:  Neck trauma.  Fall. EXAM: CT CERVICAL SPINE WITHOUT CONTRAST TECHNIQUE: Multidetector CT imaging of the  cervical spine was performed without intravenous contrast. Multiplanar CT image reconstructions were also generated. COMPARISON:  Cervical spine CT 02/03/2021.  Is this FINDINGS: Alignment: There is mild chronic anterolisthesis at C2-C3 and C7-T1. Alignment is otherwise anatomic. Skull base and vertebrae: Visualized skull base is intact. No acute fractures. No focal osseous lesions. Soft tissues and spinal canal: No prevertebral fluid or swelling. No visible canal hematoma. Disc levels: There is intervertebral disc space narrowing throughout the cervical spine which is severe at C4-C5, C5-C6, C6-C7 and C7-T1. Findings are similar to the prior examination. There is no severe central canal or neural foraminal stenosis at any level. Upper chest: Negative. Other: None. IMPRESSION: 1. No acute fracture or traumatic subluxation. 2. Stable multilevel degenerative changes. Electronically Signed   By: Ronney Asters M.D.   On: 07/11/2021 20:20   DG Hip Unilat With Pelvis 2-3 Views Left  Result Date: 07/11/2021 CLINICAL DATA:  Recent fall with left hip pain, initial encounter EXAM: DG HIP (WITH OR WITHOUT PELVIS) 2-3V LEFT COMPARISON:  02/03/2021 FINDINGS: Pelvic ring is intact. Postsurgical changes  are noted in the lumbosacral spine as well as in the proximal left femur. There is been further progressive collapse of the left femoral head when compared with the prior exam. This is of uncertain chronicity but new from the prior exam of 02/03/2021. The surgically placed screws appear somewhat lucent when compared with the prior exam this is likely related to the head collapse. Mild degenerative changes of the hip joint are seen. IMPRESSION: Postsurgical changes in the proximal left femur. Progressive collapse of the left femoral head is noted with some mild displacement of previously placed surgical screws. The collapse of the femoral head is of uncertain chronicity Electronically Signed   By: Inez Catalina M.D.   On:  07/11/2021 19:33    Microbiology: Recent Results (from the past 240 hour(s))  Resp Panel by RT-PCR (Flu A&B, Covid) Nasopharyngeal Swab     Status: None   Collection Time: 07/13/21 12:26 PM   Specimen: Nasopharyngeal Swab; Nasopharyngeal(NP) swabs in vial transport medium  Result Value Ref Range Status   SARS Coronavirus 2 by RT PCR NEGATIVE NEGATIVE Final    Comment: (NOTE) SARS-CoV-2 target nucleic acids are NOT DETECTED.  The SARS-CoV-2 RNA is generally detectable in upper respiratory specimens during the acute phase of infection. The lowest concentration of SARS-CoV-2 viral copies this assay can detect is 138 copies/mL. A negative result does not preclude SARS-Cov-2 infection and should not be used as the sole basis for treatment or other patient management decisions. A negative result may occur with  improper specimen collection/handling, submission of specimen other than nasopharyngeal swab, presence of viral mutation(s) within the areas targeted by this assay, and inadequate number of viral copies(<138 copies/mL). A negative result must be combined with clinical observations, patient history, and epidemiological information. The expected result is Negative.  Fact Sheet for Patients:  EntrepreneurPulse.com.au  Fact Sheet for Healthcare Providers:  IncredibleEmployment.be  This test is no t yet approved or cleared by the [place] FDA and  has been authorized for detection and/or diagnosis of SARS-CoV-2 by FDA under an Emergency Use Authorization (EUA). This EUA will remain  in effect (meaning this test can be used) for the duration of the COVID-19 declaration under Section 564(b)(1) of the Act, 21 U.S.C.section 360bbb-3(b)(1), unless the authorization is terminated  or revoked sooner.       Influenza A by PCR NEGATIVE NEGATIVE Final   Influenza B by PCR NEGATIVE NEGATIVE Final    Comment: (NOTE) The Xpert Xpress  SARS-CoV-2/FLU/RSV plus assay is intended as an aid in the diagnosis of influenza from Nasopharyngeal swab specimens and should not be used as a sole basis for treatment. Nasal washings and aspirates are unacceptable for Xpert Xpress SARS-CoV-2/FLU/RSV testing.  Fact Sheet for Patients: EntrepreneurPulse.com.au  Fact Sheet for Healthcare Providers: IncredibleEmployment.be  This test is not yet approved or cleared by the [place] FDA and has been authorized for detection and/or diagnosis of SARS-CoV-2 by FDA under an Emergency Use Authorization (EUA). This EUA will remain in effect (meaning this test can be used) for the duration of the COVID-19 declaration under Section 564(b)(1) of the Act, 21 U.S.C. section 360bbb-3(b)(1), unless the authorization is terminated or revoked.  Performed at [facility], [address] [place], [place] [address]      Labs: Basic Metabolic Panel: Recent Labs  Lab 07/11/21 1840 07/13/21 1011 07/15/21 0310  NA 141 140 140  K 4.0 3.3* 3.3*  CL 104 102 112*  CO2 27 27 22   GLUCOSE 87  107* 81  BUN 27* 41* 23  CREATININE 0.86 1.13* 0.71  CALCIUM 9.4 9.4 8.3*   Liver Function Tests: Recent Labs  Lab 07/11/21 1840 07/15/21 0310  AST 29 29  ALT 27 26  ALKPHOS 152* 92  BILITOT 0.5 0.7  PROT 7.4 5.4*  ALBUMIN 4.3 3.1*   No results for input(s): LIPASE, AMYLASE in the last 168 hours. No results for input(s): AMMONIA in the last 168 hours. CBC: Recent Labs  Lab 07/11/21 1840 07/13/21 1011 07/15/21 0310  WBC 15.6* 20.2* 17.4*  NEUTROABS 10.9* 16.0*  --   HGB 14.4 14.6 11.0*  HCT 43.8 44.7 35.1*  MCV 92.0 92.9 94.6  PLT 351 298 102*       Signed:  Oswald Hillock MD.  [facility] 07/15/2021, 1:03 PM

## 2021-07-15 NOTE — Progress Notes (Signed)
Report called and given to [place] [facility] in [place]. PTAR has been called

## 2021-07-15 NOTE — Plan of Care (Signed)
  Problem: Education: Goal: Knowledge of disease or condition will improve Outcome: Adequate for Discharge Goal: Knowledge of secondary prevention will improve Outcome: Adequate for Discharge Goal: Knowledge of patient specific risk factors addressed and post discharge goals established will improve Outcome: Adequate for Discharge   Problem: Coping: Goal: Will verbalize positive feelings about self Outcome: Adequate for Discharge Goal: Will identify appropriate support needs Outcome: Adequate for Discharge   Problem: Health Behavior/Discharge Planning: Goal: Ability to manage health-related needs will improve Outcome: Adequate for Discharge   Problem: Self-Care: Goal: Ability to participate in self-care as condition permits will improve Outcome: Adequate for Discharge Goal: Verbalization of feelings and concerns over difficulty with self-care will improve Outcome: Adequate for Discharge Goal: Ability to communicate needs accurately will improve Outcome: Adequate for Discharge   Problem: Nutrition: Goal: Risk of aspiration will decrease Outcome: Adequate for Discharge Goal: Dietary intake will improve Outcome: Adequate for Discharge   Problem: Intracerebral Hemorrhage Tissue Perfusion: Goal: Complications of Intracerebral Hemorrhage will be minimized Outcome: Adequate for Discharge   Problem: Ischemic Stroke/TIA Tissue Perfusion: Goal: Complications of ischemic stroke/TIA will be minimized Outcome: Adequate for Discharge   Problem: Spontaneous Subarachnoid Hemorrhage Tissue Perfusion: Goal: Complications of Spontaneous Subarachnoid Hemorrhage will be minimized Outcome: Adequate for Discharge   

## 2021-07-15 NOTE — Consult Note (Signed)
   [name] Acute Care Inpatient Consult   07/15/2021  [name] 1933/02/28 [id number]  [facility] [ACO] Patient:  [name] Medicare   Patient screened for hospitalization with noted extreme high risk score [34%] for unplanned readmission risk a  Review of patient's medical record reveals patient is a long term care resident at [facility].   Plan: No follow up as patient needs are to be met at the long term care facility.  For questions contact:   Natividad Brood, RN BSN [facility] Liaison  [phone] business mobile phone Toll free office [phone]  Fax number: [phone] [email or web address]

## 2021-07-17 DIAGNOSIS — F32A Depression, unspecified: Secondary | ICD-10-CM | POA: Diagnosis not present

## 2021-07-17 DIAGNOSIS — E039 Hypothyroidism, unspecified: Secondary | ICD-10-CM | POA: Diagnosis not present

## 2021-07-17 DIAGNOSIS — M6281 Muscle weakness (generalized): Secondary | ICD-10-CM | POA: Diagnosis not present

## 2021-07-17 DIAGNOSIS — F419 Anxiety disorder, unspecified: Secondary | ICD-10-CM | POA: Diagnosis not present

## 2021-07-17 DIAGNOSIS — F0390 Unspecified dementia without behavioral disturbance: Secondary | ICD-10-CM | POA: Diagnosis not present

## 2021-07-17 DIAGNOSIS — G629 Polyneuropathy, unspecified: Secondary | ICD-10-CM | POA: Diagnosis not present

## 2021-07-17 DIAGNOSIS — R52 Pain, unspecified: Secondary | ICD-10-CM | POA: Diagnosis not present

## 2021-07-17 DIAGNOSIS — S066X0D Traumatic subarachnoid hemorrhage without loss of consciousness, subsequent encounter: Secondary | ICD-10-CM | POA: Diagnosis not present

## 2021-07-17 DIAGNOSIS — R41841 Cognitive communication deficit: Secondary | ICD-10-CM | POA: Diagnosis not present

## 2021-07-17 DIAGNOSIS — R2689 Other abnormalities of gait and mobility: Secondary | ICD-10-CM | POA: Diagnosis not present

## 2021-07-17 DIAGNOSIS — Z79899 Other long term (current) drug therapy: Secondary | ICD-10-CM | POA: Diagnosis not present

## 2021-07-17 DIAGNOSIS — M25562 Pain in left knee: Secondary | ICD-10-CM | POA: Diagnosis not present

## 2021-07-18 DIAGNOSIS — M6281 Muscle weakness (generalized): Secondary | ICD-10-CM | POA: Diagnosis not present

## 2021-07-18 DIAGNOSIS — S066X0D Traumatic subarachnoid hemorrhage without loss of consciousness, subsequent encounter: Secondary | ICD-10-CM | POA: Diagnosis not present

## 2021-07-18 DIAGNOSIS — R2689 Other abnormalities of gait and mobility: Secondary | ICD-10-CM | POA: Diagnosis not present

## 2021-07-18 DIAGNOSIS — R41841 Cognitive communication deficit: Secondary | ICD-10-CM | POA: Diagnosis not present

## 2021-07-21 ENCOUNTER — Other Ambulatory Visit: Payer: Self-pay

## 2021-07-21 ENCOUNTER — Emergency Department (HOSPITAL_COMMUNITY): Payer: Medicare HMO

## 2021-07-21 ENCOUNTER — Encounter (HOSPITAL_COMMUNITY): Payer: Self-pay | Admitting: Emergency Medicine

## 2021-07-21 ENCOUNTER — Inpatient Hospital Stay (HOSPITAL_COMMUNITY)
Admission: EM | Admit: 2021-07-21 | Discharge: 2021-07-30 | DRG: 480 | Disposition: A | Discharge status: Skilled Nursing Facility | Payer: Medicare HMO | Source: Skilled Nursing Facility | Attending: Internal Medicine | Admitting: Internal Medicine

## 2021-07-21 DIAGNOSIS — E876 Hypokalemia: Secondary | ICD-10-CM | POA: Diagnosis present

## 2021-07-21 DIAGNOSIS — F419 Anxiety disorder, unspecified: Secondary | ICD-10-CM | POA: Diagnosis not present

## 2021-07-21 DIAGNOSIS — I10 Essential (primary) hypertension: Secondary | ICD-10-CM | POA: Diagnosis not present

## 2021-07-21 DIAGNOSIS — Z9081 Acquired absence of spleen: Secondary | ICD-10-CM

## 2021-07-21 DIAGNOSIS — E7800 Pure hypercholesterolemia, unspecified: Secondary | ICD-10-CM | POA: Diagnosis not present

## 2021-07-21 DIAGNOSIS — J441 Chronic obstructive pulmonary disease with (acute) exacerbation: Secondary | ICD-10-CM | POA: Diagnosis not present

## 2021-07-21 DIAGNOSIS — M810 Age-related osteoporosis without current pathological fracture: Secondary | ICD-10-CM | POA: Diagnosis present

## 2021-07-21 DIAGNOSIS — S72011A Unspecified intracapsular fracture of right femur, initial encounter for closed fracture: Secondary | ICD-10-CM | POA: Diagnosis not present

## 2021-07-21 DIAGNOSIS — F0284 Dementia in other diseases classified elsewhere, unspecified severity, with anxiety: Secondary | ICD-10-CM | POA: Diagnosis present

## 2021-07-21 DIAGNOSIS — D693 Immune thrombocytopenic purpura: Secondary | ICD-10-CM | POA: Diagnosis present

## 2021-07-21 DIAGNOSIS — D72829 Elevated white blood cell count, unspecified: Secondary | ICD-10-CM | POA: Diagnosis present

## 2021-07-21 DIAGNOSIS — M1990 Unspecified osteoarthritis, unspecified site: Secondary | ICD-10-CM | POA: Diagnosis present

## 2021-07-21 DIAGNOSIS — E039 Hypothyroidism, unspecified: Secondary | ICD-10-CM | POA: Diagnosis present

## 2021-07-21 DIAGNOSIS — Z79899 Other long term (current) drug therapy: Secondary | ICD-10-CM

## 2021-07-21 DIAGNOSIS — M25551 Pain in right hip: Secondary | ICD-10-CM | POA: Diagnosis not present

## 2021-07-21 DIAGNOSIS — G309 Alzheimer's disease, unspecified: Secondary | ICD-10-CM | POA: Diagnosis not present

## 2021-07-21 DIAGNOSIS — Z20822 Contact with and (suspected) exposure to covid-19: Secondary | ICD-10-CM | POA: Diagnosis not present

## 2021-07-21 DIAGNOSIS — R41841 Cognitive communication deficit: Secondary | ICD-10-CM | POA: Diagnosis not present

## 2021-07-21 DIAGNOSIS — Z885 Allergy status to narcotic agent status: Secondary | ICD-10-CM

## 2021-07-21 DIAGNOSIS — Z823 Family history of stroke: Secondary | ICD-10-CM

## 2021-07-21 DIAGNOSIS — Z90710 Acquired absence of both cervix and uterus: Secondary | ICD-10-CM

## 2021-07-21 DIAGNOSIS — J449 Chronic obstructive pulmonary disease, unspecified: Secondary | ICD-10-CM | POA: Diagnosis not present

## 2021-07-21 DIAGNOSIS — Z96612 Presence of left artificial shoulder joint: Secondary | ICD-10-CM | POA: Diagnosis present

## 2021-07-21 DIAGNOSIS — D62 Acute posthemorrhagic anemia: Secondary | ICD-10-CM | POA: Diagnosis not present

## 2021-07-21 DIAGNOSIS — F0390 Unspecified dementia without behavioral disturbance: Secondary | ICD-10-CM | POA: Diagnosis not present

## 2021-07-21 DIAGNOSIS — G4733 Obstructive sleep apnea (adult) (pediatric): Secondary | ICD-10-CM | POA: Diagnosis present

## 2021-07-21 DIAGNOSIS — F32A Depression, unspecified: Secondary | ICD-10-CM | POA: Diagnosis not present

## 2021-07-21 DIAGNOSIS — I447 Left bundle-branch block, unspecified: Secondary | ICD-10-CM | POA: Diagnosis present

## 2021-07-21 DIAGNOSIS — W1830XA Fall on same level, unspecified, initial encounter: Secondary | ICD-10-CM | POA: Diagnosis present

## 2021-07-21 DIAGNOSIS — G8929 Other chronic pain: Secondary | ICD-10-CM | POA: Diagnosis present

## 2021-07-21 DIAGNOSIS — Z886 Allergy status to analgesic agent status: Secondary | ICD-10-CM

## 2021-07-21 DIAGNOSIS — R2689 Other abnormalities of gait and mobility: Secondary | ICD-10-CM | POA: Diagnosis not present

## 2021-07-21 DIAGNOSIS — S066X0D Traumatic subarachnoid hemorrhage without loss of consciousness, subsequent encounter: Secondary | ICD-10-CM | POA: Diagnosis not present

## 2021-07-21 DIAGNOSIS — G2581 Restless legs syndrome: Secondary | ICD-10-CM | POA: Diagnosis present

## 2021-07-21 DIAGNOSIS — Z6820 Body mass index (BMI) 20.0-20.9, adult: Secondary | ICD-10-CM

## 2021-07-21 DIAGNOSIS — D696 Thrombocytopenia, unspecified: Secondary | ICD-10-CM

## 2021-07-21 DIAGNOSIS — S0003XA Contusion of scalp, initial encounter: Secondary | ICD-10-CM | POA: Diagnosis present

## 2021-07-21 DIAGNOSIS — K219 Gastro-esophageal reflux disease without esophagitis: Secondary | ICD-10-CM | POA: Diagnosis present

## 2021-07-21 DIAGNOSIS — E43 Unspecified severe protein-calorie malnutrition: Secondary | ICD-10-CM

## 2021-07-21 DIAGNOSIS — Z66 Do not resuscitate: Secondary | ICD-10-CM | POA: Diagnosis not present

## 2021-07-21 DIAGNOSIS — R4182 Altered mental status, unspecified: Secondary | ICD-10-CM | POA: Diagnosis not present

## 2021-07-21 DIAGNOSIS — Z419 Encounter for procedure for purposes other than remedying health state, unspecified: Secondary | ICD-10-CM

## 2021-07-21 DIAGNOSIS — T148XXA Other injury of unspecified body region, initial encounter: Secondary | ICD-10-CM

## 2021-07-21 DIAGNOSIS — Z79890 Hormone replacement therapy (postmenopausal): Secondary | ICD-10-CM

## 2021-07-21 DIAGNOSIS — Z95810 Presence of automatic (implantable) cardiac defibrillator: Secondary | ICD-10-CM | POA: Diagnosis not present

## 2021-07-21 DIAGNOSIS — S72001A Fracture of unspecified part of neck of right femur, initial encounter for closed fracture: Secondary | ICD-10-CM

## 2021-07-21 DIAGNOSIS — I609 Nontraumatic subarachnoid hemorrhage, unspecified: Secondary | ICD-10-CM

## 2021-07-21 DIAGNOSIS — W19XXXA Unspecified fall, initial encounter: Secondary | ICD-10-CM | POA: Diagnosis not present

## 2021-07-21 DIAGNOSIS — R9431 Abnormal electrocardiogram [ECG] [EKG]: Secondary | ICD-10-CM

## 2021-07-21 DIAGNOSIS — Z888 Allergy status to other drugs, medicaments and biological substances status: Secondary | ICD-10-CM

## 2021-07-21 DIAGNOSIS — M6281 Muscle weakness (generalized): Secondary | ICD-10-CM | POA: Diagnosis not present

## 2021-07-21 DIAGNOSIS — D72825 Bandemia: Secondary | ICD-10-CM | POA: Diagnosis not present

## 2021-07-21 DIAGNOSIS — E440 Moderate protein-calorie malnutrition: Secondary | ICD-10-CM | POA: Diagnosis not present

## 2021-07-21 DIAGNOSIS — E785 Hyperlipidemia, unspecified: Secondary | ICD-10-CM | POA: Diagnosis not present

## 2021-07-21 DIAGNOSIS — R279 Unspecified lack of coordination: Secondary | ICD-10-CM | POA: Diagnosis not present

## 2021-07-21 DIAGNOSIS — R739 Hyperglycemia, unspecified: Secondary | ICD-10-CM | POA: Diagnosis present

## 2021-07-21 DIAGNOSIS — E871 Hypo-osmolality and hyponatremia: Secondary | ICD-10-CM | POA: Diagnosis not present

## 2021-07-21 DIAGNOSIS — Z79891 Long term (current) use of opiate analgesic: Secondary | ICD-10-CM

## 2021-07-21 DIAGNOSIS — Z743 Need for continuous supervision: Secondary | ICD-10-CM | POA: Diagnosis not present

## 2021-07-21 DIAGNOSIS — R410 Disorientation, unspecified: Secondary | ICD-10-CM | POA: Diagnosis not present

## 2021-07-21 DIAGNOSIS — D649 Anemia, unspecified: Secondary | ICD-10-CM | POA: Diagnosis not present

## 2021-07-21 DIAGNOSIS — R404 Transient alteration of awareness: Secondary | ICD-10-CM | POA: Diagnosis not present

## 2021-07-21 LAB — CBC WITH DIFFERENTIAL/PLATELET
Abs Immature Granulocytes: 0.16 10*3/uL — ABNORMAL HIGH (ref 0.00–0.07)
Basophils Absolute: 0.1 10*3/uL (ref 0.0–0.1)
Basophils Relative: 1 %
Eosinophils Absolute: 0.2 10*3/uL (ref 0.0–0.5)
Eosinophils Relative: 1 %
HCT: 38.7 % (ref 36.0–46.0)
Hemoglobin: 13 g/dL (ref 12.0–15.0)
Immature Granulocytes: 1 %
Lymphocytes Relative: 13 %
Lymphs Abs: 2.4 10*3/uL (ref 0.7–4.0)
MCH: 31.2 pg (ref 26.0–34.0)
MCHC: 33.6 g/dL (ref 30.0–36.0)
MCV: 92.8 fL (ref 80.0–100.0)
Monocytes Absolute: 1 10*3/uL (ref 0.1–1.0)
Monocytes Relative: 5 %
Neutro Abs: 15 10*3/uL — ABNORMAL HIGH (ref 1.7–7.7)
Neutrophils Relative %: 79 %
Platelets: 12 10*3/uL — CL (ref 150–400)
RBC: 4.17 MIL/uL (ref 3.87–5.11)
RDW: 16.8 % — ABNORMAL HIGH (ref 11.5–15.5)
WBC: 18.8 10*3/uL — ABNORMAL HIGH (ref 4.0–10.5)
nRBC: 0 % (ref 0.0–0.2)

## 2021-07-21 LAB — COMPREHENSIVE METABOLIC PANEL
ALT: 24 U/L (ref 0–44)
AST: 25 U/L (ref 15–41)
Albumin: 3.7 g/dL (ref 3.5–5.0)
Alkaline Phosphatase: 106 U/L (ref 38–126)
Anion gap: 8 (ref 5–15)
BUN: 16 mg/dL (ref 8–23)
CO2: 28 mmol/L (ref 22–32)
Calcium: 8.5 mg/dL — ABNORMAL LOW (ref 8.9–10.3)
Chloride: 103 mmol/L (ref 98–111)
Creatinine, Ser: 0.88 mg/dL (ref 0.44–1.00)
GFR, Estimated: >60 mL/min (ref >60)
Glucose, Bld: 136 mg/dL — ABNORMAL HIGH (ref 70–99)
Potassium: 3 mmol/L — ABNORMAL LOW (ref 3.5–5.1)
Sodium: 139 mmol/L (ref 135–145)
Total Bilirubin: 0.7 mg/dL (ref 0.3–1.2)
Total Protein: 7 g/dL (ref 6.5–8.1)

## 2021-07-21 LAB — RESP PANEL BY RT-PCR (FLU A&B, COVID) ARPGX2
Influenza A by PCR: NEGATIVE
Influenza B by PCR: NEGATIVE
SARS Coronavirus 2 by RT PCR: NEGATIVE

## 2021-07-21 MED ORDER — ROMIPLOSTIM 250 MCG ~~LOC~~ SOLR
250.0000 ug | Freq: Once | SUBCUTANEOUS | Status: AC
  Administered 2021-07-22: 250 ug via SUBCUTANEOUS
  Filled 2021-07-21: qty 0.5

## 2021-07-21 MED ORDER — POTASSIUM CHLORIDE 10 MEQ/100ML IV SOLN
10.0000 meq | INTRAVENOUS | Status: AC
  Administered 2021-07-21 (×2): 10 meq via INTRAVENOUS
  Filled 2021-07-21 (×2): qty 100

## 2021-07-21 MED ORDER — LORAZEPAM 2 MG/ML IJ SOLN
0.5000 mg | Freq: Once | INTRAMUSCULAR | Status: AC
  Administered 2021-07-21: 0.5 mg via INTRAVENOUS
  Filled 2021-07-21: qty 1

## 2021-07-21 MED ORDER — ALPRAZOLAM 0.5 MG PO TABS: 0.5000 mg | ORAL_TABLET | Freq: Once | ORAL | Status: DC

## 2021-07-21 MED ORDER — LORAZEPAM 2 MG/ML IJ SOLN
0.2500 mg | Freq: Once | INTRAMUSCULAR | Status: AC
  Administered 2021-07-21: 0.25 mg via INTRAVENOUS
  Filled 2021-07-21: qty 1

## 2021-07-21 MED ORDER — IMMUNE GLOBULIN (HUMAN) 10 GM/100ML IV SOLN
1.0000 g/kg | INTRAVENOUS | Status: AC
  Administered 2021-07-21 – 2021-07-22 (×2): 60 g via INTRAVENOUS
  Filled 2021-07-21 (×3): qty 600

## 2021-07-21 MED ORDER — POTASSIUM CHLORIDE CRYS ER 20 MEQ PO TBCR: 40.0000 meq | EXTENDED_RELEASE_TABLET | Freq: Once | ORAL | Status: DC

## 2021-07-21 NOTE — ED Notes (Signed)
Date and time results received: 07/21/21 8:07 PM  Test: Platelets  Critical Value: 12  Name of Provider Notified: Dr. Roderic Palau   Orders Received? Or Actions Taken?: Acknowledged

## 2021-07-21 NOTE — ED Notes (Signed)
Pt tolerating IVIG at this time. Will increase dose per parameters

## 2021-07-21 NOTE — ED Provider Notes (Signed)
Patient signed out to me at shift change from [facility], [place].  Pending CT of her right hip status post fall at her nursing home.  Plain films were inconclusive, CT confirms that she has a right subcapital femoral neck fracture.  She also has evidence of possible mild new subarachnoid hemorrhage, although she was recently diagnosed and admitted for this from a fall she initially had on September 30.  Discussed todays CT head findings with Dr. Christella Noa.  States findings today have no clinical significance and does not need to be at [facility] from a neurosurgical standpoint.  8:10 PM Labs returning - pt has a significant thrombocytopenia today at 12, was 102  just 6 days ago. Has history of ITP.   Call placed to Dr. Delton Coombes for guidance. Pt will probably need admission at [facility] given this new finding.  Recommended IVIG 1 gm/kg and Nplate - 409 mcg [place] to stimulate platelet production.  8:27 PM Discussed with Dr. Marlou Sa of ortho.  He will consult pt and will probably plan for Wednesday surgery.   9:35 PM Discussed with Dr. Josephine Cables who accepts pt for admission.  Also added potassium supplementation po and IV.      CT HEAD WO CONTRAST (5MM)  Result Date: 07/21/2021 CLINICAL DATA:  Fall, confusion EXAM: CT HEAD WITHOUT CONTRAST TECHNIQUE: Contiguous axial images were obtained from the base of the skull through the vertex without intravenous contrast. COMPARISON:  07/13/2021 FINDINGS: Brain: Again seen is the left anterior frontal intraparenchymal hemorrhage measuring 2.0 x 1.6 cm compared to 2.1 x 2.0 cm previously. Probable small amount of subarachnoid blood within the left sylvian fissure posteriorly, not definitively seen on prior study. No acute infarction. No mass effect or midline shift. No hydrocephalus. Extensive chronic small vessel disease throughout the deep white matter. Diffuse cerebral atrophy. Vascular: No hyperdense vessel or unexpected calcification. Skull: No acute calvarial abnormality.  Sinuses/Orbits: No acute findings Other: Soft tissue swelling in the right scalp. IMPRESSION: Slight decreased size of the anterior left frontal intraparenchymal hematoma since prior study. Focus of high density in the left sylvian fissure could reflect small acute subarachnoid hemorrhage, not definitively seen on prior study. These results were called by telephone at the time of interpretation on 07/21/2021 at 6:10 pm to provider Dr. Roderic Palau, Who verbally acknowledged these results. Electronically Signed   By: Rolm Baptise M.D.   On: 07/21/2021 18:13   CT Head Wo Contrast  Result Date: 07/13/2021 CLINICAL DATA:  Mental status change.  Recent hemorrhage EXAM: CT HEAD WITHOUT CONTRAST TECHNIQUE: Contiguous axial images were obtained from the base of the skull through the vertex without intravenous contrast. COMPARISON:  07/11/2021 FINDINGS: Brain: Larger anterior and parasagittal left frontal hematoma along the surface of the brain which measures 20 x 21 x 3.6 cm. Location appears intraparenchymal, although on prior there was clearly a subarachnoid appearance. No adjacent edema or significant mass effect in the setting of atrophy. Chronic small vessel ischemia in the hemispheric white matter which is age congruent. No visible cortical infarct. Reportedly there was a hemorrhage precipitating the initial bleed. Vascular: Atheromatous calcification Skull: Negative Sinuses/Orbits: No visible injury.  Bilateral cataract resection. Other: Critical Value/emergent results were called by telephone at the time of interpretation on 07/13/2021 at 10:58 am to provider [facility] , who verbally acknowledged these results. IMPRESSION: Larger and now 7 cc left frontal parasagittal hemorrhage which appears both parenchymal and subarachnoid. No edema or significant mass effect. Electronically Signed   By: Jorje Guild  M.D.   On: 07/13/2021 10:59   CT HEAD WO CONTRAST  Result Date: 07/11/2021 CLINICAL DATA:  Recent fall  with facial swelling, initial encounter EXAM: CT HEAD WITHOUT CONTRAST TECHNIQUE: Contiguous axial images were obtained from the base of the skull through the vertex without intravenous contrast. COMPARISON:  02/03/2021 FINDINGS: Brain: Chronic atrophic and ischemic changes are again identified. There is minimal subarachnoid hemorrhage along the medial aspect of the left frontal lobe best seen on images 16 through 18 of series 2. No other focal area of hemorrhage is identified. Vascular: No hyperdense vessel or unexpected calcification. Skull: Normal. Negative for fracture or focal lesion. Sinuses/Orbits: No acute finding. Other: Mild soft tissue swelling is noted over the left supraorbital region consistent with the recent injury. IMPRESSION: Mild subarachnoid hemorrhage in the medial aspect of the left frontal lobe. Chronic atrophic and ischemic changes. Left supraorbital soft tissue swelling consistent with the recent injury. Critical Value/emergent results were called by telephone at the time of interpretation on 07/11/2021 at 7:18 pm to Dr. Varney Biles , who verbally acknowledged these results. Electronically Signed   By: Inez Catalina M.D.   On: 07/11/2021 19:20    CT Hip Right Wo Contrast  Result Date: 07/21/2021 CLINICAL DATA:  [place], right hip pain EXAM: CT OF THE RIGHT HIP WITHOUT CONTRAST TECHNIQUE: Multidetector CT imaging of the right hip was performed according to the standard protocol. Multiplanar CT image reconstructions were also generated. COMPARISON:  07/21/2021 FINDINGS: Bones/Joint/Cartilage There is an impacted subcapital right femoral neck fracture. Femoral head remains aligned anatomic Leigh with the acetabulum. No other acute displaced fractures. Postsurgical changes from lower lumbar posterior laminectomy and fusion. Ligaments Suboptimally assessed by CT. Muscles and Tendons Unremarkable. Soft tissues Mild subcutaneous fat stranding in the right gluteal region may reflect  posttraumatic change. No fluid collection or hematoma. Visualized intrapelvic structures are grossly unremarkable. Reconstructed images demonstrate no additional findings. IMPRESSION: 1. Impacted subcapital right femoral neck fracture. Electronically Signed   By: Randa Ngo M.D.   On: 07/21/2021 19:09   DG Hip Unilat With Pelvis 2-3 Views Left  Result Date: 07/11/2021 CLINICAL DATA:  Recent fall with left hip pain, initial encounter EXAM: DG HIP (WITH OR WITHOUT PELVIS) 2-3V LEFT COMPARISON:  02/03/2021 FINDINGS: Pelvic ring is intact. Postsurgical changes are noted in the lumbosacral spine as well as in the proximal left femur. There is been further progressive collapse of the left femoral head when compared with the prior exam. This is of uncertain chronicity but new from the prior exam of 02/03/2021. The surgically placed screws appear somewhat lucent when compared with the prior exam this is likely related to the head collapse. Mild degenerative changes of the hip joint are seen. IMPRESSION: Postsurgical changes in the proximal left femur. Progressive collapse of the left femoral head is noted with some mild displacement of previously placed surgical screws. The collapse of the femoral head is of uncertain chronicity Electronically Signed   By: Inez Catalina M.D.   On: 07/11/2021 19:33   DG Hip Unilat W or Wo Pelvis 2-3 Views Right  Result Date: 07/21/2021 CLINICAL DATA:  Status post fall with right hip pain. EXAM: DG HIP (WITH OR WITHOUT PELVIS) 2-3V RIGHT COMPARISON:  None. FINDINGS: Foreshortening of the right femoral neck is seen at the junction of the right femoral head. There is no evidence of dislocation. Moderate severity degenerative changes are seen in the form of joint space narrowing and acetabular sclerosis. Three radiopaque surgical screws are  seen within the proximal left femur with bilateral pedicle screws noted along the lower lumbar spine. IMPRESSION: Findings suspicious for a  nondisplaced fracture of the right femoral head and neck. CT correlation is recommended. Electronically Signed   By: Virgina Norfolk M.D.   On: 07/21/2021 17:36      Landis Martins 07/21/21 2136    Milton Ferguson, MD 07/29/21 1718

## 2021-07-21 NOTE — ED Notes (Signed)
NOt able to sign MSE.

## 2021-07-21 NOTE — ED Provider Notes (Signed)
[name] Provider Note   CSN: [id number] Arrival date & time: 07/21/21  1449     History Chief Complaint  Patient presents with   Fall    Pt is [place] and pt fell, found in floor with hematoma to back of head.  See and treated at [facility] last month for fall with brain bleed.  EMS reports that pt will not straight right leg, no rotation noted.  Pt is confused and her normal baseline.  VSS.        [name] is a 87 y.o. female.  The history is provided by the EMS personnel and the nursing home.  Fall This is a new problem. The current episode started less than 1 hour ago. The problem has not changed since onset.Pertinent negatives include no headaches. Nothing aggravates the symptoms. She has tried nothing for the symptoms.  Pt found in the floor at nursing home with an injury to the back of her head.  Pt also has pain in right hip with movement.   Pt recently had a SAH from a fall.  Pt discharged to nursing home on 10/2.     Past Medical History:  Diagnosis Date   Alzheimer disease ([place])    Anemia    Anxiety    Asthma    WELL CONTROLLED   Chronic back pain    Cognitive communication deficit    COPD (chronic obstructive pulmonary disease) (HCC)    Depression    Depression    GERD (gastroesophageal reflux disease)    Hiatal hernia    Hypercholesteremia    Hypothyroidism    Iron deficiency    ITP (idiopathic thrombocytopenic purpura)    FOLLOWED BR DR Grayland Ormond   LBBB (left bundle branch block)    Osteoarthritis    Osteoporosis    Restless legs     Patient Active Problem List   Diagnosis Date Noted   Cerebral hemorrhage ([place]) 07/14/2021   SAH (subarachnoid hemorrhage) ([place]) 07/13/2021   Hiatal hernia 04/09/2021   GERD (gastroesophageal reflux disease) 04/09/2021   Normocytic anemia    Skin tear of right hand without complication    Fall    Malnutrition of moderate degree 01/09/2021   Palliative care encounter    Closed  displaced fracture of left femoral neck ([place]) 10/30/2020   Rectal bleeding 08/18/2020   Leukocytosis 08/18/2020   Status post reverse total shoulder replacement, left 06/25/2020   Rotator cuff arthropathy, left 06/24/2020   Failure to thrive in adult 02/16/2020   Thrombocytopenia ([place]) 02/15/2020   Pneumonia 07/10/2019   Laceration of left lower leg 04/10/2019   Chronic pain disorder 01/11/2019   OSA (obstructive sleep apnea) 09/28/2018   Ulcer of lower limb, right, limited to breakdown of skin ([place]) 07/22/2018   Hematoma of lower limb, right, initial encounter 07/18/2018   Traumatic hematoma of right lower leg 07/12/2018   Asthma without status asthmaticus 11/25/2016   Late onset Alzheimer's disease without behavioral disturbance ([place]) 12/30/2015   Back muscle spasm 09/09/2015   Clinical depression 04/12/2015   Cellulitis 04/12/2015   Acute ITP ([place]) 03/15/2015   Chest pain 10/22/2014   Breathlessness on exertion 10/22/2014   Osteopenia 10/18/2014   Benign essential hypertension 06/28/2014   HLD (hyperlipidemia) 06/28/2014   Idiopathic thrombocythemia ([place]) 06/28/2014   Acquired hypothyroidism 06/04/2014   Depressive disorder 06/04/2014   Bursitis, trochanteric 03/27/2014   DDD (degenerative disc disease), lumbar 02/27/2014   Neuritis or radiculitis due to rupture of lumbar intervertebral  disc 02/27/2014   Lumbar radiculitis 02/27/2014    Past Surgical History:  Procedure Laterality Date   CARPAL TUNNEL RELEASE     ESOPHAGOGASTRODUODENOSCOPY (EGD) WITH PROPOFOL N/A 09/10/2015   Procedure: ESOPHAGOGASTRODUODENOSCOPY (EGD) WITH PROPOFOL;  Surgeon: Lollie Sails, MD;  Location: [facility] ENDOSCOPY;  Service: Endoscopy;  Laterality: N/A;   HIP PINNING,CANNULATED Left 10/31/2020   Procedure: CANNULATED HIP PINNING;  Surgeon: Leim Fabry, MD;  Location: [facility] ORS;  Service: Orthopedics;  Laterality: Left;   IRRIGATION AND DEBRIDEMENT HEMATOMA Right 07/13/2018   Procedure: IRRIGATION  AND DEBRIDEMENT HEMATOMA-RIGHT SHIN;  Surgeon: Herbert Pun, MD;  Location: [facility] ORS;  Service: General;  Laterality: Right;   KYPHOPLASTY N/A 05/02/2019   Procedure: L1 KYPHOPLASTY;  Surgeon: Hessie Knows, MD;  Location: [facility] ORS;  Service: Orthopedics;  Laterality: N/A;   LUMBAR DISC SURGERY     REVERSE SHOULDER ARTHROPLASTY Left 06/25/2020   Procedure: REVERSE SHOULDER ARTHROPLASTY;  Surgeon: Corky Mull, MD;  Location: [facility] ORS;  Service: Orthopedics;  Laterality: Left;   SPLENECTOMY, PARTIAL     TOTAL ABDOMINAL HYSTERECTOMY       OB History   No obstetric history on file.     Family History  Problem Relation Age of Onset   Stroke Mother     Social History   Tobacco Use   Smoking status: Never   Smokeless tobacco: Never  Vaping Use   Vaping Use: Never used  Substance Use Topics   Alcohol use: No    Alcohol/week: 0.0 standard drinks   Drug use: No    Home Medications Prior to Admission medications   Medication Sig Start Date End Date Taking? Authorizing Provider  acetaminophen (TYLENOL) 325 MG tablet Take 2 tablets (650 mg total) by mouth every 6 (six) hours as needed for mild pain (or Fever >/= 101). Patient not taking: Reported on 07/14/2021 07/02/15   Idelle Crouch, MD  ALPRAZolam Duanne Moron) 0.5 MG tablet Take 1 tablet (0.5 mg total) by mouth 3 (three) times daily as needed for anxiety. 07/15/21   Oswald Hillock, MD  amLODipine (NORVASC) 10 MG tablet Take 1 tablet (10 mg total) by mouth daily. 01/17/21   Ezekiel Slocumb, DO  busPIRone (BUSPAR) 15 MG tablet Take 7.5 mg by mouth 2 (two) times daily. 07/02/21   [provider]  Cholecalciferol (VITAMIN D) 50 MCG (2000 UT) tablet Take 2,000 Units by mouth daily.    [provider]  citalopram (CELEXA) 20 MG tablet Take 1 tablet (20 mg total) by mouth every morning. 02/06/21   Johnson, Clanford L, MD  Cyanocobalamin 1000 MCG TBCR Take 1,000 mcg by mouth daily.     [provider]   diclofenac Sodium (VOLTAREN) 1 % GEL Apply 2 g topically in the morning and at bedtime. Both knees    [provider]  Fluticasone-Salmeterol (ADVAIR) 250-50 MCG/DOSE AEPB Inhale 1 puff into the lungs 2 (two) times daily as needed (shortness of breath).  Patient not taking: Reported on 07/14/2021    [provider]  gabapentin (NEURONTIN) 100 MG capsule Take 2 capsules (200 mg total) by mouth 4 (four) times daily. 02/06/21   Johnson, Clanford L, MD  iron polysaccharides (NIFEREX) 150 MG capsule Take 1 capsule (150 mg total) by mouth daily. 01/17/21   Ezekiel Slocumb, DO  LamoTRIgine 50 MG TB24 24 hour tablet Take 50 mg by mouth at bedtime.    [provider]  levothyroxine (SYNTHROID, LEVOTHROID) 50 MCG tablet Take 50 mcg  by mouth daily before breakfast. Take 30 to 60 minutes before breakfast.    [provider]  loratadine (CLARITIN) 10 MG tablet Take 1 tablet (10 mg total) by mouth daily. 01/17/21   Ezekiel Slocumb, DO  memantine (NAMENDA) 10 MG tablet Take 10 mg by mouth 2 (two) times daily.    [provider]  Multiple Vitamin (MULTI-VITAMIN) tablet Take 1 tablet by mouth daily.    [provider]  oxybutynin (DITROPAN-XL) 5 MG 24 hr tablet Take 5 mg by mouth at bedtime.    [provider]  pantoprazole (PROTONIX) 40 MG tablet Take 40 mg by mouth daily. 07/24/15   [provider]  rosuvastatin (CRESTOR) 5 MG tablet Take 5 mg by mouth every evening.    [provider]  traMADol (ULTRAM) 50 MG tablet Take 1 tablet (50 mg total) by mouth every 6 (six) hours as needed for moderate pain. 07/15/21   Oswald Hillock, MD    Allergies    Aspirin, Diazepam, Tetanus toxoids, and Morphine  Review of Systems   Review of Systems  Neurological:  Negative for headaches.  All other systems reviewed and are negative.  Physical Exam Updated Vital Signs BP 136/71   Pulse 75   Temp 98.7 F (37.1 C) (Oral)   Resp 18   Ht 5\' 7"   (1.702 m)   Wt 60 kg   SpO2 100%   BMI 20.72 kg/m   Physical Exam Vitals and nursing note reviewed.  Constitutional:      Appearance: She is well-developed.  HENT:     Head: Normocephalic.     Ears:     Comments: Tender swollen area occipital scalp    Mouth/Throat:     Mouth: Mucous membranes are moist.  Cardiovascular:     Rate and Rhythm: Normal rate.  Pulmonary:     Effort: Pulmonary effort is normal.  Abdominal:     General: Abdomen is flat. There is no distension.  Musculoskeletal:        General: Tenderness present.     Cervical back: Normal range of motion.     Comments: Tender right hip, pain with movement  no deformity, no shortening  Skin:    General: Skin is warm.  Neurological:     Mental Status: She is alert and oriented to person, place, and time.  Psychiatric:        Mood and Affect: Mood normal.    ED Results / Procedures / Treatments   Labs (all labs ordered are listed, but only abnormal results are displayed) Labs Reviewed  CBC WITH DIFFERENTIAL/PLATELET  COMPREHENSIVE METABOLIC PANEL    EKG None  Radiology CT HEAD WO CONTRAST (5MM)  Result Date: 07/21/2021 CLINICAL DATA:  Fall, confusion EXAM: CT HEAD WITHOUT CONTRAST TECHNIQUE: Contiguous axial images were obtained from the base of the skull through the vertex without intravenous contrast. COMPARISON:  07/13/2021 FINDINGS: Brain: Again seen is the left anterior frontal intraparenchymal hemorrhage measuring 2.0 x 1.6 cm compared to 2.1 x 2.0 cm previously. Probable small amount of subarachnoid blood within the left sylvian fissure posteriorly, not definitively seen on prior study. No acute infarction. No mass effect or midline shift. No hydrocephalus. Extensive chronic small vessel disease throughout the deep white matter. Diffuse cerebral atrophy. Vascular: No hyperdense vessel or unexpected calcification. Skull: No acute calvarial abnormality. Sinuses/Orbits: No acute findings Other: Soft tissue  swelling in the right scalp. IMPRESSION: Slight decreased size of the anterior left frontal intraparenchymal hematoma  since prior study. Focus of high density in the left sylvian fissure could reflect small acute subarachnoid hemorrhage, not definitively seen on prior study. These results were called by telephone at the time of interpretation on 07/21/2021 at 6:10 pm to provider Dr. Roderic Palau, Who verbally acknowledged these results. Electronically Signed   By: Rolm Baptise M.D.   On: 07/21/2021 18:13   DG Hip Unilat W or Wo Pelvis 2-3 Views Right  Result Date: 07/21/2021 CLINICAL DATA:  Status post fall with right hip pain. EXAM: DG HIP (WITH OR WITHOUT PELVIS) 2-3V RIGHT COMPARISON:  None. FINDINGS: Foreshortening of the right femoral neck is seen at the junction of the right femoral head. There is no evidence of dislocation. Moderate severity degenerative changes are seen in the form of joint space narrowing and acetabular sclerosis. Three radiopaque surgical screws are seen within the proximal left femur with bilateral pedicle screws noted along the lower lumbar spine. IMPRESSION: Findings suspicious for a nondisplaced fracture of the right femoral head and neck. CT correlation is recommended. Electronically Signed   By: Virgina Norfolk M.D.   On: 07/21/2021 17:36    Procedures Procedures   Medications Ordered in ED Medications - No data to display  ED Course  I have reviewed the triage vital signs and the nursing notes.  Pertinent labs & imaging results that were available during my care of the patient were reviewed by me and considered in my medical decision making (see chart for details).    MDM Rules/Calculators/A&P                           MDM: Radiologist advised ct of hip.   Final Clinical Impression(s) / ED Diagnoses Final diagnoses:  None    Rx / DC Orders ED Discharge Orders     None        Sidney Ace 07/21/21 Aline August, MD 07/29/21  1717
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
## 2021-07-21 NOTE — H&P (Signed)
History and Physical  Aidah Forquer POE:[id number] DOB: October 11, 1933 DOA: 07/21/2021  Referring physician: Evalee Jefferson, PA-C  PCP: Caprice Renshaw, MD  Patient coming from: [facility]  Chief Complaint: Fall   HPI: Ariana [name] is a 87 y.o. female with medical history significant for  ITP, asthma/COPD, Alzheimer disease with behavioral disturbances, gastroesophageal reflux disease, hypertension, hypercholesterolemia, hypothyroidism, chronic kidney disease stage IIIb and an increased risk fall who presents to the emergency department via EMS due to fall sustained within 1 to presenting to the ED.  Patient was unable to provide a history, history was obtained from ED PA and ED medical record, per report she was found on the floor at the nursing home with an injury to the back of her head and she also complained of right hip pain and movement. Patient was recently admitted on 10/2-10/4 at [facility] due to subarachnoid hemorrhage sustained due to mechanical fall, neurosurgery was consulted at that time and patient did not require any intervention.  ED Course:  In the emergency department, patient was intermittently tachypneic, but other vital signs were within normal range.  Work-up in the ED showed leukocytosis, thrombocytopenia, hypokalemia and hyperglycemia.  Influenza A, B, SARS coronavirus 2 was negative. CT of the right hip without contrast showed impacted subcapital right femoral neck fracture Right hip x-ray showed findings suspicious for a nondisplaced fracture of the right femoral head and neck CT head without contrast showed slight decreased size of the anterior left frontal intraparenchymal hematoma since prior study She was treated with IV Ativan, potassium was replenished. Dr. Delton Coombes was consulted due to thrombocytopenia and recommended IVIG 1 g/kg and Nplate 250 mcg subcu to stimulate platelet production per ED medical record Orthopedic surgeon at [facility] (Dr. Marlou Sa) was consulted and will  consult with patient with plan to do surgery possibly on Wednesday (10/13) per ED medical record Hospitalist was asked to admit patient for further evaluation and management.   Review of Systems: This cannot be obtained at this time due to patient's current condition  Past Medical History:  Diagnosis Date   Alzheimer disease ([place])    Anemia    Anxiety    Asthma    WELL CONTROLLED   Chronic back pain    Cognitive communication deficit    COPD (chronic obstructive pulmonary disease) ([place])    Depression    Depression    GERD (gastroesophageal reflux disease)    Hiatal hernia    Hypercholesteremia    Hypothyroidism    Iron deficiency    ITP (idiopathic thrombocytopenic purpura)    FOLLOWED BR DR Grayland Ormond   LBBB (left bundle branch block)    Osteoarthritis    Osteoporosis    Restless legs    Past Surgical History:  Procedure Laterality Date   CARPAL TUNNEL RELEASE     ESOPHAGOGASTRODUODENOSCOPY (EGD) WITH PROPOFOL N/A 09/10/2015   Procedure: ESOPHAGOGASTRODUODENOSCOPY (EGD) WITH PROPOFOL;  Surgeon: Lollie Sails, MD;  Location: [facility] ENDOSCOPY;  Service: Endoscopy;  Laterality: N/A;   HIP PINNING,CANNULATED Left 10/31/2020   Procedure: CANNULATED HIP PINNING;  Surgeon: Leim Fabry, MD;  Location: [facility] ORS;  Service: Orthopedics;  Laterality: Left;   IRRIGATION AND DEBRIDEMENT HEMATOMA Right 07/13/2018   Procedure: IRRIGATION AND DEBRIDEMENT HEMATOMA-RIGHT SHIN;  Surgeon: Herbert Pun, MD;  Location: [facility] ORS;  Service: General;  Laterality: Right;   KYPHOPLASTY N/A 05/02/2019   Procedure: L1 KYPHOPLASTY;  Surgeon: Hessie Knows, MD;  Location: [facility] ORS;  Service: Orthopedics;  Laterality: N/A;   LUMBAR DISC SURGERY  REVERSE SHOULDER ARTHROPLASTY Left 06/25/2020   Procedure: REVERSE SHOULDER ARTHROPLASTY;  Surgeon: Corky Mull, MD;  Location: [facility] ORS;  Service: Orthopedics;  Laterality: Left;   SPLENECTOMY, PARTIAL     TOTAL ABDOMINAL HYSTERECTOMY      Social  History:  reports that she has never smoked. She has never used smokeless tobacco. She reports that she does not drink alcohol and does not use drugs.   Allergies  Allergen Reactions   Aspirin Other (See Comments)    Upset stomach   Diazepam Itching    sneezing   Tetanus Toxoids Swelling    Localized superficial swelling of skin   Morphine Itching and Rash    Family History  Problem Relation Age of Onset   Stroke Mother      Prior to Admission medications   Medication Sig Start Date End Date Taking? Authorizing Provider  acetaminophen (TYLENOL) 325 MG tablet Take 2 tablets (650 mg total) by mouth every 6 (six) hours as needed for mild pain (or Fever >/= 101). Patient not taking: Reported on 07/14/2021 07/02/15   Idelle Crouch, MD  ALPRAZolam Duanne Moron) 0.5 MG tablet Take 1 tablet (0.5 mg total) by mouth 3 (three) times daily as needed for anxiety. 07/15/21   Oswald Hillock, MD  amLODipine (NORVASC) 10 MG tablet Take 1 tablet (10 mg total) by mouth daily. 01/17/21   Ezekiel Slocumb, DO  busPIRone (BUSPAR) 15 MG tablet Take 7.5 mg by mouth 2 (two) times daily. 07/02/21   [provider]  Cholecalciferol (VITAMIN D) 50 MCG (2000 UT) tablet Take 2,000 Units by mouth daily.    [provider]  citalopram (CELEXA) 20 MG tablet Take 1 tablet (20 mg total) by mouth every morning. 02/06/21   Johnson, Clanford L, MD  Cyanocobalamin 1000 MCG TBCR Take 1,000 mcg by mouth daily.     [provider]  diclofenac Sodium (VOLTAREN) 1 % GEL Apply 2 g topically in the morning and at bedtime. Both knees    [provider]  Fluticasone-Salmeterol (ADVAIR) 250-50 MCG/DOSE AEPB Inhale 1 puff into the lungs 2 (two) times daily as needed (shortness of breath).  Patient not taking: Reported on 07/14/2021    [provider]  gabapentin (NEURONTIN) 100 MG capsule Take 2 capsules (200 mg total) by mouth 4 (four) times daily. 02/06/21   Johnson, Clanford L, MD  iron  polysaccharides (NIFEREX) 150 MG capsule Take 1 capsule (150 mg total) by mouth daily. 01/17/21   Ezekiel Slocumb, DO  LamoTRIgine 50 MG TB24 24 hour tablet Take 50 mg by mouth at bedtime.    [provider]  levothyroxine (SYNTHROID, LEVOTHROID) 50 MCG tablet Take 50 mcg by mouth daily before breakfast. Take 30 to 60 minutes before breakfast.    [provider]  loratadine (CLARITIN) 10 MG tablet Take 1 tablet (10 mg total) by mouth daily. 01/17/21   Ezekiel Slocumb, DO  memantine (NAMENDA) 10 MG tablet Take 10 mg by mouth 2 (two) times daily.    [provider]  Multiple Vitamin (MULTI-VITAMIN) tablet Take 1 tablet by mouth daily.    [provider]  oxybutynin (DITROPAN-XL) 5 MG 24 hr tablet Take 5 mg by mouth at bedtime.    [provider]  pantoprazole (PROTONIX) 40 MG tablet Take 40 mg by mouth daily. 07/24/15   [provider]  rosuvastatin (CRESTOR) 5 MG tablet Take 5 mg by mouth every evening.    [provider]  traMADol (ULTRAM) 50 MG tablet Take 1 tablet (50 mg total) by mouth every 6 (six) hours as needed for moderate pain. 07/15/21   Oswald Hillock, MD    Physical Exam: BP (!) 146/76   Pulse 79   Temp 98.7 F (37.1 C) (Oral)   Resp 17   Ht 5\' 7"  (1.702 m)   Wt 60 kg   SpO2 93%   BMI 20.72 kg/m   General: 87 y.o. year-old female well developed well nourished in no acute distress.  Somnolent, though easily arousable (patient already received Ativan in the ED)  HEENT: Occipital scalp with a tender swelling area Neck: Supple, trachea medial Cardiovascular: Regular rate and rhythm with no rubs or gallops.  No thyromegaly or JVD noted.  No lower extremity edema. 2/4 pulses in all 4 extremities. Respiratory: Clear to auscultation with no wheezes or rales. Good inspiratory effort. Abdomen: Soft, nontender nondistended with normal bowel sounds x4 quadrants. Muskuloskeletal: Opens eyes due to right hip palpitation due to  tenderness.  No cyanosis or clubbing noted bilaterally Neuro: CN II-XII intact, strength 5/5 x 4, sensation, reflexes intact Skin: No ulcerative lesions noted or rashes Psychiatry: Mood is appropriate for condition and setting          Labs on Admission:  Basic Metabolic Panel: Recent Labs  Lab 07/15/21 0310 07/21/21 1851  NA 140 139  K 3.3* 3.0*  CL 112* 103  CO2 22 28  GLUCOSE 81 136*  BUN 23 16  CREATININE 0.71 0.88  CALCIUM 8.3* 8.5*   Liver Function Tests: Recent Labs  Lab 07/15/21 0310 07/21/21 1851  AST 29 25  ALT 26 24  ALKPHOS 92 106  BILITOT 0.7 0.7  PROT 5.4* 7.0  ALBUMIN 3.1* 3.7   No results for input(s): LIPASE, AMYLASE in the last 168 hours. No results for input(s): AMMONIA in the last 168 hours. CBC: Recent Labs  Lab 07/15/21 0310 07/21/21 1851  WBC 17.4* 18.8*  NEUTROABS  --  15.0*  HGB 11.0* 13.0  HCT 35.1* 38.7  MCV 94.6 92.8  PLT 102* 12*   Cardiac Enzymes: No results for input(s): CKTOTAL, CKMB, CKMBINDEX, TROPONINI in the last 168 hours.  BNP (last 3 results) No results for input(s): BNP in the last 8760 hours.  ProBNP (last 3 results) No results for input(s): PROBNP in the last 8760 hours.  CBG: No results for input(s): GLUCAP in the last 168 hours.  Radiological Exams on Admission: CT HEAD WO CONTRAST (5MM)  Result Date: 07/21/2021 CLINICAL DATA:  Fall, confusion EXAM: CT HEAD WITHOUT CONTRAST TECHNIQUE: Contiguous axial images were obtained from the base of the skull through the vertex without intravenous contrast. COMPARISON:  07/13/2021 FINDINGS: Brain: Again seen is the left anterior frontal intraparenchymal hemorrhage measuring 2.0 x 1.6 cm compared to 2.1 x 2.0 cm previously. Probable small amount of subarachnoid blood within the left sylvian fissure posteriorly, not definitively seen on prior study. No acute infarction. No mass effect or midline shift. No hydrocephalus. Extensive chronic small vessel disease throughout the  deep white matter. Diffuse cerebral atrophy. Vascular: No hyperdense vessel or unexpected calcification. Skull: No acute calvarial abnormality. Sinuses/Orbits: No acute findings Other: Soft tissue swelling in the right scalp. IMPRESSION: Slight decreased size of the anterior left frontal intraparenchymal hematoma since prior study. Focus of high density in the left sylvian fissure could reflect small acute subarachnoid hemorrhage, not definitively seen on prior study. These results were called by telephone at the time of interpretation on  07/21/2021 at 6:10 pm to provider Dr. Roderic Palau, Who verbally acknowledged these results. Electronically Signed   By: Rolm Baptise M.D.   On: 07/21/2021 18:13   CT Hip Right Wo Contrast  Result Date: 07/21/2021 CLINICAL DATA:  [place], right hip pain EXAM: CT OF THE RIGHT HIP WITHOUT CONTRAST TECHNIQUE: Multidetector CT imaging of the right hip was performed according to the standard protocol. Multiplanar CT image reconstructions were also generated. COMPARISON:  07/21/2021 FINDINGS: Bones/Joint/Cartilage There is an impacted subcapital right femoral neck fracture. Femoral head remains aligned anatomic Leigh with the acetabulum. No other acute displaced fractures. Postsurgical changes from lower lumbar posterior laminectomy and fusion. Ligaments Suboptimally assessed by CT. Muscles and Tendons Unremarkable. Soft tissues Mild subcutaneous fat stranding in the right gluteal region may reflect posttraumatic change. No fluid collection or hematoma. Visualized intrapelvic structures are grossly unremarkable. Reconstructed images demonstrate no additional findings. IMPRESSION: 1. Impacted subcapital right femoral neck fracture. Electronically Signed   By: Randa Ngo M.D.   On: 07/21/2021 19:09   DG Hip Unilat W or Wo Pelvis 2-3 Views Right  Result Date: 07/21/2021 CLINICAL DATA:  Status post fall with right hip pain. EXAM: DG HIP (WITH OR WITHOUT PELVIS) 2-3V RIGHT COMPARISON:   None. FINDINGS: Foreshortening of the right femoral neck is seen at the junction of the right femoral head. There is no evidence of dislocation. Moderate severity degenerative changes are seen in the form of joint space narrowing and acetabular sclerosis. Three radiopaque surgical screws are seen within the proximal left femur with bilateral pedicle screws noted along the lower lumbar spine. IMPRESSION: Findings suspicious for a nondisplaced fracture of the right femoral head and neck. CT correlation is recommended. Electronically Signed   By: Virgina Norfolk M.D.   On: 07/21/2021 17:36    EKG: I independently viewed the EKG done and my findings are as followed: Normal sinus rhythm at a rate of 5 bpm, LBBB and QTc (511 ms)  Assessment/Plan Present on Admission:  Leukocytosis  Thrombocytopenia (HCC)  Acute ITP (HCC)  GERD (gastroesophageal reflux disease)  Acquired hypothyroidism  Principal Problem:   Fracture of femoral neck, right (HCC) Active Problems:   Acute ITP (HCC)   Acquired hypothyroidism   Essential hypertension   Thrombocytopenia (HCC)   Leukocytosis   GERD (gastroesophageal reflux disease)   Subarachnoid hemorrhage (HCC)   Hypokalemia   Hyperglycemia   Prolonged QT interval  Impacted subcapital right femoral neck fracture Confirmed by CT and right hip x-ray Continue Tylenol as needed for mild pain IV Fentanyl for moderate/severe pain Continue fall precaution and neurochecks Continue PT/OT eval and treat Orthopedic surgeon consulted by ED PA and will consult with patient on arrival to [facility]  Thrombocytopenia History of ITP Platelets at 12 Dr. Delton Coombes (heme/onc) was consulted and recommended IVIG and Nplate Hematologist will be consulted and we shall await further recommendations  Leukocytosis possibly reactive WBC 18.8, no obvious concern for an acute infectious process at this time Continue to monitor WBC with morning labs  History of subarachnoid  hemorrhage CT head without contrast showed slight decreased size of the anterior left frontal intraparenchymal hematoma since prior study Neurosurgery at [facility] was consulted and imaging was deemed to be of no clinical significance and will not need neurosurgical consult at this time per ED medical record.  Hypokalemia K+ is 3.0 K+ will be replenished Please monitor for AM K+ for further replenishmemnt  Hyperglycemia CBG 136, continue to monitor blood glucose with morning labs  Prolonged  QTc (511 ms) Avoid QT prolonging drugs Magnesium level will be checked  Hypothyroidism Continue Synthroid  Essential hypertension Continue amlodipine  GERD Continue Protonix  Alzheimer's dementia Continue Namenda  Anxiety Continue Xanax per home regimen   DVT prophylaxis:  SCDs  Code Status: DNR  Family Communication: None at bedside  Disposition Plan:  Patient is from:                        home Anticipated DC to:                   SNF or family members home Anticipated DC date:               2-3 days Anticipated DC barriers:          Patient requires inpatient management due to sustained fracture and thrombocytopenia pending hematology and orthopedic surgery consult.   Consults called: Orthopedic surgery, hematology  Admission status: Inpatient   Bernadette Hoit MD [facility]  07/22/2021, 1:37 AM

## 2021-07-21 NOTE — ED Notes (Signed)
Pt continues to pull at lines. Unable to redirect.

## 2021-07-22 ENCOUNTER — Encounter (HOSPITAL_COMMUNITY): Payer: Self-pay | Admitting: Internal Medicine

## 2021-07-22 DIAGNOSIS — R739 Hyperglycemia, unspecified: Secondary | ICD-10-CM

## 2021-07-22 DIAGNOSIS — R9431 Abnormal electrocardiogram [ECG] [EKG]: Secondary | ICD-10-CM

## 2021-07-22 DIAGNOSIS — D693 Immune thrombocytopenic purpura: Secondary | ICD-10-CM | POA: Diagnosis not present

## 2021-07-22 DIAGNOSIS — S72001A Fracture of unspecified part of neck of right femur, initial encounter for closed fracture: Secondary | ICD-10-CM | POA: Diagnosis not present

## 2021-07-22 DIAGNOSIS — E876 Hypokalemia: Secondary | ICD-10-CM

## 2021-07-22 DIAGNOSIS — I10 Essential (primary) hypertension: Secondary | ICD-10-CM | POA: Diagnosis not present

## 2021-07-22 DIAGNOSIS — E039 Hypothyroidism, unspecified: Secondary | ICD-10-CM | POA: Diagnosis not present

## 2021-07-22 LAB — CBC
HCT: 35.3 % — ABNORMAL LOW (ref 36.0–46.0)
Hemoglobin: 11.6 g/dL — ABNORMAL LOW (ref 12.0–15.0)
MCH: 30.8 pg (ref 26.0–34.0)
MCHC: 32.9 g/dL (ref 30.0–36.0)
MCV: 93.6 fL (ref 80.0–100.0)
Platelets: 27 10*3/uL — CL (ref 150–400)
RBC: 3.77 MIL/uL — ABNORMAL LOW (ref 3.87–5.11)
RDW: 16.6 % — ABNORMAL HIGH (ref 11.5–15.5)
WBC: 14 10*3/uL — ABNORMAL HIGH (ref 4.0–10.5)
nRBC: 0 % (ref 0.0–0.2)

## 2021-07-22 LAB — COMPREHENSIVE METABOLIC PANEL
ALT: 19 U/L (ref 0–44)
AST: 20 U/L (ref 15–41)
Albumin: 3.3 g/dL — ABNORMAL LOW (ref 3.5–5.0)
Alkaline Phosphatase: 89 U/L (ref 38–126)
Anion gap: 5 (ref 5–15)
BUN: 14 mg/dL (ref 8–23)
CO2: 27 mmol/L (ref 22–32)
Calcium: 8.5 mg/dL — ABNORMAL LOW (ref 8.9–10.3)
Chloride: 104 mmol/L (ref 98–111)
Creatinine, Ser: 0.67 mg/dL (ref 0.44–1.00)
GFR, Estimated: >60 mL/min (ref >60)
Glucose, Bld: 110 mg/dL — ABNORMAL HIGH (ref 70–99)
Potassium: 3.8 mmol/L (ref 3.5–5.1)
Sodium: 136 mmol/L (ref 135–145)
Total Bilirubin: 0.8 mg/dL (ref 0.3–1.2)
Total Protein: 8.1 g/dL (ref 6.5–8.1)

## 2021-07-22 LAB — PROTIME-INR
INR: 1 (ref 0.8–1.2)
Prothrombin Time: 13.5 seconds (ref 11.4–15.2)

## 2021-07-22 LAB — PHOSPHORUS: Phosphorus: 2.7 mg/dL (ref 2.5–4.6)

## 2021-07-22 LAB — APTT: aPTT: 27 seconds (ref 24–36)

## 2021-07-22 LAB — MAGNESIUM: Magnesium: 1.8 mg/dL (ref 1.7–2.4)

## 2021-07-22 MED ORDER — PANTOPRAZOLE SODIUM 40 MG IV SOLR: 40.0000 mg | Freq: Two times a day (BID) | INTRAVENOUS | Status: DC

## 2021-07-22 MED ORDER — SODIUM CHLORIDE 0.9 % IV SOLN
INTRAVENOUS | Status: DC | PRN
  Administered 2021-07-22: 1000 mL via INTRAVENOUS

## 2021-07-22 MED ORDER — LEVOTHYROXINE SODIUM 50 MCG PO TABS
50.0000 ug | ORAL_TABLET | Freq: Every day | ORAL | Status: DC
  Administered 2021-07-25 – 2021-07-30 (×6): 50 ug via ORAL
  Filled 2021-07-22 (×8): qty 1

## 2021-07-22 MED ORDER — MEMANTINE HCL 5 MG PO TABS
10.0000 mg | ORAL_TABLET | Freq: Two times a day (BID) | ORAL | Status: DC
  Administered 2021-07-22 – 2021-07-30 (×15): 10 mg via ORAL
  Filled 2021-07-22 (×13): qty 2
  Filled 2021-07-22: qty 1
  Filled 2021-07-22 (×3): qty 2

## 2021-07-22 MED ORDER — FENTANYL CITRATE PF 50 MCG/ML IJ SOSY
25.0000 ug | PREFILLED_SYRINGE | INTRAMUSCULAR | Status: DC | PRN
  Administered 2021-07-22 – 2021-07-24 (×10): 25 ug via INTRAVENOUS
  Filled 2021-07-22 (×10): qty 1

## 2021-07-22 MED ORDER — PANTOPRAZOLE SODIUM 40 MG IV SOLR
40.0000 mg | INTRAVENOUS | Status: DC
  Administered 2021-07-22 – 2021-07-25 (×3): 40 mg via INTRAVENOUS
  Filled 2021-07-22 (×4): qty 40

## 2021-07-22 MED ORDER — POTASSIUM CHLORIDE CRYS ER 20 MEQ PO TBCR
40.0000 meq | EXTENDED_RELEASE_TABLET | Freq: Once | ORAL | Status: AC
  Administered 2021-07-22: 40 meq via ORAL
  Filled 2021-07-22: qty 2

## 2021-07-22 MED ORDER — PANTOPRAZOLE SODIUM 40 MG PO TBEC: 40.0000 mg | DELAYED_RELEASE_TABLET | Freq: Every day | ORAL | Status: DC

## 2021-07-22 MED ORDER — ALPRAZOLAM 0.25 MG PO TABS
0.5000 mg | ORAL_TABLET | Freq: Three times a day (TID) | ORAL | Status: DC | PRN
  Administered 2021-07-22 – 2021-07-29 (×9): 0.5 mg via ORAL
  Filled 2021-07-22 (×4): qty 2
  Filled 2021-07-22: qty 1
  Filled 2021-07-22 (×7): qty 2

## 2021-07-22 MED ORDER — LORAZEPAM 2 MG/ML IJ SOLN
0.5000 mg | Freq: Once | INTRAMUSCULAR | Status: AC
  Administered 2021-07-22: 0.5 mg via INTRAVENOUS
  Filled 2021-07-22: qty 1

## 2021-07-22 MED ORDER — ENSURE ENLIVE PO LIQD
237.0000 mL | Freq: Two times a day (BID) | ORAL | Status: DC
  Administered 2021-07-23 – 2021-07-30 (×9): 237 mL via ORAL

## 2021-07-22 MED ORDER — AMLODIPINE BESYLATE 10 MG PO TABS
10.0000 mg | ORAL_TABLET | Freq: Every day | ORAL | Status: DC
  Administered 2021-07-22 – 2021-07-30 (×7): 10 mg via ORAL
  Filled 2021-07-22 (×2): qty 1
  Filled 2021-07-22: qty 2
  Filled 2021-07-22 (×6): qty 1

## 2021-07-22 MED ORDER — ROSUVASTATIN CALCIUM 5 MG PO TABS
5.0000 mg | ORAL_TABLET | Freq: Every evening | ORAL | Status: DC
  Administered 2021-07-22 – 2021-07-29 (×5): 5 mg via ORAL
  Filled 2021-07-22 (×6): qty 1

## 2021-07-22 MED ORDER — ACETAMINOPHEN 325 MG PO TABS
650.0000 mg | ORAL_TABLET | Freq: Four times a day (QID) | ORAL | Status: DC | PRN
  Administered 2021-07-22: 650 mg via ORAL
  Filled 2021-07-22: qty 2

## 2021-07-22 MED ORDER — MAGNESIUM SULFATE 2 GM/50ML IV SOLN
2.0000 g | Freq: Once | INTRAVENOUS | Status: AC
  Administered 2021-07-22: 2 g via INTRAVENOUS
  Filled 2021-07-22: qty 50

## 2021-07-22 MED ORDER — STERILE WATER FOR INJECTION IJ SOLN
INTRAMUSCULAR | Status: AC
  Filled 2021-07-22: qty 10

## 2021-07-22 NOTE — Progress Notes (Signed)
   07/22/21 1657  Vitals  Temp 99.1 F (37.3 C)  Temp Source Oral  BP (!) 166/75  MAP (mmHg) 96  BP Location Right Arm  BP Method Automatic  Patient Position (if appropriate) Lying  Pulse Rate 88  Pulse Rate Source Monitor  ECG Heart Rate 93  Resp 20  Level of Consciousness  Level of Consciousness Alert  MEWS COLOR  MEWS Score Color Green  Oxygen Therapy  SpO2 97 %  O2 Device Room Air  Height and Weight  Height 5\' 7"  (1.702 m)  Type of Scale Used Bed  Type of Weight Actual  Weight in (lb) to have BMI = 25 159.3  MEWS Score  MEWS Temp 0  MEWS Systolic 0  MEWS Pulse 0  MEWS RR 0  MEWS LOC 0  MEWS Score 0  Note  Observations Patient arived to unit, AMS. Oriented to unit and call system, verbalized understanding. Call light and personal items placed in reach of the patient

## 2021-07-22 NOTE — ED Notes (Signed)
[facility] at bedside 

## 2021-07-22 NOTE — ED Notes (Signed)
Upon assessment pt soiled with stool and urine. Pericare provided, total bed linens changed, and new purwick placed.

## 2021-07-22 NOTE — Progress Notes (Signed)
PROGRESS NOTE  Ariana [name] POE:[id number] DOB: 05-10-1933 DOA: 07/21/2021 PCP: Caprice Renshaw, MD  Brief History:  Hx per Dr. Josephine Cables 87 y.o. female with medical history significant for  ITP, asthma/COPD, Alzheimer disease with behavioral disturbances, gastroesophageal reflux disease, hypertension, hypercholesterolemia, hypothyroidism, chronic kidney disease stage IIIb and an increased risk fall who presents to the emergency department via EMS due to fall sustained within 1 hour  to presenting to the ED.  Patient was unable to provide a history, history was obtained from ED PA and ED medical record, per report she was found on the floor at the nursing home with an injury to the back of her head and she also complained of right hip pain and movement. Patient was recently admitted on 10/2-10/4 at [facility] due to subarachnoid hemorrhage sustained due to mechanical fall, neurosurgery was consulted at that time and patient did not require any intervention.   ED Course:  In the emergency department, patient was intermittently tachypneic, but other vital signs were within normal range.  Work-up in the ED showed leukocytosis, thrombocytopenia, hypokalemia and hyperglycemia.  Influenza A, B, SARS coronavirus 2 was negative. CT of the right hip without contrast showed impacted subcapital right femoral neck fracture Right hip x-ray showed findings suspicious for a nondisplaced fracture of the right femoral head and neck CT head without contrast showed slight decreased size of the anterior left frontal intraparenchymal hematoma since prior study She was treated with IV Ativan, potassium was replenished. Dr. Delton Coombes was consulted due to thrombocytopenia and recommended IVIG 1 g/kg and Nplate 250 mcg subcu to stimulate platelet production per ED medical record Orthopedic surgeon at [facility] (Dr. Marlou Sa) was consulted and will consult with patient with plan to do surgery possibly on Wednesday (10/13) per  ED medical record Hospitalist was asked to admit patient for further evaluation and management.   Assessment/Plan: Impacted subcapital right femoral neck fracture Confirmed by CT and right hip x-ray Continue Tylenol as needed for mild pain IV Fentanyl for moderate/severe pain Continue fall precaution and neurochecks PT/OT eval and treat after operative repair Orthopedic surgeon (Dr. Marlou Sa) consulted by ED PA and will consult with patient on arrival to [facility]   Thrombocytopenia History of ITP Platelets at 12>>27 Dr. Delton Coombes (heme/onc) was consulted and recommended IVIG and Nplate>>given 07/21/21   Leukocytosis WBC 18.8, no obvious concern for an acute infectious process at this time>>afebrile and hemodynamically stable Continue to monitor WBC with morning labs -likely stress demargination   History of subarachnoid hemorrhage CT head without contrast showed slight decreased size of the anterior left frontal intraparenchymal hematoma since prior study -Focus of high density in the left sylvian fissure could reflect small acute subarachnoid hemorrhage, not definitively seen on prior study Neurosurgery at [facility] (Dr. Christella Noa) was consulted and imaging was deemed to be of no clinical significance and will not need neurosurgical consult at this time per ED medical record.  Hypokalemia K+ is 3.0 -repleted -check mag--1.8  Hyperglycemia CBG 136, continue to monitor blood glucose with morning labs   Prolonged QTc (511 ms) Avoid QT prolonging drugs Magnesium level will be checked  Hypothyroidism Continue Synthroid   Essential hypertension Continue amlodipine  GERD Continue Protonix  Alzheimer's dementia Continue Namenda  Anxiety Continue Xanax per home regimen      Status is: Inpatient  Remains inpatient appropriate because:Inpatient level of care appropriate due to severity of illness  Dispo: The patient is from: SNF  Anticipated d/c is to: SNF               Patient currently is not medically stable to d/c.   Difficult to place patient No        Family Communication:   no Family at bedside  Consultants:  ortho  Code Status:  DNR  DVT Prophylaxis:  [place] Heparin   Procedures: As Listed in Progress Note Above  Antibiotics: None      Subjective: Patient is pleasantly confused.  Denies headache, f/c, cp, sob, n/v/d, abd pain  Objective: Vitals:   07/22/21 0700 07/22/21 0715 07/22/21 0730 07/22/21 0800  BP: (!) 146/69  (!) 151/67 (!) 148/72  Pulse: 73 77 74 72  Resp: 17 20 (!) 21 15  Temp:      TempSrc:      SpO2: 100% 100% 100% 100%  Weight:      Height:        Intake/Output Summary (Last 24 hours) at 07/22/2021 0910 Last data filed at 07/22/2021 0735 Gross per 24 hour  Intake --  Output 800 ml  Net -800 ml   Weight change:  Exam:  General:  Pt is alert, follows commands appropriately, not in acute distress HEENT: No icterus, No thrush, No neck mass, [place]/AT Cardiovascular: RRR, S1/S2, no rubs, no gallops Respiratory: CTA bilaterally, no wheezing, no crackles, no rhonchi Abdomen: Soft/+BS, non tender, non distended, no guarding Extremities: No edema, No lymphangitis, No petechiae, No rashes, no synovitis   Data Reviewed: I have personally reviewed following labs and imaging studies Basic Metabolic Panel: Recent Labs  Lab 07/21/21 1851 07/22/21 0407  NA 139 136  K 3.0* 3.8  CL 103 104  CO2 28 27  GLUCOSE 136* 110*  BUN 16 14  CREATININE 0.88 0.67  CALCIUM 8.5* 8.5*  MG  --  1.8  PHOS  --  2.7   Liver Function Tests: Recent Labs  Lab 07/21/21 1851 07/22/21 0407  AST 25 20  ALT 24 19  ALKPHOS 106 89  BILITOT 0.7 0.8  PROT 7.0 8.1  ALBUMIN 3.7 3.3*   No results for input(s): LIPASE, AMYLASE in the last 168 hours. No results for input(s): AMMONIA in the last 168 hours. Coagulation Profile: Recent Labs  Lab 07/22/21 0407  INR 1.0   CBC: Recent Labs  Lab 07/21/21 1851  07/22/21 0407  WBC 18.8* 14.0*  NEUTROABS 15.0*  --   HGB 13.0 11.6*  HCT 38.7 35.3*  MCV 92.8 93.6  PLT 12* 27*   Cardiac Enzymes: No results for input(s): CKTOTAL, CKMB, CKMBINDEX, TROPONINI in the last 168 hours. BNP: Invalid input(s): POCBNP CBG: No results for input(s): GLUCAP in the last 168 hours. HbA1C: No results for input(s): HGBA1C in the last 72 hours. Urine analysis:    Component Value Date/Time   COLORURINE AMBER (A) 07/13/2021 1011   APPEARANCEUR CLOUDY (A) 07/13/2021 1011   APPEARANCEUR Clear 09/03/2014 1200   LABSPEC 1.025 07/13/2021 1011   LABSPEC 1.018 09/03/2014 1200   PHURINE 5.0 07/13/2021 1011   GLUCOSEU NEGATIVE 07/13/2021 1011   GLUCOSEU Negative 09/03/2014 1200   HGBUR SMALL (A) 07/13/2021 1011   BILIRUBINUR NEGATIVE 07/13/2021 1011   BILIRUBINUR Negative 09/03/2014 1200   KETONESUR 5 (A) 07/13/2021 1011   PROTEINUR 30 (A) 07/13/2021 1011   NITRITE NEGATIVE 07/13/2021 1011   LEUKOCYTESUR SMALL (A) 07/13/2021 1011   LEUKOCYTESUR Negative 09/03/2014 1200   Sepsis Labs: @LABRCNTIP (procalcitonin:4,lacticidven:4) ) Recent Results (from the past 240 hour(s))  Resp  Panel by RT-PCR (Flu A&B, Covid) Nasopharyngeal Swab     Status: None   Collection Time: 07/13/21 12:26 PM   Specimen: Nasopharyngeal Swab; Nasopharyngeal(NP) swabs in vial transport medium  Result Value Ref Range Status   SARS Coronavirus 2 by RT PCR NEGATIVE NEGATIVE Final    Comment: (NOTE) SARS-CoV-2 target nucleic acids are NOT DETECTED.  The SARS-CoV-2 RNA is generally detectable in upper respiratory specimens during the acute phase of infection. The lowest concentration of SARS-CoV-2 viral copies this assay can detect is 138 copies/mL. A negative result does not preclude SARS-Cov-2 infection and should not be used as the sole basis for treatment or other patient management decisions. A negative result may occur with  improper specimen collection/handling, submission of  specimen other than nasopharyngeal swab, presence of viral mutation(s) within the areas targeted by this assay, and inadequate number of viral copies(<138 copies/mL). A negative result must be combined with clinical observations, patient history, and epidemiological information. The expected result is Negative.  Fact Sheet for Patients:  EntrepreneurPulse.com.au  Fact Sheet for Healthcare Providers:  IncredibleEmployment.be  This test is no t yet approved or cleared by the [place] FDA and  has been authorized for detection and/or diagnosis of SARS-CoV-2 by FDA under an Emergency Use Authorization (EUA). This EUA will remain  in effect (meaning this test can be used) for the duration of the COVID-19 declaration under Section 564(b)(1) of the Act, 21 U.S.C.section 360bbb-3(b)(1), unless the authorization is terminated  or revoked sooner.       Influenza A by PCR NEGATIVE NEGATIVE Final   Influenza B by PCR NEGATIVE NEGATIVE Final    Comment: (NOTE) The Xpert Xpress SARS-CoV-2/FLU/RSV plus assay is intended as an aid in the diagnosis of influenza from Nasopharyngeal swab specimens and should not be used as a sole basis for treatment. Nasal washings and aspirates are unacceptable for Xpert Xpress SARS-CoV-2/FLU/RSV testing.  Fact Sheet for Patients: EntrepreneurPulse.com.au  Fact Sheet for Healthcare Providers: IncredibleEmployment.be  This test is not yet approved or cleared by the [place] FDA and has been authorized for detection and/or diagnosis of SARS-CoV-2 by FDA under an Emergency Use Authorization (EUA). This EUA will remain in effect (meaning this test can be used) for the duration of the COVID-19 declaration under Section 564(b)(1) of the Act, 21 U.S.C. section 360bbb-3(b)(1), unless the authorization is terminated or revoked.  Performed at [facility], [address],  [place], [place] [address]   Resp Panel by RT-PCR (Flu A&B, Covid) Nasopharyngeal Swab     Status: None   Collection Time: 07/21/21  7:54 PM   Specimen: Nasopharyngeal Swab; Nasopharyngeal(NP) swabs in vial transport medium  Result Value Ref Range Status   SARS Coronavirus 2 by RT PCR NEGATIVE NEGATIVE Final    Comment: (NOTE) SARS-CoV-2 target nucleic acids are NOT DETECTED.  The SARS-CoV-2 RNA is generally detectable in upper respiratory specimens during the acute phase of infection. The lowest concentration of SARS-CoV-2 viral copies this assay can detect is 138 copies/mL. A negative result does not preclude SARS-Cov-2 infection and should not be used as the sole basis for treatment or other patient management decisions. A negative result may occur with  improper specimen collection/handling, submission of specimen other than nasopharyngeal swab, presence of viral mutation(s) within the areas targeted by this assay, and inadequate number of viral copies(<138 copies/mL). A negative result must be combined with clinical observations, patient history, and epidemiological information. The expected result is Negative.  Fact Sheet for Patients:  EntrepreneurPulse.com.au  Fact Sheet for Healthcare Providers:  IncredibleEmployment.be  This test is no t yet approved or cleared by the [place] FDA and  has been authorized for detection and/or diagnosis of SARS-CoV-2 by FDA under an Emergency Use Authorization (EUA). This EUA will remain  in effect (meaning this test can be used) for the duration of the COVID-19 declaration under Section 564(b)(1) of the Act, 21 U.S.C.section 360bbb-3(b)(1), unless the authorization is terminated  or revoked sooner.       Influenza A by PCR NEGATIVE NEGATIVE Final   Influenza B by PCR NEGATIVE NEGATIVE Final    Comment: (NOTE) The Xpert Xpress SARS-CoV-2/FLU/RSV plus assay is intended as an aid in the diagnosis of  influenza from Nasopharyngeal swab specimens and should not be used as a sole basis for treatment. Nasal washings and aspirates are unacceptable for Xpert Xpress SARS-CoV-2/FLU/RSV testing.  Fact Sheet for Patients: EntrepreneurPulse.com.au  Fact Sheet for Healthcare Providers: IncredibleEmployment.be  This test is not yet approved or cleared by the [place] FDA and has been authorized for detection and/or diagnosis of SARS-CoV-2 by FDA under an Emergency Use Authorization (EUA). This EUA will remain in effect (meaning this test can be used) for the duration of the COVID-19 declaration under Section 564(b)(1) of the Act, 21 U.S.C. section 360bbb-3(b)(1), unless the authorization is terminated or revoked.  Performed at [facility], [address] [place], [place] [address]      Scheduled Meds:  amLODipine  10 mg Oral Daily   levothyroxine  50 mcg Oral QAC breakfast   memantine  10 mg Oral BID   pantoprazole (PROTONIX) IV  40 mg Intravenous Q24H   potassium chloride SA  40 mEq Oral Once   rosuvastatin  5 mg Oral QPM   Continuous Infusions:  sodium chloride     Immune Globulin 10% Stopped (07/22/21 0321)   magnesium sulfate bolus IVPB      Procedures/Studies: DG Chest 1 View  Result Date: 07/11/2021 CLINICAL DATA:  Recent fall with left-sided pain, initial encounter EXAM: PORTABLE CHEST 1 VIEW COMPARISON:  05/04/2021 FINDINGS: Cardiac shadow is mildly prominent but stable. Aortic calcifications are noted. The lungs are well aerated bilaterally. No focal infiltrate or sizable effusion is seen. Postsurgical changes in the left shoulder are noted. No acute bony abnormality is seen. IMPRESSION: No acute abnormality noted. Electronically Signed   By: Inez Catalina M.D.   On: 07/11/2021 19:31   CT HEAD WO CONTRAST (5MM)  Result Date: 07/21/2021 CLINICAL DATA:  Fall, confusion EXAM: CT HEAD WITHOUT CONTRAST TECHNIQUE: Contiguous axial images  were obtained from the base of the skull through the vertex without intravenous contrast. COMPARISON:  07/13/2021 FINDINGS: Brain: Again seen is the left anterior frontal intraparenchymal hemorrhage measuring 2.0 x 1.6 cm compared to 2.1 x 2.0 cm previously. Probable small amount of subarachnoid blood within the left sylvian fissure posteriorly, not definitively seen on prior study. No acute infarction. No mass effect or midline shift. No hydrocephalus. Extensive chronic small vessel disease throughout the deep white matter. Diffuse cerebral atrophy. Vascular: No hyperdense vessel or unexpected calcification. Skull: No acute calvarial abnormality. Sinuses/Orbits: No acute findings Other: Soft tissue swelling in the right scalp. IMPRESSION: Slight decreased size of the anterior left frontal intraparenchymal hematoma since prior study. Focus of high density in the left sylvian fissure could reflect small acute subarachnoid hemorrhage, not definitively seen on prior study. These results were called by telephone at the time of interpretation on 07/21/2021 at 6:10 pm  to provider Dr. Roderic Palau, Who verbally acknowledged these results. Electronically Signed   By: Rolm Baptise M.D.   On: 07/21/2021 18:13   CT Head Wo Contrast  Result Date: 07/13/2021 CLINICAL DATA:  Mental status change.  Recent hemorrhage EXAM: CT HEAD WITHOUT CONTRAST TECHNIQUE: Contiguous axial images were obtained from the base of the skull through the vertex without intravenous contrast. COMPARISON:  07/11/2021 FINDINGS: Brain: Larger anterior and parasagittal left frontal hematoma along the surface of the brain which measures 20 x 21 x 3.6 cm. Location appears intraparenchymal, although on prior there was clearly a subarachnoid appearance. No adjacent edema or significant mass effect in the setting of atrophy. Chronic small vessel ischemia in the hemispheric white matter which is age congruent. No visible cortical infarct. Reportedly there was a  hemorrhage precipitating the initial bleed. Vascular: Atheromatous calcification Skull: Negative Sinuses/Orbits: No visible injury.  Bilateral cataract resection. Other: Critical Value/emergent results were called by telephone at the time of interpretation on 07/13/2021 at 10:58 am to provider [facility] , who verbally acknowledged these results. IMPRESSION: Larger and now 7 cc left frontal parasagittal hemorrhage which appears both parenchymal and subarachnoid. No edema or significant mass effect. Electronically Signed   By: Jorje Guild M.D.   On: 07/13/2021 10:59   CT HEAD WO CONTRAST  Result Date: 07/11/2021 CLINICAL DATA:  Recent fall with facial swelling, initial encounter EXAM: CT HEAD WITHOUT CONTRAST TECHNIQUE: Contiguous axial images were obtained from the base of the skull through the vertex without intravenous contrast. COMPARISON:  02/03/2021 FINDINGS: Brain: Chronic atrophic and ischemic changes are again identified. There is minimal subarachnoid hemorrhage along the medial aspect of the left frontal lobe best seen on images 16 through 18 of series 2. No other focal area of hemorrhage is identified. Vascular: No hyperdense vessel or unexpected calcification. Skull: Normal. Negative for fracture or focal lesion. Sinuses/Orbits: No acute finding. Other: Mild soft tissue swelling is noted over the left supraorbital region consistent with the recent injury. IMPRESSION: Mild subarachnoid hemorrhage in the medial aspect of the left frontal lobe. Chronic atrophic and ischemic changes. Left supraorbital soft tissue swelling consistent with the recent injury. Critical Value/emergent results were called by telephone at the time of interpretation on 07/11/2021 at 7:18 pm to Dr. Varney Biles , who verbally acknowledged these results. Electronically Signed   By: Inez Catalina M.D.   On: 07/11/2021 19:20   CT Cervical Spine Wo Contrast  Result Date: 07/11/2021 CLINICAL DATA:  Neck trauma.  Fall. EXAM:  CT CERVICAL SPINE WITHOUT CONTRAST TECHNIQUE: Multidetector CT imaging of the cervical spine was performed without intravenous contrast. Multiplanar CT image reconstructions were also generated. COMPARISON:  Cervical spine CT 02/03/2021.  Is this FINDINGS: Alignment: There is mild chronic anterolisthesis at C2-C3 and C7-T1. Alignment is otherwise anatomic. Skull base and vertebrae: Visualized skull base is intact. No acute fractures. No focal osseous lesions. Soft tissues and spinal canal: No prevertebral fluid or swelling. No visible canal hematoma. Disc levels: There is intervertebral disc space narrowing throughout the cervical spine which is severe at C4-C5, C5-C6, C6-C7 and C7-T1. Findings are similar to the prior examination. There is no severe central canal or neural foraminal stenosis at any level. Upper chest: Negative. Other: None. IMPRESSION: 1. No acute fracture or traumatic subluxation. 2. Stable multilevel degenerative changes. Electronically Signed   By: Ronney Asters M.D.   On: 07/11/2021 20:20   CT Hip Right Wo Contrast  Result Date: 07/21/2021 CLINICAL DATA:  [place], right  hip pain EXAM: CT OF THE RIGHT HIP WITHOUT CONTRAST TECHNIQUE: Multidetector CT imaging of the right hip was performed according to the standard protocol. Multiplanar CT image reconstructions were also generated. COMPARISON:  07/21/2021 FINDINGS: Bones/Joint/Cartilage There is an impacted subcapital right femoral neck fracture. Femoral head remains aligned anatomic Leigh with the acetabulum. No other acute displaced fractures. Postsurgical changes from lower lumbar posterior laminectomy and fusion. Ligaments Suboptimally assessed by CT. Muscles and Tendons Unremarkable. Soft tissues Mild subcutaneous fat stranding in the right gluteal region may reflect posttraumatic change. No fluid collection or hematoma. Visualized intrapelvic structures are grossly unremarkable. Reconstructed images demonstrate no additional findings.  IMPRESSION: 1. Impacted subcapital right femoral neck fracture. Electronically Signed   By: Randa Ngo M.D.   On: 07/21/2021 19:09   DG Hip Unilat With Pelvis 2-3 Views Left  Result Date: 07/11/2021 CLINICAL DATA:  Recent fall with left hip pain, initial encounter EXAM: DG HIP (WITH OR WITHOUT PELVIS) 2-3V LEFT COMPARISON:  02/03/2021 FINDINGS: Pelvic ring is intact. Postsurgical changes are noted in the lumbosacral spine as well as in the proximal left femur. There is been further progressive collapse of the left femoral head when compared with the prior exam. This is of uncertain chronicity but new from the prior exam of 02/03/2021. The surgically placed screws appear somewhat lucent when compared with the prior exam this is likely related to the head collapse. Mild degenerative changes of the hip joint are seen. IMPRESSION: Postsurgical changes in the proximal left femur. Progressive collapse of the left femoral head is noted with some mild displacement of previously placed surgical screws. The collapse of the femoral head is of uncertain chronicity Electronically Signed   By: Inez Catalina M.D.   On: 07/11/2021 19:33   DG Hip Unilat W or Wo Pelvis 2-3 Views Right  Result Date: 07/21/2021 CLINICAL DATA:  Status post fall with right hip pain. EXAM: DG HIP (WITH OR WITHOUT PELVIS) 2-3V RIGHT COMPARISON:  None. FINDINGS: Foreshortening of the right femoral neck is seen at the junction of the right femoral head. There is no evidence of dislocation. Moderate severity degenerative changes are seen in the form of joint space narrowing and acetabular sclerosis. Three radiopaque surgical screws are seen within the proximal left femur with bilateral pedicle screws noted along the lower lumbar spine. IMPRESSION: Findings suspicious for a nondisplaced fracture of the right femoral head and neck. CT correlation is recommended. Electronically Signed   By: Virgina Norfolk M.D.   On: 07/21/2021 17:36    Orson Eva, DO  [facility]  If 7PM-7AM, please contact night-coverage [email or web address] Password [email or web address] 07/22/2021, 9:10 AM   LOS: 1 day

## 2021-07-22 NOTE — Progress Notes (Signed)
Orthopedics aware of patient. Plan for THA tomorrow with Dr. Erlinda Hong.  NPO after midnight tonight.  Dr. Erlinda Hong to see and evaluate patient later today/early tomorrow.

## 2021-07-22 NOTE — ED Notes (Addendum)
[facility] called at this time.

## 2021-07-23 ENCOUNTER — Inpatient Hospital Stay: Payer: Medicare HMO | Admitting: Oncology

## 2021-07-23 ENCOUNTER — Inpatient Hospital Stay: Payer: Medicare HMO

## 2021-07-23 DIAGNOSIS — D693 Immune thrombocytopenic purpura: Secondary | ICD-10-CM | POA: Diagnosis not present

## 2021-07-23 DIAGNOSIS — S72001A Fracture of unspecified part of neck of right femur, initial encounter for closed fracture: Secondary | ICD-10-CM | POA: Diagnosis not present

## 2021-07-23 DIAGNOSIS — E039 Hypothyroidism, unspecified: Secondary | ICD-10-CM | POA: Diagnosis not present

## 2021-07-23 DIAGNOSIS — E871 Hypo-osmolality and hyponatremia: Secondary | ICD-10-CM

## 2021-07-23 DIAGNOSIS — E440 Moderate protein-calorie malnutrition: Secondary | ICD-10-CM

## 2021-07-23 DIAGNOSIS — W19XXXA Unspecified fall, initial encounter: Secondary | ICD-10-CM | POA: Diagnosis not present

## 2021-07-23 DIAGNOSIS — D72825 Bandemia: Secondary | ICD-10-CM

## 2021-07-23 DIAGNOSIS — Y92129 Unspecified place in nursing home as the place of occurrence of the external cause: Secondary | ICD-10-CM

## 2021-07-23 DIAGNOSIS — G309 Alzheimer's disease, unspecified: Secondary | ICD-10-CM

## 2021-07-23 DIAGNOSIS — F0280 Dementia in other diseases classified elsewhere without behavioral disturbance: Secondary | ICD-10-CM

## 2021-07-23 LAB — ABO/RH: ABO/RH(D): B POS

## 2021-07-23 LAB — BASIC METABOLIC PANEL
Anion gap: 7 (ref 5–15)
BUN: 12 mg/dL (ref 8–23)
CO2: 23 mmol/L (ref 22–32)
Calcium: 8.6 mg/dL — ABNORMAL LOW (ref 8.9–10.3)
Chloride: 102 mmol/L (ref 98–111)
Creatinine, Ser: 0.61 mg/dL (ref 0.44–1.00)
GFR, Estimated: >60 mL/min (ref >60)
Glucose, Bld: 93 mg/dL (ref 70–99)
Potassium: 4.3 mmol/L (ref 3.5–5.1)
Sodium: 132 mmol/L — ABNORMAL LOW (ref 135–145)

## 2021-07-23 LAB — PREPARE PLATELET PHERESIS: Unit division: 0

## 2021-07-23 LAB — CBC
HCT: 35.9 % — ABNORMAL LOW (ref 36.0–46.0)
Hemoglobin: 12 g/dL (ref 12.0–15.0)
MCH: 30.3 pg (ref 26.0–34.0)
MCHC: 33.4 g/dL (ref 30.0–36.0)
MCV: 90.7 fL (ref 80.0–100.0)
Platelets: UNDETERMINED 10*3/uL (ref 150–400)
RBC: 3.96 MIL/uL (ref 3.87–5.11)
RDW: 16.4 % — ABNORMAL HIGH (ref 11.5–15.5)
WBC: 15.6 10*3/uL — ABNORMAL HIGH (ref 4.0–10.5)
nRBC: 0 % (ref 0.0–0.2)

## 2021-07-23 LAB — BPAM PLATELET PHERESIS
Blood Product Expiration Date: 202210132359
ISSUE DATE / TIME: 202210121124
Unit Type and Rh: 6200

## 2021-07-23 LAB — PLATELET COUNT: Platelets: 67 10*3/uL — ABNORMAL LOW (ref 150–400)

## 2021-07-23 MED ORDER — ADULT MULTIVITAMIN W/MINERALS CH
1.0000 | ORAL_TABLET | Freq: Every day | ORAL | Status: DC
  Administered 2021-07-25 – 2021-07-30 (×6): 1 via ORAL
  Filled 2021-07-23 (×7): qty 1

## 2021-07-23 MED ORDER — ACETAMINOPHEN 325 MG PO TABS
650.0000 mg | ORAL_TABLET | Freq: Four times a day (QID) | ORAL | Status: DC
  Administered 2021-07-23 (×2): 650 mg via ORAL
  Filled 2021-07-23 (×3): qty 2

## 2021-07-23 MED ORDER — SODIUM CHLORIDE 0.9% IV SOLUTION
Freq: Once | INTRAVENOUS | Status: DC
Start: 2021-07-23 — End: 2021-07-31

## 2021-07-23 NOTE — Progress Notes (Signed)
Initial Nutrition Assessment  DOCUMENTATION CODES:   Severe malnutrition in context of chronic illness  INTERVENTION:   - Ensure Enlive po BID, each supplement provides 350 kcal and 20 grams of protein  - Magic Cup TID with meals, each supplement provides 290 kcal and 9 grams of protein  - MVI with minerals daily  - Recommend feeding assistance with meals  NUTRITION DIAGNOSIS:   Severe Malnutrition related to chronic illness (Alzheimer's disease, COPD) as evidenced by severe muscle depletion, severe fat depletion.  GOAL:   Patient will meet greater than or equal to 90% of their needs  MONITOR:   PO intake, Supplement acceptance, Labs, Weight trends, I & O's  REASON FOR ASSESSMENT:   Malnutrition Screening Tool    ASSESSMENT:   87 year old female who presented to the ED on 10/10 after a fall at SNF. PMH of ITP, asthma/COPD, Alzheimer's disease with behavioral disturbances, GERD, HTN, HLD, CKD stage IIIb. Pt found to have R femoral neck fx, thrombocytopenia.  Unable to speak with pt at bedside. Pt moaning in pain and did not respond to RD. RN in room providing care. Per notes, surgery postponed until tomorrow due to continued thrombocytopenia. Discussed pt's diet order with MD. Received permission to order Regular diet with NPO at midnight for surgery tomorrow. RD will order oral nutrition supplements to aid pt in meeting kcal and protein needs.  Reviewed available weight history in chart. Pt's weight has fluctuated between 53-67 kg over the last year. No real trends in weights noted. Pt meets criteria for severe malnutrition based on NFPE.  Meal Completion: 25% x 1 documented meal  Medications reviewed and include: Ensure Enlive BID, IV protonix  Labs reviewed: sodium 132, platelets 67  UOP: 800 ml x 12 hours I/O's: -867 ml x 24 hours  NUTRITION - FOCUSED PHYSICAL EXAM:  Flowsheet Row Most Recent Value  Orbital Region Severe depletion  Upper Arm Region Severe  depletion  Thoracic and Lumbar Region Severe depletion  Buccal Region Moderate depletion  Temple Region Moderate depletion  Clavicle Bone Region Severe depletion  Clavicle and Acromion Bone Region Severe depletion  Scapular Bone Region Unable to assess  Dorsal Hand Severe depletion  Patellar Region Severe depletion  Anterior Thigh Region Severe depletion  Posterior Calf Region Severe depletion  Edema (RD Assessment) None  Hair Reviewed  Eyes Unable to assess  Mouth Unable to assess  Skin Reviewed  Nails Reviewed       Diet Order:   Diet Order             Diet NPO time specified Except for: Sips with Meds  Diet effective midnight           Diet regular Room service appropriate? Yes; Fluid consistency: Thin  Diet effective now                   EDUCATION NEEDS:   Not appropriate for education at this time  Skin:  Skin Assessment: Reviewed RN Assessment  Last BM:  07/23/21 small type 5  Height:   Ht Readings from Last 1 Encounters:  07/22/21 5\' 7"  (1.702 m)    Weight:   Wt Readings from Last 1 Encounters:  07/21/21 60 kg    BMI:  Body mass index is 20.72 kg/m.  Estimated Nutritional Needs:   Kcal:  1450-1650  Protein:  75-90 grams  Fluid:  1.4-1.6 L    Gustavus Bryant, MS, RD, LDN Inpatient Clinical Dietitian Please see AMiON for contact information.

## 2021-07-23 NOTE — Consult Note (Signed)
Reason for Consult:Right hip fx Referring Physician: Wendee Beavers Time called: 0730 Time at bedside: 0902   Ariana [name] is an 87 y.o. female.  HPI: Ariana [name] fell at the SNF where she resides. She had immediate right hip pain and could not get up. She was found by staff and sent to the ED. X-rays showed a right hip fx and orthopedic surgery was consulted. She was transferred to [facility] for definitive care.  Past Medical History:  Diagnosis Date   Alzheimer disease ([place])    Anemia    Anxiety    Asthma    WELL CONTROLLED   Chronic back pain    Cognitive communication deficit    COPD (chronic obstructive pulmonary disease) (HCC)    Depression    Depression    GERD (gastroesophageal reflux disease)    Hiatal hernia    Hypercholesteremia    Hypothyroidism    Iron deficiency    ITP (idiopathic thrombocytopenic purpura)    FOLLOWED BR DR Grayland Ormond   LBBB (left bundle branch block)    Osteoarthritis    Osteoporosis    Restless legs     Past Surgical History:  Procedure Laterality Date   CARPAL TUNNEL RELEASE     ESOPHAGOGASTRODUODENOSCOPY (EGD) WITH PROPOFOL N/A 09/10/2015   Procedure: ESOPHAGOGASTRODUODENOSCOPY (EGD) WITH PROPOFOL;  Surgeon: Lollie Sails, MD;  Location: [facility] ENDOSCOPY;  Service: Endoscopy;  Laterality: N/A;   HIP PINNING,CANNULATED Left 10/31/2020   Procedure: CANNULATED HIP PINNING;  Surgeon: Leim Fabry, MD;  Location: [facility] ORS;  Service: Orthopedics;  Laterality: Left;   IRRIGATION AND DEBRIDEMENT HEMATOMA Right 07/13/2018   Procedure: IRRIGATION AND DEBRIDEMENT HEMATOMA-RIGHT SHIN;  Surgeon: Herbert Pun, MD;  Location: [facility] ORS;  Service: General;  Laterality: Right;   KYPHOPLASTY N/A 05/02/2019   Procedure: L1 KYPHOPLASTY;  Surgeon: Hessie Knows, MD;  Location: [facility] ORS;  Service: Orthopedics;  Laterality: N/A;   LUMBAR DISC SURGERY     REVERSE SHOULDER ARTHROPLASTY Left 06/25/2020   Procedure: REVERSE SHOULDER ARTHROPLASTY;  Surgeon: Corky Mull, MD;  Location: [facility] ORS;  Service: Orthopedics;  Laterality: Left;   SPLENECTOMY, PARTIAL     TOTAL ABDOMINAL HYSTERECTOMY      Family History  Problem Relation Age of Onset   Stroke Mother     Social History:  reports that she has never smoked. She has never used smokeless tobacco. She reports that she does not drink alcohol and does not use drugs.  Allergies:  Allergies  Allergen Reactions   Aspirin Other (See Comments)    Upset stomach   Diazepam Itching    sneezing   Tetanus Toxoids Swelling    Localized superficial swelling of skin   Morphine Itching and Rash    Medications: I have reviewed the patient's current medications.  Results for orders placed or performed during the hospital encounter of 07/21/21 (from the past 48 hour(s))  CBC with Differential/Platelet     Status: Abnormal   Collection Time: 07/21/21  6:51 PM  Result Value Ref Range   WBC 18.8 (H) 4.0 - 10.5 K/uL   RBC 4.17 3.87 - 5.11 MIL/uL   Hemoglobin 13.0 12.0 - 15.0 g/dL   HCT 38.7 36.0 - 46.0 %   MCV 92.8 80.0 - 100.0 fL   MCH 31.2 26.0 - 34.0 pg   MCHC 33.6 30.0 - 36.0 g/dL   RDW 16.8 (H) 11.5 - 15.5 %   Platelets 12 (LL) 150 - 400 K/uL    Comment: SPECIMEN CHECKED FOR CLOTS  REPEATED TO VERIFY PLATELET COUNT CONFIRMED BY SMEAR THIS CRITICAL RESULT HAS VERIFIED AND BEEN CALLED TO [facility] RN BY SUSAN SELF ON 10 10 2022 AT 2001, AND HAS BEEN READ BACK.     nRBC 0.0 0.0 - 0.2 %   Neutrophils Relative % 79 %   Neutro Abs 15.0 (H) 1.7 - 7.7 K/uL   Lymphocytes Relative 13 %   Lymphs Abs 2.4 0.7 - 4.0 K/uL   Monocytes Relative 5 %   Monocytes Absolute 1.0 0.1 - 1.0 K/uL   Eosinophils Relative 1 %   Eosinophils Absolute 0.2 0.0 - 0.5 K/uL   Basophils Relative 1 %   Basophils Absolute 0.1 0.0 - 0.1 K/uL   RBC Morphology MORPHOLOGY UNREMARKABLE    Immature Granulocytes 1 %   Abs Immature Granulocytes 0.16 (H) 0.00 - 0.07 K/uL    Comment: Performed at [facility], [address],  [place], [place] [address]  Comprehensive metabolic panel     Status: Abnormal   Collection Time: 07/21/21  6:51 PM  Result Value Ref Range   Sodium 139 135 - 145 mmol/L   Potassium 3.0 (L) 3.5 - 5.1 mmol/L   Chloride 103 98 - 111 mmol/L   CO2 28 22 - 32 mmol/L   Glucose, Bld 136 (H) 70 - 99 mg/dL    Comment: Glucose reference range applies only to samples taken after fasting for at least 8 hours.   BUN 16 8 - 23 mg/dL   Creatinine, Ser 0.88 0.44 - 1.00 mg/dL   Calcium 8.5 (L) 8.9 - 10.3 mg/dL   Total Protein 7.0 6.5 - 8.1 g/dL   Albumin 3.7 3.5 - 5.0 g/dL   AST 25 15 - 41 U/L   ALT 24 0 - 44 U/L   Alkaline Phosphatase 106 38 - 126 U/L   Total Bilirubin 0.7 0.3 - 1.2 mg/dL   GFR, Estimated >60 >60 mL/min    Comment: (NOTE) Calculated using the CKD-EPI Creatinine Equation (2021)    Anion gap 8 5 - 15    Comment: Performed at [facility] [address], [place], [place] [address]  Resp Panel by RT-PCR (Flu A&B, Covid) Nasopharyngeal Swab     Status: None   Collection Time: 07/21/21  7:54 PM   Specimen: Nasopharyngeal Swab; Nasopharyngeal(NP) swabs in vial transport medium  Result Value Ref Range   SARS Coronavirus 2 by RT PCR NEGATIVE NEGATIVE    Comment: (NOTE) SARS-CoV-2 target nucleic acids are NOT DETECTED.  The SARS-CoV-2 RNA is generally detectable in upper respiratory specimens during the acute phase of infection. The lowest concentration of SARS-CoV-2 viral copies this assay can detect is 138 copies/mL. A negative result does not preclude SARS-Cov-2 infection and should not be used as the sole basis for treatment or other patient management decisions. A negative result may occur with  improper specimen collection/handling, submission of specimen other than nasopharyngeal swab, presence of viral mutation(s) within the areas targeted by this assay, and inadequate number of viral copies(<138 copies/mL). A negative result must be combined with clinical observations, patient  history, and epidemiological information. The expected result is Negative.  Fact Sheet for Patients:  EntrepreneurPulse.com.au  Fact Sheet for Healthcare Providers:  IncredibleEmployment.be  This test is no t yet approved or cleared by the [place] FDA and  has been authorized for detection and/or diagnosis of SARS-CoV-2 by FDA under an Emergency Use Authorization (EUA). This EUA will remain  in effect (meaning this test can be used)  for the duration of the COVID-19 declaration under Section 564(b)(1) of the Act, 21 U.S.C.section 360bbb-3(b)(1), unless the authorization is terminated  or revoked sooner.       Influenza A by PCR NEGATIVE NEGATIVE   Influenza B by PCR NEGATIVE NEGATIVE    Comment: (NOTE) The Xpert Xpress SARS-CoV-2/FLU/RSV plus assay is intended as an aid in the diagnosis of influenza from Nasopharyngeal swab specimens and should not be used as a sole basis for treatment. Nasal washings and aspirates are unacceptable for Xpert Xpress SARS-CoV-2/FLU/RSV testing.  Fact Sheet for Patients: EntrepreneurPulse.com.au  Fact Sheet for Healthcare Providers: IncredibleEmployment.be  This test is not yet approved or cleared by the [place] FDA and has been authorized for detection and/or diagnosis of SARS-CoV-2 by FDA under an Emergency Use Authorization (EUA). This EUA will remain in effect (meaning this test can be used) for the duration of the COVID-19 declaration under Section 564(b)(1) of the Act, 21 U.S.C. section 360bbb-3(b)(1), unless the authorization is terminated or revoked.  Performed at [facility], [address] [place], [place] [address]   Comprehensive metabolic panel     Status: Abnormal   Collection Time: 07/22/21  4:07 AM  Result Value Ref Range   Sodium 136 135 - 145 mmol/L   Potassium 3.8 3.5 - 5.1 mmol/L    Comment: DELTA CHECK NOTED   Chloride 104 98 - 111  mmol/L   CO2 27 22 - 32 mmol/L   Glucose, Bld 110 (H) 70 - 99 mg/dL    Comment: Glucose reference range applies only to samples taken after fasting for at least 8 hours.   BUN 14 8 - 23 mg/dL   Creatinine, Ser 0.67 0.44 - 1.00 mg/dL   Calcium 8.5 (L) 8.9 - 10.3 mg/dL   Total Protein 8.1 6.5 - 8.1 g/dL   Albumin 3.3 (L) 3.5 - 5.0 g/dL   AST 20 15 - 41 U/L   ALT 19 0 - 44 U/L   Alkaline Phosphatase 89 38 - 126 U/L   Total Bilirubin 0.8 0.3 - 1.2 mg/dL   GFR, Estimated >60 >60 mL/min    Comment: (NOTE) Calculated using the CKD-EPI Creatinine Equation (2021)    Anion gap 5 5 - 15    Comment: Performed at [facility], [address], [place], [place] [address]  CBC     Status: Abnormal   Collection Time: 07/22/21  4:07 AM  Result Value Ref Range   WBC 14.0 (H) 4.0 - 10.5 K/uL   RBC 3.77 (L) 3.87 - 5.11 MIL/uL   Hemoglobin 11.6 (L) 12.0 - 15.0 g/dL   HCT 35.3 (L) 36.0 - 46.0 %   MCV 93.6 80.0 - 100.0 fL   MCH 30.8 26.0 - 34.0 pg   MCHC 32.9 30.0 - 36.0 g/dL   RDW 16.6 (H) 11.5 - 15.5 %   Platelets 27 (LL) 150 - 400 K/uL    Comment: SPECIMEN CHECKED FOR CLOTS Immature Platelet Fraction may be clinically indicated, consider ordering this additional test [id number] PLATELET COUNT CONFIRMED BY SMEAR THIS CRITICAL RESULT HAS VERIFIED AND BEEN CALLED TO [facility],A BY BOBBIE MATTHEWS ON 10 11 2022 AT 0508, AND HAS BEEN READ BACK.     nRBC 0.0 0.0 - 0.2 %    Comment: Performed at [facility], [address], [place],  [address]  Protime-INR     Status: None   Collection Time: 07/22/21  4:07 AM  Result Value Ref Range   Prothrombin Time 13.5 11.4 -  15.2 seconds   INR 1.0 0.8 - 1.2    Comment: (NOTE) INR goal varies based on device and disease states. Performed at [facility], [address], [place], [place] [address]   APTT     Status: None   Collection Time: 07/22/21  4:07 AM  Result Value Ref Range   aPTT 27 24 - 36 seconds    Comment: Performed at [facility], [address] [place], [place] [address]  Magnesium     Status: None   Collection Time: 07/22/21  4:07 AM  Result Value Ref Range   Magnesium 1.8 1.7 - 2.4 mg/dL    Comment: Performed at [facility], [address] [place], [place] [address]  Phosphorus     Status: None   Collection Time: 07/22/21  4:07 AM  Result Value Ref Range   Phosphorus 2.7 2.5 - 4.6 mg/dL    Comment: Performed at [facility], [address] [place], [place] [address]  Basic metabolic panel     Status: Abnormal   Collection Time: 07/23/21  3:56 AM  Result Value Ref Range   Sodium 132 (L) 135 - 145 mmol/L   Potassium 4.3 3.5 - 5.1 mmol/L   Chloride 102 98 - 111 mmol/L   CO2 23 22 - 32 mmol/L   Glucose, Bld 93 70 - 99 mg/dL    Comment: Glucose reference range applies only to samples taken after fasting for at least 8 hours.   BUN 12 8 - 23 mg/dL   Creatinine, Ser 0.61 0.44 - 1.00 mg/dL   Calcium 8.6 (L) 8.9 - 10.3 mg/dL   GFR, Estimated >60 >60 mL/min    Comment: (NOTE) Calculated using the CKD-EPI Creatinine Equation (2021)    Anion gap 7 5 - 15    Comment: Performed at [facility] [address] [place], [place] [address]  CBC     Status: Abnormal   Collection Time: 07/23/21  3:56 AM  Result Value Ref Range   WBC 15.6 (H) 4.0 - 10.5 K/uL   RBC 3.96 3.87 - 5.11 MIL/uL   Hemoglobin 12.0 12.0 - 15.0 g/dL   HCT 35.9 (L) 36.0 - 46.0 %   MCV 90.7 80.0 - 100.0 fL   MCH 30.3 26.0 - 34.0 pg   MCHC 33.4 30.0 - 36.0 g/dL   RDW 16.4 (H) 11.5 - 15.5 %   Platelets PLATELET CLUMPS NOTED ON SMEAR, UNABLE TO ESTIMATE 150 - 400 K/uL    Comment: Immature Platelet Fraction may be clinically indicated, consider ordering this additional test [id number] REPEATED TO VERIFY    nRBC 0.0 0.0 - 0.2 %    Comment: Performed at [facility], [place] [address] [place], [place] [address]  Platelet count     Status: Abnormal   Collection Time: 07/23/21  8:03 AM  Result Value Ref Range   Platelets 67 (L) 150 - 400 K/uL     Comment: Immature Platelet Fraction may be clinically indicated, consider ordering this additional test [id number] REPEATED TO VERIFY PLATELET COUNT CONFIRMED BY SMEAR Performed at [facility], [place] [address], [place], [place] [address]     CT HEAD WO CONTRAST (5MM)  Result Date: 07/21/2021 CLINICAL DATA:  Fall, confusion EXAM: CT HEAD WITHOUT CONTRAST TECHNIQUE: Contiguous axial images were obtained from the base of the skull through the vertex without intravenous contrast. COMPARISON:  07/13/2021 FINDINGS: Brain: Again seen is the left anterior frontal intraparenchymal hemorrhage measuring 2.0 x  1.6 cm compared to 2.1 x 2.0 cm previously. Probable small amount of subarachnoid blood within the left sylvian fissure posteriorly, not definitively seen on prior study. No acute infarction. No mass effect or midline shift. No hydrocephalus. Extensive chronic small vessel disease throughout the deep white matter. Diffuse cerebral atrophy. Vascular: No hyperdense vessel or unexpected calcification. Skull: No acute calvarial abnormality. Sinuses/Orbits: No acute findings Other: Soft tissue swelling in the right scalp. IMPRESSION: Slight decreased size of the anterior left frontal intraparenchymal hematoma since prior study. Focus of high density in the left sylvian fissure could reflect small acute subarachnoid hemorrhage, not definitively seen on prior study. These results were called by telephone at the time of interpretation on 07/21/2021 at 6:10 pm to provider Dr. Roderic Palau, Who verbally acknowledged these results. Electronically Signed   By: Rolm Baptise M.D.   On: 07/21/2021 18:13   CT Hip Right Wo Contrast  Result Date: 07/21/2021 CLINICAL DATA:  [place], right hip pain EXAM: CT OF THE RIGHT HIP WITHOUT CONTRAST TECHNIQUE: Multidetector CT imaging of the right hip was performed according to the standard protocol. Multiplanar CT image reconstructions were also generated. COMPARISON:  07/21/2021  FINDINGS: Bones/Joint/Cartilage There is an impacted subcapital right femoral neck fracture. Femoral head remains aligned anatomic [place] with the acetabulum. No other acute displaced fractures. Postsurgical changes from lower lumbar posterior laminectomy and fusion. Ligaments Suboptimally assessed by CT. Muscles and Tendons Unremarkable. Soft tissues Mild subcutaneous fat stranding in the right gluteal region may reflect posttraumatic change. No fluid collection or hematoma. Visualized intrapelvic structures are grossly unremarkable. Reconstructed images demonstrate no additional findings. IMPRESSION: 1. Impacted subcapital right femoral neck fracture. Electronically Signed   By: Randa Ngo M.D.   On: 07/21/2021 19:09   DG Hip Unilat W or Wo Pelvis 2-3 Views Right  Result Date: 07/21/2021 CLINICAL DATA:  Status post fall with right hip pain. EXAM: DG HIP (WITH OR WITHOUT PELVIS) 2-3V RIGHT COMPARISON:  None. FINDINGS: Foreshortening of the right femoral neck is seen at the junction of the right femoral head. There is no evidence of dislocation. Moderate severity degenerative changes are seen in the form of joint space narrowing and acetabular sclerosis. Three radiopaque surgical screws are seen within the proximal left femur with bilateral pedicle screws noted along the lower lumbar spine. IMPRESSION: Findings suspicious for a nondisplaced fracture of the right femoral head and neck. CT correlation is recommended. Electronically Signed   By: Virgina Norfolk M.D.   On: 07/21/2021 17:36    Review of Systems  HENT:  Negative for ear discharge, ear pain, hearing loss and tinnitus.   Eyes:  Negative for photophobia and pain.  Respiratory:  Negative for cough and shortness of breath.   Cardiovascular:  Negative for chest pain.  Gastrointestinal:  Negative for abdominal pain, nausea and vomiting.  Genitourinary:  Negative for dysuria, flank pain, frequency and urgency.  Musculoskeletal:  Positive for  arthralgias (Right hip). Negative for back pain, myalgias and neck pain.  Neurological:  Negative for dizziness and headaches.  Hematological:  Does not bruise/bleed easily.  Psychiatric/Behavioral:  The patient is not nervous/anxious.   Blood pressure (!) 145/81, pulse 80, temperature 98.9 F (37.2 C), temperature source Oral, resp. rate 19, height 5\' 7"  (1.702 m), weight 60 kg, SpO2 99 %. Physical Exam Constitutional:      General: She is not in acute distress.    Appearance: She is well-developed. She is not diaphoretic.  HENT:     Head: Normocephalic and  atraumatic.  Eyes:     General: No scleral icterus.       Right eye: No discharge.        Left eye: No discharge.     Conjunctiva/sclera: Conjunctivae normal.  Cardiovascular:     Rate and Rhythm: Normal rate and regular rhythm.  Pulmonary:     Effort: Pulmonary effort is normal. No respiratory distress.  Musculoskeletal:     Cervical back: Normal range of motion.     Comments: RLE No traumatic wounds, ecchymosis, or rash  Mild TTP hip  No knee or ankle effusion  Knee stable to varus/ valgus and anterior/posterior stress  Sens DPN, SPN, TN intact  Motor EHL, ext, flex, evers 5/5  DP 2+, PT 1+, No significant edema  Skin:    General: Skin is warm and dry.  Neurological:     Mental Status: She is alert.  Psychiatric:        Mood and Affect: Mood normal.        Behavior: Behavior normal.    Assessment/Plan: Right hip fx -- Plan THA today by Dr. Erlinda Hong if we can get her plts acceptable. Multiple medical problems including ITP, asthma/COPD, Alzheimer disease with behavioral disturbances, gastroesophageal reflux disease, hypertension, hypercholesterolemia, hypothyroidism, and chronic kidney disease stage IIIb -- per primary service    Lisette Abu, PA-C Orthopedic Surgery [phone] 07/23/2021, 9:17 AM

## 2021-07-23 NOTE — Progress Notes (Signed)
PT Cancellation Note  Patient Details Name: Ariana [name] MRN: [id number] DOB: 09/02/1933   Cancelled Treatment:    Reason Eval/Treat Not Completed: Patient not medically ready Patient to go to sx today. PT will evaluate post op per order.   Ki Luckman A. Gilford Rile PT, DPT Acute Rehabilitation Services Pager [phone] Office [phone]    Linna Hoff 07/23/2021, 10:31 AM

## 2021-07-23 NOTE — Progress Notes (Signed)
OT Cancellation Note  Patient Details Name: Ariana [name] MRN: [id number] DOB: [birth year]   Cancelled Treatment:    Reason Eval/Treat Not Completed: Patient not medically ready (Pt to go to sx today). OT will evaluate post-op as ordered by ortho MD.  Merri Ray [name] 07/23/2021, 9:59 AM  Jesse Sans OTR/L Acute Rehabilitation Services Pager: [phone] Office: [phone]

## 2021-07-23 NOTE — Progress Notes (Signed)
I went by the patient's room this afternoon to hopefully speak with caregiver who was not present.  The patient is pleasantly demented.  She is guarding from any movement of her right hip.  At this point we will need to obtain surgical consent from patient's caregiver.  We will continue to follow her platelet count with plans for surgery Thursday afternoon as long as the platelet count is acceptable.  N.p.o. after midnight.

## 2021-07-23 NOTE — Progress Notes (Addendum)
PROGRESS NOTE  Emmanuel Ercole OIT:[id number] DOB: [birth year]   PCP: Caprice Renshaw, MD  Patient is from: Nursing home.  DOA: 07/21/2021 LOS: 2  Chief complaints:  Chief Complaint  Patient presents with   Fall    Pt is [place] and pt fell, found in floor with hematoma to back of head.  See and treated at [facility] last month for fall with brain bleed.  EMS reports that pt will not straight right leg, no rotation noted.  Pt is confused and her normal baseline.  VSS.         Brief Narrative / Interim history: 87 year old F with PMH of ITP, asthma/COPD, Alzheimer's dementia, SAH, hypothyroidism, HTN, HLD and GERD brought to [facility] ED by EMS with right hip pain and head pain after she sustained a fall, and found to have impacted subcapital right femoral neck fracture.  She was transferred to [facility] for surgical intervention.  CT head showed slightly decreased size of anterior left frontal intraparenchymal hematoma.  Oncology/hematology consulted about thrombocytopenia and recommended IVIG and Nplate.  Subjective: Seen and examined earlier this morning.  No major events overnight of this morning.  No complaints but not a great historian.  She denies pain, shortness of breath, GI or UTI symptoms.  Seems to be coughing intermittently but not new per patient.  Objective: Vitals:   07/22/21 2354 07/23/21 0320 07/23/21 0719 07/23/21 1126  BP: (!) 150/88 (!) 144/84 (!) 145/81 (!) 141/82  Pulse: 75 84 80 86  Resp:  14 19 19   Temp:  98.1 F (36.7 C) 98.9 F (37.2 C) 98.2 F (36.8 C)  TempSrc:  Axillary Oral Oral  SpO2:  98% 99% 96%  Weight:      Height:        Intake/Output Summary (Last 24 hours) at 07/23/2021 1246 Last data filed at 07/23/2021 0900 Gross per 24 hour  Intake 282.06 ml  Output 400 ml  Net -117.94 ml   Filed Weights   07/21/21 1502  Weight: 60 kg    Examination:  GENERAL: Frail looking elderly female. HEENT: MMM.  Vision and hearing grossly intact.  NECK:  Supple.  No apparent JVD.  RESP: 98% on RA.  No IWOB.  Fair aeration bilaterally. CVS:  RRR. Heart sounds normal.  ABD/GI/GU: BS+. Abd soft, NTND.  MSK/EXT:  Moves extremities.  Significant muscle mass and subcu fat loss. SKIN: no apparent skin lesion or wound NEURO: A awake and alert.  Oriented to self and place.  No apparent focal neuro deficit but limited exam. PSYCH: Calm. Normal affect.   Procedures:  None  Microbiology summarized: [id number]-[id number] and influenza PCR nonreactive.  Assessment & Plan: Fall at nursing facility Impacted subcapital right femoral neck fracture likely due to fall -Plan for surgical repair by orthopedic surgery once thrombocytopenia improves -Pain control-with as needed fentanyl and scheduled Tylenol -PT/OT after surgical repair -Fall precaution   Thrombocytopenia/ITP-s/p IVIG and Nplate on 76/80/8811.  Improving. Recent Labs  Lab 07/21/21 1851 07/22/21 0407 07/23/21 0356 07/23/21 0803  PLT 12* 27* PLATELET CLUMPS NOTED ON SMEAR, UNABLE TO ESTIMATE 67*  -Continue monitoring   History of subarachnoid hemorrhage-improved on CT head -No indication for NS intervention per Dr. Christella Noa with NS  CKD-3B: Ruled out.  Leukocytosis/bandemia: Likely demargination.   -Continue monitoring  Hypokalemia: Resolved  Hyperglycemia: Resolved.  No history of DM   Prolonged QTc (511 ms)-exaggerated by wide QRS due to LBBB.  Hypothyroidism -Continue Synthroid   Essential hypertension: Normotensive. -Continue amlodipine  History of COPD/asthma: Stable -Continue home inhalers  Hyponatremia: Na 132.  Likely hypovolemic.  She is also on Celexa which could contribute -Agree with IV NS  GERD -Continue Protonix  Alzheimer's dementia without behavioral disturbance -Continue Namenda -Reorientation and delirium precautions  Anxiety: Stable. -Continue home Lexapro, Lamictal, gabapentin, BuSpar and Xanax  Moderate protein calorie malnutrition: Significant  muscle mass and subcu fat loss. Body mass index is 20.72 kg/m.  -Consult dietitian       DVT prophylaxis:  SCDs Start: 07/21/21 2316  Code Status: DNR/DNI Family Communication: Patient and/or RN. Available if any question.  Level of care: Progressive Status is: Inpatient  Remains inpatient appropriate because:Ongoing diagnostic testing needed not appropriate for outpatient work up, Unsafe d/c plan, and Inpatient level of care appropriate due to severity of illness  Dispo: The patient is from:  Nursing home              Anticipated d/c is to:  Nursing home              Patient currently is not medically stable to d/c.   Difficult to place patient No       Consultants:  Orthopedic surgery Oncology/hematology   Sch Meds:  Scheduled Meds:  sodium chloride   Intravenous Once   acetaminophen  650 mg Oral Q6H WA   amLODipine  10 mg Oral Daily   feeding supplement  237 mL Oral BID BM   levothyroxine  50 mcg Oral QAC breakfast   memantine  10 mg Oral BID   pantoprazole (PROTONIX) IV  40 mg Intravenous Q24H   rosuvastatin  5 mg Oral QPM   Continuous Infusions:  sodium chloride Stopped (07/22/21 1502)   PRN Meds:.sodium chloride, ALPRAZolam, fentaNYL (SUBLIMAZE) injection  Antimicrobials: Anti-infectives (From admission, onward)    None        I have personally reviewed the following labs and images: CBC: Recent Labs  Lab 07/21/21 1851 07/22/21 0407 07/23/21 0356 07/23/21 0803  WBC 18.8* 14.0* 15.6*  --   NEUTROABS 15.0*  --   --   --   HGB 13.0 11.6* 12.0  --   HCT 38.7 35.3* 35.9*  --   MCV 92.8 93.6 90.7  --   PLT 12* 27* PLATELET CLUMPS NOTED ON SMEAR, UNABLE TO ESTIMATE 67*   BMP &GFR Recent Labs  Lab 07/21/21 1851 07/22/21 0407 07/23/21 0356  NA 139 136 132*  K 3.0* 3.8 4.3  CL 103 104 102  CO2 28 27 23   GLUCOSE 136* 110* 93  BUN 16 14 12   CREATININE 0.88 0.67 0.61  CALCIUM 8.5* 8.5* 8.6*  MG  --  1.8  --   PHOS  --  2.7  --     Estimated Creatinine Clearance: 48.7 mL/min (by C-G formula based on SCr of 0.61 mg/dL). Liver & Pancreas: Recent Labs  Lab 07/21/21 1851 07/22/21 0407  AST 25 20  ALT 24 19  ALKPHOS 106 89  BILITOT 0.7 0.8  PROT 7.0 8.1  ALBUMIN 3.7 3.3*   No results for input(s): LIPASE, AMYLASE in the last 168 hours. No results for input(s): AMMONIA in the last 168 hours. Diabetic: No results for input(s): HGBA1C in the last 72 hours. No results for input(s): GLUCAP in the last 168 hours. Cardiac Enzymes: No results for input(s): CKTOTAL, CKMB, CKMBINDEX, TROPONINI in the last 168 hours. No results for input(s): PROBNP in the last 8760 hours. Coagulation Profile: Recent Labs  Lab 07/22/21 0407  INR  1.0   Thyroid Function Tests: No results for input(s): TSH, T4TOTAL, FREET4, T3FREE, THYROIDAB in the last 72 hours. Lipid Profile: No results for input(s): CHOL, HDL, LDLCALC, TRIG, CHOLHDL, LDLDIRECT in the last 72 hours. Anemia Panel: No results for input(s): VITAMINB12, FOLATE, FERRITIN, TIBC, IRON, RETICCTPCT in the last 72 hours. Urine analysis:    Component Value Date/Time   COLORURINE AMBER (A) 07/13/2021 1011   APPEARANCEUR CLOUDY (A) 07/13/2021 1011   APPEARANCEUR Clear 09/03/2014 1200   LABSPEC 1.025 07/13/2021 1011   LABSPEC 1.018 09/03/2014 1200   PHURINE 5.0 07/13/2021 1011   GLUCOSEU NEGATIVE 07/13/2021 1011   GLUCOSEU Negative 09/03/2014 1200   HGBUR SMALL (A) 07/13/2021 1011   BILIRUBINUR NEGATIVE 07/13/2021 1011   BILIRUBINUR Negative 09/03/2014 1200   KETONESUR 5 (A) 07/13/2021 1011   PROTEINUR 30 (A) 07/13/2021 1011   NITRITE NEGATIVE 07/13/2021 1011   LEUKOCYTESUR SMALL (A) 07/13/2021 1011   LEUKOCYTESUR Negative 09/03/2014 1200   Sepsis Labs: Invalid input(s): PROCALCITONIN, [place]  Microbiology: Recent Results (from the past 240 hour(s))  Resp Panel by RT-PCR (Flu A&B, Covid) Nasopharyngeal Swab     Status: None   Collection Time: 07/21/21  7:54  PM   Specimen: Nasopharyngeal Swab; Nasopharyngeal(NP) swabs in vial transport medium  Result Value Ref Range Status   SARS Coronavirus 2 by RT PCR NEGATIVE NEGATIVE Final    Comment: (NOTE) SARS-CoV-2 target nucleic acids are NOT DETECTED.  The SARS-CoV-2 RNA is generally detectable in upper respiratory specimens during the acute phase of infection. The lowest concentration of SARS-CoV-2 viral copies this assay can detect is 138 copies/mL. A negative result does not preclude SARS-Cov-2 infection and should not be used as the sole basis for treatment or other patient management decisions. A negative result may occur with  improper specimen collection/handling, submission of specimen other than nasopharyngeal swab, presence of viral mutation(s) within the areas targeted by this assay, and inadequate number of viral copies(<138 copies/mL). A negative result must be combined with clinical observations, patient history, and epidemiological information. The expected result is Negative.  Fact Sheet for Patients:  EntrepreneurPulse.com.au  Fact Sheet for Healthcare Providers:  IncredibleEmployment.be  This test is no t yet approved or cleared by the [place] FDA and  has been authorized for detection and/or diagnosis of SARS-CoV-2 by FDA under an Emergency Use Authorization (EUA). This EUA will remain  in effect (meaning this test can be used) for the duration of the COVID-19 declaration under Section 564(b)(1) of the Act, 21 U.S.C.section 360bbb-3(b)(1), unless the authorization is terminated  or revoked sooner.       Influenza A by PCR NEGATIVE NEGATIVE Final   Influenza B by PCR NEGATIVE NEGATIVE Final    Comment: (NOTE) The Xpert Xpress SARS-CoV-2/FLU/RSV plus assay is intended as an aid in the diagnosis of influenza from Nasopharyngeal swab specimens and should not be used as a sole basis for treatment. Nasal washings and aspirates are  unacceptable for Xpert Xpress SARS-CoV-2/FLU/RSV testing.  Fact Sheet for Patients: EntrepreneurPulse.com.au  Fact Sheet for Healthcare Providers: IncredibleEmployment.be  This test is not yet approved or cleared by the [place] FDA and has been authorized for detection and/or diagnosis of SARS-CoV-2 by FDA under an Emergency Use Authorization (EUA). This EUA will remain in effect (meaning this test can be used) for the duration of the COVID-19 declaration under Section 564(b)(1) of the Act, 21 U.S.C. section 360bbb-3(b)(1), unless the authorization is terminated or revoked.  Performed at [facility], [phone]  [address] [place], [place] [address]     Radiology Studies: No results found.    Cheyeanne Roadcap T. [place]  If 7PM-7AM, please contact night-coverage [email or web address] 07/23/2021, 12:46 PM

## 2021-07-24 ENCOUNTER — Inpatient Hospital Stay (HOSPITAL_COMMUNITY): Payer: Medicare HMO | Admitting: Anesthesiology

## 2021-07-24 ENCOUNTER — Encounter (HOSPITAL_COMMUNITY)
Admission: EM | Disposition: A | Discharge status: Skilled Nursing Facility | Payer: Self-pay | Source: Skilled Nursing Facility | Attending: Student

## 2021-07-24 ENCOUNTER — Encounter (HOSPITAL_COMMUNITY): Payer: Self-pay | Admitting: Internal Medicine

## 2021-07-24 ENCOUNTER — Inpatient Hospital Stay (HOSPITAL_COMMUNITY): Payer: Medicare HMO

## 2021-07-24 DIAGNOSIS — S72001A Fracture of unspecified part of neck of right femur, initial encounter for closed fracture: Secondary | ICD-10-CM

## 2021-07-24 DIAGNOSIS — D693 Immune thrombocytopenic purpura: Secondary | ICD-10-CM | POA: Diagnosis not present

## 2021-07-24 DIAGNOSIS — W19XXXA Unspecified fall, initial encounter: Secondary | ICD-10-CM | POA: Diagnosis not present

## 2021-07-24 DIAGNOSIS — E039 Hypothyroidism, unspecified: Secondary | ICD-10-CM | POA: Diagnosis not present

## 2021-07-24 DIAGNOSIS — E43 Unspecified severe protein-calorie malnutrition: Secondary | ICD-10-CM | POA: Insufficient documentation

## 2021-07-24 HISTORY — PX: HIP PINNING,CANNULATED: SHX1758

## 2021-07-24 LAB — RENAL FUNCTION PANEL
Albumin: 2.8 g/dL — ABNORMAL LOW (ref 3.5–5.0)
Anion gap: 9 (ref 5–15)
BUN: 15 mg/dL (ref 8–23)
CO2: 21 mmol/L — ABNORMAL LOW (ref 22–32)
Calcium: 8.7 mg/dL — ABNORMAL LOW (ref 8.9–10.3)
Chloride: 104 mmol/L (ref 98–111)
Creatinine, Ser: 0.68 mg/dL (ref 0.44–1.00)
GFR, Estimated: >60 mL/min (ref >60)
Glucose, Bld: 85 mg/dL (ref 70–99)
Phosphorus: 3.4 mg/dL (ref 2.5–4.6)
Potassium: 3.6 mmol/L (ref 3.5–5.1)
Sodium: 134 mmol/L — ABNORMAL LOW (ref 135–145)

## 2021-07-24 LAB — CBC
HCT: 36.2 % (ref 36.0–46.0)
Hemoglobin: 12.2 g/dL (ref 12.0–15.0)
MCH: 30.3 pg (ref 26.0–34.0)
MCHC: 33.7 g/dL (ref 30.0–36.0)
MCV: 90 fL (ref 80.0–100.0)
Platelets: 68 10*3/uL — ABNORMAL LOW (ref 150–400)
RBC: 4.02 MIL/uL (ref 3.87–5.11)
RDW: 16.5 % — ABNORMAL HIGH (ref 11.5–15.5)
WBC: 11.7 10*3/uL — ABNORMAL HIGH (ref 4.0–10.5)
nRBC: 0 % (ref 0.0–0.2)

## 2021-07-24 LAB — CK: Total CK: 28 U/L — ABNORMAL LOW (ref 38–234)

## 2021-07-24 LAB — MAGNESIUM: Magnesium: 2 mg/dL (ref 1.7–2.4)

## 2021-07-24 SURGERY — FIXATION, FEMUR, NECK, PERCUTANEOUS, USING SCREW
Anesthesia: General | Site: Hip | Laterality: Right

## 2021-07-24 MED ORDER — 0.9 % SODIUM CHLORIDE (POUR BTL) OPTIME
TOPICAL | Status: DC | PRN
  Administered 2021-07-24: 1000 mL

## 2021-07-24 MED ORDER — SUGAMMADEX SODIUM 500 MG/5ML IV SOLN
INTRAVENOUS | Status: DC | PRN
  Administered 2021-07-24: 200 mg via INTRAVENOUS

## 2021-07-24 MED ORDER — ONDANSETRON HCL 4 MG/2ML IJ SOLN: 4.0000 mg | Freq: Four times a day (QID) | INTRAMUSCULAR | Status: DC | PRN

## 2021-07-24 MED ORDER — METHOCARBAMOL 500 MG PO TABS
500.0000 mg | ORAL_TABLET | Freq: Four times a day (QID) | ORAL | Status: DC | PRN
  Filled 2021-07-24: qty 1

## 2021-07-24 MED ORDER — FENTANYL CITRATE (PF) 250 MCG/5ML IJ SOLN
INTRAMUSCULAR | Status: AC
  Filled 2021-07-24: qty 5

## 2021-07-24 MED ORDER — CEFAZOLIN SODIUM-DEXTROSE 2-4 GM/100ML-% IV SOLN
INTRAVENOUS | Status: AC
  Filled 2021-07-24: qty 100

## 2021-07-24 MED ORDER — ROCURONIUM BROMIDE 10 MG/ML (PF) SYRINGE
PREFILLED_SYRINGE | INTRAVENOUS | Status: AC
  Filled 2021-07-24: qty 10

## 2021-07-24 MED ORDER — CHLORHEXIDINE GLUCONATE 0.12 % MT SOLN: 15.0000 mL | Freq: Once | OROMUCOSAL | Status: DC

## 2021-07-24 MED ORDER — ALUM & MAG HYDROXIDE-SIMETH 200-200-20 MG/5ML PO SUSP: 30.0000 mL | ORAL | Status: DC | PRN

## 2021-07-24 MED ORDER — MENTHOL 3 MG MT LOZG: 1.0000 | LOZENGE | OROMUCOSAL | Status: DC | PRN

## 2021-07-24 MED ORDER — ACETAMINOPHEN 325 MG PO TABS
325.0000 mg | ORAL_TABLET | Freq: Four times a day (QID) | ORAL | Status: DC | PRN
  Filled 2021-07-24: qty 1

## 2021-07-24 MED ORDER — LIDOCAINE HCL (CARDIAC) PF 100 MG/5ML IV SOSY
PREFILLED_SYRINGE | INTRAVENOUS | Status: DC | PRN
  Administered 2021-07-24: 30 mg via INTRAVENOUS

## 2021-07-24 MED ORDER — POTASSIUM CHLORIDE IN NACL 20-0.9 MEQ/L-% IV SOLN: INTRAVENOUS | Status: AC

## 2021-07-24 MED ORDER — PHENYLEPHRINE HCL-NACL 20-0.9 MG/250ML-% IV SOLN
INTRAVENOUS | Status: DC | PRN
  Administered 2021-07-24: 50 ug/min via INTRAVENOUS

## 2021-07-24 MED ORDER — CHLORHEXIDINE GLUCONATE 4 % EX LIQD: 60.0000 mL | Freq: Once | CUTANEOUS | Status: DC

## 2021-07-24 MED ORDER — SORBITOL 70 % SOLN: 30.0000 mL | Freq: Every day | Status: DC | PRN

## 2021-07-24 MED ORDER — PHENOL 1.4 % MT LIQD: 1.0000 | OROMUCOSAL | Status: DC | PRN

## 2021-07-24 MED ORDER — TRANEXAMIC ACID-NACL 1000-0.7 MG/100ML-% IV SOLN
1000.0000 mg | INTRAVENOUS | Status: AC
  Administered 2021-07-24: 1000 mg via INTRAVENOUS

## 2021-07-24 MED ORDER — PROPOFOL 10 MG/ML IV BOLUS
INTRAVENOUS | Status: AC
  Filled 2021-07-24: qty 20

## 2021-07-24 MED ORDER — CEFAZOLIN SODIUM-DEXTROSE 2-4 GM/100ML-% IV SOLN
2.0000 g | INTRAVENOUS | Status: AC
  Administered 2021-07-24: 2 g via INTRAVENOUS

## 2021-07-24 MED ORDER — LACTATED RINGERS IV SOLN: INTRAVENOUS | Status: DC

## 2021-07-24 MED ORDER — OXYCODONE HCL 5 MG PO TABS
5.0000 mg | ORAL_TABLET | ORAL | Status: DC | PRN
  Administered 2021-07-24 – 2021-07-25 (×3): 10 mg via ORAL
  Administered 2021-07-26 – 2021-07-30 (×4): 5 mg via ORAL
  Filled 2021-07-24 (×3): qty 1
  Filled 2021-07-24 (×2): qty 2
  Filled 2021-07-24 (×2): qty 1

## 2021-07-24 MED ORDER — PROPOFOL 10 MG/ML IV BOLUS
INTRAVENOUS | Status: DC | PRN
  Administered 2021-07-24: 20 mg via INTRAVENOUS
  Administered 2021-07-24 (×2): 10 mg via INTRAVENOUS
  Administered 2021-07-24: 40 mg via INTRAVENOUS

## 2021-07-24 MED ORDER — HYDROCODONE-ACETAMINOPHEN 7.5-325 MG PO TABS: 1.0000 | ORAL_TABLET | Freq: Three times a day (TID) | ORAL | 0 refills | Status: AC | PRN

## 2021-07-24 MED ORDER — POLYETHYLENE GLYCOL 3350 17 G PO PACK: 17.0000 g | PACK | Freq: Every day | ORAL | Status: DC | PRN

## 2021-07-24 MED ORDER — ROCURONIUM BROMIDE 100 MG/10ML IV SOLN
INTRAVENOUS | Status: DC | PRN
  Administered 2021-07-24: 40 mg via INTRAVENOUS

## 2021-07-24 MED ORDER — CHLORHEXIDINE GLUCONATE 0.12 % MT SOLN
OROMUCOSAL | Status: AC
  Filled 2021-07-24: qty 15

## 2021-07-24 MED ORDER — ACETAMINOPHEN 500 MG PO TABS
1000.0000 mg | ORAL_TABLET | Freq: Four times a day (QID) | ORAL | Status: AC
  Administered 2021-07-25 (×2): 1000 mg via ORAL
  Filled 2021-07-24 (×3): qty 2

## 2021-07-24 MED ORDER — HYDROMORPHONE HCL 1 MG/ML IJ SOLN
0.5000 mg | INTRAMUSCULAR | Status: DC | PRN
  Administered 2021-07-25: 1 mg via INTRAVENOUS
  Filled 2021-07-24 (×2): qty 1

## 2021-07-24 MED ORDER — OXYCODONE HCL 5 MG PO TABS
10.0000 mg | ORAL_TABLET | ORAL | Status: DC | PRN
  Filled 2021-07-24: qty 2

## 2021-07-24 MED ORDER — SODIUM CHLORIDE 0.9 % IV SOLN: INTRAVENOUS | Status: DC

## 2021-07-24 MED ORDER — ONDANSETRON HCL 4 MG PO TABS: 4.0000 mg | ORAL_TABLET | Freq: Four times a day (QID) | ORAL | Status: DC | PRN

## 2021-07-24 MED ORDER — LIDOCAINE 2% (20 MG/ML) 5 ML SYRINGE
INTRAMUSCULAR | Status: AC
  Filled 2021-07-24: qty 5

## 2021-07-24 MED ORDER — ORAL CARE MOUTH RINSE: 15.0000 mL | Freq: Once | OROMUCOSAL | Status: DC

## 2021-07-24 MED ORDER — METHOCARBAMOL 1000 MG/10ML IJ SOLN
500.0000 mg | Freq: Four times a day (QID) | INTRAVENOUS | Status: DC | PRN
  Filled 2021-07-24: qty 5

## 2021-07-24 MED ORDER — FENTANYL CITRATE (PF) 100 MCG/2ML IJ SOLN
INTRAMUSCULAR | Status: DC | PRN
  Administered 2021-07-24 (×5): 50 ug via INTRAVENOUS

## 2021-07-24 MED ORDER — ONDANSETRON HCL 4 MG/2ML IJ SOLN
INTRAMUSCULAR | Status: AC
  Filled 2021-07-24: qty 2

## 2021-07-24 MED ORDER — POVIDONE-IODINE 10 % EX SWAB: 2.0000 "application " | Freq: Once | CUTANEOUS | Status: DC

## 2021-07-24 MED ORDER — DOCUSATE SODIUM 100 MG PO CAPS
100.0000 mg | ORAL_CAPSULE | Freq: Two times a day (BID) | ORAL | Status: DC
  Administered 2021-07-25 – 2021-07-26 (×3): 100 mg via ORAL
  Filled 2021-07-24 (×4): qty 1

## 2021-07-24 MED ORDER — TRANEXAMIC ACID-NACL 1000-0.7 MG/100ML-% IV SOLN
1000.0000 mg | Freq: Once | INTRAVENOUS | Status: AC
  Administered 2021-07-24: 1000 mg via INTRAVENOUS
  Filled 2021-07-24: qty 100

## 2021-07-24 MED ORDER — CEFAZOLIN SODIUM-DEXTROSE 2-4 GM/100ML-% IV SOLN
2.0000 g | Freq: Four times a day (QID) | INTRAVENOUS | Status: AC
  Administered 2021-07-24 – 2021-07-25 (×3): 2 g via INTRAVENOUS
  Filled 2021-07-24 (×3): qty 100

## 2021-07-24 MED ORDER — TRANEXAMIC ACID-NACL 1000-0.7 MG/100ML-% IV SOLN
INTRAVENOUS | Status: AC
  Filled 2021-07-24: qty 100

## 2021-07-24 SURGICAL SUPPLY — 31 items
BAG COUNTER SPONGE SURGICOUNT (BAG) ×2 IMPLANT
BAG SPNG CNTER NS LX DISP (BAG) ×1
BIT DRILL CANNULATED 5.0 (BIT) ×1 IMPLANT
BNDG COHESIVE 4X5 TAN STRL (GAUZE/BANDAGES/DRESSINGS) ×2 IMPLANT
COVER PERINEAL POST (MISCELLANEOUS) ×2 IMPLANT
DRAPE STERI IOBAN 125X83 (DRAPES) ×2 IMPLANT
DRESSING MEPILEX FLEX 4X4 (GAUZE/BANDAGES/DRESSINGS) IMPLANT
DRSG MEPILEX FLEX 4X4 (GAUZE/BANDAGES/DRESSINGS) ×2
DURAPREP 26ML APPLICATOR (WOUND CARE) ×2 IMPLANT
ELECT CAUTERY BLADE 6.4 (BLADE) ×2 IMPLANT
GLOVE SURG LTX SZ7 (GLOVE) ×2 IMPLANT
GLOVE SURG UNDER POLY LF SZ7.5 (GLOVE) ×8 IMPLANT
GOWN STRL REIN XL XLG (GOWN DISPOSABLE) ×6 IMPLANT
GUIDEWIRE THREAD TIP 3.2X9 (WIRE) ×1 IMPLANT
KIT BASIN OR (CUSTOM PROCEDURE TRAY) ×2 IMPLANT
KIT TURNOVER KIT B (KITS) ×2 IMPLANT
NS IRRIG 1000ML POUR BTL (IV SOLUTION) ×2 IMPLANT
PACK GENERAL/GYN (CUSTOM PROCEDURE TRAY) ×2 IMPLANT
PAD ARMBOARD 7.5X6 YLW CONV (MISCELLANEOUS) ×4 IMPLANT
PIN THD TIP GUIDE 3.2X12 (PIN) ×1 IMPLANT
SCREW CANN 7.0X85MM (Screw) ×4 IMPLANT
SCREW CANN FT SD 7X90 (Screw) ×1 IMPLANT
SCREW CANN RVRS CUT FLT 85X16X (Screw) IMPLANT
SPONGE T-LAP 18X18 ~~LOC~~+RFID (SPONGE) ×1 IMPLANT
STAPLER VISISTAT 35W (STAPLE) ×1 IMPLANT
SUT VIC AB 0 CT1 27 (SUTURE) ×2
SUT VIC AB 0 CT1 27XBRD ANBCTR (SUTURE) ×1 IMPLANT
SUT VIC AB 2-0 CT1 27 (SUTURE) ×2
SUT VIC AB 2-0 CT1 TAPERPNT 27 (SUTURE) ×1 IMPLANT
TOWEL GREEN STERILE (TOWEL DISPOSABLE) ×2 IMPLANT
TOWEL GREEN STERILE FF (TOWEL DISPOSABLE) ×2 IMPLANT

## 2021-07-24 NOTE — Transfer of Care (Signed)
Immediate Anesthesia Transfer of Care Note  Patient: Ariana [name]  Procedure(s) Performed: CANNULATED HIP PINNING (Right: Hip)  Patient Location: PACU  Anesthesia Type:General  Level of Consciousness: awake, alert  and pateint uncooperative  Airway & Oxygen Therapy: Patient Spontanous Breathing and Patient connected to face mask oxygen  Post-op Assessment: Report given to RN, Post -op Vital signs reviewed and stable and Patient moving all extremities X 4  Post vital signs: Reviewed and stable  Last Vitals:  Vitals Value Taken Time  BP 151/98 07/24/21 1612  Temp    Pulse 89 07/24/21 1619  Resp 11 07/24/21 1619  SpO2 89 % 07/24/21 1619  Vitals shown include unvalidated device data.  Last Pain:  Vitals:   07/24/21 1343  TempSrc: Oral  PainSc:          Complications: No notable events documented.

## 2021-07-24 NOTE — Progress Notes (Signed)
PROGRESS NOTE  Ariana [name] PXT:[id number] DOB: 15-Dec-1932   PCP: Caprice Renshaw, MD  Patient is from: Nursing home.  DOA: 07/21/2021 LOS: 3  Chief complaints:  Chief Complaint  Patient presents with   Fall    Pt is [place] and pt fell, found in floor with hematoma to back of head.  See and treated at [facility] last month for fall with brain bleed.  EMS reports that pt will not straight right leg, no rotation noted.  Pt is confused and her normal baseline.  VSS.         Brief Narrative / Interim history: 87 year old F with PMH of ITP, asthma/COPD, Alzheimer's dementia, SAH, hypothyroidism, HTN, HLD and GERD brought to [facility] ED by EMS with right hip pain and head pain after she sustained a fall, and found to have impacted subcapital right femoral neck fracture.  She was transferred to [facility] for surgical intervention.  CT head showed slightly decreased size of anterior left frontal intraparenchymal hematoma.  Oncology/hematology consulted about thrombocytopenia and recommended IVIG and Nplate that she received on 10/10 and 10/11 respectively.  Plan for surgical repair by orthopedic surgery.   Subjective: Seen and examined earlier this morning.  No major events overnight of this morning.  No complaints but not a reliable historian.  She is only oriented to self.  She responds no to pain or shortness of breath.  Objective: Vitals:   07/23/21 2329 07/24/21 0352 07/24/21 0839 07/24/21 1114  BP: (!) 141/82 130/78 (!) 170/75 137/69  Pulse: 82 79 83 89  Resp: 19 18 17 14   Temp:  98.2 F (36.8 C) 98.2 F (36.8 C) 98 F (36.7 C)  TempSrc:  Oral Oral Axillary  SpO2: 92% 95% 97% 91%  Weight:      Height:        Intake/Output Summary (Last 24 hours) at 07/24/2021 1123 Last data filed at 07/24/2021 0839 Gross per 24 hour  Intake 783 ml  Output 600 ml  Net 183 ml   Filed Weights   07/21/21 1502  Weight: 60 kg    Examination:  GENERAL: Frail looking elderly female. HEENT:  MMM.  Vision and hearing grossly intact.  NECK: Supple.  No apparent JVD.  RESP: 95% on RA.  No IWOB.  Fair aeration bilaterally. CVS:  RRR. Heart sounds normal.  ABD/GI/GU: BS+. Abd soft, NTND.  MSK/EXT:  Moves extremities.  Significant muscle mass and subcu fat loss. SKIN: no apparent skin lesion or wound NEURO: Awake and alert. Oriented only to self.  No apparent focal neuro deficit. PSYCH: Calm. Normal affect.   Procedures:  None  Microbiology summarized: [id number] and influenza PCR nonreactive.  Assessment & Plan: Fall at nursing facility Impacted subcapital right femoral neck fracture likely due to fall -Plan for surgical repair this afternoon. -Pain control-with as needed fentanyl and scheduled Tylenol -PT/OT after surgical repair -Fall precaution   Thrombocytopenia/ITP-s/p IVIG and Nplate on 09/38/1829.  Improving. Recent Labs  Lab 07/21/21 1851 07/22/21 0407 07/23/21 0356 07/23/21 0803 07/24/21 0127  PLT 12* 27* PLATELET CLUMPS NOTED ON SMEAR, UNABLE TO ESTIMATE 67* 68*  -Continue monitoring   History of subarachnoid hemorrhage-improved on CT head -No indication for NS intervention per Dr. Christella Noa with NS  CKD-3B: Ruled out.  Leukocytosis/bandemia: Likely demargination.  Resolving. -Continue monitoring  Hypokalemia: Resolved  Hyperglycemia: Resolved.  No history of DM.   Prolonged QTc (511 ms)-exaggerated by wide QRS due to LBBB.  Hypothyroidism -Continue Synthroid   Essential hypertension: Normotensive. -  Continue amlodipine  History of COPD/asthma: Stable -Continue home inhalers  Hyponatremia: Na 134.  Likely hypovolemic.  She is also on Celexa which could contribute -Continue IV NS  GERD -Continue Protonix  Alzheimer's dementia without behavioral disturbance -Continue Namenda -Reorientation and delirium precautions  Anxiety: Stable. -Continue home Lexapro, Lamictal, gabapentin, BuSpar and Xanax  Severe protein calorie malnutrition:  Significant muscle mass and subcu fat loss. Body mass index is 20.72 kg/m. Nutrition Problem: Severe Malnutrition Etiology: chronic illness (Alzheimer's disease, COPD)-Consult dietitian Signs/Symptoms: severe muscle depletion, severe fat depletion Interventions: Ensure Enlive (each supplement provides 350kcal and 20 grams of protein), MVI, Magic cup, Refer to RD note for recommendations   DVT prophylaxis:  SCDs Start: 07/21/21 2316  Code Status: DNR/DNI Family Communication: Patient and/or RN. Available if any question.  Level of care: Progressive Status is: Inpatient  Remains inpatient appropriate because:Ongoing diagnostic testing needed not appropriate for outpatient work up, Unsafe d/c plan, and Inpatient level of care appropriate due to severity of illness  Dispo: The patient is from:  Nursing home              Anticipated d/c is to:  Nursing home              Patient currently is not medically stable to d/c.   Difficult to place patient No       Consultants:  Orthopedic surgery Oncology/hematology   Sch Meds:  Scheduled Meds:  sodium chloride   Intravenous Once   acetaminophen  650 mg Oral Q6H WA   amLODipine  10 mg Oral Daily   feeding supplement  237 mL Oral BID BM   levothyroxine  50 mcg Oral QAC breakfast   memantine  10 mg Oral BID   multivitamin with minerals  1 tablet Oral Daily   pantoprazole (PROTONIX) IV  40 mg Intravenous Q24H   rosuvastatin  5 mg Oral QPM   Continuous Infusions:  sodium chloride Stopped (07/22/21 1502)   PRN Meds:.sodium chloride, ALPRAZolam, fentaNYL (SUBLIMAZE) injection  Antimicrobials: Anti-infectives (From admission, onward)    None        I have personally reviewed the following labs and images: CBC: Recent Labs  Lab 07/21/21 1851 07/22/21 0407 07/23/21 0356 07/23/21 0803 07/24/21 0127  WBC 18.8* 14.0* 15.6*  --  11.7*  NEUTROABS 15.0*  --   --   --   --   HGB 13.0 11.6* 12.0  --  12.2  HCT 38.7 35.3*  35.9*  --  36.2  MCV 92.8 93.6 90.7  --  90.0  PLT 12* 27* PLATELET CLUMPS NOTED ON SMEAR, UNABLE TO ESTIMATE 67* 68*   BMP &GFR Recent Labs  Lab 07/21/21 1851 07/22/21 0407 07/23/21 0356 07/24/21 0318  NA 139 136 132* 134*  K 3.0* 3.8 4.3 3.6  CL 103 104 102 104  CO2 28 27 23  21*  GLUCOSE 136* 110* 93 85  BUN 16 14 12 15   CREATININE 0.88 0.67 0.61 0.68  CALCIUM 8.5* 8.5* 8.6* 8.7*  MG  --  1.8  --  2.0  PHOS  --  2.7  --  3.4   Estimated Creatinine Clearance: 48.7 mL/min (by C-G formula based on SCr of 0.68 mg/dL). Liver & Pancreas: Recent Labs  Lab 07/21/21 1851 07/22/21 0407 07/24/21 0318  AST 25 20  --   ALT 24 19  --   ALKPHOS 106 89  --   BILITOT 0.7 0.8  --   PROT 7.0 8.1  --  ALBUMIN 3.7 3.3* 2.8*   No results for input(s): LIPASE, AMYLASE in the last 168 hours. No results for input(s): AMMONIA in the last 168 hours. Diabetic: No results for input(s): HGBA1C in the last 72 hours. No results for input(s): GLUCAP in the last 168 hours. Cardiac Enzymes: Recent Labs  Lab 07/24/21 0318  CKTOTAL 28*   No results for input(s): PROBNP in the last 8760 hours. Coagulation Profile: Recent Labs  Lab 07/22/21 0407  INR 1.0   Thyroid Function Tests: No results for input(s): TSH, T4TOTAL, FREET4, T3FREE, THYROIDAB in the last 72 hours. Lipid Profile: No results for input(s): CHOL, HDL, LDLCALC, TRIG, CHOLHDL, LDLDIRECT in the last 72 hours. Anemia Panel: No results for input(s): VITAMINB12, FOLATE, FERRITIN, TIBC, IRON, RETICCTPCT in the last 72 hours. Urine analysis:    Component Value Date/Time   COLORURINE AMBER (A) 07/13/2021 1011   APPEARANCEUR CLOUDY (A) 07/13/2021 1011   APPEARANCEUR Clear 09/03/2014 1200   LABSPEC 1.025 07/13/2021 1011   LABSPEC 1.018 09/03/2014 1200   PHURINE 5.0 07/13/2021 1011   GLUCOSEU NEGATIVE 07/13/2021 1011   GLUCOSEU Negative 09/03/2014 1200   HGBUR SMALL (A) 07/13/2021 1011   BILIRUBINUR NEGATIVE 07/13/2021 1011    BILIRUBINUR Negative 09/03/2014 1200   KETONESUR 5 (A) 07/13/2021 1011   PROTEINUR 30 (A) 07/13/2021 1011   NITRITE NEGATIVE 07/13/2021 1011   LEUKOCYTESUR SMALL (A) 07/13/2021 1011   LEUKOCYTESUR Negative 09/03/2014 1200   Sepsis Labs: Invalid input(s): PROCALCITONIN, [place]  Microbiology: Recent Results (from the past 240 hour(s))  Resp Panel by RT-PCR (Flu A&B, Covid) Nasopharyngeal Swab     Status: None   Collection Time: 07/21/21  7:54 PM   Specimen: Nasopharyngeal Swab; Nasopharyngeal(NP) swabs in vial transport medium  Result Value Ref Range Status   SARS Coronavirus 2 by RT PCR NEGATIVE NEGATIVE Final    Comment: (NOTE) SARS-CoV-2 target nucleic acids are NOT DETECTED.  The SARS-CoV-2 RNA is generally detectable in upper respiratory specimens during the acute phase of infection. The lowest concentration of SARS-CoV-2 viral copies this assay can detect is 138 copies/mL. A negative result does not preclude SARS-Cov-2 infection and should not be used as the sole basis for treatment or other patient management decisions. A negative result may occur with  improper specimen collection/handling, submission of specimen other than nasopharyngeal swab, presence of viral mutation(s) within the areas targeted by this assay, and inadequate number of viral copies(<138 copies/mL). A negative result must be combined with clinical observations, patient history, and epidemiological information. The expected result is Negative.  Fact Sheet for Patients:  EntrepreneurPulse.com.au  Fact Sheet for Healthcare Providers:  IncredibleEmployment.be  This test is no t yet approved or cleared by the [place] FDA and  has been authorized for detection and/or diagnosis of SARS-CoV-2 by FDA under an Emergency Use Authorization (EUA). This EUA will remain  in effect (meaning this test can be used) for the duration of the COVID-19 declaration under  Section 564(b)(1) of the Act, 21 U.S.C.section 360bbb-3(b)(1), unless the authorization is terminated  or revoked sooner.       Influenza A by PCR NEGATIVE NEGATIVE Final   Influenza B by PCR NEGATIVE NEGATIVE Final    Comment: (NOTE) The Xpert Xpress SARS-CoV-2/FLU/RSV plus assay is intended as an aid in the diagnosis of influenza from Nasopharyngeal swab specimens and should not be used as a sole basis for treatment. Nasal washings and aspirates are unacceptable for Xpert Xpress SARS-CoV-2/FLU/RSV testing.  Fact Sheet for Patients: EntrepreneurPulse.com.au  Fact Sheet for Healthcare Providers: IncredibleEmployment.be  This test is not yet approved or cleared by the [place] FDA and has been authorized for detection and/or diagnosis of SARS-CoV-2 by FDA under an Emergency Use Authorization (EUA). This EUA will remain in effect (meaning this test can be used) for the duration of the COVID-19 declaration under Section 564(b)(1) of the Act, 21 U.S.C. section 360bbb-3(b)(1), unless the authorization is terminated or revoked.  Performed at [facility], [address] [place], [place] [address]     Radiology Studies: No results found.    Nedim Oki T. [place]  If 7PM-7AM, please contact night-coverage [email or web address] 07/24/2021, 11:23 AM

## 2021-07-24 NOTE — Anesthesia Preprocedure Evaluation (Addendum)
Anesthesia Evaluation  Patient identified by MRN, date of birth, ID band Patient confused    Reviewed: Allergy & Precautions, NPO status , Patient's Chart, lab work & pertinent test results  Airway Mallampati: III  TM Distance: >3 FB Neck ROM: Full    Dental  (+) Dental Advisory Given   Pulmonary asthma , COPD,    breath sounds clear to auscultation       Cardiovascular hypertension,  Rhythm:Regular  1. Left ventricular ejection fraction, by visual estimation, is 55 to  60%. The left ventricle has normal function. Normal left ventricular size.  There is mildly increased left ventricular hypertrophy.  2. Left ventricular diastolic Doppler parameters are consistent with  pseudonormalization pattern of LV diastolic filling.  3. Global right ventricle has normal systolic function.The right  ventricular size is mildly enlarged. No increase in right ventricular wall  thickness.  4. Left atrial size was mildly dilated.  5. Right atrial size was normal.  6. The pericardial effusion is posterior to the left ventricle.  7. Trivial pericardial effusion is present.  8. Mild aortic valve annular calcification.  9. Moderate mitral annular calcification.  10. The mitral valve is grossly normal. Moderate mitral valve  regurgitation.  11. The tricuspid valve is grossly normal. Tricuspid valve regurgitation  is mild.  12. The aortic valve is tricuspid Aortic valve regurgitation was not  visualized by color flow Doppler. Mild aortic valve sclerosis without  stenosis.  13. The pulmonic valve was normal in structure. Pulmonic valve  regurgitation is trivial by color flow Doppler.  14. Normal pulmonary artery systolic pressure.  15. The inferior vena cava is normal in size with <50% respiratory  variability, suggesting right atrial pressure of 8 mmHg.  16. The interatrial septum was not well visualized.    Neuro/Psych PSYCHIATRIC  DISORDERS Anxiety Depression Dementia  Neuromuscular disease    GI/Hepatic Neg liver ROS, hiatal hernia, GERD  ,  Endo/Other  Hypothyroidism No results found for: HGBA1C   Renal/GU Lab Results      Component                Value               Date                      CREATININE               0.68                07/24/2021           Lab Results      Component                Value               Date                      K                        3.6                 07/24/2021                Musculoskeletal  (+) Arthritis , right hip fracture   Abdominal   Peds  Hematology  (+) Blood dyscrasia, , Lab Results      Component  Value               Date                      WBC                      11.7 (H)            07/24/2021                HGB                      12.2                07/24/2021                HCT                      36.2                07/24/2021                MCV                      90.0                07/24/2021                PLT                      68 (L)              07/24/2021               Anesthesia Other Findings   Reproductive/Obstetrics                           Anesthesia Physical Anesthesia Plan  ASA: 3  Anesthesia Plan: General   Post-op Pain Management:    Induction: Intravenous  PONV Risk Score and Plan: 3 and Ondansetron, Dexamethasone, Propofol infusion and TIVA  Airway Management Planned: Oral ETT  Additional Equipment: None  Intra-op Plan:   Post-operative Plan: Extubation in OR  Informed Consent: I have reviewed the patients History and Physical, chart, labs and discussed the procedure including the risks, benefits and alternatives for the proposed anesthesia with the patient or authorized representative who has indicated his/her understanding and acceptance.     Dental advisory given and Consent reviewed with POA  Plan Discussed with: CRNA and Anesthesiologist  Anesthesia  Plan Comments:        Anesthesia Quick Evaluation

## 2021-07-24 NOTE — Discharge Instructions (Signed)
    1. Change dressings as needed 2. May shower but keep incisions covered and dry 3. Take stool softeners as needed 4. Take pain meds as needed

## 2021-07-24 NOTE — Op Note (Signed)
   Date of Surgery: 07/24/2021  INDICATIONS: Ms. Ask is a 87 y.o.-year-old female who sustained a hip fracture; she was indicated for open reduction and internal fixation due to the displaced nature of the fracture and came to the operating room today for this procedure. The  guardian  did consent to the procedure after discussion of the risks and benefits.   PREOPERATIVE DIAGNOSIS: right valgus impacted femoral neck fracture  POSTOPERATIVE DIAGNOSIS: Same.  PROCEDURE: Open treatment of proximal end of femur, neck with internal fixation. CPT [phone].  SURGEON: N. Eduard Roux, M.D.  ASSIST: Ciro Backer [place], [place]; necessary for the timely completion of procedure and due to complexity of procedure.  ANESTHESIA: general  IV FLUIDS AND URINE: See anesthesia.  ESTIMATED BLOOD LOSS: minimal mL.  IMPLANTS: Zimmer 7.0 cannulated screws  DRAINS: None.  COMPLICATIONS: see description of procedure.  DESCRIPTION OF PROCEDURE: The patient was brought to the operating room and placed supine on the operating table.  The patient had been signed prior to the procedure and this was documented. The patient had the anesthesia placed by the anesthesiologist.  The prep verification and incision time-outs were performed to confirm that this was the correct patient, site, side and location. The patient had SCDs in place. The patient did receive antibiotics prior to the incision and was redosed during the procedure as needed at indicated intervals.  The patient's well leg was placed in a scissored position and carefully padded.  The operative leg was placed in traction and also well-padded.  Care was taken to confirm radiographs before prepping and draping. The hip was prepped and draped in the standard fashion.  An incision was first made over the lateral thigh.  Screws were placed using the following technique.  The Kirschner wire was first placed and confirmed to be in the proper location on both AP and  lateral views. I placed 3 wires in an inverted triangle pattern.  After the guidewires were placed, the cannulated drill was started over the wire.  The drill was used to drill the bony corridor, again confirming during drilling on both views. The screws were then advanced and again confirmed in position on both views.  The measuring stick was used to measure the length of screws.  This was repeated for all screws.  The approach-withdraw phenomenon was visualized under live fluoroscopy to confirm that all screws were of the appropriate length and in the head.   Of note, during the orthopedic procedure, the bone quality was found to be very poor. All wounds were copiously irrigated with saline and then the skin was reapproximated with staples. The wounds were cleaned and dried a final time and a sterile dressing. The patient was then transferred back to the bed and left the operating room in stable condition.  All sponge and instrument counts were correct.  POSTOPERATIVE PLAN: Ms. Vanvorst will be weight bearing as tolerated; she will return for staple removal in 2 weeks. We will defer DVT prophylaxis to the primary team due to ITP and thrombocytopenia.

## 2021-07-24 NOTE — Progress Notes (Signed)
PT Cancellation Note  Patient Details Name: Ariana [name] MRN: [id number] DOB: May 19, 1933   Cancelled Treatment:    Reason Eval/Treat Not Completed: Patient not medically ready Plan for surgery this pm. Will assess after surgery as appropriate.  Michiel Sivley A. Gilford Rile PT, DPT Acute Rehabilitation Services Pager [phone] Office [phone]    Linna Hoff 07/24/2021, 8:23 AM

## 2021-07-24 NOTE — Anesthesia Procedure Notes (Signed)
Procedure Name: Intubation Date/Time: 07/24/2021 3:13 PM Performed by: Jonna Munro, CRNA Pre-anesthesia Checklist: Patient identified, Emergency Drugs available, Suction available, Patient being monitored and Timeout performed Patient Re-evaluated:Patient Re-evaluated prior to induction Oxygen Delivery Method: Circle system utilized Preoxygenation: Pre-oxygenation with 100% oxygen Induction Type: IV induction Ventilation: Mask ventilation without difficulty Laryngoscope Size: Mac and 3 Grade View: Grade I Tube type: Oral Tube size: 7.0 mm Number of attempts: 1 Airway Equipment and Method: Stylet Placement Confirmation: ETT inserted through vocal cords under direct vision, positive ETCO2 and breath sounds checked- equal and bilateral Secured at: 22 cm Tube secured with: Tape Dental Injury: Teeth and Oropharynx as per pre-operative assessment

## 2021-07-24 NOTE — Progress Notes (Signed)
OT Cancellation Note  Patient Details Name: Ariana [name] MRN: [id number] DOB: 1933-05-20   Cancelled Treatment:    Reason Eval/Treat Not Completed: Medical issues which prohibited therapy (Plan for surgery this pm. Will assess after surgery as appropriate. thanks)  [place], OT/L   Acute OT Clinical Specialist Acute Rehabilitation Services Pager [phone] Office [phone]  07/24/2021, 8:06 AM

## 2021-07-25 ENCOUNTER — Encounter (HOSPITAL_COMMUNITY): Payer: Self-pay | Admitting: Orthopaedic Surgery

## 2021-07-25 DIAGNOSIS — D693 Immune thrombocytopenic purpura: Secondary | ICD-10-CM | POA: Diagnosis not present

## 2021-07-25 DIAGNOSIS — E039 Hypothyroidism, unspecified: Secondary | ICD-10-CM | POA: Diagnosis not present

## 2021-07-25 DIAGNOSIS — W19XXXA Unspecified fall, initial encounter: Secondary | ICD-10-CM | POA: Diagnosis not present

## 2021-07-25 DIAGNOSIS — S72001A Fracture of unspecified part of neck of right femur, initial encounter for closed fracture: Secondary | ICD-10-CM | POA: Diagnosis not present

## 2021-07-25 LAB — CBC
HCT: 35.1 % — ABNORMAL LOW (ref 36.0–46.0)
Hemoglobin: 11.8 g/dL — ABNORMAL LOW (ref 12.0–15.0)
MCH: 30.1 pg (ref 26.0–34.0)
MCHC: 33.6 g/dL (ref 30.0–36.0)
MCV: 89.5 fL (ref 80.0–100.0)
Platelets: 69 10*3/uL — ABNORMAL LOW (ref 150–400)
RBC: 3.92 MIL/uL (ref 3.87–5.11)
RDW: 16.9 % — ABNORMAL HIGH (ref 11.5–15.5)
WBC: 15 10*3/uL — ABNORMAL HIGH (ref 4.0–10.5)
nRBC: 0 % (ref 0.0–0.2)

## 2021-07-25 LAB — RENAL FUNCTION PANEL
Albumin: 2.7 g/dL — ABNORMAL LOW (ref 3.5–5.0)
Anion gap: 8 (ref 5–15)
BUN: 19 mg/dL (ref 8–23)
CO2: 23 mmol/L (ref 22–32)
Calcium: 8.5 mg/dL — ABNORMAL LOW (ref 8.9–10.3)
Chloride: 104 mmol/L (ref 98–111)
Creatinine, Ser: 0.74 mg/dL (ref 0.44–1.00)
GFR, Estimated: >60 mL/min (ref >60)
Glucose, Bld: 81 mg/dL (ref 70–99)
Phosphorus: 3.6 mg/dL (ref 2.5–4.6)
Potassium: 3.6 mmol/L (ref 3.5–5.1)
Sodium: 135 mmol/L (ref 135–145)

## 2021-07-25 LAB — MAGNESIUM: Magnesium: 1.8 mg/dL (ref 1.7–2.4)

## 2021-07-25 MED ORDER — MAGNESIUM SULFATE 2 GM/50ML IV SOLN
2.0000 g | Freq: Once | INTRAVENOUS | Status: AC
  Administered 2021-07-25: 2 g via INTRAVENOUS
  Filled 2021-07-25: qty 50

## 2021-07-25 MED ORDER — PANTOPRAZOLE SODIUM 40 MG PO TBEC
40.0000 mg | DELAYED_RELEASE_TABLET | Freq: Every day | ORAL | Status: DC
  Administered 2021-07-26 – 2021-07-30 (×5): 40 mg via ORAL
  Filled 2021-07-25 (×5): qty 1

## 2021-07-25 MED ORDER — POTASSIUM CHLORIDE 20 MEQ PO PACK
40.0000 meq | PACK | Freq: Once | ORAL | Status: AC
  Administered 2021-07-25: 40 meq via ORAL
  Filled 2021-07-25: qty 2

## 2021-07-25 MED ORDER — ORAL CARE MOUTH RINSE
15.0000 mL | Freq: Two times a day (BID) | OROMUCOSAL | Status: DC
  Administered 2021-07-26 – 2021-07-29 (×6): 15 mL via OROMUCOSAL

## 2021-07-25 MED ORDER — CHLORHEXIDINE GLUCONATE CLOTH 2 % EX PADS
6.0000 | MEDICATED_PAD | Freq: Every day | CUTANEOUS | Status: DC
  Administered 2021-07-25 – 2021-07-29 (×5): 6 via TOPICAL

## 2021-07-25 NOTE — Evaluation (Signed)
Physical Therapy Evaluation Patient Details Name: Ariana [name] MRN: [id number] DOB: 03-20-1933 Today's Date: 07/25/2021  History of Present Illness  87 y.o. female admitted 10/10 due to fall at SNF - injury to the back of the head and R femoral neck fracture. Underwent ORIF 10/13.  CT head without contrast showed slight decreased size of the anterior left frontal intraparenchymal hematoma since prior study. PMH includes  ITP, asthma/COPD, Alzheimer disease with behavioral disturbances, gastroesophageal reflux disease, hypertension, hypercholesterolemia, hypothyroidism, chronic kidney disease stage IIIb Of note: Patient was recently admitted on 10/2-10/4 at [facility] due to subarachnoid hemorrhage sustained due to mechanical fall, neurosurgery was consulted at that time and patient did not require any intervention.  Clinical Impression  Patient from SNF and poor historian due to hx of dementia. Patient presents with weakness, impaired balance, impaired cognition, decreased activity tolerance, and impaired functional mobility. Patient required max-totalA+2 for bed mobility. Deferred standing attempt this date due to patient reporting "wooziness" and not attempting to stand with commands. Patient will benefit from skilled PT services during acute stay to address listed deficits. Recommend return to SNF following discharge with therapies to maximize functional mobility and safety.      Recommendations for follow up therapy are one component of a multi-disciplinary discharge planning process, led by the attending physician.  Recommendations may be updated based on patient status, additional functional criteria and insurance authorization.  Follow Up Recommendations SNF (return to SNF)    Equipment Recommendations  Other (comment) (defer to next venue of care)    Recommendations for Other Services       Precautions / Restrictions Precautions Precautions: Fall Restrictions Weight Bearing  Restrictions: Yes RLE Weight Bearing: Weight bearing as tolerated LLE Weight Bearing: Weight bearing as tolerated      Mobility  Bed Mobility Overal bed mobility: Needs Assistance Bed Mobility: Supine to Sit;Sit to Supine     Supine to sit: Max assist;+2 for physical assistance;+2 for safety/equipment;HOB elevated Sit to supine: Total assist;+2 for physical assistance;+2 for safety/equipment;HOB elevated   General bed mobility comments: maxA+2 to move towards EOB, patient assist with trunk elevation. TotalA+2 for return to supine    Transfers                 General transfer comment: deferred due to patient reporting "wooziness".  Ambulation/Gait                Stairs            Wheelchair Mobility    Modified Rankin (Stroke Patients Only)       Balance Overall balance assessment: Needs assistance;History of Falls Sitting-balance support: No upper extremity supported;Feet supported Sitting balance-Leahy Scale: Fair                                       Pertinent Vitals/Pain Pain Assessment: Faces Faces Pain Scale: Hurts little more Pain Location: unsure, grimacing. Reports no pain when asked Pain Descriptors / Indicators: Grimacing Pain Intervention(s): Monitored during session    Home Living Family/patient expects to be discharged to:: Skilled nursing facility                      Prior Function Level of Independence: Needs assistance   Gait / Transfers Assistance Needed: patient states use of RW but unreliable historian due to cognitive deficits     Comments: Poor historian due to  hx of dementia     Hand Dominance        Extremity/Trunk Assessment   Upper Extremity Assessment Upper Extremity Assessment: Defer to OT evaluation    Lower Extremity Assessment Lower Extremity Assessment: Difficult to assess due to impaired cognition;Generalized weakness    Cervical / Trunk Assessment Cervical / Trunk  Assessment: Kyphotic  Communication   Communication: Expressive difficulties (at times patient mumbles with incomprehensible speech with times of clear speech)  Cognition Arousal/Alertness: Awake/alert Behavior During Therapy: WFL for tasks assessed/performed Overall Cognitive Status: History of cognitive impairments - at baseline                                 General Comments: hx of dementia at baseline. Following most commands appropriately.      General Comments      Exercises     Assessment/Plan    PT Assessment Patient needs continued PT services  PT Problem List Decreased strength;Decreased activity tolerance;Decreased balance;Decreased mobility;Decreased range of motion;Decreased cognition;Decreased knowledge of use of DME;Decreased safety awareness;Decreased knowledge of precautions       PT Treatment Interventions DME instruction;Gait training;Functional mobility training;Therapeutic activities;Therapeutic exercise;Balance training;Patient/family education    PT Goals (Current goals can be found in the Care Plan section)  Acute Rehab PT Goals Patient Stated Goal: did not state PT Goal Formulation: Patient unable to participate in goal setting Time For Goal Achievement: 08/08/21 Potential to Achieve Goals: Fair    Frequency Min 3X/week   Barriers to discharge        Co-evaluation PT/OT/SLP Co-Evaluation/Treatment: Yes Reason for Co-Treatment: For patient/therapist safety;To address functional/ADL transfers;Necessary to address cognition/behavior during functional activity PT goals addressed during session: Mobility/safety with mobility         AM-PAC PT "6 Clicks" Mobility  Outcome Measure Help needed turning from your back to your side while in a flat bed without using bedrails?: Total Help needed moving from lying on your back to sitting on the side of a flat bed without using bedrails?: Total Help needed moving to and from a bed to a chair  (including a wheelchair)?: Total Help needed standing up from a chair using your arms (e.g., wheelchair or bedside chair)?: Total Help needed to walk in hospital room?: Total Help needed climbing 3-5 steps with a railing? : Total 6 Click Score: 6    End of Session   Activity Tolerance: Patient tolerated treatment well Patient left: in bed;with call bell/phone within reach;with bed alarm set Nurse Communication: Mobility status PT Visit Diagnosis: History of falling (Z91.81);Muscle weakness (generalized) (M62.81);Difficulty in walking, not elsewhere classified (R26.2)    Time: 7062-3762 PT Time Calculation (min) (ACUTE ONLY): 25 min   Charges:   PT Evaluation $PT Eval Moderate Complexity: 1 Mod PT Treatments $Therapeutic Activity: 8-22 mins        Alexias Margerum A. Gilford Rile PT, DPT Acute Rehabilitation Services Pager [phone] Office [phone]   Linna Hoff 07/25/2021, 11:54 AM

## 2021-07-25 NOTE — Progress Notes (Addendum)
Occupational Therapy Evaluation Patient Details Name: Ariana [name] MRN: [id number] DOB: 05-Apr-1933 Today's Date: 07/25/2021   History of Present Illness 87 y.o. female admitted 10/10 due to fall at SNF - injury to the back of the head and R femoral neck fracture. Underwent ORIF 10/13.  CT head without contrast showed slight decreased size of the anterior left frontal intraparenchymal hematoma since prior study. PMH includes  ITP, asthma/COPD, Alzheimer disease with behavioral disturbances, gastroesophageal reflux disease, hypertension, hypercholesterolemia, hypothyroidism, chronic kidney disease stage IIIb Of note: Patient was recently admitted on 10/2-10/4 at [facility] due to subarachnoid hemorrhage sustained due to mechanical fall, neurosurgery was consulted at that time and patient did not require any intervention.   Clinical Impression   Pt living at SNF PTA and apparently walking with a RW and most likely had assistance with ADL tasks due to cognitive deficits.  Able to mobilize to EOB with Max A +2. Attempted to stand however pt with increased complains of pain and complaining of feeling "woozy". Pt thanking therapists for their help. Recommend rehab at SNF to return to her functional baseline. Will follow acutely.      Recommendations for follow up therapy are one component of a multi-disciplinary discharge planning process, led by the attending physician.  Recommendations may be updated based on patient status, additional functional criteria and insurance authorization.   Follow Up Recommendations  SNF    Equipment Recommendations  None recommended by OT    Recommendations for Other Services       Precautions / Restrictions Precautions Precautions: Fall Restrictions RLE Weight Bearing: Weight bearing as tolerated      Mobility Bed Mobility Overal bed mobility: Needs Assistance Bed Mobility: Supine to Sit;Sit to Supine     Supine to sit: Max assist;+2 for physical  assistance;+2 for safety/equipment;HOB elevated Sit to supine: Total assist;+2 for physical assistance;+2 for safety/equipment;HOB elevated   General bed mobility comments: maxA+2 to move towards EOB, patient assist with trunk elevation. TotalA+2 for return to supine    Transfers                 General transfer comment: deferred due to patient reporting "wooziness".    Balance Overall balance assessment: Needs assistance;History of Falls Sitting-balance support: No upper extremity supported;Feet supported Sitting balance-Leahy Scale: Fair                                     ADL either performed or assessed with clinical judgement   ADL Overall ADL's : Needs assistance/impaired Eating/Feeding: Minimal assistance;Bed level   Grooming: Minimal assistance;Bed level   Upper Body Bathing: Minimal assistance;Bed level   Lower Body Bathing: Maximal assistance;Bed level   Upper Body Dressing : Moderate assistance;Bed level   Lower Body Dressing: Total assistance   Toilet Transfer:  (unable)           Functional mobility during ADLs: +2 for physical assistance (pt complaining of "wooziness")       Vision   Additional Comments: unsure if baseline visual deficits     Perception     Praxis      Pertinent Vitals/Pain Pain Assessment: Faces Faces Pain Scale: Hurts little more Pain Location: grimacing. Reports no pain when asked Pain Descriptors / Indicators: Grimacing Pain Intervention(s): Limited activity within patient's tolerance;Repositioned     Hand Dominance Right   Extremity/Trunk Assessment Upper Extremity Assessment Upper Extremity Assessment: Generalized weakness  Lower Extremity Assessment Lower Extremity Assessment: Defer to PT evaluation   Cervical / Trunk Assessment Cervical / Trunk Assessment: Kyphotic   Communication Communication Communication: Expressive difficulties (at times patient mumbles with incomprehensible  speech with times of clear speech)   Cognition Arousal/Alertness: Awake/alert Behavior During Therapy: WFL for tasks assessed/performed Overall Cognitive Status: No family/caregiver present to determine baseline cognitive functioning                                 General Comments: hx of dementia; unsure of baseline Following most commands appropriately; easily redirected to task; most likley more imparied s/p surgery, meds adn unfamiliar environment   General Comments  multiple bruises noted    Exercises     Shoulder Instructions      Home Living Family/patient expects to be discharged to:: Skilled nursing facility                                        Prior Functioning/Environment Level of Independence: Needs assistance  Gait / Transfers Assistance Needed: patient states use of RW but unreliable historian due to cognitive deficits ADL's / Homemaking Assistance Needed: pt most likely had assistance however per chart review, in April pt was living at home with husband and completing her ADL tasks independently   Comments: Poor historian due to hx of dementia        OT Problem List: Decreased strength;Decreased range of motion;Decreased activity tolerance;Impaired balance (sitting and/or standing);Decreased cognition;Decreased safety awareness;Decreased knowledge of use of DME or AE;Pain      OT Treatment/Interventions: Self-care/ADL training;Therapeutic exercise;DME and/or AE instruction;Therapeutic activities;Cognitive remediation/compensation;Patient/family education;Balance training    OT Goals(Current goals can be found in the care plan section) Acute Rehab OT Goals Patient Stated Goal: did not state OT Goal Formulation: Patient unable to participate in goal setting Time For Goal Achievement: 08/08/21 Potential to Achieve Goals: Good  OT Frequency: Min 2X/week   Barriers to D/C:            Co-evaluation PT/OT/SLP  Co-Evaluation/Treatment: Yes Reason for Co-Treatment: Necessary to address cognition/behavior during functional activity;For patient/therapist safety;To address functional/ADL transfers   OT goals addressed during session: ADL's and self-care      AM-PAC OT "6 Clicks" Daily Activity     Outcome Measure Help from another person eating meals?: A Little Help from another person taking care of personal grooming?: A Little Help from another person toileting, which includes using toliet, bedpan, or urinal?: Total Help from another person bathing (including washing, rinsing, drying)?: A Lot Help from another person to put on and taking off regular upper body clothing?: A Lot Help from another person to put on and taking off regular lower body clothing?: Total 6 Click Score: 12   End of Session Equipment Utilized During Treatment: Gait belt;Rolling walker Nurse Communication: Mobility status  Activity Tolerance: Patient limited by fatigue;Patient limited by pain Patient left: in bed;with call bell/phone within reach;with bed alarm set (modified chair position)  OT Visit Diagnosis: Unsteadiness on feet (R26.81);Other abnormalities of gait and mobility (R26.89);Muscle weakness (generalized) (M62.81);History of falling (Z91.81);Other symptoms and signs involving cognitive function;Pain;Dizziness and giddiness (R42) Pain - Right/Left: Right Pain - part of body: Hip;Leg                Time:  - 24 min   Charges:  mod ev  Maurie Boettcher, OT/L   Acute OT Clinical Specialist Acute Rehabilitation Services Pager [phone] Office [phone]   [facility] 07/25/2021, 3:20 PM

## 2021-07-25 NOTE — Consult Note (Signed)
   [facility] Inpatient Consult   07/25/2021  Camelia Stelzner [birth year] [id number]  [place] [facility] [ACO] Patient:  [facility] Medicare   Patient screened for less than 7 days readmission hospitalization with noted extreme high risk score for unplanned readmission risk.   Review of patient's medical record reveals patient is from nursing home level of care. Brief review as patient was LTC.  Plan:  Continue to follow progress and disposition to assess for post hospital care management needs.    For questions contact:   Natividad Brood, RN BSN [facility] Liaison  [phone] business mobile phone Toll free office [phone]  Fax number: [phone] [email or web address]

## 2021-07-25 NOTE — Social Work (Signed)
[facility] confirmed patient is LTC at their facility. She has legal Guardian (DSS) Ariana [name]. Another point of contact is [name] # [phone] per SNF.  CSW left voice message with patient's Legal [facility]) [place] # [phone] returned call   Thurmond Butts, MSW, LCSW Clinical Social Worker

## 2021-07-25 NOTE — Progress Notes (Signed)
PROGRESS NOTE  Coty Student ZOX:[id number] DOB: 1933/03/08   PCP: Caprice Renshaw, MD  Patient is from: Nursing home.  DOA: 07/21/2021 LOS: 4  Chief complaints:  Chief Complaint  Patient presents with   Fall    Pt is [facility] and pt fell, found in floor with hematoma to back of head.  See and treated at [facility] last month for fall with brain bleed.  EMS reports that pt will not straight right leg, no rotation noted.  Pt is confused and her normal baseline.  VSS.         Brief Narrative / Interim history: 87 year old F with PMH of ITP, asthma/COPD, Alzheimer's dementia, SAH, hypothyroidism, HTN, HLD and GERD brought to [facility] ED by EMS with right hip pain and head pain after she sustained a fall, and found to have impacted subcapital right femoral neck fracture.  She was transferred to [facility] for surgical intervention.  CT head showed slightly decreased size of anterior left frontal intraparenchymal hematoma.  Oncology/hematology consulted about thrombocytopenia and recommended IVIG and Nplate that she received on 10/10 and 10/11 respectively.  Also received 1 unit of platelet.  Thrombocytopenia improved.  Underwent cannulated hip pinning by Dr. Erlinda Hong for right femoral neck fracture on 10/13..   Subjective: Seen and examined earlier this morning.  No major events overnight of this morning.  No complaints but not a reliable historian.  She is only oriented to self.  Does not appear to be in distress.  Objective: Vitals:   07/25/21 0015 07/25/21 0340 07/25/21 0741 07/25/21 0910  BP: 134/70 (!) 148/78 124/64 111/65  Pulse: 74  73   Resp: 13  10   Temp:  98.7 F (37.1 C) 98 F (36.7 C)   TempSrc:  Oral Axillary   SpO2: 95%  95%   Weight:      Height:        Intake/Output Summary (Last 24 hours) at 07/25/2021 1056 Last data filed at 07/25/2021 0900 Gross per 24 hour  Intake 1706.4 ml  Output 430 ml  Net 1276.4 ml   Filed Weights   07/21/21 1502 07/24/21 1401  Weight: 60 kg 60  kg    Examination:  GENERAL: Frail looking elderly female.  No apparent distress. HEENT: MMM.  Vision and hearing grossly intact.  NECK: Supple.  No apparent JVD.  RESP: 95% on RA.  No IWOB.  Fair aeration bilaterally. CVS:  RRR. Heart sounds normal.  ABD/GI/GU: BS+. Abd soft, NTND.  MSK/EXT:  Moves extremities.  Significant muscle mass and subcu fat loss. SKIN: Dressing over right hip DCI. NEURO: Awake and alert.  Oriented only to self.  Follows commands.  No apparent focal neuro deficit. PSYCH: Calm. Normal affect.   Procedures:  10/13-ORIF of proximal right femoral fracture by Dr. Erlinda Hong  Microbiology summarized: COVID-19 and influenza PCR nonreactive.  Assessment & Plan: Fall at nursing facility Impacted subcapital right femoral neck fracture likely due to fall -ORIF of proximal right femoral fracture by Dr. Erlinda Hong on 10/13. -Pain control-with as needed fentanyl and scheduled Tylenol -SCD for VTE prophylaxis in the setting of thrombocytopenia -Bowel regimen -PT/OT -Fall precaution   Thrombocytopenia/ITP-s/p IVIG and Nplate on 81/19/1478.  Had platelet transfusion on 10/12.  Improving. Recent Labs  Lab 07/21/21 1851 07/22/21 0407 07/23/21 0356 07/23/21 0803 07/24/21 0127 07/25/21 0335  PLT 12* 27* PLATELET CLUMPS NOTED ON SMEAR, UNABLE TO ESTIMATE 67* 68* 69*  -Continue monitoring   History of subarachnoid hemorrhage-improved on CT head -No indication  for NS intervention per Dr. Christella Noa with NS  CKD-3B: Ruled out.  Leukocytosis/bandemia: Likely demargination.  -Continue monitoring  Hypokalemia: K3.6.  Mg 1.8. -K-Dur 40x1 -IV magnesium sulfate 2 g x 1  Hyperglycemia: Resolved.  No history of DM.   Prolonged QTc (511 ms)-exaggerated by wide QRS due to LBBB.  Hypothyroidism -Continue Synthroid   Essential hypertension: Normotensive. -Continue amlodipine  History of COPD/asthma: Stable -Continue home inhalers  Hyponatremia: Na 134.  Likely hypovolemic.  She  is also on Celexa which could contribute -Continue IV NS  GERD -Continue Protonix  Alzheimer's dementia without behavioral disturbance -Continue Namenda -Reorientation and delirium precautions  Anxiety: Stable. -Continue home Lexapro, Lamictal, gabapentin, BuSpar and Xanax  Severe protein calorie malnutrition: Significant muscle mass and subcu fat loss. Body mass index is 20.72 kg/m. Nutrition Problem: Severe Malnutrition Etiology: chronic illness (Alzheimer's disease, COPD) Signs/Symptoms: severe muscle depletion, severe fat depletion Interventions: Ensure Enlive (each supplement provides 350kcal and 20 grams of protein), MVI, Magic cup, Refer to RD note for recommendations   DVT prophylaxis:  SCDs Start: 07/24/21 2052 Place TED hose Start: 07/24/21 2052 SCDs Start: 07/21/21 2316  Code Status: DNR/DNI Family Communication: Patient and/or RN. Available if any question.  Level of care: Progressive.  Change level of care to MedSurg Status is: Inpatient  Remains inpatient appropriate because:Ongoing diagnostic testing needed not appropriate for outpatient work up, Unsafe d/c plan, and Inpatient level of care appropriate due to severity of illness  Dispo: The patient is from:  Nursing home              Anticipated d/c is to:  Nursing home              Patient currently is not medically stable to d/c.   Difficult to place patient No       Consultants:  Orthopedic surgery Oncology/hematology   Sch Meds:  Scheduled Meds:  sodium chloride   Intravenous Once   acetaminophen  1,000 mg Oral Q6H   amLODipine  10 mg Oral Daily   Chlorhexidine Gluconate Cloth  6 each Topical Daily   docusate sodium  100 mg Oral BID   feeding supplement  237 mL Oral BID BM   levothyroxine  50 mcg Oral QAC breakfast   memantine  10 mg Oral BID   multivitamin with minerals  1 tablet Oral Daily   pantoprazole (PROTONIX) IV  40 mg Intravenous Q24H   rosuvastatin  5 mg Oral QPM   Continuous  Infusions:  sodium chloride 75 mL/hr at 07/25/21 0506   0.9 % NaCl with KCl 20 mEq / L     methocarbamol (ROBAXIN) IV     PRN Meds:.acetaminophen, ALPRAZolam, alum & mag hydroxide-simeth, fentaNYL (SUBLIMAZE) injection, HYDROmorphone (DILAUDID) injection, menthol-cetylpyridinium **OR** phenol, methocarbamol **OR** methocarbamol (ROBAXIN) IV, ondansetron **OR** ondansetron (ZOFRAN) IV, oxyCODONE, oxyCODONE, polyethylene glycol, sorbitol  Antimicrobials: Anti-infectives (From admission, onward)    Start     Dose/Rate Route Frequency Ordered Stop   07/25/21 0600  ceFAZolin (ANCEF) IVPB 2g/100 mL premix        2 g 200 mL/hr over 30 Minutes Intravenous On call to O.R. 07/24/21 1340 07/24/21 1517   07/24/21 2145  ceFAZolin (ANCEF) IVPB 2g/100 mL premix        2 g 200 mL/hr over 30 Minutes Intravenous Every 6 hours 07/24/21 2051 07/25/21 0943   07/24/21 1313  ceFAZolin (ANCEF) 2-4 GM/100ML-% IVPB       Note to Pharmacy: Roosvelt Maser   : cabinet  override      07/24/21 1313 07/24/21 1516        I have personally reviewed the following labs and images: CBC: Recent Labs  Lab 07/21/21 1851 07/22/21 0407 07/23/21 0356 07/23/21 0803 07/24/21 0127 07/25/21 0335  WBC 18.8* 14.0* 15.6*  --  11.7* 15.0*  NEUTROABS 15.0*  --   --   --   --   --   HGB 13.0 11.6* 12.0  --  12.2 11.8*  HCT 38.7 35.3* 35.9*  --  36.2 35.1*  MCV 92.8 93.6 90.7  --  90.0 89.5  PLT 12* 27* PLATELET CLUMPS NOTED ON SMEAR, UNABLE TO ESTIMATE 67* 68* 69*   BMP &GFR Recent Labs  Lab 07/21/21 1851 07/22/21 0407 07/23/21 0356 07/24/21 0318 07/25/21 0335  NA 139 136 132* 134* 135  K 3.0* 3.8 4.3 3.6 3.6  CL 103 104 102 104 104  CO2 28 27 23  21* 23  GLUCOSE 136* 110* 93 85 81  BUN 16 14 12 15 19   CREATININE 0.88 0.67 0.61 0.68 0.74  CALCIUM 8.5* 8.5* 8.6* 8.7* 8.5*  MG  --  1.8  --  2.0 1.8  PHOS  --  2.7  --  3.4 3.6   Estimated Creatinine Clearance: 48.7 mL/min (by C-G formula based on SCr of 0.74  mg/dL). Liver & Pancreas: Recent Labs  Lab 07/21/21 1851 07/22/21 0407 07/24/21 0318 07/25/21 0335  AST 25 20  --   --   ALT 24 19  --   --   ALKPHOS 106 89  --   --   BILITOT 0.7 0.8  --   --   PROT 7.0 8.1  --   --   ALBUMIN 3.7 3.3* 2.8* 2.7*   No results for input(s): LIPASE, AMYLASE in the last 168 hours. No results for input(s): AMMONIA in the last 168 hours. Diabetic: No results for input(s): HGBA1C in the last 72 hours. No results for input(s): GLUCAP in the last 168 hours. Cardiac Enzymes: Recent Labs  Lab 07/24/21 0318  CKTOTAL 28*   No results for input(s): PROBNP in the last 8760 hours. Coagulation Profile: Recent Labs  Lab 07/22/21 0407  INR 1.0   Thyroid Function Tests: No results for input(s): TSH, T4TOTAL, FREET4, T3FREE, THYROIDAB in the last 72 hours. Lipid Profile: No results for input(s): CHOL, HDL, LDLCALC, TRIG, CHOLHDL, LDLDIRECT in the last 72 hours. Anemia Panel: No results for input(s): VITAMINB12, FOLATE, FERRITIN, TIBC, IRON, RETICCTPCT in the last 72 hours. Urine analysis:    Component Value Date/Time   COLORURINE AMBER (A) 07/13/2021 1011   APPEARANCEUR CLOUDY (A) 07/13/2021 1011   APPEARANCEUR Clear 09/03/2014 1200   LABSPEC 1.025 07/13/2021 1011   LABSPEC 1.018 09/03/2014 1200   PHURINE 5.0 07/13/2021 1011   GLUCOSEU NEGATIVE 07/13/2021 1011   GLUCOSEU Negative 09/03/2014 1200   HGBUR SMALL (A) 07/13/2021 1011   BILIRUBINUR NEGATIVE 07/13/2021 1011   BILIRUBINUR Negative 09/03/2014 1200   KETONESUR 5 (A) 07/13/2021 1011   PROTEINUR 30 (A) 07/13/2021 1011   NITRITE NEGATIVE 07/13/2021 1011   LEUKOCYTESUR SMALL (A) 07/13/2021 1011   LEUKOCYTESUR Negative 09/03/2014 1200   Sepsis Labs: Invalid input(s): PROCALCITONIN, [place]  Microbiology: Recent Results (from the past 240 hour(s))  Resp Panel by RT-PCR (Flu A&B, Covid) Nasopharyngeal Swab     Status: None   Collection Time: 07/21/21  7:54 PM   Specimen:  Nasopharyngeal Swab; Nasopharyngeal(NP) swabs in vial transport medium  Result Value Ref Range Status  SARS Coronavirus 2 by RT PCR NEGATIVE NEGATIVE Final    Comment: (NOTE) SARS-CoV-2 target nucleic acids are NOT DETECTED.  The SARS-CoV-2 RNA is generally detectable in upper respiratory specimens during the acute phase of infection. The lowest concentration of SARS-CoV-2 viral copies this assay can detect is 138 copies/mL. A negative result does not preclude SARS-Cov-2 infection and should not be used as the sole basis for treatment or other patient management decisions. A negative result may occur with  improper specimen collection/handling, submission of specimen other than nasopharyngeal swab, presence of viral mutation(s) within the areas targeted by this assay, and inadequate number of viral copies(<138 copies/mL). A negative result must be combined with clinical observations, patient history, and epidemiological information. The expected result is Negative.  Fact Sheet for Patients:  EntrepreneurPulse.com.au  Fact Sheet for Healthcare Providers:  IncredibleEmployment.be  This test is no t yet approved or cleared by the [place] FDA and  has been authorized for detection and/or diagnosis of SARS-CoV-2 by FDA under an Emergency Use Authorization (EUA). This EUA will remain  in effect (meaning this test can be used) for the duration of the COVID-19 declaration under Section 564(b)(1) of the Act, 21 U.S.C.section 360bbb-3(b)(1), unless the authorization is terminated  or revoked sooner.       Influenza A by PCR NEGATIVE NEGATIVE Final   Influenza B by PCR NEGATIVE NEGATIVE Final    Comment: (NOTE) The Xpert Xpress SARS-CoV-2/FLU/RSV plus assay is intended as an aid in the diagnosis of influenza from Nasopharyngeal swab specimens and should not be used as a sole basis for treatment. Nasal washings and aspirates are unacceptable for  Xpert Xpress SARS-CoV-2/FLU/RSV testing.  Fact Sheet for Patients: EntrepreneurPulse.com.au  Fact Sheet for Healthcare Providers: IncredibleEmployment.be  This test is not yet approved or cleared by the [place] FDA and has been authorized for detection and/or diagnosis of SARS-CoV-2 by FDA under an Emergency Use Authorization (EUA). This EUA will remain in effect (meaning this test can be used) for the duration of the COVID-19 declaration under Section 564(b)(1) of the Act, 21 U.S.C. section 360bbb-3(b)(1), unless the authorization is terminated or revoked.  Performed at [facility], [address] [place], [place] [address]     Radiology Studies: DG C-Arm 1-60 Min-No Report  Result Date: 07/24/2021 Fluoroscopy was utilized by the requesting physician.  No radiographic interpretation.   DG HIP OPERATIVE UNILAT WITH PELVIS RIGHT  Result Date: 07/24/2021 CLINICAL DATA:  Surgery, elective Z41.9 (ICD-10-CM) EXAM: OPERATIVE RIGHT HIP (WITH PELVIS IF PERFORMED)  VIEWS TECHNIQUE: Fluoroscopic spot image(s) were submitted for interpretation post-operatively. COMPARISON:  07/21/2021. FINDINGS: Fluoro time: 56 seconds. Reported radiation: 6.64 mGy. Two C-arm fluoroscopic images were obtained intraoperatively and submitted for post operative interpretation. These images demonstrate fixation of the right femoral neck fracture. Alignment appears slightly improved. Please see the performing provider's procedural report for further detail. IMPRESSION: Intraoperative fluoroscopy, as detailed above. Electronically Signed   By: Margaretha Sheffield M.D.   On: 07/24/2021 19:58      Bhavik Cabiness T. [place]  If 7PM-7AM, please contact night-coverage [email or web address] 07/25/2021, 10:56 AM

## 2021-07-25 NOTE — Progress Notes (Signed)
Subjective: 1 Day Post-Op Procedure(s) (LRB): CANNULATED HIP PINNING (Right) Patient resting comfortably in bed  Objective: Vital signs in last 24 hours: Temp:  [96.9 F (36.1 C)-98.7 F (37.1 C)] 98 F (36.7 C) (10/14 0741) Pulse Rate:  [73-98] 73 (10/14 0741) Resp:  [10-18] 10 (10/14 0741) BP: (109-170)/(64-99) 124/64 (10/14 0741) SpO2:  [91 %-97 %] 95 % (10/14 0741) Weight:  [60 kg] 60 kg (10/13 1401)  Intake/Output from previous day: 10/13 0701 - 10/14 0700 In: 1406.4 [I.V.:1006.4; IV Piggyback:400] Out: 1030 [Urine:1000; Blood:30] Intake/Output this shift: No intake/output data recorded.  Recent Labs    07/23/21 0356 07/24/21 0127 07/25/21 0335  HGB 12.0 12.2 11.8*   Recent Labs    07/24/21 0127 07/25/21 0335  WBC 11.7* 15.0*  RBC 4.02 3.92  HCT 36.2 35.1*  PLT 68* 69*   Recent Labs    07/24/21 0318 07/25/21 0335  NA 134* 135  K 3.6 3.6  CL 104 104  CO2 21* 23  BUN 15 19  CREATININE 0.68 0.74  GLUCOSE 85 81  CALCIUM 8.7* 8.5*   No results for input(s): LABPT, INR in the last 72 hours.  Intact pulses distally Dorsiflexion/Plantar flexion intact Incision: dressing C/D/I No cellulitis present Compartment soft   Assessment/Plan: 1 Day Post-Op Procedure(s) (LRB): CANNULATED HIP PINNING (Right) Up with therapy WBAT RLE ABLA- mild and stable Will defer dvt ppx to primary team due to ITP and thrombocytopenia F/u with Dr. Erlinda Hong 2 weeks post-op for suture removal     Aundra Dubin 07/25/2021, 7:55 AM

## 2021-07-26 DIAGNOSIS — S72001A Fracture of unspecified part of neck of right femur, initial encounter for closed fracture: Secondary | ICD-10-CM | POA: Diagnosis not present

## 2021-07-26 DIAGNOSIS — D693 Immune thrombocytopenic purpura: Secondary | ICD-10-CM | POA: Diagnosis not present

## 2021-07-26 DIAGNOSIS — E039 Hypothyroidism, unspecified: Secondary | ICD-10-CM | POA: Diagnosis not present

## 2021-07-26 DIAGNOSIS — W19XXXA Unspecified fall, initial encounter: Secondary | ICD-10-CM | POA: Diagnosis not present

## 2021-07-26 DIAGNOSIS — D649 Anemia, unspecified: Secondary | ICD-10-CM

## 2021-07-26 LAB — RENAL FUNCTION PANEL
Albumin: 2.8 g/dL — ABNORMAL LOW (ref 3.5–5.0)
Anion gap: 7 (ref 5–15)
BUN: 17 mg/dL (ref 8–23)
CO2: 24 mmol/L (ref 22–32)
Calcium: 8.8 mg/dL — ABNORMAL LOW (ref 8.9–10.3)
Chloride: 105 mmol/L (ref 98–111)
Creatinine, Ser: 0.66 mg/dL (ref 0.44–1.00)
GFR, Estimated: >60 mL/min (ref >60)
Glucose, Bld: 95 mg/dL (ref 70–99)
Phosphorus: 3 mg/dL (ref 2.5–4.6)
Potassium: 3.7 mmol/L (ref 3.5–5.1)
Sodium: 136 mmol/L (ref 135–145)

## 2021-07-26 LAB — CBC
HCT: 35.3 % — ABNORMAL LOW (ref 36.0–46.0)
Hemoglobin: 11.6 g/dL — ABNORMAL LOW (ref 12.0–15.0)
MCH: 29.8 pg (ref 26.0–34.0)
MCHC: 32.9 g/dL (ref 30.0–36.0)
MCV: 90.7 fL (ref 80.0–100.0)
Platelets: 65 10*3/uL — ABNORMAL LOW (ref 150–400)
RBC: 3.89 MIL/uL (ref 3.87–5.11)
RDW: 17 % — ABNORMAL HIGH (ref 11.5–15.5)
WBC: 15.1 10*3/uL — ABNORMAL HIGH (ref 4.0–10.5)
nRBC: 0 % (ref 0.0–0.2)

## 2021-07-26 LAB — MAGNESIUM: Magnesium: 2.1 mg/dL (ref 1.7–2.4)

## 2021-07-26 MED ORDER — SENNOSIDES-DOCUSATE SODIUM 8.6-50 MG PO TABS
1.0000 | ORAL_TABLET | Freq: Every day | ORAL | Status: DC
  Administered 2021-07-27 – 2021-07-30 (×4): 1 via ORAL
  Filled 2021-07-26 (×5): qty 1

## 2021-07-26 MED ORDER — POLYETHYLENE GLYCOL 3350 17 G PO PACK: 17.0000 g | PACK | Freq: Two times a day (BID) | ORAL | Status: DC | PRN

## 2021-07-26 NOTE — Progress Notes (Signed)
PROGRESS NOTE  Ariana [name] MWN:[id number] DOB: 06-13-1933   PCP: Caprice Renshaw, MD  Patient is from: Nursing home.  DOA: 07/21/2021 LOS: 5  Chief complaints:  Chief Complaint  Patient presents with   Fall    Pt is [place] and pt fell, found in floor with hematoma to back of head.  See and treated at [facility] last month for fall with brain bleed.  EMS reports that pt will not straight right leg, no rotation noted.  Pt is confused and her normal baseline.  VSS.         Brief Narrative / Interim history: 87 year old F with PMH of ITP, asthma/COPD, Alzheimer's dementia, SAH, hypothyroidism, HTN, HLD and GERD brought to [facility] ED by EMS with right hip pain and head pain after she sustained a fall, and found to have impacted subcapital right femoral neck fracture.  She was transferred to [facility] for surgical intervention.  CT head showed slightly decreased size of anterior left frontal intraparenchymal hematoma.  Oncology/hematology consulted about thrombocytopenia and recommended IVIG and Nplate that she received on 10/10 and 10/11 respectively.  Also received 1 unit of platelet.  Thrombocytopenia improved.  Underwent cannulated hip pinning by Dr. Erlinda Hong for right femoral neck fracture on 10/13. Therapy recommends SNF.  Subjective: Seen and examined earlier this morning.  No major events overnight of this morning.  No complaints but not a reliable historian.  She is awake and alert but only oriented to self.  She responds no to pain.  Does not appear to be in distress.  Objective: Vitals:   07/26/21 0000 07/26/21 0400 07/26/21 0815 07/26/21 1207  BP:  113/74 (!) 150/78 130/80  Pulse: 75 85 81 92  Resp:  16 16 16   Temp:  99.3 F (37.4 C) 98.8 F (37.1 C) 99.2 F (37.3 C)  TempSrc:  Oral Oral Oral  SpO2: 93% 94% 97% 95%  Weight:      Height:        Intake/Output Summary (Last 24 hours) at 07/26/2021 1512 Last data filed at 07/26/2021 0700 Gross per 24 hour  Intake 9.16 ml  Output  500 ml  Net -490.84 ml   Filed Weights   07/21/21 1502 07/24/21 1401  Weight: 60 kg 60 kg    Examination:  GENERAL: Frail looking elderly female.  No apparent distress. HEENT: MMM.  Vision and hearing grossly intact.  NECK: Supple.  No apparent JVD.  RESP: 95% on RA.  No IWOB.  Fair aeration bilaterally. CVS:  RRR. Heart sounds normal.  ABD/GI/GU: BS+. Abd soft, NTND.  MSK/EXT:  Moves extremities.  Significant muscle mass and subcu fat loss. SKIN: no apparent skin lesion or wound NEURO: Awake and alert.  Oriented to self.  No apparent focal neuro deficit. PSYCH: Calm. Normal affect.   Procedures:  10/13-ORIF of proximal right femoral fracture by Dr. Erlinda Hong  Microbiology summarized: COVID-19 and influenza PCR nonreactive.  Assessment & Plan: Fall at nursing facility Impacted subcapital right femoral neck fracture likely due to fall -ORIF of proximal right femoral fracture by Dr. Erlinda Hong on 10/13. -Pain fairly controlled. -SCD for VTE prophylaxis in the setting of thrombocytopenia -Bowel regimen -PT/OT -Fall precaution   Thrombocytopenia/ITP-s/p IVIG and Nplate on 40/34/7425.  Had platelet transfusion on 10/12.  Relatively stable. Recent Labs  Lab 07/21/21 1851 07/22/21 0407 07/23/21 0356 07/23/21 0803 07/24/21 0127 07/25/21 0335 07/26/21 0251  PLT 12* 27* PLATELET CLUMPS NOTED ON SMEAR, UNABLE TO ESTIMATE 67* 68* 69* 65*  -Continue monitoring  History of subarachnoid hemorrhage-improved on CT head -No indication for NS intervention per Dr. Christella Noa with NS  CKD-3B: Ruled out.  Leukocytosis/bandemia: Likely demargination.  -Continue monitoring  Hypokalemia: Resolved.  Hyperglycemia: Resolved.  No history of DM.   Prolonged QTc (511 ms)-exaggerated by wide QRS due to LBBB.  Hypothyroidism -Continue Synthroid   Essential hypertension: Normotensive. -Continue amlodipine  History of COPD/asthma: Stable -Continue home inhalers  Hyponatremia: Resolved with IV  NS.  GERD -Continue Protonix  Alzheimer's dementia without behavioral disturbance -Continue Namenda -Reorientation and delirium precautions  Anxiety: Stable. -Continue home Lexapro, Lamictal, gabapentin, BuSpar and Xanax  Normocytic anemia: H&H relatively stable after initial drop. Recent Labs    06/25/21 0952 07/11/21 1840 07/13/21 1011 07/15/21 0310 07/21/21 1851 07/22/21 0407 07/23/21 0356 07/24/21 0127 07/25/21 0335 07/26/21 0251  HGB 14.0 14.4 14.6 11.0* 13.0 11.6* 12.0 12.2 11.8* 11.6*  -Monitor  Leukocytosis: Likely demargination.  Low suspicion for infection. -Check CBC with differential in the morning  Severe protein calorie malnutrition: Significant muscle mass and subcu fat loss. Body mass index is 20.72 kg/m. Nutrition Problem: Severe Malnutrition Etiology: chronic illness (Alzheimer's disease, COPD) Signs/Symptoms: severe muscle depletion, severe fat depletion Interventions: Ensure Enlive (each supplement provides 350kcal and 20 grams of protein), MVI, Magic cup, Refer to RD note for recommendations   DVT prophylaxis:  SCDs Start: 07/24/21 2052 Place TED hose Start: 07/24/21 2052 SCDs Start: 07/21/21 2316  Code Status: DNR/DNI Family Communication: Patient and/or RN. Available if any question.  Level of care: Med-Surg.   Status is: Inpatient  Remains inpatient appropriate because:Unsafe d/c plan  Dispo: The patient is from:  Nursing home              Anticipated d/c is to:  SNF              Patient currently is medically stable to d/c.   Difficult to place patient No       Consultants:  Orthopedic surgery Oncology/hematology   Sch Meds:  Scheduled Meds:  sodium chloride   Intravenous Once   amLODipine  10 mg Oral Daily   Chlorhexidine Gluconate Cloth  6 each Topical Daily   docusate sodium  100 mg Oral BID   feeding supplement  237 mL Oral BID BM   levothyroxine  50 mcg Oral QAC breakfast   mouth rinse  15 mL Mouth Rinse BID    memantine  10 mg Oral BID   multivitamin with minerals  1 tablet Oral Daily   pantoprazole  40 mg Oral Daily   rosuvastatin  5 mg Oral QPM   Continuous Infusions:  sodium chloride Stopped (07/25/21 1537)   methocarbamol (ROBAXIN) IV     PRN Meds:.acetaminophen, ALPRAZolam, alum & mag hydroxide-simeth, fentaNYL (SUBLIMAZE) injection, HYDROmorphone (DILAUDID) injection, menthol-cetylpyridinium **OR** phenol, methocarbamol **OR** methocarbamol (ROBAXIN) IV, ondansetron **OR** ondansetron (ZOFRAN) IV, oxyCODONE, oxyCODONE, polyethylene glycol, sorbitol  Antimicrobials: Anti-infectives (From admission, onward)    Start     Dose/Rate Route Frequency Ordered Stop   07/25/21 0600  ceFAZolin (ANCEF) IVPB 2g/100 mL premix        2 g 200 mL/hr over 30 Minutes Intravenous On call to O.R. 07/24/21 1340 07/24/21 1517   07/24/21 2145  ceFAZolin (ANCEF) IVPB 2g/100 mL premix        2 g 200 mL/hr over 30 Minutes Intravenous Every 6 hours 07/24/21 2051 07/25/21 0943   07/24/21 1313  ceFAZolin (ANCEF) 2-4 GM/100ML-% IVPB       Note to Pharmacy: Grandville Silos,  LUISA   : cabinet override      07/24/21 1313 07/24/21 1516        I have personally reviewed the following labs and images: CBC: Recent Labs  Lab 07/21/21 1851 07/22/21 0407 07/23/21 0356 07/23/21 0803 07/24/21 0127 07/25/21 0335 07/26/21 0251  WBC 18.8* 14.0* 15.6*  --  11.7* 15.0* 15.1*  NEUTROABS 15.0*  --   --   --   --   --   --   HGB 13.0 11.6* 12.0  --  12.2 11.8* 11.6*  HCT 38.7 35.3* 35.9*  --  36.2 35.1* 35.3*  MCV 92.8 93.6 90.7  --  90.0 89.5 90.7  PLT 12* 27* PLATELET CLUMPS NOTED ON SMEAR, UNABLE TO ESTIMATE 67* 68* 69* 65*   BMP &GFR Recent Labs  Lab 07/22/21 0407 07/23/21 0356 07/24/21 0318 07/25/21 0335 07/26/21 0251  NA 136 132* 134* 135 136  K 3.8 4.3 3.6 3.6 3.7  CL 104 102 104 104 105  CO2 27 23 21* 23 24  GLUCOSE 110* 93 85 81 95  BUN 14 12 15 19 17   CREATININE 0.67 0.61 0.68 0.74 0.66  CALCIUM 8.5*  8.6* 8.7* 8.5* 8.8*  MG 1.8  --  2.0 1.8 2.1  PHOS 2.7  --  3.4 3.6 3.0   Estimated Creatinine Clearance: 48.7 mL/min (by C-G formula based on SCr of 0.66 mg/dL). Liver & Pancreas: Recent Labs  Lab 07/21/21 1851 07/22/21 0407 07/24/21 0318 07/25/21 0335 07/26/21 0251  AST 25 20  --   --   --   ALT 24 19  --   --   --   ALKPHOS 106 89  --   --   --   BILITOT 0.7 0.8  --   --   --   PROT 7.0 8.1  --   --   --   ALBUMIN 3.7 3.3* 2.8* 2.7* 2.8*   No results for input(s): LIPASE, AMYLASE in the last 168 hours. No results for input(s): AMMONIA in the last 168 hours. Diabetic: No results for input(s): HGBA1C in the last 72 hours. No results for input(s): GLUCAP in the last 168 hours. Cardiac Enzymes: Recent Labs  Lab 07/24/21 0318  CKTOTAL 28*   No results for input(s): PROBNP in the last 8760 hours. Coagulation Profile: Recent Labs  Lab 07/22/21 0407  INR 1.0   Thyroid Function Tests: No results for input(s): TSH, T4TOTAL, FREET4, T3FREE, THYROIDAB in the last 72 hours. Lipid Profile: No results for input(s): CHOL, HDL, LDLCALC, TRIG, CHOLHDL, LDLDIRECT in the last 72 hours. Anemia Panel: No results for input(s): VITAMINB12, FOLATE, FERRITIN, TIBC, IRON, RETICCTPCT in the last 72 hours. Urine analysis:    Component Value Date/Time   COLORURINE AMBER (A) 07/13/2021 1011   APPEARANCEUR CLOUDY (A) 07/13/2021 1011   APPEARANCEUR Clear 09/03/2014 1200   LABSPEC 1.025 07/13/2021 1011   LABSPEC 1.018 09/03/2014 1200   PHURINE 5.0 07/13/2021 1011   GLUCOSEU NEGATIVE 07/13/2021 1011   GLUCOSEU Negative 09/03/2014 1200   HGBUR SMALL (A) 07/13/2021 1011   BILIRUBINUR NEGATIVE 07/13/2021 1011   BILIRUBINUR Negative 09/03/2014 1200   KETONESUR 5 (A) 07/13/2021 1011   PROTEINUR 30 (A) 07/13/2021 1011   NITRITE NEGATIVE 07/13/2021 1011   LEUKOCYTESUR SMALL (A) 07/13/2021 1011   LEUKOCYTESUR Negative 09/03/2014 1200   Sepsis Labs: Invalid input(s): PROCALCITONIN,  [place]  Microbiology: Recent Results (from the past 240 hour(s))  Resp Panel by RT-PCR (Flu A&B, Covid) Nasopharyngeal Swab  Status: None   Collection Time: 07/21/21  7:54 PM   Specimen: Nasopharyngeal Swab; Nasopharyngeal(NP) swabs in vial transport medium  Result Value Ref Range Status   SARS Coronavirus 2 by RT PCR NEGATIVE NEGATIVE Final    Comment: (NOTE) SARS-CoV-2 target nucleic acids are NOT DETECTED.  The SARS-CoV-2 RNA is generally detectable in upper respiratory specimens during the acute phase of infection. The lowest concentration of SARS-CoV-2 viral copies this assay can detect is 138 copies/mL. A negative result does not preclude SARS-Cov-2 infection and should not be used as the sole basis for treatment or other patient management decisions. A negative result may occur with  improper specimen collection/handling, submission of specimen other than nasopharyngeal swab, presence of viral mutation(s) within the areas targeted by this assay, and inadequate number of viral copies(<138 copies/mL). A negative result must be combined with clinical observations, patient history, and epidemiological information. The expected result is Negative.  Fact Sheet for Patients:  EntrepreneurPulse.com.au  Fact Sheet for Healthcare Providers:  IncredibleEmployment.be  This test is no t yet approved or cleared by the [place] FDA and  has been authorized for detection and/or diagnosis of SARS-CoV-2 by FDA under an Emergency Use Authorization (EUA). This EUA will remain  in effect (meaning this test can be used) for the duration of the COVID-19 declaration under Section 564(b)(1) of the Act, 21 U.S.C.section 360bbb-3(b)(1), unless the authorization is terminated  or revoked sooner.       Influenza A by PCR NEGATIVE NEGATIVE Final   Influenza B by PCR NEGATIVE NEGATIVE Final    Comment: (NOTE) The Xpert Xpress SARS-CoV-2/FLU/RSV plus  assay is intended as an aid in the diagnosis of influenza from Nasopharyngeal swab specimens and should not be used as a sole basis for treatment. Nasal washings and aspirates are unacceptable for Xpert Xpress SARS-CoV-2/FLU/RSV testing.  Fact Sheet for Patients: EntrepreneurPulse.com.au  Fact Sheet for Healthcare Providers: IncredibleEmployment.be  This test is not yet approved or cleared by the [place] FDA and has been authorized for detection and/or diagnosis of SARS-CoV-2 by FDA under an Emergency Use Authorization (EUA). This EUA will remain in effect (meaning this test can be used) for the duration of the COVID-19 declaration under Section 564(b)(1) of the Act, 21 U.S.C. section 360bbb-3(b)(1), unless the authorization is terminated or revoked.  Performed at [facility], [address], [place], [place] [address]     Radiology Studies: No results found.    Shaya Altamura T. [place]  If 7PM-7AM, please contact night-coverage [email or web address] 07/26/2021, 3:12 PM

## 2021-07-26 NOTE — Progress Notes (Signed)
Pt's cousin, Ilda Basset, called RN to request information about pt's status. Erasmo Downer states that she is the pt's cousin, and thinks of the pt as her aunt because of the age gap. Pt's son passed away and has no other relatives. Erasmo Downer is currently attempting to become pt's legal guardian and is requesting a call from the case worker when able. Erasmo Downer also has questions for the doctor concerning pt's status and brain bleed progression.   Justice Rocher, RN

## 2021-07-27 DIAGNOSIS — S72001A Fracture of unspecified part of neck of right femur, initial encounter for closed fracture: Secondary | ICD-10-CM | POA: Diagnosis not present

## 2021-07-27 DIAGNOSIS — D693 Immune thrombocytopenic purpura: Secondary | ICD-10-CM | POA: Diagnosis not present

## 2021-07-27 DIAGNOSIS — E039 Hypothyroidism, unspecified: Secondary | ICD-10-CM | POA: Diagnosis not present

## 2021-07-27 DIAGNOSIS — W19XXXA Unspecified fall, initial encounter: Secondary | ICD-10-CM | POA: Diagnosis not present

## 2021-07-27 LAB — CBC WITH DIFFERENTIAL/PLATELET
Abs Immature Granulocytes: 0.09 10*3/uL — ABNORMAL HIGH (ref 0.00–0.07)
Basophils Absolute: 0.1 10*3/uL (ref 0.0–0.1)
Basophils Relative: 1 %
Eosinophils Absolute: 0.2 10*3/uL (ref 0.0–0.5)
Eosinophils Relative: 1 %
HCT: 35.5 % — ABNORMAL LOW (ref 36.0–46.0)
Hemoglobin: 12 g/dL (ref 12.0–15.0)
Immature Granulocytes: 1 %
Lymphocytes Relative: 15 %
Lymphs Abs: 2.4 10*3/uL (ref 0.7–4.0)
MCH: 30.5 pg (ref 26.0–34.0)
MCHC: 33.8 g/dL (ref 30.0–36.0)
MCV: 90.1 fL (ref 80.0–100.0)
Monocytes Absolute: 1.5 10*3/uL — ABNORMAL HIGH (ref 0.1–1.0)
Monocytes Relative: 10 %
Neutro Abs: 11.5 10*3/uL — ABNORMAL HIGH (ref 1.7–7.7)
Neutrophils Relative %: 72 %
Platelets: UNDETERMINED 10*3/uL (ref 150–400)
RBC: 3.94 MIL/uL (ref 3.87–5.11)
RDW: 16.8 % — ABNORMAL HIGH (ref 11.5–15.5)
Smear Review: UNDETERMINED
WBC: 15.9 10*3/uL — ABNORMAL HIGH (ref 4.0–10.5)
nRBC: 0 % (ref 0.0–0.2)

## 2021-07-27 LAB — URINALYSIS, ROUTINE W REFLEX MICROSCOPIC
Bilirubin Urine: NEGATIVE
Glucose, UA: NEGATIVE mg/dL
Hgb urine dipstick: NEGATIVE
Ketones, ur: 20 mg/dL — AB
Leukocytes,Ua: NEGATIVE
Nitrite: NEGATIVE
Protein, ur: NEGATIVE mg/dL
Specific Gravity, Urine: 1.019 (ref 1.005–1.030)
pH: 5 (ref 5.0–8.0)

## 2021-07-27 LAB — PLATELET COUNT: Platelets: UNDETERMINED 10*3/uL (ref 150–400)

## 2021-07-27 MED ORDER — CEFTRIAXONE SODIUM 1 G IJ SOLR
1.0000 g | INTRAMUSCULAR | Status: DC
  Administered 2021-07-27 – 2021-07-29 (×3): 1 g via INTRAVENOUS
  Filled 2021-07-27 (×4): qty 10

## 2021-07-27 NOTE — Progress Notes (Signed)
PROGRESS NOTE  Ariana [name] XBL:[id number] DOB: 1933-01-31   PCP: Caprice Renshaw, MD  Patient is from: Nursing home.  DOA: 07/21/2021 LOS: 6  Chief complaints:  Chief Complaint  Patient presents with   Fall    Pt is [place] and pt fell, found in floor with hematoma to back of head.  See and treated at [facility] last month for fall with brain bleed.  EMS reports that pt will not straight right leg, no rotation noted.  Pt is confused and her normal baseline.  VSS.         Brief Narrative / Interim history: 87 year old F with PMH of ITP, asthma/COPD, Alzheimer's dementia, SAH, hypothyroidism, HTN, HLD and GERD brought to [facility] ED by EMS with right hip pain and head pain after she sustained a fall, and found to have impacted subcapital right femoral neck fracture.  She was transferred to [facility] for surgical intervention.  CT head showed slightly decreased size of anterior left frontal intraparenchymal hematoma.  Oncology/hematology consulted about thrombocytopenia and recommended IVIG and Nplate that she received on 10/10 and 10/11 respectively.  Also received 1 unit of platelet.  Thrombocytopenia improved.  Underwent cannulated hip pinning by Dr. Erlinda Hong for right femoral neck fracture on 10/13. Therapy recommends SNF.  Subjective: Seen and examined earlier this morning.  No major events overnight of this morning.  No complaints but not a reliable historian.  She is only oriented to self.  She responds no to review of system questions.  Objective: Vitals:   07/27/21 0034 07/27/21 0353 07/27/21 0822 07/27/21 1115  BP: (!) 152/86 130/76 (!) 148/81 (!) 150/81  Pulse:  82 81 81  Resp:  18 18 18   Temp: 98.3 F (36.8 C) 98.4 F (36.9 C) 99.3 F (37.4 C) 99.1 F (37.3 C)  TempSrc: Oral Oral Oral   SpO2: 97% 97% 98% 100%  Weight:      Height:        Intake/Output Summary (Last 24 hours) at 07/27/2021 1803 Last data filed at 07/27/2021 1646 Gross per 24 hour  Intake 1040.18 ml  Output  630 ml  Net 410.18 ml   Filed Weights   07/21/21 1502 07/24/21 1401  Weight: 60 kg 60 kg    Examination:  GENERAL: No apparent distress.  Nontoxic. HEENT: MMM.  Vision and hearing grossly intact.  NECK: Supple.  No apparent JVD.  RESP: 97% on RA.  No IWOB.  Fair aeration bilaterally. CVS:  RRR. Heart sounds normal.  ABD/GI/GU: BS+. Abd soft.  Suprapubic tenderness. MSK/EXT:  Moves extremities.  Significant muscle mass and subcu fat loss. SKIN: no apparent skin lesion or wound NEURO: Awake.  Only oriented to self.  Follows command.  No apparent focal neuro deficit. PSYCH: Calm. Normal affect.   Procedures:  10/13-ORIF of proximal right femoral fracture by Dr. Erlinda Hong  Microbiology summarized: COVID-19 and influenza PCR nonreactive.  Assessment & Plan: Fall at nursing facility Impacted subcapital right femoral neck fracture likely due to fall -ORIF of proximal right femoral fracture by Dr. Erlinda Hong on 10/13. -Pain fairly controlled. -SCD for VTE prophylaxis in the setting of thrombocytopenia -Bowel regimen -PT/OT -Fall precaution   Thrombocytopenia/ITP-s/p IVIG and Nplate on 30/04/6225.  Had platelet transfusion on 10/12. Recent Labs  Lab 07/21/21 1851 07/22/21 0407 07/23/21 0356 07/23/21 0803 07/24/21 0127 07/25/21 0335 07/26/21 0251 07/27/21 0627 07/27/21 1000  PLT 12* 27* PLATELET CLUMPS NOTED ON SMEAR, UNABLE TO ESTIMATE 67* 68* 69* 65* PLATELET CLUMPS NOTED ON SMEAR, UNABLE  TO ESTIMATE PLATELET CLUMPS NOTED ON SMEAR, UNABLE TO ESTIMATE  -Continue monitoring   History of subarachnoid hemorrhage-improved on CT head -No indication for NS intervention per Dr. Christella Noa with NS  CKD-3B: Ruled out.  Persistent leukocytosis/bandemia: Initially felt to be demargination but persistent.  She has no respiratory or GI symptoms.  She has urinary incontinence with suprapubic tenderness -Check urinalysis and urine culture -Start IV ceftriaxone -Recheck in the  morning  Hypokalemia: Resolved.  Hyperglycemia: Resolved.  No history of DM.   Prolonged QTc (511 ms)-exaggerated by wide QRS due to LBBB.  Hypothyroidism -Continue Synthroid   Essential hypertension: Normotensive. -Continue amlodipine  History of COPD/asthma: Stable -Continue home inhalers  Hyponatremia: Resolved with IV NS.  GERD -Continue Protonix  Alzheimer's dementia without behavioral disturbance: Only oriented to self. -Continue Namenda -Reorientation and delirium precautions  Anxiety: Stable. -Continue home Lexapro, Lamictal, gabapentin, BuSpar and Xanax  Normocytic anemia: H&H relatively stable after initial drop. Recent Labs    07/11/21 1840 07/13/21 1011 07/15/21 0310 07/21/21 1851 07/22/21 0407 07/23/21 0356 07/24/21 0127 07/25/21 0335 07/26/21 0251 07/27/21 0627  HGB 14.4 14.6 11.0* 13.0 11.6* 12.0 12.2 11.8* 11.6* 12.0  -Monitor  Severe protein calorie malnutrition: Significant muscle mass and subcu fat loss. Body mass index is 20.72 kg/m. Nutrition Problem: Severe Malnutrition Etiology: chronic illness (Alzheimer's disease, COPD) Signs/Symptoms: severe muscle depletion, severe fat depletion Interventions: Ensure Enlive (each supplement provides 350kcal and 20 grams of protein), MVI, Magic cup, Refer to RD note for recommendations   DVT prophylaxis:  SCDs Start: 07/24/21 2052 Place TED hose Start: 07/24/21 2052 SCDs Start: 07/21/21 2316  Code Status: DNR/DNI Family Communication: Updated patient's cousin over the phone. Level of care: Med-Surg.   Status is: Inpatient  Remains inpatient appropriate because:Unsafe d/c plan and IV treatments appropriate due to intensity of illness or inability to take PO  Dispo: The patient is from:  Nursing home              Anticipated d/c is to:  SNF              Patient currently is medically stable to d/c.   Difficult to place patient No    Consultants:  Orthopedic  surgery Oncology/hematology   Sch Meds:  Scheduled Meds:  sodium chloride   Intravenous Once   amLODipine  10 mg Oral Daily   Chlorhexidine Gluconate Cloth  6 each Topical Daily   feeding supplement  237 mL Oral BID BM   levothyroxine  50 mcg Oral QAC breakfast   mouth rinse  15 mL Mouth Rinse BID   memantine  10 mg Oral BID   multivitamin with minerals  1 tablet Oral Daily   pantoprazole  40 mg Oral Daily   rosuvastatin  5 mg Oral QPM   senna-docusate  1 tablet Oral Daily   Continuous Infusions:  sodium chloride 75 mL/hr at 07/27/21 1117   cefTRIAXone (ROCEPHIN)  IV 1 g (07/27/21 1625)   methocarbamol (ROBAXIN) IV     PRN Meds:.acetaminophen, ALPRAZolam, alum & mag hydroxide-simeth, fentaNYL (SUBLIMAZE) injection, HYDROmorphone (DILAUDID) injection, menthol-cetylpyridinium **OR** phenol, methocarbamol **OR** methocarbamol (ROBAXIN) IV, ondansetron **OR** ondansetron (ZOFRAN) IV, oxyCODONE, oxyCODONE, polyethylene glycol, sorbitol  Antimicrobials: Anti-infectives (From admission, onward)    Start     Dose/Rate Route Frequency Ordered Stop   07/27/21 1145  cefTRIAXone (ROCEPHIN) 1 g in sodium chloride 0.9 % 100 mL IVPB        1 g 200 mL/hr over 30 Minutes Intravenous Every  24 hours 07/27/21 1052 08/01/21 1144   07/25/21 0600  ceFAZolin (ANCEF) IVPB 2g/100 mL premix        2 g 200 mL/hr over 30 Minutes Intravenous On call to O.R. 07/24/21 1340 07/24/21 1517   07/24/21 2145  ceFAZolin (ANCEF) IVPB 2g/100 mL premix        2 g 200 mL/hr over 30 Minutes Intravenous Every 6 hours 07/24/21 2051 07/25/21 0943   07/24/21 1313  ceFAZolin (ANCEF) 2-4 GM/100ML-% IVPB       Note to Pharmacy: Roosvelt Maser   : cabinet override      07/24/21 1313 07/24/21 1516        I have personally reviewed the following labs and images: CBC: Recent Labs  Lab 07/21/21 1851 07/22/21 0407 07/23/21 0356 07/23/21 0803 07/24/21 0127 07/25/21 0335 07/26/21 0251 07/27/21 0627 07/27/21 1000   WBC 18.8*   < > 15.6*  --  11.7* 15.0* 15.1* 15.9*  --   NEUTROABS 15.0*  --   --   --   --   --   --  11.5*  --   HGB 13.0   < > 12.0  --  12.2 11.8* 11.6* 12.0  --   HCT 38.7   < > 35.9*  --  36.2 35.1* 35.3* 35.5*  --   MCV 92.8   < > 90.7  --  90.0 89.5 90.7 90.1  --   PLT 12*   < > PLATELET CLUMPS NOTED ON SMEAR, UNABLE TO ESTIMATE   < > 68* 69* 65* PLATELET CLUMPS NOTED ON SMEAR, UNABLE TO ESTIMATE PLATELET CLUMPS NOTED ON SMEAR, UNABLE TO ESTIMATE   < > = values in this interval not displayed.   BMP &GFR Recent Labs  Lab 07/22/21 0407 07/23/21 0356 07/24/21 0318 07/25/21 0335 07/26/21 0251  NA 136 132* 134* 135 136  K 3.8 4.3 3.6 3.6 3.7  CL 104 102 104 104 105  CO2 27 23 21* 23 24  GLUCOSE 110* 93 85 81 95  BUN 14 12 15 19 17   CREATININE 0.67 0.61 0.68 0.74 0.66  CALCIUM 8.5* 8.6* 8.7* 8.5* 8.8*  MG 1.8  --  2.0 1.8 2.1  PHOS 2.7  --  3.4 3.6 3.0   Estimated Creatinine Clearance: 48.7 mL/min (by C-G formula based on SCr of 0.66 mg/dL). Liver & Pancreas: Recent Labs  Lab 07/21/21 1851 07/22/21 0407 07/24/21 0318 07/25/21 0335 07/26/21 0251  AST 25 20  --   --   --   ALT 24 19  --   --   --   ALKPHOS 106 89  --   --   --   BILITOT 0.7 0.8  --   --   --   PROT 7.0 8.1  --   --   --   ALBUMIN 3.7 3.3* 2.8* 2.7* 2.8*   No results for input(s): LIPASE, AMYLASE in the last 168 hours. No results for input(s): AMMONIA in the last 168 hours. Diabetic: No results for input(s): HGBA1C in the last 72 hours. No results for input(s): GLUCAP in the last 168 hours. Cardiac Enzymes: Recent Labs  Lab 07/24/21 0318  CKTOTAL 28*   No results for input(s): PROBNP in the last 8760 hours. Coagulation Profile: Recent Labs  Lab 07/22/21 0407  INR 1.0   Thyroid Function Tests: No results for input(s): TSH, T4TOTAL, FREET4, T3FREE, THYROIDAB in the last 72 hours. Lipid Profile: No results for input(s): CHOL, HDL, LDLCALC, TRIG, CHOLHDL, LDLDIRECT  in the last 72  hours. Anemia Panel: No results for input(s): VITAMINB12, FOLATE, FERRITIN, TIBC, IRON, RETICCTPCT in the last 72 hours. Urine analysis:    Component Value Date/Time   COLORURINE AMBER (A) 07/13/2021 1011   APPEARANCEUR CLOUDY (A) 07/13/2021 1011   APPEARANCEUR Clear 09/03/2014 1200   LABSPEC 1.025 07/13/2021 1011   LABSPEC 1.018 09/03/2014 1200   PHURINE 5.0 07/13/2021 1011   GLUCOSEU NEGATIVE 07/13/2021 1011   GLUCOSEU Negative 09/03/2014 1200   HGBUR SMALL (A) 07/13/2021 1011   BILIRUBINUR NEGATIVE 07/13/2021 1011   BILIRUBINUR Negative 09/03/2014 1200   KETONESUR 5 (A) 07/13/2021 1011   PROTEINUR 30 (A) 07/13/2021 1011   NITRITE NEGATIVE 07/13/2021 1011   LEUKOCYTESUR SMALL (A) 07/13/2021 1011   LEUKOCYTESUR Negative 09/03/2014 1200   Sepsis Labs: Invalid input(s): PROCALCITONIN, [place]  Microbiology: Recent Results (from the past 240 hour(s))  Resp Panel by RT-PCR (Flu A&B, Covid) Nasopharyngeal Swab     Status: None   Collection Time: 07/21/21  7:54 PM   Specimen: Nasopharyngeal Swab; Nasopharyngeal(NP) swabs in vial transport medium  Result Value Ref Range Status   SARS Coronavirus 2 by RT PCR NEGATIVE NEGATIVE Final    Comment: (NOTE) SARS-CoV-2 target nucleic acids are NOT DETECTED.  The SARS-CoV-2 RNA is generally detectable in upper respiratory specimens during the acute phase of infection. The lowest concentration of SARS-CoV-2 viral copies this assay can detect is 138 copies/mL. A negative result does not preclude SARS-Cov-2 infection and should not be used as the sole basis for treatment or other patient management decisions. A negative result may occur with  improper specimen collection/handling, submission of specimen other than nasopharyngeal swab, presence of viral mutation(s) within the areas targeted by this assay, and inadequate number of viral copies(<138 copies/mL). A negative result must be combined with clinical observations, patient  history, and epidemiological information. The expected result is Negative.  Fact Sheet for Patients:  EntrepreneurPulse.com.au  Fact Sheet for Healthcare Providers:  IncredibleEmployment.be  This test is no t yet approved or cleared by the [place] FDA and  has been authorized for detection and/or diagnosis of SARS-CoV-2 by FDA under an Emergency Use Authorization (EUA). This EUA will remain  in effect (meaning this test can be used) for the duration of the COVID-19 declaration under Section 564(b)(1) of the Act, 21 U.S.C.section 360bbb-3(b)(1), unless the authorization is terminated  or revoked sooner.       Influenza A by PCR NEGATIVE NEGATIVE Final   Influenza B by PCR NEGATIVE NEGATIVE Final    Comment: (NOTE) The Xpert Xpress SARS-CoV-2/FLU/RSV plus assay is intended as an aid in the diagnosis of influenza from Nasopharyngeal swab specimens and should not be used as a sole basis for treatment. Nasal washings and aspirates are unacceptable for Xpert Xpress SARS-CoV-2/FLU/RSV testing.  Fact Sheet for Patients: EntrepreneurPulse.com.au  Fact Sheet for Healthcare Providers: IncredibleEmployment.be  This test is not yet approved or cleared by the [place] FDA and has been authorized for detection and/or diagnosis of SARS-CoV-2 by FDA under an Emergency Use Authorization (EUA). This EUA will remain in effect (meaning this test can be used) for the duration of the COVID-19 declaration under Section 564(b)(1) of the Act, 21 U.S.C. section 360bbb-3(b)(1), unless the authorization is terminated or revoked.  Performed at [facility], [address], [place], [place] [address]     Radiology Studies: No results found.    Natane Heward T. Haines  If 7PM-7AM, please contact night-coverage [email or web address] 07/27/2021, 6:03 PM

## 2021-07-28 DIAGNOSIS — D693 Immune thrombocytopenic purpura: Secondary | ICD-10-CM | POA: Diagnosis not present

## 2021-07-28 DIAGNOSIS — E039 Hypothyroidism, unspecified: Secondary | ICD-10-CM | POA: Diagnosis not present

## 2021-07-28 DIAGNOSIS — W19XXXA Unspecified fall, initial encounter: Secondary | ICD-10-CM | POA: Diagnosis not present

## 2021-07-28 DIAGNOSIS — S72001A Fracture of unspecified part of neck of right femur, initial encounter for closed fracture: Secondary | ICD-10-CM | POA: Diagnosis not present

## 2021-07-28 LAB — RENAL FUNCTION PANEL
Albumin: 2.7 g/dL — ABNORMAL LOW (ref 3.5–5.0)
Anion gap: 8 (ref 5–15)
BUN: 17 mg/dL (ref 8–23)
CO2: 24 mmol/L (ref 22–32)
Calcium: 8.8 mg/dL — ABNORMAL LOW (ref 8.9–10.3)
Chloride: 105 mmol/L (ref 98–111)
Creatinine, Ser: 0.6 mg/dL (ref 0.44–1.00)
GFR, Estimated: >60 mL/min (ref >60)
Glucose, Bld: 107 mg/dL — ABNORMAL HIGH (ref 70–99)
Phosphorus: 3.1 mg/dL (ref 2.5–4.6)
Potassium: 3.4 mmol/L — ABNORMAL LOW (ref 3.5–5.1)
Sodium: 137 mmol/L (ref 135–145)

## 2021-07-28 LAB — CBC WITH DIFFERENTIAL/PLATELET
Abs Immature Granulocytes: 0.07 10*3/uL (ref 0.00–0.07)
Basophils Absolute: 0.1 10*3/uL (ref 0.0–0.1)
Basophils Relative: 1 %
Eosinophils Absolute: 0.2 10*3/uL (ref 0.0–0.5)
Eosinophils Relative: 2 %
HCT: 36.4 % (ref 36.0–46.0)
Hemoglobin: 11.9 g/dL — ABNORMAL LOW (ref 12.0–15.0)
Immature Granulocytes: 1 %
Lymphocytes Relative: 16 %
Lymphs Abs: 2.4 10*3/uL (ref 0.7–4.0)
MCH: 29.6 pg (ref 26.0–34.0)
MCHC: 32.7 g/dL (ref 30.0–36.0)
MCV: 90.5 fL (ref 80.0–100.0)
Monocytes Absolute: 1.3 10*3/uL — ABNORMAL HIGH (ref 0.1–1.0)
Monocytes Relative: 8 %
Neutro Abs: 11.1 10*3/uL — ABNORMAL HIGH (ref 1.7–7.7)
Neutrophils Relative %: 72 %
Platelets: UNDETERMINED 10*3/uL (ref 150–400)
RBC: 4.02 MIL/uL (ref 3.87–5.11)
RDW: 16.7 % — ABNORMAL HIGH (ref 11.5–15.5)
Smear Review: UNDETERMINED
WBC: 15.1 10*3/uL — ABNORMAL HIGH (ref 4.0–10.5)
nRBC: 0 % (ref 0.0–0.2)

## 2021-07-28 LAB — URINE CULTURE: Culture: NO GROWTH

## 2021-07-28 LAB — RESP PANEL BY RT-PCR (FLU A&B, COVID) ARPGX2
Influenza A by PCR: NEGATIVE
Influenza B by PCR: NEGATIVE
SARS Coronavirus 2 by RT PCR: NEGATIVE

## 2021-07-28 LAB — MAGNESIUM: Magnesium: 1.9 mg/dL (ref 1.7–2.4)

## 2021-07-28 MED ORDER — POTASSIUM CHLORIDE CRYS ER 20 MEQ PO TBCR
40.0000 meq | EXTENDED_RELEASE_TABLET | ORAL | Status: AC
  Administered 2021-07-28 (×2): 40 meq via ORAL
  Filled 2021-07-28 (×2): qty 2

## 2021-07-28 NOTE — TOC Progression Note (Signed)
Transition of Care [facility]) - Progression Note    Patient Details  Name: Ariana [name] MRN: [id number] Date of Birth: June 18, 1933  Transition [facility]) CM/SW [place], [place] Phone Number: 07/28/2021, 5:44 PM  Clinical Narrative:     CSW spoke with [place] Admission/debbie- patient will need insurance authorization- she will start auth today  CSW unable to reach [place] - will call again tomorrow   Thurmond Butts, MSW, LCSW Clinical Social Worker    Expected Discharge Plan: [place] Barriers to Discharge: Continued Medical Work up  Expected Discharge Plan and Services Expected Discharge Plan: [place] In-house Referral: Clinical Social Work     Living arrangements for the past 2 months: [place]                                       Social Determinants of Health (SDOH) Interventions    Readmission Risk Interventions No flowsheet data found.

## 2021-07-28 NOTE — Progress Notes (Signed)
Physical Therapy Treatment Patient Details Name: Ariana [name] MRN: [id number] DOB: Nov 25, 1932 Today's Date: 07/28/2021   History of Present Illness 87 y.o. female admitted 10/10 due to fall at SNF - injury to the back of the head and R femoral neck fracture. Underwent ORIF 10/13.  CT head without contrast showed slight decreased size of the anterior left frontal intraparenchymal hematoma since prior study. PMH includes  ITP, asthma/COPD, Alzheimer disease with behavioral disturbances, gastroesophageal reflux disease, hypertension, hypercholesterolemia, hypothyroidism, chronic kidney disease stage IIIb Of note: Patient was recently admitted on 10/2-10/4 at [facility] due to subarachnoid hemorrhage sustained due to mechanical fall, neurosurgery was consulted at that time and patient did not require any intervention.    PT Comments    Pt pleasantly confused upon PT arrival, states "are you going to help me walk?". Pt requiring max assist for bed mobility and x1 transfer to standing, further standing or mobility OOB limited by severe R hip pain. Pt tolerated surgical hip exercises well at end of session, will continue to follow.     Recommendations for follow up therapy are one component of a multi-disciplinary discharge planning process, led by the attending physician.  Recommendations may be updated based on patient status, additional functional criteria and insurance authorization.  Follow Up Recommendations  SNF     Equipment Recommendations  Other (comment) (next venue)    Recommendations for Other Services       Precautions / Restrictions Precautions Precautions: Fall Restrictions Weight Bearing Restrictions: No RLE Weight Bearing: Weight bearing as tolerated     Mobility  Bed Mobility Overal bed mobility: Needs Assistance Bed Mobility: Supine to Sit;Sit to Supine     Supine to sit: Max assist Sit to supine: Max assist   General bed mobility comments: max assist for  trunk and LE management, especially RLE given pain with moving.    Transfers Overall transfer level: Needs assistance Equipment used: Rolling walker (2 wheeled) Transfers: Sit to/from Stand Sit to Stand: Max assist         General transfer comment: max assist for power up, hip extension to upright, and steadying pt/RW. Pt exclaiming in pain once standing, requesting sit back down after ~10 seconds.  Ambulation/Gait             General Gait Details: nt   Stairs             Wheelchair Mobility    Modified Rankin (Stroke Patients Only)       Balance Overall balance assessment: Needs assistance;History of Falls Sitting-balance support: No upper extremity supported;Feet supported Sitting balance-Leahy Scale: Poor Sitting balance - Comments: posterior leaning, requiring at least min PT assist to maintain upright sitting Postural control: Posterior lean                                  Cognition Arousal/Alertness: Awake/alert Behavior During Therapy: WFL for tasks assessed/performed Overall Cognitive Status: No family/caregiver present to determine baseline cognitive functioning                                 General Comments: history of dementia, not A&Ox0, pleasantly confused and follows commands inconsistently      Exercises General Exercises - Lower Extremity Ankle Circles/Pumps: AROM;Both;10 reps;Supine Short Arc Quad: AAROM;Right;10 reps;Left;5 reps;Supine Heel Slides: PROM;Right;10 reps;Supine Hip ABduction/ADduction: AAROM;Right;10 reps;Supine    General Comments  Pertinent Vitals/Pain Pain Assessment: Faces Faces Pain Scale: Hurts little more Pain Location: points to R hip, during mobility Pain Descriptors / Indicators: Grimacing;Guarding Pain Intervention(s): Limited activity within patient's tolerance;Monitored during session;Repositioned    Home Living                      Prior Function             PT Goals (current goals can now be found in the care plan section) Acute Rehab PT Goals Patient Stated Goal: did not state PT Goal Formulation: Patient unable to participate in goal setting Time For Goal Achievement: 08/08/21 Potential to Achieve Goals: Fair Progress towards PT goals: Progressing toward goals    Frequency    Min 3X/week      PT Plan Current plan remains appropriate    Co-evaluation              AM-PAC PT "6 Clicks" Mobility   Outcome Measure  Help needed turning from your back to your side while in a flat bed without using bedrails?: A Lot Help needed moving from lying on your back to sitting on the side of a flat bed without using bedrails?: A Lot Help needed moving to and from a bed to a chair (including a wheelchair)?: Total Help needed standing up from a chair using your arms (e.g., wheelchair or bedside chair)?: Total Help needed to walk in hospital room?: Total Help needed climbing 3-5 steps with a railing? : Total 6 Click Score: 8    End of Session Equipment Utilized During Treatment: Gait belt Activity Tolerance: Patient tolerated treatment well;Patient limited by fatigue Patient left: in bed;with call bell/phone within reach;with bed alarm set;Other (comment) (fall pad placed on R side of pt bed, tray table on L as only one fall pad present in room) Nurse Communication: Mobility status PT Visit Diagnosis: History of falling (Z91.81);Muscle weakness (generalized) (M62.81);Difficulty in walking, not elsewhere classified (R26.2)     Time: 5686-1683 PT Time Calculation (min) (ACUTE ONLY): 24 min  Charges:  $Therapeutic Exercise: 8-22 mins $Therapeutic Activity: 8-22 mins                     Stacie Glaze, PT DPT Acute Rehabilitation Services Pager [phone]  Office [phone]    [name] 07/28/2021, 12:28 PM

## 2021-07-28 NOTE — Progress Notes (Signed)
PROGRESS NOTE  Ariana [name] PJA:[id number] DOB: [birth year]   PCP: Caprice Renshaw, MD  Patient is from: Nursing home.  DOA: 07/21/2021 LOS: 7  Chief complaints:  Chief Complaint  Patient presents with   Fall    Pt is [place] and pt fell, found in floor with hematoma to back of head.  See and treated at [facility] last month for fall with brain bleed.  EMS reports that pt will not straight right leg, no rotation noted.  Pt is confused and her normal baseline.  VSS.         Brief Narrative / Interim history: 87 year old F with PMH of ITP, asthma/COPD, Alzheimer's dementia, SAH, hypothyroidism, HTN, HLD and GERD brought to [facility] ED by EMS with right hip pain and head pain after she sustained a fall, and found to have impacted subcapital right femoral neck fracture.  She was transferred to [facility] for surgical intervention.  CT head showed slightly decreased size of anterior left frontal intraparenchymal hematoma.  Oncology/hematology consulted about thrombocytopenia and recommended IVIG and Nplate that she received on 10/10 and 10/11 respectively.  Also received 1 unit of platelet.  Thrombocytopenia improved.  Underwent cannulated hip pinning by Dr. Erlinda Hong for right femoral neck fracture on 10/13.    Therapy recommends SNF.  Subjective: Seen and examined earlier this morning.  No major events overnight of this morning.  No complaints but not a reliable historian.  She responds no to pain.   Objective: Vitals:   07/27/21 2320 07/28/21 0510 07/28/21 0700 07/28/21 1100  BP: (!) 143/72 128/90 132/72   Pulse: 77 77    Resp:  15    Temp: 98.9 F (37.2 C) 98.7 F (37.1 C) 98.8 F (37.1 C) 98.4 F (36.9 C)  TempSrc:  Oral Oral   SpO2: 100% 98%    Weight:      Height:        Intake/Output Summary (Last 24 hours) at 07/28/2021 1313 Last data filed at 07/28/2021 0510 Gross per 24 hour  Intake 778.1 ml  Output 780 ml  Net -1.9 ml   Filed Weights   07/21/21 1502 07/24/21 1401  Weight:  60 kg 60 kg    Examination:  GENERAL: Frail looking elderly female.  No apparent distress.  Nontoxic. HEENT: MMM.  Vision and hearing grossly intact.  NECK: Supple.  No apparent JVD.  RESP: 98% on RA.  No IWOB.  Fair aeration bilaterally. CVS:  RRR. Heart sounds normal.  ABD/GI/GU: BS+. Abd soft, NTND.  MSK/EXT:  Moves extremities.  Significant muscle mass and subcu fat loss. SKIN: no apparent skin lesion or wound NEURO: Awake.  Only oriented to self.  Follows commands.  No apparent focal neuro deficit. PSYCH: Calm. Normal affect.   Procedures:  10/13-ORIF of proximal right femoral fracture by Dr. Erlinda Hong  Microbiology summarized: COVID-19 and influenza PCR nonreactive.  Assessment & Plan: Fall at nursing facility Impacted subcapital right femoral neck fracture likely due to fall -ORIF of proximal right femoral fracture by Dr. Erlinda Hong on 10/13. -Pain fairly controlled. -SCD for VTE prophylaxis in the setting of thrombocytopenia -Bowel regimen -PT/OT -Fall precaution   Thrombocytopenia/ITP-s/p IVIG and Nplate on 34/19/3790.  Had platelet transfusion on 10/12.  Not able to get platelet count due to clumping -Requested platelet count with citrate or other anticoagulant different from EDTA    History of subarachnoid hemorrhage-improved on CT head -No indication for NS intervention per Dr. Christella Noa with NS  CKD-3B: Ruled out.  Persistent leukocytosis/bandemia:  Seems to be persistent despite IV ceftriaxone.  Not febrile.  UA and urine culture negative for UTI.  She has no respiratory symptoms.  She has suprapubic tenderness that seems to have resolved -Complete IV ceftriaxone for 3 days empirically -Recheck in the morning  Hypokalemia: K3.4. -P.o. K-Dur 40 mill equivalent x2  Hyperglycemia: Resolved.  No history of DM.   Prolonged QTc (511 ms)-exaggerated by wide QRS due to LBBB.  Hypothyroidism -Continue Synthroid   Essential hypertension: Normotensive. -Continue  amlodipine  History of COPD/asthma: Stable -Continue home inhalers  Hyponatremia: Resolved with IV NS.  GERD -Continue Protonix  Alzheimer's dementia without behavioral disturbance: Only oriented to self. -Continue Namenda -Reorientation and delirium precautions  Anxiety: Stable. -Continue home Lexapro, Lamictal, gabapentin, BuSpar and Xanax  Normocytic anemia: H&H relatively stable after initial drop. Recent Labs    07/13/21 1011 07/15/21 0310 07/21/21 1851 07/22/21 0407 07/23/21 0356 07/24/21 0127 07/25/21 0335 07/26/21 0251 07/27/21 0627 07/28/21 0721  HGB 14.6 11.0* 13.0 11.6* 12.0 12.2 11.8* 11.6* 12.0 11.9*  -Monitor  Severe protein calorie malnutrition: Significant muscle mass and subcu fat loss. Body mass index is 20.72 kg/m. Nutrition Problem: Severe Malnutrition Etiology: chronic illness (Alzheimer's disease, COPD) Signs/Symptoms: severe muscle depletion, severe fat depletion Interventions: Ensure Enlive (each supplement provides 350kcal and 20 grams of protein), MVI, Magic cup, Refer to RD note for recommendations   DVT prophylaxis:  SCDs Start: 07/24/21 2052 Place TED hose Start: 07/24/21 2052 SCDs Start: 07/21/21 2316  Code Status: DNR/DNI Family Communication: Updated patient's cousin over the phone on 10/16. Level of care: Med-Surg.   Status is: Inpatient  Remains inpatient appropriate because:Unsafe d/c plan  Dispo: The patient is from:  Nursing home              Anticipated d/c is to:  SNF              Patient currently is medically stable to d/c.   Difficult to place patient No    Consultants:  Orthopedic surgery Oncology/hematology   Sch Meds:  Scheduled Meds:  sodium chloride   Intravenous Once   amLODipine  10 mg Oral Daily   Chlorhexidine Gluconate Cloth  6 each Topical Daily   feeding supplement  237 mL Oral BID BM   levothyroxine  50 mcg Oral QAC breakfast   mouth rinse  15 mL Mouth Rinse BID   memantine  10 mg Oral BID    multivitamin with minerals  1 tablet Oral Daily   pantoprazole  40 mg Oral Daily   rosuvastatin  5 mg Oral QPM   senna-docusate  1 tablet Oral Daily   Continuous Infusions:  cefTRIAXone (ROCEPHIN)  IV 1 g (07/28/21 1133)   methocarbamol (ROBAXIN) IV     PRN Meds:.acetaminophen, ALPRAZolam, alum & mag hydroxide-simeth, fentaNYL (SUBLIMAZE) injection, HYDROmorphone (DILAUDID) injection, menthol-cetylpyridinium **OR** phenol, methocarbamol **OR** methocarbamol (ROBAXIN) IV, ondansetron **OR** ondansetron (ZOFRAN) IV, oxyCODONE, oxyCODONE, polyethylene glycol, sorbitol  Antimicrobials: Anti-infectives (From admission, onward)    Start     Dose/Rate Route Frequency Ordered Stop   07/27/21 1145  cefTRIAXone (ROCEPHIN) 1 g in sodium chloride 0.9 % 100 mL IVPB        1 g 200 mL/hr over 30 Minutes Intravenous Every 24 hours 07/27/21 1052 08/01/21 1144   07/25/21 0600  ceFAZolin (ANCEF) IVPB 2g/100 mL premix        2 g 200 mL/hr over 30 Minutes Intravenous On call to O.R. 07/24/21 1340 07/24/21 1517   07/24/21 2145  ceFAZolin (ANCEF) IVPB 2g/100 mL premix        2 g 200 mL/hr over 30 Minutes Intravenous Every 6 hours 07/24/21 2051 07/25/21 0943   07/24/21 1313  ceFAZolin (ANCEF) 2-4 GM/100ML-% IVPB       Note to Pharmacy: Roosvelt Maser   : cabinet override      07/24/21 1313 07/24/21 1516        I have personally reviewed the following labs and images: CBC: Recent Labs  Lab 07/21/21 1851 07/22/21 0407 07/24/21 0127 07/25/21 0335 07/26/21 0251 07/27/21 0627 07/27/21 1000 07/28/21 0721  WBC 18.8*   < > 11.7* 15.0* 15.1* 15.9*  --  15.1*  NEUTROABS 15.0*  --   --   --   --  11.5*  --  11.1*  HGB 13.0   < > 12.2 11.8* 11.6* 12.0  --  11.9*  HCT 38.7   < > 36.2 35.1* 35.3* 35.5*  --  36.4  MCV 92.8   < > 90.0 89.5 90.7 90.1  --  90.5  PLT 12*   < > 68* 69* 65* PLATELET CLUMPS NOTED ON SMEAR, UNABLE TO ESTIMATE PLATELET CLUMPS NOTED ON SMEAR, UNABLE TO ESTIMATE PLATELET CLUMPS  NOTED ON SMEAR, UNABLE TO ESTIMATE   < > = values in this interval not displayed.   BMP &GFR Recent Labs  Lab 07/22/21 0407 07/23/21 0356 07/24/21 0318 07/25/21 0335 07/26/21 0251 07/28/21 0419  NA 136 132* 134* 135 136 137  K 3.8 4.3 3.6 3.6 3.7 3.4*  CL 104 102 104 104 105 105  CO2 27 23 21* 23 24 24   GLUCOSE 110* 93 85 81 95 107*  BUN 14 12 15 19 17 17   CREATININE 0.67 0.61 0.68 0.74 0.66 0.60  CALCIUM 8.5* 8.6* 8.7* 8.5* 8.8* 8.8*  MG 1.8  --  2.0 1.8 2.1 1.9  PHOS 2.7  --  3.4 3.6 3.0 3.1   Estimated Creatinine Clearance: 48.7 mL/min (by C-G formula based on SCr of 0.6 mg/dL). Liver & Pancreas: Recent Labs  Lab 07/21/21 1851 07/22/21 0407 07/24/21 0318 07/25/21 0335 07/26/21 0251 07/28/21 0419  AST 25 20  --   --   --   --   ALT 24 19  --   --   --   --   ALKPHOS 106 89  --   --   --   --   BILITOT 0.7 0.8  --   --   --   --   PROT 7.0 8.1  --   --   --   --   ALBUMIN 3.7 3.3* 2.8* 2.7* 2.8* 2.7*   No results for input(s): LIPASE, AMYLASE in the last 168 hours. No results for input(s): AMMONIA in the last 168 hours. Diabetic: No results for input(s): HGBA1C in the last 72 hours. No results for input(s): GLUCAP in the last 168 hours. Cardiac Enzymes: Recent Labs  Lab 07/24/21 0318  CKTOTAL 28*   No results for input(s): PROBNP in the last 8760 hours. Coagulation Profile: Recent Labs  Lab 07/22/21 0407  INR 1.0   Thyroid Function Tests: No results for input(s): TSH, T4TOTAL, FREET4, T3FREE, THYROIDAB in the last 72 hours. Lipid Profile: No results for input(s): CHOL, HDL, LDLCALC, TRIG, CHOLHDL, LDLDIRECT in the last 72 hours. Anemia Panel: No results for input(s): VITAMINB12, FOLATE, FERRITIN, TIBC, IRON, RETICCTPCT in the last 72 hours. Urine analysis:    Component Value Date/Time   COLORURINE YELLOW 07/27/2021 0729  APPEARANCEUR HAZY (A) 07/27/2021 0729   APPEARANCEUR Clear 09/03/2014 1200   LABSPEC 1.019 07/27/2021 0729   LABSPEC 1.018  09/03/2014 1200   PHURINE 5.0 07/27/2021 0729   GLUCOSEU NEGATIVE 07/27/2021 0729   GLUCOSEU Negative 09/03/2014 1200   HGBUR NEGATIVE 07/27/2021 0729   BILIRUBINUR NEGATIVE 07/27/2021 0729   BILIRUBINUR Negative 09/03/2014 1200   KETONESUR 20 (A) 07/27/2021 0729   PROTEINUR NEGATIVE 07/27/2021 0729   NITRITE NEGATIVE 07/27/2021 0729   LEUKOCYTESUR NEGATIVE 07/27/2021 0729   LEUKOCYTESUR Negative 09/03/2014 1200   Sepsis Labs: Invalid input(s): PROCALCITONIN, Bright  Microbiology: Recent Results (from the past 240 hour(s))  Resp Panel by RT-PCR (Flu A&B, Covid) Nasopharyngeal Swab     Status: None   Collection Time: 07/21/21  7:54 PM   Specimen: Nasopharyngeal Swab; Nasopharyngeal(NP) swabs in vial transport medium  Result Value Ref Range Status   SARS Coronavirus 2 by RT PCR NEGATIVE NEGATIVE Final    Comment: (NOTE) SARS-CoV-2 target nucleic acids are NOT DETECTED.  The SARS-CoV-2 RNA is generally detectable in upper respiratory specimens during the acute phase of infection. The lowest concentration of SARS-CoV-2 viral copies this assay can detect is 138 copies/mL. A negative result does not preclude SARS-Cov-2 infection and should not be used as the sole basis for treatment or other patient management decisions. A negative result may occur with  improper specimen collection/handling, submission of specimen other than nasopharyngeal swab, presence of viral mutation(s) within the areas targeted by this assay, and inadequate number of viral copies(<138 copies/mL). A negative result must be combined with clinical observations, patient history, and epidemiological information. The expected result is Negative.  Fact Sheet for Patients:  EntrepreneurPulse.com.au  Fact Sheet for Healthcare Providers:  IncredibleEmployment.be  This test is no t yet approved or cleared by the [place] FDA and  has been authorized for detection and/or  diagnosis of SARS-CoV-2 by FDA under an Emergency Use Authorization (EUA). This EUA will remain  in effect (meaning this test can be used) for the duration of the COVID-19 declaration under Section 564(b)(1) of the Act, 21 U.S.C.section 360bbb-3(b)(1), unless the authorization is terminated  or revoked sooner.       Influenza A by PCR NEGATIVE NEGATIVE Final   Influenza B by PCR NEGATIVE NEGATIVE Final    Comment: (NOTE) The Xpert Xpress SARS-CoV-2/FLU/RSV plus assay is intended as an aid in the diagnosis of influenza from Nasopharyngeal swab specimens and should not be used as a sole basis for treatment. Nasal washings and aspirates are unacceptable for Xpert Xpress SARS-CoV-2/FLU/RSV testing.  Fact Sheet for Patients: EntrepreneurPulse.com.au  Fact Sheet for Healthcare Providers: IncredibleEmployment.be  This test is not yet approved or cleared by the [place] FDA and has been authorized for detection and/or diagnosis of SARS-CoV-2 by FDA under an Emergency Use Authorization (EUA). This EUA will remain in effect (meaning this test can be used) for the duration of the COVID-19 declaration under Section 564(b)(1) of the Act, 21 U.S.C. section 360bbb-3(b)(1), unless the authorization is terminated or revoked.  Performed at [facility], [address], [place], [place] [address]   Urine Culture     Status: None   Collection Time: 07/27/21  3:35 PM   Specimen: Urine, Catheterized  Result Value Ref Range Status   Specimen Description URINE, CATHETERIZED  Final   Special Requests NONE  Final   Culture   Final    NO GROWTH Performed at [facility], [address], [place], [place] [address]  Report Status 07/28/2021 FINAL  Final    Radiology Studies: No results found.    Shaya Reddick T. [place]  If 7PM-7AM, please contact night-coverage [email or web address] 07/28/2021, 1:13 PM

## 2021-07-29 LAB — CBC
HCT: 39.4 % (ref 36.0–46.0)
Hemoglobin: 12.8 g/dL (ref 12.0–15.0)
MCH: 30 pg (ref 26.0–34.0)
MCHC: 32.5 g/dL (ref 30.0–36.0)
MCV: 92.5 fL (ref 80.0–100.0)
Platelets: UNDETERMINED 10*3/uL (ref 150–400)
RBC: 4.26 MIL/uL (ref 3.87–5.11)
RDW: 17.7 % — ABNORMAL HIGH (ref 11.5–15.5)
WBC: 16.4 10*3/uL — ABNORMAL HIGH (ref 4.0–10.5)
nRBC: 0 % (ref 0.0–0.2)

## 2021-07-29 LAB — RENAL FUNCTION PANEL
Albumin: 3 g/dL — ABNORMAL LOW (ref 3.5–5.0)
Anion gap: 8 (ref 5–15)
BUN: 19 mg/dL (ref 8–23)
CO2: 24 mmol/L (ref 22–32)
Calcium: 9.3 mg/dL (ref 8.9–10.3)
Chloride: 109 mmol/L (ref 98–111)
Creatinine, Ser: 0.67 mg/dL (ref 0.44–1.00)
GFR, Estimated: >60 mL/min (ref >60)
Glucose, Bld: 99 mg/dL (ref 70–99)
Phosphorus: 3.8 mg/dL (ref 2.5–4.6)
Potassium: 4.2 mmol/L (ref 3.5–5.1)
Sodium: 141 mmol/L (ref 135–145)

## 2021-07-29 LAB — MAGNESIUM: Magnesium: 2.2 mg/dL (ref 1.7–2.4)

## 2021-07-29 NOTE — TOC Progression Note (Signed)
Transition of Care [facility]) - Progression Note    Patient Details  Name: Ariana [name] MRN: [id number] Date of Birth: August 05, 1933  Transition of Care [facility]) CM/SW [place], [place] Phone Number: 07/29/2021, 11:07 AM  Clinical Narrative:     Left voice message DSS SW  Rico Junker - supervisor @ [phone] [place] remains pending  CSW will continue to follow and assist with discharge planning.  Thurmond Butts, MSW, LCSW Clinical Social Worker    Expected Discharge Plan: Skilled Nursing Facility Barriers to Discharge: Continued Medical Work up  Expected Discharge Plan and Services Expected Discharge Plan: [place] In-house Referral: Clinical Social Work     Living arrangements for the past 2 months: [place]                                       Social Determinants of Health (SDOH) Interventions    Readmission Risk Interventions No flowsheet data found.

## 2021-07-29 NOTE — TOC Progression Note (Signed)
Transition of Care [facility]) - Progression Note    Patient Details  Name: Zahria Ding MRN: [id number] Date of Birth: 11-Nov-1932  Transition of Care [facility]) CM/SW [place], [place] Phone Number: 07/29/2021, 8:44 AM  Clinical Narrative:     Called patient's DSS Guardian Ms Rico Junker- left voice message to return  call.  Thurmond Butts, MSW, LCSW Clinical Social Worker    Expected Discharge Plan: Skilled Nursing Facility Barriers to Discharge: Continued Medical Work up  Expected Discharge Plan and Services Expected Discharge Plan: [place] In-house Referral: Clinical Social Work     Living arrangements for the past 2 months: [place]                                       Social Determinants of Health (SDOH) Interventions    Readmission Risk Interventions No flowsheet data found.

## 2021-07-29 NOTE — Anesthesia Postprocedure Evaluation (Addendum)
Anesthesia Post Note  Patient: Ariana [name]  Procedure(s) Performed: CANNULATED HIP PINNING (Right: Hip)     Patient location during evaluation: PACU Anesthesia Type: General Level of consciousness: awake and alert Pain management: pain level controlled Vital Signs Assessment: post-procedure vital signs reviewed and stable Respiratory status: spontaneous breathing, nonlabored ventilation, respiratory function stable and patient connected to nasal cannula oxygen Cardiovascular status: blood pressure returned to baseline and stable Postop Assessment: no apparent nausea or vomiting Anesthetic complications: no   No notable events documented.  Last Vitals:  Vitals:   07/29/21 0451 07/29/21 0720  BP: 133/78 132/72  Pulse: 81   Resp: 19   Temp: (!) 36.3 C 36.7 C  SpO2: 97%     Last Pain:  Vitals:   07/29/21 0720  TempSrc: Oral  PainSc:                  Loyde Orth

## 2021-07-29 NOTE — Progress Notes (Signed)
Nutrition Follow-up  DOCUMENTATION CODES:   Severe malnutrition in context of chronic illness  INTERVENTION:   - Ensure Enlive po BID, each supplement provides 350 kcal and 20 grams of protein   - Magic Cup TID with meals, each supplement provides 290 kcal and 9 grams of protein   - MVI with minerals daily   - Recommend feeding assistance with meals and encourage PO intake  NUTRITION DIAGNOSIS:   Severe Malnutrition related to chronic illness (Alzheimer's disease, COPD) as evidenced by severe muscle depletion, severe fat depletion.  Ongoing, being addressed with oral nutrition supplements  GOAL:   Patient will meet greater than or equal to 90% of their needs  Progressing  MONITOR:   PO intake, Supplement acceptance, Labs, Weight trends, I & O's  REASON FOR ASSESSMENT:   Malnutrition Screening Tool    ASSESSMENT:   87 year old female who presented to the ED on 10/10 after a fall at SNF. PMH of ITP, asthma/COPD, Alzheimer's disease with behavioral disturbances, GERD, HTN, HLD, CKD stage IIIb. Pt found to have R femoral neck fx, thrombocytopenia.  10/13 - s/p R cannulated hip pinning  Spoke with pt at bedside. Pt with history of Alzheimer's disease and unable to provide RD with much information. Pt states that she did not eat breakfast. Unsure if this is accurate. Pt with ~90% completed Ensure supplement in a cup in front of her. RD encouraged pt to continue consuming Ensure supplements. Pt states that she likes them. RD has also ordered Magic Cups with meals.  Meal completions have been variable. Suspect that this it pt's baseline. Will continue with current oral nutrition supplement regimen at this time.  Meal Completion: 0-85% x last 8 documented meals  Medications reviewed and include: Ensure Enlive BID, MVI with minerals, protonix, senna, IV abx  Labs reviewed.  UOP: 600 ml x 24 hours  Diet Order:   Diet Order             Diet regular Room service  appropriate? Yes with Assist; Fluid consistency: Thin  Diet effective now                   EDUCATION NEEDS:   Not appropriate for education at this time  Skin:  Skin Assessment: Skin Integrity Issues: Incisions: R hip  Last BM:  07/29/21 smear type 6  Height:   Ht Readings from Last 1 Encounters:  07/24/21 5\' 7"  (1.702 m)    Weight:   Wt Readings from Last 1 Encounters:  07/24/21 60 kg    BMI:  Body mass index is 20.72 kg/m.  Estimated Nutritional Needs:   Kcal:  1450-1650  Protein:  75-90 grams  Fluid:  1.4-1.6 L    Gustavus Bryant, MS, RD, LDN Inpatient Clinical Dietitian Please see AMiON for contact information.

## 2021-07-29 NOTE — Progress Notes (Signed)
Occupational Therapy Treatment Patient Details Name: Ariana [name] MRN: [id number] DOB: [birth year] Today's Date: 07/29/2021   History of present illness 87 y.o. female admitted 10/10 due to fall at [facility] - injury to the back of the head and R femoral neck fracture. Underwent ORIF 10/13.  CT head without contrast showed slight decreased size of the anterior left frontal intraparenchymal hematoma since prior study. PMH includes  ITP, asthma/COPD, Alzheimer disease with behavioral disturbances, gastroesophageal reflux disease, hypertension, hypercholesterolemia, hypothyroidism, chronic kidney disease stage IIIb Of note: Patient was recently admitted on 10/2-10/4 at [facility] due to subarachnoid hemorrhage sustained due to mechanical fall, neurosurgery was consulted at that time and patient did not require any intervention.   OT comments  Pt making steady progress toward goals. Able to stand with Max A +2 but unable to achieve full upright posture. Music incorporated into session. Will benefit from rehab at [facility].    Recommendations for follow up therapy are one component of a multi-disciplinary discharge planning process, led by the attending physician.  Recommendations may be updated based on patient status, additional functional criteria and insurance authorization.    Follow Up Recommendations  SNF    Equipment Recommendations  None recommended by OT    Recommendations for Other Services      Precautions / Restrictions Precautions Precautions: Fall Restrictions RLE Weight Bearing: Weight bearing as tolerated       Mobility Bed Mobility Overal bed mobility: Needs Assistance       Supine to sit: Max assist;+2 for physical assistance Sit to supine: Max assist;+2 for physical assistance   General bed mobility comments: Pt begins to initiate but is limited by pain and becomes distracted    Transfers Overall transfer level: Needs assistance     Sit to Stand: Max assist;+2  physical assistance              Balance     Sitting balance-Leahy Scale: Poor Sitting balance - Comments: posterior leaning, requiring at least min PT assist to maintain upright sitting                                   ADL either performed or assessed with clinical judgement   ADL                                       Functional mobility during ADLs: Maximal assistance;+2 for physical assistance General ADL Comments: Sat EOB to participate in grooming; rubbed lotion on BLE to facilitate forard/anterior lean as pt tends to push posteriorly     Vision       Perception     Praxis      Cognition Arousal/Alertness: Awake/alert Behavior During Therapy: WFL for tasks assessed/performed Overall Cognitive Status: No family/caregiver present to determine baseline cognitive functioning (hx of dementia; able to follow commands and participate with therapy)                                          Exercises     Shoulder Instructions       General Comments Left music on . Pt likes "Elvis"    Pertinent Vitals/ Pain       Pain Assessment: Faces Faces Pain Scale: Hurts  even more Pain Location: r hip Pain Descriptors / Indicators: Grimacing;Guarding Pain Intervention(s): Limited activity within patient's tolerance  Home Living                                          Prior Functioning/Environment              Frequency  Min 2X/week        Progress Toward Goals  OT Goals(current goals can now be found in the care plan section)  Progress towards OT goals: Progressing toward goals  Acute Rehab OT Goals Patient Stated Goal: did not state OT Goal Formulation: Patient unable to participate in goal setting Time For Goal Achievement: 08/08/21 Potential to Achieve Goals: Good ADL Goals Pt Will Perform Upper Body Bathing: with set-up;with supervision;sitting Pt Will Perform Lower Body Bathing:  with min assist;sit to/from stand Pt Will Perform Lower Body Dressing: with mod assist;sit to/from stand Pt Will Transfer to Toilet: with min guard assist;bedside commode  Plan Discharge plan remains appropriate    Co-evaluation    PT/OT/SLP Co-Evaluation/Treatment: Yes Reason for Co-Treatment: Necessary to address cognition/behavior during functional activity;For patient/therapist safety;To address functional/ADL transfers   OT goals addressed during session: ADL's and self-care      AM-PAC OT "6 Clicks" Daily Activity     Outcome Measure   Help from another person eating meals?: A Little Help from another person taking care of personal grooming?: A Little Help from another person toileting, which includes using toliet, bedpan, or urinal?: Total Help from another person bathing (including washing, rinsing, drying)?: A Lot Help from another person to put on and taking off regular upper body clothing?: A Lot Help from another person to put on and taking off regular lower body clothing?: Total 6 Click Score: 12    End of Session Equipment Utilized During Treatment: Gait belt;Rolling walker  OT Visit Diagnosis: Unsteadiness on feet (R26.81);Other abnormalities of gait and mobility (R26.89);Muscle weakness (generalized) (M62.81);History of falling (Z91.81);Other symptoms and signs involving cognitive function;Pain;Dizziness and giddiness (R42) Pain - Right/Left: Right Pain - part of body: Hip;Leg   Activity Tolerance Patient tolerated treatment well   Patient Left in bed;with call bell/phone within reach;with bed alarm set;Other (comment) (chair position)   Nurse Communication Mobility status;Need for lift equipment Charlaine Dalton)        Time: 9450-3888 OT Time Calculation (min): 27 min  Charges: OT General Charges $OT Visit: 1 Visit OT Treatments $Self Care/Home Management : 8-22 mins  Maurie Boettcher, OT/L   Acute OT Clinical Specialist [place] Pager  [phone] Office [phone]   [facility] 07/29/2021, 10:40 AM

## 2021-07-29 NOTE — Progress Notes (Signed)
Physical Therapy Treatment Patient Details Name: Ariana [name] MRN: [id number] DOB: [birth year] Today's Date: 07/29/2021   History of Present Illness 87 y.o. female admitted 10/10 due to fall at SNF - injury to the back of the head and R femoral neck fracture. Underwent ORIF 10/13.  CT head without contrast showed slight decreased size of the anterior left frontal intraparenchymal hematoma since prior study. PMH includes  ITP, asthma/COPD, Alzheimer disease with behavioral disturbances, gastroesophageal reflux disease, hypertension, hypercholesterolemia, hypothyroidism, chronic kidney disease stage IIIb Of note: Patient was recently admitted on 10/2-10/4 at [facility] due to subarachnoid hemorrhage sustained due to mechanical fall, neurosurgery was consulted at that time and patient did not require any intervention.    PT Comments    Pt pleasant and cooperative during session, oriented to self only. PT and OT saw pt together to attempt to progress pt mobility, pt still requiring max +2 assist for bed mobility and repeated transfers to standing. Pt unable to take steps today secondary to severe R hip pain dynamically. Pt tolerated EOB sitting x15 minutes while performing LE ADLs with intermittent min truncal assist to correct posterior leaning. PT to continue to follow.    Recommendations for follow up therapy are one component of a multi-disciplinary discharge planning process, led by the attending physician.  Recommendations may be updated based on patient status, additional functional criteria and insurance authorization.  Follow Up Recommendations  SNF     Equipment Recommendations  Other (comment) (next venue)    Recommendations for Other Services       Precautions / Restrictions Precautions Precautions: Fall Restrictions Weight Bearing Restrictions: No RLE Weight Bearing: Weight bearing as tolerated     Mobility  Bed Mobility Overal bed mobility: Needs Assistance        Supine to sit: Max assist;+2 for physical assistance Sit to supine: Max assist;+2 for physical assistance   General bed mobility comments: max assist for trunk and LE management, scooting to/from EOB, boost up in bed upon return to supine. Pt initiates trunk elevation via HHA and trunk lowering.    Transfers Overall transfer level: Needs assistance Equipment used: 2 person hand held assist   Sit to Stand: Max assist;+2 physical assistance         General transfer comment: max +2 for power up, hip extension via posterior facilitation, completion of rise to standing. STS x2, from EOB. Attempted to have pt laterally step towards PT, pt unable and promptly sat back down.  Ambulation/Gait             General Gait Details: unable   Stairs             Wheelchair Mobility    Modified Rankin (Stroke Patients Only)       Balance Overall balance assessment: Needs assistance;History of Falls Sitting-balance support: No upper extremity supported;Feet supported Sitting balance-Leahy Scale: Poor Sitting balance - Comments: posterior leaning, requiring at least min assist to maintain upright sitting Postural control: Posterior lean Standing balance support: Bilateral upper extremity supported Standing balance-Leahy Scale: Zero Standing balance comment: max +2                            Cognition Arousal/Alertness: Awake/alert Behavior During Therapy: WFL for tasks assessed/performed Overall Cognitive Status: No family/caregiver present to determine baseline cognitive functioning  General Comments: hx of dementia; able to follow commands and participate with therapy      Exercises      General Comments General comments (skin integrity, edema, etc.): Left music on . Pt likes "Elvis"      Pertinent Vitals/Pain Pain Assessment: Faces Faces Pain Scale: Hurts even more Pain Location: r hip Pain Descriptors /  Indicators: Grimacing;Guarding Pain Intervention(s): Limited activity within patient's tolerance;Monitored during session;Repositioned    Home Living                      Prior Function            PT Goals (current goals can now be found in the care plan section) Acute Rehab PT Goals Patient Stated Goal: did not state PT Goal Formulation: Patient unable to participate in goal setting Time For Goal Achievement: 08/08/21 Potential to Achieve Goals: Fair Progress towards PT goals: Progressing toward goals    Frequency    Min 3X/week      PT Plan Current plan remains appropriate    Co-evaluation PT/OT/SLP Co-Evaluation/Treatment: Yes Reason for Co-Treatment: Necessary to address cognition/behavior during functional activity;For patient/therapist safety;To address functional/ADL transfers PT goals addressed during session: Mobility/safety with mobility;Balance OT goals addressed during session: ADL's and self-care      AM-PAC PT "6 Clicks" Mobility   Outcome Measure  Help needed turning from your back to your side while in a flat bed without using bedrails?: A Lot Help needed moving from lying on your back to sitting on the side of a flat bed without using bedrails?: A Lot Help needed moving to and from a bed to a chair (including a wheelchair)?: Total Help needed standing up from a chair using your arms (e.g., wheelchair or bedside chair)?: Total Help needed to walk in hospital room?: Total Help needed climbing 3-5 steps with a railing? : Total 6 Click Score: 8    End of Session Equipment Utilized During Treatment: Gait belt Activity Tolerance: Patient tolerated treatment well;Patient limited by fatigue Patient left: in bed;with call bell/phone within reach;with bed alarm set;Other (comment) (fall pad placed on R side of pt bed, tray table on L as only one fall pad present in room) Nurse Communication: Mobility status PT Visit Diagnosis: History of falling  (Z91.81);Muscle weakness (generalized) (M62.81);Difficulty in walking, not elsewhere classified (R26.2)     Time: 7824-2353 PT Time Calculation (min) (ACUTE ONLY): 27 min  Charges:  $Therapeutic Activity: 8-22 mins                    Stacie Glaze, PT DPT Acute Rehabilitation Services Pager [phone]  Office [phone]    [place] Ruffin Pyo 07/29/2021, 11:56 AM

## 2021-07-29 NOTE — Progress Notes (Signed)
PROGRESS NOTE  Ariana [name] KPT:[id number] DOB: Jan 02, 1933   PCP: Caprice Renshaw, MD  Patient is from: Nursing home.  DOA: 07/21/2021 LOS: 8  Chief complaints:  Chief Complaint  Patient presents with   Fall    Pt is [place] and pt fell, found in floor with hematoma to back of head.  See and treated at [facility] last month for fall with brain bleed.  EMS reports that pt will not straight right leg, no rotation noted.  Pt is confused and her normal baseline.  VSS.         Brief Narrative / Interim history: 87 year old F with PMH of ITP, asthma/COPD, Alzheimer's dementia, SAH, hypothyroidism, HTN, HLD and GERD brought to [facility] ED by EMS with right hip pain and head pain after she sustained a fall, and found to have impacted subcapital right femoral neck fracture.  She was transferred to [facility] for surgical intervention.  CT head showed slightly decreased size of anterior left frontal intraparenchymal hematoma.  Oncology/hematology consulted about thrombocytopenia and recommended IVIG and Nplate that she received on 10/10 and 10/11 respectively.  Also received 1 unit of platelet.  Thrombocytopenia improved.  Underwent cannulated hip pinning by Dr. Erlinda Hong for right femoral neck fracture on 10/13.    Medically stable for discharge to SNF pending insurance authorization.  Subjective: Seen and examined earlier this morning.  No major events overnight of this morning.  No complaints but not a reliable historian.  She responds no to pain.  She is only oriented to self.  Does not appear to be in distress.  Objective: Vitals:   07/28/21 2100 07/28/21 2355 07/29/21 0451 07/29/21 0720  BP: (!) 125/94 (!) 152/89 133/78 132/72  Pulse:  79 81   Resp: (!) 23 20 19    Temp:  98.6 F (37 C) (!) 97.4 F (36.3 C) 98 F (36.7 C)  TempSrc:  Oral Axillary Oral  SpO2:  99% 97%   Weight:      Height:        Intake/Output Summary (Last 24 hours) at 07/29/2021 1423 Last data filed at 07/29/2021  0451 Gross per 24 hour  Intake 120 ml  Output 400 ml  Net -280 ml   Filed Weights   07/21/21 1502 07/24/21 1401  Weight: 60 kg 60 kg    Examination:  GENERAL: Frail looking elderly female.  No apparent distress.  Nontoxic. HEENT: MMM.  Vision and hearing grossly intact.  NECK: Supple.  No apparent JVD.  RESP: 97% on RA.  No IWOB.  Fair aeration bilaterally. CVS:  RRR. Heart sounds normal.  ABD/GI/GU: BS+. Abd soft, NTND.  MSK/EXT:  Moves extremities.  Significant muscle mass and subcu fat loss. SKIN: no apparent skin lesion or wound NEURO: Awake and alert.  Oriented only to self.  Follows commands.  No apparent focal neuro deficit. PSYCH: Calm. Normal affect.   Procedures:  10/13-ORIF of proximal right femoral fracture by Dr. Erlinda Hong  Microbiology summarized: COVID-19 and influenza PCR nonreactive.  Assessment & Plan: Fall at nursing facility Impacted subcapital right femoral neck fracture likely due to fall -ORIF of proximal right femoral fracture by Dr. Erlinda Hong on 10/13. -Pain fairly controlled. -SCD for VTE prophylaxis in the setting of thrombocytopenia -Bowel regimen -PT/OT -Fall precaution   Thrombocytopenia/ITP-s/p IVIG and Nplate on 17/00/1749.  Had platelet transfusion on 10/12.  Not able to get platelet count due to clumping.  Not able to order platelets in citrate in epic -Ordered platelet count and talked to  lab to perform platelet count in citrate.    History of subarachnoid hemorrhage-improved on CT head -No indication for NS intervention per Dr. Christella Noa with NS  CKD-3B: Ruled out.  Persistent leukocytosis/bandemia: Persistent despite IV ceftriaxone.  Not febrile.  UA and urine culture negative for UTI.  No signs of respiratory, GI or other infection.  Suprapubic tenderness resolved.  Hematologic disorder? -Received Ancef perioperatively and CTX 10/16-10/18. -Recheck in the morning -Outpatient follow-up with hematology/oncology  Hypokalemia:  Resolved.  Hyperglycemia: Resolved.  No history of DM.   Prolonged QTc (511 ms)-exaggerated by wide QRS due to LBBB.  Hypothyroidism -Continue Synthroid   Essential hypertension: Normotensive. -Continue amlodipine  History of COPD/asthma: Stable -Continue home inhalers  Hyponatremia: Resolved   GERD -Continue Protonix  Alzheimer's dementia without behavioral disturbance: Only oriented to self. -Continue Namenda -Reorientation and delirium precautions  Anxiety: Stable. -Continue home Lexapro, Lamictal, gabapentin, BuSpar and Xanax  Normocytic anemia: H&H relatively stable after initial drop. Recent Labs    07/15/21 0310 07/21/21 1851 07/22/21 0407 07/23/21 0356 07/24/21 0127 07/25/21 0335 07/26/21 0251 07/27/21 0627 07/28/21 0721 07/29/21 0326  HGB 11.0* 13.0 11.6* 12.0 12.2 11.8* 11.6* 12.0 11.9* 12.8  -Monitor  Severe protein calorie malnutrition: Significant muscle mass and subcu fat loss. Body mass index is 20.72 kg/m. Nutrition Problem: Severe Malnutrition Etiology: chronic illness (Alzheimer's disease, COPD) Signs/Symptoms: severe muscle depletion, severe fat depletion Interventions: Ensure Enlive (each supplement provides 350kcal and 20 grams of protein), MVI, Magic cup, Refer to RD note for recommendations   DVT prophylaxis:  SCDs Start: 07/24/21 2052 Place TED hose Start: 07/24/21 2052 SCDs Start: 07/21/21 2316  Code Status: DNR/DNI Family Communication: Updated patient's cousin over the phone on 10/16. Level of care: Med-Surg.   Status is: Inpatient  Remains inpatient appropriate because:Unsafe d/c plan pending insurance authorization for SNF  Dispo: The patient is from:  Nursing home              Anticipated d/c is to:  SNF              Patient currently is medically stable to d/c.   Difficult to place patient No    Consultants:  Orthopedic surgery Oncology/hematology   Sch Meds:  Scheduled Meds:  sodium chloride   Intravenous  Once   amLODipine  10 mg Oral Daily   Chlorhexidine Gluconate Cloth  6 each Topical Daily   feeding supplement  237 mL Oral BID BM   levothyroxine  50 mcg Oral QAC breakfast   mouth rinse  15 mL Mouth Rinse BID   memantine  10 mg Oral BID   multivitamin with minerals  1 tablet Oral Daily   pantoprazole  40 mg Oral Daily   rosuvastatin  5 mg Oral QPM   senna-docusate  1 tablet Oral Daily   Continuous Infusions:  cefTRIAXone (ROCEPHIN)  IV 1 g (07/29/21 1038)   methocarbamol (ROBAXIN) IV     PRN Meds:.acetaminophen, ALPRAZolam, alum & mag hydroxide-simeth, fentaNYL (SUBLIMAZE) injection, HYDROmorphone (DILAUDID) injection, menthol-cetylpyridinium **OR** phenol, methocarbamol **OR** methocarbamol (ROBAXIN) IV, ondansetron **OR** ondansetron (ZOFRAN) IV, oxyCODONE, oxyCODONE, polyethylene glycol, sorbitol  Antimicrobials: Anti-infectives (From admission, onward)    Start     Dose/Rate Route Frequency Ordered Stop   07/27/21 1145  cefTRIAXone (ROCEPHIN) 1 g in sodium chloride 0.9 % 100 mL IVPB        1 g 200 mL/hr over 30 Minutes Intravenous Every 24 hours 07/27/21 1052 08/01/21 1144   07/25/21 0600  ceFAZolin (ANCEF)  IVPB 2g/100 mL premix        2 g 200 mL/hr over 30 Minutes Intravenous On call to O.R. 07/24/21 1340 07/24/21 1517   07/24/21 2145  ceFAZolin (ANCEF) IVPB 2g/100 mL premix        2 g 200 mL/hr over 30 Minutes Intravenous Every 6 hours 07/24/21 2051 07/25/21 0943   07/24/21 1313  ceFAZolin (ANCEF) 2-4 GM/100ML-% IVPB       Note to Pharmacy: Roosvelt Maser   : cabinet override      07/24/21 1313 07/24/21 1516        I have personally reviewed the following labs and images: CBC: Recent Labs  Lab 07/25/21 0335 07/26/21 0251 07/27/21 0627 07/27/21 1000 07/28/21 0721 07/29/21 0326  WBC 15.0* 15.1* 15.9*  --  15.1* 16.4*  NEUTROABS  --   --  11.5*  --  11.1*  --   HGB 11.8* 11.6* 12.0  --  11.9* 12.8  HCT 35.1* 35.3* 35.5*  --  36.4 39.4  MCV 89.5 90.7 90.1  --   90.5 92.5  PLT 69* 65* PLATELET CLUMPS NOTED ON SMEAR, UNABLE TO ESTIMATE PLATELET CLUMPS NOTED ON SMEAR, UNABLE TO ESTIMATE PLATELET CLUMPS NOTED ON SMEAR, UNABLE TO ESTIMATE PLATELET CLUMPS NOTED ON SMEAR, UNABLE TO ESTIMATE   BMP &GFR Recent Labs  Lab 07/24/21 0318 07/25/21 0335 07/26/21 0251 07/28/21 0419 07/29/21 0326  NA 134* 135 136 137 141  K 3.6 3.6 3.7 3.4* 4.2  CL 104 104 105 105 109  CO2 21* 23 24 24 24   GLUCOSE 85 81 95 107* 99  BUN 15 19 17 17 19   CREATININE 0.68 0.74 0.66 0.60 0.67  CALCIUM 8.7* 8.5* 8.8* 8.8* 9.3  MG 2.0 1.8 2.1 1.9 2.2  PHOS 3.4 3.6 3.0 3.1 3.8   Estimated Creatinine Clearance: 48.7 mL/min (by C-G formula based on SCr of 0.67 mg/dL). Liver & Pancreas: Recent Labs  Lab 07/24/21 0318 07/25/21 0335 07/26/21 0251 07/28/21 0419 07/29/21 0326  ALBUMIN 2.8* 2.7* 2.8* 2.7* 3.0*   No results for input(s): LIPASE, AMYLASE in the last 168 hours. No results for input(s): AMMONIA in the last 168 hours. Diabetic: No results for input(s): HGBA1C in the last 72 hours. No results for input(s): GLUCAP in the last 168 hours. Cardiac Enzymes: Recent Labs  Lab 07/24/21 0318  CKTOTAL 28*   No results for input(s): PROBNP in the last 8760 hours. Coagulation Profile: No results for input(s): INR, PROTIME in the last 168 hours.  Thyroid Function Tests: No results for input(s): TSH, T4TOTAL, FREET4, T3FREE, THYROIDAB in the last 72 hours. Lipid Profile: No results for input(s): CHOL, HDL, LDLCALC, TRIG, CHOLHDL, LDLDIRECT in the last 72 hours. Anemia Panel: No results for input(s): VITAMINB12, FOLATE, FERRITIN, TIBC, IRON, RETICCTPCT in the last 72 hours. Urine analysis:    Component Value Date/Time   COLORURINE YELLOW 07/27/2021 0729   APPEARANCEUR HAZY (A) 07/27/2021 0729   APPEARANCEUR Clear 09/03/2014 1200   LABSPEC 1.019 07/27/2021 0729   LABSPEC 1.018 09/03/2014 1200   PHURINE 5.0 07/27/2021 0729   GLUCOSEU NEGATIVE 07/27/2021 0729    GLUCOSEU Negative 09/03/2014 1200   HGBUR NEGATIVE 07/27/2021 0729   BILIRUBINUR NEGATIVE 07/27/2021 0729   BILIRUBINUR Negative 09/03/2014 1200   KETONESUR 20 (A) 07/27/2021 0729   PROTEINUR NEGATIVE 07/27/2021 0729   NITRITE NEGATIVE 07/27/2021 0729   LEUKOCYTESUR NEGATIVE 07/27/2021 0729   LEUKOCYTESUR Negative 09/03/2014 1200   Sepsis Labs: Invalid input(s): PROCALCITONIN, [place]  Microbiology: Recent Results (  from the past 240 hour(s))  Resp Panel by RT-PCR (Flu A&B, Covid) Nasopharyngeal Swab     Status: None   Collection Time: 07/21/21  7:54 PM   Specimen: Nasopharyngeal Swab; Nasopharyngeal(NP) swabs in vial transport medium  Result Value Ref Range Status   SARS Coronavirus 2 by RT PCR NEGATIVE NEGATIVE Final    Comment: (NOTE) SARS-CoV-2 target nucleic acids are NOT DETECTED.  The SARS-CoV-2 RNA is generally detectable in upper respiratory specimens during the acute phase of infection. The lowest concentration of SARS-CoV-2 viral copies this assay can detect is 138 copies/mL. A negative result does not preclude SARS-Cov-2 infection and should not be used as the sole basis for treatment or other patient management decisions. A negative result may occur with  improper specimen collection/handling, submission of specimen other than nasopharyngeal swab, presence of viral mutation(s) within the areas targeted by this assay, and inadequate number of viral copies(<138 copies/mL). A negative result must be combined with clinical observations, patient history, and epidemiological information. The expected result is Negative.  Fact Sheet for Patients:  EntrepreneurPulse.com.au  Fact Sheet for Healthcare Providers:  IncredibleEmployment.be  This test is no t yet approved or cleared by the [place] FDA and  has been authorized for detection and/or diagnosis of SARS-CoV-2 by FDA under an Emergency Use Authorization (EUA). This EUA  will remain  in effect (meaning this test can be used) for the duration of the COVID-19 declaration under Section 564(b)(1) of the Act, 21 U.S.C.section 360bbb-3(b)(1), unless the authorization is terminated  or revoked sooner.       Influenza A by PCR NEGATIVE NEGATIVE Final   Influenza B by PCR NEGATIVE NEGATIVE Final    Comment: (NOTE) The Xpert Xpress SARS-CoV-2/FLU/RSV plus assay is intended as an aid in the diagnosis of influenza from Nasopharyngeal swab specimens and should not be used as a sole basis for treatment. Nasal washings and aspirates are unacceptable for Xpert Xpress SARS-CoV-2/FLU/RSV testing.  Fact Sheet for Patients: EntrepreneurPulse.com.au  Fact Sheet for Healthcare Providers: IncredibleEmployment.be  This test is not yet approved or cleared by the [place] FDA and has been authorized for detection and/or diagnosis of SARS-CoV-2 by FDA under an Emergency Use Authorization (EUA). This EUA will remain in effect (meaning this test can be used) for the duration of the COVID-19 declaration under Section 564(b)(1) of the Act, 21 U.S.C. section 360bbb-3(b)(1), unless the authorization is terminated or revoked.  Performed at [facility], [address], [place], [place] [address]   Urine Culture     Status: None   Collection Time: 07/27/21  3:35 PM   Specimen: Urine, Catheterized  Result Value Ref Range Status   Specimen Description URINE, CATHETERIZED  Final   Special Requests NONE  Final   Culture   Final    NO GROWTH Performed at [facility], [address], [place],  [address]    Report Status 07/28/2021 FINAL  Final  Resp Panel by RT-PCR (Flu A&B, Covid) Nasopharyngeal Swab     Status: None   Collection Time: 07/28/21  7:25 PM   Specimen: Nasopharyngeal Swab; Nasopharyngeal(NP) swabs in vial transport medium  Result Value Ref Range Status   SARS Coronavirus 2 by RT PCR NEGATIVE NEGATIVE Final     Comment: (NOTE) SARS-CoV-2 target nucleic acids are NOT DETECTED.  The SARS-CoV-2 RNA is generally detectable in upper respiratory specimens during the acute phase of infection. The lowest concentration of SARS-CoV-2 viral copies this assay can detect is 138  copies/mL. A negative result does not preclude SARS-Cov-2 infection and should not be used as the sole basis for treatment or other patient management decisions. A negative result may occur with  improper specimen collection/handling, submission of specimen other than nasopharyngeal swab, presence of viral mutation(s) within the areas targeted by this assay, and inadequate number of viral copies(<138 copies/mL). A negative result must be combined with clinical observations, patient history, and epidemiological information. The expected result is Negative.  Fact Sheet for Patients:  EntrepreneurPulse.com.au  Fact Sheet for Healthcare Providers:  IncredibleEmployment.be  This test is no t yet approved or cleared by the [place] FDA and  has been authorized for detection and/or diagnosis of SARS-CoV-2 by FDA under an Emergency Use Authorization (EUA). This EUA will remain  in effect (meaning this test can be used) for the duration of the COVID-19 declaration under Section 564(b)(1) of the Act, 21 U.S.C.section 360bbb-3(b)(1), unless the authorization is terminated  or revoked sooner.       Influenza A by PCR NEGATIVE NEGATIVE Final   Influenza B by PCR NEGATIVE NEGATIVE Final    Comment: (NOTE) The Xpert Xpress SARS-CoV-2/FLU/RSV plus assay is intended as an aid in the diagnosis of influenza from Nasopharyngeal swab specimens and should not be used as a sole basis for treatment. Nasal washings and aspirates are unacceptable for Xpert Xpress SARS-CoV-2/FLU/RSV testing.  Fact Sheet for Patients: EntrepreneurPulse.com.au  Fact Sheet for Healthcare  Providers: IncredibleEmployment.be  This test is not yet approved or cleared by the [place] FDA and has been authorized for detection and/or diagnosis of SARS-CoV-2 by FDA under an Emergency Use Authorization (EUA). This EUA will remain in effect (meaning this test can be used) for the duration of the COVID-19 declaration under Section 564(b)(1) of the Act, 21 U.S.C. section 360bbb-3(b)(1), unless the authorization is terminated or revoked.  Performed at [facility], [place] [address] [place], [place] [address]     Radiology Studies: No results found.    Huberta Tompkins T. [place]  If 7PM-7AM, please contact night-coverage [email or web address] 07/29/2021, 2:23 PM

## 2021-07-30 ENCOUNTER — Inpatient Hospital Stay (HOSPITAL_COMMUNITY): Payer: Medicare HMO

## 2021-07-30 DIAGNOSIS — R52 Pain, unspecified: Secondary | ICD-10-CM | POA: Diagnosis not present

## 2021-07-30 DIAGNOSIS — S72001A Fracture of unspecified part of neck of right femur, initial encounter for closed fracture: Secondary | ICD-10-CM | POA: Diagnosis not present

## 2021-07-30 DIAGNOSIS — E039 Hypothyroidism, unspecified: Secondary | ICD-10-CM | POA: Diagnosis not present

## 2021-07-30 DIAGNOSIS — E119 Type 2 diabetes mellitus without complications: Secondary | ICD-10-CM | POA: Diagnosis not present

## 2021-07-30 DIAGNOSIS — N390 Urinary tract infection, site not specified: Secondary | ICD-10-CM | POA: Diagnosis not present

## 2021-07-30 DIAGNOSIS — E43 Unspecified severe protein-calorie malnutrition: Secondary | ICD-10-CM

## 2021-07-30 DIAGNOSIS — K219 Gastro-esophageal reflux disease without esophagitis: Secondary | ICD-10-CM | POA: Diagnosis not present

## 2021-07-30 DIAGNOSIS — D513 Other dietary vitamin B12 deficiency anemia: Secondary | ICD-10-CM | POA: Diagnosis not present

## 2021-07-30 DIAGNOSIS — R309 Painful micturition, unspecified: Secondary | ICD-10-CM | POA: Diagnosis not present

## 2021-07-30 DIAGNOSIS — D696 Thrombocytopenia, unspecified: Secondary | ICD-10-CM | POA: Diagnosis not present

## 2021-07-30 DIAGNOSIS — Z043 Encounter for examination and observation following other accident: Secondary | ICD-10-CM | POA: Diagnosis not present

## 2021-07-30 DIAGNOSIS — R279 Unspecified lack of coordination: Secondary | ICD-10-CM | POA: Diagnosis not present

## 2021-07-30 DIAGNOSIS — Y92003 Bedroom of unspecified non-institutional (private) residence as the place of occurrence of the external cause: Secondary | ICD-10-CM | POA: Diagnosis not present

## 2021-07-30 DIAGNOSIS — J449 Chronic obstructive pulmonary disease, unspecified: Secondary | ICD-10-CM | POA: Diagnosis not present

## 2021-07-30 DIAGNOSIS — M47812 Spondylosis without myelopathy or radiculopathy, cervical region: Secondary | ICD-10-CM | POA: Diagnosis not present

## 2021-07-30 DIAGNOSIS — M4312 Spondylolisthesis, cervical region: Secondary | ICD-10-CM | POA: Diagnosis not present

## 2021-07-30 DIAGNOSIS — I609 Nontraumatic subarachnoid hemorrhage, unspecified: Secondary | ICD-10-CM | POA: Diagnosis not present

## 2021-07-30 DIAGNOSIS — F32A Depression, unspecified: Secondary | ICD-10-CM | POA: Diagnosis not present

## 2021-07-30 DIAGNOSIS — S0990XA Unspecified injury of head, initial encounter: Secondary | ICD-10-CM | POA: Diagnosis not present

## 2021-07-30 DIAGNOSIS — J989 Respiratory disorder, unspecified: Secondary | ICD-10-CM | POA: Diagnosis not present

## 2021-07-30 DIAGNOSIS — D693 Immune thrombocytopenic purpura: Secondary | ICD-10-CM | POA: Diagnosis not present

## 2021-07-30 DIAGNOSIS — J45909 Unspecified asthma, uncomplicated: Secondary | ICD-10-CM | POA: Diagnosis not present

## 2021-07-30 DIAGNOSIS — W19XXXA Unspecified fall, initial encounter: Secondary | ICD-10-CM | POA: Diagnosis not present

## 2021-07-30 DIAGNOSIS — R2689 Other abnormalities of gait and mobility: Secondary | ICD-10-CM | POA: Diagnosis not present

## 2021-07-30 DIAGNOSIS — Z743 Need for continuous supervision: Secondary | ICD-10-CM | POA: Diagnosis not present

## 2021-07-30 DIAGNOSIS — S72001D Fracture of unspecified part of neck of right femur, subsequent encounter for closed fracture with routine healing: Secondary | ICD-10-CM | POA: Diagnosis not present

## 2021-07-30 DIAGNOSIS — M25551 Pain in right hip: Secondary | ICD-10-CM | POA: Diagnosis not present

## 2021-07-30 DIAGNOSIS — M87252 Osteonecrosis due to previous trauma, left femur: Secondary | ICD-10-CM | POA: Diagnosis not present

## 2021-07-30 DIAGNOSIS — Z79899 Other long term (current) drug therapy: Secondary | ICD-10-CM | POA: Diagnosis not present

## 2021-07-30 DIAGNOSIS — D509 Iron deficiency anemia, unspecified: Secondary | ICD-10-CM | POA: Diagnosis not present

## 2021-07-30 DIAGNOSIS — F0390 Unspecified dementia without behavioral disturbance: Secondary | ICD-10-CM | POA: Diagnosis not present

## 2021-07-30 DIAGNOSIS — J441 Chronic obstructive pulmonary disease with (acute) exacerbation: Secondary | ICD-10-CM | POA: Diagnosis not present

## 2021-07-30 DIAGNOSIS — R5381 Other malaise: Secondary | ICD-10-CM | POA: Diagnosis not present

## 2021-07-30 DIAGNOSIS — Z96611 Presence of right artificial shoulder joint: Secondary | ICD-10-CM | POA: Diagnosis not present

## 2021-07-30 DIAGNOSIS — R609 Edema, unspecified: Secondary | ICD-10-CM | POA: Diagnosis not present

## 2021-07-30 DIAGNOSIS — F411 Generalized anxiety disorder: Secondary | ICD-10-CM | POA: Diagnosis not present

## 2021-07-30 DIAGNOSIS — I10 Essential (primary) hypertension: Secondary | ICD-10-CM | POA: Diagnosis not present

## 2021-07-30 DIAGNOSIS — G319 Degenerative disease of nervous system, unspecified: Secondary | ICD-10-CM | POA: Diagnosis not present

## 2021-07-30 DIAGNOSIS — F419 Anxiety disorder, unspecified: Secondary | ICD-10-CM | POA: Diagnosis not present

## 2021-07-30 DIAGNOSIS — S0003XA Contusion of scalp, initial encounter: Secondary | ICD-10-CM | POA: Diagnosis not present

## 2021-07-30 DIAGNOSIS — D649 Anemia, unspecified: Secondary | ICD-10-CM | POA: Diagnosis not present

## 2021-07-30 DIAGNOSIS — G309 Alzheimer's disease, unspecified: Secondary | ICD-10-CM | POA: Diagnosis not present

## 2021-07-30 DIAGNOSIS — E785 Hyperlipidemia, unspecified: Secondary | ICD-10-CM | POA: Diagnosis not present

## 2021-07-30 DIAGNOSIS — M87052 Idiopathic aseptic necrosis of left femur: Secondary | ICD-10-CM | POA: Diagnosis not present

## 2021-07-30 DIAGNOSIS — R4182 Altered mental status, unspecified: Secondary | ICD-10-CM | POA: Diagnosis not present

## 2021-07-30 DIAGNOSIS — G629 Polyneuropathy, unspecified: Secondary | ICD-10-CM | POA: Diagnosis not present

## 2021-07-30 LAB — CBC WITH DIFFERENTIAL/PLATELET
Abs Immature Granulocytes: 0 10*3/uL (ref 0.00–0.07)
Basophils Absolute: 0.4 10*3/uL — ABNORMAL HIGH (ref 0.0–0.1)
Basophils Relative: 3 %
Eosinophils Absolute: 1 10*3/uL — ABNORMAL HIGH (ref 0.0–0.5)
Eosinophils Relative: 7 %
HCT: 38.7 % (ref 36.0–46.0)
Hemoglobin: 12.7 g/dL (ref 12.0–15.0)
Lymphocytes Relative: 20 %
Lymphs Abs: 2.9 10*3/uL (ref 0.7–4.0)
MCH: 30.1 pg (ref 26.0–34.0)
MCHC: 32.8 g/dL (ref 30.0–36.0)
MCV: 91.7 fL (ref 80.0–100.0)
Monocytes Absolute: 1 10*3/uL (ref 0.1–1.0)
Monocytes Relative: 7 %
Neutro Abs: 9.1 10*3/uL — ABNORMAL HIGH (ref 1.7–7.7)
Neutrophils Relative %: 63 %
Platelets: UNDETERMINED 10*3/uL (ref 150–400)
RBC: 4.22 MIL/uL (ref 3.87–5.11)
RDW: 17.7 % — ABNORMAL HIGH (ref 11.5–15.5)
Smear Review: UNDETERMINED
WBC: 14.4 10*3/uL — ABNORMAL HIGH (ref 4.0–10.5)
nRBC: 0 % (ref 0.0–0.2)

## 2021-07-30 MED ORDER — ALPRAZOLAM 0.5 MG PO TABS: 0.5000 mg | ORAL_TABLET | Freq: Three times a day (TID) | ORAL | 0 refills | Status: AC | PRN

## 2021-07-30 MED ORDER — METHOCARBAMOL 500 MG PO TABS: 500.0000 mg | ORAL_TABLET | Freq: Four times a day (QID) | ORAL | 0 refills | Status: AC | PRN

## 2021-07-30 MED ORDER — SENNOSIDES-DOCUSATE SODIUM 8.6-50 MG PO TABS: 1.0000 | ORAL_TABLET | Freq: Two times a day (BID) | ORAL | 0 refills | Status: AC

## 2021-07-30 MED ORDER — POLYETHYLENE GLYCOL 3350 17 G PO PACK: 17.0000 g | PACK | Freq: Every day | ORAL | 0 refills | Status: AC | PRN

## 2021-07-30 NOTE — Care Management Important Message (Signed)
Important Message  Patient Details  Name: Ariana [name] MRN: [id number] Date of Birth: 14-Nov-1932   Medicare Important Message Given:  Yes Patient could not sign due to Illness.  A signed copy left at bedside.    Dana Debo 07/30/2021, 3:17 PM

## 2021-07-30 NOTE — TOC Transition Note (Signed)
Transition of Care [facility]) - CM/SW Discharge Note   Patient Details  Name: Jeanee Fabre MRN: [id number] Date of Birth: 1932-11-29  [facility] CM/SW Contact:  Vinie Sill, LCSW Phone Number: 07/30/2021, 12:06 PM   Clinical Narrative:     Patient will Discharge to: [facility] Discharge Date: 07/30/2021 Family Notified: DSS SW [place] Face Ms. Robina Ade  Transport By: Corey Harold @ 2pm  Per MD patient is ready for discharge. RN, patient, and facility notified of discharge. Discharge Summary sent to facility and faxed to [place]. RN given number for [phone] room B4-1. Ambulance transport requested for patient.   Clinical Social Worker signing off.  Thurmond Butts, MSW, LCSW Clinical Social Worker    Final next level of care: Skilled Nursing Facility Barriers to Discharge: Barriers Resolved   Patient Goals and CMS Choice        Discharge Placement              Patient [facility] bed at:  [facility]) Patient to be transferred to facility by: [facility] Name of family member notified: DSS SW Robina Ade Patient and family notified of of transfer: 07/30/21  Discharge Plan and Services In-house Referral: Clinical Social Work                                   Social Determinants of Health (SDOH) Interventions     Readmission Risk Interventions No flowsheet data found.

## 2021-07-30 NOTE — Discharge Summary (Signed)
Physician Discharge Summary  Ariana [name] FYB:[id number] DOB: [birth year] DOA: 07/21/2021  PCP: Caprice Renshaw, MD  Admit date: 07/21/2021 Discharge date: 07/30/2021  Admitted From: SNF  disposition: SNF  Recommendations for Outpatient Follow-up:  Follow up with SNF provider at earliest convenience with repeat CBC/BMP in the next few days Outpatient follow-up with orthopedics.  Wound care/discharge pain medication as per orthopedics recommendations. Follow up in ED if symptoms worsen or new appear   Home Health: No Equipment/Devices: None  Discharge Condition: Guarded  CODE STATUS: DNR Diet recommendation: Heart healthy  Brief/Interim Summary: 87 year old F with PMH of ITP, asthma/COPD, Alzheimer's dementia, SAH, hypothyroidism, HTN, HLD and GERD brought to [facility] ED by EMS with right hip pain and head pain after she sustained a fall, and found to have impacted subcapital right femoral neck fracture.  She was transferred to [facility] for surgical intervention.  CT head showed slightly decreased size of anterior left frontal intraparenchymal hematoma.  Oncology/hematology consulted about thrombocytopenia and recommended IVIG and Nplate that she received on 10/10 and 10/11 respectively.  Also received 1 unit of platelet.  Thrombocytopenia improved.  Underwent surgical intervention for right femoral neck fracture on 07/24/2021 by orthopedics.  Postoperatively, she has remained stable hemodynamically currently.  PT recommended SNF placement.  She will be discharged to SNF once bed is available.  Discharge Diagnoses:   Impacted subcapital right femoral neck fracture likely due to fall at nursing facility -Status post ORIF by orthopedics/Dr. Erlinda Hong on 07/24/2021 -Continue pain management, bowel regimen, fall precautions.  No chemical DVT prophylaxis in the setting of thrombocytopenia -PT recommended SNF placement. -Wound care/discharge pain medication as per orthopedics recommendations.  Outpatient  follow-up with orthopedics. -Discharge to SNF once bed is available  Thrombocytopenia/ITP -s/p IVIG and Nplate on 52/77/8242.  Had platelet transfusion on 10/12.  Not able to get platelet count due to clumping. -Outpatient follow-up with hematology/oncology  History of subarachnoid hemorrhage -improved on CT head -No indication for neurosurgical intervention per Dr. Cabbell/neurosurgery  Hypothyroidism Continue Synthroid  Essential hypertension -Continue amlodipine  History of COPD/asthma -Stable.  GERD Continue PPI  Leukocytosis -Possibly reactive.  Alzheimer's dementia Anxiety/depression -Only oriented to self.  Continue Namenda, Lamictal, BuSpar and Celexa and as needed Xanax.  Severe protein calorie malnutrition -Follow nutrition recommendations  Normocytic anemia -H&H currently relatively stable after initial drop. Discharge Instructions  Discharge Instructions     Diet - low sodium heart healthy   Complete by: As directed    Discharge wound care:   Complete by: As directed    As per orthopedics recommendations   Increase activity slowly   Complete by: As directed    Weight bearing as tolerated   Complete by: As directed       Allergies as of 07/30/2021       Reactions   Aspirin Other (See Comments)   Upset stomach   Diazepam Itching   sneezing   Tetanus Toxoids Swelling   Localized superficial swelling of skin   Morphine Itching, Rash        Medication List     STOP taking these medications    gabapentin 100 MG capsule Commonly known as: NEURONTIN   traMADol 50 MG tablet Commonly known as: ULTRAM       TAKE these medications    acetaminophen 325 MG tablet Commonly known as: TYLENOL Take 2 tablets (650 mg total) by mouth every 6 (six) hours as needed for mild pain (or Fever >/= 101).   ALPRAZolam 0.5 MG tablet  Commonly known as: XANAX Take 1 tablet (0.5 mg total) by mouth 3 (three) times daily as needed for anxiety.    amLODipine 10 MG tablet Commonly known as: NORVASC Take 1 tablet (10 mg total) by mouth daily.   busPIRone 15 MG tablet Commonly known as: BUSPAR Take 7.5 mg by mouth 2 (two) times daily.   citalopram 20 MG tablet Commonly known as: CELEXA Take 1 tablet (20 mg total) by mouth every morning.   Cyanocobalamin 1000 MCG Tbcr Take 1,000 mcg by mouth daily.   diclofenac Sodium 1 % Gel Commonly known as: VOLTAREN Apply 2 g topically in the morning and at bedtime. Both knees   Fluticasone-Salmeterol 250-50 MCG/DOSE Aepb Commonly known as: ADVAIR Inhale 1 puff into the lungs 2 (two) times daily as needed (shortness of breath).   HYDROcodone-acetaminophen 7.5-325 MG tablet Commonly known as: Norco Take 1-2 tablets by mouth 3 (three) times daily as needed for moderate pain.   iron polysaccharides 150 MG capsule Commonly known as: NIFEREX Take 1 capsule (150 mg total) by mouth daily.   lamoTRIgine 25 MG tablet Commonly known as: LAMICTAL Take 50 mg by mouth daily.   levothyroxine 50 MCG tablet Commonly known as: SYNTHROID Take 50 mcg by mouth daily before breakfast. Take 30 to 60 minutes before breakfast.   loratadine 10 MG tablet Commonly known as: CLARITIN Take 1 tablet (10 mg total) by mouth daily.   memantine 10 MG tablet Commonly known as: NAMENDA Take 10 mg by mouth 2 (two) times daily.   methocarbamol 500 MG tablet Commonly known as: ROBAXIN Take 1 tablet (500 mg total) by mouth every 6 (six) hours as needed for muscle spasms.   Multi-Vitamin tablet Take 1 tablet by mouth daily.   oxybutynin 5 MG 24 hr tablet Commonly known as: DITROPAN-XL Take 5 mg by mouth at bedtime.   pantoprazole 40 MG tablet Commonly known as: PROTONIX Take 40 mg by mouth daily.   polyethylene glycol 17 g packet Commonly known as: MIRALAX / GLYCOLAX Take 17 g by mouth daily as needed for moderate constipation.   rosuvastatin 5 MG tablet Commonly known as: CRESTOR Take 5 mg by  mouth every evening.   senna-docusate 8.6-50 MG tablet Commonly known as: Senokot-S Take 1 tablet by mouth 2 (two) times daily.   Vitamin D 50 MCG (2000 UT) tablet Take 2,000 Units by mouth daily.               Discharge Care Instructions  (From admission, onward)           Start     Ordered   07/30/21 0000  Discharge wound care:       Comments: As per orthopedics recommendations   07/30/21 0929   07/24/21 0000  Weight bearing as tolerated        07/24/21 1500            Follow-up Information     Leandrew Koyanagi, MD Follow up in 2 week(s).   Specialty: Orthopedic Surgery Why: For suture removal, For wound re-check Contact information: [place] [id number] [phone]                Allergies  Allergen Reactions   Aspirin Other (See Comments)    Upset stomach   Diazepam Itching    sneezing   Tetanus Toxoids Swelling    Localized superficial swelling of skin   Morphine Itching and Rash    Consultations: Orthopedic/oncology   Procedures/Studies:  DG Chest 1 View  Result Date: 07/11/2021 CLINICAL DATA:  Recent fall with left-sided pain, initial encounter EXAM: PORTABLE CHEST 1 VIEW COMPARISON:  05/04/2021 FINDINGS: Cardiac shadow is mildly prominent but stable. Aortic calcifications are noted. The lungs are well aerated bilaterally. No focal infiltrate or sizable effusion is seen. Postsurgical changes in the left shoulder are noted. No acute bony abnormality is seen. IMPRESSION: No acute abnormality noted. Electronically Signed   By: Inez Catalina M.D.   On: 07/11/2021 19:31   CT HEAD WO CONTRAST (5MM)  Result Date: 07/21/2021 CLINICAL DATA:  Fall, confusion EXAM: CT HEAD WITHOUT CONTRAST TECHNIQUE: Contiguous axial images were obtained from the base of the skull through the vertex without intravenous contrast. COMPARISON:  07/13/2021 FINDINGS: Brain: Again seen is the left anterior frontal intraparenchymal hemorrhage  measuring 2.0 x 1.6 cm compared to 2.1 x 2.0 cm previously. Probable small amount of subarachnoid blood within the left sylvian fissure posteriorly, not definitively seen on prior study. No acute infarction. No mass effect or midline shift. No hydrocephalus. Extensive chronic small vessel disease throughout the deep white matter. Diffuse cerebral atrophy. Vascular: No hyperdense vessel or unexpected calcification. Skull: No acute calvarial abnormality. Sinuses/Orbits: No acute findings Other: Soft tissue swelling in the right scalp. IMPRESSION: Slight decreased size of the anterior left frontal intraparenchymal hematoma since prior study. Focus of high density in the left sylvian fissure could reflect small acute subarachnoid hemorrhage, not definitively seen on prior study. These results were called by telephone at the time of interpretation on 07/21/2021 at 6:10 pm to provider Dr. Roderic Palau, Who verbally acknowledged these results. Electronically Signed   By: Rolm Baptise M.D.   On: 07/21/2021 18:13   CT Head Wo Contrast  Result Date: 07/13/2021 CLINICAL DATA:  Mental status change.  Recent hemorrhage EXAM: CT HEAD WITHOUT CONTRAST TECHNIQUE: Contiguous axial images were obtained from the base of the skull through the vertex without intravenous contrast. COMPARISON:  07/11/2021 FINDINGS: Brain: Larger anterior and parasagittal left frontal hematoma along the surface of the brain which measures 20 x 21 x 3.6 cm. Location appears intraparenchymal, although on prior there was clearly a subarachnoid appearance. No adjacent edema or significant mass effect in the setting of atrophy. Chronic small vessel ischemia in the hemispheric white matter which is age congruent. No visible cortical infarct. Reportedly there was a hemorrhage precipitating the initial bleed. Vascular: Atheromatous calcification Skull: Negative Sinuses/Orbits: No visible injury.  Bilateral cataract resection. Other: Critical Value/emergent results  were called by telephone at the time of interpretation on 07/13/2021 at 10:58 am to provider [facility] , who verbally acknowledged these results. IMPRESSION: Larger and now 7 cc left frontal parasagittal hemorrhage which appears both parenchymal and subarachnoid. No edema or significant mass effect. Electronically Signed   By: Jorje Guild M.D.   On: 07/13/2021 10:59   CT HEAD WO CONTRAST  Result Date: 07/11/2021 CLINICAL DATA:  Recent fall with facial swelling, initial encounter EXAM: CT HEAD WITHOUT CONTRAST TECHNIQUE: Contiguous axial images were obtained from the base of the skull through the vertex without intravenous contrast. COMPARISON:  02/03/2021 FINDINGS: Brain: Chronic atrophic and ischemic changes are again identified. There is minimal subarachnoid hemorrhage along the medial aspect of the left frontal lobe best seen on images 16 through 18 of series 2. No other focal area of hemorrhage is identified. Vascular: No hyperdense vessel or unexpected calcification. Skull: Normal. Negative for fracture or focal lesion. Sinuses/Orbits: No acute finding. Other: Mild soft tissue swelling is  noted over the left supraorbital region consistent with the recent injury. IMPRESSION: Mild subarachnoid hemorrhage in the medial aspect of the left frontal lobe. Chronic atrophic and ischemic changes. Left supraorbital soft tissue swelling consistent with the recent injury. Critical Value/emergent results were called by telephone at the time of interpretation on 07/11/2021 at 7:18 pm to Dr. Varney Biles , who verbally acknowledged these results. Electronically Signed   By: Inez Catalina M.D.   On: 07/11/2021 19:20   CT Cervical Spine Wo Contrast  Result Date: 07/11/2021 CLINICAL DATA:  Neck trauma.  Fall. EXAM: CT CERVICAL SPINE WITHOUT CONTRAST TECHNIQUE: Multidetector CT imaging of the cervical spine was performed without intravenous contrast. Multiplanar CT image reconstructions were also generated.  COMPARISON:  Cervical spine CT 02/03/2021.  Is this FINDINGS: Alignment: There is mild chronic anterolisthesis at C2-C3 and C7-T1. Alignment is otherwise anatomic. Skull base and vertebrae: Visualized skull base is intact. No acute fractures. No focal osseous lesions. Soft tissues and spinal canal: No prevertebral fluid or swelling. No visible canal hematoma. Disc levels: There is intervertebral disc space narrowing throughout the cervical spine which is severe at C4-C5, C5-C6, C6-C7 and C7-T1. Findings are similar to the prior examination. There is no severe central canal or neural foraminal stenosis at any level. Upper chest: Negative. Other: None. IMPRESSION: 1. No acute fracture or traumatic subluxation. 2. Stable multilevel degenerative changes. Electronically Signed   By: Ronney Asters M.D.   On: 07/11/2021 20:20   CT Hip Right Wo Contrast  Result Date: 07/21/2021 CLINICAL DATA:  [place], right hip pain EXAM: CT OF THE RIGHT HIP WITHOUT CONTRAST TECHNIQUE: Multidetector CT imaging of the right hip was performed according to the standard protocol. Multiplanar CT image reconstructions were also generated. COMPARISON:  07/21/2021 FINDINGS: Bones/Joint/Cartilage There is an impacted subcapital right femoral neck fracture. Femoral head remains aligned anatomic Leigh with the acetabulum. No other acute displaced fractures. Postsurgical changes from lower lumbar posterior laminectomy and fusion. Ligaments Suboptimally assessed by CT. Muscles and Tendons Unremarkable. Soft tissues Mild subcutaneous fat stranding in the right gluteal region may reflect posttraumatic change. No fluid collection or hematoma. Visualized intrapelvic structures are grossly unremarkable. Reconstructed images demonstrate no additional findings. IMPRESSION: 1. Impacted subcapital right femoral neck fracture. Electronically Signed   By: Randa Ngo M.D.   On: 07/21/2021 19:09   DG C-Arm 1-60 Min-No Report  Result Date:  07/24/2021 Fluoroscopy was utilized by the requesting physician.  No radiographic interpretation.   DG HIP OPERATIVE UNILAT WITH PELVIS RIGHT  Result Date: 07/24/2021 CLINICAL DATA:  Surgery, elective Z41.9 (ICD-10-CM) EXAM: OPERATIVE RIGHT HIP (WITH PELVIS IF PERFORMED)  VIEWS TECHNIQUE: Fluoroscopic spot image(s) were submitted for interpretation post-operatively. COMPARISON:  07/21/2021. FINDINGS: Fluoro time: 56 seconds. Reported radiation: 6.64 mGy. Two C-arm fluoroscopic images were obtained intraoperatively and submitted for post operative interpretation. These images demonstrate fixation of the right femoral neck fracture. Alignment appears slightly improved. Please see the performing provider's procedural report for further detail. IMPRESSION: Intraoperative fluoroscopy, as detailed above. Electronically Signed   By: Margaretha Sheffield M.D.   On: 07/24/2021 19:58   DG Hip Unilat With Pelvis 2-3 Views Left  Result Date: 07/11/2021 CLINICAL DATA:  Recent fall with left hip pain, initial encounter EXAM: DG HIP (WITH OR WITHOUT PELVIS) 2-3V LEFT COMPARISON:  02/03/2021 FINDINGS: Pelvic ring is intact. Postsurgical changes are noted in the lumbosacral spine as well as in the proximal left femur. There is been further progressive collapse of the left femoral head  when compared with the prior exam. This is of uncertain chronicity but new from the prior exam of 02/03/2021. The surgically placed screws appear somewhat lucent when compared with the prior exam this is likely related to the head collapse. Mild degenerative changes of the hip joint are seen. IMPRESSION: Postsurgical changes in the proximal left femur. Progressive collapse of the left femoral head is noted with some mild displacement of previously placed surgical screws. The collapse of the femoral head is of uncertain chronicity Electronically Signed   By: Inez Catalina M.D.   On: 07/11/2021 19:33   DG Hip Unilat W or Wo Pelvis 2-3 Views  Right  Result Date: 07/21/2021 CLINICAL DATA:  Status post fall with right hip pain. EXAM: DG HIP (WITH OR WITHOUT PELVIS) 2-3V RIGHT COMPARISON:  None. FINDINGS: Foreshortening of the right femoral neck is seen at the junction of the right femoral head. There is no evidence of dislocation. Moderate severity degenerative changes are seen in the form of joint space narrowing and acetabular sclerosis. Three radiopaque surgical screws are seen within the proximal left femur with bilateral pedicle screws noted along the lower lumbar spine. IMPRESSION: Findings suspicious for a nondisplaced fracture of the right femoral head and neck. CT correlation is recommended. Electronically Signed   By: Virgina Norfolk M.D.   On: 07/21/2021 17:36      Subjective: Patient seen and examined at bedside.  Awake, poor historian.  Confused.  No overnight fever, vomiting, worsening chest pain reported. Discharge Exam: Vitals:   07/30/21 0430 07/30/21 0753  BP: 140/72 121/70  Pulse: 77 68  Resp: 20 15  Temp: 98.4 F (36.9 C) 98.6 F (37 C)  SpO2: 99% 98%    General: Pt is awake, confused.  No distress.  Currently on room air.  Cardiovascular: rate controlled, S1/S2 + Respiratory: bilateral decreased breath sounds at bases with some scattered crackles Abdominal: Soft, NT, ND, bowel sounds + Extremities: Trace lower extremity edema; no cyanosis    The results of significant diagnostics from this hospitalization (including imaging, microbiology, ancillary and laboratory) are listed below for reference.     Microbiology: Recent Results (from the past 240 hour(s))  Resp Panel by RT-PCR (Flu A&B, Covid) Nasopharyngeal Swab     Status: None   Collection Time: 07/21/21  7:54 PM   Specimen: Nasopharyngeal Swab; Nasopharyngeal(NP) swabs in vial transport medium  Result Value Ref Range Status   SARS Coronavirus 2 by RT PCR NEGATIVE NEGATIVE Final    Comment: (NOTE) SARS-CoV-2 target nucleic acids are NOT  DETECTED.  The SARS-CoV-2 RNA is generally detectable in upper respiratory specimens during the acute phase of infection. The lowest concentration of SARS-CoV-2 viral copies this assay can detect is 138 copies/mL. A negative result does not preclude SARS-Cov-2 infection and should not be used as the sole basis for treatment or other patient management decisions. A negative result may occur with  improper specimen collection/handling, submission of specimen other than nasopharyngeal swab, presence of viral mutation(s) within the areas targeted by this assay, and inadequate number of viral copies(<138 copies/mL). A negative result must be combined with clinical observations, patient history, and epidemiological information. The expected result is Negative.  Fact Sheet for Patients:  EntrepreneurPulse.com.au  Fact Sheet for Healthcare Providers:  IncredibleEmployment.be  This test is no t yet approved or cleared by the [place] FDA and  has been authorized for detection and/or diagnosis of SARS-CoV-2 by FDA under an Emergency Use Authorization (EUA). This EUA will remain  in effect (meaning this test can be used) for the duration of the COVID-19 declaration under Section 564(b)(1) of the Act, 21 U.S.C.section 360bbb-3(b)(1), unless the authorization is terminated  or revoked sooner.       Influenza A by PCR NEGATIVE NEGATIVE Final   Influenza B by PCR NEGATIVE NEGATIVE Final    Comment: (NOTE) The Xpert Xpress SARS-CoV-2/FLU/RSV plus assay is intended as an aid in the diagnosis of influenza from Nasopharyngeal swab specimens and should not be used as a sole basis for treatment. Nasal washings and aspirates are unacceptable for Xpert Xpress SARS-CoV-2/FLU/RSV testing.  Fact Sheet for Patients: EntrepreneurPulse.com.au  Fact Sheet for Healthcare Providers: IncredibleEmployment.be  This test is not yet  approved or cleared by the [place] FDA and has been authorized for detection and/or diagnosis of SARS-CoV-2 by FDA under an Emergency Use Authorization (EUA). This EUA will remain in effect (meaning this test can be used) for the duration of the COVID-19 declaration under Section 564(b)(1) of the Act, 21 U.S.C. section 360bbb-3(b)(1), unless the authorization is terminated or revoked.  Performed at [facility], [address] [place], [place] [address]   Urine Culture     Status: None   Collection Time: 07/27/21  3:35 PM   Specimen: Urine, Catheterized  Result Value Ref Range Status   Specimen Description URINE, CATHETERIZED  Final   Special Requests NONE  Final   Culture   Final    NO GROWTH Performed at [facility], [address], [place], [place] [address]    Report Status 07/28/2021 FINAL  Final  Resp Panel by RT-PCR (Flu A&B, Covid) Nasopharyngeal Swab     Status: None   Collection Time: 07/28/21  7:25 PM   Specimen: Nasopharyngeal Swab; Nasopharyngeal(NP) swabs in vial transport medium  Result Value Ref Range Status   SARS Coronavirus 2 by RT PCR NEGATIVE NEGATIVE Final    Comment: (NOTE) SARS-CoV-2 target nucleic acids are NOT DETECTED.  The SARS-CoV-2 RNA is generally detectable in upper respiratory specimens during the acute phase of infection. The lowest concentration of SARS-CoV-2 viral copies this assay can detect is 138 copies/mL. A negative result does not preclude SARS-Cov-2 infection and should not be used as the sole basis for treatment or other patient management decisions. A negative result may occur with  improper specimen collection/handling, submission of specimen other than nasopharyngeal swab, presence of viral mutation(s) within the areas targeted by this assay, and inadequate number of viral copies(<138 copies/mL). A negative result must be combined with clinical observations, patient history, and epidemiological information. The  expected result is Negative.  Fact Sheet for Patients:  EntrepreneurPulse.com.au  Fact Sheet for Healthcare Providers:  IncredibleEmployment.be  This test is no t yet approved or cleared by the [place] FDA and  has been authorized for detection and/or diagnosis of SARS-CoV-2 by FDA under an Emergency Use Authorization (EUA). This EUA will remain  in effect (meaning this test can be used) for the duration of the COVID-19 declaration under Section 564(b)(1) of the Act, 21 U.S.C.section 360bbb-3(b)(1), unless the authorization is terminated  or revoked sooner.       Influenza A by PCR NEGATIVE NEGATIVE Final   Influenza B by PCR NEGATIVE NEGATIVE Final    Comment: (NOTE) The Xpert Xpress SARS-CoV-2/FLU/RSV plus assay is intended as an aid in the diagnosis of influenza from Nasopharyngeal swab specimens and should not be used as a sole basis for treatment. Nasal washings and aspirates are unacceptable for Xpert  Xpress SARS-CoV-2/FLU/RSV testing.  Fact Sheet for Patients: EntrepreneurPulse.com.au  Fact Sheet for Healthcare Providers: IncredibleEmployment.be  This test is not yet approved or cleared by the [place] FDA and has been authorized for detection and/or diagnosis of SARS-CoV-2 by FDA under an Emergency Use Authorization (EUA). This EUA will remain in effect (meaning this test can be used) for the duration of the COVID-19 declaration under Section 564(b)(1) of the Act, 21 U.S.C. section 360bbb-3(b)(1), unless the authorization is terminated or revoked.  Performed at [facility], [place] [address] [place], [place] [address]      Labs: BNP (last 3 results) No results for input(s): BNP in the last 8760 hours. Basic Metabolic Panel: Recent Labs  Lab 07/24/21 0318 07/25/21 0335 07/26/21 0251 07/28/21 0419 07/29/21 0326  NA 134* 135 136 137 141  K 3.6 3.6 3.7 3.4* 4.2  CL 104  104 105 105 109  CO2 21* 23 24 24 24   GLUCOSE 85 81 95 107* 99  BUN 15 19 17 17 19   CREATININE 0.68 0.74 0.66 0.60 0.67  CALCIUM 8.7* 8.5* 8.8* 8.8* 9.3  MG 2.0 1.8 2.1 1.9 2.2  PHOS 3.4 3.6 3.0 3.1 3.8   Liver Function Tests: Recent Labs  Lab 07/24/21 0318 07/25/21 0335 07/26/21 0251 07/28/21 0419 07/29/21 0326  ALBUMIN 2.8* 2.7* 2.8* 2.7* 3.0*   No results for input(s): LIPASE, AMYLASE in the last 168 hours. No results for input(s): AMMONIA in the last 168 hours. CBC: Recent Labs  Lab 07/26/21 0251 07/27/21 0627 07/27/21 1000 07/28/21 0721 07/29/21 0326 07/30/21 0425  WBC 15.1* 15.9*  --  15.1* 16.4* 14.4*  NEUTROABS  --  11.5*  --  11.1*  --  9.1*  HGB 11.6* 12.0  --  11.9* 12.8 12.7  HCT 35.3* 35.5*  --  36.4 39.4 38.7  MCV 90.7 90.1  --  90.5 92.5 91.7  PLT 65* PLATELET CLUMPS NOTED ON SMEAR, UNABLE TO ESTIMATE PLATELET CLUMPS NOTED ON SMEAR, UNABLE TO ESTIMATE PLATELET CLUMPS NOTED ON SMEAR, UNABLE TO ESTIMATE PLATELET CLUMPS NOTED ON SMEAR, UNABLE TO ESTIMATE PLATELET CLUMPS NOTED ON SMEAR, UNABLE TO ESTIMATE   Cardiac Enzymes: Recent Labs  Lab 07/24/21 0318  CKTOTAL 28*   BNP: Invalid input(s): POCBNP CBG: No results for input(s): GLUCAP in the last 168 hours. D-Dimer No results for input(s): DDIMER in the last 72 hours. Hgb A1c No results for input(s): HGBA1C in the last 72 hours. Lipid Profile No results for input(s): CHOL, HDL, LDLCALC, TRIG, CHOLHDL, LDLDIRECT in the last 72 hours. Thyroid function studies No results for input(s): TSH, T4TOTAL, T3FREE, THYROIDAB in the last 72 hours.  Invalid input(s): FREET3 Anemia work up No results for input(s): VITAMINB12, FOLATE, FERRITIN, TIBC, IRON, RETICCTPCT in the last 72 hours. Urinalysis    Component Value Date/Time   COLORURINE YELLOW 07/27/2021 0729   APPEARANCEUR HAZY (A) 07/27/2021 0729   APPEARANCEUR Clear 09/03/2014 1200   LABSPEC 1.019 07/27/2021 0729   LABSPEC 1.018 09/03/2014 1200    PHURINE 5.0 07/27/2021 0729   GLUCOSEU NEGATIVE 07/27/2021 0729   GLUCOSEU Negative 09/03/2014 1200   HGBUR NEGATIVE 07/27/2021 0729   BILIRUBINUR NEGATIVE 07/27/2021 0729   BILIRUBINUR Negative 09/03/2014 1200   KETONESUR 20 (A) 07/27/2021 0729   PROTEINUR NEGATIVE 07/27/2021 0729   NITRITE NEGATIVE 07/27/2021 0729   LEUKOCYTESUR NEGATIVE 07/27/2021 0729   LEUKOCYTESUR Negative 09/03/2014 1200   Sepsis Labs Invalid input(s): PROCALCITONIN,  WBC,  LACTICIDVEN Microbiology Recent Results (from the past  240 hour(s))  Resp Panel by RT-PCR (Flu A&B, Covid) Nasopharyngeal Swab     Status: None   Collection Time: 07/21/21  7:54 PM   Specimen: Nasopharyngeal Swab; Nasopharyngeal(NP) swabs in vial transport medium  Result Value Ref Range Status   SARS Coronavirus 2 by RT PCR NEGATIVE NEGATIVE Final    Comment: (NOTE) SARS-CoV-2 target nucleic acids are NOT DETECTED.  The SARS-CoV-2 RNA is generally detectable in upper respiratory specimens during the acute phase of infection. The lowest concentration of SARS-CoV-2 viral copies this assay can detect is 138 copies/mL. A negative result does not preclude SARS-Cov-2 infection and should not be used as the sole basis for treatment or other patient management decisions. A negative result may occur with  improper specimen collection/handling, submission of specimen other than nasopharyngeal swab, presence of viral mutation(s) within the areas targeted by this assay, and inadequate number of viral copies(<138 copies/mL). A negative result must be combined with clinical observations, patient history, and epidemiological information. The expected result is Negative.  Fact Sheet for Patients:  EntrepreneurPulse.com.au  Fact Sheet for Healthcare Providers:  IncredibleEmployment.be  This test is no t yet approved or cleared by the [place] FDA and  has been authorized for detection and/or diagnosis of  SARS-CoV-2 by FDA under an Emergency Use Authorization (EUA). This EUA will remain  in effect (meaning this test can be used) for the duration of the COVID-19 declaration under Section 564(b)(1) of the Act, 21 U.S.C.section 360bbb-3(b)(1), unless the authorization is terminated  or revoked sooner.       Influenza A by PCR NEGATIVE NEGATIVE Final   Influenza B by PCR NEGATIVE NEGATIVE Final    Comment: (NOTE) The Xpert Xpress SARS-CoV-2/FLU/RSV plus assay is intended as an aid in the diagnosis of influenza from Nasopharyngeal swab specimens and should not be used as a sole basis for treatment. Nasal washings and aspirates are unacceptable for Xpert Xpress SARS-CoV-2/FLU/RSV testing.  Fact Sheet for Patients: EntrepreneurPulse.com.au  Fact Sheet for Healthcare Providers: IncredibleEmployment.be  This test is not yet approved or cleared by the [place] FDA and has been authorized for detection and/or diagnosis of SARS-CoV-2 by FDA under an Emergency Use Authorization (EUA). This EUA will remain in effect (meaning this test can be used) for the duration of the COVID-19 declaration under Section 564(b)(1) of the Act, 21 U.S.C. section 360bbb-3(b)(1), unless the authorization is terminated or revoked.  Performed at [facility], [address] [place], [place] [address]   Urine Culture     Status: None   Collection Time: 07/27/21  3:35 PM   Specimen: Urine, Catheterized  Result Value Ref Range Status   Specimen Description URINE, CATHETERIZED  Final   Special Requests NONE  Final   Culture   Final    NO GROWTH Performed at [facility], [address], [place], [place] [address]    Report Status 07/28/2021 FINAL  Final  Resp Panel by RT-PCR (Flu A&B, Covid) Nasopharyngeal Swab     Status: None   Collection Time: 07/28/21  7:25 PM   Specimen: Nasopharyngeal Swab; Nasopharyngeal(NP) swabs in vial transport medium  Result Value Ref  Range Status   SARS Coronavirus 2 by RT PCR NEGATIVE NEGATIVE Final    Comment: (NOTE) SARS-CoV-2 target nucleic acids are NOT DETECTED.  The SARS-CoV-2 RNA is generally detectable in upper respiratory specimens during the acute phase of infection. The lowest concentration of SARS-CoV-2 viral copies this assay can detect is 138 copies/mL. A negative  result does not preclude SARS-Cov-2 infection and should not be used as the sole basis for treatment or other patient management decisions. A negative result may occur with  improper specimen collection/handling, submission of specimen other than nasopharyngeal swab, presence of viral mutation(s) within the areas targeted by this assay, and inadequate number of viral copies(<138 copies/mL). A negative result must be combined with clinical observations, patient history, and epidemiological information. The expected result is Negative.  Fact Sheet for Patients:  EntrepreneurPulse.com.au  Fact Sheet for Healthcare Providers:  IncredibleEmployment.be  This test is no t yet approved or cleared by the [place] FDA and  has been authorized for detection and/or diagnosis of SARS-CoV-2 by FDA under an Emergency Use Authorization (EUA). This EUA will remain  in effect (meaning this test can be used) for the duration of the COVID-19 declaration under Section 564(b)(1) of the Act, 21 U.S.C.section 360bbb-3(b)(1), unless the authorization is terminated  or revoked sooner.       Influenza A by PCR NEGATIVE NEGATIVE Final   Influenza B by PCR NEGATIVE NEGATIVE Final    Comment: (NOTE) The Xpert Xpress SARS-CoV-2/FLU/RSV plus assay is intended as an aid in the diagnosis of influenza from Nasopharyngeal swab specimens and should not be used as a sole basis for treatment. Nasal washings and aspirates are unacceptable for Xpert Xpress SARS-CoV-2/FLU/RSV testing.  Fact Sheet for  Patients: EntrepreneurPulse.com.au  Fact Sheet for Healthcare Providers: IncredibleEmployment.be  This test is not yet approved or cleared by the [place] FDA and has been authorized for detection and/or diagnosis of SARS-CoV-2 by FDA under an Emergency Use Authorization (EUA). This EUA will remain in effect (meaning this test can be used) for the duration of the COVID-19 declaration under Section 564(b)(1) of the Act, 21 U.S.C. section 360bbb-3(b)(1), unless the authorization is terminated or revoked.  Performed at [facility], [place] [address] [place], [place] [address]      Time coordinating discharge: 35 minutes  SIGNED:   Aline August, MD  [facility] 07/30/2021, 9:30 AM

## 2021-07-30 NOTE — Progress Notes (Signed)
Ariana [name] from [place] [facility] wants a copy of discharge instructions faxed to [phone]. Her phone number is [phone].

## 2021-07-30 NOTE — Progress Notes (Signed)
Report given to Annasha at [facility].

## 2021-07-30 NOTE — NC FL2 (Signed)
[place] LEVEL OF CARE SCREENING TOOL     IDENTIFICATION  Patient Name: Ariana [name] Birthdate: [birth year] Sex: female Admission Date (Current Location): 07/21/2021  [facility] and [place] Number:  [employer] and Address:  [facility] [place]. [facility], [place] [address] [place], [place] [address]      Provider Number: [id number]  Attending Physician Name and Address:  Aline August, MD  Relative Name and Phone Number:  [place] Worker [place]    Current Level of Care: Hospital Recommended Level of Care: [place] Prior Approval Number:    Date Approved/Denied:   PASRR Number:    Discharge Plan: SNF    Current Diagnoses: Patient Active Problem List   Diagnosis Date Noted   Protein-calorie malnutrition, severe 07/24/2021   Hypokalemia 07/22/2021   Hyperglycemia 07/22/2021   Prolonged QT interval 07/22/2021   Fracture of femoral neck, right ([place]) 07/21/2021   Cerebral hemorrhage ([place]) 07/14/2021   Subarachnoid hemorrhage ([place]) 07/13/2021   Hiatal hernia 04/09/2021   GERD (gastroesophageal reflux disease) 04/09/2021   Normocytic anemia    Skin tear of right hand without complication    Fall    Malnutrition of moderate degree 01/09/2021   Palliative care encounter    Closed displaced fracture of left femoral neck ([place]) 10/30/2020   Rectal bleeding 08/18/2020   Leukocytosis 08/18/2020   Status post reverse total shoulder replacement, left 06/25/2020   Rotator cuff arthropathy, left 06/24/2020   Failure to thrive in adult 02/16/2020   Thrombocytopenia ([place]) 02/15/2020   Pneumonia 07/10/2019   Laceration of left lower leg 04/10/2019   Chronic pain disorder 01/11/2019   OSA (obstructive sleep apnea) 09/28/2018   Ulcer of lower limb, right, limited to breakdown of skin ([place]) 07/22/2018   Hematoma of lower limb, right, initial encounter 07/18/2018   Traumatic hematoma of right lower leg 07/12/2018    Asthma without status asthmaticus 11/25/2016   Late onset Alzheimer's disease without behavioral disturbance ([place]) 12/30/2015   Back muscle spasm 09/09/2015   Clinical depression 04/12/2015   Cellulitis 04/12/2015   Acute ITP ([place]) 03/15/2015   Chest pain 10/22/2014   Breathlessness on exertion 10/22/2014   Osteopenia 10/18/2014   Essential hypertension 06/28/2014   HLD (hyperlipidemia) 06/28/2014   Idiopathic thrombocythemia ([place]) 06/28/2014   Acquired hypothyroidism 06/04/2014   Depressive disorder 06/04/2014   Bursitis, trochanteric 03/27/2014   DDD (degenerative disc disease), lumbar 02/27/2014   Neuritis or radiculitis due to rupture of lumbar intervertebral disc 02/27/2014   Lumbar radiculitis 02/27/2014    Orientation RESPIRATION BLADDER Height & Weight     Self  Normal External catheter, Incontinent Weight: 132 lb 4.4 oz (60 kg) Height:  5\' 7"  (170.2 cm)  BEHAVIORAL SYMPTOMS/MOOD NEUROLOGICAL BOWEL NUTRITION STATUS      Incontinent Diet (please see discharge summary)  AMBULATORY STATUS COMMUNICATION OF NEEDS Skin   Extensive Assist Verbally Surgical wounds (closed incsision RT hip)                       Personal Care Assistance Level of Assistance  Bathing, Feeding, Dressing Bathing Assistance: Maximum assistance Feeding assistance: Limited assistance Dressing Assistance: Maximum assistance     Functional Limitations Info  Sight, Speech, Hearing          SPECIAL CARE FACTORS FREQUENCY  PT (By licensed PT), OT (By licensed OT)     PT Frequency: 5x per week OT Frequency: 5x per week  Contractures Contractures Info: Not present    Additional Factors Info  Code Status, Allergies Code Status Info: DNR Allergies Info: Aspirin,Diazepam,Tetanus Toxoids,Morphine           Current Medications (07/30/2021):  This is the current hospital active medication list Current Facility-Administered Medications  Medication Dose Route Frequency  Provider Last Rate Last Admin   0.9 %  sodium chloride infusion (Manually program via Guardrails IV Fluids)   Intravenous Once Leandrew Koyanagi, MD       acetaminophen (TYLENOL) tablet 325-650 mg  325-650 mg Oral Q6H PRN Leandrew Koyanagi, MD       ALPRAZolam Duanne Moron) tablet 0.5 mg  0.5 mg Oral TID PRN Leandrew Koyanagi, MD   0.5 mg at 07/29/21 1031   alum & mag hydroxide-simeth (MAALOX/MYLANTA) 200-200-20 MG/5ML suspension 30 mL  30 mL Oral Q4H PRN Leandrew Koyanagi, MD       amLODipine (NORVASC) tablet 10 mg  10 mg Oral Daily Leandrew Koyanagi, MD   10 mg at 07/30/21 1026   Chlorhexidine Gluconate Cloth 2 % PADS 6 each  6 each Topical Daily Kathryne Eriksson, NP   6 each at 07/29/21 1031   feeding supplement (ENSURE ENLIVE / ENSURE PLUS) liquid 237 mL  237 mL Oral BID BM Leandrew Koyanagi, MD   237 mL at 07/29/21 1500   fentaNYL (SUBLIMAZE) injection 25 mcg  25 mcg Intravenous Q2H PRN Leandrew Koyanagi, MD   25 mcg at 07/24/21 1734   HYDROmorphone (DILAUDID) injection 0.5-1 mg  0.5-1 mg Intravenous Q4H PRN Leandrew Koyanagi, MD   1 mg at 07/25/21 1609   levothyroxine (SYNTHROID) tablet 50 mcg  50 mcg Oral QAC breakfast Leandrew Koyanagi, MD   50 mcg at 07/30/21 2458   MEDLINE mouth rinse  15 mL Mouth Rinse BID Wendee Beavers T, MD   15 mL at 07/29/21 2112   memantine (NAMENDA) tablet 10 mg  10 mg Oral BID Leandrew Koyanagi, MD   10 mg at 07/30/21 1027   menthol-cetylpyridinium (CEPACOL) lozenge 3 mg  1 lozenge Oral PRN Leandrew Koyanagi, MD       Or   phenol (CHLORASEPTIC) mouth spray 1 spray  1 spray Mouth/Throat PRN Leandrew Koyanagi, MD       methocarbamol (ROBAXIN) tablet 500 mg  500 mg Oral Q6H PRN Leandrew Koyanagi, MD       Or   methocarbamol (ROBAXIN) 500 mg in dextrose 5 % 50 mL IVPB  500 mg Intravenous Q6H PRN Leandrew Koyanagi, MD       multivitamin with minerals tablet 1 tablet  1 tablet Oral Daily Leandrew Koyanagi, MD   1 tablet at 07/30/21 1027   ondansetron (ZOFRAN) tablet 4 mg  4 mg Oral Q6H PRN Leandrew Koyanagi, MD       Or   ondansetron  [facility]) injection 4 mg  4 mg Intravenous Q6H PRN Leandrew Koyanagi, MD       oxyCODONE (Oxy IR/ROXICODONE) immediate release tablet 10-15 mg  10-15 mg Oral Q4H PRN Leandrew Koyanagi, MD       oxyCODONE (Oxy IR/ROXICODONE) immediate release tablet 5-10 mg  5-10 mg Oral Q4H PRN Leandrew Koyanagi, MD   5 mg at 07/30/21 1027   pantoprazole (PROTONIX) EC tablet 40 mg  40 mg Oral Daily Wendee Beavers T, MD   40 mg at 07/30/21 1026   polyethylene glycol (MIRALAX / GLYCOLAX) packet 17  g  17 g Oral BID PRN Wendee Beavers T, MD       rosuvastatin (CRESTOR) tablet 5 mg  5 mg Oral QPM Leandrew Koyanagi, MD   5 mg at 07/29/21 1804   senna-docusate (Senokot-S) tablet 1 tablet  1 tablet Oral Daily Wendee Beavers T, MD   1 tablet at 07/30/21 1027   sorbitol 70 % solution 30 mL  30 mL Oral Daily PRN Leandrew Koyanagi, MD         Discharge Medications: Please see discharge summary for a list of discharge medications.  Relevant Imaging Results:  Relevant Lab Results:   Additional Information SSN   [id number]  Vinie Sill, LCSW

## 2021-07-30 NOTE — Progress Notes (Signed)
Pt with discharge orders, report called to [facility]. IV's removed. Pt picked up and transported via [facility].

## 2021-07-30 NOTE — Progress Notes (Addendum)
Attempted to call report to [phone] at [phone].

## 2021-07-31 DIAGNOSIS — S72001D Fracture of unspecified part of neck of right femur, subsequent encounter for closed fracture with routine healing: Secondary | ICD-10-CM | POA: Diagnosis not present

## 2021-07-31 DIAGNOSIS — I10 Essential (primary) hypertension: Secondary | ICD-10-CM | POA: Diagnosis not present

## 2021-07-31 DIAGNOSIS — E039 Hypothyroidism, unspecified: Secondary | ICD-10-CM | POA: Diagnosis not present

## 2021-07-31 DIAGNOSIS — K219 Gastro-esophageal reflux disease without esophagitis: Secondary | ICD-10-CM | POA: Diagnosis not present

## 2021-07-31 DIAGNOSIS — F419 Anxiety disorder, unspecified: Secondary | ICD-10-CM | POA: Diagnosis not present

## 2021-07-31 DIAGNOSIS — G629 Polyneuropathy, unspecified: Secondary | ICD-10-CM | POA: Diagnosis not present

## 2021-07-31 DIAGNOSIS — R52 Pain, unspecified: Secondary | ICD-10-CM | POA: Diagnosis not present

## 2021-07-31 DIAGNOSIS — I609 Nontraumatic subarachnoid hemorrhage, unspecified: Secondary | ICD-10-CM | POA: Diagnosis not present

## 2021-07-31 LAB — PLATELET BY CITRATE

## 2021-08-01 DIAGNOSIS — I609 Nontraumatic subarachnoid hemorrhage, unspecified: Secondary | ICD-10-CM | POA: Diagnosis not present

## 2021-08-04 DIAGNOSIS — M87252 Osteonecrosis due to previous trauma, left femur: Secondary | ICD-10-CM | POA: Diagnosis not present

## 2021-08-04 DIAGNOSIS — R52 Pain, unspecified: Secondary | ICD-10-CM | POA: Diagnosis not present

## 2021-08-04 DIAGNOSIS — S72001D Fracture of unspecified part of neck of right femur, subsequent encounter for closed fracture with routine healing: Secondary | ICD-10-CM | POA: Diagnosis not present

## 2021-08-04 DIAGNOSIS — F32A Depression, unspecified: Secondary | ICD-10-CM | POA: Diagnosis not present

## 2021-08-04 DIAGNOSIS — F0390 Unspecified dementia without behavioral disturbance: Secondary | ICD-10-CM | POA: Diagnosis not present

## 2021-08-04 DIAGNOSIS — F419 Anxiety disorder, unspecified: Secondary | ICD-10-CM | POA: Diagnosis not present

## 2021-08-04 DIAGNOSIS — G629 Polyneuropathy, unspecified: Secondary | ICD-10-CM | POA: Diagnosis not present

## 2021-08-04 DIAGNOSIS — Z79899 Other long term (current) drug therapy: Secondary | ICD-10-CM | POA: Diagnosis not present

## 2021-08-04 DIAGNOSIS — M87052 Idiopathic aseptic necrosis of left femur: Secondary | ICD-10-CM | POA: Diagnosis not present

## 2021-08-04 DIAGNOSIS — R5381 Other malaise: Secondary | ICD-10-CM | POA: Diagnosis not present

## 2021-08-11 DIAGNOSIS — J441 Chronic obstructive pulmonary disease with (acute) exacerbation: Secondary | ICD-10-CM | POA: Diagnosis not present

## 2021-08-13 ENCOUNTER — Ambulatory Visit (INDEPENDENT_AMBULATORY_CARE_PROVIDER_SITE_OTHER): Payer: Medicare HMO | Admitting: Orthopaedic Surgery

## 2021-08-13 ENCOUNTER — Inpatient Hospital Stay: Payer: Medicare HMO

## 2021-08-13 ENCOUNTER — Encounter: Payer: Self-pay | Admitting: Oncology

## 2021-08-13 ENCOUNTER — Encounter: Payer: Self-pay | Admitting: Orthopaedic Surgery

## 2021-08-13 ENCOUNTER — Ambulatory Visit: Payer: Self-pay

## 2021-08-13 ENCOUNTER — Inpatient Hospital Stay: Payer: Medicare HMO | Attending: Oncology | Admitting: Oncology

## 2021-08-13 ENCOUNTER — Other Ambulatory Visit: Payer: Self-pay

## 2021-08-13 VITALS — BP 132/67 | HR 59 | Temp 97.9°F | Resp 18 | Wt 125.0 lb

## 2021-08-13 DIAGNOSIS — D693 Immune thrombocytopenic purpura: Secondary | ICD-10-CM

## 2021-08-13 DIAGNOSIS — S72001A Fracture of unspecified part of neck of right femur, initial encounter for closed fracture: Secondary | ICD-10-CM | POA: Diagnosis not present

## 2021-08-13 DIAGNOSIS — M25551 Pain in right hip: Secondary | ICD-10-CM | POA: Diagnosis not present

## 2021-08-13 LAB — CBC WITH DIFFERENTIAL/PLATELET
Abs Immature Granulocytes: 0.04 10*3/uL (ref 0.00–0.07)
Basophils Absolute: 0.1 10*3/uL (ref 0.0–0.1)
Basophils Relative: 1 %
Eosinophils Absolute: 0.3 10*3/uL (ref 0.0–0.5)
Eosinophils Relative: 3 %
HCT: 38.7 % (ref 36.0–46.0)
Hemoglobin: 12.6 g/dL (ref 12.0–15.0)
Immature Granulocytes: 0 %
Lymphocytes Relative: 34 %
Lymphs Abs: 3.7 10*3/uL (ref 0.7–4.0)
MCH: 30.8 pg (ref 26.0–34.0)
MCHC: 32.6 g/dL (ref 30.0–36.0)
MCV: 94.6 fL (ref 80.0–100.0)
Monocytes Absolute: 0.8 10*3/uL (ref 0.1–1.0)
Monocytes Relative: 7 %
Neutro Abs: 5.9 10*3/uL (ref 1.7–7.7)
Neutrophils Relative %: 55 %
Platelets: 15 10*3/uL — CL (ref 150–400)
RBC: 4.09 MIL/uL (ref 3.87–5.11)
RDW: 17.2 % — ABNORMAL HIGH (ref 11.5–15.5)
Smear Review: NORMAL
WBC: 10.8 10*3/uL — ABNORMAL HIGH (ref 4.0–10.5)
nRBC: 0 % (ref 0.0–0.2)

## 2021-08-13 MED ORDER — ROMIPLOSTIM INJECTION 500 MCG
560.0000 ug | Freq: Once | SUBCUTANEOUS | Status: AC
  Administered 2021-08-13: 560 ug via SUBCUTANEOUS
  Filled 2021-08-13: qty 1.12

## 2021-08-13 NOTE — Progress Notes (Signed)
Post-Op Visit Note   Patient: Ariana [name]           Date of Birth: [birth year]           MRN: [id number] Visit Date: 08/13/2021 PCP: Caprice Renshaw, MD   Assessment & Plan:  Chief Complaint:  Chief Complaint  Patient presents with   Right Hip - Pain   Visit Diagnoses:  1. Pain in right hip     Plan: Ariana [name] is 87 year old female 2 weeks status post cannulated right hip pinning.  She has advanced dementia.  Overall doing okay.  She is essentially nonambulatory at baseline.  Gentle range of motion of the right hip produces no pain.  The surgical incision is healed and does not show any evidence of infection.  X-rays demonstrate stable fixation of the fracture without any complications.  At this point we will advance her to weight-bear as tolerated to the right lower extremity.  She is to continue physical therapy at her facility.  Recheck in 4 weeks with two-view x-rays of the right hip.  Follow-Up Instructions: Return in about 4 weeks (around 09/10/2021).   Orders:  Orders Placed This Encounter  Procedures   XR HIP UNILAT W OR W/O PELVIS 2-3 VIEWS RIGHT   No orders of the defined types were placed in this encounter.   Imaging: XR HIP UNILAT W OR W/O PELVIS 2-3 VIEWS RIGHT  Result Date: 08/13/2021 Stable fixation alignment of subcapital right femoral neck fracture   PMFS History: Patient Active Problem List   Diagnosis Date Noted   Protein-calorie malnutrition, severe 07/24/2021   Hypokalemia 07/22/2021   Hyperglycemia 07/22/2021   Prolonged QT interval 07/22/2021   Fracture of femoral neck, right ([place]) 07/21/2021   Cerebral hemorrhage ([place]) 07/14/2021   Subarachnoid hemorrhage ([employer]) 07/13/2021   Hiatal hernia 04/09/2021   GERD (gastroesophageal reflux disease) 04/09/2021   Normocytic anemia    Skin tear of right hand without complication    Fall    Malnutrition of moderate degree 01/09/2021   Palliative care encounter    Closed displaced fracture of left  femoral neck ([place]) 10/30/2020   Rectal bleeding 08/18/2020   Leukocytosis 08/18/2020   Status post reverse total shoulder replacement, left 06/25/2020   Rotator cuff arthropathy, left 06/24/2020   Failure to thrive in adult 02/16/2020   Thrombocytopenia ([place]) 02/15/2020   Pneumonia 07/10/2019   Laceration of left lower leg 04/10/2019   Chronic pain disorder 01/11/2019   OSA (obstructive sleep apnea) 09/28/2018   Ulcer of lower limb, right, limited to breakdown of skin ([place]) 07/22/2018   Hematoma of lower limb, right, initial encounter 07/18/2018   Traumatic hematoma of right lower leg 07/12/2018   Asthma without status asthmaticus 11/25/2016   Late onset Alzheimer's disease without behavioral disturbance ([place]) 12/30/2015   Back muscle spasm 09/09/2015   Clinical depression 04/12/2015   Cellulitis 04/12/2015   Acute ITP ([place]) 03/15/2015   Chest pain 10/22/2014   Breathlessness on exertion 10/22/2014   Osteopenia 10/18/2014   Essential hypertension 06/28/2014   HLD (hyperlipidemia) 06/28/2014   Idiopathic thrombocythemia ([place]) 06/28/2014   Acquired hypothyroidism 06/04/2014   Depressive disorder 06/04/2014   Bursitis, trochanteric 03/27/2014   DDD (degenerative disc disease), lumbar 02/27/2014   Neuritis or radiculitis due to rupture of lumbar intervertebral disc 02/27/2014   Lumbar radiculitis 02/27/2014   Past Medical History:  Diagnosis Date   Alzheimer disease ([place])    Anemia    Anxiety    Asthma    WELL  CONTROLLED   Chronic back pain    Cognitive communication deficit    COPD (chronic obstructive pulmonary disease) (HCC)    Depression    Depression    GERD (gastroesophageal reflux disease)    Hiatal hernia    Hypercholesteremia    Hypothyroidism    Iron deficiency    ITP (idiopathic thrombocytopenic purpura)    FOLLOWED BR DR Grayland Ormond   LBBB (left bundle branch block)    Osteoarthritis    Osteoporosis    Restless legs     Family History  Problem Relation  Age of Onset   Stroke Mother     Past Surgical History:  Procedure Laterality Date   CARPAL TUNNEL RELEASE     ESOPHAGOGASTRODUODENOSCOPY (EGD) WITH PROPOFOL N/A 09/10/2015   Procedure: ESOPHAGOGASTRODUODENOSCOPY (EGD) WITH PROPOFOL;  Surgeon: Lollie Sails, MD;  Location: [facility] ENDOSCOPY;  Service: Endoscopy;  Laterality: N/A;   HIP PINNING,CANNULATED Left 10/31/2020   Procedure: CANNULATED HIP PINNING;  Surgeon: Leim Fabry, MD;  Location: [facility] ORS;  Service: Orthopedics;  Laterality: Left;   HIP PINNING,CANNULATED Right 07/24/2021   Procedure: CANNULATED HIP PINNING;  Surgeon: Leandrew Koyanagi, MD;  Location: [facility];  Service: Orthopedics;  Laterality: Right;   IRRIGATION AND DEBRIDEMENT HEMATOMA Right 07/13/2018   Procedure: IRRIGATION AND DEBRIDEMENT HEMATOMA-RIGHT SHIN;  Surgeon: Herbert Pun, MD;  Location: [facility] ORS;  Service: General;  Laterality: Right;   KYPHOPLASTY N/A 05/02/2019   Procedure: L1 KYPHOPLASTY;  Surgeon: Hessie Knows, MD;  Location: [facility] ORS;  Service: Orthopedics;  Laterality: N/A;   LUMBAR DISC SURGERY     REVERSE SHOULDER ARTHROPLASTY Left 06/25/2020   Procedure: REVERSE SHOULDER ARTHROPLASTY;  Surgeon: Corky Mull, MD;  Location: [facility] ORS;  Service: Orthopedics;  Laterality: Left;   SPLENECTOMY, PARTIAL     TOTAL ABDOMINAL HYSTERECTOMY     Social History   Occupational History   Not on file  Tobacco Use   Smoking status: Never   Smokeless tobacco: Never  Vaping Use   Vaping Use: Never used  Substance and Sexual Activity   Alcohol use: No    Alcohol/week: 0.0 standard drinks   Drug use: No   Sexual activity: Not on file

## 2021-08-13 NOTE — Progress Notes (Signed)
[facility] [place]  [phone]  [phone]     Ariana [name] DOB: [birth year]  MR#: [id number]  STM#:[id number]  Patient Care Team: Ariana Renshaw, MD as PCP - General (Internal Medicine) Ariana Huger, MD as Consulting Physician (Oncology)   CHIEF COMPLAINT: Chronic refractory ITP  INTERVAL HISTORY: Ariana [name] is a 87 year old female with multiple medical problems including hyperlipidemia, depression, OSA, asthma, hypothyroidism who is followed Dr. Grayland [name] for chronic refractory ITP currently on Nplate.  She has baseline dementia and has a caregiver that presents with her today.  She has chronic weakness and fatigue and uses a wheelchair.   Patient was hospitalized from 07/21/2021 - 07/30/2021 for fall injuring causing right hip fracture.  She required IVIG and Nplate while hospitalized due to thrombocytopenia.  She also received a unit of platelets.  Underwent surgical intervention of right femoral neck fracture on 07/24/2021 by orthopedics and was discharged to a SNF.  She continues to recover.  No falls since.  Today, she reports doing well.  Denies any new concerns at this time.  Caregiver states her appetite is low.  Denies any bleeding.  REVIEW OF SYSTEMS:   Review of Systems  Constitutional:  Positive for malaise/fatigue and weight loss. Negative for chills and fever.  HENT:  Negative for congestion, ear pain and tinnitus.   Eyes: Negative.  Negative for blurred vision and double vision.  Respiratory: Negative.  Negative for cough, sputum production and shortness of breath.   Cardiovascular: Negative.  Negative for chest pain, palpitations and leg swelling.  Gastrointestinal: Negative.  Negative for abdominal pain, constipation, diarrhea, nausea and vomiting.  Genitourinary:  Negative for dysuria, frequency and urgency.  Musculoskeletal:  Positive for falls and joint pain. Negative for back pain.  Skin: Negative.  Negative for rash.   Neurological:  Positive for weakness. Negative for headaches.  Endo/Heme/Allergies: Negative.  Does not bruise/bleed easily.  Psychiatric/Behavioral:  Positive for memory loss. Negative for depression. The patient is not nervous/anxious and does not have insomnia.    As per HPI. Otherwise, a complete review of systems is negative.  PAST MEDICAL HISTORY: Past Medical History:  Diagnosis Date   Alzheimer disease ([place])    Anemia    Anxiety    Asthma    WELL CONTROLLED   Chronic back pain    Cognitive communication deficit    COPD (chronic obstructive pulmonary disease) (HCC)    Depression    Depression    GERD (gastroesophageal reflux disease)    Hiatal hernia    Hypercholesteremia    Hypothyroidism    Iron deficiency    ITP (idiopathic thrombocytopenic purpura)    FOLLOWED BR DR Ariana [name]   LBBB (left bundle branch block)    Osteoarthritis    Osteoporosis    Restless legs     PAST SURGICAL HISTORY: Past Surgical History:  Procedure Laterality Date   CARPAL TUNNEL RELEASE     ESOPHAGOGASTRODUODENOSCOPY (EGD) WITH PROPOFOL N/A 09/10/2015   Procedure: ESOPHAGOGASTRODUODENOSCOPY (EGD) WITH PROPOFOL;  Surgeon: Ariana Sails, MD;  Location: [facility] ENDOSCOPY;  Service: Endoscopy;  Laterality: N/A;   HIP PINNING,CANNULATED Left 10/31/2020   Procedure: CANNULATED HIP PINNING;  Surgeon: Ariana Fabry, MD;  Location: [facility] ORS;  Service: Orthopedics;  Laterality: Left;   HIP PINNING,CANNULATED Right 07/24/2021   Procedure: CANNULATED HIP PINNING;  Surgeon: Ariana Koyanagi, MD;  Location: [facility];  Service: Orthopedics;  Laterality: Right;   IRRIGATION AND DEBRIDEMENT HEMATOMA Right 07/13/2018   Procedure: IRRIGATION AND  DEBRIDEMENT HEMATOMA-RIGHT SHIN;  Surgeon: Ariana Pun, MD;  Location: [facility] ORS;  Service: General;  Laterality: Right;   KYPHOPLASTY N/A 05/02/2019   Procedure: L1 KYPHOPLASTY;  Surgeon: Ariana Knows, MD;  Location: [facility] ORS;  Service: Orthopedics;  Laterality:  N/A;   LUMBAR DISC SURGERY     REVERSE SHOULDER ARTHROPLASTY Left 06/25/2020   Procedure: REVERSE SHOULDER ARTHROPLASTY;  Surgeon: Ariana Mull, MD;  Location: [facility] ORS;  Service: Orthopedics;  Laterality: Left;   SPLENECTOMY, PARTIAL     TOTAL ABDOMINAL HYSTERECTOMY      FAMILY HISTORY Family History  Problem Relation Age of Onset   Stroke Mother     GYNECOLOGIC HISTORY:  No LMP recorded. Patient has had a hysterectomy.     ADVANCED DIRECTIVES:    HEALTH MAINTENANCE: Social History   Tobacco Use   Smoking status: Never   Smokeless tobacco: Never  Vaping Use   Vaping Use: Never used  Substance Use Topics   Alcohol use: No    Alcohol/week: 0.0 standard drinks   Drug use: No     Allergies  Allergen Reactions   Aspirin Other (See Comments)    Upset stomach   Diazepam Itching    sneezing   Tetanus Toxoids Swelling    Localized superficial swelling of skin   Morphine Itching and Rash    Current Outpatient Medications  Medication Sig Dispense Refill   acetaminophen (TYLENOL) 325 MG tablet Take 2 tablets (650 mg total) by mouth every 6 (six) hours as needed for mild pain (or Fever >/= 101). 100 tablet 0   ALPRAZolam (XANAX) 0.5 MG tablet Take 1 tablet (0.5 mg total) by mouth 3 (three) times daily as needed for anxiety. 10 tablet 0   amLODipine (NORVASC) 10 MG tablet Take 1 tablet (10 mg total) by mouth daily.     busPIRone (BUSPAR) 15 MG tablet Take 7.5 mg by mouth 2 (two) times daily.     Cholecalciferol (VITAMIN D) 50 MCG (2000 UT) tablet Take 2,000 Units by mouth daily.     citalopram (CELEXA) 20 MG tablet Take 1 tablet (20 mg total) by mouth every morning.     Cyanocobalamin 1000 MCG TBCR Take 1,000 mcg by mouth daily.      diclofenac Sodium (VOLTAREN) 1 % GEL Apply 2 g topically in the morning and at bedtime. Both knees     Fluticasone-Salmeterol (ADVAIR) 250-50 MCG/DOSE AEPB Inhale 1 puff into the lungs 2 (two) times daily as needed (shortness of breath).      HYDROcodone-acetaminophen (NORCO) 7.5-325 MG tablet Take 1-2 tablets by mouth 3 (three) times daily as needed for moderate pain. 30 tablet 0   iron polysaccharides (NIFEREX) 150 MG capsule Take 1 capsule (150 mg total) by mouth daily.     lamoTRIgine (LAMICTAL) 25 MG tablet Take 50 mg by mouth daily.     levothyroxine (SYNTHROID, LEVOTHROID) 50 MCG tablet Take 50 mcg by mouth daily before breakfast. Take 30 to 60 minutes before breakfast.     loratadine (CLARITIN) 10 MG tablet Take 1 tablet (10 mg total) by mouth daily.     memantine (NAMENDA) 10 MG tablet Take 10 mg by mouth 2 (two) times daily.     methocarbamol (ROBAXIN) 500 MG tablet Take 1 tablet (500 mg total) by mouth every 6 (six) hours as needed for muscle spasms. 30 tablet 0   Multiple Vitamin (MULTI-VITAMIN) tablet Take 1 tablet by mouth daily.     oxybutynin (DITROPAN-XL) 5 MG 24 hr tablet  Take 5 mg by mouth at bedtime.     pantoprazole (PROTONIX) 40 MG tablet Take 40 mg by mouth daily.     polyethylene glycol (MIRALAX / GLYCOLAX) 17 g packet Take 17 g by mouth daily as needed for moderate constipation. 14 each 0   rosuvastatin (CRESTOR) 5 MG tablet Take 5 mg by mouth every evening.     senna-docusate (SENOKOT-S) 8.6-50 MG tablet Take 1 tablet by mouth 2 (two) times daily. 20 tablet 0   No current facility-administered medications for this visit.    OBJECTIVE: BP 132/67 (BP Location: Left Arm, Patient Position: Sitting)   Pulse (!) 59   Temp 97.9 F (36.6 C) (Tympanic)   Resp 18   Wt 125 lb (56.7 kg)   SpO2 99%   BMI 19.58 kg/m    Body mass index is 19.58 kg/m.    ECOG FS:2 - Symptomatic, <50% confined to bed  Physical Exam Constitutional:      Appearance: Normal appearance.  HENT:     Head: Normocephalic and atraumatic.  Eyes:     Pupils: Pupils are equal, round, and reactive to light.  Cardiovascular:     Rate and Rhythm: Normal rate and regular rhythm.     Heart sounds: Normal heart sounds. No murmur  heard. Pulmonary:     Effort: Pulmonary effort is normal.     Breath sounds: Normal breath sounds. No wheezing.  Abdominal:     General: Bowel sounds are normal. There is no distension.     Palpations: Abdomen is soft.     Tenderness: There is no abdominal tenderness.  Musculoskeletal:        General: Normal range of motion.     Cervical back: Normal range of motion.  Skin:    General: Skin is warm and dry.     Coloration: Skin is pale.     Findings: No rash.  Neurological:     Mental Status: She is alert and oriented to person, place, and time.  Psychiatric:        Judgment: Judgment normal.    LAB RESULTS:  Appointment on 08/13/2021  Component Date Value Ref Range Status   WBC 08/13/2021 10.8 (A)  4.0 - 10.5 K/uL Final   RBC 08/13/2021 4.09  3.87 - 5.11 MIL/uL Final   Hemoglobin 08/13/2021 12.6  12.0 - 15.0 g/dL Final   HCT 08/13/2021 38.7  36.0 - 46.0 % Final   MCV 08/13/2021 94.6  80.0 - 100.0 fL Final   MCH 08/13/2021 30.8  26.0 - 34.0 pg Final   MCHC 08/13/2021 32.6  30.0 - 36.0 g/dL Final   RDW 08/13/2021 17.2 (A)  11.5 - 15.5 % Final   Platelets 08/13/2021 15 (A)  150 - 400 K/uL Final   This critical result has verified and been called to DR.FINNEGAN by Finis Bud on 11 02 2022 at 1356, and has been read back.    nRBC 08/13/2021 0.0  0.0 - 0.2 % Final   Neutrophils Relative % 08/13/2021 55  % Final   Neutro Abs 08/13/2021 5.9  1.7 - 7.7 K/uL Final   Lymphocytes Relative 08/13/2021 34  % Final   Lymphs Abs 08/13/2021 3.7  0.7 - 4.0 K/uL Final   Monocytes Relative 08/13/2021 7  % Final   Monocytes Absolute 08/13/2021 0.8  0.1 - 1.0 K/uL Final   Eosinophils Relative 08/13/2021 3  % Final   Eosinophils Absolute 08/13/2021 0.3  0.0 - 0.5 K/uL Final   Basophils  Relative 08/13/2021 1  % Final   Basophils Absolute 08/13/2021 0.1  0.0 - 0.1 K/uL Final   WBC Morphology 08/13/2021 MORPHOLOGY UNREMARKABLE   Final   RBC Morphology 08/13/2021 MORPHOLOGY UNREMARKABLE   Final    Smear Review 08/13/2021 Normal platelet morphology   Final   Comment: PLATELETS APPEAR DECREASED PLATELET COUNT CONFIRMED BY SMEAR    Immature Granulocytes 08/13/2021 0  % Final   Abs Immature Granulocytes 08/13/2021 0.04  0.00 - 0.07 K/uL Final   Performed at [facility], [place], [place] [address]    STUDIES: XR HIP UNILAT W OR W/O PELVIS 2-3 VIEWS RIGHT  Result Date: 08/13/2021 Stable fixation alignment of subcapital right femoral neck fracture   ASSESSMENT:  Chronic refractory ITP  PLAN:   1. Chronic refractory ITP-  Patient is status post splenectomy in 2007.  Has had poor response to prednisone and Rituxan.  She received IVIG and Nplate while in the hospital with improvement of her platelet count.  She receives monthly Nplate since September 2021.  If Nplate is no longer effective, Dr. Grayland [name] recommends retrial of IVIG.  She is currently receiving the max dose of Nplate at 10 mcg/kg.  Additional treatment options include Promacta or Tavalisse.  Labs from today show platelet count of 15,000.  Proceed with Nplate today.  Return to clinic in 4 weeks for labs, see Dr. Grayland [name] and possible Nplate.  2.  Recent hip fracture- Underwent ORIF by orthopedics on 07/24/2021.  Required IVIG and Nplate prior to surgery.  She was discharged to SNF and needs to recover.  3.  Dementia:  Chronic and unchanged.  Patient is now on a full-time memory unit.  Her husband is no longer involved in her care.  Disposition- Return to clinic in 1 month for lab work, see Dr. Grayland [name] and possible Nplate.  I spent 20 minutes dedicated to the care of this patient (face-to-face and non-face-to-face) on the date of the encounter to include what is described in the assessment and plan.  Patient expressed understanding and was in agreement with this plan. She also understands that She can call clinic at any time with any questions, concerns, or complaints.    Jacquelin Hawking, NP    08/13/2021 3:17 PM

## 2021-08-13 NOTE — Progress Notes (Signed)
Not much appetite. High falls risk. No bruising. Denies any nose bleeds. No blood noted in stools. Has care provider from facility with her today.

## 2021-08-14 DIAGNOSIS — F32A Depression, unspecified: Secondary | ICD-10-CM | POA: Diagnosis not present

## 2021-08-14 DIAGNOSIS — F419 Anxiety disorder, unspecified: Secondary | ICD-10-CM | POA: Diagnosis not present

## 2021-08-14 DIAGNOSIS — S72001D Fracture of unspecified part of neck of right femur, subsequent encounter for closed fracture with routine healing: Secondary | ICD-10-CM | POA: Diagnosis not present

## 2021-08-14 DIAGNOSIS — R52 Pain, unspecified: Secondary | ICD-10-CM | POA: Diagnosis not present

## 2021-08-14 DIAGNOSIS — I10 Essential (primary) hypertension: Secondary | ICD-10-CM | POA: Diagnosis not present

## 2021-08-14 DIAGNOSIS — F0390 Unspecified dementia without behavioral disturbance: Secondary | ICD-10-CM | POA: Diagnosis not present

## 2021-08-14 DIAGNOSIS — Z79899 Other long term (current) drug therapy: Secondary | ICD-10-CM | POA: Diagnosis not present

## 2021-08-18 DIAGNOSIS — Z79899 Other long term (current) drug therapy: Secondary | ICD-10-CM | POA: Diagnosis not present

## 2021-08-18 DIAGNOSIS — R52 Pain, unspecified: Secondary | ICD-10-CM | POA: Diagnosis not present

## 2021-08-18 DIAGNOSIS — I609 Nontraumatic subarachnoid hemorrhage, unspecified: Secondary | ICD-10-CM | POA: Diagnosis not present

## 2021-08-18 DIAGNOSIS — F32A Depression, unspecified: Secondary | ICD-10-CM | POA: Diagnosis not present

## 2021-08-18 DIAGNOSIS — I10 Essential (primary) hypertension: Secondary | ICD-10-CM | POA: Diagnosis not present

## 2021-08-18 DIAGNOSIS — S72001D Fracture of unspecified part of neck of right femur, subsequent encounter for closed fracture with routine healing: Secondary | ICD-10-CM | POA: Diagnosis not present

## 2021-08-18 DIAGNOSIS — G629 Polyneuropathy, unspecified: Secondary | ICD-10-CM | POA: Diagnosis not present

## 2021-08-18 DIAGNOSIS — F419 Anxiety disorder, unspecified: Secondary | ICD-10-CM | POA: Diagnosis not present

## 2021-08-24 ENCOUNTER — Emergency Department (HOSPITAL_COMMUNITY): Payer: Medicare HMO

## 2021-08-24 ENCOUNTER — Emergency Department (HOSPITAL_COMMUNITY)
Admission: EM | Admit: 2021-08-24 | Discharge: 2021-08-25 | Disposition: A | Discharge status: Skilled Nursing Facility | Payer: Medicare HMO | Attending: Emergency Medicine | Admitting: Emergency Medicine

## 2021-08-24 ENCOUNTER — Encounter (HOSPITAL_COMMUNITY): Payer: Self-pay | Admitting: Emergency Medicine

## 2021-08-24 DIAGNOSIS — W19XXXA Unspecified fall, initial encounter: Secondary | ICD-10-CM | POA: Diagnosis not present

## 2021-08-24 DIAGNOSIS — Z96611 Presence of right artificial shoulder joint: Secondary | ICD-10-CM | POA: Insufficient documentation

## 2021-08-24 DIAGNOSIS — J449 Chronic obstructive pulmonary disease, unspecified: Secondary | ICD-10-CM | POA: Insufficient documentation

## 2021-08-24 DIAGNOSIS — J45909 Unspecified asthma, uncomplicated: Secondary | ICD-10-CM | POA: Insufficient documentation

## 2021-08-24 DIAGNOSIS — S0990XA Unspecified injury of head, initial encounter: Secondary | ICD-10-CM | POA: Diagnosis not present

## 2021-08-24 DIAGNOSIS — E039 Hypothyroidism, unspecified: Secondary | ICD-10-CM | POA: Diagnosis not present

## 2021-08-24 DIAGNOSIS — M4312 Spondylolisthesis, cervical region: Secondary | ICD-10-CM | POA: Diagnosis not present

## 2021-08-24 DIAGNOSIS — S0003XA Contusion of scalp, initial encounter: Secondary | ICD-10-CM | POA: Insufficient documentation

## 2021-08-24 DIAGNOSIS — I10 Essential (primary) hypertension: Secondary | ICD-10-CM | POA: Insufficient documentation

## 2021-08-24 DIAGNOSIS — Y92003 Bedroom of unspecified non-institutional (private) residence as the place of occurrence of the external cause: Secondary | ICD-10-CM | POA: Insufficient documentation

## 2021-08-24 DIAGNOSIS — G319 Degenerative disease of nervous system, unspecified: Secondary | ICD-10-CM | POA: Diagnosis not present

## 2021-08-24 DIAGNOSIS — Z043 Encounter for examination and observation following other accident: Secondary | ICD-10-CM | POA: Diagnosis not present

## 2021-08-24 DIAGNOSIS — G309 Alzheimer's disease, unspecified: Secondary | ICD-10-CM | POA: Insufficient documentation

## 2021-08-24 DIAGNOSIS — M47812 Spondylosis without myelopathy or radiculopathy, cervical region: Secondary | ICD-10-CM | POA: Diagnosis not present

## 2021-08-24 MED ORDER — HYDROCODONE-ACETAMINOPHEN 5-325 MG PO TABS
1.0000 | ORAL_TABLET | Freq: Once | ORAL | Status: AC
  Administered 2021-08-24: 1 via ORAL
  Filled 2021-08-24: qty 1

## 2021-08-24 NOTE — ED Triage Notes (Signed)
Pt arrives via [facility] from [place] after unwitnessed fall tonight. Pt arrives with hematoma to right side of head and c/o severe headache.

## 2021-08-24 NOTE — Discharge Instructions (Signed)
Your testing shows no signs of bleeding on the brain, no fractures of the skull or the neck, no fractures of the pelvis or the hips.  You may take Tylenol or ibuprofen as needed for headache, return to see your doctor within the week  Emergency department for worsening symptoms

## 2021-08-24 NOTE — ED Provider Notes (Signed)
[facility] EMERGENCY DEPARTMENT Provider Note   CSN: [id number] Arrival date & time: 08/24/21  2006     History Chief Complaint  Patient presents with   [name]    Ariana [name] is a 87 y.o. female.   Fall  This patient is an 87 year old female with a history of Alzheimer's disease currently at [facility], she had an unwitnessed fall in her bedroom, she was found with a hematoma to the right posterior occiput, she is not able to answer any questions due to her underlying dementia, level 5 caveat applies.  Review of the medical record shows that she has had a prior subarachnoid hemorrhage from trauma, she has also had a recent admission to the hospital in October 1 month ago because of a closed fracture of her hip.  SAH was a couple of months ago.    Past Medical History:  Diagnosis Date   Alzheimer disease ([place])    Anemia    Anxiety    Asthma    WELL CONTROLLED   Chronic back pain    Cognitive communication deficit    COPD (chronic obstructive pulmonary disease) (HCC)    Depression    Depression    GERD (gastroesophageal reflux disease)    Hiatal hernia    Hypercholesteremia    Hypothyroidism    Iron deficiency    ITP (idiopathic thrombocytopenic purpura)    FOLLOWED BR DR Grayland Ormond   LBBB (left bundle branch block)    Osteoarthritis    Osteoporosis    Restless legs     Patient Active Problem List   Diagnosis Date Noted   Protein-calorie malnutrition, severe 07/24/2021   Hypokalemia 07/22/2021   Hyperglycemia 07/22/2021   Prolonged QT interval 07/22/2021   Fracture of femoral neck, right ([place]) 07/21/2021   Cerebral hemorrhage ([place]) 07/14/2021   Subarachnoid hemorrhage ([place]) 07/13/2021   Hiatal hernia 04/09/2021   GERD (gastroesophageal reflux disease) 04/09/2021   Normocytic anemia    Skin tear of right hand without complication    Fall    Malnutrition of moderate degree 01/09/2021   Palliative care encounter    Closed displaced fracture  of left femoral neck ([place]) 10/30/2020   Rectal bleeding 08/18/2020   Leukocytosis 08/18/2020   Status post reverse total shoulder replacement, left 06/25/2020   Rotator cuff arthropathy, left 06/24/2020   Failure to thrive in adult 02/16/2020   Thrombocytopenia ([place]) 02/15/2020   Pneumonia 07/10/2019   Laceration of left lower leg 04/10/2019   Chronic pain disorder 01/11/2019   OSA (obstructive sleep apnea) 09/28/2018   Ulcer of lower limb, right, limited to breakdown of skin ([place]) 07/22/2018   Hematoma of lower limb, right, initial encounter 07/18/2018   Traumatic hematoma of right lower leg 07/12/2018   Asthma without status asthmaticus 11/25/2016   Late onset Alzheimer's disease without behavioral disturbance ([place]) 12/30/2015   Back muscle spasm 09/09/2015   Clinical depression 04/12/2015   Cellulitis 04/12/2015   Acute ITP ([place]) 03/15/2015   Chest pain 10/22/2014   Breathlessness on exertion 10/22/2014   Osteopenia 10/18/2014   Essential hypertension 06/28/2014   HLD (hyperlipidemia) 06/28/2014   Idiopathic thrombocythemia ([place]) 06/28/2014   Acquired hypothyroidism 06/04/2014   Depressive disorder 06/04/2014   Bursitis, trochanteric 03/27/2014   DDD (degenerative disc disease), lumbar 02/27/2014   Neuritis or radiculitis due to rupture of lumbar intervertebral disc 02/27/2014   Lumbar radiculitis 02/27/2014    Past Surgical History:  Procedure Laterality Date   CARPAL TUNNEL RELEASE  ESOPHAGOGASTRODUODENOSCOPY (EGD) WITH PROPOFOL N/A 09/10/2015   Procedure: ESOPHAGOGASTRODUODENOSCOPY (EGD) WITH PROPOFOL;  Surgeon: Lollie Sails, MD;  Location: [facility] ENDOSCOPY;  Service: Endoscopy;  Laterality: N/A;   HIP PINNING,CANNULATED Left 10/31/2020   Procedure: CANNULATED HIP PINNING;  Surgeon: Leim Fabry, MD;  Location: [facility] ORS;  Service: Orthopedics;  Laterality: Left;   HIP PINNING,CANNULATED Right 07/24/2021   Procedure: CANNULATED HIP PINNING;  Surgeon: Leandrew Koyanagi,  MD;  Location: [facility];  Service: Orthopedics;  Laterality: Right;   IRRIGATION AND DEBRIDEMENT HEMATOMA Right 07/13/2018   Procedure: IRRIGATION AND DEBRIDEMENT HEMATOMA-RIGHT SHIN;  Surgeon: Herbert Pun, MD;  Location: [facility] ORS;  Service: General;  Laterality: Right;   KYPHOPLASTY N/A 05/02/2019   Procedure: L1 KYPHOPLASTY;  Surgeon: Hessie Knows, MD;  Location: [facility] ORS;  Service: Orthopedics;  Laterality: N/A;   LUMBAR DISC SURGERY     REVERSE SHOULDER ARTHROPLASTY Left 06/25/2020   Procedure: REVERSE SHOULDER ARTHROPLASTY;  Surgeon: Corky Mull, MD;  Location: [facility] ORS;  Service: Orthopedics;  Laterality: Left;   SPLENECTOMY, PARTIAL     TOTAL ABDOMINAL HYSTERECTOMY       OB History   No obstetric history on file.     Family History  Problem Relation Age of Onset   Stroke Mother     Social History   Tobacco Use   Smoking status: Never   Smokeless tobacco: Never  Vaping Use   Vaping Use: Never used  Substance Use Topics   Alcohol use: No    Alcohol/week: 0.0 standard drinks   Drug use: No    Home Medications Prior to Admission medications   Medication Sig Start Date End Date Taking? Authorizing Provider  acetaminophen (TYLENOL) 325 MG tablet Take 2 tablets (650 mg total) by mouth every 6 (six) hours as needed for mild pain (or Fever >/= 101). 07/02/15   Idelle Crouch, MD  ALPRAZolam Duanne Moron) 0.5 MG tablet Take 1 tablet (0.5 mg total) by mouth 3 (three) times daily as needed for anxiety. 07/30/21   Aline August, MD  amLODipine (NORVASC) 10 MG tablet Take 1 tablet (10 mg total) by mouth daily. 01/17/21   Ezekiel Slocumb, DO  busPIRone (BUSPAR) 15 MG tablet Take 7.5 mg by mouth 2 (two) times daily. 07/02/21   [provider]  Cholecalciferol (VITAMIN D) 50 MCG (2000 UT) tablet Take 2,000 Units by mouth daily.    [provider]  citalopram (CELEXA) 20 MG tablet Take 1 tablet (20 mg total) by mouth every morning. 02/06/21   Johnson, Clanford L,  MD  Cyanocobalamin 1000 MCG TBCR Take 1,000 mcg by mouth daily.     [provider]  diclofenac Sodium (VOLTAREN) 1 % GEL Apply 2 g topically in the morning and at bedtime. Both knees    [provider]  Fluticasone-Salmeterol (ADVAIR) 250-50 MCG/DOSE AEPB Inhale 1 puff into the lungs 2 (two) times daily as needed (shortness of breath).    [provider]  HYDROcodone-acetaminophen (NORCO) 7.5-325 MG tablet Take 1-2 tablets by mouth 3 (three) times daily as needed for moderate pain. 07/24/21   Leandrew Koyanagi, MD  iron polysaccharides (NIFEREX) 150 MG capsule Take 1 capsule (150 mg total) by mouth daily. 01/17/21   Ezekiel Slocumb, DO  lamoTRIgine (LAMICTAL) 25 MG tablet Take 50 mg by mouth daily. 07/16/21   [provider]  levothyroxine (SYNTHROID, LEVOTHROID) 50 MCG tablet Take 50 mcg by mouth daily before breakfast. Take 30 to 60 minutes before breakfast.  [provider]  loratadine (CLARITIN) 10 MG tablet Take 1 tablet (10 mg total) by mouth daily. 01/17/21   Ezekiel Slocumb, DO  memantine (NAMENDA) 10 MG tablet Take 10 mg by mouth 2 (two) times daily.    [provider]  methocarbamol (ROBAXIN) 500 MG tablet Take 1 tablet (500 mg total) by mouth every 6 (six) hours as needed for muscle spasms. 07/30/21   Aline August, MD  Multiple Vitamin (MULTI-VITAMIN) tablet Take 1 tablet by mouth daily.    [provider]  oxybutynin (DITROPAN-XL) 5 MG 24 hr tablet Take 5 mg by mouth at bedtime.    [provider]  pantoprazole (PROTONIX) 40 MG tablet Take 40 mg by mouth daily. 07/24/15   [provider]  polyethylene glycol (MIRALAX / GLYCOLAX) 17 g packet Take 17 g by mouth daily as needed for moderate constipation. 07/30/21   Aline August, MD  rosuvastatin (CRESTOR) 5 MG tablet Take 5 mg by mouth every evening.    [provider]  senna-docusate (SENOKOT-S) 8.6-50 MG tablet Take 1 tablet by mouth 2 (two) times  daily. 07/30/21   Aline August, MD    Allergies    Aspirin, Diazepam, Tetanus toxoids, and Morphine  Review of Systems   Review of Systems  Unable to perform ROS: Dementia   Physical Exam Updated Vital Signs BP 118/74   Pulse 80   Temp 98.2 F (36.8 C) (Oral)   Resp 18   Ht 1.702 m (5\' 7" )   Wt 56.7 kg   SpO2 98%   BMI 19.58 kg/m   Physical Exam Vitals and nursing note reviewed.  Constitutional:      General: She is not in acute distress.    Appearance: She is well-developed.  HENT:     Head: Normocephalic.     Comments: Hematoma to the right posterior occiput    Nose: Nose normal. No congestion or rhinorrhea.     Mouth/Throat:     Pharynx: No oropharyngeal exudate.  Eyes:     General: No scleral icterus.       Right eye: No discharge.        Left eye: No discharge.     Conjunctiva/sclera: Conjunctivae normal.     Pupils: Pupils are equal, round, and reactive to light.  Neck:     Thyroid: No thyromegaly.     Vascular: No JVD.  Cardiovascular:     Rate and Rhythm: Normal rate and regular rhythm.     Heart sounds: Normal heart sounds. No murmur heard.   No friction rub. No gallop.  Pulmonary:     Effort: Pulmonary effort is normal. No respiratory distress.     Breath sounds: Normal breath sounds. No wheezing or rales.  Abdominal:     General: Bowel sounds are normal. There is no distension.     Palpations: Abdomen is soft. There is no mass.     Tenderness: There is no abdominal tenderness.  Musculoskeletal:        General: No tenderness. Normal range of motion.     Cervical back: Normal range of motion and neck supple.     Comments: Able to move all 4 extremities through range of motion, no deformities, no pain obviously with range of motion of the hip  Lymphadenopathy:     Cervical: No cervical adenopathy.  Skin:    General: Skin is warm and dry.     Findings: No erythema or rash.  Neurological:  Mental Status: She is alert.     Coordination:  Coordination normal.     Comments: The patient is able to follow some simple commands like raising legs and squeezing my hands but does not answer questions with words, occasionally she will say something but not really answering question  Psychiatric:        Behavior: Behavior normal.    ED Results / Procedures / Treatments   Labs (all labs ordered are listed, but only abnormal results are displayed) Labs Reviewed - No data to display  EKG None  Radiology DG Pelvis 1-2 Views  Result Date: 08/24/2021 CLINICAL DATA:  Unwitnessed fall. EXAM: PELVIS - 1-2 VIEW COMPARISON:  Pelvis and right hip radiograph 08/13/2021 FINDINGS: Bilateral hip pinning traversing prior femoral neck fractures, unchanged in appearance from prior exam. Subchondral collapse of the left femoral head is unchanged. No new pelvic fracture. Pubic rami are intact, allowing for mild patient rotation. There is no symphyseal or sacroiliac diastasis. Bones are diffusely under mineralized. Surgical hardware in the lower lumbar spine is unchanged. IMPRESSION: 1. No acute pelvic fracture. 2. Bilateral hip pinning with stable appearance of prior femoral neck fractures. Electronically Signed   By: Keith Rake M.D.   On: 08/24/2021 20:50   CT Head Wo Contrast  Result Date: 08/24/2021 CLINICAL DATA:  Unwitnessed fall EXAM: CT HEAD WITHOUT CONTRAST TECHNIQUE: Contiguous axial images were obtained from the base of the skull through the vertex without intravenous contrast. COMPARISON:  07/21/2021 FINDINGS: Brain: There is atrophy and chronic small vessel disease changes. No acute intracranial abnormality. Specifically, no hemorrhage, hydrocephalus, mass lesion, acute infarction, or significant intracranial injury. Vascular: No hyperdense vessel or unexpected calcification. Skull: No acute calvarial abnormality. Sinuses/Orbits: No acute findings Other: None IMPRESSION: Atrophy, chronic microvascular disease. No acute intracranial  abnormality. Electronically Signed   By: Rolm Baptise M.D.   On: 08/24/2021 21:06    Procedures Procedures   Medications Ordered in ED Medications  HYDROcodone-acetaminophen (NORCO/VICODIN) 5-325 MG per tablet 1 tablet (1 tablet Oral Given 08/24/21 2242)    ED Course  I have reviewed the triage vital signs and the nursing notes.  Pertinent labs & imaging results that were available during my care of the patient were reviewed by me and considered in my medical decision making (see chart for details).    MDM Rules/Calculators/A&P                           Obvious head injury, seems to have poor balance with multiple falls, will check CT scan of the brain and cervical spine, also check a pelvic x-ray to make sure there is no fractures of her femurs or dislocations.  CT scans of the head and cervical spine are unremarkable as is the pelvis, this patient is stable for discharge  Final Clinical Impression(s) / ED Diagnoses Final diagnoses:  Contusion of scalp, initial encounter  Fall, initial encounter  Minor head injury, initial encounter    Rx / DC Orders ED Discharge Orders     None        Noemi Chapel, MD 08/24/21 2300

## 2021-08-25 DIAGNOSIS — R1312 Dysphagia, oropharyngeal phase: Secondary | ICD-10-CM | POA: Diagnosis not present

## 2021-08-25 DIAGNOSIS — R41841 Cognitive communication deficit: Secondary | ICD-10-CM | POA: Diagnosis not present

## 2021-08-25 DIAGNOSIS — F32A Depression, unspecified: Secondary | ICD-10-CM | POA: Diagnosis not present

## 2021-08-25 DIAGNOSIS — N390 Urinary tract infection, site not specified: Secondary | ICD-10-CM | POA: Diagnosis not present

## 2021-08-25 DIAGNOSIS — R52 Pain, unspecified: Secondary | ICD-10-CM | POA: Diagnosis not present

## 2021-08-25 DIAGNOSIS — E039 Hypothyroidism, unspecified: Secondary | ICD-10-CM | POA: Diagnosis not present

## 2021-08-25 DIAGNOSIS — R5381 Other malaise: Secondary | ICD-10-CM | POA: Diagnosis not present

## 2021-08-25 DIAGNOSIS — M6281 Muscle weakness (generalized): Secondary | ICD-10-CM | POA: Diagnosis not present

## 2021-08-25 DIAGNOSIS — R319 Hematuria, unspecified: Secondary | ICD-10-CM | POA: Diagnosis not present

## 2021-08-25 DIAGNOSIS — S066X0D Traumatic subarachnoid hemorrhage without loss of consciousness, subsequent encounter: Secondary | ICD-10-CM | POA: Diagnosis not present

## 2021-08-25 DIAGNOSIS — K219 Gastro-esophageal reflux disease without esophagitis: Secondary | ICD-10-CM | POA: Diagnosis not present

## 2021-08-25 DIAGNOSIS — S72011D Unspecified intracapsular fracture of right femur, subsequent encounter for closed fracture with routine healing: Secondary | ICD-10-CM | POA: Diagnosis not present

## 2021-08-25 DIAGNOSIS — I10 Essential (primary) hypertension: Secondary | ICD-10-CM | POA: Diagnosis not present

## 2021-08-25 DIAGNOSIS — E119 Type 2 diabetes mellitus without complications: Secondary | ICD-10-CM | POA: Diagnosis not present

## 2021-08-26 DIAGNOSIS — M6281 Muscle weakness (generalized): Secondary | ICD-10-CM | POA: Diagnosis not present

## 2021-08-26 DIAGNOSIS — R41841 Cognitive communication deficit: Secondary | ICD-10-CM | POA: Diagnosis not present

## 2021-08-26 DIAGNOSIS — R1312 Dysphagia, oropharyngeal phase: Secondary | ICD-10-CM | POA: Diagnosis not present

## 2021-08-26 DIAGNOSIS — S72011D Unspecified intracapsular fracture of right femur, subsequent encounter for closed fracture with routine healing: Secondary | ICD-10-CM | POA: Diagnosis not present

## 2021-08-26 DIAGNOSIS — S066X0D Traumatic subarachnoid hemorrhage without loss of consciousness, subsequent encounter: Secondary | ICD-10-CM | POA: Diagnosis not present

## 2021-08-27 DIAGNOSIS — R41841 Cognitive communication deficit: Secondary | ICD-10-CM | POA: Diagnosis not present

## 2021-08-27 DIAGNOSIS — F411 Generalized anxiety disorder: Secondary | ICD-10-CM | POA: Diagnosis not present

## 2021-08-27 DIAGNOSIS — S72011D Unspecified intracapsular fracture of right femur, subsequent encounter for closed fracture with routine healing: Secondary | ICD-10-CM | POA: Diagnosis not present

## 2021-08-27 DIAGNOSIS — S066X0D Traumatic subarachnoid hemorrhage without loss of consciousness, subsequent encounter: Secondary | ICD-10-CM | POA: Diagnosis not present

## 2021-08-27 DIAGNOSIS — M6281 Muscle weakness (generalized): Secondary | ICD-10-CM | POA: Diagnosis not present

## 2021-08-27 DIAGNOSIS — R1312 Dysphagia, oropharyngeal phase: Secondary | ICD-10-CM | POA: Diagnosis not present

## 2021-08-28 DIAGNOSIS — R5381 Other malaise: Secondary | ICD-10-CM | POA: Diagnosis not present

## 2021-08-28 DIAGNOSIS — F0390 Unspecified dementia without behavioral disturbance: Secondary | ICD-10-CM | POA: Diagnosis not present

## 2021-08-28 DIAGNOSIS — S066X0D Traumatic subarachnoid hemorrhage without loss of consciousness, subsequent encounter: Secondary | ICD-10-CM | POA: Diagnosis not present

## 2021-08-28 DIAGNOSIS — F419 Anxiety disorder, unspecified: Secondary | ICD-10-CM | POA: Diagnosis not present

## 2021-08-28 DIAGNOSIS — Z79899 Other long term (current) drug therapy: Secondary | ICD-10-CM | POA: Diagnosis not present

## 2021-08-28 DIAGNOSIS — I10 Essential (primary) hypertension: Secondary | ICD-10-CM | POA: Diagnosis not present

## 2021-08-28 DIAGNOSIS — M6281 Muscle weakness (generalized): Secondary | ICD-10-CM | POA: Diagnosis not present

## 2021-08-28 DIAGNOSIS — G629 Polyneuropathy, unspecified: Secondary | ICD-10-CM | POA: Diagnosis not present

## 2021-08-28 DIAGNOSIS — S72011D Unspecified intracapsular fracture of right femur, subsequent encounter for closed fracture with routine healing: Secondary | ICD-10-CM | POA: Diagnosis not present

## 2021-08-28 DIAGNOSIS — R1312 Dysphagia, oropharyngeal phase: Secondary | ICD-10-CM | POA: Diagnosis not present

## 2021-08-28 DIAGNOSIS — K219 Gastro-esophageal reflux disease without esophagitis: Secondary | ICD-10-CM | POA: Diagnosis not present

## 2021-08-28 DIAGNOSIS — F32A Depression, unspecified: Secondary | ICD-10-CM | POA: Diagnosis not present

## 2021-08-28 DIAGNOSIS — R41841 Cognitive communication deficit: Secondary | ICD-10-CM | POA: Diagnosis not present

## 2021-09-01 DIAGNOSIS — I10 Essential (primary) hypertension: Secondary | ICD-10-CM | POA: Diagnosis not present

## 2021-09-01 DIAGNOSIS — F419 Anxiety disorder, unspecified: Secondary | ICD-10-CM | POA: Diagnosis not present

## 2021-09-01 DIAGNOSIS — E039 Hypothyroidism, unspecified: Secondary | ICD-10-CM | POA: Diagnosis not present

## 2021-09-01 DIAGNOSIS — R5381 Other malaise: Secondary | ICD-10-CM | POA: Diagnosis not present

## 2021-09-01 DIAGNOSIS — F32A Depression, unspecified: Secondary | ICD-10-CM | POA: Diagnosis not present

## 2021-09-01 DIAGNOSIS — G629 Polyneuropathy, unspecified: Secondary | ICD-10-CM | POA: Diagnosis not present

## 2021-09-01 DIAGNOSIS — R52 Pain, unspecified: Secondary | ICD-10-CM | POA: Diagnosis not present

## 2021-09-01 DIAGNOSIS — K219 Gastro-esophageal reflux disease without esophagitis: Secondary | ICD-10-CM | POA: Diagnosis not present

## 2021-09-04 DIAGNOSIS — F411 Generalized anxiety disorder: Secondary | ICD-10-CM | POA: Diagnosis not present

## 2021-09-08 NOTE — Progress Notes (Deleted)
[facility]  Telephone:([phone]  Fax:([phone]) [phone]     Ariana [name] DOB: February 07, 1933  MR#: [id number]  YFV#:[id number]  Patient Care Team: Caprice Renshaw, MD as PCP - General (Internal Medicine) Lloyd Huger, MD as Consulting Physician (Oncology)   CHIEF COMPLAINT: Chronic refractory ITP  INTERVAL HISTORY: Patient returns to clinic today for repeat laboratory work, further evaluation, and continuation of Nplate.  She continues to have baseline dementia and a poor memory, but otherwise appears at her baseline. She has chronic weakness and fatigue, but denies any further falling. She denies any easy bleeding or bruising.  She has no neurologic complaints.  She denies any recent fevers or illnesses. She denies any chest pain, shortness of breath, cough, or hemoptysis.  She denies any nausea, vomiting, constipation, or diarrhea.  She has no melena or hematochezia.  She has no urinary complaints.  Patient offers no further specific complaints today.  REVIEW OF SYSTEMS:   Review of Systems  Constitutional:  Positive for malaise/fatigue. Negative for fever and weight loss.  HENT:  Negative for congestion.   Respiratory: Negative.  Negative for cough, hemoptysis and shortness of breath.   Cardiovascular: Negative.  Negative for chest pain and leg swelling.  Gastrointestinal: Negative.  Negative for abdominal pain, blood in stool, diarrhea, melena and nausea.  Genitourinary: Negative.  Negative for dysuria and hematuria.  Musculoskeletal: Negative.  Negative for back pain, falls and joint pain.  Skin: Negative.  Negative for rash.  Neurological:  Positive for weakness. Negative for dizziness, sensory change, focal weakness and headaches.  Endo/Heme/Allergies:  Does not bruise/bleed easily.  Psychiatric/Behavioral:  Positive for memory loss. Negative for depression. The patient is not nervous/anxious.    As per HPI. Otherwise, a complete review of systems is  negative.  PAST MEDICAL HISTORY: Past Medical History:  Diagnosis Date   Alzheimer disease ([place])    Anemia    Anxiety    Asthma    WELL CONTROLLED   Chronic back pain    Cognitive communication deficit    COPD (chronic obstructive pulmonary disease) (HCC)    Depression    Depression    GERD (gastroesophageal reflux disease)    Hiatal hernia    Hypercholesteremia    Hypothyroidism    Iron deficiency    ITP (idiopathic thrombocytopenic purpura)    FOLLOWED BR DR Grayland Ormond   LBBB (left bundle branch block)    Osteoarthritis    Osteoporosis    Restless legs     PAST SURGICAL HISTORY: Past Surgical History:  Procedure Laterality Date   CARPAL TUNNEL RELEASE     ESOPHAGOGASTRODUODENOSCOPY (EGD) WITH PROPOFOL N/A 09/10/2015   Procedure: ESOPHAGOGASTRODUODENOSCOPY (EGD) WITH PROPOFOL;  Surgeon: Lollie Sails, MD;  Location: [facility] ENDOSCOPY;  Service: Endoscopy;  Laterality: N/A;   HIP PINNING,CANNULATED Left 10/31/2020   Procedure: CANNULATED HIP PINNING;  Surgeon: Leim Fabry, MD;  Location: [facility] ORS;  Service: Orthopedics;  Laterality: Left;   HIP PINNING,CANNULATED Right 07/24/2021   Procedure: CANNULATED HIP PINNING;  Surgeon: Leandrew Koyanagi, MD;  Location: [facility];  Service: Orthopedics;  Laterality: Right;   IRRIGATION AND DEBRIDEMENT HEMATOMA Right 07/13/2018   Procedure: IRRIGATION AND DEBRIDEMENT HEMATOMA-RIGHT SHIN;  Surgeon: Herbert Pun, MD;  Location: [facility] ORS;  Service: General;  Laterality: Right;   KYPHOPLASTY N/A 05/02/2019   Procedure: L1 KYPHOPLASTY;  Surgeon: Hessie Knows, MD;  Location: [facility] ORS;  Service: Orthopedics;  Laterality: N/A;   LUMBAR DISC SURGERY     REVERSE SHOULDER  ARTHROPLASTY Left 06/25/2020   Procedure: REVERSE SHOULDER ARTHROPLASTY;  Surgeon: Corky Mull, MD;  Location: [facility] ORS;  Service: Orthopedics;  Laterality: Left;   SPLENECTOMY, PARTIAL     TOTAL ABDOMINAL HYSTERECTOMY      FAMILY HISTORY Family History  Problem Relation  Age of Onset   Stroke Mother     GYNECOLOGIC HISTORY:  No LMP recorded. Patient has had a hysterectomy.     ADVANCED DIRECTIVES:    HEALTH MAINTENANCE: Social History   Tobacco Use   Smoking status: Never   Smokeless tobacco: Never  Vaping Use   Vaping Use: Never used  Substance Use Topics   Alcohol use: No    Alcohol/week: 0.0 standard drinks   Drug use: No     Allergies  Allergen Reactions   Aspirin Other (See Comments)    Upset stomach   Diazepam Itching    sneezing   Tetanus Toxoids Swelling    Localized superficial swelling of skin   Morphine Itching and Rash    Current Outpatient Medications  Medication Sig Dispense Refill   acetaminophen (TYLENOL) 325 MG tablet Take 2 tablets (650 mg total) by mouth every 6 (six) hours as needed for mild pain (or Fever >/= 101). 100 tablet 0   ALPRAZolam (XANAX) 0.5 MG tablet Take 1 tablet (0.5 mg total) by mouth 3 (three) times daily as needed for anxiety. 10 tablet 0   amLODipine (NORVASC) 10 MG tablet Take 1 tablet (10 mg total) by mouth daily.     busPIRone (BUSPAR) 15 MG tablet Take 7.5 mg by mouth 2 (two) times daily.     Cholecalciferol (VITAMIN D) 50 MCG (2000 UT) tablet Take 2,000 Units by mouth daily.     citalopram (CELEXA) 20 MG tablet Take 1 tablet (20 mg total) by mouth every morning.     Cyanocobalamin 1000 MCG TBCR Take 1,000 mcg by mouth daily.      diclofenac Sodium (VOLTAREN) 1 % GEL Apply 2 g topically in the morning and at bedtime. Both knees     Fluticasone-Salmeterol (ADVAIR) 250-50 MCG/DOSE AEPB Inhale 1 puff into the lungs 2 (two) times daily as needed (shortness of breath).     HYDROcodone-acetaminophen (NORCO) 7.5-325 MG tablet Take 1-2 tablets by mouth 3 (three) times daily as needed for moderate pain. 30 tablet 0   iron polysaccharides (NIFEREX) 150 MG capsule Take 1 capsule (150 mg total) by mouth daily.     lamoTRIgine (LAMICTAL) 25 MG tablet Take 50 mg by mouth daily.     levothyroxine  (SYNTHROID, LEVOTHROID) 50 MCG tablet Take 50 mcg by mouth daily before breakfast. Take 30 to 60 minutes before breakfast.     loratadine (CLARITIN) 10 MG tablet Take 1 tablet (10 mg total) by mouth daily.     memantine (NAMENDA) 10 MG tablet Take 10 mg by mouth 2 (two) times daily.     methocarbamol (ROBAXIN) 500 MG tablet Take 1 tablet (500 mg total) by mouth every 6 (six) hours as needed for muscle spasms. 30 tablet 0   Multiple Vitamin (MULTI-VITAMIN) tablet Take 1 tablet by mouth daily.     oxybutynin (DITROPAN-XL) 5 MG 24 hr tablet Take 5 mg by mouth at bedtime.     pantoprazole (PROTONIX) 40 MG tablet Take 40 mg by mouth daily.     polyethylene glycol (MIRALAX / GLYCOLAX) 17 g packet Take 17 g by mouth daily as needed for moderate constipation. 14 each 0   rosuvastatin (CRESTOR) 5 MG  tablet Take 5 mg by mouth every evening.     senna-docusate (SENOKOT-S) 8.6-50 MG tablet Take 1 tablet by mouth 2 (two) times daily. 20 tablet 0   No current facility-administered medications for this visit.    OBJECTIVE: There were no vitals taken for this visit.   There is no height or weight on file to calculate BMI.    ECOG FS:2 - Symptomatic, <50% confined to bed  General: Well-developed, well-nourished, no acute distress. Eyes: Pink conjunctiva, anicteric sclera. HEENT: Normocephalic, moist mucous membranes. Lungs: No audible wheezing or coughing. Heart: Regular rate and rhythm. Abdomen: Soft, nontender, no obvious distention. Musculoskeletal: No edema, cyanosis, or clubbing. Neuro: Alert, answering all questions appropriately. Cranial nerves grossly intact. Skin: No rashes or petechiae noted. Psych: Normal affect.  LAB RESULTS:  No visits with results within 3 Day(s) from this visit.  Latest known visit with results is:  Appointment on 08/13/2021  Component Date Value Ref Range Status   WBC 08/13/2021 10.8 (H)  4.0 - 10.5 K/uL Final   RBC 08/13/2021 4.09  3.87 - 5.11 MIL/uL Final    Hemoglobin 08/13/2021 12.6  12.0 - 15.0 g/dL Final   HCT 08/13/2021 38.7  36.0 - 46.0 % Final   MCV 08/13/2021 94.6  80.0 - 100.0 fL Final   MCH 08/13/2021 30.8  26.0 - 34.0 pg Final   MCHC 08/13/2021 32.6  30.0 - 36.0 g/dL Final   RDW 08/13/2021 17.2 (H)  11.5 - 15.5 % Final   Platelets 08/13/2021 15 (LL)  150 - 400 K/uL Final   This critical result has verified and been called to DR.Rodriques Badie by Finis Bud on 11 02 2022 at 1356, and has been read back.    nRBC 08/13/2021 0.0  0.0 - 0.2 % Final   Neutrophils Relative % 08/13/2021 55  % Final   Neutro Abs 08/13/2021 5.9  1.7 - 7.7 K/uL Final   Lymphocytes Relative 08/13/2021 34  % Final   Lymphs Abs 08/13/2021 3.7  0.7 - 4.0 K/uL Final   Monocytes Relative 08/13/2021 7  % Final   Monocytes Absolute 08/13/2021 0.8  0.1 - 1.0 K/uL Final   Eosinophils Relative 08/13/2021 3  % Final   Eosinophils Absolute 08/13/2021 0.3  0.0 - 0.5 K/uL Final   Basophils Relative 08/13/2021 1  % Final   Basophils Absolute 08/13/2021 0.1  0.0 - 0.1 K/uL Final   WBC Morphology 08/13/2021 MORPHOLOGY UNREMARKABLE   Final   RBC Morphology 08/13/2021 MORPHOLOGY UNREMARKABLE   Final   Smear Review 08/13/2021 Normal platelet morphology   Final   Comment: PLATELETS APPEAR DECREASED PLATELET COUNT CONFIRMED BY SMEAR    Immature Granulocytes 08/13/2021 0  % Final   Abs Immature Granulocytes 08/13/2021 0.04  0.00 - 0.07 K/uL Final   Performed at [facility], [address] [place], [place] [address]    STUDIES: No results found.  ASSESSMENT:  Chronic refractory ITP  PLAN:   1. Chronic refractory ITP: Patient had a poor response to Prednisone, WinRho, and Rituxan. She is status post splenectomy in 2007.  She received IVIG and Nplate while in the hospital with significant improvement of her platelet count.   If Nplate no longer was effective, could retry IVIG at a future date.  Patient is currently receiving the max dose of Nplate at 10 mcg/kg.  Can also  consider Promacta 12.5mg  or Tavalisse 100 mg BID if needed.  Patient's platelet count is 13 today.  Proceed with Nplate as  ordered.  Return to clinic in 4 weeks for further evaluation and continuation of treatment.   2. Weakness and fatigue: Chronic and unchanged. 3.  Dementia: Chronic and unchanged.  Patient is now on a full-time memory unit.  Her husband is no longer involved in her care. 4.  Fractured hip: Resolved.  Patient expressed understanding and was in agreement with this plan. She also understands that She can call clinic at any time with any questions, concerns, or complaints.    Lloyd Huger, MD   09/08/2021 10:16 AM

## 2021-09-10 ENCOUNTER — Ambulatory Visit: Payer: Medicare HMO

## 2021-09-10 ENCOUNTER — Other Ambulatory Visit: Payer: Medicare HMO

## 2021-09-10 ENCOUNTER — Ambulatory Visit (INDEPENDENT_AMBULATORY_CARE_PROVIDER_SITE_OTHER): Payer: Medicare HMO | Admitting: Orthopaedic Surgery

## 2021-09-10 ENCOUNTER — Encounter: Payer: Self-pay | Admitting: Orthopaedic Surgery

## 2021-09-10 ENCOUNTER — Ambulatory Visit (INDEPENDENT_AMBULATORY_CARE_PROVIDER_SITE_OTHER): Payer: Medicare HMO

## 2021-09-10 ENCOUNTER — Ambulatory Visit: Payer: Medicare HMO | Admitting: Oncology

## 2021-09-10 DIAGNOSIS — S72001A Fracture of unspecified part of neck of right femur, initial encounter for closed fracture: Secondary | ICD-10-CM

## 2021-09-10 DIAGNOSIS — F411 Generalized anxiety disorder: Secondary | ICD-10-CM | POA: Diagnosis not present

## 2021-09-10 DIAGNOSIS — M25551 Pain in right hip: Secondary | ICD-10-CM

## 2021-09-10 NOTE — Progress Notes (Signed)
Post-Op Visit Note   Patient: Ariana [name]           Date of Birth: [birth year]           MRN: [id number] Visit Date: 09/10/2021 PCP: Caprice Renshaw, MD   Assessment & Plan:  Chief Complaint:  Chief Complaint  Patient presents with   Right Hip - Follow-up    Right hip pinning 07/24/2021   Visit Diagnoses:  1. Pain in right hip   2. Closed fracture of neck of right femur, initial encounter ([place])     Plan: Felma is 6 weeks s/p right hip pinning.  Denies any pain.  Not really walking that much.  Has advanced dementia.  Xrays show stable fixation without collapse of femoral head or fracture.  Continue PT as tolerated.  WBAT.  Follow up in 6 weeks for repeat right hip xrays.    Follow-Up Instructions: Return in about 6 weeks (around 10/22/2021).   Orders:  Orders Placed This Encounter  Procedures   XR HIP UNILAT W OR W/O PELVIS 2-3 VIEWS RIGHT   No orders of the defined types were placed in this encounter.   Imaging: XR HIP UNILAT W OR W/O PELVIS 2-3 VIEWS RIGHT  Result Date: 09/10/2021 Stable fixation of subcapital femoral neck fracture without hardware complications   PMFS History: Patient Active Problem List   Diagnosis Date Noted   Protein-calorie malnutrition, severe 07/24/2021   Hypokalemia 07/22/2021   Hyperglycemia 07/22/2021   Prolonged QT interval 07/22/2021   Fracture of femoral neck, right ([place]) 07/21/2021   Cerebral hemorrhage ([place]) 07/14/2021   Subarachnoid hemorrhage ([place]) 07/13/2021   Hiatal hernia 04/09/2021   GERD (gastroesophageal reflux disease) 04/09/2021   Normocytic anemia    Skin tear of right hand without complication    Fall    Malnutrition of moderate degree 01/09/2021   Palliative care encounter    Closed displaced fracture of left femoral neck ([place]) 10/30/2020   Rectal bleeding 08/18/2020   Leukocytosis 08/18/2020   Status post reverse total shoulder replacement, left 06/25/2020   Rotator cuff arthropathy, left 06/24/2020    Failure to thrive in adult 02/16/2020   Thrombocytopenia ([place]) 02/15/2020   Pneumonia 07/10/2019   Laceration of left lower leg 04/10/2019   Chronic pain disorder 01/11/2019   OSA (obstructive sleep apnea) 09/28/2018   Ulcer of lower limb, right, limited to breakdown of skin ([place]) 07/22/2018   Hematoma of lower limb, right, initial encounter 07/18/2018   Traumatic hematoma of right lower leg 07/12/2018   Asthma without status asthmaticus 11/25/2016   Late onset Alzheimer's disease without behavioral disturbance ([place]) 12/30/2015   Back muscle spasm 09/09/2015   Clinical depression 04/12/2015   Cellulitis 04/12/2015   Acute ITP ([place]) 03/15/2015   Chest pain 10/22/2014   Breathlessness on exertion 10/22/2014   Osteopenia 10/18/2014   Essential hypertension 06/28/2014   HLD (hyperlipidemia) 06/28/2014   Idiopathic thrombocythemia ([place]) 06/28/2014   Acquired hypothyroidism 06/04/2014   Depressive disorder 06/04/2014   Bursitis, trochanteric 03/27/2014   DDD (degenerative disc disease), lumbar 02/27/2014   Neuritis or radiculitis due to rupture of lumbar intervertebral disc 02/27/2014   Lumbar radiculitis 02/27/2014   Past Medical History:  Diagnosis Date   Alzheimer disease ([place])    Anemia    Anxiety    Asthma    WELL CONTROLLED   Chronic back pain    Cognitive communication deficit    COPD (chronic obstructive pulmonary disease) ([place])    Depression    Depression  GERD (gastroesophageal reflux disease)    Hiatal hernia    Hypercholesteremia    Hypothyroidism    Iron deficiency    ITP (idiopathic thrombocytopenic purpura)    FOLLOWED BR DR Grayland Ormond   LBBB (left bundle branch block)    Osteoarthritis    Osteoporosis    Restless legs     Family History  Problem Relation Age of Onset   Stroke Mother     Past Surgical History:  Procedure Laterality Date   CARPAL TUNNEL RELEASE     ESOPHAGOGASTRODUODENOSCOPY (EGD) WITH PROPOFOL N/A 09/10/2015   Procedure:  ESOPHAGOGASTRODUODENOSCOPY (EGD) WITH PROPOFOL;  Surgeon: Lollie Sails, MD;  Location: [facility] ENDOSCOPY;  Service: Endoscopy;  Laterality: N/A;   HIP PINNING,CANNULATED Left 10/31/2020   Procedure: CANNULATED HIP PINNING;  Surgeon: Leim Fabry, MD;  Location: [facility] ORS;  Service: Orthopedics;  Laterality: Left;   HIP PINNING,CANNULATED Right 07/24/2021   Procedure: CANNULATED HIP PINNING;  Surgeon: Leandrew Koyanagi, MD;  Location: [place];  Service: Orthopedics;  Laterality: Right;   IRRIGATION AND DEBRIDEMENT HEMATOMA Right 07/13/2018   Procedure: IRRIGATION AND DEBRIDEMENT HEMATOMA-RIGHT SHIN;  Surgeon: Herbert Pun, MD;  Location: [facility] ORS;  Service: General;  Laterality: Right;   KYPHOPLASTY N/A 05/02/2019   Procedure: L1 KYPHOPLASTY;  Surgeon: Hessie Knows, MD;  Location: [facility] ORS;  Service: Orthopedics;  Laterality: N/A;   LUMBAR DISC SURGERY     REVERSE SHOULDER ARTHROPLASTY Left 06/25/2020   Procedure: REVERSE SHOULDER ARTHROPLASTY;  Surgeon: Corky Mull, MD;  Location: [facility] ORS;  Service: Orthopedics;  Laterality: Left;   SPLENECTOMY, PARTIAL     TOTAL ABDOMINAL HYSTERECTOMY     Social History   Occupational History   Not on file  Tobacco Use   Smoking status: Never   Smokeless tobacco: Never  Vaping Use   Vaping Use: Never used  Substance and Sexual Activity   Alcohol use: No    Alcohol/week: 0.0 standard drinks   Drug use: No   Sexual activity: Not on file

## 2021-09-11 ENCOUNTER — Inpatient Hospital Stay: Payer: Medicare HMO

## 2021-09-11 ENCOUNTER — Inpatient Hospital Stay: Payer: Medicare HMO | Admitting: Oncology

## 2021-09-12 NOTE — Progress Notes (Signed)
[facility] [place]  [phone]  [phone]     Ariana [name] DOB: [birth year]  MR#: [id number]  FBP#:[id number]  Patient Care Team: Caprice Renshaw, MD as PCP - General (Internal Medicine) Lloyd Huger, MD as Consulting Physician (Oncology)   CHIEF COMPLAINT: Chronic refractory ITP  INTERVAL HISTORY: Patient returns to clinic today for repeat laboratory work, further evaluation, and continuation of Nplate.  Patient missed her appointment last week.  She has increased cough, nausea, and confusion today.  Patient is somewhat noncommunicative today and much of the history is given by her caretaker.  She has chronic weakness and fatigue, but denies any further falling. She denies any easy bleeding or bruising.  She has no neurologic complaints.  There is no reported fever.  She denies any chest pain, shortness of breath, or hemoptysis.  She denies any nausea, vomiting, constipation, or diarrhea.  She has no melena or hematochezia.  She has no urinary complaints.  Patient offers no further specific complaints today.  REVIEW OF SYSTEMS:   Review of Systems  Constitutional:  Positive for malaise/fatigue. Negative for fever and weight loss.  HENT:  Negative for congestion.   Respiratory:  Positive for cough. Negative for hemoptysis and shortness of breath.   Cardiovascular: Negative.  Negative for chest pain and leg swelling.  Gastrointestinal:  Positive for nausea. Negative for abdominal pain, blood in stool, diarrhea and melena.  Genitourinary: Negative.  Negative for dysuria and hematuria.  Musculoskeletal: Negative.  Negative for back pain, falls and joint pain.  Skin: Negative.  Negative for rash.  Neurological:  Positive for weakness. Negative for dizziness, sensory change, focal weakness and headaches.  Endo/Heme/Allergies:  Does not bruise/bleed easily.  Psychiatric/Behavioral:  Positive for memory loss. Negative for depression. The patient is not  nervous/anxious.    As per HPI. Otherwise, a complete review of systems is negative.  PAST MEDICAL HISTORY: Past Medical History:  Diagnosis Date   Alzheimer disease ([place])    Anemia    Anxiety    Asthma    WELL CONTROLLED   Chronic back pain    Cognitive communication deficit    COPD (chronic obstructive pulmonary disease) ([facility])    Depression    Depression    GERD (gastroesophageal reflux disease)    Hiatal hernia    Hypercholesteremia    Hypothyroidism    Iron deficiency    ITP (idiopathic thrombocytopenic purpura)    FOLLOWED BR DR Grayland Ormond   LBBB (left bundle branch block)    Osteoarthritis    Osteoporosis    Restless legs     PAST SURGICAL HISTORY: Past Surgical History:  Procedure Laterality Date   CARPAL TUNNEL RELEASE     ESOPHAGOGASTRODUODENOSCOPY (EGD) WITH PROPOFOL N/A 09/10/2015   Procedure: ESOPHAGOGASTRODUODENOSCOPY (EGD) WITH PROPOFOL;  Surgeon: Lollie Sails, MD;  Location[facility];  Service: Endoscopy;  Laterality: N/A;   HIP PINNING,CANNULATED Left 10/31/2020   Procedure: CANNULATED HIP PINNING;  Surgeon: Leim Fabry, MD;  Location: [facility] OR[facility];  Service: Orthopedics;  Laterality: Left;   HIP PINNING,CANNULATED Right 07/24/2021   Procedure: CANNULATED HIP PINNING;  Surgeon: Leandrew Koyanagi, MD;  Location: [place];  Service: Orthopedics;  Laterality: Right;   IRRIGATION AND DEBRIDEMENT HEMATOMA Right 07/13/2018   Procedure: IRRIGATION AND DEBRIDEMENT HEMATOMA-RIGHT SHIN;  Surgeon: Herbert Pun, MD;  Location: [facility];  Service: General;  Laterality: Right;   KYPHOPLASTY N/A 05/02/2019   Procedure: L1 KYPHOPLASTY;  Surgeon: Hessie Knows, MD;  Location: [facility];  Service: Orthopedics;  Laterality: N/A;   LUMBAR DISC SURGERY     REVERSE SHOULDER ARTHROPLASTY Left 06/25/2020   Procedure: REVERSE SHOULDER ARTHROPLASTY;  Surgeon: Corky Mull, MD;  Location: [facility] ORS;  Service: Orthopedics;  Laterality: Left;   SPLENECTOMY, PARTIAL     TOTAL  ABDOMINAL HYSTERECTOMY      FAMILY HISTORY Family History  Problem Relation Age of Onset   Stroke Mother     GYNECOLOGIC HISTORY:  No LMP recorded. Patient has had a hysterectomy.     ADVANCED DIRECTIVES:    HEALTH MAINTENANCE: Social History   Tobacco Use   Smoking status: Never   Smokeless tobacco: Never  Vaping Use   Vaping Use: Never used  Substance Use Topics   Alcohol use: No    Alcohol/week: 0.0 standard drinks   Drug use: No     Allergies  Allergen Reactions   Aspirin Other (See Comments)    Upset stomach   Diazepam Itching    sneezing   Tetanus Toxoids Swelling    Localized superficial swelling of skin   Morphine Itching and Rash    Current Outpatient Medications  Medication Sig Dispense Refill   acetaminophen (TYLENOL) 325 MG tablet Take 2 tablets (650 mg total) by mouth every 6 (six) hours as needed for mild pain (or Fever >/= 101). 100 tablet 0   ALPRAZolam (XANAX) 0.5 MG tablet Take 1 tablet (0.5 mg total) by mouth 3 (three) times daily as needed for anxiety. 10 tablet 0   amLODipine (NORVASC) 10 MG tablet Take 1 tablet (10 mg total) by mouth daily.     busPIRone (BUSPAR) 15 MG tablet Take 7.5 mg by mouth 2 (two) times daily.     Cholecalciferol (VITAMIN D) 50 MCG (2000 UT) tablet Take 2,000 Units by mouth daily.     citalopram (CELEXA) 20 MG tablet Take 1 tablet (20 mg total) by mouth every morning.     Cyanocobalamin 1000 MCG TBCR Take 1,000 mcg by mouth daily.      diclofenac Sodium (VOLTAREN) 1 % GEL Apply 2 g topically in the morning and at bedtime. Both knees     Fluticasone-Salmeterol (ADVAIR) 250-50 MCG/DOSE AEPB Inhale 1 puff into the lungs 2 (two) times daily as needed (shortness of breath).     HYDROcodone-acetaminophen (NORCO) 7.5-325 MG tablet Take 1-2 tablets by mouth 3 (three) times daily as needed for moderate pain. 30 tablet 0   iron polysaccharides (NIFEREX) 150 MG capsule Take 1 capsule (150 mg total) by mouth daily.      lamoTRIgine (LAMICTAL) 25 MG tablet Take 50 mg by mouth daily.     levothyroxine (SYNTHROID, LEVOTHROID) 50 MCG tablet Take 50 mcg by mouth daily before breakfast. Take 30 to 60 minutes before breakfast.     loratadine (CLARITIN) 10 MG tablet Take 1 tablet (10 mg total) by mouth daily.     memantine (NAMENDA) 10 MG tablet Take 10 mg by mouth 2 (two) times daily.     methocarbamol (ROBAXIN) 500 MG tablet Take 1 tablet (500 mg total) by mouth every 6 (six) hours as needed for muscle spasms. 30 tablet 0   Multiple Vitamin (MULTI-VITAMIN) tablet Take 1 tablet by mouth daily.     oxybutynin (DITROPAN-XL) 5 MG 24 hr tablet Take 5 mg by mouth at bedtime.     pantoprazole (PROTONIX) 40 MG tablet Take 40 mg by mouth daily.     polyethylene glycol (MIRALAX / GLYCOLAX) 17 g packet Take 17 g by  mouth daily as needed for moderate constipation. 14 each 0   rosuvastatin (CRESTOR) 5 MG tablet Take 5 mg by mouth every evening.     senna-docusate (SENOKOT-S) 8.6-50 MG tablet Take 1 tablet by mouth 2 (two) times daily. 20 tablet 0   No current facility-administered medications for this visit.    OBJECTIVE: BP (!) 117/57   Pulse 62   Temp (!) 96.4 F (35.8 C) (Tympanic)   Resp 16   Wt 125 lb (56.7 kg)   SpO2 100%   BMI 19.58 kg/m    Body mass index is 19.58 kg/m.    ECOG FS:2 - Symptomatic, <50% confined to bed  General: Well-developed, well-nourished, no acute distress. Eyes: Pink conjunctiva, anicteric sclera. HEENT: Normocephalic, moist mucous membranes. Lungs: No audible wheezing or coughing. Heart: Regular rate and rhythm. Abdomen: Soft, nontender, no obvious distention. Musculoskeletal: No edema, cyanosis, or clubbing. Neuro: Confused. Cranial nerves grossly intact. Skin: No rashes or petechiae noted. Psych: Flat affect.   LAB RESULTS:  Appointment on 09/17/2021  Component Date Value Ref Range Status   WBC 09/17/2021 8.0  4.0 - 10.5 K/uL Final   RBC 09/17/2021 4.21  3.87 - 5.11 MIL/uL  Final   Hemoglobin 09/17/2021 12.8  12.0 - 15.0 g/dL Final   HCT 09/17/2021 39.8  36.0 - 46.0 % Final   MCV 09/17/2021 94.5  80.0 - 100.0 fL Final   MCH 09/17/2021 30.4  26.0 - 34.0 pg Final   MCHC 09/17/2021 32.2  30.0 - 36.0 g/dL Final   RDW 09/17/2021 15.9 (H)  11.5 - 15.5 % Final   Platelets 09/17/2021 11 (LL)  150 - 400 K/uL Final   Comment: SPECIMEN CHECKED FOR CLOTS PLATELET COUNT CONFIRMED BY SMEAR THIS CRITICAL RESULT HAS VERIFIED AND BEEN CALLED TO DR Grayland Ormond BY KIM ROOS ON 12 07 2022 AT 0946, AND HAS BEEN READ BACK.     nRBC 09/17/2021 0.0  0.0 - 0.2 % Final   Performed at [facility], [address] [place], [place] [address]    STUDIES: No results found.  ASSESSMENT:  Chronic refractory ITP  PLAN:   1. Chronic refractory ITP: Patient had a poor response to Prednisone, WinRho, and Rituxan. She is status post splenectomy in 2007.  She received IVIG and Nplate while in the hospital with significant improvement of her platelet count.   If Nplate no longer was effective, could retry IVIG at a future date.  Patient is currently receiving the max dose of Nplate at 10 mcg/kg.  Can also consider Promacta 12.5mg  or Tavalisse 100 mg BID if needed.  Patient's blood count is 11 today.  Proceed with Nplate as ordered.  Return to clinic in 4 weeks for further evaluation and continuation of treatment.   2. Weakness and fatigue: Chronic and unchanged.   3.  Dementia: Memory is worse today, possibly secondary to underlying acute illness.  Patient is now on a full-time memory unit.  Her husband is no longer involved in her care. 4.  Fractured hip: Resolved. 5.  Cough/nausea: Continue symptomatic treatment at patient's facility.  Patient expressed understanding and was in agreement with this plan. She also understands that She can call clinic at any time with any questions, concerns, or complaints.    Lloyd Huger, MD   09/17/2021 3:33 PM

## 2021-09-17 ENCOUNTER — Inpatient Hospital Stay: Payer: Medicare Other

## 2021-09-17 ENCOUNTER — Inpatient Hospital Stay: Payer: Medicare Other | Attending: Oncology | Admitting: Oncology

## 2021-09-17 ENCOUNTER — Other Ambulatory Visit: Payer: Self-pay

## 2021-09-17 VITALS — BP 117/57 | HR 62 | Temp 96.4°F | Resp 16 | Wt 125.0 lb

## 2021-09-17 DIAGNOSIS — D693 Immune thrombocytopenic purpura: Secondary | ICD-10-CM

## 2021-09-17 LAB — CBC
HCT: 39.8 % (ref 36.0–46.0)
Hemoglobin: 12.8 g/dL (ref 12.0–15.0)
MCH: 30.4 pg (ref 26.0–34.0)
MCHC: 32.2 g/dL (ref 30.0–36.0)
MCV: 94.5 fL (ref 80.0–100.0)
Platelets: 11 10*3/uL — CL (ref 150–400)
RBC: 4.21 MIL/uL (ref 3.87–5.11)
RDW: 15.9 % — ABNORMAL HIGH (ref 11.5–15.5)
WBC: 8 10*3/uL (ref 4.0–10.5)
nRBC: 0 % (ref 0.0–0.2)

## 2021-09-17 MED ORDER — ROMIPLOSTIM INJECTION 500 MCG
560.0000 ug | Freq: Once | SUBCUTANEOUS | Status: AC
  Administered 2021-09-17: 560 ug via SUBCUTANEOUS
  Filled 2021-09-17: qty 0.25

## 2021-09-17 NOTE — Progress Notes (Signed)
Pt c/o generalized malaise, headache and non productive cough. Denies fevers, nausea/vomiting. Per caregiver, pt appears more lethargic than normal.

## 2021-09-25 ENCOUNTER — Other Ambulatory Visit: Payer: Self-pay

## 2021-09-25 ENCOUNTER — Emergency Department (HOSPITAL_COMMUNITY)
Admission: EM | Admit: 2021-09-25 | Discharge: 2021-09-26 | Disposition: A | Discharge status: Home / Self Care | Payer: Medicare Other | Attending: Emergency Medicine | Admitting: Emergency Medicine

## 2021-09-25 ENCOUNTER — Emergency Department (HOSPITAL_COMMUNITY): Payer: Medicare Other

## 2021-09-25 ENCOUNTER — Encounter: Payer: Self-pay | Admitting: Oncology

## 2021-09-25 ENCOUNTER — Encounter (HOSPITAL_COMMUNITY): Payer: Self-pay

## 2021-09-25 DIAGNOSIS — M79604 Pain in right leg: Secondary | ICD-10-CM | POA: Insufficient documentation

## 2021-09-25 DIAGNOSIS — J45909 Unspecified asthma, uncomplicated: Secondary | ICD-10-CM | POA: Insufficient documentation

## 2021-09-25 DIAGNOSIS — R52 Pain, unspecified: Secondary | ICD-10-CM | POA: Diagnosis not present

## 2021-09-25 DIAGNOSIS — R519 Headache, unspecified: Secondary | ICD-10-CM | POA: Insufficient documentation

## 2021-09-25 DIAGNOSIS — F0390 Unspecified dementia without behavioral disturbance: Secondary | ICD-10-CM | POA: Diagnosis not present

## 2021-09-25 DIAGNOSIS — W050XXA Fall from non-moving wheelchair, initial encounter: Secondary | ICD-10-CM | POA: Diagnosis not present

## 2021-09-25 DIAGNOSIS — M25562 Pain in left knee: Secondary | ICD-10-CM | POA: Diagnosis not present

## 2021-09-25 DIAGNOSIS — Z96612 Presence of left artificial shoulder joint: Secondary | ICD-10-CM | POA: Diagnosis not present

## 2021-09-25 DIAGNOSIS — E039 Hypothyroidism, unspecified: Secondary | ICD-10-CM | POA: Diagnosis not present

## 2021-09-25 DIAGNOSIS — M79605 Pain in left leg: Secondary | ICD-10-CM | POA: Insufficient documentation

## 2021-09-25 DIAGNOSIS — K219 Gastro-esophageal reflux disease without esophagitis: Secondary | ICD-10-CM | POA: Diagnosis not present

## 2021-09-25 DIAGNOSIS — M25561 Pain in right knee: Secondary | ICD-10-CM | POA: Diagnosis not present

## 2021-09-25 DIAGNOSIS — J449 Chronic obstructive pulmonary disease, unspecified: Secondary | ICD-10-CM | POA: Diagnosis not present

## 2021-09-25 DIAGNOSIS — F32A Depression, unspecified: Secondary | ICD-10-CM | POA: Diagnosis not present

## 2021-09-25 DIAGNOSIS — Z79899 Other long term (current) drug therapy: Secondary | ICD-10-CM | POA: Insufficient documentation

## 2021-09-25 DIAGNOSIS — E440 Moderate protein-calorie malnutrition: Secondary | ICD-10-CM | POA: Diagnosis not present

## 2021-09-25 DIAGNOSIS — G629 Polyneuropathy, unspecified: Secondary | ICD-10-CM | POA: Diagnosis not present

## 2021-09-25 DIAGNOSIS — W19XXXA Unspecified fall, initial encounter: Secondary | ICD-10-CM

## 2021-09-25 DIAGNOSIS — Y92129 Unspecified place in nursing home as the place of occurrence of the external cause: Secondary | ICD-10-CM | POA: Diagnosis not present

## 2021-09-25 DIAGNOSIS — F419 Anxiety disorder, unspecified: Secondary | ICD-10-CM | POA: Diagnosis not present

## 2021-09-25 DIAGNOSIS — R531 Weakness: Secondary | ICD-10-CM | POA: Diagnosis not present

## 2021-09-25 DIAGNOSIS — I10 Essential (primary) hypertension: Secondary | ICD-10-CM | POA: Diagnosis not present

## 2021-09-25 NOTE — ED Notes (Addendum)
Tried to ambulate patient, after helping her sit on the side of the bed pt states she is too weak to stand. Was unable to get her to stand fully before she sat back down.

## 2021-09-25 NOTE — ED Provider Notes (Signed)
[facility] EMERGENCY DEPARTMENT Provider Note   CSN: [id number] Arrival date & time: 09/25/21  1818  LEVEL 5 CAVEAT - DEMENTIA   History Chief Complaint  Patient presents with   Fall    Ariana [name] is a 87 y.o. female.  HPI 87 year old female presents after falling out of a wheelchair.  History is from the nurse report only as the patient has dementia and cannot tell me what is going on and EMS is no longer present.  Apparently she was trying to transfer from the wheelchair to the bed.  History is otherwise very limited, level 5 caveat.  Patient endorses multiple areas of pain.  This includes headache and both of her knees/legs.  Past Medical History:  Diagnosis Date   Alzheimer disease ([place])    Anemia    Anxiety    Asthma    WELL CONTROLLED   Chronic back pain    Cognitive communication deficit    COPD (chronic obstructive pulmonary disease) (HCC)    Depression    Depression    GERD (gastroesophageal reflux disease)    Hiatal hernia    Hypercholesteremia    Hypothyroidism    Iron deficiency    ITP (idiopathic thrombocytopenic purpura)    FOLLOWED BR DR Grayland Ormond   LBBB (left bundle branch block)    Osteoarthritis    Osteoporosis    Restless legs     Patient Active Problem List   Diagnosis Date Noted   Protein-calorie malnutrition, severe 07/24/2021   Hypokalemia 07/22/2021   Hyperglycemia 07/22/2021   Prolonged QT interval 07/22/2021   Fracture of femoral neck, right ([place]) 07/21/2021   Cerebral hemorrhage ([place]) 07/14/2021   Subarachnoid hemorrhage ([place]) 07/13/2021   Hiatal hernia 04/09/2021   GERD (gastroesophageal reflux disease) 04/09/2021   Normocytic anemia    Skin tear of right hand without complication    Fall    Malnutrition of moderate degree 01/09/2021   Palliative care encounter    Closed displaced fracture of left femoral neck ([place]) 10/30/2020   Rectal bleeding 08/18/2020   Leukocytosis 08/18/2020   Status post reverse total shoulder  replacement, left 06/25/2020   Rotator cuff arthropathy, left 06/24/2020   Failure to thrive in adult 02/16/2020   Thrombocytopenia ([place]) 02/15/2020   Pneumonia 07/10/2019   Laceration of left lower leg 04/10/2019   Chronic pain disorder 01/11/2019   OSA (obstructive sleep apnea) 09/28/2018   Ulcer of lower limb, right, limited to breakdown of skin ([place]) 07/22/2018   Hematoma of lower limb, right, initial encounter 07/18/2018   Traumatic hematoma of right lower leg 07/12/2018   Asthma without status asthmaticus 11/25/2016   Late onset Alzheimer's disease without behavioral disturbance ([place]) 12/30/2015   Back muscle spasm 09/09/2015   Clinical depression 04/12/2015   Cellulitis 04/12/2015   Acute ITP ([place]) 03/15/2015   Chest pain 10/22/2014   Breathlessness on exertion 10/22/2014   Osteopenia 10/18/2014   Essential hypertension 06/28/2014   HLD (hyperlipidemia) 06/28/2014   Idiopathic thrombocythemia ([place]) 06/28/2014   Acquired hypothyroidism 06/04/2014   Depressive disorder 06/04/2014   Bursitis, trochanteric 03/27/2014   DDD (degenerative disc disease), lumbar 02/27/2014   Neuritis or radiculitis due to rupture of lumbar intervertebral disc 02/27/2014   Lumbar radiculitis 02/27/2014    Past Surgical History:  Procedure Laterality Date   CARPAL TUNNEL RELEASE     ESOPHAGOGASTRODUODENOSCOPY (EGD) WITH PROPOFOL N/A 09/10/2015   Procedure: ESOPHAGOGASTRODUODENOSCOPY (EGD) WITH PROPOFOL;  Surgeon: Lollie Sails, MD;  Location: [facility] ENDOSCOPY;  Service: Endoscopy;  Laterality: N/A;   HIP PINNING,CANNULATED Left 10/31/2020   Procedure: CANNULATED HIP PINNING;  Surgeon: Leim Fabry, MD;  Location: [facility] ORS;  Service: Orthopedics;  Laterality: Left;   HIP PINNING,CANNULATED Right 07/24/2021   Procedure: CANNULATED HIP PINNING;  Surgeon: Leandrew Koyanagi, MD;  Location: [place];  Service: Orthopedics;  Laterality: Right;   IRRIGATION AND DEBRIDEMENT HEMATOMA Right 07/13/2018    Procedure: IRRIGATION AND DEBRIDEMENT HEMATOMA-RIGHT SHIN;  Surgeon: Herbert Pun, MD;  Location: [facility] ORS;  Service: General;  Laterality: Right;   KYPHOPLASTY N/A 05/02/2019   Procedure: L1 KYPHOPLASTY;  Surgeon: Hessie Knows, MD;  Location: [facility] ORS;  Service: Orthopedics;  Laterality: N/A;   LUMBAR DISC SURGERY     REVERSE SHOULDER ARTHROPLASTY Left 06/25/2020   Procedure: REVERSE SHOULDER ARTHROPLASTY;  Surgeon: Corky Mull, MD;  Location: [facility] ORS;  Service: Orthopedics;  Laterality: Left;   SPLENECTOMY, PARTIAL     TOTAL ABDOMINAL HYSTERECTOMY       OB History   No obstetric history on file.     Family History  Problem Relation Age of Onset   Stroke Mother     Social History   Tobacco Use   Smoking status: Never   Smokeless tobacco: Never  Vaping Use   Vaping Use: Never used  Substance Use Topics   Alcohol use: No    Alcohol/week: 0.0 standard drinks   Drug use: No    Home Medications Prior to Admission medications   Medication Sig Start Date End Date Taking? Authorizing Provider  oxyCODONE (OXY IR/ROXICODONE) 5 MG immediate release tablet Take by mouth. 09/16/21  Yes [provider]  pantoprazole (PROTONIX) 40 MG tablet Take by mouth. 08/25/21  Yes [provider]  acetaminophen (TYLENOL) 325 MG tablet Take 2 tablets (650 mg total) by mouth every 6 (six) hours as needed for mild pain (or Fever >/= 101). 07/02/15   Idelle Crouch, MD  ALPRAZolam Duanne Moron) 0.5 MG tablet Take 1 tablet (0.5 mg total) by mouth 3 (three) times daily as needed for anxiety. 07/30/21   Aline August, MD  amLODipine (NORVASC) 10 MG tablet Take 1 tablet (10 mg total) by mouth daily. 01/17/21   Ezekiel Slocumb, DO  busPIRone (BUSPAR) 15 MG tablet Take 7.5 mg by mouth 2 (two) times daily. 07/02/21   [provider]  Cholecalciferol (VITAMIN D) 50 MCG (2000 UT) tablet Take 2,000 Units by mouth daily.    [provider]  citalopram (CELEXA) 20 MG tablet  Take 1 tablet (20 mg total) by mouth every morning. 02/06/21   Johnson, Clanford L, MD  Cyanocobalamin 1000 MCG TBCR Take 1,000 mcg by mouth daily.     [provider]  diclofenac Sodium (VOLTAREN) 1 % GEL Apply 2 g topically in the morning and at bedtime. Both knees    [provider]  Fluticasone-Salmeterol (ADVAIR) 250-50 MCG/DOSE AEPB Inhale 1 puff into the lungs 2 (two) times daily as needed (shortness of breath).    [provider]  HYDROcodone-acetaminophen (NORCO) 7.5-325 MG tablet Take 1-2 tablets by mouth 3 (three) times daily as needed for moderate pain. 07/24/21   Leandrew Koyanagi, MD  iron polysaccharides (NIFEREX) 150 MG capsule Take 1 capsule (150 mg total) by mouth daily. 01/17/21   Ezekiel Slocumb, DO  lamoTRIgine (LAMICTAL) 25 MG tablet Take 50 mg by mouth daily. 07/16/21   [provider]  levothyroxine (SYNTHROID, LEVOTHROID) 50 MCG tablet Take 50 mcg by mouth daily before breakfast. Take 30  to 60 minutes before breakfast.    [provider]  loratadine (CLARITIN) 10 MG tablet Take 1 tablet (10 mg total) by mouth daily. 01/17/21   Ezekiel Slocumb, DO  memantine (NAMENDA) 10 MG tablet Take 10 mg by mouth 2 (two) times daily.    [provider]  methocarbamol (ROBAXIN) 500 MG tablet Take 1 tablet (500 mg total) by mouth every 6 (six) hours as needed for muscle spasms. 07/30/21   Aline August, MD  Multiple Vitamin (MULTI-VITAMIN) tablet Take 1 tablet by mouth daily.    [provider]  oxybutynin (DITROPAN-XL) 5 MG 24 hr tablet Take 5 mg by mouth at bedtime.    [provider]  pantoprazole (PROTONIX) 40 MG tablet Take 40 mg by mouth daily. 07/24/15   [provider]  polyethylene glycol (MIRALAX / GLYCOLAX) 17 g packet Take 17 g by mouth daily as needed for moderate constipation. 07/30/21   Aline August, MD  rosuvastatin (CRESTOR) 5 MG tablet Take 5 mg by mouth every evening.    [provider]   senna-docusate (SENOKOT-S) 8.6-50 MG tablet Take 1 tablet by mouth 2 (two) times daily. 07/30/21   Aline August, MD    Allergies    Aspirin, Diazepam, Tetanus toxoids, and Morphine  Review of Systems   Review of Systems  Unable to perform ROS: Dementia   Physical Exam Updated Vital Signs BP 117/67 (BP Location: Right Arm)    Pulse 76    Temp 98.3 F (36.8 C) (Oral)    Resp 16    SpO2 96%   Physical Exam Vitals and nursing note reviewed.  Constitutional:      Appearance: She is well-developed.  HENT:     Head: Normocephalic and atraumatic.     Right Ear: External ear normal.     Left Ear: External ear normal.     Nose: Nose normal.  Eyes:     General:        Right eye: No discharge.        Left eye: No discharge.  Cardiovascular:     Rate and Rhythm: Normal rate and regular rhythm.     Pulses:          Dorsalis pedis pulses are 2+ on the right side and 2+ on the left side.     Heart sounds: Normal heart sounds.  Pulmonary:     Effort: Pulmonary effort is normal.     Breath sounds: Normal breath sounds.  Abdominal:     General: There is no distension.     Palpations: Abdomen is soft.     Tenderness: There is no abdominal tenderness.  Musculoskeletal:     Cervical back: Tenderness present.     Comments: Some tenderness along the cervical neck.  However there is no tenderness in the thoracic or lumbar back.  No tenderness or decreased range of motion in major joints in the upper extremities bilaterally. There is pain when ranging her hips bilaterally as well as her knees bilaterally.  Seems like the knees are primarily the area of tenderness/pain but is hard to localize.  She is diffusely tender.  However after I am done examining her she then spontaneously moves her hips and knees without difficulty or pain.  Skin:    General: Skin is warm and dry.  Neurological:     Mental Status: She is alert.     Comments: Moves all 4 extremities spontaneously.  Psychiatric:  Mood and Affect: Mood is not anxious.    ED Results / Procedures / Treatments   Labs (all labs ordered are listed, but only abnormal results are displayed) Labs Reviewed - No data to display  EKG None  Radiology CT Head Wo Contrast  Result Date: 09/25/2021 CLINICAL DATA:  [place] out of wheelchair trying to transfer to bed, history dementia, COPD, hypertension EXAM: CT HEAD WITHOUT CONTRAST CT CERVICAL SPINE WITHOUT CONTRAST TECHNIQUE: Multidetector CT imaging of the head and cervical spine was performed following the standard protocol without intravenous contrast. Multiplanar CT image reconstructions of the cervical spine were also generated. COMPARISON:  08/24/2021 FINDINGS: CT HEAD FINDINGS Brain: Generalized atrophy. Normal ventricular morphology. No midline shift or mass effect. Small vessel chronic ischemic changes of deep cerebral white matter. Small infarct medial LEFT frontal lobe adjacent to the falx, at a site of prior intraparenchymal hemorrhage on earlier studies from October 2022. No acute intracranial hemorrhage, mass lesion, or additional infarct. No extra-axial fluid collections. Vascular: Atherosclerotic calcification of internal carotid arteries bilaterally at skull base Skull: Demineralized but intact Sinuses/Orbits: Clear Other: N/A CT CERVICAL SPINE FINDINGS Alignment: Mild chronic anterolisthesis at C7-T1 and retrolisthesis at C5-C6. Remaining alignments normal. Skull base and vertebrae: Osseous demineralization. Skull base intact. Vertebral body heights maintained. Multilevel facet degenerative changes. Diffuse disc space narrowing and endplate spur formation in cervical spine. Partially calcified pannus adjacent to [facility] toy process. No acute fracture, additional subluxation, or bone destruction. Soft tissues and spinal canal: Prevertebral soft tissues normal thickness. Atherosclerotic calcifications of the carotid bifurcations bilaterally. Disc levels:  No specific abnormalities  Upper chest: Lung apices clear Other: N/A IMPRESSION: Atrophy with small vessel chronic ischemic changes of deep cerebral white matter. Old LEFT frontal lobe infarct. No acute intracranial abnormalities. Multilevel degenerative disc and facet disease changes of the cervical spine. No acute cervical spine abnormalities. Electronically Signed   By: Lavonia Dana M.D.   On: 09/25/2021 20:25   CT Cervical Spine Wo Contrast  Result Date: 09/25/2021 CLINICAL DATA:  [place] out of wheelchair trying to transfer to bed, history dementia, COPD, hypertension EXAM: CT HEAD WITHOUT CONTRAST CT CERVICAL SPINE WITHOUT CONTRAST TECHNIQUE: Multidetector CT imaging of the head and cervical spine was performed following the standard protocol without intravenous contrast. Multiplanar CT image reconstructions of the cervical spine were also generated. COMPARISON:  08/24/2021 FINDINGS: CT HEAD FINDINGS Brain: Generalized atrophy. Normal ventricular morphology. No midline shift or mass effect. Small vessel chronic ischemic changes of deep cerebral white matter. Small infarct medial LEFT frontal lobe adjacent to the falx, at a site of prior intraparenchymal hemorrhage on earlier studies from October 2022. No acute intracranial hemorrhage, mass lesion, or additional infarct. No extra-axial fluid collections. Vascular: Atherosclerotic calcification of internal carotid arteries bilaterally at skull base Skull: Demineralized but intact Sinuses/Orbits: Clear Other: N/A CT CERVICAL SPINE FINDINGS Alignment: Mild chronic anterolisthesis at C7-T1 and retrolisthesis at C5-C6. Remaining alignments normal. Skull base and vertebrae: Osseous demineralization. Skull base intact. Vertebral body heights maintained. Multilevel facet degenerative changes. Diffuse disc space narrowing and endplate spur formation in cervical spine. Partially calcified pannus adjacent to [facility] toy process. No acute fracture, additional subluxation, or bone destruction. Soft  tissues and spinal canal: Prevertebral soft tissues normal thickness. Atherosclerotic calcifications of the carotid bifurcations bilaterally. Disc levels:  No specific abnormalities Upper chest: Lung apices clear Other: N/A IMPRESSION: Atrophy with small vessel chronic ischemic changes of deep cerebral white matter. Old LEFT frontal lobe infarct. No acute intracranial abnormalities. Multilevel  degenerative disc and facet disease changes of the cervical spine. No acute cervical spine abnormalities. Electronically Signed   By: Lavonia Dana M.D.   On: 09/25/2021 20:25   DG Knee Complete 4 Views Left  Result Date: 09/25/2021 CLINICAL DATA:  Fall and trauma to the left knee. EXAM: LEFT KNEE - COMPLETE 4+ VIEW COMPARISON:  Right knee radiograph dated 09/25/2021. FINDINGS: No acute fracture or dislocation. The bones are osteopenic. No significant joint effusion. The soft tissues are unremarkable. IMPRESSION: No acute fracture or dislocation. Electronically Signed   By: Anner Crete M.D.   On: 09/25/2021 20:48   DG Knee Complete 4 Views Right  Result Date: 09/25/2021 CLINICAL DATA:  Right-sided knee pain after fall EXAM: RIGHT KNEE - COMPLETE 4+ VIEW COMPARISON:  None. FINDINGS: No fracture or malalignment. Mild tricompartment arthritis. No sizable effusion. Joint space calcifications. Multiple skin fold artifact over the superior patella. IMPRESSION: 1. No acute osseous abnormality 2. Mild tricompartment arthritis.  Chondrocalcinosis. Electronically Signed   By: Donavan Foil M.D.   On: 09/25/2021 20:52   DG HIPS BILAT WITH PELVIS MIN 5 VIEWS  Result Date: 09/25/2021 CLINICAL DATA:  Fall EXAM: DG HIP (WITH OR WITHOUT PELVIS) 5+V BILAT COMPARISON:  09/10/2021, 07/30/2021 FINDINGS: Surgical hardware in the lumbar spine. The SI joints are non widened. The pubic symphysis and rami appear intact. Right hip: 3 threaded screw fixation of right subcapital femoral neck fracture without change in alignment. Left  hip: 3 screw fixation of the left femur with chronic AVN/femoral head collapse on the left. No definite acute displaced fracture is seen. IMPRESSION: Stable postsurgical changes of both proximal femurs without definitive acute osseous abnormality Electronically Signed   By: Donavan Foil M.D.   On: 09/25/2021 20:49    Procedures Procedures   Medications Ordered in ED Medications - No data to display  ED Course  I have reviewed the triage vital signs and the nursing notes.  Pertinent labs & imaging results that were available during my care of the patient were reviewed by me and considered in my medical decision making (see chart for details).    MDM Rules/Calculators/A&P                         No apparent injuries after fall.  Patient freely moves her extremities though does not seem to be able to get up and walk when prompted by the nurse.  It is unclear if she actually walks at her facility.  At this point, I doubt occult fracture and I think she is stable for discharge back to her facility.  Falls appear to be a frequent issue for her and I do not think labs are needed with normal vital signs and frequent fall history.    Final Clinical Impression(s) / ED Diagnoses Final diagnoses:  Fall, initial encounter    Rx / DC Orders ED Discharge Orders     None        Sherwood Gambler, MD 09/25/21 [id number]

## 2021-09-25 NOTE — ED Triage Notes (Signed)
Pt bib ems from [facility] for fall out of wheel chair.  reportsR arm pain, bilateral knee and hip pain.  No deformity noted by ems.  Vss.  Resp even and unlabored.  Per ems pt was trying to transfer from wc to bed.  Pt has hx of dementia and is not a reliable historian.

## 2021-09-26 ENCOUNTER — Encounter: Payer: Self-pay | Admitting: Oncology

## 2021-09-26 DIAGNOSIS — R404 Transient alteration of awareness: Secondary | ICD-10-CM | POA: Diagnosis not present

## 2021-09-26 DIAGNOSIS — R531 Weakness: Secondary | ICD-10-CM | POA: Diagnosis not present

## 2021-09-26 DIAGNOSIS — Z7401 Bed confinement status: Secondary | ICD-10-CM | POA: Diagnosis not present

## 2021-09-26 DIAGNOSIS — R279 Unspecified lack of coordination: Secondary | ICD-10-CM | POA: Diagnosis not present

## 2021-09-29 DIAGNOSIS — M25552 Pain in left hip: Secondary | ICD-10-CM | POA: Diagnosis not present

## 2021-09-29 DIAGNOSIS — G629 Polyneuropathy, unspecified: Secondary | ICD-10-CM | POA: Diagnosis not present

## 2021-09-29 DIAGNOSIS — Z79899 Other long term (current) drug therapy: Secondary | ICD-10-CM | POA: Diagnosis not present

## 2021-09-29 DIAGNOSIS — R52 Pain, unspecified: Secondary | ICD-10-CM | POA: Diagnosis not present

## 2021-09-29 DIAGNOSIS — I10 Essential (primary) hypertension: Secondary | ICD-10-CM | POA: Diagnosis not present

## 2021-09-29 DIAGNOSIS — F419 Anxiety disorder, unspecified: Secondary | ICD-10-CM | POA: Diagnosis not present

## 2021-09-29 DIAGNOSIS — F32A Depression, unspecified: Secondary | ICD-10-CM | POA: Diagnosis not present

## 2021-09-29 DIAGNOSIS — R5381 Other malaise: Secondary | ICD-10-CM | POA: Diagnosis not present

## 2021-10-01 DIAGNOSIS — F419 Anxiety disorder, unspecified: Secondary | ICD-10-CM | POA: Diagnosis not present

## 2021-10-01 DIAGNOSIS — F32A Depression, unspecified: Secondary | ICD-10-CM | POA: Diagnosis not present

## 2021-10-01 DIAGNOSIS — G629 Polyneuropathy, unspecified: Secondary | ICD-10-CM | POA: Diagnosis not present

## 2021-10-01 DIAGNOSIS — I10 Essential (primary) hypertension: Secondary | ICD-10-CM | POA: Diagnosis not present

## 2021-10-01 DIAGNOSIS — E039 Hypothyroidism, unspecified: Secondary | ICD-10-CM | POA: Diagnosis not present

## 2021-10-01 DIAGNOSIS — Z79899 Other long term (current) drug therapy: Secondary | ICD-10-CM | POA: Diagnosis not present

## 2021-10-01 DIAGNOSIS — J441 Chronic obstructive pulmonary disease with (acute) exacerbation: Secondary | ICD-10-CM | POA: Diagnosis not present

## 2021-10-08 ENCOUNTER — Other Ambulatory Visit: Payer: Self-pay | Admitting: *Deleted

## 2021-10-08 DIAGNOSIS — D693 Immune thrombocytopenic purpura: Secondary | ICD-10-CM

## 2021-10-09 NOTE — Progress Notes (Deleted)
[facility] [place]  Telephone:([phone]) [phone]  Fax:(336) [phone]     Ariana [name] DOB: Sep 01, 1933  MR#: [id number]  LKT#:[id number]  Patient Care Team: Ariana Renshaw, MD as PCP - General (Internal Medicine) Ariana Huger, MD as Consulting Physician (Oncology)   CHIEF COMPLAINT: Chronic refractory ITP  INTERVAL HISTORY: Patient returns to clinic today for repeat laboratory work, further evaluation, and continuation of Nplate.  Patient missed her appointment last week.  She has increased cough, nausea, and confusion today.  Patient is somewhat noncommunicative today and much of the history is given by her caretaker.  She has chronic weakness and fatigue, but denies any further falling. She denies any easy bleeding or bruising.  She has no neurologic complaints.  There is no reported fever.  She denies any chest pain, shortness of breath, or hemoptysis.  She denies any nausea, vomiting, constipation, or diarrhea.  She has no melena or hematochezia.  She has no urinary complaints.  Patient offers no further specific complaints today.  REVIEW OF SYSTEMS:   Review of Systems  Constitutional:  Positive for malaise/fatigue. Negative for fever and weight loss.  HENT:  Negative for congestion.   Respiratory:  Positive for cough. Negative for hemoptysis and shortness of breath.   Cardiovascular: Negative.  Negative for chest pain and leg swelling.  Gastrointestinal:  Positive for nausea. Negative for abdominal pain, blood in stool, diarrhea and melena.  Genitourinary: Negative.  Negative for dysuria and hematuria.  Musculoskeletal: Negative.  Negative for back pain, falls and joint pain.  Skin: Negative.  Negative for rash.  Neurological:  Positive for weakness. Negative for dizziness, sensory change, focal weakness and headaches.  Endo/Heme/Allergies:  Does not bruise/bleed easily.  Psychiatric/Behavioral:  Positive for memory loss. Negative for depression. The patient is not  nervous/anxious.    As per HPI. Otherwise, a complete review of systems is negative.  PAST MEDICAL HISTORY: Past Medical History:  Diagnosis Date   Alzheimer disease ([place])    Anemia    Anxiety    Asthma    WELL CONTROLLED   Chronic back pain    Cognitive communication deficit    COPD (chronic obstructive pulmonary disease) (HCC)    Depression    Depression    GERD (gastroesophageal reflux disease)    Hiatal hernia    Hypercholesteremia    Hypothyroidism    Iron deficiency    ITP (idiopathic thrombocytopenic purpura)    FOLLOWED BR DR Grayland Ormond   LBBB (left bundle branch block)    Osteoarthritis    Osteoporosis    Restless legs     PAST SURGICAL HISTORY: Past Surgical History:  Procedure Laterality Date   CARPAL TUNNEL RELEASE     ESOPHAGOGASTRODUODENOSCOPY (EGD) WITH PROPOFOL N/A 09/10/2015   Procedure: ESOPHAGOGASTRODUODENOSCOPY (EGD) WITH PROPOFOL;  Surgeon: Lollie Sails, MD;  Location: [facility] ENDOSCOPY;  Service: Endoscopy;  Laterality: N/A;   HIP PINNING,CANNULATED Left 10/31/2020   Procedure: CANNULATED HIP PINNING;  Surgeon: Leim Fabry, MD;  Location: [facility] ORS;  Service: Orthopedics;  Laterality: Left;   HIP PINNING,CANNULATED Right 07/24/2021   Procedure: CANNULATED HIP PINNING;  Surgeon: Leandrew Koyanagi, MD;  Location: [facility];  Service: Orthopedics;  Laterality: Right;   IRRIGATION AND DEBRIDEMENT HEMATOMA Right 07/13/2018   Procedure: IRRIGATION AND DEBRIDEMENT HEMATOMA-RIGHT SHIN;  Surgeon: Herbert Pun, MD;  Location: [facility];  Service: General;  Laterality: Right;   KYPHOPLASTY N/A 05/02/2019   Procedure: L1 KYPHOPLASTY;  Surgeon: Hessie Knows, MD;  Location: [facility];  Service: Orthopedics;  Laterality: N/A;   LUMBAR DISC SURGERY     REVERSE SHOULDER ARTHROPLASTY Left 06/25/2020   Procedure: REVERSE SHOULDER ARTHROPLASTY;  Surgeon: Corky Mull, MD;  Location: [facility];  Service: Orthopedics;  Laterality: Left;   SPLENECTOMY, PARTIAL     TOTAL  ABDOMINAL HYSTERECTOMY      FAMILY HISTORY Family History  Problem Relation Age of Onset   Stroke Mother     GYNECOLOGIC HISTORY:  No LMP recorded. Patient has had a hysterectomy.     ADVANCED DIRECTIVES:    HEALTH MAINTENANCE: Social History   Tobacco Use   Smoking status: Never   Smokeless tobacco: Never  Vaping Use   Vaping Use: Never used  Substance Use Topics   Alcohol use: No    Alcohol/week: 0.0 standard drinks   Drug use: No     Allergies  Allergen Reactions   Aspirin Other (See Comments)    Upset stomach   Diazepam Itching    sneezing   Tetanus Toxoids Swelling    Localized superficial swelling of skin   Morphine Itching and Rash    Current Outpatient Medications  Medication Sig Dispense Refill   acetaminophen (TYLENOL) 325 MG tablet Take 2 tablets (650 mg total) by mouth every 6 (six) hours as needed for mild pain (or Fever >/= 101). 100 tablet 0   ALPRAZolam (XANAX) 0.5 MG tablet Take 1 tablet (0.5 mg total) by mouth 3 (three) times daily as needed for anxiety. 10 tablet 0   amLODipine (NORVASC) 10 MG tablet Take 1 tablet (10 mg total) by mouth daily.     busPIRone (BUSPAR) 15 MG tablet Take 7.5 mg by mouth 2 (two) times daily.     Cholecalciferol (VITAMIN D) 50 MCG (2000 UT) tablet Take 2,000 Units by mouth daily.     citalopram (CELEXA) 20 MG tablet Take 1 tablet (20 mg total) by mouth every morning.     Cyanocobalamin 1000 MCG TBCR Take 1,000 mcg by mouth daily.      diclofenac Sodium (VOLTAREN) 1 % GEL Apply 2 g topically in the morning and at bedtime. Both knees     Fluticasone-Salmeterol (ADVAIR) 250-50 MCG/DOSE AEPB Inhale 1 puff into the lungs 2 (two) times daily as needed (shortness of breath).     HYDROcodone-acetaminophen (NORCO) 7.5-325 MG tablet Take 1-2 tablets by mouth 3 (three) times daily as needed for moderate pain. 30 tablet 0   iron polysaccharides (NIFEREX) 150 MG capsule Take 1 capsule (150 mg total) by mouth daily.      lamoTRIgine (LAMICTAL) 25 MG tablet Take 50 mg by mouth daily.     levothyroxine (SYNTHROID, LEVOTHROID) 50 MCG tablet Take 50 mcg by mouth daily before breakfast. Take 30 to 60 minutes before breakfast.     loratadine (CLARITIN) 10 MG tablet Take 1 tablet (10 mg total) by mouth daily.     memantine (NAMENDA) 10 MG tablet Take 10 mg by mouth 2 (two) times daily.     methocarbamol (ROBAXIN) 500 MG tablet Take 1 tablet (500 mg total) by mouth every 6 (six) hours as needed for muscle spasms. 30 tablet 0   Multiple Vitamin (MULTI-VITAMIN) tablet Take 1 tablet by mouth daily.     oxybutynin (DITROPAN-XL) 5 MG 24 hr tablet Take 5 mg by mouth at bedtime.     oxyCODONE (OXY IR/ROXICODONE) 5 MG immediate release tablet Take by mouth.     pantoprazole (PROTONIX) 40 MG tablet Take 40 mg by mouth daily.  pantoprazole (PROTONIX) 40 MG tablet Take by mouth.     polyethylene glycol (MIRALAX / GLYCOLAX) 17 g packet Take 17 g by mouth daily as needed for moderate constipation. 14 each 0   rosuvastatin (CRESTOR) 5 MG tablet Take 5 mg by mouth every evening.     senna-docusate (SENOKOT-S) 8.6-50 MG tablet Take 1 tablet by mouth 2 (two) times daily. 20 tablet 0   No current facility-administered medications for this visit.    OBJECTIVE: There were no vitals taken for this visit.   There is no height or weight on file to calculate BMI.    ECOG FS:2 - Symptomatic, <50% confined to bed  General: Well-developed, well-nourished, no acute distress. Eyes: Pink conjunctiva, anicteric sclera. HEENT: Normocephalic, moist mucous membranes. Lungs: No audible wheezing or coughing. Heart: Regular rate and rhythm. Abdomen: Soft, nontender, no obvious distention. Musculoskeletal: No edema, cyanosis, or clubbing. Neuro: Confused. Cranial nerves grossly intact. Skin: No rashes or petechiae noted. Psych: Flat affect.   LAB RESULTS:  No visits with results within 3 Day(s) from this visit.  Latest known visit with  results is:  Appointment on 09/17/2021  Component Date Value Ref Range Status   WBC 09/17/2021 8.0  4.0 - 10.5 K/uL Final   RBC 09/17/2021 4.21  3.87 - 5.11 MIL/uL Final   Hemoglobin 09/17/2021 12.8  12.0 - 15.0 g/dL Final   HCT 09/17/2021 39.8  36.0 - 46.0 % Final   MCV 09/17/2021 94.5  80.0 - 100.0 fL Final   MCH 09/17/2021 30.4  26.0 - 34.0 pg Final   MCHC 09/17/2021 32.2  30.0 - 36.0 g/dL Final   RDW 09/17/2021 15.9 (H)  11.5 - 15.5 % Final   Platelets 09/17/2021 11 (LL)  150 - 400 K/uL Final   Comment: SPECIMEN CHECKED FOR CLOTS PLATELET COUNT CONFIRMED BY SMEAR THIS CRITICAL RESULT HAS VERIFIED AND BEEN CALLED TO DR Grayland Ormond BY KIM ROOS ON 12 07 2022 AT 0946, AND HAS BEEN READ BACK.     nRBC 09/17/2021 0.0  0.0 - 0.2 % Final   Performed at [facility], [address] [place], [place] [address]    STUDIES: No results found.  ASSESSMENT:  Chronic refractory ITP  PLAN:   1. Chronic refractory ITP: Patient had a poor response to Prednisone, WinRho, and Rituxan. She is status post splenectomy in 2007.  She received IVIG and Nplate while in the hospital with significant improvement of her platelet count.   If Nplate no longer was effective, could retry IVIG at a future date.  Patient is currently receiving the max dose of Nplate at 10 mcg/kg.  Can also consider Promacta 12.5mg  or Tavalisse 100 mg BID if needed.  Patient's blood count is 11 today.  Proceed with Nplate as ordered.  Return to clinic in 4 weeks for further evaluation and continuation of treatment.   2. Weakness and fatigue: Chronic and unchanged.   3.  Dementia: Memory is worse today, possibly secondary to underlying acute illness.  Patient is now on a full-time memory unit.  Her husband is no longer involved in her care. 4.  Fractured hip: Resolved. 5.  Cough/nausea: Continue symptomatic treatment at patient's facility.  Patient expressed understanding and was in agreement with this plan. She also understands  that She can call clinic at any time with any questions, concerns, or complaints.    Ariana Huger, MD   10/09/2021 1:06 PM

## 2021-10-10 ENCOUNTER — Encounter: Payer: Self-pay | Admitting: Oncology

## 2021-10-11 ENCOUNTER — Encounter: Payer: Self-pay | Admitting: Oncology

## 2021-10-12 ENCOUNTER — Encounter: Payer: Self-pay | Admitting: Oncology

## 2021-10-12 DEATH — deceased

## 2021-10-13 ENCOUNTER — Encounter: Payer: Self-pay | Admitting: Oncology

## 2021-10-14 ENCOUNTER — Encounter: Payer: Self-pay | Admitting: Oncology

## 2021-10-15 ENCOUNTER — Inpatient Hospital Stay

## 2021-10-15 ENCOUNTER — Inpatient Hospital Stay: Admitting: Oncology

## 2021-10-15 ENCOUNTER — Encounter: Payer: Self-pay | Admitting: Oncology

## 2021-10-22 ENCOUNTER — Ambulatory Visit: Payer: Medicare HMO | Admitting: Orthopaedic Surgery
